# Patient Record
Sex: Male | Born: 1945 | Race: Black or African American | Hispanic: No | Smoking: Never smoker
Health system: Southern US, Community
[De-identification: ages and names within clinical notes are randomized; demographics above are authoritative.]

## PROBLEM LIST (undated history)

## (undated) DIAGNOSIS — I420 Dilated cardiomyopathy: Secondary | ICD-10-CM

## (undated) DIAGNOSIS — I471 Supraventricular tachycardia, unspecified: Secondary | ICD-10-CM

## (undated) DIAGNOSIS — I493 Ventricular premature depolarization: Secondary | ICD-10-CM

## (undated) DIAGNOSIS — I4891 Unspecified atrial fibrillation: Secondary | ICD-10-CM

## (undated) DIAGNOSIS — I739 Peripheral vascular disease, unspecified: Secondary | ICD-10-CM

## (undated) DIAGNOSIS — E119 Type 2 diabetes mellitus without complications: Secondary | ICD-10-CM

## (undated) DIAGNOSIS — Z95810 Presence of automatic (implantable) cardiac defibrillator: Secondary | ICD-10-CM

## (undated) DIAGNOSIS — M1990 Unspecified osteoarthritis, unspecified site: Secondary | ICD-10-CM

## (undated) DIAGNOSIS — I38 Endocarditis, valve unspecified: Secondary | ICD-10-CM

## (undated) DIAGNOSIS — I509 Heart failure, unspecified: Secondary | ICD-10-CM

## (undated) DIAGNOSIS — I10 Essential (primary) hypertension: Secondary | ICD-10-CM

## (undated) HISTORY — DX: Unspecified atrial fibrillation: I48.91

## (undated) HISTORY — DX: Type 2 diabetes mellitus without complications: E11.9

## (undated) HISTORY — DX: Peripheral vascular disease, unspecified: I73.9

---

## 2006-05-26 ENCOUNTER — Encounter: Admission: RE | Admit: 2006-05-26 | Discharge: 2006-05-26 | Payer: Self-pay | Admitting: Unknown Physician Specialty

## 2006-06-15 ENCOUNTER — Encounter: Admission: RE | Admit: 2006-06-15 | Discharge: 2006-06-15 | Payer: Self-pay | Admitting: Unknown Physician Specialty

## 2012-09-12 ENCOUNTER — Emergency Department (HOSPITAL_COMMUNITY): Payer: Medicare PPO

## 2012-09-12 ENCOUNTER — Other Ambulatory Visit: Payer: Self-pay

## 2012-09-12 ENCOUNTER — Encounter (HOSPITAL_COMMUNITY): Payer: Self-pay | Admitting: Family Medicine

## 2012-09-12 ENCOUNTER — Inpatient Hospital Stay (HOSPITAL_COMMUNITY)
Admission: EM | Admit: 2012-09-12 | Discharge: 2012-10-06 | DRG: 853 | Disposition: A | Discharge status: Skilled Nursing Facility | Payer: Medicare PPO | Attending: Family Medicine | Admitting: Family Medicine

## 2012-09-12 DIAGNOSIS — I5022 Chronic systolic (congestive) heart failure: Secondary | ICD-10-CM

## 2012-09-12 DIAGNOSIS — M7989 Other specified soft tissue disorders: Secondary | ICD-10-CM

## 2012-09-12 DIAGNOSIS — N390 Urinary tract infection, site not specified: Secondary | ICD-10-CM | POA: Diagnosis present

## 2012-09-12 DIAGNOSIS — A419 Sepsis, unspecified organism: Secondary | ICD-10-CM | POA: Diagnosis present

## 2012-09-12 DIAGNOSIS — I319 Disease of pericardium, unspecified: Secondary | ICD-10-CM | POA: Diagnosis present

## 2012-09-12 DIAGNOSIS — I824Z9 Acute embolism and thrombosis of unspecified deep veins of unspecified distal lower extremity: Secondary | ICD-10-CM | POA: Diagnosis present

## 2012-09-12 DIAGNOSIS — I309 Acute pericarditis, unspecified: Secondary | ICD-10-CM | POA: Clinically undetermined

## 2012-09-12 DIAGNOSIS — L03119 Cellulitis of unspecified part of limb: Secondary | ICD-10-CM

## 2012-09-12 DIAGNOSIS — Z79899 Other long term (current) drug therapy: Secondary | ICD-10-CM

## 2012-09-12 DIAGNOSIS — I480 Paroxysmal atrial fibrillation: Secondary | ICD-10-CM

## 2012-09-12 DIAGNOSIS — M5090 Cervical disc disorder, unspecified, unspecified cervical region: Secondary | ICD-10-CM | POA: Diagnosis present

## 2012-09-12 DIAGNOSIS — I739 Peripheral vascular disease, unspecified: Secondary | ICD-10-CM

## 2012-09-12 DIAGNOSIS — R509 Fever, unspecified: Secondary | ICD-10-CM

## 2012-09-12 DIAGNOSIS — D62 Acute posthemorrhagic anemia: Secondary | ICD-10-CM | POA: Diagnosis not present

## 2012-09-12 DIAGNOSIS — I38 Endocarditis, valve unspecified: Secondary | ICD-10-CM

## 2012-09-12 DIAGNOSIS — E876 Hypokalemia: Secondary | ICD-10-CM | POA: Diagnosis not present

## 2012-09-12 DIAGNOSIS — I10 Essential (primary) hypertension: Secondary | ICD-10-CM | POA: Diagnosis present

## 2012-09-12 DIAGNOSIS — A498 Other bacterial infections of unspecified site: Secondary | ICD-10-CM | POA: Diagnosis present

## 2012-09-12 DIAGNOSIS — Z953 Presence of xenogenic heart valve: Secondary | ICD-10-CM | POA: Diagnosis not present

## 2012-09-12 DIAGNOSIS — I340 Nonrheumatic mitral (valve) insufficiency: Secondary | ICD-10-CM

## 2012-09-12 DIAGNOSIS — R7881 Bacteremia: Secondary | ICD-10-CM

## 2012-09-12 DIAGNOSIS — E119 Type 2 diabetes mellitus without complications: Secondary | ICD-10-CM

## 2012-09-12 DIAGNOSIS — I330 Acute and subacute infective endocarditis: Secondary | ICD-10-CM | POA: Diagnosis present

## 2012-09-12 DIAGNOSIS — I5041 Acute combined systolic (congestive) and diastolic (congestive) heart failure: Secondary | ICD-10-CM

## 2012-09-12 DIAGNOSIS — M79609 Pain in unspecified limb: Secondary | ICD-10-CM

## 2012-09-12 DIAGNOSIS — M009 Pyogenic arthritis, unspecified: Secondary | ICD-10-CM | POA: Diagnosis present

## 2012-09-12 DIAGNOSIS — I5043 Acute on chronic combined systolic (congestive) and diastolic (congestive) heart failure: Secondary | ICD-10-CM | POA: Diagnosis present

## 2012-09-12 DIAGNOSIS — I6340 Cerebral infarction due to embolism of unspecified cerebral artery: Secondary | ICD-10-CM | POA: Diagnosis not present

## 2012-09-12 DIAGNOSIS — I639 Cerebral infarction, unspecified: Secondary | ICD-10-CM | POA: Diagnosis not present

## 2012-09-12 DIAGNOSIS — B999 Unspecified infectious disease: Secondary | ICD-10-CM

## 2012-09-12 DIAGNOSIS — G9341 Metabolic encephalopathy: Secondary | ICD-10-CM | POA: Diagnosis present

## 2012-09-12 DIAGNOSIS — I509 Heart failure, unspecified: Secondary | ICD-10-CM | POA: Diagnosis present

## 2012-09-12 DIAGNOSIS — I5031 Acute diastolic (congestive) heart failure: Secondary | ICD-10-CM

## 2012-09-12 DIAGNOSIS — B958 Unspecified staphylococcus as the cause of diseases classified elsewhere: Secondary | ICD-10-CM

## 2012-09-12 DIAGNOSIS — R6520 Severe sepsis without septic shock: Secondary | ICD-10-CM

## 2012-09-12 DIAGNOSIS — Z952 Presence of prosthetic heart valve: Secondary | ICD-10-CM

## 2012-09-12 DIAGNOSIS — N179 Acute kidney failure, unspecified: Secondary | ICD-10-CM | POA: Diagnosis not present

## 2012-09-12 DIAGNOSIS — A4101 Sepsis due to Methicillin susceptible Staphylococcus aureus: Principal | ICD-10-CM | POA: Diagnosis present

## 2012-09-12 DIAGNOSIS — I4891 Unspecified atrial fibrillation: Secondary | ICD-10-CM | POA: Diagnosis present

## 2012-09-12 HISTORY — DX: Essential (primary) hypertension: I10

## 2012-09-12 LAB — URINALYSIS, ROUTINE W REFLEX MICROSCOPIC
Glucose, UA: NEGATIVE mg/dL
Ketones, ur: 15 mg/dL — AB
Nitrite: POSITIVE — AB
Protein, ur: 100 mg/dL — AB
Specific Gravity, Urine: 1.031 — ABNORMAL HIGH (ref 1.005–1.030)
Urobilinogen, UA: 1 mg/dL (ref 0.0–1.0)
pH: 5 (ref 5.0–8.0)

## 2012-09-12 LAB — GRAM STAIN: Special Requests: NORMAL

## 2012-09-12 LAB — URINE MICROSCOPIC-ADD ON

## 2012-09-12 MED ORDER — VANCOMYCIN HCL IN DEXTROSE 1-5 GM/200ML-% IV SOLN
1000.0000 mg | Freq: Once | INTRAVENOUS | Status: AC
  Administered 2012-09-13: 1000 mg via INTRAVENOUS
  Filled 2012-09-12: qty 200

## 2012-09-12 MED ORDER — MORPHINE SULFATE 4 MG/ML IJ SOLN
4.0000 mg | Freq: Once | INTRAMUSCULAR | Status: AC
  Administered 2012-09-12: 4 mg via INTRAVENOUS
  Filled 2012-09-12: qty 1

## 2012-09-12 MED ORDER — PIPERACILLIN-TAZOBACTAM 3.375 G IVPB
3.3750 g | Freq: Once | INTRAVENOUS | Status: AC
  Administered 2012-09-12: 3.375 g via INTRAVENOUS
  Filled 2012-09-12: qty 50

## 2012-09-12 NOTE — Progress Notes (Signed)
VASCULAR LAB PRELIMINARY  ARTERIAL  ABI completed:     RIGHT    LEFT    PRESSURE WAVEFORM  PRESSURE WAVEFORM  BRACHIAL 143 Triphasic  BRACHIAL 133 Triphasic   DP 69 Monophasic ` DP 138 Monophasic   AT   AT    PT 66 Monophasic PT 113 Monophasic   PER   PER    GREAT TOE  NA GREAT TOE  NA    RIGHT LEFT  ABI 0.48 0.97   Duplex of the right lower extremity:  Diffuse mild calcific plaque throughout the femoral and popliteal artery.  Waveforms triphasic.  Significant stenosis at the trifurcation.  Buckley Bradly, RVT 09/12/2012, 6:11 PM

## 2012-09-12 NOTE — ED Notes (Signed)
Return from vascular

## 2012-09-12 NOTE — Consult Note (Signed)
Reason for Consult:right wrist swelling Referring Physician: delowe  Ryan [name] is an 66 y.o. male.  HPI: transferred from outside hospital with right wrist swelling of unknown duration or etiology  Past Medical History  Diagnosis Date  . Hypertension     History reviewed. No pertinent past surgical history.  History reviewed. No pertinent family history.  Social History:  reports that he has never smoked. He does not have any smokeless tobacco history on file. He reports that he does not drink alcohol. His drug history not on file.  Allergies: No Known Allergies  Medications: I have reviewed the patient's current medications. Scheduled:    Results for orders placed during the hospital encounter of 09/12/12 (from the past 48 hour(s))  URINALYSIS, ROUTINE W REFLEX MICROSCOPIC     Status: Abnormal   Collection Time   09/12/12  8:55 PM      Component Value Range Comment   Color, Urine ORANGE (*) YELLOW BIOCHEMICALS MAY BE AFFECTED BY COLOR   APPearance CLOUDY (*) CLEAR    Specific Gravity, Urine 1.031 (*) 1.005 - 1.030    pH 5.0  5.0 - 8.0    Glucose, UA NEGATIVE  NEGATIVE mg/dL    Hgb urine dipstick LARGE (*) NEGATIVE    Bilirubin Urine SMALL (*) NEGATIVE    Ketones, ur 15 (*) NEGATIVE mg/dL    Protein, ur 100 (*) NEGATIVE mg/dL    Urobilinogen, UA 1.0  0.0 - 1.0 mg/dL    Nitrite POSITIVE (*) NEGATIVE    Leukocytes, UA MODERATE (*) NEGATIVE   URINE MICROSCOPIC-ADD ON     Status: Abnormal   Collection Time   09/12/12  8:55 PM      Component Value Range Comment   Squamous Epithelial / LPF RARE  RARE    WBC, UA 11-20  <3 WBC/hpf    RBC / HPF 7-10  <3 RBC/hpf    Bacteria, UA MANY (*) RARE    Casts HYALINE CASTS (*) NEGATIVE GRANULAR CAST    Dg Chest 2 View  09/12/2012  *RADIOLOGY REPORT*  Clinical Data: Difficulty moving right arm and right leg for 4 days.  Circulatory problem.  CHEST - 2 VIEW  Comparison: 09/25/2008.  Findings: Cardiomegaly.  Low volume chest.   Basilar atelectasis. Gaseous distention of the colon is incidentally noted. There is no airspace consolidation.  IMPRESSION: Low volume chest with basilar atelectasis.  Cardiomegaly.   Original Report Authenticated By: Dereck Ligas, M.D.     Review of Systems  All other systems reviewed and are negative.   Blood pressure 136/71, pulse 124, temperature 100.3 F (37.9 C), temperature source Oral, resp. rate 22, SpO2 97.00%. Physical Exam  Constitutional: He is oriented to person, place, and time. He appears well-developed and well-nourished.  HENT:  Head: Normocephalic and atraumatic.  Cardiovascular: Normal rate.   Respiratory: Effort normal.  Musculoskeletal:       Right wrist: He exhibits tenderness and swelling.  Neurological: He is alert and oriented to person, place, and time.  Skin: Skin is warm.  Psychiatric: He has a normal mood and affect. His behavior is normal. Judgment and thought content normal.    Assessment/Plan: As above  Bedside aspiration of right wrist performed  Blood only grossly  Sent for culture and gram stain   Would admit to medicine for probable cellulitis and start IV ABX  Will recheck in am 12/25  Please repeat WBC on 12/25  Would make NPO after midnight in case I and D necessary  tomorrow  Will follow with medicine service call [phone] for questions  Charlotte Crumb A 09/12/2012, 9:39 PM

## 2012-09-12 NOTE — ED Notes (Signed)
Pt transferred here from [facility] for further evaluation of ischemia in right foot and pain in right leg and foot. Heparin bolus given in route and pt on Heparin drip.

## 2012-09-12 NOTE — ED Notes (Signed)
Dr. Delo at bedside. 

## 2012-09-12 NOTE — ED Provider Notes (Signed)
History     CSN: [id number]  Arrival date & time 09/12/12  1605   First MD Initiated Contact with Patient 09/12/12 1607      No chief complaint on file.   (Consider location/radiation/quality/duration/timing/severity/associated sxs/prior treatment) HPI Comments: Patient was sent from [facility] for concerns of an ischemic leg.  He presented there with a two day history of pain.  Was seen at [facility] and they were unable to doppler pulses.  He denies injury or trauma.  No fevers or chills.  He also reports pain and swelling in the right wrist.  Worse with movement, palpation.  The history is provided by the patient.    No past medical history on file.  No past surgical history on file.  No family history on file.  History  Substance Use Topics  . Smoking status: Not on file  . Smokeless tobacco: Not on file  . Alcohol Use: Not on file      Review of Systems  All other systems reviewed and are negative.    Allergies  Review of patient's allergies indicates not on file.  Home Medications  No current outpatient prescriptions on file.  BP 135/69  Temp 101 F (38.3 C) (Oral)  Resp 20  SpO2 96%  Physical Exam  Nursing note and vitals reviewed. Constitutional: He is oriented to person, place, and time. He appears well-developed and well-nourished.  HENT:  Head: Normocephalic and atraumatic.  Mouth/Throat: Oropharynx is clear and moist.  Neck: Normal range of motion. Neck supple.  Cardiovascular: Normal rate and regular rhythm.   No murmur heard. Pulmonary/Chest: Effort normal and breath sounds normal. No respiratory distress. He has no wheezes.  Abdominal: Soft.  Musculoskeletal: Normal range of motion.       The right leg is cool to the touch from the mid-calf down.  I am unable to palpate a DP or PT pulse, but there is a faint DP pulse.  Neurological: He is alert and oriented to person, place, and time.  Skin: Skin is warm and dry.    ED Course  Procedures  (including critical care time)  Labs Reviewed - No data to display No results found.   No diagnosis found.   Date: 09/12/2012  Rate: 126  Rhythm: sinus tachycardia  QRS Axis: normal  Intervals: normal  ST/T Wave abnormalities: normal  Conduction Disutrbances:none  Narrative Interpretation:   Old EKG Reviewed: none available    MDM  The patient was transferred here for concerns of an ischemic right leg.  He was sent from [place] where the stated they were unable to doppler a pulse in the leg.  When he arrived here, he was seen by vascular surgery and arrangements were made for a vascular study.  This revealed stenosis in the artery but no acute thrombus or evidence of a threatened limb.    He was also found to have a red, swollen wrist and fever of 101.  I spoke with orthopedics about this.  Dr. Burney Gauze was called and came to evaluate the patient.  He performed an arthrocentesis of the wrist.  Gram stain revealed pmn's and few gram positives that Dr. Burney Gauze did not feel was representative of a septic joint.    The urine then returned contaminated and diagnostic of a uti.  At this point, I have consulted medicine for admission and treatment of wrist cellulitis and uti.  He was given vanc and zosyn here.        Veryl Speak, MD 09/12/12  2359 

## 2012-09-12 NOTE — ED Notes (Signed)
Pt transported to vascular.  °

## 2012-09-12 NOTE — ED Notes (Signed)
Ryan [name]  From laboratory called to notify of abnormal lab result: WBCs pre pmns.Marland Kitchen.few gram positive cocci in pairs and clusters. Will report to Dr. Stark Jock

## 2012-09-12 NOTE — ED Notes (Signed)
Dr. Trula Slade at pt bedside consulting pt.

## 2012-09-12 NOTE — ED Notes (Signed)
Heparin IV transfusing at 1000 units/hr into 20 Gauge IV site left anticubital

## 2012-09-12 NOTE — Consult Note (Signed)
[facility]      Consult Note  Patient name: Ryan [name] MRN: [id number] DOB: 03-Jul-1946 Sex: male  Consulting Physician:  ER  Reason for Consult:  Chief Complaint  Patient presents with  . Circulatory Problem    HISTORY OF PRESENT ILLNESS: This is a very pleasant 66 yo gentleman who was transferred from [facility] ER for evaluation of a possible ischemic right foot.  The referring institution stated that the patient had been having pain in his right foot since this morning, and that he had sensation deficits in the foot.  A pulse or doppler signal could not be obtained.  There was also mention that the patient had had some form of vascular surgery in [place] 10 years ago.  I was presently immediately upon the patients arrival.  Upon my questioning, the patient stated that the right foot pain had been going on for several days.  He states that he walks with a walker and has done so for a while.  He reports some form of orthopedic surgery many years ago, but never any vascular surgery.  He does not smoke.  He is also complaining of right wrist pain  Past Medical History  Diagnosis Date  . Hypertension     History reviewed. No pertinent past surgical history.  History   Social History  . Marital Status: Married    Spouse Name: N/A    Number of Children: N/A  . Years of Education: N/A   Occupational History  . Not on file.   Social History Main Topics  . Smoking status: Never Smoker   . Smokeless tobacco: Not on file  . Alcohol Use: No  . Drug Use:   . Sexually Active:    Other Topics Concern  . Not on file   Social History Narrative  . No narrative on file    History reviewed. No pertinent family history.  Allergies as of 09/12/2012  . (No Known Allergies)    No current facility-administered medications on file prior to encounter.   No current outpatient prescriptions on file prior to encounter.     REVIEW OF  SYSTEMS: Cardiovascular: No chest pain, chest pressure, palpitations, orthopnea, or dyspnea on exertion.  No history of DVT or phlebitis. Pulmonary: No productive cough, asthma or wheezing. Neurologic: slight numbness to right foot. No dizziness. Hematologic: No bleeding problems or clotting disorders. Musculoskeletal: right wrist pain and swelling. Gastrointestinal: No blood in stool or hematemesis Genitourinary: No dysuria or hematuria. Psychiatric:: No history of major depression. Integumentary: No rashes or ulcers. Constitutional: No fever or chills.  PHYSICAL EXAMINATION: General: The patient appears their stated age.  Vital signs are BP 136/71  Pulse 124  Temp 100.3 F (37.9 C) (Oral)  Resp 22  SpO2 97% Pulmonary: Respirations are non-labored HEENT:  No gross abnormalities Abdomen: Soft and non-tender  Musculoskeletal: right wrist is edematous and erythematous   Neurologic: slight decrease in motor and sensory function of right foot, but he can move his right toes and he does have sensation, although it is slightly less than the left foot.  Both feet are equal in temperature Skin: There are no ulcer or rashes noted. Psychiatric: The patient has normal affect. Cardiovascular: There is a regular rate and rhythm without significant murmur appreciated.  I can doppler a right PT and DP.  He has palpable bilateral femoral and popliteal pulses and left DP  Diagnostic Studies:  RIGHT    LEFT  PRESSURE  WAVEFORM   PRESSURE  WAVEFORM   BRACHIAL  143  Triphasic  BRACHIAL  133  Triphasic   DP  69  Monophasic `  DP  138  Monophasic   AT    AT     PT  66  Monophasic  PT  113  Monophasic   PER    PER     GREAT TOE   NA  GREAT TOE   NA     RIGHT  LEFT   ABI  0.48  0.97   Duplex of the right lower extremity: Diffuse mild calcific plaque throughout the femoral and popliteal artery. Waveforms triphasic. Significant stenosis at the trifurcation.       Assessment:  Right foot  pain Plan: By exam this is not an ischemic foot.  He has never had vascular surgery before.  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  He would benefit from further evaluation of this with a lower extremity angiogram, which I will schedule for Thursday.  He does need further evaluation of his right wrist which looks like gout.  I would recommend admission for further treatment of his right wrist.  I will follow along and plan for angiography on Thursday.  The heparin can be continued, but I do not think it is mandantory.     Eldridge Abrahams, M.D. [facility] [place] Office: [phone] Pager:  [phone]

## 2012-09-13 ENCOUNTER — Inpatient Hospital Stay (HOSPITAL_COMMUNITY): Payer: Medicare PPO

## 2012-09-13 DIAGNOSIS — A4101 Sepsis due to Methicillin susceptible Staphylococcus aureus: Secondary | ICD-10-CM

## 2012-09-13 DIAGNOSIS — R509 Fever, unspecified: Secondary | ICD-10-CM | POA: Diagnosis present

## 2012-09-13 DIAGNOSIS — I739 Peripheral vascular disease, unspecified: Secondary | ICD-10-CM

## 2012-09-13 DIAGNOSIS — M8610 Other acute osteomyelitis, unspecified site: Secondary | ICD-10-CM

## 2012-09-13 DIAGNOSIS — L98499 Non-pressure chronic ulcer of skin of other sites with unspecified severity: Secondary | ICD-10-CM

## 2012-09-13 LAB — CBC
HCT: 28.5 % — ABNORMAL LOW (ref 39.0–52.0)
Hemoglobin: 9.7 g/dL — ABNORMAL LOW (ref 13.0–17.0)
MCH: 22.7 pg — ABNORMAL LOW (ref 26.0–34.0)
MCHC: 34 g/dL (ref 30.0–36.0)
MCV: 66.6 fL — ABNORMAL LOW (ref 78.0–100.0)
Platelets: 158 10*3/uL (ref 150–400)
RBC: 4.28 MIL/uL (ref 4.22–5.81)
RDW: 14.9 % (ref 11.5–15.5)
WBC: 12 10*3/uL — ABNORMAL HIGH (ref 4.0–10.5)

## 2012-09-13 LAB — GLUCOSE, CAPILLARY
Glucose-Capillary: 91 mg/dL (ref 70–99)
Glucose-Capillary: 112 mg/dL — ABNORMAL HIGH (ref 70–99)
Glucose-Capillary: 144 mg/dL — ABNORMAL HIGH (ref 70–99)

## 2012-09-13 LAB — BLOOD GAS, ARTERIAL
Acid-base deficit: 1.1 mmol/L (ref 0.0–2.0)
Bicarbonate: 22.6 mEq/L (ref 20.0–24.0)
Drawn by: 36527
FIO2: 0.21 %
O2 Saturation: 93.5 %
Patient temperature: 101.5
TCO2: 23.6 mmol/L (ref 0–100)
pCO2 arterial: 36.9 mmHg (ref 35.0–45.0)
pH, Arterial: 7.412 (ref 7.350–7.450)
pO2, Arterial: 74 mmHg — ABNORMAL LOW (ref 80.0–100.0)

## 2012-09-13 LAB — COMPREHENSIVE METABOLIC PANEL
ALT: 57 U/L — ABNORMAL HIGH (ref 0–53)
AST: 85 U/L — ABNORMAL HIGH (ref 0–37)
Albumin: 1.9 g/dL — ABNORMAL LOW (ref 3.5–5.2)
Alkaline Phosphatase: 110 U/L (ref 39–117)
BUN: 31 mg/dL — ABNORMAL HIGH (ref 6–23)
CO2: 21 mEq/L (ref 19–32)
Calcium: 8.5 mg/dL (ref 8.4–10.5)
Chloride: 106 mEq/L (ref 96–112)
Creatinine, Ser: 1.17 mg/dL (ref 0.50–1.35)
GFR calc Af Amer: 72 mL/min — ABNORMAL LOW (ref >90)
GFR calc non Af Amer: 62 mL/min — ABNORMAL LOW (ref >90)
Glucose, Bld: 122 mg/dL — ABNORMAL HIGH (ref 70–99)
Potassium: 4.4 mEq/L (ref 3.5–5.1)
Sodium: 138 mEq/L (ref 135–145)
Total Bilirubin: 1.3 mg/dL — ABNORMAL HIGH (ref 0.3–1.2)
Total Protein: 6.3 g/dL (ref 6.0–8.3)

## 2012-09-13 LAB — SALICYLATE LEVEL: Salicylate Lvl: <2 mg/dL — ABNORMAL LOW (ref 2.8–20.0)

## 2012-09-13 LAB — CREATININE, SERUM
Creatinine, Ser: 1.18 mg/dL (ref 0.50–1.35)
GFR calc Af Amer: 71 mL/min — ABNORMAL LOW (ref >90)
GFR calc non Af Amer: 62 mL/min — ABNORMAL LOW (ref >90)

## 2012-09-13 LAB — PROTIME-INR
INR: 1.25 (ref 0.00–1.49)
Prothrombin Time: 15.5 seconds — ABNORMAL HIGH (ref 11.6–15.2)

## 2012-09-13 LAB — ETHANOL: Alcohol, Ethyl (B): <11 mg/dL (ref 0–11)

## 2012-09-13 LAB — RAPID URINE DRUG SCREEN, HOSP PERFORMED
Amphetamines: NOT DETECTED
Barbiturates: NOT DETECTED
Benzodiazepines: NOT DETECTED
Cocaine: NOT DETECTED
Opiates: POSITIVE — AB
Tetrahydrocannabinol: NOT DETECTED

## 2012-09-13 LAB — LACTIC ACID, PLASMA: Lactic Acid, Venous: 1 mmol/L (ref 0.5–2.2)

## 2012-09-13 LAB — ACETAMINOPHEN LEVEL: Acetaminophen (Tylenol), Serum: <15 ug/mL (ref 10–30)

## 2012-09-13 LAB — HEPARIN LEVEL (UNFRACTIONATED)
Heparin Unfractionated: <0.1 IU/mL — ABNORMAL LOW (ref 0.30–0.70)
Heparin Unfractionated: 0.11 IU/mL — ABNORMAL LOW (ref 0.30–0.70)

## 2012-09-13 LAB — AMMONIA: Ammonia: 15 umol/L (ref 11–60)

## 2012-09-13 LAB — CK: Total CK: 2336 U/L — ABNORMAL HIGH (ref 7–232)

## 2012-09-13 LAB — URIC ACID: Uric Acid, Serum: 6.7 mg/dL (ref 4.0–7.8)

## 2012-09-13 LAB — MRSA PCR SCREENING: MRSA by PCR: NEGATIVE

## 2012-09-13 LAB — APTT: aPTT: 40 seconds — ABNORMAL HIGH (ref 24–37)

## 2012-09-13 LAB — SEDIMENTATION RATE: Sed Rate: 77 mm/hr — ABNORMAL HIGH (ref 0–16)

## 2012-09-13 MED ORDER — POLYETHYLENE GLYCOL 3350 17 G PO PACK
17.0000 g | PACK | Freq: Every day | ORAL | Status: DC | PRN
  Administered 2012-09-14: 17 g via ORAL
  Filled 2012-09-13 (×2): qty 1

## 2012-09-13 MED ORDER — HEPARIN (PORCINE) IN NACL 100-0.45 UNIT/ML-% IJ SOLN
1950.0000 [IU]/h | INTRAMUSCULAR | Status: DC
  Administered 2012-09-13: 1700 [IU]/h via INTRAVENOUS
  Administered 2012-09-13: 1400 [IU]/h via INTRAVENOUS
  Administered 2012-09-13: 1200 [IU]/h via INTRAVENOUS
  Administered 2012-09-14: 1950 [IU]/h via INTRAVENOUS
  Administered 2012-09-14: 1800 [IU]/h via INTRAVENOUS
  Filled 2012-09-13 (×6): qty 250

## 2012-09-13 MED ORDER — LORAZEPAM 2 MG/ML IJ SOLN: 1.0000 mg | Freq: Four times a day (QID) | INTRAMUSCULAR | Status: AC | PRN

## 2012-09-13 MED ORDER — DEXTROSE-NACL 5-0.45 % IV SOLN
INTRAVENOUS | Status: DC
  Administered 2012-09-13 – 2012-09-14 (×3): via INTRAVENOUS

## 2012-09-13 MED ORDER — SODIUM CHLORIDE 0.9 % IV SOLN: INTRAVENOUS | Status: DC

## 2012-09-13 MED ORDER — LORAZEPAM 1 MG PO TABS: 1.0000 mg | ORAL_TABLET | Freq: Four times a day (QID) | ORAL | Status: AC | PRN

## 2012-09-13 MED ORDER — PIPERACILLIN-TAZOBACTAM 3.375 G IVPB
3.3750 g | Freq: Three times a day (TID) | INTRAVENOUS | Status: DC
  Administered 2012-09-13 – 2012-09-15 (×6): 3.375 g via INTRAVENOUS
  Filled 2012-09-13 (×10): qty 50

## 2012-09-13 MED ORDER — VANCOMYCIN HCL 1000 MG IV SOLR
750.0000 mg | Freq: Two times a day (BID) | INTRAVENOUS | Status: DC
  Administered 2012-09-13 – 2012-09-14 (×4): 750 mg via INTRAVENOUS
  Filled 2012-09-13 (×5): qty 750

## 2012-09-13 MED ORDER — HEPARIN SODIUM (PORCINE) 5000 UNIT/ML IJ SOLN
5000.0000 [IU] | Freq: Three times a day (TID) | INTRAMUSCULAR | Status: DC
  Filled 2012-09-13 (×2): qty 1

## 2012-09-13 MED ORDER — ACETAMINOPHEN 325 MG PO TABS
650.0000 mg | ORAL_TABLET | Freq: Four times a day (QID) | ORAL | Status: DC | PRN
  Administered 2012-09-13: 650 mg via ORAL
  Filled 2012-09-13: qty 2

## 2012-09-13 MED ORDER — ACETAMINOPHEN 650 MG RE SUPP
650.0000 mg | Freq: Four times a day (QID) | RECTAL | Status: DC | PRN
  Administered 2012-09-13: 650 mg via RECTAL
  Filled 2012-09-13: qty 1

## 2012-09-13 MED ORDER — SODIUM CHLORIDE 0.9 % IV SOLN
INTRAVENOUS | Status: DC
  Administered 2012-09-13: 07:00:00 via INTRAVENOUS

## 2012-09-13 MED ORDER — ATORVASTATIN CALCIUM 40 MG PO TABS
40.0000 mg | ORAL_TABLET | Freq: Every day | ORAL | Status: DC
  Administered 2012-09-13 – 2012-10-06 (×23): 40 mg via ORAL
  Filled 2012-09-13 (×27): qty 1

## 2012-09-13 MED ORDER — ONDANSETRON HCL 4 MG/2ML IJ SOLN: 4.0000 mg | Freq: Four times a day (QID) | INTRAMUSCULAR | Status: DC | PRN

## 2012-09-13 MED ORDER — ONDANSETRON HCL 4 MG PO TABS: 4.0000 mg | ORAL_TABLET | Freq: Four times a day (QID) | ORAL | Status: DC | PRN

## 2012-09-13 MED ORDER — SODIUM CHLORIDE 0.9 % IJ SOLN
3.0000 mL | Freq: Two times a day (BID) | INTRAMUSCULAR | Status: DC
  Administered 2012-09-13 – 2012-09-17 (×7): 3 mL via INTRAVENOUS

## 2012-09-13 MED ORDER — VITAMIN B-1 100 MG PO TABS
100.0000 mg | ORAL_TABLET | Freq: Every day | ORAL | Status: DC
  Administered 2012-09-13 – 2012-09-28 (×11): 100 mg via ORAL
  Filled 2012-09-13 (×18): qty 1

## 2012-09-13 MED ORDER — THIAMINE HCL 100 MG/ML IJ SOLN
100.0000 mg | Freq: Every day | INTRAMUSCULAR | Status: DC
  Filled 2012-09-13 (×2): qty 1

## 2012-09-13 MED ORDER — METOPROLOL TARTRATE 1 MG/ML IV SOLN
5.0000 mg | INTRAVENOUS | Status: DC | PRN
  Administered 2012-09-16: 5 mg via INTRAVENOUS

## 2012-09-13 MED ORDER — SODIUM CHLORIDE 0.9 % IV BOLUS (SEPSIS)
1000.0000 mL | Freq: Once | INTRAVENOUS | Status: AC
  Administered 2012-09-13: 1000 mL via INTRAVENOUS

## 2012-09-13 MED ORDER — SODIUM CHLORIDE 0.9 % IV BOLUS (SEPSIS)
1000.0000 mL | Freq: Once | INTRAVENOUS | Status: AC | PRN
  Administered 2012-09-13: 1000 mL via INTRAVENOUS

## 2012-09-13 MED ORDER — HEPARIN BOLUS VIA INFUSION
2000.0000 [IU] | Freq: Once | INTRAVENOUS | Status: AC
  Administered 2012-09-13: 2000 [IU] via INTRAVENOUS
  Filled 2012-09-13: qty 2000

## 2012-09-13 MED ORDER — ADULT MULTIVITAMIN W/MINERALS CH
1.0000 | ORAL_TABLET | Freq: Every day | ORAL | Status: DC
  Administered 2012-09-13 – 2012-09-25 (×10): 1 via ORAL
  Filled 2012-09-13 (×14): qty 1

## 2012-09-13 MED ORDER — HYDROCODONE-ACETAMINOPHEN 5-325 MG PO TABS
1.0000 | ORAL_TABLET | ORAL | Status: DC | PRN
Start: 2012-09-13 — End: 2012-09-26
  Administered 2012-09-14: 1 via ORAL
  Administered 2012-09-14 – 2012-09-22 (×20): 2 via ORAL
  Administered 2012-09-23: 1 via ORAL
  Administered 2012-09-23 – 2012-09-25 (×8): 2 via ORAL
  Filled 2012-09-13 (×15): qty 2
  Filled 2012-09-13: qty 1
  Filled 2012-09-13 (×15): qty 2

## 2012-09-13 MED ORDER — FOLIC ACID 1 MG PO TABS
1.0000 mg | ORAL_TABLET | Freq: Every day | ORAL | Status: DC
  Administered 2012-09-13 – 2012-09-28 (×12): 1 mg via ORAL
  Filled 2012-09-13 (×18): qty 1

## 2012-09-13 NOTE — Progress Notes (Signed)
ANTICOAGULATION CONSULT NOTE - Follow Up Consult  Pharmacy Consult for heparin Indication: PAD/?VTE   Labs:  Basename 09/13/12 2242 09/13/12 1234 09/13/12 0856 09/13/12 0847  HGB -- -- -- 9.7*  HCT -- -- -- 28.5*  PLT -- -- -- 158  APTT -- -- -- 40*  LABPROT -- -- -- 15.5*  INR -- -- -- 1.25  HEPARINUNFRC 0.11* <0.10* -- --  CREATININE -- -- 1.17 1.18  CKTOTAL -- 2336* -- --  CKMB -- -- -- --  TROPONINI -- -- -- --    Assessment: 66yo male remains subtherapeutic on heparin after rate increase.  Goal of Therapy:  Heparin level 0.3-0.7 units/ml   Plan:  Will bolus will 2000 units x1 and increase gtt by 4 units/kg/hr to 1700 units/hr and check level in 6hr.  Rogue Bussing PharmD BCPS 09/13/2012,11:33 PM

## 2012-09-13 NOTE — Progress Notes (Signed)
[place] for Heparin Indication: PAD/?VTE and pyelo/?urosepsis  No Known Allergies  Medical History: Past Medical History  Diagnosis Date  . Hypertension    Assessment: 66yo male sent to ED from OSH for concerns for ischemic leg w/ 2d h/o pain, vascular exam shows chronic vascular dz, now awaiting angiography, to continue heparin for now; also noted to have AMS, to begin IV ABX for pyelonephritis with possible urosepsis.  Per notes SCr 1.4 at OSH, baseline unclear.  Heparin level < 0.10  Goal of Therapy:  Heparin level = 0.3-0.7 Monitor platelets by anticoagulation protocol: Yes   Plan:  1) Increase heparin to 1400 units / hr 2) Follow up 8 hour heparin level  Thank you. Anette Guarneri, PharmD [phone]  09/13/2012,2:46 PM

## 2012-09-13 NOTE — Consult Note (Signed)
Reason for Consult:right foot wound Referring Physician: hospitalists  Ryan [name] is an 66 y.o. male.  HPI: a 66 year old male with a history of having had surgery on the right foot.  He has had a small wound over this area for greater than 5 years.  This wound scabs over it does not drain or cause trouble but is been there for a long time.  He denies numbness tingling or radiating pain.  He is currently been evaluated by the Vascular surgery service and her some concern about the blood supply to the right foot.we are consulted for management of right foot problems.  Past Medical History  Diagnosis Date  . Hypertension     History reviewed. No pertinent past surgical history.  History reviewed. No pertinent family history.  Social History:  reports that he has never smoked. He does not have any smokeless tobacco history on file. He reports that he does not drink alcohol. His drug history not on file.  Allergies: No Known Allergies  Medications: I have reviewed the patient's current medications.  Results for orders placed during the hospital encounter of 09/12/12 (from the past 48 hour(s))  URINALYSIS, ROUTINE W REFLEX MICROSCOPIC     Status: Abnormal   Collection Time   09/12/12  8:55 PM      Component Value Range Comment   Color, Urine ORANGE (*) YELLOW BIOCHEMICALS MAY BE AFFECTED BY COLOR   APPearance CLOUDY (*) CLEAR    Specific Gravity, Urine 1.031 (*) 1.005 - 1.030    pH 5.0  5.0 - 8.0    Glucose, UA NEGATIVE  NEGATIVE mg/dL    Hgb urine dipstick LARGE (*) NEGATIVE    Bilirubin Urine SMALL (*) NEGATIVE    Ketones, ur 15 (*) NEGATIVE mg/dL    Protein, ur 100 (*) NEGATIVE mg/dL    Urobilinogen, UA 1.0  0.0 - 1.0 mg/dL    Nitrite POSITIVE (*) NEGATIVE    Leukocytes, UA MODERATE (*) NEGATIVE   URINE MICROSCOPIC-ADD ON     Status: Abnormal   Collection Time   09/12/12  8:55 PM      Component Value Range Comment   Squamous Epithelial / LPF RARE  RARE    WBC, UA 11-20   <3 WBC/hpf    RBC / HPF 7-10  <3 RBC/hpf    Bacteria, UA MANY (*) RARE    Casts HYALINE CASTS (*) NEGATIVE GRANULAR CAST  GRAM STAIN     Status: Normal   Collection Time   09/12/12  9:36 PM      Component Value Range Comment   Specimen Description SYNOVIAL FLUID RIGHT WRIST      Special Requests Normal      Gram Stain        Value: ABUNDANT WBC PRESENT, PREDOMINANTLY PMN     FEW GRAM POSITIVE COCCI IN PAIRS IN CLUSTERS     Gram Stain Report Called to,Read Back By and Verified With: RN T. MITCHELL 09/12/12 [place] M.   Report Status 09/12/2012 FINAL     BODY FLUID CULTURE     Status: Normal (Preliminary result)   Collection Time   09/12/12  9:36 PM      Component Value Range Comment   Specimen Description SYNOVIAL FLUID RIGHT WRIST      Special Requests Normal      Gram Stain        Value: ABUNDANT WBC PRESENT, PREDOMINANTLY PMN     FEW GRAM POSITIVE COCCI IN PAIRS  IN CLUSTERS Performed at [facility] Gram Stain Report Called to,Read Back By and Verified With: Gram Stain Report Called to,Read Back By and Verified With: RN T.MITCHELL 09/12/12 2[facility] M   Culture NO GROWTH      Report Status PENDING     CULTURE, BLOOD (ROUTINE X 2)     Status: Normal (Preliminary result)   Collection Time   09/12/12 10:45 PM      Component Value Range Comment   Specimen Description BLOOD ARM LEFT      Special Requests BOTTLES DRAWN AEROBIC AND ANAEROBIC 10CC      Culture  Setup Time 09/13/2012 04:15      Culture        Value: GRAM POSITIVE COCCI IN PAIRS     Note: Gram Stain Report Called to,Read Back By and Verified With: WHITNEY DAVIS@1518  ON [id number] BY [facility]   Report Status PENDING     CULTURE, BLOOD (ROUTINE X 2)     Status: Normal (Preliminary result)   Collection Time   09/12/12 10:45 PM      Component Value Range Comment   Specimen Description BLOOD ARM RIGHT      Special Requests BOTTLES DRAWN AEROBIC ONLY 10CC      Culture  Setup Time 09/13/2012 04:15      Culture         Value: GRAM POSITIVE COCCI IN PAIRS     Note: Gram Stain Report Called to,Read Back By and Verified With: WHITNEY DAVIS@1518  ON [id number] BY [place]   Report Status PENDING     MRSA PCR SCREENING     Status: Normal   Collection Time   09/13/12  3:44 AM      Component Value Range Comment   MRSA by PCR NEGATIVE  NEGATIVE   BLOOD GAS, ARTERIAL     Status: Abnormal   Collection Time   09/13/12  4:11 AM      Component Value Range Comment   FIO2 0.21      Delivery systems ROOM AIR      pH, Arterial 7.412  7.350 - 7.450    pCO2 arterial 36.9  35.0 - 45.0 mmHg    pO2, Arterial 74.0 (*) 80.0 - 100.0 mmHg    Bicarbonate 22.6  20.0 - 24.0 mEq/L    TCO2 23.6  0 - 100 mmol/L    Acid-base deficit 1.1  0.0 - 2.0 mmol/L    O2 Saturation 93.5      Patient temperature 101.5      Collection site LEFT RADIAL      Drawn by [phone]      Sample type ARTERIAL DRAW      Allens test (pass/fail) PASS  PASS   URINE RAPID DRUG SCREEN (HOSP PERFORMED)     Status: Abnormal   Collection Time   09/13/12  5:24 AM      Component Value Range Comment   Opiates POSITIVE (*) NONE DETECTED    Cocaine NONE DETECTED  NONE DETECTED    Benzodiazepines NONE DETECTED  NONE DETECTED    Amphetamines NONE DETECTED  NONE DETECTED    Tetrahydrocannabinol NONE DETECTED  NONE DETECTED    Barbiturates NONE DETECTED  NONE DETECTED   GLUCOSE, CAPILLARY     Status: Normal   Collection Time   09/13/12  8:17 AM      Component Value Range Comment   Glucose-Capillary 91  70 - 99 mg/dL    Comment 1 Notify RN  CBC     Status: Abnormal   Collection Time   09/13/12  8:47 AM      Component Value Range Comment   WBC 12.0 (*) 4.0 - 10.5 K/uL    RBC 4.28  4.22 - 5.81 MIL/uL    Hemoglobin 9.7 (*) 13.0 - 17.0 g/dL    HCT 28.5 (*) 39.0 - 52.0 %    MCV 66.6 (*) 78.0 - 100.0 fL    MCH 22.7 (*) 26.0 - 34.0 pg    MCHC 34.0  30.0 - 36.0 g/dL    RDW 14.9  11.5 - 15.5 %    Platelets 158  150 - 400 K/uL   CREATININE, SERUM     Status:  Abnormal   Collection Time   09/13/12  8:47 AM      Component Value Range Comment   Creatinine, Ser 1.18  0.50 - 1.35 mg/dL    GFR calc non Af Amer 62 (*) >90 mL/min    GFR calc Af Amer 71 (*) >90 mL/min   APTT     Status: Abnormal   Collection Time   09/13/12  8:47 AM      Component Value Range Comment   aPTT 40 (*) 24 - 37 seconds   PROTIME-INR     Status: Abnormal   Collection Time   09/13/12  8:47 AM      Component Value Range Comment   Prothrombin Time 15.5 (*) 11.6 - 15.2 seconds    INR 1.25  0.00 - 1.49   ACETAMINOPHEN LEVEL     Status: Normal   Collection Time   09/13/12  8:47 AM      Component Value Range Comment   Acetaminophen (Tylenol), Serum <15.0  10 - 30 ug/mL   SALICYLATE LEVEL     Status: Abnormal   Collection Time   09/13/12  8:47 AM      Component Value Range Comment   Salicylate Lvl 123456 (*) 2.8 - 20.0 mg/dL   ETHANOL     Status: Normal   Collection Time   09/13/12  8:47 AM      Component Value Range Comment   Alcohol, Ethyl (B) <11  0 - 11 mg/dL   AMMONIA     Status: Normal   Collection Time   09/13/12  8:48 AM      Component Value Range Comment   Ammonia 15  11 - 60 umol/L   COMPREHENSIVE METABOLIC PANEL     Status: Abnormal   Collection Time   09/13/12  8:56 AM      Component Value Range Comment   Sodium 138  135 - 145 mEq/L    Potassium 4.4  3.5 - 5.1 mEq/L    Chloride 106  96 - 112 mEq/L    CO2 21  19 - 32 mEq/L    Glucose, Bld 122 (*) 70 - 99 mg/dL    BUN 31 (*) 6 - 23 mg/dL    Creatinine, Ser 1.17  0.50 - 1.35 mg/dL    Calcium 8.5  8.4 - 10.5 mg/dL    Total Protein 6.3  6.0 - 8.3 g/dL    Albumin 1.9 (*) 3.5 - 5.2 g/dL    AST 85 (*) 0 - 37 U/L    ALT 57 (*) 0 - 53 U/L    Alkaline Phosphatase 110  39 - 117 U/L    Total Bilirubin 1.3 (*) 0.3 - 1.2 mg/dL    GFR calc non Af Amer 62 (*) >  90 mL/min    GFR calc Af Amer 72 (*) >90 mL/min   LACTIC ACID, PLASMA     Status: Normal   Collection Time   09/13/12  8:56 AM      Component Value  Range Comment   Lactic Acid, Venous 1.0  0.5 - 2.2 mmol/L   GLUCOSE, CAPILLARY     Status: Abnormal   Collection Time   09/13/12 11:46 AM      Component Value Range Comment   Glucose-Capillary 112 (*) 70 - 99 mg/dL    Comment 1 Notify RN     HEPARIN LEVEL (UNFRACTIONATED)     Status: Abnormal   Collection Time   09/13/12 12:34 PM      Component Value Range Comment   Heparin Unfractionated <0.10 (*) 0.30 - 0.70 IU/mL   SEDIMENTATION RATE     Status: Abnormal   Collection Time   09/13/12 12:34 PM      Component Value Range Comment   Sed Rate 77 (*) 0 - 16 mm/hr   URIC ACID     Status: Normal   Collection Time   09/13/12 12:34 PM      Component Value Range Comment   Uric Acid, Serum 6.7  4.0 - 7.8 mg/dL   CK     Status: Abnormal   Collection Time   09/13/12 12:34 PM      Component Value Range Comment   Total CK 2336 (*) 7 - 232 U/L     Dg Chest 2 View  09/12/2012  *RADIOLOGY REPORT*  Clinical Data: Difficulty moving right arm and right leg for 4 days.  Circulatory problem.  CHEST - 2 VIEW  Comparison: 09/25/2008.  Findings: Cardiomegaly.  Low volume chest.  Basilar atelectasis. Gaseous distention of the colon is incidentally noted. There is no airspace consolidation.  IMPRESSION: Low volume chest with basilar atelectasis.  Cardiomegaly.   Original Report Authenticated By: Dereck Ligas, M.D.    Dg Wrist Complete Right  09/13/2012  *RADIOLOGY REPORT*  Clinical Data: Wrist pain and swelling, altered mental status  RIGHT WRIST - COMPLETE 3+ VIEW  Comparison: Right wrist films of 09/12/2012  Findings: No acute fracture is seen.  The radiocarpal joint space is minimally narrowed.  The carpal bones are in normal position. The lucency involving the tip of the ulnar styloid is again noted and is of questionable significance.  Some deformity of the distal right fifth metacarpal neck probably is due to prior fracture.  IMPRESSION: No acute fracture.   Original Report Authenticated By: Ivar Drape, M.D.    Dg Foot Complete Right  09/13/2012  *RADIOLOGY REPORT*  Clinical Data: Ulcer  RIGHT FOOT COMPLETE - 3+ VIEW  Comparison: None.  Findings: Three views of the right foot submitted.  Study is limited by diffuse osteopenia.  No acute fracture or subluxation. Postsurgical metallic fixation nails are noted distal aspect first metatarsal.  No periosteal reaction or bony erosion.  Small plantar spur of the calcaneus.  IMPRESSION: No acute fracture or subluxation.  No periosteal reaction or bony erosion.  Diffuse osteopenia.  Postsurgical changes distal aspect first metatarsal.   Original Report Authenticated By: Lahoma Crocker, M.D.     ROS: I have reviewed the patient's review of systems thoroughly and there are no positive responses as relates to the HPI. EXAM: Blood pressure 127/59, pulse 120, temperature 101.3 F (38.5 C), temperature source Oral, resp. rate 23, height 6' (1.829 m), weight 84.4 kg (186 lb 1.1 oz), SpO2 94.00%.  Well-developed well-nourished patient in no acute distress. Alert and oriented x3 HEENT:within normal limits Cardiac: Regular rate and rhythm Pulmonary: Lungs clear to auscultation Abdomen: Soft and nontender.  Normal active bowel sounds  Musculoskeletal: (0.5x1 cm wound over the medial aspect of the right great toe.  There is no erythema.  There is no drainage.  There is mild pain with range of motion of the MTP joint.  There is stiffness of the MtP joint)  Assessment/Plan: Small benign-appearing wound over the medial aspect of the right great toe.  I do not think that this is in anyway related to his septic appearance.  I have some concern that the wound could be over prominent hardware.  I will get an image with a radiographic marker over the wound to assess its relationship to the hardware.  If the patient is going to the operating room for any reason during this hospitalization I would consider hardware removal if the wound is over the hardware.  If the patient  does not go to the operating room I'll see him in the office in 2-3 weeks and we will discuss any treatment options at that point.  I will follow the patient but again I do not think that his septic appearance is in anyway related to this wound on the right great toe.I discussed all this information with the patient and his son.  Sundy Houchins L 09/13/2012, 5:48 PM

## 2012-09-13 NOTE — Progress Notes (Signed)
Patient ID: Ryan [name], male   DOB: May 18, 1946, 66 y.o.   MRN: [id number] Right wrist much less swollen and tender this am at bedside  WBC 12K decreased from yesterday  Doubt right wrist is source of fever and elevated WBC  Patient afebrile this am and WBC is slowly coming down  Would continue IV ABX and will follow with you  Dr. Leanora Cover covering for me from 12/26-12/30

## 2012-09-13 NOTE — H&P (Signed)
[facility] Admission History and Physical  Patient name: Ryan [name] Medical record number: [id number] Date of birth: 02/05/1946 Age: 66 y.o. Gender: male  Primary Care Provider: Default, Provider, MD  Chief Complaint: leg pain and dysuria, R wrist pain   Assessment and Plan: Ryan [name] is a 66 y.o. year old male presenting with likely AMS secondary to Pyelo and possible urosepsis, and possible septic joint vs cellulitis  1. ID: Likely pyelo w/ possible urosepsis, and R wrist infection vs cellulitis. Febrile and tachycardic w/ UTI w/ evidence of infection w/ Gram stain showing G+ cocci in clusters. Ortho tapped joint but do not feel that joint is septic despit finding PMN on tap. Still following - NS bolus 1L  - IVF of NS 150cc/hr - Vanc Zosyn - BCX x2 and UCX - Admit to step down - Appreciate Ortho recs  2. AMS:  Likely secondary to infection. Medication or drug use a possibility though no med rec for pt and pt denies any Etoh or drug use. Family members unsure of any baseline dementia. CN intact and moves extremities symmetrically so stroke unlikely. No h/o seizure though a possibility - ID workup as above - Ammonia level, ABG, Lactic acid - ETOH, UDS, Tylenol, ASA levels - 02 to keep sats >90%  3. PAD/Ischemic limb: concern on admission that LE wre ischemic. Workup performed by vascular in ED which was positive for PAD on ABI testing. No reported claudication by pt. Pain may be referred due to AMS and UTI.  - continue Heparin, will allow vascular to decide when to DC - Will try to obtain home records to assertain previous workups and home meds  4. Renal: AKI. Dehydration vs infection induced vs CKD. CR 1.4 on admission. Unsure of baseline. Producing urine.  - BMET in am  5. MSK: R Wrist/hand pain and swelling. Ortho consulted adn feels that pt does not have a septic joint. Concern for fracture vs clot vs septic joint - ortho following as  above - Plain film of R wrist - +/- dopplers in am if not improving and when able to obtain better history   6. CV: Tachycardic and normotensive. Likely volume depleted and septic. EKG showing sinus tach. - No antihypertensive medications at this time - Will start on Statin   7. FEN/GI: PO status unknown. - NPO after midnight per vascular - swallow study - IVF as above - Strict I/O  8. Prophylaxis:  - Hep drip for possible ischemia from PVD vs clot  8. Disposition: pending further workup    History of Present Illness: HISTORY SOMEWHAT UNRELIABLE DUE TO PTS WAXING AND WANING MENTAL STATUS  Ryan [name] is a 66 y.o. year old male presenting with AMS, LE pain, R wrist pain and swelling, dysuria. Pt presented initially to [facility] where he was worked up for possible LE limb ischemia and was transferred to [facility]. Pt reports LE pain for past 2 wks. States that it is worse when lying down and better w/ elevation. Denies claudication. Dysuria for past 2-4 days. R wrist swelling for past 2 days and denies trauma. Denies CP, SOB, Syncope, lightheadedness, Abd pain, n/v/d/c, HA. Pt w/ h/o neck pain and possible herniated disc so pt wearing soft cervical color at time of admission.   Review Of Systems: Per HPI with all other systems negative  There is no problem list on file for this patient.  Past Medical History: Past Medical History  Diagnosis Date  . Hypertension  Past Surgical History: History reviewed. No pertinent past surgical history.  Social History: History   Social History  . Marital Status: Married    Spouse Name: N/A    Number of Children: N/A  . Years of Education: N/A   Social History Main Topics  . Smoking status: Never Smoker   . Smokeless tobacco: None  . Alcohol Use: No  . Drug Use:   . Sexually Active:    Other Topics Concern  . None   Social History Narrative  . None    Family History: History reviewed. No pertinent family  history.  Allergies: No Known Allergies  Current Facility-Administered Medications  Medication Dose Route Frequency Provider Last Rate Last Dose  . 0.9 %  sodium chloride infusion   Intravenous Continuous Waldemar Dickens, MD      . acetaminophen (TYLENOL) tablet 650 mg  650 mg Oral Q6H PRN Waldemar Dickens, MD       Or  . acetaminophen (TYLENOL) suppository 650 mg  650 mg Rectal Q6H PRN Waldemar Dickens, MD      . heparin injection 5,000 Units  5,000 Units Subcutaneous Q8H Waldemar Dickens, MD      . HYDROcodone-acetaminophen (NORCO/VICODIN) 5-325 MG per tablet 1-2 tablet  1-2 tablet Oral Q4H PRN Waldemar Dickens, MD      . ondansetron [facility]) tablet 4 mg  4 mg Oral Q6H PRN Waldemar Dickens, MD       Or  . ondansetron [facility]) injection 4 mg  4 mg Intravenous Q6H PRN Waldemar Dickens, MD      . polyethylene glycol (MIRALAX / GLYCOLAX) packet 17 g  17 g Oral Daily PRN Waldemar Dickens, MD      . sodium chloride 0.9 % bolus 1,000 mL  1,000 mL Intravenous Once Waldemar Dickens, MD      . sodium chloride 0.9 % injection 3 mL  3 mL Intravenous Q12H Waldemar Dickens, MD         Physical Exam: Filed Vitals:   09/13/12 0130  BP: 142/74  Pulse: 120  Temp: 99.3 F (37.4 C)  Resp: 22   General: moderate distress, AAO to person only  HEENT: dry mucus membranes Heart: Tachycardic, RRR, no murmur Lungs: slightly diminished breath sounds in bases, no wheezes or ronchi. On [place] 2L Abdomen: abdomen is soft without significant tenderness, masses, organomegaly or guarding Extremities: 1+ pulses in LE, 2+ in UE, trace edema in LE Musculoskeletal: R wrist and hand swollen w/ erythema but no induration.  Skin: no rashes, erythema as noted above Neurology: CN intact, Pt will follow simple commands, moves limbs symmetrically  Labs and Imaging: Results for orders placed during the hospital encounter of 09/12/12 (from the past 24 hour(s))  URINALYSIS, ROUTINE W REFLEX MICROSCOPIC     Status: Abnormal    Collection Time   09/12/12  8:55 PM      Component Value Range   Color, Urine ORANGE (*) YELLOW   APPearance CLOUDY (*) CLEAR   Specific Gravity, Urine 1.031 (*) 1.005 - 1.030   pH 5.0  5.0 - 8.0   Glucose, UA NEGATIVE  NEGATIVE mg/dL   Hgb urine dipstick LARGE (*) NEGATIVE   Bilirubin Urine SMALL (*) NEGATIVE   Ketones, ur 15 (*) NEGATIVE mg/dL   Protein, ur 100 (*) NEGATIVE mg/dL   Urobilinogen, UA 1.0  0.0 - 1.0 mg/dL   Nitrite POSITIVE (*) NEGATIVE   Leukocytes, UA MODERATE (*) NEGATIVE  URINE MICROSCOPIC-ADD ON     Status: Abnormal   Collection Time   09/12/12  8:55 PM      Component Value Range   Squamous Epithelial / LPF RARE  RARE   WBC, UA 11-20  <3 WBC/hpf   RBC / HPF 7-10  <3 RBC/hpf   Bacteria, UA MANY (*) RARE   Casts HYALINE CASTS (*) NEGATIVE  GRAM STAIN     Status: Normal   Collection Time   09/12/12  9:36 PM      Component Value Range   Specimen Description SYNOVIAL FLUID RIGHT WRIST     Special Requests Normal     Gram Stain       Value: ABUNDANT WBC PRESENT, PREDOMINANTLY PMN     FEW GRAM POSITIVE COCCI IN PAIRS IN CLUSTERS     Gram Stain Report Called to,Read Back By and Verified With: RN T. MITCHELL 09/12/12 [place]   Report Status 09/12/2012 FINAL    BODY FLUID CULTURE     Status: Normal (Preliminary result)   Collection Time   09/12/12  9:36 PM      Component Value Range   Specimen Description SYNOVIAL FLUID RIGHT WRIST     Special Requests Normal     Gram Stain PENDING     Culture PENDING     Report Status PENDING     LABS FROM OUTSIDE HOSPITAL  WBC: 13.1 Hgb: 11.7 HCT: 36 MCV: 70 Neut: 90%  Gluc: 140 BUN: 36 Cr: 1.4 AST: 73 ALT: 65      Signed: MERRELL, DAVID, M.D. Family Medicine Resident PGY-2 [phone] 09/13/2012 1:44 AM

## 2012-09-13 NOTE — H&P (Signed)
I have seen and examined this patient. I have discussed with Dr Marily Memos.  I agree with their findings and plans as documented in their admission note.  Acute Issues  1. Febrile illness Possible sources of infection -  Open wound right foot dorsum over distal 1st ray with palpable bone.  Reduced ABI of right foot.  No osteomyelitic changes on foot Xray.  - Right wrist inflammatory changes with PMN on arthrocentesis by ortho service.  No CPPD findings on Xray of wrist.  - Pyuria / (+) Nitrite  On Urinalysis C/W Urinary Tract Infection with GPC on gram stain.  Concern for S. Aureus.  - Recommendations:  -Follow up Vascular Surgery consult to see if MRI of right foot to look for evidence of osteomyelitis would be helpful in management decisions around right limb PAD.  - Agree with Vanc/Zosyn BS antibiotics  - Follow up urine Culture and wrist synovial fluid cultures

## 2012-09-13 NOTE — ED Notes (Signed)
Report called/transporting to 2000

## 2012-09-13 NOTE — Progress Notes (Signed)
PGY-1 Daily Progress Note Family [facility] Teaching Service [place] R. Trea Carnegie, DO Service Pager: [phone]   Subjective: Pt feeling pretty well this morning, denies SOB.  Is having right wrist pain.   Objective:  VITALS Temp:  [98.5 F (36.9 C)-101.5 F (38.6 C)] 98.5 F (36.9 C) (12/25 0719) Pulse Rate:  [117-137] 117  (12/25 0719) Resp:  [17-33] 22  (12/25 0719) BP: (113-154)/(63-83) 144/83 mmHg (12/25 0719) SpO2:  [95 %-100 %] 98 % (12/25 0719) Weight:  [186 lb 1.1 oz (84.4 kg)-186 lb 8 oz (84.596 kg)] 186 lb 1.1 oz (84.4 kg) (12/25 0338)  In/Out  Intake/Output Summary (Last 24 hours) at 09/13/12 0942 Last data filed at 09/13/12 0900  Gross per 24 hour  Intake 2365.6 ml  Output    525 ml  Net 1840.6 ml   UOP : 0.1-0.3 mg/kg/hr   Physical Exam: General: NAD, pleasant HEENT:MMM, EOMI B/L, [place]/AT Heart: Tachycardic, RRR, no murmur  Lungs: CTA B/L  Abdomen: abdomen is soft without significant tenderness, masses, organomegaly or guarding  Extremities: 1+ pulses in LE, 2+ in UE, trace edema in LE, + ulcerated pus draining wound R 1st MTP, probable bone  Musculoskeletal: R wrist and hand swollen w/ erythema but no induration, pain with movement or palpation Skin: no rashes, erythema as noted above  Neurology: No focal neurologic deficits    MEDS Scheduled Meds:    . atorvastatin  40 mg Oral q1800  . piperacillin-tazobactam (ZOSYN)  IV  3.375 g Intravenous Q8H  . sodium chloride  3 mL Intravenous Q12H  . vancomycin  750 mg Intravenous Q12H   Continuous Infusions:    . dextrose 5 % and 0.45% NaCl    . heparin 1,200 Units/hr (09/13/12 0900)   PRN Meds:.acetaminophen, acetaminophen, HYDROcodone-acetaminophen, metoprolol, ondansetron (ZOFRAN) IV, ondansetron, polyethylene glycol  Labs and imaging:   CBC  Lab 09/13/12 0847  WBC 12.0*  HGB 9.7*  HCT 28.5*  PLT 158   BMET/CMET No results found for this basename:  NA:3,K:3,CL:3,CO2:3,BUN:3,CREATININE:3,CALCIUM:3,LABALBU:3,PROT:3,BILITOT:3,ALKPHOS:3,ALT:3,AST:3,GLUCOSE:3 in the last 168 hours Results for orders placed during the hospital encounter of 09/12/12 (from the past 24 hour(s))  URINALYSIS, ROUTINE W REFLEX MICROSCOPIC     Status: Abnormal   Collection Time   09/12/12  8:55 PM      Component Value Range   Color, Urine ORANGE (*) YELLOW   APPearance CLOUDY (*) CLEAR   Specific Gravity, Urine 1.031 (*) 1.005 - 1.030   pH 5.0  5.0 - 8.0   Glucose, UA NEGATIVE  NEGATIVE mg/dL   Hgb urine dipstick LARGE (*) NEGATIVE   Bilirubin Urine SMALL (*) NEGATIVE   Ketones, ur 15 (*) NEGATIVE mg/dL   Protein, ur 100 (*) NEGATIVE mg/dL   Urobilinogen, UA 1.0  0.0 - 1.0 mg/dL   Nitrite POSITIVE (*) NEGATIVE   Leukocytes, UA MODERATE (*) NEGATIVE  URINE MICROSCOPIC-ADD ON     Status: Abnormal   Collection Time   09/12/12  8:55 PM      Component Value Range   Squamous Epithelial / LPF RARE  RARE   WBC, UA 11-20  <3 WBC/hpf   RBC / HPF 7-10  <3 RBC/hpf   Bacteria, UA MANY (*) RARE   Casts HYALINE CASTS (*) NEGATIVE  GRAM STAIN     Status: Normal   Collection Time   09/12/12  9:36 PM      Component Value Range   Specimen Description SYNOVIAL FLUID RIGHT WRIST     Special Requests Normal  Gram Stain       Value: ABUNDANT WBC PRESENT, PREDOMINANTLY PMN     FEW GRAM POSITIVE COCCI IN PAIRS IN CLUSTERS     Gram Stain Report Called to,Read Back By and Verified With: RN T. MITCHELL 09/12/12 [place]   Report Status 09/12/2012 FINAL    BODY FLUID CULTURE     Status: Normal (Preliminary result)   Collection Time   09/12/12  9:36 PM      Component Value Range   Specimen Description SYNOVIAL FLUID RIGHT WRIST     Special Requests Normal     Gram Stain PENDING     Culture NO GROWTH     Report Status PENDING    MRSA PCR SCREENING     Status: Normal   Collection Time   09/13/12  3:44 AM      Component Value Range   MRSA by PCR NEGATIVE   NEGATIVE  BLOOD GAS, ARTERIAL     Status: Abnormal   Collection Time   09/13/12  4:11 AM      Component Value Range   FIO2 0.21     Delivery systems ROOM AIR     pH, Arterial 7.412  7.350 - 7.450   pCO2 arterial 36.9  35.0 - 45.0 mmHg   pO2, Arterial 74.0 (*) 80.0 - 100.0 mmHg   Bicarbonate 22.6  20.0 - 24.0 mEq/L   TCO2 23.6  0 - 100 mmol/L   Acid-base deficit 1.1  0.0 - 2.0 mmol/L   O2 Saturation 93.5     Patient temperature 101.5     Collection site LEFT RADIAL     Drawn by [phone]     Sample type ARTERIAL DRAW     Allens test (pass/fail) PASS  PASS  URINE RAPID DRUG SCREEN (HOSP PERFORMED)     Status: Abnormal   Collection Time   09/13/12  5:24 AM      Component Value Range   Opiates POSITIVE (*) NONE DETECTED   Cocaine NONE DETECTED  NONE DETECTED   Benzodiazepines NONE DETECTED  NONE DETECTED   Amphetamines NONE DETECTED  NONE DETECTED   Tetrahydrocannabinol NONE DETECTED  NONE DETECTED   Barbiturates NONE DETECTED  NONE DETECTED  GLUCOSE, CAPILLARY     Status: Normal   Collection Time   09/13/12  8:17 AM      Component Value Range   Glucose-Capillary 91  70 - 99 mg/dL   Comment 1 Notify RN    CBC     Status: Abnormal   Collection Time   09/13/12  8:47 AM      Component Value Range   WBC 12.0 (*) 4.0 - 10.5 K/uL   RBC 4.28  4.22 - 5.81 MIL/uL   Hemoglobin 9.7 (*) 13.0 - 17.0 g/dL   HCT 28.5 (*) 39.0 - 52.0 %   MCV 66.6 (*) 78.0 - 100.0 fL   MCH 22.7 (*) 26.0 - 34.0 pg   MCHC 34.0  30.0 - 36.0 g/dL   RDW 14.9  11.5 - 15.5 %   Platelets 158  150 - 400 K/uL  APTT     Status: Abnormal   Collection Time   09/13/12  8:47 AM      Component Value Range   aPTT 40 (*) 24 - 37 seconds  PROTIME-INR     Status: Abnormal   Collection Time   09/13/12  8:47 AM      Component Value Range   Prothrombin Time 15.5 (*)  11.6 - 15.2 seconds   INR 1.25  0.00 - 1.49   Dg Chest 2 View  09/12/2012  *RADIOLOGY REPORT*  Clinical Data: Difficulty moving right arm and right leg for  4 days.  Circulatory problem.  CHEST - 2 VIEW  Comparison: 09/25/2008.  Findings: Cardiomegaly.  Low volume chest.  Basilar atelectasis. Gaseous distention of the colon is incidentally noted. There is no airspace consolidation.  IMPRESSION: Low volume chest with basilar atelectasis.  Cardiomegaly.   Original Report Authenticated By: Dereck Ligas, M.D.    Dg Wrist Complete Right  09/13/2012  *RADIOLOGY REPORT*  Clinical Data: Wrist pain and swelling, altered mental status  RIGHT WRIST - COMPLETE 3+ VIEW  Comparison: Right wrist films of 09/12/2012  Findings: No acute fracture is seen.  The radiocarpal joint space is minimally narrowed.  The carpal bones are in normal position. The lucency involving the tip of the ulnar styloid is again noted and is of questionable significance.  Some deformity of the distal right fifth metacarpal neck probably is due to prior fracture.  IMPRESSION: No acute fracture.   Original Report Authenticated By: Ivar Drape, M.D.         Assessment/Plan  Ryan [name] is a 66 y.o. year old male presenting with likely AMS secondary to Pyelo and possible urosepsis, and possible septic joint vs cellulitis   1. Gram + Cocci UTI, Osteomyelitis, Sepsis - Pt found to be tachycardic, tachypnic, UA for many baceteria and gram stain + for gram + cocci in clusters   1) BCx, UCx pending.  Right wrist film does not show acute osseous changes.  Will get Uric Acid for possible Gout.  Right wrist aspirate negative for bacteria or crystals   2) Pt has open ulcer R 1st MTP could be seeding his UTI.  Will get R foot film and talk to ortho again to evaluate for possible debridement vs OR management.  Wound Cx pending.  Appreciate Ortho Recs, will continue to follow  3) Continue Vanc Zosyn combination  4) Pt continues to be tachycardic but retaining fluid.  Will switch to D5 1/2 NS as pt is NPO.  Monitor fluid status closely.   5) Continue to monitor CBC daily, will get ESR daily as well to  trend possible response to ABx.   2. Acute Delirium: Likely secondary to infection. Has resolved now.  AAO x 4  1) UDS + for Opiates.  Did receive in ED.  ABG normal.    2) Ammonia pending lactic acid pending acetaminophen pending, ASA pending, EtOH pending   3) O2 to keep sats >90%   3. PAD/Ischemic limb: concern on admission that LE wre ischemic. Workup performed by vascular in ED which was positive for PAD on ABI testing. No reported claudication by pt. Pain may be referred due to AMS and UTI.   1)continue Heparin, will allow vascular to decide when to DC   2) Will be going for arteriogram tomorrow   4.  Acute Kidney Injury. Dehydration vs infection induced vs CKD. CR 1.4 on admission. Unsure of baseline. Producing urine, continue to monitor   1) BMET pending this AM   5. MSK: R Wrist/hand pain and swelling. Ortho consulted adn feels that pt does not have a septic joint.   1) ortho following as above   2) Plain film of R wrist negative for fx  3) +/- dopplers in am if not improving  6. Elevated Transaminases - Pt AST:ALT 2:1 consistent with alcoholic picture. EtOH  on admission < 11  1) Will place on CIWA   7. FEN/GI: PO status unknown.   1) NPO after midnight per vascular   2) swallow study after   3) D5 1/2 NS @ 125 cc/hr   4) Strict I/O   8. Prophylaxis:   1) Hep drip for possible ischemia from PVD vs clot   9. Disposition: pending further workup  Ryan [name] R. Awanda Mink, DO of [facility] 09/13/2012, 9:56 AM

## 2012-09-13 NOTE — Progress Notes (Signed)
I have seen and examined this patient with Dr Awanda Mink.  I agree with their findings and plans as documented in their progress note.

## 2012-09-13 NOTE — Progress Notes (Signed)
ANTICOAGULATION CONSULT NOTE - Initial Consult  Pharmacy Consult for heparin and Vancocin/Zosyn Indication: PAD/?VTE and pyelo/?urosepsis  No Known Allergies  Patient Measurements: Height: 6' (182.9 cm) Weight: 186 lb 8 oz (84.596 kg) IBW/kg (Calculated) : 77.6   Vital Signs: Temp: 99.3 F (37.4 C) (12/25 0130) Temp src: Oral (12/25 0130) BP: 142/74 mmHg (12/25 0130) Pulse Rate: 120  (12/25 0130)   Medical History: Past Medical History  Diagnosis Date  . Hypertension     Medications:  Prescriptions prior to admission  Medication Sig Dispense Refill  . PRESCRIPTION MEDICATION Take 1 tablet by mouth daily. Blood pressure medication       Scheduled:    . atorvastatin  40 mg Oral q1800  . [COMPLETED]  morphine injection  4 mg Intravenous Once  . [COMPLETED]  morphine injection  4 mg Intravenous Once  . [COMPLETED] piperacillin-tazobactam (ZOSYN)  IV  3.375 g Intravenous Once  . sodium chloride  1,000 mL Intravenous Once  . sodium chloride  3 mL Intravenous Q12H  . [COMPLETED] vancomycin  1,000 mg Intravenous Once  . [DISCONTINUED] sodium chloride   Intravenous STAT  . [DISCONTINUED] heparin  5,000 Units Subcutaneous Q8H   Infusions:    . sodium chloride      Assessment: 66yo male sent to ED from OSH for concerns for ischemic leg w/ 2d h/o pain, vascular exam shows chronic vascular dz, now awaiting angiography, to continue heparin for now; also noted to have AMS, to begin IV ABX for pyelonephritis with possible urosepsis.  Per notes SCr 1.4 at OSH, baseline unclear.  Goal of Therapy:  Heparin level 0.3-0.7 units/ml Vancomycin trough 15-20 Monitor platelets by anticoagulation protocol: Yes   Plan:  Pt begun on heparin at OSH at 1000 units/hr but stopped when transferred; will resume gtt at 1200 units/hr and monitor heparin levels and CBC.  Rec'd vanc 1g and Zosyn 3.375g in ED; will continue with vancomycin 750mg  IV Q12H and Zosyn 3.375g IV Q8H and monitor CBC, Cx,  levels prn.  Rogue Bussing  PharmD BCPS 09/13/2012,2:28 AM

## 2012-09-13 NOTE — Progress Notes (Signed)
I performed a bedside swallow eval on the patient.  He tolerated ginger ale and applesauce with no cough, choking, or change in lung sounds.  He is okay for a regular diet.  BOOTH, Boden Stucky 09/13/2012, 4:04 PM

## 2012-09-13 NOTE — Progress Notes (Signed)
[facility] [place]  Subjective  - HD #1  The patient is more somnolent this morning. He is resting, but arousable.   Physical Exam:  The right hand and wrist are significantly improved. Much less erythema. The callus on the right great toe metatarsal head now has an open area. Both feet appear warm. Again he has Doppler signals on the right foot.       Assessment/Plan:    I spent approximately 30 minutes discussing the vascular issues of this patient's care with the wife and son. Given his infectious issues, both from his UTI and right wrist, I would delay any vascular intervention at this time.   since he now has a open wound on his right foot, vascular reconstruction will need to be reconsidered to make sure that he can heal this wound. 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 This will need to be further evaluated with angiography. He will potentially be a endovascular candidate, but that will not be able to be determined until his angiogram. I was originally planning to do his arteriogram tomorrow, however with his infectious issues this has been canceled. I will continue to follow him and monitor his wound  If he is able to heal the wound, obviously vascular reconstruction would not be required. Omarii Scalzo IV, V. WELLS 09/13/2012 2:44 PM --  Filed Vitals:   09/13/12 1147  BP:   Pulse:   Temp: 100.5 F (38.1 C)  Resp:     Intake/Output Summary (Last 24 hours) at 09/13/12 1444 Last data filed at 09/13/12 1148  Gross per 24 hour  Intake 2365.6 ml  Output   1150 ml  Net 1215.6 ml     Laboratory CBC    Component Value Date/Time   WBC 12.0* 09/13/2012 0847   HGB 9.7* 09/13/2012 0847   HCT 28.5* 09/13/2012 0847   PLT 158 09/13/2012 0847    BMET    Component Value Date/Time   NA 138 09/13/2012 0856   K 4.4 09/13/2012 0856   CL 106 09/13/2012 0856   CO2 21 09/13/2012 0856   GLUCOSE 122* 09/13/2012 0856   BUN 31* 09/13/2012 0856   CREATININE 1.17 09/13/2012 0856   CALCIUM 8.5 09/13/2012 0856   GFRNONAA 62* 09/13/2012 0856   GFRAA 72* 09/13/2012 0856    COAG Lab Results  Component Value Date   INR 1.25 09/13/2012   No results found for this basename: PTT    Antibiotics Anti-infectives     Start     Dose/Rate Route Frequency Ordered Stop   09/13/12 1200   vancomycin (VANCOCIN) 750 mg in sodium chloride 0.9 % 150 mL IVPB        750 mg 150 mL/hr over 60 Minutes Intravenous Every 12 hours 09/13/12 0409     09/13/12 0800  piperacillin-tazobactam (ZOSYN) IVPB 3.375 g       3.375 g 12.5 mL/hr over 240 Minutes Intravenous Every 8 hours 09/13/12 0409     09/12/12 2300   vancomycin (VANCOCIN) IVPB 1000 mg/200 mL premix        1,000 mg 200 mL/hr over 60 Minutes Intravenous  Once 09/12/12 2253 09/13/12 0128   09/12/12 2300  piperacillin-tazobactam (ZOSYN) IVPB 3.375 g       3.375 g 12.5 mL/hr over 240 Minutes Intravenous  Once 09/12/12 2253 09/13/12 0023           V. Leia Alf, M.D. [facility] [place] Office: [phone] Pager:  [phone]

## 2012-09-14 ENCOUNTER — Encounter (HOSPITAL_COMMUNITY)
Admission: EM | Disposition: A | Discharge status: Skilled Nursing Facility | Payer: Self-pay | Source: Home / Self Care | Attending: Family Medicine

## 2012-09-14 ENCOUNTER — Inpatient Hospital Stay (HOSPITAL_COMMUNITY): Payer: Medicare PPO | Admitting: Anesthesiology

## 2012-09-14 ENCOUNTER — Encounter (HOSPITAL_COMMUNITY): Payer: Self-pay | Admitting: Anesthesiology

## 2012-09-14 ENCOUNTER — Inpatient Hospital Stay (HOSPITAL_COMMUNITY): Payer: Medicare PPO

## 2012-09-14 DIAGNOSIS — R509 Fever, unspecified: Secondary | ICD-10-CM

## 2012-09-14 HISTORY — PX: I & D EXTREMITY: SHX5045

## 2012-09-14 HISTORY — PX: EXTREMITY WIRE/PIN REMOVAL: SHX5051

## 2012-09-14 LAB — URINE CULTURE: Colony Count: >=100000

## 2012-09-14 LAB — CBC
HCT: 27.2 % — ABNORMAL LOW (ref 39.0–52.0)
Hemoglobin: 9.4 g/dL — ABNORMAL LOW (ref 13.0–17.0)
MCH: 22.3 pg — ABNORMAL LOW (ref 26.0–34.0)
MCHC: 34.6 g/dL (ref 30.0–36.0)
MCV: 64.6 fL — ABNORMAL LOW (ref 78.0–100.0)
Platelets: 184 10*3/uL (ref 150–400)
RBC: 4.21 MIL/uL — ABNORMAL LOW (ref 4.22–5.81)
RDW: 14.8 % (ref 11.5–15.5)
WBC: 12.8 10*3/uL — ABNORMAL HIGH (ref 4.0–10.5)

## 2012-09-14 LAB — BASIC METABOLIC PANEL
BUN: 20 mg/dL (ref 6–23)
CO2: 20 mEq/L (ref 19–32)
Calcium: 8.3 mg/dL — ABNORMAL LOW (ref 8.4–10.5)
Chloride: 102 mEq/L (ref 96–112)
Creatinine, Ser: 1.04 mg/dL (ref 0.50–1.35)
GFR calc Af Amer: 83 mL/min — ABNORMAL LOW (ref >90)
GFR calc non Af Amer: 72 mL/min — ABNORMAL LOW (ref >90)
Glucose, Bld: 96 mg/dL (ref 70–99)
Potassium: 4.4 mEq/L (ref 3.5–5.1)
Sodium: 134 mEq/L — ABNORMAL LOW (ref 135–145)

## 2012-09-14 LAB — HIV ANTIBODY (ROUTINE TESTING W REFLEX): HIV: NONREACTIVE

## 2012-09-14 LAB — SEDIMENTATION RATE: Sed Rate: 81 mm/hr — ABNORMAL HIGH (ref 0–16)

## 2012-09-14 LAB — GLUCOSE, CAPILLARY
Glucose-Capillary: 102 mg/dL — ABNORMAL HIGH (ref 70–99)
Glucose-Capillary: 135 mg/dL — ABNORMAL HIGH (ref 70–99)

## 2012-09-14 LAB — HEPARIN LEVEL (UNFRACTIONATED)
Heparin Unfractionated: 0.15 IU/mL — ABNORMAL LOW (ref 0.30–0.70)
Heparin Unfractionated: 0.27 IU/mL — ABNORMAL LOW (ref 0.30–0.70)

## 2012-09-14 LAB — HEMOGLOBIN A1C
Hgb A1c MFr Bld: 6.5 % — ABNORMAL HIGH (ref <5.7)
Mean Plasma Glucose: 140 mg/dL — ABNORMAL HIGH (ref <117)

## 2012-09-14 SURGERY — IRRIGATION AND DEBRIDEMENT EXTREMITY
Anesthesia: General | Site: Wrist | Laterality: Right | Wound class: Clean

## 2012-09-14 MED ORDER — 0.9 % SODIUM CHLORIDE (POUR BTL) OPTIME
TOPICAL | Status: DC | PRN
  Administered 2012-09-14: 1000 mL

## 2012-09-14 MED ORDER — FENTANYL CITRATE 0.05 MG/ML IJ SOLN
INTRAMUSCULAR | Status: DC | PRN
  Administered 2012-09-14 (×5): 50 ug via INTRAVENOUS

## 2012-09-14 MED ORDER — HEPARIN BOLUS VIA INFUSION
2000.0000 [IU] | Freq: Once | INTRAVENOUS | Status: AC
  Administered 2012-09-14: 2000 [IU] via INTRAVENOUS
  Filled 2012-09-14: qty 2000

## 2012-09-14 MED ORDER — CHLORHEXIDINE GLUCONATE 4 % EX LIQD
60.0000 mL | Freq: Once | CUTANEOUS | Status: AC
  Administered 2012-09-14: 4 via TOPICAL
  Filled 2012-09-14: qty 60

## 2012-09-14 MED ORDER — SODIUM CHLORIDE 0.45 % IV SOLN: INTRAVENOUS | Status: DC | PRN

## 2012-09-14 MED ORDER — BUPIVACAINE HCL (PF) 0.25 % IJ SOLN
INTRAMUSCULAR | Status: DC | PRN
  Administered 2012-09-14: 10 mL

## 2012-09-14 MED ORDER — HYDROMORPHONE HCL PF 1 MG/ML IJ SOLN
INTRAMUSCULAR | Status: AC
  Administered 2012-09-14: 0.25 mg via INTRAVENOUS
  Filled 2012-09-14: qty 1

## 2012-09-14 MED ORDER — PROPRANOLOL HCL 1 MG/ML IV SOLN
INTRAVENOUS | Status: DC | PRN
  Administered 2012-09-14: 1 mg via INTRAVENOUS

## 2012-09-14 MED ORDER — ONDANSETRON HCL 4 MG/2ML IJ SOLN: 4.0000 mg | Freq: Once | INTRAMUSCULAR | Status: DC | PRN

## 2012-09-14 MED ORDER — LIDOCAINE HCL (CARDIAC) 10 MG/ML IV SOLN
INTRAVENOUS | Status: DC | PRN
  Administered 2012-09-14: 100 mg via INTRAVENOUS

## 2012-09-14 MED ORDER — SODIUM CHLORIDE 0.45 % IV SOLN
INTRAVENOUS | Status: DC
  Administered 2012-09-14: 125 mL/h via INTRAVENOUS
  Administered 2012-09-15 – 2012-09-16 (×3): via INTRAVENOUS
  Administered 2012-09-16 – 2012-09-17 (×2): 125 mL/h via INTRAVENOUS
  Administered 2012-09-17: 125 mL via INTRAVENOUS
  Administered 2012-09-18: 02:00:00 via INTRAVENOUS

## 2012-09-14 MED ORDER — BUPIVACAINE HCL (PF) 0.25 % IJ SOLN
INTRAMUSCULAR | Status: AC
  Filled 2012-09-14: qty 30

## 2012-09-14 MED ORDER — PROPOFOL 10 MG/ML IV BOLUS
INTRAVENOUS | Status: DC | PRN
  Administered 2012-09-14: 130 mg via INTRAVENOUS

## 2012-09-14 MED ORDER — HYDROMORPHONE HCL PF 1 MG/ML IJ SOLN
0.2500 mg | INTRAMUSCULAR | Status: DC | PRN
  Administered 2012-09-14: 0.25 mg via INTRAVENOUS

## 2012-09-14 MED ORDER — MORPHINE SULFATE 2 MG/ML IJ SOLN
1.0000 mg | INTRAMUSCULAR | Status: DC | PRN
  Administered 2012-09-14 – 2012-09-25 (×17): 1 mg via INTRAVENOUS
  Filled 2012-09-14 (×18): qty 1

## 2012-09-14 MED ORDER — SODIUM CHLORIDE 0.45 % IV SOLN
INTRAVENOUS | Status: DC | PRN
  Administered 2012-09-14: 300 mL via INTRAVENOUS

## 2012-09-14 SURGICAL SUPPLY — 72 items
BANDAGE COBAN STERILE 2 (GAUZE/BANDAGES/DRESSINGS) IMPLANT
BANDAGE CONFORM 2  STR LF (GAUZE/BANDAGES/DRESSINGS) ×1 IMPLANT
BANDAGE ELASTIC 3 VELCRO ST LF (GAUZE/BANDAGES/DRESSINGS) ×2 IMPLANT
BANDAGE ELASTIC 4 VELCRO ST LF (GAUZE/BANDAGES/DRESSINGS) ×2 IMPLANT
BANDAGE GAUZE ELAST BULKY 4 IN (GAUZE/BANDAGES/DRESSINGS) ×2 IMPLANT
BNDG CMPR 9X4 STRL LF SNTH (GAUZE/BANDAGES/DRESSINGS) ×2
BNDG CMPR MD 5X2 ELC HKLP STRL (GAUZE/BANDAGES/DRESSINGS) ×2
BNDG COHESIVE 1X5 TAN STRL LF (GAUZE/BANDAGES/DRESSINGS) IMPLANT
BNDG ELASTIC 2 VLCR STRL LF (GAUZE/BANDAGES/DRESSINGS) ×1 IMPLANT
BNDG ESMARK 4X9 LF (GAUZE/BANDAGES/DRESSINGS) ×1 IMPLANT
CANNULA VESSEL W/WING WO/VALVE (CANNULA) ×1 IMPLANT
CLOTH BEACON ORANGE TIMEOUT ST (SAFETY) ×3 IMPLANT
CORDS BIPOLAR (ELECTRODE) ×3 IMPLANT
COVER SURGICAL LIGHT HANDLE (MISCELLANEOUS) ×3 IMPLANT
CUFF TOURNIQUET SINGLE 18IN (TOURNIQUET CUFF) ×1 IMPLANT
CUFF TOURNIQUET SINGLE 24IN (TOURNIQUET CUFF) ×1 IMPLANT
DECANTER SPIKE VIAL GLASS SM (MISCELLANEOUS) ×3 IMPLANT
DRAIN PENROSE 1/4X12 LTX STRL (WOUND CARE) IMPLANT
DRAPE OEC MINIVIEW 54X84 (DRAPES) ×1 IMPLANT
DRAPE SURG 17X23 STRL (DRAPES) ×1 IMPLANT
DRSG ADAPTIC 3X8 NADH LF (GAUZE/BANDAGES/DRESSINGS) ×1 IMPLANT
DRSG EMULSION OIL 3X3 NADH (GAUZE/BANDAGES/DRESSINGS) ×2 IMPLANT
DRSG PAD ABDOMINAL 8X10 ST (GAUZE/BANDAGES/DRESSINGS) ×5 IMPLANT
GAUZE PACKING IODOFORM 1/4X5 (PACKING) ×1 IMPLANT
GAUZE SPONGE 2X2 8PLY STRL LF (GAUZE/BANDAGES/DRESSINGS) IMPLANT
GAUZE XEROFORM 1X8 LF (GAUZE/BANDAGES/DRESSINGS) ×2 IMPLANT
GAUZE XEROFORM 5X9 LF (GAUZE/BANDAGES/DRESSINGS) ×1 IMPLANT
GLOVE BIO SURGEON STRL SZ7 (GLOVE) ×1 IMPLANT
GLOVE BIO SURGEON STRL SZ7.5 (GLOVE) ×3 IMPLANT
GLOVE BIOGEL PI IND STRL 7.5 (GLOVE) IMPLANT
GLOVE BIOGEL PI IND STRL 8 (GLOVE) ×2 IMPLANT
GLOVE BIOGEL PI INDICATOR 7.5 (GLOVE) ×2
GLOVE BIOGEL PI INDICATOR 8 (GLOVE) ×2
GLOVE ECLIPSE 7.5 STRL STRAW (GLOVE) ×1 IMPLANT
GOWN STRL NON-REIN LRG LVL3 (GOWN DISPOSABLE) ×1 IMPLANT
GOWN STRL REIN XL XLG (GOWN DISPOSABLE) ×4 IMPLANT
HANDPIECE INTERPULSE COAX TIP (DISPOSABLE)
KIT BASIN OR (CUSTOM PROCEDURE TRAY) ×3 IMPLANT
KIT ROOM TURNOVER OR (KITS) ×3 IMPLANT
LOOP VESSEL MAXI BLUE (MISCELLANEOUS) IMPLANT
LOOP VESSEL MINI RED (MISCELLANEOUS) IMPLANT
MANIFOLD NEPTUNE II (INSTRUMENTS) ×2 IMPLANT
NDL HYPO 25X1 1.5 SAFETY (NEEDLE) IMPLANT
NEEDLE HYPO 25X1 1.5 SAFETY (NEEDLE) ×3 IMPLANT
NS IRRIG 1000ML POUR BTL (IV SOLUTION) ×3 IMPLANT
PACK ORTHO EXTREMITY (CUSTOM PROCEDURE TRAY) ×3 IMPLANT
PAD ARMBOARD 7.5X6 YLW CONV (MISCELLANEOUS) ×6 IMPLANT
PADDING CAST ABS 3INX4YD NS (CAST SUPPLIES) ×1
PADDING CAST ABS COTTON 3X4 (CAST SUPPLIES) IMPLANT
SCRUB BETADINE 4OZ XXX (MISCELLANEOUS) ×2 IMPLANT
SET HNDPC FAN SPRY TIP SCT (DISPOSABLE) IMPLANT
SOLUTION BETADINE 4OZ (MISCELLANEOUS) ×2 IMPLANT
SPLINT PLASTER EXTRA FAST 3X15 (CAST SUPPLIES) ×1
SPLINT PLASTER GYPS XFAST 3X15 (CAST SUPPLIES) IMPLANT
SPONGE GAUZE 2X2 STER 10/PKG (GAUZE/BANDAGES/DRESSINGS) ×1
SPONGE GAUZE 4X4 12PLY (GAUZE/BANDAGES/DRESSINGS) ×3 IMPLANT
SPONGE LAP 18X18 X RAY DECT (DISPOSABLE) ×2 IMPLANT
SPONGE LAP 4X18 X RAY DECT (DISPOSABLE) ×4 IMPLANT
SUCTION FRAZIER TIP 10 FR DISP (SUCTIONS) ×3 IMPLANT
SUT ETHILON 3 0 FSL (SUTURE) ×1 IMPLANT
SUT ETHILON 4 0 PS 2 18 (SUTURE) ×3 IMPLANT
SUT MON AB 5-0 P3 18 (SUTURE) IMPLANT
SWAB COLLECTION DEVICE MRSA (MISCELLANEOUS) ×1 IMPLANT
SYR CONTROL 10ML LL (SYRINGE) IMPLANT
TOWEL OR 17X24 6PK STRL BLUE (TOWEL DISPOSABLE) ×3 IMPLANT
TOWEL OR 17X26 10 PK STRL BLUE (TOWEL DISPOSABLE) ×3 IMPLANT
TUBE ANAEROBIC SPECIMEN COL (MISCELLANEOUS) ×1 IMPLANT
TUBE CONNECTING 12X1/4 (SUCTIONS) ×3 IMPLANT
TUBE FEEDING 5FR 15 INCH (TUBING) IMPLANT
UNDERPAD 30X30 INCONTINENT (UNDERPADS AND DIAPERS) ×3 IMPLANT
WATER STERILE IRR 1000ML POUR (IV SOLUTION) ×3 IMPLANT
YANKAUER SUCT BULB TIP NO VENT (SUCTIONS) ×3 IMPLANT

## 2012-09-14 NOTE — Anesthesia Preprocedure Evaluation (Addendum)
Anesthesia Evaluation  Patient identified by MRN, date of birth, ID band Patient awake    Reviewed: Allergy & Precautions, H&P , NPO status , Patient's Chart, lab work & pertinent test results  Airway Mallampati: I TM Distance: >3 FB Neck ROM: full    Dental   Pulmonary          Cardiovascular hypertension, Rhythm:regular Rate:Normal     Neuro/Psych    GI/Hepatic   Endo/Other    Renal/GU      Musculoskeletal   Abdominal   Peds  Hematology   Anesthesia Other Findings   Reproductive/Obstetrics                           Anesthesia Physical Anesthesia Plan  ASA: II  Anesthesia Plan: General   Post-op Pain Management:    Induction: Intravenous  Airway Management Planned: LMA  Additional Equipment:   Intra-op Plan:   Post-operative Plan: Extubation in OR  Informed Consent: I have reviewed the patients History and Physical, chart, labs and discussed the procedure including the risks, benefits and alternatives for the proposed anesthesia with the patient or authorized representative who has indicated his/her understanding and acceptance.     Plan Discussed with: CRNA, Anesthesiologist and Surgeon  Anesthesia Plan Comments:         Anesthesia Quick Evaluation

## 2012-09-14 NOTE — Progress Notes (Signed)
ANTICOAGULATION CONSULT NOTE - Follow Up Consult  Pharmacy Consult for heparin Indication: PAD/?VTE   Labs:  Basename 09/14/12 0435 09/13/12 2242 09/13/12 1234 09/13/12 0856 09/13/12 0847  HGB 9.4* -- -- -- 9.7*  HCT 27.2* -- -- -- 28.5*  PLT 184 -- -- -- 158  APTT -- -- -- -- 40*  LABPROT -- -- -- -- 15.5*  INR -- -- -- -- 1.25  HEPARINUNFRC 0.15* 0.11* <0.10* -- --  CREATININE -- -- -- 1.17 1.18  CKTOTAL -- -- 2336* -- --  CKMB -- -- -- -- --  TROPONINI -- -- -- -- --    Assessment: 67yo male remains subtherapeutic on heparin after bolus rate increase, very little change in level despite lab being drawn early.  Goal of Therapy:  Heparin level 0.3-0.7 units/ml   Plan:  Will bolus will 2000 units x1 and increase gtt by ~2 units/kg/hr to 1800 units/hr and check level in 6hr.  Rogue Bussing PharmD BCPS 09/14/2012,5:08 AM

## 2012-09-14 NOTE — Consult Note (Signed)
INFECTIOUS DISEASE CONSULT NOTE  Date of Admission:  09/12/2012  Date of Consult:  09/14/2012  Reason for Consult: Staph aureus bacteremia Referring Physician: McDiarmid/Hess  Impression/Recommendation Septic arthritis Staph aureus bacteremia E coli UTI  Would- check MRI or CT foot Check MRI of neck Check TEE Recheck BCx MRI head to f/u his "spots" on his vision  Comment- difficult to know where his infection started but he has the findings consistent with metastatic infection. A work up for endocarditis would be worthwhile, TEE is significantly more sensitive then TTE. Check repeat BCx for clearance.   Thank you so much for this interesting consult,   Bobby Rumpf [id number]  Ryan [name] is an 66 y.o. male.  HPI: 66 yo M with hx HTN and of 2 weeks of leg pain. He then developed dysuria 48h pta and then R wrist swelling. His R wrist was tapped showing GPC, his UCx grew E coli and 2/2 BCx grew S. Aureus (sensi pending). He also has a R foot wound (present for many years after pins placed in 2008) that has been Cx and is growing GPC. His foot has had a callus over it for years and this has recently fallen off.  Also, had steroid injection into his neck 2 weeks ago for neck pain.   ROS- no fever, had chills at home. Has been constipated. No dysuria. Denies trauma to his foot. No headaches, has had blurry vision/spots recently.   Past Medical History  Diagnosis Date  . Hypertension     History reviewed. No pertinent past surgical history.   No Known Allergies  Medications:  Scheduled:   . atorvastatin  40 mg Oral q1800  . folic acid  1 mg Oral Daily  . multivitamin with minerals  1 tablet Oral Daily  . piperacillin-tazobactam (ZOSYN)  IV  3.375 g Intravenous Q8H  . sodium chloride  3 mL Intravenous Q12H  . thiamine  100 mg Oral Daily  . vancomycin  750 mg Intravenous Q12H    Total days of antibiotics 2 (vanco and zosyn)          Social History:  reports that  he has never smoked. He does not have any smokeless tobacco history on file. He reports that he does not drink alcohol. His drug history not on file.  History reviewed. No pertinent family history.  General ROS: see HPI  Blood pressure 125/63, pulse 104, temperature 99.6 F (37.6 C), temperature source Oral, resp. rate 25, height 6' (1.829 m), weight 84.4 kg (186 lb 1.1 oz), SpO2 98.00%. General appearance: alert, cooperative and no distress Eyes: negative findings: pupils equal, round, reactive to light and accomodation Throat: normal findings: oropharynx pink & moist without lesions or evidence of thrush Neck: no adenopathy and supple, symmetrical, trachea midline Lungs: clear to auscultation bilaterally Heart: tachycardia, no murmur heard Abdomen: normal findings: bowel sounds normal and soft, non-tender Extremities: edema none. unable to palpate his pedal pulses. Skin: ~ 1 cm wound over R 1st MT head. increased heat and erythema locally. his R wrist is tender, swollen, erythematous.    Results for orders placed during the hospital encounter of 09/12/12 (from the past 48 hour(s))  URINALYSIS, ROUTINE W REFLEX MICROSCOPIC     Status: Abnormal   Collection Time   09/12/12  8:55 PM      Component Value Range Comment   Color, Urine ORANGE (*) YELLOW BIOCHEMICALS MAY BE AFFECTED BY COLOR   APPearance CLOUDY (*) CLEAR    Specific Gravity,  Urine 1.031 (*) 1.005 - 1.030    pH 5.0  5.0 - 8.0    Glucose, UA NEGATIVE  NEGATIVE mg/dL    Hgb urine dipstick LARGE (*) NEGATIVE    Bilirubin Urine SMALL (*) NEGATIVE    Ketones, ur 15 (*) NEGATIVE mg/dL    Protein, ur 100 (*) NEGATIVE mg/dL    Urobilinogen, UA 1.0  0.0 - 1.0 mg/dL    Nitrite POSITIVE (*) NEGATIVE    Leukocytes, UA MODERATE (*) NEGATIVE   URINE MICROSCOPIC-ADD ON     Status: Abnormal   Collection Time   09/12/12  8:55 PM      Component Value Range Comment   Squamous Epithelial / LPF RARE  RARE    WBC, UA 11-20  <3 WBC/hpf      RBC / HPF 7-10  <3 RBC/hpf    Bacteria, UA MANY (*) RARE    Casts HYALINE CASTS (*) NEGATIVE GRANULAR CAST  URINE CULTURE     Status: Normal (Preliminary result)   Collection Time   09/12/12  8:55 PM      Component Value Range Comment   Specimen Description URINE, CATHETERIZED      Special Requests CX ADDED AT 2127 ON [id number]      Culture  Setup Time 09/12/2012 22:38      Colony Count >=100,000 COLONIES/ML      Culture ESCHERICHIA COLI      Report Status PENDING     GRAM STAIN     Status: Normal   Collection Time   09/12/12  9:36 PM      Component Value Range Comment   Specimen Description SYNOVIAL FLUID RIGHT WRIST      Special Requests Normal      Gram Stain        Value: ABUNDANT WBC PRESENT, PREDOMINANTLY PMN     FEW GRAM POSITIVE COCCI IN PAIRS IN CLUSTERS     Gram Stain Report Called to,Read Back By and Verified With: RN T. MITCHELL 09/12/12 [place]   Report Status 09/12/2012 FINAL     BODY FLUID CULTURE     Status: Normal (Preliminary result)   Collection Time   09/12/12  9:36 PM      Component Value Range Comment   Specimen Description SYNOVIAL FLUID RIGHT WRIST      Special Requests Normal      Gram Stain        Value: ABUNDANT WBC PRESENT, PREDOMINANTLY PMN     FEW GRAM POSITIVE COCCI IN PAIRS     IN CLUSTERS Performed at [facility] Gram Stain Report Called to,Read Back By and Verified With: Gram Stain Report Called to,Read Back By and Verified With: RN T.MITCHELL 09/12/12 2229 [facility] M   Culture STAPHYLOCOCCUS AUREUS      Report Status PENDING     CULTURE, BLOOD (ROUTINE X 2)     Status: Normal (Preliminary result)   Collection Time   09/12/12 10:45 PM      Component Value Range Comment   Specimen Description BLOOD ARM LEFT      Special Requests BOTTLES DRAWN AEROBIC AND ANAEROBIC 10CC      Culture  Setup Time 09/13/2012 04:15      Culture        Value: STAPHYLOCOCCUS AUREUS     Note: RIFAMPIN AND GENTAMICIN SHOULD NOT BE USED AS SINGLE DRUGS  FOR TREATMENT OF STAPH INFECTIONS.     Note: Gram Stain Report Called to,Read Back By  and Verified With: WHITNEY DAVIS@1518  ON [id number] BY [facility]   Report Status PENDING     CULTURE, BLOOD (ROUTINE X 2)     Status: Normal (Preliminary result)   Collection Time   09/12/12 10:45 PM      Component Value Range Comment   Specimen Description BLOOD ARM RIGHT      Special Requests BOTTLES DRAWN AEROBIC ONLY 10CC      Culture  Setup Time 09/13/2012 04:15      Culture        Value: STAPHYLOCOCCUS AUREUS     Note: Gram Stain Report Called to,Read Back By and Verified With: WHITNEY DAVIS@1518  ON [id number] BY [facility]   Report Status PENDING     MRSA PCR SCREENING     Status: Normal   Collection Time   09/13/12  3:44 AM      Component Value Range Comment   MRSA by PCR NEGATIVE  NEGATIVE   BLOOD GAS, ARTERIAL     Status: Abnormal   Collection Time   09/13/12  4:11 AM      Component Value Range Comment   FIO2 0.21      Delivery systems ROOM AIR      pH, Arterial 7.412  7.350 - 7.450    pCO2 arterial 36.9  35.0 - 45.0 mmHg    pO2, Arterial 74.0 (*) 80.0 - 100.0 mmHg    Bicarbonate 22.6  20.0 - 24.0 mEq/L    TCO2 23.6  0 - 100 mmol/L    Acid-base deficit 1.1  0.0 - 2.0 mmol/L    O2 Saturation 93.5      Patient temperature 101.5      Collection site LEFT RADIAL      Drawn by [phone]      Sample type ARTERIAL DRAW      Allens test (pass/fail) PASS  PASS   URINE RAPID DRUG SCREEN (HOSP PERFORMED)     Status: Abnormal   Collection Time   09/13/12  5:24 AM      Component Value Range Comment   Opiates POSITIVE (*) NONE DETECTED    Cocaine NONE DETECTED  NONE DETECTED    Benzodiazepines NONE DETECTED  NONE DETECTED    Amphetamines NONE DETECTED  NONE DETECTED    Tetrahydrocannabinol NONE DETECTED  NONE DETECTED    Barbiturates NONE DETECTED  NONE DETECTED   GLUCOSE, CAPILLARY     Status: Normal   Collection Time   09/13/12  8:17 AM      Component Value Range Comment   Glucose-Capillary 91  70 - 99  mg/dL    Comment 1 Notify RN     CBC     Status: Abnormal   Collection Time   09/13/12  8:47 AM      Component Value Range Comment   WBC 12.0 (*) 4.0 - 10.5 K/uL    RBC 4.28  4.22 - 5.81 MIL/uL    Hemoglobin 9.7 (*) 13.0 - 17.0 g/dL    HCT 28.5 (*) 39.0 - 52.0 %    MCV 66.6 (*) 78.0 - 100.0 fL    MCH 22.7 (*) 26.0 - 34.0 pg    MCHC 34.0  30.0 - 36.0 g/dL    RDW 14.9  11.5 - 15.5 %    Platelets 158  150 - 400 K/uL   CREATININE, SERUM     Status: Abnormal   Collection Time   09/13/12  8:47 AM      Component Value Range Comment  Creatinine, Ser 1.18  0.50 - 1.35 mg/dL    GFR calc non Af Amer 62 (*) >90 mL/min    GFR calc Af Amer 71 (*) >90 mL/min   APTT     Status: Abnormal   Collection Time   09/13/12  8:47 AM      Component Value Range Comment   aPTT 40 (*) 24 - 37 seconds   PROTIME-INR     Status: Abnormal   Collection Time   09/13/12  8:47 AM      Component Value Range Comment   Prothrombin Time 15.5 (*) 11.6 - 15.2 seconds    INR 1.25  0.00 - 1.49   ACETAMINOPHEN LEVEL     Status: Normal   Collection Time   09/13/12  8:47 AM      Component Value Range Comment   Acetaminophen (Tylenol), Serum <15.0  10 - 30 ug/mL   SALICYLATE LEVEL     Status: Abnormal   Collection Time   09/13/12  8:47 AM      Component Value Range Comment   Salicylate Lvl 123456 (*) 2.8 - 20.0 mg/dL   ETHANOL     Status: Normal   Collection Time   09/13/12  8:47 AM      Component Value Range Comment   Alcohol, Ethyl (B) <11  0 - 11 mg/dL   AMMONIA     Status: Normal   Collection Time   09/13/12  8:48 AM      Component Value Range Comment   Ammonia 15  11 - 60 umol/L   COMPREHENSIVE METABOLIC PANEL     Status: Abnormal   Collection Time   09/13/12  8:56 AM      Component Value Range Comment   Sodium 138  135 - 145 mEq/L    Potassium 4.4  3.5 - 5.1 mEq/L    Chloride 106  96 - 112 mEq/L    CO2 21  19 - 32 mEq/L    Glucose, Bld 122 (*) 70 - 99 mg/dL    BUN 31 (*) 6 - 23 mg/dL     Creatinine, Ser 1.17  0.50 - 1.35 mg/dL    Calcium 8.5  8.4 - 10.5 mg/dL    Total Protein 6.3  6.0 - 8.3 g/dL    Albumin 1.9 (*) 3.5 - 5.2 g/dL    AST 85 (*) 0 - 37 U/L    ALT 57 (*) 0 - 53 U/L    Alkaline Phosphatase 110  39 - 117 U/L    Total Bilirubin 1.3 (*) 0.3 - 1.2 mg/dL    GFR calc non Af Amer 62 (*) >90 mL/min    GFR calc Af Amer 72 (*) >90 mL/min   LACTIC ACID, PLASMA     Status: Normal   Collection Time   09/13/12  8:56 AM      Component Value Range Comment   Lactic Acid, Venous 1.0  0.5 - 2.2 mmol/L   WOUND CULTURE     Status: Normal (Preliminary result)   Collection Time   09/13/12  9:12 AM      Component Value Range Comment   Specimen Description WOUND RIGHT FOOT      Special Requests Normal      Gram Stain        Value: NO WBC SEEN     FEW SQUAMOUS EPITHELIAL CELLS PRESENT     RARE GRAM POSITIVE COCCI IN PAIRS   Culture Culture reincubated for better growth  Report Status PENDING     GLUCOSE, CAPILLARY     Status: Abnormal   Collection Time   09/13/12 11:46 AM      Component Value Range Comment   Glucose-Capillary 112 (*) 70 - 99 mg/dL    Comment 1 Notify RN     HEPARIN LEVEL (UNFRACTIONATED)     Status: Abnormal   Collection Time   09/13/12 12:34 PM      Component Value Range Comment   Heparin Unfractionated <0.10 (*) 0.30 - 0.70 IU/mL   SEDIMENTATION RATE     Status: Abnormal   Collection Time   09/13/12 12:34 PM      Component Value Range Comment   Sed Rate 77 (*) 0 - 16 mm/hr   URIC ACID     Status: Normal   Collection Time   09/13/12 12:34 PM      Component Value Range Comment   Uric Acid, Serum 6.7  4.0 - 7.8 mg/dL   CK     Status: Abnormal   Collection Time   09/13/12 12:34 PM      Component Value Range Comment   Total CK 2336 (*) 7 - 232 U/L   GLUCOSE, CAPILLARY     Status: Abnormal   Collection Time   09/13/12 10:03 PM      Component Value Range Comment   Glucose-Capillary 144 (*) 70 - 99 mg/dL    Comment 1 Notify RN      Comment  2 Documented in Chart     HEPARIN LEVEL (UNFRACTIONATED)     Status: Abnormal   Collection Time   09/13/12 10:42 PM      Component Value Range Comment   Heparin Unfractionated 0.11 (*) 0.30 - 0.70 IU/mL   SEDIMENTATION RATE     Status: Abnormal   Collection Time   09/14/12  4:25 AM      Component Value Range Comment   Sed Rate 81 (*) 0 - 16 mm/hr   GLUCOSE, CAPILLARY     Status: Abnormal   Collection Time   09/14/12  4:27 AM      Component Value Range Comment   Glucose-Capillary 102 (*) 70 - 99 mg/dL    Comment 1 Notify RN      Comment 2 Documented in Chart     HEPARIN LEVEL (UNFRACTIONATED)     Status: Abnormal   Collection Time   09/14/12  4:35 AM      Component Value Range Comment   Heparin Unfractionated 0.15 (*) 0.30 - 0.70 IU/mL   CBC     Status: Abnormal   Collection Time   09/14/12  4:35 AM      Component Value Range Comment   WBC 12.8 (*) 4.0 - 10.5 K/uL    RBC 4.21 (*) 4.22 - 5.81 MIL/uL    Hemoglobin 9.4 (*) 13.0 - 17.0 g/dL    HCT 27.2 (*) 39.0 - 52.0 %    MCV 64.6 (*) 78.0 - 100.0 fL    MCH 22.3 (*) 26.0 - 34.0 pg    MCHC 34.6  30.0 - 36.0 g/dL    RDW 14.8  11.5 - 15.5 %    Platelets 184  150 - 400 K/uL   GLUCOSE, CAPILLARY     Status: Abnormal   Collection Time   09/14/12  7:48 AM      Component Value Range Comment   Glucose-Capillary 135 (*) 70 - 99 mg/dL    Comment 1 Documented in Chart  Comment 2 Notify RN         Component Value Date/Time   SDES WOUND RIGHT FOOT 09/13/2012 0912   SPECREQUEST Normal 09/13/2012 0912   CULT Culture reincubated for better growth 09/13/2012 0912   REPTSTATUS PENDING 09/13/2012 0912   Dg Chest 2 View  09/12/2012  *RADIOLOGY REPORT*  Clinical Data: Difficulty moving right arm and right leg for 4 days.  Circulatory problem.  CHEST - 2 VIEW  Comparison: 09/25/2008.  Findings: Cardiomegaly.  Low volume chest.  Basilar atelectasis. Gaseous distention of the colon is incidentally noted. There is no airspace  consolidation.  IMPRESSION: Low volume chest with basilar atelectasis.  Cardiomegaly.   Original Report Authenticated By: Dereck Ligas, M.D.    Dg Wrist Complete Right  09/13/2012  *RADIOLOGY REPORT*  Clinical Data: Wrist pain and swelling, altered mental status  RIGHT WRIST - COMPLETE 3+ VIEW  Comparison: Right wrist films of 09/12/2012  Findings: No acute fracture is seen.  The radiocarpal joint space is minimally narrowed.  The carpal bones are in normal position. The lucency involving the tip of the ulnar styloid is again noted and is of questionable significance.  Some deformity of the distal right fifth metacarpal neck probably is due to prior fracture.  IMPRESSION: No acute fracture.   Original Report Authenticated By: Ivar Drape, M.D.    Dg Foot Complete Right  09/14/2012  *RADIOLOGY REPORT*  Clinical Data: Pain  RIGHT FOOT COMPLETE - 3+ VIEW  Comparison: Yesterday  Findings: A BB has been utilized to mark the scan location of pain. There is corresponds to the area of skin just dorsal to the the wire is transfixed in the distal first metatarsal.  Stable hardware compared with yesterday.  No new fracture.  Degenerative changes at the first metatarsophalangeal joint.  Central erosions involving the first metatarsal head are stable.  No aggressive periosteal reaction.  IMPRESSION: A skin BB marks the location of pain overlying the hardware in the distal first metatarsal.  Stable degenerative and postoperative changes.   Original Report Authenticated By: Marybelle Killings, M.D.    Dg Foot Complete Right  09/13/2012  *RADIOLOGY REPORT*  Clinical Data: Ulcer  RIGHT FOOT COMPLETE - 3+ VIEW  Comparison: None.  Findings: Three views of the right foot submitted.  Study is limited by diffuse osteopenia.  No acute fracture or subluxation. Postsurgical metallic fixation nails are noted distal aspect first metatarsal.  No periosteal reaction or bony erosion.  Small plantar spur of the calcaneus.  IMPRESSION: No  acute fracture or subluxation.  No periosteal reaction or bony erosion.  Diffuse osteopenia.  Postsurgical changes distal aspect first metatarsal.   Original Report Authenticated By: Lahoma Crocker, M.D.    Recent Results (from the past 240 hour(s))  URINE CULTURE     Status: Normal (Preliminary result)   Collection Time   09/12/12  8:55 PM      Component Value Range Status Comment   Specimen Description URINE, CATHETERIZED   Final    Special Requests CX ADDED AT 2127 ON [id number]   Final    Culture  Setup Time 09/12/2012 22:38   Final    Colony Count >=100,000 COLONIES/ML   Final    Culture ESCHERICHIA COLI   Final    Report Status PENDING   Incomplete   GRAM STAIN     Status: Normal   Collection Time   09/12/12  9:36 PM      Component Value Range Status Comment   Specimen Description  SYNOVIAL FLUID RIGHT WRIST   Final    Special Requests Normal   Final    Gram Stain     Final    Value: ABUNDANT WBC PRESENT, PREDOMINANTLY PMN     FEW GRAM POSITIVE COCCI IN PAIRS IN CLUSTERS     Gram Stain Report Called to,Read Back By and Verified With: RN T. MITCHELL 09/12/12 [place]   Report Status 09/12/2012 FINAL   Final   BODY FLUID CULTURE     Status: Normal (Preliminary result)   Collection Time   09/12/12  9:36 PM      Component Value Range Status Comment   Specimen Description SYNOVIAL FLUID RIGHT WRIST   Final    Special Requests Normal   Final    Gram Stain     Final    Value: ABUNDANT WBC PRESENT, PREDOMINANTLY PMN     FEW GRAM POSITIVE COCCI IN PAIRS     IN CLUSTERS Performed at [facility] Gram Stain Report Called to,Read Back By and Verified With: Gram Stain Report Called to,Read Back By and Verified With: RN T.MITCHELL 09/12/12 2229 Jonita Albee M   Culture STAPHYLOCOCCUS AUREUS   Final    Report Status PENDING   Incomplete   CULTURE, BLOOD (ROUTINE X 2)     Status: Normal (Preliminary result)   Collection Time   09/12/12 10:45 PM      Component Value Range Status Comment    Specimen Description BLOOD ARM LEFT   Final    Special Requests BOTTLES DRAWN AEROBIC AND ANAEROBIC 10CC   Final    Culture  Setup Time 09/13/2012 04:15   Final    Culture     Final    Value: STAPHYLOCOCCUS AUREUS     Note: RIFAMPIN AND GENTAMICIN SHOULD NOT BE USED AS SINGLE DRUGS FOR TREATMENT OF STAPH INFECTIONS.     Note: Gram Stain Report Called to,Read Back By and Verified With: WHITNEY DAVIS@1518  ON [id number] BY [facility]   Report Status PENDING   Incomplete   CULTURE, BLOOD (ROUTINE X 2)     Status: Normal (Preliminary result)   Collection Time   09/12/12 10:45 PM      Component Value Range Status Comment   Specimen Description BLOOD ARM RIGHT   Final    Special Requests BOTTLES DRAWN AEROBIC ONLY 10CC   Final    Culture  Setup Time 09/13/2012 04:15   Final    Culture     Final    Value: STAPHYLOCOCCUS AUREUS     Note: Gram Stain Report Called to,Read Back By and Verified With: WHITNEY DAVIS@1518  ON [id number] BY [facility]   Report Status PENDING   Incomplete   MRSA PCR SCREENING     Status: Normal   Collection Time   09/13/12  3:44 AM      Component Value Range Status Comment   MRSA by PCR NEGATIVE  NEGATIVE Final   WOUND CULTURE     Status: Normal (Preliminary result)   Collection Time   09/13/12  9:12 AM      Component Value Range Status Comment   Specimen Description WOUND RIGHT FOOT   Final    Special Requests Normal   Final    Gram Stain     Final    Value: NO WBC SEEN     FEW SQUAMOUS EPITHELIAL CELLS PRESENT     RARE GRAM POSITIVE COCCI IN PAIRS   Culture Culture reincubated for better growth   Final  Report Status PENDING   Incomplete       09/14/2012, 11:56 AM     LOS: 2 days

## 2012-09-14 NOTE — Progress Notes (Signed)
Subjective: Painful wrist and persistent non-healing wound r. Great [place]enon   Objective: Vital signs in last 24 hours: Temp:  [98.8 F (37.1 C)-101.3 F (38.5 C)] 99.6 F (37.6 C) (12/26 1149) Pulse Rate:  [95-120] 107  (12/26 1147) Resp:  [16-26] 26  (12/26 1147) BP: (104-127)/(56-64) 116/56 mmHg (12/26 1147) SpO2:  [91 %-98 %] 91 % (12/26 1147)  Intake/Output from previous day: 12/25 0701 - 12/26 0700 In: 1982 [P.O.:480; I.V.:1302; IV Piggyback:200] Out: 2150 [Urine:2150] Intake/Output this shift: Total I/O In: -  Out: 500 [Urine:500]   Basename 09/14/12 0435 09/13/12 0847  HGB 9.4* 9.7*    Basename 09/14/12 0435 09/13/12 0847  WBC 12.8* 12.0*  RBC 4.21* 4.28  HCT 27.2* 28.5*  PLT 184 158    Basename 09/13/12 0856 09/13/12 0847  NA 138 --  K 4.4 --  CL 106 --  CO2 21 --  BUN 31* --  CREATININE 1.17 1.18  GLUCOSE 122* --  CALCIUM 8.5 --    Basename 09/13/12 0847  LABPT --  INR 1.25    Neurologically intact ABD soft Neurovascular intact Sensation intact distally Intact pulses distally No cellulitis present small open wound over great toe.    X-RAY: shows prominent hardware right below small non-healing wound  Assessment/Plan: Small non-healing open wound r great toe just below prominent hardware// I had a long discussion with patient,wife and Son about trying to remove hardware and allow wound to heal.  They all understand there is risk that the larger wound might not heal and he could need a huge vascular procedure to get it to heal.  He could end up with amputation and they understand this completely.  We will proceeed with hardware removal.   Javien Tesch L 09/14/2012, 1:22 PM

## 2012-09-14 NOTE — Brief Op Note (Signed)
09/12/2012 - 09/14/2012  6:39 PM  PATIENT:  Ryan [name]  66 y.o. male  PRE-OPERATIVE DIAGNOSIS:  infected right wrist  POST-OPERATIVE DIAGNOSIS:  infected right wrist  PROCEDURE:  Procedure(s) (LRB) with comments: IRRIGATION AND DEBRIDEMENT EXTREMITY (Right) REMOVAL K-WIRE/PIN EXTREMITY (Right) - Right Foot  SURGEON:  Surgeon(s) and Role: Panel 1:    * Tennis Must, MD - Primary  Panel 2:    * [name], MD - Primary  PHYSICIAN ASSISTANT:   ASSISTANTS: none   ANESTHESIA:   general  EBL:  Total I/O In: 158 [I.V.:108; IV Piggyback:50] Out: 530 [Urine:500; Blood:30]  BLOOD ADMINISTERED:none  DRAINS: none   LOCAL MEDICATIONS USED:  NONE  SPECIMEN:  No Specimen  DISPOSITION OF SPECIMEN:  N/A  COUNTS:  YES  TOURNIQUET:   Total Tourniquet Time Documented: Calf (Right) - 11 minutes Upper Arm (Right) - 56 minutes  DICTATION: .Other Dictation: Dictation Number [id number]  PLAN OF CARE: Admit to inpatient   PATIENT DISPOSITION:  PACU - hemodynamically stable.   Delay start of Pharmacological VTE agent (>24hrs) due to surgical blood loss or risk of bleeding: no

## 2012-09-14 NOTE — Progress Notes (Signed)
Back from surgery.  No complaints.  RT arm and lt leg with ace wraps, no drainage.

## 2012-09-14 NOTE — Progress Notes (Signed)
Utilization Review Completed.   Vianney Kopecky, RN, BSN Nurse Case Manager  [phone]  

## 2012-09-14 NOTE — Progress Notes (Signed)
I have seen and examined this patient. I have discussed with Dr Awanda Mink.  I agree with their findings and plans as documented in their progress note.  Acute Issues 1. Staph aureus Bacteremia  - 2/2 bottles 2. Right wrist septic joint or soft tissue infection - Synovial fluid culture: S. Aureus. 3. Right foot dorsum wound - Palpable bone  3. E. Coli UTI  Plan: Day #2 Vanc/Zosyn- Continue Follow up Culture sensitivities urine, synovial fluid, blood cxs. MRI of right foot to look for evidence of bony and deep tissue infection.  MRI of neck given recent "neck injection" MRI of Brain given 'spots in vision" report.  Dr Fredna Dow taking patient to OR for I&D right wrist. Dr Berenice Primas taking patient to OR for right first metatarsal hardware removal

## 2012-09-14 NOTE — Progress Notes (Signed)
Subjective: Overall feels relatively well.  Right wrist sore and throbbing.  More pain today than yesterday.   Objective: Vital signs in last 24 hours: Temp:  [98.8 F (37.1 C)-101.3 F (38.5 C)] 99.7 F (37.6 C) (12/26 0738) Pulse Rate:  [95-120] 100  (12/26 0400) Resp:  [16-24] 16  (12/26 0400) BP: (104-127)/(59-64) 120/64 mmHg (12/26 0400) SpO2:  [91 %-98 %] 98 % (12/26 0400)  Intake/Output from previous day: 12/25 0701 - 12/26 0700 In: 1982 [P.O.:480; I.V.:1302; IV Piggyback:200] Out: 2150 [Urine:2150] Intake/Output this shift: Total I/O In: -  Out: 200 [Urine:200]   Basename 09/14/12 0435 09/13/12 0847  HGB 9.4* 9.7*    Basename 09/14/12 0435 09/13/12 0847  WBC 12.8* 12.0*  RBC 4.21* 4.28  HCT 27.2* 28.5*  PLT 184 158    Basename 09/13/12 0856 09/13/12 0847  NA 138 --  K 4.4 --  CL 106 --  CO2 21 --  BUN 31* --  CREATININE 1.17 1.18  GLUCOSE 122* --  CALCIUM 8.5 --    Basename 09/13/12 0847  LABPT --  INR 1.25    RUE: light touch sensation and capillary refill intact all digits.  +epl/fpl/io.  ttp dorsum of wrist.  small amount of range of motion but painful.  pain at dorsum of wrist with motion of digits.  some dorsal swelling and mild erythema.  no volar pain.  no proximal streaks or erythema.  compartments soft.  no pain in hand or fingers.  Assessment/Plan: Right wrist/extensor compartment infection.  Recommend OR for I&D of right wrist.  Risks, benefits, and alternatives of surgery were discussed with patient and sister and they agree with the plan of care.    Jacai Kipp R 09/14/2012, 10:18 AM

## 2012-09-14 NOTE — Op Note (Signed)
Dictation [phone]

## 2012-09-14 NOTE — Preoperative (Signed)
Beta Blockers   Reason not to administer Beta Blockers:Not Applicable 

## 2012-09-14 NOTE — Evaluation (Signed)
Speech Language Pathology Patient Details Name: Ryan [name] MRN: [id number] DOB: Apr 17, 1946 Today's Date: 09/14/2012 Time:  -    Received orders from Dr. Loraine Maple to cancel order for swallow assessment.  Orbie Pyo [place].[employer] [phone]  09/14/2012

## 2012-09-14 NOTE — Transfer of Care (Signed)
Immediate Anesthesia Transfer of Care Note  Patient: Ryan [name]  Procedure(s) Performed: Procedure(s) (LRB) with comments: IRRIGATION AND DEBRIDEMENT EXTREMITY (Right) REMOVAL K-WIRE/PIN EXTREMITY (Right) - Right Foot  Patient Location: PACU  Anesthesia Type:General  Level of Consciousness: sedated  Airway & Oxygen Therapy: Patient Spontanous Breathing and Patient connected to face mask oxygen  Post-op Assessment: Report given to PACU RN, Post -op Vital signs reviewed and stable and Patient moving all extremities  Post vital signs: Reviewed and stable  Complications: No apparent anesthesia complications

## 2012-09-14 NOTE — Progress Notes (Signed)
ANTICOAGULATION CONSULT NOTE - Follow Up Consult  Pharmacy Consult for heparin Indication: PAD/?VTE   Labs:  Basename 09/14/12 1827 09/14/12 0435 09/13/12 2242 09/13/12 1234 09/13/12 0856 09/13/12 0847  HGB -- 9.4* -- -- -- 9.7*  HCT -- 27.2* -- -- -- 28.5*  PLT -- 184 -- -- -- 158  APTT -- -- -- -- -- 40*  LABPROT -- -- -- -- -- 15.5*  INR -- -- -- -- -- 1.25  HEPARINUNFRC 0.27* 0.15* 0.11* -- -- --  CREATININE 1.04 -- -- -- 1.17 1.18  CKTOTAL -- -- -- 2336* -- --  CKMB -- -- -- -- -- --  TROPONINI -- -- -- -- -- --    Assessment: Chronic vascular dz on RLE. 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 Taken to OR for I&D of infected R wrist today and R foot hardware removal. Heparin continued throughout procedure as reported by RN. Heparin level 0.27 almost to goal.  Goal of Therapy:  Heparin level 0.3-0.7 units/ml   Plan:  Increase IV heparin slightly to 1950 units/hr. Check next heparin level in am.  Wayland Salinas PharmD BCPS 09/14/2012,7:21 PM

## 2012-09-14 NOTE — Progress Notes (Signed)
PGY-1 Daily Progress Note Family Medicine Teaching Service [place] R. Mollyann Halbert, DO Service Pager: [phone]   Subjective: Pt is having some SOB.  Otherwise no CP, but is having some pain in the right wrist.    Objective:  VITALS Temp:  [98.8 F (37.1 C)-101.3 F (38.5 C)] 99.7 F (37.6 C) (12/26 0738) Pulse Rate:  [95-120] 100  (12/26 0400) Resp:  [16-24] 16  (12/26 0400) BP: (104-127)/(59-64) 120/64 mmHg (12/26 0400) SpO2:  [91 %-98 %] 98 % (12/26 0400)  In/Out  Intake/Output Summary (Last 24 hours) at 09/14/12 0926 Last data filed at 09/14/12 [id number]  Gross per 24 hour  Intake   1808 ml  Output   2125 ml  Net   -317 ml   UOP : 1.1 mg/kg/hr   Physical Exam: General: NAD, pleasant HEENT:MMM, EOMI B/L, [place]/AT Heart: Tachycardic, RRR, no murmur  Lungs: CTA B/L  Abdomen: Soft NT/ND Extremities: 1+ pulses in LE, 2+ in UE, trace edema in LE, + ulcerated pus draining wound R 1st MTP, probable bone  Musculoskeletal: R wrist and hand swollen w/ erythema but no induration, pain with movement or palpation Skin: no rashes, erythema as noted above  Neurology: No focal neurologic deficits    MEDS Scheduled Meds:    . atorvastatin  40 mg Oral q1800  . folic acid  1 mg Oral Daily  . multivitamin with minerals  1 tablet Oral Daily  . piperacillin-tazobactam (ZOSYN)  IV  3.375 g Intravenous Q8H  . sodium chloride  3 mL Intravenous Q12H  . thiamine  100 mg Oral Daily   Or  . thiamine  100 mg Intravenous Daily  . vancomycin  750 mg Intravenous Q12H   Continuous Infusions:    . dextrose 5 % and 0.45% NaCl 125 mL/hr at 09/14/12 0700  . heparin 1,800 Units/hr (09/14/12 0700)   PRN Meds:.acetaminophen, acetaminophen, HYDROcodone-acetaminophen, LORazepam, LORazepam, metoprolol, ondansetron (ZOFRAN) IV, ondansetron, polyethylene glycol  Labs and imaging:   CBC  Lab 09/14/12 0435 09/13/12 0847  WBC 12.8* 12.0*  HGB 9.4* 9.7*  HCT 27.2* 28.5*  PLT 184 158   BMET/CMET  Lab  09/13/12 0856 09/13/12 0847  NA 138 --  K 4.4 --  CL 106 --  CO2 21 --  BUN 31* --  CREATININE 1.17 1.18  CALCIUM 8.5 --  PROT 6.3 --  BILITOT 1.3* --  ALKPHOS 110 --  ALT 57* --  AST 85* --  GLUCOSE 122* --   Results for orders placed during the hospital encounter of 09/12/12 (from the past 24 hour(s))  COMPREHENSIVE METABOLIC PANEL     Status: Abnormal   Collection Time   09/13/12  8:56 AM      Component Value Range   Sodium 138  135 - 145 mEq/L   Potassium 4.4  3.5 - 5.1 mEq/L   Chloride 106  96 - 112 mEq/L   CO2 21  19 - 32 mEq/L   Glucose, Bld 122 (*) 70 - 99 mg/dL   BUN 31 (*) 6 - 23 mg/dL   Creatinine, Ser 1.17  0.50 - 1.35 mg/dL   Calcium 8.5  8.4 - 10.5 mg/dL   Total Protein 6.3  6.0 - 8.3 g/dL   Albumin 1.9 (*) 3.5 - 5.2 g/dL   AST 85 (*) 0 - 37 U/L   ALT 57 (*) 0 - 53 U/L   Alkaline Phosphatase 110  39 - 117 U/L   Total Bilirubin 1.3 (*) 0.3 - 1.2 mg/dL  GFR calc non Af Amer 62 (*) >90 mL/min   GFR calc Af Amer 72 (*) >90 mL/min  LACTIC ACID, PLASMA     Status: Normal   Collection Time   09/13/12  8:56 AM      Component Value Range   Lactic Acid, Venous 1.0  0.5 - 2.2 mmol/L  WOUND CULTURE     Status: Normal (Preliminary result)   Collection Time   09/13/12  9:12 AM      Component Value Range   Specimen Description WOUND RIGHT FOOT     Special Requests Normal     Gram Stain       Value: NO WBC SEEN     FEW SQUAMOUS EPITHELIAL CELLS PRESENT     RARE GRAM POSITIVE COCCI IN PAIRS   Culture Culture reincubated for better growth     Report Status PENDING    GLUCOSE, CAPILLARY     Status: Abnormal   Collection Time   09/13/12 11:46 AM      Component Value Range   Glucose-Capillary 112 (*) 70 - 99 mg/dL   Comment 1 Notify RN    HEPARIN LEVEL (UNFRACTIONATED)     Status: Abnormal   Collection Time   09/13/12 12:34 PM      Component Value Range   Heparin Unfractionated <0.10 (*) 0.30 - 0.70 IU/mL  SEDIMENTATION RATE     Status: Abnormal   Collection  Time   09/13/12 12:34 PM      Component Value Range   Sed Rate 77 (*) 0 - 16 mm/hr  URIC ACID     Status: Normal   Collection Time   09/13/12 12:34 PM      Component Value Range   Uric Acid, Serum 6.7  4.0 - 7.8 mg/dL  CK     Status: Abnormal   Collection Time   09/13/12 12:34 PM      Component Value Range   Total CK 2336 (*) 7 - 232 U/L  GLUCOSE, CAPILLARY     Status: Abnormal   Collection Time   09/13/12 10:03 PM      Component Value Range   Glucose-Capillary 144 (*) 70 - 99 mg/dL   Comment 1 Notify RN     Comment 2 Documented in Chart    HEPARIN LEVEL (UNFRACTIONATED)     Status: Abnormal   Collection Time   09/13/12 10:42 PM      Component Value Range   Heparin Unfractionated 0.11 (*) 0.30 - 0.70 IU/mL  SEDIMENTATION RATE     Status: Abnormal   Collection Time   09/14/12  4:25 AM      Component Value Range   Sed Rate 81 (*) 0 - 16 mm/hr  GLUCOSE, CAPILLARY     Status: Abnormal   Collection Time   09/14/12  4:27 AM      Component Value Range   Glucose-Capillary 102 (*) 70 - 99 mg/dL   Comment 1 Notify RN     Comment 2 Documented in Chart    HEPARIN LEVEL (UNFRACTIONATED)     Status: Abnormal   Collection Time   09/14/12  4:35 AM      Component Value Range   Heparin Unfractionated 0.15 (*) 0.30 - 0.70 IU/mL  CBC     Status: Abnormal   Collection Time   09/14/12  4:35 AM      Component Value Range   WBC 12.8 (*) 4.0 - 10.5 K/uL   RBC 4.21 (*) 4.22 -  5.81 MIL/uL   Hemoglobin 9.4 (*) 13.0 - 17.0 g/dL   HCT 27.2 (*) 39.0 - 52.0 %   MCV 64.6 (*) 78.0 - 100.0 fL   MCH 22.3 (*) 26.0 - 34.0 pg   MCHC 34.6  30.0 - 36.0 g/dL   RDW 14.8  11.5 - 15.5 %   Platelets 184  150 - 400 K/uL  GLUCOSE, CAPILLARY     Status: Abnormal   Collection Time   09/14/12  7:48 AM      Component Value Range   Glucose-Capillary 135 (*) 70 - 99 mg/dL   Comment 1 Documented in Chart     Comment 2 Notify RN     Dg Chest 2 View  09/12/2012  *RADIOLOGY REPORT*  Clinical Data: Difficulty  moving right arm and right leg for 4 days.  Circulatory problem.  CHEST - 2 VIEW  Comparison: 09/25/2008.  Findings: Cardiomegaly.  Low volume chest.  Basilar atelectasis. Gaseous distention of the colon is incidentally noted. There is no airspace consolidation.  IMPRESSION: Low volume chest with basilar atelectasis.  Cardiomegaly.   Original Report Authenticated By: Dereck Ligas, M.D.    Dg Wrist Complete Right  09/13/2012  *RADIOLOGY REPORT*  Clinical Data: Wrist pain and swelling, altered mental status  RIGHT WRIST - COMPLETE 3+ VIEW  Comparison: Right wrist films of 09/12/2012  Findings: No acute fracture is seen.  The radiocarpal joint space is minimally narrowed.  The carpal bones are in normal position. The lucency involving the tip of the ulnar styloid is again noted and is of questionable significance.  Some deformity of the distal right fifth metacarpal neck probably is due to prior fracture.  IMPRESSION: No acute fracture.   Original Report Authenticated By: Ivar Drape, M.D.    Dg Foot Complete Right  09/14/2012  *RADIOLOGY REPORT*  Clinical Data: Pain  RIGHT FOOT COMPLETE - 3+ VIEW  Comparison: Yesterday  Findings: A BB has been utilized to mark the scan location of pain. There is corresponds to the area of skin just dorsal to the the wire is transfixed in the distal first metatarsal.  Stable hardware compared with yesterday.  No new fracture.  Degenerative changes at the first metatarsophalangeal joint.  Central erosions involving the first metatarsal head are stable.  No aggressive periosteal reaction.  IMPRESSION: A skin BB marks the location of pain overlying the hardware in the distal first metatarsal.  Stable degenerative and postoperative changes.   Original Report Authenticated By: Marybelle Killings, M.D.    Dg Foot Complete Right  09/13/2012  *RADIOLOGY REPORT*  Clinical Data: Ulcer  RIGHT FOOT COMPLETE - 3+ VIEW  Comparison: None.  Findings: Three views of the right foot submitted.   Study is limited by diffuse osteopenia.  No acute fracture or subluxation. Postsurgical metallic fixation nails are noted distal aspect first metatarsal.  No periosteal reaction or bony erosion.  Small plantar spur of the calcaneus.  IMPRESSION: No acute fracture or subluxation.  No periosteal reaction or bony erosion.  Diffuse osteopenia.  Postsurgical changes distal aspect first metatarsal.   Original Report Authenticated By: Lahoma Crocker, M.D.         Assessment/Plan  Ryan [name] is a 66 y.o. year old male presenting with likely AMS secondary to Pyelo and possible urosepsis, and possible septic joint vs cellulitis   1. Gram + Cocci UTI,  Sepsis - Pt found to be tachycardic, tachypnic, and febrile last PM along with UA for many baceteria and gram stain +  for gram + cocci in clusters   1) BCx Gram + Cocci in Pairs,  UCx pending.  Right wrist film does not show acute osseous changes.  Uric Acid 6.7.  Right wrist aspirate negative for bacteria or crystals   2) Pt has open ulcer R 1st MTP could be seeding his UTI.  R foot film does not show acute osseous changes.   Ortho evaluated pt and saw pt and believe this is not the cause of his Gram + bacteremia.  However, if it is over the hardware, they may take pt to the OR. Wound Cx pending.  Appreciate Ortho Recs, will continue to follow  3) Continue Vanc Zosyn combination  4) Continue to monitor CBC daily and ESR.  CBC showing WBC steady around 12.8 and Hgb around 9.4.  ESR elevated to 77 on 12/25 and 81 on 12/26.   2. Acute Delirium: Likely secondary to infection. Has resolved now.  AAO x 4  1) UDS + for Opiates.  Did receive in ED.  ABG normal.    2) Ammonia 15 lactic acid normal acetaminophen negative, ASA negative, EtOH negative   3) O2 to keep sats >90%   3. PAD/Ischemic limb: concern on admission that LE were ischemic. Workup performed by vascular in ED which was positive for PAD on ABI testing. No reported claudication by pt.   1)continue  Heparin, will allow vascular to decide when to DC   2) Will be going for arteriogram when infection clears or can be done in outpatient.  Appreciate Vascular recommendations   4.  Acute Kidney Injury. Dehydration vs infection induced vs CKD. CR 1.4 on admission. Unsure of baseline. Producing urine, continue to monitor   1) BMET pending this AM   5. MSK: R Wrist/hand pain and swelling. Ortho consulted adn feels that pt does not have a septic joint.   1) ortho following as above   2) Plain film of R wrist negative for fx  3) CK elevated to 2336.  Will need further evaluation   6. Elevated Transaminases - Pt AST:ALT 2:1 consistent with alcoholic picture. EtOH on admission < 11  1) on CIWA  7. FEN/GI: PO status unknown.   1) Passed swallow study at bedside.  Carb modified regular  2) 1/2 NS @ 125 cc/hr.  Will d/c today if tolerating oral well   3) Strict I/O   8. Prophylaxis:   1) Hep drip for possible ischemia from PVD vs clot   9. Disposition: pending further workup   Linnaea Ahn R. Awanda Mink, DO of [facility] 09/14/2012, 8:50 AM

## 2012-09-14 NOTE — Progress Notes (Addendum)
  VASCULAR AND VEIN SURGERY PROGRESS NOTE  Progress note   HPI: Ryan [name] is a 66 y.o. male with right foot pain, right great toe wound and right wrist pain and swelling.  He does have chronic vascular disease on the right LE.  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     Significant Diagnostic Studies: CBC    Component Value Date/Time   WBC 12.8* 09/14/2012 0435   RBC 4.21* 09/14/2012 0435   HGB 9.4* 09/14/2012 0435   HCT 27.2* 09/14/2012 0435   PLT 184 09/14/2012 0435   MCV 64.6* 09/14/2012 0435   MCH 22.3* 09/14/2012 0435   MCHC 34.6 09/14/2012 0435   RDW 14.8 09/14/2012 0435    BMET    Component Value Date/Time   NA 138 09/13/2012 0856   K 4.4 09/13/2012 0856   CL 106 09/13/2012 0856   CO2 21 09/13/2012 0856   GLUCOSE 122* 09/13/2012 0856   BUN 31* 09/13/2012 0856   CREATININE 1.17 09/13/2012 0856   CALCIUM 8.5 09/13/2012 0856   GFRNONAA 62* 09/13/2012 0856   GFRAA 72* 09/13/2012 0856    COAG Lab Results  Component Value Date   INR 1.25 09/13/2012   No results found for this basename: PTT     I/O last 3 completed shifts: In: 4173.6 [P.O.:480; I.V.:1493.6; IV Piggyback:2200] Out: 2450 [Urine:2450]  Physical Examination General A and O x 3 Pulmonary: Respirations are non-labored Doppler right DP/PT, Left doppler DP Skin of both feet warm to touch Right great toe wound dressing clean and dry   Assessment/Plan Ryan [name] is a 66 y.o. year old male that will need to be further evaluated with angiography of the right LE,  however with his infectious issues this has been canceled. We will continue to follow him and monitor his wound  If he is able to heal the wound, obviously vascular reconstruction would not be required.   Theda Sers, EMMA MAUREEN PA-C     Laurence Slate [facility] 09/14/2012 7:47 AM  Will delay angiogram until infectious issues resolved.  This could be arranged as an outpatient

## 2012-09-14 NOTE — OR Nursing (Signed)
Procedure 1 Dr. Fredna Dow: Right Wrist I&D Start Time: [id number] End Time: [id number]  Procedure 2 Dr. Berenice Primas: Right Foot Hardware Removal Start Time: [id number] End Time: 1402

## 2012-09-14 NOTE — Progress Notes (Signed)
Orthopedic Tech Progress Note Patient Details:  Ryan [name] 10/26/1945 [id number]  Ortho Devices Type of Ortho Device: Other (comment) (kuzma sling) Ortho Device/Splint Location: (R) UE Ortho Device/Splint Interventions: Application   Braulio Bosch 09/14/2012, 5:22 PM

## 2012-09-15 ENCOUNTER — Inpatient Hospital Stay (HOSPITAL_COMMUNITY): Payer: Medicare PPO

## 2012-09-15 DIAGNOSIS — G9341 Metabolic encephalopathy: Secondary | ICD-10-CM

## 2012-09-15 DIAGNOSIS — I739 Peripheral vascular disease, unspecified: Secondary | ICD-10-CM | POA: Diagnosis present

## 2012-09-15 DIAGNOSIS — I639 Cerebral infarction, unspecified: Secondary | ICD-10-CM | POA: Diagnosis not present

## 2012-09-15 DIAGNOSIS — R6520 Severe sepsis without septic shock: Secondary | ICD-10-CM | POA: Diagnosis present

## 2012-09-15 DIAGNOSIS — I319 Disease of pericardium, unspecified: Secondary | ICD-10-CM

## 2012-09-15 DIAGNOSIS — N390 Urinary tract infection, site not specified: Secondary | ICD-10-CM | POA: Diagnosis present

## 2012-09-15 DIAGNOSIS — A419 Sepsis, unspecified organism: Secondary | ICD-10-CM | POA: Diagnosis present

## 2012-09-15 DIAGNOSIS — I6350 Cerebral infarction due to unspecified occlusion or stenosis of unspecified cerebral artery: Secondary | ICD-10-CM

## 2012-09-15 DIAGNOSIS — M009 Pyogenic arthritis, unspecified: Secondary | ICD-10-CM | POA: Diagnosis present

## 2012-09-15 LAB — CBC
HCT: 27.3 % — ABNORMAL LOW (ref 39.0–52.0)
Hemoglobin: 9.5 g/dL — ABNORMAL LOW (ref 13.0–17.0)
MCH: 22.4 pg — ABNORMAL LOW (ref 26.0–34.0)
MCHC: 34.8 g/dL (ref 30.0–36.0)
MCV: 64.4 fL — ABNORMAL LOW (ref 78.0–100.0)
Platelets: 202 10*3/uL (ref 150–400)
RBC: 4.24 MIL/uL (ref 4.22–5.81)
RDW: 14.7 % (ref 11.5–15.5)
WBC: 14.4 10*3/uL — ABNORMAL HIGH (ref 4.0–10.5)

## 2012-09-15 LAB — BASIC METABOLIC PANEL
BUN: 19 mg/dL (ref 6–23)
CO2: 18 mEq/L — ABNORMAL LOW (ref 19–32)
Calcium: 8.1 mg/dL — ABNORMAL LOW (ref 8.4–10.5)
Chloride: 100 mEq/L (ref 96–112)
Creatinine, Ser: 0.98 mg/dL (ref 0.50–1.35)
GFR calc Af Amer: >90 mL/min (ref >90)
GFR calc non Af Amer: 83 mL/min — ABNORMAL LOW (ref >90)
Glucose, Bld: 116 mg/dL — ABNORMAL HIGH (ref 70–99)
Potassium: 3.7 mEq/L (ref 3.5–5.1)
Sodium: 132 mEq/L — ABNORMAL LOW (ref 135–145)

## 2012-09-15 LAB — CULTURE, BLOOD (ROUTINE X 2)

## 2012-09-15 LAB — HEPARIN LEVEL (UNFRACTIONATED): Heparin Unfractionated: 0.27 IU/mL — ABNORMAL LOW (ref 0.30–0.70)

## 2012-09-15 LAB — SEDIMENTATION RATE: Sed Rate: 75 mm/hr — ABNORMAL HIGH (ref 0–16)

## 2012-09-15 MED ORDER — ASPIRIN 81 MG PO CHEW
81.0000 mg | CHEWABLE_TABLET | Freq: Every day | ORAL | Status: DC
  Administered 2012-09-15 – 2012-09-25 (×9): 81 mg via ORAL
  Filled 2012-09-15: qty 4
  Filled 2012-09-15 (×8): qty 1

## 2012-09-15 MED ORDER — CEFAZOLIN SODIUM-DEXTROSE 2-3 GM-% IV SOLR
2.0000 g | Freq: Three times a day (TID) | INTRAVENOUS | Status: DC
  Administered 2012-09-15 – 2012-10-06 (×60): 2 g via INTRAVENOUS
  Filled 2012-09-15 (×71): qty 50

## 2012-09-15 MED ORDER — WHITE PETROLATUM GEL
Status: AC
  Administered 2012-09-15: 12:00:00
  Filled 2012-09-15: qty 5

## 2012-09-15 MED ORDER — MORPHINE SULFATE 4 MG/ML IJ SOLN
INTRAMUSCULAR | Status: AC
  Administered 2012-09-15: 2 mg via INTRAVENOUS
  Filled 2012-09-15: qty 1

## 2012-09-15 MED ORDER — HEPARIN SODIUM (PORCINE) 5000 UNIT/ML IJ SOLN
5000.0000 [IU] | Freq: Three times a day (TID) | INTRAMUSCULAR | Status: DC
  Filled 2012-09-15 (×3): qty 1

## 2012-09-15 MED FILL — Heparin Sodium (Porcine) 100 Unt/ML in Sodium Chloride 0.45%: INTRAMUSCULAR | Qty: 250 | Status: AC

## 2012-09-15 NOTE — Progress Notes (Signed)
Family Medicine Teaching Service Attending Note  I interviewed and examined patient Signs and reviewed their tests and x-rays.  I discussed with Dr. Awanda Mink and reviewed their note for today.  I agree with their assessment and plan.     Additionally  Alert oriented Still feels feverish Appreciate ID and Ortho recommendations Consult neurosurgery for MRI findings

## 2012-09-15 NOTE — Progress Notes (Signed)
VASCULAR LAB PRELIMINARY  PRELIMINARY  PRELIMINARY  PRELIMINARY  Carotid duplex  completed.    Preliminary report:  Bilateral:  No evidence of hemodynamically significant internal carotid artery stenosis.   Vertebral artery flow is antegrade.      Bernese Doffing, RVT 09/15/2012, 12:41 PM

## 2012-09-15 NOTE — Progress Notes (Signed)
[facility]  Subjective  - POD #1   Orthopedic Surgery  1. Irrigation debridement of right radiocarpal and midcarpal  joints as well as extensor compartments  Removal of hardware, right great toe.  2. Excision of chronic nonhealing wound  Objective 105/57 105 100.1 F (37.8 C) (Oral) 24 95%  Intake/Output Summary (Last 24 hours) at 09/15/12 0801 Last data filed at 09/15/12 0400  Gross per 24 hour  Intake    340 ml  Output    930 ml  Net   -590 ml    Physical Examination  General A and O x 3  Pulmonary: Respirations are non-labored  Skin of both feet warm to touch  Right great toe wound dressing clean and dry Left arm elevated  With dressing C/D/I   Assessment/Planning: POD #1 Ortho surgery Chronic vascular disease on the right LE. 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 Will delay angiogram until infectious issues resolved. This could be arranged as an outpatient    Laurence Slate [facility] 09/15/2012 8:01 AM   Laboratory Lab Results:  Basename 09/14/12 0435 09/13/12 0847  WBC 12.8* 12.0*  HGB 9.4* 9.7*  HCT 27.2* 28.5*  PLT 184 158   BMET  Basename 09/14/12 1827 09/13/12 0856  NA 134* 138  K 4.4 4.4  CL 102 106  CO2 20 21  GLUCOSE 96 122*  BUN 20 31*  CREATININE 1.04 1.17  CALCIUM 8.3* 8.5    COAG Lab Results  Component Value Date   INR 1.25 09/13/2012   No results found for this basename: PTT    Antibiotics Anti-infectives     Start     Dose/Rate Route Frequency Ordered Stop   09/13/12 1200   vancomycin (VANCOCIN) 750 mg in sodium chloride 0.9 % 150 mL IVPB        750 mg 150 mL/hr over 60 Minutes Intravenous Every 12 hours 09/13/12 0409     09/13/12 0800  piperacillin-tazobactam (ZOSYN) IVPB 3.375 g       3.375 g 12.5 mL/hr over 240 Minutes Intravenous Every 8 hours 09/13/12 0409     09/12/12 2300   vancomycin (VANCOCIN) IVPB 1000 mg/200 mL premix        1,000 mg 200 mL/hr over 60 Minutes  Intravenous  Once 09/12/12 2253 09/13/12 0128   09/12/12 2300  piperacillin-tazobactam (ZOSYN) IVPB 3.375 g       3.375 g 12.5 mL/hr over 240 Minutes Intravenous  Once 09/12/12 2253 09/13/12 0023

## 2012-09-15 NOTE — Op Note (Signed)
NAME:  Ryan [name], Ryan [name] NO.:  [id number]  MEDICAL RECORD NO.:  [id number]  LOCATION:  [facility]                         FACILITY:  [facility]  PHYSICIAN:  Leanora Cover, MD        DATE OF BIRTH:  1946-01-21  DATE OF PROCEDURE:  09/14/2012 DATE OF DISCHARGE:                              OPERATIVE REPORT   PREOPERATIVE DIAGNOSIS:  Right septic wrist.  POSTOPERATIVE DIAGNOSIS:  Right septic wrist.  PROCEDURE:  Irrigation debridement of right radiocarpal and midcarpal joints as well as extensor compartments.  SURGEON:  Leanora Cover, MD.  ASSISTANT:  None.  ANESTHESIA:  General.  IV FLUIDS:  Per anesthesia flow sheet.  ESTIMATED BLOOD LOSS:  Minimal.  COMPLICATIONS:  None.  SPECIMENS:  Cultures from wrist to micro.  TOURNIQUET TIME:  56 minutes.  DISPOSITION:  Stable to PACU.  INDICATIONS:  This patient is a 66 year old male who was been having right wrist pain for the past several days.  He was transferred here from outside hospital.  He has had a white count slightly elevated at 12 to 12.8.  He was afebrile this morning, but febrile yesterday.  He has had continued right wrist pain.  An aspiration of the wrist was performed by Dr. Burney Gauze revealing some bacteria on Gram stain.  On examination this morning, he had an erythematous painful on warm wrist. I discussed with this patient, his sister and wife, the nature of the condition.  Recommended irrigation and debridement of the right wrist in the operating room.  Risks, benefits, alternatives of the surgery were discussed including risk of blood loss, infection, damage to nerves, vessels, tendons, ligaments, bone; failure of surgery; need for additional surgery, complications with wound healing, continued pain, continued infection, need for repeat irrigation and debridement.  They voiced understanding and elected to proceed.  OPERATIVE COURSE:  After being identified preoperatively by myself, the patient and I  agreed upon procedure and site procedure.  Surgical site was marked.  The risks, benefits, and alternatives of surgery were reviewed and he wished to proceed.  Surgical consent had been signed. The extremity was marked.  He was transferred to the operating room and placed on the operating table in supine position with the right upper extremity on an armboard.  General anesthesia was induced by anesthesiologist.  The right upper extremity was prepped and draped in normal sterile orthopedic fashion.  A surgical pause was performed between surgeons, anesthesia, operating staff, and all were in agreement as to the patient, procedure, and site of procedure.  Tourniquet at the proximal aspect of the extremity was inflated after exsanguination the forearm with an Esmarch bandage and gravity exsanguination of the hand and wrist.  Incision was made centralized over the wrist in a longitudinal fashion.  Skin and subcutaneous tissues by spreading technique.  The space between the third and fourth dorsal compartments was entered.  The retinaculum was split of the fourth dorsal compartment slightly distally.  The midcarpal joint was entered first.  However, there was some turbid brownish colored fluid, but no gross purulence. Cultures were taken.  It was sent to micro for aerobes and anaerobes. The second, third, fourth, fifth, and sixth extensor  compartments were entered from the distal aspect.  There was no gross purulence within any of the tendon compartments, so there was some fluid within them.  The second compartment had some synovitis and loose soft tissue which was debrided.  The radiocarpal joint was entered.  Again, there was no significant purulence.  The scapholunate ligament was then identified and was incompetent.  The articular cartilage looked without damage. The wounds were copiously irrigated with sterile saline.  The radiocarpal and midcarpal joints were irrigated with a vessel  cannula and cysto tubing.  A 3000-mL of sterile saline was irrigated through the wound.  A quarter-inch iodoform packing was placed as a wick in both the radiocarpal and midcarpal joints and the wound was packed open.  Two simple 4-0 nylon sutures were placed to prevent skin edge for traction while not approximating the wounds.  A single 5-0 Monocryl suture was placed in the dorsal intercarpal ligament to reapproximated.  The wound was injected with 10 mL of 0.25% plain Marcaine to aid in postoperative analgesia it was then dressed with sterile Xeroform, 4x4s, ABD, and wrapped with Kerlix.  A volar splint was placed and wrapped with an Ace bandage.  Tourniquet was deflated at 56 minutes.  The fingertips were pink with brisk capillary refill after deflation of tourniquet. Operative drapes were broken down, the patient was awakened from anesthesia safely.  He was taken to PACU in stable condition.  He will be maintained on the medicine service.  He will continue on IV antibiotics pending culture results.  We will start hydrotherapy in 2-3 days.     Leanora Cover, MD     KK/MEDQ  D:  09/14/2012  T:  09/15/2012  Job:  [id number]

## 2012-09-15 NOTE — Evaluation (Signed)
Occupational Therapy Evaluation Patient Details Name: Ryan [name] MRN: [id number] DOB: [birth year] Today's Date: 09/15/2012 Time: [id number] OT Time Calculation (min): 45 min  OT Assessment / Plan / Recommendation Clinical Impression  Pt is a 66 yr old male admitted with multiple new CVAs, cervical stenosis,  infection of right wrist and right foot.  Underwent debridement of the right wrist and removal of hardware in the right foot.  Currently presents with significant defictis in the areas of selfcare, mobility, and LUE hand function.  Will benefit from acute OT services to help address these deficits in order to increase independence.  Feel pt will need extensive rehab at CIR level and then initial  24 hour assistance upon discharge.    OT Assessment  Patient needs continued OT Services    Follow Up Recommendations  CIR    Barriers to Discharge Decreased caregiver support Wife works during the day.  Pt reports that his brother might be able to come and stay with him.  Equipment Recommendations  3 in 1 bedside comode;Tub/shower bench    Recommendations for Other Services Rehab consult  Frequency  Min 2X/week    Precautions / Restrictions Precautions Precautions: Fall Restrictions Weight Bearing Restrictions: Yes RUE Weight Bearing: Non weight bearing RLE Weight Bearing: Weight bearing as tolerated   Pertinent Vitals/Pain HR increased to 130 BPM with transfer to bedside chair, O2 sats 92-96 % on room air, HR decreased to 115 in sitting at end of session    ADL  Eating/Feeding: Simulated;Supervision/safety Where Assessed - Eating/Feeding: Chair Grooming: Simulated;Minimal assistance Where Assessed - Grooming: Supported sitting Upper Body Bathing: Simulated;Moderate assistance Where Assessed - Upper Body Bathing: Supported sitting Lower Body Bathing: Simulated;+2 Total assistance Lower Body Bathing: Patient Percentage: 30% Where Assessed - Lower Body Bathing: Supported sit  to stand Upper Body Dressing: Simulated;Moderate assistance Where Assessed - Upper Body Dressing: Unsupported sitting Lower Body Dressing: Simulated;+2 Total assistance Lower Body Dressing: Patient Percentage: 30% Where Assessed - Lower Body Dressing: Supported sit to stand Toilet Transfer: Simulated;+2 Total assistance Toilet Transfer: Patient Percentage: 30% Armed forces technical officer Method: Arts development officer: Other (comment) (to bedside chair) Toileting - Clothing Manipulation and Hygiene: Performed;+2 Total assistance Toileting - Clothing Manipulation and Hygiene: Patient Percentage: 30% Where Assessed - Toileting Clothing Manipulation and Hygiene: Sit to stand from 3-in-1 or toilet Transfers/Ambulation Related to ADLs: Pt required total assist +2 (pt 30%) for sit to stand and stand pivot to bedside chair. ADL Comments: Pt required total assist for supine to sit EOB.  Once sitting pt was able to maintain with close supervision.  Demonstrates decreased forward trunk flexion for sit to stand or LB selfcare.  Required total +2 assist for sit to stand. He needs max assist to advance the LLE during the functional transfer as well.      OT Diagnosis: Generalized weakness;Cognitive deficits;Acute pain  OT Problem List: Decreased strength;Decreased coordination;Decreased range of motion;Decreased safety awareness;Decreased activity tolerance;Impaired balance (sitting and/or standing);Decreased knowledge of use of DME or AE;Pain;Impaired UE functional use;Impaired sensation OT Treatment Interventions: Self-care/ADL training;Therapeutic activities;Therapeutic exercise;Neuromuscular education;DME and/or AE instruction;Balance training;Patient/family education   OT Goals Acute Rehab OT Goals OT Goal Formulation: With patient Time For Goal Achievement: 09/29/12 Potential to Achieve Goals: Good ADL Goals Pt Will Perform Upper Body Bathing: with min assist;Unsupported;Sitting, edge of  bed ADL Goal: Upper Body Bathing - Progress: Goal set today Pt Will Perform Lower Body Bathing: with mod assist;Sit to stand from bed;with adaptive equipment ADL Goal: Lower Body Bathing -  Progress: Goal set today Pt Will Perform Upper Body Dressing: with supervision;Unsupported;Sitting, bed ADL Goal: Upper Body Dressing - Progress: Goal set today Pt Will Perform Lower Body Dressing: with mod assist;Unsupported;Sit to stand from bed ADL Goal: Lower Body Dressing - Progress: Goal set today Pt Will Transfer to Toilet: with mod assist;Stand pivot transfer;3-in-1;with DME ADL Goal: Toilet Transfer - Progress: Goal set today Pt Will Perform Toileting - Clothing Manipulation: with mod assist;Sitting on 3-in-1 or toilet;Standing ADL Goal: Toileting - Clothing Manipulation - Progress: Goal set today Pt Will Perform Toileting - Hygiene: with mod assist;Sit to stand from 3-in-1/toilet ADL Goal: Toileting - Hygiene - Progress: Goal set today  Visit Information  Last OT Received On: 09/15/12 Assistance Needed: +1    Subjective Data  Subjective: I have my brother who might be able to come over and help. Patient Stated Goal: Did not state but agreeable to participate in OT.   Prior Functioning     Home Living Lives With: Spouse Available Help at Discharge: Available PRN/intermittently;Other (Comment) (wife works during the day) Type of Home: House Home Access: Stairs to enter Technical brewer of Steps: 2 Entrance Stairs-Rails: None Home Layout: One level Bathroom Shower/Tub: Multimedia programmer: Standard Bathroom Accessibility: Yes Home Adaptive Equipment: Environmental consultant - standard Additional Comments: walker was his mother-in-law Prior Function Level of Independence: Independent Able to Take Stairs?: Yes Driving: Yes Vocation: Part time employment Comments: worked part-time as a Surveyor, 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) Communication Communication: No  difficulties Dominant Hand: Right         Vision/Perception Vision - Risk analyst: Within Advertising copywriter Vision Assessment: Vision tested Ocular Range of Motion: Within Functional Limits Tracking/Visual Pursuits: Able to track stimulus in all quads without difficulty Perception Perception: Within Functional Limits Praxis Praxis: Intact   Cognition  Overall Cognitive Status: Impaired Area of Impairment: Memory;Awareness of deficits Arousal/Alertness: Awake/alert Behavior During Session: [facility] for tasks performed Awareness of Deficits: Pt needed max questioning cues in order to recall reasons for hospitalization and surgeries    Extremity/Trunk Assessment Right Upper Extremity Assessment RUE ROM/Strength/Tone: Deficits RUE ROM/Strength/Tone Deficits: Pt with AROM elbow flexion and extension approximately 80 % of normal.  Shoulder AROM 0-130 degrees.  Hand and wrist wrapped secondary to recent I&D.  Only minimal digit flexion noted.  Did not attempt PROM at this time. RUE Sensation: Deficits RUE Sensation Deficits: reports slight numbness in digits compared to the right RUE Coordination: Deficits Left Upper Extremity Assessment LUE ROM/Strength/Tone: Within functional levels LUE Sensation: WFL - Light Touch LUE Coordination: WFL - gross/fine motor Trunk Assessment Trunk Assessment: Normal     Mobility Bed Mobility Bed Mobility: Supine to Sit Supine to Sit: 1: +1 Total assist;HOB flat Details for Bed Mobility Assistance: Pt needed total assist for all aspects of bed mobility. Transfers Transfers: Sit to Stand Sit to Stand: 1: +2 Total assist;Without upper extremity assist;Other (comment) (LUE assist only) Sit to Stand: Patient Percentage: 30%           Balance Balance Balance Assessed: Yes Static Standing Balance Static Standing - Balance Support: Left upper extremity supported Static Standing - Level of Assistance: 3: Mod assist   End of Session OT -  End of Session Activity Tolerance: Patient limited by fatigue Patient left: in chair;with call bell/phone within reach;with nursing in room Nurse Communication: Mobility status     [place] OTR/L Pager number [phone] 09/15/2012, 4:52 PM

## 2012-09-15 NOTE — Discharge Summary (Signed)
Physician Discharge Summary  Patient ID: Ryan [name] MRN: [id number] DOB: 07/05/1946 Age: 66 y.o.  Admit date: 09/12/2012 Discharge date: 10/06/2012 Admitting Physician: Zigmund Gottron, MD  PCP: Default, Provider, MD  Consultants:Orthopaedics, Orthopaedic Hand specialist, Infectious Disease, Vascular Surgery, Neurosurgery, Cardiology, Cardiothoracic Surgery    Discharge Diagnosis:  Principal Problem:  *Bacteremia due to Staphylococcus Active Problems:  Fever  Peripheral vascular disease  UTI (urinary tract infection)  Sepsis with metabolic encephalopathy  Septic arthritis of wrist, right  Acute ischemic stroke  Acute pericarditis, unspecified  Atrial fibrillation  Acute diastolic heart failure  Acute combined systolic and diastolic heart failure  Bacterial endocarditis  Mitral valve regurgitation due to infection  Hypokalemia  Diabetes mellitus  Essential hypertension  Encounter for long-term (current) use of other medications    Hospital Course Ryan [name] is a 66 y.o. year old male with acute encephalopathy (now resolved) secondary to sepsis from multiple sources, s/p I&D of R wrist and R MTP lesion (with HW removal), Multiple small R hemisphere infarcts that are likely septic emboli, C6-T1 Diskitis, pericarditis, Endocarditis (s/p MVR replacement), and E. Coli UTI (s/p treatment).   The pt was transferred from an OSH for concerns of ischemic limb and acute encephalopathy. When he arrived here vascular surgery and ortho evaluated him for his ischemic R foot and inflamed R wrist. He was satrted on IV vanc and zosyn. He was found to have a septic R wrist and foot infection with MSSA. His wrist and foot wound were I&D'd and hardware from a previous surgery was removed from his foot wound. Vascular surgery performed ABI on the patient and noted that he had a decreased ABI on the R down to 0.49 and recommended starting him on a heparin drip for possible PAD. Infectious  disease was consulted who suggested MRI head and neck and TTE to look for seeding, and blood cultures, which later yielded MSSA also. The antibiotics were de-escalated to Ancef Q8 hours to cover MSSA. The MRI yielded results that were consistent with diskitis of C5-6 and T1 and an epidural abscess and multiple new small infarcts. Neurosurgery was consulted and decided that surgical intervention was unnecessary, and so he was continued on IV antibiotics. The TTE initially showed no vegetations. He had ST elevations on an EKG and three troponins were obtained that were elevated. Cardiology was consulted who felt that the ST elevation most likely represented pericarditis rather than a STEMI. A TTE was repeated that showed a vegetation and a TEE was later performed showing a 2.5 cm vegetation on the mitral valve. The vegetation caused regurgitant flow and caused some new onset heart failure necessitating  A valve replacement. He was taken to the OR and his MV replaced with a bovine tissue valve. He spent less than 36 hours on pressors after leaving the OR and was extubated by the next day. He was then found to have BL lower extremity DVT and was subsequently started on a heparin drip. Vascular surgery returned after the infection was treated with all but a long coarse of antibiotics and felt that LE angiogram and appropriate stenting would be necessary to treat the ischemia in his R leg. HE was taken for LE angio and found to have a completely occluded popliteal artery with intervention too complicated to currently intervene endovascularly currently. All these findings are detailed below:  PAD/Ischemic limb:  - s/p LE angio on 10/03/2012  - R popliteal artery occlusion with single runoff would be very difficult endovascualr recanalization, will  re-evaluate in 2-3 weeks and intervene with endovascular vs bypass at that time if wound is still not healing.  - concern on admission that LE was ischemic.  - R ABI 0.48 on  1/2/214, L ABI WNL   Post-op Anemia  - Hgb dropped to 7.1 on 1/11, now stable at 8.8 post transfusion 2 units PRBC on 1/11  - fecal occult blood negative   DVT  - BL DVT likely from bedbound state since admission  - Heparin drip per Dr. Cyndia Bent, transitioning to PO warfarin and lovenox bridge today, postponed previously for vasular intervention.   CV - s/p Mitral valve replacement on 09/26/2012- bovine valve - Heart cath on 09/21/2012, Findings:   - Widely patent coronary arteries, pulmonary HTN with PCWP of 32mmHg  - Post-op pain controlled well with  PRN oxycodone and tramadol  Afib with RVR due to Pericarditis   - rhythm controlled on Amiodarone 200 mg daily  - Pericarditis found after ST elevations and elevated troponins Acute Diastolic Heart failure   - imprpved s/p MVR (done 1/7)  - secondary to mitral regurgitation caused by large vegetation now replaced   - Lasix used intermittently at the end of his saty, not necessary before discharge  Pulmonary - stable on RA, intubated for surgery, extubated same day - no complaints of dyspnea currently  - CXR with Small L pleural effusion post op, chest tube placed by CTS 1/8 and removed 1/9   ID  - Continue Ancef per ID until 11/07/2012- 6 weeks post MVR  - Blood culture from 12/30 during fever with no growth  - MV culture with no growth at 3 days- final  Endocarditis  - 2.5 cm MV vegetation, now removed s/p MVR  - abx, as above  MSSA Bacteremia   - Blood Cx 12/23 shows pan sensitive MSSA, Repeat from 12/26 no growth, Repeated on 09/18/2012- no growth   - R wrist    - OR by Hand surgery on 12/26 for debridement- Cx with mod MSSA   - R MTP-    - Debridment and hardware removal on 12/26    - Q 3 days R foot dressings Ortho believe it will likely not heal unless circulation addressed.    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  - Resolved during admission  Elevated Transaminases  - Pt AST:ALT 2:1 consistent with alcoholic picture. EtOH on admission < 11  - consider recheck LFT's   Newly Dx DM II - A1C 6.5 on check  - Insulin drip 1/8 perioperatively, transitioned to subq insulin post op, now monitoring CBGs  - CBGs very well controlled without intervention.  - Consider sensitive SSI if starts to develop hyperglycemia     Procedures/Imaging:  Dg Chest 2 View  09/12/2012  IMPRESSION: Low volume chest with basilar atelectasis.  Cardiomegaly.   Original Report Authenticated By: Dereck Ligas, M.D.    Dg Wrist Complete Right  09/13/2012   IMPRESSION: No acute fracture.   Original Report Authenticated By: Ivar Drape, M.D.    Mr [facility] Wo Contrast  09/15/2012  IMPRESSION:  Several small areas of acute infarction involving the right hemisphere including portions of the; right occipital lobe, right temporal lobe, right opercular region, posterior right frontal lobe and right parietal lobe.  Tiny small acute infarcts involving the posterior left temporal lobe and left parietal - occipital junction.  Tiny acute infarcts left cerebellum and right cerebellar tonsil.   MRA HEAD  Findings: Anterior circulation without medium or large size vessel significant stenosis or occlusion.  Mild narrowing involving portions of the A1 segment of the anterior cerebral artery bilaterally and the M1 segment of the middle cerebral artery bilaterally.  Mild branch vessel irregularity middle cerebral artery and anterior cerebral artery distribution.  Codominant vertebral arteries.  No high-grade stenosis of the distal vertebral arteries.  Very mild narrowing and irregularity mid aspect of the basilar artery.  Poor delineation AICAs.  Mild irregularity and narrowing involving portions of the superior cerebellar arteries.  No aneurysmal or  vascular malformation noted.    MRI CERVICAL SPINE WITHOUT CONTRAST    IMPRESSION: C6-7 diskitis with spread of infection to the adjacent facet joints, spinous process and interspinous ligaments/posterior paraspinal musculature as discussed above.  Epidural abscess suspected although evaluation limited without contrast.  This contributes to spinal stenosis most prominent C6 level.  Cervical spondylotic changes most notable C3-4 level.  Please see above further detail.  Critical Value/emergent results were called by telephone at the time of interpretation on 09/15/2012 at 11:09 am to Dr. McDiarmid, who verbally acknowledged these results.   Original Report Authenticated By: Genia Del, M.D.    2 D Echo 09/15/12 Study Conclusions - Left ventricle: Pt. tachycardic at time of study. The cavity size was mildly dilated. Wall thickness was normal. The estimated ejection fraction was 65%. Wall motion was normal; there were no regional wall motion abnormalities. - Left atrium: The atrium was mildly dilated. - Right ventricle: The cavity size was mildly dilated. Systolic function was normal. - Pericardium, extracardiac: There is a small/moderate circumferential pericardial effusion. No evidence of tamponade. - Impressions: No definite vegetations are seen.   TTE 09/17/2012 Study Conclusions - Mitral valve: There was a large, cystic (hypoechoic interior) mass on the lateral aspect of the annulus; the abnormality is new since the previous study; the appearance is consistent with vegetation and/or abscess. Moderate to severe regurgitation, with multiple jets directed eccentrically. - Pericardium, extracardiac: A small pericardial effusion was identified posterior to the heart. The fluid had no internal echoes.There was no evidence of hemodynamic compromise.  TEE 09/18/2012  - The estimated ejection fraction was in the range of 60% to 65%. - Aortic valve: No evidence of vegetation. - Mitral  valve: Large 2.5cm diameter vegetation/  - Left atrium: No evidence of thrombus in the atrial cavity or appendage. - Right atrium: No evidence of thrombus in the atrial cavity or appendage. - Tricuspid valve: No evidence of vegetation. - Pulmonic valve: No evidence of vegetation. - Pericardium, extracardiac: A small, free-flowing pericardial effusion was identified along the right atrial free wall.   CXR 09/26/2012  IMPRESSION:  Postoperative changes with support apparatus as described.  Left pleural effusion and bibasilar atelectasis - no evidence of  pneumothorax.   CXR 09/27/2012  IMPRESSION:  1. Somewhat low lung volumes with bibasilar air space disease,  left greater than right, and probable small left pleural effusion.  2. No definite pneumothorax.  Venous duplex 09/28/2012  Summary: Findings consistent with deep vein thrombosis involving the right mid femoral vein (behind a valve leaflet), the right gastrocnemius vein and the left gastrocnemius vein.  LE angio 10/03/2012 #1 right popliteal artery occlusion with reconstitution of the peroneal artery, 12 cm beyond the occlusion. The peroneal artery is the single vessel  runoff. This would be a very complicated endovascular recanalization, particularly given the fact that he only has one vessel runoff. I made the decision to not proceed with intervention, but rather to evaluate the patient's foot wound in 2-3 weeks. At that time, if he is still dealing with non-healing issues, consideration would be made for recanalization of his occluded popliteal artery versus above-knee popliteal artery to peroneal artery bypass graft.  #2 single vessel runoff on the left via the peroneal artery   Labs  CBC  Lab 10/06/12 1000 10/06/12 0521 10/05/12 1145 10/04/12 0525  WBC -- 7.2 8.9 8.9  HGB 9.6* 8.5* 9.7* --  HCT 30.1* 26.1* 29.7* --  PLT -- 323 300 313   BMET  Lab 10/06/12 0521 10/05/12 1755 10/05/12 0516  NA 138 136 139  K 3.3* 3.0* 2.8*   CL 103 100 102  CO2 26 26 27   BUN 8 9 8   CREATININE 0.66 0.67 0.77  CALCIUM 8.2* 8.2* 8.1*  PROT -- -- --  BILITOT -- -- --  ALKPHOS -- -- --  ALT -- -- --  AST -- -- --  GLUCOSE 98 125* 97    Results for orders placed during the hospital encounter of 09/12/12  URINE CULTURE     Status: Normal   Collection Time   09/12/12  8:55 PM      Component Value Range Status Comment   Specimen Description URINE, CATHETERIZED   Final    Special Requests CX ADDED AT 2127 ON [id number]   Final    Culture  Setup Time 09/12/2012 22:38   Final    Colony Count >=100,000 COLONIES/ML   Final    Culture ESCHERICHIA COLI   Final    Report Status 09/14/2012 FINAL   Final    Organism ID, Bacteria ESCHERICHIA COLI   Final   GRAM STAIN     Status: Normal   Collection Time   09/12/12  9:36 PM      Component Value Range Status Comment   Specimen Description SYNOVIAL FLUID RIGHT WRIST   Final    Special Requests Normal   Final    Gram Stain     Final    Value: ABUNDANT WBC PRESENT, PREDOMINANTLY PMN     FEW GRAM POSITIVE COCCI IN PAIRS IN CLUSTERS     Gram Stain Report Called to,Read Back By and Verified With: RN T. MITCHELL 09/12/12 [place]   Report Status 09/12/2012 FINAL   Final   BODY FLUID CULTURE     Status: Normal   Collection Time   09/12/12  9:36 PM      Component Value Range Status Comment   Specimen Description SYNOVIAL FLUID RIGHT WRIST   Final    Special Requests Normal   Final    Gram Stain     Final    Value: ABUNDANT WBC PRESENT, PREDOMINANTLY PMN     FEW GRAM POSITIVE COCCI IN PAIRS     IN CLUSTERS Performed at [facility] Gram Stain Report Called to,Read Back By and Verified With: Gram Stain Report Called to,Read Back By and Verified With: RN T.MITCHELL 09/12/12 2[address] [facility] M   Culture     Final    Value: STAPHYLOCOCCUS AUREUS     Note: RIFAMPIN AND GENTAMICIN SHOULD NOT BE USED AS SINGLE DRUGS FOR TREATMENT OF STAPH INFECTIONS.   Report Status 09/16/2012 FINAL    Final    Organism ID, Bacteria STAPHYLOCOCCUS AUREUS   Final  CULTURE, BLOOD (ROUTINE X 2)     Status: Normal   Collection Time   09/12/12 10:45 PM      Component Value Range Status Comment   Specimen Description BLOOD ARM LEFT   Final    Special Requests BOTTLES DRAWN AEROBIC AND ANAEROBIC 10CC   Final    Culture  Setup Time 09/13/2012 04:15   Final    Culture     Final    Value: STAPHYLOCOCCUS AUREUS     Note: RIFAMPIN AND GENTAMICIN SHOULD NOT BE USED AS SINGLE DRUGS FOR TREATMENT OF STAPH INFECTIONS.     Note: Gram Stain Report Called to,Read Back By and Verified With: WHITNEY DAVIS@1518  ON [id number] BY [facility]   Report Status 09/15/2012 FINAL   Final    Organism ID, Bacteria STAPHYLOCOCCUS AUREUS   Final   CULTURE, BLOOD (ROUTINE X 2)     Status: Normal   Collection Time   09/12/12 10:45 PM      Component Value Range Status Comment   Specimen Description BLOOD ARM RIGHT   Final    Special Requests BOTTLES DRAWN AEROBIC ONLY 10CC   Final    Culture  Setup Time 09/13/2012 04:15   Final    Culture     Final    Value: STAPHYLOCOCCUS AUREUS     Note: SUSCEPTIBILITIES PERFORMED ON PREVIOUS CULTURE WITHIN THE LAST 5 DAYS.     Note: Gram Stain Report Called to,Read Back By and Verified With: WHITNEY DAVIS@1518  ON [id number] BY [facility]   Report Status 09/15/2012 FINAL   Final   MRSA PCR SCREENING     Status: Normal   Collection Time   09/13/12  3:44 AM      Component Value Range Status Comment   MRSA by PCR NEGATIVE  NEGATIVE Final   WOUND CULTURE     Status: Normal   Collection Time   09/13/12  9:12 AM      Component Value Range Status Comment   Specimen Description WOUND RIGHT FOOT   Final    Special Requests Normal   Final    Gram Stain     Final    Value: NO WBC SEEN     FEW SQUAMOUS EPITHELIAL CELLS PRESENT     RARE GRAM POSITIVE COCCI IN PAIRS   Culture     Final    Value: MODERATE STAPHYLOCOCCUS AUREUS     Note: RIFAMPIN AND GENTAMICIN SHOULD NOT BE USED AS SINGLE DRUGS FOR  TREATMENT OF STAPH INFECTIONS.   Report Status 09/16/2012 FINAL   Final    Organism ID, Bacteria STAPHYLOCOCCUS AUREUS   Final   WOUND CULTURE     Status: Normal   Collection Time   09/14/12  2:04 PM      Component Value Range Status Comment   Specimen Description WOUND WRIST RIGHT   Final    Special Requests PATIENT ON FOLLOWING ZOSYN,VANCOMYCIN   Final    Gram Stain     Final    Value: FEW WBC PRESENT, PREDOMINANTLY PMN     NO SQUAMOUS EPITHELIAL CELLS SEEN     NO ORGANISMS SEEN   Culture     Final    Value: MODERATE STAPHYLOCOCCUS AUREUS     Note: RIFAMPIN AND GENTAMICIN SHOULD NOT BE USED AS SINGLE DRUGS FOR TREATMENT OF STAPH INFECTIONS.   Report Status 09/17/2012 FINAL   Final    Organism ID, Bacteria STAPHYLOCOCCUS AUREUS   Final   ANAEROBIC CULTURE  Status: Normal   Collection Time   09/14/12  2:04 PM      Component Value Range Status Comment   Specimen Description WOUND WRIST RIGHT   Final    Special Requests PATIENT ON FOLLOWING ZOSYN,VANCOMYCIN   Final    Gram Stain     Final    Value: FEW WBC PRESENT, PREDOMINANTLY PMN     NO SQUAMOUS EPITHELIAL CELLS SEEN     NO ORGANISMS SEEN   Culture NO ANAEROBES ISOLATED   Final    Report Status 09/19/2012 FINAL   Final   CULTURE, BLOOD (ROUTINE X 2)     Status: Normal   Collection Time   09/14/12  6:30 PM      Component Value Range Status Comment   Specimen Description BLOOD LEFT FOOT   Final    Special Requests BOTTLES DRAWN AEROBIC ONLY 3CC   Final    Culture  Setup Time 09/14/2012 23:48   Final    Culture NO GROWTH 5 DAYS   Final    Report Status 09/20/2012 FINAL   Final   CULTURE, BLOOD (ROUTINE X 2)     Status: Normal   Collection Time   09/18/12  8:49 PM      Component Value Range Status Comment   Specimen Description BLOOD LEFT FOOT   Final    Special Requests BOTTLES DRAWN AEROBIC ONLY 1CC   Final    Culture  Setup Time 09/19/2012 05:09   Final    Culture NO GROWTH 5 DAYS   Final    Report Status 09/25/2012  FINAL   Final   CULTURE, BLOOD (ROUTINE X 2)     Status: Normal   Collection Time   09/18/12  9:15 PM      Component Value Range Status Comment   Specimen Description BLOOD LEFT FOOT   Final    Special Requests BOTTLES DRAWN AEROBIC ONLY [facility]   Final    Culture  Setup Time 09/19/2012 05:09   Final    Culture NO GROWTH 5 DAYS   Final    Report Status 09/25/2012 FINAL   Final   MRSA PCR SCREENING     Status: Normal   Collection Time   09/21/12  2:45 PM      Component Value Range Status Comment   MRSA by PCR NEGATIVE  NEGATIVE Final   CLOSTRIDIUM DIFFICILE BY PCR     Status: Normal   Collection Time   09/23/12  7:34 PM      Component Value Range Status Comment   C difficile by pcr NEGATIVE  NEGATIVE Final   TISSUE CULTURE     Status: Normal   Collection Time   09/26/12 10:22 AM      Component Value Range Status Comment   Specimen Description TISSUE   Final    Special Requests     Final    Value: PORTION OF MITRAL VALVE LEAFLETS PT ON ZINACEF ANCEG AND VANCO   Gram Stain     Final    Value: RARE WBC PRESENT, PREDOMINANTLY PMN     NO ORGANISMS SEEN   Culture NO GROWTH 3 DAYS   Final    Report Status 09/29/2012 FINAL   Final   CLOSTRIDIUM DIFFICILE BY PCR     Status: Normal   Collection Time   09/30/12  6:17 PM      Component Value Range Status Comment   C difficile by pcr NEGATIVE  NEGATIVE Final  Patient condition at time of discharge/disposition: stable  Disposition- SNF   Follow up issues: - 6 Wks of ABx with close f/u - Follow INR and adjust warfarin accordingly, for treating DVT - F/u Vascular Surgery, orthopedic surgery- hand and foot.  - New Onset DM - Consider starting Metformin - Transaminitis, consider discussing alcohol use with patient   Discharge follow up:   Discharge Orders    Future Appointments: Provider: Department: Dept Phone: Center:   10/16/2012 1:30 PM [name], MD [facility] [phone] [facility]   11/07/2012 1:30 PM  [name], MD [facility] [phone] [facility]     Future Orders Please Complete By Expires   Ambulatory referral to Nutrition and Diabetic Education      Comments:   New diagnosis of DM this admission.  A1c 6.5% (09/14/12).  Pt POD#2 MVR.  Still in ICU today (09/28/12).  May want to wait until 09/29/12 to contact patient about appointment.  Patient: Please call the [facility] after discharge to schedule an appointment for diabetes education if you do not hear from the center before discharge  [phone]   Amb Referral to Cardiac Rehabilitation      Comments:   Pt agrees to [place]. CRP in [place], will send referral.   Diet - low sodium heart healthy      Increase activity slowly       . Follow-up Information    Follow up with Eldridge Abrahams, MD. On 10/16/2012. (at 130)    Contact information:   [address] [place] [address] [phone]       Follow up with [name], MD. On 10/10/2012. (1115, arrive at 1045)    Contact information:   [place] [address] [phone]       Follow up with Tennis Must, MD. On 10/11/2012. [phone])    Contact information:   [address] [place] [address] [phone]       Follow up with [name], MD. On 11/07/2012. (Have a chest x-ray at 12:30 at [place] (1st floor), then see MD at 1:30)    Contact information:   [place] [address] [phone]         Discharge Instructions: Please refer to Patient Instructions section of EMR for full details.  Patient was counseled important signs and symptoms that should prompt return to medical care, changes in medications, dietary instructions, activity restrictions, and follow up appointments.  Significant instructions noted below:   Discharge Medications   Medication List     As of 10/06/2012  2:08 PM    START taking these  medications         amiodarone 200 MG tablet   Commonly known as: PACERONE   Take 1 tablet (200 mg total) by mouth daily.      amLODipine 2.5 MG tablet   Commonly known as: NORVASC   Take 1 tablet (2.5 mg total) by mouth daily.      atorvastatin 40 MG tablet   Commonly known as: LIPITOR   Take 1 tablet (40 mg total) by mouth daily at 6 PM.      bisacodyl 5 MG EC tablet   Commonly known as: DULCOLAX   Take 2 tablets (10 mg total) by mouth daily as needed (Give daily if no BM).      ceFAZolin 2-3 GM-% Solr   Commonly known  as: ANCEF   Inject 50 mLs (2 g total) into the vein every 8 (eight) hours. Through Nov 07, 2012      colchicine 0.6 MG tablet   Take 1 tablet (0.6 mg total) by mouth 2 (two) times daily.      DSS 100 MG Caps   Take 200 mg by mouth daily.      feeding supplement Liqd   Take 30 mLs by mouth 2 (two) times daily.      feeding supplement Liqd   Take 237 mLs by mouth 2 (two) times daily between meals.      iron polysaccharides 150 MG capsule   Commonly known as: NIFEREX   Take 1 capsule (150 mg total) by mouth daily.      metoprolol tartrate 12.5 mg Tabs   Commonly known as: LOPRESSOR   Take 0.5 tablets (12.5 mg total) by mouth 2 (two) times daily.      oxyCODONE 5 MG immediate release tablet   Commonly known as: Oxy IR/ROXICODONE   Take 1-2 tablets (5-10 mg total) by mouth every 3 (three) hours as needed.      pantoprazole 40 MG tablet   Commonly known as: PROTONIX   Take 1 tablet (40 mg total) by mouth daily before breakfast.      potassium chloride SA 20 MEQ tablet   Commonly known as: K-DUR,KLOR-CON   Take 2 tablets (40 mEq total) by mouth daily.      traMADol 50 MG tablet   Commonly known as: ULTRAM   Take 1 tablet (50 mg total) by mouth every 4 (four) hours as needed.      warfarin 2.5 MG tablet   Commonly known as: COUMADIN   Take 1 tablet (2.5 mg total) by mouth one time only at 6 PM.      STOP taking these medications          valsartan 80 MG tablet   Commonly known as: DIOVAN          Where to get your medications    These are the prescriptions that you need to pick up. We sent them to a specific pharmacy, so you will need to go there to get them.   [id number] [address], [place] [address]    [place] [address]    Phone: [phone]        amiodarone 200 MG tablet   amLODipine 2.5 MG tablet   atorvastatin 40 MG tablet   bisacodyl 5 MG EC tablet   ceFAZolin 2-3 GM-% Solr   colchicine 0.6 MG tablet   DSS 100 MG Caps   feeding supplement Liqd   iron polysaccharides 150 MG capsule   metoprolol tartrate 12.5 mg Tabs   pantoprazole 40 MG tablet   potassium chloride SA 20 MEQ tablet   traMADol 50 MG tablet   warfarin 2.5 MG tablet         You may get these medications from any pharmacy.         oxyCODONE 5 MG immediate release tablet            Kenn File, MD of [facility] 10/06/2012 2:08 PM

## 2012-09-15 NOTE — Op Note (Signed)
NAME:  Ryan [name], Ryan [name] NO.:  [id number]  MEDICAL RECORD NO.:  [id number]  LOCATION:  [id number]                         FACILITY:  [facility]  PHYSICIAN:  [name], M.D.   DATE OF BIRTH:  Apr 23, 1946  DATE OF PROCEDURE:  09/14/2012 DATE OF DISCHARGE:                              OPERATIVE REPORT   PREOPERATIVE DIAGNOSIS:  Retained hardware, right great toe, status post previous surgical procedure.  POSTOPERATIVE DIAGNOSIS:  Retained hardware, right great toe, status post previous surgical procedure.  PRINCIPAL PROCEDURE: 1. Removal of hardware, right great toe. 2. Excision of chronic nonhealing wound.  SURGEON:  [name], M.D.  ASSISTANT:  None.  ANESTHESIA:  General.  BRIEF HISTORY:  Mr. Ryan [name] is a 66 year old male with a history of having been admitted with sepsis.  We were consulted because of the small nonhealing wound over the right great toe.  Evaluation at that time felt that there was really a nonhealing wound, but it certainly did not appear to be angry or in any way related to infection or infectious etiology.  We had a long talk with him about the wound and felt that it could be addressed as an outpatient.  I took another series of x-rays which just basically showed that he had a hardware, which was prominent right under this wound and I felt that, that was probably the reason it was not healing.  There were some discussion about vascular issues and wanting to do big vascular surgery to get this wound to heal, and I felt like once we found out there was prominent hardware, that would be reasonable to remove this hardware to see if we can get the wound to heal.  We talked about doing that if he was taken to the operating room for any other reason.  Unfortunately, they decided to take him to the operating room for his wrist, which was considered to be infected and we were consulted to discuss the situation with him.  At that point, I had a long  discussion with he and his family about treatment options.  I felt that removal of this hardware was reasonable, but they certainly understood that there was a possibility that he could break the wound down that he could have a much worse wound and ultimately could go on to need significant vascular intervention or even amputation of the toe. 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  So, they understood that as well.  After long discussion with he, his wife and his son, they ultimately elected to undergo this procedure and he was brought to the operating room for this procedure.  PROCEDURE:  The patient was brought to the operating room and after adequate level of anesthesia was obtained with general anesthetic, the patient was placed supine on the operating table.  The right leg was then prepped and draped in usual sterile fashion.  Following this, the leg was exsanguinated and blood pressure tourniquet was inflated to 250 mmHg.  Following this, a curved incision was made down as old incision and extending across the nonhealing ulcerated area.  We actually ellipsed that entire  area.  Once this was done, the skin was undermined, the prominent hardware was basically sitting right dead center in the ulcerated area.  This came out quite easily.  Once this was completed, attention was turned towards the more proximal hardware and this was easily identifiable and this was removed.  The wound was copiously and thoroughly irrigated at this point and then closed with interrupted nylon suture.  Sterile compressive dressing was applied, and the patient was ultimately taken to the recovery room, and he was noted to be in satisfactory condition after Dr. Fredna Dow finished his wrist surgery.  The patient was then taken to the operating room, was noted to be in stable condition.     [name], M.D.     Corliss Skains  D:  09/14/2012  T:   09/15/2012  Job:  [id number]

## 2012-09-15 NOTE — Progress Notes (Signed)
[place] [facility] Team ([facility]) note:  Staphylococcus aureus bacteremia (SAB) is associated with a high rate of complications and mortality.  Specific aspects of clinical management are critical to optimizing the outcome of patients with SAB.  Therefore, the [facility]imicrobial Management Team ([facility]) have initiated an intervention aimed at improving the management of SAB at [facility].  To do so, Infectious Diseases Consultants are providing evidence-based recommendations for the management of all patients with SAB.  The specific recommendations for this patient are marked "X" in this document.  Recommended [x]   Completed 09/14/2012 Perform follow-up blood cultures (even if the patient is afebrile) to ensure clearance of bacteremia.  Recommended [  ]  Completed [date]   Remove vascular catheter, and obtain follow-up cultures after removal of catheter  Recommended [ X ]  Completed [date]   Perform echocardiography to evaluate for endocarditis (transthoracic ECHO is 40-50% sensitive, TEE is > 90% sensitive).*  Please keep in mind, that neither test can definitively EXCLUDE endocarditis, and that should clinical suspicion remain high for endocarditis the patient should then still be treated with an "endocarditis" duration of therapy = 6 weeks  Recommended [  ]  Completed [date]   Consult electrophysiologist to evaluate implanted cardiac device (pacemaker, ICD)  Recommended Valu.Nieves ]  Completed 09/14/2012    Ensure source control.  Right wrist debrided by Dr. Fredna Dow.  Right great toe hardware removed.  Have all abscesses been drained effectively? Have deep seeded infections (septic joints or osteomyelitis) had appropriate surgical debridement?  Right wrist debrided by Dr. Fredna Dow.   Right great toe hardware removed.   Recommended [ X ]  Completed [date]   Investigate for "metastatic" sites of infection.   Does the patient have ANY symptom or physical exam finding that would  suggest a deeper infection (back or neck pain that may be suggestive of vertebral osteomyelitis or epidural abscess, muscle pain that could be a symptom of pyomyositis)?  MRI of head and neck pending  Keep in mind that for deep seeded infections MRI imaging with contrast is preferred rather than other often insensitive tests such as plain x-rays, especially early in a patient's presentation.   Recommended Valu.Nieves ]  Completed 09/15/2012  Change antibiotic regimen to Cefazolin 2g IV q8h.   Valu.Nieves ]  Estimated duration of IV  Antibiotic therapy:  6 weeks (dependent on diagnostic test results).    [X]   Consult case management for probable prolonged outpatient intravenous antibiotic therapy.  Heide Guile, PharmD, BCPS Clinical Pharmacist Pager [phone]

## 2012-09-15 NOTE — Consult Note (Signed)
Got the message about arranging TEE. This will be scheduled for early next week. Hopefully Monday some time.

## 2012-09-15 NOTE — Progress Notes (Signed)
  Echocardiogram 2D Echocardiogram has been performed.  Ryan [name] 09/15/2012, 11:53 AM

## 2012-09-15 NOTE — Evaluation (Signed)
Speech Language Pathology Evaluation Patient Details Name: Ryan [name] MRN: [id number] DOB: September 02, 1946 Today's Date: 09/15/2012 Time: [id number] SLP Time Calculation (min): 30 min  Problem List:  Patient Active Problem List  Diagnosis  . Fever  . Peripheral vascular disease  . UTI (urinary tract infection)  . Sepsis with metabolic encephalopathy  . Septic arthritis of wrist, right  . Acute ischemic stroke   Past Medical History:  Past Medical History  Diagnosis Date  . Hypertension    Past Surgical History: History reviewed. No pertinent past surgical history. HPI:  66 y.o. year old male with PMHx of known HTN who presented with AMS, and found to have a right wrist septic joint and MSSA bacteremia. MD stated that likely cause of AMS was infection, but could not r/o possibilty of: baseline dementia, ETOH or drug abuse (pt. denied), or seizure disorder. SLE ordered to determine patient's current cognitive-linguistic functioning.    Assessment / Plan / Recommendation Clinical Impression  Patient appears to be functioning at or very near-baseline cognitive function as per this assessment. He did not exhibit any deficits of safety awareness, basic problem solving/reasoning, he was oriented x4 and independently recalled his RN's name as well as specific names of procedures/tests that had been completed during this hospital stay. He did exhibit a mild delay in verbal processing and thought organization at complex levels, however this is likely attributed to patient's fatigue and this SLP expects patient to continue to improve without direct intervention of speech-language pathologist.     SLP Assessment  Patient does not need any further Speech Lanaguage Pathology Services    Follow Up Recommendations  None;Other (comment) (OP SLP if pt. exhibits cognitive deficits post-hospital d/c)    Frequency and Duration   N/A     Pertinent Vitals/Pain No changes in vitals, no pt. 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 fatigued) Executive Function: Initiating Initiating: Impaired Initiating Impairment: Verbal complex (required extra processing time) Safety/Judgment: Appears intact Comments: Patient's spouse stated that "Today's the best day he's had; before that he's been sleeping and on pain medication"    Comprehension  Auditory Comprehension Overall Auditory Comprehension: Appears within functional limits for tasks assessed Yes/No Questions: Within Functional Limits Commands: Within Functional Limits Conversation: Complex Interfering Components: Processing speed EffectiveTechniques: Extra processing time Reading Comprehension Reading Status: Impaired Word level: Within functional limits Sentence Level: Impaired Paragraph Level: Not tested Functional Environmental (signs, name badge): Within functional limits Interfering Components: Eye glasses not available Effective Techniques: Large print    Expression Expression Primary Mode of Expression: Verbal Verbal Expression Overall Verbal Expression: Appears within functional  limits for tasks assessed Written Expression Dominant Hand: Right   Oral / Motor Oral Motor/Sensory Function Overall Oral Motor/Sensory Function: Appears within functional limits for tasks assessed   GO     Ryan [name] 09/15/2012, 5:54 PM  Ryan Baller, MA, [place] Speech-Language Pathologist

## 2012-09-15 NOTE — Progress Notes (Signed)
Subjective: 1 Day Post-Op Procedure(s) (LRB): IRRIGATION AND DEBRIDEMENT EXTREMITY (Right) REMOVAL K-WIRE/PIN EXTREMITY (Right) Patient reports pain as 1 on 0-10 scale.   No significant pain in right foot. Objective: Vital signs in last 24 hours: Temp:  [99.5 F (37.5 C)-102 F (38.9 C)] 101 F (38.3 C) (12/27 1204) Pulse Rate:  [105-117] 108  (12/27 0716) Resp:  [18-29] 29  (12/27 0716) BP: (105-180)/(57-88) 106/59 mmHg (12/27 0716) SpO2:  [93 %-99 %] 95 % (12/27 0716)  Intake/Output from previous day: 12/26 0701 - 12/27 0700 In: 377.5 [I.V.:127.5; IV Piggyback:250] Out: [id number] [Urine:1325; Stool:1; Blood:30] Intake/Output this shift: Total I/O In: 25 [IV Piggyback:25] Out: 50 [Urine:50]   Basename 09/15/12 1150 09/14/12 0435 09/13/12 0847  HGB 9.5* 9.4* 9.7*    Basename 09/15/12 1150 09/14/12 0435  WBC 14.4* 12.8*  RBC 4.24 4.21*  HCT 27.3* 27.2*  PLT 202 184    Basename 09/15/12 1150 09/14/12 1827  NA 132* 134*  K 3.7 4.4  CL 100 102  CO2 18* 20  BUN 19 20  CREATININE 0.98 1.04  GLUCOSE 116* 96  CALCIUM 8.1* 8.3*    Basename 09/13/12 0847  LABPT --  INR 1.25  Right foot exam: Dressing changed.  Right foot wound is benign.  No sign of infection.  Minimal swelling.  No redness.   Assessment/Plan: 1 Day Post-Op Procedure(s) (LRB): IRRIGATION AND DEBRIDEMENT EXTREMITY (Right) REMOVAL K-WIRE/PIN EXTREMITY (Right) Foot. Plan: Right foot dressing changed. He may weight-bear as tolerated on his right foot once he becomes mobile. Ryan Holm, PA-C/Dr. Lynann Bologna are covering over the weekend If needed.  Ryan [name] 09/15/2012, 1:39 PM

## 2012-09-15 NOTE — Anesthesia Postprocedure Evaluation (Signed)
Anesthesia Post Note  Patient: Ryan [name]  Procedure(s) Performed: Procedure(s) (LRB): IRRIGATION AND DEBRIDEMENT EXTREMITY (Right) REMOVAL K-WIRE/PIN EXTREMITY (Right)  Anesthesia type: general  Patient location: PACU  Post pain: Pain level controlled  Post assessment: Patient's Cardiovascular Status Stable  Last Vitals:  Filed Vitals:   09/15/12 0700  BP:   Pulse:   Temp: 37.6 C  Resp:     Post vital signs: Reviewed and stable  Level of consciousness: sedated  Complications: No apparent anesthesia complications

## 2012-09-15 NOTE — Consult Note (Signed)
Reason for Consult:cervical discitis Referring Physician: DR Ryan [name] is an 66 y.o. male.  HPI: patient transferred from [facility] [name] because question of rle ischemia. Patient was admitted to the hospital and he had been seen by vascular surgery, orthopedics, hand surgery , infectious disease and now by neurosurgery because of findings in the mri of the cervical spine. Patient is alert ,oriented, denies any weakness or sensory changes althouht he complains of pain in right arm and r leg. Minimal neck pain and no lumbar pain   Past Medical History  Diagnosis Date  . Hypertension     History reviewed. No pertinent past surgical history.  History reviewed. No pertinent family history.  Social History:  reports that he has never smoked. He does not have any smokeless tobacco history on file. He reports that he does not drink alcohol. His drug history not on file.  Allergies: No Known Allergies  Medications: see chart  Results for orders placed during the hospital encounter of 09/12/12 (from the past 48 hour(s))  HEPARIN LEVEL (UNFRACTIONATED)     Status: Abnormal   Collection Time   09/13/12 12:34 PM      Component Value Range Comment   Heparin Unfractionated <0.10 (*) 0.30 - 0.70 IU/mL   SEDIMENTATION RATE     Status: Abnormal   Collection Time   09/13/12 12:34 PM      Component Value Range Comment   Sed Rate 77 (*) 0 - 16 mm/hr   URIC ACID     Status: Normal   Collection Time   09/13/12 12:34 PM      Component Value Range Comment   Uric Acid, Serum 6.7  4.0 - 7.8 mg/dL   CK     Status: Abnormal   Collection Time   09/13/12 12:34 PM      Component Value Range Comment   Total CK 2336 (*) 7 - 232 U/L   GLUCOSE, CAPILLARY     Status: Abnormal   Collection Time   09/13/12 10:03 PM      Component Value Range Comment   Glucose-Capillary 144 (*) 70 - 99 mg/dL    Comment 1 Notify RN      Comment 2 Documented in Chart     HEPARIN LEVEL (UNFRACTIONATED)     Status:  Abnormal   Collection Time   09/13/12 10:42 PM      Component Value Range Comment   Heparin Unfractionated 0.11 (*) 0.30 - 0.70 IU/mL   SEDIMENTATION RATE     Status: Abnormal   Collection Time   09/14/12  4:25 AM      Component Value Range Comment   Sed Rate 81 (*) 0 - 16 mm/hr   HEMOGLOBIN A1C     Status: Abnormal   Collection Time   09/14/12  4:25 AM      Component Value Range Comment   Hemoglobin A1C 6.5 (*) <5.7 %    Mean Plasma Glucose 140 (*) <117 mg/dL   GLUCOSE, CAPILLARY     Status: Abnormal   Collection Time   09/14/12  4:27 AM      Component Value Range Comment   Glucose-Capillary 102 (*) 70 - 99 mg/dL    Comment 1 Notify RN      Comment 2 Documented in Chart     HEPARIN LEVEL (UNFRACTIONATED)     Status: Abnormal   Collection Time   09/14/12  4:35 AM      Component Value Range Comment  Heparin Unfractionated 0.15 (*) 0.30 - 0.70 IU/mL   CBC     Status: Abnormal   Collection Time   09/14/12  4:35 AM      Component Value Range Comment   WBC 12.8 (*) 4.0 - 10.5 K/uL    RBC 4.21 (*) 4.22 - 5.81 MIL/uL    Hemoglobin 9.4 (*) 13.0 - 17.0 g/dL    HCT 27.2 (*) 39.0 - 52.0 %    MCV 64.6 (*) 78.0 - 100.0 fL    MCH 22.3 (*) 26.0 - 34.0 pg    MCHC 34.6  30.0 - 36.0 g/dL    RDW 14.8  11.5 - 15.5 %    Platelets 184  150 - 400 K/uL   GLUCOSE, CAPILLARY     Status: Abnormal   Collection Time   09/14/12  7:48 AM      Component Value Range Comment   Glucose-Capillary 135 (*) 70 - 99 mg/dL    Comment 1 Documented in Chart      Comment 2 Notify RN     WOUND CULTURE     Status: Normal (Preliminary result)   Collection Time   09/14/12  2:04 PM      Component Value Range Comment   Specimen Description WOUND WRIST RIGHT      Special Requests PATIENT ON FOLLOWING ZOSYN,VANCOMYCIN      Gram Stain        Value: FEW WBC PRESENT, PREDOMINANTLY PMN     NO SQUAMOUS EPITHELIAL CELLS SEEN     NO ORGANISMS SEEN   Culture Culture reincubated for better growth      Report Status  PENDING     ANAEROBIC CULTURE     Status: Normal (Preliminary result)   Collection Time   09/14/12  2:04 PM      Component Value Range Comment   Specimen Description WOUND WRIST RIGHT      Special Requests PATIENT ON FOLLOWING ZOSYN,VANCOMYCIN      Gram Stain PENDING      Culture        Value: NO ANAEROBES ISOLATED; CULTURE IN PROGRESS FOR 5 DAYS   Report Status PENDING     HEPARIN LEVEL (UNFRACTIONATED)     Status: Abnormal   Collection Time   09/14/12  6:27 PM      Component Value Range Comment   Heparin Unfractionated 0.27 (*) 0.30 - 0.70 IU/mL   BASIC METABOLIC PANEL     Status: Abnormal   Collection Time   09/14/12  6:27 PM      Component Value Range Comment   Sodium 134 (*) 135 - 145 mEq/L    Potassium 4.4  3.5 - 5.1 mEq/L    Chloride 102  96 - 112 mEq/L    CO2 20  19 - 32 mEq/L    Glucose, Bld 96  70 - 99 mg/dL    BUN 20  6 - 23 mg/dL DELTA CHECK NOTED   Creatinine, Ser 1.04  0.50 - 1.35 mg/dL    Calcium 8.3 (*) 8.4 - 10.5 mg/dL    GFR calc non Af Amer 72 (*) >90 mL/min    GFR calc Af Amer 83 (*) >90 mL/min   HIV ANTIBODY (ROUTINE TESTING)     Status: Normal   Collection Time   09/14/12  6:28 PM      Component Value Range Comment   HIV NON REACTIVE  NON REACTIVE   CULTURE, BLOOD (ROUTINE X 2)     Status:  Normal (Preliminary result)   Collection Time   09/14/12  6:30 PM      Component Value Range Comment   Specimen Description BLOOD LEFT FOOT      Special Requests BOTTLES DRAWN AEROBIC ONLY 3CC      Culture  Setup Time 09/14/2012 23:48      Culture        Value:        BLOOD CULTURE RECEIVED NO GROWTH TO DATE CULTURE WILL BE HELD FOR 5 DAYS BEFORE ISSUING A FINAL NEGATIVE REPORT   Report Status PENDING       Ryan [name]  09/15/2012  *RADIOLOGY REPORT*  Clinical Data:  Altered mental status.  Pyelonephritis possibly with urosepsis.  MRI [name] WITHOUT [name] MRA HEAD WITHOUT [name]  Technique: Multiplanar, multiecho pulse sequences of the [name] and  surrounding structures were obtained according to standard protocol without intravenous [name].  Angiographic images of the head were obtained using MRA technique without [name].  Comparison: 09/25/2008 head CT.  No comparison Ryan.  MRI HEAD  Findings:  Motion degraded exam.  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  Tiny small acute infarcts involving the posterior left temporal lobe and left parietal - occipital junction.  Tiny acute infarcts left cerebellum and right cerebellar tonsil.  This distribution of infarcts raises possibility of embolic disease.  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  No intracranial hemorrhage.  Mild small vessel disease type changes.  No intracranial mass lesion detected on this unenhanced exam.  No hydrocephalus.  Opacification in the right mastoid air cells and right middle ear. No obstructing lesion is noted in the region of the posterior- superior nasopharynx causing eustachian tube dysfunction.  No findings of thrombosis of the adjacent dural sinuses.  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  Tiny small acute infarcts involving the posterior left temporal lobe and left parietal - occipital junction.  Tiny acute infarcts left cerebellum and right cerebellar tonsil.  This distribution of infarcts raises possibility of embolic disease.  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  Opacification right mastoid air cells and right middle ear as noted above.  MRA HEAD   Findings: Anterior circulation without medium or large size vessel significant stenosis or occlusion.  Mild narrowing involving portions of the A1 segment of the anterior cerebral artery bilaterally and the M1 segment of the middle cerebral artery bilaterally.  Mild branch vessel irregularity middle cerebral artery and anterior cerebral artery distribution.  Codominant vertebral arteries.  No high-grade stenosis of the distal vertebral arteries.  Very mild narrowing and irregularity mid aspect of the basilar artery.  Poor delineation AICAs.  Mild irregularity and narrowing involving portions of the superior cerebellar arteries.  No aneurysmal or vascular malformation noted.  IMPRESSION: Branch vessel atherosclerotic type changes as noted above.  MRI CERVICAL SPINE WITHOUT [name]  Technique:  Multiplanar and multiecho pulse sequences of the cervical spine, to include the craniocervical junction and cervicothoracic junction, were obtained without intravenous [name].  Comparison:   None  Findings:  Motion degraded exam.  Abnormal appearance of the C6-7 vertebra and disc space highly suspicious for a diskitis/osteomyelitis with abnormal appearance of the C6-7 and C7-T1 facet joints extending to involve the left T1 pedicle suspicious for involvement by infection.  Epidural abscess suspected although evaluation limited by motion and the fact that no [name] was administered.  This contributes to spinal stenosis and mild cord flattening (most notable C6 level).  Extension of infectious process into the neural foramen on the right at the C5-6 and C6 level and on the left at the C5-6 through C7-T1 level.  Abnormal signal interspinous region most notable C5-6 level with extending from the C2 level to the C7 level.  Findings suspicious for spread of infection.  There is also edema within paraspinal musculature throughout the posterior neck consistent with cellulitis.  Slight increased signal within the C6 and C7 spinous  process suspicious for involvement by by infection.  No definitive cervical cord signal abnormality noted on this motion degraded exam.  Both vertebral arteries and carotid arteries are patent.  C2-3:  Bulge.  Facet joint degenerative changes.  Minimal uncinate hypertrophy.  C3-4:  Broad-based disc osteophyte complex.  Moderate spinal stenosis.  Mild cord flattening.  Moderate bilateral foraminal narrowing.  C4-5:  Bulge with tiny central protrusion.  Mild spinal stenosis. Minimal bilateral foraminal narrowing.  C5-6:  Bulge/broad-based protrusion.  Mild spinal stenosis.  Mild cord flattening.  Mild bilateral foraminal narrowing.  C6-7:  Diskitis as noted above.  C7-T1:  Facet joint degenerative changes.  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  Epidural abscess suspected although evaluation limited without [name].  This contributes to spinal stenosis most prominent C6 level.  Cervical spondylotic changes most notable C3-4 level.  Please see above further detail.  Critical Value/emergent results were called by telephone at the time of interpretation on 09/15/2012 at 11:09 am to Dr. McDiarmid, who verbally acknowledged these results.   Original Report Authenticated By: Genia Del, M.D.    Ryan [name] Wo [name]  09/15/2012  *RADIOLOGY REPORT*  Clinical Data:  Altered mental status.  Pyelonephritis possibly with urosepsis.  MRI [name] WITHOUT [name] MRA HEAD WITHOUT [name]  Technique: Multiplanar, multiecho pulse sequences of the [name] and surrounding structures were obtained according to standard protocol without intravenous [name].  Angiographic images of the head were obtained using MRA technique without [name].  Comparison: 09/25/2008 head CT.  No comparison Ryan.  MRI HEAD  Findings:  Motion degraded exam.  Several small areas of acute infarction involving the right  hemisphere including portions of the; right occipital lobe, right temporal lobe, right opercular region, posterior right frontal lobe and right parietal lobe.  Tiny small acute infarcts involving the posterior left temporal lobe and left parietal - occipital junction.  Tiny acute infarcts left cerebellum and right cerebellar tonsil.  This distribution of infarcts raises possibility of embolic disease.  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  No intracranial hemorrhage.  Mild small vessel disease type changes.  No intracranial mass lesion detected on this unenhanced exam.  No hydrocephalus.  Opacification in the right mastoid air cells and right middle ear. No obstructing lesion is noted in the region of the posterior- superior nasopharynx causing eustachian tube dysfunction.  No findings of thrombosis of the adjacent dural sinuses.  IMPRESSION:  Several small areas of acute infarction involving the right hemisphere including  portions of the; right occipital lobe, right temporal lobe, right opercular region, posterior right frontal lobe and right parietal lobe.  Tiny small acute infarcts involving the posterior left temporal lobe and left parietal - occipital junction.  Tiny acute infarcts left cerebellum and right cerebellar tonsil.  This distribution of infarcts raises possibility of embolic disease.  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  Opacification right mastoid air cells and right middle ear as noted above.  MRA HEAD  Findings: Anterior circulation without medium or large size vessel significant stenosis or occlusion.  Mild narrowing involving portions of the A1 segment of the anterior cerebral artery bilaterally and the M1 segment of the middle cerebral artery bilaterally.  Mild branch vessel irregularity middle cerebral artery and anterior cerebral  artery distribution.  Codominant vertebral arteries.  No high-grade stenosis of the distal vertebral arteries.  Very mild narrowing and irregularity mid aspect of the basilar artery.  Poor delineation AICAs.  Mild irregularity and narrowing involving portions of the superior cerebellar arteries.  No aneurysmal or vascular malformation noted.  IMPRESSION: Branch vessel atherosclerotic type changes as noted above.  MRI CERVICAL SPINE WITHOUT [name]  Technique:  Multiplanar and multiecho pulse sequences of the cervical spine, to include the craniocervical junction and cervicothoracic junction, were obtained without intravenous [name].  Comparison:   None  Findings:  Motion degraded exam.  Abnormal appearance of the C6-7 vertebra and disc space highly suspicious for a diskitis/osteomyelitis with abnormal appearance of the C6-7 and C7-T1 facet joints extending to involve the left T1 pedicle suspicious for involvement by infection.  Epidural abscess suspected although evaluation limited by motion and the fact that no [name] was administered.  This contributes to spinal stenosis and mild cord flattening (most notable C6 level).  Extension of infectious process into the neural foramen on the right at the C5-6 and C6 level and on the left at the C5-6 through C7-T1 level.  Abnormal signal interspinous region most notable C5-6 level with extending from the C2 level to the C7 level.  Findings suspicious for spread of infection.  There is also edema within paraspinal musculature throughout the posterior neck consistent with cellulitis.  Slight increased signal within the C6 and C7 spinous process suspicious for involvement by by infection.  No definitive cervical cord signal abnormality noted on this motion degraded exam.  Both vertebral arteries and carotid arteries are patent.  C2-3:  Bulge.  Facet joint degenerative changes.  Minimal uncinate hypertrophy.  C3-4:  Broad-based disc osteophyte complex.  Moderate spinal  stenosis.  Mild cord flattening.  Moderate bilateral foraminal narrowing.  C4-5:  Bulge with tiny central protrusion.  Mild spinal stenosis. Minimal bilateral foraminal narrowing.  C5-6:  Bulge/broad-based protrusion.  Mild spinal stenosis.  Mild cord flattening.  Mild bilateral foraminal narrowing.  C6-7:  Diskitis as noted above.  C7-T1:  Facet joint degenerative changes.  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  Epidural abscess suspected although evaluation limited without [name].  This contributes to spinal stenosis most prominent C6 level.  Cervical spondylotic changes most notable C3-4 level.  Please see above further detail.  Critical Value/emergent results were called by telephone at the time of interpretation on 09/15/2012 at 11:09 am to Dr. McDiarmid, who verbally acknowledged these results.   Original Report Authenticated By: Genia Del, M.D.    Ryan [name]  09/15/2012  *RADIOLOGY REPORT*  Clinical Data:  Altered mental status.  Pyelonephritis possibly with urosepsis.  MRI [name] WITHOUT [name] MRA HEAD WITHOUT [name]  Technique: Multiplanar, multiecho pulse sequences of the [name] and surrounding structures were obtained according to standard protocol without intravenous [name].  Angiographic images of the head were obtained using MRA technique without [name].  Comparison: 09/25/2008 head CT.  No comparison Ryan.  MRI HEAD  Findings:  Motion degraded exam.  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  Tiny small acute infarcts involving the posterior left temporal lobe and left parietal - occipital junction.  Tiny acute infarcts left cerebellum and right cerebellar  tonsil.  This distribution of infarcts raises possibility of embolic disease.  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  No intracranial hemorrhage.  Mild small vessel disease type changes.  No intracranial mass lesion detected on this unenhanced exam.  No hydrocephalus.  Opacification in the right mastoid air cells and right middle ear. No obstructing lesion is noted in the region of the posterior- superior nasopharynx causing eustachian tube dysfunction.  No findings of thrombosis of the adjacent dural sinuses.  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  Tiny small acute infarcts involving the posterior left temporal lobe and left parietal - occipital junction.  Tiny acute infarcts left cerebellum and right cerebellar tonsil.  This distribution of infarcts raises possibility of embolic disease.  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  Opacification right mastoid air cells and right middle ear as noted above.  MRA HEAD  Findings: Anterior circulation without medium or large size vessel significant stenosis or occlusion.  Mild narrowing involving portions of the A1 segment of the anterior cerebral artery bilaterally and the M1 segment of the middle cerebral artery bilaterally.  Mild branch vessel irregularity middle cerebral artery and anterior cerebral artery distribution.  Codominant vertebral arteries.  No high-grade stenosis of the distal vertebral arteries.  Very mild narrowing and irregularity mid aspect of the basilar artery.  Poor delineation AICAs.  Mild irregularity and narrowing involving portions of the superior cerebellar arteries.  No aneurysmal or vascular  malformation noted.  IMPRESSION: Branch vessel atherosclerotic type changes as noted above.  MRI CERVICAL SPINE WITHOUT [name]  Technique:  Multiplanar and multiecho pulse sequences of the cervical spine, to include the craniocervical junction and cervicothoracic junction, were obtained without intravenous [name].  Comparison:   None  Findings:  Motion degraded exam.  Abnormal appearance of the C6-7 vertebra and disc space highly suspicious for a diskitis/osteomyelitis with abnormal appearance of the C6-7 and C7-T1 facet joints extending to involve the left T1 pedicle suspicious for involvement by infection.  Epidural abscess suspected although evaluation limited by motion and the fact that no [name] was administered.  This contributes to spinal stenosis and mild cord flattening (most notable C6 level).  Extension of infectious process into the neural foramen on the right at the C5-6 and C6 level and on the left at the C5-6 through C7-T1 level.  Abnormal signal interspinous region most notable C5-6 level with  extending from the C2 level to the C7 level.  Findings suspicious for spread of infection.  There is also edema within paraspinal musculature throughout the posterior neck consistent with cellulitis.  Slight increased signal within the C6 and C7 spinous process suspicious for involvement by by infection.  No definitive cervical cord signal abnormality noted on this motion degraded exam.  Both vertebral arteries and carotid arteries are patent.  C2-3:  Bulge.  Facet joint degenerative changes.  Minimal uncinate hypertrophy.  C3-4:  Broad-based disc osteophyte complex.  Moderate spinal stenosis.  Mild cord flattening.  Moderate bilateral foraminal narrowing.  C4-5:  Bulge with tiny central protrusion.  Mild spinal stenosis. Minimal bilateral foraminal narrowing.  C5-6:  Bulge/broad-based protrusion.  Mild spinal stenosis.  Mild cord flattening.  Mild bilateral foraminal narrowing.  C6-7:  Diskitis as noted  above.  C7-T1:  Facet joint degenerative changes.  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  Epidural abscess suspected although evaluation limited without [name].  This contributes to spinal stenosis most prominent C6 level.  Cervical spondylotic changes most notable C3-4 level.  Please see above further detail.  Critical Value/emergent results were called by telephone at the time of interpretation on 09/15/2012 at 11:09 am to Dr. McDiarmid, who verbally acknowledged these results.   Original Report Authenticated By: Genia Del, M.D.    Dg Foot Complete Right  09/14/2012  *RADIOLOGY REPORT*  Clinical Data: Pain  RIGHT FOOT COMPLETE - 3+ VIEW  Comparison: Yesterday  Findings: A BB has been utilized to mark the scan location of pain. There is corresponds to the area of skin just dorsal to the the wire is transfixed in the distal first metatarsal.  Stable hardware compared with yesterday.  No new fracture.  Degenerative changes at the first metatarsophalangeal joint.  Central erosions involving the first metatarsal head are stable.  No aggressive periosteal reaction.  IMPRESSION: A skin BB marks the location of pain overlying the hardware in the distal first metatarsal.  Stable degenerative and postoperative changes.   Original Report Authenticated By: Marybelle Killings, M.D.     Review of Systems  Constitutional: Positive for fever and chills.  HENT: Negative.   Eyes: Negative.   Respiratory: Negative.   Cardiovascular: Negative.   Gastrointestinal: Negative.   Genitourinary: Positive for dysuria.  Musculoskeletal: Positive for joint pain.  Skin: Negative.   Neurological: Negative.   Psychiatric/Behavioral: Negative.    Blood pressure 105/57, pulse 105, temperature 101 F (38.3 C), temperature source Oral, resp. rate 24, height 6' (1.829 m), weight 84.4 kg (186 lb  1.1 oz), SpO2 95.00%. Physical ExamNEURO ORIENTED X3. SENSORY NORMAL. STRENGTH  Right arm on a slig post op. Right foot covered with bandage. Able to move all 4 extremities with some restrictions in the right arm and leg. Mri brai showed multiple level of acute infarct most likely secondary to [place]. Mri cervical spine showed discitis at cervical 67 with question of epidural abscess (poor quality view)  Assessment/Plan: Observation. No need for surgical intervention in the cervical area at present. Seen by ID. STROKE TEAM INPUT FOR ACUTE CVA?  WILL F/U  Aashritha Miedema M 09/15/2012, 12:19 PM

## 2012-09-15 NOTE — Progress Notes (Signed)
Subjective: 1 Day Post-Op Procedure(s) (LRB): IRRIGATION AND DEBRIDEMENT EXTREMITY (Right) REMOVAL K-WIRE/PIN EXTREMITY (Right) Some pain in right wrist.  Right arm elevated in mission sling.    Objective: Vital signs in last 24 hours: Temp:  [99.5 F (37.5 C)-102 F (38.9 C)] 99.6 F (37.6 C) (12/27 0700) Pulse Rate:  [105-117] 105  (12/27 0423) Resp:  [18-25] 24  (12/27 0423) BP: (105-180)/(57-88) 105/57 mmHg (12/27 0423) SpO2:  [93 %-99 %] 95 % (12/27 0423)  Intake/Output from previous day: 12/26 0701 - 12/27 0700 In: 358 [I.V.:108; IV Piggyback:250] Out: 1356 [Urine:1325; Stool:1; Blood:30] Intake/Output this shift:     Basename 09/14/12 0435 09/13/12 0847  HGB 9.4* 9.7*    Basename 09/14/12 0435 09/13/12 0847  WBC 12.8* 12.0*  RBC 4.21* 4.28  HCT 27.2* 28.5*  PLT 184 158    Basename 09/14/12 1827 09/13/12 0856  NA 134* 138  K 4.4 4.4  CL 102 106  CO2 20 21  BUN 20 31*  CREATININE 1.04 1.17  GLUCOSE 96 122*  CALCIUM 8.3* 8.5    Basename 09/13/12 0847  LABPT --  INR 1.25    intact sensation and capillary refill all digits.  +epl/fpl/io.  dressing clean, dry, intact.  no proximal streaking.  Assessment/Plan: 1 Day Post-Op Procedure(s) (LRB): IRRIGATION AND DEBRIDEMENT EXTREMITY (Right) REMOVAL K-WIRE/PIN EXTREMITY (Right) Plan to start hydrotherapy with packing changes tomorrow.  ABX per ID.  Maintain elevation right arm.  Cultures from wrist negative so far.  WBC from today pending.  Najiyah Paris R 09/15/2012, 11:51 AM

## 2012-09-15 NOTE — Progress Notes (Signed)
INFECTIOUS DISEASE PROGRESS NOTE  ID: Jashaun Dunster is a 66 y.o. male with  Principal Problem:  *Fever  Subjective: Some pain in his hand  Abtx:  Anti-infectives     Start     Dose/Rate Route Frequency Ordered Stop   09/13/12 1200   vancomycin (VANCOCIN) 750 mg in sodium chloride 0.9 % 150 mL IVPB        750 mg 150 mL/hr over 60 Minutes Intravenous Every 12 hours 09/13/12 0409     09/13/12 0800  piperacillin-tazobactam (ZOSYN) IVPB 3.375 g       3.375 g 12.5 mL/hr over 240 Minutes Intravenous Every 8 hours 09/13/12 0409     09/12/12 2300   vancomycin (VANCOCIN) IVPB 1000 mg/200 mL premix        1,000 mg 200 mL/hr over 60 Minutes Intravenous  Once 09/12/12 2253 09/13/12 0128   09/12/12 2300  piperacillin-tazobactam (ZOSYN) IVPB 3.375 g       3.375 g 12.5 mL/hr over 240 Minutes Intravenous  Once 09/12/12 2253 09/13/12 0023          Medications:  Scheduled:   . atorvastatin  40 mg Oral q1800  . folic acid  1 mg Oral Daily  . multivitamin with minerals  1 tablet Oral Daily  . piperacillin-tazobactam (ZOSYN)  IV  3.375 g Intravenous Q8H  . sodium chloride  3 mL Intravenous Q12H  . thiamine  100 mg Oral Daily  . vancomycin  750 mg Intravenous Q12H    Objective: Vital signs in last 24 hours: Temp:  [99.5 F (37.5 C)-102 F (38.9 C)] 99.6 F (37.6 C) (12/27 0700) Pulse Rate:  [105-117] 105  (12/27 0423) Resp:  [18-26] 24  (12/27 0423) BP: (105-180)/(56-88) 105/57 mmHg (12/27 0423) SpO2:  [91 %-99 %] 95 % (12/27 0423)   General appearance: alert, cooperative and no distress Resp: clear to auscultation bilaterally Cardio: irregularly irregular rhythm GI: normal findings: bowel sounds normal and soft, non-tender Extremities: R hand wrapped, R foot wrapped.   Lab Results  Basename 09/14/12 1827 09/14/12 0435 09/13/12 0856 09/13/12 0847  WBC -- 12.8* -- 12.0*  HGB -- 9.4* -- 9.7*  HCT -- 27.2* -- 28.5*  NA 134* -- 138 --  K 4.4 -- 4.4 --  CL 102 -- 106  --  CO2 20 -- 21 --  BUN 20 -- 31* --  CREATININE 1.04 -- 1.17 --  GLU -- -- -- --   Liver Panel  Basename 09/13/12 0856  PROT 6.3  ALBUMIN 1.9*  AST 85*  ALT 57*  ALKPHOS 110  BILITOT 1.3*  BILIDIR --  IBILI --   Sedimentation Rate  Basename 09/14/12 0425  ESRSEDRATE 81*   C-Reactive Protein No results found for this basename: CRP:2 in the last 72 hours  Microbiology: Recent Results (from the past 240 hour(s))  URINE CULTURE     Status: Normal   Collection Time   09/12/12  8:55 PM      Component Value Range Status Comment   Specimen Description URINE, CATHETERIZED   Final    Special Requests CX ADDED AT 2127 ON [id number]   Final    Culture  Setup Time 09/12/2012 22:38   Final    Colony Count >=100,000 COLONIES/ML   Final    Culture ESCHERICHIA COLI   Final    Report Status 09/14/2012 FINAL   Final    Organism ID, Bacteria ESCHERICHIA COLI   Final   GRAM STAIN  Status: Normal   Collection Time   09/12/12  9:36 PM      Component Value Range Status Comment   Specimen Description SYNOVIAL FLUID RIGHT WRIST   Final    Special Requests Normal   Final    Gram Stain     Final    Value: ABUNDANT WBC PRESENT, PREDOMINANTLY PMN     FEW GRAM POSITIVE COCCI IN PAIRS IN CLUSTERS     Gram Stain Report Called to,Read Back By and Verified With: RN T. MITCHELL 09/12/12 [place]   Report Status 09/12/2012 FINAL   Final   BODY FLUID CULTURE     Status: Normal (Preliminary result)   Collection Time   09/12/12  9:36 PM      Component Value Range Status Comment   Specimen Description SYNOVIAL FLUID RIGHT WRIST   Final    Special Requests Normal   Final    Gram Stain     Final    Value: ABUNDANT WBC PRESENT, PREDOMINANTLY PMN     FEW GRAM POSITIVE COCCI IN PAIRS     IN CLUSTERS Performed at [facility] Gram Stain Report Called to,Read Back By and Verified With: Gram Stain Report Called to,Read Back By and Verified With: RN T.MITCHELL 09/12/12 2229 Jonita Albee M   Culture  STAPHYLOCOCCUS AUREUS   Final    Report Status PENDING   Incomplete   CULTURE, BLOOD (ROUTINE X 2)     Status: Normal   Collection Time   09/12/12 10:45 PM      Component Value Range Status Comment   Specimen Description BLOOD ARM LEFT   Final    Special Requests BOTTLES DRAWN AEROBIC AND ANAEROBIC 10CC   Final    Culture  Setup Time 09/13/2012 04:15   Final    Culture     Final    Value: STAPHYLOCOCCUS AUREUS     Note: RIFAMPIN AND GENTAMICIN SHOULD NOT BE USED AS SINGLE DRUGS FOR TREATMENT OF STAPH INFECTIONS.     Note: Gram Stain Report Called to,Read Back By and Verified With: WHITNEY DAVIS@1518  ON [id number] BY [facility]   Report Status 09/15/2012 FINAL   Final    Organism ID, Bacteria STAPHYLOCOCCUS AUREUS   Final   CULTURE, BLOOD (ROUTINE X 2)     Status: Normal   Collection Time   09/12/12 10:45 PM      Component Value Range Status Comment   Specimen Description BLOOD ARM RIGHT   Final    Special Requests BOTTLES DRAWN AEROBIC ONLY 10CC   Final    Culture  Setup Time 09/13/2012 04:15   Final    Culture     Final    Value: STAPHYLOCOCCUS AUREUS     Note: SUSCEPTIBILITIES PERFORMED ON PREVIOUS CULTURE WITHIN THE LAST 5 DAYS.     Note: Gram Stain Report Called to,Read Back By and Verified With: WHITNEY DAVIS@1518  ON 122513 BY [facility]   Report Status 09/15/2012 FINAL   Final   MRSA PCR SCREENING     Status: Normal   Collection Time   09/13/12  3:44 AM      Component Value Range Status Comment   MRSA by PCR NEGATIVE  NEGATIVE Final   WOUND CULTURE     Status: Normal (Preliminary result)   Collection Time   09/13/12  9:12 AM      Component Value Range Status Comment   Specimen Description WOUND RIGHT FOOT   Final    Special Requests Normal  Final    Gram Stain     Final    Value: NO WBC SEEN     FEW SQUAMOUS EPITHELIAL CELLS PRESENT     RARE GRAM POSITIVE COCCI IN PAIRS   Culture     Final    Value: MODERATE STAPHYLOCOCCUS AUREUS     Note: RIFAMPIN AND GENTAMICIN SHOULD NOT BE  USED AS SINGLE DRUGS FOR TREATMENT OF STAPH INFECTIONS.   Report Status PENDING   Incomplete   WOUND CULTURE     Status: Normal (Preliminary result)   Collection Time   09/14/12  2:04 PM      Component Value Range Status Comment   Specimen Description WOUND WRIST RIGHT   Final    Special Requests PATIENT ON FOLLOWING ZOSYN,VANCOMYCIN   Final    Gram Stain     Final    Value: FEW WBC PRESENT, PREDOMINANTLY PMN     NO SQUAMOUS EPITHELIAL CELLS SEEN     NO ORGANISMS SEEN   Culture Culture reincubated for better growth   Final    Report Status PENDING   Incomplete   ANAEROBIC CULTURE     Status: Normal (Preliminary result)   Collection Time   09/14/12  2:04 PM      Component Value Range Status Comment   Specimen Description WOUND WRIST RIGHT   Final    Special Requests PATIENT ON FOLLOWING ZOSYN,VANCOMYCIN   Final    Gram Stain PENDING   Incomplete    Culture     Final    Value: NO ANAEROBES ISOLATED; CULTURE IN PROGRESS FOR 5 DAYS   Report Status PENDING   Incomplete   CULTURE, BLOOD (ROUTINE X 2)     Status: Normal (Preliminary result)   Collection Time   09/14/12  6:30 PM      Component Value Range Status Comment   Specimen Description BLOOD LEFT FOOT   Final    Special Requests BOTTLES DRAWN AEROBIC ONLY 3CC   Final    Culture  Setup Time 09/14/2012 23:48   Final    Culture     Final    Value:        BLOOD CULTURE RECEIVED NO GROWTH TO DATE CULTURE WILL BE HELD FOR 5 DAYS BEFORE ISSUING A FINAL NEGATIVE REPORT   Report Status PENDING   Incomplete     Studies/Results: Dg Foot Complete Right  09/14/2012  *RADIOLOGY REPORT*  Clinical Data: Pain  RIGHT FOOT COMPLETE - 3+ VIEW  Comparison: Yesterday  Findings: A BB has been utilized to mark the scan location of pain. There is corresponds to the area of skin just dorsal to the the wire is transfixed in the distal first metatarsal.  Stable hardware compared with yesterday.  No new fracture.  Degenerative changes at the first  metatarsophalangeal joint.  Central erosions involving the first metatarsal head are stable.  No aggressive periosteal reaction.  IMPRESSION: A skin BB marks the location of pain overlying the hardware in the distal first metatarsal.  Stable degenerative and postoperative changes.   Original Report Authenticated By: Marybelle Killings, M.D.      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

## 2012-09-15 NOTE — Progress Notes (Signed)
PT HYDROTHERAPY   New orders received for start date of 12/28 with treatment every other day.  Will evaluate and begin therapy tomorrow 12/28 as indicated.  Alben Deeds, [place] DPT  [phone]

## 2012-09-15 NOTE — Progress Notes (Signed)
PGY-1 Daily Progress Note Family Medicine Teaching Service [place] R. Pau Banh, DO Service Pager: [phone]   Subjective: Pt c/o some pain in his R hand this AM after going for surgical debridement of the wrist yesterday.   Objective:  VITALS Temp:  [99.5 F (37.5 C)-102 F (38.9 C)] 99.6 F (37.6 C) (12/27 0700) Pulse Rate:  [105-117] 105  (12/27 0423) Resp:  [18-25] 24  (12/27 0423) BP: (105-180)/(57-88) 105/57 mmHg (12/27 0423) SpO2:  [93 %-99 %] 95 % (12/27 0423)  In/Out  Intake/Output Summary (Last 24 hours) at 09/15/12 1158 Last data filed at 09/15/12 0700  Gross per 24 hour  Intake    261 ml  Output    856 ml  Net   -595 ml   UOP : 1.1 mg/kg/hr   Physical Exam: General: NAD, pleasant HEENT:MMM, EOMI B/L, [place]/AT Heart: Tachycardic, RRR, no murmur  Lungs: CTA B/L  Abdomen: Soft NT/ND Extremities: R hand and foot wrapped  Skin: no rashes, erythema as noted above  Neurology: No focal neurologic deficits    MEDS Scheduled Meds:    . atorvastatin  40 mg Oral q1800  .  ceFAZolin (ANCEF) IV  2 g Intravenous [id number]  . folic acid  1 mg Oral Daily  . multivitamin with minerals  1 tablet Oral Daily  . sodium chloride  3 mL Intravenous Q12H  . thiamine  100 mg Oral Daily   Continuous Infusions:    . sodium chloride 125 mL/hr at 09/15/12 1100   PRN Meds:.acetaminophen, acetaminophen, HYDROcodone-acetaminophen, LORazepam, LORazepam, metoprolol, morphine injection, ondansetron (ZOFRAN) IV, ondansetron, polyethylene glycol  Labs and imaging:   CBC  Lab 09/14/12 0435 09/13/12 0847  WBC 12.8* 12.0*  HGB 9.4* 9.7*  HCT 27.2* 28.5*  PLT 184 158   BMET/CMET  Lab 09/14/12 1827 09/13/12 0856 09/13/12 0847  NA 134* 138 --  K 4.4 4.4 --  CL 102 106 --  CO2 20 21 --  BUN 20 31* --  CREATININE 1.04 1.17 1.18  CALCIUM 8.3* 8.5 --  PROT -- 6.3 --  BILITOT -- 1.3* --  ALKPHOS -- 110 --  ALT -- 57* --  AST -- 85* --  GLUCOSE 96 122* --   Results for orders placed  during the hospital encounter of 09/12/12 (from the past 24 hour(s))  WOUND CULTURE     Status: Normal (Preliminary result)   Collection Time   09/14/12  2:04 PM      Component Value Range   Specimen Description WOUND WRIST RIGHT     Special Requests PATIENT ON FOLLOWING ZOSYN,VANCOMYCIN     Gram Stain       Value: FEW WBC PRESENT, PREDOMINANTLY PMN     NO SQUAMOUS EPITHELIAL CELLS SEEN     NO ORGANISMS SEEN   Culture Culture reincubated for better growth     Report Status PENDING    ANAEROBIC CULTURE     Status: Normal (Preliminary result)   Collection Time   09/14/12  2:04 PM      Component Value Range   Specimen Description WOUND WRIST RIGHT     Special Requests PATIENT ON FOLLOWING ZOSYN,VANCOMYCIN     Gram Stain PENDING     Culture       Value: NO ANAEROBES ISOLATED; CULTURE IN PROGRESS FOR 5 DAYS   Report Status PENDING    HEPARIN LEVEL (UNFRACTIONATED)     Status: Abnormal   Collection Time   09/14/12  6:27 PM  Component Value Range   Heparin Unfractionated 0.27 (*) 0.30 - 0.70 IU/mL  BASIC METABOLIC PANEL     Status: Abnormal   Collection Time   09/14/12  6:27 PM      Component Value Range   Sodium 134 (*) 135 - 145 mEq/L   Potassium 4.4  3.5 - 5.1 mEq/L   Chloride 102  96 - 112 mEq/L   CO2 20  19 - 32 mEq/L   Glucose, Bld 96  70 - 99 mg/dL   BUN 20  6 - 23 mg/dL   Creatinine, Ser 1.04  0.50 - 1.35 mg/dL   Calcium 8.3 (*) 8.4 - 10.5 mg/dL   GFR calc non Af Amer 72 (*) >90 mL/min   GFR calc Af Amer 83 (*) >90 mL/min  HIV ANTIBODY (ROUTINE TESTING)     Status: Normal   Collection Time   09/14/12  6:28 PM      Component Value Range   HIV NON REACTIVE  NON REACTIVE  CULTURE, BLOOD (ROUTINE X 2)     Status: Normal (Preliminary result)   Collection Time   09/14/12  6:30 PM      Component Value Range   Specimen Description BLOOD LEFT FOOT     Special Requests BOTTLES DRAWN AEROBIC ONLY 3CC     Culture  Setup Time 09/14/2012 23:48     Culture       Value:         BLOOD CULTURE RECEIVED NO GROWTH TO DATE CULTURE WILL BE HELD FOR 5 DAYS BEFORE ISSUING A FINAL NEGATIVE REPORT   Report Status PENDING     Mr Ryan [name] Wo Contrast  09/15/2012  *RADIOLOGY REPORT*  Clinical Data:  Altered mental status.  Pyelonephritis possibly with urosepsis.  MRI BRAIN WITHOUT CONTRAST MRA HEAD WITHOUT CONTRAST  Technique: Multiplanar, multiecho pulse sequences of the brain and surrounding structures were obtained according to standard protocol without intravenous contrast.  Angiographic images of the head were obtained using MRA technique without contrast.  Comparison: 09/25/2008 head CT.  No comparison MR.  MRI HEAD  Findings:  Motion degraded exam.  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  Tiny small acute infarcts involving the posterior left temporal lobe and left parietal - occipital junction.  Tiny acute infarcts left cerebellum and right cerebellar tonsil.  This distribution of infarcts raises possibility of embolic disease.  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  No intracranial hemorrhage.  Mild small vessel disease type changes.  No intracranial mass lesion detected on this unenhanced exam.  No hydrocephalus.  Opacification in the right mastoid air cells and right middle ear. No obstructing lesion is noted in the region of the posterior- superior nasopharynx causing eustachian tube dysfunction.  No findings of thrombosis of the adjacent dural sinuses.  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  Tiny small acute infarcts involving the  posterior left temporal lobe and left parietal - occipital junction.  Tiny acute infarcts left cerebellum and right cerebellar tonsil.  This distribution of infarcts raises possibility of embolic disease.  As the patient has findings of diskitis of the cervical  spine as discussed below, septic emboli not entirely excluded.  Opacification right mastoid air cells and right middle ear as noted above.  MRA HEAD  Findings: Anterior circulation without medium or large size vessel significant stenosis or occlusion.  Mild narrowing involving portions of the A1 segment of the anterior cerebral artery bilaterally and the M1 segment of the middle cerebral artery bilaterally.  Mild branch vessel irregularity middle cerebral artery and anterior cerebral artery distribution.  Codominant vertebral arteries.  No high-grade stenosis of the distal vertebral arteries.  Very mild narrowing and irregularity mid aspect of the basilar artery.  Poor delineation AICAs.  Mild irregularity and narrowing involving portions of the superior cerebellar arteries.  No aneurysmal or vascular malformation noted.  IMPRESSION: Branch vessel atherosclerotic type changes as noted above.  MRI CERVICAL SPINE WITHOUT CONTRAST  Technique:  Multiplanar and multiecho pulse sequences of the cervical spine, to include the craniocervical junction and cervicothoracic junction, were obtained without intravenous contrast.  Comparison:   None  Findings:  Motion degraded exam.  Abnormal appearance of the C6-7 vertebra and disc space highly suspicious for a diskitis/osteomyelitis with abnormal appearance of the C6-7 and C7-T1 facet joints extending to involve the left T1 pedicle suspicious for involvement by infection.  Epidural abscess suspected although evaluation limited by motion and the fact that no contrast was administered.  This contributes to spinal stenosis and mild cord flattening (most notable C6 level).  Extension of infectious process into the neural  foramen on the right at the C5-6 and C6 level and on the left at the C5-6 through C7-T1 level.  Abnormal signal interspinous region most notable C5-6 level with extending from the C2 level to the C7 level.  Findings suspicious for spread of infection.  There is also edema within paraspinal musculature throughout the posterior neck consistent with cellulitis.  Slight increased signal within the C6 and C7 spinous process suspicious for involvement by by infection.  No definitive cervical cord signal abnormality noted on this motion degraded exam.  Both vertebral arteries and carotid arteries are patent.  C2-3:  Bulge.  Facet joint degenerative changes.  Minimal uncinate hypertrophy.  C3-4:  Broad-based disc osteophyte complex.  Moderate spinal stenosis.  Mild cord flattening.  Moderate bilateral foraminal narrowing.  C4-5:  Bulge with tiny central protrusion.  Mild spinal stenosis. Minimal bilateral foraminal narrowing.  C5-6:  Bulge/broad-based protrusion.  Mild spinal stenosis.  Mild cord flattening.  Mild bilateral foraminal narrowing.  C6-7:  Diskitis as noted above.  C7-T1:  Facet joint degenerative changes.  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  Epidural abscess suspected although evaluation limited without contrast.  This contributes to spinal stenosis most prominent C6 level.  Cervical spondylotic changes most notable C3-4 level.  Please see above further detail.  Critical Value/emergent results were called by telephone at the time of interpretation on 09/15/2012 at 11:09 am to Dr. McDiarmid, who verbally acknowledged these results.   Original Report Authenticated By: Genia Del, M.D.    Mr Brain Wo Contrast  09/15/2012  *RADIOLOGY REPORT*  Clinical Data:  Altered mental status.  Pyelonephritis possibly with urosepsis.  MRI BRAIN WITHOUT CONTRAST MRA HEAD WITHOUT  CONTRAST  Technique: Multiplanar, multiecho pulse sequences of the brain and surrounding structures were obtained according to standard protocol without intravenous contrast.  Angiographic images of the head were obtained using MRA technique without contrast.  Comparison: 09/25/2008 head CT.  No comparison MR.  MRI HEAD  Findings:  Motion degraded exam.  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  Tiny small acute infarcts involving the posterior left temporal lobe and left parietal - occipital junction.  Tiny acute infarcts left cerebellum and right cerebellar tonsil.  This distribution of infarcts raises possibility of embolic disease.  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  No intracranial hemorrhage.  Mild small vessel disease type changes.  No intracranial mass lesion detected on this unenhanced exam.  No hydrocephalus.  Opacification in the right mastoid air cells and right middle ear. No obstructing lesion is noted in the region of the posterior- superior nasopharynx causing eustachian tube dysfunction.  No findings of thrombosis of the adjacent dural sinuses.  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  Tiny small acute infarcts involving the posterior left temporal lobe and left parietal - occipital junction.  Tiny acute infarcts left cerebellum and right cerebellar tonsil.  This distribution of infarcts raises possibility of embolic disease.  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   Opacification right mastoid air cells and right middle ear as noted above.  MRA HEAD  Findings: Anterior circulation without medium or large size vessel significant stenosis or occlusion.  Mild narrowing involving portions of the A1 segment of the anterior cerebral artery bilaterally and the M1 segment of the middle cerebral artery bilaterally.  Mild branch vessel irregularity middle cerebral artery and anterior cerebral artery distribution.  Codominant vertebral arteries.  No high-grade stenosis of the distal vertebral arteries.  Very mild narrowing and irregularity mid aspect of the basilar artery.  Poor delineation AICAs.  Mild irregularity and narrowing involving portions of the superior cerebellar arteries.  No aneurysmal or vascular malformation noted.  IMPRESSION: Branch vessel atherosclerotic type changes as noted above.  MRI CERVICAL SPINE WITHOUT CONTRAST  Technique:  Multiplanar and multiecho pulse sequences of the cervical spine, to include the craniocervical junction and cervicothoracic junction, were obtained without intravenous contrast.  Comparison:   None  Findings:  Motion degraded exam.  Abnormal appearance of the C6-7 vertebra and disc space highly suspicious for a diskitis/osteomyelitis with abnormal appearance of the C6-7 and C7-T1 facet joints extending to involve the left T1 pedicle suspicious for involvement by infection.  Epidural abscess suspected although evaluation limited by motion and the fact that no contrast was administered.  This contributes to spinal stenosis and mild cord flattening (most notable C6 level).  Extension of infectious process into the neural foramen on the right at the C5-6 and C6 level and on the left at the C5-6 through C7-T1 level.  Abnormal signal interspinous region most notable C5-6 level with extending from the C2 level to the C7 level.  Findings suspicious for spread of infection.  There is also edema within paraspinal musculature throughout the posterior  neck consistent with cellulitis.  Slight increased signal within the C6 and C7 spinous process suspicious for involvement by by infection.  No definitive cervical cord signal abnormality noted on this motion degraded exam.  Both vertebral arteries and carotid arteries are  patent.  C2-3:  Bulge.  Facet joint degenerative changes.  Minimal uncinate hypertrophy.  C3-4:  Broad-based disc osteophyte complex.  Moderate spinal stenosis.  Mild cord flattening.  Moderate bilateral foraminal narrowing.  C4-5:  Bulge with tiny central protrusion.  Mild spinal stenosis. Minimal bilateral foraminal narrowing.  C5-6:  Bulge/broad-based protrusion.  Mild spinal stenosis.  Mild cord flattening.  Mild bilateral foraminal narrowing.  C6-7:  Diskitis as noted above.  C7-T1:  Facet joint degenerative changes.  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  Epidural abscess suspected although evaluation limited without contrast.  This contributes to spinal stenosis most prominent C6 level.  Cervical spondylotic changes most notable C3-4 level.  Please see above further detail.  Critical Value/emergent results were called by telephone at the time of interpretation on 09/15/2012 at 11:09 am to Dr. McDiarmid, who verbally acknowledged these results.   Original Report Authenticated By: Genia Del, M.D.    Mr Cervical Spine Wo Contrast  09/15/2012  *RADIOLOGY REPORT*  Clinical Data:  Altered mental status.  Pyelonephritis possibly with urosepsis.  MRI BRAIN WITHOUT CONTRAST MRA HEAD WITHOUT CONTRAST  Technique: Multiplanar, multiecho pulse sequences of the brain and surrounding structures were obtained according to standard protocol without intravenous contrast.  Angiographic images of the head were obtained using MRA technique without contrast.  Comparison: 09/25/2008 head CT.  No comparison MR.  MRI HEAD   Findings:  Motion degraded exam.  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  Tiny small acute infarcts involving the posterior left temporal lobe and left parietal - occipital junction.  Tiny acute infarcts left cerebellum and right cerebellar tonsil.  This distribution of infarcts raises possibility of embolic disease.  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  No intracranial hemorrhage.  Mild small vessel disease type changes.  No intracranial mass lesion detected on this unenhanced exam.  No hydrocephalus.  Opacification in the right mastoid air cells and right middle ear. No obstructing lesion is noted in the region of the posterior- superior nasopharynx causing eustachian tube dysfunction.  No findings of thrombosis of the adjacent dural sinuses.  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  Tiny small acute infarcts involving the posterior left temporal lobe and left parietal - occipital junction.  Tiny acute infarcts left cerebellum and right cerebellar tonsil.  This distribution of infarcts raises possibility of embolic disease.  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  Opacification right mastoid air cells and right middle ear as noted above.  MRA HEAD  Findings: Anterior circulation without medium or large size vessel significant stenosis or occlusion.  Mild narrowing involving portions of the A1 segment of the anterior cerebral artery bilaterally and the M1 segment of the middle cerebral  artery bilaterally.  Mild branch vessel irregularity middle cerebral artery and anterior cerebral artery distribution.  Codominant vertebral arteries.  No high-grade stenosis of the distal vertebral arteries.  Very mild narrowing and irregularity mid aspect of the basilar artery.  Poor delineation  AICAs.  Mild irregularity and narrowing involving portions of the superior cerebellar arteries.  No aneurysmal or vascular malformation noted.  IMPRESSION: Branch vessel atherosclerotic type changes as noted above.  MRI CERVICAL SPINE WITHOUT CONTRAST  Technique:  Multiplanar and multiecho pulse sequences of the cervical spine, to include the craniocervical junction and cervicothoracic junction, were obtained without intravenous contrast.  Comparison:   None  Findings:  Motion degraded exam.  Abnormal appearance of the C6-7 vertebra and disc space highly suspicious for a diskitis/osteomyelitis with abnormal appearance of the C6-7 and C7-T1 facet joints extending to involve the left T1 pedicle suspicious for involvement by infection.  Epidural abscess suspected although evaluation limited by motion and the fact that no contrast was administered.  This contributes to spinal stenosis and mild cord flattening (most notable C6 level).  Extension of infectious process into the neural foramen on the right at the C5-6 and C6 level and on the left at the C5-6 through C7-T1 level.  Abnormal signal interspinous region most notable C5-6 level with extending from the C2 level to the C7 level.  Findings suspicious for spread of infection.  There is also edema within paraspinal musculature throughout the posterior neck consistent with cellulitis.  Slight increased signal within the C6 and C7 spinous process suspicious for involvement by by infection.  No definitive cervical cord signal abnormality noted on this motion degraded exam.  Both vertebral arteries and carotid arteries are patent.  C2-3:  Bulge.  Facet joint degenerative  changes.  Minimal uncinate hypertrophy.  C3-4:  Broad-based disc osteophyte complex.  Moderate spinal stenosis.  Mild cord flattening.  Moderate bilateral foraminal narrowing.  C4-5:  Bulge with tiny central protrusion.  Mild spinal stenosis. Minimal bilateral foraminal narrowing.  C5-6:  Bulge/broad-based protrusion.  Mild spinal stenosis.  Mild cord flattening.  Mild bilateral foraminal narrowing.  C6-7:  Diskitis as noted above.  C7-T1:  Facet joint degenerative changes.  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  Epidural abscess suspected although evaluation limited without contrast.  This contributes to spinal stenosis most prominent C6 level.  Cervical spondylotic changes most notable C3-4 level.  Please see above further detail.  Critical Value/emergent results were called by telephone at the time of interpretation on 09/15/2012 at 11:09 am to Dr. McDiarmid, who verbally acknowledged these results.   Original Report Authenticated By: Genia Del, M.D.    Dg Foot Complete Right  09/14/2012  *RADIOLOGY REPORT*  Clinical Data: Pain  RIGHT FOOT COMPLETE - 3+ VIEW  Comparison: Yesterday  Findings: A BB has been utilized to mark the scan location of pain. There is corresponds to the area of skin just dorsal to the the wire is transfixed in the distal first metatarsal.  Stable hardware compared with yesterday.  No new fracture.  Degenerative changes at the first metatarsophalangeal joint.  Central erosions involving the first metatarsal head are stable.  No aggressive periosteal reaction.  IMPRESSION: A skin BB marks the location of pain overlying the hardware in the distal first metatarsal.  Stable degenerative and postoperative changes.   Original Report Authenticated By: Marybelle Killings, M.D.         Assessment/Plan  Ryan [name] is a 66 y.o. year old male presenting  with likely AMS secondary to Pyelo and possible urosepsis, and possible septic joint vs cellulitis   1. Gram + Cocci UTI,  MSSA Bacteremia- Pt continues to be tachycardic, tachypnic,  and febrile  1) 2/2 BCx from 12/23 show MSSA pan sensitive, UCx > 100,000 EColi pansensitive.  Repeat BCx from 12/26 pending.  Consult to ID for Staph bacteremia and greatly appreciate input.  Recommend switching from Vanc/Zosyn to Ancef and will need 6 weeks of this.  Also further w/u including TTE and TEE for possible vegetative source along with MRI Head/Neck. See below  2) Pt also presented with R wrist inflammation and erythema.  In ED, aspirate by Ortho of right wrist aspirate showing gram Positive Cocci in pairs.  Film of wrist did not show any acute osseous changes.  Consult to Hand Surgeon placed and greatly appreciate input.  Taken to the OR yesterday for debridement of the are and is currently NGTD.  Continues to follow  3) Also, pt has open ulcer R 1st MTP that could have been seeding his bacteremia.  R foot film does not show acute osseous changes.   Ortho re-evaluated and since pt had previous hardware placed in toe, was taken back to OR on 12/26 to remove hardware and debride the area.  Appreciate consult and management     4) Continue to monitor CBC daily and ESR.  CBC showing WBC steady around 12.8 and Hgb around 9.4.  ESR elevated to 77 on 12/25 and 81 on 12/26.   2. 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    3) MRI/MRA Brain along with Neck showed several small areas of acute infarction involving the right hemisphere multiple areas with branch vessel atherosclerosis,  C6-7 diskitis with spread of infection to the adjacent facet joints, spinous process and interspinous ligaments/posterior paraspinal musculature as discussed above. Epidural abscess suspected although evaluation limited without contrast.   4) Spoke to Neurosurgery for further management.  Greatly appreciate input.   3. PAD/Ischemic limb: concern on admission that LE were ischemic. Workup performed by vascular in ED which was positive for PAD on ABI testing. No reported claudication by pt.   1)Will D/C Heparin due to possible septic embolus causing infarcts in his brain and do not want to cause hemorrhage.   2) Will be going for arteriogram when infection clears or can be done in outpatient.  Appreciate Vascular recommendations   4.  Acute Kidney Injury. Dehydration vs infection induced vs CKD. CR 1.4 on admission. Unsure of baseline. Producing urine, continue to monitor   1) BMET daily, continue to follow   5. Elevated Transaminases - Pt AST:ALT 2:1 consistent with alcoholic picture. EtOH on admission < 11  1) on CIWA  7. Newly Dx DM II - A1C 6.5 on check  1)Consider sensitive SSI if starts to develop hyperglycemia   7. FEN/GI:   1) Passed swallow study at bedside.  Carb modified diet when coming back from OR.   2) 1/2 NS @ 125 cc/hr.    3) Strict I/O   8. Prophylaxis:   1) SCD until neurosurgery evaluation   9. Disposition: pending further workup   Ryan [name] R. Awanda Mink, DO of [facility] 09/15/2012, 11:58 AM

## 2012-09-16 ENCOUNTER — Inpatient Hospital Stay (HOSPITAL_COMMUNITY): Payer: Medicare PPO

## 2012-09-16 DIAGNOSIS — A419 Sepsis, unspecified organism: Secondary | ICD-10-CM

## 2012-09-16 DIAGNOSIS — B958 Unspecified staphylococcus as the cause of diseases classified elsewhere: Secondary | ICD-10-CM | POA: Diagnosis present

## 2012-09-16 DIAGNOSIS — R7881 Bacteremia: Secondary | ICD-10-CM | POA: Diagnosis present

## 2012-09-16 DIAGNOSIS — I480 Paroxysmal atrial fibrillation: Secondary | ICD-10-CM

## 2012-09-16 DIAGNOSIS — I5022 Chronic systolic (congestive) heart failure: Secondary | ICD-10-CM

## 2012-09-16 DIAGNOSIS — I309 Acute pericarditis, unspecified: Secondary | ICD-10-CM

## 2012-09-16 DIAGNOSIS — I4891 Unspecified atrial fibrillation: Secondary | ICD-10-CM

## 2012-09-16 DIAGNOSIS — I319 Disease of pericardium, unspecified: Secondary | ICD-10-CM

## 2012-09-16 DIAGNOSIS — G062 Extradural and subdural abscess, unspecified: Secondary | ICD-10-CM

## 2012-09-16 DIAGNOSIS — R079 Chest pain, unspecified: Secondary | ICD-10-CM

## 2012-09-16 DIAGNOSIS — A4101 Sepsis due to Methicillin susceptible Staphylococcus aureus: Principal | ICD-10-CM

## 2012-09-16 HISTORY — DX: Acute pericarditis, unspecified: I30.9

## 2012-09-16 LAB — BASIC METABOLIC PANEL
BUN: 20 mg/dL (ref 6–23)
CO2: 20 mEq/L (ref 19–32)
Calcium: 8.5 mg/dL (ref 8.4–10.5)
Chloride: 99 mEq/L (ref 96–112)
Creatinine, Ser: 1.1 mg/dL (ref 0.50–1.35)
GFR calc Af Amer: 78 mL/min — ABNORMAL LOW (ref >90)
GFR calc non Af Amer: 67 mL/min — ABNORMAL LOW (ref >90)
Glucose, Bld: 100 mg/dL — ABNORMAL HIGH (ref 70–99)
Potassium: 4 mEq/L (ref 3.5–5.1)
Sodium: 131 mEq/L — ABNORMAL LOW (ref 135–145)

## 2012-09-16 LAB — BODY FLUID CULTURE: Special Requests: NORMAL

## 2012-09-16 LAB — LIPID PANEL
Cholesterol: 121 mg/dL (ref 0–200)
HDL: 6 mg/dL — ABNORMAL LOW (ref >39)
LDL Cholesterol: 80 mg/dL (ref 0–99)
Total CHOL/HDL Ratio: 20.2 RATIO
Triglycerides: 174 mg/dL — ABNORMAL HIGH (ref <150)
VLDL: 35 mg/dL (ref 0–40)

## 2012-09-16 LAB — CBC
HCT: 29.5 % — ABNORMAL LOW (ref 39.0–52.0)
Hemoglobin: 10.1 g/dL — ABNORMAL LOW (ref 13.0–17.0)
MCH: 22.3 pg — ABNORMAL LOW (ref 26.0–34.0)
MCHC: 34.2 g/dL (ref 30.0–36.0)
MCV: 65.3 fL — ABNORMAL LOW (ref 78.0–100.0)
Platelets: 210 10*3/uL (ref 150–400)
RBC: 4.52 MIL/uL (ref 4.22–5.81)
RDW: 14.9 % (ref 11.5–15.5)
WBC: 13.3 10*3/uL — ABNORMAL HIGH (ref 4.0–10.5)

## 2012-09-16 LAB — HEPARIN LEVEL (UNFRACTIONATED): Heparin Unfractionated: <0.1 IU/mL — ABNORMAL LOW (ref 0.30–0.70)

## 2012-09-16 LAB — PRO B NATRIURETIC PEPTIDE: Pro B Natriuretic peptide (BNP): 3458 pg/mL — ABNORMAL HIGH (ref 0–125)

## 2012-09-16 LAB — WOUND CULTURE
Gram Stain: NONE SEEN
Special Requests: NORMAL

## 2012-09-16 LAB — TROPONIN I
Troponin I: 2.04 ng/mL (ref <0.30)
Troponin I: 0.98 ng/mL (ref <0.30)
Troponin I: 0.86 ng/mL (ref <0.30)
Troponin I: 0.83 ng/mL (ref <0.30)

## 2012-09-16 LAB — SEDIMENTATION RATE: Sed Rate: 67 mm/hr — ABNORMAL HIGH (ref 0–16)

## 2012-09-16 MED ORDER — MORPHINE SULFATE 2 MG/ML IJ SOLN
2.0000 mg | Freq: Once | INTRAMUSCULAR | Status: AC
  Administered 2012-09-16: 2 mg via INTRAVENOUS

## 2012-09-16 MED ORDER — TECHNETIUM TO 99M ALBUMIN AGGREGATED
6.0000 | Freq: Once | INTRAVENOUS | Status: AC | PRN
  Administered 2012-09-16: 6 via INTRAVENOUS

## 2012-09-16 MED ORDER — IBUPROFEN 800 MG PO TABS
800.0000 mg | ORAL_TABLET | Freq: Three times a day (TID) | ORAL | Status: AC
  Administered 2012-09-16 – 2012-09-17 (×6): 800 mg via ORAL
  Filled 2012-09-16 (×6): qty 1

## 2012-09-16 MED ORDER — FLUCONAZOLE 150 MG PO TABS
150.0000 mg | ORAL_TABLET | Freq: Once | ORAL | Status: AC
  Administered 2012-09-16: 150 mg via ORAL
  Filled 2012-09-16: qty 1

## 2012-09-16 MED ORDER — AMIODARONE HCL 200 MG PO TABS
200.0000 mg | ORAL_TABLET | Freq: Two times a day (BID) | ORAL | Status: DC
  Administered 2012-09-16 – 2012-10-02 (×31): 200 mg via ORAL
  Filled 2012-09-16 (×37): qty 1

## 2012-09-16 MED ORDER — METOPROLOL TARTRATE 25 MG PO TABS
25.0000 mg | ORAL_TABLET | Freq: Four times a day (QID) | ORAL | Status: DC
  Administered 2012-09-16 – 2012-09-26 (×39): 25 mg via ORAL
  Filled 2012-09-16 (×49): qty 1

## 2012-09-16 MED ORDER — TECHNETIUM TC 99M DIETHYLENETRIAME-PENTAACETIC ACID
41.2000 | Freq: Once | INTRAVENOUS | Status: AC | PRN
  Administered 2012-09-16: 41.2 via INTRAVENOUS

## 2012-09-16 MED ORDER — ASPIRIN 325 MG PO TABS
325.0000 mg | ORAL_TABLET | ORAL | Status: AC
  Administered 2012-09-16: 325 mg via ORAL
  Filled 2012-09-16: qty 1

## 2012-09-16 MED ORDER — NITROGLYCERIN 0.4 MG SL SUBL
SUBLINGUAL_TABLET | SUBLINGUAL | Status: AC
  Administered 2012-09-16: 0.4 mg
  Filled 2012-09-16: qty 25

## 2012-09-16 MED ORDER — METOPROLOL TARTRATE 1 MG/ML IV SOLN
INTRAVENOUS | Status: AC
  Filled 2012-09-16: qty 5

## 2012-09-16 MED ORDER — COLCHICINE 0.6 MG PO TABS
0.6000 mg | ORAL_TABLET | Freq: Two times a day (BID) | ORAL | Status: DC
  Administered 2012-09-16 – 2012-10-06 (×39): 0.6 mg via ORAL
  Filled 2012-09-16 (×44): qty 1

## 2012-09-16 NOTE — Progress Notes (Signed)
Brief Progress Note: FPTS Patient with elevated troponin and ST elevation on EKG likely from pericarditis. Cardiologist, Dr. Raelyn Number was made aware by RN Royden Purl of elevated troponin. Plan to repeat troponin and hold on anticoagulation given risk of bleeding with pericarditis. 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 (Stroke article: http://stroke.ahajournals.org/content/42/02/1798?related-urls=yes&legid=strokeaha;42/02/[email or web address]).  PE as cause of his chest pain, oxygen requirement and tachycardia is still very much part of the differential as well. Planned on obtaining CTA but unable to obtain IV access with 20 gauge, necessary for CTA. Would like VQ scan, but this will not be obtained until 7am. Will hold on anticoagulation for now, as it is not sure that patient has a PE at this time. Will continue to monitor patient very closely.    Liam Graham, PGY-2 [facility] Resident [phone]

## 2012-09-16 NOTE — Progress Notes (Addendum)
CRITICAL VALUE ALERT  Critical value received:  Troponin 2.04  Date of notification:  09/16/2012  Time of notification:  0328  Critical value read back:yes  Nurse who received alert:  K. Royden Purl  MD notified (1st page):  Dr. Raoul Pitch  Time of first page:  0330  MD notified (2nd page): Dr. Raelyn Number   Time of second page: 0400  Responding MD:  Dr. Raoul Pitch and Dr. Raelyn Number  Time MD responded:  [phone]

## 2012-09-16 NOTE — Progress Notes (Signed)
Hydrotherapy Evaluation   Start time: [id number] End time: [id number]  09/16/12 1216  Subjective Assessment  Subjective "Is it looking OK?"  Patient and Family Stated Goals heal wound  Date of Onset 09/15/12  Prior Treatments I&D  Evaluation and Treatment  Evaluation and Treatment Procedures Explained to Patient/Family Yes  Evaluation and Treatment Procedures agreed to  Wound 09/16/12 Other (Comment) Wrist Right;Other (Comment) Right wrist  Date First Assessed/Time First Assessed: 09/16/12 1005   Wound Type: Other (Comment)  Location: Wrist  Location Orientation: Right;Other (Comment)  Wound Description (Comments): Right wrist  Present on Admission: No  Site / Wound Assessment Red;Clean;Other (Comment) (exposed tendons, fascia)  % Wound base Red or Granulating 40%  % Wound base Other (Comment) 60% (clean, white tendons/fascia)  Peri-wound Assessment Intact  Wound Length (cm) 6 cm  Wound Width (cm) 1.4 cm  Wound Depth (cm) 0.4 cm  Undermining (cm) @1 :00=2.6cm; @3 :00=1.0; @9 :00=1.1  Margins Unattacted edges (unapproximated)  Closure Sutures (2 black sutures (one proximal end of wound, one at distal))  Drainage Amount Minimal  Drainage Description Serosanguineous;No odor  Non-staged Wound Description Full thickness  Treatment Hydrotherapy (Pulse lavage);Packing (Plain strip)  Dressing Type Gauze (Comment);Compression wrap;Moist to moist;Other (Comment);Impregnated gauze (bismuth) (1/4" plain packing (2 pieces, total of 10 in); xeroform )  Dressing Changed Changed  Dressing Status Clean;Dry;Intact;Other (Comment) (includes splint, kerlix, ace wrap, mission sling)  Hydrotherapy  Pulsed Lavage with Suction (psi) 4 psi  Pulsed Lavage with Suction - Normal Saline Used 1000 mL  Pulsed Lavage Tip Tip with splash shield  Pulsed lavage therapy - wound location Rt dorsal wrist  Wound Therapy - Assess/Plan/Recommendations  Wound Therapy - Clinical Statement 66 yo adm with Rt wrist infection and  underwent I&D 12/27. Wound bed is free of necrotic tissue and drainage was NOT purulent. Hydrotherapy ordered for every other day, and if on next visit wound continues to be free of necrotic tissue, recommend d/c hydrotherapy.  Wound Therapy - Functional Problem List Unable to use Rt hand due to splint, exposed extensor tendons (await clarification from MD)  Factors Delaying/Impairing Wound Healing Infection - systemic/local;Immobility  Hydrotherapy Plan Debridement;Dressing change;Patient/family education;Pulsatile lavage with suction  Wound Therapy - Frequency 3X / week  Wound Therapy - Follow Up Recommendations Home health RN;Other (comment) (vs at MD office)    Pre-medicated for pain (po and IV meds): pain 10/10 with packing removal, RN notified   09/16/2012 Barry Brunner, PT Pager: [phone]

## 2012-09-16 NOTE — Progress Notes (Signed)
INFECTIOUS DISEASE PROGRESS NOTE  ID: Ryan [name] is a 66 y.o. male with  Principal Problem:  *Sepsis with metabolic encephalopathy Active Problems:  Fever  Peripheral vascular disease  UTI (urinary tract infection)  Septic arthritis of wrist, right  Acute ischemic stroke  Subjective: C/o not feeling well, SOB overnight.  Has had eventful last 24h- new afib, dx with pericarditis, cervical infection   Abtx:  Anti-infectives     Start     Dose/Rate Route Frequency Ordered Stop   09/16/12 0800   fluconazole (DIFLUCAN) tablet 150 mg        150 mg Oral  Once 09/16/12 0619     09/15/12 1400   ceFAZolin (ANCEF) IVPB 2 g/50 mL premix        2 g 100 mL/hr over 30 Minutes Intravenous 3 times per day 09/15/12 1110     09/13/12 1200   vancomycin (VANCOCIN) 750 mg in sodium chloride 0.9 % 150 mL IVPB  Status:  Discontinued        750 mg 150 mL/hr over 60 Minutes Intravenous Every 12 hours 09/13/12 0409 09/15/12 1110   09/13/12 0800   piperacillin-tazobactam (ZOSYN) IVPB 3.375 g  Status:  Discontinued        3.375 g 12.5 mL/hr over 240 Minutes Intravenous Every 8 hours 09/13/12 0409 09/15/12 1110   09/12/12 2300   vancomycin (VANCOCIN) IVPB 1000 mg/200 mL premix        1,000 mg 200 mL/hr over 60 Minutes Intravenous  Once 09/12/12 2253 09/13/12 0128   09/12/12 2300  piperacillin-tazobactam (ZOSYN) IVPB 3.375 g       3.375 g 12.5 mL/hr over 240 Minutes Intravenous  Once 09/12/12 2253 09/13/12 0023          Medications:  Scheduled:   . aspirin  81 mg Oral Daily  . atorvastatin  40 mg Oral q1800  .  ceFAZolin (ANCEF) IV  2 g Intravenous Q8H  . colchicine  0.6 mg Oral BID  . fluconazole  150 mg Oral Once  . folic acid  1 mg Oral Daily  . ibuprofen  800 mg Oral TID WC  . metoprolol tartrate  25 mg Oral Q6H  . multivitamin with minerals  1 tablet Oral Daily  . sodium chloride  3 mL Intravenous Q12H  . thiamine  100 mg Oral Daily    Objective: Vital signs in last 24  hours: Temp:  [97.9 F (36.6 C)-101 F (38.3 C)] 98.2 F (36.8 C) (12/28 0900) Pulse Rate:  [102-131] 102  (12/28 0404) Resp:  [16-23] 16  (12/28 0300) BP: (81-117)/(51-72) 116/67 mmHg (12/28 0404) SpO2:  [89 %-96 %] 95 % (12/28 0300)   General appearance: alert, cooperative and mild distress Resp: clear to auscultation bilaterally Cardio: regular rate and rhythm GI: normal findings: bowel sounds normal and soft, non-tender Extremities: r wrist and ankel wrappped  Lab Results  Basename 09/16/12 0520 09/15/12 1150  WBC 13.3* 14.4*  HGB 10.1* 9.5*  HCT 29.5* 27.3*  NA 131* 132*  K 4.0 3.7  CL 99 100  CO2 20 18*  BUN 20 19  CREATININE 1.10 0.98  GLU -- --   Liver Panel No results found for this basename: PROT:2,ALBUMIN:2,AST:2,ALT:2,ALKPHOS:2,BILITOT:2,BILIDIR:2,IBILI:2 in the last 72 hours Sedimentation Rate  Basename 09/16/12 0520  ESRSEDRATE 67*   C-Reactive Protein No results found for this basename: CRP:2 in the last 72 hours  Microbiology: Recent Results (from the past 240 hour(s))  URINE CULTURE  Status: Normal   Collection Time   09/12/12  8:55 PM      Component Value Range Status Comment   Specimen Description URINE, CATHETERIZED   Final    Special Requests CX ADDED AT 2127 ON [id number]   Final    Culture  Setup Time 09/12/2012 22:38   Final    Colony Count >=100,000 COLONIES/ML   Final    Culture ESCHERICHIA COLI   Final    Report Status 09/14/2012 FINAL   Final    Organism ID, Bacteria ESCHERICHIA COLI   Final   GRAM STAIN     Status: Normal   Collection Time   09/12/12  9:36 PM      Component Value Range Status Comment   Specimen Description SYNOVIAL FLUID RIGHT WRIST   Final    Special Requests Normal   Final    Gram Stain     Final    Value: ABUNDANT WBC PRESENT, PREDOMINANTLY PMN     FEW GRAM POSITIVE COCCI IN PAIRS IN CLUSTERS     Gram Stain Report Called to,Read Back By and Verified With: RN T. MITCHELL 09/12/12 [place]   Report  Status 09/12/2012 FINAL   Final   BODY FLUID CULTURE     Status: Normal   Collection Time   09/12/12  9:36 PM      Component Value Range Status Comment   Specimen Description SYNOVIAL FLUID RIGHT WRIST   Final    Special Requests Normal   Final    Gram Stain     Final    Value: ABUNDANT WBC PRESENT, PREDOMINANTLY PMN     FEW GRAM POSITIVE COCCI IN PAIRS     IN CLUSTERS Performed at [facility] Gram Stain Report Called to,Read Back By and Verified With: Gram Stain Report Called to,Read Back By and Verified With: RN T.MITCHELL 09/12/12 [address] [facility]   Culture     Final    Value: STAPHYLOCOCCUS AUREUS     Note: RIFAMPIN AND GENTAMICIN SHOULD NOT BE USED AS SINGLE DRUGS FOR TREATMENT OF STAPH INFECTIONS.   Report Status 09/16/2012 FINAL   Final    Organism ID, Bacteria STAPHYLOCOCCUS AUREUS   Final   CULTURE, BLOOD (ROUTINE X 2)     Status: Normal   Collection Time   09/12/12 10:45 PM      Component Value Range Status Comment   Specimen Description BLOOD ARM LEFT   Final    Special Requests BOTTLES DRAWN AEROBIC AND ANAEROBIC 10CC   Final    Culture  Setup Time 09/13/2012 04:15   Final    Culture     Final    Value: STAPHYLOCOCCUS AUREUS     Note: RIFAMPIN AND GENTAMICIN SHOULD NOT BE USED AS SINGLE DRUGS FOR TREATMENT OF STAPH INFECTIONS.     Note: Gram Stain Report Called to,Read Back By and Verified With: WHITNEY DAVIS@1518  ON [id number] BY [facility]   Report Status 09/15/2012 FINAL   Final    Organism ID, Bacteria STAPHYLOCOCCUS AUREUS   Final   CULTURE, BLOOD (ROUTINE X 2)     Status: Normal   Collection Time   09/12/12 10:45 PM      Component Value Range Status Comment   Specimen Description BLOOD ARM RIGHT   Final    Special Requests BOTTLES DRAWN AEROBIC ONLY 10CC   Final    Culture  Setup Time 09/13/2012 04:15   Final    Culture  Final    Value: STAPHYLOCOCCUS AUREUS     Note: SUSCEPTIBILITIES PERFORMED ON PREVIOUS CULTURE WITHIN THE LAST 5 DAYS.     Note: Gram Stain  Report Called to,Read Back By and Verified With: WHITNEY DAVIS@1518  ON [id number] BY [facility]   Report Status 09/15/2012 FINAL   Final   MRSA PCR SCREENING     Status: Normal   Collection Time   09/13/12  3:44 AM      Component Value Range Status Comment   MRSA by PCR NEGATIVE  NEGATIVE Final   WOUND CULTURE     Status: Normal   Collection Time   09/13/12  9:12 AM      Component Value Range Status Comment   Specimen Description WOUND RIGHT FOOT   Final    Special Requests Normal   Final    Gram Stain     Final    Value: NO WBC SEEN     FEW SQUAMOUS EPITHELIAL CELLS PRESENT     RARE GRAM POSITIVE COCCI IN PAIRS   Culture     Final    Value: MODERATE STAPHYLOCOCCUS AUREUS     Note: RIFAMPIN AND GENTAMICIN SHOULD NOT BE USED AS SINGLE DRUGS FOR TREATMENT OF STAPH INFECTIONS.   Report Status 09/16/2012 FINAL   Final    Organism ID, Bacteria STAPHYLOCOCCUS AUREUS   Final   WOUND CULTURE     Status: Normal (Preliminary result)   Collection Time   09/14/12  2:04 PM      Component Value Range Status Comment   Specimen Description WOUND WRIST RIGHT   Final    Special Requests PATIENT ON FOLLOWING ZOSYN,VANCOMYCIN   Final    Gram Stain     Final    Value: FEW WBC PRESENT, PREDOMINANTLY PMN     NO SQUAMOUS EPITHELIAL CELLS SEEN     NO ORGANISMS SEEN   Culture     Final    Value: MODERATE STAPHYLOCOCCUS AUREUS     Note: RIFAMPIN AND GENTAMICIN SHOULD NOT BE USED AS SINGLE DRUGS FOR TREATMENT OF STAPH INFECTIONS.   Report Status PENDING   Incomplete   ANAEROBIC CULTURE     Status: Normal (Preliminary result)   Collection Time   09/14/12  2:04 PM      Component Value Range Status Comment   Specimen Description WOUND WRIST RIGHT   Final    Special Requests PATIENT ON FOLLOWING ZOSYN,VANCOMYCIN   Final    Gram Stain PENDING   Incomplete    Culture     Final    Value: NO ANAEROBES ISOLATED; CULTURE IN PROGRESS FOR 5 DAYS   Report Status PENDING   Incomplete   CULTURE, BLOOD (ROUTINE X 2)      Status: Normal (Preliminary result)   Collection Time   09/14/12  6:30 PM      Component Value Range Status Comment   Specimen Description BLOOD LEFT FOOT   Final    Special Requests BOTTLES DRAWN AEROBIC ONLY 3CC   Final    Culture  Setup Time 09/14/2012 23:48   Final    Culture     Final    Value:        BLOOD CULTURE RECEIVED NO GROWTH TO DATE CULTURE WILL BE HELD FOR 5 DAYS BEFORE ISSUING A FINAL NEGATIVE REPORT   Report Status PENDING   Incomplete     Studies/Results: Dg Chest 1 View  09/16/2012  *RADIOLOGY REPORT*  Clinical Data: Cough, chest pain.  CHEST -  1 VIEW  Comparison: 09/12/2012  Findings: There is cardiomegaly with vascular congestion.  Right base predominately linear opacities, likely atelectasis.  This is increased since prior study.  Increasing left base opacity which could represent atelectasis or infiltrate.  No definite effusion. No acute bony abnormality.  IMPRESSION: Increasing bibasilar opacities bilaterally.  Cardiomegaly, vascular congestion.   Original Report Authenticated By: Rolm Baptise, M.D.    Mr [facility] Wo Contrast  09/15/2012  *RADIOLOGY REPORT*  Clinical Data:  Altered mental status.  Pyelonephritis possibly with urosepsis.  MRI BRAIN WITHOUT CONTRAST MRA HEAD WITHOUT CONTRAST  Technique: Multiplanar, multiecho pulse sequences of the brain and surrounding structures were obtained according to standard protocol without intravenous contrast.  Angiographic images of the head were obtained using MRA technique without contrast.  Comparison: 09/25/2008 head CT.  No comparison MR.  MRI HEAD  Findings:  Motion degraded exam.  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  Tiny small acute infarcts involving the posterior left temporal lobe and left parietal - occipital junction.  Tiny acute infarcts  left cerebellum and right cerebellar tonsil.  This distribution of infarcts raises possibility of embolic disease.  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  No intracranial hemorrhage.  Mild small vessel disease type changes.  No intracranial mass lesion detected on this unenhanced exam.  No hydrocephalus.  Opacification in the right mastoid air cells and right middle ear. No obstructing lesion is noted in the region of the posterior- superior nasopharynx causing eustachian tube dysfunction.  No findings of thrombosis of the adjacent dural sinuses.  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  Tiny small acute infarcts involving the posterior left temporal lobe and left parietal - occipital junction.  Tiny acute infarcts left cerebellum and right cerebellar tonsil.  This distribution of infarcts raises possibility of embolic disease.  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  Opacification right mastoid air cells and right middle ear as noted above.  MRA HEAD  Findings: Anterior circulation without medium or large size vessel significant stenosis or occlusion.  Mild narrowing involving portions of the A1 segment of the anterior cerebral artery bilaterally and the M1 segment of the middle cerebral artery bilaterally.  Mild branch vessel irregularity middle cerebral artery and anterior cerebral artery distribution.  Codominant vertebral arteries.  No high-grade stenosis of the distal vertebral arteries.  Very mild narrowing and irregularity mid aspect of the basilar artery.  Poor delineation AICAs.  Mild irregularity and narrowing involving portions of the superior cerebellar  arteries.  No aneurysmal or vascular malformation noted.  IMPRESSION: Branch vessel atherosclerotic type changes as noted above.  MRI CERVICAL SPINE WITHOUT CONTRAST  Technique:  Multiplanar and multiecho pulse sequences of the cervical spine, to include the craniocervical junction and cervicothoracic junction, were obtained without intravenous contrast.  Comparison:   None  Findings:  Motion degraded exam.  Abnormal appearance of the C6-7 vertebra and disc space highly suspicious for a diskitis/osteomyelitis with abnormal appearance of the C6-7 and C7-T1 facet joints extending to involve the left T1 pedicle suspicious for involvement  by infection.  Epidural abscess suspected although evaluation limited by motion and the fact that no contrast was administered.  This contributes to spinal stenosis and mild cord flattening (most notable C6 level).  Extension of infectious process into the neural foramen on the right at the C5-6 and C6 level and on the left at the C5-6 through C7-T1 level.  Abnormal signal interspinous region most notable C5-6 level with extending from the C2 level to the C7 level.  Findings suspicious for spread of infection.  There is also edema within paraspinal musculature throughout the posterior neck consistent with cellulitis.  Slight increased signal within the C6 and C7 spinous process suspicious for involvement by by infection.  No definitive cervical cord signal abnormality noted on this motion degraded exam.  Both vertebral arteries and carotid arteries are patent.  C2-3:  Bulge.  Facet joint degenerative changes.  Minimal uncinate hypertrophy.  C3-4:  Broad-based disc osteophyte complex.  Moderate spinal stenosis.  Mild cord flattening.  Moderate bilateral foraminal narrowing.  C4-5:  Bulge with tiny central protrusion.  Mild spinal stenosis. Minimal bilateral foraminal narrowing.  C5-6:  Bulge/broad-based protrusion.  Mild spinal stenosis.  Mild cord flattening.  Mild bilateral foraminal  narrowing.  C6-7:  Diskitis as noted above.  C7-T1:  Facet joint degenerative changes.  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  Epidural abscess suspected although evaluation limited without contrast.  This contributes to spinal stenosis most prominent C6 level.  Cervical spondylotic changes most notable C3-4 level.  Please see above further detail.  Critical Value/emergent results were called by telephone at the time of interpretation on 09/15/2012 at 11:09 am to Dr. McDiarmid, who verbally acknowledged these results.   Original Report Authenticated By: Genia Del, M.D.    Mr Brain Wo Contrast  09/15/2012  *RADIOLOGY REPORT*  Clinical Data:  Altered mental status.  Pyelonephritis possibly with urosepsis.  MRI BRAIN WITHOUT CONTRAST MRA HEAD WITHOUT CONTRAST  Technique: Multiplanar, multiecho pulse sequences of the brain and surrounding structures were obtained according to standard protocol without intravenous contrast.  Angiographic images of the head were obtained using MRA technique without contrast.  Comparison: 09/25/2008 head CT.  No comparison MR.  MRI HEAD  Findings:  Motion degraded exam.  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  Tiny small acute infarcts involving the posterior left temporal lobe and left parietal - occipital junction.  Tiny acute infarcts left cerebellum and right cerebellar tonsil.  This distribution of infarcts raises possibility of embolic disease.  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  No intracranial hemorrhage.  Mild small vessel disease type changes.  No intracranial mass lesion  detected on this unenhanced exam.  No hydrocephalus.  Opacification in the right mastoid air cells and right middle ear. No obstructing lesion is noted in the region of the posterior- superior nasopharynx causing eustachian tube dysfunction.  No findings of thrombosis of the adjacent dural sinuses.  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  Tiny small acute infarcts involving the posterior left temporal lobe and left parietal -  occipital junction.  Tiny acute infarcts left cerebellum and right cerebellar tonsil.  This distribution of infarcts raises possibility of embolic disease.  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  Opacification right mastoid air cells and right middle ear as noted above.  MRA HEAD  Findings: Anterior circulation without medium or large size vessel significant stenosis or occlusion.  Mild narrowing involving portions of the A1 segment of the anterior cerebral artery bilaterally and the M1 segment of the middle cerebral artery bilaterally.  Mild branch vessel irregularity middle cerebral artery and anterior cerebral artery distribution.  Codominant vertebral arteries.  No high-grade stenosis of the distal vertebral arteries.  Very mild narrowing and irregularity mid aspect of the basilar artery.  Poor delineation AICAs.  Mild irregularity and narrowing involving portions of the superior cerebellar arteries.  No aneurysmal or vascular malformation noted.  IMPRESSION: Branch vessel atherosclerotic type changes as noted above.  MRI CERVICAL SPINE WITHOUT CONTRAST  Technique:  Multiplanar and multiecho pulse sequences of the cervical spine, to include the craniocervical junction and cervicothoracic junction, were obtained without  intravenous contrast.  Comparison:   None  Findings:  Motion degraded exam.  Abnormal appearance of the C6-7 vertebra and disc space highly suspicious for a diskitis/osteomyelitis with abnormal appearance of the C6-7 and C7-T1 facet joints extending to involve the left T1 pedicle suspicious for involvement by infection.  Epidural abscess suspected although evaluation limited by motion and the fact that no contrast was administered.  This contributes to spinal stenosis and mild cord flattening (most notable C6 level).  Extension of infectious process into the neural foramen on the right at the C5-6 and C6 level and on the left at the C5-6 through C7-T1 level.  Abnormal signal interspinous region most notable C5-6 level with extending from the C2 level to the C7 level.  Findings suspicious for spread of infection.  There is also edema within paraspinal musculature throughout the posterior neck consistent with cellulitis.  Slight increased signal within the C6 and C7 spinous process suspicious for involvement by by infection.  No definitive cervical cord signal abnormality noted on this motion degraded exam.  Both vertebral arteries and carotid arteries are patent.  C2-3:  Bulge.  Facet joint degenerative changes.  Minimal uncinate hypertrophy.  C3-4:  Broad-based disc osteophyte complex.  Moderate spinal stenosis.  Mild cord flattening.  Moderate bilateral foraminal narrowing.  C4-5:  Bulge with tiny central protrusion.  Mild spinal stenosis. Minimal bilateral foraminal narrowing.  C5-6:  Bulge/broad-based protrusion.  Mild spinal stenosis.  Mild cord flattening.  Mild bilateral foraminal narrowing.  C6-7:  Diskitis as noted above.  C7-T1:  Facet joint degenerative changes.  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  Epidural abscess suspected although evaluation limited without  contrast.  This contributes to spinal stenosis most prominent C6 level.  Cervical spondylotic changes most notable C3-4 level.  Please see above further detail.  Critical Value/emergent results were called by telephone at the time of interpretation on 09/15/2012 at 11:09 am to Dr. McDiarmid, who verbally acknowledged these results.   Original Report Authenticated By: Genia Del, M.D.    Mr Cervical Spine Wo Contrast  09/15/2012  *RADIOLOGY REPORT*  Clinical Data:  Altered mental status.  Pyelonephritis possibly with urosepsis.  MRI BRAIN WITHOUT CONTRAST MRA HEAD WITHOUT CONTRAST  Technique: Multiplanar, multiecho pulse sequences  of the brain and surrounding structures were obtained according to standard protocol without intravenous contrast.  Angiographic images of the head were obtained using MRA technique without contrast.  Comparison: 09/25/2008 head CT.  No comparison MR.  MRI HEAD  Findings:  Motion degraded exam.  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  Tiny small acute infarcts involving the posterior left temporal lobe and left parietal - occipital junction.  Tiny acute infarcts left cerebellum and right cerebellar tonsil.  This distribution of infarcts raises possibility of embolic disease.  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  No intracranial hemorrhage.  Mild small vessel disease type changes.  No intracranial mass lesion detected on this unenhanced exam.  No hydrocephalus.  Opacification in the right mastoid air cells and right middle ear. No obstructing lesion is noted in the region of the posterior- superior nasopharynx causing eustachian tube dysfunction.  No findings of thrombosis of the adjacent dural sinuses.  IMPRESSION:  Several  small areas of acute infarction involving the right hemisphere including portions of the; right occipital lobe, right temporal lobe, right opercular region, posterior right frontal lobe and right parietal lobe.  Tiny small acute infarcts involving the posterior left temporal lobe and left parietal - occipital junction.  Tiny acute infarcts left cerebellum and right cerebellar tonsil.  This distribution of infarcts raises possibility of embolic disease.  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  Opacification right mastoid air cells and right middle ear as noted above.  MRA HEAD  Findings: Anterior circulation without medium or large size vessel significant stenosis or occlusion.  Mild narrowing involving portions of the A1 segment of the anterior cerebral artery bilaterally and the M1 segment of the middle cerebral artery bilaterally.  Mild branch vessel irregularity middle cerebral artery and anterior cerebral artery distribution.  Codominant vertebral arteries.  No high-grade stenosis of the distal vertebral arteries.  Very mild narrowing and irregularity mid aspect of the basilar artery.  Poor delineation AICAs.  Mild irregularity and narrowing involving portions of the superior cerebellar arteries.  No aneurysmal or vascular malformation noted.  IMPRESSION: Branch vessel atherosclerotic type changes as noted above.  MRI CERVICAL SPINE WITHOUT CONTRAST  Technique:  Multiplanar and multiecho pulse sequences of the cervical spine, to include the craniocervical junction and cervicothoracic junction, were obtained without intravenous contrast.  Comparison:   None  Findings:  Motion degraded exam.  Abnormal appearance of the C6-7 vertebra and disc space highly suspicious for a diskitis/osteomyelitis with abnormal appearance of the C6-7 and C7-T1 facet joints extending to involve the left T1 pedicle suspicious for involvement by infection.   Epidural abscess suspected although evaluation limited by motion and the fact that no contrast was administered.  This contributes to spinal stenosis and mild cord flattening (most notable C6 level).  Extension of infectious process into the neural foramen on the right at the C5-6 and C6 level and on the left at the C5-6 through C7-T1 level.  Abnormal signal interspinous region most notable C5-6 level with extending from the C2 level to the C7 level.  Findings suspicious for spread of infection.  There is also edema within paraspinal musculature throughout the posterior neck consistent with cellulitis.  Slight increased signal  within the C6 and C7 spinous process suspicious for involvement by by infection.  No definitive cervical cord signal abnormality noted on this motion degraded exam.  Both vertebral arteries and carotid arteries are patent.  C2-3:  Bulge.  Facet joint degenerative changes.  Minimal uncinate hypertrophy.  C3-4:  Broad-based disc osteophyte complex.  Moderate spinal stenosis.  Mild cord flattening.  Moderate bilateral foraminal narrowing.  C4-5:  Bulge with tiny central protrusion.  Mild spinal stenosis. Minimal bilateral foraminal narrowing.  C5-6:  Bulge/broad-based protrusion.  Mild spinal stenosis.  Mild cord flattening.  Mild bilateral foraminal narrowing.  C6-7:  Diskitis as noted above.  C7-T1:  Facet joint degenerative changes.  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  Epidural abscess suspected although evaluation limited without contrast.  This contributes to spinal stenosis most prominent C6 level.  Cervical spondylotic changes most notable C3-4 level.  Please see above further detail.  Critical Value/emergent results were called by telephone at the time of interpretation on 09/15/2012 at 11:09 am to Dr. McDiarmid, who verbally acknowledged these  results.   Original Report Authenticated By: Genia Del, M.D.    [facility]  09/16/2012  *RADIOLOGY REPORT*  Clinical Data: Chest pain, cough.  NM PULMONARY VENTILATION AND PERFUSION SCAN  Radiopharmaceutical: 6MILLI CURIE MAA TECHNETIUM TO 80M ALBUMIN AGGREGATED , 41 MCI TECHNETIUM 80M DTPA.  Comparison: Chest x-ray performed today.  Findings: Minimal patchy nonsegmental perfusion defects noted.  No significant VQ mismatch to suggest pulmonary embolus.  IMPRESSION: Low probability study for pulmonary embolus.   Original Report Authenticated By: Rolm Baptise, M.D.      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  Total days of antibiotics 4 (ancef)          Bobby Rumpf Infectious Diseases [id number] 09/16/2012, 10:36 AM   LOS: 4 days

## 2012-09-16 NOTE — Progress Notes (Signed)
Saw Ryan [name] 66 y.o. [id number]  S: Ryan [name] is s/p orthopedic surgery for hardware removal today. This evening he experienced some right calf pain above surgical area. He then began to feel diaphoretic, became tachycardic and experienced dyspnea. STAT EKG resulted in Sinus tach with ST elevation in lead II and V5.   O:  BP 81/51  Pulse 107  Temp 100.8 F (38.2 C) (Oral)  Resp 21  Ht 6' (1.829 m)  Wt 186 lb 1.1 oz (84.4 kg)  BMI 25.24 kg/m2  SpO2 89% Gen: Diaphoretic. Distressed. Increased O2 demand from 2L to 3.5 Ryan [name] CV: Tachycardic. SR. No murmur, rubs or gallops appreciated Lungs: CTAB. No wheezing or crackles. EXT: Right calf tenderness posterior. No erythema or edema.    A/P: Pt is a 66 yo man with HTN who presented with bacteremia from septic joint s/p removal of hardware. 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 Tonight pt had right calf pain followed by CP on coughing, EKG was obtained showing tachycardia, ST elevations and "acute MI" on read. Differential diagnosis include acute coronary syndrome vs. Pericarditis vs PE - ASA, O2, Morphine 2 mg and 0.4 nitro administered.  - Echo yesterday normal EF with moderate pericardial effusion. Repeat EKG: Afib with RVR - Consulted cardiology  for further management, we thank them for their recommendations and assistance.      - Per cards: EKGs, sx, pericardial effusion are all consistent with pericarditis. Consider Emboli etiology.     - treat with NSAIDs and colchicine      - cycle cardiac enzymes, check serial EKGs      - BB for rate control of AF - he received 5 mg IV metop, start 25 mg metop PO q6 hours if BPs can tolerate,       otherwise, can consider dig loading      - would need to know if ok to start heparin from standpoint of multiple embolic events to brain. Would also be concerned about giving anticoagulation during acute pericarditis with pericardial effusion this can result in hemorrhagic transformation of the effusion.  Will continue to monitor  -ST changes improved somewhat on serial EKGs and no reciprocal changes, arguing against true STEMI from plaque rupture  - CHADS vasc score is 2, so he will need anticoagulation if he stays in AF for more that 24-48 hours  - TEE is planned to look for vegetations in the near future  - could also consider DCCV if pt's HR are difficult to control  - Given recent orthopedic surgery, tachycardia, increase O2 requirement, dyspnea with calf pain PE was a concern> CTA pending.

## 2012-09-16 NOTE — Progress Notes (Signed)
Subjective:  2 Day Post-Op Procedure(s) (LRB):  IRRIGATION AND DEBRIDEMENT EXTREMITY (Right)  REMOVAL K-WIRE/PIN EXTREMITY (Right)  Patient reports pain as 1 on 0-10 scale.  No significant pain in right foot.   Objective:  Vital signs BP 116/67  Pulse 102  Temp 97.9 F (36.6 C) (Oral)  Resp 16  Ht 6' (1.829 m)  Wt 84.4 kg (186 lb 1.1 oz)  BMI 25.24 kg/m2  SpO2 95%   Right foot exam:  Wound covered in ace wrap, gauze and telfa pad, right foot wound is benign. No sign of infection. Minimal swelling. No redness, stitches in place. Bandage replaced. 2+ DPP, cap refill <2sec  Assessment/Plan:  2 Day Post-Op  IRRIGATION AND DEBRIDEMENT EXTREMITY (Right)  REMOVAL K-WIRE/PIN EXTREMITY (Right) Foot.   Plan:  Right foot dressing changed.  He may weight-bear as tolerated on his right foot once he becomes mobile.  Pt to F/U with Dr Berenice Primas 2 weeks PO in office for stitch removal ([place].) Discharge from ortho care today, f/u 2 weeks   Pricilla Holm, PA-C

## 2012-09-16 NOTE — Progress Notes (Signed)
Pt HR 130s-170s. He complains of R calf pain and he is diaphoretic. EKG completed and Dr. Raoul Pitch notified. Dr. Raoul Pitch and Dr. Raelyn Number are at bedside. New orders received to repeat EKG, administer 325 mg aspirin, 2 mg morphine IV, 0.4 mg SL nitroglycerin (1 tablet), and 5mg  metoprolol IV. Oxygen turned up to 3L.  Pt placed in sitting position and  states his pain is now a 5/10. Will continue to monitor.

## 2012-09-16 NOTE — Progress Notes (Signed)
Unable to get 20G PIV for STAT chest CT. IV team notified and they are unable to get 20 G PIV access also. Dr. Raoul Pitch notified. Orders received to postpone chest CT until later. Will continue to monitor.

## 2012-09-16 NOTE — Progress Notes (Signed)
[facility] Progress Note  09/16/2012 7:12 AM HD 4  Pt with CP and EKG with ST elevation over night, and elevated troponins.  Suspect pericarditis  Tm 101 now afebrile Filed Vitals:   09/16/12 0404  BP: 116/67  Pulse: 102  Temp:   Resp:     Physical Exam:  Lungs:  Non labored Extremities:  Right foot cooler than left foot; right foot with dressing in place and is clean and in tact.  CBC    Component Value Date/Time   WBC 13.3* 09/16/2012 0520   RBC 4.52 09/16/2012 0520   HGB 10.1* 09/16/2012 0520   HCT 29.5* 09/16/2012 0520   PLT 210 09/16/2012 0520   MCV 65.3* 09/16/2012 0520   MCH 22.3* 09/16/2012 0520   MCHC 34.2 09/16/2012 0520   RDW 14.9 09/16/2012 0520    BMET    Component Value Date/Time   NA 131* 09/16/2012 0520   K 4.0 09/16/2012 0520   CL 99 09/16/2012 0520   CO2 20 09/16/2012 0520   GLUCOSE 100* 09/16/2012 0520   BUN 20 09/16/2012 0520   CREATININE 1.10 09/16/2012 0520   CALCIUM 8.5 09/16/2012 0520   GFRNONAA 67* 09/16/2012 0520   GFRAA 78* 09/16/2012 0520    INR    Component Value Date/Time   INR 1.25 09/13/2012 0847     Intake/Output Summary (Last 24 hours) at 09/16/12 [id number] Last data filed at 09/16/12 0600  Gross per 24 hour  Intake 2235.5 ml  Output    800 ml  Net 1435.5 ml     Assessment/Plan:  66 y.o. male  With chronic vascular disease and is s/p hardware removal right foot and I&D of right wrist HD 4  -pt is POD 2 from ortho surgery -pt right foot is cooler than left-will d/w with Dr. Trula Slade if this is significantly different as this is my first time seeing the pt. pt with multiple issues at this time and therefore will delay angiogram to evaluate RLE  -pt will need angiogram once current issues are resolved.  Leontine Locket, PA-C [facility] [phone] 09/16/2012 7:12 AM

## 2012-09-16 NOTE — Progress Notes (Signed)
Family Medicine Teaching Service Attending Note  I interviewed and examined patient Ryan [name] and reviewed their tests and x-rays.  I discussed with Dr. Raoul Pitch and reviewed their note for today.  I agree with their assessment and plan.     Additionally  Feeling ok VQ - low probab Appreciate cardiology assistance Continue IV antibiotics - will need long term iv access Watch closely for cardiac, neurologic complications of staph sepsis

## 2012-09-16 NOTE — Progress Notes (Signed)
Patient ID: Ryan [name], male   DOB: Oct 13, 1945, 66 y.o.   MRN: [id number] [facility] Medicine Teaching Service PGY-1 Progress Note   Overnight Events: Patient is resting comfortable sitting up in bed with right arm and right foot elevated. He is to have a VQ scan this morning for possible PE, he was unable to get a CTA d/t lack of IV access with a 20 g needle.  Objective: Temp:  [97.9 F (36.6 C)-101 F (38.3 C)] 98.2 F (36.8 C) (12/28 0300) Pulse Rate:  [102-131] 102  (12/28 0404) Cardiac Rhythm:  [-] Sinus tachycardia (12/27 2000) Resp:  [16-29] 16  (12/28 0300) BP: (81-117)/(51-72) 116/67 mmHg (12/28 0404) SpO2:  [89 %-96 %] 95 % (12/28 0300) Weight change:   Physical Exam: Gen: Patient is resting comfortably.  CV: NSR. 3/6 SM appreciated. Lungs: CTAB. No wheezing or crackles.  EXT: Right calf tenderness posterior. No erythema or edema.  Neuro: Sleeping. Has been AOx3   CBC    Component Value Date/Time   WBC 13.3* 09/16/2012 0520   RBC 4.52 09/16/2012 0520   HGB 10.1* 09/16/2012 0520   HCT 29.5* 09/16/2012 0520   PLT 210 09/16/2012 0520   MCV 65.3* 09/16/2012 0520   MCH 22.3* 09/16/2012 0520   MCHC 34.2 09/16/2012 0520   RDW 14.9 09/16/2012 0520    Assessment and Plan: Ryan [name] is a 66 y.o. year old male presenting with likely AMS secondary to Pyelo and possible urosepsis, and possible septic joint vs cellulitis  Abnormal EKG 12/27-28: - Stemi vs pericarditis vs PE - Patient is receiving VQ scan, since CTA was not able to be obtained d/t inefficient IV access with a 20g  - Will need to reconsider anticoagulation, due to new onset afib with RVR through the evening and possible PE  Gram + Cocci UTI, MSSA Bacteremia- Pt continues to be tachycardic, tachypnic, and febrile  1) 2/2 BCx from 12/23 show MSSA pan sensitive, UCx > 100,000 EColi pansensitive. Repeat BCx from 12/26 pending. Consult to ID for Staph bacteremia and greatly appreciate input. Recommend  switching from Vanc/Zosyn to Ancef and will need 6 weeks of this. Also further w/u including TTE and TEE for possible vegetative source along with MRI Head/Neck. IV access will be an issue for 6 weeks, will need PICC line placement. 2) Pt also presented with R wrist inflammation and erythema. In ED, aspirate by Ortho of right wrist aspirate showing gram Positive Cocci in pairs. Film of wrist did not show any acute osseous changes. Consult to Hand Surgeon placed and greatly appreciate input. Taken to the OR yesterday for debridement of the are and is currently NGTD. Continues to follow  3) Also, pt has open ulcer R 1st MTP that could have been seeding his bacteremia. R foot film does not show acute osseous changes. Ortho re-evaluated and since pt had previous hardware placed in toe, was taken back to OR on 12/26 to remove hardware and debride the area. Appreciate consult and management  4) Continue to monitor CBC daily and ESR. CBC showing WBC steady around 13 and Hgb improved from  9.4 to 10.1. ESR elevated to 77 on 12/25 and 81 on 12/26.   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   MRI/MRA Brain along with Neck showed several small areas of acute infarction involving the right hemisphere multiple areas with branch vessel atherosclerosis, C6-7 diskitis with spread of infection to the adjacent facet joints, spinous process and interspinous ligaments/posterior paraspinal musculature as discussed above. Epidural abscess suspected although evaluation limited without contrast.  Spoke to Neurosurgery for further management. Greatly appreciate input.   PAD/Ischemic limb:  concern on admission that LE were ischemic. Workup performed by vascular in ED which was positive for PAD on ABI testing. No reported claudication by pt.  1)D/Cd Heparin due to possible septic embolus causing infarcts in his brain and do not want to cause hemorrhage. However, this may need to be reconsidered if positive for PE and considerin ghis new onset AFIB with RVR. 2) Will be going for arteriogram when infection clears or can be done in outpatient. Appreciate Vascular recommendations  Acute Kidney Injury. Dehydration vs infection induced vs CKD. CR 1.4 on admission. Unsure of baseline. Producing urine, continue to monitor  1) BMET daily, continue to follow  Elevated Transaminases - Pt AST:ALT 2:1 consistent with alcoholic picture. EtOH on admission < 11  1) on CIWA  Newly Dx DM II - A1C 6.5 on check  1)Consider sensitive SSI if starts to develop hyperglycemia   FEN/GI:  1) Passed swallow study at bedside. Carb modified diet when coming back from OR.  2) 1/2 NS @ 125 cc/hr.  3) Strict I/O   Prophylaxis:  1) SCD until neurosurgery evaluation   Disposition: pending further workup

## 2012-09-16 NOTE — Consult Note (Signed)
CARDIOLOGY CONSULT NOTE   Ryan [name] MRN: [id number] DOB/AGE: Oct 23, 1945 66 y.o. Admit date: 09/12/2012  Referring Physician:  Family Medicine - Dr. Raoul Pitch  Reason for consultation:  Chest pain, EKG with ST elevations  HPI:  Pt is a 66 yo man with HTN who presented with bacteremia from septic joint s/p removal of hardware.  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  Tonight pt had CP on coughing, EKG was obtained showing ST elevations and "acute MI" on read, so cardiology was consulted for further management.  On arrival to the pt's room, he is observed to be tachycardic and in new AF.  Pt reports that he still has a little bit of pain, though he was given morphine just prior to my arrival.  He reports that the pain is worse with deep breathing.  The whole episode actually started with his calf throbbing and then the chest pain started.  He denies any SOB.  When the staff helps him sit up, the pain is improved.  He has no other acute complaints.    Review of systems: A review of 10 organ systems was done and is negative except as stated above in HPI  Past Medical History  Diagnosis Date  . Hypertension    History reviewed. No pertinent past surgical history. History   Social History  . Marital Status: Married    Spouse Name: N/A    Number of Children: N/A  . Years of Education: N/A   Occupational History  . Not on file.   Social History Main Topics  . Smoking status: Never Smoker   . Smokeless tobacco: Not on file  . Alcohol Use: No  . Drug Use:   . Sexually Active:    Other Topics Concern  . Not on file   Social History Narrative  . No narrative on file    History reviewed. No pertinent family history.   No Known Allergies  Prescriptions prior to admission  Medication Sig Dispense Refill  . valsartan (DIOVAN) 80 MG tablet Take 80 mg by mouth daily.        Current facility-administered medications:0.45 % sodium chloride infusion, , Intravenous, Continuous,  Bryan R Hess, DO, Last Rate: 125 mL/hr at 09/16/12 0031;  acetaminophen (TYLENOL) suppository 650 mg, 650 mg, Rectal, Q6H PRN, Waldemar Dickens, MD, 650 mg at 09/13/12 0414;  acetaminophen (TYLENOL) tablet 650 mg, 650 mg, Oral, Q6H PRN, Waldemar Dickens, MD, 650 mg at 09/13/12 1827 aspirin chewable tablet 81 mg, 81 mg, Oral, Daily, Bryan R Hess, DO, 81 mg at 09/15/12 1446;  atorvastatin (LIPITOR) tablet 40 mg, 40 mg, Oral, q1800, Waldemar Dickens, MD, 40 mg at 09/15/12 1808;  ceFAZolin (ANCEF) IVPB 2 g/50 mL premix, 2 g, Intravenous, Q8H, Campbell Riches, MD, 2 g at [id number];  folic acid (FOLVITE) tablet 1 mg, 1 mg, Oral, Daily, Sibyl Parr, MD, 1 mg at 09/15/12 1059 HYDROcodone-acetaminophen (NORCO/VICODIN) 5-325 MG per tablet 1-2 tablet, 1-2 tablet, Oral, Q4H PRN, Waldemar Dickens, MD, 2 tablet at 09/16/12 0021;  LORazepam (ATIVAN) injection 1 mg, 1 mg, Intravenous, Q6H PRN, Sibyl Parr, MD;  LORazepam (ATIVAN) tablet 1 mg, 1 mg, Oral, Q6H PRN, Sibyl Parr, MD;  metoprolol (LOPRESSOR) 1 MG/ML injection, , , ,  metoprolol (LOPRESSOR) injection 5 mg, 5 mg, Intravenous, Q4H PRN, Waldemar Dickens, MD, 5 mg at 09/16/12 0125;  morphine 2 MG/ML injection 1 mg, 1 mg, Intravenous, Q2H PRN, Jackolyn Confer  Fredna Dow, MD, 1 mg at 09/15/12 1816;  multivitamin with minerals tablet 1 tablet, 1 tablet, Oral, Daily, Sibyl Parr, MD, 1 tablet at 09/15/12 1059;  ondansetron (ZOFRAN) injection 4 mg, 4 mg, Intravenous, Q6H PRN, Waldemar Dickens, MD ondansetron [facility]) tablet 4 mg, 4 mg, Oral, Q6H PRN, Waldemar Dickens, MD;  polyethylene glycol (MIRALAX / GLYCOLAX) packet 17 g, 17 g, Oral, Daily PRN, Waldemar Dickens, MD, 17 g at 09/14/12 0208;  sodium chloride 0.9 % injection 3 mL, 3 mL, Intravenous, Q12H, Waldemar Dickens, MD, 3 mL at 09/15/12 2128;  thiamine (VITAMIN B-1) tablet 100 mg, 100 mg, Oral, Daily, Sibyl Parr, MD, 100 mg at 09/15/12 1059  Physical Exam: Blood pressure 81/51, pulse 107, temperature 100.8 F (38.2 C),  temperature source Oral, resp. rate 21, height 6' (1.829 m), weight 84.4 kg (186 lb 1.1 oz), SpO2 89.00%.; Body mass index is 25.24 kg/(m^2). Temp:  [97.9 F (36.6 C)-101 F (38.3 C)] 100.8 F (38.2 C) (12/27 2300) Pulse Rate:  [105-131] 107  (12/28 0132) Resp:  [18-29] 21  (12/28 0132) BP: (81-117)/(51-72) 81/51 mmHg (12/28 0132) SpO2:  [89 %-96 %] 89 % (12/28 0132)   Intake/Output Summary (Last 24 hours) at 09/16/12 0142 Last data filed at 09/15/12 2300  Gross per 24 hour  Intake   97.5 ml  Output   1326 ml  Net -1228.5 ml   General: NAD Heent: MMM Neck: No JVD  CV: irreg irreg, tachy, 1/6 SEM, no pericardial rub heard  Lungs: Clear to auscultation bilaterally anteriorly with normal respiratory effort Abdomen: Soft, nontender, nondistended Extremities:  No pedal edema Skin: no rash Neurologic: grossly nonfocal  Psych: Normal mood and affect    Labs:  Basename 09/13/12 1234  CKTOTAL 2336*  CKMB --  TROPONINI --   Lab Results  Component Value Date   WBC 14.4* 09/15/2012   HGB 9.5* 09/15/2012   HCT 27.3* 09/15/2012   MCV 64.4* 09/15/2012   PLT 202 09/15/2012    Lab 09/15/12 1150 09/13/12 0856  NA 132* --  K 3.7 --  CL 100 --  CO2 18* --  BUN 19 --  CREATININE 0.98 --  CALCIUM 8.1* --  PROT -- 6.3  BILITOT -- 1.3*  ALKPHOS -- 110  ALT -- 57*  AST -- 85*  GLUCOSE 116* --   No results found for this basename: CHOL, HDL, LDLCALC, TRIG       EKG:   0042 sinus tach - STE 1 mm II, aVF and 1.5-2 mm V5-6 0044 sinus tach - similar to previous 0120 AFL - minimal STE II, aVF, V5-6 0121 AF - STE 1 mm II, aVF   Echo:  09/15/12 - Left ventricle: Pt. tachycardic at time of study. The cavity size was mildly dilated. Wall thickness was normal. The estimated ejection fraction was 65%. Wall motion was normal; there were no regional wall motion abnormalities. - Left atrium: The atrium was mildly dilated. - Right ventricle: The cavity size was mildly  dilated. Systolic function was normal. - Pericardium, extracardiac: There is a small/moderate circumferential pericardial effusion. No evidence of tamponade. - Impressions: No definite vegetations are seen. Impressions:     Radiology:  Mr [name] [id number] Contrast  09/15/2012  *RADIOLOGY REPORT*  Clinical Data:  Altered mental status.  Pyelonephritis possibly with urosepsis.  MRI BRAIN WITHOUT CONTRAST MRA HEAD WITHOUT CONTRAST  Technique: Multiplanar, multiecho pulse sequences of the brain and surrounding structures were obtained according to standard protocol  without intravenous contrast.  Angiographic images of the head were obtained using MRA technique without contrast.  Comparison: 09/25/2008 head CT.  No comparison MR.  MRI HEAD  Findings:  Motion degraded exam.  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  Tiny small acute infarcts involving the posterior left temporal lobe and left parietal - occipital junction.  Tiny acute infarcts left cerebellum and right cerebellar tonsil.  This distribution of infarcts raises possibility of embolic disease.  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  No intracranial hemorrhage.  Mild small vessel disease type changes.  No intracranial mass lesion detected on this unenhanced exam.  No hydrocephalus.  Opacification in the right mastoid air cells and right middle ear. No obstructing lesion is noted in the region of the posterior- superior nasopharynx causing eustachian tube dysfunction.  No findings of thrombosis of the adjacent dural sinuses.  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  Tiny small acute infarcts involving the posterior left temporal lobe and left parietal - occipital junction.  Tiny acute infarcts left cerebellum and right cerebellar tonsil.  This distribution of infarcts raises possibility of embolic disease.  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  Opacification right mastoid air cells and right middle ear as noted above.  MRA HEAD  Findings: Anterior circulation without medium or large size vessel significant stenosis or occlusion.  Mild narrowing involving portions of the A1 segment of the anterior cerebral artery bilaterally and the M1 segment of the middle cerebral artery bilaterally.  Mild branch vessel irregularity middle cerebral artery and anterior cerebral artery distribution.  Codominant vertebral arteries.  No high-grade stenosis of the distal vertebral arteries.  Very mild narrowing and irregularity mid aspect of the basilar artery.  Poor delineation AICAs.  Mild irregularity and narrowing involving portions of the superior cerebellar arteries.  No aneurysmal or vascular malformation noted.  IMPRESSION: Branch vessel atherosclerotic type changes as noted above.  MRI CERVICAL SPINE WITHOUT CONTRAST  Technique:  Multiplanar and multiecho pulse sequences of the cervical spine, to include the craniocervical junction and cervicothoracic junction, were obtained without intravenous contrast.  Comparison:   None  Findings:  Motion degraded exam.  Abnormal appearance of the C6-7 vertebra and disc space highly suspicious for a diskitis/osteomyelitis with abnormal appearance of the C6-7 and C7-T1 facet joints extending to involve the left T1 pedicle suspicious for involvement by infection.  Epidural abscess suspected although evaluation limited by motion and the fact that no contrast was administered.  This contributes to spinal stenosis  and mild cord flattening (most notable C6 level).  Extension of infectious process into the neural foramen on the right at the C5-6 and C6 level and on the left at the C5-6 through C7-T1 level.  Abnormal signal interspinous region most notable C5-6 level with extending from the C2 level to the C7 level.  Findings suspicious for spread of infection.  There is also edema within paraspinal musculature throughout the posterior neck consistent with cellulitis.  Slight increased signal within the C6 and C7 spinous process suspicious for involvement by by  infection.  No definitive cervical cord signal abnormality noted on this motion degraded exam.  Both vertebral arteries and carotid arteries are patent.  C2-3:  Bulge.  Facet joint degenerative changes.  Minimal uncinate hypertrophy.  C3-4:  Broad-based disc osteophyte complex.  Moderate spinal stenosis.  Mild cord flattening.  Moderate bilateral foraminal narrowing.  C4-5:  Bulge with tiny central protrusion.  Mild spinal stenosis. Minimal bilateral foraminal narrowing.  C5-6:  Bulge/broad-based protrusion.  Mild spinal stenosis.  Mild cord flattening.  Mild bilateral foraminal narrowing.  C6-7:  Diskitis as noted above.  C7-T1:  Facet joint degenerative changes.  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  Epidural abscess suspected although evaluation limited without contrast.  This contributes to spinal stenosis most prominent C6 level.  Cervical spondylotic changes most notable C3-4 level.  Please see above further detail.  Critical Value/emergent results were called by telephone at the time of interpretation on 09/15/2012 at 11:09 am to Dr. McDiarmid, who verbally acknowledged these results.   Original Report Authenticated By: Genia Del, M.D.    Mr Brain Wo Contrast  09/15/2012  *RADIOLOGY REPORT*  Clinical Data:  Altered  mental status.  Pyelonephritis possibly with urosepsis.  MRI BRAIN WITHOUT CONTRAST MRA HEAD WITHOUT CONTRAST  Technique: Multiplanar, multiecho pulse sequences of the brain and surrounding structures were obtained according to standard protocol without intravenous contrast.  Angiographic images of the head were obtained using MRA technique without contrast.  Comparison: 09/25/2008 head CT.  No comparison MR.  MRI HEAD  Findings:  Motion degraded exam.  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  Tiny small acute infarcts involving the posterior left temporal lobe and left parietal - occipital junction.  Tiny acute infarcts left cerebellum and right cerebellar tonsil.  This distribution of infarcts raises possibility of embolic disease.  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  No intracranial hemorrhage.  Mild small vessel disease type changes.  No intracranial mass lesion detected on this unenhanced exam.  No hydrocephalus.  Opacification in the right mastoid air cells and right middle ear. No obstructing lesion is noted in the region of the posterior- superior nasopharynx causing eustachian tube dysfunction.  No findings of thrombosis of the adjacent dural sinuses.  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  Tiny small acute infarcts involving the posterior left temporal lobe and left parietal - occipital junction.  Tiny acute infarcts left cerebellum and right cerebellar tonsil.  This distribution of infarcts raises possibility of embolic disease.  As the patient has findings  of diskitis of the cervical spine as discussed below, septic emboli not entirely excluded.  Opacification right mastoid air cells and right middle ear as noted above.  MRA HEAD  Findings: Anterior circulation without medium or large size vessel significant stenosis or occlusion.  Mild narrowing involving portions of the A1 segment of the anterior cerebral artery bilaterally and the M1 segment of the middle cerebral artery bilaterally.  Mild branch vessel irregularity middle cerebral artery and anterior cerebral artery distribution.  Codominant vertebral arteries.  No high-grade  stenosis of the distal vertebral arteries.  Very mild narrowing and irregularity mid aspect of the basilar artery.  Poor delineation AICAs.  Mild irregularity and narrowing involving portions of the superior cerebellar arteries.  No aneurysmal or vascular malformation noted.  IMPRESSION: Branch vessel atherosclerotic type changes as noted above.  MRI CERVICAL SPINE WITHOUT CONTRAST  Technique:  Multiplanar and multiecho pulse sequences of the cervical spine, to include the craniocervical junction and cervicothoracic junction, were obtained without intravenous contrast.  Comparison:   None  Findings:  Motion degraded exam.  Abnormal appearance of the C6-7 vertebra and disc space highly suspicious for a diskitis/osteomyelitis with abnormal appearance of the C6-7 and C7-T1 facet joints extending to involve the left T1 pedicle suspicious for involvement by infection.  Epidural abscess suspected although evaluation limited by motion and the fact that no contrast was administered.  This contributes to spinal stenosis and mild cord flattening (most notable C6 level).  Extension of infectious process into the neural foramen on the right at the C5-6 and C6 level and on the left at the C5-6 through C7-T1 level.  Abnormal signal interspinous region most notable C5-6 level with extending from the C2 level to the C7 level.  Findings suspicious for spread  of infection.  There is also edema within paraspinal musculature throughout the posterior neck consistent with cellulitis.  Slight increased signal within the C6 and C7 spinous process suspicious for involvement by by infection.  No definitive cervical cord signal abnormality noted on this motion degraded exam.  Both vertebral arteries and carotid arteries are patent.  C2-3:  Bulge.  Facet joint degenerative changes.  Minimal uncinate hypertrophy.  C3-4:  Broad-based disc osteophyte complex.  Moderate spinal stenosis.  Mild cord flattening.  Moderate bilateral foraminal narrowing.  C4-5:  Bulge with tiny central protrusion.  Mild spinal stenosis. Minimal bilateral foraminal narrowing.  C5-6:  Bulge/broad-based protrusion.  Mild spinal stenosis.  Mild cord flattening.  Mild bilateral foraminal narrowing.  C6-7:  Diskitis as noted above.  C7-T1:  Facet joint degenerative changes.  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  Epidural abscess suspected although evaluation limited without contrast.  This contributes to spinal stenosis most prominent C6 level.  Cervical spondylotic changes most notable C3-4 level.  Please see above further detail.  Critical Value/emergent results were called by telephone at the time of interpretation on 09/15/2012 at 11:09 am to Dr. McDiarmid, who verbally acknowledged these results.   Original Report Authenticated By: Genia Del, M.D.    Mr Cervical Spine Wo Contrast  09/15/2012  *RADIOLOGY REPORT*  Clinical Data:  Altered mental status.  Pyelonephritis possibly with urosepsis.  MRI BRAIN WITHOUT CONTRAST MRA HEAD WITHOUT CONTRAST  Technique: Multiplanar, multiecho pulse sequences of the brain and surrounding structures were obtained according to standard protocol without intravenous contrast.  Angiographic images of the head were obtained using MRA  technique without contrast.  Comparison: 09/25/2008 head CT.  No comparison MR.  MRI HEAD  Findings:  Motion degraded exam.  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  Tiny small acute infarcts involving the posterior left temporal lobe and left parietal - occipital junction.  Tiny acute infarcts left cerebellum and right cerebellar tonsil.  This distribution of infarcts raises possibility of embolic disease.  As  the patient has findings of diskitis of the cervical spine as discussed below, septic emboli not entirely excluded.  No intracranial hemorrhage.  Mild small vessel disease type changes.  No intracranial mass lesion detected on this unenhanced exam.  No hydrocephalus.  Opacification in the right mastoid air cells and right middle ear. No obstructing lesion is noted in the region of the posterior- superior nasopharynx causing eustachian tube dysfunction.  No findings of thrombosis of the adjacent dural sinuses.  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  Tiny small acute infarcts involving the posterior left temporal lobe and left parietal - occipital junction.  Tiny acute infarcts left cerebellum and right cerebellar tonsil.  This distribution of infarcts raises possibility of embolic disease.  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  Opacification right mastoid air cells and right middle ear as noted above.  MRA HEAD  Findings: Anterior circulation without medium or large size vessel significant stenosis or occlusion.  Mild narrowing involving portions of the A1 segment  of the anterior cerebral artery bilaterally and the M1 segment of the middle cerebral artery bilaterally.  Mild branch vessel irregularity middle cerebral artery and anterior cerebral artery distribution.  Codominant vertebral arteries.  No high-grade stenosis of the distal vertebral arteries.  Very mild narrowing and irregularity mid aspect of the basilar artery.  Poor delineation AICAs.  Mild irregularity and narrowing involving portions of the superior cerebellar arteries.  No aneurysmal or vascular malformation noted.  IMPRESSION: Branch vessel atherosclerotic type changes as noted above.  MRI CERVICAL SPINE WITHOUT CONTRAST  Technique:  Multiplanar and multiecho pulse sequences of the cervical spine, to include the craniocervical junction and cervicothoracic junction, were obtained without intravenous contrast.  Comparison:   None  Findings:  Motion degraded exam.  Abnormal appearance of the C6-7 vertebra and disc space highly suspicious for a diskitis/osteomyelitis with abnormal appearance of the C6-7 and C7-T1 facet joints extending to involve the left T1 pedicle suspicious for involvement by infection.  Epidural abscess suspected although evaluation limited by motion and the fact that no contrast was administered.  This contributes to spinal stenosis and mild cord flattening (most notable C6 level).  Extension of infectious process into the neural foramen on the right at the C5-6 and C6 level and on the left at the C5-6 through C7-T1 level.  Abnormal signal interspinous region most notable C5-6 level with extending from the C2 level to the C7 level.  Findings suspicious for spread of infection.  There is also edema within paraspinal musculature throughout the posterior neck consistent with cellulitis.  Slight increased signal within the C6 and C7 spinous process suspicious for involvement by by infection.  No definitive cervical cord signal abnormality noted on this motion degraded exam.  Both vertebral  arteries and carotid arteries are patent.  C2-3:  Bulge.  Facet joint degenerative changes.  Minimal uncinate hypertrophy.  C3-4:  Broad-based disc osteophyte complex.  Moderate spinal stenosis.  Mild cord flattening.  Moderate bilateral foraminal narrowing.  C4-5:  Bulge with tiny central protrusion.  Mild spinal stenosis. Minimal bilateral foraminal narrowing.  C5-6:  Bulge/broad-based protrusion.  Mild spinal stenosis.  Mild cord flattening.  Mild bilateral foraminal narrowing.  C6-7:  Diskitis as noted above.  C7-T1:  Facet joint degenerative changes.  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  Epidural abscess suspected although evaluation limited without contrast.  This contributes to spinal stenosis most prominent C6 level.  Cervical spondylotic changes most notable C3-4 level.  Please see above further detail.  Critical Value/emergent results were called by telephone at the time of interpretation on 09/15/2012 at 11:09 am to Dr. McDiarmid, who verbally acknowledged these results.   Original Report Authenticated By: Genia Del, M.D.    Dg Foot Complete Right  09/14/2012  *RADIOLOGY REPORT*  Clinical Data: Pain  RIGHT FOOT COMPLETE - 3+ VIEW  Comparison: Yesterday  Findings: A BB has been utilized to mark the scan location of pain. There is corresponds to the area of skin just dorsal to the the wire is transfixed in the distal first metatarsal.  Stable hardware compared with yesterday.  No new fracture.  Degenerative changes at the first metatarsophalangeal joint.  Central erosions involving the first metatarsal head are stable.  No aggressive periosteal reaction.  IMPRESSION: A skin BB marks the location of pain overlying the hardware in the distal first metatarsal.  Stable degenerative and postoperative changes.   Original Report Authenticated By: Marybelle Killings, M.D.      ASSESSMENT: Pt is a 66 yo man with HTN who presented with bacteremia from septic joint s/p removal of hardware.  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  Tonight pt had CP on coughing, EKG was obtained showing ST elevations and "acute MI" on read, so cardiology was consulted for further management.  Echo yesterday showed normal EF and small to moderate pericardial effusion.  RECOMMENDATIONS: - EKGs, sx, pericardial effusion are all consistent with pericarditis.  Other potential etiologies for his pain and EKG changes include ?emobli down a coronary if pt does have endocarditis vs PE given pt's recent ortho surgery, calf pain, new onset AF.  ST changes improved somewhat on serial EKGs and no reciprocal changes, arguing against true STEMI from plaque rupture - treat with NSAIDs and colchicine  - cycle cardiac enzymes, check serial EKGs - BB for rate control of AF - he received 5 mg IV metop, start 25 mg metop PO q6 hours if BPs can tolerate, otherwise, can consider dig loading - CHADS vasc score is 2, so he will need anticoagulation if he stays in AF for more that 24-48 hours - would need to know if ok to start heparin from standpoint of multiple embolic events to brain.  Would also be concerned about giving anticoagulation during acute pericarditis with pericardial effusion - this can result in hemorrhagic transformation of the effusion.  Will continue to monitor - TEE is planned to look for vegetations in the near future - could also consider DCCV if pt's HR are difficult to control    Thank you for this consultation.  Will continue to follow.  Signed: Lyn Records, MD Cardiology Fellow 09/16/2012, 1:42 AM

## 2012-09-16 NOTE — Progress Notes (Addendum)
Patient Name: Ryan [name] Date of Encounter: 09/16/2012    SUBJECTIVE: The chart has been reviewed. He has  chest pain currently and mild dyspnea. Chest pain only with deep breathing.  TELEMETRY:  Now in ST.: Filed Vitals:   09/16/12 0210 09/16/12 0300 09/16/12 0404 09/16/12 0900  BP: 97/62 104/60 116/67   Pulse: 104 106 102   Temp:  98.2 F (36.8 C)  98.2 F (36.8 C)  TempSrc:  Oral  Oral  Resp: 19 16    Height:      Weight:      SpO2: 93% 95%      Intake/Output Summary (Last 24 hours) at 09/16/12 1115 Last data filed at 09/16/12 0900  Gross per 24 hour  Intake   2223 ml  Output   1300 ml  Net    923 ml    LABS: Basic Metabolic Panel:  Basename 09/16/12 0520 09/15/12 1150  NA 131* 132*  K 4.0 3.7  CL 99 100  CO2 20 18*  GLUCOSE 100* 116*  BUN 20 19  CREATININE 1.10 0.98  CALCIUM 8.5 8.1*  MG -- --  PHOS -- --   CBC:  Basename 09/16/12 0520 09/15/12 1150  WBC 13.3* 14.4*  NEUTROABS -- --  HGB 10.1* 9.5*  HCT 29.5* 27.3*  MCV 65.3* 64.4*  PLT 210 202   Cardiac Enzymes:  Basename 09/16/12 0220 09/13/12 1234  CKTOTAL -- 2336*  CKMB -- --  CKMBINDEX -- --  TROPONINI 2.04* --    Basename 09/14/12 0425  HGBA1C 6.5*   Fasting Lipid Panel:  Basename 09/16/12 0520  CHOL 121  HDL 6*  LDLCALC 80  TRIG 174*  CHOLHDL 20.2  LDLDIRECT --    Radiology/Studies:  *RADIOLOGY REPORT*  Clinical Data: Cough, chest pain.  CHEST - 1 VIEW  Comparison: 09/12/2012  Findings: There is cardiomegaly with vascular congestion. Right  base predominately linear opacities, likely atelectasis. This is  increased since prior study. Increasing left base opacity which  could represent atelectasis or infiltrate. No definite effusion.  No acute bony abnormality.  IMPRESSION:  Increasing bibasilar opacities bilaterally.  Cardiomegaly, vascular congestion.  Original Report Authenticated By: Rolm Baptise, M.D.   Physical Exam: Blood pressure 116/67, pulse 102,  temperature 98.2 F (36.8 C), temperature source Oral, resp. rate 16, height 6' (1.829 m), weight 84.4 kg (186 lb 1.1 oz), SpO2 95.00%. Weight change:    Loud 3 component pericardial friction rub!  No obvious JVD  Lungs clear anteriorly  ASSESSMENT:  1. Acute pericarditis, in this setting could represent bacterial infection, and could potentially require surgical drainage. No evidence tamponade.  2. Atrial fibrillation due to pericarditis, now resolved.  3. Cerebral emboli  4. Septic right hand joint  5. ? Acute diastolic heart failure  Plan:  1. Serial ECG's to r/o PR prolongation which could represent perivalvular abscess 2. Watch for evidence of tamponade 3. TEE later this week, but clearly should be treated as endocarditis. TEE will  help define if there is/extent of cardiac involvement. 4. No IV heparin. 5. Start oral amiodarone to prevent recurrent AF 6. BNP Signed, [name] 09/16/2012, 11:15 AM

## 2012-09-17 DIAGNOSIS — R7881 Bacteremia: Secondary | ICD-10-CM

## 2012-09-17 DIAGNOSIS — B958 Unspecified staphylococcus as the cause of diseases classified elsewhere: Secondary | ICD-10-CM

## 2012-09-17 LAB — BASIC METABOLIC PANEL
BUN: 21 mg/dL (ref 6–23)
CO2: 19 mEq/L (ref 19–32)
Calcium: 8.2 mg/dL — ABNORMAL LOW (ref 8.4–10.5)
Chloride: 104 mEq/L (ref 96–112)
Creatinine, Ser: 0.9 mg/dL (ref 0.50–1.35)
GFR calc Af Amer: >90 mL/min (ref >90)
GFR calc non Af Amer: 85 mL/min — ABNORMAL LOW (ref >90)
Glucose, Bld: 105 mg/dL — ABNORMAL HIGH (ref 70–99)
Potassium: 3.9 mEq/L (ref 3.5–5.1)
Sodium: 135 mEq/L (ref 135–145)

## 2012-09-17 LAB — CBC
HCT: 27.3 % — ABNORMAL LOW (ref 39.0–52.0)
Hemoglobin: 8.9 g/dL — ABNORMAL LOW (ref 13.0–17.0)
MCH: 21 pg — ABNORMAL LOW (ref 26.0–34.0)
MCHC: 32.6 g/dL (ref 30.0–36.0)
MCV: 64.5 fL — ABNORMAL LOW (ref 78.0–100.0)
Platelets: 219 10*3/uL (ref 150–400)
RBC: 4.23 MIL/uL (ref 4.22–5.81)
RDW: 15.2 % (ref 11.5–15.5)
WBC: 10.6 10*3/uL — ABNORMAL HIGH (ref 4.0–10.5)

## 2012-09-17 LAB — WOUND CULTURE

## 2012-09-17 MED ORDER — SODIUM CHLORIDE 0.9 % IV SOLN: INTRAVENOUS | Status: DC

## 2012-09-17 MED ORDER — INFLUENZA VIRUS VACC SPLIT PF IM SUSP
0.5000 mL | INTRAMUSCULAR | Status: AC
  Filled 2012-09-17: qty 0.5

## 2012-09-17 MED ORDER — FUROSEMIDE 10 MG/ML IJ SOLN
40.0000 mg | Freq: Two times a day (BID) | INTRAMUSCULAR | Status: DC
  Administered 2012-09-17 – 2012-09-20 (×6): 40 mg via INTRAVENOUS
  Filled 2012-09-17 (×8): qty 4

## 2012-09-17 MED ORDER — FUROSEMIDE 40 MG PO TABS
40.0000 mg | ORAL_TABLET | Freq: Every day | ORAL | Status: DC
  Administered 2012-09-17: 40 mg via ORAL
  Filled 2012-09-17: qty 1

## 2012-09-17 MED ORDER — PNEUMOCOCCAL VAC POLYVALENT 25 MCG/0.5ML IJ INJ
0.5000 mL | INJECTION | INTRAMUSCULAR | Status: AC
  Filled 2012-09-17: qty 0.5

## 2012-09-17 NOTE — Progress Notes (Signed)
Family Medicine Teaching Service Daily Progress Note Service Page: [phone]  Patient Assessment: 66 y.o. year old male with acute encephalopathy (now resolved) secondary to sepsis from multiple sources, s/p I&D of R wrist and R MTP lesion (with HW removal), Multiple small R hemisphere infarcts conserning for embolic vs. Septic emboli, C6-T1 Diskitis, and E. Coli UTI.  .  Subjective:  Not feeling well today> States that Wrist is a contsant throbbing pain and that his Low back and buttock is now hurting which he feels is positional. Having some chest pain, especially with coughing, which is unchanged from yesterday.   Objective: Temp:  [97.5 F (36.4 C)-98.2 F (36.8 C)] 97.6 F (36.4 C) (12/29 0356) Pulse Rate:  [73-91] 78  (12/29 0356) Resp:  [11-23] 21  (12/29 0356) BP: (94-107)/(56-66) 107/56 mmHg (12/29 0356) SpO2:  [92 %-96 %] 96 % (12/29 0356)  Exam: Gen: NAD, alert, cooperative with exam, lyingg in bed with R hand suspended.  HEENT: NCAT, EOMI, MMM CV: RRR, no murmur appreciated Resp: CTABL, no wheezes, non-labored Abd: Tense, tender to palpation throughout, BS hyperactive Ext: No edema, warm, R foot wrapped in clean ACE bandage, SCDs in place Neuro: Alert and oriented times 3, No gross deficits   I have reviewed the patient's medications, labs, imaging, and diagnostic testing.  Notable results are summarized below.  CBC BMET   Lab 09/16/12 0520 09/15/12 1150 09/14/12 0435  WBC 13.3* 14.4* 12.8*  HGB 10.1* 9.5* 9.4*  HCT 29.5* 27.3* 27.2*  PLT 210 202 184    Lab 09/16/12 0520 09/15/12 1150 09/14/12 1827  NA 131* 132* 134*  K 4.0 3.7 4.4  CL 99 100 102  CO2 20 18* 20  BUN 20 19 20   CREATININE 1.10 0.98 1.04  GLUCOSE 100* 116* 96  CALCIUM 8.5 8.1* 8.3*     Imaging/Diagnostic Tests: V/Q scan 12/28 IMPRESSION:  Low probability study for pulmonary embolus.  CXR 12/28 IMPRESSION:  Increasing bibasilar opacities bilaterally.  MRI Head 12/27 Several small  areas of acute infarction involving the right  hemisphere including portions of the; right occipital lobe, right  temporal lobe, right opercular region, posterior right frontal lobe  and right parietal lobe. Tiny small acute infarcts involving the  posterior left temporal lobe and left parietal - occipital  junction. Tiny acute infarcts left cerebellum and right cerebellar  tonsil.  This distribution of infarcts raises possibility of embolic  disease. As the patient has findings of diskitis of the cervical  spine as discussed below, septic emboli not entirely excluded.  Opacification right mastoid air cells and right middle ear as noted  Above.  MRA head 12/27 Branch vessel atherosclerotic type changes as noted above.  MRI Cervical Spine 12/27 C6-7 diskitis with spread of infection to the adjacent facet  joints, spinous process and interspinous ligaments/posterior  paraspinal musculature as discussed above. Epidural abscess  suspected although evaluation limited without contrast. This  contributes to spinal stenosis most prominent C6 level.  Cervical spondylotic changes most notable C3-4 level.   Plan: Ryman Stfleur is a 66 y.o. year old male with acute encephalopathy secondary to sepsis from multiple sources.    Pericarditis - Cardiology on board, greatly appreciate reccomendations - PE less likely with VQ scan - Likely Pericarditis causing Afib, concern for purulence - Serial ECGs, monitor for tamponade, TEE later in the week, no IV heparin, Oral Amiodarone - BNP 3458  MSSA Bacteremia-  - Afebrile, Tachycardia and tachypnea improving - Blood Cx 12/23 shows pan sensitive  MSSA, Repeat from 12/26 NGTD - ID on Board-   - Continue Ancef, likely will need 6-8 weeks, needs TEE  - Picc Line  - Reccomendations greatly appreciated - R wrist   - OR by Hand surgery on 12/26 for debridement- Cx with mod MSSA  - Hand surgery on board- greatly appreciate reccomendations - R MTP-   -  No osseus changes on XR, Open ulcer  - Debridment and hardware removal on 12/26   - Ortho on board - greatly appreciate reccomendations - Continue to trend CBC, ESR- draw later today  - WBC stable around 13  - ESR trending down 81 -> 75 -> 67  - Hgb stable around 10.1  Ischemic CVA secondary to most likely septic emboli along with 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  - MRI shows several small areas of acute infarction involving the right hemisphere, multiple areas with branch vessel atherosclerosis, C6-7 diskitis with spread of infection to the adjacent facet joints, spinous process and interspinous ligaments/posterior paraspinal musculature as discussed above. Epidural abscess suspected although evaluation limited without contrast.  - Neurosurg consult and wants to treat without surgical intervention. - Greatly appreciate input.   PAD/Ischemic limb:  - Vascular on board- appreciate reccomendations - concern on admission that LE were ischemic. Workup performed by vascular in ED which was positive for PAD on ABI testing. No reported claudication by pt.  - No heparin currently due to cerebral emboli - Doppler flow PT and peroneal leg, Vascular plans to defer angio if wound begins to heal. OP possibly if it is necessary  Acute Kidney Injury.  - Dehydration vs infection induced vs CKD.  - CR 1.4 on admission 1.1 yesterday, pending today. Unsure of baseline.  - Producing urine - BMP daily, continue to follow   Elevated Transaminases -  - Pt AST:ALT 2:1 consistent with alcoholic picture. EtOH on admission < 11  - on CIWA   Newly Dx DM II - A1C 6.5 on check  - Consider sensitive SSI if starts to develop hyperglycemia, Blood glucose fine for now.   FEN/GI:  - Passed swallow study at bedside. Carb modified diet  - 1/2 NS @ 125  cc/hr.  - Strict I/O  Prophylaxis: SCD Disposition: Step down for now, Home with home health pending further workup and improvement  Kenn File, MD 09/17/2012, 8:11 AM

## 2012-09-17 NOTE — Progress Notes (Signed)
[facility] [place]  Progress Note Bypass Surgery  History of Present Illness  Ryan [name] is a 66 y.o. male who has hx of right LE claudication and PAD. Pt states for last month he has been unable to ambulate more than a block. He denies pain, numbness in right foot. He is S/P removal hardware right great toe.  VASC. LAB Studies:        ABI: Right 0.48;  Left 0.97; Duplex of the right lower extremity: Diffuse mild calcific plaque throughout the femoral and popliteal artery. Waveforms triphasic. Significant stenosis at the trifurcation   Significant Diagnostic Studies: CBC Lab Results  Component Value Date   WBC 13.3* 09/16/2012   HGB 10.1* 09/16/2012   HCT 29.5* 09/16/2012   MCV 65.3* 09/16/2012   PLT 210 09/16/2012    BMET     Component Value Date/Time   NA 131* 09/16/2012 0520   K 4.0 09/16/2012 0520   CL 99 09/16/2012 0520   CO2 20 09/16/2012 0520   GLUCOSE 100* 09/16/2012 0520   BUN 20 09/16/2012 0520   CREATININE 1.10 09/16/2012 0520   CALCIUM 8.5 09/16/2012 0520   GFRNONAA 67* 09/16/2012 0520   GFRAA 78* 09/16/2012 0520    COAG Lab Results  Component Value Date   INR 1.25 09/13/2012   No results found for this basename: PTT    Physical Examination  BP Readings from Last 3 Encounters:  09/17/12 107/56  09/17/12 107/56   Temp Readings from Last 3 Encounters:  09/17/12 97.6 F (36.4 C) Oral  09/17/12 97.6 F (36.4 C) Oral   SpO2 Readings from Last 3 Encounters:  09/17/12 96%  09/17/12 96%   Pulse Readings from Last 3 Encounters:  09/17/12 78  09/17/12 78    Pt is A&O x 3 right lower extremity: dressing is/are clean,dry.intact, and   Limb is warm; with good color, sensation and motion  Right peroneal Pedis pulse is monophasic by Doppler Right Posterior tibial pulse is  monophasic by Doppler   Assessment/Plan: Pt. Stable this am Continue wound care per ortho Doppler flow PT and peroneal right leg If wound on  right foot is healing Will defer angio  [place] J [phone] 09/17/2012 7:59 AM

## 2012-09-17 NOTE — Progress Notes (Signed)
  Echocardiogram 2D Echocardiogram limited has been performed.  Zayden Hahne FRANCES 09/17/2012, 2:46 PM

## 2012-09-17 NOTE — Progress Notes (Signed)
Family Medicine Teaching Service Attending Note  I interviewed and examined patient Ryan [name] and reviewed their tests and x-rays.  I discussed with Dr. Wendi Snipes and reviewed their note for today.  I agree with their assessment and plan.     Additionally  No chest pain or shortness of breath  No acute distress  Continue to monitor for complications of disseminated infection primarily cardiac tamponade or recurrent septic embolic CVA.  Cardiology to follow up echo

## 2012-09-17 NOTE — Progress Notes (Addendum)
Please see the updated echocardiogram report. The pericardial effusion is the same or smaller. The mitral valve now clearly demonstrates evidence of a lateral annular mass representing vegetation and possibly myocardial abscess. He also now has significant mitral regurgitation that is at least moderate and has multiple jets.  The patient's dyspnea, the elevated BNP, the mitral regurgitation, make the diagnosis of acute diastolic heart failure very clear. I gave Lasix earlier this morning and the patient is still currently having intermittent dyspnea.  I have contacted [facility] to follow along with [place]. I suspect he will need surgical management of his mitral valve as the regurgitation appears to be worsening despite appropriate antibiotic therapy.  I discussed the new findings with the patient, his brother, and sister-in-law. The wife and son were not present in the room. I am not sure we should perform a transesophageal echo tomorrow although I will see what the consensus of infectious disease and cardiac surgery is given the new data presented this evening .

## 2012-09-17 NOTE — Progress Notes (Addendum)
INFECTIOUS DISEASE PROGRESS NOTE  ID: Ryan [name] is a 66 y.o. male with afib, PVD, chronic right foot wound with HW, presented with sepsis found to have MSSA bacteremia c/b MSSA right wrist septic arthritis s/p I x D, MSSA right foot wound s/p I x D & HW removal, C6-C7-T1 diskitis/osteo, and several small infarcts to right hemisphere concerning for embolic vs. 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 Also found to have ecoli uti.  Principal Problem:  *Bacteremia due to Staphylococcus Active Problems:  Fever  Peripheral vascular disease  UTI (urinary tract infection)  Sepsis with metabolic encephalopathy  Septic arthritis of wrist, right  Acute ischemic stroke  Acute pericarditis, unspecified  Atrial fibrillation  Acute diastolic heart failure  Acute combined systolic and diastolic heart failure  Subjective: Feeling slightly better, has low back pain from laying in back. Still has episodic chills; interestingly, he reports having steroid injection for pinched nerve of neck roughly 2-3 wks ago.   Abtx:  Day #6 Cefazolin Day #3 (12/24-27 piptazo)  Medications:  Scheduled:    . amiodarone  200 mg Oral BID  . aspirin  81 mg Oral Daily  . atorvastatin  40 mg Oral q1800  .  ceFAZolin (ANCEF) IV  2 g Intravenous Q8H  . colchicine  0.6 mg Oral BID  . folic acid  1 mg Oral Daily  . furosemide  40 mg Oral Daily  . ibuprofen  800 mg Oral TID WC  . metoprolol tartrate  25 mg Oral Q6H  . multivitamin with minerals  1 tablet Oral Daily  . sodium chloride  3 mL Intravenous Q12H  . thiamine  100 mg Oral Daily    Objective: Vital signs in last 24 hours: Temp:  [97.5 F (36.4 C)-98.1 F (36.7 C)] 98.1 F (36.7 C) (12/29 0700) Pulse Rate:  [73-91] 84  (12/29 0748) Resp:  [11-23] 21  (12/29 0748) BP: (94-107)/(56-66) 107/64 mmHg (12/29 0748) SpO2:  [92 %-96 %] 96 % (12/29 0748)   General appearance:  alert, cooperative and mild distress Resp: clear to auscultation bilaterally Cardio: regular rate and rhythm GI: normal findings: bowel sounds normal and soft, non-tender Extremities: r wrist and ankel wrappped  Lab Results  Basename 09/16/12 0520 09/15/12 1150  WBC 13.3* 14.4*  HGB 10.1* 9.5*  HCT 29.5* 27.3*  NA 131* 132*  K 4.0 3.7  CL 99 100  CO2 20 18*  BUN 20 19  CREATININE 1.10 0.98  GLU -- --   Sedimentation Rate  Basename 09/16/12 0520  ESRSEDRATE 24*    Microbiology: 12/24 urine cx Ecoli (pan sensitive) 12/24 blood cx x 2 MSSA ( oxa S, pcn S) 12/24 right wrist arthrocentesis cx MSSA( oxa S, pcn R) 12/25 right foot cx MSSA (oxa s, pcn S) 12/25 right wrist cx MSSa ( oxa S, pcn S) 12/26 blood cx x 1 NGTD  Studies/Results: Mri 12/27 brain:Several small areas of acute infarction involving the right  hemisphere including portions of the; right occipital lobe, right  temporal lobe, right opercular region, posterior right frontal lobe  and right parietal lobe. Tiny small acute infarcts involving the  posterior left temporal lobe and left parietal - occipital  junction. Tiny acute infarcts left cerebellum and right cerebellar  tonsil.  This distribution of infarcts raises possibility of embolic  disease. As the patient has findings of diskitis of the cervical  spine as discussed below, septic emboli not entirely excluded.  Opacification  right mastoid air cells and right middle ear as noted above.  Mri 12/27 c spine: Abnormal appearance of the C6-7 vertebra and disc space highly  suspicious for a diskitis/osteomyelitis with abnormal appearance of  the C6-7 and C7-T1 facet joints extending to involve the left T1  pedicle suspicious for involvement by infection. Epidural abscess  suspected although evaluation limited by motion and the fact that  no contrast was administered. This contributes to spinal stenosis  and mild cord flattening (most notable C6 level).  Extension of  infectious process into the neural foramen on the right at the C5-6  and C6 level and on the left at the C5-6 through C7-T1 level.  Abnormal signal interspinous region most notable C5-6 level with  extending from the C2 level to the C7 level. Findings suspicious  for spread of infection. There is also edema within paraspinal  musculature throughout the posterior neck consistent with  cellulitis.  Slight increased signal within the C6 and C7 spinous process  suspicious for involvement by by infection.  No definitive cervical cord signal abnormality noted on this motion  degraded exam.   Assessment/Plan: Ryan [name] is a 66 y.o. male with afib, PVD, chronic right foot wound with HW, presented with sepsis found to have MSSA bacteremia c/b MSSA right wrist septic arthritis s/p I x D, MSSA right foot wound s/p I x D & HW removal, C6-C7-T1 diskitis/osteo, and several small infarcts to right hemisphere concerning for embolic vs. 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 Also found to have ecoli uti.  1) MSSA bacteremia with metastatic disease (cervical diskitis and right wrist septic arthritis and right foot wound) = continue with cefazolin 2gm IV q8hr for a minimum of 6-8 wks. He has cleared his bacteremia 12/26 blood cx. Recommend neurosurgery consultation to see if imaging and clinical picture would require surgical debridement  2) embolic infarct concerning for septic emboli/endocarditis = recommend to get TEE to evaluate for endocarditis and any valvular insufficiency  3) pericardial effusion = similar to cardiology concerns, given the patient's MSSA infection, there is a concern for purulent pericarditis. Defer to cardiology for need for drainaged based upon repeat evaluation by echo  4) ecoli uti = resolved, treated  5) if he has fever = recommend to repeat blood cultures         Kaysie Michelini,  [facility] [id number] 09/17/2012, 11:06 AM   LOS: 5 days

## 2012-09-17 NOTE — Progress Notes (Signed)
Patient Name: Ryan [name] Date of Encounter: 09/17/2012    SUBJECTIVE: The patient doesn't feel that well this morning. It hurts to take deep breath. He feels mildly short of breath. His back is hurting. He doesn't have much appetite. He did get some sleep.  TELEMETRY:  No recurrence of atrial fibrillation. Monitor reveals sinus rhythm at 88 beats per minute Filed Vitals:   09/17/12 0200 09/17/12 0356 09/17/12 0700 09/17/12 0748  BP: 102/58 107/56  107/64  Pulse:  78  84  Temp:  97.6 F (36.4 C) 98.1 F (36.7 C)   TempSrc:  Oral Oral   Resp:  21  21  Height:      Weight:      SpO2:  96%  96%    Intake/Output Summary (Last 24 hours) at 09/17/12 1004 Last data filed at 09/17/12 0700  Gross per 24 hour  Intake   3230 ml  Output   3600 ml  Net   -370 ml    LABS: Basic Metabolic Panel:  Basename 09/16/12 0520 09/15/12 1150  NA 131* 132*  K 4.0 3.7  CL 99 100  CO2 20 18*  GLUCOSE 100* 116*  BUN 20 19  CREATININE 1.10 0.98  CALCIUM 8.5 8.1*  MG -- --  PHOS -- --   CBC:  Basename 09/16/12 0520 09/15/12 1150  WBC 13.3* 14.4*  NEUTROABS -- --  HGB 10.1* 9.5*  HCT 29.5* 27.3*  MCV 65.3* 64.4*  PLT 210 202   Cardiac Enzymes:  Basename 09/16/12 2117 09/16/12 1411 09/16/12 1150  CKTOTAL -- -- --  CKMB -- -- --  CKMBINDEX -- -- --  TROPONINI 0.83* 0.86* 0.98*   BNP    Component Value Date/Time   PROBNP 3458.0* 09/16/2012 2117    Radiology/Studies:  *RADIOLOGY REPORT*  Clinical Data: Cough, chest pain.  CHEST - 1 VIEW  Comparison: 09/12/2012  Findings: There is cardiomegaly with vascular congestion. Right  base predominately linear opacities, likely atelectasis. This is  increased since prior study. Increasing left base opacity which  could represent atelectasis or infiltrate. No definite effusion.  No acute bony abnormality.  IMPRESSION:  Increasing bibasilar opacities bilaterally.  Cardiomegaly, vascular congestion.  Original Report Authenticated  By: Rolm Baptise, M.D.    Physical Exam: Blood pressure 107/64, pulse 84, temperature 98.1 F (36.7 C), temperature source Oral, resp. rate 21, height 6' (1.829 m), weight 84.4 kg (186 lb 1.1 oz), SpO2 96.00%. Weight change:    Moderate jugular vein distention with prominent V-wave  Decreased breath sounds at both bases with faint mid lung rales.  3 component pericardial friction rub and possibly also a systolic murmur at the apex.  No edema.  ASSESSMENT:  1. Acute pericarditis of uncertain cause, I am concerned about the possibility of purulent pericarditis in setting of bacteremia.  2. Acute diastolic heart failure  3. Staphylococcal aureus bacteremia with evidence of multifocal embolic/hematogenous foci of infection  4. 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 No recurrence of atrial fibrillation. Amiodarone started to suppress recurrent episodes in setting of pericarditis.  Plan:  1. Repeat limited echocardiogram today to reassess pericardial fluid  2. Continue amiodarone to suppress recurrent atrial fibrillation  3. Diuresis to treat acute diastolic heart failure  4. A pericardial effusion is increasing may need to have pericardial drainage  Signed, [name] 09/17/2012, 10:04 AM

## 2012-09-17 NOTE — Progress Notes (Signed)
Subjective: 3 Days Post-Op Procedure(s) (LRB): IRRIGATION AND DEBRIDEMENT EXTREMITY (Right) REMOVAL K-WIRE/PIN EXTREMITY (Right) Wrist sore but improved.  States packing was changed in wound..    Objective: Vital signs in last 24 hours: Temp:  [97.5 F (36.4 C)-98.5 F (36.9 C)] 98.2 F (36.8 C) (12/29 1521) Pulse Rate:  [73-91] 81  (12/29 1218) Resp:  [11-23] 21  (12/29 0748) BP: (94-107)/(56-66) 103/56 mmHg (12/29 1218) SpO2:  [92 %-96 %] 96 % (12/29 0748)  Intake/Output from previous day: 12/28 0701 - 12/29 0700 In: 3355 [P.O.:180; I.V.:3125; IV Piggyback:50] Out: 4100 [Urine:4100] Intake/Output this shift: Total I/O In: 1250 [I.V.:1200; IV Piggyback:50] Out: 400 [Urine:400]   Basename 09/17/12 1120 09/16/12 0520 09/15/12 1150  HGB 8.9* 10.1* 9.5*    Basename 09/17/12 1120 09/16/12 0520  WBC 10.6* 13.3*  RBC 4.23 4.52  HCT 27.3* 29.5*  PLT 219 210    Basename 09/17/12 1120 09/16/12 0520  NA 135 131*  K 3.9 4.0  CL 104 99  CO2 19 20  BUN 21 20  CREATININE 0.90 1.10  GLUCOSE 105* 100*  CALCIUM 8.2* 8.5   No results found for this basename: LABPT:2,INR:2 in the last 72 hours  intact capillary refill all digits.  Some pins and needles sensation in digits.  wiggling all digits.  wound without erythema or streaking.  swelling decreased.  Assessment/Plan: 3 Days Post-Op Procedure(s) (LRB): IRRIGATION AND DEBRIDEMENT EXTREMITY (Right) REMOVAL K-WIRE/PIN EXTREMITY (Right) Continue with hydrotherapy.  Elevation as needed.  Active motion of digits.  Multiple other medical issues being addressed.  Will follow.  Judy Goodenow R 09/17/2012, 5:48 PM

## 2012-09-17 NOTE — Progress Notes (Signed)
Agree with above.  Would delay angio until acute issues resolved, and would only recommend if patient is not able to heal wound on his foot.  Annamarie Major

## 2012-09-18 ENCOUNTER — Encounter (HOSPITAL_COMMUNITY): Payer: Self-pay | Admitting: Orthopedic Surgery

## 2012-09-18 ENCOUNTER — Inpatient Hospital Stay (HOSPITAL_COMMUNITY): Payer: Medicare PPO

## 2012-09-18 ENCOUNTER — Encounter (HOSPITAL_COMMUNITY)
Admission: EM | Disposition: A | Discharge status: Skilled Nursing Facility | Payer: Self-pay | Source: Home / Self Care | Attending: Family Medicine

## 2012-09-18 DIAGNOSIS — I330 Acute and subacute infective endocarditis: Secondary | ICD-10-CM | POA: Clinically undetermined

## 2012-09-18 HISTORY — PX: TEE WITHOUT CARDIOVERSION: SHX5443

## 2012-09-18 LAB — CBC
HCT: 29 % — ABNORMAL LOW (ref 39.0–52.0)
Hemoglobin: 9.8 g/dL — ABNORMAL LOW (ref 13.0–17.0)
MCH: 21.6 pg — ABNORMAL LOW (ref 26.0–34.0)
MCHC: 33.8 g/dL (ref 30.0–36.0)
MCV: 63.9 fL — ABNORMAL LOW (ref 78.0–100.0)
Platelets: ADEQUATE 10*3/uL (ref 150–400)
RBC: 4.54 MIL/uL (ref 4.22–5.81)
RDW: 15.1 % (ref 11.5–15.5)
WBC: 11 10*3/uL — ABNORMAL HIGH (ref 4.0–10.5)

## 2012-09-18 LAB — LACTIC ACID, PLASMA: Lactic Acid, Venous: 0.8 mmol/L (ref 0.5–2.2)

## 2012-09-18 LAB — SEDIMENTATION RATE: Sed Rate: 38 mm/hr — ABNORMAL HIGH (ref 0–16)

## 2012-09-18 SURGERY — ECHOCARDIOGRAM, TRANSESOPHAGEAL
Anesthesia: Moderate Sedation

## 2012-09-18 MED ORDER — DIPHENHYDRAMINE HCL 50 MG/ML IJ SOLN
INTRAMUSCULAR | Status: AC
  Filled 2012-09-18: qty 1

## 2012-09-18 MED ORDER — METOPROLOL TARTRATE 50 MG PO TABS
50.0000 mg | ORAL_TABLET | Freq: Once | ORAL | Status: AC
  Administered 2012-09-18: 50 mg via ORAL
  Filled 2012-09-18: qty 1

## 2012-09-18 MED ORDER — FENTANYL CITRATE 0.05 MG/ML IJ SOLN
INTRAMUSCULAR | Status: AC
  Filled 2012-09-18: qty 2

## 2012-09-18 MED ORDER — SODIUM CHLORIDE 0.9 % IJ SOLN
10.0000 mL | Freq: Two times a day (BID) | INTRAMUSCULAR | Status: DC
  Administered 2012-09-18 – 2012-09-25 (×8): 10 mL
  Administered 2012-09-25: 20 mL

## 2012-09-18 MED ORDER — MIDAZOLAM HCL 5 MG/ML IJ SOLN
INTRAMUSCULAR | Status: AC
  Filled 2012-09-18: qty 3

## 2012-09-18 MED ORDER — SODIUM CHLORIDE 0.9 % IJ SOLN
10.0000 mL | INTRAMUSCULAR | Status: DC | PRN
  Filled 2012-09-18: qty 20

## 2012-09-18 MED ORDER — MIDAZOLAM HCL 10 MG/2ML IJ SOLN
INTRAMUSCULAR | Status: DC | PRN
  Administered 2012-09-18 (×3): 1 mg via INTRAVENOUS

## 2012-09-18 MED ORDER — FENTANYL CITRATE 0.05 MG/ML IJ SOLN
INTRAMUSCULAR | Status: DC | PRN
  Administered 2012-09-18 (×2): 25 ug via INTRAVENOUS

## 2012-09-18 MED ORDER — BUTAMBEN-TETRACAINE-BENZOCAINE 2-2-14 % EX AERO
INHALATION_SPRAY | CUTANEOUS | Status: DC | PRN
  Administered 2012-09-18: 2 via TOPICAL

## 2012-09-18 NOTE — CV Procedure (Addendum)
Large vegetation with central cavitation mostly along anterior leaflet of mitral valve with flow traversing through lesion and exiting through 2 separate exit ports. 2.5cm in diameter. Moderate mitral regurgitation associated with this lesion.   Normal EF

## 2012-09-18 NOTE — Interval H&P Note (Signed)
History and Physical Interval Note:  09/18/2012 1:05 PM  Ryan [name]  has presented today for surgery, with the diagnosis of Endocarditis of the mitral valve  The various methods of treatment have been discussed with the patient and family. After consideration of risks, benefits and other options for treatment, the patient has consented to  Procedure(s) (LRB) with comments: TRANSESOPHAGEAL ECHOCARDIOGRAM (TEE) (N/A) as a surgical intervention .  The patient's history has been reviewed, patient examined, no change in status, stable for surgery.  I have reviewed the patient's chart and labs.  Questions were answered to the patient's satisfaction.     Ryan [name]  Discussed risks of espaphageal damage, bleeding, discussed. He is willing to proceed.

## 2012-09-18 NOTE — Progress Notes (Addendum)
Charted in error.

## 2012-09-18 NOTE — Progress Notes (Signed)
Patient ID: Ryan [name], male   DOB: 06-08-1946, 66 y.o.   MRN: [id number] Stable, neuro unchanged. Getting a PICC LINE.  No neck pain

## 2012-09-18 NOTE — Progress Notes (Addendum)
Patient Name: Ryan [name] Date of Encounter: 09/18/2012    SUBJECTIVE: Just getting back in bed after is sitting some during the night. Very mild pleuritic chest discomfort with deep breathing. Breathing has improved some. He is still having waves of dyspnea. No neurological complaints.  TELEMETRY:  Normal sinus rhythm.: Filed Vitals:   09/17/12 1758 09/17/12 2059 09/18/12 0031 09/18/12 0340  BP: 116/62 108/55 105/56 105/57  Pulse: 84 85 73 77  Temp:  97.4 F (36.3 C) 97.6 F (36.4 C) 97.9 F (36.6 C)  TempSrc:  Oral Oral Oral  Resp:  19 20 21   Height:      Weight:      SpO2:  96% 99% 99%    Intake/Output Summary (Last 24 hours) at 09/18/12 0812 Last data filed at 09/18/12 0400  Gross per 24 hour  Intake   3280 ml  Output   3176 ml  Net    104 ml    LABS: Basic Metabolic Panel:  Basename 09/17/12 1120 09/16/12 0520  NA 135 131*  K 3.9 4.0  CL 104 99  CO2 19 20  GLUCOSE 105* 100*  BUN 21 20  CREATININE 0.90 1.10  CALCIUM 8.2* 8.5  MG -- --  PHOS -- --   CBC:  Basename 09/17/12 1120 09/16/12 0520  WBC 10.6* 13.3*  NEUTROABS -- --  HGB 8.9* 10.1*  HCT 27.3* 29.5*  MCV 64.5* 65.3*  PLT 219 210   Cardiac Enzymes:  Basename 09/16/12 2117 09/16/12 1411 09/16/12 1150  CKTOTAL -- -- --  CKMB -- -- --  CKMBINDEX -- -- --  TROPONINI 0.83* 0.86* 0.98*   Fasting Lipid Panel:  Basename 09/16/12 0520  CHOL 121  HDL 6*  LDLCALC 80  TRIG 174*  CHOLHDL 20.2  LDLDIRECT --    Radiology/Studies:  No new  Physical Exam: Blood pressure 105/57, pulse 77, temperature 97.9 F (36.6 C), temperature source Oral, resp. rate 21, height 6' (1.829 m), weight 84.4 kg (186 lb 1.1 oz), SpO2 99.00%. Weight change:    Decreased breath sounds at both bases. Faint mid lung rales are heard.  Cardiac exam reveals a faint pericardial friction rub and a 2-3 of 6 holosystolic MR murmur left lower sternal border and at the apex.  There is no peripheral  edema.  ASSESSMENT:  1. 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 Acute diastolic heart failure secondary to mitral regurgitation  3. Atrial fibrillation has not recurred.  4. Pericarditis is improving and is likely related to contiguous inflammation related to endocarditis, rather than a prolonged infection. Pericardial effusion size has diminished based upon yesterday's echo.  Plan:  1. Transesophageal echo today per Dr. Marlou Porch  2. Minimize IV fluid administration  3. Diuresis  4. N.p.o.  5. TCTS to follow with [place], Dr. Roxan Hockey  Prolonged services required to arrange w/u and management  Signed, [name] 09/18/2012, 8:12 AM

## 2012-09-18 NOTE — Progress Notes (Signed)
Family Medicine Teaching Service Daily Progress Note Service Page: [phone]  Patient Assessment: 66 y.o. year old male with acute encephalopathy (now resolved) secondary to sepsis from multiple sources, s/p I&D of R wrist and R MTP lesion (with HW removal), Multiple small R hemisphere infarcts conserning for embolic vs. Septic emboli, C6-T1 Diskitis, and E. Coli UTI.  .  Subjective:  Feeling ok today considering his current medical issues. Still feeling Short of breath off and on. Still having Throbbing constant pain in his R hand and some pain in his R foot. Some pain on his R lateral thigh described as burning pain.   Objective: Temp:  [97.4 F (36.3 C)-98.5 F (36.9 C)] 97.9 F (36.6 C) (12/30 0340) Pulse Rate:  [73-85] 77  (12/30 0340) Resp:  [19-21] 21  (12/30 0340) BP: (103-116)/(55-62) 105/57 mmHg (12/30 0340) SpO2:  [96 %-99 %] 99 % (12/30 0340)  Exam: Gen: NAD, alert, cooperative with exam, lyingg in bed with R hand suspended.  HEENT: NCAT, EOMI, MMM CV: RRR, no murmur appreciated Resp: CTABL, no wheezes, non-labored Abd: Tense, tender to palpation throughout, BS normoactive Ext: No edema, warm, R foot wrapped in clean ACE bandage, SCDs in place Neuro: Alert and oriented times 3, No gross deficits  I have reviewed the patient's medications, labs, imaging, and diagnostic testing.  Notable results are summarized below.  CBC BMET   Lab 09/17/12 1120 09/16/12 0520 09/15/12 1150  WBC 10.6* 13.3* 14.4*  HGB 8.9* 10.1* 9.5*  HCT 27.3* 29.5* 27.3*  PLT 219 210 202    Lab 09/17/12 1120 09/16/12 0520 09/15/12 1150  NA 135 131* 132*  K 3.9 4.0 3.7  CL 104 99 100  CO2 19 20 18*  BUN 21 20 19   CREATININE 0.90 1.10 0.98  GLUCOSE 105* 100* 116*  CALCIUM 8.2* 8.5 8.1*     Imaging/Diagnostic Tests: V/Q scan 12/28 IMPRESSION:  Low probability study for pulmonary embolus.  CXR 12/28 IMPRESSION:  Increasing bibasilar opacities bilaterally.  MRI Head 12/27 Several small  areas of acute infarction involving the right  hemisphere including portions of the; right occipital lobe, right  temporal lobe, right opercular region, posterior right frontal lobe  and right parietal lobe. Tiny small acute infarcts involving the  posterior left temporal lobe and left parietal - occipital  junction. Tiny acute infarcts left cerebellum and right cerebellar  tonsil.  This distribution of infarcts raises possibility of embolic  disease. As the patient has findings of diskitis of the cervical  spine as discussed below, septic emboli not entirely excluded.  Opacification right mastoid air cells and right middle ear as noted  Above.  MRA head 12/27 Branch vessel atherosclerotic type changes as noted above.  MRI Cervical Spine 12/27 C6-7 diskitis with spread of infection to the adjacent facet  joints, spinous process and interspinous ligaments/posterior  paraspinal musculature as discussed above. Epidural abscess  suspected although evaluation limited without contrast. This  contributes to spinal stenosis most prominent C6 level.  Cervical spondylotic changes most notable C3-4 level.  2D TTE 09/17/2012 - Mitral valve: There was a large, cystic (hypoechoic interior) mass on the lateral aspect of the annulus; the abnormality is new since the previous study; the appearance is consistent with vegetation and/or abscess. Moderate to severe regurgitation, with multiple jets directed eccentrically. - Pericardium, extracardiac: A small pericardial effusion was identified posterior to the heart. The fluid had no internal echoes.There was no evidence of hemodynamic compromise.   Plan: Truitt Cifarelli is a 66  y.o. year old male with acute encephalopathy secondary to sepsis from multiple sources.    Pericarditis - Cardiology on board, greatly appreciate recommendations - Improving, Likely due to endocarditis rather than prolonged infection - Likely Pericarditis causing Afib,  concern for purulence, afib currently rhythm controlled - TEE this morning  Acute Diastolic Heart failure - secondary to mitral regurgitation caused by large vegetation - Will likely need surgical intervention for MR, CVTS following along - appreciate recommendations.  - BNP 3458 - Lasix 40 mg IV BID - Strict I/O, daily weights - Positive 1.7 L today, KVO fluids  MSSA Bacteremia-  - Afebrile, Tachycardia and tachypnea improving, Plan to get new Blood cultures if he becomes febrile again.  - Blood Cx 12/23 shows pan sensitive MSSA, Repeat from 12/26 NGTD - ID on Board-   - Continue Ancef, likely will need 6-8 weeks, needs TEE  - Picc Line  - Reccomendations greatly appreciated - R wrist   - OR by Hand surgery on 12/26 for debridement- Cx with mod MSSA  - Hand surgery signed off- R MTP-   - No osseus changes on XR, Open ulcer  - Debridment and hardware removal on 12/26   - Ortho signed off - Continue to trend CBC, ESR- draw later today  - WBC down to 10.6  - ESR trending down 81 -> 75 -> 67  - Hgb stable around 10.1  Ischemic CVA most likely secondary to septic emboli along with discitis/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 - Greatly appreciate input.   PAD/Ischemic limb:  - Vascular on board- appreciate reccomendations - concern on admission that LE were ischemic. Workup performed by vascular in ED which was positive for PAD on ABI testing. No reported claudication by pt.  - No heparin currently due to cerebral emboli - Vascular plans to defer angio until acute issues resolved  Acute Kidney Injury.  - Dehydration vs infection induced vs CKD.  - CR 1.4 on admission 1.1 yesterday, pending today. Unsure of baseline.  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 EtOH on admission < 11  - on CIWA   Newly Dx DM II - A1C 6.5 on check  - Consider sensitive SSI if starts to develop hyperglycemia, Blood glucose fine for now.   FEN/GI:  - Carb modified diet  - KVO NS - Strict I/O  Prophylaxis: SCD Disposition: Step down for now, Home with home health pending further workup and improvement  Kenn File, MD 09/18/2012, 8:27 AM

## 2012-09-18 NOTE — Progress Notes (Signed)
FMTS Attending Note Patient seen and examined by me, discussed with resident team and I agree with intern progress note for today.  Additional workup for newly identified abdominal tenderness with lactate and abd films to start. Serial exams.  Dalbert Mayotte, MD

## 2012-09-18 NOTE — Progress Notes (Signed)
[facility] Progress Note  09/18/2012 7:41 AM HD 6  Subjective:  Just back in bed  Afebrile x 24 hrs   Filed Vitals:   09/18/12 0340  BP: 105/57  Pulse: 77  Temp: 97.9 F (36.6 C)  Resp: 21    Physical Exam: Extremities:  Right foot continues to be cool to touch, but more equal today.  CBC    Component Value Date/Time   WBC 10.6* 09/17/2012 1120   RBC 4.23 09/17/2012 1120   HGB 8.9* 09/17/2012 1120   HCT 27.3* 09/17/2012 1120   PLT 219 09/17/2012 1120   MCV 64.5* 09/17/2012 1120   MCH 21.0* 09/17/2012 1120   MCHC 32.6 09/17/2012 1120   RDW 15.2 09/17/2012 1120    BMET    Component Value Date/Time   NA 135 09/17/2012 1120   K 3.9 09/17/2012 1120   CL 104 09/17/2012 1120   CO2 19 09/17/2012 1120   GLUCOSE 105* 09/17/2012 1120   BUN 21 09/17/2012 1120   CREATININE 0.90 09/17/2012 1120   CALCIUM 8.2* 09/17/2012 1120   GFRNONAA 85* 09/17/2012 1120   GFRAA >90 09/17/2012 1120    INR    Component Value Date/Time   INR 1.25 09/13/2012 0847     Intake/Output Summary (Last 24 hours) at 09/18/12 0741 Last data filed at 09/18/12 0400  Gross per 24 hour  Intake   3405 ml  Output   3176 ml  Net    229 ml     Assessment/Plan:  66 y.o. male is s/p ortho surgery HD 6  -pt now in acute diastolic heart failure with increasing MR and possible vegetation/myocardial abcsess. -delay angio until acute issues are resolved.  Only recommend angio if he is not able to heal his wound on his foot.   Leontine Locket, PA-C [facility] [phone] 09/18/2012 7:41 AM

## 2012-09-18 NOTE — Progress Notes (Addendum)
INFECTIOUS DISEASE PROGRESS NOTE  ID: Ryan [name] is a 66 y.o. male with afib, PVD, chronic right foot wound with HW, presented with sepsis found to have MSSA bacteremia c/b MSSA right wrist septic arthritis s/p I x D, MSSA right foot wound s/p I x D & HW removal, C6-C7-T1 diskitis/osteo, and several small infarcts to right hemisphere concerning for embolic vs. 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 Also found to have ecoli uti.  Principal Problem:  *Bacteremia due to Staphylococcus Active Problems:  Fever  Peripheral vascular disease  UTI (urinary tract infection)  Sepsis with metabolic encephalopathy  Septic arthritis of wrist, right  Acute ischemic stroke  Acute pericarditis, unspecified  Atrial fibrillation  Acute diastolic heart failure  Acute combined systolic and diastolic heart failure  Bacterial endocarditis  Subjective:  Having occassional shortness of breath; feeling warm this evening and his heart beating fast  24hr event: underwent repeat TTE found MV veg, possible annular abscess, pericardial effusion either same or smaller, having episode of acute diastolic HF.   Abtx:  Day #7 Cefazolin Day #4 (12/24-27 piptazo)  Medications:  Scheduled:    . [facility] HOLD] amiodarone  200 mg Oral BID  . [facility] HOLD] aspirin  81 mg Oral Daily  . [facility] HOLD] atorvastatin  40 mg Oral q1800  . [MAR HOLD]  ceFAZolin (ANCEF) IV  2 g Intravenous Q8H  . [facility] HOLD] colchicine  0.6 mg Oral BID  . [facility] HOLD] folic acid  1 mg Oral Daily  . [facility] HOLD] furosemide  40 mg Intravenous BID  . [MAR HOLD] influenza  inactive virus vaccine  0.5 mL Intramuscular Tomorrow-1000  . [facility] HOLD] metoprolol tartrate  25 mg Oral Q6H  . [MAR HOLD] multivitamin with minerals  1 tablet Oral Daily  . [facility] HOLD] pneumococcal 23 valent vaccine  0.5 mL Intramuscular Tomorrow-1000  . [MAR HOLD] sodium chloride  3 mL Intravenous Q12H  .  [facility] HOLD] thiamine  100 mg Oral Daily    Objective: Vital signs in last 24 hours: Temp:  [97.4 F (36.3 C)-99.5 F (37.5 C)] 99.5 F (37.5 C) (12/30 1204) Pulse Rate:  [73-106] 98  (12/30 1336) Resp:  [15-26] 21  (12/30 1336) BP: (105-134)/(55-93) 121/66 mmHg (12/30 1336) SpO2:  [90 %-99 %] 98 % (12/30 1336)   Physical Exam  Constitutional: He is oriented to person, place, and time. He appears well-developed and well-nourished. No distress.  HENT:  Mouth/Throat: Oropharynx is clear and moist. No oropharyngeal exudate.  Cardiovascular :tachy, regular rhythm and normal heart sounds. Exam reveals no gallop and no friction rub.  No murmur heard.  Pulmonary/Chest: Effort normal and breath sounds normal. No respiratory distress. He has no wheezes.  Abdominal: Soft. Bowel sounds are normal. He exhibits no distension. There is no tenderness.  Lymphadenopathy:  no cervical adenopathy.  Ext: right arm wrapped and elevated; right foot wrapped Skin: Skin is warm and dry. No rash noted. No erythema.     Lab Results  [facility] 09/17/12 1120 09/16/12 0520  WBC 10.6* 13.3*  HGB 8.9* 10.1*  HCT 27.3* 29.5*  NA 135 131*  K 3.9 4.0  CL 104 99  CO2 19 20  BUN 21 20  CREATININE 0.90 1.10  GLU -- --   Sedimentation Rate  Basename 09/16/12 0520  ESRSEDRATE 43*    Microbiology: 12/24 urine cx Ecoli (pan sensitive) 12/24 blood cx x 2 MSSA ( oxa S, pcn S) 12/24 right  wrist arthrocentesis cx MSSA( oxa S, pcn R) 12/25 right foot cx MSSA (oxa s, pcn S) 12/25 right wrist cx MSSa ( oxa S, pcn S) 12/26 blood cx x 1 NGTD  Studies/Results: Mri 12/27 brain:Several small areas of acute infarction involving the right  hemisphere including portions of the; right occipital lobe, right  temporal lobe, right opercular region, posterior right frontal lobe  and right parietal lobe. Tiny small acute infarcts involving the  posterior left temporal lobe and left parietal - occipital  junction. Tiny  acute infarcts left cerebellum and right cerebellar  tonsil.  This distribution of infarcts raises possibility of embolic  disease. As the patient has findings of diskitis of the cervical  spine as discussed below, septic emboli not entirely excluded.  Opacification right mastoid air cells and right middle ear as noted above.  Mri 12/27 c spine: Abnormal appearance of the C6-7 vertebra and disc space highly  suspicious for a diskitis/osteomyelitis with abnormal appearance of  the C6-7 and C7-T1 facet joints extending to involve the left T1  pedicle suspicious for involvement by infection. Epidural abscess  suspected although evaluation limited by motion and the fact that  no contrast was administered. This contributes to spinal stenosis  and mild cord flattening (most notable C6 level). Extension of  infectious process into the neural foramen on the right at the C5-6  and C6 level and on the left at the C5-6 through C7-T1 level.  Abnormal signal interspinous region most notable C5-6 level with  extending from the C2 level to the C7 level. Findings suspicious  for spread of infection. There is also edema within paraspinal  musculature throughout the posterior neck consistent with  cellulitis.  Slight increased signal within the C6 and C7 spinous process  suspicious for involvement by by infection.  No definitive cervical cord signal abnormality noted on this motion  degraded exam.   Assessment/Plan: Ryan [name] is a 66 y.o. male with afib, PVD, chronic right foot wound with HW, presented with sepsis found to have MSSA bacteremia c/b MSSA right wrist septic arthritis s/p I x D, MSSA right foot wound s/p I x D & HW removal, C6-C7-T1 diskitis/osteo, and several small infarcts to right hemisphere concerning for embolic vs. Septic emboli phenomenon.on TTE found to have small/mod pericardial effusion and MV vegetation with possible abscess:  1) MSSA  MV native valve endocarditis: continue  with cefazolin 2gm IV Q 8hr, agree with Dr. Tamala Julian, patient would benefit from CT surgery consultation for valve surgery since he has valvular abscess, diastolic heart failure despite treatment. Awaiting TEE results.  Spoke with Dr. Cyndia Bent this evening who will review images and clinical picture to discuss surgery options  2) MSSA bacteremia with metastatic disease (cervical diskitis and right wrist septic arthritis and right foot wound) = continue with cefazolin 2gm IV q8hr for a minimum of 6-8 wks. He has cleared his bacteremia 12/26 blood cx. Appreciate neurosurgery input.  3) embolic infarct concerning for septic emboli/endocarditis = now thought to be due to endocarditis from MV veg.  4) pericardial effusion = appears stable or slightly smaller on repeat TTE. cardiology is guiding further need for drainage  5)  fever =repeat blood cultures  6) tachycardia= in the 120s over the last hour, could be due to fever, recommend PRN Iv metop if needed per primary team.  5) ecoli uti = resolved, treated         [place], [place] [phone] pager [phone] cell 09/18/2012, 1:43 PM  LOS: 6 days

## 2012-09-18 NOTE — Progress Notes (Signed)
  Echocardiogram Echocardiogram Transesophageal has been performed.  Ryan [name] 09/18/2012, 3:06 PM

## 2012-09-18 NOTE — Progress Notes (Signed)
Peripherally Inserted Central Catheter/Midline Placement  The IV Nurse has discussed with the patient and/or persons authorized to consent for the patient, the purpose of this procedure and the potential benefits and risks involved with this procedure.  The benefits include less needle sticks, lab draws from the catheter and patient may be discharged home with the catheter.  Risks include, but not limited to, infection, bleeding, blood clot (thrombus formation), and puncture of an artery; nerve damage and irregular heat beat.  Alternatives to this procedure were also discussed.  PICC/Midline Placement Documentation        Ryan [name] 09/18/2012, 4:19 PM

## 2012-09-18 NOTE — Progress Notes (Signed)
09/18/12 1200  Subjective Assessment  Subjective "Is it looking OK?"  Patient and Family Stated Goals heal wound  Date of Onset 09/15/12  Prior Treatments I&D  Evaluation and Treatment  Evaluation and Treatment Procedures Explained to Patient/Family Yes  Evaluation and Treatment Procedures agreed to  Wound 09/16/12 Other (Comment) Wrist Right;Other (Comment) Right wrist  Date First Assessed/Time First Assessed: 09/16/12 1005   Wound Type: Other (Comment)  Location: Wrist  Location Orientation: Right;Other (Comment)  Wound Description (Comments): Right wrist  Present on Admission: No  Site / Wound Assessment Red;Clean;Other (Comment) (exposed tendons, fascia)  % Wound base Red or Granulating 40%  % Wound base Other (Comment) 60% (clean, white tendons/fascia)  Peri-wound Assessment Intact  Margins Unattacted edges (unapproximated)  Closure Sutures (2 black sutures (one proximal end of wound, one at distal))  Drainage Amount Minimal  Drainage Description Serosanguineous;No odor  Non-staged Wound Description Full thickness  Treatment Hydrotherapy (Pulse lavage);Packing (Plain strip)  Dressing Type Gauze (Comment);Compression wrap;Moist to moist;Other (Comment);Impregnated gauze (bismuth) (1/4" plain packing (2 pieces, total of 10 in); xeroform )  Dressing Changed Changed  Dressing Status Clean;Dry;Intact;Other (Comment) (includes splint, kerlix, ace wrap, mission sling)  Hydrotherapy  Pulsed Lavage with Suction (psi) 4 psi  Pulsed Lavage with Suction - Normal Saline Used 1000 mL  Pulsed Lavage Tip Tip with splash shield  Pulsed lavage therapy - wound location Rt dorsal wrist  Wound Therapy - Assess/Plan/Recommendations  Wound Therapy - Clinical Statement Hydrotherapy performed as ordered for every other day, and if on  wound continues to be free of necrotic tissue, recommend d/c hydrotherapy.  Wound Therapy - Functional Problem List Unable to use Rt hand due to splint, exposed  extensor tendons (await clarification from MD)  Factors Delaying/Impairing Wound Healing Infection - systemic/local;Immobility  Hydrotherapy Plan Debridement;Dressing change;Patient/family education;Pulsatile lavage with suction  Wound Therapy - Frequency 3X / week  Wound Therapy - Follow Up Recommendations Home health RN;Other (comment) (vs at MD office)  Wound Plan rec d/c hydrotherapy     Alben Deeds, PT DPT  [phone]

## 2012-09-18 NOTE — H&P (View-Only) (Signed)
Patient Name: Ryan [name] Date of Encounter: 09/18/2012    SUBJECTIVE: Just getting back in bed after is sitting some during the night. Very mild pleuritic chest discomfort with deep breathing. Breathing has improved some. He is still having waves of dyspnea. No neurological complaints.  TELEMETRY:  Normal sinus rhythm.: Filed Vitals:   09/17/12 1758 09/17/12 2059 09/18/12 0031 09/18/12 0340  BP: 116/62 108/55 105/56 105/57  Pulse: 84 85 73 77  Temp:  97.4 F (36.3 C) 97.6 F (36.4 C) 97.9 F (36.6 C)  TempSrc:  Oral Oral Oral  Resp:  19 20 21   Height:      Weight:      SpO2:  96% 99% 99%    Intake/Output Summary (Last 24 hours) at 09/18/12 0812 Last data filed at 09/18/12 0400  Gross per 24 hour  Intake   3280 ml  Output   3176 ml  Net    104 ml    LABS: Basic Metabolic Panel:  Basename 09/17/12 1120 09/16/12 0520  NA 135 131*  K 3.9 4.0  CL 104 99  CO2 19 20  GLUCOSE 105* 100*  BUN 21 20  CREATININE 0.90 1.10  CALCIUM 8.2* 8.5  MG -- --  PHOS -- --   CBC:  Basename 09/17/12 1120 09/16/12 0520  WBC 10.6* 13.3*  NEUTROABS -- --  HGB 8.9* 10.1*  HCT 27.3* 29.5*  MCV 64.5* 65.3*  PLT 219 210   Cardiac Enzymes:  Basename 09/16/12 2117 09/16/12 1411 09/16/12 1150  CKTOTAL -- -- --  CKMB -- -- --  CKMBINDEX -- -- --  TROPONINI 0.83* 0.86* 0.98*   Fasting Lipid Panel:  Basename 09/16/12 0520  CHOL 121  HDL 6*  LDLCALC 80  TRIG 174*  CHOLHDL 20.2  LDLDIRECT --    Radiology/Studies:  No new  Physical Exam: Blood pressure 105/57, pulse 77, temperature 97.9 F (36.6 C), temperature source Oral, resp. rate 21, height 6' (1.829 m), weight 84.4 kg (186 lb 1.1 oz), SpO2 99.00%. Weight change:    Decreased breath sounds at both bases. Faint mid lung rales are heard.  Cardiac exam reveals a faint pericardial friction rub and a 2-3 of 6 holosystolic MR murmur left lower sternal border and at the apex.  There is no peripheral  edema.  ASSESSMENT:  1. 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 Acute diastolic heart failure secondary to mitral regurgitation  3. Atrial fibrillation has not recurred.  4. Pericarditis is improving and is likely related to contiguous inflammation related to endocarditis, rather than a prolonged infection. Pericardial effusion size has diminished based upon yesterday's echo.  Plan:  1. Transesophageal echo today per Dr. Marlou Porch  2. Minimize IV fluid administration  3. Diuresis  4. N.p.o.  5. TCTS to follow with [place], Dr. Roxan Hockey  Prolonged services required to arrange w/u and management  Signed, [name] 09/18/2012, 8:12 AM

## 2012-09-19 ENCOUNTER — Encounter (HOSPITAL_COMMUNITY): Payer: Self-pay | Admitting: Cardiology

## 2012-09-19 DIAGNOSIS — I330 Acute and subacute infective endocarditis: Secondary | ICD-10-CM

## 2012-09-19 DIAGNOSIS — Z953 Presence of xenogenic heart valve: Secondary | ICD-10-CM | POA: Diagnosis not present

## 2012-09-19 DIAGNOSIS — I059 Rheumatic mitral valve disease, unspecified: Secondary | ICD-10-CM

## 2012-09-19 LAB — SEDIMENTATION RATE: Sed Rate: 66 mm/hr — ABNORMAL HIGH (ref 0–16)

## 2012-09-19 LAB — BASIC METABOLIC PANEL
BUN: 22 mg/dL (ref 6–23)
CO2: 25 mEq/L (ref 19–32)
Calcium: 8.4 mg/dL (ref 8.4–10.5)
Chloride: 104 mEq/L (ref 96–112)
Creatinine, Ser: 1.05 mg/dL (ref 0.50–1.35)
GFR calc Af Amer: 82 mL/min — ABNORMAL LOW (ref >90)
GFR calc non Af Amer: 71 mL/min — ABNORMAL LOW (ref >90)
Glucose, Bld: 106 mg/dL — ABNORMAL HIGH (ref 70–99)
Potassium: 3.7 mEq/L (ref 3.5–5.1)
Sodium: 137 mEq/L (ref 135–145)

## 2012-09-19 LAB — CBC
HCT: 28.3 % — ABNORMAL LOW (ref 39.0–52.0)
Hemoglobin: 9.8 g/dL — ABNORMAL LOW (ref 13.0–17.0)
MCH: 22.1 pg — ABNORMAL LOW (ref 26.0–34.0)
MCHC: 34.6 g/dL (ref 30.0–36.0)
MCV: 63.9 fL — ABNORMAL LOW (ref 78.0–100.0)
Platelets: 441 10*3/uL — ABNORMAL HIGH (ref 150–400)
RBC: 4.43 MIL/uL (ref 4.22–5.81)
RDW: 15 % (ref 11.5–15.5)
WBC: 13.7 10*3/uL — ABNORMAL HIGH (ref 4.0–10.5)

## 2012-09-19 LAB — ANAEROBIC CULTURE

## 2012-09-19 NOTE — Progress Notes (Signed)
I was called urgently to the patient's bedside because of "acute MI" reading on the EKG. There is ST elevation there is a reflection of ongoing pericardial inflammation.  I had a long discussion with the patient and multiple family members including the wife. He has a huge vegetation/possible abscess on the mitral valve near the annulus. He has several jets of regurgitation. CHF symptoms have improved with IV therapy. He spiked a temperature to 102 last night despite one week of IV antibiotic therapy.  The patient will need to have mitral valve surgery for vegetation removal and repair of regurgitation or replacement of the valve. I have again contacted TCTS opinion and followup. I will continue to followup with the patient and team.

## 2012-09-19 NOTE — Progress Notes (Signed)
@   1900 hrs pt HR 130's sustained base line was 80;s-90's over the weekend., temp initially was 100.5  gave 650 tylenol Rechecked  Temp @ 2010 was 100.0 but HR was still sustained  130's. Spoke with Dr Wendi Snipes who orderd additional dose of lopressor will continue to monitor

## 2012-09-19 NOTE — Progress Notes (Signed)
INFECTIOUS DISEASE PROGRESS NOTE  ID: Ryan [name] is a 66 y.o. male with afib, PVD, chronic right foot wound with HW, presented with sepsis found to have MSSA bacteremia c/b MSSA right wrist septic arthritis s/p I x D, MSSA right foot wound s/p I x D & HW removal, C6-C7-T1 diskitis/osteo, and several small infarcts to right hemisphere concerning for embolic vs. 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 Also found to have ecoli uti.  Principal Problem:  *Bacteremia due to Staphylococcus Active Problems:  Fever  Peripheral vascular disease  UTI (urinary tract infection)  Sepsis with metabolic encephalopathy  Septic arthritis of wrist, right  Acute ischemic stroke  Acute pericarditis, unspecified  Atrial fibrillation  Acute diastolic heart failure  Acute combined systolic and diastolic heart failure  Bacterial endocarditis  Subjective:  Febrile and tachycardic last night, underwent repeat cultures. He also was seen by Dr. Cyndia Bent for eval for MVreplacement   Abtx:  Day #8 Cefazolin Day #5 (12/24-27 piptazo)  Medications:  Scheduled:    . amiodarone  200 mg Oral BID  . aspirin  81 mg Oral Daily  . atorvastatin  40 mg Oral q1800  .  ceFAZolin (ANCEF) IV  2 g Intravenous Q8H  . colchicine  0.6 mg Oral BID  . folic acid  1 mg Oral Daily  . furosemide  40 mg Intravenous BID  . metoprolol tartrate  25 mg Oral Q6H  . multivitamin with minerals  1 tablet Oral Daily  . sodium chloride  10-40 mL Intracatheter Q12H  . sodium chloride  3 mL Intravenous Q12H  . thiamine  100 mg Oral Daily    Objective: Vital signs in last 24 hours: Temp:  [97.9 F (36.6 C)-100.5 F (38.1 C)] 99.6 F (37.6 C) (12/31 1200) Pulse Rate:  [68-133] 86  (12/31 1200) Resp:  [15-26] 18  (12/31 1200) BP: (103-148)/(51-69) 128/66 mmHg (12/31 1200) SpO2:  [93 %-98 %] 93 % (12/31 1200)   Physical Exam  Constitutional: He is  oriented to person, place, and time. He appears well-developed and well-nourished. No distress.  HENT:  Mouth/Throat: Oropharynx is clear and moist. No oropharyngeal exudate.  Cardiovascular :tachy, regular rhythm and normal heart sounds. Exam reveals no gallop and no friction rub.  No murmur heard.  Pulmonary/Chest: Effort normal and breath sounds normal. No respiratory distress. He has no wheezes.  Abdominal: Soft. Bowel sounds are normal. He exhibits no distension. There is no tenderness.  Lymphadenopathy:  no cervical adenopathy.  Ext: right arm wrapped and elevated; right foot wrapped Skin: Skin is warm and dry. No rash noted. No erythema.     Lab Results  Basename 09/19/12 0500 09/18/12 1500 09/17/12 1120  WBC 13.7* 11.0* --  HGB 9.8* 9.8* --  HCT 28.3* 29.0* --  NA 137 -- 135  K 3.7 -- 3.9  CL 104 -- 104  CO2 25 -- 19  BUN 22 -- 21  CREATININE 1.05 -- 0.90  GLU -- -- --   Sedimentation Rate  Basename 09/19/12 0500  ESRSEDRATE 43*    Microbiology: 12/24 urine cx Ecoli (pan sensitive) 12/24 blood cx x 2 MSSA ( oxa S, pcn S) 12/24 right wrist arthrocentesis cx MSSA( oxa S, pcn R) 12/25 right foot cx MSSA (oxa s, pcn S) 12/25 right wrist cx MSSa ( oxa S, pcn S) 12/26 blood cx x 1 NGTD 12/30 blood cx x 2 PENDING  Studies/Results: Mri 12/27 brain:Several small areas of  acute infarction involving the right  hemisphere including portions of the; right occipital lobe, right  temporal lobe, right opercular region, posterior right frontal lobe  and right parietal lobe. Tiny small acute infarcts involving the  posterior left temporal lobe and left parietal - occipital  junction. Tiny acute infarcts left cerebellum and right cerebellar  tonsil.  This distribution of infarcts raises possibility of embolic  disease. As the patient has findings of diskitis of the cervical  spine as discussed below, septic emboli not entirely excluded.  Opacification right mastoid air cells  and right middle ear as noted above.  Mri 12/27 c spine: Abnormal appearance of the C6-7 vertebra and disc space highly  suspicious for a diskitis/osteomyelitis with abnormal appearance of  the C6-7 and C7-T1 facet joints extending to involve the left T1  pedicle suspicious for involvement by infection. Epidural abscess  suspected although evaluation limited by motion and the fact that  no contrast was administered. This contributes to spinal stenosis  and mild cord flattening (most notable C6 level). Extension of  infectious process into the neural foramen on the right at the C5-6  and C6 level and on the left at the C5-6 through C7-T1 level.  Abnormal signal interspinous region most notable C5-6 level with  extending from the C2 level to the C7 level. Findings suspicious  for spread of infection. There is also edema within paraspinal  musculature throughout the posterior neck consistent with  cellulitis.  Slight increased signal within the C6 and C7 spinous process  suspicious for involvement by by infection.  No definitive cervical cord signal abnormality noted on this motion  degraded exam.   Assessment/Plan: Ryan [name] is a 66 y.o. male with afib, PVD, chronic right foot wound with HW, presented with sepsis found to have MSSA bacteremia c/b MSSA right wrist septic arthritis s/p I x D, MSSA right foot wound s/p I x D & HW removal, C6-C7-T1 diskitis/osteo, and several small infarcts to right hemisphere concerning for embolic vs. Septic emboli phenomenon.on TTE found to have small/mod pericardial effusion and MV vegetation with possible abscess:  1) MSSA  MV native valve endocarditis: continue with cefazolin 2gm IV Q 8hr, agree with Dr. Tamala Julian and Cyndia Bent, patient would benefit from mitral valve surgery since he has valvular abscess.  2) MSSA bacteremia with metastatic disease (cervical diskitis and right wrist septic arthritis and right foot wound) = continue with cefazolin 2gm IV q8hr  for a minimum of 6-8 wks. He has cleared his bacteremia 12/26 blood cx. Appreciate neurosurgery input.  3) embolic infarct concerning for septic emboli/endocarditis = now thought to be due to endocarditis from MV veg.  4) pericardial effusion = appears stable or slightly smaller on repeat TTE. cardiology is guiding further need for drainage  5)  fever =will follow up on blood cultures, continue to repeat blood cultures if temp > 100.3  6)ecoli uti = resolved, treated         [place], [place] [phone] pager [phone] cell 09/19/2012, 3:08 PM   LOS: 7 days

## 2012-09-19 NOTE — Progress Notes (Signed)
Rehab Admissions Coordinator Note:  Patient was screened by Retta Diones for appropriateness for an Inpatient Acute Rehab Consult.  At this time, we are recommending [facility].  Noted PT recommending CIR.  Patient has [employer] and they are very unlikely to approve an acute inpatient rehab admission.  Call me for questions.  Retta Diones 09/19/2012, 3:46 PM  I can be reached at [phone].

## 2012-09-19 NOTE — Progress Notes (Signed)
Occupational Therapy Treatment Patient Details Name: Ryan [name] MRN: [id number] DOB: [birth year] Today's Date: 09/19/2012 Time: [id number] OT Time Calculation (min): 23 min  OT Assessment / Plan / Recommendation Comments on Treatment Session Pt progressing with therapy. Will continue to follow acutely and recommend CIR after potential surgery next week.     Follow Up Recommendations  CIR    Barriers to Discharge       Equipment Recommendations  3 in 1 bedside comode;Tub/shower bench    Recommendations for Other Services Rehab consult  Frequency     Plan Discharge plan remains appropriate    Precautions / Restrictions Precautions Precautions: Fall (Simultaneous filing. User may not have seen previous data.) Restrictions Weight Bearing Restrictions: Yes RUE Weight Bearing: Non weight bearing (Simultaneous filing. User may not have seen previous data.) RLE Weight Bearing: Weight bearing as tolerated (Simultaneous filing. User may not have seen previous data.)   Pertinent Vitals/Pain Pt with no c/o pain during session    ADL  Grooming: Wash/dry face;Set up Where Assessed - Grooming: Unsupported sitting Lower Body Dressing: Simulated;+2 Total assistance Lower Body Dressing: Patient Percentage: 30% Toilet Transfer: +2 Total assistance Toilet Transfer: Patient Percentage: 50% Toilet Transfer Method: Stand pivot Toilet Transfer Equipment:  (bed to chair) Toileting - Clothing Manipulation and Hygiene: +2 Total assistance Toileting - Clothing Manipulation and Hygiene: Patient Percentage: 30% Where Assessed - Toileting Clothing Manipulation and Hygiene: Sit to stand from 3-in-1 or toilet Equipment Used: Gait belt Transfers/Ambulation Related to ADLs: Pt required total assist +2 (pt 50%) for sit to stand and stand pivot to bedside chair. ADL Comments: RUE in mission sling. Unable to place pole beside chair comfortably, therefore, placed arm in sling with arms propped up in pillows  at a 80-90 degress angle. Sling then secured to bed next to pt. Pt reports being comfortable and knows to call RN if assist is needed in changing position     OT Diagnosis:    OT Problem List:   OT Treatment Interventions:     OT Goals ADL Goals Pt Will Perform Upper Body Bathing: with min assist;Unsupported;Sitting, edge of bed Pt Will Perform Lower Body Bathing: with mod assist;Sit to stand from bed;with adaptive equipment ADL Goal: Lower Body Bathing - Progress: Progressing toward goals Pt Will Perform Upper Body Dressing: with supervision;Unsupported;Sitting, bed Pt Will Perform Lower Body Dressing: with mod assist;Unsupported;Sit to stand from bed ADL Goal: Lower Body Dressing - Progress: Progressing toward goals Pt Will Transfer to Toilet: with mod assist;Stand pivot transfer;3-in-1;with DME ADL Goal: Toilet Transfer - Progress: Progressing toward goals Pt Will Perform Toileting - Clothing Manipulation: with mod assist;Sitting on 3-in-1 or toilet;Standing ADL Goal: Toileting - Clothing Manipulation - Progress: Progressing toward goals Pt Will Perform Toileting - Hygiene: with mod assist;Sit to stand from 3-in-1/toilet ADL Goal: Toileting - Hygiene - Progress: Progressing toward goals  Visit Information  Last OT Received On: 09/19/12 Assistance Needed: +2    Subjective Data      Prior [place] Lives With: Spouse Available Help at Discharge: Available PRN/intermittently;Other (Comment) Type of Home: House Home Access: Stairs to enter [employer] of Steps: 2 Entrance Stairs-Rails: None Home Layout: One level Bathroom Shower/Tub: Multimedia programmer: Standard Bathroom Accessibility: Yes Home Adaptive Equipment: Environmental consultant - standard Additional Comments: walker was his mother-in-law Prior Function Level of Independence: Independent Able to Take Stairs?: Yes Driving: Yes Vocation: Part time employment Comments: worked part-time as a  Surveyor, 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  Max cues for proper technique Transfers Sit to Stand: 1: +2 Total assist;Without upper extremity assist;Other (comment) Sit to Stand: Patient Percentage: 40% Stand to Sit: 1: +2 Total assist;To chair/3-in-1 Stand to Sit: Patient Percentage: 40% Details for Transfer Assistance: +2 (A) to initiate transfer and to slowly descend with max cues for proper technique.  Pt needed manual (A) to advance hips and LE to recliner during stand pivot       Exercises  Other Exercises Other Exercises: encouarged wiggling of RUE digits as well as gentle passive ROM by wife.    Balance Balance Balance Assessed: Yes Static Sitting Balance Static Sitting - Balance Support: Feet supported Static Sitting - Level of Assistance: 4: Min assist;5: Stand by assistance Static Sitting - Comment/# of Minutes: Initial min (A) to maintain balance with cues for upright posture.   End of Session OT - End of Session Equipment Utilized During Treatment: Gait belt Activity Tolerance: Patient tolerated treatment well Patient left: in chair;with call bell/phone within reach;with family/visitor present Nurse Communication: Mobility status  GO     [name] 09/19/2012, 3:49 PM

## 2012-09-19 NOTE — Progress Notes (Signed)
Patient Name: Ryan [name] Date of Encounter: 09/19/2012    SUBJECTIVE: Had long discussion this AM with the patient, wife and multiple other family members. He is less short of breath. He felt terrible last night when he spiked a fever. He denies chest pain. On my earlier note today there was concern about the ECG report of acute MI. This is related to ST elevation from persistent pericardial inflammation due to myocardial abscess.  TELEMETRY:  NSR: Filed Vitals:   09/19/12 0330 09/19/12 0800 09/19/12 1200 09/19/12 1609  BP:  148/60 128/66   Pulse: 69 74 86   Temp:  97.9 F (36.6 C) 99.6 F (37.6 C) 97.9 F (36.6 C)  TempSrc:  Oral Oral Oral  Resp:  21 18   Height:      Weight:      SpO2:  96% 93%     Intake/Output Summary (Last 24 hours) at 09/19/12 1629 Last data filed at 09/19/12 1300  Gross per 24 hour  Intake    544 ml  Output   1400 ml  Net   -856 ml    LABS: Basic Metabolic Panel:  Basename 09/19/12 0500 09/17/12 1120  NA 137 135  K 3.7 3.9  CL 104 104  CO2 25 19  GLUCOSE 106* 105*  BUN 22 21  CREATININE 1.05 0.90  CALCIUM 8.4 8.2*  MG -- --  PHOS -- --   CBC:  Basename 09/19/12 0500 09/18/12 1500  WBC 13.7* 11.0*  NEUTROABS -- --  HGB 9.8* 9.8*  HCT 28.3* 29.0*  MCV 63.9* 63.9*  PLT 441* PLATELET CLUMPS NOTED ON SMEAR, COUNT APPEARS ADEQUATE   Cardiac Enzymes:  Basename 09/16/12 2117  CKTOTAL --  CKMB --  CKMBINDEX --  TROPONINI 0.83*   Radiology/Studies:  No new. ECG: ST elevation is diffuse but less severe than several days ago.  Physical Exam: Blood pressure 128/66, pulse 86, temperature 97.9 F (36.6 C), temperature source Oral, resp. rate 18, height 6' (1.829 m), weight 84.4 kg (186 lb 1.1 oz), SpO2 93.00%. Weight change:    Soft pericardial rub. 3/6 holosystolic murmur of MR. No gallop.  Decreased breath sounds at the bases.   Neurological status is stable.  ASSESSMENT:  1. Mitral valve endocarditis with large {2.5 x 2.5  cm) vegetation and probable myocardial abscess and associated pericarditis.  2. Acute on chronic DHF, better after diuresis  3. Multifocal emboli including GI, brain, spine, ? Other.  4. Staph aureus bacteremia with recurrent fever last PM  Plan:  1. Will plan Left and Right heart cath cor angio for thursday AM. No v-gram due to vegetation. 2. Watch for evidence of further embolization.  Demetrios Isaacs 09/19/2012, 4:29 PM

## 2012-09-19 NOTE — Evaluation (Signed)
Physical Therapy Evaluation Patient Details Name: Ryan [name] MRN: [id number] DOB: 15-Oct-1945 Today's Date: 09/19/2012 Time: [id number] PT Time Calculation (min): 24 min  PT Assessment / Plan / Recommendation Clinical Impression  Pt is 66 y.o. male with afib, PVD, chronic right foot wound with HW, presented with sepsis found to have MSSA bacteremia c/b MSSA right wrist septic arthritis s/p I x D, MSSA right foot wound s/p I x D & HW removal, C6-C7-T1 diskitis/osteo, and several small infarcts to right hemisphere concerning for embolic vs. 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 Also found to have ecoli uti.  Pt may need Mitral Valve replacement due to infection.  Pt will benefit from acute PT services due to impairments listed below.  Highly recommend inpatient rehab to improve overall mobility and strength.    PT Assessment  Patient needs continued PT services    Follow Up Recommendations  CIR    Barriers to Discharge None      Equipment Recommendations  Rolling walker with 5" wheels    Recommendations for Other Services Rehab consult   Frequency Min 3X/week    Precautions / Restrictions Precautions Precautions: Fall (Simultaneous filing. User may not have seen previous data.) Restrictions Weight Bearing Restrictions: Yes RUE Weight Bearing: Non weight bearing (Simultaneous filing. User may not have seen previous data.) RLE Weight Bearing: Weight bearing as tolerated (Simultaneous filing. User may not have seen previous data.)   Pertinent Vitals/Pain No c/o pain      Mobility  Bed Mobility Bed Mobility: Supine to Sit Supine to Sit: 1: +2 Total assist Supine to Sit: Patient Percentage: 30% Details for Bed Mobility Assistance: +2 (A) to elevate trunk OOB and LE OOB.  Max cues for proper technique Transfers Transfers: Sit to Stand;Stand to Sit;Stand Pivot Transfers Sit to Stand: 1: +2 Total  assist;Without upper extremity assist;Other (comment) Sit to Stand: Patient Percentage: 40% Stand to Sit: 1: +2 Total assist;To chair/3-in-1 Stand to Sit: Patient Percentage: 40% Stand Pivot Transfers: 1: +2 Total assist Stand Pivot Transfers: Patient Percentage: 50% Details for Transfer Assistance: +2 (A) to initiate transfer and to slowly descend with max cues for proper technique.  Pt needed manual (A) to advance hips and LE to recliner during stand pivot Ambulation/Gait Ambulation/Gait Assistance: Not tested (comment) Wheelchair Mobility Wheelchair Mobility: No    Shoulder Instructions     Exercises     PT Diagnosis: Difficulty walking;Generalized weakness  PT Problem List: Decreased strength;Decreased activity tolerance;Decreased balance;Decreased mobility;Decreased knowledge of use of DME PT Treatment Interventions: DME instruction;Gait training;Functional mobility training;Therapeutic activities;Therapeutic exercise;Balance training;Patient/family education   PT Goals Acute Rehab PT Goals PT Goal Formulation: With patient/family Time For Goal Achievement: 10/03/12 Potential to Achieve Goals: Good Pt will go Supine/Side to Sit: with min assist PT Goal: Supine/Side to Sit - Progress: Goal set today Pt will go Sit to Supine/Side: with min assist PT Goal: Sit to Supine/Side - Progress: Goal set today Pt will go Sit to Stand: with min assist PT Goal: Sit to Stand - Progress: Goal set today Pt will go Stand to Sit: with min assist PT Goal: Stand to Sit - Progress: Goal set today Pt will Transfer Bed to Chair/Chair to Bed: with min assist PT Transfer Goal: Bed to Chair/Chair to Bed - Progress: Goal set today Pt will Stand: with min assist;1 - 2 min PT Goal: Stand - Progress: Goal set today Pt will Ambulate: 1 - 15 feet;with +2 total assist;with rolling  walker PT Goal: Ambulate - Progress: Goal set today  Visit Information  Last PT Received On: 09/19/12 Assistance Needed:  +2 PT/OT Co-Evaluation/Treatment: Yes    Subjective Data  Subjective: "I would like to get up." Patient Stated Goal: To eventually go home   Prior Tice Lives With: Spouse Available Help at Discharge: Available PRN/intermittently;Other (Comment) Type of Home: House Home Access: Stairs to enter [employer] of Steps: 2 Entrance Stairs-Rails: None Home Layout: One level Bathroom Shower/Tub: Multimedia programmer: Standard Bathroom Accessibility: Yes Home Adaptive Equipment: Environmental consultant - standard Additional Comments: walker was his mother-in-law Prior Function Level of Independence: Independent Able to Take Stairs?: Yes Driving: Yes Vocation: Part time employment Comments: worked part-time as a Surveyor, 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) Communication Communication: No difficulties Dominant Hand: Right    Cognition  Overall Cognitive Status: Appears within functional limits for tasks assessed/performed Orientation Level: Appears intact for tasks assessed Behavior During Session: [facility] for tasks performed    Extremity/Trunk Assessment Right Lower Extremity Assessment RLE ROM/Strength/Tone: Deficits RLE ROM/Strength/Tone Deficits: 3/5 quad, 3/5 hamstring Left Lower Extremity Assessment LLE ROM/Strength/Tone: Deficits LLE ROM/Strength/Tone Deficits: 4+/5 quad, 4/5 hamstring   Balance Balance Balance Assessed: Yes Static Sitting Balance Static Sitting - Balance Support: Feet supported Static Sitting - Level of Assistance: 4: Min assist;5: Stand by assistance Static Sitting - Comment/# of Minutes: Initial min (A) to maintain balance with cues for upright posture.  End of Session PT - End of Session Equipment Utilized During Treatment: Gait belt;Oxygen (2L) Activity Tolerance: Patient limited by fatigue Patient left: in chair;with call bell/phone within reach;with family/visitor present Nurse Communication: Mobility status  GP      Castulo Scarpelli 09/19/2012, 3:40 PM Antoine Poche, [place] DPT [phone]

## 2012-09-19 NOTE — Progress Notes (Signed)
FMTS Attending Note Patient seen and examined by me, discussed with resident team and I agree with their assess/plan.  Patient with 2.5cm mitral valvular vegetation; for CTS assessment. Dalbert Mayotte, MD

## 2012-09-19 NOTE — Progress Notes (Signed)
Morning 12-lead EKG done and shows an acute STEMI. Primary MD notified and stated MD would come see patient. Cardiology (Dr. Tamala Julian) notified and MD states that he is on his way right now.  Patient showing no signs of distress and VVS.  Will continue to monitor.

## 2012-09-19 NOTE — Progress Notes (Signed)
Subjective: 1 Day Post-Op Procedure(s) (LRB): TRANSESOPHAGEAL ECHOCARDIOGRAM (TEE) (N/A) Patient reports pain as mild in wrist.  Dressing rewrapped last night because it got tight.    Objective: Vital signs in last 24 hours: Temp:  [97.9 F (36.6 C)-100.5 F (38.1 C)] 97.9 F (36.6 C) (12/31 1609) Pulse Rate:  [68-133] 86  (12/31 1200) Resp:  [15-26] 18  (12/31 1200) BP: (103-148)/(51-69) 128/66 mmHg (12/31 1200) SpO2:  [93 %-98 %] 93 % (12/31 1200)  Intake/Output from previous day: 12/30 0701 - 12/31 0700 In: 190 [P.O.:180; I.V.:10] Out: 700 [Urine:700] Intake/Output this shift: Total I/O In: 354 [P.O.:350; IV Piggyback:4] Out: 700 [Urine:700]   Basename 09/19/12 0500 09/18/12 1500 09/17/12 1120  HGB 9.8* 9.8* 8.9*    Basename 09/19/12 0500 09/18/12 1500  WBC 13.7* 11.0*  RBC 4.43 4.54  HCT 28.3* 29.0*  PLT 441* PLATELET CLUMPS NOTED ON SMEAR, COUNT APPEARS ADEQUATE    Basename 09/19/12 0500 09/17/12 1120  NA 137 135  K 3.7 3.9  CL 104 104  CO2 25 19  BUN 22 21  CREATININE 1.05 0.90  GLUCOSE 106* 105*  CALCIUM 8.4 8.2*   No results found for this basename: LABPT:2,INR:2 in the last 72 hours  intact sensation and capillary refill all digits.  wiggles fingers.  dressing c/d/i.  Assessment/Plan: 1 Day Post-Op Procedure(s) (LRB): TRANSESOPHAGEAL ECHOCARDIOGRAM (TEE) (N/A) Continue hydrotherapy.  ABx per ID.  Ryan [name] R 09/19/2012, 4:37 PM

## 2012-09-19 NOTE — Consult Note (Signed)
[address]            [place] [address]          [phone]      Reason for Consult: MSSA mitral valve endocarditis Referring Physician:  Dr. Daneen Schick  Ryan [name] is an 66 y.o. male.  HPI:   The patient is a 66 year old gentleman with a history of atrial fibrillation, peripheral  vascular disease, and a chronic right foot wound with hardware in place who presented with mental status changes as well as pain in his neck and right wrist and difficulty using his right leg. He was initially diagnosed with pyelonephritis with possible urosepsis. He has subsequently been diagnosed with MSSA bacteremia with right wrist septic arthritis and underwent incision and drainage of the right wrist. He was also diagnosed with MSSA in his right foot wound and underwent incision and debridement with hardware removal. He has been found to have cervical discitis or osteomyelitis at C6-C7-T1. MRI of the brain showed several small acute infarcts in the right hemisphere concerning for embolic strokes. A transthoracic echocardiogram showed a small to moderate-sized pericardial effusion and possible mitral valve endocarditis. He underwent a TEE yesterday which showed a 2.5 cm vegetation on the anterior leaflet of the mitral valve near the annulus. There was a lucent area within the vegetation suggesting a possible abscess and there was moderate mitral regurgitation with 2 jets through the area of vegetation suggesting perforation of the anterior leaflet. There is no evidence of vegetation or involvement of the aortic valve.   Past Medical History  Diagnosis Date  . Hypertension     Past Surgical History  Procedure Date  . I&d extremity 09/14/2012    Procedure: IRRIGATION AND DEBRIDEMENT EXTREMITY;  Surgeon: Tennis Must, MD;  Location: [facility];  Service: Orthopedics;  Laterality: Right;  . Extremity wire/pin removal 09/14/2012    Procedure: REMOVAL K-WIRE/PIN EXTREMITY;   Surgeon: [name], MD;  Location: [facility];  Service: Orthopedics;  Laterality: Right;  Right Foot  . Tee without cardioversion 09/18/2012    Procedure: TRANSESOPHAGEAL ECHOCARDIOGRAM (TEE);  Surgeon: [name], MD;  Location[facility] ENDOSCOPY;  Service: Cardiovascular;  Laterality: N/A;    History reviewed. No pertinent family history.  Social History:  reports that he has never smoked. He does not have any smokeless tobacco history on file. He reports that he does not drink alcohol. His drug history not on file.  Allergies: No Known Allergies  Medications:  I have reviewed the patient's current medications. Prior to Admission:  Prescriptions prior to admission  Medication Sig Dispense Refill  . valsartan (DIOVAN) 80 MG tablet Take 80 mg by mouth daily.       Scheduled:   . amiodarone  200 mg Oral BID  . aspirin  81 mg Oral Daily  . atorvastatin  40 mg Oral q1800  .  ceFAZolin (ANCEF) IV  2 g Intravenous Q8H  . colchicine  0.6 mg Oral BID  . folic acid  1 mg Oral Daily  . furosemide  40 mg Intravenous BID  . metoprolol tartrate  25 mg Oral Q6H  . multivitamin with minerals  1 tablet Oral Daily  . sodium chloride  10-40 mL Intracatheter Q12H  . sodium chloride  3 mL Intravenous Q12H  . thiamine  100 mg Oral Daily   Continuous:  [id number], acetaminophen, HYDROcodone-acetaminophen, morphine injection,  ondansetron (ZOFRAN) IV, ondansetron, polyethylene glycol, sodium chloride Anti-infectives     Start     Dose/Rate Route Frequency Ordered Stop   09/16/12 0800   fluconazole (DIFLUCAN) tablet 150 mg        150 mg Oral  Once 09/16/12 0619 09/16/12 1204   09/15/12 1400   ceFAZolin (ANCEF) IVPB 2 g/50 mL premix        2 g 100 mL/hr over 30 Minutes Intravenous 3 times per day 09/15/12 1110     09/13/12 1200   vancomycin (VANCOCIN) 750 mg in sodium chloride 0.9 % 150 mL IVPB  Status:  Discontinued        750 mg 150 mL/hr over 60 Minutes Intravenous Every 12 hours 09/13/12  0409 09/15/12 1110   09/13/12 0800   piperacillin-tazobactam (ZOSYN) IVPB 3.375 g  Status:  Discontinued        3.375 g 12.5 mL/hr over 240 Minutes Intravenous Every 8 hours 09/13/12 0409 09/15/12 1110   09/12/12 2300   vancomycin (VANCOCIN) IVPB 1000 mg/200 mL premix        1,000 mg 200 mL/hr over 60 Minutes Intravenous  Once 09/12/12 2253 09/13/12 0128   09/12/12 2300  piperacillin-tazobactam (ZOSYN) IVPB 3.375 g       3.375 g 12.5 mL/hr over 240 Minutes Intravenous  Once 09/12/12 2253 09/13/12 0023          Results for orders placed during the hospital encounter of 09/12/12 (from the past 48 hour(s))  LACTIC ACID, PLASMA     Status: Normal   Collection Time   09/18/12  2:30 PM      Component Value Range Comment   Lactic Acid, Venous 0.8  0.5 - 2.2 mmol/L   SEDIMENTATION RATE     Status: Abnormal   Collection Time   09/18/12  3:00 PM      Component Value Range Comment   Sed Rate 38 (*) 0 - 16 mm/hr   CBC     Status: Abnormal   Collection Time   09/18/12  3:00 PM      Component Value Range Comment   WBC 11.0 (*) 4.0 - 10.5 K/uL    RBC 4.54  4.22 - 5.81 MIL/uL    Hemoglobin 9.8 (*) 13.0 - 17.0 g/dL    HCT 29.0 (*) 39.0 - 52.0 %    MCV 63.9 (*) 78.0 - 100.0 fL    MCH 21.6 (*) 26.0 - 34.0 pg    MCHC 33.8  30.0 - 36.0 g/dL    RDW 15.1  11.5 - 15.5 %    Platelets    150 - 400 K/uL    Value: PLATELET CLUMPS NOTED ON SMEAR, COUNT APPEARS ADEQUATE  SEDIMENTATION RATE     Status: Abnormal   Collection Time   09/19/12  5:00 AM      Component Value Range Comment   Sed Rate 66 (*) 0 - 16 mm/hr   CBC     Status: Abnormal   Collection Time   09/19/12  5:00 AM      Component Value Range Comment   WBC 13.7 (*) 4.0 - 10.5 K/uL    RBC 4.43  4.22 - 5.81 MIL/uL    Hemoglobin 9.8 (*) 13.0 - 17.0 g/dL    HCT 28.3 (*) 39.0 - 52.0 %    MCV 63.9 (*) 78.0 - 100.0 fL    MCH 22.1 (*) 26.0 - 34.0 pg    MCHC 34.6  30.0 - 36.0 g/dL  RDW 15.0  11.5 - 15.5 %    Platelets 441 (*) 150 -  400 K/uL   BASIC METABOLIC PANEL     Status: Abnormal   Collection Time   09/19/12  5:00 AM      Component Value Range Comment   Sodium 137  135 - 145 mEq/L    Potassium 3.7  3.5 - 5.1 mEq/L    Chloride 104  96 - 112 mEq/L    CO2 25  19 - 32 mEq/L    Glucose, Bld 106 (*) 70 - 99 mg/dL    BUN 22  6 - 23 mg/dL    Creatinine, Ser 1.05  0.50 - 1.35 mg/dL    Calcium 8.4  8.4 - 10.5 mg/dL    GFR calc non Af Amer 71 (*) >90 mL/min    GFR calc Af Amer 82 (*) >90 mL/min     Dg Abd Portable 1v  09/18/2012  *RADIOLOGY REPORT*  Clinical Data: Intermittent abdominal pain.  PORTABLE ABDOMEN - 1 VIEW  Comparison: None.  Findings: Nonspecific bowel gas pattern with gas filled bowel predominately colon measuring up to 8 cm (distal transverse colon) with small bowel measuring up to 3 cm.  This may indicate an ileus. Result of underlying inflammatory process not entirely excluded.  The possibility of free intraperitoneal air cannot be addressed on a supine view.  Right hip joint degenerative changes.  Degenerative changes lumbar spine.  Cardiomegaly.  IMPRESSION: Nonspecific gas pattern as discussed above.   Original Report Authenticated By: Genia Del, M.D.     Review of Systems  Constitutional: Positive for fever, chills, weight loss, malaise/fatigue and diaphoresis.  HENT: Positive for neck pain.   Eyes: Negative.   Respiratory: Positive for shortness of breath. Negative for cough, hemoptysis and sputum production.   Cardiovascular: Positive for palpitations and orthopnea. Negative for chest pain and leg swelling.  Gastrointestinal: Negative.   Genitourinary: Positive for dysuria.  Musculoskeletal: Positive for myalgias and joint pain.  Skin: Negative.   Neurological: Positive for focal weakness and weakness.       Right leg   Endo/Heme/Allergies: Negative.   Psychiatric/Behavioral: Negative.    Blood pressure 148/60, pulse 69, temperature 99.6 F (37.6 C), temperature source Oral, resp.  rate 20, height 6' (1.829 m), weight 84.4 kg (186 lb 1.1 oz), SpO2 98.00%. Physical Exam  Constitutional: He is oriented to person, place, and time. He appears well-developed and well-nourished. No distress.  HENT:  Head: Normocephalic and atraumatic.  Mouth/Throat: Oropharynx is clear and moist.  Eyes: Conjunctivae normal and EOM are normal. Pupils are equal, round, and reactive to light.  Neck: No JVD present. No thyromegaly present.  Cardiovascular: Normal rate and regular rhythm.  Exam reveals no friction rub.   Murmur heard.      3/6 MR murmur  Respiratory: Effort normal and breath sounds normal. No respiratory distress. He has no wheezes. He has no rales.  GI: Soft. Bowel sounds are normal. He exhibits no distension and no mass. There is no tenderness.  Musculoskeletal: He exhibits no edema.       Right arm in sling.  Right foot bandaged.  Lymphadenopathy:    He has no cervical adenopathy.  Neurological: He is alert and oriented to person, place, and time. No cranial nerve deficit or sensory deficit.       Motor strength 1/6 in left lower extremity  Skin: Skin is warm and dry.  Psychiatric: He has a normal mood and affect.   *  RADIOLOGY REPORT*   Clinical Data:  Altered mental status.  Pyelonephritis possibly with urosepsis.   MRI BRAIN WITHOUT CONTRAST MRA HEAD WITHOUT CONTRAST   Technique: Multiplanar, multiecho pulse sequences of the brain and surrounding structures were obtained according to standard protocol without intravenous contrast.  Angiographic images of the head were obtained using MRA technique without contrast.   Comparison: 09/25/2008 head CT.  No comparison MR.   MRI HEAD   Findings:  Motion degraded exam.   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  Tiny small acute infarcts  involving the posterior left temporal lobe and left parietal - occipital junction.  Tiny acute infarcts left cerebellum and right cerebellar tonsil.   This distribution of infarcts raises possibility of embolic disease.  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   No intracranial hemorrhage.   Mild small vessel disease type changes.   No intracranial mass lesion detected on this unenhanced exam.   No hydrocephalus.   Opacification in the right mastoid air cells and right middle ear. No obstructing lesion is noted in the region of the posterior- superior nasopharynx causing eustachian tube dysfunction.  No findings of thrombosis of the adjacent dural sinuses.   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  Tiny small acute infarcts involving the posterior left temporal lobe and left parietal - occipital junction.  Tiny acute infarcts left cerebellum and right cerebellar tonsil.   This distribution of infarcts raises possibility of embolic disease.  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   Opacification right mastoid air cells and right middle ear as noted above.   MRA HEAD   Findings: Anterior circulation without medium or large size vessel significant stenosis or occlusion.   Mild narrowing involving portions of the A1 segment of the anterior cerebral artery bilaterally and the M1 segment of the middle cerebral artery bilaterally.   Mild branch vessel irregularity middle cerebral artery and anterior cerebral artery distribution.   Codominant vertebral arteries.  No high-grade stenosis of the distal vertebral arteries.   Very  mild narrowing and irregularity mid aspect of the basilar artery.  Poor delineation AICAs.   Mild irregularity and narrowing involving portions of the superior cerebellar arteries.   No aneurysmal or vascular malformation noted.   IMPRESSION: Branch vessel atherosclerotic type changes as noted above.   MRI CERVICAL SPINE WITHOUT CONTRAST   Technique:  Multiplanar and multiecho pulse sequences of the cervical spine, to include the craniocervical junction and cervicothoracic junction, were obtained without intravenous contrast.   Comparison:   None   Findings:  Motion degraded exam.   Abnormal appearance of the C6-7 vertebra and disc space highly suspicious for a diskitis/osteomyelitis with abnormal appearance of the C6-7 and C7-T1 facet joints extending to involve the left T1 pedicle suspicious for involvement by infection.  Epidural abscess suspected although evaluation limited by motion and the fact that no contrast was administered.  This contributes to spinal stenosis and mild cord flattening (most notable C6 level).  Extension of infectious process into the neural foramen on the right at the  C5-6 and C6 level and on the left at the C5-6 through C7-T1 level.   Abnormal signal interspinous region most notable C5-6 level with extending from the C2 level to the C7 level.  Findings suspicious for spread of infection.  There is also edema within paraspinal musculature throughout the posterior neck consistent with cellulitis.   Slight increased signal within the C6 and C7 spinous process suspicious for involvement by by infection.   No definitive cervical cord signal abnormality noted on this motion degraded exam.   Both vertebral arteries and carotid arteries are patent.   C2-3:  Bulge.  Facet joint degenerative changes.  Minimal uncinate hypertrophy.   C3-4:  Broad-based disc osteophyte complex.  Moderate spinal stenosis.  Mild cord flattening.  Moderate bilateral  foraminal narrowing.   C4-5:  Bulge with tiny central protrusion.  Mild spinal stenosis. Minimal bilateral foraminal narrowing.   C5-6:  Bulge/broad-based protrusion.  Mild spinal stenosis.  Mild cord flattening.  Mild bilateral foraminal narrowing.   C6-7:  Diskitis as noted above.   C7-T1:  Facet joint degenerative changes.   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  Epidural abscess suspected although evaluation limited without contrast.  This contributes to spinal stenosis most prominent C6 level.   Cervical spondylotic changes most notable C3-4 level.   Please see above further detail.   Critical Value/emergent results were called by telephone at the time of interpretation on 09/15/2012 at 11:09 am to Dr. McDiarmid, who verbally acknowledged these results.     Original Report Authenticated By: Genia Del, M.D.         Assessment/Plan:  The patient has a large vegetation on the anterior leaflet of themitral valve with central lucency and mitral regurgitation through the area of the vegetation suggesting abscess formation and leaflet perforation. He has had multiple right hemispheric embolic strokes as well as either septic emboli or involvement of the right wrist and foot and cervical spine from bacteremia. I agree it is probably best to proceed with mitral valve replacement sooner rather than later. Followup blood cultures have been negative. He is at increased risk for prosthetic valve endocarditis given multiple areas of involvement. He will require cardiac catheterization prior to surgery and hopefully this can be done later this week so that we can proceed with surgery early next week. I discussed this with the patient and his family and they're in agreement.  BARTLE,BRYAN K 09/19/2012, 1:08 PM

## 2012-09-19 NOTE — Progress Notes (Signed)
Family Medicine Teaching Service Daily Progress Note Service Page: [phone]  Patient Assessment: 66 y.o. year old male with acute encephalopathy (now resolved) secondary to sepsis from multiple sources, s/p I&D of R wrist and R MTP lesion (with HW removal), Multiple small R hemisphere infarcts conserning for embolic vs. Septic emboli, C6-T1 Diskitis, and E. Coli UTI.  .  Subjective:  Feeling ok today, chest pain improved, abdominal pain improved, R hand and R foot pain continued. Dyspnea still present off and on but currently improved. Understands possibility of heart surgery.   Objective: Temp:  [97.9 F (36.6 C)-100.5 F (38.1 C)] 98.1 F (36.7 C) (12/31 0300) Pulse Rate:  [68-133] 69  (12/31 0330) Resp:  [15-26] 20  (12/31 0300) BP: (103-137)/(51-93) 110/51 mmHg (12/31 0300) SpO2:  [90 %-98 %] 98 % (12/31 0300)  Exam: Gen: NAD, alert, cooperative with exam, lyingg in bed with R hand suspended.  HEENT: NCAT, EOMI, MMM CV: RRR, no murmur appreciated today Resp: CTABL, no wheezes, non-labored Abd: soft, mild RUQ tenderness to palpation, BS+ Ext: No edema, warm, R foot wrapped in clean ACE bandage, SCDs in place Neuro: Alert and oriented times 3, No gross deficits  I have reviewed the patient's medications, labs, imaging, and diagnostic testing.  Notable results are summarized below.  CBC BMET   Lab 09/19/12 0500 09/18/12 1500 09/17/12 1120  WBC 13.7* 11.0* 10.6*  HGB 9.8* 9.8* 8.9*  HCT 28.3* 29.0* 27.3*  PLT 441* PLATELET CLUMPS NOTED ON SMEAR, COUNT APPEARS ADEQUATE 219    Lab 09/19/12 0500 09/17/12 1120 09/16/12 0520  NA 137 135 131*  K 3.7 3.9 4.0  CL 104 104 99  CO2 25 19 20   BUN 22 21 20   CREATININE 1.05 0.90 1.10  GLUCOSE 106* 105* 100*  CALCIUM 8.4 8.2* 8.5     Imaging/Diagnostic Tests: V/Q scan 12/28 IMPRESSION:  Low probability study for pulmonary embolus.  CXR 12/28 IMPRESSION:  Increasing bibasilar opacities bilaterally.  MRI Head 12/27 Several  small areas of acute infarction involving the right  hemisphere including portions of the; right occipital lobe, right  temporal lobe, right opercular region, posterior right frontal lobe  and right parietal lobe. Tiny small acute infarcts involving the  posterior left temporal lobe and left parietal - occipital  junction. Tiny acute infarcts left cerebellum and right cerebellar  tonsil.  This distribution of infarcts raises possibility of embolic  disease. As the patient has findings of diskitis of the cervical  spine as discussed below, septic emboli not entirely excluded.  Opacification right mastoid air cells and right middle ear as noted  Above.  MRA head 12/27 Branch vessel atherosclerotic type changes as noted above.  MRI Cervical Spine 12/27 C6-7 diskitis with spread of infection to the adjacent facet  joints, spinous process and interspinous ligaments/posterior  paraspinal musculature as discussed above. Epidural abscess  suspected although evaluation limited without contrast. This  contributes to spinal stenosis most prominent C6 level.  Cervical spondylotic changes most notable C3-4 level.  2D TTE 09/17/2012 - Mitral valve: There was a large, cystic (hypoechoic interior) mass on the lateral aspect of the annulus; the abnormality is new since the previous study; the appearance is consistent with vegetation and/or abscess. Moderate to severe regurgitation, with multiple jets directed eccentrically. - Pericardium, extracardiac: A small pericardial effusion was identified posterior to the heart. The fluid had no internal echoes.There was no evidence of hemodynamic compromise.   Plan: Ryan [name] is a 66 y.o. year old male with  acute encephalopathy secondary to sepsis from multiple sources.    Pericarditis, Afib with RVR - Improving, Likely due to endocarditis rather than prolonged infection - Cardiology on board, greatly appreciate recommendations - ST elevation  this Am is a reflection of ongoing inflammation - Likely Pericarditis causing Afib, concern for purulence, afib currently rhythm controlled - TEE on 12/30 confirmed MV vegetation listed below - HR increased to 120's and 130s late afternoon on 12/30 while febrile, one extra dose of metoprolol given at that time, possibly a reflection of fever, HR returned to 60s-70s this am.   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  - Blood Cx 12/23 shows pan sensitive MSSA, Repeat from 12/26 NGTD - ID on Board-   - Continue Ancef, likely will need 6-8 weeks, needs TEE  - Picc Line in place now  - Reccomendations greatly appreciated - R wrist   - OR by Hand surgery on 12/26 for debridement- Cx with mod MSSA  - Hand surgery signed off - R MTP-   - Debridment and hardware removal on 12/26   - Ortho signed off - Continue to trend CBC and ESR  - WBC up to 13.7 from 11.0 1 day ago  - ESR increased to 66 from 38 1 day ago- possibly due to PICC placement  - Hgb stable around 10 - Will consult wound care for dressing changes since ortho signed off.  - Picc line placed on 12/30, if blood cultures grow anything will consider replacing as it will ikely be seeded.   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 - Greatly appreciate input.   PAD/Ischemic limb:  - Vascular on board- appreciate reccomendations - concern on admission that LE were ischemic. Workup performed by vascular in ED which was positive for PAD on ABI testing. No reported claudication by pt.  - No heparin currently due to cerebral emboli - Vascular plans to defer angio until acute issues resolved  Abdominal Pain - improved today after bowel movement - DG abdomen with no significant findings - Lactic acid WNL - Continue to follow, no other interventions at this time  Acute Kidney Injury.  - Dehydration vs infection induced vs CKD.  - CR 1.4 on admission 1.05 today  - Decreased UOP on 12/30- will monitor and hydrate carefully with CHF and AKI - BMP daily, continue to follow   Elevated Transaminases -  - Pt AST:ALT 2:1 consistent with alcoholic picture. EtOH on admission < 11  - on CIWA   Newly Dx DM II - A1C 6.5 on check  - Consider sensitive SSI if starts to develop hyperglycemia, Blood glucose fine for now.   FEN/GI:  - Carb modified diet  - KVO NS - Strict I/O  Prophylaxis: SCD Disposition: Step down for now, Home with home  health pending further workup and improvement  Kenn File, MD 09/19/2012, 7:15 AM

## 2012-09-20 LAB — CBC
HCT: 28.1 % — ABNORMAL LOW (ref 39.0–52.0)
Hemoglobin: 9.7 g/dL — ABNORMAL LOW (ref 13.0–17.0)
MCH: 22.2 pg — ABNORMAL LOW (ref 26.0–34.0)
MCHC: 34.5 g/dL (ref 30.0–36.0)
MCV: 64.3 fL — ABNORMAL LOW (ref 78.0–100.0)
Platelets: 393 10*3/uL (ref 150–400)
RBC: 4.37 MIL/uL (ref 4.22–5.81)
RDW: 15.3 % (ref 11.5–15.5)
WBC: 14.8 10*3/uL — ABNORMAL HIGH (ref 4.0–10.5)

## 2012-09-20 LAB — BASIC METABOLIC PANEL
BUN: 22 mg/dL (ref 6–23)
CO2: 27 mEq/L (ref 19–32)
Calcium: 8.5 mg/dL (ref 8.4–10.5)
Chloride: 101 mEq/L (ref 96–112)
Creatinine, Ser: 0.99 mg/dL (ref 0.50–1.35)
GFR calc Af Amer: >90 mL/min (ref >90)
GFR calc non Af Amer: 82 mL/min — ABNORMAL LOW (ref >90)
Glucose, Bld: 102 mg/dL — ABNORMAL HIGH (ref 70–99)
Potassium: 3.6 mEq/L (ref 3.5–5.1)
Sodium: 138 mEq/L (ref 135–145)

## 2012-09-20 LAB — CULTURE, BLOOD (ROUTINE X 2): Culture: NO GROWTH

## 2012-09-20 LAB — SEDIMENTATION RATE: Sed Rate: 60 mm/hr — ABNORMAL HIGH (ref 0–16)

## 2012-09-20 MED ORDER — FUROSEMIDE 10 MG/ML IJ SOLN
40.0000 mg | Freq: Two times a day (BID) | INTRAMUSCULAR | Status: DC
  Filled 2012-09-20 (×2): qty 4

## 2012-09-20 MED ORDER — ASPIRIN 81 MG PO CHEW
324.0000 mg | CHEWABLE_TABLET | ORAL | Status: AC
  Administered 2012-09-21: 324 mg via ORAL
  Filled 2012-09-20: qty 1

## 2012-09-20 MED ORDER — SODIUM CHLORIDE 0.9 % IV SOLN: 250.0000 mL | INTRAVENOUS | Status: DC | PRN

## 2012-09-20 MED ORDER — DIAZEPAM 5 MG PO TABS: 5.0000 mg | ORAL_TABLET | ORAL | Status: DC

## 2012-09-20 MED ORDER — SODIUM CHLORIDE 0.9 % IV SOLN
1.0000 mL/kg/h | INTRAVENOUS | Status: DC
  Administered 2012-09-20 – 2012-09-21 (×2): 1 mL/kg/h via INTRAVENOUS

## 2012-09-20 MED ORDER — DIAZEPAM 5 MG PO TABS
5.0000 mg | ORAL_TABLET | ORAL | Status: AC
Start: 2012-09-21 — End: 2012-09-21
  Administered 2012-09-21: 5 mg via ORAL
  Filled 2012-09-20: qty 1

## 2012-09-20 MED ORDER — SODIUM CHLORIDE 0.9 % IJ SOLN: 3.0000 mL | INTRAMUSCULAR | Status: DC | PRN

## 2012-09-20 MED ORDER — SODIUM CHLORIDE 0.9 % IJ SOLN: 3.0000 mL | Freq: Two times a day (BID) | INTRAMUSCULAR | Status: DC

## 2012-09-20 NOTE — Progress Notes (Signed)
INFECTIOUS DISEASE PROGRESS NOTE  ID: Ryan [name] is a 67 y.o. male with afib, PVD, chronic right foot wound with HW, presented with sepsis found to have MSSA bacteremia c/b MSSA right wrist septic arthritis s/p I x D, MSSA right foot wound s/p I x D & HW removal, C6-C7-T1 diskitis/osteo, and several small infarcts to right hemisphere concerning for embolic vs. Septic emboli phenomenon.on TTE found to have small/mod pericardial effusion and TEE found to have large 2.5 x 2.5 cm  MV vegetation with myocardial/valvular abscess  Principal Problem:  *Bacteremia due to Staphylococcus Active Problems:  Fever  Peripheral vascular disease  UTI (urinary tract infection)  Sepsis with metabolic encephalopathy  Septic arthritis of wrist, right  Acute ischemic stroke  Acute pericarditis, unspecified  Atrial fibrillation  Acute diastolic heart failure  Acute combined systolic and diastolic heart failure  Bacterial endocarditis  Mitral valve regurgitation due to infection  Subjective:  afebrile in the last 24hrs. Slept somewhat better.  24hr : discusions with Dr. Tamala Julian and [facility] for upcoming MV replacement. Right and left heart cath on 09/21/12   Abtx:  Day #8 Cefazolin Day #5 (12/24-27 piptazo)  Medications:  Scheduled:    . amiodarone  200 mg Oral BID  . aspirin  81 mg Oral Daily  . atorvastatin  40 mg Oral q1800  .  ceFAZolin (ANCEF) IV  2 g Intravenous Q8H  . colchicine  0.6 mg Oral BID  . folic acid  1 mg Oral Daily  . furosemide  40 mg Intravenous BID  . metoprolol tartrate  25 mg Oral Q6H  . multivitamin with minerals  1 tablet Oral Daily  . sodium chloride  10-40 mL Intracatheter Q12H  . thiamine  100 mg Oral Daily    Objective: Vital signs in last 24 hours: Temp:  [97.6 F (36.4 C)-98.3 F (36.8 C)] 98 F (36.7 C) (01/01 1200) Pulse Rate:  [69-77] 71  (01/01 0800) Resp:  [11-22] 15  (01/01 0800) BP: (122-167)/(61-70) 147/63 mmHg (01/01 0800) SpO2:  [93 %-97 %]  94 % (01/01 0800)   Physical Exam  Constitutional: He is oriented to person, place, and time. He appears well-developed and well-nourished. No distress.  HENT:  Mouth/Throat: Oropharynx is clear and moist. No oropharyngeal exudate.  Cardiovascular :tachy, regular rhythm and normal heart sounds. Exam reveals no gallop and no friction rub.  2/6 SEM at the apex ,radiating to axillary. Pulmonary/Chest: Effort normal and breath sounds normal. No respiratory distress. He has no wheezes.  Abdominal: Soft. Bowel sounds are normal. He exhibits no distension. There is no tenderness.  Lymphadenopathy:  no cervical adenopathy.  Ext: right arm wrapped and elevated; right foot wrapped Skin: Skin is warm and dry. No rash noted. No erythema.     Lab Results  Basename 09/20/12 0406 09/19/12 0500  WBC 14.8* 13.7*  HGB 9.7* 9.8*  HCT 28.1* 28.3*  NA 138 137  K 3.6 3.7  CL 101 104  CO2 27 25  BUN 22 22  CREATININE 0.99 1.05  GLU -- --   Sedimentation Rate  Basename 09/20/12 0406  ESRSEDRATE 5*    Microbiology: 12/24 urine cx Ecoli (pan sensitive) 12/24 blood cx x 2 MSSA ( oxa S, pcn S) 12/24 right wrist arthrocentesis cx MSSA( oxa S, pcn R) 12/25 right foot cx MSSA (oxa s, pcn S) 12/25 right wrist cx MSSa ( oxa S, pcn S) 12/26 blood cx x 1 NGTD 12/30 blood cx x 2 PENDING  Studies/Results: Mri  12/27 brain:Several small areas of acute infarction involving the right  hemisphere including portions of the; right occipital lobe, right  temporal lobe, right opercular region, posterior right frontal lobe  and right parietal lobe. Tiny small acute infarcts involving the  posterior left temporal lobe and left parietal - occipital  junction. Tiny acute infarcts left cerebellum and right cerebellar  tonsil.  This distribution of infarcts raises possibility of embolic  disease. As the patient has findings of diskitis of the cervical  spine as discussed below, septic emboli not entirely  excluded.  Opacification right mastoid air cells and right middle ear as noted above.  Mri 12/27 c spine: Abnormal appearance of the C6-7 vertebra and disc space highly  suspicious for a diskitis/osteomyelitis with abnormal appearance of  the C6-7 and C7-T1 facet joints extending to involve the left T1  pedicle suspicious for involvement by infection. Epidural abscess  suspected although evaluation limited by motion and the fact that  no contrast was administered. This contributes to spinal stenosis  and mild cord flattening (most notable C6 level). Extension of  infectious process into the neural foramen on the right at the C5-6  and C6 level and on the left at the C5-6 through C7-T1 level.  Abnormal signal interspinous region most notable C5-6 level with  extending from the C2 level to the C7 level. Findings suspicious  for spread of infection. There is also edema within paraspinal  musculature throughout the posterior neck consistent with  cellulitis.  Slight increased signal within the C6 and C7 spinous process  suspicious for involvement by by infection.  No definitive cervical cord signal abnormality noted on this motion  degraded exam.   Assessment/Plan: Ryan [name] is a 67 y.o. male with afib, PVD, chronic right foot wound with HW, presented with sepsis found to have MSSA bacteremia c/b MSSA right wrist septic arthritis s/p I x D, MSSA right foot wound s/p I x D & HW removal, C6-C7-T1 diskitis/osteo, and several small infarcts to right hemisphere concerning for embolic vs. Septic emboli phenomenon. And large MV vegetation with myocardial/valvularabscess:  1) MSSA  MV native valve endocarditis: continue with cefazolin 2gm IV Q 8hr; will follow surveillance blood culture. Patient on the schedule for heart cath on 1/2 then cardiac surgery on tuesday  2) MSSA bacteremia with metastatic disease (cervical diskitis and right wrist septic arthritis and right foot wound) = continue with  cefazolin 2gm IV q8hr for a minimum of 6-8 wks. He has cleared his bacteremia 12/26 blood cx.  3) embolic infarct concerning for septic emboli/endocarditis = now thought to be due to endocarditis from MV veg.  4) pericardial effusion = appears stable or slightly smaller on repeat TTE. cardiology is guiding further need for drainage  5)  fever =will follow up on blood cultures from 12/30, continue to repeat blood cultures if temp > 100.3  6)ecoli uti = resolved, treated         [place], [place] [phone] pager [phone] cell 09/20/2012, 2:18 PM   LOS: 8 days

## 2012-09-20 NOTE — Progress Notes (Signed)
Patient Name: Ryan [name] Date of Encounter: 09/20/2012    SUBJECTIVE: The patient had a quiet and relatively restful night. There is no significant change in breathing. He is able to lay flat relatively comfortably. No new neurological complaints or abdominal complaints.  TELEMETRY:  Normal sinus rhythm: Filed Vitals:   09/19/12 1900 09/19/12 2300 09/20/12 0300 09/20/12 0800  BP: 167/61 150/65 150/70 147/63  Pulse: 77 69 71 71  Temp: 98.3 F (36.8 C) 97.6 F (36.4 C) 98.1 F (36.7 C) 98.2 F (36.8 C)  TempSrc: Oral Oral Oral Oral  Resp: 22 11 14 15   Height:      Weight:      SpO2: 97% 94% 95% 94%    Intake/Output Summary (Last 24 hours) at 09/20/12 1120 Last data filed at 09/20/12 0750  Gross per 24 hour  Intake    704 ml  Output   1850 ml  Net  -1146 ml    LABS: Basic Metabolic Panel:  Basename 09/20/12 0406 09/19/12 0500  NA 138 137  K 3.6 3.7  CL 101 104  CO2 27 25  GLUCOSE 102* 106*  BUN 22 22  CREATININE 0.99 1.05  CALCIUM 8.5 8.4  MG -- --  PHOS -- --   CBC:  Basename 09/20/12 0406 09/19/12 0500  WBC 14.8* 13.7*  NEUTROABS -- --  HGB 9.7* 9.8*  HCT 28.1* 28.3*  MCV 64.3* 63.9*  PLT 393 441*   Radiology/Studies:  No new data  Physical Exam: Blood pressure 147/63, pulse 71, temperature 98.2 F (36.8 C), temperature source Oral, resp. rate 15, height 6' (1.829 m), weight 84.4 kg (186 lb 1.1 oz), SpO2 94.00%. Weight change:    Low pitched 2-3 of 6 systolic murmur at the apex with radiation into the anterior axillary line. A very faint pericardial rub is heard.  No significant JVD.  ASSESSMENT:  1. 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 Mitral regurgitation producing acute diastolic heart failure, relatively stable on current level of diuresis.  3. No recurrence of atrial fibrillation  4. Status post cerebral emboli  Plan:  1. N.p.o. after midnight for left and right heart catheterization  in a.m. The procedure and risks including cerebral infarction, death, myocardial infarction, allergy, bleeding, death, kidney impairment, among others were discussed in detail with the patient and wife. Patient accepts risks and is willing to proceed.  2. Will hold Lasix until post cath to prevent renal injury  Signed, [name] 09/20/2012, 11:20 AM

## 2012-09-20 NOTE — Progress Notes (Signed)
[facility] Attending Daily Note: Dorcas Mcmurray MD [phone] pager office [phone] I  have seen and examined this patient, reviewed their chart. I have discussed this patient with the resident. I agree with the resident's findings, assessment and care plan. Seems stable and relatively comfortable. I discussed with him and his wife who is at bedside.

## 2012-09-20 NOTE — Progress Notes (Signed)
Family Medicine Teaching Service Daily Progress Note Service Page: [phone]  Patient Assessment: 67 y.o. year old male with acute encephalopathy (now resolved) secondary to sepsis from multiple sources, s/p I&D of R wrist and R MTP lesion (with HW removal), Multiple small R hemisphere infarcts that are likely septic emboli, C6-T1 Diskitis, pericarditis,  Endocarditis, and E. Coli UTI.  .  Subjective:  Feeling ok today, chest pain improved, abdominal pain relieved after bowel movement this am, R hand and R foot pain continued but not as severe as before. Dyspnea still present off and on but currently improved. Understands heart cath  tomorrow and probable surgery early next week.   Objective: Temp:  [97.6 F (36.4 C)-99.6 F (37.6 C)] 98.1 F (36.7 C) (01/01 0300) Pulse Rate:  [69-86] 71  (01/01 0300) Resp:  [11-22] 14  (01/01 0300) BP: (122-167)/(60-70) 150/70 mmHg (01/01 0300) SpO2:  [93 %-97 %] 95 % (01/01 0300)  Exam: Gen: NAD, alert, cooperative with exam, sitting in bed with R hand elevated eating breakfast.  HEENT: NCAT, EOMI, MMM CV: RRR, no murmur appreciated today Resp: CTABL, no wheezes, non-labored Abd: soft, mild RUQ tenderness to palpation, BS+ Ext: No edema, warm, R foot wrapped in clean ACE bandage, SCDs in place Neuro: Alert and oriented times 3, face symmetric, speaks easily, No gross deficits  I have reviewed the patient's medications, labs, imaging, and diagnostic testing.  Notable results are summarized below.  CBC BMET   Lab 09/20/12 0406 09/19/12 0500 09/18/12 1500  WBC 14.8* 13.7* 11.0*  HGB 9.7* 9.8* 9.8*  HCT 28.1* 28.3* 29.0*  PLT 393 441* PLATELET CLUMPS NOTED ON SMEAR, COUNT APPEARS ADEQUATE    Lab 09/20/12 0406 09/19/12 0500 09/17/12 1120  NA 138 137 135  K 3.6 3.7 3.9  CL 101 104 104  CO2 27 25 19   BUN 22 22 21   CREATININE 0.99 1.05 0.90  GLUCOSE 102* 106* 105*  CALCIUM 8.5 8.4 8.2*       09/15/2012 11:50 09/16/2012 05:20 09/18/2012  15:00 09/19/2012 05:00 09/20/2012 04:06  Sed Rate 75 (H) 67 (H) 38 (H) 66 (H) 60 (H)    Imaging/Diagnostic Tests: TEE 09/18/2012 - Left ventricle: The cavity size was normal. Wall thickness was normal. Systolic function was normal. The estimated ejection fraction was in the range of 60% to 65%. - Aortic valve: No evidence of vegetation. - Mitral valve: Large 2.5cm diameter vegetation/ mass on the atrial surface of the anterior mitral leaflet with central cavity (Doppler flow within) leading to two separate mitral regurgitant jets (moderate grade) exiting from the mass itself. - Left atrium: No evidence of thrombus in the atrial cavity or appendage. - Right atrium: No evidence of thrombus in the atrial cavity or appendage. - Atrial septum: No defect or patent foramen ovale was identified. - Tricuspid valve: No evidence of vegetation. - Pulmonic valve: No evidence of vegetation. - Superior vena cava: The study excluded a thrombus. - Pericardium, extracardiac: A small, free-flowing pericardial effusion was identified along the right atrial free wall.  2D TTE 09/17/2012 - Mitral valve: There was a large, cystic (hypoechoic interior) mass on the lateral aspect of the annulus; the abnormality is new since the previous study; the appearance is consistent with vegetation and/or abscess. Moderate to severe regurgitation, with multiple jets directed eccentrically. - Pericardium, extracardiac: A small pericardial effusion was identified posterior to the heart. The fluid had no internal echoes.There was no evidence of hemodynamic compromise.  V/Q scan 12/28 IMPRESSION:  Low probability  study for pulmonary embolus.  CXR 12/28 IMPRESSION:  Increasing bibasilar opacities bilaterally.  MRI Head 12/27 Several small areas of acute infarction involving the right  hemisphere including portions of the; right occipital lobe, right  temporal lobe, right opercular region, posterior right  frontal lobe  and right parietal lobe. Tiny small acute infarcts involving the  posterior left temporal lobe and left parietal - occipital  junction. Tiny acute infarcts left cerebellum and right cerebellar  tonsil.  This distribution of infarcts raises possibility of embolic  disease. As the patient has findings of diskitis of the cervical  spine as discussed below, septic emboli not entirely excluded.  Opacification right mastoid air cells and right middle ear as noted  Above.  MRA head 12/27 Branch vessel atherosclerotic type changes as noted above.  MRI Cervical Spine 12/27 C6-7 diskitis with spread of infection to the adjacent facet  joints, spinous process and interspinous ligaments/posterior  paraspinal musculature as discussed above. Epidural abscess  suspected although evaluation limited without contrast. This  contributes to spinal stenosis most prominent C6 level.  Cervical spondylotic changes most notable C3-4 level.   Plan: Ryan [name] is a 67 y.o. year old male with acute encephalopathy secondary to sepsis from multiple sources.    CV- Stable - Planned Heart cath tomorrow (09/21/12) - Probable mitral valve replacement early next week  Pericarditis, Afib with RVR - Improving, Likely due to endocarditis rather than prolonged infection - Cardiology on board, greatly appreciate recommendations - Likely Pericarditis causing Afib, concern for purulence, afib currently rhythm controlled with amiodarone - TEE on 12/30 confirmed MV vegetation listed below and slight improvement of pericarditis  Acute Diastolic Heart failure - secondary to mitral regurgitation caused by large vegetation - Lasix 40 mg IV BID - Strict I/O, daily weights - Positive 230 mL today, KVO fluids  ID - metastatic infection - ID on Board-   - Continue Ancef, likely will need 6-8 weeks, needs TEE  - Reccomendations greatly appreciated - WBC trending up, 11.0 on 12/30, 13.7 12/31, 14.8 today -  Blood culture from 12/30 during fever pending, No growth on previous from 12/26  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, likely source of emboli seen on MRI previously - ID, cardiology, and CT surgery following and treating- appreciate management and reccs - Will follow along for likely need for surgical intervention  MSSA Bacteremia - Blood Cx 12/23 shows pan sensitive MSSA, Repeat from 12/26 NGTD, Repeated on 09/18/2012- pending - R wrist   - OR by Hand surgery on 12/26 for debridement- Cx with mod MSSA  - Hand surgery signed off - R MTP-   - Debridment and hardware removal on 12/26   - Ortho signed off - Continue to trend CBC and ESR  - WBC up to 13.7 from 11.0 1 day ago  - ESR increased 60 from 66 1day ago  - Hgb stable around 10 - Will consult wound care for dressing changes since ortho signed off.  - Picc line placed on 12/30, if blood cultures grow anything will consider replacing as it will ikely be seeded.   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 - Greatly appreciate input.   PAD/Ischemic limb:  - Vascular on board- appreciate reccomendations - concern on admission that LE were ischemic. Workup performed by vascular in ED  which was positive for PAD on ABI testing. No reported claudication by pt.  - No heparin currently due to cerebral emboli - Vascular plans to defer angio until acute issues resolved  Abdominal Pain - improved today after bowel movement - DG abdomen with no significant findings - Lactic acid WNL - Continue to follow, no other interventions at this time  Acute Kidney Injury.  - Dehydration vs infection induced vs CKD.  - CR 1.4 on admission 0.99 today  - good UOP - 1.8L on 12/31 - BMP daily, continue to follow   Elevated Transaminases -    - Pt AST:ALT 2:1 consistent with alcoholic picture. EtOH on admission < 11  - on CIWA   Newly Dx DM II - A1C 6.5 on check  - Consider sensitive SSI if starts to develop hyperglycemia, Blood glucose fine for now.   FEN/GI:  - Carb modified diet  - KVO NS - Strict I/O  Prophylaxis: SCD Disposition: Step down for now, Home with home health pending further workup and improvement  Kenn File, MD 09/20/2012, 7:24 AM

## 2012-09-20 NOTE — Progress Notes (Signed)
PT Hydrotherapy Note:   09/20/12 1248  Subjective Assessment  Subjective "Let's try to do it."  Patient and Family Stated Goals heal wound  Date of Onset 09/15/12  Prior Treatments I&D  Evaluation and Treatment  Evaluation and Treatment Procedures Explained to Patient/Family Yes  Evaluation and Treatment Procedures agreed to  Wound 09/16/12 Other (Comment) Wrist Right;Other (Comment) Right wrist  Date First Assessed/Time First Assessed: 09/16/12 1005   Wound Type: Other (Comment)  Location: Wrist  Location Orientation: Right;Other (Comment)  Wound Description (Comments): Right wrist  Present on Admission: No  Site / Wound Assessment Red;Clean;Other (Comment) (Tendon and exposed fascia)  % Wound base Red or Granulating 40%  % Wound base Other (Comment) 60% (Fascia and tendons.)  Peri-wound Assessment Intact  Margins Unattacted edges (unapproximated)  Closure Sutures ((2 black sutures (one proximal end of wound, one at distal)))  Drainage Amount Minimal  Drainage Description Serosanguineous;No odor  Non-staged Wound Description Full thickness  Treatment Hydrotherapy (Pulse lavage);Packing (Saline gauze)  Dressing Type Gauze (Comment);Compression wrap;Moist to moist;Other (Comment);Impregnated gauze (bismuth) (1/4" plain packing strip NS Moist to Moist, xeroform)  Dressing Changed Changed  Dressing Status Clean;Dry;Intact (includes splint, kerlix, ace wrap, mission sling)  Hydrotherapy  Pulsed Lavage with Suction (psi) 4 psi  Pulsed Lavage with Suction - Normal Saline Used 1000 mL  Pulsed Lavage Tip Tip with splash shield  Pulsed lavage therapy - wound location Rt dorsal wrist  Wound Therapy - Assess/Plan/Recommendations  Wound Therapy - Clinical Statement Pt admitted with right wrist infection undergoing I & D 09/15/12. Wound bed is free of necrotic tissue and drainage was NOT purulent. Hydrotherapy ordered for every other day. Wound continues to be free of necrotic tissue, and  recommend d/c hydrotherapy. Will follow-up on 09/22/12.  Wound Therapy - Functional Problem List Increased pain causing decreased use of right UE.  Factors Delaying/Impairing Wound Healing Infection - systemic/local;Immobility  Hydrotherapy Plan Debridement;Dressing change;Patient/family education;Pulsatile lavage with suction  Wound Therapy - Frequency 3X / week  Wound Therapy - Follow Up Recommendations Home health RN  Wound Plan rec d/c hydrotherapy    09/20/2012 Cyndia Bent, PT, DPT [phone]

## 2012-09-21 ENCOUNTER — Other Ambulatory Visit: Payer: Self-pay | Admitting: *Deleted

## 2012-09-21 ENCOUNTER — Encounter (HOSPITAL_COMMUNITY)
Admission: EM | Disposition: A | Discharge status: Skilled Nursing Facility | Payer: Self-pay | Source: Home / Self Care | Attending: Family Medicine

## 2012-09-21 ENCOUNTER — Encounter (HOSPITAL_COMMUNITY): Payer: Medicare PPO

## 2012-09-21 DIAGNOSIS — G061 Intraspinal abscess and granuloma: Secondary | ICD-10-CM

## 2012-09-21 DIAGNOSIS — I38 Endocarditis, valve unspecified: Secondary | ICD-10-CM

## 2012-09-21 DIAGNOSIS — Z01810 Encounter for preprocedural cardiovascular examination: Secondary | ICD-10-CM

## 2012-09-21 HISTORY — PX: RIGHT HEART CATHETERIZATION: SHX5447

## 2012-09-21 HISTORY — PX: CORONARY ANGIOGRAM: SHX5466

## 2012-09-21 LAB — CBC
HCT: 28 % — ABNORMAL LOW (ref 39.0–52.0)
Hemoglobin: 9.6 g/dL — ABNORMAL LOW (ref 13.0–17.0)
MCH: 22.2 pg — ABNORMAL LOW (ref 26.0–34.0)
MCHC: 34.3 g/dL (ref 30.0–36.0)
MCV: 64.8 fL — ABNORMAL LOW (ref 78.0–100.0)
Platelets: 398 10*3/uL (ref 150–400)
RBC: 4.32 MIL/uL (ref 4.22–5.81)
RDW: 15.3 % (ref 11.5–15.5)
WBC: 14.6 10*3/uL — ABNORMAL HIGH (ref 4.0–10.5)

## 2012-09-21 LAB — POCT I-STAT 3, ART BLOOD GAS (G3+)
Acid-Base Excess: 1 mmol/L (ref 0.0–2.0)
Bicarbonate: 24.4 mEq/L — ABNORMAL HIGH (ref 20.0–24.0)
O2 Saturation: 96 %
TCO2: 25 mmol/L (ref 0–100)
pCO2 arterial: 32.7 mmHg — ABNORMAL LOW (ref 35.0–45.0)
pH, Arterial: 7.481 — ABNORMAL HIGH (ref 7.350–7.450)
pO2, Arterial: 76 mmHg — ABNORMAL LOW (ref 80.0–100.0)

## 2012-09-21 LAB — BASIC METABOLIC PANEL
BUN: 21 mg/dL (ref 6–23)
CO2: 26 mEq/L (ref 19–32)
Calcium: 8.4 mg/dL (ref 8.4–10.5)
Chloride: 100 mEq/L (ref 96–112)
Creatinine, Ser: 0.85 mg/dL (ref 0.50–1.35)
GFR calc Af Amer: >90 mL/min (ref >90)
GFR calc non Af Amer: 88 mL/min — ABNORMAL LOW (ref >90)
Glucose, Bld: 92 mg/dL (ref 70–99)
Potassium: 3.5 mEq/L (ref 3.5–5.1)
Sodium: 136 mEq/L (ref 135–145)

## 2012-09-21 LAB — POCT I-STAT 3, VENOUS BLOOD GAS (G3P V)
Bicarbonate: 24.3 mEq/L — ABNORMAL HIGH (ref 20.0–24.0)
O2 Saturation: 68 %
TCO2: 25 mmol/L (ref 0–100)
pCO2, Ven: 36.7 mmHg — ABNORMAL LOW (ref 45.0–50.0)
pH, Ven: 7.43 — ABNORMAL HIGH (ref 7.250–7.300)
pO2, Ven: 34 mmHg (ref 30.0–45.0)

## 2012-09-21 LAB — MRSA PCR SCREENING: MRSA by PCR: NEGATIVE

## 2012-09-21 LAB — PROTIME-INR
INR: 1.21 (ref 0.00–1.49)
Prothrombin Time: 15.1 seconds (ref 11.6–15.2)

## 2012-09-21 LAB — SEDIMENTATION RATE: Sed Rate: 57 mm/hr — ABNORMAL HIGH (ref 0–16)

## 2012-09-21 SURGERY — CORONARY ANGIOGRAM

## 2012-09-21 MED ORDER — MIDAZOLAM HCL 2 MG/2ML IJ SOLN
INTRAMUSCULAR | Status: AC
  Filled 2012-09-21: qty 2

## 2012-09-21 MED ORDER — NITROGLYCERIN 0.2 MG/ML ON CALL CATH LAB
INTRAVENOUS | Status: AC
  Filled 2012-09-21: qty 1

## 2012-09-21 MED ORDER — HEPARIN (PORCINE) IN NACL 2-0.9 UNIT/ML-% IJ SOLN
INTRAMUSCULAR | Status: AC
  Filled 2012-09-21: qty 1500

## 2012-09-21 MED ORDER — FENTANYL CITRATE 0.05 MG/ML IJ SOLN
INTRAMUSCULAR | Status: AC
  Filled 2012-09-21: qty 2

## 2012-09-21 MED ORDER — HYDRALAZINE HCL 20 MG/ML IJ SOLN
10.0000 mg | Freq: Once | INTRAMUSCULAR | Status: AC
  Administered 2012-09-21: 10 mg via INTRAVENOUS

## 2012-09-21 MED ORDER — ONDANSETRON HCL 4 MG/2ML IJ SOLN: 4.0000 mg | Freq: Four times a day (QID) | INTRAMUSCULAR | Status: DC | PRN

## 2012-09-21 MED ORDER — LIDOCAINE HCL (PF) 1 % IJ SOLN
INTRAMUSCULAR | Status: AC
  Filled 2012-09-21: qty 30

## 2012-09-21 MED ORDER — SODIUM CHLORIDE 0.9 % IV SOLN
INTRAVENOUS | Status: AC
  Administered 2012-09-21: 16:00:00 via INTRAVENOUS

## 2012-09-21 MED ORDER — ACETAMINOPHEN 325 MG PO TABS: 650.0000 mg | ORAL_TABLET | ORAL | Status: DC | PRN

## 2012-09-21 MED ORDER — SODIUM CHLORIDE 0.9 % IJ SOLN
INTRAMUSCULAR | Status: AC
  Filled 2012-09-21: qty 20

## 2012-09-21 MED ORDER — HYDRALAZINE HCL 20 MG/ML IJ SOLN
INTRAMUSCULAR | Status: AC
  Filled 2012-09-21: qty 1

## 2012-09-21 NOTE — Progress Notes (Signed)
Pt from cath lab alert and oriented, site rt groin  dsg CDI family at bedside

## 2012-09-21 NOTE — CV Procedure (Signed)
     Diagnostic Left and Right Heart Catheterization with Coronary Angiography Report  Ryan [name]  67 y.o.  male [birth year]  Procedure Date:09/21/2012 Referring Physician: Family Practice Service  Primary Cardiologist:: Helyn Numbers, M.D.   PROCEDURE:  Left heart catheterization with selective coronary angiography, left ventriculogram.  INDICATIONS:  Mitral valve endocarditis with mitral regurgitation and acute diastolic heart failure  The risks, benefits, and details of the procedure were explained to the patient.  The patient verbalized understanding and wanted to proceed.  Informed written consent was obtained.  PROCEDURE TECHNIQUE:  After Xylocaine anesthesia a 7 [place] and a 5 [place] sheath was placed in the right vein and femoral artery respectively with a single anterior needle wall stick.   Coronary angiography was done using a 5 French 4 cm left and right Judkins catheters.  Left ventriculography was done using a 4 cm right Judkins catheter.    CONTRAST:  Total of 55 cc.  COMPLICATIONS:  None.    HEMODYNAMICS:  Aortic pressure was 128/72 mmHg; LV pressure was not performed; LVEDP not obtained; right atrial mean 10 mmHg; right ventricular 50/10 mmHg; pulmonary artery 50/23 mmHg; pulmonary capillary wedge mean 14 mm mercury without significant V wave.    ANGIOGRAPHIC DATA:   The left main coronary artery is normal.  The left anterior descending artery is dominant and widely patent.  The left circumflex artery is widely patent and gives origin to 3 obtuse marginal branches..  The right coronary artery is normal.  LEFT VENTRICULOGRAM:  Not performed  IMPRESSIONS:  1. Widely patent coronary arteries  2. Moderate to severe pulmonary hypertension with mean pulmonary capillary wedge pressure of 14 mmHg no significant V wave.   RECOMMENDATION:  1. Recumbency 4 hours  2. Resume Lasix but will decrease to 40 mg daily.

## 2012-09-21 NOTE — Clinical Social Work Placement (Addendum)
    Clinical Social Work Department CLINICAL SOCIAL WORK PLACEMENT NOTE 09/21/2012  Patient:  Ryan [name], Ryan [name]  Account Number:  [id number] Admit date:  09/12/2012  Clinical Social Worker:  Hunt Oris, Latanya Presser  Date/time:  09/21/2012 11:45 AM  Clinical Social Work is seeking post-discharge placement for this patient at the following level of care:   [place]   (*CSW will update this form in Epic as items are completed)   09/21/2012  Patient/family provided with [place] Department of Clinical Social Work's list of facilities offering this level of care within the geographic area requested by the patient (or if unable, by the patient's family).  09/21/2012  Patient/family informed of their freedom to choose among providers that offer the needed level of care, that participate in Medicare, Medicaid or managed care program needed by the patient, have an available bed and are willing to accept the patient.  09/21/2012  Patient/family informed of [facility]' ownership interest in [facility], as well as of the fact that they are under no obligation to receive care at this facility.  PASARR submitted to EDS on 09/21/2012 PASARR number received from [employer] on 09/21/2012  FL2 transmitted to all facilities in geographic area requested by pt/family on  09/21/2012 FL2 transmitted to all facilities within larger geographic area on   Patient informed that his/her managed care company has contracts with or will negotiate with  certain facilities, including the following:     Patient/family informed of bed offers received:   Patient chooses bed at  Physician recommends and patient chooses bed at    Patient to be transferred to  on   Patient to be transferred to facility by   The following physician request were entered in Epic:   Additional Comments:

## 2012-09-21 NOTE — Progress Notes (Signed)
Physical Therapy Treatment Patient Details Name: Ryan [name] MRN: [id number] DOB: 06/18/1946 Today's Date: 09/21/2012 Time: [id number] PT Time Calculation (min): 26 min  PT Assessment / Plan / Recommendation Comments on Treatment Session  Pt showing progress this session with less assistance needed with all mobility and transfers.  Pt continues to have difficulty advancing LE against gravity.  Continue to recommend inpatient rehab at this time.  Pt scheduled for cardiac cath later today.    Follow Up Recommendations  CIR     Equipment Recommendations  Rolling walker with 5" wheels    Recommendations for Other Services Rehab consult  Frequency Min 3X/week   Plan Discharge plan remains appropriate;Frequency remains appropriate    Precautions / Restrictions Precautions Precautions: Fall Restrictions Weight Bearing Restrictions: Yes RUE Weight Bearing: Non weight bearing RLE Weight Bearing: Weight bearing as tolerated   Pertinent Vitals/Pain No c/o pain when asked    Mobility  Bed Mobility Bed Mobility: Supine to Sit Supine to Sit: 1: +2 Total assist Supine to Sit: Patient Percentage: 50% Sit to Supine: 3: Mod assist Details for Bed Mobility Assistance: +2 (A) to elevate trunk OOB and LE OOB.  Max cues for proper technique Transfers Transfers: Sit to Stand;Stand to Sit Sit to Stand: 1: +2 Total assist;Without upper extremity assist;From bed Sit to Stand: Patient Percentage: 70% Stand to Sit: 1: +2 Total assist;To bed Stand to Sit: Patient Percentage: 70% Details for Transfer Assistance: +2 (A) to to initiate and complete transfer with max cues for hand placement and maintain NWB on RUE.   Ambulation/Gait Ambulation/Gait Assistance: Not tested (comment) Wheelchair Mobility Wheelchair Mobility: No    Exercises General Exercises - Lower Extremity Ankle Circles/Pumps: AROM;Both;10 reps Long Arc Quad: Strengthening;Both;10 reps Hip Flexion/Marching: Strengthening;Both;10  reps;Seated;Standing   PT Diagnosis:    PT Problem List:   PT Treatment Interventions:     PT Goals Acute Rehab PT Goals PT Goal Formulation: With patient/family Time For Goal Achievement: 10/03/12 Potential to Achieve Goals: Good Pt will go Supine/Side to Sit: with min assist PT Goal: Supine/Side to Sit - Progress: Progressing toward goal Pt will go Sit to Supine/Side: with min assist PT Goal: Sit to Supine/Side - Progress: Progressing toward goal Pt will go Sit to Stand: with min assist PT Goal: Sit to Stand - Progress: Progressing toward goal Pt will go Stand to Sit: with min assist PT Goal: Stand to Sit - Progress: Progressing toward goal  Visit Information  Last PT Received On: 09/21/12 Assistance Needed: +2    Subjective Data  Subjective: "I'm doing ok today." Patient Stated Goal: To get stronger and go home   Cognition  Overall Cognitive Status: Appears within functional limits for tasks assessed/performed Orientation Level: Appears intact for tasks assessed Behavior During Session: [facility] for tasks performed    Balance  Balance Balance Assessed: Yes Static Sitting Balance Static Sitting - Balance Support: Feet supported Static Sitting - Level of Assistance: 5: Stand by assistance Static Standing Balance Static Standing - Balance Support: Left upper extremity supported Static Standing - Level of Assistance: 4: Min assist Static Standing - Comment/# of Minutes: (A) to maintain balance and prevent posterior lean.  Pt tends to keep LE against bed to maintain balance. Dynamic Standing Balance Dynamic Standing - Balance Support: Left upper extremity supported Dynamic Standing - Level of Assistance: 1: +2 Total assist;Patient percentage (comment) (pt 70%) Dynamic Standing - Balance Activities: Lateral lean/weight shifting;Forward lean/weight shifting (Marching in place) Dynamic Standing - Comments: Pt having difficulty with  marching in place and elevating LE off floor.    End of Session PT - End of Session Equipment Utilized During Treatment: Gait belt;Oxygen (2L) Activity Tolerance: Patient limited by fatigue Patient left: in bed;with call bell/phone within reach (Pt return to bed due to going down for procedure) Nurse Communication: Mobility status   GP     Jamair Cato 09/21/2012, 10:53 AM Antoine Poche, PT DPT [phone]

## 2012-09-21 NOTE — Progress Notes (Signed)
VASCULAR LAB PRELIMINARY  PRELIMINARY  PRELIMINARY  PRELIMINARY  Pre-op Cardiac Surgery  Carotid Findings:  09/15/2012 Ralene Cork, RVT Bilateral:  No evidence of hemodynamically significant internal carotid artery stenosis.   Vertebral artery flow is antegrade.     Upper Extremity Right Left  Brachial Pressures Unable to evaluate right arm due to surgical wrappings from an I and D of the right wrist 09/15/2011 for infection Unable to obtain secondary to Restricted limb due to PICC line in the upper arm  Radial Waveforms  Triphasic  Ulnar Waveforms  Triphasic  Palmar Arch (Allen's Test)  Normal   Findings:  Unable to evaluate  The right arm due to note mention above. Doppler waveforms of the left arm remained normal with both radial and ulnar compressions.   Lower extremity exam completed 09/12/2012 Ralene Cork, RVT Lower  Extremity Right Left  Brachial pressures 143 133  Anterior Tibial 69 Monophasic 138 Biphasic  Posterior Tibial 66 Monophasic 113 Monophasic  Ankle/Brachial Indices 0.48 0.97    Findings:  Right ABI  Is in the severe loss of flow category. Left ABI appears within normal limits but may be falsely elevated. Duplex scan of the right lower extremity demonstrates mild diffuse smooth calcific plaque throughout with a significant stenosis at the trifurcation. Patient was seen by Dr Trula Slade MD. Patient to undergo lower extremity angiogram after clearing of infection.   Lennie Vasco, RVS 09/21/2012, 4:57 PM

## 2012-09-21 NOTE — Consult Note (Signed)
Wound consult requested for right wrist and right foot.  Pt had I&D to both areas on 12/26.  Dr Fredna Dow of hand surgery is following for assessment and plan of care to right wrist during the post-op period.  Called his office to discuss plan of care.  Pt has been receiving hydrotherapy from physical therapy every other day and office states Dr Fredna Dow is still following pt for assessment and plan of care.  Please consult hand surgery team for further questions regarding plan of care.  Dr Berenice Primas performed I&D of right foot on 12/26.  Their team performed follow-up assessment on 12/28.  Foot was wrapped in gauze and they indicated they would follow-up in the office after discharge.  If there are further questions regarding plan of care, please consult ortho service.    Will not plan to follow further unless re-consulted.  [address], [place], MSN, [place]

## 2012-09-21 NOTE — Progress Notes (Signed)
Family Medicine Teaching Service Daily Progress Note Service Page: [phone]  Patient Assessment: 67 y.o. year old male with acute encephalopathy (now resolved) secondary to sepsis from multiple sources, s/p I&D of R wrist and R MTP lesion (with HW removal), Multiple small R hemisphere infarcts that are likely septic emboli, C6-T1 Diskitis, pericarditis,  Endocarditis, and E. Coli UTI.  .  Subjective:  Feeling ok today, chest pain resolved and rested well. Dyspnea still present off and on but less frequent. Denies new weakness.   Objective: Temp:  [97.9 F (36.6 C)-98.3 F (36.8 C)] 98.3 F (36.8 C) (01/02 0300) Pulse Rate:  [69-78] 76  (01/02 0438) Resp:  [11-23] 14  (01/02 0438) BP: (133-175)/(62-76) 154/64 mmHg (01/02 0438) SpO2:  [93 %-99 %] 93 % (01/02 0438)  Exam: Gen: NAD, alert, cooperative with exam, sitting in bed with R hand elevated eating breakfast.  HEENT: NCAT, EOMI, MMM CV: RRR, no murmur appreciated today Resp: CTABL, no wheezes, non-labored Abd: soft, mild RUQ tenderness to palpation, BS+ Ext: No edema, warm, R foot wrapped in clean ACE bandage, SCDs in place Neuro: Alert and oriented times 3, face symmetric, speaks easily, No gross deficits  I have reviewed the patient's medications, labs, imaging, and diagnostic testing.  Notable results are summarized below.  CBC BMET   Lab 09/21/12 0410 09/20/12 0406 09/19/12 0500  WBC 14.6* 14.8* 13.7*  HGB 9.6* 9.7* 9.8*  HCT 28.0* 28.1* 28.3*  PLT 398 393 441*    Lab 09/21/12 0410 09/20/12 0406 09/19/12 0500  NA 136 138 137  K 3.5 3.6 3.7  CL 100 101 104  CO2 26 27 25   BUN 21 22 22   CREATININE 0.85 0.99 1.05  GLUCOSE 92 102* 106*  CALCIUM 8.4 8.5 8.4       09/15/2012 11:50 09/16/2012 05:20 09/18/2012 15:00 09/19/2012 05:00 09/20/2012 04:06  Sed Rate 75 (H) 67 (H) 38 (H) 66 (H) 60 (H)    Imaging/Diagnostic Tests: TEE 09/18/2012 - Left ventricle: The cavity size was normal. Wall thickness was normal.  Systolic function was normal. The estimated ejection fraction was in the range of 60% to 65%. - Aortic valve: No evidence of vegetation. - Mitral valve: Large 2.5cm diameter vegetation/ mass on the atrial surface of the anterior mitral leaflet with central cavity (Doppler flow within) leading to two separate mitral regurgitant jets (moderate grade) exiting from the mass itself. - Left atrium: No evidence of thrombus in the atrial cavity or appendage. - Right atrium: No evidence of thrombus in the atrial cavity or appendage. - Atrial septum: No defect or patent foramen ovale was identified. - Tricuspid valve: No evidence of vegetation. - Pulmonic valve: No evidence of vegetation. - Superior vena cava: The study excluded a thrombus. - Pericardium, extracardiac: A small, free-flowing pericardial effusion was identified along the right atrial free wall.  2D TTE 09/17/2012 - Mitral valve: There was a large, cystic (hypoechoic interior) mass on the lateral aspect of the annulus; the abnormality is new since the previous study; the appearance is consistent with vegetation and/or abscess. Moderate to severe regurgitation, with multiple jets directed eccentrically. - Pericardium, extracardiac: A small pericardial effusion was identified posterior to the heart. The fluid had no internal echoes.There was no evidence of hemodynamic compromise.  V/Q scan 12/28 IMPRESSION:  Low probability study for pulmonary embolus.  CXR 12/28 IMPRESSION:  Increasing bibasilar opacities bilaterally.  MRI Head 12/27 Several small areas of acute infarction involving the right  hemisphere including portions of the; right  occipital lobe, right  temporal lobe, right opercular region, posterior right frontal lobe  and right parietal lobe. Tiny small acute infarcts involving the  posterior left temporal lobe and left parietal - occipital  junction. Tiny acute infarcts left cerebellum and right cerebellar    tonsil.  This distribution of infarcts raises possibility of embolic  disease. As the patient has findings of diskitis of the cervical  spine as discussed below, septic emboli not entirely excluded.  Opacification right mastoid air cells and right middle ear as noted  Above.  MRA head 12/27 Branch vessel atherosclerotic type changes as noted above.  MRI Cervical Spine 12/27 C6-7 diskitis with spread of infection to the adjacent facet  joints, spinous process and interspinous ligaments/posterior  paraspinal musculature as discussed above. Epidural abscess  suspected although evaluation limited without contrast. This  contributes to spinal stenosis most prominent C6 level.  Cervical spondylotic changes most notable C3-4 level.   Plan: Ryan [name] is a 67 y.o. year old male with acute encephalopathy secondary to sepsis from multiple sources.    CV- Stable - Planned Heart cath today - Probable mitral valve replacement early next week  Pericarditis, Afib with RVR - Improving, Likely due to endocarditis rather than prolonged infection - Cardiology on board, greatly appreciate recommendations - Likely Pericarditis causing Afib, concern for purulence, afib currently rhythm controlled with amiodarone - TEE on 12/30 confirmed MV vegetation listed below and slight improvement of pericarditis  Acute Diastolic Heart failure - secondary to mitral regurgitation caused by large vegetation - Lasix 40 mg IV BID - Strict I/O, daily weights - negative 713mL, KVO fluids  ID - metastatic infection - ID on Board-   - Continue Ancef, likely will need 6-8 weeks, needs TEE  - Reccomendations greatly appreciated - WBC leveling, 11.0 on 12/30, 13.7 12/31, 14.8 on 11/2, 14.6 today - Blood culture from 12/30 during fever pending, No growth on previous from 12/26  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, likely source of emboli seen on MRI previously - ID, cardiology, and CT surgery following and  treating- appreciate management and reccs - Will follow along for likely need for surgical intervention  MSSA Bacteremia - Blood Cx 12/23 shows pan sensitive MSSA, Repeat from 12/26 NGTD, Repeated on 09/18/2012- pending - R wrist   - OR by Hand surgery on 12/26 for debridement- Cx with mod MSSA  - Hand surgery signed off - R MTP-   - Debridment and hardware removal on 12/26   - Ortho signed off - Continue to trend CBC and ESR  - WBC up to 13.7 from 11.0 1 day ago  - ESR increased 60 from 66 1day ago  - Hgb stable around 10 - Will consult wound care for dressing changes since ortho signed off.  - Picc line placed on 12/30, if blood cultures grow anything will consider replacing as it will ikely be seeded.   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 - Greatly appreciate input.   PAD/Ischemic limb:  - Vascular on board- appreciate reccomendations - concern on admission that LE were ischemic. Workup performed by vascular in ED which was positive for PAD on ABI testing. No reported claudication by pt.  - No heparin currently due to cerebral emboli - Vascular  plans to defer angio until acute issues resolved  Abdominal Pain - resolved today - DG abdomen with no significant findings - Lactic acid WNL - Continue to follow, no other interventions at this time  Acute Kidney Injury.  - Dehydration vs infection induced vs CKD.  - CR 1.4 on admission 0.85 today  - good UOP 1.6 L on 1/1 - BMP daily, continue to follow   Elevated Transaminases -  - Pt AST:ALT 2:1 consistent with alcoholic picture. EtOH on admission < 11  - on CIWA   Newly Dx DM II - A1C 6.5 on check  - Consider sensitive SSI if starts to develop hyperglycemia, Blood  glucose fine for now.   FEN/GI:  - Carb modified diet  - KVO NS - Strict I/O  Prophylaxis: SCD Disposition: Step down for now, Home with home health pending further workup and improvement  Kenn File, MD 09/21/2012, 7:19 AM

## 2012-09-21 NOTE — Progress Notes (Signed)
INFECTIOUS DISEASE PROGRESS NOTE  ID: Ryan [name] is a 67 y.o. 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  Groggy.   Abtx:  Anti-infectives     Start     Dose/Rate Route Frequency Ordered Stop   09/16/12 0800   fluconazole (DIFLUCAN) tablet 150 mg        150 mg Oral  Once 09/16/12 0619 09/16/12 1204   09/15/12 1400   ceFAZolin (ANCEF) IVPB 2 g/50 mL premix        2 g 100 mL/hr over 30 Minutes Intravenous 3 times per day 09/15/12 1110     09/13/12 1200   vancomycin (VANCOCIN) 750 mg in sodium chloride 0.9 % 150 mL IVPB  Status:  Discontinued        750 mg 150 mL/hr over 60 Minutes Intravenous Every 12 hours 09/13/12 0409 09/15/12 1110   09/13/12 0800   piperacillin-tazobactam (ZOSYN) IVPB 3.375 g  Status:  Discontinued        3.375 g 12.5 mL/hr over 240 Minutes Intravenous Every 8 hours 09/13/12 0409 09/15/12 1110   09/12/12 2300   vancomycin (VANCOCIN) IVPB 1000 mg/200 mL premix        1,000 mg 200 mL/hr over 60 Minutes Intravenous  Once 09/12/12 2253 09/13/12 0128   09/12/12 2300  piperacillin-tazobactam (ZOSYN) IVPB 3.375 g       3.375 g 12.5 mL/hr over 240 Minutes Intravenous  Once 09/12/12 2253 09/13/12 0023          Medications:  Scheduled:   . amiodarone  200 mg Oral BID  . aspirin  81 mg Oral Daily  . atorvastatin  40 mg Oral q1800  .  ceFAZolin (ANCEF) IV  2 g Intravenous Q8H  . colchicine  0.6 mg Oral BID  . folic acid  1 mg Oral Daily  . metoprolol tartrate  25 mg Oral Q6H  . multivitamin with minerals  1 tablet  Oral Daily  . sodium chloride  10-40 mL Intracatheter Q12H  . sodium chloride      . thiamine  100 mg Oral Daily    Objective: Vital signs in last 24 hours: Temp:  [97.9 F (36.6 C)-98.3 F (36.8 C)] 98.1 F (36.7 C) (01/02 1130) Pulse Rate:  [69-96] 96  (01/02 1158) Resp:  [11-23] 21  (01/02 1140) BP: (147-175)/(55-78) 168/78 mmHg (01/02 1130) SpO2:  [93 %-99 %] 98 % (01/02 1140) Weight:  [91.8 kg (202 lb 6.1 oz)] 91.8 kg (202 lb 6.1 oz) (01/02 1448)   General appearance: alert, cooperative and no distress Resp: clear to auscultation bilaterally Cardio: regular rate and rhythm GI: normal findings: bowel sounds normal and soft, non-tender Extremities: R wrist dressed, R foot dressed.   Lab Results  Basename 09/21/12 0410 09/20/12 0406  WBC 14.6* 14.8*  HGB 9.6* 9.7*  HCT 28.0* 28.1*  NA 136 138  K 3.5 3.6  CL 100 101  CO2 26 27  BUN 21 22  CREATININE 0.85 0.99  GLU -- --   Liver Panel No results found for this basename: PROT:2,ALBUMIN:2,AST:2,ALT:2,ALKPHOS:2,BILITOT:2,BILIDIR:2,IBILI:2 in the last 72 hours Sedimentation Rate  Basename 09/21/12 0410  ESRSEDRATE 57*   C-Reactive Protein No results found for this basename: CRP:2 in the last 72 hours  Microbiology: Recent Results (from the past 240 hour(s))  URINE CULTURE     Status: Normal   Collection Time   09/12/12  8:55 PM      Component Value Range Status Comment   Specimen Description URINE, CATHETERIZED   Final    Special Requests CX ADDED AT 2127 ON [id number]   Final    Culture  Setup Time 09/12/2012 22:38   Final    Colony Count >=100,000 COLONIES/ML   Final    Culture ESCHERICHIA COLI   Final    Report Status 09/14/2012 FINAL   Final    Organism ID, Bacteria ESCHERICHIA COLI   Final   GRAM STAIN     Status: Normal   Collection Time   09/12/12  9:36 PM      Component Value Range Status Comment   Specimen Description SYNOVIAL FLUID RIGHT WRIST   Final    Special Requests Normal   Final    Gram  Stain     Final    Value: ABUNDANT WBC PRESENT, PREDOMINANTLY PMN     FEW GRAM POSITIVE COCCI IN PAIRS IN CLUSTERS     Gram Stain Report Called to,Read Back By and Verified With: RN T. MITCHELL 09/12/12 [place] M.   Report Status 09/12/2012 FINAL   Final   BODY FLUID CULTURE     Status: Normal   Collection Time   09/12/12  9:36 PM      Component Value Range Status Comment   Specimen Description SYNOVIAL FLUID RIGHT WRIST   Final    Special Requests Normal   Final    Gram Stain     Final    Value: ABUNDANT WBC PRESENT, PREDOMINANTLY PMN     FEW GRAM POSITIVE COCCI IN PAIRS     IN CLUSTERS Performed at [facility] Gram Stain Report Called to,Read Back By and Verified With: Gram Stain Report Called to,Read Back By and Verified With: RN T.MITCHELL 09/12/12 [address] [facility] M   Culture     Final    Value: STAPHYLOCOCCUS AUREUS     Note: RIFAMPIN AND GENTAMICIN SHOULD NOT BE USED AS SINGLE DRUGS FOR TREATMENT OF STAPH INFECTIONS.   Report Status 09/16/2012 FINAL   Final    Organism ID, Bacteria STAPHYLOCOCCUS AUREUS   Final   CULTURE, BLOOD (ROUTINE X 2)     Status: Normal   Collection Time   09/12/12 10:45 PM      Component Value Range Status Comment   Specimen Description BLOOD ARM LEFT   Final    Special Requests BOTTLES DRAWN AEROBIC AND ANAEROBIC 10CC   Final    Culture  Setup Time 09/13/2012 04:15   Final    Culture     Final    Value: STAPHYLOCOCCUS AUREUS     Note: RIFAMPIN AND GENTAMICIN SHOULD NOT BE USED AS SINGLE DRUGS FOR TREATMENT OF STAPH INFECTIONS.     Note: Gram Stain Report Called to,Read Back By and Verified With: WHITNEY DAVIS@1518  ON [id number] BY [facility]   Report Status 09/15/2012 FINAL   Final    Organism ID, Bacteria STAPHYLOCOCCUS AUREUS   Final   CULTURE, BLOOD (ROUTINE X 2)     Status: Normal   Collection Time   09/12/12 10:45 PM      Component Value Range Status Comment   Specimen Description BLOOD  ARM RIGHT   Final    Special Requests BOTTLES DRAWN  AEROBIC ONLY 10CC   Final    Culture  Setup Time 09/13/2012 04:15   Final    Culture     Final    Value: STAPHYLOCOCCUS AUREUS     Note: SUSCEPTIBILITIES PERFORMED ON PREVIOUS CULTURE WITHIN THE LAST 5 DAYS.     Note: Gram Stain Report Called to,Read Back By and Verified With: WHITNEY DAVIS@1518  ON [id number] BY [facility]   Report Status 09/15/2012 FINAL   Final   MRSA PCR SCREENING     Status: Normal   Collection Time   09/13/12  3:44 AM      Component Value Range Status Comment   MRSA by PCR NEGATIVE  NEGATIVE Final   WOUND CULTURE     Status: Normal   Collection Time   09/13/12  9:12 AM      Component Value Range Status Comment   Specimen Description WOUND RIGHT FOOT   Final    Special Requests Normal   Final    Gram Stain     Final    Value: NO WBC SEEN     FEW SQUAMOUS EPITHELIAL CELLS PRESENT     RARE GRAM POSITIVE COCCI IN PAIRS   Culture     Final    Value: MODERATE STAPHYLOCOCCUS AUREUS     Note: RIFAMPIN AND GENTAMICIN SHOULD NOT BE USED AS SINGLE DRUGS FOR TREATMENT OF STAPH INFECTIONS.   Report Status 09/16/2012 FINAL   Final    Organism ID, Bacteria STAPHYLOCOCCUS AUREUS   Final   WOUND CULTURE     Status: Normal   Collection Time   09/14/12  2:04 PM      Component Value Range Status Comment   Specimen Description WOUND WRIST RIGHT   Final    Special Requests PATIENT ON FOLLOWING ZOSYN,VANCOMYCIN   Final    Gram Stain     Final    Value: FEW WBC PRESENT, PREDOMINANTLY PMN     NO SQUAMOUS EPITHELIAL CELLS SEEN     NO ORGANISMS SEEN   Culture     Final    Value: MODERATE STAPHYLOCOCCUS AUREUS     Note: RIFAMPIN AND GENTAMICIN SHOULD NOT BE USED AS SINGLE DRUGS FOR TREATMENT OF STAPH INFECTIONS.   Report Status 09/17/2012 FINAL   Final    Organism ID, Bacteria STAPHYLOCOCCUS AUREUS   Final   ANAEROBIC CULTURE     Status: Normal   Collection Time   09/14/12  2:04 PM      Component Value Range Status Comment   Specimen Description WOUND WRIST RIGHT   Final    Special  Requests PATIENT ON FOLLOWING ZOSYN,VANCOMYCIN   Final    Gram Stain     Final    Value: FEW WBC PRESENT, PREDOMINANTLY PMN     NO SQUAMOUS EPITHELIAL CELLS SEEN     NO ORGANISMS SEEN   Culture NO ANAEROBES ISOLATED   Final    Report Status 09/19/2012 FINAL   Final   CULTURE, BLOOD (ROUTINE X 2)     Status: Normal   Collection Time   09/14/12  6:30 PM      Component Value Range Status Comment   Specimen Description BLOOD LEFT FOOT   Final    Special Requests BOTTLES DRAWN AEROBIC ONLY 3CC   Final    Culture  Setup Time 09/14/2012 23:48   Final    Culture NO GROWTH 5 DAYS   Final  Report Status 09/20/2012 FINAL   Final   CULTURE, BLOOD (ROUTINE X 2)     Status: Normal (Preliminary result)   Collection Time   09/18/12  8:49 PM      Component Value Range Status Comment   Specimen Description BLOOD LEFT FOOT   Final    Special Requests BOTTLES DRAWN AEROBIC ONLY 1CC   Final    Culture  Setup Time 09/19/2012 05:09   Final    Culture     Final    Value:        BLOOD CULTURE RECEIVED NO GROWTH TO DATE CULTURE WILL BE HELD FOR 5 DAYS BEFORE ISSUING A FINAL NEGATIVE REPORT   Report Status PENDING   Incomplete   CULTURE, BLOOD (ROUTINE X 2)     Status: Normal (Preliminary result)   Collection Time   09/18/12  9:15 PM      Component Value Range Status Comment   Specimen Description BLOOD LEFT FOOT   Final    Special Requests BOTTLES DRAWN AEROBIC ONLY 6CC   Final    Culture  Setup Time 09/19/2012 05:09   Final    Culture     Final    Value:        BLOOD CULTURE RECEIVED NO GROWTH TO DATE CULTURE WILL BE HELD FOR 5 DAYS BEFORE ISSUING A FINAL NEGATIVE REPORT   Report Status PENDING   Incomplete     Studies/Results: No results found.   Assessment/Plan: MSSA sepsis  MV Endocarditis/Endocardial Abscess Epidural abscess/discitis [id number]  Embolic disease on MRI head  Pericarditis (cath today - for obstructive lesions) Septic Arthritis R wrist, s/p I & D 12-27 Septic R foot, s/p  hardware removal  E coli UTI (treated)  No change to ancef, continue high dose  Possible MVR 1-7 Total days of antibiotics 10 (ancef) Appears to be doing ok, will continue his ancef.  His repeat BCx 12-30 are NGTD.    Bobby Rumpf Infectious Diseases [id number] 09/21/2012, 2:55 PM   LOS: 9 days

## 2012-09-21 NOTE — Progress Notes (Signed)
Clinical Education officer, museum (CSW) observed that PT recommending CIR however pt insurance is unlikely to approve CIR. CSW visited pt room however pt and family member requested that [place] return as pt is watching a healthcare video. CSW to return later.  Hunt Oris, MSW, [place]

## 2012-09-21 NOTE — H&P (Signed)
Chart reviewed. The patient has been stable overnight. We are planning left and right heart catheterization later today to define coronary anatomy which will help guide the surgical procedure that the patient will require. The procedure and its risks were rediscussed with the patient and family. Risks include stroke, death, myocardial infarction, bleeding, allergy, kidney injury, among others. These risks are accepted by the patient who is willing to proceed.

## 2012-09-21 NOTE — Clinical Social Work Psychosocial (Signed)
     Clinical Social Work Department BRIEF PSYCHOSOCIAL ASSESSMENT 09/21/2012  Patient:  Ryan [name],Ryan [name]     Account Number:  [id number]     Admit date:  09/12/2012  Clinical Social Worker:  Valda Lamb  Date/Time:  09/21/2012 11:37 AM  Referred by:  CSW  Date Referred:  09/21/2012 Referred for  SNF Placement   Other Referral:   Interview type:  Patient Other interview type:    PSYCHOSOCIAL DATA Living Status:  WIFE Admitted from facility:   Level of care:   Primary support name:  Ryan [name] [phone] (c) [phone] Primary support relationship to patient:  SPOUSE Degree of support available:   Pt spouse is present in the room and is actively involved in pt care.    CURRENT CONCERNS Current Concerns  Post-Acute Placement   Other Concerns:    SOCIAL WORK ASSESSMENT / PLAN CSW observed that PT recommended CIR for pt.    CSW reviewed CIR notes and observed that pt insurance is unlikely to approve CIR placement. CSW visited pt room and intriduced herself and role. CSW informed pt of PT recommendations for rehab at discharged and explore pt thoughts regarding rehab. Pt and wife stated they are agreeable to rehab in [facility] [place]. Pt has given consent to do a SNF search for [facility], where he lives with his wife.    CSW to do a SNF search.   Assessment/plan status:  Referral to [employer] Other assessment/ plan:   Information/referral to community resources:   CSW provided pt wife with a SNF list for [facility]. CSW also provided pt spouse with CSW contact information.    PATIENTS/FAMILYS RESPONSE TO PLAN OF CARE: Pt laying in bed alert and oriented. Pt spouse and 3 sisters also present in pt roon. CSW given consent to speak to discuss dc planning while visitors presents. Pt wife appears actively involved and facilitates with dc planning.

## 2012-09-21 NOTE — Progress Notes (Signed)
CSW observed that pt transferred to 2900. CSW notified unit CSW who will facilitate with dc plans. This CSW signing off.  Hunt Oris, MSW, [place]

## 2012-09-21 NOTE — Progress Notes (Signed)
FMTS Attending Daily Note: Helaina Stefano MD [phone] pager office [phone] I  have seen and examined this patient, reviewed their chart. I have discussed this patient with the resident. I agree with the resident's findings, assessment and care plan. 

## 2012-09-22 ENCOUNTER — Inpatient Hospital Stay (HOSPITAL_COMMUNITY): Payer: Medicare PPO

## 2012-09-22 DIAGNOSIS — I330 Acute and subacute infective endocarditis: Secondary | ICD-10-CM

## 2012-09-22 DIAGNOSIS — I059 Rheumatic mitral valve disease, unspecified: Secondary | ICD-10-CM

## 2012-09-22 LAB — CBC
HCT: 27.3 % — ABNORMAL LOW (ref 39.0–52.0)
Hemoglobin: 9.4 g/dL — ABNORMAL LOW (ref 13.0–17.0)
MCH: 22.4 pg — ABNORMAL LOW (ref 26.0–34.0)
MCHC: 34.4 g/dL (ref 30.0–36.0)
MCV: 65 fL — ABNORMAL LOW (ref 78.0–100.0)
Platelets: 427 10*3/uL — ABNORMAL HIGH (ref 150–400)
RBC: 4.2 MIL/uL — ABNORMAL LOW (ref 4.22–5.81)
RDW: 15.5 % (ref 11.5–15.5)
WBC: 14.6 10*3/uL — ABNORMAL HIGH (ref 4.0–10.5)

## 2012-09-22 LAB — BASIC METABOLIC PANEL
BUN: 18 mg/dL (ref 6–23)
CO2: 25 mEq/L (ref 19–32)
Calcium: 8.6 mg/dL (ref 8.4–10.5)
Chloride: 104 mEq/L (ref 96–112)
Creatinine, Ser: 0.85 mg/dL (ref 0.50–1.35)
GFR calc Af Amer: >90 mL/min (ref >90)
GFR calc non Af Amer: 88 mL/min — ABNORMAL LOW (ref >90)
Glucose, Bld: 87 mg/dL (ref 70–99)
Potassium: 4.2 mEq/L (ref 3.5–5.1)
Sodium: 140 mEq/L (ref 135–145)

## 2012-09-22 LAB — SEDIMENTATION RATE: Sed Rate: 57 mm/hr — ABNORMAL HIGH (ref 0–16)

## 2012-09-22 MED ORDER — FUROSEMIDE 40 MG PO TABS
40.0000 mg | ORAL_TABLET | Freq: Every day | ORAL | Status: DC
  Administered 2012-09-22 – 2012-09-25 (×4): 40 mg via ORAL
  Filled 2012-09-22 (×5): qty 1

## 2012-09-22 MED ORDER — ALBUTEROL SULFATE (5 MG/ML) 0.5% IN NEBU
2.5000 mg | INHALATION_SOLUTION | Freq: Once | RESPIRATORY_TRACT | Status: AC
  Administered 2012-09-22: 2.5 mg via RESPIRATORY_TRACT

## 2012-09-22 MED ORDER — SODIUM CHLORIDE 0.9 % IV SOLN
INTRAVENOUS | Status: DC
Start: 2012-09-23 — End: 2012-09-26
  Administered 2012-09-23: 20 mL via INTRAVENOUS

## 2012-09-22 NOTE — Progress Notes (Signed)
Physical Therapy Treatment Patient Details Name: Ryan [name] MRN: [id number] DOB: 02-27-1946 Today's Date: 09/22/2012 Time: [id number] PT Time Calculation (min): 46 min  PT Assessment / Plan / Recommendation Comments on Treatment Session  67 y.o. year old male with acute encephalopathy (now resolved) secondary to sepsis from multiple sources, s/p I&D of R wrist and R MTP lesion (with HW removal), Multiple small R hemisphere infarcts that are likely septic emboli, C6-T1 Diskitis, pericarditis,  Endocarditis, and E. Coli UTI.  Continues to make great progress with therapies.     Follow Up Recommendations  CIR     Does the patient have the potential to tolerate intense rehabilitation     Barriers to Discharge        Equipment Recommendations   (tbd at next facility)    Recommendations for Other Services    Frequency Min 3X/week   Plan Discharge plan remains appropriate;Frequency remains appropriate    Precautions / Restrictions Precautions Precautions: Fall Restrictions Weight Bearing Restrictions: Yes RUE Weight Bearing: Non weight bearing RLE Weight Bearing: Weight bearing as tolerated       Mobility  Bed Mobility Bed Mobility: Supine to Sit Supine to Sit: 1: +2 Total assist Supine to Sit: Patient Percentage: 60% Details for Bed Mobility Assistance: assist for lower extremities to get off bed slowly as well as trunk support posteriorly to rise from bed surface and scoot hips EOB; pt needing moderate sequencing cues for body sequencing Transfers Sit to Stand: 1: +2 Total assist;With upper extremity assist (Left UE assist) Sit to Stand: Patient Percentage: 70% Stand to Sit: 1: +2 Total assist;To bed;To chair/3-in-1 Stand to Sit: Patient Percentage: 70% Details for Transfer Assistance: sequencing and faciliatory cues for tall posture and engagement of tunk for anterior translation of trunk over BOS as well as hip/trunk and knee extension for tall  posture Ambulation/Gait Ambulation/Gait Assistance: 1: +2 Total assist Ambulation/Gait: Patient Percentage: 60% Assistive device: 1 person hand held assist Ambulation/Gait Assistance Details: Attempted forward ambulation but pt having too much difficulty weight shifting onto the right lower extremity so performed SPT (at least 10 smal pivotal side and posterior steps) that improved with practice; pt pushing down heavily into his LUE for stability with HHA from therapist and facilitation at hips for stability as well as verbal sequencing cues for feet    Exercises General Exercises - Lower Extremity Ankle Circles/Pumps: AROM;Both;10 reps;Supine Quad Sets: AROM;Both;10 reps;Supine Shoulder Exercises Digit Composite Flexion: AAROM;AROM;10 reps;Seated (~40%`) Composite Extension: AROM;AAROM;Sidelying;10 reps (~60%) Other Exercises Other Exercises: Pt. instructed to perform active and PROM digit flexio and extension 10 reps 4-6 x/day.  He verbalized understanding     PT Goals Acute Rehab PT Goals PT Goal: Supine/Side to Sit - Progress: Progressing toward goal PT Goal: Sit to Stand - Progress: Progressing toward goal PT Goal: Stand to Sit - Progress: Progressing toward goal PT Transfer Goal: Bed to Chair/Chair to Bed - Progress: Progressing toward goal PT Goal: Stand - Progress: Progressing toward goal PT Goal: Ambulate - Progress: Progressing toward goal  Visit Information  Last PT Received On: 09/22/12 Assistance Needed: +2    Subjective Data  Subjective: I feel like Im going to fall (pt standing up).    Cognition  Overall Cognitive Status: Impaired Area of Impairment: Memory;Awareness of deficits Arousal/Alertness: Awake/alert Orientation Level: Oriented X4 / Intact Behavior During Session: [facility] for tasks performed Memory Deficits: Min cues to recall events of morning    Balance  Balance Balance Assessed: Yes Static Standing Balance Static  Standing - Level of Assistance: 4:  Min assist Static Standing - Comment/# of Minutes: LUE supported by therapist with mod-min faciliatory cues for tall posture with hip/trunk extension; pt stood at least 5 minutes initially needing more support for anterior translation of weight for more midline posture; does stand with a bend right knee and has difficulty extending due to pain and arthritis  End of Session PT - End of Session Equipment Utilized During Treatment: Gait belt Activity Tolerance: Patient tolerated treatment well;Patient limited by fatigue Patient left: in chair;with call bell/phone within reach Nurse Communication: Mobility status   GP     [place] 09/22/2012, 3:10 PM

## 2012-09-22 NOTE — Progress Notes (Signed)
INFECTIOUS DISEASE PROGRESS NOTE  ID: Ryan [name] is a 67 y.o. 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  Subjective: Up walking with PT  Abtx:  Anti-infectives     Start     Dose/Rate Route Frequency Ordered Stop   09/16/12 0800   fluconazole (DIFLUCAN) tablet 150 mg        150 mg Oral  Once 09/16/12 0619 09/16/12 1204   09/15/12 1400   ceFAZolin (ANCEF) IVPB 2 g/50 mL premix        2 g 100 mL/hr over 30 Minutes Intravenous 3 times per day 09/15/12 1110     09/13/12 1200   vancomycin (VANCOCIN) 750 mg in sodium chloride 0.9 % 150 mL IVPB  Status:  Discontinued        750 mg 150 mL/hr over 60 Minutes Intravenous Every 12 hours 09/13/12 0409 09/15/12 1110   09/13/12 0800   piperacillin-tazobactam (ZOSYN) IVPB 3.375 g  Status:  Discontinued        3.375 g 12.5 mL/hr over 240 Minutes Intravenous Every 8 hours 09/13/12 0409 09/15/12 1110   09/12/12 2300   vancomycin (VANCOCIN) IVPB 1000 mg/200 mL premix        1,000 mg 200 mL/hr over 60 Minutes Intravenous  Once 09/12/12 2253 09/13/12 0128   09/12/12 2300  piperacillin-tazobactam (ZOSYN) IVPB 3.375 g       3.375 g 12.5 mL/hr over 240 Minutes Intravenous  Once 09/12/12 2253 09/13/12 0023          Medications:  Scheduled:   . amiodarone  200 mg Oral BID  . aspirin  81 mg Oral Daily  . atorvastatin  40 mg Oral q1800  .  ceFAZolin (ANCEF) IV  2 g Intravenous Q8H  . colchicine  0.6 mg Oral BID  . folic acid  1 mg Oral Daily  . furosemide  40 mg Oral Daily  . metoprolol tartrate  25 mg Oral Q6H  .  multivitamin with minerals  1 tablet Oral Daily  . sodium chloride  10-40 mL Intracatheter Q12H  . thiamine  100 mg Oral Daily    Objective: Vital signs in last 24 hours: Temp:  [98.1 F (36.7 C)-99.3 F (37.4 C)] 98.7 F (37.1 C) (01/03 0741) Pulse Rate:  [69-96] 73  (01/03 0741) Resp:  [1-21] 15  (01/03 0741) BP: (149-178)/(50-78) 167/73 mmHg (01/03 0741) SpO2:  [92 %-99 %] 96 % (01/03 0741) Weight:  [202 lb 6.1 oz (91.8 kg)] 202 lb 6.1 oz (91.8 kg) (01/02 1448)   General appearance: alert, cooperative and no distress Resp: clear to auscultation bilaterally Cardio: tachycardic GI: normal findings: bowel sounds normal and soft, non-tender  Lab Results  Basename 09/22/12 0500 09/21/12 0410  WBC 14.6* 14.6*  HGB 9.4* 9.6*  HCT 27.3* 28.0*  NA 140 136  K 4.2 3.5  CL 104 100  CO2 25 26  BUN 18 21  CREATININE 0.85 0.85  GLU -- --   Liver Panel No results found for this basename: PROT:2,ALBUMIN:2,AST:2,ALT:2,ALKPHOS:2,BILITOT:2,BILIDIR:2,IBILI:2 in the last 72 hours Sedimentation Rate  Basename 09/22/12 0500  ESRSEDRATE 57*   C-Reactive Protein No results  found for this basename: CRP:2 in the last 72 hours  Microbiology: Recent Results (from the past 240 hour(s))  URINE CULTURE     Status: Normal   Collection Time   09/12/12  8:55 PM      Component Value Range Status Comment   Specimen Description URINE, CATHETERIZED   Final    Special Requests CX ADDED AT 2127 ON [id number]   Final    Culture  Setup Time 09/12/2012 22:38   Final    Colony Count >=100,000 COLONIES/ML   Final    Culture ESCHERICHIA COLI   Final    Report Status 09/14/2012 FINAL   Final    Organism ID, Bacteria ESCHERICHIA COLI   Final   GRAM STAIN     Status: Normal   Collection Time   09/12/12  9:36 PM      Component Value Range Status Comment   Specimen Description SYNOVIAL FLUID RIGHT WRIST   Final    Special Requests Normal   Final    Gram Stain     Final    Value: ABUNDANT WBC PRESENT,  PREDOMINANTLY PMN     FEW GRAM POSITIVE COCCI IN PAIRS IN CLUSTERS     Gram Stain Report Called to,Read Back By and Verified With: RN T. MITCHELL 09/12/12 [place]   Report Status 09/12/2012 FINAL   Final   BODY FLUID CULTURE     Status: Normal   Collection Time   09/12/12  9:36 PM      Component Value Range Status Comment   Specimen Description SYNOVIAL FLUID RIGHT WRIST   Final    Special Requests Normal   Final    Gram Stain     Final    Value: ABUNDANT WBC PRESENT, PREDOMINANTLY PMN     FEW GRAM POSITIVE COCCI IN PAIRS     IN CLUSTERS Performed at [facility] Gram Stain Report Called to,Read Back By and Verified With: Gram Stain Report Called to,Read Back By and Verified With: RN T.MITCHELL 09/12/12 [address] [facility]   Culture     Final    Value: STAPHYLOCOCCUS AUREUS     Note: RIFAMPIN AND GENTAMICIN SHOULD NOT BE USED AS SINGLE DRUGS FOR TREATMENT OF STAPH INFECTIONS.   Report Status 09/16/2012 FINAL   Final    Organism ID, Bacteria STAPHYLOCOCCUS AUREUS   Final   CULTURE, BLOOD (ROUTINE X 2)     Status: Normal   Collection Time   09/12/12 10:45 PM      Component Value Range Status Comment   Specimen Description BLOOD ARM LEFT   Final    Special Requests BOTTLES DRAWN AEROBIC AND ANAEROBIC 10CC   Final    Culture  Setup Time 09/13/2012 04:15   Final    Culture     Final    Value: STAPHYLOCOCCUS AUREUS     Note: RIFAMPIN AND GENTAMICIN SHOULD NOT BE USED AS SINGLE DRUGS FOR TREATMENT OF STAPH INFECTIONS.     Note: Gram Stain Report Called to,Read Back By and Verified With: WHITNEY DAVIS@1518  ON [id number] BY [facility] [place]   Report Status 09/15/2012 FINAL   Final    Organism ID, Bacteria STAPHYLOCOCCUS AUREUS   Final   CULTURE, BLOOD (ROUTINE X 2)     Status: Normal   Collection Time   09/12/12 10:45 PM      Component Value Range Status Comment   Specimen Description BLOOD ARM RIGHT   Final    Special Requests BOTTLES DRAWN  AEROBIC ONLY 10CC   Final    Culture  Setup Time  09/13/2012 04:15   Final    Culture     Final    Value: STAPHYLOCOCCUS AUREUS     Note: SUSCEPTIBILITIES PERFORMED ON PREVIOUS CULTURE WITHIN THE LAST 5 DAYS.     Note: Gram Stain Report Called to,Read Back By and Verified With: WHITNEY DAVIS@1518  ON 122513 BY [facility]   Report Status 09/15/2012 FINAL   Final   MRSA PCR SCREENING     Status: Normal   Collection Time   09/13/12  3:44 AM      Component Value Range Status Comment   MRSA by PCR NEGATIVE  NEGATIVE Final   WOUND CULTURE     Status: Normal   Collection Time   09/13/12  9:12 AM      Component Value Range Status Comment   Specimen Description WOUND RIGHT FOOT   Final    Special Requests Normal   Final    Gram Stain     Final    Value: NO WBC SEEN     FEW SQUAMOUS EPITHELIAL CELLS PRESENT     RARE GRAM POSITIVE COCCI IN PAIRS   Culture     Final    Value: MODERATE STAPHYLOCOCCUS AUREUS     Note: RIFAMPIN AND GENTAMICIN SHOULD NOT BE USED AS SINGLE DRUGS FOR TREATMENT OF STAPH INFECTIONS.   Report Status 09/16/2012 FINAL   Final    Organism ID, Bacteria STAPHYLOCOCCUS AUREUS   Final   WOUND CULTURE     Status: Normal   Collection Time   09/14/12  2:04 PM      Component Value Range Status Comment   Specimen Description WOUND WRIST RIGHT   Final    Special Requests PATIENT ON FOLLOWING ZOSYN,VANCOMYCIN   Final    Gram Stain     Final    Value: FEW WBC PRESENT, PREDOMINANTLY PMN     NO SQUAMOUS EPITHELIAL CELLS SEEN     NO ORGANISMS SEEN   Culture     Final    Value: MODERATE STAPHYLOCOCCUS AUREUS     Note: RIFAMPIN AND GENTAMICIN SHOULD NOT BE USED AS SINGLE DRUGS FOR TREATMENT OF STAPH INFECTIONS.   Report Status 09/17/2012 FINAL   Final    Organism ID, Bacteria STAPHYLOCOCCUS AUREUS   Final   ANAEROBIC CULTURE     Status: Normal   Collection Time   09/14/12  2:04 PM      Component Value Range Status Comment   Specimen Description WOUND WRIST RIGHT   Final    Special Requests PATIENT ON FOLLOWING ZOSYN,VANCOMYCIN    Final    Gram Stain     Final    Value: FEW WBC PRESENT, PREDOMINANTLY PMN     NO SQUAMOUS EPITHELIAL CELLS SEEN     NO ORGANISMS SEEN   Culture NO ANAEROBES ISOLATED   Final    Report Status 09/19/2012 FINAL   Final   CULTURE, BLOOD (ROUTINE X 2)     Status: Normal   Collection Time   09/14/12  6:30 PM      Component Value Range Status Comment   Specimen Description BLOOD LEFT FOOT   Final    Special Requests BOTTLES DRAWN AEROBIC ONLY 3CC   Final    Culture  Setup Time 09/14/2012 23:48   Final    Culture NO GROWTH 5 DAYS   Final    Report Status 09/20/2012 FINAL   Final   CULTURE,  BLOOD (ROUTINE X 2)     Status: Normal (Preliminary result)   Collection Time   09/18/12  8:49 PM      Component Value Range Status Comment   Specimen Description BLOOD LEFT FOOT   Final    Special Requests BOTTLES DRAWN AEROBIC ONLY 1CC   Final    Culture  Setup Time 09/19/2012 05:09   Final    Culture     Final    Value:        BLOOD CULTURE RECEIVED NO GROWTH TO DATE CULTURE WILL BE HELD FOR 5 DAYS BEFORE ISSUING A FINAL NEGATIVE REPORT   Report Status PENDING   Incomplete   CULTURE, BLOOD (ROUTINE X 2)     Status: Normal (Preliminary result)   Collection Time   09/18/12  9:15 PM      Component Value Range Status Comment   Specimen Description BLOOD LEFT FOOT   Final    Special Requests BOTTLES DRAWN AEROBIC ONLY 6CC   Final    Culture  Setup Time 09/19/2012 05:09   Final    Culture     Final    Value:        BLOOD CULTURE RECEIVED NO GROWTH TO DATE CULTURE WILL BE HELD FOR 5 DAYS BEFORE ISSUING A FINAL NEGATIVE REPORT   Report Status PENDING   Incomplete   MRSA PCR SCREENING     Status: Normal   Collection Time   09/21/12  2:45 PM      Component Value Range Status Comment   MRSA by PCR NEGATIVE  NEGATIVE Final     Studies/Results: No results found.   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  His repeat BCx 12-30 are NGTD.  Available if questions over w/e   Bobby Rumpf Infectious Diseases [id number] 09/22/2012, 11:03 AM   LOS: 10 days

## 2012-09-22 NOTE — Progress Notes (Signed)
FMTS Attending Daily Note: Hassell Patras MD [phone] pager office [phone] I  have seen and examined this patient, reviewed their chart. I have discussed this patient with the resident. I agree with the resident's findings, assessment and care plan. 

## 2012-09-22 NOTE — Progress Notes (Signed)
Neuro stable,will f/u at request

## 2012-09-22 NOTE — Progress Notes (Signed)
Patient Name: Ryan [name] Date of Encounter: 09/22/2012    SUBJECTIVE: The patient had a reasonable night. He had mild dyspnea. I spoke with the resident assuming care for the patient. We discussed placing him on 40 mg of Lasix IV per day. He denies chest pain. He has not had chills or fever. TELEMETRY:  Normal sinus rhythm.: Filed Vitals:   09/22/12 0741 09/22/12 1000 09/22/12 1156 09/22/12 1200  BP: 167/73 174/70 156/80 156/84  Pulse: 73 72 83 86  Temp: 98.7 F (37.1 C)  97.8 F (36.6 C)   TempSrc: Oral  Oral   Resp: 15 20 18 6   Height:      Weight:      SpO2: 96% 93% 96% 97%    Intake/Output Summary (Last 24 hours) at 09/22/12 1555 Last data filed at 09/22/12 1405  Gross per 24 hour  Intake    450 ml  Output    801 ml  Net   -351 ml    LABS: Basic Metabolic Panel:  Basename 09/22/12 0500 09/21/12 0410  NA 140 136  K 4.2 3.5  CL 104 100  CO2 25 26  GLUCOSE 87 92  BUN 18 21  CREATININE 0.85 0.85  CALCIUM 8.6 8.4  MG -- --  PHOS -- --   CBC:  Basename 09/22/12 0500 09/21/12 0410  WBC 14.6* 14.6*  NEUTROABS -- --  HGB 9.4* 9.6*  HCT 27.3* 28.0*  MCV 65.0* 64.8*  PLT 427* 398   Radiology/Studies:  No new data.  Physical Exam: Blood pressure 156/84, pulse 86, temperature 97.8 F (36.6 C), temperature source Oral, resp. rate 6, height 6' (1.829 m), weight 91.8 kg (202 lb 6.1 oz), SpO2 97.00%. Weight change:    2 of 6 holosystolic murmur at the apex and into the left axilla. S4 gallop is audible. 2 component rub is heard.  Lungs are clear the basis. Extremities reveal no edema   ASSESSMENT:  1. 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 Significant mitral regurgitation with diastolic heart failure, stable  3. Acute diastolic heart failure improved with diuresis  4. Pericarditis due to problem #1  5. Multiple CNS emboli  Plan:  1. Await planned surgery per Dr. Cyndia Bent, TCT  S.  Signed, [name] 09/22/2012, 3:55 PM

## 2012-09-22 NOTE — Progress Notes (Signed)
Physical Therapy Wound Treatment Patient Details  Name: Ryan [name] MRN: [id number] Date of Birth: [birth year]  Today's Date: 09/22/2012 Time: [id number] Time Calculation (min): 42 min  Subjective  Subjective: Pt agreeable to hydrotherapy today Patient and Family Stated Goals: heal wound Date of Onset: 09/15/12 Prior Treatments: I&D  Pain Score: Pain Score:   4    Wound Therapy - Assess/Plan/Recommendations Wound Therapy - Clinical Statement: Pt s/p right wrist I & D 09/15/12. Upon reassessment, wound bed is free of necrotic tissue and drainage was NOT purulent. Hydrotherapy ordered for every other day.   Wound Assessment  Wound 09/13/12 Foot Right non healing  wound (Active)  Site / Wound Assessment Pink;Yellow 09/14/2012  8:00 AM  % Wound base Red or Granulating 90% 09/14/2012  8:00 AM  % Wound base Yellow 100% 09/14/2012  8:00 AM  Peri-wound Assessment Intact 09/14/2012  8:00 AM  Margins Unattacted edges (unapproximated) 09/14/2012  8:00 AM  Closure None 09/14/2012  8:00 AM  Drainage Amount None 09/14/2012  8:00 AM  Non-staged Wound Description Partial thickness 09/14/2012  8:00 AM  Dressing Type Gauze (Comment) 09/14/2012  8:00 AM  Dressing Status Clean;Dry;Intact 09/14/2012  8:00 AM     Wound 09/16/12 Other (Comment) Wrist Right;Other (Comment) Right wrist (Active)  Site / Wound Assessment Red;Clean;Other (Comment) 09/22/2012  8:00 AM  % Wound base Red or Granulating 50% 09/22/2012  8:00 AM  % Wound base Other (Comment) 50% 09/22/2012  8:00 AM  Peri-wound Assessment Intact 09/22/2012  8:00 AM  Wound Length (cm) 5 cm 09/22/2012  8:00 AM  Wound Width (cm) 1.2 cm 09/22/2012  8:00 AM  Wound Depth (cm) 0.2 cm 09/22/2012  8:00 AM  Undermining (cm) @1 :00=2.6cm; @3 :00=1.0; @9 :00=1.1 09/16/2012 12:16 PM  Margins Unattacted edges (unapproximated) 09/22/2012  8:00 AM  Closure Sutures 09/22/2012  8:00 AM  Drainage Amount Minimal 09/22/2012  8:00 AM  Drainage Description Serosanguineous;No odor  09/22/2012  8:00 AM  Non-staged Wound Description Full thickness 09/22/2012  8:00 AM  Treatment Hydrotherapy (Pulse lavage) 09/22/2012  8:00 AM  Dressing Type Gauze (Comment);Compression wrap;Moist to moist;Other (Comment);Impregnated gauze (bismuth) 09/22/2012  8:00 AM  Dressing Changed Changed 09/22/2012  8:00 AM  Dressing Status Clean;Dry;Intact 09/22/2012  8:00 AM     Incision 09/14/12 Arm Right (Active)  Drainage Amount None 09/21/2012  8:00 AM  Dressing Type Compression wrap;Gauze (Comment) 09/21/2012  8:00 AM  Dressing Clean;Dry;Intact;Other (Comment) 09/21/2012  7:28 PM     Incision 09/14/12 Foot Right (Active)  Site / Wound Assessment Clean;Dry 09/21/2012  7:28 PM  Margins Attached edges (approximated) 09/17/2012  8:00 PM  Closure Sutures 09/16/2012  8:00 AM  Drainage Amount None 09/16/2012  8:00 AM  Treatment Cleansed 09/15/2012 11:00 AM  Dressing Type Gauze (Comment) 09/21/2012  7:28 PM  Dressing Clean;Dry;Intact 09/21/2012  7:28 PM   Hydrotherapy Pulsed lavage therapy - wound location: Rt dorsal wrist Pulsed Lavage with Suction (psi): 4 psi Pulsed Lavage with Suction - Normal Saline Used: 1000 mL Pulsed Lavage Tip: Tip with splash shield   Wound Assessment and Plan  Wound Therapy - Assess/Plan/Recommendations Wound Therapy - Clinical Statement: Pt s/p right wrist I & D 09/15/12. Upon reassessment, wound bed is free of necrotic tissue and drainage was NOT purulent. Hydrotherapy ordered for every other day. At this time recommend d/c hydrotherapy and continue with dressing changes through nsg. Will follow-up on 09/25/12 if indicated. Wound Therapy - Functional Problem List: Increased pain causing decreased use of right UE. Factors Delaying/Impairing Wound Healing: Infection -  systemic/local;Immobility Hydrotherapy Plan: Debridement;Dressing change;Patient/family education;Pulsatile lavage with suction Wound Therapy - Frequency: 3X / week Wound Therapy - Follow Up Recommendations: Home health  RN Wound Plan: rec d/c hydrotherapy  Wound Therapy Goals- Improve the function of patient's integumentary system by progressing the wound(s) through the phases of wound healing (inflammation - proliferation - remodeling) by:    Goals will be updated until maximal potential achieved or discharge criteria met.  Discharge criteria: when goals achieved, discharge from hospital, MD decision/surgical intervention, no progress towards goals, refusal/missing three consecutive treatments without notification or medical reason.  GP      Alben Deeds, PT DPT  [phone]  Duncan Dull 09/22/2012, 9:30 AM

## 2012-09-22 NOTE — Progress Notes (Signed)
Family Medicine Teaching Service Daily Progress Note Service Page: [phone]  Patient Assessment: 67 y.o. year old male with acute encephalopathy (now resolved) secondary to sepsis from multiple sources, s/p I&D of R wrist and R MTP lesion (with HW removal), Multiple small R hemisphere infarcts that are likely septic emboli, C6-T1 Diskitis, pericarditis,  Endocarditis, and E. Coli UTI.  .  Subjective:  Feeling ok today,  Only complaining of some mild pain where the catheter was inserted. Chest pain, abdominal pain resolved currently. No new wekness. Occasional SOB.   Objective: Temp:  [98.1 F (36.7 C)-99.3 F (37.4 C)] 98.6 F (37 C) (01/03 0359) Pulse Rate:  [71-96] 96  (01/02 1158) Resp:  [18-21] 21  (01/02 1140) BP: (160-168)/(55-78) 168/78 mmHg (01/02 1130) SpO2:  [97 %-98 %] 98 % (01/02 1140) Weight:  [202 lb 6.1 oz (91.8 kg)] 202 lb 6.1 oz (91.8 kg) (01/02 1448)  Exam: Gen: NAD, alert, cooperative with exam, lying in bed HEENT: NCAT, EOMI, MMM, lips dry and cracked.  CV: RRR, no murmur appreciated today Resp: CTABL, no wheezes, non-labored Abd: soft, mild RUQ tenderness to palpation, BS+ Ext: No edema, warm, R foot wrapped in clean ACE bandage, SCDs in place Neuro: Alert and oriented times 3, face symmetric, speaks easily, No gross deficits  I have reviewed the patient's medications, labs, imaging, and diagnostic testing.  Notable results are summarized below.  CBC BMET   Lab 09/22/12 0500 09/21/12 0410 09/20/12 0406  WBC 14.6* 14.6* 14.8*  HGB 9.4* 9.6* 9.7*  HCT 27.3* 28.0* 28.1*  PLT 427* 398 393    Lab 09/22/12 0500 09/21/12 0410 09/20/12 0406  NA 140 136 138  K 4.2 3.5 3.6  CL 104 100 101  CO2 25 26 27   BUN 18 21 22   CREATININE 0.85 0.85 0.99  GLUCOSE 87 92 102*  CALCIUM 8.6 8.4 8.5       09/15/2012 11:50 09/16/2012 05:20 09/18/2012 15:00 09/19/2012 05:00 09/20/2012 04:06  Sed Rate 75 (H) 67 (H) 38 (H) 66 (H) 60 (H)    Imaging/Diagnostic Tests: TEE  09/18/2012 - Left ventricle: The cavity size was normal. Wall thickness was normal. Systolic function was normal. The estimated ejection fraction was in the range of 60% to 65%. - Aortic valve: No evidence of vegetation. - Mitral valve: Large 2.5cm diameter vegetation/ mass on the atrial surface of the anterior mitral leaflet with central cavity (Doppler flow within) leading to two separate mitral regurgitant jets (moderate grade) exiting from the mass itself. - Left atrium: No evidence of thrombus in the atrial cavity or appendage. - Right atrium: No evidence of thrombus in the atrial cavity or appendage. - Atrial septum: No defect or patent foramen ovale was identified. - Tricuspid valve: No evidence of vegetation. - Pulmonic valve: No evidence of vegetation. - Superior vena cava: The study excluded a thrombus. - Pericardium, extracardiac: A small, free-flowing pericardial effusion was identified along the right atrial free wall.  2D TTE 09/17/2012 - Mitral valve: There was a large, cystic (hypoechoic interior) mass on the lateral aspect of the annulus; the abnormality is new since the previous study; the appearance is consistent with vegetation and/or abscess. Moderate to severe regurgitation, with multiple jets directed eccentrically. - Pericardium, extracardiac: A small pericardial effusion was identified posterior to the heart. The fluid had no internal echoes.There was no evidence of hemodynamic compromise.  V/Q scan 12/28 IMPRESSION:  Low probability study for pulmonary embolus.  CXR 12/28 IMPRESSION:  Increasing bibasilar opacities bilaterally.  MRI Head 12/27 Several small areas of acute infarction involving the right  hemisphere including portions of the; right occipital lobe, right  temporal lobe, right opercular region, posterior right frontal lobe  and right parietal lobe. Tiny small acute infarcts involving the  posterior left temporal lobe and left  parietal - occipital  junction. Tiny acute infarcts left cerebellum and right cerebellar  tonsil.  This distribution of infarcts raises possibility of embolic  disease. As the patient has findings of diskitis of the cervical  spine as discussed below, septic emboli not entirely excluded.  Opacification right mastoid air cells and right middle ear as noted  Above.  MRA head 12/27 Branch vessel atherosclerotic type changes as noted above.  MRI Cervical Spine 12/27 C6-7 diskitis with spread of infection to the adjacent facet  joints, spinous process and interspinous ligaments/posterior  paraspinal musculature as discussed above. Epidural abscess  suspected although evaluation limited without contrast. This  contributes to spinal stenosis most prominent C6 level.  Cervical spondylotic changes most notable C3-4 level.   Plan: Whitaker Iser is a 67 y.o. year old male with acute encephalopathy secondary to sepsis from multiple sources.    CV- Stable - Cardiology on board, greatly appreciate recommendations - Heart cath on 09/21/2012, Findings:  - Widely patent coronary arteries   - Moderate to severe pulmonary hypertension with mean pulmonary capillary wedge pressure of 14 mmHg no significant V wave. - Probable mitral valve replacement early next week  Pericarditis, Afib with RVR - Improving, Likely due to endocarditis rather than prolonged infection - Likely Pericarditis causing Afib, concern for purulence, afib currently rhythm controlled with amiodarone - TEE on 12/30 confirmed MV vegetation listed below and slight improvement of pericarditis  Acute Diastolic Heart failure - secondary to mitral regurgitation caused by large vegetation - Lasix 40 mg IV BID - Strict I/O, daily weights - negative 34mL, KVO fluids  ID - metastatic infection - ID on Board-   - Continue Ancef, likely will need 6-8 weeks, needs TEE  - Reccomendations greatly appreciated - WBC leveling, 11.0 on  12/30, 13.7 12/31, 14.8 on 11/2, 14.6 on 09/21/12, 14.6 today - Blood culture from 12/30 during fever pending, No growth on previous from 12/26  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, likely source of emboli seen on MRI previously - ID, cardiology, and CT surgery following and treating- appreciate management and reccs - Will follow along for likely need for surgical intervention  MSSA Bacteremia - Blood Cx 12/23 shows pan sensitive MSSA, Repeat from 12/26 NGTD, Repeated on 09/18/2012- NGTD - R wrist   - OR by Hand surgery on 12/26 for debridement- Cx with mod MSSA  - Hand surgery signed off - R MTP-   - Debridment and hardware removal on 12/26   - Ortho signed off - Continue to trend CBC and ESR  - WBC up to 13.7 from 11.0 1 day ago  - ESR increased 60 from 66 1day ago  - Hgb stable around 10 - Will consult wound care for dressing changes since ortho signed off.  - Picc line placed on 12/30, if blood cultures grow anything will consider replacing as it will ikely be seeded.   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 - Greatly appreciate input.  PAD/Ischemic limb:  - Vascular on board- appreciate reccomendations - concern on admission that LE were ischemic. Workup performed by vascular in ED which was positive for PAD on ABI testing. No reported claudication by pt.  - No heparin currently due to cerebral emboli - Vascular plans to perform LE angio after clearing infection  Abdominal Pain - resolved today - DG abdomen with no significant findings - Lactic acid WNL - Continue to follow, no other interventions at this time  Acute Kidney Injury.  - Dehydration vs infection induced vs CKD.  - CR 1.4 on admission 0.85 today  -  UOP 601 mL - BMP daily, continue to follow   Elevated Transaminases -  - Pt AST:ALT 2:1 consistent with alcoholic picture. EtOH on admission < 11  - on CIWA   Newly Dx DM II - A1C 6.5 on check  - Consider sensitive SSI if starts to develop hyperglycemia, Blood glucose fine for now.   FEN/GI:  - Carb modified diet  - KVO NS - Strict I/O  Prophylaxis: SCD Disposition: Step down for now, Home with home health pending further workup and improvement  Kenn File, MD 09/22/2012, 7:20 AM

## 2012-09-22 NOTE — Progress Notes (Signed)
Occupational Therapy Treatment Patient Details Name: Ryan [name] MRN: [id number] DOB: 07/18/1946 Today's Date: 09/22/2012 Time: [id number] OT Time Calculation (min): 45 min  OT Assessment / Plan / Recommendation Comments on Treatment Session Pt demonstrates improving functional mobility and increased ability to assist with BADLs.    Follow Up Recommendations  CIR    Barriers to Discharge       Equipment Recommendations  3 in 1 bedside comode;Tub/shower bench    Recommendations for Other Services Rehab consult  Frequency Min 2X/week   Plan Discharge plan remains appropriate    Precautions / Restrictions Precautions Precautions: Fall Restrictions Weight Bearing Restrictions: Yes RUE Weight Bearing: Non weight bearing RLE Weight Bearing: Weight bearing as tolerated   Pertinent Vitals/Pain     ADL  Lower Body Bathing: +1 Total assistance Lower Body Bathing: Patient Percentage: 30% Where Assessed - Lower Body Bathing: Supported sit to stand Toilet Transfer: +2 Total assistance Toilet Transfer: Patient Percentage: 60% Toilet Transfer Method: Stand pivot Science writer: Other (comment) Building services engineer) Toileting - Clothing Manipulation and Hygiene: +1 Total assistance Toileting - Clothing Manipulation and Hygiene: Patient Percentage: 30% Transfers/Ambulation Related to ADLs: Pt transfers to recliner with Total A +2 (pt ~60%) ADL Comments: Pt's condom catheter came off during transition of supine to sit.  Pt. assisted with clean up in standing position    OT Diagnosis:    OT Problem List:   OT Treatment Interventions:     OT Goals ADL Goals ADL Goal: Toilet Transfer - Progress: Progressing toward goals ADL Goal: Toileting - Clothing Manipulation - Progress: Progressing toward goals ADL Goal: Toileting - Hygiene - Progress: Progressing toward goals  Visit Information  Last OT Received On: 09/22/12 Assistance Needed: +2    Subjective Data      Prior  Functioning       Cognition  Overall Cognitive Status: Impaired Area of Impairment: Memory;Awareness of deficits Arousal/Alertness: Awake/alert Orientation Level: Oriented X4 / Intact Behavior During Session: WFL for tasks performed Memory Deficits: Min cues to recall events of morning    Mobility  Shoulder Instructions Bed Mobility Bed Mobility: Supine to Sit Supine to Sit: 1: +2 Total assist Supine to Sit: Patient Percentage: 60% Details for Bed Mobility Assistance: assist for lower extremities to get off bed slowly as well as trunk support posteriorly to rise from bed surface and scoot hips EOB; pt needing moderate sequencing cues for body sequencing Transfers Sit to Stand: 1: +2 Total assist;With upper extremity assist (Left UE assist) Sit to Stand: Patient Percentage: 70% Stand to Sit: 1: +2 Total assist;To bed;To chair/3-in-1 Stand to Sit: Patient Percentage: 70% Details for Transfer Assistance: sequencing and faciliatory cues for tall posture and engagement of tunk for anterior translation of trunk over BOS as well as hip/trunk and knee extension for tall posture       Exercises  General Exercises - Lower Extremity Ankle Circles/Pumps: AROM;Both;10 reps;Supine Quad Sets: AROM;Both;10 reps;Supine Shoulder Exercises Digit Composite Flexion: AAROM;AROM;10 reps;Seated (~40%`) Composite Extension: AROM;AAROM;Sidelying;10 reps (~60%) Other Exercises Other Exercises: Pt. instructed to perform active and PROM digit flexio and extension 10 reps 4-6 x/day.  He verbalized understanding   Balance Balance Balance Assessed: Yes Static Sitting Balance Static Sitting - Balance Support: Feet supported Static Sitting - Level of Assistance: 5: Stand by assistance Static Standing Balance Static Standing - Balance Support: Left upper extremity supported Static Standing - Level of Assistance: 4: Min assist Static Standing - Comment/# of Minutes: LUE supported by therapist with mod-min  faciliatory  cues for tall posture with hip/trunk extension; pt stood at least 5 minutes initially needing more support for anterior translation of weight for more midline posture; does stand with a bend right knee and has difficulty extending due to pain and arthritis   End of Session    GO     Grant Henkes M 09/22/2012, 3:21 PM

## 2012-09-22 NOTE — Progress Notes (Signed)
1 Day Post-Op Procedure(s) (LRB): CORONARY ANGIOGRAM () RIGHT HEART CATH () Subjective: No new complaints  Objective: Vital signs in last 24 hours: Temp:  [97.8 F (36.6 C)-99.3 F (37.4 C)] 97.8 F (36.6 C) (01/03 1156) Pulse Rate:  [69-86] 86  (01/03 1200) Cardiac Rhythm:  [-] Normal sinus rhythm (01/03 1200) Resp:  [1-20] 6  (01/03 1200) BP: (149-178)/(50-84) 156/84 mmHg (01/03 1200) SpO2:  [92 %-99 %] 97 % (01/03 1200)  Hemodynamic parameters for last 24 hours:    Intake/Output from previous day: 01/02 0701 - 01/03 0700 In: 437.6 [I.V.:337.6; IV Piggyback:100] Out: 601 [Urine:600; Stool:1] Intake/Output this shift: Total I/O In: 370 [P.O.:300; I.V.:20; IV Piggyback:50] Out: 400 [Urine:400]  General appearance: alert and cooperative Heart: regular rate and rhythm, systolic murmur unchanged Lungs: clear to auscultation bilaterally Extremities: no edema  Lab Results:  Basename 09/22/12 0500 09/21/12 0410  WBC 14.6* 14.6*  HGB 9.4* 9.6*  HCT 27.3* 28.0*  PLT 427* 398   BMET:  Basename 09/22/12 0500 09/21/12 0410  NA 140 136  K 4.2 3.5  CL 104 100  CO2 25 26  GLUCOSE 87 92  BUN 18 21  CREATININE 0.85 0.85  CALCIUM 8.6 8.4    PT/INR:  Basename 09/21/12 0410  LABPROT 15.1  INR 1.21   ABG    Component Value Date/Time   PHART 7.481* 09/21/2012 1246   HCO3 24.4* 09/21/2012 1246   TCO2 25 09/21/2012 1246   ACIDBASEDEF 1.1 09/13/2012 0411   O2SAT 96.0 09/21/2012 1246   CBG (last 3)  No results found for this basename: GLUCAP:3 in the last 72 hours  Assessment/Plan: S/P Procedure(s) (LRB): CORONARY ANGIOGRAM () RIGHT HEART CATH () Cath reviewed and there is no significant coronary disease, moderate pulmonary hypertension. He remains afebrile. Last BC from 12/30 negative. Plan MVR on Tuesday.   LOS: 10 days    BARTLE,BRYAN K 09/22/2012

## 2012-09-23 LAB — BASIC METABOLIC PANEL
BUN: 16 mg/dL (ref 6–23)
CO2: 26 mEq/L (ref 19–32)
Calcium: 8.7 mg/dL (ref 8.4–10.5)
Chloride: 102 mEq/L (ref 96–112)
Creatinine, Ser: 0.84 mg/dL (ref 0.50–1.35)
GFR calc Af Amer: >90 mL/min (ref >90)
GFR calc non Af Amer: 88 mL/min — ABNORMAL LOW (ref >90)
Glucose, Bld: 94 mg/dL (ref 70–99)
Potassium: 3.6 mEq/L (ref 3.5–5.1)
Sodium: 135 mEq/L (ref 135–145)

## 2012-09-23 LAB — CBC
HCT: 27.1 % — ABNORMAL LOW (ref 39.0–52.0)
Hemoglobin: 9.1 g/dL — ABNORMAL LOW (ref 13.0–17.0)
MCH: 21.9 pg — ABNORMAL LOW (ref 26.0–34.0)
MCHC: 33.6 g/dL (ref 30.0–36.0)
MCV: 65.3 fL — ABNORMAL LOW (ref 78.0–100.0)
Platelets: 467 10*3/uL — ABNORMAL HIGH (ref 150–400)
RBC: 4.15 MIL/uL — ABNORMAL LOW (ref 4.22–5.81)
RDW: 15.5 % (ref 11.5–15.5)
WBC: 14.1 10*3/uL — ABNORMAL HIGH (ref 4.0–10.5)

## 2012-09-23 LAB — SEDIMENTATION RATE: Sed Rate: 72 mm/hr — ABNORMAL HIGH (ref 0–16)

## 2012-09-23 MED ORDER — WHITE PETROLATUM GEL
Status: AC
  Administered 2012-09-23: 18:00:00
  Filled 2012-09-23: qty 5

## 2012-09-23 NOTE — Progress Notes (Signed)
Subjective:  No c/o SOB or chest pain  Objective:  Vital Signs in the last 24 hours: BP 159/71  Pulse 76  Temp 98.9 F (37.2 C) (Oral)  Resp 5  Ht 6' (1.829 m)  Wt 91.8 kg (202 lb 6.1 oz)  BMI 27.45 kg/m2  SpO2 97%  Physical Exam: Pleasant AA WM in NAD Lungs:  Clear Cardiac:  Regular rhythm, normal S1 and S2, no S3 Abdomen:  Soft, nontender, no masses Extremities:  No edema present  Intake/Output from previous day: 01/03 0701 - 01/04 0700 In: 880 [P.O.:520; I.V.:260; IV Piggyback:100] Out: 1175 [Urine:1175] Weight Filed Weights   09/13/12 0213 09/13/12 0338 09/21/12 1448  Weight: 84.596 kg (186 lb 8 oz) 84.4 kg (186 lb 1.1 oz) 91.8 kg (202 lb 6.1 oz)   Lab Results: Basic Metabolic Panel:  Basename 09/23/12 0500 09/22/12 0500  NA 135 140  K 3.6 4.2  CL 102 104  CO2 26 25  GLUCOSE 94 87  BUN 16 18  CREATININE 0.84 0.85   CBC:  Basename 09/23/12 0500 09/22/12 0500  WBC 14.1* 14.6*  NEUTROABS -- --  HGB 9.1* 9.4*  HCT 27.1* 27.3*  MCV 65.3* 65.0*  PLT 467* 427*   BNP    Component Value Date/Time   PROBNP 3458.0* 09/16/2012 2117   Telemetry: Sinus rhythm  Assessment/Plan:  1. 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 Significant mitral regurgitation with diastolic heart failure, stable  3. Acute diastolic heart failure improved with diuresis  4. Pericarditis due to problem #1  5. Multiple CNS emboli 6. Anemia  Rec:  On IV lasix.  Appears stable at present.  Surgery is planned for Tuesday.  Kerry Hough  MD [name]  09/23/2012, 9:10 AM

## 2012-09-23 NOTE — Progress Notes (Signed)
I examined this patient and discussed the care plan with Dr Wendi Snipes and the [facility] team and agree with assessment and plan as documented in the progress note above. His strength and sensation are normal in his left hand.

## 2012-09-23 NOTE — Progress Notes (Signed)
Family Medicine Teaching Service Daily Progress Note Service Page: [phone]  Patient Assessment: 67 y.o. year old male with acute encephalopathy (now resolved) secondary to sepsis from multiple sources, s/p I&D of R wrist and R MTP lesion (with HW removal), Multiple small R hemisphere infarcts that are likely septic emboli, C6-T1 Diskitis, pericarditis,  Endocarditis, and E. Coli UTI.  .  Subjective:  Feeling ok today, slept well, a little uncomfortable in bed. Denies Chest pain this morning and states that his hand and foot aren't bothering him much. No abdominal pain, no new weakness.   Objective: Temp:  [97.8 F (36.6 C)-98.9 F (37.2 C)] 98.9 F (37.2 C) (01/04 0816) Pulse Rate:  [66-90] 76  (01/04 0910) Resp:  [5-21] 5  (01/03 2200) BP: (133-161)/(48-84) 153/71 mmHg (01/04 0910) SpO2:  [93 %-99 %] 96 % (01/04 0910)  Exam: Gen: NAD, alert, cooperative with exam, lying in bed HEENT: NCAT, EOMI, MMM CV: RRR, no murmur appreciated today Resp: CTABL, no wheezes, non-labored Abd: SNTND, , BS+ Ext: No edema, warm, R foot wrapped in clean ACE bandage, SCDs in place Neuro: Alert and oriented times 3, face symmetric, speaks easily, No gross deficits  I have reviewed the patient's medications, labs, imaging, and diagnostic testing.  Notable results are summarized below.  CBC BMET   Lab 09/23/12 0500 09/22/12 0500 09/21/12 0410  WBC 14.1* 14.6* 14.6*  HGB 9.1* 9.4* 9.6*  HCT 27.1* 27.3* 28.0*  PLT 467* 427* 398    Lab 09/23/12 0500 09/22/12 0500 09/21/12 0410  NA 135 140 136  K 3.6 4.2 3.5  CL 102 104 100  CO2 26 25 26   BUN 16 18 21   CREATININE 0.84 0.85 0.85  GLUCOSE 94 87 92  CALCIUM 8.7 8.6 8.4    Blood Culture 12/26- No growth- final Blood culture 12/30- NGTD   09/19/2012 05:00 09/20/2012 04:06 09/21/2012 04:10 09/22/2012 05:00 09/23/2012 05:00  Sed Rate 66 (H) 60 (H) 57 (H) 57 (H) 72 (H)    Imaging/Diagnostic Tests: TEE 09/18/2012 - Left ventricle: The cavity size was  normal. Wall thickness was normal. Systolic function was normal. The estimated ejection fraction was in the range of 60% to 65%. - Aortic valve: No evidence of vegetation. - Mitral valve: Large 2.5cm diameter vegetation/ mass on the atrial surface of the anterior mitral leaflet with central cavity (Doppler flow within) leading to two separate mitral regurgitant jets (moderate grade) exiting from the mass itself. - Left atrium: No evidence of thrombus in the atrial cavity or appendage. - Right atrium: No evidence of thrombus in the atrial cavity or appendage. - Atrial septum: No defect or patent foramen ovale was identified. - Tricuspid valve: No evidence of vegetation. - Pulmonic valve: No evidence of vegetation. - Superior vena cava: The study excluded a thrombus. - Pericardium, extracardiac: A small, free-flowing pericardial effusion was identified along the right atrial free wall.  2D TTE 09/17/2012 - Mitral valve: There was a large, cystic (hypoechoic interior) mass on the lateral aspect of the annulus; the abnormality is new since the previous study; the appearance is consistent with vegetation and/or abscess. Moderate to severe regurgitation, with multiple jets directed eccentrically. - Pericardium, extracardiac: A small pericardial effusion was identified posterior to the heart. The fluid had no internal echoes.There was no evidence of hemodynamic compromise.  V/Q scan 12/28 IMPRESSION:  Low probability study for pulmonary embolus.  CXR 12/28 IMPRESSION:  Increasing bibasilar opacities bilaterally.  MRI Head 12/27 Several small areas of acute infarction involving  the right  hemisphere including portions of the; right occipital lobe, right  temporal lobe, right opercular region, posterior right frontal lobe  and right parietal lobe. Tiny small acute infarcts involving the  posterior left temporal lobe and left parietal - occipital  junction. Tiny acute infarcts  left cerebellum and right cerebellar  tonsil.  This distribution of infarcts raises possibility of embolic  disease. As the patient has findings of diskitis of the cervical  spine as discussed below, septic emboli not entirely excluded.  Opacification right mastoid air cells and right middle ear as noted  Above.  MRA head 12/27 Branch vessel atherosclerotic type changes as noted above.  MRI Cervical Spine 12/27 C6-7 diskitis with spread of infection to the adjacent facet  joints, spinous process and interspinous ligaments/posterior  paraspinal musculature as discussed above. Epidural abscess  suspected although evaluation limited without contrast. This  contributes to spinal stenosis most prominent C6 level.  Cervical spondylotic changes most notable C3-4 level.   Plan: Ryan [name] is a 67 y.o. year old male with acute encephalopathy secondary to sepsis from multiple sources.    CV- Stable - Cardiology on board, greatly appreciate recommendations - Heart cath on 09/21/2012, Findings:  - Widely patent coronary arteries   - Moderate to severe pulmonary hypertension with mean pulmonary capillary wedge pressure of 14 mmHg no significant V wave. - CTS planning mitral valve replacement on Tuesday  - PFTs performed 1/3 per Dr, Cyndia Bent- pending results  Pericarditis, Afib with RVR - Improving, Likely due to endocarditis - causing Afib, currently rhythm controlled with amiodarone - TEE on 12/30 confirmed MV vegetation listed below and slight improvement of pericarditis  Acute Diastolic Heart failure - secondary to mitral regurgitation caused by large vegetation - Lasix 40 mg IV BID - Strict I/O, daily weights - negative 76mL, KVO fluids  ID - metastatic infection - ID on Board-   - Continue Ancef, likely will need 6-8 weeks, needs TEE  - Reccomendations greatly appreciated - WBC 14.1- stable from previous - Blood culture from 12/30 during fever with NGTD, No growth on previous  from 12/26  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  - ID, cardiology, and CT surgery following and treating- appreciate management and reccs - MVR on tuesday  MSSA Bacteremia - Blood Cx 12/23 shows pan sensitive MSSA, Repeat from 12/26 NGTD, Repeated on 09/18/2012- NGTD - R wrist   - OR by Hand surgery on 12/26 for debridement- Cx with mod MSSA  - Hand surgery signed off - R MTP-   - Debridment and hardware removal on 12/26   - Ortho signed off - Continue to trend CBC and ESR  - WBC up to 13.7 from 11.0 1 day ago  - ESR increased 60 from 66 1day ago  - Hgb stable around 10 - Will consult wound care for dressing changes since ortho signed off.  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 - Greatly appreciate input.   PAD/Ischemic limb:  - Vascular on board- appreciate reccomendations - concern on admission that LE were ischemic.  - R ABI 0.48 on 1/2/214, L ABI WNL -  No heparin currently due to cerebral emboli - Vascular plans to perform LE angio after clearing infection with Brabham MD  Acute Kidney Injury.  - Dehydration vs infection induced vs CKD.  - CR 1.4 on admission 0.84 today  - UOP 113mL - BMP daily, continue to follow   Elevated Transaminases -  - Pt AST:ALT 2:1 consistent with alcoholic picture. EtOH on admission < 11  - on CIWA   Newly Dx DM II - A1C 6.5 on check  - Consider sensitive SSI if starts to develop hyperglycemia, Blood glucose fine for now.   FEN/GI:  - Carb modified diet  - KVO NS - Strict I/O  Prophylaxis: SCD Disposition: Step down for now,  CIR vs SNF with PT after workup.  Kenn File, MD 09/23/2012, 10:19 AM

## 2012-09-23 NOTE — Progress Notes (Signed)
Po intake is poor.  Pt. Encouraged to take ensure shakes as supplements, but requires much encouragement to do so.  Having loose stools as well.  States "I just don't have any appetite".

## 2012-09-24 LAB — CBC
HCT: 26.5 % — ABNORMAL LOW (ref 39.0–52.0)
Hemoglobin: 8.8 g/dL — ABNORMAL LOW (ref 13.0–17.0)
MCH: 21.7 pg — ABNORMAL LOW (ref 26.0–34.0)
MCHC: 33.2 g/dL (ref 30.0–36.0)
MCV: 65.4 fL — ABNORMAL LOW (ref 78.0–100.0)
Platelets: 439 10*3/uL — ABNORMAL HIGH (ref 150–400)
RBC: 4.05 MIL/uL — ABNORMAL LOW (ref 4.22–5.81)
RDW: 15.5 % (ref 11.5–15.5)
WBC: 13.2 10*3/uL — ABNORMAL HIGH (ref 4.0–10.5)

## 2012-09-24 LAB — BASIC METABOLIC PANEL
BUN: 13 mg/dL (ref 6–23)
CO2: 23 mEq/L (ref 19–32)
Calcium: 8.2 mg/dL — ABNORMAL LOW (ref 8.4–10.5)
Chloride: 102 mEq/L (ref 96–112)
Creatinine, Ser: 0.77 mg/dL (ref 0.50–1.35)
GFR calc Af Amer: >90 mL/min (ref >90)
GFR calc non Af Amer: >90 mL/min (ref >90)
Glucose, Bld: 108 mg/dL — ABNORMAL HIGH (ref 70–99)
Potassium: 3.4 mEq/L — ABNORMAL LOW (ref 3.5–5.1)
Sodium: 136 mEq/L (ref 135–145)

## 2012-09-24 LAB — SEDIMENTATION RATE: Sed Rate: 56 mm/hr — ABNORMAL HIGH (ref 0–16)

## 2012-09-24 LAB — CLOSTRIDIUM DIFFICILE BY PCR: Toxigenic C. Difficile by PCR: NEGATIVE

## 2012-09-24 MED ORDER — PANTOPRAZOLE SODIUM 40 MG PO TBEC: 40.0000 mg | DELAYED_RELEASE_TABLET | Freq: Every day | ORAL | Status: DC

## 2012-09-24 MED ORDER — PANTOPRAZOLE SODIUM 40 MG PO TBEC
40.0000 mg | DELAYED_RELEASE_TABLET | Freq: Every day | ORAL | Status: DC
  Administered 2012-09-24 – 2012-09-25 (×2): 40 mg via ORAL
  Filled 2012-09-24 (×2): qty 1

## 2012-09-24 MED ORDER — ALUM & MAG HYDROXIDE-SIMETH 200-200-20 MG/5ML PO SUSP
15.0000 mL | Freq: Four times a day (QID) | ORAL | Status: DC | PRN
  Administered 2012-09-24: 15 mL via ORAL
  Filled 2012-09-24: qty 30

## 2012-09-24 NOTE — Progress Notes (Signed)
Family Medicine Teaching Service Daily Progress Note Service Page: [phone]  Patient Assessment: 67 y.o. year old male with acute encephalopathy (now resolved) secondary to sepsis from multiple sources, s/p I&D of R wrist and R MTP lesion (with HW removal), Multiple small R hemisphere infarcts that are likely septic emboli, C6-T1 Diskitis, pericarditis,  Endocarditis, and E. Coli UTI (s/p treatment).   Subjective:  Reports some indigestion and mild dyspnea.  Pain controlled  Objective: Temp:  [97.8 F (36.6 C)-99.3 F (37.4 C)] 98.8 F (37.1 C) (01/05 0745) Pulse Rate:  [67-109] 109  (01/05 0800) Resp:  [15-16] 15  (01/05 0745) BP: (133-158)/(39-76) 133/59 mmHg (01/05 0745) SpO2:  [92 %-97 %] 92 % (01/05 0800)  Exam: Gen: NAD, alert, cooperative with exam, lying in bed HEENT: NCAT, EOMI, MMM CV: Tachy but regular, I do not appreciate a murmur Resp: CTABL, no wheezes, non-labored Abd: SNTND, BS+ Ext: No edema, warm, R foot wrapped in clean ACE bandage, SCDs in place, <2 sec cap refill Neuro: Alert and oriented times 3, face symmetric, speaks easily, No gross deficits  I have reviewed the patient's medications, labs, imaging, and diagnostic testing.  Notable results are summarized below.  CBC BMET   Lab 09/24/12 0610 09/23/12 0500 09/22/12 0500  WBC 13.2* 14.1* 14.6*  HGB 8.8* 9.1* 9.4*  HCT 26.5* 27.1* 27.3*  PLT 439* 467* 427*    Lab 09/24/12 0610 09/23/12 0500 09/22/12 0500  NA 136 135 140  K 3.4* 3.6 4.2  CL 102 102 104  CO2 23 26 25   BUN 13 16 18   CREATININE 0.77 0.84 0.85  GLUCOSE 108* 94 87  CALCIUM 8.2* 8.7 8.6    Blood Culture 12/26- No growth- final Blood culture 12/30- NGTD C. Diff 1/4 - Pending  Imaging/Diagnostic Tests: TEE 09/18/2012 - The estimated ejection fraction was in the range of 60% to 65%. - Aortic valve: No evidence of vegetation. - Mitral valve: Large 2.5cm diameter vegetation/ - Left atrium: No evidence of thrombus in the atrial cavity  or appendage. - Right atrium: No evidence of thrombus in the atrial cavity or appendage. - Tricuspid valve: No evidence of vegetation. - Pulmonic valve: No evidence of vegetation. - Pericardium, extracardiac: A small, free-flowing pericardial effusion was identified along the right atrial free wall.  MRI Head 12/27 Several small areas of acute infarction involving the right  hemisphere including portions of the; right occipital lobe, right  temporal lobe, right opercular region, posterior right frontal lobe  and right parietal lobe.  MRI Cervical Spine 12/27 C6-7 diskitis with spread of infection to the adjacent facet  joints, spinous process and interspinous ligaments/posterior  paraspinal musculature as discussed above. Epidural abscess  suspected although evaluation limited without contrast.  Plan: Ryan [name] is a 67 y.o. year old male with acute encephalopathy secondary to sepsis from multiple sources.    CV- Stable - Cardiology on board, greatly appreciate recommendations - Continue Lasix 40mg  PO for now, if SOB increases may increase diuresis.  Currently appears euvolemic - Heart cath on 09/21/2012, Findings:  - Widely patent coronary arteries, pulmonary HTN with PCWP of 27mmHg  - CTS planning mitral valve replacement on Tuesday  - PFTs performed 1/3 per Dr, Cyndia Bent- pending results  Pericarditis, Afib with RVR - Improving, Likely due to endocarditis - causing Afib, currently rhythm controlled with amiodarone - TEE on 12/30 confirmed MV vegetation listed below and slight improvement of pericarditis  Acute Diastolic Heart failure - secondary to mitral regurgitation caused by large  vegetation - Lasix 40 mg PO Daily (IV stopped 1/3) - Strict I/O, daily weights - Net positive yesterday, if SOB worsens, will increase to 40 BID.  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  - ID, cardiology, and CT surgery following and treating- appreciate management and reccs - MVR on tuesday  MSSA Bacteremia - Blood Cx 12/23 shows pan sensitive MSSA, Repeat from 12/26 NGTD, Repeated on 09/18/2012- NGTD - R wrist   - OR by Hand surgery on 12/26 for debridement- Cx with mod MSSA  - Hand surgery signed off - R MTP-   - Debridment and hardware removal on 12/26   - Ortho signed off - Continue to trend CBC and ESR  - WBC up to 13.7 from 11.0 1 day ago  - ESR increased 60 from 66 1day ago  - Hgb stable around 10 - Wound care consulted for dressing changes since ortho signed off.  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  - R ABI 0.48 on 1/2/214, L ABI WNL - No heparin currently due to cerebral emboli - Vascular plans to perform LE angio after clearing infection with Br[place]ham MD  Acute Kidney Injury.  - Resolved, follow daily BMET  Elevated Transaminases -  - Pt AST:ALT 2:1 consistent with alcoholic picture. EtOH on admission < 11  - Recheck LFTs tomorrow   Newly Dx DM II - A1C 6.5 on check  - Consider sensitive SSI if starts to develop hyperglycemia, Blood glucose fine for now.   FEN/GI:  - Carb modified diet  - KVO NS - Strict I/O  Prophylaxis: SCD Disposition: Step down for now, CIR vs SNF with PT after workup.  [place], MD 09/24/2012, 9:32 AM

## 2012-09-24 NOTE — Progress Notes (Signed)
I examined this patient and discussed the care plan with Dr Jess Barters and the [facility] team and agree with assessment and plan as documented in the progress note above. He has some neck discomfort after having sat up without neck support. I doubled a pillow behind his neck and recommended a neck support pillow for home use. He was using a soft collar at home. No increase in arm symptoms.

## 2012-09-24 NOTE — Progress Notes (Signed)
Subjective:  C/o mild indigestion today. MIld dyspneic today.    Objective:  Vital Signs in the last 24 hours: BP 133/59  Pulse 107  Temp 98.8 F (37.1 C) (Oral)  Resp 15  Ht 6' (1.829 m)  Wt 91.8 kg (202 lb 6.1 oz)  BMI 27.45 kg/m2  SpO2 92%  Physical Exam: Pleasant AA WM in NAD Lungs:  Clear Cardiac:  Regular rhythm, normal S1 and S2,? S4 1-2/6 murmur Abdomen:  Soft, nontender, no masses Extremities:  No edema present  Intake/Output from previous day: 01/04 0701 - 01/05 0700 In: 1073.2 [P.O.:720; I.V.:203.2; IV Piggyback:150] Out: 600 [Urine:600] Weight Filed Weights   09/13/12 0213 09/13/12 0338 09/21/12 1448  Weight: 84.596 kg (186 lb 8 oz) 84.4 kg (186 lb 1.1 oz) 91.8 kg (202 lb 6.1 oz)   Lab Results: Basic Metabolic Panel:  Basename 09/24/12 0610 09/23/12 0500  NA 136 135  K 3.4* 3.6  CL 102 102  CO2 23 26  GLUCOSE 108* 94  BUN 13 16  CREATININE 0.77 0.84   CBC:  Basename 09/24/12 0610 09/23/12 0500  WBC 13.2* 14.1*  NEUTROABS -- --  HGB 8.8* 9.1*  HCT 26.5* 27.1*  MCV 65.4* 65.3*  PLT 439* 467*   BNP    Component Value Date/Time   PROBNP 3458.0* 09/16/2012 2117   Telemetry: Sinus rhythm  Assessment/Plan:  1. 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 Significant mitral regurgitation with diastolic heart failure, stable  3. Acute diastolic heart failure improved with diuresis  4. Pericarditis due to problem #1  5. Multiple CNS emboli 6. Anemia 7. Indigestion?cause 8. ? C. diff  Rec:  On IV lasix. Having indigestion today. Some diarrhea. C diff treatment per primary team. Surgery is planned for Tuesday.   Kerry Hough  MD [facility]  09/24/2012, 9:24 AM

## 2012-09-24 NOTE — Progress Notes (Signed)
Notified MD. Pt c/o heart burn.  New orders received.  Will continue to monitor. Ryan [name]

## 2012-09-25 DIAGNOSIS — E876 Hypokalemia: Secondary | ICD-10-CM

## 2012-09-25 LAB — HEPATIC FUNCTION PANEL
ALT: 18 U/L (ref 0–53)
AST: 49 U/L — ABNORMAL HIGH (ref 0–37)
Albumin: 1.6 g/dL — ABNORMAL LOW (ref 3.5–5.2)
Alkaline Phosphatase: 161 U/L — ABNORMAL HIGH (ref 39–117)
Bilirubin, Direct: 0.3 mg/dL (ref 0.0–0.3)
Indirect Bilirubin: 0.4 mg/dL (ref 0.3–0.9)
Total Bilirubin: 0.7 mg/dL (ref 0.3–1.2)
Total Protein: 6.1 g/dL (ref 6.0–8.3)

## 2012-09-25 LAB — CULTURE, BLOOD (ROUTINE X 2)
Culture: NO GROWTH
Culture: NO GROWTH

## 2012-09-25 LAB — BASIC METABOLIC PANEL
BUN: 11 mg/dL (ref 6–23)
CO2: 27 mEq/L (ref 19–32)
Calcium: 8.3 mg/dL — ABNORMAL LOW (ref 8.4–10.5)
Chloride: 101 mEq/L (ref 96–112)
Creatinine, Ser: 0.78 mg/dL (ref 0.50–1.35)
GFR calc Af Amer: >90 mL/min (ref >90)
GFR calc non Af Amer: >90 mL/min (ref >90)
Glucose, Bld: 103 mg/dL — ABNORMAL HIGH (ref 70–99)
Potassium: 3.4 mEq/L — ABNORMAL LOW (ref 3.5–5.1)
Sodium: 137 mEq/L (ref 135–145)

## 2012-09-25 LAB — CBC
HCT: 25.6 % — ABNORMAL LOW (ref 39.0–52.0)
Hemoglobin: 8.5 g/dL — ABNORMAL LOW (ref 13.0–17.0)
MCH: 21.8 pg — ABNORMAL LOW (ref 26.0–34.0)
MCHC: 33.2 g/dL (ref 30.0–36.0)
MCV: 65.6 fL — ABNORMAL LOW (ref 78.0–100.0)
Platelets: 371 10*3/uL (ref 150–400)
RBC: 3.9 MIL/uL — ABNORMAL LOW (ref 4.22–5.81)
RDW: 15.7 % — ABNORMAL HIGH (ref 11.5–15.5)
WBC: 14.3 10*3/uL — ABNORMAL HIGH (ref 4.0–10.5)

## 2012-09-25 LAB — ABO/RH: ABO/RH(D): A POS

## 2012-09-25 LAB — PREPARE RBC (CROSSMATCH)

## 2012-09-25 LAB — SEDIMENTATION RATE: Sed Rate: 69 mm/hr — ABNORMAL HIGH (ref 0–16)

## 2012-09-25 MED ORDER — CHLORHEXIDINE GLUCONATE 4 % EX LIQD
60.0000 mL | Freq: Once | CUTANEOUS | Status: AC
  Administered 2012-09-26: 4 via TOPICAL
  Filled 2012-09-25: qty 15

## 2012-09-25 MED ORDER — DOPAMINE-DEXTROSE 3.2-5 MG/ML-% IV SOLN
2.0000 ug/kg/min | INTRAVENOUS | Status: AC
Start: 2012-09-26 — End: 2012-09-26
  Administered 2012-09-26: 3 ug/kg/min via INTRAVENOUS
  Filled 2012-09-25: qty 250

## 2012-09-25 MED ORDER — PHENYLEPHRINE HCL 10 MG/ML IJ SOLN
30.0000 ug/min | INTRAVENOUS | Status: AC
  Administered 2012-09-26: 5 ug/min via INTRAVENOUS
  Filled 2012-09-25: qty 2

## 2012-09-25 MED ORDER — POTASSIUM CHLORIDE 2 MEQ/ML IV SOLN
80.0000 meq | INTRAVENOUS | Status: DC
  Filled 2012-09-25: qty 40

## 2012-09-25 MED ORDER — POTASSIUM CHLORIDE CRYS ER 20 MEQ PO TBCR
40.0000 meq | EXTENDED_RELEASE_TABLET | Freq: Once | ORAL | Status: AC
  Administered 2012-09-25: 40 meq via ORAL
  Filled 2012-09-25: qty 2

## 2012-09-25 MED ORDER — SODIUM CHLORIDE 0.9 % IV SOLN
INTRAVENOUS | Status: AC
  Administered 2012-09-26: 1 [IU]/h via INTRAVENOUS
  Filled 2012-09-25: qty 1

## 2012-09-25 MED ORDER — DEXTROSE 5 % IV SOLN
1.5000 g | INTRAVENOUS | Status: AC
  Administered 2012-09-26: 1.5 g via INTRAVENOUS
  Administered 2012-09-26: .75 g via INTRAVENOUS
  Filled 2012-09-25: qty 1.5

## 2012-09-25 MED ORDER — SODIUM CHLORIDE 0.9 % IV SOLN
INTRAVENOUS | Status: AC
  Administered 2012-09-26: 70 mL/h via INTRAVENOUS
  Filled 2012-09-25: qty 40

## 2012-09-25 MED ORDER — BISACODYL 5 MG PO TBEC
5.0000 mg | DELAYED_RELEASE_TABLET | Freq: Once | ORAL | Status: AC
  Administered 2012-09-25: 5 mg via ORAL
  Filled 2012-09-25: qty 1

## 2012-09-25 MED ORDER — METOPROLOL TARTRATE 12.5 MG HALF TABLET
12.5000 mg | ORAL_TABLET | Freq: Once | ORAL | Status: DC
  Filled 2012-09-25: qty 1

## 2012-09-25 MED ORDER — DEXTROSE 5 % IV SOLN
750.0000 mg | Freq: Once | INTRAVENOUS | Status: DC
  Filled 2012-09-25: qty 750

## 2012-09-25 MED ORDER — DIAZEPAM 5 MG PO TABS
5.0000 mg | ORAL_TABLET | Freq: Once | ORAL | Status: AC
  Administered 2012-09-26: 5 mg via ORAL
  Filled 2012-09-25: qty 1

## 2012-09-25 MED ORDER — VANCOMYCIN HCL 10 G IV SOLR
1500.0000 mg | INTRAVENOUS | Status: AC
  Administered 2012-09-26: 1500 mg via INTRAVENOUS
  Filled 2012-09-25: qty 1500

## 2012-09-25 MED ORDER — EPINEPHRINE HCL 1 MG/ML IJ SOLN
0.5000 ug/min | INTRAVENOUS | Status: DC
  Filled 2012-09-25: qty 4

## 2012-09-25 MED ORDER — NITROGLYCERIN IN D5W 200-5 MCG/ML-% IV SOLN
2.0000 ug/min | INTRAVENOUS | Status: AC
  Administered 2012-09-26: 5 ug/kg/min via INTRAVENOUS
  Filled 2012-09-25: qty 250

## 2012-09-25 MED ORDER — DEXTROSE 5 % IV SOLN
750.0000 mg | INTRAVENOUS | Status: DC
  Filled 2012-09-25: qty 750

## 2012-09-25 MED ORDER — DEXMEDETOMIDINE HCL IN NACL 400 MCG/100ML IV SOLN
0.1000 ug/kg/h | INTRAVENOUS | Status: AC
  Administered 2012-09-26: 0.2 ug/kg/h via INTRAVENOUS
  Filled 2012-09-25: qty 100

## 2012-09-25 MED ORDER — PLASMA-LYTE 148 IV SOLN
INTRAVENOUS | Status: DC
  Filled 2012-09-25: qty 2.5

## 2012-09-25 MED ORDER — MAGNESIUM SULFATE 50 % IJ SOLN
40.0000 meq | INTRAMUSCULAR | Status: DC
  Filled 2012-09-25: qty 10

## 2012-09-25 MED ORDER — TEMAZEPAM 15 MG PO CAPS: 15.0000 mg | ORAL_CAPSULE | Freq: Once | ORAL | Status: AC | PRN

## 2012-09-25 NOTE — Progress Notes (Signed)
4 Days Post-Op Procedure(s) (LRB): CORONARY ANGIOGRAM () RIGHT HEART CATH () Subjective: No complaints  Objective: Vital signs in last 24 hours: Temp:  [97.9 F (36.6 C)-100 F (37.8 C)] 97.9 F (36.6 C) (01/06 1205) Pulse Rate:  [75-111] 80  (01/05 2348) Cardiac Rhythm:  [-] Normal sinus rhythm (01/06 0800) Resp:  [15-20] 18  (01/06 1205) BP: (128-150)/(42-66) 150/52 mmHg (01/06 0803) SpO2:  [91 %-100 %] 94 % (01/06 1205)  Hemodynamic parameters for last 24 hours:    Intake/Output from previous day: 01/05 0701 - 01/06 0700 In: [id number] [P.O.:1300; I.V.:20; IV Piggyback:100] Out: 850 [Urine:850] Intake/Output this shift:    General appearance: alert and cooperative Neurologic: right leg weakness unchanged Heart: regular rate and rhythm, 3/6 systolic MR murmur, rub present Lungs: clear to auscultation bilaterally Extremities: no edema, right wrist and foot bandaged.  Lab Results:  Basename 09/25/12 0340 09/24/12 0610  WBC 14.3* 13.2*  HGB 8.5* 8.8*  HCT 25.6* 26.5*  PLT 371 439*   BMET:  Basename 09/25/12 0340 09/24/12 0610  NA 137 136  K 3.4* 3.4*  CL 101 102  CO2 27 23  GLUCOSE 103* 108*  BUN 11 13  CREATININE 0.78 0.77  CALCIUM 8.3* 8.2*    PT/INR: No results found for this basename: LABPROT,INR in the last 72 hours ABG    Component Value Date/Time   PHART 7.481* 09/21/2012 1246   HCO3 24.4* 09/21/2012 1246   TCO2 25 09/21/2012 1246   ACIDBASEDEF 1.1 09/13/2012 0411   O2SAT 96.0 09/21/2012 1246   CBG (last 3)  No results found for this basename: GLUCAP:3 in the last 72 hours  Assessment/Plan: S/P Procedure(s) (LRB): CORONARY ANGIOGRAM () RIGHT HEART CATH () Plan Mitral valve replacement with tissue valve in the am. I discussed the operative procedure with the patient including alternatives, benefits and risks; including but not limited to bleeding, blood transfusion, infection, stroke, myocardial infarction, graft failure, heart block requiring a permanent  pacemaker, organ dysfunction, and death.  Ryan [name] understands and agrees to proceed.   LOS: 13 days    BARTLE,BRYAN K 09/25/2012

## 2012-09-25 NOTE — Progress Notes (Signed)
INFECTIOUS DISEASE PROGRESS NOTE  ID: Ryan [name] is a 67 y.o. 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  Subjective: Without complaints  Abtx:  Anti-infectives     Start     Dose/Rate Route Frequency Ordered Stop   09/16/12 0800   fluconazole (DIFLUCAN) tablet 150 mg        150 mg Oral  Once 09/16/12 0619 09/16/12 1204   09/15/12 1400   ceFAZolin (ANCEF) IVPB 2 g/50 mL premix        2 g 100 mL/hr over 30 Minutes Intravenous 3 times per day 09/15/12 1110     09/13/12 1200   vancomycin (VANCOCIN) 750 mg in sodium chloride 0.9 % 150 mL IVPB  Status:  Discontinued        750 mg 150 mL/hr over 60 Minutes Intravenous Every 12 hours 09/13/12 0409 09/15/12 1110   09/13/12 0800   piperacillin-tazobactam (ZOSYN) IVPB 3.375 g  Status:  Discontinued        3.375 g 12.5 mL/hr over 240 Minutes Intravenous Every 8 hours 09/13/12 0409 09/15/12 1110   09/12/12 2300   vancomycin (VANCOCIN) IVPB 1000 mg/200 mL premix        1,000 mg 200 mL/hr over 60 Minutes Intravenous  Once 09/12/12 2253 09/13/12 0128   09/12/12 2300  piperacillin-tazobactam (ZOSYN) IVPB 3.375 g       3.375 g 12.5 mL/hr over 240 Minutes Intravenous  Once 09/12/12 2253 09/13/12 0023          Medications:  Scheduled:   . amiodarone  200 mg Oral BID  . aspirin  81 mg Oral Daily  . atorvastatin  40 mg Oral q1800  .  ceFAZolin (ANCEF) IV  2 g Intravenous Q8H  . colchicine  0.6 mg Oral BID  . folic acid  1 mg Oral Daily  . furosemide  40 mg Oral Daily  . metoprolol tartrate  25 mg Oral Q6H  .  multivitamin with minerals  1 tablet Oral Daily  . pantoprazole  40 mg Oral Daily  . sodium chloride  10-40 mL Intracatheter Q12H  . thiamine  100 mg Oral Daily    Objective: Vital signs in last 24 hours: Temp:  [97.8 F (36.6 C)-100 F (37.8 C)] 98 F (36.7 C) (01/06 0802) Pulse Rate:  [75-111] 80  (01/05 2348) Resp:  [15-20] 18  (01/06 0802) BP: (128-149)/(42-66) 137/42 mmHg (01/06 0606) SpO2:  [91 %-100 %] 91 % (01/06 0802)   General appearance: alert, cooperative and no distress Resp: clear to auscultation bilaterally Cardio: regular rate and rhythm and S3 present GI: normal findings: bowel sounds normal and soft, non-tender Extremities: R foot wound has dark halo around would which is closed.   Lab Results  [facility] 09/25/12 0340 09/24/12 0610  WBC 14.3* 13.2*  HGB 8.5* 8.8*  HCT 25.6* 26.5*  NA 137 136  K 3.4* 3.4*  CL 101 102  CO2 27 23  BUN 11 13  CREATININE 0.78 0.77  GLU -- --   Liver Panel  Basename 09/25/12 0340  PROT 6.1  ALBUMIN 1.6*  AST 49*  ALT 18  ALKPHOS  161*  BILITOT 0.7  BILIDIR 0.3  IBILI 0.4   Sedimentation Rate  Basename 09/25/12 0340  ESRSEDRATE 69*   C-Reactive Protein No results found for this basename: CRP:2 in the last 72 hours  Microbiology: Recent Results (from the past 240 hour(s))  CULTURE, BLOOD (ROUTINE X 2)     Status: Normal   Collection Time   09/18/12  8:49 PM      Component Value Range Status Comment   Specimen Description BLOOD LEFT FOOT   Final    Special Requests BOTTLES DRAWN AEROBIC ONLY 1CC   Final    Culture  Setup Time 09/19/2012 05:09   Final    Culture NO GROWTH 5 DAYS   Final    Report Status 09/25/2012 FINAL   Final   CULTURE, BLOOD (ROUTINE X 2)     Status: Normal   Collection Time   09/18/12  9:15 PM      Component Value Range Status Comment   Specimen Description BLOOD LEFT FOOT   Final    Special Requests BOTTLES DRAWN AEROBIC ONLY [facility]   Final    Culture  Setup Time 09/19/2012 05:09    Final    Culture NO GROWTH 5 DAYS   Final    Report Status 09/25/2012 FINAL   Final   MRSA PCR SCREENING     Status: Normal   Collection Time   09/21/12  2:45 PM      Component Value Range Status Comment   MRSA by PCR NEGATIVE  NEGATIVE Final   CLOSTRIDIUM DIFFICILE BY PCR     Status: Normal   Collection Time   09/23/12  7:34 PM      Component Value Range Status Comment   C difficile by pcr NEGATIVE  NEGATIVE Final     Studies/Results: No results found.   Assessment/Plan: MSSA sepsis  MV Endocarditis/Endocardial Abscess  Epidural abscess/discitis [id number]  Embolic disease on MRI head  Pericarditis (cath - for obstructive lesions)  Septic Arthritis R wrist, s/p I & D 12-27  Septic R foot, s/p hardware removal  PVD   No change to ancef, continue high dose  Possible MVR 1-7    Repeat BCx (-) S3 is remarkable, due to pericarditis/effusion? His R foot is probably a function of his poor circulation.   Total days of antibiotics 14 (ancef)  Bobby Rumpf Infectious Diseases [id number] 09/25/2012, 10:18 AM   LOS: 13 days

## 2012-09-25 NOTE — Progress Notes (Signed)
PT HYDROTHERAPY PROGRESS NOTE    09/25/12 0800  Subjective Assessment  Subjective Pt agreeable to hydrotherapy today  Patient and Family Stated Goals heal wound  Date of Onset 09/15/12  Prior Treatments I&D  Evaluation and Treatment  Evaluation and Treatment Procedures Explained to Patient/Family Yes  Evaluation and Treatment Procedures agreed to  Wound 09/16/12 Other (Comment) Wrist Right;Other (Comment) Right wrist  Date First Assessed/Time First Assessed: 09/16/12 1005   Wound Type: Other (Comment)  Location: Wrist  Location Orientation: Right;Other (Comment)  Wound Description (Comments): Right wrist  Present on Admission: No  Site / Wound Assessment Red;Clean;Other (Comment) (Tendon and exposed fascia)  % Wound base Red or Granulating 50%  % Wound base Other (Comment) 50% (Fascia and tendons.)  Peri-wound Assessment Intact  Margins Unattacted edges (unapproximated)  Closure Sutures ((2 black sutures (one proximal end of wound, one at distal)))  Drainage Amount Minimal  Drainage Description Serosanguineous;No odor  Non-staged Wound Description Full thickness  Treatment Hydrotherapy (Pulse lavage)  Dressing Type Gauze (Comment);Compression wrap;Moist to moist;Other (Comment);Impregnated gauze (bismuth) (1/4" plain packing strip NS Moist to Moist, xeroform)  Dressing Changed Changed  Dressing Status Clean;Dry;Intact (includes splint, kerlix, ace wrap, mission sling)  Hydrotherapy  Pulsed Lavage with Suction (psi) 4 psi  Pulsed Lavage with Suction - Normal Saline Used 1000 mL  Pulsed Lavage Tip Tip with splash shield  Pulsed lavage therapy - wound location Rt dorsal wrist  Wound Therapy - Assess/Plan/Recommendations  Wound Therapy - Clinical Statement Wound healing well; Recommend D/C of hydrotherapy at this time. Pt s/p right wrist I & D 09/15/12. Wound bed continues to be free of necrotic tissue and drainage was NOT purulent.   Wound Therapy - Functional Problem List  Increased pain causing decreased use of right UE.  Factors Delaying/Impairing Wound Healing Infection - systemic/local;Immobility  Hydrotherapy Plan Debridement;Dressing change;Patient/family education;Pulsatile lavage with suction  Wound Therapy - Frequency 3X / week  Wound Therapy - Follow Up Recommendations Home health RN  Wound Plan rec d/c hydrotherapy     Ryan [name], [place] DPT  [phone]

## 2012-09-25 NOTE — Progress Notes (Signed)
INITIAL NUTRITION ASSESSMENT  DOCUMENTATION CODES Per approved criteria  -Not Applicable   INTERVENTION: 1. High protein snack once daily. 2. RD will continue to follow    NUTRITION DIAGNOSIS: Increased nutrient needs related to wound healing as evidenced by estimated nutrition needs.   Goal: PO intake to meet >/=90% estimated nutrition needs to support wound healing  Monitor:  PO intake, weight trends, I/O's  Reason for Assessment: Consult  67 y.o. male  Admitting Dx: Bacteremia due to Staphylococcus  ASSESSMENT: Pt s/p I%D of wrist and removal of hardware from toe. Pt also found to have vegetation on MV on TEE.  This admission po intake has been poor. Agreeable to a snack once daily for increased protein to assist in wound healing.  States po intake was normal PTA.   Height: Ht Readings from Last 1 Encounters:  09/21/12 6' (1.829 m)    Weight: Wt Readings from Last 1 Encounters:  09/21/12 202 lb 6.1 oz (91.8 kg)  Admission weight: 186 lbs   Ideal Body Weight: 178 lbs   % Ideal Body Weight: 113%  Wt Readings from Last 10 Encounters:  09/21/12 202 lb 6.1 oz (91.8 kg)  09/21/12 202 lb 6.1 oz (91.8 kg)  09/21/12 202 lb 6.1 oz (91.8 kg)  09/21/12 202 lb 6.1 oz (91.8 kg)    Usual Body Weight: 186 lbs   % Usual Body Weight: 109%  BMI:  Body mass index is 27.45 kg/(m^2). Overweight   Estimated Nutritional Needs: Kcal: [id number] Protein: 130-140 gm  Fluid: 2.4-2.6 L/day  Skin: non-healing wound to R foot on admission, now with incisions to R arm and R foot.   Diet Order: Cardiac PO intake: 5-50% when documented   EDUCATION NEEDS: -No education needs identified at this time   Intake/Output Summary (Last 24 hours) at 09/25/12 1222 Last data filed at 09/25/12 0602  Gross per 24 hour  Intake    920 ml  Output    850 ml  Net     70 ml    Last BM: 1/6   Labs:   Lab 09/25/12 0340 09/24/12 0610 09/23/12 0500  NA 137 136 135  K 3.4* 3.4* 3.6  CL  101 102 102  CO2 27 23 26   BUN 11 13 16   CREATININE 0.78 0.77 0.84  CALCIUM 8.3* 8.2* 8.7  MG -- -- --  PHOS -- -- --  GLUCOSE 103* 108* 94    CBG (last 3)  No results found for this basename: GLUCAP:3 in the last 72 hours  Scheduled Meds:   . amiodarone  200 mg Oral BID  . aspirin  81 mg Oral Daily  . atorvastatin  40 mg Oral q1800  .  ceFAZolin (ANCEF) IV  2 g Intravenous Q8H  . colchicine  0.6 mg Oral BID  . folic acid  1 mg Oral Daily  . furosemide  40 mg Oral Daily  . metoprolol tartrate  25 mg Oral Q6H  . multivitamin with minerals  1 tablet Oral Daily  . pantoprazole  40 mg Oral Daily  . potassium chloride  40 mEq Oral Once  . sodium chloride  10-40 mL Intracatheter Q12H  . thiamine  100 mg Oral Daily    Continuous Infusions:   . sodium chloride 20 mL/hr at 09/23/12 2200    Past Medical History  Diagnosis Date  . Hypertension     Past Surgical History  Procedure Date  . I&d extremity 09/14/2012    Procedure: IRRIGATION AND  DEBRIDEMENT EXTREMITY;  Surgeon: Tennis Must, MD;  Location: [place];  Service: Orthopedics;  Laterality: Right;  . Extremity wire/pin removal 09/14/2012    Procedure: REMOVAL K-WIRE/PIN EXTREMITY;  Surgeon: [name], MD;  Location: [place];  Service: Orthopedics;  Laterality: Right;  Right Foot  . Tee without cardioversion 09/18/2012    Procedure: TRANSESOPHAGEAL ECHOCARDIOGRAM (TEE);  Surgeon: [name], MD;  Location: [facility] ENDOSCOPY;  Service: Cardiovascular;  Laterality: N/A;    Orson Slick RD, LDN Pager [phone] After Hours pager [phone]

## 2012-09-25 NOTE — Progress Notes (Signed)
Subjective: S/p hardware removal now 10 days ago. Pt w/o complaints   Objective: Vital signs in last 24 hours: Temp:  [97.9 F (36.6 C)-100 F (37.8 C)] 99.1 F (37.3 C) (01/06 1555) Pulse Rate:  [75-104] 104  (01/06 1517) Resp:  [15-20] 18  (01/06 1555) BP: (133-150)/(42-58) 150/52 mmHg (01/06 0803) SpO2:  [91 %-100 %] 93 % (01/06 1555)  Intake/Output from previous day: 01/05 0701 - 01/06 0700 In: [id number] [P.O.:1300; I.V.:20; IV Piggyback:100] Out: 850 [Urine:850] Intake/Output this shift:     Basename 09/25/12 0340 09/24/12 0610 09/23/12 0500  HGB 8.5* 8.8* 9.1*    Basename 09/25/12 0340 09/24/12 0610  WBC 14.3* 13.2*  RBC 3.90* 4.05*  HCT 25.6* 26.5*  PLT 371 439*    Basename 09/25/12 0340 09/24/12 0610  NA 137 136  K 3.4* 3.4*  CL 101 102  CO2 27 23  BUN 11 13  CREATININE 0.78 0.77  GLUCOSE 103* 108*  CALCIUM 8.3* 8.2*   No results found for this basename: LABPT:2,INR:2 in the last 72 hours  incision: dusky and eccymotic around edges  Assessment/Plan: 11 days s/p hardware removal from foot. Pt with dusky wound.  Will follow but i am concerned that wound will break down.  Pt is undergoing vascular evaluation and it appears he may need improved blood flow if wound does not heal.  Will remove stitches in 1 week   Orris Perin L 09/25/2012, 4:27 PM

## 2012-09-25 NOTE — Progress Notes (Signed)
FMTS Attending Note Patient seen and examined by me today, discussed with resident team and I agree with resident note for today. Dalbert Mayotte, MD

## 2012-09-25 NOTE — Progress Notes (Signed)
Physical Therapy Treatment Patient Details Name: Ryan [name] MRN: [id number] DOB: [birth year] Today's Date: 09/25/2012 Time: [id number] PT Time Calculation (min): 25 min  PT Assessment / Plan / Recommendation Comments on Treatment Session  Pt admitted with sepsis from multiple sources, R wrist I&D, C6-T1diskitis, pericarditis, UTI. Pt progressing well and able to ambulate today and if ok with MD will attempt platform RW next session. Pt encouraged to continue HEP throughout day to maximize strength and function. Pt also stating that he has been having weakness in RLE off and on for quite some time.                              Recommendations for Other Services    Frequency     Plan Discharge plan remains appropriate;Frequency remains appropriate    Precautions / Restrictions Precautions Precautions: Fall Restrictions RUE Weight Bearing: Non weight bearing   Pertinent Vitals/Pain No pain HR 98-104 with activity    Mobility  Bed Mobility Bed Mobility: Not assessed Transfers Transfers: Sit to Stand;Stand to Sit Sit to Stand: From chair/3-in-1;1: +2 Total assist;With armrests Sit to Stand: Patient Percentage: 70% Stand to Sit: 3: Mod assist;To chair/3-in-1;With armrests Details for Transfer Assistance: Cueing for use of LUE to push through armrest and to reach for armrest with sitting. Cueing to scoot forward and for anterior weight shift Ambulation/Gait Ambulation/Gait Assistance: 1: +2 Total assist Ambulation/Gait: Patient Percentage: 60% Ambulation Distance (Feet): 30 Feet Assistive device: 2 person hand held assist (LUE through hand pressure, RUE with propping on elbow on PT ) Ambulation/Gait Assistance Details: cueing throughout to extend trunk, look up and to widen BOS as pt with narrow BOS on initial standing with tendency for flexion and posterior lean Gait Pattern: Decreased stride length;Step-through pattern;Trunk flexed Gait velocity: decreased Stairs: No      Exercises General Exercises - Lower Extremity Short Arc Quad: AROM;Right;15 reps;Seated Long Arc Quad: AROM;15 reps;Left;Seated Hip Flexion/Marching: AROM;10 reps;20 reps;Right;Left;Seated (x 10 on RLE, x 20 on LLE)   PT Diagnosis:    PT Problem List:   PT Treatment Interventions:     PT Goals Acute Rehab PT Goals PT Goal: Sit to Stand - Progress: Progressing toward goal PT Goal: Stand to Sit - Progress: Progressing toward goal Pt will Stand: with supervision;3 - 5 min;with unilateral upper extremity support PT Goal: Stand - Progress: Updated due to goal met Pt will Ambulate: 16 - 50 feet;with least restrictive assistive device;with mod assist PT Goal: Ambulate - Progress: Updated due to goal met  Visit Information  Last PT Received On: 09/25/12 Assistance Needed: +2    Subjective Data  Subjective: "I've got a lot of work to do"   Cognition  Area of Impairment: Safety/judgement Arousal/Alertness: Awake/alert Orientation Level: Appears intact for tasks assessed Behavior During Session: [facility] for tasks performed Safety/Judgement: Decreased safety judgement for tasks assessed Safety/Judgement - Other Comments: pt with continued cueing for balance and posture    Balance  Static Standing Balance Static Standing - Balance Support: Left upper extremity supported Static Standing - Level of Assistance: 4: Min assist Static Standing - Comment/# of Minutes: 3 with cueing for posture, positioning and BOS   End of Session PT - End of Session Equipment Utilized During Treatment: Gait belt Activity Tolerance: Patient tolerated treatment well Patient left: in chair;with call bell/phone within reach   GP     Melford Aase 09/25/2012, 3:27 PM Elwyn Reach, [place]

## 2012-09-25 NOTE — Clinical Social Work Note (Signed)
Clinical Social Worker followed up with patient on bed offers received. So far, patient only has one bed offer, [place]. CSW provided patient with SNF packet and the responses received so far from the referrals that were faxed. CSW will continue to follow.   Leandro Reasoner MSW, [place]

## 2012-09-25 NOTE — Progress Notes (Signed)
Patient Name: Ryan [name] Date of Encounter: 09/25/2012    SUBJECTIVE: He says he tasted food better this AM. He ate a little more. He denies dyspnea. Temp up to 100F this AM. Diarrhea is better.  TELEMETRY:   NSR: Filed Vitals:   09/25/12 0307 09/25/12 0606 09/25/12 0802 09/25/12 0803  BP: 149/52 137/42  150/52  Pulse:      Temp: 100 F (37.8 C)  98 F (36.7 C)   TempSrc: Oral  Oral   Resp: 18  18   Height:      Weight:      SpO2: 96%  91%     Intake/Output Summary (Last 24 hours) at 09/25/12 1141 Last data filed at 09/25/12 0602  Gross per 24 hour  Intake    920 ml  Output    850 ml  Net     70 ml    LABS: Basic Metabolic Panel:  Basename 09/25/12 0340 09/24/12 0610  NA 137 136  K 3.4* 3.4*  CL 101 102  CO2 27 23  GLUCOSE 103* 108*  BUN 11 13  CREATININE 0.78 0.77  CALCIUM 8.3* 8.2*  MG -- --  PHOS -- --   CBC:  Basename 09/25/12 0340 09/24/12 0610  WBC 14.3* 13.2*  NEUTROABS -- --  HGB 8.5* 8.8*  HCT 25.6* 26.5*  MCV 65.6* 65.4*  PLT 371 439*   Radiology/Studies:  No new data  Physical Exam: Blood pressure 150/52, pulse 80, temperature 98 F (36.7 C), temperature source Oral, resp. rate 18, height 6' (1.829 m), weight 91.8 kg (202 lb 6.1 oz), SpO2 91.00%. Weight change:    2 component rub. 3/6 holosystolic murmur MR radiating to the left and right base.  Lungs clear to auscultation and percussion.  Neuro intact.  ASSESSMENT:  1. Hypokalemia 2. Significant MR 3. Persistent pericarditis 4. Acute diastolic HF, stable. 5. Diarrhea better.  Plan:  1. Replete potassium 2. MVR tomorrow.  Demetrios Isaacs 09/25/2012, 11:41 AM

## 2012-09-25 NOTE — Progress Notes (Signed)
Family Medicine Teaching Service Daily Progress Note Service Page: [phone]  Patient Assessment: 67 y.o. year old male with acute encephalopathy (now resolved) secondary to sepsis from multiple sources, s/p I&D of R wrist and R MTP lesion (with HW removal), Multiple small R hemisphere infarcts that are likely septic emboli, C6-T1 Diskitis, pericarditis,  Endocarditis, and E. Coli UTI (s/p treatment).   Subjective:  Indigestion resolved today, no current pain. His off and on dyspnea is not bothering him right now.   Objective: Temp:  [97.8 F (36.6 C)-100 F (37.8 C)] 98 F (36.7 C) (01/06 0802) Pulse Rate:  [75-111] 80  (01/05 2348) Resp:  [15-20] 18  (01/06 0802) BP: (128-149)/(42-66) 137/42 mmHg (01/06 0606) SpO2:  [91 %-100 %] 91 % (01/06 0802)  Exam: Gen: NAD, alert, cooperative with exam, lying in bed HEENT: NCAT, EOMI, MMM CV: Tachy but regular, I do not appreciate a murmur. S3 present Resp: CTABL, no wheezes, non-labored Abd: SNTND, BS+ Ext: No edema, warm,   - R foot with approcx 2cm circular dry black/necrotic appearing lesion with sutures in place  - R wrist approx 3-4 cm long by 4-5 mm wide post-surgical wound without erythema or drainage Neuro: Alert and oriented times 3, face symmetric, speaks easily, No gross deficits  I have reviewed the patient's medications, labs, imaging, and diagnostic testing.  Notable results are summarized below.  CBC BMET   Lab 09/25/12 0340 09/24/12 0610 09/23/12 0500  WBC 14.3* 13.2* 14.1*  HGB 8.5* 8.8* 9.1*  HCT 25.6* 26.5* 27.1*  PLT 371 439* 467*    Lab 09/25/12 0340 09/24/12 0610 09/23/12 0500  NA 137 136 135  K 3.4* 3.4* 3.6  CL 101 102 102  CO2 27 23 26   BUN 11 13 16   CREATININE 0.78 0.77 0.84  GLUCOSE 103* 108* 94  CALCIUM 8.3* 8.2* 8.7    Blood Culture 12/26- No growth- final Blood culture 12/30- No growth- final C. Diff 1/4 - Pending  Imaging/Diagnostic Tests: TEE 09/18/2012 - The estimated ejection fraction  was in the range of 60% to 65%. - Aortic valve: No evidence of vegetation. - Mitral valve: Large 2.5cm diameter vegetation/ - Left atrium: No evidence of thrombus in the atrial cavity or appendage. - Right atrium: No evidence of thrombus in the atrial cavity or appendage. - Tricuspid valve: No evidence of vegetation. - Pulmonic valve: No evidence of vegetation. - Pericardium, extracardiac: A small, free-flowing pericardial effusion was identified along the right atrial free wall.  MRI Head 12/27 Several small areas of acute infarction involving the right  hemisphere including portions of the; right occipital lobe, right  temporal lobe, right opercular region, posterior right frontal lobe  and right parietal lobe.  MRI Cervical Spine 12/27 C6-7 diskitis with spread of infection to the adjacent facet  joints, spinous process and interspinous ligaments/posterior  paraspinal musculature as discussed above. Epidural abscess  suspected although evaluation limited without contrast.  Plan: Ryan [name] is a 67 y.o. year old male with acute encephalopathy secondary to sepsis from multiple sources.    CV- Stable - Cardiology on board, greatly appreciate recommendations - Continue Lasix 40mg  PO for now, if SOB increases may increase diuresis.  Currently appears euvolemic - Heart cath on 09/21/2012, Findings:  - Widely patent coronary arteries, pulmonary HTN with PCWP of 94mmHg  - CTS planning mitral valve replacement on Tuesday  - PFTs performed 1/3 per Dr, Cyndia Bent- pending results  Pericarditis, Afib with RVR - Improving, Likely due to endocarditis -  causing Afib, currently rhythm controlled with amiodarone - TEE on 12/30 confirmed MV vegetation listed below and slight improvement of pericarditis  Acute Diastolic Heart failure - secondary to mitral regurgitation caused by large vegetation - Lasix 40 mg PO Daily (IV stopped 1/3) - Strict I/O, daily weights - Net positive yesterday, if  SOB worsens, will increase to 40 BID.  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  - ID, cardiology, and CT surgery following and treating- appreciate management and reccs - MVR tomorrow  MSSA Bacteremia - Blood Cx 12/23 shows pan sensitive MSSA, Repeat from 12/26 NGTD, Repeated on 09/18/2012- NGTD - R wrist   - OR by Hand surgery on 12/26 for debridement- Cx with mod MSSA  - Hand surgery signed off - R MTP-   - Debridment and hardware removal on 12/26   - Ortho signed off - Continue to trend CBC and ESR  - WBC up to 13.7 from 11.0 1 day ago  - ESR increased 60 from 66 1day ago  - Hgb stable around 10 - Wound care consulted for dressing changes since ortho signed off.  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  - R ABI 0.48 on 1/2/214, L ABI WNL - No heparin currently due to cerebral emboli - Vascular plans to perform LE angio after clearing infection with Brabham MD  Acute Kidney Injury.  - Resolved, follow daily BMET  Elevated Transaminases -  - Pt AST:ALT 2:1 consistent with alcoholic picture. EtOH on admission < 11  - Recheck LFTs tomorrow   Newly Dx DM II - A1C 6.5 on check  - Consider sensitive SSI if starts to develop hyperglycemia, Blood glucose fine for now.   FEN/GI:  - Carb modified diet  - KVO NS - Strict I/O  Prophylaxis: SCD Disposition: Step down for now, CIR vs SNF with PT after workup.   Kenn File,  MD 09/25/2012, 9:42 AM

## 2012-09-26 ENCOUNTER — Encounter (HOSPITAL_COMMUNITY): Payer: Self-pay | Admitting: *Deleted

## 2012-09-26 ENCOUNTER — Inpatient Hospital Stay (HOSPITAL_COMMUNITY): Payer: Medicare PPO

## 2012-09-26 ENCOUNTER — Inpatient Hospital Stay (HOSPITAL_COMMUNITY): Payer: Medicare PPO | Admitting: *Deleted

## 2012-09-26 ENCOUNTER — Encounter (HOSPITAL_COMMUNITY)
Admission: EM | Disposition: A | Discharge status: Skilled Nursing Facility | Payer: Self-pay | Source: Home / Self Care | Attending: Family Medicine

## 2012-09-26 DIAGNOSIS — I059 Rheumatic mitral valve disease, unspecified: Secondary | ICD-10-CM

## 2012-09-26 HISTORY — PX: MITRAL VALVE REPLACEMENT: SHX147

## 2012-09-26 HISTORY — PX: INTRAOPERATIVE TRANSESOPHAGEAL ECHOCARDIOGRAM: SHX5062

## 2012-09-26 LAB — POCT I-STAT 3, ART BLOOD GAS (G3+)
Acid-Base Excess: 1 mmol/L (ref 0.0–2.0)
Acid-Base Excess: 2 mmol/L (ref 0.0–2.0)
Acid-Base Excess: 1 mmol/L (ref 0.0–2.0)
Bicarbonate: 25.9 mEq/L — ABNORMAL HIGH (ref 20.0–24.0)
Bicarbonate: 26.7 mEq/L — ABNORMAL HIGH (ref 20.0–24.0)
Bicarbonate: 25.8 mEq/L — ABNORMAL HIGH (ref 20.0–24.0)
Bicarbonate: 25.7 mEq/L — ABNORMAL HIGH (ref 20.0–24.0)
Bicarbonate: 24.5 mEq/L — ABNORMAL HIGH (ref 20.0–24.0)
O2 Saturation: 98 %
O2 Saturation: 100 %
O2 Saturation: 100 %
O2 Saturation: 95 %
O2 Saturation: 99 %
Patient temperature: 35.4
Patient temperature: 37
TCO2: 27 mmol/L (ref 0–100)
TCO2: 28 mmol/L (ref 0–100)
TCO2: 27 mmol/L (ref 0–100)
TCO2: 27 mmol/L (ref 0–100)
TCO2: 26 mmol/L (ref 0–100)
pCO2 arterial: 41.7 mmHg (ref 35.0–45.0)
pCO2 arterial: 43.4 mmHg (ref 35.0–45.0)
pCO2 arterial: 38.8 mmHg (ref 35.0–45.0)
pCO2 arterial: 42.1 mmHg (ref 35.0–45.0)
pCO2 arterial: 37.3 mmHg (ref 35.0–45.0)
pH, Arterial: 7.401 (ref 7.350–7.450)
pH, Arterial: 7.398 (ref 7.350–7.450)
pH, Arterial: 7.431 (ref 7.350–7.450)
pH, Arterial: 7.386 (ref 7.350–7.450)
pH, Arterial: 7.425 (ref 7.350–7.450)
pO2, Arterial: 108 mmHg — ABNORMAL HIGH (ref 80.0–100.0)
pO2, Arterial: 406 mmHg — ABNORMAL HIGH (ref 80.0–100.0)
pO2, Arterial: 177 mmHg — ABNORMAL HIGH (ref 80.0–100.0)
pO2, Arterial: 69 mmHg — ABNORMAL LOW (ref 80.0–100.0)
pO2, Arterial: 138 mmHg — ABNORMAL HIGH (ref 80.0–100.0)

## 2012-09-26 LAB — GLUCOSE, CAPILLARY
Glucose-Capillary: 114 mg/dL — ABNORMAL HIGH (ref 70–99)
Glucose-Capillary: 114 mg/dL — ABNORMAL HIGH (ref 70–99)
Glucose-Capillary: 109 mg/dL — ABNORMAL HIGH (ref 70–99)
Glucose-Capillary: 100 mg/dL — ABNORMAL HIGH (ref 70–99)
Glucose-Capillary: 107 mg/dL — ABNORMAL HIGH (ref 70–99)
Glucose-Capillary: 115 mg/dL — ABNORMAL HIGH (ref 70–99)

## 2012-09-26 LAB — BASIC METABOLIC PANEL
BUN: 11 mg/dL (ref 6–23)
CO2: 26 mEq/L (ref 19–32)
Calcium: 8.5 mg/dL (ref 8.4–10.5)
Chloride: 102 mEq/L (ref 96–112)
Creatinine, Ser: 0.8 mg/dL (ref 0.50–1.35)
GFR calc Af Amer: >90 mL/min (ref >90)
GFR calc non Af Amer: 90 mL/min — ABNORMAL LOW (ref >90)
Glucose, Bld: 99 mg/dL (ref 70–99)
Potassium: 3.6 mEq/L (ref 3.5–5.1)
Sodium: 137 mEq/L (ref 135–145)

## 2012-09-26 LAB — POCT I-STAT 4, (NA,K, GLUC, HGB,HCT)
Glucose, Bld: 104 mg/dL — ABNORMAL HIGH (ref 70–99)
Glucose, Bld: 102 mg/dL — ABNORMAL HIGH (ref 70–99)
Glucose, Bld: 98 mg/dL (ref 70–99)
Glucose, Bld: 104 mg/dL — ABNORMAL HIGH (ref 70–99)
Glucose, Bld: 122 mg/dL — ABNORMAL HIGH (ref 70–99)
Glucose, Bld: 148 mg/dL — ABNORMAL HIGH (ref 70–99)
Glucose, Bld: 131 mg/dL — ABNORMAL HIGH (ref 70–99)
HCT: 27 % — ABNORMAL LOW (ref 39.0–52.0)
HCT: 23 % — ABNORMAL LOW (ref 39.0–52.0)
HCT: 22 % — ABNORMAL LOW (ref 39.0–52.0)
HCT: 23 % — ABNORMAL LOW (ref 39.0–52.0)
HCT: 22 % — ABNORMAL LOW (ref 39.0–52.0)
HCT: 22 % — ABNORMAL LOW (ref 39.0–52.0)
HCT: 31 % — ABNORMAL LOW (ref 39.0–52.0)
Hemoglobin: 9.2 g/dL — ABNORMAL LOW (ref 13.0–17.0)
Hemoglobin: 7.8 g/dL — ABNORMAL LOW (ref 13.0–17.0)
Hemoglobin: 7.5 g/dL — ABNORMAL LOW (ref 13.0–17.0)
Hemoglobin: 7.8 g/dL — ABNORMAL LOW (ref 13.0–17.0)
Hemoglobin: 7.5 g/dL — ABNORMAL LOW (ref 13.0–17.0)
Hemoglobin: 7.5 g/dL — ABNORMAL LOW (ref 13.0–17.0)
Hemoglobin: 10.5 g/dL — ABNORMAL LOW (ref 13.0–17.0)
Potassium: 3.6 mEq/L (ref 3.5–5.1)
Potassium: 3.7 mEq/L (ref 3.5–5.1)
Potassium: 4 mEq/L (ref 3.5–5.1)
Potassium: 4.1 mEq/L (ref 3.5–5.1)
Potassium: 6.2 mEq/L — ABNORMAL HIGH (ref 3.5–5.1)
Potassium: 4.3 mEq/L (ref 3.5–5.1)
Potassium: 4.1 mEq/L (ref 3.5–5.1)
Sodium: 136 mEq/L (ref 135–145)
Sodium: 136 mEq/L (ref 135–145)
Sodium: 137 mEq/L (ref 135–145)
Sodium: 134 mEq/L — ABNORMAL LOW (ref 135–145)
Sodium: 131 mEq/L — ABNORMAL LOW (ref 135–145)
Sodium: 136 mEq/L (ref 135–145)
Sodium: 136 mEq/L (ref 135–145)

## 2012-09-26 LAB — CBC
HCT: 25.6 % — ABNORMAL LOW (ref 39.0–52.0)
HCT: 28.2 % — ABNORMAL LOW (ref 39.0–52.0)
HCT: 27.2 % — ABNORMAL LOW (ref 39.0–52.0)
Hemoglobin: 8.5 g/dL — ABNORMAL LOW (ref 13.0–17.0)
Hemoglobin: 9.9 g/dL — ABNORMAL LOW (ref 13.0–17.0)
Hemoglobin: 9.5 g/dL — ABNORMAL LOW (ref 13.0–17.0)
MCH: 21.9 pg — ABNORMAL LOW (ref 26.0–34.0)
MCH: 24.8 pg — ABNORMAL LOW (ref 26.0–34.0)
MCH: 24.4 pg — ABNORMAL LOW (ref 26.0–34.0)
MCHC: 33.2 g/dL (ref 30.0–36.0)
MCHC: 35.1 g/dL (ref 30.0–36.0)
MCHC: 34.9 g/dL (ref 30.0–36.0)
MCV: 65.8 fL — ABNORMAL LOW (ref 78.0–100.0)
MCV: 70.7 fL — ABNORMAL LOW (ref 78.0–100.0)
MCV: 69.9 fL — ABNORMAL LOW (ref 78.0–100.0)
Platelets: 364 10*3/uL (ref 150–400)
Platelets: 273 10*3/uL (ref 150–400)
Platelets: 284 10*3/uL (ref 150–400)
RBC: 3.89 MIL/uL — ABNORMAL LOW (ref 4.22–5.81)
RBC: 3.99 MIL/uL — ABNORMAL LOW (ref 4.22–5.81)
RBC: 3.89 MIL/uL — ABNORMAL LOW (ref 4.22–5.81)
RDW: 15.8 % — ABNORMAL HIGH (ref 11.5–15.5)
RDW: 18.5 % — ABNORMAL HIGH (ref 11.5–15.5)
RDW: 18.4 % — ABNORMAL HIGH (ref 11.5–15.5)
WBC: 15.5 10*3/uL — ABNORMAL HIGH (ref 4.0–10.5)
WBC: 33.6 10*3/uL — ABNORMAL HIGH (ref 4.0–10.5)
WBC: 24.7 10*3/uL — ABNORMAL HIGH (ref 4.0–10.5)

## 2012-09-26 LAB — POCT I-STAT, CHEM 8
BUN: 15 mg/dL (ref 6–23)
Calcium, Ion: 1.22 mmol/L (ref 1.13–1.30)
Chloride: 104 mEq/L (ref 96–112)
Creatinine, Ser: 1 mg/dL (ref 0.50–1.35)
Glucose, Bld: 119 mg/dL — ABNORMAL HIGH (ref 70–99)
HCT: 27 % — ABNORMAL LOW (ref 39.0–52.0)
Hemoglobin: 9.2 g/dL — ABNORMAL LOW (ref 13.0–17.0)
Potassium: 4.5 mEq/L (ref 3.5–5.1)
Sodium: 137 mEq/L (ref 135–145)
TCO2: 25 mmol/L (ref 0–100)

## 2012-09-26 LAB — SEDIMENTATION RATE: Sed Rate: 61 mm/hr — ABNORMAL HIGH (ref 0–16)

## 2012-09-26 LAB — MAGNESIUM: Magnesium: 3 mg/dL — ABNORMAL HIGH (ref 1.5–2.5)

## 2012-09-26 LAB — PROTIME-INR
INR: 1.77 — ABNORMAL HIGH (ref 0.00–1.49)
Prothrombin Time: 20 seconds — ABNORMAL HIGH (ref 11.6–15.2)

## 2012-09-26 LAB — HEMOGLOBIN AND HEMATOCRIT, BLOOD
HCT: 21.5 % — ABNORMAL LOW (ref 39.0–52.0)
Hemoglobin: 7.3 g/dL — ABNORMAL LOW (ref 13.0–17.0)

## 2012-09-26 LAB — PLATELET COUNT: Platelets: 139 10*3/uL — ABNORMAL LOW (ref 150–400)

## 2012-09-26 LAB — CREATININE, SERUM
Creatinine, Ser: 0.94 mg/dL (ref 0.50–1.35)
GFR calc Af Amer: >90 mL/min (ref >90)
GFR calc non Af Amer: 84 mL/min — ABNORMAL LOW (ref >90)

## 2012-09-26 LAB — APTT: aPTT: 40 seconds — ABNORMAL HIGH (ref 24–37)

## 2012-09-26 SURGERY — REPLACEMENT, MITRAL VALVE
Anesthesia: General | Site: Chest | Wound class: Clean

## 2012-09-26 MED ORDER — OXYCODONE HCL 5 MG PO TABS
5.0000 mg | ORAL_TABLET | ORAL | Status: DC | PRN
  Administered 2012-09-27: 5 mg via ORAL
  Administered 2012-09-27: 10 mg via ORAL
  Administered 2012-09-27: 5 mg via ORAL
  Administered 2012-09-28 – 2012-09-29 (×7): 10 mg via ORAL
  Filled 2012-09-26 (×5): qty 2
  Filled 2012-09-26: qty 1
  Filled 2012-09-26 (×2): qty 2
  Filled 2012-09-26: qty 1
  Filled 2012-09-26: qty 2

## 2012-09-26 MED ORDER — LIDOCAINE HCL (CARDIAC) 20 MG/ML IV SOLN
INTRAVENOUS | Status: DC | PRN
  Administered 2012-09-26: 40 mg via INTRAVENOUS

## 2012-09-26 MED ORDER — ROCURONIUM BROMIDE 100 MG/10ML IV SOLN
INTRAVENOUS | Status: DC | PRN
  Administered 2012-09-26 (×2): 20 mg via INTRAVENOUS
  Administered 2012-09-26: 30 mg via INTRAVENOUS

## 2012-09-26 MED ORDER — MAGNESIUM SULFATE 40 MG/ML IJ SOLN
4.0000 g | Freq: Once | INTRAMUSCULAR | Status: AC
  Administered 2012-09-26: 4 g via INTRAVENOUS
  Filled 2012-09-26: qty 100

## 2012-09-26 MED ORDER — SODIUM CHLORIDE 0.9 % IJ SOLN: 3.0000 mL | Freq: Two times a day (BID) | INTRAMUSCULAR | Status: DC

## 2012-09-26 MED ORDER — METOPROLOL TARTRATE 25 MG/10 ML ORAL SUSPENSION
12.5000 mg | Freq: Two times a day (BID) | ORAL | Status: DC
  Filled 2012-09-26 (×3): qty 5

## 2012-09-26 MED ORDER — ACETAMINOPHEN 500 MG PO TABS
1000.0000 mg | ORAL_TABLET | Freq: Four times a day (QID) | ORAL | Status: DC
  Administered 2012-09-28 – 2012-09-29 (×6): 1000 mg via ORAL
  Filled 2012-09-26 (×12): qty 2

## 2012-09-26 MED ORDER — HEPARIN SODIUM (PORCINE) 1000 UNIT/ML IJ SOLN
INTRAMUSCULAR | Status: DC | PRN
  Administered 2012-09-26: 28000 [IU] via INTRAVENOUS

## 2012-09-26 MED ORDER — BISACODYL 10 MG RE SUPP: 10.0000 mg | Freq: Every day | RECTAL | Status: DC

## 2012-09-26 MED ORDER — PROTAMINE SULFATE 10 MG/ML IV SOLN
INTRAVENOUS | Status: DC | PRN
  Administered 2012-09-26: 200 mg via INTRAVENOUS

## 2012-09-26 MED ORDER — DEXTROSE 5 % IV SOLN
0.0000 ug/min | INTRAVENOUS | Status: DC
  Administered 2012-09-26: 35 ug/min via INTRAVENOUS
  Administered 2012-09-26: 50 ug/min via INTRAVENOUS
  Administered 2012-09-27: 10 ug/min via INTRAVENOUS
  Administered 2012-09-27: 45 ug/min via INTRAVENOUS
  Filled 2012-09-26 (×3): qty 2

## 2012-09-26 MED ORDER — LACTATED RINGERS IV SOLN
INTRAVENOUS | Status: DC
  Administered 2012-09-26: 15:00:00 via INTRAVENOUS

## 2012-09-26 MED ORDER — PANTOPRAZOLE SODIUM 40 MG PO TBEC
40.0000 mg | DELAYED_RELEASE_TABLET | Freq: Every day | ORAL | Status: DC
  Administered 2012-09-28 – 2012-09-29 (×2): 40 mg via ORAL
  Filled 2012-09-26 (×2): qty 1

## 2012-09-26 MED ORDER — PROPOFOL 10 MG/ML IV BOLUS
INTRAVENOUS | Status: DC | PRN
  Administered 2012-09-26: 100 mg via INTRAVENOUS

## 2012-09-26 MED ORDER — SODIUM CHLORIDE 0.9 % IV SOLN: 250.0000 mL | INTRAVENOUS | Status: DC

## 2012-09-26 MED ORDER — ASPIRIN 81 MG PO CHEW: 324.0000 mg | CHEWABLE_TABLET | Freq: Every day | ORAL | Status: DC

## 2012-09-26 MED ORDER — INSULIN REGULAR BOLUS VIA INFUSION
0.0000 [IU] | Freq: Three times a day (TID) | INTRAVENOUS | Status: DC
  Filled 2012-09-26: qty 10

## 2012-09-26 MED ORDER — POTASSIUM CHLORIDE 10 MEQ/50ML IV SOLN: 10.0000 meq | INTRAVENOUS | Status: AC

## 2012-09-26 MED ORDER — SODIUM CHLORIDE 0.9 % IJ SOLN: 3.0000 mL | INTRAMUSCULAR | Status: DC | PRN

## 2012-09-26 MED ORDER — NITROGLYCERIN IN D5W 200-5 MCG/ML-% IV SOLN: 0.0000 ug/min | INTRAVENOUS | Status: DC

## 2012-09-26 MED ORDER — ASPIRIN EC 325 MG PO TBEC
325.0000 mg | DELAYED_RELEASE_TABLET | Freq: Every day | ORAL | Status: DC
  Administered 2012-09-27 – 2012-09-28 (×2): 325 mg via ORAL
  Filled 2012-09-26 (×3): qty 1

## 2012-09-26 MED ORDER — SODIUM CHLORIDE 0.9 % IV SOLN: INTRAVENOUS | Status: DC

## 2012-09-26 MED ORDER — DEXMEDETOMIDINE HCL IN NACL 200 MCG/50ML IV SOLN
0.1000 ug/kg/h | INTRAVENOUS | Status: DC
  Administered 2012-09-26: 0.7 ug/kg/h via INTRAVENOUS

## 2012-09-26 MED ORDER — FENTANYL CITRATE 0.05 MG/ML IJ SOLN
INTRAMUSCULAR | Status: DC | PRN
  Administered 2012-09-26: 1000 ug via INTRAVENOUS
  Administered 2012-09-26 (×2): 250 ug via INTRAVENOUS

## 2012-09-26 MED ORDER — THROMBIN 20000 UNITS EX SOLR
OROMUCOSAL | Status: DC | PRN
  Administered 2012-09-26 (×3): via TOPICAL

## 2012-09-26 MED ORDER — SODIUM CHLORIDE 0.9 % IV SOLN
INTRAVENOUS | Status: DC
  Filled 2012-09-26 (×2): qty 1

## 2012-09-26 MED ORDER — LACTATED RINGERS IV SOLN: 500.0000 mL | Freq: Once | INTRAVENOUS | Status: AC | PRN

## 2012-09-26 MED ORDER — VECURONIUM BROMIDE 10 MG IV SOLR
INTRAVENOUS | Status: DC | PRN
  Administered 2012-09-26 (×2): 10 mg via INTRAVENOUS

## 2012-09-26 MED ORDER — FAMOTIDINE IN NACL 20-0.9 MG/50ML-% IV SOLN
20.0000 mg | Freq: Two times a day (BID) | INTRAVENOUS | Status: DC
  Administered 2012-09-26: 20 mg via INTRAVENOUS

## 2012-09-26 MED ORDER — ACETAMINOPHEN 160 MG/5ML PO SOLN
975.0000 mg | Freq: Four times a day (QID) | ORAL | Status: DC
  Administered 2012-09-27: 975 mg
  Filled 2012-09-26: qty 40.6

## 2012-09-26 MED ORDER — MORPHINE SULFATE 2 MG/ML IJ SOLN
1.0000 mg | INTRAMUSCULAR | Status: AC | PRN
  Administered 2012-09-26 (×2): 1 mg via INTRAVENOUS
  Filled 2012-09-26: qty 1

## 2012-09-26 MED ORDER — ALBUMIN HUMAN 5 % IV SOLN
250.0000 mL | INTRAVENOUS | Status: AC | PRN
  Administered 2012-09-26 (×3): 250 mL via INTRAVENOUS
  Filled 2012-09-26: qty 250

## 2012-09-26 MED ORDER — ONDANSETRON HCL 4 MG/2ML IJ SOLN: 4.0000 mg | Freq: Four times a day (QID) | INTRAMUSCULAR | Status: DC | PRN

## 2012-09-26 MED ORDER — THROMBIN 20000 UNITS EX SOLR
CUTANEOUS | Status: DC | PRN
  Administered 2012-09-26: 20000 [IU] via TOPICAL

## 2012-09-26 MED ORDER — VANCOMYCIN HCL IN DEXTROSE 1-5 GM/200ML-% IV SOLN
1000.0000 mg | Freq: Once | INTRAVENOUS | Status: AC
  Administered 2012-09-26: 1000 mg via INTRAVENOUS
  Filled 2012-09-26: qty 200

## 2012-09-26 MED ORDER — PANTOPRAZOLE SODIUM 40 MG PO TBEC: 40.0000 mg | DELAYED_RELEASE_TABLET | Freq: Every day | ORAL | Status: DC

## 2012-09-26 MED ORDER — BISACODYL 5 MG PO TBEC
10.0000 mg | DELAYED_RELEASE_TABLET | Freq: Every day | ORAL | Status: DC
  Administered 2012-09-28 – 2012-09-29 (×2): 10 mg via ORAL
  Filled 2012-09-26 (×2): qty 2

## 2012-09-26 MED ORDER — LACTATED RINGERS IV SOLN
INTRAVENOUS | Status: DC | PRN
  Administered 2012-09-26: 07:00:00 via INTRAVENOUS

## 2012-09-26 MED ORDER — MIDAZOLAM HCL 5 MG/5ML IJ SOLN
INTRAMUSCULAR | Status: DC | PRN
  Administered 2012-09-26: 3 mg via INTRAVENOUS
  Administered 2012-09-26 (×2): 2 mg via INTRAVENOUS

## 2012-09-26 MED ORDER — DOCUSATE SODIUM 100 MG PO CAPS
200.0000 mg | ORAL_CAPSULE | Freq: Every day | ORAL | Status: DC
  Administered 2012-09-28 – 2012-09-29 (×2): 200 mg via ORAL
  Filled 2012-09-26 (×2): qty 2

## 2012-09-26 MED ORDER — METOPROLOL TARTRATE 1 MG/ML IV SOLN: 2.5000 mg | INTRAVENOUS | Status: DC | PRN

## 2012-09-26 MED ORDER — MIDAZOLAM HCL 2 MG/2ML IJ SOLN: 2.0000 mg | INTRAMUSCULAR | Status: DC | PRN

## 2012-09-26 MED ORDER — SODIUM CHLORIDE 0.9 % IV SOLN
0.5000 g/h | Freq: Once | INTRAVENOUS | Status: DC
  Filled 2012-09-26: qty 20

## 2012-09-26 MED ORDER — DEXTROSE 5 % IV SOLN
1.5000 g | Freq: Two times a day (BID) | INTRAVENOUS | Status: DC
  Administered 2012-09-26: 1.5 g via INTRAVENOUS
  Filled 2012-09-26 (×3): qty 1.5

## 2012-09-26 MED ORDER — DOPAMINE-DEXTROSE 3.2-5 MG/ML-% IV SOLN
2.0000 ug/kg/min | INTRAVENOUS | Status: DC
  Administered 2012-09-26: 6 ug/kg/min via INTRAVENOUS
  Filled 2012-09-26: qty 250

## 2012-09-26 MED ORDER — 0.9 % SODIUM CHLORIDE (POUR BTL) OPTIME
TOPICAL | Status: DC | PRN
  Administered 2012-09-26: 6000 mL

## 2012-09-26 MED ORDER — MORPHINE SULFATE 2 MG/ML IJ SOLN
2.0000 mg | INTRAMUSCULAR | Status: DC | PRN
  Administered 2012-09-26: 2 mg via INTRAVENOUS
  Administered 2012-09-27 (×3): 4 mg via INTRAVENOUS
  Administered 2012-09-27: 2 mg via INTRAVENOUS
  Administered 2012-09-27 – 2012-09-28 (×5): 4 mg via INTRAVENOUS
  Filled 2012-09-26 (×6): qty 2
  Filled 2012-09-26 (×2): qty 1
  Filled 2012-09-26 (×2): qty 2

## 2012-09-26 MED ORDER — SODIUM CHLORIDE 0.45 % IV SOLN
INTRAVENOUS | Status: DC
  Administered 2012-09-26: 14:00:00 via INTRAVENOUS

## 2012-09-26 MED ORDER — HEMOSTATIC AGENTS (NO CHARGE) OPTIME
TOPICAL | Status: DC | PRN
  Administered 2012-09-26: 1 via TOPICAL

## 2012-09-26 MED ORDER — DEXMEDETOMIDINE HCL IN NACL 200 MCG/50ML IV SOLN
0.4000 ug/kg/h | INTRAVENOUS | Status: DC
  Filled 2012-09-26: qty 50

## 2012-09-26 MED ORDER — ACETAMINOPHEN 10 MG/ML IV SOLN
1000.0000 mg | Freq: Once | INTRAVENOUS | Status: AC
  Administered 2012-09-26: 1000 mg via INTRAVENOUS
  Filled 2012-09-26: qty 100

## 2012-09-26 MED ORDER — CALCIUM GLUCONATE 10 % IV SOLN
INTRAVENOUS | Status: DC | PRN
  Administered 2012-09-26 (×2): 200 mg via INTRAVENOUS
  Administered 2012-09-26: 100 mg via INTRAVENOUS

## 2012-09-26 MED ORDER — THROMBIN 20000 UNITS EX SOLR
CUTANEOUS | Status: AC
  Filled 2012-09-26: qty 20000

## 2012-09-26 MED ORDER — METOPROLOL TARTRATE 12.5 MG HALF TABLET
12.5000 mg | ORAL_TABLET | Freq: Two times a day (BID) | ORAL | Status: DC
  Filled 2012-09-26 (×3): qty 1

## 2012-09-26 SURGICAL SUPPLY — 69 items
ADAPTER CARDIO PERF ANTE/RETRO (ADAPTER) ×3 IMPLANT
ADPR PRFSN 84XANTGRD RTRGD (ADAPTER) ×2
BAG DECANTER FOR FLEXI CONT (MISCELLANEOUS) ×3 IMPLANT
BLADE STERNUM SYSTEM 6 (BLADE) ×3 IMPLANT
BLADE SURG 15 STRL LF DISP TIS (BLADE) ×2 IMPLANT
BLADE SURG 15 STRL SS (BLADE) ×3
CANISTER SUCTION 2500CC (MISCELLANEOUS) ×3 IMPLANT
CANN PRFSN 3/8XCNCT ST RT ANG (MISCELLANEOUS) ×2
CANNULA ARTERIAL NVNT 3/8 20FR (MISCELLANEOUS) ×1 IMPLANT
CANNULA GUNDRY RCSP 15FR (MISCELLANEOUS) ×3 IMPLANT
CANNULA PRFSN 3/8XCNCT RT ANG (MISCELLANEOUS) IMPLANT
CANNULA VEN MTL TIP RT (MISCELLANEOUS) ×3
CANNULA VRC MALB SNGL STG 36FR (MISCELLANEOUS) IMPLANT
CATH ROBINSON RED A/P 18FR (CATHETERS) ×9 IMPLANT
CATH THORACIC 36FR (CATHETERS) ×3 IMPLANT
CATH THORACIC 36FR RT ANG (CATHETERS) ×3 IMPLANT
CLOTH BEACON ORANGE TIMEOUT ST (SAFETY) ×3 IMPLANT
CONN 1/2X1/2X1/2  BEN (MISCELLANEOUS) ×1
CONN 1/2X1/2X1/2 BEN (MISCELLANEOUS) ×2 IMPLANT
CONN 3/8X1/2 ST GISH (MISCELLANEOUS) ×6 IMPLANT
CONT SPEC 4OZ CLIKSEAL STRL BL (MISCELLANEOUS) ×2 IMPLANT
COVER SURGICAL LIGHT HANDLE (MISCELLANEOUS) ×6 IMPLANT
CRADLE DONUT ADULT HEAD (MISCELLANEOUS) ×3 IMPLANT
DRAPE CARDIOVASCULAR INCISE (DRAPES) ×3
DRAPE SLUSH MACHINE 52X66 (DRAPES) IMPLANT
DRAPE SLUSH/WARMER DISC (DRAPES) IMPLANT
DRAPE SRG 135X102X78XABS (DRAPES) ×2 IMPLANT
DRSG COVADERM 4X14 (GAUZE/BANDAGES/DRESSINGS) ×3 IMPLANT
ELECT CAUTERY BLADE 6.4 (BLADE) ×3 IMPLANT
ELECT REM PT RETURN 9FT ADLT (ELECTROSURGICAL) ×6
ELECTRODE REM PT RTRN 9FT ADLT (ELECTROSURGICAL) ×4 IMPLANT
GLOVE EUDERMIC 7 POWDERFREE (GLOVE) ×6 IMPLANT
GOWN PREVENTION PLUS XLARGE (GOWN DISPOSABLE) ×3 IMPLANT
GOWN STRL NON-REIN LRG LVL3 (GOWN DISPOSABLE) ×12 IMPLANT
HEMOSTAT POWDER SURGIFOAM 1G (HEMOSTASIS) ×9 IMPLANT
HEMOSTAT SURGICEL 2X14 (HEMOSTASIS) ×4 IMPLANT
KIT BASIN OR (CUSTOM PROCEDURE TRAY) ×3 IMPLANT
KIT CATH CPB BARTLE (MISCELLANEOUS) ×3 IMPLANT
KIT ROOM TURNOVER OR (KITS) ×3 IMPLANT
KIT SUCTION CATH 14FR (SUCTIONS) ×3 IMPLANT
LINE VENT (MISCELLANEOUS) ×1 IMPLANT
LOOP VESSEL SUPERMAXI WHITE (MISCELLANEOUS) ×5 IMPLANT
NS IRRIG 1000ML POUR BTL (IV SOLUTION) ×15 IMPLANT
PACK OPEN HEART (CUSTOM PROCEDURE TRAY) ×3 IMPLANT
PAD ARMBOARD 7.5X6 YLW CONV (MISCELLANEOUS) ×6 IMPLANT
SET CARDIOPLEGIA MPS 5001102 (MISCELLANEOUS) ×1 IMPLANT
SPONGE GAUZE 4X4 12PLY (GAUZE/BANDAGES/DRESSINGS) ×6 IMPLANT
SUCKER INTRACARDIAC WEIGHTED (SUCKER) ×1 IMPLANT
SUCKER WEIGHTED FLEX (MISCELLANEOUS) ×3 IMPLANT
SUT BONE WAX W31G (SUTURE) ×3 IMPLANT
SUT ETHIBOND 2 0 SH (SUTURE) ×5 IMPLANT
SUT ETHIBOND 2 0 SH 36X2 (SUTURE) ×2 IMPLANT
SUT ETHIBOND 2 0 V4 (SUTURE) IMPLANT
SUT ETHIBOND 2 0V4 GREEN (SUTURE) IMPLANT
SUT PROLENE 3 0 SH1 36 (SUTURE) ×3 IMPLANT
SUT PROLENE 4 0 RB 1 (SUTURE) ×6
SUT PROLENE 4-0 RB1 .5 CRCL 36 (SUTURE) ×4 IMPLANT
SUT SILK 2 0 SH CR/8 (SUTURE) ×1 IMPLANT
SUT VIC AB 1 CTX 36 (SUTURE) ×6
SUT VIC AB 1 CTX36XBRD ANBCTR (SUTURE) ×4 IMPLANT
SYSTEM SAHARA CHEST DRAIN ATS (WOUND CARE) ×3 IMPLANT
TOWEL OR 17X24 6PK STRL BLUE (TOWEL DISPOSABLE) ×6 IMPLANT
TOWEL OR 17X26 10 PK STRL BLUE (TOWEL DISPOSABLE) ×6 IMPLANT
TRAY FOLEY IC TEMP SENS 14FR (CATHETERS) ×3 IMPLANT
TUBE SUCT INTRACARD DLP 20F (MISCELLANEOUS) ×3 IMPLANT
UNDERPAD 30X30 INCONTINENT (UNDERPADS AND DIAPERS) ×3 IMPLANT
VALVE MAGNA MITRAL 27MM (Prosthesis & Implant Heart) ×1 IMPLANT
VRC MALLEABLE SINGLE STG 36FR (MISCELLANEOUS) ×3
WATER STERILE IRR 1000ML POUR (IV SOLUTION) ×6 IMPLANT

## 2012-09-26 NOTE — Anesthesia Postprocedure Evaluation (Signed)
  Anesthesia Post-op Note  Patient: Ryan [name]  Procedure(s) Performed: Procedure(s) (LRB) with comments: MITRAL VALVE (MV) REPLACEMENT (N/A) INTRAOPERATIVE TRANSESOPHAGEAL ECHOCARDIOGRAM (N/A)  Patient Location: SICU  Anesthesia Type:General  Level of Consciousness: sedated and Patient remains intubated per anesthesia plan  Airway and Oxygen Therapy: Patient remains intubated per anesthesia plan and Patient placed on Ventilator (see vital sign flow sheet for setting)  Post-op Pain: none  Post-op Assessment: Post-op Vital signs reviewed, Patient's Cardiovascular Status Stable, Respiratory Function Stable, Patent Airway, No signs of Nausea or vomiting and Pain level controlled  Post-op Vital Signs: stable  Complications: No apparent anesthesia complications

## 2012-09-26 NOTE — Progress Notes (Signed)
Family Medicine Teaching Service Daily Progress Note Service Page: [phone]  Patient Assessment: 67 y.o. year old male with acute encephalopathy (now resolved) secondary to sepsis from multiple sources, s/p I&D of R wrist and R MTP lesion (with HW removal), Multiple small R hemisphere infarcts that are likely septic emboli, C6-T1 Diskitis, pericarditis,  Endocarditis, and E. Coli UTI (s/p treatment).   Subjective:  Patient in pre-op awaiting surgery this am. Mild nosebleed overnight, no chest pain or dyspnea, no abdominal pain.   Objective: Temp:  [97.9 F (36.6 C)-99.3 F (37.4 C)] 98.1 F (36.7 C) (01/07 0419) Pulse Rate:  [86-104] 86  (01/07 0419) Resp:  [18] 18  (01/07 0419) BP: (138-150)/(51-64) 148/63 mmHg (01/07 0419) SpO2:  [89 %-94 %] 94 % (01/07 0425)  Exam: Gen: NAD, alert, cooperative with exam, lying in bed, R IJ swan catheter in place HEENT: NCAT, EOMI, MMM CV: RRR, 3/6 systolic and diastolic murmur Resp: CTABL, no wheezes, non-labored Abd: SNTND, BS+ Ext: No edema, warm, Bandage on R wrist and R foot clean dry and intact Neuro: Alert and oriented times 3, face symmetric, speaks easily, No gross deficits  I have reviewed the patient's medications, labs, imaging, and diagnostic testing.  Notable results are summarized below.  CBC BMET   Lab 09/26/12 0443 09/25/12 0340 09/24/12 0610  WBC 15.5* 14.3* 13.2*  HGB 8.5* 8.5* 8.8*  HCT 25.6* 25.6* 26.5*  PLT 364 371 439*    Lab 09/26/12 0443 09/25/12 0340 09/24/12 0610  NA 137 137 136  K 3.6 3.4* 3.4*  CL 102 101 102  CO2 26 27 23   BUN 11 11 13   CREATININE 0.80 0.78 0.77  GLUCOSE 99 103* 108*  CALCIUM 8.5 8.3* 8.2*    Blood Culture 12/26- No growth- final Blood culture 12/30- No growth- final C. Diff 1/4 - negative  Imaging/Diagnostic Tests: TEE 09/18/2012 - The estimated ejection fraction was in the range of 60% to 65%. - Aortic valve: No evidence of vegetation. - Mitral valve: Large 2.5cm diameter  vegetation/ - Left atrium: No evidence of thrombus in the atrial cavity or appendage. - Right atrium: No evidence of thrombus in the atrial cavity or appendage. - Tricuspid valve: No evidence of vegetation. - Pulmonic valve: No evidence of vegetation. - Pericardium, extracardiac: A small, free-flowing pericardial effusion was identified along the right atrial free wall.  MRI Head 12/27 Several small areas of acute infarction involving the right  hemisphere including portions of the; right occipital lobe, right  temporal lobe, right opercular region, posterior right frontal lobe  and right parietal lobe.  MRI Cervical Spine 12/27 C6-7 diskitis with spread of infection to the adjacent facet  joints, spinous process and interspinous ligaments/posterior  paraspinal musculature as discussed above. Epidural abscess  suspected although evaluation limited without contrast.  Plan: Nicklus Dockendorf is a 67 y.o. year old male with acute encephalopathy secondary to sepsis from multiple sources.    CV- Stable - Cardiology and CTS on board, greatly appreciate recommendations - Continue Lasix 40mg  PO for now, if SOB increases may increase diuresis.  Currently appears euvolemic - Heart cath on 09/21/2012, Findings:  - Widely patent coronary arteries, pulmonary HTN with PCWP of 79mmHg  - CTS planning mitral valve replacement today   Pericarditis, Afib with RVR - Improving, Likely due to endocarditis - causing Afib, currently rhythm controlled with amiodarone - TEE on 12/30 confirmed MV vegetation listed below and slight improvement of pericarditis  Acute Diastolic Heart failure - secondary to mitral  regurgitation caused by large vegetation - Lasix 40 mg PO Daily (IV stopped 1/3) - Strict I/O, daily weights - Net positive yesterday, if SOB worsens, will increase to 40 BID.  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  - ID, cardiology, and CT surgery following and treating- appreciate management and reccs - MVR today  MSSA Bacteremia - Blood Cx 12/23 shows pan sensitive MSSA, Repeat from 12/26 NGTD, Repeated on 09/18/2012- NGTD - R wrist   - OR by Hand surgery on 12/26 for debridement- Cx with mod MSSA  - Hand surgery signed off - R MTP-   - Debridment and hardware removal on 12/26   - Ortho following - appreciate reccs   - Q 3 days R foot dressings- ortheo to remove sutures in areound 1/13, believe it will likely not heal unless circulation addressed.  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  - R ABI 0.48 on 1/2/214, L ABI WNL - No heparin currently due to cerebral emboli - Vascular plans to perform LE angio after clearing infection with Brabham MD  Acute Kidney Injury.  - Resolved, follow daily BMET  Elevated Transaminases -  - Pt AST:ALT 2:1 consistent with alcoholic picture. EtOH on admission < 11  - Recheck LFTs tomorrow   Newly Dx DM II - A1C 6.5 on check  - Consider sensitive SSI if starts to develop hyperglycemia, Blood glucose fine for now.   FEN/GI:  - Carb modified diet  - KVO NS - Strict I/O  Prophylaxis: SCD Disposition: Step down for now, CIR vs SNF with PT after workup.   Kenn File, MD 09/26/2012, 6:44 AM

## 2012-09-26 NOTE — Progress Notes (Signed)
Pt complained of left nostril dryness and congestion, a small amount of blood noted on tissues. Pt denies picking at nose. Pt states "when my nose get dry and congested it can bleed some." Will continue to monitor.

## 2012-09-26 NOTE — Progress Notes (Signed)
Placed back to full support, unable to lift head and too sleepy.

## 2012-09-26 NOTE — Brief Op Note (Addendum)
09/12/2012 - 09/26/2012  11:50 AM  PATIENT:  Ryan [name]  67 y.o. male  PRE-OPERATIVE DIAGNOSIS:  Mitral valve endocarditis, moderate MR  POST-OPERATIVE DIAGNOSIS:  Mitral valve endocarditis, moderate MR  PROCEDURE:   MITRAL VALVE REPLACEMENT (27 mm pericardial tissue valve)  SURGEON:  Surgeon(s): [name], MD  ASSISTANT: Suzzanne Cloud, PA-C  ANESTHESIA:   general  PATIENT CONDITION:  ICU - intubated and hemodynamically stable.  PRE-OPERATIVE WEIGHT: 92 kg   Findings:  Severe, subacute pericarditis with thick fibrinous layer over visceral and parietal pericardium.  Large vegetation involving anterior and posterior leaflet anterolaterally with large perforation of anterior leaflet. Vegetations on chordae. Annulus not involved.

## 2012-09-26 NOTE — Progress Notes (Signed)
  Echocardiogram Echocardiogram Transesophageal has been performed.  Jaymie Misch 09/26/2012, 9:24 AM

## 2012-09-26 NOTE — Procedures (Signed)
Extubation Procedure Note  Patient Details:   Name: Ojani Farnam DOB: [birth year] MRN: [id number]   Airway Documentation:   Patient extubated to 4 lpm nasal cannula.  VC 900 ml, NIF -25, patient able to lift head off bed 5 seconds.  Patient able to breathe around deflated cuff and vocalize post procedure.  Tolerated well, no complications.   Evaluation  O2 sats: stable throughout Complications: No apparent complications Patient did tolerate procedure well. Bilateral Breath Sounds: Clear;Diminished   Yes  Elisse Pennick, Elwyn Lade 09/26/2012, 10:19 PM

## 2012-09-26 NOTE — Anesthesia Preprocedure Evaluation (Addendum)
Anesthesia Evaluation    Reviewed: Allergy & Precautions, H&P , NPO status , Patient's Chart, lab work & pertinent test results  Airway Mallampati: II TM Distance: >3 FB Neck ROM: Limited    Dental  (+) Edentulous Upper, Poor Dentition and Dental Advisory Given   Pulmonary          Cardiovascular hypertension, + Peripheral Vascular Disease + dysrhythmias Atrial Fibrillation     Neuro/Psych CVA    GI/Hepatic   Endo/Other    Renal/GU      Musculoskeletal   Abdominal   Peds  Hematology   Anesthesia Other Findings Bacterial endocarditis  Reproductive/Obstetrics                          Anesthesia Physical Anesthesia Plan  ASA: III  Anesthesia Plan: General   Post-op Pain Management:    Induction: Intravenous  Airway Management Planned: Oral ETT  Additional Equipment: PA Cath, CVP, Arterial line and TEE  Intra-op Plan:   Post-operative Plan: Post-operative intubation/ventilation  Informed Consent:   Plan Discussed with: CRNA, Anesthesiologist and Surgeon  Anesthesia Plan Comments:         Anesthesia Quick Evaluation

## 2012-09-26 NOTE — Progress Notes (Signed)
Md notified of CI of 1.33, no orders given

## 2012-09-26 NOTE — Transfer of Care (Signed)
Immediate Anesthesia Transfer of Care Note  Patient: Ryan [name]  Procedure(s) Performed: Procedure(s) (LRB) with comments: MITRAL VALVE (MV) REPLACEMENT (N/A) INTRAOPERATIVE TRANSESOPHAGEAL ECHOCARDIOGRAM (N/A)  Patient Location: PACU and ICU  Anesthesia Type:General  Level of Consciousness: unresponsive  Airway & Oxygen Therapy: Patient placed on Ventilator (see vital sign flow sheet for setting)  Post-op Assessment: Report given to PACU RN and Post -op Vital signs reviewed and stable  Post vital signs: Reviewed and stable  Complications: No apparent anesthesia complications

## 2012-09-26 NOTE — Progress Notes (Signed)
Patient's case reviewed and discussed with resident team.  Dalbert Mayotte, MD

## 2012-09-26 NOTE — Preoperative (Signed)
Beta Blockers   Reason not to administer Beta Blockers:Not Applicable 

## 2012-09-26 NOTE — Progress Notes (Signed)
CXR postop showed a left pleural effusion. Therefore a 16F left chest tube was inserted using sterile prep and drape, 1% lidocaine local. 600 cc of serosanguinous effusion drained. CT to 20 cm suction. No complications.

## 2012-09-27 ENCOUNTER — Inpatient Hospital Stay (HOSPITAL_COMMUNITY): Payer: Medicare PPO

## 2012-09-27 ENCOUNTER — Encounter (HOSPITAL_COMMUNITY): Payer: Self-pay | Admitting: Surgery

## 2012-09-27 LAB — CBC
HCT: 25.9 % — ABNORMAL LOW (ref 39.0–52.0)
HCT: 24.5 % — ABNORMAL LOW (ref 39.0–52.0)
Hemoglobin: 8.9 g/dL — ABNORMAL LOW (ref 13.0–17.0)
Hemoglobin: 8.5 g/dL — ABNORMAL LOW (ref 13.0–17.0)
MCH: 23.7 pg — ABNORMAL LOW (ref 26.0–34.0)
MCH: 24.1 pg — ABNORMAL LOW (ref 26.0–34.0)
MCHC: 34.4 g/dL (ref 30.0–36.0)
MCHC: 34.7 g/dL (ref 30.0–36.0)
MCV: 68.9 fL — ABNORMAL LOW (ref 78.0–100.0)
MCV: 69.6 fL — ABNORMAL LOW (ref 78.0–100.0)
Platelets: 257 10*3/uL (ref 150–400)
Platelets: 259 10*3/uL (ref 150–400)
RBC: 3.76 MIL/uL — ABNORMAL LOW (ref 4.22–5.81)
RBC: 3.52 MIL/uL — ABNORMAL LOW (ref 4.22–5.81)
RDW: 18.8 % — ABNORMAL HIGH (ref 11.5–15.5)
RDW: 18.9 % — ABNORMAL HIGH (ref 11.5–15.5)
WBC: 22.6 10*3/uL — ABNORMAL HIGH (ref 4.0–10.5)
WBC: 21.1 10*3/uL — ABNORMAL HIGH (ref 4.0–10.5)

## 2012-09-27 LAB — GLUCOSE, CAPILLARY
Glucose-Capillary: 106 mg/dL — ABNORMAL HIGH (ref 70–99)
Glucose-Capillary: 106 mg/dL — ABNORMAL HIGH (ref 70–99)
Glucose-Capillary: 109 mg/dL — ABNORMAL HIGH (ref 70–99)
Glucose-Capillary: 111 mg/dL — ABNORMAL HIGH (ref 70–99)
Glucose-Capillary: 111 mg/dL — ABNORMAL HIGH (ref 70–99)
Glucose-Capillary: 116 mg/dL — ABNORMAL HIGH (ref 70–99)
Glucose-Capillary: 118 mg/dL — ABNORMAL HIGH (ref 70–99)
Glucose-Capillary: 118 mg/dL — ABNORMAL HIGH (ref 70–99)
Glucose-Capillary: 119 mg/dL — ABNORMAL HIGH (ref 70–99)
Glucose-Capillary: 121 mg/dL — ABNORMAL HIGH (ref 70–99)
Glucose-Capillary: 137 mg/dL — ABNORMAL HIGH (ref 70–99)
Glucose-Capillary: 125 mg/dL — ABNORMAL HIGH (ref 70–99)
Glucose-Capillary: 107 mg/dL — ABNORMAL HIGH (ref 70–99)
Glucose-Capillary: 106 mg/dL — ABNORMAL HIGH (ref 70–99)

## 2012-09-27 LAB — POCT I-STAT, CHEM 8
BUN: 16 mg/dL (ref 6–23)
Calcium, Ion: 1.17 mmol/L (ref 1.13–1.30)
Chloride: 104 mEq/L (ref 96–112)
Creatinine, Ser: 0.9 mg/dL (ref 0.50–1.35)
Glucose, Bld: 107 mg/dL — ABNORMAL HIGH (ref 70–99)
HCT: 25 % — ABNORMAL LOW (ref 39.0–52.0)
Hemoglobin: 8.5 g/dL — ABNORMAL LOW (ref 13.0–17.0)
Potassium: 4.6 mEq/L (ref 3.5–5.1)
Sodium: 136 mEq/L (ref 135–145)
TCO2: 23 mmol/L (ref 0–100)

## 2012-09-27 LAB — POCT I-STAT 3, ART BLOOD GAS (G3+)
Bicarbonate: 24.7 mEq/L — ABNORMAL HIGH (ref 20.0–24.0)
O2 Saturation: 97 %
Patient temperature: 37.1
TCO2: 26 mmol/L (ref 0–100)
pCO2 arterial: 38.9 mmHg (ref 35.0–45.0)
pH, Arterial: 7.411 (ref 7.350–7.450)
pO2, Arterial: 92 mmHg (ref 80.0–100.0)

## 2012-09-27 LAB — PREPARE PLATELET PHERESIS: Unit division: 0

## 2012-09-27 LAB — BASIC METABOLIC PANEL
BUN: 15 mg/dL (ref 6–23)
CO2: 24 mEq/L (ref 19–32)
Calcium: 8.4 mg/dL (ref 8.4–10.5)
Chloride: 101 mEq/L (ref 96–112)
Creatinine, Ser: 0.9 mg/dL (ref 0.50–1.35)
GFR calc Af Amer: >90 mL/min (ref >90)
GFR calc non Af Amer: 85 mL/min — ABNORMAL LOW (ref >90)
Glucose, Bld: 110 mg/dL — ABNORMAL HIGH (ref 70–99)
Potassium: 4.4 mEq/L (ref 3.5–5.1)
Sodium: 134 mEq/L — ABNORMAL LOW (ref 135–145)

## 2012-09-27 LAB — MAGNESIUM
Magnesium: 2.5 mg/dL (ref 1.5–2.5)
Magnesium: 2.4 mg/dL (ref 1.5–2.5)

## 2012-09-27 LAB — CREATININE, SERUM
Creatinine, Ser: 0.95 mg/dL (ref 0.50–1.35)
GFR calc Af Amer: >90 mL/min (ref >90)
GFR calc non Af Amer: 84 mL/min — ABNORMAL LOW (ref >90)

## 2012-09-27 MED ORDER — SODIUM CHLORIDE 0.9 % IJ SOLN
10.0000 mL | INTRAMUSCULAR | Status: DC | PRN
  Administered 2012-09-30 – 2012-10-06 (×2): 10 mL

## 2012-09-27 MED ORDER — INSULIN DETEMIR 100 UNIT/ML ~~LOC~~ SOLN
20.0000 [IU] | Freq: Every day | SUBCUTANEOUS | Status: DC
  Filled 2012-09-27: qty 10

## 2012-09-27 MED ORDER — METOPROLOL TARTRATE 12.5 MG HALF TABLET
12.5000 mg | ORAL_TABLET | Freq: Two times a day (BID) | ORAL | Status: DC
  Administered 2012-09-27: 12.5 mg via ORAL
  Filled 2012-09-27 (×3): qty 1

## 2012-09-27 MED ORDER — TRAMADOL HCL 50 MG PO TABS
50.0000 mg | ORAL_TABLET | ORAL | Status: DC | PRN
  Administered 2012-09-29 – 2012-10-01 (×2): 50 mg via ORAL
  Filled 2012-09-27 (×2): qty 1

## 2012-09-27 MED ORDER — INSULIN ASPART 100 UNIT/ML ~~LOC~~ SOLN
0.0000 [IU] | SUBCUTANEOUS | Status: DC
  Administered 2012-09-27: 2 [IU] via SUBCUTANEOUS

## 2012-09-27 MED ORDER — ENOXAPARIN SODIUM 40 MG/0.4ML ~~LOC~~ SOLN
40.0000 mg | SUBCUTANEOUS | Status: DC
  Administered 2012-09-27 – 2012-09-28 (×2): 40 mg via SUBCUTANEOUS
  Filled 2012-09-27 (×3): qty 0.4

## 2012-09-27 MED FILL — Magnesium Sulfate Inj 50%: INTRAMUSCULAR | Qty: 10 | Status: AC

## 2012-09-27 MED FILL — Potassium Chloride Inj 2 mEq/ML: INTRAVENOUS | Qty: 40 | Status: AC

## 2012-09-27 NOTE — Progress Notes (Signed)
Subjective: 1 Day Post-Op Procedure(s) (LRB): MITRAL VALVE (MV) REPLACEMENT (N/A) INTRAOPERATIVE TRANSESOPHAGEAL ECHOCARDIOGRAM (N/A) Patient reports pain as mild.    Objective: Vital signs in last 24 hours: Temp:  [97.3 F (36.3 C)-99 F (37.2 C)] 97.7 F (36.5 C) (01/08 2103) Pulse Rate:  [52-115] 95  (01/08 2100) Resp:  [16-38] 31  (01/08 2100) BP: (103-179)/(43-78) 175/66 mmHg (01/08 2100) SpO2:  [71 %-100 %] 100 % (01/08 2100) FiO2 (%):  [40 %] 40 % (01/07 2145) Weight:  [91.672 kg (202 lb 1.6 oz)] 91.672 kg (202 lb 1.6 oz) (01/08 0500)  Intake/Output from previous day: 01/07 0701 - 01/08 0700 In: [id number] [I.V.:4700.2; Blood:1533; IV Piggyback:1400] Out: [id number] [Urine:2725; Blood:1800; Chest Tube:1285] Intake/Output this shift: Total I/O In: 80 [I.V.:80] Out: 170 [Urine:70; Chest Tube:100]   Basename 09/27/12 1753 09/27/12 1725 09/27/12 0415 09/26/12 1948 09/26/12 1945  HGB 8.5* 8.5* 8.9* 9.2* 9.5*    Basename 09/27/12 1753 09/27/12 1725 09/27/12 0415  WBC -- 21.1* 22.6*  RBC -- 3.52* 3.76*  HCT 25.0* 24.5* --  PLT -- 259 257    Basename 09/27/12 1753 09/27/12 1725 09/27/12 0415 09/26/12 0443  NA 136 -- 134* --  K 4.6 -- 4.4 --  CL 104 -- 101 --  CO2 -- -- 24 26  BUN 16 -- 15 --  CREATININE 0.90 0.95 -- --  GLUCOSE 107* -- 110* --  CALCIUM -- -- 8.4 8.5    Basename 09/26/12 1345  LABPT --  INR 1.77*    intact sensation and capillary refill all digits.  wiggles fingers, but stiff.  wound looks good with no erythema or drainage.  non tender at wrist.  no streaks.  swelling resolved.  Assessment/Plan: 1 Day Post-Op Procedure(s) (LRB): MITRAL VALVE (MV) REPLACEMENT (N/A) INTRAOPERATIVE TRANSESOPHAGEAL ECHOCARDIOGRAM (N/A) Okay to switch to daily wet to dry dressings of wound.  Splint for comfort.  ROM digits and wrist.  Fany Cavanaugh R 09/27/2012, 9:44 PM

## 2012-09-27 NOTE — Progress Notes (Signed)
POD # 1 MVR for endocarditis  Up in chair  BP 177/71  Pulse 102  Temp 98 F (36.7 C) (Oral)  Resp 38  Ht 6' (1.829 m)  Wt 202 lb 1.6 oz (91.672 kg)  BMI 27.41 kg/m2  SpO2 99%   Intake/Output Summary (Last 24 hours) at 09/27/12 1836 Last data filed at 09/27/12 1800  Gross per 24 hour  Intake 3420.68 ml  Output   2430 ml  Net 990.68 ml    Stable day

## 2012-09-27 NOTE — Progress Notes (Signed)
FMTS Attending Note Patient case reviewed and discussed with resident team, and I agree with Dr Alen Bleacher assessment and plan.  Dalbert Mayotte, MD

## 2012-09-27 NOTE — Progress Notes (Signed)
Peripherally Inserted Central Catheter/Midline Placement  The IV Nurse has discussed with the patient and/or persons authorized to consent for the patient, the purpose of this procedure and the potential benefits and risks involved with this procedure.  The benefits include less needle sticks, lab draws from the catheter and patient may be discharged home with the catheter.  Risks include, but not limited to, infection, bleeding, blood clot (thrombus formation), and puncture of an artery; nerve damage and irregular heat beat.  Alternatives to this procedure were also discussed.  PICC/Midline Placement Documentation  PICC / Midline Single Lumen 09/27/12 PICC Left Brachial (Active)       Hyman, Barreno 09/27/2012, 8:56 PM

## 2012-09-27 NOTE — Progress Notes (Addendum)
1 Day Post-Op Procedure(s) (LRB): MITRAL VALVE (MV) REPLACEMENT (N/A) INTRAOPERATIVE TRANSESOPHAGEAL ECHOCARDIOGRAM (N/A) Subjective:  Complains of pain  Objective: Vital signs in last 24 hours: Temp:  [95.5 F (35.3 C)-99 F (37.2 C)] 98.4 F (36.9 C) (01/08 0700) Pulse Rate:  [90-101] 99  (01/08 0700) Cardiac Rhythm:  [-] Normal sinus rhythm (01/08 0400) Resp:  [0-34] 20  (01/08 0700) BP: (108-130)/(16-67) 114/67 mmHg (01/08 0700) SpO2:  [97 %-100 %] 100 % (01/08 0700) FiO2 (%):  [40 %-50 %] 40 % (01/07 2145) Weight:  [91.672 kg (202 lb 1.6 oz)] 91.672 kg (202 lb 1.6 oz) (01/08 0500)  Hemodynamic parameters for last 24 hours: PAP: (33-41)/(18-24) 34/21 mmHg CO:  [2.5 L/min-4.6 L/min] 4.4 L/min CI:  [1.2 L/min/m2-2.1 L/min/m2] 2.1 L/min/m2  Intake/Output from previous day: 01/07 0701 - 01/08 0700 In: [id number] [I.V.:4700.2; Blood:1533; IV Piggyback:1400] Out: [id number] [Urine:2725; Blood:1800; Chest Tube:1285] Intake/Output this shift:    General appearance: slowed mentation Neurologic: intact Heart: regular rate and rhythm, S1, S2 normal, no murmur, click, rub or gallop  Lungs: clear to auscultation bilaterally Abdomen: soft, non-tender; bowel sounds normal; no masses,  no organomegaly Extremities: extremities normal, atraumatic, no cyanosis or edema  Wound: dressings dry  Lab Results:  Basename 09/27/12 0415 09/26/12 1948 09/26/12 1945  WBC 22.6* -- 24.7*  HGB 8.9* 9.2* --  HCT 25.9* 27.0* --  PLT 257 -- 284   BMET:  Basename 09/27/12 0415 09/26/12 1948 09/26/12 0443  NA 134* 137 --  K 4.4 4.5 --  CL 101 104 --  CO2 24 -- 26  GLUCOSE 110* 119* --  BUN 15 15 --  CREATININE 0.90 1.00 --  CALCIUM 8.4 -- 8.5    PT/INR:  Basename 09/26/12 1345  LABPROT 20.0*  INR 1.77*   ABG    Component Value Date/Time   PHART 7.411 09/27/2012 0256   HCO3 24.7* 09/27/2012 0256   TCO2 26 09/27/2012 0256   ACIDBASEDEF 1.1 09/13/2012 0411   O2SAT 97.0 09/27/2012 0256   CBG (last  3)   Basename 09/27/12 0744 09/27/12 0644 09/27/12 0547  GLUCAP 125* 118* 106*   CXR: mild left base atelectasis.   GS of valve vegetation showed few wbc but no organisms.  Assessment/Plan: S/P Procedure(s) (LRB): MITRAL VALVE (MV) REPLACEMENT (N/A) INTRAOPERATIVE TRANSESOPHAGEAL ECHOCARDIOGRAM (N/A) Hemodynamically sable:  Wean dopamine and neo Hold B-Blocker until of vasopressors Continue amiodarone that he was on for A-fib preop. High risk for postop A-fib. Mobilize Diabetes control See progression orders Will keep chest tubes in today since he had severe pericarditis. Continue Ancef for MSSA endocarditis.    LOS: 15 days    Ryan [name] K 09/27/2012

## 2012-09-27 NOTE — Progress Notes (Signed)
INFECTIOUS DISEASE PROGRESS NOTE  ID: Ryan [name] is a 67 y.o. 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  Hypokalemia  Subjective: Awake, does not interact  Abtx:  Anti-infectives     Start     Dose/Rate Route Frequency Ordered Stop   09/26/12 2200   cefUROXime (ZINACEF) 1.5 g in dextrose 5 % 50 mL IVPB  Status:  Discontinued        1.5 g 100 mL/hr over 30 Minutes Intravenous Every 12 hours 09/26/12 1331 09/27/12 0830   09/26/12 2000   vancomycin (VANCOCIN) IVPB 1000 mg/200 mL premix        1,000 mg 200 mL/hr over 60 Minutes Intravenous  Once 09/26/12 1331 09/26/12 2053   09/26/12 0600   cefUROXime (ZINACEF) 750 mg in dextrose 5 % 50 mL IVPB  Status:  Discontinued        750 mg 100 mL/hr over 30 Minutes Intravenous  Once 09/25/12 1649 09/26/12 0517   09/26/12 0400   vancomycin (VANCOCIN) 1,500 mg in sodium chloride 0.9 % 250 mL IVPB        1,500 mg 125 mL/hr over 120 Minutes Intravenous To Surgery 09/25/12 1512 09/26/12 0800   09/26/12 0400   cefUROXime (ZINACEF) 1.5 g in dextrose 5 % 50 mL IVPB        1.5 g 100 mL/hr over 30 Minutes Intravenous To Surgery 09/25/12 1512 09/26/12 1211   09/26/12 0400   cefUROXime (ZINACEF) 750 mg in dextrose 5 % 50 mL IVPB  Status:  Discontinued        750 mg 100 mL/hr over 30 Minutes Intravenous To Surgery 09/25/12 1727 09/26/12 1222   09/16/12 0800   fluconazole (DIFLUCAN) tablet 150 mg        150 mg Oral  Once 09/16/12 0619 09/16/12 1204   09/15/12 1400   ceFAZolin (ANCEF) IVPB 2 g/50 mL premix        2 g 100 mL/hr  over 30 Minutes Intravenous 3 times per day 09/15/12 1110     09/13/12 1200   vancomycin (VANCOCIN) 750 mg in sodium chloride 0.9 % 150 mL IVPB  Status:  Discontinued        750 mg 150 mL/hr over 60 Minutes Intravenous Every 12 hours 09/13/12 0409 09/15/12 1110   09/13/12 0800   piperacillin-tazobactam (ZOSYN) IVPB 3.375 g  Status:  Discontinued        3.375 g 12.5 mL/hr over 240 Minutes Intravenous Every 8 hours 09/13/12 0409 09/15/12 1110   09/12/12 2300   vancomycin (VANCOCIN) IVPB 1000 mg/200 mL premix        1,000 mg 200 mL/hr over 60 Minutes Intravenous  Once 09/12/12 2253 09/13/12 0128   09/12/12 2300  piperacillin-tazobactam (ZOSYN) IVPB 3.375 g       3.375 g 12.5 mL/hr over 240 Minutes Intravenous  Once 09/12/12 2253 09/13/12 0023          Medications:  Scheduled:   . acetaminophen  1,000 mg Oral Q6H   Or  . acetaminophen (TYLENOL) oral liquid 160 mg/5 mL  975 mg Per Tube Q6H  . amiodarone  200 mg Oral  BID  . aspirin EC  325 mg Oral Daily   Or  . aspirin  324 mg Per Tube Daily  . atorvastatin  40 mg Oral q1800  . bisacodyl  10 mg Oral Daily   Or  . bisacodyl  10 mg Rectal Daily  .  ceFAZolin (ANCEF) IV  2 g Intravenous Q8H  . colchicine  0.6 mg Oral BID  . docusate sodium  200 mg Oral Daily  . enoxaparin (LOVENOX) injection  40 mg Subcutaneous Q24H  . folic acid  1 mg Oral Daily  . insulin aspart  0-24 Units Subcutaneous Q4H  . insulin regular  0-10 Units Intravenous TID WC  . pantoprazole  40 mg Oral Daily  . sodium chloride  3 mL Intravenous Q12H  . thiamine  100 mg Oral Daily    Objective: Vital signs in last 24 hours: Temp:  [96.1 F (35.6 C)-99 F (37.2 C)] 97.3 F (36.3 C) (01/08 1143) Pulse Rate:  [91-115] 102  (01/08 1330) Resp:  [0-34] 33  (01/08 1330) BP: (103-154)/(16-73) 121/62 mmHg (01/08 1300) SpO2:  [97 %-100 %] 98 % (01/08 1330) FiO2 (%):  [40 %-50 %] 40 % (01/07 2145) Weight:  [91.672 kg (202 lb 1.6 oz)] 91.672 kg (202 lb 1.6 oz)  (01/08 0500)   General appearance: alert and mild distress Resp: clear to auscultation bilaterally Cardio: tachycardia GI: normal findings: bowel sounds normal Extremities: L foot wound is dark, no d/c noted.  Lab Results  Basename 09/27/12 0415 09/26/12 1948 09/26/12 1945 09/26/12 0443  WBC 22.6* -- 24.7* --  HGB 8.9* 9.2* -- --  HCT 25.9* 27.0* -- --  NA 134* 137 -- --  K 4.4 4.5 -- --  CL 101 104 -- --  CO2 24 -- -- 26  BUN 15 15 -- --  CREATININE 0.90 1.00 -- --  GLU -- -- -- --   Liver Panel  Basename 09/25/12 0340  PROT 6.1  ALBUMIN 1.6*  AST 49*  ALT 18  ALKPHOS 161*  BILITOT 0.7  BILIDIR 0.3  IBILI 0.4   Sedimentation Rate  Basename 09/26/12 0443  ESRSEDRATE 61*   C-Reactive Protein No results found for this basename: CRP:2 in the last 72 hours  Microbiology: Recent Results (from the past 240 hour(s))  CULTURE, BLOOD (ROUTINE X 2)     Status: Normal   Collection Time   09/18/12  8:49 PM      Component Value Range Status Comment   Specimen Description BLOOD LEFT FOOT   Final    Special Requests BOTTLES DRAWN AEROBIC ONLY [place]   Final    Culture  Setup Time 09/19/2012 05:09   Final    Culture NO GROWTH 5 DAYS   Final    Report Status 09/25/2012 FINAL   Final   CULTURE, BLOOD (ROUTINE X 2)     Status: Normal   Collection Time   09/18/12  9:15 PM      Component Value Range Status Comment   Specimen Description BLOOD LEFT FOOT   Final    Special Requests BOTTLES DRAWN AEROBIC ONLY [facility]   Final    Culture  Setup Time 09/19/2012 05:09   Final    Culture NO GROWTH 5 DAYS   Final    Report Status 09/25/2012 FINAL   Final   MRSA PCR SCREENING     Status: Normal   Collection Time   09/21/12  2:45 PM      Component Value Range Status  Comment   MRSA by PCR NEGATIVE  NEGATIVE Final   CLOSTRIDIUM DIFFICILE BY PCR     Status: Normal   Collection Time   09/23/12  7:34 PM      Component Value Range Status Comment   C difficile by pcr NEGATIVE  NEGATIVE Final     TISSUE CULTURE     Status: Normal (Preliminary result)   Collection Time   09/26/12 10:22 AM      Component Value Range Status Comment   Specimen Description TISSUE   Final    Special Requests     Final    Value: PORTION OF MITRAL VALVE LEAFLETS PT ON ZINACEF ANCEG AND VANCO   Gram Stain     Final    Value: RARE WBC PRESENT, PREDOMINANTLY PMN     NO ORGANISMS SEEN   Culture NO GROWTH 1 DAY   Final    Report Status PENDING   Incomplete     Studies/Results: Dg Chest Portable 1 View In Am  09/27/2012  *RADIOLOGY REPORT*  Clinical Data: Postop mitral valve replacement.  PORTABLE CHEST - 1 VIEW  Comparison: 09/26/2012.  Findings: Interval extubation.  Nasogastric tube has been removed. Right IJ central line tip projects over the SVC.  Right IJ Swan- Ganz catheter tip projects over the right coronary artery. Bilateral chest tubes, mediastinal drains epicardial pacer wires remain in place. Left PICC tip projects over the SVC.  Sternotomy wires are unchanged in position.  Heart is enlarged, stable.  Lungs are somewhat low in volume with mild bibasilar air space disease, left greater than right. Probable small left pleural effusion.  No definite pneumothorax.  IMPRESSION:  1.  Somewhat low lung volumes with bibasilar air space disease, left greater than right, and probable small left pleural effusion. 2.  No definite pneumothorax.   Original Report Authenticated By: Lorin Picket, M.D.    Dg Chest Portable 1 View  09/26/2012  *RADIOLOGY REPORT*  Clinical Data: 67 year old male - status post cardiac valve replacement.  PORTABLE CHEST - 1 VIEW  Comparison: 09/16/2012 and prior chest radiographs  Findings: Cardiomegaly and evidence of cardiac valve replacement noted. An endotracheal tube with tip 3.5 cm above the carina, a left PICC line with tip overlying the mid SVC, an NG tube entering the stomach, a right IJ Swan-Ganz catheter with tip overlying the right main pulmonary artery, a right IJ central venous  cath with tip overlying the lower SVC, and mediastinal/right thoracostomy tubes. A left pleural effusion is noted as well as bibasilar atelectasis. There is no evidence of pneumothorax.  IMPRESSION: Postoperative changes with support apparatus as described.  Left pleural effusion and bibasilar atelectasis - no evidence of pneumothorax.   Original Report Authenticated By: Margarette Canada, M.D.      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  Watch his mental status, known embolic disease   Bobby Rumpf Infectious Diseases [id number] 09/27/2012, 2:20 PM   LOS: 15 days

## 2012-09-27 NOTE — Progress Notes (Signed)
Left arm PICC line discontinued as ordered. Line intact at 48cm. Manual pressure held and no active bleeding noted. Vaseline gauze pressure dressing applied and secured. RN present and aware.

## 2012-09-27 NOTE — Progress Notes (Signed)
Patient Name: Ryan [name] Date of Encounter: 09/27/2012    SUBJECTIVE: The patient is awake and in pain. He is not short of breath.   TELEMETRY:  Atrial fibrillation with poor rate response: Filed Vitals:   09/27/12 0400 09/27/12 0500 09/27/12 0600 09/27/12 0700  BP: 108/43   114/67  Pulse: 99 99 101 99  Temp: 98.8 F (37.1 C) 98.8 F (37.1 C) 98.6 F (37 C) 98.4 F (36.9 C)  TempSrc:      Resp: 26 34 25 20  Height:      Weight:  91.672 kg (202 lb 1.6 oz)    SpO2: 99% 99% 98% 100%    Intake/Output Summary (Last 24 hours) at 09/27/12 0847 Last data filed at 09/27/12 0700  Gross per 24 hour  Intake 7633.23 ml  Output   5810 ml  Net 1823.23 ml    LABS: Basic Metabolic Panel:  Basename 09/27/12 0415 09/26/12 1948 09/26/12 1945 09/26/12 0443  NA 134* 137 -- --  K 4.4 4.5 -- --  CL 101 104 -- --  CO2 24 -- -- 26  GLUCOSE 110* 119* -- --  BUN 15 15 -- --  CREATININE 0.90 1.00 -- --  CALCIUM 8.4 -- -- 8.5  MG 2.5 -- 3.0* --  PHOS -- -- -- --   CBC:  Basename 09/27/12 0415 09/26/12 1948 09/26/12 1945  WBC 22.6* -- 24.7*  NEUTROABS -- -- --  HGB 8.9* 9.2* --  HCT 25.9* 27.0* --  MCV 68.9* -- 69.9*  PLT 257 -- 284    Radiology/Studies:  Chest x-ray with postop changes  Physical Exam: Blood pressure 114/67, pulse 99, temperature 98.4 F (36.9 C), temperature source Axillary, resp. rate 20, height 6' (1.829 m), weight 91.672 kg (202 lb 1.6 oz), SpO2 100.00%. Weight change:    No significant rub is heard on auscultation. No murmurs heard.  ASSESSMENT:  1. 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 Atrial fibrillation with poor rate control.  3. Diastolic heart failure,moderate   Plan:  1. Continue amiodarone to eventually convert and suppress atrial fibrillation  2. Diuresis  Signed, [name] 09/27/2012, 8:47 AM

## 2012-09-27 NOTE — Op Note (Signed)
NAMEMarland Kitchen  ERRETT, GOLDSCHMIDT NO.:  [id number]  MEDICAL RECORD NO.:  [id number]  LOCATION:  2302                         FACILITY:  [facility]  PHYSICIAN:  Gilford Raid, M.D.     DATE OF BIRTH:  [birth year]  DATE OF PROCEDURE:  09/26/2012 DATE OF DISCHARGE:                              OPERATIVE REPORT   PREOPERATIVE DIAGNOSIS:  Mitral valve endocarditis with moderate mitral regurgitation.  POSTOPERATIVE DIAGNOSIS:  Mitral valve endocarditis with moderate mitral regurgitation.  OPERATIVE PROCEDURES:  Median sternotomy, extracorporeal circulation, mitral valve replacement using a 27-mm Edwards pericardial Magna-Ease valve.  ATTENDING SURGEON:  Gilford Raid, MD  ASSISTANT:  Suzzanne Cloud, PA  ANESTHESIA:  General endotracheal.  CLINICAL HISTORY:  This patient is a 67 year old gentleman with history of atrial fibrillation, peripheral vascular disease, and chronic right foot wound with hardware in place, who presented with mental status changes as well as pain in his neck and right wrist, and difficulty using his right leg.  He was initially diagnosed with pyelonephritis with possible urosepsis.  He subsequently was diagnosed with MSSA bacteremia with right wrist septic arthritis and underwent incision and drainage of the right wrist.  He also was diagnosed with MSSA in his right foot wound and underwent incision and debridement with hardware removal.  He was found to have cervical diskitis or osteomyelitis.  MRI of the brain showed several small acute infarcts in the right hemisphere, concerning for embolic strokes.  A transthoracic echocardiogram showed a small moderate-sized pericardial effusion and possible mitral valve endocarditis.  He underwent a TEE which showed 2.5- cm vegetation on the anterior leaflet mitral valve near the annulus. There was a lucent area within the vegetation, suggesting a possible abscess and there was moderate mitral regurgitation with 2  jets through the area of vegetation, suggesting perforation in the anterior leaflet. There was no evidence of vegetation or involvement of the aortic valve. He was continued on intravenous antibiotics and had followup blood cultures which were negative.  It was felt that mitral valve replacement was needed due to the large size of vegetation which had perforated anterior leaflet.  He continued to have some low-grade fevers.  I felt that a tissue valve will be best for this patient given his age and his underlying medical problems.  I discussed the operative procedure with the patient and his family including alternatives, benefits, and risks including, but not limited to bleeding, blood transfusion, infection, stroke, myocardial infarction, heart block, requiring permanent pacemaker, organ dysfunction, prosthetic valve endocarditis, and death. He understood all this and agreed to proceed.  OPERATIVE PROCEDURE:  The patient was taken to the operating room and placed on the table in supine position.  After induction of general endotracheal anesthesia, a Foley catheter was placed in bladder using sterile technique.  Then, the chest, abdomen, and both lower extremities were prepped and draped in usual sterile manner.  A TEE was performed by Dr. Kate Sable of Anesthesia.  This was reviewed by me.  It was essentially unchanged from his preoperative echocardiogram, showing this large vegetation located anterolaterally on the anterior leaflet mitral valve near the annulus.  There was moderate mitral regurgitation through the area of vegetation,  suggesting leaflet perforation.  The aortic valve had no regurgitation and no sign of vegetation.  Right and left heart function looked good.  There was no tricuspid vegetation.  There was pericardial effusion present.  Then, the chest was opened through a median sternotomy incision and the pericardium opened in the midline.  The patient had severe  subacute pericarditis with a thick fibrinous layer over the visceral and parietal pericardium.  There was some bloody fluid present within the pericardium.  The fibrinous exudate over the heart could be peeled off in layers.  The ascending aorta was of normal size and had no palpable plaques in it.  The patient was heparinized and when an adequate ACT was obtained, the distal ascending aorta was cannulated using a 20-French aortic cannula for arterial inflow.  Venous outflow was achieved using bicaval cannulation with a 28-French metal-tipped right angle cannula, placed through a pursestring suture in the superior vena cava and a 36-French plastic malleable cannula placed through a pursestring suture in the low right atrium.  An antegrade cardioplegia and vent cannula was inserted in aortic root.  I attempted to insert a retrograde cardioplegic cannula, but could not get to engage the ostium of the coronary sinus. After multiple attempts, I could not get the catheter to enter the coronary sinus and therefore decided to use only antegrade cardioplegia.  The patient was placed on cardiopulmonary bypass.  The superior vena cava was mobilized.  The interatrial groove was dissected.  Then, the aorta was crossclamped and 1000 mL of cold blood antegrade cardioplegia was administered in the aortic root with quick arrest of the heart. Systemic hypothermia to 20 degrees centigrade and topical hypothermia with iced saline was used.  A temperature probe was placed in septum, insulating the pad and the pericardium.  We gave an additional doses of cold blood antegrade cardioplegia in about 20 and 30 minutes intervals throughout the case.  Then, the left atrium was opened through a vertical incision in an interatrial groove.  The mitral retractor was placed.  There was good visualization of mitral valve.  This showed that there was a large vegetation on the anterior leaflet mitral valve that  extended onto the posterior mitral valve leaflet near the anterolateral commissure.  The vegetation was limited exclusively to the anterolateral portion of the valve.  It went up to the annulus, but did not involve the anulus. There was some small vegetations on the chordae to both the anterior and posterior leaflets anterolaterally.  Then, the anterior and posterior leaflets were excised along with the vegetation.  I did send a section for culture and Gram-stain.  There did not appear to be any abscess present.  The annulus was intact.  Then, the left atrium was irrigated with iced saline solution.  Care was taken to remove all particulate debris.  Then, a series of pledgeted 2-0 Ethibond horizontal mattress sutures were placed around the mitral annulus with the pledgets in the subannular position.  The annulus was sized and a 27-mm Edwards pericardial tissue valve was chosen.  This was model #[id number], had serial [id number].  Then, the sutures were placed through the sewing ring and the valve lowered in place.  Sutures were tied sequentially.  The valve seated nicely and the leaflets were moving normally.  The patient was then rewarmed to 37 degrees centigrade.  The left atriotomy was closed in 2 layers using continuous 3-0 Prolene suture.  We did use CO2 insufflation into the pericardial cavity throughout the case  to minimize intracardiac air.  Then, the left side of the heart was de-aired.  The head was placed in Trendelenburg position and the crossclamp removed with a time of 118 minutes.  There was spontaneous return of sinus rhythm.  Two temporary right ventricular and right atrial pacing wires were placed and brought through the skin.  While we were finishing rewarming, I peeled as much of the fibrinous exudate off the heart and parietal pericardium as I could to try to prevent future constrictive pericarditis from developing.  After completing this, the patient rewarmed, and weaned  from cardiopulmonary bypass, on dopamine at 3 mcg/kg/minute.  Total bypass time was 159 minutes.  TEE showed normal functioning mitral valve prosthesis with no evidence of perivalvular leak or regurgitation through the valve.  The aortic valve was functioning normally without regurgitation.  Left ventricular function appeared unchanged.  Protamine was then given and the venous and aortic cannulas were removed without difficulty.  The patient was given 1 unit of platelets due to coagulopathy, this resulted in adequate hemostasis.  The visceral and parietal pericardium were diffusely inflamed and oozing blood, but this all stopped after platelets were given.  Then, 3 chest tubes were placed with 2 in the posterior pericardium at the level of the right pleural space and one in the anterior mediastinum.  The sternum was closed with double #6 stainless steel wires.  The fascia was closed with continuous #1 Vicryl suture.  Subcutaneous tissue was closed with continuous 2-0 Vicryl. Skin was closed with 3-0 Vicryl subcuticular skin closure.  The sponge, needle, and instrument counts were correct according to the scrub nurse. Dry sterile dressing was applied over the incision and around the chest tubes which were hooked to Pleur-Evac suction.  The patient remained hemodynamically stable and transported to the SICU in guarded, but stable condition.     Gilford Raid, M.D.     BB/MEDQ  D:  09/26/2012  T:  09/27/2012  Job:  [id number]  cc:   [name], M.D.

## 2012-09-27 NOTE — Progress Notes (Signed)
Notified MD of cbgs low 90s and upper 80s with insulin drip at 92ml/hr per glucostablizier. 20 units of levimere ordered, call MD to make aware of cbgs without insulin and pts decreased po intake. MD DC order and will cont checking cbg q1 hr until 1400 and then will resume q4 sliding scale insulin at 1600

## 2012-09-27 NOTE — Progress Notes (Signed)
PT HYDROTHERAPY PROGRESS NOTE    09/27/12 1000  Subjective Assessment  Subjective Pt seen s/p open heart procedure; pt agreeable to hydrotherapy today  Patient and Family Stated Goals heal wound  Date of Onset 09/15/12  Prior Treatments I&D  Evaluation and Treatment  Evaluation and Treatment Procedures Explained to Patient/Family Yes  Evaluation and Treatment Procedures agreed to  Wound 09/16/12 Other (Comment) Wrist Right;Other (Comment) Right wrist  Date First Assessed/Time First Assessed: 09/16/12 1005   Wound Type: Other (Comment)  Location: Wrist  Location Orientation: Right;Other (Comment)  Wound Description (Comments): Right wrist  Present on Admission: No  Site / Wound Assessment Red;Clean;Other (Comment) (Tendon and exposed fascia)  % Wound base Red or Granulating 50%  % Wound base Other (Comment) 50% (Fascia and tendons.)  Peri-wound Assessment Intact  Margins Unattacted edges (unapproximated)  Closure Sutures ((2 black sutures (one proximal end of wound, one at distal)))  Drainage Amount Scant  Drainage Description Serosanguineous;No odor  Non-staged Wound Description Full thickness  Treatment Hydrotherapy (Pulse lavage)  Dressing Type Gauze (Comment);Compression wrap;Moist to moist;Other (Comment);Impregnated gauze (bismuth) (1/4" plain packing strip NS Moist to Moist, xeroform)  Dressing Changed Changed  Dressing Status Clean;Dry;Intact (includes splint, kerlix, ace wrap, mission sling)  Hydrotherapy  Pulsed Lavage with Suction (psi) 4 psi  Pulsed Lavage with Suction - Normal Saline Used 1000 mL  Pulsed Lavage Tip Tip with splash shield  Pulsed lavage therapy - wound location Rt dorsal wrist  Wound Therapy - Assess/Plan/Recommendations  Wound Therapy - Clinical Statement Wound continues to heal well; margins appear to be closing in width and length.  No necrotic tissue evident.  Will continue hydrotherapy as indicated with recommendation for reassessment and  possible d/c of hydrotherapy.  Wound Therapy - Functional Problem List Increased pain causing decreased use of right UE.  Factors Delaying/Impairing Wound Healing Infection - systemic/local;Immobility  Hydrotherapy Plan Debridement;Dressing change;Patient/family education;Pulsatile lavage with suction  Wound Therapy - Frequency 3X / week  Wound Therapy - Follow Up Recommendations Home health RN  Wound Plan rec d/c hydrotherapy    Alben Deeds, [place] DPT  [phone]

## 2012-09-27 NOTE — Progress Notes (Signed)
Family Medicine Teaching Service Daily Progress Note Service Page: [phone]  Patient Assessment: 67 y.o. year old male with acute encephalopathy (now resolved) secondary to sepsis from multiple sources, s/p I&D of R wrist and R MTP lesion (with HW removal), Multiple small R hemisphere infarcts that are likely septic emboli, C6-T1 Diskitis, pericarditis,  Endocarditis, and E. Coli UTI (s/p treatment).   Subjective:  Some soreness but managed well with pain meds, no dyspnea, some mild abdominal pain.   Objective: Temp:  [95.5 F (35.3 C)-99 F (37.2 C)] 98.4 F (36.9 C) (01/08 0700) Pulse Rate:  [90-101] 99  (01/08 0700) Resp:  [0-34] 20  (01/08 0700) BP: (108-130)/(16-67) 114/67 mmHg (01/08 0700) SpO2:  [97 %-100 %] 100 % (01/08 0700) FiO2 (%):  [40 %-50 %] 40 % (01/07 2145) Weight:  [202 lb 1.6 oz (91.672 kg)] 202 lb 1.6 oz (91.672 kg) (01/08 0500)  Exam: Gen: NAD, alert, cooperative with exam, lying in bed, tired appearing, R IJ swan catheter, midsternal clean dry intact bandage, an dL chest tube in place.  HEENT: NCAT, EOMI, MMM CV: RRR, no murmur Resp: CTABL, no wheezes, non-labored Abd: Soft, mild tendernes sto palp throughout, BS+ Ext: No edema, warm, Bandage on R wrist and R foot clean dry and intact Neuro: Alert, face symmetric, speaks quietly   I have reviewed the patient's medications, labs, imaging, and diagnostic testing.  Notable results are summarized below.  CBC BMET   Lab 09/27/12 0415 09/26/12 1948 09/26/12 1945 09/26/12 1345  WBC 22.6* -- 24.7* 33.6*  HGB 8.9* 9.2* 9.5* --  HCT 25.9* 27.0* 27.2* --  PLT 257 -- 284 273    Lab 09/27/12 0415 09/26/12 1948 09/26/12 1945 09/26/12 1345 09/26/12 0443 09/25/12 0340  NA 134* 137 -- 136 -- --  K 4.4 4.5 -- 4.1 -- --  CL 101 104 -- -- 102 --  CO2 24 -- -- -- 26 27  BUN 15 15 -- -- 11 --  CREATININE 0.90 1.00 0.94 -- -- --  GLUCOSE 110* 119* -- 131* -- --  CALCIUM 8.4 -- -- -- 8.5 8.3*    Blood Culture 12/26- No  growth- final Blood culture 12/30- No growth- final C. Diff 1/4 - negative  Imaging/Diagnostic Tests: TEE 09/18/2012 - The estimated ejection fraction was in the range of 60% to 65%. - Aortic valve: No evidence of vegetation. - Mitral valve: Large 2.5cm diameter vegetation/ - Left atrium: No evidence of thrombus in the atrial cavity or appendage. - Right atrium: No evidence of thrombus in the atrial cavity or appendage. - Tricuspid valve: No evidence of vegetation. - Pulmonic valve: No evidence of vegetation. - Pericardium, extracardiac: A small, free-flowing pericardial effusion was identified along the right atrial free wall.  MRI Head 12/27 Several small areas of acute infarction involving the right  hemisphere including portions of the; right occipital lobe, right  temporal lobe, right opercular region, posterior right frontal lobe  and right parietal lobe.  MRI Cervical Spine 12/27 C6-7 diskitis with spread of infection to the adjacent facet  joints, spinous process and interspinous ligaments/posterior  paraspinal musculature as discussed above. Epidural abscess  suspected although evaluation limited without contrast.  CXR 09/26/2012 IMPRESSION:  Postoperative changes with support apparatus as described.  Left pleural effusion and bibasilar atelectasis - no evidence of  pneumothorax.  CXR 09/27/2012 IMPRESSION:  1. Somewhat low lung volumes with bibasilar air space disease,  left greater than right, and probable small left pleural effusion.  2. No  definite pneumothorax.  Plan: Ryan [name] is a 67 y.o. year old male with acute encephalopathy secondary to sepsis from multiple sources.  Now post-op from mitral valve replacement on 09/26/2012.   CV- Stable on dopamine and phenylephrine drip this am, weaning per CTS - s/p Mitral valve replacement day 1 - Cardiology and CTS on board, greatly appreciate recommendations - Lasix held currently, will restart per cards and CTS -  Heart cath on 09/21/2012, Findings:  - Widely patent coronary arteries, pulmonary HTN with PCWP of 30mmHg  - Post-op pain- PRN morphine an doxycodone   Pericarditis, Afib with RVR - stable, Likely due to endocarditis - causing Afib, currently poorly rate controlled with amiodarone - continue per cardiology - TEE on 12/30 confirmed MV vegetation listed below and slight improvement of pericarditis  Acute Diastolic Heart failure - will likely improve s/p MVR - secondary to mitral regurgitation caused by large vegetation, - Lasix held- restart per cards/CTS - Strict I/O, daily weights - POsitive 2200 mL post-op  Resp-  - Extubated, stable on 2L via [place] - no complaints of dyspnea currently,  - CXR with Small L pleural effusion, chest tube placed by CTS this am   ID - metastatic infection - ID on Board-   - Continue Ancef, likely will need 6-8 weeks  - Also currently on schedule cefuroxime - WBC 22.6, afebrile - Blood culture from 12/30 during fever with no growth  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  - now removed s/p MVR  MSSA Bacteremia - Blood Cx 12/23 shows pan sensitive MSSA, Repeat from 12/26 NGTD, Repeated on 09/18/2012- NGTD - R wrist   - OR by Hand surgery on 12/26 for debridement- Cx with mod MSSA  - Hand surgery signed off - R MTP-   - Debridment and hardware removal on 12/26   - Ortho following - appreciate reccs   - Q 3 days R foot dressings- ortho to remove sutures in areound 1/13, believe it will likely not heal unless circulation addressed.  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  - R ABI 0.48 on 1/2/214, L ABI WNL - No heparin currently due to post op and cerebral emboli - Vascular plans to perform LE angio after clearing infection with [place] MD  Acute Kidney Injury.  - Resolved, follow daily BMET  Elevated Transaminases -  - Pt AST:ALT 2:1 consistent with alcoholic picture. EtOH on admission < 11  - Recheck LFTs tomorrow   Newly Dx DM II - A1C 6.5 on check  - Insulin drip currently, wean per CTS - CBG 106-125 - Consider sensitive SSI if starts to develop hyperglycemia, Blood glucose fine for now.   FEN/GI:  - Carb modified diet  - KVO NS - Strict I/O  Prophylaxis: SCD Disposition: Step down for now, CIR vs SNF with PT after workup.   Kenn File, MD 09/27/2012, 7:11 AM

## 2012-09-27 NOTE — Clinical Social Work Note (Signed)
This CSW will update 2300 unit CSW, who will continue to follow for discharge planning needs.   Leandro Reasoner MSW, [place]

## 2012-09-27 NOTE — Evaluation (Addendum)
Physical Therapy Evaluation Patient Details Name: Ryan [name] MRN: [id number] DOB: January 05, 1946 Today's Date: 09/27/2012 Time: [id number] PT Time Calculation (min): 26 min  PT Assessment / Plan / Recommendation Clinical Impression   Pt admitted on 12/24 with multiple new CVAs, cervical stenosis, infection of right wrist and right foot. 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 Pt will benefit from acute PT services to increase independence and safety with  functional mobility. Recommending CIR to further progress rehab before pt returns home.      PT Assessment       Follow Up Recommendations  CIR    Does the patient have the potential to tolerate intense rehabilitation      Barriers to Discharge        Equipment Recommendations  Rolling walker with 5" wheels    Recommendations for Other Services Rehab consult   Frequency Min 3X/week    Precautions / Restrictions Precautions Precautions: Fall;Sternal Restrictions Weight Bearing Restrictions: Yes RUE Weight Bearing: Non weight bearing RLE Weight Bearing: Weight bearing as tolerated   Pertinent Vitals/Pain BP 150's/70's, RR up to 30's, HR  100 to 110       Mobility  Bed Mobility Bed Mobility: Supine to Sit;Sitting - Scoot to Edge of Bed Supine to Sit: 1: +2 Total assist Supine to Sit: Patient Percentage: 40% Sitting - Scoot to Edge of Bed: 1: +1 Total assist Details for Bed Mobility Assistance: assisted LE's to EOB, pt did not assist with UE's ; truncal assist to EOB Transfers Transfers: Sit to Stand;Stand to Sit;Stand Pivot Transfers Sit to Stand: 3: Mod assist;2: Max assist;Without upper extremity assist;From bed Sit to Stand: Patient Percentage: 50% Stand to Sit: 3: Mod assist;Without upper extremity assist;To chair/3-in-1 Stand to Sit: Patient Percentage: 50% Stand Pivot Transfers: 3: Mod assist Stand Pivot Transfers: Patient  Percentage: 60% Details for Transfer Assistance: Cueing for use of LUE to push through armrest and to reach for armrest with sitting. Cueing to scoot forward and for anterior weight shift, but pt too anxious to try Ambulation/Gait Ambulation/Gait Assistance: Not tested (comment) Stairs: No Wheelchair Mobility Wheelchair Mobility: No    Shoulder Instructions     Exercises     PT Diagnosis:    PT Problem List:   PT Treatment Interventions:     PT Goals Acute Rehab PT Goals PT Goal Formulation: With patient/family Time For Goal Achievement: 10/03/12 Potential to Achieve Goals: Good Pt will go Supine/Side to Sit: with min assist PT Goal: Supine/Side to Sit - Progress: Goal set today Pt will go Sit to Supine/Side: with min assist PT Goal: Sit to Supine/Side - Progress: Goal set today Pt will go Sit to Stand: with min assist PT Goal: Sit to Stand - Progress: Goal set today Pt will go Stand to Sit: with min assist PT Goal: Stand to Sit - Progress: Goal set today Pt will Transfer Bed to Chair/Chair to Bed: with min assist PT Transfer Goal: Bed to Chair/Chair to Bed - Progress: Goal set today Pt will Ambulate: 16 - 50 feet;with least restrictive assistive device;with mod assist PT Goal: Ambulate - Progress: Goal set today  Visit Information  Last PT Received On: 09/27/12 Assistance Needed: +2    Subjective Data  Subjective: I can't get up...that hurts my chest.   Prior Functioning       Cognition  Overall Cognitive Status: Impaired Area of Impairment: Safety/judgement Arousal/Alertness: Awake/alert Orientation Level: Appears  intact for tasks assessed Behavior During Session: Anxious    Extremity/Trunk Assessment     Balance Balance Balance Assessed: Yes Static Sitting Balance Static Sitting - Balance Support: Feet supported Static Sitting - Level of Assistance: 5: Stand by assistance Static Sitting - Comment/# of Minutes: 4 min while prepping for transfer  End of  Session PT - End of Session Activity Tolerance: Other (comment);Patient tolerated treatment well (very anxious) Patient left: in chair;with call bell/phone within reach Nurse Communication: Mobility status  GP     Stran Raper, Tessie Fass 09/27/2012, 3:57 PM  09/27/2012  Donnella Sham, PT [phone] (pager)

## 2012-09-28 ENCOUNTER — Inpatient Hospital Stay (HOSPITAL_COMMUNITY): Payer: Medicare PPO

## 2012-09-28 DIAGNOSIS — E119 Type 2 diabetes mellitus without complications: Secondary | ICD-10-CM

## 2012-09-28 DIAGNOSIS — R609 Edema, unspecified: Secondary | ICD-10-CM

## 2012-09-28 LAB — BASIC METABOLIC PANEL
BUN: 22 mg/dL (ref 6–23)
CO2: 24 mEq/L (ref 19–32)
Calcium: 8.2 mg/dL — ABNORMAL LOW (ref 8.4–10.5)
Chloride: 101 mEq/L (ref 96–112)
Creatinine, Ser: 1.11 mg/dL (ref 0.50–1.35)
GFR calc Af Amer: 77 mL/min — ABNORMAL LOW (ref >90)
GFR calc non Af Amer: 66 mL/min — ABNORMAL LOW (ref >90)
Glucose, Bld: 135 mg/dL — ABNORMAL HIGH (ref 70–99)
Potassium: 4.9 mEq/L (ref 3.5–5.1)
Sodium: 133 mEq/L — ABNORMAL LOW (ref 135–145)

## 2012-09-28 LAB — CBC
HCT: 23.1 % — ABNORMAL LOW (ref 39.0–52.0)
Hemoglobin: 7.9 g/dL — ABNORMAL LOW (ref 13.0–17.0)
MCH: 23.9 pg — ABNORMAL LOW (ref 26.0–34.0)
MCHC: 34.2 g/dL (ref 30.0–36.0)
MCV: 69.8 fL — ABNORMAL LOW (ref 78.0–100.0)
Platelets: 288 10*3/uL (ref 150–400)
RBC: 3.31 MIL/uL — ABNORMAL LOW (ref 4.22–5.81)
RDW: 19.6 % — ABNORMAL HIGH (ref 11.5–15.5)
WBC: 19.8 10*3/uL — ABNORMAL HIGH (ref 4.0–10.5)

## 2012-09-28 LAB — HEPARIN LEVEL (UNFRACTIONATED): Heparin Unfractionated: 0.89 IU/mL — ABNORMAL HIGH (ref 0.30–0.70)

## 2012-09-28 LAB — GLUCOSE, CAPILLARY
Glucose-Capillary: 93 mg/dL (ref 70–99)
Glucose-Capillary: 94 mg/dL (ref 70–99)
Glucose-Capillary: 93 mg/dL (ref 70–99)
Glucose-Capillary: 89 mg/dL (ref 70–99)
Glucose-Capillary: 90 mg/dL (ref 70–99)
Glucose-Capillary: 117 mg/dL — ABNORMAL HIGH (ref 70–99)
Glucose-Capillary: 105 mg/dL — ABNORMAL HIGH (ref 70–99)
Glucose-Capillary: 113 mg/dL — ABNORMAL HIGH (ref 70–99)
Glucose-Capillary: 119 mg/dL — ABNORMAL HIGH (ref 70–99)
Glucose-Capillary: 88 mg/dL (ref 70–99)
Glucose-Capillary: 118 mg/dL — ABNORMAL HIGH (ref 70–99)

## 2012-09-28 MED ORDER — BOOST / RESOURCE BREEZE PO LIQD
1.0000 | Freq: Three times a day (TID) | ORAL | Status: DC
  Administered 2012-09-28 – 2012-10-02 (×12): 1 via ORAL

## 2012-09-28 MED ORDER — LIVING WELL WITH DIABETES BOOK
Freq: Once | Status: AC
  Administered 2012-09-28: 10:00:00
  Filled 2012-09-28: qty 1

## 2012-09-28 MED ORDER — METOPROLOL TARTRATE 25 MG PO TABS
25.0000 mg | ORAL_TABLET | Freq: Two times a day (BID) | ORAL | Status: DC
  Administered 2012-09-28: 25 mg via ORAL
  Filled 2012-09-28 (×2): qty 1

## 2012-09-28 MED ORDER — FUROSEMIDE 10 MG/ML IJ SOLN
40.0000 mg | Freq: Two times a day (BID) | INTRAMUSCULAR | Status: DC
  Administered 2012-09-28 – 2012-09-29 (×3): 40 mg via INTRAVENOUS
  Filled 2012-09-28 (×5): qty 4

## 2012-09-28 MED ORDER — AMLODIPINE BESYLATE 2.5 MG PO TABS
2.5000 mg | ORAL_TABLET | Freq: Every day | ORAL | Status: DC
  Administered 2012-09-28 – 2012-10-06 (×9): 2.5 mg via ORAL
  Filled 2012-09-28 (×9): qty 1

## 2012-09-28 MED ORDER — HEPARIN (PORCINE) IN NACL 100-0.45 UNIT/ML-% IJ SOLN
2000.0000 [IU]/h | INTRAMUSCULAR | Status: DC
  Administered 2012-09-28: 2000 [IU]/h via INTRAVENOUS
  Filled 2012-09-28 (×2): qty 250

## 2012-09-28 MED ORDER — HEPARIN (PORCINE) IN NACL 100-0.45 UNIT/ML-% IJ SOLN
1800.0000 [IU]/h | INTRAMUSCULAR | Status: DC
  Administered 2012-09-29: 1800 [IU]/h via INTRAVENOUS
  Filled 2012-09-28 (×2): qty 250

## 2012-09-28 MED FILL — Sodium Bicarbonate IV Soln 8.4%: INTRAVENOUS | Qty: 50 | Status: AC

## 2012-09-28 MED FILL — Heparin Sodium (Porcine) Inj 1000 Unit/ML: INTRAMUSCULAR | Qty: 20 | Status: AC

## 2012-09-28 MED FILL — Lidocaine HCl IV Inj 20 MG/ML: INTRAVENOUS | Qty: 5 | Status: AC

## 2012-09-28 MED FILL — Sodium Chloride IV Soln 0.9%: INTRAVENOUS | Qty: 1000 | Status: AC

## 2012-09-28 MED FILL — Sodium Chloride Irrigation Soln 0.9%: Qty: 3000 | Status: AC

## 2012-09-28 MED FILL — Mannitol IV Soln 20%: INTRAVENOUS | Qty: 500 | Status: AC

## 2012-09-28 MED FILL — Heparin Sodium (Porcine) Inj 1000 Unit/ML: INTRAMUSCULAR | Qty: 30 | Status: AC

## 2012-09-28 MED FILL — Electrolyte-R (PH 7.4) Solution: INTRAVENOUS | Qty: 4000 | Status: AC

## 2012-09-28 NOTE — Progress Notes (Addendum)
Having his chest /mediastinal tube removed. Denies dyspnea.  VS: abnormal with elevated BP. Afebrile. Appears to be in a junctional rhythm currently Pericardial rub still present.  Impression: 1. 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 Atrial fibrillation, on amiodarone 4. Hypertension  Plan:  1. Continue Amio 2. Add low dose amlodipine for BP control.

## 2012-09-28 NOTE — Progress Notes (Signed)
ANTICOAGULATION CONSULT NOTE - Initial Consult  Pharmacy Consult for UFH Indication: b/l DVT  No Known Allergies  Patient Measurements: Height: 6' (182.9 cm) Weight: 202 lb 9.6 oz (91.9 kg) IBW/kg (Calculated) : 77.6  Heparin Dosing Weight: 92kg  Vital Signs: Temp: 97.6 F (36.4 C) (01/09 1129) Temp src: Oral (01/09 1129) BP: 172/60 mmHg (01/09 0900) Pulse Rate: 95  (01/09 0900)  Labs:  Basename 09/28/12 0356 09/27/12 1753 09/27/12 1725 09/27/12 0415 09/26/12 1345  HGB 7.9* 8.5* -- -- --  HCT 23.1* 25.0* 24.5* -- --  PLT 288 -- 259 257 --  APTT -- -- -- -- 40*  LABPROT -- -- -- -- 20.0*  INR -- -- -- -- 1.77*  HEPARINUNFRC -- -- -- -- --  CREATININE 1.11 0.90 0.95 -- --  CKTOTAL -- -- -- -- --  CKMB -- -- -- -- --  TROPONINI -- -- -- -- --    Estimated Creatinine Clearance: 69.9 ml/min (by C-G formula based on Cr of 1.11).   Medical History: Past Medical History  Diagnosis Date  . Hypertension     Medications:  Scheduled:    . acetaminophen  1,000 mg Oral Q6H   Or  . acetaminophen (TYLENOL) oral liquid 160 mg/5 mL  975 mg Per Tube Q6H  . amiodarone  200 mg Oral BID  . amLODipine  2.5 mg Oral Daily  . atorvastatin  40 mg Oral q1800  . bisacodyl  10 mg Oral Daily   Or  . bisacodyl  10 mg Rectal Daily  .  ceFAZolin (ANCEF) IV  2 g Intravenous Q8H  . colchicine  0.6 mg Oral BID  . docusate sodium  200 mg Oral Daily  . feeding supplement  1 Container Oral TID WC  . folic acid  1 mg Oral Daily  . furosemide  40 mg Intravenous BID  . insulin aspart  0-24 Units Subcutaneous Q4H  . insulin regular  0-10 Units Intravenous TID WC  . living well with diabetes book   Does not apply Once  . metoprolol tartrate  25 mg Oral BID  . pantoprazole  40 mg Oral Daily  . sodium chloride  3 mL Intravenous Q12H  . thiamine  100 mg Oral Daily  . [DISCONTINUED] aspirin  324 mg Per Tube Daily  . [DISCONTINUED] aspirin EC  325 mg Oral Daily  . [DISCONTINUED] enoxaparin  (LOVENOX) injection  40 mg Subcutaneous Q24H  . [DISCONTINUED] metoprolol tartrate  12.5 mg Oral BID   Infusions:    . sodium chloride Stopped (09/26/12 1345)  . [DISCONTINUED] sodium chloride Stopped (09/27/12 1000)  . [DISCONTINUED] sodium chloride    . [DISCONTINUED] DOPamine Stopped (09/27/12 1500)  . [DISCONTINUED] insulin (NOVOLIN-R) infusion Stopped (09/27/12 1100)  . [DISCONTINUED] lactated ringers 40 mL/hr at 09/28/12 0400  . [DISCONTINUED] nitroGLYCERIN Stopped (09/26/12 1345)  . [DISCONTINUED] phenylephrine (NEO-SYNEPHRINE) Adult infusion Stopped (09/27/12 1500)    Assessment: 67 y/o male patient s/p MVR, now with b/l DVT requiring full dose anticoagulation. MD request no bolus, patient previously received heparin at time of admission and known to pharmacy.  Goal of Therapy:  Heparin level 0.3-0.7 units/ml Monitor platelets by anticoagulation protocol: Yes   Plan:  Begin heparin gtt at 2000 units/hr, check 6 hour heparin level with daily cbc and heparin level.  Davonna Belling, PharmD, BCPS Pager [phone] 09/28/2012,2:24 PM

## 2012-09-28 NOTE — Progress Notes (Addendum)
2 Days Post-Op Procedure(s) (LRB): MITRAL VALVE (MV) REPLACEMENT (N/A) INTRAOPERATIVE TRANSESOPHAGEAL ECHOCARDIOGRAM (N/A) Subjective:  Patient states he has been better.  He complains of some incisional soreness.  He is alert and oriented, but continues to have slow mentation  Objective: Vital signs in last 24 hours: Temp:  [97.2 F (36.2 C)-98.6 F (37 C)] 97.2 F (36.2 C) (01/09 0424) Pulse Rate:  [52-115] 109  (01/09 0700) Cardiac Rhythm:  [-] Normal sinus rhythm (01/09 0600) Resp:  [17-38] 19  (01/09 0700) BP: (103-179)/(48-78) 179/64 mmHg (01/09 0700) SpO2:  [71 %-100 %] 99 % (01/09 0700) Weight:  [202 lb 9.6 oz (91.9 kg)] 202 lb 9.6 oz (91.9 kg) (01/09 0500)  Hemodynamic parameters for last 24 hours: PAP: (34-39)/(20-25) 34/23 mmHg  Intake/Output from previous day: 01/08 0701 - 01/09 0700 In: 2391.8 [P.O.:1080; I.V.:1161.8; IV Piggyback:150] Out: 1340 [Urine:790; Chest Tube:550] Intake/Output this shift:    General appearance: alert, cooperative and no distress Neurologic: intact and slow mentation Heart: regular rate and rhythm Lungs: clear to auscultation bilaterally Abdomen: soft, non-tender; bowel sounds normal; no masses,  no organomegaly Extremities: edema 1+ Wound: clean and dry  Lab Results:  Basename 09/28/12 0356 09/27/12 1753 09/27/12 1725  WBC 19.8* -- 21.1*  HGB 7.9* 8.5* --  HCT 23.1* 25.0* --  PLT 288 -- 259   BMET:  Basename 09/28/12 0356 09/27/12 1753 09/27/12 0415  NA 133* 136 --  K 4.9 4.6 --  CL 101 104 --  CO2 24 -- 24  GLUCOSE 135* 107* --  BUN 22 16 --  CREATININE 1.11 0.90 --  CALCIUM 8.2* -- 8.4    PT/INR:  Basename 09/26/12 1345  LABPROT 20.0*  INR 1.77*   ABG    Component Value Date/Time   PHART 7.411 09/27/2012 0256   HCO3 24.7* 09/27/2012 0256   TCO2 23 09/27/2012 1753   ACIDBASEDEF 1.1 09/13/2012 0411   O2SAT 97.0 09/27/2012 0256   CBG (last 3)   Basename 09/28/12 0419 09/27/12 2318 09/27/12 2100  GLUCAP 105* 117*  106*    Assessment/Plan: S/P Procedure(s) (LRB): MITRAL VALVE (MV) REPLACEMENT (N/A) INTRAOPERATIVE TRANSESOPHAGEAL ECHOCARDIOGRAM (N/A)  1. CV- NSR, H/O Atrial Fibrillation as outpatient- will continue Amiodarone, patient hypertensive, tachy this morning, will increase Lopressor to 25mg  BID, may need to restart home Diovan if no improvement in pressure 2. Pulm- no acute issues, off oxygen, chest tubes with 570cc output can likely remove today 3. Endocarditis- ID following, continue Ancef 4. Renal- creatinine mildly elevated this morning, good U/O will monitor 5. Volume Overload- patients weight is up, will continue diuresis 6. DM- CBGs controlled 7. Dispo- patient with continued slow mentation, increase Beta Blocker to help with hypertension/tachycardia, ? D/C chest tubes   LOS: 16 days    [name], Ryan [name] 09/28/2012   He is doing well overall. DC all CT's , a-line, neck lines. OOB, PT. Contine high dose Ancef per ID rec. Diurese

## 2012-09-28 NOTE — Progress Notes (Signed)
FMTS Attending Note Patient seen and examined by me, discussed with Dr Venetia Maxon and rest of FMTS team, and I agree with Dr Ebony Hail assessment and plan for today. New finding of bilateral LE DVT noted. Appreciate Dr. Vivi Martens approach to VTE treatment/MVR prophylaxis with heparin and then coumadin.  Six-weeks of Ancef (starting from date of MVR). Dalbert Mayotte, MD

## 2012-09-28 NOTE — Progress Notes (Signed)
OT Cancellation Note  Patient Details Name: Ryan [name] MRN: [id number] DOB: June 01, 1946   Cancelled Treatment:    Reason Eval/Treat Not Completed: Patient at procedure or test/ unavailable (receiving ultrasound).  Will re-attempt later today as time allows.  09/28/2012 Darrol Jump OTR/L Pager [phone] Office [phone]

## 2012-09-28 NOTE — Progress Notes (Signed)
Family Medicine Teaching Service Daily Progress Note Service Page: [phone]  Patient Assessment: 67 y.o. year old male with acute encephalopathy (now resolved) secondary to sepsis from multiple sources, s/p I&D of R wrist and R MTP lesion (with HW removal), Multiple small R hemisphere infarcts that are likely septic emboli, C6-T1 Diskitis, pericarditis,  Endocarditis, and E. Coli UTI (s/p treatment). S/p mitral valve replacement on 1/7.  Subjective:  Pt states he feels okay and is in good spirits; he was anxious/nervous about the surgery but glad "when he woke up on this side." Some incisional soreness but breathing okay. Mild abdominal pain. Curious to know where he will be going after discharge, and when that will be.  Objective: Temp:  [97.2 F (36.2 C)-98.6 F (37 C)] 98.3 F (36.8 C) (01/09 0743) Pulse Rate:  [52-115] 109  (01/09 0700) Resp:  [17-38] 19  (01/09 0700) BP: (103-179)/(48-78) 179/64 mmHg (01/09 0700) SpO2:  [71 %-100 %] 99 % (01/09 0700) Weight:  [202 lb 9.6 oz (91.9 kg)] 202 lb 9.6 oz (91.9 kg) (01/09 0500)  Exam: Gen: NAD, alert, cooperative with exam, lying in bed, tired appearing; R IJ swan catheter and L chest tube in place  HEENT: NCAT, EOMI, MMM CV: RRR, no murmur appreciated Resp: CTABL, no wheezes, non-labored Abd: Soft, diffuse mild tenderness, BS+ Ext: No edema, warm; dressing in place to right wrist/hand, left wrist, and right food, clean/dry/intact Neuro: Alert, oriented/appropriate in conversation, face symmetric;  speaks quietly and some slowed mentation  I have reviewed the patient's medications, labs, imaging, and diagnostic testing.  Notable results are summarized below.  CBC BMET   Lab 09/28/12 0356 09/27/12 1753 09/27/12 1725 09/27/12 0415  WBC 19.8* -- 21.1* 22.6*  HGB 7.9* 8.5* 8.5* --  HCT 23.1* 25.0* 24.5* --  PLT 288 -- 259 257    Lab 09/28/12 0356 09/27/12 1753 09/27/12 1725 09/27/12 0415 09/26/12 0443  NA 133* 136 -- 134* --  K 4.9  4.6 -- 4.4 --  CL 101 104 -- 101 --  CO2 24 -- -- 24 26  BUN 22 16 -- 15 --  CREATININE 1.11 0.90 0.95 -- --  GLUCOSE 135* 107* -- 110* --  CALCIUM 8.2* -- -- 8.4 8.5    Blood Culture 12/26- No growth- final Blood culture 12/30- No growth- final C. Diff 1/4 - negative  Imaging/Diagnostic Tests: TEE 09/18/2012 - The estimated ejection fraction was in the range of 60% to 65%. - Aortic valve: No evidence of vegetation. - Mitral valve: Large 2.5cm diameter vegetation/ - Left atrium: No evidence of thrombus in the atrial cavity or appendage. - Right atrium: No evidence of thrombus in the atrial cavity or appendage. - Tricuspid valve: No evidence of vegetation. - Pulmonic valve: No evidence of vegetation. - Pericardium, extracardiac: A small, free-flowing pericardial effusion was identified along the right atrial free wall.  MRI Head 12/27 Several small areas of acute infarction involving the right  hemisphere including portions of the; right occipital lobe, right  temporal lobe, right opercular region, posterior right frontal lobe  and right parietal lobe.  MRI Cervical Spine 12/27 C6-7 diskitis with spread of infection to the adjacent facet  joints, spinous process and interspinous ligaments/posterior  paraspinal musculature as discussed above. Epidural abscess  suspected although evaluation limited without contrast.  CXR 09/26/2012 IMPRESSION:  Postoperative changes with support apparatus as described.  Left pleural effusion and bibasilar atelectasis - no evidence of  pneumothorax.  CXR 09/27/2012 IMPRESSION:  1. Somewhat low  lung volumes with bibasilar air space disease,  left greater than right, and probable small left pleural effusion.  2. No definite pneumothorax.  Plan: Ryan [name] is a 67 y.o. year old male with acute encephalopathy secondary to sepsis from multiple sources.  Now post-op from mitral valve replacement on 09/26/2012.   CV- Stable off pressors - s/p  Mitral valve replacement day 2 - Cardiology and CTS on board, greatly appreciate recommendations - some tachycardia/hypertension 1/9, continuing amiodarone and increased Lopressor; considering Diovan - Lasix restart per cards/CTS, 40 IV BID; mild increase of Cr 1/8 -> 1/9 (0.9 -> 1.1) - Heart cath on 09/21/2012, Findings:  - Widely patent coronary arteries, pulmonary HTN with PCWP of 77mmHg  - Post-op pain- PRN morphine an doxycodone   Pericarditis, Afib with RVR - stable, Likely due to endocarditis - causing Afib, currently poorly rate controlled with amiodarone - continue per cardiology - TEE on 12/30 confirmed MV vegetation listed below and slight improvement of pericarditis  Acute Diastolic Heart failure - will likely improve s/p MVR (done 1/7) - secondary to mitral regurgitation caused by large vegetation, - Lasix restarted per cards/CTS, as above - Strict I/O, daily weights - POsitive 2200 mL post-op  Resp-  - Extubated, stable on 2L via [place] - no complaints of dyspnea currently  - CXR with Small L pleural effusion, chest tube placed by CTS 1/8; likely out today  ID - metastatic infection - ID on Board-   - Continue Ancef, likely will need 6-8 weeks  - per Dr. 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  - now removed s/p MVR - abx, as above  MSSA Bacteremia - Blood Cx 12/23 shows pan sensitive MSSA, Repeat from 12/26 NGTD, Repeated on 09/18/2012- NGTD - R wrist   - OR by Hand surgery on 12/26 for debridement- Cx with mod MSSA  - Hand surgery signed off - R MTP-   - Debridment and hardware removal on 12/26   - Ortho following - appreciate reccs   - Q 3 days R foot dressings- ortho to remove sutures in  areound 1/13, believe it will likely not heal unless circulation addressed.  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  - R ABI 0.48 on 1/2/214, L ABI WNL - No heparin currently due to post op and cerebral emboli - Vascular plans to perform LE angio after clearing infection with [place] MD  Acute Kidney Injury.  - Resolved, follow daily BMET - mild increase of Cr 1/8 - 1/9, will monitor daily  Elevated Transaminases -  - Pt AST:ALT 2:1 consistent with alcoholic picture. EtOH on admission < 11  - consider recheck LFT's  Newly Dx DM II - A1C 6.5 on check  - Insulin drip 1/8, transitioned to subq insulin - CBG's in the  low 100's - Consider sensitive SSI if starts to develop hyperglycemia, Blood glucose fine for now.   FEN/GI:  - Carb modified diet  - KVO NS - Strict I/O  Prophylaxis: SCD Disposition: Step down for now, CIR vs SNF with PT after workup.   Edison Nicholson, Harrell Gave, MD 09/28/2012, 8:16 AM

## 2012-09-28 NOTE — Progress Notes (Signed)
ANTICOAGULATION CONSULT NOTE - Follow Up Consult  Pharmacy Consult for Heparin Indication: b/l DVT  No Known Allergies  Patient Measurements: Height: 6' (182.9 cm) Weight: 202 lb 9.6 oz (91.9 kg) IBW/kg (Calculated) : 77.6  Heparin Dosing Weight: 92kg  Vital Signs: Temp: 97.6 F (36.4 C) (01/09 1934) Temp src: Oral (01/09 1934) BP: 109/47 mmHg (01/09 2200) Pulse Rate: 75  (01/09 2200)  Labs:  Basename 09/28/12 2142 09/28/12 0356 09/27/12 1753 09/27/12 1725 09/27/12 0415 09/26/12 1345  HGB -- 7.9* 8.5* -- -- --  HCT -- 23.1* 25.0* 24.5* -- --  PLT -- 288 -- 259 257 --  APTT -- -- -- -- -- 40*  LABPROT -- -- -- -- -- 20.0*  INR -- -- -- -- -- 1.77*  HEPARINUNFRC 0.89* -- -- -- -- --  CREATININE -- 1.11 0.90 0.95 -- --  CKTOTAL -- -- -- -- -- --  CKMB -- -- -- -- -- --  TROPONINI -- -- -- -- -- --    Estimated Creatinine Clearance: 69.9 ml/min (by C-G formula based on Cr of 1.11).   Medications:  Heparin @ 2000 units/hr  Assessment: 66yom started on heparin earlier today for bilateral DVTs. Initial heparin level is supratherapeutic. Drawn correctly. No bleeding reported.  Goal of Therapy:  Heparin level 0.3-0.7 units/ml Monitor platelets by anticoagulation protocol: Yes   Plan:  1) Decrease heparin to 1800 units/hr 2) Check 6 hour heparin level  Deboraha Sprang 09/28/2012,10:41 PM

## 2012-09-28 NOTE — Progress Notes (Signed)
NUTRITION CONSULT/FOLLOW UP  Intervention:    Resource Breeze supplement 3 times daily with meals (250 kcals, 9 gm protein per 8 fl oz carton) RD to follow for nutrition care plan  Nutrition Dx:   Increased nutrient needs related to wound healing as evidenced by estimated nutrition needs, ongoing  Goal:   Oral intake with meals & supplements to meet >/= 90% of estimated nutrition needs, unmet  Monitor:   PO & supplemental intake, education appropriateness, weight, labs, I/O's  Assessment:   RD consulted for DM diet education ---> patient not appropriate at this time; in a lot of pain.  Patient s/p procedure 1/7: MITRAL VALVE REPLACEMENT   Reports a poor appetite; mild abdominal pain.  No% meal intake records available ---> RD to order supplements.  Height: Ht Readings from Last 1 Encounters:  09/21/12 6' (1.829 m)    Weight Status:   Wt Readings from Last 1 Encounters:  09/28/12 202 lb 9.6 oz (91.9 kg)    Re-estimated needs:  Kcal: 2300-2500 Protein: 130-140 gm Fluid: 2.3-2.5 L  Skin: non-healing wound to R foot, incisions to R arm, R foot & chest  Diet Order: Clear Liquid   Intake/Output Summary (Last 24 hours) at 09/28/12 0900 Last data filed at 09/28/12 0800  Gross per 24 hour  Intake 2194.29 ml  Output   1300 ml  Net 894.29 ml    Labs:   Lab 09/28/12 0356 09/27/12 1753 09/27/12 1725 09/27/12 0415 09/26/12 1945 09/26/12 0443  NA 133* 136 -- 134* -- --  K 4.9 4.6 -- 4.4 -- --  CL 101 104 -- 101 -- --  CO2 24 -- -- 24 -- 26  BUN 22 16 -- 15 -- --  CREATININE 1.11 0.90 0.95 -- -- --  CALCIUM 8.2* -- -- 8.4 -- 8.5  MG -- -- 2.4 2.5 3.0* --  PHOS -- -- -- -- -- --  GLUCOSE 135* 107* -- 110* -- --    CBG (last 3)   Basename 09/28/12 0745 09/28/12 0419 09/27/12 2318  GLUCAP 113* 105* 117*    Scheduled Meds:   . acetaminophen  1,000 mg Oral Q6H   Or  . acetaminophen (TYLENOL) oral liquid 160 mg/5 mL  975 mg Per Tube Q6H  . amiodarone  200 mg  Oral BID  . amLODipine  2.5 mg Oral Daily  . aspirin EC  325 mg Oral Daily   Or  . aspirin  324 mg Per Tube Daily  . atorvastatin  40 mg Oral q1800  . bisacodyl  10 mg Oral Daily   Or  . bisacodyl  10 mg Rectal Daily  .  ceFAZolin (ANCEF) IV  2 g Intravenous Q8H  . colchicine  0.6 mg Oral BID  . docusate sodium  200 mg Oral Daily  . enoxaparin (LOVENOX) injection  40 mg Subcutaneous Q24H  . folic acid  1 mg Oral Daily  . furosemide  40 mg Intravenous BID  . insulin aspart  0-24 Units Subcutaneous Q4H  . insulin regular  0-10 Units Intravenous TID WC  . metoprolol tartrate  25 mg Oral BID  . pantoprazole  40 mg Oral Daily  . sodium chloride  3 mL Intravenous Q12H  . thiamine  100 mg Oral Daily    Continuous Infusions:   . sodium chloride Stopped (09/26/12 1345)    Arthur Holms, RD, LDN Pager #: [phone] After-Hours Pager #: [phone]

## 2012-09-28 NOTE — Progress Notes (Signed)
Patient ID: Ryan [name], male   DOB: [birth year], 67 y.o.   MRN: [id number]  Patient had lower extremity dopplers ordered by Dr. Johnnye Sima because of lower extremity edema which everyone has right after heart surgery. This does show bilateral lower extremity DVT involving right mid-femoral vein and bilat gastroc veins. He has been at bedrest essentially for a few weeks preop. I think it would be best to treat this with heparin drip, no bolus, and then start coumadin slowly. I was not planning on using coumadin for his tissue mitral valve but with DVT I think it is probably best, although at increased risk in this patient.

## 2012-09-28 NOTE — Progress Notes (Signed)
Patient ID: Ryan [name], male   DOB: 08/28/1946, 67 y.o.   MRN: [id number]  SICU Evening rounds:  Hemodynamically stable  Not much diuresis with lasix 40 mg today.  Started on heparin due to bilat DVT today.

## 2012-09-28 NOTE — Progress Notes (Signed)
INFECTIOUS DISEASE PROGRESS NOTE  ID: Ryan [name] is a 67 y.o. 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 Had 1 loose BM yesterday. Wife concerned about his LE edema.   Abtx:  Anti-infectives     Start     Dose/Rate Route Frequency Ordered Stop   09/26/12 2200   cefUROXime (ZINACEF) 1.5 g in dextrose 5 % 50 mL IVPB  Status:  Discontinued        1.5 g 100 mL/hr over 30 Minutes Intravenous Every 12 hours 09/26/12 1331 09/27/12 0830   09/26/12 2000   vancomycin (VANCOCIN) IVPB 1000 mg/200 mL premix        1,000 mg 200 mL/hr over 60 Minutes Intravenous  Once 09/26/12 1331 09/26/12 2053   09/26/12 0600   cefUROXime (ZINACEF) 750 mg in dextrose 5 % 50 mL IVPB  Status:  Discontinued        750 mg 100 mL/hr over 30 Minutes Intravenous  Once 09/25/12 1649 09/26/12 0517   09/26/12 0400   vancomycin (VANCOCIN) 1,500 mg in sodium chloride 0.9 % 250 mL IVPB        1,500 mg 125 mL/hr over 120 Minutes Intravenous To Surgery 09/25/12 1512 09/26/12 0800   09/26/12 0400   cefUROXime (ZINACEF) 1.5 g in dextrose 5 % 50 mL IVPB        1.5 g 100 mL/hr over 30 Minutes Intravenous To Surgery 09/25/12 1512 09/26/12 1211   09/26/12 0400   cefUROXime (ZINACEF) 750 mg in dextrose 5 % 50 mL IVPB  Status:  Discontinued        750 mg 100 mL/hr over 30 Minutes Intravenous To Surgery 09/25/12 1727 09/26/12 1222   09/16/12 0800   fluconazole (DIFLUCAN) tablet 150 mg        150 mg Oral  Once 09/16/12 0619 09/16/12  1204   09/15/12 1400   ceFAZolin (ANCEF) IVPB 2 g/50 mL premix        2 g 100 mL/hr over 30 Minutes Intravenous 3 times per day 09/15/12 1110     09/13/12 1200   vancomycin (VANCOCIN) 750 mg in sodium chloride 0.9 % 150 mL IVPB  Status:  Discontinued        750 mg 150 mL/hr over 60 Minutes Intravenous Every 12 hours 09/13/12 0409 09/15/12 1110   09/13/12 0800   piperacillin-tazobactam (ZOSYN) IVPB 3.375 g  Status:  Discontinued        3.375 g 12.5 mL/hr over 240 Minutes Intravenous Every 8 hours 09/13/12 0409 09/15/12 1110   09/12/12 2300   vancomycin (VANCOCIN) IVPB 1000 mg/200 mL premix        1,000 mg 200 mL/hr over 60 Minutes Intravenous  Once 09/12/12 2253 09/13/12 0128   09/12/12 2300  piperacillin-tazobactam (ZOSYN) IVPB 3.375 g       3.375 g 12.5 mL/hr over 240 Minutes Intravenous  Once 09/12/12 2253 09/13/12 0023          Medications:  Scheduled:   . acetaminophen  1,000 mg Oral Q6H   Or  . acetaminophen (TYLENOL) oral liquid 160 mg/5  mL  975 mg Per Tube Q6H  . amiodarone  200 mg Oral BID  . amLODipine  2.5 mg Oral Daily  . aspirin EC  325 mg Oral Daily   Or  . aspirin  324 mg Per Tube Daily  . atorvastatin  40 mg Oral q1800  . bisacodyl  10 mg Oral Daily   Or  . bisacodyl  10 mg Rectal Daily  .  ceFAZolin (ANCEF) IV  2 g Intravenous Q8H  . colchicine  0.6 mg Oral BID  . docusate sodium  200 mg Oral Daily  . enoxaparin (LOVENOX) injection  40 mg Subcutaneous Q24H  . folic acid  1 mg Oral Daily  . furosemide  40 mg Intravenous BID  . insulin aspart  0-24 Units Subcutaneous Q4H  . insulin regular  0-10 Units Intravenous TID WC  . living well with diabetes book   Does not apply Once  . metoprolol tartrate  25 mg Oral BID  . pantoprazole  40 mg Oral Daily  . sodium chloride  3 mL Intravenous Q12H  . thiamine  100 mg Oral Daily    Objective: Vital signs in last 24 hours: Temp:  [97.2 F (36.2 C)-98.3 F (36.8 C)] 98.3 F (36.8 C) (01/09 0743) Pulse  Rate:  [52-109] 95  (01/09 0900) Resp:  [17-38] 29  (01/09 0900) BP: (103-179)/(48-78) 172/60 mmHg (01/09 0900) SpO2:  [71 %-100 %] 100 % (01/09 0900) Weight:  [91.9 kg (202 lb 9.6 oz)] 91.9 kg (202 lb 9.6 oz) (01/09 0500)   General appearance: alert, cooperative, fatigued and no distress Resp: clear to auscultation bilaterally Chest wall: midline wound clean Cardio: regular rate and rhythm GI: normal findings: bowel sounds normal and soft, non-tender and abnormal findings:  distended Extremities: edema 2+  Lab Results  Basename 09/28/12 0356 09/27/12 1753 09/27/12 1725 09/27/12 0415  WBC 19.8* -- 21.1* --  HGB 7.9* 8.5* -- --  HCT 23.1* 25.0* -- --  NA 133* 136 -- --  K 4.9 4.6 -- --  CL 101 104 -- --  CO2 24 -- -- 24  BUN 22 16 -- --  CREATININE 1.11 0.90 -- --  GLU -- -- -- --   Liver Panel No results found for this basename: PROT:2,ALBUMIN:2,AST:2,ALT:2,ALKPHOS:2,BILITOT:2,BILIDIR:2,IBILI:2 in the last 72 hours Sedimentation Rate  Basename 09/26/12 0443  ESRSEDRATE 61*   C-Reactive Protein No results found for this basename: CRP:2 in the last 72 hours  Microbiology: Recent Results (from the past 240 hour(s))  CULTURE, BLOOD (ROUTINE X 2)     Status: Normal   Collection Time   09/18/12  8:49 PM      Component Value Range Status Comment   Specimen Description BLOOD LEFT FOOT   Final    Special Requests BOTTLES DRAWN AEROBIC ONLY 1CC   Final    Culture  Setup Time 09/19/2012 05:09   Final    Culture NO GROWTH 5 DAYS   Final    Report Status 09/25/2012 FINAL   Final   CULTURE, BLOOD (ROUTINE X 2)     Status: Normal   Collection Time   09/18/12  9:15 PM      Component Value Range Status Comment   Specimen Description BLOOD LEFT FOOT   Final    Special Requests BOTTLES DRAWN AEROBIC ONLY [facility]   Final    Culture  Setup Time 09/19/2012 05:09   Final    Culture NO GROWTH 5 DAYS   Final    Report  Status 09/25/2012 FINAL   Final   MRSA PCR SCREENING     Status:  Normal   Collection Time   09/21/12  2:45 PM      Component Value Range Status Comment   MRSA by PCR NEGATIVE  NEGATIVE Final   CLOSTRIDIUM DIFFICILE BY PCR     Status: Normal   Collection Time   09/23/12  7:34 PM      Component Value Range Status Comment   C difficile by pcr NEGATIVE  NEGATIVE Final   TISSUE CULTURE     Status: Normal (Preliminary result)   Collection Time   09/26/12 10:22 AM      Component Value Range Status Comment   Specimen Description TISSUE   Final    Special Requests     Final    Value: PORTION OF MITRAL VALVE LEAFLETS PT ON ZINACEF ANCEG AND VANCO   Gram Stain     Final    Value: RARE WBC PRESENT, PREDOMINANTLY PMN     NO ORGANISMS SEEN   Culture NO GROWTH 2 DAYS   Final    Report Status PENDING   Incomplete     Studies/Results: Dg Chest Portable 1 View In Am  09/28/2012  *RADIOLOGY REPORT*  Clinical Data: Postop cardiac surgery, follow-up  PORTABLE CHEST - 1 VIEW  Comparison: Portable chest x-ray of 09/27/2012  Findings: There is little change in aeration with mild basilar volume loss present.  Cardiomegaly is stable.  A left chest tube remains and no definite left pneumothorax is seen.  Left PICC line and two right central venous lines are unchanged in position.  A right chest tube also is present with no pneumothorax.  IMPRESSION:  1.  No change in aeration with mild basilar atelectasis. 2.  Bilateral chest tubes remain with no pneumothorax. 3.  No change in position of central venous lines.   Original Report Authenticated By: Ivar Drape, M.D.    Dg Chest Port 1 View  09/27/2012  *RADIOLOGY REPORT*  Clinical Data: PICC placement.  PORTABLE CHEST - 1 VIEW  Comparison: Portable chest 09/27/2012 at 6:01 a.m.  Findings: The patient has a new left PICC with the tip projecting over the right atrium.  The catheter could be withdrawn 2.5-3 cm for better positioning.  Support tubes lines are otherwise unchanged.  Left basilar atelectasis and small effusion appear improved.   There is cardiomegaly.  No pneumothorax identified.  IMPRESSION:  1.  Tip of left PICC projects in the right atrium.  The catheter could be withdrawn 2.5- 3 cm. 2.  Improved left basilar atelectasis and effusion.   Original Report Authenticated By: Orlean Patten, M.D.    Dg Chest Portable 1 View In Am  09/27/2012  *RADIOLOGY REPORT*  Clinical Data: Postop mitral valve replacement.  PORTABLE CHEST - 1 VIEW  Comparison: 09/26/2012.  Findings: Interval extubation.  Nasogastric tube has been removed. Right IJ central line tip projects over the SVC.  Right IJ Swan- Ganz catheter tip projects over the right coronary artery. Bilateral chest tubes, mediastinal drains epicardial pacer wires remain in place. Left PICC tip projects over the SVC.  Sternotomy wires are unchanged in position.  Heart is enlarged, stable.  Lungs are somewhat low in volume with mild bibasilar air space disease, left greater than right. Probable small left pleural effusion.  No definite pneumothorax.  IMPRESSION:  1.  Somewhat low lung volumes with bibasilar air space disease, left greater than right, and probable small left pleural effusion. 2.  No definite pneumothorax.   Original Report Authenticated By: Lorin Picket, M.D.    Dg Chest Portable 1 View  09/26/2012  *RADIOLOGY REPORT*  Clinical Data: 67 year old male - status post cardiac valve replacement.  PORTABLE CHEST - 1 VIEW  Comparison: 09/16/2012 and prior chest radiographs  Findings: Cardiomegaly and evidence of cardiac valve replacement noted. An endotracheal tube with tip 3.5 cm above the carina, a left PICC line with tip overlying the mid SVC, an NG tube entering the stomach, a right IJ Swan-Ganz catheter with tip overlying the right main pulmonary artery, a right IJ central venous cath with tip overlying the lower SVC, and mediastinal/right thoracostomy tubes. A left pleural effusion is noted as well as bibasilar atelectasis. There is no evidence of pneumothorax.  IMPRESSION:  Postoperative changes with support apparatus as described.  Left pleural effusion and bibasilar atelectasis - no evidence of pneumothorax.   Original Report Authenticated By: Margarette Canada, M.D.      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  Watch his mental status improved today Suggest LE dopplers for his LE edema (suspect that this is related to his fluid balance net 3.3 L +) If he has further loose BM, would suggest C diff.    Bobby Rumpf Infectious Diseases [id number] 09/28/2012, 10:41 AM   LOS: 16 days

## 2012-09-28 NOTE — Progress Notes (Signed)
Inpatient Diabetes Program Recommendations  AACE/ADA: New Consensus Statement on Inpatient Glycemic Control (2013)  Target Ranges:  Prepandial:   less than 140 mg/dL      Peak postprandial:   less than 180 mg/dL (1-2 hours)      Critically ill patients:  140 - 180 mg/dL    Called by RN caring for patient yesterday about this patient.  RN relayed to me that patient was newly diagnosed with diabetes this admission.  A1c 6.5% (09/14/13).  S/P MVR surgery POD#2.  Still having pain at incision site.  Spoke to patient and his wife briefly about his new diagnosis (this diagnosis was made by Resident MD).  Discussed A1C results with him and explained what an A1C is, basic pathophysiology of DM Type 2, basic home care.  RNs to provide ongoing basic DM education at bedside with this patient.  Have ordered educational booklet, RD consult for DM diet education, and DM videos.  Will place OP DM education order per protocol.  Patient will need follow-up with PCP after d/c for further diabetes management.  MD- Please make sure patient gets a Rx for CBG meter once ready for d/c home.   Note: Will follow. Wyn Quaker RN, MSN, CDE Diabetes Coordinator Inpatient Diabetes Program [phone]

## 2012-09-28 NOTE — Progress Notes (Signed)
*  PRELIMINARY RESULTS* Vascular Ultrasound Lower extremity venous duplex has been completed.  Preliminary findings: Right= DVT involving the mid femoral vein, behind a valve leaflet. DVT also in the gastroc vein. Left = DVT involving the gastroc vein.  Landry Mellow, RDMS, RVT 09/28/2012, 1:31 PM

## 2012-09-29 ENCOUNTER — Inpatient Hospital Stay (HOSPITAL_COMMUNITY): Payer: Medicare PPO

## 2012-09-29 DIAGNOSIS — I10 Essential (primary) hypertension: Secondary | ICD-10-CM | POA: Diagnosis present

## 2012-09-29 DIAGNOSIS — I70269 Atherosclerosis of native arteries of extremities with gangrene, unspecified extremity: Secondary | ICD-10-CM

## 2012-09-29 LAB — TYPE AND SCREEN
ABO/RH(D): A POS
Antibody Screen: NEGATIVE
Unit division: 0
Unit division: 0
Unit division: 0
Unit division: 0
Unit division: 0
Unit division: 0

## 2012-09-29 LAB — BASIC METABOLIC PANEL
BUN: 27 mg/dL — ABNORMAL HIGH (ref 6–23)
CO2: 25 mEq/L (ref 19–32)
Calcium: 8.1 mg/dL — ABNORMAL LOW (ref 8.4–10.5)
Chloride: 101 mEq/L (ref 96–112)
Creatinine, Ser: 1.02 mg/dL (ref 0.50–1.35)
GFR calc Af Amer: 85 mL/min — ABNORMAL LOW (ref >90)
GFR calc non Af Amer: 73 mL/min — ABNORMAL LOW (ref >90)
Glucose, Bld: 101 mg/dL — ABNORMAL HIGH (ref 70–99)
Potassium: 4 mEq/L (ref 3.5–5.1)
Sodium: 135 mEq/L (ref 135–145)

## 2012-09-29 LAB — GLUCOSE, CAPILLARY
Glucose-Capillary: 104 mg/dL — ABNORMAL HIGH (ref 70–99)
Glucose-Capillary: 95 mg/dL (ref 70–99)
Glucose-Capillary: 101 mg/dL — ABNORMAL HIGH (ref 70–99)
Glucose-Capillary: 99 mg/dL (ref 70–99)
Glucose-Capillary: 100 mg/dL — ABNORMAL HIGH (ref 70–99)
Glucose-Capillary: 95 mg/dL (ref 70–99)

## 2012-09-29 LAB — TISSUE CULTURE: Culture: NO GROWTH

## 2012-09-29 LAB — CBC
HCT: 23.9 % — ABNORMAL LOW (ref 39.0–52.0)
Hemoglobin: 8.1 g/dL — ABNORMAL LOW (ref 13.0–17.0)
MCH: 23.8 pg — ABNORMAL LOW (ref 26.0–34.0)
MCHC: 33.9 g/dL (ref 30.0–36.0)
MCV: 70.1 fL — ABNORMAL LOW (ref 78.0–100.0)
Platelets: 283 10*3/uL (ref 150–400)
RBC: 3.41 MIL/uL — ABNORMAL LOW (ref 4.22–5.81)
RDW: 21 % — ABNORMAL HIGH (ref 11.5–15.5)
WBC: 14 10*3/uL — ABNORMAL HIGH (ref 4.0–10.5)

## 2012-09-29 LAB — HEPARIN LEVEL (UNFRACTIONATED)
Heparin Unfractionated: 0.9 IU/mL — ABNORMAL HIGH (ref 0.30–0.70)
Heparin Unfractionated: 0.68 IU/mL (ref 0.30–0.70)
Heparin Unfractionated: 0.51 IU/mL (ref 0.30–0.70)

## 2012-09-29 MED ORDER — DOCUSATE SODIUM 100 MG PO CAPS
200.0000 mg | ORAL_CAPSULE | Freq: Every day | ORAL | Status: DC
  Administered 2012-09-29 – 2012-09-30 (×2): 200 mg via ORAL
  Filled 2012-09-29 (×6): qty 2
  Filled 2012-09-29: qty 1
  Filled 2012-09-29: qty 2

## 2012-09-29 MED ORDER — METOPROLOL TARTRATE 12.5 MG HALF TABLET
12.5000 mg | ORAL_TABLET | Freq: Two times a day (BID) | ORAL | Status: DC
  Administered 2012-09-29 – 2012-10-06 (×14): 12.5 mg via ORAL
  Filled 2012-09-29 (×15): qty 1

## 2012-09-29 MED ORDER — BISACODYL 5 MG PO TBEC: 10.0000 mg | DELAYED_RELEASE_TABLET | Freq: Every day | ORAL | Status: DC | PRN

## 2012-09-29 MED ORDER — ONDANSETRON HCL 4 MG/2ML IJ SOLN: 4.0000 mg | Freq: Four times a day (QID) | INTRAMUSCULAR | Status: DC | PRN

## 2012-09-29 MED ORDER — SODIUM CHLORIDE 0.9 % IJ SOLN
3.0000 mL | Freq: Two times a day (BID) | INTRAMUSCULAR | Status: DC
  Administered 2012-10-01 – 2012-10-04 (×2): 3 mL via INTRAVENOUS

## 2012-09-29 MED ORDER — MOVING RIGHT ALONG BOOK
Freq: Once | Status: AC
  Administered 2012-09-30: 10:00:00
  Filled 2012-09-29: qty 1

## 2012-09-29 MED ORDER — BISACODYL 10 MG RE SUPP: 10.0000 mg | Freq: Every day | RECTAL | Status: DC | PRN

## 2012-09-29 MED ORDER — OXYCODONE HCL 5 MG PO TABS
5.0000 mg | ORAL_TABLET | ORAL | Status: DC | PRN
  Administered 2012-09-29 – 2012-10-02 (×10): 10 mg via ORAL
  Administered 2012-10-03: 5 mg via ORAL
  Administered 2012-10-03 – 2012-10-05 (×4): 10 mg via ORAL
  Filled 2012-09-29 (×7): qty 2
  Filled 2012-09-29: qty 1
  Filled 2012-09-29 (×8): qty 2

## 2012-09-29 MED ORDER — FUROSEMIDE 40 MG PO TABS
40.0000 mg | ORAL_TABLET | Freq: Every day | ORAL | Status: AC
  Administered 2012-09-29 – 2012-10-01 (×3): 40 mg via ORAL
  Filled 2012-09-29 (×3): qty 1

## 2012-09-29 MED ORDER — HEPARIN (PORCINE) IN NACL 100-0.45 UNIT/ML-% IJ SOLN
1600.0000 [IU]/h | INTRAMUSCULAR | Status: DC
  Administered 2012-09-29 – 2012-09-30 (×2): 1450 [IU]/h via INTRAVENOUS
  Administered 2012-10-01 – 2012-10-02 (×4): 1600 [IU]/h via INTRAVENOUS
  Filled 2012-09-29 (×10): qty 250

## 2012-09-29 MED ORDER — SODIUM CHLORIDE 0.9 % IV SOLN: 250.0000 mL | INTRAVENOUS | Status: DC | PRN

## 2012-09-29 MED ORDER — ONDANSETRON HCL 4 MG PO TABS: 4.0000 mg | ORAL_TABLET | Freq: Four times a day (QID) | ORAL | Status: DC | PRN

## 2012-09-29 MED ORDER — WARFARIN SODIUM 2.5 MG PO TABS: 2.5000 mg | ORAL_TABLET | Freq: Every day | ORAL | Status: DC

## 2012-09-29 MED ORDER — SODIUM CHLORIDE 0.9 % IJ SOLN: 3.0000 mL | INTRAMUSCULAR | Status: DC | PRN

## 2012-09-29 MED ORDER — POLYSACCHARIDE IRON COMPLEX 150 MG PO CAPS
150.0000 mg | ORAL_CAPSULE | Freq: Every day | ORAL | Status: DC
  Administered 2012-09-29 – 2012-10-06 (×8): 150 mg via ORAL
  Filled 2012-09-29 (×8): qty 1

## 2012-09-29 MED ORDER — PANTOPRAZOLE SODIUM 40 MG PO TBEC
40.0000 mg | DELAYED_RELEASE_TABLET | Freq: Every day | ORAL | Status: DC
  Administered 2012-09-30 – 2012-10-06 (×7): 40 mg via ORAL
  Filled 2012-09-29 (×7): qty 1

## 2012-09-29 MED ORDER — POTASSIUM CHLORIDE CRYS ER 20 MEQ PO TBCR
20.0000 meq | EXTENDED_RELEASE_TABLET | Freq: Two times a day (BID) | ORAL | Status: AC
  Administered 2012-09-29 – 2012-09-30 (×4): 20 meq via ORAL
  Filled 2012-09-29 (×4): qty 1

## 2012-09-29 NOTE — Progress Notes (Addendum)
[facility]  Subjective  -   No complaints today Family at bedside Resting comfortably   Physical Exam:  CV:  RRR Pulm:  CTAB Ext:  Right foot incision with black eschar without evidence of infection       Assessment/Plan:  Events of this hospitalization noted Discussed with the family that he may have difficulty healing his right leg incision without revascularization.  I will plan on proceeding with angiography this coming Tuesday.  At that time, if he has options for percutaneoous revascularization, I will proceed at that time.  If he needs surgical revascularization, if at all possible, I would like to give him some time to recover from his cardiac surgery.  The timing of bypass will need to be dica=tated by the appearance of his right foot.  I will see him again on Monday  Would hold off on oral anticoagulation until after his angiogram   Ryan [name] IV, V. WELLS 09/29/2012 5:09 PM --  Filed Vitals:   09/29/12 1421  BP: 131/59  Pulse: 96  Temp: 97.1 F (36.2 C)  Resp: 18    Intake/Output Summary (Last 24 hours) at 09/29/12 1709 Last data filed at 09/29/12 1305  Gross per 24 hour  Intake   1029 ml  Output   1500 ml  Net   -471 ml     Laboratory CBC    Component Value Date/Time   WBC 14.0* 09/29/2012 0510   HGB 8.1* 09/29/2012 0510   HCT 23.9* 09/29/2012 0510   PLT 283 09/29/2012 0510    BMET    Component Value Date/Time   NA 135 09/29/2012 0510   K 4.0 09/29/2012 0510   CL 101 09/29/2012 0510   CO2 25 09/29/2012 0510   GLUCOSE 101* 09/29/2012 0510   BUN 27* 09/29/2012 0510   CREATININE 1.02 09/29/2012 0510   CALCIUM 8.1* 09/29/2012 0510   GFRNONAA 73* 09/29/2012 0510   GFRAA 85* 09/29/2012 0510    COAG Lab Results  Component Value Date   INR 1.77* 09/26/2012   INR 1.21 09/21/2012   INR 1.25 09/13/2012   No results found for this basename: PTT    Antibiotics Anti-infectives     Start     Dose/Rate Route Frequency  Ordered Stop   09/26/12 2200   cefUROXime (ZINACEF) 1.5 g in dextrose 5 % 50 mL IVPB  Status:  Discontinued        1.5 g 100 mL/hr over 30 Minutes Intravenous Every 12 hours 09/26/12 1331 09/27/12 0830   09/26/12 2000   vancomycin (VANCOCIN) IVPB 1000 mg/200 mL premix        1,000 mg 200 mL/hr over 60 Minutes Intravenous  Once 09/26/12 1331 09/26/12 2053   09/26/12 0600   cefUROXime (ZINACEF) 750 mg in dextrose 5 % 50 mL IVPB  Status:  Discontinued        750 mg 100 mL/hr over 30 Minutes Intravenous  Once 09/25/12 1649 09/26/12 0517   09/26/12 0400   vancomycin (VANCOCIN) 1,500 mg in sodium chloride 0.9 % 250 mL IVPB        1,500 mg 125 mL/hr over 120 Minutes Intravenous To Surgery 09/25/12 1512 09/26/12 0800   09/26/12 0400   cefUROXime (ZINACEF) 1.5 g in dextrose 5 % 50 mL IVPB        1.5 g 100 mL/hr over 30 Minutes Intravenous To Surgery 09/25/12 1512 09/26/12 1211   09/26/12 0400   cefUROXime (ZINACEF) 750 mg in dextrose  5 % 50 mL IVPB  Status:  Discontinued        750 mg 100 mL/hr over 30 Minutes Intravenous To Surgery 09/25/12 1727 09/26/12 1222   09/16/12 0800   fluconazole (DIFLUCAN) tablet 150 mg        150 mg Oral  Once 09/16/12 0619 09/16/12 1204   09/15/12 1400   ceFAZolin (ANCEF) IVPB 2 g/50 mL premix        2 g 100 mL/hr over 30 Minutes Intravenous 3 times per day 09/15/12 1110     09/13/12 1200   vancomycin (VANCOCIN) 750 mg in sodium chloride 0.9 % 150 mL IVPB  Status:  Discontinued        750 mg 150 mL/hr over 60 Minutes Intravenous Every 12 hours 09/13/12 0409 09/15/12 1110   09/13/12 0800   piperacillin-tazobactam (ZOSYN) IVPB 3.375 g  Status:  Discontinued        3.375 g 12.5 mL/hr over 240 Minutes Intravenous Every 8 hours 09/13/12 0409 09/15/12 1110   09/12/12 2300   vancomycin (VANCOCIN) IVPB 1000 mg/200 mL premix        1,000 mg 200 mL/hr over 60 Minutes Intravenous  Once 09/12/12 2253 09/13/12 0128   09/12/12 2300  piperacillin-tazobactam (ZOSYN)  IVPB 3.375 g       3.375 g 12.5 mL/hr over 240 Minutes Intravenous  Once 09/12/12 2253 09/13/12 0023           V. Leia Alf, M.D. [facility] Office: [phone] Pager:  [phone]

## 2012-09-29 NOTE — Progress Notes (Signed)
ANTICOAGULATION CONSULT NOTE - Follow Up Consult  Pharmacy Consult for Heparin Indication: DVT  No Known Allergies  Patient Measurements: Height: 6' (182.9 cm) Weight: 196 lb 3.4 oz (89 kg) IBW/kg (Calculated) : 77.6  Heparin Dosing Weight: 92kg  Vital Signs: Temp: 98 F (36.7 C) (01/10 2028) Temp src: Oral (01/10 2028) BP: 131/59 mmHg (01/10 1421) Pulse Rate: 87  (01/10 2028)  Labs:  Basename 09/29/12 2133 09/29/12 1454 09/29/12 0510 09/28/12 0356 09/27/12 1753 09/27/12 1725  HGB -- -- 8.1* 7.9* -- --  HCT -- -- 23.9* 23.1* 25.0* --  PLT -- -- 283 288 -- 259  APTT -- -- -- -- -- --  LABPROT -- -- -- -- -- --  INR -- -- -- -- -- --  HEPARINUNFRC 0.51 0.68 0.90* -- -- --  CREATININE -- -- 1.02 1.11 0.90 --  CKTOTAL -- -- -- -- -- --  CKMB -- -- -- -- -- --  TROPONINI -- -- -- -- -- --    Estimated Creatinine Clearance: 76.1 ml/min (by C-G formula based on Cr of 1.02).  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 Heparin level (0.51) is at-goal on 1450 units/hr.   Goal of Therapy:  Heparin level 0.3-0.7 units/ml Monitor platelets by anticoagulation protocol: Yes   Plan:  1. Continue IV heparin at 1450 units/hr 2. Daily CBC, heparin level  Raye Sorrow, PharmD 09/29/2012 10:50 PM

## 2012-09-29 NOTE — Evaluation (Signed)
Occupational Therapy Evaluation Patient Details Name: Ryan [name] MRN: [id number] DOB: 01/18/1946 Today's Date: 09/29/2012 Time: [id number] OT Time Calculation (min): 39 min  OT Assessment / Plan / Recommendation Clinical Impression  Pt admitted on 12/24 with multiple new CVAs, cervical stenosis, infection of right wrist and right foot. 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  Pt will benefit from acute OT services to increase independence and safety with ADLs and functional mobility. Recommending CIR to further progress rehab before pt returns home    OT Assessment  Patient needs continued OT Services    Follow Up Recommendations  CIR    Barriers to Discharge      Equipment Recommendations  3 in 1 bedside comode;Tub/shower bench    Recommendations for Other Services Rehab consult  Frequency  Min 2X/week    Precautions / Restrictions Precautions Precautions: Fall;Sternal Restrictions Weight Bearing Restrictions: Yes RUE Weight Bearing: Non weight bearing RLE Weight Bearing: Weight bearing as tolerated   Pertinent Vitals/Pain See vitals    ADL  Grooming: Performed;Wash/dry face;Set up Where Assessed - Grooming: Supported sitting Upper Body Bathing: Simulated;Minimal assistance Where Assessed - Upper Body Bathing: Supported sitting Lower Body Bathing: Simulated;+1 Total assistance Where Assessed - Lower Body Bathing: Supported sit to stand Upper Body Dressing: Simulated;Moderate assistance Where Assessed - Upper Body Dressing: Supported sitting Lower Body Dressing: Simulated;+1 Total assistance Where Assessed - Lower Body Dressing: Supported sit to Lobbyist: Haematologist: Patient Percentage: 50% Armed forces technical officer Method: Sit to Loss adjuster, chartered:  (chair) Toileting - Clothing Manipulation and Hygiene: Simulated;+1 Total  assistance Where Assessed - Toileting Clothing Manipulation and Hygiene: Standing Transfers/Ambulation Related to ADLs: Pt ambulated with +2 total assist (pt=50%) with bil UE assist from PT/OT.  Narrow base of support with ambulation.   ADL Comments: Pt sitting in chair upon arrival.  Pt with great difficulty performing Right digit AROM (reports he has not been moving right fingers).  Performed AAROM to right digits and educated pt on technique as well as performing AAROM at least 2-3 x daily.  Pt also demonstrated decreased neck AROM/PROM.  Educated pt and wife to perfrom neck ROM exercises throughout day (pt with chronic tendency to keep neck flexed but wife and pt report it has gotten much worse in past week).  Also discussed with nursing to avoid keeping pillows behind pt's head that position pt in flexed neck position.    OT Diagnosis: Generalized weakness;Acute pain;Cognitive deficits  OT Problem List: Decreased strength;Decreased coordination;Decreased range of motion;Decreased safety awareness;Decreased activity tolerance;Impaired balance (sitting and/or standing);Decreased knowledge of use of DME or AE;Pain;Impaired UE functional use;Impaired sensation;Decreased knowledge of precautions OT Treatment Interventions: Self-care/ADL training;Therapeutic activities;Therapeutic exercise;Neuromuscular education;DME and/or AE instruction;Balance training;Patient/family education   OT Goals Acute Rehab OT Goals OT Goal Formulation: With patient/family Time For Goal Achievement: 10/13/12 Potential to Achieve Goals: Good ADL Goals Pt Will Perform Grooming: with supervision;Standing at sink ADL Goal: Grooming - Progress: Goal set today Pt Will Perform Upper Body Bathing: with supervision;Sitting, chair;Sitting, edge of bed ADL Goal: Upper Body Bathing - Progress: Goal set today Pt Will Perform Lower Body Bathing: with mod assist;Sit to stand from bed;with adaptive equipment ADL Goal: Lower Body  Bathing - Progress: Goal set today Pt Will Perform Upper Body Dressing: with supervision;Unsupported;Sitting, bed ADL Goal: Upper Body Dressing - Progress: Goal set today Pt Will Perform Lower Body Dressing: with mod assist;Unsupported;Sit to  stand from bed ADL Goal: Lower Body Dressing - Progress: Goal set today Pt Will Transfer to Toilet: with min assist;Ambulation;with DME;Comfort height toilet ADL Goal: Toilet Transfer - Progress: Goal set today Pt Will Perform Toileting - Clothing Manipulation: with mod assist;Sitting on 3-in-1 or toilet;Standing ADL Goal: Toileting - Clothing Manipulation - Progress: Goal set today Pt Will Perform Toileting - Hygiene: with mod assist;Sit to stand from 3-in-1/toilet ADL Goal: Toileting - Hygiene - Progress: Goal set today Miscellaneous OT Goals Miscellaneous OT Goal #1: Pt will independently perform right hand AROM/AAROM 2-3 x daily. OT Goal: Miscellaneous Goal #1 - Progress: Goal set today Miscellaneous OT Goal #2: Pt will independently perform neck AROM 2-3 x daily. OT Goal: Miscellaneous Goal #2 - Progress: Goal set today Miscellaneous OT Goal #3: Pt will independently maintain sternal precautions during functional mobility. OT Goal: Miscellaneous Goal #3 - Progress: Goal set today  Visit Information  Last OT Received On: 09/29/12 Assistance Needed: +2 PT/OT Co-Evaluation/Treatment: Yes    Subjective Data      Prior Functioning     Prior Function Level of Independence: Independent         Vision/Perception     Cognition  Overall Cognitive Status: Impaired Area of Impairment: Safety/judgement Arousal/Alertness: Awake/alert Orientation Level: Appears intact for tasks assessed Behavior During Session: Anxious    Extremity/Trunk Assessment Right Upper Extremity Assessment RUE ROM/Strength/Tone: Deficits RUE ROM/Strength/Tone Deficits: Pt limited to <1/2 AAROM in right digits.  Elbow and shoulder AROM WFL for tasks assessed during  session but still only ~80% of normal.  Did not assess wrist ROM due to dressing.  RUE Sensation: Deficits RUE Sensation Deficits: reports slight numbness in digits compared to the right RUE Coordination: Deficits Left Upper Extremity Assessment LUE ROM/Strength/Tone: [facility] for tasks assessed;Unable to fully assess;Due to precautions LUE Sensation: WFL - Light Touch LUE Coordination: WFL - gross/fine motor     Mobility Bed Mobility Bed Mobility: Not assessed Transfers Transfers: Sit to Stand;Stand to Sit Sit to Stand: 1: +2 Total assist;Without upper extremity assist;From chair/3-in-1;Other (comment) (times 3) Sit to Stand: Patient Percentage: 60% Stand to Sit: 1: +2 Total assist;Without upper extremity assist;To chair/3-in-1 Stand to Sit: Patient Percentage: 60% Details for Transfer Assistance: cues for sternal precautions,technique for coming forward over his knees, best way to scoot forward to [place]; truncal/lifting assist to help patient come forward.     Shoulder Instructions     Exercise Other Exercises Other Exercises: AAROM/PROM of individual Right hand digits. Other Exercises: Pt performed PROM/AAROM neck extension and lateral flexion to L/R sides with massaging posteriorly. Pt very tight posterior and bil sides of neck (sternocleidomastoids and trapezius).   Balance Balance Balance Assessed: Yes Static Standing Balance Static Standing - Balance Support: Bilateral upper extremity supported Static Standing - Level of Assistance: 4: Min assist Static Standing - Comment/# of Minutes: Assist to maintain balance and decrease posterior lean.  Single Leg Stance - Right Leg: 2    End of Session OT - End of Session Equipment Utilized During Treatment: Gait belt Activity Tolerance: Patient tolerated treatment well Patient left: in chair;with call bell/phone within reach;with family/visitor present Nurse Communication: Mobility status  GO   09/29/2012 Darrol Jump  OTR/L Pager [phone] Office [phone]  09/29/2012 Darrol Jump OTR/L Pager [phone] Office [phone]  Darrol Jump 09/29/2012, 1:18 PM

## 2012-09-29 NOTE — Progress Notes (Signed)
ANTICOAGULATION CONSULT NOTE - Follow Up Consult  Pharmacy Consult for Heparin Indication: DVT  No Known Allergies  Patient Measurements: Height: 6' (182.9 cm) Weight: 202 lb 9.6 oz (91.9 kg) IBW/kg (Calculated) : 77.6  Heparin Dosing Weight: 92kg  Vital Signs: Temp: 98.4 F (36.9 C) (01/10 0405) Temp src: Oral (01/10 0405) BP: 114/56 mmHg (01/10 0500) Pulse Rate: 81  (01/10 0500)  Labs:  Basename 09/29/12 0510 09/28/12 2142 09/28/12 0356 09/27/12 1753 09/27/12 1725 09/26/12 1345  HGB 8.1* -- 7.9* -- -- --  HCT 23.9* -- 23.1* 25.0* -- --  PLT 283 -- 288 -- 259 --  APTT -- -- -- -- -- 40*  LABPROT -- -- -- -- -- 20.0*  INR -- -- -- -- -- 1.77*  HEPARINUNFRC 0.90* 0.89* -- -- -- --  CREATININE 1.02 -- 1.11 0.90 -- --  CKTOTAL -- -- -- -- -- --  CKMB -- -- -- -- -- --  TROPONINI -- -- -- -- -- --    Estimated Creatinine Clearance: 76.1 ml/min (by C-G formula based on Cr of 1.02).  Assessment: 67 yo male with DVT s/p OHS 1/7, for Heparin   Goal of Therapy:  Heparin level 0.3-0.7 units/ml Monitor platelets by anticoagulation protocol: Yes   Plan:  Decrease heparin to 1500 units/hr Check heparin level in 8 hours.   Idan Prime, Bronson Curb 09/29/2012,6:26 AM

## 2012-09-29 NOTE — Progress Notes (Addendum)
3 Days Post-Op Procedure(s) (LRB): MITRAL VALVE (MV) REPLACEMENT (N/A) INTRAOPERATIVE TRANSESOPHAGEAL ECHOCARDIOGRAM (N/A) Subjective:  Ryan [name] complains of occasional chest discomfort. Mentation seems improved this morning.  He has not ambulated much.  +BM  Objective: Vital signs in last 24 hours: Temp:  [97.6 F (36.4 C)-98.4 F (36.9 C)] 98.4 F (36.9 C) (01/10 0405) Pulse Rate:  [73-95] 91  (01/10 0700) Cardiac Rhythm:  [-] Normal sinus rhythm (01/10 0000) Resp:  [11-34] 23  (01/10 0700) BP: (102-172)/(47-68) 124/57 mmHg (01/10 0700) SpO2:  [91 %-100 %] 97 % (01/10 0700) Weight:  [196 lb 3.4 oz (89 kg)] 196 lb 3.4 oz (89 kg) (01/10 0700)  Intake/Output from previous day: 01/09 0701 - 01/10 0700 In: 1059 [P.O.:605; I.V.:296; IV Piggyback:158] Out: [id number] [Urine:1355; Chest Tube:60]  General appearance: alert, cooperative and no distress Neurologic: intact Heart: regular rate and rhythm Lungs: clear to auscultation bilaterally Abdomen: soft, non-tender; bowel sounds normal; no masses,  no organomegaly Extremities: edema 1+ Wound: clean and dry  Lab Results:  Basename 09/29/12 0510 09/28/12 0356  WBC 14.0* 19.8*  HGB 8.1* 7.9*  HCT 23.9* 23.1*  PLT 283 288   BMET:  Basename 09/29/12 0510 09/28/12 0356  NA 135 133*  K 4.0 4.9  CL 101 101  CO2 25 24  GLUCOSE 101* 135*  BUN 27* 22  CREATININE 1.02 1.11  CALCIUM 8.1* 8.2*    PT/INR:  Basename 09/26/12 1345  LABPROT 20.0*  INR 1.77*   ABG    Component Value Date/Time   PHART 7.411 09/27/2012 0256   HCO3 24.7* 09/27/2012 0256   TCO2 23 09/27/2012 1753   ACIDBASEDEF 1.1 09/13/2012 0411   O2SAT 97.0 09/27/2012 0256   CBG (last 3)   Basename 09/29/12 0402 09/28/12 2303 09/28/12 1931  GLUCAP 95 104* 118*    Assessment/Plan: S/P Procedure(s) (LRB): MITRAL VALVE (MV) REPLACEMENT (N/A) INTRAOPERATIVE TRANSESOPHAGEAL ECHOCARDIOGRAM (N/A)  1. CV- Previous Atrial Fibrillation, NSR currently, Hypertension  greatly improved this morning- continue Amiodarone, Norvasc 2. Pulm- off oxygen, atelectasis bilaterally, no pneumothorax, continue IS 3. Expected Post Operative Blood Loss Anemia- will start iron 4. Renal- creatinine improved today, remains volume overloaded continue Diuresis 5. 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 DM- CBGs well controlled 7. Dispo- patient doing well, will need Coumadin for DVT, ? Transfer to step down   LOS: 17 days    Ryan [name] 09/29/2012    Chart reviewed, patient examined, agree with above. He is doing well overall. I think he can go to Ryan [name] on 2000 today.  Continue heparin for bilateral DVT and start coumadin slowly with goal INR 2.0.

## 2012-09-29 NOTE — Progress Notes (Addendum)
Patient Name: Ryan [name] Date of Encounter: 09/29/2012    SUBJECTIVE: He feels a little better today. Sitting on BSC.  TELEMETRY:  NSR: Filed Vitals:   09/29/12 0800 09/29/12 0804 09/29/12 0900 09/29/12 1000  BP: 112/56  110/62 124/70  Pulse: 83  87 89  Temp:  97.9 F (36.6 C)    TempSrc:  Oral    Resp: 12  16 28   Height:      Weight:      SpO2: 100%  99% 97%    Intake/Output Summary (Last 24 hours) at 09/29/12 1127 Last data filed at 09/29/12 1030  Gross per 24 hour  Intake   1234 ml  Output   1750 ml  Net   -516 ml    LABS: Basic Metabolic Panel:  Basename 09/29/12 0510 09/28/12 0356 09/27/12 1725 09/27/12 0415  NA 135 133* -- --  K 4.0 4.9 -- --  CL 101 101 -- --  CO2 25 24 -- --  GLUCOSE 101* 135* -- --  BUN 27* 22 -- --  CREATININE 1.02 1.11 -- --  CALCIUM 8.1* 8.2* -- --  MG -- -- 2.4 2.5  PHOS -- -- -- --   CBC:  Basename 09/29/12 0510 09/28/12 0356  WBC 14.0* 19.8*  NEUTROABS -- --  HGB 8.1* 7.9*  HCT 23.9* 23.1*  MCV 70.1* 69.8*  PLT 283 288   Radiology/Studies:  No new data  Physical Exam: Blood pressure 124/70, pulse 89, temperature 97.9 F (36.6 C), temperature source Oral, resp. rate 28, height 6' (1.829 m), weight 89 kg (196 lb 3.4 oz), SpO2 97.00%. Weight change: -2.9 kg (-6 lb 6.3 oz)   Still has a faint pericardial rub. No MR  Lower extremity edema.  ASSESSMENT:  1. Back in NSR from atrial fibrillation 2. BP better. 3. Pericardial inflammation, improved.  Plan:  1. Same therapy with Amio, amlodipine, and anticoagulation  Signed, [name] 09/29/2012, 11:27 AM

## 2012-09-29 NOTE — Progress Notes (Signed)
Family Medicine Teaching Service Daily Progress Note Service Page: [phone]  Patient Assessment: 67 y.o. year old male with acute encephalopathy (now resolved) secondary to sepsis from multiple sources, s/p I&D of R wrist and R MTP lesion (with HW removal), Multiple small R hemisphere infarcts that are likely septic emboli, C6-T1 Diskitis, pericarditis,  Endocarditis, and E. Coli UTI (s/p treatment). S/p mitral valve replacement on 1/7.  Subjective:  Pt states he feels okay this morning. No new complaints. Had some chest pain with moving to the chair this am but his pain is otherwise controlled. Some dyspnea with moving also, feels fine if he is still. Some neck pain that is about like it has been for several days.   Objective: Temp:  [97.6 F (36.4 C)-98.4 F (36.9 C)] 98.4 F (36.9 C) (01/10 0405) Pulse Rate:  [73-109] 81  (01/10 0500) Resp:  [11-34] 24  (01/10 0500) BP: (102-179)/(47-68) 114/56 mmHg (01/10 0500) SpO2:  [91 %-100 %] 100 % (01/10 0500)  Exam: Gen: NAD, alert, cooperative with exam, sitting in chair, tired appearing; clean dry intact (CDI) bandage on R  Neck where swan was removed, CDI banadge in place on L lower chest where chest tube was removed., CDI bandage on abdomen, Sternal incision clean dry and intact HEENT: NCAT, EOMI, MMM CV: RRR, good S1/S2 with additional S3 Resp: CTABL, no wheezes, non-labored Abd: Soft, diffuse mild tenderness, BS+ Ext: 1+ pitting edema BL LE, warm; dressing in place to right wrist/hand, left wrist, and right food, clean/dry/intact Neuro: Alert, oriented/appropriate in conversation, face symmetric;  speaks quietly and some slowed mentation  I have reviewed the patient's medications, labs, imaging, and diagnostic testing.  Notable results are summarized below.  CBC BMET   Lab 09/29/12 0510 09/28/12 0356 09/27/12 1753 09/27/12 1725  WBC 14.0* 19.8* -- 21.1*  HGB 8.1* 7.9* 8.5* --  HCT 23.9* 23.1* 25.0* --  PLT 283 288 -- 259    Lab  09/29/12 0510 09/28/12 0356 09/27/12 1753 09/27/12 0415  NA 135 133* 136 --  K 4.0 4.9 4.6 --  CL 101 101 104 --  CO2 25 24 -- 24  BUN 27* 22 16 --  CREATININE 1.02 1.11 0.90 --  GLUCOSE 101* 135* 107* --  CALCIUM 8.1* 8.2* -- 8.4    Blood Culture 12/26- No growth- final Blood culture 12/30- No growth- final C. Diff 1/4 - negative  Imaging/Diagnostic Tests: TEE 09/18/2012 - The estimated ejection fraction was in the range of 60% to 65%. - Aortic valve: No evidence of vegetation. - Mitral valve: Large 2.5cm diameter vegetation/ - Left atrium: No evidence of thrombus in the atrial cavity or appendage. - Right atrium: No evidence of thrombus in the atrial cavity or appendage. - Tricuspid valve: No evidence of vegetation. - Pulmonic valve: No evidence of vegetation. - Pericardium, extracardiac: A small, free-flowing pericardial effusion was identified along the right atrial free wall.  MRI Head 12/27 Several small areas of acute infarction involving the right  hemisphere including portions of the; right occipital lobe, right  temporal lobe, right opercular region, posterior right frontal lobe  and right parietal lobe.  MRI Cervical Spine 12/27 C6-7 diskitis with spread of infection to the adjacent facet  joints, spinous process and interspinous ligaments/posterior  paraspinal musculature as discussed above. Epidural abscess  suspected although evaluation limited without contrast.  CXR 09/26/2012 IMPRESSION:  Postoperative changes with support apparatus as described.  Left pleural effusion and bibasilar atelectasis - no evidence of  pneumothorax.  CXR 09/27/2012 IMPRESSION:  1. Somewhat low lung volumes with bibasilar air space disease,  left greater than right, and probable small left pleural effusion.  2. No definite pneumothorax.  Venous duplex 09/28/2012 Summary: Findings consistent with deep vein thrombosis involving the right mid femoral vein (behind a valve leaflet),  the right gastrocnemius vein and the left gastrocnemius vein.   Plan: Ryan [name] is a 67 y.o. year old male with acute encephalopathy secondary to sepsis from multiple sources.  Now post-op from mitral valve replacement on 09/26/2012.   CV- Stable off pressors - s/p Mitral valve replacement day 2 - Cardiology and CTS on board, greatly appreciate recommendations - Lasix restart per cards/CTS, 40 IV BID; Cre trend last three days: 0.9 to 1.11 to 1.02 - Heart cath on 09/21/2012, Findings:  - Widely patent coronary arteries, pulmonary HTN with PCWP of 24mmHg  - Post-op pain- PRN morphine an doxycodone   Afib with RVR- Pericarditis,  - Afib stable, Likely due to endocarditis - causing Afib, currently poorly rate controlled with amiodarone - continue per cardiology - TEE on 12/30 confirmed MV vegetation listed below and slight improvement of pericarditis  Acute Diastolic Heart failure - will likely improve s/p MVR (done 1/7) - secondary to mitral regurgitation caused by large vegetation now replaced - Lasix restarted per cards/CTS, as above - Strict I/O, daily weights - negative 469mL on 1/9  DVT - BL DVT likely from bedbound state since admission - Heparin drip per Dr. Cyndia Bent who plans to transition to warfarin - monitor  Resp-  - stable on RA - no complaints of dyspnea currently  - CXR with Small L pleural effusion, chest tube placed by CTS 1/8 and removed 1/9  ID - metastatic infection - ID on Board-   - Continue Ancef, likely will need 6-8 weeks  - per Dr. 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  - now removed s/p MVR - abx, as above  MSSA Bacteremia - Blood Cx 12/23 shows pan sensitive MSSA, Repeat  from 12/26 NGTD, Repeated on 09/18/2012- NGTD - R wrist   - OR by Hand surgery on 12/26 for debridement- Cx with mod MSSA  - Hand surgery signed off - R MTP-   - Debridment and hardware removal on 12/26   - Ortho following - appreciate reccs   - Q 3 days R foot dressings- ortho to remove sutures in areound 1/13, believe it will likely not heal unless circulation addressed.  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  - R ABI 0.48 on 1/2/214, L ABI WNL - No heparin currently due to post op and cerebral emboli - Vascular plans to perform LE angio after clearing infection with Brabham MD  Acute Kidney Injury.  - Resolved, follow daily BMET - mild increase of Cr 1/8 - 1/9, will monitor daily  Elevated Transaminases -  - Pt AST:ALT 2:1 consistent with alcoholic picture. EtOH on admission < 11  - consider recheck LFT's  Newly Dx DM II - A1C 6.5 on check  - Insulin drip 1/8, transitioned to subq insulin - CBG's in the low 100's - Consider sensitive SSI if starts to develop hyperglycemia, Blood glucose fine for now.   FEN/GI:  - Carb modified diet  - KVO NS - Strict I/O  Prophylaxis: SCD Disposition: Step down for now, CIR vs SNF with PT after workup.   Kenn File, MD 09/29/2012, 6:29 AM

## 2012-09-29 NOTE — Progress Notes (Signed)
Physical Therapy Treatment Patient Details Name: Ryan [name] MRN: [id number] DOB: February 20, 1946 Today's Date: 09/29/2012 Time: [id number] PT Time Calculation (min): 39 min  PT Assessment / Plan / Recommendation Comments on Treatment Session  Emphasis placed on getting pt to move, giving him "permission" to move, working on transfer technique and gait.    Follow Up Recommendations  CIR     Does the patient have the potential to tolerate intense rehabilitation     Barriers to Discharge        Equipment Recommendations  Rolling walker with 5" wheels    Recommendations for Other Services Rehab consult  Frequency Min 3X/week   Plan Discharge plan remains appropriate;Frequency remains appropriate    Precautions / Restrictions Precautions Precautions: Fall;Sternal Restrictions Weight Bearing Restrictions: Yes RUE Weight Bearing: Non weight bearing RLE Weight Bearing: Weight bearing as tolerated   Pertinent Vitals/Pain     Mobility  Bed Mobility Bed Mobility: Not assessed Transfers Transfers: Sit to Stand;Stand to Sit Sit to Stand: 1: +2 Total assist;Without upper extremity assist;From chair/3-in-1;Other (comment) (times 3) Sit to Stand: Patient Percentage: 60% Stand to Sit: 1: +2 Total assist;Without upper extremity assist;To chair/3-in-1 Stand to Sit: Patient Percentage: 60% Details for Transfer Assistance: cues for sternal precautions,technique for coming forward over his knees, best way to scoot forward to [place]; truncal/lifting assist to help patient come forward. Ambulation/Gait Ambulation/Gait Assistance: 1: +2 Total assist Ambulation/Gait: Patient Percentage: 50% Ambulation Distance (Feet): 40 Feet (times 2 with rest between) Assistive device: 2 person hand held assist Ambulation/Gait Assistance Details: stiff choppy gait with flexed posture and heavy use of UE's Gait Pattern: Step-through pattern;Decreased step length - right;Decreased step length - left;Decreased  stride length;Trunk flexed;Narrow base of support Gait velocity: decreased Stairs: No Wheelchair Mobility Wheelchair Mobility: No    Exercises Other Exercises Other Exercises: cervical P/AAROM with stretches and massage   PT Diagnosis:    PT Problem List:   PT Treatment Interventions:     PT Goals Acute Rehab PT Goals PT Goal Formulation: With patient/family Time For Goal Achievement: 10/03/12 Potential to Achieve Goals: Good PT Goal: Sit to Stand - Progress: Progressing toward goal PT Goal: Stand to Sit - Progress: Progressing toward goal PT Transfer Goal: Bed to Chair/Chair to Bed - Progress: Progressing toward goal PT Goal: Stand - Progress: Progressing toward goal PT Goal: Ambulate - Progress: Progressing toward goal  Visit Information  Last PT Received On: 09/29/12 Assistance Needed: +2 PT/OT Co-Evaluation/Treatment: Yes    Subjective Data  Subjective: so ... I can move anyway I want to?   Cognition  Overall Cognitive Status: Impaired Area of Impairment: Safety/judgement Arousal/Alertness: Awake/alert Orientation Level: Appears intact for tasks assessed Behavior During Session: Anxious    Balance     End of Session PT - End of Session Equipment Utilized During Treatment: Gait belt Activity Tolerance: Patient tolerated treatment well Patient left: in chair;with call bell/phone within reach;with family/visitor present Nurse Communication: Mobility status   GP     Ryan [name], Tessie Fass 09/29/2012, 11:51 AM  09/29/2012  Donnella Sham, PT [phone] (pager)

## 2012-09-29 NOTE — Clinical Social Work Note (Signed)
Clinical Social Work  CSW continuing to follow discharge needs. Currently, PT is recommending CIR at discharge. CSW is following for possible SNF placement, if insurance denies CIR. Pt transferred to unit and will hand off unit CSW. CSW will continue to follow.   Ryan [name], MSW, [place]

## 2012-09-29 NOTE — Progress Notes (Signed)
INFECTIOUS DISEASE PROGRESS NOTE  ID: Ryan [name] is a 67 y.o. 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  Hypokalemia  Diabetes mellitus  Subjective: No further loose BM.  Is up ambulating with PT  Abtx:  Anti-infectives     Start     Dose/Rate Route Frequency Ordered Stop   09/26/12 2200   cefUROXime (ZINACEF) 1.5 g in dextrose 5 % 50 mL IVPB  Status:  Discontinued        1.5 g 100 mL/hr over 30 Minutes Intravenous Every 12 hours 09/26/12 1331 09/27/12 0830   09/26/12 2000   vancomycin (VANCOCIN) IVPB 1000 mg/200 mL premix        1,000 mg 200 mL/hr over 60 Minutes Intravenous  Once 09/26/12 1331 09/26/12 2053   09/26/12 0600   cefUROXime (ZINACEF) 750 mg in dextrose 5 % 50 mL IVPB  Status:  Discontinued        750 mg 100 mL/hr over 30 Minutes Intravenous  Once 09/25/12 1649 09/26/12 0517   09/26/12 0400   vancomycin (VANCOCIN) 1,500 mg in sodium chloride 0.9 % 250 mL IVPB        1,500 mg 125 mL/hr over 120 Minutes Intravenous To Surgery 09/25/12 1512 09/26/12 0800   09/26/12 0400   cefUROXime (ZINACEF) 1.5 g in dextrose 5 % 50 mL IVPB        1.5 g 100 mL/hr over 30 Minutes Intravenous To Surgery 09/25/12 1512 09/26/12 1211   09/26/12 0400   cefUROXime (ZINACEF) 750 mg in dextrose 5 % 50 mL IVPB  Status:  Discontinued        750 mg 100 mL/hr over 30 Minutes Intravenous To Surgery 09/25/12 1727 09/26/12 1222   09/16/12 0800   fluconazole (DIFLUCAN) tablet 150 mg        150 mg Oral  Once 09/16/12 0619 09/16/12 1204   09/15/12 1400   ceFAZolin (ANCEF) IVPB  2 g/50 mL premix        2 g 100 mL/hr over 30 Minutes Intravenous 3 times per day 09/15/12 1110     09/13/12 1200   vancomycin (VANCOCIN) 750 mg in sodium chloride 0.9 % 150 mL IVPB  Status:  Discontinued        750 mg 150 mL/hr over 60 Minutes Intravenous Every 12 hours 09/13/12 0409 09/15/12 1110   09/13/12 0800   piperacillin-tazobactam (ZOSYN) IVPB 3.375 g  Status:  Discontinued        3.375 g 12.5 mL/hr over 240 Minutes Intravenous Every 8 hours 09/13/12 0409 09/15/12 1110   09/12/12 2300   vancomycin (VANCOCIN) IVPB 1000 mg/200 mL premix        1,000 mg 200 mL/hr over 60 Minutes Intravenous  Once 09/12/12 2253 09/13/12 0128   09/12/12 2300  piperacillin-tazobactam (ZOSYN) IVPB 3.375 g       3.375 g 12.5 mL/hr over 240 Minutes Intravenous  Once 09/12/12 2253 09/13/12 0023          Medications:  Scheduled:   . acetaminophen  1,000 mg Oral Q6H   Or  . acetaminophen (TYLENOL) oral liquid 160 mg/5 mL  975 mg Per  Tube Q6H  . amiodarone  200 mg Oral BID  . amLODipine  2.5 mg Oral Daily  . atorvastatin  40 mg Oral q1800  . bisacodyl  10 mg Oral Daily   Or  . bisacodyl  10 mg Rectal Daily  .  ceFAZolin (ANCEF) IV  2 g Intravenous Q8H  . colchicine  0.6 mg Oral BID  . docusate sodium  200 mg Oral Daily  . feeding supplement  1 Container Oral TID WC  . furosemide  40 mg Oral Daily  . insulin aspart  0-24 Units Subcutaneous Q4H  . insulin regular  0-10 Units Intravenous TID WC  . iron polysaccharides  150 mg Oral Daily  . pantoprazole  40 mg Oral Daily  . potassium chloride  20 mEq Oral BID  . sodium chloride  3 mL Intravenous Q12H    Objective: Vital signs in last 24 hours: Temp:  [97.6 F (36.4 C)-98.4 F (36.9 C)] 97.9 F (36.6 C) (01/10 0804) Pulse Rate:  [73-91] 89  (01/10 1000) Resp:  [11-28] 28  (01/10 1000) BP: (102-167)/(47-70) 124/70 mmHg (01/10 1000) SpO2:  [91 %-100 %] 97 % (01/10 1000) Weight:  [89 kg (196 lb 3.4 oz)] 89 kg (196 lb 3.4 oz) (01/10  0700)   General appearance: alert, cooperative, mild distress and stiffness while trying to walk. midline chest wound is clean.   Lab Results  Basename 09/29/12 0510 09/28/12 0356  WBC 14.0* 19.8*  HGB 8.1* 7.9*  HCT 23.9* 23.1*  NA 135 133*  K 4.0 4.9  CL 101 101  CO2 25 24  BUN 27* 22  CREATININE 1.02 1.11  GLU -- --   Liver Panel No results found for this basename: PROT:2,ALBUMIN:2,AST:2,ALT:2,ALKPHOS:2,BILITOT:2,BILIDIR:2,IBILI:2 in the last 72 hours Sedimentation Rate No results found for this basename: ESRSEDRATE in the last 72 hours C-Reactive Protein No results found for this basename: CRP:2 in the last 72 hours  Microbiology: Recent Results (from the past 240 hour(s))  MRSA PCR SCREENING     Status: Normal   Collection Time   09/21/12  2:45 PM      Component Value Range Status Comment   MRSA by PCR NEGATIVE  NEGATIVE Final   CLOSTRIDIUM DIFFICILE BY PCR     Status: Normal   Collection Time   09/23/12  7:34 PM      Component Value Range Status Comment   C difficile by pcr NEGATIVE  NEGATIVE Final   TISSUE CULTURE     Status: Normal   Collection Time   09/26/12 10:22 AM      Component Value Range Status Comment   Specimen Description TISSUE   Final    Special Requests     Final    Value: PORTION OF MITRAL VALVE LEAFLETS PT ON ZINACEF ANCEG AND VANCO   Gram Stain     Final    Value: RARE WBC PRESENT, PREDOMINANTLY PMN     NO ORGANISMS SEEN   Culture NO GROWTH 3 DAYS   Final    Report Status 09/29/2012 FINAL   Final     Studies/Results: Dg Chest Port 1 View  09/29/2012  *RADIOLOGY REPORT*  Clinical Data: Postoperative for mitral valve replacement.  PORTABLE CHEST - 1 VIEW  Comparison: 09/28/2012  Findings: Prosthetic valve projects centrally over the cardiac shadow.  Left PICC line tip projects over the SVC.  The right IJ line, bilateral chest tubes, and mediastinal drain have been removed.  No pneumothorax identified.  Mild to moderate  cardiomegaly noted.  Mild  airspace opacity is noted in the left lower lobe.  There is linear subsegmental atelectasis at the right lung base.  IMPRESSION:  1.  Chest tubes and right IJ line have been removed.  No pneumothorax. 2.  Mild airspace opacity at the left lung base, probably atelectasis.  Subsegmental atelectasis at the right lung base. 3.  Cardiomegaly   Original Report Authenticated By: Van Clines, M.D.    Dg Chest Portable 1 View In Am  09/28/2012  *RADIOLOGY REPORT*  Clinical Data: Postop cardiac surgery, follow-up  PORTABLE CHEST - 1 VIEW  Comparison: Portable chest x-ray of 09/27/2012  Findings: There is little change in aeration with mild basilar volume loss present.  Cardiomegaly is stable.  A left chest tube remains and no definite left pneumothorax is seen.  Left PICC line and two right central venous lines are unchanged in position.  A right chest tube also is present with no pneumothorax.  IMPRESSION:  1.  No change in aeration with mild basilar atelectasis. 2.  Bilateral chest tubes remain with no pneumothorax. 3.  No change in position of central venous lines.   Original Report Authenticated By: Ivar Drape, M.D.    Dg Chest Port 1 View  09/27/2012  *RADIOLOGY REPORT*  Clinical Data: PICC placement.  PORTABLE CHEST - 1 VIEW  Comparison: Portable chest 09/27/2012 at 6:01 a.m.  Findings: The patient has a new left PICC with the tip projecting over the right atrium.  The catheter could be withdrawn 2.5-3 cm for better positioning.  Support tubes lines are otherwise unchanged.  Left basilar atelectasis and small effusion appear improved.  There is cardiomegaly.  No pneumothorax identified.  IMPRESSION:  1.  Tip of left PICC projects in the right atrium.  The catheter could be withdrawn 2.5- 3 cm. 2.  Improved left basilar atelectasis and effusion.   Original Report Authenticated By: Orlean Patten, M.D.      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  Consider C diff testing if further diarrhea, for now resolved.   Available if questions  Bobby Rumpf Infectious Diseases [id number] 09/29/2012, 10:52 AM   LOS: 17 days

## 2012-09-29 NOTE — Progress Notes (Signed)
ANTICOAGULATION CONSULT NOTE - Follow Up Consult  Pharmacy Consult for Heparin Indication: DVT  No Known Allergies  Patient Measurements: Height: 6' (182.9 cm) Weight: 196 lb 3.4 oz (89 kg) IBW/kg (Calculated) : 77.6  Heparin Dosing Weight: 92kg  Vital Signs: Temp: 97.1 F (36.2 C) (01/10 1421) Temp src: Oral (01/10 1421) BP: 131/59 mmHg (01/10 1421) Pulse Rate: 96  (01/10 1421)  Labs:  Basename 09/29/12 1454 09/29/12 0510 09/28/12 2142 09/28/12 0356 09/27/12 1753 09/27/12 1725  HGB -- 8.1* -- 7.9* -- --  HCT -- 23.9* -- 23.1* 25.0* --  PLT -- 283 -- 288 -- 259  APTT -- -- -- -- -- --  LABPROT -- -- -- -- -- --  INR -- -- -- -- -- --  HEPARINUNFRC 0.68 0.90* 0.89* -- -- --  CREATININE -- 1.02 -- 1.11 0.90 --  CKTOTAL -- -- -- -- -- --  CKMB -- -- -- -- -- --  TROPONINI -- -- -- -- -- --    Estimated Creatinine Clearance: 76.1 ml/min (by C-G formula based on Cr of 1.02).  Assessment: 67 yo male with DVT s/p OHS 1/7 on Heparin. Level now therapeutic, but on higher end of goal. No bleeding noted and CBC is low-stable. Plts wnl.    Goal of Therapy:  Heparin level 0.3-0.7 units/ml Monitor platelets by anticoagulation protocol: Yes   Plan:  Decrease heparin to 1450 units/hr Check heparin level in 6 hours, timed collect  Thank you,  Francesca Jewett, PharmD, BCPS 09/29/2012 3:32 PM

## 2012-09-29 NOTE — Progress Notes (Signed)
FMTS Attending Note Patient seen and examined by me today, discussed with resident team and I agree with Dr Alen Bleacher note for today.  Dalbert Mayotte, MD

## 2012-09-30 LAB — GLUCOSE, CAPILLARY
Glucose-Capillary: 121 mg/dL — ABNORMAL HIGH (ref 70–99)
Glucose-Capillary: 102 mg/dL — ABNORMAL HIGH (ref 70–99)
Glucose-Capillary: 93 mg/dL (ref 70–99)

## 2012-09-30 LAB — PREPARE RBC (CROSSMATCH)

## 2012-09-30 LAB — BASIC METABOLIC PANEL
BUN: 20 mg/dL (ref 6–23)
CO2: 26 mEq/L (ref 19–32)
Calcium: 7.9 mg/dL — ABNORMAL LOW (ref 8.4–10.5)
Chloride: 102 mEq/L (ref 96–112)
Creatinine, Ser: 0.84 mg/dL (ref 0.50–1.35)
GFR calc Af Amer: >90 mL/min (ref >90)
GFR calc non Af Amer: 88 mL/min — ABNORMAL LOW (ref >90)
Glucose, Bld: 81 mg/dL (ref 70–99)
Potassium: 4 mEq/L (ref 3.5–5.1)
Sodium: 137 mEq/L (ref 135–145)

## 2012-09-30 LAB — CBC
HCT: 21.3 % — ABNORMAL LOW (ref 39.0–52.0)
Hemoglobin: 7.1 g/dL — ABNORMAL LOW (ref 13.0–17.0)
MCH: 23.7 pg — ABNORMAL LOW (ref 26.0–34.0)
MCHC: 33.3 g/dL (ref 30.0–36.0)
MCV: 71 fL — ABNORMAL LOW (ref 78.0–100.0)
Platelets: 311 10*3/uL (ref 150–400)
RBC: 3 MIL/uL — ABNORMAL LOW (ref 4.22–5.81)
RDW: 21.3 % — ABNORMAL HIGH (ref 11.5–15.5)
WBC: 13.9 10*3/uL — ABNORMAL HIGH (ref 4.0–10.5)

## 2012-09-30 LAB — CLOSTRIDIUM DIFFICILE BY PCR: Toxigenic C. Difficile by PCR: NEGATIVE

## 2012-09-30 LAB — OCCULT BLOOD X 1 CARD TO LAB, STOOL: Fecal Occult Bld: NEGATIVE

## 2012-09-30 LAB — HEPARIN LEVEL (UNFRACTIONATED): Heparin Unfractionated: 0.6 IU/mL (ref 0.30–0.70)

## 2012-09-30 MED ORDER — FUROSEMIDE 10 MG/ML IJ SOLN
INTRAMUSCULAR | Status: AC
  Administered 2012-09-30: 20 mg via INTRAVENOUS
  Filled 2012-09-30: qty 4

## 2012-09-30 MED ORDER — FUROSEMIDE 10 MG/ML IJ SOLN
20.0000 mg | Freq: Once | INTRAMUSCULAR | Status: AC
  Administered 2012-09-30: 20 mg via INTRAVENOUS

## 2012-09-30 MED ORDER — METRONIDAZOLE 500 MG PO TABS
500.0000 mg | ORAL_TABLET | Freq: Three times a day (TID) | ORAL | Status: DC
  Administered 2012-09-30 – 2012-10-01 (×2): 500 mg via ORAL
  Filled 2012-09-30 (×5): qty 1

## 2012-09-30 MED ORDER — FUROSEMIDE 10 MG/ML IJ SOLN
20.0000 mg | Freq: Once | INTRAMUSCULAR | Status: AC
  Administered 2012-10-01: 20 mg via INTRAVENOUS

## 2012-09-30 NOTE — Progress Notes (Signed)
ANTICOAGULATION CONSULT NOTE - Follow Up Consult  Pharmacy Consult for heparin Indication: DVT  No Known Allergies  Patient Measurements: Height: 6' (182.9 cm) Weight: 193 lb 3.2 oz (87.635 kg) IBW/kg (Calculated) : 77.6  Heparin Dosing Weight: 87kg  Vital Signs: Temp: 97.9 F (36.6 C) (01/11 0518) Temp src: Oral (01/11 0518) BP: 114/59 mmHg (01/11 0518) Pulse Rate: 88  (01/11 0518)  Labs:  Basename 09/30/12 0516 09/30/12 0511 09/29/12 2133 09/29/12 1454 09/29/12 0510 09/28/12 0356  HGB -- 7.1* -- -- 8.1* --  HCT -- 21.3* -- -- 23.9* 23.1*  PLT -- 311 -- -- 283 288  APTT -- -- -- -- -- --  LABPROT -- -- -- -- -- --  INR -- -- -- -- -- --  HEPARINUNFRC 0.60 -- 0.51 0.68 -- --  CREATININE -- 0.84 -- -- 1.02 1.11  CKTOTAL -- -- -- -- -- --  CKMB -- -- -- -- -- --  TROPONINI -- -- -- -- -- --    Estimated Creatinine Clearance: 92.4 ml/min (by C-G formula based on Cr of 0.84).   Assessment: Patient is a 67 y.o M on heparin for DVT with plan for angiogram next week.  Holding off on starting coumadin for now until after procedure.  Hgb decreased slightly to 7.1 today.  No bleeding noted.  Heparin level is at goal.  Goal of Therapy:  Heparin level 0.3-0.7 units/ml Monitor platelets by anticoagulation protocol: Yes   Plan:  1) continue current heparin regimen  Quantez Schnyder P 09/30/2012,11:12 AM

## 2012-09-30 NOTE — Progress Notes (Signed)
FMTS Attending Note Patient seen and examined by me, discussed with resident team and I agree with Dr Alen Bleacher assessment/plan for today.  Of note, Hgb has dropped to 7.1 this morning.  Would consider transfusion.  Holding all oral anticoagulation for vascular intervention early next week.  Dalbert Mayotte, MD

## 2012-09-30 NOTE — Progress Notes (Addendum)
4 Days Post-Op Procedure(s) (LRB): MITRAL VALVE (MV) REPLACEMENT (N/A) INTRAOPERATIVE TRANSESOPHAGEAL ECHOCARDIOGRAM (N/A) Subjective:  Ryan [name] complains of chest discomfort this morning.  States he thinks he did not take pain medication overnight and pain has gotten away from him.   Objective: Vital signs in last 24 hours: Temp:  [97.1 F (36.2 C)-98 F (36.7 C)] 97.9 F (36.6 C) (01/11 0518) Pulse Rate:  [87-97] 88  (01/11 0518) Cardiac Rhythm:  [-] Normal sinus rhythm (01/10 1917) Resp:  [16-28] 19  (01/11 0518) BP: (110-131)/(55-70) 114/59 mmHg (01/11 0518) SpO2:  [93 %-100 %] 96 % (01/11 0518) Weight:  [193 lb 3.2 oz (87.635 kg)] 193 lb 3.2 oz (87.635 kg) (01/11 0106)    Intake/Output from previous day: 01/10 0701 - 01/11 0700 In: 439 [P.O.:250; I.V.:135; IV Piggyback:54] Out: 620 [Urine:620]  General appearance: alert, cooperative and no distress Heart: regular rate and rhythm Lungs: clear to auscultation bilaterally Abdomen: soft, non-tender; bowel sounds normal; no masses,  no organomegaly Extremities: edema trace Wound: clean and dry  Lab Results:  [facility] 09/30/12 0511 09/29/12 0510  WBC 13.9* 14.0*  HGB 7.1* 8.1*  HCT 21.3* 23.9*  PLT 311 283   BMET:  Basename 09/30/12 0511 09/29/12 0510  NA 137 135  K 4.0 4.0  CL 102 101  CO2 26 25  GLUCOSE 81 101*  BUN 20 27*  CREATININE 0.84 1.02  CALCIUM 7.9* 8.1*    PT/INR: No results found for this basename: LABPROT,INR in the last 72 hours ABG    Component Value Date/Time   PHART 7.411 09/27/2012 0256   HCO3 24.7* 09/27/2012 0256   TCO2 23 09/27/2012 1753   ACIDBASEDEF 1.1 09/13/2012 0411   O2SAT 97.0 09/27/2012 0256   CBG (last 3)   Basename 09/29/12 2032 09/29/12 1741 09/29/12 1135  GLUCAP 95 100* 99    Assessment/Plan: S/P Procedure(s) (LRB): MITRAL VALVE (MV) REPLACEMENT (N/A) INTRAOPERATIVE TRANSESOPHAGEAL ECHOCARDIOGRAM (N/A)  1. CV- previous A. Fib, remains in NSR continue Amiodarone,  Norvasc 2. Pulm- no acute issues, off oxygen continue IS 3. DVT- bilaterally, continue Heparin, will hold Coumadin per Vascular request plan for procedure on 1/14 4. Expected post operative anemia- worse, Hgb 7.1 this morning, will likely need transfusion, will check Hemacult 5. Endocarditis- ID following, continue ABX 6. Dispo- patient continues to make progress, will check hemocult, recheck [name]/[name] in AM, possible transfusion today   LOS: 18 days    Ryan [name], Ryan [name] 09/30/2012   I have seen and examined the patient and agree with the assessment and plan as outlined.  Will transfuse for worsened anemia.  Ryan [name],Ryan [name] 09/30/2012 1:13 PM

## 2012-09-30 NOTE — Progress Notes (Signed)
SUBJECTIVE:  No significant complaints.  He has some mild SOB  OBJECTIVE:   Vitals:   Filed Vitals:   09/29/12 1421 09/29/12 2028 09/30/12 0106 09/30/12 0518  BP: 131/59   114/59  Pulse: 96 87  88  Temp: 97.1 F (36.2 C) 98 F (36.7 C)  97.9 F (36.6 C)  TempSrc: Oral Oral  Oral  Resp: 18 19  19   Height:      Weight:   87.635 kg (193 lb 3.2 oz)   SpO2: 100% 97%  96%   I&O's:   Intake/Output Summary (Last 24 hours) at 09/30/12 0933 Last data filed at 09/29/12 1305  Gross per 24 hour  Intake    230 ml  Output    300 ml  Net    -70 ml   TELEMETRY: Reviewed telemetry pt in NSR with first degree AV block     PHYSICAL EXAM General: Well developed, well nourished, in no acute distress Head: Eyes PERRLA, No xanthomas.   Normal cephalic and atramatic  Lungs:   Clear bilaterally to auscultation anteriorly Heart:   HRRR S1 S2 Pulses are 2+ & equal. Abdomen: Bowel sounds are positive, abdomen soft and non-tender without masses  Extremities:   1+ edema bilaterally Neuro: Alert and oriented X 3. Psych:  Good affect, responds appropriately   LABS: Basic Metabolic Panel:  Basename 09/30/12 0511 09/29/12 0510 09/27/12 1725  NA 137 135 --  K 4.0 4.0 --  CL 102 101 --  CO2 26 25 --  GLUCOSE 81 101* --  BUN 20 27* --  CREATININE 0.84 1.02 --  CALCIUM 7.9* 8.1* --  MG -- -- 2.4  PHOS -- -- --   Liver Function Tests: No results found for this basename: AST:2,ALT:2,ALKPHOS:2,BILITOT:2,PROT:2,ALBUMIN:2 in the last 72 hours No results found for this basename: LIPASE:2,AMYLASE:2 in the last 72 hours CBC:  Basename 09/30/12 0511 09/29/12 0510  WBC 13.9* 14.0*  NEUTROABS -- --  HGB 7.1* 8.1*  HCT 21.3* 23.9*  MCV 71.0* 70.1*  PLT 311 283   Coag Panel:   Lab Results  Component Value Date   INR 1.77* 09/26/2012   INR 1.21 09/21/2012   INR 1.25 09/13/2012    RADIOLOGY: Dg Chest 1 View  09/16/2012  *RADIOLOGY REPORT*  Clinical Data: Cough, chest pain.  CHEST - 1 VIEW   Comparison: 09/12/2012  Findings: There is cardiomegaly with vascular congestion.  Right base predominately linear opacities, likely atelectasis.  This is increased since prior study.  Increasing left base opacity which could represent atelectasis or infiltrate.  No definite effusion. No acute bony abnormality.  IMPRESSION: Increasing bibasilar opacities bilaterally.  Cardiomegaly, vascular congestion.   Original Report Authenticated By: Rolm Baptise, M.D.    Dg Chest 2 View  09/12/2012  *RADIOLOGY REPORT*  Clinical Data: Difficulty moving right arm and right leg for 4 days.  Circulatory problem.  CHEST - 2 VIEW  Comparison: 09/25/2008.  Findings: Cardiomegaly.  Low volume chest.  Basilar atelectasis. Gaseous distention of the colon is incidentally noted. There is no airspace consolidation.  IMPRESSION: Low volume chest with basilar atelectasis.  Cardiomegaly.   Original Report Authenticated By: Dereck Ligas, M.D.    Dg Wrist Complete Right  09/13/2012  *RADIOLOGY REPORT*  Clinical Data: Wrist pain and swelling, altered mental status  RIGHT WRIST - COMPLETE 3+ VIEW  Comparison: Right wrist films of 09/12/2012  Findings: No acute fracture is seen.  The radiocarpal joint space is minimally narrowed.  The carpal bones are  in normal position. The lucency involving the tip of the ulnar styloid is again noted and is of questionable significance.  Some deformity of the distal right fifth metacarpal neck probably is due to prior fracture.  IMPRESSION: No acute fracture.   Original Report Authenticated By: Ivar Drape, M.D.    Mr [facility] Wo Contrast  09/15/2012  *RADIOLOGY REPORT*  Clinical Data:  Altered mental status.  Pyelonephritis possibly with urosepsis.  MRI BRAIN WITHOUT CONTRAST MRA HEAD WITHOUT CONTRAST  Technique: Multiplanar, multiecho pulse sequences of the brain and surrounding structures were obtained according to standard protocol without intravenous contrast.  Angiographic images of the head  were obtained using MRA technique without contrast.  Comparison: 09/25/2008 head CT.  No comparison MR.  MRI HEAD  Findings:  Motion degraded exam.  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  Tiny small acute infarcts involving the posterior left temporal lobe and left parietal - occipital junction.  Tiny acute infarcts left cerebellum and right cerebellar tonsil.  This distribution of infarcts raises possibility of embolic disease.  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  No intracranial hemorrhage.  Mild small vessel disease type changes.  No intracranial mass lesion detected on this unenhanced exam.  No hydrocephalus.  Opacification in the right mastoid air cells and right middle ear. No obstructing lesion is noted in the region of the posterior- superior nasopharynx causing eustachian tube dysfunction.  No findings of thrombosis of the adjacent dural sinuses.  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  Tiny small acute infarcts involving the posterior left temporal lobe and left parietal - occipital junction.  Tiny acute infarcts left cerebellum and right cerebellar tonsil.  This distribution of infarcts raises possibility of embolic disease.  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  Opacification right mastoid air cells and right middle ear as noted above.  MRA HEAD  Findings: Anterior circulation without medium or large size vessel significant stenosis or occlusion.  Mild narrowing involving  portions of the A1 segment of the anterior cerebral artery bilaterally and the M1 segment of the middle cerebral artery bilaterally.  Mild branch vessel irregularity middle cerebral artery and anterior cerebral artery distribution.  Codominant vertebral arteries.  No high-grade stenosis of the distal vertebral arteries.  Very mild narrowing and irregularity mid aspect of the basilar artery.  Poor delineation AICAs.  Mild irregularity and narrowing involving portions of the superior cerebellar arteries.  No aneurysmal or vascular malformation noted.  IMPRESSION: Branch vessel atherosclerotic type changes as noted above.  MRI CERVICAL SPINE WITHOUT CONTRAST  Technique:  Multiplanar and multiecho pulse sequences of the cervical spine, to include the craniocervical junction and cervicothoracic junction, were obtained without intravenous contrast.  Comparison:   None  Findings:  Motion degraded exam.  Abnormal appearance of the C6-7 vertebra and disc space highly suspicious for a diskitis/osteomyelitis with abnormal appearance of the C6-7 and C7-T1 facet joints extending to involve the left T1 pedicle suspicious for involvement by infection.  Epidural abscess suspected although evaluation limited by motion and the fact that no  contrast was administered.  This contributes to spinal stenosis and mild cord flattening (most notable C6 level).  Extension of infectious process into the neural foramen on the right at the C5-6 and C6 level and on the left at the C5-6 through C7-T1 level.  Abnormal signal interspinous region most notable C5-6 level with extending from the C2 level to the C7 level.  Findings suspicious for spread of infection.  There is also edema within paraspinal musculature throughout the posterior neck consistent with cellulitis.  Slight increased signal within the C6 and C7 spinous process suspicious for involvement by by infection.  No definitive cervical cord signal abnormality noted on this motion degraded  exam.  Both vertebral arteries and carotid arteries are patent.  C2-3:  Bulge.  Facet joint degenerative changes.  Minimal uncinate hypertrophy.  C3-4:  Broad-based disc osteophyte complex.  Moderate spinal stenosis.  Mild cord flattening.  Moderate bilateral foraminal narrowing.  C4-5:  Bulge with tiny central protrusion.  Mild spinal stenosis. Minimal bilateral foraminal narrowing.  C5-6:  Bulge/broad-based protrusion.  Mild spinal stenosis.  Mild cord flattening.  Mild bilateral foraminal narrowing.  C6-7:  Diskitis as noted above.  C7-T1:  Facet joint degenerative changes.  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  Epidural abscess suspected although evaluation limited without contrast.  This contributes to spinal stenosis most prominent C6 level.  Cervical spondylotic changes most notable C3-4 level.  Please see above further detail.  Critical Value/emergent results were called by telephone at the time of interpretation on 09/15/2012 at 11:09 am to Dr. McDiarmid, who verbally acknowledged these results.   Original Report Authenticated By: Genia Del, M.D.    Mr Brain Wo Contrast  09/15/2012  *RADIOLOGY REPORT*  Clinical Data:  Altered mental status.  Pyelonephritis possibly with urosepsis.  MRI BRAIN WITHOUT CONTRAST MRA HEAD WITHOUT CONTRAST  Technique: Multiplanar, multiecho pulse sequences of the brain and surrounding structures were obtained according to standard protocol without intravenous contrast.  Angiographic images of the head were obtained using MRA technique without contrast.  Comparison: 09/25/2008 head CT.  No comparison MR.  MRI HEAD  Findings:  Motion degraded exam.  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  Tiny small acute infarcts involving the posterior left temporal lobe and left parietal - occipital junction.  Tiny acute infarcts left cerebellum and right cerebellar tonsil.  This distribution of infarcts raises possibility of embolic disease.  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  No intracranial hemorrhage.  Mild small vessel disease type changes.  No intracranial mass lesion detected on this unenhanced exam.  No hydrocephalus.  Opacification in the right mastoid air cells and right middle ear. No obstructing lesion is noted in the region of the posterior- superior nasopharynx causing eustachian tube dysfunction.  No findings of thrombosis of the adjacent dural sinuses.  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  Tiny small acute infarcts involving the posterior left temporal lobe and left parietal - occipital junction.  Tiny acute infarcts left cerebellum and right cerebellar tonsil.  This distribution  of infarcts raises possibility of embolic disease.  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  Opacification right mastoid air cells and right middle ear as noted above.  MRA HEAD  Findings: Anterior circulation without medium or large size vessel significant stenosis or occlusion.  Mild narrowing involving portions of the A1 segment of the anterior cerebral artery bilaterally and the M1 segment of the middle cerebral artery bilaterally.  Mild branch vessel irregularity middle cerebral artery and anterior cerebral artery distribution.  Codominant vertebral arteries.  No high-grade stenosis of the distal vertebral arteries.  Very mild  narrowing and irregularity mid aspect of the basilar artery.  Poor delineation AICAs.  Mild irregularity and narrowing involving portions of the superior cerebellar arteries.  No aneurysmal or vascular malformation noted.  IMPRESSION: Branch vessel atherosclerotic type changes as noted above.  MRI CERVICAL SPINE WITHOUT CONTRAST  Technique:  Multiplanar and multiecho pulse sequences of the cervical spine, to include the craniocervical junction and cervicothoracic junction, were obtained without intravenous contrast.  Comparison:   None  Findings:  Motion degraded exam.  Abnormal appearance of the C6-7 vertebra and disc space highly suspicious for a diskitis/osteomyelitis with abnormal appearance of the C6-7 and C7-T1 facet joints extending to involve the left T1 pedicle suspicious for involvement by infection.  Epidural abscess suspected although evaluation limited by motion and the fact that no contrast was administered.  This contributes to spinal stenosis and mild cord flattening (most notable C6 level).  Extension of infectious process into the neural foramen on the right at the C5-6 and C6 level and on the left at the C5-6 through C7-T1 level.  Abnormal signal interspinous region most notable C5-6 level with extending from the C2 level to the C7 level.  Findings suspicious for spread of infection.  There is also edema within paraspinal musculature throughout the posterior neck consistent with cellulitis.  Slight increased signal within the C6 and C7 spinous process suspicious for involvement by by infection.  No definitive cervical cord signal abnormality noted on this motion degraded exam.  Both vertebral arteries and carotid arteries are patent.  C2-3:  Bulge.  Facet joint degenerative changes.  Minimal uncinate hypertrophy.  C3-4:  Broad-based disc osteophyte complex.  Moderate spinal stenosis.  Mild cord flattening.  Moderate bilateral foraminal narrowing.  C4-5:  Bulge with tiny central protrusion.  Mild  spinal stenosis. Minimal bilateral foraminal narrowing.  C5-6:  Bulge/broad-based protrusion.  Mild spinal stenosis.  Mild cord flattening.  Mild bilateral foraminal narrowing.  C6-7:  Diskitis as noted above.  C7-T1:  Facet joint degenerative changes.  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  Epidural abscess suspected although evaluation limited without contrast.  This contributes to spinal stenosis most prominent C6 level.  Cervical spondylotic changes most notable C3-4 level.  Please see above further detail.  Critical Value/emergent results were called by telephone at the time of interpretation on 09/15/2012 at 11:09 am to Dr. McDiarmid, who verbally acknowledged these results.   Original Report Authenticated By: Genia Del, M.D.    Mr Cervical Spine Wo Contrast  09/15/2012  *RADIOLOGY REPORT*  Clinical Data:  Altered mental status.  Pyelonephritis possibly with urosepsis.  MRI BRAIN WITHOUT CONTRAST MRA HEAD WITHOUT CONTRAST  Technique: Multiplanar, multiecho pulse sequences of the brain and surrounding structures were obtained according to standard protocol without intravenous contrast.  Angiographic images of the head were obtained using MRA technique without contrast.  Comparison: 09/25/2008 head CT.  No comparison MR.  MRI HEAD  Findings:  Motion degraded exam.  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  Tiny small acute infarcts involving the posterior left temporal lobe and left parietal - occipital junction.  Tiny acute infarcts left cerebellum and right cerebellar tonsil.  This distribution of infarcts raises possibility of embolic disease.  As the patient has findings of diskitis of the  cervical spine as discussed below, septic emboli not entirely excluded.  No intracranial hemorrhage.  Mild small vessel disease type changes.  No intracranial mass lesion detected on this unenhanced exam.  No hydrocephalus.  Opacification in the right mastoid air cells and right middle ear. No obstructing lesion is noted in the region of the posterior- superior nasopharynx causing eustachian tube dysfunction.  No findings of thrombosis of the adjacent dural sinuses.  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  Tiny small acute infarcts involving the posterior left temporal lobe and left parietal - occipital junction.  Tiny acute infarcts left cerebellum and right cerebellar tonsil.  This distribution of infarcts raises possibility of embolic disease.  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  Opacification right mastoid air cells and right middle ear as noted above.  MRA HEAD  Findings: Anterior circulation without medium or large size vessel significant stenosis or occlusion.  Mild narrowing involving portions of the A1 segment of the anterior cerebral artery bilaterally and the M1 segment of the middle cerebral artery bilaterally.  Mild branch vessel irregularity middle cerebral artery and anterior cerebral artery distribution.  Codominant vertebral arteries.  No high-grade stenosis of the distal vertebral arteries.  Very mild narrowing and irregularity mid aspect of the basilar artery.  Poor delineation AICAs.  Mild irregularity and narrowing involving portions of the superior cerebellar arteries.  No aneurysmal or vascular malformation noted.  IMPRESSION: Branch vessel atherosclerotic type changes as noted above.  MRI CERVICAL SPINE WITHOUT CONTRAST   Technique:  Multiplanar and multiecho pulse sequences of the cervical spine, to include the craniocervical junction and cervicothoracic junction, were obtained without intravenous contrast.  Comparison:   None  Findings:  Motion degraded exam.  Abnormal appearance of the C6-7 vertebra and disc space highly suspicious for a diskitis/osteomyelitis with abnormal appearance of the C6-7 and C7-T1 facet joints extending to involve the left T1 pedicle suspicious for involvement by infection.  Epidural abscess suspected although evaluation limited by motion and the fact that no contrast was administered.  This contributes to spinal stenosis and mild cord flattening (most notable C6 level).  Extension of infectious process into the neural foramen on the right at the C5-6 and C6 level and on the left at the C5-6 through C7-T1 level.  Abnormal signal interspinous region most notable C5-6 level with extending from the C2 level to the C7 level.  Findings suspicious for spread of infection.  There is also edema within paraspinal musculature throughout the posterior neck consistent with cellulitis.  Slight increased signal within the C6 and C7 spinous process suspicious for involvement by by infection.  No definitive  cervical cord signal abnormality noted on this motion degraded exam.  Both vertebral arteries and carotid arteries are patent.  C2-3:  Bulge.  Facet joint degenerative changes.  Minimal uncinate hypertrophy.  C3-4:  Broad-based disc osteophyte complex.  Moderate spinal stenosis.  Mild cord flattening.  Moderate bilateral foraminal narrowing.  C4-5:  Bulge with tiny central protrusion.  Mild spinal stenosis. Minimal bilateral foraminal narrowing.  C5-6:  Bulge/broad-based protrusion.  Mild spinal stenosis.  Mild cord flattening.  Mild bilateral foraminal narrowing.  C6-7:  Diskitis as noted above.  C7-T1:  Facet joint degenerative changes.  IMPRESSION: C6-7 diskitis with spread of infection to the adjacent facet  joints, spinous process and interspinous ligaments/posterior paraspinal musculature as discussed above.  Epidural abscess suspected although evaluation limited without contrast.  This contributes to spinal stenosis most prominent C6 level.  Cervical spondylotic changes most notable C3-4 level.  Please see above further detail.  Critical Value/emergent results were called by telephone at the time of interpretation on 09/15/2012 at 11:09 am to Dr. McDiarmid, who verbally acknowledged these results.   Original Report Authenticated By: Genia Del, M.D.    Nm Pulmonary Perf And Vent  09/16/2012  *RADIOLOGY REPORT*  Clinical Data: Chest pain, cough.  NM PULMONARY VENTILATION AND PERFUSION SCAN  Radiopharmaceutical: 6MILLI CURIE MAA TECHNETIUM TO 6M ALBUMIN AGGREGATED , 41 MCI TECHNETIUM 6M DTPA.  Comparison: Chest x-ray performed today.  Findings: Minimal patchy nonsegmental perfusion defects noted.  No significant VQ mismatch to suggest pulmonary embolus.  IMPRESSION: Low probability study for pulmonary embolus.   Original Report Authenticated By: Rolm Baptise, M.D.    Dg Chest Port 1 View  09/29/2012  *RADIOLOGY REPORT*  Clinical Data: Postoperative for mitral valve replacement.  PORTABLE CHEST - 1 VIEW  Comparison: 09/28/2012  Findings: Prosthetic valve projects centrally over the cardiac shadow.  Left PICC line tip projects over the SVC.  The right IJ line, bilateral chest tubes, and mediastinal drain have been removed.  No pneumothorax identified.  Mild to moderate cardiomegaly noted.  Mild airspace opacity is noted in the left lower lobe.  There is linear subsegmental atelectasis at the right lung base.  IMPRESSION:  1.  Chest tubes and right IJ line have been removed.  No pneumothorax. 2.  Mild airspace opacity at the left lung base, probably atelectasis.  Subsegmental atelectasis at the right lung base. 3.  Cardiomegaly   Original Report Authenticated By: Van Clines, M.D.    Dg Chest Portable 1  View In Am  09/28/2012  *RADIOLOGY REPORT*  Clinical Data: Postop cardiac surgery, follow-up  PORTABLE CHEST - 1 VIEW  Comparison: Portable chest x-ray of 09/27/2012  Findings: There is little change in aeration with mild basilar volume loss present.  Cardiomegaly is stable.  A left chest tube remains and no definite left pneumothorax is seen.  Left PICC line and two right central venous lines are unchanged in position.  A right chest tube also is present with no pneumothorax.  IMPRESSION:  1.  No change in aeration with mild basilar atelectasis. 2.  Bilateral chest tubes remain with no pneumothorax. 3.  No change in position of central venous lines.   Original Report Authenticated By: Ivar Drape, M.D.    Dg Chest Port 1 View  09/27/2012  *RADIOLOGY REPORT*  Clinical Data: PICC placement.  PORTABLE CHEST - 1 VIEW  Comparison: Portable chest 09/27/2012 at 6:01 a.m.  Findings: The patient has a new left PICC with the tip projecting over the right atrium.  The catheter could be withdrawn 2.5-3 cm for better positioning.  Support tubes lines are otherwise unchanged.  Left basilar atelectasis and small effusion appear improved.  There is cardiomegaly.  No pneumothorax identified.  IMPRESSION:  1.  Tip of left PICC projects in the right atrium.  The catheter could be withdrawn 2.5- 3 cm. 2.  Improved left basilar atelectasis and effusion.   Original Report Authenticated By: Orlean Patten, M.D.    Dg Chest Portable 1 View In Am  09/27/2012  *RADIOLOGY REPORT*  Clinical Data: Postop mitral valve replacement.  PORTABLE CHEST - 1 VIEW  Comparison: 09/26/2012.  Findings: Interval extubation.  Nasogastric tube has been removed. Right IJ central line tip projects over the SVC.  Right IJ Swan- Ganz catheter tip projects over the right coronary artery. Bilateral chest tubes, mediastinal drains epicardial pacer wires remain in place. Left PICC tip projects over the SVC.  Sternotomy wires are unchanged in position.  Heart is  enlarged, stable.  Lungs are somewhat low in volume with mild bibasilar air space disease, left greater than right. Probable small left pleural effusion.  No definite pneumothorax.  IMPRESSION:  1.  Somewhat low lung volumes with bibasilar air space disease, left greater than right, and probable small left pleural effusion. 2.  No definite pneumothorax.   Original Report Authenticated By: Lorin Picket, M.D.    Dg Chest Portable 1 View  09/26/2012  *RADIOLOGY REPORT*  Clinical Data: 68 year old male - status post cardiac valve replacement.  PORTABLE CHEST - 1 VIEW  Comparison: 09/16/2012 and prior chest radiographs  Findings: Cardiomegaly and evidence of cardiac valve replacement noted. An endotracheal tube with tip 3.5 cm above the carina, a left PICC line with tip overlying the mid SVC, an NG tube entering the stomach, a right IJ Swan-Ganz catheter with tip overlying the right main pulmonary artery, a right IJ central venous cath with tip overlying the lower SVC, and mediastinal/right thoracostomy tubes. A left pleural effusion is noted as well as bibasilar atelectasis. There is no evidence of pneumothorax.  IMPRESSION: Postoperative changes with support apparatus as described.  Left pleural effusion and bibasilar atelectasis - no evidence of pneumothorax.   Original Report Authenticated By: Margarette Canada, M.D.    Dg Abd Portable 1v  09/18/2012  *RADIOLOGY REPORT*  Clinical Data: Intermittent abdominal pain.  PORTABLE ABDOMEN - 1 VIEW  Comparison: None.  Findings: Nonspecific bowel gas pattern with gas filled bowel predominately colon measuring up to 8 cm (distal transverse colon) with small bowel measuring up to 3 cm.  This may indicate an ileus. Result of underlying inflammatory process not entirely excluded.  The possibility of free intraperitoneal air cannot be addressed on a supine view.  Right hip joint degenerative changes.  Degenerative changes lumbar spine.  Cardiomegaly.  IMPRESSION: Nonspecific gas  pattern as discussed above.   Original Report Authenticated By: Genia Del, M.D.    Dg Foot Complete Right  09/14/2012  *RADIOLOGY REPORT*  Clinical Data: Pain  RIGHT FOOT COMPLETE - 3+ VIEW  Comparison: Yesterday  Findings: A BB has been utilized to mark the scan location of pain. There is corresponds to the area of skin just dorsal to the the wire is transfixed in the distal first metatarsal.  Stable hardware compared with yesterday.  No new fracture.  Degenerative changes at the first metatarsophalangeal joint.  Central erosions involving the first metatarsal head are stable.  No aggressive periosteal reaction.  IMPRESSION: A skin BB marks the location of pain overlying  the hardware in the distal first metatarsal.  Stable degenerative and postoperative changes.   Original Report Authenticated By: Marybelle Killings, M.D.    Dg Foot Complete Right  09/13/2012  *RADIOLOGY REPORT*  Clinical Data: Ulcer  RIGHT FOOT COMPLETE - 3+ VIEW  Comparison: None.  Findings: Three views of the right foot submitted.  Study is limited by diffuse osteopenia.  No acute fracture or subluxation. Postsurgical metallic fixation nails are noted distal aspect first metatarsal.  No periosteal reaction or bony erosion.  Small plantar spur of the calcaneus.  IMPRESSION: No acute fracture or subluxation.  No periosteal reaction or bony erosion.  Diffuse osteopenia.  Postsurgical changes distal aspect first metatarsal.   Original Report Authenticated By: Lahoma Crocker, M.D.    ASSESSMENT/PLAN: 1.  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  Fibrinous pericarditis 3.  Atrial fibrillation now in NSR with first degree AV block on Amiodarone/Lopressor/IV Heparin 4.  HTN controlled on amlodipine/Lopressor 5.  Post op anemia - may need transfusion 6.  Bilateral DVT on IV Heparin    Sueanne Margarita, MD  09/30/2012  9:33 AM

## 2012-09-30 NOTE — Progress Notes (Signed)
CARDIAC REHAB PHASE I   PRE:  Rate/Rhythm: 92 SR  BP:  Supine: 131/69 Sitting:   Standing:    SaO2: 98% RA  MODE:  Ambulation: 80 ft   POST:  Rate/Rhythm: 116 ST  BP:  Supine: 128/56 Sitting:   Standing:    SaO2: 95% RA  1021-1111 Pt tolerated ambulation fair with assist x 2, pushing rolling walker with towel under right forearm for support, and NT pushing chair behind. Multiple seated rest breaks taken, pt c/o fatigue, SOB, leg weakness. Pt increased distance. To bed after walk. Pt very appreciative of staff assistance with walk. Ryan [name]

## 2012-09-30 NOTE — Progress Notes (Signed)
Family Medicine Teaching Service Daily Progress Note Service Page: [phone]  Patient Assessment: 67 y.o. year old male with acute encephalopathy (now resolved) secondary to sepsis from multiple sources, s/p I&D of R wrist and R MTP lesion (with HW removal), Multiple small R hemisphere infarcts that are likely septic emboli, C6-T1 Diskitis, pericarditis,  Endocarditis (s/p MVR), and E. Coli UTI (s/p treatment). Now s/p mitral valve replacement on 1/7.  Subjective:  Pt states that he feels ok this morning. Having some chest pain with deep breath. Also having neck and R hand pain that's worse than previous. No dyspnea currently, but having some intermittently.   Objective: Temp:  [97.1 F (36.2 C)-98 F (36.7 C)] 97.9 F (36.6 C) (01/11 0518) Pulse Rate:  [87-97] 88  (01/11 0518) Resp:  [16-28] 19  (01/11 0518) BP: (110-131)/(55-70) 114/59 mmHg (01/11 0518) SpO2:  [93 %-100 %] 96 % (01/11 0518) Weight:  [193 lb 3.2 oz (87.635 kg)] 193 lb 3.2 oz (87.635 kg) (01/11 0106)  Exam: Gen: NAD, alert, cooperative with exam, sitting in chair, tired appearing; clean dry intact (CDI) bandage on R  Neck where swan was removed, CDI banadge in place on L lower chest where chest tube was removed., CDI bandage on abdomen, Sternal incision clean dry and intact HEENT: NCAT, EOMI, MMM Neck: no tenderness to pal[pation of  CV: RRR, good S1/S2 with additional S3 Resp: CTABL, no wheezes, non-labored Abd: Soft, diffuse mild tenderness, BS+ Ext: 1+ pitting edema BL LE, warm; dressing in place to right wrist/hand, left wrist, and right food, clean/dry/intact Neuro: Alert, oriented/appropriate in conversation, face symmetric;  speaks quietly and some slowed mentation  I have reviewed the patient's medications, labs, imaging, and diagnostic testing.  Notable results are summarized below.  CBC BMET   Lab 09/30/12 0511 09/29/12 0510 09/28/12 0356  WBC 13.9* 14.0* 19.8*  HGB 7.1* 8.1* 7.9*  HCT 21.3* 23.9* 23.1*    PLT 311 283 288    Lab 09/30/12 0511 09/29/12 0510 09/28/12 0356  NA 137 135 133*  K 4.0 4.0 4.9  CL 102 101 101  CO2 26 25 24   BUN 20 27* 22  CREATININE 0.84 1.02 1.11  GLUCOSE 81 101* 135*  CALCIUM 7.9* 8.1* 8.2*    Blood Culture 12/26- No growth- final Blood culture 12/30- No growth- final C. Diff 1/4 - negative  Imaging/Diagnostic Tests: TEE 09/18/2012 - The estimated ejection fraction was in the range of 60% to 65%. - Aortic valve: No evidence of vegetation. - Mitral valve: Large 2.5cm diameter vegetation/ - Left atrium: No evidence of thrombus in the atrial cavity or appendage. - Right atrium: No evidence of thrombus in the atrial cavity or appendage. - Tricuspid valve: No evidence of vegetation. - Pulmonic valve: No evidence of vegetation. - Pericardium, extracardiac: A small, free-flowing pericardial effusion was identified along the right atrial free wall.  MRI Head 12/27 Several small areas of acute infarction involving the right  hemisphere including portions of the; right occipital lobe, right  temporal lobe, right opercular region, posterior right frontal lobe  and right parietal lobe.  MRI Cervical Spine 12/27 C6-7 diskitis with spread of infection to the adjacent facet  joints, spinous process and interspinous ligaments/posterior  paraspinal musculature as discussed above. Epidural abscess  suspected although evaluation limited without contrast.  CXR 09/26/2012 IMPRESSION:  Postoperative changes with support apparatus as described.  Left pleural effusion and bibasilar atelectasis - no evidence of  pneumothorax.  CXR 09/27/2012 IMPRESSION:  1. Somewhat low  lung volumes with bibasilar air space disease,  left greater than right, and probable small left pleural effusion.  2. No definite pneumothorax.  Venous duplex 09/28/2012 Summary: Findings consistent with deep vein thrombosis involving the right mid femoral vein (behind a valve leaflet), the  right gastrocnemius vein and the left gastrocnemius vein.   Plan: Ryan [name] is a 67 y.o. year old male with acute encephalopathy secondary to sepsis from multiple sources.  Now post-op from mitral valve replacement on 09/26/2012.   CV- Stable off pressors - s/p Mitral valve replacement day 4 - Cardiology and CTS on board, greatly appreciate recommendations - Lasix restart per cards/CTS, 40 IV BID; Cre trend last three days: 0.9 to 1.11 to 1.02 to 0.84 today.  - Heart cath on 09/21/2012, Findings:  - Widely patent coronary arteries, pulmonary HTN with PCWP of 67mmHg  - Post-op pain- PRN morphine and oxycodone   Afib with RVR- Pericarditis,  - Afib likely due to pericarditis, pericarditis improving - causing Afib, currently rate controlled with amiodarone - continue per cardiology - TEE on 12/30 confirmed MV vegetation listed below and slight improvement of pericarditis  Acute Diastolic Heart failure - will likely improve s/p MVR (done 1/7) - secondary to mitral regurgitation caused by large vegetation now replaced - Lasix restarted per cards/CTS, as above - Strict I/O, daily weights - UOP 620 mL, Positive 2.8 L for the admission.   DVT - BL DVT likely from bedbound state since admission - Heparin drip per Dr. Cyndia Bent  - Hold on transitioning to PO anticoagulation due to probable need for vascular surgery next Tuesday (1/14) per Dr. Stephens Shire request  - monitor  Resp-  - stable on RA, some tachypnea during the day yesterday, resolved now - no complaints of dyspnea currently  - CXR with Small L pleural effusion, chest tube placed by CTS 1/8 and removed 1/9  ID - metastatic infection - ID on Board-   - Continue Ancef, likely will need 6-8 weeks  - per Dr. 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  - R ABI 0.48 on 1/2/214, L ABI WNL - Vascular plans to perform LE angio on 1/14 and intervene as necessary  Acute Kidney Injury.  - Resolved, follow daily BMET - mild  increase of Cr 1/8 - 1/9, will monitor daily  Elevated Transaminases -  - Pt AST:ALT 2:1 consistent with alcoholic picture. EtOH on admission < 11  - consider recheck LFT's  Newly Dx DM II - A1C 6.5 on check  - Insulin drip 1/8, transitioned to subq insulin - CBG's in the low 100's - Consider sensitive SSI if starts to develop hyperglycemia, Blood glucose fine for now.   FEN/GI:  - Carb modified diet  - KVO NS - Strict I/O  Prophylaxis: SCD Disposition: Step down for now, CIR vs SNF with PT after workup.   Ryan File, MD 09/30/2012, 8:20 AM

## 2012-10-01 LAB — TYPE AND SCREEN
ABO/RH(D): A POS
Antibody Screen: NEGATIVE
Unit division: 0
Unit division: 0

## 2012-10-01 LAB — BASIC METABOLIC PANEL
BUN: 14 mg/dL (ref 6–23)
CO2: 24 mEq/L (ref 19–32)
Calcium: 8.1 mg/dL — ABNORMAL LOW (ref 8.4–10.5)
Chloride: 100 mEq/L (ref 96–112)
Creatinine, Ser: 0.79 mg/dL (ref 0.50–1.35)
GFR calc Af Amer: >90 mL/min (ref >90)
GFR calc non Af Amer: >90 mL/min (ref >90)
Glucose, Bld: 95 mg/dL (ref 70–99)
Potassium: 3.7 mEq/L (ref 3.5–5.1)
Sodium: 136 mEq/L (ref 135–145)

## 2012-10-01 LAB — OCCULT BLOOD X 1 CARD TO LAB, STOOL
Fecal Occult Bld: NEGATIVE
Fecal Occult Bld: NEGATIVE

## 2012-10-01 LAB — CBC
HCT: 25.7 % — ABNORMAL LOW (ref 39.0–52.0)
Hemoglobin: 8.5 g/dL — ABNORMAL LOW (ref 13.0–17.0)
MCH: 24.1 pg — ABNORMAL LOW (ref 26.0–34.0)
MCHC: 33.1 g/dL (ref 30.0–36.0)
MCV: 73 fL — ABNORMAL LOW (ref 78.0–100.0)
Platelets: 274 10*3/uL (ref 150–400)
RBC: 3.52 MIL/uL — ABNORMAL LOW (ref 4.22–5.81)
RDW: 21.1 % — ABNORMAL HIGH (ref 11.5–15.5)
WBC: 11.7 10*3/uL — ABNORMAL HIGH (ref 4.0–10.5)

## 2012-10-01 LAB — GLUCOSE, CAPILLARY
Glucose-Capillary: 135 mg/dL — ABNORMAL HIGH (ref 70–99)
Glucose-Capillary: 113 mg/dL — ABNORMAL HIGH (ref 70–99)
Glucose-Capillary: 119 mg/dL — ABNORMAL HIGH (ref 70–99)
Glucose-Capillary: 90 mg/dL (ref 70–99)

## 2012-10-01 LAB — HEPARIN LEVEL (UNFRACTIONATED)
Heparin Unfractionated: 0.21 IU/mL — ABNORMAL LOW (ref 0.30–0.70)
Heparin Unfractionated: 0.22 IU/mL — ABNORMAL LOW (ref 0.30–0.70)
Heparin Unfractionated: 0.47 IU/mL (ref 0.30–0.70)

## 2012-10-01 MED ORDER — FUROSEMIDE 10 MG/ML IJ SOLN
INTRAMUSCULAR | Status: AC
  Administered 2012-10-01: 20 mg via INTRAVENOUS
  Filled 2012-10-01: qty 4

## 2012-10-01 NOTE — Progress Notes (Signed)
ANTICOAGULATION CONSULT NOTE - Follow Up Consult  Pharmacy Consult for heparin Indication: DVT  No Known Allergies  Patient Measurements: Height: 6' (182.9 cm) Weight: 198 lb 8 oz (90.039 kg) IBW/kg (Calculated) : 77.6  Heparin Dosing Weight: 87kg  Vital Signs: Temp: 98.9 F (37.2 C) (01/12 0434) Temp src: Oral (01/12 0434) BP: 129/68 mmHg (01/12 0434) Pulse Rate: 86  (01/12 0434)  Labs:  Basename 10/01/12 0655 09/30/12 0516 09/30/12 0511 09/29/12 2133 09/29/12 0510  HGB 8.5* -- 7.1* -- --  HCT 25.7* -- 21.3* -- 23.9*  PLT 274 -- 311 -- 283  APTT -- -- -- -- --  LABPROT -- -- -- -- --  INR -- -- -- -- --  HEPARINUNFRC 0.21* 0.60 -- 0.51 --  CREATININE 0.79 -- 0.84 -- 1.02  CKTOTAL -- -- -- -- --  CKMB -- -- -- -- --  TROPONINI -- -- -- -- --    Estimated Creatinine Clearance: 97 ml/min (by C-G formula based on Cr of 0.79).   Assessment: Patient is a 67 y.o M on heparin for DVT with plan for angiogram next week.  Holding off on starting coumadin for now until after procedure.  His heparin was off from 1900 1/11 until 0330 1/12 for blood transfusion.  The heparin level drawn at 0655 is not at steady state.  Goal of Therapy:  Heparin level 0.3-0.7 units/ml Monitor platelets by anticoagulation protocol: Yes   Plan:  1) continue current heparin regimen 2) check Heparin level at 12noon today  Legrand Como, Pharm.D., BCPS Clinical Pharmacist  Phone [phone] Pager [phone] 10/01/2012, 8:50 AM

## 2012-10-01 NOTE — Progress Notes (Signed)
Family Medicine Teaching Service Daily Progress Note Service Page: [phone]  Patient Assessment: 67 y.o. year old male with acute encephalopathy (now resolved) secondary to sepsis from multiple sources, s/p I&D of R wrist and R MTP lesion (with HW removal), Multiple small R hemisphere infarcts that are likely septic emboli, C6-T1 Diskitis, pericarditis,  Endocarditis (s/p MVR), and E. Coli UTI (s/p treatment). Now s/p mitral valve replacement on 1/7.  Subjective:  Feeling Ok this morning, not complaining of any neck pain today. Pain in general improved today over yesterday. Some chest pain from incision site, no complaints of dyspnea. Tolerating food well.  Objective: Temp:  [97.7 F (36.5 C)-99.1 F (37.3 C)] 98.9 F (37.2 C) (01/12 0434) Pulse Rate:  [73-94] 86  (01/12 0434) Resp:  [16-20] 20  (01/12 0434) BP: (111-132)/(55-71) 129/68 mmHg (01/12 0434) SpO2:  [95 %-98 %] 97 % (01/12 0434) Weight:  [198 lb 8 oz (90.039 kg)] 198 lb 8 oz (90.039 kg) (01/12 0434)  Exam: Gen: NAD, alert, cooperative with exam, sleeping comfortably in bed HEENT: NCAT, EOMI, MMM Neck: no tenderness to palpation of Cervical spine, improved ROM compared to 1/11  CV: RRR, good S1/S2 with additional S3 Resp: CTABL, no wheezes, non-labored Abd: Soft, NTND, + BS Ext: 1+ pitting edema BL LE, warm; dressing in place to right wrist/hand and right food-clean/dry/intact Neuro: Alert, oriented/appropriate in conversation, face symmetric;  Speaks easily  I have reviewed the patient's medications, labs, imaging, and diagnostic testing.  Notable results are summarized below.  CBC BMET   Lab 10/01/12 0655 09/30/12 0511 09/29/12 0510  WBC 11.7* 13.9* 14.0*  HGB 8.5* 7.1* 8.1*  HCT 25.7* 21.3* 23.9*  PLT 274 311 283    Lab 09/30/12 0511 09/29/12 0510 09/28/12 0356  NA 137 135 133*  K 4.0 4.0 4.9  CL 102 101 101  CO2 26 25 24   BUN 20 27* 22  CREATININE 0.84 1.02 1.11  GLUCOSE 81 101* 135*  CALCIUM 7.9* 8.1*  8.2*    Blood Culture 12/26- No growth- final Blood culture 12/30- No growth- final C. Diff 1/4 - negative  Imaging/Diagnostic Tests: TEE 09/18/2012 - The estimated ejection fraction was in the range of 60% to 65%. - Aortic valve: No evidence of vegetation. - Mitral valve: Large 2.5cm diameter vegetation - Left atrium: No evidence of thrombus in the atrial cavity or appendage. - Right atrium: No evidence of thrombus in the atrial cavity or appendage. - Tricuspid valve: No evidence of vegetation. - Pulmonic valve: No evidence of vegetation. - Pericardium, extracardiac: A small, free-flowing pericardial effusion was identified along the right atrial free wall.  MRI Head 12/27 Several small areas of acute infarction involving the right  hemisphere including portions of the; right occipital lobe, right  temporal lobe, right opercular region, posterior right frontal lobe  and right parietal lobe.  MRI Cervical Spine 12/27 C6-7 diskitis with spread of infection to the adjacent facet  joints, spinous process and interspinous ligaments/posterior  paraspinal musculature as discussed above. Epidural abscess  suspected although evaluation limited without contrast.  CXR 09/26/2012 IMPRESSION:  Postoperative changes with support apparatus as described.  Left pleural effusion and bibasilar atelectasis - no evidence of  pneumothorax.  CXR 09/27/2012 IMPRESSION:  1. Somewhat low lung volumes with bibasilar air space disease,  left greater than right, and probable small left pleural effusion.  2. No definite pneumothorax.  Venous duplex 09/28/2012 Summary: Findings consistent with deep vein thrombosis involving the right mid femoral vein (behind a  valve leaflet), the right gastrocnemius vein and the left gastrocnemius vein.   Plan: Ryan [name] is a 67 y.o. year old male with acute encephalopathy secondary to sepsis from multiple sources.  Now post-op from mitral valve replacement on  09/26/2012.   CV- Stable off pressors - s/p Mitral valve replacement day 5 - Cardiology and CTS on board, greatly appreciate recommendations - Lasix per cards/CTS, 20 IV this am - Heart cath on 09/21/2012, Findings:  - Widely patent coronary arteries, pulmonary HTN with PCWP of 53mmHg  - Post-op pain- PRN oxycodone and tramadol   Afib with RVR- Pericarditis - Afib likely due to pericarditis, pericarditis improving - causing Afib, currently in NSR  with amiodarone - continue per cardiology - TEE on 12/30 confirmed MV vegetation listed below and slight improvement of pericarditis  Acute Diastolic Heart failure - will likely improve s/p MVR (done 1/7) - secondary to mitral regurgitation caused by large vegetation now replaced - Lasix restarted per cards/CTS, as above, 20 mg once today - Strict I/O, daily weights - UOP 1800 mL, Positive 2.6 L for the admission.   Post-op Anemia - Hgb dropped to 7.1 on 1/11 -Transfused two units, Hgb 8.5 this am - fecal occult blood negative  DVT - BL DVT likely from bedbound state since admission - Heparin drip per Dr. Cyndia Bent  - Hold on transitioning to PO anticoagulation due to probable need for vascular surgery next Tuesday (1/14) per Dr. Stephens Shire request  - monitor  Resp-  - stable on RA, some tachypnea during the day yesterday, resolved now - no complaints of dyspnea currently  - CXR with Small L pleural effusion, chest tube placed by CTS 1/8 and removed 1/9  ID - metastatic infection - ID on Board-   - Continue Ancef, likely will need 6-8 weeks  - per Dr. 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  - R ABI 0.48 on 1/2/214, L ABI WNL - Vascular plans to perform LE angio on 1/14 and intervene as necessary  Acute Kidney Injury.  - Resolved, follow daily BMET - mild increase of Cr 1/8 - 1/9, will monitor daily  Elevated Transaminases -  - Pt AST:ALT 2:1 consistent with alcoholic picture. EtOH on admission < 11  - consider  recheck LFT's  Newly Dx DM II - A1C 6.5 on check  - Insulin drip 1/8, transitioned to subq insulin - CBG's 93-135 - Consider sensitive SSI if starts to develop hyperglycemia, Blood glucose fine for now.   FEN/GI:  - Carb modified diet  - KVO NS - Strict I/O  Prophylaxis: SCD Disposition: Step down for now, CIR vs SNF with PT after workup.   Kenn File, MD 10/01/2012, 7:31 AM

## 2012-10-01 NOTE — Progress Notes (Signed)
Pt refused to get up to chair at this time. Will try again later. Will continue to monitor.

## 2012-10-01 NOTE — Progress Notes (Signed)
SUBJECTIVE:  Doing well , no significant complaints  OBJECTIVE:   Vitals:   Filed Vitals:   10/01/12 0031 10/01/12 0131 10/01/12 0215 10/01/12 0434  BP: 119/64 124/63 130/71 129/68  Pulse: 82 83 83 86  Temp: 99 F (37.2 C) 98.7 F (37.1 C) 98.6 F (37 C) 98.9 F (37.2 C)  TempSrc: Oral Oral Oral Oral  Resp: 18 16 18 20   Height:      Weight:    90.039 kg (198 lb 8 oz)  SpO2: 95%   97%   I&O's:   Intake/Output Summary (Last 24 hours) at 10/01/12 1003 Last data filed at 10/01/12 0745  Gross per 24 hour  Intake 1513.34 ml  Output   1803 ml  Net -289.66 ml   TELEMETRY: Reviewed telemetry pt in NSR:     PHYSICAL EXAM General: Well developed, well nourished, in no acute distress Head: Eyes PERRLA, No xanthomas.   Normal cephalic and atramatic  Lungs:   Clear bilaterally to auscultation and percussion. Heart:   HRRR S1 S2 Pulses are 2+ & equal. Abdomen: Bowel sounds are positive, abdomen soft and non-tender without masses  Extremities:   Right arm swollen Neuro: Alert and oriented X 3. Psych:  Good affect, responds appropriately   LABS: Basic Metabolic Panel:  Basename 10/01/12 0655 09/30/12 0511  NA 136 137  K 3.7 4.0  CL 100 102  CO2 24 26  GLUCOSE 95 81  BUN 14 20  CREATININE 0.79 0.84  CALCIUM 8.1* 7.9*  MG -- --  PHOS -- --   Liver Function Tests: No results found for this basename: AST:2,ALT:2,ALKPHOS:2,BILITOT:2,PROT:2,ALBUMIN:2 in the last 72 hours No results found for this basename: LIPASE:2,AMYLASE:2 in the last 72 hours CBC:  Basename 10/01/12 0655 09/30/12 0511  WBC 11.7* 13.9*  NEUTROABS -- --  HGB 8.5* 7.1*  HCT 25.7* 21.3*  MCV 73.0* 71.0*  PLT 274 311   Cardiac Enzymes: No results found for this basename: CKTOTAL:3,CKMB:3,CKMBINDEX:3,TROPONINI:3 in the last 72 hours BNP: No components found with this basename: POCBNP:3 D-Dimer: No results found for this basename: DDIMER:2 in the last 72 hours Hemoglobin A1C: No results found for  this basename: HGBA1C in the last 72 hours Fasting Lipid Panel: No results found for this basename: CHOL,HDL,LDLCALC,TRIG,CHOLHDL,LDLDIRECT in the last 72 hours Thyroid Function Tests: No results found for this basename: TSH,T4TOTAL,FREET3,T3FREE,THYROIDAB in the last 72 hours Anemia Panel: No results found for this basename: VITAMINB12,FOLATE,FERRITIN,TIBC,IRON,RETICCTPCT in the last 72 hours Coag Panel:   Lab Results  Component Value Date   INR 1.77* 09/26/2012   INR 1.21 09/21/2012   INR 1.25 09/13/2012    RADIOLOGY: Dg Chest 1 View  09/16/2012  *RADIOLOGY REPORT*  Clinical Data: Cough, chest pain.  CHEST - 1 VIEW  Comparison: 09/12/2012  Findings: There is cardiomegaly with vascular congestion.  Right base predominately linear opacities, likely atelectasis.  This is increased since prior study.  Increasing left base opacity which could represent atelectasis or infiltrate.  No definite effusion. No acute bony abnormality.  IMPRESSION: Increasing bibasilar opacities bilaterally.  Cardiomegaly, vascular congestion.   Original Report Authenticated By: Rolm Baptise, M.D.    Dg Chest 2 View  09/12/2012  *RADIOLOGY REPORT*  Clinical Data: Difficulty moving right arm and right leg for 4 days.  Circulatory problem.  CHEST - 2 VIEW  Comparison: 09/25/2008.  Findings: Cardiomegaly.  Low volume chest.  Basilar atelectasis. Gaseous distention of the colon is incidentally noted. There is no airspace consolidation.  IMPRESSION: Low volume chest  with basilar atelectasis.  Cardiomegaly.   Original Report Authenticated By: Dereck Ligas, M.D.    Dg Wrist Complete Right  09/13/2012  *RADIOLOGY REPORT*  Clinical Data: Wrist pain and swelling, altered mental status  RIGHT WRIST - COMPLETE 3+ VIEW  Comparison: Right wrist films of 09/12/2012  Findings: No acute fracture is seen.  The radiocarpal joint space is minimally narrowed.  The carpal bones are in normal position. The lucency involving the tip of the ulnar  styloid is again noted and is of questionable significance.  Some deformity of the distal right fifth metacarpal neck probably is due to prior fracture.  IMPRESSION: No acute fracture.   Original Report Authenticated By: Ivar Drape, M.D.    Mr [facility] Wo Contrast  09/15/2012  *RADIOLOGY REPORT*  Clinical Data:  Altered mental status.  Pyelonephritis possibly with urosepsis.  MRI BRAIN WITHOUT CONTRAST MRA HEAD WITHOUT CONTRAST  Technique: Multiplanar, multiecho pulse sequences of the brain and surrounding structures were obtained according to standard protocol without intravenous contrast.  Angiographic images of the head were obtained using MRA technique without contrast.  Comparison: 09/25/2008 head CT.  No comparison MR.  MRI HEAD  Findings:  Motion degraded exam.  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  Tiny small acute infarcts involving the posterior left temporal lobe and left parietal - occipital junction.  Tiny acute infarcts left cerebellum and right cerebellar tonsil.  This distribution of infarcts raises possibility of embolic disease.  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  No intracranial hemorrhage.  Mild small vessel disease type changes.  No intracranial mass lesion detected on this unenhanced exam.  No hydrocephalus.  Opacification in the right mastoid air cells and right middle ear. No obstructing lesion is noted in the region of the posterior- superior nasopharynx causing eustachian tube dysfunction.  No findings of thrombosis of the adjacent dural sinuses.  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  Tiny small acute infarcts involving the posterior left temporal lobe and left parietal - occipital junction.  Tiny acute infarcts left cerebellum and right cerebellar tonsil.  This distribution of infarcts raises possibility of embolic disease.  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  Opacification right mastoid air cells and right middle ear as noted above.  MRA HEAD  Findings: Anterior circulation without medium or large size vessel significant stenosis or occlusion.  Mild narrowing involving portions of the A1 segment of the anterior cerebral artery bilaterally and the M1 segment of the middle cerebral artery bilaterally.  Mild branch vessel irregularity middle cerebral artery and anterior cerebral artery distribution.  Codominant vertebral arteries.  No high-grade stenosis of the distal vertebral arteries.  Very mild narrowing and irregularity mid aspect of the basilar artery.  Poor delineation AICAs.  Mild irregularity and narrowing involving portions of the superior cerebellar arteries.  No aneurysmal or vascular malformation noted.  IMPRESSION: Branch vessel atherosclerotic type changes as noted above.  MRI CERVICAL SPINE WITHOUT CONTRAST  Technique:  Multiplanar and multiecho pulse sequences of the cervical spine, to include the craniocervical junction  and cervicothoracic junction, were obtained without intravenous contrast.  Comparison:   None  Findings:  Motion degraded exam.  Abnormal appearance of the C6-7 vertebra and disc space highly suspicious for a diskitis/osteomyelitis with abnormal appearance of the C6-7 and C7-T1 facet joints extending to involve the left T1 pedicle suspicious for involvement by infection.  Epidural abscess suspected although evaluation limited by motion and the fact that no contrast was administered.  This contributes to spinal stenosis and mild  cord flattening (most notable C6 level).  Extension of infectious process into the neural foramen on the right at the C5-6 and C6 level and on the left at the C5-6 through C7-T1 level.  Abnormal signal interspinous region most notable C5-6 level with extending from the C2 level to the C7 level.  Findings suspicious for spread of infection.  There is also edema within paraspinal musculature throughout the posterior neck consistent with cellulitis.  Slight increased signal within the C6 and C7 spinous process suspicious for involvement by by infection.  No definitive cervical cord signal abnormality noted on this motion degraded exam.  Both vertebral arteries and carotid arteries are patent.  C2-3:  Bulge.  Facet joint degenerative changes.  Minimal uncinate hypertrophy.  C3-4:  Broad-based disc osteophyte complex.  Moderate spinal stenosis.  Mild cord flattening.  Moderate bilateral foraminal narrowing.  C4-5:  Bulge with tiny central protrusion.  Mild spinal stenosis. Minimal bilateral foraminal narrowing.  C5-6:  Bulge/broad-based protrusion.  Mild spinal stenosis.  Mild cord flattening.  Mild bilateral foraminal narrowing.  C6-7:  Diskitis as noted above.  C7-T1:  Facet joint degenerative changes.  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  Epidural abscess suspected although evaluation limited without contrast.  This contributes to spinal stenosis most prominent C6 level.  Cervical spondylotic changes most notable C3-4 level.  Please see above further detail.  Critical Value/emergent results were called by telephone at the time of interpretation on 09/15/2012 at 11:09 am to Dr. McDiarmid, who verbally acknowledged these results.   Original Report Authenticated By: Genia Del, M.D.    Mr Brain Wo Contrast  09/15/2012  *RADIOLOGY REPORT*  Clinical Data:  Altered mental status.   Pyelonephritis possibly with urosepsis.  MRI BRAIN WITHOUT CONTRAST MRA HEAD WITHOUT CONTRAST  Technique: Multiplanar, multiecho pulse sequences of the brain and surrounding structures were obtained according to standard protocol without intravenous contrast.  Angiographic images of the head were obtained using MRA technique without contrast.  Comparison: 09/25/2008 head CT.  No comparison MR.  MRI HEAD  Findings:  Motion degraded exam.  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  Tiny small acute infarcts involving the posterior left temporal lobe and left parietal - occipital junction.  Tiny acute infarcts left cerebellum and right cerebellar tonsil.  This distribution of infarcts raises possibility of embolic disease.  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  No intracranial hemorrhage.  Mild small vessel disease type changes.  No intracranial mass lesion detected on this unenhanced exam.  No hydrocephalus.  Opacification in the right mastoid air cells and right middle ear. No obstructing lesion is noted in the region of the posterior- superior nasopharynx causing eustachian tube dysfunction.  No findings of  thrombosis of the adjacent dural sinuses.  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  Tiny small acute infarcts involving the posterior left temporal lobe and left parietal - occipital junction.  Tiny acute infarcts left cerebellum and right cerebellar tonsil.  This distribution of infarcts raises possibility of embolic disease.  As the patient has findings of diskitis of  the cervical spine as discussed below, septic emboli not entirely excluded.  Opacification right mastoid air cells and right middle ear as noted above.  MRA HEAD  Findings: Anterior circulation without medium or large size vessel significant stenosis or occlusion.  Mild narrowing involving portions of the A1 segment of the anterior cerebral artery bilaterally and the M1 segment of the middle cerebral artery bilaterally.  Mild branch vessel irregularity middle cerebral artery and anterior cerebral artery distribution.  Codominant vertebral arteries.  No high-grade stenosis of the distal vertebral arteries.  Very mild narrowing and irregularity mid aspect of the basilar artery.  Poor delineation AICAs.  Mild irregularity and narrowing involving portions of the superior cerebellar arteries.  No aneurysmal or vascular malformation noted.  IMPRESSION: Branch vessel atherosclerotic type changes as noted above.  MRI CERVICAL SPINE WITHOUT CONTRAST  Technique:  Multiplanar and multiecho pulse sequences of the cervical spine, to include the craniocervical junction and cervicothoracic junction, were obtained without intravenous contrast.  Comparison:   None  Findings:  Motion degraded exam.  Abnormal appearance of the C6-7 vertebra and disc space highly suspicious for a diskitis/osteomyelitis with abnormal appearance of the C6-7 and C7-T1 facet joints extending to involve the left T1 pedicle suspicious for involvement by infection.  Epidural abscess suspected although evaluation limited by motion and the fact that no contrast was administered.  This contributes to spinal stenosis and mild cord flattening (most notable C6 level).  Extension of infectious process into the neural foramen on the right at the C5-6 and C6 level and on the left at the C5-6 through C7-T1 level.  Abnormal signal interspinous region most notable C5-6 level with extending from the C2 level to the C7 level.  Findings suspicious for spread of infection.   There is also edema within paraspinal musculature throughout the posterior neck consistent with cellulitis.  Slight increased signal within the C6 and C7 spinous process suspicious for involvement by by infection.  No definitive cervical cord signal abnormality noted on this motion degraded exam.  Both vertebral arteries and carotid arteries are patent.  C2-3:  Bulge.  Facet joint degenerative changes.  Minimal uncinate hypertrophy.  C3-4:  Broad-based disc osteophyte complex.  Moderate spinal stenosis.  Mild cord flattening.  Moderate bilateral foraminal narrowing.  C4-5:  Bulge with tiny central protrusion.  Mild spinal stenosis. Minimal bilateral foraminal narrowing.  C5-6:  Bulge/broad-based protrusion.  Mild spinal stenosis.  Mild cord flattening.  Mild bilateral foraminal narrowing.  C6-7:  Diskitis as noted above.  C7-T1:  Facet joint degenerative changes.  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  Epidural abscess suspected although evaluation limited without contrast.  This contributes to spinal stenosis most prominent C6 level.  Cervical spondylotic changes most notable C3-4 level.  Please see above further detail.  Critical Value/emergent results were called by telephone at the time of interpretation on 09/15/2012 at 11:09 am to Dr.  McDiarmid, who verbally acknowledged these results.   Original Report Authenticated By: Genia Del, M.D.    Mr Cervical Spine Wo Contrast  09/15/2012  *RADIOLOGY REPORT*  Clinical Data:  Altered mental status.  Pyelonephritis possibly with urosepsis.  MRI BRAIN WITHOUT CONTRAST MRA HEAD WITHOUT CONTRAST  Technique: Multiplanar, multiecho pulse sequences of the brain and surrounding structures were obtained according to standard protocol without intravenous contrast.  Angiographic images of the head were obtained using MRA technique  without contrast.  Comparison: 09/25/2008 head CT.  No comparison MR.  MRI HEAD  Findings:  Motion degraded exam.  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  Tiny small acute infarcts involving the posterior left temporal lobe and left parietal - occipital junction.  Tiny acute infarcts left cerebellum and right cerebellar tonsil.  This distribution of infarcts raises possibility of embolic disease.  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  No intracranial hemorrhage.  Mild small vessel disease type changes.  No intracranial mass lesion detected on this unenhanced exam.  No hydrocephalus.  Opacification in the right mastoid air cells and right middle ear. No obstructing lesion is noted in the region of the posterior- superior nasopharynx causing eustachian tube dysfunction.  No findings of thrombosis of the adjacent dural sinuses.  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  Tiny small acute infarcts involving the posterior left temporal lobe and left parietal - occipital junction.  Tiny acute infarcts left cerebellum and right cerebellar tonsil.  This distribution of infarcts raises possibility of embolic disease.  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  Opacification right mastoid air cells and right middle ear as noted above.  MRA HEAD  Findings: Anterior circulation without medium or large size vessel significant stenosis or occlusion.  Mild narrowing involving portions of the A1 segment of the  anterior cerebral artery bilaterally and the M1 segment of the middle cerebral artery bilaterally.  Mild branch vessel irregularity middle cerebral artery and anterior cerebral artery distribution.  Codominant vertebral arteries.  No high-grade stenosis of the distal vertebral arteries.  Very mild narrowing and irregularity mid aspect of the basilar artery.  Poor delineation AICAs.  Mild irregularity and narrowing involving portions of the superior cerebellar arteries.  No aneurysmal or vascular malformation noted.  IMPRESSION: Branch vessel atherosclerotic type changes as noted above.  MRI CERVICAL SPINE WITHOUT CONTRAST  Technique:  Multiplanar and multiecho pulse sequences of the cervical spine, to include the craniocervical junction and cervicothoracic junction, were obtained without intravenous contrast.  Comparison:   None  Findings:  Motion degraded exam.  Abnormal appearance of the C6-7 vertebra and disc space highly suspicious for a diskitis/osteomyelitis with abnormal appearance of the C6-7 and C7-T1 facet joints extending to involve the left T1 pedicle suspicious for involvement by infection.  Epidural abscess suspected although evaluation limited by motion and the fact that no contrast was administered.  This contributes to spinal stenosis and mild cord flattening (most notable C6 level).  Extension of infectious process into the neural foramen on the right at the C5-6 and C6 level  and on the left at the C5-6 through C7-T1 level.  Abnormal signal interspinous region most notable C5-6 level with extending from the C2 level to the C7 level.  Findings suspicious for spread of infection.  There is also edema within paraspinal musculature throughout the posterior neck consistent with cellulitis.  Slight increased signal within the C6 and C7 spinous process suspicious for involvement by by infection.  No definitive cervical cord signal abnormality noted on this motion degraded exam.  Both vertebral arteries  and carotid arteries are patent.  C2-3:  Bulge.  Facet joint degenerative changes.  Minimal uncinate hypertrophy.  C3-4:  Broad-based disc osteophyte complex.  Moderate spinal stenosis.  Mild cord flattening.  Moderate bilateral foraminal narrowing.  C4-5:  Bulge with tiny central protrusion.  Mild spinal stenosis. Minimal bilateral foraminal narrowing.  C5-6:  Bulge/broad-based protrusion.  Mild spinal stenosis.  Mild cord flattening.  Mild bilateral foraminal narrowing.  C6-7:  Diskitis as noted above.  C7-T1:  Facet joint degenerative changes.  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  Epidural abscess suspected although evaluation limited without contrast.  This contributes to spinal stenosis most prominent C6 level.  Cervical spondylotic changes most notable C3-4 level.  Please see above further detail.  Critical Value/emergent results were called by telephone at the time of interpretation on 09/15/2012 at 11:09 am to Dr. McDiarmid, who verbally acknowledged these results.   Original Report Authenticated By: Genia Del, M.D.    Nm Pulmonary Perf And Vent  09/16/2012  *RADIOLOGY REPORT*  Clinical Data: Chest pain, cough.  NM PULMONARY VENTILATION AND PERFUSION SCAN  Radiopharmaceutical: 6MILLI CURIE MAA TECHNETIUM TO 39M ALBUMIN AGGREGATED , 41 MCI TECHNETIUM 39M DTPA.  Comparison: Chest x-ray performed today.  Findings: Minimal patchy nonsegmental perfusion defects noted.  No significant VQ mismatch to suggest pulmonary embolus.  IMPRESSION: Low probability study for pulmonary embolus.   Original Report Authenticated By: Rolm Baptise, M.D.    Dg Chest Port 1 View  09/29/2012  *RADIOLOGY REPORT*  Clinical Data: Postoperative for mitral valve replacement.  PORTABLE CHEST - 1 VIEW  Comparison: 09/28/2012  Findings: Prosthetic valve projects centrally over the cardiac  shadow.  Left PICC line tip projects over the SVC.  The right IJ line, bilateral chest tubes, and mediastinal drain have been removed.  No pneumothorax identified.  Mild to moderate cardiomegaly noted.  Mild airspace opacity is noted in the left lower lobe.  There is linear subsegmental atelectasis at the right lung base.  IMPRESSION:  1.  Chest tubes and right IJ line have been removed.  No pneumothorax. 2.  Mild airspace opacity at the left lung base, probably atelectasis.  Subsegmental atelectasis at the right lung base. 3.  Cardiomegaly   Original Report Authenticated By: Van Clines, M.D.    Dg Chest Portable 1 View In Am  09/28/2012  *RADIOLOGY REPORT*  Clinical Data: Postop cardiac surgery, follow-up  PORTABLE CHEST - 1 VIEW  Comparison: Portable chest x-ray of 09/27/2012  Findings: There is little change in aeration with mild basilar volume loss present.  Cardiomegaly is stable.  A left chest tube remains and no definite left pneumothorax is seen.  Left PICC line and two right central venous lines are unchanged in position.  A right chest tube also is present with no pneumothorax.  IMPRESSION:  1.  No change in aeration with mild basilar atelectasis. 2.  Bilateral chest tubes remain  with no pneumothorax. 3.  No change in position of central venous lines.   Original Report Authenticated By: Ivar Drape, M.D.    Dg Chest Port 1 View  09/27/2012  *RADIOLOGY REPORT*  Clinical Data: PICC placement.  PORTABLE CHEST - 1 VIEW  Comparison: Portable chest 09/27/2012 at 6:01 a.m.  Findings: The patient has a new left PICC with the tip projecting over the right atrium.  The catheter could be withdrawn 2.5-3 cm for better positioning.  Support tubes lines are otherwise unchanged.  Left basilar atelectasis and small effusion appear improved.  There is cardiomegaly.  No pneumothorax identified.  IMPRESSION:  1.  Tip of left PICC projects in the right atrium.  The catheter could be withdrawn 2.5- 3 cm. 2.  Improved  left basilar atelectasis and effusion.   Original Report Authenticated By: Orlean Patten, M.D.    Dg Chest Portable 1 View In Am  09/27/2012  *RADIOLOGY REPORT*  Clinical Data: Postop mitral valve replacement.  PORTABLE CHEST - 1 VIEW  Comparison: 09/26/2012.  Findings: Interval extubation.  Nasogastric tube has been removed. Right IJ central line tip projects over the SVC.  Right IJ Swan- Ganz catheter tip projects over the right coronary artery. Bilateral chest tubes, mediastinal drains epicardial pacer wires remain in place. Left PICC tip projects over the SVC.  Sternotomy wires are unchanged in position.  Heart is enlarged, stable.  Lungs are somewhat low in volume with mild bibasilar air space disease, left greater than right. Probable small left pleural effusion.  No definite pneumothorax.  IMPRESSION:  1.  Somewhat low lung volumes with bibasilar air space disease, left greater than right, and probable small left pleural effusion. 2.  No definite pneumothorax.   Original Report Authenticated By: Lorin Picket, M.D.    Dg Chest Portable 1 View  09/26/2012  *RADIOLOGY REPORT*  Clinical Data: 67 year old male - status post cardiac valve replacement.  PORTABLE CHEST - 1 VIEW  Comparison: 09/16/2012 and prior chest radiographs  Findings: Cardiomegaly and evidence of cardiac valve replacement noted. An endotracheal tube with tip 3.5 cm above the carina, a left PICC line with tip overlying the mid SVC, an NG tube entering the stomach, a right IJ Swan-Ganz catheter with tip overlying the right main pulmonary artery, a right IJ central venous cath with tip overlying the lower SVC, and mediastinal/right thoracostomy tubes. A left pleural effusion is noted as well as bibasilar atelectasis. There is no evidence of pneumothorax.  IMPRESSION: Postoperative changes with support apparatus as described.  Left pleural effusion and bibasilar atelectasis - no evidence of pneumothorax.   Original Report Authenticated By:  Margarette Canada, M.D.    Dg Abd Portable 1v  09/18/2012  *RADIOLOGY REPORT*  Clinical Data: Intermittent abdominal pain.  PORTABLE ABDOMEN - 1 VIEW  Comparison: None.  Findings: Nonspecific bowel gas pattern with gas filled bowel predominately colon measuring up to 8 cm (distal transverse colon) with small bowel measuring up to 3 cm.  This may indicate an ileus. Result of underlying inflammatory process not entirely excluded.  The possibility of free intraperitoneal air cannot be addressed on a supine view.  Right hip joint degenerative changes.  Degenerative changes lumbar spine.  Cardiomegaly.  IMPRESSION: Nonspecific gas pattern as discussed above.   Original Report Authenticated By: Genia Del, M.D.    Dg Foot Complete Right  09/14/2012  *RADIOLOGY REPORT*  Clinical Data: Pain  RIGHT FOOT COMPLETE - 3+ VIEW  Comparison: Yesterday  Findings: A BB has been utilized  to mark the scan location of pain. There is corresponds to the area of skin just dorsal to the the wire is transfixed in the distal first metatarsal.  Stable hardware compared with yesterday.  No new fracture.  Degenerative changes at the first metatarsophalangeal joint.  Central erosions involving the first metatarsal head are stable.  No aggressive periosteal reaction.  IMPRESSION: A skin BB marks the location of pain overlying the hardware in the distal first metatarsal.  Stable degenerative and postoperative changes.   Original Report Authenticated By: Marybelle Killings, M.D.    Dg Foot Complete Right  09/13/2012  *RADIOLOGY REPORT*  Clinical Data: Ulcer  RIGHT FOOT COMPLETE - 3+ VIEW  Comparison: None.  Findings: Three views of the right foot submitted.  Study is limited by diffuse osteopenia.  No acute fracture or subluxation. Postsurgical metallic fixation nails are noted distal aspect first metatarsal.  No periosteal reaction or bony erosion.  Small plantar spur of the calcaneus.  IMPRESSION: No acute fracture or subluxation.  No periosteal  reaction or bony erosion.  Diffuse osteopenia.  Postsurgical changes distal aspect first metatarsal.   Original Report Authenticated By: Lahoma Crocker, M.D.     ASSESSMENT/PLAN:  1. 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 Fibrinous pericarditis  3. Atrial fibrillation now in NSR with first degree AV block on Amiodarone/Lopressor/IV Heparin  4. HTN controlled on amlodipine/Lopressor  5. Post op anemia  6. Bilateral DVT on IV Heparin - coumadin on hold for vascular procedure Tuesday     Sueanne Margarita, MD  10/01/2012  10:03 AM

## 2012-10-01 NOTE — Progress Notes (Signed)
ANTICOAGULATION CONSULT NOTE - Follow Up Consult  Pharmacy Consult for heparin Indication: DVT  No Known Allergies  Patient Measurements: Height: 6' (182.9 cm) Weight: 198 lb 8 oz (90.039 kg) IBW/kg (Calculated) : 77.6  Heparin Dosing Weight: 87 kg  Vital Signs: Temp: 98.9 F (37.2 C) (01/12 0434) Temp src: Oral (01/12 0434) BP: 129/68 mmHg (01/12 0434) Pulse Rate: 86  (01/12 0434)  Labs:  Basename 10/01/12 1245 10/01/12 0655 09/30/12 0516 09/30/12 0511 09/29/12 0510  HGB -- 8.5* -- 7.1* --  HCT -- 25.7* -- 21.3* 23.9*  PLT -- 274 -- 311 283  APTT -- -- -- -- --  LABPROT -- -- -- -- --  INR -- -- -- -- --  HEPARINUNFRC 0.22* 0.21* 0.60 -- --  CREATININE -- 0.79 -- 0.84 1.02  CKTOTAL -- -- -- -- --  CKMB -- -- -- -- --  TROPONINI -- -- -- -- --    Estimated Creatinine Clearance: 97 ml/min (by C-G formula based on Cr of 0.79).  Assessment: Patient is a 67 y.o M on heparin for DVT.  Heparin was held for some time yesterday for blood transfusion and was not resumed until around ~3:30 this morning.  Heparin level is subtherapeutic 0.22 .  No issues with IV line per RN.  Hgb increased to 8.5 after transfusion.  No bleeding noted.  Holding off on starting coumadin for now until after angiogram procedure on Tuesday.  Goal of Therapy:  Heparin level 0.3-0.7 units/ml Monitor platelets by anticoagulation protocol: Yes   Plan:  1) increase heparin drip to 1600 units/hr  2) check 6 hour heparin level    Zlatan Hornback P 10/01/2012,1:45 PM

## 2012-10-01 NOTE — Progress Notes (Addendum)
5 Days Post-Op Procedure(s) (LRB): MITRAL VALVE (MV) REPLACEMENT (N/A) INTRAOPERATIVE TRANSESOPHAGEAL ECHOCARDIOGRAM (N/A) Subjective:  Mr. Koob has no complaints this morning.  He states he has not had any further diarrhea after yesterday's episode.  He also states he thinks the blood helped with his energy level.  Objective: Vital signs in last 24 hours: Temp:  [97.7 F (36.5 C)-99.1 F (37.3 C)] 98.9 F (37.2 C) (01/12 0434) Pulse Rate:  [73-94] 86  (01/12 0434) Cardiac Rhythm:  [-] Heart block (01/12 0745) Resp:  [16-20] 20  (01/12 0434) BP: (111-132)/(55-71) 129/68 mmHg (01/12 0434) SpO2:  [95 %-98 %] 97 % (01/12 0434) Weight:  [198 lb 8 oz (90.039 kg)] 198 lb 8 oz (90.039 kg) (01/12 0434)  Intake/Output from previous day: 01/11 0701 - 01/12 0700 In: 1393.3 [P.O.:120; I.V.:500; Blood:773.3] Out: 1803 [Urine:1800; Stool:3]  General appearance: alert, cooperative and no distress Heart: regular rate and rhythm Lungs: clear to auscultation bilaterally Abdomen: soft, non-tender; bowel sounds normal; no masses,  no organomegaly Extremities: edema trace Wound: clean and dry  Lab Results:  [facility] 10/01/12 0655 09/30/12 0511  WBC 11.7* 13.9*  HGB 8.5* 7.1*  HCT 25.7* 21.3*  PLT 274 311   BMET:  Basename 10/01/12 0655 09/30/12 0511  NA 136 137  K 3.7 4.0  CL 100 102  CO2 24 26  GLUCOSE 95 81  BUN 14 20  CREATININE 0.79 0.84  CALCIUM 8.1* 7.9*    PT/INR: No results found for this basename: LABPROT,INR in the last 72 hours ABG    Component Value Date/Time   PHART 7.411 09/27/2012 0256   HCO3 24.7* 09/27/2012 0256   TCO2 23 09/27/2012 1753   ACIDBASEDEF 1.1 09/13/2012 0411   O2SAT 97.0 09/27/2012 0256   CBG (last 3)   Basename 10/01/12 0606 09/30/12 2058 09/30/12 1624  GLUCAP 135* 93 102*    Assessment/Plan: S/P Procedure(s) (LRB): MITRAL VALVE (MV) REPLACEMENT (N/A) INTRAOPERATIVE TRANSESOPHAGEAL ECHOCARDIOGRAM (N/A)  1. CV- Previous A. Fib, NSR  currently- on Amiodarone, Norvasc 2. Pulm- no acute issues, continue IS 3. DVT- bilaterally, on Heparin, will continue Hold Coumadin 4. Expected post operative anemia- improved after transfusion, up to 8.5, will continue to monitor, occult blood negative 5. Diarrhea- improved, C. Diff negative, will d/c Flagyl 6. Endocarditis- ID following, Cont ABX 7. DIspo- patient doing better today, no further diarrhea, will monitor H/H, plan for Vascular intervention on Tuesday with Dr. Trula Slade   LOS: 19 days    Ellwood Handler 10/01/2012   I have seen and examined the patient and agree with the assessment and plan as outlined.  Jakaya Jacobowitz H 10/01/2012 1:23 PM

## 2012-10-01 NOTE — Progress Notes (Signed)
ANTICOAGULATION CONSULT NOTE - Follow Up Consult  Pharmacy Consult for heparin Indication: DVT  No Known Allergies  Patient Measurements: Height: 6' (182.9 cm) Weight: 198 lb 8 oz (90.039 kg) IBW/kg (Calculated) : 77.6  Heparin Dosing Weight: 87 kg  Vital Signs: Temp: 98.7 F (37.1 C) (01/12 1806) Temp src: Oral (01/12 1806) BP: 123/68 mmHg (01/12 1806) Pulse Rate: 84  (01/12 1806)  Labs:  Basename 10/01/12 1931 10/01/12 1245 10/01/12 0655 09/30/12 0511 09/29/12 0510  HGB -- -- 8.5* 7.1* --  HCT -- -- 25.7* 21.3* 23.9*  PLT -- -- 274 311 283  APTT -- -- -- -- --  LABPROT -- -- -- -- --  INR -- -- -- -- --  HEPARINUNFRC 0.47 0.22* 0.21* -- --  CREATININE -- -- 0.79 0.84 1.02  CKTOTAL -- -- -- -- --  CKMB -- -- -- -- --  TROPONINI -- -- -- -- --    Estimated Creatinine Clearance: 97 ml/min (by C-G formula based on Cr of 0.79).   Medications:  Scheduled:    . amiodarone  200 mg Oral BID  . amLODipine  2.5 mg Oral Daily  . atorvastatin  40 mg Oral q1800  .  ceFAZolin (ANCEF) IV  2 g Intravenous Q8H  . colchicine  0.6 mg Oral BID  . docusate sodium  200 mg Oral Daily  . feeding supplement  1 Container Oral TID WC  . [COMPLETED] furosemide  20 mg Intravenous Once  . [COMPLETED] furosemide  20 mg Intravenous Once  . [COMPLETED] furosemide  40 mg Oral Daily  . iron polysaccharides  150 mg Oral Daily  . metoprolol tartrate  12.5 mg Oral BID  . pantoprazole  40 mg Oral QAC breakfast  . [COMPLETED] potassium chloride  20 mEq Oral BID  . sodium chloride  3 mL Intravenous Q12H  . [DISCONTINUED] metroNIDAZOLE  500 mg Oral Q8H   Infusions:    . heparin 1,600 Units/hr (10/01/12 1602)    Assessment: 67 yo male with DVT is currently on therapeutic heparin.  Heparin level was 0.47 Goal of Therapy:  Heparin level 0.3-0.7 units/ml Monitor platelets by anticoagulation protocol: Yes   Plan:  1) Continue heparin at 1600 units/hr 2) Heparin level and CBC in am.  Rhanda Lemire,  Tsz-Yin 10/01/2012,8:15 PM

## 2012-10-01 NOTE — Progress Notes (Signed)
FMTS Attending NOte Patient seen and examined by me, discussed with Dr Wendi Snipes and I agree with his note for today.  Mr Ryan [name] offers no complaints; pain adequately controlled today.  To begin considering dispo options for either CIR versus SNF with rehab/PT once vascular issues addressed. Dalbert Mayotte, M.D.

## 2012-10-02 LAB — CBC
HCT: 26.6 % — ABNORMAL LOW (ref 39.0–52.0)
Hemoglobin: 8.9 g/dL — ABNORMAL LOW (ref 13.0–17.0)
MCH: 24.6 pg — ABNORMAL LOW (ref 26.0–34.0)
MCHC: 33.5 g/dL (ref 30.0–36.0)
MCV: 73.5 fL — ABNORMAL LOW (ref 78.0–100.0)
Platelets: 275 10*3/uL (ref 150–400)
RBC: 3.62 MIL/uL — ABNORMAL LOW (ref 4.22–5.81)
RDW: 21.8 % — ABNORMAL HIGH (ref 11.5–15.5)
WBC: 12.1 10*3/uL — ABNORMAL HIGH (ref 4.0–10.5)

## 2012-10-02 LAB — HEPARIN LEVEL (UNFRACTIONATED): Heparin Unfractionated: 0.41 IU/mL (ref 0.30–0.70)

## 2012-10-02 LAB — GLUCOSE, CAPILLARY
Glucose-Capillary: 96 mg/dL (ref 70–99)
Glucose-Capillary: 90 mg/dL (ref 70–99)

## 2012-10-02 MED ORDER — PRO-STAT SUGAR FREE PO LIQD
30.0000 mL | Freq: Two times a day (BID) | ORAL | Status: DC
  Administered 2012-10-02 – 2012-10-06 (×8): 30 mL via ORAL
  Filled 2012-10-02 (×10): qty 30

## 2012-10-02 MED ORDER — ENSURE COMPLETE PO LIQD
237.0000 mL | Freq: Two times a day (BID) | ORAL | Status: DC
  Administered 2012-10-02 – 2012-10-06 (×6): 237 mL via ORAL

## 2012-10-02 NOTE — Progress Notes (Signed)
Patient attempted to ambulate in the hall, but only got just outside of his room when we ended the walk due to weakness and fatigue. The patient ambulated approximately 50 ft, without oxygen and with a nurse and his IV pole for support. After getting the patient to bed, he requested pain medication. Patient was medicated and left with call bell in reach.

## 2012-10-02 NOTE — Plan of Care (Signed)
Problem: Food- and Nutrition-Related Knowledge Deficit (NB-1.1) Goal: Nutrition education Formal process to instruct or train a patient/client in a skill or to impart knowledge to help patients/clients voluntarily manage or modify food choices and eating behavior to maintain or improve health.  Outcome: Completed/Met Date Met:  10/02/12  RD consulted 1/9 for nutrition education regarding diabetes ---> patient was not appropriate at that time, on SICU s/p mitral valve replacement.    Lab Results  Component Value Date    HGBA1C 6.5* 09/14/2012    RD provided "Carbohydrate Counting for People with Diabetes" handout from the Academy of [employer]. Discussed different food groups and their effects on blood sugar, emphasizing carbohydrate-containing foods. Provided list of carbohydrates and recommended serving sizes of common foods.  Recommended diet/sugar-free beverage choices ---> examples provided.  Expect good compliance.  RD available for follow-up should patient should have further questions.  Arthur Holms, RD, LDN Pager #: [phone] After-Hours Pager #: [phone]

## 2012-10-02 NOTE — Progress Notes (Signed)
CARDIAC REHAB PHASE I   PRE:  Rate/Rhythm: 79SR  BP:  Supine: 130/70  Sitting:   Standing:    SaO2: 97%RA  MODE:  Ambulation: 150 ft   POST:  Rate/Rhythem: 115ST  BP:  Supine:   Sitting: 133/62  Standing:    SaO2: 96%RA 0800-0850 Pt receptive to walking. Walked 150 ft on RA with gait belt and rolling walker and asst x 2. Followed with wheelchair. Pt likes folded towel on right side of walker. Pt c/o right leg weakness which limits how far he can go without sitting. Pt sat 4 times to walk 150 ft. Encouragement given and praise as this is the farthest pt has walked. To recliner after walk. Set up breakfast. Pt encouraged to let walker loose when sitting because he has tendency to pull it on him when he sits.  Jeani Sow

## 2012-10-02 NOTE — Progress Notes (Signed)
NUTRITION FOLLOW UP  Intervention:    Ensure Complete twice daily between meals (350 kcals, 13 gm protein per 8 fl oz carton)  Prostat liquid protein twice daily with meals (100 kcals, 15 gm protein per dose) RD to follow for nutrition care plan  Nutrition Dx:   Increased nutrient needs related to wound healing as evidenced by estimated nutrition needs, ongoing  Goal:   Oral intake with meals & supplements to meet >/= 90% of estimated nutrition needs, unmet  Monitor:   PO & supplemental intake, weight, labs, I/O's  Assessment:   Patient transferred out of SICU.  Reports his appetite is poor; says he was having some diarrhea.   PO intake at 5% per flowsheet records.  Resource Gwyneth Revels is too sweet for him ---> will order Ensure and additional protein supplement.  Height: Ht Readings from Last 1 Encounters:  09/21/12 6' (1.829 m)    Weight Status:   Wt Readings from Last 1 Encounters:  10/01/12 198 lb 8 oz (90.039 kg)    Re-estimated needs:  Kcal: 2300-2500 Protein: 130-140 gm Fluid: 2.3-2.5 L  Skin: non-healing wound to R foot, incisions to R arm, R foot & chest  Diet Order: Cardiac   Intake/Output Summary (Last 24 hours) at 10/02/12 1405 Last data filed at 10/01/12 2253  Gross per 24 hour  Intake      0 ml  Output   1601 ml  Net  -1601 ml    Last BM: 1/12  Labs:   Lab 10/01/12 0655 09/30/12 0511 09/29/12 0510 09/27/12 1725 09/27/12 0415 09/26/12 1945  NA 136 137 135 -- -- --  K 3.7 4.0 4.0 -- -- --  CL 100 102 101 -- -- --  CO2 24 26 25  -- -- --  BUN 14 20 27* -- -- --  CREATININE 0.79 0.84 1.02 -- -- --  CALCIUM 8.1* 7.9* 8.1* -- -- --  MG -- -- -- 2.4 2.5 3.0*  PHOS -- -- -- -- -- --  GLUCOSE 95 81 101* -- -- --    CBG (last 3)   Basename 10/02/12 0603 10/01/12 2129 10/01/12 1634  GLUCAP 96 90 119*    Scheduled Meds:   . amiodarone  200 mg Oral BID  . amLODipine  2.5 mg Oral Daily  . atorvastatin  40 mg Oral q1800  .  ceFAZolin (ANCEF) IV   2 g Intravenous Q8H  . colchicine  0.6 mg Oral BID  . docusate sodium  200 mg Oral Daily  . feeding supplement  1 Container Oral TID WC  . iron polysaccharides  150 mg Oral Daily  . metoprolol tartrate  12.5 mg Oral BID  . pantoprazole  40 mg Oral QAC breakfast  . sodium chloride  3 mL Intravenous Q12H    Continuous Infusions:   . heparin 1,600 Units/hr (10/02/12 0847)    Arthur Holms, RD, LDN Pager #: [phone] After-Hours Pager #: [phone]

## 2012-10-02 NOTE — Progress Notes (Signed)
ANTICOAGULATION CONSULT NOTE - Follow Up Consult  Pharmacy Consult for heparin Indication: DVT  No Known Allergies  Patient Measurements: Height: 6' (182.9 cm) Weight: 198 lb 8 oz (90.039 kg) IBW/kg (Calculated) : 77.6  Heparin Dosing Weight: 87 kg  Vital Signs:    Labs:  Basename 10/02/12 0630 10/01/12 1931 10/01/12 1245 10/01/12 0655 09/30/12 0511  HGB 8.9* -- -- 8.5* --  HCT 26.6* -- -- 25.7* 21.3*  PLT 275 -- -- 274 311  APTT -- -- -- -- --  LABPROT -- -- -- -- --  INR -- -- -- -- --  HEPARINUNFRC 0.41 0.47 0.22* -- --  CREATININE -- -- -- 0.79 0.84  CKTOTAL -- -- -- -- --  CKMB -- -- -- -- --  TROPONINI -- -- -- -- --    Estimated Creatinine Clearance: 97 ml/min (by C-G formula based on Cr of 0.79).   Medications:  Scheduled:     . amiodarone  200 mg Oral BID  . amLODipine  2.5 mg Oral Daily  . atorvastatin  40 mg Oral q1800  .  ceFAZolin (ANCEF) IV  2 g Intravenous Q8H  . colchicine  0.6 mg Oral BID  . docusate sodium  200 mg Oral Daily  . feeding supplement  1 Container Oral TID WC  . [COMPLETED] furosemide  40 mg Oral Daily  . iron polysaccharides  150 mg Oral Daily  . metoprolol tartrate  12.5 mg Oral BID  . pantoprazole  40 mg Oral QAC breakfast  . sodium chloride  3 mL Intravenous Q12H  . [DISCONTINUED] metroNIDAZOLE  500 mg Oral Q8H   Infusions:     . heparin 1,600 Units/hr (10/01/12 1602)    Assessment: 67yo male sent to ED from OSH for concerns for ischemic leg w/ 2d h/o pain, vascular exam shows chronic vascular dz. Noted to have AMS, to begin IV ABX for pyelonephritis with possible urosepsis.   AC: Heparin for ischemic leg d/c'd 12/27 2/2 suspected pericarditis. Now with B DVT requiring heparin, MD wants no bolus given recent surgery; coumadin on hold for angiogram on 1/14. Heparin level 0.41. Hgb stable.  Goal of Therapy:  Heparin level 0.3-0.7 units/ml Monitor platelets by anticoagulation protocol: Yes   Plan:  1) Continue heparin  at 1600 units/hr 2) Angiogram today for PVD  Eilene Ghazi Stillinger 10/02/2012,8:45 AM

## 2012-10-02 NOTE — Progress Notes (Signed)
Occupational Therapy Treatment Patient Details Name: Ryan [name] MRN: [id number] DOB: 01-01-1946 Today's Date: 10/02/2012 Time: [id number] OT Time Calculation (min): 37 min  OT Assessment / Plan / Recommendation Comments on Treatment Session Focus on R hand retrograde massage, edema control, gentle grade 1 Mp, IP joint mobs, gemtle passive stretching and A/AAROM. Pt limited in all ROM. Educated pt on imiportance of positionng and increasing ROM with R hand    Follow Up Recommendations  CIR    Barriers to Discharge       Equipment Recommendations  3 in 1 bedside comode;Tub/shower bench    Recommendations for Other Services Rehab consult  Frequency Min 4X/week (HAND)   Plan Discharge plan needs to be updated    Precautions / Restrictions Precautions Precautions: Fall;Sternal Restrictions RLE Weight Bearing: Weight bearing as tolerated   Pertinent Vitals/Pain C/o pain with movement.    ADL  ADL Comments: FOCUS OF SESSION ON r HAND/WRIST REHAB USING GENTLE PASSIVE STRETCHING, RETROGRADE MASSAGE, GENTLE mp JOINT MOB AND EDEMA CONTROL AND P/AA/AOM.    OT Diagnosis:    OT Problem List:   OT Treatment Interventions:     OT Goals Acute Rehab OT Goals OT Goal Formulation: With patient/family Time For Goal Achievement: 10/13/12 Potential to Achieve Goals: Good ADL Goals Pt Will Perform Grooming: with supervision;Standing at sink Pt Will Perform Upper Body Bathing: with supervision;Sitting, chair;Sitting, edge of bed Pt Will Perform Lower Body Bathing: with mod assist;Sit to stand from bed;with adaptive equipment Pt Will Perform Upper Body Dressing: with supervision;Unsupported;Sitting, bed Pt Will Perform Lower Body Dressing: with mod assist;Unsupported;Sit to stand from bed Pt Will Transfer to Toilet: with min assist;Ambulation;with DME;Comfort height toilet Pt Will Perform Toileting - Clothing Manipulation: with mod assist;Sitting on 3-in-1 or toilet;Standing Pt Will Perform  Toileting - Hygiene: with mod assist;Sit to stand from 3-in-1/toilet Miscellaneous OT Goals Miscellaneous OT Goal #1: Pt will independently perform right hand AROM/AAROM 2-3 x daily. OT Goal: Miscellaneous Goal #1 - Progress: Progressing toward goals Miscellaneous OT Goal #2: Pt will independently perform neck AROM 2-3 x daily. Miscellaneous OT Goal #3: Pt will independently maintain sternal precautions during functional mobility. OT Goal: Miscellaneous Goal #3 - Progress: Progressing toward goals  Visit Information  Last OT Received On: 10/02/12    Subjective Data      Prior Functioning       Cognition       Mobility  Shoulder Instructions         Exercises  Shoulder Exercises Digit Composite Flexion: AROM;AAROM;10 reps;Squeeze ball Composite Extension: AAROM;AROM;10 reps;Right;Supine Hand Exercises Forearm Supination: AROM;AAROM;10 reps;Seated Forearm Pronation: AROM;AAROM;10 reps;Supine Wrist Flexion: AROM;AAROM;PROM;10 reps;Right Wrist Extension: PROM;AAROM;AROM;Right;10 reps Wrist Ulnar Deviation: AROM;AAROM;Right;10 reps Wrist Radial Deviation: PROM;AROM;AAROM;Right;10 reps Digit Composite Flexion: PROM;AROM;AAROM;Right;10 reps;Other (comment) (gentle passive stretch) Composite Extension: 10 reps;PROM;AROM;AAROM Digit Composite Abduction: AROM;AAROM;PROM;10 reps;Supine Digit Composite Adduction: PROM;AROM;AAROM;10 reps;Seated Opposition: AROM;AAROM;PROM;10 reps Other Exercises Other Exercises: retrograde massage Other Exercises: M/IP joint mob   Balance     End of Session OT - End of Session Activity Tolerance: Patient tolerated treatment well Patient left: in bed;with call bell/phone within reach Nurse Communication: Mobility status  GO     Tawfiq Favila,HILLARY 10/02/2012, 7:16 PM [facility], OTR/L  [phone] 10/02/2012

## 2012-10-02 NOTE — Progress Notes (Addendum)
                    [address]            [place] [address]          [phone]     6 Days Post-Op Procedure(s) (LRB): MITRAL VALVE (MV) REPLACEMENT (N/A) INTRAOPERATIVE TRANSESOPHAGEAL ECHOCARDIOGRAM (N/A)  Subjective: Still weak with mobility.  Appetite poor.  No further diarrhea.    Objective: Vital signs in last 24 hours: Patient Vitals for the past 24 hrs:  BP Temp Temp src Pulse Resp SpO2  10/01/12 2025 127/63 mmHg 98.3 F (36.8 C) Oral 86  20  98 %  10/01/12 1806 123/68 mmHg 98.7 F (37.1 C) Oral 84  - 96 %   Current Weight  10/01/12 198 lb 8 oz (90.039 kg)   PRE-OPERATIVE WEIGHT: 92 kg   Intake/Output from previous day: 01/12 0701 - 01/13 0700 In: 240 [P.O.:240] Out: 1601 [Urine:1600; Stool:1]    PHYSICAL EXAM:  Heart: RRR Lungs: Clear Wound: Clean and dry Extremities: +RLE edema    Lab Results: CBC: Basename 10/02/12 0630 10/01/12 0655  WBC 12.1* 11.7*  HGB 8.9* 8.5*  HCT 26.6* 25.7*  PLT 275 274   BMET:  Basename 10/01/12 0655 09/30/12 0511  NA 136 137  K 3.7 4.0  CL 100 102  CO2 24 26  GLUCOSE 95 81  BUN 14 20  CREATININE 0.79 0.84  CALCIUM 8.1* 7.9*    PT/INR: No results found for this basename: LABPROT,INR in the last 72 hours    Assessment/Plan: S/P Procedure(s) (LRB): MITRAL VALVE (MV) REPLACEMENT (N/A) INTRAOPERATIVE TRANSESOPHAGEAL ECHOCARDIOGRAM (N/A)  CV- AF, now maintaining SR. BPs stable.  Continue Amio, Lopressor, Norvasc.  Bilat LE DVT- Continue Heparin.  Coumadin held for angiogram in am.  Endocarditis/MSSA bacteremia- continue Ancef x 6 weeks total postop.  ID following.  PVD- For Angio in am by Dr. Trula Slade.  Vol overload- diurese.  Mobilize as tolerated, continue pulm toilet.   LOS: 20 days    COLLINS,GINA H 10/02/2012  Chart reviewed, patient examined, agree with above.   He walked well today. He is to have arteriogram tomorrow and then will start coumadin if no further  intervention needed.

## 2012-10-02 NOTE — Progress Notes (Signed)
Family Medicine Teaching Service Daily Progress Note Service Page: [phone]  Patient Assessment: 67 y.o. year old male with acute encephalopathy (now resolved) secondary to sepsis from multiple sources, s/p I&D of R wrist and R MTP lesion (with HW removal), Multiple small R hemisphere infarcts that are likely septic emboli, C6-T1 Diskitis, pericarditis,  Endocarditis (s/p MVR), and E. Coli UTI (s/p treatment). Now s/p mitral valve replacement on 1/7.  Subjective:  Feeling better this am, up walking with nursing. Pain controlled on current meds. No dyspnea.   Objective: Temp:  [98.3 F (36.8 C)-98.7 F (37.1 C)] 98.3 F (36.8 C) (01/12 2025) Pulse Rate:  [84-86] 86  (01/12 2025) Resp:  [20] 20  (01/12 2025) BP: (123-127)/(63-68) 127/63 mmHg (01/12 2025) SpO2:  [96 %-98 %] 98 % (01/12 2025)  Exam: Gen: NAD, alert, cooperative with exam, sleeping comfortably in bed HEENT: NCAT, EOMI, MMM Neck: no tenderness to palpation of Cervical spine, improved ROM compared to 1/11  CV: RRR, good S1/S2 with additional S3 Resp: CTABL, no wheezes, non-labored Abd: Soft, NTND, + BS Ext: 1+ pitting edema on RLE, warm; dressing in place to right wrist/hand and right food-clean/dry/intact Neuro: Alert, oriented/appropriate in conversation, face symmetric;  Speaks easily  I have reviewed the patient's medications, labs, imaging, and diagnostic testing.  Notable results are summarized below.  CBC BMET   Lab 10/02/12 0630 10/01/12 0655 09/30/12 0511  WBC 12.1* 11.7* 13.9*  HGB 8.9* 8.5* 7.1*  HCT 26.6* 25.7* 21.3*  PLT 275 274 311    Lab 10/01/12 0655 09/30/12 0511 09/29/12 0510  NA 136 137 135  K 3.7 4.0 4.0  CL 100 102 101  CO2 24 26 25   BUN 14 20 27*  CREATININE 0.79 0.84 1.02  GLUCOSE 95 81 101*  CALCIUM 8.1* 7.9* 8.1*    Blood Culture 12/26- No growth- final Blood culture 12/30- No growth- final C. Diff 1/4 - negative  Imaging/Diagnostic Tests: TEE 09/18/2012 - The estimated  ejection fraction was in the range of 60% to 65%. - Aortic valve: No evidence of vegetation. - Mitral valve: Large 2.5cm diameter vegetation - Left atrium: No evidence of thrombus in the atrial cavity or appendage. - Right atrium: No evidence of thrombus in the atrial cavity or appendage. - Tricuspid valve: No evidence of vegetation. - Pulmonic valve: No evidence of vegetation. - Pericardium, extracardiac: A small, free-flowing pericardial effusion was identified along the right atrial free wall.  MRI Head 12/27 Several small areas of acute infarction involving the right  hemisphere including portions of the; right occipital lobe, right  temporal lobe, right opercular region, posterior right frontal lobe  and right parietal lobe.  MRI Cervical Spine 12/27 C6-7 diskitis with spread of infection to the adjacent facet  joints, spinous process and interspinous ligaments/posterior  paraspinal musculature as discussed above. Epidural abscess  suspected although evaluation limited without contrast.  CXR 09/26/2012 IMPRESSION:  Postoperative changes with support apparatus as described.  Left pleural effusion and bibasilar atelectasis - no evidence of  pneumothorax.  CXR 09/27/2012 IMPRESSION:  1. Somewhat low lung volumes with bibasilar air space disease,  left greater than right, and probable small left pleural effusion.  2. No definite pneumothorax.  Venous duplex 09/28/2012 Summary: Findings consistent with deep vein thrombosis involving the right mid femoral vein (behind a valve leaflet), the right gastrocnemius vein and the left gastrocnemius vein.   Plan: Esaias Kromer is a 67 y.o. year old male with acute encephalopathy secondary to sepsis from multiple  sources.  Now post-op from mitral valve replacement on 09/26/2012. VVS planning andio and possible intervention tomorrow.   PAD/Ischemic limb:  - Limb warm - Vascular on board- appreciate reccomendations - concern on admission  that LE was ischemic.  - R ABI 0.48 on 1/2/214, L ABI WNL - Vascular plans to perform LE angio on 1/14 and intervene as necessary  Post-op Anemia - Hgb dropped to 7.1 on 1/11, now stable at 8.9 post transfusion 2 units PRBC on 1/11 - fecal occult blood negative  DVT - BL DVT likely from bedbound state since admission - Heparin drip per Dr. Cyndia Bent  - Hold on transitioning to PO anticoagulation due to probable need for vascular surgery next Tuesday (1/14) per Dr. Stephens Shire request  - monitor  CV- Stable off pressors - s/p Mitral valve replacement day 6 - Cardiology and CTS on board, greatly appreciate recommendations - Lasix per cards/CTS, 40mg  PO for 3 days- dyspnea and fluid status improved, + 1.1L for admission - Heart cath on 09/21/2012, Findings:  - Widely patent coronary arteries, pulmonary HTN with PCWP of 45mmHg  - Post-op pain- PRN oxycodone and tramadol   Afib with RVR- Pericarditis - Afib likely due to pericarditis, pericarditis improving - causing Afib, currently in NSR  with amiodarone - continue per cardiology - TEE on 12/30 confirmed MV vegetation listed below and slight improvement of pericarditis  Acute Diastolic Heart failure - will likely improve s/p MVR (done 1/7), improving evidenced by dyspnea and fluid status - secondary to mitral regurgitation caused by large vegetation now replaced - Lasix per cards/CTS- 40mg  PO for 3 days - Strict I/O, daily weights - UOP 1600 mL, Positive 1.1 for admission today, was 2.6 L on 1/12  Resp-  - stable on RA - no complaints of dyspnea currently  - CXR with Small L pleural effusion, chest tube placed by CTS 1/8 and removed 1/9  ID - metastatic infection - ID on Board-   - Continue Ancef, likely will need 6-8 weeks  - per Dr. 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  - Resolved, follow daily BMET  Elevated Transaminases  - Pt AST:ALT 2:1 consistent with alcoholic picture. EtOH on admission < 11  - consider recheck LFT's  Newly Dx DM II - A1C 6.5 on check  - Insulin drip 1/8, transitioned to  subq insulin post op, now monitoring CBGs and SSI if needed - CBG's 90-119 - Consider sensitive SSI if starts to develop hyperglycemia, Blood glucose fine for now.   FEN/GI:  - Carb modified diet  - KVO NS - Strict I/O  Prophylaxis: SCD Disposition: Step down for now, CIR vs SNF with PT after workup.   Kenn File, MD 10/02/2012, 9:16 AM

## 2012-10-02 NOTE — Progress Notes (Signed)
Physical Therapy Treatment Patient Details Name: Ryan [name] MRN: [id number] DOB: 24-Mar-1946 Today's Date: 10/02/2012 Time: [id number] PT Time Calculation (min): 49 min  PT Assessment / Plan / Recommendation Comments on Treatment Session  Pt now s/p MVR (in addition to sepsis, I&D Rt wrist, Rt foot ulcer, C6-T1 discitis, Rt CVAs, metabolic encephalopathy, and bil LE DVTs). Pt continues to make progress despite multiple medical complications. Pt is motivated and cognition is improving. Agree with recommendation for Rehab Consult.    Follow Up Recommendations  CIR                 Equipment Recommendations  Other (comment) (Rt platform RW with 5" wheels)    Recommendations for Other Services Rehab consult  Frequency Min 3X/week   Plan Discharge plan remains appropriate;Frequency remains appropriate    Precautions / Restrictions Precautions Precautions: Fall;Sternal Restrictions RUE Weight Bearing: Weight bear through elbow only RLE Weight Bearing: Weight bearing as tolerated   Pertinent Vitals/Pain Reported pain in abd with bending forward for sit to stand    Mobility  Bed Mobility Bed Mobility: Sit to Sidelying Right;Rolling Left;Scooting to HOB Rolling Left: 4: Min guard Sit to Sidelying Right: 4: Min assist;HOB elevated (HOB 10) Scooting to HOB: 1: +2 Total assist Scooting to [facility]: Patient Percentage: 30% Details for Bed Mobility Assistance: instructional cues to protect sternum Transfers Transfers: Sit to Stand;Stand to Sit Sit to Stand: 1: +2 Total assist;Without upper extremity assist;From chair/3-in-1 Sit to Stand: Patient Percentage: 60% Stand to Sit: 4: Min assist;Without upper extremity assist;To bed Details for Transfer Assistance: attempted stand from chair with +1 assist and pt fearful with lack of weight shift forward over his feet and unable; with +2 assist, pt less fearful and did a much better job coming forward (assist to complete forward and leg extension  to upright) Ambulation/Gait Ambulation/Gait Assistance: 4: Min assist Ambulation Distance (Feet): 80 Feet Assistive device: Right platform walker Ambulation/Gait Assistance Details: vc for less pressure through UEs as walking with RW; assist for upright posture; assist with turning RW; vc to widen stance to incr balance (RLE tends to adduct) Gait Pattern: Step-through pattern;Decreased step length - right;Decreased stance time - right;Trunk flexed Gait velocity: decreased    Exercises General Exercises - Lower Extremity Ankle Circles/Pumps: AROM;Both;10 reps;Seated Long Arc Quad: AROM;Both;5 reps;Seated Heel Slides: AROM;Both;5 reps;Supine Toe Raises: AROM;Both;5 reps;Seated Other Exercises Other Exercises: bridging AROM (with socks) x 10; vc's for breathing        PT Goals Acute Rehab PT Goals Pt will go Sit to Supine/Side: with min assist PT Goal: Sit to Supine/Side - Progress: Partly met Pt will go Sit to Stand: with min assist PT Goal: Sit to Stand - Progress: Progressing toward goal Pt will go Stand to Sit: with min assist PT Goal: Stand to Sit - Progress: Partly met Pt will Ambulate: 16 - 50 feet;with least restrictive assistive device;with mod assist PT Goal: Ambulate - Progress: Met  Visit Information  Last PT Received On: 10/02/12 Assistance Needed: +2    Subjective Data  Subjective: "I'm a little afraid since there's just one of you to help me" Pt unable to stand with +1 assist; RN in to help Patient Stated Goal: To get stronger and go home   Cognition  Overall Cognitive Status: Appears within functional limits for tasks assessed/performed Arousal/Alertness: Awake/alert Behavior During Session: Anxious Awareness of Deficits: Pt recalled all restrictions (no pushing thru bil UEs--although required cues to maintain)    Balance  Static Sitting  Balance Static Sitting - Balance Support: Feet supported Static Sitting - Level of Assistance: 5: Stand by  assistance Static Standing Balance Static Standing - Balance Support: Bilateral upper extremity supported Static Standing - Level of Assistance: 4: Min assist  End of Session PT - End of Session Equipment Utilized During Treatment: Gait belt Activity Tolerance: Patient tolerated treatment well Patient left: in bed;with call bell/phone within reach Nurse Communication: Mobility status   GP     Ryan [name] 10/02/2012, 2:37 PM .Pager [phone]

## 2012-10-02 NOTE — Progress Notes (Signed)
I discussed with  Dr Bradshaw.  I agree with their plans documented in their progress note for today.  

## 2012-10-02 NOTE — Progress Notes (Signed)
The patient is maintaining normal sinus rhythm.  He is ambulating in the hall with assistance.  The monitor reveals no evidence of atrial fibrillation now for greater than 24 hours.  The cardiac exam reveals no rub or murmur.

## 2012-10-03 ENCOUNTER — Telehealth: Payer: Self-pay | Admitting: Surgery

## 2012-10-03 ENCOUNTER — Encounter (HOSPITAL_COMMUNITY)
Admission: EM | Disposition: A | Discharge status: Skilled Nursing Facility | Payer: Self-pay | Source: Home / Self Care | Attending: Family Medicine

## 2012-10-03 ENCOUNTER — Ambulatory Visit (HOSPITAL_COMMUNITY): Admit: 2012-10-03 | Payer: Self-pay | Admitting: Surgery

## 2012-10-03 DIAGNOSIS — Z79899 Other long term (current) drug therapy: Secondary | ICD-10-CM

## 2012-10-03 HISTORY — PX: ABDOMINAL AORTAGRAM: SHX5454

## 2012-10-03 LAB — CBC
HCT: 27.7 % — ABNORMAL LOW (ref 39.0–52.0)
Hemoglobin: 9.2 g/dL — ABNORMAL LOW (ref 13.0–17.0)
MCH: 24.6 pg — ABNORMAL LOW (ref 26.0–34.0)
MCHC: 33.2 g/dL (ref 30.0–36.0)
MCV: 74.1 fL — ABNORMAL LOW (ref 78.0–100.0)
Platelets: 320 10*3/uL (ref 150–400)
RBC: 3.74 MIL/uL — ABNORMAL LOW (ref 4.22–5.81)
RDW: 22.1 % — ABNORMAL HIGH (ref 11.5–15.5)
WBC: 9.8 10*3/uL (ref 4.0–10.5)

## 2012-10-03 LAB — GLUCOSE, CAPILLARY
Glucose-Capillary: 95 mg/dL (ref 70–99)
Glucose-Capillary: 102 mg/dL — ABNORMAL HIGH (ref 70–99)

## 2012-10-03 LAB — PROTIME-INR
INR: 1.46 (ref 0.00–1.49)
Prothrombin Time: 17.3 seconds — ABNORMAL HIGH (ref 11.6–15.2)

## 2012-10-03 LAB — POCT ACTIVATED CLOTTING TIME: Activated Clotting Time: 165 seconds

## 2012-10-03 LAB — HEPARIN LEVEL (UNFRACTIONATED): Heparin Unfractionated: 0.32 IU/mL (ref 0.30–0.70)

## 2012-10-03 SURGERY — ABDOMINAL AORTAGRAM
Anesthesia: LOCAL

## 2012-10-03 MED ORDER — OXYCODONE HCL 5 MG PO TABS
ORAL_TABLET | ORAL | Status: AC
  Filled 2012-10-03: qty 1

## 2012-10-03 MED ORDER — WARFARIN SODIUM 5 MG PO TABS
5.0000 mg | ORAL_TABLET | Freq: Once | ORAL | Status: DC
  Filled 2012-10-03: qty 1

## 2012-10-03 MED ORDER — FENTANYL CITRATE 0.05 MG/ML IJ SOLN
INTRAMUSCULAR | Status: AC
  Filled 2012-10-03: qty 2

## 2012-10-03 MED ORDER — ONDANSETRON HCL 4 MG/2ML IJ SOLN: 4.0000 mg | Freq: Four times a day (QID) | INTRAMUSCULAR | Status: DC | PRN

## 2012-10-03 MED ORDER — WARFARIN - PHARMACIST DOSING INPATIENT: Freq: Every day | Status: DC

## 2012-10-03 MED ORDER — WARFARIN - PHYSICIAN DOSING INPATIENT: Freq: Every day | Status: DC

## 2012-10-03 MED ORDER — HEPARIN (PORCINE) IN NACL 100-0.45 UNIT/ML-% IJ SOLN
1600.0000 [IU]/h | INTRAMUSCULAR | Status: DC
  Administered 2012-10-03 (×2): 1600 [IU]/h via INTRAVENOUS
  Filled 2012-10-03 (×3): qty 250

## 2012-10-03 MED ORDER — WARFARIN VIDEO
Freq: Once | Status: AC
  Administered 2012-10-03: 16:00:00

## 2012-10-03 MED ORDER — SODIUM CHLORIDE 0.9 % IV SOLN
INTRAVENOUS | Status: DC
  Administered 2012-10-03: 12:00:00 via INTRAVENOUS

## 2012-10-03 MED ORDER — MIDAZOLAM HCL 2 MG/2ML IJ SOLN
INTRAMUSCULAR | Status: AC
  Filled 2012-10-03: qty 2

## 2012-10-03 MED ORDER — LIDOCAINE HCL (PF) 1 % IJ SOLN
INTRAMUSCULAR | Status: AC
Start: 2012-10-03 — End: 2012-10-03
  Filled 2012-10-03: qty 30

## 2012-10-03 MED ORDER — ACETAMINOPHEN 650 MG RE SUPP: 325.0000 mg | RECTAL | Status: DC | PRN

## 2012-10-03 MED ORDER — COUMADIN BOOK
Freq: Once | Status: AC
  Administered 2012-10-03: 16:00:00
  Filled 2012-10-03: qty 1

## 2012-10-03 MED ORDER — WARFARIN SODIUM 5 MG PO TABS
5.0000 mg | ORAL_TABLET | ORAL | Status: AC
  Administered 2012-10-03: 5 mg via ORAL
  Filled 2012-10-03: qty 1

## 2012-10-03 MED ORDER — ACETAMINOPHEN 325 MG PO TABS: 325.0000 mg | ORAL_TABLET | ORAL | Status: DC | PRN

## 2012-10-03 MED ORDER — AMIODARONE HCL 200 MG PO TABS
200.0000 mg | ORAL_TABLET | Freq: Every day | ORAL | Status: DC
  Administered 2012-10-03 – 2012-10-06 (×4): 200 mg via ORAL
  Filled 2012-10-03 (×4): qty 1

## 2012-10-03 MED ORDER — HEPARIN (PORCINE) IN NACL 2-0.9 UNIT/ML-% IJ SOLN
INTRAMUSCULAR | Status: AC
  Filled 2012-10-03: qty 1000

## 2012-10-03 NOTE — Progress Notes (Signed)
Patient Name: Ryan [name] Date of Encounter: 10/03/2012    SUBJECTIVE: The patient feels better. He denies dyspnea.  TELEMETRY:  Normal sinus rhythm with first-degree AV block: Filed Vitals:   10/02/12 1449 10/02/12 2044 10/02/12 2210 10/03/12 0429  BP: 111/60 119/64 125/67 119/65  Pulse: 82 89 85 86  Temp: 98.5 F (36.9 C) 98.2 F (36.8 C)  97.6 F (36.4 C)  TempSrc: Oral Oral  Oral  Resp: 16 18  18   Height:      Weight:    84.8 kg (186 lb 15.2 oz)  SpO2: 98% 94%  93%    Intake/Output Summary (Last 24 hours) at 10/03/12 0825 Last data filed at 10/03/12 0317  Gross per 24 hour  Intake 1382.68 ml  Output    625 ml  Net 757.68 ml    LABS: Basic Metabolic Panel:  Basename 10/01/12 0655  NA 136  K 3.7  CL 100  CO2 24  GLUCOSE 95  BUN 14  CREATININE 0.79  CALCIUM 8.1*  MG --  PHOS --   CBC:  Basename 10/03/12 0500 10/02/12 0630  WBC 9.8 12.1*  NEUTROABS -- --  HGB 9.2* 8.9*  HCT 27.7* 26.6*  MCV 74.1* 73.5*  PLT 320 275  Radiology/Studies:  No new data  Physical Exam: Blood pressure 119/65, pulse 86, temperature 97.6 F (36.4 C), temperature source Oral, resp. rate 18, height 6' (1.829 m), weight 84.8 kg (186 lb 15.2 oz), SpO2 93.00%. Weight change:    No murmur is heard, no rub is heard.  ASSESSMENT:  1. Atrial fibrillation has not recurred in the last 72 hours  2. Amiodarone loading now for greater than 3 weeks  3. 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 Decrease amiodarone to 200 mg daily.  Demetrios Isaacs 10/03/2012, 8:25 AM

## 2012-10-03 NOTE — Interval H&P Note (Signed)
History and Physical Interval Note:  10/03/2012 8:30 AM  Ryan [name]  has presented today for surgery, with the diagnosis of pvd  The various methods of treatment have been discussed with the patient and family. After consideration of risks, benefits and other options for treatment, the patient has consented to  Procedure(s) (LRB) with comments: ABDOMINAL AORTAGRAM (N/A) as a surgical intervention .  The patient's history has been reviewed, patient examined, no change in status, stable for surgery.  I have reviewed the patient's chart and labs.  Questions were answered to the patient's satisfaction.     BRABHAM IV, V. WELLS

## 2012-10-03 NOTE — Progress Notes (Signed)
I discussed with  Dr Bradshaw.  I agree with their plans documented in their progress note for today.  

## 2012-10-03 NOTE — Clinical Social Work Note (Signed)
CSW referred to Pt to assist with dc planning, likely SNF d/t to Pt's needs and insurance.  CSW discussed briefly with Pt while he was resting. Pt gave CSW permission to discuss with wife and begin search for availability. CSW will f/u with Pt's wife to complete psychosocial assessment and begin SNF placement process. Pt's insurance requires preauthorization for SNF.   Chrys Racer, [place]

## 2012-10-03 NOTE — Op Note (Signed)
[facility]  Patient name: Ryan [name] MRN: [id number] DOB: 1946/01/24 Sex: male  09/12/2012 - 10/03/2012 Pre-operative Diagnosis: Right foot wound Post-operative diagnosis:  Same Surgeon:  Eldridge Abrahams Procedure Performed:  1.  ultrasound access left common femoral artery  2.  abdominal aortogram  3.  bilateral lower extremity runoff  4.  second order catheterization    Indications:  The patient initially presented with concerns for an ischemic right foot. He went on to develop infectious issues not related to circulation of his right foot. This required removal of hardware from his right foot and ultimately valve replacement do to endocarditis. The patient has developed a dark eschar over his incision in his foot which was used to remove his hardware. He has known vascular insufficiency. He comes in today for further evaluation and possible treatment.  Procedure:  The patient was identified in the holding area and taken to room 8.  The patient was then placed supine on the table and prepped and draped in the usual sterile fashion.  A time out was called.  Ultrasound was used to evaluate the left common femoral artery.  It was patent .  A digital ultrasound image was acquired.  A micropuncture needle was used to access the left common femoral artery under ultrasound guidance.  An 018 wire was advanced without resistance and a micropuncture sheath was placed.  The 018 wire was removed and a benson wire was placed.  The micropuncture sheath was exchanged for a 5 french sheath.  An omniflush catheter was advanced over the wire to the level of L-1.  An abdominal angiogram was obtained.  Next, using the omniflush catheter and a benson wire, the aortic bifurcation was crossed and the catheter was placed into theright external iliac artery and right runoff was obtained.  left runoff was performed via retrograde sheath injections.  Findings:   Aortogram:  The  visualized portions of the suprarenal abdominal aorta showed no significant stenosis. There is no evidence of renal artery stenosis. The infrarenal abdominal aorta is widely patent. Bilateral common, external, and internal iliac arteries are widely patent.  Right Lower Extremity:  The right common femoral artery is widely patent. The right profunda femoral artery is widely patent the right superficial femoral artery is widely patent. There are narrowing in the proximal popliteal artery that did not appear to be significant. The popliteal artery occludes just as it crosses the knee. There is reconstitution of the anterior tibial artery which then proceeds to occlude in the distal leg. The peroneal artery reconstitutes 12 cm distal to the initial occlusion. This is the dominant vessel across the ankle via collaterals.  Left Lower Extremity:  The common femoral artery is widely patent. The profunda femoral artery is widely patent. The superficial femoral artery is patent throughout it's course. At the adductor canal there is slight narrowing of approximately 30-40% for a short distance. The popliteal artery is patent throughout it's course. The posterior tibial and anterior tibial arteries are occluded. The dominant runoff is the peroneal artery. There is limited visualization of the peroneal collaterals on the foot.  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 The peroneal artery is the single vessel runoff. This would be a very complicated endovascular recanalization, particularly given the fact that he only has one vessel runoff. I made the decision to not proceed with intervention, but rather to  evaluate the patient's foot wound in 2-3 weeks. At that time, if he is still dealing with non-healing issues, consideration would be made for recanalization of his occluded popliteal artery versus above-knee popliteal  artery to peroneal artery bypass graft.  #2  single vessel runoff on the left via the peroneal artery   V. Annamarie Major, M.D. [facility] Office: [phone] Pager:  [phone]

## 2012-10-03 NOTE — Progress Notes (Signed)
ANTICOAGULATION CONSULT NOTE - Initial Consult  Pharmacy Consult for coumadin (Pharmacy also dosing Heparin) Indication: DVT  No Known Allergies  Patient Measurements: Height: 6' (182.9 cm) Weight: 186 lb 15.2 oz (84.8 kg) IBW/kg (Calculated) : 77.6  Heparin Dosing Weight: 87 kg  Vital Signs: BP: 125/65 mmHg (01/14 1444) Pulse Rate: 86  (01/14 1444)  Labs:  Basename 10/03/12 0500 10/02/12 0630 10/01/12 1931 10/01/12 0655  HGB 9.2* 8.9* -- --  HCT 27.7* 26.6* -- 25.7*  PLT 320 275 -- 274  APTT -- -- -- --  LABPROT -- -- -- --  INR -- -- -- --  HEPARINUNFRC 0.32 0.41 0.47 --  CREATININE -- -- -- 0.79  CKTOTAL -- -- -- --  CKMB -- -- -- --  TROPONINI -- -- -- --    Estimated Creatinine Clearance: 97 ml/min (by C-G formula based on Cr of 0.79).   Assessment: Pt well known to pharmacy from Heparin dosing for B/L DVT. MD wants no bolus given recent surgery; coumadin (per pharmacy) now to be resumed s/p angiogram on 1/14. Hgb stable. INR today 1.46. Pt also on amiodarone which can potentiate the effects of warfarin.  Goal of Therapy:  INR 2-3 Heparin level 0.3-0.7 units/ml Monitor platelets by anticoagulation protocol: Yes   Plan:  1) Continue heparin at 1600 units/hr 2) Daily INR, heparin level, CBC 3) Coumadin 5 mg po tonight  Sherlon Handing, PharmD, BCPS Clinical pharmacist, pager [phone] 10/03/2012,4:33 PM

## 2012-10-03 NOTE — Telephone Encounter (Addendum)
Message copied by Doristine Section on Tue Oct 03, 2012 11:47 AM ------      Message from: Alfonso Patten      Created: Tue Oct 03, 2012 10:42 AM                   ----- Message -----         From: [name], MD         Sent: 10/03/2012   9:59 AM           To: Patrici Ranks, Alfonso Patten, RN            10/03/2012:              1.  ultrasound access left common femoral artery       2.  abdominal aortogram       3.  bilateral lower extremity runoff       4.  second order catheterization            I need the patient to see me in the office on Monday, January 27  notified patient of fu appt. with vwb on 10-16-12 1:30 unable to reach patient by phone mailed appt. letter 10-03-12

## 2012-10-03 NOTE — Progress Notes (Signed)
ANTICOAGULATION CONSULT NOTE - Follow Up Consult  Pharmacy Consult for heparin Indication: DVT  No Known Allergies  Patient Measurements: Height: 6' (182.9 cm) Weight: 186 lb 15.2 oz (84.8 kg) IBW/kg (Calculated) : 77.6  Heparin Dosing Weight: 87 kg  Vital Signs: Temp: 97.6 F (36.4 C) (01/14 0429) Temp src: Oral (01/14 0429) BP: 119/65 mmHg (01/14 0429) Pulse Rate: 86  (01/14 0429)  Labs:  Basename 10/03/12 0500 10/02/12 0630 10/01/12 1931 10/01/12 0655  HGB 9.2* 8.9* -- --  HCT 27.7* 26.6* -- 25.7*  PLT 320 275 -- 274  APTT -- -- -- --  LABPROT -- -- -- --  INR -- -- -- --  HEPARINUNFRC 0.32 0.41 0.47 --  CREATININE -- -- -- 0.79  CKTOTAL -- -- -- --  CKMB -- -- -- --  TROPONINI -- -- -- --    Estimated Creatinine Clearance: 97 ml/min (by C-G formula based on Cr of 0.79).   Medications:  Scheduled:     . amiodarone  200 mg Oral Daily  . amLODipine  2.5 mg Oral Daily  . atorvastatin  40 mg Oral q1800  .  ceFAZolin (ANCEF) IV  2 g Intravenous Q8H  . colchicine  0.6 mg Oral BID  . docusate sodium  200 mg Oral Daily  . feeding supplement  237 mL Oral BID BM  . feeding supplement  30 mL Oral BID  . iron polysaccharides  150 mg Oral Daily  . metoprolol tartrate  12.5 mg Oral BID  . pantoprazole  40 mg Oral QAC breakfast  . sodium chloride  3 mL Intravenous Q12H  . [DISCONTINUED] amiodarone  200 mg Oral BID  . [DISCONTINUED] feeding supplement  1 Container Oral TID WC   Infusions:     . heparin 1,600 Units/hr (10/02/12 2306)    Assessment: 67yo male sent to ED from OSH for concerns for ischemic leg w/ 2d h/o pain, vascular exam shows chronic vascular dz. Noted to have AMS, to begin IV ABX for pyelonephritis/ urosepsis on admit.   AC: Heparin for ischemic leg d/c'd 12/27 2/2 suspected pericarditis. Now with B DVT requiring heparin, MD wants no bolus given recent surgery; coumadin on hold for angiogram on 1/14. Heparin level 0.32. Hgb stable. Afib now  NSR   Goal of Therapy:  Heparin level 0.3-0.7 units/ml Monitor platelets by anticoagulation protocol: Yes   Plan:  1) Continue heparin at 1600 units/hr 2) Angiogram for PVD   Ryan [name], PharmD, BCPS Clinical Staff Pharmacist Pager [phone]  Ryan [name] 10/03/2012,8:38 AM

## 2012-10-03 NOTE — Progress Notes (Signed)
Occupational Therapy Treatment Patient Details Name: Ryan [name] MRN: [id number] DOB: 1945/10/14 Today's Date: 10/03/2012 Time: [id number] OT Time Calculation (min): 21 min  OT Assessment / Plan / Recommendation Comments on Treatment Session Improved ROM from last night. Discussed importance of diligently performting ROM exercises to regain funcitonal use of hand. Sister in law present. Will give pt written exercise program. Will most likely need to splint wrist into extension. Will discuss with MD.    Follow Up Recommendations  SNF would benefit from CIR   Barriers to Discharge   none    Equipment Recommendations  3 in 1 bedside comode;Tub/shower bench    Recommendations for Other Services  none  Frequency Min 4X/week   Plan Discharge plan remains appropriate    Precautions / Restrictions Precautions Precautions: Fall;Sternal Restrictions RLE Weight Bearing: Weight bearing as tolerated   Pertinent Vitals/Pain premedicated    ADL  ADL Comments: Focus of session on R hand/wrist rehab. heat to R digits followed by gentle passive streth into composite flexion. Ace wrapped into compoiste flexion. Will continue in 2 nd session.; Sister in las present for session. Stressed importance of pt deligently completing exercises with R hand to regain functional use of hand.     OT Diagnosis:    OT Problem List:   OT Treatment Interventions:     OT Goals Acute Rehab OT Goals OT Goal Formulation: With patient/family ADL Goals Pt Will Perform Upper Body Bathing: with supervision;Sitting, chair;Sitting, edge of bed Pt Will Perform Lower Body Bathing: with mod assist;Sit to stand from bed;with adaptive equipment Pt Will Perform Upper Body Dressing: with supervision;Unsupported;Sitting, bed Pt Will Perform Lower Body Dressing: with mod assist;Unsupported;Sit to stand from bed Pt Will Transfer to Toilet: with min assist;Ambulation;with DME;Comfort height toilet Pt Will Perform Toileting -  Clothing Manipulation: with mod assist;Sitting on 3-in-1 or toilet;Standing Pt Will Perform Toileting - Hygiene: with mod assist;Sit to stand from 3-in-1/toilet Miscellaneous OT Goals Miscellaneous OT Goal #1: Pt will independently perform right hand AROM/AAROM 2-3 x daily. OT Goal: Miscellaneous Goal #1 - Progress: Progressing toward goals Miscellaneous OT Goal #2: Pt will independently perform neck AROM 2-3 x daily. OT Goal: Miscellaneous Goal #2 - Progress: Progressing toward goals Miscellaneous OT Goal #3: Pt will independently maintain sternal precautions during functional mobility. OT Goal: Miscellaneous Goal #3 - Progress: Progressing toward goals  Visit Information  Last OT Received On: 10/03/12    Subjective Data      Prior Functioning       Cognition       Mobility  Shoulder Instructions         Exercises  Shoulder Exercises Digit Composite Flexion: PROM;AAROM;Right;10 reps;Seated Composite Extension: PROM;AAROM;Right;10 reps Hand Exercises Wrist Flexion: PROM;AAROM;Right;10 reps Wrist Extension: PROM;AAROM;Right;10 reps Wrist Ulnar Deviation: PROM;AAROM;Right;5 reps Wrist Radial Deviation: PROM;AAROM;Right;5 reps   Balance     End of Session OT - End of Session Activity Tolerance: Patient tolerated treatment well Patient left: in chair;with call bell/phone within reach Nurse Communication: Mobility status  GO     Dayanna Pryce,HILLARY 10/03/2012, 5:14 PM [facility], OTR/L  [phone] 10/03/2012

## 2012-10-03 NOTE — Progress Notes (Signed)
                    [address]            [place] [address]          [phone]     7 Days Post-Op Procedure(s) (LRB): MITRAL VALVE (MV) REPLACEMENT (N/A) INTRAOPERATIVE TRANSESOPHAGEAL ECHOCARDIOGRAM (N/A)  Subjective: Ate better last night but rested poorly.  Feels ok in general.   Objective: Vital signs in last 24 hours: Patient Vitals for the past 24 hrs:  BP Temp Temp src Pulse Resp SpO2 Weight  10/03/12 0429 119/65 mmHg 97.6 F (36.4 C) Oral 86  18  93 % 186 lb 15.2 oz (84.8 kg)  10/02/12 2210 125/67 mmHg - - 85  - - -  10/02/12 2044 119/64 mmHg 98.2 F (36.8 C) Oral 89  18  94 % -  10/02/12 1449 111/60 mmHg 98.5 F (36.9 C) Oral 82  16  98 % -  10/02/12 1101 131/66 mmHg - - 92  - - -    Current Weight  10/03/12 186 lb 15.2 oz (84.8 kg)  PRE-OPERATIVE WEIGHT: 92 kg   Intake/Output from previous day: 01/13 0701 - 01/14 0700 In: 1382.7 [P.O.:240; I.V.:1142.7] Out: 625 [Urine:625]    PHYSICAL EXAM:  Heart: RRR Lungs: Clear Wound: Clean and dry Extremities: Mild bilateral LE edema     Lab Results: CBC: Basename 10/03/12 0500 10/02/12 0630  WBC 9.8 12.1*  HGB 9.2* 8.9*  HCT 27.7* 26.6*  PLT 320 275   BMET:  Basename 10/01/12 0655  NA 136  K 3.7  CL 100  CO2 24  GLUCOSE 95  BUN 14  CREATININE 0.79  CALCIUM 8.1*    PT/INR: No results found for this basename: LABPROT,INR in the last 72 hours    Assessment/Plan: S/P Procedure(s) (LRB): MITRAL VALVE (MV) REPLACEMENT (N/A) INTRAOPERATIVE TRANSESOPHAGEAL ECHOCARDIOGRAM (N/A) CV- previous AF, now maintaining SR. BPs stable. Continue Amio, Lopressor, Norvasc.  Bilat LE DVT- Continue Heparin. Coumadin on hold for angiogram. Endocarditis/MSSA bacteremia- continue Ancef x 6 weeks total postop. ID following.  PVD- Angio today by Dr. Trula Slade.  Vol overload- continue diuresis.  CRPI, continue pulm toilet.   LOS: 21 days    COLLINS,GINA H 10/03/2012

## 2012-10-03 NOTE — Progress Notes (Signed)
Occupational Therapy Treatment Patient Details Name: Ryan [name] MRN: [id number] DOB: [birth year] Today's Date: 10/03/2012 Time: [id number] OT Time Calculation (min): 25 min  OT Assessment / Plan / Recommendation Comments on Treatment Session Excellent session. Increased ROM after use of heat and sustained passive stretch R digits. Pt @ 1 cm from touching palm with all digits. Pt fed self with R hand using built up grip for first time in a month. Discussed importance of encouraging use of R hand with staff. Will follow tomorrow and discuss fabrication of wrist splint with MD.     Follow Up Recommendations  SNF    Barriers to Discharge   insurance    Equipment Recommendations  3 in 1 bedside comode;Tub/shower bench    Recommendations for Other Services  none  Frequency Min 4X/week   Plan Discharge plan remains appropriate    Precautions / Restrictions Precautions Precautions: Fall;Sternal Restrictions RLE Weight Bearing: Weight bearing as tolerated   Pertinent Vitals/Pain States pain is tolerable during ROM    ADL  Eating/Feeding: Minimal assistance;Other (comment) (with R hand) Where Assessed - Eating/Feeding: Chair ADL Comments: gentle passive flexion into compsite flexion/extension followed by single digit ROM. Gentle ant/post jt mobs  IP and MP joints. Marland Kitchen Ab/adduction of digits. finger lifts. squeeze ball. Adapted utensil with built up grip. angled fork due to lack of wrist ROM. Pt able to self feed with built up utensil with min set up. Excellent progress wi9th R hand.    OT Diagnosis:    OT Problem List:   OT Treatment Interventions:     OT Goals Acute Rehab OT Goals OT Goal Formulation: With patient/family Time For Goal Achievement: 10/13/12 Potential to Achieve Goals: Good ADL Goals Pt Will Perform Eating: with set-up;Sitting, chair;with adaptive utensils;Unsupported;Other (comment) (using R  dominant hand for 90% of meal) ADL Goal: Eating - Progress: Goal set  today Pt Will Perform Upper Body Bathing: with supervision;Sitting, chair;Sitting, edge of bed Pt Will Perform Lower Body Bathing: with mod assist;Sit to stand from bed;with adaptive equipment Pt Will Perform Upper Body Dressing: with supervision;Unsupported;Sitting, bed Pt Will Perform Lower Body Dressing: with mod assist;Unsupported;Sit to stand from bed Pt Will Transfer to Toilet: with min assist;Ambulation;with DME;Comfort height toilet Pt Will Perform Toileting - Clothing Manipulation: with mod assist;Sitting on 3-in-1 or toilet;Standing Pt Will Perform Toileting - Hygiene: with mod assist;Sit to stand from 3-in-1/toilet Arm Goals Pt Will Complete Theraputty Exer: with supervision, verbal cues required/provided;to increase strength;Right upper extremity;Min resistance putty;Other (comment) (R hand) Arm Goal: Theraputty Exercises - Progress: Goal set today Additional Arm Goal #1: Pt will demonstrate full composite flexion R hand AAROM to increase independence with ADL Arm Goal: Additional Goal #1 - Progress: Goal set today Additional Arm Goal #2: Pt will demonstrate opposition to 4th digit AROM to increase independence with ADL. Arm Goal: Additional Goal #2 - Progress: Goal set today Miscellaneous OT Goals Miscellaneous OT Goal #1: Pt will independently perform right hand AROM/AAROM 2-3 x daily. OT Goal: Miscellaneous Goal #1 - Progress: Progressing toward goals Miscellaneous OT Goal #2: Pt will independently perform neck AROM 2-3 x daily. OT Goal: Miscellaneous Goal #2 - Progress: Progressing toward goals Miscellaneous OT Goal #3: Pt will independently maintain sternal precautions during functional mobility. OT Goal: Miscellaneous Goal #3 - Progress: Progressing toward goals  Visit Information  Last OT Received On: 10/03/12    Subjective Data   This is the first time I've fed myself with this hand in a month   Prior  Functioning    indepenent   Cognition        Mobility  Shoulder Instructions         Exercises  Shoulder Exercises Digit Composite Flexion: AROM;AAROM;Self ROM;Right;10 reps Composite Extension: AROM;PROM;AAROM;Self ROM;Right;5 reps Hand Exercises Forearm Supination: PROM;AAROM;Right;10 reps Forearm Pronation: PROM;AAROM;Right;10 reps Wrist Flexion: PROM;AAROM;Right;10 reps Wrist Extension: PROM;AAROM;Right;10 reps Wrist Ulnar Deviation: AAROM;Right;5 reps Wrist Radial Deviation: AAROM;Right;5 reps Digit Lifts: Self ROM;Right;5 reps Thumb Abduction: AROM;Right;10 reps Thumb Adduction: AROM;Right;10 reps Opposition: AROM;Right;5 reps;Other (comment) (able to oppose thumb to index and middle after session)   Balance     End of Session OT - End of Session Activity Tolerance: Patient tolerated treatment well Patient left: in chair;with call bell/phone within reach Nurse Communication: Other (comment) (need to encourage use R hand)  GO     Blondie Riggsbee,HILLARY 10/03/2012, 5:53 PM [facility], OTR/L  [phone] 10/03/2012

## 2012-10-03 NOTE — Progress Notes (Signed)
Family Medicine Teaching Service Daily Progress Note Service Page: [phone]  Patient Assessment: 67 y.o. year old male with acute encephalopathy (now resolved) secondary to sepsis from multiple sources, s/p I&D of R wrist and R MTP lesion (with HW removal), Multiple small R hemisphere infarcts that are likely septic emboli, C6-T1 Diskitis, pericarditis,  Endocarditis (s/p MVR), and E. Coli UTI (s/p treatment). Now s/p mitral valve replacement on 1/7.  Subjective:  Feeling well this am, sleepy. Pain controlled well. No dyspnea, some chest pain consistent with heartburn pain in the past last night now resolved.   Objective: Temp:  [97.6 F (36.4 C)-98.5 F (36.9 C)] 97.6 F (36.4 C) (01/14 0429) Pulse Rate:  [82-92] 90  (01/14 0850) Resp:  [16-18] 18  (01/14 0429) BP: (111-131)/(60-67) 126/61 mmHg (01/14 0850) SpO2:  [93 %-98 %] 94 % (01/14 0850) Weight:  [186 lb 15.2 oz (84.8 kg)] 186 lb 15.2 oz (84.8 kg) (01/14 0429)  Exam: Gen: NAD, alert, cooperative with exam, sleeping comfortably in bed HEENT: NCAT, EOMI, MMM Neck: no tenderness to palpation of Cervical spine, improved ROM compared to 1/11  CV: RRR, good S1/S2 with additional S3 Resp: CTABL, no wheezes, non-labored Abd: Soft, NTND, + BS Ext: 1+ pitting edema on RLE, warm; dressing in place to right wrist/hand and right food-clean/dry/intact Neuro: Alert, oriented/appropriate in conversation, face symmetric;  Speaks easily  I have reviewed the patient's medications, labs, imaging, and diagnostic testing.  Notable results are summarized below.  CBC BMET   Lab 10/03/12 0500 10/02/12 0630 10/01/12 0655  WBC 9.8 12.1* 11.7*  HGB 9.2* 8.9* 8.5*  HCT 27.7* 26.6* 25.7*  PLT 320 275 274    Lab 10/01/12 0655 09/30/12 0511 09/29/12 0510  NA 136 137 135  K 3.7 4.0 4.0  CL 100 102 101  CO2 24 26 25   BUN 14 20 27*  CREATININE 0.79 0.84 1.02  GLUCOSE 95 81 101*  CALCIUM 8.1* 7.9* 8.1*    Blood Culture 12/26- No growth-  final Blood culture 12/30- No growth- final C. Diff 1/4 - negative  Imaging/Diagnostic Tests: TEE 09/18/2012 - The estimated ejection fraction was in the range of 60% to 65%. - Aortic valve: No evidence of vegetation. - Mitral valve: Large 2.5cm diameter vegetation - Left atrium: No evidence of thrombus in the atrial cavity or appendage. - Right atrium: No evidence of thrombus in the atrial cavity or appendage. - Tricuspid valve: No evidence of vegetation. - Pulmonic valve: No evidence of vegetation. - Pericardium, extracardiac: A small, free-flowing pericardial effusion was identified along the right atrial free wall.  MRI Head 12/27 Several small areas of acute infarction involving the right  hemisphere including portions of the; right occipital lobe, right  temporal lobe, right opercular region, posterior right frontal lobe  and right parietal lobe.  MRI Cervical Spine 12/27 C6-7 diskitis with spread of infection to the adjacent facet  joints, spinous process and interspinous ligaments/posterior  paraspinal musculature as discussed above. Epidural abscess  suspected although evaluation limited without contrast.  CXR 09/26/2012 IMPRESSION:  Postoperative changes with support apparatus as described.  Left pleural effusion and bibasilar atelectasis - no evidence of  pneumothorax.  CXR 09/27/2012 IMPRESSION:  1. Somewhat low lung volumes with bibasilar air space disease,  left greater than right, and probable small left pleural effusion.  2. No definite pneumothorax.  Venous duplex 09/28/2012 Summary: Findings consistent with deep vein thrombosis involving the right mid femoral vein (behind a valve leaflet), the right gastrocnemius vein  and the left gastrocnemius vein.   Plan: Ryan [name] is a 67 y.o. year old male with acute encephalopathy secondary to sepsis from multiple sources.  Now post-op from mitral valve replacement on 09/26/2012. VVS planning andio and possible  intervention today.   PAD/Ischemic limb:  - Limb warm - Vascular on board- appreciate reccomendations - concern on admission that LE was ischemic.  - R ABI 0.48 on 1/2/214, L ABI WNL - Vascular plans to perform LE angio today and intervene as necessary  Post-op Anemia - Hgb dropped to 7.1 on 1/11, now stable at 9.2 post transfusion 2 units PRBC on 1/11 - fecal occult blood negative  DVT - BL DVT likely from bedbound state since admission - Heparin drip per Dr. Cyndia Bent  - Hold on transitioning to PO anticoagulation due to probable need for vascular surgery next Tuesday (1/14) per Dr. Stephens Shire request  - monitor  CV- Stable off pressors - s/p Mitral valve replacement day 7 - Cardiology and CTS on board, greatly appreciate recommendations - Lasix per cards/CTS, 40mg  PO for 3 days- dyspnea and fluid status improved, + 1.9L for admission - Heart cath on 09/21/2012, Findings:  - Widely patent coronary arteries, pulmonary HTN with PCWP of 11mmHg  - Post-op pain- PRN oxycodone and tramadol   Afib with RVR- Pericarditis - Afib likely due to pericarditis, pericarditis improving - causing Afib, currently in NSR  with amiodarone - continue per cardiology, Amiodarone 200  Mg daily - TEE on 12/30 confirmed MV vegetation listed below and slight improvement of pericarditis  Acute Diastolic Heart failure - will likely improve s/p MVR (done 1/7), improving evidenced by dyspnea  - secondary to mitral regurgitation caused by large vegetation now replaced - Lasix per cards/CTS- 40mg  PO for 3 days - Strict I/O, daily weights - UOP 1600 mL, Positive 1.1 for admission today, was 2.6 L on 1/12  Resp-  - stable on RA - no complaints of dyspnea currently  - CXR with Small L pleural effusion, chest tube placed by CTS 1/8 and removed 1/9  ID - metastatic infection - ID on Board-   - Continue Ancef, likely will need 6-8 weeks  - per Dr. 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  - Resolved, follow daily BMET  Elevated Transaminases  - Pt AST:ALT 2:1 consistent with alcoholic picture.  EtOH on admission < 11  - consider recheck LFT's  Newly Dx DM II - A1C 6.5 on check  - Insulin drip 1/8, transitioned to subq insulin post op, now monitoring CBGs and SSI if needed - CBG's 90-119 - Consider sensitive SSI if starts to develop hyperglycemia, Blood glucose fine for now.   FEN/GI:  - Carb modified diet  - KVO NS - Strict I/O  Prophylaxis: SCD Disposition: Step down for now, CIR vs SNF with PT after workup.   Kenn File, MD 10/03/2012, 9:10 AM

## 2012-10-03 NOTE — H&P (View-Only) (Signed)
[facility]  Subjective  -   No complaints today Family at bedside Resting comfortably   Physical Exam:  CV:  RRR Pulm:  CTAB Ext:  Right foot incision with black eschar without evidence of infection       Assessment/Plan:  Events of this hospitalization noted Discussed with the family that he may have difficulty healing his right leg incision without revascularization.  I will plan on proceeding with angiography this coming Tuesday.  At that time, if he has options for percutaneoous revascularization, I will proceed at that time.  If he needs surgical revascularization, if at all possible, I would like to give him some time to recover from his cardiac surgery.  The timing of bypass will need to be dica=tated by the appearance of his right foot.  I will see him again on Monday  Would hold off on oral anticoagulation until after his angiogram   BRABHAM IV, V. WELLS 09/29/2012 5:09 PM --  Filed Vitals:   09/29/12 1421  BP: 131/59  Pulse: 96  Temp: 97.1 F (36.2 C)  Resp: 18    Intake/Output Summary (Last 24 hours) at 09/29/12 1709 Last data filed at 09/29/12 1305  Gross per 24 hour  Intake   1029 ml  Output   1500 ml  Net   -471 ml     Laboratory CBC    Component Value Date/Time   WBC 14.0* 09/29/2012 0510   HGB 8.1* 09/29/2012 0510   HCT 23.9* 09/29/2012 0510   PLT 283 09/29/2012 0510    BMET    Component Value Date/Time   NA 135 09/29/2012 0510   K 4.0 09/29/2012 0510   CL 101 09/29/2012 0510   CO2 25 09/29/2012 0510   GLUCOSE 101* 09/29/2012 0510   BUN 27* 09/29/2012 0510   CREATININE 1.02 09/29/2012 0510   CALCIUM 8.1* 09/29/2012 0510   GFRNONAA 73* 09/29/2012 0510   GFRAA 85* 09/29/2012 0510    COAG Lab Results  Component Value Date   INR 1.77* 09/26/2012   INR 1.21 09/21/2012   INR 1.25 09/13/2012   No results found for this basename: PTT    Antibiotics Anti-infectives     Start     Dose/Rate Route Frequency  Ordered Stop   09/26/12 2200   cefUROXime (ZINACEF) 1.5 g in dextrose 5 % 50 mL IVPB  Status:  Discontinued        1.5 g 100 mL/hr over 30 Minutes Intravenous Every 12 hours 09/26/12 1331 09/27/12 0830   09/26/12 2000   vancomycin (VANCOCIN) IVPB 1000 mg/200 mL premix        1,000 mg 200 mL/hr over 60 Minutes Intravenous  Once 09/26/12 1331 09/26/12 2053   09/26/12 0600   cefUROXime (ZINACEF) 750 mg in dextrose 5 % 50 mL IVPB  Status:  Discontinued        750 mg 100 mL/hr over 30 Minutes Intravenous  Once 09/25/12 1649 09/26/12 0517   09/26/12 0400   vancomycin (VANCOCIN) 1,500 mg in sodium chloride 0.9 % 250 mL IVPB        1,500 mg 125 mL/hr over 120 Minutes Intravenous To Surgery 09/25/12 1512 09/26/12 0800   09/26/12 0400   cefUROXime (ZINACEF) 1.5 g in dextrose 5 % 50 mL IVPB        1.5 g 100 mL/hr over 30 Minutes Intravenous To Surgery 09/25/12 1512 09/26/12 1211   09/26/12 0400   cefUROXime (ZINACEF) 750 mg in dextrose  5 % 50 mL IVPB  Status:  Discontinued        750 mg 100 mL/hr over 30 Minutes Intravenous To Surgery 09/25/12 1727 09/26/12 1222   09/16/12 0800   fluconazole (DIFLUCAN) tablet 150 mg        150 mg Oral  Once 09/16/12 0619 09/16/12 1204   09/15/12 1400   ceFAZolin (ANCEF) IVPB 2 g/50 mL premix        2 g 100 mL/hr over 30 Minutes Intravenous 3 times per day 09/15/12 1110     09/13/12 1200   vancomycin (VANCOCIN) 750 mg in sodium chloride 0.9 % 150 mL IVPB  Status:  Discontinued        750 mg 150 mL/hr over 60 Minutes Intravenous Every 12 hours 09/13/12 0409 09/15/12 1110   09/13/12 0800   piperacillin-tazobactam (ZOSYN) IVPB 3.375 g  Status:  Discontinued        3.375 g 12.5 mL/hr over 240 Minutes Intravenous Every 8 hours 09/13/12 0409 09/15/12 1110   09/12/12 2300   vancomycin (VANCOCIN) IVPB 1000 mg/200 mL premix        1,000 mg 200 mL/hr over 60 Minutes Intravenous  Once 09/12/12 2253 09/13/12 0128   09/12/12 2300  piperacillin-tazobactam (ZOSYN)  IVPB 3.375 g       3.375 g 12.5 mL/hr over 240 Minutes Intravenous  Once 09/12/12 2253 09/13/12 0023           V. Leia Alf, M.D. [facility] Office: [phone] Pager:  [phone]

## 2012-10-03 NOTE — Progress Notes (Addendum)
CARDIAC REHAB PHASE I   PRE:  Rate/Rhythm: 86 SR    BP: sitting 126/65    SaO2: 90-91 RA  MODE:  Ambulation: 150 ft   POST:  Rate/Rhythm: 109 ST    BP: sitting 120/63     SaO2: 85 RA, up to 91 RA with rest  Pt stronger today. Wanted to sit and rest but able to be encouraged to keep walking. Able to ambulate with platform walker, assist x2, gait belt. On return to room pt dyspneic. SaO2 85 Ra. This is new, as he normally is in high 90s. SaO2 up to 91 with rest and deep breathing. Had large liquid BM before walk. Will f/u. [id number]    Darrick Meigs CES, ACSM

## 2012-10-04 LAB — GLUCOSE, CAPILLARY
Glucose-Capillary: 100 mg/dL — ABNORMAL HIGH (ref 70–99)
Glucose-Capillary: 103 mg/dL — ABNORMAL HIGH (ref 70–99)
Glucose-Capillary: 103 mg/dL — ABNORMAL HIGH (ref 70–99)
Glucose-Capillary: 110 mg/dL — ABNORMAL HIGH (ref 70–99)

## 2012-10-04 LAB — CBC
HCT: 26.8 % — ABNORMAL LOW (ref 39.0–52.0)
Hemoglobin: 8.8 g/dL — ABNORMAL LOW (ref 13.0–17.0)
MCH: 24.5 pg — ABNORMAL LOW (ref 26.0–34.0)
MCHC: 32.8 g/dL (ref 30.0–36.0)
MCV: 74.7 fL — ABNORMAL LOW (ref 78.0–100.0)
Platelets: 313 10*3/uL (ref 150–400)
RBC: 3.59 MIL/uL — ABNORMAL LOW (ref 4.22–5.81)
RDW: 22.7 % — ABNORMAL HIGH (ref 11.5–15.5)
WBC: 8.9 10*3/uL (ref 4.0–10.5)

## 2012-10-04 LAB — HEPARIN LEVEL (UNFRACTIONATED): Heparin Unfractionated: 0.29 IU/mL — ABNORMAL LOW (ref 0.30–0.70)

## 2012-10-04 LAB — PROTIME-INR
INR: 1.54 — ABNORMAL HIGH (ref 0.00–1.49)
Prothrombin Time: 18 seconds — ABNORMAL HIGH (ref 11.6–15.2)

## 2012-10-04 MED ORDER — WARFARIN SODIUM 5 MG PO TABS
5.0000 mg | ORAL_TABLET | ORAL | Status: AC
  Administered 2012-10-04: 5 mg via ORAL
  Filled 2012-10-04: qty 1

## 2012-10-04 MED ORDER — ENOXAPARIN SODIUM 150 MG/ML ~~LOC~~ SOLN
1.5000 mg/kg | SUBCUTANEOUS | Status: DC
  Administered 2012-10-04: 130 mg via SUBCUTANEOUS
  Filled 2012-10-04 (×2): qty 1

## 2012-10-04 NOTE — Progress Notes (Addendum)
                    [address]            [place] [address]          [phone]     1 Day Post-Op Procedure(s) (LRB): ABDOMINAL AORTAGRAM (N/A)  Subjective: Feels well, no complaints this am.  Objective: Vital signs in last 24 hours: Patient Vitals for the past 24 hrs:  BP Temp Temp src Pulse Resp SpO2 Weight  10/04/12 0517 127/63 mmHg 98.3 F (36.8 C) Oral 83  18  98 % 190 lb 4.8 oz (86.32 kg)  10/03/12 2013 117/61 mmHg 98.5 F (36.9 C) Oral 87  20  98 % -  10/03/12 1444 125/65 mmHg - - 86  - - -  10/03/12 1303 121/63 mmHg - - 79  - - -  10/03/12 1218 137/63 mmHg - - 85  - - -  10/03/12 1137 120/70 mmHg - - 89  - - -  10/03/12 1127 127/65 mmHg - - 89  - - -  10/03/12 1100 123/65 mmHg - - 88  - - -  10/03/12 0931 - - - 97  - - -  10/03/12 0850 126/61 mmHg - - 90  - 94 % -   Current Weight  10/04/12 190 lb 4.8 oz (86.32 kg)  PRE-OPERATIVE WEIGHT: 92 kg    Intake/Output from previous day: 01/14 0701 - 01/15 0700 In: 1061.7 [P.O.:480; I.V.:531.7; IV Piggyback:50] Out: 550 [Urine:550]    PHYSICAL EXAM:  Heart: RRR Lungs: Clear Wound: Clean and dry Extremities: Mild LE edema    Lab Results: CBC: Basename 10/04/12 0525 10/03/12 0500  WBC 8.9 9.8  HGB 8.8* 9.2*  HCT 26.8* 27.7*  PLT 313 320   BMET: No results found for this basename: NA:2,K:2,CL:2,CO2:2,GLUCOSE:2,BUN:2,CREATININE:2,CALCIUM:2 in the last 72 hours    PT/INR:  Basename 10/04/12 0525  LABPROT 18.0*  INR 1.54*      Assessment/Plan: S/P Procedure(s) (LRB): ABDOMINAL AORTAGRAM (N/A)  CV- Maintaining SR. Amio decreased.  Continue Lopressor, Norvasc.   Bilat LE DVT- Continue Heparin, Coumadin restarted yesterday.  Endocarditis/MSSA bacteremia- continue Ancef x 6 weeks total postop.  PVD- Vascular to see as OP for followup.  Vol overload- continue diuresis.   CRPI/PT, continue pulm toilet  Disp- to SNF when medically stable.   LOS: 22 days    COLLINS,GINA  H 10/04/2012    Chart reviewed, patient examined, agree with above. Continue coumadin. Keep heparin going until INR is 2.0

## 2012-10-04 NOTE — Progress Notes (Signed)
Physical Therapy Treatment Patient Details Name: Orin Kiehl MRN: [id number] DOB: 19-Apr-1946 Today's Date: 10/04/2012 Time: [id number] PT Time Calculation (min): 52 min  PT Assessment / Plan / Recommendation Comments on Treatment Session  Pt progressing with mobility but limited by fatigue and slow recovery. Pt ambulated 150 with decreasing speed after first 110'.  Pt would be great CIR candidate but insurance not compatible, therefore, agree with SNF for rehab.  PT will continue to follow.    Follow Up Recommendations  SNF;Supervision/Assistance - 24 hour     Does the patient have the potential to tolerate intense rehabilitation     Barriers to Discharge        Equipment Recommendations  Other (comment) (Right PFRW)    Recommendations for Other Services    Frequency Min 3X/week   Plan Frequency remains appropriate;Discharge plan needs to be updated    Precautions / Restrictions Precautions Precautions: Fall;Sternal Restrictions RUE Weight Bearing: Weight bear through elbow only RLE Weight Bearing: Weight bearing as tolerated   Pertinent Vitals/Pain No c/o pain     Mobility  Bed Mobility Bed Mobility: Not assessed (pt up in chair) Transfers Transfers: Sit to Stand;Stand to Sit Sit to Stand: 3: Mod assist;From chair/3-in-1;With upper extremity assist Stand to Sit: 4: Min assist;With upper extremity assist;To chair/3-in-1 Details for Transfer Assistance: performed 2x, once from recliner and once from straight back chair. Mod A given at right side and pt puched on arm rest with LUE. Pt improving with fwd translation of wt with sit to stand. Needing assist with RUE to get off of [place] to sit.  Ambulation/Gait Ambulation/Gait Assistance: 4: Min assist Ambulation Distance (Feet): 150 Feet Assistive device: Right platform walker Ambulation/Gait Assistance Details: pt experiencing tension in the right trap, RW lowered one notch and platform raised accordingly which helped a  bit. vc's to attend to hsoulder posture also helped. Pt with good pace for 110' and then pt began to slow down and experience fatigue as well as slight dizziness. Pt helped to chair just inside his room for rest. HR 114 after ambulation, down to 95 with rest. O2 sats 92% after ambulation, 95% with rest. Pt was slow to recover to ambulate back to recliner, took about 5 mins and pt given peanut butter crackers and water. Per pt reports, this helped "weak" feeling. Pt reports that he has not had an appetite and has not been eating much.  Gait Pattern: Step-through pattern;Decreased step length - right;Decreased stance time - right;Trunk flexed Gait velocity: decreased General Gait Details: vc's for upright trunk, better swing-through with improved posture Stairs: No Wheelchair Mobility Wheelchair Mobility: No    Exercises General Exercises - Lower Extremity Long Arc Quad: AROM;Both;5 reps;Seated   PT Diagnosis:    PT Problem List:   PT Treatment Interventions:     PT Goals Acute Rehab PT Goals PT Goal Formulation: With patient/family Time For Goal Achievement: 10/18/12 Potential to Achieve Goals: Good Pt will go Supine/Side to Sit: with supervision PT Goal: Supine/Side to Sit - Progress: Goal set today Pt will go Sit to Supine/Side: with supervision PT Goal: Sit to Supine/Side - Progress: Goal set today Pt will go Sit to Stand: with min assist PT Goal: Sit to Stand - Progress: Goal set today Pt will go Stand to Sit: with supervision PT Goal: Stand to Sit - Progress: Goal set today Pt will Transfer Bed to Chair/Chair to Bed: with min assist PT Transfer Goal: Bed to Chair/Chair to Bed - Progress: Goal set  today Pt will Ambulate: >150 feet;with supervision;with rolling walker PT Goal: Ambulate - Progress: Goal set today  Visit Information  Last PT Received On: 10/04/12 Assistance Needed: +1    Subjective Data  Subjective: pt hesitant to try to stand with only 1 person assist but was  able to do so with encouragement Patient Stated Goal: To get stronger and go home   Cognition  Overall Cognitive Status: Appears within functional limits for tasks assessed/performed Arousal/Alertness: Awake/alert Orientation Level: Appears intact for tasks assessed Behavior During Session: Anxious Cognition - Other Comments: pt improving in ability to state precautions and was able to verbalize events of the morning and yesterday    Balance  Balance Balance Assessed: Yes Dynamic Standing Balance Dynamic Standing - Balance Support: Left upper extremity supported Dynamic Standing - Level of Assistance: 4: Min assist  End of Session PT - End of Session Equipment Utilized During Treatment: Gait belt Activity Tolerance: Patient tolerated treatment well Patient left: in chair;with call bell/phone within reach Nurse Communication: Mobility status   GP   Leighton Roach, [place]  [place], [place] 10/04/2012, 12:15 PM

## 2012-10-04 NOTE — Progress Notes (Signed)
I discussed with  Dr Bradshaw.  I agree with their plans documented in their progress note for today.  

## 2012-10-04 NOTE — Progress Notes (Signed)
Occupational Therapy Treatment Patient Details Name: Ryan [name] MRN: [id number] DOB: 1946/05/17 Today's Date: 10/04/2012 Time: [id number] OT Time Calculation (min): 30 min  OT Assessment / Plan / Recommendation Comments on Treatment Session Increased Right Digit PROM. Able to touch palm with fingertips at end of session. Pt given written handouts to help cue pt. Discussed with nsg -  need for pt to use R hand as much as possible. Using adapted spoon to help feed self with R hand.    Follow Up Recommendations  SNF    Barriers to Discharge       Equipment Recommendations  3 in 1 bedside comode;Tub/shower bench    Recommendations for Other Services    Frequency Min 4X/week   Plan Discharge plan remains appropriate    Precautions / Restrictions Precautions Precautions: Fall;Sternal Precaution Comments: stiff R hand Restrictions RLE Weight Bearing: Weight bearing as tolerated   Pertinent Vitals/Pain C.o pain R hand with ROMonly    ADL  Eating/Feeding: Minimal assistance;Other (comment) Where Assessed - Eating/Feeding: Chair ADL Comments: given red tubing . Able to self feed with R hand with adapted sponn and built up handle. focus of session on R hand ROM. gentle sustained passive stretch in composite flexion. Also worked on Geophysicist/field seismologist ROm. taught pt tendon gliding ex with handout. Also given theraputty handout. Began contract/relax exercises to increase ROM and strength.    OT Diagnosis:    OT Problem List:   OT Treatment Interventions:     OT Goals Acute Rehab OT Goals OT Goal Formulation: With patient/family Time For Goal Achievement: 10/13/12 Potential to Achieve Goals: Good ADL Goals Pt Will Perform Eating: with set-up;Sitting, chair;with adaptive utensils;Unsupported;Other (comment) ADL Goal: Eating - Progress: Progressing toward goals Pt Will Perform Grooming: with supervision;Standing at sink Pt Will Perform Upper Body Bathing: with supervision;Sitting,  chair;Sitting, edge of bed Pt Will Perform Lower Body Bathing: with mod assist;Sit to stand from bed;with adaptive equipment Pt Will Perform Upper Body Dressing: with supervision;Unsupported;Sitting, bed Pt Will Perform Lower Body Dressing: with mod assist;Unsupported;Sit to stand from bed Pt Will Transfer to Toilet: with min assist;Ambulation;with DME;Comfort height toilet Pt Will Perform Toileting - Clothing Manipulation: with mod assist;Sitting on 3-in-1 or toilet;Standing Pt Will Perform Toileting - Hygiene: with mod assist;Sit to stand from 3-in-1/toilet Arm Goals Pt Will Complete Theraputty Exer: with supervision, verbal cues required/provided;to increase strength;Right upper extremity;Min resistance putty;Other (comment) Arm Goal: Theraputty Exercises - Progress: Progressing toward goal Additional Arm Goal #1: Pt will demonstrate full composite flexion R hand AAROM to increase independence with ADL Arm Goal: Additional Goal #1 - Progress: Progressing toward goals Additional Arm Goal #2: Pt will demonstrate opposition to 4th digit AROM to increase independence with ADL. Arm Goal: Additional Goal #2 - Progress: Progressing toward goals Miscellaneous OT Goals Miscellaneous OT Goal #1: Pt will independently perform right hand AROM/AAROM 2-3 x daily. OT Goal: Miscellaneous Goal #1 - Progress: Progressing toward goals Miscellaneous OT Goal #2: Pt will independently perform neck AROM 2-3 x daily. OT Goal: Miscellaneous Goal #2 - Progress: Progressing toward goals Miscellaneous OT Goal #3: Pt will independently maintain sternal precautions during functional mobility.  Visit Information  Last OT Received On: 10/04/12 Assistance Needed: +1    Subjective Data      Prior Functioning       Cognition  Overall Cognitive Status: Impaired Area of Impairment: Memory;Problem solving Arousal/Alertness: Awake/alert Orientation Level: Appears intact for tasks assessed Behavior During Session:  [facility] for tasks performed Memory:  (decrease  recall of exercises) Memory Deficits: apparent memory deficit Problem Solving: A for problem solving regarding exercise    Mobility  Shoulder Instructions         Exercises  Shoulder Exercises Digit Composite Flexion: AAROM;PROM;Right;Other (comment) (Able to touch digits to crease after sesion) Composite Extension: PROM;AAROM;Right Hand Exercises Forearm Supination: AROM;AAROM;Right;Other (comment) (limited supination) Forearm Pronation: AAROM;AROM;Right;5 reps Wrist Flexion: AROM;Right Wrist Extension: AAROM;PROM;Right;5 reps;Other (comment) (gentle passive stretch) Wrist Ulnar Deviation: AAROM;AROM;Right;5 reps Wrist Radial Deviation: AROM;AAROM;Right;5 reps Other Exercises Other Exercises: retrograde massage   Balance     End of Session OT - End of Session Activity Tolerance: Patient tolerated treatment well Patient left: in chair;with call bell/phone within reach Nurse Communication: Other (comment)  GO     Aryelle Figg,[place] 10/04/2012, 6:58 PM [facility], OTR/L  [phone] 10/04/2012

## 2012-10-04 NOTE — Progress Notes (Signed)
Wet to dry dressing changed to right wrist. "Quarter-sized"  wound noted 1/2 in depth. Wound bed pink with no signs/symptoms of infection. Site cleansed with saline and wet to dry dressing applied. "Quarter-sized black eschar area noted above right great toe; dry dressing changed. Patient tolerated well. Ryan [name]

## 2012-10-04 NOTE — Clinical Social Work Note (Signed)
CSW confirmed SNF bed at [facility] per Pt's request. Insurance preauth started.  Chrys Racer, [place]

## 2012-10-04 NOTE — Progress Notes (Signed)
ANTICOAGULATION CONSULT NOTE - Follow Up Consult  Pharmacy Consult for heparin Indication: DVT  No Known Allergies  Patient Measurements: Height: 6' (182.9 cm) Weight: 190 lb 4.8 oz (86.32 kg) IBW/kg (Calculated) : 77.6  Heparin Dosing Weight: 87 kg  Vital Signs: Temp: 98.3 F (36.8 C) (01/15 0517) Temp src: Oral (01/15 0517) BP: 127/63 mmHg (01/15 0517) Pulse Rate: 83  (01/15 0517)  Labs:  Basename 10/04/12 0525 10/03/12 1802 10/03/12 0500 10/02/12 0630  HGB 8.8* -- 9.2* --  HCT 26.8* -- 27.7* 26.6*  PLT 313 -- 320 275  APTT -- -- -- --  LABPROT 18.0* 17.3* -- --  INR 1.54* 1.46 -- --  HEPARINUNFRC 0.29* -- 0.32 0.41  CREATININE -- -- -- --  CKTOTAL -- -- -- --  CKMB -- -- -- --  TROPONINI -- -- -- --    Estimated Creatinine Clearance: 97 ml/min (by C-G formula based on Cr of 0.79).   Medications:  Scheduled:     . amiodarone  200 mg Oral Daily  . amLODipine  2.5 mg Oral Daily  . atorvastatin  40 mg Oral q1800  .  ceFAZolin (ANCEF) IV  2 g Intravenous Q8H  . colchicine  0.6 mg Oral BID  . [COMPLETED] coumadin book   Does not apply Once  . docusate sodium  200 mg Oral Daily  . enoxaparin (LOVENOX) injection  1.5 mg/kg Subcutaneous Q24H  . feeding supplement  237 mL Oral BID BM  . feeding supplement  30 mL Oral BID  . iron polysaccharides  150 mg Oral Daily  . metoprolol tartrate  12.5 mg Oral BID  . pantoprazole  40 mg Oral QAC breakfast  . sodium chloride  3 mL Intravenous Q12H  . [COMPLETED] warfarin  5 mg Oral NOW  . warfarin  5 mg Oral NOW  . [COMPLETED] warfarin   Does not apply Once  . Warfarin - Pharmacist Dosing Inpatient   Does not apply q1800  . [DISCONTINUED] warfarin  5 mg Oral ONCE-1800  . [DISCONTINUED] Warfarin - Physician Dosing Inpatient   Does not apply q1800   Infusions:     . sodium chloride 50 mL/hr at 10/03/12 1159  . [DISCONTINUED] heparin 1,600 Units/hr (10/02/12 2306)  . [DISCONTINUED] heparin 1,600 Units/hr (10/03/12 2123)     Assessment: 67yo male sent to ED from OSH for concerns for ischemic leg w/ 2d h/o pain, vascular exam shows chronic vascular dz. Noted to have AMS, to begin IV ABX for pyelonephritis/ urosepsis on admit.   AC: Heparin for ischemic leg d/c'd 12/27 2/2 suspected pericarditis. Now with B DVT, Afib, s/p MVR. MD changed to Lovenox 130mg /24h. INR 1.54. Hgb slightly lower 8.8.  Goal of Therapy:  Full dose Lovenox Monitor platelets by anticoagulation protocol: Yes   Plan:  1) MD ordered changed to Lovenox 130mg /24h 2) MD ordered Coumadin 5mg  po x 1 now.   Dahiana Kulak S. Alford Highland, PharmD, BCPS Clinical Staff Pharmacist Pager [phone]  Eilene Ghazi Stillinger 10/04/2012,9:57 AM

## 2012-10-04 NOTE — Progress Notes (Signed)
Family Medicine Teaching Service Daily Progress Note Service Page: [phone]  Patient Assessment: 67 y.o. year old male with acute encephalopathy (now resolved) secondary to sepsis from multiple sources, s/p I&D of R wrist and R MTP lesion (with HW removal), Multiple small R hemisphere infarcts that are likely septic emboli, C6-T1 Diskitis, pericarditis,  Endocarditis (s/p MVR), and E. Coli UTI (s/p treatment). Now s/p mitral valve replacement on 1/7.  Subjective:  Feeling well this am, a little bothered by SCD. Pain well controlled. Occasional dyspnea. Some chest soreness.    Objective: Temp:  [98.3 F (36.8 C)-98.5 F (36.9 C)] 98.3 F (36.8 C) (01/15 0517) Pulse Rate:  [79-97] 83  (01/15 0517) Resp:  [18-20] 18  (01/15 0517) BP: (117-137)/(61-70) 127/63 mmHg (01/15 0517) SpO2:  [94 %-98 %] 98 % (01/15 0517) Weight:  [190 lb 4.8 oz (86.32 kg)] 190 lb 4.8 oz (86.32 kg) (01/15 0517)  Exam: Gen: NAD, alert, cooperative with exam, sleeping comfortably in bed HEENT: NCAT, EOMI, MMM Neck:  Mild tenderness to palpation of Cervical spine, improved ROM compared to 1/11  CV: RRR, good S1/S2 with additional S3 Resp: CTABL, no wheezes, non-labored Abd: Soft, NTND, + BS Ext: 1+ pitting edema on RLE, warm; dressing in place to right wrist/hand and right food-clean/dry/intact Neuro: Alert, oriented/appropriate in conversation, face symmetric;  Speaks easily  I have reviewed the patient's medications, labs, imaging, and diagnostic testing.  Notable results are summarized below.  CBC BMET   Lab 10/04/12 0525 10/03/12 0500 10/02/12 0630  WBC 8.9 9.8 12.1*  HGB 8.8* 9.2* 8.9*  HCT 26.8* 27.7* 26.6*  PLT 313 320 275    Lab 10/01/12 0655 09/30/12 0511 09/29/12 0510  NA 136 137 135  K 3.7 4.0 4.0  CL 100 102 101  CO2 24 26 25   BUN 14 20 27*  CREATININE 0.79 0.84 1.02  GLUCOSE 95 81 101*  CALCIUM 8.1* 7.9* 8.1*    Blood Culture 12/26- No growth- final Blood culture 12/30- No growth-  final C. Diff 1/4 - negative  Imaging/Diagnostic Tests: TEE 09/18/2012 - The estimated ejection fraction was in the range of 60% to 65%. - Aortic valve: No evidence of vegetation. - Mitral valve: Large 2.5cm diameter vegetation - Left atrium: No evidence of thrombus in the atrial cavity or appendage. - Right atrium: No evidence of thrombus in the atrial cavity or appendage. - Tricuspid valve: No evidence of vegetation. - Pulmonic valve: No evidence of vegetation. - Pericardium, extracardiac: A small, free-flowing pericardial effusion was identified along the right atrial free wall.  MRI Head 12/27 Several small areas of acute infarction involving the right  hemisphere including portions of the; right occipital lobe, right  temporal lobe, right opercular region, posterior right frontal lobe  and right parietal lobe.  MRI Cervical Spine 12/27 C6-7 diskitis with spread of infection to the adjacent facet  joints, spinous process and interspinous ligaments/posterior  paraspinal musculature as discussed above. Epidural abscess  suspected although evaluation limited without contrast.  CXR 09/26/2012 IMPRESSION:  Postoperative changes with support apparatus as described.  Left pleural effusion and bibasilar atelectasis - no evidence of  pneumothorax.  CXR 09/27/2012 IMPRESSION:  1. Somewhat low lung volumes with bibasilar air space disease,  left greater than right, and probable small left pleural effusion.  2. No definite pneumothorax.  Venous duplex 09/28/2012 Summary: Findings consistent with deep vein thrombosis involving the right mid femoral vein (behind a valve leaflet), the right gastrocnemius vein and the left gastrocnemius vein.  Plan: Lorena Zins is a 67 y.o. year old male with acute encephalopathy secondary to sepsis from multiple sources.  Now post-op from mitral valve replacement on 09/26/2012. Now also post angio of his LE. Post op pain well controlled on oral meds  and PT going well to this point. Will begin looking for SNF placement and arranging f/u appts with specialists.   PAD/Ischemic limb: s/p LE angio on 10/03/2012 - Limb warm - Vascular on board- appreciate recommendations  - R popliteal artery occlusion with single runoff would be very difficult endovascualr recanalization, will re-evaluate in 2-3 weeks and intervene with endovascular vs bypass at that time if wound is still not healing.  - concern on admission that LE was ischemic.  - R ABI 0.48 on 1/2/214, L ABI WNL  Post-op Anemia - Hgb dropped to 7.1 on 1/11, now stable at 8.8 post transfusion 2 units PRBC on 1/11 - fecal occult blood negative  DVT - BL DVT likely from bedbound state since admission - Heparin drip per Dr. Cyndia Bent, transitioning to PO warfarin and lovenox bridge today, postponed previously for vasular intervention.   CV- Stable off pressors - s/p Mitral valve replacement day 8 - Cardiology and CTS on board, greatly appreciate recommendations - + 2.4L for admission - Heart cath on 09/21/2012, Findings:  - Widely patent coronary arteries, pulmonary HTN with PCWP of 41mmHg  - Post-op pain- PRN oxycodone and tramadol   Afib with RVR- Pericarditis - Afib likely due to pericarditis, pericarditis improving - causing Afib, currently in NSR  with amiodarone - continue per cardiology, Amiodarone 200  Mg daily - TEE on 12/30 confirmed MV vegetation listed below and slight improvement of pericarditis  Acute Diastolic Heart failure - will likely improve s/p MVR (done 1/7), improving evidenced by improvement in dyspnea  - secondary to mitral regurgitation 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  - Strict I/O, daily weights  Resp-  - stable on RA - no complaints of dyspnea currently  - CXR with Small L pleural effusion post op, chest tube placed by CTS 1/8 and removed 1/9  ID - metastatic infection - ID on Board-   - Continue Ancef,  likely will need 6-8 weeks  - per Dr. 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  - Resolved, follow daily BMET  Elevated Transaminases  - Pt AST:ALT 2:1 consistent with alcoholic picture. EtOH on admission < 11  - consider recheck LFT's  Newly Dx DM II - A1C 6.5 on check  - Insulin drip 1/8, transitioned to subq insulin post op, now monitoring CBGs and SSI if needed - CBG's 100-102 - Consider sensitive SSI if starts to develop hyperglycemia, Blood glucose fine for now.   FEN/GI:  - Carb modified diet  - KVO NS - Strict I/O  Prophylaxis: dc SCD, anticoagulated Disposition: tele,  CIR vs SNF with PT after workup.   Kenn File, MD 10/04/2012, 8:13 AM

## 2012-10-04 NOTE — Progress Notes (Addendum)
CARDIAC REHAB PHASE I   PRE:  Rate/Rhythm: 84 SR    BP: sitting 126/67    SaO2: 94 RA  MODE:  Ambulation: 150 ft   POST:  Rate/Rhythm: 122 ST    BP: sitting 138/64     SaO2: 90 RA  Used platform walker, gait belt and assist x2. No sitting rests. Fatigued this pm due to days activities. Exhausted after walk. HR slightly higher than normal while walking. SaO2 slow to register then 90 RA. Return to recliner.  [place], [place], ACSM

## 2012-10-04 NOTE — Progress Notes (Signed)
Rehab Admissions Coordinator Note:  Patient was screened by Cleatrice Burke for appropriateness for an Inpatient Acute Rehab Consult. [name] will not approve inpt rehab admission for this diagnosis. I was contacted by MD and therapy. As we stated last week, insurance is barrier to inpt rehab admission. Please call for any questions. [phone]  Cleatrice Burke 10/04/2012, 11:08 AM  I can be reached at [phone].

## 2012-10-04 NOTE — Progress Notes (Signed)
[facility] Progress Note  10/04/2012 8:51 AM POD 1  Subjective:  Pt on BSC; no complaints about foot.  Afebrile  Filed Vitals:   10/04/12 0517  BP: 127/63  Pulse: 83  Temp: 98.3 F (36.8 C)  Resp: 18      CBC    Component Value Date/Time   WBC 8.9 10/04/2012 0525   RBC 3.59* 10/04/2012 0525   HGB 8.8* 10/04/2012 0525   HCT 26.8* 10/04/2012 0525   PLT 313 10/04/2012 0525   MCV 74.7* 10/04/2012 0525   MCH 24.5* 10/04/2012 0525   MCHC 32.8 10/04/2012 0525   RDW 22.7* 10/04/2012 0525    BMET    Component Value Date/Time   NA 136 10/01/2012 0655   K 3.7 10/01/2012 0655   CL 100 10/01/2012 0655   CO2 24 10/01/2012 0655   GLUCOSE 95 10/01/2012 0655   BUN 14 10/01/2012 0655   CREATININE 0.79 10/01/2012 0655   CALCIUM 8.1* 10/01/2012 0655   GFRNONAA >90 10/01/2012 0655   GFRAA >90 10/01/2012 0655    INR    Component Value Date/Time   INR 1.54* 10/04/2012 0525     Intake/Output Summary (Last 24 hours) at 10/04/12 0851 Last data filed at 10/04/12 0518  Gross per 24 hour  Intake 1061.67 ml  Output    550 ml  Net 511.67 ml     Assessment/Plan:  67 y.o. male is s/p  1. ultrasound access left common femoral artery  2. abdominal aortogram  3. bilateral lower extremity runoff  4. 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 The peroneal artery is the single vessel runoff. This would be a very complicated endovascular recanalization, particularly given the fact that he only has one vessel runoff. I made the decision to not proceed with intervention, but rather to evaluate the patient's foot wound in 2-3 weeks. At that time, if he is still dealing with non-healing issues, consideration would be made for recanalization of his occluded popliteal artery versus above-knee popliteal artery to peroneal artery bypass  graft.  #2 single vessel runoff on the left via the peroneal artery  Pt will f/u with Dr. Trula Slade in a couple of weeks to re-evaluate his foot wounds and decide course of treatment at that time. -disposition per primary team   Leontine Locket, PA-C [facility] [phone] 10/04/2012 8:51 AM

## 2012-10-04 NOTE — Progress Notes (Signed)
Occupational Therapy Treatment Patient Details Name: Ryan [name] MRN: [id number] DOB: [birth year] Today's Date: 10/04/2012 Time: [id number] OT Time Calculation (min): 17 min  OT Assessment / Plan / Recommendation Comments on Treatment Session Making progress. Pt requires cues to use R  hand. Will return this pm for further ROM after pasive stretch    Follow Up Recommendations  SNF    Barriers to Discharge   none    Equipment Recommendations    none   Recommendations for Other Services  none  Frequency Min 4X/week   Plan Discharge plan remains appropriate    Precautions / Restrictions Precautions Precautions: Fall;Sternal Restrictions RLE Weight Bearing: Weight bearing as tolerated   Pertinent Vitals/Pain 3. R hand with movement    ADL  ADL Comments: heat x 20 min R digits followed by gentle ROM. Wrapped into composite flexion after ROM.    OT Diagnosis:    OT Problem List:   OT Treatment Interventions:     OT Goals Acute Rehab OT Goals OT Goal Formulation: With patient/family Time For Goal Achievement: 10/13/12 Potential to Achieve Goals: Good ADL Goals Pt Will Perform Eating: with set-up;Sitting, chair;with adaptive utensils;Unsupported;Other (comment) Pt Will Perform Grooming: with supervision;Standing at sink Pt Will Perform Upper Body Bathing: with supervision;Sitting, chair;Sitting, edge of bed Pt Will Perform Lower Body Bathing: with mod assist;Sit to stand from bed;with adaptive equipment Pt Will Perform Upper Body Dressing: with supervision;Unsupported;Sitting, bed Pt Will Perform Lower Body Dressing: with mod assist;Unsupported;Sit to stand from bed Pt Will Transfer to Toilet: with min assist;Ambulation;with DME;Comfort height toilet Pt Will Perform Toileting - Clothing Manipulation: with mod assist;Sitting on 3-in-1 or toilet;Standing Pt Will Perform Toileting - Hygiene: with mod assist;Sit to stand from 3-in-1/toilet Arm Goals Pt Will Complete  Theraputty Exer: with supervision, verbal cues required/provided;to increase strength;Right upper extremity;Min resistance putty;Other (comment) Additional Arm Goal #1: Pt will demonstrate full composite flexion R hand AAROM to increase independence with ADL Arm Goal: Additional Goal #1 - Progress: Progressing toward goals Additional Arm Goal #2: Pt will demonstrate opposition to 4th digit AROM to increase independence with ADL. Arm Goal: Additional Goal #2 - Progress: Progressing toward goals Miscellaneous OT Goals Miscellaneous OT Goal #1: Pt will independently perform right hand AROM/AAROM 2-3 x daily. OT Goal: Miscellaneous Goal #1 - Progress: Progressing toward goals Miscellaneous OT Goal #2: Pt will independently perform neck AROM 2-3 x daily. OT Goal: Miscellaneous Goal #2 - Progress: Progressing toward goals Miscellaneous OT Goal #3: Pt will independently maintain sternal precautions during functional mobility.  Visit Information  Last OT Received On: 10/04/12 Assistance Needed: +1    Subjective Data      Prior Functioning       Cognition       Mobility  Shoulder Instructions         Exercises  Shoulder Exercises Digit Composite Flexion: AAROM;PROM;Right Composite Extension: AAROM;PROM;Strengthening;5 reps Hand Exercises Wrist Extension: PROM;AAROM;Right;Other (comment) (continued passive stretch) Other Exercises Other Exercises: retrograde massage   Balance     End of Session OT - End of Session Activity Tolerance: Patient tolerated treatment well Patient left: in chair;with call bell/phone within reach Nurse Communication: Other (comment) (page OT if pt becomes uncomfortable with ace wrap)  GO     Sion Reinders,HILLARY 10/04/2012, 6:44 PM [facility], OTR/L  [phone] 10/04/2012

## 2012-10-05 LAB — CBC
HCT: 29.7 % — ABNORMAL LOW (ref 39.0–52.0)
Hemoglobin: 9.7 g/dL — ABNORMAL LOW (ref 13.0–17.0)
MCH: 24.3 pg — ABNORMAL LOW (ref 26.0–34.0)
MCHC: 32.7 g/dL (ref 30.0–36.0)
MCV: 74.4 fL — ABNORMAL LOW (ref 78.0–100.0)
Platelets: 300 10*3/uL (ref 150–400)
RBC: 3.99 MIL/uL — ABNORMAL LOW (ref 4.22–5.81)
RDW: 22.6 % — ABNORMAL HIGH (ref 11.5–15.5)
WBC: 8.9 10*3/uL (ref 4.0–10.5)

## 2012-10-05 LAB — BASIC METABOLIC PANEL
BUN: 8 mg/dL (ref 6–23)
BUN: 9 mg/dL (ref 6–23)
CO2: 27 mEq/L (ref 19–32)
CO2: 26 mEq/L (ref 19–32)
Calcium: 8.1 mg/dL — ABNORMAL LOW (ref 8.4–10.5)
Calcium: 8.2 mg/dL — ABNORMAL LOW (ref 8.4–10.5)
Chloride: 102 mEq/L (ref 96–112)
Chloride: 100 mEq/L (ref 96–112)
Creatinine, Ser: 0.77 mg/dL (ref 0.50–1.35)
Creatinine, Ser: 0.67 mg/dL (ref 0.50–1.35)
GFR calc Af Amer: >90 mL/min (ref >90)
GFR calc Af Amer: >90 mL/min (ref >90)
GFR calc non Af Amer: >90 mL/min (ref >90)
GFR calc non Af Amer: >90 mL/min (ref >90)
Glucose, Bld: 97 mg/dL (ref 70–99)
Glucose, Bld: 125 mg/dL — ABNORMAL HIGH (ref 70–99)
Potassium: 2.8 mEq/L — ABNORMAL LOW (ref 3.5–5.1)
Potassium: 3 mEq/L — ABNORMAL LOW (ref 3.5–5.1)
Sodium: 139 mEq/L (ref 135–145)
Sodium: 136 mEq/L (ref 135–145)

## 2012-10-05 LAB — GLUCOSE, CAPILLARY
Glucose-Capillary: 95 mg/dL (ref 70–99)
Glucose-Capillary: 100 mg/dL — ABNORMAL HIGH (ref 70–99)
Glucose-Capillary: 92 mg/dL (ref 70–99)
Glucose-Capillary: 107 mg/dL — ABNORMAL HIGH (ref 70–99)

## 2012-10-05 LAB — PROTIME-INR
INR: 2.65 — ABNORMAL HIGH (ref 0.00–1.49)
Prothrombin Time: 27 seconds — ABNORMAL HIGH (ref 11.6–15.2)

## 2012-10-05 MED ORDER — WARFARIN SODIUM 2.5 MG PO TABS
2.5000 mg | ORAL_TABLET | Freq: Once | ORAL | Status: AC
  Administered 2012-10-05: 2.5 mg via ORAL
  Filled 2012-10-05: qty 1

## 2012-10-05 MED ORDER — POTASSIUM CHLORIDE CRYS ER 20 MEQ PO TBCR
40.0000 meq | EXTENDED_RELEASE_TABLET | Freq: Once | ORAL | Status: AC
  Administered 2012-10-05: 40 meq via ORAL
  Filled 2012-10-05 (×2): qty 2

## 2012-10-05 MED ORDER — ENOXAPARIN SODIUM 150 MG/ML ~~LOC~~ SOLN: 1.5000 mg/kg | SUBCUTANEOUS | Status: DC

## 2012-10-05 MED ORDER — WARFARIN SODIUM 2.5 MG PO TABS
2.5000 mg | ORAL_TABLET | Freq: Once | ORAL | Status: DC
  Filled 2012-10-05: qty 1

## 2012-10-05 MED ORDER — POTASSIUM CHLORIDE CRYS ER 20 MEQ PO TBCR
40.0000 meq | EXTENDED_RELEASE_TABLET | Freq: Two times a day (BID) | ORAL | Status: DC
  Administered 2012-10-05 – 2012-10-06 (×3): 40 meq via ORAL
  Filled 2012-10-05 (×4): qty 2

## 2012-10-05 NOTE — Progress Notes (Signed)
RACARDIAC REHAB PHASE I   PRE:  Rate/Rhythm: 85 SR  BP:  Supine:   Sitting: 128/64  Standing:    SaO2: 98 RA  MODE:  Ambulation: 250 ft   POST:  Rate/Rhythem: 110  BP:  Supine:   Sitting: 142/62  Standing:    SaO2: 97 RA 1108-1146   Pt has improved ambulation by 100 ft today. Assisted x2, with gait belt and hemi walker. Pt is gradually improving, but still fatigues easily. Follow up for education before d/c.   Ryan [name]

## 2012-10-05 NOTE — Progress Notes (Signed)
Occupational Therapy Treatment Patient Details Name: Ryan [name] MRN: [id number] DOB: [birth year] Today's Date: 10/05/2012 Time: [id number] OT Time Calculation (min): 23 min  OT Assessment / Plan / Recommendation Comments on Treatment Session Pt progressing with independence in performing R UE HEP but continues to require cueing to recall steps and technique of HEP      Follow Up Recommendations  SNF    Barriers to Discharge       Equipment Recommendations  3 in 1 bedside comode;Tub/shower bench    Recommendations for Other Services    Frequency Min 4X/week   Plan Discharge plan remains appropriate    Precautions / Restrictions Precautions Precautions: Fall;Sternal Precaution Comments: stiff R hand Restrictions Weight Bearing Restrictions: No RUE Weight Bearing: Weight bear through elbow only RLE Weight Bearing: Weight bearing as tolerated   Pertinent Vitals/Pain See vitals    ADL  Eating/Feeding: Set up (built up foam handle on spoon) Where Assessed - Eating/Feeding: Chair ADL Comments: Pt able to use R hand to bring adapted spoon with red tubing to mouth x5.  Pt reported having some difficulty with self feeding at lunch. When asked pt to demonstrate holding spoon in right hand, pt attempting to use pincher grip between index finger and thumb to hold spoon.  Positioned spoon in more of a palmar grasp to secure spoon.  Focus of session on R hand ROM.  Provided isolated digit PROM/AAROM and then comoposite flexion ROM.  Pt able to demonstrate tendon gliding ex taught in last session but required min assist for technique.      OT Diagnosis:    OT Problem List:   OT Treatment Interventions:     OT Goals ADL Goals Pt Will Perform Eating: with set-up;Sitting, chair;with adaptive utensils;Unsupported;Other (comment) ADL Goal: Eating - Progress: Progressing toward goals Arm Goals Additional Arm Goal #1: Pt will demonstrate full composite flexion R hand AAROM to increase  independence with ADL Arm Goal: Additional Goal #1 - Progress: Progressing toward goals Additional Arm Goal #2: Pt will demonstrate opposition to 4th digit AROM to increase independence with ADL. Arm Goal: Additional Goal #2 - Progress: Progressing toward goals Miscellaneous OT Goals Miscellaneous OT Goal #1: Pt will independently perform right hand AROM/AAROM 2-3 x daily. OT Goal: Miscellaneous Goal #1 - Progress: Progressing toward goals  Visit Information  Last OT Received On: 10/05/12 Assistance Needed: +1    Subjective Data      Prior Functioning       Cognition  Overall Cognitive Status: Impaired Area of Impairment: Memory;Problem solving Arousal/Alertness: Awake/alert Orientation Level: Appears intact for tasks assessed Behavior During Session: [facility] for tasks performed Memory:  (decrease recall of exercises) Memory Deficits: apparent memory deficit Problem Solving: A for problem solving regarding exercise    Mobility  Shoulder Instructions         Exercises  Shoulder Exercises Digit Composite Flexion: AAROM;PROM;Right;Other (comment);10 reps Composite Extension: PROM;AAROM;Right;10 reps Hand Exercises Digit Lifts: Self ROM;Right;5 reps Opposition: AROM;AAROM;Right;5 reps (thumb to index and middle fingers)   Balance     End of Session OT - End of Session Activity Tolerance: Patient tolerated treatment well Patient left: in chair;with call bell/phone within reach  GO    10/05/2012 Darrol Jump OTR/L Pager [phone] Office [phone]  Darrol Jump 10/05/2012, 3:43 PM

## 2012-10-05 NOTE — Progress Notes (Addendum)
ANTICOAGULATION CONSULT NOTE - Follow Up Consult  Pharmacy Consult for Coumadin Indication: DVT  No Known Allergies  Patient Measurements: Height: 6' (182.9 cm) Weight: 187 lb 9.8 oz (85.1 kg) IBW/kg (Calculated) : 77.6  Heparin Dosing Weight: 87 kg  Vital Signs: Temp: 98.8 F (37.1 C) (01/16 0436) Temp src: Oral (01/16 0436) BP: 125/67 mmHg (01/16 0436) Pulse Rate: 84  (01/16 0436)  Labs:  Basename 10/05/12 0516 10/04/12 0525 10/03/12 1802 10/03/12 0500  HGB -- 8.8* -- 9.2*  HCT -- 26.8* -- 27.7*  PLT -- 313 -- 320  APTT -- -- -- --  LABPROT 27.0* 18.0* 17.3* --  INR 2.65* 1.54* 1.46 --  HEPARINUNFRC -- 0.29* -- 0.32  CREATININE 0.77 -- -- --  CKTOTAL -- -- -- --  CKMB -- -- -- --  TROPONINI -- -- -- --    Estimated Creatinine Clearance: 97 ml/min (by C-G formula based on Cr of 0.77).   Medications:  Scheduled:     . amiodarone  200 mg Oral Daily  . amLODipine  2.5 mg Oral Daily  . atorvastatin  40 mg Oral q1800  .  ceFAZolin (ANCEF) IV  2 g Intravenous Q8H  . colchicine  0.6 mg Oral BID  . docusate sodium  200 mg Oral Daily  . feeding supplement  237 mL Oral BID BM  . feeding supplement  30 mL Oral BID  . iron polysaccharides  150 mg Oral Daily  . metoprolol tartrate  12.5 mg Oral BID  . pantoprazole  40 mg Oral QAC breakfast  . potassium chloride  40 mEq Oral BID  . sodium chloride  3 mL Intravenous Q12H  . warfarin  2.5 mg Oral ONCE-1800  . [COMPLETED] warfarin  5 mg Oral NOW  . Warfarin - Pharmacist Dosing Inpatient   Does not apply q1800  . [DISCONTINUED] enoxaparin (LOVENOX) injection  1.5 mg/kg Subcutaneous Q24H   Infusions:     . [DISCONTINUED] sodium chloride 50 mL/hr at 10/03/12 1159    Assessment:: 67yo male sent to ED from OSH for concerns for ischemic leg w/ 2d h/o pain, vascular exam shows chronic vascular dz. Noted to have AMS, to begin IV ABX for pyelonephritis/ urosepsis on admit.   AC: Heparin for ischemic leg d/c'd 12/27 2/2  suspected pericarditis. Now with B DVT, Afib, s/p MVR. Hgb slightly lower 8.8.  INR 2.65 but patient has only had 2 doses of Coumadin. Needs 5 days overlap regardless of INR to adequately deplete Vitamin K dependent clotting factors with B DVT. Called PA and Dr. Cyndia Bent in [place] instructed me not to resume Lovenox.  Goal of Therapy:  INR 2-3 Monitor platelets by anticoagulation protocol: Yes   Plan:  1) PA d/c'd Lovenox 2) PA ordered Coumadin 2.5mg  po x 1 today (and the dose yesterday.) 3) Will change to COUMADIN dosing per MD 4) Do not resume Lovenox per MD verbal order.   Alonza Knisley S. Alford Highland, PharmD, BCPS Clinical Staff Pharmacist Pager [phone]  Eilene Ghazi Stillinger 10/05/2012,9:41 AM

## 2012-10-05 NOTE — Progress Notes (Addendum)
                    [address]            [place], [address]          [phone]     2 Days Post-Op Procedure(s) (LRB): ABDOMINAL AORTAGRAM (N/A)  Subjective: No new complaints.   Objective: Vital signs in last 24 hours: Patient Vitals for the past 24 hrs:  BP Temp Temp src Pulse Resp SpO2 Weight  10/05/12 0436 125/67 mmHg 98.8 F (37.1 C) Oral 84  18  93 % 187 lb 9.8 oz (85.1 kg)  10/04/12 1930 137/74 mmHg 99 F (37.2 C) Oral 89  18  96 % -  10/04/12 1304 126/67 mmHg 98.2 F (36.8 C) Oral 82  18  94 % -  10/04/12 1100 126/61 mmHg - - 91  - - -  10/04/12 1015 - - - 114  - 94 % -   Current Weight  10/05/12 187 lb 9.8 oz (85.1 kg)   PRE-OPERATIVE WEIGHT: 92 kg   Intake/Output from previous day: 01/15 0701 - 01/16 0700 In: 243 [P.O.:240; I.V.:3] Out: 276 [Urine:275; Stool:1]    PHYSICAL EXAM:  Heart: RRR Lungs: Clear Wound: Clean and dry Extremities: 1+ LE edema    Lab Results: CBC: Basename 10/04/12 0525 10/03/12 0500  WBC 8.9 9.8  HGB 8.8* 9.2*  HCT 26.8* 27.7*  PLT 313 320   BMET:  Basename 10/05/12 0516  NA 139  K 2.8*  CL 102  CO2 27  GLUCOSE 97  BUN 8  CREATININE 0.77  CALCIUM 8.1*    PT/INR:  Basename 10/05/12 0516  LABPROT 27.0*  INR 2.65*      Assessment/Plan: S/P Procedure(s) (LRB): ABDOMINAL AORTAGRAM (N/A)  CV- stable, SR.  Continue current meds.  INR therapeutic.  Will d/c Heparin, Lovenox, continue Coumadin.  Will d/c EPWs today- aware that INR is elevated.    PVD- OP f/u with [facility].  Endocarditis/MSSA bacteremia- continue IV Ancef.  Disp- SNF at d/c.   LOS: 23 days    COLLINS,GINA H 10/05/2012    Chart reviewed, patient examined, agree with above. He is therapeutic on coumadin for DVT. Heparin stopped. He is to have pacing wires removed today and can go to SNF tomorrow.

## 2012-10-05 NOTE — Progress Notes (Signed)
The patient was up ambulating in the hall.  No atrial fibrillation on telemetry. Amiodarone therapy was decreased to 200 mg daily 2 days ago.  Slowly improving.

## 2012-10-05 NOTE — Progress Notes (Signed)
Family Medicine Teaching Service Daily Progress Note Service Page: [phone]  Patient Assessment: 67 y.o. year old male with acute encephalopathy (now resolved) secondary to sepsis from multiple sources, s/p I&D of R wrist and R MTP lesion (with HW removal), Multiple small R hemisphere infarcts that are likely septic emboli, C6-T1 Diskitis, pericarditis,  Endocarditis (s/p MVR), and E. Coli UTI (s/p treatment). Now s/p mitral valve replacement on 1/7.  Subjective:  Feeling well today, pain controlled well. No chest pain or dyspnea. Eating well.   Objective: Temp:  [98.2 F (36.8 C)-99 F (37.2 C)] 98.8 F (37.1 C) (01/16 0436) Pulse Rate:  [82-114] 84  (01/16 0436) Resp:  [18] 18  (01/16 0436) BP: (125-137)/(61-74) 125/67 mmHg (01/16 0436) SpO2:  [93 %-96 %] 93 % (01/16 0436) Weight:  [187 lb 9.8 oz (85.1 kg)] 187 lb 9.8 oz (85.1 kg) (01/16 0436)  Exam: Gen: NAD, alert, cooperative with exam, sleeping comfortably in bed HEENT: NCAT, EOMI, MMM Neck:  no tenderness to palpation of Cervical spine, full ROM CV: RRR, good S1/S2 with additional S3 Resp: CTABL, no wheezes, non-labored Abd: Soft, NTND, + BS Ext: 1+ pitting edema on RLE, warm; dressing in place to right wrist/hand and right food-clean/dry/intact Neuro: Alert, oriented/appropriate in conversation, face symmetric;  Speaks easily  I have reviewed the patient's medications, labs, imaging, and diagnostic testing.  Notable results are summarized below.  CBC BMET   Lab 10/04/12 0525 10/03/12 0500 10/02/12 0630  WBC 8.9 9.8 12.1*  HGB 8.8* 9.2* 8.9*  HCT 26.8* 27.7* 26.6*  PLT 313 320 275    Lab 10/05/12 0516 10/01/12 0655 09/30/12 0511  NA 139 136 137  K 2.8* 3.7 4.0  CL 102 100 102  CO2 27 24 26   BUN 8 14 20   CREATININE 0.77 0.79 0.84  GLUCOSE 97 95 81  CALCIUM 8.1* 8.1* 7.9*    Blood Culture 12/26- No growth- final Blood culture 12/30- No growth- final C. Diff 1/4 - negative  Imaging/Diagnostic Tests: TEE  09/18/2012 - The estimated ejection fraction was in the range of 60% to 65%. - Aortic valve: No evidence of vegetation. - Mitral valve: Large 2.5cm diameter vegetation - Left atrium: No evidence of thrombus in the atrial cavity or appendage. - Right atrium: No evidence of thrombus in the atrial cavity or appendage. - Tricuspid valve: No evidence of vegetation. - Pulmonic valve: No evidence of vegetation. - Pericardium, extracardiac: A small, free-flowing pericardial effusion was identified along the right atrial free wall.  MRI Head 12/27 Several small areas of acute infarction involving the right  hemisphere including portions of the; right occipital lobe, right  temporal lobe, right opercular region, posterior right frontal lobe  and right parietal lobe.  MRI Cervical Spine 12/27 C6-7 diskitis with spread of infection to the adjacent facet  joints, spinous process and interspinous ligaments/posterior  paraspinal musculature as discussed above. Epidural abscess  suspected although evaluation limited without contrast.  CXR 09/26/2012 IMPRESSION:  Postoperative changes with support apparatus as described.  Left pleural effusion and bibasilar atelectasis - no evidence of  pneumothorax.  CXR 09/27/2012 IMPRESSION:  1. Somewhat low lung volumes with bibasilar air space disease,  left greater than right, and probable small left pleural effusion.  2. No definite pneumothorax.  Venous duplex 09/28/2012 Summary: Findings consistent with deep vein thrombosis involving the right mid femoral vein (behind a valve leaflet), the right gastrocnemius vein and the left gastrocnemius vein.   Plan: Ryan [name] is a 67 y.o.  year old male with acute encephalopathy secondary to sepsis from multiple sources.  Now post-op from mitral valve replacement on 09/26/2012. Now also post angio of his LE. Post op pain well controlled on oral meds and PT going well to this point. Will begin looking for SNF  placement and arranging f/u appts with specialists.   PAD/Ischemic limb: s/p LE angio on 10/03/2012 - Limb warm - Vascular on board- appreciate recommendations  - R popliteal artery occlusion with single runoff would be very difficult endovascualr recanalization, will re-evaluate in 2-3 weeks and intervene with endovascular vs bypass at that time if wound is still not healing.  - concern on admission that LE was ischemic.  - R ABI 0.48 on 1/2/214, L ABI WNL - f/u appt made  Post-op Anemia  - Hgb dropped to 7.1 on 1/11, now stable at 8.8 post transfusion 2 units PRBC on 1/11, Hgb pending today - fecal occult blood negative  DVT - BL DVT likely from bedbound state since admission - Heparin drip per Dr. Cyndia Bent, transitioning to PO warfarin and lovenox bridge today, postponed previously for vasular intervention.  - INR theraputic  CV- Stable off pressors - s/p Mitral valve replacement day 8 - Cardiology and CTS on board, greatly appreciate recommendations - + 2.4L for admission, for the last 3 days - Heart cath on 09/21/2012, Findings:  - Widely patent coronary arteries, pulmonary HTN with PCWP of 47mmHg  - Post-op pain- PRN oxycodone and tramadol   Afib with RVR- Pericarditis - Afib likely due to pericarditis, pericarditis improving - causing Afib, currently in NSR  with amiodarone - continue per cardiology, Amiodarone 200  Mg daily - TEE on 12/30 confirmed MV vegetation listed below and slight improvement of pericarditis  Acute Diastolic Heart failure - will likely improve s/p MVR (done 1/7), improving evidenced by improvement in dyspnea  - secondary to mitral regurgitation 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  - Strict I/O, daily weights  Resp-  - stable on RA - no complaints of dyspnea currently  - CXR with Small L pleural effusion post op, chest tube placed by CTS 1/8 and removed 1/9  ID - metastatic infection - ID on Board-   -  Continue Ancef, likely will need 6-8 weeks  - per Dr. 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  - Resolved, follow  daily BMET  Elevated Transaminases  - Pt AST:ALT 2:1 consistent with alcoholic picture. EtOH on admission < 11  - consider recheck LFT's  Newly Dx DM II - A1C 6.5 on check  - Insulin drip 1/8, transitioned to subq insulin post op, now monitoring CBGs and SSI if needed - CBG's 95-110 - Consider sensitive SSI if starts to develop hyperglycemia, Blood glucose fine for now.   FEN/GI:  - Carb modified diet  - KVO NS - Strict I/O  Prophylaxis: dc SCD, anticoagulated Disposition: tele,  CIR vs SNF with PT after workup.   Kenn File, MD 10/05/2012, 8:02 AM

## 2012-10-05 NOTE — Progress Notes (Signed)
EPW pulled at 1605 per MD order. All wires intact. No arrhythmias noted. Vital signs stable. BP 115/67. HR 88 Pt tolerated well. Chest tube sutures d/c per MD order. Steri strips applied. Sites WNL. Bed rest until 1705. Call bell in reach. Ryan [name]

## 2012-10-05 NOTE — Progress Notes (Signed)
I discussed with  Dr Bradshaw.  I agree with their plans documented in their progress note for today.  

## 2012-10-06 LAB — PROTIME-INR
INR: 2.51 — ABNORMAL HIGH (ref 0.00–1.49)
Prothrombin Time: 25.9 seconds — ABNORMAL HIGH (ref 11.6–15.2)

## 2012-10-06 LAB — CBC
HCT: 26.1 % — ABNORMAL LOW (ref 39.0–52.0)
Hemoglobin: 8.5 g/dL — ABNORMAL LOW (ref 13.0–17.0)
MCH: 24.4 pg — ABNORMAL LOW (ref 26.0–34.0)
MCHC: 32.6 g/dL (ref 30.0–36.0)
MCV: 74.8 fL — ABNORMAL LOW (ref 78.0–100.0)
Platelets: 323 10*3/uL (ref 150–400)
RBC: 3.49 MIL/uL — ABNORMAL LOW (ref 4.22–5.81)
RDW: 22.7 % — ABNORMAL HIGH (ref 11.5–15.5)
WBC: 7.2 10*3/uL (ref 4.0–10.5)

## 2012-10-06 LAB — BASIC METABOLIC PANEL
BUN: 8 mg/dL (ref 6–23)
CO2: 26 mEq/L (ref 19–32)
Calcium: 8.2 mg/dL — ABNORMAL LOW (ref 8.4–10.5)
Chloride: 103 mEq/L (ref 96–112)
Creatinine, Ser: 0.66 mg/dL (ref 0.50–1.35)
GFR calc Af Amer: >90 mL/min (ref >90)
GFR calc non Af Amer: >90 mL/min (ref >90)
Glucose, Bld: 98 mg/dL (ref 70–99)
Potassium: 3.3 mEq/L — ABNORMAL LOW (ref 3.5–5.1)
Sodium: 138 mEq/L (ref 135–145)

## 2012-10-06 LAB — HEMOGLOBIN AND HEMATOCRIT, BLOOD
HCT: 30.1 % — ABNORMAL LOW (ref 39.0–52.0)
Hemoglobin: 9.6 g/dL — ABNORMAL LOW (ref 13.0–17.0)

## 2012-10-06 LAB — GLUCOSE, CAPILLARY
Glucose-Capillary: 87 mg/dL (ref 70–99)
Glucose-Capillary: 98 mg/dL (ref 70–99)

## 2012-10-06 MED ORDER — POLYSACCHARIDE IRON COMPLEX 150 MG PO CAPS: 150.0000 mg | ORAL_CAPSULE | Freq: Every day | ORAL | Status: DC

## 2012-10-06 MED ORDER — POTASSIUM CHLORIDE CRYS ER 20 MEQ PO TBCR: 40.0000 meq | EXTENDED_RELEASE_TABLET | Freq: Every day | ORAL | Status: DC

## 2012-10-06 MED ORDER — AMLODIPINE BESYLATE 2.5 MG PO TABS: 2.5000 mg | ORAL_TABLET | Freq: Every day | ORAL | Status: DC

## 2012-10-06 MED ORDER — ENSURE COMPLETE PO LIQD: 237.0000 mL | Freq: Two times a day (BID) | ORAL | Status: DC

## 2012-10-06 MED ORDER — METOPROLOL TARTRATE 12.5 MG HALF TABLET: 12.5000 mg | ORAL_TABLET | Freq: Two times a day (BID) | ORAL | Status: DC

## 2012-10-06 MED ORDER — ATORVASTATIN CALCIUM 40 MG PO TABS: 40.0000 mg | ORAL_TABLET | Freq: Every day | ORAL | Status: AC

## 2012-10-06 MED ORDER — PRO-STAT SUGAR FREE PO LIQD: 30.0000 mL | Freq: Two times a day (BID) | ORAL | Status: DC

## 2012-10-06 MED ORDER — WARFARIN SODIUM 2.5 MG PO TABS
2.5000 mg | ORAL_TABLET | Freq: Once | ORAL | Status: AC
  Administered 2012-10-06: 2.5 mg via ORAL
  Filled 2012-10-06: qty 1

## 2012-10-06 MED ORDER — OXYCODONE HCL 5 MG PO TABS: 5.0000 mg | ORAL_TABLET | ORAL | Status: DC | PRN

## 2012-10-06 MED ORDER — WARFARIN SODIUM 2.5 MG PO TABS: 2.5000 mg | ORAL_TABLET | Freq: Once | ORAL | Status: DC

## 2012-10-06 MED ORDER — AMIODARONE HCL 200 MG PO TABS: 200.0000 mg | ORAL_TABLET | Freq: Every day | ORAL | Status: DC

## 2012-10-06 MED ORDER — BISACODYL 5 MG PO TBEC: 10.0000 mg | DELAYED_RELEASE_TABLET | Freq: Every day | ORAL | Status: DC | PRN

## 2012-10-06 MED ORDER — TRAMADOL HCL 50 MG PO TABS: 50.0000 mg | ORAL_TABLET | ORAL | Status: DC | PRN

## 2012-10-06 MED ORDER — HEPARIN SOD (PORK) LOCK FLUSH 100 UNIT/ML IV SOLN
250.0000 [IU] | INTRAVENOUS | Status: AC | PRN
  Administered 2012-10-06: 250 [IU]

## 2012-10-06 MED ORDER — CEFAZOLIN SODIUM-DEXTROSE 2-3 GM-% IV SOLR: 2.0000 g | Freq: Three times a day (TID) | INTRAVENOUS | Status: DC

## 2012-10-06 MED ORDER — PANTOPRAZOLE SODIUM 40 MG PO TBEC: 40.0000 mg | DELAYED_RELEASE_TABLET | Freq: Every day | ORAL | Status: DC

## 2012-10-06 MED ORDER — COLCHICINE 0.6 MG PO TABS: 0.6000 mg | ORAL_TABLET | Freq: Two times a day (BID) | ORAL | Status: DC

## 2012-10-06 MED ORDER — DSS 100 MG PO CAPS: 200.0000 mg | ORAL_CAPSULE | Freq: Every day | ORAL | Status: DC

## 2012-10-06 NOTE — Progress Notes (Addendum)
                    [address]            [place] [address]          [phone]     3 Days Post-Op Procedure(s) (LRB): ABDOMINAL AORTAGRAM (N/A)  Subjective: No complaints.   Objective: Vital signs in last 24 hours: Patient Vitals for the past 24 hrs:  BP Temp Temp src Pulse Resp SpO2  10/06/12 0358 130/63 mmHg 97.7 F (36.5 C) Oral 85  18  100 %  10/05/12 2045 124/61 mmHg 98.6 F (37 C) Oral 95  18  99 %  10/05/12 1632 128/69 mmHg - - 89  - -  10/05/12 1612 127/65 mmHg - - 88  - -  10/05/12 1605 115/67 mmHg - - 88  - -  10/05/12 1350 125/68 mmHg 98 F (36.7 C) Oral 81  18  99 %   Current Weight  10/05/12 187 lb 9.8 oz (85.1 kg)  PRE-OPERATIVE WEIGHT: 92 kg    Intake/Output from previous day: 01/16 0701 - 01/17 0700 In: 603 [P.O.:600; I.V.:3] Out: 551 [Urine:550; Stool:1]    PHYSICAL EXAM:  Heart: RRR Lungs: Clear Wound: Clean and dry Extremities: Trace LE edema   Lab Results: CBC: Basename 10/06/12 0521 10/05/12 1145  WBC 7.2 8.9  HGB 8.5* 9.7*  HCT 26.1* 29.7*  PLT 323 300   BMET:  Basename 10/06/12 0521 10/05/12 1755  NA 138 136  K 3.3* 3.0*  CL 103 100  CO2 26 26  GLUCOSE 98 125*  BUN 8 9  CREATININE 0.66 0.67  CALCIUM 8.2* 8.2*    PT/INR:  Basename 10/06/12 0521  LABPROT 25.9*  INR 2.51*      Assessment/Plan: S/P Procedure(s) (LRB): ABDOMINAL AORTAGRAM (N/A) CV- SR on Amio, Lopressor. Hypokalemia- replace K+. DVT- INR therapeutic.  Continue Coumadin. Endocarditis/MSSA bacteremia- Continue Ancef. Disp- ok for d/c to SNF from our standpoint at any time.  Will arrange OP f/u.   LOS: 24 days    COLLINS,GINA H 10/06/2012    Chart reviewed, patient examined, agree with above. Goal INR is 2-2.5

## 2012-10-06 NOTE — Discharge Summary (Signed)
I have seen and examined this patient. I have discussed with Dr Wendi Snipes.  I agree with their findings and plans as documented in their discharge note.

## 2012-10-06 NOTE — Progress Notes (Signed)
[facility]AR here to transport patient to [facility].

## 2012-10-06 NOTE — Progress Notes (Signed)
Family Medicine Teaching Service Daily Progress Note Service Page: [phone]  Patient Assessment: 67 y.o. year old male with acute encephalopathy (now resolved) secondary to sepsis from multiple sources, s/p I&D of R wrist and R MTP lesion (with HW removal), Multiple small R hemisphere infarcts that are likely septic emboli, C6-T1 Diskitis, pericarditis,  Endocarditis (s/p MVR), and E. Coli UTI (s/p treatment). Now s/p mitral valve replacement on 1/7.  Subjective:  Feeling pretty well today, some neck soreness, no obvious sources of bleeding. No nosebleed or hematochezia.   Objective: Temp:  [97.7 F (36.5 C)-98.6 F (37 C)] 97.7 F (36.5 C) (01/17 0358) Pulse Rate:  [81-95] 85  (01/17 0358) Resp:  [18] 18  (01/17 0358) BP: (115-130)/(61-69) 130/63 mmHg (01/17 0358) SpO2:  [99 %-100 %] 100 % (01/17 0358)  Exam: Gen: NAD, alert, cooperative with exam, sleeping comfortably in bed HEENT: NCAT, EOMI, MMM Neck:  no tenderness to palpation of Cervical spine, full ROM CV: RRR, good S1/S2 with additional S3 Resp: CTABL, no wheezes, non-labored Abd: Soft, NTND, + BS Ext: 1+ pitting edema on RLE, warm; dressing in place to right wrist/hand and right food-clean/dry/intact Neuro: Alert, oriented/appropriate in conversation, face symmetric;  Speaks easily  I have reviewed the patient's medications, labs, imaging, and diagnostic testing.  Notable results are summarized below.  CBC BMET   Lab 10/06/12 0521 10/05/12 1145 10/04/12 0525  WBC 7.2 8.9 8.9  HGB 8.5* 9.7* 8.8*  HCT 26.1* 29.7* 26.8*  PLT 323 300 313    Lab 10/06/12 0521 10/05/12 1755 10/05/12 0516  NA 138 136 139  K 3.3* 3.0* 2.8*  CL 103 100 102  CO2 26 26 27   BUN 8 9 8   CREATININE 0.66 0.67 0.77  GLUCOSE 98 125* 97  CALCIUM 8.2* 8.2* 8.1*    Blood Culture 12/26- No growth- final Blood culture 12/30- No growth- final C. Diff 1/4 - negative  Imaging/Diagnostic Tests: TEE 09/18/2012 - The estimated ejection fraction  was in the range of 60% to 65%. - Aortic valve: No evidence of vegetation. - Mitral valve: Large 2.5cm diameter vegetation - Left atrium: No evidence of thrombus in the atrial cavity or appendage. - Right atrium: No evidence of thrombus in the atrial cavity or appendage. - Tricuspid valve: No evidence of vegetation. - Pulmonic valve: No evidence of vegetation. - Pericardium, extracardiac: A small, free-flowing pericardial effusion was identified along the right atrial free wall.  MRI Head 12/27 Several small areas of acute infarction involving the right  hemisphere including portions of the; right occipital lobe, right  temporal lobe, right opercular region, posterior right frontal lobe  and right parietal lobe.  MRI Cervical Spine 12/27 C6-7 diskitis with spread of infection to the adjacent facet  joints, spinous process and interspinous ligaments/posterior  paraspinal musculature as discussed above. Epidural abscess  suspected although evaluation limited without contrast.  CXR 09/26/2012 IMPRESSION:  Postoperative changes with support apparatus as described.  Left pleural effusion and bibasilar atelectasis - no evidence of  pneumothorax.  CXR 09/27/2012 IMPRESSION:  1. Somewhat low lung volumes with bibasilar air space disease,  left greater than right, and probable small left pleural effusion.  2. No definite pneumothorax.  Venous duplex 09/28/2012 Summary: Findings consistent with deep vein thrombosis involving the right mid femoral vein (behind a valve leaflet), the right gastrocnemius vein and the left gastrocnemius vein.   Plan: Ryan [name] is a 67 y.o. year old male with acute encephalopathy secondary to sepsis from multiple sources.  Now post-op from mitral valve replacement on 09/26/2012. Now also post angio of his LE. Post op pain well controlled on oral meds and PT going well to this point. Will begin looking for SNF placement and arranging f/u appts with specialists.    PAD/Ischemic limb: s/p LE angio on 10/03/2012 - Limb warm - Vascular on board- appreciate recommendations  - R popliteal artery occlusion with single runoff would be very difficult endovascualr recanalization, will re-evaluate in 2-3 weeks and intervene with endovascular vs bypass at that time if wound is still not healing.  - concern on admission that LE was ischemic.  - R ABI 0.48 on 1/2/214, L ABI WNL - f/u appt made  Post-op Anemia  - Hgb dropped to 7.1 on 1/11, decreased  From 9.7 to 8.5 - recheck H/H at 10am - post transfusion 2 units PRBC on 1/11, Hgb pending today - fecal occult blood negative - No hematochezia, nose bleed or other obvious source of bleed  DVT - BL DVT likely from bedbound state since admission - Heparin drip per Dr. Cyndia Bent, transitioning to PO warfarin and lovenox bridge today, postponed previously for vasular intervention.  - INR theraputic  CV- Stable off pressors - s/p Mitral valve replacement day 9 - Cardiology and CTS on board, greatly appreciate recommendations - + 2.4L for admission, for the last 3 days - Heart cath on 09/21/2012, Findings:  - Widely patent coronary arteries, pulmonary HTN with PCWP of 64mmHg  - Post-op pain- PRN oxycodone and tramadol   Afib with RVR- Pericarditis - Afib likely due to pericarditis, pericarditis improving - causing Afib, currently in NSR  with amiodarone - continue per cardiology, Amiodarone 200  Mg daily - TEE on 12/30 confirmed MV vegetation listed below and slight improvement of pericarditis  Acute Diastolic Heart failure - will likely improve s/p MVR (done 1/7), improving evidenced by improvement in dyspnea  - secondary to mitral regurgitation 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  - Strict I/O, daily weights  Resp-  - stable on RA - no complaints of dyspnea currently  - CXR with Small L pleural effusion post op, chest tube placed by CTS 1/8 and removed 1/9  ID -  metastatic infection - ID on Board-   - Continue Ancef, likely will need 6-8 weeks  - per Dr. 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  -  Resolved, follow daily BMET  Elevated Transaminases  - Pt AST:ALT 2:1 consistent with alcoholic picture. EtOH on admission < 11  - consider recheck LFT's  Newly Dx DM II - A1C 6.5 on check  - Insulin drip 1/8, transitioned to subq insulin post op, now monitoring CBGs and SSI if needed - CBG's 87-107 - Consider sensitive SSI if starts to develop hyperglycemia, Blood glucose fine for now.   FEN/GI:  - Carb modified diet  - KVO NS - Strict I/O  Prophylaxis: dc SCD, anticoagulated Disposition: tele,  CIR vs SNF with PT after workup.   Kenn File, MD 10/06/2012, 8:33 AM

## 2012-10-06 NOTE — Progress Notes (Signed)
Physical Therapy Treatment Patient Details Name: Ryan [name] MRN: [id number] DOB: [birth year] Today's Date: 10/06/2012 Time: [id number] PT Time Calculation (min): 24 min  PT Assessment / Plan / Recommendation Comments on Treatment Session  Pt with acute encephalopathy with decr mobility secondary to decr postural stability, decr balance and decr endurance.  Will benefit from PT to address endurance and balance issues.      Follow Up Recommendations  SNF;Supervision/Assistance - 24 hour                 Equipment Recommendations   (right PFRW)        Frequency Min 3X/week   Plan Discharge plan remains appropriate;Frequency remains appropriate    Precautions / Restrictions Precautions Precautions: Fall;Sternal Precaution Comments: stiff R hand Restrictions Weight Bearing Restrictions: Yes RUE Weight Bearing: Weight bear through elbow only RLE Weight Bearing: Weight bearing as tolerated   Pertinent Vitals/Pain VSS, No pain    Mobility  Bed Mobility Bed Mobility: Not assessed (pt up in chair) Transfers Transfers: Sit to Stand;Stand to Sit Sit to Stand: 3: Mod assist;With upper extremity assist;From chair/3-in-1 Stand to Sit: 4: Min assist;With upper extremity assist;To chair/3-in-1 Stand Pivot Transfers: Not tested (comment) Details for Transfer Assistance: Difficult for pt to get up from recliner.  Mod A given on right side and pt pushed on arm rest with LUE.  Needs cues for forward translation of weight for sit to stand.  Needed assist with RUE to get off of PFRW to sit with uncontrolled descent into chair.   Ambulation/Gait Ambulation/Gait Assistance: 4: Min guard Ambulation Distance (Feet): 250 Feet Assistive device: Right platform walker Ambulation/Gait Assistance Details: Constant verbal cues for posture given as pt tends to lean onto platform.  Pt with good speed throughout ambulation today.  HR 121.  No problems with endurance today.   Gait Pattern: Step-through  pattern;Decreased step length - right;Decreased stance time - right;Trunk flexed Gait velocity: decreased Stairs: No Wheelchair Mobility Wheelchair Mobility: No    Exercises General Exercises - Lower Extremity Ankle Circles/Pumps: AROM;Both;10 reps;Seated Long Arc Quad: AROM;Both;10 reps;Seated Hip Flexion/Marching: AROM;Both;10 reps;Seated    PT Goals Acute Rehab PT Goals PT Goal: Sit to Stand - Progress: Progressing toward goal PT Goal: Stand to Sit - Progress: Progressing toward goal PT Transfer Goal: Bed to Chair/Chair to Bed - Progress: Progressing toward goal PT Goal: Stand - Progress: Progressing toward goal PT Goal: Ambulate - Progress: Progressing toward goal  Visit Information  Last PT Received On: 10/06/12 Assistance Needed: +1    Subjective Data  Subjective: "I will try to walk."   Cognition  Overall Cognitive Status: Impaired Area of Impairment: Memory;Problem solving Arousal/Alertness: Awake/alert Orientation Level: Appears intact for tasks assessed Behavior During Session: [facility] for tasks performed Safety/Judgement: Decreased safety judgement for tasks assessed Safety/Judgement - Other Comments: Needs constant cuing for balance and posture Awareness of Deficits: Pt remembered to not use right UE with sit to stand to sit    Balance  Static Sitting Balance Static Sitting - Level of Assistance: Not tested (comment) Static Standing Balance Static Standing - Balance Support: Bilateral upper extremity supported;During functional activity Static Standing - Level of Assistance: 5: Stand by assistance Static Standing - Comment/# of Minutes: Can stand with right platform RW with stand by assist statically without LOB Dynamic Standing Balance Dynamic Standing - Balance Support: Bilateral upper extremity supported;During functional activity Dynamic Standing - Level of Assistance: 4: Min assist Dynamic Standing - Balance Activities: Lateral lean/weight shifting;Forward  lean/weight shifting  Dynamic Standing - Comments: Needs steadying assist  End of Session PT - End of Session Equipment Utilized During Treatment: Gait belt Activity Tolerance: Patient tolerated treatment well Patient left: in chair;with call bell/phone within reach Nurse Communication: Mobility status        INGOLD,Tamlyn Sides 10/06/2012, 11:35 AM  [facility] [phone] (pager)

## 2012-10-06 NOTE — Clinical Social Work Note (Signed)
CSW following to assist with dc to SNF. Pt has confirmed SNF bed at [facility] Pt's preauthorization for SNF is still pending. CSW will f/u with insurance liaison to request priority review.    Ryan [name], [place]

## 2012-10-06 NOTE — Progress Notes (Signed)
Pt for discharge to [facility] for rehab. PICC line in place for continued antibiotics.

## 2012-10-06 NOTE — Progress Notes (Signed)
I have seen and examined this patient. I have discussed with Dr Ma Rings.  I agree with their findings and plans as documented in their progress note.

## 2012-10-06 NOTE — Progress Notes (Signed)
Ryan [name] is lying in bed. He is just had a bowel movement and could not get a nurse to assist them fast enough. He is embarrassed.  He denies cardiovascular symptoms. He has not had palpitations, chest pain, and exertional dyspnea is improving.  The cardiac exam reveals no rub or murmur.  Monitor reveals no arrhythmia.  Continue current cardiac therapy. I will decrease the amiodarone dose to 200 mg per day and we will continue this medication for approximately 4-6 weeks allowing time to completely heal inflammation that was the likely source of the atrial arrhythmia.

## 2012-10-06 NOTE — Progress Notes (Signed)
[id number] Cardiac Rehab Completed discharge education with pt. He voices understanding. Pt agrees to [facility]. CRP in [place], will send referral.

## 2012-10-06 NOTE — Clinical Social Work Note (Signed)
SNF received authorization from insurance for Pt. Everything is ready for Pt to go to SNF today if medically cleared.   Chrys Racer, [place]

## 2012-10-06 NOTE — Care Management Note (Signed)
    Page 1 of 2   10/06/2012     2:37:32 PM   CARE MANAGEMENT NOTE 10/06/2012  Patient:  Ryan [name], Ryan [name]   Account Number:  [id number]  Date Initiated:  09/19/2012  Documentation initiated by:  Marvetta Gibbons  Subjective/Objective Assessment:   Pt admitted with sepsis, s/p I/D fo wrist and foot, endocarditis     Action/Plan:   PTA pt lived at home- will need PT/OT evals when medically stable   Anticipated DC Date:  10/06/2012   Anticipated DC Plan:  SKILLED NURSING FACILITY  In-house referral  Clinical Social Worker      DC Planning Services  CM consult      Choice offered to / List presented to:             Status of service:  Completed, signed off Medicare Important Message given?   (If response is "NO", the following Medicare IM given date fields will be blank) Date Medicare IM given:   Date Additional Medicare IM given:    Discharge Disposition:  [place]  Per UR Regulation:  Reviewed for med. necessity/level of care/duration of stay  If discussed at [place] of Stay Meetings, dates discussed:   09/19/2012  10/03/2012  10/05/2012    Comments:  10/06/12 Baelynn Schmuhl,RN,BSN [id number] [place], PER CSW ARRANGEMENTS, PENDING INSURANCE AUTHORIZATION.  1/2 debbie dowell rn,bsn phy ther rec cir, cir rec snf, sw assisting w snf bed search.  09/19/12- 1100- Marvetta Gibbons RN BSN [phone] Pt with EKG this am showing acute MI- may need  mitral valve surgery for vegetation removal and repair of regurgitation or replacement of the valve- TCTS consulted - continues on IV abx,

## 2012-10-09 NOTE — Clinical Social Work Placement (Signed)
Clinical Social Work Department CLINICAL SOCIAL WORK PLACEMENT NOTE 10/09/2012  Patient:  Ryan [name], Ryan [name]  Account Number:  [id number] Admit date:  09/12/2012  Clinical Social Worker:  Hunt Oris, Latanya Presser  Date/time:  09/21/2012 11:45 AM  Clinical Social Work is seeking post-discharge placement for this patient at the following level of care:   SKILLED NURSING   (*CSW will update this form in Epic as items are completed)   09/21/2012  Patient/family provided with [place] Department of Clinical Social Work's list of facilities offering this level of care within the geographic area requested by the patient (or if unable, by the patient's family).  09/21/2012  Patient/family informed of their freedom to choose among providers that offer the needed level of care, that participate in Medicare, Medicaid or managed care program needed by the patient, have an available bed and are willing to accept the patient.  09/21/2012  Patient/family informed of [facility]' ownership interest in [facility], as well as of the fact that they are under no obligation to receive care at this facility.  PASARR submitted to EDS on 09/21/2012 PASARR number received from EDS on 09/21/2012  FL2 transmitted to all facilities in geographic area requested by pt/family on  09/21/2012 FL2 transmitted to all facilities within larger geographic area on   Patient informed that his/her managed care company has contracts with or will negotiate with  certain facilities, including the following:     Patient/family informed of bed offers received:  10/03/2012 Patient chooses bed at [facility] Physician recommends and patient chooses bed at    Patient to be transferred to [place] on  10/06/2012 Patient to be transferred to facility by [facility]  The following physician request were entered in Epic:   Additional Comments:

## 2012-10-12 NOTE — Progress Notes (Signed)
I agree with the above  Ryan [name] 

## 2012-10-16 ENCOUNTER — Ambulatory Visit: Payer: Medicare PPO | Admitting: Surgery

## 2012-10-20 ENCOUNTER — Encounter: Payer: Self-pay | Admitting: Surgery

## 2012-10-23 ENCOUNTER — Encounter: Payer: Self-pay | Admitting: Surgery

## 2012-10-23 ENCOUNTER — Ambulatory Visit (INDEPENDENT_AMBULATORY_CARE_PROVIDER_SITE_OTHER): Payer: Medicare PPO | Admitting: Surgery

## 2012-10-23 VITALS — BP 113/69 | HR 88 | Ht 72.0 in | Wt 188.0 lb

## 2012-10-23 DIAGNOSIS — L98499 Non-pressure chronic ulcer of skin of other sites with unspecified severity: Secondary | ICD-10-CM

## 2012-10-23 DIAGNOSIS — I7025 Atherosclerosis of native arteries of other extremities with ulceration: Secondary | ICD-10-CM | POA: Insufficient documentation

## 2012-10-23 DIAGNOSIS — Z01810 Encounter for preprocedural cardiovascular examination: Secondary | ICD-10-CM

## 2012-10-23 DIAGNOSIS — I739 Peripheral vascular disease, unspecified: Secondary | ICD-10-CM

## 2012-10-23 NOTE — Progress Notes (Signed)
[facility]   Patient name: Ryan [name] MRN: [id number] DOB: 1946/03/04 Sex: male     Chief Complaint  Patient presents with  . Re-evaluation    s/p abdominal aortogram 10/03/2012 - R foot wound    HISTORY OF PRESENT ILLNESS: The patient is back today for followup. He was recently discharged from the hospital. He had a very complicated course. He was originally transferred for possible right leg ischemia. 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 He also had infected hardware removed from his right foot. He has developed a wound on his right foot. He underwent angiography which revealed an occluded right popliteal artery. Because of what he had been through I elected not to put into a bypass at that time but rather to follow his wound to see if it had adequate circulation to heal. He is on Coumadin and antibiotics   Past Medical History  Diagnosis Date  . Hypertension     Past Surgical History  Procedure Date  . I&d extremity 09/14/2012    Procedure: IRRIGATION AND DEBRIDEMENT EXTREMITY;  Surgeon: Tennis Must, MD;  Location: [place];  Service: Orthopedics;  Laterality: Right;  . Extremity wire/pin removal 09/14/2012    Procedure: REMOVAL K-WIRE/PIN EXTREMITY;  Surgeon: [name], MD;  Location: [facility];  Service: Orthopedics;  Laterality: Right;  Right Foot  . Tee without cardioversion 09/18/2012    Procedure: TRANSESOPHAGEAL ECHOCARDIOGRAM (TEE);  Surgeon: [name], MD;  Location: [facility] ENDOSCOPY;  Service: Cardiovascular;  Laterality: N/A;  . Mitral valve replacement 09/26/2012    Procedure: MITRAL VALVE (MV) REPLACEMENT;  Surgeon: [name], MD;  Location: [place];  Service: Open Heart Surgery;  Laterality: N/A;  . Intraoperative transesophageal echocardiogram 09/26/2012    Procedure: INTRAOPERATIVE TRANSESOPHAGEAL ECHOCARDIOGRAM;  Surgeon: [name], MD;  Location: [facility] OR;  Service: Open Heart Surgery;   Laterality: N/A;    History   Social History  . Marital Status: Married    Spouse Name: N/A    Number of Children: N/A  . Years of Education: N/A   Occupational History  . Not on file.   Social History Main Topics  . Smoking status: Never Smoker   . Smokeless tobacco: Never Used  . Alcohol Use: No  . Drug Use: No  . Sexually Active: Not on file   Other Topics Concern  . Not on file   Social History Narrative  . No narrative on file    History reviewed. No pertinent family history.  Allergies as of 10/23/2012  . (No Known Allergies)    Current Outpatient Prescriptions on File Prior to Visit  Medication Sig Dispense Refill  . amiodarone (PACERONE) 200 MG tablet Take 1 tablet (200 mg total) by mouth daily.  30 tablet  4  . amLODipine (NORVASC) 2.5 MG tablet Take 1 tablet (2.5 mg total) by mouth daily.  30 tablet  5  . atorvastatin (LIPITOR) 40 MG tablet Take 1 tablet (40 mg total) by mouth daily at 6 PM.  30 tablet  5  . bisacodyl (DULCOLAX) 5 MG EC tablet Take 2 tablets (10 mg total) by mouth daily as needed (Give daily if no BM).  60 tablet  5  . ceFAZolin (ANCEF) 2-3 GM-% SOLR Inject 50 mLs (2 g total) into the vein every 8 (eight) hours. Through Nov 07, 2012  100 each  0  . colchicine 0.6 MG tablet Take 1 tablet (0.6 mg  total) by mouth 2 (two) times daily.  60 tablet  5  . docusate sodium 100 MG CAPS Take 200 mg by mouth daily.  10 capsule  5  . iron polysaccharides (NIFEREX) 150 MG capsule Take 1 capsule (150 mg total) by mouth daily.  30 capsule  0  . metoprolol tartrate (LOPRESSOR) 12.5 mg TABS Take 0.5 tablets (12.5 mg total) by mouth 2 (two) times daily.  30 tablet  0  . oxyCODONE (OXY IR/ROXICODONE) 5 MG immediate release tablet Take 1-2 tablets (5-10 mg total) by mouth every 3 (three) hours as needed.  50 tablet  0  . pantoprazole (PROTONIX) 40 MG tablet Take 1 tablet (40 mg total) by mouth daily before breakfast.  30 tablet  0  . potassium chloride SA  (K-DUR,KLOR-CON) 20 MEQ tablet Take 2 tablets (40 mEq total) by mouth daily.  30 tablet  0  . traMADol (ULTRAM) 50 MG tablet Take 1 tablet (50 mg total) by mouth every 4 (four) hours as needed.  30 tablet  0  . warfarin (COUMADIN) 2.5 MG tablet Take 1 tablet (2.5 mg total) by mouth one time only at 6 PM.  30 tablet  0  . feeding supplement (ENSURE COMPLETE) LIQD Take 237 mLs by mouth 2 (two) times daily between meals.  60 Bottle  0  . feeding supplement (PRO-STAT SUGAR FREE 64) LIQD Take 30 mLs by mouth 2 (two) times daily.  900 mL  0     REVIEW OF SYSTEMS: Please see history of present illness. Other systems are negative  PHYSICAL EXAMINATION:   Vital signs are BP 113/69  Pulse 88  Ht 6' (1.829 m)  Wt 188 lb (85.276 kg)  BMI 25.50 kg/m2  SpO2 99% General: The patient appears their stated age. HEENT:  No gross abnormalities Pulmonary:  Non labored breathing Musculoskeletal: There are no major deformities. Neurologic: No focal weakness or paresthesias are detected, Skin: A 2.8 x 3.0 ulcer is seen on the right forefoot. There is pink granulation tissue at the periphery however there is 8 eschar within the middle. Psychiatric: The patient has normal affect. Cardiovascular: Pedal pulse are not palpable   Diagnostic Studies None today  Assessment: Right lower extremity ulcer on the forefoot, and the setting of peripheral vascular disease Plan: I discussed that more than likely he is going to require revascularization to get his wound to heal. His family is not here today to discuss this further. I told him that this is a limb threatening situation. We have decided to observe the wound for another 2 weeks, and then have him return for a repeat evaluation. He has tentatively been placed on the operative schedule for February 21. He will need vein mapping, which will be obtained when he comes back in 2 weeks. He will also have to be off of his Coumadin prior to his operation. If in 2 weeks  the wound is unchanged or looks worse, we will proceed with revascularization which will be an above-knee popliteal to peroneal artery bypass graft.  Eldridge Abrahams, M.D. [facility] [place] Office: [phone] Pager:  [phone]

## 2012-10-23 NOTE — Addendum Note (Signed)
Addended by: Mena Goes on: 10/23/2012 11:20 AM   Modules accepted: Orders

## 2012-11-03 ENCOUNTER — Other Ambulatory Visit: Payer: Self-pay | Admitting: *Deleted

## 2012-11-03 ENCOUNTER — Encounter: Payer: Self-pay | Admitting: Surgery

## 2012-11-03 DIAGNOSIS — I38 Endocarditis, valve unspecified: Secondary | ICD-10-CM

## 2012-11-06 ENCOUNTER — Encounter (INDEPENDENT_AMBULATORY_CARE_PROVIDER_SITE_OTHER): Payer: Medicare PPO | Admitting: *Deleted

## 2012-11-06 ENCOUNTER — Encounter: Payer: Self-pay | Admitting: Surgery

## 2012-11-06 ENCOUNTER — Ambulatory Visit (INDEPENDENT_AMBULATORY_CARE_PROVIDER_SITE_OTHER): Payer: Medicare PPO | Admitting: Surgery

## 2012-11-06 VITALS — BP 134/77 | HR 84 | Ht 72.0 in | Wt 188.0 lb

## 2012-11-06 DIAGNOSIS — I70209 Unspecified atherosclerosis of native arteries of extremities, unspecified extremity: Secondary | ICD-10-CM

## 2012-11-06 DIAGNOSIS — Z01810 Encounter for preprocedural cardiovascular examination: Secondary | ICD-10-CM

## 2012-11-06 DIAGNOSIS — I739 Peripheral vascular disease, unspecified: Secondary | ICD-10-CM

## 2012-11-06 DIAGNOSIS — L98499 Non-pressure chronic ulcer of skin of other sites with unspecified severity: Secondary | ICD-10-CM

## 2012-11-06 NOTE — Progress Notes (Signed)
Vascular and Vein Specialist of [facility]   Patient name: Ryan [name] MRN: [id number] DOB: 1945-12-08 Sex: male     Chief Complaint  Patient presents with  . Re-evaluation    2 wk f/u to discuss surgical options    HISTORY OF PRESENT ILLNESS: The patient is back today for followup. He was recently discharged from the hospital. He had a very complicated course. He was originally transferred for possible right leg ischemia. 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 He also had infected hardware removed from his right foot. He has developed a wound on his right foot. He underwent angiography which revealed an occluded right popliteal artery. Because of what he had been through I elected not to put into a bypass at that time but rather to follow his wound to see if it had adequate circulation to heal. He is on Coumadin and antibiotics. He is to get a vein mapping prior to seeing me. He remains in a nursing home. He has no complaints today. He has not had any fevers.   Past Medical History  Diagnosis Date  . Hypertension     Past Surgical History  Procedure Laterality Date  . I&d extremity  09/14/2012    Procedure: IRRIGATION AND DEBRIDEMENT EXTREMITY;  Surgeon: Tennis Must, MD;  Location: [place];  Service: Orthopedics;  Laterality: Right;  . Extremity wire/pin removal  09/14/2012    Procedure: REMOVAL K-WIRE/PIN EXTREMITY;  Surgeon: [name], MD;  Location: [place];  Service: Orthopedics;  Laterality: Right;  Right Foot  . Tee without cardioversion  09/18/2012    Procedure: TRANSESOPHAGEAL ECHOCARDIOGRAM (TEE);  Surgeon: [name], MD;  Location: [facility] ENDOSCOPY;  Service: Cardiovascular;  Laterality: N/A;  . Mitral valve replacement  09/26/2012    Procedure: MITRAL VALVE (MV) REPLACEMENT;  Surgeon: [name], MD;  Location: [place];  Service: Open Heart Surgery;  Laterality: N/A;  . Intraoperative transesophageal echocardiogram  09/26/2012     Procedure: INTRAOPERATIVE TRANSESOPHAGEAL ECHOCARDIOGRAM;  Surgeon: [name], MD;  Location: [facility] OR;  Service: Open Heart Surgery;  Laterality: N/A;    History   Social History  . Marital Status: Married    Spouse Name: N/A    Number of Children: N/A  . Years of Education: N/A   Occupational History  . Not on file.   Social History Main Topics  . Smoking status: Never Smoker   . Smokeless tobacco: Never Used  . Alcohol Use: No  . Drug Use: No  . Sexually Active: Not on file   Other Topics Concern  . Not on file   Social History Narrative  . No narrative on file    History reviewed. No pertinent family history.  Allergies as of 11/06/2012  . (No Known Allergies)    Current Outpatient Prescriptions on File Prior to Visit  Medication Sig Dispense Refill  . amiodarone (PACERONE) 200 MG tablet Take 1 tablet (200 mg total) by mouth daily.  30 tablet  4  . amLODipine (NORVASC) 2.5 MG tablet Take 1 tablet (2.5 mg total) by mouth daily.  30 tablet  5  . atorvastatin (LIPITOR) 40 MG tablet Take 1 tablet (40 mg total) by mouth daily at 6 PM.  30 tablet  5  . bisacodyl (DULCOLAX) 5 MG EC tablet Take 2 tablets (10 mg total) by mouth daily as needed (Give daily if no BM).  60 tablet  5  . ceFAZolin (ANCEF) 2-3 GM-% SOLR  Inject 50 mLs (2 g total) into the vein every 8 (eight) hours. Through Nov 07, 2012  100 each  0  . colchicine 0.6 MG tablet Take 1 tablet (0.6 mg total) by mouth 2 (two) times daily.  60 tablet  5  . docusate sodium 100 MG CAPS Take 200 mg by mouth daily.  10 capsule  5  . feeding supplement (ENSURE COMPLETE) LIQD Take 237 mLs by mouth 2 (two) times daily between meals.  60 Bottle  0  . feeding supplement (PRO-STAT SUGAR FREE 64) LIQD Take 30 mLs by mouth 2 (two) times daily.  900 mL  0  . iron polysaccharides (NIFEREX) 150 MG capsule Take 1 capsule (150 mg total) by mouth daily.  30 capsule  0  . metoprolol tartrate (LOPRESSOR) 12.5 mg TABS Take 0.5 tablets  (12.5 mg total) by mouth 2 (two) times daily.  30 tablet  0  . oxyCODONE (OXY IR/ROXICODONE) 5 MG immediate release tablet Take 1-2 tablets (5-10 mg total) by mouth every 3 (three) hours as needed.  50 tablet  0  . pantoprazole (PROTONIX) 40 MG tablet Take 1 tablet (40 mg total) by mouth daily before breakfast.  30 tablet  0  . potassium chloride SA (K-DUR,KLOR-CON) 20 MEQ tablet Take 2 tablets (40 mEq total) by mouth daily.  30 tablet  0  . traMADol (ULTRAM) 50 MG tablet Take 1 tablet (50 mg total) by mouth every 4 (four) hours as needed.  30 tablet  0  . warfarin (COUMADIN) 2.5 MG tablet Take 1 tablet (2.5 mg total) by mouth one time only at 6 PM.  30 tablet  0   No current facility-administered medications on file prior to visit.     REVIEW OF SYSTEMS: C no changes from prior visit  PHYSICAL EXAMINATION:   Vital signs are BP 134/77  Pulse 84  Ht 6' (1.829 m)  Wt 188 lb (85.276 kg)  BMI 25.49 kg/m2  SpO2 100% General: The patient appears their stated age. HEENT:  No gross abnormalities Pulmonary:  Non labored breathing Musculoskeletal: There are no major deformities. Neurologic: No focal weakness or paresthesias are detected, Skin: There is pink granulation tissue at the base of his wound. I debrided the skin and soft tissue today down to where I felt that all the dry eschar was removed. Psychiatric: The patient has normal affect. Cardiovascular: There is a regular rate and rhythm without significant murmur appreciated.   Diagnostic Studies Vein mapping was performed today. He has a very marginal saphenous vein on both legs.  Assessment: Vascular disease with ulceration, right forefoot Plan: I was very pleasantly surprised with the appearance of the wound today. I do feel like it looks better than it did 2 weeks ago. I discussed this with the family. In this setting, with his multiple medical problems, having just recovered from cardiac surgery, and being on Coumadin for a  valve replacement, I would like to do everything possible to avoid putting him through a lower extremity bypass. Since the wound continues to look better I think we can continue with wound care. At making him an appointment at the wound care center for further followup and evaluation. He is going to come back to see me in 3 weeks. The family was counseled about what to watch for guarding a deterioration in the wound. They will contact me if this occurs. Otherwise, I will see him in 3 weeks.  Eldridge Abrahams, M.D. Vascular and Vein Specialists  of [place] Office: [phone] Pager:  [phone]

## 2012-11-07 ENCOUNTER — Ambulatory Visit (INDEPENDENT_AMBULATORY_CARE_PROVIDER_SITE_OTHER): Payer: Medicare HMO | Admitting: Surgical

## 2012-11-07 ENCOUNTER — Ambulatory Visit
Admission: RE | Admit: 2012-11-07 | Discharge: 2012-11-07 | Disposition: A | Discharge status: Home / Self Care | Payer: Medicare PPO | Source: Ambulatory Visit | Attending: Surgery | Admitting: Surgery

## 2012-11-07 ENCOUNTER — Encounter: Payer: Self-pay | Admitting: Surgery

## 2012-11-07 ENCOUNTER — Ambulatory Visit: Payer: Medicare PPO | Admitting: Surgery

## 2012-11-07 VITALS — BP 106/60 | HR 72 | Temp 97.0°F | Resp 16 | Ht 72.0 in | Wt 165.0 lb

## 2012-11-07 DIAGNOSIS — I38 Endocarditis, valve unspecified: Secondary | ICD-10-CM

## 2012-11-07 DIAGNOSIS — I059 Rheumatic mitral valve disease, unspecified: Secondary | ICD-10-CM

## 2012-11-07 DIAGNOSIS — Z952 Presence of prosthetic heart valve: Secondary | ICD-10-CM

## 2012-11-07 DIAGNOSIS — Z954 Presence of other heart-valve replacement: Secondary | ICD-10-CM

## 2012-11-07 DIAGNOSIS — I340 Nonrheumatic mitral (valve) insufficiency: Secondary | ICD-10-CM

## 2012-11-07 NOTE — Patient Instructions (Signed)
Instructed to continue increasing ambulation as tolerated.

## 2012-11-07 NOTE — Progress Notes (Signed)
[address]            [place] [address]          [phone]       Ryan [name] Ryan [name] [id number] Date of Birth: 10-26-1945  Ryan [name], Ryan [name] Ryan [name], Ryan [name]  Chief Complaint:   PostOp Follow Up Visit   History of Present Illness:    Patient seen in routine office visit followup after mitral valve replacement with 27 mm Edwards pericardial magna ease valve. 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 The patient had a somewhat prolonged postoperative course but was discharged to a skilled nursing facility in stable condition. Currently he is doing quite well. He is not having a difficulty with shortness of breath or congestive failure symptoms. He is scheduled to stop antibiotics on tomorrow's date. He has had no recent fevers. He has no other constitutional symptoms. He takes minimal pain medication, only occasionally at night for sleep assistance. He is ambulating and this is improving over time.           History  Smoking status  . Never Smoker   Smokeless tobacco  . Never Used       No Known Allergies  Current Outpatient Prescriptions  Medication Sig Dispense Refill  . amiodarone (PACERONE) 200 MG tablet Take 1 tablet (200 mg total) by mouth daily.  30 tablet  4  . amLODipine (NORVASC) 2.5 MG tablet Take 1 tablet (2.5 mg total) by mouth daily.  30 tablet  5  . atorvastatin (LIPITOR) 40 MG tablet Take 1 tablet (40 mg total) by mouth daily at 6 PM.  30 tablet  5  . bisacodyl (DULCOLAX) 5 MG EC tablet Take 2 tablets (10 mg total) by mouth daily as needed (Give daily if no BM).  60 tablet  5  . ceFAZolin (ANCEF) 2-3 GM-% SOLR Inject 50 mLs (2 g total) into the vein every 8 (eight) hours. Through Nov 07, 2012  100 each  0  . colchicine 0.6 MG tablet Take 1 tablet (0.6 mg total) by mouth 2 (two) times daily.  60 tablet  5  . docusate sodium 100 MG  CAPS Take 200 mg by mouth daily.  10 capsule  5  . iron polysaccharides (NIFEREX) 150 MG capsule Take 1 capsule (150 mg total) by mouth daily.  30 capsule  0  . metoprolol tartrate (LOPRESSOR) 12.5 mg TABS Take 0.5 tablets (12.5 mg total) by mouth 2 (two) times daily.  30 tablet  0  . oxyCODONE (OXY IR/ROXICODONE) 5 MG immediate release tablet Take 1-2 tablets (5-10 mg total) by mouth every 3 (three) hours as needed.  50 tablet  0  . pantoprazole (PROTONIX) 40 MG tablet Take 1 tablet (40 mg total) by mouth daily before breakfast.  30 tablet  0  . potassium chloride SA (K-DUR,KLOR-CON) 20 MEQ tablet Take 2 tablets (40 mEq total) by mouth daily.  30 tablet  0  . traMADol (ULTRAM) 50 MG tablet Take 1 tablet (50 mg total) by mouth every 4 (four) hours as needed.  30 tablet  0  . warfarin (COUMADIN) 2.5 MG tablet Take 1 tablet (2.5 mg total) by mouth one time only at 6 PM.  30 tablet  0  . feeding supplement (ENSURE COMPLETE) LIQD Take 237 mLs by mouth  2 (two) times daily between meals.  60 Bottle  0  . feeding supplement (PRO-STAT SUGAR FREE 64) LIQD Take 30 mLs by mouth 2 (two) times daily.  900 mL  0   No current facility-administered medications for this visit.       Physical Exam: BP 106/60  Pulse 72  Temp(Src) 97 F (36.1 C) (Oral)  Resp 16  Ht 6' (1.829 m)  Wt 165 lb (74.844 kg)  BMI 22.37 kg/m2  SpO2 98%  General appearance: alert, cooperative and no distress Heart: regular rate and rhythm and No murmur or rub Lungs: clear to auscultation bilaterally Extremities: No peripheral edema Wound: Incisions healed well without evidence of infection. 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 Trula Slade and reported to be significantly improved.  Diagnostic Studies & Laboratory data:         Recent Radiology Findings: Dg Chest 2 View  11/07/2012  *RADIOLOGY REPORT*  Clinical Data: Status post  mitral valve surgery.  CHEST - 2 VIEW  Comparison: 09/29/2012.  Findings: The heart is enlarged but stable.  Stable surgical changes from mitral valve replacement surgery.  Left PICC line has pulled back slightly and the tip is in the proximal SVC.  No infiltrates, edema or effusions.  The bony thorax is intact.  IMPRESSION: Stable cardiac enlargement. No acute cardiopulmonary findings.   Original Report Authenticated By: Marijo Sanes, M.D.       Recent Labs: Lab Results  Component Value Date   WBC 7.2 10/06/2012   HGB 9.6* 10/06/2012   HCT 30.1* 10/06/2012   PLT 323 10/06/2012   GLUCOSE 98 10/06/2012   CHOL 121 09/16/2012   TRIG 174* 09/16/2012   HDL 6* 09/16/2012   LDLCALC 80 09/16/2012   ALT 18 09/25/2012   AST 49* 09/25/2012   NA 138 10/06/2012   K 3.3* 10/06/2012   CL 103 10/06/2012   CREATININE 0.66 10/06/2012   BUN 8 10/06/2012   CO2 26 10/06/2012   INR 2.51* 10/06/2012   HGBA1C 6.5* 09/14/2012      Assessment / Plan:  Patient is doing quite well from a surgical viewpoint. I instructed him that he should followup with Dr. Tamala Julian with cardiology and gave him contact information. We will see again on a when necessary basis for any surgically related issues or at request.          Ryan [name] E 11/07/2012 3:49 PM

## 2012-11-21 ENCOUNTER — Encounter (HOSPITAL_BASED_OUTPATIENT_CLINIC_OR_DEPARTMENT_OTHER): Payer: Medicare PPO | Attending: General Surgery

## 2012-11-21 DIAGNOSIS — I499 Cardiac arrhythmia, unspecified: Secondary | ICD-10-CM | POA: Insufficient documentation

## 2012-11-21 DIAGNOSIS — Z79899 Other long term (current) drug therapy: Secondary | ICD-10-CM | POA: Insufficient documentation

## 2012-11-21 DIAGNOSIS — I679 Cerebrovascular disease, unspecified: Secondary | ICD-10-CM | POA: Insufficient documentation

## 2012-11-21 DIAGNOSIS — Z86718 Personal history of other venous thrombosis and embolism: Secondary | ICD-10-CM | POA: Insufficient documentation

## 2012-11-21 DIAGNOSIS — Z7901 Long term (current) use of anticoagulants: Secondary | ICD-10-CM | POA: Insufficient documentation

## 2012-11-21 DIAGNOSIS — E1169 Type 2 diabetes mellitus with other specified complication: Secondary | ICD-10-CM | POA: Insufficient documentation

## 2012-11-21 DIAGNOSIS — I10 Essential (primary) hypertension: Secondary | ICD-10-CM | POA: Insufficient documentation

## 2012-11-21 DIAGNOSIS — R4189 Other symptoms and signs involving cognitive functions and awareness: Secondary | ICD-10-CM | POA: Insufficient documentation

## 2012-11-21 DIAGNOSIS — L97509 Non-pressure chronic ulcer of other part of unspecified foot with unspecified severity: Secondary | ICD-10-CM | POA: Insufficient documentation

## 2012-11-21 LAB — GLUCOSE, CAPILLARY: Glucose-Capillary: 113 mg/dL — ABNORMAL HIGH (ref 70–99)

## 2012-11-22 NOTE — H&P (Signed)
NAME:  Ryan [name], Ryan [name] NO.:  [id number]  MEDICAL RECORD NO.:  [id number]  LOCATION:  FOOT                         FACILITY:  [facility]  PHYSICIAN:  Elesa Hacker, M.D.        DATE OF BIRTH:  1946-07-15  DATE OF ADMISSION:  11/21/2012 DATE OF DISCHARGE:                             HISTORY & PHYSICAL   CHIEF COMPLAINT:  Wounds of right foot.  HISTORY OF PRESENT ILLNESS:  This is a 67 year old diabetic, a very poor informant, who evidently had surgery on his great toe in December of 2013.  He developed a wound infection and evidently DVT and has been removed from the toe, but there was a sore at the surgical site and also on the other side of the foot.  He does not know how this has been treated.  PAST MEDICAL HISTORY:  Significant for: 1. Diabetes mellitus. 2. Cerebrovascular disease. 3. Heart failure. 4. Endocarditis. 5. Arrhythmias. 6. Hypertension. 7. Above-mentioned venous thrombosis.  PAST SURGICAL HISTORY:  He had toe straightening.  SOCIAL HISTORY:  Cigarettes, he denies.  Alcohol, none for quite a while.  ALLERGIES:  None.  MEDICATIONS:  Voltaren, Coumadin, metoprolol, colchicine, Lipitor, Norvasc, docusate, amiodarone, tramadol, and oxycodone.  REVIEW OF SYSTEMS:  Difficult to obtain, but definite essentially as above.  PHYSICAL EXAMINATION:  VITAL SIGNS:  Temperature 97.7, pulse 82 and regular, respirations 16, blood pressure 112/74.  Glucose is 113. HEAD, EYES, EARS, NOSE, THROAT:  Essentially normal. CHEST:  Clear. HEART:  Regular rhythm. ABDOMEN:  Not examined. EXTREMITIES:  Examination of the right foot reveals no pulses.  The foot is hairless and somewhat cool.  There is sensory loss distally.  He has complete movement.  On the medial aspect of the foot, there is a 3.0 x 2.0 x 0.2 wound, which is full-thickness eschar, and after debridement includes some necrotic tendon.  On lateral foot, there is a much smaller 0.6 x 1 wound, which is not  tender.  IMPRESSION:  Diabetic foot ulcer and the patient is status post surgical wound infection.  He also appears to have some cognitive disability.  PLAN OF TREATMENT:  Start with Santyl and Hydrogel.  We will get vascular studies and x-rays of the foot.  I will see him in 7 days.     Elesa Hacker, M.D.     RA/MEDQ  D:  11/21/2012  T:  11/22/2012  Job:  [id number]

## 2012-11-27 ENCOUNTER — Encounter: Payer: Self-pay | Admitting: Surgery

## 2012-11-27 ENCOUNTER — Ambulatory Visit (INDEPENDENT_AMBULATORY_CARE_PROVIDER_SITE_OTHER): Payer: Medicare PPO | Admitting: Surgery

## 2012-11-27 VITALS — BP 120/76 | HR 76 | Resp 16 | Ht 72.0 in | Wt 167.0 lb

## 2012-11-27 DIAGNOSIS — I739 Peripheral vascular disease, unspecified: Secondary | ICD-10-CM

## 2012-11-27 DIAGNOSIS — L98499 Non-pressure chronic ulcer of skin of other sites with unspecified severity: Secondary | ICD-10-CM

## 2012-11-27 NOTE — Progress Notes (Signed)
Vascular and Vein Specialist of [facility]   Patient name: Ryan [name] MRN: [id number] DOB: [birth year] Sex: male     Chief Complaint  Patient presents with  . PVD    Right great toe 3 wk f/up.    HISTORY OF PRESENT ILLNESS: The patient is back today for followup. He has a wound on his right foot where orthopedic hardware was removed. He has known severe peripheral vascular disease. I have been trying to manage his wound conservatively because he has recently undergone valve replacement for endocarditis. Last time I saw him I referred him to the wound center. Today is his last day at a nursing facility before he goes home. He reports no pain in the foot. He has no fevers or chills. He has been performing dressing changes to her the wound center recommendations  Past Medical History  Diagnosis Date  . Hypertension     Past Surgical History  Procedure Laterality Date  . I&d extremity  09/14/2012    Procedure: IRRIGATION AND DEBRIDEMENT EXTREMITY;  Surgeon: Tennis Must, MD;  Location: [place];  Service: Orthopedics;  Laterality: Right;  . Extremity wire/pin removal  09/14/2012    Procedure: REMOVAL K-WIRE/PIN EXTREMITY;  Surgeon: [name], MD;  Location: [place];  Service: Orthopedics;  Laterality: Right;  Right Foot  . Tee without cardioversion  09/18/2012    Procedure: TRANSESOPHAGEAL ECHOCARDIOGRAM (TEE);  Surgeon: [name], MD;  Location[facility] ENDOSCOPY;  Service: Cardiovascular;  Laterality: N/A;  . Mitral valve replacement  09/26/2012    Procedure: MITRAL VALVE (MV) REPLACEMENT;  Surgeon: [name], MD;  Location: [place];  Service: Open Heart Surgery;  Laterality: N/A;  . Intraoperative transesophageal echocardiogram  09/26/2012    Procedure: INTRAOPERATIVE TRANSESOPHAGEAL ECHOCARDIOGRAM;  Surgeon: [name], MD;  Location: [facility] OR;  Service: Open Heart Surgery;  Laterality: N/A;    History   Social History  . Marital Status: Married    Spouse Name: N/A    Number of  Children: N/A  . Years of Education: N/A   Occupational History  . Not on file.   Social History Main Topics  . Smoking status: Never Smoker   . Smokeless tobacco: Never Used  . Alcohol Use: No  . Drug Use: No  . Sexually Active: Not on file   Other Topics Concern  . Not on file   Social History Narrative  . No narrative on file    Family History  Problem Relation Age of Onset  . Hypertension Mother   . Hypertension Father     Allergies as of 11/27/2012  . (No Known Allergies)    Current Outpatient Prescriptions on File Prior to Visit  Medication Sig Dispense Refill  . amiodarone (PACERONE) 200 MG tablet Take 1 tablet (200 mg total) by mouth daily.  30 tablet  4  . amLODipine (NORVASC) 2.5 MG tablet Take 1 tablet (2.5 mg total) by mouth daily.  30 tablet  5  . atorvastatin (LIPITOR) 40 MG tablet Take 1 tablet (40 mg total) by mouth daily at 6 PM.  30 tablet  5  . bisacodyl (DULCOLAX) 5 MG EC tablet Take 2 tablets (10 mg total) by mouth daily as needed (Give daily if no BM).  60 tablet  5  . ceFAZolin (ANCEF) 2-3 GM-% SOLR Inject 50 mLs (2 g total) into the vein every 8 (eight) hours. Through Nov 07, 2012  100 each  0  . colchicine 0.6 MG tablet Take 1  tablet (0.6 mg total) by mouth 2 (two) times daily.  60 tablet  5  . docusate sodium 100 MG CAPS Take 200 mg by mouth daily.  10 capsule  5  . feeding supplement (ENSURE COMPLETE) LIQD Take 237 mLs by mouth 2 (two) times daily between meals.  60 Bottle  0  . feeding supplement (PRO-STAT SUGAR FREE 64) LIQD Take 30 mLs by mouth 2 (two) times daily.  900 mL  0  . iron polysaccharides (NIFEREX) 150 MG capsule Take 1 capsule (150 mg total) by mouth daily.  30 capsule  0  . metoprolol tartrate (LOPRESSOR) 12.5 mg TABS Take 0.5 tablets (12.5 mg total) by mouth 2 (two) times daily.  30 tablet  0  . oxyCODONE (OXY IR/ROXICODONE) 5 MG immediate release tablet Take 1-2 tablets (5-10 mg total) by mouth every 3 (three) hours as needed.   50 tablet  0  . pantoprazole (PROTONIX) 40 MG tablet Take 1 tablet (40 mg total) by mouth daily before breakfast.  30 tablet  0  . potassium chloride SA (K-DUR,KLOR-CON) 20 MEQ tablet Take 2 tablets (40 mEq total) by mouth daily.  30 tablet  0  . traMADol (ULTRAM) 50 MG tablet Take 1 tablet (50 mg total) by mouth every 4 (four) hours as needed.  30 tablet  0  . warfarin (COUMADIN) 2.5 MG tablet Take 1 tablet (2.5 mg total) by mouth one time only at 6 PM.  30 tablet  0   No current facility-administered medications on file prior to visit.     REVIEW OF SYSTEMS: No changes from prior visit PHYSICAL EXAMINATION:   Vital signs are BP 120/76  Pulse 76  Resp 16  Ht 6' (1.829 m)  Wt 167 lb (75.751 kg)  BMI 22.64 kg/m2  SpO2 99% General: The patient appears their stated age. HEENT:  No gross abnormalities Pulmonary:  Non labored breathing Musculoskeletal: There are no major deformities. Neurologic: No focal weakness or paresthesias are detected, Skin: 3 x 2 cm open ulcer on the dorsum of the right foot. A few patchy areas of red granulation tissue is present, however the majority of the wound has a yellowish fibrinous material. There is no surrounding erythema or signs of infection Psychiatric: The patient has normal affect. Cardiovascular: Pedal pulse on the right is not palpable   Diagnostic Studies None   Assessment: Right foot ulcer Plan: I do not feel that there has been significant change within the patient's wound. I have been delaying any form of revascularization because of the patient's recent hospital course and generalized deconditioning. Mother likely, he will require improvement in his blood flow to heal this wound, however I am still somewhat hopeful that he can heal this without revascularization. I have scheduled him to come back in 3 weeks for repeat evaluation. There has not been any signs of improvement I will consider revascularization. I had to re-reviewed his  angiogram. I would consider an attempt at percutaneous intervention, however I will need to further discuss this with the patient given the fact that there is a good chance I could not cross his occlusion.  Eldridge Abrahams, M.D. [facility] Vein Specialists of [place] Office: [phone] Pager:  [phone]

## 2012-12-15 ENCOUNTER — Encounter: Payer: Self-pay | Admitting: Surgery

## 2012-12-18 ENCOUNTER — Encounter: Payer: Self-pay | Admitting: Surgery

## 2012-12-18 ENCOUNTER — Ambulatory Visit (INDEPENDENT_AMBULATORY_CARE_PROVIDER_SITE_OTHER): Payer: Medicare PPO | Admitting: Surgery

## 2012-12-18 VITALS — Ht 72.0 in | Wt 175.9 lb

## 2012-12-18 DIAGNOSIS — L98499 Non-pressure chronic ulcer of skin of other sites with unspecified severity: Secondary | ICD-10-CM

## 2012-12-18 DIAGNOSIS — I739 Peripheral vascular disease, unspecified: Secondary | ICD-10-CM

## 2012-12-18 NOTE — Progress Notes (Signed)
Vascular and Vein Specialist of [facility]   Patient name: Ryan [name] MRN: [id number] DOB: 1945/10/20 Sex: male     Chief Complaint  Patient presents with  . Re-evaluation    3 wk f/u wound check    HISTORY OF PRESENT ILLNESS: The patient is back today for followup. He has a wound on his right foot where orthopedic hardware was removed. He has known severe peripheral vascular disease. I have been trying to manage his wound conservatively because he has recently undergone valve replacement for endocarditis.Marland Kitchen He is been treated at the wound center. He reports no pain in the foot. He has no fevers or chills. He has been performing dressing changes based on the wound center recommendations.   Past Medical History  Diagnosis Date  . Hypertension     Past Surgical History  Procedure Laterality Date  . I&d extremity  09/14/2012    Procedure: IRRIGATION AND DEBRIDEMENT EXTREMITY;  Surgeon: Tennis Must, MD;  Location: [place];  Service: Orthopedics;  Laterality: Right;  . Extremity wire/pin removal  09/14/2012    Procedure: REMOVAL K-WIRE/PIN EXTREMITY;  Surgeon: [name], MD;  Location: [place];  Service: Orthopedics;  Laterality: Right;  Right Foot  . Tee without cardioversion  09/18/2012    Procedure: TRANSESOPHAGEAL ECHOCARDIOGRAM (TEE);  Surgeon: [name], MD;  Location: [facility] ENDOSCOPY;  Service: Cardiovascular;  Laterality: N/A;  . Mitral valve replacement  09/26/2012    Procedure: MITRAL VALVE (MV) REPLACEMENT;  Surgeon: [name], MD;  Location: [place];  Service: Open Heart Surgery;  Laterality: N/A;  . Intraoperative transesophageal echocardiogram  09/26/2012    Procedure: INTRAOPERATIVE TRANSESOPHAGEAL ECHOCARDIOGRAM;  Surgeon: [name], MD;  Location: [facility] OR;  Service: Open Heart Surgery;  Laterality: N/A;    History   Social History  . Marital Status: Married    Spouse Name: N/A    Number of Children: N/A  . Years of Education: N/A   Occupational History  .  Not on file.   Social History Main Topics  . Smoking status: Never Smoker   . Smokeless tobacco: Never Used  . Alcohol Use: No  . Drug Use: No  . Sexually Active: Not on file   Other Topics Concern  . Not on file   Social History Narrative  . No narrative on file    Family History  Problem Relation Age of Onset  . Hypertension Mother   . Hypertension Father     Allergies as of 12/18/2012  . (No Known Allergies)    Current Outpatient Prescriptions on File Prior to Visit  Medication Sig Dispense Refill  . amLODipine (NORVASC) 2.5 MG tablet Take 1 tablet (2.5 mg total) by mouth daily.  30 tablet  5  . atorvastatin (LIPITOR) 40 MG tablet Take 1 tablet (40 mg total) by mouth daily at 6 PM.  30 tablet  5  . bisacodyl (DULCOLAX) 5 MG EC tablet Take 2 tablets (10 mg total) by mouth daily as needed (Give daily if no BM).  60 tablet  5  . ceFAZolin (ANCEF) 2-3 GM-% SOLR Inject 50 mLs (2 g total) into the vein every 8 (eight) hours. Through Nov 07, 2012  100 each  0  . colchicine 0.6 MG tablet Take 1 tablet (0.6 mg total) by mouth 2 (two) times daily.  60 tablet  5  . DIOVAN 80 MG tablet       . docusate sodium 100 MG CAPS Take 200 mg by mouth  daily.  10 capsule  5  . feeding supplement (ENSURE COMPLETE) LIQD Take 237 mLs by mouth 2 (two) times daily between meals.  60 Bottle  0  . feeding supplement (PRO-STAT SUGAR FREE 64) LIQD Take 30 mLs by mouth 2 (two) times daily.  900 mL  0  . iron polysaccharides (NIFEREX) 150 MG capsule Take 1 capsule (150 mg total) by mouth daily.  30 capsule  0  . metoprolol tartrate (LOPRESSOR) 12.5 mg TABS Take 0.5 tablets (12.5 mg total) by mouth 2 (two) times daily.  30 tablet  0  . nabumetone (RELAFEN) 750 MG tablet       . oxyCODONE (OXY IR/ROXICODONE) 5 MG immediate release tablet Take 1-2 tablets (5-10 mg total) by mouth every 3 (three) hours as needed.  50 tablet  0  . oxyCODONE-acetaminophen (PERCOCET/ROXICET) 5-325 MG per tablet       .  pantoprazole (PROTONIX) 40 MG tablet Take 1 tablet (40 mg total) by mouth daily before breakfast.  30 tablet  0  . potassium chloride SA (K-DUR,KLOR-CON) 20 MEQ tablet Take 2 tablets (40 mEq total) by mouth daily.  30 tablet  0  . traMADol (ULTRAM) 50 MG tablet Take 1 tablet (50 mg total) by mouth every 4 (four) hours as needed.  30 tablet  0  . warfarin (COUMADIN) 2.5 MG tablet Take 1 tablet (2.5 mg total) by mouth one time only at 6 PM.  30 tablet  0  . amiodarone (PACERONE) 200 MG tablet Take 1 tablet (200 mg total) by mouth daily.  30 tablet  4   No current facility-administered medications on file prior to visit.     REVIEW OF SYSTEMS: No changes from prior was  PHYSICAL EXAMINATION:   Vital signs are BP   Ht 6' (1.829 m)  Wt 175 lb 14.4 oz (79.788 kg)  BMI 23.85 kg/m2  SpO2 100% General: The patient appears their stated age. HEENT:  No gross abnormalities Pulmonary:  Non labored breathing Musculoskeletal: There are no major deformities. Neurologic: No focal weakness or paresthesias are detected, Skin: Decrease in ulcer size from 3 x 2 to 2.5 x 1.75 Psychiatric: The patient has normal affect. Cardiovascular: Pedal pulses are nonpalpable   Diagnostic Studies None  Assessment: Right foot ulcer Plan: Over the course of the past 3 weeks, the patient has had a decrease in the size of his ulcer. The wound also looks more superficial to me with healthy granulation tissue at its base. I do not think that he needs revascularization at this time. We'll continue to treat him with wound care in hopes of having his ulcer completely healed. I will see him back in one month for wound check.  Eldridge Abrahams, M.D. Vascular and Vein Specialists of [place] Office: [phone] Pager:  [phone]

## 2012-12-19 ENCOUNTER — Encounter (HOSPITAL_BASED_OUTPATIENT_CLINIC_OR_DEPARTMENT_OTHER): Payer: Medicare PPO | Attending: General Surgery

## 2012-12-19 DIAGNOSIS — I798 Other disorders of arteries, arterioles and capillaries in diseases classified elsewhere: Secondary | ICD-10-CM | POA: Insufficient documentation

## 2012-12-19 DIAGNOSIS — L97509 Non-pressure chronic ulcer of other part of unspecified foot with unspecified severity: Secondary | ICD-10-CM | POA: Insufficient documentation

## 2012-12-19 DIAGNOSIS — E1169 Type 2 diabetes mellitus with other specified complication: Secondary | ICD-10-CM | POA: Insufficient documentation

## 2012-12-19 DIAGNOSIS — E1159 Type 2 diabetes mellitus with other circulatory complications: Secondary | ICD-10-CM | POA: Insufficient documentation

## 2012-12-19 LAB — GLUCOSE, CAPILLARY: Glucose-Capillary: 74 mg/dL (ref 70–99)

## 2013-01-23 ENCOUNTER — Encounter (HOSPITAL_BASED_OUTPATIENT_CLINIC_OR_DEPARTMENT_OTHER): Payer: Medicare PPO | Attending: General Surgery

## 2013-01-23 DIAGNOSIS — E1169 Type 2 diabetes mellitus with other specified complication: Secondary | ICD-10-CM | POA: Insufficient documentation

## 2013-01-23 DIAGNOSIS — L97509 Non-pressure chronic ulcer of other part of unspecified foot with unspecified severity: Secondary | ICD-10-CM | POA: Insufficient documentation

## 2013-01-23 LAB — GLUCOSE, CAPILLARY: Glucose-Capillary: 73 mg/dL (ref 70–99)

## 2013-01-26 ENCOUNTER — Encounter: Payer: Self-pay | Admitting: Surgery

## 2013-01-29 ENCOUNTER — Ambulatory Visit (INDEPENDENT_AMBULATORY_CARE_PROVIDER_SITE_OTHER): Payer: Medicare PPO | Admitting: Surgery

## 2013-01-29 ENCOUNTER — Encounter: Payer: Self-pay | Admitting: Surgery

## 2013-01-29 VITALS — BP 121/72 | HR 91 | Resp 16 | Ht 74.0 in | Wt 188.0 lb

## 2013-01-29 DIAGNOSIS — M79609 Pain in unspecified limb: Secondary | ICD-10-CM | POA: Insufficient documentation

## 2013-01-29 DIAGNOSIS — I739 Peripheral vascular disease, unspecified: Secondary | ICD-10-CM

## 2013-01-29 NOTE — Addendum Note (Signed)
Addended by: Mena Goes on: 01/29/2013 03:02 PM   Modules accepted: Orders

## 2013-01-29 NOTE — Progress Notes (Signed)
Vascular and Vein Specialist of [facility]   Patient name: Ryan [name] MRN: [id number] DOB: 1946-01-02 Sex: male     Chief Complaint  Patient presents with  . PVD    one month f/up right  Great toe healing slow.  C/O Right thight and knee painful off/on 4 month.    HISTORY OF PRESENT ILLNESS: The patient is back today for followup. I met the patient in the hospital. He was originally transferred for possible right leg ischemia. 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 He also had infected hardware removed from his right foot. He has developed a wound on his right foot. He underwent angiography which revealed an occluded right popliteal artery. We have been managing his wound nonoperatively. He is visiting the wound center frequently. I have noticed a significant improvement in the wound. He denies fevers or chills. He does complain of some pain in his right thigh. He is able to ambulate without difficulty although he does use a cane around the house occasionally.  Past Medical History  Diagnosis Date  . Hypertension     Past Surgical History  Procedure Laterality Date  . I&d extremity  09/14/2012    Procedure: IRRIGATION AND DEBRIDEMENT EXTREMITY;  Surgeon: Tennis Must, MD;  Location: [place];  Service: Orthopedics;  Laterality: Right;  . Extremity wire/pin removal  09/14/2012    Procedure: REMOVAL K-WIRE/PIN EXTREMITY;  Surgeon: [name], MD;  Location: [place];  Service: Orthopedics;  Laterality: Right;  Right Foot  . Tee without cardioversion  09/18/2012    Procedure: TRANSESOPHAGEAL ECHOCARDIOGRAM (TEE);  Surgeon: [name], MD;  Location: [facility] ENDOSCOPY;  Service: Cardiovascular;  Laterality: N/A;  . Mitral valve replacement  09/26/2012    Procedure: MITRAL VALVE (MV) REPLACEMENT;  Surgeon: [name], MD;  Location: [place];  Service: Open Heart Surgery;  Laterality: N/A;  . Intraoperative transesophageal echocardiogram  09/26/2012    Procedure: INTRAOPERATIVE TRANSESOPHAGEAL ECHOCARDIOGRAM;  Surgeon: [name], MD;  Location: [facility] OR;  Service: Open Heart Surgery;  Laterality: N/A;    History   Social History  . Marital Status: Married    Spouse Name: N/A    Number of Children: N/A  . Years of Education: N/A   Occupational History  . Not on file.   Social History Main Topics  . Smoking status: Never Smoker   . Smokeless tobacco: Never Used  . Alcohol Use: No  . Drug Use: No  . Sexually Active: Not on file   Other Topics Concern  . Not on file   Social History Narrative  . No narrative on file    Family History  Problem Relation Age of Onset  . Hypertension Mother   . Hypertension Father     Allergies as of 01/29/2013  . (No Known Allergies)    Current Outpatient Prescriptions on File Prior to Visit  Medication Sig Dispense Refill  . amLODipine (NORVASC) 2.5 MG tablet Take 1 tablet (2.5 mg total) by mouth daily.  30 tablet  5  . atorvastatin (LIPITOR) 40 MG tablet Take 1 tablet (40 mg total) by mouth daily at 6 PM.  30 tablet  5  . bisacodyl (DULCOLAX) 5 MG EC tablet Take 2 tablets (10 mg total) by mouth daily as needed (Give daily if no BM).  60 tablet  5  . ceFAZolin (ANCEF) 2-3 GM-% SOLR Inject 50 mLs (2 g total) into the vein every 8 (eight) hours. Through Nov 07, 2012  100 each  0  . colchicine 0.6 MG tablet Take 1 tablet (0.6 mg total) by mouth 2 (two) times daily.  60 tablet  5  . DIOVAN 80 MG tablet       . docusate sodium 100 MG CAPS Take 200 mg by mouth daily.  10 capsule  5  . feeding supplement (ENSURE COMPLETE) LIQD Take 237 mLs by mouth 2 (two) times daily between meals.  60 Bottle  0  . feeding supplement (PRO-STAT SUGAR FREE 64) LIQD Take 30 mLs by mouth 2 (two) times daily.  900 mL  0  . iron polysaccharides (NIFEREX) 150 MG capsule Take 1 capsule (150 mg total) by mouth daily.  30 capsule  0  . metoprolol tartrate (LOPRESSOR) 12.5 mg TABS Take 0.5 tablets (12.5 mg total) by  mouth 2 (two) times daily.  30 tablet  0  . nabumetone (RELAFEN) 750 MG tablet       . oxyCODONE-acetaminophen (PERCOCET/ROXICET) 5-325 MG per tablet       . pantoprazole (PROTONIX) 40 MG tablet Take 1 tablet (40 mg total) by mouth daily before breakfast.  30 tablet  0  . potassium chloride SA (K-DUR,KLOR-CON) 20 MEQ tablet Take 2 tablets (40 mEq total) by mouth daily.  30 tablet  0  . SANTYL ointment Apply 1 application topically daily.      . traMADol (ULTRAM) 50 MG tablet Take 1 tablet (50 mg total) by mouth every 4 (four) hours as needed.  30 tablet  0  . warfarin (COUMADIN) 2.5 MG tablet Take 1 tablet (2.5 mg total) by mouth one time only at 6 PM.  30 tablet  0  . amiodarone (PACERONE) 200 MG tablet Take 1 tablet (200 mg total) by mouth daily.  30 tablet  4  . oxyCODONE (OXY IR/ROXICODONE) 5 MG immediate release tablet Take 1-2 tablets (5-10 mg total) by mouth every 3 (three) hours as needed.  50 tablet  0   No current facility-administered medications on file prior to visit.     REVIEW OF SYSTEMS: No changes since prior visit  PHYSICAL EXAMINATION:   Vital signs are BP 121/72  Pulse 91  Resp 16  Ht 6\' 2"  (1.88 m)  Wt 188 lb (85.276 kg)  BMI 24.13 kg/m2  SpO2 99% General: The patient appears their stated age. HEENT:  No gross abnormalities Pulmonary:  Non labored breathing Musculoskeletal: There are no major deformities. Neurologic: No focal weakness or paresthesias are detected, Skin: The wound on the right foot has nearly completely healed. There is residual subcentimeter opening that is very superficial without evidence of infection Psychiatric: The patient has normal affect. Cardiovascular: Nonpalpable pedal pulses bilaterally   Diagnostic Studies None  Assessment: Right lower extremity wound Plan: The patient has made dramatic progress and Silastic arm. The wound is nearly completely healed. Today, we discussed the signs and symptoms of claudication. I've asked  that he pay attention to what limits his walking ability. I scheduled him to come back in 3 months. At that time I will repeat his ABIs and duplex of the right leg  V. Leia Alf, M.D. Vascular and Vein Specialists of [place] Office: [phone] Pager:  [phone]

## 2013-01-30 LAB — GLUCOSE, CAPILLARY: Glucose-Capillary: 86 mg/dL (ref 70–99)

## 2013-03-30 ENCOUNTER — Encounter: Payer: Self-pay | Admitting: Surgery

## 2013-04-02 ENCOUNTER — Encounter (INDEPENDENT_AMBULATORY_CARE_PROVIDER_SITE_OTHER): Payer: Medicare PPO | Admitting: *Deleted

## 2013-04-02 ENCOUNTER — Ambulatory Visit (INDEPENDENT_AMBULATORY_CARE_PROVIDER_SITE_OTHER): Payer: Medicare PPO | Admitting: Surgery

## 2013-04-02 ENCOUNTER — Encounter: Payer: Self-pay | Admitting: Surgery

## 2013-04-02 VITALS — BP 132/74 | HR 73 | Ht 74.0 in | Wt 185.2 lb

## 2013-04-02 DIAGNOSIS — M79609 Pain in unspecified limb: Secondary | ICD-10-CM

## 2013-04-02 DIAGNOSIS — I739 Peripheral vascular disease, unspecified: Secondary | ICD-10-CM

## 2013-04-02 NOTE — Progress Notes (Signed)
Vascular and Vein Specialist of [facility]   Patient name: Ryan [name] MRN: [id number] DOB: [birth year] Sex: male     Chief Complaint  Patient presents with  . Re-evaluation    2 month f/u  pt c/o rt leg/knee pain    HISTORY OF PRESENT ILLNESS: The patient is back today for followup. I met the patient in the hospital. He was originally transferred for possible right leg ischemia. 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 He also had infected hardware removed from his right foot. He has developed a wound on his right foot. He underwent angiography which revealed an occluded right popliteal artery. We have been managing his wound nonoperatively. He is visiting the wound center frequently. He is able to ambulate without difficulty although he does use a cane around the house occasionally. He has been discharged from the wound center.   Past Medical History  Diagnosis Date  . Hypertension     Past Surgical History  Procedure Laterality Date  . I&d extremity  09/14/2012    Procedure: IRRIGATION AND DEBRIDEMENT EXTREMITY;  Surgeon: Tennis Must, MD;  Location: [place];  Service: Orthopedics;  Laterality: Right;  . Extremity wire/pin removal  09/14/2012    Procedure: REMOVAL K-WIRE/PIN EXTREMITY;  Surgeon: [name], MD;  Location: [place];  Service: Orthopedics;  Laterality: Right;  Right Foot  . Tee without cardioversion  09/18/2012    Procedure: TRANSESOPHAGEAL ECHOCARDIOGRAM (TEE);  Surgeon: [name], MD;  Location: [facility] ENDOSCOPY;  Service: Cardiovascular;  Laterality: N/A;  . Mitral valve replacement  09/26/2012    Procedure: MITRAL VALVE (MV) REPLACEMENT;  Surgeon: [name], MD;  Location: [place];  Service: Open Heart Surgery;  Laterality: N/A;  . Intraoperative transesophageal echocardiogram  09/26/2012    Procedure: INTRAOPERATIVE TRANSESOPHAGEAL ECHOCARDIOGRAM;  Surgeon: [name], MD;  Location: [facility] OR;  Service: Open Heart  Surgery;  Laterality: N/A;    History   Social History  . Marital Status: Married    Spouse Name: N/A    Number of Children: N/A  . Years of Education: N/A   Occupational History  . Not on file.   Social History Main Topics  . Smoking status: Never Smoker   . Smokeless tobacco: Never Used  . Alcohol Use: No  . Drug Use: No  . Sexually Active: Not on file   Other Topics Concern  . Not on file   Social History Narrative  . No narrative on file    Family History  Problem Relation Age of Onset  . Hypertension Mother   . Hypertension Father     Allergies as of 04/02/2013  . (No Known Allergies)    Current Outpatient Prescriptions on File Prior to Visit  Medication Sig Dispense Refill  . amiodarone (PACERONE) 200 MG tablet Take 1 tablet (200 mg total) by mouth daily.  30 tablet  4  . amLODipine (NORVASC) 2.5 MG tablet Take 1 tablet (2.5 mg total) by mouth daily.  30 tablet  5  . atorvastatin (LIPITOR) 40 MG tablet Take 1 tablet (40 mg total) by mouth daily at 6 PM.  30 tablet  5  . bisacodyl (DULCOLAX) 5 MG EC tablet Take 2 tablets (10 mg total) by mouth daily as needed (Give daily if no BM).  60 tablet  5  . ceFAZolin (ANCEF) 2-3 GM-% SOLR Inject 50 mLs (2 g total) into the vein every 8 (eight) hours. Through Nov 07, 2012  100 each  0  . colchicine 0.6 MG tablet Take 1 tablet (0.6 mg total) by mouth 2 (two) times daily.  60 tablet  5  . DIOVAN 80 MG tablet       . docusate sodium 100 MG CAPS Take 200 mg by mouth daily.  10 capsule  5  . feeding supplement (ENSURE COMPLETE) LIQD Take 237 mLs by mouth 2 (two) times daily between meals.  60 Bottle  0  . feeding supplement (PRO-STAT SUGAR FREE 64) LIQD Take 30 mLs by mouth 2 (two) times daily.  900 mL  0  . iron polysaccharides (NIFEREX) 150 MG capsule Take 1 capsule (150 mg total) by mouth daily.  30 capsule  0  . metoprolol tartrate (LOPRESSOR) 12.5 mg TABS Take 0.5 tablets (12.5 mg total) by mouth 2 (two) times daily.  30  tablet  0  . nabumetone (RELAFEN) 750 MG tablet       . oxyCODONE (OXY IR/ROXICODONE) 5 MG immediate release tablet Take 1-2 tablets (5-10 mg total) by mouth every 3 (three) hours as needed.  50 tablet  0  . oxyCODONE-acetaminophen (PERCOCET/ROXICET) 5-325 MG per tablet       . pantoprazole (PROTONIX) 40 MG tablet Take 1 tablet (40 mg total) by mouth daily before breakfast.  30 tablet  0  . potassium chloride SA (K-DUR,KLOR-CON) 20 MEQ tablet Take 2 tablets (40 mEq total) by mouth daily.  30 tablet  0  . SANTYL ointment Apply 1 application topically daily.      . traMADol (ULTRAM) 50 MG tablet Take 1 tablet (50 mg total) by mouth every 4 (four) hours as needed.  30 tablet  0  . warfarin (COUMADIN) 2.5 MG tablet Take 1 tablet (2.5 mg total) by mouth one time only at 6 PM.  30 tablet  0   No current facility-administered medications on file prior to visit.     REVIEW OF SYSTEMS: No changes from prior visit  PHYSICAL EXAMINATION:   Vital signs are BP 132/74  Pulse 73  Ht 6\' 2"  (1.88 m)  Wt 185 lb 3.2 oz (84.006 kg)  BMI 23.77 kg/m2  SpO2 100% General: The patient appears their stated age. HEENT:  No gross abnormalities Pulmonary:  Non labored breathing Musculoskeletal: There are no major deformities. Neurologic: No focal weakness or paresthesias are detected, Skin: The wound on the left foot is completely healed Psychiatric: The patient has normal affect. Cardiovascular: Pedal pulses are not palpable on the right.   Diagnostic Studies Duplex ultrasound was ordered and reviewed. He is occlusion of the popliteal artery on the right. ABI 0.69. On the left he has triphasic pedal wave forms and an ABI of 1.0.  Assessment: Peripheral vascular disease with ulceration, right leg Plan:  The patient's right foot wound has completely healed. He does not have symptoms of claudication. I have educated him on what to watch out for regarding symptoms. If he develops a new wound for an opening  of the previous wound he will contact me, otherwise I will see him back in the office in one year with repeat ABIs. He did have a carotid duplex prior to his open-heart surgery. He has no significant extracranial carotid disease.  Eldridge Abrahams, M.D. [facility] Vein Specialists of [place] Office: [phone] Pager:  [phone]

## 2013-04-03 ENCOUNTER — Other Ambulatory Visit: Payer: Self-pay | Admitting: *Deleted

## 2013-04-03 DIAGNOSIS — I739 Peripheral vascular disease, unspecified: Secondary | ICD-10-CM

## 2013-08-02 ENCOUNTER — Encounter: Payer: Self-pay | Admitting: Interventional Cardiology

## 2013-11-09 ENCOUNTER — Ambulatory Visit: Payer: Medicare PPO | Admitting: Interventional Cardiology

## 2013-11-21 ENCOUNTER — Encounter: Payer: Self-pay | Admitting: Interventional Cardiology

## 2013-11-21 ENCOUNTER — Ambulatory Visit (INDEPENDENT_AMBULATORY_CARE_PROVIDER_SITE_OTHER): Payer: Commercial Managed Care - HMO | Admitting: Interventional Cardiology

## 2013-11-21 VITALS — BP 125/69 | HR 77 | Ht 72.0 in | Wt 197.0 lb

## 2013-11-21 DIAGNOSIS — I10 Essential (primary) hypertension: Secondary | ICD-10-CM

## 2013-11-21 DIAGNOSIS — B999 Unspecified infectious disease: Secondary | ICD-10-CM

## 2013-11-21 DIAGNOSIS — I340 Nonrheumatic mitral (valve) insufficiency: Secondary | ICD-10-CM

## 2013-11-21 DIAGNOSIS — I059 Rheumatic mitral valve disease, unspecified: Secondary | ICD-10-CM

## 2013-11-21 DIAGNOSIS — I4891 Unspecified atrial fibrillation: Secondary | ICD-10-CM

## 2013-11-21 DIAGNOSIS — Z7901 Long term (current) use of anticoagulants: Secondary | ICD-10-CM

## 2013-11-21 DIAGNOSIS — I5032 Chronic diastolic (congestive) heart failure: Secondary | ICD-10-CM

## 2013-11-21 NOTE — Progress Notes (Signed)
Patient ID: Ryan [name], male   DOB: 27-Jul-1946, 68 y.o.   MRN: [id number]    [address], [address] [place], [place]  [address] Phone: [phone] Fax:  [phone]  Date:  11/21/2013   ID:  Ryan [name], DOB [birth year], MRN [id number]  PCP:  Ryan Nakai, MD   ASSESSMENT:  1. History of paroxysmal atrial flutter during pericarditis/endocarditis. He has no recent history of palpitations. 2. 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 Chronic diastolic heart failure, resolved symptoms 3. Chronic Coumadin therapy  PLAN:  1. 48 hour Holter monitor. If no evidence of atrial fib flutter we will contemplate discontinuing anticoagulation therapy. 2. A Holter will be used as a basis to consider discontinuation of anticoagulation if no atrial fib flutter is identified 3. Clinical followup in 6 month with an EKG   SUBJECTIVE: Ryan [name] is a 68 y.o. male who is doing well. 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 He a large mitral valve vegetation. He never required to mitral valve surgery. He denies dyspnea exertional fatigue. He has not had palpitations. He has been off amiodarone now for greater than 6 months. He denies bleeding on Coumadin. His INR is followed by his primary physician Dr. Truman [name].   Wt Readings from Last 3 Encounters:  11/21/13 197 lb (89.359 kg)  04/02/13 185 lb 3.2 oz (84.006 kg)  01/29/13 188 lb (85.276 kg)     Past Medical History  Diagnosis Date  . Hypertension     Current Outpatient Prescriptions  Medication Sig Dispense Refill  . amLODipine (NORVASC) 2.5 MG tablet Take 1 tablet (2.5 mg total) by mouth daily.  30 tablet  5  . atorvastatin (LIPITOR) 40 MG tablet Take 1 tablet (40 mg total) by mouth daily at 6 PM.  30 tablet  5  . cyclobenzaprine (FLEXERIL) 10 MG tablet Take 10 mg by  mouth 3 (three) times daily as needed for muscle spasms.      Marland Kitchen DIOVAN 80 MG tablet       . HYDROcodone-acetaminophen (NORCO/VICODIN) 5-325 MG per tablet Take 1 tablet by mouth every 6 (six) hours as needed for moderate pain.      . metoprolol tartrate (LOPRESSOR) 25 MG tablet Take 1/2 tab twice a day      . potassium chloride SA (K-DUR,KLOR-CON) 20 MEQ tablet Take 2 tablets (40 mEq total) by mouth daily.  30 tablet  0  . warfarin (COUMADIN) 2.5 MG tablet Take 1 tablet (2.5 mg total) by mouth one time only at 6 PM.  30 tablet  0  . bisacodyl (DULCOLAX) 5 MG EC tablet Take 2 tablets (10 mg total) by mouth daily as needed (Give daily if no BM).  60 tablet  5  . docusate sodium 100 MG CAPS Take 200 mg by mouth daily.  10 capsule  5   No current facility-administered medications for this visit.    Allergies:   No Known Allergies  Social History:  The patient  reports that he has never smoked. He has never used smokeless tobacco. He reports that he does not drink alcohol or use illicit drugs.   ROS:  Please see the history of present illness.   Appetite is good. Denies chills and fever.   All other systems reviewed and negative.   OBJECTIVE: VS:  BP 125/69  Pulse 77  Ht 6' (1.829 m)  [facility]  197 lb (89.359 kg)  BMI 26.71 kg/m2 Well nourished, well developed, in no acute distress, appears than his stated age  44: normal Neck: JVD flat. Carotid bruit no bruits  Cardiac:  normal S1, S2; RRR; no murmur Lungs:  clear to auscultation bilaterally, no wheezing, rhonchi or rales Abd: soft, nontender, no hepatomegaly Ext: Edema absent edema. Pulses 2+ and symmetric Skin: warm and dry Neuro:  CNs 2-12 intact, no focal abnormalities noted  EKG:  Normal sinus rhythm biatrial abnormality otherwise normal EKG       Signed, Illene Labrador III, MD 11/21/2013 1:46 PM

## 2013-11-21 NOTE — Patient Instructions (Signed)
Your physician recommends that you continue on your current medications as directed. Please refer to the Current Medication list given to you today.  Your physician has recommended that you wear a holter monitor. Holter monitors are medical devices that record the heart's electrical activity. Doctors most often use these monitors to diagnose arrhythmias. Arrhythmias are problems with the speed or rhythm of the heartbeat. The monitor is a small, portable device. You can wear one while you do your normal daily activities. This is usually used to diagnose what is causing palpitations/syncope (passing out).  Your physician wants you to follow-up in: 6 months You will receive a reminder letter in the mail two months in advance. If you don't receive a letter, please call our office to schedule the follow-up appointment.

## 2013-11-26 ENCOUNTER — Encounter: Payer: Self-pay | Admitting: *Deleted

## 2013-11-26 ENCOUNTER — Encounter (INDEPENDENT_AMBULATORY_CARE_PROVIDER_SITE_OTHER): Payer: Medicare HMO

## 2013-11-26 DIAGNOSIS — I4891 Unspecified atrial fibrillation: Secondary | ICD-10-CM

## 2013-11-26 NOTE — Progress Notes (Signed)
Patient ID: Ryan [name], male   DOB: 02-19-1946, 69 y.o.   MRN: [id number] E-Cardio 48 hour holter monitor applied to patient.

## 2013-12-14 ENCOUNTER — Telehealth: Payer: Self-pay

## 2013-12-14 NOTE — Telephone Encounter (Signed)
called Ryan [name] given holter monitor results. No afib/aflutter ok to stop coumadin per Dr.Smith.pt verbalized understanding.pt rqst a cpoy of monitor be fwd to his pcp Dr.Lee

## 2014-04-05 ENCOUNTER — Encounter: Payer: Self-pay | Admitting: Family

## 2014-04-08 ENCOUNTER — Encounter: Payer: Self-pay | Admitting: Family

## 2014-04-08 ENCOUNTER — Ambulatory Visit (HOSPITAL_COMMUNITY)
Admission: RE | Admit: 2014-04-08 | Discharge: 2014-04-08 | Disposition: A | Discharge status: Home / Self Care | Payer: Medicare HMO | Source: Ambulatory Visit | Attending: Family | Admitting: Family

## 2014-04-08 ENCOUNTER — Ambulatory Visit (INDEPENDENT_AMBULATORY_CARE_PROVIDER_SITE_OTHER): Payer: Medicare HMO | Admitting: Family

## 2014-04-08 VITALS — BP 130/70 | HR 30 | Resp 12 | Ht 72.0 in | Wt 192.0 lb

## 2014-04-08 DIAGNOSIS — I739 Peripheral vascular disease, unspecified: Secondary | ICD-10-CM

## 2014-04-08 NOTE — Patient Instructions (Signed)

## 2014-04-08 NOTE — Progress Notes (Signed)
[facility] [place] HISTORY AND PHYSICAL -PAD  History of Present Illness Ryan [name] is a 68 y.o. male patient of Dr. Trula Slade who is back today for followup. Dr. Trula Slade met the patient in the hospital. He was originally referred for possible right leg ischemia. 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 He also had infected hardware removed from his right foot. He developed a wound on his right foot. He underwent angiography which revealed an occluded right popliteal artery. We had been managing his wound nonoperatively. He was visiting the wound center frequently and has since been discharged from their care. He is able to ambulate without difficulty although he does use a cane around the house occasionally.  He complains of pain in right groin and thigh when sitting for a long time. He exercises at a gym twice/week, golfs once/month. He denies non healing wounds in feet or legs, states the right toe ulcer has healed. Pt denies any history of stroke or TIA.  He takes coumadin s/p mitral valve replacement.  The patient denies New Medical or Surgical History.  Pt Diabetic: No Pt smoker: non-smoker  Pt meds include: Statin :Yes Betablocker: Yes ASA: No Other anticoagulants/antiplatelets: coumadin  Past Medical History  Diagnosis Date  . Hypertension     Social History History  Substance Use Topics  . Smoking status: Never Smoker   . Smokeless tobacco: Never Used  . Alcohol Use: No    Family History Family History  Problem Relation Age of Onset  . Hypertension Mother   . Hypertension Father     Past Surgical History  Procedure Laterality Date  . I&d extremity  09/14/2012    Procedure: IRRIGATION AND DEBRIDEMENT EXTREMITY;  Surgeon: Tennis Must, MD;  Location: [place];  Service: Orthopedics;  Laterality: Right;  . Extremity wire/pin removal  09/14/2012    Procedure: REMOVAL K-WIRE/PIN EXTREMITY;  Surgeon:  [name], MD;  Location: [place];  Service: Orthopedics;  Laterality: Right;  Right Foot  . Tee without cardioversion  09/18/2012    Procedure: TRANSESOPHAGEAL ECHOCARDIOGRAM (TEE);  Surgeon: [name], MD;  Location: [facility] ENDOSCOPY;  Service: Cardiovascular;  Laterality: N/A;  . Mitral valve replacement  09/26/2012    Procedure: MITRAL VALVE (MV) REPLACEMENT;  Surgeon: [name], MD;  Location: [place];  Service: Open Heart Surgery;  Laterality: N/A;  . Intraoperative transesophageal echocardiogram  09/26/2012    Procedure: INTRAOPERATIVE TRANSESOPHAGEAL ECHOCARDIOGRAM;  Surgeon: [name], MD;  Location: [facility] OR;  Service: Open Heart Surgery;  Laterality: N/A;    No Known Allergies  Current Outpatient Prescriptions  Medication Sig Dispense Refill  . amLODipine (NORVASC) 2.5 MG tablet Take 1 tablet (2.5 mg total) by mouth daily.  30 tablet  5  . atorvastatin (LIPITOR) 40 MG tablet Take 1 tablet (40 mg total) by mouth daily at 6 PM.  30 tablet  5  . bisacodyl (DULCOLAX) 5 MG EC tablet Take 2 tablets (10 mg total) by mouth daily as needed (Give daily if no BM).  60 tablet  5  . cyclobenzaprine (FLEXERIL) 10 MG tablet Take 10 mg by mouth 3 (three) times daily as needed for muscle spasms.      Marland Kitchen DIOVAN 80 MG tablet       . docusate sodium 100 MG CAPS Take 200 mg by mouth daily.  10 capsule  5  . HYDROcodone-acetaminophen (NORCO/VICODIN) 5-325 MG per tablet Take 1 tablet by mouth every  6 (six) hours as needed for moderate pain.      . metoprolol tartrate (LOPRESSOR) 25 MG tablet Take 1/2 tab twice a day      . potassium chloride SA (K-DUR,KLOR-CON) 20 MEQ tablet Take 2 tablets (40 mEq total) by mouth daily.  30 tablet  0  . warfarin (COUMADIN) 2.5 MG tablet Take 1 tablet (2.5 mg total) by mouth one time only at 6 PM.  30 tablet  0   No current facility-administered medications for this visit.    ROS: See HPI for pertinent positives and negatives.   Physical Examination  Filed Vitals:    04/08/14 1439  BP: 130/70  Pulse: 30  Resp: 12  Height: 6' (1.829 m)  Weight: 192 lb (87.091 kg)  SpO2: 98%   Body mass index is 26.03 kg/(m^2).  General: A&O x 3, WDWN. Gait: normal Eyes: Pupils equal Pulmonary: CTAB, without wheezes , rales or rhonchi. Cardiac: regular Rhythm with trigeminal pattern of premature beats.         Carotid Bruits Left Right   Negative Negative  Aorta is not palpable. Radial pulses: are 2+ palpable and =                           VASCULAR EXAM: Extremities without ischemic changes  without Gangrene; without open wounds.                                                                                                           LE Pulses LEFT RIGHT       FEMORAL  2+ palpable  2+ palpable        POPLITEAL  not palpable   not palpable       POSTERIOR TIBIAL  not palpable   not palpable        DORSALIS PEDIS      ANTERIOR TIBIAL not palpable  not palpable        PERONEAL not Palpable   not Palpable    Abdomen: soft, NT, no masses. Skin: no rashes, no ulcers noted. Musculoskeletal: no muscle wasting or atrophy.  Neurologic: A&O X 3; Appropriate Affect ; SENSATION: normal; MOTOR FUNCTION:  moving all extremities equally, motor strength 5/5 throughout. Speech is fluent/normal. CN 2-12 intact.    Non-Invasive Vascular Imaging: DATE: 04/08/2014 ABI: RIGHT 0.85, Waveforms: monophasic;  LEFT 1.14, Waveforms: biphasic Previous (04/02/13) ABI's: Right: 0.69, Left: 1.15   ASSESSMENT: Ryan [name] is a 68 y.o. 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 The symptoms he has in his right groin and inner thigh are not consistent with claudication symptoms, the right foot ulcer where infected hardware was removed has healed.     PLAN:  Graduated walking program as discussed. I discussed in depth with the patient the nature of atherosclerosis, and emphasized the importance of maximal medical management including  strict control of blood pressure, blood glucose, and lipid levels, obtaining regular exercise, and continued cessation of smoking.  The patient is aware that without maximal medical management the underlying atherosclerotic disease process will progress, limiting the benefit of any interventions.  Based on the patient's vascular studies and examination, pt will return to clinic in 1 year for ABI's. He knows to returns sooner if he develops non healing wounds or has worsening claudication symptoms.   The patient was given information about PAD including signs, symptoms, treatment, what symptoms should prompt the patient to seek immediate medical care, and risk reduction measures to take.  Clemon Chambers, RN, MSN, FNP-C [facility] [employer] Phone: [phone]  Clinic MD: Bridgett Larsson  04/08/2014 2:32 PM

## 2014-06-03 ENCOUNTER — Ambulatory Visit (INDEPENDENT_AMBULATORY_CARE_PROVIDER_SITE_OTHER): Payer: Commercial Managed Care - HMO | Admitting: Interventional Cardiology

## 2014-06-03 ENCOUNTER — Encounter: Payer: Self-pay | Admitting: Interventional Cardiology

## 2014-06-03 VITALS — BP 122/68 | HR 35 | Ht 72.0 in | Wt 189.0 lb

## 2014-06-03 DIAGNOSIS — I4891 Unspecified atrial fibrillation: Secondary | ICD-10-CM

## 2014-06-03 DIAGNOSIS — Z79899 Other long term (current) drug therapy: Secondary | ICD-10-CM

## 2014-06-03 DIAGNOSIS — I480 Paroxysmal atrial fibrillation: Secondary | ICD-10-CM

## 2014-06-03 DIAGNOSIS — I5032 Chronic diastolic (congestive) heart failure: Secondary | ICD-10-CM

## 2014-06-03 DIAGNOSIS — Z952 Presence of prosthetic heart valve: Secondary | ICD-10-CM

## 2014-06-03 DIAGNOSIS — Z953 Presence of xenogenic heart valve: Secondary | ICD-10-CM

## 2014-06-03 NOTE — Patient Instructions (Signed)
Your physician recommends that you continue on your current medications as directed. Please refer to the Current Medication list given to you today.  Your physician wants you to follow-up in: 1 year. You will receive a reminder letter in the mail two months in advance. If you don't receive a letter, please call our office to schedule the follow-up appointment.  

## 2014-06-03 NOTE — Progress Notes (Signed)
Patient ID: Ryan [name], male   DOB: 01-Nov-1945, 68 y.o.   MRN: [id number]    [address] [place], [place]  [address] Phone: [phone] Fax:  [phone]  Date:  06/03/2014   ID:  Ryan [name], DOB 06-Apr-1946, MRN [id number]  PCP:  [name], MD   ASSESSMENT:  1. Mitral valve replacement status post endocarditis, for staphylococcal septicemia and endocarditis, 2011, stable 2. Paroxysmal atrial fibrillation 3. Chronic Coumadin anticoagulation therapy 3. Chronic diastolic heart failure, stable  PLAN:  1. Will continue anticoagulation long-term as the patient still has symptomatic palpitations that are likely representative of atrial fibrillation 2. Clinical followup in 12 months 3. Maintain an active lifestyle 4. He should call if persistent palpitations, lightheadedness, syncope, dyspnea. He should also call her blood in his urine or stool.   SUBJECTIVE: Ryan [name] is a 68 y.o. 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 Was diagnosed with embolic phenomena from the valve. Also was found to have paroxysmal atrial fibrillation. He was on amiodarone for arrhythmia suppression but eventually this medication was discontinued. He subsequently underwent a 30 day continuous ambulatory monitor and there was no evidence of recurrent A. fib. Despite this we'll continue anticoagulation therapy. He tells me now that he has anywhere from 30-60 minutes of rapid heart beating once or twice per month. It causes him to feel weak. Is not incapacitating. Is not predictable. No chills, fever, weight loss, or other complaints.   Wt Readings from Last 3 Encounters:  06/03/14 189 lb (85.73 kg)  04/08/14 192 lb (87.091 kg)  11/21/13 197 lb (89.359 kg)     Past Medical History  Diagnosis Date  . Hypertension   . Atrial fibrillation   .  Diabetes mellitus without complication     Current Outpatient Prescriptions  Medication Sig Dispense Refill  . amLODipine (NORVASC) 2.5 MG tablet Take 1 tablet (2.5 mg total) by mouth daily.  30 tablet  5  . atorvastatin (LIPITOR) 40 MG tablet Take 1 tablet (40 mg total) by mouth daily at 6 PM.  30 tablet  5  . bisacodyl (DULCOLAX) 5 MG EC tablet Take 2 tablets (10 mg total) by mouth daily as needed (Give daily if no BM).  60 tablet  5  . cyclobenzaprine (FLEXERIL) 10 MG tablet Take 10 mg by mouth 3 (three) times daily as needed for muscle spasms.      Marland Kitchen DIOVAN 80 MG tablet daily      . docusate sodium 100 MG CAPS Take 200 mg by mouth daily.  10 capsule  5  . HYDROcodone-acetaminophen (NORCO/VICODIN) 5-325 MG per tablet Take 1 tablet by mouth every 6 (six) hours as needed for moderate pain.      . metoprolol tartrate (LOPRESSOR) 25 MG tablet Take 1/2 tab twice a day      . potassium chloride SA (K-DUR,KLOR-CON) 20 MEQ tablet Take 2 tablets (40 mEq total) by mouth daily.  30 tablet  0  . traMADol (ULTRAM) 50 MG tablet Take 50 mg by mouth as needed. Take 1 tab as needed for pain as directed      . triamcinolone cream (KENALOG) 0.5 % Apply 1 application topically as needed.       . warfarin (COUMADIN) 2.5 MG tablet Take 1 tablet (2.5 mg total) by mouth one time only at 6 PM.  30 tablet  0  No current facility-administered medications for this visit.    Allergies:   No Known Allergies  Social History:  The patient  reports that he has never smoked. He has never used smokeless tobacco. He reports that he does not drink alcohol or use illicit drugs.   ROS:  Please see the history of present illness.   Appetite is stable. He has not had syncope. Is no peripheral edema.   All other systems reviewed and negative.   OBJECTIVE: VS:  BP 122/68  Pulse 35  Ht 6' (1.829 m)  Wt 189 lb (85.73 kg)  BMI 25.63 kg/m2 Well nourished, well developed, in no acute distress, slender, appearing younger than  stated age. HEENT: normal Neck: JVD flat. Carotid bruit absent  Cardiac:  normal S1, S2; RRR; no murmur Lungs:  clear to auscultation bilaterally, no wheezing, rhonchi or rales Abd: soft, nontender, no hepatomegaly Ext: Edema absent. Pulses 2+ and symmetric Skin: warm and dry Neuro:  CNs 2-12 intact, no focal abnormalities noted  EKG:  Not       Signed, Illene Labrador III, MD 06/03/2014 12:31 PM

## 2014-08-29 ENCOUNTER — Encounter (HOSPITAL_COMMUNITY): Payer: Self-pay | Admitting: Interventional Cardiology

## 2014-09-25 DIAGNOSIS — M159 Polyosteoarthritis, unspecified: Secondary | ICD-10-CM | POA: Diagnosis not present

## 2014-09-25 DIAGNOSIS — E780 Pure hypercholesterolemia: Secondary | ICD-10-CM | POA: Diagnosis not present

## 2014-09-25 DIAGNOSIS — Z7901 Long term (current) use of anticoagulants: Secondary | ICD-10-CM | POA: Diagnosis not present

## 2014-09-25 DIAGNOSIS — K219 Gastro-esophageal reflux disease without esophagitis: Secondary | ICD-10-CM | POA: Diagnosis not present

## 2014-09-25 DIAGNOSIS — E876 Hypokalemia: Secondary | ICD-10-CM | POA: Diagnosis not present

## 2014-09-25 DIAGNOSIS — I10 Essential (primary) hypertension: Secondary | ICD-10-CM | POA: Diagnosis not present

## 2014-09-25 DIAGNOSIS — N529 Male erectile dysfunction, unspecified: Secondary | ICD-10-CM | POA: Diagnosis not present

## 2014-09-25 DIAGNOSIS — J309 Allergic rhinitis, unspecified: Secondary | ICD-10-CM | POA: Diagnosis not present

## 2014-10-28 DIAGNOSIS — M159 Polyosteoarthritis, unspecified: Secondary | ICD-10-CM | POA: Diagnosis not present

## 2014-10-28 DIAGNOSIS — N529 Male erectile dysfunction, unspecified: Secondary | ICD-10-CM | POA: Diagnosis not present

## 2014-10-28 DIAGNOSIS — Z7901 Long term (current) use of anticoagulants: Secondary | ICD-10-CM | POA: Diagnosis not present

## 2014-10-28 DIAGNOSIS — I10 Essential (primary) hypertension: Secondary | ICD-10-CM | POA: Diagnosis not present

## 2014-10-28 DIAGNOSIS — J309 Allergic rhinitis, unspecified: Secondary | ICD-10-CM | POA: Diagnosis not present

## 2014-10-28 DIAGNOSIS — K219 Gastro-esophageal reflux disease without esophagitis: Secondary | ICD-10-CM | POA: Diagnosis not present

## 2014-10-28 DIAGNOSIS — E780 Pure hypercholesterolemia: Secondary | ICD-10-CM | POA: Diagnosis not present

## 2014-10-28 DIAGNOSIS — Z9181 History of falling: Secondary | ICD-10-CM | POA: Diagnosis not present

## 2014-11-22 ENCOUNTER — Encounter: Payer: Self-pay | Admitting: Interventional Cardiology

## 2014-11-22 ENCOUNTER — Ambulatory Visit (INDEPENDENT_AMBULATORY_CARE_PROVIDER_SITE_OTHER): Payer: Commercial Managed Care - HMO | Admitting: Interventional Cardiology

## 2014-11-22 VITALS — BP 130/69 | HR 75 | Ht 72.0 in | Wt 184.1 lb

## 2014-11-22 DIAGNOSIS — I480 Paroxysmal atrial fibrillation: Secondary | ICD-10-CM

## 2014-11-22 DIAGNOSIS — Z953 Presence of xenogenic heart valve: Secondary | ICD-10-CM | POA: Diagnosis not present

## 2014-11-22 DIAGNOSIS — I5032 Chronic diastolic (congestive) heart failure: Secondary | ICD-10-CM

## 2014-11-22 NOTE — Patient Instructions (Signed)
Your physician recommends that you continue on your current medications as directed. Please refer to the Current Medication list given to you today.  Your physician has recommended that you wear an event monitor. Event monitors are medical devices that record the heart's electrical activity. Doctors most often [place] these monitors to diagnose arrhythmias. Arrhythmias are problems with the speed or rhythm of the heartbeat. The monitor is a small, portable device. You can wear one while you do your normal daily activities. This is usually used to diagnose what is causing palpitations/syncope (passing out).  Your physician wants you to follow-up in: 6 months with Dr.Smith You will receive a reminder letter in the mail two months in advance. If you don't receive a letter, please call our office to schedule the follow-up appointment.

## 2014-11-22 NOTE — Progress Notes (Signed)
Cardiology Office Note   Date:  11/22/2014   ID:  Ryan [name], DOB 03/11/1946, MRN [id number]  PCP:  Ryan Nakai, MD  Cardiologist:   Ryan Grooms, MD   No chief complaint on file.     History of Present Illness: Ryan [name] is a 69 y.o. male who presents for palpitations. He states that intermittently since December his heart will start pound. It causes dyspnea and fatigue. The longest episode is been 24 hours. Other episodes last up to an hour. He can go a week or 2 and not be trouble. He does not recall ever feeling this way before. He has a prior history of atrial flutter/fibrillation during in the carditis and around the time that he had mitral valve replacement.    Past Medical History  Diagnosis Date  . Hypertension   . Atrial fibrillation   . Diabetes mellitus without complication     Past Surgical History  Procedure Laterality Date  . I&d extremity  09/14/2012    Procedure: IRRIGATION AND DEBRIDEMENT EXTREMITY;  Surgeon: Ryan Must, MD;  Location: [place];  Service: Orthopedics;  Laterality: Right;  . Extremity wire/pin removal  09/14/2012    Procedure: REMOVAL K-WIRE/PIN EXTREMITY;  Surgeon: Ryan Corning, MD;  Location: [place];  Service: Orthopedics;  Laterality: Right;  Right Foot  . Tee without cardioversion  09/18/2012    Procedure: TRANSESOPHAGEAL ECHOCARDIOGRAM (TEE);  Surgeon: Ryan Furbish, MD;  Location: [name] ENDOSCOPY;  Service: Cardiovascular;  Laterality: N/A;  . Mitral valve replacement  09/26/2012    Procedure: MITRAL VALVE (MV) REPLACEMENT;  Surgeon: Ryan Pollack, MD;  Location: [place];  Service: Open Heart Surgery;  Laterality: N/A;  . Intraoperative transesophageal echocardiogram  09/26/2012    Procedure: INTRAOPERATIVE TRANSESOPHAGEAL ECHOCARDIOGRAM;  Surgeon: Ryan Pollack, MD;  Location: [name] OR;  Service: Open Heart Surgery;  Laterality: N/A;  . Coronary angiogram  09/21/2012    Procedure: CORONARY ANGIOGRAM;  Surgeon: Ryan Grooms, MD;   Location: [name] CATH LAB;  Service: Cardiovascular;;  . Right heart catheterization  09/21/2012    Procedure: RIGHT HEART CATH;  Surgeon: Ryan Grooms, MD;  Location: [facility] CATH LAB;  Service: Cardiovascular;;  . Abdominal aortagram N/A 10/03/2012    Procedure: ABDOMINAL Maxcine Ham;  Surgeon: Ryan Mitchell, MD;  Location: [facility] CATH LAB;  Service: Cardiovascular;  Laterality: N/A;     Current Outpatient Prescriptions  Medication Sig Dispense Refill  . amLODipine (NORVASC) 2.5 MG tablet Take 1 tablet (2.5 mg total) by mouth daily. 30 tablet 5  . atorvastatin (LIPITOR) 40 MG tablet Take 1 tablet (40 mg total) by mouth daily at 6 PM. 30 tablet 5  . DIOVAN 80 MG tablet daily    . docusate sodium 100 MG CAPS Take 200 mg by mouth daily. 10 capsule 5  . HYDROcodone-acetaminophen (NORCO/VICODIN) 5-325 MG per tablet Take 1 tablet by mouth every 6 (six) hours as needed for moderate pain.    . metoprolol tartrate (LOPRESSOR) 25 MG tablet Take 1/2 tab twice a day    . pantoprazole (PROTONIX) 40 MG tablet Take 1 tablet by mouth daily.    . potassium chloride SA (K-DUR,KLOR-CON) 20 MEQ tablet Take 2 tablets (40 mEq total) by mouth daily. 30 tablet 0  . traMADol (ULTRAM) 50 MG tablet Take 50 mg by mouth as needed. Take 1 tab as needed for pain as directed    . triamcinolone cream (KENALOG) 0.5 % Apply 1 application topically as  needed.     . warfarin (COUMADIN) 6 MG tablet Take 6 mg by mouth every other day , alternating with 9 mg by mouth on opposite days.  1   No current facility-administered medications for this visit.    Allergies:   Review of patient's allergies indicates no known allergies.    Social History:  The patient  reports that he has never smoked. He has never used smokeless tobacco. He reports that he does not drink alcohol or use illicit drugs.   Family History:  The patient's family history includes Hypertension in his father and mother.    ROS:  Please see the history of present  illness.   Otherwise, review of systems are positive for intermittent fatigue. No episodes of syncope..   All other systems are reviewed and negative.    PHYSICAL EXAM: VS:  BP 130/69 mmHg  Pulse 75  Ht 6' (1.829 m)  Wt 184 lb 1.9 oz (83.516 kg)  BMI 24.97 kg/m2 , BMI Body mass index is 24.97 kg/(m^2). GEN: Well nourished, well developed, in no acute distress HEENT: normal Neck: no JVD, carotid bruits, or masses Cardiac: RRR; no murmurs, rubs, or gallops,no edema  Respiratory:  clear to auscultation bilaterally, normal work of breathing GI: soft, nontender, nondistended, + BS MS: no deformity or atrophy Skin: warm and dry, no rash Neuro:  Strength and sensation are intact Psych: euthymic mood, full affect   EKG:  EKG is ordered today. The ekg ordered today demonstrates normal sinus rhythm with ventricular bigeminy   Recent Labs: No results found for requested labs within last 365 days.    Lipid Panel    Component Value Date/Time   CHOL 121 09/16/2012 0520   TRIG 174* 09/16/2012 0520   HDL 6* 09/16/2012 0520   CHOLHDL 20.2 09/16/2012 0520   VLDL 35 09/16/2012 0520   LDLCALC 80 09/16/2012 0520      Wt Readings from Last 3 Encounters:  11/22/14 184 lb 1.9 oz (83.516 kg)  06/03/14 189 lb (85.73 kg)  04/08/14 192 lb (87.091 kg)      Other studies Reviewed: Additional studies/ records that were reviewed today include: .   ASSESSMENT AND PLAN:  Status post mitral valve replacement with bioprosthetic valve - clinically without evidence of dysfunction.  Chronic diastolic heart failure - no evidence of volume overload.  Paroxysmal atrial fibrillation - suspect recurrent atrial fibrillation or flutter given his current symptoms.    Current medicines are reviewed at length with the patient today.  The patient does not have concerns regarding medicines.  The following changes have been made: Continuous ambulatory monitor to exclude malignant arrhythmia and diagnosis  suspected atrial fib flutter.   Labs/ tests ordered today include:   Orders Placed This Encounter  Procedures  . EKG 12-Lead  . Cardiac event monitor     Disposition:   FU with Linard Millers in 6 months   Signed, Ryan Grooms, MD  11/22/2014 3:52 PM    [facility] [address], [place], [place]  [address] Phone: [phone]; Fax: [phone]

## 2014-11-26 ENCOUNTER — Encounter: Payer: Self-pay | Admitting: *Deleted

## 2014-11-26 ENCOUNTER — Encounter (INDEPENDENT_AMBULATORY_CARE_PROVIDER_SITE_OTHER): Payer: Commercial Managed Care - HMO

## 2014-11-26 DIAGNOSIS — I480 Paroxysmal atrial fibrillation: Secondary | ICD-10-CM

## 2014-11-26 NOTE — Progress Notes (Signed)
Patient ID: Ryan [name], male   DOB: 01-Apr-1946, 69 y.o.   MRN: [id number] [employer] 30 day cardiac event monitor to be applied to patient.

## 2014-11-27 DIAGNOSIS — E780 Pure hypercholesterolemia: Secondary | ICD-10-CM | POA: Diagnosis not present

## 2014-11-27 DIAGNOSIS — K219 Gastro-esophageal reflux disease without esophagitis: Secondary | ICD-10-CM | POA: Diagnosis not present

## 2014-11-27 DIAGNOSIS — N529 Male erectile dysfunction, unspecified: Secondary | ICD-10-CM | POA: Diagnosis not present

## 2014-11-27 DIAGNOSIS — M159 Polyosteoarthritis, unspecified: Secondary | ICD-10-CM | POA: Diagnosis not present

## 2014-11-27 DIAGNOSIS — J309 Allergic rhinitis, unspecified: Secondary | ICD-10-CM | POA: Diagnosis not present

## 2014-11-27 DIAGNOSIS — I10 Essential (primary) hypertension: Secondary | ICD-10-CM | POA: Diagnosis not present

## 2014-11-27 DIAGNOSIS — N3281 Overactive bladder: Secondary | ICD-10-CM | POA: Diagnosis not present

## 2014-11-27 DIAGNOSIS — R791 Abnormal coagulation profile: Secondary | ICD-10-CM | POA: Diagnosis not present

## 2014-12-04 DIAGNOSIS — R791 Abnormal coagulation profile: Secondary | ICD-10-CM | POA: Diagnosis not present

## 2014-12-04 DIAGNOSIS — J309 Allergic rhinitis, unspecified: Secondary | ICD-10-CM | POA: Diagnosis not present

## 2014-12-04 DIAGNOSIS — N529 Male erectile dysfunction, unspecified: Secondary | ICD-10-CM | POA: Diagnosis not present

## 2014-12-04 DIAGNOSIS — N3281 Overactive bladder: Secondary | ICD-10-CM | POA: Diagnosis not present

## 2014-12-04 DIAGNOSIS — M159 Polyosteoarthritis, unspecified: Secondary | ICD-10-CM | POA: Diagnosis not present

## 2014-12-04 DIAGNOSIS — I10 Essential (primary) hypertension: Secondary | ICD-10-CM | POA: Diagnosis not present

## 2014-12-04 DIAGNOSIS — K219 Gastro-esophageal reflux disease without esophagitis: Secondary | ICD-10-CM | POA: Diagnosis not present

## 2014-12-04 DIAGNOSIS — E780 Pure hypercholesterolemia: Secondary | ICD-10-CM | POA: Diagnosis not present

## 2015-01-03 DIAGNOSIS — I10 Essential (primary) hypertension: Secondary | ICD-10-CM | POA: Diagnosis not present

## 2015-01-03 DIAGNOSIS — E780 Pure hypercholesterolemia: Secondary | ICD-10-CM | POA: Diagnosis not present

## 2015-01-03 DIAGNOSIS — M159 Polyosteoarthritis, unspecified: Secondary | ICD-10-CM | POA: Diagnosis not present

## 2015-01-03 DIAGNOSIS — N529 Male erectile dysfunction, unspecified: Secondary | ICD-10-CM | POA: Diagnosis not present

## 2015-01-03 DIAGNOSIS — R791 Abnormal coagulation profile: Secondary | ICD-10-CM | POA: Diagnosis not present

## 2015-01-03 DIAGNOSIS — J309 Allergic rhinitis, unspecified: Secondary | ICD-10-CM | POA: Diagnosis not present

## 2015-01-03 DIAGNOSIS — K219 Gastro-esophageal reflux disease without esophagitis: Secondary | ICD-10-CM | POA: Diagnosis not present

## 2015-01-03 DIAGNOSIS — N3281 Overactive bladder: Secondary | ICD-10-CM | POA: Diagnosis not present

## 2015-01-07 ENCOUNTER — Telehealth: Payer: Self-pay

## 2015-01-07 NOTE — Telephone Encounter (Signed)
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

## 2015-01-14 ENCOUNTER — Telehealth: Payer: Self-pay | Admitting: *Deleted

## 2015-01-14 DIAGNOSIS — K219 Gastro-esophageal reflux disease without esophagitis: Secondary | ICD-10-CM | POA: Diagnosis not present

## 2015-01-14 DIAGNOSIS — M159 Polyosteoarthritis, unspecified: Secondary | ICD-10-CM | POA: Diagnosis not present

## 2015-01-14 DIAGNOSIS — E780 Pure hypercholesterolemia: Secondary | ICD-10-CM | POA: Diagnosis not present

## 2015-01-14 DIAGNOSIS — S8011XA Contusion of right lower leg, initial encounter: Secondary | ICD-10-CM | POA: Diagnosis not present

## 2015-01-14 DIAGNOSIS — N3281 Overactive bladder: Secondary | ICD-10-CM | POA: Diagnosis not present

## 2015-01-14 DIAGNOSIS — J309 Allergic rhinitis, unspecified: Secondary | ICD-10-CM | POA: Diagnosis not present

## 2015-01-14 DIAGNOSIS — N529 Male erectile dysfunction, unspecified: Secondary | ICD-10-CM | POA: Diagnosis not present

## 2015-01-14 DIAGNOSIS — I10 Essential (primary) hypertension: Secondary | ICD-10-CM | POA: Diagnosis not present

## 2015-01-14 NOTE — Telephone Encounter (Signed)
Ryan [name] called to report that approximately 2 weeks ago he had a board fall on his RLE Ryan [name] area) which caused swelling and pain. He saw his PCP, Ryan [name], who rx pain meds and told him to watch it for signs of infection. Patient is on Coumadin which is monitored by Ryan [name]. Patient called in to our triage today stating that he was still having swelling on his shin and it was inflamed at times. He has a history of staph infection per patient. I recommended that he contact Ryan [name] office today and notify them that he may have possible infection vs. DVT. I called Ryan [name] office and spoke to a [facility], she said that when patient calls them, she will see that he is evaluated today. They will call us if a formal duplex is needed to rule out a DVT in patient's RLE. We will go ahead with moving up his year appt that is presently scheduled on 04-07-15 with our NP; pt will also have ABIs with that visit. Ryan [name] voiced understanding and agreement of this plan. Our office staff, Ryan [name], will call him to move up his appt with our NP.

## 2015-01-20 DIAGNOSIS — H25813 Combined forms of age-related cataract, bilateral: Secondary | ICD-10-CM | POA: Diagnosis not present

## 2015-02-03 ENCOUNTER — Encounter: Payer: Self-pay | Admitting: Family

## 2015-02-05 ENCOUNTER — Ambulatory Visit (INDEPENDENT_AMBULATORY_CARE_PROVIDER_SITE_OTHER): Payer: Commercial Managed Care - HMO | Admitting: Family

## 2015-02-05 ENCOUNTER — Ambulatory Visit (HOSPITAL_COMMUNITY)
Admission: RE | Admit: 2015-02-05 | Discharge: 2015-02-05 | Disposition: A | Discharge status: Home / Self Care | Payer: Commercial Managed Care - HMO | Source: Ambulatory Visit | Attending: Family | Admitting: Family

## 2015-02-05 ENCOUNTER — Encounter: Payer: Self-pay | Admitting: Family

## 2015-02-05 VITALS — BP 127/69 | HR 69 | Resp 16 | Ht 72.0 in | Wt 183.0 lb

## 2015-02-05 DIAGNOSIS — I743 Embolism and thrombosis of arteries of the lower extremities: Secondary | ICD-10-CM

## 2015-02-05 DIAGNOSIS — S80921A Unspecified superficial injury of right lower leg, initial encounter: Secondary | ICD-10-CM | POA: Diagnosis not present

## 2015-02-05 DIAGNOSIS — I739 Peripheral vascular disease, unspecified: Secondary | ICD-10-CM | POA: Diagnosis not present

## 2015-02-05 DIAGNOSIS — I10 Essential (primary) hypertension: Secondary | ICD-10-CM | POA: Insufficient documentation

## 2015-02-05 DIAGNOSIS — E785 Hyperlipidemia, unspecified: Secondary | ICD-10-CM | POA: Insufficient documentation

## 2015-02-05 DIAGNOSIS — I70201 Unspecified atherosclerosis of native arteries of extremities, right leg: Secondary | ICD-10-CM

## 2015-02-05 DIAGNOSIS — M7989 Other specified soft tissue disorders: Secondary | ICD-10-CM | POA: Insufficient documentation

## 2015-02-05 NOTE — Patient Instructions (Signed)

## 2015-02-05 NOTE — Progress Notes (Signed)
[facility] OF [place] HISTORY AND PHYSICAL -PAD  History of Present Illness Ryan [name] is a 69 y.o. male patient of Dr. Trula Slade who is back today for followup. Dr. Trula Slade met the patient in the hospital. He was originally referred for possible right leg ischemia. 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 He also had infected hardware removed from his right foot. He developed a wound on his right foot. He underwent angiography which revealed an occluded right popliteal artery. We had been managing his wound nonoperatively. He was visiting the wound center frequently and has since been discharged from their care. He is able to ambulate without difficulty although he does use a cane around the house occasionally.   He exercises at a gym 1-2x/week, golfs once/month. He denies non healing wounds in feet or legs, states the right toe ulcer has healed. Pt denies any history of stroke or TIA.  He takes coumadin s/p mitral valve replacement.  The patient reports New Medical or Surgical History: a board fell on pt's right lower leg in April 2016, was not medically evaluated for this at the time of injury, but has since seen his PCP, he elevated his right leg and placed an ice pack on it. Swelling and soreness have decreased, no drainage.   Pt Diabetic: No Pt smoker: non-smoker  Pt meds include: Statin :Yes Betablocker: Yes ASA: No Other anticoagulants/antiplatelets: coumadin     Past Medical History  Diagnosis Date  . Hypertension   . Atrial fibrillation   . Diabetes mellitus without complication   . Peripheral vascular disease     Social History History  Substance Use Topics  . Smoking status: Never Smoker   . Smokeless tobacco: Never Used  . Alcohol Use: No    Family History Family History  Problem Relation Age of Onset  . Hypertension Mother   . Hypertension Father     Past Surgical History  Procedure  Laterality Date  . I&d extremity  09/14/2012    Procedure: IRRIGATION AND DEBRIDEMENT EXTREMITY;  Surgeon: Tennis Must, MD;  Location: [place];  Service: Orthopedics;  Laterality: Right;  . Extremity wire/pin removal  09/14/2012    Procedure: REMOVAL K-WIRE/PIN EXTREMITY;  Surgeon: [name], MD;  Location: [place];  Service: Orthopedics;  Laterality: Right;  Right Foot  . Tee without cardioversion  09/18/2012    Procedure: TRANSESOPHAGEAL ECHOCARDIOGRAM (TEE);  Surgeon: [name], MD;  Location: [facility] ENDOSCOPY;  Service: Cardiovascular;  Laterality: N/A;  . Mitral valve replacement  09/26/2012    Procedure: MITRAL VALVE (MV) REPLACEMENT;  Surgeon: [name], MD;  Location: [place];  Service: Open Heart Surgery;  Laterality: N/A;  . Intraoperative transesophageal echocardiogram  09/26/2012    Procedure: INTRAOPERATIVE TRANSESOPHAGEAL ECHOCARDIOGRAM;  Surgeon: [name], MD;  Location: [facility] OR;  Service: Open Heart Surgery;  Laterality: N/A;  . Coronary angiogram  09/21/2012    Procedure: CORONARY ANGIOGRAM;  Surgeon: [name], MD;  Location: [facility] CATH LAB;  Service: Cardiovascular;;  . Right heart catheterization  09/21/2012    Procedure: RIGHT HEART CATH;  Surgeon: [name], MD;  Location: [facility] CATH LAB;  Service: Cardiovascular;;  . Abdominal aortagram N/A 10/03/2012    Procedure: ABDOMINAL Maxcine Ham;  Surgeon: [name], MD;  Location: [facility] CATH LAB;  Service: Cardiovascular;  Laterality: N/A;    No Known Allergies  Current Outpatient Prescriptions  Medication Sig Dispense Refill  .  amLODipine (NORVASC) 2.5 MG tablet Take 1 tablet (2.5 mg total) by mouth daily. 30 tablet 5  . atorvastatin (LIPITOR) 40 MG tablet Take 1 tablet (40 mg total) by mouth daily at 6 PM. 30 tablet 5  . DIOVAN 80 MG tablet daily    . docusate sodium 100 MG CAPS Take 200 mg by mouth daily. 10 capsule 5  . HYDROcodone-acetaminophen (NORCO/VICODIN) 5-325 MG per tablet Take 1 tablet by mouth every 6  (six) hours as needed for moderate pain.    . metoprolol tartrate (LOPRESSOR) 25 MG tablet Take 1/2 tab twice a day    . pantoprazole (PROTONIX) 40 MG tablet Take 1 tablet by mouth daily.    . potassium chloride SA (K-DUR,KLOR-CON) 20 MEQ tablet Take 2 tablets (40 mEq total) by mouth daily. 30 tablet 0  . traMADol (ULTRAM) 50 MG tablet Take 50 mg by mouth as needed. Take 1 tab as needed for pain as directed    . triamcinolone cream (KENALOG) 0.5 % Apply 1 application topically as needed.     . warfarin (COUMADIN) 6 MG tablet Take 6 mg by mouth every other day , alternating with 9 mg by mouth on opposite days.  1   No current facility-administered medications for this visit.    ROS: See HPI for pertinent positives and negatives.   Physical Examination  Filed Vitals:   02/05/15 1355  BP: 127/69  Pulse: 69  Resp: 16  Height: 6' (1.829 m)  Weight: 183 lb (83.008 kg)  SpO2: 100%   Body mass index is 24.81 kg/(m^2).  General: A&O x 3, WDWN. Gait: normal Eyes: Pupils equal Pulmonary: CTAB, without wheezes , rales or rhonchi. Cardiac: regular Rhythm, no detected murmur     Carotid Bruits Left Right   Negative Negative  Aorta is not palpable. Radial pulses: are 2+ palpable and =   VASCULAR EXAM: Extremities without ischemic changes  without Gangrene; without open wounds. Medial aspect and proximal to right ankle has some mildly tender to touch swelling, minimal erythema, no open wound.      LE Pulses LEFT RIGHT   FEMORAL 2+ palpable 2+ palpable    POPLITEAL not palpable  not palpable   POSTERIOR TIBIAL not palpable  not palpable    DORSALIS PEDIS  ANTERIOR TIBIAL not palpable  not palpable    PERONEAL not Palpable   not Palpable    Abdomen:  soft, NT, no palpable masses. Skin: no rashes, no ulcers. Musculoskeletal: no muscle wasting or atrophy. Neurologic: A&O X 3; Appropriate Affect ; SENSATION: normal; MOTOR FUNCTION: moving all extremities equally, motor strength 5/5 throughout. Speech is fluent/normal. CN 2-12 intact.           Non-Invasive Vascular Imaging: DATE: 02/05/2015 ABI: RIGHT 0.77 (04/08/14, 0.85), Waveforms: monophasic, TBI: 0.65;  LEFT 1.19 (1.14), Waveforms: tri and biphasic; TBI: 0.63   ASSESSMENT: Ryan [name] is a 69 y.o. 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 Bilateral TBI's have improved in a year, left ABI remains normal, right ABI decreased to 0.77 from 0.85 in a year, possibly due to swelling from the injury to right lower leg, and possible decrease in activity due to leg injury.  PLAN:  Seated leg exercises daily as discussed and demonstrated.  Continue exercise regimen. I discussed in depth  with the patient the nature of atherosclerosis, and emphasized the importance of maximal medical management including strict control of blood pressure, blood glucose, and lipid levels, obtaining regular exercise, and continued cessation of smoking.  The patient is aware that without maximal medical management the underlying atherosclerotic disease process will progress, limiting the benefit of any interventions.  Based on the patient's vascular studies and examination, pt will return to clinic in 1 year for ABI's, he knows to return sooner if needed.   The patient was given information about PAD including signs, symptoms, treatment, what symptoms should prompt the patient to seek immediate medical care, and risk reduction measures to take.  Clemon Chambers, RN, MSN, FNP-C [facility] of  [employer] Phone: [phone]  Clinic MD: Scot Dock  02/05/2015 2:10 PM

## 2015-02-10 DIAGNOSIS — I10 Essential (primary) hypertension: Secondary | ICD-10-CM | POA: Diagnosis not present

## 2015-02-10 DIAGNOSIS — J309 Allergic rhinitis, unspecified: Secondary | ICD-10-CM | POA: Diagnosis not present

## 2015-02-10 DIAGNOSIS — Z9181 History of falling: Secondary | ICD-10-CM | POA: Diagnosis not present

## 2015-02-10 DIAGNOSIS — Z7901 Long term (current) use of anticoagulants: Secondary | ICD-10-CM | POA: Diagnosis not present

## 2015-02-10 DIAGNOSIS — E785 Hyperlipidemia, unspecified: Secondary | ICD-10-CM | POA: Diagnosis not present

## 2015-02-10 DIAGNOSIS — K219 Gastro-esophageal reflux disease without esophagitis: Secondary | ICD-10-CM | POA: Diagnosis not present

## 2015-02-10 DIAGNOSIS — Z1389 Encounter for screening for other disorder: Secondary | ICD-10-CM | POA: Diagnosis not present

## 2015-02-10 DIAGNOSIS — M159 Polyosteoarthritis, unspecified: Secondary | ICD-10-CM | POA: Diagnosis not present

## 2015-02-18 DIAGNOSIS — N529 Male erectile dysfunction, unspecified: Secondary | ICD-10-CM | POA: Diagnosis not present

## 2015-02-18 DIAGNOSIS — E780 Pure hypercholesterolemia: Secondary | ICD-10-CM | POA: Diagnosis not present

## 2015-02-18 DIAGNOSIS — I10 Essential (primary) hypertension: Secondary | ICD-10-CM | POA: Diagnosis not present

## 2015-02-18 DIAGNOSIS — Z7901 Long term (current) use of anticoagulants: Secondary | ICD-10-CM | POA: Diagnosis not present

## 2015-02-18 DIAGNOSIS — M159 Polyosteoarthritis, unspecified: Secondary | ICD-10-CM | POA: Diagnosis not present

## 2015-02-18 DIAGNOSIS — J309 Allergic rhinitis, unspecified: Secondary | ICD-10-CM | POA: Diagnosis not present

## 2015-02-18 DIAGNOSIS — N3281 Overactive bladder: Secondary | ICD-10-CM | POA: Diagnosis not present

## 2015-02-18 DIAGNOSIS — K219 Gastro-esophageal reflux disease without esophagitis: Secondary | ICD-10-CM | POA: Diagnosis not present

## 2015-03-20 DIAGNOSIS — Z7901 Long term (current) use of anticoagulants: Secondary | ICD-10-CM | POA: Diagnosis not present

## 2015-04-07 ENCOUNTER — Ambulatory Visit: Payer: Medicare HMO | Admitting: Family

## 2015-04-07 ENCOUNTER — Encounter (HOSPITAL_COMMUNITY): Payer: Medicare HMO

## 2015-04-18 DIAGNOSIS — Z7901 Long term (current) use of anticoagulants: Secondary | ICD-10-CM | POA: Diagnosis not present

## 2015-04-22 DIAGNOSIS — E780 Pure hypercholesterolemia: Secondary | ICD-10-CM | POA: Diagnosis not present

## 2015-04-22 DIAGNOSIS — N3281 Overactive bladder: Secondary | ICD-10-CM | POA: Diagnosis not present

## 2015-04-22 DIAGNOSIS — J309 Allergic rhinitis, unspecified: Secondary | ICD-10-CM | POA: Diagnosis not present

## 2015-04-22 DIAGNOSIS — M159 Polyosteoarthritis, unspecified: Secondary | ICD-10-CM | POA: Diagnosis not present

## 2015-04-22 DIAGNOSIS — N529 Male erectile dysfunction, unspecified: Secondary | ICD-10-CM | POA: Diagnosis not present

## 2015-04-22 DIAGNOSIS — K219 Gastro-esophageal reflux disease without esophagitis: Secondary | ICD-10-CM | POA: Diagnosis not present

## 2015-04-22 DIAGNOSIS — M659 Synovitis and tenosynovitis, unspecified: Secondary | ICD-10-CM | POA: Diagnosis not present

## 2015-04-22 DIAGNOSIS — I10 Essential (primary) hypertension: Secondary | ICD-10-CM | POA: Diagnosis not present

## 2015-04-28 DIAGNOSIS — E876 Hypokalemia: Secondary | ICD-10-CM | POA: Diagnosis not present

## 2015-04-28 DIAGNOSIS — E780 Pure hypercholesterolemia: Secondary | ICD-10-CM | POA: Diagnosis not present

## 2015-04-28 DIAGNOSIS — K219 Gastro-esophageal reflux disease without esophagitis: Secondary | ICD-10-CM | POA: Diagnosis not present

## 2015-04-28 DIAGNOSIS — J309 Allergic rhinitis, unspecified: Secondary | ICD-10-CM | POA: Diagnosis not present

## 2015-04-28 DIAGNOSIS — N3281 Overactive bladder: Secondary | ICD-10-CM | POA: Diagnosis not present

## 2015-04-28 DIAGNOSIS — M159 Polyosteoarthritis, unspecified: Secondary | ICD-10-CM | POA: Diagnosis not present

## 2015-04-28 DIAGNOSIS — I10 Essential (primary) hypertension: Secondary | ICD-10-CM | POA: Diagnosis not present

## 2015-04-28 DIAGNOSIS — N529 Male erectile dysfunction, unspecified: Secondary | ICD-10-CM | POA: Diagnosis not present

## 2015-05-27 DIAGNOSIS — M159 Polyosteoarthritis, unspecified: Secondary | ICD-10-CM | POA: Diagnosis not present

## 2015-05-27 DIAGNOSIS — N529 Male erectile dysfunction, unspecified: Secondary | ICD-10-CM | POA: Diagnosis not present

## 2015-05-27 DIAGNOSIS — J309 Allergic rhinitis, unspecified: Secondary | ICD-10-CM | POA: Diagnosis not present

## 2015-05-27 DIAGNOSIS — N183 Chronic kidney disease, stage 3 (moderate): Secondary | ICD-10-CM | POA: Diagnosis not present

## 2015-05-27 DIAGNOSIS — K219 Gastro-esophageal reflux disease without esophagitis: Secondary | ICD-10-CM | POA: Diagnosis not present

## 2015-05-27 DIAGNOSIS — I10 Essential (primary) hypertension: Secondary | ICD-10-CM | POA: Diagnosis not present

## 2015-05-27 DIAGNOSIS — E780 Pure hypercholesterolemia: Secondary | ICD-10-CM | POA: Diagnosis not present

## 2015-05-27 DIAGNOSIS — N3281 Overactive bladder: Secondary | ICD-10-CM | POA: Diagnosis not present

## 2015-06-09 NOTE — Progress Notes (Signed)
Cardiology Office Note   Date:  06/10/2015   ID:  Ryan [name], DOB 1946/04/19, MRN [id number]  PCP:  [name], MD  Cardiologist:  [name], MD   Chief Complaint  Patient presents with  . Hypertension  . Atrial Fibrillation      History of Present Illness: Ryan [name] is a 69 y.o. male who presents for atrial fibrillation/flutter, hypertension, mitral valve replacement (bioprosthesis), and chronic anticoagulation therapy.  Over the past 12 months the patient has had 2 episodes of tachycardia. One lasting nearly 24 hours. He did not seek attention. He feels weak and tired. The most recent episode occurred one month ago and lasted less than an hour. We have done a 30 day monitor and 48 hour Holter monitor and have not identified any episodes of atrial fibrillation or flutter. He denies orthopnea, PND, chest pain, and syncope. No chills or fever.  Past Medical History  Diagnosis Date  . Hypertension   . Atrial fibrillation   . Diabetes mellitus without complication   . Peripheral vascular disease     Past Surgical History  Procedure Laterality Date  . I&d extremity  09/14/2012    Procedure: IRRIGATION AND DEBRIDEMENT EXTREMITY;  Surgeon: Tennis Must, MD;  Location: [place];  Service: Orthopedics;  Laterality: Right;  . Extremity wire/pin removal  09/14/2012    Procedure: REMOVAL K-WIRE/PIN EXTREMITY;  Surgeon: [name], MD;  Location: [place];  Service: Orthopedics;  Laterality: Right;  Right Foot  . Tee without cardioversion  09/18/2012    Procedure: TRANSESOPHAGEAL ECHOCARDIOGRAM (TEE);  Surgeon: [name], MD;  Location: [facility] ENDOSCOPY;  Service: Cardiovascular;  Laterality: N/A;  . Mitral valve replacement  09/26/2012    Procedure: MITRAL VALVE (MV) REPLACEMENT;  Surgeon: [name], MD;  Location: [place];  Service: Open Heart Surgery;  Laterality: N/A;  . Intraoperative transesophageal echocardiogram  09/26/2012    Procedure: INTRAOPERATIVE  TRANSESOPHAGEAL ECHOCARDIOGRAM;  Surgeon: [name], MD;  Location: [facility] OR;  Service: Open Heart Surgery;  Laterality: N/A;  . Coronary angiogram  09/21/2012    Procedure: CORONARY ANGIOGRAM;  Surgeon: [name], MD;  Location: [facility] CATH LAB;  Service: Cardiovascular;;  . Right heart catheterization  09/21/2012    Procedure: RIGHT HEART CATH;  Surgeon: [name], MD;  Location: [facility] CATH LAB;  Service: Cardiovascular;;  . Abdominal aortagram N/A 10/03/2012    Procedure: ABDOMINAL Maxcine Ham;  Surgeon: [name], MD;  Location: [facility] CATH LAB;  Service: Cardiovascular;  Laterality: N/A;     Current Outpatient Prescriptions  Medication Sig Dispense Refill  . amLODipine (NORVASC) 2.5 MG tablet Take 1 tablet (2.5 mg total) by mouth daily. 30 tablet 5  . atorvastatin (LIPITOR) 40 MG tablet Take 1 tablet (40 mg total) by mouth daily at 6 PM. 30 tablet 5  . DIOVAN 80 MG tablet Take 80 mg by mouth daily.     Marland Kitchen docusate sodium 100 MG CAPS Take 200 mg by mouth daily. 10 capsule 5  . HYDROcodone-acetaminophen (NORCO/VICODIN) 5-325 MG per tablet Take 1 tablet by mouth every 6 (six) hours as needed for moderate pain.    . metoprolol tartrate (LOPRESSOR) 25 MG tablet Take 12.5 mg by mouth daily.    . pantoprazole (PROTONIX) 40 MG tablet Take 1 tablet by mouth daily.    . potassium chloride SA (K-DUR,KLOR-CON) 20 MEQ tablet Take 2 tablets (40 mEq total) by mouth daily. 30 tablet 0  .  traMADol (ULTRAM) 50 MG tablet Take 50 mg by mouth as needed. Take 1 tab as needed for pain as directed    . triamcinolone cream (KENALOG) 0.5 % Apply 1 application topically as needed (SKIN).     Marland Kitchen warfarin (COUMADIN) 6 MG tablet Take 6 mg by mouth every other day , alternating with 9 mg by mouth on opposite days.  1   No current facility-administered medications for this visit.    Allergies:   Review of patient's allergies indicates no known allergies.    Social History:  The patient  reports that he has  never smoked. He has never used smokeless tobacco. He reports that he does not drink alcohol or use illicit drugs.   Family History:  The patient's family history includes Hypertension in his father and mother.    ROS:  Please see the history of present illness.   Otherwise, review of systems are positive for anxiety, back discomfort, otherwise no complaints..   All other systems are reviewed and negative.    PHYSICAL EXAM: VS:  BP 128/70 mmHg  Pulse 36  Ht 6' (1.829 m)  Wt 81.557 kg (179 lb 12.8 oz)  BMI 24.38 kg/m2  SpO2 97% , BMI Body mass index is 24.38 kg/(m^2). GEN: Well nourished, well developed, in no acute distress HEENT: normal Neck: no JVD, carotid bruits, or masses Cardiac: IRR in a bigeminal pattern.  There is no murmur, rub, or gallop. There is no edema. Respiratory:  clear to auscultation bilaterally, normal work of breathing. GI: soft, nontender, nondistended, + BS MS: no deformity or atrophy Skin: warm and dry, no rash Neuro:  Strength and sensation are intact Psych: euthymic mood, full affect   EKG:  EKG is not ordered today. The ekg reveals sinus rhythm with ventricular bigeminy. This EKG is from March 2016.   Recent Labs: No results found for requested labs within last 365 days.    Lipid Panel    Component Value Date/Time   CHOL 121 09/16/2012 0520   TRIG 174* 09/16/2012 0520   HDL 6* 09/16/2012 0520   CHOLHDL 20.2 09/16/2012 0520   VLDL 35 09/16/2012 0520   LDLCALC 80 09/16/2012 0520      Wt Readings from Last 3 Encounters:  06/10/15 81.557 kg (179 lb 12.8 oz)  02/05/15 83.008 kg (183 lb)  11/22/14 83.516 kg (184 lb 1.9 oz)      Other studies Reviewed: Additional studies/ records that were reviewed today include: Reviewed monitors.. The findings include no documented A. fib or a flutter.    ASSESSMENT AND PLAN:  1. Essential hypertension Excellent control  2. Chronic diastolic heart failure No volume overload  3. Status post  mitral valve replacement with bioprosthetic valve No auscultatory evidence of valve dysfunction  4. Paroxysmal atrial fibrillation History suggests to paroxysmal of atrial fibrillation lasting greater than 30 minutes over the past 12 months.  5. Peripheral vascular disease No complaints.  6. On amiodarone therapy Medication his been discontinued.    Current medicines are reviewed at length with the patient today.  The patient has the following concerns regarding medicines: We contemplated whether anticoagulation should be discontinued but decided against this based upon patient's history..  The following changes/actions have been instituted:    Continue Coumadin therapy in definitely.  Labs/ tests ordered today include:  No orders of the defined types were placed in this encounter.     Disposition:   FU with HS in 1 year  Signed, Olen Pel  Viona Gilmore, MD  06/10/2015 8:56 AM    [facility] [place], [place], [place]  [address] Phone: [phone]; Fax: [phone]

## 2015-06-10 ENCOUNTER — Ambulatory Visit (INDEPENDENT_AMBULATORY_CARE_PROVIDER_SITE_OTHER): Payer: Commercial Managed Care - HMO | Admitting: Interventional Cardiology

## 2015-06-10 ENCOUNTER — Encounter: Payer: Self-pay | Admitting: Interventional Cardiology

## 2015-06-10 VITALS — BP 128/70 | HR 36 | Ht 72.0 in | Wt 179.8 lb

## 2015-06-10 DIAGNOSIS — I5032 Chronic diastolic (congestive) heart failure: Secondary | ICD-10-CM

## 2015-06-10 DIAGNOSIS — I10 Essential (primary) hypertension: Secondary | ICD-10-CM

## 2015-06-10 DIAGNOSIS — Z953 Presence of xenogenic heart valve: Secondary | ICD-10-CM

## 2015-06-10 DIAGNOSIS — Z79899 Other long term (current) drug therapy: Secondary | ICD-10-CM

## 2015-06-10 DIAGNOSIS — I480 Paroxysmal atrial fibrillation: Secondary | ICD-10-CM

## 2015-06-10 DIAGNOSIS — I739 Peripheral vascular disease, unspecified: Secondary | ICD-10-CM

## 2015-06-10 NOTE — Patient Instructions (Signed)
Medication Instructions:  Your physician recommends that you continue on your current medications as directed. Please refer to the Current Medication list given to you today.   Labwork: None ordered  Testing/Procedures: None ordered  Follow-Up: Your physician wants you to follow-up in: 1 year with Dr.Smith receive a reminder letter in the mail two months in advance. If you don't receive a letter, please call our office to schedule the follow-up appointment.   Any Other Special Instructions Will Be Listed Below (If Applicable). Call the office if your are having prolonged Atrial Fibrillation >2-3 hours

## 2015-06-26 DIAGNOSIS — Z23 Encounter for immunization: Secondary | ICD-10-CM | POA: Diagnosis not present

## 2015-06-26 DIAGNOSIS — J309 Allergic rhinitis, unspecified: Secondary | ICD-10-CM | POA: Diagnosis not present

## 2015-06-26 DIAGNOSIS — I10 Essential (primary) hypertension: Secondary | ICD-10-CM | POA: Diagnosis not present

## 2015-06-26 DIAGNOSIS — N529 Male erectile dysfunction, unspecified: Secondary | ICD-10-CM | POA: Diagnosis not present

## 2015-06-26 DIAGNOSIS — K219 Gastro-esophageal reflux disease without esophagitis: Secondary | ICD-10-CM | POA: Diagnosis not present

## 2015-06-26 DIAGNOSIS — M159 Polyosteoarthritis, unspecified: Secondary | ICD-10-CM | POA: Diagnosis not present

## 2015-06-26 DIAGNOSIS — Z7901 Long term (current) use of anticoagulants: Secondary | ICD-10-CM | POA: Diagnosis not present

## 2015-06-26 DIAGNOSIS — N3281 Overactive bladder: Secondary | ICD-10-CM | POA: Diagnosis not present

## 2015-07-24 DIAGNOSIS — I10 Essential (primary) hypertension: Secondary | ICD-10-CM | POA: Diagnosis not present

## 2015-07-24 DIAGNOSIS — Z7901 Long term (current) use of anticoagulants: Secondary | ICD-10-CM | POA: Diagnosis not present

## 2015-07-24 DIAGNOSIS — N529 Male erectile dysfunction, unspecified: Secondary | ICD-10-CM | POA: Diagnosis not present

## 2015-07-24 DIAGNOSIS — M159 Polyosteoarthritis, unspecified: Secondary | ICD-10-CM | POA: Diagnosis not present

## 2015-07-24 DIAGNOSIS — N3281 Overactive bladder: Secondary | ICD-10-CM | POA: Diagnosis not present

## 2015-07-24 DIAGNOSIS — K219 Gastro-esophageal reflux disease without esophagitis: Secondary | ICD-10-CM | POA: Diagnosis not present

## 2015-07-24 DIAGNOSIS — J309 Allergic rhinitis, unspecified: Secondary | ICD-10-CM | POA: Diagnosis not present

## 2015-07-24 DIAGNOSIS — E7800 Pure hypercholesterolemia, unspecified: Secondary | ICD-10-CM | POA: Diagnosis not present

## 2015-08-07 DIAGNOSIS — Z7901 Long term (current) use of anticoagulants: Secondary | ICD-10-CM | POA: Diagnosis not present

## 2015-08-29 ENCOUNTER — Encounter (HOSPITAL_COMMUNITY): Payer: Self-pay | Admitting: Emergency Medicine

## 2015-08-29 ENCOUNTER — Inpatient Hospital Stay (HOSPITAL_COMMUNITY)
Admission: EM | Admit: 2015-08-29 | Discharge: 2015-08-30 | DRG: 683 | Disposition: A | Discharge status: Home / Self Care | Payer: Commercial Managed Care - HMO | Attending: Internal Medicine | Admitting: Internal Medicine

## 2015-08-29 ENCOUNTER — Emergency Department (HOSPITAL_COMMUNITY): Payer: Commercial Managed Care - HMO

## 2015-08-29 DIAGNOSIS — I110 Hypertensive heart disease with heart failure: Secondary | ICD-10-CM | POA: Diagnosis present

## 2015-08-29 DIAGNOSIS — R000 Tachycardia, unspecified: Secondary | ICD-10-CM

## 2015-08-29 DIAGNOSIS — I480 Paroxysmal atrial fibrillation: Secondary | ICD-10-CM | POA: Diagnosis not present

## 2015-08-29 DIAGNOSIS — E119 Type 2 diabetes mellitus without complications: Secondary | ICD-10-CM | POA: Diagnosis present

## 2015-08-29 DIAGNOSIS — I4892 Unspecified atrial flutter: Secondary | ICD-10-CM | POA: Diagnosis not present

## 2015-08-29 DIAGNOSIS — Z953 Presence of xenogenic heart valve: Secondary | ICD-10-CM

## 2015-08-29 DIAGNOSIS — I4891 Unspecified atrial fibrillation: Secondary | ICD-10-CM | POA: Diagnosis not present

## 2015-08-29 DIAGNOSIS — I471 Supraventricular tachycardia, unspecified: Secondary | ICD-10-CM | POA: Insufficient documentation

## 2015-08-29 DIAGNOSIS — I10 Essential (primary) hypertension: Secondary | ICD-10-CM | POA: Diagnosis present

## 2015-08-29 DIAGNOSIS — Z8249 Family history of ischemic heart disease and other diseases of the circulatory system: Secondary | ICD-10-CM | POA: Diagnosis not present

## 2015-08-29 DIAGNOSIS — I739 Peripheral vascular disease, unspecified: Secondary | ICD-10-CM | POA: Diagnosis not present

## 2015-08-29 DIAGNOSIS — Z7901 Long term (current) use of anticoagulants: Secondary | ICD-10-CM | POA: Diagnosis not present

## 2015-08-29 DIAGNOSIS — R0602 Shortness of breath: Secondary | ICD-10-CM | POA: Diagnosis not present

## 2015-08-29 DIAGNOSIS — N179 Acute kidney failure, unspecified: Secondary | ICD-10-CM | POA: Diagnosis present

## 2015-08-29 DIAGNOSIS — I5032 Chronic diastolic (congestive) heart failure: Secondary | ICD-10-CM | POA: Diagnosis not present

## 2015-08-29 HISTORY — DX: Endocarditis, valve unspecified: I38

## 2015-08-29 LAB — CBC WITH DIFFERENTIAL/PLATELET
Basophils Absolute: 0 10*3/uL (ref 0.0–0.1)
Basophils Relative: 0 %
Eosinophils Absolute: 0 10*3/uL (ref 0.0–0.7)
Eosinophils Relative: 0 %
HCT: 40.8 % (ref 39.0–52.0)
Hemoglobin: 13.2 g/dL (ref 13.0–17.0)
Lymphocytes Relative: 15 %
Lymphs Abs: 0.9 10*3/uL (ref 0.7–4.0)
MCH: 23.4 pg — ABNORMAL LOW (ref 26.0–34.0)
MCHC: 32.4 g/dL (ref 30.0–36.0)
MCV: 72.3 fL — ABNORMAL LOW (ref 78.0–100.0)
Monocytes Absolute: 0.7 10*3/uL (ref 0.1–1.0)
Monocytes Relative: 11 %
Neutro Abs: 4.7 10*3/uL (ref 1.7–7.7)
Neutrophils Relative %: 74 %
Platelets: 153 10*3/uL (ref 150–400)
RBC: 5.64 MIL/uL (ref 4.22–5.81)
RDW: 15.9 % — ABNORMAL HIGH (ref 11.5–15.5)
WBC: 6.3 10*3/uL (ref 4.0–10.5)

## 2015-08-29 LAB — BASIC METABOLIC PANEL
Anion gap: 8 (ref 5–15)
BUN: 15 mg/dL (ref 6–20)
CO2: 25 mmol/L (ref 22–32)
Calcium: 9.5 mg/dL (ref 8.9–10.3)
Chloride: 109 mmol/L (ref 101–111)
Creatinine, Ser: 1.59 mg/dL — ABNORMAL HIGH (ref 0.61–1.24)
GFR calc Af Amer: 49 mL/min — ABNORMAL LOW (ref >60)
GFR calc non Af Amer: 42 mL/min — ABNORMAL LOW (ref >60)
Glucose, Bld: 118 mg/dL — ABNORMAL HIGH (ref 65–99)
Potassium: 4.4 mmol/L (ref 3.5–5.1)
Sodium: 142 mmol/L (ref 135–145)

## 2015-08-29 LAB — BRAIN NATRIURETIC PEPTIDE: B Natriuretic Peptide: 333.2 pg/mL — ABNORMAL HIGH (ref 0.0–100.0)

## 2015-08-29 LAB — I-STAT TROPONIN, ED: Troponin i, poc: 0.01 ng/mL (ref 0.00–0.08)

## 2015-08-29 MED ORDER — SODIUM CHLORIDE 0.9 % IV BOLUS (SEPSIS)
1000.0000 mL | Freq: Once | INTRAVENOUS | Status: AC
  Administered 2015-08-29: 1000 mL via INTRAVENOUS

## 2015-08-29 MED ORDER — METOPROLOL TARTRATE 25 MG PO TABS
12.5000 mg | ORAL_TABLET | Freq: Once | ORAL | Status: AC
  Administered 2015-08-30: 12.5 mg via ORAL
  Filled 2015-08-29: qty 1

## 2015-08-29 NOTE — Consult Note (Signed)
CARDIOLOGY CONSULT NOTE  Patient ID: Ryan [name] MRN: [id number] DOB/AGE: 71-12-1945 71 y.o.  Admit date: 08/29/2015 Primary Physician [name], MD Primary Cardiologist Dr. Tamala Julian Chief Complaint  Tachycardia  HPI:  The patient has a history of atrial fib and MVR for endocarditis.  He was at a party and had to walk up 3 flights of stairs.  He developed increased heart rate similar to previous episodes.  He felt slightly weak and dizzy but did not have presyncope.  He denis SOB, or chest pain.  He reports that his initial heart rate when EMS arrived was 150.  However, it started to slow down in the ED.  He was intermittently in NSR during this visit.  He otherwise feels well.  Past Medical History  Diagnosis Date  . Hypertension   . Atrial fibrillation ([place])   . Diabetes mellitus without complication ([place])   . Peripheral vascular disease [facility])     Past Surgical History  Procedure Laterality Date  . I&d extremity  09/14/2012    Procedure: IRRIGATION AND DEBRIDEMENT EXTREMITY;  Surgeon: Tennis Must, MD;  Location: [place];  Service: Orthopedics;  Laterality: Right;  . Extremity wire/pin removal  09/14/2012    Procedure: REMOVAL K-WIRE/PIN EXTREMITY;  Surgeon: [name], MD;  Location: [place];  Service: Orthopedics;  Laterality: Right;  Right Foot  . Tee without cardioversion  09/18/2012    Procedure: TRANSESOPHAGEAL ECHOCARDIOGRAM (TEE);  Surgeon: [name], MD;  Location: [facility] ENDOSCOPY;  Service: Cardiovascular;  Laterality: N/A;  . Mitral valve replacement  09/26/2012    Procedure: MITRAL VALVE (MV) REPLACEMENT;  Surgeon: [name], MD;  Location: [place];  Service: Open Heart Surgery;  Laterality: N/A;  . Intraoperative transesophageal echocardiogram  09/26/2012    Procedure: INTRAOPERATIVE TRANSESOPHAGEAL ECHOCARDIOGRAM;  Surgeon: [name], MD;  Location: [facility] OR;  Service: Open Heart Surgery;  Laterality: N/A;  . Coronary angiogram  09/21/2012    Procedure: CORONARY  ANGIOGRAM;  Surgeon: [name], MD;  Location: [facility] CATH LAB;  Service: Cardiovascular;;  . Right heart catheterization  09/21/2012    Procedure: RIGHT HEART CATH;  Surgeon: [name], MD;  Location: [facility] CATH LAB;  Service: Cardiovascular;;  . Abdominal aortagram N/A 10/03/2012    Procedure: ABDOMINAL Maxcine Ham;  Surgeon: [name], MD;  Location: [facility] CATH LAB;  Service: Cardiovascular;  Laterality: N/A;    Allergies  Allergen Reactions  . Geralyn Flash [Fish Allergy] Nausea And Vomiting   Prior to Admission medications   Medication Sig Start Date End Date Taking? Authorizing Provider  atorvastatin (LIPITOR) 40 MG tablet Take 1 tablet (40 mg total) by mouth daily at 6 PM. 10/06/12  Yes Timmothy Euler, MD  DIOVAN 80 MG tablet Take 80 mg by mouth daily.  10/31/12  Yes Historical Provider, MD  HYDROcodone-acetaminophen (NORCO/VICODIN) 5-325 MG per tablet Take 1 tablet by mouth every 6 (six) hours as needed for moderate pain.   Yes Historical Provider, MD  metoprolol tartrate (LOPRESSOR) 25 MG tablet Take 12.5 mg by mouth at bedtime.    Yes Historical Provider, MD  pantoprazole (PROTONIX) 40 MG tablet Take 1 tablet by mouth daily. 11/12/14  Yes Historical Provider, MD  potassium chloride SA (K-DUR,KLOR-CON) 20 MEQ tablet Take 2 tablets (40 mEq total) by mouth daily. Patient taking differently: Take 20 mEq by mouth daily.  10/06/12  Yes Timmothy Euler, MD  traMADol (ULTRAM) 50 MG tablet Take 50 mg by mouth as  needed. Take 1 tab as needed for pain as directed 03/12/14  Yes Historical Provider, MD  triamcinolone cream (KENALOG) 0.5 % Apply 1 application topically as needed (SKIN).  02/15/14  Yes Historical Provider, MD  warfarin (COUMADIN) 6 MG tablet Take 6 mg by mouth every other day , alternating with 9 mg by mouth on opposite days. 11/10/14  Yes Historical Provider, MD     (Not in a hospital admission) Family History  Problem Relation Age of Onset  . Hypertension Mother   .  Hypertension Father     Social History   Social History  . Marital Status: Legally Separated    Spouse Name: N/A  . Number of Children: N/A  . Years of Education: N/A   Occupational History  . Not on file.   Social History Main Topics  . Smoking status: Never Smoker   . Smokeless tobacco: Never Used  . Alcohol Use: No  . Drug Use: No  . Sexual Activity: Not on file   Other Topics Concern  . Not on file   Social History Narrative     ROS:  Constipation  As stated in the HPI and negative for all other systems.  Physical Exam: Blood pressure 137/84, pulse 76, temperature 98.2 F (36.8 C), temperature source Oral, resp. rate 20, SpO2 100 %.  GENERAL:  Well appearing and pleasant HEENT:  Pupils equal round and reactive, fundi not visualized, oral mucosa unremarkable NECK:  No jugular venous distention, waveform within normal limits, carotid upstroke brisk and symmetric, no bruits, no thyromegaly LYMPHATICS:  No cervical, inguinal adenopathy LUNGS:  Clear to auscultation bilaterally BACK:  No CVA tenderness CHEST:  Unremarkable HEART:  PMI not displaced or sustained,S1 and S2 within normal limits, no S3, no S4, no clicks, no rubs,  no murmurs ABD:  Flat, positive bowel sounds normal in frequency in pitch, no bruits, no rebound, no guarding, no midline pulsatile mass, no hepatomegaly, no splenomegaly EXT:  2 plus pulses throughout, no edema, no cyanosis no clubbing SKIN:  No rashes no nodules NEURO:  Cranial nerves II through XII grossly intact, motor grossly intact throughout PSYCH:  Cognitively intact, oriented to person place and time   Labs: Lab Results  Component Value Date   BUN 15 08/29/2015   Lab Results  Component Value Date   CREATININE 1.59* 08/29/2015   Lab Results  Component Value Date   NA 142 08/29/2015   K 4.4 08/29/2015   CL 109 08/29/2015   CO2 25 08/29/2015   Lab Results  Component Value Date   TROPONINI 0.83* 09/16/2012   Lab Results    Component Value Date   WBC 6.3 08/29/2015   HGB 13.2 08/29/2015   HCT 40.8 08/29/2015   MCV 72.3* 08/29/2015   PLT 153 08/29/2015    Radiology:   CXR: Patient is post median sternotomy with prosthetic mitral valve. Cardiomediastinal contours are unchanged, mild cardiomegaly. Mild hyperinflation and bronchitic change. No consolidation, pulmonary edema, pleural effusion or pneumothorax.  EKG:  SVT rate 124.  No acute ST T wave changes.  08/29/2015  ASSESSMENT AND PLAN:   ATRIAL FIB:  The patient continues to have paroxysms of atrial fib.  In the ED rate was up to 120 intermittently.  He is not feeling this.  At this point I think that he can increase his metoprolol to 25 mg bid.  With a previous history of low BP I will also decrease his Diovan to 40 mg daily  HTN:  As above.  MVR:  No suggestion of valve abnormality on exam.  Can follow as an outpatient.     SignedMinus Breeding 08/29/2015, 10:55 PM

## 2015-08-29 NOTE — ED Notes (Signed)
MD at bedside. 

## 2015-08-29 NOTE — ED Provider Notes (Signed)
CSN: [id number]     Arrival date & time 08/29/15  2035 History   First MD Initiated Contact with Patient 08/29/15 2038     Chief Complaint  Patient presents with  . Shortness of Breath  . Tachycardia     (Consider location/radiation/quality/duration/timing/severity/associated sxs/prior Treatment) HPI  Patient is a 69 year old male with past medical history significant for hypertension, diabetes, A fib/flutter (coumadin), MV replacement for endocarditis, who presents to the emergency department with shortness of breath. Reports he was at a party earlier tonight. He had to walk up 3 flights of stairs, after sitting down at his table he had shortness of breath, chest pain, nausea, diaphoresis, lightheadedness, generalized weakness. Reports left-sided chest pain, dull, nonradiating, constant. Worsened with exertion, improved at rest. Has not taken anything for the pain. Rates 2/10.  Shortness of breath has also improved with rest.  Patient reports a couple month history of progressively worsening shortness of breath with exertion. Denies increase in the amount of pillows he is using at night. No recent fever, chills, cough, congestion. No h/o DVT/PE. No recent surgeries or prolonged immobilization. No recent stress testing or cardiac catheterization.    Past Medical History  Diagnosis Date  . Hypertension   . Atrial fibrillation ([place])   . Diabetes mellitus without complication ([place])   . Peripheral vascular disease ([place])   . Endocarditis    Past Surgical History  Procedure Laterality Date  . I&d extremity  09/14/2012    Procedure: IRRIGATION AND DEBRIDEMENT EXTREMITY;  Surgeon: Tennis Must, MD;  Location: [facility];  Service: Orthopedics;  Laterality: Right;  . Extremity wire/pin removal  09/14/2012    Procedure: REMOVAL K-WIRE/PIN EXTREMITY;  Surgeon: [name], MD;  Location: [place];  Service: Orthopedics;  Laterality: Right;  Right Foot  . Tee without cardioversion  09/18/2012   Procedure: TRANSESOPHAGEAL ECHOCARDIOGRAM (TEE);  Surgeon: [name], MD;  Location: [facility] ENDOSCOPY;  Service: Cardiovascular;  Laterality: N/A;  . Mitral valve replacement  09/26/2012    Procedure: MITRAL VALVE (MV) REPLACEMENT;  Surgeon: [name], MD;  Location: [place];  Service: Open Heart Surgery;  Laterality: N/A;  . Intraoperative transesophageal echocardiogram  09/26/2012    Procedure: INTRAOPERATIVE TRANSESOPHAGEAL ECHOCARDIOGRAM;  Surgeon: [name], MD;  Location: [facility] OR;  Service: Open Heart Surgery;  Laterality: N/A;  . Coronary angiogram  09/21/2012    Procedure: CORONARY ANGIOGRAM;  Surgeon: [name], MD;  Location: [facility] CATH LAB;  Service: Cardiovascular;;  . Right heart catheterization  09/21/2012    Procedure: RIGHT HEART CATH;  Surgeon: [name], MD;  Location: [facility] CATH LAB;  Service: Cardiovascular;;  . Abdominal aortagram N/A 10/03/2012    Procedure: ABDOMINAL Maxcine Ham;  Surgeon: [name], MD;  Location: [facility] CATH LAB;  Service: Cardiovascular;  Laterality: N/A;   Family History  Problem Relation Age of Onset  . Hypertension Mother   . Hypertension Father    Social History  Substance Use Topics  . Smoking status: Never Smoker   . Smokeless tobacco: Never Used  . Alcohol Use: No    Review of Systems  Constitutional: Positive for diaphoresis. Negative for fever and appetite change.  HENT: Negative for congestion.   Eyes: Negative for visual disturbance.  Respiratory: Positive for chest tightness and shortness of breath. Negative for wheezing.   Cardiovascular: Positive for chest pain and palpitations.  Gastrointestinal: Positive for nausea. Negative for vomiting, abdominal pain and blood in stool.  Genitourinary: Negative for  dysuria, frequency and decreased urine volume.  Musculoskeletal: Negative for back pain.  Skin: Negative for rash.  Neurological: Positive for light-headedness. Negative for dizziness, seizures, syncope, weakness and  headaches.  Psychiatric/Behavioral: Negative for behavioral problems.  All other systems reviewed and are negative.     Allergies  Tuna  Home Medications   Prior to Admission medications   Medication Sig Start Date End Date Taking? Authorizing Provider  atorvastatin (LIPITOR) 40 MG tablet Take 1 tablet (40 mg total) by mouth daily at 6 PM. 10/06/12  Yes Timmothy Euler, MD  DIOVAN 80 MG tablet Take 80 mg by mouth daily.  10/31/12  Yes Historical Provider, MD  HYDROcodone-acetaminophen (NORCO/VICODIN) 5-325 MG per tablet Take 1 tablet by mouth every 6 (six) hours as needed for moderate pain.   Yes Historical Provider, MD  metoprolol tartrate (LOPRESSOR) 25 MG tablet Take 12.5 mg by mouth at bedtime.    Yes Historical Provider, MD  pantoprazole (PROTONIX) 40 MG tablet Take 1 tablet by mouth daily. 11/12/14  Yes Historical Provider, MD  potassium chloride SA (K-DUR,KLOR-CON) 20 MEQ tablet Take 2 tablets (40 mEq total) by mouth daily. Patient taking differently: Take 20 mEq by mouth daily.  10/06/12  Yes Timmothy Euler, MD  traMADol (ULTRAM) 50 MG tablet Take 50 mg by mouth as needed. Take 1 tab as needed for pain as directed 03/12/14  Yes Historical Provider, MD  triamcinolone cream (KENALOG) 0.5 % Apply 1 application topically as needed (SKIN).  02/15/14  Yes Historical Provider, MD  warfarin (COUMADIN) 6 MG tablet Take 6 mg by mouth every other day , alternating with 9 mg by mouth on opposite days. 11/10/14  Yes Historical Provider, MD   BP 148/80 mmHg  Pulse 78  Temp(Src) 97.8 F (36.6 C) (Oral)  Resp 18  Ht 6' (1.829 m)  Wt 78.155 kg  BMI 23.36 kg/m2  SpO2 98% Physical Exam  Constitutional: He is oriented to person, place, and time. He appears well-developed and well-nourished. No distress.  HENT:  Head: Atraumatic.  Mouth/Throat: Oropharynx is clear and moist.  Eyes: Conjunctivae and EOM are normal. Pupils are equal, round, and reactive to light.  Neck: Normal range of motion.  Neck supple. No JVD present. No tracheal deviation present.  Cardiovascular: Normal rate, regular rhythm and intact distal pulses.   Murmur heard. Pulmonary/Chest: Effort normal and breath sounds normal. No respiratory distress. He has no wheezes. He exhibits no tenderness.  Abdominal: Soft. Bowel sounds are normal. He exhibits no distension. There is no tenderness.  Musculoskeletal: Normal range of motion. He exhibits no edema.  Neurological: He is alert and oriented to person, place, and time.  Skin: Skin is warm.  Psychiatric: He has a normal mood and affect.  Nursing note and vitals reviewed.   ED Course  Procedures (including critical care time) Labs Review Labs Reviewed  BASIC METABOLIC PANEL - Abnormal; Notable for the following:    Glucose, Bld 118 (*)    Creatinine, Ser 1.59 (*)    GFR calc non Af Amer 42 (*)    GFR calc Af Amer 49 (*)    All other components within normal limits  CBC WITH DIFFERENTIAL/PLATELET - Abnormal; Notable for the following:    MCV 72.3 (*)    MCH 23.4 (*)    RDW 15.9 (*)    All other components within normal limits  BRAIN NATRIURETIC PEPTIDE - Abnormal; Notable for the following:    B Natriuretic Peptide 333.2 (*)  All other components within normal limits  CREATININE, URINE, RANDOM  SODIUM, URINE, RANDOM  BASIC METABOLIC PANEL  MAGNESIUM  TSH  URINALYSIS, ROUTINE W REFLEX MICROSCOPIC (NOT AT [facility])  PROTIME-INR  I-STAT TROPOININ, ED  Randolm Idol, ED    Imaging Review Dg Chest 2 View  08/29/2015  CLINICAL DATA:  Shortness of breath.  Tachycardia. EXAM: CHEST  2 VIEW COMPARISON:  11/07/2012 FINDINGS: Patient is post median sternotomy with prosthetic mitral valve. Cardiomediastinal contours are unchanged, mild cardiomegaly. Mild hyperinflation and bronchitic change. No consolidation, pulmonary edema, pleural effusion or pneumothorax. IMPRESSION: Stable mild cardiomegaly.  No acute pulmonary process. Electronically Signed   By: Jeb Levering M.D.   On: 08/29/2015 21:27   I have personally reviewed and evaluated these images and lab results as part of my medical decision-making.   EKG Interpretation   Date/Time:  Friday August 29 2015 20:40:05 EST Ventricular Rate:  83 PR Interval:  173 QRS Duration: 98 QT Interval:  396 QTC Calculation: 465 R Axis:   81 Text Interpretation:  Sinus rhythm Ventricular premature complex  Borderline right axis deviation Borderline repolarization abnormality  borderline ST depression V5-V6 Confirmed by LITTLE MD, [name] [phone]) on  08/29/2015 10:29:52 PM      MDM   Final diagnoses:  None   Patient is a 69 year old male with past medical history significant for hypertension, diabetes, A fib/flutter (coumadin), MV replacement for endocarditis, who presents to the emergency department with shortness of breath, chest pain, palpitations. When EMS arrived, patient with tachycardia, rate 150s, hypotension BP 90/40s. Patient was given 250 mL normal saline prior to arrival. On arrival no acute distress, not ill appearing. Afebrile, HR 80s, BP 140/80s, SpO2 100% on RA.   Differential diagnosis includes A fib/flutter, ACS, CHF exacerbation, pneumonia, dissection, pericarditis/myocaritis.  Given aspirin 325 mg.  EKG showed normal sinus rhythm, rate 83, normal intervals, no chamber enlargement, borderline ST depression in V5-V6, no change from prior. No leukocytosis. Hemoglobin 13.2. Found to have acute kidney injury, Cr 1.59 (baseline 0.6), no significant electrolyte abnormalities. Chest x-ray showed no acute findings. BNP 300s. Initial troponin 0.01. Given IV fluid bolus given his acute kidney injury. Doubt dissection, no sharp sudden onset chest pain, intact distal pulses, no mediastinal widening on chest x-ray. No signs of pericarditis/myocarditis on EKG, does not fit clinical picture.  Patient had intermittent A. fib/flutter in the emergency department. Repeat EKG showed A. fib, rate 124, no  signs of ischemia. Cardiology consulted, Dr. Percival Spanish, evaluated the patient in the emergency department and recommended increasing his metoprolol to 25 mg daily from 12.5. Patient will be admitted to internal medicine for AKI and serial troponin. Patient stable for the floor. Will admit to telemetry floor bed.      Nathaniel Man, MD 08/30/15 [id number]  Sharlett Iles, MD 09/03/15 [phone]

## 2015-08-29 NOTE — ED Notes (Signed)
Per EMS, got SOB and tachy while at a party. EMS noted HR in 140s and positive orthostatics on scene. Pt converted to HR 88 enroute. Hx HTN, heart valve replacement 2013

## 2015-08-30 ENCOUNTER — Encounter (HOSPITAL_COMMUNITY): Payer: Self-pay | Admitting: Family Medicine

## 2015-08-30 DIAGNOSIS — I480 Paroxysmal atrial fibrillation: Secondary | ICD-10-CM

## 2015-08-30 DIAGNOSIS — I110 Hypertensive heart disease with heart failure: Secondary | ICD-10-CM | POA: Diagnosis present

## 2015-08-30 DIAGNOSIS — I739 Peripheral vascular disease, unspecified: Secondary | ICD-10-CM | POA: Diagnosis present

## 2015-08-30 DIAGNOSIS — I4891 Unspecified atrial fibrillation: Secondary | ICD-10-CM | POA: Diagnosis not present

## 2015-08-30 DIAGNOSIS — N179 Acute kidney failure, unspecified: Secondary | ICD-10-CM | POA: Diagnosis present

## 2015-08-30 DIAGNOSIS — Z953 Presence of xenogenic heart valve: Secondary | ICD-10-CM | POA: Diagnosis not present

## 2015-08-30 DIAGNOSIS — E119 Type 2 diabetes mellitus without complications: Secondary | ICD-10-CM | POA: Diagnosis present

## 2015-08-30 DIAGNOSIS — Z7901 Long term (current) use of anticoagulants: Secondary | ICD-10-CM | POA: Diagnosis not present

## 2015-08-30 DIAGNOSIS — Z8249 Family history of ischemic heart disease and other diseases of the circulatory system: Secondary | ICD-10-CM | POA: Diagnosis not present

## 2015-08-30 DIAGNOSIS — I4892 Unspecified atrial flutter: Secondary | ICD-10-CM | POA: Diagnosis present

## 2015-08-30 DIAGNOSIS — I5032 Chronic diastolic (congestive) heart failure: Secondary | ICD-10-CM | POA: Diagnosis present

## 2015-08-30 LAB — BASIC METABOLIC PANEL
Anion gap: 5 (ref 5–15)
BUN: 15 mg/dL (ref 6–20)
CO2: 27 mmol/L (ref 22–32)
Calcium: 8.8 mg/dL — ABNORMAL LOW (ref 8.9–10.3)
Chloride: 112 mmol/L — ABNORMAL HIGH (ref 101–111)
Creatinine, Ser: 1.35 mg/dL — ABNORMAL HIGH (ref 0.61–1.24)
GFR calc Af Amer: 59 mL/min — ABNORMAL LOW (ref >60)
GFR calc non Af Amer: 51 mL/min — ABNORMAL LOW (ref >60)
Glucose, Bld: 122 mg/dL — ABNORMAL HIGH (ref 65–99)
Potassium: 4.1 mmol/L (ref 3.5–5.1)
Sodium: 144 mmol/L (ref 135–145)

## 2015-08-30 LAB — MAGNESIUM: Magnesium: 2 mg/dL (ref 1.7–2.4)

## 2015-08-30 LAB — URINALYSIS, ROUTINE W REFLEX MICROSCOPIC
Bilirubin Urine: NEGATIVE
Glucose, UA: NEGATIVE mg/dL
Hgb urine dipstick: NEGATIVE
Ketones, ur: 15 mg/dL — AB
Nitrite: NEGATIVE
Protein, ur: 30 mg/dL — AB
Specific Gravity, Urine: 1.022 (ref 1.005–1.030)
pH: 5.5 (ref 5.0–8.0)

## 2015-08-30 LAB — PROTIME-INR
INR: 2.4 — ABNORMAL HIGH (ref 0.00–1.49)
Prothrombin Time: 25.8 seconds — ABNORMAL HIGH (ref 11.6–15.2)

## 2015-08-30 LAB — URINE MICROSCOPIC-ADD ON: RBC / HPF: NONE SEEN RBC/hpf (ref 0–5)

## 2015-08-30 LAB — TSH: TSH: 1.779 u[IU]/mL (ref 0.350–4.500)

## 2015-08-30 LAB — SODIUM, URINE, RANDOM: Sodium, Ur: 100 mmol/L

## 2015-08-30 LAB — CREATININE, URINE, RANDOM: Creatinine, Urine: 234.48 mg/dL

## 2015-08-30 LAB — I-STAT TROPONIN, ED: Troponin i, poc: 0.08 ng/mL (ref 0.00–0.08)

## 2015-08-30 MED ORDER — METOPROLOL TARTRATE 25 MG PO TABS: 25.0000 mg | ORAL_TABLET | Freq: Two times a day (BID) | ORAL | Status: DC

## 2015-08-30 MED ORDER — METOPROLOL TARTRATE 12.5 MG HALF TABLET: 12.5000 mg | ORAL_TABLET | Freq: Two times a day (BID) | ORAL | Status: DC

## 2015-08-30 MED ORDER — PANTOPRAZOLE SODIUM 40 MG PO TBEC
40.0000 mg | DELAYED_RELEASE_TABLET | Freq: Every day | ORAL | Status: DC
  Administered 2015-08-30: 40 mg via ORAL
  Filled 2015-08-30: qty 1

## 2015-08-30 MED ORDER — METOPROLOL TARTRATE 25 MG PO TABS
25.0000 mg | ORAL_TABLET | Freq: Two times a day (BID) | ORAL | Status: DC
  Filled 2015-08-30: qty 1

## 2015-08-30 MED ORDER — VALSARTAN 40 MG PO TABS: 40.0000 mg | ORAL_TABLET | Freq: Every day | ORAL | Status: DC

## 2015-08-30 MED ORDER — ATORVASTATIN CALCIUM 40 MG PO TABS: 40.0000 mg | ORAL_TABLET | Freq: Every day | ORAL | Status: DC

## 2015-08-30 MED ORDER — SODIUM CHLORIDE 0.9 % IV BOLUS (SEPSIS)
500.0000 mL | Freq: Once | INTRAVENOUS | Status: AC
  Administered 2015-08-30: 500 mL via INTRAVENOUS

## 2015-08-30 MED ORDER — WARFARIN - PHARMACIST DOSING INPATIENT
Freq: Every day | Status: DC
Start: 2015-08-30 — End: 2015-08-30

## 2015-08-30 MED ORDER — METOPROLOL TARTRATE 1 MG/ML IV SOLN: 5.0000 mg | INTRAVENOUS | Status: DC | PRN

## 2015-08-30 NOTE — Progress Notes (Signed)
Pt. Discharged home, IV and tele removed. Discharge instructions given questions answered.

## 2015-08-30 NOTE — Progress Notes (Signed)
   08/30/15 0116  Vitals  Temp 97.8 F (36.6 C)  Temp Source Oral  BP (!) 148/80 mmHg  BP Location Right Arm  Pulse Rate 78  Pulse Rate Source Dinamap  Resp 18  Oxygen Therapy  SpO2 98 %  O2 Device Room Air  Height and Weight  Height 6' (1.829 m)  Weight 78.155 kg (172 lb 4.8 oz) (scale a)  Type of Scale Used Standing  BSA (Calculated - sq m) 1.99 sq meters  BMI (Calculated) 23.4  Weight in (lb) to have BMI = 25 183.9  Admitted pt to rm 3E04 from ED,pt alert and oriented, denied pain at this time, oriented to room, call bell placed within reach, admission assessment done, orders carried out. Will continue to monitor.

## 2015-08-30 NOTE — Progress Notes (Signed)
    Subjective:    No complaints  Objective:   Temp:  [97.8 F (36.6 C)-98.2 F (36.8 C)] 97.8 F (36.6 C) (12/10 0437) Pulse Rate:  [62-124] 66 (12/10 0437) Resp:  [14-20] 18 (12/10 0437) BP: (120-148)/(66-85) 139/66 mmHg (12/10 0437) SpO2:  [98 %-100 %] 98 % (12/10 0437) Weight:  [172 lb 4.8 oz (78.155 kg)] 172 lb 4.8 oz (78.155 kg) (12/10 0116) Last BM Date: 08/29/15  Filed Weights   08/30/15 0116  Weight: 172 lb 4.8 oz (78.155 kg)    Intake/Output Summary (Last 24 hours) at 08/30/15 1005 Last data filed at 08/30/15 0647  Gross per 24 hour  Intake   2040 ml  Output    100 ml  Net   1940 ml    Telemetry: NSR  Exam:  General: NAD  HEENT: sclera clear  Resp: CTAB  Cardiac: RRR, no m/r/g, no jvd  GI: abdomen soft, NT, ND  MSK: no LE edema  Neuro: no focal deficits  Psych: appropriate affect  Lab Results:  Basic Metabolic Panel:  Recent Labs Lab 08/29/15 2107 08/30/15 0234  NA 142 144  K 4.4 4.1  CL 109 112*  CO2 25 27  GLUCOSE 118* 122*  BUN 15 15  CREATININE 1.59* 1.35*  CALCIUM 9.5 8.8*  MG  --  2.0    Liver Function Tests: No results for input(s): AST, ALT, ALKPHOS, BILITOT, PROT, ALBUMIN in the last 168 hours.  CBC:  Recent Labs Lab 08/29/15 2107  WBC 6.3  HGB 13.2  HCT 40.8  MCV 72.3*  PLT 153    Cardiac Enzymes: No results for input(s): CKTOTAL, CKMB, CKMBINDEX, TROPONINI in the last 168 hours.  BNP: No results for input(s): PROBNP in the last 8760 hours.  Coagulation:  Recent Labs Lab 08/30/15 0234  INR 2.40*    ECG:   Medications:   Scheduled Medications: . atorvastatin  40 mg Oral q1800  . [START ON 08/31/2015] metoprolol tartrate  12.5 mg Oral BID  . pantoprazole  40 mg Oral Daily  . Warfarin - Pharmacist Dosing Inpatient   Does not apply q1800     Infusions:     PRN Medications:  metoprolol     Assessment/Plan    1. PAF - reported history of PAF - EKG 08/29/15 shows narrow complex  regular tachycardia, look to be p-waves with long PR interval. There are no tele strips from ER available.  - started on lopressor 12.5mg  bid, first dose this morning. From home meds appears he had previously just been taking at night time.  - he is on coumadin with therapeutic INR - normal K, Mg, TSH   2. History of MVR bioprothetic  3. AKI  - per primary team    Will sign off from cardiac stanpdpoint. Rates controlled with lopressor.     Carlyle Dolly, M.D

## 2015-08-30 NOTE — Progress Notes (Signed)
ANTICOAGULATION CONSULT NOTE - Initial Consult  Pharmacy Consult for Coumadin Indication: atrial fibrillation  Allergies  Allergen Reactions  . Tuna [Fish Allergy] Nausea And Vomiting    Patient Measurements: Height: 6' (182.9 cm) Weight: 172 lb 4.8 oz (78.155 kg) (scale a) IBW/kg (Calculated) : 77.6  Vital Signs: Temp: 97.8 F (36.6 C) (12/10 0116) Temp Source: Oral (12/10 0116) BP: 148/80 mmHg (12/10 0116) Pulse Rate: 78 (12/10 0116)  Labs:  Recent Labs  08/29/15 2107  HGB 13.2  HCT 40.8  PLT 153  CREATININE 1.59*    Estimated Creatinine Clearance: 46.8 mL/min (by C-G formula based on Cr of 1.59).   Medical History: Past Medical History  Diagnosis Date  . Hypertension   . Atrial fibrillation ([place])   . Diabetes mellitus without complication ([place])   . Peripheral vascular disease ([place])   . Endocarditis     Medications:  Prescriptions prior to admission  Medication Sig Dispense Refill Last Dose  . atorvastatin (LIPITOR) 40 MG tablet Take 1 tablet (40 mg total) by mouth daily at 6 PM. 30 tablet 5 08/28/2015 at Unknown time  . DIOVAN 80 MG tablet Take 80 mg by mouth daily.    08/29/2015 at Unknown time  . HYDROcodone-acetaminophen (NORCO/VICODIN) 5-325 MG per tablet Take 1 tablet by mouth every 6 (six) hours as needed for moderate pain.   1 week  . metoprolol tartrate (LOPRESSOR) 25 MG tablet Take 12.5 mg by mouth at bedtime.    08/28/2015 at 900 pm  . pantoprazole (PROTONIX) 40 MG tablet Take 1 tablet by mouth daily.   08/29/2015 at Unknown time  . potassium chloride SA (K-DUR,KLOR-CON) 20 MEQ tablet Take 2 tablets (40 mEq total) by mouth daily. (Patient taking differently: Take 20 mEq by mouth daily. ) 30 tablet 0 08/29/2015 at Unknown time  . traMADol (ULTRAM) 50 MG tablet Take 50 mg by mouth as needed. Take 1 tab as needed for pain as directed   08/25/2015  . triamcinolone cream (KENALOG) 0.5 % Apply 1 application topically as needed (SKIN).    08/29/2015 at Unknown  time  . warfarin (COUMADIN) 6 MG tablet Take 6 mg by mouth every other day , alternating with 9 mg by mouth on opposite days.  1 08/29/2015 at 500 pm    Assessment: 69 y.o. male admitted with tachycardia/Afib to continue Coumadin Goal of Therapy:  INR 2-3 Monitor platelets by anticoagulation protocol: Yes   Plan:  F/U daily PT/INR  Callista Hoh, Bronson Curb 08/30/2015,1:31 AM

## 2015-08-30 NOTE — ED Notes (Signed)
MD at bedside. 

## 2015-08-30 NOTE — Discharge Summary (Signed)
Physician Discharge Summary  ANFRENEE MCCLARIN [id number] DOB: [birth year] DOA: 08/29/2015  PCP: [name], MD  Admit date: 08/29/2015 Discharge date: 08/30/2015   Recommendations for Outpatient Follow-up:  1. Monitor BMET   Discharge Diagnoses:  Principal Problem:   AKI (acute kidney injury) ([place]) Active Problems:   Atrial fibrillation with RVR   Status post mitral valve replacement with bioprosthetic valve   Essential hypertension   Discharge Condition: stable  Diet recommendation: heart healthy  Filed Weights   08/30/15 0116  Weight: 78.155 kg (172 lb 4.8 oz)    History of present illness:  69 y.o. male with a past medical history significant for A. 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  The patient was going to a party at his pastor's house tonight when he climbed 2 flights of stairs. After this exertion he developed palpitations, left-sided chest discomfort, diaphoresis, lightheadedness, and generalized weakness. EMS arrived and found the patient with blood pressure 90/40 administer normal saline bolus which brought his blood pressure 140/80 and resolved his tachycardia.  In the ED, the patient had newly elevated serum creatinine and intermittent atrial tachycardia. Radiology were consulted who recommended titration of metoprolol. TRH was asked to admit for AKI and A. fib with RVR.  Hospital Course:  Cardiology consulted, recommend changing to metoprolol to 25 mg bid (had been on 12.5 mg daily), and decrease valsartan to 40 mg to avoid hypotension.  Hydrated overnight, and creatinine decreasing. Heart rate has been normal. Feels well and is stable for discharge  Procedures:  none  Consultations:  cardiology  Discharge Exam: Filed Vitals:   08/30/15 0116 08/30/15 0437  BP: 148/80 139/66  Pulse: 78 66  Temp: 97.8 F (36.6 C) 97.8 F  (36.6 C)  Resp: 18 18    General: a and o. comfortable Cardiovascular: Irreg irreg Respiratory: CTA  Discharge Instructions   Discharge Instructions    Diet - low sodium heart healthy    Complete by:  As directed      Discharge instructions    Complete by:  As directed   Change metoprolol to 25 mg twice daily (whole tablet). Change valsartan to 40 mg daily     Increase activity slowly    Complete by:  As directed           Current Discharge Medication List    CONTINUE these medications which have CHANGED   Details  metoprolol tartrate (LOPRESSOR) 25 MG tablet Take 1 tablet (25 mg total) by mouth 2 (two) times daily. Qty: 60 tablet, Refills: 0    valsartan (DIOVAN) 40 MG tablet Take 1 tablet (40 mg total) by mouth daily. Qty: 30 tablet, Refills: 0      CONTINUE these medications which have NOT CHANGED   Details  atorvastatin (LIPITOR) 40 MG tablet Take 1 tablet (40 mg total) by mouth daily at 6 PM. Qty: 30 tablet, Refills: 5    HYDROcodone-acetaminophen (NORCO/VICODIN) 5-325 MG per tablet Take 1 tablet by mouth every 6 (six) hours as needed for moderate pain.    pantoprazole (PROTONIX) 40 MG tablet Take 1 tablet by mouth daily.    potassium chloride SA (K-DUR,KLOR-CON) 20 MEQ tablet Take 2 tablets (40 mEq total) by mouth daily. Qty: 30 tablet, Refills: 0    traMADol (ULTRAM) 50 MG tablet Take 50 mg by mouth as needed. Take 1 tab as needed for pain as directed  triamcinolone cream (KENALOG) 0.5 % Apply 1 application topically as needed (SKIN).     warfarin (COUMADIN) 6 MG tablet Take 6 mg by mouth every other day , alternating with 9 mg by mouth on opposite days. Refills: 1       Allergies  Allergen Reactions  . Geralyn Flash [Fish Allergy] Nausea And Vomiting   Follow-up Information    Follow up with [facility], [place].   Specialty:  Internal Medicine   Why:  as previously scheduled, to check blood pressure, heart rate and kidney function   Contact information:    [address], [place] [address] [phone]        The results of significant diagnostics from this hospitalization (including imaging, microbiology, ancillary and laboratory) are listed below for reference.    Significant Diagnostic Studies: Dg Chest 2 View  08/29/2015  CLINICAL DATA:  Shortness of breath.  Tachycardia. EXAM: CHEST  2 VIEW COMPARISON:  11/07/2012 FINDINGS: Patient is post median sternotomy with prosthetic mitral valve. Cardiomediastinal contours are unchanged, mild cardiomegaly. Mild hyperinflation and bronchitic change. No consolidation, pulmonary edema, pleural effusion or pneumothorax. IMPRESSION: Stable mild cardiomegaly.  No acute pulmonary process. Electronically Signed   By: Jeb Levering M.D.   On: 08/29/2015 21:27    Microbiology: No results found for this or any previous visit (from the past 240 hour(s)).   Labs: Basic Metabolic Panel:  Recent Labs Lab 08/29/15 2107 08/30/15 0234  NA 142 144  K 4.4 4.1  CL 109 112*  CO2 25 27  GLUCOSE 118* 122*  BUN 15 15  CREATININE 1.59* 1.35*  CALCIUM 9.5 8.8*  MG  --  2.0   Liver Function Tests: No results for input(s): AST, ALT, ALKPHOS, BILITOT, PROT, ALBUMIN in the last 168 hours. No results for input(s): LIPASE, AMYLASE in the last 168 hours. No results for input(s): AMMONIA in the last 168 hours. CBC:  Recent Labs Lab 08/29/15 2107  WBC 6.3  NEUTROABS 4.7  HGB 13.2  HCT 40.8  MCV 72.3*  PLT 153   Cardiac Enzymes: No results for input(s): CKTOTAL, CKMB, CKMBINDEX, TROPONINI in the last 168 hours. BNP: BNP (last 3 results)  Recent Labs  08/29/15 2107  BNP 333.2*    ProBNP (last 3 results) No results for input(s): PROBNP in the last 8760 hours.  CBG: No results for input(s): GLUCAP in the last 168 hours.     SignedDelfina Redwood  [facility] 08/30/2015, 10:34 AM

## 2015-08-30 NOTE — H&P (Signed)
History and Physical  Patient Name: Ryan [name]     [id number]    DOB: 01-18-1946    DOA: 08/29/2015 Referring physician: Nathaniel Man, MD PCP: [name], MD      Chief Complaint: Palpitations  HPI: Ryan [name] is a 69 y.o. male with a past medical history significant for A. 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  The patient was going to a party at his pastor's house tonight when he climbed 2 flights of stairs. After this exertion he developed palpitations, left-sided chest discomfort, diaphoresis, lightheadedness, and generalized weakness.  EMS arrived and found the patient with blood pressure 90/40 administer normal saline bolus which brought his blood pressure 140/80 and resolved his tachycardia.  In the ED, the patient had newly elevated serum creatinine and intermittent atrial tachycardia. Radiology were consulted who recommended titration of metoprolol. TRH was asked to admit for AKI and A. fib with RVR.     Review of Systems:  Pt complains of intermittent palpitations and tachycardia, left-sided chest discomfort now resolved, diaphoresis, lightheadedness, generalized weakness is not resolved, dark urine. All other systems negative except as just noted or noted in the history of present illness.  Allergies  Allergen Reactions  . Geralyn Flash [Fish Allergy] Nausea And Vomiting    Prior to Admission medications   Medication Sig Start Date End Date Taking? Authorizing Provider  atorvastatin (LIPITOR) 40 MG tablet Take 1 tablet (40 mg total) by mouth daily at 6 PM. 10/06/12  Yes Timmothy Euler, MD  DIOVAN 80 MG tablet Take 80 mg by mouth daily.  10/31/12  Yes Historical Provider, MD  HYDROcodone-acetaminophen (NORCO/VICODIN) 5-325 MG per tablet Take 1 tablet by mouth every 6 (six) hours as needed for moderate pain.   Yes Historical Provider, MD    metoprolol tartrate (LOPRESSOR) 25 MG tablet Take 12.5 mg by mouth at bedtime.    Yes Historical Provider, MD  pantoprazole (PROTONIX) 40 MG tablet Take 1 tablet by mouth daily. 11/12/14  Yes Historical Provider, MD  potassium chloride SA (K-DUR,KLOR-CON) 20 MEQ tablet Take 2 tablets (40 mEq total) by mouth daily. Patient taking differently: Take 20 mEq by mouth daily.  10/06/12  Yes Timmothy Euler, MD  traMADol (ULTRAM) 50 MG tablet Take 50 mg by mouth as needed. Take 1 tab as needed for pain as directed 03/12/14  Yes Historical Provider, MD  triamcinolone cream (KENALOG) 0.5 % Apply 1 application topically as needed (SKIN).  02/15/14  Yes Historical Provider, MD  warfarin (COUMADIN) 6 MG tablet Take 6 mg by mouth every other day , alternating with 9 mg by mouth on opposite days. 11/10/14  Yes Historical Provider, MD    Past Medical History  Diagnosis Date  . Hypertension   . Atrial fibrillation ([place])   . Diabetes mellitus without complication ([place])   . Peripheral vascular disease ([place])   . Endocarditis     Past Surgical History  Procedure Laterality Date  . I&d extremity  09/14/2012    Procedure: IRRIGATION AND DEBRIDEMENT EXTREMITY;  Surgeon: Tennis Must, MD;  Location: [place];  Service: Orthopedics;  Laterality: Right;  . Extremity wire/pin removal  09/14/2012    Procedure: REMOVAL K-WIRE/PIN EXTREMITY;  Surgeon: [name], MD;  Location: [place];  Service: Orthopedics;  Laterality: Right;  Right Foot  . Tee without cardioversion  09/18/2012    Procedure: TRANSESOPHAGEAL ECHOCARDIOGRAM (  TEE);  Surgeon: [name], MD;  Location: [facility] ENDOSCOPY;  Service: Cardiovascular;  Laterality: N/A;  . Mitral valve replacement  09/26/2012    Procedure: MITRAL VALVE (MV) REPLACEMENT;  Surgeon: [name], MD;  Location: [place];  Service: Open Heart Surgery;  Laterality: N/A;  . Intraoperative transesophageal echocardiogram  09/26/2012    Procedure: INTRAOPERATIVE TRANSESOPHAGEAL ECHOCARDIOGRAM;   Surgeon: [name], MD;  Location: [facility] OR;  Service: Open Heart Surgery;  Laterality: N/A;  . Coronary angiogram  09/21/2012    Procedure: CORONARY ANGIOGRAM;  Surgeon: [name], MD;  Location: [facility] CATH LAB;  Service: Cardiovascular;;  . Right heart catheterization  09/21/2012    Procedure: RIGHT HEART CATH;  Surgeon: [name], MD;  Location: [facility] CATH LAB;  Service: Cardiovascular;;  . Abdominal aortagram N/A 10/03/2012    Procedure: ABDOMINAL Maxcine Ham;  Surgeon: [name], MD;  Location: [facility] CATH LAB;  Service: Cardiovascular;  Laterality: N/A;    Family history: family history includes Hypertension in his father and mother.  Family history of arrhythmia.  Social History: Patient lives with his daughter and granddaughter. He is able to relate without assistive devices and still drives. He is a Air cabin crew. He lives in [place]. He worked for many years for [employer] He  reports that he has never smoked. He has never used smokeless tobacco. He reports that he does not drink alcohol or use illicit drugs.     Physical Exam: BP 123/81 mmHg  Pulse 123  Temp(Src) 98.2 F (36.8 C) (Oral)  Resp 19  SpO2 100% General appearance: Well-developed, adult male, alert and in no acute distress.   Eyes: Anicteric, lids and lashes normal.     ENT: No nasal deformity, discharge, or epistaxis.  OP moist without lesions.  Dentures. Lymph: No cervical lymphadenopathy. Skin: Warm and dry.   Cardiac: Tachycardic, regular, nl S1-S2, there is a systolic murmur in the axilla.  Capillary refill is brisk.  JVP normal.  No LE edema.  Radial pulses 2+ and symmetric. Respiratory: Normal respiratory rate and rhythm.  CTAB without rales or wheezes. Abdomen: Abdomen soft without rigidity.  No TTP. No ascites, distension.   MSK: No deformities or effusions. Neuro: Sensorium intact and responding to questions, attention normal.  Speech is fluent.  Moves all extremities equally and with  normal coordination.    Psych: Behavior appropriate.  Affect normal.  No evidence of aural or visual hallucinations or delusions.       Labs on Admission:  The metabolic panel shows serum creatinine 1.6 mg/dL from a previous of 0.66 mg/dL 2 years ago. The BNP is 333 pg per mL. The troponin is negative. The complete blood count shows microcytosis without anemia, no leukocytosis or thrombocytopenia.   Radiological Exams on Admission: Personally reviewed: Dg Chest 2 View  08/29/2015  CLINICAL DATA:  Shortness of breath.  Tachycardia. EXAM: CHEST  2 VIEW COMPARISON:  11/07/2012 FINDINGS: Patient is post median sternotomy with prosthetic mitral valve. Cardiomediastinal contours are unchanged, mild cardiomegaly. Mild hyperinflation and bronchitic change. No consolidation, pulmonary edema, pleural effusion or pneumothorax. IMPRESSION: Stable mild cardiomegaly.  No acute pulmonary process. Electronically Signed   By: Jeb Levering M.D.   On: 08/29/2015 21:27    EKG: Independently reviewed. Sinus with a rate of 83, and no ST changes. Later repeat ECG shows narrow complex tachycardia, rate 124, ST depressions in inferior and lateral leads.    Assessment/Plan 1. AKI:  This  is new.  The patient's last documented SCr is 2 years ago at 0.66 mg/dL.  He reports this fall that his PCP notified him of worsening renal function, asked him to stop all NSAIDs or OTC analgesics, and rechecked his renal function and patient thought it was normal.  These records are not available.  I presume this is an acute renal injury.  -Urine electrolytes -Additional fluid bolus now -Repeat BMP   2. Afib with RVR:  This is new.  CHADS-2Vasc 5.   -Continue warfarin -Increase metoprolol tartrate to 12.5 mg bid -Additional metoprolol IV overnight PRN for HR > 130 bpm  3. HTN:  Normotensive at ER arrival. -Hold valsartan given AKI -Restart at 40 mg daily -Metoprolol as above  4. NIDDM:  Diet-controlled.   Euglycemic at admission. -Institute sliding scale if hyperglycemic on BMP tomorrow AM      DVT PPx: Warfarin Diet: Cardiac Consultants: Cardiology Code Status: Full Family Communication: Daughter, present at bedside  Medical decision making: What exists of the patient's previous chart was reviewed in depth and the case was discussed with Dr. Jori Moll. Patient seen 12:51 AM on 08/30/2015.  Disposition Plan:  Admit for AKI. Studies, fluids and trend BMP.  Also rate control of Afib.  Expect 2-3 days admission.      Edwin Dada [facility] Pager [phone]

## 2015-09-03 ENCOUNTER — Telehealth: Payer: Self-pay | Admitting: Interventional Cardiology

## 2015-09-03 DIAGNOSIS — I4891 Unspecified atrial fibrillation: Secondary | ICD-10-CM

## 2015-09-03 NOTE — Telephone Encounter (Signed)
Pt sts that he is doing well and has not had any reoccurrence of Afib since being d/c from [facility]. Pt sts that he was instructed to f/u with his pcp and he has an appt with him on Fri 12/16. Pt sts that he did want to have Dr.Smith updated on the changes that were made. Adv pt I will fwd Dr.Smith a message to review pt recent hospital stay and changes, I will call him back with Dr.Smith's response. Pt agreeable with plan and verbalized understanding.

## 2015-09-03 NOTE — Telephone Encounter (Signed)
New Message  Pt calling to speak w/ RN concerning his recent hospital stay- no cardiology f/u per AVS- but pt wanted to speak w/ Rn about some changes the hosp made. Please call back and discuss.

## 2015-09-04 NOTE — Telephone Encounter (Signed)
Pt aware of Dr.Smith's response and recommendation. Pt has a post hospital f/u with Margaret Pyle on 12/27. He would like to have his monitor appt the same day. Adv pt I will f/u a message to pcc to schedule

## 2015-09-04 NOTE — Telephone Encounter (Signed)
The changes made during the hospital stay are noted. He needs to contact me if there is persistent increase in heart rate. He needs to wear a 48-hour Holter.

## 2015-09-05 DIAGNOSIS — E7800 Pure hypercholesterolemia, unspecified: Secondary | ICD-10-CM | POA: Diagnosis not present

## 2015-09-05 DIAGNOSIS — K219 Gastro-esophageal reflux disease without esophagitis: Secondary | ICD-10-CM | POA: Diagnosis not present

## 2015-09-05 DIAGNOSIS — N529 Male erectile dysfunction, unspecified: Secondary | ICD-10-CM | POA: Diagnosis not present

## 2015-09-05 DIAGNOSIS — N3281 Overactive bladder: Secondary | ICD-10-CM | POA: Diagnosis not present

## 2015-09-05 DIAGNOSIS — I10 Essential (primary) hypertension: Secondary | ICD-10-CM | POA: Diagnosis not present

## 2015-09-05 DIAGNOSIS — J309 Allergic rhinitis, unspecified: Secondary | ICD-10-CM | POA: Diagnosis not present

## 2015-09-05 DIAGNOSIS — Z7901 Long term (current) use of anticoagulants: Secondary | ICD-10-CM | POA: Diagnosis not present

## 2015-09-05 DIAGNOSIS — Z1389 Encounter for screening for other disorder: Secondary | ICD-10-CM | POA: Diagnosis not present

## 2015-09-11 DIAGNOSIS — R791 Abnormal coagulation profile: Secondary | ICD-10-CM | POA: Diagnosis not present

## 2015-09-15 NOTE — Progress Notes (Signed)
Cardiology Office Note    Date:  09/16/2015   ID:  PACER WEHR, DOB 10/29/1945, MRN [id number]  PCP:  [name], MD  Cardiologist:  Dr. Daneen Schick   Electrophysiologist:  n/a  Chief Complaint  Patient presents with  . Hospitalization Follow-up    pt has no complaints  . Atrial Fibrillation    History of Present Illness:  Ryan [name] is a 69 y.o. 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 Last seen by Dr. Tamala Julian 9/16.  Recently admitted 12/9-12/10 with AKI in the setting of paroxysmal AF with RVR. Beta blocker dose was adjusted. He was hydrated with improved creatinine prior to discharge.  48 hour Holter monitor has been suggested by Dr. Tamala Julian. This is currently pending.  Since DC, the patient has been doing well. Denies chest pain, significant dyspnea, orthopnea, PND or edema. He denies significant fatigue, significant dizziness, syncope or near syncope.   Past Medical History  Diagnosis Date  . Hypertension   . Atrial fibrillation ([place])   . Diabetes mellitus without complication ([place])   . Peripheral vascular disease ([place])   . Endocarditis     Past Surgical History  Procedure Laterality Date  . I&d extremity  09/14/2012    Procedure: IRRIGATION AND DEBRIDEMENT EXTREMITY;  Surgeon: Tennis Must, MD;  Location: [place];  Service: Orthopedics;  Laterality: Right;  . Extremity wire/pin removal  09/14/2012    Procedure: REMOVAL K-WIRE/PIN EXTREMITY;  Surgeon: [name], MD;  Location: [place];  Service: Orthopedics;  Laterality: Right;  Right Foot  . Tee without cardioversion  09/18/2012    Procedure: TRANSESOPHAGEAL ECHOCARDIOGRAM (TEE);  Surgeon: [name], MD;  Location: [facility] ENDOSCOPY;  Service: Cardiovascular;  Laterality: N/A;  . Mitral valve replacement  09/26/2012    Procedure: MITRAL VALVE (MV) REPLACEMENT;  Surgeon: [name], MD;  Location: [place];   Service: Open Heart Surgery;  Laterality: N/A;  . Intraoperative transesophageal echocardiogram  09/26/2012    Procedure: INTRAOPERATIVE TRANSESOPHAGEAL ECHOCARDIOGRAM;  Surgeon: [name], MD;  Location: [facility] OR;  Service: Open Heart Surgery;  Laterality: N/A;  . Coronary angiogram  09/21/2012    Procedure: CORONARY ANGIOGRAM;  Surgeon: [name], MD;  Location: [facility] CATH LAB;  Service: Cardiovascular;;  . Right heart catheterization  09/21/2012    Procedure: RIGHT HEART CATH;  Surgeon: [name], MD;  Location: [facility] CATH LAB;  Service: Cardiovascular;;  . Abdominal aortagram N/A 10/03/2012    Procedure: ABDOMINAL Maxcine Ham;  Surgeon: [name], MD;  Location: [facility] CATH LAB;  Service: Cardiovascular;  Laterality: N/A;    Current Outpatient Prescriptions  Medication Sig Dispense Refill  . amLODipine (NORVASC) 2.5 MG tablet Take 2.5 mg by mouth 2 (two) times daily.    Marland Kitchen atorvastatin (LIPITOR) 40 MG tablet Take 1 tablet (40 mg total) by mouth daily at 6 PM. 30 tablet 5  . HYDROcodone-acetaminophen (NORCO/VICODIN) 5-325 MG per tablet Take 1 tablet by mouth every 6 (six) hours as needed for moderate pain.    . pantoprazole (PROTONIX) 40 MG tablet Take 1 tablet by mouth daily.    . potassium chloride SA (K-DUR,KLOR-CON) 20 MEQ tablet Take 2 tablets (40 mEq total) by mouth daily. (Patient taking differently: Take 20 mEq by mouth daily. ) 30 tablet 0  . traMADol (ULTRAM) 50 MG tablet Take 50 mg by mouth as needed. Take 1 tab as  needed for pain as directed    . triamcinolone cream (KENALOG) 0.5 % Apply 1 application topically as needed (SKIN).     . valsartan (DIOVAN) 40 MG tablet Take 1 tablet (40 mg total) by mouth daily. 30 tablet 0  . warfarin (COUMADIN) 6 MG tablet Take 6 mg by mouth every other day , alternating with 9 mg by mouth on opposite days.  1   No current facility-administered medications for this visit.    Allergies:   [name]   Social History   Social History  .  Marital Status: Legally Separated    Spouse Name: N/A  . Number of Children: 3  . Years of Education: N/A   Social History Main Topics  . Smoking status: Never Smoker   . Smokeless tobacco: Never Used  . Alcohol Use: No  . Drug Use: No  . Sexual Activity: Not Asked   Other Topics Concern  . None   Social History Narrative   Daughter lives with him.  Retired Advertising account planner     Family History:  The patient's family history includes Hypertension in his father and mother.   ROS:   Please see the history of present illness.    Review of Systems  Respiratory: Positive for shortness of breath.   All other systems reviewed and are negative.   PHYSICAL EXAM:   VS:  BP 140/60 mmHg  Pulse 64  Ht 6' (1.829 m)  Wt 181 lb 3.2 oz (82.192 kg)  BMI 24.57 kg/m2   GEN: Well nourished, well developed, in no acute distress HEENT: normal Neck: no JVD, no masses Cardiac: Normal S1/S2, irregular rhythm; no murmurs, rubs, or gallops, no edema;     Respiratory:  clear to auscultation bilaterally; no wheezing, rhonchi or rales GI: soft, nontender, nondistended, + BS MS: no deformity or atrophy Skin: warm and dry, no rash Neuro:  Bilateral strength equal, no focal deficits  Psych: Alert and oriented x 3, normal affect   Wt Readings from Last 3 Encounters:  09/16/15 181 lb 3.2 oz (82.192 kg)  08/30/15 172 lb 4.8 oz (78.155 kg)  06/10/15 179 lb 12.8 oz (81.557 kg)      Studies/Labs Reviewed:   EKG:  EKG is  ordered today.  The ekg ordered today demonstrates junctional rhythm with ventricular escape, effective heart rate is approximately 32 bpm  Recent Labs: 08/29/2015: B Natriuretic Peptide 333.2*; Hemoglobin 13.2; Platelets 153 08/30/2015: BUN 15; Creatinine, Ser 1.35*; Magnesium 2.0; Potassium 4.1; Sodium 144; TSH 1.779   Recent Lipid Panel    Component Value Date/Time   CHOL 121 09/16/2012 0520   TRIG 174* 09/16/2012 0520   HDL 6* 09/16/2012 0520   CHOLHDL 20.2 09/16/2012 0520    VLDL 35 09/16/2012 0520   LDLCALC 80 09/16/2012 0520    Additional studies/ records that were reviewed today include:   ABI 02/05/15 R 0.77; L 1.19  Event Monitor 3/16 NSR  Carotid US 1/14 No evidence of significant ICA stenosis bilaterally  LHC 1/14 Widely patent coronary arteries Moderate to severe pulmonary hypertension with mean pulmonary capillary wedge pressure of 14 mmHg no significant V wave.  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 Junctional rhythm   2. Paroxysmal atrial fibrillation (HCC)   3. Chronic diastolic heart failure ([place])   4. Essential hypertension   5.  Status post mitral valve replacement with bioprosthetic valve     PLAN:  In order of problems listed above:  1. Junctional Rhythm - As noted, his ECG demonstrates junctional rhythm with ventricular escape with HR ~ 32.  I reviewed this with Dr. Allegra Lai today.  We will stop his Metoprolol. He may indeed need a pacemaker in the future. I reviewed this with him today.  Return later this week for a repeat ECG with the RN to make sure his rhythm improves.  We discussed the importance of going to the ED if he has sudden weakness, near syncope or syncope.  I will have him FU with EP in the next 2-3 weeks.   2. PAF -  CHADS2-VASc=3.  Continue Coumadin.  3. Diastolic HF - Volume stable.  4. HTN - BP borderline controlled.  His angiotensin receptor blocker was lowered in the hospital.  We are [place] his beta-blocker as noted. If BP runs higher, will need to consider increasing his Diovan back to 80 mg QD.   5. S/p MVR - Bioprosthetic.  Continue SBE prophylaxis.       Medication Adjustments/Labs and Tests Ordered: Current medicines are reviewed at length with the patient today.  Concerns regarding medicines are outlined above.  Medication  changes, Labs and Tests ordered today are listed below. Patient Instructions  Medication Instructions:  Your physician has recommended you make the following change in your medication:  1. Discontinue metoprolol  Labwork: -None  Testing/Procedures: -None  Follow-Up: Your physician recommends that you keep your scheduled  follow-up appointment with Dr. Baird Kay  Appointment was made for you on Friday, December 30 @ 11:00 am for a EKG  Any Other Special Instructions Will Be Listed Below (If Applicable). Go to emergency room if you feel weak, dizzy, near syncope, or syncope  If you need a refill on your cardiac medications before your next appointment, please call your pharmacy.     Signed, Richardson Dopp, PA-C  09/16/2015 3:37 PM    [facility] [address], [place], [place]  [address] Phone: [phone]; Fax: [phone]

## 2015-09-16 ENCOUNTER — Ambulatory Visit (INDEPENDENT_AMBULATORY_CARE_PROVIDER_SITE_OTHER): Payer: Commercial Managed Care - HMO | Admitting: Physician Assistant

## 2015-09-16 ENCOUNTER — Ambulatory Visit: Payer: Commercial Managed Care - HMO

## 2015-09-16 ENCOUNTER — Encounter: Payer: Self-pay | Admitting: Physician Assistant

## 2015-09-16 VITALS — BP 140/60 | HR 64 | Ht 72.0 in | Wt 181.2 lb

## 2015-09-16 DIAGNOSIS — I480 Paroxysmal atrial fibrillation: Secondary | ICD-10-CM | POA: Diagnosis not present

## 2015-09-16 DIAGNOSIS — Z953 Presence of xenogenic heart valve: Secondary | ICD-10-CM

## 2015-09-16 DIAGNOSIS — I471 Supraventricular tachycardia: Secondary | ICD-10-CM | POA: Insufficient documentation

## 2015-09-16 DIAGNOSIS — I5032 Chronic diastolic (congestive) heart failure: Secondary | ICD-10-CM

## 2015-09-16 DIAGNOSIS — I10 Essential (primary) hypertension: Secondary | ICD-10-CM | POA: Diagnosis not present

## 2015-09-16 DIAGNOSIS — I498 Other specified cardiac arrhythmias: Secondary | ICD-10-CM | POA: Diagnosis not present

## 2015-09-16 NOTE — Patient Instructions (Addendum)
Medication Instructions:  Your physician has recommended you make the following change in your medication:  1. Discontinue metoprolol  Labwork: -None  Testing/Procedures: -None  Follow-Up: Your physician recommends that you keep your scheduled  follow-up appointment with Dr. Baird Kay  Appointment was made for you on Friday, December 30 @ 11:00 am for a EKG  Any Other Special Instructions Will Be Listed Below (If Applicable). Go to emergency room if you feel weak, dizzy, near syncope, or syncope  If you need a refill on your cardiac medications before your next appointment, please call your pharmacy.

## 2015-09-19 ENCOUNTER — Ambulatory Visit (INDEPENDENT_AMBULATORY_CARE_PROVIDER_SITE_OTHER): Payer: Commercial Managed Care - HMO | Admitting: *Deleted

## 2015-09-19 ENCOUNTER — Encounter: Payer: Self-pay | Admitting: *Deleted

## 2015-09-19 VITALS — BP 140/66 | HR 48 | Temp 97.6°F | Ht 72.0 in | Wt 181.0 lb

## 2015-09-19 DIAGNOSIS — R9431 Abnormal electrocardiogram [ECG] [EKG]: Secondary | ICD-10-CM

## 2015-09-19 NOTE — Progress Notes (Signed)
Patient in for ECG recheck after d/c of his metoprolol.  No c/o cp,sob,nausea, dizziness.  Ran ECG, reviewed with the DOD=  Accelerated junctional rhythm , not much change from prior ECG 3 days ago.  Recommended keeping meds the same and coming to EP appt on 10-01-15 with Dr [name].  Relayed to the patient.  Patient expressed understanding.

## 2015-09-29 DIAGNOSIS — I498 Other specified cardiac arrhythmias: Secondary | ICD-10-CM | POA: Diagnosis not present

## 2015-09-29 DIAGNOSIS — E876 Hypokalemia: Secondary | ICD-10-CM | POA: Diagnosis not present

## 2015-09-29 DIAGNOSIS — J309 Allergic rhinitis, unspecified: Secondary | ICD-10-CM | POA: Diagnosis not present

## 2015-09-29 DIAGNOSIS — E7800 Pure hypercholesterolemia, unspecified: Secondary | ICD-10-CM | POA: Diagnosis not present

## 2015-09-29 DIAGNOSIS — N183 Chronic kidney disease, stage 3 (moderate): Secondary | ICD-10-CM | POA: Diagnosis not present

## 2015-09-29 DIAGNOSIS — I10 Essential (primary) hypertension: Secondary | ICD-10-CM | POA: Diagnosis not present

## 2015-09-29 DIAGNOSIS — M109 Gout, unspecified: Secondary | ICD-10-CM | POA: Diagnosis not present

## 2015-09-29 DIAGNOSIS — N529 Male erectile dysfunction, unspecified: Secondary | ICD-10-CM | POA: Diagnosis not present

## 2015-09-29 DIAGNOSIS — K219 Gastro-esophageal reflux disease without esophagitis: Secondary | ICD-10-CM | POA: Diagnosis not present

## 2015-10-01 ENCOUNTER — Ambulatory Visit (INDEPENDENT_AMBULATORY_CARE_PROVIDER_SITE_OTHER): Payer: Commercial Managed Care - HMO | Admitting: Cardiology

## 2015-10-01 ENCOUNTER — Encounter: Payer: Self-pay | Admitting: Cardiology

## 2015-10-01 VITALS — BP 135/72 | HR 74 | Ht 72.0 in | Wt 181.0 lb

## 2015-10-01 DIAGNOSIS — I498 Other specified cardiac arrhythmias: Secondary | ICD-10-CM | POA: Diagnosis not present

## 2015-10-01 NOTE — Progress Notes (Signed)
Electrophysiology Office Note   Date:  10/01/2015   ID:  Ryan [name], DOB May 18, 1946, MRN [id number]  PCP:  Ryan Nakai, MD  Cardiologist:  Ryan [name] Primary Electrophysiologist:  Ryan Meredith Leeds, MD    Chief Complaint  Patient presents with  . Bradycardia     History of Present Illness: Ryan [name] is a 70 y.o. male who presents today for electrophysiology evaluation.   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   He was seen in clinic by the nurse practitioners who found that his heart rate was very slow. He was in junctional rhythm at the time. He was feeling well at the time he did not have any complaints. His metoprolol was stopped and a repeat EKG was done which also showed bradycardia and junctional rhythm. He comes into clinic today for evaluation of his junctional rhythm. Today he is in sinus rhythm without any complaints. He does say that last weekend he had an episode of weakness and fatigue that he attributes to changes in his diet.  Today, he denies symptoms of palpitations, chest pain, shortness of breath, orthopnea, PND, lower extremity edema, claudication, dizziness, presyncope, syncope, bleeding, or neurologic sequela. The patient is tolerating medications without difficulties and is otherwise without complaint today.    Past Medical History  Diagnosis Date  . Hypertension   . Atrial fibrillation ([place])   . Diabetes mellitus without complication (Ryan [name])   . Peripheral vascular disease (Ryan [name])   . Endocarditis    Past Surgical History  Procedure Laterality Date  . I&d extremity  09/14/2012    Procedure: IRRIGATION AND DEBRIDEMENT EXTREMITY;  Surgeon: Ryan Must, MD;  Location: [place];  Service: Orthopedics;  Laterality: Right;  . Extremity wire/pin removal  09/14/2012    Procedure: REMOVAL K-WIRE/PIN EXTREMITY;  Surgeon: Ryan Corning, MD;  Location: [place];  Service:  Orthopedics;  Laterality: Right;  Right Foot  . Tee without cardioversion  09/18/2012    Procedure: TRANSESOPHAGEAL ECHOCARDIOGRAM (TEE);  Surgeon: Ryan Furbish, MD;  Location[facility] ENDOSCOPY;  Service: Cardiovascular;  Laterality: N/A;  . Mitral valve replacement  09/26/2012    Procedure: MITRAL VALVE (MV) REPLACEMENT;  Surgeon: Ryan Pollack, MD;  Location: [place];  Service: Open Heart Surgery;  Laterality: N/A;  . Intraoperative transesophageal echocardiogram  09/26/2012    Procedure: INTRAOPERATIVE TRANSESOPHAGEAL ECHOCARDIOGRAM;  Surgeon: Ryan Pollack, MD;  Location: [facility] OR;  Service: Open Heart Surgery;  Laterality: N/A;  . Coronary angiogram  09/21/2012    Procedure: CORONARY ANGIOGRAM;  Surgeon: Ryan Grooms, MD;  Location: [facility] CATH LAB;  Service: Cardiovascular;;  . Right heart catheterization  09/21/2012    Procedure: RIGHT HEART CATH;  Surgeon: Ryan Grooms, MD;  Location: [facility] CATH LAB;  Service: Cardiovascular;;  . Abdominal aortagram N/A 10/03/2012    Procedure: ABDOMINAL Maxcine Ham;  Surgeon: Ryan Mitchell, MD;  Location: [facility] CATH LAB;  Service: Cardiovascular;  Laterality: N/A;     Current Outpatient Prescriptions  Medication Sig Dispense Refill  . atorvastatin (LIPITOR) 40 MG tablet Take 1 tablet (40 mg total) by mouth daily at 6 PM. 30 tablet 5  . HYDROcodone-acetaminophen (NORCO/VICODIN) 5-325 MG per tablet Take 1 tablet by mouth every 6 (six) hours as needed for moderate pain.    . pantoprazole (PROTONIX) 40 MG tablet Take 1 tablet by mouth daily.    . potassium chloride SA (K-DUR,KLOR-CON) 20 MEQ tablet Take  20 mEq by mouth once.    . traMADol (ULTRAM) 50 MG tablet Take 50 mg by mouth as needed. Take 1 tab as needed for pain as directed    . triamcinolone cream (KENALOG) 0.5 % Apply 1 application topically as needed (SKIN).     . valsartan (DIOVAN) 40 MG tablet Take 1 tablet (40 mg total) by mouth daily. 30 tablet 0  . warfarin (COUMADIN) 6 MG tablet Take 6 mg by mouth every  other day , alternating with 9 mg by mouth on opposite days.  1   No current facility-administered medications for this visit.    Allergies:   [name]   Social History:  The patient  reports that he has never smoked. He has never used smokeless tobacco. He reports that he does not drink alcohol or use illicit drugs.   Family History:  The patient's family history includes Hypertension in his father and mother.    ROS:  Please see the history of present illness.   All other systems are reviewed and negative.    PHYSICAL EXAM: VS:  BP 135/72 mmHg  Pulse 74  Ht 6' (1.829 m)  Wt 181 lb (82.101 kg)  BMI 24.54 kg/m2 , BMI Body mass index is 24.54 kg/(m^2). GEN: Well nourished, well developed, in no acute distress HEENT: normal Neck: no JVD, carotid bruits, or masses Cardiac: RRR; no murmurs, rubs, or gallops,no edema  Respiratory:  clear to auscultation bilaterally, normal work of breathing GI: soft, nontender, nondistended, + BS MS: no deformity or atrophy Skin: warm and dry Neuro:  Strength and sensation are intact Psych: euthymic mood, full affect  EKG:  EKG is ordered today. The ekg ordered today shows sinus rhythm, incomplete RBBB, RAD  Recent Labs: 08/29/2015: B Natriuretic Peptide 333.2*; Hemoglobin 13.2; Platelets 153 08/30/2015: BUN 15; Creatinine, Ser 1.35*; Magnesium 2.0; Potassium 4.1; Sodium 144; TSH 1.779    Lipid Panel     Component Value Date/Time   CHOL 121 09/16/2012 0520   TRIG 174* 09/16/2012 0520   HDL 6* 09/16/2012 0520   CHOLHDL 20.2 09/16/2012 0520   VLDL 35 09/16/2012 0520   LDLCALC 80 09/16/2012 0520     Wt Readings from Last 3 Encounters:  10/01/15 181 lb (82.101 kg)  09/19/15 181 lb (82.101 kg)  09/16/15 181 lb 3.2 oz (82.192 kg)      Other studies Reviewed: Additional studies/ records that were reviewed today include: 2013 TEE Review of the above records today demonstrates:  - Left ventricle: The cavity size was normal. Wall  thickness was normal. Systolic function was normal. The estimated ejection fraction was in the range of 60% to 65%. - Aortic valve: No evidence of vegetation. - Mitral valve: Large 2.5cm diameter vegetation/ mass on the atrial surface of the anterior mitral leaflet with central cavity (Doppler flow within) leading to two separate mitral regurgitant jets (moderate grade) exiting from the mass itself. - Left atrium: No evidence of thrombus in the atrial cavity or appendage. - Right atrium: No evidence of thrombus in the atrial cavity or appendage. - Atrial septum: No defect or patent foramen ovale was identified. - Tricuspid valve: No evidence of vegetation. - Pulmonic valve: No evidence of vegetation. - Superior vena cava: The study excluded a thrombus. - Pericardium, extracardiac: A small, free-flowing pericardial effusion was identified along the right atrial free wall.  ASSESSMENT AND PLAN:  1.  1. Junctional Rhythm - As noted, his ECG demonstrates junctional rhythm with ventricular escape with HR ~  34. He has not had symptoms and is feeling well currently. He says that he has stopped his metoprolol and has not been taking it. His EKG today shows sinus rhythm with a rate of 74. Due to that we Ryan continue to monitor him for further signs and symptoms of bradycardia. I'll put a 48-hour monitor on him to see what his average heart rate is. I have told him that if he has any further dyspnea weakness or shortness of breath to call the clinic.  2. PAF - CHADS2-VASc=3. Continue Coumadin.  3. Diastolic HF - Volume stable.  4. HTN - BP borderline controlled.   5. S/p MVR - Bioprosthetic. Continue SBE prophylaxis.     Current medicines are reviewed at length with the patient today.   The patient does not have concerns regarding his medicines.  The following changes were made today:  none  Labs/ tests ordered today include:  Orders Placed This Encounter   Procedures  . EKG 12-Lead     Disposition:   FU with Ryan Camnitz prn  Signed, Ryan Meredith Leeds, MD  10/01/2015 11:54 AM     [facility] [address] [place] [address] [phone] (office) [phone] (fax)

## 2015-10-01 NOTE — Patient Instructions (Signed)
Medication Instructions:  Your physician recommends that you continue on your current medications as directed. Please refer to the Current Medication list given to you today.  Labwork: None ordered  Testing/Procedures: Your physician has recommended that you wear a 48 hour holter monitor. Holter monitors are medical devices that record the heart's electrical activity. Doctors most often use these monitors to diagnose arrhythmias. Arrhythmias are problems with the speed or rhythm of the heartbeat. The monitor is a small, portable device. You can wear one while you do your normal daily activities. This is usually used to diagnose what is causing palpitations/syncope (passing out).  Follow-Up: To be determined once monitor results are reviewed.  If you need a refill on your cardiac medications before your next appointment, please call your pharmacy.  Thank you for choosing [facility]!!   Trinidad Curet, RN [phone]

## 2015-10-02 ENCOUNTER — Ambulatory Visit (INDEPENDENT_AMBULATORY_CARE_PROVIDER_SITE_OTHER): Payer: Commercial Managed Care - HMO

## 2015-10-02 DIAGNOSIS — I498 Other specified cardiac arrhythmias: Secondary | ICD-10-CM

## 2015-10-13 DIAGNOSIS — E7800 Pure hypercholesterolemia, unspecified: Secondary | ICD-10-CM | POA: Diagnosis not present

## 2015-10-13 DIAGNOSIS — K219 Gastro-esophageal reflux disease without esophagitis: Secondary | ICD-10-CM | POA: Diagnosis not present

## 2015-10-13 DIAGNOSIS — I10 Essential (primary) hypertension: Secondary | ICD-10-CM | POA: Diagnosis not present

## 2015-10-13 DIAGNOSIS — E876 Hypokalemia: Secondary | ICD-10-CM | POA: Diagnosis not present

## 2015-10-13 DIAGNOSIS — N183 Chronic kidney disease, stage 3 (moderate): Secondary | ICD-10-CM | POA: Diagnosis not present

## 2015-10-13 DIAGNOSIS — M159 Polyosteoarthritis, unspecified: Secondary | ICD-10-CM | POA: Diagnosis not present

## 2015-10-13 DIAGNOSIS — J309 Allergic rhinitis, unspecified: Secondary | ICD-10-CM | POA: Diagnosis not present

## 2015-10-13 DIAGNOSIS — M109 Gout, unspecified: Secondary | ICD-10-CM | POA: Diagnosis not present

## 2015-10-13 DIAGNOSIS — N529 Male erectile dysfunction, unspecified: Secondary | ICD-10-CM | POA: Diagnosis not present

## 2015-10-27 DIAGNOSIS — E7800 Pure hypercholesterolemia, unspecified: Secondary | ICD-10-CM | POA: Diagnosis not present

## 2015-10-27 DIAGNOSIS — J309 Allergic rhinitis, unspecified: Secondary | ICD-10-CM | POA: Diagnosis not present

## 2015-10-27 DIAGNOSIS — N183 Chronic kidney disease, stage 3 (moderate): Secondary | ICD-10-CM | POA: Diagnosis not present

## 2015-10-27 DIAGNOSIS — E876 Hypokalemia: Secondary | ICD-10-CM | POA: Diagnosis not present

## 2015-10-27 DIAGNOSIS — I10 Essential (primary) hypertension: Secondary | ICD-10-CM | POA: Diagnosis not present

## 2015-10-27 DIAGNOSIS — Z7901 Long term (current) use of anticoagulants: Secondary | ICD-10-CM | POA: Diagnosis not present

## 2015-10-27 DIAGNOSIS — N529 Male erectile dysfunction, unspecified: Secondary | ICD-10-CM | POA: Diagnosis not present

## 2015-10-27 DIAGNOSIS — K219 Gastro-esophageal reflux disease without esophagitis: Secondary | ICD-10-CM | POA: Diagnosis not present

## 2015-10-27 DIAGNOSIS — Z1389 Encounter for screening for other disorder: Secondary | ICD-10-CM | POA: Diagnosis not present

## 2015-11-14 ENCOUNTER — Encounter: Payer: Self-pay | Admitting: Cardiology

## 2015-11-14 ENCOUNTER — Ambulatory Visit (INDEPENDENT_AMBULATORY_CARE_PROVIDER_SITE_OTHER): Payer: Commercial Managed Care - HMO | Admitting: Cardiology

## 2015-11-14 VITALS — BP 160/80 | HR 69 | Ht 72.0 in | Wt 183.0 lb

## 2015-11-14 DIAGNOSIS — I493 Ventricular premature depolarization: Secondary | ICD-10-CM

## 2015-11-14 MED ORDER — CARVEDILOL 12.5 MG PO TABS: 12.5000 mg | ORAL_TABLET | Freq: Two times a day (BID) | ORAL | Status: DC

## 2015-11-14 NOTE — Patient Instructions (Addendum)
Medication Instructions:  Your physician recommends that you continue on your current medications as directed. Please refer to the Current Medication list given to you today.  Dr. [name] will contact you about plan of care and medication.  Labwork: None ordered  Testing/Procedures: None ordered  Follow-Up: Your physician recommends that you schedule a follow-up appointment in: 3 months with Dr. [name].  If you need a refill on your cardiac medications before your next appointment, please call your pharmacy.  Thank you for choosing [facility]!!   Trinidad Curet, RN [phone]

## 2015-11-14 NOTE — Progress Notes (Signed)
Electrophysiology Office Note   Date:  11/17/2015   ID:  Ryan [name], Ryan [name] [birth year], MRN [id number]  PCP:  [name], MD  Cardiologist:  Pernell Dupre Primary Electrophysiologist:  Constance Haw, MD    Chief Complaint  Patient presents with  . Follow-up     History of Present Illness: Ryan [name] is a 70 y.o. male who presents today for electrophysiology evaluation.   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 Last seen by Dr. Tamala Julian 9/16.  Recently admitted 12/9-12/10 with AKI in the setting of paroxysmal AF with RVR. Beta blocker dose was adjusted. He was hydrated with improved creatinine prior to discharge.   He has been feeling well since last being seen. He has no symptoms of chest pain or palpitations. I told him that he does have 20% PVCs he is unaware.  Today, he denies symptoms of palpitations, chest pain, shortness of breath, orthopnea, PND, lower extremity edema, claudication, dizziness, presyncope, syncope, bleeding, or neurologic sequela. The patient is tolerating medications without difficulties and is otherwise without complaint today.    Past Medical History  Diagnosis Date  . Hypertension   . Atrial fibrillation ([place])   . Diabetes mellitus without complication ([place])   . Peripheral vascular disease ([place])   . Endocarditis    Past Surgical History  Procedure Laterality Date  . I&d extremity  09/14/2012    Procedure: IRRIGATION AND DEBRIDEMENT EXTREMITY;  Surgeon: Tennis Must, MD;  Location: [place];  Service: Orthopedics;  Laterality: Right;  . Extremity wire/pin removal  09/14/2012    Procedure: REMOVAL K-WIRE/PIN EXTREMITY;  Surgeon: [name], MD;  Location: [place];  Service: Orthopedics;  Laterality: Right;  Right Foot  . Tee without cardioversion  09/18/2012    Procedure: TRANSESOPHAGEAL ECHOCARDIOGRAM (TEE);  Surgeon: [name],  MD;  Location: [facility] ENDOSCOPY;  Service: Cardiovascular;  Laterality: N/A;  . Mitral valve replacement  09/26/2012    Procedure: MITRAL VALVE (MV) REPLACEMENT;  Surgeon: [name], MD;  Location: [place];  Service: Open Heart Surgery;  Laterality: N/A;  . Intraoperative transesophageal echocardiogram  09/26/2012    Procedure: INTRAOPERATIVE TRANSESOPHAGEAL ECHOCARDIOGRAM;  Surgeon: [name], MD;  Location: [facility];  Service: Open Heart Surgery;  Laterality: N/A;  . Coronary angiogram  09/21/2012    Procedure: CORONARY ANGIOGRAM;  Surgeon: [name], MD;  Location: [facility] CATH LAB;  Service: Cardiovascular;;  . Right heart catheterization  09/21/2012    Procedure: RIGHT HEART CATH;  Surgeon: [name], MD;  Location: [facility] CATH LAB;  Service: Cardiovascular;;  . Abdominal aortagram N/A 10/03/2012    Procedure: ABDOMINAL Maxcine Ham;  Surgeon: [name], MD;  Location: [facility] CATH LAB;  Service: Cardiovascular;  Laterality: N/A;     Current Outpatient Prescriptions  Medication Sig Dispense Refill  . atorvastatin (LIPITOR) 40 MG tablet Take 1 tablet (40 mg total) by mouth daily at 6 PM. 30 tablet 5  . HYDROcodone-acetaminophen (NORCO/VICODIN) 5-325 MG per tablet Take 1 tablet by mouth every 6 (six) hours as needed for moderate pain.    . pantoprazole (PROTONIX) 40 MG tablet Take 1 tablet by mouth daily.    . potassium chloride SA (K-DUR,KLOR-CON) 20 MEQ tablet Take 20 mEq by mouth once.    . traMADol (ULTRAM) 50 MG tablet Take 50 mg by mouth as needed. Take 1 tab as needed for pain  as directed    . triamcinolone cream (KENALOG) 0.5 % Apply 1 application topically as needed (SKIN).     . valsartan (DIOVAN) 40 MG tablet Take 1 tablet (40 mg total) by mouth daily. 30 tablet 0  . warfarin (COUMADIN) 6 MG tablet Take 6 mg by mouth every other day , alternating with 9 mg by mouth on opposite days.  1   No current facility-administered medications for this visit.    Allergies:   [name]    Social History:  The patient  reports that he has never smoked. He has never used smokeless tobacco. He reports that he does not drink alcohol or use illicit drugs.   Family History:  The patient's family history includes Hypertension in his father and mother.    ROS:  Please see the history of present illness.   All other systems are reviewed and negative.    PHYSICAL EXAM: VS:  BP 160/80 mmHg  Pulse 69  Ht 6' (1.829 m)  Wt 183 lb (83.008 kg)  BMI 24.81 kg/m2 , BMI Body mass index is 24.81 kg/(m^2). GEN: Well nourished, well developed, in no acute distress HEENT: normal Neck: no JVD, carotid bruits, or masses Cardiac: irregular; no murmurs, rubs, or gallops,no edema  Respiratory:  clear to auscultation bilaterally, normal work of breathing GI: soft, nontender, nondistended, + BS MS: no deformity or atrophy Skin: warm and dry Neuro:  Strength and sensation are intact Psych: euthymic mood, full affect  EKG:  EKG is ordered today. The ekg ordered today shows junctional rhythm,  Ventricular bigeminy  Recent Labs: 08/29/2015: B Natriuretic Peptide 333.2*; Hemoglobin 13.2; Platelets 153 08/30/2015: BUN 15; Creatinine, Ser 1.35*; Magnesium 2.0; Potassium 4.1; Sodium 144; TSH 1.779    Lipid Panel     Component Value Date/Time   CHOL 121 09/16/2012 0520   TRIG 174* 09/16/2012 0520   HDL 6* 09/16/2012 0520   CHOLHDL 20.2 09/16/2012 0520   VLDL 35 09/16/2012 0520   LDLCALC 80 09/16/2012 0520     Wt Readings from Last 3 Encounters:  11/14/15 183 lb (83.008 kg)  10/01/15 181 lb (82.101 kg)  09/19/15 181 lb (82.101 kg)      Other studies Reviewed: Additional studies/ records that were reviewed today include: 48 hour monitor  Review of the above records today demonstrates:  Minimum: 59 bpm at 9:31 PM Mean: 88 bpm mV Maximum: 134 bpm at 9:44 AM Ventricular ectopy 22.08% of beats Supraventricular events 0.92% of beats Short runs of SVT   ASSESSMENT AND PLAN:  1.  1. Junctional Rhythm - No junctional rhythm noted on his heart monitor. We'll continue to follow.  2. PAF - CHADS2-VASc=3. Continue Coumadin.  3. 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 I discussed this with him and told him of the risk of having heart failure due to an increase in his PVCs.  Unfortunately he does have junctional rhythm, which limits our therapy for his PVCs. We will continue to monitor his PVCs , and get a repeat echo in 1 year to determine if he is had any decrease in his ejection fraction. At this time will not move forward with treatment. If he does require therapy for his PVCs, he would likely need a pacemaker as well as drug therapy. Will get an echo today to determine a baseline ejection fraction.  4. HTN - BP borderline controlled.   5. S/p MVR - Bioprosthetic. Continue SBE prophylaxis.    Current medicines are reviewed at length  with the patient today.   The patient does not have concerns regarding his medicines.  The following changes were made today:  Coreg 12.5 mg  Labs/ tests ordered today include:  Orders Placed This Encounter  Procedures  . EKG 12-Lead  . EKG 12-Lead     Disposition:   FU with Will Camnitz 6 months  Signed, Will Meredith Leeds, MD  11/17/2015 9:41 AM     [facility] [address] [place] [address] [phone] (office) [phone] (fax)

## 2015-11-19 ENCOUNTER — Telehealth: Payer: Self-pay | Admitting: *Deleted

## 2015-11-19 DIAGNOSIS — I493 Ventricular premature depolarization: Secondary | ICD-10-CM

## 2015-11-19 DIAGNOSIS — I480 Paroxysmal atrial fibrillation: Secondary | ICD-10-CM

## 2015-11-19 NOTE — Telephone Encounter (Signed)
Called patient informing him that Dr. [name] would like to schedule an echo to assess EF. Also explained that we will change follow up to 6 months. Patient verbalized understanding and agreeable to plan.  He understands office will call to arrange echo testing.

## 2015-11-24 DIAGNOSIS — M109 Gout, unspecified: Secondary | ICD-10-CM | POA: Diagnosis not present

## 2015-11-24 DIAGNOSIS — K219 Gastro-esophageal reflux disease without esophagitis: Secondary | ICD-10-CM | POA: Diagnosis not present

## 2015-11-24 DIAGNOSIS — J309 Allergic rhinitis, unspecified: Secondary | ICD-10-CM | POA: Diagnosis not present

## 2015-11-24 DIAGNOSIS — N529 Male erectile dysfunction, unspecified: Secondary | ICD-10-CM | POA: Diagnosis not present

## 2015-11-24 DIAGNOSIS — E876 Hypokalemia: Secondary | ICD-10-CM | POA: Diagnosis not present

## 2015-11-24 DIAGNOSIS — I10 Essential (primary) hypertension: Secondary | ICD-10-CM | POA: Diagnosis not present

## 2015-11-24 DIAGNOSIS — Z7901 Long term (current) use of anticoagulants: Secondary | ICD-10-CM | POA: Diagnosis not present

## 2015-11-24 DIAGNOSIS — E7800 Pure hypercholesterolemia, unspecified: Secondary | ICD-10-CM | POA: Diagnosis not present

## 2015-11-24 DIAGNOSIS — R001 Bradycardia, unspecified: Secondary | ICD-10-CM | POA: Diagnosis not present

## 2015-12-01 DIAGNOSIS — J309 Allergic rhinitis, unspecified: Secondary | ICD-10-CM | POA: Diagnosis not present

## 2015-12-01 DIAGNOSIS — M159 Polyosteoarthritis, unspecified: Secondary | ICD-10-CM | POA: Diagnosis not present

## 2015-12-01 DIAGNOSIS — N3281 Overactive bladder: Secondary | ICD-10-CM | POA: Diagnosis not present

## 2015-12-01 DIAGNOSIS — M109 Gout, unspecified: Secondary | ICD-10-CM | POA: Diagnosis not present

## 2015-12-01 DIAGNOSIS — N529 Male erectile dysfunction, unspecified: Secondary | ICD-10-CM | POA: Diagnosis not present

## 2015-12-01 DIAGNOSIS — K219 Gastro-esophageal reflux disease without esophagitis: Secondary | ICD-10-CM | POA: Diagnosis not present

## 2015-12-01 DIAGNOSIS — E876 Hypokalemia: Secondary | ICD-10-CM | POA: Diagnosis not present

## 2015-12-01 DIAGNOSIS — I498 Other specified cardiac arrhythmias: Secondary | ICD-10-CM | POA: Diagnosis not present

## 2015-12-01 DIAGNOSIS — E7800 Pure hypercholesterolemia, unspecified: Secondary | ICD-10-CM | POA: Diagnosis not present

## 2015-12-03 ENCOUNTER — Other Ambulatory Visit: Payer: Self-pay

## 2015-12-03 ENCOUNTER — Ambulatory Visit (HOSPITAL_COMMUNITY): Payer: Commercial Managed Care - HMO | Attending: Cardiology

## 2015-12-03 DIAGNOSIS — Z952 Presence of prosthetic heart valve: Secondary | ICD-10-CM | POA: Diagnosis not present

## 2015-12-03 DIAGNOSIS — I119 Hypertensive heart disease without heart failure: Secondary | ICD-10-CM | POA: Insufficient documentation

## 2015-12-03 DIAGNOSIS — I493 Ventricular premature depolarization: Secondary | ICD-10-CM

## 2015-12-03 DIAGNOSIS — I77810 Thoracic aortic ectasia: Secondary | ICD-10-CM | POA: Diagnosis not present

## 2015-12-03 DIAGNOSIS — E119 Type 2 diabetes mellitus without complications: Secondary | ICD-10-CM | POA: Insufficient documentation

## 2015-12-03 DIAGNOSIS — I480 Paroxysmal atrial fibrillation: Secondary | ICD-10-CM | POA: Diagnosis not present

## 2015-12-15 NOTE — Progress Notes (Signed)
Electrophysiology Office Note   Date:  12/16/2015   ID:  Ryan, [name] November 22, 1945, MRN [id number]  PCP:  Ryan Nakai, MD  Cardiologist:  Pernell Dupre Primary Electrophysiologist:  Constance Haw, MD    Chief Complaint  Patient presents with  . PVC  . Follow-up     History of Present Illness: Ryan [name] is a 70 y.o. male who presents today for electrophysiology evaluation.   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 Last seen by Dr. Tamala Julian 9/16.  Recently admitted 12/9-12/10 with AKI in the setting of paroxysmal AF with RVR. Beta blocker dose was adjusted. He was hydrated with improved creatinine prior to discharge.    he has been having a high volume of PVCs. His most recent monitor showed that his PVC burden was up to 20%. He had an echocardiogram showed an EF of 30-35%, which could be coming from his PVCs. He does, today, endorse shortness of breath and fatigue. He says that he feels better today than he did last few days.  Today, he denies symptoms of palpitations, chest pain, orthopnea, PND, lower extremity edema, claudication, dizziness, presyncope, syncope, bleeding, or neurologic sequela. The patient is tolerating medications without difficulties and is otherwise without complaint today.    Past Medical History  Diagnosis Date  . Hypertension   . Atrial fibrillation ([place])   . Diabetes mellitus without complication ([place])   . Peripheral vascular disease ([place])   . Endocarditis    Past Surgical History  Procedure Laterality Date  . I&d extremity  09/14/2012    Procedure: IRRIGATION AND DEBRIDEMENT EXTREMITY;  Surgeon: Tennis Must, MD;  Location: [place];  Service: Orthopedics;  Laterality: Right;  . Extremity wire/pin removal  09/14/2012    Procedure: REMOVAL K-WIRE/PIN EXTREMITY;  Surgeon: Ryan Corning, MD;  Location: [place];  Service: Orthopedics;   Laterality: Right;  Right Foot  . Tee without cardioversion  09/18/2012    Procedure: TRANSESOPHAGEAL ECHOCARDIOGRAM (TEE);  Surgeon: Ryan Furbish, MD;  Location: [facility] ENDOSCOPY;  Service: Cardiovascular;  Laterality: N/A;  . Mitral valve replacement  09/26/2012    Procedure: MITRAL VALVE (MV) REPLACEMENT;  Surgeon: Ryan Pollack, MD;  Location: [place];  Service: Open Heart Surgery;  Laterality: N/A;  . Intraoperative transesophageal echocardiogram  09/26/2012    Procedure: INTRAOPERATIVE TRANSESOPHAGEAL ECHOCARDIOGRAM;  Surgeon: Ryan Pollack, MD;  Location: [facility] OR;  Service: Open Heart Surgery;  Laterality: N/A;  . Coronary angiogram  09/21/2012    Procedure: CORONARY ANGIOGRAM;  Surgeon: Ryan Grooms, MD;  Location: [facility] CATH LAB;  Service: Cardiovascular;;  . Right heart catheterization  09/21/2012    Procedure: RIGHT HEART CATH;  Surgeon: Ryan Grooms, MD;  Location: [facility] CATH LAB;  Service: Cardiovascular;;  . Abdominal aortagram N/A 10/03/2012    Procedure: ABDOMINAL Maxcine Ham;  Surgeon: Ryan Mitchell, MD;  Location: [facility] CATH LAB;  Service: Cardiovascular;  Laterality: N/A;     Current Outpatient Prescriptions  Medication Sig Dispense Refill  . atorvastatin (LIPITOR) 40 MG tablet Take 1 tablet (40 mg total) by mouth daily at 6 PM. 30 tablet 5  . HYDROcodone-acetaminophen (NORCO/VICODIN) 5-325 MG per tablet Take 1 tablet by mouth every 6 (six) hours as needed for moderate pain.    . pantoprazole (PROTONIX) 40 MG tablet Take 1 tablet by mouth daily.    . potassium chloride SA (K-DUR,KLOR-CON) 20  MEQ tablet Take 20 mEq by mouth once.    . traMADol (ULTRAM) 50 MG tablet Take 50 mg by mouth as needed. Take 1 tab as needed for pain as directed    . triamcinolone cream (KENALOG) 0.5 % Apply 1 application topically as needed (SKIN).     . valsartan (DIOVAN) 40 MG tablet Take 1 tablet (40 mg total) by mouth daily. 30 tablet 0  . warfarin (COUMADIN) 6 MG tablet Take 6 mg by mouth every other day ,  alternating with 9 mg by mouth on opposite days.  1   No current facility-administered medications for this visit.    Allergies:   [name]   Social History:  The patient  reports that he has never smoked. He has never used smokeless tobacco. He reports that he does not drink alcohol or use illicit drugs.   Family History:  The patient's family history includes Hypertension in his father and mother.    ROS:  Please see the history of present illness.   All other systems are reviewed and negative.    PHYSICAL EXAM: VS:  BP 132/60 mmHg  Pulse 75  Ht 6' (1.829 m)  Wt 180 lb 9.6 oz (81.92 kg)  BMI 24.49 kg/m2 , BMI Body mass index is 24.49 kg/(m^2). GEN: Well nourished, well developed, in no acute distress HEENT: normal Neck: no JVD, carotid bruits, or masses Cardiac: irregular; no murmurs, rubs, or gallops,no edema  Respiratory:  clear to auscultation bilaterally, normal work of breathing GI: soft, nontender, nondistended, + BS MS: no deformity or atrophy Skin: warm and dry Neuro:  Strength and sensation are intact Psych: euthymic mood, full affect  EKG:  EKG is ordered today. The ekg ordered today shows sinus rhythm, ventricular bigeminy, rate 75 Recent Labs: 08/29/2015: B Natriuretic Peptide 333.2*; Hemoglobin 13.2; Platelets 153 08/30/2015: BUN 15; Creatinine, Ser 1.35*; Magnesium 2.0; Potassium 4.1; Sodium 144; TSH 1.779    Lipid Panel     Component Value Date/Time   CHOL 121 09/16/2012 0520   TRIG 174* 09/16/2012 0520   HDL 6* 09/16/2012 0520   CHOLHDL 20.2 09/16/2012 0520   VLDL 35 09/16/2012 0520   LDLCALC 80 09/16/2012 0520     Wt Readings from Last 3 Encounters:  12/16/15 180 lb 9.6 oz (81.92 kg)  11/14/15 183 lb (83.008 kg)  10/01/15 181 lb (82.101 kg)      Other studies Reviewed: Additional studies/ records that were reviewed today include: 48 hour monitor  Review of the above records today demonstrates:  Minimum: 59 bpm at 9:31 PM Mean: 88 bpm  mV Maximum: 134 bpm at 9:44 AM Ventricular ectopy 22.08% of beats Supraventricular events 0.92% of beats Short runs of SVT   ASSESSMENT AND PLAN:  1. 1. Junctional Rhythm - No junctional rhythm noted on his heart monitor. We'll continue to follow. Starting amiodarone today for his PVCs, we'll continue to monitor for further signs of a junctional rhythm.  2. PAF - CHADS2-VASc=3. Continue Coumadin.  3. 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  EF is currently decreased to 30-35% which could be due to PVC burden.  Leonore Frankson plan to start  Amiodarone today with a follow-up echo in 6 months , as well as hepatic panel and a TSH at that time. I did discuss ablation with him for treatment of his PVCs, but he feels that medications, at this time, would be the most appropriate. If he continues to have a high burden of PVCs when I  see him back in 6 months, he may benefit from ablation at that time.  4. NYHA class I heart failure: possibly due to PVCs.  On ARB currently.   He has unfortunately had junctional rhythm with metoprolol in the past, therefore we Mavis Fichera be unable to start it today. Starting amiodarone.  5. S/p MVR - Bioprosthetic. Continue SBE prophylaxis.   6. HTN: starting metoprolol along with current valsartan  Current medicines are reviewed at length with the patient today.   The patient does not have concerns regarding his medicines.  The following changes were made today:  Amiodarone  Labs/ tests ordered today include:  No orders of the defined types were placed in this encounter.     Disposition:   FU with Lanyiah Brix 6 months  Signed, Vashaun Osmon Meredith Leeds, MD  12/16/2015 11:13 AM     [facility] [address] [place] [address] [phone] (office) [phone] (fax)

## 2015-12-16 ENCOUNTER — Telehealth: Payer: Self-pay | Admitting: *Deleted

## 2015-12-16 ENCOUNTER — Ambulatory Visit (INDEPENDENT_AMBULATORY_CARE_PROVIDER_SITE_OTHER): Payer: Commercial Managed Care - HMO | Admitting: Cardiology

## 2015-12-16 ENCOUNTER — Encounter: Payer: Self-pay | Admitting: Cardiology

## 2015-12-16 VITALS — BP 132/60 | HR 75 | Ht 72.0 in | Wt 180.6 lb

## 2015-12-16 DIAGNOSIS — I493 Ventricular premature depolarization: Secondary | ICD-10-CM

## 2015-12-16 MED ORDER — AMIODARONE HCL 200 MG PO TABS: ORAL_TABLET | ORAL | Status: DC

## 2015-12-16 MED ORDER — AMIODARONE HCL 200 MG PO TABS: 200.0000 mg | ORAL_TABLET | Freq: Every day | ORAL | Status: DC

## 2015-12-16 MED ORDER — RANOLAZINE ER 500 MG PO TB12: 500.0000 mg | ORAL_TABLET | Freq: Two times a day (BID) | ORAL | Status: DC

## 2015-12-16 NOTE — Addendum Note (Signed)
Addended by: Stanton Kidney on: 12/16/2015 03:26 PM   Modules accepted: Orders, Medications

## 2015-12-16 NOTE — Telephone Encounter (Addendum)
Called patient and informed him of Dr. Macky Lower new order: Do not start Amiodarone Instead start [name] 500 mg BID Called and notified pharmacy and Dr. Marguerita Beards  (PCP office who also follows his Coumadin) office of medication change. Pt verbalized understanding of new medication instructions.

## 2015-12-16 NOTE — Telephone Encounter (Signed)
Informed pharmacy ok to fill. Call Dr Truman Hayward, PCP, and informed them of Amiodarone start because they are monitoring his coumadin. They will arrange to follow him closer w/ start of this medication.

## 2015-12-16 NOTE — Patient Instructions (Addendum)
Medication Instructions:  Your physician has recommended you make the following change in your medication:  1) START Amiodarone 200 mg  - take 200 mg twice a day for 7 days, then reduce and take 200 mg daily  Labwork: None ordered  Testing/Procedures: Your physician has requested that you have an echocardiogram in 6 months, before you come in to see physician. Echocardiography is a painless test that uses sound waves to create images of your heart. It provides your doctor with information about the size and shape of your heart and how well your heart's chambers and valves are working. This procedure takes approximately one hour. There are no restrictions for this procedure.  Follow-Up: Your physician wants you to follow-up in: 6 months with Dr. [name]. You will receive a reminder letter in the mail two months in advance. If you don't receive a letter, please call our office to schedule the follow-up appointment.  If you need a refill on your cardiac medications before your next appointment, please call your pharmacy.  Thank you for choosing [facility]!!   Trinidad Curet, RN [phone]   Amiodarone tablets What is this medicine? AMIODARONE (a MEE oh da rone) is an antiarrhythmic drug. It helps make your heart beat regularly. Because of the side effects caused by this medicine, it is only used when other medicines have not worked. It is usually used for heartbeat problems that may be life threatening. This medicine may be used for other purposes; ask your health care provider or pharmacist if you have questions. What should I tell my health care provider before I take this medicine? They need to know if you have any of these conditions: -liver disease -lung disease -other heart problems -thyroid disease -an unusual or allergic reaction to amiodarone, iodine, other medicines, foods, dyes, or preservatives -pregnant or trying to get pregnant -breast-feeding How should I use  this medicine? Take this medicine by mouth with a glass of water. Follow the directions on the prescription label. You can take this medicine with or without food. However, you should always take it the same way each time. Take your doses at regular intervals. Do not take your medicine more often than directed. Do not stop taking except on the advice of your doctor or health care professional. A special MedGuide will be given to you by the pharmacist with each prescription and refill. Be sure to read this information carefully each time. Talk to your pediatrician regarding the use of this medicine in children. Special care may be needed. Overdosage: If you think you have taken too much of this medicine contact a poison control center or emergency room at once. NOTE: This medicine is only for you. Do not share this medicine with others. What if I miss a dose? If you miss a dose, take it as soon as you can. If it is almost time for your next dose, take only that dose. Do not take double or extra doses. What may interact with this medicine? Do not take this medicine with any of the following medications: -abarelix -apomorphine -arsenic trioxide -certain antibiotics like erythromycin, gemifloxacin, levofloxacin, pentamidine -certain medicines for depression like amoxapine, tricyclic antidepressants -certain medicines for fungal infections like fluconazole, itraconazole, ketoconazole, posaconazole, voriconazole -certain medicines for irregular heart beat like disopyramide, dofetilide, dronedarone, ibutilide, propafenone, sotalol -certain medicines for malaria like chloroquine, halofantrine -cisapride -droperidol -haloperidol -hawthorn -maprotiline -methadone -phenothiazines like chlorpromazine, mesoridazine, thioridazine -pimozide -ranolazine -red yeast rice -vardenafil -ziprasidone This medicine may also interact with  the following medications: -antiviral medicines for HIV or  AIDS -certain medicines for blood pressure, heart disease, irregular heart beat -certain medicines for cholesterol like atorvastatin, cerivastatin, lovastatin, simvastatin -certain medicines for hepatitis C like sofosbuvir and ledipasvir; sofosbuvir -certain medicines for seizures like phenytoin -certain medicines for thyroid problems -certain medicines that treat or prevent blood clots like warfarin -cholestyramine -cimetidine -clopidogrel -cyclosporine -dextromethorphan -diuretics -fentanyl -general anesthetics -grapefruit juice -lidocaine -loratadine -methotrexate -other medicines that prolong the QT interval (cause an abnormal heart rhythm) -procainamide -quinidine -rifabutin, rifampin, or rifapentine -St. John's Wort -trazodone This list may not describe all possible interactions. Give your health care provider a list of all the medicines, herbs, non-prescription drugs, or dietary supplements you use. Also tell them if you smoke, drink alcohol, or use illegal drugs. Some items may interact with your medicine. What should I watch for while using this medicine? Your condition will be monitored closely when you first begin therapy. Often, this drug is first started in a hospital or other monitored health care setting. Once you are on maintenance therapy, visit your doctor or health care professional for regular checks on your progress. Because your condition and use of this medicine carry some risk, it is a good idea to carry an identification card, necklace or bracelet with details of your condition, medications, and doctor or health care professional. Dennis Bast may get drowsy or dizzy. Do not drive, use machinery, or do anything that needs mental alertness until you know how this medicine affects you. Do not stand or sit up quickly, especially if you are an older patient. This reduces the risk of dizzy or fainting spells. This medicine can make you more sensitive to the sun. Keep out of  the sun. If you cannot avoid being in the sun, wear protective clothing and use sunscreen. Do not use sun lamps or tanning beds/booths. You should have regular eye exams before and during treatment. Call your doctor if you have blurred vision, see halos, or your eyes become sensitive to light. Your eyes may get dry. It may be helpful to use a lubricating eye solution or artificial tears solution. If you are going to have surgery or a procedure that requires contrast dyes, tell your doctor or health care professional that you are taking this medicine. What side effects may I notice from receiving this medicine? Side effects that you should report to your doctor or health care professional as soon as possible: -allergic reactions like skin rash, itching or hives, swelling of the face, lips, or tongue -blue-gray coloring of the skin -blurred vision, seeing blue green halos, increased sensitivity of the eyes to light -breathing problems -chest pain -dark urine -fast, irregular heartbeat -feeling faint or light-headed -intolerance to heat or cold -nausea or vomiting -pain and swelling of the scrotum -pain, tingling, numbness in feet, hands -redness, blistering, peeling or loosening of the skin, including inside the mouth -spitting up blood -stomach pain -sweating -unusual or uncontrolled movements of body -unusually weak or tired -weight gain or loss -yellowing of the eyes or skin Side effects that usually do not require medical attention (report to your doctor or health care professional if they continue or are bothersome): -change in sex drive or performance -constipation -dizziness -headache -loss of appetite -trouble sleeping This list may not describe all possible side effects. Call your doctor for medical advice about side effects. You may report side effects to FDA at 1-800-FDA-1088. Where should I keep my medicine? Keep out of the reach of children.  Store at room temperature  between 20 and 25 degrees C (68 and 77 degrees F). Protect from light. Keep container tightly closed. Throw away any unused medicine after the expiration date. NOTE: This sheet is a summary. It may not cover all possible information. If you have questions about this medicine, talk to your doctor, pharmacist, or health care provider.    2016, Elsevier/[employer] Standard. (2013-12-10 19:48:11)

## 2015-12-16 NOTE — Telephone Encounter (Signed)
[facility] called and stated that there is a potential drug interaction between the amiodarone and warfarin. They will need a call back at [phone] before they will refill the amiodarone. Please advise. Thanks, MI

## 2015-12-17 DIAGNOSIS — R001 Bradycardia, unspecified: Secondary | ICD-10-CM | POA: Diagnosis not present

## 2015-12-17 DIAGNOSIS — J309 Allergic rhinitis, unspecified: Secondary | ICD-10-CM | POA: Diagnosis not present

## 2015-12-17 DIAGNOSIS — E7800 Pure hypercholesterolemia, unspecified: Secondary | ICD-10-CM | POA: Diagnosis not present

## 2015-12-17 DIAGNOSIS — L732 Hidradenitis suppurativa: Secondary | ICD-10-CM | POA: Diagnosis not present

## 2015-12-17 DIAGNOSIS — E876 Hypokalemia: Secondary | ICD-10-CM | POA: Diagnosis not present

## 2015-12-17 DIAGNOSIS — R791 Abnormal coagulation profile: Secondary | ICD-10-CM | POA: Diagnosis not present

## 2015-12-17 DIAGNOSIS — K219 Gastro-esophageal reflux disease without esophagitis: Secondary | ICD-10-CM | POA: Diagnosis not present

## 2015-12-17 DIAGNOSIS — N529 Male erectile dysfunction, unspecified: Secondary | ICD-10-CM | POA: Diagnosis not present

## 2015-12-17 DIAGNOSIS — M109 Gout, unspecified: Secondary | ICD-10-CM | POA: Diagnosis not present

## 2015-12-23 ENCOUNTER — Telehealth: Payer: Self-pay | Admitting: Cardiology

## 2015-12-23 DIAGNOSIS — N529 Male erectile dysfunction, unspecified: Secondary | ICD-10-CM | POA: Diagnosis not present

## 2015-12-23 DIAGNOSIS — J309 Allergic rhinitis, unspecified: Secondary | ICD-10-CM | POA: Diagnosis not present

## 2015-12-23 DIAGNOSIS — M109 Gout, unspecified: Secondary | ICD-10-CM | POA: Diagnosis not present

## 2015-12-23 DIAGNOSIS — E7800 Pure hypercholesterolemia, unspecified: Secondary | ICD-10-CM | POA: Diagnosis not present

## 2015-12-23 DIAGNOSIS — N3281 Overactive bladder: Secondary | ICD-10-CM | POA: Diagnosis not present

## 2015-12-23 DIAGNOSIS — K219 Gastro-esophageal reflux disease without esophagitis: Secondary | ICD-10-CM | POA: Diagnosis not present

## 2015-12-23 DIAGNOSIS — I498 Other specified cardiac arrhythmias: Secondary | ICD-10-CM | POA: Diagnosis not present

## 2015-12-23 DIAGNOSIS — M159 Polyosteoarthritis, unspecified: Secondary | ICD-10-CM | POA: Diagnosis not present

## 2015-12-23 DIAGNOSIS — E876 Hypokalemia: Secondary | ICD-10-CM | POA: Diagnosis not present

## 2015-12-23 NOTE — Telephone Encounter (Signed)
Pt agreeable to come by the office Friday for EKG.

## 2015-12-23 NOTE — Telephone Encounter (Signed)
Can get an ECG to see what his HR is and if he is continuing to have PVCs.  Allegra Lai, MD

## 2015-12-23 NOTE — Telephone Encounter (Signed)
lmtcb

## 2015-12-23 NOTE — Telephone Encounter (Signed)
Patient tells me that his PCP is concerned of low HR - pt says he thinks they said HR in the 40s. He states EKG was not performed but nurse felt radial pulse for this reported number. Pt says his PCP is concerned about this, which has in turn caused anxiety in patient. He denies any symptoms/complaints. He reports doing better since starting [name].  States that he feels them maybe once a week now. Tried to alleviate pt concerns by explaining that low, palpated, pulse is possibly secondary to PVCs and that EKG is warranted for true HR when having PVCs. Explained that we may need to perform EKG  but will discuss with physician.  Informed that I will send this to Dr. [name] for his review/advisement. Patient understands I will call him back.

## 2015-12-23 NOTE — Telephone Encounter (Signed)
Pt saw pcp and was told his heart rate was low, he wants to see Camnitz, I offered to make appt but he wants to talk to nurse, when ready can send him back to me to schedule

## 2015-12-23 NOTE — Telephone Encounter (Signed)
Pt returned call

## 2015-12-26 ENCOUNTER — Encounter: Payer: Self-pay | Admitting: *Deleted

## 2015-12-26 ENCOUNTER — Ambulatory Visit (INDEPENDENT_AMBULATORY_CARE_PROVIDER_SITE_OTHER): Payer: Commercial Managed Care - HMO | Admitting: *Deleted

## 2015-12-26 ENCOUNTER — Encounter: Payer: Self-pay | Admitting: Cardiovascular Disease

## 2015-12-26 VITALS — BP 141/64 | HR 78 | Ht 72.0 in | Wt 180.0 lb

## 2015-12-26 DIAGNOSIS — I493 Ventricular premature depolarization: Secondary | ICD-10-CM

## 2015-12-26 NOTE — Progress Notes (Signed)
In for EKG after PCP OV and concern for low HR Will forward to Dr [name]

## 2016-01-01 DIAGNOSIS — E876 Hypokalemia: Secondary | ICD-10-CM | POA: Diagnosis not present

## 2016-01-01 DIAGNOSIS — E7800 Pure hypercholesterolemia, unspecified: Secondary | ICD-10-CM | POA: Diagnosis not present

## 2016-01-01 DIAGNOSIS — K219 Gastro-esophageal reflux disease without esophagitis: Secondary | ICD-10-CM | POA: Diagnosis not present

## 2016-01-01 DIAGNOSIS — J309 Allergic rhinitis, unspecified: Secondary | ICD-10-CM | POA: Diagnosis not present

## 2016-01-01 DIAGNOSIS — M159 Polyosteoarthritis, unspecified: Secondary | ICD-10-CM | POA: Diagnosis not present

## 2016-01-01 DIAGNOSIS — N529 Male erectile dysfunction, unspecified: Secondary | ICD-10-CM | POA: Diagnosis not present

## 2016-01-01 DIAGNOSIS — I10 Essential (primary) hypertension: Secondary | ICD-10-CM | POA: Diagnosis not present

## 2016-01-01 DIAGNOSIS — M109 Gout, unspecified: Secondary | ICD-10-CM | POA: Diagnosis not present

## 2016-01-01 DIAGNOSIS — N183 Chronic kidney disease, stage 3 (moderate): Secondary | ICD-10-CM | POA: Diagnosis not present

## 2016-01-01 NOTE — Addendum Note (Signed)
Addended by: Freada Bergeron on: 01/01/2016 01:22 PM   Modules accepted: Orders

## 2016-01-29 DIAGNOSIS — E876 Hypokalemia: Secondary | ICD-10-CM | POA: Diagnosis not present

## 2016-01-29 DIAGNOSIS — E7800 Pure hypercholesterolemia, unspecified: Secondary | ICD-10-CM | POA: Diagnosis not present

## 2016-01-29 DIAGNOSIS — K219 Gastro-esophageal reflux disease without esophagitis: Secondary | ICD-10-CM | POA: Diagnosis not present

## 2016-01-29 DIAGNOSIS — J309 Allergic rhinitis, unspecified: Secondary | ICD-10-CM | POA: Diagnosis not present

## 2016-01-29 DIAGNOSIS — M159 Polyosteoarthritis, unspecified: Secondary | ICD-10-CM | POA: Diagnosis not present

## 2016-01-29 DIAGNOSIS — M109 Gout, unspecified: Secondary | ICD-10-CM | POA: Diagnosis not present

## 2016-01-29 DIAGNOSIS — I10 Essential (primary) hypertension: Secondary | ICD-10-CM | POA: Diagnosis not present

## 2016-01-29 DIAGNOSIS — N183 Chronic kidney disease, stage 3 (moderate): Secondary | ICD-10-CM | POA: Diagnosis not present

## 2016-01-29 DIAGNOSIS — N529 Male erectile dysfunction, unspecified: Secondary | ICD-10-CM | POA: Diagnosis not present

## 2016-02-03 DIAGNOSIS — R791 Abnormal coagulation profile: Secondary | ICD-10-CM | POA: Diagnosis not present

## 2016-02-09 ENCOUNTER — Encounter (HOSPITAL_COMMUNITY): Payer: Commercial Managed Care - HMO

## 2016-02-09 ENCOUNTER — Ambulatory Visit: Payer: Commercial Managed Care - HMO | Admitting: Family

## 2016-02-17 ENCOUNTER — Ambulatory Visit: Payer: Commercial Managed Care - HMO | Admitting: Cardiology

## 2016-03-04 DIAGNOSIS — E876 Hypokalemia: Secondary | ICD-10-CM | POA: Diagnosis not present

## 2016-03-04 DIAGNOSIS — N529 Male erectile dysfunction, unspecified: Secondary | ICD-10-CM | POA: Diagnosis not present

## 2016-03-04 DIAGNOSIS — E7800 Pure hypercholesterolemia, unspecified: Secondary | ICD-10-CM | POA: Diagnosis not present

## 2016-03-04 DIAGNOSIS — Z7901 Long term (current) use of anticoagulants: Secondary | ICD-10-CM | POA: Diagnosis not present

## 2016-03-04 DIAGNOSIS — J309 Allergic rhinitis, unspecified: Secondary | ICD-10-CM | POA: Diagnosis not present

## 2016-03-04 DIAGNOSIS — M109 Gout, unspecified: Secondary | ICD-10-CM | POA: Diagnosis not present

## 2016-03-04 DIAGNOSIS — I739 Peripheral vascular disease, unspecified: Secondary | ICD-10-CM | POA: Diagnosis not present

## 2016-03-04 DIAGNOSIS — R739 Hyperglycemia, unspecified: Secondary | ICD-10-CM | POA: Diagnosis not present

## 2016-03-04 DIAGNOSIS — K219 Gastro-esophageal reflux disease without esophagitis: Secondary | ICD-10-CM | POA: Diagnosis not present

## 2016-03-11 ENCOUNTER — Encounter: Payer: Self-pay | Admitting: Family

## 2016-03-18 ENCOUNTER — Telehealth: Payer: Self-pay | Admitting: *Deleted

## 2016-03-18 ENCOUNTER — Encounter: Payer: Self-pay | Admitting: Family

## 2016-03-18 ENCOUNTER — Ambulatory Visit (HOSPITAL_COMMUNITY)
Admission: RE | Admit: 2016-03-18 | Discharge: 2016-03-18 | Disposition: A | Discharge status: Home / Self Care | Payer: Commercial Managed Care - HMO | Source: Ambulatory Visit | Attending: Family | Admitting: Family

## 2016-03-18 ENCOUNTER — Other Ambulatory Visit: Payer: Self-pay | Admitting: Family

## 2016-03-18 ENCOUNTER — Ambulatory Visit (INDEPENDENT_AMBULATORY_CARE_PROVIDER_SITE_OTHER): Payer: Commercial Managed Care - HMO | Admitting: Family

## 2016-03-18 VITALS — BP 115/78 | HR 112 | Temp 97.4°F | Resp 16 | Ht 72.0 in | Wt 175.0 lb

## 2016-03-18 DIAGNOSIS — I10 Essential (primary) hypertension: Secondary | ICD-10-CM | POA: Diagnosis not present

## 2016-03-18 DIAGNOSIS — I739 Peripheral vascular disease, unspecified: Secondary | ICD-10-CM

## 2016-03-18 DIAGNOSIS — E119 Type 2 diabetes mellitus without complications: Secondary | ICD-10-CM | POA: Insufficient documentation

## 2016-03-18 DIAGNOSIS — I70201 Unspecified atherosclerosis of native arteries of extremities, right leg: Secondary | ICD-10-CM

## 2016-03-18 DIAGNOSIS — I743 Embolism and thrombosis of arteries of the lower extremities: Secondary | ICD-10-CM | POA: Diagnosis not present

## 2016-03-18 DIAGNOSIS — I779 Disorder of arteries and arterioles, unspecified: Secondary | ICD-10-CM | POA: Diagnosis not present

## 2016-03-18 DIAGNOSIS — R0989 Other specified symptoms and signs involving the circulatory and respiratory systems: Secondary | ICD-10-CM | POA: Diagnosis present

## 2016-03-18 NOTE — Telephone Encounter (Signed)
Advised patient to palpate pulses throughout the day and report pulses in 110s and above. Patient verbalized understanding and agreeable to plan.  He understands I will call him next week once Camnitz has reviewed and sent recommendation.   Informed patient that we may just have him monitor pulses for now and he is agreeable to that.

## 2016-03-18 NOTE — Patient Instructions (Signed)
Peripheral Vascular Disease Peripheral vascular disease (PVD) is a disease of the blood vessels that are not part of your heart and brain. A simple term for PVD is poor circulation. In most cases, PVD narrows the blood vessels that carry blood from your heart to the rest of your body. This can result in a decreased supply of blood to your arms, legs, and internal organs, like your stomach or kidneys. However, it most often affects a person's lower legs and feet. There are two types of PVD.  Organic PVD. This is the more common type. It is caused by damage to the structure of blood vessels.  Functional PVD. This is caused by conditions that make blood vessels contract and tighten (spasm). Without treatment, PVD tends to get worse over time. PVD can also lead to acute ischemic limb. This is when an arm or limb suddenly has trouble getting enough blood. This is a medical emergency. CAUSES Each type of PVD has many different causes. The most common cause of PVD is buildup of a fatty material (plaque) inside of your arteries (atherosclerosis). Small amounts of plaque can break off from the walls of the blood vessels and become lodged in a smaller artery. This blocks blood flow and can cause acute ischemic limb. Other common causes of PVD include:  Blood clots that form inside of blood vessels.  Injuries to blood vessels.  Diseases that cause inflammation of blood vessels or cause blood vessel spasms.  Health behaviors and health history that increase your risk of developing PVD. RISK FACTORS  You may have a greater risk of PVD if you:  Have a family history of PVD.  Have certain medical conditions, including:  High cholesterol.  Diabetes.  High blood pressure (hypertension).  Coronary heart disease.  Past problems with blood clots.  Past injury, such as burns or a broken bone. These may have damaged blood vessels in your limbs.  Buerger disease. This is caused by inflamed blood  vessels in your hands and feet.  Some forms of arthritis.  Rare birth defects that affect the arteries in your legs.  Use tobacco.  Do not get enough exercise.  Are obese.  Are age 50 or older. SIGNS AND SYMPTOMS  PVD may cause many different symptoms. Your symptoms depend on what part of your body is not getting enough blood. Some common signs and symptoms include:  Cramps in your lower legs. This may be a symptom of poor leg circulation (claudication).  Pain and weakness in your legs while you are physically active that goes away when you rest (intermittent claudication).  Leg pain when at rest.  Leg numbness, tingling, or weakness.  Coldness in a leg or foot, especially when compared with the other leg.  Skin or hair changes. These can include:  Hair loss.  Shiny skin.  Pale or bluish skin.  Thick toenails.  Inability to get or maintain an erection (erectile dysfunction). People with PVD are more prone to developing ulcers and sores on their toes, feet, or legs. These may take longer than normal to heal. DIAGNOSIS Your health care provider may diagnose PVD from your signs and symptoms. The health care provider will also do a physical exam. You may have tests to find out what is causing your PVD and determine its severity. Tests may include:  Blood pressure recordings from your arms and legs and measurements of the strength of your pulses (pulse volume recordings).  Imaging studies using sound waves to take pictures of   the blood flow through your blood vessels (Doppler ultrasound).  Injecting a dye into your blood vessels before having imaging studies using:  X-rays (angiogram or arteriogram).  Computer-generated X-rays (CT angiogram).  A powerful electromagnetic field and a computer (magnetic resonance angiogram or MRA). TREATMENT Treatment for PVD depends on the cause of your condition and the severity of your symptoms. It also depends on your age. Underlying  causes need to be treated and controlled. These include long-lasting (chronic) conditions, such as diabetes, high cholesterol, and high blood pressure. You may need to first try making lifestyle changes and taking medicines. Surgery may be needed if these do not work. Lifestyle changes may include:  Quitting smoking.  Exercising regularly.  Following a low-fat, low-cholesterol diet. Medicines may include:  Blood thinners to prevent blood clots.  Medicines to improve blood flow.  Medicines to improve your blood cholesterol levels. Surgical procedures may include:  A procedure that uses an inflated balloon to open a blocked artery and improve blood flow (angioplasty).  A procedure to put in a tube (stent) to keep a blocked artery open (stent implant).  Surgery to reroute blood flow around a blocked artery (peripheral bypass surgery).  Surgery to remove dead tissue from an infected wound on the affected limb.  Amputation. This is surgical removal of the affected limb. This may be necessary in cases of acute ischemic limb that are not improved through medical or surgical treatments. HOME CARE INSTRUCTIONS  Take medicines only as directed by your health care provider.  Do not use any tobacco products, including cigarettes, chewing tobacco, or electronic cigarettes. If you need help quitting, ask your health care provider.  Lose weight if you are overweight, and maintain a healthy weight as directed by your health care provider.  Eat a diet that is low in fat and cholesterol. If you need help, ask your health care provider.  Exercise regularly. Ask your health care provider to suggest some good activities for you.  Use compression stockings or other mechanical devices as directed by your health care provider.  Take good care of your feet.  Wear comfortable shoes that fit well.  Check your feet often for any cuts or sores. SEEK MEDICAL CARE IF:  You have cramps in your legs  while walking.  You have leg pain when you are at rest.  You have coldness in a leg or foot.  Your skin changes.  You have erectile dysfunction.  You have cuts or sores on your feet that are not healing. SEEK IMMEDIATE MEDICAL CARE IF:  Your arm or leg turns cold and blue.  Your arms or legs become red, warm, swollen, painful, or numb.  You have chest pain or trouble breathing.  You suddenly have weakness in your face, arm, or leg.  You become very confused or lose the ability to speak.  You suddenly have a very bad headache or lose your vision.   This information is not intended to replace advice given to you by your health care provider. Make sure you discuss any questions you have with your health care provider.   Document Released: 10/14/2004 Document Revised: 09/27/2014 Document Reviewed: 02/14/2014 Elsevier Interactive Patient Education 2016 [employer]  

## 2016-03-18 NOTE — Telephone Encounter (Signed)
Received call from Clemon Chambers, NP w [facility]. Notes this patient of Dr. [name] seen today in their office.  Pt having HR of 116-120, regular rhythm - not noted to be in A Fib.  No lightheadedness, dizziness, SOB chest pain.  Vinnie Level suggested to reach out to patient directly, to see if we can schedule to see provider.  Requested we call patient at [phone] [facility] or [place])  Informed her I would investigate appt availability and route to [facility] office for assistance. Since pt asymptomatic, will want recommendation as far as need for f/u. I spoke w Thomes Dinning who recommended to route to [facility] for EP provider.

## 2016-03-18 NOTE — Progress Notes (Signed)
[facility]   CC: Follow up peripheral artery occlusive disease  History of Present Illness Ryan [name] is a 70 y.o. male patient of Dr. Trula Slade who is back today for followup. Dr. Trula Slade met the patient in the hospital. He was originally referred for possible right leg ischemia. 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 He also had infected hardware removed from his right foot in December 2013. He developed a wound on his right foot. He underwent angiography which revealed an occluded right popliteal artery. We had been managing his wound nonoperatively. He was visiting the wound center frequently and has since been discharged from their care. He is able to ambulate without difficulty although he does use a cane around the house occasionally.   He exercises at a gym 1-2x/week, golfs once/month. He denies non healing wounds in feet or legs, states the right toe ulcer has healed. He stubbed his right second toe about a month ago, ecchymosis is resolving, no swelling, but he does have a slow to heal 3 mm diameter shallow ulcer that is being irritated by a small bony prominence at his great toe. Pt denies any history of stroke or TIA.  He takes coumadin s/p mitral valve replacement.  Pt Diabetic: 6.0 A1C on 03/04/16, in 7 pages of documentation of pt's visit with his PCP, Dr. Truman Hayward Pt smoker: non-smoker  Pt meds include: Statin :Yes Betablocker: Yes ASA: No Other anticoagulants/antiplatelets: coumadin    Past Medical History  Diagnosis Date  . Hypertension   . Atrial fibrillation ([place])   . Diabetes mellitus without complication ([place])   . Peripheral vascular disease ([place])   . Endocarditis     Social History Social History  Substance Use Topics  . Smoking status: Never Smoker   . Smokeless tobacco: Never Used  . Alcohol Use: No    Family History Family History  Problem Relation Age of  Onset  . Hypertension Mother   . Hypertension Father     Past Surgical History  Procedure Laterality Date  . I&d extremity  09/14/2012    Procedure: IRRIGATION AND DEBRIDEMENT EXTREMITY;  Surgeon: Tennis Must, MD;  Location: [place];  Service: Orthopedics;  Laterality: Right;  . Extremity wire/pin removal  09/14/2012    Procedure: REMOVAL K-WIRE/PIN EXTREMITY;  Surgeon: [name], MD;  Location: [place];  Service: Orthopedics;  Laterality: Right;  Right Foot  . Tee without cardioversion  09/18/2012    Procedure: TRANSESOPHAGEAL ECHOCARDIOGRAM (TEE);  Surgeon: [name], MD;  Location: [facility] ENDOSCOPY;  Service: Cardiovascular;  Laterality: N/A;  . Mitral valve replacement  09/26/2012    Procedure: MITRAL VALVE (MV) REPLACEMENT;  Surgeon: [name], MD;  Location: [place];  Service: Open Heart Surgery;  Laterality: N/A;  . Intraoperative transesophageal echocardiogram  09/26/2012    Procedure: INTRAOPERATIVE TRANSESOPHAGEAL ECHOCARDIOGRAM;  Surgeon: [name], MD;  Location: [facility] OR;  Service: Open Heart Surgery;  Laterality: N/A;  . Coronary angiogram  09/21/2012    Procedure: CORONARY ANGIOGRAM;  Surgeon: [name], MD;  Location: [facility] CATH LAB;  Service: Cardiovascular;;  . Right heart catheterization  09/21/2012    Procedure: RIGHT HEART CATH;  Surgeon: [name], MD;  Location: [facility] CATH LAB;  Service: Cardiovascular;;  . Abdominal aortagram N/A 10/03/2012    Procedure: ABDOMINAL Maxcine Ham;  Surgeon: [name], MD;  Location: [facility] CATH LAB;  Service: Cardiovascular;  Laterality: N/A;    Allergies  Allergen Reactions  . Geralyn Flash [Fish Allergy] Nausea And Vomiting    Current Outpatient Prescriptions  Medication Sig Dispense Refill  . atorvastatin (LIPITOR) 40 MG tablet Take 1 tablet (40 mg total) by mouth daily at 6 PM. 30 tablet 5  . HYDROcodone-acetaminophen (NORCO/VICODIN) 5-325 MG per tablet Take 1 tablet by mouth every 6 (six) hours as needed for moderate pain.    .  pantoprazole (PROTONIX) 40 MG tablet Take 1 tablet by mouth daily.    . potassium chloride SA (K-DUR,KLOR-CON) 20 MEQ tablet Take 20 mEq by mouth once.    . ranolazine ([name]) 500 MG 12 hr tablet Take 1 tablet (500 mg total) by mouth 2 (two) times daily. 60 tablet 3  . triamcinolone cream (KENALOG) 0.5 % Apply 1 application topically as needed (SKIN).     . valsartan (DIOVAN) 40 MG tablet Take 1 tablet (40 mg total) by mouth daily. 30 tablet 0  . warfarin (COUMADIN) 6 MG tablet Take 6 mg by mouth every other day , alternating with 9 mg by mouth on opposite days.  1  . traMADol (ULTRAM) 50 MG tablet Take 50 mg by mouth as needed. Reported on 03/18/2016     No current facility-administered medications for this visit.    ROS: See HPI for pertinent positives and negatives.   Physical Examination  Filed Vitals:   03/18/16 1538  BP: 115/78  Pulse: 112  Temp: 97.4 F (36.3 C)  Resp: 16  Height: 6' (1.829 m)  Weight: 175 lb (79.379 kg)  SpO2: 98%   Body mass index is 23.73 kg/(m^2).  General: A&O x 3, WDWN. Gait: normal Eyes: Pupils equal Pulmonary: CTAB, without wheezes , rales or rhonchi. Cardiac: regular tachycardic rhythm, 116-120, no detected murmur     Carotid Bruits Left Right   Negative Negative  Aorta is not palpable. Radial pulses: are 2+ palpable and =   VASCULAR EXAM: Extremities without ischemic changes  without Gangrene. Right second toe ecchymosis is resolving, no swelling, but he does have a slow to heal 3 mm diameter shallow ulcer that is being irritated by a small bony prominence at his great toe.      LE Pulses LEFT RIGHT   FEMORAL 2+ palpable 2+ palpable    POPLITEAL not palpable  not palpable   POSTERIOR TIBIAL not palpable  not  palpable    DORSALIS PEDIS  ANTERIOR TIBIAL not palpable  not palpable    PERONEAL not Palpable   not Palpable    Abdomen: soft, NT, no palpable masses. Skin: no rashes. Musculoskeletal: no muscle wasting or atrophy. Neurologic: A&O X 3; Appropriate Affect ; SENSATION: normal; MOTOR FUNCTION: moving all extremities equally, motor strength 5/5 throughout. Speech is fluent/normal. CN 2-12 intact.                 ASSESSMENT: Ryan [name] is a 70 y.o. 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 ABI: moderate arterial occlusive disease in the right LE with monophasic waveforms, normal in the left LE with biphasic waveforms. He stubbed his right 2nd toe about a month ago. Right second toe ecchymosis is resolving, no swelling, but he does have a slow to heal 3 mm diameter shallow ulcer that is being irritated by a small bony prominence at his great toe. This areas was dressed, mainly for protection from the small bony prominence of the adjacent great toe. Pt instructed to keep  a protective dressing on this to facilitate healing.  He takes a statin and coumadin, Fortunately he does not have DM and has never uses tobacco.  I spoke with Ovid Curd, a nurse in Dr. Macky Lower office, re pt's asymptomatic regular tachycardia rate of 116-120. He stated that he will consult one the cardiologist and advise the pt.   PLAN:  Based on the patient's vascular studies and examination, pt will return to clinic in 1 year with ABI's.  Continue graduated walking program.  I discussed in depth with the patient the nature of atherosclerosis, and emphasized the importance of maximal medical management including strict control of blood pressure, blood glucose, and lipid levels, obtaining regular exercise, and continued cessation of smoking.  The patient is aware that without maximal medical management the underlying  atherosclerotic disease process will progress, limiting the benefit of any interventions.  The patient was given information about PAD including signs, symptoms, treatment, what symptoms should prompt the patient to seek immediate medical care, and risk reduction measures to take.  Clemon Chambers, RN, MSN, FNP-C [facility] Phone: [phone]  Clinic MD: Oneida Alar  03/18/2016 4:11 PM

## 2016-03-18 NOTE — Telephone Encounter (Signed)
Spoke with Clemon Chambers, NP who saw patient today at [facility].  This was palpated HR, their office does not have EKG capabilities so no EKG to review. Attempted to reach patient and lmtcb

## 2016-04-06 DIAGNOSIS — E7800 Pure hypercholesterolemia, unspecified: Secondary | ICD-10-CM | POA: Diagnosis not present

## 2016-04-06 DIAGNOSIS — J309 Allergic rhinitis, unspecified: Secondary | ICD-10-CM | POA: Diagnosis not present

## 2016-04-06 DIAGNOSIS — I498 Other specified cardiac arrhythmias: Secondary | ICD-10-CM | POA: Diagnosis not present

## 2016-04-06 DIAGNOSIS — N529 Male erectile dysfunction, unspecified: Secondary | ICD-10-CM | POA: Diagnosis not present

## 2016-04-06 DIAGNOSIS — Z7901 Long term (current) use of anticoagulants: Secondary | ICD-10-CM | POA: Diagnosis not present

## 2016-04-06 DIAGNOSIS — Z954 Presence of other heart-valve replacement: Secondary | ICD-10-CM | POA: Diagnosis not present

## 2016-04-06 DIAGNOSIS — E876 Hypokalemia: Secondary | ICD-10-CM | POA: Diagnosis not present

## 2016-04-06 DIAGNOSIS — K219 Gastro-esophageal reflux disease without esophagitis: Secondary | ICD-10-CM | POA: Diagnosis not present

## 2016-04-06 DIAGNOSIS — M109 Gout, unspecified: Secondary | ICD-10-CM | POA: Diagnosis not present

## 2016-04-14 ENCOUNTER — Ambulatory Visit (INDEPENDENT_AMBULATORY_CARE_PROVIDER_SITE_OTHER): Payer: Commercial Managed Care - HMO | Admitting: Cardiology

## 2016-04-14 ENCOUNTER — Encounter (INDEPENDENT_AMBULATORY_CARE_PROVIDER_SITE_OTHER): Payer: Self-pay

## 2016-04-14 ENCOUNTER — Encounter: Payer: Self-pay | Admitting: Cardiology

## 2016-04-14 VITALS — BP 140/84 | HR 78 | Ht 72.0 in | Wt 180.0 lb

## 2016-04-14 DIAGNOSIS — I493 Ventricular premature depolarization: Secondary | ICD-10-CM | POA: Diagnosis not present

## 2016-04-14 NOTE — Addendum Note (Signed)
Addended by: Stanton Kidney on: 04/14/2016 04:26 PM   Modules accepted: Orders

## 2016-04-14 NOTE — Patient Instructions (Signed)
Medication Instructions:    Your physician recommends that you continue on your current medications as directed. Please refer to the Current Medication list given to you today.  --- If you need a refill on your cardiac medications before your next appointment, please call your pharmacy. ---  Labwork:  None ordered  Testing/Procedures: Your physician has requested that you have an echocardiogram. Echocardiography is a painless test that uses sound waves to create images of your heart. It provides your doctor with information about the size and shape of your heart and how well your heart's chambers and valves are working. This procedure takes approximately one hour. There are no restrictions for this procedure.  Follow-Up:  Your physician recommends that you schedule a follow-up appointment in: 3 months with Dr. [name].  Thank you for choosing [facility]!!   Trinidad Curet, RN [phone]

## 2016-04-14 NOTE — Progress Notes (Signed)
Electrophysiology Office Note   Date:  04/14/2016   ID:  Ryan [name], Ryan [name] Sep 07, 1946, MRN [id number]  PCP:  [name], MD  Cardiologist:  Pernell Dupre Primary Electrophysiologist:  Constance Haw, MD    Chief Complaint  Patient presents with  . Follow-up    3 months/PVC's     History of Present Illness: Ryan [name] is a 70 y.o. male who presents today for electrophysiology evaluation.   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 Last seen by Dr. Tamala Julian 9/16.  Recently admitted 12/9-12/10 with AKI in the setting of paroxysmal AF with RVR. Beta blocker dose was adjusted. He was hydrated with improved creatinine prior to discharge.    he has been having a high volume of PVCs. His most recent monitor showed that his PVC burden was up to 20%. He had an echocardiogram showed an EF of 30-35%, which could be coming from his PVCs. He was put on [name] see if we can decrease his PVCs which may help his low EF. Since then, he has done well without any major complaints. He has had one or 2 episodes of dizziness, mainly when he has been walking in the heat.  Today, he denies symptoms of palpitations, chest pain, orthopnea, PND, lower extremity edema, claudication, dizziness, presyncope, syncope, bleeding, or neurologic sequela. The patient is tolerating medications without difficulties and is otherwise without complaint today.    Past Medical History:  Diagnosis Date  . Atrial fibrillation ([place])   . Diabetes mellitus without complication ([place])   . Endocarditis   . Hypertension   . Peripheral vascular disease [facility])    Past Surgical History:  Procedure Laterality Date  . ABDOMINAL AORTAGRAM N/A 10/03/2012   Procedure: ABDOMINAL Maxcine Ham;  Surgeon: [name], MD;  Location: [facility] CATH LAB;  Service: Cardiovascular;  Laterality: N/A;  . CORONARY ANGIOGRAM  09/21/2012   Procedure: CORONARY ANGIOGRAM;  Surgeon: [name], MD;  Location: [facility] CATH LAB;  Service: Cardiovascular;;  . EXTREMITY WIRE/PIN REMOVAL  09/14/2012   Procedure: REMOVAL K-WIRE/PIN EXTREMITY;  Surgeon: [name], MD;  Location: [place];  Service: Orthopedics;  Laterality: Right;  Right Foot  . I&D EXTREMITY  09/14/2012   Procedure: IRRIGATION AND DEBRIDEMENT EXTREMITY;  Surgeon: Tennis Must, MD;  Location: [place];  Service: Orthopedics;  Laterality: Right;  . INTRAOPERATIVE TRANSESOPHAGEAL ECHOCARDIOGRAM  09/26/2012   Procedure: INTRAOPERATIVE TRANSESOPHAGEAL ECHOCARDIOGRAM;  Surgeon: [name], MD;  Location: [facility] OR;  Service: Open Heart Surgery;  Laterality: N/A;  . MITRAL VALVE REPLACEMENT  09/26/2012   Procedure: MITRAL VALVE (MV) REPLACEMENT;  Surgeon: [name], MD;  Location: [place] OR;  Service: Open Heart Surgery;  Laterality: N/A;  . RIGHT HEART CATHETERIZATION  09/21/2012   Procedure: RIGHT HEART CATH;  Surgeon: [name], MD;  Location: [facility] CATH LAB;  Service: Cardiovascular;;  . TEE WITHOUT CARDIOVERSION  09/18/2012   Procedure: TRANSESOPHAGEAL ECHOCARDIOGRAM (TEE);  Surgeon: [name], MD;  Location: [facility] ENDOSCOPY;  Service: Cardiovascular;  Laterality: N/A;     Current Outpatient Prescriptions  Medication Sig Dispense Refill  . atorvastatin (LIPITOR) 40 MG tablet Take 1 tablet (40 mg total) by mouth daily at 6 PM. 30 tablet 5  . HYDROcodone-acetaminophen (NORCO/VICODIN) 5-325 MG per tablet Take 1 tablet by mouth every 6 (six) hours as needed for moderate pain.    . pantoprazole (PROTONIX) 40  MG tablet Take 1 tablet by mouth daily.    . potassium chloride SA (K-DUR,KLOR-CON) 20 MEQ tablet Take 20 mEq by mouth once.    . ranolazine ([name]) 500 MG 12 hr tablet Take 1 tablet (500 mg total) by mouth 2 (two) times daily. 60 tablet 3  . traMADol (ULTRAM) 50 MG tablet Take 50 mg by mouth as needed. Reported on 03/18/2016    . triamcinolone cream (KENALOG) 0.5 % Apply  1 application topically as needed (SKIN).     . valsartan (DIOVAN) 40 MG tablet Take 1 tablet (40 mg total) by mouth daily. 30 tablet 0  . warfarin (COUMADIN) 6 MG tablet Take 6 mg by mouth every other day , alternating with 9 mg by mouth on opposite days.  1   No current facility-administered medications for this visit.     Allergies:   [name] allergy]   Social History:  The patient  reports that he has never smoked. He has never used smokeless tobacco. He reports that he does not drink alcohol or use drugs.   Family History:  The patient's family history includes Hypertension in his father and mother.    ROS:  Please see the history of present illness.   All other systems are reviewed and negative.    PHYSICAL EXAM: VS:  BP 140/84   Pulse 78   Ht 6' (1.829 m)   Wt 180 lb (81.6 kg)   BMI 24.41 kg/m  , BMI Body mass index is 24.41 kg/m. GEN: Well nourished, well developed, in no acute distress  HEENT: normal  Neck: no JVD, carotid bruits, or masses Cardiac: RRR; no murmurs, rubs, or gallops,no edema  Respiratory:  clear to auscultation bilaterally, normal work of breathing GI: soft, nontender, nondistended, + BS MS: no deformity or atrophy  Skin: warm and dry Neuro:  Strength and sensation are intact Psych: euthymic mood, full affect  EKG:  EKG is not ordered today.   Recent Labs: 08/29/2015: B Natriuretic Peptide 333.2; Hemoglobin 13.2; Platelets 153 08/30/2015: BUN 15; Creatinine, Ser 1.35; Magnesium 2.0; Potassium 4.1; Sodium 144; TSH 1.779    Lipid Panel     Component Value Date/Time   CHOL 121 09/16/2012 0520   TRIG 174 (H) 09/16/2012 0520   HDL 6 (L) 09/16/2012 0520   CHOLHDL 20.2 09/16/2012 0520   VLDL 35 09/16/2012 0520   LDLCALC 80 09/16/2012 0520     Wt Readings from Last 3 Encounters:  04/14/16 180 lb (81.6 kg)  03/18/16 175 lb (79.4 kg)  12/26/15 180 lb (81.6 kg)      Other studies Reviewed: Additional studies/ records that were reviewed  today include: 48 hour monitor  Review of the above records today demonstrates:  Minimum: 59 bpm at 9:31 PM Mean: 88 bpm mV Maximum: 134 bpm at 9:44 AM Ventricular ectopy 22.08% of beats Supraventricular events 0.92% of beats Short runs of SVT  TTE 12/03/15 - Normal LV size with EF 30-35%, diffuse hypokinesis. Normal RV   size and systolic function. Bioprosthetic mitral valve appeared   to function normally. Mildly dilated aortic root. Biatrial   enlargement.  ASSESSMENT AND PLAN:  1. Junctional Rhythm - No junctional rhythm noted on his heart monitor. We'll continue to follow. Starting amiodarone today for his PVCs, we'll continue to monitor for further signs of a junctional rhythm.  2. PAF - CHADS2-VASc=3. Continue Coumadin.  3. 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  EF is currently decreased to 30-35% which  could be due to PVC burden.  He was started on [name] at his previous visit. On exam today, he does not appear to have any PVCs. We will get an echocardiogram in the middle of September and follow him up for potential further management with ablation.  4. S/p MVR - Bioprosthetic. Continue SBE prophylaxis.    Current medicines are reviewed at length with the patient today.   The patient does not have concerns regarding his medicines.  The following changes were made today:  Amiodarone  Labs/ tests ordered today include:  No orders of the defined types were placed in this encounter.    Disposition:   FU with Will Camnitz 6 months  Signed, Will Meredith Leeds, MD  04/14/2016 4:05 PM     [address] [place] [id number] [phone] (office) [phone] (fax)

## 2016-04-15 ENCOUNTER — Other Ambulatory Visit: Payer: Self-pay | Admitting: Cardiology

## 2016-04-26 ENCOUNTER — Encounter (HOSPITAL_COMMUNITY): Payer: Self-pay

## 2016-04-26 ENCOUNTER — Ambulatory Visit (HOSPITAL_COMMUNITY): Payer: Commercial Managed Care - HMO | Attending: Cardiovascular Disease

## 2016-04-26 ENCOUNTER — Other Ambulatory Visit (INDEPENDENT_AMBULATORY_CARE_PROVIDER_SITE_OTHER): Payer: Commercial Managed Care - HMO

## 2016-04-26 ENCOUNTER — Other Ambulatory Visit: Payer: Self-pay

## 2016-04-26 DIAGNOSIS — E119 Type 2 diabetes mellitus without complications: Secondary | ICD-10-CM | POA: Insufficient documentation

## 2016-04-26 DIAGNOSIS — I493 Ventricular premature depolarization: Secondary | ICD-10-CM | POA: Insufficient documentation

## 2016-04-26 DIAGNOSIS — I471 Supraventricular tachycardia: Secondary | ICD-10-CM | POA: Diagnosis not present

## 2016-04-26 DIAGNOSIS — I38 Endocarditis, valve unspecified: Secondary | ICD-10-CM | POA: Diagnosis not present

## 2016-04-26 DIAGNOSIS — I480 Paroxysmal atrial fibrillation: Secondary | ICD-10-CM | POA: Diagnosis not present

## 2016-04-26 DIAGNOSIS — I119 Hypertensive heart disease without heart failure: Secondary | ICD-10-CM | POA: Diagnosis not present

## 2016-04-26 NOTE — Progress Notes (Signed)
Patient ID: Ryan [name], male   DOB: [birth year], 70 y.o.   MRN: [id number]  Ryan [name] presented for echocardiogram this afternoon. His heart rate was ~120bpm. Dr. [name] was notified and he ordered an EKG and had him scheduled for a visit 05/12/16 at 9:15am.

## 2016-05-06 ENCOUNTER — Other Ambulatory Visit: Payer: Self-pay | Admitting: *Deleted

## 2016-05-06 MED ORDER — METOPROLOL SUCCINATE ER 25 MG PO TB24: ORAL_TABLET | ORAL | 11 refills | Status: DC

## 2016-05-07 DIAGNOSIS — I498 Other specified cardiac arrhythmias: Secondary | ICD-10-CM | POA: Diagnosis not present

## 2016-05-07 DIAGNOSIS — I10 Essential (primary) hypertension: Secondary | ICD-10-CM | POA: Diagnosis not present

## 2016-05-07 DIAGNOSIS — K219 Gastro-esophageal reflux disease without esophagitis: Secondary | ICD-10-CM | POA: Diagnosis not present

## 2016-05-07 DIAGNOSIS — E7800 Pure hypercholesterolemia, unspecified: Secondary | ICD-10-CM | POA: Diagnosis not present

## 2016-05-07 DIAGNOSIS — E876 Hypokalemia: Secondary | ICD-10-CM | POA: Diagnosis not present

## 2016-05-07 DIAGNOSIS — M109 Gout, unspecified: Secondary | ICD-10-CM | POA: Diagnosis not present

## 2016-05-07 DIAGNOSIS — Z7901 Long term (current) use of anticoagulants: Secondary | ICD-10-CM | POA: Diagnosis not present

## 2016-05-07 DIAGNOSIS — J309 Allergic rhinitis, unspecified: Secondary | ICD-10-CM | POA: Diagnosis not present

## 2016-05-07 DIAGNOSIS — E119 Type 2 diabetes mellitus without complications: Secondary | ICD-10-CM | POA: Diagnosis not present

## 2016-05-07 DIAGNOSIS — N529 Male erectile dysfunction, unspecified: Secondary | ICD-10-CM | POA: Diagnosis not present

## 2016-05-11 NOTE — Progress Notes (Signed)
Electrophysiology Office Note   Date:  05/12/2016   ID:  Shadrach, Ghafoor [birth year], MRN [id number]  PCP:  [name], MD  Cardiologist:  Pernell Dupre Primary Electrophysiologist:  Constance Haw, MD    Chief Complaint  Patient presents with  . Follow-up    post echo     History of Present Illness: KARDIER FRINK is a 70 y.o. male who presents today for electrophysiology evaluation.   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   He has been having a high volume of PVCs. His most recent monitor showed that his PVC burden was up to 20%. He had an echocardiogram showed an EF of 30-35%, which could be coming from his PVCs. He was put on [name] see if we can decrease his PVCs which may help his low EF. He presents today complaining of significant weakness and some shortness of breath. He says that since his metoprolol was started, he has had difficulties getting around. He says that his heart rate, at home, has been very low. When he came in for his most recent echo, his heart rate was in the 110s which appeared to be due to a junctional rhythm. 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  Today, he denies symptoms of palpitations, chest pain, orthopnea, PND, lower extremity edema, claudication, dizziness, presyncope, syncope, bleeding, or neurologic sequela. The patient is tolerating medications without difficulties and is otherwise without complaint today.    Past Medical History:  Diagnosis Date  . Atrial fibrillation ([place])   . Diabetes mellitus without complication ([place])   . Endocarditis   . Hypertension   . Peripheral vascular disease [facility])    Past Surgical History:  Procedure Laterality Date  . ABDOMINAL AORTAGRAM N/A 10/03/2012   Procedure: ABDOMINAL Maxcine Ham;  Surgeon: [name], MD;   Location: [facility] CATH LAB;  Service: Cardiovascular;  Laterality: N/A;  . CORONARY ANGIOGRAM  09/21/2012   Procedure: CORONARY ANGIOGRAM;  Surgeon: [name], MD;  Location: [facility] CATH LAB;  Service: Cardiovascular;;  . EXTREMITY WIRE/PIN REMOVAL  09/14/2012   Procedure: REMOVAL K-WIRE/PIN EXTREMITY;  Surgeon: [name], MD;  Location: [place];  Service: Orthopedics;  Laterality: Right;  Right Foot  . I&D EXTREMITY  09/14/2012   Procedure: IRRIGATION AND DEBRIDEMENT EXTREMITY;  Surgeon: Tennis Must, MD;  Location: [place];  Service: Orthopedics;  Laterality: Right;  . INTRAOPERATIVE TRANSESOPHAGEAL ECHOCARDIOGRAM  09/26/2012   Procedure: INTRAOPERATIVE TRANSESOPHAGEAL ECHOCARDIOGRAM;  Surgeon: [name], MD;  Location: [facility] OR;  Service: Open Heart Surgery;  Laterality: N/A;  . MITRAL VALVE REPLACEMENT  09/26/2012   Procedure: MITRAL VALVE (MV) REPLACEMENT;  Surgeon: [name], MD;  Location: [place] OR;  Service: Open Heart Surgery;  Laterality: N/A;  . RIGHT HEART CATHETERIZATION  09/21/2012   Procedure: RIGHT HEART CATH;  Surgeon: [name], MD;  Location: [facility] CATH LAB;  Service: Cardiovascular;;  . TEE WITHOUT CARDIOVERSION  09/18/2012   Procedure: TRANSESOPHAGEAL ECHOCARDIOGRAM (TEE);  Surgeon: [name], MD;  Location: [facility] ENDOSCOPY;  Service: Cardiovascular;  Laterality: N/A;     Current Outpatient Prescriptions  Medication Sig Dispense Refill  . atorvastatin (LIPITOR) 40 MG tablet Take 1 tablet (40 mg total) by mouth daily at 6 PM. 30 tablet 5  . HYDROcodone-acetaminophen (NORCO/VICODIN) 5-325 MG per tablet Take 1  tablet by mouth every 6 (six) hours as needed for moderate pain.    . metoprolol succinate (TOPROL XL) 25 MG 24 hr tablet Take 2 tablets ( total of 50 mg) by mouth once daily 60 tablet 11  . pantoprazole (PROTONIX) 40 MG tablet Take 1 tablet by mouth daily.    . potassium chloride SA (K-DUR,KLOR-CON) 20 MEQ tablet Take 20 mEq by mouth once.    Marland Kitchen [name] 500 MG 12 hr  tablet TAKE 1 TABLET (500 MG TOTAL) BY MOUTH 2 (TWO) TIMES DAILY. 60 tablet 3  . traMADol (ULTRAM) 50 MG tablet Take 50 mg by mouth as needed. Reported on 03/18/2016    . triamcinolone cream (KENALOG) 0.5 % Apply 1 application topically as needed (SKIN).     . valsartan (DIOVAN) 40 MG tablet Take 1 tablet (40 mg total) by mouth daily. 30 tablet 0  . warfarin (COUMADIN) 6 MG tablet Take 6 mg by mouth every other day , alternating with 9 mg by mouth on opposite days.  1   No current facility-administered medications for this visit.     Allergies:   [name] allergy]   Social History:  The patient  reports that he has never smoked. He has never used smokeless tobacco. He reports that he does not drink alcohol or use drugs.   Family History:  The patient's family history includes Hypertension in his father and mother.    ROS:  Please see the history of present illness.   All other systems are reviewed and positive for SOB, fatigue.    PHYSICAL EXAM: VS:  BP 140/62   Pulse (!) 40   Ht 6' (1.829 m)   Wt 176 lb 9.6 oz (80.1 kg)   BMI 23.95 kg/m  , BMI Body mass index is 23.95 kg/m. GEN: Well nourished, well developed, in no acute distress  HEENT: normal  Neck: no JVD, carotid bruits, or masses Cardiac: bradycardic, regular; no murmurs, rubs, or gallops,no edema  Respiratory:  clear to auscultation bilaterally, normal work of breathing GI: soft, nontender, nondistended, + BS MS: no deformity or atrophy  Skin: warm and dry Neuro:  Strength and sensation are intact Psych: euthymic mood, full affect  EKG:  EKG is not ordered today.   Recent Labs: 08/29/2015: B Natriuretic Peptide 333.2; Hemoglobin 13.2; Platelets 153 08/30/2015: BUN 15; Creatinine, Ser 1.35; Magnesium 2.0; Potassium 4.1; Sodium 144; TSH 1.779    Lipid Panel     Component Value Date/Time   CHOL 121 09/16/2012 0520   TRIG 174 (H) 09/16/2012 0520   HDL 6 (L) 09/16/2012 0520   CHOLHDL 20.2 09/16/2012 0520   VLDL  35 09/16/2012 0520   LDLCALC 80 09/16/2012 0520     Wt Readings from Last 3 Encounters:  05/12/16 176 lb 9.6 oz (80.1 kg)  04/14/16 180 lb (81.6 kg)  03/18/16 175 lb (79.4 kg)      Other studies Reviewed: Additional studies/ records that were reviewed today include: 48 hour monitor  Review of the above records today demonstrates:  Minimum: 59 bpm at 9:31 PM Mean: 88 bpm mV Maximum: 134 bpm at 9:44 AM Ventricular ectopy 22.08% of beats Supraventricular events 0.92% of beats Short runs of SVT  TTE 04/26/16 - Left ventricle: The cavity size was normal. Systolic function was   moderately to severely reduced. The estimated ejection fraction   was in the range of 30% to 35%. Diffuse hypokinesis, worse in the   septum. - Aortic valve: Transvalvular velocity  was within the normal range.   There was no stenosis. There was no regurgitation. - Mitral valve: A bioprosthesis was present. Pressure half-time: 63   ms. Mean gradient (D): 12 mm Hg. - Left atrium: The atrium was moderately dilated. - Right ventricle: The cavity size was normal. Wall thickness was   normal. Systolic function was moderately reduced. - Tricuspid valve: There was no regurgitation. - Inferior vena cava: The vessel was normal in size. The   respirophasic diameter changes were in the normal range (>= 50%),   consistent with normal central venous pressure.  Impressions:  - Mean gradient across the mitral valve is significantly increased   compared with 12/03/15. 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 Also, the TVI   ratio is 3.4, consistent with pathologic obstruction. 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   ASSESSMENT AND PLAN:  1. Junctional Rhythm - presented for his echo for tachycardia. Was started on Toprol-XL 25 mg twice a day. He is  unfortunately not tolerating this was significant bradycardia. Rollan Roger decrease his Toprol-XL to daily.  2. PAF - CHADS2-VASc=3. Continue Coumadin.  3. PVC: Had 20% PVCs on his heart monitor.  EF is currently decreased to 30-35% which could be due to PVC burden.  He was started on [name] at his previous visit. On exam today, he does not appear to have any PVCs. We Graycen Sadlon get an echocardiogram in the middle of September and follow him up for potential further management with ablation.  4. S/p MVR - Bioprosthetic. Continue SBE prophylaxis.Had an increased pressure half-time across the prosthetic valve, but it was thought that this was due to functional stenosis due to his tachycardia. We'll repeat echocardiogram in the future to see if this improves with an improved heart rate.   5.  congestive heart failure: Possibly due to PVCs, but he does have significant bradycardia which is making it difficult to treat his arrhythmia. Due to his bradycardia, plan on implantation of an ICD. This [place] allow [place] to put him on better medications for his PVCs and heart failure. Risks and benefits of the procedure were discussed. Risks include bleeding, tamponade, infection, and pneumothorax. The patient understands these risks and has agreed to the procedure. He has not been on OMT but with needing a pacemaker, Reighan Hipolito implant an ICD as well as his EF is 30-35%.  Current medicines are reviewed at length with the patient today.   The patient does not have concerns regarding his medicines.  The following changes were made today:  Decrease Toprol XL  Labs/ tests ordered today include:  No orders of the defined types were placed in this encounter.    Disposition:   FU with Sallyann Kinnaird 3 months  Signed, Jazier Mcglamery Meredith Leeds, MD  05/12/2016 9:30 AM     [facility] [address] [place] [address] [phone] (office) [phone] (fax)

## 2016-05-12 ENCOUNTER — Other Ambulatory Visit: Payer: Self-pay

## 2016-05-12 ENCOUNTER — Encounter: Payer: Self-pay | Admitting: Cardiology

## 2016-05-12 ENCOUNTER — Encounter (INDEPENDENT_AMBULATORY_CARE_PROVIDER_SITE_OTHER): Payer: Self-pay

## 2016-05-12 ENCOUNTER — Ambulatory Visit (INDEPENDENT_AMBULATORY_CARE_PROVIDER_SITE_OTHER): Payer: Commercial Managed Care - HMO | Admitting: Cardiology

## 2016-05-12 VITALS — BP 140/62 | HR 40 | Ht 72.0 in | Wt 176.6 lb

## 2016-05-12 DIAGNOSIS — I5022 Chronic systolic (congestive) heart failure: Secondary | ICD-10-CM | POA: Diagnosis not present

## 2016-05-12 NOTE — Telephone Encounter (Signed)
Error wrong chart

## 2016-05-12 NOTE — Patient Instructions (Signed)
Medication Instructions:    Your physician recommends that you continue on your current medications as directed. Please refer to the Current Medication list given to you today.  --- If you need a refill on your cardiac medications before your next appointment, please call your pharmacy. ---  Labwork:  None ordered  Testing/Procedures: Your physician has recommended that you have a defibrillator inserted. An implantable cardioverter defibrillator (ICD) is a small device that is placed in your chest or, in rare cases, your abdomen. This device uses electrical pulses or shocks to help control life-threatening, irregular heartbeats that could lead the heart to suddenly stop beating (sudden cardiac arrest). Leads are attached to the ICD that goes into your heart. This is done in the hospital and usually requires an overnight stay.   Please call Trinidad Curet, RN when you are ready to schedule this procedure.  Available dates (these are subject to change): 9/7, 9/11, 9/22, 9/28  Follow-Up:  To be determined once procedure is scheduled.  Thank you for choosing [facility]!!   Trinidad Curet, RN [phone]  Any Other Special Instructions Will Be Listed Below (If Applicable).   Cardioverter Defibrillator Implantation An implantable cardioverter defibrillator (ICD) is a small, lightweight, battery-powered device that is placed (implanted) under the skin in the chest or abdomen. Your caregiver may prescribe an ICD if:  You have had an irregular heart rhythm (arrhythmia) that originated in the lower chambers of the heart (ventricles).  Your heart has been damaged by a disease (such as coronary artery disease) or heart condition (such as a heart attack). An ICD consists of a battery that lasts several years, a small computer called a pulse generator, and wires called leads that go into the heart. It is used to detect and correct two dangerous arrhythmias: a rapid heart rhythm (tachycardia)  and an arrhythmia in which the ventricles contract in an uncoordinated way (fibrillation). When an ICD detects tachycardia, it sends an electrical signal to the heart that restores the heartbeat to normal (cardioversion). This signal is usually painless. If cardioversion does not work or if the ICD detects fibrillation, it delivers a small electrical shock to the heart (defibrillation) to restart the heart. The shock may feel like a strong jolt in the chest.ICDs may be programmed to correct other problems. Sometimes, ICDs are programmed to act as another type of implantable device called a pacemaker. Pacemakers are used to treat a slow heartbeat (bradycardia). LET YOUR CAREGIVER KNOW ABOUT:  Any allergies you have.  All medicines you are taking, including vitamins, herbs, eyedrops, and over-the-counter medicines and creams.  Previous problems you or members of your family have had with the use of anesthetics.  Any blood disorders you have had.  Other health problems you have. RISKS AND COMPLICATIONS Generally, the procedure to implant an ICD is safe. However, as with any surgical procedure, complications can occur. Possible complications associated with implanting an ICD include:  Swelling, bleeding, or bruising at the site where the ICD was implanted.  Infection at the site where the ICD was implanted.  A reaction to medicine used during the procedure.  Nerve, heart, or blood vessel damage.  Blood clots. BEFORE THE PROCEDURE  You may need to have blood tests, heart tests, or a chest X-ray done before the day of the procedure.  Ask your caregiver about changing or stopping your regular medicines.  Make plans to have someone drive you home. You may need to stay in the hospital overnight after the procedure.  Stop smoking at least 24 hours before the procedure.  Take a bath or shower the night before the procedure. You may need to scrub your chest or abdomen with a special type of  soap.  Do not eat or drink before your procedure for as long as directed by your caregiver. Ask if it is okay to take any needed medicine with a small sip of water. PROCEDURE  The procedure to implant an ICD in your chest or abdomen is usually done at a hospital in a room that has a large X-ray machine called a fluoroscope. The machine will be above you during the procedure. It will help your caregiver see your heart during the procedure. Implanting an ICD usually takes 1-3 hours. Before the procedure:   Small monitors will be put on your body. They will be used to check your heart, blood pressure, and oxygen level.  A needle will be put into a vein in your hand or arm. This is called an intravenous (IV) access tube. Fluids and medicine will flow directly into your body through the IV tube.  Your chest or abdomen will be cleaned with a germ-killing (antiseptic) solution. The area may be shaved.  You may be given medicine to help you relax (sedative).  You will be given a medicine called a local anesthetic. This medicine will make the surgical site numb while the ICD is implanted. You will be sleepy but awake during the procedure. After you are numb the procedure will begin. The caregiver will:  Make a small cut (incision). This will make a pocket deep under your skin that will hold the pulse generator.  Guide the leads through a large blood vessel into your heart and attach them to the heart muscles. Depending on the ICD, the leads may go into one ventricle or they may go to both ventricles and into an upper chamber of the heart (atrium).  Test the ICD.  Close the incision with stitches, glue, or staples. AFTER THE PROCEDURE  You may feel pain. Some pain is normal. It may last a few days.  You may stay in a recovery area until the local anesthetic has worn off. Your blood pressure and pulse will be checked often. You will be taken to a room where your heart will be monitored.  A chest  X-ray will be taken. This is done to check that the cardioverter defibrillator is in the right place.  You may stay in the hospital overnight.  A slight bump may be seen over the skin where the ICD was placed. Sometimes, it is possible to feel the ICD under the skin. This is normal.  In the months and years afterward, your caregiver will check the device, the leads, and the battery every few months. Eventually, when the battery is low, the ICD will be replaced.   This information is not intended to replace advice given to you by your health care provider. Make sure you discuss any questions you have with your health care provider.   Document Released: 05/29/2002 Document Revised: 06/27/2013 Document Reviewed: 09/25/2012 Elsevier Interactive Patient Education [employer].

## 2016-05-26 ENCOUNTER — Telehealth: Payer: Self-pay | Admitting: Cardiology

## 2016-05-26 ENCOUNTER — Encounter: Payer: Self-pay | Admitting: Cardiology

## 2016-05-26 DIAGNOSIS — Z01812 Encounter for preprocedural laboratory examination: Secondary | ICD-10-CM

## 2016-05-26 DIAGNOSIS — I5022 Chronic systolic (congestive) heart failure: Secondary | ICD-10-CM

## 2016-05-26 NOTE — Telephone Encounter (Signed)
Pt calling with more more questions re defib implant, also wants to know if the 22nd of October still available-pls call

## 2016-05-27 NOTE — Telephone Encounter (Signed)
Follow Up: ° ° ° °Returning your call from yesterday. °

## 2016-05-28 NOTE — Telephone Encounter (Signed)
F/u Message ° °Pt returning RN call. Please call back to discuss  °

## 2016-05-28 NOTE — Telephone Encounter (Signed)
lmtcb to review instructions for ICD implant scheduled for 9/22

## 2016-05-31 ENCOUNTER — Telehealth: Payer: Self-pay | Admitting: Cardiology

## 2016-05-31 ENCOUNTER — Encounter: Payer: Self-pay | Admitting: *Deleted

## 2016-05-31 NOTE — Telephone Encounter (Signed)
Advised patient ok to come in for labs tomorrow.  Informed him I would arrange for him to come by office tomorrow for pre procedure labs.  Patient thanks me for helping.

## 2016-05-31 NOTE — Telephone Encounter (Signed)
F/u Message  Pt call requesting to speak with RN. Pt states he a was to have lab work completed today, but will not be able to make it. Pt wants to know if he would be able to come for labs in the morning. I did not see an appt for labs. Please call back to discuss

## 2016-05-31 NOTE — Telephone Encounter (Signed)
ICD implant scheduled for 9/22. Pre procedure labs today. Letter of instructions reviewed with patient and left at front desk for him to pick up.  Surgical scrub instructions also reviewed and left at front desk. Post implant f/u appts scheduled. Patient verbalized understanding and agreeable to plan.

## 2016-06-01 ENCOUNTER — Other Ambulatory Visit (INDEPENDENT_AMBULATORY_CARE_PROVIDER_SITE_OTHER): Payer: Commercial Managed Care - HMO

## 2016-06-01 DIAGNOSIS — I5022 Chronic systolic (congestive) heart failure: Secondary | ICD-10-CM | POA: Diagnosis not present

## 2016-06-01 DIAGNOSIS — Z01812 Encounter for preprocedural laboratory examination: Secondary | ICD-10-CM

## 2016-06-02 LAB — CBC WITH DIFFERENTIAL/PLATELET
Basophils Absolute: 47 cells/uL (ref 0–200)
Basophils Relative: 1 %
Eosinophils Absolute: 141 cells/uL (ref 15–500)
Eosinophils Relative: 3 %
HCT: 41.2 % (ref 38.5–50.0)
Hemoglobin: 13.1 g/dL — ABNORMAL LOW (ref 13.2–17.1)
Lymphocytes Relative: 29 %
Lymphs Abs: 1363 cells/uL (ref 850–3900)
MCH: 23.1 pg — ABNORMAL LOW (ref 27.0–33.0)
MCHC: 31.8 g/dL — ABNORMAL LOW (ref 32.0–36.0)
MCV: 72.7 fL — ABNORMAL LOW (ref 80.0–100.0)
Monocytes Absolute: 517 cells/uL (ref 200–950)
Monocytes Relative: 11 %
Neutro Abs: 2632 cells/uL (ref 1500–7800)
Neutrophils Relative %: 56 %
Platelets: 218 10*3/uL (ref 140–400)
RBC: 5.67 MIL/uL (ref 4.20–5.80)
RDW: 17.6 % — ABNORMAL HIGH (ref 11.0–15.0)
WBC: 4.7 10*3/uL (ref 3.8–10.8)

## 2016-06-02 LAB — BASIC METABOLIC PANEL
BUN: 17 mg/dL (ref 7–25)
CO2: 24 mmol/L (ref 20–31)
Calcium: 9.5 mg/dL (ref 8.6–10.3)
Chloride: 108 mmol/L (ref 98–110)
Creat: 1.17 mg/dL (ref 0.70–1.18)
Glucose, Bld: 85 mg/dL (ref 65–99)
Potassium: 4.5 mmol/L (ref 3.5–5.3)
Sodium: 142 mmol/L (ref 135–146)

## 2016-06-07 DIAGNOSIS — K219 Gastro-esophageal reflux disease without esophagitis: Secondary | ICD-10-CM | POA: Diagnosis not present

## 2016-06-07 DIAGNOSIS — R791 Abnormal coagulation profile: Secondary | ICD-10-CM | POA: Diagnosis not present

## 2016-06-07 DIAGNOSIS — E876 Hypokalemia: Secondary | ICD-10-CM | POA: Diagnosis not present

## 2016-06-07 DIAGNOSIS — M159 Polyosteoarthritis, unspecified: Secondary | ICD-10-CM | POA: Diagnosis not present

## 2016-06-07 DIAGNOSIS — E7800 Pure hypercholesterolemia, unspecified: Secondary | ICD-10-CM | POA: Diagnosis not present

## 2016-06-07 DIAGNOSIS — N529 Male erectile dysfunction, unspecified: Secondary | ICD-10-CM | POA: Diagnosis not present

## 2016-06-07 DIAGNOSIS — I498 Other specified cardiac arrhythmias: Secondary | ICD-10-CM | POA: Diagnosis not present

## 2016-06-07 DIAGNOSIS — J309 Allergic rhinitis, unspecified: Secondary | ICD-10-CM | POA: Diagnosis not present

## 2016-06-07 DIAGNOSIS — M109 Gout, unspecified: Secondary | ICD-10-CM | POA: Diagnosis not present

## 2016-06-11 ENCOUNTER — Ambulatory Visit (HOSPITAL_COMMUNITY)
Admission: RE | Admit: 2016-06-11 | Discharge: 2016-06-12 | Disposition: A | Discharge status: Home / Self Care | Payer: Commercial Managed Care - HMO | Source: Ambulatory Visit | Attending: Cardiology | Admitting: Cardiology

## 2016-06-11 ENCOUNTER — Encounter (HOSPITAL_COMMUNITY)
Admission: RE | Disposition: A | Discharge status: Home / Self Care | Payer: Self-pay | Source: Ambulatory Visit | Attending: Cardiology

## 2016-06-11 DIAGNOSIS — I471 Supraventricular tachycardia: Secondary | ICD-10-CM | POA: Diagnosis present

## 2016-06-11 DIAGNOSIS — I5032 Chronic diastolic (congestive) heart failure: Secondary | ICD-10-CM | POA: Insufficient documentation

## 2016-06-11 DIAGNOSIS — I110 Hypertensive heart disease with heart failure: Secondary | ICD-10-CM | POA: Diagnosis not present

## 2016-06-11 DIAGNOSIS — E1151 Type 2 diabetes mellitus with diabetic peripheral angiopathy without gangrene: Secondary | ICD-10-CM | POA: Diagnosis not present

## 2016-06-11 DIAGNOSIS — I493 Ventricular premature depolarization: Secondary | ICD-10-CM | POA: Insufficient documentation

## 2016-06-11 DIAGNOSIS — I2510 Atherosclerotic heart disease of native coronary artery without angina pectoris: Secondary | ICD-10-CM | POA: Diagnosis not present

## 2016-06-11 DIAGNOSIS — Z006 Encounter for examination for normal comparison and control in clinical research program: Secondary | ICD-10-CM | POA: Insufficient documentation

## 2016-06-11 DIAGNOSIS — I5022 Chronic systolic (congestive) heart failure: Secondary | ICD-10-CM

## 2016-06-11 DIAGNOSIS — Z953 Presence of xenogenic heart valve: Secondary | ICD-10-CM | POA: Diagnosis not present

## 2016-06-11 DIAGNOSIS — I429 Cardiomyopathy, unspecified: Secondary | ICD-10-CM

## 2016-06-11 DIAGNOSIS — I255 Ischemic cardiomyopathy: Secondary | ICD-10-CM | POA: Diagnosis present

## 2016-06-11 DIAGNOSIS — I509 Heart failure, unspecified: Secondary | ICD-10-CM

## 2016-06-11 DIAGNOSIS — Z95818 Presence of other cardiac implants and grafts: Secondary | ICD-10-CM

## 2016-06-11 DIAGNOSIS — I420 Dilated cardiomyopathy: Secondary | ICD-10-CM | POA: Insufficient documentation

## 2016-06-11 HISTORY — DX: Supraventricular tachycardia, unspecified: I47.10

## 2016-06-11 HISTORY — DX: Dilated cardiomyopathy: I42.0

## 2016-06-11 HISTORY — DX: Supraventricular tachycardia: I47.1

## 2016-06-11 HISTORY — DX: Ventricular premature depolarization: I49.3

## 2016-06-11 HISTORY — PX: EP IMPLANTABLE DEVICE: SHX172B

## 2016-06-11 LAB — GLUCOSE, CAPILLARY
Glucose-Capillary: 100 mg/dL — ABNORMAL HIGH (ref 65–99)
Glucose-Capillary: 87 mg/dL (ref 65–99)
Glucose-Capillary: 146 mg/dL — ABNORMAL HIGH (ref 65–99)

## 2016-06-11 LAB — PROTIME-INR
INR: 1.84
Prothrombin Time: 21.5 seconds — ABNORMAL HIGH (ref 11.4–15.2)

## 2016-06-11 LAB — MRSA PCR SCREENING: MRSA by PCR: NEGATIVE

## 2016-06-11 SURGERY — ICD IMPLANT
Anesthesia: LOCAL

## 2016-06-11 MED ORDER — AMLODIPINE BESYLATE 5 MG PO TABS
2.5000 mg | ORAL_TABLET | Freq: Every day | ORAL | Status: DC
  Administered 2016-06-12: 2.5 mg via ORAL
  Filled 2016-06-11: qty 1

## 2016-06-11 MED ORDER — CEFAZOLIN IN D5W 1 GM/50ML IV SOLN
1.0000 g | Freq: Four times a day (QID) | INTRAVENOUS | Status: AC
  Administered 2016-06-11 – 2016-06-12 (×3): 1 g via INTRAVENOUS
  Filled 2016-06-11 (×3): qty 50

## 2016-06-11 MED ORDER — MIDAZOLAM HCL 5 MG/5ML IJ SOLN
INTRAMUSCULAR | Status: DC | PRN
  Administered 2016-06-11 (×5): 1 mg via INTRAVENOUS

## 2016-06-11 MED ORDER — ATORVASTATIN CALCIUM 40 MG PO TABS
40.0000 mg | ORAL_TABLET | Freq: Every day | ORAL | Status: DC
  Administered 2016-06-11: 40 mg via ORAL
  Filled 2016-06-11: qty 1

## 2016-06-11 MED ORDER — LIDOCAINE HCL (PF) 1 % IJ SOLN
INTRAMUSCULAR | Status: AC
  Filled 2016-06-11: qty 60

## 2016-06-11 MED ORDER — FENTANYL CITRATE (PF) 100 MCG/2ML IJ SOLN
INTRAMUSCULAR | Status: DC | PRN
  Administered 2016-06-11 (×4): 25 ug via INTRAVENOUS

## 2016-06-11 MED ORDER — ONDANSETRON HCL 4 MG/2ML IJ SOLN: 4.0000 mg | Freq: Four times a day (QID) | INTRAMUSCULAR | Status: DC | PRN

## 2016-06-11 MED ORDER — CEFAZOLIN SODIUM-DEXTROSE 2-4 GM/100ML-% IV SOLN
INTRAVENOUS | Status: AC
  Filled 2016-06-11: qty 100

## 2016-06-11 MED ORDER — IRBESARTAN 150 MG PO TABS
75.0000 mg | ORAL_TABLET | Freq: Every day | ORAL | Status: DC
  Administered 2016-06-12: 75 mg via ORAL
  Filled 2016-06-11: qty 1

## 2016-06-11 MED ORDER — WARFARIN SODIUM 3 MG PO TABS
6.0000 mg | ORAL_TABLET | Freq: Every day | ORAL | Status: DC
  Administered 2016-06-11: 6 mg via ORAL
  Filled 2016-06-11: qty 2

## 2016-06-11 MED ORDER — FENTANYL CITRATE (PF) 100 MCG/2ML IJ SOLN
INTRAMUSCULAR | Status: AC
  Filled 2016-06-11: qty 2

## 2016-06-11 MED ORDER — HYDROCODONE-ACETAMINOPHEN 5-325 MG PO TABS
1.0000 | ORAL_TABLET | Freq: Two times a day (BID) | ORAL | Status: DC | PRN
  Administered 2016-06-11 – 2016-06-12 (×2): 1 via ORAL
  Filled 2016-06-11 (×2): qty 1

## 2016-06-11 MED ORDER — RANOLAZINE ER 500 MG PO TB12
500.0000 mg | ORAL_TABLET | Freq: Two times a day (BID) | ORAL | Status: DC
  Administered 2016-06-11 – 2016-06-12 (×2): 500 mg via ORAL
  Filled 2016-06-11 (×2): qty 1

## 2016-06-11 MED ORDER — SODIUM CHLORIDE 0.9 % IV SOLN
INTRAVENOUS | Status: DC
  Administered 2016-06-11: 10:00:00 via INTRAVENOUS

## 2016-06-11 MED ORDER — HEPARIN (PORCINE) IN NACL 2-0.9 UNIT/ML-% IJ SOLN
INTRAMUSCULAR | Status: DC | PRN
  Administered 2016-06-11: 13:00:00

## 2016-06-11 MED ORDER — CEFAZOLIN SODIUM-DEXTROSE 2-4 GM/100ML-% IV SOLN
2.0000 g | INTRAVENOUS | Status: AC
  Administered 2016-06-11: 2 g via INTRAVENOUS
  Filled 2016-06-11: qty 100

## 2016-06-11 MED ORDER — HEPARIN (PORCINE) IN NACL 2-0.9 UNIT/ML-% IJ SOLN
INTRAMUSCULAR | Status: AC
  Filled 2016-06-11: qty 500

## 2016-06-11 MED ORDER — WARFARIN - PHYSICIAN DOSING INPATIENT: Freq: Every day | Status: DC

## 2016-06-11 MED ORDER — LIDOCAINE HCL (PF) 1 % IJ SOLN
INTRAMUSCULAR | Status: DC | PRN
  Administered 2016-06-11: 48 mL via INTRADERMAL

## 2016-06-11 MED ORDER — SODIUM CHLORIDE 0.9 % IR SOLN
Status: AC
  Filled 2016-06-11: qty 2

## 2016-06-11 MED ORDER — PANTOPRAZOLE SODIUM 40 MG PO TBEC
40.0000 mg | DELAYED_RELEASE_TABLET | Freq: Every day | ORAL | Status: DC
  Administered 2016-06-12: 40 mg via ORAL
  Filled 2016-06-11: qty 1

## 2016-06-11 MED ORDER — SODIUM CHLORIDE 0.9 % IR SOLN
80.0000 mg | Status: AC
  Administered 2016-06-11: 80 mg

## 2016-06-11 MED ORDER — MIDAZOLAM HCL 5 MG/5ML IJ SOLN
INTRAMUSCULAR | Status: AC
  Filled 2016-06-11: qty 5

## 2016-06-11 MED ORDER — POTASSIUM CHLORIDE CRYS ER 20 MEQ PO TBCR
20.0000 meq | EXTENDED_RELEASE_TABLET | Freq: Every day | ORAL | Status: DC
  Administered 2016-06-11 – 2016-06-12 (×2): 20 meq via ORAL
  Filled 2016-06-11 (×2): qty 1

## 2016-06-11 MED ORDER — MUPIROCIN 2 % EX OINT: 1.0000 "application " | TOPICAL_OINTMENT | Freq: Once | CUTANEOUS | Status: DC

## 2016-06-11 MED ORDER — METOPROLOL SUCCINATE ER 50 MG PO TB24
50.0000 mg | ORAL_TABLET | Freq: Every day | ORAL | Status: DC
  Administered 2016-06-12: 50 mg via ORAL
  Filled 2016-06-11: qty 1

## 2016-06-11 MED ORDER — ACETAMINOPHEN 325 MG PO TABS
325.0000 mg | ORAL_TABLET | ORAL | Status: DC | PRN
  Administered 2016-06-11: 650 mg via ORAL
  Filled 2016-06-11: qty 2

## 2016-06-11 MED ORDER — MUPIROCIN 2 % EX OINT
TOPICAL_OINTMENT | CUTANEOUS | Status: AC
  Administered 2016-06-11: 10:00:00
  Filled 2016-06-11: qty 22

## 2016-06-11 SURGICAL SUPPLY — 8 items
CABLE SURGICAL S-101-97-12 (CABLE) ×1 IMPLANT
ICD EVERA DR XT MRI DDMB1D4 (ICD Generator) ×1 IMPLANT
LEAD CAPSURE NOVUS 5076-52CM (Lead) ×1 IMPLANT
LEAD SPRINT QUAT SEC 6935M-62 (Lead) ×1 IMPLANT
PAD DEFIB LIFELINK (PAD) ×1 IMPLANT
SHEATH CLASSIC 7F (SHEATH) ×1 IMPLANT
SHEATH CLASSIC 9F (SHEATH) ×1 IMPLANT
TRAY PACEMAKER INSERTION (PACKS) ×1 IMPLANT

## 2016-06-11 NOTE — H&P (Signed)
Ryan [name] is a 70 y.o. male with a history of nonischemic cardiomyopathy who presents for ICD placement.  He has been on OMT for >3 months and has a persistently decreased LVEF.  Plan for ICD placement today.  Risks and benefits discussed.  Risks include but are not limited to bleeding, infection, tamponade, and pneumothorax. The patient understands these risks and has agreed to the procedure.    [name], MD 06/11/2016 11:50 AM  ICD Criteria  Current LVEF:35-35%. Within 12 months prior to implant: Yes   Heart failure history: Yes, Class II  Cardiomyopathy history: Yes, Non-Ischemic Cardiomyopathy.  Atrial Fibrillation/Atrial Flutter: No.  Ventricular tachycardia history: No.  Cardiac arrest history: No.  History of syndromes with risk of sudden death: No.  Previous ICD: No.  Current ICD indication: Primary  PPM indication: No.   Class I or II Bradycardia indication present: Yes  Beta Blocker therapy for 3 or more months: Yes, prescribed.   Ace Inhibitor/ARB therapy for 3 or more months: Yes, prescribed.

## 2016-06-11 NOTE — Progress Notes (Signed)
Pt arrived from EP lab awake and oriented X 3.  Resting comfortably and waiting for a bed assignment.   DSG lt anterior upper chest D&I.

## 2016-06-11 NOTE — Discharge Instructions (Signed)
° ° °  Supplemental Discharge Instructions for  Pacemaker/Defibrillator Patients  Activity No heavy lifting or vigorous activity with your left/right arm for 6 to 8 weeks.  Do not raise your left/right arm above your head for one week.  Gradually raise your affected arm as drawn below.           __          06/15/16                06/16/16                       06/17/16                  06/18/16  NO DRIVING for  1 week   ; you may begin driving on  8/78/67   .  WOUND CARE - Keep the wound area clean and dry.  Do not get this area wet for one week. No showers for one week; you may shower on 06/18/16    . - The tape/steri-strips on your wound will fall off; do not pull them off.  No bandage is needed on the site.  DO  NOT apply any creams, oils, or ointments to the wound area. - If you notice any drainage or discharge from the wound, any swelling or bruising at the site, or you develop a fever > 101? F after you are discharged home, call the office at once.  Special Instructions - You are still able to use cellular telephones; use the ear opposite the side where you have your pacemaker/defibrillator.  Avoid carrying your cellular phone near your device. - When traveling through airports, show security personnel your identification card to avoid being screened in the metal detectors.  Ask the security personnel to use the hand wand. - Avoid arc welding equipment, MRI testing (magnetic resonance imaging), TENS units (transcutaneous nerve stimulators).  Call the office for questions about other devices. - Avoid electrical appliances that are in poor condition or are not properly grounded. - Microwave ovens are safe to be near or to operate.  Additional information for defibrillator patients should your device go off: - If your device goes off ONCE and you feel fine afterward, notify the device clinic nurses. - If your device goes off ONCE and you do not feel well afterward, call 911. - If your device  goes off TWICE, call 911. - If your device goes off THREE times in one day, call 911.  DO NOT DRIVE YOURSELF OR A FAMILY MEMBER WITH A DEFIBRILLATOR TO THE HOSPITAL--CALL 911.

## 2016-06-12 ENCOUNTER — Encounter (HOSPITAL_COMMUNITY): Payer: Self-pay | Admitting: Radiology

## 2016-06-12 ENCOUNTER — Ambulatory Visit (HOSPITAL_COMMUNITY): Payer: Commercial Managed Care - HMO

## 2016-06-12 DIAGNOSIS — I110 Hypertensive heart disease with heart failure: Secondary | ICD-10-CM | POA: Diagnosis not present

## 2016-06-12 DIAGNOSIS — I493 Ventricular premature depolarization: Secondary | ICD-10-CM | POA: Diagnosis not present

## 2016-06-12 DIAGNOSIS — I5032 Chronic diastolic (congestive) heart failure: Secondary | ICD-10-CM | POA: Diagnosis not present

## 2016-06-12 DIAGNOSIS — I2510 Atherosclerotic heart disease of native coronary artery without angina pectoris: Secondary | ICD-10-CM | POA: Diagnosis not present

## 2016-06-12 DIAGNOSIS — Z953 Presence of xenogenic heart valve: Secondary | ICD-10-CM | POA: Diagnosis not present

## 2016-06-12 DIAGNOSIS — I471 Supraventricular tachycardia: Secondary | ICD-10-CM | POA: Diagnosis not present

## 2016-06-12 DIAGNOSIS — I420 Dilated cardiomyopathy: Secondary | ICD-10-CM

## 2016-06-12 DIAGNOSIS — Z950 Presence of cardiac pacemaker: Secondary | ICD-10-CM | POA: Diagnosis not present

## 2016-06-12 DIAGNOSIS — E1151 Type 2 diabetes mellitus with diabetic peripheral angiopathy without gangrene: Secondary | ICD-10-CM | POA: Diagnosis not present

## 2016-06-12 DIAGNOSIS — Z006 Encounter for examination for normal comparison and control in clinical research program: Secondary | ICD-10-CM | POA: Diagnosis not present

## 2016-06-12 LAB — GLUCOSE, CAPILLARY
Glucose-Capillary: 106 mg/dL — ABNORMAL HIGH (ref 65–99)
Glucose-Capillary: 135 mg/dL — ABNORMAL HIGH (ref 65–99)

## 2016-06-12 MED ORDER — AMIODARONE HCL 200 MG PO TABS
400.0000 mg | ORAL_TABLET | Freq: Two times a day (BID) | ORAL | Status: DC
  Administered 2016-06-12: 400 mg via ORAL
  Filled 2016-06-12: qty 2

## 2016-06-12 MED ORDER — AMIODARONE HCL 400 MG PO TABS: 400.0000 mg | ORAL_TABLET | Freq: Every day | ORAL | 12 refills | Status: DC

## 2016-06-12 MED ORDER — AMIODARONE HCL 400 MG PO TABS: 400.0000 mg | ORAL_TABLET | Freq: Two times a day (BID) | ORAL | 0 refills | Status: DC

## 2016-06-12 MED ORDER — WARFARIN SODIUM 4 MG PO TABS: 4.0000 mg | ORAL_TABLET | Freq: Every day | ORAL | 0 refills | Status: DC

## 2016-06-12 NOTE — Progress Notes (Addendum)
Patient Name: Ryan [name]      SUBJECTIVE:  Admitted overnight following implantation of defibrillator.  He has a history of PVCs (20%) for which he was treated with ranolazine. He also has a history of underlying bradycardia challenging medical therapy.  He has been having episodes of weakness overr the last year which have been assoc with tachypalps  Noted this am to be in SVT x 12 hrs +   He has a complex cardiac history including endocarditis requiring mitral valve replacement.  Past Medical History:  Diagnosis Date  . Atrial fibrillation ([place])   . 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 Diabetes mellitus without complication ([place])   . Endocarditis   . Hypertension   . Peripheral vascular disease ([place])   . PVC's (premature ventricular contractions)     Scheduled Meds:  Scheduled Meds: . amLODipine  2.5 mg Oral Daily  . atorvastatin  40 mg Oral q1800  . irbesartan  75 mg Oral Daily  . metoprolol succinate  50 mg Oral Daily  . pantoprazole  40 mg Oral Daily  . potassium chloride SA  20 mEq Oral Daily  . ranolazine  500 mg Oral BID  . warfarin  6 mg Oral q1800  . Warfarin - Physician Dosing Inpatient   Does not apply q1800   Continuous Infusions:  acetaminophen, HYDROcodone-acetaminophen, ondansetron (ZOFRAN) IV    PHYSICAL EXAM Vitals:   06/11/16 1600 06/11/16 1629 06/11/16 2036 06/12/16 0506  BP: (!) 149/85 (!) 167/78 130/85 137/88  Pulse: 63  (!) 116 (!) 112  Resp: 17  18 18   Temp:  97.9 F (36.6 C) 97.7 F (36.5 C) 98.1 F (36.7 C)  TempSrc:  Oral Oral Oral  SpO2: 99% 99% 99% 99%  Weight:      Height:        Well developed and nourished in no acute distress HENT normal Neck supple with JVP-flat Clear Cartids without bruits  Regular rate and rhythm, no murmurs or gallops Abd-soft with active BS No Clubbing cyanosis edema Skin-warm and dry A & Oriented  Grossly normal sensory and motor function    TELEMETRY: Reviewed telemetry  pt in  Sinus with PVC SVT: long RP     Intake/Output Summary (Last 24 hours) at 06/12/16 0753 Last data filed at 06/12/16 0108  Gross per 24 hour  Intake              100 ml  Output                0 ml  Net              100 ml    LABS: Basic Metabolic Panel: No results for input(s): NA, K, CL, CO2, GLUCOSE, BUN, CREATININE, CALCIUM, MG, PHOS in the last 168 hours. Cardiac Enzymes: No results for input(s): CKTOTAL, CKMB, CKMBINDEX, TROPONINI in the last 72 hours. CBC: No results for input(s): WBC, NEUTROABS, HGB, HCT, MCV, PLT in the last 168 hours. PROTIME:  Recent Labs  06/11/16 0928  LABPROT 21.5*  INR 1.84   Liver Function Tests: No results for input(s): AST, ALT, ALKPHOS, BILITOT, PROT, ALBUMIN in the last 72 hours. No results for input(s): LIPASE, AMYLASE in the last 72 hours. BNP: BNP (last 3 results)  Recent Labs  08/29/15 2107  BNP 333.2*    ProBNP (last 3 results) No results for input(s): PROBNP in the last 8760 hours.  D-Dimer: No results for input(s): DDIMER in  the last 72 hours. Hemoglobin A1C: No results for input(s): HGBA1C in the last 72 hours. Fasting Lipid Panel: No results for input(s): CHOL, HDL, LDLCALC, TRIG, CHOLHDL, LDLDIRECT in the last 72 hours. Thyroid Function Tests: No results for input(s): TSH, T4TOTAL, T3FREE, THYROIDAB in the last 72 hours.  Invalid input(s): FREET3 Anemia Panel: No results for input(s): VITAMINB12, FOLATE, FERRITIN, TIBC, IRON, RETICCTPCT in the last 72 hours.   Device Interrogation: normal device function   Tachycardia terminated with CSM    ASSESSMENT AND PLAN:  Active Problems:   SVT (supraventricular tachycardia)  long RP   CHF (congestive heart failure) (HCC)   Congestive dilated cardiomyopathy ([place])  The pt was in sustained long RP SVT this am that terminated with CSM.  This presents still another mechanism potentially contributing to his cardiomyopathy  Will begin amio, while we see no brady  today, he is also atrial paced which gives [place] leeway--will have to follow SVT burden and PVC burden trhough histograms in his device  Device instructions given   With amio willneed to adjust warfarin down by about 30%  6 mg>>4  With early followup   Signed, Virl Axe MD  06/12/2016

## 2016-06-12 NOTE — Progress Notes (Signed)
Patient to D/C home with daughter. Education done including increase in activity with left arm. Patient to teach back. Amiodarone medication education done. IV removed. Tele monitor removed. CCMD notified. Personal belongings given to patient.   Domingo Dimes RN

## 2016-06-12 NOTE — Discharge Summary (Signed)
Discharge Summary    Patient ID: Ryan [name],  MRN: [id number], DOB/AGE: 1946-02-09 70 y.o.  Admit date: 06/11/2016 Discharge date: 06/12/2016  Primary Care Provider: [facility] Primary Cardiologist: Dr. [name]  Discharge Diagnoses    Active Problems:   SVT (supraventricular tachycardia)  long RP   CHF (congestive heart failure) ([place])   Congestive dilated cardiomyopathy (HCC)   PVC's (premature ventricular contractions)   Allergies Allergies  Allergen Reactions  . Geralyn Flash [Fish Allergy] Nausea And Vomiting    Diagnostic Studies/Procedures  ICD Implant SURGEON:  Will [name], MD      PREPROCEDURE DIAGNOSES:   1. Nonischemic cardiomyopathy.   2. New York Heart Association class II, heart failure chronically.      POSTPROCEDURE DIAGNOSES:   1. Nonischemic cardiomyopathy.   2. New York Heart Association class II heart failure chronically.      PROCEDURES:    1. ICD implantation.  2. Upper extremity venogram    INTRODUCTION:  Ryan [name] is a 70 y.o. male with an ischemic CM (EF 30-35%), NYHA Class II CHF, and CAD. At this time, he meets MADIT II/ SCD-HeFT criteria for ICD implantation for primary prevention of sudden death.  The patient has a narrow QRS and does not meet criteria for revascularization.  The patient has been treated with an optimal medical regimen but continues to have a depressed ejection fraction and NYHA Class II CHF symptoms.  The patient therefore  presents today for ICD implantation.      DESCRIPTION OF PROCEDURE:  Informed written consent was obtained and the patient was brought to the electrophysiology lab in the fasting state. The patient was adequately sedated with intravenous Versed, and fentanyl as outlined in the nursing report.  The patient's left chest was prepped and draped in the usual sterile fashion by the EP lab staff.  The skin overlying the left deltopectoral region was infiltrated with lidocaine for local analgesia.  A 5-cm  incision was made over the left deltopectoral region.  A left subcutaneous defibrillator pocket was fashioned using a combination of sharp and blunt dissection.  Electrocautery was used to assure hemostasis.   Left Upper extremity Venography:  A venogram of the left upper extremity was performed which revealed a moderate sized left axillary vein which emptied into a moderate sized left subclavian vein.    RA/RV Lead Placement: The left axillary vein was cannulated with fluoroscopic visualization.  Through the left axillary vein, a Medtronic model [id number]  (serial # [id number]  ) right atrial lead and a Medtronic, model [id number] (serial number [id number]) right ventricular defibrillator lead were advanced with fluoroscopic visualization into the right atrial appendage and right ventricular apex positions respectively.  Initial atrial lead P-waves measured 3.1 mV with an impedance of 725 ohms and a threshold of 1.4 volts at 0.5 milliseconds.  The right ventricular lead R-wave measured 13.4 mV with impedance of 548 ohms and a threshold of 0.6 volts at 0.5 milliseconds.   The leads were secured to the pectoralis  fascia using #2 silk suture over the suture sleeves.  The pocket then  irrigated with copious gentamicin solution.  The leads were then  connected to a Medtronic Evera MRI XT Dr Rolan Bucco (serial  Number [id number]) ICD.  The defibrillator was placed into the  pocket.  The pocket was then closed in 3 layers with 2.0 Vicryl suture  for the subcutaneous and 2.0 Vicryl suture subcuticular layers. EBL<56m.  Steri-Strips and a  sterile dressing were then  applied.    CONCLUSIONS:   1. Ischemic cardiomyopathy with chronic New York Heart Association class II heart failure.   2. Successful ICD implantation.   3. No early apparent complications.   _____________   History of Present Illness   Ryan [name] is a 70 year old male with ischemic cardiomyopathy (EF 30-35%), NYHA Class II CHF, and CAD. 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 He met MADIT II/ SCD-HeFT criteria for ICD implantation for primary prevention of sudden death. The patient has a narrow QRS and does not meet criteria for revascularization.  The patient has been treated with an optimal medical regimen but continues to have a depressed ejection fraction and NYHA Class II CHF symptoms.  The patient therefore  presented on 06/11/16 for ICD implantation.   Hospital Course  He tolerated ICD implantation well and the procedure was completed without complication.   The morning of 06/12/16 he was noted to be in sustained long RP SVT for about 12 hours overnight. Dr. Caryl Comes saw him and that was terminated with [facility]. He was started on po Amiodarone as this presented another mechanism that could be contributing to his cardiomyopathy.   Amiodarone will be started at '400mg'$  BID for 7 days then '400mg'$  daily. His device can be interrogated to evaluate for SVT burden and PVC's going forward.   He is also on warfarin and his dosing will have to be adjusted now that he is on Amiodarone. Will arrange early follow up. He will go home on '4mg'$  Warfarin and have his INR checked on Tuesday.   He was seen today by Dr. Caryl Comes and deemed suitable for discharge.  _____________  Discharge Vitals Blood pressure (!) 144/76, pulse 79, temperature 98.1 F (36.7 C), temperature source Oral, resp. rate 18, height 6' (1.829 m), weight 179 lb (81.2 kg), SpO2 99 %.  Filed Weights   06/11/16 0923  Weight: 179 lb (81.2 kg)    Labs & Radiologic Studies    Dg Chest 2 View  Result Date: 06/12/2016 CLINICAL DATA:  Pacemaker placement yesterday. Hypertension and diabetes. EXAM: CHEST  2 VIEW COMPARISON:  08/29/2015 FINDINGS: Lateral view degraded by patient arm position. Prior median sternotomy. Pacer/AICD device with leads at right atrium and right ventricle.  Mitral valve repair. Midline trachea. Mild cardiomegaly. Atherosclerosis in the transverse aorta. No pleural effusion or pneumothorax. No congestive failure. Clear lungs. IMPRESSION: Appropriate position of pacer/AICD device, without pneumothorax. Cardiomegaly without congestive failure. Aortic atherosclerosis. Electronically Signed   By: Abigail Miyamoto M.D.   On: 06/12/2016 09:33    Disposition   Pt is being discharged home today in good condition.  Follow-up Plans & Appointments    Follow-up Information    [facility] Follow up on 06/23/2016.   Specialty:  Cardiology Why:  at [facility] for wound check  Contact information: [address] [place] [phone]       Will Meredith Leeds, MD Follow up on 09/17/2016.   Specialty:  Cardiology Why:  at 10:30AM Contact information: [address] [place] [address] [phone]          Discharge Instructions    Call MD for:  redness, tenderness, or signs of infection (pain, swelling, redness, odor or green/yellow discharge around incision site)    Complete by:  As directed    Call MD for:  temperature >100.4    Complete by:  As directed    Diet - low sodium heart healthy    Complete by:  As directed       Discharge Medications   Current Discharge Medication List    START taking these medications   Details  !! amiodarone (PACERONE) 400 MG tablet Take 1 tablet (400 mg total) by mouth 2 (two) times daily. '400mg'$  by mouth two times daily for 7 days, then '400mg'$  daily. Qty: 14 tablet, Refills: 0    !! amiodarone (PACERONE) 400 MG tablet Take 1 tablet (400 mg total) by mouth daily. Qty: 30 tablet, Refills: 12     !! - Potential duplicate medications found. Please discuss with provider.    CONTINUE these medications which have CHANGED   Details  warfarin (COUMADIN) 4 MG tablet Take 1 tablet (4 mg total) by mouth daily. Qty: 30 tablet, Refills: 0        CONTINUE these medications which have NOT CHANGED   Details  amLODipine (NORVASC) 2.5 MG tablet Take 2.5 mg by mouth daily.    atorvastatin (LIPITOR) 40 MG tablet Take 1 tablet (40 mg total) by mouth daily at 6 PM. Qty: 30 tablet, Refills: 5    HYDROcodone-acetaminophen (NORCO/VICODIN) 5-325 MG per tablet Take 1 tablet by mouth 2 (two) times daily as needed for moderate pain.     metoprolol succinate (TOPROL XL) 25 MG 24 hr tablet Take 2 tablets ( total of 50 mg) by mouth once daily Qty: 60 tablet, Refills: 11    pantoprazole (PROTONIX) 40 MG tablet Take 40 mg by mouth daily.     potassium chloride SA (K-DUR,KLOR-CON) 20 MEQ tablet Take 20 mEq by mouth daily.    Associated Diagnoses: Junctional rhythm    [name] 500 MG 12 hr tablet TAKE 1 TABLET (500 MG TOTAL) BY MOUTH 2 (TWO) TIMES DAILY. Qty: 60 tablet, Refills: 3    triamcinolone cream (KENALOG) 0.5 % Apply 1 application topically as needed (SKIN).     valsartan (DIOVAN) 40 MG tablet Take 1 tablet (40 mg total) by mouth daily. Qty: 30 tablet, Refills: 0           Outstanding Labs/Studies   INR in 3 days. TSH, LFT in 6 weeks.   Duration of Discharge Encounter   Greater than 30 minutes including physician time.  Signed, Arbutus Leas NP 06/12/2016, 11:35 AM

## 2016-06-14 ENCOUNTER — Encounter (HOSPITAL_COMMUNITY): Payer: Self-pay | Admitting: Cardiology

## 2016-06-14 DIAGNOSIS — M109 Gout, unspecified: Secondary | ICD-10-CM | POA: Diagnosis not present

## 2016-06-14 DIAGNOSIS — J309 Allergic rhinitis, unspecified: Secondary | ICD-10-CM | POA: Diagnosis not present

## 2016-06-14 DIAGNOSIS — K219 Gastro-esophageal reflux disease without esophagitis: Secondary | ICD-10-CM | POA: Diagnosis not present

## 2016-06-14 DIAGNOSIS — E876 Hypokalemia: Secondary | ICD-10-CM | POA: Diagnosis not present

## 2016-06-14 DIAGNOSIS — I10 Essential (primary) hypertension: Secondary | ICD-10-CM | POA: Diagnosis not present

## 2016-06-14 DIAGNOSIS — E7800 Pure hypercholesterolemia, unspecified: Secondary | ICD-10-CM | POA: Diagnosis not present

## 2016-06-14 DIAGNOSIS — R791 Abnormal coagulation profile: Secondary | ICD-10-CM | POA: Diagnosis not present

## 2016-06-14 DIAGNOSIS — N529 Male erectile dysfunction, unspecified: Secondary | ICD-10-CM | POA: Diagnosis not present

## 2016-06-14 DIAGNOSIS — I498 Other specified cardiac arrhythmias: Secondary | ICD-10-CM | POA: Diagnosis not present

## 2016-06-17 ENCOUNTER — Telehealth: Payer: Self-pay | Admitting: Cardiology

## 2016-06-17 NOTE — Telephone Encounter (Signed)
Order Providers   Prescribing Provider Encounter Provider  Arbutus Leas, NP None  Medication Detail    Disp Refills Start End   amiodarone (PACERONE) 400 MG tablet 30 tablet 12 06/12/2016    Sig - Route: Take 1 tablet (400 mg total) by mouth daily. - Oral   E-Prescribing Status: Receipt confirmed by pharmacy (06/12/2016 11:35 AM EDT)   Pharmacy   [id number] [place], [place] with patient and let him know that he already has refills at the pharmacy. [facility] and call us back if there are any issues or concerns.

## 2016-06-17 NOTE — Telephone Encounter (Signed)
New message       *STAT* If patient is at the pharmacy, call can be transferred to refill team.   1. Which medications need to be refilled? (please list name of each medication and dose if known) amiodarone 400mg   2. Which pharmacy/location (including street and city if local pharmacy) is [facility] sent to[facility] [place] [facility]. Do they need a 30 day or 90 day supply? 90 day if ins will pay

## 2016-06-18 ENCOUNTER — Encounter (HOSPITAL_COMMUNITY): Payer: Self-pay | Admitting: Cardiology

## 2016-06-21 DIAGNOSIS — J309 Allergic rhinitis, unspecified: Secondary | ICD-10-CM | POA: Diagnosis not present

## 2016-06-21 DIAGNOSIS — K219 Gastro-esophageal reflux disease without esophagitis: Secondary | ICD-10-CM | POA: Diagnosis not present

## 2016-06-21 DIAGNOSIS — E7800 Pure hypercholesterolemia, unspecified: Secondary | ICD-10-CM | POA: Diagnosis not present

## 2016-06-21 DIAGNOSIS — Z7901 Long term (current) use of anticoagulants: Secondary | ICD-10-CM | POA: Diagnosis not present

## 2016-06-21 DIAGNOSIS — E876 Hypokalemia: Secondary | ICD-10-CM | POA: Diagnosis not present

## 2016-06-21 DIAGNOSIS — N529 Male erectile dysfunction, unspecified: Secondary | ICD-10-CM | POA: Diagnosis not present

## 2016-06-21 DIAGNOSIS — I10 Essential (primary) hypertension: Secondary | ICD-10-CM | POA: Diagnosis not present

## 2016-06-21 DIAGNOSIS — M109 Gout, unspecified: Secondary | ICD-10-CM | POA: Diagnosis not present

## 2016-06-21 DIAGNOSIS — I498 Other specified cardiac arrhythmias: Secondary | ICD-10-CM | POA: Diagnosis not present

## 2016-06-22 ENCOUNTER — Ambulatory Visit (INDEPENDENT_AMBULATORY_CARE_PROVIDER_SITE_OTHER): Payer: Commercial Managed Care - HMO | Admitting: *Deleted

## 2016-06-22 ENCOUNTER — Encounter: Payer: Self-pay | Admitting: Interventional Cardiology

## 2016-06-22 ENCOUNTER — Ambulatory Visit (INDEPENDENT_AMBULATORY_CARE_PROVIDER_SITE_OTHER): Payer: Commercial Managed Care - HMO | Admitting: Interventional Cardiology

## 2016-06-22 VITALS — BP 132/60 | HR 60 | Ht 72.0 in | Wt 175.8 lb

## 2016-06-22 DIAGNOSIS — I471 Supraventricular tachycardia: Secondary | ICD-10-CM | POA: Diagnosis not present

## 2016-06-22 DIAGNOSIS — I493 Ventricular premature depolarization: Secondary | ICD-10-CM

## 2016-06-22 DIAGNOSIS — Z79899 Other long term (current) drug therapy: Secondary | ICD-10-CM

## 2016-06-22 DIAGNOSIS — I5022 Chronic systolic (congestive) heart failure: Secondary | ICD-10-CM | POA: Diagnosis not present

## 2016-06-22 DIAGNOSIS — Z953 Presence of xenogenic heart valve: Secondary | ICD-10-CM | POA: Diagnosis not present

## 2016-06-22 DIAGNOSIS — I10 Essential (primary) hypertension: Secondary | ICD-10-CM | POA: Diagnosis not present

## 2016-06-22 DIAGNOSIS — I480 Paroxysmal atrial fibrillation: Secondary | ICD-10-CM

## 2016-06-22 LAB — CUP PACEART INCLINIC DEVICE CHECK
Battery Remaining Longevity: 120 mo
Battery Voltage: 3.14 V
Brady Statistic AP VP Percent: 0.16 %
Brady Statistic AP VS Percent: 80.13 %
Brady Statistic AS VP Percent: 0.02 %
Brady Statistic AS VS Percent: 19.69 %
Brady Statistic RA Percent Paced: 80.29 %
Brady Statistic RV Percent Paced: 0.18 %
Date Time Interrogation Session: 20171003153535
HighPow Impedance: 61 Ohm
Implantable Lead Implant Date: 20170922
Implantable Lead Implant Date: 20170922
Implantable Lead Location: 753859
Implantable Lead Location: 753860
Implantable Lead Model: 5076
Lead Channel Impedance Value: 456 Ohm
Lead Channel Impedance Value: 475 Ohm
Lead Channel Impedance Value: 532 Ohm
Lead Channel Pacing Threshold Amplitude: 0.5 V
Lead Channel Pacing Threshold Amplitude: 0.75 V
Lead Channel Pacing Threshold Pulse Width: 0.4 ms
Lead Channel Pacing Threshold Pulse Width: 0.4 ms
Lead Channel Sensing Intrinsic Amplitude: 13.75 mV
Lead Channel Sensing Intrinsic Amplitude: 4.375 mV
Lead Channel Setting Pacing Amplitude: 3.5 V
Lead Channel Setting Pacing Amplitude: 3.5 V
Lead Channel Setting Pacing Pulse Width: 0.4 ms
Lead Channel Setting Sensing Sensitivity: 0.3 mV

## 2016-06-22 MED ORDER — AMIODARONE HCL 200 MG PO TABS: 200.0000 mg | ORAL_TABLET | Freq: Every day | ORAL | 3 refills | Status: DC

## 2016-06-22 NOTE — Progress Notes (Signed)
Wound check appointment. Steri-strips removed. Wound without redness or edema. Incision edges approximated, wound well healed. Normal device function. Thresholds, sensing, and impedances consistent with implant measurements. Device programmed at 3.5V for extra safety margin until 3 month visit. Histogram distribution appropriate for patient and level of activity. No mode switches or ventricular arrhythmias noted. Patient educated about wound care, arm mobility, lifting restrictions, shock plan. ROV with WC 12/29

## 2016-06-22 NOTE — Patient Instructions (Signed)
Medication Instructions:  Your physician has recommended you make the following change in your medication:   After you finish current supply of amiodarone 400 mg daily decrease to amiodarone 200 mg by mouth daily.    Labwork: none  Testing/Procedures: none  Follow-Up: Your physician wants you to follow-up in: 6 months.  You will receive a reminder letter in the mail two months in advance. If you don't receive a letter, please call our office to schedule the follow-up appointment.   Any Other Special Instructions Will Be Listed Below (If Applicable).     If you need a refill on your cardiac medications before your next appointment, please call your pharmacy.

## 2016-06-22 NOTE — Progress Notes (Signed)
Cardiology Office Note    Date:  06/22/2016   ID:  HAPPY KY, DOB 01-19-1946, MRN [id number]  PCP:  [name], MD  Cardiologist: [name], MD   Chief Complaint  Patient presents with  . Atrial Fibrillation    History of Present Illness:  Ryan [name] is a 70 y.o. 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 Recent difficulty with SVT with long RP. Ischemic cardiomyopathy with low EF requiring ICD. Etiology of cardiomyopathy is nonischemic.  PVC-induced nonischemic cardiomyopathy. AICD placed. This was done without the cabinets recently. He is doing well. He still has the bandages on. He is on amiodarone 400 mg daily. He took 400 mg twice a day for the first week. He is complaining of some dizziness. He feels it may be a side effect of the medication. In reviewing the discharge information and appears in been having difficulty with SVT as well.   Past Medical History:  Diagnosis Date  . Atrial fibrillation ([place])   . Cardiomyopathy, dilated ([place])   . Diabetes mellitus without complication ([place])   . Endocarditis   . Hypertension   . Peripheral vascular disease ([place])   . PVC's (premature ventricular contractions)   . SVT (supraventricular tachycardia)  long RP     Past Surgical History:  Procedure Laterality Date  . ABDOMINAL AORTAGRAM N/A 10/03/2012   Procedure: ABDOMINAL Maxcine Ham;  Surgeon: [name], MD;  Location[facility] CATH LAB;  Service: Cardiovascular;  Laterality: N/A;  . CORONARY ANGIOGRAM  09/21/2012   Procedure: CORONARY ANGIOGRAM;  Surgeon: [name], MD;  Location: [facility] CATH LAB;  Service: Cardiovascular;;  . EP IMPLANTABLE DEVICE N/A 06/11/2016   Procedure: ICD Implant;  Surgeon: Will Meredith Leeds, MD;  Location: [facility] CV LAB;  Service: Cardiovascular;   Laterality: N/A;  . EXTREMITY WIRE/PIN REMOVAL  09/14/2012   Procedure: REMOVAL K-WIRE/PIN EXTREMITY;  Surgeon: [name], MD;  Location: [facility];  Service: Orthopedics;  Laterality: Right;  Right Foot  . I&D EXTREMITY  09/14/2012   Procedure: IRRIGATION AND DEBRIDEMENT EXTREMITY;  Surgeon: Tennis Must, MD;  Location: [facility];  Service: Orthopedics;  Laterality: Right;  . INTRAOPERATIVE TRANSESOPHAGEAL ECHOCARDIOGRAM  09/26/2012   Procedure: INTRAOPERATIVE TRANSESOPHAGEAL ECHOCARDIOGRAM;  Surgeon: [name], MD;  Location: [facility] OR;  Service: Open Heart Surgery;  Laterality: N/A;  . MITRAL VALVE REPLACEMENT  09/26/2012   Procedure: MITRAL VALVE (MV) REPLACEMENT;  Surgeon: [name], MD;  Location: [facility] OR;  Service: Open Heart Surgery;  Laterality: N/A;  . RIGHT HEART CATHETERIZATION  09/21/2012   Procedure: RIGHT HEART CATH;  Surgeon: [name], MD;  Location: [facility] CATH LAB;  Service: Cardiovascular;;  . TEE WITHOUT CARDIOVERSION  09/18/2012   Procedure: TRANSESOPHAGEAL ECHOCARDIOGRAM (TEE);  Surgeon: [name], MD;  Location: [facility] ENDOSCOPY;  Service: Cardiovascular;  Laterality: N/A;    Current Medications: Outpatient Medications Prior to Visit  Medication Sig Dispense Refill  . amiodarone (PACERONE) 400 MG tablet Take 1 tablet (400 mg total) by mouth daily. 30 tablet 12  . amLODipine (NORVASC) 2.5 MG tablet Take 2.5 mg by mouth daily.    Marland Kitchen atorvastatin (LIPITOR) 40 MG tablet Take 1 tablet (40 mg total) by mouth daily at 6 PM. 30 tablet 5  . HYDROcodone-acetaminophen (NORCO/VICODIN) 5-325 MG per tablet Take 1 tablet by mouth 2 (two)  times daily as needed for moderate pain.     . pantoprazole (PROTONIX) 40 MG tablet Take 40 mg by mouth daily.     . potassium chloride SA (K-DUR,KLOR-CON) 20 MEQ tablet Take 20 mEq by mouth daily.     Marland Kitchen [name] 500 MG 12 hr tablet TAKE 1 TABLET (500 MG TOTAL) BY MOUTH 2 (TWO) TIMES DAILY. 60 tablet 3  . triamcinolone cream (KENALOG) 0.5 % Apply 1  application topically as needed (SKIN).     . valsartan (DIOVAN) 40 MG tablet Take 1 tablet (40 mg total) by mouth daily. 30 tablet 0  . amiodarone (PACERONE) 400 MG tablet Take 1 tablet (400 mg total) by mouth 2 (two) times daily. 400mg  by mouth two times daily for 7 days, then 400mg  daily. 14 tablet 0  . metoprolol succinate (TOPROL XL) 25 MG 24 hr tablet Take 2 tablets ( total of 50 mg) by mouth once daily (Patient taking differently: Take 25 mg by mouth daily. ) 60 tablet 11  . warfarin (COUMADIN) 4 MG tablet Take 1 tablet (4 mg total) by mouth daily. 30 tablet 0   No facility-administered medications prior to visit.      Allergies:   [name] allergy]   Social History   Social History  . Marital status: Legally Separated    Spouse name: N/A  . Number of children: 3  . Years of education: N/A   Social History Main Topics  . Smoking status: Never Smoker  . Smokeless tobacco: Never Used  . Alcohol use No  . Drug use: No  . Sexual activity: Not Asked   Other Topics Concern  . None   Social History Narrative   Daughter lives with him.  Retired Advertising account planner     Family History:  The patient's family history includes Hypertension in his father and mother.   ROS:   Please see the history of present illness.    Feels relatively well otherwise. Soreness in the left shoulder. Difficulty with dizziness.  All other systems reviewed and are negative.   PHYSICAL EXAM:   VS:  BP 132/60   Pulse 60   Ht 6' (1.829 m)   Wt 175 lb 12.8 oz (79.7 kg)   BMI 23.84 kg/m    GEN: Well nourished, well developed, in no acute distress  HEENT: normal  Neck: no JVD, carotid bruits, or masses Cardiac: RRR; no murmurs, rubs, or gallops,no edema  Respiratory:  clear to auscultation bilaterally, normal work of breathing GI: soft, nontender, nondistended, + BS MS: no deformity or atrophy  Skin: warm and dry, no rash Neuro:  Alert and Oriented x 3, Strength and sensation are intact Psych:  euthymic mood, full affect  Wt Readings from Last 3 Encounters:  06/22/16 175 lb 12.8 oz (79.7 kg)  06/11/16 179 lb (81.2 kg)  05/12/16 176 lb 9.6 oz (80.1 kg)      Studies/Labs Reviewed:   EKG:  EKG  Not done.  Recent Labs: 08/29/2015: B Natriuretic Peptide 333.2 08/30/2015: Magnesium 2.0; TSH 1.779 06/01/2016: BUN 17; Creat 1.17; Hemoglobin 13.1; Platelets 218; Potassium 4.5; Sodium 142   Lipid Panel    Component Value Date/Time   CHOL 121 09/16/2012 0520   TRIG 174 (H) 09/16/2012 0520   HDL 6 (L) 09/16/2012 0520   CHOLHDL 20.2 09/16/2012 0520   VLDL 35 09/16/2012 0520   LDLCALC 80 09/16/2012 0520    Additional studies/ records that were reviewed today include:  Echocardiogram 04/26/16: ------------------------------------------------------------------- LV  EF: 30% -   35%  ------------------------------------------------------------------- Indications:      PVC (I49.3).  ------------------------------------------------------------------- History:   PMH:   Atrial fibrillation.  Stroke.  Risk factors: Pericarditis. Endocarditis. SVT. Hypertension. Diabetes mellitus.   ------------------------------------------------------------------- Study Conclusions  - Left ventricle: The cavity size was normal. Systolic function was   moderately to severely reduced. The estimated ejection fraction   was in the range of 30% to 35%. Diffuse hypokinesis, worse in the   septum. - Aortic valve: Transvalvular velocity was within the normal range.   There was no stenosis. There was no regurgitation. - Mitral valve: A bioprosthesis was present. Pressure half-time: 63   ms. Mean gradient (D): 12 mm Hg. - Left atrium: The atrium was moderately dilated. - Right ventricle: The cavity size was normal. Wall thickness was   normal. Systolic function was moderately reduced. - Tricuspid valve: There was no regurgitation. - Inferior vena cava: The vessel was normal in size. The    respirophasic diameter changes were in the normal range (>= 50%),   consistent with normal central venous pressure.  Impressions:  - Mean gradient across the mitral valve is significantly increased   compared with 12/03/15. 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 Also, the TVI   ratio is 3.4, consistent with pathologic obstruction. 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 Consider repeating echo once tachycardia has   resolved.     ASSESSMENT:    1. Status post mitral valve replacement with bioprosthetic valve   2. Essential hypertension   3. On amiodarone therapy   4. SVT (supraventricular tachycardia)  long RP   5. Paroxysmal atrial fibrillation (HCC)   6. PVC's (premature ventricular contractions)      PLAN:  In order of problems listed above:  1. No evidence of significant valve dysfunction today. 2. Blood pressure is well controlled today. 3. He has completed 100 mg twice a day for one week and is now on 400 mg per day without any instruction to further decrease the dose. We will complete the two-week supply of 400 mg tablets and then decrease amiodarone to 200 mg daily. I assume this is being used to suppress PVCs, atrial fibrillation, and SVT in the setting of nonischemic cardiomyopathy. 4. He will be seen in device clinic today to check his recently placed AICD.     Medication Adjustments/Labs and Tests Ordered: Current medicines are reviewed at length with the patient today.  Concerns regarding medicines are outlined above.  Medication changes, Labs and Tests ordered today are listed in the Patient Instructions below. There are no Patient Instructions on file for this visit.   Signed, [name], MD  06/22/2016 2:19 PM    [facility] [place], [place],    [address] Phone: [phone]; Fax: [phone]

## 2016-06-23 ENCOUNTER — Ambulatory Visit: Payer: Commercial Managed Care - HMO

## 2016-06-24 ENCOUNTER — Telehealth: Payer: Self-pay | Admitting: Interventional Cardiology

## 2016-06-24 NOTE — Telephone Encounter (Signed)
Amiodarone started by Dr. [name].  Will forward to triage and Dr. Macky Lower nurse.

## 2016-06-24 NOTE — Telephone Encounter (Signed)
New Message  Pt c/o medication issue:  1. Name of Medication: amiodarone  2. How are you currently taking this medication (dosage and times per day)? 200 mg tablet once daily  3. Are you having a reaction (difficulty breathing--STAT)? No  4. What is your medication issue? Pt voiced this medication of the 400 mg causes pt have severe bleeding and would like to speak with nurse.

## 2016-06-24 NOTE — Telephone Encounter (Signed)
Per Dr. Thompson Caul rx note, pt to take 400mg  dose QD for 2 more weeks (until 07/06/16), then start 200mg  daily (on 07/07/16).  I advised pt of the instructions and he voiced understanding and agreed with the plan.

## 2016-07-09 ENCOUNTER — Other Ambulatory Visit: Payer: Self-pay | Admitting: *Deleted

## 2016-07-09 NOTE — Telephone Encounter (Signed)
Pharmacy requesting a refill on warfarin 4 mg.

## 2016-07-13 ENCOUNTER — Telehealth: Payer: Self-pay | Admitting: Cardiology

## 2016-07-13 ENCOUNTER — Encounter: Payer: Self-pay | Admitting: *Deleted

## 2016-07-13 NOTE — Telephone Encounter (Signed)
Advised pharmacy that we are aware pt is taking both medications. We will monitor pt while on both medications.  Will review w/ Dr. [name] to see if pt needs follow up EKG to monitor QTC while starting Amiodarone therapy.  (Last EKG on 9/22 at hospital d/c)(Amiodarone started then) EKG not done at [facility] w/ Tamala Julian on 10/3.

## 2016-07-13 NOTE — Telephone Encounter (Signed)
[place] calling with a drug interaction    Pt c/o medication issue:  1. Name of Medication: [name] 500 MG 12 hr tablet& amiodarone (PACERONE) 200 MG tablet  2. How are you currently taking this medication (dosage and times per day)? Amiodarone- daily & ranexa two time daily  3. Are you having a reaction (difficulty breathing--STAT)? Pharmacy calling    4. What is your medication issue? QTC prolong

## 2016-07-14 DIAGNOSIS — E119 Type 2 diabetes mellitus without complications: Secondary | ICD-10-CM | POA: Diagnosis not present

## 2016-07-15 ENCOUNTER — Ambulatory Visit: Payer: Commercial Managed Care - HMO | Admitting: Cardiology

## 2016-07-15 NOTE — Telephone Encounter (Signed)
Arranged nurse visit for EKG on 10/31. Patient verbalized understanding and agreeable to plan.    (need to assess QTC since taking Amiodarone and [name])

## 2016-07-15 NOTE — Telephone Encounter (Signed)
lmtcb to schedule EKG nurse visit

## 2016-07-20 ENCOUNTER — Ambulatory Visit (INDEPENDENT_AMBULATORY_CARE_PROVIDER_SITE_OTHER): Payer: Commercial Managed Care - HMO

## 2016-07-20 VITALS — Ht 73.0 in | Wt 174.0 lb

## 2016-07-20 DIAGNOSIS — I10 Essential (primary) hypertension: Secondary | ICD-10-CM | POA: Diagnosis not present

## 2016-07-20 DIAGNOSIS — I480 Paroxysmal atrial fibrillation: Secondary | ICD-10-CM

## 2016-07-20 NOTE — Progress Notes (Signed)
**Note De-Identified  Obfuscation** The pt arrives in the office for an EKG to monitor QTC due to starting Amiodarone therapy (He is also taking [name]).   The pt has no complaints at this time.  EKG obtained and given to Dr [name], DOD, for his review.  The pt is advised, per Dr [name], that his EKG is ok and for him to continue his current medical therapy. The pt verbalized understanding.

## 2016-07-22 DIAGNOSIS — K219 Gastro-esophageal reflux disease without esophagitis: Secondary | ICD-10-CM | POA: Diagnosis not present

## 2016-07-22 DIAGNOSIS — E7800 Pure hypercholesterolemia, unspecified: Secondary | ICD-10-CM | POA: Diagnosis not present

## 2016-07-22 DIAGNOSIS — Z7901 Long term (current) use of anticoagulants: Secondary | ICD-10-CM | POA: Diagnosis not present

## 2016-07-22 DIAGNOSIS — E876 Hypokalemia: Secondary | ICD-10-CM | POA: Diagnosis not present

## 2016-07-22 DIAGNOSIS — J309 Allergic rhinitis, unspecified: Secondary | ICD-10-CM | POA: Diagnosis not present

## 2016-07-22 DIAGNOSIS — M109 Gout, unspecified: Secondary | ICD-10-CM | POA: Diagnosis not present

## 2016-07-22 DIAGNOSIS — I498 Other specified cardiac arrhythmias: Secondary | ICD-10-CM | POA: Diagnosis not present

## 2016-07-22 DIAGNOSIS — N529 Male erectile dysfunction, unspecified: Secondary | ICD-10-CM | POA: Diagnosis not present

## 2016-07-22 DIAGNOSIS — Z23 Encounter for immunization: Secondary | ICD-10-CM | POA: Diagnosis not present

## 2016-07-29 DIAGNOSIS — Z7901 Long term (current) use of anticoagulants: Secondary | ICD-10-CM | POA: Diagnosis not present

## 2016-08-17 ENCOUNTER — Other Ambulatory Visit: Payer: Self-pay | Admitting: Cardiology

## 2016-08-18 NOTE — Telephone Encounter (Signed)
Follow up   *STAT* If patient is at the pharmacy, call can be transferred to refill team.   1. Which medications need to be refilled? (please list name of each medication and dose if known)  Amiodarone (Pacerone) 200 MG tablet my mouth total daily  2. Which pharmacy/location (including street and city if local pharmacy) is medication to be sent to? [facility] [address], [place]  3. Do they need a 30 day or 90 day supply?  30 day supply  Pt voiced he went to drug store last week and it was still displaying 400 mg and pt needs 200 mg instead.  Please resubmit

## 2016-08-19 ENCOUNTER — Telehealth: Payer: Self-pay | Admitting: Cardiology

## 2016-08-19 NOTE — Telephone Encounter (Signed)
New Message:   Pt says he need to talk to you about his [name].

## 2016-08-19 NOTE — Telephone Encounter (Signed)
Calling to give pt [name] pt assistance # to see if they can offer any help on cost of medication.  Pt is currently outside doing some work at [employer] and cannot write anything down. I will call him in the morning to give him the information.  Also still looking for samples. Pt is agreeable and thanks me for trying to help.

## 2016-08-19 NOTE — Telephone Encounter (Signed)
Pt explains that he is in the doughnut hole and inquiring if he can reduce [name] to once daily.  Advised pt that he cannot reduce medication for effectiveness. Informed that I will see if I can find some samples and get information on medication assistance. He understands I will call him back.

## 2016-08-20 NOTE — Telephone Encounter (Signed)
Left detailed message on cell phone with [name] pt assist # to call and check on assistance.  Informed him I was still working on finding samples while we work on this situation. Explained I would follow up on Monday as our office is closed for the rest of the day.

## 2016-08-26 NOTE — Telephone Encounter (Signed)
Informed pt I was still working on trying to find samples.  He confirms that he did get last rx filled and is currently taking the medication. Considering discussing other medication option with Dr. [name] next week, when he returns to the office. Pt thanks me for helping and agreeable to plan.

## 2016-08-30 DIAGNOSIS — Z7901 Long term (current) use of anticoagulants: Secondary | ICD-10-CM | POA: Diagnosis not present

## 2016-08-30 DIAGNOSIS — N529 Male erectile dysfunction, unspecified: Secondary | ICD-10-CM | POA: Diagnosis not present

## 2016-08-30 DIAGNOSIS — E876 Hypokalemia: Secondary | ICD-10-CM | POA: Diagnosis not present

## 2016-08-30 DIAGNOSIS — M109 Gout, unspecified: Secondary | ICD-10-CM | POA: Diagnosis not present

## 2016-08-30 DIAGNOSIS — J309 Allergic rhinitis, unspecified: Secondary | ICD-10-CM | POA: Diagnosis not present

## 2016-08-30 DIAGNOSIS — I10 Essential (primary) hypertension: Secondary | ICD-10-CM | POA: Diagnosis not present

## 2016-08-30 DIAGNOSIS — K219 Gastro-esophageal reflux disease without esophagitis: Secondary | ICD-10-CM | POA: Diagnosis not present

## 2016-08-30 DIAGNOSIS — I498 Other specified cardiac arrhythmias: Secondary | ICD-10-CM | POA: Diagnosis not present

## 2016-08-30 DIAGNOSIS — E7800 Pure hypercholesterolemia, unspecified: Secondary | ICD-10-CM | POA: Diagnosis not present

## 2016-09-02 ENCOUNTER — Encounter: Payer: Self-pay | Admitting: Cardiology

## 2016-09-02 NOTE — Telephone Encounter (Addendum)
Informed pt I have been unable to locate samples. Discussed [name] pt assistance.  Filled out forms and will have Dr. [name] sign tomorrow. Pt understands I will mail physician completed assistance paperwork to his home address for him to complete, his part, and sign.  He understands to mail completed forms to address sent w/ paperwork. Pt appreciates the help with this. I will follow up with the pt in a month or so if I have not heard back from him.

## 2016-09-06 DIAGNOSIS — Z7901 Long term (current) use of anticoagulants: Secondary | ICD-10-CM | POA: Diagnosis not present

## 2016-09-09 NOTE — Telephone Encounter (Signed)
Informed pt I am mailing [name] pt assist paperwork to his home address.  Explained that we have completed and signed our part of the paperwork, and highlighted the areas that he needs to complete.  He will complete and send other required documents and mail to company when completed.

## 2016-09-17 ENCOUNTER — Encounter: Payer: Self-pay | Admitting: Cardiology

## 2016-09-17 ENCOUNTER — Ambulatory Visit (INDEPENDENT_AMBULATORY_CARE_PROVIDER_SITE_OTHER): Payer: Medicare HMO | Admitting: Cardiology

## 2016-09-17 VITALS — BP 144/70 | HR 60 | Ht 72.0 in | Wt 184.6 lb

## 2016-09-17 DIAGNOSIS — Z95810 Presence of automatic (implantable) cardiac defibrillator: Secondary | ICD-10-CM

## 2016-09-17 DIAGNOSIS — I5022 Chronic systolic (congestive) heart failure: Secondary | ICD-10-CM | POA: Diagnosis not present

## 2016-09-17 LAB — CUP PACEART INCLINIC DEVICE CHECK
Battery Remaining Longevity: 119 mo
Battery Voltage: 3.09 V
Brady Statistic AP VP Percent: 0.09 %
Brady Statistic AP VS Percent: 76.94 %
Brady Statistic AS VP Percent: 0.03 %
Brady Statistic AS VS Percent: 22.94 %
Brady Statistic RA Percent Paced: 76.4 %
Brady Statistic RV Percent Paced: 0.12 %
Date Time Interrogation Session: 20171229131515
HighPow Impedance: 58 Ohm
Implantable Lead Implant Date: 20170922
Implantable Lead Implant Date: 20170922
Implantable Lead Location: 753859
Implantable Lead Location: 753860
Implantable Lead Model: 5076
Implantable Pulse Generator Implant Date: 20170922
Lead Channel Impedance Value: 399 Ohm
Lead Channel Impedance Value: 418 Ohm
Lead Channel Impedance Value: 456 Ohm
Lead Channel Pacing Threshold Amplitude: 0.75 V
Lead Channel Pacing Threshold Amplitude: 1 V
Lead Channel Pacing Threshold Pulse Width: 0.4 ms
Lead Channel Pacing Threshold Pulse Width: 0.4 ms
Lead Channel Sensing Intrinsic Amplitude: 11.4 mV
Lead Channel Sensing Intrinsic Amplitude: 3.9 mV
Lead Channel Setting Pacing Amplitude: 2 V
Lead Channel Setting Pacing Amplitude: 2.5 V
Lead Channel Setting Pacing Pulse Width: 0.4 ms
Lead Channel Setting Sensing Sensitivity: 0.3 mV

## 2016-09-17 NOTE — Progress Notes (Signed)
Electrophysiology Office Note   Date:  09/17/2016   ID:  Ryan, [name] 03-19-1946, MRN [id number]  PCP:  [name], MD  Cardiologist:  Pernell Dupre Primary Electrophysiologist:  Constance Haw, MD    Chief Complaint  Patient presents with  . DEFIB CHECK    3 months post implant     History of Present Illness: Ryan [name] is a 70 y.o. male who presents today for electrophysiology evaluation.   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   He has been having a high volume of PVCs. His most recent monitor showed that his PVC burden was up to 20%. He had an echocardiogram showed an EF of 30-35%, which could be coming from his PVCs. He was put on [name] see if we can decrease his PVCs which may help his low EF. He was in accelerated junctional rhythm when getting his most recent TTE.  Today, he denies symptoms of palpitations, chest pain, orthopnea, PND, lower extremity edema, claudication, dizziness, presyncope, syncope, bleeding, or neurologic sequela. The patient is tolerating medications without difficulties and is otherwise without complaint today.  He is wondering today about sexual activity. He says that he has been feeling well and not having any issues. He has no chest pain or shortness of breath.   Past Medical History:  Diagnosis Date  . Atrial fibrillation ([place])   . 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 Diabetes mellitus without complication ([place])   . Endocarditis   . Hypertension   . Peripheral vascular disease ([place])   . PVC's (premature ventricular contractions)   . SVT (supraventricular tachycardia)  long RP    Past Surgical History:  Procedure Laterality Date  . ABDOMINAL AORTAGRAM N/A 10/03/2012   Procedure: ABDOMINAL Maxcine Ham;  Surgeon: [name], MD;  Location: [facility] CATH LAB;  Service: Cardiovascular;  Laterality: N/A;  . CORONARY  ANGIOGRAM  09/21/2012   Procedure: CORONARY ANGIOGRAM;  Surgeon: [name], MD;  Location: [facility] CATH LAB;  Service: Cardiovascular;;  . EP IMPLANTABLE DEVICE N/A 06/11/2016   Procedure: ICD Implant;  Surgeon: Rosealie Reach Meredith Leeds, MD;  Location: [place] CV LAB;  Service: Cardiovascular;  Laterality: N/A;  . EXTREMITY WIRE/PIN REMOVAL  09/14/2012   Procedure: REMOVAL K-WIRE/PIN EXTREMITY;  Surgeon: [name], MD;  Location: [place];  Service: Orthopedics;  Laterality: Right;  Right Foot  . I&D EXTREMITY  09/14/2012   Procedure: IRRIGATION AND DEBRIDEMENT EXTREMITY;  Surgeon: Tennis Must, MD;  Location: [place];  Service: Orthopedics;  Laterality: Right;  . INTRAOPERATIVE TRANSESOPHAGEAL ECHOCARDIOGRAM  09/26/2012   Procedure: INTRAOPERATIVE TRANSESOPHAGEAL ECHOCARDIOGRAM;  Surgeon: [name], MD;  Location: [facility] OR;  Service: Open Heart Surgery;  Laterality: N/A;  . MITRAL VALVE REPLACEMENT  09/26/2012   Procedure: MITRAL VALVE (MV) REPLACEMENT;  Surgeon: [name], MD;  Location: [place] OR;  Service: Open Heart Surgery;  Laterality: N/A;  . RIGHT HEART CATHETERIZATION  09/21/2012   Procedure: RIGHT HEART CATH;  Surgeon: [name], MD;  Location: [facility] CATH LAB;  Service: Cardiovascular;;  . TEE WITHOUT CARDIOVERSION  09/18/2012   Procedure: TRANSESOPHAGEAL ECHOCARDIOGRAM (TEE);  Surgeon: [name], MD;  Location: [name] ENDOSCOPY;  Service: Cardiovascular;  Laterality: N/A;     Current Outpatient Prescriptions  Medication Sig Dispense Refill  . amiodarone (PACERONE) 200 MG tablet Take 1 tablet (200 mg total) by mouth daily. [place]  tablet 3  . amLODipine (NORVASC) 2.5 MG tablet Take 2.5 mg by mouth daily.    Marland Kitchen atorvastatin (LIPITOR) 40 MG tablet Take 1 tablet (40 mg total) by mouth daily at 6 PM. 30 tablet 5  . HYDROcodone-acetaminophen (NORCO/VICODIN) 5-325 MG per tablet Take 1 tablet by mouth 2 (two) times daily as needed for moderate pain.     . metoprolol succinate (TOPROL-XL) 25 MG  24 hr tablet Take 25 mg by mouth daily.    . pantoprazole (PROTONIX) 40 MG tablet Take 40 mg by mouth daily.     . potassium chloride SA (K-DUR,KLOR-CON) 20 MEQ tablet Take 20 mEq by mouth daily.     Marland Kitchen [name] 500 MG 12 hr tablet TAKE 1 TABLET (500 MG TOTAL) BY MOUTH 2 (TWO) TIMES DAILY. 60 tablet 10  . triamcinolone cream (KENALOG) 0.5 % Apply 1 application topically as needed (SKIN).     . valsartan (DIOVAN) 40 MG tablet Take 1 tablet (40 mg total) by mouth daily. 30 tablet 0  . warfarin (COUMADIN) 6 MG tablet Take 6 mg by mouth daily.  1   No current facility-administered medications for this visit.     Allergies:   [name] allergy]   Social History:  The patient  reports that he has never smoked. He has never used smokeless tobacco. He reports that he does not drink alcohol or use drugs.   Family History:  The patient's family history includes Hypertension in his father and mother.    ROS:  Please see the history of present illness.   All other systems are reviewed and positive for none.    PHYSICAL EXAM: VS:  BP (!) 144/70   Pulse 60   Ht 6' (1.829 m)   Wt 184 lb 9.6 oz (83.7 kg)   BMI 25.04 kg/m  , BMI Body mass index is 25.04 kg/m. GEN: Well nourished, well developed, in no acute distress  HEENT: normal  Neck: no JVD, carotid bruits, or masses Cardiac: RRR; no murmurs, rubs, or gallops,no edema  Respiratory:  clear to auscultation bilaterally, normal work of breathing GI: soft, nontender, nondistended, + BS MS: no deformity or atrophy  Skin: warm and dry Neuro:  Strength and sensation are intact Psych: euthymic mood, full affect  EKG:  EKG is ordered today. Personal review of the ECG ordered shows A paced, rate 60, inferior T wave abnormality, prolonged QT 474  Recent Labs: 06/01/2016: BUN 17; Creat 1.17; Hemoglobin 13.1; Platelets 218; Potassium 4.5; Sodium 142    Lipid Panel     Component Value Date/Time   CHOL 121 09/16/2012 0520   TRIG 174 (H) 09/16/2012  0520   HDL 6 (L) 09/16/2012 0520   CHOLHDL 20.2 09/16/2012 0520   VLDL 35 09/16/2012 0520   LDLCALC 80 09/16/2012 0520     Wt Readings from Last 3 Encounters:  09/17/16 184 lb 9.6 oz (83.7 kg)  07/20/16 174 lb (78.9 kg)  06/22/16 175 lb 12.8 oz (79.7 kg)      Other studies Reviewed: Additional studies/ records that were reviewed today include: 48 hour monitor  Review of the above records today demonstrates:  Minimum: 59 bpm at 9:31 PM Mean: 88 bpm mV Maximum: 134 bpm at 9:44 AM Ventricular ectopy 22.08% of beats Supraventricular events 0.92% of beats Short runs of SVT  TTE 04/26/16 - Left ventricle: The cavity size was normal. Systolic function was   moderately to severely reduced. The estimated ejection fraction   was in  the range of 30% to 35%. Diffuse hypokinesis, worse in the   septum. - Aortic valve: Transvalvular velocity was within the normal range.   There was no stenosis. There was no regurgitation. - Mitral valve: A bioprosthesis was present. Pressure half-time: 63   ms. Mean gradient (D): 12 mm Hg. - Left atrium: The atrium was moderately dilated. - Right ventricle: The cavity size was normal. Wall thickness was   normal. Systolic function was moderately reduced. - Tricuspid valve: There was no regurgitation. - Inferior vena cava: The vessel was normal in size. The   respirophasic diameter changes were in the normal range (>= 50%),   consistent with normal central venous pressure.  Impressions:  - Mean gradient across the mitral valve is significantly increased   compared with 12/03/15. 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 Also, the TVI   ratio is 3.4, consistent with pathologic obstruction. 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   ASSESSMENT AND PLAN:  1. Junctional Rhythm - ICD  implanted 06/11/16. Currently a paced today. Continue current management.  2. PAF - CHADS2-VASc=3. Continue Coumadin.  3. PVC: Having a proximally 90 PVCs per hour. We'll continue current management.  4. S/p MVR - Bioprosthetic. Continue SBE prophylaxis.Had an increased pressure half-time across the prosthetic valve, but it was thought that this was due to functional stenosis due to his tachycardia. We'll repeat echocardiogram in the future to see if this improves with an improved heart rate.   5.  congestive heart failure: ICD implanted 06/11/16. Continue current therapy at this time. He did ask about having sex with his new girlfriend. I told him that this is okay to do. I have told him to discuss medical management with his primary physician  Current asked .medicines are reviewed at length with the patient today.   The patient does not have concerns regarding his medicines.  The following changes were made today:  Decrease Toprol XL  Labs/ tests ordered today include:  No orders of the defined types were placed in this encounter.    Disposition:   FU with Ryan [name] 3 months  Signed, Chauntae Hults Meredith Leeds, MD  09/17/2016 10:42 AM     [facility] [address] [place] [address] [phone] (office) [phone] (fax)

## 2016-09-17 NOTE — Patient Instructions (Addendum)
Medication Instructions:    Your physician recommends that you continue on your current medications as directed. Please refer to the Current Medication list given to you today.  --- If you need a refill on your cardiac medications before your next appointment, please call your pharmacy. ---  Labwork:  None ordered  Testing/Procedures:  None ordered  Follow-Up: Remote monitoring is used to monitor your Pacemaker of ICD from home. This monitoring reduces the number of office visits required to check your device to one time per year. It allows [place] to keep an eye on the functioning of your device to ensure it is working properly. You are scheduled for a device check from home on 12/20/2016. You may send your transmission at any time that day. If you have a wireless device, the transmission will be sent automatically. After your physician reviews your transmission, you will receive a postcard with your next transmission date.   Your physician wants you to follow-up in: 9 monhts with Dr. [name].  You will receive a reminder letter in the mail two months in advance. If you don't receive a letter, please call our office to schedule the follow-up appointment.  Thank you for choosing [facility]!!   Trinidad Curet, RN [phone]

## 2016-09-30 DIAGNOSIS — J309 Allergic rhinitis, unspecified: Secondary | ICD-10-CM | POA: Diagnosis not present

## 2016-09-30 DIAGNOSIS — M109 Gout, unspecified: Secondary | ICD-10-CM | POA: Diagnosis not present

## 2016-09-30 DIAGNOSIS — K219 Gastro-esophageal reflux disease without esophagitis: Secondary | ICD-10-CM | POA: Diagnosis not present

## 2016-09-30 DIAGNOSIS — N183 Chronic kidney disease, stage 3 (moderate): Secondary | ICD-10-CM | POA: Diagnosis not present

## 2016-09-30 DIAGNOSIS — E876 Hypokalemia: Secondary | ICD-10-CM | POA: Diagnosis not present

## 2016-09-30 DIAGNOSIS — I10 Essential (primary) hypertension: Secondary | ICD-10-CM | POA: Diagnosis not present

## 2016-09-30 DIAGNOSIS — I498 Other specified cardiac arrhythmias: Secondary | ICD-10-CM | POA: Diagnosis not present

## 2016-09-30 DIAGNOSIS — E119 Type 2 diabetes mellitus without complications: Secondary | ICD-10-CM | POA: Diagnosis not present

## 2016-09-30 DIAGNOSIS — N529 Male erectile dysfunction, unspecified: Secondary | ICD-10-CM | POA: Diagnosis not present

## 2016-09-30 DIAGNOSIS — E7800 Pure hypercholesterolemia, unspecified: Secondary | ICD-10-CM | POA: Diagnosis not present

## 2016-11-01 DIAGNOSIS — M109 Gout, unspecified: Secondary | ICD-10-CM | POA: Diagnosis not present

## 2016-11-01 DIAGNOSIS — J309 Allergic rhinitis, unspecified: Secondary | ICD-10-CM | POA: Diagnosis not present

## 2016-11-01 DIAGNOSIS — K219 Gastro-esophageal reflux disease without esophagitis: Secondary | ICD-10-CM | POA: Diagnosis not present

## 2016-11-01 DIAGNOSIS — E876 Hypokalemia: Secondary | ICD-10-CM | POA: Diagnosis not present

## 2016-11-01 DIAGNOSIS — E7800 Pure hypercholesterolemia, unspecified: Secondary | ICD-10-CM | POA: Diagnosis not present

## 2016-11-01 DIAGNOSIS — I498 Other specified cardiac arrhythmias: Secondary | ICD-10-CM | POA: Diagnosis not present

## 2016-11-01 DIAGNOSIS — Z7901 Long term (current) use of anticoagulants: Secondary | ICD-10-CM | POA: Diagnosis not present

## 2016-11-01 DIAGNOSIS — Z1389 Encounter for screening for other disorder: Secondary | ICD-10-CM | POA: Diagnosis not present

## 2016-11-01 DIAGNOSIS — H6123 Impacted cerumen, bilateral: Secondary | ICD-10-CM | POA: Diagnosis not present

## 2016-12-01 DIAGNOSIS — E876 Hypokalemia: Secondary | ICD-10-CM | POA: Diagnosis not present

## 2016-12-01 DIAGNOSIS — N183 Chronic kidney disease, stage 3 (moderate): Secondary | ICD-10-CM | POA: Diagnosis not present

## 2016-12-01 DIAGNOSIS — M109 Gout, unspecified: Secondary | ICD-10-CM | POA: Diagnosis not present

## 2016-12-01 DIAGNOSIS — I10 Essential (primary) hypertension: Secondary | ICD-10-CM | POA: Diagnosis not present

## 2016-12-01 DIAGNOSIS — N529 Male erectile dysfunction, unspecified: Secondary | ICD-10-CM | POA: Diagnosis not present

## 2016-12-01 DIAGNOSIS — J309 Allergic rhinitis, unspecified: Secondary | ICD-10-CM | POA: Diagnosis not present

## 2016-12-01 DIAGNOSIS — K219 Gastro-esophageal reflux disease without esophagitis: Secondary | ICD-10-CM | POA: Diagnosis not present

## 2016-12-01 DIAGNOSIS — I498 Other specified cardiac arrhythmias: Secondary | ICD-10-CM | POA: Diagnosis not present

## 2016-12-01 DIAGNOSIS — E7800 Pure hypercholesterolemia, unspecified: Secondary | ICD-10-CM | POA: Diagnosis not present

## 2016-12-20 ENCOUNTER — Ambulatory Visit (INDEPENDENT_AMBULATORY_CARE_PROVIDER_SITE_OTHER): Payer: Medicare HMO | Admitting: *Deleted

## 2016-12-20 ENCOUNTER — Telehealth: Payer: Self-pay | Admitting: Cardiology

## 2016-12-20 DIAGNOSIS — I471 Supraventricular tachycardia: Secondary | ICD-10-CM

## 2016-12-20 DIAGNOSIS — I5022 Chronic systolic (congestive) heart failure: Secondary | ICD-10-CM

## 2016-12-20 NOTE — Telephone Encounter (Addendum)
Spoke with pt and reminded pt of remote transmission that is due today. Pt verbalized understanding.   

## 2016-12-20 NOTE — Progress Notes (Signed)
Remote ICD transmission.   

## 2016-12-21 ENCOUNTER — Encounter: Payer: Self-pay | Admitting: Cardiology

## 2016-12-22 LAB — CUP PACEART REMOTE DEVICE CHECK
Battery Remaining Longevity: 118 mo
Battery Voltage: 3.05 V
Brady Statistic AP VP Percent: 0.05 %
Brady Statistic AP VS Percent: 73.25 %
Brady Statistic AS VP Percent: 0.03 %
Brady Statistic AS VS Percent: 26.67 %
Brady Statistic RA Percent Paced: 73.23 %
Brady Statistic RV Percent Paced: 0.08 %
Date Time Interrogation Session: 20180402181233
HighPow Impedance: 63 Ohm
Implantable Lead Implant Date: 20170922
Implantable Lead Implant Date: 20170922
Implantable Lead Location: 753859
Implantable Lead Location: 753860
Implantable Lead Model: 5076
Implantable Pulse Generator Implant Date: 20170922
Lead Channel Impedance Value: 399 Ohm
Lead Channel Impedance Value: 456 Ohm
Lead Channel Impedance Value: 475 Ohm
Lead Channel Pacing Threshold Amplitude: 0.625 V
Lead Channel Pacing Threshold Amplitude: 1 V
Lead Channel Pacing Threshold Pulse Width: 0.4 ms
Lead Channel Pacing Threshold Pulse Width: 0.4 ms
Lead Channel Sensing Intrinsic Amplitude: 0.625 mV
Lead Channel Sensing Intrinsic Amplitude: 0.625 mV
Lead Channel Sensing Intrinsic Amplitude: 12.5 mV
Lead Channel Sensing Intrinsic Amplitude: 12.5 mV
Lead Channel Setting Pacing Amplitude: 2 V
Lead Channel Setting Pacing Amplitude: 2.5 V
Lead Channel Setting Pacing Pulse Width: 0.4 ms
Lead Channel Setting Sensing Sensitivity: 0.3 mV

## 2017-01-05 DIAGNOSIS — Z7901 Long term (current) use of anticoagulants: Secondary | ICD-10-CM | POA: Diagnosis not present

## 2017-01-05 DIAGNOSIS — E119 Type 2 diabetes mellitus without complications: Secondary | ICD-10-CM | POA: Diagnosis not present

## 2017-01-05 DIAGNOSIS — I10 Essential (primary) hypertension: Secondary | ICD-10-CM | POA: Diagnosis not present

## 2017-01-05 DIAGNOSIS — I498 Other specified cardiac arrhythmias: Secondary | ICD-10-CM | POA: Diagnosis not present

## 2017-01-05 DIAGNOSIS — N529 Male erectile dysfunction, unspecified: Secondary | ICD-10-CM | POA: Diagnosis not present

## 2017-01-05 DIAGNOSIS — M109 Gout, unspecified: Secondary | ICD-10-CM | POA: Diagnosis not present

## 2017-01-05 DIAGNOSIS — E876 Hypokalemia: Secondary | ICD-10-CM | POA: Diagnosis not present

## 2017-01-05 DIAGNOSIS — E7800 Pure hypercholesterolemia, unspecified: Secondary | ICD-10-CM | POA: Diagnosis not present

## 2017-01-05 DIAGNOSIS — J309 Allergic rhinitis, unspecified: Secondary | ICD-10-CM | POA: Diagnosis not present

## 2017-01-05 DIAGNOSIS — K219 Gastro-esophageal reflux disease without esophagitis: Secondary | ICD-10-CM | POA: Diagnosis not present

## 2017-01-12 DIAGNOSIS — Z95810 Presence of automatic (implantable) cardiac defibrillator: Secondary | ICD-10-CM | POA: Insufficient documentation

## 2017-01-12 NOTE — Progress Notes (Signed)
Cardiology Office Note    Date:  01/13/2017   ID:  Ryan [name], Ryan [name] May 24, 1946, MRN [id number]  PCP:  [name], MD  Cardiologist: [name], MD   Chief Complaint  Patient presents with  . Cardiac Valve Problem    History of Present Illness:  Ryan [name] is a 71 y.o. male with endocarditis, subsequent bioprosthetic AoV,PAF/SVT, PVC's at high burden, systolic heart failure secondary to dilated cardiomyopathy, hypertension,  embolic CVA, chronic anticoagulationand PVD.  He is doing well. He has a new girlfriend. He is concerned about sexual performance. He wonders if he can use a PDE 5 inhibitor.  He has no cardiac complaints or side effects. He is on multiple medications. He has had some difficulty with medication expense.  He denies orthopnea, PND, edema, palpitations, and syncope. He does not have coronary disease and is not on long-acting nitrates.  Past Medical History:  Diagnosis Date  . Atrial fibrillation ([place])   . 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 Diabetes mellitus without complication ([place])   . Endocarditis   . Hypertension   . Peripheral vascular disease ([place])   . PVC's (premature ventricular contractions)   . SVT (supraventricular tachycardia)  long RP     Past Surgical History:  Procedure Laterality Date  . ABDOMINAL AORTAGRAM N/A 10/03/2012   Procedure: ABDOMINAL Maxcine Ham;  Surgeon: [name], MD;  Location: [facility] CATH LAB;  Service: Cardiovascular;  Laterality: N/A;  . CORONARY ANGIOGRAM  09/21/2012   Procedure: CORONARY ANGIOGRAM;  Surgeon: [name], MD;  Location: [facility] CATH LAB;  Service: Cardiovascular;;  . EP IMPLANTABLE DEVICE N/A 06/11/2016   Procedure: ICD Implant;  Surgeon: Will Meredith Leeds, MD;  Location: [place] CV LAB;  Service: Cardiovascular;  Laterality: N/A;  . EXTREMITY WIRE/PIN REMOVAL  09/14/2012   Procedure: REMOVAL K-WIRE/PIN EXTREMITY;  Surgeon: [name], MD;  Location: [place];  Service: Orthopedics;   Laterality: Right;  Right Foot  . I&D EXTREMITY  09/14/2012   Procedure: IRRIGATION AND DEBRIDEMENT EXTREMITY;  Surgeon: Tennis Must, MD;  Location: [place];  Service: Orthopedics;  Laterality: Right;  . INTRAOPERATIVE TRANSESOPHAGEAL ECHOCARDIOGRAM  09/26/2012   Procedure: INTRAOPERATIVE TRANSESOPHAGEAL ECHOCARDIOGRAM;  Surgeon: [name], MD;  Location[facility] OR;  Service: Open Heart Surgery;  Laterality: N/A;  . MITRAL VALVE REPLACEMENT  09/26/2012   Procedure: MITRAL VALVE (MV) REPLACEMENT;  Surgeon: [name], MD;  Location: [place] OR;  Service: Open Heart Surgery;  Laterality: N/A;  . RIGHT HEART CATHETERIZATION  09/21/2012   Procedure: RIGHT HEART CATH;  Surgeon: [name], MD;  Location: [facility] CATH LAB;  Service: Cardiovascular;;  . TEE WITHOUT CARDIOVERSION  09/18/2012   Procedure: TRANSESOPHAGEAL ECHOCARDIOGRAM (TEE);  Surgeon: [name], MD;  Location: [facility] ENDOSCOPY;  Service: Cardiovascular;  Laterality: N/A;    Current Medications: Outpatient Medications Prior to Visit  Medication Sig Dispense Refill  . amiodarone (PACERONE) 200 MG tablet Take 1 tablet (200 mg total) by mouth daily. 30 tablet 3  . atorvastatin (LIPITOR) 40 MG tablet Take 1 tablet (40 mg total) by mouth daily at 6 PM. 30 tablet 5  . HYDROcodone-acetaminophen (NORCO/VICODIN) 5-325 MG per tablet Take 1 tablet by mouth 2 (two) times daily as needed for moderate pain.     . metoprolol succinate (TOPROL-XL) 25 MG 24 hr tablet Take 25 mg by mouth daily.    . pantoprazole (PROTONIX) 40 MG tablet Take 40 mg by mouth daily.     Marland Kitchen  potassium chloride SA (K-DUR,KLOR-CON) 20 MEQ tablet Take 20 mEq by mouth daily.     Marland Kitchen [name] 500 MG 12 hr tablet TAKE 1 TABLET (500 MG TOTAL) BY MOUTH 2 (TWO) TIMES DAILY. 60 tablet 10  . triamcinolone cream (KENALOG) 0.5 % Apply 1 application topically as needed (SKIN).     Marland Kitchen amLODipine (NORVASC) 2.5 MG tablet Take 2.5 mg by mouth daily.    . valsartan (DIOVAN) 40 MG tablet Take 1 tablet (40  mg total) by mouth daily. 30 tablet 0  . warfarin (COUMADIN) 6 MG tablet Take 6 mg by mouth daily.  1   No facility-administered medications prior to visit.      Allergies:   [name] allergy]   Social History   Social History  . Marital status: Legally Separated    Spouse name: N/A  . Number of children: 3  . Years of education: N/A   Social History Main Topics  . Smoking status: Never Smoker  . Smokeless tobacco: Never Used  . Alcohol use No  . Drug use: No  . Sexual activity: Not Asked   Other Topics Concern  . None   Social History Narrative   Daughter lives with him.  Retired Advertising account planner     Family History:  The patient's family history includes Hypertension in his father and mother.   ROS:   Please see the history of present illness.    Erectile dysfunction.  All other systems reviewed and are negative.   PHYSICAL EXAM:   VS:  BP (!) 150/74 (BP Location: Left Arm)   Pulse 61   Ht 6' (1.829 m)   Wt 185 lb 12.8 oz (84.3 kg)   BMI 25.20 kg/m    GEN: Well nourished, well developed, in no acute distress  HEENT: normal  Neck: no JVD, carotid bruits, or masses Cardiac: RRR; no murmurs, rubs, or gallops,no edema  Respiratory:  clear to auscultation bilaterally, normal work of breathing GI: soft, nontender, nondistended, + BS MS: no deformity or atrophy  Skin: warm and dry, no rash Neuro:  Alert and Oriented x 3, Strength and sensation are intact Psych: euthymic mood, full affect  Wt Readings from Last 3 Encounters:  01/13/17 185 lb 12.8 oz (84.3 kg)  09/17/16 184 lb 9.6 oz (83.7 kg)  07/20/16 174 lb (78.9 kg)      Studies/Labs Reviewed:   EKG:  EKG  No new data  Recent Labs: 06/01/2016: BUN 17; Creat 1.17; Hemoglobin 13.1; Platelets 218; Potassium 4.5; Sodium 142   Lipid Panel    Component Value Date/Time   CHOL 121 09/16/2012 0520   TRIG 174 (H) 09/16/2012 0520   HDL 6 (L) 09/16/2012 0520   CHOLHDL 20.2 09/16/2012 0520   VLDL 35  09/16/2012 0520   LDLCALC 80 09/16/2012 0520    Additional studies/ records that were reviewed today include:  No new data    ASSESSMENT:    1. Paroxysmal atrial fibrillation (HCC)   2. Essential hypertension   3. Chronic systolic heart failure (HCC)   4. Status post mitral valve replacement with bioprosthetic valve   5. ICD (implantable cardioverter-defibrillator) in place   6. PVC's (premature ventricular contractions)      PLAN:  In order of problems listed above:  1. No recurrences. He is on amiodarone for PVC suppression and it also probably helps with A. fib control. 2. Blood pressure is too high. We will make adjustments and include discontinuation of amlodipine, increase Diovan to  80 mg per day and add 12.5 mg of hydrochlorothiazide. 3. Increase Diovan to further improve LV systolic function. 80 mg has been prescribed as noted above. 4. No clinical evidence of bioprosthetic valve dysfunction. 5. ICD is been implanted because of systolic dysfunction. Possible relationship to high-frequency PVC volume. 6. [name] and amiodarone of being used for PVC suppression. 7. Erectile dysfunction discussed with the patient. I do believe it will be safe for him to use Viagra. We discussed that she use. We will give him 100 mg tablets to be taken either partially or in whole at least one hour prior to attempted intercourse.  Overall doing well. Erectile dysfunction. Blood pressure not well controlled. Medication adjustments made. We will increase ARB intensity to help LV function and blood pressure control, add low-dose diuretic and discontinue amlodipine. Follow-up in blood pressure clinic 10 days from now with basic metabolic panel. Adding APD 5 inhibitor to see if erectile dysfunction will improve.   Medication Adjustments/Labs and Tests Ordered: Current medicines are reviewed at length with the patient today.  Concerns regarding medicines are outlined above.  Medication changes, Labs  and Tests ordered today are listed in the Patient Instructions below. Patient Instructions  Medication Instructions:  1) DISCONTINUE Diovan 2) DISCONTINUE Amlodipine 3) START Diovan/HCT 80/12.5mg  once daily 4) You may start taking Viagra- use as directed by Dr. Phillips Hay: Your physician recommends that you return for lab work at the same time as Hypertension Clinic appointment. (BMET)   Testing/Procedures: None  Follow-Up: Your physician recommends that you schedule a follow-up appointment in: 7-10 days with our Hypertension Clinic.  Your physician wants you to follow-up in: 1 year with Dr .Tamala Julian. You will receive a reminder letter in the mail two months in advance. If you don't receive a letter, please call our office to schedule the follow-up appointment.    Any Other Special Instructions Will Be Listed Below (If Applicable).     If you need a refill on your cardiac medications before your next appointment, please call your pharmacy.      Signed, [name], MD  01/13/2017 10:07 AM    [address], [place], [place]  [address] Phone: [phone]; Fax: [phone]

## 2017-01-13 ENCOUNTER — Encounter: Payer: Self-pay | Admitting: Interventional Cardiology

## 2017-01-13 ENCOUNTER — Ambulatory Visit (INDEPENDENT_AMBULATORY_CARE_PROVIDER_SITE_OTHER): Payer: Medicare HMO | Admitting: Interventional Cardiology

## 2017-01-13 VITALS — BP 150/74 | HR 61 | Ht 72.0 in | Wt 185.8 lb

## 2017-01-13 DIAGNOSIS — I10 Essential (primary) hypertension: Secondary | ICD-10-CM | POA: Diagnosis not present

## 2017-01-13 DIAGNOSIS — I5022 Chronic systolic (congestive) heart failure: Secondary | ICD-10-CM

## 2017-01-13 DIAGNOSIS — I480 Paroxysmal atrial fibrillation: Secondary | ICD-10-CM | POA: Diagnosis not present

## 2017-01-13 DIAGNOSIS — Z95810 Presence of automatic (implantable) cardiac defibrillator: Secondary | ICD-10-CM

## 2017-01-13 DIAGNOSIS — Z953 Presence of xenogenic heart valve: Secondary | ICD-10-CM

## 2017-01-13 DIAGNOSIS — I493 Ventricular premature depolarization: Secondary | ICD-10-CM | POA: Diagnosis not present

## 2017-01-13 MED ORDER — VALSARTAN-HYDROCHLOROTHIAZIDE 80-12.5 MG PO TABS: 1.0000 | ORAL_TABLET | Freq: Every day | ORAL | 3 refills | Status: DC

## 2017-01-13 MED ORDER — SILDENAFIL CITRATE 100 MG PO TABS: 100.0000 mg | ORAL_TABLET | ORAL | 0 refills | Status: DC | PRN

## 2017-01-13 NOTE — Patient Instructions (Signed)
Medication Instructions:  1) DISCONTINUE Diovan 2) DISCONTINUE Amlodipine 3) START Diovan/HCT 80/12.5mg  once daily 4) You may start taking Viagra- use as directed by Dr. Phillips Hay: Your physician recommends that you return for lab work at the same time as Hypertension Clinic appointment. (BMET)   Testing/Procedures: None  Follow-Up: Your physician recommends that you schedule a follow-up appointment in: 7-10 days with our Hypertension Clinic.  Your physician wants you to follow-up in: 1 year with Dr .Tamala Julian. You will receive a reminder letter in the mail two months in advance. If you don't receive a letter, please call our office to schedule the follow-up appointment.    Any Other Special Instructions Will Be Listed Below (If Applicable).     If you need a refill on your cardiac medications before your next appointment, please call your pharmacy.

## 2017-01-20 ENCOUNTER — Ambulatory Visit (INDEPENDENT_AMBULATORY_CARE_PROVIDER_SITE_OTHER): Payer: Medicare HMO | Admitting: Pharmacist

## 2017-01-20 VITALS — BP 152/78 | HR 60

## 2017-01-20 DIAGNOSIS — I10 Essential (primary) hypertension: Secondary | ICD-10-CM | POA: Diagnosis not present

## 2017-01-20 LAB — BASIC METABOLIC PANEL
BUN/Creatinine Ratio: 16 (ref 10–24)
BUN: 22 mg/dL (ref 8–27)
CO2: 23 mmol/L (ref 18–29)
Calcium: 9.4 mg/dL (ref 8.6–10.2)
Chloride: 103 mmol/L (ref 96–106)
Creatinine, Ser: 1.35 mg/dL — ABNORMAL HIGH (ref 0.76–1.27)
GFR calc Af Amer: 60 mL/min/{1.73_m2} (ref >59)
GFR calc non Af Amer: 52 mL/min/{1.73_m2} — ABNORMAL LOW (ref >59)
Glucose: 95 mg/dL (ref 65–99)
Potassium: 4.6 mmol/L (ref 3.5–5.2)
Sodium: 141 mmol/L (ref 134–144)

## 2017-01-20 MED ORDER — VALSARTAN-HYDROCHLOROTHIAZIDE 160-25 MG PO TABS: 1.0000 | ORAL_TABLET | Freq: Every day | ORAL | 11 refills | Status: DC

## 2017-01-20 NOTE — Progress Notes (Signed)
Patient ID: Ryan [name]                 DOB: 08/01/1946                      MRN: [id number]     HPI: Ryan [name] is a 71 y.o. male referred by Dr. Tamala Julian to HTN clinic. 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 Of note, he has a new girlfriend and recently started using PDE 5 inhibitors - not currently on long-acting nitrate.  At last visit with Dr. Tamala Julian on 01/13/2017, valsartan was increased to 80mg  a day to further improve systolic function and 12.5mg  of HCTZ was added, pt was asked to discontinue amlodipine. He presents today for follow up BMET and BP.  Pt reported that he did pick up new prescription for valsartan/HCTZ 80/12.5 but that he thinks he is still taking his amlodipine. In addition, he stated that his bottle of metoprolol still says to take it twice daily, even though a new prescription (and what is current on his medication profile), says Toprol XL 25mg  once daily. He states that even though his prescription bottle says to take twice daily, that he is still taking it once daily. Discussed with the pharmacy, and they did receive two prescriptions both for Toprol XL 25mg  but one was to take 2 tablets and one was to take 1 tablet. Patient is taking 1 tablet once daily at this time. Pharmacy also said they have not filled amlodipine in the last 5 months.  Pt was confused regarding medications and what he is currently taking. Overall, no complaints and has not tried his new sildenafil yet.  Current HTN meds: valsartan/HCTZ 80/12.5, metoprolol succinate 25mg  daily Previously tried: amlodipine 10mg , irbesartan 75mg  daily BP goal: <130/80 mmHg  Family History: Father and mother had HTN  Social History: Previously separated with 3 children, but has new girlfriend. He is a retired [id number]$ and his daughter lives with him. Never smoked or used alcohol.  Home BP readings: Checking at [facility] with their cuff: SBPs 150, then the other two were 128-138/70s  Wt Readings from Last 3 Encounters:  01/13/17 185 lb 12.8 oz (84.3 kg)  09/17/16 184 lb 9.6 oz (83.7 kg)  07/20/16 174 lb (78.9 kg)   BP Readings from Last 3 Encounters:  01/13/17 (!) 150/74  09/17/16 (!) 144/70  06/22/16 132/60   Pulse Readings from Last 3 Encounters:  01/13/17 61  09/17/16 60  06/22/16 60    Renal function: CrCl cannot be calculated (Patient's most recent lab result is older than the maximum 21 days allowed.).  Past Medical History:  Diagnosis Date  . Atrial fibrillation ([place])   . Cardiomyopathy, dilated ([place])   . Diabetes mellitus without complication ([place])   . Endocarditis   . Hypertension   . Peripheral vascular disease ([place])   . PVC's (premature ventricular contractions)   . SVT (supraventricular tachycardia)  long RP     Current Outpatient Prescriptions on File Prior to Visit  Medication Sig Dispense Refill  . amiodarone (PACERONE) 200 MG tablet Take 1 tablet (200 mg total) by mouth daily. 30 tablet 3  . atorvastatin (LIPITOR) 40 MG tablet Take 1 tablet (40 mg total) by mouth daily at 6 PM. 30 tablet 5  . HYDROcodone-acetaminophen (NORCO/VICODIN) 5-325 MG  per tablet Take 1 tablet by mouth 2 (two) times daily as needed for moderate pain.     . metoprolol succinate (TOPROL-XL) 25 MG 24 hr tablet Take 25 mg by mouth daily.    . pantoprazole (PROTONIX) 40 MG tablet Take 40 mg by mouth daily.     . potassium chloride SA (K-DUR,KLOR-CON) 20 MEQ tablet Take 20 mEq by mouth daily.     Marland Kitchen [name] 500 MG 12 hr tablet TAKE 1 TABLET (500 MG TOTAL) BY MOUTH 2 (TWO) TIMES DAILY. 60 tablet 10  . sildenafil (VIAGRA) 100 MG tablet Take 1 tablet (100 mg total) by mouth as needed for erectile dysfunction. 10 tablet 0  . triamcinolone cream (KENALOG) 0.5 % Apply 1 application topically as needed (SKIN).      . valsartan-hydrochlorothiazide (DIOVAN-HCT) 80-12.5 MG tablet Take 1 tablet by mouth daily. 90 tablet 3  . warfarin (COUMADIN) 4 MG tablet Take 4 mg by mouth daily.     No current facility-administered medications on file prior to visit.     Allergies  Allergen Reactions  . Tuna [Fish Allergy] Nausea And Vomiting     Assessment/Plan:  1. Hypertension - Patient currently not at goal BP of < 130/62mmHg. After extensive discussion, patient is not sure what medications he is taking. He seems relatively certain that he is taking his new Diovan-HCTZ 80/12.5mg  and Toprol XL 25mg  once daily. Checking BMET today to ensure stable on new prescription for HCTZ. Pending labs, if patient is stable, will increase to Diovan-HCTZ 160/25mg . He was repeatedly instructed to bring all medications to next visit and counseled to continue current medications other than increase in Diovan-HCTZ dose. Follow up in 2 weeks in HTN clinic.   Melburn Popper, PharmD PGY2 Pharmacy Resident 01/20/17 8:33 AM   Megan E. Supple, PharmD, CPP, [place] [address], [place], [place] [address] Phone: [phone]; Fax: [phone]

## 2017-01-20 NOTE — Patient Instructions (Addendum)
It was great to see you today!  Please start the higher dose of Diovan-HCTZ (valsartan/hydrochlorothiazide) 160mg /25mg  daily. If you still have any of the lower dose of Diovan-HCTZ (valsartan/hydrochlorothiazide) 80mg /12.5mg  then you can take two of these tablets each day until you run out.  Continue all other medications  Follow up in 2 weeks and bring in all of your pills to the next visit.

## 2017-01-27 ENCOUNTER — Telehealth: Payer: Self-pay | Admitting: Interventional Cardiology

## 2017-01-27 NOTE — Telephone Encounter (Signed)
Only dose change at last visit 1 week ago was increasing valsartan-HCTZ dose. BP had been running 150s/70s, unlikely that dose increase would drop systolic BP 00-16 points. Pt had been very confused about medications that he was taking at last visit. Would advise pt to skip his dose of valsartan-HCTZ today and see if he is available for a BP follow up tomorrow, 5/11. He should bring in all of his medications and his home BP cuff.

## 2017-01-27 NOTE — Telephone Encounter (Signed)
Attempted to call pt, left message to call back.  Will also route to [facility] to make them aware of pt's BP since starting new dose of Valsartan/HCTZ.  Pt last seen in HTN clinic on 01/20/17.

## 2017-01-27 NOTE — Telephone Encounter (Signed)
New message      Pt c/o BP issue: STAT if pt c/o blurred vision, one-sided weakness or slurred speech  1. What are your last 5 BP readings? 89/58/ HR 61, 109/63 Hr 60, 102/57 HR 62 2. Are you having any other symptoms (ex. Dizziness, headache, blurred vision, passed out)?  No energy 3. What is your BP issue?  Calling to give bp readings since valsartan-HCTZ dosage changes

## 2017-01-28 DIAGNOSIS — E7800 Pure hypercholesterolemia, unspecified: Secondary | ICD-10-CM | POA: Diagnosis not present

## 2017-01-28 DIAGNOSIS — N529 Male erectile dysfunction, unspecified: Secondary | ICD-10-CM | POA: Diagnosis not present

## 2017-01-28 DIAGNOSIS — M159 Polyosteoarthritis, unspecified: Secondary | ICD-10-CM | POA: Diagnosis not present

## 2017-01-28 DIAGNOSIS — K219 Gastro-esophageal reflux disease without esophagitis: Secondary | ICD-10-CM | POA: Diagnosis not present

## 2017-01-28 DIAGNOSIS — M109 Gout, unspecified: Secondary | ICD-10-CM | POA: Diagnosis not present

## 2017-01-28 DIAGNOSIS — I10 Essential (primary) hypertension: Secondary | ICD-10-CM | POA: Diagnosis not present

## 2017-01-28 DIAGNOSIS — I498 Other specified cardiac arrhythmias: Secondary | ICD-10-CM | POA: Diagnosis not present

## 2017-01-28 DIAGNOSIS — E876 Hypokalemia: Secondary | ICD-10-CM | POA: Diagnosis not present

## 2017-01-28 DIAGNOSIS — J309 Allergic rhinitis, unspecified: Secondary | ICD-10-CM | POA: Diagnosis not present

## 2017-01-29 ENCOUNTER — Other Ambulatory Visit: Payer: Self-pay | Admitting: Interventional Cardiology

## 2017-02-01 NOTE — Telephone Encounter (Signed)
F/u Message ° °Pt returning RN call. Please call back to discuss  °

## 2017-02-01 NOTE — Telephone Encounter (Signed)
LMOM for pt.  

## 2017-02-03 ENCOUNTER — Ambulatory Visit (INDEPENDENT_AMBULATORY_CARE_PROVIDER_SITE_OTHER): Payer: Medicare HMO | Admitting: Pharmacist

## 2017-02-03 VITALS — BP 118/76 | HR 60

## 2017-02-03 DIAGNOSIS — I10 Essential (primary) hypertension: Secondary | ICD-10-CM

## 2017-02-03 MED ORDER — VALSARTAN-HYDROCHLOROTHIAZIDE 80-12.5 MG PO TABS: 1.0000 | ORAL_TABLET | Freq: Every day | ORAL | 11 refills | Status: DC

## 2017-02-03 NOTE — Patient Instructions (Addendum)
It was great to see you today.  Please STOP taking your amlodipine 2.5mg  daily AND valsartan 40mg  daily - I will call your mail order and tell them to stop sending them to you  Continue to take Diovan-HCTZ 80-12.5 mg - 1 tablet once daily  Continue to take Metoprolol 25mg  1 tablet once daily  Call us if you have any questions, [phone]

## 2017-02-03 NOTE — Progress Notes (Signed)
Patient ID: Ryan [name]                 DOB: 06-24-1946                      MRN: [id number]     HPI: Ryan [name] is a 70 y.o. male referred by Dr. Tamala Julian to HTN clinic who presents today for follow up. 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 Of note, he has a new girlfriend and recently started using PDE 5 inhibitors - not currently on long-acting nitrate.  At last visit with Dr. Tamala Julian on 01/13/2017, valsartan was increased to 80mg  a day to further improve systolic function and 12.5mg  of HCTZ was added, pt was asked to discontinue amlodipine. He then followed up in HTN clinic 01/20/2017 with BP still significantly elevated. At that time, pt stated that he did not think he ever discontinued his amlodipine and that he did pick up new prescription for valsartan/HCTZ 80/12.5 from Dr. 18/11/2016.  In addition, he stated that his bottle of metoprolol said to take it twice daily, even though a new prescription (and what was current on his medication profile), said Toprol XL 25mg  once daily. He stated that even though his prescription bottle said to take twice daily, that he was taking it once daily. Discussed with the pharmacy, and they did receive two prescriptions both for Toprol XL 25mg  but one was to take 2 tablets and one was to take 1 tablet. Patient stated he was taking 1 tablet once daily at that time. Pharmacy also said they have not filled amlodipine in the last 5 months.  Pt was confused regarding medications and what he was currently taking. There was extensive conversation and counseling and with BP still elevated, Diovan-HCTZ dose was increased and patient was instructed to bring all medications to next visit.  Since that time, patient called into clinic 01/27/2017 to report low BP readings  - 89/58 HR 61, 109/63 HR 60, 102/57 HR 62  Very concerned that patient is taking medications different from how they are prescribed. BP had been running 150s/70s at clinic appointments, and it is very unlikely that one dose increase would drop systolic BP 18/06/2017 points. Instructed patient to hold Diovan-HCTZ dose and come into clinic with all medications, however he did not return phone call. Pt presents today for BP follow up.  Thankfully patient presented with all medications - of which he carried in a gun case. His blood pressure medicines consisted of amlodipine 2.5mg  daily, metoprolol succinate 25mg  daily, and valsartan/HCTZ 80/12.5. After event mentioned above on 01/27/2017 - patient returned to taking the lower dose of valsartan/HCTZ (80/12.5) instead of 160/25. There was a lengthy discussion of medications where the patient voiced uncertainty about how he takes a lot of his medications, including a valsartan 40mg  dose and additional amlodipine prescription he has in his cabinet. He stated that sometimes he get his medications mixed up, and that sometimes even if they are discontinued that he forgets and can sometimes still take them. Otherwise, in the last week patient has felt great. Denied any lightheadedness, dizziness, fatigue, or near syncope. Recently with new girlfriend, his diet and activity on the golf course and in the house has increased dramatically.  Current HTN meds: valsartan/HCTZ 80/12.5, metoprolol succinate 25mg  daily, amlodipine 2.5mg  daily Previously  tried: amlodipine 10mg  - edema, irbesartan 75mg  daily BP goal: <130/80 mmHg  Family History: Father and mother had HTN  Social History: Previously separated with 3 children, but has new girlfriend. 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 Never smoked or used alcohol.  Home BP readings: [facility] cuff: SBPs 150, then the other two were 128-138/70s  Wt Readings from Last 3 Encounters:   01/13/17 185 lb 12.8 oz (84.3 kg)  09/17/16 184 lb 9.6 oz (83.7 kg)  07/20/16 174 lb (78.9 kg)   BP Readings from Last 3 Encounters:  01/20/17 (!) 152/78  01/13/17 (!) 150/74  09/17/16 (!) 144/70   Pulse Readings from Last 3 Encounters:  01/20/17 60  01/13/17 61  09/17/16 60    Renal function: CrCl cannot be calculated (Unknown ideal weight.).  Past Medical History:  Diagnosis Date  . Atrial fibrillation ([place])   . Cardiomyopathy, dilated ([place])   . Diabetes mellitus without complication ([place])   . Endocarditis   . Hypertension   . Peripheral vascular disease ([place])   . PVC's (premature ventricular contractions)   . SVT (supraventricular tachycardia)  long RP     Current Outpatient Prescriptions on File Prior to Visit  Medication Sig Dispense Refill  . amiodarone (PACERONE) 200 MG tablet TAKE 1 TABLET BY MOUTH EVERY DAY (START IN ABOUT 2 WEEKS AFTER FINISHING 400MG ) 30 tablet 10  . atorvastatin (LIPITOR) 40 MG tablet Take 1 tablet (40 mg total) by mouth daily at 6 PM. 30 tablet 5  . HYDROcodone-acetaminophen (NORCO/VICODIN) 5-325 MG per tablet Take 1 tablet by mouth 2 (two) times daily as needed for moderate pain.     . metoprolol succinate (TOPROL-XL) 25 MG 24 hr tablet Take 25 mg by mouth daily.    . pantoprazole (PROTONIX) 40 MG tablet Take 40 mg by mouth daily.     . potassium chloride SA (K-DUR,KLOR-CON) 20 MEQ tablet Take 20 mEq by mouth daily.     Marland Kitchen [name] 500 MG 12 hr tablet TAKE 1 TABLET (500 MG TOTAL) BY MOUTH 2 (TWO) TIMES DAILY. 60 tablet 10  . sildenafil (VIAGRA) 100 MG tablet Take 1 tablet (100 mg total) by mouth as needed for erectile dysfunction. 10 tablet 0  . triamcinolone cream (KENALOG) 0.5 % Apply 1 application topically as needed (SKIN).     . valsartan-hydrochlorothiazide (DIOVAN HCT) 160-25 MG tablet Take 1 tablet by mouth daily. 30 tablet 11  . warfarin (COUMADIN) 4 MG tablet Take 4 mg by mouth daily.     No current facility-administered medications  on file prior to visit.     Allergies  Allergen Reactions  . Tuna [Fish Allergy] Nausea And Vomiting     Assessment/Plan:  1. Hypertension - At clinic appointment today, BP at goal of < 130/80 mmHg. Pt consistently unsure and confused about medications. He filled a prescription of metoprolol for 2 tablets daily and states that he takes it only once daily because that is what Dr. Tamala Julian told him. However, when I asked him how he took it later in the visit he acted confused reading the pill bottle. In addition, he brought up having a pill bottle of valsartan 40mg  that confuses him when he sees it. He also continues on amlodipine that he is "usually taking". Given BP was unusually low last week, I believe that it is possible that he took some old medications or medications differently than prescribed. Thorough discussion about a pill box  was provided. Called all pharmacies and discontinued any old prescriptions just to make sure that all medication profiles are in line.  Provided extensive counseling to patient to discontinue amlodipine and to not take valsartan. Pt instructed to continue Toprol XL 1 tablet once daily and valsartan/HCTZ 80/12.5 once daily. Provided written instruction, bolded, highlighted and increased font size. By the end of the visit he was able to repeat back entire plan moving forward. Instructed pt to call clinic for any SBP readings <110. Follow up as needed.   Melburn Popper, PharmD, [place] PGY2 Pharmacy Resident  [place]vo Supple, PharmD, CPP, [place] [address], [place], [place] [address] Phone: [phone]; Fax: [phone]

## 2017-02-04 DIAGNOSIS — M109 Gout, unspecified: Secondary | ICD-10-CM | POA: Diagnosis not present

## 2017-02-04 DIAGNOSIS — Z7901 Long term (current) use of anticoagulants: Secondary | ICD-10-CM | POA: Diagnosis not present

## 2017-02-04 DIAGNOSIS — I10 Essential (primary) hypertension: Secondary | ICD-10-CM | POA: Diagnosis not present

## 2017-02-04 DIAGNOSIS — I498 Other specified cardiac arrhythmias: Secondary | ICD-10-CM | POA: Diagnosis not present

## 2017-02-04 DIAGNOSIS — J309 Allergic rhinitis, unspecified: Secondary | ICD-10-CM | POA: Diagnosis not present

## 2017-02-04 DIAGNOSIS — K219 Gastro-esophageal reflux disease without esophagitis: Secondary | ICD-10-CM | POA: Diagnosis not present

## 2017-02-04 DIAGNOSIS — E876 Hypokalemia: Secondary | ICD-10-CM | POA: Diagnosis not present

## 2017-02-04 DIAGNOSIS — N529 Male erectile dysfunction, unspecified: Secondary | ICD-10-CM | POA: Diagnosis not present

## 2017-02-04 DIAGNOSIS — E7800 Pure hypercholesterolemia, unspecified: Secondary | ICD-10-CM | POA: Diagnosis not present

## 2017-03-08 DIAGNOSIS — J309 Allergic rhinitis, unspecified: Secondary | ICD-10-CM | POA: Diagnosis not present

## 2017-03-08 DIAGNOSIS — M109 Gout, unspecified: Secondary | ICD-10-CM | POA: Diagnosis not present

## 2017-03-08 DIAGNOSIS — K219 Gastro-esophageal reflux disease without esophagitis: Secondary | ICD-10-CM | POA: Diagnosis not present

## 2017-03-08 DIAGNOSIS — N529 Male erectile dysfunction, unspecified: Secondary | ICD-10-CM | POA: Diagnosis not present

## 2017-03-08 DIAGNOSIS — E876 Hypokalemia: Secondary | ICD-10-CM | POA: Diagnosis not present

## 2017-03-08 DIAGNOSIS — Z7901 Long term (current) use of anticoagulants: Secondary | ICD-10-CM | POA: Diagnosis not present

## 2017-03-08 DIAGNOSIS — E7800 Pure hypercholesterolemia, unspecified: Secondary | ICD-10-CM | POA: Diagnosis not present

## 2017-03-08 DIAGNOSIS — M25562 Pain in left knee: Secondary | ICD-10-CM | POA: Diagnosis not present

## 2017-03-08 DIAGNOSIS — I498 Other specified cardiac arrhythmias: Secondary | ICD-10-CM | POA: Diagnosis not present

## 2017-03-10 ENCOUNTER — Encounter: Payer: Self-pay | Admitting: Family

## 2017-03-15 DIAGNOSIS — M109 Gout, unspecified: Secondary | ICD-10-CM | POA: Diagnosis not present

## 2017-03-15 DIAGNOSIS — K219 Gastro-esophageal reflux disease without esophagitis: Secondary | ICD-10-CM | POA: Diagnosis not present

## 2017-03-15 DIAGNOSIS — I10 Essential (primary) hypertension: Secondary | ICD-10-CM | POA: Diagnosis not present

## 2017-03-15 DIAGNOSIS — E7800 Pure hypercholesterolemia, unspecified: Secondary | ICD-10-CM | POA: Diagnosis not present

## 2017-03-15 DIAGNOSIS — J309 Allergic rhinitis, unspecified: Secondary | ICD-10-CM | POA: Diagnosis not present

## 2017-03-15 DIAGNOSIS — N529 Male erectile dysfunction, unspecified: Secondary | ICD-10-CM | POA: Diagnosis not present

## 2017-03-15 DIAGNOSIS — I498 Other specified cardiac arrhythmias: Secondary | ICD-10-CM | POA: Diagnosis not present

## 2017-03-15 DIAGNOSIS — Z7901 Long term (current) use of anticoagulants: Secondary | ICD-10-CM | POA: Diagnosis not present

## 2017-03-15 DIAGNOSIS — E876 Hypokalemia: Secondary | ICD-10-CM | POA: Diagnosis not present

## 2017-03-21 ENCOUNTER — Ambulatory Visit (INDEPENDENT_AMBULATORY_CARE_PROVIDER_SITE_OTHER): Payer: Medicare HMO | Admitting: *Deleted

## 2017-03-21 DIAGNOSIS — I5022 Chronic systolic (congestive) heart failure: Secondary | ICD-10-CM | POA: Diagnosis not present

## 2017-03-21 DIAGNOSIS — Z95810 Presence of automatic (implantable) cardiac defibrillator: Secondary | ICD-10-CM

## 2017-03-21 NOTE — Progress Notes (Signed)
Remote ICD transmission.   

## 2017-03-22 ENCOUNTER — Encounter: Payer: Self-pay | Admitting: Cardiology

## 2017-03-22 ENCOUNTER — Other Ambulatory Visit: Payer: Self-pay | Admitting: *Deleted

## 2017-03-22 DIAGNOSIS — N186 End stage renal disease: Secondary | ICD-10-CM

## 2017-03-22 LAB — CUP PACEART REMOTE DEVICE CHECK
Battery Remaining Longevity: 116 mo
Battery Voltage: 3.03 V
Brady Statistic AP VP Percent: 0.05 %
Brady Statistic AP VS Percent: 74.14 %
Brady Statistic AS VP Percent: 0.02 %
Brady Statistic AS VS Percent: 25.79 %
Brady Statistic RA Percent Paced: 74.16 %
Brady Statistic RV Percent Paced: 0.07 %
Date Time Interrogation Session: 20180702041602
HighPow Impedance: 72 Ohm
Implantable Lead Implant Date: 20170922
Implantable Lead Implant Date: 20170922
Implantable Lead Location: 753859
Implantable Lead Location: 753860
Implantable Lead Model: 5076
Implantable Pulse Generator Implant Date: 20170922
Lead Channel Impedance Value: 399 Ohm
Lead Channel Impedance Value: 456 Ohm
Lead Channel Impedance Value: 456 Ohm
Lead Channel Pacing Threshold Amplitude: 0.5 V
Lead Channel Pacing Threshold Amplitude: 0.875 V
Lead Channel Pacing Threshold Pulse Width: 0.4 ms
Lead Channel Pacing Threshold Pulse Width: 0.4 ms
Lead Channel Sensing Intrinsic Amplitude: 12.25 mV
Lead Channel Sensing Intrinsic Amplitude: 12.25 mV
Lead Channel Sensing Intrinsic Amplitude: 3.125 mV
Lead Channel Sensing Intrinsic Amplitude: 3.125 mV
Lead Channel Setting Pacing Amplitude: 2 V
Lead Channel Setting Pacing Amplitude: 2.5 V
Lead Channel Setting Pacing Pulse Width: 0.4 ms
Lead Channel Setting Sensing Sensitivity: 0.3 mV

## 2017-03-24 ENCOUNTER — Ambulatory Visit (INDEPENDENT_AMBULATORY_CARE_PROVIDER_SITE_OTHER): Payer: Medicare HMO | Admitting: Family

## 2017-03-24 ENCOUNTER — Other Ambulatory Visit: Payer: Self-pay

## 2017-03-24 ENCOUNTER — Ambulatory Visit (HOSPITAL_COMMUNITY)
Admission: RE | Admit: 2017-03-24 | Discharge: 2017-03-24 | Disposition: A | Discharge status: Home / Self Care | Payer: Medicare HMO | Source: Ambulatory Visit | Attending: Family | Admitting: Family

## 2017-03-24 ENCOUNTER — Encounter: Payer: Self-pay | Admitting: Family

## 2017-03-24 VITALS — BP 134/76 | HR 60 | Temp 98.3°F | Resp 20 | Ht 72.0 in | Wt 185.2 lb

## 2017-03-24 DIAGNOSIS — I779 Disorder of arteries and arterioles, unspecified: Secondary | ICD-10-CM | POA: Diagnosis not present

## 2017-03-24 DIAGNOSIS — I70201 Unspecified atherosclerosis of native arteries of extremities, right leg: Secondary | ICD-10-CM | POA: Diagnosis not present

## 2017-03-24 DIAGNOSIS — I739 Peripheral vascular disease, unspecified: Secondary | ICD-10-CM

## 2017-03-24 NOTE — Patient Instructions (Signed)

## 2017-03-24 NOTE — Progress Notes (Signed)
[facility] [place]   CC: Follow up peripheral artery occlusive disease  History of Present Illness Ryan [name] is a 71 y.o. male patient of Dr. Trula Slade who is back today for followup. Dr. Trula Slade met the patient in the hospital. He was originally referred for possible right leg ischemia. 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 He also had infected hardware removed from his right foot in December 2013. He developed a wound on his right foot. He underwent angiography which revealed an occluded right popliteal artery. We had been managing his wound nonoperatively. He was visiting the wound center frequently and has since been discharged from their care. He is able to ambulate without difficulty although he does use a cane around the house occasionally.   He exercises at a gym 1-2x/week, golfs once/month.  After about 10 minutes of walking his right lower leg feels weak, relieved by rest.   He denies non healing wounds in feet or legs, the right toe ulcer has healed.  Pt denies any history of stroke or TIA.  He takes coumadin s/p mitral valve replacement.  Pt Diabetic: 6.0 A1C on 03/04/16, in 7 pages of documentation on pt 03-18-16 visit with me, of pt's visit with his PCP, Dr. Truman Hayward, pt does not reca what most recent A1C was.  Pt smoker: non-smoker  Pt meds include: Statin :Yes Betablocker: Yes ASA: No Other anticoagulants/antiplatelets: coumadin, history of atrial fib, s/p mitral valve replacement    Past Medical History:  Diagnosis Date  . Atrial fibrillation ([place])   . Cardiomyopathy, dilated ([place])   . Diabetes mellitus without complication ([place])   . Endocarditis   . Hypertension   . Peripheral vascular disease ([place])   . PVC's (premature ventricular contractions)   . SVT (supraventricular tachycardia)  long RP     Social History Social History  Substance Use Topics  . Smoking status:  Never Smoker  . Smokeless tobacco: Never Used  . Alcohol use No    Family History Family History  Problem Relation Age of Onset  . Hypertension Mother   . Hypertension Father     Past Surgical History:  Procedure Laterality Date  . ABDOMINAL AORTAGRAM N/A 10/03/2012   Procedure: ABDOMINAL Maxcine Ham;  Surgeon: [name], MD;  Location: [facility] CATH LAB;  Service: Cardiovascular;  Laterality: N/A;  . CORONARY ANGIOGRAM  09/21/2012   Procedure: CORONARY ANGIOGRAM;  Surgeon: [name], MD;  Location: [facility] CATH LAB;  Service: Cardiovascular;;  . EP IMPLANTABLE DEVICE N/A 06/11/2016   Procedure: ICD Implant;  Surgeon: Will Meredith Leeds, MD;  Location: [facility] CV LAB;  Service: Cardiovascular;  Laterality: N/A;  . EXTREMITY WIRE/PIN REMOVAL  09/14/2012   Procedure: REMOVAL K-WIRE/PIN EXTREMITY;  Surgeon: [name], MD;  Location: [place];  Service: Orthopedics;  Laterality: Right;  Right Foot  . I&D EXTREMITY  09/14/2012   Procedure: IRRIGATION AND DEBRIDEMENT EXTREMITY;  Surgeon: Tennis Must, MD;  Location: [place];  Service: Orthopedics;  Laterality: Right;  . INTRAOPERATIVE TRANSESOPHAGEAL ECHOCARDIOGRAM  09/26/2012   Procedure: INTRAOPERATIVE TRANSESOPHAGEAL ECHOCARDIOGRAM;  Surgeon: [name], MD;  Location[facility] OR;  Service: Open Heart Surgery;  Laterality: N/A;  . MITRAL VALVE REPLACEMENT  09/26/2012   Procedure: MITRAL VALVE (MV) REPLACEMENT;  Surgeon: [name], MD;  Location: [place] OR;  Service: Open Heart Surgery;  Laterality: N/A;  . RIGHT HEART CATHETERIZATION  09/21/2012   Procedure:  RIGHT HEART CATH;  Surgeon: [name], MD;  Location: [facility] CATH LAB;  Service: Cardiovascular;;  . TEE WITHOUT CARDIOVERSION  09/18/2012   Procedure: TRANSESOPHAGEAL ECHOCARDIOGRAM (TEE);  Surgeon: [name], MD;  Location: [facility] ENDOSCOPY;  Service: Cardiovascular;  Laterality: N/A;    Allergies  Allergen Reactions  . Geralyn Flash [Fish Allergy] Nausea And Vomiting    Current  Outpatient Prescriptions  Medication Sig Dispense Refill  . amiodarone (PACERONE) 200 MG tablet TAKE 1 TABLET BY MOUTH EVERY DAY (START IN ABOUT 2 WEEKS AFTER FINISHING '400MG'$ ) 30 tablet 10  . atorvastatin (LIPITOR) 40 MG tablet Take 1 tablet (40 mg total) by mouth daily at 6 PM. 30 tablet 5  . HYDROcodone-acetaminophen (NORCO/VICODIN) 5-325 MG per tablet Take 1 tablet by mouth 2 (two) times daily as needed for moderate pain.     . metoprolol succinate (TOPROL-XL) 25 MG 24 hr tablet Take 25 mg by mouth daily.    . pantoprazole (PROTONIX) 40 MG tablet Take 40 mg by mouth daily.     . potassium chloride SA (K-DUR,KLOR-CON) 20 MEQ tablet Take 20 mEq by mouth daily.     Marland Kitchen [name] 500 MG 12 hr tablet TAKE 1 TABLET (500 MG TOTAL) BY MOUTH 2 (TWO) TIMES DAILY. 60 tablet 10  . sildenafil (VIAGRA) 100 MG tablet Take 1 tablet (100 mg total) by mouth as needed for erectile dysfunction. 10 tablet 0  . triamcinolone cream (KENALOG) 0.5 % Apply 1 application topically as needed (SKIN).     . valsartan-hydrochlorothiazide (DIOVAN HCT) 80-12.5 MG tablet Take 1 tablet by mouth daily. 30 tablet 11  . warfarin (COUMADIN) 4 MG tablet Take 4 mg by mouth daily.     No current facility-administered medications for this visit.     ROS: See HPI for pertinent positives and negatives.   Physical Examination  Vitals:   03/24/17 1156  BP: 134/76  Pulse: 60  Resp: 20  Temp: 98.3 F (36.8 C)  TempSrc: Oral  SpO2: 99%  Weight: 185 lb 3.2 oz (84 kg)  Height: 6' (1.829 m)   Body mass index is 25.12 kg/m.  General: A&O x 3, WDWN. Gait: normal Eyes: Pupils equal Pulmonary: CTAB, without wheezes , rales or rhonchi. Cardiac: regular rhythm and rate, no detected murmur, AICD palpated left upper chest.     Carotid Bruits Left Right   Negative Negative  Aorta is not palpable. Radial pulses: are 2+ palpable and =   VASCULAR EXAM: Extremities without ischemic changes   without Gangrene. Right second toe ecchymosis is resolving, no swelling, but he does have a slow to heal 3 mm diameter shallow ulcer that is being irritated by a small bony prominence at his great toe.      LE Pulses LEFT RIGHT   FEMORAL 2+ palpable 2+ palpable    POPLITEAL not palpable  not palpable   POSTERIOR TIBIAL not palpable  not palpable    DORSALIS PEDIS  ANTERIOR TIBIAL not palpable  not palpable    Abdomen: soft, NT, no palpable masses. Skin: no rashes. Musculoskeletal: no muscle wasting or atrophy. Neurologic: A&O X 3; Appropriate Affect ; SENSATION: normal; MOTOR FUNCTION: moving all extremities equally, motor strength 5/5 throughout. Speech is fluent/normal. CN 2-12 intact.     ASSESSMENT: BELAL SCALLON is a 71 y.o. 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  Right toe ulcer has completely healed, no signs of ischemia in his feet or legs.  He  takes a statin and coumadin. Fortunately he does not have DM and has never uses tobacco.   DATA  ABI (Date: 03/24/2017)  R:   ABI: 0.72 (0.78 on 03-18-16),   PT: mono  DP: mono  TBI:  0.53, toe pressure: 85  L:   ABI: [place] (was 1.29 with biphasic waveforms),   PT: tri  DP: mono  TBI: 0.60, toe pressure: 96 ABI: moderate arterial occlusive disease in the right LE with monophasic waveforms, non compressible in the left with triphasic PT and monophasic DP waveforms.  Decline in bilateral ABI and/or waveforms.   PLAN:  Graduated walking program discussed and how to achieve.  Based on the patient's vascular studies and examination, pt will return to clinic in 6 months with ABI's. I advised him to notify us if he develops concerns re the circulation  in his feet or legs.   Continue graduated walking program.  I discussed in depth with the patient the nature of atherosclerosis, and emphasized the importance of maximal medical management including strict control of blood pressure, blood glucose, and lipid levels, obtaining regular exercise, and continued cessation of smoking.  The patient is aware that without maximal medical management the underlying atherosclerotic disease process will progress, limiting the benefit of any interventions.  The patient was given information about PAD including signs, symptoms, treatment, what symptoms should prompt the patient to seek immediate medical care, and risk reduction measures to take.  Ryan Chambers, RN, MSN, FNP-C [facility] Phone: [phone]  Clinic MD: [facility]  03/24/17 12:17 PM

## 2017-03-25 DIAGNOSIS — I498 Other specified cardiac arrhythmias: Secondary | ICD-10-CM | POA: Diagnosis not present

## 2017-03-25 DIAGNOSIS — N183 Chronic kidney disease, stage 3 (moderate): Secondary | ICD-10-CM | POA: Diagnosis not present

## 2017-03-25 DIAGNOSIS — J309 Allergic rhinitis, unspecified: Secondary | ICD-10-CM | POA: Diagnosis not present

## 2017-03-25 DIAGNOSIS — I739 Peripheral vascular disease, unspecified: Secondary | ICD-10-CM | POA: Diagnosis not present

## 2017-03-25 DIAGNOSIS — I10 Essential (primary) hypertension: Secondary | ICD-10-CM | POA: Diagnosis not present

## 2017-03-25 DIAGNOSIS — E7800 Pure hypercholesterolemia, unspecified: Secondary | ICD-10-CM | POA: Diagnosis not present

## 2017-03-25 DIAGNOSIS — E876 Hypokalemia: Secondary | ICD-10-CM | POA: Diagnosis not present

## 2017-03-25 DIAGNOSIS — E119 Type 2 diabetes mellitus without complications: Secondary | ICD-10-CM | POA: Diagnosis not present

## 2017-03-25 DIAGNOSIS — K219 Gastro-esophageal reflux disease without esophagitis: Secondary | ICD-10-CM | POA: Diagnosis not present

## 2017-04-04 NOTE — Addendum Note (Signed)
Addended by: Lianne Cure A on: 04/04/2017 04:44 PM   Modules accepted: Orders

## 2017-04-20 DIAGNOSIS — I10 Essential (primary) hypertension: Secondary | ICD-10-CM | POA: Diagnosis not present

## 2017-04-20 DIAGNOSIS — E7800 Pure hypercholesterolemia, unspecified: Secondary | ICD-10-CM | POA: Diagnosis not present

## 2017-04-20 DIAGNOSIS — N183 Chronic kidney disease, stage 3 (moderate): Secondary | ICD-10-CM | POA: Diagnosis not present

## 2017-04-20 DIAGNOSIS — K219 Gastro-esophageal reflux disease without esophagitis: Secondary | ICD-10-CM | POA: Diagnosis not present

## 2017-04-20 DIAGNOSIS — R791 Abnormal coagulation profile: Secondary | ICD-10-CM | POA: Diagnosis not present

## 2017-04-20 DIAGNOSIS — E876 Hypokalemia: Secondary | ICD-10-CM | POA: Diagnosis not present

## 2017-04-20 DIAGNOSIS — I739 Peripheral vascular disease, unspecified: Secondary | ICD-10-CM | POA: Diagnosis not present

## 2017-04-20 DIAGNOSIS — J309 Allergic rhinitis, unspecified: Secondary | ICD-10-CM | POA: Diagnosis not present

## 2017-04-20 DIAGNOSIS — I498 Other specified cardiac arrhythmias: Secondary | ICD-10-CM | POA: Diagnosis not present

## 2017-04-27 DIAGNOSIS — K219 Gastro-esophageal reflux disease without esophagitis: Secondary | ICD-10-CM | POA: Diagnosis not present

## 2017-04-27 DIAGNOSIS — R791 Abnormal coagulation profile: Secondary | ICD-10-CM | POA: Diagnosis not present

## 2017-04-27 DIAGNOSIS — R42 Dizziness and giddiness: Secondary | ICD-10-CM | POA: Diagnosis not present

## 2017-04-27 DIAGNOSIS — J309 Allergic rhinitis, unspecified: Secondary | ICD-10-CM | POA: Diagnosis not present

## 2017-04-27 DIAGNOSIS — I498 Other specified cardiac arrhythmias: Secondary | ICD-10-CM | POA: Diagnosis not present

## 2017-04-27 DIAGNOSIS — I10 Essential (primary) hypertension: Secondary | ICD-10-CM | POA: Diagnosis not present

## 2017-04-27 DIAGNOSIS — N183 Chronic kidney disease, stage 3 (moderate): Secondary | ICD-10-CM | POA: Diagnosis not present

## 2017-04-27 DIAGNOSIS — E1142 Type 2 diabetes mellitus with diabetic polyneuropathy: Secondary | ICD-10-CM | POA: Diagnosis not present

## 2017-04-27 DIAGNOSIS — E7800 Pure hypercholesterolemia, unspecified: Secondary | ICD-10-CM | POA: Diagnosis not present

## 2017-05-05 DIAGNOSIS — K219 Gastro-esophageal reflux disease without esophagitis: Secondary | ICD-10-CM | POA: Diagnosis not present

## 2017-05-05 DIAGNOSIS — E876 Hypokalemia: Secondary | ICD-10-CM | POA: Diagnosis not present

## 2017-05-05 DIAGNOSIS — E7800 Pure hypercholesterolemia, unspecified: Secondary | ICD-10-CM | POA: Diagnosis not present

## 2017-05-05 DIAGNOSIS — J309 Allergic rhinitis, unspecified: Secondary | ICD-10-CM | POA: Diagnosis not present

## 2017-05-05 DIAGNOSIS — I498 Other specified cardiac arrhythmias: Secondary | ICD-10-CM | POA: Diagnosis not present

## 2017-05-05 DIAGNOSIS — R791 Abnormal coagulation profile: Secondary | ICD-10-CM | POA: Diagnosis not present

## 2017-05-05 DIAGNOSIS — I739 Peripheral vascular disease, unspecified: Secondary | ICD-10-CM | POA: Diagnosis not present

## 2017-05-05 DIAGNOSIS — E119 Type 2 diabetes mellitus without complications: Secondary | ICD-10-CM | POA: Diagnosis not present

## 2017-05-05 DIAGNOSIS — N183 Chronic kidney disease, stage 3 (moderate): Secondary | ICD-10-CM | POA: Diagnosis not present

## 2017-05-12 DIAGNOSIS — K219 Gastro-esophageal reflux disease without esophagitis: Secondary | ICD-10-CM | POA: Diagnosis not present

## 2017-05-12 DIAGNOSIS — E119 Type 2 diabetes mellitus without complications: Secondary | ICD-10-CM | POA: Diagnosis not present

## 2017-05-12 DIAGNOSIS — I498 Other specified cardiac arrhythmias: Secondary | ICD-10-CM | POA: Diagnosis not present

## 2017-05-12 DIAGNOSIS — E876 Hypokalemia: Secondary | ICD-10-CM | POA: Diagnosis not present

## 2017-05-12 DIAGNOSIS — E7800 Pure hypercholesterolemia, unspecified: Secondary | ICD-10-CM | POA: Diagnosis not present

## 2017-05-12 DIAGNOSIS — N183 Chronic kidney disease, stage 3 (moderate): Secondary | ICD-10-CM | POA: Diagnosis not present

## 2017-05-12 DIAGNOSIS — J309 Allergic rhinitis, unspecified: Secondary | ICD-10-CM | POA: Diagnosis not present

## 2017-05-12 DIAGNOSIS — I739 Peripheral vascular disease, unspecified: Secondary | ICD-10-CM | POA: Diagnosis not present

## 2017-05-12 DIAGNOSIS — R791 Abnormal coagulation profile: Secondary | ICD-10-CM | POA: Diagnosis not present

## 2017-05-19 DIAGNOSIS — Z7901 Long term (current) use of anticoagulants: Secondary | ICD-10-CM | POA: Diagnosis not present

## 2017-06-13 DIAGNOSIS — E7800 Pure hypercholesterolemia, unspecified: Secondary | ICD-10-CM | POA: Diagnosis not present

## 2017-06-13 DIAGNOSIS — J309 Allergic rhinitis, unspecified: Secondary | ICD-10-CM | POA: Diagnosis not present

## 2017-06-13 DIAGNOSIS — N183 Chronic kidney disease, stage 3 (moderate): Secondary | ICD-10-CM | POA: Diagnosis not present

## 2017-06-13 DIAGNOSIS — K219 Gastro-esophageal reflux disease without esophagitis: Secondary | ICD-10-CM | POA: Diagnosis not present

## 2017-06-13 DIAGNOSIS — I498 Other specified cardiac arrhythmias: Secondary | ICD-10-CM | POA: Diagnosis not present

## 2017-06-13 DIAGNOSIS — Z23 Encounter for immunization: Secondary | ICD-10-CM | POA: Diagnosis not present

## 2017-06-13 DIAGNOSIS — I10 Essential (primary) hypertension: Secondary | ICD-10-CM | POA: Diagnosis not present

## 2017-06-13 DIAGNOSIS — Z1389 Encounter for screening for other disorder: Secondary | ICD-10-CM | POA: Diagnosis not present

## 2017-06-13 DIAGNOSIS — Z7901 Long term (current) use of anticoagulants: Secondary | ICD-10-CM | POA: Diagnosis not present

## 2017-06-20 ENCOUNTER — Ambulatory Visit (INDEPENDENT_AMBULATORY_CARE_PROVIDER_SITE_OTHER): Payer: Medicare HMO | Admitting: *Deleted

## 2017-06-20 DIAGNOSIS — I5022 Chronic systolic (congestive) heart failure: Secondary | ICD-10-CM

## 2017-06-20 DIAGNOSIS — I480 Paroxysmal atrial fibrillation: Secondary | ICD-10-CM

## 2017-06-21 LAB — CUP PACEART REMOTE DEVICE CHECK
Battery Remaining Longevity: 113 mo
Battery Voltage: 3.02 V
Brady Statistic AP VP Percent: 0.05 %
Brady Statistic AP VS Percent: 62.38 %
Brady Statistic AS VP Percent: 0.03 %
Brady Statistic AS VS Percent: 37.53 %
Brady Statistic RA Percent Paced: 62.4 %
Brady Statistic RV Percent Paced: 0.08 %
Date Time Interrogation Session: 20181001073423
HighPow Impedance: 62 Ohm
Implantable Lead Implant Date: 20170922
Implantable Lead Implant Date: 20170922
Implantable Lead Location: 753859
Implantable Lead Location: 753860
Implantable Lead Model: 5076
Implantable Pulse Generator Implant Date: 20170922
Lead Channel Impedance Value: 399 Ohm
Lead Channel Impedance Value: 418 Ohm
Lead Channel Impedance Value: 456 Ohm
Lead Channel Pacing Threshold Amplitude: 0.625 V
Lead Channel Pacing Threshold Amplitude: 0.75 V
Lead Channel Pacing Threshold Pulse Width: 0.4 ms
Lead Channel Pacing Threshold Pulse Width: 0.4 ms
Lead Channel Sensing Intrinsic Amplitude: 11.875 mV
Lead Channel Sensing Intrinsic Amplitude: 11.875 mV
Lead Channel Sensing Intrinsic Amplitude: 2.5 mV
Lead Channel Sensing Intrinsic Amplitude: 2.5 mV
Lead Channel Setting Pacing Amplitude: 2 V
Lead Channel Setting Pacing Amplitude: 2.5 V
Lead Channel Setting Pacing Pulse Width: 0.4 ms
Lead Channel Setting Sensing Sensitivity: 0.3 mV

## 2017-06-21 NOTE — Progress Notes (Signed)
Remote ICD transmission.   

## 2017-06-22 ENCOUNTER — Encounter: Payer: Self-pay | Admitting: Cardiology

## 2017-06-22 ENCOUNTER — Telehealth: Payer: Self-pay | Admitting: *Deleted

## 2017-06-22 NOTE — Telephone Encounter (Signed)
No ring. LMOM to call back to [facility] to schedule appt with Dr. [name].  Needs device reprogramming at this appointment.

## 2017-06-29 ENCOUNTER — Other Ambulatory Visit: Payer: Self-pay | Admitting: Cardiology

## 2017-07-13 DIAGNOSIS — E119 Type 2 diabetes mellitus without complications: Secondary | ICD-10-CM | POA: Diagnosis not present

## 2017-07-27 ENCOUNTER — Ambulatory Visit: Payer: Medicare HMO | Admitting: Cardiology

## 2017-07-27 ENCOUNTER — Encounter: Payer: Self-pay | Admitting: Cardiology

## 2017-07-27 VITALS — BP 146/76 | HR 60 | Ht 72.0 in | Wt 189.6 lb

## 2017-07-27 DIAGNOSIS — I428 Other cardiomyopathies: Secondary | ICD-10-CM | POA: Diagnosis not present

## 2017-07-27 DIAGNOSIS — I480 Paroxysmal atrial fibrillation: Secondary | ICD-10-CM

## 2017-07-27 DIAGNOSIS — I493 Ventricular premature depolarization: Secondary | ICD-10-CM | POA: Diagnosis not present

## 2017-07-27 LAB — HEPATIC FUNCTION PANEL
ALT: 35 IU/L (ref 0–44)
AST: 36 IU/L (ref 0–40)
Albumin: 4 g/dL (ref 3.5–4.8)
Alkaline Phosphatase: 63 IU/L (ref 39–117)
Bilirubin Total: 0.6 mg/dL (ref 0.0–1.2)
Bilirubin, Direct: 0.15 mg/dL (ref 0.00–0.40)
Total Protein: 6.6 g/dL (ref 6.0–8.5)

## 2017-07-27 LAB — TSH: TSH: 2.74 u[IU]/mL (ref 0.450–4.500)

## 2017-07-27 NOTE — Patient Instructions (Addendum)
Medication Instructions:   Your physician has recommended you make the following change in your medication:  1.)  Stop Renexa  -- If you need a refill on your cardiac medications before your next appointment, please call your pharmacy. --  Labwork: Your physician recommends that you return for lab work today:  TSH/LFT  Testing/Procedures: None ordered  Follow-Up: Your physician wants you to follow-up in: 6 months with Dr. [name].     Remote monitoring is used to monitor your ICD from home. This monitoring reduces the number of office visits required to check your device to one time per year. It allows [place] to keep an eye on the functioning of your device to ensure it is working properly. You are scheduled for a device check from home on 09/19/2017. You may send your transmission at any time that day. If you have a wireless device, the transmission will be sent automatically. After your physician reviews your transmission, you will receive a postcard with your next transmission date.    Thank you for choosing [facility]!!   [phone]  Any Other Special Instructions Will Be Listed Below (If Applicable).

## 2017-07-27 NOTE — Progress Notes (Signed)
Electrophysiology Office Note   Date:  07/27/2017   ID:  Jahlil, Ziller [birth year], MRN [id number]  PCP:  [name], MD  Cardiologist:  Pernell Dupre Primary Electrophysiologist:  Mahnoor Mathisen Meredith Leeds, MD    Chief Complaint  Patient presents with  . Defib Check    Ischemic cardiomyopathy/Chronic systolic CHF/PAF     History of Present Illness: Ryan [name] is a 71 y.o. male who presents today for electrophysiology evaluation.   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   He has been having a high volume of PVCs. His most recent monitor showed that his PVC burden was up to 20%. He had an echocardiogram showed an EF of 30-35%, which could be coming from his PVCs. He was put on [name] see if we can decrease his PVCs which may help his low EF. He was in accelerated junctional rhythm when getting his most recent TTE.  Today, denies symptoms of palpitations, chest pain, orthopnea, PND, lower extremity edema, claudication, dizziness, presyncope, syncope, bleeding, or neurologic sequela. The patient is tolerating medications without difficulties.  He is overall felt well since last being seen.  He has minimal amounts of shortness of breath mainly with exertion.  He does not have any chest pain.  He has some decreased exertional stamina.  Of note he is pacing in the atrium up to 75% of the time with flat histograms.   Past Medical History:  Diagnosis Date  . Atrial fibrillation ([place])   . 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 Diabetes mellitus without complication ([place])   . Endocarditis   . Hypertension   . Peripheral vascular disease ([place])   . PVC's (premature ventricular contractions)   . SVT (supraventricular tachycardia)  long RP    No past surgical history on file.   Current Outpatient Medications  Medication Sig Dispense Refill  . amiodarone (PACERONE) 200 MG  tablet TAKE 1 TABLET BY MOUTH EVERY DAY (START IN ABOUT 2 WEEKS AFTER FINISHING 400MG ) 30 tablet 10  . atorvastatin (LIPITOR) 40 MG tablet Take 1 tablet (40 mg total) by mouth daily at 6 PM. 30 tablet 5  . HYDROcodone-acetaminophen (NORCO/VICODIN) 5-325 MG per tablet Take 1 tablet by mouth 2 (two) times daily as needed for moderate pain.     . metoprolol succinate (TOPROL-XL) 25 MG 24 hr tablet Take 25 mg by mouth daily.    . pantoprazole (PROTONIX) 40 MG tablet Take 40 mg by mouth daily.     . potassium chloride SA (K-DUR,KLOR-CON) 20 MEQ tablet Take 20 mEq by mouth daily.     . ranolazine ([name]) 500 MG 12 hr tablet Take 1 tablet (500 mg total) by mouth 2 (two) times daily. Please call and schedule an appointment with Dr [name] 1st attempt 60 tablet 0  . sildenafil (VIAGRA) 100 MG tablet Take 1 tablet (100 mg total) by mouth as needed for erectile dysfunction. 10 tablet 0  . triamcinolone cream (KENALOG) 0.5 % Apply 1 application topically as needed (SKIN).     . valsartan-hydrochlorothiazide (DIOVAN HCT) 80-12.5 MG tablet Take 1 tablet by mouth daily. 30 tablet 11  . warfarin (COUMADIN) 4 MG tablet Take 4 mg by mouth daily.     No current facility-administered medications for this visit.     Allergies:   [name] allergy]   Social History:  The patient  reports that  has never smoked. he  has never used smokeless tobacco. He reports that he does not drink alcohol or use drugs.   Family History:  The patient's family history includes Hypertension in his father and mother.    ROS:  Please see the history of present illness.   Otherwise, review of systems is positive for SOB, back pain, muscle pain.   All other systems are reviewed and negative.   PHYSICAL EXAM: VS:  BP (!) 146/76   Pulse 60   Ht 6' (1.829 m)   Wt 189 lb 9.6 oz (86 kg)   SpO2 98%   BMI 25.71 kg/m  , BMI Body mass index is 25.71 kg/m. GEN: Well nourished, well developed, in no acute distress  HEENT: normal  Neck:  no JVD, carotid bruits, or masses Cardiac: RRR; no murmurs, rubs, or gallops,no edema  Respiratory:  clear to auscultation bilaterally, normal work of breathing GI: soft, nontender, nondistended, + BS MS: no deformity or atrophy  Skin: warm and dry, device site well healed Neuro:  Strength and sensation are intact Psych: euthymic mood, full affect  EKG:  EKG is ordered today. Personal review of the ekg ordered shows A paced, rate 60, 1 degree AV block  Personal review of the device interrogation today. Results in [place]: 01/20/2017: BUN 22; Creatinine, Ser 1.35; Potassium 4.6; Sodium 141    Lipid Panel     Component Value Date/Time   CHOL 121 09/16/2012 0520   TRIG 174 (H) 09/16/2012 0520   HDL 6 (L) 09/16/2012 0520   CHOLHDL 20.2 09/16/2012 0520   VLDL 35 09/16/2012 0520   LDLCALC 80 09/16/2012 0520     Wt Readings from Last 3 Encounters:  07/27/17 189 lb 9.6 oz (86 kg)  03/24/17 185 lb 3.2 oz (84 kg)  01/13/17 185 lb 12.8 oz (84.3 kg)      Other studies Reviewed: Additional studies/ records that were reviewed today include: 48 hour monitor  Review of the above records today demonstrates:  Minimum: 59 bpm at 9:31 PM Mean: 88 bpm mV Maximum: 134 bpm at 9:44 AM Ventricular ectopy 22.08% of beats Supraventricular events 0.92% of beats Short runs of SVT  TTE 04/26/16 - Left ventricle: The cavity size was normal. Systolic function was   moderately to severely reduced. The estimated ejection fraction   was in the range of 30% to 35%. Diffuse hypokinesis, worse in the   septum. - Aortic valve: Transvalvular velocity was within the normal range.   There was no stenosis. There was no regurgitation. - Mitral valve: A bioprosthesis was present. Pressure half-time: 63   ms. Mean gradient (D): 12 mm Hg. - Left atrium: The atrium was moderately dilated. - Right ventricle: The cavity size was normal. Wall thickness was   normal. Systolic function was moderately  reduced. - Tricuspid valve: There was no regurgitation. - Inferior vena cava: The vessel was normal in size. The   respirophasic diameter changes were in the normal range (>= 50%),   consistent with normal central venous pressure.  Impressions:  - Mean gradient across the mitral valve is significantly increased   compared with 12/03/15. 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 Also, the TVI   ratio is 3.4, consistent with pathologic obstruction. 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   ASSESSMENT AND  PLAN:  1. Junctional Rhythm -dual-chamber ICD implanted 06/11/16.  Currently atrial paced today.  No changes.    2. PAF -continue Coumadin  This patients CHA2DS2-VASc Score and unadjusted Ischemic Stroke Rate (% per year) is equal to 3.2 % stroke rate/year from a score of 3  Above score calculated as 1 point each if present [CHF, HTN, DM, Vascular=MI/PAD/Aortic Plaque, Age if 65-74, or Male] Above score calculated as 2 points each if present [Age > 75, or Stroke/TIA/TE]  3. PVC: PVC burden is greatly reduced.  He is having trouble affording his [name].  We Devi Hopman plan to stop today and continue amiodarone.  We Gibbs Naugle get amiodarone surveillance labs today.  4. S/p MVR -status post bioprosthetic mitral valve replacement.  No changes.  5.  congestive heart failure: Chronic ICD implanted 06/11/16.  Is not having signs or symptoms of volume overload.  Continue with current management.    Current asked .medicines are reviewed at length with the patient today.   The patient does not have concerns regarding his medicines.  The following changes were made today:  none  Labs/ tests ordered today include:  Orders Placed This Encounter  Procedures  . EKG 12-Lead     Disposition:   FU with Naziah Weckerly 6 months  Signed, Caelie [place]  Meredith Leeds, MD  07/27/2017 11:04 AM     [facility] [address] [place] [address] [phone] (office) [phone] (fax)

## 2017-07-28 DIAGNOSIS — N183 Chronic kidney disease, stage 3 (moderate): Secondary | ICD-10-CM | POA: Diagnosis not present

## 2017-07-28 DIAGNOSIS — I10 Essential (primary) hypertension: Secondary | ICD-10-CM | POA: Diagnosis not present

## 2017-07-28 DIAGNOSIS — E119 Type 2 diabetes mellitus without complications: Secondary | ICD-10-CM | POA: Diagnosis not present

## 2017-07-28 DIAGNOSIS — I498 Other specified cardiac arrhythmias: Secondary | ICD-10-CM | POA: Diagnosis not present

## 2017-07-28 DIAGNOSIS — Z7901 Long term (current) use of anticoagulants: Secondary | ICD-10-CM | POA: Diagnosis not present

## 2017-07-28 DIAGNOSIS — E876 Hypokalemia: Secondary | ICD-10-CM | POA: Diagnosis not present

## 2017-07-28 DIAGNOSIS — I739 Peripheral vascular disease, unspecified: Secondary | ICD-10-CM | POA: Diagnosis not present

## 2017-07-28 DIAGNOSIS — K219 Gastro-esophageal reflux disease without esophagitis: Secondary | ICD-10-CM | POA: Diagnosis not present

## 2017-07-28 DIAGNOSIS — E7800 Pure hypercholesterolemia, unspecified: Secondary | ICD-10-CM | POA: Diagnosis not present

## 2017-07-28 LAB — CUP PACEART INCLINIC DEVICE CHECK
Date Time Interrogation Session: 20181108134040
Implantable Lead Implant Date: 20170922
Implantable Lead Implant Date: 20170922
Implantable Lead Location: 753859
Implantable Lead Location: 753860
Implantable Lead Model: 5076
Implantable Pulse Generator Implant Date: 20170922

## 2017-08-30 DIAGNOSIS — I498 Other specified cardiac arrhythmias: Secondary | ICD-10-CM | POA: Diagnosis not present

## 2017-08-30 DIAGNOSIS — J309 Allergic rhinitis, unspecified: Secondary | ICD-10-CM | POA: Diagnosis not present

## 2017-08-30 DIAGNOSIS — I10 Essential (primary) hypertension: Secondary | ICD-10-CM | POA: Diagnosis not present

## 2017-08-30 DIAGNOSIS — E876 Hypokalemia: Secondary | ICD-10-CM | POA: Diagnosis not present

## 2017-08-30 DIAGNOSIS — K219 Gastro-esophageal reflux disease without esophagitis: Secondary | ICD-10-CM | POA: Diagnosis not present

## 2017-08-30 DIAGNOSIS — I739 Peripheral vascular disease, unspecified: Secondary | ICD-10-CM | POA: Diagnosis not present

## 2017-08-30 DIAGNOSIS — N183 Chronic kidney disease, stage 3 (moderate): Secondary | ICD-10-CM | POA: Diagnosis not present

## 2017-08-30 DIAGNOSIS — Z7901 Long term (current) use of anticoagulants: Secondary | ICD-10-CM | POA: Diagnosis not present

## 2017-08-30 DIAGNOSIS — E7800 Pure hypercholesterolemia, unspecified: Secondary | ICD-10-CM | POA: Diagnosis not present

## 2017-09-19 ENCOUNTER — Ambulatory Visit (INDEPENDENT_AMBULATORY_CARE_PROVIDER_SITE_OTHER): Payer: Medicare HMO | Admitting: *Deleted

## 2017-09-19 DIAGNOSIS — I428 Other cardiomyopathies: Secondary | ICD-10-CM | POA: Diagnosis not present

## 2017-09-19 DIAGNOSIS — I5022 Chronic systolic (congestive) heart failure: Secondary | ICD-10-CM

## 2017-09-21 NOTE — Progress Notes (Signed)
Remote ICD transmission.   

## 2017-09-22 LAB — CUP PACEART REMOTE DEVICE CHECK
Battery Remaining Longevity: 111 mo
Battery Voltage: 3.01 V
Brady Statistic AP VP Percent: 0.41 %
Brady Statistic AP VS Percent: 88.07 %
Brady Statistic AS VP Percent: 0.01 %
Brady Statistic AS VS Percent: 11.51 %
Brady Statistic RA Percent Paced: 88.43 %
Brady Statistic RV Percent Paced: 0.49 %
Date Time Interrogation Session: 20181231051804
HighPow Impedance: 64 Ohm
Implantable Lead Implant Date: 20170922
Implantable Lead Implant Date: 20170922
Implantable Lead Location: 753859
Implantable Lead Location: 753860
Implantable Lead Model: 5076
Implantable Pulse Generator Implant Date: 20170922
Lead Channel Impedance Value: 361 Ohm
Lead Channel Impedance Value: 456 Ohm
Lead Channel Impedance Value: 475 Ohm
Lead Channel Pacing Threshold Amplitude: 0.5 V
Lead Channel Pacing Threshold Amplitude: 0.625 V
Lead Channel Pacing Threshold Pulse Width: 0.4 ms
Lead Channel Pacing Threshold Pulse Width: 0.4 ms
Lead Channel Sensing Intrinsic Amplitude: 0.875 mV
Lead Channel Sensing Intrinsic Amplitude: 0.875 mV
Lead Channel Sensing Intrinsic Amplitude: 11 mV
Lead Channel Sensing Intrinsic Amplitude: 11 mV
Lead Channel Setting Pacing Amplitude: 2 V
Lead Channel Setting Pacing Amplitude: 2.5 V
Lead Channel Setting Pacing Pulse Width: 0.4 ms
Lead Channel Setting Sensing Sensitivity: 0.3 mV

## 2017-09-23 ENCOUNTER — Encounter: Payer: Self-pay | Admitting: Cardiology

## 2017-09-29 DIAGNOSIS — M25562 Pain in left knee: Secondary | ICD-10-CM | POA: Diagnosis not present

## 2017-09-29 DIAGNOSIS — E119 Type 2 diabetes mellitus without complications: Secondary | ICD-10-CM | POA: Diagnosis not present

## 2017-09-29 DIAGNOSIS — E7800 Pure hypercholesterolemia, unspecified: Secondary | ICD-10-CM | POA: Diagnosis not present

## 2017-09-29 DIAGNOSIS — Z9181 History of falling: Secondary | ICD-10-CM | POA: Diagnosis not present

## 2017-09-29 DIAGNOSIS — I10 Essential (primary) hypertension: Secondary | ICD-10-CM | POA: Diagnosis not present

## 2017-09-29 DIAGNOSIS — M25462 Effusion, left knee: Secondary | ICD-10-CM | POA: Diagnosis not present

## 2017-09-29 DIAGNOSIS — D5740 Sickle-cell thalassemia without crisis: Secondary | ICD-10-CM | POA: Diagnosis not present

## 2017-09-29 DIAGNOSIS — Z7901 Long term (current) use of anticoagulants: Secondary | ICD-10-CM | POA: Diagnosis not present

## 2017-09-29 DIAGNOSIS — N183 Chronic kidney disease, stage 3 (moderate): Secondary | ICD-10-CM | POA: Diagnosis not present

## 2017-09-29 DIAGNOSIS — M179 Osteoarthritis of knee, unspecified: Secondary | ICD-10-CM | POA: Diagnosis not present

## 2017-09-29 DIAGNOSIS — I739 Peripheral vascular disease, unspecified: Secondary | ICD-10-CM | POA: Diagnosis not present

## 2017-09-30 ENCOUNTER — Ambulatory Visit: Payer: Medicare HMO | Admitting: Family

## 2017-09-30 ENCOUNTER — Encounter: Payer: Self-pay | Admitting: Family

## 2017-09-30 ENCOUNTER — Ambulatory Visit (HOSPITAL_COMMUNITY)
Admission: RE | Admit: 2017-09-30 | Discharge: 2017-09-30 | Disposition: A | Discharge status: Home / Self Care | Payer: Medicare HMO | Source: Ambulatory Visit | Attending: Family | Admitting: Family

## 2017-09-30 VITALS — BP 135/80 | HR 85 | Resp 20 | Ht 72.0 in | Wt 196.0 lb

## 2017-09-30 DIAGNOSIS — I70201 Unspecified atherosclerosis of native arteries of extremities, right leg: Secondary | ICD-10-CM

## 2017-09-30 DIAGNOSIS — I779 Disorder of arteries and arterioles, unspecified: Secondary | ICD-10-CM

## 2017-09-30 NOTE — Progress Notes (Signed)
[facility] [place]   CC: Follow up peripheral artery occlusive disease  History of Present Illness Ryan [name] is a 72 y.o. male patient of Dr. Trula Slade who is back today for followup. Dr. Trula Slade met the patient in the hospital. He was originally referred for possible right leg ischemia. 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 He also had infected hardware removed from his right foot in December 2013. He developed a wound on his right foot. He underwent angiography which revealed an occluded right popliteal artery. We had been managing his wound nonoperatively. He was visiting the wound center frequently and has since been discharged from their care. He is able to ambulate without difficulty although he does use a cane around the house occasionally.   He exercises at a gym 2-3x/week, golfs once/month.  After about 10 minutes of walking his right lower leg feels weak, relieved by rest.   He denies non healing wounds in feet or legs, the right toe ulcer has healed.  Pt denies any history of stroke or TIA.  He takes coumadin s/p mitral valve replacement. His PCP is Dr. Truman Hayward.   Pt Diabetic: 6.0 A1C on 03/04/16, pt does not recall what most recent A1C was.  Pt smoker: non-smoker  Pt meds include: Statin :Yes Betablocker: Yes ASA: No Other anticoagulants/antiplatelets: coumadin, history of atrial fib, s/p mitral valve replacement      Past Medical History:  Diagnosis Date  . Atrial fibrillation ([place])   . 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 Diabetes mellitus without complication ([place])   . Endocarditis   . Hypertension   . Peripheral vascular disease ([place])   . PVC's (premature ventricular contractions)   . SVT (supraventricular tachycardia)  long RP     Social History Social History   Tobacco Use  . Smoking status: Never Smoker  . Smokeless tobacco: Never Used  Substance Use Topics  .  Alcohol use: No    Alcohol/week: 0.0 oz  . Drug use: No    Family History Family History  Problem Relation Age of Onset  . Hypertension Mother   . Hypertension Father     Past Surgical History:  Procedure Laterality Date  . ABDOMINAL AORTAGRAM N/A 10/03/2012   Procedure: ABDOMINAL Maxcine Ham;  Surgeon: [name], MD;  [facility] C[facility] LAB;  Service: Cardiovascular;  Laterality: N/A;  . CORONARY ANGIOGRAM  09/21/2012   Procedure: CORONARY ANGIOGRAM;  Surgeon: [name], MD;  Location: [facility] CATH LAB;  Service: Cardiovascular;;  . EP IMPLANTABLE DEVICE N/A 06/11/2016   Procedure: ICD Implant;  Surgeon: Will Meredith Leeds, MD;  Location: [place] CV LAB;  Service: Cardiovascular;  Laterality: N/A;  . EXTREMITY WIRE/PIN REMOVAL  09/14/2012   Procedure: REMOVAL K-WIRE/PIN EXTREMITY;  Surgeon: [name], MD;  Location: [place];  Service: Orthopedics;  Laterality: Right;  Right Foot  . I&D EXTREMITY  09/14/2012   Procedure: IRRIGATION AND DEBRIDEMENT EXTREMITY;  Surgeon: Tennis Must, MD;  Location: [place];  Service: Orthopedics;  Laterality: Right;  . INTRAOPERATIVE TRANSESOPHAGEAL ECHOCARDIOGRAM  09/26/2012   Procedure: INTRAOPERATIVE TRANSESOPHAGEAL ECHOCARDIOGRAM;  Surgeon: [name], MD;  Location[facility];  Service: Open Heart Surgery;  Laterality: N/A;  . MITRAL VALVE REPLACEMENT  09/26/2012   Procedure: MITRAL VALVE (MV) REPLACEMENT;  Surgeon: [name], MD;  Location: [place] OR;  Service: Open Heart Surgery;  Laterality: N/A;  . RIGHT HEART CATHETERIZATION  09/21/2012   Procedure: RIGHT HEART CATH;  Surgeon: [name], MD;  Location: [facility] CATH LAB;  Service: Cardiovascular;;  . TEE WITHOUT CARDIOVERSION  09/18/2012   Procedure: TRANSESOPHAGEAL ECHOCARDIOGRAM (TEE);  Surgeon: [name], MD;  Location[facility] ENDOSCOPY;  Service: Cardiovascular;  Laterality: N/A;    Allergies  Allergen Reactions  . Geralyn Flash [Fish Allergy] Nausea And Vomiting    Current Outpatient  Medications  Medication Sig Dispense Refill  . amiodarone (PACERONE) 200 MG tablet TAKE 1 TABLET BY MOUTH EVERY DAY (START IN ABOUT 2 WEEKS AFTER FINISHING 400MG) 30 tablet 10  . atorvastatin (LIPITOR) 40 MG tablet Take 1 tablet (40 mg total) by mouth daily at 6 PM. 30 tablet 5  . HYDROcodone-acetaminophen (NORCO/VICODIN) 5-325 MG per tablet Take 1 tablet by mouth 2 (two) times daily as needed for moderate pain.     . metoprolol succinate (TOPROL-XL) 25 MG 24 hr tablet Take 25 mg by mouth daily.    . pantoprazole (PROTONIX) 40 MG tablet Take 40 mg by mouth daily.     . potassium chloride SA (K-DUR,KLOR-CON) 20 MEQ tablet Take 20 mEq by mouth daily.     . sildenafil (VIAGRA) 100 MG tablet Take 1 tablet (100 mg total) by mouth as needed for erectile dysfunction. 10 tablet 0  . triamcinolone cream (KENALOG) 0.5 % Apply 1 application topically as needed (SKIN).     . valsartan-hydrochlorothiazide (DIOVAN HCT) 80-12.5 MG tablet Take 1 tablet by mouth daily. 30 tablet 11  . warfarin (COUMADIN) 4 MG tablet Take 4 mg by mouth daily.     No current facility-administered medications for this visit.     ROS: See HPI for pertinent positives and negatives.   Physical Examination  Vitals:   09/30/17 1136  BP: 135/80  Pulse: 85  Resp: 20  SpO2: 99%  Weight: 196 lb (88.9 kg)  Height: 6' (1.829 m)   Body mass index is 26.58 kg/m.  General: A&O x 3, WDWN. Gait: normal Eyes: Pupils equal Pulmonary: CTAB, without wheezes , rales or rhonchi. Cardiac: regular rhythm and rate, no detected murmur, AICD palpated left upper chest.     Carotid Bruits Left Right   Negative Negative  Aorta is not palpable. Radial pulses: are 2+ palpable and =   VASCULAR EXAM: Extremitieswithout ischemic changes  without Gangrene. Right second toe ecchymosis is resolving, no swelling, but he does have a slow to heal 3 mm diameter shallow ulcer that is being irritated by a  small bony prominence at his great toe.      LE Pulses LEFT RIGHT   FEMORAL 2+ palpable 2+ palpable    POPLITEAL not palpable  not palpable   POSTERIOR TIBIAL not palpable  not palpable    DORSALIS PEDIS  ANTERIOR TIBIAL not palpable  not palpable    Abdomen: soft, NT, no palpable masses. Skin: no rashes. Musculoskeletal: no muscle wasting or atrophy. Neurologic: A&O X 3; Appropriate Affect ; SENSATION: normal; MOTOR FUNCTION: moving all extremities equally, motor strength 5/5 throughout. Speech is fluent/normal. CN 2-12 intact    ASSESSMENT: Ryan [name] is a 72 y.o. 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 He remains active.   Right toe ulcer has completely healed, no signs of ischemia in his feet or legs.  He takes a statin and coumadin. Fortunately he does not have DM and has never used tobacco.   DATA  ABI (Date: 09/30/2017):  R:   ABI:  1.63 (was 0.72 on 03-24-17),   PT: mono  DP: mono  TBI:  0.48 (was 0.53)  L:   ABI: [place] (was [place]),   PT: tri  DP: mono  TBI: 0.58 (was 0.60) Unable to obtain reliable ABI due to lack of correlation between ankle pressures and waveforms, which is most likely a result of medial calcification.   PLAN:  Graduated walking program discussed and how to achieve.  Based on the patient's vascular studies and examination, pt will return to clinic in 6 months with ABI's. I advised him to notify us if he develops concerns re the circulation in his feet or legs.   I discussed in depth with the patient the nature of atherosclerosis, and emphasized the importance of maximal medical management including strict control of blood pressure, blood glucose, and lipid levels,  obtaining regular exercise, and continued cessation of smoking.  The patient is aware that without maximal medical management the underlying atherosclerotic disease process will progress, limiting the benefit of any interventions.  The patient was given information about PAD including signs, symptoms, treatment, what symptoms should prompt the patient to seek immediate medical care, and risk reduction measures to take.  Clemon Chambers, RN, MSN, FNP-C [facility] Phone: [phone]  Clinic MD: Chen/Cain  09/30/17 12:17 PM

## 2017-09-30 NOTE — Patient Instructions (Signed)

## 2017-10-03 NOTE — Addendum Note (Signed)
Addended by: Lianne Cure A on: 10/03/2017 02:09 PM   Modules accepted: Orders

## 2017-10-06 DIAGNOSIS — I739 Peripheral vascular disease, unspecified: Secondary | ICD-10-CM | POA: Diagnosis not present

## 2017-10-06 DIAGNOSIS — I498 Other specified cardiac arrhythmias: Secondary | ICD-10-CM | POA: Diagnosis not present

## 2017-10-06 DIAGNOSIS — M25562 Pain in left knee: Secondary | ICD-10-CM | POA: Diagnosis not present

## 2017-10-06 DIAGNOSIS — E7800 Pure hypercholesterolemia, unspecified: Secondary | ICD-10-CM | POA: Diagnosis not present

## 2017-10-06 DIAGNOSIS — I10 Essential (primary) hypertension: Secondary | ICD-10-CM | POA: Diagnosis not present

## 2017-10-06 DIAGNOSIS — E119 Type 2 diabetes mellitus without complications: Secondary | ICD-10-CM | POA: Diagnosis not present

## 2017-10-06 DIAGNOSIS — N183 Chronic kidney disease, stage 3 (moderate): Secondary | ICD-10-CM | POA: Diagnosis not present

## 2017-10-06 DIAGNOSIS — K219 Gastro-esophageal reflux disease without esophagitis: Secondary | ICD-10-CM | POA: Diagnosis not present

## 2017-10-06 DIAGNOSIS — J309 Allergic rhinitis, unspecified: Secondary | ICD-10-CM | POA: Diagnosis not present

## 2017-10-23 DIAGNOSIS — H6120 Impacted cerumen, unspecified ear: Secondary | ICD-10-CM | POA: Diagnosis not present

## 2017-10-23 DIAGNOSIS — J0100 Acute maxillary sinusitis, unspecified: Secondary | ICD-10-CM | POA: Diagnosis not present

## 2017-11-03 DIAGNOSIS — J208 Acute bronchitis due to other specified organisms: Secondary | ICD-10-CM | POA: Diagnosis not present

## 2017-11-03 DIAGNOSIS — N529 Male erectile dysfunction, unspecified: Secondary | ICD-10-CM | POA: Diagnosis not present

## 2017-11-03 DIAGNOSIS — I739 Peripheral vascular disease, unspecified: Secondary | ICD-10-CM | POA: Diagnosis not present

## 2017-11-03 DIAGNOSIS — J309 Allergic rhinitis, unspecified: Secondary | ICD-10-CM | POA: Diagnosis not present

## 2017-11-03 DIAGNOSIS — N183 Chronic kidney disease, stage 3 (moderate): Secondary | ICD-10-CM | POA: Diagnosis not present

## 2017-11-03 DIAGNOSIS — Z7901 Long term (current) use of anticoagulants: Secondary | ICD-10-CM | POA: Diagnosis not present

## 2017-11-03 DIAGNOSIS — E119 Type 2 diabetes mellitus without complications: Secondary | ICD-10-CM | POA: Diagnosis not present

## 2017-11-03 DIAGNOSIS — E7800 Pure hypercholesterolemia, unspecified: Secondary | ICD-10-CM | POA: Diagnosis not present

## 2017-11-03 DIAGNOSIS — K219 Gastro-esophageal reflux disease without esophagitis: Secondary | ICD-10-CM | POA: Diagnosis not present

## 2017-11-03 DIAGNOSIS — I10 Essential (primary) hypertension: Secondary | ICD-10-CM | POA: Diagnosis not present

## 2017-11-18 DIAGNOSIS — I498 Other specified cardiac arrhythmias: Secondary | ICD-10-CM | POA: Diagnosis not present

## 2017-11-18 DIAGNOSIS — E119 Type 2 diabetes mellitus without complications: Secondary | ICD-10-CM | POA: Diagnosis not present

## 2017-11-18 DIAGNOSIS — I10 Essential (primary) hypertension: Secondary | ICD-10-CM | POA: Diagnosis not present

## 2017-11-18 DIAGNOSIS — I739 Peripheral vascular disease, unspecified: Secondary | ICD-10-CM | POA: Diagnosis not present

## 2017-11-18 DIAGNOSIS — N529 Male erectile dysfunction, unspecified: Secondary | ICD-10-CM | POA: Diagnosis not present

## 2017-11-18 DIAGNOSIS — E7800 Pure hypercholesterolemia, unspecified: Secondary | ICD-10-CM | POA: Diagnosis not present

## 2017-11-18 DIAGNOSIS — E876 Hypokalemia: Secondary | ICD-10-CM | POA: Diagnosis not present

## 2017-11-18 DIAGNOSIS — N183 Chronic kidney disease, stage 3 (moderate): Secondary | ICD-10-CM | POA: Diagnosis not present

## 2017-11-18 DIAGNOSIS — K219 Gastro-esophageal reflux disease without esophagitis: Secondary | ICD-10-CM | POA: Diagnosis not present

## 2017-12-14 DIAGNOSIS — H524 Presbyopia: Secondary | ICD-10-CM | POA: Diagnosis not present

## 2017-12-19 ENCOUNTER — Ambulatory Visit (INDEPENDENT_AMBULATORY_CARE_PROVIDER_SITE_OTHER): Payer: Medicare HMO | Admitting: *Deleted

## 2017-12-19 ENCOUNTER — Telehealth: Payer: Self-pay | Admitting: Cardiology

## 2017-12-19 DIAGNOSIS — I5022 Chronic systolic (congestive) heart failure: Secondary | ICD-10-CM

## 2017-12-19 DIAGNOSIS — I428 Other cardiomyopathies: Secondary | ICD-10-CM

## 2017-12-19 NOTE — Telephone Encounter (Signed)
Spoke with pt and reminded pt of remote transmission that is due today. Pt verbalized understanding.   

## 2017-12-21 DIAGNOSIS — E876 Hypokalemia: Secondary | ICD-10-CM | POA: Diagnosis not present

## 2017-12-21 DIAGNOSIS — I10 Essential (primary) hypertension: Secondary | ICD-10-CM | POA: Diagnosis not present

## 2017-12-21 DIAGNOSIS — N183 Chronic kidney disease, stage 3 (moderate): Secondary | ICD-10-CM | POA: Diagnosis not present

## 2017-12-21 DIAGNOSIS — N529 Male erectile dysfunction, unspecified: Secondary | ICD-10-CM | POA: Diagnosis not present

## 2017-12-21 DIAGNOSIS — K219 Gastro-esophageal reflux disease without esophagitis: Secondary | ICD-10-CM | POA: Diagnosis not present

## 2017-12-21 DIAGNOSIS — I498 Other specified cardiac arrhythmias: Secondary | ICD-10-CM | POA: Diagnosis not present

## 2017-12-21 DIAGNOSIS — I739 Peripheral vascular disease, unspecified: Secondary | ICD-10-CM | POA: Diagnosis not present

## 2017-12-21 DIAGNOSIS — E119 Type 2 diabetes mellitus without complications: Secondary | ICD-10-CM | POA: Diagnosis not present

## 2017-12-21 DIAGNOSIS — M25562 Pain in left knee: Secondary | ICD-10-CM | POA: Diagnosis not present

## 2017-12-22 ENCOUNTER — Encounter: Payer: Self-pay | Admitting: Cardiology

## 2017-12-22 ENCOUNTER — Telehealth: Payer: Self-pay | Admitting: *Deleted

## 2017-12-22 NOTE — Telephone Encounter (Signed)
Informed pt that transmission was received.  

## 2017-12-22 NOTE — Telephone Encounter (Signed)
°  1. Has your device fired? no ° °2. Is you device beeping? no ° °3. Are you experiencing draining or swelling at device site?no ° °4. Are you calling to see if we received your device transmission?yes ° °5. Have you passed out? no ° ° ° °Please route to Device Clinic Pool °

## 2017-12-22 NOTE — Progress Notes (Signed)
Remote ICD transmission.   

## 2017-12-23 LAB — CUP PACEART REMOTE DEVICE CHECK
Battery Remaining Longevity: 107 mo
Battery Voltage: 2.99 V
Brady Statistic AP VP Percent: 0.45 %
Brady Statistic AP VS Percent: 87.64 %
Brady Statistic AS VP Percent: 0.01 %
Brady Statistic AS VS Percent: 11.9 %
Brady Statistic RA Percent Paced: 88.03 %
Brady Statistic RV Percent Paced: 0.51 %
Date Time Interrogation Session: 20190404175218
HighPow Impedance: 68 Ohm
Implantable Lead Implant Date: 20170922
Implantable Lead Implant Date: 20170922
Implantable Lead Location: 753859
Implantable Lead Location: 753860
Implantable Lead Model: 5076
Implantable Pulse Generator Implant Date: 20170922
Lead Channel Impedance Value: 418 Ohm
Lead Channel Impedance Value: 456 Ohm
Lead Channel Impedance Value: 475 Ohm
Lead Channel Pacing Threshold Amplitude: 0.625 V
Lead Channel Pacing Threshold Amplitude: 0.75 V
Lead Channel Pacing Threshold Pulse Width: 0.4 ms
Lead Channel Pacing Threshold Pulse Width: 0.4 ms
Lead Channel Sensing Intrinsic Amplitude: 11.5 mV
Lead Channel Sensing Intrinsic Amplitude: 11.5 mV
Lead Channel Sensing Intrinsic Amplitude: 4.625 mV
Lead Channel Sensing Intrinsic Amplitude: 4.625 mV
Lead Channel Setting Pacing Amplitude: 2 V
Lead Channel Setting Pacing Amplitude: 2.5 V
Lead Channel Setting Pacing Pulse Width: 0.4 ms
Lead Channel Setting Sensing Sensitivity: 0.3 mV

## 2017-12-31 ENCOUNTER — Other Ambulatory Visit: Payer: Self-pay | Admitting: Interventional Cardiology

## 2018-01-04 DIAGNOSIS — I739 Peripheral vascular disease, unspecified: Secondary | ICD-10-CM | POA: Diagnosis not present

## 2018-01-04 DIAGNOSIS — Z7901 Long term (current) use of anticoagulants: Secondary | ICD-10-CM | POA: Diagnosis not present

## 2018-01-04 DIAGNOSIS — I498 Other specified cardiac arrhythmias: Secondary | ICD-10-CM | POA: Diagnosis not present

## 2018-01-04 DIAGNOSIS — E876 Hypokalemia: Secondary | ICD-10-CM | POA: Diagnosis not present

## 2018-01-04 DIAGNOSIS — I10 Essential (primary) hypertension: Secondary | ICD-10-CM | POA: Diagnosis not present

## 2018-01-04 DIAGNOSIS — N529 Male erectile dysfunction, unspecified: Secondary | ICD-10-CM | POA: Diagnosis not present

## 2018-01-04 DIAGNOSIS — E119 Type 2 diabetes mellitus without complications: Secondary | ICD-10-CM | POA: Diagnosis not present

## 2018-01-04 DIAGNOSIS — N183 Chronic kidney disease, stage 3 (moderate): Secondary | ICD-10-CM | POA: Diagnosis not present

## 2018-01-04 DIAGNOSIS — K219 Gastro-esophageal reflux disease without esophagitis: Secondary | ICD-10-CM | POA: Diagnosis not present

## 2018-01-18 ENCOUNTER — Encounter: Payer: Self-pay | Admitting: Cardiology

## 2018-01-18 ENCOUNTER — Ambulatory Visit (INDEPENDENT_AMBULATORY_CARE_PROVIDER_SITE_OTHER): Payer: Medicare HMO | Admitting: Cardiology

## 2018-01-18 VITALS — BP 138/80 | HR 83 | Ht 72.0 in | Wt 197.2 lb

## 2018-01-18 DIAGNOSIS — I480 Paroxysmal atrial fibrillation: Secondary | ICD-10-CM

## 2018-01-18 DIAGNOSIS — I428 Other cardiomyopathies: Secondary | ICD-10-CM | POA: Diagnosis not present

## 2018-01-18 DIAGNOSIS — Z79899 Other long term (current) drug therapy: Secondary | ICD-10-CM | POA: Diagnosis not present

## 2018-01-18 DIAGNOSIS — I493 Ventricular premature depolarization: Secondary | ICD-10-CM | POA: Diagnosis not present

## 2018-01-18 LAB — CUP PACEART INCLINIC DEVICE CHECK
Battery Remaining Longevity: 105 mo
Battery Voltage: 3.01 V
Brady Statistic AP VP Percent: 0.43 %
Brady Statistic AP VS Percent: 87.96 %
Brady Statistic AS VP Percent: 0.01 %
Brady Statistic AS VS Percent: 11.6 %
Brady Statistic RA Percent Paced: 88.32 %
Brady Statistic RV Percent Paced: 0.49 %
Date Time Interrogation Session: 20190501121920
HighPow Impedance: 63 Ohm
Implantable Lead Implant Date: 20170922
Implantable Lead Implant Date: 20170922
Implantable Lead Location: 753859
Implantable Lead Location: 753860
Implantable Lead Model: 5076
Implantable Pulse Generator Implant Date: 20170922
Lead Channel Impedance Value: 399 Ohm
Lead Channel Impedance Value: 418 Ohm
Lead Channel Impedance Value: 475 Ohm
Lead Channel Pacing Threshold Amplitude: 0.625 V
Lead Channel Pacing Threshold Amplitude: 0.75 V
Lead Channel Pacing Threshold Pulse Width: 0.4 ms
Lead Channel Pacing Threshold Pulse Width: 0.4 ms
Lead Channel Sensing Intrinsic Amplitude: 0.875 mV
Lead Channel Sensing Intrinsic Amplitude: 10 mV
Lead Channel Sensing Intrinsic Amplitude: 11.125 mV
Lead Channel Sensing Intrinsic Amplitude: 3.5 mV
Lead Channel Setting Pacing Amplitude: 2 V
Lead Channel Setting Pacing Amplitude: 2.5 V
Lead Channel Setting Pacing Pulse Width: 0.4 ms
Lead Channel Setting Sensing Sensitivity: 0.3 mV

## 2018-01-18 NOTE — Progress Notes (Signed)
Electrophysiology Office Note   Date:  01/18/2018   ID:  [name], Ryan 06/23/1946, MRN [id number]  PCP:  [name], MD  Cardiologist:  Pernell Dupre Primary Electrophysiologist:  Zayli Villafuerte Meredith Leeds, MD    Chief Complaint  Patient presents with  . Defib Check    PAF/PVC's/Ischemic cardipmyopathy/Chronic systolic HF     History of Present Illness: Ryan [name] is a 72 y.o. male who presents today for electrophysiology evaluation.   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   He has been having a high volume of PVCs. His most recent monitor showed that his PVC burden was up to 20%. He had an echocardiogram showed an EF of 30-35%, which could be coming from his PVCs. He was put on [name] see if we can decrease his PVCs which may help his low EF. He was in accelerated junctional rhythm when getting his most recent TTE.  Today, denies symptoms of palpitations, chest pain, shortness of breath, orthopnea, PND, lower extremity edema, claudication, dizziness, presyncope, syncope, bleeding, or neurologic sequela. The patient is tolerating medications without difficulties.  He is feeling well.  He does not have any major complaints today.   Past Medical History:  Diagnosis Date  . Atrial fibrillation ([place])   . 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 Diabetes mellitus without complication ([place])   . Endocarditis   . Hypertension   . Peripheral vascular disease ([place])   . PVC's (premature ventricular contractions)   . SVT (supraventricular tachycardia)  long RP    Past Surgical History:  Procedure Laterality Date  . ABDOMINAL AORTAGRAM N/A 10/03/2012   Procedure: ABDOMINAL Maxcine Ham;  Surgeon: [name], MD;  Location: [facility] C[facility] LAB;  Service: Cardiovascular;  Laterality: N/A;  . CORONARY ANGIOGRAM  09/21/2012   Procedure: CORONARY ANGIOGRAM;  Surgeon: [name],  MD;  Location: [facility] CATH LAB;  Service: Cardiovascular;;  . EP IMPLANTABLE DEVICE N/A 06/11/2016   Procedure: ICD Implant;  Surgeon: Ricky Doan Meredith Leeds, MD;  Location: [place] CV LAB;  Service: Cardiovascular;  Laterality: N/A;  . EXTREMITY WIRE/PIN REMOVAL  09/14/2012   Procedure: REMOVAL K-WIRE/PIN EXTREMITY;  Surgeon: [name], MD;  Location: [place];  Service: Orthopedics;  Laterality: Right;  Right Foot  . I&D EXTREMITY  09/14/2012   Procedure: IRRIGATION AND DEBRIDEMENT EXTREMITY;  Surgeon: Tennis Must, MD;  Location: [place];  Service: Orthopedics;  Laterality: Right;  . INTRAOPERATIVE TRANSESOPHAGEAL ECHOCARDIOGRAM  09/26/2012   Procedure: INTRAOPERATIVE TRANSESOPHAGEAL ECHOCARDIOGRAM;  Surgeon: [name], MD;  Location: [facility] OR;  Service: Open Heart Surgery;  Laterality: N/A;  . MITRAL VALVE REPLACEMENT  09/26/2012   Procedure: MITRAL VALVE (MV) REPLACEMENT;  Surgeon: [name], MD;  Location: [place] OR;  Service: Open Heart Surgery;  Laterality: N/A;  . RIGHT HEART CATHETERIZATION  09/21/2012   Procedure: RIGHT HEART CATH;  Surgeon: [name], MD;  Location: [facility] CATH LAB;  Service: Cardiovascular;;  . TEE WITHOUT CARDIOVERSION  09/18/2012   Procedure: TRANSESOPHAGEAL ECHOCARDIOGRAM (TEE);  Surgeon: [name], MD;  Location: [facility] ENDOSCOPY;  Service: Cardiovascular;  Laterality: N/A;     Current Outpatient Medications  Medication Sig Dispense Refill  . amiodarone (PACERONE) 200 MG tablet Take 1 tablet (200 mg total) by mouth daily. Please keep upcoming appt for future refills. Thank you 30 tablet 5  . atorvastatin (LIPITOR) 40 MG tablet Take  1 tablet (40 mg total) by mouth daily at 6 PM. 30 tablet 5  . HYDROcodone-acetaminophen (NORCO/VICODIN) 5-325 MG per tablet Take 1 tablet by mouth 2 (two) times daily as needed for moderate pain.     . metoprolol succinate (TOPROL-XL) 25 MG 24 hr tablet Take 25 mg by mouth daily.    . pantoprazole (PROTONIX) 40 MG tablet Take 40 mg by  mouth daily.     . potassium chloride SA (K-DUR,KLOR-CON) 20 MEQ tablet Take 20 mEq by mouth daily.     . sildenafil (VIAGRA) 100 MG tablet Take 1 tablet (100 mg total) by mouth as needed for erectile dysfunction. 10 tablet 0  . triamcinolone cream (KENALOG) 0.5 % Apply 1 application topically as needed (SKIN).     . valsartan-hydrochlorothiazide (DIOVAN HCT) 80-12.5 MG tablet Take 1 tablet by mouth daily. 30 tablet 11  . warfarin (COUMADIN) 4 MG tablet Take 4 mg by mouth daily.     No current facility-administered medications for this visit.     Allergies:   [name] allergy]   Social History:  The patient  reports that he has never smoked. He has never used smokeless tobacco. He reports that he does not drink alcohol or use drugs.   Family History:  The patient's family history includes Hypertension in his father and mother.    ROS:  Please see the history of present illness.   Otherwise, review of systems is positive for none.   All other systems are reviewed and negative.   PHYSICAL EXAM: VS:  BP 138/80   Pulse 83   Ht 6' (1.829 m)   Wt 197 lb 3.2 oz (89.4 kg)   BMI 26.75 kg/m  , BMI Body mass index is 26.75 kg/m. GEN: Well nourished, well developed, in no acute distress  HEENT: normal  Neck: no JVD, carotid bruits, or masses Cardiac: RRR; no murmurs, rubs, or gallops,no edema  Respiratory:  clear to auscultation bilaterally, normal work of breathing GI: soft, nontender, nondistended, + BS MS: no deformity or atrophy  Skin: warm and dry, device site well healed Neuro:  Strength and sensation are intact Psych: euthymic mood, full affect  EKG:  EKG is ordered today. Personal review of the ekg ordered shows A paced, 1dAVB, nonspecific T wave changes  Personal review of the device interrogation today. Results in [place]: 01/20/2017: BUN 22; Creatinine, Ser 1.35; Potassium 4.6; Sodium 141 07/27/2017: ALT 35; TSH 2.740    Lipid Panel     Component Value  Date/Time   CHOL 121 09/16/2012 0520   TRIG 174 (H) 09/16/2012 0520   HDL 6 (L) 09/16/2012 0520   CHOLHDL 20.2 09/16/2012 0520   VLDL 35 09/16/2012 0520   LDLCALC 80 09/16/2012 0520     Wt Readings from Last 3 Encounters:  01/18/18 197 lb 3.2 oz (89.4 kg)  09/30/17 196 lb (88.9 kg)  07/27/17 189 lb 9.6 oz (86 kg)      Other studies Reviewed: Additional studies/ records that were reviewed today include: 48 hour monitor  Review of the above records today demonstrates:  Minimum: 59 bpm at 9:31 PM Mean: 88 bpm mV Maximum: 134 bpm at 9:44 AM Ventricular ectopy 22.08% of beats Supraventricular events 0.92% of beats Short runs of SVT  TTE 04/26/16 - Left ventricle: The cavity size was normal. Systolic function was   moderately to severely reduced. The estimated ejection fraction   was in the range of 30% to 35%. Diffuse  hypokinesis, worse in the   septum. - Aortic valve: Transvalvular velocity was within the normal range.   There was no stenosis. There was no regurgitation. - Mitral valve: A bioprosthesis was present. Pressure half-time: 63   ms. Mean gradient (D): 12 mm Hg. - Left atrium: The atrium was moderately dilated. - Right ventricle: The cavity size was normal. Wall thickness was   normal. Systolic function was moderately reduced. - Tricuspid valve: There was no regurgitation. - Inferior vena cava: The vessel was normal in size. The   respirophasic diameter changes were in the normal range (>= 50%),   consistent with normal central venous pressure.  Impressions:  - Mean gradient across the mitral valve is significantly increased   compared with 12/03/15. 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 Also, the TVI   ratio is 3.4, consistent with pathologic obstruction. 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     ASSESSMENT AND PLAN:  1. Junctional Rhythm: Status post Medtronic dual-chamber ICD implanted 06/11/2016.  Functioning appropriately.  No changes.    2. PAF -on device interrogation.  Continue Coumadin  This patients CHA2DS2-VASc Score and unadjusted Ischemic Stroke Rate (% per year) is equal to 3.2 % stroke rate/year from a score of 3  Above score calculated as 1 point each if present [CHF, HTN, DM, Vascular=MI/PAD/Aortic Plaque, Age if 65-74, or Male] Above score calculated as 2 points each if present [Age > 75, or Stroke/TIA/TE]  3. PVC: Burden greatly reduced.  Continue amiodarone.  4. S/p MVR -feeling well without issue.  No changes.  5.  congestive heart failure: Status post Medtronic ICD.  Currently on optimal medical therapy.  No changes.  Current asked .medicines are reviewed at length with the patient today.   The patient does not have concerns regarding his medicines.  The following changes were made today:  none  Labs/ tests ordered today include:  Orders Placed This Encounter  Procedures  . EKG 12-Lead     Disposition:   FU with Orestes Geiman 6 months  Signed, Perlie Stene Meredith Leeds, MD  01/18/2018 11:10 AM     [facility] [address] [place] [address] [phone] (office) [phone] (fax)

## 2018-01-18 NOTE — Addendum Note (Signed)
Addended by: Stanton Kidney on: 01/18/2018 11:51 AM   Modules accepted: Orders

## 2018-01-18 NOTE — Patient Instructions (Addendum)
Medication Instructions:  Your physician recommends that you continue on your current medications as directed. Please refer to the Current Medication list given to you today.  *If you need a refill on your cardiac medications before your next appointment, please call your pharmacy*  Labwork: Amiodarone surveillance lab work today: TSH & LFTs  Testing/Procedures: None ordered  Follow-Up: Remote monitoring is used to monitor your Pacemaker or ICD from home. This monitoring reduces the number of office visits required to check your device to one time per year. It allows [place] to keep an eye on the functioning of your device to ensure it is working properly. You are scheduled for a device check from home on 03/20/2018. You may send your transmission at any time that day. If you have a wireless device, the transmission will be sent automatically. After your physician reviews your transmission, you will receive a postcard with your next transmission date.  Your physician wants you to follow-up in: 6 months with Dr. [name].  You will receive a reminder letter in the mail two months in advance. If you don't receive a letter, please call our office to schedule the follow-up appointment.  Thank you for choosing [facility]!!   Trinidad Curet, RN [phone]  Any Other Special Instructions Will Be Listed Below (If Applicable).

## 2018-01-19 LAB — HEPATIC FUNCTION PANEL
ALT: 33 IU/L (ref 0–44)
AST: 37 IU/L (ref 0–40)
Albumin: 4 g/dL (ref 3.5–4.8)
Alkaline Phosphatase: 68 IU/L (ref 39–117)
Bilirubin Total: 0.7 mg/dL (ref 0.0–1.2)
Bilirubin, Direct: 0.19 mg/dL (ref 0.00–0.40)
Total Protein: 6.6 g/dL (ref 6.0–8.5)

## 2018-01-19 LAB — TSH: TSH: 2.61 u[IU]/mL (ref 0.450–4.500)

## 2018-02-07 DIAGNOSIS — M159 Polyosteoarthritis, unspecified: Secondary | ICD-10-CM | POA: Diagnosis not present

## 2018-02-07 DIAGNOSIS — Z7901 Long term (current) use of anticoagulants: Secondary | ICD-10-CM | POA: Diagnosis not present

## 2018-02-07 DIAGNOSIS — E785 Hyperlipidemia, unspecified: Secondary | ICD-10-CM | POA: Diagnosis not present

## 2018-02-07 DIAGNOSIS — K219 Gastro-esophageal reflux disease without esophagitis: Secondary | ICD-10-CM | POA: Diagnosis not present

## 2018-02-07 DIAGNOSIS — I10 Essential (primary) hypertension: Secondary | ICD-10-CM | POA: Diagnosis not present

## 2018-02-07 DIAGNOSIS — E119 Type 2 diabetes mellitus without complications: Secondary | ICD-10-CM | POA: Diagnosis not present

## 2018-02-07 DIAGNOSIS — Z0000 Encounter for general adult medical examination without abnormal findings: Secondary | ICD-10-CM | POA: Diagnosis not present

## 2018-02-07 DIAGNOSIS — Z9181 History of falling: Secondary | ICD-10-CM | POA: Diagnosis not present

## 2018-02-07 DIAGNOSIS — N183 Chronic kidney disease, stage 3 (moderate): Secondary | ICD-10-CM | POA: Diagnosis not present

## 2018-02-17 DIAGNOSIS — I739 Peripheral vascular disease, unspecified: Secondary | ICD-10-CM | POA: Diagnosis not present

## 2018-02-17 DIAGNOSIS — N529 Male erectile dysfunction, unspecified: Secondary | ICD-10-CM | POA: Diagnosis not present

## 2018-02-17 DIAGNOSIS — E876 Hypokalemia: Secondary | ICD-10-CM | POA: Diagnosis not present

## 2018-02-17 DIAGNOSIS — M25562 Pain in left knee: Secondary | ICD-10-CM | POA: Diagnosis not present

## 2018-02-17 DIAGNOSIS — I498 Other specified cardiac arrhythmias: Secondary | ICD-10-CM | POA: Diagnosis not present

## 2018-02-17 DIAGNOSIS — N183 Chronic kidney disease, stage 3 (moderate): Secondary | ICD-10-CM | POA: Diagnosis not present

## 2018-02-17 DIAGNOSIS — K219 Gastro-esophageal reflux disease without esophagitis: Secondary | ICD-10-CM | POA: Diagnosis not present

## 2018-02-17 DIAGNOSIS — J309 Allergic rhinitis, unspecified: Secondary | ICD-10-CM | POA: Diagnosis not present

## 2018-02-17 DIAGNOSIS — M109 Gout, unspecified: Secondary | ICD-10-CM | POA: Diagnosis not present

## 2018-02-20 DIAGNOSIS — K219 Gastro-esophageal reflux disease without esophagitis: Secondary | ICD-10-CM | POA: Diagnosis not present

## 2018-02-20 DIAGNOSIS — I739 Peripheral vascular disease, unspecified: Secondary | ICD-10-CM | POA: Diagnosis not present

## 2018-02-20 DIAGNOSIS — I498 Other specified cardiac arrhythmias: Secondary | ICD-10-CM | POA: Diagnosis not present

## 2018-02-20 DIAGNOSIS — M25562 Pain in left knee: Secondary | ICD-10-CM | POA: Diagnosis not present

## 2018-02-20 DIAGNOSIS — N183 Chronic kidney disease, stage 3 (moderate): Secondary | ICD-10-CM | POA: Diagnosis not present

## 2018-02-20 DIAGNOSIS — N529 Male erectile dysfunction, unspecified: Secondary | ICD-10-CM | POA: Diagnosis not present

## 2018-02-20 DIAGNOSIS — M109 Gout, unspecified: Secondary | ICD-10-CM | POA: Diagnosis not present

## 2018-02-20 DIAGNOSIS — E876 Hypokalemia: Secondary | ICD-10-CM | POA: Diagnosis not present

## 2018-02-20 DIAGNOSIS — J309 Allergic rhinitis, unspecified: Secondary | ICD-10-CM | POA: Diagnosis not present

## 2018-02-23 DIAGNOSIS — E119 Type 2 diabetes mellitus without complications: Secondary | ICD-10-CM | POA: Diagnosis not present

## 2018-02-26 ENCOUNTER — Other Ambulatory Visit: Payer: Self-pay | Admitting: Interventional Cardiology

## 2018-02-27 DIAGNOSIS — M25561 Pain in right knee: Secondary | ICD-10-CM | POA: Diagnosis not present

## 2018-02-27 DIAGNOSIS — N183 Chronic kidney disease, stage 3 (moderate): Secondary | ICD-10-CM | POA: Diagnosis not present

## 2018-02-27 DIAGNOSIS — I10 Essential (primary) hypertension: Secondary | ICD-10-CM | POA: Diagnosis not present

## 2018-02-27 DIAGNOSIS — M159 Polyosteoarthritis, unspecified: Secondary | ICD-10-CM | POA: Diagnosis not present

## 2018-02-27 DIAGNOSIS — E876 Hypokalemia: Secondary | ICD-10-CM | POA: Diagnosis not present

## 2018-02-27 DIAGNOSIS — I739 Peripheral vascular disease, unspecified: Secondary | ICD-10-CM | POA: Diagnosis not present

## 2018-02-27 DIAGNOSIS — N529 Male erectile dysfunction, unspecified: Secondary | ICD-10-CM | POA: Diagnosis not present

## 2018-02-27 DIAGNOSIS — K219 Gastro-esophageal reflux disease without esophagitis: Secondary | ICD-10-CM | POA: Diagnosis not present

## 2018-02-27 DIAGNOSIS — I498 Other specified cardiac arrhythmias: Secondary | ICD-10-CM | POA: Diagnosis not present

## 2018-03-02 DIAGNOSIS — E119 Type 2 diabetes mellitus without complications: Secondary | ICD-10-CM | POA: Diagnosis not present

## 2018-03-09 DIAGNOSIS — E119 Type 2 diabetes mellitus without complications: Secondary | ICD-10-CM | POA: Diagnosis not present

## 2018-03-10 DIAGNOSIS — I498 Other specified cardiac arrhythmias: Secondary | ICD-10-CM | POA: Diagnosis not present

## 2018-03-10 DIAGNOSIS — I739 Peripheral vascular disease, unspecified: Secondary | ICD-10-CM | POA: Diagnosis not present

## 2018-03-10 DIAGNOSIS — I10 Essential (primary) hypertension: Secondary | ICD-10-CM | POA: Diagnosis not present

## 2018-03-10 DIAGNOSIS — M25562 Pain in left knee: Secondary | ICD-10-CM | POA: Diagnosis not present

## 2018-03-10 DIAGNOSIS — Z7901 Long term (current) use of anticoagulants: Secondary | ICD-10-CM | POA: Diagnosis not present

## 2018-03-10 DIAGNOSIS — K219 Gastro-esophageal reflux disease without esophagitis: Secondary | ICD-10-CM | POA: Diagnosis not present

## 2018-03-10 DIAGNOSIS — E119 Type 2 diabetes mellitus without complications: Secondary | ICD-10-CM | POA: Diagnosis not present

## 2018-03-10 DIAGNOSIS — Z1339 Encounter for screening examination for other mental health and behavioral disorders: Secondary | ICD-10-CM | POA: Diagnosis not present

## 2018-03-10 DIAGNOSIS — M159 Polyosteoarthritis, unspecified: Secondary | ICD-10-CM | POA: Diagnosis not present

## 2018-03-10 DIAGNOSIS — N183 Chronic kidney disease, stage 3 (moderate): Secondary | ICD-10-CM | POA: Diagnosis not present

## 2018-03-20 ENCOUNTER — Telehealth: Payer: Self-pay | Admitting: Cardiology

## 2018-03-20 ENCOUNTER — Ambulatory Visit (INDEPENDENT_AMBULATORY_CARE_PROVIDER_SITE_OTHER): Payer: Medicare HMO | Admitting: *Deleted

## 2018-03-20 DIAGNOSIS — I5022 Chronic systolic (congestive) heart failure: Secondary | ICD-10-CM

## 2018-03-20 DIAGNOSIS — I428 Other cardiomyopathies: Secondary | ICD-10-CM | POA: Diagnosis not present

## 2018-03-20 NOTE — Telephone Encounter (Signed)
Spoke with pt and reminded pt of remote transmission that is due today. Pt verbalized understanding.   

## 2018-03-21 NOTE — Progress Notes (Signed)
Remote ICD transmission.   

## 2018-03-22 ENCOUNTER — Encounter: Payer: Self-pay | Admitting: Cardiology

## 2018-03-30 LAB — CUP PACEART REMOTE DEVICE CHECK
Battery Remaining Longevity: 103 mo
Battery Voltage: 3 V
Brady Statistic AP VP Percent: 0.09 %
Brady Statistic AP VS Percent: 88.66 %
Brady Statistic AS VP Percent: 0.01 %
Brady Statistic AS VS Percent: 11.23 %
Brady Statistic RA Percent Paced: 87.24 %
Brady Statistic RV Percent Paced: 0.11 %
Date Time Interrogation Session: 20190701182839
HighPow Impedance: 69 Ohm
Implantable Lead Implant Date: 20170922
Implantable Lead Implant Date: 20170922
Implantable Lead Location: 753859
Implantable Lead Location: 753860
Implantable Lead Model: 5076
Implantable Pulse Generator Implant Date: 20170922
Lead Channel Impedance Value: 361 Ohm
Lead Channel Impedance Value: 456 Ohm
Lead Channel Impedance Value: 475 Ohm
Lead Channel Pacing Threshold Amplitude: 0.625 V
Lead Channel Pacing Threshold Amplitude: 0.625 V
Lead Channel Pacing Threshold Pulse Width: 0.4 ms
Lead Channel Pacing Threshold Pulse Width: 0.4 ms
Lead Channel Sensing Intrinsic Amplitude: 2 mV
Lead Channel Sensing Intrinsic Amplitude: 2 mV
Lead Channel Sensing Intrinsic Amplitude: 9.375 mV
Lead Channel Sensing Intrinsic Amplitude: 9.375 mV
Lead Channel Setting Pacing Amplitude: 2 V
Lead Channel Setting Pacing Amplitude: 2.5 V
Lead Channel Setting Pacing Pulse Width: 0.4 ms
Lead Channel Setting Sensing Sensitivity: 0.3 mV

## 2018-04-03 ENCOUNTER — Ambulatory Visit (HOSPITAL_COMMUNITY)
Admission: RE | Admit: 2018-04-03 | Discharge: 2018-04-03 | Disposition: A | Discharge status: Home / Self Care | Payer: Medicare HMO | Source: Ambulatory Visit | Attending: Family | Admitting: Family

## 2018-04-03 ENCOUNTER — Encounter: Payer: Self-pay | Admitting: Family

## 2018-04-03 ENCOUNTER — Ambulatory Visit: Payer: Medicare HMO | Admitting: Family

## 2018-04-03 VITALS — BP 132/73 | HR 73 | Temp 97.3°F | Resp 18 | Ht 72.0 in | Wt 193.0 lb

## 2018-04-03 DIAGNOSIS — I779 Disorder of arteries and arterioles, unspecified: Secondary | ICD-10-CM

## 2018-04-03 DIAGNOSIS — I70201 Unspecified atherosclerosis of native arteries of extremities, right leg: Secondary | ICD-10-CM | POA: Insufficient documentation

## 2018-04-03 NOTE — Patient Instructions (Signed)

## 2018-04-03 NOTE — Progress Notes (Signed)
[facility] [place]   CC: Follow up peripheral artery occlusive disease  History of Present Illness Ryan [name] is a 72 y.o. male whom Dr. Trula Slade met in the hospital.  He was originally referred for possible right leg ischemia. 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 He also had infected hardware removed from his right foot in December 2013. He developed a wound on his right foot. He underwent angiography which revealed an occluded right popliteal artery. We had been managing his wound nonoperatively. He was visiting the wound center frequently and has since been discharged from their care. He is able to ambulate without difficulty although he does use a cane around the house occasionally.   He was exercising at a gym 2x/week, golfs once/month, he admits not walking as much lately.   After about 10 minutes of walking his right lower leg feels weak, relieved by rest. His right lower leg stays cooler than the left  He denies non healing wounds in feet or legs, the right toe ulcer has healed.  Pt denies any history of stroke or TIA.  He takes coumadin s/p mitral valve replacement. His PCP is Dr. Truman Hayward.   Serum creatinine was 1.35 on 01-20-17, 52 GFR.   Pt Diabetic: 6.0 A1C on 03/04/16, pt does not recall what most recent A1C was. Pt smoker: non-smoker  Pt meds include: Statin :Yes Betablocker: Yes ASA: No Other anticoagulants/antiplatelets: coumadin, history of atrial fib,s/p mitral valve replacement    Past Medical History:  Diagnosis Date  . Atrial fibrillation ([place])   . 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 Diabetes mellitus without complication ([place])   . Endocarditis   . Hypertension   . Peripheral vascular disease ([place])   . PVC's (premature ventricular contractions)   . SVT (supraventricular tachycardia)  long RP     Social History Social History   Tobacco Use  . Smoking  status: Never Smoker  . Smokeless tobacco: Never Used  Substance Use Topics  . Alcohol use: No    Alcohol/week: 0.0 oz  . Drug use: No    Family History Family History  Problem Relation Age of Onset  . Hypertension Mother   . Hypertension Father     Past Surgical History:  Procedure Laterality Date  . ABDOMINAL AORTAGRAM N/A 10/03/2012   Procedure: ABDOMINAL Maxcine Ham;  Surgeon: [name], MD;  Location: [facility] CATH LAB;  Service: Cardiovascular;  Laterality: N/A;  . CORONARY ANGIOGRAM  09/21/2012   Procedure: CORONARY ANGIOGRAM;  Surgeon: [name], MD;  Location: [facility] CATH LAB;  Service: Cardiovascular;;  . EP IMPLANTABLE DEVICE N/A 06/11/2016   Procedure: ICD Implant;  Surgeon: Will Meredith Leeds, MD;  Location: [place] CV LAB;  Service: Cardiovascular;  Laterality: N/A;  . EXTREMITY WIRE/PIN REMOVAL  09/14/2012   Procedure: REMOVAL K-WIRE/PIN EXTREMITY;  Surgeon: [name], MD;  Location: [place];  Service: Orthopedics;  Laterality: Right;  Right Foot  . I&D EXTREMITY  09/14/2012   Procedure: IRRIGATION AND DEBRIDEMENT EXTREMITY;  Surgeon: Tennis Must, MD;  Location: [place];  Service: Orthopedics;  Laterality: Right;  . INTRAOPERATIVE TRANSESOPHAGEAL ECHOCARDIOGRAM  09/26/2012   Procedure: INTRAOPERATIVE TRANSESOPHAGEAL ECHOCARDIOGRAM;  Surgeon: [name], MD;  Location: [facility] OR;  Service: Open Heart Surgery;  Laterality: N/A;  . MITRAL VALVE REPLACEMENT  09/26/2012   Procedure: MITRAL VALVE (MV) REPLACEMENT;  Surgeon: [name], MD;  Location: [place];  Service: Open Heart Surgery;  Laterality: N/A;  . RIGHT HEART CATHETERIZATION  09/21/2012   Procedure: RIGHT HEART CATH;  Surgeon: [name], MD;  Location[facility] CATH LAB;  Service: Cardiovascular;;  . TEE WITHOUT CARDIOVERSION  09/18/2012   Procedure: TRANSESOPHAGEAL ECHOCARDIOGRAM (TEE);  Surgeon: [name], MD;  Location: [facility] ENDOSCOPY;  Service: Cardiovascular;  Laterality: N/A;    Allergies  Allergen  Reactions  . Geralyn Flash [Fish Allergy] Nausea And Vomiting    Current Outpatient Medications  Medication Sig Dispense Refill  . amiodarone (PACERONE) 200 MG tablet Take 1 tablet (200 mg total) by mouth daily. Please keep upcoming appt for future refills. Thank you 30 tablet 5  . atorvastatin (LIPITOR) 40 MG tablet Take 1 tablet (40 mg total) by mouth daily at 6 PM. 30 tablet 5  . HYDROcodone-acetaminophen (NORCO/VICODIN) 5-325 MG per tablet Take 1 tablet by mouth 2 (two) times daily as needed for moderate pain.     . metoprolol succinate (TOPROL-XL) 25 MG 24 hr tablet Take 1 tablet (25 mg total) by mouth daily. Please keep upcoming appt with Dr. Tamala Julian in July for future refills. Thank you 30 tablet 1  . pantoprazole (PROTONIX) 40 MG tablet Take 40 mg by mouth daily.     . potassium chloride SA (K-DUR,KLOR-CON) 20 MEQ tablet Take 20 mEq by mouth daily.     . sildenafil (VIAGRA) 100 MG tablet Take 1 tablet (100 mg total) by mouth as needed for erectile dysfunction. 10 tablet 0  . triamcinolone cream (KENALOG) 0.5 % Apply 1 application topically as needed (SKIN).     . valsartan-hydrochlorothiazide (DIOVAN HCT) 80-12.5 MG tablet Take 1 tablet by mouth daily. 30 tablet 11  . warfarin (COUMADIN) 4 MG tablet Take 4 mg by mouth daily.     No current facility-administered medications for this visit.     ROS: See HPI for pertinent positives and negatives.   Physical Examination  Vitals:   04/03/18 1547  BP: 132/73  Pulse: 73  Resp: 18  Temp: (!) 97.3 F (36.3 C)  TempSrc: Oral  SpO2: 100%  Weight: 193 lb (87.5 kg)  Height: 6' (1.829 m)   Body mass index is 26.18 kg/m.  General: A&O x 3, WDWN, male. Gait: normal HENT: No gross abnormalities.  Eyes: PERRLA. Pulmonary: Respirations are non labored, CTAB, good air movement Cardiac: regular rhythm, no detected murmur. AICD palpated left upper chest.      Carotid Bruits Right Left   Negative Negative   Radial pulses are 2+ palpable  bilaterally   Adominal aortic pulse is not palpable                         VASCULAR EXAM: Extremities without ischemic changes, without Gangrene; without open wounds.                                                                                                          LE Pulses Right Left       FEMORAL  2+ palpable  2+ palpable        POPLITEAL  2+ palpable   1+ palpable       POSTERIOR TIBIAL  not palpable   not palpable        DORSALIS PEDIS      ANTERIOR TIBIAL not palpable  not palpable    Abdomen: soft, NT, no palpable masses. Skin: no rashes, no cellulitis, no ulcers noted. Musculoskeletal: no muscle wasting or atrophy.  Neurologic: A&O X 3; appropriate affect, Sensation is normal; MOTOR FUNCTION:  moving all extremities equally, motor strength 5/5 throughout. Speech is fluent/normal. CN 2-12 intact. Psychiatric: Thought content is normal, mood appropriate for clinical situation.     ASSESSMENT: Ryan [name] is a 72 y.o. 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 He remains active, but admits to not walking as much as he was.   Right toe ulcer has completely healed, no signs of ischemia in his feet or legs.  He takes a statin and coumadin. He has a hx of history of atrial fib ands/p mitral valve replacement  Fortunately he does not have DM and has never used tobacco.   DATA  ABI (Date: 04/03/2018):  R:   ABI: 0.72 (was [place] on 09-30-17),   PT: mono  DP: mono  TBI:  0.35, toe pressure 45 (was 0.48)  L:   ABI: [place] (was [place]),   PT: mono  DP: mono  TBI: 0.47, toe pressure 60 (was 0.58) Right ABI with moderate disease, left is unreliable due to non compressible vessels (medial calcification). All waveforms remain monophasic. Decline in bilateral TBI.     PLAN:  Graduated walking program discussed and how to  achieve. Based on the patient's vascular studies and examination, pt will return to clinic in6 monthswith ABI's. I advised him to notify us if he develops concerns re the circulation in his feet or legs.   I discussed in depth with the patient the nature of atherosclerosis, and emphasized the importance of maximal medical management including strict control of blood pressure, blood glucose, and lipid levels, obtaining regular exercise, and continued cessation of smoking.  The patient is aware that without maximal medical management the underlying atherosclerotic disease process will progress, limiting the benefit of any interventions.  The patient was given information about PAD including signs, symptoms, treatment, what symptoms should prompt the patient to seek immediate medical care, and risk reduction measures to take.  Clemon Chambers, RN, MSN, FNP-C [facility] [employer] Phone: [phone]  Clinic MD: Trula Slade  04/03/18 4:12 PM

## 2018-04-04 ENCOUNTER — Other Ambulatory Visit: Payer: Self-pay

## 2018-04-04 DIAGNOSIS — I779 Disorder of arteries and arterioles, unspecified: Secondary | ICD-10-CM

## 2018-04-04 DIAGNOSIS — I70201 Unspecified atherosclerosis of native arteries of extremities, right leg: Secondary | ICD-10-CM

## 2018-04-04 DIAGNOSIS — I739 Peripheral vascular disease, unspecified: Secondary | ICD-10-CM

## 2018-04-05 NOTE — Progress Notes (Signed)
Cardiology Office Note    Date:  04/06/2018   ID:  Ryan [name], DOB Jan 25, 1946, MRN [id number]  PCP:  Ryan Nakai, MD  Cardiologist: Ryan Grooms, MD   Chief Complaint  Patient presents with  . Atrial Fibrillation  . Irregular Heart Beat    PVCs    History of Present Illness:  Ryan [name] is a 72 y.o. male  with endocarditis, subsequent bioprosthetic AoV,PAF/SVT, PVC's at high burden currently being suppressed with amiodarone (Dr. 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  He denies syncope, dyspnea on exertion, chest pain, and peripheral edema.  He is as active as he can be with too bad knees.  The left knee is pretty severe and prevents significant physical activity.   Past Medical History:  Diagnosis Date  . Atrial fibrillation ([place])   . Cardiomyopathy, dilated ([place])   . Diabetes mellitus without complication ([place])   . Endocarditis   . Hypertension   . Peripheral vascular disease ([place])   . PVC's (premature ventricular contractions)   . SVT (supraventricular tachycardia)  long RP     Past Surgical History:  Procedure Laterality Date  . ABDOMINAL AORTAGRAM N/A 10/03/2012   Procedure: ABDOMINAL Maxcine Ham;  Surgeon: Ryan Mitchell, MD;  Location: [facility] CATH LAB;  Service: Cardiovascular;  Laterality: N/A;  . CORONARY ANGIOGRAM  09/21/2012   Procedure: CORONARY ANGIOGRAM;  Surgeon: Ryan Grooms, MD;  Location: [facility] CATH LAB;  Service: Cardiovascular;;  . EP IMPLANTABLE DEVICE N/A 06/11/2016   Procedure: ICD Implant;  Surgeon: Ryan Meredith Leeds, MD;  Location: [place] CV LAB;  Service: Cardiovascular;  Laterality: N/A;  . EXTREMITY WIRE/PIN REMOVAL  09/14/2012   Procedure: REMOVAL K-WIRE/PIN EXTREMITY;  Surgeon: Ryan Corning, MD;  Location: [place];  Service: Orthopedics;  Laterality: Right;  Right Foot  . I&D EXTREMITY  09/14/2012   Procedure: IRRIGATION AND  DEBRIDEMENT EXTREMITY;  Surgeon: Ryan Must, MD;  Location: [place];  Service: Orthopedics;  Laterality: Right;  . INTRAOPERATIVE TRANSESOPHAGEAL ECHOCARDIOGRAM  09/26/2012   Procedure: INTRAOPERATIVE TRANSESOPHAGEAL ECHOCARDIOGRAM;  Surgeon: Ryan Pollack, MD;  Location: [facility] OR;  Service: Open Heart Surgery;  Laterality: N/A;  . MITRAL VALVE REPLACEMENT  09/26/2012   Procedure: MITRAL VALVE (MV) REPLACEMENT;  Surgeon: Ryan Pollack, MD;  Location: [place] OR;  Service: Open Heart Surgery;  Laterality: N/A;  . RIGHT HEART CATHETERIZATION  09/21/2012   Procedure: RIGHT HEART CATH;  Surgeon: Ryan Grooms, MD;  Location: [facility] CATH LAB;  Service: Cardiovascular;;  . TEE WITHOUT CARDIOVERSION  09/18/2012   Procedure: TRANSESOPHAGEAL ECHOCARDIOGRAM (TEE);  Surgeon: Ryan Furbish, MD;  Location: [facility] ENDOSCOPY;  Service: Cardiovascular;  Laterality: N/A;    Current Medications: Outpatient Medications Prior to Visit  Medication Sig Dispense Refill  . amiodarone (PACERONE) 200 MG tablet Take 1 tablet (200 mg total) by mouth daily. Please keep upcoming appt for future refills. Thank you 30 tablet 5  . atorvastatin (LIPITOR) 40 MG tablet Take 1 tablet (40 mg total) by mouth daily at 6 PM. 30 tablet 5  . HYDROcodone-acetaminophen (NORCO/VICODIN) 5-325 MG per tablet Take 1 tablet by mouth 2 (two) times daily as needed for moderate pain.     . metoprolol succinate (TOPROL-XL) 25 MG 24 hr tablet Take 1 tablet (25 mg total) by mouth daily. Please keep upcoming appt with Dr. Tamala [name] in July for future refills. Thank you 30 tablet 1  .  pantoprazole (PROTONIX) 40 MG tablet Take 40 mg by mouth daily.     . potassium chloride SA (K-DUR,KLOR-CON) 20 MEQ tablet Take 20 mEq by mouth daily.     . sildenafil (VIAGRA) 100 MG tablet Take 1 tablet (100 mg total) by mouth as needed for erectile dysfunction. 10 tablet 0  . triamcinolone cream (KENALOG) 0.5 % Apply 1 application topically as needed (SKIN).     .  valsartan-hydrochlorothiazide (DIOVAN HCT) 80-12.5 MG tablet Take 1 tablet by mouth daily. 30 tablet 11  . warfarin (COUMADIN) 4 MG tablet Take 4 mg by mouth daily.     No facility-administered medications prior to visit.      Allergies:   Ryan [name] allergy]   Social History   Socioeconomic History  . Marital status: Legally Separated    Spouse name: Not on file  . Number of children: 3  . Years of education: Not on file  . Highest education level: Not on file  Occupational History  . Not on file  Social Needs  . Financial resource strain: Not on file  . Food insecurity:    Worry: Not on file    Inability: Not on file  . Transportation needs:    Medical: Not on file    Non-medical: Not on file  Tobacco Use  . Smoking status: Never Smoker  . Smokeless tobacco: Never Used  Substance and Sexual Activity  . Alcohol use: No    Alcohol/week: 0.0 oz  . Drug use: No  . Sexual activity: Not on file  Lifestyle  . Physical activity:    Days per week: Not on file    Minutes per session: Not on file  . Stress: Not on file  Relationships  . Social connections:    Talks on phone: Not on file    Gets together: Not on file    Attends religious service: Not on file    Active member of club or organization: Not on file    Attends meetings of clubs or organizations: Not on file    Relationship status: Not on file  Other Topics Concern  . Not on file  Social History Narrative   Daughter lives with him.  Retired Advertising account planner     Family History:  The patient's family history includes Hypertension in his father and mother.   ROS:   Please see the history of present illness.    Significant left knee discomfort the limits an active lifestyle. All other systems reviewed and are negative.   PHYSICAL EXAM:   VS:  BP 116/68   Pulse 88   Ht 6' (1.829 m)   Wt 194 lb 3.2 oz (88.1 kg)   BMI 26.34 kg/m    GEN: Well nourished, well developed, in no acute distress  HEENT: normal    Neck: no JVD, carotid bruits, or masses Cardiac: RRR; no murmurs, rubs, or gallops,no edema  Respiratory:  clear to auscultation bilaterally, normal work of breathing GI: soft, nontender, nondistended, + BS MS: no deformity or atrophy  Skin: warm and dry, no rash Neuro:  Alert and Oriented x 3, Strength and sensation are intact Psych: euthymic mood, full affect  Wt Readings from Last 3 Encounters:  04/06/18 194 lb 3.2 oz (88.1 kg)  04/03/18 193 lb (87.5 kg)  01/18/18 197 lb 3.2 oz (89.4 kg)      Studies/Labs Reviewed:   EKG:  EKG was not performed on today's visit.  Recent Labs: 01/18/2018: ALT 33; TSH 2.610  Lipid Panel    Component Value Date/Time   CHOL 121 09/16/2012 0520   TRIG 174 (H) 09/16/2012 0520   HDL 6 (L) 09/16/2012 0520   CHOLHDL 20.2 09/16/2012 0520   VLDL 35 09/16/2012 0520   LDLCALC 80 09/16/2012 0520    Additional studies/ records that were reviewed today include:  No new data.  Recent liver and TSH from May were reviewed.  2D Doppler echocardiogram performed August 2017: ------------------------------------------------------------------- Study Conclusions  - Left ventricle: The cavity size was normal. Systolic function was   moderately to severely reduced. The estimated ejection fraction   was in the range of 30% to 35%. Diffuse hypokinesis, worse in the   septum. - Aortic valve: Transvalvular velocity was within the normal range.   There was no stenosis. There was no regurgitation. - Mitral valve: A bioprosthesis was present. Pressure half-time: 63   ms. Mean gradient (D): 12 mm Hg. - Left atrium: The atrium was moderately dilated. - Right ventricle: The cavity size was normal. Wall thickness was   normal. Systolic function was moderately reduced. - Tricuspid valve: There was no regurgitation. - Inferior vena cava: The vessel was normal in size. The   respirophasic diameter changes were in the normal range (>= 50%),   consistent with normal  central venous pressure.  Impressions:  - Mean gradient across the mitral valve is significantly increased   compared with 12/03/15. 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 Also, the TVI   ratio is 3.4, consistent with pathologic obstruction. 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 Consider repeating echo once tachycardia has   resolved.    ASSESSMENT:    1. Paroxysmal atrial fibrillation (HCC)   2. Chronic systolic heart failure ([place])   3. On amiodarone therapy   4. PVC's (premature ventricular contractions)   5. Status post mitral valve replacement with bioprosthetic valve   6. ICD (implantable cardioverter-defibrillator) in place   7. Essential hypertension      PLAN:  In order of problems listed above:  1. No recurrence of atrial fibrillation.  This was an issue after mitral valve repair/replacement.  Currently on amiodarone for PVC suppression. 2. No evidence of volume overload. 3. Amiodarone 200 mg daily per Dr. [name].  TSH and hepatic panel being followed by EP. 4. Apparent nice suppression with amiodarone therapy. 5. Valve auscultation does not reveal any evidence of this function or regurgitation.   Clinical follow-up in 1 year.  No change in current medical regimen.  Call if shortness of breath, swelling, chest pain.  Medication Adjustments/Labs and Tests Ordered: Current medicines are reviewed at length with the patient today.  Concerns regarding medicines are outlined above.  Medication changes, Labs and Tests ordered today are listed in the Patient Instructions below. Patient Instructions  Medication Instructions:  Your physician recommends that you continue on your current medications as directed. Please refer to the Current Medication list given to you  today.  Labwork: None  Testing/Procedures: None  Follow-Up: Your physician wants you to follow-up in: 1 year with Dr. Tamala [name].  You Ryan receive a reminder letter in the mail two months in advance. If you don't receive a letter, please call our office to schedule the follow-up appointment.   Any Other Special Instructions Ryan Be Listed Below (If Applicable).  If you need a refill on your cardiac medications before your next appointment, please call your pharmacy.      Signed, Ryan Grooms, MD  04/06/2018 12:24 PM    [facility] [place], [place], [place]  [address] Phone: [phone]; Fax: [phone]

## 2018-04-06 ENCOUNTER — Encounter: Payer: Self-pay | Admitting: Interventional Cardiology

## 2018-04-06 ENCOUNTER — Ambulatory Visit: Payer: Medicare HMO | Admitting: Interventional Cardiology

## 2018-04-06 VITALS — BP 116/68 | HR 88 | Ht 72.0 in | Wt 194.2 lb

## 2018-04-06 DIAGNOSIS — Z79899 Other long term (current) drug therapy: Secondary | ICD-10-CM | POA: Diagnosis not present

## 2018-04-06 DIAGNOSIS — I5022 Chronic systolic (congestive) heart failure: Secondary | ICD-10-CM

## 2018-04-06 DIAGNOSIS — I10 Essential (primary) hypertension: Secondary | ICD-10-CM | POA: Diagnosis not present

## 2018-04-06 DIAGNOSIS — Z95810 Presence of automatic (implantable) cardiac defibrillator: Secondary | ICD-10-CM | POA: Diagnosis not present

## 2018-04-06 DIAGNOSIS — I493 Ventricular premature depolarization: Secondary | ICD-10-CM | POA: Diagnosis not present

## 2018-04-06 DIAGNOSIS — I480 Paroxysmal atrial fibrillation: Secondary | ICD-10-CM

## 2018-04-06 DIAGNOSIS — Z953 Presence of xenogenic heart valve: Secondary | ICD-10-CM | POA: Diagnosis not present

## 2018-04-06 NOTE — Patient Instructions (Signed)
Medication Instructions:  Your physician recommends that you continue on your current medications as directed. Please refer to the Current Medication list given to you today.   Labwork: None   Testing/Procedures: None   Follow-Up: Your physician wants you to follow-up in 1 year with Dr. Smith. You will receive a reminder letter in the mail two months in advance. If you don't receive a letter, please call our office to schedule the follow-up appointment.   Any Other Special Instructions Will Be Listed Below (If Applicable).     If you need a refill on your cardiac medications before your next appointment, please call your pharmacy.   

## 2018-04-10 DIAGNOSIS — M159 Polyosteoarthritis, unspecified: Secondary | ICD-10-CM | POA: Diagnosis not present

## 2018-04-10 DIAGNOSIS — N529 Male erectile dysfunction, unspecified: Secondary | ICD-10-CM | POA: Diagnosis not present

## 2018-04-10 DIAGNOSIS — I739 Peripheral vascular disease, unspecified: Secondary | ICD-10-CM | POA: Diagnosis not present

## 2018-04-10 DIAGNOSIS — J309 Allergic rhinitis, unspecified: Secondary | ICD-10-CM | POA: Diagnosis not present

## 2018-04-10 DIAGNOSIS — N183 Chronic kidney disease, stage 3 (moderate): Secondary | ICD-10-CM | POA: Diagnosis not present

## 2018-04-10 DIAGNOSIS — E876 Hypokalemia: Secondary | ICD-10-CM | POA: Diagnosis not present

## 2018-04-10 DIAGNOSIS — K219 Gastro-esophageal reflux disease without esophagitis: Secondary | ICD-10-CM | POA: Diagnosis not present

## 2018-04-10 DIAGNOSIS — I498 Other specified cardiac arrhythmias: Secondary | ICD-10-CM | POA: Diagnosis not present

## 2018-04-10 DIAGNOSIS — Z7901 Long term (current) use of anticoagulants: Secondary | ICD-10-CM | POA: Diagnosis not present

## 2018-05-11 DIAGNOSIS — M159 Polyosteoarthritis, unspecified: Secondary | ICD-10-CM | POA: Diagnosis not present

## 2018-05-11 DIAGNOSIS — J309 Allergic rhinitis, unspecified: Secondary | ICD-10-CM | POA: Diagnosis not present

## 2018-05-11 DIAGNOSIS — R791 Abnormal coagulation profile: Secondary | ICD-10-CM | POA: Diagnosis not present

## 2018-05-11 DIAGNOSIS — K219 Gastro-esophageal reflux disease without esophagitis: Secondary | ICD-10-CM | POA: Diagnosis not present

## 2018-05-11 DIAGNOSIS — N183 Chronic kidney disease, stage 3 (moderate): Secondary | ICD-10-CM | POA: Diagnosis not present

## 2018-05-11 DIAGNOSIS — N529 Male erectile dysfunction, unspecified: Secondary | ICD-10-CM | POA: Diagnosis not present

## 2018-05-11 DIAGNOSIS — I498 Other specified cardiac arrhythmias: Secondary | ICD-10-CM | POA: Diagnosis not present

## 2018-05-11 DIAGNOSIS — I739 Peripheral vascular disease, unspecified: Secondary | ICD-10-CM | POA: Diagnosis not present

## 2018-05-11 DIAGNOSIS — E876 Hypokalemia: Secondary | ICD-10-CM | POA: Diagnosis not present

## 2018-05-17 DIAGNOSIS — J309 Allergic rhinitis, unspecified: Secondary | ICD-10-CM | POA: Diagnosis not present

## 2018-05-17 DIAGNOSIS — N183 Chronic kidney disease, stage 3 (moderate): Secondary | ICD-10-CM | POA: Diagnosis not present

## 2018-05-17 DIAGNOSIS — R791 Abnormal coagulation profile: Secondary | ICD-10-CM | POA: Diagnosis not present

## 2018-05-17 DIAGNOSIS — E876 Hypokalemia: Secondary | ICD-10-CM | POA: Diagnosis not present

## 2018-05-17 DIAGNOSIS — I498 Other specified cardiac arrhythmias: Secondary | ICD-10-CM | POA: Diagnosis not present

## 2018-05-17 DIAGNOSIS — K219 Gastro-esophageal reflux disease without esophagitis: Secondary | ICD-10-CM | POA: Diagnosis not present

## 2018-05-17 DIAGNOSIS — I739 Peripheral vascular disease, unspecified: Secondary | ICD-10-CM | POA: Diagnosis not present

## 2018-05-17 DIAGNOSIS — N529 Male erectile dysfunction, unspecified: Secondary | ICD-10-CM | POA: Diagnosis not present

## 2018-05-17 DIAGNOSIS — M159 Polyosteoarthritis, unspecified: Secondary | ICD-10-CM | POA: Diagnosis not present

## 2018-06-01 DIAGNOSIS — K219 Gastro-esophageal reflux disease without esophagitis: Secondary | ICD-10-CM | POA: Diagnosis not present

## 2018-06-01 DIAGNOSIS — R791 Abnormal coagulation profile: Secondary | ICD-10-CM | POA: Diagnosis not present

## 2018-06-01 DIAGNOSIS — M159 Polyosteoarthritis, unspecified: Secondary | ICD-10-CM | POA: Diagnosis not present

## 2018-06-01 DIAGNOSIS — N183 Chronic kidney disease, stage 3 (moderate): Secondary | ICD-10-CM | POA: Diagnosis not present

## 2018-06-01 DIAGNOSIS — E876 Hypokalemia: Secondary | ICD-10-CM | POA: Diagnosis not present

## 2018-06-01 DIAGNOSIS — I498 Other specified cardiac arrhythmias: Secondary | ICD-10-CM | POA: Diagnosis not present

## 2018-06-01 DIAGNOSIS — I739 Peripheral vascular disease, unspecified: Secondary | ICD-10-CM | POA: Diagnosis not present

## 2018-06-01 DIAGNOSIS — M25562 Pain in left knee: Secondary | ICD-10-CM | POA: Diagnosis not present

## 2018-06-01 DIAGNOSIS — N529 Male erectile dysfunction, unspecified: Secondary | ICD-10-CM | POA: Diagnosis not present

## 2018-06-08 DIAGNOSIS — N183 Chronic kidney disease, stage 3 (moderate): Secondary | ICD-10-CM | POA: Diagnosis not present

## 2018-06-08 DIAGNOSIS — E876 Hypokalemia: Secondary | ICD-10-CM | POA: Diagnosis not present

## 2018-06-08 DIAGNOSIS — M159 Polyosteoarthritis, unspecified: Secondary | ICD-10-CM | POA: Diagnosis not present

## 2018-06-08 DIAGNOSIS — E7800 Pure hypercholesterolemia, unspecified: Secondary | ICD-10-CM | POA: Diagnosis not present

## 2018-06-08 DIAGNOSIS — N529 Male erectile dysfunction, unspecified: Secondary | ICD-10-CM | POA: Diagnosis not present

## 2018-06-08 DIAGNOSIS — I10 Essential (primary) hypertension: Secondary | ICD-10-CM | POA: Diagnosis not present

## 2018-06-08 DIAGNOSIS — I739 Peripheral vascular disease, unspecified: Secondary | ICD-10-CM | POA: Diagnosis not present

## 2018-06-08 DIAGNOSIS — I498 Other specified cardiac arrhythmias: Secondary | ICD-10-CM | POA: Diagnosis not present

## 2018-06-08 DIAGNOSIS — M25562 Pain in left knee: Secondary | ICD-10-CM | POA: Diagnosis not present

## 2018-06-08 DIAGNOSIS — R791 Abnormal coagulation profile: Secondary | ICD-10-CM | POA: Diagnosis not present

## 2018-06-08 DIAGNOSIS — K219 Gastro-esophageal reflux disease without esophagitis: Secondary | ICD-10-CM | POA: Diagnosis not present

## 2018-06-08 DIAGNOSIS — E119 Type 2 diabetes mellitus without complications: Secondary | ICD-10-CM | POA: Diagnosis not present

## 2018-06-14 DIAGNOSIS — M159 Polyosteoarthritis, unspecified: Secondary | ICD-10-CM | POA: Diagnosis not present

## 2018-06-14 DIAGNOSIS — N183 Chronic kidney disease, stage 3 (moderate): Secondary | ICD-10-CM | POA: Diagnosis not present

## 2018-06-14 DIAGNOSIS — I498 Other specified cardiac arrhythmias: Secondary | ICD-10-CM | POA: Diagnosis not present

## 2018-06-14 DIAGNOSIS — N529 Male erectile dysfunction, unspecified: Secondary | ICD-10-CM | POA: Diagnosis not present

## 2018-06-14 DIAGNOSIS — Z23 Encounter for immunization: Secondary | ICD-10-CM | POA: Diagnosis not present

## 2018-06-14 DIAGNOSIS — K219 Gastro-esophageal reflux disease without esophagitis: Secondary | ICD-10-CM | POA: Diagnosis not present

## 2018-06-14 DIAGNOSIS — R791 Abnormal coagulation profile: Secondary | ICD-10-CM | POA: Diagnosis not present

## 2018-06-14 DIAGNOSIS — E876 Hypokalemia: Secondary | ICD-10-CM | POA: Diagnosis not present

## 2018-06-14 DIAGNOSIS — I739 Peripheral vascular disease, unspecified: Secondary | ICD-10-CM | POA: Diagnosis not present

## 2018-06-19 ENCOUNTER — Ambulatory Visit (INDEPENDENT_AMBULATORY_CARE_PROVIDER_SITE_OTHER): Payer: Medicare HMO | Admitting: *Deleted

## 2018-06-19 DIAGNOSIS — I428 Other cardiomyopathies: Secondary | ICD-10-CM

## 2018-06-19 DIAGNOSIS — I5022 Chronic systolic (congestive) heart failure: Secondary | ICD-10-CM | POA: Diagnosis not present

## 2018-06-20 NOTE — Progress Notes (Signed)
Remote ICD transmission.   

## 2018-06-23 LAB — CUP PACEART REMOTE DEVICE CHECK
Battery Remaining Longevity: 99 mo
Battery Voltage: 3.01 V
Brady Statistic AP VP Percent: 0.08 %
Brady Statistic AP VS Percent: 82.83 %
Brady Statistic AS VP Percent: 0.02 %
Brady Statistic AS VS Percent: 17.07 %
Brady Statistic RA Percent Paced: 82.55 %
Brady Statistic RV Percent Paced: 0.1 %
Date Time Interrogation Session: 20190930063323
HighPow Impedance: 68 Ohm
Implantable Lead Implant Date: 20170922
Implantable Lead Implant Date: 20170922
Implantable Lead Location: 753859
Implantable Lead Location: 753860
Implantable Lead Model: 5076
Implantable Pulse Generator Implant Date: 20170922
Lead Channel Impedance Value: 361 Ohm
Lead Channel Impedance Value: 399 Ohm
Lead Channel Impedance Value: 418 Ohm
Lead Channel Pacing Threshold Amplitude: 0.625 V
Lead Channel Pacing Threshold Amplitude: 0.625 V
Lead Channel Pacing Threshold Pulse Width: 0.4 ms
Lead Channel Pacing Threshold Pulse Width: 0.4 ms
Lead Channel Sensing Intrinsic Amplitude: 3 mV
Lead Channel Sensing Intrinsic Amplitude: 3 mV
Lead Channel Sensing Intrinsic Amplitude: 9.875 mV
Lead Channel Sensing Intrinsic Amplitude: 9.875 mV
Lead Channel Setting Pacing Amplitude: 2 V
Lead Channel Setting Pacing Amplitude: 2.5 V
Lead Channel Setting Pacing Pulse Width: 0.4 ms
Lead Channel Setting Sensing Sensitivity: 0.3 mV

## 2018-06-28 ENCOUNTER — Other Ambulatory Visit: Payer: Self-pay | Admitting: Interventional Cardiology

## 2018-06-28 MED ORDER — AMIODARONE HCL 200 MG PO TABS: 200.0000 mg | ORAL_TABLET | Freq: Every day | ORAL | 9 refills | Status: DC

## 2018-06-28 NOTE — Telephone Encounter (Signed)
Pt's medication was sent to pt's pharmacy as requested. Confirmation received.  °

## 2018-06-28 NOTE — Telephone Encounter (Signed)
 *  STAT* If patient is at the pharmacy, call can be transferred to refill team.   1. Which medications need to be refilled? (please list name of each medication and dose if known) amiodarone (PACERONE) 200 MG tablet  2. Which pharmacy/location (including street and city if local pharmacy) is medication to be sent to? [facility]  3. Do they need a 30 day or 90 day supply? [place]

## 2018-07-10 ENCOUNTER — Other Ambulatory Visit: Payer: Self-pay | Admitting: Interventional Cardiology

## 2018-07-17 DIAGNOSIS — I493 Ventricular premature depolarization: Secondary | ICD-10-CM | POA: Diagnosis not present

## 2018-07-17 DIAGNOSIS — N183 Chronic kidney disease, stage 3 (moderate): Secondary | ICD-10-CM | POA: Diagnosis not present

## 2018-07-17 DIAGNOSIS — N401 Enlarged prostate with lower urinary tract symptoms: Secondary | ICD-10-CM | POA: Diagnosis not present

## 2018-07-17 DIAGNOSIS — I739 Peripheral vascular disease, unspecified: Secondary | ICD-10-CM | POA: Diagnosis not present

## 2018-07-17 DIAGNOSIS — M159 Polyosteoarthritis, unspecified: Secondary | ICD-10-CM | POA: Diagnosis not present

## 2018-07-17 DIAGNOSIS — N529 Male erectile dysfunction, unspecified: Secondary | ICD-10-CM | POA: Diagnosis not present

## 2018-07-17 DIAGNOSIS — K219 Gastro-esophageal reflux disease without esophagitis: Secondary | ICD-10-CM | POA: Diagnosis not present

## 2018-07-17 DIAGNOSIS — E876 Hypokalemia: Secondary | ICD-10-CM | POA: Diagnosis not present

## 2018-07-17 DIAGNOSIS — Z7901 Long term (current) use of anticoagulants: Secondary | ICD-10-CM | POA: Diagnosis not present

## 2018-07-18 ENCOUNTER — Ambulatory Visit: Payer: Medicare HMO | Admitting: Cardiology

## 2018-07-18 ENCOUNTER — Encounter: Payer: Self-pay | Admitting: Cardiology

## 2018-07-18 VITALS — BP 102/54 | HR 77 | Ht 72.0 in | Wt 190.6 lb

## 2018-07-18 DIAGNOSIS — I493 Ventricular premature depolarization: Secondary | ICD-10-CM

## 2018-07-18 DIAGNOSIS — I480 Paroxysmal atrial fibrillation: Secondary | ICD-10-CM | POA: Diagnosis not present

## 2018-07-18 DIAGNOSIS — I428 Other cardiomyopathies: Secondary | ICD-10-CM

## 2018-07-18 DIAGNOSIS — R001 Bradycardia, unspecified: Secondary | ICD-10-CM | POA: Diagnosis not present

## 2018-07-18 DIAGNOSIS — I5022 Chronic systolic (congestive) heart failure: Secondary | ICD-10-CM

## 2018-07-18 MED ORDER — MEXILETINE HCL 250 MG PO CAPS: 250.0000 mg | ORAL_CAPSULE | Freq: Two times a day (BID) | ORAL | 6 refills | Status: DC

## 2018-07-18 MED ORDER — VALSARTAN 80 MG PO TABS: 80.0000 mg | ORAL_TABLET | Freq: Every day | ORAL | 6 refills | Status: DC

## 2018-07-18 NOTE — Progress Notes (Signed)
Electrophysiology Office Note   Date:  07/18/2018   ID:  Ryan [name], Ryan [name] June 18, 1946, MRN [id number]  PCP:  [name], MD  Cardiologist:  Pernell Dupre Primary Electrophysiologist:  Will Meredith Leeds, MD    No chief complaint on file.    History of Present Illness: Ryan [name] is a 72 y.o. male who presents today for electrophysiology evaluation.   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   He has been having a high volume of PVCs. His most recent monitor showed that his PVC burden was up to 20%. He had an echocardiogram showed an EF of 30-35%, which could be coming from his PVCs. He was put on [name] see if we can decrease his PVCs which may help his low EF. He was in accelerated junctional rhythm when getting his most recent TTE.  Today, denies symptoms of palpitations, chest pain, shortness of breath, orthopnea, PND, lower extremity edema, claudication, dizziness, presyncope, syncope, bleeding, or neurologic sequela. The patient is tolerating medications without difficulties.  Overall he is feeling well.  He has no chest pain or shortness of breath.  He is able to do most of his daily activities.  There are times that he feels weak and fatigued.  He attributes this to having low blood pressures.  He thinks that it could be related to his medications.   Past Medical History:  Diagnosis Date  . Atrial fibrillation ([place])   . Cardiomyopathy, dilated ([place])   . Diabetes mellitus without complication ([place])   . Endocarditis   . Hypertension   . Peripheral vascular disease ([place])   . PVC's (premature ventricular contractions)   . SVT (supraventricular tachycardia)  long RP    Past Surgical History:  Procedure Laterality Date  . ABDOMINAL AORTAGRAM N/A 10/03/2012   Procedure: ABDOMINAL Maxcine Ham;  Surgeon: [name], MD;  Location: [facility] CATH LAB;  Service:  Cardiovascular;  Laterality: N/A;  . CORONARY ANGIOGRAM  09/21/2012   Procedure: CORONARY ANGIOGRAM;  Surgeon: [name], MD;  Location: [facility] CATH LAB;  Service: Cardiovascular;;  . EP IMPLANTABLE DEVICE N/A 06/11/2016   Procedure: ICD Implant;  Surgeon: Will Meredith Leeds, MD;  Location: [place] CV LAB;  Service: Cardiovascular;  Laterality: N/A;  . EXTREMITY WIRE/PIN REMOVAL  09/14/2012   Procedure: REMOVAL K-WIRE/PIN EXTREMITY;  Surgeon: [name], MD;  Location: [place];  Service: Orthopedics;  Laterality: Right;  Right Foot  . I&D EXTREMITY  09/14/2012   Procedure: IRRIGATION AND DEBRIDEMENT EXTREMITY;  Surgeon: Tennis Must, MD;  Location: [place];  Service: Orthopedics;  Laterality: Right;  . INTRAOPERATIVE TRANSESOPHAGEAL ECHOCARDIOGRAM  09/26/2012   Procedure: INTRAOPERATIVE TRANSESOPHAGEAL ECHOCARDIOGRAM;  Surgeon: [name], MD;  Location: [facility] OR;  Service: Open Heart Surgery;  Laterality: N/A;  . MITRAL VALVE REPLACEMENT  09/26/2012   Procedure: MITRAL VALVE (MV) REPLACEMENT;  Surgeon: [name], MD;  Location: [place] OR;  Service: Open Heart Surgery;  Laterality: N/A;  . RIGHT HEART CATHETERIZATION  09/21/2012   Procedure: RIGHT HEART CATH;  Surgeon: [name], MD;  Location: [facility] CATH LAB;  Service: Cardiovascular;;  . TEE WITHOUT CARDIOVERSION  09/18/2012   Procedure: TRANSESOPHAGEAL ECHOCARDIOGRAM (TEE);  Surgeon: [name], MD;  Location: [facility] ENDOSCOPY;  Service: Cardiovascular;  Laterality: N/A;     Current Outpatient Medications  Medication Sig Dispense Refill  . atorvastatin (LIPITOR) 40 MG  tablet Take 1 tablet (40 mg total) by mouth daily at 6 PM. 30 tablet 5  . HYDROcodone-acetaminophen (NORCO/VICODIN) 5-325 MG per tablet Take 1 tablet by mouth 2 (two) times daily as needed for moderate pain.     Marland Kitchen losartan-hydrochlorothiazide (HYZAAR) 100-25 MG tablet Take 1 tablet by mouth daily.  12  . metoprolol succinate (TOPROL-XL) 25 MG 24 hr tablet TAKE 1 TABLET  DAILY 30 tablet 9  . pantoprazole (PROTONIX) 40 MG tablet Take 40 mg by mouth daily.     . potassium chloride SA (K-DUR,KLOR-CON) 20 MEQ tablet Take 20 mEq by mouth daily.     . sildenafil (VIAGRA) 100 MG tablet Take 1 tablet (100 mg total) by mouth as needed for erectile dysfunction. 10 tablet 0  . tamsulosin (FLOMAX) 0.4 MG CAPS capsule Take 1 capsule by mouth daily.    Marland Kitchen triamcinolone cream (KENALOG) 0.5 % Apply 1 application topically as needed (SKIN).     Marland Kitchen warfarin (COUMADIN) 4 MG tablet Take 4 mg by mouth daily.    Marland Kitchen mexiletine (MEXITIL) 250 MG capsule Take 1 capsule (250 mg total) by mouth 2 (two) times daily. 60 capsule 6  . valsartan (DIOVAN) 80 MG tablet Take 1 tablet (80 mg total) by mouth daily. 30 tablet 6   No current facility-administered medications for this visit.     Allergies:   [name] allergy]   Social History:  The patient  reports that he has never smoked. He has never used smokeless tobacco. He reports that he does not drink alcohol or use drugs.   Family History:  The patient's family history includes Hypertension in his father and mother.    ROS:  Please see the history of present illness.   Otherwise, review of systems is positive for none.   All other systems are reviewed and negative.   PHYSICAL EXAM: VS:  BP (!) 102/54   Pulse 77   Ht 6' (1.829 m)   Wt 190 lb 9.6 oz (86.5 kg)   SpO2 98%   BMI 25.85 kg/m  , BMI Body mass index is 25.85 kg/m. GEN: Well nourished, well developed, in no acute distress  HEENT: normal  Neck: no JVD, carotid bruits, or masses Cardiac: RRR; no murmurs, rubs, or gallops,no edema  Respiratory:  clear to auscultation bilaterally, normal work of breathing GI: soft, nontender, nondistended, + BS MS: no deformity or atrophy  Skin: warm and dry, device site well healed Neuro:  Strength and sensation are intact Psych: euthymic mood, full affect  EKG:  EKG is ordered today. Personal review of the ekg ordered shows atrial  paced, first-degree AV block  Personal review of the device interrogation today. Results in [place]: 01/18/2018: ALT 33; TSH 2.610    Lipid Panel     Component Value Date/Time   CHOL 121 09/16/2012 0520   TRIG 174 (H) 09/16/2012 0520   HDL 6 (L) 09/16/2012 0520   CHOLHDL 20.2 09/16/2012 0520   VLDL 35 09/16/2012 0520   LDLCALC 80 09/16/2012 0520     Wt Readings from Last 3 Encounters:  07/18/18 190 lb 9.6 oz (86.5 kg)  04/06/18 194 lb 3.2 oz (88.1 kg)  04/03/18 193 lb (87.5 kg)      Other studies Reviewed: Additional studies/ records that were reviewed today include: 48 hour monitor  Review of the above records today demonstrates:  Minimum: 59 bpm at 9:31 PM Mean: 88 bpm mV Maximum: 134 bpm at 9:44 AM  Ventricular ectopy 22.08% of beats Supraventricular events 0.92% of beats Short runs of SVT  TTE 04/26/16 - Left ventricle: The cavity size was normal. Systolic function was   moderately to severely reduced. The estimated ejection fraction   was in the range of 30% to 35%. Diffuse hypokinesis, worse in the   septum. - Aortic valve: Transvalvular velocity was within the normal range.   There was no stenosis. There was no regurgitation. - Mitral valve: A bioprosthesis was present. Pressure half-time: 63   ms. Mean gradient (D): 12 mm Hg. - Left atrium: The atrium was moderately dilated. - Right ventricle: The cavity size was normal. Wall thickness was   normal. Systolic function was moderately reduced. - Tricuspid valve: There was no regurgitation. - Inferior vena cava: The vessel was normal in size. The   respirophasic diameter changes were in the normal range (>= 50%),   consistent with normal central venous pressure.  Impressions:  - Mean gradient across the mitral valve is significantly increased   compared with 12/03/15. 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 Also, the TVI   ratio is 3.4,  consistent with pathologic obstruction. 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   ASSESSMENT AND PLAN:  1. Junctional Rhythm: Medtronic dual-chamber ICD implanted 06/11/2016.  Device functioning appropriately.  No changes.  2. PAF -not on device interrogation.  Currently on Coumadin.  This patients CHA2DS2-VASc Score and unadjusted Ischemic Stroke Rate (% per year) is equal to 3.2 % stroke rate/year from a score of 3  Above score calculated as 1 point each if present [CHF, HTN, DM, Vascular=MI/PAD/Aortic Plaque, Age if 65-74, or Male] Above score calculated as 2 points each if present [Age > 75, or Stroke/TIA/TE]  3. PVC: Currently on amiodarone.  Burden greatly reduced.  Due to being on amiodarone and long-term side effects and his young age, would prefer to have him off this medication.  We will stop amiodarone and start mexiletine.  4. S/p MVR -stable on most recent echo  5.  Chronic systolic heart failure due to nonischemic cardiomyopathy: Status post Medtronic ICD.  On optimal medical therapy.  He is having some weakness and fatigue today and his blood pressure is low.  We will stop his hydrochlorothiazide and I have told him to take his Toprol-XL closer to bedtime which may help with his symptoms.  Current asked .medicines are reviewed at length with the patient today.   The patient does not have concerns regarding his medicines.  The following changes were made today: Stop HCTZ, stop amiodarone, start mexiletine  Labs/ tests ordered today include:  Orders Placed This Encounter  Procedures  . EKG 12-Lead     Disposition:   FU with Will Camnitz 6 months  Signed, Will Meredith Leeds, MD  07/18/2018 2:46 PM     [address] [place] [address] [phone] (office) [phone] (fax)

## 2018-07-18 NOTE — Patient Instructions (Addendum)
Medication Instructions:  Your physician has recommended you make the following change in your medication:  1. STOP Amiodarone 2. START Mexiletine 250 mg twice daily 3. Take your Toprol closer to or at bedtime 4. STOP Valsartan-Hydrochlorothiazide 5. START Valsartan 80 mg once daily  If you need a refill on your cardiac medications before your next appointment, please call your pharmacy.   Lab work: None ordered  Testing/Procedures: None ordered  Follow-Up: Remote monitoring is used to monitor your ICD from home. This monitoring reduces the number of office visits required to check your device to one time per year. It allows [place] to keep an eye on the functioning of your device to ensure it is working properly. You are scheduled for a device check from home on 09/18/2018. You may send your transmission at any time that day. If you have a wireless device, the transmission will be sent automatically. After your physician reviews your transmission, you will receive a postcard with your next transmission date.  At [facility], you and your health needs are our priority.  As part of our continuing mission to provide you with exceptional heart care, we have created designated Provider Care Teams.  These Care Teams include your primary Cardiologist (physician) and Advanced Practice Providers (APPs -  Physician Assistants and Nurse Practitioners) who all work together to provide you with the care you need, when you need it. You will need a follow up appointment in 6 months in [place].  Please call our office 2 months in advance to schedule this appointment.  You may see Will Meredith Leeds, MD or one of the following Advanced Practice Providers on your designated Care Team:   Chanetta Marshall, NP . Tommye Standard, PA-C  Thank you for choosing [facility]!!   Trinidad Curet, RN [phone]

## 2018-07-19 ENCOUNTER — Telehealth: Payer: Self-pay | Admitting: Cardiology

## 2018-07-19 NOTE — Telephone Encounter (Signed)
  Pt c/o medication issue:  1. Name of Medication: mexiletine (MEXITIL) 250 MG capsule  2. How are you currently taking this medication (dosage and times per day)? Hasn't started yet  3. Are you having a reaction (difficulty breathing--STAT)?  No  4. What is your medication issue? Patient went to pick up script but the medicine was too expensive and he cannot afford it. Pt wants to know if there is something else that he can take.

## 2018-07-19 NOTE — Telephone Encounter (Signed)
Will forward to our PA dept to see if there are options for patient.

## 2018-07-20 NOTE — Telephone Encounter (Signed)
Called [employer] at [phone] regarding coverage for MEXITIL, they do state it is not covered until less expensive alternatives are tried. I did answer for her all clinical questions including patient had been on amiodarone previously. They state, [employer], will make a decision and notify us in a few days.  I did mail coupons to patient.  [employer] ref #[id number]

## 2018-07-20 NOTE — Telephone Encounter (Signed)
I called patient on phone and discussed medication assistance with MEXITIL. He said his insurance doesn't cover medication, I will work on [place]. I told him I printed him several coupons to assist with price and will mail those to him. Lowest price per month from [employer] around $50.00.

## 2018-07-24 NOTE — Telephone Encounter (Signed)
Letter received via fax from [facility] stating that they have denied the pts Mexiletine PA. Reason: Mexiletine is non formulary and is not on [employer]'s list of preferred drugs.  I have done an urgent appeal on this decision.

## 2018-07-25 NOTE — Telephone Encounter (Signed)
Letter received via fax from [facility] stating that they have approved the pts Mexiletine PA. Approval good from 07/25/2018 until 09/20/2019.  I have notified the pts pharmacy.

## 2018-07-26 DIAGNOSIS — I739 Peripheral vascular disease, unspecified: Secondary | ICD-10-CM | POA: Diagnosis not present

## 2018-07-26 DIAGNOSIS — N183 Chronic kidney disease, stage 3 (moderate): Secondary | ICD-10-CM | POA: Diagnosis not present

## 2018-07-26 DIAGNOSIS — M25562 Pain in left knee: Secondary | ICD-10-CM | POA: Diagnosis not present

## 2018-07-26 DIAGNOSIS — E876 Hypokalemia: Secondary | ICD-10-CM | POA: Diagnosis not present

## 2018-07-26 DIAGNOSIS — I498 Other specified cardiac arrhythmias: Secondary | ICD-10-CM | POA: Diagnosis not present

## 2018-07-26 DIAGNOSIS — N401 Enlarged prostate with lower urinary tract symptoms: Secondary | ICD-10-CM | POA: Diagnosis not present

## 2018-07-26 DIAGNOSIS — M159 Polyosteoarthritis, unspecified: Secondary | ICD-10-CM | POA: Diagnosis not present

## 2018-07-26 DIAGNOSIS — K219 Gastro-esophageal reflux disease without esophagitis: Secondary | ICD-10-CM | POA: Diagnosis not present

## 2018-07-26 DIAGNOSIS — N529 Male erectile dysfunction, unspecified: Secondary | ICD-10-CM | POA: Diagnosis not present

## 2018-08-01 DIAGNOSIS — M159 Polyosteoarthritis, unspecified: Secondary | ICD-10-CM | POA: Diagnosis not present

## 2018-08-01 DIAGNOSIS — E876 Hypokalemia: Secondary | ICD-10-CM | POA: Diagnosis not present

## 2018-08-01 DIAGNOSIS — N529 Male erectile dysfunction, unspecified: Secondary | ICD-10-CM | POA: Diagnosis not present

## 2018-08-01 DIAGNOSIS — I739 Peripheral vascular disease, unspecified: Secondary | ICD-10-CM | POA: Diagnosis not present

## 2018-08-01 DIAGNOSIS — N183 Chronic kidney disease, stage 3 (moderate): Secondary | ICD-10-CM | POA: Diagnosis not present

## 2018-08-01 DIAGNOSIS — M25562 Pain in left knee: Secondary | ICD-10-CM | POA: Diagnosis not present

## 2018-08-01 DIAGNOSIS — K219 Gastro-esophageal reflux disease without esophagitis: Secondary | ICD-10-CM | POA: Diagnosis not present

## 2018-08-01 DIAGNOSIS — N401 Enlarged prostate with lower urinary tract symptoms: Secondary | ICD-10-CM | POA: Diagnosis not present

## 2018-08-01 DIAGNOSIS — I498 Other specified cardiac arrhythmias: Secondary | ICD-10-CM | POA: Diagnosis not present

## 2018-08-31 DIAGNOSIS — K219 Gastro-esophageal reflux disease without esophagitis: Secondary | ICD-10-CM | POA: Diagnosis not present

## 2018-08-31 DIAGNOSIS — E876 Hypokalemia: Secondary | ICD-10-CM | POA: Diagnosis not present

## 2018-08-31 DIAGNOSIS — M25512 Pain in left shoulder: Secondary | ICD-10-CM | POA: Diagnosis not present

## 2018-08-31 DIAGNOSIS — N183 Chronic kidney disease, stage 3 (moderate): Secondary | ICD-10-CM | POA: Diagnosis not present

## 2018-08-31 DIAGNOSIS — I739 Peripheral vascular disease, unspecified: Secondary | ICD-10-CM | POA: Diagnosis not present

## 2018-08-31 DIAGNOSIS — M159 Polyosteoarthritis, unspecified: Secondary | ICD-10-CM | POA: Diagnosis not present

## 2018-08-31 DIAGNOSIS — J309 Allergic rhinitis, unspecified: Secondary | ICD-10-CM | POA: Diagnosis not present

## 2018-08-31 DIAGNOSIS — N529 Male erectile dysfunction, unspecified: Secondary | ICD-10-CM | POA: Diagnosis not present

## 2018-08-31 DIAGNOSIS — I498 Other specified cardiac arrhythmias: Secondary | ICD-10-CM | POA: Diagnosis not present

## 2018-09-09 LAB — CUP PACEART INCLINIC DEVICE CHECK
Date Time Interrogation Session: 20191221185602
Implantable Lead Implant Date: 20170922
Implantable Lead Implant Date: 20170922
Implantable Lead Location: 753859
Implantable Lead Location: 753860
Implantable Lead Model: 5076
Implantable Pulse Generator Implant Date: 20170922
Lead Channel Setting Pacing Amplitude: 2 V
Lead Channel Setting Pacing Amplitude: 2.5 V
Lead Channel Setting Pacing Pulse Width: 0.4 ms
Lead Channel Setting Sensing Sensitivity: 0.3 mV

## 2018-09-18 ENCOUNTER — Ambulatory Visit (INDEPENDENT_AMBULATORY_CARE_PROVIDER_SITE_OTHER): Payer: Medicare HMO

## 2018-09-18 DIAGNOSIS — I428 Other cardiomyopathies: Secondary | ICD-10-CM

## 2018-09-18 DIAGNOSIS — I5022 Chronic systolic (congestive) heart failure: Secondary | ICD-10-CM

## 2018-09-18 NOTE — Progress Notes (Signed)
Remote ICD transmission.   

## 2018-09-19 LAB — CUP PACEART REMOTE DEVICE CHECK
Battery Remaining Longevity: 96 mo
Battery Voltage: 3.01 V
Brady Statistic AP VP Percent: 0.04 %
Brady Statistic AP VS Percent: 44.01 %
Brady Statistic AS VP Percent: 0.05 %
Brady Statistic AS VS Percent: 55.9 %
Brady Statistic RA Percent Paced: 43.88 %
Brady Statistic RV Percent Paced: 0.09 %
Date Time Interrogation Session: 20191230083425
HighPow Impedance: 56 Ohm
Implantable Lead Implant Date: 20170922
Implantable Lead Implant Date: 20170922
Implantable Lead Location: 753859
Implantable Lead Location: 753860
Implantable Lead Model: 5076
Implantable Pulse Generator Implant Date: 20170922
Lead Channel Impedance Value: 304 Ohm
Lead Channel Impedance Value: 399 Ohm
Lead Channel Impedance Value: 399 Ohm
Lead Channel Pacing Threshold Amplitude: 0.5 V
Lead Channel Pacing Threshold Amplitude: 0.625 V
Lead Channel Pacing Threshold Pulse Width: 0.4 ms
Lead Channel Pacing Threshold Pulse Width: 0.4 ms
Lead Channel Sensing Intrinsic Amplitude: 2.375 mV
Lead Channel Sensing Intrinsic Amplitude: 2.375 mV
Lead Channel Sensing Intrinsic Amplitude: 8.375 mV
Lead Channel Sensing Intrinsic Amplitude: 8.375 mV
Lead Channel Setting Pacing Amplitude: 2 V
Lead Channel Setting Pacing Amplitude: 2.5 V
Lead Channel Setting Pacing Pulse Width: 0.4 ms
Lead Channel Setting Sensing Sensitivity: 0.3 mV

## 2018-09-22 DIAGNOSIS — J309 Allergic rhinitis, unspecified: Secondary | ICD-10-CM | POA: Diagnosis not present

## 2018-09-22 DIAGNOSIS — I498 Other specified cardiac arrhythmias: Secondary | ICD-10-CM | POA: Diagnosis not present

## 2018-09-22 DIAGNOSIS — K219 Gastro-esophageal reflux disease without esophagitis: Secondary | ICD-10-CM | POA: Diagnosis not present

## 2018-09-22 DIAGNOSIS — I739 Peripheral vascular disease, unspecified: Secondary | ICD-10-CM | POA: Diagnosis not present

## 2018-09-22 DIAGNOSIS — E876 Hypokalemia: Secondary | ICD-10-CM | POA: Diagnosis not present

## 2018-09-22 DIAGNOSIS — N183 Chronic kidney disease, stage 3 (moderate): Secondary | ICD-10-CM | POA: Diagnosis not present

## 2018-09-22 DIAGNOSIS — J069 Acute upper respiratory infection, unspecified: Secondary | ICD-10-CM | POA: Diagnosis not present

## 2018-09-22 DIAGNOSIS — M159 Polyosteoarthritis, unspecified: Secondary | ICD-10-CM | POA: Diagnosis not present

## 2018-09-22 DIAGNOSIS — N529 Male erectile dysfunction, unspecified: Secondary | ICD-10-CM | POA: Diagnosis not present

## 2018-09-28 DIAGNOSIS — E7800 Pure hypercholesterolemia, unspecified: Secondary | ICD-10-CM | POA: Diagnosis not present

## 2018-09-28 DIAGNOSIS — E876 Hypokalemia: Secondary | ICD-10-CM | POA: Diagnosis not present

## 2018-09-28 DIAGNOSIS — I10 Essential (primary) hypertension: Secondary | ICD-10-CM | POA: Diagnosis not present

## 2018-09-28 DIAGNOSIS — N529 Male erectile dysfunction, unspecified: Secondary | ICD-10-CM | POA: Diagnosis not present

## 2018-09-28 DIAGNOSIS — M159 Polyosteoarthritis, unspecified: Secondary | ICD-10-CM | POA: Diagnosis not present

## 2018-09-28 DIAGNOSIS — N183 Chronic kidney disease, stage 3 (moderate): Secondary | ICD-10-CM | POA: Diagnosis not present

## 2018-09-28 DIAGNOSIS — K219 Gastro-esophageal reflux disease without esophagitis: Secondary | ICD-10-CM | POA: Diagnosis not present

## 2018-09-28 DIAGNOSIS — I498 Other specified cardiac arrhythmias: Secondary | ICD-10-CM | POA: Diagnosis not present

## 2018-09-28 DIAGNOSIS — I739 Peripheral vascular disease, unspecified: Secondary | ICD-10-CM | POA: Diagnosis not present

## 2018-09-28 DIAGNOSIS — E118 Type 2 diabetes mellitus with unspecified complications: Secondary | ICD-10-CM | POA: Diagnosis not present

## 2018-10-05 DIAGNOSIS — M25562 Pain in left knee: Secondary | ICD-10-CM | POA: Diagnosis not present

## 2018-10-05 DIAGNOSIS — Z7901 Long term (current) use of anticoagulants: Secondary | ICD-10-CM | POA: Diagnosis not present

## 2018-10-05 DIAGNOSIS — I498 Other specified cardiac arrhythmias: Secondary | ICD-10-CM | POA: Diagnosis not present

## 2018-10-05 DIAGNOSIS — I739 Peripheral vascular disease, unspecified: Secondary | ICD-10-CM | POA: Diagnosis not present

## 2018-10-05 DIAGNOSIS — N529 Male erectile dysfunction, unspecified: Secondary | ICD-10-CM | POA: Diagnosis not present

## 2018-10-05 DIAGNOSIS — K219 Gastro-esophageal reflux disease without esophagitis: Secondary | ICD-10-CM | POA: Diagnosis not present

## 2018-10-05 DIAGNOSIS — M159 Polyosteoarthritis, unspecified: Secondary | ICD-10-CM | POA: Diagnosis not present

## 2018-10-05 DIAGNOSIS — N183 Chronic kidney disease, stage 3 (moderate): Secondary | ICD-10-CM | POA: Diagnosis not present

## 2018-10-05 DIAGNOSIS — E876 Hypokalemia: Secondary | ICD-10-CM | POA: Diagnosis not present

## 2018-10-10 NOTE — Progress Notes (Deleted)
History of Present Illness:  Patient is a 73 y.o. year old male who presents for evaluation of claudication. He also had infected hardware removed from his right foot. He has developed a wound on his right foot. 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   We have been managing his wound nonoperatively.   The right foot wound has completely healed at this time.     He presents for repeat ABI's.  The patient currently describes a cramping sensation in the {Desc; right/left/both:390000001} lower extremity. There {Is/is not:9024} rest pain.  There is no history of ulcerations on the feet.  Atherosclerotic risk factors and other medical problems include ***  Past Medical History:  Diagnosis Date  . Atrial fibrillation ([place])   . 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 Diabetes mellitus without complication ([place])   . Endocarditis   . Hypertension   . Peripheral vascular disease ([place])   . PVC's (premature ventricular contractions)   . SVT (supraventricular tachycardia)  long RP     Past Surgical History:  Procedure Laterality Date  . ABDOMINAL AORTAGRAM N/A 10/03/2012   Procedure: ABDOMINAL Maxcine Ham;  Surgeon: [name], MD;  Location: [facility] CATH LAB;  Service: Cardiovascular;  Laterality: N/A;  . CORONARY ANGIOGRAM  09/21/2012   Procedure: CORONARY ANGIOGRAM;  Surgeon: [name], MD;  Location: [facility] CATH LAB;  Service: Cardiovascular;;  . EP IMPLANTABLE DEVICE N/A 06/11/2016   Procedure: ICD Implant;  Surgeon: Will Meredith Leeds, MD;  Location: [place] CV LAB;  Service: Cardiovascular;  Laterality: N/A;  . EXTREMITY WIRE/PIN REMOVAL  09/14/2012   Procedure: REMOVAL K-WIRE/PIN EXTREMITY;  Surgeon: [name], MD;  Location: [place];  Service: Orthopedics;  Laterality: Right;  Right Foot  . I&D EXTREMITY  09/14/2012   Procedure: IRRIGATION AND DEBRIDEMENT EXTREMITY;  Surgeon: Tennis Must, MD;  Location: [place];  Service: Orthopedics;  Laterality: Right;  . INTRAOPERATIVE TRANSESOPHAGEAL ECHOCARDIOGRAM  09/26/2012   Procedure: INTRAOPERATIVE TRANSESOPHAGEAL ECHOCARDIOGRAM;  Surgeon: [name], MD;  Location: [facility] OR;  Service: Open Heart Surgery;  Laterality: N/A;  . MITRAL VALVE REPLACEMENT  09/26/2012   Procedure: MITRAL VALVE (MV) REPLACEMENT;  Surgeon: [name], MD;  Location: [place] OR;  Service: Open Heart Surgery;  Laterality: N/A;  . RIGHT HEART CATHETERIZATION  09/21/2012   Procedure: RIGHT HEART CATH;  Surgeon: [name], MD;  Location: [facility] CATH LAB;  Service: Cardiovascular;;  . TEE WITHOUT CARDIOVERSION  09/18/2012   Procedure: TRANSESOPHAGEAL ECHOCARDIOGRAM (TEE);  Surgeon: [name], MD;  Location[facility] ENDOSCOPY;  Service: Cardiovascular;  Laterality: N/A;    ROS:   General:  No weight loss, Fever, chills  HEENT: No recent headaches, no nasal bleeding, no visual changes, no sore throat  Neurologic: No dizziness, blackouts, seizures. No recent symptoms of stroke or mini- stroke. No recent episodes of slurred speech, or temporary blindness.  Cardiac: No recent episodes of chest pain/pressure, no shortness of breath at rest.  No shortness of breath with exertion.  Denies history of atrial fibrillation or irregular heartbeat  Vascular: No history of rest pain in feet.  No history of claudication.  No history of non-healing ulcer, No history of DVT   Pulmonary: No home oxygen, no productive cough, no hemoptysis,  No asthma or wheezing  Musculoskeletal:  [ ]  Arthritis, [ ]  Low back pain,  [ ]  Joint  pain  Hematologic:No history of hypercoagulable state.  No history of easy bleeding.  No history of anemia  Gastrointestinal: No hematochezia or melena,  No gastroesophageal reflux, no trouble swallowing  Urinary: [ ]  chronic Kidney disease, [ ]  on HD - [ ]  MWF or [ ]  TTHS, [ ]  Burning with urination, [ ]  Frequent urination, [ ]  Difficulty urinating;    Skin: No rashes  Psychological: No history of anxiety,  No history of depression  Social History Social History   Tobacco Use  . Smoking status: Never Smoker  . Smokeless tobacco: Never Used  Substance Use Topics  . Alcohol use: No    Alcohol/week: 0.0 standard drinks  . Drug use: No    Family History Family History  Problem Relation Age of Onset  . Hypertension Mother   . Hypertension Father     Allergies  Allergies  Allergen Reactions  . Geralyn Flash [Fish Allergy] Nausea And Vomiting     Current Outpatient Medications  Medication Sig Dispense Refill  . atorvastatin (LIPITOR) 40 MG tablet Take 1 tablet (40 mg total) by mouth daily at 6 PM. 30 tablet 5  . HYDROcodone-acetaminophen (NORCO/VICODIN) 5-325 MG per tablet Take 1 tablet by mouth 2 (two) times daily as needed for moderate pain.     Marland Kitchen losartan-hydrochlorothiazide (HYZAAR) 100-25 MG tablet Take 1 tablet by mouth daily.  12  . metoprolol succinate (TOPROL-XL) 25 MG 24 hr tablet TAKE 1 TABLET DAILY 30 tablet 9  . mexiletine (MEXITIL) 250 MG capsule Take 1 capsule (250 mg total) by mouth 2 (two) times daily. 60 capsule 6  . pantoprazole (PROTONIX) 40 MG tablet Take 40 mg by mouth daily.     . potassium chloride SA (K-DUR,KLOR-CON) 20 MEQ tablet Take 20 mEq by mouth daily.     . sildenafil (VIAGRA) 100 MG tablet Take 1 tablet (100 mg total) by mouth as needed for erectile dysfunction. 10 tablet 0  . tamsulosin (FLOMAX) 0.4 MG CAPS capsule Take 1 capsule by mouth daily.    Marland Kitchen triamcinolone cream (KENALOG) 0.5 % Apply 1 application topically as needed (SKIN).     . valsartan (DIOVAN) 80 MG tablet Take 1 tablet (80 mg total) by mouth daily. 30 tablet 6  . warfarin (COUMADIN) 4 MG tablet Take 4 mg by mouth daily.     No current facility-administered medications for this visit.     Physical Examination  There were no vitals filed for this visit.  There is no height or weight on file to calculate BMI.  General:  Alert  and oriented, no acute distress HEENT: Normal Neck: No bruit or JVD Pulmonary: Clear to auscultation bilaterally Cardiac: Regular Rate and Rhythm without murmur Abdomen: Soft, non-tender, non-distended, no mass, no scars Skin: No rash Extremity Pulses:  2+ radial, brachial, femoral, dorsalis pedis, posterior tibial pulses bilaterally Musculoskeletal: No deformity or edema  Neurologic: Upper and lower extremity motor 5/5 and symmetric  DATA: ***   ASSESSMENT: ***   PLAN: ***   Roxy Horseman PA-C [facility] [place] Office: [phone]  MD in clinic:***

## 2018-10-12 ENCOUNTER — Other Ambulatory Visit: Payer: Self-pay

## 2018-10-12 ENCOUNTER — Ambulatory Visit (HOSPITAL_COMMUNITY)
Admission: RE | Admit: 2018-10-12 | Discharge: 2018-10-12 | Disposition: A | Discharge status: Home / Self Care | Payer: Medicare HMO | Source: Ambulatory Visit | Attending: Family | Admitting: Family

## 2018-10-12 ENCOUNTER — Ambulatory Visit: Payer: Medicare HMO | Admitting: Family

## 2018-10-12 ENCOUNTER — Ambulatory Visit: Payer: Medicare HMO | Admitting: Physician Assistant

## 2018-10-12 ENCOUNTER — Encounter (HOSPITAL_COMMUNITY): Payer: Medicare HMO

## 2018-10-12 VITALS — BP 143/83 | HR 89 | Temp 97.2°F | Resp 20 | Ht 72.0 in | Wt 203.0 lb

## 2018-10-12 DIAGNOSIS — I70201 Unspecified atherosclerosis of native arteries of extremities, right leg: Secondary | ICD-10-CM | POA: Diagnosis not present

## 2018-10-12 DIAGNOSIS — I779 Disorder of arteries and arterioles, unspecified: Secondary | ICD-10-CM

## 2018-10-12 DIAGNOSIS — I739 Peripheral vascular disease, unspecified: Secondary | ICD-10-CM | POA: Diagnosis not present

## 2018-10-12 NOTE — Progress Notes (Signed)
History of Present Illness:  Ryan [name] is a 73 y.o. male whom Dr. Trula Slade met in the hospital.  He was originally referred for possible right leg ischemia.  He also had infected hardware removed from his right foot in December 2013. He developed a wound on his right foot. He underwent angiography which revealed an occluded right popliteal artery. We had been managing his wound nonoperatively. He was visiting the wound center frequently and has since been discharged from their care. He denise symptoms of claudication and rest pain.   He is able to ambulate without difficulty although he does use a cane around the house occasionally.     His previous ABI's showed monophasic flow with right TBI of 35% and left TBI of 47%.  He has single vessel runoff of the Peroneal artery bilaterally.    Past medical history: HTN, PAD, A fib(manged with Coumadin), ICD right chest wall.       Past Medical History:  Diagnosis Date  . Atrial fibrillation ([place])   . 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 Diabetes mellitus without complication ([place])   . Endocarditis   . Hypertension   . Peripheral vascular disease ([place])   . PVC's (premature ventricular contractions)   . SVT (supraventricular tachycardia)  long RP     Past Surgical History:  Procedure Laterality Date  . ABDOMINAL AORTAGRAM N/A 10/03/2012   Procedure: ABDOMINAL Maxcine Ham;  Surgeon: [name], MD;  Location: [facility] CATH LAB;  Service: Cardiovascular;  Laterality: N/A;  . CORONARY ANGIOGRAM  09/21/2012   Procedure: CORONARY ANGIOGRAM;  Surgeon: [name], MD;  Location: [facility] CATH LAB;  Service: Cardiovascular;;  . EP IMPLANTABLE DEVICE N/A 06/11/2016   Procedure: ICD Implant;  Surgeon: Will Meredith Leeds, MD;  Location: [place] CV LAB;  Service: Cardiovascular;  Laterality: N/A;  . EXTREMITY WIRE/PIN REMOVAL  09/14/2012   Procedure: REMOVAL K-WIRE/PIN EXTREMITY;  Surgeon: [name], MD;  Location: [place];  Service: Orthopedics;   Laterality: Right;  Right Foot  . I&D EXTREMITY  09/14/2012   Procedure: IRRIGATION AND DEBRIDEMENT EXTREMITY;  Surgeon: Tennis Must, MD;  Location: [place];  Service: Orthopedics;  Laterality: Right;  . INTRAOPERATIVE TRANSESOPHAGEAL ECHOCARDIOGRAM  09/26/2012   Procedure: INTRAOPERATIVE TRANSESOPHAGEAL ECHOCARDIOGRAM;  Surgeon: [name], MD;  Location: [facility] OR;  Service: Open Heart Surgery;  Laterality: N/A;  . MITRAL VALVE REPLACEMENT  09/26/2012   Procedure: MITRAL VALVE (MV) REPLACEMENT;  Surgeon: [name], MD;  Location: [place] OR;  Service: Open Heart Surgery;  Laterality: N/A;  . RIGHT HEART CATHETERIZATION  09/21/2012   Procedure: RIGHT HEART CATH;  Surgeon: [name], MD;  Location: [facility] CATH LAB;  Service: Cardiovascular;;  . TEE WITHOUT CARDIOVERSION  09/18/2012   Procedure: TRANSESOPHAGEAL ECHOCARDIOGRAM (TEE);  Surgeon: [name], MD;  Location: [facility] ENDOSCOPY;  Service: Cardiovascular;  Laterality: N/A;    ROS:   General:  No weight loss, Fever, chills  HEENT: No recent headaches, no nasal bleeding, no visual changes, no sore throat  Neurologic: No dizziness, blackouts, seizures. No recent symptoms of stroke or mini- stroke. No recent episodes of slurred speech, or temporary blindness.  Cardiac: No recent episodes of chest pain/pressure, no shortness of breath at rest.  No shortness of breath with exertion.  Denies history of atrial fibrillation or irregular heartbeat  Vascular: No history of rest pain in feet.  No history of claudication.  No history of non-healing ulcer, No  history of DVT   Pulmonary: No home oxygen, no productive cough, no hemoptysis,  No asthma or wheezing  Musculoskeletal:  '[ ]'$  Arthritis, '[ ]'$  Low back pain,  '[ ]'$  Joint pain  Hematologic:No history of hypercoagulable state.  No history of easy bleeding.  No history of anemia  Gastrointestinal: No hematochezia or melena,  No gastroesophageal reflux, no trouble swallowing  Urinary: '[ ]'$  chronic  Kidney disease, '[ ]'$  on HD - '[ ]'$  MWF or '[ ]'$  TTHS, '[ ]'$  Burning with urination, '[ ]'$  Frequent urination, '[ ]'$  Difficulty urinating;   Skin: No rashes  Psychological: No history of anxiety,  No history of depression  Social History Social History   Tobacco Use  . Smoking status: Never Smoker  . Smokeless tobacco: Never Used  Substance Use Topics  . Alcohol use: No    Alcohol/week: 0.0 standard drinks  . Drug use: No    Family History Family History  Problem Relation Age of Onset  . Hypertension Mother   . Hypertension Father     Allergies  Allergies  Allergen Reactions  . Geralyn Flash [Fish Allergy] Nausea And Vomiting     Current Outpatient Medications  Medication Sig Dispense Refill  . atorvastatin (LIPITOR) 40 MG tablet Take 1 tablet (40 mg total) by mouth daily at 6 PM. 30 tablet 5  . HYDROcodone-acetaminophen (NORCO/VICODIN) 5-325 MG per tablet Take 1 tablet by mouth 2 (two) times daily as needed for moderate pain.     Marland Kitchen losartan-hydrochlorothiazide (HYZAAR) 100-25 MG tablet Take 1 tablet by mouth daily.  12  . metoprolol succinate (TOPROL-XL) 25 MG 24 hr tablet TAKE 1 TABLET DAILY 30 tablet 9  . mexiletine (MEXITIL) 250 MG capsule Take 1 capsule (250 mg total) by mouth 2 (two) times daily. 60 capsule 6  . pantoprazole (PROTONIX) 40 MG tablet Take 40 mg by mouth daily.     . potassium chloride SA (K-DUR,KLOR-CON) 20 MEQ tablet Take 20 mEq by mouth daily.     . tamsulosin (FLOMAX) 0.4 MG CAPS capsule Take 1 capsule by mouth daily.    Marland Kitchen triamcinolone cream (KENALOG) 0.5 % Apply 1 application topically as needed (SKIN).     . valsartan (DIOVAN) 80 MG tablet Take 1 tablet (80 mg total) by mouth daily. 30 tablet 6  . warfarin (COUMADIN) 4 MG tablet Take 4 mg by mouth daily.    . sildenafil (VIAGRA) 100 MG tablet Take 1 tablet (100 mg total) by mouth as needed for erectile dysfunction. (Patient not taking: Reported on 10/12/2018) 10 tablet 0   No current facility-administered  medications for this visit.     Physical Examination  Vitals:   10/12/18 1145  BP: (!) 143/83  Pulse: 89  Resp: 20  Temp: (!) 97.2 F (36.2 C)  SpO2: 99%  Weight: 203 lb (92.1 kg)  Height: 6' (1.829 m)    Body mass index is 27.53 kg/m.  General:  Alert and oriented, no acute distress HEENT: Normal, normocephalic Neck: No bruit or JVD Pulmonary: Clear to auscultation bilaterally Cardiac: Regular Rate and Rhythm without murmur Abdomen: Soft, non-tender, non-distended, no mass, no scars Skin: No rash Extremity Pulses:  2+ radial, brachial, femoral, no dorsalis pedis, posterior tibial pulses bilaterally.  No open wounds on B feet.  Skin is warm and dry.   Musculoskeletal: No deformity or edema  Neurologic: Upper and lower extremity motor 5/5 and symmetric  DATA:  ABI's 10/12/2018 Right monophasic 77% TBI 44% Left 86% and TBI 64%  ASSESSMENT:  PAD with history of slow healy right foot wound s/p infected hardware removal s/p bunion surgery.    PLAN:  His ABI are essentially unchanged and he has no symptoms of claudication or rest pain.  According to the angiogram he has B single vessel run off of the peroneal artery and with right popliteal artery occlusion which the peroneal artery reconstitutes 12 cm distal to the initial occlusion. This is the dominant vessel across the ankle via collaterals.  The left posterior tibial and anterior tibial arteries are occluded. The dominant runoff is the peroneal artery. There is limited visualization of the peroneal collaterals on the foot.    I recommend he walk as much as possible and always check his feet for non healing wounds.  He will f/u in 6 months for repeat ABI's.  He will call sooner if he problems or concerns.     Ruta Hinds, MD [facility] [place] Office: [phone] Pager: [phone]

## 2018-10-19 DIAGNOSIS — M109 Gout, unspecified: Secondary | ICD-10-CM | POA: Diagnosis not present

## 2018-10-19 DIAGNOSIS — M159 Polyosteoarthritis, unspecified: Secondary | ICD-10-CM | POA: Diagnosis not present

## 2018-10-19 DIAGNOSIS — N183 Chronic kidney disease, stage 3 (moderate): Secondary | ICD-10-CM | POA: Diagnosis not present

## 2018-10-19 DIAGNOSIS — E876 Hypokalemia: Secondary | ICD-10-CM | POA: Diagnosis not present

## 2018-10-19 DIAGNOSIS — N529 Male erectile dysfunction, unspecified: Secondary | ICD-10-CM | POA: Diagnosis not present

## 2018-10-19 DIAGNOSIS — J309 Allergic rhinitis, unspecified: Secondary | ICD-10-CM | POA: Diagnosis not present

## 2018-10-19 DIAGNOSIS — K219 Gastro-esophageal reflux disease without esophagitis: Secondary | ICD-10-CM | POA: Diagnosis not present

## 2018-10-19 DIAGNOSIS — I498 Other specified cardiac arrhythmias: Secondary | ICD-10-CM | POA: Diagnosis not present

## 2018-10-19 DIAGNOSIS — I739 Peripheral vascular disease, unspecified: Secondary | ICD-10-CM | POA: Diagnosis not present

## 2018-11-03 DIAGNOSIS — N529 Male erectile dysfunction, unspecified: Secondary | ICD-10-CM | POA: Diagnosis not present

## 2018-11-03 DIAGNOSIS — N183 Chronic kidney disease, stage 3 (moderate): Secondary | ICD-10-CM | POA: Diagnosis not present

## 2018-11-03 DIAGNOSIS — E876 Hypokalemia: Secondary | ICD-10-CM | POA: Diagnosis not present

## 2018-11-03 DIAGNOSIS — K219 Gastro-esophageal reflux disease without esophagitis: Secondary | ICD-10-CM | POA: Diagnosis not present

## 2018-11-03 DIAGNOSIS — I498 Other specified cardiac arrhythmias: Secondary | ICD-10-CM | POA: Diagnosis not present

## 2018-11-03 DIAGNOSIS — M159 Polyosteoarthritis, unspecified: Secondary | ICD-10-CM | POA: Diagnosis not present

## 2018-11-03 DIAGNOSIS — M25561 Pain in right knee: Secondary | ICD-10-CM | POA: Diagnosis not present

## 2018-11-03 DIAGNOSIS — I739 Peripheral vascular disease, unspecified: Secondary | ICD-10-CM | POA: Diagnosis not present

## 2018-11-03 DIAGNOSIS — Z7901 Long term (current) use of anticoagulants: Secondary | ICD-10-CM | POA: Diagnosis not present

## 2018-11-15 DIAGNOSIS — I739 Peripheral vascular disease, unspecified: Secondary | ICD-10-CM | POA: Diagnosis not present

## 2018-11-15 DIAGNOSIS — N529 Male erectile dysfunction, unspecified: Secondary | ICD-10-CM | POA: Diagnosis not present

## 2018-11-15 DIAGNOSIS — E876 Hypokalemia: Secondary | ICD-10-CM | POA: Diagnosis not present

## 2018-11-15 DIAGNOSIS — I498 Other specified cardiac arrhythmias: Secondary | ICD-10-CM | POA: Diagnosis not present

## 2018-11-15 DIAGNOSIS — N183 Chronic kidney disease, stage 3 (moderate): Secondary | ICD-10-CM | POA: Diagnosis not present

## 2018-11-15 DIAGNOSIS — M159 Polyosteoarthritis, unspecified: Secondary | ICD-10-CM | POA: Diagnosis not present

## 2018-11-15 DIAGNOSIS — M109 Gout, unspecified: Secondary | ICD-10-CM | POA: Diagnosis not present

## 2018-11-15 DIAGNOSIS — K219 Gastro-esophageal reflux disease without esophagitis: Secondary | ICD-10-CM | POA: Diagnosis not present

## 2018-11-15 DIAGNOSIS — J309 Allergic rhinitis, unspecified: Secondary | ICD-10-CM | POA: Diagnosis not present

## 2018-12-13 DIAGNOSIS — M159 Polyosteoarthritis, unspecified: Secondary | ICD-10-CM | POA: Diagnosis not present

## 2018-12-13 DIAGNOSIS — I498 Other specified cardiac arrhythmias: Secondary | ICD-10-CM | POA: Diagnosis not present

## 2018-12-13 DIAGNOSIS — J309 Allergic rhinitis, unspecified: Secondary | ICD-10-CM | POA: Diagnosis not present

## 2018-12-13 DIAGNOSIS — K219 Gastro-esophageal reflux disease without esophagitis: Secondary | ICD-10-CM | POA: Diagnosis not present

## 2018-12-13 DIAGNOSIS — N329 Bladder disorder, unspecified: Secondary | ICD-10-CM | POA: Diagnosis not present

## 2018-12-13 DIAGNOSIS — N183 Chronic kidney disease, stage 3 (moderate): Secondary | ICD-10-CM | POA: Diagnosis not present

## 2018-12-13 DIAGNOSIS — Z7901 Long term (current) use of anticoagulants: Secondary | ICD-10-CM | POA: Diagnosis not present

## 2018-12-13 DIAGNOSIS — E876 Hypokalemia: Secondary | ICD-10-CM | POA: Diagnosis not present

## 2018-12-13 DIAGNOSIS — I739 Peripheral vascular disease, unspecified: Secondary | ICD-10-CM | POA: Diagnosis not present

## 2018-12-18 ENCOUNTER — Other Ambulatory Visit: Payer: Self-pay

## 2018-12-18 ENCOUNTER — Ambulatory Visit (INDEPENDENT_AMBULATORY_CARE_PROVIDER_SITE_OTHER): Payer: Medicare HMO | Admitting: *Deleted

## 2018-12-18 DIAGNOSIS — I428 Other cardiomyopathies: Secondary | ICD-10-CM

## 2018-12-18 DIAGNOSIS — I5022 Chronic systolic (congestive) heart failure: Secondary | ICD-10-CM

## 2018-12-18 LAB — CUP PACEART REMOTE DEVICE CHECK
Battery Remaining Longevity: 93 mo
Battery Voltage: 3 V
Brady Statistic AP VP Percent: 0.03 %
Brady Statistic AP VS Percent: 45.42 %
Brady Statistic AS VP Percent: 0.05 %
Brady Statistic AS VS Percent: 54.5 %
Brady Statistic RA Percent Paced: 45.36 %
Brady Statistic RV Percent Paced: 0.08 %
Date Time Interrogation Session: 20200330073625
HighPow Impedance: 54 Ohm
Implantable Lead Implant Date: 20170922
Implantable Lead Implant Date: 20170922
Implantable Lead Location: 753859
Implantable Lead Location: 753860
Implantable Lead Model: 5076
Implantable Pulse Generator Implant Date: 20170922
Lead Channel Impedance Value: 342 Ohm
Lead Channel Impedance Value: 418 Ohm
Lead Channel Impedance Value: 418 Ohm
Lead Channel Pacing Threshold Amplitude: 0.5 V
Lead Channel Pacing Threshold Amplitude: 0.625 V
Lead Channel Pacing Threshold Pulse Width: 0.4 ms
Lead Channel Pacing Threshold Pulse Width: 0.4 ms
Lead Channel Sensing Intrinsic Amplitude: 1.875 mV
Lead Channel Sensing Intrinsic Amplitude: 1.875 mV
Lead Channel Sensing Intrinsic Amplitude: 10.25 mV
Lead Channel Sensing Intrinsic Amplitude: 10.25 mV
Lead Channel Setting Pacing Amplitude: 2 V
Lead Channel Setting Pacing Amplitude: 2.5 V
Lead Channel Setting Pacing Pulse Width: 0.4 ms
Lead Channel Setting Sensing Sensitivity: 0.3 mV

## 2018-12-19 ENCOUNTER — Telehealth: Payer: Self-pay | Admitting: *Deleted

## 2018-12-19 NOTE — Telephone Encounter (Signed)
Called patient to let them know due to recent [place] and Health Department Protocols, we are not seeing patients in the office. We are instead seeing if they would like to schedule this appointment as a Research scientist (medical) or Laptop. Patient is aware if they decide to reschedule this appointment, they may not be seen or scheduled for the next 4-6 months. Patient at this time declines WebEx. Patient does not have access to a computer, internet, or smart phone. Patient will see if granddaughter is able to pull it up on her phone or computer. Patient would like a cal back early next week.  Message sent nurse.

## 2018-12-19 NOTE — Telephone Encounter (Signed)
Pt denies any issues since last being seen. Pt understands office will call him to re-schedule when current pandemic has passed. Advised to call the office if he has any issues with his heart.  Patient verbalized understanding and agreeable to plan.

## 2018-12-27 NOTE — Telephone Encounter (Signed)
Pt agreeable to telephone visit with Dr. [name] tomorrow. Pt understands [place] will call him around 11:15am.

## 2018-12-27 NOTE — Progress Notes (Signed)
Remote ICD transmission.   

## 2018-12-28 ENCOUNTER — Telehealth (INDEPENDENT_AMBULATORY_CARE_PROVIDER_SITE_OTHER): Payer: Medicare HMO | Admitting: Cardiology

## 2018-12-28 ENCOUNTER — Ambulatory Visit: Payer: Medicare HMO | Admitting: Cardiology

## 2018-12-28 ENCOUNTER — Other Ambulatory Visit: Payer: Self-pay

## 2018-12-28 DIAGNOSIS — I493 Ventricular premature depolarization: Secondary | ICD-10-CM

## 2018-12-28 DIAGNOSIS — I480 Paroxysmal atrial fibrillation: Secondary | ICD-10-CM | POA: Diagnosis not present

## 2018-12-28 NOTE — Progress Notes (Signed)
**Note Ryan-Identified via Obfuscation** Electrophysiology TeleHealth Note   Due to national recommendations of social distancing due to COVID 19, an audio/video telehealth visit is felt to be most appropriate for this patient at this time.  See MyChart message from today for the patient's consent to telehealth for [facility].   Date:  12/28/2018   ID:  DELEON [name], DOB 1946-01-23, MRN [id number]  Location: patient's home  Provider location: [address], [place]  Evaluation Performed: Follow-up visit  PCP:  [name], MD  Cardiologist:  No primary care provider on file.  Electrophysiologist:  Dr [name]  Chief Complaint:  CHF  History of Present Illness:    Ryan [name] is a 73 y.o. male who presents via audio/video conferencing for a telehealth visit today.  Since last being seen in our clinic, the patient reports doing very well.  Today, he denies symptoms of palpitations, chest pain, shortness of breath,  lower extremity edema, dizziness, presyncope, or syncope.  The patient is otherwise without complaint today.  The patient denies symptoms of fevers, chills, cough, or new SOB worrisome for COVID 19.  He has a history of a bioprosthetic mitral valve replacement in 2014, atrial fibrillation/flutter, diastolic heart failure, hypertension, type 2 diabetes.  He wore a cardiac monitor that showed a high volume of PVCs up to 20%.  Echocardiogram showed an ejection fraction of 30 to 35%.  He was put on [name] but his ejection fraction remains low.  He thus had an ICD implanted.  Today, denies symptoms of palpitations, chest pain, shortness of breath, orthopnea, PND, lower extremity edema, claudication, dizziness, presyncope, syncope, bleeding, or neurologic sequela. The patient is tolerating medications without difficulties.  He is overall doing well.  His main complaint today is heartburn.  He says that he has heartburn at night after he takes his mexiletine.  He takes it right before going to bed and  then immediately lays down.  Otherwise he has no chest pain.  His shortness of breath has remained stable and really only occurs when he is exerting himself.  Past Medical History:  Diagnosis Date  . Atrial fibrillation ([place])   . 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 Diabetes mellitus without complication ([place])   . Endocarditis   . Hypertension   . Peripheral vascular disease ([place])   . PVC's (premature ventricular contractions)   . SVT (supraventricular tachycardia)  long RP     Past Surgical History:  Procedure Laterality Date  . ABDOMINAL AORTAGRAM N/A 10/03/2012   Procedure: ABDOMINAL Maxcine Ham;  Surgeon: [name], MD;  Location: [facility] CATH LAB;  Service: Cardiovascular;  Laterality: N/A;  . CORONARY ANGIOGRAM  09/21/2012   Procedure: CORONARY ANGIOGRAM;  Surgeon: [name], MD;  Location: [facility] CATH LAB;  Service: Cardiovascular;;  . EP IMPLANTABLE DEVICE N/A 06/11/2016   Procedure: ICD Implant;  Surgeon: Elius Etheredge Meredith Leeds, MD;  Location: [place] CV LAB;  Service: Cardiovascular;  Laterality: N/A;  . EXTREMITY WIRE/PIN REMOVAL  09/14/2012   Procedure: REMOVAL K-WIRE/PIN EXTREMITY;  Surgeon: [name], MD;  Location: [place];  Service: Orthopedics;  Laterality: Right;  Right Foot  . I&D EXTREMITY  09/14/2012   Procedure: IRRIGATION AND DEBRIDEMENT EXTREMITY;  Surgeon: Tennis Must, MD;  Location: [place];  Service: Orthopedics;  Laterality: Right;  . INTRAOPERATIVE TRANSESOPHAGEAL ECHOCARDIOGRAM  09/26/2012   Procedure: INTRAOPERATIVE TRANSESOPHAGEAL ECHOCARDIOGRAM;  Surgeon: [name], MD;  Location: [facility] OR;  Service: Open Heart Surgery;  Laterality:  N/A;  . MITRAL VALVE REPLACEMENT  09/26/2012   Procedure: MITRAL VALVE (MV) REPLACEMENT;  Surgeon: [name], MD;  Location: [place] OR;  Service: Open Heart Surgery;  Laterality: N/A;  . RIGHT HEART CATHETERIZATION  09/21/2012   Procedure: RIGHT HEART CATH;  Surgeon: [name], MD;  Location: [facility] CATH LAB;  Service:  Cardiovascular;;  . TEE WITHOUT CARDIOVERSION  09/18/2012   Procedure: TRANSESOPHAGEAL ECHOCARDIOGRAM (TEE);  Surgeon: [name], MD;  Location: [facility] ENDOSCOPY;  Service: Cardiovascular;  Laterality: N/A;    Current Outpatient Medications  Medication Sig Dispense Refill  . atorvastatin (LIPITOR) 40 MG tablet Take 1 tablet (40 mg total) by mouth daily at 6 PM. 30 tablet 5  . HYDROcodone-acetaminophen (NORCO/VICODIN) 5-325 MG per tablet Take 1 tablet by mouth 2 (two) times daily as needed for moderate pain.     Marland Kitchen losartan-hydrochlorothiazide (HYZAAR) 100-25 MG tablet Take 1 tablet by mouth daily.  12  . metoprolol succinate (TOPROL-XL) 25 MG 24 hr tablet TAKE 1 TABLET DAILY 30 tablet 9  . mexiletine (MEXITIL) 250 MG capsule Take 1 capsule (250 mg total) by mouth 2 (two) times daily. 60 capsule 6  . pantoprazole (PROTONIX) 40 MG tablet Take 40 mg by mouth daily.     . potassium chloride SA (K-DUR,KLOR-CON) 20 MEQ tablet Take 20 mEq by mouth daily.     . sildenafil (VIAGRA) 100 MG tablet Take 1 tablet (100 mg total) by mouth as needed for erectile dysfunction. 10 tablet 0  . tamsulosin (FLOMAX) 0.4 MG CAPS capsule Take 1 capsule by mouth daily.    Marland Kitchen triamcinolone cream (KENALOG) 0.5 % Apply 1 application topically as needed (SKIN).     . valsartan (DIOVAN) 80 MG tablet Take 1 tablet (80 mg total) by mouth daily. 30 tablet 6  . warfarin (COUMADIN) 4 MG tablet Take 4 mg by mouth daily.     No current facility-administered medications for this visit.     Allergies:   [name] allergy]   Social History:  The patient  reports that he has never smoked. He has never used smokeless tobacco. He reports that he does not drink alcohol or use drugs.   Family History:  The patient's  family history includes Hypertension in his father and mother.   ROS:  Please see the history of present illness.   All other systems are personally reviewed and negative.    Exam:    Vital Signs:  There were no  vitals taken for this visit.  Patient had not taken his vitals.  Over the phone, no acute distress.  No obvious shortness of breath.   Labs/Other Tests and Data Reviewed:    Recent Labs: 01/18/2018: ALT 33; TSH 2.610   Wt Readings from Last 3 Encounters:  10/12/18 203 lb (92.1 kg)  07/18/18 190 lb 9.6 oz (86.5 kg)  04/06/18 194 lb 3.2 oz (88.1 kg)     Other studies personally reviewed: Additional studies/ records that were reviewed today include: ECG 10/*29/19 personally reviewed  Review of the above records today demonstrates:  Atrial paced, first-degree AV block, rate 77  Last device remote is reviewed from [place] PDF dated 12/18/18 which reveals normal device function, no arrhythmias    ASSESSMENT & PLAN:    1.  Junctional rhythm: Status post Medtronic dual-chamber ICD implanted 06/11/2016.  Device functioning appropriately on most recent device check.  No changes.  2.  Paroxysmal atrial fibrillation: None on most recent device interrogation.  Continue  Coumadin.  [facility] vascular 3.  3.  PVCs: Currently on mexiletine.  Burden greatly reduced.  He is having some heartburn when he takes the mexiletine which occurs mainly in the evenings when he is laying down.  I have asked him to take his mexiletine approximately an hour prior to going to bed.  He Wyoma Genson call us back if this does not change his symptoms.  4.  Status post mitral valve replacement: Stable on most recent echo.  5.  Nonischemic cardiomyopathy: Status post Medtronic ICD on optimal medical therapy.  No obvious volume overload.   COVID 19 screen The patient denies symptoms of COVID 19 at this time.  The importance of social distancing was discussed today.  Follow-up: 6 months shoot Next remote: 03/19/19  Current medicines are reviewed at length with the patient today.   The patient does not have concerns regarding his medicines.  The following changes were made today:  none  Labs/ tests ordered today include:  No  orders of the defined types were placed in this encounter.  An attempt was made for a video visit. The patient was not able to connect and thus the visit was done with a telephone visit.  Patient Risk:  after full review of this patients clinical status, I feel that they are at moderate risk at this time.  Today, I have spent 13 minutes with the patient with telehealth technology discussing CHF, PVCs, heart burn.    Signed, Cynda Soule Meredith Leeds, MD  12/28/2018 11:40 AM     [facility] [address] [place] [address] [phone] (office) [phone] (fax)

## 2019-01-12 DIAGNOSIS — E876 Hypokalemia: Secondary | ICD-10-CM | POA: Diagnosis not present

## 2019-01-12 DIAGNOSIS — K219 Gastro-esophageal reflux disease without esophagitis: Secondary | ICD-10-CM | POA: Diagnosis not present

## 2019-01-12 DIAGNOSIS — I498 Other specified cardiac arrhythmias: Secondary | ICD-10-CM | POA: Diagnosis not present

## 2019-01-12 DIAGNOSIS — M109 Gout, unspecified: Secondary | ICD-10-CM | POA: Diagnosis not present

## 2019-01-12 DIAGNOSIS — E118 Type 2 diabetes mellitus with unspecified complications: Secondary | ICD-10-CM | POA: Diagnosis not present

## 2019-01-12 DIAGNOSIS — J309 Allergic rhinitis, unspecified: Secondary | ICD-10-CM | POA: Diagnosis not present

## 2019-01-12 DIAGNOSIS — I739 Peripheral vascular disease, unspecified: Secondary | ICD-10-CM | POA: Diagnosis not present

## 2019-01-12 DIAGNOSIS — E7800 Pure hypercholesterolemia, unspecified: Secondary | ICD-10-CM | POA: Diagnosis not present

## 2019-01-12 DIAGNOSIS — N183 Chronic kidney disease, stage 3 (moderate): Secondary | ICD-10-CM | POA: Diagnosis not present

## 2019-01-12 DIAGNOSIS — N529 Male erectile dysfunction, unspecified: Secondary | ICD-10-CM | POA: Diagnosis not present

## 2019-01-12 DIAGNOSIS — I10 Essential (primary) hypertension: Secondary | ICD-10-CM | POA: Diagnosis not present

## 2019-01-12 DIAGNOSIS — M159 Polyosteoarthritis, unspecified: Secondary | ICD-10-CM | POA: Diagnosis not present

## 2019-01-19 DIAGNOSIS — M25562 Pain in left knee: Secondary | ICD-10-CM | POA: Diagnosis not present

## 2019-01-19 DIAGNOSIS — I739 Peripheral vascular disease, unspecified: Secondary | ICD-10-CM | POA: Diagnosis not present

## 2019-01-19 DIAGNOSIS — I498 Other specified cardiac arrhythmias: Secondary | ICD-10-CM | POA: Diagnosis not present

## 2019-01-19 DIAGNOSIS — N183 Chronic kidney disease, stage 3 (moderate): Secondary | ICD-10-CM | POA: Diagnosis not present

## 2019-01-19 DIAGNOSIS — K219 Gastro-esophageal reflux disease without esophagitis: Secondary | ICD-10-CM | POA: Diagnosis not present

## 2019-01-19 DIAGNOSIS — Z1331 Encounter for screening for depression: Secondary | ICD-10-CM | POA: Diagnosis not present

## 2019-01-19 DIAGNOSIS — N529 Male erectile dysfunction, unspecified: Secondary | ICD-10-CM | POA: Diagnosis not present

## 2019-01-19 DIAGNOSIS — J309 Allergic rhinitis, unspecified: Secondary | ICD-10-CM | POA: Diagnosis not present

## 2019-01-19 DIAGNOSIS — M159 Polyosteoarthritis, unspecified: Secondary | ICD-10-CM | POA: Diagnosis not present

## 2019-01-22 ENCOUNTER — Other Ambulatory Visit: Payer: Self-pay | Admitting: Cardiology

## 2019-01-22 MED ORDER — MEXILETINE HCL 250 MG PO CAPS: 250.0000 mg | ORAL_CAPSULE | Freq: Two times a day (BID) | ORAL | 11 refills | Status: DC

## 2019-02-02 DIAGNOSIS — J309 Allergic rhinitis, unspecified: Secondary | ICD-10-CM | POA: Diagnosis not present

## 2019-02-02 DIAGNOSIS — M25561 Pain in right knee: Secondary | ICD-10-CM | POA: Diagnosis not present

## 2019-02-02 DIAGNOSIS — N529 Male erectile dysfunction, unspecified: Secondary | ICD-10-CM | POA: Diagnosis not present

## 2019-02-02 DIAGNOSIS — Z1331 Encounter for screening for depression: Secondary | ICD-10-CM | POA: Diagnosis not present

## 2019-02-02 DIAGNOSIS — M159 Polyosteoarthritis, unspecified: Secondary | ICD-10-CM | POA: Diagnosis not present

## 2019-02-02 DIAGNOSIS — K219 Gastro-esophageal reflux disease without esophagitis: Secondary | ICD-10-CM | POA: Diagnosis not present

## 2019-02-02 DIAGNOSIS — I798 Other disorders of arteries, arterioles and capillaries in diseases classified elsewhere: Secondary | ICD-10-CM | POA: Diagnosis not present

## 2019-02-02 DIAGNOSIS — N183 Chronic kidney disease, stage 3 (moderate): Secondary | ICD-10-CM | POA: Diagnosis not present

## 2019-02-02 DIAGNOSIS — Z7901 Long term (current) use of anticoagulants: Secondary | ICD-10-CM | POA: Diagnosis not present

## 2019-02-07 ENCOUNTER — Other Ambulatory Visit: Payer: Self-pay | Admitting: Cardiology

## 2019-02-07 MED ORDER — VALSARTAN 80 MG PO TABS: 80.0000 mg | ORAL_TABLET | Freq: Every day | ORAL | 3 refills | Status: DC

## 2019-02-13 DIAGNOSIS — E785 Hyperlipidemia, unspecified: Secondary | ICD-10-CM | POA: Diagnosis not present

## 2019-02-13 DIAGNOSIS — Z1331 Encounter for screening for depression: Secondary | ICD-10-CM | POA: Diagnosis not present

## 2019-02-13 DIAGNOSIS — Z9181 History of falling: Secondary | ICD-10-CM | POA: Diagnosis not present

## 2019-02-13 DIAGNOSIS — Z0000 Encounter for general adult medical examination without abnormal findings: Secondary | ICD-10-CM | POA: Diagnosis not present

## 2019-02-13 DIAGNOSIS — Z125 Encounter for screening for malignant neoplasm of prostate: Secondary | ICD-10-CM | POA: Diagnosis not present

## 2019-02-23 DIAGNOSIS — N183 Chronic kidney disease, stage 3 (moderate): Secondary | ICD-10-CM | POA: Diagnosis not present

## 2019-02-23 DIAGNOSIS — M25562 Pain in left knee: Secondary | ICD-10-CM | POA: Diagnosis not present

## 2019-02-23 DIAGNOSIS — I498 Other specified cardiac arrhythmias: Secondary | ICD-10-CM | POA: Diagnosis not present

## 2019-02-23 DIAGNOSIS — N529 Male erectile dysfunction, unspecified: Secondary | ICD-10-CM | POA: Diagnosis not present

## 2019-02-23 DIAGNOSIS — J309 Allergic rhinitis, unspecified: Secondary | ICD-10-CM | POA: Diagnosis not present

## 2019-02-23 DIAGNOSIS — K219 Gastro-esophageal reflux disease without esophagitis: Secondary | ICD-10-CM | POA: Diagnosis not present

## 2019-02-23 DIAGNOSIS — I739 Peripheral vascular disease, unspecified: Secondary | ICD-10-CM | POA: Diagnosis not present

## 2019-02-23 DIAGNOSIS — Z7901 Long term (current) use of anticoagulants: Secondary | ICD-10-CM | POA: Diagnosis not present

## 2019-02-23 DIAGNOSIS — M159 Polyosteoarthritis, unspecified: Secondary | ICD-10-CM | POA: Diagnosis not present

## 2019-03-01 DIAGNOSIS — I739 Peripheral vascular disease, unspecified: Secondary | ICD-10-CM | POA: Diagnosis not present

## 2019-03-01 DIAGNOSIS — M25562 Pain in left knee: Secondary | ICD-10-CM | POA: Diagnosis not present

## 2019-03-01 DIAGNOSIS — N183 Chronic kidney disease, stage 3 (moderate): Secondary | ICD-10-CM | POA: Diagnosis not present

## 2019-03-01 DIAGNOSIS — M159 Polyosteoarthritis, unspecified: Secondary | ICD-10-CM | POA: Diagnosis not present

## 2019-03-01 DIAGNOSIS — I498 Other specified cardiac arrhythmias: Secondary | ICD-10-CM | POA: Diagnosis not present

## 2019-03-01 DIAGNOSIS — N529 Male erectile dysfunction, unspecified: Secondary | ICD-10-CM | POA: Diagnosis not present

## 2019-03-01 DIAGNOSIS — J309 Allergic rhinitis, unspecified: Secondary | ICD-10-CM | POA: Diagnosis not present

## 2019-03-01 DIAGNOSIS — E876 Hypokalemia: Secondary | ICD-10-CM | POA: Diagnosis not present

## 2019-03-01 DIAGNOSIS — M109 Gout, unspecified: Secondary | ICD-10-CM | POA: Diagnosis not present

## 2019-03-15 DIAGNOSIS — K219 Gastro-esophageal reflux disease without esophagitis: Secondary | ICD-10-CM | POA: Diagnosis not present

## 2019-03-15 DIAGNOSIS — N183 Chronic kidney disease, stage 3 (moderate): Secondary | ICD-10-CM | POA: Diagnosis not present

## 2019-03-15 DIAGNOSIS — J309 Allergic rhinitis, unspecified: Secondary | ICD-10-CM | POA: Diagnosis not present

## 2019-03-15 DIAGNOSIS — I498 Other specified cardiac arrhythmias: Secondary | ICD-10-CM | POA: Diagnosis not present

## 2019-03-15 DIAGNOSIS — E876 Hypokalemia: Secondary | ICD-10-CM | POA: Diagnosis not present

## 2019-03-15 DIAGNOSIS — I739 Peripheral vascular disease, unspecified: Secondary | ICD-10-CM | POA: Diagnosis not present

## 2019-03-15 DIAGNOSIS — M159 Polyosteoarthritis, unspecified: Secondary | ICD-10-CM | POA: Diagnosis not present

## 2019-03-15 DIAGNOSIS — N529 Male erectile dysfunction, unspecified: Secondary | ICD-10-CM | POA: Diagnosis not present

## 2019-03-15 DIAGNOSIS — R791 Abnormal coagulation profile: Secondary | ICD-10-CM | POA: Diagnosis not present

## 2019-03-19 ENCOUNTER — Ambulatory Visit (INDEPENDENT_AMBULATORY_CARE_PROVIDER_SITE_OTHER): Payer: Medicare HMO | Admitting: *Deleted

## 2019-03-19 DIAGNOSIS — I428 Other cardiomyopathies: Secondary | ICD-10-CM

## 2019-03-19 DIAGNOSIS — I5022 Chronic systolic (congestive) heart failure: Secondary | ICD-10-CM

## 2019-03-20 LAB — CUP PACEART REMOTE DEVICE CHECK
Battery Remaining Longevity: 88 mo
Battery Voltage: 3 V
Brady Statistic AP VP Percent: 0.03 %
Brady Statistic AP VS Percent: 32.78 %
Brady Statistic AS VP Percent: 0.05 %
Brady Statistic AS VS Percent: 67.14 %
Brady Statistic RA Percent Paced: 32.76 %
Brady Statistic RV Percent Paced: 0.08 %
Date Time Interrogation Session: 20200629073623
HighPow Impedance: 56 Ohm
Implantable Lead Implant Date: 20170922
Implantable Lead Implant Date: 20170922
Implantable Lead Location: 753859
Implantable Lead Location: 753860
Implantable Lead Model: 5076
Implantable Pulse Generator Implant Date: 20170922
Lead Channel Impedance Value: 361 Ohm
Lead Channel Impedance Value: 399 Ohm
Lead Channel Impedance Value: 418 Ohm
Lead Channel Pacing Threshold Amplitude: 0.5 V
Lead Channel Pacing Threshold Amplitude: 0.75 V
Lead Channel Pacing Threshold Pulse Width: 0.4 ms
Lead Channel Pacing Threshold Pulse Width: 0.4 ms
Lead Channel Sensing Intrinsic Amplitude: 1 mV
Lead Channel Sensing Intrinsic Amplitude: 1 mV
Lead Channel Sensing Intrinsic Amplitude: 11.5 mV
Lead Channel Sensing Intrinsic Amplitude: 11.5 mV
Lead Channel Setting Pacing Amplitude: 2 V
Lead Channel Setting Pacing Amplitude: 2.5 V
Lead Channel Setting Pacing Pulse Width: 0.4 ms
Lead Channel Setting Sensing Sensitivity: 0.3 mV

## 2019-03-21 DIAGNOSIS — Z7901 Long term (current) use of anticoagulants: Secondary | ICD-10-CM | POA: Diagnosis not present

## 2019-03-21 DIAGNOSIS — K219 Gastro-esophageal reflux disease without esophagitis: Secondary | ICD-10-CM | POA: Diagnosis not present

## 2019-03-21 DIAGNOSIS — N183 Chronic kidney disease, stage 3 (moderate): Secondary | ICD-10-CM | POA: Diagnosis not present

## 2019-03-21 DIAGNOSIS — N529 Male erectile dysfunction, unspecified: Secondary | ICD-10-CM | POA: Diagnosis not present

## 2019-03-21 DIAGNOSIS — I498 Other specified cardiac arrhythmias: Secondary | ICD-10-CM | POA: Diagnosis not present

## 2019-03-21 DIAGNOSIS — E876 Hypokalemia: Secondary | ICD-10-CM | POA: Diagnosis not present

## 2019-03-21 DIAGNOSIS — I739 Peripheral vascular disease, unspecified: Secondary | ICD-10-CM | POA: Diagnosis not present

## 2019-03-21 DIAGNOSIS — M159 Polyosteoarthritis, unspecified: Secondary | ICD-10-CM | POA: Diagnosis not present

## 2019-03-21 DIAGNOSIS — J309 Allergic rhinitis, unspecified: Secondary | ICD-10-CM | POA: Diagnosis not present

## 2019-03-31 ENCOUNTER — Encounter: Payer: Self-pay | Admitting: Cardiology

## 2019-03-31 NOTE — Progress Notes (Signed)
Remote ICD transmission.   

## 2019-04-03 ENCOUNTER — Other Ambulatory Visit: Payer: Self-pay

## 2019-04-03 DIAGNOSIS — I779 Disorder of arteries and arterioles, unspecified: Secondary | ICD-10-CM

## 2019-04-12 ENCOUNTER — Ambulatory Visit (HOSPITAL_COMMUNITY)
Admission: RE | Admit: 2019-04-12 | Discharge: 2019-04-12 | Disposition: A | Discharge status: Home / Self Care | Payer: Medicare HMO | Source: Ambulatory Visit | Attending: Family | Admitting: Family

## 2019-04-12 ENCOUNTER — Ambulatory Visit (INDEPENDENT_AMBULATORY_CARE_PROVIDER_SITE_OTHER): Payer: Medicare HMO | Admitting: Family

## 2019-04-12 ENCOUNTER — Other Ambulatory Visit: Payer: Self-pay

## 2019-04-12 ENCOUNTER — Encounter: Payer: Self-pay | Admitting: Family

## 2019-04-12 VITALS — BP 131/77 | HR 73 | Temp 97.8°F | Resp 20 | Ht 72.0 in | Wt 190.0 lb

## 2019-04-12 DIAGNOSIS — I779 Disorder of arteries and arterioles, unspecified: Secondary | ICD-10-CM

## 2019-04-12 DIAGNOSIS — I70201 Unspecified atherosclerosis of native arteries of extremities, right leg: Secondary | ICD-10-CM | POA: Diagnosis not present

## 2019-04-12 NOTE — Progress Notes (Signed)
[facility] [place]   CC: Follow up peripheral artery occlusive disease  History of Present Illness Ryan [name] is a 73 y.o. male whom Dr. Trula Slade met in the hospital.  He was originally referred for possible right leg ischemia. 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 He also had infected hardware removed from his right foot in December 2013. He developed a wound on his right foot. He underwent angiography which revealed an occluded right popliteal artery. We had been managing his wound nonoperatively.   He was visiting the wound center frequently and has since been discharged from their care.  He denies non healing wounds in feet or legs, the right toe ulcer has healed.  He is able to ambulate without difficulty although he does use a cane around the house occasionally.   He was exercising at a gym2x/week, golfs once/month, he admits not walking as much lately.   His walking is limited by bilateral knee pain more so than claudication, states he gets shots in his knees by his PCP to help this arthritis pain.   Pt denies any history of stroke or TIA.   His PCPisDr. Truman Hayward.  Serum creatinine was 1.35 on 01-20-17, 52 GFR.   Diabetic: 6.0 A1C on 03/04/16, pt does not recallwhat most recent A1C was. Tobacco use: non-smoker  Pt meds include: Statin :Yes Betablocker: Yes ASA: No Other anticoagulants/antiplatelets: coumadin, history of atrial fib,s/p mitral valve replacement   Past Medical History:  Diagnosis Date  . Atrial fibrillation ([place])   . 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 Diabetes mellitus without complication ([place])   . Endocarditis   . Hypertension   . Peripheral vascular disease ([place])   . PVC's (premature ventricular contractions)   . SVT (supraventricular tachycardia)  long RP     Social History Social History   Tobacco Use  . Smoking status: Never Smoker  .  Smokeless tobacco: Never Used  Substance Use Topics  . Alcohol use: No    Alcohol/week: 0.0 standard drinks  . Drug use: No    Family History Family History  Problem Relation Age of Onset  . Hypertension Mother   . Hypertension Father     Past Surgical History:  Procedure Laterality Date  . ABDOMINAL AORTAGRAM N/A 10/03/2012   Procedure: ABDOMINAL Maxcine Ham;  Surgeon: [name], MD;  Location: [facility] CATH LAB;  Service: Cardiovascular;  Laterality: N/A;  . CORONARY ANGIOGRAM  09/21/2012   Procedure: CORONARY ANGIOGRAM;  Surgeon: [name], MD;  Location: [facility] CATH LAB;  Service: Cardiovascular;;  . EP IMPLANTABLE DEVICE N/A 06/11/2016   Procedure: ICD Implant;  Surgeon: Will Meredith Leeds, MD;  Location: [place] CV LAB;  Service: Cardiovascular;  Laterality: N/A;  . EXTREMITY WIRE/PIN REMOVAL  09/14/2012   Procedure: REMOVAL K-WIRE/PIN EXTREMITY;  Surgeon: [name], MD;  Location: [place];  Service: Orthopedics;  Laterality: Right;  Right Foot  . I&D EXTREMITY  09/14/2012   Procedure: IRRIGATION AND DEBRIDEMENT EXTREMITY;  Surgeon: Tennis Must, MD;  Location: [place];  Service: Orthopedics;  Laterality: Right;  . INTRAOPERATIVE TRANSESOPHAGEAL ECHOCARDIOGRAM  09/26/2012   Procedure: INTRAOPERATIVE TRANSESOPHAGEAL ECHOCARDIOGRAM;  Surgeon: [name], MD;  Location: [facility] OR;  Service: Open Heart Surgery;  Laterality: N/A;  . MITRAL VALVE REPLACEMENT  09/26/2012   Procedure: MITRAL VALVE (MV) REPLACEMENT;  Surgeon: [name], MD;  Location: [place] OR;  Service: Open Heart Surgery;  Laterality: N/A;  . RIGHT HEART CATHETERIZATION  09/21/2012   Procedure: RIGHT HEART CATH;  Surgeon: [name], MD;  Location: [facility] CATH LAB;  Service: Cardiovascular;;  . TEE WITHOUT CARDIOVERSION  09/18/2012   Procedure: TRANSESOPHAGEAL ECHOCARDIOGRAM (TEE);  Surgeon: [name], MD;  Location: [facility] ENDOSCOPY;  Service: Cardiovascular;  Laterality: N/A;    Allergies  Allergen Reactions   . Geralyn Flash [Fish Allergy] Nausea And Vomiting    Current Outpatient Medications  Medication Sig Dispense Refill  . atorvastatin (LIPITOR) 40 MG tablet Take 1 tablet (40 mg total) by mouth daily at 6 PM. 30 tablet 5  . HYDROcodone-acetaminophen (NORCO/VICODIN) 5-325 MG per tablet Take 1 tablet by mouth 2 (two) times daily as needed for moderate pain.     Marland Kitchen losartan-hydrochlorothiazide (HYZAAR) 100-25 MG tablet Take 1 tablet by mouth daily.  12  . metoprolol succinate (TOPROL-XL) 25 MG 24 hr tablet TAKE 1 TABLET DAILY 30 tablet 9  . mexiletine (MEXITIL) 250 MG capsule Take 1 capsule (250 mg total) by mouth 2 (two) times daily. 60 capsule 11  . pantoprazole (PROTONIX) 40 MG tablet Take 40 mg by mouth daily.     . potassium chloride SA (K-DUR,KLOR-CON) 20 MEQ tablet Take 20 mEq by mouth daily.     . sildenafil (VIAGRA) 100 MG tablet Take 1 tablet (100 mg total) by mouth as needed for erectile dysfunction. 10 tablet 0  . tamsulosin (FLOMAX) 0.4 MG CAPS capsule Take 1 capsule by mouth daily.    Marland Kitchen triamcinolone cream (KENALOG) 0.5 % Apply 1 application topically as needed (SKIN).     . valsartan (DIOVAN) 80 MG tablet Take 1 tablet (80 mg total) by mouth daily. 90 tablet 3  . warfarin (COUMADIN) 4 MG tablet Take 4 mg by mouth daily.     No current facility-administered medications for this visit.     ROS: See HPI for pertinent positives and negatives.   Physical Examination  Vitals:   04/12/19 1429  BP: 131/77  Pulse: 73  Resp: 20  Temp: 97.8 F (36.6 C)  SpO2: 97%  Weight: 190 lb (86.2 kg)  Height: 6' (1.829 m)   Body mass index is 25.77 kg/m.   General: A&O x 3, WDWN, male. Gait: steady, using cane HEENT: No gross abnormalities.  Pulmonary: Respirations are non labored, CTAB, good air movement in all fields Cardiac: regular rhythm, no detected murmur. Defibrillator palpated left upper chest.      Carotid Bruits Right Left   Negative Negative   Radial pulses are palpable  bilaterally   Adominal aortic pulse is not palpable                         VASCULAR EXAM: Extremities without ischemic changes, without Gangrene; without open wounds. Thick toenails, trimmed by nail salon                                                                                                           LE Pulses Right Left  FEMORAL  3+ palpable  1+ palpable        POPLITEAL  2+ palpable   not palpable       POSTERIOR TIBIAL  not palpable   not palpable        DORSALIS PEDIS      ANTERIOR TIBIAL not palpable  not palpable    Abdomen: soft, NT, no palpable masses. Skin: no rashes, no cellulitis, no ulcers noted. Musculoskeletal: no muscle wasting or atrophy.  Neurologic: A&O X 3; appropriate affect, Sensation is normal; MOTOR FUNCTION:  moving all extremities equally, motor strength 5/5 throughout. Speech is fluent/normal. CN 2-12 intact. Psychiatric: Thought content is normal, mood appropriate for clinical situation.   DATA  ABI (Date: 04/12/2019): ABI Findings: +---------+------------------+-----+----------+--------+ Right    Rt Pressure (mmHg)IndexWaveform  Comment  +---------+------------------+-----+----------+--------+ Brachial 156                                       +---------+------------------+-----+----------+--------+ PTA      79                0.49 biphasic (improved from mono) +---------+------------------+-----+----------+--------+ DP       157               0.98 monophasic         +---------+------------------+-----+----------+--------+ Great Toe77                0.48                    +---------+------------------+-----+----------+--------+  +--------+------------------+-----+----------+-------+ Left    Lt Pressure (mmHg)IndexWaveform  Comment +--------+------------------+-----+----------+-------+ Brachial160                                       +--------+------------------+-----+----------+-------+ PTA                            monophasic        +--------+------------------+-----+----------+-------+ DP                             biphasic(improved from mono) +--------+------------------+-----+----------+-------+  +-------+-----------+-----------+------------+------------+ ABI/TBIToday's ABIToday's TBIPrevious ABIPrevious TBI +-------+-----------+-----------+------------+------------+ Right  0.98       0.48       0.77        0.44         +-------+-----------+-----------+------------+------------+ Left   non-com    0.61       0.86        0.64         +-------+-----------+-----------+------------+------------+  Arterial wall calcification precludes accurate ankle pressures and ABIs.   Summary: Right: Resting right ankle-brachial index indicates noncompressible right lower extremity arteries.The right toe-brachial index is abnormal. Left: Resting left ankle-brachial index indicates noncompressible left lower extremity arteries.The left toe-brachial index is abnormal.    ASSESSMENT: Ryan [name] is a 73 y.o. male whose walking is now more limited by bilateral OA knee pain than claudication.  Angiography in 2013 revealed an occluded right popliteal artery. There are no signs of ischemia in his feet or legs.  He remains active.  ABI's indicate non compressible vessels bilaterally,  But the quality of the waveforms have improved.   Right toe ulcer has completely healed, no signs of ischemia in his  feet or legs.  He takes a statin and coumadin. He has a hx of history of atrial fib and iss/p mitral valve replacement. Fortunately he has never usedtobacco.   PLAN:  Based on the patient's vascular studies and examination, pt will return to clinic in 6 months with ABI's.  I advised him to notify us if he develops concerns re the circulation in his feet or legs.  Twice daily seated leg  exercises dicussed and demonstrated   Referral to podiatrist in [place] for ongoing evaluation of feet in pt with PAD and DM, thick toenails.   I discussed in depth with the patient the nature of atherosclerosis, and emphasized the importance of maximal medical management including strict control of blood pressure, blood glucose, and lipid levels, obtaining regular exercise, and continued cessation of smoking.  The patient is aware that without maximal medical management the underlying atherosclerotic disease process will progress, limiting the benefit of any interventions.  The patient was given information about PAD including signs, symptoms, treatment, what symptoms should prompt the patient to seek immediate medical care, and risk reduction measures to take.  Clemon Chambers, RN, MSN, FNP-C [facility] Phone: [phone]  Clinic MD: Scot Dock  04/12/19 3:07 PM

## 2019-04-12 NOTE — Patient Instructions (Signed)
Peripheral Vascular Disease  Peripheral vascular disease (PVD) is a disease of the blood vessels that are not part of your heart and brain. A simple term for PVD is poor circulation. In most cases, PVD narrows the blood vessels that carry blood from your heart to the rest of your body. This can reduce the supply of blood to your arms, legs, and internal organs, like your stomach or kidneys. However, PVD most often affects a person's lower legs and feet. Without treatment, PVD tends to get worse. PVD can also lead to acute ischemic limb. This is when an arm or leg suddenly cannot get enough blood. This is a medical emergency. Follow these instructions at home: Lifestyle  Do not use any products that contain nicotine or tobacco, such as cigarettes and e-cigarettes. If you need help quitting, ask your doctor.  Lose weight if you are overweight. Or, stay at a healthy weight as told by your doctor.  Eat a diet that is low in fat and cholesterol. If you need help, ask your doctor.  Exercise regularly. Ask your doctor for activities that are right for you. General instructions  Take over-the-counter and prescription medicines only as told by your doctor.  Take good care of your feet: ? Wear comfortable shoes that fit well. ? Check your feet often for any cuts or sores.  Keep all follow-up visits as told by your doctor This is important. Contact a doctor if:  You have cramps in your legs when you walk.  You have leg pain when you are at rest.  You have coldness in a leg or foot.  Your skin changes.  You are unable to get or have an erection (erectile dysfunction).  You have cuts or sores on your feet that do not heal. Get help right away if:  Your arm or leg turns cold, numb, and blue.  Your arms or legs become red, warm, swollen, painful, or numb.  You have chest pain.  You have trouble breathing.  You suddenly have weakness in your face, arm, or leg.  You become very  confused or you cannot speak.  You suddenly have a very bad headache.  You suddenly cannot see. Summary  Peripheral vascular disease (PVD) is a disease of the blood vessels.  A simple term for PVD is poor circulation. Without treatment, PVD tends to get worse.  Treatment may include exercise, low fat and low cholesterol diet, and quitting smoking. This information is not intended to replace advice given to you by your health care provider. Make sure you discuss any questions you have with your health care provider. Document Released: 12/01/2009 Document Revised: 08/19/2017 Document Reviewed: 10/14/2016 Elsevier Patient Education  2020 [employer]  

## 2019-04-23 DIAGNOSIS — M159 Polyosteoarthritis, unspecified: Secondary | ICD-10-CM | POA: Diagnosis not present

## 2019-04-23 DIAGNOSIS — I498 Other specified cardiac arrhythmias: Secondary | ICD-10-CM | POA: Diagnosis not present

## 2019-04-23 DIAGNOSIS — I10 Essential (primary) hypertension: Secondary | ICD-10-CM | POA: Diagnosis not present

## 2019-04-23 DIAGNOSIS — E118 Type 2 diabetes mellitus with unspecified complications: Secondary | ICD-10-CM | POA: Diagnosis not present

## 2019-04-23 DIAGNOSIS — E876 Hypokalemia: Secondary | ICD-10-CM | POA: Diagnosis not present

## 2019-04-23 DIAGNOSIS — N183 Chronic kidney disease, stage 3 (moderate): Secondary | ICD-10-CM | POA: Diagnosis not present

## 2019-04-23 DIAGNOSIS — K219 Gastro-esophageal reflux disease without esophagitis: Secondary | ICD-10-CM | POA: Diagnosis not present

## 2019-04-23 DIAGNOSIS — R791 Abnormal coagulation profile: Secondary | ICD-10-CM | POA: Diagnosis not present

## 2019-04-23 DIAGNOSIS — E7800 Pure hypercholesterolemia, unspecified: Secondary | ICD-10-CM | POA: Diagnosis not present

## 2019-04-23 DIAGNOSIS — N529 Male erectile dysfunction, unspecified: Secondary | ICD-10-CM | POA: Diagnosis not present

## 2019-04-23 DIAGNOSIS — J309 Allergic rhinitis, unspecified: Secondary | ICD-10-CM | POA: Diagnosis not present

## 2019-04-23 DIAGNOSIS — I739 Peripheral vascular disease, unspecified: Secondary | ICD-10-CM | POA: Diagnosis not present

## 2019-04-30 DIAGNOSIS — J309 Allergic rhinitis, unspecified: Secondary | ICD-10-CM | POA: Diagnosis not present

## 2019-04-30 DIAGNOSIS — K219 Gastro-esophageal reflux disease without esophagitis: Secondary | ICD-10-CM | POA: Diagnosis not present

## 2019-04-30 DIAGNOSIS — N183 Chronic kidney disease, stage 3 (moderate): Secondary | ICD-10-CM | POA: Diagnosis not present

## 2019-04-30 DIAGNOSIS — I498 Other specified cardiac arrhythmias: Secondary | ICD-10-CM | POA: Diagnosis not present

## 2019-04-30 DIAGNOSIS — E876 Hypokalemia: Secondary | ICD-10-CM | POA: Diagnosis not present

## 2019-04-30 DIAGNOSIS — N529 Male erectile dysfunction, unspecified: Secondary | ICD-10-CM | POA: Diagnosis not present

## 2019-04-30 DIAGNOSIS — Z7901 Long term (current) use of anticoagulants: Secondary | ICD-10-CM | POA: Diagnosis not present

## 2019-04-30 DIAGNOSIS — I739 Peripheral vascular disease, unspecified: Secondary | ICD-10-CM | POA: Diagnosis not present

## 2019-04-30 DIAGNOSIS — M159 Polyosteoarthritis, unspecified: Secondary | ICD-10-CM | POA: Diagnosis not present

## 2019-05-21 ENCOUNTER — Other Ambulatory Visit: Payer: Self-pay | Admitting: Interventional Cardiology

## 2019-05-24 DIAGNOSIS — R1011 Right upper quadrant pain: Secondary | ICD-10-CM | POA: Diagnosis not present

## 2019-05-24 DIAGNOSIS — M159 Polyosteoarthritis, unspecified: Secondary | ICD-10-CM | POA: Diagnosis not present

## 2019-05-24 DIAGNOSIS — N529 Male erectile dysfunction, unspecified: Secondary | ICD-10-CM | POA: Diagnosis not present

## 2019-05-24 DIAGNOSIS — I739 Peripheral vascular disease, unspecified: Secondary | ICD-10-CM | POA: Diagnosis not present

## 2019-05-24 DIAGNOSIS — Z7901 Long term (current) use of anticoagulants: Secondary | ICD-10-CM | POA: Diagnosis not present

## 2019-05-24 DIAGNOSIS — J309 Allergic rhinitis, unspecified: Secondary | ICD-10-CM | POA: Diagnosis not present

## 2019-05-24 DIAGNOSIS — I498 Other specified cardiac arrhythmias: Secondary | ICD-10-CM | POA: Diagnosis not present

## 2019-05-24 DIAGNOSIS — N183 Chronic kidney disease, stage 3 (moderate): Secondary | ICD-10-CM | POA: Diagnosis not present

## 2019-05-24 DIAGNOSIS — K219 Gastro-esophageal reflux disease without esophagitis: Secondary | ICD-10-CM | POA: Diagnosis not present

## 2019-05-31 DIAGNOSIS — I739 Peripheral vascular disease, unspecified: Secondary | ICD-10-CM | POA: Diagnosis not present

## 2019-05-31 DIAGNOSIS — I498 Other specified cardiac arrhythmias: Secondary | ICD-10-CM | POA: Diagnosis not present

## 2019-05-31 DIAGNOSIS — N529 Male erectile dysfunction, unspecified: Secondary | ICD-10-CM | POA: Diagnosis not present

## 2019-05-31 DIAGNOSIS — N183 Chronic kidney disease, stage 3 (moderate): Secondary | ICD-10-CM | POA: Diagnosis not present

## 2019-05-31 DIAGNOSIS — M159 Polyosteoarthritis, unspecified: Secondary | ICD-10-CM | POA: Diagnosis not present

## 2019-05-31 DIAGNOSIS — J309 Allergic rhinitis, unspecified: Secondary | ICD-10-CM | POA: Diagnosis not present

## 2019-05-31 DIAGNOSIS — K8020 Calculus of gallbladder without cholecystitis without obstruction: Secondary | ICD-10-CM | POA: Diagnosis not present

## 2019-05-31 DIAGNOSIS — K219 Gastro-esophageal reflux disease without esophagitis: Secondary | ICD-10-CM | POA: Diagnosis not present

## 2019-05-31 DIAGNOSIS — M25561 Pain in right knee: Secondary | ICD-10-CM | POA: Diagnosis not present

## 2019-06-14 DIAGNOSIS — K219 Gastro-esophageal reflux disease without esophagitis: Secondary | ICD-10-CM | POA: Diagnosis not present

## 2019-06-14 DIAGNOSIS — N529 Male erectile dysfunction, unspecified: Secondary | ICD-10-CM | POA: Diagnosis not present

## 2019-06-14 DIAGNOSIS — K8020 Calculus of gallbladder without cholecystitis without obstruction: Secondary | ICD-10-CM | POA: Diagnosis not present

## 2019-06-14 DIAGNOSIS — I498 Other specified cardiac arrhythmias: Secondary | ICD-10-CM | POA: Diagnosis not present

## 2019-06-14 DIAGNOSIS — M159 Polyosteoarthritis, unspecified: Secondary | ICD-10-CM | POA: Diagnosis not present

## 2019-06-14 DIAGNOSIS — N183 Chronic kidney disease, stage 3 (moderate): Secondary | ICD-10-CM | POA: Diagnosis not present

## 2019-06-14 DIAGNOSIS — M25561 Pain in right knee: Secondary | ICD-10-CM | POA: Diagnosis not present

## 2019-06-14 DIAGNOSIS — J309 Allergic rhinitis, unspecified: Secondary | ICD-10-CM | POA: Diagnosis not present

## 2019-06-14 DIAGNOSIS — Z23 Encounter for immunization: Secondary | ICD-10-CM | POA: Diagnosis not present

## 2019-06-17 NOTE — Progress Notes (Signed)
Cardiology Office Note:    Date:  06/18/2019   ID:  Ryan [name], DOB 1946-01-19, MRN [id number]  PCP:  [name], MD  Cardiologist:  No primary care provider on file.   Referring MD: [name], MD   Chief Complaint  Patient presents with   Congestive Heart Failure   Atrial Fibrillation    History of Present Illness:    Ryan [name] is a 73 y.o. 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  He has no complaints today.  We discussed 21st Century management of systolic heart failure.  We discussed duplicative ARB therapy (losartan and valsartan).  We discussed switching to [facility] but he has great reluctance because of commercials he has heard which suggest a large side effect profile.  Orthopedic issues are preventing physical activity as vigorous as prior.  He denies orthopnea.  He does have occasional palpitations.  He has not had chest pain, ankle edema, or orthopnea.  Past Medical History:  Diagnosis Date   Atrial fibrillation (HCC)    Cardiomyopathy, dilated ([place])    Diabetes mellitus without complication ([place])    Endocarditis    Hypertension    Peripheral vascular disease (HCC)    PVC's (premature ventricular contractions)    SVT (supraventricular tachycardia)  long RP     Past Surgical History:  Procedure Laterality Date   ABDOMINAL AORTAGRAM N/A 10/03/2012   Procedure: ABDOMINAL Maxcine Ham;  Surgeon: [name], MD;  Location: [facility] CATH LAB;  Service: Cardiovascular;  Laterality: N/A;   CORONARY ANGIOGRAM  09/21/2012   Procedure: CORONARY ANGIOGRAM;  Surgeon: [name], MD;  Location: [facility] CATH LAB;  Service: Cardiovascular;;   EP IMPLANTABLE DEVICE N/A 06/11/2016   Procedure: ICD Implant;  Surgeon: Will Meredith Leeds, MD;  Location: [place] CV LAB;  Service: Cardiovascular;  Laterality: N/A;   EXTREMITY WIRE/PIN REMOVAL  09/14/2012   Procedure: REMOVAL K-WIRE/PIN EXTREMITY;  Surgeon: [name], MD;  Location: [place];  Service: Orthopedics;  Laterality: Right;  Right Foot   I&D EXTREMITY  09/14/2012   Procedure: IRRIGATION AND DEBRIDEMENT EXTREMITY;  Surgeon: Tennis Must, MD;  Location: [place];  Service: Orthopedics;  Laterality: Right;   INTRAOPERATIVE TRANSESOPHAGEAL ECHOCARDIOGRAM  09/26/2012   Procedure: INTRAOPERATIVE TRANSESOPHAGEAL ECHOCARDIOGRAM;  Surgeon: [name], MD;  Location: [facility] OR;  Service: Open Heart Surgery;  Laterality: N/A;   MITRAL VALVE REPLACEMENT  09/26/2012   Procedure: MITRAL VALVE (MV) REPLACEMENT;  Surgeon: [name], MD;  Location: [place] OR;  Service: Open Heart Surgery;  Laterality: N/A;   RIGHT HEART CATHETERIZATION  09/21/2012   Procedure: RIGHT HEART CATH;  Surgeon: [name], MD;  Location: [facility] CATH LAB;  Service: Cardiovascular;;   TEE WITHOUT CARDIOVERSION  09/18/2012   Procedure: TRANSESOPHAGEAL ECHOCARDIOGRAM (TEE);  Surgeon: [name], MD;  Location: [facility] ENDOSCOPY;  Service: Cardiovascular;  Laterality: N/A;    Current Medications: Current Meds  Medication Sig   atorvastatin (LIPITOR) 40 MG tablet Take 1 tablet (40 mg total) by mouth daily at 6 PM.   HYDROcodone-acetaminophen (NORCO/VICODIN) 5-325 MG per tablet Take 1 tablet by mouth 2 (two) times daily as needed for moderate pain.    losartan-hydrochlorothiazide (HYZAAR) 100-25 MG tablet Take 1 tablet by mouth daily.   metoprolol succinate (TOPROL-XL) 25 MG 24 hr tablet TAKE 1  TABLET DAILY   mexiletine (MEXITIL) 250 MG capsule Take 1 capsule (250 mg total) by mouth 2 (two) times daily.   Multiple Vitamins-Minerals (CENTRUM SILVER PO) Take 1 tablet by mouth 3 (three) times a week.   pantoprazole (PROTONIX) 40 MG tablet Take 40 mg by mouth daily.    potassium chloride SA  (K-DUR,KLOR-CON) 20 MEQ tablet Take 20 mEq by mouth daily.    tamsulosin (FLOMAX) 0.4 MG CAPS capsule Take 1 capsule by mouth daily.   triamcinolone cream (KENALOG) 0.5 % Apply 1 application topically as needed (SKIN).    valsartan (DIOVAN) 80 MG tablet Take 1 tablet (80 mg total) by mouth daily.   warfarin (COUMADIN) 4 MG tablet Take 4 mg by mouth daily.     Allergies:   [name] allergy]   Social History   Socioeconomic History   Marital status: Legally Separated    Spouse name: Not on file   Number of children: 3   Years of education: Not on file   Highest education level: Not on file  Occupational History   Not on file  Social Needs   Financial resource strain: Not on file   Food insecurity    Worry: Not on file    Inability: Not on file   Transportation needs    Medical: Not on file    Non-medical: Not on file  Tobacco Use   Smoking status: Never Smoker   Smokeless tobacco: Never Used  Substance and Sexual Activity   Alcohol use: No    Alcohol/week: 0.0 standard drinks   Drug use: No   Sexual activity: Not on file  Lifestyle   Physical activity    Days per week: Not on file    Minutes per session: Not on file   Stress: Not on file  Relationships   Social connections    Talks on phone: Not on file    Gets together: Not on file    Attends religious service: Not on file    Active member of club or organization: Not on file    Attends meetings of clubs or organizations: Not on file    Relationship status: Not on file  Other Topics Concern   Not on file  Social History Narrative   Daughter lives with him.  Retired Advertising account planner     Family History: The patient's family history includes Hypertension in his father and mother.  ROS:   Please see the history of present illness.    Recent right upper quadrant discomfort.  Etiology of complaint was not identified.  All other systems reviewed and are negative.  EKGs/Labs/Other Studies  Reviewed:    The following studies were reviewed today: The most recent 2D Doppler echocardiogram performed 04/2016: Study Conclusions  - Left ventricle: The cavity size was normal. Systolic function was   moderately to severely reduced. The estimated ejection fraction   was in the range of 30% to 35%. Diffuse hypokinesis, worse in the   septum. - Aortic valve: Transvalvular velocity was within the normal range.   There was no stenosis. There was no regurgitation. - Mitral valve: A bioprosthesis was present. Pressure half-time: 63   ms. Mean gradient (D): 12 mm Hg. - Left atrium: The atrium was moderately dilated. - Right ventricle: The cavity size was normal. Wall thickness was   normal. Systolic function was moderately reduced. - Tricuspid valve: There was no regurgitation. - Inferior vena cava: The vessel was normal in size. The   respirophasic diameter  changes were in the normal range (>= 50%),   consistent with normal central venous pressure.  Impressions:  - Mean gradient across the mitral valve is significantly increased   compared with 12/03/15. 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 Also, the TVI   ratio is 3.4, consistent with pathologic obstruction. 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 Consider repeating echo once tachycardia has   resolved.  EKG:  EKG not repeated  Recent Labs: No results found for requested labs within last 8760 hours.  Recent Lipid Panel    Component Value Date/Time   CHOL 121 09/16/2012 0520   TRIG 174 (H) 09/16/2012 0520   HDL 6 (L) 09/16/2012 0520   CHOLHDL 20.2 09/16/2012 0520   VLDL 35 09/16/2012 0520   LDLCALC 80 09/16/2012 0520    Physical Exam:    VS:  BP (!) 144/76    Pulse 88    Ht 6' (1.829 m)    Wt 185 lb 6.4 oz (84.1 kg)    SpO2 98%    BMI 25.14 kg/m      Wt Readings from Last 3 Encounters:  06/18/19 185 lb 6.4 oz (84.1 kg)  04/12/19 190 lb (86.2 kg)  10/12/18 203 lb (92.1 kg)     GEN: Slender and frail.. No acute distress HEENT: Normal NECK: No JVD. LYMPHATICS: No lymphadenopathy CARDIAC:  RRR without murmur, gallop, or edema. VASCULAR:  Normal Pulses. No bruits. RESPIRATORY:  Clear to auscultation without rales, wheezing or rhonchi  ABDOMEN: Soft, non-tender, non-distended, No pulsatile mass, MUSCULOSKELETAL: No deformity  SKIN: Warm and dry NEUROLOGIC:  Alert and oriented x 3 PSYCHIATRIC:  Normal affect   ASSESSMENT:    1. Status post mitral valve replacement with bioprosthetic valve   2. Chronic systolic heart failure ([place])   3. PAOD (peripheral arterial occlusive disease) (HCC)   4. Paroxysmal atrial fibrillation ([place])   5. Essential hypertension   6. On amiodarone therapy   7. ICD (implantable cardioverter-defibrillator) in place   8. Educated About Covid-19 Virus Infection    PLAN:    In order of problems listed above:  1. No mitral regurgitation murmur is heard. 2. No evidence of volume overload is noted.  In reviewing heart failure therapy, duplication of ARB therapy is noted with the patient taking both losartan and valsartan.  Losartan HCT is discontinued and valsartan HCT 160 mg / 25 mg is prescribed.  He should return in 1 week for be met and reassessment of blood pressure.  Further up titration of valsartan HCT may be necessary.  If blood pressure still elevated on next office visit, consider adding low-dose Aldactone and possibly discontinuing potassium supplementation.  We should repeat a wall motion study to get an up-to-date EF.  Last evaluation 2017.  If EF is still less than 35%, we should consider transitioning to [facility] and discontinuing valsartan. 3. Not addressed 4. Occasional palpitations may represent PAF 5. Blood pressure is elevated.  Target should be 130/80 mmHg.  Low-salt diet is  urged. 6. Amiodarone has been discontinued. 7. ICD is followed in device clinic. 8. Social distancing, handwashing, and mask wearing is emphasized.  He has been educated concerning updated heart failure therapy.  He understands advancements can be made but is still reluctant to choose Entresto, both economic and side effect/fear is contributing.  He will be followed up in 7 to 14 days with basic metabolic panel for the purpose of reassessment of blood pressure after the medication switch.  May need to consider discontinuation of potassium supplement depending upon labs.  May need to consider addition of spironolactone 12.5 mg/day depending upon the laboratory data.  This follow-up will be with me or a team member.   Medication Adjustments/Labs and Tests Ordered: Current medicines are reviewed at length with the patient today.  Concerns regarding medicines are outlined above.  No orders of the defined types were placed in this encounter.  No orders of the defined types were placed in this encounter.   There are no Patient Instructions on file for this visit.   Signed, [name], MD  06/18/2019 12:07 PM    [place]

## 2019-06-18 ENCOUNTER — Ambulatory Visit: Payer: Medicare HMO | Admitting: Interventional Cardiology

## 2019-06-18 ENCOUNTER — Ambulatory Visit (INDEPENDENT_AMBULATORY_CARE_PROVIDER_SITE_OTHER): Payer: Medicare HMO | Admitting: *Deleted

## 2019-06-18 ENCOUNTER — Encounter: Payer: Self-pay | Admitting: Interventional Cardiology

## 2019-06-18 ENCOUNTER — Other Ambulatory Visit: Payer: Self-pay

## 2019-06-18 VITALS — BP 144/76 | HR 88 | Ht 72.0 in | Wt 185.4 lb

## 2019-06-18 DIAGNOSIS — I480 Paroxysmal atrial fibrillation: Secondary | ICD-10-CM

## 2019-06-18 DIAGNOSIS — Z95810 Presence of automatic (implantable) cardiac defibrillator: Secondary | ICD-10-CM

## 2019-06-18 DIAGNOSIS — Z79899 Other long term (current) drug therapy: Secondary | ICD-10-CM | POA: Diagnosis not present

## 2019-06-18 DIAGNOSIS — Z7189 Other specified counseling: Secondary | ICD-10-CM | POA: Diagnosis not present

## 2019-06-18 DIAGNOSIS — I779 Disorder of arteries and arterioles, unspecified: Secondary | ICD-10-CM | POA: Diagnosis not present

## 2019-06-18 DIAGNOSIS — I5022 Chronic systolic (congestive) heart failure: Secondary | ICD-10-CM

## 2019-06-18 DIAGNOSIS — I10 Essential (primary) hypertension: Secondary | ICD-10-CM | POA: Diagnosis not present

## 2019-06-18 DIAGNOSIS — Z953 Presence of xenogenic heart valve: Secondary | ICD-10-CM

## 2019-06-18 DIAGNOSIS — I639 Cerebral infarction, unspecified: Secondary | ICD-10-CM

## 2019-06-18 LAB — CUP PACEART REMOTE DEVICE CHECK
Battery Remaining Longevity: 84 mo
Battery Voltage: 2.99 V
Brady Statistic AP VP Percent: 0.04 %
Brady Statistic AP VS Percent: 42.13 %
Brady Statistic AS VP Percent: 0.04 %
Brady Statistic AS VS Percent: 57.79 %
Brady Statistic RA Percent Paced: 41.93 %
Brady Statistic RV Percent Paced: 0.08 %
Date Time Interrogation Session: 20200928062605
HighPow Impedance: 68 Ohm
Implantable Lead Implant Date: 20170922
Implantable Lead Implant Date: 20170922
Implantable Lead Location: 753859
Implantable Lead Location: 753860
Implantable Lead Model: 5076
Implantable Pulse Generator Implant Date: 20170922
Lead Channel Impedance Value: 399 Ohm
Lead Channel Impedance Value: 418 Ohm
Lead Channel Impedance Value: 513 Ohm
Lead Channel Pacing Threshold Amplitude: 0.5 V
Lead Channel Pacing Threshold Amplitude: 0.75 V
Lead Channel Pacing Threshold Pulse Width: 0.4 ms
Lead Channel Pacing Threshold Pulse Width: 0.4 ms
Lead Channel Sensing Intrinsic Amplitude: 1 mV
Lead Channel Sensing Intrinsic Amplitude: 1 mV
Lead Channel Sensing Intrinsic Amplitude: 14.25 mV
Lead Channel Sensing Intrinsic Amplitude: 14.25 mV
Lead Channel Setting Pacing Amplitude: 2 V
Lead Channel Setting Pacing Amplitude: 2.5 V
Lead Channel Setting Pacing Pulse Width: 0.4 ms
Lead Channel Setting Sensing Sensitivity: 0.3 mV

## 2019-06-18 MED ORDER — VALSARTAN-HYDROCHLOROTHIAZIDE 160-25 MG PO TABS: 1.0000 | ORAL_TABLET | Freq: Every day | ORAL | 3 refills | Status: DC

## 2019-06-18 NOTE — Patient Instructions (Signed)
Medication Instructions:  1) DISCONTINUE Valsartan 80mg  2) DISCONTINUE Losartan/HCT 3) START Valsartan/HCTZ 160/25mg  once daily  If you need a refill on your cardiac medications before your next appointment, please call your pharmacy.   Lab work: BMET when you see PA or NP  If you have labs (blood work) drawn today and your tests are completely normal, you will receive your results only by: Marland Kitchen MyChart Message (if you have MyChart) OR . A paper copy in the mail If you have any lab test that is abnormal or we need to change your treatment, we will call you to review the results.  Testing/Procedures: None  Follow-Up: At [facility], you and your health needs are our priority.  As part of our continuing mission to provide you with exceptional heart care, we have created designated Provider Care Teams.  These Care Teams include your primary Cardiologist (physician) and Advanced Practice Providers (APPs -  Physician Assistants and Nurse Practitioners) who all work together to provide you with the care you need, when you need it. You will need a follow up appointment in 1-2 weeks.  Please call our office 2 months in advance to schedule this appointment.  You may see Dr. Daneen Schick or one of the following Advanced Practice Providers on your designated Care Team:   Truitt Merle, NP Cecilie Kicks, NP . Kathyrn Drown, NP  Any Other Special Instructions Will Be Listed Below (If Applicable).

## 2019-06-22 NOTE — Progress Notes (Signed)
CARDIOLOGY OFFICE NOTE  Date:  06/26/2019    Ryan [name] Date of Birth: 1946/08/19 Medical Record [id number]  PCP:  [name], MD  Cardiologist:  Starlyn Skeans   Chief Complaint  Patient presents with   Follow-up    History of Present Illness: Ryan [name] is a 73 y.o. male who presents today for a follow up visit. Seen for Dr. Tamala Julian. Sees Dr. [name] for EP.   He has a history of endocarditis, subsequent bioprosthetic MVR - Dr. Cyndia Bent, PAF/SVT, PVC's at high burden (20%)amiodarone--> [name] --> Mexilitine (Dr. 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  Seen last month by Dr. Tamala Julian - discussed guideline therapy for his heart failure - he was reluctant to make changes but was noted to be on 2 ARB's - this was adjusted. His orthopedic issues hinder his ability to be active. He was to have an echo - not completed yet.   The patient does not have symptoms concerning for COVID-19 infection (fever, chills, cough, or new shortness of breath).   Comes in today. Here alone. He seems to be doing ok. He has some shortness of breath. Does not sound like it is getting worse. His weight is going down. No chest pain. He is very unclear about his medicines - he keeps telling me that he stopped the Toprol and sounds like he is still on both the Losartan HCT and the Valsartan HCT (Valsartan HCT started last week). He is a little upset about having to keep coming back to the office and the copay involved. He does not check his BP at home and does not have scales - does try to stay away from salt.    Past Medical History:  Diagnosis Date   Atrial fibrillation (HCC)    Cardiomyopathy, dilated ([place])    Diabetes mellitus without complication ([place])    Endocarditis    Hypertension    Peripheral vascular disease (HCC)    PVC's (premature ventricular contractions)    SVT (supraventricular  tachycardia)  long RP     Past Surgical History:  Procedure Laterality Date   ABDOMINAL AORTAGRAM N/A 10/03/2012   Procedure: ABDOMINAL Maxcine Ham;  Surgeon: [name], MD;  Location: [facility] CATH LAB;  Service: Cardiovascular;  Laterality: N/A;   CORONARY ANGIOGRAM  09/21/2012   Procedure: CORONARY ANGIOGRAM;  Surgeon: [name], MD;  Location: [facility] CATH LAB;  Service: Cardiovascular;;   EP IMPLANTABLE DEVICE N/A 06/11/2016   Procedure: ICD Implant;  Surgeon: Will Meredith Leeds, MD;  Location: [place] CV LAB;  Service: Cardiovascular;  Laterality: N/A;   EXTREMITY WIRE/PIN REMOVAL  09/14/2012   Procedure: REMOVAL K-WIRE/PIN EXTREMITY;  Surgeon: [name], MD;  Location: [place];  Service: Orthopedics;  Laterality: Right;  Right Foot   I&D EXTREMITY  09/14/2012   Procedure: IRRIGATION AND DEBRIDEMENT EXTREMITY;  Surgeon: Tennis Must, MD;  Location: [place];  Service: Orthopedics;  Laterality: Right;   INTRAOPERATIVE TRANSESOPHAGEAL ECHOCARDIOGRAM  09/26/2012   Procedure: INTRAOPERATIVE TRANSESOPHAGEAL ECHOCARDIOGRAM;  Surgeon: [name], MD;  Location: [facility] OR;  Service: Open Heart Surgery;  Laterality: N/A;   MITRAL VALVE REPLACEMENT  09/26/2012   Procedure: MITRAL VALVE (MV) REPLACEMENT;  Surgeon: [name], MD;  Location: [place] OR;  Service: Open Heart Surgery;  Laterality: N/A;   RIGHT HEART CATHETERIZATION  09/21/2012   Procedure: RIGHT HEART CATH;  Surgeon: [name], MD;  Location: [place] CATH LAB;  Service: Cardiovascular;;   TEE WITHOUT CARDIOVERSION  09/18/2012   Procedure: TRANSESOPHAGEAL ECHOCARDIOGRAM (TEE);  Surgeon: [name], MD;  Location: [facility] ENDOSCOPY;  Service: Cardiovascular;  Laterality: N/A;     Medications: Current Meds  Medication Sig   atorvastatin (LIPITOR) 40 MG tablet Take 1 tablet (40 mg total) by mouth daily at 6 PM.   HYDROcodone-acetaminophen (NORCO/VICODIN) 5-325 MG per tablet Take 1 tablet by mouth 2 (two) times daily as needed  for moderate pain.    metoprolol succinate (TOPROL-XL) 25 MG 24 hr tablet TAKE 1 TABLET DAILY   mexiletine (MEXITIL) 250 MG capsule Take 1 capsule (250 mg total) by mouth 2 (two) times daily.   Multiple Vitamins-Minerals (CENTRUM SILVER PO) Take 1 tablet by mouth 3 (three) times a week.   pantoprazole (PROTONIX) 40 MG tablet Take 40 mg by mouth daily.    potassium chloride SA (K-DUR,KLOR-CON) 20 MEQ tablet Take 20 mEq by mouth daily.    tamsulosin (FLOMAX) 0.4 MG CAPS capsule Take 1 capsule by mouth daily.   triamcinolone cream (KENALOG) 0.5 % Apply 1 application topically as needed (SKIN).    valsartan-hydrochlorothiazide (DIOVAN HCT) 160-25 MG tablet Take 1 tablet by mouth daily.   warfarin (COUMADIN) 4 MG tablet Take 4 mg by mouth daily.     Allergies: Allergies  Allergen Reactions   Geralyn Flash [Fish Allergy] Nausea And Vomiting    Social History: The patient  reports that he has never smoked. He has never used smokeless tobacco. He reports that he does not drink alcohol or use drugs.   Family History: The patient's family history includes Hypertension in his father and mother.   Review of Systems: Please see the history of present illness.   All other systems are reviewed and negative.   Physical Exam: VS:  BP 126/76    Pulse 93    Ht 6' (1.829 m)    Wt 182 lb (82.6 kg)    SpO2 98%    BMI 24.68 kg/m  .  BMI Body mass index is 24.68 kg/m.  Wt Readings from Last 3 Encounters:  06/26/19 182 lb (82.6 kg)  06/18/19 185 lb 6.4 oz (84.1 kg)  04/12/19 190 lb (86.2 kg)    General: Pleasant. Well developed, well nourished and in no acute distress. His weight is down from 194 from July of 2019  HEENT: Normal.  Neck: Supple, no JVD, carotid bruits, or masses noted.  Cardiac: Regular rate and rhythm. No murmurs, rubs, or gallops. No edema.  Respiratory:  Lungs are clear to auscultation bilaterally with normal work of breathing.  GI: Soft and nontender.  MS: No deformity or  atrophy. Gait and ROM intact.  Skin: Warm and dry. Color is normal.  Neuro:  Strength and sensation are intact and no gross focal deficits noted.  Psych: Alert, appropriate and with normal affect.   LABORATORY DATA:  EKG:  EKG is not ordered today.   Lab Results  Component Value Date   WBC 4.7 06/01/2016   HGB 13.1 (L) 06/01/2016   HCT 41.2 06/01/2016   PLT 218 06/01/2016   GLUCOSE 95 01/20/2017   CHOL 121 09/16/2012   TRIG 174 (H) 09/16/2012   HDL 6 (L) 09/16/2012   LDLCALC 80 09/16/2012   ALT 33 01/18/2018   AST 37 01/18/2018   NA 141 01/20/2017   K 4.6 01/20/2017   CL 103 01/20/2017   CREATININE 1.35 (H) 01/20/2017   BUN 22 01/20/2017  CO2 23 01/20/2017   TSH 2.610 01/18/2018   INR 1.84 06/11/2016   HGBA1C 6.5 (H) 09/14/2012       BNP (last 3 results) No results for input(s): BNP in the last 8760 hours.  ProBNP (last 3 results) No results for input(s): PROBNP in the last 8760 hours.   Other Studies Reviewed Today:  Echo Study Conclusions 2017  - Left ventricle: The cavity size was normal. Systolic function was   moderately to severely reduced. The estimated ejection fraction   was in the range of 30% to 35%. Diffuse hypokinesis, worse in the   septum. - Aortic valve: Transvalvular velocity was within the normal range.   There was no stenosis. There was no regurgitation. - Mitral valve: A bioprosthesis was present. Pressure half-time: 63   ms. Mean gradient (D): 12 mm Hg. - Left atrium: The atrium was moderately dilated. - Right ventricle: The cavity size was normal. Wall thickness was   normal. Systolic function was moderately reduced. - Tricuspid valve: There was no regurgitation. - Inferior vena cava: The vessel was normal in size. The   respirophasic diameter changes were in the normal range (>= 50%),   consistent with normal central venous pressure.  Assessment/Plan:  1. Prior MVR - on coumadin therapy. Will get his echo updated.   2.  Chronic systolic HF/NICM - his medicines are very unclear. We are adding back the Toprol - he is to stop the Losartan HCT as instructed last week. Rechecking lab today. Update his echo and will then decide about trying to augment his medicines further (add aldactone ?try Entresto). He is to bring all of his medicine bottles going forward. Probably need to elicit help from his family. His medicine compliance is going to be an issue in my opinion going forward.   3. PAD - not discussed.   4. HTN - his BP is fine today. He is not on the correct medicine - he has agreed to let us speak to his daughter and help clarify.   5. Multiple arrhythmias - has ICD in place - no longer on amiodarone - followed by EP - sees them next week. Will get the echo at that visit.   6. Weight loss - concerning. [place] lab today.    7. COVID-19 Education: The signs and symptoms of COVID-19 were discussed with the patient and how to seek care for testing (follow up with PCP or arrange E-visit).  The importance of social distancing, staying at home, hand hygiene and wearing a mask when out in public were discussed today.  Current medicines are reviewed with the patient today.  The patient does not have concerns regarding medicines other than what has been noted above.  The following changes have been made:  See above.  Labs/ tests ordered today include:    Orders Placed This Encounter  Procedures   Basic metabolic panel   CBC   Hepatic function panel   TSH     Disposition:   FU with [place] a month after his visit next week. Lab today. Adding echo for next week. Further disposition to follow.   Patient is agreeable to this plan and will call if any problems develop in the interim.   SignedTruitt Merle, NP  06/26/2019 11:44 AM  [place] [address] [place], [place]  [address] Phone: [phone] Fax: [phone]

## 2019-06-26 ENCOUNTER — Other Ambulatory Visit: Payer: Self-pay

## 2019-06-26 ENCOUNTER — Ambulatory Visit: Payer: Medicare HMO | Admitting: Nurse Practitioner

## 2019-06-26 ENCOUNTER — Other Ambulatory Visit: Payer: Medicare HMO

## 2019-06-26 ENCOUNTER — Encounter: Payer: Self-pay | Admitting: Nurse Practitioner

## 2019-06-26 VITALS — BP 126/76 | HR 93 | Ht 72.0 in | Wt 182.0 lb

## 2019-06-26 DIAGNOSIS — I10 Essential (primary) hypertension: Secondary | ICD-10-CM | POA: Diagnosis not present

## 2019-06-26 DIAGNOSIS — Z95810 Presence of automatic (implantable) cardiac defibrillator: Secondary | ICD-10-CM

## 2019-06-26 DIAGNOSIS — I480 Paroxysmal atrial fibrillation: Secondary | ICD-10-CM | POA: Diagnosis not present

## 2019-06-26 DIAGNOSIS — R634 Abnormal weight loss: Secondary | ICD-10-CM

## 2019-06-26 DIAGNOSIS — I5022 Chronic systolic (congestive) heart failure: Secondary | ICD-10-CM | POA: Diagnosis not present

## 2019-06-26 DIAGNOSIS — Z953 Presence of xenogenic heart valve: Secondary | ICD-10-CM

## 2019-06-26 DIAGNOSIS — I428 Other cardiomyopathies: Secondary | ICD-10-CM | POA: Diagnosis not present

## 2019-06-26 DIAGNOSIS — Z7189 Other specified counseling: Secondary | ICD-10-CM | POA: Diagnosis not present

## 2019-06-26 NOTE — Patient Instructions (Addendum)
After Visit Summary:  We will be checking the following labs today - BMET, CBC, HPF, and TSH   Medication Instructions:    Continue with your current medicines.   We do not want you to take the Losartan HCTZ - STOP this medicine  GO BACK ON THE TOPROL.    If you need a refill on your cardiac medications before your next appointment, please call your pharmacy.     Testing/Procedures To Be Arranged:  Echocardiogram  Follow-Up:   See Dr. [name] as planned next week - we will get the echocardiogram prior to that.   See me or Dr. Tamala Julian one month later. Bring all your medicine bottles with you.     At [facility], you and your health needs are our priority.  As part of our continuing mission to provide you with exceptional heart care, we have created designated Provider Care Teams.  These Care Teams include your primary Cardiologist (physician) and Advanced Practice Providers (APPs -  Physician Assistants and Nurse Practitioners) who all work together to provide you with the care you need, when you need it.  Special Instructions:  . Stay safe, stay home, wash your hands for at least 20 seconds and wear a mask when out in public.  . It was good to talk with you today.  . Bring all your medicine bottles with you next week.    Call the [place] office at [phone] if you have any questions, problems or concerns.

## 2019-06-27 LAB — BASIC METABOLIC PANEL
BUN/Creatinine Ratio: 17 (ref 10–24)
BUN: 30 mg/dL — ABNORMAL HIGH (ref 8–27)
CO2: 23 mmol/L (ref 20–29)
Calcium: 9.6 mg/dL (ref 8.6–10.2)
Chloride: 104 mmol/L (ref 96–106)
Creatinine, Ser: 1.75 mg/dL — ABNORMAL HIGH (ref 0.76–1.27)
GFR calc Af Amer: 43 mL/min/{1.73_m2} — ABNORMAL LOW (ref >59)
GFR calc non Af Amer: 37 mL/min/{1.73_m2} — ABNORMAL LOW (ref >59)
Glucose: 97 mg/dL (ref 65–99)
Potassium: 4.7 mmol/L (ref 3.5–5.2)
Sodium: 140 mmol/L (ref 134–144)

## 2019-06-27 LAB — CBC
Hematocrit: 45.5 % (ref 37.5–51.0)
Hemoglobin: 14.3 g/dL (ref 13.0–17.7)
MCH: 23.4 pg — ABNORMAL LOW (ref 26.6–33.0)
MCHC: 31.4 g/dL — ABNORMAL LOW (ref 31.5–35.7)
MCV: 75 fL — ABNORMAL LOW (ref 79–97)
Platelets: 341 10*3/uL (ref 150–450)
RBC: 6.1 x10E6/uL — ABNORMAL HIGH (ref 4.14–5.80)
RDW: 18.5 % — ABNORMAL HIGH (ref 11.6–15.4)
WBC: 4.9 10*3/uL (ref 3.4–10.8)

## 2019-06-27 LAB — HEPATIC FUNCTION PANEL
ALT: 30 IU/L (ref 0–44)
AST: 33 IU/L (ref 0–40)
Albumin: 4.3 g/dL (ref 3.7–4.7)
Alkaline Phosphatase: 75 IU/L (ref 39–117)
Bilirubin Total: 0.8 mg/dL (ref 0.0–1.2)
Bilirubin, Direct: 0.24 mg/dL (ref 0.00–0.40)
Total Protein: 6.9 g/dL (ref 6.0–8.5)

## 2019-06-27 LAB — TSH: TSH: 1.58 u[IU]/mL (ref 0.450–4.500)

## 2019-06-27 NOTE — Progress Notes (Signed)
Remote ICD transmission.   

## 2019-06-28 DIAGNOSIS — N529 Male erectile dysfunction, unspecified: Secondary | ICD-10-CM | POA: Diagnosis not present

## 2019-06-28 DIAGNOSIS — K8020 Calculus of gallbladder without cholecystitis without obstruction: Secondary | ICD-10-CM | POA: Diagnosis not present

## 2019-06-28 DIAGNOSIS — K219 Gastro-esophageal reflux disease without esophagitis: Secondary | ICD-10-CM | POA: Diagnosis not present

## 2019-06-28 DIAGNOSIS — M159 Polyosteoarthritis, unspecified: Secondary | ICD-10-CM | POA: Diagnosis not present

## 2019-06-28 DIAGNOSIS — J309 Allergic rhinitis, unspecified: Secondary | ICD-10-CM | POA: Diagnosis not present

## 2019-06-28 DIAGNOSIS — E7800 Pure hypercholesterolemia, unspecified: Secondary | ICD-10-CM | POA: Diagnosis not present

## 2019-06-28 DIAGNOSIS — I498 Other specified cardiac arrhythmias: Secondary | ICD-10-CM | POA: Diagnosis not present

## 2019-06-28 DIAGNOSIS — N183 Chronic kidney disease, stage 3 unspecified: Secondary | ICD-10-CM | POA: Diagnosis not present

## 2019-06-28 DIAGNOSIS — R791 Abnormal coagulation profile: Secondary | ICD-10-CM | POA: Diagnosis not present

## 2019-07-02 ENCOUNTER — Other Ambulatory Visit (HOSPITAL_COMMUNITY): Payer: Self-pay | Admitting: Nurse Practitioner

## 2019-07-02 DIAGNOSIS — Z9889 Other specified postprocedural states: Secondary | ICD-10-CM

## 2019-07-03 ENCOUNTER — Other Ambulatory Visit: Payer: Self-pay

## 2019-07-03 ENCOUNTER — Ambulatory Visit (INDEPENDENT_AMBULATORY_CARE_PROVIDER_SITE_OTHER): Payer: Medicare HMO | Admitting: Cardiology

## 2019-07-03 ENCOUNTER — Ambulatory Visit (HOSPITAL_COMMUNITY): Payer: Medicare HMO | Attending: Cardiology

## 2019-07-03 ENCOUNTER — Encounter: Payer: Self-pay | Admitting: Cardiology

## 2019-07-03 VITALS — BP 136/72 | HR 83 | Ht 72.0 in | Wt 181.0 lb

## 2019-07-03 DIAGNOSIS — I071 Rheumatic tricuspid insufficiency: Secondary | ICD-10-CM | POA: Insufficient documentation

## 2019-07-03 DIAGNOSIS — Z953 Presence of xenogenic heart valve: Secondary | ICD-10-CM | POA: Diagnosis not present

## 2019-07-03 DIAGNOSIS — E1151 Type 2 diabetes mellitus with diabetic peripheral angiopathy without gangrene: Secondary | ICD-10-CM | POA: Diagnosis not present

## 2019-07-03 DIAGNOSIS — Z79899 Other long term (current) drug therapy: Secondary | ICD-10-CM | POA: Diagnosis not present

## 2019-07-03 DIAGNOSIS — I2510 Atherosclerotic heart disease of native coronary artery without angina pectoris: Secondary | ICD-10-CM | POA: Diagnosis not present

## 2019-07-03 DIAGNOSIS — Z95810 Presence of automatic (implantable) cardiac defibrillator: Secondary | ICD-10-CM | POA: Insufficient documentation

## 2019-07-03 DIAGNOSIS — Z9889 Other specified postprocedural states: Secondary | ICD-10-CM

## 2019-07-03 DIAGNOSIS — I428 Other cardiomyopathies: Secondary | ICD-10-CM

## 2019-07-03 DIAGNOSIS — I509 Heart failure, unspecified: Secondary | ICD-10-CM | POA: Insufficient documentation

## 2019-07-03 DIAGNOSIS — R799 Abnormal finding of blood chemistry, unspecified: Secondary | ICD-10-CM

## 2019-07-03 DIAGNOSIS — I110 Hypertensive heart disease with heart failure: Secondary | ICD-10-CM | POA: Diagnosis not present

## 2019-07-03 DIAGNOSIS — R7989 Other specified abnormal findings of blood chemistry: Secondary | ICD-10-CM

## 2019-07-03 NOTE — Addendum Note (Signed)
Addended by: Stanton Kidney on: 07/03/2019 03:08 PM   Modules accepted: Orders

## 2019-07-03 NOTE — Patient Instructions (Signed)
Medication Instructions:  Your physician recommends that you continue on your current medications as directed. Please refer to the Current Medication list given to you today.  *If you need a refill on your cardiac medications before your next appointment, please call your pharmacy*  Labwork: Today:  BMET If you have labs (blood work) drawn today and your tests are completely normal, you will receive your results only by:  [place] (if you have MyChart) OR  A paper copy in the mail If you have any lab test that is abnormal or we need to change your treatment, we will call you to review the results.  Testing/Procedures: None ordered  Follow-Up: Remote monitoring is used to monitor your Pacemaker or ICD from home. This monitoring reduces the number of office visits required to check your device to one time per year. It allows [place] to keep an eye on the functioning of your device to ensure it is working properly. You are scheduled for a device check from home on 09/17/19. You may send your transmission at any time that day. If you have a wireless device, the transmission will be sent automatically. After your physician reviews your transmission, you will receive a postcard with your next transmission date.  At [facility], you and your health needs are our priority.  As part of our continuing mission to provide you with exceptional heart care, we have created designated Provider Care Teams.  These Care Teams include your primary Cardiologist (physician) and Advanced Practice Providers (APPs -  Physician Assistants and Nurse Practitioners) who all work together to provide you with the care you need, when you need it.  You will need a follow up appointment in 1 year.  Please call our office 2 months in advance to schedule this appointment.  You may see Dr [name] or one of the following Advanced Practice Providers on your designated Care Team:    Chanetta Marshall, NP  Tommye Standard, PA-C   [name], [place]  Thank you for choosing [facility]!!   Trinidad Curet, RN [phone]

## 2019-07-03 NOTE — Progress Notes (Signed)
Electrophysiology Office Note   Date:  07/03/2019   ID:  Ryan [name] Mar 28, 1946, MRN [id number]  PCP:  [name], MD  Cardiologist:  Pernell Dupre Primary Electrophysiologist:   Meredith Leeds, MD    No chief complaint on file.    History of Present Illness: Ryan [name] is a 73 y.o. male who presents today for electrophysiology evaluation.   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   He has been having a high volume of PVCs. His most recent monitor showed that his PVC burden was up to 20%. He had an echocardiogram showed an EF of 30-35%, which could be coming from his PVCs. He was put on [name] see if we can decrease his PVCs which may help his low EF. He was in accelerated junctional rhythm when getting his most recent TTE.  Today, denies symptoms of palpitations, chest pain, shortness of breath, orthopnea, PND, lower extremity edema, claudication, dizziness, presyncope, syncope, bleeding, or neurologic sequela. The patient is tolerating medications without difficulties.  His main symptoms are of weakness and fatigue.  He says that sometimes he has to walk slower to get to where he is going.  He has an echocardiogram that is pending.   Past Medical History:  Diagnosis Date  . Atrial fibrillation ([place])   . 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 Diabetes mellitus without complication ([place])   . Endocarditis   . Hypertension   . Peripheral vascular disease ([place])   . PVC's (premature ventricular contractions)   . SVT (supraventricular tachycardia)  long RP    Past Surgical History:  Procedure Laterality Date  . ABDOMINAL AORTAGRAM N/A 10/03/2012   Procedure: ABDOMINAL Maxcine Ham;  Surgeon: [name], MD;  Location: [facility] CATH LAB;  Service: Cardiovascular;  Laterality: N/A;  . CORONARY ANGIOGRAM  09/21/2012   Procedure: CORONARY ANGIOGRAM;  Surgeon: [name], MD;  Location: [facility] CATH LAB;  Service: Cardiovascular;;  . EP IMPLANTABLE DEVICE N/A 06/11/2016   Procedure: ICD Implant;  Surgeon:  Meredith Leeds, MD;  Location: [place] CV LAB;  Service: Cardiovascular;  Laterality: N/A;  . EXTREMITY WIRE/PIN REMOVAL  09/14/2012   Procedure: REMOVAL K-WIRE/PIN EXTREMITY;  Surgeon: [name], MD;  Location: [place];  Service: Orthopedics;  Laterality: Right;  Right Foot  . I&D EXTREMITY  09/14/2012   Procedure: IRRIGATION AND DEBRIDEMENT EXTREMITY;  Surgeon: Tennis Must, MD;  Location: [place];  Service: Orthopedics;  Laterality: Right;  . INTRAOPERATIVE TRANSESOPHAGEAL ECHOCARDIOGRAM  09/26/2012   Procedure: INTRAOPERATIVE TRANSESOPHAGEAL ECHOCARDIOGRAM;  Surgeon: [name], MD;  Location: [facility] OR;  Service: Open Heart Surgery;  Laterality: N/A;  . MITRAL VALVE REPLACEMENT  09/26/2012   Procedure: MITRAL VALVE (MV) REPLACEMENT;  Surgeon: [name], MD;  Location: [place] OR;  Service: Open Heart Surgery;  Laterality: N/A;  . RIGHT HEART CATHETERIZATION  09/21/2012   Procedure: RIGHT HEART CATH;  Surgeon: [name], MD;  Location: [facility] CATH LAB;  Service: Cardiovascular;;  . TEE WITHOUT CARDIOVERSION  09/18/2012   Procedure: TRANSESOPHAGEAL ECHOCARDIOGRAM (TEE);  Surgeon: [name], MD;  Location: [facility] ENDOSCOPY;  Service: Cardiovascular;  Laterality: N/A;     Current Outpatient Medications  Medication Sig Dispense Refill  . atorvastatin (LIPITOR) 40 MG tablet Take 1 tablet (40 mg total) by mouth daily at 6 PM. 30 tablet 5  . HYDROcodone-acetaminophen (NORCO/VICODIN) 5-325 MG per  tablet Take 1 tablet by mouth 2 (two) times daily as needed for moderate pain.     . metoprolol succinate (TOPROL-XL) 25 MG 24 hr tablet TAKE 1 TABLET DAILY 90 tablet 3  . mexiletine (MEXITIL) 250 MG capsule Take 1 capsule (250 mg total) by mouth 2 (two) times daily. 60 capsule 11  . Multiple Vitamins-Minerals (CENTRUM SILVER PO) Take 1 tablet by mouth 3  (three) times a week.    . pantoprazole (PROTONIX) 40 MG tablet Take 40 mg by mouth daily.     . potassium chloride SA (K-DUR,KLOR-CON) 20 MEQ tablet Take 20 mEq by mouth daily.     . tamsulosin (FLOMAX) 0.4 MG CAPS capsule Take 1 capsule by mouth daily.    Marland Kitchen triamcinolone cream (KENALOG) 0.5 % Apply 1 application topically as needed (SKIN).     . valsartan-hydrochlorothiazide (DIOVAN HCT) 160-25 MG tablet Take 1 tablet by mouth daily. 90 tablet 3  . warfarin (COUMADIN) 4 MG tablet Take 4 mg by mouth daily.     No current facility-administered medications for this visit.     Allergies:   [name] allergy]   Social History:  The patient  reports that he has never smoked. He has never used smokeless tobacco. He reports that he does not drink alcohol or use drugs.   Family History:  The patient's family history includes Hypertension in his father and mother.    ROS:  Please see the history of present illness.   Otherwise, review of systems is positive for none.   All other systems are reviewed and negative.   PHYSICAL EXAM: VS:  BP 136/72   Pulse 83   Ht 6' (1.829 m)   Wt 181 lb (82.1 kg)   SpO2 99%   BMI 24.55 kg/m  , BMI Body mass index is 24.55 kg/m. GEN: Well nourished, well developed, in no acute distress  HEENT: normal  Neck: no JVD, carotid bruits, or masses Cardiac: RRR; no murmurs, rubs, or gallops,no edema  Respiratory:  clear to auscultation bilaterally, normal work of breathing GI: soft, nontender, nondistended, + BS MS: no deformity or atrophy  Skin: warm and dry, device site well healed Neuro:  Strength and sensation are intact Psych: euthymic mood, full affect  EKG:  EKG is ordered today. Personal review of the ekg ordered shows atrial paced, rate 83  Personal review of the device interrogation today. Results in [place]: 06/26/2019: ALT 30; BUN 30; Creatinine, Ser 1.75; Hemoglobin 14.3; Platelets 341; Potassium 4.7; Sodium 140; TSH 1.580     Lipid Panel     Component Value Date/Time   CHOL 121 09/16/2012 0520   TRIG 174 (H) 09/16/2012 0520   HDL 6 (L) 09/16/2012 0520   CHOLHDL 20.2 09/16/2012 0520   VLDL 35 09/16/2012 0520   LDLCALC 80 09/16/2012 0520     Wt Readings from Last 3 Encounters:  07/03/19 181 lb (82.1 kg)  06/26/19 182 lb (82.6 kg)  06/18/19 185 lb 6.4 oz (84.1 kg)      Other studies Reviewed: Additional studies/ records that were reviewed today include: 48 hour monitor  Review of the above records today demonstrates:  Minimum: 59 bpm at 9:31 PM Mean: 88 bpm mV Maximum: 134 bpm at 9:44 AM Ventricular ectopy 22.08% of beats Supraventricular events 0.92% of beats Short runs of SVT  TTE 04/26/16 - Left ventricle: The cavity size was normal. Systolic function was   moderately to severely reduced. The estimated ejection  fraction   was in the range of 30% to 35%. Diffuse hypokinesis, worse in the   septum. - Aortic valve: Transvalvular velocity was within the normal range.   There was no stenosis. There was no regurgitation. - Mitral valve: A bioprosthesis was present. Pressure half-time: 63   ms. Mean gradient (D): 12 mm Hg. - Left atrium: The atrium was moderately dilated. - Right ventricle: The cavity size was normal. Wall thickness was   normal. Systolic function was moderately reduced. - Tricuspid valve: There was no regurgitation. - Inferior vena cava: The vessel was normal in size. The   respirophasic diameter changes were in the normal range (>= 50%),   consistent with normal central venous pressure.  Impressions:  - Mean gradient across the mitral valve is significantly increased   compared with 12/03/15. 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 Also, the TVI   ratio is 3.4, consistent with pathologic obstruction. 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   ASSESSMENT AND PLAN:  1. Junctional Rhythm: Status post Medtronic dual-chamber ICD implanted 06/11/2016.  Device functioning appropriately.  No changes.    2. PAF -found on device interrogation.  Currently on Coumadin.  This patients CHA2DS2-VASc Score and unadjusted Ischemic Stroke Rate (% per year) is equal to 3.2 % stroke rate/year from a score of 3  Above score calculated as 1 point each if present [CHF, HTN, DM, Vascular=MI/PAD/Aortic Plaque, Age if 65-74, or Male] Above score calculated as 2 points each if present [Age > 75, or Stroke/TIA/TE]  3. PVC: Currently on mexiletine.  4. S/p MVR -stable on most recent echo  5.  Chronic systolic heart failure due to nonischemic cardiomyopathy: Status post Medtronic ICD on optimal medical therapy.  Device functioning appropriately.  Having some weakness and fatigue.  At this point is unclear to me as the cause.  He has an echocardiogram currently pending.  Current asked .medicines are reviewed at length with the patient today.   The patient does not have concerns regarding his medicines.  The following changes were made today: none  Labs/ tests ordered today include:  Orders Placed This Encounter  Procedures  . EKG 12-Lead   Case discussed with primary cardiology  Disposition:   FU with   12 months  Signed,  Meredith Leeds, MD  07/03/2019 2:55 PM     [place] [address] [place] [address] [phone] (office) [phone] (fax)

## 2019-07-04 LAB — BASIC METABOLIC PANEL
BUN/Creatinine Ratio: 23 (ref 10–24)
BUN: 35 mg/dL — ABNORMAL HIGH (ref 8–27)
CO2: 22 mmol/L (ref 20–29)
Calcium: 9.2 mg/dL (ref 8.6–10.2)
Chloride: 106 mmol/L (ref 96–106)
Creatinine, Ser: 1.52 mg/dL — ABNORMAL HIGH (ref 0.76–1.27)
GFR calc Af Amer: 51 mL/min/{1.73_m2} — ABNORMAL LOW (ref >59)
GFR calc non Af Amer: 44 mL/min/{1.73_m2} — ABNORMAL LOW (ref >59)
Glucose: 95 mg/dL (ref 65–99)
Potassium: 4.3 mmol/L (ref 3.5–5.2)
Sodium: 141 mmol/L (ref 134–144)

## 2019-07-05 DIAGNOSIS — M159 Polyosteoarthritis, unspecified: Secondary | ICD-10-CM | POA: Diagnosis not present

## 2019-07-05 DIAGNOSIS — E7800 Pure hypercholesterolemia, unspecified: Secondary | ICD-10-CM | POA: Diagnosis not present

## 2019-07-05 DIAGNOSIS — I498 Other specified cardiac arrhythmias: Secondary | ICD-10-CM | POA: Diagnosis not present

## 2019-07-05 DIAGNOSIS — N529 Male erectile dysfunction, unspecified: Secondary | ICD-10-CM | POA: Diagnosis not present

## 2019-07-05 DIAGNOSIS — K8020 Calculus of gallbladder without cholecystitis without obstruction: Secondary | ICD-10-CM | POA: Diagnosis not present

## 2019-07-05 DIAGNOSIS — R791 Abnormal coagulation profile: Secondary | ICD-10-CM | POA: Diagnosis not present

## 2019-07-05 DIAGNOSIS — K219 Gastro-esophageal reflux disease without esophagitis: Secondary | ICD-10-CM | POA: Diagnosis not present

## 2019-07-05 DIAGNOSIS — N183 Chronic kidney disease, stage 3 unspecified: Secondary | ICD-10-CM | POA: Diagnosis not present

## 2019-07-05 DIAGNOSIS — J309 Allergic rhinitis, unspecified: Secondary | ICD-10-CM | POA: Diagnosis not present

## 2019-07-19 DIAGNOSIS — N183 Chronic kidney disease, stage 3 unspecified: Secondary | ICD-10-CM | POA: Diagnosis not present

## 2019-07-19 DIAGNOSIS — E118 Type 2 diabetes mellitus with unspecified complications: Secondary | ICD-10-CM | POA: Diagnosis not present

## 2019-07-19 DIAGNOSIS — N529 Male erectile dysfunction, unspecified: Secondary | ICD-10-CM | POA: Diagnosis not present

## 2019-07-19 DIAGNOSIS — M159 Polyosteoarthritis, unspecified: Secondary | ICD-10-CM | POA: Diagnosis not present

## 2019-07-19 DIAGNOSIS — I10 Essential (primary) hypertension: Secondary | ICD-10-CM | POA: Diagnosis not present

## 2019-07-19 DIAGNOSIS — E7800 Pure hypercholesterolemia, unspecified: Secondary | ICD-10-CM | POA: Diagnosis not present

## 2019-07-19 DIAGNOSIS — J309 Allergic rhinitis, unspecified: Secondary | ICD-10-CM | POA: Diagnosis not present

## 2019-07-19 DIAGNOSIS — I498 Other specified cardiac arrhythmias: Secondary | ICD-10-CM | POA: Diagnosis not present

## 2019-07-19 DIAGNOSIS — K219 Gastro-esophageal reflux disease without esophagitis: Secondary | ICD-10-CM | POA: Diagnosis not present

## 2019-07-19 DIAGNOSIS — R791 Abnormal coagulation profile: Secondary | ICD-10-CM | POA: Diagnosis not present

## 2019-07-19 DIAGNOSIS — K8020 Calculus of gallbladder without cholecystitis without obstruction: Secondary | ICD-10-CM | POA: Diagnosis not present

## 2019-08-02 DIAGNOSIS — K8020 Calculus of gallbladder without cholecystitis without obstruction: Secondary | ICD-10-CM | POA: Diagnosis not present

## 2019-08-02 DIAGNOSIS — I498 Other specified cardiac arrhythmias: Secondary | ICD-10-CM | POA: Diagnosis not present

## 2019-08-02 DIAGNOSIS — M159 Polyosteoarthritis, unspecified: Secondary | ICD-10-CM | POA: Diagnosis not present

## 2019-08-02 DIAGNOSIS — H353131 Nonexudative age-related macular degeneration, bilateral, early dry stage: Secondary | ICD-10-CM | POA: Diagnosis not present

## 2019-08-02 DIAGNOSIS — I739 Peripheral vascular disease, unspecified: Secondary | ICD-10-CM | POA: Diagnosis not present

## 2019-08-02 DIAGNOSIS — N529 Male erectile dysfunction, unspecified: Secondary | ICD-10-CM | POA: Diagnosis not present

## 2019-08-02 DIAGNOSIS — K219 Gastro-esophageal reflux disease without esophagitis: Secondary | ICD-10-CM | POA: Diagnosis not present

## 2019-08-02 DIAGNOSIS — Z7901 Long term (current) use of anticoagulants: Secondary | ICD-10-CM | POA: Diagnosis not present

## 2019-08-02 DIAGNOSIS — E119 Type 2 diabetes mellitus without complications: Secondary | ICD-10-CM | POA: Diagnosis not present

## 2019-08-02 DIAGNOSIS — N183 Chronic kidney disease, stage 3 unspecified: Secondary | ICD-10-CM | POA: Diagnosis not present

## 2019-08-02 DIAGNOSIS — J309 Allergic rhinitis, unspecified: Secondary | ICD-10-CM | POA: Diagnosis not present

## 2019-08-02 DIAGNOSIS — H25813 Combined forms of age-related cataract, bilateral: Secondary | ICD-10-CM | POA: Diagnosis not present

## 2019-08-08 NOTE — Progress Notes (Signed)
Cardiology Office Note:    Date:  08/09/2019   ID:  Ryan [name], DOB 1945-12-04, MRN [id number]  PCP:  [name], MD  Cardiologist:  [name], MD   Referring MD: [name], MD   Chief Complaint  Patient presents with  . Congestive Heart Failure  . Cardiac Valve Problem    History of Present Illness:    Ryan [name] is a 73 y.o. male with a hx of endocarditis, subsequent bioprosthetic AoV,PAF/SVT, PVC's at high burden(20%)amiodarone--> [name] --> Mexilitine (Dr. 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  He is doing well.  He denies orthopnea, PND, and peripheral edema.  No chest pain.  No medication side effects.  He was seen by Dr. [name] earlier this year.  He is to be seen again in 1 year.  He is tolerating Mexitil without difficulty.  Past Medical History:  Diagnosis Date  . Atrial fibrillation ([place])   . 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 Diabetes mellitus without complication ([place])   . Endocarditis   . Hypertension   . Peripheral vascular disease ([place])   . PVC's (premature ventricular contractions)   . SVT (supraventricular tachycardia)  long RP     Past Surgical History:  Procedure Laterality Date  . ABDOMINAL AORTAGRAM N/A 10/03/2012   Procedure: ABDOMINAL Maxcine Ham;  Surgeon: [name], MD;  Location: [facility] CATH LAB;  Service: Cardiovascular;  Laterality: N/A;  . CORONARY ANGIOGRAM  09/21/2012   Procedure: CORONARY ANGIOGRAM;  Surgeon: [name], MD;  [facility] CATH LAB;  Service: Cardiovascular;;  . EP IMPLANTABLE DEVICE N/A 06/11/2016   Procedure: ICD Implant;  Surgeon: Will Meredith Leeds, MD;  Location: [place] CV LAB;  Service: Cardiovascular;  Laterality: N/A;  . EXTREMITY WIRE/PIN REMOVAL  09/14/2012   Procedure: REMOVAL K-WIRE/PIN EXTREMITY;  Surgeon: [name], MD;  Location: [place];  Service: Orthopedics;  Laterality: Right;   Right Foot  . I&D EXTREMITY  09/14/2012   Procedure: IRRIGATION AND DEBRIDEMENT EXTREMITY;  Surgeon: Tennis Must, MD;  Location: [place];  Service: Orthopedics;  Laterality: Right;  . INTRAOPERATIVE TRANSESOPHAGEAL ECHOCARDIOGRAM  09/26/2012   Procedure: INTRAOPERATIVE TRANSESOPHAGEAL ECHOCARDIOGRAM;  Surgeon: [name], MD;  Location: [facility] OR;  Service: Open Heart Surgery;  Laterality: N/A;  . MITRAL VALVE REPLACEMENT  09/26/2012   Procedure: MITRAL VALVE (MV) REPLACEMENT;  Surgeon: [name], MD;  Location: [place] OR;  Service: Open Heart Surgery;  Laterality: N/A;  . RIGHT HEART CATHETERIZATION  09/21/2012   Procedure: RIGHT HEART CATH;  Surgeon: [name], MD;  Location: [facility] CATH LAB;  Service: Cardiovascular;;  . TEE WITHOUT CARDIOVERSION  09/18/2012   Procedure: TRANSESOPHAGEAL ECHOCARDIOGRAM (TEE);  Surgeon: [name], MD;  Location: [facility] ENDOSCOPY;  Service: Cardiovascular;  Laterality: N/A;    Current Medications: Current Meds  Medication Sig  . atorvastatin (LIPITOR) 40 MG tablet Take 1 tablet (40 mg total) by mouth daily at 6 PM.  . HYDROcodone-acetaminophen (NORCO/VICODIN) 5-325 MG per tablet Take 1 tablet by mouth 2 (two) times daily as needed for moderate pain.   . metoprolol succinate (TOPROL-XL) 25 MG 24 hr tablet TAKE 1 TABLET DAILY  . mexiletine (MEXITIL) 250 MG capsule Take 1 capsule (250 mg total) by mouth 2 (two) times daily.  . Multiple Vitamins-Minerals (CENTRUM SILVER PO) Take 1 tablet by mouth 3 (three) times a week.  . pantoprazole (PROTONIX) 40 MG tablet Take 40 mg  by mouth daily.   . potassium chloride SA (K-DUR,KLOR-CON) 20 MEQ tablet Take 20 mEq by mouth daily.   . tamsulosin (FLOMAX) 0.4 MG CAPS capsule Take 1 capsule by mouth daily.  Marland Kitchen triamcinolone cream (KENALOG) 0.5 % Apply 1 application topically as needed (SKIN).   . valsartan-hydrochlorothiazide (DIOVAN HCT) 160-25 MG tablet Take 1 tablet by mouth daily.  . Warfarin Sodium (COUMADIN PO) Take by  mouth. Take as directed     Allergies:   [name] allergy]   Social History   Socioeconomic History  . Marital status: Legally Separated    Spouse name: Not on file  . Number of children: 3  . Years of education: Not on file  . Highest education level: Not on file  Occupational History  . Not on file  Social Needs  . Financial resource strain: Not on file  . Food insecurity    Worry: Not on file    Inability: Not on file  . Transportation needs    Medical: Not on file    Non-medical: Not on file  Tobacco Use  . Smoking status: Never Smoker  . Smokeless tobacco: Never Used  Substance and Sexual Activity  . Alcohol use: No    Alcohol/week: 0.0 standard drinks  . Drug use: No  . Sexual activity: Not on file  Lifestyle  . Physical activity    Days per week: Not on file    Minutes per session: Not on file  . Stress: Not on file  Relationships  . Social Herbalist on phone: Not on file    Gets together: Not on file    Attends religious service: Not on file    Active member of club or organization: Not on file    Attends meetings of clubs or organizations: Not on file    Relationship status: Not on file  Other Topics Concern  . Not on file  Social History Narrative   Daughter lives with him.  Retired Advertising account planner     Family History: The patient's family history includes Hypertension in his father and mother.  ROS:   Please see the history of present illness.    Some fatigue.  He understands that the echocardiogram despite adjustments in medical therapy have remained about the same.  Ventricular ectopy seems to be significantly reduced and he has no palpitations.  All other systems reviewed and are negative.  EKGs/Labs/Other Studies Reviewed:    The following studies were reviewed today:  ECHOCARDIOGRAM 2020: IMPRESSIONS    1. Left ventricular ejection fraction, by visual estimation, is 30 to 35%. The left ventricle has normal function. Normal  left ventricular size. There is no left ventricular hypertrophy.  2. Global right ventricle has normal systolic function.The right ventricular size is moderately enlarged. No increase in right ventricular wall thickness.  3. Left atrial size was normal.  4. Right atrial size was normal.  5. The mitral valve has been repaired/replaced. No evidence of mitral valve regurgitation. No evidence of mitral stenosis.  6. Bioprosthetic mitral valve.     Mean gradient 30mmHg.  7. The tricuspid valve is normal in structure. Tricuspid valve regurgitation is mild.  8. The aortic valve is normal in structure. Aortic valve regurgitation was not visualized by color flow Doppler. Structurally normal aortic valve, with no evidence of sclerosis or stenosis.  9. The pulmonic valve was normal in structure. Pulmonic valve regurgitation is not visualized by color flow Doppler. 10. Moderately elevated pulmonary artery  systolic pressure. 11. A pacer wire is visualized in the RA and RV. 12. The inferior vena cava is normal in size with greater than 50% respiratory variability, suggesting right atrial pressure of 3 mmHg.  In comparison to the previous echocardiogram(s): 04/26/16 EF 30-35%. MV 33mmHg mean.  EKG:  EKG not repeated  Recent Labs: 06/26/2019: ALT 30; Hemoglobin 14.3; Platelets 341; TSH 1.580 07/03/2019: BUN 35; Creatinine, Ser 1.52; Potassium 4.3; Sodium 141  Recent Lipid Panel    Component Value Date/Time   CHOL 121 09/16/2012 0520   TRIG 174 (H) 09/16/2012 0520   HDL 6 (L) 09/16/2012 0520   CHOLHDL 20.2 09/16/2012 0520   VLDL 35 09/16/2012 0520   LDLCALC 80 09/16/2012 0520    Physical Exam:    VS:  BP 136/74   Pulse 88   Ht 6' (1.829 m)   Wt 183 lb (83 kg)   SpO2 100%   BMI 24.82 kg/m     Wt Readings from Last 3 Encounters:  08/09/19 183 lb (83 kg)  07/03/19 181 lb (82.1 kg)  06/26/19 182 lb (82.6 kg)     GEN: Slender and younger than stated age in appearance. No acute distress HEENT:  Normal NECK: No JVD. LYMPHATICS: No lymphadenopathy CARDIAC: Artificial valve closure sounds are heard.  RRR without murmur, gallop, or edema. VASCULAR:  Normal Pulses. No bruits. RESPIRATORY:  Clear to auscultation without rales, wheezing or rhonchi  ABDOMEN: Soft, non-tender, non-distended, No pulsatile mass, MUSCULOSKELETAL: No deformity  SKIN: Warm and dry NEUROLOGIC:  Alert and oriented x 3 PSYCHIATRIC:  Normal affect   ASSESSMENT:    1. Status post mitral valve replacement with bioprosthetic valve   2. Chronic systolic heart failure (HCC)   3. Paroxysmal atrial fibrillation ([place])   4. Essential hypertension   5. ICD (implantable cardioverter-defibrillator) in place   6. Long term current use of antiarrhythmic drug   7. Educated about COVID-19 virus infection    PLAN:    In order of problems listed above:  1. Bioprosthetic mitral valve with clinically normal function. 2. No evidence of volume overload. 3. No complaints of palpitations. 4. Blood pressure control is excellent. 5. Followed in device clinic with normal function 6. No longer on amiodarone but rather Mexitil. 7. The 3W's are being practiced to avoid Covid infection.   Medication Adjustments/Labs and Tests Ordered: Current medicines are reviewed at length with the patient today.  Concerns regarding medicines are outlined above.  No orders of the defined types were placed in this encounter.  No orders of the defined types were placed in this encounter.   Patient Instructions  Medication Instructions:  Your physician recommends that you continue on your current medications as directed. Please refer to the Current Medication list given to you today.  *If you need a refill on your cardiac medications before your next appointment, please call your pharmacy*  Lab Work: None If you have labs (blood work) drawn today and your tests are completely normal, you will receive your results only by: Mar[place] Kitchen MyChart  Message (if you have MyChart) OR . A paper copy in the mail If you have any lab test that is abnormal or we need to change your treatment, we will call you to review the results.  Testing/Procedures: None  Follow-Up: At [facility], you and your health needs are our priority.  As part of our continuing mission to provide you with exceptional heart care, we have created designated Provider Care Teams.  These Care  Teams include your primary Cardiologist (physician) and Advanced Practice Providers (APPs -  Physician Assistants and Nurse Practitioners) who all work together to provide you with the care you need, when you need it.  Your next appointment:   6 month(s)  The format for your next appointment:   In Person  Provider:   You may see [name], MD or one of the following Advanced Practice Providers on your designated Care Team:    Truitt Merle, NP  Cecilie Kicks, NP  Kathyrn Drown, NP   Other Instructions      Signed, [name], MD  08/09/2019 3:58 PM    [place]

## 2019-08-09 ENCOUNTER — Ambulatory Visit: Payer: Medicare HMO | Admitting: Interventional Cardiology

## 2019-08-09 ENCOUNTER — Other Ambulatory Visit: Payer: Self-pay

## 2019-08-09 ENCOUNTER — Encounter: Payer: Self-pay | Admitting: Interventional Cardiology

## 2019-08-09 VITALS — BP 136/74 | HR 88 | Ht 72.0 in | Wt 183.0 lb

## 2019-08-09 DIAGNOSIS — Z953 Presence of xenogenic heart valve: Secondary | ICD-10-CM

## 2019-08-09 DIAGNOSIS — I480 Paroxysmal atrial fibrillation: Secondary | ICD-10-CM | POA: Diagnosis not present

## 2019-08-09 DIAGNOSIS — I5022 Chronic systolic (congestive) heart failure: Secondary | ICD-10-CM

## 2019-08-09 DIAGNOSIS — Z95810 Presence of automatic (implantable) cardiac defibrillator: Secondary | ICD-10-CM

## 2019-08-09 DIAGNOSIS — Z79899 Other long term (current) drug therapy: Secondary | ICD-10-CM

## 2019-08-09 DIAGNOSIS — Z7189 Other specified counseling: Secondary | ICD-10-CM | POA: Diagnosis not present

## 2019-08-09 DIAGNOSIS — I10 Essential (primary) hypertension: Secondary | ICD-10-CM | POA: Diagnosis not present

## 2019-08-09 NOTE — Patient Instructions (Signed)
Medication Instructions:  Your physician recommends that you continue on your current medications as directed. Please refer to the Current Medication list given to you today.  *If you need a refill on your cardiac medications before your next appointment, please call your pharmacy*  Lab Work: None If you have labs (blood work) drawn today and your tests are completely normal, you will receive your results only by: . MyChart Message (if you have MyChart) OR . A paper copy in the mail If you have any lab test that is abnormal or we need to change your treatment, we will call you to review the results.  Testing/Procedures: None  Follow-Up: At [facility], you and your health needs are our priority.  As part of our continuing mission to provide you with exceptional heart care, we have created designated Provider Care Teams.  These Care Teams include your primary Cardiologist (physician) and Advanced Practice Providers (APPs -  Physician Assistants and Nurse Practitioners) who all work together to provide you with the care you need, when you need it.  Your next appointment:   6 month(s)  The format for your next appointment:   In Person  Provider:   You may see Henry W Smith III, MD or one of the following Advanced Practice Providers on your designated Care Team:    Lori Gerhardt, NP  Laura Ingold, NP  Jill McDaniel, NP   Other Instructions   

## 2019-08-23 DIAGNOSIS — I498 Other specified cardiac arrhythmias: Secondary | ICD-10-CM | POA: Diagnosis not present

## 2019-08-23 DIAGNOSIS — K8020 Calculus of gallbladder without cholecystitis without obstruction: Secondary | ICD-10-CM | POA: Diagnosis not present

## 2019-08-23 DIAGNOSIS — K219 Gastro-esophageal reflux disease without esophagitis: Secondary | ICD-10-CM | POA: Diagnosis not present

## 2019-08-23 DIAGNOSIS — Z7901 Long term (current) use of anticoagulants: Secondary | ICD-10-CM | POA: Diagnosis not present

## 2019-08-23 DIAGNOSIS — N529 Male erectile dysfunction, unspecified: Secondary | ICD-10-CM | POA: Diagnosis not present

## 2019-08-23 DIAGNOSIS — J309 Allergic rhinitis, unspecified: Secondary | ICD-10-CM | POA: Diagnosis not present

## 2019-08-23 DIAGNOSIS — N1831 Chronic kidney disease, stage 3a: Secondary | ICD-10-CM | POA: Diagnosis not present

## 2019-08-23 DIAGNOSIS — N183 Chronic kidney disease, stage 3 unspecified: Secondary | ICD-10-CM | POA: Diagnosis not present

## 2019-08-23 DIAGNOSIS — M159 Polyosteoarthritis, unspecified: Secondary | ICD-10-CM | POA: Diagnosis not present

## 2019-09-17 ENCOUNTER — Ambulatory Visit (INDEPENDENT_AMBULATORY_CARE_PROVIDER_SITE_OTHER): Payer: Medicare HMO | Admitting: *Deleted

## 2019-09-17 DIAGNOSIS — I5022 Chronic systolic (congestive) heart failure: Secondary | ICD-10-CM | POA: Diagnosis not present

## 2019-09-17 LAB — CUP PACEART REMOTE DEVICE CHECK
Battery Remaining Longevity: 82 mo
Battery Voltage: 2.99 V
Brady Statistic AP VP Percent: 0.03 %
Brady Statistic AP VS Percent: 48.97 %
Brady Statistic AS VP Percent: 0.03 %
Brady Statistic AS VS Percent: 50.97 %
Brady Statistic RA Percent Paced: 48.85 %
Brady Statistic RV Percent Paced: 0.06 %
Date Time Interrogation Session: 20201228001604
HighPow Impedance: 65 Ohm
Implantable Lead Implant Date: 20170922
Implantable Lead Implant Date: 20170922
Implantable Lead Location: 753859
Implantable Lead Location: 753860
Implantable Lead Model: 5076
Implantable Pulse Generator Implant Date: 20170922
Lead Channel Impedance Value: 399 Ohm
Lead Channel Impedance Value: 456 Ohm
Lead Channel Impedance Value: 532 Ohm
Lead Channel Pacing Threshold Amplitude: 0.5 V
Lead Channel Pacing Threshold Amplitude: 0.75 V
Lead Channel Pacing Threshold Pulse Width: 0.4 ms
Lead Channel Pacing Threshold Pulse Width: 0.4 ms
Lead Channel Sensing Intrinsic Amplitude: 10.75 mV
Lead Channel Sensing Intrinsic Amplitude: 10.75 mV
Lead Channel Sensing Intrinsic Amplitude: 2.125 mV
Lead Channel Sensing Intrinsic Amplitude: 2.125 mV
Lead Channel Setting Pacing Amplitude: 2 V
Lead Channel Setting Pacing Amplitude: 2.5 V
Lead Channel Setting Pacing Pulse Width: 0.4 ms
Lead Channel Setting Sensing Sensitivity: 0.3 mV

## 2019-09-18 NOTE — Progress Notes (Signed)
ICD remote 

## 2019-09-20 DIAGNOSIS — K219 Gastro-esophageal reflux disease without esophagitis: Secondary | ICD-10-CM | POA: Diagnosis not present

## 2019-09-20 DIAGNOSIS — N529 Male erectile dysfunction, unspecified: Secondary | ICD-10-CM | POA: Diagnosis not present

## 2019-09-20 DIAGNOSIS — J309 Allergic rhinitis, unspecified: Secondary | ICD-10-CM | POA: Diagnosis not present

## 2019-09-20 DIAGNOSIS — N1831 Chronic kidney disease, stage 3a: Secondary | ICD-10-CM | POA: Diagnosis not present

## 2019-09-20 DIAGNOSIS — M159 Polyosteoarthritis, unspecified: Secondary | ICD-10-CM | POA: Diagnosis not present

## 2019-09-20 DIAGNOSIS — I498 Other specified cardiac arrhythmias: Secondary | ICD-10-CM | POA: Diagnosis not present

## 2019-09-20 DIAGNOSIS — M25561 Pain in right knee: Secondary | ICD-10-CM | POA: Diagnosis not present

## 2019-09-20 DIAGNOSIS — K8020 Calculus of gallbladder without cholecystitis without obstruction: Secondary | ICD-10-CM | POA: Diagnosis not present

## 2019-09-20 DIAGNOSIS — N183 Chronic kidney disease, stage 3 unspecified: Secondary | ICD-10-CM | POA: Diagnosis not present

## 2019-09-27 DIAGNOSIS — K8020 Calculus of gallbladder without cholecystitis without obstruction: Secondary | ICD-10-CM | POA: Diagnosis not present

## 2019-09-27 DIAGNOSIS — N1831 Chronic kidney disease, stage 3a: Secondary | ICD-10-CM | POA: Diagnosis not present

## 2019-09-27 DIAGNOSIS — Z791 Long term (current) use of non-steroidal anti-inflammatories (NSAID): Secondary | ICD-10-CM | POA: Diagnosis not present

## 2019-09-27 DIAGNOSIS — D5740 Sickle-cell thalassemia without crisis: Secondary | ICD-10-CM | POA: Diagnosis not present

## 2019-09-27 DIAGNOSIS — N183 Chronic kidney disease, stage 3 unspecified: Secondary | ICD-10-CM | POA: Diagnosis not present

## 2019-09-27 DIAGNOSIS — M159 Polyosteoarthritis, unspecified: Secondary | ICD-10-CM | POA: Diagnosis not present

## 2019-09-27 DIAGNOSIS — M25562 Pain in left knee: Secondary | ICD-10-CM | POA: Diagnosis not present

## 2019-09-27 DIAGNOSIS — J309 Allergic rhinitis, unspecified: Secondary | ICD-10-CM | POA: Diagnosis not present

## 2019-09-27 DIAGNOSIS — N529 Male erectile dysfunction, unspecified: Secondary | ICD-10-CM | POA: Diagnosis not present

## 2019-09-27 DIAGNOSIS — K219 Gastro-esophageal reflux disease without esophagitis: Secondary | ICD-10-CM | POA: Diagnosis not present

## 2019-09-28 ENCOUNTER — Telehealth: Payer: Self-pay

## 2019-09-28 NOTE — Telephone Encounter (Signed)
The pt states he sent a transmission on 09-26-2019 because he felt some irregular heart beats. I told him per the nurse note the transmission did not send any new alerts. The pt thanked me for the call.

## 2019-10-04 DIAGNOSIS — I739 Peripheral vascular disease, unspecified: Secondary | ICD-10-CM | POA: Diagnosis not present

## 2019-10-04 DIAGNOSIS — N529 Male erectile dysfunction, unspecified: Secondary | ICD-10-CM | POA: Diagnosis not present

## 2019-10-04 DIAGNOSIS — I498 Other specified cardiac arrhythmias: Secondary | ICD-10-CM | POA: Diagnosis not present

## 2019-10-04 DIAGNOSIS — K219 Gastro-esophageal reflux disease without esophagitis: Secondary | ICD-10-CM | POA: Diagnosis not present

## 2019-10-04 DIAGNOSIS — R791 Abnormal coagulation profile: Secondary | ICD-10-CM | POA: Diagnosis not present

## 2019-10-04 DIAGNOSIS — N1831 Chronic kidney disease, stage 3a: Secondary | ICD-10-CM | POA: Diagnosis not present

## 2019-10-04 DIAGNOSIS — M159 Polyosteoarthritis, unspecified: Secondary | ICD-10-CM | POA: Diagnosis not present

## 2019-10-04 DIAGNOSIS — J309 Allergic rhinitis, unspecified: Secondary | ICD-10-CM | POA: Diagnosis not present

## 2019-10-04 DIAGNOSIS — K8020 Calculus of gallbladder without cholecystitis without obstruction: Secondary | ICD-10-CM | POA: Diagnosis not present

## 2019-10-23 DIAGNOSIS — Z01818 Encounter for other preprocedural examination: Secondary | ICD-10-CM | POA: Diagnosis not present

## 2019-10-23 DIAGNOSIS — H40033 Anatomical narrow angle, bilateral: Secondary | ICD-10-CM | POA: Diagnosis not present

## 2019-10-23 DIAGNOSIS — H25812 Combined forms of age-related cataract, left eye: Secondary | ICD-10-CM | POA: Diagnosis not present

## 2019-10-25 DIAGNOSIS — M25561 Pain in right knee: Secondary | ICD-10-CM | POA: Diagnosis not present

## 2019-10-25 DIAGNOSIS — M159 Polyosteoarthritis, unspecified: Secondary | ICD-10-CM | POA: Diagnosis not present

## 2019-10-25 DIAGNOSIS — Z7901 Long term (current) use of anticoagulants: Secondary | ICD-10-CM | POA: Diagnosis not present

## 2019-10-25 DIAGNOSIS — K219 Gastro-esophageal reflux disease without esophagitis: Secondary | ICD-10-CM | POA: Diagnosis not present

## 2019-10-25 DIAGNOSIS — N529 Male erectile dysfunction, unspecified: Secondary | ICD-10-CM | POA: Diagnosis not present

## 2019-10-25 DIAGNOSIS — I498 Other specified cardiac arrhythmias: Secondary | ICD-10-CM | POA: Diagnosis not present

## 2019-10-25 DIAGNOSIS — N1831 Chronic kidney disease, stage 3a: Secondary | ICD-10-CM | POA: Diagnosis not present

## 2019-10-25 DIAGNOSIS — K8020 Calculus of gallbladder without cholecystitis without obstruction: Secondary | ICD-10-CM | POA: Diagnosis not present

## 2019-10-25 DIAGNOSIS — J309 Allergic rhinitis, unspecified: Secondary | ICD-10-CM | POA: Diagnosis not present

## 2019-10-30 DIAGNOSIS — I10 Essential (primary) hypertension: Secondary | ICD-10-CM | POA: Diagnosis not present

## 2019-10-30 DIAGNOSIS — Z79891 Long term (current) use of opiate analgesic: Secondary | ICD-10-CM | POA: Diagnosis not present

## 2019-10-30 DIAGNOSIS — H259 Unspecified age-related cataract: Secondary | ICD-10-CM | POA: Diagnosis not present

## 2019-10-30 DIAGNOSIS — Z7901 Long term (current) use of anticoagulants: Secondary | ICD-10-CM | POA: Diagnosis not present

## 2019-10-30 DIAGNOSIS — M1990 Unspecified osteoarthritis, unspecified site: Secondary | ICD-10-CM | POA: Diagnosis not present

## 2019-10-30 DIAGNOSIS — Z79899 Other long term (current) drug therapy: Secondary | ICD-10-CM | POA: Diagnosis not present

## 2019-10-30 DIAGNOSIS — H353131 Nonexudative age-related macular degeneration, bilateral, early dry stage: Secondary | ICD-10-CM | POA: Diagnosis not present

## 2019-10-30 DIAGNOSIS — H25812 Combined forms of age-related cataract, left eye: Secondary | ICD-10-CM | POA: Diagnosis not present

## 2019-10-30 DIAGNOSIS — E785 Hyperlipidemia, unspecified: Secondary | ICD-10-CM | POA: Diagnosis not present

## 2019-10-30 DIAGNOSIS — H52223 Regular astigmatism, bilateral: Secondary | ICD-10-CM | POA: Diagnosis not present

## 2019-11-09 DIAGNOSIS — M25562 Pain in left knee: Secondary | ICD-10-CM | POA: Diagnosis not present

## 2019-11-09 DIAGNOSIS — N183 Chronic kidney disease, stage 3 unspecified: Secondary | ICD-10-CM | POA: Diagnosis not present

## 2019-11-09 DIAGNOSIS — I498 Other specified cardiac arrhythmias: Secondary | ICD-10-CM | POA: Diagnosis not present

## 2019-11-09 DIAGNOSIS — J309 Allergic rhinitis, unspecified: Secondary | ICD-10-CM | POA: Diagnosis not present

## 2019-11-09 DIAGNOSIS — K8020 Calculus of gallbladder without cholecystitis without obstruction: Secondary | ICD-10-CM | POA: Diagnosis not present

## 2019-11-09 DIAGNOSIS — K219 Gastro-esophageal reflux disease without esophagitis: Secondary | ICD-10-CM | POA: Diagnosis not present

## 2019-11-09 DIAGNOSIS — N529 Male erectile dysfunction, unspecified: Secondary | ICD-10-CM | POA: Diagnosis not present

## 2019-11-09 DIAGNOSIS — M159 Polyosteoarthritis, unspecified: Secondary | ICD-10-CM | POA: Diagnosis not present

## 2019-11-09 DIAGNOSIS — N1831 Chronic kidney disease, stage 3a: Secondary | ICD-10-CM | POA: Diagnosis not present

## 2019-11-23 DIAGNOSIS — Z7901 Long term (current) use of anticoagulants: Secondary | ICD-10-CM | POA: Diagnosis not present

## 2019-11-23 DIAGNOSIS — N183 Chronic kidney disease, stage 3 unspecified: Secondary | ICD-10-CM | POA: Diagnosis not present

## 2019-11-23 DIAGNOSIS — K219 Gastro-esophageal reflux disease without esophagitis: Secondary | ICD-10-CM | POA: Diagnosis not present

## 2019-11-23 DIAGNOSIS — I498 Other specified cardiac arrhythmias: Secondary | ICD-10-CM | POA: Diagnosis not present

## 2019-11-23 DIAGNOSIS — I10 Essential (primary) hypertension: Secondary | ICD-10-CM | POA: Diagnosis not present

## 2019-11-23 DIAGNOSIS — N1831 Chronic kidney disease, stage 3a: Secondary | ICD-10-CM | POA: Diagnosis not present

## 2019-11-23 DIAGNOSIS — M159 Polyosteoarthritis, unspecified: Secondary | ICD-10-CM | POA: Diagnosis not present

## 2019-11-23 DIAGNOSIS — E118 Type 2 diabetes mellitus with unspecified complications: Secondary | ICD-10-CM | POA: Diagnosis not present

## 2019-11-23 DIAGNOSIS — K8020 Calculus of gallbladder without cholecystitis without obstruction: Secondary | ICD-10-CM | POA: Diagnosis not present

## 2019-11-23 DIAGNOSIS — N529 Male erectile dysfunction, unspecified: Secondary | ICD-10-CM | POA: Diagnosis not present

## 2019-11-23 DIAGNOSIS — E7800 Pure hypercholesterolemia, unspecified: Secondary | ICD-10-CM | POA: Diagnosis not present

## 2019-11-23 DIAGNOSIS — J309 Allergic rhinitis, unspecified: Secondary | ICD-10-CM | POA: Diagnosis not present

## 2019-12-07 DIAGNOSIS — N529 Male erectile dysfunction, unspecified: Secondary | ICD-10-CM | POA: Diagnosis not present

## 2019-12-07 DIAGNOSIS — J309 Allergic rhinitis, unspecified: Secondary | ICD-10-CM | POA: Diagnosis not present

## 2019-12-07 DIAGNOSIS — M159 Polyosteoarthritis, unspecified: Secondary | ICD-10-CM | POA: Diagnosis not present

## 2019-12-07 DIAGNOSIS — K8020 Calculus of gallbladder without cholecystitis without obstruction: Secondary | ICD-10-CM | POA: Diagnosis not present

## 2019-12-07 DIAGNOSIS — N183 Chronic kidney disease, stage 3 unspecified: Secondary | ICD-10-CM | POA: Diagnosis not present

## 2019-12-07 DIAGNOSIS — M25562 Pain in left knee: Secondary | ICD-10-CM | POA: Diagnosis not present

## 2019-12-07 DIAGNOSIS — K219 Gastro-esophageal reflux disease without esophagitis: Secondary | ICD-10-CM | POA: Diagnosis not present

## 2019-12-07 DIAGNOSIS — I10 Essential (primary) hypertension: Secondary | ICD-10-CM | POA: Diagnosis not present

## 2019-12-07 DIAGNOSIS — N1831 Chronic kidney disease, stage 3a: Secondary | ICD-10-CM | POA: Diagnosis not present

## 2019-12-12 ENCOUNTER — Other Ambulatory Visit: Payer: Self-pay | Admitting: Cardiology

## 2019-12-14 DIAGNOSIS — M25562 Pain in left knee: Secondary | ICD-10-CM | POA: Diagnosis not present

## 2019-12-14 DIAGNOSIS — I10 Essential (primary) hypertension: Secondary | ICD-10-CM | POA: Diagnosis not present

## 2019-12-14 DIAGNOSIS — J309 Allergic rhinitis, unspecified: Secondary | ICD-10-CM | POA: Diagnosis not present

## 2019-12-14 DIAGNOSIS — K219 Gastro-esophageal reflux disease without esophagitis: Secondary | ICD-10-CM | POA: Diagnosis not present

## 2019-12-14 DIAGNOSIS — K8020 Calculus of gallbladder without cholecystitis without obstruction: Secondary | ICD-10-CM | POA: Diagnosis not present

## 2019-12-14 DIAGNOSIS — N529 Male erectile dysfunction, unspecified: Secondary | ICD-10-CM | POA: Diagnosis not present

## 2019-12-14 DIAGNOSIS — N183 Chronic kidney disease, stage 3 unspecified: Secondary | ICD-10-CM | POA: Diagnosis not present

## 2019-12-14 DIAGNOSIS — N1831 Chronic kidney disease, stage 3a: Secondary | ICD-10-CM | POA: Diagnosis not present

## 2019-12-14 DIAGNOSIS — M159 Polyosteoarthritis, unspecified: Secondary | ICD-10-CM | POA: Diagnosis not present

## 2019-12-17 ENCOUNTER — Ambulatory Visit (INDEPENDENT_AMBULATORY_CARE_PROVIDER_SITE_OTHER): Payer: Medicare HMO | Admitting: *Deleted

## 2019-12-17 DIAGNOSIS — I5022 Chronic systolic (congestive) heart failure: Secondary | ICD-10-CM

## 2019-12-17 LAB — CUP PACEART REMOTE DEVICE CHECK
Battery Remaining Longevity: 79 mo
Battery Voltage: 2.99 V
Brady Statistic AP VP Percent: 0.03 %
Brady Statistic AP VS Percent: 39.96 %
Brady Statistic AS VP Percent: 0.03 %
Brady Statistic AS VS Percent: 59.98 %
Brady Statistic RA Percent Paced: 39.84 %
Brady Statistic RV Percent Paced: 0.06 %
Date Time Interrogation Session: 20210329033322
HighPow Impedance: 62 Ohm
Implantable Lead Implant Date: 20170922
Implantable Lead Implant Date: 20170922
Implantable Lead Location: 753859
Implantable Lead Location: 753860
Implantable Lead Model: 5076
Implantable Pulse Generator Implant Date: 20170922
Lead Channel Impedance Value: 399 Ohm
Lead Channel Impedance Value: 418 Ohm
Lead Channel Impedance Value: 475 Ohm
Lead Channel Pacing Threshold Amplitude: 0.625 V
Lead Channel Pacing Threshold Amplitude: 0.75 V
Lead Channel Pacing Threshold Pulse Width: 0.4 ms
Lead Channel Pacing Threshold Pulse Width: 0.4 ms
Lead Channel Sensing Intrinsic Amplitude: 11.875 mV
Lead Channel Sensing Intrinsic Amplitude: 11.875 mV
Lead Channel Sensing Intrinsic Amplitude: 2.75 mV
Lead Channel Sensing Intrinsic Amplitude: 2.75 mV
Lead Channel Setting Pacing Amplitude: 2 V
Lead Channel Setting Pacing Amplitude: 2.5 V
Lead Channel Setting Pacing Pulse Width: 0.4 ms
Lead Channel Setting Sensing Sensitivity: 0.3 mV

## 2019-12-17 NOTE — Progress Notes (Signed)
ICD Remote  

## 2019-12-28 DIAGNOSIS — N1831 Chronic kidney disease, stage 3a: Secondary | ICD-10-CM | POA: Diagnosis not present

## 2019-12-28 DIAGNOSIS — Z7901 Long term (current) use of anticoagulants: Secondary | ICD-10-CM | POA: Diagnosis not present

## 2019-12-28 DIAGNOSIS — N183 Chronic kidney disease, stage 3 unspecified: Secondary | ICD-10-CM | POA: Diagnosis not present

## 2019-12-28 DIAGNOSIS — K8020 Calculus of gallbladder without cholecystitis without obstruction: Secondary | ICD-10-CM | POA: Diagnosis not present

## 2019-12-28 DIAGNOSIS — I10 Essential (primary) hypertension: Secondary | ICD-10-CM | POA: Diagnosis not present

## 2019-12-28 DIAGNOSIS — N529 Male erectile dysfunction, unspecified: Secondary | ICD-10-CM | POA: Diagnosis not present

## 2019-12-28 DIAGNOSIS — J309 Allergic rhinitis, unspecified: Secondary | ICD-10-CM | POA: Diagnosis not present

## 2019-12-28 DIAGNOSIS — K219 Gastro-esophageal reflux disease without esophagitis: Secondary | ICD-10-CM | POA: Diagnosis not present

## 2019-12-28 DIAGNOSIS — M159 Polyosteoarthritis, unspecified: Secondary | ICD-10-CM | POA: Diagnosis not present

## 2020-01-01 DIAGNOSIS — Z7901 Long term (current) use of anticoagulants: Secondary | ICD-10-CM | POA: Diagnosis not present

## 2020-01-01 DIAGNOSIS — H52223 Regular astigmatism, bilateral: Secondary | ICD-10-CM | POA: Diagnosis not present

## 2020-01-01 DIAGNOSIS — Z952 Presence of prosthetic heart valve: Secondary | ICD-10-CM | POA: Diagnosis not present

## 2020-01-01 DIAGNOSIS — Z79899 Other long term (current) drug therapy: Secondary | ICD-10-CM | POA: Diagnosis not present

## 2020-01-01 DIAGNOSIS — M1990 Unspecified osteoarthritis, unspecified site: Secondary | ICD-10-CM | POA: Diagnosis not present

## 2020-01-01 DIAGNOSIS — H259 Unspecified age-related cataract: Secondary | ICD-10-CM | POA: Diagnosis not present

## 2020-01-01 DIAGNOSIS — E7800 Pure hypercholesterolemia, unspecified: Secondary | ICD-10-CM | POA: Diagnosis not present

## 2020-01-01 DIAGNOSIS — E1136 Type 2 diabetes mellitus with diabetic cataract: Secondary | ICD-10-CM | POA: Diagnosis not present

## 2020-01-01 DIAGNOSIS — H353131 Nonexudative age-related macular degeneration, bilateral, early dry stage: Secondary | ICD-10-CM | POA: Diagnosis not present

## 2020-01-01 DIAGNOSIS — H25811 Combined forms of age-related cataract, right eye: Secondary | ICD-10-CM | POA: Diagnosis not present

## 2020-01-11 DIAGNOSIS — J309 Allergic rhinitis, unspecified: Secondary | ICD-10-CM | POA: Diagnosis not present

## 2020-01-11 DIAGNOSIS — I10 Essential (primary) hypertension: Secondary | ICD-10-CM | POA: Diagnosis not present

## 2020-01-11 DIAGNOSIS — N1831 Chronic kidney disease, stage 3a: Secondary | ICD-10-CM | POA: Diagnosis not present

## 2020-01-11 DIAGNOSIS — K8020 Calculus of gallbladder without cholecystitis without obstruction: Secondary | ICD-10-CM | POA: Diagnosis not present

## 2020-01-11 DIAGNOSIS — M25561 Pain in right knee: Secondary | ICD-10-CM | POA: Diagnosis not present

## 2020-01-11 DIAGNOSIS — I498 Other specified cardiac arrhythmias: Secondary | ICD-10-CM | POA: Diagnosis not present

## 2020-01-11 DIAGNOSIS — N183 Chronic kidney disease, stage 3 unspecified: Secondary | ICD-10-CM | POA: Diagnosis not present

## 2020-01-11 DIAGNOSIS — K219 Gastro-esophageal reflux disease without esophagitis: Secondary | ICD-10-CM | POA: Diagnosis not present

## 2020-01-11 DIAGNOSIS — N529 Male erectile dysfunction, unspecified: Secondary | ICD-10-CM | POA: Diagnosis not present

## 2020-01-25 DIAGNOSIS — I10 Essential (primary) hypertension: Secondary | ICD-10-CM | POA: Diagnosis not present

## 2020-01-25 DIAGNOSIS — Z7901 Long term (current) use of anticoagulants: Secondary | ICD-10-CM | POA: Diagnosis not present

## 2020-01-25 DIAGNOSIS — N1831 Chronic kidney disease, stage 3a: Secondary | ICD-10-CM | POA: Diagnosis not present

## 2020-01-25 DIAGNOSIS — E663 Overweight: Secondary | ICD-10-CM | POA: Diagnosis not present

## 2020-01-25 DIAGNOSIS — Z6825 Body mass index (BMI) 25.0-25.9, adult: Secondary | ICD-10-CM | POA: Diagnosis not present

## 2020-01-25 DIAGNOSIS — N529 Male erectile dysfunction, unspecified: Secondary | ICD-10-CM | POA: Diagnosis not present

## 2020-02-01 DIAGNOSIS — K219 Gastro-esophageal reflux disease without esophagitis: Secondary | ICD-10-CM | POA: Diagnosis not present

## 2020-02-01 DIAGNOSIS — R791 Abnormal coagulation profile: Secondary | ICD-10-CM | POA: Diagnosis not present

## 2020-02-01 DIAGNOSIS — J309 Allergic rhinitis, unspecified: Secondary | ICD-10-CM | POA: Diagnosis not present

## 2020-02-01 DIAGNOSIS — I498 Other specified cardiac arrhythmias: Secondary | ICD-10-CM | POA: Diagnosis not present

## 2020-02-01 DIAGNOSIS — N529 Male erectile dysfunction, unspecified: Secondary | ICD-10-CM | POA: Diagnosis not present

## 2020-02-01 DIAGNOSIS — N1831 Chronic kidney disease, stage 3a: Secondary | ICD-10-CM | POA: Diagnosis not present

## 2020-02-01 DIAGNOSIS — I10 Essential (primary) hypertension: Secondary | ICD-10-CM | POA: Diagnosis not present

## 2020-02-01 DIAGNOSIS — K8020 Calculus of gallbladder without cholecystitis without obstruction: Secondary | ICD-10-CM | POA: Diagnosis not present

## 2020-02-01 DIAGNOSIS — N183 Chronic kidney disease, stage 3 unspecified: Secondary | ICD-10-CM | POA: Diagnosis not present

## 2020-02-08 DIAGNOSIS — K8020 Calculus of gallbladder without cholecystitis without obstruction: Secondary | ICD-10-CM | POA: Diagnosis not present

## 2020-02-08 DIAGNOSIS — I10 Essential (primary) hypertension: Secondary | ICD-10-CM | POA: Diagnosis not present

## 2020-02-08 DIAGNOSIS — M25561 Pain in right knee: Secondary | ICD-10-CM | POA: Diagnosis not present

## 2020-02-08 DIAGNOSIS — N183 Chronic kidney disease, stage 3 unspecified: Secondary | ICD-10-CM | POA: Diagnosis not present

## 2020-02-08 DIAGNOSIS — J309 Allergic rhinitis, unspecified: Secondary | ICD-10-CM | POA: Diagnosis not present

## 2020-02-08 DIAGNOSIS — N1831 Chronic kidney disease, stage 3a: Secondary | ICD-10-CM | POA: Diagnosis not present

## 2020-02-08 DIAGNOSIS — I498 Other specified cardiac arrhythmias: Secondary | ICD-10-CM | POA: Diagnosis not present

## 2020-02-08 DIAGNOSIS — N529 Male erectile dysfunction, unspecified: Secondary | ICD-10-CM | POA: Diagnosis not present

## 2020-02-08 DIAGNOSIS — K219 Gastro-esophageal reflux disease without esophagitis: Secondary | ICD-10-CM | POA: Diagnosis not present

## 2020-02-11 ENCOUNTER — Ambulatory Visit: Payer: Medicare HMO | Admitting: Interventional Cardiology

## 2020-02-11 NOTE — Progress Notes (Deleted)
Cardiology Office Note:    Date:  02/11/2020   ID:  [name], DOB 1945/12/06, MRN [id number]  PCP:  Ryan Nakai, MD  Cardiologist:  [name], MD   Referring MD: Ryan Nakai, MD   No chief complaint on file.   History of Present Illness:    Ryan [name] is a 74 y.o. 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  ***  Past Medical History:  Diagnosis Date  . Atrial fibrillation (Ryan [name])   . Cardiomyopathy, dilated (Ryan [name])   . Diabetes mellitus without complication (Ryan [name])   . Endocarditis   . Hypertension   . Peripheral vascular disease (Ryan [name])   . PVC's (premature ventricular contractions)   . SVT (supraventricular tachycardia)  long RP     Past Surgical History:  Procedure Laterality Date  . ABDOMINAL AORTAGRAM N/A 10/03/2012   Procedure: ABDOMINAL Maxcine Ham;  Surgeon: [name], MD;  Location: [facility] CATH LAB;  Service: Cardiovascular;  Laterality: N/A;  . CORONARY ANGIOGRAM  09/21/2012   Procedure: CORONARY ANGIOGRAM;  Surgeon: [name], MD;  Location: [facility] CATH LAB;  Service: Cardiovascular;;  . EP IMPLANTABLE DEVICE N/A 06/11/2016   Procedure: ICD Implant;  Surgeon: Will Meredith Leeds, MD;  Location: [place] CV LAB;  Service: Cardiovascular;  Laterality: N/A;  . EXTREMITY WIRE/PIN REMOVAL  09/14/2012   Procedure: REMOVAL K-WIRE/PIN EXTREMITY;  Surgeon: [name], MD;  Location: [place];  Service: Orthopedics;  Laterality: Right;  Right Foot  . I & D EXTREMITY  09/14/2012   Procedure: IRRIGATION AND DEBRIDEMENT EXTREMITY;  Surgeon: Tennis Must, MD;  Location: [place];  Service: Orthopedics;  Laterality: Right;  . INTRAOPERATIVE TRANSESOPHAGEAL ECHOCARDIOGRAM  09/26/2012   Procedure: INTRAOPERATIVE TRANSESOPHAGEAL  ECHOCARDIOGRAM;  Surgeon: [name], MD;  Location: [facility] OR;  Service: Open Heart Surgery;  Laterality: N/A;  . MITRAL VALVE REPLACEMENT  09/26/2012   Procedure: MITRAL VALVE (MV) REPLACEMENT;  Surgeon: [name], MD;  Location: [place] OR;  Service: Open Heart Surgery;  Laterality: N/A;  . RIGHT HEART CATHETERIZATION  09/21/2012   Procedure: RIGHT HEART CATH;  Surgeon: [name], MD;  Location: [facility] CATH LAB;  Service: Cardiovascular;;  . TEE WITHOUT CARDIOVERSION  09/18/2012   Procedure: TRANSESOPHAGEAL ECHOCARDIOGRAM (TEE);  Surgeon: [name], MD;  Location: [facility] ENDOSCOPY;  Service: Cardiovascular;  Laterality: N/A;    Current Medications: No outpatient medications have been marked as taking for the 02/11/20 encounter (Appointment) with Ryan Crome, MD.     Allergies:   Ryan [name] allergy]   Social History   Socioeconomic History  . Marital status: Legally Separated    Spouse name: Not on file  . Number of children: 3  . Years of education: Not on file  . Highest education level: Not on file  Occupational History  . Not on file  Tobacco Use  . Smoking status: Never Smoker  . Smokeless tobacco: Never Used  Substance and Sexual Activity  . Alcohol use: No    Alcohol/week: 0.0 standard drinks  . Drug use: No  . Sexual activity: Not on file  Other Topics Concern  . Not on file  Social History Narrative   Daughter lives with him.  Retired Advertising account planner   Social Determinants of Radio broadcast assistant Strain:   .  Difficulty of Paying Living Expenses:   Food Insecurity:   . Worried About Charity fundraiser in the Last Year:   . Arboriculturist in the Last Year:   Transportation Needs:   . Film/video editor (Medical):   Marland Kitchen Lack of Transportation (Non-Medical):   Physical Activity:   . Days of Exercise per Week:   . Minutes of Exercise per Session:   Stress:   . Feeling of Stress :   Social Connections:   . Frequency of Communication with Friends  and Family:   . Frequency of Social Gatherings with Friends and Family:   . Attends Religious Services:   . Active Member of Clubs or Organizations:   . Attends Archivist Meetings:   Marland Kitchen Marital Status:      Family History: The patient's family history includes Hypertension in his father and mother.  ROS:   Please see the history of present illness.    *** All other systems reviewed and are negative.  EKGs/Labs/Other Studies Reviewed:    The following studies were reviewed today: ***  EKG:  EKG ***  Recent Labs: 06/26/2019: ALT 30; Hemoglobin 14.3; Platelets 341; TSH 1.580 07/03/2019: BUN 35; Creatinine, Ser 1.52; Potassium 4.3; Sodium 141  Recent Lipid Panel    Component Value Date/Time   CHOL 121 09/16/2012 0520   TRIG 174 (H) 09/16/2012 0520   HDL 6 (L) 09/16/2012 0520   CHOLHDL 20.2 09/16/2012 0520   VLDL 35 09/16/2012 0520   LDLCALC 80 09/16/2012 0520    Physical Exam:    VS:  There were no vitals taken for this visit.    Wt Readings from Last 3 Encounters:  08/09/19 183 lb (83 kg)  07/03/19 181 lb (82.1 kg)  06/26/19 182 lb (82.6 kg)     GEN: ***. No acute distress HEENT: Normal NECK: No JVD. LYMPHATICS: No lymphadenopathy CARDIAC: *** RRR without murmur, gallop, or edema. VASCULAR: *** Normal Pulses. No bruits. RESPIRATORY:  Clear to auscultation without rales, wheezing or rhonchi  ABDOMEN: Soft, non-tender, non-distended, No pulsatile mass, MUSCULOSKELETAL: No deformity  SKIN: Warm and dry NEUROLOGIC:  Alert and oriented x 3 PSYCHIATRIC:  Normal affect   ASSESSMENT:    1. Status post mitral valve replacement with bioprosthetic valve   2. Paroxysmal atrial fibrillation (HCC)   3. Chronic systolic heart failure ([place])   4. Essential hypertension   5. ICD (implantable cardioverter-defibrillator) in place   6. Long term current use of antiarrhythmic drug   7. Educated about COVID-19 virus infection    PLAN:    In order of problems  listed above:  1. ***   Medication Adjustments/Labs and Tests Ordered: Current medicines are reviewed at length with the patient today.  Concerns regarding medicines are outlined above.  No orders of the defined types were placed in this encounter.  No orders of the defined types were placed in this encounter.   There are no Patient Instructions on file for this visit.   Signed, [name], MD  02/11/2020 8:46 AM    [place]

## 2020-02-15 DIAGNOSIS — I10 Essential (primary) hypertension: Secondary | ICD-10-CM | POA: Diagnosis not present

## 2020-02-15 DIAGNOSIS — N529 Male erectile dysfunction, unspecified: Secondary | ICD-10-CM | POA: Diagnosis not present

## 2020-02-15 DIAGNOSIS — Z0000 Encounter for general adult medical examination without abnormal findings: Secondary | ICD-10-CM | POA: Diagnosis not present

## 2020-02-15 DIAGNOSIS — Z1331 Encounter for screening for depression: Secondary | ICD-10-CM | POA: Diagnosis not present

## 2020-02-15 DIAGNOSIS — Z125 Encounter for screening for malignant neoplasm of prostate: Secondary | ICD-10-CM | POA: Diagnosis not present

## 2020-02-15 DIAGNOSIS — Z9181 History of falling: Secondary | ICD-10-CM | POA: Diagnosis not present

## 2020-02-15 DIAGNOSIS — J309 Allergic rhinitis, unspecified: Secondary | ICD-10-CM | POA: Diagnosis not present

## 2020-02-15 DIAGNOSIS — M25561 Pain in right knee: Secondary | ICD-10-CM | POA: Diagnosis not present

## 2020-02-15 DIAGNOSIS — E785 Hyperlipidemia, unspecified: Secondary | ICD-10-CM | POA: Diagnosis not present

## 2020-02-29 DIAGNOSIS — L309 Dermatitis, unspecified: Secondary | ICD-10-CM | POA: Diagnosis not present

## 2020-02-29 DIAGNOSIS — K219 Gastro-esophageal reflux disease without esophagitis: Secondary | ICD-10-CM | POA: Diagnosis not present

## 2020-02-29 DIAGNOSIS — N1831 Chronic kidney disease, stage 3a: Secondary | ICD-10-CM | POA: Diagnosis not present

## 2020-02-29 DIAGNOSIS — J309 Allergic rhinitis, unspecified: Secondary | ICD-10-CM | POA: Diagnosis not present

## 2020-02-29 DIAGNOSIS — I10 Essential (primary) hypertension: Secondary | ICD-10-CM | POA: Diagnosis not present

## 2020-02-29 DIAGNOSIS — K8020 Calculus of gallbladder without cholecystitis without obstruction: Secondary | ICD-10-CM | POA: Diagnosis not present

## 2020-02-29 DIAGNOSIS — N183 Chronic kidney disease, stage 3 unspecified: Secondary | ICD-10-CM | POA: Diagnosis not present

## 2020-02-29 DIAGNOSIS — I498 Other specified cardiac arrhythmias: Secondary | ICD-10-CM | POA: Diagnosis not present

## 2020-02-29 DIAGNOSIS — N529 Male erectile dysfunction, unspecified: Secondary | ICD-10-CM | POA: Diagnosis not present

## 2020-03-04 DIAGNOSIS — K219 Gastro-esophageal reflux disease without esophagitis: Secondary | ICD-10-CM | POA: Diagnosis not present

## 2020-03-04 DIAGNOSIS — R791 Abnormal coagulation profile: Secondary | ICD-10-CM | POA: Diagnosis not present

## 2020-03-04 DIAGNOSIS — I498 Other specified cardiac arrhythmias: Secondary | ICD-10-CM | POA: Diagnosis not present

## 2020-03-04 DIAGNOSIS — I10 Essential (primary) hypertension: Secondary | ICD-10-CM | POA: Diagnosis not present

## 2020-03-04 DIAGNOSIS — K8020 Calculus of gallbladder without cholecystitis without obstruction: Secondary | ICD-10-CM | POA: Diagnosis not present

## 2020-03-04 DIAGNOSIS — J309 Allergic rhinitis, unspecified: Secondary | ICD-10-CM | POA: Diagnosis not present

## 2020-03-04 DIAGNOSIS — N1831 Chronic kidney disease, stage 3a: Secondary | ICD-10-CM | POA: Diagnosis not present

## 2020-03-04 DIAGNOSIS — N529 Male erectile dysfunction, unspecified: Secondary | ICD-10-CM | POA: Diagnosis not present

## 2020-03-04 DIAGNOSIS — N183 Chronic kidney disease, stage 3 unspecified: Secondary | ICD-10-CM | POA: Diagnosis not present

## 2020-03-17 ENCOUNTER — Ambulatory Visit (INDEPENDENT_AMBULATORY_CARE_PROVIDER_SITE_OTHER): Payer: Medicare HMO | Admitting: *Deleted

## 2020-03-17 DIAGNOSIS — I5022 Chronic systolic (congestive) heart failure: Secondary | ICD-10-CM | POA: Diagnosis not present

## 2020-03-17 DIAGNOSIS — I480 Paroxysmal atrial fibrillation: Secondary | ICD-10-CM

## 2020-03-18 LAB — CUP PACEART REMOTE DEVICE CHECK
Battery Remaining Longevity: 74 mo
Battery Voltage: 2.99 V
Brady Statistic AP VP Percent: 0.03 %
Brady Statistic AP VS Percent: 25.71 %
Brady Statistic AS VP Percent: 0.04 %
Brady Statistic AS VS Percent: 74.22 %
Brady Statistic RA Percent Paced: 25.41 %
Brady Statistic RV Percent Paced: 0.07 %
Date Time Interrogation Session: 20210628012204
HighPow Impedance: 71 Ohm
Implantable Lead Implant Date: 20170922
Implantable Lead Implant Date: 20170922
Implantable Lead Location: 753859
Implantable Lead Location: 753860
Implantable Lead Model: 5076
Implantable Pulse Generator Implant Date: 20170922
Lead Channel Impedance Value: 361 Ohm
Lead Channel Impedance Value: 399 Ohm
Lead Channel Impedance Value: 475 Ohm
Lead Channel Pacing Threshold Amplitude: 0.5 V
Lead Channel Pacing Threshold Amplitude: 0.75 V
Lead Channel Pacing Threshold Pulse Width: 0.4 ms
Lead Channel Pacing Threshold Pulse Width: 0.4 ms
Lead Channel Sensing Intrinsic Amplitude: 0.75 mV
Lead Channel Sensing Intrinsic Amplitude: 0.75 mV
Lead Channel Sensing Intrinsic Amplitude: 9.625 mV
Lead Channel Sensing Intrinsic Amplitude: 9.625 mV
Lead Channel Setting Pacing Amplitude: 2 V
Lead Channel Setting Pacing Amplitude: 2.5 V
Lead Channel Setting Pacing Pulse Width: 0.4 ms
Lead Channel Setting Sensing Sensitivity: 0.3 mV

## 2020-03-19 NOTE — Progress Notes (Signed)
Remote ICD transmission.   

## 2020-03-27 DIAGNOSIS — M659 Synovitis and tenosynovitis, unspecified: Secondary | ICD-10-CM | POA: Diagnosis not present

## 2020-03-27 DIAGNOSIS — I498 Other specified cardiac arrhythmias: Secondary | ICD-10-CM | POA: Diagnosis not present

## 2020-03-27 DIAGNOSIS — N183 Chronic kidney disease, stage 3 unspecified: Secondary | ICD-10-CM | POA: Diagnosis not present

## 2020-03-27 DIAGNOSIS — I10 Essential (primary) hypertension: Secondary | ICD-10-CM | POA: Diagnosis not present

## 2020-03-27 DIAGNOSIS — K8020 Calculus of gallbladder without cholecystitis without obstruction: Secondary | ICD-10-CM | POA: Diagnosis not present

## 2020-03-27 DIAGNOSIS — N1831 Chronic kidney disease, stage 3a: Secondary | ICD-10-CM | POA: Diagnosis not present

## 2020-03-27 DIAGNOSIS — N529 Male erectile dysfunction, unspecified: Secondary | ICD-10-CM | POA: Diagnosis not present

## 2020-03-27 DIAGNOSIS — J309 Allergic rhinitis, unspecified: Secondary | ICD-10-CM | POA: Diagnosis not present

## 2020-03-27 DIAGNOSIS — K219 Gastro-esophageal reflux disease without esophagitis: Secondary | ICD-10-CM | POA: Diagnosis not present

## 2020-03-31 NOTE — Progress Notes (Signed)
Cardiology Office Note:    Date:  04/02/2020   ID:  KALEO CONDREY, DOB [birth year], MRN [id number]  PCP:  [name], MD  Cardiologist:  [name], MD   Referring MD: [name], MD   Chief Complaint  Patient presents with  . Atrial Fibrillation  . Cardiac Valve Problem    Mitral valve disease    History of Present Illness:    Ryan [name] is a 74 y.o. 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  Occasional left subcostal palpitation/flutter.  He feels those episodes of heart being out of rhythm.  It raises a question of whether he could be having diaphragm pacing.  He has not had neurological complaints.  He is trying to remain active.  [facility] has been somewhat spotty lately.  There is some dyspnea on exertion but it does not prevent taking care of his typical chores at home.  He occasionally feels rapid heart beating that can last seconds up to an hour.  He usually goes away quickly.  Past Medical History:  Diagnosis Date  . Atrial fibrillation ([place])   . Cardiomyopathy, dilated ([place])   . Diabetes mellitus without complication ([place])   . Endocarditis   . Hypertension   . Peripheral vascular disease ([place])   . PVC's (premature ventricular contractions)   . SVT (supraventricular tachycardia)  long RP     Past Surgical History:  Procedure Laterality Date  . ABDOMINAL AORTAGRAM N/A 10/03/2012   Procedure: ABDOMINAL Maxcine Ham;  Surgeon: [name], MD;  Location: [facility] CATH LAB;  Service: Cardiovascular;  Laterality: N/A;  . CORONARY ANGIOGRAM  09/21/2012   Procedure: CORONARY ANGIOGRAM;  Surgeon: [name], MD;  Location: [facility] CATH LAB;  Service: Cardiovascular;;  . EP IMPLANTABLE DEVICE N/A 06/11/2016   Procedure:  ICD Implant;  Surgeon: Will Meredith Leeds, MD;  Location: [place] CV LAB;  Service: Cardiovascular;  Laterality: N/A;  . EXTREMITY WIRE/PIN REMOVAL  09/14/2012   Procedure: REMOVAL K-WIRE/PIN EXTREMITY;  Surgeon: [name], MD;  Location: [place];  Service: Orthopedics;  Laterality: Right;  Right Foot  . I & D EXTREMITY  09/14/2012   Procedure: IRRIGATION AND DEBRIDEMENT EXTREMITY;  Surgeon: Tennis Must, MD;  Location: [place];  Service: Orthopedics;  Laterality: Right;  . INTRAOPERATIVE TRANSESOPHAGEAL ECHOCARDIOGRAM  09/26/2012   Procedure: INTRAOPERATIVE TRANSESOPHAGEAL ECHOCARDIOGRAM;  Surgeon: [name], MD;  Location: [facility] OR;  Service: Open Heart Surgery;  Laterality: N/A;  . MITRAL VALVE REPLACEMENT  09/26/2012   Procedure: MITRAL VALVE (MV) REPLACEMENT;  Surgeon: [name], MD;  Location: [place] OR;  Service: Open Heart Surgery;  Laterality: N/A;  . RIGHT HEART CATHETERIZATION  09/21/2012   Procedure: RIGHT HEART CATH;  Surgeon: [name], MD;  Location: [facility] CATH LAB;  Service: Cardiovascular;;  . TEE WITHOUT CARDIOVERSION  09/18/2012   Procedure: TRANSESOPHAGEAL ECHOCARDIOGRAM (TEE);  Surgeon: [name], MD;  Location: [facility] ENDOSCOPY;  Service: Cardiovascular;  Laterality: N/A;    Current Medications: Current Meds  Medication Sig  . atorvastatin (LIPITOR) 40 MG tablet Take 1 tablet (40 mg total) by mouth daily at 6 PM.  . HYDROcodone-acetaminophen (NORCO/VICODIN) 5-325 MG per tablet Take 1 tablet by mouth 2 (two) times daily as needed for moderate pain.   . metoprolol succinate (TOPROL-XL)  25 MG 24 hr tablet TAKE 1 TABLET DAILY  . mexiletine (MEXITIL) 250 MG capsule TAKE 1 CAPSULE (250 MG TOTAL) BY MOUTH 2 (TWO) TIMES DAILY.  . Multiple Vitamins-Minerals (CENTRUM SILVER PO) Take 1 tablet by mouth 3 (three) times a week.  . pantoprazole (PROTONIX) 40 MG tablet Take 40 mg by mouth daily.   . potassium chloride SA (K-DUR,KLOR-CON) 20 MEQ tablet Take 20 mEq by mouth daily.    . tamsulosin (FLOMAX) 0.4 MG CAPS capsule Take 1 capsule by mouth daily.  Marland Kitchen triamcinolone cream (KENALOG) 0.5 % Apply 1 application topically as needed (SKIN).   . valsartan-hydrochlorothiazide (DIOVAN HCT) 160-25 MG tablet Take 1 tablet by mouth daily.     Allergies:   [name] allergy]   Social History   Socioeconomic History  . Marital status: Legally Separated    Spouse name: Not on file  . Number of children: 3  . Years of education: Not on file  . Highest education level: Not on file  Occupational History  . Not on file  Tobacco Use  . Smoking status: Never Smoker  . Smokeless tobacco: Never Used  Vaping Use  . Vaping Use: Never used  Substance and Sexual Activity  . Alcohol use: No    Alcohol/week: 0.0 standard drinks  . Drug use: No  . Sexual activity: Not on file  Other Topics Concern  . Not on file  Social History Narrative   Daughter lives with him.  Retired Advertising account planner   Social Determinants of Radio broadcast assistant Strain:   . Difficulty of Paying Living Expenses:   Food Insecurity:   . Worried About Charity fundraiser in the Last Year:   . Arboriculturist in the Last Year:   Transportation Needs:   . Film/video editor (Medical):   Marland Kitchen Lack of Transportation (Non-Medical):   Physical Activity:   . Days of Exercise per Week:   . Minutes of Exercise per Session:   Stress:   . Feeling of Stress :   Social Connections:   . Frequency of Communication with Friends and Family:   . Frequency of Social Gatherings with Friends and Family:   . Attends Religious Services:   . Active Member of Clubs or Organizations:   . Attends Archivist Meetings:   Marland Kitchen Marital Status:      Family History: The patient's family history includes Hypertension in his father and mother.  ROS:   Please see the history of present illness.    No new symptoms.  No syncope.  No blood in the urine or stool.  All other systems reviewed and are  negative.  EKGs/Labs/Other Studies Reviewed:    The following studies were reviewed today:  Echocardiogram March 2020 IMPRESSIONS    1. Left ventricular ejection fraction, by visual estimation, is 30 to  35%. The left ventricle has normal function. Normal left ventricular size.  There is no left ventricular hypertrophy.  2. Global right ventricle has normal systolic function.The right  ventricular size is moderately enlarged. No increase in right ventricular  wall thickness.  3. Left atrial size was normal.  4. Right atrial size was normal.  5. The mitral valve has been repaired/replaced. No evidence of mitral  valve regurgitation. No evidence of mitral stenosis.  6. Bioprosthetic mitral valve.   Mean gradient 39mmHg.  7. The tricuspid valve is normal in structure. Tricuspid valve  regurgitation is mild.  8. The aortic valve is  normal in structure. Aortic valve regurgitation  was not visualized by color flow Doppler. Structurally normal aortic  valve, with no evidence of sclerosis or stenosis.  9. The pulmonic valve was normal in structure. Pulmonic valve  regurgitation is not visualized by color flow Doppler.  10. Moderately elevated pulmonary artery systolic pressure.  11. A pacer wire is visualized in the RA and RV.  12. The inferior vena cava is normal in size with greater than 50%  respiratory variability, suggesting right atrial pressure of 3 mmHg.  EKG:  EKG no new data  Recent Labs: 06/26/2019: ALT 30; Hemoglobin 14.3; Platelets 341; TSH 1.580 07/03/2019: BUN 35; Creatinine, Ser 1.52; Potassium 4.3; Sodium 141  Recent Lipid Panel    Component Value Date/Time   CHOL 121 09/16/2012 0520   TRIG 174 (H) 09/16/2012 0520   HDL 6 (L) 09/16/2012 0520   CHOLHDL 20.2 09/16/2012 0520   VLDL 35 09/16/2012 0520   LDLCALC 80 09/16/2012 0520    Physical Exam:    VS:  BP 128/80   Pulse 91   Ht 6' (1.829 m)   Wt 182 lb 9.6 oz (82.8 kg)   SpO2 96%   BMI 24.77  kg/m     Wt Readings from Last 3 Encounters:  04/02/20 182 lb 9.6 oz (82.8 kg)  08/09/19 183 lb (83 kg)  07/03/19 181 lb (82.1 kg)     GEN: Slender and compatible with age. No acute distress HEENT: Normal NECK: No JVD. LYMPHATICS: No lymphadenopathy CARDIAC: Irregular but with majority RRR without murmur, gallop, or edema. VASCULAR:  Normal Pulses. No bruits. RESPIRATORY:  Clear to auscultation without rales, wheezing or rhonchi  ABDOMEN: Soft, non-tender, non-distended, No pulsatile mass, MUSCULOSKELETAL: No deformity  SKIN: Warm and dry NEUROLOGIC:  Alert and oriented x 3 PSYCHIATRIC:  Normal affect   ASSESSMENT:    1. Chronic systolic heart failure (HCC)   2. Paroxysmal atrial fibrillation (HCC)   3. Status post mitral valve replacement with bioprosthetic valve   4. Essential hypertension   5. ICD (implantable cardioverter-defibrillator) in place   6. Long term current use of antiarrhythmic drug   7. Educated about COVID-19 virus infection    PLAN:    In order of problems listed above:  1. Systolic heart failure regimen should be continued including valsartan HCTZ, Toprol-XL, and perhaps consider mineralocorticoid receptor antagonist and dapagliflozin at some point in the future. 2. Continue anticoagulation therapy. 3. Valve function appeared reasonable on recent echo less than a year ago. 4. Blood pressure is adequately controlled as listed.  Continue Toprol and Diovan HCT. 5. Followed in the device clinic ([place]) 6. Is on Mexitil for premature ventricular contractions. 7. Received a Covid vaccine.  Did not develop Covid during the pandemic.  We need to repeat his echocardiogram between now and follow-up in 6 months.  Depending upon results, consider dapagliflozin/switching to [facility] from [place] potentially adding MRA.   Medication Adjustments/Labs and Tests Ordered: Current medicines are reviewed at length with the patient today.  Concerns regarding medicines  are outlined above.  No orders of the defined types were placed in this encounter.  No orders of the defined types were placed in this encounter.   Patient Instructions  Medication Instructions:  Your physician recommends that you continue on your current medications as directed. Please refer to the Current Medication list given to you today.  *If you need a refill on your cardiac medications before your next appointment, please call your pharmacy*  Lab Work: None If you have labs (blood work) drawn today and your tests are completely normal, you will receive your results only by: Marland Kitchen MyChart Message (if you have MyChart) OR . A paper copy in the mail If you have any lab test that is abnormal or we need to change your treatment, we will call you to review the results.   Testing/Procedures: None   Follow-Up: At [facility], you and your health needs are our priority.  As part of our continuing mission to provide you with exceptional heart care, we have created designated Provider Care Teams.  These Care Teams include your primary Cardiologist (physician) and Advanced Practice Providers (APPs -  Physician Assistants and Nurse Practitioners) who all work together to provide you with the care you need, when you need it.  We recommend signing up for the patient portal called "MyChart".  Sign up information is provided on this After Visit Summary.  MyChart is used to connect with patients for Virtual Visits (Telemedicine).  Patients are able to view lab/test results, encounter notes, upcoming appointments, etc.  Non-urgent messages can be sent to your provider as well.   To learn more about what you can do with MyChart, go to [email or web address].    Your next appointment:   6 month(s)  The format for your next appointment:   In Person  Provider:   You may see [name], MD or one of the following Advanced Practice Providers on your designated Care Team:    Truitt Merle, NP  Cecilie Kicks, NP  Kathyrn Drown, NP    Other Instructions      Signed, [name], MD  04/02/2020 3:36 PM    [place]

## 2020-04-01 DIAGNOSIS — K219 Gastro-esophageal reflux disease without esophagitis: Secondary | ICD-10-CM | POA: Diagnosis not present

## 2020-04-01 DIAGNOSIS — I10 Essential (primary) hypertension: Secondary | ICD-10-CM | POA: Diagnosis not present

## 2020-04-01 DIAGNOSIS — N183 Chronic kidney disease, stage 3 unspecified: Secondary | ICD-10-CM | POA: Diagnosis not present

## 2020-04-01 DIAGNOSIS — K8020 Calculus of gallbladder without cholecystitis without obstruction: Secondary | ICD-10-CM | POA: Diagnosis not present

## 2020-04-01 DIAGNOSIS — E7800 Pure hypercholesterolemia, unspecified: Secondary | ICD-10-CM | POA: Diagnosis not present

## 2020-04-01 DIAGNOSIS — N1831 Chronic kidney disease, stage 3a: Secondary | ICD-10-CM | POA: Diagnosis not present

## 2020-04-01 DIAGNOSIS — E118 Type 2 diabetes mellitus with unspecified complications: Secondary | ICD-10-CM | POA: Diagnosis not present

## 2020-04-01 DIAGNOSIS — J309 Allergic rhinitis, unspecified: Secondary | ICD-10-CM | POA: Diagnosis not present

## 2020-04-01 DIAGNOSIS — Z7901 Long term (current) use of anticoagulants: Secondary | ICD-10-CM | POA: Diagnosis not present

## 2020-04-01 DIAGNOSIS — N529 Male erectile dysfunction, unspecified: Secondary | ICD-10-CM | POA: Diagnosis not present

## 2020-04-01 DIAGNOSIS — B351 Tinea unguium: Secondary | ICD-10-CM | POA: Diagnosis not present

## 2020-04-02 ENCOUNTER — Other Ambulatory Visit: Payer: Self-pay

## 2020-04-02 ENCOUNTER — Ambulatory Visit: Payer: Medicare HMO | Admitting: Interventional Cardiology

## 2020-04-02 ENCOUNTER — Encounter: Payer: Self-pay | Admitting: Interventional Cardiology

## 2020-04-02 VITALS — BP 128/80 | HR 91 | Ht 72.0 in | Wt 182.6 lb

## 2020-04-02 DIAGNOSIS — Z95810 Presence of automatic (implantable) cardiac defibrillator: Secondary | ICD-10-CM

## 2020-04-02 DIAGNOSIS — Z7189 Other specified counseling: Secondary | ICD-10-CM

## 2020-04-02 DIAGNOSIS — I779 Disorder of arteries and arterioles, unspecified: Secondary | ICD-10-CM

## 2020-04-02 DIAGNOSIS — Z953 Presence of xenogenic heart valve: Secondary | ICD-10-CM | POA: Diagnosis not present

## 2020-04-02 DIAGNOSIS — I5022 Chronic systolic (congestive) heart failure: Secondary | ICD-10-CM

## 2020-04-02 DIAGNOSIS — Z79899 Other long term (current) drug therapy: Secondary | ICD-10-CM | POA: Diagnosis not present

## 2020-04-02 DIAGNOSIS — I10 Essential (primary) hypertension: Secondary | ICD-10-CM

## 2020-04-02 DIAGNOSIS — I480 Paroxysmal atrial fibrillation: Secondary | ICD-10-CM | POA: Diagnosis not present

## 2020-04-02 NOTE — Patient Instructions (Signed)

## 2020-04-07 DIAGNOSIS — E118 Type 2 diabetes mellitus with unspecified complications: Secondary | ICD-10-CM | POA: Diagnosis not present

## 2020-04-07 DIAGNOSIS — N529 Male erectile dysfunction, unspecified: Secondary | ICD-10-CM | POA: Diagnosis not present

## 2020-04-07 DIAGNOSIS — R791 Abnormal coagulation profile: Secondary | ICD-10-CM | POA: Diagnosis not present

## 2020-04-07 DIAGNOSIS — N1831 Chronic kidney disease, stage 3a: Secondary | ICD-10-CM | POA: Diagnosis not present

## 2020-04-07 DIAGNOSIS — I10 Essential (primary) hypertension: Secondary | ICD-10-CM | POA: Diagnosis not present

## 2020-04-07 DIAGNOSIS — E7800 Pure hypercholesterolemia, unspecified: Secondary | ICD-10-CM | POA: Diagnosis not present

## 2020-04-07 DIAGNOSIS — K8020 Calculus of gallbladder without cholecystitis without obstruction: Secondary | ICD-10-CM | POA: Diagnosis not present

## 2020-04-07 DIAGNOSIS — K219 Gastro-esophageal reflux disease without esophagitis: Secondary | ICD-10-CM | POA: Diagnosis not present

## 2020-04-07 DIAGNOSIS — J309 Allergic rhinitis, unspecified: Secondary | ICD-10-CM | POA: Diagnosis not present

## 2020-04-09 ENCOUNTER — Other Ambulatory Visit: Payer: Self-pay

## 2020-04-09 ENCOUNTER — Ambulatory Visit (HOSPITAL_COMMUNITY)
Admission: RE | Admit: 2020-04-09 | Discharge: 2020-04-09 | Disposition: A | Discharge status: Home / Self Care | Payer: Medicare HMO | Source: Ambulatory Visit | Attending: Vascular Surgery | Admitting: Vascular Surgery

## 2020-04-09 ENCOUNTER — Ambulatory Visit (INDEPENDENT_AMBULATORY_CARE_PROVIDER_SITE_OTHER): Payer: Medicare HMO | Admitting: Physician Assistant

## 2020-04-09 VITALS — BP 125/74 | HR 63 | Temp 97.7°F | Resp 20 | Ht 72.0 in | Wt 182.3 lb

## 2020-04-09 DIAGNOSIS — I779 Disorder of arteries and arterioles, unspecified: Secondary | ICD-10-CM | POA: Diagnosis not present

## 2020-04-09 DIAGNOSIS — I739 Peripheral vascular disease, unspecified: Secondary | ICD-10-CM

## 2020-04-09 NOTE — Progress Notes (Signed)
Established Patient   History of Present Illness   Ryan [name] is a 75 y.o. ([birth year]) male who presents for evaluation of PAD.  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  He also had a right great toe ulceration that he was able to heal with wound care around that time.  He returns for surveillance today.  He denies any nonhealing wounds, rest pain, or claudication.  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  Patient states he has an appointment for a podiatrist for care of his toenails.  He is on a statin daily.  He is also taking Coumadin daily for atrial fibrillation.  The patient's PMH, PSH, SH, and FamHx were reviewed and are unchanged from prior visits.  Current Outpatient Medications  Medication Sig Dispense Refill  . atorvastatin (LIPITOR) 40 MG tablet Take 1 tablet (40 mg total) by mouth daily at 6 PM. 30 tablet 5  . HYDROcodone-acetaminophen (NORCO/VICODIN) 5-325 MG per tablet Take 1 tablet by mouth 2 (two) times daily as needed for moderate pain.     . metoprolol succinate (TOPROL-XL) 25 MG 24 hr tablet TAKE 1 TABLET DAILY 90 tablet 3  . mexiletine (MEXITIL) 250 MG capsule TAKE 1 CAPSULE (250 MG TOTAL) BY MOUTH 2 (TWO) TIMES DAILY. 180 capsule 1  . Multiple Vitamins-Minerals (CENTRUM SILVER PO) Take 1 tablet by mouth 3 (three) times a week.    . pantoprazole (PROTONIX) 40 MG tablet Take 40 mg by mouth daily.     . potassium chloride SA (K-DUR,KLOR-CON) 20 MEQ tablet Take 20 mEq by mouth daily.     . tamsulosin (FLOMAX) 0.4 MG CAPS capsule Take 1 capsule by mouth daily.    Marland Kitchen triamcinolone cream (KENALOG) 0.5 % Apply 1 application topically as needed (SKIN).     . valsartan-hydrochlorothiazide (DIOVAN HCT) 160-25 MG tablet Take 1 tablet by mouth daily. 90 tablet 3  . Warfarin Sodium (COUMADIN PO) Take by mouth. Take as directed     No current facility-administered  medications for this visit.    REVIEW OF SYSTEMS (negative unless checked):   Cardiac:  []  Chest pain or chest pressure? []  Shortness of breath upon activity? []  Shortness of breath when lying flat? []  Irregular heart rhythm?  Vascular:  []  Pain in calf, thigh, or hip brought on by walking? []  Pain in feet at night that wakes you up from your sleep? []  Blood clot in your veins? []  Leg swelling?  Pulmonary:  []  Oxygen at home? []  Productive cough? []  Wheezing?  Neurologic:  []  Sudden weakness in arms or legs? []  Sudden numbness in arms or legs? []  Sudden onset of difficult speaking or slurred speech? []  Temporary loss of vision in one eye? []  Problems with dizziness?  Gastrointestinal:  []  Blood in stool? []  Vomited blood?  Genitourinary:  []  Burning when urinating? []  Blood in urine?  Psychiatric:  []  Major depression  Hematologic:  []  Bleeding problems? []  Problems with blood clotting?  Dermatologic:  []  Rashes or ulcers?  Constitutional:  []  Fever or chills?  Ear/Nose/Throat:  []  Change in hearing? []  Nose bleeds? []  Sore throat?  Musculoskeletal:  [x]  Back pain? [x]  Joint pain? []  Muscle pain?   Physical Examination   Vitals:   04/09/20 1334  BP: 125/74  Pulse: 63  Resp: 20  Temp: 97.7 F (36.5 C)  TempSrc: Temporal  SpO2: 100%  Weight: 182 lb 4.8 oz (82.7 kg)  Height: 6' (1.829 m)   Body mass index is 24.72 kg/m.  General:  WDWN in NAD; vital signs documented above Gait: Not observed HENT: WNL, normocephalic Pulmonary: normal non-labored breathing , without Rales, rhonchi,  wheezing Cardiac: regular HR Abdomen: soft, NT, no masses Skin: without rashes Extremities: without ischemic changes, without Gangrene , without cellulitis; without open wounds; feet symmetrically warm to touch Musculoskeletal: no muscle wasting or atrophy  Neurologic: A&O X 3;  No focal weakness or paresthesias are detected Psychiatric:  The pt has Normal  affect.  Non-Invasive Vascular Imaging    ABI  ABI/TBIToday's ABIToday's TBIPrevious ABIPrevious TBI  +-------+-----------+-----------+------------+------------+  Right 0.86    0     0.98    0.48      +-------+-----------+-----------+------------+------------+  Left  [place]     0.44    [place]     0.61       Medical Decision Making    Ryan [name] is a 74 y.o. male who presents to go over vascular studies related to PAD  Patient with known right popliteal occlusion however no rest pain or nonhealing wounds No indication for revascularization of right popliteal artery at this time Continue statin daily Encouraged patient to remain active and walk as much as possible Recheck ABIs in 1 year Call/return to office sooner if rest pain or nonhealing wounds develop   Ryan Ligas PA-C [facility] Office: [facility] MD: Scot Dock

## 2020-04-14 DIAGNOSIS — K219 Gastro-esophageal reflux disease without esophagitis: Secondary | ICD-10-CM | POA: Diagnosis not present

## 2020-04-14 DIAGNOSIS — I10 Essential (primary) hypertension: Secondary | ICD-10-CM | POA: Diagnosis not present

## 2020-04-14 DIAGNOSIS — E118 Type 2 diabetes mellitus with unspecified complications: Secondary | ICD-10-CM | POA: Diagnosis not present

## 2020-04-14 DIAGNOSIS — Z7901 Long term (current) use of anticoagulants: Secondary | ICD-10-CM | POA: Diagnosis not present

## 2020-04-14 DIAGNOSIS — J309 Allergic rhinitis, unspecified: Secondary | ICD-10-CM | POA: Diagnosis not present

## 2020-04-14 DIAGNOSIS — M159 Polyosteoarthritis, unspecified: Secondary | ICD-10-CM | POA: Diagnosis not present

## 2020-04-14 DIAGNOSIS — E7800 Pure hypercholesterolemia, unspecified: Secondary | ICD-10-CM | POA: Diagnosis not present

## 2020-04-14 DIAGNOSIS — N529 Male erectile dysfunction, unspecified: Secondary | ICD-10-CM | POA: Diagnosis not present

## 2020-04-14 DIAGNOSIS — N1831 Chronic kidney disease, stage 3a: Secondary | ICD-10-CM | POA: Diagnosis not present

## 2020-04-23 DIAGNOSIS — B351 Tinea unguium: Secondary | ICD-10-CM | POA: Diagnosis not present

## 2020-04-23 DIAGNOSIS — I739 Peripheral vascular disease, unspecified: Secondary | ICD-10-CM | POA: Diagnosis not present

## 2020-04-23 DIAGNOSIS — J449 Chronic obstructive pulmonary disease, unspecified: Secondary | ICD-10-CM | POA: Diagnosis not present

## 2020-05-09 ENCOUNTER — Other Ambulatory Visit: Payer: Self-pay | Admitting: Cardiology

## 2020-05-13 DIAGNOSIS — N1831 Chronic kidney disease, stage 3a: Secondary | ICD-10-CM | POA: Diagnosis not present

## 2020-05-13 DIAGNOSIS — I10 Essential (primary) hypertension: Secondary | ICD-10-CM | POA: Diagnosis not present

## 2020-05-13 DIAGNOSIS — M25552 Pain in left hip: Secondary | ICD-10-CM | POA: Diagnosis not present

## 2020-05-13 DIAGNOSIS — Z7901 Long term (current) use of anticoagulants: Secondary | ICD-10-CM | POA: Diagnosis not present

## 2020-05-13 DIAGNOSIS — M159 Polyosteoarthritis, unspecified: Secondary | ICD-10-CM | POA: Diagnosis not present

## 2020-05-13 DIAGNOSIS — N529 Male erectile dysfunction, unspecified: Secondary | ICD-10-CM | POA: Diagnosis not present

## 2020-05-13 DIAGNOSIS — E118 Type 2 diabetes mellitus with unspecified complications: Secondary | ICD-10-CM | POA: Diagnosis not present

## 2020-05-13 DIAGNOSIS — E7800 Pure hypercholesterolemia, unspecified: Secondary | ICD-10-CM | POA: Diagnosis not present

## 2020-05-13 DIAGNOSIS — J309 Allergic rhinitis, unspecified: Secondary | ICD-10-CM | POA: Diagnosis not present

## 2020-05-19 DIAGNOSIS — N529 Male erectile dysfunction, unspecified: Secondary | ICD-10-CM | POA: Diagnosis not present

## 2020-05-19 DIAGNOSIS — E118 Type 2 diabetes mellitus with unspecified complications: Secondary | ICD-10-CM | POA: Diagnosis not present

## 2020-05-19 DIAGNOSIS — N1831 Chronic kidney disease, stage 3a: Secondary | ICD-10-CM | POA: Diagnosis not present

## 2020-05-19 DIAGNOSIS — J309 Allergic rhinitis, unspecified: Secondary | ICD-10-CM | POA: Diagnosis not present

## 2020-05-19 DIAGNOSIS — I498 Other specified cardiac arrhythmias: Secondary | ICD-10-CM | POA: Diagnosis not present

## 2020-05-19 DIAGNOSIS — K219 Gastro-esophageal reflux disease without esophagitis: Secondary | ICD-10-CM | POA: Diagnosis not present

## 2020-05-19 DIAGNOSIS — N183 Chronic kidney disease, stage 3 unspecified: Secondary | ICD-10-CM | POA: Diagnosis not present

## 2020-05-19 DIAGNOSIS — K8020 Calculus of gallbladder without cholecystitis without obstruction: Secondary | ICD-10-CM | POA: Diagnosis not present

## 2020-05-19 DIAGNOSIS — M25552 Pain in left hip: Secondary | ICD-10-CM | POA: Diagnosis not present

## 2020-06-03 DIAGNOSIS — R5382 Chronic fatigue, unspecified: Secondary | ICD-10-CM | POA: Diagnosis not present

## 2020-06-03 DIAGNOSIS — E118 Type 2 diabetes mellitus with unspecified complications: Secondary | ICD-10-CM | POA: Diagnosis not present

## 2020-06-03 DIAGNOSIS — N529 Male erectile dysfunction, unspecified: Secondary | ICD-10-CM | POA: Diagnosis not present

## 2020-06-03 DIAGNOSIS — I739 Peripheral vascular disease, unspecified: Secondary | ICD-10-CM | POA: Diagnosis not present

## 2020-06-03 DIAGNOSIS — K219 Gastro-esophageal reflux disease without esophagitis: Secondary | ICD-10-CM | POA: Diagnosis not present

## 2020-06-03 DIAGNOSIS — I498 Other specified cardiac arrhythmias: Secondary | ICD-10-CM | POA: Diagnosis not present

## 2020-06-03 DIAGNOSIS — N1831 Chronic kidney disease, stage 3a: Secondary | ICD-10-CM | POA: Diagnosis not present

## 2020-06-03 DIAGNOSIS — J309 Allergic rhinitis, unspecified: Secondary | ICD-10-CM | POA: Diagnosis not present

## 2020-06-03 DIAGNOSIS — K8020 Calculus of gallbladder without cholecystitis without obstruction: Secondary | ICD-10-CM | POA: Diagnosis not present

## 2020-06-04 ENCOUNTER — Other Ambulatory Visit: Payer: Self-pay | Admitting: Interventional Cardiology

## 2020-06-09 ENCOUNTER — Encounter: Payer: Self-pay | Admitting: *Deleted

## 2020-06-11 DIAGNOSIS — E118 Type 2 diabetes mellitus with unspecified complications: Secondary | ICD-10-CM | POA: Diagnosis not present

## 2020-06-11 DIAGNOSIS — Z7901 Long term (current) use of anticoagulants: Secondary | ICD-10-CM | POA: Diagnosis not present

## 2020-06-11 DIAGNOSIS — K219 Gastro-esophageal reflux disease without esophagitis: Secondary | ICD-10-CM | POA: Diagnosis not present

## 2020-06-11 DIAGNOSIS — D5740 Sickle-cell thalassemia without crisis: Secondary | ICD-10-CM | POA: Diagnosis not present

## 2020-06-11 DIAGNOSIS — I498 Other specified cardiac arrhythmias: Secondary | ICD-10-CM | POA: Diagnosis not present

## 2020-06-11 DIAGNOSIS — N1831 Chronic kidney disease, stage 3a: Secondary | ICD-10-CM | POA: Diagnosis not present

## 2020-06-11 DIAGNOSIS — J309 Allergic rhinitis, unspecified: Secondary | ICD-10-CM | POA: Diagnosis not present

## 2020-06-11 DIAGNOSIS — I739 Peripheral vascular disease, unspecified: Secondary | ICD-10-CM | POA: Diagnosis not present

## 2020-06-11 DIAGNOSIS — N529 Male erectile dysfunction, unspecified: Secondary | ICD-10-CM | POA: Diagnosis not present

## 2020-06-11 DIAGNOSIS — K8020 Calculus of gallbladder without cholecystitis without obstruction: Secondary | ICD-10-CM | POA: Diagnosis not present

## 2020-06-16 ENCOUNTER — Ambulatory Visit (INDEPENDENT_AMBULATORY_CARE_PROVIDER_SITE_OTHER): Payer: Medicare HMO | Admitting: Emergency Medicine

## 2020-06-16 DIAGNOSIS — I428 Other cardiomyopathies: Secondary | ICD-10-CM | POA: Diagnosis not present

## 2020-06-17 LAB — CUP PACEART REMOTE DEVICE CHECK
Battery Remaining Longevity: 69 mo
Battery Voltage: 2.99 V
Brady Statistic AP VP Percent: 0.04 %
Brady Statistic AP VS Percent: 22.39 %
Brady Statistic AS VP Percent: 0.09 %
Brady Statistic AS VS Percent: 77.47 %
Brady Statistic RA Percent Paced: 21.91 %
Brady Statistic RV Percent Paced: 0.13 %
Date Time Interrogation Session: 20210927001604
HighPow Impedance: 58 Ohm
Implantable Lead Implant Date: 20170922
Implantable Lead Implant Date: 20170922
Implantable Lead Location: 753859
Implantable Lead Location: 753860
Implantable Lead Model: 5076
Implantable Pulse Generator Implant Date: 20170922
Lead Channel Impedance Value: 342 Ohm
Lead Channel Impedance Value: 399 Ohm
Lead Channel Impedance Value: 418 Ohm
Lead Channel Pacing Threshold Amplitude: 0.5 V
Lead Channel Pacing Threshold Amplitude: 0.625 V
Lead Channel Pacing Threshold Pulse Width: 0.4 ms
Lead Channel Pacing Threshold Pulse Width: 0.4 ms
Lead Channel Sensing Intrinsic Amplitude: 0.875 mV
Lead Channel Sensing Intrinsic Amplitude: 0.875 mV
Lead Channel Sensing Intrinsic Amplitude: 8.875 mV
Lead Channel Sensing Intrinsic Amplitude: 8.875 mV
Lead Channel Setting Pacing Amplitude: 2 V
Lead Channel Setting Pacing Amplitude: 2.5 V
Lead Channel Setting Pacing Pulse Width: 0.4 ms
Lead Channel Setting Sensing Sensitivity: 0.3 mV

## 2020-06-18 DIAGNOSIS — K8020 Calculus of gallbladder without cholecystitis without obstruction: Secondary | ICD-10-CM | POA: Diagnosis not present

## 2020-06-18 DIAGNOSIS — Z7901 Long term (current) use of anticoagulants: Secondary | ICD-10-CM | POA: Diagnosis not present

## 2020-06-18 DIAGNOSIS — L0291 Cutaneous abscess, unspecified: Secondary | ICD-10-CM | POA: Diagnosis not present

## 2020-06-18 DIAGNOSIS — E118 Type 2 diabetes mellitus with unspecified complications: Secondary | ICD-10-CM | POA: Diagnosis not present

## 2020-06-18 DIAGNOSIS — K219 Gastro-esophageal reflux disease without esophagitis: Secondary | ICD-10-CM | POA: Diagnosis not present

## 2020-06-18 DIAGNOSIS — N529 Male erectile dysfunction, unspecified: Secondary | ICD-10-CM | POA: Diagnosis not present

## 2020-06-18 DIAGNOSIS — J309 Allergic rhinitis, unspecified: Secondary | ICD-10-CM | POA: Diagnosis not present

## 2020-06-18 DIAGNOSIS — N1831 Chronic kidney disease, stage 3a: Secondary | ICD-10-CM | POA: Diagnosis not present

## 2020-06-18 DIAGNOSIS — I498 Other specified cardiac arrhythmias: Secondary | ICD-10-CM | POA: Diagnosis not present

## 2020-06-19 NOTE — Progress Notes (Signed)
Remote ICD transmission.   

## 2020-07-07 DIAGNOSIS — N1831 Chronic kidney disease, stage 3a: Secondary | ICD-10-CM | POA: Diagnosis not present

## 2020-07-07 DIAGNOSIS — K219 Gastro-esophageal reflux disease without esophagitis: Secondary | ICD-10-CM | POA: Diagnosis not present

## 2020-07-07 DIAGNOSIS — K8020 Calculus of gallbladder without cholecystitis without obstruction: Secondary | ICD-10-CM | POA: Diagnosis not present

## 2020-07-07 DIAGNOSIS — E118 Type 2 diabetes mellitus with unspecified complications: Secondary | ICD-10-CM | POA: Diagnosis not present

## 2020-07-07 DIAGNOSIS — I739 Peripheral vascular disease, unspecified: Secondary | ICD-10-CM | POA: Diagnosis not present

## 2020-07-07 DIAGNOSIS — L0291 Cutaneous abscess, unspecified: Secondary | ICD-10-CM | POA: Diagnosis not present

## 2020-07-07 DIAGNOSIS — I498 Other specified cardiac arrhythmias: Secondary | ICD-10-CM | POA: Diagnosis not present

## 2020-07-07 DIAGNOSIS — J309 Allergic rhinitis, unspecified: Secondary | ICD-10-CM | POA: Diagnosis not present

## 2020-07-07 DIAGNOSIS — N529 Male erectile dysfunction, unspecified: Secondary | ICD-10-CM | POA: Diagnosis not present

## 2020-07-22 ENCOUNTER — Other Ambulatory Visit: Payer: Self-pay

## 2020-07-22 ENCOUNTER — Ambulatory Visit (INDEPENDENT_AMBULATORY_CARE_PROVIDER_SITE_OTHER): Payer: Medicare HMO | Admitting: Cardiology

## 2020-07-22 ENCOUNTER — Encounter: Payer: Self-pay | Admitting: Cardiology

## 2020-07-22 VITALS — BP 136/80 | HR 99 | Ht 72.0 in | Wt 180.6 lb

## 2020-07-22 DIAGNOSIS — K219 Gastro-esophageal reflux disease without esophagitis: Secondary | ICD-10-CM | POA: Diagnosis not present

## 2020-07-22 DIAGNOSIS — J309 Allergic rhinitis, unspecified: Secondary | ICD-10-CM | POA: Diagnosis not present

## 2020-07-22 DIAGNOSIS — I428 Other cardiomyopathies: Secondary | ICD-10-CM

## 2020-07-22 DIAGNOSIS — Z23 Encounter for immunization: Secondary | ICD-10-CM | POA: Diagnosis not present

## 2020-07-22 DIAGNOSIS — N1831 Chronic kidney disease, stage 3a: Secondary | ICD-10-CM | POA: Diagnosis not present

## 2020-07-22 DIAGNOSIS — E118 Type 2 diabetes mellitus with unspecified complications: Secondary | ICD-10-CM | POA: Diagnosis not present

## 2020-07-22 DIAGNOSIS — L0291 Cutaneous abscess, unspecified: Secondary | ICD-10-CM | POA: Diagnosis not present

## 2020-07-22 DIAGNOSIS — Z7901 Long term (current) use of anticoagulants: Secondary | ICD-10-CM | POA: Diagnosis not present

## 2020-07-22 DIAGNOSIS — E7800 Pure hypercholesterolemia, unspecified: Secondary | ICD-10-CM | POA: Diagnosis not present

## 2020-07-22 DIAGNOSIS — N529 Male erectile dysfunction, unspecified: Secondary | ICD-10-CM | POA: Diagnosis not present

## 2020-07-22 DIAGNOSIS — K8020 Calculus of gallbladder without cholecystitis without obstruction: Secondary | ICD-10-CM | POA: Diagnosis not present

## 2020-07-22 MED ORDER — ENTRESTO 24-26 MG PO TABS: 1.0000 | ORAL_TABLET | Freq: Two times a day (BID) | ORAL | 3 refills | Status: DC

## 2020-07-22 NOTE — Progress Notes (Signed)
Electrophysiology Office Note   Date:  07/22/2020   ID:  Ryan [name], Ryan [name] 04-30-1946, MRN [id number]  PCP:  Ryan Nakai, MD  Cardiologist:  Ryan [name] Primary Electrophysiologist:  Ryan Haw, MD    No chief complaint on file.    History of Present Illness: Ryan [name] is a 74 y.o. male who presents today for electrophysiology evaluation.     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  He was having a high volume of PVCs.  His burden was up to 20%.  An echo showed an ejection fraction of 30 to 35%.  He was also found to be in an accelerated junctional rhythm and is now status post Medtronic ICD.  Today, denies symptoms of palpitations, chest pain, shortness of breath, orthopnea, PND, lower extremity edema, claudication, dizziness, presyncope, syncope, bleeding, or neurologic sequela. The patient is tolerating medications without difficulties.  Since last being seen he has overall done well.  His main complaint is hip pain and some shortness of breath.  He says that his blood pressures were low and his primary physician is taking him off of his valsartan HCTZ and Toprol-XL.  Since then, his OptiVol was significantly elevated.   Past Medical History:  Diagnosis Date  . Atrial fibrillation ([place])   . 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 Diabetes mellitus without complication ([place])   . Endocarditis   . Hypertension   . Peripheral vascular disease ([place])   . PVC's (premature ventricular contractions)   . SVT (supraventricular tachycardia)  long RP    Past Surgical History:  Procedure Laterality Date  . ABDOMINAL AORTAGRAM N/A 10/03/2012   Procedure: ABDOMINAL Ryan [name];  Surgeon: Ryan Mitchell, MD;  Location: [facility] CATH LAB;  Service: Cardiovascular;  Laterality: N/A;  . CORONARY ANGIOGRAM  09/21/2012   Procedure: CORONARY ANGIOGRAM;   Surgeon: Ryan Grooms, MD;  Location: [facility] CATH LAB;  Service: Cardiovascular;;  . EP IMPLANTABLE DEVICE N/A 06/11/2016   Procedure: ICD Implant;  Surgeon: Ryan Gallentine Meredith Leeds, MD;  Location: [facility] CV LAB;  Service: Cardiovascular;  Laterality: N/A;  . EXTREMITY WIRE/PIN REMOVAL  09/14/2012   Procedure: REMOVAL K-WIRE/PIN EXTREMITY;  Surgeon: Ryan Corning, MD;  Location: [facility];  Service: Orthopedics;  Laterality: Right;  Right Foot  . I & D EXTREMITY  09/14/2012   Procedure: IRRIGATION AND DEBRIDEMENT EXTREMITY;  Surgeon: Ryan Must, MD;  Location: [facility];  Service: Orthopedics;  Laterality: Right;  . INTRAOPERATIVE TRANSESOPHAGEAL ECHOCARDIOGRAM  09/26/2012   Procedure: INTRAOPERATIVE TRANSESOPHAGEAL ECHOCARDIOGRAM;  Surgeon: Ryan Pollack, MD;  Location: [facility] OR;  Service: Open Heart Surgery;  Laterality: N/A;  . MITRAL VALVE REPLACEMENT  09/26/2012   Procedure: MITRAL VALVE (MV) REPLACEMENT;  Surgeon: Ryan Pollack, MD;  Location: [facility] OR;  Service: Open Heart Surgery;  Laterality: N/A;  . RIGHT HEART CATHETERIZATION  09/21/2012   Procedure: RIGHT HEART CATH;  Surgeon: Ryan Grooms, MD;  Location: [facility] CATH LAB;  Service: Cardiovascular;;  . TEE WITHOUT CARDIOVERSION  09/18/2012   Procedure: TRANSESOPHAGEAL ECHOCARDIOGRAM (TEE);  Surgeon: Ryan Furbish, MD;  Location: [facility] ENDOSCOPY;  Service: Cardiovascular;  Laterality: N/A;     Current Outpatient Medications  Medication Sig Dispense Refill  . atorvastatin (LIPITOR) 40 MG tablet Take 1 tablet (40 mg total) by mouth daily at 6 PM. 30 tablet 5  . HYDROcodone-acetaminophen (NORCO/VICODIN) 5-325 MG  per tablet Take 1 tablet by mouth 2 (two) times daily as needed for moderate pain.     Marland Kitchen mexiletine (MEXITIL) 250 MG capsule TAKE 1 CAPSULE BY MOUTH 2 TIMES DAILY. 180 capsule 3  . Multiple Vitamins-Minerals (CENTRUM SILVER PO) Take 1 tablet by mouth 3 (three) times a week.    . pantoprazole (PROTONIX) 40 MG tablet Take 40 mg by mouth daily.      . potassium chloride SA (K-DUR,KLOR-CON) 20 MEQ tablet Take 20 mEq by mouth daily.     . tamsulosin (FLOMAX) 0.4 MG CAPS capsule Take 1 capsule by mouth daily.    Marland Kitchen triamcinolone cream (KENALOG) 0.5 % Apply 1 application topically as needed (SKIN).     . Warfarin Sodium (COUMADIN PO) Take by mouth. Take as directed    . sacubitril-valsartan (ENTRESTO) 24-26 MG Take 1 tablet by mouth 2 (two) times daily. 60 tablet 3   No current facility-administered medications for this visit.    Allergies:   Ryan Pais allergy]   Social History:  The patient  reports that he has never smoked. He has never used smokeless tobacco. He reports that he does not drink alcohol and does not use drugs.   Family History:  The patient's family history includes Hypertension in his father and mother.   ROS:  Please see the history of present illness.   Otherwise, review of systems is positive for none.   All other systems are reviewed and negative.   PHYSICAL EXAM: VS:  BP 136/80   Pulse 99   Ht 6' (1.829 m)   Wt 180 lb 9.6 oz (81.9 kg)   SpO2 98%   BMI 24.49 kg/m  , BMI Body mass index is 24.49 kg/m. GEN: Well nourished, well developed, in no acute distress  HEENT: normal  Neck: no JVD, carotid bruits, or masses Cardiac: RRR; no murmurs, rubs, or gallops,no edema  Respiratory:  clear to auscultation bilaterally, normal work of breathing GI: soft, nontender, nondistended, + BS MS: no deformity or atrophy  Skin: warm and dry, device site well healed Neuro:  Strength and sensation are intact Psych: euthymic mood, full affect  EKG:  EKG is ordered today. Personal review of the ekg ordered shows sinus rhythm, rate 90, PVCs  Personal review of the device interrogation today. Results in [place]: No results found for requested labs within last 8760 hours.    Lipid Panel     Component Value Date/Time   CHOL 121 09/16/2012 0520   TRIG 174 (H) 09/16/2012 0520   HDL 6 (L) 09/16/2012 0520    CHOLHDL 20.2 09/16/2012 0520   VLDL 35 09/16/2012 0520   LDLCALC 80 09/16/2012 0520     Wt Readings from Last 3 Encounters:  07/22/20 180 lb 9.6 oz (81.9 kg)  04/09/20 182 lb 4.8 oz (82.7 kg)  04/02/20 182 lb 9.6 oz (82.8 kg)      Other studies Reviewed: Additional studies/ records that were reviewed today include: 48 hour monitor  Review of the above records today demonstrates:  Minimum: 59 bpm at 9:31 PM Mean: 88 bpm mV Maximum: 134 bpm at 9:44 AM Ventricular ectopy 22.08% of beats Supraventricular events 0.92% of beats Short runs of SVT  TTE 04/26/16 - Left ventricle: The cavity size was normal. Systolic function was   moderately to severely reduced. The estimated ejection fraction   was in the range of 30% to 35%. Diffuse hypokinesis, worse in the   septum. - Aortic  valve: Transvalvular velocity was within the normal range.   There was no stenosis. There was no regurgitation. - Mitral valve: A bioprosthesis was present. Pressure half-time: 63   ms. Mean gradient (D): 12 mm Hg. - Left atrium: The atrium was moderately dilated. - Right ventricle: The cavity size was normal. Wall thickness was   normal. Systolic function was moderately reduced. - Tricuspid valve: There was no regurgitation. - Inferior vena cava: The vessel was normal in size. The   respirophasic diameter changes were in the normal range (>= 50%),   consistent with normal central venous pressure.  Impressions:  - Mean gradient across the mitral valve is significantly increased   compared with 12/03/15. 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 Also, the TVI   ratio is 3.4, consistent with pathologic obstruction. 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   ASSESSMENT AND PLAN:  1.  Junctional rhythm: Status post Medtronic dual-chamber ICD  implanted 06/11/2016.  Device functioning appropriately.  No changes.    2.  Paroxysmal atrial fibrillation: Found on interrogation.  Currently on Coumadin.  CHA2DS2-VASc of 3.    3.  PVCs: Currently on mexiletine (monitoring for high risk medication).  His PVCs are elevated over the last few months.  This correlates with his elevated OptiVol.  We Corrinna Karapetyan work on diuresis to see if we can improve his overall symptomatology.  This may also help his PVC burden.  4.  Status post MVR: Stable on most recent echo.  5.  Chronic systolic heart failure due to nonischemic cardiomyopathy: Status post Medtronic ICD.  Device functioning appropriately.  No changes at this time.  He has been taken off of his heart failure medications due to lower blood pressures.  He is having some shortness of breath, and his Optivol all is elevated.  We Loise Esguerra start him on low-dose Entresto to see if this Stephenson Cichy improve his overall volume status and help with his heart failure.  This may also help reduce his PVC burden.  Current asked .medicines are reviewed at length with the patient today.   The patient does not have concerns regarding his medicines.  The following changes were made today: Start Entresto  Labs/ tests ordered today include:  Orders Placed This Encounter  Procedures  . EKG 12-Lead   Disposition:   FU with Dorathy Stallone 1.5 months  Signed, Tinia Oravec Meredith Leeds, MD  07/22/2020 4:38 PM     [address] [place] [address] [phone] (office) [phone] (fax)

## 2020-07-22 NOTE — Patient Instructions (Addendum)
Medication Instructions:  Your physician has recommended you make the following change in your medication:  1. START Entresto 24/26 mg twice daily  *If you need a refill on your cardiac medications before your next appointment, please call your pharmacy*   Lab Work: None ordered   Testing/Procedures: None ordered   Follow-Up: At [facility], you and your health needs are our priority.  As part of our continuing mission to provide you with exceptional heart care, we have created designated Provider Care Teams.  These Care Teams include your primary Cardiologist (physician) and Advanced Practice Providers (APPs -  Physician Assistants and Nurse Practitioners) who all work together to provide you with the care you need, when you need it.  We recommend signing up for the patient portal called "MyChart".  Sign up information is provided on this After Visit Summary.  MyChart is used to connect with patients for Virtual Visits (Telemedicine).  Patients are able to view lab/test results, encounter notes, upcoming appointments, etc.  Non-urgent messages can be sent to your provider as well.   To learn more about what you can do with MyChart, go to [email or web address].    Remote monitoring is used to monitor your Pacemaker or ICD from home. This monitoring reduces the number of office visits required to check your device to one time per year. It allows [place] to keep an eye on the functioning of your device to ensure it is working properly. You are scheduled for a device check from home on 09/15/20. You may send your transmission at any time that day. If you have a wireless device, the transmission will be sent automatically. After your physician reviews your transmission, you will receive a postcard with your next transmission date.  Your next appointment:   6 week(s) in   The format for your next appointment:   In Person  Provider:   Allegra Lai, MD   Thank you for choosing [facility]!!   Trinidad Curet, RN [phone]    Other Instructions

## 2020-08-05 DIAGNOSIS — I739 Peripheral vascular disease, unspecified: Secondary | ICD-10-CM | POA: Diagnosis not present

## 2020-08-05 DIAGNOSIS — N1831 Chronic kidney disease, stage 3a: Secondary | ICD-10-CM | POA: Diagnosis not present

## 2020-08-05 DIAGNOSIS — K8020 Calculus of gallbladder without cholecystitis without obstruction: Secondary | ICD-10-CM | POA: Diagnosis not present

## 2020-08-05 DIAGNOSIS — K219 Gastro-esophageal reflux disease without esophagitis: Secondary | ICD-10-CM | POA: Diagnosis not present

## 2020-08-05 DIAGNOSIS — J309 Allergic rhinitis, unspecified: Secondary | ICD-10-CM | POA: Diagnosis not present

## 2020-08-05 DIAGNOSIS — E118 Type 2 diabetes mellitus with unspecified complications: Secondary | ICD-10-CM | POA: Diagnosis not present

## 2020-08-05 DIAGNOSIS — M25552 Pain in left hip: Secondary | ICD-10-CM | POA: Diagnosis not present

## 2020-08-05 DIAGNOSIS — N529 Male erectile dysfunction, unspecified: Secondary | ICD-10-CM | POA: Diagnosis not present

## 2020-08-05 DIAGNOSIS — I498 Other specified cardiac arrhythmias: Secondary | ICD-10-CM | POA: Diagnosis not present

## 2020-08-06 ENCOUNTER — Other Ambulatory Visit: Payer: Self-pay | Admitting: Interventional Cardiology

## 2020-08-19 DIAGNOSIS — M25552 Pain in left hip: Secondary | ICD-10-CM | POA: Diagnosis not present

## 2020-08-19 DIAGNOSIS — K219 Gastro-esophageal reflux disease without esophagitis: Secondary | ICD-10-CM | POA: Diagnosis not present

## 2020-08-19 DIAGNOSIS — K8020 Calculus of gallbladder without cholecystitis without obstruction: Secondary | ICD-10-CM | POA: Diagnosis not present

## 2020-08-19 DIAGNOSIS — E118 Type 2 diabetes mellitus with unspecified complications: Secondary | ICD-10-CM | POA: Diagnosis not present

## 2020-08-19 DIAGNOSIS — J309 Allergic rhinitis, unspecified: Secondary | ICD-10-CM | POA: Diagnosis not present

## 2020-08-19 DIAGNOSIS — I498 Other specified cardiac arrhythmias: Secondary | ICD-10-CM | POA: Diagnosis not present

## 2020-08-19 DIAGNOSIS — N529 Male erectile dysfunction, unspecified: Secondary | ICD-10-CM | POA: Diagnosis not present

## 2020-08-19 DIAGNOSIS — Z7901 Long term (current) use of anticoagulants: Secondary | ICD-10-CM | POA: Diagnosis not present

## 2020-08-19 DIAGNOSIS — N1831 Chronic kidney disease, stage 3a: Secondary | ICD-10-CM | POA: Diagnosis not present

## 2020-08-25 ENCOUNTER — Encounter: Payer: Self-pay | Admitting: Cardiology

## 2020-08-25 ENCOUNTER — Ambulatory Visit (INDEPENDENT_AMBULATORY_CARE_PROVIDER_SITE_OTHER): Payer: Medicare HMO | Admitting: Cardiology

## 2020-08-25 ENCOUNTER — Observation Stay: Admission: AD | Admit: 2020-08-25 | Payer: Medicare HMO | Source: Ambulatory Visit | Admitting: Cardiology

## 2020-08-25 ENCOUNTER — Telehealth: Payer: Self-pay | Admitting: Cardiology

## 2020-08-25 ENCOUNTER — Encounter (HOSPITAL_COMMUNITY): Payer: Self-pay | Admitting: Emergency Medicine

## 2020-08-25 ENCOUNTER — Emergency Department (HOSPITAL_COMMUNITY): Payer: Medicare HMO

## 2020-08-25 ENCOUNTER — Other Ambulatory Visit: Payer: Self-pay

## 2020-08-25 ENCOUNTER — Inpatient Hospital Stay (HOSPITAL_COMMUNITY)
Admission: EM | Admit: 2020-08-25 | Discharge: 2020-09-06 | DRG: 273 | Disposition: A | Discharge status: Home Health | Payer: Medicare HMO | Attending: Internal Medicine | Admitting: Internal Medicine

## 2020-08-25 VITALS — BP 100/68 | HR 136 | Ht 72.0 in | Wt 191.0 lb

## 2020-08-25 DIAGNOSIS — N179 Acute kidney failure, unspecified: Secondary | ICD-10-CM | POA: Diagnosis not present

## 2020-08-25 DIAGNOSIS — Y92239 Unspecified place in hospital as the place of occurrence of the external cause: Secondary | ICD-10-CM | POA: Diagnosis not present

## 2020-08-25 DIAGNOSIS — I440 Atrioventricular block, first degree: Secondary | ICD-10-CM | POA: Diagnosis present

## 2020-08-25 DIAGNOSIS — I5043 Acute on chronic combined systolic (congestive) and diastolic (congestive) heart failure: Secondary | ICD-10-CM | POA: Diagnosis not present

## 2020-08-25 DIAGNOSIS — I952 Hypotension due to drugs: Secondary | ICD-10-CM | POA: Diagnosis not present

## 2020-08-25 DIAGNOSIS — T502X5A Adverse effect of carbonic-anhydrase inhibitors, benzothiadiazides and other diuretics, initial encounter: Secondary | ICD-10-CM | POA: Diagnosis not present

## 2020-08-25 DIAGNOSIS — Z8673 Personal history of transient ischemic attack (TIA), and cerebral infarction without residual deficits: Secondary | ICD-10-CM

## 2020-08-25 DIAGNOSIS — Z953 Presence of xenogenic heart valve: Secondary | ICD-10-CM | POA: Diagnosis not present

## 2020-08-25 DIAGNOSIS — Z95810 Presence of automatic (implantable) cardiac defibrillator: Secondary | ICD-10-CM | POA: Diagnosis present

## 2020-08-25 DIAGNOSIS — E785 Hyperlipidemia, unspecified: Secondary | ICD-10-CM | POA: Diagnosis present

## 2020-08-25 DIAGNOSIS — I130 Hypertensive heart and chronic kidney disease with heart failure and stage 1 through stage 4 chronic kidney disease, or unspecified chronic kidney disease: Secondary | ICD-10-CM | POA: Diagnosis not present

## 2020-08-25 DIAGNOSIS — I110 Hypertensive heart disease with heart failure: Secondary | ICD-10-CM | POA: Diagnosis not present

## 2020-08-25 DIAGNOSIS — I509 Heart failure, unspecified: Secondary | ICD-10-CM

## 2020-08-25 DIAGNOSIS — I471 Supraventricular tachycardia: Secondary | ICD-10-CM | POA: Diagnosis present

## 2020-08-25 DIAGNOSIS — I5022 Chronic systolic (congestive) heart failure: Secondary | ICD-10-CM | POA: Diagnosis not present

## 2020-08-25 DIAGNOSIS — Z91013 Allergy to seafood: Secondary | ICD-10-CM | POA: Diagnosis not present

## 2020-08-25 DIAGNOSIS — I4510 Unspecified right bundle-branch block: Secondary | ICD-10-CM | POA: Diagnosis present

## 2020-08-25 DIAGNOSIS — L03113 Cellulitis of right upper limb: Secondary | ICD-10-CM | POA: Diagnosis not present

## 2020-08-25 DIAGNOSIS — I5023 Acute on chronic systolic (congestive) heart failure: Secondary | ICD-10-CM | POA: Diagnosis not present

## 2020-08-25 DIAGNOSIS — I5021 Acute systolic (congestive) heart failure: Secondary | ICD-10-CM | POA: Diagnosis not present

## 2020-08-25 DIAGNOSIS — Z20822 Contact with and (suspected) exposure to covid-19: Secondary | ICD-10-CM | POA: Diagnosis not present

## 2020-08-25 DIAGNOSIS — Z79899 Other long term (current) drug therapy: Secondary | ICD-10-CM

## 2020-08-25 DIAGNOSIS — J9811 Atelectasis: Secondary | ICD-10-CM | POA: Diagnosis not present

## 2020-08-25 DIAGNOSIS — K219 Gastro-esophageal reflux disease without esophagitis: Secondary | ICD-10-CM | POA: Diagnosis present

## 2020-08-25 DIAGNOSIS — I071 Rheumatic tricuspid insufficiency: Secondary | ICD-10-CM | POA: Diagnosis present

## 2020-08-25 DIAGNOSIS — R110 Nausea: Secondary | ICD-10-CM | POA: Diagnosis not present

## 2020-08-25 DIAGNOSIS — Z95828 Presence of other vascular implants and grafts: Secondary | ICD-10-CM

## 2020-08-25 DIAGNOSIS — Z8249 Family history of ischemic heart disease and other diseases of the circulatory system: Secondary | ICD-10-CM

## 2020-08-25 DIAGNOSIS — E1122 Type 2 diabetes mellitus with diabetic chronic kidney disease: Secondary | ICD-10-CM | POA: Diagnosis present

## 2020-08-25 DIAGNOSIS — I517 Cardiomegaly: Secondary | ICD-10-CM | POA: Diagnosis not present

## 2020-08-25 DIAGNOSIS — R079 Chest pain, unspecified: Secondary | ICD-10-CM

## 2020-08-25 DIAGNOSIS — I5082 Biventricular heart failure: Secondary | ICD-10-CM | POA: Diagnosis present

## 2020-08-25 DIAGNOSIS — I420 Dilated cardiomyopathy: Secondary | ICD-10-CM | POA: Diagnosis present

## 2020-08-25 DIAGNOSIS — E876 Hypokalemia: Secondary | ICD-10-CM | POA: Diagnosis not present

## 2020-08-25 DIAGNOSIS — I4819 Other persistent atrial fibrillation: Secondary | ICD-10-CM | POA: Diagnosis not present

## 2020-08-25 DIAGNOSIS — Z7901 Long term (current) use of anticoagulants: Secondary | ICD-10-CM | POA: Diagnosis not present

## 2020-08-25 DIAGNOSIS — I4719 Other supraventricular tachycardia: Secondary | ICD-10-CM

## 2020-08-25 DIAGNOSIS — I4892 Unspecified atrial flutter: Secondary | ICD-10-CM | POA: Diagnosis present

## 2020-08-25 DIAGNOSIS — Z8679 Personal history of other diseases of the circulatory system: Secondary | ICD-10-CM | POA: Diagnosis not present

## 2020-08-25 DIAGNOSIS — N1832 Chronic kidney disease, stage 3b: Secondary | ICD-10-CM | POA: Diagnosis present

## 2020-08-25 DIAGNOSIS — Z9889 Other specified postprocedural states: Secondary | ICD-10-CM | POA: Diagnosis not present

## 2020-08-25 DIAGNOSIS — I498 Other specified cardiac arrhythmias: Secondary | ICD-10-CM

## 2020-08-25 DIAGNOSIS — I272 Pulmonary hypertension, unspecified: Secondary | ICD-10-CM | POA: Diagnosis present

## 2020-08-25 DIAGNOSIS — I493 Ventricular premature depolarization: Secondary | ICD-10-CM | POA: Diagnosis present

## 2020-08-25 DIAGNOSIS — N183 Chronic kidney disease, stage 3 unspecified: Secondary | ICD-10-CM | POA: Diagnosis not present

## 2020-08-25 DIAGNOSIS — R609 Edema, unspecified: Secondary | ICD-10-CM

## 2020-08-25 DIAGNOSIS — E1151 Type 2 diabetes mellitus with diabetic peripheral angiopathy without gangrene: Secondary | ICD-10-CM | POA: Diagnosis present

## 2020-08-25 DIAGNOSIS — I5041 Acute combined systolic (congestive) and diastolic (congestive) heart failure: Secondary | ICD-10-CM | POA: Diagnosis not present

## 2020-08-25 HISTORY — DX: Presence of automatic (implantable) cardiac defibrillator: Z95.810

## 2020-08-25 HISTORY — DX: Heart failure, unspecified: I50.9

## 2020-08-25 LAB — BASIC METABOLIC PANEL
Anion gap: 9 (ref 5–15)
BUN/Creatinine Ratio: 17 (ref 10–24)
BUN: 30 mg/dL — ABNORMAL HIGH (ref 8–27)
BUN: 30 mg/dL — ABNORMAL HIGH (ref 8–23)
CO2: 18 mmol/L — ABNORMAL LOW (ref 20–29)
CO2: 18 mmol/L — ABNORMAL LOW (ref 22–32)
Calcium: 8.8 mg/dL (ref 8.6–10.2)
Calcium: 9.2 mg/dL (ref 8.9–10.3)
Chloride: 113 mmol/L — ABNORMAL HIGH (ref 96–106)
Chloride: 116 mmol/L — ABNORMAL HIGH (ref 98–111)
Creatinine, Ser: 1.76 mg/dL — ABNORMAL HIGH (ref 0.76–1.27)
Creatinine, Ser: 1.78 mg/dL — ABNORMAL HIGH (ref 0.61–1.24)
GFR calc Af Amer: 42 mL/min/{1.73_m2} — ABNORMAL LOW (ref >59)
GFR calc non Af Amer: 37 mL/min/{1.73_m2} — ABNORMAL LOW (ref >59)
GFR, Estimated: 39 mL/min — ABNORMAL LOW (ref >60)
Glucose, Bld: 110 mg/dL — ABNORMAL HIGH (ref 70–99)
Glucose: 132 mg/dL — ABNORMAL HIGH (ref 65–99)
Potassium: 4.8 mmol/L (ref 3.5–5.2)
Potassium: 4.5 mmol/L (ref 3.5–5.1)
Sodium: 144 mmol/L (ref 134–144)
Sodium: 143 mmol/L (ref 135–145)

## 2020-08-25 LAB — CUP PACEART INCLINIC DEVICE CHECK
Battery Remaining Longevity: 63 mo
Battery Voltage: 2.97 V
Brady Statistic AP VP Percent: 0.02 %
Brady Statistic AP VS Percent: 1.1 %
Brady Statistic AS VP Percent: 0.05 %
Brady Statistic AS VS Percent: 98.82 %
Brady Statistic RA Percent Paced: 1.09 %
Brady Statistic RV Percent Paced: 0.07 %
Date Time Interrogation Session: 20211206103300
HighPow Impedance: 40 Ohm
Implantable Lead Implant Date: 20170922
Implantable Lead Implant Date: 20170922
Implantable Lead Location: 753859
Implantable Lead Location: 753860
Implantable Lead Model: 5076
Implantable Pulse Generator Implant Date: 20170922
Lead Channel Impedance Value: 285 Ohm
Lead Channel Impedance Value: 342 Ohm
Lead Channel Impedance Value: 361 Ohm
Lead Channel Pacing Threshold Amplitude: 0.625 V
Lead Channel Pacing Threshold Amplitude: 0.75 V
Lead Channel Pacing Threshold Pulse Width: 0.4 ms
Lead Channel Pacing Threshold Pulse Width: 0.4 ms
Lead Channel Sensing Intrinsic Amplitude: 1 mV
Lead Channel Sensing Intrinsic Amplitude: 6.375 mV
Lead Channel Sensing Intrinsic Amplitude: 8.125 mV
Lead Channel Setting Pacing Amplitude: 2 V
Lead Channel Setting Pacing Amplitude: 2.5 V
Lead Channel Setting Pacing Pulse Width: 0.4 ms
Lead Channel Setting Sensing Sensitivity: 0.3 mV

## 2020-08-25 LAB — TROPONIN I (HIGH SENSITIVITY)
Troponin I (High Sensitivity): 25 ng/L — ABNORMAL HIGH (ref <18)
Troponin I (High Sensitivity): 30 ng/L — ABNORMAL HIGH (ref <18)

## 2020-08-25 LAB — CBC
HCT: 38 % — ABNORMAL LOW (ref 39.0–52.0)
Hematocrit: 37.9 % (ref 37.5–51.0)
Hemoglobin: 11.9 g/dL — ABNORMAL LOW (ref 13.0–17.7)
Hemoglobin: 11.5 g/dL — ABNORMAL LOW (ref 13.0–17.0)
MCH: 23.7 pg — ABNORMAL LOW (ref 26.6–33.0)
MCH: 22.8 pg — ABNORMAL LOW (ref 26.0–34.0)
MCHC: 31.4 g/dL — ABNORMAL LOW (ref 31.5–35.7)
MCHC: 30.3 g/dL (ref 30.0–36.0)
MCV: 76 fL — ABNORMAL LOW (ref 79–97)
MCV: 75.4 fL — ABNORMAL LOW (ref 80.0–100.0)
Platelets: 245 10*3/uL (ref 150–450)
Platelets: 328 10*3/uL (ref 150–400)
RBC: 5.02 x10E6/uL (ref 4.14–5.80)
RBC: 5.04 MIL/uL (ref 4.22–5.81)
RDW: 20 % — ABNORMAL HIGH (ref 11.6–15.4)
RDW: 19.7 % — ABNORMAL HIGH (ref 11.5–15.5)
WBC: 5.1 10*3/uL (ref 3.4–10.8)
WBC: 5.2 10*3/uL (ref 4.0–10.5)
nRBC: 0 % (ref 0.0–0.2)

## 2020-08-25 LAB — PRO B NATRIURETIC PEPTIDE: NT-Pro BNP: 21395 pg/mL — ABNORMAL HIGH (ref 0–486)

## 2020-08-25 LAB — MAGNESIUM: Magnesium: 2.4 mg/dL — ABNORMAL HIGH (ref 1.6–2.3)

## 2020-08-25 NOTE — Telephone Encounter (Signed)
Spoke to dtr who reports pt worsened, especially his SOB so they took him to ED. Bed placement made aware to cancel direct admit as pt is already there now. Thanked dtr for letting me know

## 2020-08-25 NOTE — Telephone Encounter (Signed)
Returned call. Informed that we cannot replace order for direct admission being that pt is currently in the emergency room. She appreciates my return call

## 2020-08-25 NOTE — Progress Notes (Signed)
Electrophysiology Office Note   Date:  08/25/2020   ID:  Yaqub, Arney [birth year], MRN [id number]  PCP:  [name], MD  Cardiologist:  Pernell Dupre Primary Electrophysiologist:  Constance Haw, MD    No chief complaint on file.    History of Present Illness: Ryan [name] is a 74 y.o. male who presents today for electrophysiology evaluation.      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  He was having a PVC burden of 20% with an echo showing an ejection fraction of 30 to 35%.  He was found to be in an accelerated junctional rhythm and is now status post Medtronic ICD.  Today, denies symptoms of palpitations, chest pain, lower claudication, dizziness, presyncope, syncope, bleeding, or neurologic sequela. The patient is tolerating medications without difficulties.  He presents to clinic today feeling quite short of breath.  He is short of breath with walking just about any distance.  He also has symptoms of PND and orthopnea.  He does state that at times he has been sleeping in a chair.  He has significant lower extremity edema that has worsened over the past few weeks.  He does not have much chest pain, but does have abdominal bloating and abdominal pain.  His appetite has worsened over the past few weeks.   Past Medical History:  Diagnosis Date  . Atrial fibrillation ([place])   . 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 Diabetes mellitus without complication ([place])   . Endocarditis   . Hypertension   . Peripheral vascular disease ([place])   . PVC's (premature ventricular contractions)   . SVT (supraventricular tachycardia)  long RP    Past Surgical History:  Procedure Laterality Date  . ABDOMINAL AORTAGRAM N/A 10/03/2012   Procedure: ABDOMINAL Maxcine Ham;  Surgeon: [name], MD;  Location: [facility] CATH LAB;  Service: Cardiovascular;  Laterality: N/A;  .  CORONARY ANGIOGRAM  09/21/2012   Procedure: CORONARY ANGIOGRAM;  Surgeon: [name], MD;  Location: [facility] CATH LAB;  Service: Cardiovascular;;  . EP IMPLANTABLE DEVICE N/A 06/11/2016   Procedure: ICD Implant;  Surgeon: Kemaya Dorner Meredith Leeds, MD;  Location: [facility] CV LAB;  Service: Cardiovascular;  Laterality: N/A;  . EXTREMITY WIRE/PIN REMOVAL  09/14/2012   Procedure: REMOVAL K-WIRE/PIN EXTREMITY;  Surgeon: [name], MD;  Location: [facility];  Service: Orthopedics;  Laterality: Right;  Right Foot  . I & D EXTREMITY  09/14/2012   Procedure: IRRIGATION AND DEBRIDEMENT EXTREMITY;  Surgeon: Tennis Must, MD;  Location: [facility];  Service: Orthopedics;  Laterality: Right;  . INTRAOPERATIVE TRANSESOPHAGEAL ECHOCARDIOGRAM  09/26/2012   Procedure: INTRAOPERATIVE TRANSESOPHAGEAL ECHOCARDIOGRAM;  Surgeon: [name], MD;  Location: [facility] OR;  Service: Open Heart Surgery;  Laterality: N/A;  . MITRAL VALVE REPLACEMENT  09/26/2012   Procedure: MITRAL VALVE (MV) REPLACEMENT;  Surgeon: [name], MD;  Location: [facility] OR;  Service: Open Heart Surgery;  Laterality: N/A;  . RIGHT HEART CATHETERIZATION  09/21/2012   Procedure: RIGHT HEART CATH;  Surgeon: [name], MD;  Location: [facility] CATH LAB;  Service: Cardiovascular;;  . TEE WITHOUT CARDIOVERSION  09/18/2012   Procedure: TRANSESOPHAGEAL ECHOCARDIOGRAM (TEE);  Surgeon: [name], MD;  Location: [facility] ENDOSCOPY;  Service: Cardiovascular;  Laterality: N/A;     Current Outpatient Medications  Medication Sig Dispense Refill  . atorvastatin (LIPITOR) 40 MG tablet Take 1 tablet (40  mg total) by mouth daily at 6 PM. 30 tablet 5  . HYDROcodone-acetaminophen (NORCO/VICODIN) 5-325 MG per tablet Take 1 tablet by mouth 2 (two) times daily as needed for moderate pain.     Marland Kitchen mexiletine (MEXITIL) 250 MG capsule TAKE 1 CAPSULE BY MOUTH 2 TIMES DAILY. 180 capsule 3  . Multiple Vitamins-Minerals (CENTRUM SILVER PO) Take 1 tablet by mouth 3 (three) times a week.    .  pantoprazole (PROTONIX) 40 MG tablet Take 40 mg by mouth daily.     . potassium chloride SA (K-DUR,KLOR-CON) 20 MEQ tablet Take 20 mEq by mouth daily.     . sacubitril-valsartan (ENTRESTO) 24-26 MG Take 1 tablet by mouth 2 (two) times daily. 60 tablet 3  . tamsulosin (FLOMAX) 0.4 MG CAPS capsule Take 1 capsule by mouth daily.    Marland Kitchen triamcinolone cream (KENALOG) 0.5 % Apply 1 application topically as needed (SKIN).     . Warfarin Sodium (COUMADIN PO) Take by mouth. Take as directed     No current facility-administered medications for this visit.    Allergies:   [name] allergy]   Social History:  The patient  reports that he has never smoked. He has never used smokeless tobacco. He reports that he does not drink alcohol and does not use drugs.   Family History:  The patient's family history includes Hypertension in his father and mother.   ROS:  Please see the history of present illness.   Otherwise, review of systems is positive for none.   All other systems are reviewed and negative.   PHYSICAL EXAM: VS:  BP 100/68   Pulse (!) 136   Ht 6' (1.829 m)   Wt 191 lb (86.6 kg)   SpO2 98%   BMI 25.90 kg/m  , BMI Body mass index is 25.9 kg/m. GEN: Well nourished, well developed, in no acute distress  HEENT: normal  Neck: JVD to the angle of the jaw sitting up, carotid bruits, or masses Cardiac: Tachycardic, regular; no murmurs, rubs, or gallops, 2+ edema Respiratory:  clear to auscultation bilaterally, normal work of breathing GI: soft, nontender, nondistended, + BS MS: no deformity or atrophy  Skin: warm and dry, device site well healed Neuro:  Strength and sensation are intact Psych: euthymic mood, full affect  EKG:  EKG is ordered today. Personal review of the ekg ordered shows junctional tachycardia versus SVT  Personal review of the device interrogation today. Results in [place]: No results found for requested labs within last 8760 hours.    Lipid Panel      Component Value Date/Time   CHOL 121 09/16/2012 0520   TRIG 174 (H) 09/16/2012 0520   HDL 6 (L) 09/16/2012 0520   CHOLHDL 20.2 09/16/2012 0520   VLDL 35 09/16/2012 0520   LDLCALC 80 09/16/2012 0520     Wt Readings from Last 3 Encounters:  08/25/20 191 lb (86.6 kg)  07/22/20 180 lb 9.6 oz (81.9 kg)  04/09/20 182 lb 4.8 oz (82.7 kg)      Other studies Reviewed: Additional studies/ records that were reviewed today include: 48 hour monitor  Review of the above records today demonstrates:  Minimum: 59 bpm at 9:31 PM Mean: 88 bpm mV Maximum: 134 bpm at 9:44 AM Ventricular ectopy 22.08% of beats Supraventricular events 0.92% of beats Short runs of SVT  TTE 04/26/16 - Left ventricle: The cavity size was normal. Systolic function was   moderately to severely reduced. The estimated ejection  fraction   was in the range of 30% to 35%. Diffuse hypokinesis, worse in the   septum. - Aortic valve: Transvalvular velocity was within the normal range.   There was no stenosis. There was no regurgitation. - Mitral valve: A bioprosthesis was present. Pressure half-time: 63   ms. Mean gradient (D): 12 mm Hg. - Left atrium: The atrium was moderately dilated. - Right ventricle: The cavity size was normal. Wall thickness was   normal. Systolic function was moderately reduced. - Tricuspid valve: There was no regurgitation. - Inferior vena cava: The vessel was normal in size. The   respirophasic diameter changes were in the normal range (>= 50%),   consistent with normal central venous pressure.  Impressions:  - Mean gradient across the mitral valve is significantly increased   compared with 12/03/15. 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 Also, the TVI   ratio is 3.4, consistent with pathologic obstruction. 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   ASSESSMENT AND PLAN:  1.  Junctional rhythm: Status post Medtronic ICD implanted 06/11/2016.  Device functioning appropriately.  No changes.    2.  Paroxysmal atrial fibrillation: Currently on Coumadin.  CHA2DS2-VASc of 3.  Found on device interrogation.  3.  PVCs: Currently on mexiletine, monitoring for high risk medication.  Unable to tell his overall burden at this time due to tachycardia.  4.  Mitral valve repair: Stable on most recent echo.  5.  Chronic systolic heart failure due to nonischemic cardiomyopathy: Status post Medtronic ICD.  Device functioning appropriately.  He is currently on Entresto.  He is significantly volume overloaded with JVD, PND, orthopnea, and dyspnea on exertion.  Due to his constellation of symptoms, abdominal bloating and decreased appetite, I do feel that this would best be managed in the hospital.  We Jaiyla Granados plan for a direct admission.  We Azaliah Carrero get a basic metabolic, CBC, BNP, and magnesium today and prep for his admission.  He Arwilda Georgia likely need IV diuresis.  He Yomayra Tate likely need to hold his Entresto and start rate controlling medications.  He Lopaka Karge also likely need an updated transthoracic echo.  Current asked .medicines are reviewed at length with the patient today.   The patient does not have concerns regarding his medicines.  The following changes were made today: none  Labs/ tests ordered today include:  Orders Placed This Encounter  Procedures  . Basic metabolic panel  . CBC  . Magnesium  . Pro b natriuretic peptide (BNP)  . EKG 12-Lead   Disposition:   FU with Tyse Auriemma 3 months  Signed, Maymuna Detzel Meredith Leeds, MD  08/25/2020 11:30 AM     [facility] [address] [place] [address] [phone] (office) [phone] (fax)

## 2020-08-25 NOTE — H&P (Signed)
Expand All Collapse All      Electrophysiology Office Note   Date:  08/25/2020   ID:  Ryan [name], DOB [birth year], MRN [id number]  PCP:  Ryan Nakai, MD            Cardiologist:  Ryan [name] Primary Electrophysiologist:  Ryan Haddaway Meredith Leeds, MD       No chief complaint on file.    History of Present Illness: Ryan [name] is a 74 y.o. male who presents today for electrophysiology evaluation.      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  He was having a PVC burden of 20% with an echo showing an ejection fraction of 30 to 35%.  He was found to be in an accelerated junctional rhythm and is now status post Medtronic ICD.  Today, denies symptoms of palpitations, chest pain, lower claudication, dizziness, presyncope, syncope, bleeding, or neurologic sequela. The patient is tolerating medications without difficulties.  He presents to clinic today feeling quite short of breath.  He is short of breath with walking just about any distance.  He also has symptoms of PND and orthopnea.  He does state that at times he has been sleeping in a chair.  He has significant lower extremity edema that has worsened over the past few weeks.  He does not have much chest pain, but does have abdominal bloating and abdominal pain.  His appetite has worsened over the past few weeks.       Past Medical History:  Diagnosis Date  . Atrial fibrillation ([place])   . 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 Diabetes mellitus without complication ([place])   . Endocarditis   . Hypertension   . Peripheral vascular disease ([place])   . PVC's (premature ventricular contractions)   . SVT (supraventricular tachycardia)  long RP    Past Surgical History:  Procedure Laterality Date  . ABDOMINAL AORTAGRAM N/A 10/03/2012   Procedure: ABDOMINAL Maxcine Ham;  Surgeon: [name], MD;  Location:  [facility] CATH LAB;  Service: Cardiovascular;  Laterality: N/A;  . CORONARY ANGIOGRAM  09/21/2012   Procedure: CORONARY ANGIOGRAM;  Surgeon: [name], MD;  Location: [facility] CATH LAB;  Service: Cardiovascular;;  . EP IMPLANTABLE DEVICE N/A 06/11/2016   Procedure: ICD Implant;  Surgeon: Katelyn Broadnax Meredith Leeds, MD;  Location: [place] CV LAB;  Service: Cardiovascular;  Laterality: N/A;  . EXTREMITY WIRE/PIN REMOVAL  09/14/2012   Procedure: REMOVAL K-WIRE/PIN EXTREMITY;  Surgeon: [name], MD;  Location: [place];  Service: Orthopedics;  Laterality: Right;  Right Foot  . I & D EXTREMITY  09/14/2012   Procedure: IRRIGATION AND DEBRIDEMENT EXTREMITY;  Surgeon: Tennis Must, MD;  Location: [place];  Service: Orthopedics;  Laterality: Right;  . INTRAOPERATIVE TRANSESOPHAGEAL ECHOCARDIOGRAM  09/26/2012   Procedure: INTRAOPERATIVE TRANSESOPHAGEAL ECHOCARDIOGRAM;  Surgeon: [name], MD;  Location: [facility] OR;  Service: Open Heart Surgery;  Laterality: N/A;  . MITRAL VALVE REPLACEMENT  09/26/2012   Procedure: MITRAL VALVE (MV) REPLACEMENT;  Surgeon: [name], MD;  Location: [place] OR;  Service: Open Heart Surgery;  Laterality: N/A;  . RIGHT HEART CATHETERIZATION  09/21/2012   Procedure: RIGHT HEART CATH;  Surgeon: [name], MD;  Location[facility] CATH LAB;  Service: Cardiovascular;;  . TEE WITHOUT CARDIOVERSION  09/18/2012   Procedure: TRANSESOPHAGEAL ECHOCARDIOGRAM (TEE);  Surgeon: [name], MD;  Location: [place];  Service: Cardiovascular;  Laterality: N/A;           Current Outpatient Medications  Medication Sig Dispense Refill  . atorvastatin (LIPITOR) 40 MG tablet Take 1 tablet (40 mg total) by mouth daily at 6 PM. 30 tablet 5  . HYDROcodone-acetaminophen (NORCO/VICODIN) 5-325 MG per tablet Take 1 tablet by mouth 2 (two) times daily as needed for moderate pain.     Marland Kitchen mexiletine (MEXITIL) 250 MG capsule TAKE 1 CAPSULE BY MOUTH 2 TIMES DAILY. 180 capsule 3  . Multiple  Vitamins-Minerals (CENTRUM SILVER PO) Take 1 tablet by mouth 3 (three) times a week.    . pantoprazole (PROTONIX) 40 MG tablet Take 40 mg by mouth daily.     . potassium chloride SA (K-DUR,KLOR-CON) 20 MEQ tablet Take 20 mEq by mouth daily.     . sacubitril-valsartan (ENTRESTO) 24-26 MG Take 1 tablet by mouth 2 (two) times daily. 60 tablet 3  . tamsulosin (FLOMAX) 0.4 MG CAPS capsule Take 1 capsule by mouth daily.    Marland Kitchen triamcinolone cream (KENALOG) 0.5 % Apply 1 application topically as needed (SKIN).     . Warfarin Sodium (COUMADIN PO) Take by mouth. Take as directed     No current facility-administered medications for this visit.    Allergies:   [name] allergy]   Social History:  The patient  reports that he has never smoked. He has never used smokeless tobacco. He reports that he does not drink alcohol and does not use drugs.   Family History:  The patient's family history includes Hypertension in his father and mother.   ROS:  Please see the history of present illness.   Otherwise, review of systems is positive for none.   All other systems are reviewed and negative.   PHYSICAL EXAM: VS:  BP 100/68   Pulse (!) 136   Ht 6' (1.829 m)   Wt 191 lb (86.6 kg)   SpO2 98%   BMI 25.90 kg/m  , BMI Body mass index is 25.9 kg/m. GEN: Well nourished, well developed, in no acute distress  HEENT: normal  Neck: JVD to the angle of the jaw sitting up, carotid bruits, or masses Cardiac: Tachycardic, regular; no murmurs, rubs, or gallops, 2+ edema Respiratory:  clear to auscultation bilaterally, normal work of breathing GI: soft, nontender, nondistended, + BS MS: no deformity or atrophy  Skin: warm and dry, device site well healed Neuro:  Strength and sensation are intact Psych: euthymic mood, full affect  EKG:  EKG is ordered today. Personal review of the ekg ordered shows junctional tachycardia versus SVT  Personal review of the device interrogation today.  Results in [place]: No results found for requested labs within last 8760 hours.    Lipid Panel  Labs (Brief)          Component Value Date/Time   CHOL 121 09/16/2012 0520   TRIG 174 (H) 09/16/2012 0520   HDL 6 (L) 09/16/2012 0520   CHOLHDL 20.2 09/16/2012 0520   VLDL 35 09/16/2012 0520   LDLCALC 80 09/16/2012 0520          Wt Readings from Last 3 Encounters:  08/25/20 191 lb (86.6 kg)  07/22/20 180 lb 9.6 oz (81.9 kg)  04/09/20 182 lb 4.8 oz (82.7 kg)      Other studies Reviewed: Additional studies/ records that were reviewed today include: 48 hour monitor  Review of the above records today demonstrates:  Minimum: 59 bpm at 9:31 PM Mean: 88 bpm  mV Maximum: 134 bpm at 9:44 AM Ventricular ectopy 22.08% of beats Supraventricular events 0.92% of beats Short runs of SVT  TTE 04/26/16 - Left ventricle: The cavity size was normal. Systolic function was moderately to severely reduced. The estimated ejection fraction was in the range of 30% to 35%. Diffuse hypokinesis, worse in the septum. - Aortic valve: Transvalvular velocity was within the normal range. There was no stenosis. There was no regurgitation. - Mitral valve: A bioprosthesis was present. Pressure half-time: 63 ms. Mean gradient (D): 12 mm Hg. - Left atrium: The atrium was moderately dilated. - Right ventricle: The cavity size was normal. Wall thickness was normal. Systolic function was moderately reduced. - Tricuspid valve: There was no regurgitation. - Inferior vena cava: The vessel was normal in size. The respirophasic diameter changes were in the normal range (>= 50%), consistent with normal central venous pressure.  Impressions:  - Mean gradient across the mitral valve is significantly increased compared with 12/03/15. 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 Also, the TVI ratio is 3.4, consistent with  pathologic obstruction. 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   ASSESSMENT AND PLAN:  1.  Junctional rhythm: Status post Medtronic ICD implanted 06/11/2016.  Device functioning appropriately.  No changes.    2.  Paroxysmal atrial fibrillation: Currently on Coumadin.  CHA2DS2-VASc of 3.  Found on device interrogation.  3.  PVCs: Currently on mexiletine, monitoring for high risk medication.  Unable to tell his overall burden at this time due to tachycardia.  4.  Mitral valve repair: Stable on most recent echo.  5.  Chronic systolic heart failure due to nonischemic cardiomyopathy: Status post Medtronic ICD.  Device functioning appropriately.  He is currently on Entresto.  He is significantly volume overloaded with JVD, PND, orthopnea, and dyspnea on exertion.  Due to his constellation of symptoms, abdominal bloating and decreased appetite, I do feel that this would best be managed in the hospital.  We Batoul Limes plan for a direct admission.  We Trayvond Viets get a basic metabolic, CBC, BNP, and magnesium today and prep for his admission.  He Kirsty Monjaraz likely need IV diuresis.  He Neyla Gauntt likely need to hold his Entresto and start rate controlling medications.  He Kasim Mccorkle also likely need an updated transthoracic echo.

## 2020-08-25 NOTE — ED Triage Notes (Signed)
Pt here , was a direct admit for [facility] but was cancelled due to having some chest pain so he came to the ED

## 2020-08-25 NOTE — Telephone Encounter (Signed)
    Ryan [name] would like for Dr. [name] know they are on there way to bring pt to [facility]. She said if there's any questions and recommendations too call her back

## 2020-08-25 NOTE — Telephone Encounter (Signed)
Ryan [name] is calling back requesting to speak with [name] in regards to their previous conversation. She states the ED provider she spoke with advised her to callback and request the bed be rebooked if possible. Please advise.

## 2020-08-25 NOTE — Patient Instructions (Addendum)
Medication Instructions:  Your physician has recommended you make the following change in your medication:  1. HOLD Entresto  *If you need a refill on your cardiac medications before your next appointment, please call your pharmacy*   Lab Work: Your physician recommends that you return for lab work today:  BMET, CBC, Magnesium & BNP   Testing/Procedures: None ordered   Follow-Up: At [facility], you and your health needs are our priority.  As part of our continuing mission to provide you with exceptional heart care, we have created designated Provider Care Teams.  These Care Teams include your primary Cardiologist (physician) and Advanced Practice Providers (APPs -  Physician Assistants and Nurse Practitioners) who all work together to provide you with the care you need, when you need it.  We recommend signing up for the patient portal called "MyChart".  Sign up information is provided on this After Visit Summary.  MyChart is used to connect with patients for Virtual Visits (Telemedicine).  Patients are able to view lab/test results, encounter notes, upcoming appointments, etc.  Non-urgent messages can be sent to your provider as well.   To learn more about what you can do with MyChart, go to [email or web address].    Remote monitoring is used to monitor your Pacemaker or ICD from home. This monitoring reduces the number of office visits required to check your device to one time per year. It allows [place] to keep an eye on the functioning of your device to ensure it is working properly. You are scheduled for a device check from home on 09/15/2020. You may send your transmission at any time that day. If you have a wireless device, the transmission will be sent automatically. After your physician reviews your transmission, you will receive a postcard with your next transmission date.  Your next appointment:    to be determined after your hospitalization  The format for your next appointment:    In Person  Provider:   Allegra Lai, MD   Thank you for choosing [facility]!!   Trinidad Curet, RN [phone]    Other Instructions [facility] will call you when a bed is available

## 2020-08-26 ENCOUNTER — Inpatient Hospital Stay (HOSPITAL_COMMUNITY): Payer: Medicare HMO

## 2020-08-26 DIAGNOSIS — I4510 Unspecified right bundle-branch block: Secondary | ICD-10-CM | POA: Diagnosis present

## 2020-08-26 DIAGNOSIS — Z7901 Long term (current) use of anticoagulants: Secondary | ICD-10-CM | POA: Diagnosis not present

## 2020-08-26 DIAGNOSIS — I5021 Acute systolic (congestive) heart failure: Secondary | ICD-10-CM | POA: Diagnosis not present

## 2020-08-26 DIAGNOSIS — Z8249 Family history of ischemic heart disease and other diseases of the circulatory system: Secondary | ICD-10-CM | POA: Diagnosis not present

## 2020-08-26 DIAGNOSIS — Z95810 Presence of automatic (implantable) cardiac defibrillator: Secondary | ICD-10-CM | POA: Diagnosis not present

## 2020-08-26 DIAGNOSIS — N183 Chronic kidney disease, stage 3 unspecified: Secondary | ICD-10-CM | POA: Insufficient documentation

## 2020-08-26 DIAGNOSIS — I130 Hypertensive heart and chronic kidney disease with heart failure and stage 1 through stage 4 chronic kidney disease, or unspecified chronic kidney disease: Secondary | ICD-10-CM | POA: Diagnosis present

## 2020-08-26 DIAGNOSIS — I272 Pulmonary hypertension, unspecified: Secondary | ICD-10-CM | POA: Diagnosis present

## 2020-08-26 DIAGNOSIS — E1122 Type 2 diabetes mellitus with diabetic chronic kidney disease: Secondary | ICD-10-CM | POA: Diagnosis present

## 2020-08-26 DIAGNOSIS — Z953 Presence of xenogenic heart valve: Secondary | ICD-10-CM | POA: Diagnosis not present

## 2020-08-26 DIAGNOSIS — I5041 Acute combined systolic (congestive) and diastolic (congestive) heart failure: Secondary | ICD-10-CM | POA: Diagnosis not present

## 2020-08-26 DIAGNOSIS — Z20822 Contact with and (suspected) exposure to covid-19: Secondary | ICD-10-CM | POA: Diagnosis present

## 2020-08-26 DIAGNOSIS — E876 Hypokalemia: Secondary | ICD-10-CM | POA: Diagnosis not present

## 2020-08-26 DIAGNOSIS — I471 Supraventricular tachycardia: Secondary | ICD-10-CM

## 2020-08-26 DIAGNOSIS — Z8679 Personal history of other diseases of the circulatory system: Secondary | ICD-10-CM | POA: Diagnosis not present

## 2020-08-26 DIAGNOSIS — I4892 Unspecified atrial flutter: Secondary | ICD-10-CM | POA: Diagnosis present

## 2020-08-26 DIAGNOSIS — I5082 Biventricular heart failure: Secondary | ICD-10-CM | POA: Diagnosis not present

## 2020-08-26 DIAGNOSIS — R079 Chest pain, unspecified: Secondary | ICD-10-CM | POA: Diagnosis not present

## 2020-08-26 DIAGNOSIS — Z79899 Other long term (current) drug therapy: Secondary | ICD-10-CM | POA: Diagnosis not present

## 2020-08-26 DIAGNOSIS — Z9889 Other specified postprocedural states: Secondary | ICD-10-CM

## 2020-08-26 DIAGNOSIS — I5023 Acute on chronic systolic (congestive) heart failure: Secondary | ICD-10-CM | POA: Diagnosis not present

## 2020-08-26 DIAGNOSIS — I071 Rheumatic tricuspid insufficiency: Secondary | ICD-10-CM | POA: Diagnosis present

## 2020-08-26 DIAGNOSIS — L03113 Cellulitis of right upper limb: Secondary | ICD-10-CM | POA: Diagnosis not present

## 2020-08-26 DIAGNOSIS — I509 Heart failure, unspecified: Secondary | ICD-10-CM

## 2020-08-26 DIAGNOSIS — N179 Acute kidney failure, unspecified: Secondary | ICD-10-CM | POA: Diagnosis not present

## 2020-08-26 DIAGNOSIS — Z8673 Personal history of transient ischemic attack (TIA), and cerebral infarction without residual deficits: Secondary | ICD-10-CM | POA: Diagnosis not present

## 2020-08-26 DIAGNOSIS — Z91013 Allergy to seafood: Secondary | ICD-10-CM | POA: Diagnosis not present

## 2020-08-26 DIAGNOSIS — E785 Hyperlipidemia, unspecified: Secondary | ICD-10-CM | POA: Diagnosis present

## 2020-08-26 DIAGNOSIS — Y92239 Unspecified place in hospital as the place of occurrence of the external cause: Secondary | ICD-10-CM | POA: Diagnosis not present

## 2020-08-26 DIAGNOSIS — I5043 Acute on chronic combined systolic (congestive) and diastolic (congestive) heart failure: Secondary | ICD-10-CM | POA: Diagnosis not present

## 2020-08-26 DIAGNOSIS — I420 Dilated cardiomyopathy: Secondary | ICD-10-CM | POA: Diagnosis present

## 2020-08-26 DIAGNOSIS — I4819 Other persistent atrial fibrillation: Secondary | ICD-10-CM | POA: Diagnosis present

## 2020-08-26 LAB — GLUCOSE, CAPILLARY: Glucose-Capillary: 112 mg/dL — ABNORMAL HIGH (ref 70–99)

## 2020-08-26 LAB — ECHOCARDIOGRAM COMPLETE
AR max vel: 2.03 cm2
AV Area VTI: 1.95 cm2
AV Area mean vel: 1.86 cm2
AV Mean grad: 1.7 mmHg
AV Peak grad: 2.7 mmHg
Ao pk vel: 0.82 m/s
Area-P 1/2: 3.73 cm2
Height: 72 in
S' Lateral: 4.1 cm
Weight: 3040 oz

## 2020-08-26 LAB — MAGNESIUM: Magnesium: 2.4 mg/dL (ref 1.7–2.4)

## 2020-08-26 LAB — BRAIN NATRIURETIC PEPTIDE: B Natriuretic Peptide: 2235.1 pg/mL — ABNORMAL HIGH (ref 0.0–100.0)

## 2020-08-26 LAB — PROTIME-INR
INR: 1.9 — ABNORMAL HIGH (ref 0.8–1.2)
Prothrombin Time: 21 seconds — ABNORMAL HIGH (ref 11.4–15.2)

## 2020-08-26 LAB — RESP PANEL BY RT-PCR (FLU A&B, COVID) ARPGX2
Influenza A by PCR: NEGATIVE
Influenza B by PCR: NEGATIVE
SARS Coronavirus 2 by RT PCR: NEGATIVE

## 2020-08-26 MED ORDER — TAMSULOSIN HCL 0.4 MG PO CAPS
0.4000 mg | ORAL_CAPSULE | Freq: Every day | ORAL | Status: DC
  Administered 2020-08-26 – 2020-09-06 (×12): 0.4 mg via ORAL
  Filled 2020-08-26 (×12): qty 1

## 2020-08-26 MED ORDER — PANTOPRAZOLE SODIUM 40 MG PO TBEC
40.0000 mg | DELAYED_RELEASE_TABLET | Freq: Every day | ORAL | Status: DC
  Administered 2020-08-26 – 2020-09-06 (×12): 40 mg via ORAL
  Filled 2020-08-26 (×12): qty 1

## 2020-08-26 MED ORDER — MEXILETINE HCL 250 MG PO CAPS
250.0000 mg | ORAL_CAPSULE | Freq: Two times a day (BID) | ORAL | Status: DC
  Administered 2020-08-26 – 2020-08-28 (×4): 250 mg via ORAL
  Filled 2020-08-26 (×4): qty 1

## 2020-08-26 MED ORDER — ACETAMINOPHEN 325 MG PO TABS
650.0000 mg | ORAL_TABLET | ORAL | Status: DC | PRN
  Administered 2020-08-26 – 2020-09-04 (×6): 650 mg via ORAL
  Filled 2020-08-26 (×6): qty 2

## 2020-08-26 MED ORDER — FUROSEMIDE 10 MG/ML IJ SOLN
80.0000 mg | Freq: Once | INTRAMUSCULAR | Status: AC
  Administered 2020-08-26: 80 mg via INTRAVENOUS
  Filled 2020-08-26: qty 8

## 2020-08-26 MED ORDER — ATORVASTATIN CALCIUM 40 MG PO TABS
40.0000 mg | ORAL_TABLET | Freq: Every day | ORAL | Status: DC
  Administered 2020-08-27 – 2020-09-05 (×10): 40 mg via ORAL
  Filled 2020-08-26 (×11): qty 1

## 2020-08-26 MED ORDER — FUROSEMIDE 10 MG/ML IJ SOLN
80.0000 mg | Freq: Two times a day (BID) | INTRAMUSCULAR | Status: DC
  Administered 2020-08-26 – 2020-08-28 (×4): 80 mg via INTRAVENOUS
  Filled 2020-08-26 (×4): qty 8

## 2020-08-26 MED ORDER — METOPROLOL TARTRATE 12.5 MG HALF TABLET
12.5000 mg | ORAL_TABLET | Freq: Two times a day (BID) | ORAL | Status: DC
  Administered 2020-08-26 – 2020-08-28 (×5): 12.5 mg via ORAL
  Filled 2020-08-26 (×5): qty 1

## 2020-08-26 MED ORDER — ADULT MULTIVITAMIN W/MINERALS CH
ORAL_TABLET | ORAL | Status: DC
  Administered 2020-08-27 – 2020-09-05 (×5): 1 via ORAL
  Filled 2020-08-26 (×5): qty 1

## 2020-08-26 MED ORDER — ONDANSETRON HCL 4 MG/2ML IJ SOLN
4.0000 mg | Freq: Four times a day (QID) | INTRAMUSCULAR | Status: DC | PRN
  Administered 2020-08-28: 4 mg via INTRAVENOUS
  Filled 2020-08-26: qty 2

## 2020-08-26 NOTE — ED Provider Notes (Signed)
[place] EMERGENCY DEPARTMENT Provider Note   CSN: [id number] Arrival date & time: 08/25/20  1338     History No chief complaint on file.   Ryan [name] is a 74 y.o. male.  Patient with history of dilated, nonischemic cardiomyopathy, atrial fibrillation, CHF with ejection fraction of 30 to 35% and bioprosthetic mitral valve replacement on Coumadin therapy presents to the emergency department from cardiology clinic.  Patient presented today with severe dyspnea on exertion, paroxysmal nocturnal dyspnea, orthopnea with significant swelling of his lower extremities.  Direct admission was initiated but patient developed chest pain and therefore could not go to the floor, has been in the waiting room for 16 hours.  At time of arrival to ER exam room, patient is not experiencing chest pain but is still short of breath.        Past Medical History:  Diagnosis Date  . Atrial fibrillation ([place])   . 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 Diabetes mellitus without complication ([place])   . Endocarditis   . Hypertension   . Peripheral vascular disease ([place])   . PVC's (premature ventricular contractions)   . SVT (supraventricular tachycardia)  long RP     Patient Active Problem List   Diagnosis Date Noted  . 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  . PVC's (premature ventricular contractions) 06/12/2016  . Junctional rhythm 09/16/2015  . SVT (supraventricular tachycardia)  long RP   . Atherosclerosis of native arteries of the extremities with ulceration ([place]) 10/23/2012  . Essential hypertension 09/29/2012    Class: Chronic  . Diabetes mellitus ([place]) 09/28/2012  . Status post mitral valve replacement with bioprosthetic valve 09/19/2012    Class: Acute  . Acute pericarditis 09/16/2012    Class: Acute  . Atrial fibrillation ([place]) 09/16/2012    Class: Acute  . Chronic systolic heart failure ([place]) 09/16/2012    Class: Acute  . Peripheral  vascular disease ([place]) 09/15/2012  . Acute ischemic stroke ([place]) 09/15/2012    Past Surgical History:  Procedure Laterality Date  . ABDOMINAL AORTAGRAM N/A 10/03/2012   Procedure: ABDOMINAL Maxcine Ham;  Surgeon: [name], MD;  Location: [facility] CATH LAB;  Service: Cardiovascular;  Laterality: N/A;  . CORONARY ANGIOGRAM  09/21/2012   Procedure: CORONARY ANGIOGRAM;  Surgeon: [name], MD;  Location: [facility] CATH LAB;  Service: Cardiovascular;;  . EP IMPLANTABLE DEVICE N/A 06/11/2016   Procedure: ICD Implant;  Surgeon: Will Meredith Leeds, MD;  Location: [place] CV LAB;  Service: Cardiovascular;  Laterality: N/A;  . EXTREMITY WIRE/PIN REMOVAL  09/14/2012   Procedure: REMOVAL K-WIRE/PIN EXTREMITY;  Surgeon: [name], MD;  Location: [place];  Service: Orthopedics;  Laterality: Right;  Right Foot  . I & D EXTREMITY  09/14/2012   Procedure: IRRIGATION AND DEBRIDEMENT EXTREMITY;  Surgeon: Tennis Must, MD;  Location: [place];  Service: Orthopedics;  Laterality: Right;  . INTRAOPERATIVE TRANSESOPHAGEAL ECHOCARDIOGRAM  09/26/2012   Procedure: INTRAOPERATIVE TRANSESOPHAGEAL ECHOCARDIOGRAM;  Surgeon: [name], MD;  Location: [facility] OR;  Service: Open Heart Surgery;  Laterality: N/A;  . MITRAL VALVE REPLACEMENT  09/26/2012   Procedure: MITRAL VALVE (MV) REPLACEMENT;  Surgeon: [name], MD;  Location: [place] OR;  Service: Open Heart Surgery;  Laterality: N/A;  . RIGHT HEART CATHETERIZATION  09/21/2012   Procedure: RIGHT HEART CATH;  Surgeon: [name], MD;  Location: [facility] CATH LAB;  Service: Cardiovascular;;  . TEE WITHOUT CARDIOVERSION  09/18/2012   Procedure: TRANSESOPHAGEAL ECHOCARDIOGRAM (  TEE);  Surgeon: [name], MD;  Location: [facility] ENDOSCOPY;  Service: Cardiovascular;  Laterality: N/A;       Family History  Problem Relation Age of Onset  . Hypertension Mother   . Hypertension Father     Social History   Tobacco Use  . Smoking status: Never Smoker  . Smokeless tobacco: Never  Used  Vaping Use  . Vaping Use: Never used  Substance Use Topics  . Alcohol use: No    Alcohol/week: 0.0 standard drinks  . Drug use: No    Home Medications Prior to Admission medications   Medication Sig Start Date End Date Taking? Authorizing Provider  atorvastatin (LIPITOR) 40 MG tablet Take 1 tablet (40 mg total) by mouth daily at 6 PM. 10/06/12   Timmothy Euler, MD  HYDROcodone-acetaminophen (NORCO/VICODIN) 5-325 MG per tablet Take 1 tablet by mouth 2 (two) times daily as needed for moderate pain.     [provider]  mexiletine (MEXITIL) 250 MG capsule TAKE 1 CAPSULE BY MOUTH 2 TIMES DAILY. 05/09/20   Camnitz, Ocie Doyne, MD  Multiple Vitamins-Minerals (CENTRUM SILVER PO) Take 1 tablet by mouth 3 (three) times a week.    [provider]  pantoprazole (PROTONIX) 40 MG tablet Take 40 mg by mouth daily.  11/12/14   [provider]  potassium chloride SA (K-DUR,KLOR-CON) 20 MEQ tablet Take 20 mEq by mouth daily.     [provider]  sacubitril-valsartan (ENTRESTO) 24-26 MG Take 1 tablet by mouth 2 (two) times daily. 07/22/20   Camnitz, Ocie Doyne, MD  tamsulosin (FLOMAX) 0.4 MG CAPS capsule Take 1 capsule by mouth daily. 07/17/18   [provider]  triamcinolone cream (KENALOG) 0.5 % Apply 1 application topically as needed (SKIN).  02/15/14   [provider]  Warfarin Sodium (COUMADIN PO) Take by mouth. Take as directed    [provider]    [place] [fish allergy]  Review of Systems   Review of Systems  Respiratory: Positive for shortness of breath.   Cardiovascular: Positive for chest pain and leg swelling.  All other systems reviewed and are negative.   Physical Exam Updated Vital Signs BP 112/85 (BP Location: Left Arm)   Pulse (!) 131   Temp 98.2 F (36.8 C)   Resp 17   SpO2 100%   Physical Exam Vitals and nursing note reviewed.  Constitutional:      General: He is not in acute distress.     Appearance: Normal appearance. He is well-developed.  HENT:     Head: Normocephalic and atraumatic.     Right Ear: Hearing normal.     Left Ear: Hearing normal.     Nose: Nose normal.  Eyes:     Conjunctiva/sclera: Conjunctivae normal.     Pupils: Pupils are equal, round, and reactive to light.  Neck:     Vascular: JVD present.  Cardiovascular:     Rate and Rhythm: Regular rhythm. Tachycardia present.     Heart sounds: S1 normal and S2 normal. No murmur heard.  No friction rub. No gallop.   Pulmonary:     Effort: Pulmonary effort is normal. Tachypnea present. No respiratory distress.     Breath sounds: Rales present.  Chest:     Chest wall: No tenderness.  Abdominal:     General: Bowel sounds are normal.     Palpations: Abdomen is soft.     Tenderness: There is no abdominal tenderness. There is no guarding  or rebound. Negative signs include Murphy's sign and McBurney's sign.     Hernia: No hernia is present.  Musculoskeletal:        General: Normal range of motion.     Cervical back: Normal range of motion and neck supple.     Right lower leg: 3+ Edema present.     Left lower leg: 3+ Edema present.  Skin:    General: Skin is warm and dry.     Findings: No rash.  Neurological:     Mental Status: He is alert and oriented to person, place, and time.     GCS: GCS eye subscore is 4. GCS verbal subscore is 5. GCS motor subscore is 6.     Cranial Nerves: No cranial nerve deficit.     Sensory: No sensory deficit.     Coordination: Coordination normal.  Psychiatric:        Speech: Speech normal.        Behavior: Behavior normal.        Thought Content: Thought content normal.     ED Results / Procedures / Treatments   Labs (all labs ordered are listed, but only abnormal results are displayed) Labs Reviewed  BASIC METABOLIC PANEL - Abnormal; Notable for the following components:      Result Value   Chloride 116 (*)    CO2 18 (*)    Glucose, Bld 110 (*)    BUN 30 (*)     Creatinine, Ser 1.78 (*)    GFR, Estimated 39 (*)    All other components within normal limits  CBC - Abnormal; Notable for the following components:   Hemoglobin 11.5 (*)    HCT 38.0 (*)    MCV 75.4 (*)    MCH 22.8 (*)    RDW 19.7 (*)    All other components within normal limits  TROPONIN I (HIGH SENSITIVITY) - Abnormal; Notable for the following components:   Troponin I (High Sensitivity) 25 (*)    All other components within normal limits  TROPONIN I (HIGH SENSITIVITY) - Abnormal; Notable for the following components:   Troponin I (High Sensitivity) 30 (*)    All other components within normal limits  RESP PANEL BY RT-PCR (FLU A&B, COVID) ARPGX2  PROTIME-INR  BRAIN NATRIURETIC PEPTIDE  MAGNESIUM    EKG EKG Interpretation  Date/Time:  Monday August 25 2020 13:45:51 EST Ventricular Rate:  131 PR Interval:    QRS Duration: 92 QT Interval:  326 QTC Calculation: 481 R Axis:   94 Text Interpretation: Supraventricular tachycardia with frequent Premature ventricular complexes Rightward axis Incomplete right bundle branch block Nonspecific ST and T wave abnormality Abnormal ECG No significant change since last tracing Confirmed by Orpah Greek [phone]) on 08/26/2020 5:12:01 AM   Radiology DG Chest 2 View  Result Date: 08/25/2020 CLINICAL DATA:  Chest pain. EXAM: CHEST - 2 VIEW COMPARISON:  June 12, 2016. FINDINGS: Stable cardiomegaly. Status post cardiac valve repair. Left-sided pacemaker is unchanged in position. No pneumothorax is noted. Minimal bibasilar subsegmental atelectasis is noted with small pleural effusions. Bony thorax is unremarkable. IMPRESSION: Minimal bibasilar subsegmental atelectasis with small pleural effusions. Electronically Signed   By: Marijo Conception M.D.   On: 08/25/2020 14:26   CUP PACEART INCLINIC DEVICE CHECK  Result Date: 08/25/2020 ICD check in clinic. Normal device function. Unable to check threshold testing due to AS?VS rate of 133 .  Impedance trends stable over time. AT/AF burden 0.3%, 7 AMS events with EGMs  that show AT, longest 43 minutes. No ventricular arrhythmias. Histogram  distribution appropriate for patient and level of activity. No changes made this session. Device programmed at appropriate safety margins. Optivol elevated and patient symptomatic WC aware of findings.Device programmed to optimize intrinsic conduction. Estimated longevity 5 years, 3 months. Pt enrolled in remote follow-up and next remote 09/15/20. Patient education completed including shock plan. Auditory/vibratory alert demonstrated.   Procedures Procedures (including critical care time)  Medications Ordered in ED Medications  furosemide (LASIX) injection 80 mg (has no administration in time range)    ED Course  I have reviewed the triage vital signs and the nursing notes.  Pertinent labs & imaging results that were available during my care of the patient were reviewed by me and considered in my medical decision making (see chart for details).    MDM Rules/Calculators/A&P                          Patient presents to the emergency department for admission.  Patient evaluated in cardiology clinic today and found to be severely volume overloaded.  He could not be direct admitted to the floor because he had chest pain at time of arrival to the hospital.  He has sat in the waiting room for 16 hours waiting for evaluation due to prolonged wait times.  At time of my evaluation, patient clearly is volume overloaded but he is not experiencing chest pain.  EKG with accelerated rhythm but no obvious ischemia.  Troponin is slightly elevated consistent with troponin leak.  Initiate diuresis, admit.  Final Clinical Impression(s) / ED Diagnoses Final diagnoses:  Acute on chronic congestive heart failure, unspecified heart failure type [facility])    Rx / DC Orders ED Discharge Orders    None       Selvin Yun, Gwenyth Allegra, MD 08/26/20 [phone]

## 2020-08-26 NOTE — ED Notes (Signed)
RN notified about vitals 

## 2020-08-26 NOTE — Progress Notes (Signed)
ReDS Clip Diuretic Study Pt study # [id number]  Your patient is in the Blinded arm of the ReDS Clip Diuretic study.  Your patient has had a ReDS reading and the reading has been transmitted to the cloud.   Thank You   The research team   Kerby Nora, PharmD, BCPS Heart Failure Stewardship Pharmacist Phone [phone]  Please check [email or web address] for unit-specific pharmacist phone numbers

## 2020-08-26 NOTE — ED Notes (Signed)
Lunch Tray Ordered @ 1036. 

## 2020-08-26 NOTE — H&P (Signed)
Cardiology Admission History and Physical:   Patient ID: Ryan [name] MRN: [id number]; DOB: 07-31-1946   Admission date: 08/25/2020  Primary Care Provider: [name], MD [facility] Cardiologist: [name], MD  [facility] Electrophysiologist:  Will Meredith Leeds, MD   Chief Complaint:  Shortness of breath, chest pain  Patient Profile:   Ryan [name] is a 74 y.o. male with a history of endocarditis s/p mitral valve replacement in 2014, atrial fibrillation/flutter, HTN, DM2, and chronic systolic heart failure. He followed with EP for significant PVC burden (20%) with an EF of 30-35% and accelerated junctional rhythm - status post medtronic ICD.   History of Present Illness:   Mr. Ryan [name] has a history of PAD with known right popliteal artery occlusion found on angiography 2013. He is followed by [facility] and was last seen 04/09/20 and was doing well at that time. He has a history of endocarditis that resulted in mitral valve replacement in 2014. Heart cath prior to that surgery showed widely patent coronaries and moderate to severe pulmonary hypertension. Diuretic was resumed. He also has a noted history of PAF (found on device interrogation) and PVCs (20% burden) now on mexilitine and followed by EP - medtronic ICD in place. He has chronic systolic heart failure with EF of 30-35% by echo 07/03/19. He also has a history of embolic CVA and is chronically anticoagulated with  Coumadin. He has some baseline chronic DOE.   He was seen in EP clinic 07/22/20 and noted to be volume up with elevated optivol reading. He has been taken off of his ARB-HCTZ and toprol by PCP due to marginal pressure. Given his SOB, Dr. [name] started low dose entresto in a attempt to improve his CHF exacerbation in the outpatient setting.  He was seen in EP clinic yesterday 08/25/20 by Dr. [name] for follow up. He reported significant shortness of breath with minimal exertion, PND, and orthopnea. He has been  sleeping upright in a chair. He also reported lower extremity edema and abdominal bloating. Dr. [name] recommended some basic lab work and direct admission to [facility].   Workup prior to coming to the ER: Pro-BNP > 21000 Mg 2.4 Hb 11.9 sCr 1.76 with K 4.8  Mr. Roam presented for direct admission, but reported new onset chest pain and was therefore directed to [facility]. EDP noted he has been in the waiting room for 16 hrs as of 0530 today. During my interview, he is unsure when his shortness of breath started, but likely at least a month per EP notes. He does report chest pain in the center of his chest yesterday while resting, is unsure about timing. CP lasted about 15 minutes before improving, but continued to wax and wane yesterday. He does report having this chest pain before, but denies CP during exertion. He thinks the chest pain might be related to his fluid status. Exertion has only causes shortness of breath. CP radiates to his back at times. He has not had recurrence of chest pain since being roomed in the ER. He denies awareness of tachycardic rate in the 130s, but occasionally feels palpitations. He last took cardiac medications yesterday morning.     Past Medical History:  Diagnosis Date  . Atrial fibrillation ([place])   . 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 Diabetes mellitus without complication ([place])   . Endocarditis   . Hypertension   . Peripheral vascular disease ([place])   . PVC's (premature ventricular contractions)   . SVT (supraventricular tachycardia)  long RP     Past Surgical History:  Procedure Laterality Date  . ABDOMINAL AORTAGRAM N/A 10/03/2012   Procedure: ABDOMINAL Maxcine Ham;  Surgeon: [name], MD;  Location: [facility] CATH LAB;  Service: Cardiovascular;  Laterality: N/A;  . CORONARY ANGIOGRAM  09/21/2012   Procedure: CORONARY ANGIOGRAM;  Surgeon: [name], MD;  Location: [facility] CATH LAB;  Service: Cardiovascular;;  . EP IMPLANTABLE DEVICE N/A 06/11/2016   Procedure:  ICD Implant;  Surgeon: Will Meredith Leeds, MD;  Location: [place] CV LAB;  Service: Cardiovascular;  Laterality: N/A;  . EXTREMITY WIRE/PIN REMOVAL  09/14/2012   Procedure: REMOVAL K-WIRE/PIN EXTREMITY;  Surgeon: [name], MD;  Location: [place];  Service: Orthopedics;  Laterality: Right;  Right Foot  . I & D EXTREMITY  09/14/2012   Procedure: IRRIGATION AND DEBRIDEMENT EXTREMITY;  Surgeon: Tennis Must, MD;  Location: [place];  Service: Orthopedics;  Laterality: Right;  . INTRAOPERATIVE TRANSESOPHAGEAL ECHOCARDIOGRAM  09/26/2012   Procedure: INTRAOPERATIVE TRANSESOPHAGEAL ECHOCARDIOGRAM;  Surgeon: [name], MD;  Location[facility] OR;  Service: Open Heart Surgery;  Laterality: N/A;  . MITRAL VALVE REPLACEMENT  09/26/2012   Procedure: MITRAL VALVE (MV) REPLACEMENT;  Surgeon: [name], MD;  Location: [place] OR;  Service: Open Heart Surgery;  Laterality: N/A;  . RIGHT HEART CATHETERIZATION  09/21/2012   Procedure: RIGHT HEART CATH;  Surgeon: [name], MD;  Location: [facility] CATH LAB;  Service: Cardiovascular;;  . TEE WITHOUT CARDIOVERSION  09/18/2012   Procedure: TRANSESOPHAGEAL ECHOCARDIOGRAM (TEE);  Surgeon: [name], MD;  Location: [facility] ENDOSCOPY;  Service: Cardiovascular;  Laterality: N/A;     Medications Prior to Admission: Prior to Admission medications   Medication Sig Start Date End Date Taking? Authorizing Provider  atorvastatin (LIPITOR) 40 MG tablet Take 1 tablet (40 mg total) by mouth daily at 6 PM. 10/06/12   Timmothy Euler, MD  HYDROcodone-acetaminophen (NORCO/VICODIN) 5-325 MG per tablet Take 1 tablet by mouth 2 (two) times daily as needed for moderate pain.     [provider]  mexiletine (MEXITIL) 250 MG capsule TAKE 1 CAPSULE BY MOUTH 2 TIMES DAILY. 05/09/20   Camnitz, Ocie Doyne, MD  Multiple Vitamins-Minerals (CENTRUM SILVER PO) Take 1 tablet by mouth 3 (three) times a week.    [provider]  pantoprazole (PROTONIX) 40 MG tablet Take 40 mg by  mouth daily.  11/12/14   [provider]  potassium chloride SA (K-DUR,KLOR-CON) 20 MEQ tablet Take 20 mEq by mouth daily.     [provider]  sacubitril-valsartan (ENTRESTO) 24-26 MG Take 1 tablet by mouth 2 (two) times daily. 07/22/20   Camnitz, Ocie Doyne, MD  tamsulosin (FLOMAX) 0.4 MG CAPS capsule Take 1 capsule by mouth daily. 07/17/18   [provider]  triamcinolone cream (KENALOG) 0.5 % Apply 1 application topically as needed (SKIN).  02/15/14   [provider]  Warfarin Sodium (COUMADIN PO) Take by mouth. Take as directed    [provider]     Allergies:    Allergies  Allergen Reactions  . Geralyn Flash [Fish Allergy] Nausea And Vomiting    Social History:   Social History   Socioeconomic History  . Marital status: Legally Separated    Spouse name: Not on file  . Number of children: 3  . Years of education: Not on file  . Highest education level: Not on file  Occupational History  . Not on file  Tobacco Use  . Smoking  status: Never Smoker  . Smokeless tobacco: Never Used  Vaping Use  . Vaping Use: Never used  Substance and Sexual Activity  . Alcohol use: No    Alcohol/week: 0.0 standard drinks  . Drug use: No  . Sexual activity: Not on file  Other Topics Concern  . Not on file  Social History Narrative   Daughter lives with him.  Retired Advertising account planner   Social Determinants of Radio broadcast assistant Strain:   . Difficulty of Paying Living Expenses: Not on file  Food Insecurity:   . Worried About Charity fundraiser in the Last Year: Not on file  . Ran Out of Food in the Last Year: Not on file  Transportation Needs:   . Lack of Transportation (Medical): Not on file  . Lack of Transportation (Non-Medical): Not on file  Physical Activity:   . Days of Exercise per Week: Not on file  . Minutes of Exercise per Session: Not on file  Stress:   . Feeling of Stress : Not on file  Social Connections:   . Frequency of  Communication with Friends and Family: Not on file  . Frequency of Social Gatherings with Friends and Family: Not on file  . Attends Religious Services: Not on file  . Active Member of Clubs or Organizations: Not on file  . Attends Archivist Meetings: Not on file  . Marital Status: Not on file  Intimate Partner Violence:   . Fear of Current or Ex-Partner: Not on file  . Emotionally Abused: Not on file  . Physically Abused: Not on file  . Sexually Abused: Not on file    Family History:   The patient's family history includes Hypertension in his father and mother.    ROS:  Please see the history of present illness.  All other ROS reviewed and negative.     Physical Exam/Data:   Vitals:   08/26/20 0456 08/26/20 0530 08/26/20 0545 08/26/20 0645  BP: 112/85 117/84 112/85 116/87  Pulse: (!) 131 (!) 134 (!) 134 (!) 131  Resp: 17 (!) 22 (!) 26 (!) 25  Temp: 98.2 F (36.8 C)     TempSrc:      SpO2: 100% 99% 100% 98%    Intake/Output Summary (Last 24 hours) at 08/26/2020 0829 Last data filed at 08/26/2020 0757 Gross per 24 hour  Intake --  Output 700 ml  Net -700 ml   Last 3 Weights 08/25/2020 07/22/2020 04/09/2020  Weight (lbs) 191 lb 180 lb 9.6 oz 182 lb 4.8 oz  Weight (kg) 86.637 kg 81.92 kg 82.691 kg     There is no height or weight on file to calculate BMI.  General:  Well nourished, well developed, in no acute distress HEENT: normal Neck: + JVD Vascular: No carotid bruits Cardiac:  Irregular rhythm, tachycardic rate Lungs:  clear to auscultation bilaterally, no wheezing, rhonchi or rales  Abd: soft, nontender, no hepatomegaly  Ext: 1-2+ B LE edema Musculoskeletal:  No deformities, BUE and BLE strength normal and equal Skin: warm and dry  Neuro:  CNs 2-12 intact, no focal abnormalities noted Psych:  Normal affect    EKG:  The ECG that was done was personally reviewed and demonstrates narrow complex tachycardia with ventricular rate 131 SVT vs  fib/flutter  Relevant CV Studies:  ICD interrogation 08/25/20: ICD check in clinic. Normal device function. Unable to check threshold testing due to AS?VS rate of 133 . Impedance trends stable over time. AT/AF  burden 0.3%, 7 AMS events with EGMs that show AT, longest 43 minutes. No ventricular arrhythmias. Histogram  distribution appropriate for patient and level of activity. No changes made this session. Device programmed at appropriate safety margins. Optivol elevated and patient symptomatic WC aware of findings.Device programmed to optimize intrinsic conduction.  Estimated longevity 5 years, 3 months.   Echo 07/03/19: 1. Left ventricular ejection fraction, by visual estimation, is 30 to  35%. The left ventricle has normal function. Normal left ventricular size.  There is no left ventricular hypertrophy.  2. Global right ventricle has normal systolic function.The right  ventricular size is moderately enlarged. No increase in right ventricular  wall thickness.  3. Left atrial size was normal.  4. Right atrial size was normal.  5. The mitral valve has been repaired/replaced. No evidence of mitral  valve regurgitation. No evidence of mitral stenosis.  6. Bioprosthetic mitral valve.   Mean gradient 107mmHg.  7. The tricuspid valve is normal in structure. Tricuspid valve  regurgitation is mild.  8. The aortic valve is normal in structure. Aortic valve regurgitation  was not visualized by color flow Doppler. Structurally normal aortic  valve, with no evidence of sclerosis or stenosis.  9. The pulmonic valve was normal in structure. Pulmonic valve  regurgitation is not visualized by color flow Doppler.  10. Moderately elevated pulmonary artery systolic pressure.  11. A pacer wire is visualized in the RA and RV.  12. The inferior vena cava is normal in size with greater than 50%  respiratory variability, suggesting right atrial pressure of 3 mmHg.    Laboratory Data:  High  Sensitivity Troponin:   Recent Labs  Lab 08/25/20 1419 08/25/20 2112  TROPONINIHS 25* 30*      Chemistry Recent Labs  Lab 08/25/20 1121 08/25/20 1419  NA 144 143  K 4.8 4.5  CL 113* 116*  CO2 18* 18*  GLUCOSE 132* 110*  BUN 30* 30*  CREATININE 1.76* 1.78*  CALCIUM 8.8 9.2  GFRNONAA 37* 39*  GFRAA 42*  --   ANIONGAP  --  9    No results for input(s): PROT, ALBUMIN, AST, ALT, ALKPHOS, BILITOT in the last 168 hours. Hematology Recent Labs  Lab 08/25/20 1121 08/25/20 1419  WBC 5.1 5.2  RBC 5.02 5.04  HGB 11.9* 11.5*  HCT 37.9 38.0*  MCV 76* 75.4*  MCH 23.7* 22.8*  MCHC 31.4* 30.3  RDW 20.0* 19.7*  PLT 245 328   BNP Recent Labs  Lab 08/25/20 1121 08/26/20 0622  BNP  --  2,235.1*  PROBNP 21,395*  --     DDimer No results for input(s): DDIMER in the last 168 hours.   Radiology/Studies:  DG Chest 2 View  Result Date: 08/25/2020 CLINICAL DATA:  Chest pain. EXAM: CHEST - 2 VIEW COMPARISON:  June 12, 2016. FINDINGS: Stable cardiomegaly. Status post cardiac valve repair. Left-sided pacemaker is unchanged in position. No pneumothorax is noted. Minimal bibasilar subsegmental atelectasis is noted with small pleural effusions. Bony thorax is unremarkable. IMPRESSION: Minimal bibasilar subsegmental atelectasis with small pleural effusions. Electronically Signed   By: Marijo Conception M.D.   On: 08/25/2020 14:26   CUP PACEART INCLINIC DEVICE CHECK  Result Date: 08/25/2020 ICD check in clinic. Normal device function. Unable to check threshold testing due to AS?VS rate of 133 . Impedance trends stable over time. AT/AF burden 0.3%, 7 AMS events with EGMs that show AT, longest 43 minutes. No ventricular arrhythmias. Histogram  distribution appropriate for patient and level  of activity. No changes made this session. Device programmed at appropriate safety margins. Optivol elevated and patient symptomatic WC aware of findings.Device programmed to optimize intrinsic conduction.  Estimated longevity 5 years, 3 months. Pt enrolled in remote follow-up and next remote 09/15/20. Patient education completed including shock plan. Auditory/vibratory alert demonstrated. ICD check in clinic. Normal device function. Unable to check threshold testing due to AS?VS rate of 133 . Impedance trends stable over time. AT/AF burden 0.3%, 7 AMS events with EGMs that show AT, longest 43 minutes. No ventricular arrhythmias. Histogram  distribution appropriate for patient and level of activity. No changes made this session. Device programmed at appropriate safety margins. Optivol elevated and patient symptomatic WC aware of findings.Device programmed to optimize intrinsic conduction. Estimated longevity 5 years, 3 months. Pt enrolled in remote follow-up and next remote 09/15/20. Patient education completed including shock plan. Auditory/vibratory alert demonstrated.    Assessment and Plan:   Acute on chronic systolic heart failure - EF 30-35% on 06/2019 - optivol was elevated  - he has been maintained on 24-26 mg BID entresto - he has received 80 mg IV lasix x 1 with 700 cc output recorded  - on exam, he appears volume overloaded - sCr 1.78 - will admit with additional 40 mg IV lasix BID - repeat an echocardiogram - will discuss with attending continuation of entresto - sBP 110s - PCP D/C'ed ARB-HCTZ and BB --> Dr. [name] restarted low dose entresto - consider restarting low dose toprol   Chest pain - widely patent coronaries 2014 - hs troponin 25 --> 30 - EKG appears to be a narrow complex tachycardia SVT vs fib/flutter HR in the 130s - pt states he has had chest pain before, denies exertional chest pain, although details are somewhat unclear - echo as above   ICD in place PVCs Persistent atrial fibrillation RVR - device interrogation yesterday with Afib burden 0.3% - telemetry today with what appears to be atrial flutter RVR - maintained on 250 mg BID mexiletine - will continue  this - as above, consider restarting low dose BB   Chronic anticoagulation - has been on coumadin - INR 1.9 - will start heparin gtt once INR < 1.5   Hx of endocarditis s/p mitral valve replacement (2014) - repeat echo as above    Hyperlipidemia - continue 40 mg lipitor   Acute on chronic kidney disease stage III - sCr 1.78 (1.76) - baseline appears to be 1.5-1.7           New York Heart Association (NYHA) Functional Class NYHA Class III   CHA2DS2-VASc Score = 6  This indicates a 9.7% annual risk of stroke. The patient's score is based upon: CHF History: 1 HTN History: 1 Diabetes History: 0 Stroke History: 2 Vascular Disease History: 0 Age Score: 2 Gender Score: 0    Severity of Illness: The appropriate patient status for this patient is INPATIENT. Inpatient status is judged to be reasonable and necessary in order to provide the required intensity of service to ensure the patient's safety. The patient's presenting symptoms, physical exam findings, and initial radiographic and laboratory data in the context of their chronic comorbidities is felt to place them at high risk for further clinical deterioration. Furthermore, it is not anticipated that the patient will be medically stable for discharge from the hospital within 2 midnights of admission. The following factors support the patient status of inpatient.   " The patient's presenting symptoms include dyspnea. " The worrisome physical exam  findings include Afib. " The initial radiographic and laboratory data are worrisome because of CHF. " The chronic co-morbidities include CHF.   * I certify that at the point of admission it is my clinical judgment that the patient will require inpatient hospital care spanning beyond 2 midnights from the point of admission due to high intensity of service, high risk for further deterioration and high frequency of surveillance required.*    For questions or updates, please  contact [facility] Please consult [email or web address] for contact info under     Signed, Ledora Bottcher, PA  08/26/2020 8:29 AM

## 2020-08-26 NOTE — Progress Notes (Signed)
  Echocardiogram 2D Echocardiogram has been performed.  Ryan [name] 08/26/2020, 4:42 PM

## 2020-08-27 DIAGNOSIS — I5023 Acute on chronic systolic (congestive) heart failure: Secondary | ICD-10-CM

## 2020-08-27 DIAGNOSIS — I5043 Acute on chronic combined systolic (congestive) and diastolic (congestive) heart failure: Secondary | ICD-10-CM

## 2020-08-27 LAB — LIPID PANEL
Cholesterol: 131 mg/dL (ref 0–200)
HDL: 40 mg/dL — ABNORMAL LOW (ref >40)
LDL Cholesterol: 83 mg/dL (ref 0–99)
Total CHOL/HDL Ratio: 3.3 RATIO
Triglycerides: 42 mg/dL (ref <150)
VLDL: 8 mg/dL (ref 0–40)

## 2020-08-27 LAB — CBC
HCT: 33.8 % — ABNORMAL LOW (ref 39.0–52.0)
Hemoglobin: 11 g/dL — ABNORMAL LOW (ref 13.0–17.0)
MCH: 23.7 pg — ABNORMAL LOW (ref 26.0–34.0)
MCHC: 32.5 g/dL (ref 30.0–36.0)
MCV: 72.8 fL — ABNORMAL LOW (ref 80.0–100.0)
Platelets: 119 10*3/uL — ABNORMAL LOW (ref 150–400)
RBC: 4.64 MIL/uL (ref 4.22–5.81)
RDW: 18.5 % — ABNORMAL HIGH (ref 11.5–15.5)
WBC: 5 10*3/uL (ref 4.0–10.5)
nRBC: 0 % (ref 0.0–0.2)

## 2020-08-27 LAB — BASIC METABOLIC PANEL
Anion gap: 10 (ref 5–15)
BUN: 38 mg/dL — ABNORMAL HIGH (ref 8–23)
CO2: 20 mmol/L — ABNORMAL LOW (ref 22–32)
Calcium: 8.9 mg/dL (ref 8.9–10.3)
Chloride: 110 mmol/L (ref 98–111)
Creatinine, Ser: 1.91 mg/dL — ABNORMAL HIGH (ref 0.61–1.24)
GFR, Estimated: 36 mL/min — ABNORMAL LOW (ref >60)
Glucose, Bld: 97 mg/dL (ref 70–99)
Potassium: 4.2 mmol/L (ref 3.5–5.1)
Sodium: 140 mmol/L (ref 135–145)

## 2020-08-27 LAB — GLUCOSE, CAPILLARY
Glucose-Capillary: 124 mg/dL — ABNORMAL HIGH (ref 70–99)
Glucose-Capillary: 133 mg/dL — ABNORMAL HIGH (ref 70–99)
Glucose-Capillary: 131 mg/dL — ABNORMAL HIGH (ref 70–99)

## 2020-08-27 LAB — PROTIME-INR
INR: 1.9 — ABNORMAL HIGH (ref 0.8–1.2)
Prothrombin Time: 21.4 seconds — ABNORMAL HIGH (ref 11.4–15.2)

## 2020-08-27 MED ORDER — WARFARIN - PHARMACIST DOSING INPATIENT: Freq: Every day | Status: DC

## 2020-08-27 MED ORDER — AMIODARONE HCL IN DEXTROSE 360-4.14 MG/200ML-% IV SOLN
60.0000 mg/h | INTRAVENOUS | Status: DC
  Administered 2020-08-27: 60 mg/h via INTRAVENOUS
  Filled 2020-08-27: qty 200

## 2020-08-27 MED ORDER — WARFARIN SODIUM 7.5 MG PO TABS
7.5000 mg | ORAL_TABLET | Freq: Once | ORAL | Status: AC
  Administered 2020-08-27: 7.5 mg via ORAL
  Filled 2020-08-27: qty 1

## 2020-08-27 MED ORDER — AMIODARONE HCL IN DEXTROSE 360-4.14 MG/200ML-% IV SOLN
30.0000 mg/h | INTRAVENOUS | Status: DC
  Administered 2020-08-27 – 2020-08-28 (×3): 30 mg/h via INTRAVENOUS
  Filled 2020-08-27 (×2): qty 200

## 2020-08-27 NOTE — Progress Notes (Signed)
Progress Note  Patient Name: Ryan [name] Date of Encounter: 08/27/2020  [facility] Cardiologist: [name], MD   Subjective   Feeling better.  Breathing is improving.  Denies any palpitations, lightheadedness, or dizziness.  Inpatient Medications    Scheduled Meds: . atorvastatin  40 mg Oral q1800  . furosemide  80 mg Intravenous BID  . metoprolol tartrate  12.5 mg Oral BID  . mexiletine  250 mg Oral Q12H  . multivitamin with minerals   Oral Once per day on Mon Wed Fri  . pantoprazole  40 mg Oral Daily  . tamsulosin  0.4 mg Oral Daily   Continuous Infusions:  PRN Meds: acetaminophen, ondansetron (ZOFRAN) IV   Vital Signs    Vitals:   08/26/20 1720 08/26/20 2117 08/27/20 0600 08/27/20 0748  BP: 105/76 101/73  103/70  Pulse: 77 60  74  Resp: 17   20  Temp: 97.6 F (36.4 C) 97.6 F (36.4 C)  98.4 F (36.9 C)  TempSrc: Oral Oral  Oral  SpO2: 100% 100%  98%  Weight: 80.9 kg  81.2 kg   Height: 6' (1.829 m)       Intake/Output Summary (Last 24 hours) at 08/27/2020 1028 Last data filed at 08/27/2020 6222 Gross per 24 hour  Intake 600 ml  Output 1560 ml  Net -960 ml   Last 3 Weights 08/27/2020 08/26/2020 08/26/2020  Weight (lbs) 179 lb 0.2 oz 178 lb 5.6 oz 190 lb  Weight (kg) 81.2 kg 80.9 kg 86.183 kg      Telemetry    Junctional tachycardia with rates in the 100-120s he was in sinus rhythm for several hours this morning but is back in junctional tachycardia with intermittent paced beats this morning- Personally Reviewed  ECG    No new tracings - Personally Reviewed  Physical Exam   VS:  BP 103/70 (BP Location: Left Arm)   Pulse 74   Temp 98.4 F (36.9 C) (Oral)   Resp 20   Ht 6' (1.829 m)   Wt 81.2 kg   SpO2 98%   BMI 24.28 kg/m  , BMI Body mass index is 24.28 kg/m. GENERAL:  Well appearing HEENT: Pupils equal round and reactive, fundi not visualized, oral mucosa unremarkable NECK:  + jugular venous distention with HJR, waveform  within normal limits, carotid upstroke brisk and symmetric, no bruit LUNGS: Bibasilar crackles HEART: Tachycardic.  Irregularly irregular.  PMI not displaced or sustained,S1 and S2 within normal limits, no S3, no S4, no clicks, no rubs, II/VI systolic murmur at the left lower sternal border ABD:  Flat, positive bowel sounds normal in frequency in pitch, no bruits, no rebound, no guarding, no midline pulsatile mass, no hepatomegaly, no splenomegaly EXT:  2 plus pulses throughout, 1+ edema, no cyanosis no clubbing SKIN:  No rashes no nodules NEURO:  Cranial nerves II through XII grossly intact, motor grossly intact throughout PSYCH:  Cognitively intact, oriented to person place and time   Labs    High Sensitivity Troponin:   Recent Labs  Lab 08/25/20 1419 08/25/20 2112  TROPONINIHS 25* 30*      Chemistry Recent Labs  Lab 08/25/20 1121 08/25/20 1419 08/27/20 0108  NA 144 143 140  K 4.8 4.5 4.2  CL 113* 116* 110  CO2 18* 18* 20*  GLUCOSE 132* 110* 97  BUN 30* 30* 38*  CREATININE 1.76* 1.78* 1.91*  CALCIUM 8.8 9.2 8.9  GFRNONAA 37* 39* 36*  GFRAA 42*  --   --  ANIONGAP  --  9 10     Hematology Recent Labs  Lab 08/25/20 1121 08/25/20 1419 08/27/20 0108  WBC 5.1 5.2 5.0  RBC 5.02 5.04 4.64  HGB 11.9* 11.5* 11.0*  HCT 37.9 38.0* 33.8*  MCV 76* 75.4* 72.8*  MCH 23.7* 22.8* 23.7*  MCHC 31.4* 30.3 32.5  RDW 20.0* 19.7* 18.5*  PLT 245 328 119*    BNP Recent Labs  Lab 08/25/20 1121 08/26/20 0622  BNP  --  2,235.1*  PROBNP 21,395*  --      DDimer No results for input(s): DDIMER in the last 168 hours.   Radiology    DG Chest 2 View  Result Date: 08/25/2020 CLINICAL DATA:  Chest pain. EXAM: CHEST - 2 VIEW COMPARISON:  June 12, 2016. FINDINGS: Stable cardiomegaly. Status post cardiac valve repair. Left-sided pacemaker is unchanged in position. No pneumothorax is noted. Minimal bibasilar subsegmental atelectasis is noted with small pleural effusions. Bony  thorax is unremarkable. IMPRESSION: Minimal bibasilar subsegmental atelectasis with small pleural effusions. Electronically Signed   By: Marijo Conception M.D.   On: 08/25/2020 14:26   ECHOCARDIOGRAM COMPLETE  Result Date: 08/26/2020    ECHOCARDIOGRAM REPORT   Patient Name:   Ryan [name] Date of Exam: 08/26/2020 Medical Rec #:  [id number]        Height:       72.0 in Accession #:    [id number]       Weight:       190.0 lb Date of Birth:  [birth year]        BSA:          2.085 m Patient Age:    74 years         BP:           109/79 mmHg Patient Gender: M                HR:           118 bpm. Exam Location:  Inpatient Procedure: 2D Echo, Cardiac Doppler and Color Doppler Indications:    Chest pain                 CHF-Acute systolic  History:        Patient has prior history of Echocardiogram examinations, most                 recent 07/20/2019. CHF, CAD, Mitral Valve Disease,                 Arrythmias:Atrial Fibrillation and PVC; Risk                 Factors:Hypertension and Diabetes. S/p mitral valve replacement.                 PVD.                  Mitral Valve: bioprosthetic valve valve is present in the mitral                 position.  Sonographer:    Clayton Lefort RDCS (AE) Referring Phys: [id number] [place]  1. There is substantial LV setal-lateral dyssynchrony. Left ventricular ejection fraction, by estimation, is 25 to 30%. The left ventricle has severely decreased function. The left ventricle demonstrates global hypokinesis. Left ventricular diastolic function could not be evaluated.  2. Right ventricular systolic function is severely reduced. The right ventricular size is severely enlarged. There is moderately elevated pulmonary artery  systolic pressure.  3. Left atrial size was moderately dilated.  4. Right atrial size was severely dilated.  5. The mitral valve has been repaired/replaced. No evidence of mitral valve regurgitation. No evidence of mitral stenosis. The mean mitral valve  gradient is 6.1 mmHg with average heart rate of 121 bpm. There is a bioprosthetic valve present in the mitral position.  6. Tricuspid valve regurgitation is moderate.  7. The aortic valve is tricuspid. Aortic valve regurgitation is not visualized. No aortic stenosis is present.  8. There is borderline dilatation of the ascending aorta, measuring 37 mm.  9. The inferior vena cava is dilated in size with <50% respiratory variability, suggesting right atrial pressure of 15 mmHg. Comparison(s): A prior study was performed on 07/03/2019. Prior images reviewed side by side. The left ventricular function is worsened. There is substantial worsening of right ventricular systolic function. Mitral valve gradients are lower, probably due  to lower cardiac output. There are alternating QRS complex morphologies (V sensed/V paced versus ventricular bigeminy?) throughout the study. Severe LV dyssynchrony is present on both types of beats. FINDINGS  Left Ventricle: There is substantial LV setal-lateral dyssynchrony. Left ventricular ejection fraction, by estimation, is 25 to 30%. The left ventricle has severely decreased function. The left ventricle demonstrates global hypokinesis. The left ventricular internal cavity size was normal in size. There is borderline concentric left ventricular hypertrophy. Abnormal (paradoxical) septal motion consistent with post-operative status. Left ventricular diastolic function could not be evaluated due to mitral valve replacement. Left ventricular diastolic function could not be evaluated. Right Ventricle: The right ventricular size is severely enlarged. No increase in right ventricular wall thickness. Right ventricular systolic function is severely reduced. There is moderately elevated pulmonary artery systolic pressure. The tricuspid regurgitant velocity is 2.92 m/s, and with an assumed right atrial pressure of 15 mmHg, the estimated right ventricular systolic pressure is 08.6 mmHg. Left  Atrium: Left atrial size was moderately dilated. Right Atrium: Right atrial size was severely dilated. Pericardium: There is no evidence of pericardial effusion. Mitral Valve: The mitral valve has been repaired/replaced. No evidence of mitral valve regurgitation. There is a bioprosthetic valve present in the mitral position. No evidence of mitral valve stenosis. The mean mitral valve gradient is 6.1 mmHg with average heart rate of 121 bpm. Tricuspid Valve: The tricuspid valve is normal in structure. Tricuspid valve regurgitation is moderate. Aortic Valve: The aortic valve is tricuspid. Aortic valve regurgitation is not visualized. No aortic stenosis is present. Aortic valve mean gradient measures 1.7 mmHg. Aortic valve peak gradient measures 2.7 mmHg. Aortic valve area, by VTI measures 1.95 cm. Pulmonic Valve: The pulmonic valve was normal in structure. Pulmonic valve regurgitation is mild. Aorta: The aortic root is normal in size and structure. There is borderline dilatation of the ascending aorta, measuring 37 mm. Venous: The inferior vena cava is dilated in size with less than 50% respiratory variability, suggesting right atrial pressure of 15 mmHg. IAS/Shunts: No atrial level shunt detected by color flow Doppler. Additional Comments: A pacer wire is visualized.  LEFT VENTRICLE PLAX 2D LVIDd:         4.70 cm LVIDs:         4.10 cm LV PW:         1.10 cm LV IVS:        1.18 cm LVOT diam:     2.00 cm LV SV:         25 LV SV Index:   12 LVOT Area:  3.14 cm  RIGHT VENTRICLE            IVC RV Basal diam:  5.20 cm    IVC diam: 3.20 cm RV Mid diam:    4.30 cm RV S prime:     5.63 cm/s TAPSE (M-mode): 1.0 cm LEFT ATRIUM             Index       RIGHT ATRIUM           Index LA diam:        4.30 cm 2.06 cm/m  RA Area:     30.50 cm LA Vol (A2C):   96.7 ml 46.39 ml/m RA Volume:   110.00 ml 52.77 ml/m LA Vol (A4C):   62.5 ml 29.98 ml/m LA Biplane Vol: 78.2 ml 37.51 ml/m  AORTIC VALVE AV Area (Vmax):    2.03 cm AV  Area (Vmean):   1.86 cm AV Area (VTI):     1.95 cm AV Vmax:           81.70 cm/s AV Vmean:          59.067 cm/s AV VTI:            0.128 m AV Peak Grad:      2.7 mmHg AV Mean Grad:      1.7 mmHg LVOT Vmax:         52.70 cm/s LVOT Vmean:        35.000 cm/s LVOT VTI:          0.079 m LVOT/AV VTI ratio: 0.62  AORTA Ao Root diam: 3.56 cm Ao Asc diam:  3.70 cm MITRAL VALVE            TRICUSPID VALVE MV Area (PHT): 3.73 cm TR Peak grad:   34.1 mmHg MV Mean grad:  6.1 mmHg TR Vmax:        292.00 cm/s MV Decel Time: 203 msec                         SHUNTS                         Systemic VTI:  0.08 m                         Systemic Diam: 2.00 cm Dani Gobble Croitoru MD Electronically signed by Sanda Klein MD Signature Date/Time: 08/26/2020/4:59:49 PM    Final    CUP PACEART INCLINIC DEVICE CHECK  Result Date: 08/25/2020 ICD check in clinic. Normal device function. Unable to check threshold testing due to AS?VS rate of 133 . Impedance trends stable over time. AT/AF burden 0.3%, 7 AMS events with EGMs that show AT, longest 43 minutes. No ventricular arrhythmias. Histogram  distribution appropriate for patient and level of activity. No changes made this session. Device programmed at appropriate safety margins. Optivol elevated and patient symptomatic WC aware of findings.Device programmed to optimize intrinsic conduction. Estimated longevity 5 years, 3 months. Pt enrolled in remote follow-up and next remote 09/15/20. Patient education completed including shock plan. Auditory/vibratory alert demonstrated. ICD check in clinic. Normal device function. Unable to check threshold testing due to AS?VS rate of 133 . Impedance trends stable over time. AT/AF burden 0.3%, 7 AMS events with EGMs that show AT, longest 43 minutes. No ventricular arrhythmias. Histogram  distribution appropriate for patient and level of activity. No changes made this session. Device  programmed at appropriate safety margins. Optivol elevated and patient  symptomatic WC aware of findings.Device programmed to optimize intrinsic conduction. Estimated longevity 5 years, 3 months. Pt enrolled in remote follow-up and next remote 09/15/20. Patient education completed including shock plan. Auditory/vibratory alert demonstrated.   Cardiac Studies   ICD interrogation 08/25/20: ICD check in clinic. Normal device function. Unable to check threshold testing due to AS?VS rate of 133 . Impedance trends stable over time. AT/AF burden 0.3%, 7 AMS events with EGMs that show AT, longest 43 minutes. No ventricular arrhythmias. Histogram  distribution appropriate for patient and level of activity. No changes made this session. Device programmed at appropriate safety margins. Optivol elevated and patient symptomatic WC aware of findings.Device programmed to optimize intrinsic conduction.  Estimated longevity 5 years, 3 months.   Echo 07/03/19: 1. Left ventricular ejection fraction, by visual estimation, is 30 to  35%. The left ventricle has normal function. Normal left ventricular size.  There is no left ventricular hypertrophy.  2. Global right ventricle has normal systolic function.The right  ventricular size is moderately enlarged. No increase in right ventricular  wall thickness.  3. Left atrial size was normal.  4. Right atrial size was normal.  5. The mitral valve has been repaired/replaced. No evidence of mitral  valve regurgitation. No evidence of mitral stenosis.  6. Bioprosthetic mitral valve.   Mean gradient 30mmHg.  7. The tricuspid valve is normal in structure. Tricuspid valve  regurgitation is mild.  8. The aortic valve is normal in structure. Aortic valve regurgitation  was not visualized by color flow Doppler. Structurally normal aortic  valve, with no evidence of sclerosis or stenosis.  9. The pulmonic valve was normal in structure. Pulmonic valve  regurgitation is not visualized by color flow Doppler.  10. Moderately elevated  pulmonary artery systolic pressure.  11. A pacer wire is visualized in the RA and RV.  12. The inferior vena cava is normal in size with greater than 50%  respiratory variability, suggesting right atrial pressure of 3 mmHg.   Patient Profile     74 y.o. male with a history of endocarditis s/p mitral valve replacement in 2014, atrial fibrillation/flutter, HTN, DM2, and chronic systolic heart failure. He followed with EP for significant PVC burden (20%) with an EF of 30-35% and accelerated junctional rhythm - status post medtronic ICD.   Assessment & Plan    Acute on chronic systolic heart failure - EF 30-35% --> now 25-30% - optivol was elevated on last interrogation - BNP 2200 - diuresing on 80 mg IV lasix BID - he is overall net negative 1.6 L with 1.8 cc urine output yesterday - weight is 179 lbs from 190 lbs on admission (? Weight) - have restarted 12.5 mg toprol - unclear if rhythm is contributory  - continue diuresis today  Accelerated junctional rhythm Atrial fibrillation/flutter ICD in place - on mexiletine - appreciate input from EP --> will start amiodarone  Chronic anticoagulation - INR 1.9 - do not anticipate procedures - will restart coumadin - appreciate pharmacy help with this   Chest pain - widely patent coronaries in 2014 - hs troponin 25 --> 30 - will continue to follow symptoms, no chest pain now   Hx of endocarditis s/p mitral valve replacement (2014) - no MR on echo yesterday   AoCKD stage III - sCr 1.97 - daily BMP    For questions or updates, please contact [place] Please consult [email or web address] for contact info under  Signed, Pamela Maddy C. Oval Linsey, MD, [facility]  08/27/2020, 10:28 AM

## 2020-08-27 NOTE — Progress Notes (Signed)
   08/27/20 1333  Assess: MEWS Score  Temp 97.6 F (36.4 C)  BP (!) 97/52  Pulse Rate (!) 112  ECG Heart Rate (!) 108  Resp 13  Level of Consciousness Alert  SpO2 100 %  Assess: MEWS Score  MEWS Temp 0  MEWS Systolic 1  MEWS Pulse 1  MEWS RR 1  MEWS LOC 0  MEWS Score 3  MEWS Score Color Yellow  Treat  Pain Scale 0-10  Pain Score 0  Notify: Charge Nurse/RN  Name of Charge Nurse/RN Notified Londra RN  Date Charge Nurse/RN Notified 08/27/20  Time Charge Nurse/RN Notified 61  Notify: Provider  Provider Name/Title [facility]  Date Provider Notified 08/27/20  Time Provider Notified 1346  Notification Type Page  Notification Reason Other (Comment) (requested parameters for drip)  Response See new orders  Date of Provider Response 08/27/20  Time of Provider Response [id number]  Document  Patient Outcome Other (Comment) (monitoring)  Progress note created (see row info) Yes

## 2020-08-27 NOTE — Progress Notes (Signed)
  Pt back into tachycardia after ~15 minutes.   BP upper 80s-90s.   Continue IV amiodarone at 30 mg/hr. Only hold for systolic BP <61.   We will attempt pace termination again tomorrow am s/p amiodarone loading.   All above discussed with Dr. Georgiana Shore "[address] [place], [place]  08/27/2020 4:32 PM

## 2020-08-27 NOTE — Progress Notes (Signed)
ANTICOAGULATION CONSULT NOTE - Initial Consult  Pharmacy Consult for Coumadin Indication: atrial fibrillation  Allergies  Allergen Reactions  . Tuna [Fish Allergy] Nausea And Vomiting    Patient Measurements: Height: 6' (182.9 cm) Weight: 81.2 kg (179 lb 0.2 oz) IBW/kg (Calculated) : 77.6  Vital Signs: Temp: 97.6 F (36.4 C) (12/08 1137) Temp Source: Oral (12/08 1137) BP: 108/68 (12/08 1137) Pulse Rate: 57 (12/08 1137)  Labs: Recent Labs    08/25/20 1121 08/25/20 1419 08/25/20 2112 08/26/20 0621 08/27/20 0108  HGB 11.9* 11.5*  --   --  11.0*  HCT 37.9 38.0*  --   --  33.8*  PLT 245 328  --   --  119*  LABPROT  --   --   --  21.0* 21.4*  INR  --   --   --  1.9* 1.9*  CREATININE 1.76* 1.78*  --   --  1.91*  TROPONINIHS  --  25* 30*  --   --     Estimated Creatinine Clearance: 36.1 mL/min (A) (by C-G formula based on SCr of 1.91 mg/dL (H)).   Medical History: Past Medical History:  Diagnosis Date  . Atrial fibrillation ([place])   . 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 Diabetes mellitus without complication ([place])   . Endocarditis   . Hypertension   . Peripheral vascular disease ([place])   . PVC's (premature ventricular contractions)   . SVT (supraventricular tachycardia)  long RP     Assessment: Anticoag: Afib/flutter, MVR 2014. CHADS2VASC 6. INR 1.9. Noted Plts 328>119 today?? Resume Coumadin 12/8. Monitor for drug interactions with amiodarone - Coumadin PTA 5mg  daily. Admit INR 1.9.  Goal of Therapy:  INR 2-3 Monitor platelets by anticoagulation protocol: Yes   Plan:  Coumadin 7.5mg  po x 1 tonight. Daily INR   Navia Lindahl S. Alford Highland, PharmD, BCPS Clinical Staff Pharmacist [email or web address] Alford Highland, [employer] 08/27/2020,12:30 PM

## 2020-08-27 NOTE — Discharge Instructions (Addendum)
Information on my medicine - Coumadin   (Warfarin)  Why was Coumadin prescribed for you? Coumadin was prescribed for you because you have a blood clot or a medical condition that can cause an increased risk of forming blood clots. Blood clots can cause serious health problems by blocking the flow of blood to the heart, lung, or brain. Coumadin can prevent harmful blood clots from forming. As a reminder your indication for Coumadin is:   Stroke Prevention Because Of Atrial Fibrillation  What test will check on my response to Coumadin? While on Coumadin (warfarin) you will need to have an INR test regularly to ensure that your dose is keeping you in the desired range. The INR (international normalized ratio) number is calculated from the result of the laboratory test called prothrombin time (PT).  If an INR APPOINTMENT HAS NOT ALREADY BEEN MADE FOR YOU please schedule an appointment to have this lab work done by your health care provider within 7 days. Your INR goal is usually a number between:  2 to 3 or your provider may give you a more narrow range like 2-2.5.  Ask your health care provider during an office visit what your goal INR is.  What  do you need to  know  About  COUMADIN? Take Coumadin (warfarin) exactly as prescribed by your healthcare provider about the same time each day.  DO NOT stop taking without talking to the doctor who prescribed the medication.  Stopping without other blood clot prevention medication to take the place of Coumadin may increase your risk of developing a new clot or stroke.  Get refills before you run out.  What do you do if you miss a dose? If you miss a dose, take it as soon as you remember on the same day then continue your regularly scheduled regimen the next day.  Do not take two doses of Coumadin at the same time.  Important Safety Information A possible side effect of Coumadin (Warfarin) is an increased risk of bleeding. You should call your healthcare  provider right away if you experience any of the following: ? Bleeding from an injury or your nose that does not stop. ? Unusual colored urine (red or dark brown) or unusual colored stools (red or black). ? Unusual bruising for unknown reasons. ? A serious fall or if you hit your head (even if there is no bleeding).  Some foods or medicines interact with Coumadin (warfarin) and might alter your response to warfarin. To help avoid this: ? Eat a balanced diet, maintaining a consistent amount of Vitamin K. ? Notify your provider about major diet changes you plan to make. ? Avoid alcohol or limit your intake to 1 drink for women and 2 drinks for men per day. (1 drink is 5 oz. wine, 12 oz. beer, or 1.5 oz. liquor.)  Make sure that ANY health care provider who prescribes medication for you knows that you are taking Coumadin (warfarin).  Also make sure the healthcare provider who is monitoring your Coumadin knows when you have started a new medication including herbals and non-prescription products.  Coumadin (Warfarin)  Major Drug Interactions  Increased Warfarin Effect Decreased Warfarin Effect  Alcohol (large quantities) Antibiotics (esp. Septra/Bactrim, Flagyl, Cipro) Amiodarone (Cordarone) Aspirin (ASA) Cimetidine (Tagamet) Megestrol (Megace) NSAIDs (ibuprofen, naproxen, etc.) Piroxicam (Feldene) Propafenone (Rythmol SR) Propranolol (Inderal) Isoniazid (INH) Posaconazole (Noxafil) Barbiturates (Phenobarbital) Carbamazepine (Tegretol) Chlordiazepoxide (Librium) Cholestyramine (Questran) Griseofulvin Oral Contraceptives Rifampin Sucralfate (Carafate) Vitamin K   Coumadin (Warfarin) Major Herbal  Interactions  Increased Warfarin Effect Decreased Warfarin Effect  Garlic Ginseng Ginkgo biloba Coenzyme Q10 Green tea St. John's wort    Coumadin (Warfarin) FOOD Interactions  Eat a consistent number of servings per week of foods HIGH in Vitamin K (1 serving =  cup)  Collards  (cooked, or boiled & drained) Kale (cooked, or boiled & drained) Mustard greens (cooked, or boiled & drained) Parsley *serving size only =  cup Spinach (cooked, or boiled & drained) Swiss chard (cooked, or boiled & drained) Turnip greens (cooked, or boiled & drained)  Eat a consistent number of servings per week of foods MEDIUM-HIGH in Vitamin K (1 serving = 1 cup)  Asparagus (cooked, or boiled & drained) Broccoli (cooked, boiled & drained, or raw & chopped) Brussel sprouts (cooked, or boiled & drained) *serving size only =  cup Lettuce, raw (green leaf, endive, romaine) Spinach, raw Turnip greens, raw & chopped   These websites have more information on Coumadin (warfarin):  FailFactory.se; VeganReport.com.au;   Cardiac Ablation, Care After  This sheet gives you information about how to care for yourself after your procedure. Your health care provider may also give you more specific instructions. If you have problems or questions, contact your health care provider. What can I expect after the procedure? After the procedure, it is common to have:  Bruising around your puncture site.  Tenderness around your puncture site.  Skipped heartbeats.  Tiredness (fatigue).  Follow these instructions at home: Puncture site care   Follow instructions from your health care provider about how to take care of your puncture site. Make sure you: ? If present, leave stitches (sutures), skin glue, or adhesive strips in place. These skin closures may need to stay in place for up to 2 weeks. If adhesive strip edges start to loosen and curl up, you may trim the loose edges. Do not remove adhesive strips completely unless your health care provider tells you to do that. ? If a large square bandage is present, this may be removed 24 hours after surgery.   Check your puncture site every day for signs of infection. Check for: ? Redness, swelling, or pain. ? Fluid or blood. If your  puncture site starts to bleed, lie down on your back, apply firm pressure to the area, and contact your health care provider. ? Warmth. ? Pus or a bad smell. Driving  Do not drive for at least 4 days after your procedure or however long your health care provider recommends. (Do not resume driving if you have previously been instructed not to drive for other health reasons.)  Do not drive or use heavy machinery while taking prescription pain medicine. Activity  Avoid activities that take a lot of effort for at least 7 days after your procedure.  Do not lift anything that is heavier than 5 lb (4.5 kg) for one week.   No sexual activity for 1 week.   Return to your normal activities as told by your health care provider. Ask your health care provider what activities are safe for you. General instructions  Take over-the-counter and prescription medicines only as told by your health care provider.  Do not use any products that contain nicotine or tobacco, such as cigarettes and e-cigarettes. If you need help quitting, ask your health care provider.  You may shower after 24 hours, but Do not take baths, swim, or use a hot tub for 1 week.   Do not drink alcohol for 24 hours after your procedure.  Keep all follow-up visits as told by your health care provider. This is important. Contact a health care provider if:  You have redness, mild swelling, or pain around your puncture site.  You have fluid or blood coming from your puncture site that stops after applying firm pressure to the area.  Your puncture site feels warm to the touch.  You have pus or a bad smell coming from your puncture site.  You have a fever.  You have chest pain or discomfort that spreads to your neck, jaw, or arm.  You are sweating a lot.  You feel nauseous.  You have a fast or irregular heartbeat.  You have shortness of breath.  You are dizzy or light-headed and feel the need to lie down.  You have pain  or numbness in the arm or leg closest to your puncture site. Get help right away if:  Your puncture site suddenly swells.  Your puncture site is bleeding and the bleeding does not stop after applying firm pressure to the area. These symptoms may represent a serious problem that is an emergency. Do not wait to see if the symptoms will go away. Get medical help right away. Call your local emergency services (911 in the [place] Do not drive yourself to the hospital. Summary  After the procedure, it is normal to have bruising and tenderness at the puncture site in your groin, neck, or forearm.  Check your puncture site every day for signs of infection.  Get help right away if your puncture site is bleeding and the bleeding does not stop after applying firm pressure to the area. This is a medical emergency. This information is not intended to replace advice given to you by your health care provider. Make sure you discuss any questions you have with your health care provider.

## 2020-08-27 NOTE — Progress Notes (Signed)
   08/27/20 1953  Assess: MEWS Score  Temp (!) 97.5 F (36.4 C)  BP (!) 92/59  Pulse Rate (!) 111  Resp 18  SpO2 100 %  O2 Device Room Air  Assess: MEWS Score  MEWS Temp 0  MEWS Systolic 1  MEWS Pulse 2  MEWS RR 0  MEWS LOC 0  MEWS Score 3  MEWS Score Color Yellow  Assess: if the MEWS score is Yellow or Red  Were vital signs taken at a resting state? Yes  Focused Assessment No change from prior assessment  Early Detection of Sepsis Score *See Row Information* Low  MEWS guidelines implemented *See Row Information* No, previously yellow, continue vital signs every 4 hours  Treat  Pain Score 0  Notify: Charge Nurse/RN  Name of Charge Nurse/RN Notified Jequetta CN  Date Charge Nurse/RN Notified 08/27/20  Time Charge Nurse/RN Notified 2016  Document  Patient Outcome Other (Comment) (on amiodarone Drip. HR at 110s)  Progress note created (see row info) Yes

## 2020-08-27 NOTE — Progress Notes (Signed)
MDs came to bedside and paced patients heart.     08/27/20 1535  Assess: MEWS Score  BP (!) 80/61  ECG Heart Rate (!) 109  Resp 20  Level of Consciousness Alert  Assess: MEWS Score  MEWS Temp 0  MEWS Systolic 2  MEWS Pulse 1  MEWS RR 0  MEWS LOC 0  MEWS Score 3  MEWS Score Color Yellow  Treat  Pain Scale 0-10  Pain Score 0  Notify: Charge Nurse/RN  Name of Charge Nurse/RN Notified Londra  Date Charge Nurse/RN Notified 08/27/20  Time Charge Nurse/RN Notified [id number]  Notify: Provider  Provider Name/Title [facility]  Date Provider Notified 08/27/20  Time Provider Notified 1537  Notification Type Page  Date of Provider Response 08/27/20  Time of Provider Response 1538  Document  Patient Outcome Other (Comment) (continuing to monitor)  Progress note created (see row info) Yes

## 2020-08-27 NOTE — Consult Note (Addendum)
ELECTROPHYSIOLOGY CONSULT NOTE    Patient ID: Ryan [name] MRN: [id number], DOB/AGE: 11/19/1945 74 y.o.  Admit date: 08/25/2020 Date of Consult: 08/27/2020  Primary Physician: [name], MD Primary Cardiologist: [name], MD  Electrophysiologist: Dr. [name]  Referring Provider: Dr. Oval Linsey  Patient Profile: Ryan [name] is a 74 y.o. male with a history of endocarditis s/p mitral valve replacement in 2014, atrial fibrillation/flutter, HTN, DM2, and chronic systolic heart failure. He followed with EP for significant PVC burden (20%) with an EF of 30-35% and accelerated junctional rhythm - status post medtronic ICD.   HPI:  Ryan [name] is a 74 y.o. male with medical history as above.   Seen in EP clinic 08/25/2020. He had significant DOE with minimal exertion, PND, and orthopnea. Noted to be in SVT vs junctional tachycardia in 130s. Sent to Ed.   Pt responded well to IV diuresis, and started on metoprolol. He was feeling better and converted to NSR early this am, but after a few hours he went back into his SVT. Amiodarone started.   He had soft pressures on this and EP asked to see.   MDT programmer taken to room and pt paced out of SVT with PES and cycle length of 450 ms (tachycardia cycle length 540 ms).  Currently he is feeling well at rest.    Past Medical History:  Diagnosis Date  . Atrial fibrillation ([place])   . 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 Diabetes mellitus without complication ([place])   . Endocarditis   . Hypertension   . Peripheral vascular disease ([place])   . PVC's (premature ventricular contractions)   . SVT (supraventricular tachycardia)  long RP      Surgical History:  Past Surgical History:  Procedure Laterality Date  . ABDOMINAL AORTAGRAM N/A 10/03/2012   Procedure: ABDOMINAL Maxcine Ham;  Surgeon: [name], MD;  Location: [facility] CATH LAB;  Service: Cardiovascular;  Laterality: N/A;  . CORONARY ANGIOGRAM  09/21/2012   Procedure: CORONARY  ANGIOGRAM;  Surgeon: [name], MD;  Location: [facility] CATH LAB;  Service: Cardiovascular;;  . EP IMPLANTABLE DEVICE N/A 06/11/2016   Procedure: ICD Implant;  Surgeon: Yotam Rhine Meredith Leeds, MD;  Location: [facility] CV LAB;  Service: Cardiovascular;  Laterality: N/A;  . EXTREMITY WIRE/PIN REMOVAL  09/14/2012   Procedure: REMOVAL K-WIRE/PIN EXTREMITY;  Surgeon: [name], MD;  Location: [facility];  Service: Orthopedics;  Laterality: Right;  Right Foot  . I & D EXTREMITY  09/14/2012   Procedure: IRRIGATION AND DEBRIDEMENT EXTREMITY;  Surgeon: Tennis Must, MD;  Location: [facility];  Service: Orthopedics;  Laterality: Right;  . INTRAOPERATIVE TRANSESOPHAGEAL ECHOCARDIOGRAM  09/26/2012   Procedure: INTRAOPERATIVE TRANSESOPHAGEAL ECHOCARDIOGRAM;  Surgeon: [name], MD;  Location: [facility] OR;  Service: Open Heart Surgery;  Laterality: N/A;  . MITRAL VALVE REPLACEMENT  09/26/2012   Procedure: MITRAL VALVE (MV) REPLACEMENT;  Surgeon: [name], MD;  Location: [place] OR;  Service: Open Heart Surgery;  Laterality: N/A;  . RIGHT HEART CATHETERIZATION  09/21/2012   Procedure: RIGHT HEART CATH;  Surgeon: [name], MD;  Location: [facility] CATH LAB;  Service: Cardiovascular;;  . TEE WITHOUT CARDIOVERSION  09/18/2012   Procedure: TRANSESOPHAGEAL ECHOCARDIOGRAM (TEE);  Surgeon: [name], MD;  [facility];  Service: Cardiovascular;  Laterality: N/A;     Medications Prior to Admission  Medication Sig Dispense Refill Last Dose  . acetaminophen (TYLENOL) 325 MG tablet Take 650 mg by mouth  every 6 (six) hours as needed for mild pain or headache.   Past Week at Unknown time  . atorvastatin (LIPITOR) 40 MG tablet Take 1 tablet (40 mg total) by mouth daily at 6 PM. 30 tablet 5 08/25/2020 at Unknown time  . HYDROcodone-acetaminophen (NORCO/VICODIN) 5-325 MG per tablet Take 1 tablet by mouth 2 (two) times daily as needed for moderate pain.    08/25/2020 at Unknown time  . mexiletine (MEXITIL) 250 MG capsule  TAKE 1 CAPSULE BY MOUTH 2 TIMES DAILY. (Patient taking differently: Take 250 mg by mouth 2 (two) times daily. ) 180 capsule 3 08/25/2020 at Unknown time  . Multiple Vitamins-Minerals (CENTRUM SILVER PO) Take 1 tablet by mouth 3 (three) times a week.   08/25/2020 at Unknown time  . pantoprazole (PROTONIX) 40 MG tablet Take 40 mg by mouth daily.    08/25/2020 at Unknown time  . potassium chloride SA (K-DUR,KLOR-CON) 20 MEQ tablet Take 20 mEq by mouth daily.    08/25/2020 at Unknown time  . sacubitril-valsartan (ENTRESTO) 24-26 MG Take 1 tablet by mouth 2 (two) times daily. 60 tablet 3 08/24/2020 at Unknown time  . tamsulosin (FLOMAX) 0.4 MG CAPS capsule Take 0.4 mg by mouth daily.    08/25/2020 at Unknown time  . triamcinolone cream (KENALOG) 0.5 % Apply 1 application topically daily as needed (skin rash).    Past Week at Unknown time  . warfarin (COUMADIN) 5 MG tablet Take 5 mg by mouth daily.    08/24/2020    Inpatient Medications:  . atorvastatin  40 mg Oral q1800  . furosemide  80 mg Intravenous BID  . metoprolol tartrate  12.5 mg Oral BID  . mexiletine  250 mg Oral Q12H  . multivitamin with minerals   Oral Once per day on Mon Wed Fri  . pantoprazole  40 mg Oral Daily  . tamsulosin  0.4 mg Oral Daily  . warfarin  7.5 mg Oral ONCE-1600  . Warfarin - Pharmacist Dosing Inpatient   Does not apply q1600    Allergies:  Allergies  Allergen Reactions  . Geralyn Flash [Fish Allergy] Nausea And Vomiting    Social History   Socioeconomic History  . Marital status: Legally Separated    Spouse name: Not on file  . Number of children: 3  . Years of education: Not on file  . Highest education level: Not on file  Occupational History  . Not on file  Tobacco Use  . Smoking status: Never Smoker  . Smokeless tobacco: Never Used  Vaping Use  . Vaping Use: Never used  Substance and Sexual Activity  . Alcohol use: No    Alcohol/week: 0.0 standard drinks  . Drug use: No  . Sexual activity: Not on file   Other Topics Concern  . Not on file  Social History Narrative   Daughter lives with him.  Retired Advertising account planner   Social Determinants of Radio broadcast assistant Strain:   . Difficulty of Paying Living Expenses: Not on file  Food Insecurity:   . Worried About Charity fundraiser in the Last Year: Not on file  . Ran Out of Food in the Last Year: Not on file  Transportation Needs:   . Lack of Transportation (Medical): Not on file  . Lack of Transportation (Non-Medical): Not on file  Physical Activity:   . Days of Exercise per Week: Not on file  . Minutes of Exercise per Session: Not on file  Stress:   .  Feeling of Stress : Not on file  Social Connections:   . Frequency of Communication with Friends and Family: Not on file  . Frequency of Social Gatherings with Friends and Family: Not on file  . Attends Religious Services: Not on file  . Active Member of Clubs or Organizations: Not on file  . Attends Archivist Meetings: Not on file  . Marital Status: Not on file  Intimate Partner Violence:   . Fear of Current or Ex-Partner: Not on file  . Emotionally Abused: Not on file  . Physically Abused: Not on file  . Sexually Abused: Not on file     Family History  Problem Relation Age of Onset  . Hypertension Mother   . Hypertension Father      Review of Systems: All other systems reviewed and are otherwise negative except as noted above.  Physical Exam: Vitals:   08/27/20 1400 08/27/20 1500 08/27/20 1535 08/27/20 1546  BP: (!) 87/67 (!) 88/61 (!) 80/61 92/63  Pulse: 95   74  Resp: 18  20 20   Temp:    97.6 F (36.4 C)  TempSrc:      SpO2: 100%   100%  Weight:      Height:        GEN- The patient is well appearing, alert and oriented x 3 today.   HEENT: normocephalic, atraumatic; sclera clear, conjunctiva pink; hearing intact; oropharynx clear; neck supple Lungs- Clear to ausculation bilaterally, normal work of breathing.  No wheezes, rales,  rhonchi Heart- Regular rate and rhythm, no murmurs, rubs or gallops GI- soft, non-tender, non-distended, bowel sounds present Extremities- no clubbing or cyanosis. 1+ ankle edema; DP/PT/radial pulses 2+ bilaterally MS- no significant deformity or atrophy Skin- warm and dry, no rash or lesion Psych- euthymic mood, full affect Neuro- strength and sensation are intact  Labs:   Lab Results  Component Value Date   WBC 5.0 08/27/2020   HGB 11.0 (L) 08/27/2020   HCT 33.8 (L) 08/27/2020   MCV 72.8 (L) 08/27/2020   PLT 119 (L) 08/27/2020    Recent Labs  Lab 08/27/20 0108  NA 140  K 4.2  CL 110  CO2 20*  BUN 38*  CREATININE 1.91*  CALCIUM 8.9  GLUCOSE 97      Radiology/Studies: DG Chest 2 View  Result Date: 08/25/2020 CLINICAL DATA:  Chest pain. EXAM: CHEST - 2 VIEW COMPARISON:  June 12, 2016. FINDINGS: Stable cardiomegaly. Status post cardiac valve repair. Left-sided pacemaker is unchanged in position. No pneumothorax is noted. Minimal bibasilar subsegmental atelectasis is noted with small pleural effusions. Bony thorax is unremarkable. IMPRESSION: Minimal bibasilar subsegmental atelectasis with small pleural effusions. Electronically Signed   By: Marijo Conception M.D.   On: 08/25/2020 14:26   ECHOCARDIOGRAM COMPLETE  Result Date: 08/26/2020    ECHOCARDIOGRAM REPORT   Patient Name:   Ryan [name] Date of Exam: 08/26/2020 Medical Rec #:  [id number]        Height:       72.0 in Accession #:    [id number]       Weight:       190.0 lb Date of Birth:  [birth year]        BSA:          2.085 m Patient Age:    57 years         BP:           109/79 mmHg Patient Gender: M  HR:           118 bpm. Exam Location:  Inpatient Procedure: 2D Echo, Cardiac Doppler and Color Doppler Indications:    Chest pain                 CHF-Acute systolic  History:        Patient has prior history of Echocardiogram examinations, most                 recent 07/20/2019. CHF, CAD, Mitral Valve  Disease,                 Arrythmias:Atrial Fibrillation and PVC; Risk                 Factors:Hypertension and Diabetes. S/p mitral valve replacement.                 PVD.                  Mitral Valve: bioprosthetic valve valve is present in the mitral                 position.  Sonographer:    Clayton Lefort RDCS (AE) Referring Phys: [id number] [place]  1. There is substantial LV setal-lateral dyssynchrony. Left ventricular ejection fraction, by estimation, is 25 to 30%. The left ventricle has severely decreased function. The left ventricle demonstrates global hypokinesis. Left ventricular diastolic function could not be evaluated.  2. Right ventricular systolic function is severely reduced. The right ventricular size is severely enlarged. There is moderately elevated pulmonary artery systolic pressure.  3. Left atrial size was moderately dilated.  4. Right atrial size was severely dilated.  5. The mitral valve has been repaired/replaced. No evidence of mitral valve regurgitation. No evidence of mitral stenosis. The mean mitral valve gradient is 6.1 mmHg with average heart rate of 121 bpm. There is a bioprosthetic valve present in the mitral position.  6. Tricuspid valve regurgitation is moderate.  7. The aortic valve is tricuspid. Aortic valve regurgitation is not visualized. No aortic stenosis is present.  8. There is borderline dilatation of the ascending aorta, measuring 37 mm.  9. The inferior vena cava is dilated in size with <50% respiratory variability, suggesting right atrial pressure of 15 mmHg. Comparison(s): A prior study was performed on 07/03/2019. Prior images reviewed side by side. The left ventricular function is worsened. There is substantial worsening of right ventricular systolic function. Mitral valve gradients are lower, probably due  to lower cardiac output. There are alternating QRS complex morphologies (V sensed/V paced versus ventricular bigeminy?) throughout the study.  Severe LV dyssynchrony is present on both types of beats. FINDINGS  Left Ventricle: There is substantial LV setal-lateral dyssynchrony. Left ventricular ejection fraction, by estimation, is 25 to 30%. The left ventricle has severely decreased function. The left ventricle demonstrates global hypokinesis. The left ventricular internal cavity size was normal in size. There is borderline concentric left ventricular hypertrophy. Abnormal (paradoxical) septal motion consistent with post-operative status. Left ventricular diastolic function could not be evaluated due to mitral valve replacement. Left ventricular diastolic function could not be evaluated. Right Ventricle: The right ventricular size is severely enlarged. No increase in right ventricular wall thickness. Right ventricular systolic function is severely reduced. There is moderately elevated pulmonary artery systolic pressure. The tricuspid regurgitant velocity is 2.92 m/s, and with an assumed right atrial pressure of 15 mmHg, the estimated right ventricular systolic pressure is 94.1 mmHg. Left Atrium: Left atrial  size was moderately dilated. Right Atrium: Right atrial size was severely dilated. Pericardium: There is no evidence of pericardial effusion. Mitral Valve: The mitral valve has been repaired/replaced. No evidence of mitral valve regurgitation. There is a bioprosthetic valve present in the mitral position. No evidence of mitral valve stenosis. The mean mitral valve gradient is 6.1 mmHg with average heart rate of 121 bpm. Tricuspid Valve: The tricuspid valve is normal in structure. Tricuspid valve regurgitation is moderate. Aortic Valve: The aortic valve is tricuspid. Aortic valve regurgitation is not visualized. No aortic stenosis is present. Aortic valve mean gradient measures 1.7 mmHg. Aortic valve peak gradient measures 2.7 mmHg. Aortic valve area, by VTI measures 1.95 cm. Pulmonic Valve: The pulmonic valve was normal in structure. Pulmonic valve  regurgitation is mild. Aorta: The aortic root is normal in size and structure. There is borderline dilatation of the ascending aorta, measuring 37 mm. Venous: The inferior vena cava is dilated in size with less than 50% respiratory variability, suggesting right atrial pressure of 15 mmHg. IAS/Shunts: No atrial level shunt detected by color flow Doppler. Additional Comments: A pacer wire is visualized.  LEFT VENTRICLE PLAX 2D LVIDd:         4.70 cm LVIDs:         4.10 cm LV PW:         1.10 cm LV IVS:        1.18 cm LVOT diam:     2.00 cm LV SV:         25 LV SV Index:   12 LVOT Area:     3.14 cm  RIGHT VENTRICLE            IVC RV Basal diam:  5.20 cm    IVC diam: 3.20 cm RV Mid diam:    4.30 cm RV S prime:     5.63 cm/s TAPSE (M-mode): 1.0 cm LEFT ATRIUM             Index       RIGHT ATRIUM           Index LA diam:        4.30 cm 2.06 cm/m  RA Area:     30.50 cm LA Vol (A2C):   96.7 ml 46.39 ml/m RA Volume:   110.00 ml 52.77 ml/m LA Vol (A4C):   62.5 ml 29.98 ml/m LA Biplane Vol: 78.2 ml 37.51 ml/m  AORTIC VALVE AV Area (Vmax):    2.03 cm AV Area (Vmean):   1.86 cm AV Area (VTI):     1.95 cm AV Vmax:           81.70 cm/s AV Vmean:          59.067 cm/s AV VTI:            0.128 m AV Peak Grad:      2.7 mmHg AV Mean Grad:      1.7 mmHg LVOT Vmax:         52.70 cm/s LVOT Vmean:        35.000 cm/s LVOT VTI:          0.079 m LVOT/AV VTI ratio: 0.62  AORTA Ao Root diam: 3.56 cm Ao Asc diam:  3.70 cm MITRAL VALVE            TRICUSPID VALVE MV Area (PHT): 3.73 cm TR Peak grad:   34.1 mmHg MV Mean grad:  6.1 mmHg TR Vmax:        292.00  cm/s MV Decel Time: 203 msec                         SHUNTS                         Systemic VTI:  0.08 m                         Systemic Diam: 2.00 cm Dani Gobble Croitoru MD Electronically signed by Sanda Klein MD Signature Date/Time: 08/26/2020/4:59:49 PM    Final    CUP PACEART INCLINIC DEVICE CHECK  Result Date: 08/25/2020 ICD check in clinic. Normal device function. Unable to  check threshold testing due to AS?VS rate of 133 . Impedance trends stable over time. AT/AF burden 0.3%, 7 AMS events with EGMs that show AT, longest 43 minutes. No ventricular arrhythmias. Histogram  distribution appropriate for patient and level of activity. No changes made this session. Device programmed at appropriate safety margins. Optivol elevated and patient symptomatic WC aware of findings.Device programmed to optimize intrinsic conduction. Estimated longevity 5 years, 3 months. Pt enrolled in remote follow-up and next remote 09/15/20. Patient education completed including shock plan. Auditory/vibratory alert demonstrated. ICD check in clinic. Normal device function. Unable to check threshold testing due to AS?VS rate of 133 . Impedance trends stable over time. AT/AF burden 0.3%, 7 AMS events with EGMs that show AT, longest 43 minutes. No ventricular arrhythmias. Histogram  distribution appropriate for patient and level of activity. No changes made this session. Device programmed at appropriate safety margins. Optivol elevated and patient symptomatic WC aware of findings.Device programmed to optimize intrinsic conduction. Estimated longevity 5 years, 3 months. Pt enrolled in remote follow-up and next remote 09/15/20. Patient education completed including shock plan. Auditory/vibratory alert demonstrated.    EKG: shows junctional tachycardia versus SVT in 130s  (personally reviewed)  TELEMETRY: NSR -> SVT in 110-120s. He initially converted to NSR ~ 0715 this am, but has since gone back out of rhythm (personally reviewed)  DEVICE HISTORY: MDT Dual chamber PPM 2017  Assessment/Plan: 1.  SVT vs rapid junctional rhythm Able to be paced out today at bedside with Dr. [name] Resume amiodarone gtt at 30mg /hr.  Continue BB.   2. Acute on chronic systolic CHF Appreciate gen cards management.  Responding well to diuresis thus far.   3. Paroxysmal atrial fibrillation On coumadin for CHA2DS2VASC of  at least 3.    EP Michele Kerlin follow along with you  For questions or updates, please contact [place] Please consult [email or web address] for contact info under Cardiology/STEMI.  Signed, Shirley Friar, PA-C  08/27/2020 3:52 PM   I have seen and examined this patient with [name].  Agree with above, note added to reflect my findings.  On exam, RRR, no murmurs.  Patient into the hospital with acute on chronic systolic heart failure.  He has been diuresing well.  When he was admitted to the hospital, he was noted to have a rapid heart rate at approximately 130 beats a minute.  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  Attempts were made to pace him out of his SVT which did return him to sinus rhythm, though he did go back into SVT.  He is currently on amiodarone which we Daylyn Azbill continue throughout the night tonight.  It is certainly possible that his amiodarone Liese Dizdarevic convert him back to normal rhythm.  He  also has a history of PVCs.  Amiodarone should be sufficient for both of these arrhythmias.  Meril Dray M. Buck Mcaffee MD 08/27/2020 4:33 PM

## 2020-08-28 ENCOUNTER — Encounter (HOSPITAL_COMMUNITY): Payer: Self-pay | Admitting: Cardiovascular Disease

## 2020-08-28 DIAGNOSIS — I509 Heart failure, unspecified: Secondary | ICD-10-CM

## 2020-08-28 LAB — BASIC METABOLIC PANEL
Anion gap: 10 (ref 5–15)
BUN: 42 mg/dL — ABNORMAL HIGH (ref 8–23)
CO2: 22 mmol/L (ref 22–32)
Calcium: 8.6 mg/dL — ABNORMAL LOW (ref 8.9–10.3)
Chloride: 108 mmol/L (ref 98–111)
Creatinine, Ser: 2.11 mg/dL — ABNORMAL HIGH (ref 0.61–1.24)
GFR, Estimated: 32 mL/min — ABNORMAL LOW (ref >60)
Glucose, Bld: 106 mg/dL — ABNORMAL HIGH (ref 70–99)
Potassium: 3.8 mmol/L (ref 3.5–5.1)
Sodium: 140 mmol/L (ref 135–145)

## 2020-08-28 LAB — PROTIME-INR
INR: 1.7 — ABNORMAL HIGH (ref 0.8–1.2)
Prothrombin Time: 19.2 seconds — ABNORMAL HIGH (ref 11.4–15.2)

## 2020-08-28 LAB — MAGNESIUM: Magnesium: 2.1 mg/dL (ref 1.7–2.4)

## 2020-08-28 LAB — GLUCOSE, CAPILLARY: Glucose-Capillary: 117 mg/dL — ABNORMAL HIGH (ref 70–99)

## 2020-08-28 MED ORDER — POLYETHYLENE GLYCOL 3350 17 G PO PACK
17.0000 g | PACK | Freq: Every day | ORAL | Status: DC
  Administered 2020-08-28 – 2020-09-06 (×8): 17 g via ORAL
  Filled 2020-08-28 (×8): qty 1

## 2020-08-28 MED ORDER — WARFARIN SODIUM 7.5 MG PO TABS
7.5000 mg | ORAL_TABLET | Freq: Once | ORAL | Status: AC
  Administered 2020-08-28: 7.5 mg via ORAL
  Filled 2020-08-28: qty 1

## 2020-08-28 MED ORDER — AMIODARONE HCL 200 MG PO TABS
400.0000 mg | ORAL_TABLET | Freq: Two times a day (BID) | ORAL | Status: DC
  Administered 2020-08-28: 400 mg via ORAL
  Filled 2020-08-28: qty 2

## 2020-08-28 MED ORDER — SENNOSIDES-DOCUSATE SODIUM 8.6-50 MG PO TABS
1.0000 | ORAL_TABLET | Freq: Two times a day (BID) | ORAL | Status: DC | PRN
  Administered 2020-08-28 – 2020-09-04 (×2): 1 via ORAL
  Filled 2020-08-28 (×2): qty 1

## 2020-08-28 MED ORDER — AMIODARONE HCL IN DEXTROSE 360-4.14 MG/200ML-% IV SOLN
30.0000 mg/h | INTRAVENOUS | Status: DC
  Administered 2020-08-28: 30 mg/h via INTRAVENOUS

## 2020-08-28 MED ORDER — SODIUM CHLORIDE 0.9 % IV SOLN
500.0000 mL | Freq: Once | INTRAVENOUS | Status: AC
  Administered 2020-08-28: 500 mL via INTRAVENOUS

## 2020-08-28 NOTE — Progress Notes (Signed)
Progress Note  Patient Name: Ryan [name] Date of Encounter: 08/28/2020  [facility] Cardiologist: [name], MD   Subjective   Nausea and abdominal discomfort.  He hasn't had a bowel movement since admission.   Inpatient Medications    Scheduled Meds: . atorvastatin  40 mg Oral q1800  . furosemide  80 mg Intravenous BID  . metoprolol tartrate  12.5 mg Oral BID  . mexiletine  250 mg Oral Q12H  . multivitamin with minerals   Oral Once per day on Mon Wed Fri  . pantoprazole  40 mg Oral Daily  . tamsulosin  0.4 mg Oral Daily  . warfarin  7.5 mg Oral ONCE-1600  . Warfarin - Pharmacist Dosing Inpatient   Does not apply q1600   Continuous Infusions: . amiodarone 30 mg/hr (08/28/20 0806)   PRN Meds: acetaminophen, ondansetron (ZOFRAN) IV   Vital Signs    Vitals:   08/27/20 2350 08/28/20 0400 08/28/20 0748 08/28/20 0800  BP: 93/63 100/75  112/71  Pulse:      Resp: 16 19  (!) 22  Temp: 98.7 F (37.1 C) 98.4 F (36.9 C) 98.3 F (36.8 C)   TempSrc: Oral Oral Oral   SpO2: 100%     Weight:  83 kg    Height:        Intake/Output Summary (Last 24 hours) at 08/28/2020 6237 Last data filed at 08/28/2020 0820 Gross per 24 hour  Intake 630 ml  Output 175 ml  Net 455 ml   Last 3 Weights 08/28/2020 08/27/2020 08/26/2020  Weight (lbs) 182 lb 15.7 oz 179 lb 0.2 oz 178 lb 5.6 oz  Weight (kg) 83 kg 81.2 kg 80.9 kg      Telemetry    Junctional tachycardia paced into sinus rhythm.  - Personally Reviewed  ECG    No new tracings - Personally Reviewed  Physical Exam   VS:  BP 112/71   Pulse (!) 111   Temp 98.3 F (36.8 C) (Oral)   Resp (!) 22   Ht 6' (1.829 m)   Wt 83 kg   SpO2 100%   BMI 24.82 kg/m  , BMI Body mass index is 24.82 kg/m. GENERAL:  Ill-appearing HEENT: Pupils equal round and reactive, fundi not visualized, oral mucosa unremarkable NECK:  No jugular venous distention, waveform within normal limits, carotid upstroke brisk and symmetric, no  bruit LUNGS: Clear to ausculation bilaterally. HEART: Tachycardic.  Irregularly irregular.  PMI not displaced or sustained,S1 and S2 within normal limits, no S3, no S4, no clicks, no rubs, II/VI systolic murmur at the left lower sternal border ABD:  Flat, positive bowel sounds normal in frequency in pitch, no bruits, no rebound, no guarding, no midline pulsatile mass, no hepatomegaly, no splenomegaly.  Mild diffuse TTP EXT:  2 plus pulses throughout, no edema, no cyanosis no clubbing SKIN:  No rashes no nodules NEURO:  Cranial nerves II through XII grossly intact, motor grossly intact throughout PSYCH:  Cognitively intact, oriented to person place and time   Labs    High Sensitivity Troponin:   Recent Labs  Lab 08/25/20 1419 08/25/20 2112  TROPONINIHS 25* 30*      Chemistry Recent Labs  Lab 08/25/20 1121 08/25/20 1419 08/27/20 0108  NA 144 143 140  K 4.8 4.5 4.2  CL 113* 116* 110  CO2 18* 18* 20*  GLUCOSE 132* 110* 97  BUN 30* 30* 38*  CREATININE 1.76* 1.78* 1.91*  CALCIUM 8.8 9.2 8.9  GFRNONAA 37* 39* 36*  GFRAA 42*  --   --   ANIONGAP  --  9 10     Hematology Recent Labs  Lab 08/25/20 1121 08/25/20 1419 08/27/20 0108  WBC 5.1 5.2 5.0  RBC 5.02 5.04 4.64  HGB 11.9* 11.5* 11.0*  HCT 37.9 38.0* 33.8*  MCV 76* 75.4* 72.8*  MCH 23.7* 22.8* 23.7*  MCHC 31.4* 30.3 32.5  RDW 20.0* 19.7* 18.5*  PLT 245 328 119*    BNP Recent Labs  Lab 08/25/20 1121 08/26/20 0622  BNP  --  2,235.1*  PROBNP 21,395*  --      DDimer No results for input(s): DDIMER in the last 168 hours.   Radiology    ECHOCARDIOGRAM COMPLETE  Result Date: 08/26/2020    ECHOCARDIOGRAM REPORT   Patient Name:   Ryan [name] Date of Exam: 08/26/2020 Medical Rec #:  [id number]        Height:       72.0 in Accession #:    [id number]       Weight:       190.0 lb Date of Birth:  [birth year]        BSA:          2.085 m Patient Age:    74 years         BP:           109/79 mmHg Patient Gender: M                 HR:           118 bpm. Exam Location:  Inpatient Procedure: 2D Echo, Cardiac Doppler and Color Doppler Indications:    Chest pain                 CHF-Acute systolic  History:        Patient has prior history of Echocardiogram examinations, most                 recent 07/20/2019. CHF, CAD, Mitral Valve Disease,                 Arrythmias:Atrial Fibrillation and PVC; Risk                 Factors:Hypertension and Diabetes. S/p mitral valve replacement.                 PVD.                  Mitral Valve: bioprosthetic valve valve is present in the mitral                 position.  Sonographer:    Clayton Lefort RDCS (AE) Referring Phys: [id number] [place]  1. There is substantial LV setal-lateral dyssynchrony. Left ventricular ejection fraction, by estimation, is 25 to 30%. The left ventricle has severely decreased function. The left ventricle demonstrates global hypokinesis. Left ventricular diastolic function could not be evaluated.  2. Right ventricular systolic function is severely reduced. The right ventricular size is severely enlarged. There is moderately elevated pulmonary artery systolic pressure.  3. Left atrial size was moderately dilated.  4. Right atrial size was severely dilated.  5. The mitral valve has been repaired/replaced. No evidence of mitral valve regurgitation. No evidence of mitral stenosis. The mean mitral valve gradient is 6.1 mmHg with average heart rate of 121 bpm. There is a bioprosthetic valve present in the mitral position.  6. Tricuspid valve regurgitation  is moderate.  7. The aortic valve is tricuspid. Aortic valve regurgitation is not visualized. No aortic stenosis is present.  8. There is borderline dilatation of the ascending aorta, measuring 37 mm.  9. The inferior vena cava is dilated in size with <50% respiratory variability, suggesting right atrial pressure of 15 mmHg. Comparison(s): A prior study was performed on 07/03/2019. Prior images reviewed side  by side. The left ventricular function is worsened. There is substantial worsening of right ventricular systolic function. Mitral valve gradients are lower, probably due  to lower cardiac output. There are alternating QRS complex morphologies (V sensed/V paced versus ventricular bigeminy?) throughout the study. Severe LV dyssynchrony is present on both types of beats. FINDINGS  Left Ventricle: There is substantial LV setal-lateral dyssynchrony. Left ventricular ejection fraction, by estimation, is 25 to 30%. The left ventricle has severely decreased function. The left ventricle demonstrates global hypokinesis. The left ventricular internal cavity size was normal in size. There is borderline concentric left ventricular hypertrophy. Abnormal (paradoxical) septal motion consistent with post-operative status. Left ventricular diastolic function could not be evaluated due to mitral valve replacement. Left ventricular diastolic function could not be evaluated. Right Ventricle: The right ventricular size is severely enlarged. No increase in right ventricular wall thickness. Right ventricular systolic function is severely reduced. There is moderately elevated pulmonary artery systolic pressure. The tricuspid regurgitant velocity is 2.92 m/s, and with an assumed right atrial pressure of 15 mmHg, the estimated right ventricular systolic pressure is 24.2 mmHg. Left Atrium: Left atrial size was moderately dilated. Right Atrium: Right atrial size was severely dilated. Pericardium: There is no evidence of pericardial effusion. Mitral Valve: The mitral valve has been repaired/replaced. No evidence of mitral valve regurgitation. There is a bioprosthetic valve present in the mitral position. No evidence of mitral valve stenosis. The mean mitral valve gradient is 6.1 mmHg with average heart rate of 121 bpm. Tricuspid Valve: The tricuspid valve is normal in structure. Tricuspid valve regurgitation is moderate. Aortic Valve: The aortic  valve is tricuspid. Aortic valve regurgitation is not visualized. No aortic stenosis is present. Aortic valve mean gradient measures 1.7 mmHg. Aortic valve peak gradient measures 2.7 mmHg. Aortic valve area, by VTI measures 1.95 cm. Pulmonic Valve: The pulmonic valve was normal in structure. Pulmonic valve regurgitation is mild. Aorta: The aortic root is normal in size and structure. There is borderline dilatation of the ascending aorta, measuring 37 mm. Venous: The inferior vena cava is dilated in size with less than 50% respiratory variability, suggesting right atrial pressure of 15 mmHg. IAS/Shunts: No atrial level shunt detected by color flow Doppler. Additional Comments: A pacer wire is visualized.  LEFT VENTRICLE PLAX 2D LVIDd:         4.70 cm LVIDs:         4.10 cm LV PW:         1.10 cm LV IVS:        1.18 cm LVOT diam:     2.00 cm LV SV:         25 LV SV Index:   12 LVOT Area:     3.14 cm  RIGHT VENTRICLE            IVC RV Basal diam:  5.20 cm    IVC diam: 3.20 cm RV Mid diam:    4.30 cm RV S prime:     5.63 cm/s TAPSE (M-mode): 1.0 cm LEFT ATRIUM  Index       RIGHT ATRIUM           Index LA diam:        4.30 cm 2.06 cm/m  RA Area:     30.50 cm LA Vol (A2C):   96.7 ml 46.39 ml/m RA Volume:   110.00 ml 52.77 ml/m LA Vol (A4C):   62.5 ml 29.98 ml/m LA Biplane Vol: 78.2 ml 37.51 ml/m  AORTIC VALVE AV Area (Vmax):    2.03 cm AV Area (Vmean):   1.86 cm AV Area (VTI):     1.95 cm AV Vmax:           81.70 cm/s AV Vmean:          59.067 cm/s AV VTI:            0.128 m AV Peak Grad:      2.7 mmHg AV Mean Grad:      1.7 mmHg LVOT Vmax:         52.70 cm/s LVOT Vmean:        35.000 cm/s LVOT VTI:          0.079 m LVOT/AV VTI ratio: 0.62  AORTA Ao Root diam: 3.56 cm Ao Asc diam:  3.70 cm MITRAL VALVE            TRICUSPID VALVE MV Area (PHT): 3.73 cm TR Peak grad:   34.1 mmHg MV Mean grad:  6.1 mmHg TR Vmax:        292.00 cm/s MV Decel Time: 203 msec                         SHUNTS                          Systemic VTI:  0.08 m                         Systemic Diam: 2.00 cm Sanda Klein MD Electronically signed by Sanda Klein MD Signature Date/Time: 08/26/2020/4:59:49 PM    Final     Cardiac Studies   ICD interrogation 08/25/20: ICD check in clinic. Normal device function. Unable to check threshold testing due to AS?VS rate of 133 . Impedance trends stable over time. AT/AF burden 0.3%, 7 AMS events with EGMs that show AT, longest 43 minutes. No ventricular arrhythmias. Histogram  distribution appropriate for patient and level of activity. No changes made this session. Device programmed at appropriate safety margins. Optivol elevated and patient symptomatic WC aware of findings.Device programmed to optimize intrinsic conduction.  Estimated longevity 5 years, 3 months.   Echo 07/03/19: 1. Left ventricular ejection fraction, by visual estimation, is 30 to  35%. The left ventricle has normal function. Normal left ventricular size.  There is no left ventricular hypertrophy.  2. Global right ventricle has normal systolic function.The right  ventricular size is moderately enlarged. No increase in right ventricular  wall thickness.  3. Left atrial size was normal.  4. Right atrial size was normal.  5. The mitral valve has been repaired/replaced. No evidence of mitral  valve regurgitation. No evidence of mitral stenosis.  6. Bioprosthetic mitral valve.   Mean gradient 69mmHg.  7. The tricuspid valve is normal in structure. Tricuspid valve  regurgitation is mild.  8. The aortic valve is normal in structure. Aortic valve regurgitation  was not visualized by color flow Doppler. Structurally normal aortic  valve, with no evidence of sclerosis or stenosis.  9. The pulmonic valve was normal in structure. Pulmonic valve  regurgitation is not visualized by color flow Doppler.  10. Moderately elevated pulmonary artery systolic pressure.  11. A pacer wire is visualized in the RA and  RV.  12. The inferior vena cava is normal in size with greater than 50%  respiratory variability, suggesting right atrial pressure of 3 mmHg.   Patient Profile     74 y.o. male with a history of endocarditis s/p mitral valve replacement in 2014, atrial fibrillation/flutter, HTN, DM2, and chronic systolic heart failure. He followed with EP for significant PVC burden (20%) with an EF of 30-35% and accelerated junctional rhythm - status post medtronic ICD.   Assessment & Plan    # Acute on chronic systolic heart failure EF 30-35% --> now 25-30%.  Optivol was elevated on last interrogation and BNP 2200.  He is now overdiuresed, hypotensive and has AKI.  Hold lasix and given 500 mL NS bolus.  Continue metoprolol as BP permits.  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  This tachyarrhythmia may have contributed to his worsened systolic function.  He was started on amiodarone to help maintain sinus rhythm.  BP too low for ARB/Entresto.   # Accelerated junctional rhythm # Atrial fibrillation/flutter # ICD in place On mexiletine at admission.  Will stop now that he is on amiodarone.  He was paced out of SVT and now in sinus rhythm.  Continue metoprolol.   # AoCKD:  Due to overdiuresis.  Holding lasix and giving IVF as above.   # Chronic anticoagulation - INR 1.7.  Continue warfarin.   # Chest pain - widely patent coronaries in 2014 - hs troponin 25 --> 30 - no plan for ischemia evaluation  # Hx of endocarditis s/p mitral valve replacement (2014) - no MR on echo yesterday   For questions or updates, please contact [place] Please consult [email or web address] for contact info under        Signed, Ausencio Vaden C. Oval Linsey, MD, [facility]  08/28/2020, 9:22 AM

## 2020-08-28 NOTE — Progress Notes (Addendum)
Electrophysiology Rounding Note  Patient Name: SLADE PIERPOINT Date of Encounter: 08/28/2020  Primary Cardiologist: [name], MD Electrophysiologist: Constance Haw, MD   Subjective   The patient is overall feeling better today. At this time, the patient denies chest pain or any new concerns.  Inpatient Medications    Scheduled Meds: . atorvastatin  40 mg Oral q1800  . furosemide  80 mg Intravenous BID  . metoprolol tartrate  12.5 mg Oral BID  . mexiletine  250 mg Oral Q12H  . multivitamin with minerals   Oral Once per day on Mon Wed Fri  . pantoprazole  40 mg Oral Daily  . tamsulosin  0.4 mg Oral Daily  . warfarin  7.5 mg Oral ONCE-1600  . Warfarin - Pharmacist Dosing Inpatient   Does not apply q1600   Continuous Infusions: . amiodarone 30 mg/hr (08/28/20 0806)   PRN Meds: acetaminophen, ondansetron (ZOFRAN) IV   Vital Signs    Vitals:   08/27/20 2350 08/28/20 0400 08/28/20 0748 08/28/20 0800  BP: 93/63 100/75  112/71  Pulse:      Resp: 16 19  (!) 22  Temp: 98.7 F (37.1 C) 98.4 F (36.9 C) 98.3 F (36.8 C)   TempSrc: Oral Oral Oral   SpO2: 100%     Weight:  83 kg    Height:        Intake/Output Summary (Last 24 hours) at 08/28/2020 0832 Last data filed at 08/28/2020 0455 Gross per 24 hour  Intake 840 ml  Output 375 ml  Net 465 ml   Filed Weights   08/26/20 1720 08/27/20 0600 08/28/20 0400  Weight: 80.9 kg 81.2 kg 83 kg    Physical Exam    GEN- The patient is well appearing, alert and oriented x 3 today.   Head- normocephalic, atraumatic Eyes-  Sclera clear, conjunctiva pink Ears- hearing intact Oropharynx- clear Neck- supple, JVP appears elevated to at least 9-10 Lungs- Clear to ausculation bilaterally, normal work of breathing Heart- Tachy, but regular rate and rhythm, no murmurs, rubs or gallops GI- soft, NT, ND, + BS Extremities- no clubbing or cyanosis.  Skin- no rash or lesion Psych- euthymic mood, full affect Neuro-  strength and sensation are intact  Labs    CBC Recent Labs    08/25/20 1419 08/27/20 0108  WBC 5.2 5.0  HGB 11.5* 11.0*  HCT 38.0* 33.8*  MCV 75.4* 72.8*  PLT 328 664*   Basic Metabolic Panel Recent Labs    08/25/20 1121 08/25/20 1419 08/26/20 0622 08/27/20 0108  NA 144 143  --  140  K 4.8 4.5  --  4.2  CL 113* 116*  --  110  CO2 18* 18*  --  20*  GLUCOSE 132* 110*  --  97  BUN 30* 30*  --  38*  CREATININE 1.76* 1.78*  --  1.91*  CALCIUM 8.8 9.2  --  8.9  MG 2.4*  --  2.4  --    Liver Function Tests No results for input(s): AST, ALT, ALKPHOS, BILITOT, PROT, ALBUMIN in the last 72 hours. No results for input(s): LIPASE, AMYLASE in the last 72 hours. Cardiac Enzymes No results for input(s): CKTOTAL, CKMB, CKMBINDEX, TROPONINI in the last 72 hours.   Telemetry    SVT in 110s (personally reviewed)  Radiology    ECHOCARDIOGRAM COMPLETE  Result Date: 08/26/2020    ECHOCARDIOGRAM REPORT   Patient Name:   CHUKWUEMEKA ARTOLA Date of Exam: 08/26/2020 Medical  Rec #:  [id number]        Height:       72.0 in Accession #:    [id number]       Weight:       190.0 lb Date of Birth:  07-Jan-1946        BSA:          2.085 m Patient Age:    74 years         BP:           109/79 mmHg Patient Gender: M                HR:           118 bpm. Exam Location:  Inpatient Procedure: 2D Echo, Cardiac Doppler and Color Doppler Indications:    Chest pain                 CHF-Acute systolic  History:        Patient has prior history of Echocardiogram examinations, most                 recent 07/20/2019. CHF, CAD, Mitral Valve Disease,                 Arrythmias:Atrial Fibrillation and PVC; Risk                 Factors:Hypertension and Diabetes. S/p mitral valve replacement.                 PVD.                  Mitral Valve: bioprosthetic valve valve is present in the mitral                 position.  Sonographer:    Clayton Lefort RDCS (AE) Referring Phys: [id number] [place]  1. There is  substantial LV setal-lateral dyssynchrony. Left ventricular ejection fraction, by estimation, is 25 to 30%. The left ventricle has severely decreased function. The left ventricle demonstrates global hypokinesis. Left ventricular diastolic function could not be evaluated.  2. Right ventricular systolic function is severely reduced. The right ventricular size is severely enlarged. There is moderately elevated pulmonary artery systolic pressure.  3. Left atrial size was moderately dilated.  4. Right atrial size was severely dilated.  5. The mitral valve has been repaired/replaced. No evidence of mitral valve regurgitation. No evidence of mitral stenosis. The mean mitral valve gradient is 6.1 mmHg with average heart rate of 121 bpm. There is a bioprosthetic valve present in the mitral position.  6. Tricuspid valve regurgitation is moderate.  7. The aortic valve is tricuspid. Aortic valve regurgitation is not visualized. No aortic stenosis is present.  8. There is borderline dilatation of the ascending aorta, measuring 37 mm.  9. The inferior vena cava is dilated in size with <50% respiratory variability, suggesting right atrial pressure of 15 mmHg. Comparison(s): A prior study was performed on 07/03/2019. Prior images reviewed side by side. The left ventricular function is worsened. There is substantial worsening of right ventricular systolic function. Mitral valve gradients are lower, probably due  to lower cardiac output. There are alternating QRS complex morphologies (V sensed/V paced versus ventricular bigeminy?) throughout the study. Severe LV dyssynchrony is present on both types of beats. FINDINGS  Left Ventricle: There is substantial LV setal-lateral dyssynchrony. Left ventricular ejection fraction, by estimation, is 25 to 30%. The left ventricle has severely decreased function. The left ventricle  demonstrates global hypokinesis. The left ventricular internal cavity size was normal in size. There is borderline  concentric left ventricular hypertrophy. Abnormal (paradoxical) septal motion consistent with post-operative status. Left ventricular diastolic function could not be evaluated due to mitral valve replacement. Left ventricular diastolic function could not be evaluated. Right Ventricle: The right ventricular size is severely enlarged. No increase in right ventricular wall thickness. Right ventricular systolic function is severely reduced. There is moderately elevated pulmonary artery systolic pressure. The tricuspid regurgitant velocity is 2.92 m/s, and with an assumed right atrial pressure of 15 mmHg, the estimated right ventricular systolic pressure is 09.3 mmHg. Left Atrium: Left atrial size was moderately dilated. Right Atrium: Right atrial size was severely dilated. Pericardium: There is no evidence of pericardial effusion. Mitral Valve: The mitral valve has been repaired/replaced. No evidence of mitral valve regurgitation. There is a bioprosthetic valve present in the mitral position. No evidence of mitral valve stenosis. The mean mitral valve gradient is 6.1 mmHg with average heart rate of 121 bpm. Tricuspid Valve: The tricuspid valve is normal in structure. Tricuspid valve regurgitation is moderate. Aortic Valve: The aortic valve is tricuspid. Aortic valve regurgitation is not visualized. No aortic stenosis is present. Aortic valve mean gradient measures 1.7 mmHg. Aortic valve peak gradient measures 2.7 mmHg. Aortic valve area, by VTI measures 1.95 cm. Pulmonic Valve: The pulmonic valve was normal in structure. Pulmonic valve regurgitation is mild. Aorta: The aortic root is normal in size and structure. There is borderline dilatation of the ascending aorta, measuring 37 mm. Venous: The inferior vena cava is dilated in size with less than 50% respiratory variability, suggesting right atrial pressure of 15 mmHg. IAS/Shunts: No atrial level shunt detected by color flow Doppler. Additional Comments: A pacer wire  is visualized.  LEFT VENTRICLE PLAX 2D LVIDd:         4.70 cm LVIDs:         4.10 cm LV PW:         1.10 cm LV IVS:        1.18 cm LVOT diam:     2.00 cm LV SV:         25 LV SV Index:   12 LVOT Area:     3.14 cm  RIGHT VENTRICLE            IVC RV Basal diam:  5.20 cm    IVC diam: 3.20 cm RV Mid diam:    4.30 cm RV S prime:     5.63 cm/s TAPSE (M-mode): 1.0 cm LEFT ATRIUM             Index       RIGHT ATRIUM           Index LA diam:        4.30 cm 2.06 cm/m  RA Area:     30.50 cm LA Vol (A2C):   96.7 ml 46.39 ml/m RA Volume:   110.00 ml 52.77 ml/m LA Vol (A4C):   62.5 ml 29.98 ml/m LA Biplane Vol: 78.2 ml 37.51 ml/m  AORTIC VALVE AV Area (Vmax):    2.03 cm AV Area (Vmean):   1.86 cm AV Area (VTI):     1.95 cm AV Vmax:           81.70 cm/s AV Vmean:          59.067 cm/s AV VTI:            0.128 m AV Peak Grad:  2.7 mmHg AV Mean Grad:      1.7 mmHg LVOT Vmax:         52.70 cm/s LVOT Vmean:        35.000 cm/s LVOT VTI:          0.079 m LVOT/AV VTI ratio: 0.62  AORTA Ao Root diam: 3.56 cm Ao Asc diam:  3.70 cm MITRAL VALVE            TRICUSPID VALVE MV Area (PHT): 3.73 cm TR Peak grad:   34.1 mmHg MV Mean grad:  6.1 mmHg TR Vmax:        292.00 cm/s MV Decel Time: 203 msec                         SHUNTS                         Systemic VTI:  0.08 m                         Systemic Diam: 2.00 cm Dani Gobble Croitoru MD Electronically signed by Sanda Klein MD Signature Date/Time: 08/26/2020/4:59:49 PM    Final     Patient Profile     DORIEN MAYOTTE is a 74 y.o. male with a history of endocarditis s/p mitral valve replacement in 2014, atrial fibrillation/flutter, HTN, DM2, and chronic systolic heart failure. He followed with EP for significant PVC burden (20%) with an EF of 30-35% and accelerated junctional rhythm - status post medtronic ICD.   Assessment & Plan    1. SVT Continue amiodarione Patient paced out again this am using his device. Successful at [facility] at 12 burst at 440 ms (Tachycardia CL was  520 ms) Continue BB  2. Acute on chronic systolic CHF Remains at least moderately volume overloaded. Continue IV lasix at least this am BMET pending.   3. PAF On coumadin for CHA2DS2VASC of at least 7 (h/o CVA per chart)  Continue IV amiodarone. No s/p load hopefully Liset Mcmonigle hold NSR and continue to diurese well. Consider transition to po tomorrow if remains stable.      For questions or updates, please contact [place] Please consult [email or web address] for contact info under Cardiology/STEMI.  Signed, Shirley Friar, PA-C  08/28/2020, 8:32 AM   I have seen and examined this patient with [name].  Agree with above, note added to reflect my findings.  On exam, RRR, no murmurs, lungs clear, 1-2+ lower extremity edema, JVD to the mid neck.  He remains volume overloaded and does need further Lasix.  He has done well on his amiodarone.  It did slow down his SVT.  We were able to pace him out of it again this morning.  We Garald Rhew continue IV amiodarone through the day today.  We Elta Angell switch him to 400 mg twice daily tomorrow for 2 weeks followed by 200 mg a day.  If he does go back into SVT, feel free to call us and we Makayla Lanter make further adjustments to his medications.  Stopping mexiletine is also reasonable.  Loreena Valeri M. Kayle Correa MD 08/28/2020 9:35 AM

## 2020-08-28 NOTE — Progress Notes (Addendum)
Paged for hypotension with systolic of 78 s/p medication administration this am.   Received IV lasix, lopressor and remains on IV amiodarone.   Amiodarone paused.  He has maintained NSR since being paced out this am.   Pt with mild nausea but otherwise asymptomatic.   Agree with pausing amiodarone. BMET pending. If Cr trending up will stop IV lasix.  Will re-assess BP for IV amiodarone resumption in 45-60 minutes  Beryle Beams" [place], [place]  08/28/2020 9:52 AM   ADDENDUM: Reviewed with Dr. [name]  Stop IV amiodarone with recurrent hypotension. Transition to po at 400 mg BID.   Legrand Como [address]" [place], PA-C  08/28/2020 10:48 AM

## 2020-08-28 NOTE — Progress Notes (Signed)
ReDS Clip Diuretic Study Pt study # [id number]  Your patient is in the Blinded arm of the ReDS Clip Diuretic study.  Your patient has had a ReDS reading and the reading has been transmitted to the cloud.   Thank You   The research team   Antonietta Jewel, PharmD, [place] Clinical Pharmacist  Phone: [phone] 08/28/2020 2:18 PM  Please check AMION for all [place] phone numbers After 10:00 PM, call [place] [phone]

## 2020-08-28 NOTE — Progress Notes (Addendum)
  Pt continues to go in and out of SVT. Rates currently in 100s. Pt denies symptoms at rest.   AKI noted and given IVF 500 bolus.   Will attempt to resume amiodarione gtt at 30 mg/hr as over-diuresis may have been contributing to hypotension.   Discussed above with Dr. Georgiana Shore "Jonni Sanger" [place], [place]  08/28/2020 2:00 PM

## 2020-08-28 NOTE — H&P (View-Only) (Signed)
  Pt paced back into NSR using MDT programmer.  Now in NSR in 80s with stable BP. Hopefully normal rhythm will promote better blood pressure, which will allow him to maintain on IV amiodarone.   Legrand Como [address]" [place], PA-C  08/28/2020 4:01 PM

## 2020-08-28 NOTE — Progress Notes (Signed)
ANTICOAGULATION CONSULT NOTE - Initial Consult  Pharmacy Consult for Coumadin Indication: atrial fibrillation  Allergies  Allergen Reactions  . Tuna [Fish Allergy] Nausea And Vomiting    Patient Measurements: Height: 6' (182.9 cm) Weight: 83 kg (182 lb 15.7 oz) IBW/kg (Calculated) : 77.6  Vital Signs: Temp: 98.4 F (36.9 C) (12/09 0400) Temp Source: Oral (12/09 0400) BP: 100/75 (12/09 0400) Pulse Rate: 111 (12/08 1953)  Labs: Recent Labs    08/25/20 1121 08/25/20 1419 08/25/20 2112 08/26/20 0621 08/27/20 0108 08/28/20 0402  HGB 11.9* 11.5*  --   --  11.0*  --   HCT 37.9 38.0*  --   --  33.8*  --   PLT 245 328  --   --  119*  --   LABPROT  --   --   --  21.0* 21.4* 19.2*  INR  --   --   --  1.9* 1.9* 1.7*  CREATININE 1.76* 1.78*  --   --  1.91*  --   TROPONINIHS  --  25* 30*  --   --   --     Estimated Creatinine Clearance: 36.1 mL/min (A) (by C-G formula based on SCr of 1.91 mg/dL (H)).   Medical History: Past Medical History:  Diagnosis Date  . Atrial fibrillation ([place])   . 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 Diabetes mellitus without complication ([place])   . Endocarditis   . Hypertension   . Peripheral vascular disease ([place])   . PVC's (premature ventricular contractions)   . SVT (supraventricular tachycardia)  long RP     Assessment: Anticoag: Afib/flutter, MVR 2014. CHADS2VASC 6. INR 1.9. Noted Plts 328>119  on 12/8? F/u recheck. Resumed Coumadin 12/8. Monitor for drug interactions with amiodarone infusion. INR 1.7 (no coum 12/6 or 12/7) - Coumadin PTA 5mg  daily. Admit INR 1.9.  Goal of Therapy:  INR 2-3 Monitor platelets by anticoagulation protocol: Yes   Plan:  Coumadin 7.5mg  po x 1 again tonight. Daily INR   Angele Wiemann S. Alford Highland, PharmD, BCPS Clinical Staff Pharmacist [email or web address] Alford Highland, [employer] 08/28/2020,7:35 AM

## 2020-08-28 NOTE — Progress Notes (Signed)
  Pt paced back into NSR using MDT programmer.  Now in NSR in 80s with stable BP. Hopefully normal rhythm will promote better blood pressure, which will allow him to maintain on IV amiodarone.   Legrand Como [address]" [place], PA-C  08/28/2020 4:01 PM

## 2020-08-28 NOTE — Progress Notes (Signed)
[name], PA made aware of B/P 78/54. Amiodarone has been stopped and I will continue to monitor B/P to hopefully be able to turn the Amio back on if systolic blood pressure is greater than 85.

## 2020-08-29 ENCOUNTER — Encounter (HOSPITAL_COMMUNITY)
Admission: EM | Disposition: A | Discharge status: Home Health | Payer: Self-pay | Source: Home / Self Care | Attending: Cardiovascular Disease

## 2020-08-29 ENCOUNTER — Encounter (HOSPITAL_COMMUNITY): Payer: Self-pay | Admitting: Cardiology

## 2020-08-29 DIAGNOSIS — I5082 Biventricular heart failure: Secondary | ICD-10-CM

## 2020-08-29 DIAGNOSIS — N179 Acute kidney failure, unspecified: Secondary | ICD-10-CM

## 2020-08-29 HISTORY — PX: RIGHT HEART CATH: CATH118263

## 2020-08-29 LAB — POCT I-STAT EG7
Acid-Base Excess: 0 mmol/L (ref 0.0–2.0)
Acid-Base Excess: 1 mmol/L (ref 0.0–2.0)
Bicarbonate: 24.3 mmol/L (ref 20.0–28.0)
Bicarbonate: 25.2 mmol/L (ref 20.0–28.0)
Calcium, Ion: 1.14 mmol/L — ABNORMAL LOW (ref 1.15–1.40)
Calcium, Ion: 1.21 mmol/L (ref 1.15–1.40)
HCT: 34 % — ABNORMAL LOW (ref 39.0–52.0)
HCT: 35 % — ABNORMAL LOW (ref 39.0–52.0)
Hemoglobin: 11.6 g/dL — ABNORMAL LOW (ref 13.0–17.0)
Hemoglobin: 11.9 g/dL — ABNORMAL LOW (ref 13.0–17.0)
O2 Saturation: 58 %
O2 Saturation: 59 %
Potassium: 3.6 mmol/L (ref 3.5–5.1)
Potassium: 3.6 mmol/L (ref 3.5–5.1)
Sodium: 141 mmol/L (ref 135–145)
Sodium: 141 mmol/L (ref 135–145)
TCO2: 25 mmol/L (ref 22–32)
TCO2: 26 mmol/L (ref 22–32)
pCO2, Ven: 38.1 mmHg — ABNORMAL LOW (ref 44.0–60.0)
pCO2, Ven: 38.6 mmHg — ABNORMAL LOW (ref 44.0–60.0)
pH, Ven: 7.413 (ref 7.250–7.430)
pH, Ven: 7.422 (ref 7.250–7.430)
pO2, Ven: 29 mmHg — CL (ref 32.0–45.0)
pO2, Ven: 30 mmHg — CL (ref 32.0–45.0)

## 2020-08-29 LAB — BASIC METABOLIC PANEL
Anion gap: 12 (ref 5–15)
BUN: 43 mg/dL — ABNORMAL HIGH (ref 8–23)
CO2: 20 mmol/L — ABNORMAL LOW (ref 22–32)
Calcium: 8.7 mg/dL — ABNORMAL LOW (ref 8.9–10.3)
Chloride: 107 mmol/L (ref 98–111)
Creatinine, Ser: 2.36 mg/dL — ABNORMAL HIGH (ref 0.61–1.24)
GFR, Estimated: 28 mL/min — ABNORMAL LOW (ref >60)
Glucose, Bld: 138 mg/dL — ABNORMAL HIGH (ref 70–99)
Potassium: 3.9 mmol/L (ref 3.5–5.1)
Sodium: 139 mmol/L (ref 135–145)

## 2020-08-29 LAB — GLUCOSE, CAPILLARY
Glucose-Capillary: 96 mg/dL (ref 70–99)
Glucose-Capillary: 117 mg/dL — ABNORMAL HIGH (ref 70–99)

## 2020-08-29 LAB — PROTIME-INR
INR: 1.5 — ABNORMAL HIGH (ref 0.8–1.2)
Prothrombin Time: 17.6 seconds — ABNORMAL HIGH (ref 11.4–15.2)

## 2020-08-29 SURGERY — RIGHT HEART CATH
Anesthesia: LOCAL

## 2020-08-29 MED ORDER — AMIODARONE HCL 200 MG PO TABS
400.0000 mg | ORAL_TABLET | Freq: Two times a day (BID) | ORAL | Status: DC
  Administered 2020-08-29: 400 mg via ORAL
  Filled 2020-08-29: qty 2

## 2020-08-29 MED ORDER — AMIODARONE HCL IN DEXTROSE 360-4.14 MG/200ML-% IV SOLN
30.0000 mg/h | INTRAVENOUS | Status: DC
  Administered 2020-08-29 – 2020-09-02 (×10): 30 mg/h via INTRAVENOUS
  Filled 2020-08-29 (×11): qty 200

## 2020-08-29 MED ORDER — METOPROLOL TARTRATE 25 MG PO TABS
25.0000 mg | ORAL_TABLET | Freq: Two times a day (BID) | ORAL | Status: DC
  Administered 2020-08-29: 25 mg via ORAL
  Filled 2020-08-29 (×2): qty 1

## 2020-08-29 MED ORDER — SODIUM CHLORIDE 0.9% FLUSH: 3.0000 mL | INTRAVENOUS | Status: DC | PRN

## 2020-08-29 MED ORDER — WARFARIN SODIUM 7.5 MG PO TABS
7.5000 mg | ORAL_TABLET | Freq: Once | ORAL | Status: AC
  Administered 2020-08-29: 7.5 mg via ORAL
  Filled 2020-08-29: qty 1

## 2020-08-29 MED ORDER — LIDOCAINE HCL (PF) 1 % IJ SOLN
INTRAMUSCULAR | Status: DC | PRN
  Administered 2020-08-29: 2 mL

## 2020-08-29 MED ORDER — SODIUM CHLORIDE 0.9% FLUSH
3.0000 mL | Freq: Two times a day (BID) | INTRAVENOUS | Status: DC
  Administered 2020-08-30 – 2020-09-03 (×7): 3 mL via INTRAVENOUS

## 2020-08-29 MED ORDER — SODIUM CHLORIDE 0.9 % IV SOLN: 250.0000 mL | INTRAVENOUS | Status: DC | PRN

## 2020-08-29 MED ORDER — SODIUM CHLORIDE 0.9 % IV SOLN: INTRAVENOUS | Status: DC

## 2020-08-29 MED ORDER — FUROSEMIDE 10 MG/ML IJ SOLN
INTRAMUSCULAR | Status: AC
  Filled 2020-08-29: qty 4

## 2020-08-29 MED ORDER — FUROSEMIDE 10 MG/ML IJ SOLN
12.0000 mg/h | INTRAVENOUS | Status: DC
  Administered 2020-08-29 – 2020-09-01 (×5): 12 mg/h via INTRAVENOUS
  Filled 2020-08-29 (×6): qty 20

## 2020-08-29 MED ORDER — MIDODRINE HCL 5 MG PO TABS
2.5000 mg | ORAL_TABLET | Freq: Two times a day (BID) | ORAL | Status: DC
  Administered 2020-08-29 – 2020-09-03 (×11): 2.5 mg via ORAL
  Filled 2020-08-29 (×11): qty 1

## 2020-08-29 MED ORDER — CHLORHEXIDINE GLUCONATE CLOTH 2 % EX PADS
6.0000 | MEDICATED_PAD | Freq: Every day | CUTANEOUS | Status: DC
  Administered 2020-08-29 – 2020-09-05 (×7): 6 via TOPICAL

## 2020-08-29 MED ORDER — FUROSEMIDE 10 MG/ML IJ SOLN
80.0000 mg | Freq: Once | INTRAMUSCULAR | Status: AC
  Administered 2020-08-29: 80 mg via INTRAVENOUS

## 2020-08-29 MED ORDER — FUROSEMIDE 10 MG/ML IJ SOLN
INTRAMUSCULAR | Status: DC | PRN
  Administered 2020-08-29: 80 mg via INTRAVENOUS

## 2020-08-29 MED ORDER — SODIUM CHLORIDE 0.9 % IV SOLN
INTRAVENOUS | Status: AC | PRN
  Administered 2020-08-29: 10 mL/h via INTRAVENOUS

## 2020-08-29 MED ORDER — HEPARIN (PORCINE) IN NACL 1000-0.9 UT/500ML-% IV SOLN
INTRAVENOUS | Status: AC
  Filled 2020-08-29: qty 500

## 2020-08-29 MED ORDER — LIDOCAINE HCL (PF) 1 % IJ SOLN
INTRAMUSCULAR | Status: AC
  Filled 2020-08-29: qty 30

## 2020-08-29 MED ORDER — HEPARIN (PORCINE) IN NACL 1000-0.9 UT/500ML-% IV SOLN
INTRAVENOUS | Status: DC | PRN
  Administered 2020-08-29: 500 mL

## 2020-08-29 MED ORDER — POTASSIUM CHLORIDE CRYS ER 20 MEQ PO TBCR
40.0000 meq | EXTENDED_RELEASE_TABLET | Freq: Once | ORAL | Status: AC
  Administered 2020-08-29: 40 meq via ORAL
  Filled 2020-08-29: qty 2

## 2020-08-29 MED ORDER — ASPIRIN 81 MG PO CHEW: 81.0000 mg | CHEWABLE_TABLET | ORAL | Status: DC

## 2020-08-29 SURGICAL SUPPLY — 9 items
CATH SWAN GANZ 7F STRAIGHT (CATHETERS) ×1 IMPLANT
GLIDESHEATH SLENDER 7FR .021G (SHEATH) ×1 IMPLANT
KIT HEART LEFT (KITS) ×2 IMPLANT
PACK CARDIAC CATHETERIZATION (CUSTOM PROCEDURE TRAY) ×2 IMPLANT
SHEATH GLIDE SLENDER 4/5FR (SHEATH) ×1 IMPLANT
SLEEVE REPOSITIONING LENGTH 30 (MISCELLANEOUS) ×1 IMPLANT
TRANSDUCER W/STOPCOCK (MISCELLANEOUS) ×2 IMPLANT
TUBING ART PRESS 72  MALE/FEM (TUBING) ×2
TUBING ART PRESS 72 MALE/FEM (TUBING) IMPLANT

## 2020-08-29 NOTE — Progress Notes (Signed)
Progress Note  Patient Name: Ryan [name] Date of Encounter: 08/29/2020  [facility] Cardiologist: [name] III, MD   Subjective   Pt describes orthopnea last evening and required O2. He work up feeling short of breath. No crackles in bases or significant LE edema this morning.   Inpatient Medications    Scheduled Meds: . amiodarone  400 mg Oral BID  . atorvastatin  40 mg Oral q1800  . metoprolol tartrate  25 mg Oral BID  . multivitamin with minerals   Oral Once per day on Mon Wed Fri  . pantoprazole  40 mg Oral Daily  . polyethylene glycol  17 g Oral Daily  . tamsulosin  0.4 mg Oral Daily  . warfarin  7.5 mg Oral ONCE-1600  . Warfarin - Pharmacist Dosing Inpatient   Does not apply q1600   Continuous Infusions: . amiodarone Stopped (08/29/20 0301)   PRN Meds: acetaminophen, ondansetron (ZOFRAN) IV, senna-docusate   Vital Signs    Vitals:   08/29/20 0019 08/29/20 0131 08/29/20 0136 08/29/20 0357  BP: 91/61 (!) 86/56 94/65 110/82  Pulse: 84 69 69 (!) 103  Resp: 13 (!) 21 (!) 21 20  Temp: 98.1 F (36.7 C)   98.1 F (36.7 C)  TempSrc: Oral   Oral  SpO2: 98% 100% 97% 100%  Weight: 81.2 kg     Height:        Intake/Output Summary (Last 24 hours) at 08/29/2020 0948 Last data filed at 08/29/2020 0900 Gross per 24 hour  Intake 1463.07 ml  Output 1300 ml  Net 163.07 ml   Last 3 Weights 08/29/2020 08/28/2020 08/27/2020  Weight (lbs) 179 lb 182 lb 15.7 oz 179 lb 0.2 oz  Weight (kg) 81.194 kg 83 kg 81.2 kg      Telemetry    Pt was in sinus rhythm 0045-0345, now HR back in the 100s - Personally Reviewed  ECG    No new tracings - Personally Reviewed  Physical Exam   GEN: No acute distress.   Neck: No JVD Cardiac: regular rhythm, tachycardic rate Respiratory: Clear to auscultation bilaterally. GI: Soft, nontender, non-distended  MS: No edema; No deformity. Neuro:  Nonfocal  Psych: Normal affect   Labs    High Sensitivity Troponin:   Recent  Labs  Lab 08/25/20 1419 08/25/20 2112  TROPONINIHS 25* 30*      Chemistry Recent Labs  Lab 08/25/20 1121 08/25/20 1419 08/27/20 0108 08/28/20 0402 08/29/20 0250  NA 144   < > 140 140 139  K 4.8   < > 4.2 3.8 3.9  CL 113*   < > 110 108 107  CO2 18*   < > 20* 22 20*  GLUCOSE 132*   < > 97 106* 138*  BUN 30*   < > 38* 42* 43*  CREATININE 1.76*   < > 1.91* 2.11* 2.36*  CALCIUM 8.8   < > 8.9 8.6* 8.7*  GFRNONAA 37*   < > 36* 32* 28*  GFRAA 42*  --   --   --   --   ANIONGAP  --    < > 10 10 12    < > = values in this interval not displayed.     Hematology Recent Labs  Lab 08/25/20 1121 08/25/20 1419 08/27/20 0108  WBC 5.1 5.2 5.0  RBC 5.02 5.04 4.64  HGB 11.9* 11.5* 11.0*  HCT 37.9 38.0* 33.8*  MCV 76* 75.4* 72.8*  MCH 23.7* 22.8* 23.7*  MCHC 31.4*  30.3 32.5  RDW 20.0* 19.7* 18.5*  PLT 245 328 119*    BNP Recent Labs  Lab 08/25/20 1121 08/26/20 0622  BNP  --  2,235.1*  PROBNP 21,395*  --      DDimer No results for input(s): DDIMER in the last 168 hours.   Radiology    No results found.  Cardiac Studies   ICD interrogation 08/25/20: ICD check in clinic. Normal device function. Unable to check threshold testing due to AS?VS rate of 133 . Impedance trends stable over time. AT/AF burden 0.3%, 7 AMS events with EGMs that show AT, longest 43 minutes. No ventricular arrhythmias. Histogram  distribution appropriate for patient and level of activity. No changes made this session. Device programmed at appropriate safety margins. Optivol elevated and patient symptomatic WC aware of findings.Device programmed to optimize intrinsic conduction.  Estimated longevity 5 years, 3 months.   Echo 07/03/19: 1. Left ventricular ejection fraction, by visual estimation, is 30 to  35%. The left ventricle has normal function. Normal left ventricular size.  There is no left ventricular hypertrophy.  2. Global right ventricle has normal systolic function.The right   ventricular size is moderately enlarged. No increase in right ventricular  wall thickness.  3. Left atrial size was normal.  4. Right atrial size was normal.  5. The mitral valve has been repaired/replaced. No evidence of mitral  valve regurgitation. No evidence of mitral stenosis.  6. Bioprosthetic mitral valve.   Mean gradient 57mmHg.  7. The tricuspid valve is normal in structure. Tricuspid valve  regurgitation is mild.  8. The aortic valve is normal in structure. Aortic valve regurgitation  was not visualized by color flow Doppler. Structurally normal aortic  valve, with no evidence of sclerosis or stenosis.  9. The pulmonic valve was normal in structure. Pulmonic valve  regurgitation is not visualized by color flow Doppler.  10. Moderately elevated pulmonary artery systolic pressure.  11. A pacer wire is visualized in the RA and RV.  12. The inferior vena cava is normal in size with greater than 50%  respiratory variability, suggesting right atrial pressure of 3 mmHg.   Patient Profile     74 y.o. male with a history of endocarditis s/p mitral valve replacement in 2014, atrial fibrillation/flutter, HTN, DM2, and chronic systolic heart failure. He followed with EP for significant PVC burden (20%) with an EF of 30-35% and accelerated junctional rhythm - status post medtronic ICD.  Assessment & Plan    SVT Accelerated junctional rhythm PVCs AVNRT per EP - on mexiletine at admission - now D/C'ed in the setting of amiodarone - continue amiodarone --> now on PO due to marginal BP on IV - EP paced out of SVT, unfortunately, appears back in accelerated junctional rhythm - lopressor increased to 25 mg BID - appreciate EP recs   PAF Chronic anticoagulation - pt remains on coumadin with a This patients CHA2DS2-VASc Score and unadjusted Ischemic Stroke Rate (% per year) is equal to 11.2 % stroke rate/year from a score of 7 (2age, 2CVA, DM, HTN, PVD) - INR 1.5 today - will  defer need for lovenox bridge to EP   Acute on chronic systolic heart failure - EF 30-35% --> now 25-30% - optivol was elevated on last interrogation and BNP 2200 - diuresis stopped yesterday due to overdiuresis, hypotension, and AKI - lasix on hold and was given 500 cc fluid bolus - continue BB - Dr. [name] has requested consult to AHF service - appreciate their input -  had sudden SOB last night while sleeping, required O2 overnight   Acute on chronic renal insufficiency - secondary to overdiuresis - sCr 2.36 (2.11) - baseline appears to be 1.7   Chest pain - widely patent coronaries in 2014 - hs troponin 25 --> 30 - no plan for ischemic evaluation    Hx of endocarditis s/p mitral valve replacement 2014 - no MR on echo    For questions or updates, please contact [facility] Please consult [email or web address] for contact info under        Signed, Ledora Bottcher, PA  08/29/2020, 9:48 AM

## 2020-08-29 NOTE — Progress Notes (Signed)
ANTICOAGULATION CONSULT NOTE - Follow Up Consult  Pharmacy Consult for Coumadin Indication: atrial fibrillation  Allergies  Allergen Reactions  . Tuna [Fish Allergy] Nausea And Vomiting    Patient Measurements: Height: 6' (182.9 cm) Weight: 81.2 kg (179 lb) IBW/kg (Calculated) : 77.6  Vital Signs: Temp: 98.1 F (36.7 C) (12/10 0357) Temp Source: Oral (12/10 0357) BP: 110/82 (12/10 0357) Pulse Rate: 103 (12/10 0357)  Labs: Recent Labs    08/27/20 0108 08/28/20 0402 08/29/20 0250  HGB 11.0*  --   --   HCT 33.8*  --   --   PLT 119*  --   --   LABPROT 21.4* 19.2* 17.6*  INR 1.9* 1.7* 1.5*  CREATININE 1.91* 2.11* 2.36*    Estimated Creatinine Clearance: 29.2 mL/min (A) (by C-G formula based on SCr of 2.36 mg/dL (H)).   Medical History: Past Medical History:  Diagnosis Date  . AICD (automatic cardioverter/defibrillator) present   . Atrial fibrillation ([place])   . 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 CHF (congestive heart failure) ([place])   . Diabetes mellitus without complication ([place])   . Endocarditis   . Hypertension   . Peripheral vascular disease ([place])   . PVC's (premature ventricular contractions)   . SVT (supraventricular tachycardia)  long RP     Assessment: 74 yo male presented on 08/25/2020 as a direct admission from EP clinic for significant SOB with minimal exertion, PND, and orthopnea. Patient is s/p bioprosthetic mitral valve repair in 2014. Patient is on warfarin prior to admission for Afib. INR 1.9 on admission. Today, INR 1.5 is subtherapeutic. Patient is on amiodarone infusion which is currently paused for hypotension.   Prior to admission warfarin regimen: warfarin 5mg  daily   Goal of Therapy:  INR 2-3 Monitor platelets by anticoagulation protocol: Yes   Plan:  Warfarin 7.5mg  x1 tonight  Monitor INR, CBC and S/S of bleeding daily   Cristela Felt, PharmD Clinical Pharmacist   08/29/2020,8:29 AM

## 2020-08-29 NOTE — Progress Notes (Addendum)
Electrophysiology Rounding Note  Patient Name: Ryan [name] Date of Encounter: 08/29/2020  Primary Cardiologist: [name], MD Electrophysiologist: Constance Haw, MD   Subjective   Pt feeling OK this am. No new complaints. Continue to go in and out of rhythm.   Inpatient Medications    Scheduled Meds: . atorvastatin  40 mg Oral q1800  . metoprolol tartrate  12.5 mg Oral BID  . multivitamin with minerals   Oral Once per day on Mon Wed Fri  . pantoprazole  40 mg Oral Daily  . polyethylene glycol  17 g Oral Daily  . tamsulosin  0.4 mg Oral Daily  . warfarin  7.5 mg Oral ONCE-1600  . Warfarin - Pharmacist Dosing Inpatient   Does not apply q1600   Continuous Infusions: . amiodarone Stopped (08/29/20 0301)   PRN Meds: acetaminophen, ondansetron (ZOFRAN) IV, senna-docusate   Vital Signs    Vitals:   08/29/20 0019 08/29/20 0131 08/29/20 0136 08/29/20 0357  BP: 91/61 (!) 86/56 94/65 110/82  Pulse: 84 69 69 (!) 103  Resp: 13 (!) 21 (!) 21 20  Temp: 98.1 F (36.7 C)   98.1 F (36.7 C)  TempSrc: Oral   Oral  SpO2: 98% 100% 97% 100%  Weight: 81.2 kg     Height:        Intake/Output Summary (Last 24 hours) at 08/29/2020 0853 Last data filed at 08/29/2020 0000 Gross per 24 hour  Intake 1243.07 ml  Output 1300 ml  Net -56.93 ml   Filed Weights   08/27/20 0600 08/28/20 0400 08/29/20 0019  Weight: 81.2 kg 83 kg 81.2 kg    Physical Exam    GEN- The patient is well appearing, alert and oriented x 3 today.   Head- normocephalic, atraumatic Eyes-  Sclera clear, conjunctiva pink Ears- hearing intact Oropharynx- clear Neck- supple Lungs- Clear to ausculation bilaterally, normal work of breathing Heart- slightly tachy but regular rate and rhythm, no murmurs, rubs or gallops GI- soft, NT, ND, + BS Extremities- no clubbing or cyanosis. No edema Skin- no rash or lesion Psych- euthymic mood, full affect Neuro- strength and sensation are intact  Labs     CBC Recent Labs    08/27/20 0108  WBC 5.0  HGB 11.0*  HCT 33.8*  MCV 72.8*  PLT 494*   Basic Metabolic Panel Recent Labs    08/28/20 0402 08/29/20 0250  NA 140 139  K 3.8 3.9  CL 108 107  CO2 22 20*  GLUCOSE 106* 138*  BUN 42* 43*  CREATININE 2.11* 2.36*  CALCIUM 8.6* 8.7*  MG 2.1  --    Liver Function Tests No results for input(s): AST, ALT, ALKPHOS, BILITOT, PROT, ALBUMIN in the last 72 hours. No results for input(s): LIPASE, AMYLASE in the last 72 hours. Cardiac Enzymes No results for input(s): CKTOTAL, CKMB, CKMBINDEX, TROPONINI in the last 72 hours.   Telemetry    Pt went back into SVT after being paced out yesterday after (personally reviewed)  Radiology    No results found.  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 He followed with EP for significant PVC burden (20%) with an EF of 30-35% and accelerated junctional rhythm - status post medtronic ICD.  Assessment & Plan    1. SVT Continue amiodarione. BP has not tolerated IV after multiple attempts to restart. Lucilla Petrenko transition  to po.  Increase Lopressor to 25 mg BID.   2. Acute on chronic systolic CHF Remains at least moderately volume overloaded. Continue IV lasix at least this am BMET pending.  Consolidate Lopressor to Toprol at discharge.   3. PAF On coumadin for CHA2DS2VASC of at least 7 (h/o CVA per chart)  4. AKI Cr 1.7 -> 1.9 -> 2.1 (Lasix stopped, given 500 mL back) -> 2.3.  He still appears volume overloaded on exam. ? If he would benefit from [employer] vs PICC line for CVP/Coox monitoring. Otniel Hoe discuss with Dr. Oval Linsey   For questions or updates, please contact [facility] Please consult [email or web address] for contact info under Cardiology/STEMI.  Signed, Shirley Friar, PA-C  08/29/2020, 8:53 AM   I have seen and  examined this patient with [name].  Agree with above, note added to reflect my findings.  On exam, tachycardic, regular, lungs clear, JVD. 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 No other arrhythmias noted. Would continue amiodarone. Had RHC which showed significant volume overload and constrictive physiology. Moved to ICU for further diuresis.    Araly Kaas M. Quamir Willemsen MD 08/30/2020 7:43 AM

## 2020-08-29 NOTE — Plan of Care (Signed)
  Problem: Education: Goal: Knowledge of General Education information will improve Description: Including pain rating scale, medication(s)/side effects and non-pharmacologic comfort measures Outcome: Progressing   Problem: Clinical Measurements: Goal: Ability to maintain clinical measurements within normal limits will improve Outcome: Progressing   Problem: Nutrition: Goal: Adequate nutrition will be maintained Outcome: Progressing   Problem: Skin Integrity: Goal: Risk for impaired skin integrity will decrease Outcome: Progressing   Problem: Cardiac: Goal: Ability to achieve and maintain adequate cardiopulmonary perfusion will improve Outcome: Progressing

## 2020-08-29 NOTE — Progress Notes (Signed)
2h08 called for report; RN unable to received report at this time. Will call back in 5 minutes.

## 2020-08-29 NOTE — Progress Notes (Signed)
0119: Paged on call MD for trending down bps before starting new bag of amio drip, latest was 91/61.   0136: Pt call out for difficulty breathing. On assessment pt is not tachypneic and o2 at room air is 100% but is still saying its hard to breathe. Recheck bp systolic 44/17 MAP 66. Placed pt on 1L of o2 for comfort and raised HOB to 45. Pt states some relief. MD paged. BP recheck after interventions 94/65 MAP 74. Amio drip paused

## 2020-08-29 NOTE — Interval H&P Note (Signed)
History and Physical Interval Note:  08/29/2020 11:27 AM  Ryan [name]  has presented today for surgery, with the diagnosis of heart failure.  The various methods of treatment have been discussed with the patient and family. After consideration of risks, benefits and other options for treatment, the patient has consented to  Procedure(s): RIGHT HEART CATH (N/A) as a surgical intervention.  The patient's history has been reviewed, patient examined, no change in status, stable for surgery.  I have reviewed the patient's chart and labs.  Questions were answered to the patient's satisfaction.     Bennetta Rudden [employer]

## 2020-08-29 NOTE — Consult Note (Addendum)
Advanced Heart Failure Team Consult Note   Primary Physician: [name], MD PCP-Cardiologist:  [name], MD  Reason for Consultation: Biventricular Heart Failure   HPI:    Ryan [name] is seen today for evaluation of acute biventricular heart failure at the request of Dr. [name], Electrophysiology.   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   Notes indicate that he has recently struggled to tolerate GDMT due to low BP.   He was admitted 08/25/20 from cardiology clinic for a/c systolic HF w/ marked fluid overload and NYHA Class III symptoms. BNP 2,235. CXR showed small bilateral pleural effusions. He has developed worsening renal function w/ attempts at diuresis. SCr 1.76 on admit w/ progressive rise, now at 2.36 today.   Echo shows severe biventricular dysfunction. LVEF 25-30% w/ global HK (previously 30-35%). RV severely enlarged w/ severe systolic dysfunction. PA pressure moderately elevated 49 mmHg. MV prosthesis well seated. No significant MR/MS. Severe LV dyssynchrony is present.   Hospitalization also notable for AVNRT. EP following for possible ablation.   He is feeling slightly better today but looks fatigued. No resting dyspnea.    Echo 08/26/20 1. There is substantial LV setal-lateral dyssynchrony. Left ventricular ejection fraction, by estimation, is 25 to 30%. The left ventricle has severely decreased function. The left ventricle demonstrates global hypokinesis. Left ventricular diastolic function could not be evaluated. 2. Right ventricular systolic function is severely reduced. The right ventricular size is severely enlarged. There is moderately elevated pulmonary artery systolic  pressure. 3. Left atrial size was moderately dilated. 4. Right atrial size was severely dilated. 5. The mitral valve has been repaired/replaced. No evidence of mitral valve regurgitation. No evidence of mitral stenosis. The mean mitral valve gradient is 6.1 mmHg with average heart rate of 121 bpm. There is a bioprosthetic valve present in the mitral position. 6. Tricuspid valve regurgitation is moderate. 7. The aortic valve is tricuspid. Aortic valve regurgitation is not visualized. No aortic stenosis is present. 8. There is borderline dilatation of the ascending aorta, measuring 37 mm. 9. The inferior vena cava is dilated in size with <50% respiratory variability, suggesting right atrial pressure of 15 mmHg.  Review of Systems: [y] = yes, [ ]  = no   . General: Weight gain [ ] ; Weight loss [ ] ; Anorexia [ ] ; Fatigue [ Y]; Fever [ ] ; Chills [ ] ; Weakness [ ]   . Cardiac: Chest pain/pressure [ ] ; Resting SOB [ ] ; Exertional SOB [Y ]; Orthopnea [ ] ; Pedal Edema [Y ]; Palpitations [ Y]; Syncope [ ] ; Presyncope [ ] ; Paroxysmal nocturnal dyspnea[ ]   . Pulmonary: Cough [ ] ; Wheezing[ ] ; Hemoptysis[ ] ; Sputum [ ] ; Snoring [ ]   . GI: Vomiting[ ] ; Dysphagia[ ] ; Melena[ ] ; Hematochezia [ ] ; Heartburn[ ] ; Abdominal pain [ ] ; Constipation [ ] ; Diarrhea [ ] ; BRBPR [ ]   . GU: Hematuria[ ] ; Dysuria [ ] ; Nocturia[ ]   . Vascular: Pain in legs with walking [ ] ; Pain in feet with lying flat [ ] ; Non-healing sores [ ] ; Stroke [ ] ; TIA [ ] ; Slurred speech [ ] ;  . Neuro: Headaches[ ] ; Vertigo[ ] ; Seizures[ ] ; Paresthesias[ ] ;Blurred vision [ ] ; Diplopia [ ] ; Vision changes [ ]   . Ortho/Skin:  Arthritis [ ] ; Joint pain [ ] ; Muscle pain [ ] ; Joint swelling [ ] ; Back Pain [ ] ; Rash [ ]   . Psych: Depression[ ] ; Anxiety[ ]   . Heme: Bleeding problems [ ] ; Clotting disorders [ ] ; Anemia [ ]   . Endocrine: Diabetes [ ] ; Thyroid dysfunction[ ]   Home Medications Prior to Admission medications   Medication Sig Start Date  End Date Taking? Authorizing Provider  acetaminophen (TYLENOL) 325 MG tablet Take 650 mg by mouth every 6 (six) hours as needed for mild pain or headache.   Yes [provider]  atorvastatin (LIPITOR) 40 MG tablet Take 1 tablet (40 mg total) by mouth daily at 6 PM. 10/06/12  Yes Timmothy Euler, MD  HYDROcodone-acetaminophen (NORCO/VICODIN) 5-325 MG per tablet Take 1 tablet by mouth 2 (two) times daily as needed for moderate pain.    Yes [provider]  mexiletine (MEXITIL) 250 MG capsule TAKE 1 CAPSULE BY MOUTH 2 TIMES DAILY. Patient taking differently: Take 250 mg by mouth 2 (two) times daily.  05/09/20  Yes Camnitz, Will Hassell Done, MD  Multiple Vitamins-Minerals (CENTRUM SILVER PO) Take 1 tablet by mouth 3 (three) times a week.   Yes [provider]  pantoprazole (PROTONIX) 40 MG tablet Take 40 mg by mouth daily.  11/12/14  Yes [provider]  potassium chloride SA (K-DUR,KLOR-CON) 20 MEQ tablet Take 20 mEq by mouth daily.    Yes [provider]  sacubitril-valsartan (ENTRESTO) 24-26 MG Take 1 tablet by mouth 2 (two) times daily. 07/22/20  Yes Camnitz, Will Hassell Done, MD  tamsulosin (FLOMAX) 0.4 MG CAPS capsule Take 0.4 mg by mouth daily.  07/17/18  Yes [provider]  triamcinolone cream (KENALOG) 0.5 % Apply 1 application topically daily as needed (skin rash).  02/15/14  Yes [provider]  warfarin (COUMADIN) 5 MG tablet Take 5 mg by mouth daily.    Yes [provider]    Past Medical History: Past Medical History:  Diagnosis Date  . AICD (automatic cardioverter/defibrillator) present   . Atrial fibrillation ([place])   . Cardiomyopathy, dilated ([place])   . CHF (congestive heart failure) ([place])   . Diabetes mellitus without complication ([place])   . Endocarditis   . Hypertension   . Peripheral vascular disease ([place])   . PVC's (premature ventricular contractions)   . SVT (supraventricular tachycardia)  long RP     Past  Surgical History: Past Surgical History:  Procedure Laterality Date  . ABDOMINAL AORTAGRAM N/A 10/03/2012   Procedure: ABDOMINAL Maxcine Ham;  Surgeon: [name], MD;  Location: [facility] CATH LAB;  Service: Cardiovascular;  Laterality: N/A;  . CORONARY ANGIOGRAM  09/21/2012   Procedure: CORONARY ANGIOGRAM;  Surgeon: [name], MD;  Location: [facility] CATH LAB;  Service: Cardiovascular;;  . EP IMPLANTABLE DEVICE N/A 06/11/2016   Procedure: ICD Implant;  Surgeon: Will Meredith Leeds, MD;  Location: [place] CV LAB;  Service: Cardiovascular;  Laterality: N/A;  . EXTREMITY WIRE/PIN REMOVAL  09/14/2012   Procedure: REMOVAL K-WIRE/PIN EXTREMITY;  Surgeon: [name], MD;  Location: [place];  Service: Orthopedics;  Laterality: Right;  Right Foot  . I & D EXTREMITY  09/14/2012   Procedure: IRRIGATION AND DEBRIDEMENT EXTREMITY;  Surgeon: Tennis Must, MD;  Location: [place];  Service: Orthopedics;  Laterality: Right;  . INTRAOPERATIVE TRANSESOPHAGEAL ECHOCARDIOGRAM  09/26/2012   Procedure: INTRAOPERATIVE TRANSESOPHAGEAL ECHOCARDIOGRAM;  Surgeon: [name], MD;  Location: [facility] OR;  Service: Open Heart Surgery;  Laterality: N/A;  .  MITRAL VALVE REPLACEMENT  09/26/2012   Procedure: MITRAL VALVE (MV) REPLACEMENT;  Surgeon: [name], MD;  Location: [place] OR;  Service: Open Heart Surgery;  Laterality: N/A;  . RIGHT HEART CATHETERIZATION  09/21/2012   Procedure: RIGHT HEART CATH;  Surgeon: [name], MD;  Location: [facility] CATH LAB;  Service: Cardiovascular;;  . TEE WITHOUT CARDIOVERSION  09/18/2012   Procedure: TRANSESOPHAGEAL ECHOCARDIOGRAM (TEE);  Surgeon: [name], MD;  Location: [facility] ENDOSCOPY;  Service: Cardiovascular;  Laterality: N/A;    Family History: Family History  Problem Relation Age of Onset  . Hypertension Mother   . Hypertension Father     Social History: Social History   Socioeconomic History  . Marital status: Legally Separated    Spouse name: Not on file  . Number of  children: 3  . Years of education: Not on file  . Highest education level: Not on file  Occupational History  . Not on file  Tobacco Use  . Smoking status: Never Smoker  . Smokeless tobacco: Never Used  Vaping Use  . Vaping Use: Never used  Substance and Sexual Activity  . Alcohol use: No    Alcohol/week: 0.0 standard drinks  . Drug use: No  . Sexual activity: Not on file  Other Topics Concern  . Not on file  Social History Narrative   Daughter lives with him.  Retired Advertising account planner   Social Determinants of Radio broadcast assistant Strain: Not on [employer] Insecurity: Not on file  Transportation Needs: Not on file  Physical Activity: Not on file  Stress: Not on file  Social Connections: Not on file    Allergies:  Allergies  Allergen Reactions  . Tuna [Fish Allergy] Nausea And Vomiting    Objective:    Vital Signs:   Temp:  [97.9 F (36.6 C)-98.2 F (36.8 C)] 98.2 F (36.8 C) (12/10 0900) Pulse Rate:  [69-117] 105 (12/10 0900) Resp:  [0-27] 18 (12/10 0900) BP: (83-126)/(56-82) 126/74 (12/10 0900) SpO2:  [94 %-100 %] 100 % (12/10 0357) Weight:  [81.2 kg] 81.2 kg (12/10 0019) Last BM Date: 08/25/20  Weight change: Filed Weights   08/27/20 0600 08/28/20 0400 08/29/20 0019  Weight: 81.2 kg 83 kg 81.2 kg    Intake/Output:   Intake/Output Summary (Last 24 hours) at 08/29/2020 1007 Last data filed at 08/29/2020 0900 Gross per 24 hour  Intake 1463.07 ml  Output 1300 ml  Net 163.07 ml      Physical Exam    General:  Fatigue appearing elderly male. No resp difficulty HEENT: normal Neck: supple. JVP elevated to ear. Carotids 2+ bilat; no bruits. No lymphadenopathy or thyromegaly appreciated. Cor: PMI nondisplaced. Regular rhythm, tachy rate. No rubs, gallops or murmurs. Lungs: decreased BS at the bases Abdomen: soft, nontender, nondistended. No hepatosplenomegaly. No bruits or masses. Good bowel sounds. Extremities: no cyanosis, clubbing, rash,  trace bilateral ankle edema Neuro: alert & orientedx3, cranial nerves grossly intact. moves all 4 extremities w/o difficulty. Affect pleasant   Telemetry   Sinus tach low 100s   EKG    EKG 12/7 Afib w/ RVR 130s and PVCs. No new EKG to review today   Labs   Basic Metabolic Panel: Recent Labs  Lab 08/25/20 1121 08/25/20 1419 08/26/20 0622 08/27/20 0108 08/28/20 0402 08/29/20 0250  NA 144 143  --  140 140 139  K 4.8 4.5  --  4.2 3.8 3.9  CL 113* 116*  --  110 108 107  CO2 18* 18*  --  20* 22 20*  GLUCOSE 132* 110*  --  97 106* 138*  BUN 30* 30*  --  38* 42* 43*  CREATININE 1.76* 1.78*  --  1.91* 2.11* 2.36*  CALCIUM 8.8 9.2  --  8.9 8.6* 8.7*  MG 2.4*  --  2.4  --  2.1  --     Liver Function Tests: No results for input(s): AST, ALT, ALKPHOS, BILITOT, PROT, ALBUMIN in the last 168 hours. No results for input(s): LIPASE, AMYLASE in the last 168 hours. No results for input(s): AMMONIA in the last 168 hours.  CBC: Recent Labs  Lab 08/25/20 1121 08/25/20 1419 08/27/20 0108  WBC 5.1 5.2 5.0  HGB 11.9* 11.5* 11.0*  HCT 37.9 38.0* 33.8*  MCV 76* 75.4* 72.8*  PLT 245 328 119*    Cardiac Enzymes: No results for input(s): CKTOTAL, CKMB, CKMBINDEX, TROPONINI in the last 168 hours.  BNP: BNP (last 3 results) Recent Labs    08/26/20 0622  BNP 2,235.1*    ProBNP (last 3 results) Recent Labs    08/25/20 1121  PROBNP 21,395*     CBG: Recent Labs  Lab 08/26/20 1724 08/27/20 1134 08/27/20 1626 08/27/20 2110 08/28/20 1109  GLUCAP 112* 124* 133* 131* 117*    Coagulation Studies: Recent Labs    08/27/20 0108 08/28/20 0402 08/29/20 0250  LABPROT 21.4* 19.2* 17.6*  INR 1.9* 1.7* 1.5*     Imaging    No results found.   Medications:     Current Medications: . amiodarone  400 mg Oral BID  . atorvastatin  40 mg Oral q1800  . metoprolol tartrate  25 mg Oral BID  . multivitamin with minerals   Oral Once per day on Mon Wed Fri  . pantoprazole   40 mg Oral Daily  . polyethylene glycol  17 g Oral Daily  . tamsulosin  0.4 mg Oral Daily  . warfarin  7.5 mg Oral ONCE-1600  . Warfarin - Pharmacist Dosing Inpatient   Does not apply q1600     Infusions: . amiodarone Stopped (08/29/20 0301)      Assessment/Plan   1. Acute on Chronic Biventricular Dysfunction - NICM, LHC in 2014 showed normal cors - Echo this admit w/ LVEF 25-30% (previously 30-35%), diffuse HK and severe RV failure - NYHA Class III symptoms + rising SCr w/ attempt at diuresis, concerning for low output. SCr up from 1.7>>2.3 today, likely cardiorenal  - suspect likely tachymediated from rapid AFib, PSVT and high burden PVCs  - ? Cardiac amyloid.  - Plan RHC today to assess hemodynamics and volume status - suspect he will need additional IV Lasix. Appears fluid overloaded on exam  - may need inotropic support to aid in diuresis, although may be challenging given tachyrhythmias  - consider cMRI vs PYP scan to check for amyloid. Will d/w with EP to see if ICD is MRI compatible  - GDMT currently limited given AKI and low BP  - unfortunately, would not be a candidate for LVAD given severe RV failure - Has ICD. Echo shows severe dyssynchrony, however QRS <150 ms, thus not candidate for CRT-D upgrade  2. AKI on Stage III CKD - SCr 1.7 on admit - progressive rise w/ attempts at diuresis - 1.76>>1.91>>2.11>>2.36 - suspect cardiorenal/ low output - plan RHC today to assess hemodynamics - many need inotropic support  3. 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 H/o MV Replacement  - s/p tissue MVR for endocarditis in 2014 - stable on echo. No  MR/MS  Length of Stay: [address], PA-C  08/29/2020, 10:07 AM  Advanced Heart Failure Team Pager [phone] (M-F; 7a - 4p)  Please contact [facility] for night-coverage after hours (4p -7a ) and weekends on [email or web address]  Patient seen with PA, agree with the above note.   RHC done today:  RHC Procedural Findings: Hemodynamics (mmHg) RA mean 31 RV 53/21, mean 30 PA 57/33, mean 45 PCWP mean 36 Oxygen saturations: PA 59% AO 98% Cardiac Output (Fick) 4.63  Cardiac Index (Fick) 2.28 Cardiac Output (Thermo) 4.72  Cardiac Index (Thermo) 2.33 PVR 1.9 WU PAPi 1.03  Patient reports dyspnea with any exertion though he is able to lie flat on the cath lab table.  He was in relatively slow SVT during most of cath (100s) but briefly in NSR in 60s.   General: NAD Neck: JVP 16+ cm, no thyromegaly or thyroid nodule.  Lungs: Decreased at bases.  CV: Nondisplaced PMI.  Heart regular S1/S2, +S3, 2/6 HSM LLSB.  1+ ankle edema.    Abdomen: Soft, nontender, no hepatosplenomegaly, no distention.  Skin: Intact without lesions or rashes.  Neurologic: Alert and oriented x 3.  Psych: Normal affect. Extremities: No clubbing or cyanosis.  HEENT: Normal.   Assessment/Plan: 1. 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  Echo from this admission was reviewed, by my interpretation EF 20-25%, moderate RV enlargement with severely decreased RV systolic function, normally functioning bioprosthetic mitral valve with no significant MR and mean gradient 6, moderate TR, dilated IVC.  Patient had normal EF in 12/13 pre-mitral valve replacement.  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  His last cath was in 1/14 pre-surgery, no significant CAD. On RHC today, evidence for severe biventricular failure with PCWP and RA pressure > 30 and PAPi 0.8.  Equalization of diastolic pressures concerning for  restrictive physiology.  Cardiac output preserved (CI 2.28 Fick, 2.33 thermo).   It is possible that cardiomyopathy is at least in part tachycardia-mediated given frequent runs of SVT (though rate now in the 100s generally).  Also has history of frequent PVCs.  Cannot rule out coronary disease though no coronary angiography yet with elevated creatinine.  - Leave Swan in place for now, monitor on 2H.  - Lasix 80 mg IV x 1 then 12 mg/hr.  Will try aggressive diuresis to see if lower renal venous pressure can improve creatinine.  - No inotrope (milrinone) yet, but would consider if Lasix infusion is not successful or if cardiac index drops. This would risk worsening arrhythmias.  - Eventual coronary angiography to assess for CAD as cause of worsened EF if creatinine stabilizes (no ACS so not urgent).  - No ARNI/ARB/spironolactone/digoxin with elevated creatinine.  - No Bidil for now with soft BP, consider if BP stabilizes.  - Consolidate metoprolol to Toprol XL 25 mg daily.  - I am concerned that his prognosis is not going to be good given quite significant RV dysfunction.  2. 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  Patient in  this slow SVT currently, rate in 100s.  Briefly in NSR in 60s in cath lab.  Suspect he will need effective diuresis to be more likely to hold NSR (marked volume overload). ?Component of tachy-mediated cardiomyopathy.  HR not markedly high but he seems to be predominantly in SVT.  - Noted plan from EP for possible ablation in the future. We need to keep in NSR if at all possible given concern for tachy-mediated CMP.  - Amiodarone gtt at 30 mg/hr for now.  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  4. PAD: h/o right popliteal occlusion. Continue atorvastatin.  5. Atrial fibrillation: Paroxysmal.  Have not seen atrial fibrillation this admission. Has history of CVA.  - Continue warfarin.  6. 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   7. AKI on CKD stage 3: Suspect cardiorenal syndrome in setting of RV failure/markedly elevated CVP.  Hopefully will improve with effective diuresis.   Loralie Champagne 08/29/2020 1:31 PM

## 2020-08-30 LAB — MRSA PCR SCREENING: MRSA by PCR: NEGATIVE

## 2020-08-30 LAB — BASIC METABOLIC PANEL
Anion gap: 10 (ref 5–15)
BUN: 36 mg/dL — ABNORMAL HIGH (ref 8–23)
CO2: 25 mmol/L (ref 22–32)
Calcium: 8.6 mg/dL — ABNORMAL LOW (ref 8.9–10.3)
Chloride: 105 mmol/L (ref 98–111)
Creatinine, Ser: 2.09 mg/dL — ABNORMAL HIGH (ref 0.61–1.24)
GFR, Estimated: 32 mL/min — ABNORMAL LOW (ref >60)
Glucose, Bld: 107 mg/dL — ABNORMAL HIGH (ref 70–99)
Potassium: 3.7 mmol/L (ref 3.5–5.1)
Sodium: 140 mmol/L (ref 135–145)

## 2020-08-30 LAB — CBC
HCT: 32.5 % — ABNORMAL LOW (ref 39.0–52.0)
Hemoglobin: 10.6 g/dL — ABNORMAL LOW (ref 13.0–17.0)
MCH: 23.8 pg — ABNORMAL LOW (ref 26.0–34.0)
MCHC: 32.6 g/dL (ref 30.0–36.0)
MCV: 72.9 fL — ABNORMAL LOW (ref 80.0–100.0)
Platelets: 106 10*3/uL — ABNORMAL LOW (ref 150–400)
RBC: 4.46 MIL/uL (ref 4.22–5.81)
RDW: 19 % — ABNORMAL HIGH (ref 11.5–15.5)
WBC: 3.9 10*3/uL — ABNORMAL LOW (ref 4.0–10.5)
nRBC: 0 % (ref 0.0–0.2)

## 2020-08-30 LAB — GLUCOSE, CAPILLARY
Glucose-Capillary: 93 mg/dL (ref 70–99)
Glucose-Capillary: 134 mg/dL — ABNORMAL HIGH (ref 70–99)
Glucose-Capillary: 102 mg/dL — ABNORMAL HIGH (ref 70–99)

## 2020-08-30 LAB — COOXEMETRY PANEL
Carboxyhemoglobin: 1 % (ref 0.5–1.5)
Methemoglobin: 1 % (ref 0.0–1.5)
O2 Saturation: 58 %
Total hemoglobin: 10.2 g/dL — ABNORMAL LOW (ref 12.0–16.0)

## 2020-08-30 LAB — PROTIME-INR
INR: 2 — ABNORMAL HIGH (ref 0.8–1.2)
Prothrombin Time: 21.6 seconds — ABNORMAL HIGH (ref 11.4–15.2)

## 2020-08-30 MED ORDER — POTASSIUM CHLORIDE CRYS ER 20 MEQ PO TBCR
40.0000 meq | EXTENDED_RELEASE_TABLET | Freq: Once | ORAL | Status: AC
  Administered 2020-08-30: 40 meq via ORAL
  Filled 2020-08-30: qty 2

## 2020-08-30 MED ORDER — METOPROLOL SUCCINATE ER 25 MG PO TB24
25.0000 mg | ORAL_TABLET | Freq: Every day | ORAL | Status: DC
  Administered 2020-08-30 – 2020-09-02 (×4): 25 mg via ORAL
  Filled 2020-08-30 (×4): qty 1

## 2020-08-30 MED ORDER — WARFARIN SODIUM 2 MG PO TABS
4.0000 mg | ORAL_TABLET | Freq: Once | ORAL | Status: AC
  Administered 2020-08-30: 4 mg via ORAL
  Filled 2020-08-30: qty 2

## 2020-08-30 MED ORDER — METOLAZONE 2.5 MG PO TABS
2.5000 mg | ORAL_TABLET | Freq: Once | ORAL | Status: AC
  Administered 2020-08-30: 2.5 mg via ORAL
  Filled 2020-08-30: qty 1

## 2020-08-30 NOTE — Progress Notes (Signed)
Patient ID: Ryan [name], male   DOB: 12-Apr-1946, 74 y.o.   MRN: [id number]     Advanced Heart Failure Rounding Note  PCP-Cardiologist: [name] III, MD   Subjective:    Good diuresis overnight, weight down. Creatinine lower 2.36 => 2.09.  He remains on Lasix gtt 12 mg/hr.    He is in NSR with PVCs currently, on amiodarone gtt.   No complaints.   Swan numbers CVP 16 PA 33/24 CI 2.38 Co-ox 58%  RHC Procedural Findings: Hemodynamics (mmHg) RA mean 31 RV 53/21, mean 30 PA 57/33, mean 45 PCWP mean 36 Oxygen saturations: PA 59% AO 98% Cardiac Output (Fick) 4.63  Cardiac Index (Fick) 2.28 Cardiac Output (Thermo) 4.72  Cardiac Index (Thermo) 2.33 PVR 1.9 WU PAPi 0.8  Objective:   Weight Range: 78 kg Body mass index is 23.32 kg/m.   Vital Signs:   Temp:  [98.1 F (36.7 C)-99.14 F (37.3 C)] 98.1 F (36.7 C) (12/11 0746) Pulse Rate:  [0-112] 101 (12/11 0600) Resp:  [0-27] 9 (12/11 0600) BP: (86-219)/(60-109) 98/74 (12/11 0600) SpO2:  [0 %-100 %] 100 % (12/11 0600) Weight:  [78 kg] 78 kg (12/11 0500) Last BM Date: 08/29/20  Weight change: Filed Weights   08/28/20 0400 08/29/20 0019 08/30/20 0500  Weight: 83 kg 81.2 kg 78 kg    Intake/Output:   Intake/Output Summary (Last 24 hours) at 08/30/2020 0835 Last data filed at 08/30/2020 0600 Gross per 24 hour  Intake 585.12 ml  Output 2815 ml  Net -2229.88 ml      Physical Exam    General:  Well appearing. No resp difficulty HEENT: Normal Neck: Supple. JVP 14-16 cm. Carotids 2+ bilat; no bruits. No lymphadenopathy or thyromegaly appreciated. Cor: PMI nondisplaced. Regular rate & rhythm. No rubs, gallops or murmurs. Lungs: Clear Abdomen: Soft, nontender, nondistended. No hepatosplenomegaly. No bruits or masses. Good bowel sounds. Extremities: No cyanosis, clubbing, rash, edema Neuro: Alert & orientedx3, cranial nerves grossly intact. moves all 4 extremities w/o difficulty. Affect  pleasant   Telemetry   NSR with PVCs, had runs of slow SVT overnight (personally reviewed).    Labs    CBC Recent Labs    08/29/20 1156 08/30/20 0432  WBC  --  3.9*  HGB 11.9* 10.6*  HCT 35.0* 32.5*  MCV  --  72.9*  PLT  --  629*   Basic Metabolic Panel Recent Labs    08/28/20 0402 08/29/20 0250 08/29/20 1155 08/29/20 1156 08/30/20 0432  NA 140 139   < > 141 140  K 3.8 3.9   < > 3.6 3.7  CL 108 107  --   --  105  CO2 22 20*  --   --  25  GLUCOSE 106* 138*  --   --  107*  BUN 42* 43*  --   --  36*  CREATININE 2.11* 2.36*  --   --  2.09*  CALCIUM 8.6* 8.7*  --   --  8.6*  MG 2.1  --   --   --   --    < > = values in this interval not displayed.   Liver Function Tests No results for input(s): AST, ALT, ALKPHOS, BILITOT, PROT, ALBUMIN in the last 72 hours. No results for input(s): LIPASE, AMYLASE in the last 72 hours. Cardiac Enzymes No results for input(s): CKTOTAL, CKMB, CKMBINDEX, TROPONINI in the last 72 hours.  BNP: BNP (last 3 results) Recent Labs    08/26/20 0622  BNP 2,235.1*    ProBNP (last 3 results) Recent Labs    08/25/20 1121  PROBNP 21,395*     D-Dimer No results for input(s): DDIMER in the last 72 hours. Hemoglobin A1C No results for input(s): HGBA1C in the last 72 hours. Fasting Lipid Panel No results for input(s): CHOL, HDL, LDLCALC, TRIG, CHOLHDL, LDLDIRECT in the last 72 hours. Thyroid Function Tests No results for input(s): TSH, T4TOTAL, T3FREE, THYROIDAB in the last 72 hours.  Invalid input(s): FREET3  Other results:   Imaging    CARDIAC CATHETERIZATION  Result Date: 08/29/2020 1. Severely elevated right and left heart filling pressures. 2. Near equalization of diastolic pressures, restrictive physiology. 3. Cardiac output relatively preserved. This is suggestive of decompensated biventricular failure.  Will give IV Lasix and start gtt.  With arrhythmias, would avoid milrinone for now with relatively preserved cardiac  output.  Hopefully renal function will improve with some diuresis and decrease in renal venous pressure. Will leave Swan in place, if diureses poorly or cardiac output is registered as lower, would use milrinone.      Medications:     Scheduled Medications: . atorvastatin  40 mg Oral q1800  . Chlorhexidine Gluconate Cloth  6 each Topical Daily  . metolazone  2.5 mg Oral Once  . metoprolol tartrate  25 mg Oral BID  . midodrine  2.5 mg Oral BID WC  . multivitamin with minerals   Oral Once per day on Mon Wed Fri  . pantoprazole  40 mg Oral Daily  . polyethylene glycol  17 g Oral Daily  . potassium chloride  40 mEq Oral Once  . sodium chloride flush  3 mL Intravenous Q12H  . tamsulosin  0.4 mg Oral Daily  . Warfarin - Pharmacist Dosing Inpatient   Does not apply q1600     Infusions: . amiodarone 30 mg/hr (08/30/20 0600)  . furosemide (LASIX) 200 mg in dextrose 5% 100 mL (2mg /mL) infusion 12 mg/hr (08/30/20 0730)     PRN Medications:  acetaminophen, ondansetron (ZOFRAN) IV, senna-docusate   Assessment/Plan   1. 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  Echo from this admission was reviewed, by my interpretation EF 20-25%, moderate RV enlargement with severely decreased RV systolic function, normally functioning bioprosthetic mitral valve with no significant MR and mean gradient 6, moderate TR, dilated IVC.  Patient had normal EF in 12/13 pre-mitral valve replacement.  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  His last cath was in 1/14 pre-surgery, no significant CAD. On [place] 12/10, evidence for severe biventricular failure with PCWP and RA pressure > 30 and PAPi 0.8.  Equalization of diastolic pressures concerning for restrictive physiology.  Cardiac output preserved (CI 2.28 Fick, 2.33 thermo).   It is possible that cardiomyopathy is at least in part tachycardia-mediated  given frequent runs of SVT (though rate is relatively slow, 110s for the most part).  Also has history of frequent PVCs.  Cannot rule out coronary disease though no coronary angiography yet with elevated creatinine.  He was started on Lasix gtt, diuresed well overnight with weight down.  CVP remains 15-16 with CI 2.38 and co-ox 58% this morning.  - Continue Lasix 12 mg/hr and will give metolazone 2.5 x 1.  Will try aggressive diuresis to see if lower renal venous pressure can improve creatinine.  - No inotrope (milrinone) yet, but would consider if Lasix  infusion is not successful or if cardiac index drops. This would risk worsening arrhythmias.  - Eventual coronary angiography to assess for CAD as cause of worsened EF if creatinine stabilizes (no ACS so not urgent).  - No ARNI/ARB/spironolactone/digoxin with elevated creatinine.  - No Bidil for now with soft BP, consider if BP stabilizes.  - Consolidate metoprolol to Toprol XL 25 mg daily.  - I am concerned that his prognosis is not going to be good given quite significant RV dysfunction.  2. 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  Currently back in NSR with PVCs on amiodarone gtt.  Suspect he will need effective diuresis to hold NSR (marked volume overload). ?Component of tachy-mediated cardiomyopathy.  HR not markedly high when in SVT but he seems to have been predominantly in SVT.  - Hopefully ablation by EP next week when diuresed. We need to keep in NSR if at all possible given concern for tachy-mediated CMP.  - Amiodarone gtt at 30 mg/hr for now.  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 This may play a role in biventricular failure.  4. PAD: h/o right popliteal occlusion. Continue atorvastatin.  5. Atrial fibrillation: Paroxysmal.  Have not seen atrial fibrillation this admission. Has history of CVA.  - Continue warfarin.  6. 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  7. AKI on CKD stage 3:  Suspect cardiorenal syndrome in setting of RV failure/markedly elevated CVP.  Hopefully will improve with effective diuresis => creatinine lower today.   CRITICAL CARE Performed by: Loralie Champagne  Total critical care time: 35 minutes  Critical care time was exclusive of separately billable procedures and treating other patients.  Critical care was necessary to treat or prevent imminent or life-threatening deterioration.  Critical care was time spent personally by me on the following activities: development of treatment plan with patient and/or surrogate as well as nursing, discussions with consultants, evaluation of patient's response to treatment, examination of patient, obtaining history from patient or surrogate, ordering and performing treatments and interventions, ordering and review of laboratory studies, ordering and review of radiographic studies, pulse oximetry and re-evaluation of patient's condition. .   Length of Stay: [place], MD  08/30/2020, 8:35 AM  Advanced Heart Failure Team Pager [phone] (M-F; 7a - 4p)  Please contact [facility] for night-coverage after hours (4p -7a ) and weekends on [email or web address]

## 2020-08-30 NOTE — Plan of Care (Signed)

## 2020-08-30 NOTE — Progress Notes (Signed)
ANTICOAGULATION CONSULT NOTE - Follow Up Consult  Pharmacy Consult for Coumadin Indication: atrial fibrillation  Allergies  Allergen Reactions  . Tuna [Fish Allergy] Nausea And Vomiting    Patient Measurements: Height: 6' (182.9 cm) Weight: 78 kg (171 lb 15.3 oz) IBW/kg (Calculated) : 77.6  Vital Signs: Temp: 98 F (36.7 C) (12/11 1100) Temp Source: Core (Comment) (12/11 0800) BP: 104/63 (12/11 1053) Pulse Rate: 105 (12/11 1053)  Labs: Recent Labs    08/28/20 0402 08/29/20 0250 08/29/20 1155 08/29/20 1155 08/29/20 1156 08/30/20 0432  HGB  --   --  11.6*   < > 11.9* 10.6*  HCT  --   --  34.0*  --  35.0* 32.5*  PLT  --   --   --   --   --  106*  LABPROT 19.2* 17.6*  --   --   --  21.6*  INR 1.7* 1.5*  --   --   --  2.0*  CREATININE 2.11* 2.36*  --   --   --  2.09*   < > = values in this interval not displayed.    Estimated Creatinine Clearance: 33 mL/min (A) (by C-G formula based on SCr of 2.09 mg/dL (H)).   Medical History: Past Medical History:  Diagnosis Date  . AICD (automatic cardioverter/defibrillator) present   . Atrial fibrillation ([place])   . 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 CHF (congestive heart failure) ([place])   . Diabetes mellitus without complication ([place])   . Endocarditis   . Hypertension   . Peripheral vascular disease ([place])   . PVC's (premature ventricular contractions)   . SVT (supraventricular tachycardia)  long RP     Assessment: 74 yo male presented on 08/25/2020 as a direct admission from EP clinic for significant SOB with minimal exertion, PND, and orthopnea. Patient is s/p bioprosthetic mitral valve repair in 2014. Patient is on warfarin prior to admission for Afib. INR 1.9 on admission.  Today, INR up to 2.0 therapeutic. Patient continues on amiodarone infusion which can potentiate warfarin effects.   Prior to admission warfarin regimen: warfarin 5mg  daily   Goal of Therapy:  INR 2-3 Monitor platelets by anticoagulation protocol:  Yes   Plan:  Warfarin 4mg  x1 tonight  Monitor INR, CBC and S/S of bleeding daily   Erin Hearing PharmD., BCPS Clinical Pharmacist 08/30/2020 1:58 PM

## 2020-08-31 LAB — GLUCOSE, CAPILLARY
Glucose-Capillary: 164 mg/dL — ABNORMAL HIGH (ref 70–99)
Glucose-Capillary: 125 mg/dL — ABNORMAL HIGH (ref 70–99)
Glucose-Capillary: 105 mg/dL — ABNORMAL HIGH (ref 70–99)
Glucose-Capillary: 119 mg/dL — ABNORMAL HIGH (ref 70–99)
Glucose-Capillary: 116 mg/dL — ABNORMAL HIGH (ref 70–99)

## 2020-08-31 LAB — COOXEMETRY PANEL
Carboxyhemoglobin: 1.2 % (ref 0.5–1.5)
Methemoglobin: 1.1 % (ref 0.0–1.5)
O2 Saturation: 70.4 %
Total hemoglobin: 11.3 g/dL — ABNORMAL LOW (ref 12.0–16.0)

## 2020-08-31 LAB — BASIC METABOLIC PANEL
Anion gap: 10 (ref 5–15)
BUN: 36 mg/dL — ABNORMAL HIGH (ref 8–23)
CO2: 28 mmol/L (ref 22–32)
Calcium: 9.1 mg/dL (ref 8.9–10.3)
Chloride: 101 mmol/L (ref 98–111)
Creatinine, Ser: 2.24 mg/dL — ABNORMAL HIGH (ref 0.61–1.24)
GFR, Estimated: 30 mL/min — ABNORMAL LOW (ref >60)
Glucose, Bld: 121 mg/dL — ABNORMAL HIGH (ref 70–99)
Potassium: 3.6 mmol/L (ref 3.5–5.1)
Sodium: 139 mmol/L (ref 135–145)

## 2020-08-31 LAB — CBC
HCT: 34.3 % — ABNORMAL LOW (ref 39.0–52.0)
Hemoglobin: 11.3 g/dL — ABNORMAL LOW (ref 13.0–17.0)
MCH: 23.7 pg — ABNORMAL LOW (ref 26.0–34.0)
MCHC: 32.9 g/dL (ref 30.0–36.0)
MCV: 72.1 fL — ABNORMAL LOW (ref 80.0–100.0)
Platelets: 103 10*3/uL — ABNORMAL LOW (ref 150–400)
RBC: 4.76 MIL/uL (ref 4.22–5.81)
RDW: 18.6 % — ABNORMAL HIGH (ref 11.5–15.5)
WBC: 6.3 10*3/uL (ref 4.0–10.5)
nRBC: 0 % (ref 0.0–0.2)

## 2020-08-31 LAB — MAGNESIUM: Magnesium: 1.9 mg/dL (ref 1.7–2.4)

## 2020-08-31 LAB — PROTIME-INR
INR: 2.2 — ABNORMAL HIGH (ref 0.8–1.2)
Prothrombin Time: 23.4 seconds — ABNORMAL HIGH (ref 11.4–15.2)

## 2020-08-31 MED ORDER — SODIUM CHLORIDE 0.9% FLUSH
10.0000 mL | Freq: Two times a day (BID) | INTRAVENOUS | Status: DC
  Administered 2020-09-01 – 2020-09-04 (×3): 10 mL

## 2020-08-31 MED ORDER — WARFARIN SODIUM 5 MG PO TABS
5.0000 mg | ORAL_TABLET | Freq: Once | ORAL | Status: AC
  Administered 2020-08-31: 5 mg via ORAL
  Filled 2020-08-31: qty 1

## 2020-08-31 MED ORDER — MILRINONE LACTATE IN DEXTROSE 20-5 MG/100ML-% IV SOLN
0.1250 ug/kg/min | INTRAVENOUS | Status: DC
  Administered 2020-08-31: 0.125 ug/kg/min via INTRAVENOUS
  Administered 2020-09-01 (×3): 0.25 ug/kg/min via INTRAVENOUS
  Administered 2020-09-03 – 2020-09-04 (×2): 0.125 ug/kg/min via INTRAVENOUS
  Filled 2020-08-31 (×5): qty 100

## 2020-08-31 MED ORDER — POTASSIUM CHLORIDE CRYS ER 20 MEQ PO TBCR
40.0000 meq | EXTENDED_RELEASE_TABLET | Freq: Once | ORAL | Status: AC
  Administered 2020-08-31: 40 meq via ORAL
  Filled 2020-08-31: qty 2

## 2020-08-31 MED ORDER — SODIUM CHLORIDE 0.9% FLUSH: 10.0000 mL | INTRAVENOUS | Status: DC | PRN

## 2020-08-31 NOTE — Plan of Care (Signed)

## 2020-08-31 NOTE — Progress Notes (Signed)
Patient ID: Ryan [name], male   DOB: April 08, 1946, 74 y.o.   MRN: [id number]     Advanced Heart Failure Rounding Note  PCP-Cardiologist: [name] III, MD   Subjective:    Good diuresis overnight, weight down. Creatinine lower 2.36 => 2.09.  He remains on Lasix gtt 12 mg/hr.    He is in SVT in the 100s, on amiodarone gtt.   No complaints.   Swan numbers CVP 9-10 PA 49/31 CI 1.75 Co-ox 70%  RHC Procedural Findings: Hemodynamics (mmHg) RA mean 31 RV 53/21, mean 30 PA 57/33, mean 45 PCWP mean 36 Oxygen saturations: PA 59% AO 98% Cardiac Output (Fick) 4.63  Cardiac Index (Fick) 2.28 Cardiac Output (Thermo) 4.72  Cardiac Index (Thermo) 2.33 PVR 1.9 WU PAPi 0.8  Objective:   Weight Range: 76 kg Body mass index is 22.72 kg/m.   Vital Signs:   Temp:  [98 F (36.7 C)-99.9 F (37.7 C)] 98.96 F (37.2 C) (12/12 0800) Pulse Rate:  [37-111] 66 (12/12 0800) Resp:  [8-36] 16 (12/12 0800) BP: (78-115)/(58-84) 94/69 (12/12 0800) SpO2:  [86 %-100 %] 98 % (12/12 0800) Weight:  [76 kg] 76 kg (12/12 0500) Last BM Date: 08/29/20  Weight change: Filed Weights   08/29/20 0019 08/30/20 0500 08/31/20 0500  Weight: 81.2 kg 78 kg 76 kg    Intake/Output:   Intake/Output Summary (Last 24 hours) at 08/31/2020 0813 Last data filed at 08/31/2020 0630 Gross per 24 hour  Intake 1040.9 ml  Output 4490 ml  Net -3449.1 ml      Physical Exam    General: NAD Neck: JVP 10-12 cm, no thyromegaly or thyroid nodule.  Lungs: Clear to auscultation bilaterally with normal respiratory effort. CV: Nondisplaced PMI.  Heart mildly tachy, regular S1/S2, no S3/S4, no murmur.  No peripheral edema.   Abdomen: Soft, nontender, no hepatosplenomegaly, no distention.  Skin: Intact without lesions or rashes.  Neurologic: Alert and oriented x 3.  Psych: Normal affect. Extremities: No clubbing or cyanosis.  HEENT: Normal.     Telemetry   Relatively slow SVT 100s (personally reviewed).     Labs    CBC Recent Labs    08/30/20 0432 08/31/20 0329  WBC 3.9* 6.3  HGB 10.6* 11.3*  HCT 32.5* 34.3*  MCV 72.9* 72.1*  PLT 106* 947*   Basic Metabolic Panel Recent Labs    08/30/20 0432 08/31/20 0329  NA 140 139  K 3.7 3.6  CL 105 101  CO2 25 28  GLUCOSE 107* 121*  BUN 36* 36*  CREATININE 2.09* 2.24*  CALCIUM 8.6* 9.1  MG  --  1.9   Liver Function Tests No results for input(s): AST, ALT, ALKPHOS, BILITOT, PROT, ALBUMIN in the last 72 hours. No results for input(s): LIPASE, AMYLASE in the last 72 hours. Cardiac Enzymes No results for input(s): CKTOTAL, CKMB, CKMBINDEX, TROPONINI in the last 72 hours.  BNP: BNP (last 3 results) Recent Labs    08/26/20 0622  BNP 2,235.1*    ProBNP (last 3 results) Recent Labs    08/25/20 1121  PROBNP 21,395*     D-Dimer No results for input(s): DDIMER in the last 72 hours. Hemoglobin A1C No results for input(s): HGBA1C in the last 72 hours. Fasting Lipid Panel No results for input(s): CHOL, HDL, LDLCALC, TRIG, CHOLHDL, LDLDIRECT in the last 72 hours. Thyroid Function Tests No results for input(s): TSH, T4TOTAL, T3FREE, THYROIDAB in the last 72 hours.  Invalid input(s): FREET3  Other results:  Imaging    No results found.   Medications:     Scheduled Medications: . atorvastatin  40 mg Oral q1800  . Chlorhexidine Gluconate Cloth  6 each Topical Daily  . metoprolol succinate  25 mg Oral Daily  . midodrine  2.5 mg Oral BID WC  . multivitamin with minerals   Oral Once per day on Mon Wed Fri  . pantoprazole  40 mg Oral Daily  . polyethylene glycol  17 g Oral Daily  . potassium chloride  40 mEq Oral Once  . sodium chloride flush  3 mL Intravenous Q12H  . tamsulosin  0.4 mg Oral Daily  . Warfarin - Pharmacist Dosing Inpatient   Does not apply q1600    Infusions: . amiodarone 30 mg/hr (08/31/20 0600)  . furosemide (LASIX) 200 mg in dextrose 5% 100 mL (2mg /mL) infusion 12 mg/hr (08/31/20 0600)  .  milrinone      PRN Medications: acetaminophen, ondansetron (ZOFRAN) IV, senna-docusate   Assessment/Plan   1. 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  Echo from this admission was reviewed, by my interpretation EF 20-25%, moderate RV enlargement with severely decreased RV systolic function, normally functioning bioprosthetic mitral valve with no significant MR and mean gradient 6, moderate TR, dilated IVC.  Patient had normal EF in 12/13 pre-mitral valve replacement.  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  His last cath was in 1/14 pre-surgery, no significant CAD. On [place] 12/10, evidence for severe biventricular failure with PCWP and RA pressure > 30 and PAPi 0.8.  Equalization of diastolic pressures concerning for restrictive physiology.  Cardiac output preserved (CI 2.28 Fick, 2.33 thermo).   It is possible that cardiomyopathy is at least in part tachycardia-mediated given frequent runs of SVT (though rate is relatively slow, 110s for the most part).  Also has history of frequent PVCs.  Cannot rule out coronary disease though no coronary angiography yet with elevated creatinine.  He was started on Lasix gtt and got metolazone yesterday, diuresed well overnight with weight down.  CVP 9-10 today with PADP still high at 31.  Creatinine mildly higher at 2.24 with CI 1.75 (though co-ox good at 70%).  Would like at least 1 more day of aggressive diuresis with elevate PADP.  - Continue Lasix 12 mg/hr and will give metolazone 2.5 x 1 again.   - SVT has been stable in 100s, with low CI and higher creatinine this morning will start low dose milrinone 0.125 and follow HR closely.  - Eventual coronary angiography to assess for CAD as cause of worsened EF if creatinine stabilizes (no ACS so not urgent).  - No ARNI/ARB/spironolactone/digoxin with elevated creatinine.  - No Bidil for now with soft  BP, consider if BP stabilizes. He has been on midodrine 2.5 bid, SBP in 90s-100s.  - Continue Toprol XL 25 mg daily.  - I am concerned that his prognosis is not going to be good given quite significant RV dysfunction.  2. 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  Currently back in NSR with PVCs on amiodarone gtt.  Suspect he will need effective diuresis to hold NSR (marked volume overload). ?Component of tachy-mediated cardiomyopathy.  HR not markedly high when in SVT but he seems to have been predominantly in SVT.  - Hopefully ablation by EP next week when diuresed. We need to keep in NSR if at all possible given concern for  tachy-mediated CMP.  - Amiodarone gtt at 30 mg/hr for now.  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 This may play a role in biventricular failure.  - Watch carefully with milrinone.  4. PAD: h/o right popliteal occlusion. Continue atorvastatin.  5. Atrial fibrillation: Paroxysmal.  Have not seen atrial fibrillation this admission. Has history of CVA.  - Continue warfarin.  6. 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  7. AKI on CKD stage 3: Suspect cardiorenal syndrome in setting of RV failure/markedly elevated CVP.  Hopefully will improve with effective diuresis, will also add low dose milrinone today with marginal cardiac output.   CRITICAL CARE Performed by: Loralie Champagne  Total critical care time: 35 minutes  Critical care time was exclusive of separately billable procedures and treating other patients.  Critical care was necessary to treat or prevent imminent or life-threatening deterioration.  Critical care was time spent personally by me on the following activities: development of treatment plan with patient and/or surrogate as well as nursing, discussions with consultants, evaluation of patient's response to treatment, examination of patient, obtaining history from patient or surrogate, ordering and performing  treatments and interventions, ordering and review of laboratory studies, ordering and review of radiographic studies, pulse oximetry and re-evaluation of patient's condition. .   Length of Stay: [place], MD  08/31/2020, 8:13 AM  Advanced Heart Failure Team Pager [phone] (M-F; 7a - 4p)  Please contact [facility] for night-coverage after hours (4p -7a ) and weekends on [email or web address]

## 2020-08-31 NOTE — Progress Notes (Signed)
ANTICOAGULATION CONSULT NOTE - Follow Up Consult  Pharmacy Consult for Coumadin Indication: atrial fibrillation  Allergies  Allergen Reactions  . Tuna [Fish Allergy] Nausea And Vomiting    Patient Measurements: Height: 6' (182.9 cm) Weight: 76 kg (167 lb 8.8 oz) IBW/kg (Calculated) : 77.6  Vital Signs: Temp: 98.6 F (37 C) (12/12 1300) Temp Source: Oral (12/12 0800) BP: 91/60 (12/12 1300) Pulse Rate: 110 (12/12 1300)  Labs: Recent Labs    08/29/20 0250 08/29/20 1155 08/29/20 1156 08/30/20 0432 08/31/20 0329  HGB  --    < > 11.9* 10.6* 11.3*  HCT  --    < > 35.0* 32.5* 34.3*  PLT  --   --   --  106* 103*  LABPROT 17.6*  --   --  21.6* 23.4*  INR 1.5*  --   --  2.0* 2.2*  CREATININE 2.36*  --   --  2.09* 2.24*   < > = values in this interval not displayed.    Estimated Creatinine Clearance: 30.2 mL/min (A) (by C-G formula based on SCr of 2.24 mg/dL (H)).   Medical History: Past Medical History:  Diagnosis Date  . AICD (automatic cardioverter/defibrillator) present   . Atrial fibrillation ([place])   . 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 CHF (congestive heart failure) ([place])   . Diabetes mellitus without complication ([place])   . Endocarditis   . Hypertension   . Peripheral vascular disease ([place])   . PVC's (premature ventricular contractions)   . SVT (supraventricular tachycardia)  long RP     Assessment: 74 yo male presented on 08/25/2020 as a direct admission from EP clinic for significant SOB with minimal exertion, PND, and orthopnea. Patient is s/p bioprosthetic mitral valve repair in 2014. Patient is on warfarin prior to admission for Afib. INR 1.9 on admission.  INR up to 2.2. Patient continues on amiodarone infusion which can potentiate warfarin effects. CBC stable, no bleeding noted.   Prior to admission warfarin regimen: warfarin 5mg  daily   Goal of Therapy:  INR 2-3 Monitor platelets by anticoagulation protocol: Yes   Plan:  Warfarin 5mg  x1 tonight   Monitor INR, CBC and S/S of bleeding daily   Erin Hearing PharmD., BCPS Clinical Pharmacist 08/31/2020 2:35 PM

## 2020-09-01 ENCOUNTER — Encounter (HOSPITAL_COMMUNITY): Payer: Self-pay | Admitting: Anesthesiology

## 2020-09-01 ENCOUNTER — Inpatient Hospital Stay (HOSPITAL_COMMUNITY): Payer: Medicare HMO

## 2020-09-01 ENCOUNTER — Inpatient Hospital Stay: Payer: Self-pay

## 2020-09-01 LAB — BASIC METABOLIC PANEL
Anion gap: 11 (ref 5–15)
BUN: 34 mg/dL — ABNORMAL HIGH (ref 8–23)
CO2: 31 mmol/L (ref 22–32)
Calcium: 8.8 mg/dL — ABNORMAL LOW (ref 8.9–10.3)
Chloride: 93 mmol/L — ABNORMAL LOW (ref 98–111)
Creatinine, Ser: 2.12 mg/dL — ABNORMAL HIGH (ref 0.61–1.24)
GFR, Estimated: 32 mL/min — ABNORMAL LOW (ref >60)
Glucose, Bld: 133 mg/dL — ABNORMAL HIGH (ref 70–99)
Potassium: 3.2 mmol/L — ABNORMAL LOW (ref 3.5–5.1)
Sodium: 135 mmol/L (ref 135–145)

## 2020-09-01 LAB — CBC
HCT: 35.4 % — ABNORMAL LOW (ref 39.0–52.0)
Hemoglobin: 11.7 g/dL — ABNORMAL LOW (ref 13.0–17.0)
MCH: 23.5 pg — ABNORMAL LOW (ref 26.0–34.0)
MCHC: 33.1 g/dL (ref 30.0–36.0)
MCV: 71.1 fL — ABNORMAL LOW (ref 80.0–100.0)
Platelets: 106 10*3/uL — ABNORMAL LOW (ref 150–400)
RBC: 4.98 MIL/uL (ref 4.22–5.81)
RDW: 18.5 % — ABNORMAL HIGH (ref 11.5–15.5)
WBC: 6.1 10*3/uL (ref 4.0–10.5)
nRBC: 0 % (ref 0.0–0.2)

## 2020-09-01 LAB — COOXEMETRY PANEL
Carboxyhemoglobin: 1.6 % — ABNORMAL HIGH (ref 0.5–1.5)
Methemoglobin: 1 % (ref 0.0–1.5)
O2 Saturation: 74 %
Total hemoglobin: 11.2 g/dL — ABNORMAL LOW (ref 12.0–16.0)

## 2020-09-01 LAB — PROTIME-INR
INR: 2.1 — ABNORMAL HIGH (ref 0.8–1.2)
Prothrombin Time: 23.1 seconds — ABNORMAL HIGH (ref 11.4–15.2)

## 2020-09-01 LAB — GLUCOSE, CAPILLARY
Glucose-Capillary: 118 mg/dL — ABNORMAL HIGH (ref 70–99)
Glucose-Capillary: 134 mg/dL — ABNORMAL HIGH (ref 70–99)
Glucose-Capillary: 114 mg/dL — ABNORMAL HIGH (ref 70–99)

## 2020-09-01 MED ORDER — WARFARIN SODIUM 5 MG PO TABS
5.0000 mg | ORAL_TABLET | Freq: Every day | ORAL | Status: DC
  Administered 2020-09-01: 5 mg via ORAL
  Filled 2020-09-01: qty 1

## 2020-09-01 MED ORDER — CEFAZOLIN SODIUM-DEXTROSE 2-4 GM/100ML-% IV SOLN
INTRAVENOUS | Status: AC
  Filled 2020-09-01: qty 100

## 2020-09-01 MED ORDER — SODIUM CHLORIDE 0.9% FLUSH
10.0000 mL | Freq: Two times a day (BID) | INTRAVENOUS | Status: DC
  Administered 2020-09-01 – 2020-09-05 (×5): 10 mL

## 2020-09-01 MED ORDER — SODIUM CHLORIDE 0.9% FLUSH
10.0000 mL | INTRAVENOUS | Status: DC | PRN
Start: 2020-09-01 — End: 2020-09-06

## 2020-09-01 MED ORDER — POTASSIUM CHLORIDE CRYS ER 20 MEQ PO TBCR
40.0000 meq | EXTENDED_RELEASE_TABLET | Freq: Once | ORAL | Status: AC
  Administered 2020-09-01: 40 meq via ORAL
  Filled 2020-09-01: qty 2

## 2020-09-01 MED ORDER — HEPARIN (PORCINE) IN NACL 1000-0.9 UT/500ML-% IV SOLN
INTRAVENOUS | Status: AC
  Filled 2020-09-01: qty 500

## 2020-09-01 MED ORDER — BUPIVACAINE HCL (PF) 0.25 % IJ SOLN
INTRAMUSCULAR | Status: AC
  Filled 2020-09-01: qty 30

## 2020-09-01 NOTE — Progress Notes (Signed)
ANTICOAGULATION CONSULT NOTE - Follow Up Consult  Pharmacy Consult for Coumadin Indication: atrial fibrillation  Allergies  Allergen Reactions  . Tuna [Fish Allergy] Nausea And Vomiting    Patient Measurements: Height: 6' (182.9 cm) Weight: 76 kg (167 lb 8.8 oz) IBW/kg (Calculated) : 77.6  Vital Signs: Temp: 99.32 F (37.4 C) (12/13 1230) Temp Source: Core (12/13 0800) BP: 99/67 (12/13 1230) Pulse Rate: 84 (12/13 1230)  Labs: Recent Labs    08/30/20 0432 08/31/20 0329 09/01/20 0229  HGB 10.6* 11.3* 11.7*  HCT 32.5* 34.3* 35.4*  PLT 106* 103* 106*  LABPROT 21.6* 23.4* 23.1*  INR 2.0* 2.2* 2.1*  CREATININE 2.09* 2.24* 2.12*    Estimated Creatinine Clearance: 31.9 mL/min (A) (by C-G formula based on SCr of 2.12 mg/dL (H)).   Medical History: Past Medical History:  Diagnosis Date  . AICD (automatic cardioverter/defibrillator) present   . Atrial fibrillation ([place])   . 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 CHF (congestive heart failure) ([place])   . Diabetes mellitus without complication ([place])   . Endocarditis   . Hypertension   . Peripheral vascular disease ([place])   . PVC's (premature ventricular contractions)   . SVT (supraventricular tachycardia)  long RP     Assessment: 74 yo male presented on 08/25/2020 as a direct admission from EP clinic for significant SOB with minimal exertion, PND, and orthopnea. Patient is s/p bioprosthetic mitral valve repair in 2014. Patient is on warfarin prior to admission for Afib. INR 1.9 on admission.  INR 2.. Patient continues on amiodarone infusion which can potentiate warfarin effects. CBC stable, no bleeding noted.   Prior to admission warfarin regimen: warfarin 5mg  daily   Goal of Therapy:  INR 2-3 Monitor platelets by anticoagulation protocol: Yes   Plan:  Warfarin 5mg  x1 tonight  Monitor INR, CBC and S/S of bleeding daily    [name] Pharm.D. CPP, BCPS Clinical Pharmacist [phone] 09/01/2020 2:41 PM

## 2020-09-01 NOTE — Progress Notes (Signed)
Patient ID: Ryan [name], male   DOB: 1946/01/16, 74 y.o.   MRN: [id number]     Advanced Heart Failure Rounding Note  PCP-Cardiologist: [name] III, MD   Subjective:    Milrinone 0.25 started yesterday with low output, co-ox 74% this morning.  I/Os negative.  He remains on Lasix gtt 12 mg/hr.    He was in NSR in 80s this morning then went back into SVT around 120 as soon as I walked in the room.   No complaints.   Swan numbers CVP 2-3 PA 34/17  CI 1.7 Co-ox 74%  RHC Procedural Findings: Hemodynamics (mmHg) RA mean 31 RV 53/21, mean 30 PA 57/33, mean 45 PCWP mean 36 Oxygen saturations: PA 59% AO 98% Cardiac Output (Fick) 4.63  Cardiac Index (Fick) 2.28 Cardiac Output (Thermo) 4.72  Cardiac Index (Thermo) 2.33 PVR 1.9 WU PAPi 0.8  Objective:   Weight Range: 76 kg Body mass index is 22.72 kg/m.   Vital Signs:   Temp:  [98.24 F (36.8 C)-99.5 F (37.5 C)] 98.96 F (37.2 C) (12/13 0800) Pulse Rate:  [37-114] 82 (12/13 0800) Resp:  [9-49] 15 (12/13 0800) BP: (79-131)/(48-90) 99/64 (12/13 0800) SpO2:  [87 %-100 %] 97 % (12/13 0800) Last BM Date: 08/31/20  Weight change: Filed Weights   08/29/20 0019 08/30/20 0500 08/31/20 0500  Weight: 81.2 kg 78 kg 76 kg    Intake/Output:   Intake/Output Summary (Last 24 hours) at 09/01/2020 0827 Last data filed at 09/01/2020 0730 Gross per 24 hour  Intake 840.62 ml  Output 2475 ml  Net -1634.38 ml      Physical Exam    General: NAD Neck: No JVD, no thyromegaly or thyroid nodule.  Lungs: Clear to auscultation bilaterally with normal respiratory effort. CV: Nondisplaced PMI.  Heart Tachy, regular S1/S2, no S3/S4, no murmur.  No peripheral edema.   Abdomen: Soft, nontender, no hepatosplenomegaly, no distention.  Skin: Intact without lesions or rashes.  Neurologic: Alert and oriented x 3.  Psych: Normal affect. Extremities: No clubbing or cyanosis.  HEENT: Normal.    Telemetry   NSR => SVT 120s  (personally reviewed).    Labs    CBC Recent Labs    08/31/20 0329 09/01/20 0229  WBC 6.3 6.1  HGB 11.3* 11.7*  HCT 34.3* 35.4*  MCV 72.1* 71.1*  PLT 103* 546*   Basic Metabolic Panel Recent Labs    08/31/20 0329 09/01/20 0229  NA 139 135  K 3.6 3.2*  CL 101 93*  CO2 28 31  GLUCOSE 121* 133*  BUN 36* 34*  CREATININE 2.24* 2.12*  CALCIUM 9.1 8.8*  MG 1.9  --    Liver Function Tests No results for input(s): AST, ALT, ALKPHOS, BILITOT, PROT, ALBUMIN in the last 72 hours. No results for input(s): LIPASE, AMYLASE in the last 72 hours. Cardiac Enzymes No results for input(s): CKTOTAL, CKMB, CKMBINDEX, TROPONINI in the last 72 hours.  BNP: BNP (last 3 results) Recent Labs    08/26/20 0622  BNP 2,235.1*    ProBNP (last 3 results) Recent Labs    08/25/20 1121  PROBNP 21,395*     D-Dimer No results for input(s): DDIMER in the last 72 hours. Hemoglobin A1C No results for input(s): HGBA1C in the last 72 hours. Fasting Lipid Panel No results for input(s): CHOL, HDL, LDLCALC, TRIG, CHOLHDL, LDLDIRECT in the last 72 hours. Thyroid Function Tests No results for input(s): TSH, T4TOTAL, T3FREE, THYROIDAB in the last 72 hours.  Invalid  input(s): FREET3  Other results:   Imaging    [place] EKG SITE RITE  Result Date: 09/01/2020 If Site Rite image not attached, placement could not be confirmed due to current cardiac rhythm.    Medications:     Scheduled Medications: . atorvastatin  40 mg Oral q1800  . Chlorhexidine Gluconate Cloth  6 each Topical Daily  . metoprolol succinate  25 mg Oral Daily  . midodrine  2.5 mg Oral BID WC  . multivitamin with minerals   Oral Once per day on Mon Wed Fri  . pantoprazole  40 mg Oral Daily  . polyethylene glycol  17 g Oral Daily  . potassium chloride  40 mEq Oral Once  . sodium chloride flush  10-40 mL Intracatheter Q12H  . sodium chloride flush  3 mL Intravenous Q12H  . tamsulosin  0.4 mg Oral Daily  . Warfarin -  Pharmacist Dosing Inpatient   Does not apply q1600    Infusions: . amiodarone 30 mg/hr (09/01/20 0700)  . milrinone 0.25 mcg/kg/min (09/01/20 0700)    PRN Medications: acetaminophen, ondansetron (ZOFRAN) IV, senna-docusate, sodium chloride flush   Assessment/Plan   1. 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  Echo from this admission was reviewed, by my interpretation EF 20-25%, moderate RV enlargement with severely decreased RV systolic function, normally functioning bioprosthetic mitral valve with no significant MR and mean gradient 6, moderate TR, dilated IVC.  Patient had normal EF in 12/13 pre-mitral valve replacement.  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  His last cath was in 1/14 pre-surgery, no significant CAD. On [place] 12/10, evidence for severe biventricular failure with PCWP and RA pressure > 30 and PAPi 0.8.  Equalization of diastolic pressures concerning for restrictive physiology.  Cardiac output preserved (CI 2.28 Fick, 2.33 thermo).   It is possible that cardiomyopathy is at least in part tachycardia-mediated given frequent runs of SVT (though rate is relatively slow, 110s for the most part).  Also has history of frequent PVCs.  Cannot rule out coronary disease though no coronary angiography yet with elevated creatinine.  He was started on Lasix gtt and got metolazone yesterday again, diuresed well overnight with weight down another 4 lbs.  CVP 2-3, PA pressures down as well.  Milrinone started with low output, creatinine lower at 2.12.  Co-ox excellent at 74% though CO by thermodilution still reading low.   - Stop Lasix today with low CVP.  - Continue milrinone 0.25 mcg/kg/min for now, can stop if needed around time of SVT ablation.   - Eventual coronary angiography to assess for CAD as cause of worsened EF if creatinine stabilizes (no ACS so not urgent).  - No  ARNI/ARB/spironolactone/digoxin with elevated creatinine.  - No Bidil for now with soft BP, consider if BP stabilizes. He has been on midodrine 2.5 bid, SBP in 100s.  - Continue Toprol XL 25 mg daily.  - Replace Swan with PICC and follow co-ox.  - I am concerned that his prognosis is not going to be good given quite significant RV dysfunction.  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  On amiodarone gtt, he has been in and out of NSR, currently back in SVT. ?Component of tachy-mediated cardiomyopathy.  HR not markedly high when in SVT but he seems to have been predominantly in SVT recently.  - Ablation by EP, ?today since volume status much  improved. We need to keep in NSR if at all possible given concern for tachy-mediated CMP.  - Amiodarone gtt at 30 mg/hr for now.  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 This may play a role in biventricular failure.  - Watch carefully with milrinone.  - Continue amiodarone.  4. PAD: h/o right popliteal occlusion. Continue atorvastatin.  5. Atrial fibrillation: Paroxysmal.  Have not seen atrial fibrillation this admission. Has history of CVA.  - Continue warfarin.  6. 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  7. AKI on CKD stage 3: Suspect cardiorenal syndrome in setting of RV failure/markedly elevated CVP. Creatinine lower after starting milrinone.   CRITICAL CARE Performed by: Loralie Champagne  Total critical care time: 35 minutes  Critical care time was exclusive of separately billable procedures and treating other patients.  Critical care was necessary to treat or prevent imminent or life-threatening deterioration.  Critical care was time spent personally by me on the following activities: development of treatment plan with patient and/or surrogate as well as nursing, discussions with consultants, evaluation of patient's response to treatment, examination of patient, obtaining history from  patient or surrogate, ordering and performing treatments and interventions, ordering and review of laboratory studies, ordering and review of radiographic studies, pulse oximetry and re-evaluation of patient's condition. .   Length of Stay: [place], MD  09/01/2020, 8:27 AM  Advanced Heart Failure Team Pager [phone] (M-F; 7a - 4p)  Please contact [facility] for night-coverage after hours (4p -7a ) and weekends on [email or web address]

## 2020-09-01 NOTE — Plan of Care (Signed)
  Problem: Education: Goal: Knowledge of General Education information will improve Description: Including pain rating scale, medication(s)/side effects and non-pharmacologic comfort measures Outcome: Progressing Note: Understands plan of care well. Working on improving knowledge of medications   Problem: Health Behavior/Discharge Planning: Goal: Ability to manage health-related needs will improve Outcome: Progressing   Problem: Clinical Measurements: Goal: Ability to maintain clinical measurements within normal limits will improve Outcome: Progressing Goal: Will remain free from infection Outcome: Progressing Goal: Diagnostic test results will improve Outcome: Progressing Note: Cardiac hemodynamics are improving and able to discontinue certain drips as directed by physicians. Working towards ablation therapy with electrophysiology care group.  Goal: Respiratory complications will improve Outcome: Progressing Goal: Cardiovascular complication will be avoided Outcome: Progressing   Problem: Activity: Goal: Risk for activity intolerance will decrease Outcome: Progressing Note: Still weak on feet by improving   Problem: Nutrition: Goal: Adequate nutrition will be maintained Outcome: Progressing   Problem: Coping: Goal: Level of anxiety will decrease Outcome: Progressing   Problem: Elimination: Goal: Will not experience complications related to bowel motility Outcome: Progressing Goal: Will not experience complications related to urinary retention Outcome: Progressing   Problem: Pain Managment: Goal: General experience of comfort will improve Outcome: Progressing   Problem: Safety: Goal: Ability to remain free from injury will improve Outcome: Progressing   Problem: Skin Integrity: Goal: Risk for impaired skin integrity will decrease Outcome: Progressing   Problem: Education: Goal: Ability to demonstrate management of disease process will improve Outcome:  Progressing Goal: Ability to verbalize understanding of medication therapies will improve Outcome: Progressing   Problem: Activity: Goal: Capacity to carry out activities will improve Outcome: Progressing Note: Working on mobility to go back home   Problem: Cardiac: Goal: Ability to achieve and maintain adequate cardiopulmonary perfusion will improve Outcome: Progressing

## 2020-09-01 NOTE — Progress Notes (Signed)
Peripherally Inserted Central Catheter Placement  The IV Nurse has discussed with the patient and/or persons authorized to consent for the patient, the purpose of this procedure and the potential benefits and risks involved with this procedure.  The benefits include less needle sticks, lab draws from the catheter, and the patient may be discharged home with the catheter. Risks include, but not limited to, infection, bleeding, blood clot (thrombus formation), and puncture of an artery; nerve damage and irregular heartbeat and possibility to perform a PICC exchange if needed/ordered by physician.  Alternatives to this procedure were also discussed.  Bard Power PICC patient education guide, fact sheet on infection prevention and patient information card has been provided to patient /or left at bedside.    PICC Placement Documentation  PICC Triple Lumen 09/01/20 PICC Right Brachial 37 cm 0 cm (Active)  Indication for Insertion or Continuance of Line Vasoactive infusions 09/01/20 1523  Exposed Catheter (cm) 0 cm 09/01/20 1523  Site Assessment Clean;Dry;Intact 09/01/20 1523  Lumen #1 Status Flushed;Saline locked;Blood return noted 09/01/20 1523  Lumen #2 Status Flushed;Saline locked;Blood return noted 09/01/20 1523  Lumen #3 Status Flushed;Saline locked;Blood return noted 09/01/20 1523  Dressing Type Transparent;Securing device 09/01/20 1523  Dressing Status Clean;Dry;Intact 09/01/20 1523  Antimicrobial disc in place? Yes 09/01/20 1523  Safety Lock Not Applicable 26/71/24 5809  Dressing Intervention New dressing 09/01/20 1523  Dressing Change Due 09/08/20 09/01/20 1523       Enos Fling 09/01/2020, 3:25 PM

## 2020-09-01 NOTE — Progress Notes (Addendum)
Electrophysiology Rounding Note  Patient Name: Ryan [name] Date of Encounter: 09/01/2020  Primary Cardiologist: [name], MD Electrophysiologist: Constance Haw, MD  Subjective   Feeling a little better. Not much appetite.   Inpatient Medications    Scheduled Meds: . atorvastatin  40 mg Oral q1800  . Chlorhexidine Gluconate Cloth  6 each Topical Daily  . metoprolol succinate  25 mg Oral Daily  . midodrine  2.5 mg Oral BID WC  . multivitamin with minerals   Oral Once per day on Mon Wed Fri  . pantoprazole  40 mg Oral Daily  . polyethylene glycol  17 g Oral Daily  . sodium chloride flush  10-40 mL Intracatheter Q12H  . sodium chloride flush  3 mL Intravenous Q12H  . tamsulosin  0.4 mg Oral Daily  . Warfarin - Pharmacist Dosing Inpatient   Does not apply q1600   Continuous Infusions: . amiodarone 30 mg/hr (09/01/20 0700)  . furosemide (LASIX) 200 mg in dextrose 5% 100 mL (2mg /mL) infusion 12 mg/hr (09/01/20 0700)  . milrinone 0.25 mcg/kg/min (09/01/20 0700)   PRN Meds: acetaminophen, ondansetron (ZOFRAN) IV, senna-docusate, sodium chloride flush   Vital Signs    Vitals:   09/01/20 0500 09/01/20 0600 09/01/20 0700 09/01/20 0730  BP: (!) 93/59 (!) 79/63 (!) 99/58   Pulse: (!) 52 60 80 80  Resp: (!) 21 16 13 20   Temp: 99.32 F (37.4 C) 99.32 F (37.4 C) 99.14 F (37.3 C) 98.96 F (37.2 C)  TempSrc:      SpO2: 93% 96% 96% 98%  Weight:      Height:        Intake/Output Summary (Last 24 hours) at 09/01/2020 0753 Last data filed at 09/01/2020 0730 Gross per 24 hour  Intake 1080.62 ml  Output 2775 ml  Net -1694.38 ml   Filed Weights   08/29/20 0019 08/30/20 0500 08/31/20 0500  Weight: 81.2 kg 78 kg 76 kg    Physical Exam    GEN- The patient is fatigued appearing, alert and oriented x 3 today.   Head- normocephalic, atraumatic Eyes-  Sclera clear, conjunctiva pink Ears- hearing intact Oropharynx- clear Neck- supple Lungs- Clear to  ausculation bilaterally, normal work of breathing Heart- Regular rate and rhythm, no murmurs, rubs or gallops GI- soft, NT, ND, + BS Extremities- no clubbing or cyanosis. No edema Skin- no rash or lesion Psych- euthymic mood, full affect Neuro- strength and sensation are intact  Labs    CBC Recent Labs    08/31/20 0329 09/01/20 0229  WBC 6.3 6.1  HGB 11.3* 11.7*  HCT 34.3* 35.4*  MCV 72.1* 71.1*  PLT 103* 242*   Basic Metabolic Panel Recent Labs    08/31/20 0329 09/01/20 0229  NA 139 135  K 3.6 3.2*  CL 101 93*  CO2 28 31  GLUCOSE 121* 133*  BUN 36* 34*  CREATININE 2.24* 2.12*  CALCIUM 9.1 8.8*  MG 1.9  --    Liver Function Tests No results for input(s): AST, ALT, ALKPHOS, BILITOT, PROT, ALBUMIN in the last 72 hours. No results for input(s): LIPASE, AMYLASE in the last 72 hours. Cardiac Enzymes No results for input(s): CKTOTAL, CKMB, CKMBINDEX, TROPONINI in the last 72 hours.  RHC 08/29/2020  Hemodynamics (mmHg) RA mean 31 RV 53/21, mean 30 PA 57/33, mean 45 PCWP mean 36 Oxygen saturations: PA 59% AO 98% Cardiac Output (Fick) 4.63  Cardiac Index (Fick) 2.28 Cardiac Output (Thermo) 4.72  Cardiac Index (Thermo)  2.33 PVR 1.9 WU PAPi 0.8  Telemetry    NSR as of this am since ~0630, prior to that, was in his SVT ~120 bpm (personally reviewed)  Radiology    No results found.  Patient Profile      Ryan [name] is a 74 y.o. male with a history of endocarditis s/p mitral valve replacement in 2014, atrial fibrillation/flutter, HTN, DM2, and chronic systolic heart failure. He followed with EP for significant PVC burden (20%) with an EF of 30-35% and accelerated junctional rhythm - status post medtronic ICD.   Assessment & Plan    1. 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  Dr. [name] to see to discuss timing pending optimization of volume status.  Continue BB as  tolerated    2. Acute on chronic systolic CHF Appreciate HF team management.  [place] 08/29/2020 with significantly elevated filling pressures and restrictive physiology.  Diuresing on lasix gtt with swan in place.  Negative 1.4 L yesterday. Diuresis per CHF team.  Coox 74% this am. CVP down to 3-4 this am. Cr 2.12. K 3.2 this am.    3. PAF On coumadin for CHA2DS2VASC of at least 7 (h/o CVA per chart) INR 2.1 this am.   4. Hypokalemia K 3.2. Supp with goal > 4.0 Mg 1.9.   Seger Jani discuss timing of ablation with MD, pending volume status optimization  For questions or updates, please contact [place] Please consult [email or web address] for contact info under Cardiology/STEMI.  Signed, Shirley Friar, PA-C  09/01/2020, 7:53 AM   I have seen and examined this patient with [name].  Agree with above, note added to reflect my findings.  On exam, tachycardic, regular. Patient went back into SVT. Have discussed ablation with the patient and family. Risks and benefits discussed and include bleeding, tamponade, heart block, stroke. If the procedure can be done today, Mariell Nester plan for ablation. Keyonta Barradas need PA catheter removed and off of milrinone.  Harrietta Incorvaia M. Leatta Alewine MD 09/01/2020 10:54 AM

## 2020-09-02 LAB — BASIC METABOLIC PANEL
Anion gap: 13 (ref 5–15)
BUN: 36 mg/dL — ABNORMAL HIGH (ref 8–23)
CO2: 29 mmol/L (ref 22–32)
Calcium: 8.7 mg/dL — ABNORMAL LOW (ref 8.9–10.3)
Chloride: 92 mmol/L — ABNORMAL LOW (ref 98–111)
Creatinine, Ser: 2.29 mg/dL — ABNORMAL HIGH (ref 0.61–1.24)
GFR, Estimated: 29 mL/min — ABNORMAL LOW (ref >60)
Glucose, Bld: 187 mg/dL — ABNORMAL HIGH (ref 70–99)
Potassium: 3.7 mmol/L (ref 3.5–5.1)
Sodium: 134 mmol/L — ABNORMAL LOW (ref 135–145)

## 2020-09-02 LAB — COOXEMETRY PANEL
Carboxyhemoglobin: 1.6 % — ABNORMAL HIGH (ref 0.5–1.5)
Methemoglobin: 0.9 % (ref 0.0–1.5)
O2 Saturation: 68.2 %
Total hemoglobin: 12 g/dL (ref 12.0–16.0)

## 2020-09-02 LAB — PROTIME-INR
INR: 2.5 — ABNORMAL HIGH (ref 0.8–1.2)
Prothrombin Time: 26.2 seconds — ABNORMAL HIGH (ref 11.4–15.2)

## 2020-09-02 LAB — CBC
HCT: 36.7 % — ABNORMAL LOW (ref 39.0–52.0)
Hemoglobin: 11.6 g/dL — ABNORMAL LOW (ref 13.0–17.0)
MCH: 22.6 pg — ABNORMAL LOW (ref 26.0–34.0)
MCHC: 31.6 g/dL (ref 30.0–36.0)
MCV: 71.5 fL — ABNORMAL LOW (ref 80.0–100.0)
Platelets: 103 K/uL — ABNORMAL LOW (ref 150–400)
RBC: 5.13 MIL/uL (ref 4.22–5.81)
RDW: 18.7 % — ABNORMAL HIGH (ref 11.5–15.5)
WBC: 8.6 K/uL (ref 4.0–10.5)
nRBC: 0 % (ref 0.0–0.2)

## 2020-09-02 MED ORDER — POTASSIUM CHLORIDE CRYS ER 20 MEQ PO TBCR
40.0000 meq | EXTENDED_RELEASE_TABLET | Freq: Once | ORAL | Status: AC
  Administered 2020-09-02: 40 meq via ORAL
  Filled 2020-09-02: qty 2

## 2020-09-02 MED ORDER — WARFARIN SODIUM 2.5 MG PO TABS
2.5000 mg | ORAL_TABLET | Freq: Once | ORAL | Status: AC
  Administered 2020-09-02: 2.5 mg via ORAL
  Filled 2020-09-02: qty 1

## 2020-09-02 NOTE — Progress Notes (Addendum)
Electrophysiology Rounding Note  Patient Name: Ryan [name] Date of Encounter: 09/02/2020  Primary Cardiologist: Ryan Grooms, MD Electrophysiologist: Ryan Haw, MD   Subjective   Patient feeling OK this am. Remains fatigued. No new complaints  Ablation deferred yesterday as pt had several bites of "breakfast".  Inpatient Medications    Scheduled Meds:  atorvastatin  40 mg Oral q1800   Chlorhexidine Gluconate Cloth  6 each Topical Daily   metoprolol succinate  25 mg Oral Daily   midodrine  2.5 mg Oral BID WC   multivitamin with minerals   Oral Once per day on Mon Wed Fri   pantoprazole  40 mg Oral Daily   polyethylene glycol  17 g Oral Daily   sodium chloride flush  10-40 mL Intracatheter Q12H   sodium chloride flush  10-40 mL Intracatheter Q12H   sodium chloride flush  3 mL Intravenous Q12H   tamsulosin  0.4 mg Oral Daily   warfarin  5 mg Oral q1600   Warfarin - Pharmacist Dosing Inpatient   Does not apply q1600   Continuous Infusions:  amiodarone 30 mg/hr (09/02/20 0700)   milrinone 0.25 mcg/kg/min (09/02/20 0700)   PRN Meds: acetaminophen, ondansetron (ZOFRAN) IV, senna-docusate, sodium chloride flush, sodium chloride flush   Vital Signs    Vitals:   09/02/20 0400 09/02/20 0500 09/02/20 0600 09/02/20 0700  BP: (!) 91/56 111/63 (!) 88/67 100/73  Pulse: (!) 58 (!) 58 (!) 58 (!) 117  Resp: (!) 22 (!) 21 20 17   Temp:      TempSrc:      SpO2: 95% 95% 95% 94%  Weight:  76.1 kg    Height:        Intake/Output Summary (Last 24 hours) at 09/02/2020 0714 Last data filed at 09/02/2020 0700 Gross per 24 hour  Intake 728.47 ml  Output 950 ml  Net -221.53 ml   Filed Weights   08/30/20 0500 08/31/20 0500 09/02/20 0500  Weight: 78 kg 76 kg 76.1 kg    Physical Exam    GEN- The patient is fatigued and chronically ill appearing, alert and oriented x 3 today.   Head- normocephalic, atraumatic Eyes-  Sclera clear, conjunctiva pink Ears-  hearing intact Oropharynx- clear Neck- supple Lungs- Clear to ausculation bilaterally, normal work of breathing Heart- Slightly tachy but regular rate and rhythm, no murmurs, rubs or gallops GI- soft, NT, ND, + BS Extremities- no clubbing or cyanosis. No edema Skin- no rash or lesion Psych- euthymic mood, full affect Neuro- strength and sensation are intact  Labs    CBC Recent Labs    09/01/20 0229 09/02/20 0218  WBC 6.1 8.6  HGB 11.7* 11.6*  HCT 35.4* 36.7*  MCV 71.1* 71.5*  PLT 106* 601*   Basic Metabolic Panel Recent Labs    08/31/20 0329 09/01/20 0229 09/02/20 0218  NA 139 135 134*  K 3.6 3.2* 3.7  CL 101 93* 92*  CO2 28 31 29   GLUCOSE 121* 133* 187*  BUN 36* 34* 36*  CREATININE 2.24* 2.12* 2.29*  CALCIUM 9.1 8.8* 8.7*  MG 1.9  --   --    Liver Function Tests No results for input(s): AST, ALT, ALKPHOS, BILITOT, PROT, ALBUMIN in the last 72 hours. No results for input(s): LIPASE, AMYLASE in the last 72 hours. Cardiac Enzymes No results for input(s): CKTOTAL, CKMB, CKMBINDEX, TROPONINI in the last 72 hours.   Telemetry    Pt has been in SVT since around 2  pm yesterday, Occasional PVCs up to periods of bigeminy noted.  (personally reviewed)  Radiology    DG CHEST PORT 1 VIEW  Result Date: 09/01/2020 CLINICAL DATA:  74 year old male status post PICC line placement. EXAM: PORTABLE CHEST 1 VIEW COMPARISON:  08/25/2020 chest radiographs and earlier. FINDINGS: Portable AP semi upright view at 1542 hours. New right upper extremity approach PICC line in place. Tip is at the lower SVC level about 1 vertebral body below the carina. Stable superimposed left chest AICD. Stable cardiac size and mediastinal contours. Prosthetic cardiac valve. Prior sternotomy. Allowing for portable technique the lungs are clear. Visualized tracheal air column is within normal limits. Negative visible bowel gas pattern. IMPRESSION: 1. Right upper extremity approach PICC line placed with tip  at the lower SVC level. 2. Cardiomegaly.  No acute cardiopulmonary abnormality. Electronically Signed   By: Ryan [name] M.D.   On: 09/01/2020 15:57   [place] EKG SITE RITE  Result Date: 09/01/2020 If Site Rite image not attached, placement could not be confirmed due to current cardiac rhythm.   Patient Profile     Ryan [name] is a 74 y.o. male with a history of endocarditis s/p mitral valve replacement in 2014, atrial fibrillation/flutter, HTN, DM2, and chronic systolic heart failure. He followed with EP for significant PVC burden (20%) with an EF of 30-35% and accelerated junctional rhythm - status post medtronic ICD.   Assessment & Plan    1. 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  Plan for ablation tomorrow, 12/15. Deferred yesterday due to breakfast. No room on schedule today.  Continue BB as tolerated    2. Acute on chronic systolic CHF Appreciate HF team management.  [name] 08/29/2020 with significantly elevated filling pressures and restrictive physiology.  Lasix gtt held yesterday with CVP ~3-4.  Cr 2.1 -> 2.29 Coox 68.2% this am. K 3.7.    3. PAF On coumadin for CHA2DS2VASC of at least 7 (h/o CVA per chart) INR 2.5 this am.   4. Hypokalemia K 3.7 this am. Supp with goal > 4.0 Mg 1.9.    For questions or updates, please contact [place] Please consult [email or web address] for contact info under Cardiology/STEMI.  Signed, Shirley Friar, PA-C  09/02/2020, 7:14 AM   I have seen and examined this patient with [name].  Agree with above, note added to reflect my findings.  On exam, tachycardic, regular.  Patient remains in SVT.  He [name] need ablation.  [name] plan for tomorrow.  Risks and benefits were described yesterday.  [name] MD 09/02/2020 8:08 AM

## 2020-09-02 NOTE — Progress Notes (Addendum)
Patient ID: Ryan [name], male   DOB: [birth year], 74 y.o.   MRN: [id number]     Advanced Heart Failure Rounding Note  PCP-Cardiologist: [name], MD   Subjective:    On Milrinone 0.25. Co-ox 68%.   Diuretics held yesterday for low CVP. Wt stable/ unchanged at 167 lb.   CVP 4 today  SCr up slightly, 2.12>>2.29. K 3.7  Tachy 110s-120s. SBP low 100s.   Some fatigue but no other complaints today.     RHC Procedural Findings: Hemodynamics (mmHg) RA mean 31 RV 53/21, mean 30 PA 57/33, mean 45 PCWP mean 36 Oxygen saturations: PA 59% AO 98% Cardiac Output (Fick) 4.63  Cardiac Index (Fick) 2.28 Cardiac Output (Thermo) 4.72  Cardiac Index (Thermo) 2.33 PVR 1.9 WU PAPi 0.8  Objective:   Weight Range: 76.1 kg Body mass index is 22.75 kg/m.   Vital Signs:   Temp:  [98.5 F (36.9 C)-99.32 F (37.4 C)] 98.5 F (36.9 C) (12/13 1933) Pulse Rate:  [56-120] 117 (12/14 0700) Resp:  [13-42] 17 (12/14 0700) BP: (88-121)/(51-87) 100/73 (12/14 0700) SpO2:  [92 %-99 %] 94 % (12/14 0700) Weight:  [76.1 kg] 76.1 kg (12/14 0500) Last BM Date: 08/31/20  Weight change: Filed Weights   08/30/20 0500 08/31/20 0500 09/02/20 0500  Weight: 78 kg 76 kg 76.1 kg    Intake/Output:   Intake/Output Summary (Last 24 hours) at 09/02/2020 0714 Last data filed at 09/02/2020 0700 Gross per 24 hour  Intake 728.47 ml  Output 950 ml  Net -221.53 ml      Physical Exam     General:  Fatigue appearing. Sitting up in bed No respiratory difficulty HEENT: normal Neck: supple. JVD 6 cm. Carotids 2+ bilat; no bruits. No lymphadenopathy or thyromegaly appreciated. Cor: PMI nondisplaced. Regular rhythm, tachy. No rubs, gallops or murmurs. Lungs: clear Abdomen: soft, nontender, nondistended. No hepatosplenomegaly. No bruits or masses. Good bowel sounds. Extremities: no cyanosis, clubbing, rash, edema Neuro: alert & oriented x 3, cranial nerves grossly intact. moves all 4 extremities  w/o difficulty. Affect pleasant.    Telemetry   Tachy 100s-120s (personally reviewed).    Labs    CBC Recent Labs    09/01/20 0229 09/02/20 0218  WBC 6.1 8.6  HGB 11.7* 11.6*  HCT 35.4* 36.7*  MCV 71.1* 71.5*  PLT 106* 371*   Basic Metabolic Panel Recent Labs    08/31/20 0329 09/01/20 0229 09/02/20 0218  NA 139 135 134*  K 3.6 3.2* 3.7  CL 101 93* 92*  CO2 28 31 29   GLUCOSE 121* 133* 187*  BUN 36* 34* 36*  CREATININE 2.24* 2.12* 2.29*  CALCIUM 9.1 8.8* 8.7*  MG 1.9  --   --    Liver Function Tests No results for input(s): AST, ALT, ALKPHOS, BILITOT, PROT, ALBUMIN in the last 72 hours. No results for input(s): LIPASE, AMYLASE in the last 72 hours. Cardiac Enzymes No results for input(s): CKTOTAL, CKMB, CKMBINDEX, TROPONINI in the last 72 hours.  BNP: BNP (last 3 results) Recent Labs    08/26/20 0622  BNP 2,235.1*    ProBNP (last 3 results) Recent Labs    08/25/20 1121  PROBNP 21,395*     D-Dimer No results for input(s): DDIMER in the last 72 hours. Hemoglobin A1C No results for input(s): HGBA1C in the last 72 hours. Fasting Lipid Panel No results for input(s): CHOL, HDL, LDLCALC, TRIG, CHOLHDL, LDLDIRECT in the last 72 hours. Thyroid Function Tests No results for input(s):  TSH, T4TOTAL, T3FREE, THYROIDAB in the last 72 hours.  Invalid input(s): FREET3  Other results:   Imaging    DG CHEST PORT 1 VIEW  Result Date: 09/01/2020 CLINICAL DATA:  74 year old male status post PICC line placement. EXAM: PORTABLE CHEST 1 VIEW COMPARISON:  08/25/2020 chest radiographs and earlier. FINDINGS: Portable AP semi upright view at 1542 hours. New right upper extremity approach PICC line in place. Tip is at the lower SVC level about 1 vertebral body below the carina. Stable superimposed left chest AICD. Stable cardiac size and mediastinal contours. Prosthetic cardiac valve. Prior sternotomy. Allowing for portable technique the lungs are clear. Visualized  tracheal air column is within normal limits. Negative visible bowel gas pattern. IMPRESSION: 1. Right upper extremity approach PICC line placed with tip at the lower SVC level. 2. Cardiomegaly.  No acute cardiopulmonary abnormality. Electronically Signed   By: Genevie Ann M.D.   On: 09/01/2020 15:57   [place] EKG SITE RITE  Result Date: 09/01/2020 If Site Rite image not attached, placement could not be confirmed due to current cardiac rhythm.    Medications:     Scheduled Medications: . atorvastatin  40 mg Oral q1800  . Chlorhexidine Gluconate Cloth  6 each Topical Daily  . metoprolol succinate  25 mg Oral Daily  . midodrine  2.5 mg Oral BID WC  . multivitamin with minerals   Oral Once per day on Mon Wed Fri  . pantoprazole  40 mg Oral Daily  . polyethylene glycol  17 g Oral Daily  . sodium chloride flush  10-40 mL Intracatheter Q12H  . sodium chloride flush  10-40 mL Intracatheter Q12H  . sodium chloride flush  3 mL Intravenous Q12H  . tamsulosin  0.4 mg Oral Daily  . warfarin  5 mg Oral q1600  . Warfarin - Pharmacist Dosing Inpatient   Does not apply q1600    Infusions: . amiodarone 30 mg/hr (09/02/20 0700)  . milrinone 0.25 mcg/kg/min (09/02/20 0700)    PRN Medications: acetaminophen, ondansetron (ZOFRAN) IV, senna-docusate, sodium chloride flush, sodium chloride flush   Assessment/Plan   1. 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  Echo from this admission was reviewed, by my interpretation EF 20-25%, moderate RV enlargement with severely decreased RV systolic function, normally functioning bioprosthetic mitral valve with no significant MR and mean gradient 6, moderate TR, dilated IVC.  Patient had normal EF in 12/13 pre-mitral valve replacement.  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  His last cath was in 1/14 pre-surgery, no significant CAD. On [place] 12/10, evidence  for severe biventricular failure with PCWP and RA pressure > 30 and PAPi 0.8.  Equalization of diastolic pressures concerning for restrictive physiology.  Cardiac output preserved (CI 2.28 Fick, 2.33 thermo).   It is possible that cardiomyopathy is at least in part tachycardia-mediated given frequent runs of SVT (though rate is relatively slow, 110s for the most part).  Also has history of frequent PVCs.  Cannot rule out coronary disease though no coronary angiography yet with elevated creatinine. Milrinone 0.25 started with low output, creatinine relatively stable at 2.29 today.  Co-ox 68%. CVP remains low at 4 - Continue to hold diuretics today.  - Continue milrinone 0.25 mcg/kg/min for now, can stop if needed around time of SVT ablation.   - Eventual coronary angiography to assess for CAD as cause of worsened EF if  creatinine stabilizes (no ACS so not urgent).  - No ARNI/ARB/spironolactone/digoxin with elevated creatinine.  - No Bidil for now with soft BP, consider if BP stabilizes. He has been on midodrine 2.5 bid, SBP in 100s.  - Continue Toprol XL 25 mg daily.  - Continue to follow co-ox and CVPs through PICC  - I am concerned that his prognosis is not going to be good given quite significant RV dysfunction.  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  On amiodarone gtt, he has been in and out of NSR, currently back in SVT. ?Component of tachy-mediated cardiomyopathy.  HR not markedly high when in SVT but he seems to have been predominantly in SVT recently.  - Plan Ablation by EP, scheduled 12/15. We need to keep in NSR if at all possible given concern for tachy-mediated CMP.  - Amiodarone gtt at 30 mg/hr for now.  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 This may play a role in biventricular failure.  - Watch carefully with milrinone.  - Continue amiodarone.  4. PAD: h/o right popliteal occlusion. Continue atorvastatin.  5. Atrial fibrillation: Paroxysmal.  Have  not seen atrial fibrillation this admission. Has history of CVA.  - Continue warfarin.  6. 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  7. AKI on CKD stage 3: Suspect cardiorenal syndrome in setting of RV failure/markedly elevated CVP. Creatinine remains elevated but relatively stable.   . Length of Stay: [address], PA-C  09/02/2020, 7:14 AM  Advanced Heart Failure Team Pager [phone] (M-F; [place])  Please contact [facility] for night-coverage after hours (4p -7a ) and weekends on [email or web address]  Patient seen with PA, agree with the above note.   Creatinine mildly higher at 2.29.  Good co-ox 68%, CVP 4.  SBP 90s-100s, remains on midodrine.  He is in SVT rate around 120.   General: NAD Neck: No JVD, no thyromegaly or thyroid nodule.  Lungs: Clear to auscultation bilaterally with normal respiratory effort. CV: Nondisplaced PMI.  Heart tachy regular S1/S2, no S3/S4, no murmur.  No peripheral edema.   Abdomen: Soft, nontender, no hepatosplenomegaly, no distention.  Skin: Intact without lesions or rashes.  Neurologic: Alert and oriented x 3.  Psych: Normal affect. Extremities: No clubbing or cyanosis.  HEENT: Normal.   Plan now for ablation of incessant SVT on Wednesday.  Continue amiodarone gtt.   CVP 4 with creatinine 2.29, keep off Lasix today.   Co-ox 68%, decrease milrinone to 0.125 to decrease drive for SVT.    MAP stable with low dose of midodrine.    Loralie Champagne 09/02/2020 7:58 AM

## 2020-09-03 ENCOUNTER — Inpatient Hospital Stay (HOSPITAL_COMMUNITY): Payer: Medicare HMO | Admitting: Certified Registered Nurse Anesthetist

## 2020-09-03 ENCOUNTER — Encounter (HOSPITAL_COMMUNITY)
Admission: EM | Disposition: A | Discharge status: Home Health | Payer: Self-pay | Source: Home / Self Care | Attending: Cardiovascular Disease

## 2020-09-03 ENCOUNTER — Encounter (HOSPITAL_COMMUNITY): Payer: Self-pay | Admitting: Cardiovascular Disease

## 2020-09-03 ENCOUNTER — Inpatient Hospital Stay (HOSPITAL_COMMUNITY)
Admission: EM | Disposition: A | Discharge status: Home Health | Payer: Self-pay | Source: Home / Self Care | Attending: Cardiovascular Disease

## 2020-09-03 HISTORY — PX: SVT ABLATION: EP1225

## 2020-09-03 LAB — CBC
HCT: 35.9 % — ABNORMAL LOW (ref 39.0–52.0)
HCT: 36.9 % — ABNORMAL LOW (ref 39.0–52.0)
Hemoglobin: 11.5 g/dL — ABNORMAL LOW (ref 13.0–17.0)
Hemoglobin: 12.1 g/dL — ABNORMAL LOW (ref 13.0–17.0)
MCH: 22.7 pg — ABNORMAL LOW (ref 26.0–34.0)
MCH: 23 pg — ABNORMAL LOW (ref 26.0–34.0)
MCHC: 32 g/dL (ref 30.0–36.0)
MCHC: 32.8 g/dL (ref 30.0–36.0)
MCV: 70.9 fL — ABNORMAL LOW (ref 80.0–100.0)
MCV: 70 fL — ABNORMAL LOW (ref 80.0–100.0)
Platelets: 106 10*3/uL — ABNORMAL LOW (ref 150–400)
Platelets: 109 10*3/uL — ABNORMAL LOW (ref 150–400)
RBC: 5.06 MIL/uL (ref 4.22–5.81)
RBC: 5.27 MIL/uL (ref 4.22–5.81)
RDW: 17.7 % — ABNORMAL HIGH (ref 11.5–15.5)
RDW: 17.5 % — ABNORMAL HIGH (ref 11.5–15.5)
WBC: 8.4 10*3/uL (ref 4.0–10.5)
WBC: 8.5 10*3/uL (ref 4.0–10.5)
nRBC: 0 % (ref 0.0–0.2)
nRBC: 0 % (ref 0.0–0.2)

## 2020-09-03 LAB — BASIC METABOLIC PANEL
Anion gap: 12 (ref 5–15)
BUN: 42 mg/dL — ABNORMAL HIGH (ref 8–23)
CO2: 29 mmol/L (ref 22–32)
Calcium: 8.4 mg/dL — ABNORMAL LOW (ref 8.9–10.3)
Chloride: 93 mmol/L — ABNORMAL LOW (ref 98–111)
Creatinine, Ser: 2.17 mg/dL — ABNORMAL HIGH (ref 0.61–1.24)
GFR, Estimated: 31 mL/min — ABNORMAL LOW (ref >60)
Glucose, Bld: 110 mg/dL — ABNORMAL HIGH (ref 70–99)
Potassium: 3.8 mmol/L (ref 3.5–5.1)
Sodium: 134 mmol/L — ABNORMAL LOW (ref 135–145)

## 2020-09-03 LAB — COOXEMETRY PANEL
Carboxyhemoglobin: 1.5 % (ref 0.5–1.5)
Methemoglobin: 0.9 % (ref 0.0–1.5)
O2 Saturation: 68.9 %
Total hemoglobin: 11.8 g/dL — ABNORMAL LOW (ref 12.0–16.0)

## 2020-09-03 LAB — PROTIME-INR
INR: 3.1 — ABNORMAL HIGH (ref 0.8–1.2)
Prothrombin Time: 30.7 seconds — ABNORMAL HIGH (ref 11.4–15.2)

## 2020-09-03 LAB — GLUCOSE, CAPILLARY: Glucose-Capillary: 101 mg/dL — ABNORMAL HIGH (ref 70–99)

## 2020-09-03 SURGERY — SVT ABLATION
Anesthesia: Monitor Anesthesia Care

## 2020-09-03 SURGERY — SVT ABLATION
Anesthesia: General

## 2020-09-03 MED ORDER — DOBUTAMINE IN D5W 4-5 MG/ML-% IV SOLN
INTRAVENOUS | Status: AC
  Filled 2020-09-03: qty 250

## 2020-09-03 MED ORDER — SODIUM CHLORIDE 0.9 % IV SOLN: 250.0000 mL | INTRAVENOUS | Status: DC | PRN

## 2020-09-03 MED ORDER — BUPIVACAINE HCL (PF) 0.25 % IJ SOLN
INTRAMUSCULAR | Status: AC
  Filled 2020-09-03: qty 60

## 2020-09-03 MED ORDER — HEPARIN (PORCINE) IN NACL 1000-0.9 UT/500ML-% IV SOLN
INTRAVENOUS | Status: DC | PRN
  Administered 2020-09-03 (×2): 500 mL

## 2020-09-03 MED ORDER — CEFAZOLIN SODIUM-DEXTROSE 2-3 GM-%(50ML) IV SOLR
INTRAVENOUS | Status: DC | PRN
  Administered 2020-09-03: 2 g via INTRAVENOUS

## 2020-09-03 MED ORDER — WARFARIN - PHARMACIST DOSING INPATIENT: Freq: Every day | Status: DC

## 2020-09-03 MED ORDER — SODIUM CHLORIDE 0.9% FLUSH
3.0000 mL | Freq: Two times a day (BID) | INTRAVENOUS | Status: DC
  Administered 2020-09-04 – 2020-09-05 (×3): 3 mL via INTRAVENOUS

## 2020-09-03 MED ORDER — DOXYCYCLINE HYCLATE 100 MG PO TABS
100.0000 mg | ORAL_TABLET | Freq: Two times a day (BID) | ORAL | Status: DC
  Administered 2020-09-03 – 2020-09-06 (×6): 100 mg via ORAL
  Filled 2020-09-03 (×6): qty 1

## 2020-09-03 MED ORDER — PROPOFOL 500 MG/50ML IV EMUL
INTRAVENOUS | Status: DC | PRN
  Administered 2020-09-03: 100 ug/kg/min via INTRAVENOUS

## 2020-09-03 MED ORDER — LACTATED RINGERS IV SOLN: INTRAVENOUS | Status: DC | PRN

## 2020-09-03 MED ORDER — BUPIVACAINE HCL (PF) 0.25 % IJ SOLN
INTRAMUSCULAR | Status: AC
  Filled 2020-09-03: qty 30

## 2020-09-03 MED ORDER — BUPIVACAINE HCL (PF) 0.25 % IJ SOLN
INTRAMUSCULAR | Status: DC | PRN
  Administered 2020-09-03: 60 mL

## 2020-09-03 MED ORDER — SODIUM CHLORIDE 0.9% FLUSH: 3.0000 mL | INTRAVENOUS | Status: DC | PRN

## 2020-09-03 MED ORDER — PROPOFOL 10 MG/ML IV BOLUS
INTRAVENOUS | Status: DC | PRN
  Administered 2020-09-03 (×2): 10 mg via INTRAVENOUS

## 2020-09-03 MED ORDER — METOPROLOL SUCCINATE ER 25 MG PO TB24
25.0000 mg | ORAL_TABLET | Freq: Every day | ORAL | Status: DC
  Administered 2020-09-04 – 2020-09-06 (×3): 25 mg via ORAL
  Filled 2020-09-03 (×4): qty 1

## 2020-09-03 MED ORDER — SODIUM CHLORIDE 0.9 % IV SOLN: INTRAVENOUS | Status: DC

## 2020-09-03 MED ORDER — ACETAMINOPHEN 325 MG PO TABS
ORAL_TABLET | ORAL | Status: AC
  Filled 2020-09-03: qty 2

## 2020-09-03 MED ORDER — PHENYLEPHRINE HCL-NACL 10-0.9 MG/250ML-% IV SOLN
INTRAVENOUS | Status: DC | PRN
  Administered 2020-09-03: 10 ug/min via INTRAVENOUS

## 2020-09-03 MED ORDER — OXYCODONE HCL 5 MG PO TABS
5.0000 mg | ORAL_TABLET | Freq: Once | ORAL | Status: AC | PRN
  Administered 2020-09-03: 5 mg via ORAL
  Filled 2020-09-03: qty 1

## 2020-09-03 MED ORDER — CEFAZOLIN SODIUM-DEXTROSE 2-4 GM/100ML-% IV SOLN
INTRAVENOUS | Status: AC
  Filled 2020-09-03: qty 100

## 2020-09-03 SURGICAL SUPPLY — 14 items
BAG SNAP BAND KOVER 36X36 (MISCELLANEOUS) ×1 IMPLANT
BLANKET WARM UNDERBOD FULL ACC (MISCELLANEOUS) ×1 IMPLANT
CATH EZ STEER NAV 4MM D-F CUR (ABLATOR) ×1 IMPLANT
CATH JOSEPH QUAD ALLRED 6F REP (CATHETERS) ×2 IMPLANT
CATH WEBSTER BI DIR CS D-F CRV (CATHETERS) ×1 IMPLANT
CLOSURE PERCLOSE PROSTYLE (VASCULAR PRODUCTS) ×4 IMPLANT
PACK EP LATEX FREE (CUSTOM PROCEDURE TRAY) ×2
PACK EP LF (CUSTOM PROCEDURE TRAY) ×1 IMPLANT
PAD PRO RADIOLUCENT 2001M-C (PAD) ×2 IMPLANT
PATCH CARTO3 (PAD) ×1 IMPLANT
SHEATH PINNACLE 6F 10CM (SHEATH) ×2 IMPLANT
SHEATH PINNACLE 7F 10CM (SHEATH) ×1 IMPLANT
SHEATH PINNACLE 8F 10CM (SHEATH) ×1 IMPLANT
SHEATH PROBE COVER 6X72 (BAG) ×1 IMPLANT

## 2020-09-03 NOTE — Progress Notes (Signed)
Patient ID: Ryan [name], male   DOB: 1946-08-22, 73 y.o.   MRN: [id number]     Advanced Heart Failure Rounding Note  PCP-Cardiologist: [name], MD   Subjective:    On Milrinone 0.125. Co-ox 69%. CVP 4.   Creatinine 2.17, mildly lower.   Tachy 110s-120s with SVT. SBP low 100s.   Some fatigue but no other complaints today.     RHC Procedural Findings: Hemodynamics (mmHg) RA mean 31 RV 53/21, mean 30 PA 57/33, mean 45 PCWP mean 36 Oxygen saturations: PA 59% AO 98% Cardiac Output (Fick) 4.63  Cardiac Index (Fick) 2.28 Cardiac Output (Thermo) 4.72  Cardiac Index (Thermo) 2.33 PVR 1.9 WU PAPi 0.8  Objective:   Weight Range: 76.1 kg Body mass index is 22.75 kg/m.   Vital Signs:   Temp:  [97.6 F (36.4 C)-99.3 F (37.4 C)] 97.6 F (36.4 C) (12/15 0747) Pulse Rate:  [56-118] 69 (12/15 0747) Resp:  [13-22] 15 (12/15 0747) BP: (68-113)/(45-90) 113/60 (12/15 0747) SpO2:  [89 %-98 %] 95 % (12/15 0747) Last BM Date: 09/01/20  Weight change: Filed Weights   08/30/20 0500 08/31/20 0500 09/02/20 0500  Weight: 78 kg 76 kg 76.1 kg    Intake/Output:   Intake/Output Summary (Last 24 hours) at 09/03/2020 1013 Last data filed at 09/03/2020 0728 Gross per 24 hour  Intake -  Output 625 ml  Net -625 ml      Physical Exam    General: NAD Neck: No JVD, no thyromegaly or thyroid nodule.  Lungs: Clear to auscultation bilaterally with normal respiratory effort. CV: Nondisplaced PMI.  Heart irregular S1/S2, no S3/S4, no murmur.  No peripheral edema.   Abdomen: Soft, nontender, no hepatosplenomegaly, no distention.  Skin: Intact without lesions or rashes.  Neurologic: Alert and oriented x 3.  Psych: Normal affect. Extremities: No clubbing or cyanosis.  HEENT: Normal.    Telemetry   Tachy 100s-120s, SVT (personally reviewed).    Labs    CBC Recent Labs    09/02/20 0218 09/03/20 0500  WBC 8.6 8.4  HGB 11.6* 11.5*  HCT 36.7* 35.9*  MCV 71.5*  70.9*  PLT 103* 226*   Basic Metabolic Panel Recent Labs    09/02/20 0218 09/03/20 0651  NA 134* 134*  K 3.7 3.8  CL 92* 93*  CO2 29 29  GLUCOSE 187* 110*  BUN 36* 42*  CREATININE 2.29* 2.17*  CALCIUM 8.7* 8.4*   Liver Function Tests No results for input(s): AST, ALT, ALKPHOS, BILITOT, PROT, ALBUMIN in the last 72 hours. No results for input(s): LIPASE, AMYLASE in the last 72 hours. Cardiac Enzymes No results for input(s): CKTOTAL, CKMB, CKMBINDEX, TROPONINI in the last 72 hours.  BNP: BNP (last 3 results) Recent Labs    08/26/20 0622  BNP 2,235.1*    ProBNP (last 3 results) Recent Labs    08/25/20 1121  PROBNP 21,395*     D-Dimer No results for input(s): DDIMER in the last 72 hours. Hemoglobin A1C No results for input(s): HGBA1C in the last 72 hours. Fasting Lipid Panel No results for input(s): CHOL, HDL, LDLCALC, TRIG, CHOLHDL, LDLDIRECT in the last 72 hours. Thyroid Function Tests No results for input(s): TSH, T4TOTAL, T3FREE, THYROIDAB in the last 72 hours.  Invalid input(s): FREET3  Other results:   Imaging    No results found.   Medications:     Scheduled Medications: . atorvastatin  40 mg Oral q1800  . Chlorhexidine Gluconate Cloth  6 each Topical Daily  . [  START ON 09/04/2020] metoprolol succinate  25 mg Oral Daily  . midodrine  2.5 mg Oral BID WC  . multivitamin with minerals   Oral Once per day on Mon Wed Fri  . pantoprazole  40 mg Oral Daily  . polyethylene glycol  17 g Oral Daily  . sodium chloride flush  10-40 mL Intracatheter Q12H  . sodium chloride flush  10-40 mL Intracatheter Q12H  . sodium chloride flush  3 mL Intravenous Q12H  . tamsulosin  0.4 mg Oral Daily  . Warfarin - Pharmacist Dosing Inpatient   Does not apply q1600    Infusions: . sodium chloride 20 mL/hr at 09/03/20 0803  . milrinone 0.125 mcg/kg/min (09/03/20 0104)    PRN Medications: acetaminophen, ondansetron (ZOFRAN) IV, senna-docusate, sodium chloride  flush, sodium chloride flush   Assessment/Plan   1. 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  Echo from this admission was reviewed, by my interpretation EF 20-25%, moderate RV enlargement with severely decreased RV systolic function, normally functioning bioprosthetic mitral valve with no significant MR and mean gradient 6, moderate TR, dilated IVC.  Patient had normal EF in 12/13 pre-mitral valve replacement.  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  His last cath was in 1/14 pre-surgery, no significant CAD. On [place] 12/10, evidence for severe biventricular failure with PCWP and RA pressure > 30 and PAPi 0.8.  Equalization of diastolic pressures concerning for restrictive physiology.  Cardiac output preserved (CI 2.28 Fick, 2.33 thermo).   It is possible that cardiomyopathy is at least in part tachycardia-mediated given frequent runs of SVT (though rate is relatively slow, 110s for the most part).  Also has history of frequent PVCs.  Cannot rule out coronary disease though no coronary angiography yet with elevated creatinine. He remains on milrinone 0.125 currently with good co-ox 69%.  CVP 4, creatinine mildly lower at 2.17.  - Continue to hold diuretics today.  - Can hold milrinone for SVT ablation later today.  - Eventual coronary angiography to assess for CAD as cause of worsened EF if creatinine stabilizes (no ACS so not urgent).  - No ARNI/ARB/spironolactone/digoxin with elevated creatinine.  - No Bidil for now with soft BP, consider if BP stabilizes. He has been on midodrine 2.5 bid, SBP in 100s.  - Continue Toprol XL 25 mg daily.  - Continue to follow co-ox and CVPs through PICC  - I am concerned that his prognosis is not going to be good given quite significant RV dysfunction.  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  On amiodarone gtt, he has been in and out of NSR, currently back  in SVT. ?Component of tachy-mediated cardiomyopathy.  HR not markedly high when in SVT but he seems to have been predominantly in SVT recently.  - Plan ablation by EP this afternoon. We need to keep in NSR if at all possible given concern for tachy-mediated CMP.  - Amiodarone held for mapping and ablation, can restart afterwards.  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 This may play a role in biventricular failure.  - Watch carefully with milrinone.  - Restart amiodarone after ablation.   4. PAD: h/o right popliteal occlusion. Continue atorvastatin.  5. Atrial fibrillation: Paroxysmal.  Have not seen atrial fibrillation this admission. Has history of CVA.  - On warfarin, INR high so will need to hold tonight.  6. 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  7. AKI on CKD stage 3: Suspect cardiorenal syndrome in setting of RV failure/markedly elevated CVP. Creatinine remains elevated but relatively stable.   . Length of Stay: [place], MD  09/03/2020, 10:13 AM  Advanced Heart Failure Team Pager [phone] (M-F; 7a - 4p)  Please contact [facility] for night-coverage after hours (4p -7a ) and weekends on [email or web address]

## 2020-09-03 NOTE — Progress Notes (Signed)
ANTICOAGULATION CONSULT NOTE - Follow Up Consult  Pharmacy Consult for Coumadin Indication: atrial fibrillation  Allergies  Allergen Reactions  . Tuna [Fish Allergy] Nausea And Vomiting    Patient Measurements: Height: 6' (182.9 cm) Weight: 76.1 kg (167 lb 12.3 oz) IBW/kg (Calculated) : 77.6  Vital Signs: Temp: 97.8 F (36.6 C) (12/15 1504) Temp Source: Tympanic (12/15 1236) BP: 113/69 (12/15 1515) Pulse Rate: 80 (12/15 1504)  Labs: Recent Labs    09/01/20 0229 09/02/20 0218 09/03/20 0500 09/03/20 0651  HGB 11.7* 11.6* 11.5*  --   HCT 35.4* 36.7* 35.9*  --   PLT 106* 103* 106*  --   LABPROT 23.1* 26.2* 30.7*  --   INR 2.1* 2.5* 3.1*  --   CREATININE 2.12* 2.29*  --  2.17*    Estimated Creatinine Clearance: 31.2 mL/min (A) (by C-G formula based on SCr of 2.17 mg/dL (H)).   Medical History: Past Medical History:  Diagnosis Date  . AICD (automatic cardioverter/defibrillator) present   . Atrial fibrillation ([place])   . 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 CHF (congestive heart failure) ([place])   . Diabetes mellitus without complication ([place])   . Endocarditis   . Hypertension   . Peripheral vascular disease ([place])   . PVC's (premature ventricular contractions)   . SVT (supraventricular tachycardia)  long RP     Assessment: 74 yo male presented on 08/25/2020 as a direct admission from EP clinic for significant SOB with minimal exertion, PND, and orthopnea. Patient is s/p bioprosthetic mitral valve repair in 2014. Patient is on warfarin prior to admission for Afib. INR 1.9 on admission.  INR 3 at top of goal.  S/p SVT ablation 12/15 will hold warfarin tonight aim for INR closer to 2.5 to avoid bleeding complications.  Patient was  on amiodarone infusion prior to abalation - now stopped which can potentiate warfarin effects. CBC stable, no bleeding noted.   Prior to admission warfarin regimen: warfarin 5mg  daily   Goal of Therapy:  INR 2-3 Monitor platelets by  anticoagulation protocol: Yes   Plan:  Warfarin hold tonight Monitor INR, CBC and S/S of bleeding daily    [name] Pharm.D. CPP, BCPS Clinical Pharmacist [phone] 09/03/2020 4:33 PM

## 2020-09-03 NOTE — Progress Notes (Addendum)
Electrophysiology Rounding Note  Patient Name: Ryan [name] Date of Encounter: 09/03/2020  Primary Cardiologist: Ryan Grooms, MD Electrophysiologist: Ryan Haw, MD   Subjective   The patient is doing OK this am. At this time, the patient denies chest pain, shortness of breath, or any new concerns.  Inpatient Medications    Scheduled Meds:  atorvastatin  40 mg Oral q1800   Chlorhexidine Gluconate Cloth  6 each Topical Daily   metoprolol succinate  25 mg Oral Daily   midodrine  2.5 mg Oral BID WC   multivitamin with minerals   Oral Once per day on Mon Wed Fri   pantoprazole  40 mg Oral Daily   polyethylene glycol  17 g Oral Daily   sodium chloride flush  10-40 mL Intracatheter Q12H   sodium chloride flush  10-40 mL Intracatheter Q12H   sodium chloride flush  3 mL Intravenous Q12H   tamsulosin  0.4 mg Oral Daily   Warfarin - Pharmacist Dosing Inpatient   Does not apply q1600   Continuous Infusions:  sodium chloride 50 mL/hr at 09/03/20 0701   milrinone 0.125 mcg/kg/min (09/03/20 0104)   PRN Meds: acetaminophen, ondansetron (ZOFRAN) IV, senna-docusate, sodium chloride flush, sodium chloride flush   Vital Signs    Vitals:   09/02/20 2120 09/03/20 0019 09/03/20 0217 09/03/20 0422  BP: 98/67 97/63 96/68  (!) 113/57  Pulse: (!) 116 (!) 115 (!) 57 (!) 57  Resp:  20 16 16   Temp:  99.2 F (37.3 C) 99.3 F (37.4 C) 99 F (37.2 C)  TempSrc:  Oral Oral Oral  SpO2: (!) 89% 94% 93% 95%  Weight:      Height:        Intake/Output Summary (Last 24 hours) at 09/03/2020 0749 Last data filed at 09/03/2020 0728 Gross per 24 hour  Intake 21.91 ml  Output 625 ml  Net -603.09 ml   Filed Weights   08/30/20 0500 08/31/20 0500 09/02/20 0500  Weight: 78 kg 76 kg 76.1 kg    Physical Exam    GEN- The patient is well appearing, alert and oriented x 3 today.   Head- normocephalic, atraumatic Eyes-  Sclera clear, conjunctiva pink Ears- hearing  intact Oropharynx- clear Neck- supple Lungs- Clear to ausculation bilaterally, normal work of breathing Heart- Tachy but regular rate and rhythm, no murmurs, rubs or gallops GI- soft, NT, ND, + BS Extremities- no clubbing or cyanosis. No edema Skin- no rash or lesion Psych- euthymic mood, full affect Neuro- strength and sensation are intact  Labs    CBC Recent Labs    09/02/20 0218 09/03/20 0500  WBC 8.6 8.4  HGB 11.6* 11.5*  HCT 36.7* 35.9*  MCV 71.5* 70.9*  PLT 103* 644*   Basic Metabolic Panel Recent Labs    09/01/20 0229 09/02/20 0218  NA 135 134*  K 3.2* 3.7  CL 93* 92*  CO2 31 29  GLUCOSE 133* 187*  BUN 34* 36*  CREATININE 2.12* 2.29*  CALCIUM 8.8* 8.7*   Liver Function Tests No results for input(s): AST, ALT, ALKPHOS, BILITOT, PROT, ALBUMIN in the last 72 hours. No results for input(s): LIPASE, AMYLASE in the last 72 hours. Cardiac Enzymes No results for input(s): CKTOTAL, CKMB, CKMBINDEX, TROPONINI in the last 72 hours.   Telemetry    SVT 110s. Semi-frequent PVCs including bigeminy at times (personally reviewed)  Radiology    DG CHEST PORT 1 VIEW  Result Date: 09/01/2020 CLINICAL DATA:  74 year old male status  post PICC line placement. EXAM: PORTABLE CHEST 1 VIEW COMPARISON:  08/25/2020 chest radiographs and earlier. FINDINGS: Portable AP semi upright view at 1542 hours. New right upper extremity approach PICC line in place. Tip is at the lower SVC level about 1 vertebral body below the carina. Stable superimposed left chest AICD. Stable cardiac size and mediastinal contours. Prosthetic cardiac valve. Prior sternotomy. Allowing for portable technique the lungs are clear. Visualized tracheal air column is within normal limits. Negative visible bowel gas pattern. IMPRESSION: 1. Right upper extremity approach PICC line placed with tip at the lower SVC level. 2. Cardiomegaly.  No acute cardiopulmonary abnormality. Electronically Signed   By: Ryan [name] M.D.    On: 09/01/2020 15:57   [place] EKG SITE RITE  Result Date: 09/01/2020 If Site Rite image not attached, placement could not be confirmed due to current cardiac rhythm.   Patient Profile      Ryan [name] is a 74 y.o. male with a history of endocarditis s/p mitral valve replacement in 2014, atrial fibrillation/flutter, HTN, DM2, and chronic systolic heart failure. He followed with EP for significant PVC burden (20%) with an EF of 30-35% and accelerated junctional rhythm - status post medtronic ICD.   Assessment & Plan    1. SVT -> ?AVNRT Ryan [name] stop IV amiodarone. Plan for ablation this afternoon.  Stop milrinone on call to EP lab. (BMET pending. Coox stable) Continue BB as tolerated    2. Acute on chronic systolic CHF Appreciate HF team management.  Ryan [name] 08/29/2020 with significantly elevated filling pressures and restrictive physiology.  Cr 2.1 -> 2.29 -> pending Coox 68.9% this am. K pending.    3. PAF On coumadin for CHA2DS2VASC of at least 7 (h/o CVA per chart) INR 3.1 this am. Ryan [name] still plan to proceed with ablation. Hold dose tonight.    4. Hypokalemia BMET pending.    For questions or updates, please contact Ryan [name] Please consult [email or web address] for contact info under Cardiology/STEMI.  Signed, Ryan Friar, PA-C  09/03/2020, 7:49 AM   I have seen and examined this patient with Ryan [name].  Agree with above, note added to reflect my findings.  On exam, tachycardic, regular, no murmurs. Patient with continued SVT. Plan for ablation today. Risks and benefits discussed. Risks include but not limited to bleeding, tamponade, heart block, stroke. The patient understands the risks and has agreed to the procedure.    Traver [name] M. Ryan Gaffin MD 09/03/2020 8:26 AM

## 2020-09-03 NOTE — H&P (View-Only) (Signed)
Electrophysiology Rounding Note  Patient Name: Ryan [name] Date of Encounter: 09/03/2020  Primary Cardiologist: [name], MD Electrophysiologist: Constance Haw, MD   Subjective   The patient is doing OK this am. At this time, the patient denies chest pain, shortness of breath, or any new concerns.  Inpatient Medications    Scheduled Meds:  atorvastatin  40 mg Oral q1800   Chlorhexidine Gluconate Cloth  6 each Topical Daily   metoprolol succinate  25 mg Oral Daily   midodrine  2.5 mg Oral BID WC   multivitamin with minerals   Oral Once per day on Mon Wed Fri   pantoprazole  40 mg Oral Daily   polyethylene glycol  17 g Oral Daily   sodium chloride flush  10-40 mL Intracatheter Q12H   sodium chloride flush  10-40 mL Intracatheter Q12H   sodium chloride flush  3 mL Intravenous Q12H   tamsulosin  0.4 mg Oral Daily   Warfarin - Pharmacist Dosing Inpatient   Does not apply q1600   Continuous Infusions:  sodium chloride 50 mL/hr at 09/03/20 0701   milrinone 0.125 mcg/kg/min (09/03/20 0104)   PRN Meds: acetaminophen, ondansetron (ZOFRAN) IV, senna-docusate, sodium chloride flush, sodium chloride flush   Vital Signs    Vitals:   09/02/20 2120 09/03/20 0019 09/03/20 0217 09/03/20 0422  BP: 98/67 97/63 96/68  (!) 113/57  Pulse: (!) 116 (!) 115 (!) 57 (!) 57  Resp:  20 16 16   Temp:  99.2 F (37.3 C) 99.3 F (37.4 C) 99 F (37.2 C)  TempSrc:  Oral Oral Oral  SpO2: (!) 89% 94% 93% 95%  Weight:      Height:        Intake/Output Summary (Last 24 hours) at 09/03/2020 0749 Last data filed at 09/03/2020 0728 Gross per 24 hour  Intake 21.91 ml  Output 625 ml  Net -603.09 ml   Filed Weights   08/30/20 0500 08/31/20 0500 09/02/20 0500  Weight: 78 kg 76 kg 76.1 kg    Physical Exam    GEN- The patient is well appearing, alert and oriented x 3 today.   Head- normocephalic, atraumatic Eyes-  Sclera clear, conjunctiva pink Ears- hearing  intact Oropharynx- clear Neck- supple Lungs- Clear to ausculation bilaterally, normal work of breathing Heart- Tachy but regular rate and rhythm, no murmurs, rubs or gallops GI- soft, NT, ND, + BS Extremities- no clubbing or cyanosis. No edema Skin- no rash or lesion Psych- euthymic mood, full affect Neuro- strength and sensation are intact  Labs    CBC Recent Labs    09/02/20 0218 09/03/20 0500  WBC 8.6 8.4  HGB 11.6* 11.5*  HCT 36.7* 35.9*  MCV 71.5* 70.9*  PLT 103* 099*   Basic Metabolic Panel Recent Labs    09/01/20 0229 09/02/20 0218  NA 135 134*  K 3.2* 3.7  CL 93* 92*  CO2 31 29  GLUCOSE 133* 187*  BUN 34* 36*  CREATININE 2.12* 2.29*  CALCIUM 8.8* 8.7*   Liver Function Tests No results for input(s): AST, ALT, ALKPHOS, BILITOT, PROT, ALBUMIN in the last 72 hours. No results for input(s): LIPASE, AMYLASE in the last 72 hours. Cardiac Enzymes No results for input(s): CKTOTAL, CKMB, CKMBINDEX, TROPONINI in the last 72 hours.   Telemetry    SVT 110s. Semi-frequent PVCs including bigeminy at times (personally reviewed)  Radiology    DG CHEST PORT 1 VIEW  Result Date: 09/01/2020 CLINICAL DATA:  74 year old male status  post PICC line placement. EXAM: PORTABLE CHEST 1 VIEW COMPARISON:  08/25/2020 chest radiographs and earlier. FINDINGS: Portable AP semi upright view at 1542 hours. New right upper extremity approach PICC line in place. Tip is at the lower SVC level about 1 vertebral body below the carina. Stable superimposed left chest AICD. Stable cardiac size and mediastinal contours. Prosthetic cardiac valve. Prior sternotomy. Allowing for portable technique the lungs are clear. Visualized tracheal air column is within normal limits. Negative visible bowel gas pattern. IMPRESSION: 1. Right upper extremity approach PICC line placed with tip at the lower SVC level. 2. Cardiomegaly.  No acute cardiopulmonary abnormality. Electronically Signed   By: Genevie Ann M.D.    On: 09/01/2020 15:57   [place] EKG SITE RITE  Result Date: 09/01/2020 If Site Rite image not attached, placement could not be confirmed due to current cardiac rhythm.   Patient Profile      Ryan [name] is a 74 y.o. male with a history of endocarditis s/p mitral valve replacement in 2014, atrial fibrillation/flutter, HTN, DM2, and chronic systolic heart failure. He followed with EP for significant PVC burden (20%) with an EF of 30-35% and accelerated junctional rhythm - status post medtronic ICD.   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 Plan for ablation this afternoon.  Stop milrinone on call to EP lab. (BMET pending. Coox stable) Continue BB as tolerated    2. Acute on chronic systolic CHF Appreciate HF team management.  [place] 08/29/2020 with significantly elevated filling pressures and restrictive physiology.  Cr 2.1 -> 2.29 -> pending Coox 68.9% this am. K pending.    3. PAF On coumadin for CHA2DS2VASC of at least 7 (h/o CVA per chart) INR 3.1 this am. Jeliyah Middlebrooks still plan to proceed with ablation. Hold dose tonight.    4. Hypokalemia BMET pending.    For questions or updates, please contact [address] Please consult [email or web address] for contact info under Cardiology/STEMI.  Signed, Shirley Friar, PA-C  09/03/2020, 7:49 AM   I have seen and examined this patient with [name].  Agree with above, note added to reflect my findings.  On exam, tachycardic, regular, no murmurs. Patient with continued SVT. Plan for ablation today. Risks and benefits discussed. Risks include but not limited to bleeding, tamponade, heart block, stroke. The patient understands the risks and has agreed to the procedure.    Kimoni Pagliarulo M. Barnell Shieh MD 09/03/2020 8:26 AM

## 2020-09-03 NOTE — Transfer of Care (Signed)
Immediate Anesthesia Transfer of Care Note  Patient: Ryan [name]  Procedure(s) Performed: SVT ABLATION (N/A )  Patient Location: PACU  Anesthesia Type:MAC  Level of Consciousness: awake  Airway & Oxygen Therapy: Patient Spontanous Breathing  Post-op Assessment: Report given to RN and Post -op Vital signs reviewed and stable  Post vital signs: Reviewed and stable  Last Vitals:  Vitals Value Taken Time  BP    Temp    Pulse    Resp    SpO2      Last Pain:  Vitals:   09/03/20 1236  TempSrc: Tympanic  PainSc:       Patients Stated Pain Goal: 0 (74/14/23 9532)  Complications: No complications documented.

## 2020-09-03 NOTE — Progress Notes (Signed)
Called about right arm swelling. Went to examine patient. Swelling is located on right forearm where IV previously was. Site is 1-2 inches, raised red and firm. Dr. Audie Box also examined patient. Possible ?cellulitis. Will start doxy 100mg  BID for 5 days and check CBC.   Cynia Abruzzo Kathlen Mody, PA-C

## 2020-09-03 NOTE — Anesthesia Postprocedure Evaluation (Signed)
Anesthesia Post Note  Patient: Ryan [name]  Procedure(s) Performed: SVT ABLATION (N/A )     Patient location during evaluation: PACU Anesthesia Type: MAC Level of consciousness: awake and alert Pain management: pain level controlled Vital Signs Assessment: post-procedure vital signs reviewed and stable Respiratory status: spontaneous breathing, nonlabored ventilation, respiratory function stable and patient connected to nasal cannula oxygen Cardiovascular status: stable and blood pressure returned to baseline Postop Assessment: no apparent nausea or vomiting Anesthetic complications: no   No complications documented.  Last Vitals:  Vitals:   09/03/20 1504 09/03/20 1515  BP: 117/72 113/69  Pulse: 80   Resp: (!) 25   Temp: 36.6 C   SpO2: 98%     Last Pain:  Vitals:   09/03/20 1504  TempSrc:   PainSc: [place]

## 2020-09-03 NOTE — Progress Notes (Signed)
Patient complaining of forearm pain and swelling.  Red, raised area noted.  Arm elevated on pillow.  Cadence Kathlen Mody, [place] notified and aware.  States will place new orders and come assess site.

## 2020-09-03 NOTE — Plan of Care (Signed)
  Problem: Education: Goal: Knowledge of General Education information will improve Description: Including pain rating scale, medication(s)/side effects and non-pharmacologic comfort measures Outcome: Progressing   Problem: Clinical Measurements: Goal: Ability to maintain clinical measurements within normal limits will improve Outcome: Progressing Goal: Diagnostic test results will improve Outcome: Progressing   

## 2020-09-03 NOTE — Anesthesia Preprocedure Evaluation (Addendum)
Anesthesia Evaluation  Patient identified by MRN, date of birth, ID band Patient awake    Reviewed: Allergy & Precautions, NPO status , Patient's Chart, lab work & pertinent test results  Airway Mallampati: I  TM Distance: >3 FB Neck ROM: Full    Dental  (+) Upper Dentures, Partial Lower, Dental Advisory Given   Pulmonary neg pulmonary ROS,    breath sounds clear to auscultation       Cardiovascular hypertension, Pt. on medications + Peripheral Vascular Disease and +CHF  + dysrhythmias Atrial Fibrillation and Supra Ventricular Tachycardia + Cardiac Defibrillator  Rhythm:Regular Rate:Normal     Neuro/Psych CVA negative psych ROS   GI/Hepatic Neg liver ROS, GERD  Medicated,  Endo/Other  diabetes  Renal/GU      Musculoskeletal negative musculoskeletal ROS (+)   Abdominal Normal abdominal exam  (+)   Peds  Hematology negative hematology ROS (+)   Anesthesia Other Findings   Reproductive/Obstetrics                             Anesthesia Physical Anesthesia Plan  ASA: IV  Anesthesia Plan: MAC   Post-op Pain Management:    Induction: Intravenous  PONV Risk Score and Plan: 2 and Ondansetron and Treatment may vary due to age or medical condition  Airway Management Planned: Simple Face Mask and Natural Airway  Additional Equipment: None  Intra-op Plan:   Post-operative Plan:   Informed Consent: I have reviewed the patients History and Physical, chart, labs and discussed the procedure including the risks, benefits and alternatives for the proposed anesthesia with the patient or authorized representative who has indicated his/her understanding and acceptance.     Dental advisory given  Plan Discussed with: CRNA  Anesthesia Plan Comments: (Echo:  1. There is substantial LV setal-lateral dyssynchrony. Left ventricular  ejection fraction, by estimation, is 25 to 30%. The left  ventricle has  severely decreased function. The left ventricle demonstrates global  hypokinesis. Left ventricular diastolic  function could not be evaluated.  2. Right ventricular systolic function is severely reduced. The right  ventricular size is severely enlarged. There is moderately elevated  pulmonary artery systolic pressure.  3. Left atrial size was moderately dilated.  4. Right atrial size was severely dilated.  5. The mitral valve has been repaired/replaced. No evidence of mitral  valve regurgitation. No evidence of mitral stenosis. The mean mitral valve  gradient is 6.1 mmHg with average heart rate of 121 bpm. There is a  bioprosthetic valve present in the  mitral position.  6. Tricuspid valve regurgitation is moderate.  7. The aortic valve is tricuspid. Aortic valve regurgitation is not  visualized. No aortic stenosis is present.  8. There is borderline dilatation of the ascending aorta, measuring 37  mm.  9. The inferior vena cava is dilated in size with <50% respiratory  variability, suggesting right atrial pressure of 15 mmHg. )      Anesthesia Quick Evaluation

## 2020-09-03 NOTE — Progress Notes (Signed)
RN notified now that patient converted to SR at 12pm.

## 2020-09-03 NOTE — Interval H&P Note (Signed)
History and Physical Interval Note:  09/03/2020 8:28 AM  Linna Hoff  has presented today for surgery, with the diagnosis of svt.  The various methods of treatment have been discussed with the patient and family. After consideration of risks, benefits and other options for treatment, the patient has consented to  Procedure(s): SVT ABLATION (N/A) as a surgical intervention.  The patient's history has been reviewed, patient examined, no change in status, stable for surgery.  I have reviewed the patient's chart and labs.  Questions were answered to the patient's satisfaction.     Chrystie Hagwood [employer]

## 2020-09-04 ENCOUNTER — Encounter (HOSPITAL_COMMUNITY): Payer: Self-pay | Admitting: Cardiology

## 2020-09-04 LAB — COOXEMETRY PANEL
Carboxyhemoglobin: 1.7 % — ABNORMAL HIGH (ref 0.5–1.5)
Methemoglobin: 0.9 % (ref 0.0–1.5)
O2 Saturation: 79.6 %
Total hemoglobin: 11.2 g/dL — ABNORMAL LOW (ref 12.0–16.0)

## 2020-09-04 LAB — BASIC METABOLIC PANEL
Anion gap: 10 (ref 5–15)
BUN: 40 mg/dL — ABNORMAL HIGH (ref 8–23)
CO2: 29 mmol/L (ref 22–32)
Calcium: 8.4 mg/dL — ABNORMAL LOW (ref 8.9–10.3)
Chloride: 96 mmol/L — ABNORMAL LOW (ref 98–111)
Creatinine, Ser: 1.92 mg/dL — ABNORMAL HIGH (ref 0.61–1.24)
GFR, Estimated: 36 mL/min — ABNORMAL LOW (ref >60)
Glucose, Bld: 109 mg/dL — ABNORMAL HIGH (ref 70–99)
Potassium: 3.6 mmol/L (ref 3.5–5.1)
Sodium: 135 mmol/L (ref 135–145)

## 2020-09-04 LAB — CBC
HCT: 32.3 % — ABNORMAL LOW (ref 39.0–52.0)
Hemoglobin: 10.8 g/dL — ABNORMAL LOW (ref 13.0–17.0)
MCH: 23.4 pg — ABNORMAL LOW (ref 26.0–34.0)
MCHC: 33.4 g/dL (ref 30.0–36.0)
MCV: 70.1 fL — ABNORMAL LOW (ref 80.0–100.0)
Platelets: 113 10*3/uL — ABNORMAL LOW (ref 150–400)
RBC: 4.61 MIL/uL (ref 4.22–5.81)
RDW: 17.7 % — ABNORMAL HIGH (ref 11.5–15.5)
WBC: 7.9 10*3/uL (ref 4.0–10.5)
nRBC: 0 % (ref 0.0–0.2)

## 2020-09-04 LAB — PROTIME-INR
INR: 3.1 — ABNORMAL HIGH (ref 0.8–1.2)
Prothrombin Time: 30.7 seconds — ABNORMAL HIGH (ref 11.4–15.2)

## 2020-09-04 MED ORDER — AMIODARONE HCL 200 MG PO TABS
200.0000 mg | ORAL_TABLET | Freq: Every day | ORAL | Status: DC
  Administered 2020-09-04 – 2020-09-06 (×3): 200 mg via ORAL
  Filled 2020-09-04 (×3): qty 1

## 2020-09-04 MED ORDER — WARFARIN SODIUM 2.5 MG PO TABS
2.5000 mg | ORAL_TABLET | Freq: Once | ORAL | Status: AC
  Administered 2020-09-04: 2.5 mg via ORAL
  Filled 2020-09-04: qty 1

## 2020-09-04 MED ORDER — DAPAGLIFLOZIN PROPANEDIOL 10 MG PO TABS
10.0000 mg | ORAL_TABLET | Freq: Every day | ORAL | Status: DC
  Administered 2020-09-04: 10 mg via ORAL
  Filled 2020-09-04 (×2): qty 1

## 2020-09-04 MED FILL — Cefazolin Sodium-Dextrose IV Solution 2 GM/100ML-4%: INTRAVENOUS | Qty: 100 | Status: AC

## 2020-09-04 NOTE — NC FL2 (Signed)
[place] LEVEL OF CARE SCREENING TOOL     IDENTIFICATION  Patient Name: Ryan [name] Birthdate: 03/03/1946 Sex: male Admission Date (Current Location): 08/25/2020  [facility] and [place] Number:  Herbalist and Address:  [facility] [place] [address] [place], [place] [address]      Provider Number: [id number]  Attending Physician Name and Address:  Skeet Latch, MD  Relative Name and Phone Number:  [name] [phone]    Current Level of Care: Hospital Recommended Level of Care: [place] Prior Approval Number:    Date Approved/Denied:   PASRR Number: [id number]  Discharge Plan: SNF    Current Diagnoses: Patient Active Problem List   Diagnosis Date Noted  . CHF (congestive heart failure) ([place]) 08/26/2020  . S/P MVR (mitral valve repair)   . Acute combined systolic and diastolic CHF, NYHA class 3 ([place])   . Stage 3 chronic kidney disease ([place])   . 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  . PVC's (premature ventricular contractions) 06/12/2016  . 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 SVT (supraventricular tachycardia)  long RP   . Atherosclerosis of native arteries of the extremities with ulceration ([place]) 10/23/2012  . Essential hypertension 09/29/2012  . Diabetes mellitus ([place]) 09/28/2012  . Status post mitral valve replacement with bioprosthetic valve 09/19/2012  . Acute pericarditis 09/16/2012  . Atrial fibrillation ([place]) 09/16/2012  . Chronic systolic heart failure ([place]) 09/16/2012  . Peripheral vascular disease ([place]) 09/15/2012  . Acute ischemic stroke ([place]) 09/15/2012    Orientation RESPIRATION BLADDER Height & Weight     Self,Time,Situation,Place  Normal Continent Weight: 167 lb 8.8 oz (76 kg) Height:  6' (182.9 cm)  BEHAVIORAL SYMPTOMS/MOOD NEUROLOGICAL BOWEL NUTRITION STATUS      Continent Diet (See Discharge Summary)  AMBULATORY STATUS COMMUNICATION OF  NEEDS Skin   Limited Assist Verbally Other (Comment) (Ecchymosis arm;leg;right)                       Personal Care Assistance Level of Assistance  Bathing,Feeding,Dressing Bathing Assistance: Limited assistance Feeding assistance: Independent (able to feed self;Cardiac;Carb modified) Dressing Assistance: Limited assistance     Functional Limitations Info  Sight,Hearing,Speech Sight Info: Adequate Hearing Info: Adequate Speech Info: Adequate    SPECIAL CARE FACTORS FREQUENCY  PT (By licensed PT),OT (By licensed OT)     PT Frequency: 5x min weekly OT Frequency: 5x min weekly            Contractures Contractures Info: Not present    Additional Factors Info  Code Status,Allergies Code Status Info: FULL Allergies Info: Tuna (fish Allergy)           Current Medications (09/04/2020):  This is the current hospital active medication list Current Facility-Administered Medications  Medication Dose Route Frequency Provider Last Rate Last Admin  . 0.9 %  sodium chloride infusion   Intravenous Continuous Shirley Friar, PA-C 20 mL/hr at 09/03/20 0160 Infusion Verify at 09/03/20 0803  . 0.9 %  sodium chloride infusion  250 mL Intravenous PRN Camnitz, Will Hassell Done, MD      . acetaminophen (TYLENOL) tablet 650 mg  650 mg Oral Q4H PRN Shirley Friar, PA-C   650 mg at 09/04/20 1313  . amiodarone (PACERONE) tablet 200 mg  200 mg Oral Daily Shirley Friar, PA-C   200 mg at 09/04/20 0854  . atorvastatin (LIPITOR) tablet 40 mg  40 mg Oral q1800 Shirley Friar, PA-C  40 mg at 09/03/20 1718  . Chlorhexidine Gluconate Cloth 2 % PADS 6 each  6 each Topical Daily Shirley Friar, PA-C   6 each at 09/03/20 1702  . dapagliflozin propanediol (FARXIGA) tablet 10 mg  10 mg Oral Daily Larey Dresser, MD      . doxycycline (VIBRA-TABS) tablet 100 mg  100 mg Oral Q12H Furth, Cadence H, PA-C   100 mg at 09/03/20 2142  . metoprolol succinate (TOPROL-XL)  24 hr tablet 25 mg  25 mg Oral Daily Shirley Friar, PA-C   25 mg at 09/04/20 0853  . multivitamin with minerals tablet   Oral Once per day on Mon Wed Fri Shirley Friar, PA-C   1 tablet at 09/03/20 1034  . ondansetron (ZOFRAN) injection 4 mg  4 mg Intravenous Q6H PRN Shirley Friar, PA-C   4 mg at 08/28/20 0946  . pantoprazole (PROTONIX) EC tablet 40 mg  40 mg Oral Daily Shirley Friar, PA-C   40 mg at 09/04/20 0853  . polyethylene glycol (MIRALAX / GLYCOLAX) packet 17 g  17 g Oral Daily Shirley Friar, PA-C   17 g at 09/04/20 0854  . senna-docusate (Senokot-S) tablet 1 tablet  1 tablet Oral BID PRN Shirley Friar, PA-C   1 tablet at 08/28/20 1138  . sodium chloride flush (NS) 0.9 % injection 10-40 mL  10-40 mL Intracatheter Q12H Shirley Friar, PA-C   10 mL at 09/04/20 0445  . sodium chloride flush (NS) 0.9 % injection 10-40 mL  10-40 mL Intracatheter PRN Shirley Friar, PA-C      . sodium chloride flush (NS) 0.9 % injection 10-40 mL  10-40 mL Intracatheter Q12H Shirley Friar, PA-C   10 mL at 09/01/20 2127  . sodium chloride flush (NS) 0.9 % injection 10-40 mL  10-40 mL Intracatheter PRN Shirley Friar, PA-C      . sodium chloride flush (NS) 0.9 % injection 3 mL  3 mL Intravenous Q12H Shirley Friar, PA-C   3 mL at 09/03/20 1038  . sodium chloride flush (NS) 0.9 % injection 3 mL  3 mL Intravenous Q12H Camnitz, Will Hassell Done, MD      . sodium chloride flush (NS) 0.9 % injection 3 mL  3 mL Intravenous PRN Camnitz, Will Hassell Done, MD      . tamsulosin (FLOMAX) capsule 0.4 mg  0.4 mg Oral Daily Shirley Friar, PA-C   0.4 mg at 09/04/20 0853  . warfarin (COUMADIN) tablet 2.5 mg  2.5 mg Oral ONCE-1600 Skeet Latch, MD      . Warfarin - Pharmacist Dosing Inpatient   Does not apply [id number] Shirley Friar, PA-C         Discharge Medications: Please see discharge summary for a list of  discharge medications.  Relevant Imaging Results:  Relevant Lab Results:   Additional Information [phone]  Trula Ore, LCSWA

## 2020-09-04 NOTE — TOC Initial Note (Signed)
Transition of Care [facility]) - Initial/Assessment Note    Patient Details  Name: Ryan [name] MRN: [id number] Date of Birth: 1946/04/28  [facility] CM/SW Contact:    Trula Ore, [place] Phone Number: 09/04/2020, 4:07 PM  Clinical Narrative:                  CSW received consult for possible SNF placement at time of discharge. CSW spoke with patient at bedside regarding PT recommendation of SNF placement at time of discharge. Patient comes from home alone.Patient expressed understanding of PT recommendation and is agreeable to SNF placement at time of discharge. Patient gave CSW permission to fax out initial referral near [place] area.Patient has received the COVID vaccines, and would like to receive the booster vaccine. CSW informed MD. Patient gave CSW permission to discuss his care with his son Ryan [name]. No further questions reported at this time. CSW to continue to follow and assist with discharge planning needs.   Expected Discharge Plan: Skilled Nursing Facility Barriers to Discharge: Continued Medical Work up   Patient Goals and CMS Choice Patient states their goals for this hospitalization and ongoing recovery are:: SNF CMS Medicare.gov Compare Post Acute Care list provided to:: Patient Choice offered to / list presented to : Patient  Expected Discharge Plan and Services Expected Discharge Plan: [place]       Living arrangements for the past 2 months: Single Family Home                                      Prior Living Arrangements/Services Living arrangements for the past 2 months: Single Family Home Lives with:: Self Patient language and need for interpreter reviewed:: Yes Do you feel safe going back to the place where you live?: No   SNF  Need for Family Participation in Patient Care: Yes (Comment) Care giver support system in place?: Yes (comment)   Criminal Activity/Legal Involvement Pertinent to Current  Situation/Hospitalization: No - Comment as needed  Activities of Daily Living Home Assistive Devices/Equipment: Cane (specify quad or straight),Walker (specify type) ADL Screening (condition at time of admission) Patient's cognitive ability adequate to safely complete daily activities?: Yes Is the patient deaf or have difficulty hearing?: No Does the patient have difficulty seeing, even when wearing glasses/contacts?: No Does the patient have difficulty concentrating, remembering, or making decisions?: No Patient able to express need for assistance with ADLs?: Yes Does the patient have difficulty dressing or bathing?: No Independently performs ADLs?: Yes (appropriate for developmental age) Does the patient have difficulty walking or climbing stairs?: Yes Weakness of Legs: Both Weakness of Arms/Hands: None  Permission Sought/Granted Permission sought to share information with : Case Manager,Family Chief Financial Officer Permission granted to share information with : Yes, Verbal Permission Granted  Share Information with NAME: Ryan [name]  Permission granted to share info w AGENCY: SNF  Permission granted to share info w Relationship: son  Permission granted to share info w Contact Information: Ryan [name] [phone]  Emotional Assessment Appearance:: Appears stated age Attitude/Demeanor/Rapport: Gracious Affect (typically observed): Calm Orientation: : Oriented to Self,Oriented to Place,Oriented to  Time,Oriented to Situation Alcohol / Substance Use: Not Applicable Psych Involvement: No (comment)  Admission diagnosis:  CHF (congestive heart failure) ([place]) [I50.9] Persistent atrial fibrillation ([place]) [I48.19] Acute on chronic congestive heart failure, unspecified heart failure type [facility]) [I50.9] Patient Active Problem List   Diagnosis Date Noted  .  CHF (congestive heart failure) ([place]) 08/26/2020  . S/P MVR (mitral valve repair)   . Acute combined systolic and  diastolic CHF, NYHA class 3 ([place])   . Stage 3 chronic kidney disease ([place])   . 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  . PVC's (premature ventricular contractions) 06/12/2016  . 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 SVT (supraventricular tachycardia)  long RP   . Atherosclerosis of native arteries of the extremities with ulceration ([place]) 10/23/2012  . Essential hypertension 09/29/2012    Class: Chronic  . Diabetes mellitus ([place]) 09/28/2012  . Status post mitral valve replacement with bioprosthetic valve 09/19/2012    Class: Acute  . Acute pericarditis 09/16/2012    Class: Acute  . Atrial fibrillation ([place]) 09/16/2012    Class: Acute  . Chronic systolic heart failure ([place]) 09/16/2012    Class: Acute  . Peripheral vascular disease ([place]) 09/15/2012  . Acute ischemic stroke ([place]) 09/15/2012   PCP:  [name], MD Pharmacy:   [id number] - [place], [place] [address] Phone: [phone] Fax: [phone]     Social Determinants of Health (SDOH) Interventions    Readmission Risk Interventions No flowsheet data found.

## 2020-09-04 NOTE — Progress Notes (Addendum)
Electrophysiology Rounding Note  Patient Name: [name] Date of Encounter: 09/04/2020  Primary Cardiologist: [name], MD Electrophysiologist: Constance Haw, MD   Subjective   The patient is doing well today.  At this time, the patient denies chest pain, shortness of breath, or any new concerns.  Remains in NSR 70-80s s/p SVT ablation 09/03/2020.  Inpatient Medications    Scheduled Meds:  amiodarone  200 mg Oral Daily   atorvastatin  40 mg Oral q1800   Chlorhexidine Gluconate Cloth  6 each Topical Daily   doxycycline  100 mg Oral Q12H   metoprolol succinate  25 mg Oral Daily   midodrine  2.5 mg Oral BID WC   multivitamin with minerals   Oral Once per day on Mon Wed Fri   pantoprazole  40 mg Oral Daily   polyethylene glycol  17 g Oral Daily   sodium chloride flush  10-40 mL Intracatheter Q12H   sodium chloride flush  10-40 mL Intracatheter Q12H   sodium chloride flush  3 mL Intravenous Q12H   sodium chloride flush  3 mL Intravenous Q12H   tamsulosin  0.4 mg Oral Daily   Warfarin - Pharmacist Dosing Inpatient   Does not apply q1600   Continuous Infusions:  sodium chloride 20 mL/hr at 09/03/20 0803   sodium chloride     milrinone 0.125 mcg/kg/min (09/04/20 0727)   PRN Meds: sodium chloride, acetaminophen, ondansetron (ZOFRAN) IV, senna-docusate, sodium chloride flush, sodium chloride flush, sodium chloride flush   Vital Signs    Vitals:   09/03/20 1755 09/03/20 1825 09/03/20 2136 09/04/20 0525  BP: 106/76 (!) 123/51 113/79 112/68  Pulse: 86 85 (!) 30 83  Resp:   18 18  Temp:   99 F (37.2 C) 98.3 F (36.8 C)  TempSrc:   Oral Oral  SpO2: 100% 98% 100% 94%  Weight:    76 kg  Height:        Intake/Output Summary (Last 24 hours) at 09/04/2020 0809 Last data filed at 09/04/2020 0726 Gross per 24 hour  Intake 855.25 ml  Output 400 ml  Net 455.25 ml   Filed Weights   08/31/20 0500 09/02/20 0500 09/04/20 0525  Weight: 76 kg 76.1 kg 76  kg    Physical Exam    GEN- The patient is well appearing, alert and oriented x 3 today.   Head- normocephalic, atraumatic Eyes-  Sclera clear, conjunctiva pink Ears- hearing intact Oropharynx- clear Neck- supple Lungs- Clear to ausculation bilaterally, normal work of breathing Heart- Regular rate and rhythm, no murmurs, rubs or gallops GI- soft, NT, ND, + BS Extremities- no clubbing or cyanosis. No edema Skin- no rash or lesion Psych- euthymic mood, full affect Neuro- strength and sensation are intact  Labs    CBC Recent Labs    09/03/20 1939 09/04/20 0453  WBC 8.5 7.9  HGB 12.1* 10.8*  HCT 36.9* 32.3*  MCV 70.0* 70.1*  PLT 109* 259*   Basic Metabolic Panel Recent Labs    09/03/20 0651 09/04/20 0453  NA 134* 135  K 3.8 3.6  CL 93* 96*  CO2 29 29  GLUCOSE 110* 109*  BUN 42* 40*  CREATININE 2.17* 1.92*  CALCIUM 8.4* 8.4*   Liver Function Tests No results for input(s): AST, ALT, ALKPHOS, BILITOT, PROT, ALBUMIN in the last 72 hours. No results for input(s): LIPASE, AMYLASE in the last 72 hours. Cardiac Enzymes No results for input(s): CKTOTAL, CKMB, CKMBINDEX, TROPONINI in the last  72 hours.   Telemetry    NSR 70-80s (personally reviewed)  Radiology    EP STUDY  Result Date: 09/03/2020 SURGEON: Allegra Lai, MD PREPROCEDURE DIAGNOSIS: SVT POSTPROCEDURE DIAGNOSIS: Classic AV nodal reentrant tachycardia PROCEDURES: 1. Comprehensive EP study. 2. Coronary sinus pacing and recording. 3.  Three-dimensional mapping of supraventricular tachycardia. 4. Radiofrequency ablation of supraventricular tachycardia. 5. Arrhythmia induction with isuprel infused INTRODUCTION: Ryan [name] is a 74 y.o. male with a history of symptomatic recurrent short RP SVT who presents today for EP study and radiofrequency ablation. The patient has had recurrent symptomatic SVT. She has failed medical therapy with verapamil. She now presents for EP study and radiofrequency ablation of  SVT. DESCRIPTION OF PROCEDURE: Informed written consent was obtained and the patient was brought to the Electrophysiology Lab in the fasting state. The patient was adequately sedated with intravenous medication as outlined in the anesthesia report. The patient's right neck and groin was prepped and draped in the usual sterile fashion by the EP Lab staff. Using a percutaneous Seldinger technique, one 8 [place] and one 6 [place] hemostasis sheath was placed in the right femoral vein, one 7 [place] and one 6 [place] hemostasis sheath was placed in the left femoral vein.  Two 6-French quadripolar Josephson catheters were introduced through the right and left common femoral vein and advanced into the His bundle and right ventricular apex positions for recording and pacing.  A Biosense Webster auto ID CS catheter was placed in the coronary sinus through the left femoral vein.  Direct ultrasound guidance is used for right and left femoral veins with normal vessel patency. Ultrasound images are captured and stored in the patient's chart. Using ultrasound guidance, the Brockenbrough needle and wire were visualized entering the vessel. Presenting Measurements: The patient presented to the Electrophysiology Lab in sinus rhythm. The PR interval was 257 msec with a QRS of 103 msec and a Qt of 481 msec. The average RR interval was 808 msec. The AH interval was 129 msec and the HV interval was 51 milliseconds. EP study: Ventricular pacing was performed which reveals midline concentric decremental VA conduction with a single retrograde jump but no echo beats of tachycardias. VA pacing showed conduction at 600 msec. The patient went into SVT spontaneously.  The tachycardia cycle length was 540 msec. The surface EKG was consistent with the patient's clinical tachycardia. VA time during tachycardia measured 30 msec with earliest retrograde atrial activation recorded from the His electrogram. The tachycardia terminated spontaneously with  an atrial activation. This was a reproducible event. V pacing was performed during tachycardia which revealed a VAV response.  Atrial pacing was performed with AVA response. The patient was therefore felt to have classic AV nodal reentrant tachycardia. I therefore elected to perform slow pathway modification. Isuprel was discontinued and allowed to washout.   A Ablation: A 8F Biosense Webster DF 7mm ablation catheter was therefore advanced through the right femoral vein and advanced into the right atrium.  Three-dimensional mapping of Koch's triangle was performed which revealed a standard sized triangle. A single lesion was delivered at site 9 in Koch's triangle with a target temperature of 60 degrees of 50 watts for 60 seconds. Good accelerated junctional rhythm was observed with intact VA conduction. A second RF lesion was applied at site 8 in Koch's triangle. Accelerated junctional rhythm was again observed however RF was discontinued after 24 seconds due to a single dropped retrograde beat. Measurements following ablation: Following ablation, Rapid atrial pacing was again performed  with PR<RR and an AV WCL of 490 msec. AEST was performed which revealed no AH jumps, echo beats, or tachycardias induced. No arrhythmias were induced. Following ablation the AH interval was 215 msec with an HV interval of 46 msec. The procedure was therefore considered completed. All catheters were removed and the sheaths were aspirated and flushed. The sheaths were removed and hemostasis was assured. EBL<61ml. There were no early apparent complications. CONCLUSIONS: 1. Sinus rhythm upon presentation. 2. The patient had dual AV nodal physiology with easily inducible classic AV nodal reentrant tachycardia, there were no other accessory pathways or arrhythmias induced 3. Successful radiofrequency modification of the slow AV nodal pathway 4. No inducible arrhythmias following ablation. 5. No early apparent complications.    Patient  Profile     TREJUAN MATHERNE is a 74 y.o. male with a history of endocarditis s/p mitral valve replacement in 2014, atrial fibrillation/flutter, HTN, DM2, and chronic systolic heart failure. He followed with EP for significant PVC burden (20%) with an EF of 30-35% and accelerated junctional rhythm - status post medtronic ICD.   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 [name] 09/03/2020 Continue BB as tolerated    2. Acute on chronic systolic CHF Appreciate HF team management.  [place] 08/29/2020 with significantly elevated filling pressures and restrictive physiology.  Cr 2.1 -> 2.29 -> 2.17 -> 1.92 Coox 79.6% this am ? Accuracy of if normal rhythm greatly helped output. K 3.6.  Stopping milrinone per CHF team.    3. PAF On coumadin for CHA2DS2VASC of at least 7 (h/o CVA per chart) INR 3.1 this am. Asley [place] still plan to proceed with ablation. Hold dose tonight.    4. Hypokalemia K 3.6 this am. Goal >4.0  5. 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  Lavra Imler resume amiodarone at 200 mg daily for now.   We Darnesha Diloreto schedule outpatient EP follow up and place in AVS  For questions or updates, please contact [place] Please consult [email or web address] for contact info under Cardiology/STEMI.  Signed, Shirley Friar, PA-C  09/04/2020, 8:09 AM   I have seen and examined this patient with [name].  Agree with above, note added to reflect my findings.  On exam, RRR, no murmurs.  Patient is status post ablation yesterday for AVNRT.  He has had no further arrhythmias overnight.  We Gabrial Domine arrange for follow-up in clinic.  Discussed with him restrictions post ablation.  EP to sign off.  Please let us know if there are any further issues.  Doroteo Nickolson M. Tareva Leske MD 09/04/2020 8:54 AM

## 2020-09-04 NOTE — Care Management Important Message (Signed)
Important Message  Patient Details  Name: Ryan [name] MRN: [id number] Date of Birth: 11/27/1945   Medicare Important Message Given:  Yes     Shelda Altes 09/04/2020, 12:05 PM

## 2020-09-04 NOTE — Progress Notes (Addendum)
Patient ID: Ryan [name], male   DOB: 08-17-1946, 74 y.o.   MRN: [id number]     Advanced Heart Failure Rounding Note  PCP-Cardiologist: [name], MD   Subjective:   S/P SVT ablation. In SR in 70s   Remains on milrinone 0.125 mcg. CO-OX 80%. CVP 2-3    Creatinine 2.17>1.9     Denies SOB. Denies orthopnea.     Objective:   Weight Range: 76 kg Body mass index is 22.72 kg/m.   Vital Signs:   Temp:  [97.6 F (36.4 C)-99 F (37.2 C)] 98.3 F (36.8 C) (12/16 0525) Pulse Rate:  [30-86] 83 (12/16 0525) Resp:  [15-25] 18 (12/16 0525) BP: (106-123)/(51-79) 112/68 (12/16 0525) SpO2:  [94 %-100 %] 94 % (12/16 0525) Weight:  [76 kg] 76 kg (12/16 0525) Last BM Date: 09/01/20  Weight change: Filed Weights   08/31/20 0500 09/02/20 0500 09/04/20 0525  Weight: 76 kg 76.1 kg 76 kg    Intake/Output:   Intake/Output Summary (Last 24 hours) at 09/04/2020 0729 Last data filed at 09/04/2020 0726 Gross per 24 hour  Intake 855.25 ml  Output 400 ml  Net 455.25 ml      Physical Exam   CVP 2-3  General:  Well appearing. No resp difficulty HEENT: normal Neck: supple. no JVD. Carotids 2+ bilat; no bruits. No lymphadenopathy or thryomegaly appreciated. Cor: PMI nondisplaced. Regular rate & rhythm. No rubs, gallops or murmurs. Lungs: clear Abdomen: soft, nontender, nondistended. No hepatosplenomegaly. No bruits or masses. Good bowel sounds. Extremities: no cyanosis, clubbing, rash, edema. RUE indurated on forearm. RUE PICC Neuro: alert & orientedx3, cranial nerves grossly intact. moves all 4 extremities w/o difficulty. Affect pleasant   Telemetry   SR 70s with occasional PVCs.    Labs    CBC Recent Labs    09/03/20 1939 09/04/20 0453  WBC 8.5 7.9  HGB 12.1* 10.8*  HCT 36.9* 32.3*  MCV 70.0* 70.1*  PLT 109* 160*   Basic Metabolic Panel Recent Labs    09/03/20 0651 09/04/20 0453  NA 134* 135  K 3.8 3.6  CL 93* 96*  CO2 29 29  GLUCOSE 110* 109*  BUN  42* 40*  CREATININE 2.17* 1.92*  CALCIUM 8.4* 8.4*   Liver Function Tests No results for input(s): AST, ALT, ALKPHOS, BILITOT, PROT, ALBUMIN in the last 72 hours. No results for input(s): LIPASE, AMYLASE in the last 72 hours. Cardiac Enzymes No results for input(s): CKTOTAL, CKMB, CKMBINDEX, TROPONINI in the last 72 hours.  BNP: BNP (last 3 results) Recent Labs    08/26/20 0622  BNP 2,235.1*    ProBNP (last 3 results) Recent Labs    08/25/20 1121  PROBNP 21,395*     D-Dimer No results for input(s): DDIMER in the last 72 hours. Hemoglobin A1C No results for input(s): HGBA1C in the last 72 hours. Fasting Lipid Panel No results for input(s): CHOL, HDL, LDLCALC, TRIG, CHOLHDL, LDLDIRECT in the last 72 hours. Thyroid Function Tests No results for input(s): TSH, T4TOTAL, T3FREE, THYROIDAB in the last 72 hours.  Invalid input(s): FREET3  Other results:   Imaging    EP STUDY  Result Date: 09/03/2020 SURGEON: Allegra Lai, MD PREPROCEDURE DIAGNOSIS: SVT POSTPROCEDURE DIAGNOSIS: Classic AV nodal reentrant tachycardia PROCEDURES: 1. Comprehensive EP study. 2. Coronary sinus pacing and recording. 3.  Three-dimensional mapping of supraventricular tachycardia. 4. Radiofrequency ablation of supraventricular tachycardia. 5. Arrhythmia induction with isuprel infused INTRODUCTION: [name] is a 74 y.o. male with a  history of symptomatic recurrent short RP SVT who presents today for EP study and radiofrequency ablation. The patient has had recurrent symptomatic SVT. She has failed medical therapy with verapamil. She now presents for EP study and radiofrequency ablation of SVT. DESCRIPTION OF PROCEDURE: Informed written consent was obtained and the patient was brought to the Electrophysiology Lab in the fasting state. The patient was adequately sedated with intravenous medication as outlined in the anesthesia report. The patient's right neck and groin was prepped and draped in the  usual sterile fashion by the EP Lab staff. Using a percutaneous Seldinger technique, one 8 [place] and one 6 [place] hemostasis sheath was placed in the right femoral vein, one 7 [place] and one 6 [place] hemostasis sheath was placed in the left femoral vein.  Two 6-French quadripolar Josephson catheters were introduced through the right and left common femoral vein and advanced into the His bundle and right ventricular apex positions for recording and pacing.  A Biosense Webster auto ID CS catheter was placed in the coronary sinus through the left femoral vein.  Direct ultrasound guidance is used for right and left femoral veins with normal vessel patency. Ultrasound images are captured and stored in the patient's chart. Using ultrasound guidance, the Brockenbrough needle and wire were visualized entering the vessel. Presenting Measurements: The patient presented to the Electrophysiology Lab in sinus rhythm. The PR interval was 257 msec with a QRS of 103 msec and a Qt of 481 msec. The average RR interval was 808 msec. The AH interval was 129 msec and the HV interval was 51 milliseconds. EP study: Ventricular pacing was performed which reveals midline concentric decremental VA conduction with a single retrograde jump but no echo beats of tachycardias. VA pacing showed conduction at 600 msec. The patient went into SVT spontaneously.  The tachycardia cycle length was 540 msec. The surface EKG was consistent with the patient's clinical tachycardia. VA time during tachycardia measured 30 msec with earliest retrograde atrial activation recorded from the His electrogram. The tachycardia terminated spontaneously with an atrial activation. This was a reproducible event. V pacing was performed during tachycardia which revealed a VAV response.  Atrial pacing was performed with AVA response. The patient was therefore felt to have classic AV nodal reentrant tachycardia. I therefore elected to perform slow pathway modification.  Isuprel was discontinued and allowed to washout.   A Ablation: A 41F Biosense Webster DF 31mm ablation catheter was therefore advanced through the right femoral vein and advanced into the right atrium.  Three-dimensional mapping of Koch's triangle was performed which revealed a standard sized triangle. A single lesion was delivered at site 9 in Koch's triangle with a target temperature of 60 degrees of 50 watts for 60 seconds. Good accelerated junctional rhythm was observed with intact VA conduction. A second RF lesion was applied at site 8 in Koch's triangle. Accelerated junctional rhythm was again observed however RF was discontinued after 24 seconds due to a single dropped retrograde beat. Measurements following ablation: Following ablation, Rapid atrial pacing was again performed with PR<RR and an AV WCL of 490 msec. AEST was performed which revealed no AH jumps, echo beats, or tachycardias induced. No arrhythmias were induced. Following ablation the AH interval was 215 msec with an HV interval of 46 msec. The procedure was therefore considered completed. All catheters were removed and the sheaths were aspirated and flushed. The sheaths were removed and hemostasis was assured. EBL<37ml. There were no early apparent complications. CONCLUSIONS: 1. Sinus rhythm upon  presentation. 2. The patient had dual AV nodal physiology with easily inducible classic AV nodal reentrant tachycardia, there were no other accessory pathways or arrhythmias induced 3. Successful radiofrequency modification of the slow AV nodal pathway 4. No inducible arrhythmias following ablation. 5. No early apparent complications.     Medications:     Scheduled Medications: . amiodarone  200 mg Oral Daily  . atorvastatin  40 mg Oral q1800  . Chlorhexidine Gluconate Cloth  6 each Topical Daily  . doxycycline  100 mg Oral Q12H  . metoprolol succinate  25 mg Oral Daily  . midodrine  2.5 mg Oral BID WC  . multivitamin with minerals   Oral  Once per day on Mon Wed Fri  . pantoprazole  40 mg Oral Daily  . polyethylene glycol  17 g Oral Daily  . sodium chloride flush  10-40 mL Intracatheter Q12H  . sodium chloride flush  10-40 mL Intracatheter Q12H  . sodium chloride flush  3 mL Intravenous Q12H  . sodium chloride flush  3 mL Intravenous Q12H  . tamsulosin  0.4 mg Oral Daily  . Warfarin - Pharmacist Dosing Inpatient   Does not apply q1600    Infusions: . sodium chloride 20 mL/hr at 09/03/20 0803  . sodium chloride    . milrinone 0.125 mcg/kg/min (09/04/20 0727)    PRN Medications: sodium chloride, acetaminophen, ondansetron (ZOFRAN) IV, senna-docusate, sodium chloride flush, sodium chloride flush, sodium chloride flush   Assessment/Plan   1. 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  Echo from this admission was reviewed, by my interpretation EF 20-25%, moderate RV enlargement with severely decreased RV systolic function, normally functioning bioprosthetic mitral valve with no significant MR and mean gradient 6, moderate TR, dilated IVC.  Patient had normal EF in 12/13 pre-mitral valve replacement.  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  His last cath was in 1/14 pre-surgery, no significant CAD. On [place] 12/10, evidence for severe biventricular failure with PCWP and RA pressure > 30 and PAPi 0.8.  Equalization of diastolic pressures concerning for restrictive physiology.  Cardiac output preserved (CI 2.28 Fick, 2.33 thermo).   It is possible that cardiomyopathy is at least in part tachycardia-mediated given frequent runs of SVT (though rate is relatively slow, 110s for the most part).  Also has history of frequent PVCs.  Cannot rule out coronary disease though no coronary angiography yet with elevated creatinine. He remains on milrinone 0.125 currently with good co-ox 80%. CVP 2-3.  - Hold diuretics.  Stop milrinone.   - Eventual coronary angiography to assess for CAD as cause of worsened EF if creatinine stabilizes (no ACS so not urgent).  - No ARNI/ARB/spironolactone/digoxin with elevated creatinine.  - No Bidil for now with soft BP, consider if BP stabilizes.  - Stop midodrine.   - Continue Toprol XL 25 mg daily.  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  On amiodarone gtt, he has been in and out of NSR, currently back in SVT. ?Component of tachy-mediated cardiomyopathy.  HR not markedly high when in SVT but he seems to have been predominantly in SVT recently.  - S/P ablation 09/03/20.  -  We need to keep in NSR if at all possible given concern for tachy-mediated CMP.  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 This may play a role in biventricular failure.  -  Watch carefully with milrinone.  - Now on amio 200 mg daily.    4. PAD: h/o right popliteal occlusion. Continue atorvastatin.  5. Atrial fibrillation: Paroxysmal.  Have not seen atrial fibrillation this admission. Has history of CVA.  - On warfarin, INR 3.1 today. Discussed with pharmacy. .  6. 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  7. AKI on CKD stage 3: Suspect cardiorenal syndrome in setting of RV failure/markedly elevated CVP. Creatinine  Trending down.   CO-OX stable on milrinone. Stop milrione and midodrine. Will discuss with Dr Aundra Dubin.    Consult cardiac rehab and PT. May need HH.   Length of Stay: [place], NP  09/04/2020, 7:29 AM  Advanced Heart Failure Team Pager [phone] (M-F; 7a - 4p)  Please contact [facility] for night-coverage after hours (4p -7a ) and weekends on [email or web address]  Patient seen with NP, agree with the above note .  He had AVNRT ablation yesterday, in NSR with 1st degree AVB.  Co-ox 80% with CVP 2-3.  No complaints.  General: NAD Neck: No JVD, no thyromegaly or thyroid nodule.  Lungs: Clear to auscultation bilaterally with normal respiratory  effort. CV: Nondisplaced PMI.  Heart regular S1/S2, no S3/S4, no murmur.  No peripheral edema.  Abdomen: Soft, nontender, no hepatosplenomegaly, no distention.  Skin: Intact without lesions or rashes.  Neurologic: Alert and oriented x 3.  Psych: Normal affect. Extremities: No clubbing or cyanosis.  HEENT: Normal.   Stop milrinone and midodrine today.  Hold off on diuretic with low CVP.  Creatinine lower at 1.92.  Will start Farxiga 10 mg daily today.   Now in NSR, continue amiodarone, warfarin.   Ideally would have coronary angiography but will let creatinine stabilize, can do at some point in future.   Mobilize.   Loralie Champagne 09/04/2020 11:58 AM

## 2020-09-04 NOTE — Progress Notes (Addendum)
CARDIAC REHAB PHASE I   PRE:  Rate/Rhythm: 30 JR with PVCs    BP: sitting 90/63    SaO2: 99 RA  MODE:  Ambulation: to door then recliner   POST:  Rate/Rhythm: 88 JR with PVCs    BP: sitting 86/69     SaO2: 98 RA  Pt weak, hasn't been OOB several days. Increased ectopy moving, short run NSVT. Mod assist to get to EOB and stand with gait belt, assist x2. Used rollator as he uses at home. C/o dizziness and weakness walking. To door then to recliner on other side of bed. Still c/o dizziness, BP slightly lower after sitting. Felt better after sitting. Gave HF materials and began discussing. Encouraged pt to read, got his glasses. Will benefit from PT (order in) as he lives alone and is weak.  [id number]  Darrick Meigs CES, ACSM 09/04/2020 10:59 AM

## 2020-09-04 NOTE — Progress Notes (Signed)
ANTICOAGULATION CONSULT NOTE - Follow Up Consult  Pharmacy Consult for Coumadin Indication: atrial fibrillation  Allergies  Allergen Reactions  . Tuna [Fish Allergy] Nausea And Vomiting    Patient Measurements: Height: 6' (182.9 cm) Weight: 76 kg (167 lb 8.8 oz) IBW/kg (Calculated) : 77.6  Vital Signs: Temp: 97.9 F (36.6 C) (12/16 1050) Temp Source: Oral (12/16 1050) BP: 103/68 (12/16 1050) Pulse Rate: 83 (12/16 1050)  Labs: Recent Labs    09/02/20 0218 09/03/20 0500 09/03/20 0651 09/03/20 1939 09/04/20 0453  HGB 11.6* 11.5*  --  12.1* 10.8*  HCT 36.7* 35.9*  --  36.9* 32.3*  PLT 103* 106*  --  109* 113*  LABPROT 26.2* 30.7*  --   --  30.7*  INR 2.5* 3.1*  --   --  3.1*  CREATININE 2.29*  --  2.17*  --  1.92*    Estimated Creatinine Clearance: 35.2 mL/min (A) (by C-G formula based on SCr of 1.92 mg/dL (H)).   Medical History: Past Medical History:  Diagnosis Date  . AICD (automatic cardioverter/defibrillator) present   . Atrial fibrillation ([place])   . 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 CHF (congestive heart failure) ([place])   . Diabetes mellitus without complication ([place])   . Endocarditis   . Hypertension   . Peripheral vascular disease ([place])   . PVC's (premature ventricular contractions)   . SVT (supraventricular tachycardia)  long RP     Assessment: 74 yo male presented on 08/25/2020 as a direct admission from EP clinic for significant SOB with minimal exertion, PND, and orthopnea. Patient is s/p bioprosthetic mitral valve repair in 2014. Patient is on warfarin prior to admission for Afib. INR 1.9 on admission.  INR 3 at top of goal.  S/p SVT ablation 12/15  Warfarin dose held last pm. Will aim for INR closer to 2.5 to avoid bleeding complications.  Patient was  on amiodarone infusion prior to abalation > po  which can potentiate warfarin effects. CBC stable, no bleeding noted.   Prior to admission warfarin regimen: warfarin 5mg  daily   Goal of Therapy:   INR 2-3 Monitor platelets by anticoagulation protocol: Yes   Plan:  Warfarin 2.5mg  x1 tonight Will likely need lower than PTA dose at DC with new amiodarone Monitor INR, CBC and S/S of bleeding daily    [name] Pharm.D. CPP, BCPS Clinical Pharmacist [phone] 09/04/2020 1:22 PM

## 2020-09-04 NOTE — Evaluation (Signed)
Physical Therapy Evaluation Patient Details Name: Ryan [name] MRN: [id number] DOB: 03-31-1946 Today's Date: 09/04/2020   History of Present Illness  Pt is a 74 y/o male  admitted secondary to SVT and is s/p ablation. PMH includes CHF, a fib, s/p ICD, HTN, and PVD.  Clinical Impression  Pt admitted secondary to problem above with deficits below. Pt requiring mod A to stand and min A to ambulate short distance within the room with RW. Pt reports he lives alone, but his son is here visiting. Feel he would benefit from SNF level therapies at this time, however, may progress well with mobility progression. Discussed that if pt does go home, will need 24/7 support. Will continue to follow acutely to maximize functional mobility independence and safety.     Follow Up Recommendations SNF;Home health PT;Supervision/Assistance - 24 hour    Equipment Recommendations  Rolling walker with 5" wheels;3in1 (PT)    Recommendations for Other Services       Precautions / Restrictions Precautions Precautions: Fall Restrictions Weight Bearing Restrictions: No      Mobility  Bed Mobility               General bed mobility comments: In chair upon entry    Transfers Overall transfer level: Needs assistance Equipment used: Rolling walker (2 wheeled) Transfers: Sit to/from Stand Sit to Stand: Mod assist         General transfer comment: Mod A for lift assist and steadying to stand from lower chair height. Cues for hand placement.  Ambulation/Gait Ambulation/Gait assistance: Min assist Gait Distance (Feet): 20 Feet Assistive device: Rolling walker (2 wheeled) Gait Pattern/deviations: Step-through pattern;Decreased stride length;Narrow base of support Gait velocity: Decreased   General Gait Details: Very narrow BOS. Shuffle type steps. Slow gait speed and mild unsteadiness noted requiring min A for steadying.  Stairs            Wheelchair Mobility    Modified Rankin  (Stroke Patients Only)       Balance Overall balance assessment: Needs assistance Sitting-balance support: No upper extremity supported;Feet supported Sitting balance-Leahy Scale: Fair     Standing balance support: Bilateral upper extremity supported;During functional activity Standing balance-Leahy Scale: Poor Standing balance comment: Reliant on BUE support                             Pertinent Vitals/Pain Pain Assessment: No/denies pain    Home Living Family/patient expects to be discharged to:: Private residence Living Arrangements: Alone Available Help at Discharge: Family;Available PRN/intermittently Type of Home: House Home Access: Stairs to enter Entrance Stairs-Rails: None Entrance Stairs-Number of Steps: 2 Home Layout: One level Home Equipment: Environmental consultant - 4 wheels Additional Comments: Reports son is in from [place], but will likely only be here throughout the weekend    Prior Function Level of Independence: Independent with assistive device(s)         Comments: Uses rollator for ambulation     Hand Dominance        Extremity/Trunk Assessment   Upper Extremity Assessment Upper Extremity Assessment: Defer to OT evaluation    Lower Extremity Assessment Lower Extremity Assessment: Generalized weakness    Cervical / Trunk Assessment Cervical / Trunk Assessment: Kyphotic  Communication   Communication: No difficulties  Cognition Arousal/Alertness: Awake/alert Behavior During Therapy: WFL for tasks assessed/performed Overall Cognitive Status: No family/caregiver present to determine baseline cognitive functioning  General Comments General comments (skin integrity, edema, etc.): Educated about need for assist at home. Pt reporting he would discuss with his family.    Exercises     Assessment/Plan    PT Assessment Patient needs continued PT services  PT Problem List Decreased  strength;Decreased balance;Decreased mobility;Decreased knowledge of use of DME       PT Treatment Interventions DME instruction;Stair training;Gait training;Therapeutic activities;Functional mobility training;Balance training;Therapeutic exercise;Patient/family education    PT Goals (Current goals can be found in the Care Plan section)  Acute Rehab PT Goals Patient Stated Goal: to get stronger PT Goal Formulation: With patient Time For Goal Achievement: 09/18/20 Potential to Achieve Goals: Good    Frequency Min 3X/week   Barriers to discharge Decreased caregiver support      Co-evaluation               AM-PAC PT "6 Clicks" Mobility  Outcome Measure Help needed turning from your back to your side while in a flat bed without using bedrails?: A Little Help needed moving from lying on your back to sitting on the side of a flat bed without using bedrails?: A Little Help needed moving to and from a bed to a chair (including a wheelchair)?: A Little Help needed standing up from a chair using your arms (e.g., wheelchair or bedside chair)?: A Lot Help needed to walk in hospital room?: A Little Help needed climbing 3-5 steps with a railing? : A Lot 6 Click Score: 16    End of Session Equipment Utilized During Treatment: Gait belt Activity Tolerance: Patient tolerated treatment well Patient left: in chair;with call bell/phone within reach;with chair alarm set Nurse Communication: Mobility status PT Visit Diagnosis: Unsteadiness on feet (R26.81);Muscle weakness (generalized) (M62.81)    Time: 6468-0321 PT Time Calculation (min) (ACUTE ONLY): 19 min   Charges:   PT Evaluation $PT Eval Moderate Complexity: 1 Mod          Reuel Derby, PT, DPT  Acute Rehabilitation Services  Pager: [phone] Office: [phone]   Rudean Hitt 09/04/2020, 3:20 PM

## 2020-09-05 LAB — CBC
HCT: 33.3 % — ABNORMAL LOW (ref 39.0–52.0)
Hemoglobin: 10.6 g/dL — ABNORMAL LOW (ref 13.0–17.0)
MCH: 22.6 pg — ABNORMAL LOW (ref 26.0–34.0)
MCHC: 31.8 g/dL (ref 30.0–36.0)
MCV: 70.9 fL — ABNORMAL LOW (ref 80.0–100.0)
Platelets: 126 10*3/uL — ABNORMAL LOW (ref 150–400)
RBC: 4.7 MIL/uL (ref 4.22–5.81)
RDW: 17.4 % — ABNORMAL HIGH (ref 11.5–15.5)
WBC: 7 10*3/uL (ref 4.0–10.5)
nRBC: 0 % (ref 0.0–0.2)

## 2020-09-05 LAB — PROTIME-INR
INR: 2.6 — ABNORMAL HIGH (ref 0.8–1.2)
Prothrombin Time: 27.2 seconds — ABNORMAL HIGH (ref 11.4–15.2)

## 2020-09-05 LAB — COOXEMETRY PANEL
Carboxyhemoglobin: 1.5 % (ref 0.5–1.5)
Methemoglobin: 0.9 % (ref 0.0–1.5)
O2 Saturation: 63.6 %
Total hemoglobin: 11 g/dL — ABNORMAL LOW (ref 12.0–16.0)

## 2020-09-05 LAB — BASIC METABOLIC PANEL
Anion gap: 11 (ref 5–15)
BUN: 39 mg/dL — ABNORMAL HIGH (ref 8–23)
CO2: 29 mmol/L (ref 22–32)
Calcium: 8.4 mg/dL — ABNORMAL LOW (ref 8.9–10.3)
Chloride: 97 mmol/L — ABNORMAL LOW (ref 98–111)
Creatinine, Ser: 1.81 mg/dL — ABNORMAL HIGH (ref 0.61–1.24)
GFR, Estimated: 38 mL/min — ABNORMAL LOW (ref >60)
Glucose, Bld: 104 mg/dL — ABNORMAL HIGH (ref 70–99)
Potassium: 3.7 mmol/L (ref 3.5–5.1)
Sodium: 137 mmol/L (ref 135–145)

## 2020-09-05 LAB — GLUCOSE, CAPILLARY: Glucose-Capillary: 121 mg/dL — ABNORMAL HIGH (ref 70–99)

## 2020-09-05 MED ORDER — EMPAGLIFLOZIN 10 MG PO TABS
10.0000 mg | ORAL_TABLET | Freq: Every day | ORAL | Status: DC
  Administered 2020-09-05 – 2020-09-06 (×2): 10 mg via ORAL
  Filled 2020-09-05 (×3): qty 1

## 2020-09-05 MED ORDER — WARFARIN SODIUM 5 MG PO TABS
5.0000 mg | ORAL_TABLET | Freq: Once | ORAL | Status: AC
  Administered 2020-09-05: 5 mg via ORAL
  Filled 2020-09-05: qty 1

## 2020-09-05 MED ORDER — POTASSIUM CHLORIDE CRYS ER 20 MEQ PO TBCR
20.0000 meq | EXTENDED_RELEASE_TABLET | Freq: Once | ORAL | Status: AC
  Administered 2020-09-05: 20 meq via ORAL
  Filled 2020-09-05: qty 1

## 2020-09-05 MED ORDER — SPIRONOLACTONE 12.5 MG HALF TABLET
12.5000 mg | ORAL_TABLET | Freq: Every day | ORAL | Status: DC
  Administered 2020-09-05 – 2020-09-06 (×2): 12.5 mg via ORAL
  Filled 2020-09-05 (×2): qty 1

## 2020-09-05 NOTE — Progress Notes (Signed)
  Mobility Specialist Criteria Algorithm Info.  Mobility Team: [facility] elevated:Self regulated Activity: Ambulated in hall; Transferred:  Chair to bed (LE exercises; in chair before ambulation, back to bed after) Range of motion: Active; All extremities Level of assistance: Minimal assist, patient does 75% or more (Min A sit/stand) Assistive device: Front wheel walker Minutes sitting in chair:  Minutes stood: 5 minutes Minutes ambulated: 5 minutes Distance ambulated (ft): 100 ft Mobility response: Tolerated well Bed Position: Semi-fowlers  Pt agreed to participate in mobility stating that he'll take any therapy he can get so he can go home. Stood with minimal HHA then ambulated 80 feet in hallway min guard with RW. Also completed self guided LE exercises.Tolerated ambulation well without incident or complaint and is now lying in bed with all needs met.   09/05/2020 2:19 PM

## 2020-09-05 NOTE — Progress Notes (Signed)
ANTICOAGULATION CONSULT NOTE - Follow Up Consult  Pharmacy Consult for Coumadin Indication: atrial fibrillation  Allergies  Allergen Reactions  . Tuna [Fish Allergy] Nausea And Vomiting    Patient Measurements: Height: 6' (182.9 cm) Weight: 72.9 kg (160 lb 12.8 oz) IBW/kg (Calculated) : 77.6  Vital Signs: Temp: 98.2 F (36.8 C) (12/17 0450) Temp Source: Oral (12/17 0450) BP: 94/60 (12/17 0450) Pulse Rate: 73 (12/17 0450)  Labs: Recent Labs    09/03/20 0500 09/03/20 0651 09/03/20 1939 09/04/20 0453 09/05/20 0450  HGB 11.5*  --  12.1* 10.8* 10.6*  HCT 35.9*  --  36.9* 32.3* 33.3*  PLT 106*  --  109* 113* 126*  LABPROT 30.7*  --   --  30.7* 27.2*  INR 3.1*  --   --  3.1* 2.6*  CREATININE  --  2.17*  --  1.92* 1.81*    Estimated Creatinine Clearance: 35.8 mL/min (A) (by C-G formula based on SCr of 1.81 mg/dL (H)).   Medical History: Past Medical History:  Diagnosis Date  . AICD (automatic cardioverter/defibrillator) present   . Atrial fibrillation ([place])   . Cardiomyopathy, dilated ([place])   . CHF (congestive heart failure) ([place])   . Diabetes mellitus without complication ([place])   . Endocarditis   . Hypertension   . Peripheral vascular disease ([place])   . PVC's (premature ventricular contractions)   . SVT (supraventricular tachycardia)  long RP     Assessment: 74 yo male presented on 08/25/2020 as a direct admission from EP clinic for significant SOB with minimal exertion, PND, and orthopnea. Patient is s/p bioprosthetic mitral valve repair in 2014 and has AFib. Pt on warfarin PTA, INR 1.9 on admit.  INR this morning therapeutic at 2.6, CBC stable. New amiodarone now converted from IV to PO. Pt will likely need reduced discharge regimen (ex. 2.5mg  MWF, 5mg  all other days).  Prior to admission warfarin regimen: warfarin 5mg  daily   Goal of Therapy:  INR 2-3 Monitor platelets by anticoagulation protocol: Yes   Plan:  Warfarin 5mg  x1 tonight Daily INR  Arrie Senate, PharmD, BCPS, [facility] Clinical Pharmacist [phone] Please check AMION for all [place] numbers 09/05/2020

## 2020-09-05 NOTE — Progress Notes (Signed)
Paged provider Rosita Fire regarding pt's soft bp running in the 90s/60s. Per provider, it's ok to give pt metoprolol as long as pt's above 90 systolic. Will continue to monitor pt.

## 2020-09-05 NOTE — Progress Notes (Signed)
ReDS Clip Diuretic Study Pt study # [id number]  Your patient is in the Blinded arm of the ReDS Clip Diuretic study.  Your patient has had a ReDS reading and the reading has been transmitted to the cloud.   Thank You   The research team  Kerby Nora, PharmD, BCPS Heart Failure Stewardship Pharmacist Phone [phone]  Please check [email or web address] for unit-specific pharmacist phone numbers

## 2020-09-05 NOTE — Plan of Care (Signed)
  Problem: Education: Goal: Knowledge of General Education information will improve Description: Including pain rating scale, medication(s)/side effects and non-pharmacologic comfort measures Outcome: Progressing   Problem: Clinical Measurements: Goal: Will remain free from infection Outcome: Progressing   

## 2020-09-05 NOTE — Progress Notes (Signed)
Physical Therapy Treatment Patient Details Name: Ryan [name] MRN: [id number] DOB: [birth year] Today's Date: 09/05/2020    History of Present Illness Pt is a 74 y/o male  admitted secondary to SVT and is s/p ablation. PMH includes CHF, a fib, s/p ICD, HTN, and PVD.    PT Comments    Pt making good progress. Still needs some assistance for mobility and lives alone so will keep recommendation for SNF. Will continue to follow and work toward more independence and ability to return home.    Follow Up Recommendations  SNF (If progresses quickly may be able to return home)     Equipment Recommendations  None recommended by PT    Recommendations for Other Services       Precautions / Restrictions Precautions Precautions: Fall Restrictions Weight Bearing Restrictions: No    Mobility  Bed Mobility Overal bed mobility: Needs Assistance Bed Mobility: Supine to Sit;Sit to Supine     Supine to sit: Supervision;HOB elevated Sit to supine: Supervision;HOB elevated   General bed mobility comments: Incr time and effort but no physical assist  Transfers Overall transfer level: Needs assistance Equipment used: Rolling walker (2 wheeled) Transfers: Sit to/from Stand Sit to Stand: Min assist;From elevated surface         General transfer comment: Assist to bring hips up.  Ambulation/Gait Ambulation/Gait assistance: Min guard Gait Distance (Feet): 160 Feet Assistive device: Rolling walker (2 wheeled) Gait Pattern/deviations: Step-through pattern;Decreased stride length;Narrow base of support;Trunk flexed Gait velocity: Decreased Gait velocity interpretation: <1.8 ft/sec, indicate of risk for recurrent falls General Gait Details: Assist for safety and lines   Stairs             Wheelchair Mobility    Modified Rankin (Stroke Patients Only)       Balance Overall balance assessment: Needs assistance Sitting-balance support: No upper extremity supported;Feet  supported Sitting balance-Leahy Scale: Fair     Standing balance support: Bilateral upper extremity supported;During functional activity Standing balance-Leahy Scale: Poor Standing balance comment: UE support and supervision for static standing                            Cognition Arousal/Alertness: Awake/alert Behavior During Therapy: WFL for tasks assessed/performed Overall Cognitive Status: No family/caregiver present to determine baseline cognitive functioning                                        Exercises      General Comments        Pertinent Vitals/Pain Pain Assessment: No/denies pain    Home Living Family/patient expects to be discharged to:: Private residence Living Arrangements: Alone Available Help at Discharge: Family;Available PRN/intermittently Type of Home: House Home Access: Stairs to enter Entrance Stairs-Rails: None Home Layout: One level Home Equipment: Environmental consultant - 4 wheels      Prior Function Level of Independence: Independent with assistive device(s)    ADL's / Homemaking Assistance Needed: pt reports that he was Ind with ADLs, cooking, home mgt Comments: Uses rollator for ambulation   PT Goals (current goals can now be found in the care plan section) Acute Rehab PT Goals Patient Stated Goal: to get stronger Progress towards PT goals: Progressing toward goals    Frequency    Min 3X/week      PT Plan Current plan remains appropriate    Co-evaluation  AM-PAC PT "6 Clicks" Mobility   Outcome Measure  Help needed turning from your back to your side while in a flat bed without using bedrails?: A Little Help needed moving from lying on your back to sitting on the side of a flat bed without using bedrails?: A Little Help needed moving to and from a bed to a chair (including a wheelchair)?: A Little Help needed standing up from a chair using your arms (e.g., wheelchair or bedside chair)?: A  Little Help needed to walk in hospital room?: A Little Help needed climbing 3-5 steps with a railing? : A Lot 6 Click Score: 17    End of Session Equipment Utilized During Treatment: Gait belt Activity Tolerance: Patient tolerated treatment well Patient left: with call bell/phone within reach;in bed;with bed alarm set   PT Visit Diagnosis: Unsteadiness on feet (R26.81);Muscle weakness (generalized) (M62.81)     Time: 9093-1121 PT Time Calculation (min) (ACUTE ONLY): 16 min  Charges:  $Gait Training: 8-22 mins                     [place] Pager [phone] Office [place] 09/05/2020, 5:10 PM

## 2020-09-05 NOTE — Evaluation (Signed)
Occupational Therapy Evaluation Patient Details Name: Ryan [name] MRN: [id number] DOB: 12/25/1945 Today's Date: 09/05/2020    History of Present Illness Pt is a 74 y/o male  admitted secondary to SVT and is s/p ablation. PMH includes CHF, a fib, s/p ICD, HTN, and PVD.   Clinical Impression   Pt presents with decline in function and safety with ADLs and ADL mobility with impaired strength, balance and endurance. Pt with functional deficits listed below and would benefit from acute OT services to maximize level of function and safety    Follow Up Recommendations  SNF;Supervision/Assistance - 24 hour (SNF vs HH depending on progress)    Equipment Recommendations   (TBD at next venue of care)    Recommendations for Other Services       Precautions / Restrictions Precautions Precautions: Fall Restrictions Weight Bearing Restrictions: No      Mobility Bed Mobility               General bed mobility comments: In chair upon upon arrival    Transfers Overall transfer level: Needs assistance Equipment used: Rolling walker (2 wheeled) Transfers: Sit to/from Stand Sit to Stand: Min assist         General transfer comment: Min A for lift assist and steadying to stand from lower chair height. Cues for hand placement.    Balance Overall balance assessment: Needs assistance Sitting-balance support: No upper extremity supported;Feet supported Sitting balance-Leahy Scale: Fair     Standing balance support: Bilateral upper extremity supported;During functional activity Standing balance-Leahy Scale: Poor                             ADL either performed or assessed with clinical judgement   ADL Overall ADL's : Needs assistance/impaired Eating/Feeding: Independent   Grooming: Wash/dry hands;Wash/dry face;Min guard;Standing   Upper Body Bathing: Supervision/ safety;Set up;Sitting   Lower Body Bathing: Moderate assistance   Upper Body Dressing : Set  up;Supervision/safety;Sitting   Lower Body Dressing: Moderate assistance   Toilet Transfer: RW;Stand-pivot;BSC;Cueing for safety;Cueing for sequencing;Minimal assistance   Toileting- Clothing Manipulation and Hygiene: Moderate assistance;Sit to/from stand       Functional mobility during ADLs: Cueing for sequencing;Cueing for safety;Rolling walker;Minimal assistance       Vision Patient Visual Report: No change from baseline       Perception     Praxis      Pertinent Vitals/Pain Pain Assessment: No/denies pain     Hand Dominance Right   Extremity/Trunk Assessment Upper Extremity Assessment Upper Extremity Assessment: Generalized weakness   Lower Extremity Assessment Lower Extremity Assessment: Defer to PT evaluation   Cervical / Trunk Assessment Cervical / Trunk Assessment: Kyphotic   Communication Communication Communication: No difficulties   Cognition Arousal/Alertness: Awake/alert Behavior During Therapy: WFL for tasks assessed/performed Overall Cognitive Status: No family/caregiver present to determine baseline cognitive functioning                                     General Comments       Exercises     Shoulder Instructions      Home Living Family/patient expects to be discharged to:: Private residence Living Arrangements: Alone Available Help at Discharge: Family;Available PRN/intermittently Type of Home: House Home Access: Stairs to enter [employer] of Steps: 2 Entrance Stairs-Rails: None Home Layout: One level     Bathroom Shower/Tub:  Tub/shower unit   Bathroom Toilet: Standard     Home Equipment: Environmental consultant - 4 wheels          Prior Functioning/Environment Level of Independence: Independent with assistive device(s)    ADL's / Homemaking Assistance Needed: pt reports that he was Ind with ADLs, cooking, home mgt   Comments: Uses rollator for ambulation        OT Problem List: Decreased  strength;Impaired balance (sitting and/or standing);Decreased activity tolerance;Decreased knowledge of use of DME or AE      OT Treatment/Interventions: Self-care/ADL training;DME and/or AE instruction;Therapeutic exercise;Patient/family education;Therapeutic activities;Balance training    OT Goals(Current goals can be found in the care plan section) Acute Rehab OT Goals Patient Stated Goal: to get stronger OT Goal Formulation: With patient Time For Goal Achievement: 09/19/20 Potential to Achieve Goals: Good ADL Goals Pt Will Perform Grooming: with set-up;with supervision;standing Pt Will Perform Upper Body Bathing: with set-up;sitting Pt Will Perform Lower Body Bathing: with min assist;sitting/lateral leans;sit to/from stand Pt Will Perform Upper Body Dressing: with set-up;sitting Pt Will Perform Lower Body Dressing: with min assist;sitting/lateral leans;sit to/from stand Pt Will Transfer to Toilet: with min guard assist;with supervision;ambulating Pt Will Perform Toileting - Clothing Manipulation and hygiene: with min assist;sit to/from stand  OT Frequency: Min 2X/week   Barriers to D/C: Decreased caregiver support          Co-evaluation              AM-PAC OT "6 Clicks" Daily Activity     Outcome Measure Help from another person eating meals?: None Help from another person taking care of personal grooming?: A Little Help from another person toileting, which includes using toliet, bedpan, or urinal?: A Lot Help from another person bathing (including washing, rinsing, drying)?: A Lot Help from another person to put on and taking off regular upper body clothing?: A Little Help from another person to put on and taking off regular lower body clothing?: A Lot 6 Click Score: 16   End of Session Equipment Utilized During Treatment: Gait belt;Rolling walker;Other (comment) (BSC)  Activity Tolerance: Patient tolerated treatment well Patient left: in bed;with call bell/phone  within reach;with chair alarm set  OT Visit Diagnosis: Unsteadiness on feet (R26.81);Other abnormalities of gait and mobility (R26.89);Muscle weakness (generalized) (M62.81)                Time: 8675-4492 OT Time Calculation (min): 24 min Charges:  OT General Charges $OT Visit: 1 Visit OT Evaluation $OT Eval Moderate Complexity: 1 Mod OT Treatments $Self Care/Home Management : 8-22 mins    Emmit Alexanders [facility] 09/05/2020, 3:08 PM

## 2020-09-05 NOTE — Progress Notes (Addendum)
Patient ID: Ryan [name], male   DOB: Jul 03, 1946, 74 y.o.   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 Maintaining NSR.   Milrinone discontinued yesterday. Co-ox ok at 64%.   Scr improved w/ diuretic hold, down from 2.17>>1.92>>1.81.   CVP 4-5.   SBPs mid 90s-110s off midodrine.   OOB sitting up in chair eating breakfast. Felt mildly SOB ambulating this am but no resting dyspnea.     Objective:   Weight Range: 72.9 kg Body mass index is 21.81 kg/m.   Vital Signs:   Temp:  [97.9 F (36.6 C)-98.8 F (37.1 C)] 98.2 F (36.8 C) (12/17 0450) Pulse Rate:  [73-84] 73 (12/17 0450) Resp:  [14-18] 16 (12/17 0450) BP: (87-110)/(57-76) 94/60 (12/17 0450) SpO2:  [95 %-96 %] 96 % (12/17 0450) Weight:  [72.9 kg] 72.9 kg (12/17 0620) Last BM Date: 09/01/20  Weight change: Filed Weights   09/02/20 0500 09/04/20 0525 09/05/20 0620  Weight: 76.1 kg 76 kg 72.9 kg    Intake/Output:   Intake/Output Summary (Last 24 hours) at 09/05/2020 1607 Last data filed at 09/05/2020 0746 Gross per 24 hour  Intake 641.16 ml  Output 570 ml  Net 71.16 ml      Physical Exam   CVP 4-5  General:  Well appearing elderly male sitting up in chair. No respiratory difficulty HEENT: normal Neck: supple. no JVD. Carotids 2+ bilat; no bruits. No lymphadenopathy or thyromegaly appreciated. Cor: PMI nondisplaced. Regular rate & rhythm. No rubs, gallops or murmurs. Lungs: clear Abdomen: soft, nontender, nondistended. No hepatosplenomegaly. No bruits or masses. Good bowel sounds. Extremities: no cyanosis, clubbing, rash, edema Neuro: alert & oriented x 3, cranial nerves grossly intact. moves all 4 extremities w/o difficulty. Affect pleasant.   Telemetry   NSR 70s    Labs    CBC Recent Labs    09/04/20 0453 09/05/20 0450  WBC 7.9 7.0  HGB 10.8* 10.6*  HCT 32.3* 33.3*  MCV 70.1* 70.9*   PLT 113* 371*   Basic Metabolic Panel Recent Labs    09/04/20 0453 09/05/20 0450  NA 135 137  K 3.6 3.7  CL 96* 97*  CO2 29 29  GLUCOSE 109* 104*  BUN 40* 39*  CREATININE 1.92* 1.81*  CALCIUM 8.4* 8.4*   Liver Function Tests No results for input(s): AST, ALT, ALKPHOS, BILITOT, PROT, ALBUMIN in the last 72 hours. No results for input(s): LIPASE, AMYLASE in the last 72 hours. Cardiac Enzymes No results for input(s): CKTOTAL, CKMB, CKMBINDEX, TROPONINI in the last 72 hours.  BNP: BNP (last 3 results) Recent Labs    08/26/20 0622  BNP 2,235.1*    ProBNP (last 3 results) Recent Labs    08/25/20 1121  PROBNP 21,395*     D-Dimer No results for input(s): DDIMER in the last 72 hours. Hemoglobin A1C No results for input(s): HGBA1C in the last 72 hours. Fasting Lipid Panel No results for input(s): CHOL, HDL, LDLCALC, TRIG, CHOLHDL, LDLDIRECT in the last 72 hours. Thyroid Function Tests No results for input(s): TSH, T4TOTAL, T3FREE, THYROIDAB in the last 72 hours.  Invalid input(s): FREET3  Other results:   Imaging    No results found.   Medications:     Scheduled Medications: . amiodarone  200 mg Oral Daily  . atorvastatin  40 mg Oral q1800  . Chlorhexidine Gluconate Cloth  6 each Topical  Daily  . dapagliflozin propanediol  10 mg Oral Daily  . doxycycline  100 mg Oral Q12H  . metoprolol succinate  25 mg Oral Daily  . multivitamin with minerals   Oral Once per day on Mon Wed Fri  . pantoprazole  40 mg Oral Daily  . polyethylene glycol  17 g Oral Daily  . sodium chloride flush  10-40 mL Intracatheter Q12H  . sodium chloride flush  3 mL Intravenous Q12H  . tamsulosin  0.4 mg Oral Daily  . Warfarin - Pharmacist Dosing Inpatient   Does not apply q1600    Infusions: . sodium chloride 20 mL/hr at 09/03/20 0803  . sodium chloride      PRN Medications: sodium chloride, acetaminophen, ondansetron (ZOFRAN) IV, senna-docusate, sodium chloride flush,  sodium chloride flush   Assessment/Plan   1. 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  Echo from this admission was reviewed, by my interpretation EF 20-25%, moderate RV enlargement with severely decreased RV systolic function, normally functioning bioprosthetic mitral valve with no significant MR and mean gradient 6, moderate TR, dilated IVC.  Patient had normal EF in 12/13 pre-mitral valve replacement.  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  His last cath was in 1/14 pre-surgery, no significant CAD. On [place] 12/10, evidence for severe biventricular failure with PCWP and RA pressure > 30 and PAPi 0.8.  Equalization of diastolic pressures concerning for restrictive physiology.  Cardiac output preserved (CI 2.28 Fick, 2.33 thermo).   It is possible that cardiomyopathy is at least in part tachycardia-mediated given frequent runs of SVT (though rate is relatively slow, 110s for the most part).  Also has history of frequent PVCs.  Cannot rule out coronary disease though no coronary angiography yet with elevated creatinine. Required milrinone to help w/ diuresis. Now off milrinone (discontinued 12/16). Co-ox stable 64%. Off loop diuretics w/ low CVP (4-5). SCr improving 2.17>>1.92>>1.81 today. - Continue to hold diuretics - No ARNI/ARB/spironolactone/digoxin with elevated creatinine.  - No Bidil for now with soft BP, consider if BP stabilizes.  - Continue Toprol XL 25 mg daily.  - Change from [place] to Jardiance 10 mg due to cost  - Eventual coronary angiography to assess for CAD as cause of worsened EF if creatinine stabilizes (no ACS so not urgent).  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  On amiodarone gtt, he has been in and out of NSR, currently back in SVT. ?Component of tachy-mediated cardiomyopathy.  HR not markedly high when in SVT but he seems to have been predominantly in SVT  recently.  - S/P ablation 09/03/20. Maintaining NSR -  We need to keep in NSR if at all possible given concern for tachy-mediated CMP.  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 This may play a role in biventricular failure.  - Continue PO amio 200 mg daily.    4. PAD: h/o right popliteal occlusion. Continue atorvastatin.  5. Atrial fibrillation: Paroxysmal.  Have not seen atrial fibrillation this admission. Has history of CVA.  - On warfarin, INR 2.6 today. Discussed dosing with pharmacy.  6. 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  7. AKI on CKD stage 3: Suspect cardiorenal syndrome in setting of RV failure/markedly elevated CVP. Improved w/ milrinone and diuresis  - SCr trending down,  2.17>>1.92>>1.81.  8. Deconditioning - Continue to mobilize w/  PT. May need SNF. PT to visit again today. If progressing well, may be able to go home w/ home health PT  - SW following  Length of Stay: [address] [place]is Gene  09/05/2020, 8:12 AM  Advanced Heart Failure Team Pager [phone] (M-F; 7a - 4p)  Please contact [facility] for night-coverage after hours (4p -7a ) and weekends on [email or web address]  Patient seen with PA, agree with the above note.   Stable today, remains in NSR with 1st degree AVB. Low CVP, stable co-ox off milrinone.   He is not volume overloaded on exam.   Continue warfarin and amiodarone.    Ready for home probably tomorrow.  Will need Lasix at discharge, likely 40 mg daily.   Eventually would like to define his coronaries, but can set this up in the future after we see where his renal function settles.   Loralie Champagne 09/05/2020

## 2020-09-05 NOTE — TOC Progression Note (Signed)
Transition of Care [facility]) - Progression Note    Patient Details  Name: Ryan [name] MRN: [id number] Date of Birth: April 17, 1946  Transition of Care [facility]) CM/SW [place], [place] Phone Number: 09/05/2020, 12:31 PM  Clinical Narrative:    Pt had no bed offers. CSW made some phone calls, and are waiting for any new acceptances. SW will continue to follow for DC planning.   Expected Discharge Plan: [place] Barriers to Discharge: Continued Medical Work up  Expected Discharge Plan and Services Expected Discharge Plan: [place] arrangements for the past 2 months: Single Family Home                                       Social Determinants of Health (SDOH) Interventions    Readmission Risk Interventions No flowsheet data found.

## 2020-09-06 DIAGNOSIS — I5043 Acute on chronic combined systolic (congestive) and diastolic (congestive) heart failure: Secondary | ICD-10-CM

## 2020-09-06 DIAGNOSIS — I5021 Acute systolic (congestive) heart failure: Secondary | ICD-10-CM

## 2020-09-06 LAB — CBC
HCT: 31.4 % — ABNORMAL LOW (ref 39.0–52.0)
Hemoglobin: 10.5 g/dL — ABNORMAL LOW (ref 13.0–17.0)
MCH: 23.6 pg — ABNORMAL LOW (ref 26.0–34.0)
MCHC: 33.4 g/dL (ref 30.0–36.0)
MCV: 70.7 fL — ABNORMAL LOW (ref 80.0–100.0)
Platelets: 135 10*3/uL — ABNORMAL LOW (ref 150–400)
RBC: 4.44 MIL/uL (ref 4.22–5.81)
RDW: 17.6 % — ABNORMAL HIGH (ref 11.5–15.5)
WBC: 6 10*3/uL (ref 4.0–10.5)
nRBC: 0 % (ref 0.0–0.2)

## 2020-09-06 LAB — COOXEMETRY PANEL
Carboxyhemoglobin: 1.4 % (ref 0.5–1.5)
Methemoglobin: 0.9 % (ref 0.0–1.5)
O2 Saturation: 67.3 %
Total hemoglobin: 10.8 g/dL — ABNORMAL LOW (ref 12.0–16.0)

## 2020-09-06 LAB — BASIC METABOLIC PANEL
Anion gap: 9 (ref 5–15)
BUN: 38 mg/dL — ABNORMAL HIGH (ref 8–23)
CO2: 28 mmol/L (ref 22–32)
Calcium: 8.4 mg/dL — ABNORMAL LOW (ref 8.9–10.3)
Chloride: 100 mmol/L (ref 98–111)
Creatinine, Ser: 1.74 mg/dL — ABNORMAL HIGH (ref 0.61–1.24)
GFR, Estimated: 40 mL/min — ABNORMAL LOW (ref >60)
Glucose, Bld: 109 mg/dL — ABNORMAL HIGH (ref 70–99)
Potassium: 3.9 mmol/L (ref 3.5–5.1)
Sodium: 137 mmol/L (ref 135–145)

## 2020-09-06 LAB — PROTIME-INR
INR: 2.6 — ABNORMAL HIGH (ref 0.8–1.2)
Prothrombin Time: 26.8 seconds — ABNORMAL HIGH (ref 11.4–15.2)

## 2020-09-06 MED ORDER — SPIRONOLACTONE 25 MG PO TABS: 12.5000 mg | ORAL_TABLET | Freq: Every day | ORAL | 6 refills | Status: DC

## 2020-09-06 MED ORDER — CHLORHEXIDINE GLUCONATE CLOTH 2 % EX PADS
6.0000 | MEDICATED_PAD | Freq: Every day | CUTANEOUS | Status: DC
  Administered 2020-09-06: 6 via TOPICAL

## 2020-09-06 MED ORDER — WARFARIN SODIUM 2.5 MG PO TABS: ORAL_TABLET | ORAL | 1 refills | Status: DC

## 2020-09-06 MED ORDER — FUROSEMIDE 40 MG PO TABS
40.0000 mg | ORAL_TABLET | Freq: Every day | ORAL | Status: DC
  Filled 2020-09-06: qty 1

## 2020-09-06 MED ORDER — EMPAGLIFLOZIN 10 MG PO TABS: 10.0000 mg | ORAL_TABLET | Freq: Every day | ORAL | 6 refills | Status: DC

## 2020-09-06 MED ORDER — WARFARIN SODIUM 5 MG PO TABS: 5.0000 mg | ORAL_TABLET | Freq: Once | ORAL | Status: DC

## 2020-09-06 MED ORDER — FUROSEMIDE 40 MG PO TABS: 40.0000 mg | ORAL_TABLET | Freq: Every day | ORAL | 6 refills | Status: DC

## 2020-09-06 MED ORDER — AMIODARONE HCL 200 MG PO TABS: 200.0000 mg | ORAL_TABLET | Freq: Every day | ORAL | 6 refills | Status: DC

## 2020-09-06 MED ORDER — DOXYCYCLINE HYCLATE 100 MG PO TABS: 100.0000 mg | ORAL_TABLET | Freq: Two times a day (BID) | ORAL | 0 refills | Status: DC

## 2020-09-06 MED ORDER — METOPROLOL SUCCINATE ER 25 MG PO TB24: 25.0000 mg | ORAL_TABLET | Freq: Every day | ORAL | 6 refills | Status: DC

## 2020-09-06 MED ORDER — POTASSIUM CHLORIDE ER 10 MEQ PO TBCR
20.0000 meq | EXTENDED_RELEASE_TABLET | Freq: Every day | ORAL | Status: DC
  Filled 2020-09-06: qty 2

## 2020-09-06 MED ORDER — POTASSIUM CHLORIDE CRYS ER 20 MEQ PO TBCR
20.0000 meq | EXTENDED_RELEASE_TABLET | Freq: Every day | ORAL | 3 refills | Status: DC
Start: 2020-09-06 — End: 2020-09-23

## 2020-09-06 NOTE — Progress Notes (Addendum)
Patient ID: Ryan [name], male   DOB: [birth year], 74 y.o.   MRN: [id number]     Advanced Heart Failure Rounding Note  PCP-Cardiologist: [name], MD   Subjective:    S/p AVNRT ablation  12/15. Maintaining NSR.   Milrinone discontinued 12/16. Co-ox ok at 67%.   Scr improved w/ diuretic hold, down from 2.17>>1.92>>1.81 > 1.7.   SBPs mid 90s-110s off midodrine.   Feels good. Wants to go home. No CP, SOB, orthopnea or PND.     Objective:   Weight Range: 74.1 kg Body mass index is 22.16 kg/m.   Vital Signs:   Temp:  [97.9 F (36.6 C)-98.7 F (37.1 C)] 98.4 F (36.9 C) (12/18 0754) Pulse Rate:  [68-89] 68 (12/18 0754) Resp:  [15-18] 18 (12/18 0754) BP: (92-103)/(56-72) 103/65 (12/18 0754) SpO2:  [95 %-99 %] 97 % (12/18 0754) Weight:  [74.1 kg] 74.1 kg (12/18 0540) Last BM Date: 09/03/20  Weight change: Filed Weights   09/04/20 0525 09/05/20 0620 09/06/20 0540  Weight: 76 kg 72.9 kg 74.1 kg    Intake/Output:   Intake/Output Summary (Last 24 hours) at 09/06/2020 0924 Last data filed at 09/06/2020 0520 Gross per 24 hour  Intake 237 ml  Output 550 ml  Net -313 ml      Physical Exam   General:  Well appearing. No resp difficulty HEENT: normal Neck: supple. no JVD. Carotids 2+ bilat; no bruits. No lymphadenopathy or thryomegaly appreciated. Cor: PMI nondisplaced. Regular rate & rhythm. No rubs, gallops or murmurs. Lungs: clear Abdomen: soft, nontender, nondistended. No hepatosplenomegaly. No bruits or masses. Good bowel sounds. Extremities: no cyanosis, clubbing, rash, edema Neuro: alert & orientedx3, cranial nerves grossly intact. moves all 4 extremities w/o difficulty. Affect pleasant  Telemetry   Wandering atrial pacemaker 60-70s Personally reviewed    Labs    CBC Recent Labs    09/05/20 0450 09/06/20 0521  WBC 7.0 6.0  HGB 10.6* 10.5*  HCT 33.3* 31.4*  MCV 70.9* 70.7*  PLT 126* 767*   Basic Metabolic Panel Recent Labs     09/05/20 0450 09/06/20 0521  NA 137 137  K 3.7 3.9  CL 97* 100  CO2 29 28  GLUCOSE 104* 109*  BUN 39* 38*  CREATININE 1.81* 1.74*  CALCIUM 8.4* 8.4*   Liver Function Tests No results for input(s): AST, ALT, ALKPHOS, BILITOT, PROT, ALBUMIN in the last 72 hours. No results for input(s): LIPASE, AMYLASE in the last 72 hours. Cardiac Enzymes No results for input(s): CKTOTAL, CKMB, CKMBINDEX, TROPONINI in the last 72 hours.  BNP: BNP (last 3 results) Recent Labs    08/26/20 0622  BNP 2,235.1*    ProBNP (last 3 results) Recent Labs    08/25/20 1121  PROBNP 21,395*     D-Dimer No results for input(s): DDIMER in the last 72 hours. Hemoglobin A1C No results for input(s): HGBA1C in the last 72 hours. Fasting Lipid Panel No results for input(s): CHOL, HDL, LDLCALC, TRIG, CHOLHDL, LDLDIRECT in the last 72 hours. Thyroid Function Tests No results for input(s): TSH, T4TOTAL, T3FREE, THYROIDAB in the last 72 hours.  Invalid input(s): FREET3  Other results:   Imaging    No results found.   Medications:     Scheduled Medications: . amiodarone  200 mg Oral Daily  . atorvastatin  40 mg Oral q1800  . Chlorhexidine Gluconate Cloth  6 each Topical Daily  . doxycycline  100 mg Oral Q12H  . empagliflozin  10 mg  Oral Daily  . metoprolol succinate  25 mg Oral Daily  . multivitamin with minerals   Oral Once per day on Mon Wed Fri  . pantoprazole  40 mg Oral Daily  . polyethylene glycol  17 g Oral Daily  . sodium chloride flush  10-40 mL Intracatheter Q12H  . spironolactone  12.5 mg Oral Daily  . tamsulosin  0.4 mg Oral Daily  . warfarin  5 mg Oral ONCE-1600  . Warfarin - Pharmacist Dosing Inpatient   Does not apply q1600    Infusions: . sodium chloride 20 mL/hr at 09/03/20 0803    PRN Medications: acetaminophen, ondansetron (ZOFRAN) IV, senna-docusate, sodium chloride flush   Assessment/Plan   1. 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 Has MDT  ICD.  Echo from this admission was reviewed, by my interpretation EF 20-25%, moderate RV enlargement with severely decreased RV systolic function, normally functioning bioprosthetic mitral valve with no significant MR and mean gradient 6, moderate TR, dilated IVC.  Patient had normal EF in 12/13 pre-mitral valve replacement.  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  His last cath was in 1/14 pre-surgery, no significant CAD. On [place] 12/10, evidence for severe biventricular failure with PCWP and RA pressure > 30 and PAPi 0.8.  Equalization of diastolic pressures concerning for restrictive physiology.  Cardiac output preserved (CI 2.28 Fick, 2.33 thermo).   It is possible that cardiomyopathy is at least in part tachycardia-mediated given frequent runs of SVT (though rate is relatively slow, 110s for the most part).  Also has history of frequent PVCs.  Cannot rule out coronary disease though no coronary angiography yet with elevated creatinine. Required milrinone to help w/ diuresis. Now off milrinone (discontinued 12/16). Co-ox stable 67%. Off loop diuretics w/ low CVP (4-5). SCr improving 2.17>>1.92>>1.81 > 1.74 today. - Off diuretics for now. Will need lasix 40 at d/c.  - No ARNI/ARB/spironolactone/digoxin with elevated creatinine.  - No Bidil for now with soft BP, consider if BP stabilizes.  - Continue Toprol XL 25 mg daily.  - Changed from [place] to Jardiance 10 mg due to cost  - Eventual coronary angiography to assess for CAD as cause of worsened EF if creatinine stabilizes (no ACS so not urgent).  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  On amiodarone gtt, he has been in and out of NSR, currently back in SVT. ?Component of tachy-mediated cardiomyopathy.  HR not markedly high when in SVT but he seems to have been predominantly in SVT recently.  - S/P ablation 09/03/20. Maintaining NSR -  We need to keep in NSR if at  all possible given concern for tachy-mediated CMP.  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 This may play a role in biventricular failure.  - Continue PO amio 200 mg daily.    - No change 4. PAD: h/o right popliteal occlusion. Continue atorvastatin.  5. Atrial fibrillation: Paroxysmal.  Have not seen atrial fibrillation this admission. Has history of CVA.  - On warfarin, INR 2.6 today. 6. 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  7. AKI on CKD stage 3: Suspect cardiorenal syndrome in setting of RV failure/markedly elevated CVP. Improved w/ milrinone and diuresis  - SCr trending down,  2.17>>1.92>>1.81 > 1.74  8. Deconditioning - Continue to mobilize w/ PT. PT has recommended SNF with caveat that If progressing well, may  be able to go home w/ home health PT  - SW following  I spoke with him and his daughter this am. PT recommending SNF but he is refusing. Both he and his daughter feel he will be ok at home and that they have enough support for him. Will arrange HHPT/RN.   Can go today and current meds. Will f/u in HF Clinic.   Length of Stay: [place], [place]  09/06/2020, 9:24 AM  Advanced Heart Failure Team Pager [phone] (M-F; 7a - 4p)  Please contact [facility] for night-coverage after hours (4p -7a ) and weekends on [email or web address]

## 2020-09-06 NOTE — Progress Notes (Signed)
Discharge instructions given to patient and all questions answered.  Patient discharged via wheelchair with all belongings.PICC line removed per order and line is intact. Vaseline gauze and gauze dressing applied and pressure held. Site clean, dry, and intact. Patient and RN aware that patient is on bedrest for 30 minutes. Patient aware to leave dressing on and dry for 24 hours.

## 2020-09-06 NOTE — Discharge Summary (Addendum)
Discharge Summary    Patient ID: Ryan [name] MRN: [id number]; DOB: 1946-08-26  Admit date: 08/25/2020 Discharge date: 09/06/2020  Primary Care Provider: [name], MD  Primary Cardiologist: [name], MD  Primary Electrophysiologist:  Constance Haw, MD   Discharge Diagnoses    Principal Problem:   Acute on chronic combined systolic and diastolic CHF (congestive heart failure) ([place]) Active Problems:   Status post mitral valve replacement with bioprosthetic valve   Junctional tachycardia (HCC)   PVC's (premature ventricular contractions)   ICD (implantable cardioverter-defibrillator) in place   CHF (congestive heart failure) ([place])   SVT   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 VA pacing showed conduction at 600 msec.  The patient went into SVT spontaneously.   The tachycardia cycle length was 540 msec. The surface EKG was consistent with the patient's clinical tachycardia. VA time during tachycardia measured 30 msec with earliest retrograde atrial activation recorded from the His electrogram. The tachycardia terminated spontaneously with an atrial activation.  This was a reproducible event. V pacing was performed during tachycardia which revealed a VAV response.  Atrial pacing was performed with AVA response. The patient was therefore felt to have classic AV nodal reentrant tachycardia. I therefore elected to perform slow pathway modification.  Isuprel was discontinued and allowed to washout.    A Ablation:  A 50F Biosense Webster DF 78mm ablation catheter was therefore advanced through the right femoral vein and advanced into the right atrium.  Three-dimensional mapping of  Koch's triangle was performed which revealed a standard sized triangle. A single lesion was delivered at site 9 in Koch's triangle with a target temperature of 60 degrees of 50 watts for 60 seconds. Good accelerated junctional rhythm was observed with intact VA conduction. A second RF lesion was applied at site 8 in Koch's triangle. Accelerated junctional rhythm was again observed however RF was discontinued after 24 seconds due to a single dropped retrograde beat.    Measurements following ablation:  Following ablation, Rapid atrial pacing was again performed with PR<RR and an AV WCL of 490 msec.  AEST was performed which revealed no AH jumps, echo beats, or tachycardias induced.  No arrhythmias were induced. Following ablation the AH interval was 215 msec with an HV interval of 46 msec. The procedure was therefore considered completed. All catheters were removed and the sheaths were aspirated and flushed. The sheaths were removed and hemostasis was assured. EBL<74ml. There were no early apparent complications.    CONCLUSIONS:  1. Sinus rhythm upon presentation.  2. 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 Successful radiofrequency modification of the slow AV nodal pathway  4. No inducible arrhythmias following ablation.  5. No early apparent complications.    RIGHT HEART CATH  08/29/2020   Conclusion  1. Severely elevated right and left heart filling pressures.  2. Near equalization of diastolic pressures, restrictive physiology.  3. Cardiac output relatively preserved.    This is suggestive of decompensated biventricular failure.  Will give IV Lasix and start gtt.  With arrhythmias, would avoid milrinone for now with relatively preserved cardiac  output.  Hopefully renal function will improve with some diuresis and decrease in renal venous pressure. Will leave Swan  in place, if diureses poorly or cardiac output is registered as lower, would use milrinone.  Echo 08/26/2020  1. There is substantial LV setal-lateral dyssynchrony. Left ventricular  ejection fraction, by estimation, is 25 to 30%. The left ventricle has  severely decreased function. The left ventricle demonstrates global  hypokinesis. Left ventricular diastolic  function could not be evaluated.   2. Right ventricular systolic function is severely reduced. The right  ventricular size is severely enlarged. There is moderately elevated  pulmonary artery systolic pressure.   3. Left atrial size was moderately dilated.   4. Right atrial size was severely dilated.   5. The mitral valve has been repaired/replaced. No evidence of mitral  valve regurgitation. No evidence of mitral stenosis. The mean mitral valve  gradient is 6.1 mmHg with average heart rate of 121 bpm. There is a  bioprosthetic valve present in the  mitral position.   6. Tricuspid valve regurgitation is moderate.   7. The aortic valve is tricuspid. Aortic valve regurgitation is not  visualized. No aortic stenosis is present.   8. There is borderline dilatation of the ascending aorta, measuring 37  mm.   9. The inferior vena cava is dilated in size with <50% respiratory  variability, suggesting right atrial pressure of 15 mmHg.   History of Present Illness     Ryan [name] is a 75 y.o. 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   He has a history of endocarditis that resulted in mitral valve replacement in 2014. Heart cath prior to that surgery showed widely patent coronaries and moderate to severe pulmonary  hypertension.  Recently dealing with volume overload. Failed outpatient tx. Seen by Dr. [name] in clinic 08/25/2020 and recommended directed admission via ER given bed issue &  BNP of > 76160. Also had intermittent chest pain. EKG reveals junctional tachycardia in the 120s.  High-sensitivity troponin is minimally elevated at 25--> 30. CXR showed small bilateral pleural effusions.  Hospital Course     Consultants: Advance heart failure and EP  1. 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 Echo with LVEF of 25-30% (previously 30-35%), diffuse HK and severe RV failure. The patient was seen by advanced heart failure for likely low output and worsen renal function due to cardiorenal syndrome. [place] on  12/10 with evidence of severe biventricular failure with PCWP and RA pressure > 30 and PAPi 0.8.  Equalization of diastolic pressures concerning for restrictive physiology.  Cardiac output preserved (CI 2.28 Fick, 2.33 thermo). Required milrinone and then discontinued with stable Co-ox. Scr improved.  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 Plan to coronary angiography as outpatient to assess CAD.  - No ARNI/ARB//digoxin with elevated creatinine.  - No Bidil for now with soft BP, plan to consider if BP stabilizes.  - Continue Toprol XL 25 mg daily.  - Changed from [place] to Jardiance 10 mg due to cost  - Continue spironolactone 12.5mg  daily   2.  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 In and out of NSR. HR not markedly high when in SVT but he seems to have been predominantly in SVT recently.  - S/p Ablation by Dr. [name] on 09/03/2020 - Tolerating BB  3. PVCs - Hx of frequent PVCs - Continue amiodarone 200mg  daily   4. PAF - No afib noted this admission - Anticoagulated with warfarin  - INR 2.6 at discharge  - Warfarin 2.5mg  MWF, 5mg  all other  days  5. 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 Improved w/ milrinone and diuresis  - SCr trending down,  2.17>>1.92>>1.81 > 1.74 at discharge   6. Bioprosthetic mitral valve - Normal functioning valve by echo   7. Deconditioning - PT recommended SNF but he refused. Plan to set up HHPT/RN.    8. ? Right arm cellulitis - Noted swelling on right forearm where IV previously was.  Possible ?cellulitis. Treated with doxy 100mg  BID for 5 days (will complete course as outpatient).   Did the patient have an acute coronary syndrome (MI, NSTEMI, STEMI, etc) this admission?:  No                               Did the patient have a percutaneous coronary intervention (stent / angioplasty)?:  No.     Discharge Vitals Blood pressure 95/82, pulse 68, temperature 98.4 F (36.9 C), temperature source Oral, resp. rate 18, height 6' (1.829 m), weight 74.1 kg, SpO2 97 %.  Filed Weights   09/04/20 0525 09/05/20 0620 09/06/20 0540  Weight: 76 kg 72.9 kg 74.1 kg    Labs & Radiologic Studies    CBC Recent Labs    09/05/20 0450 09/06/20 0521  WBC 7.0 6.0  HGB 10.6* 10.5*  HCT 33.3* 31.4*  MCV 70.9* 70.7*  PLT 126* 335*   Basic Metabolic Panel Recent Labs    09/05/20 0450 09/06/20 0521  NA 137 137  K 3.7 3.9  CL 97* 100  CO2 29 28  GLUCOSE 104* 109*  BUN 39* 38*  CREATININE 1.81* 1.74*  CALCIUM 8.4* 8.4*   High Sensitivity Troponin:   Recent Labs  Lab 08/25/20 1419 08/25/20 2112  TROPONINIHS 25* 30*   _____________  DG Chest 2 View  Result Date: 08/25/2020 CLINICAL DATA:  Chest pain. EXAM: CHEST - 2 VIEW COMPARISON:  June 12, 2016. FINDINGS: Stable cardiomegaly. Status post cardiac valve repair. Left-sided pacemaker is unchanged in position. No pneumothorax is noted. Minimal bibasilar subsegmental atelectasis is noted with small pleural effusions. Bony thorax is unremarkable. IMPRESSION: Minimal bibasilar  subsegmental atelectasis with small pleural effusions. Electronically Signed   By: Marijo Conception M.D.   On: 08/25/2020 14:26   CARDIAC CATHETERIZATION  Result Date: 08/29/2020 1. Severely elevated right and left heart filling pressures. 2. Near equalization of diastolic pressures, restrictive physiology. 3. Cardiac output relatively preserved. This is suggestive of decompensated biventricular failure.  Will give IV Lasix and start gtt.  With arrhythmias, would avoid milrinone for now with relatively preserved cardiac output.  Hopefully renal function will improve with some diuresis and decrease in renal venous pressure. Will leave Swan in place, if diureses poorly or cardiac output is registered as lower, would use milrinone.   EP STUDY  Result Date: 09/03/2020 SURGEON: Allegra Lai, MD PREPROCEDURE DIAGNOSIS: SVT POSTPROCEDURE DIAGNOSIS: Classic AV nodal reentrant tachycardia PROCEDURES: 1. Comprehensive EP study. 2. Coronary  sinus pacing and recording. 3.  Three-dimensional mapping of supraventricular tachycardia. 4. Radiofrequency ablation of supraventricular tachycardia. 5. Arrhythmia induction with isuprel infused INTRODUCTION: TALBERT TREMBATH is a 74 y.o. male with a history of symptomatic recurrent short RP SVT who presents today for EP study and radiofrequency ablation. The patient has had recurrent symptomatic SVT. She has failed medical therapy with verapamil. She now presents for EP study and radiofrequency ablation of SVT. DESCRIPTION OF PROCEDURE: Informed written consent was obtained and the patient was brought to the Electrophysiology Lab in the fasting state. The patient was adequately sedated with intravenous medication as outlined in the anesthesia report. The patient's right neck and groin was prepped and draped in the usual sterile fashion by the EP Lab staff. Using a percutaneous Seldinger technique, one 8 [place] and one 6 [place] hemostasis sheath was placed in the right femoral  vein, one 7 [place] and one 6 [place] hemostasis sheath was placed in the left femoral vein.  Two 6-French quadripolar Josephson catheters were introduced through the right and left common femoral vein and advanced into the His bundle and right ventricular apex positions for recording and pacing.  A Biosense Webster auto ID CS catheter was placed in the coronary sinus through the left femoral vein.  Direct ultrasound guidance is used for right and left femoral veins with normal vessel patency. Ultrasound images are captured and stored in the patient's chart. Using ultrasound guidance, the Brockenbrough needle and wire were visualized entering the vessel. Presenting Measurements: The patient presented to the Electrophysiology Lab in sinus rhythm. The PR interval was 257 msec with a QRS of 103 msec and a Qt of 481 msec. The average RR interval was 808 msec. The AH interval was 129 msec and the HV interval was 51 milliseconds. EP study: Ventricular pacing was performed which reveals midline concentric decremental VA conduction with a single retrograde jump but no echo beats of tachycardias. VA pacing showed conduction at 600 msec. The patient went into SVT spontaneously.  The tachycardia cycle length was 540 msec. The surface EKG was consistent with the patient's clinical tachycardia. VA time during tachycardia measured 30 msec with earliest retrograde atrial activation recorded from the His electrogram. The tachycardia terminated spontaneously with an atrial activation. This was a reproducible event. V pacing was performed during tachycardia which revealed a VAV response.  Atrial pacing was performed with AVA response. The patient was therefore felt to have classic AV nodal reentrant tachycardia. I therefore elected to perform slow pathway modification. Isuprel was discontinued and allowed to washout.   A Ablation: A 35F Biosense Webster DF 37mm ablation catheter was therefore advanced through the right femoral vein and  advanced into the right atrium.  Three-dimensional mapping of Koch's triangle was performed which revealed a standard sized triangle. A single lesion was delivered at site 9 in Koch's triangle with a target temperature of 60 degrees of 50 watts for 60 seconds. Good accelerated junctional rhythm was observed with intact VA conduction. A second RF lesion was applied at site 8 in Koch's triangle. Accelerated junctional rhythm was again observed however RF was discontinued after 24 seconds due to a single dropped retrograde beat. Measurements following ablation: Following ablation, Rapid atrial pacing was again performed with PR<RR and an AV WCL of 490 msec. AEST was performed which revealed no AH jumps, echo beats, or tachycardias induced. No arrhythmias were induced. Following ablation the AH interval was 215 msec with an HV interval of 46 msec. The procedure was  therefore considered completed. All catheters were removed and the sheaths were aspirated and flushed. The sheaths were removed and hemostasis was assured. EBL<62ml. There were no early apparent complications. CONCLUSIONS: 1. Sinus rhythm upon presentation. 2. The patient had dual AV nodal physiology with easily inducible classic AV nodal reentrant tachycardia, there were no other accessory pathways or arrhythmias induced 3. Successful radiofrequency modification of the slow AV nodal pathway 4. No inducible arrhythmias following ablation. 5. No early apparent complications.   DG CHEST PORT 1 VIEW  Result Date: 09/01/2020 CLINICAL DATA:  74 year old male status post PICC line placement. EXAM: PORTABLE CHEST 1 VIEW COMPARISON:  08/25/2020 chest radiographs and earlier. FINDINGS: Portable AP semi upright view at 1542 hours. New right upper extremity approach PICC line in place. Tip is at the lower SVC level about 1 vertebral body below the carina. Stable superimposed left chest AICD. Stable cardiac size and mediastinal contours. Prosthetic cardiac valve.  Prior sternotomy. Allowing for portable technique the lungs are clear. Visualized tracheal air column is within normal limits. Negative visible bowel gas pattern. IMPRESSION: 1. Right upper extremity approach PICC line placed with tip at the lower SVC level. 2. Cardiomegaly.  No acute cardiopulmonary abnormality. Electronically Signed   By: Genevie Ann M.D.   On: 09/01/2020 15:57   ECHOCARDIOGRAM COMPLETE  Result Date: 08/26/2020    ECHOCARDIOGRAM REPORT   Patient Name:   Ryan [name] Date of Exam: 08/26/2020 Medical Rec #:  [id number]        Height:       72.0 in Accession #:    [id number]       Weight:       190.0 lb Date of Birth:  1945-12-27        BSA:          2.085 m Patient Age:    27 years         BP:           109/79 mmHg Patient Gender: M                HR:           118 bpm. Exam Location:  Inpatient Procedure: 2D Echo, Cardiac Doppler and Color Doppler Indications:    Chest pain                 CHF-Acute systolic  History:        Patient has prior history of Echocardiogram examinations, most                 recent 07/20/2019. CHF, CAD, Mitral Valve Disease,                 Arrythmias:Atrial Fibrillation and PVC; Risk                 Factors:Hypertension and Diabetes. S/p mitral valve replacement.                 PVD.                  Mitral Valve: bioprosthetic valve valve is present in the mitral                 position.  Sonographer:    Clayton Lefort RDCS (AE) Referring Phys: [id number] [place]  1. There is substantial LV setal-lateral dyssynchrony. Left ventricular ejection fraction, by estimation, is 25 to 30%. The left ventricle has severely decreased function. The left ventricle demonstrates global hypokinesis.  Left ventricular diastolic function could not be evaluated.  2. Right ventricular systolic function is severely reduced. The right ventricular size is severely enlarged. There is moderately elevated pulmonary artery systolic pressure.  3. Left atrial size was moderately  dilated.  4. Right atrial size was severely dilated.  5. The mitral valve has been repaired/replaced. No evidence of mitral valve regurgitation. No evidence of mitral stenosis. The mean mitral valve gradient is 6.1 mmHg with average heart rate of 121 bpm. There is a bioprosthetic valve present in the mitral position.  6. Tricuspid valve regurgitation is moderate.  7. The aortic valve is tricuspid. Aortic valve regurgitation is not visualized. No aortic stenosis is present.  8. There is borderline dilatation of the ascending aorta, measuring 37 mm.  9. The inferior vena cava is dilated in size with <50% respiratory variability, suggesting right atrial pressure of 15 mmHg. Comparison(s): A prior study was performed on 07/03/2019. Prior images reviewed side by side. The left ventricular function is worsened. There is substantial worsening of right ventricular systolic function. Mitral valve gradients are lower, probably due  to lower cardiac output. There are alternating QRS complex morphologies (V sensed/V paced versus ventricular bigeminy?) throughout the study. Severe LV dyssynchrony is present on both types of beats. FINDINGS  Left Ventricle: There is substantial LV setal-lateral dyssynchrony. Left ventricular ejection fraction, by estimation, is 25 to 30%. The left ventricle has severely decreased function. The left ventricle demonstrates global hypokinesis. The left ventricular internal cavity size was normal in size. There is borderline concentric left ventricular hypertrophy. Abnormal (paradoxical) septal motion consistent with post-operative status. Left ventricular diastolic function could not be evaluated due to mitral valve replacement. Left ventricular diastolic function could not be evaluated. Right Ventricle: The right ventricular size is severely enlarged. No increase in right ventricular wall thickness. Right ventricular systolic function is severely reduced. There is moderately elevated pulmonary  artery systolic pressure. The tricuspid regurgitant velocity is 2.92 m/s, and with an assumed right atrial pressure of 15 mmHg, the estimated right ventricular systolic pressure is 94.1 mmHg. Left Atrium: Left atrial size was moderately dilated. Right Atrium: Right atrial size was severely dilated. Pericardium: There is no evidence of pericardial effusion. Mitral Valve: The mitral valve has been repaired/replaced. No evidence of mitral valve regurgitation. There is a bioprosthetic valve present in the mitral position. No evidence of mitral valve stenosis. The mean mitral valve gradient is 6.1 mmHg with average heart rate of 121 bpm. Tricuspid Valve: The tricuspid valve is normal in structure. Tricuspid valve regurgitation is moderate. Aortic Valve: The aortic valve is tricuspid. Aortic valve regurgitation is not visualized. No aortic stenosis is present. Aortic valve mean gradient measures 1.7 mmHg. Aortic valve peak gradient measures 2.7 mmHg. Aortic valve area, by VTI measures 1.95 cm. Pulmonic Valve: The pulmonic valve was normal in structure. Pulmonic valve regurgitation is mild. Aorta: The aortic root is normal in size and structure. There is borderline dilatation of the ascending aorta, measuring 37 mm. Venous: The inferior vena cava is dilated in size with less than 50% respiratory variability, suggesting right atrial pressure of 15 mmHg. IAS/Shunts: No atrial level shunt detected by color flow Doppler. Additional Comments: A pacer wire is visualized.  LEFT VENTRICLE PLAX 2D LVIDd:         4.70 cm LVIDs:         4.10 cm LV PW:         1.10 cm LV IVS:  1.18 cm LVOT diam:     2.00 cm LV SV:         25 LV SV Index:   12 LVOT Area:     3.14 cm  RIGHT VENTRICLE            IVC RV Basal diam:  5.20 cm    IVC diam: 3.20 cm RV Mid diam:    4.30 cm RV S prime:     5.63 cm/s TAPSE (M-mode): 1.0 cm LEFT ATRIUM             Index       RIGHT ATRIUM           Index LA diam:        4.30 cm 2.06 cm/m  RA Area:      30.50 cm LA Vol (A2C):   96.7 ml 46.39 ml/m RA Volume:   110.00 ml 52.77 ml/m LA Vol (A4C):   62.5 ml 29.98 ml/m LA Biplane Vol: 78.2 ml 37.51 ml/m  AORTIC VALVE AV Area (Vmax):    2.03 cm AV Area (Vmean):   1.86 cm AV Area (VTI):     1.95 cm AV Vmax:           81.70 cm/s AV Vmean:          59.067 cm/s AV VTI:            0.128 m AV Peak Grad:      2.7 mmHg AV Mean Grad:      1.7 mmHg LVOT Vmax:         52.70 cm/s LVOT Vmean:        35.000 cm/s LVOT VTI:          0.079 m LVOT/AV VTI ratio: 0.62  AORTA Ao Root diam: 3.56 cm Ao Asc diam:  3.70 cm MITRAL VALVE            TRICUSPID VALVE MV Area (PHT): 3.73 cm TR Peak grad:   34.1 mmHg MV Mean grad:  6.1 mmHg TR Vmax:        292.00 cm/s MV Decel Time: 203 msec                         SHUNTS                         Systemic VTI:  0.08 m                         Systemic Diam: 2.00 cm Dani Gobble Croitoru MD Electronically signed by Sanda Klein MD Signature Date/Time: 08/26/2020/4:59:49 PM    Final    CUP PACEART INCLINIC DEVICE CHECK  Result Date: 08/25/2020 ICD check in clinic. Normal device function. Unable to check threshold testing due to AS?VS rate of 133 . Impedance trends stable over time. AT/AF burden 0.3%, 7 AMS events with EGMs that show AT, longest 43 minutes. No ventricular arrhythmias. Histogram  distribution appropriate for patient and level of activity. No changes made this session. Device programmed at appropriate safety margins. Optivol elevated and patient symptomatic WC aware of findings.Device programmed to optimize intrinsic conduction. Estimated longevity 5 years, 3 months. Pt enrolled in remote follow-up and next remote 09/15/20. Patient education completed including shock plan. Auditory/vibratory alert demonstrated. ICD check in clinic. Normal device function. Unable to check threshold testing due to AS?VS rate of 133 . Impedance trends stable over time.  AT/AF burden 0.3%, 7 AMS events with EGMs that show AT, longest 43 minutes. No  ventricular arrhythmias. Histogram  distribution appropriate for patient and level of activity. No changes made this session. Device programmed at appropriate safety margins. Optivol elevated and patient symptomatic WC aware of findings.Device programmed to optimize intrinsic conduction. Estimated longevity 5 years, 3 months. Pt enrolled in remote follow-up and next remote 09/15/20. Patient education completed including shock plan. Auditory/vibratory alert demonstrated.  [place] EKG SITE RITE  Result Date: 09/01/2020 If Site Rite image not attached, placement could not be confirmed due to current cardiac rhythm.  Disposition   Pt is being discharged home today in good condition.  Follow-up Plans & Appointments     Follow-up Information     Constance Haw, MD Follow up on 10/23/2020.   Specialty: Cardiology Why: at 330 pm for post ablation follow up Contact information: [address] [place] [address] [phone]         [place] Follow up on 09/23/2020.   Specialty: Cardiology Why: 3:30 PM  Advanced Heart Failure Clinic at [facility], [place] Code 3007 Contact information: [address] [id number] [place]        [name], MD Follow up.   Specialty: Internal Medicine Why: make appointment with PCP on either 12/21 or 12/22 for INR check and hospital follow up  Contact information: [address] [place] [address] [phone]                Discharge Instructions     Diet - low sodium heart healthy   Complete by: As directed    Increase activity slowly   Complete by: As directed        Discharge Medications   Allergies as of 09/06/2020       Reactions   Geralyn Flash [fish Allergy] Nausea And Vomiting        Medication List     STOP taking these medications    Entresto 24-26 MG Generic drug:  sacubitril-valsartan   mexiletine 250 MG capsule Commonly known as: MEXITIL       TAKE these medications    acetaminophen 325 MG tablet Commonly known as: TYLENOL Take 650 mg by mouth every 6 (six) hours as needed for mild pain or headache.   amiodarone 200 MG tablet Commonly known as: PACERONE Take 1 tablet (200 mg total) by mouth daily. Start taking on: September 07, 2020   atorvastatin 40 MG tablet Commonly known as: LIPITOR Take 1 tablet (40 mg total) by mouth daily at 6 PM.   CENTRUM SILVER PO Take 1 tablet by mouth 3 (three) times a week.   doxycycline 100 MG tablet Commonly known as: VIBRA-TABS Take 1 tablet (100 mg total) by mouth 2 (two) times daily.   empagliflozin 10 MG Tabs tablet Commonly known as: JARDIANCE Take 1 tablet (10 mg total) by mouth daily. Start taking on: September 07, 2020   furosemide 40 MG tablet Commonly known as: LASIX Take 1 tablet (40 mg total) by mouth daily. Start taking on: September 07, 2020   HYDROcodone-acetaminophen 5-325 MG tablet Commonly known as: NORCO/VICODIN Take 1 tablet by mouth 2 (two) times daily as needed for moderate pain.   metoprolol succinate 25 MG 24 hr tablet Commonly known as: TOPROL-XL Take 1 tablet (25 mg total) by mouth daily. Start taking on: September 07, 2020  pantoprazole 40 MG tablet Commonly known as: PROTONIX Take 40 mg by mouth daily.   potassium chloride SA 20 MEQ tablet Commonly known as: KLOR-CON Take 1 tablet (20 mEq total) by mouth daily.   spironolactone 25 MG tablet Commonly known as: ALDACTONE Take 0.5 tablets (12.5 mg total) by mouth daily. Start taking on: September 07, 2020   tamsulosin 0.4 MG Caps capsule Commonly known as: FLOMAX Take 0.4 mg by mouth daily.   triamcinolone cream 0.5 % Commonly known as: KENALOG Apply 1 application topically daily as needed (skin rash).   warfarin 2.5 MG tablet Commonly known as: Coumadin Take 2.5mg  ( 1 tablet ) Monday, wednesday and  Friday  Other days take 5mg  (2 tablets) What changed:   medication strength  how much to take  how to take this  when to take this  additional instructions           Outstanding Labs/Studies   BMET at follow up INR at PCP office on either 12/21 or 12/22  Duration of Discharge Encounter   Greater than 30 minutes including physician time.  SignedCrista Luria [place], PA 09/06/2020, 1:13 PM  Agree with above. Stable for d/c.   Glori Bickers, MD  7:30 PM

## 2020-09-06 NOTE — Progress Notes (Deleted)
Patient complaining of bladder fullness. Bladder scan showed 731 mL. In and out cath performed per order; 950 mL of clear yellow urine obtained.

## 2020-09-06 NOTE — TOC Transition Note (Signed)
Transition of Care [facility]) - CM/SW Discharge Note   Patient Details  Name: Ryan [name] MRN: [id number] Date of Birth: 22-Jun-1946  Transition of Care [facility]) CM/SW Contact:  Zenon Mayo, RN Phone Number: 09/06/2020, 1:28 PM   Clinical Narrative:    Patient is for dc today, NCM offered choice, he states [facility] will be ok . NCM made referral to [facility] with [facility] for [facility], HHPT,  He is able to take referral. Soc will begin 24 to 48 hrs post dc.  He states he has transportation at [employer]. He states he has no issues getting his medications.    Final next level of care: [place] Barriers to Discharge: No Barriers Identified   Patient Goals and CMS Choice Patient states their goals for this hospitalization and ongoing recovery are:: get better CMS Medicare.gov Compare Post Acute Care list provided to:: Patient Choice offered to / list presented to : Patient  Discharge Placement                       Discharge Plan and Services                  DME Agency: NA       HH Arranged: PT,Disease Management,RN HH Agency: [place] Date [facility] Agency Contacted: 09/06/20 Time [place]: [id number] Representative spoke with at [place]: cory  Social Determinants of Health (SDOH) Interventions Food Insecurity Interventions: Intervention Not Indicated Transportation Interventions: Intervention Not Indicated   Readmission Risk Interventions No flowsheet data found.

## 2020-09-06 NOTE — TOC Progression Note (Addendum)
Transition of Care [facility]) - Progression Note    Patient Details  Name: NORAH FICK MRN: [id number] Date of Birth: May 26, 1946  Transition of Care [facility]) CM/SW [place], [place] Phone Number: [phone] 09/06/2020, 12:01 PM  Clinical Narrative:     CSW met with patient to provide bed offers. Patient explained at this time he would like to return home with [facility]. Patient has changed his mind about going to a SNF. Patient states that his son can assist him at home.   CSW alerted MD and RN case Freight forwarder.  TOC team will continue to assist with discharge planning needs.  Expected Discharge Plan: [place] Barriers to Discharge: Continued Medical Work up  Expected Discharge Plan and Services Expected Discharge Plan: [place] arrangements for the past 2 months: Single Family Home                                       Social Determinants of Health (SDOH) Interventions    Readmission Risk Interventions No flowsheet data found.

## 2020-09-06 NOTE — Progress Notes (Signed)
ANTICOAGULATION CONSULT NOTE - Follow Up Consult  Pharmacy Consult for Coumadin Indication: atrial fibrillation  Allergies  Allergen Reactions  . Tuna [Fish Allergy] Nausea And Vomiting    Patient Measurements: Height: 6' (182.9 cm) Weight: 74.1 kg (163 lb 5.8 oz) IBW/kg (Calculated) : 77.6  Vital Signs: Temp: 98.4 F (36.9 C) (12/18 0754) Temp Source: Oral (12/18 0754) BP: 103/65 (12/18 0754) Pulse Rate: 68 (12/18 0754)  Labs: Recent Labs    09/04/20 0453 09/05/20 0450 09/06/20 0521  HGB 10.8* 10.6* 10.5*  HCT 32.3* 33.3* 31.4*  PLT 113* 126* 135*  LABPROT 30.7* 27.2* 26.8*  INR 3.1* 2.6* 2.6*  CREATININE 1.92* 1.81* 1.74*    Estimated Creatinine Clearance: 37.9 mL/min (A) (by C-G formula based on SCr of 1.74 mg/dL (H)).   Medical History: Past Medical History:  Diagnosis Date  . AICD (automatic cardioverter/defibrillator) present   . Atrial fibrillation ([place])   . 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 CHF (congestive heart failure) ([place])   . Diabetes mellitus without complication ([place])   . Endocarditis   . Hypertension   . Peripheral vascular disease ([place])   . PVC's (premature ventricular contractions)   . SVT (supraventricular tachycardia)  long RP     Assessment: 74 yo male presented on 08/25/2020 as a direct admission from EP clinic for significant SOB with minimal exertion, PND, and orthopnea. Patient is s/p bioprosthetic mitral valve repair in 2014 and has AFib. Pt on warfarin PTA, INR 1.9 on admit.  INR this morning therapeutic at 2.6, CBC stable. New amiodarone converted from IV to PO. Pt will likely need reduced discharge regimen (ex. 2.5mg  MWF, 5mg  all other days).  Prior to admission warfarin regimen: warfarin 5mg  daily   Goal of Therapy:  INR 2-3 Monitor platelets by anticoagulation protocol: Yes   Plan:  Warfarin 5mg  x1 tonight Daily INR  Fara Olden, PharmD PGY-1 Pharmacy Resident 09/06/2020 8:32 AM Please see AMION for all pharmacy  numbers

## 2020-09-08 NOTE — Progress Notes (Signed)
CARDIOLOGY OFFICE NOTE  Date:  09/22/2020    Ryan [name] Date of Birth: February 22, 1946 Medical Record #[id number]  PCP:  [name], MD  Cardiologist:  [place]    Chief Complaint  Patient presents with  . Follow-up    Seen for Dr. Smith/Camnitz/Bensimhon    History of Present Illness: Ryan [name] is a 74 y.o. male who presents today for a follow up/post hospital/post ablation visit. Seen for Dr. Tamala Julian. Sees Dr. [name] for EP. Also now to follow in the CHF clinic.   He has a history of SBE - with prior AVR in 2014, PAF/flutter, SVT, PVCs with high burden - on amiodarone then [name] and then Mexiletine, chronic systolic HF, HTN, PVD, embolic CVA and chronic anticoagulation. He has a ICD/PPM in place. Has had high PVC burden with prior EF of 30 to 35%.   Last seen in July by Dr. Tamala Julian. Saw Dr. [name] a month ago - got admitted for volume overload - had failed on outpatient treatment. BNP over 21000 - HR in the 120's - junctional tachycardia. Has undergone SVT ablation a few weeks ago. EF down further - has had right heart cath. For possible left heart cath at some point in the near future.    Comes in today. Here with his daughter. He developed a UTI since his recent hospital discharge. Just started Levaquin this past Friday - apparently could not get Cipro - could not have Bactrim due to his Coumadin. His appetite has been off. He says he is doing ok. He notes some upper abdominal discomfort - says this is chest pain - comes and goes - that just started this morning. Laid down earlier this morning. She notes he is sleeping more. His weight is down almost 40 pounds over the past month. She is worried he is "too dried out". Seeing AHF tomorrow. No palpitations noted. He is not really dizzy.   Past Medical History:  Diagnosis Date  . AICD (automatic cardioverter/defibrillator) present   . Atrial fibrillation ([place])   . 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 CHF (congestive  heart failure) ([place])   . Diabetes mellitus without complication ([place])   . Endocarditis   . Hypertension   . Peripheral vascular disease ([place])   . PVC's (premature ventricular contractions)   . SVT (supraventricular tachycardia)  long RP     Past Surgical History:  Procedure Laterality Date  . ABDOMINAL AORTAGRAM N/A 10/03/2012   Procedure: ABDOMINAL Maxcine Ham;  Surgeon: [name], MD;  Location: [facility] CATH LAB;  Service: Cardiovascular;  Laterality: N/A;  . CORONARY ANGIOGRAM  09/21/2012   Procedure: CORONARY ANGIOGRAM;  Surgeon: [name], MD;  Location: [facility] CATH LAB;  Service: Cardiovascular;;  . EP IMPLANTABLE DEVICE N/A 06/11/2016   Procedure: ICD Implant;  Surgeon: Will Meredith Leeds, MD;  Location: [place] CV LAB;  Service: Cardiovascular;  Laterality: N/A;  . EXTREMITY WIRE/PIN REMOVAL  09/14/2012   Procedure: REMOVAL K-WIRE/PIN EXTREMITY;  Surgeon: [name], MD;  Location: [place];  Service: Orthopedics;  Laterality: Right;  Right Foot  . I & D EXTREMITY  09/14/2012   Procedure: IRRIGATION AND DEBRIDEMENT EXTREMITY;  Surgeon: Tennis Must, MD;  Location: [place];  Service: Orthopedics;  Laterality: Right;  . INTRAOPERATIVE TRANSESOPHAGEAL ECHOCARDIOGRAM  09/26/2012   Procedure: INTRAOPERATIVE TRANSESOPHAGEAL ECHOCARDIOGRAM;  Surgeon: [name], MD;  Location: [facility] OR;  Service: Open Heart Surgery;  Laterality: N/A;  . MITRAL VALVE  REPLACEMENT  09/26/2012   Procedure: MITRAL VALVE (MV) REPLACEMENT;  Surgeon: [name], MD;  Location: [place] OR;  Service: Open Heart Surgery;  Laterality: N/A;  . RIGHT HEART CATH N/A 08/29/2020   Procedure: RIGHT HEART CATH;  Surgeon: Larey Dresser, MD;  Location: [place] CV LAB;  Service: Cardiovascular;  Laterality: N/A;  . RIGHT HEART CATHETERIZATION  09/21/2012   Procedure: RIGHT HEART CATH;  Surgeon: [name], MD;  Location: [facility] CATH LAB;  Service: Cardiovascular;;  . SVT ABLATION N/A 09/03/2020   Procedure: SVT  ABLATION;  Surgeon: Constance Haw, MD;  Location: [place] CV LAB;  Service: Cardiovascular;  Laterality: N/A;  . TEE WITHOUT CARDIOVERSION  09/18/2012   Procedure: TRANSESOPHAGEAL ECHOCARDIOGRAM (TEE);  Surgeon: [name], MD;  Location: [facility] ENDOSCOPY;  Service: Cardiovascular;  Laterality: N/A;     Medications: Current Meds  Medication Sig  . acetaminophen (TYLENOL) 325 MG tablet Take 650 mg by mouth every 6 (six) hours as needed for mild pain or headache.  Marland Kitchen amiodarone (PACERONE) 200 MG tablet Take 1 tablet (200 mg total) by mouth daily.  Marland Kitchen atorvastatin (LIPITOR) 40 MG tablet Take 1 tablet (40 mg total) by mouth daily at 6 PM.  . empagliflozin (JARDIANCE) 10 MG TABS tablet Take 1 tablet (10 mg total) by mouth daily.  . furosemide (LASIX) 40 MG tablet Take 1 tablet (40 mg total) by mouth daily.  Marland Kitchen HYDROcodone-acetaminophen (NORCO/VICODIN) 5-325 MG per tablet Take 1 tablet by mouth 2 (two) times daily as needed for moderate pain.   . metoprolol succinate (TOPROL-XL) 25 MG 24 hr tablet Take 1 tablet (25 mg total) by mouth daily.  . Multiple Vitamins-Minerals (CENTRUM SILVER PO) Take 1 tablet by mouth 3 (three) times a week.  . pantoprazole (PROTONIX) 40 MG tablet Take 40 mg by mouth daily.   . potassium chloride SA (KLOR-CON) 20 MEQ tablet Take 1 tablet (20 mEq total) by mouth daily.  Marland Kitchen spironolactone (ALDACTONE) 25 MG tablet Take 0.5 tablets (12.5 mg total) by mouth daily.  Marland Kitchen sulfamethoxazole-trimethoprim (BACTRIM DS) 800-160 MG tablet Take 1 tablet by mouth 2 (two) times daily.  . tamsulosin (FLOMAX) 0.4 MG CAPS capsule Take 0.4 mg by mouth daily.   Marland Kitchen triamcinolone cream (KENALOG) 0.5 % Apply 1 application topically daily as needed (skin rash).   . warfarin (COUMADIN) 2.5 MG tablet Take 2.5mg  ( 1 tablet ) Monday, wednesday and Friday  Other days take 5mg  (2 tablets)  . [DISCONTINUED] doxycycline (VIBRA-TABS) 100 MG tablet Take 1 tablet (100 mg total) by mouth 2 (two) times  daily.  . [DISCONTINUED] levofloxacin (LEVAQUIN) 500 MG tablet Take 500 mg by mouth daily.     Allergies: Allergies  Allergen Reactions  . Geralyn Flash [Fish Allergy] Nausea And Vomiting    Social History: The patient  reports that he has never smoked. He has never used smokeless tobacco. He reports that he does not drink alcohol and does not use drugs.   Family History: The patient's family history includes Hypertension in his father and mother.   Review of Systems: Please see the history of present illness.   All other systems are reviewed and negative.   Physical Exam: VS:  BP 120/84   Pulse 69   Ht 6' (1.829 m)   Wt 154 lb 6.4 oz (70 kg)   SpO2 96%   BMI 20.94 kg/m  .  BMI Body mass index is 20.94 kg/m.  Wt Readings from Last 3 Encounters:  09/22/20 154 lb 6.4 oz (70 kg)  09/06/20 163 lb 5.8 oz (74.1 kg)  08/25/20 191 lb (86.6 kg)    General:  He looks chronically ill but alert and in no acute distress. Marked weight loss is noted.   His oral mucosa is dry.  Cardiac: Regular rate and rhythm. No murmurs, rubs, or gallops. He has some palpable upper epigastric tenderness noted. No edema.  Respiratory:  Lungs are clear to auscultation bilaterally with normal work of breathing.  GI: Soft and nontender.  MS: No deformity or atrophy. Gait and ROM intact. Using a walker.  Skin: Warm and dry. Color is normal.  Neuro:  Strength and sensation are intact and no gross focal deficits noted.  Psych: Alert, appropriate and with normal affect.   LABORATORY DATA:  EKG:  EKG is ordered today.  Personally reviewed by me. This demonstrates probable accelerated junctional rhythm - versus sinus with U wave and very long 1st degree AB block - his heart rate is down - HR is 69 today. QT is 438 ms.   Lab Results  Component Value Date   WBC 6.0 09/06/2020   HGB 10.5 (L) 09/06/2020   HCT 31.4 (L) 09/06/2020   PLT 135 (L) 09/06/2020   GLUCOSE 109 (H) 09/06/2020   CHOL 131 08/27/2020   TRIG  42 08/27/2020   HDL 40 (L) 08/27/2020   LDLCALC 83 08/27/2020   ALT 30 06/26/2019   AST 33 06/26/2019   NA 137 09/06/2020   K 3.9 09/06/2020   CL 100 09/06/2020   CREATININE 1.74 (H) 09/06/2020   BUN 38 (H) 09/06/2020   CO2 28 09/06/2020   TSH 1.580 06/26/2019   INR 2.6 (H) 09/06/2020   HGBA1C 6.5 (H) 09/14/2012   Lab Results  Component Value Date   INR 2.6 (H) 09/06/2020   INR 2.6 (H) 09/05/2020   INR 3.1 (H) 09/04/2020     BNP (last 3 results) Recent Labs    08/26/20 0622  BNP 2,235.1*    ProBNP (last 3 results) Recent Labs    08/25/20 1121  PROBNP 21,395*     Other Studies Reviewed Today:   SVT ABLATION CONCLUSIONS 08/2020:  1. Sinus rhythm upon presentation.  2. 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 Successful radiofrequency modification of the slow AV nodal pathway  4. No inducible arrhythmias following ablation.  5. No early apparent complications.    RIGHT HEART CATH  08/29/2020   Conclusion  1. Severely elevated right and left heart filling pressures.  2. Near equalization of diastolic pressures, restrictive physiology.  3. Cardiac output relatively preserved.   This is suggestive of decompensated biventricular failure. Will give IV Lasix and start gtt. With arrhythmias, would avoid milrinone for now with relatively preserved cardiac output. Hopefully renal function will improve with some diuresis and decrease in renal venous pressure. Will leave Swan in place, if diureses poorly or cardiac output is registered as lower, would use milrinone.  Echo 08/26/2020 1. There is substantial LV setal-lateral dyssynchrony. Left ventricular  ejection fraction, by estimation, is 25 to 30%. The left ventricle has  severely decreased function. The left ventricle demonstrates global  hypokinesis. Left ventricular diastolic   function could not be evaluated.  2. Right ventricular systolic function is severely reduced. The right  ventricular size is severely enlarged. There is moderately elevated  pulmonary artery systolic pressure.  3. Left atrial size was moderately dilated.  4. Right atrial size was severely dilated.  5. The mitral valve has been repaired/replaced. No evidence of mitral  valve regurgitation. No evidence of mitral stenosis. The mean mitral valve  gradient is 6.1 mmHg with average heart rate of 121 bpm. There is a  bioprosthetic valve present in the  mitral position.  6. Tricuspid valve regurgitation is moderate.  7. The aortic valve is tricuspid. Aortic valve regurgitation is not  visualized. No aortic stenosis is present.  8. There is borderline dilatation of the ascending aorta, measuring 37  mm.  9. The inferior vena cava is dilated in size with <50% respiratory  variability, suggesting right atrial pressure of 15 mmHg.     ASSESSMENT AND PLAN:   1. 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 Massive weight loss over the past month. Will hold Lasix and potassium for next 3 days.   2. CKD - not on ARNI/ARB  3. SVT - s/p recent ablation - his HR has improved - he remains on amiodarone and Toprol.   4. UTI - given Levaquin - worry about prolonged QT - would favor trying Septra and monitoring his INR - daughter will check with PCP - going back there on Friday and can have INR checked.   5. History of PVCs - on amiodarone  6. PAF - not noted.   7. Chronic anticoagulation with Coumadin - monitored by PCP  8. Deconditioning - still seems to be an issue in my opinion. May be worsened by UTI.   9. Palpable upper epigastric pain - would follow.   Current medicines are reviewed with the  patient today.  The patient does not have concerns regarding medicines other than what has been noted above.  The following changes have been made:  See above.  Labs/ tests ordered today include:    Orders Placed This Encounter  Procedures  . Basic metabolic panel  . CBC  . EKG 12-Lead     Disposition:   FU with the AHF team tomorrow and EP as planned. Overall prognosis looks tenuous to me. Labs today.    Patient is agreeable to this plan and will call if any problems develop in the interim.   SignedTruitt Merle, NP  09/22/2020 2:30 PM  Ryan [name], Ryan  [name] Phone: [phone] Fax: [phone]

## 2020-09-09 ENCOUNTER — Telehealth: Payer: Self-pay | Admitting: Cardiology

## 2020-09-09 ENCOUNTER — Other Ambulatory Visit: Payer: Self-pay

## 2020-09-09 NOTE — Telephone Encounter (Signed)
Follow Up:     Pt was supposed to have been getting a referral for [place] with [facility], still have not received it. This referral needs to go to the [place] office please.

## 2020-09-09 NOTE — Telephone Encounter (Signed)
I will forward to primary card nurse, as I am not seeing a referral hor [place].

## 2020-09-09 NOTE — Telephone Encounter (Signed)
Spoke with pt and obtained permission to speak with daughter, [name].  Spoke with daughter and made her aware that per case management note in the computer, Tommi Rumps, with [facility] was notified of the need for [facility].  Advised can take a few business days to hear from them to get everything set up.  Reminded daughter of appt scheduled 1/3.  Daughter appreciative for call.

## 2020-09-09 NOTE — Patient Outreach (Signed)
[name]  09/09/2020  Ryan [name] [birth year] [id number]   EMMI- General Discharge RED ON EMMI ALERT Day # 1 Date: 09/09/20 Red Alert Reason:  Read discharge papers? No  Unfilled prescriptions? Yes    Outreach attempt: Telephone call to patient. He reports he is doing better.  Discussed red alerts with patient patient. He states that he has not reviewed his discharge but that is daughter-in-law in handling that.  Patient states he has all his medications now.  Discussed scheduled follow up with cardiology. Patient is aware of appointment and has no problems with transportation.  Discussed [facility] services and support.  Patient declined services at this time.     Plan: RN CM will close case.    Ryan Baseman, RN, MSN [facility] Care Management Care Management Coordinator Direct Line [phone] Toll Free: [phone]  Fax: [phone]

## 2020-09-11 DIAGNOSIS — N1831 Chronic kidney disease, stage 3a: Secondary | ICD-10-CM | POA: Diagnosis not present

## 2020-09-11 DIAGNOSIS — Z7901 Long term (current) use of anticoagulants: Secondary | ICD-10-CM | POA: Diagnosis not present

## 2020-09-11 DIAGNOSIS — I10 Essential (primary) hypertension: Secondary | ICD-10-CM | POA: Diagnosis not present

## 2020-09-11 DIAGNOSIS — I482 Chronic atrial fibrillation, unspecified: Secondary | ICD-10-CM | POA: Diagnosis not present

## 2020-09-11 DIAGNOSIS — E118 Type 2 diabetes mellitus with unspecified complications: Secondary | ICD-10-CM | POA: Diagnosis not present

## 2020-09-11 DIAGNOSIS — I429 Cardiomyopathy, unspecified: Secondary | ICD-10-CM | POA: Diagnosis not present

## 2020-09-11 DIAGNOSIS — N529 Male erectile dysfunction, unspecified: Secondary | ICD-10-CM | POA: Diagnosis not present

## 2020-09-11 DIAGNOSIS — I509 Heart failure, unspecified: Secondary | ICD-10-CM | POA: Diagnosis not present

## 2020-09-11 DIAGNOSIS — J309 Allergic rhinitis, unspecified: Secondary | ICD-10-CM | POA: Diagnosis not present

## 2020-09-11 DIAGNOSIS — N1832 Chronic kidney disease, stage 3b: Secondary | ICD-10-CM | POA: Diagnosis not present

## 2020-09-15 ENCOUNTER — Other Ambulatory Visit: Payer: Self-pay

## 2020-09-15 ENCOUNTER — Ambulatory Visit (INDEPENDENT_AMBULATORY_CARE_PROVIDER_SITE_OTHER): Payer: Medicare HMO

## 2020-09-15 DIAGNOSIS — I5022 Chronic systolic (congestive) heart failure: Secondary | ICD-10-CM

## 2020-09-15 DIAGNOSIS — I428 Other cardiomyopathies: Secondary | ICD-10-CM

## 2020-09-15 NOTE — Patient Outreach (Signed)
[facility]) Care Management  09/15/2020  ELIM ECONOMOU 10-13-1945 [id number]   EMMI- General Discharge RED ON EMMI ALERT Day # 4 Date: 09/11/20 Red Alert Reason: Other questions/problems? Yes   Outreach attempt: spoke with patient.  He states he is doing good.  Addressed red alert.  Patient reports he is not having any problems.  Advised that the system may have recorded incorrectly. He verbalized understanding and declined any further follow up at this time.     Plan: RN CM will close case.    Jone Baseman, RN, MSN [facility] Care Management Care Management Coordinator Direct Line [phone] Toll Free: [phone]  Fax: [phone]

## 2020-09-16 LAB — CUP PACEART REMOTE DEVICE CHECK
Battery Remaining Longevity: 57 mo
Battery Voltage: 2.98 V
Brady Statistic AP VP Percent: 0.56 %
Brady Statistic AP VS Percent: 29.65 %
Brady Statistic AS VP Percent: 0.09 %
Brady Statistic AS VS Percent: 69.69 %
Brady Statistic RA Percent Paced: 28.28 %
Brady Statistic RV Percent Paced: 0.6 %
Date Time Interrogation Session: 20211227001807
HighPow Impedance: 65 Ohm
Implantable Lead Implant Date: 20170922
Implantable Lead Implant Date: 20170922
Implantable Lead Location: 753859
Implantable Lead Location: 753860
Implantable Lead Model: 5076
Implantable Pulse Generator Implant Date: 20170922
Lead Channel Impedance Value: 285 Ohm
Lead Channel Impedance Value: 361 Ohm
Lead Channel Impedance Value: 399 Ohm
Lead Channel Pacing Threshold Amplitude: 0.625 V
Lead Channel Pacing Threshold Amplitude: 0.625 V
Lead Channel Pacing Threshold Pulse Width: 0.4 ms
Lead Channel Pacing Threshold Pulse Width: 0.4 ms
Lead Channel Sensing Intrinsic Amplitude: 1.625 mV
Lead Channel Sensing Intrinsic Amplitude: 1.625 mV
Lead Channel Sensing Intrinsic Amplitude: 9.625 mV
Lead Channel Sensing Intrinsic Amplitude: 9.625 mV
Lead Channel Setting Pacing Amplitude: 2 V
Lead Channel Setting Pacing Amplitude: 2.5 V
Lead Channel Setting Pacing Pulse Width: 0.4 ms
Lead Channel Setting Sensing Sensitivity: 0.3 mV

## 2020-09-18 DIAGNOSIS — I429 Cardiomyopathy, unspecified: Secondary | ICD-10-CM | POA: Diagnosis not present

## 2020-09-18 DIAGNOSIS — I509 Heart failure, unspecified: Secondary | ICD-10-CM | POA: Diagnosis not present

## 2020-09-18 DIAGNOSIS — N390 Urinary tract infection, site not specified: Secondary | ICD-10-CM | POA: Diagnosis not present

## 2020-09-18 DIAGNOSIS — M25552 Pain in left hip: Secondary | ICD-10-CM | POA: Diagnosis not present

## 2020-09-18 DIAGNOSIS — E118 Type 2 diabetes mellitus with unspecified complications: Secondary | ICD-10-CM | POA: Diagnosis not present

## 2020-09-18 DIAGNOSIS — J309 Allergic rhinitis, unspecified: Secondary | ICD-10-CM | POA: Diagnosis not present

## 2020-09-18 DIAGNOSIS — N529 Male erectile dysfunction, unspecified: Secondary | ICD-10-CM | POA: Diagnosis not present

## 2020-09-18 DIAGNOSIS — N1832 Chronic kidney disease, stage 3b: Secondary | ICD-10-CM | POA: Diagnosis not present

## 2020-09-18 DIAGNOSIS — I482 Chronic atrial fibrillation, unspecified: Secondary | ICD-10-CM | POA: Diagnosis not present

## 2020-09-22 ENCOUNTER — Encounter: Payer: Self-pay | Admitting: Nurse Practitioner

## 2020-09-22 ENCOUNTER — Ambulatory Visit (INDEPENDENT_AMBULATORY_CARE_PROVIDER_SITE_OTHER): Payer: Medicare HMO | Admitting: Nurse Practitioner

## 2020-09-22 ENCOUNTER — Other Ambulatory Visit: Payer: Self-pay

## 2020-09-22 VITALS — BP 120/84 | HR 69 | Ht 72.0 in | Wt 154.4 lb

## 2020-09-22 DIAGNOSIS — I428 Other cardiomyopathies: Secondary | ICD-10-CM

## 2020-09-22 DIAGNOSIS — Z79899 Other long term (current) drug therapy: Secondary | ICD-10-CM | POA: Diagnosis not present

## 2020-09-22 DIAGNOSIS — I10 Essential (primary) hypertension: Secondary | ICD-10-CM | POA: Diagnosis not present

## 2020-09-22 DIAGNOSIS — I480 Paroxysmal atrial fibrillation: Secondary | ICD-10-CM

## 2020-09-22 DIAGNOSIS — I5022 Chronic systolic (congestive) heart failure: Secondary | ICD-10-CM | POA: Diagnosis not present

## 2020-09-22 MED ORDER — SULFAMETHOXAZOLE-TRIMETHOPRIM 800-160 MG PO TABS: 1.0000 | ORAL_TABLET | Freq: Two times a day (BID) | ORAL | 0 refills | Status: DC

## 2020-09-22 NOTE — Patient Instructions (Addendum)
After Visit Summary:  We will be checking the following labs today - BMET & CBC   Medication Instructions:    Continue with your current medicines. BUT  STOP the Levaquin  Let's try Septra DS for your UTI - talk with Dr. Truman Hayward about this. Get your protime checked later this week.   Hold the Lasix and potassium for the next 3 days.    If you need a refill on your cardiac medications before your next appointment, please call your pharmacy.     Testing/Procedures To Be Arranged:  N/A  Follow-Up:   See the providers in heart failure tomorrow.     At [facility], you and your health needs are our priority.  As part of our continuing mission to provide you with exceptional heart care, we have created designated Provider Care Teams.  These Care Teams include your primary Cardiologist (physician) and Advanced Practice Providers (APPs -  Physician Assistants and Nurse Practitioners) who all work together to provide you with the care you need, when you need it.  Special Instructions:  . Stay safe, wash your hands for at least 20 seconds and wear a mask when needed.  . It was good to talk with you today.    Call the [place] office at [phone] if you have any questions, problems or concerns.

## 2020-09-23 ENCOUNTER — Ambulatory Visit (HOSPITAL_COMMUNITY)
Admit: 2020-09-23 | Discharge: 2020-09-23 | Disposition: A | Discharge status: Home / Self Care | Payer: Medicare HMO | Attending: Cardiology | Admitting: Cardiology

## 2020-09-23 ENCOUNTER — Telehealth (HOSPITAL_COMMUNITY): Payer: Self-pay | Admitting: Cardiology

## 2020-09-23 ENCOUNTER — Encounter (HOSPITAL_COMMUNITY): Payer: Self-pay

## 2020-09-23 VITALS — BP 106/80 | HR 71 | Wt 157.8 lb

## 2020-09-23 DIAGNOSIS — I272 Pulmonary hypertension, unspecified: Secondary | ICD-10-CM | POA: Insufficient documentation

## 2020-09-23 DIAGNOSIS — Z953 Presence of xenogenic heart valve: Secondary | ICD-10-CM | POA: Diagnosis not present

## 2020-09-23 DIAGNOSIS — Z95810 Presence of automatic (implantable) cardiac defibrillator: Secondary | ICD-10-CM | POA: Diagnosis not present

## 2020-09-23 DIAGNOSIS — I5082 Biventricular heart failure: Secondary | ICD-10-CM | POA: Diagnosis not present

## 2020-09-23 DIAGNOSIS — I5022 Chronic systolic (congestive) heart failure: Secondary | ICD-10-CM | POA: Diagnosis not present

## 2020-09-23 DIAGNOSIS — I480 Paroxysmal atrial fibrillation: Secondary | ICD-10-CM | POA: Insufficient documentation

## 2020-09-23 DIAGNOSIS — Z8673 Personal history of transient ischemic attack (TIA), and cerebral infarction without residual deficits: Secondary | ICD-10-CM | POA: Insufficient documentation

## 2020-09-23 DIAGNOSIS — I5023 Acute on chronic systolic (congestive) heart failure: Secondary | ICD-10-CM | POA: Diagnosis not present

## 2020-09-23 DIAGNOSIS — Z8249 Family history of ischemic heart disease and other diseases of the circulatory system: Secondary | ICD-10-CM | POA: Diagnosis not present

## 2020-09-23 DIAGNOSIS — N183 Chronic kidney disease, stage 3 unspecified: Secondary | ICD-10-CM | POA: Diagnosis not present

## 2020-09-23 DIAGNOSIS — I130 Hypertensive heart and chronic kidney disease with heart failure and stage 1 through stage 4 chronic kidney disease, or unspecified chronic kidney disease: Secondary | ICD-10-CM | POA: Diagnosis not present

## 2020-09-23 DIAGNOSIS — E1122 Type 2 diabetes mellitus with diabetic chronic kidney disease: Secondary | ICD-10-CM | POA: Insufficient documentation

## 2020-09-23 DIAGNOSIS — E1151 Type 2 diabetes mellitus with diabetic peripheral angiopathy without gangrene: Secondary | ICD-10-CM | POA: Insufficient documentation

## 2020-09-23 DIAGNOSIS — N179 Acute kidney failure, unspecified: Secondary | ICD-10-CM | POA: Diagnosis not present

## 2020-09-23 DIAGNOSIS — I4510 Unspecified right bundle-branch block: Secondary | ICD-10-CM | POA: Diagnosis not present

## 2020-09-23 DIAGNOSIS — Z91013 Allergy to seafood: Secondary | ICD-10-CM | POA: Insufficient documentation

## 2020-09-23 DIAGNOSIS — I471 Supraventricular tachycardia: Secondary | ICD-10-CM | POA: Insufficient documentation

## 2020-09-23 DIAGNOSIS — Z7901 Long term (current) use of anticoagulants: Secondary | ICD-10-CM | POA: Diagnosis not present

## 2020-09-23 DIAGNOSIS — J90 Pleural effusion, not elsewhere classified: Secondary | ICD-10-CM | POA: Insufficient documentation

## 2020-09-23 DIAGNOSIS — Z79899 Other long term (current) drug therapy: Secondary | ICD-10-CM | POA: Insufficient documentation

## 2020-09-23 DIAGNOSIS — I420 Dilated cardiomyopathy: Secondary | ICD-10-CM | POA: Diagnosis not present

## 2020-09-23 LAB — BASIC METABOLIC PANEL
BUN/Creatinine Ratio: 19 (ref 10–24)
BUN: 42 mg/dL — ABNORMAL HIGH (ref 8–27)
CO2: 18 mmol/L — ABNORMAL LOW (ref 20–29)
Calcium: 9.6 mg/dL (ref 8.6–10.2)
Chloride: 103 mmol/L (ref 96–106)
Creatinine, Ser: 2.2 mg/dL — ABNORMAL HIGH (ref 0.76–1.27)
GFR calc Af Amer: 32 mL/min/{1.73_m2} — ABNORMAL LOW (ref >59)
GFR calc non Af Amer: 28 mL/min/{1.73_m2} — ABNORMAL LOW (ref >59)
Glucose: 106 mg/dL — ABNORMAL HIGH (ref 65–99)
Potassium: 5.1 mmol/L (ref 3.5–5.2)
Sodium: 138 mmol/L (ref 134–144)

## 2020-09-23 LAB — CBC
Hematocrit: 40.5 % (ref 37.5–51.0)
Hemoglobin: 13.4 g/dL (ref 13.0–17.7)
MCH: 22.9 pg — ABNORMAL LOW (ref 26.6–33.0)
MCHC: 33.1 g/dL (ref 31.5–35.7)
MCV: 69 fL — ABNORMAL LOW (ref 79–97)
Platelets: 372 10*3/uL (ref 150–450)
RBC: 5.86 x10E6/uL — ABNORMAL HIGH (ref 4.14–5.80)
RDW: 19.7 % — ABNORMAL HIGH (ref 11.6–15.4)
WBC: 6.8 10*3/uL (ref 3.4–10.8)

## 2020-09-23 NOTE — Progress Notes (Signed)
Advanced Heart Failure Clinic Note   Referring Physician: PCP: [name], MD PCP-Cardiologist: [name], MD  EP: Dr. [name] [facility]: Dr. Aundra Dubin  Reason for Visit: [facility] F/u for Systolic Heart   HPI:  Ryan [name] is a 75 y.o. 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   Hehas a history of endocarditis that resulted in mitral valve replacement in 2014. Heart cath prior to that surgery showed widely patent coronaries and moderate to severe pulmonary hypertension.  Recently dealing with volume overload. Failed outpatient tx. Seen by Dr. [name] in clinic 08/25/2020 and recommended directed admission via ER given bed issue &  BNP of > 06237. Also had intermittent chest pain. EKG revealed junctional tachycardia in the 120s. High-sensitivity troponin is minimally elevated at 25-->30.CXR showed small bilateral pleural effusions.   He initially had poor response to IV diuretics w/ rising SCr. AHF consultation was obtained. Had RHC which showed evidence for severe biventricular failure with PCWP and RA pressure >30 and PAPi 0.8. Equalization of diastolic pressures concerning for restrictive physiology. Cardiac output preserved (CI 2.28 Fick, 2.33 thermo). Was transferred to CCU for lasix gtt and later required addition of milrinone to aid in diuresis. Volume status improved. Also underwent SVT ablation ( felt slow AVNRT). Also had frequent PVCs treated w/ amiodarone. Of note LHC was not perused given abnormal renal function.   He was diuresed and transitioned to PO diuretics. SCr down to 1.74 day of d/c (down from peak of 2.4). He tolerated addition of  spiro and Jardiance ok. PT had recommended SNF but pt declined, opting for home health PT instead.   He presents to clinic today for f/u. Here w/ his daughter. Since discharge, he was diagnosed w/ UTI and PCP started abx. He also saw general cardiology yesterday and felt to be volume depleted. Wt down 6 lb from d/c wt and he reports poor PO intake. BP soft 106/80. Feels tired and weak, in wheel chair. Able to do basic ADLs (bathing and dressing) w/o exertional dyspnea. Family has been helping w/ cooking and house hold chores. In hindsight, he recalls having exertional CP this past summer w/ yard work but denies any recent CP.  At cardiology visit yesterday, he was advised to hold lasix and KCl. BMP showed elevated SCr at 2.24, BUN 42. K also elevated at 5.6. Optivol fluid analysis is also consistent w/ volume depletion. Index down, impedence up above reference curve.     Review of systems complete and found to be negative unless listed in HPI.      Past Medical History:  Diagnosis Date  . AICD (automatic cardioverter/defibrillator) present   . Atrial fibrillation ([place])   . 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 CHF (congestive heart failure) ([place])   . Diabetes mellitus without complication ([place])   . Endocarditis   . Hypertension   . Peripheral vascular disease ([place])   . PVC's (premature ventricular contractions)   . SVT (supraventricular tachycardia)  long RP     Current Outpatient Medications  Medication Sig Dispense Refill  . acetaminophen (TYLENOL) 325 MG tablet Take 650 mg by mouth every 6 (six) hours as needed for mild pain or headache.    Marland Kitchen amiodarone (  PACERONE) 200 MG tablet Take 1 tablet (200 mg total) by mouth daily. 30 tablet 6  . atorvastatin (LIPITOR) 40 MG tablet Take 1 tablet (40 mg total) by mouth daily at 6 PM. 30 tablet 5  . empagliflozin (JARDIANCE) 10 MG TABS tablet Take 1 tablet (10 mg total) by mouth daily. 30 tablet 6  . HYDROcodone-acetaminophen (NORCO/VICODIN) 5-325  MG per tablet Take 1 tablet by mouth 2 (two) times daily as needed for moderate pain.     . metoprolol succinate (TOPROL-XL) 25 MG 24 hr tablet Take 1 tablet (25 mg total) by mouth daily. 30 tablet 6  . Multiple Vitamins-Minerals (CENTRUM SILVER PO) Take 1 tablet by mouth 3 (three) times a week.    . pantoprazole (PROTONIX) 40 MG tablet Take 40 mg by mouth daily.     Marland Kitchen spironolactone (ALDACTONE) 25 MG tablet Take 0.5 tablets (12.5 mg total) by mouth daily. 30 tablet 6  . sulfamethoxazole-trimethoprim (BACTRIM DS) 800-160 MG tablet Take 1 tablet by mouth 2 (two) times daily. 10 tablet 0  . tamsulosin (FLOMAX) 0.4 MG CAPS capsule Take 0.4 mg by mouth daily.     Marland Kitchen triamcinolone cream (KENALOG) 0.5 % Apply 1 application topically daily as needed (skin rash).     . warfarin (COUMADIN) 2.5 MG tablet Take 2.5mg  ( 1 tablet ) Monday, wednesday and Friday  Other days take 5mg  (2 tablets) 44 tablet 1   No current facility-administered medications for this encounter.    Allergies  Allergen Reactions  . Geralyn Flash [Fish Allergy] Nausea And Vomiting      Social History   Socioeconomic History  . Marital status: Legally Separated    Spouse name: Not on file  . Number of children: 3  . Years of education: Not on file  . Highest education level: Not on file  Occupational History  . Not on file  Tobacco Use  . Smoking status: Never Smoker  . Smokeless tobacco: Never Used  Vaping Use  . Vaping Use: Never used  Substance and Sexual Activity  . Alcohol use: No    Alcohol/week: 0.0 standard drinks  . Drug use: No  . Sexual activity: Not on file  Other Topics Concern  . Not on file  Social History Narrative   Daughter lives with him.  Retired Advertising account planner   Social Determinants of Radio broadcast assistant Strain: Not on [employer] Insecurity: No [employer]  . Worried About Charity fundraiser in the Last Year: Never true  . Ran Out of Food in the Last Year: Never true  Transportation  Needs: No Transportation Needs  . Lack of Transportation (Medical): No  . Lack of Transportation (Non-Medical): No  Physical Activity: Not on file  Stress: Not on file  Social Connections: Not on file  Intimate Partner Violence: Not on file      Family History  Problem Relation Age of Onset  . Hypertension Mother   . Hypertension Father     Vitals:   09/23/20 1550  BP: 106/80  Pulse: 71  SpO2: 100%  Weight: 71.6 kg (157 lb 12.8 oz)     PHYSICAL EXAM: General: thin/ frail appearing AAM in wheel chair. No respiratory difficulty HEENT: normal Neck: supple. no JVD. Carotids 2+ bilat; no bruits. No lymphadenopathy or thyromegaly appreciated. Cor: PMI nondisplaced. Regular rate & rhythm. No rubs, gallops or murmurs. Lungs: clear Abdomen: soft, nontender, nondistended. No hepatosplenomegaly. No bruits or masses. Good bowel sounds. Extremities: no cyanosis,  clubbing, rash, edema Neuro: alert & oriented x 3, cranial nerves grossly intact. moves all 4 extremities w/o difficulty. Affect pleasant.  ECG: Sinus arrhthymias, 70 bpm, marked 1st degree AVB   ASSESSMENT & PLAN:  1. Chronic Systolic CHF: Biventricular failure. Has MDT ICD. Echo 12/20: EF 20-25%, moderate RV enlargement with severely decreased RV systolic function, normally functioning bioprosthetic mitral valve with no significant MR and mean gradient 6, moderate TR, dilated IVC. Patient had normal EF in 12/13 pre-mitral valve replacement. 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 His last cath was in 1/14 pre-surgery, no significant CAD. On [place] 12/10, evidence for severe biventricular failure with PCWP and RA pressure >30 and PAPi 0.8. Equalization of diastolic pressures concerning for restrictive physiology. Cardiac output preserved (CI 2.28 Fick, 2.33 thermo). It is possible that cardiomyopathy is at least in part  tachycardia-mediated given frequent runs of SVT (though rate is relatively slow, 110s for the most part). Also has history of frequent PVCs. Cannot rule out coronary disease though no coronary angiography yet with elevated creatinine.  - He is dry on exam. Optivol also c/w volume depletion.  - Agree w/ general cardiology. Stop Lasix and KCl. BMP yesterday suggestive of AKI/dehydration - will continue Jardiance for now - Hold spiro w/ hyperkalemia  - No ARNi/ARB w/ low BP and AKI - No Bidil for now with soft BP, consider in future if BP stabilizes.  - Continue Toprol XL 25 mg daily.  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  - S/P ablation 09/03/20.  - Maintaining NSR on EKG today  -  We need to keep in NSR if at all possible given concern for tachy-mediated CMP.  3. PVCs: History of very frequent PVCs  This may play a role in biventricular failure.  - Continue PO amio 200 mg daily.    - Will need TFTs and HFTs in 6 weeks  4. PAD: h/o right popliteal occlusion. Continue atorvastatin.  5. Atrial fibrillation: Paroxysmal.  Has history of CVA.  - NSR on EKG today  - On warfarin. INRs followed in coumadin clinic. 6. 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  7. AKI on CKD stage 3:  - SCr up on labs yesterday from 1.7 day of d/c to 2.2, suspect from over-diuresis/ volume depletion  - stop lasix and spiro as outlined above - f/u BMP and remote device interrogation in 7 days  - if renal function dose not improve w/ diuretic hold will need to reassess for home inotrope needs  8. Deconditioning - hospital PT initially recommended SNF for rehab but pt refused, opting for home health PT instead.  This has not yet been arranged. - will place referral for HHPT  F/u in 2 weeks    Lyda Jester, PA-C 09/23/20

## 2020-09-23 NOTE — Telephone Encounter (Signed)
[phone] (M)  LMOM-advised to stop spiro and return call for details and labs results     Abnormal labs  Per Lyda Jester PA K 5.6 Renal function worse Stop spiro as well

## 2020-09-23 NOTE — Patient Instructions (Signed)
STOP Lasix STOP Potassium   Labs needed in one week  You have been referred to [facility] CHF management and in home physical therapy  Your physician recommends that you schedule a follow-up appointment in: 2-3 weeks  If you have any questions or concerns before your next appointment please send [place] a message through [place] or call our office at [phone].    TO LEAVE A MESSAGE FOR THE NURSE SELECT OPTION 2, PLEASE LEAVE A MESSAGE INCLUDING: . YOUR NAME . DATE OF BIRTH . CALL BACK NUMBER . REASON FOR CALL**this is important as we prioritize the call backs  YOU WILL RECEIVE A CALL BACK THE SAME DAY AS LONG AS YOU CALL BEFORE 4:00 PM   Do the following things EVERYDAY: 1) Weigh yourself in the morning before breakfast. Write it down and keep it in a log. 2) Take your medicines as prescribed 3) Eat low salt foods-Limit salt (sodium) to 2000 mg per day.  4) Stay as active as you can everyday 5) Limit all fluids for the day to less than 2 liters

## 2020-09-24 NOTE — Telephone Encounter (Signed)
Pt aware.

## 2020-09-26 DIAGNOSIS — N1832 Chronic kidney disease, stage 3b: Secondary | ICD-10-CM | POA: Diagnosis not present

## 2020-09-26 DIAGNOSIS — I509 Heart failure, unspecified: Secondary | ICD-10-CM | POA: Diagnosis not present

## 2020-09-26 DIAGNOSIS — I429 Cardiomyopathy, unspecified: Secondary | ICD-10-CM | POA: Diagnosis not present

## 2020-09-26 DIAGNOSIS — N1831 Chronic kidney disease, stage 3a: Secondary | ICD-10-CM | POA: Diagnosis not present

## 2020-09-26 DIAGNOSIS — N529 Male erectile dysfunction, unspecified: Secondary | ICD-10-CM | POA: Diagnosis not present

## 2020-09-26 DIAGNOSIS — I482 Chronic atrial fibrillation, unspecified: Secondary | ICD-10-CM | POA: Diagnosis not present

## 2020-09-26 DIAGNOSIS — Z7901 Long term (current) use of anticoagulants: Secondary | ICD-10-CM | POA: Diagnosis not present

## 2020-09-26 DIAGNOSIS — J309 Allergic rhinitis, unspecified: Secondary | ICD-10-CM | POA: Diagnosis not present

## 2020-09-26 DIAGNOSIS — E118 Type 2 diabetes mellitus with unspecified complications: Secondary | ICD-10-CM | POA: Diagnosis not present

## 2020-09-26 NOTE — Progress Notes (Signed)
Remote ICD transmission.   

## 2020-09-30 ENCOUNTER — Telehealth: Payer: Self-pay | Admitting: Cardiology

## 2020-09-30 ENCOUNTER — Telehealth: Payer: Self-pay

## 2020-09-30 DIAGNOSIS — I5023 Acute on chronic systolic (congestive) heart failure: Secondary | ICD-10-CM | POA: Diagnosis not present

## 2020-09-30 DIAGNOSIS — I130 Hypertensive heart and chronic kidney disease with heart failure and stage 1 through stage 4 chronic kidney disease, or unspecified chronic kidney disease: Secondary | ICD-10-CM | POA: Diagnosis not present

## 2020-09-30 DIAGNOSIS — I272 Pulmonary hypertension, unspecified: Secondary | ICD-10-CM | POA: Diagnosis not present

## 2020-09-30 DIAGNOSIS — I471 Supraventricular tachycardia: Secondary | ICD-10-CM | POA: Diagnosis not present

## 2020-09-30 DIAGNOSIS — E1122 Type 2 diabetes mellitus with diabetic chronic kidney disease: Secondary | ICD-10-CM | POA: Diagnosis not present

## 2020-09-30 DIAGNOSIS — E1151 Type 2 diabetes mellitus with diabetic peripheral angiopathy without gangrene: Secondary | ICD-10-CM | POA: Diagnosis not present

## 2020-09-30 DIAGNOSIS — I420 Dilated cardiomyopathy: Secondary | ICD-10-CM | POA: Diagnosis not present

## 2020-09-30 DIAGNOSIS — N183 Chronic kidney disease, stage 3 unspecified: Secondary | ICD-10-CM | POA: Diagnosis not present

## 2020-09-30 DIAGNOSIS — I4891 Unspecified atrial fibrillation: Secondary | ICD-10-CM | POA: Diagnosis not present

## 2020-09-30 NOTE — Telephone Encounter (Signed)
Referred to ICM clinic by Ellen Henri, PA in HF clinic. Spoke with patient and explained reason for call.  Provided ICM intro and agreeable to monthly follow up.  Advised [place] asked to have remote transmission recheck 7 days after the [place] since Lasix has been stopped.  Assisted in sending remote transmission.    He is feeling much better since OV and reports weight is stable at 154.6 lbs.  Reviewed fluid accumulation symptoms and advised if he experiences any symptoms to call the office.  He denies any dizziness or lightheadedness at this time.  Provided ICM direct number. He is scheduled for labs on 10/01/20.    Will recheck fluid levels 10/07/2020.  Copy sent to Ellen Henri, [place] for review and will call patient back if there are any recommendations.     09/30/2020 Optivol suggesting dryness since 09/15/2020 but starting to trend toward baseline.

## 2020-09-30 NOTE — Telephone Encounter (Signed)
Ryan [name] from [place] at [facility] called. She needs approval of frequency for a patient that was just referred to their service. Ryan [name] did the initial evaluation. She is requesting 2x weekly PT for 6 weeks then 1x weekly for 3 weeks.  Please call back to confirm  request

## 2020-09-30 NOTE — Telephone Encounter (Signed)
Left VM to give verbal order to [facility].

## 2020-09-30 NOTE — Telephone Encounter (Signed)
Will send to Heart and Vascular Team. Chase Picket, PA ordered Home Health.

## 2020-10-01 ENCOUNTER — Other Ambulatory Visit: Payer: Self-pay | Admitting: Internal Medicine

## 2020-10-01 DIAGNOSIS — I272 Pulmonary hypertension, unspecified: Secondary | ICD-10-CM | POA: Diagnosis not present

## 2020-10-01 DIAGNOSIS — I420 Dilated cardiomyopathy: Secondary | ICD-10-CM | POA: Diagnosis not present

## 2020-10-01 DIAGNOSIS — N183 Chronic kidney disease, stage 3 unspecified: Secondary | ICD-10-CM | POA: Diagnosis not present

## 2020-10-01 DIAGNOSIS — E1122 Type 2 diabetes mellitus with diabetic chronic kidney disease: Secondary | ICD-10-CM | POA: Diagnosis not present

## 2020-10-01 DIAGNOSIS — I509 Heart failure, unspecified: Secondary | ICD-10-CM | POA: Diagnosis not present

## 2020-10-01 DIAGNOSIS — I4891 Unspecified atrial fibrillation: Secondary | ICD-10-CM | POA: Diagnosis not present

## 2020-10-01 DIAGNOSIS — I130 Hypertensive heart and chronic kidney disease with heart failure and stage 1 through stage 4 chronic kidney disease, or unspecified chronic kidney disease: Secondary | ICD-10-CM | POA: Diagnosis not present

## 2020-10-01 DIAGNOSIS — I5023 Acute on chronic systolic (congestive) heart failure: Secondary | ICD-10-CM | POA: Diagnosis not present

## 2020-10-01 DIAGNOSIS — I471 Supraventricular tachycardia: Secondary | ICD-10-CM | POA: Diagnosis not present

## 2020-10-01 DIAGNOSIS — E1151 Type 2 diabetes mellitus with diabetic peripheral angiopathy without gangrene: Secondary | ICD-10-CM | POA: Diagnosis not present

## 2020-10-02 LAB — BASIC METABOLIC PANEL
BUN/Creatinine Ratio: 20 (ref 10–24)
BUN: 37 mg/dL — ABNORMAL HIGH (ref 8–27)
CO2: 22 mmol/L (ref 20–29)
Calcium: 9.3 mg/dL (ref 8.6–10.2)
Chloride: 106 mmol/L (ref 96–106)
Creatinine, Ser: 1.88 mg/dL — ABNORMAL HIGH (ref 0.76–1.27)
GFR calc Af Amer: 39 mL/min/{1.73_m2} — ABNORMAL LOW (ref >59)
GFR calc non Af Amer: 34 mL/min/{1.73_m2} — ABNORMAL LOW (ref >59)
Glucose: 98 mg/dL (ref 65–99)
Potassium: 4.6 mmol/L (ref 3.5–5.2)
Sodium: 140 mmol/L (ref 134–144)

## 2020-10-06 ENCOUNTER — Ambulatory Visit: Payer: Medicare HMO | Admitting: Nurse Practitioner

## 2020-10-07 ENCOUNTER — Ambulatory Visit (INDEPENDENT_AMBULATORY_CARE_PROVIDER_SITE_OTHER): Payer: Medicare HMO

## 2020-10-07 DIAGNOSIS — Z95810 Presence of automatic (implantable) cardiac defibrillator: Secondary | ICD-10-CM | POA: Diagnosis not present

## 2020-10-07 DIAGNOSIS — I5022 Chronic systolic (congestive) heart failure: Secondary | ICD-10-CM | POA: Diagnosis not present

## 2020-10-07 NOTE — Progress Notes (Signed)
EPIC Encounter for ICM Monitoring  Patient Name: Ryan [name] is a 75 y.o. male Date: 10/07/2020 Primary Care Physican: [name], MD Primary Cardiologist: [place] Electrophysiologist: [name] 10/07/2020 Weight: 154.6 lbs        1st ICM remote transmission. Heart Failure questions reviewed.  Pt asymptomatic for dryness or fluid symptoms.  Lasix and KCL were stopped at hospital discharge in December 2021 due to he was dehydrated.   Optivol thoracic impedance suggesting fluid levels have returned to normal after showing dryness after hospitalization.   Prescribed: Not taking diuretic since December 2021  Recommendations:   Encouraged to call if experiencing change in condition.  Follow-up plan: ICM clinic phone appointment on 11/10/2020.   91 day device clinic remote transmission 12/15/2020.    EP/Cardiology Office Visits: 10/14/2020 with HF clinic PA/NP.  10/23/2020 with Dr [name]    Copy of ICM check sent to Dr. [name] and Ellen Henri PA at HF clinic.   3 month ICM trend: 10/07/2020.    1 Year ICM trend:       Rosalene Billings, RN 10/07/2020 12:37 PM

## 2020-10-09 ENCOUNTER — Other Ambulatory Visit (HOSPITAL_COMMUNITY): Payer: Self-pay | Admitting: Internal Medicine

## 2020-10-11 DIAGNOSIS — I130 Hypertensive heart and chronic kidney disease with heart failure and stage 1 through stage 4 chronic kidney disease, or unspecified chronic kidney disease: Secondary | ICD-10-CM | POA: Diagnosis not present

## 2020-10-11 DIAGNOSIS — I471 Supraventricular tachycardia: Secondary | ICD-10-CM | POA: Diagnosis not present

## 2020-10-11 DIAGNOSIS — I5023 Acute on chronic systolic (congestive) heart failure: Secondary | ICD-10-CM | POA: Diagnosis not present

## 2020-10-11 DIAGNOSIS — E1122 Type 2 diabetes mellitus with diabetic chronic kidney disease: Secondary | ICD-10-CM | POA: Diagnosis not present

## 2020-10-11 DIAGNOSIS — I4891 Unspecified atrial fibrillation: Secondary | ICD-10-CM | POA: Diagnosis not present

## 2020-10-11 DIAGNOSIS — E1151 Type 2 diabetes mellitus with diabetic peripheral angiopathy without gangrene: Secondary | ICD-10-CM | POA: Diagnosis not present

## 2020-10-11 DIAGNOSIS — I272 Pulmonary hypertension, unspecified: Secondary | ICD-10-CM | POA: Diagnosis not present

## 2020-10-11 DIAGNOSIS — N183 Chronic kidney disease, stage 3 unspecified: Secondary | ICD-10-CM | POA: Diagnosis not present

## 2020-10-11 DIAGNOSIS — I420 Dilated cardiomyopathy: Secondary | ICD-10-CM | POA: Diagnosis not present

## 2020-10-13 DIAGNOSIS — I4891 Unspecified atrial fibrillation: Secondary | ICD-10-CM | POA: Diagnosis not present

## 2020-10-13 DIAGNOSIS — I471 Supraventricular tachycardia: Secondary | ICD-10-CM | POA: Diagnosis not present

## 2020-10-13 DIAGNOSIS — E1122 Type 2 diabetes mellitus with diabetic chronic kidney disease: Secondary | ICD-10-CM | POA: Diagnosis not present

## 2020-10-13 DIAGNOSIS — I5023 Acute on chronic systolic (congestive) heart failure: Secondary | ICD-10-CM | POA: Diagnosis not present

## 2020-10-13 DIAGNOSIS — E1151 Type 2 diabetes mellitus with diabetic peripheral angiopathy without gangrene: Secondary | ICD-10-CM | POA: Diagnosis not present

## 2020-10-13 DIAGNOSIS — I130 Hypertensive heart and chronic kidney disease with heart failure and stage 1 through stage 4 chronic kidney disease, or unspecified chronic kidney disease: Secondary | ICD-10-CM | POA: Diagnosis not present

## 2020-10-13 DIAGNOSIS — I272 Pulmonary hypertension, unspecified: Secondary | ICD-10-CM | POA: Diagnosis not present

## 2020-10-13 DIAGNOSIS — I420 Dilated cardiomyopathy: Secondary | ICD-10-CM | POA: Diagnosis not present

## 2020-10-13 DIAGNOSIS — N183 Chronic kidney disease, stage 3 unspecified: Secondary | ICD-10-CM | POA: Diagnosis not present

## 2020-10-13 NOTE — Progress Notes (Addendum)
Advanced Heart Failure Clinic Note   Referring Physician: PCP: [name], MD PCP-Cardiologist: [name], MD  EP: Dr. [name] HF Cardiologist: Dr. Aundra Dubin  Reason for Visit: F/u for Systolic Heart Failure  HPI: Ryan [name] is a 75 y.o. 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   Hehas a history of endocarditis that resulted in mitral valve replacement in 2014. Heart cath prior to that surgery showed widely patent coronaries and moderate to severe pulmonary hypertension.  Recently dealing with volume overload. Failed outpatient tx. Seen by Dr. [name] in clinic 08/25/2020 and recommended directed admission via ER given bed issue &  BNP of > 57846. Also had intermittent chest pain. EKG revealed junctional tachycardia in the 120s. High-sensitivity troponin is minimally elevated at 25-->30.CXR showed small bilateral pleural effusions.   He initially had poor response to IV diuretics w/ rising SCr. AHF consultation was obtained. Had RHC which showed evidence for severe biventricular failure with PCWP and RA pressure >30 and PAPi 0.8. Equalization of diastolic pressures concerning for restrictive physiology. Cardiac output preserved (CI 2.28 Fick, 2.33 thermo). Was transferred to CCU for lasix gtt and later required addition of milrinone to aid in diuresis. Volume status improved. Also underwent SVT ablation ( felt slow AVNRT). Also had frequent PVCs treated w/ amiodarone. Of note LHC was not perused given abnormal renal function.   He was diuresed and transitioned to PO diuretics. SCr down to 1.74 day of d/c (down from peak of 2.4). He tolerated addition of  spiro and Jardiance ok. PT had recommended SNF but pt declined, opting for home health PT instead.   He presented 1/22 to clinic for f/u with his daughter. He also saw general cardiology yesterday and felt to be volume depleted. Wt down 6 lb from d/c wt and he reports poor PO intake. BP soft 106/80. Feels tired and weak, in wheel chair. Able to do basic ADLs (bathing and dressing) w/o exertional dyspnea. Family has been helping w/ cooking and house hold chores. In hindsight, he recalls having exertional CP this past summer w/ yard work but denies any recent CP.  At cardiology visit yesterday, he was advised to hold lasix and KCl. BMP showed elevated SCr at 2.24, BUN 42. K also elevated at 5.6. Optivol fluid analysis is also consistent w/ volume depletion. Index down, impedence up above reference curve. Lasix stopped and spiro held this visit.  Today he returns for HF follow up. Overall feeling fine. Able to get around the house and do ADLs. Participating in [facility]. Denies increasing SOB, CP, palpitations. dizziness, edema, or PND/Orthopnea. Appetite ok. No fever or chills. Weight at home ~153 pounds. Taking all medications.    Review of systems complete and found to be negative unless listed in HPI.     Past Medical History:  Diagnosis Date  . AICD (automatic cardioverter/defibrillator) present   . Atrial fibrillation ([place])   . 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 CHF (congestive heart failure) ([place])   . Diabetes mellitus without complication ([place])   . Endocarditis   . Hypertension   . Peripheral vascular disease ([place])   . PVC's (premature ventricular contractions)   . SVT (supraventricular tachycardia)  long RP  Current Outpatient Medications  Medication Sig Dispense Refill  . acetaminophen (TYLENOL) 325 MG tablet Take 650 mg by mouth every 6 (six) hours as needed for mild pain or headache.    Marland Kitchen amiodarone (PACERONE) 200 MG tablet Take 1 tablet (200 mg total) by mouth daily. 30 tablet 6   . atorvastatin (LIPITOR) 40 MG tablet Take 1 tablet (40 mg total) by mouth daily at 6 PM. 30 tablet 5  . empagliflozin (JARDIANCE) 10 MG TABS tablet Take 1 tablet (10 mg total) by mouth daily. 30 tablet 6  . HYDROcodone-acetaminophen (NORCO/VICODIN) 5-325 MG per tablet Take 1 tablet by mouth 2 (two) times daily as needed for moderate pain.     . metoprolol succinate (TOPROL-XL) 25 MG 24 hr tablet Take 1 tablet (25 mg total) by mouth daily. 30 tablet 6  . pantoprazole (PROTONIX) 40 MG tablet Take 40 mg by mouth daily.     Marland Kitchen sulfamethoxazole-trimethoprim (BACTRIM DS) 800-160 MG tablet Take 1 tablet by mouth 2 (two) times daily. 10 tablet 0  . tamsulosin (FLOMAX) 0.4 MG CAPS capsule Take 0.4 mg by mouth daily.     Marland Kitchen triamcinolone cream (KENALOG) 0.5 % Apply 1 application topically daily as needed (skin rash).     . warfarin (COUMADIN) 2.5 MG tablet Take 2.'5mg'$  ( 1 tablet ) Monday, wednesday and Friday  Other days take '5mg'$  (2 tablets) 44 tablet 1  . Multiple Vitamins-Minerals (CENTRUM SILVER PO) Take 1 tablet by mouth 3 (three) times a week. (Patient not taking: Reported on 10/14/2020)     No current facility-administered medications for this encounter.    Allergies  Allergen Reactions  . Geralyn Flash [Fish Allergy] Nausea And Vomiting      Social History   Socioeconomic History  . Marital status: Legally Separated    Spouse name: Not on file  . Number of children: 3  . Years of education: Not on file  . Highest education level: Not on file  Occupational History  . Not on file  Tobacco Use  . Smoking status: Never Smoker  . Smokeless tobacco: Never Used  Vaping Use  . Vaping Use: Never used  Substance and Sexual Activity  . Alcohol use: No    Alcohol/week: 0.0 standard drinks  . Drug use: No  . Sexual activity: Not on file  Other Topics Concern  . Not on file  Social History Narrative   Daughter lives with him.  Retired Advertising account planner   Social Determinants of Systems developer Strain: Not on [employer] Insecurity: No [employer]  . Worried About Charity fundraiser in the Last Year: Never true  . Ran Out of Food in the Last Year: Never true  Transportation Needs: No Transportation Needs  . Lack of Transportation (Medical): No  . Lack of Transportation (Non-Medical): No  Physical Activity: Not on file  Stress: Not on file  Social Connections: Not on file  Intimate Partner Violence: Not on file     Family History  Problem Relation Age of Onset  . Hypertension Mother   . Hypertension Father     Vitals:   10/14/20 1334  BP: 121/79  Pulse: 81  SpO2: 97%  Weight: 72.7 kg (160 lb 3.2 oz)   Wt Readings from Last 3 Encounters:  10/14/20 72.7 kg (160 lb 3.2 oz)  09/23/20 71.6 kg (157 lb 12.8 oz)  09/22/20 70 kg (154 lb 6.4 oz)     PHYSICAL EXAM: General:  Thin/frail,  using rolling walker. NAD. No resp difficulty HEENT: Normal Neck: Supple. No JVD. Carotids 2+ bilat; no bruits. No lymphadenopathy or thryomegaly appreciated. Cor: PMI nondisplaced. Regular rate & rhythm. No rubs, gallops or murmurs. Lungs: Clear Abdomen: Soft, nontender, nondistended. No hepatosplenomegaly. No bruits or masses. Good bowel sounds. Extremities: No cyanosis, clubbing, rash, edema Neuro: alert & oriented x 3, cranial nerves grossly intact. Moves all 4 extremities w/o difficulty. Affect pleasant.  Optivol: Fluid trending back up towards baseline, thoracic impedence just below baseline, ~0.5 hrs activity/day, no VT/VF (personally reviewed).  ASSESSMENT & PLAN: 1. Chronic Systolic CHF: Biventricular failure. Has MDT ICD. Echo 12/20: EF 20-25%, moderate RV enlargement with severely decreased RV systolic function, normally functioning bioprosthetic mitral valve with no significant MR and mean gradient 6, moderate TR, dilated IVC. Patient had normal EF in 12/13 pre-mitral valve replacement. ICD was placed and EF noted to be down to 30-35% in 2017, cardiomyopathy  thought to be due to frequent PVCs (20% by monitoring). His last cath was in 1/14 pre-surgery, no significant CAD. On [place] 12/10, evidence for severe biventricular failure with PCWP and RA pressure >30 and PAPi 0.8. Equalization of diastolic pressures concerning for restrictive physiology. Cardiac output preserved (CI 2.28 Fick, 2.33 thermo). It is possible that cardiomyopathy is at least in part tachycardia-mediated given frequent runs of SVT (though rate is relatively slow, 110s for the most part). Also has history of frequent PVCs. Cannot rule out coronary disease though no coronary angiography yet with elevated creatinine.  - NYHA III, functional status compounded by physical deconditioning. Volume better, still on dry side per OptiVol. - He does not need scheduled diuretics, lasix prn for weight of 158 lbs on home scale. - QRS too narrow to reprogram CRT-D. - Continue to hold spiro w/ elevated SCr.  - Continue Jardiance 10 mg daily. - Consider starting low-dose losartan q hs pending BMET today. - No Bidil for now, consider in future if BP stabilizes.  - Continue Toprol XL 25 mg daily. - He is not an Anthem candidate due to his current need for walker. - BMET today. Schedule CPX to better quantify functional status from HF perspective. 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  - S/P ablation 09/03/20.  - Maintaining NSR, regular on exam today. - We need to keep in NSR if at all possible given concern for tachy-mediated CMP.   3. PVCs: History of very frequent PVCs. This may play a role in biventricular failure.  - Continue PO amio 200 mg daily.    - Will need TFTs and HFTs in 6 weeks (mid 2/22)  4. PAD: h/o right popliteal occlusion. Continue atorvastatin.   5. Atrial fibrillation: Paroxysmal.  Has history of CVA.  - Regular on exam today. - On warfarin. INRs followed  by PCP in [place].  6. 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   7. CKD stage 3:  - Most recent SCr1.88, down from 2.2 - Baseline creatinine ~1.7-1.8. - Continue to hold spiro as outlined above.  - If renal function dose not improve w/ diuretic hold, will need to reassess for home inotrope needs   8. [facility] PT initially recommended SNF for rehab but pt refused, opting for home health PT instead.  - Continue w/ HHPT  F/u in 3 weeks in APP clinic, schedule CPX, and follow back in Dr.  McLean in 2 months.   [place], FNP 10/14/20

## 2020-10-14 ENCOUNTER — Ambulatory Visit (HOSPITAL_COMMUNITY)
Admission: RE | Admit: 2020-10-14 | Discharge: 2020-10-14 | Disposition: A | Discharge status: Home / Self Care | Payer: Medicare HMO | Source: Ambulatory Visit | Attending: Family Medicine | Admitting: Family Medicine

## 2020-10-14 ENCOUNTER — Other Ambulatory Visit: Payer: Self-pay

## 2020-10-14 ENCOUNTER — Encounter (HOSPITAL_COMMUNITY): Payer: Self-pay

## 2020-10-14 VITALS — BP 121/79 | HR 81 | Wt 160.2 lb

## 2020-10-14 DIAGNOSIS — I420 Dilated cardiomyopathy: Secondary | ICD-10-CM | POA: Diagnosis not present

## 2020-10-14 DIAGNOSIS — I480 Paroxysmal atrial fibrillation: Secondary | ICD-10-CM

## 2020-10-14 DIAGNOSIS — I779 Disorder of arteries and arterioles, unspecified: Secondary | ICD-10-CM

## 2020-10-14 DIAGNOSIS — R5381 Other malaise: Secondary | ICD-10-CM

## 2020-10-14 DIAGNOSIS — Z7901 Long term (current) use of anticoagulants: Secondary | ICD-10-CM | POA: Diagnosis not present

## 2020-10-14 DIAGNOSIS — N1832 Chronic kidney disease, stage 3b: Secondary | ICD-10-CM | POA: Diagnosis not present

## 2020-10-14 DIAGNOSIS — Z79899 Other long term (current) drug therapy: Secondary | ICD-10-CM | POA: Insufficient documentation

## 2020-10-14 DIAGNOSIS — Z95810 Presence of automatic (implantable) cardiac defibrillator: Secondary | ICD-10-CM | POA: Insufficient documentation

## 2020-10-14 DIAGNOSIS — I471 Supraventricular tachycardia, unspecified: Secondary | ICD-10-CM

## 2020-10-14 DIAGNOSIS — E1122 Type 2 diabetes mellitus with diabetic chronic kidney disease: Secondary | ICD-10-CM | POA: Diagnosis not present

## 2020-10-14 DIAGNOSIS — Z8673 Personal history of transient ischemic attack (TIA), and cerebral infarction without residual deficits: Secondary | ICD-10-CM | POA: Diagnosis not present

## 2020-10-14 DIAGNOSIS — I5082 Biventricular heart failure: Secondary | ICD-10-CM | POA: Insufficient documentation

## 2020-10-14 DIAGNOSIS — I5022 Chronic systolic (congestive) heart failure: Secondary | ICD-10-CM

## 2020-10-14 DIAGNOSIS — I493 Ventricular premature depolarization: Secondary | ICD-10-CM | POA: Diagnosis not present

## 2020-10-14 DIAGNOSIS — N183 Chronic kidney disease, stage 3 unspecified: Secondary | ICD-10-CM | POA: Insufficient documentation

## 2020-10-14 DIAGNOSIS — E1151 Type 2 diabetes mellitus with diabetic peripheral angiopathy without gangrene: Secondary | ICD-10-CM | POA: Insufficient documentation

## 2020-10-14 DIAGNOSIS — Z7984 Long term (current) use of oral hypoglycemic drugs: Secondary | ICD-10-CM | POA: Diagnosis not present

## 2020-10-14 DIAGNOSIS — I5023 Acute on chronic systolic (congestive) heart failure: Secondary | ICD-10-CM

## 2020-10-14 DIAGNOSIS — I130 Hypertensive heart and chronic kidney disease with heart failure and stage 1 through stage 4 chronic kidney disease, or unspecified chronic kidney disease: Secondary | ICD-10-CM | POA: Diagnosis not present

## 2020-10-14 DIAGNOSIS — Z952 Presence of prosthetic heart valve: Secondary | ICD-10-CM | POA: Insufficient documentation

## 2020-10-14 DIAGNOSIS — Z953 Presence of xenogenic heart valve: Secondary | ICD-10-CM | POA: Diagnosis not present

## 2020-10-14 DIAGNOSIS — Z8249 Family history of ischemic heart disease and other diseases of the circulatory system: Secondary | ICD-10-CM | POA: Diagnosis not present

## 2020-10-14 LAB — BASIC METABOLIC PANEL
Anion gap: 7 (ref 5–15)
BUN: 22 mg/dL (ref 8–23)
CO2: 23 mmol/L (ref 22–32)
Calcium: 8.9 mg/dL (ref 8.9–10.3)
Chloride: 109 mmol/L (ref 98–111)
Creatinine, Ser: 1.5 mg/dL — ABNORMAL HIGH (ref 0.61–1.24)
GFR, Estimated: 48 mL/min — ABNORMAL LOW (ref >60)
Glucose, Bld: 103 mg/dL — ABNORMAL HIGH (ref 70–99)
Potassium: 4 mmol/L (ref 3.5–5.1)
Sodium: 139 mmol/L (ref 135–145)

## 2020-10-14 NOTE — Addendum Note (Signed)
Encounter addended by: Rafael Bihari, FNP on: 10/14/2020 3:28 PM  Actions taken: Clinical Note Signed

## 2020-10-14 NOTE — Patient Instructions (Signed)
It was great to see you today! No medication changes are needed at this time.  Labs today We will only contact you if something comes back abnormal or we need to make some changes. Otherwise no news is good news!  Your physician recommends that you schedule a follow-up appointment in: 3 weeks  in the Advanced Practitioners (PA/NP) Clinic    If you have any questions or concerns before your next appointment please send [place] a message through [place] or call our office at [phone].    TO LEAVE A MESSAGE FOR THE NURSE SELECT OPTION 2, PLEASE LEAVE A MESSAGE INCLUDING: . YOUR NAME . DATE OF BIRTH . CALL BACK NUMBER . REASON FOR CALL**this is important as we prioritize the call backs  YOU WILL RECEIVE A CALL BACK THE SAME DAY AS LONG AS YOU CALL BEFORE 4:00 PM  Do the following things EVERYDAY: 1) Weigh yourself in the morning before breakfast. Write it down and keep it in a log. 2) Take your medicines as prescribed 3) Eat low salt foods-Limit salt (sodium) to 2000 mg per day.  4) Stay as active as you can everyday 5) Limit all fluids for the day to less than 2 liters  At the [facility], you and your health needs are our priority. As part of our continuing mission to provide you with exceptional heart care, we have created designated Provider Care Teams. These Care Teams include your primary Cardiologist (physician) and Advanced Practice Providers (APPs- Physician Assistants and Nurse Practitioners) who all work together to provide you with the care you need, when you need it.   You may see any of the following providers on your designated Care Team at your next follow up: Marland Kitchen Dr Glori Bickers . Dr Loralie Champagne . Darrick Grinder, NP . Lyda Jester, [place] . Audry Riles, PharmD   Please be sure to bring in all your medications bottles to every appointment.

## 2020-10-15 DIAGNOSIS — I272 Pulmonary hypertension, unspecified: Secondary | ICD-10-CM | POA: Diagnosis not present

## 2020-10-15 DIAGNOSIS — E1140 Type 2 diabetes mellitus with diabetic neuropathy, unspecified: Secondary | ICD-10-CM | POA: Diagnosis not present

## 2020-10-15 DIAGNOSIS — E1159 Type 2 diabetes mellitus with other circulatory complications: Secondary | ICD-10-CM | POA: Diagnosis not present

## 2020-10-15 DIAGNOSIS — N529 Male erectile dysfunction, unspecified: Secondary | ICD-10-CM | POA: Diagnosis not present

## 2020-10-15 DIAGNOSIS — I482 Chronic atrial fibrillation, unspecified: Secondary | ICD-10-CM | POA: Diagnosis not present

## 2020-10-15 DIAGNOSIS — Z7901 Long term (current) use of anticoagulants: Secondary | ICD-10-CM | POA: Diagnosis not present

## 2020-10-15 DIAGNOSIS — E1122 Type 2 diabetes mellitus with diabetic chronic kidney disease: Secondary | ICD-10-CM | POA: Diagnosis not present

## 2020-10-15 DIAGNOSIS — I130 Hypertensive heart and chronic kidney disease with heart failure and stage 1 through stage 4 chronic kidney disease, or unspecified chronic kidney disease: Secondary | ICD-10-CM | POA: Diagnosis not present

## 2020-10-15 DIAGNOSIS — N1832 Chronic kidney disease, stage 3b: Secondary | ICD-10-CM | POA: Diagnosis not present

## 2020-10-15 DIAGNOSIS — I429 Cardiomyopathy, unspecified: Secondary | ICD-10-CM | POA: Diagnosis not present

## 2020-10-15 DIAGNOSIS — N183 Chronic kidney disease, stage 3 unspecified: Secondary | ICD-10-CM | POA: Diagnosis not present

## 2020-10-15 DIAGNOSIS — I4891 Unspecified atrial fibrillation: Secondary | ICD-10-CM | POA: Diagnosis not present

## 2020-10-15 DIAGNOSIS — I5023 Acute on chronic systolic (congestive) heart failure: Secondary | ICD-10-CM | POA: Diagnosis not present

## 2020-10-15 DIAGNOSIS — J309 Allergic rhinitis, unspecified: Secondary | ICD-10-CM | POA: Diagnosis not present

## 2020-10-15 DIAGNOSIS — E1151 Type 2 diabetes mellitus with diabetic peripheral angiopathy without gangrene: Secondary | ICD-10-CM | POA: Diagnosis not present

## 2020-10-15 DIAGNOSIS — I420 Dilated cardiomyopathy: Secondary | ICD-10-CM | POA: Diagnosis not present

## 2020-10-15 DIAGNOSIS — I509 Heart failure, unspecified: Secondary | ICD-10-CM | POA: Diagnosis not present

## 2020-10-15 DIAGNOSIS — I471 Supraventricular tachycardia: Secondary | ICD-10-CM | POA: Diagnosis not present

## 2020-10-16 ENCOUNTER — Encounter (HOSPITAL_COMMUNITY): Payer: Medicare HMO

## 2020-10-20 DIAGNOSIS — I5023 Acute on chronic systolic (congestive) heart failure: Secondary | ICD-10-CM | POA: Diagnosis not present

## 2020-10-20 DIAGNOSIS — I420 Dilated cardiomyopathy: Secondary | ICD-10-CM | POA: Diagnosis not present

## 2020-10-20 DIAGNOSIS — N183 Chronic kidney disease, stage 3 unspecified: Secondary | ICD-10-CM | POA: Diagnosis not present

## 2020-10-20 DIAGNOSIS — E1122 Type 2 diabetes mellitus with diabetic chronic kidney disease: Secondary | ICD-10-CM | POA: Diagnosis not present

## 2020-10-20 DIAGNOSIS — I272 Pulmonary hypertension, unspecified: Secondary | ICD-10-CM | POA: Diagnosis not present

## 2020-10-20 DIAGNOSIS — I4891 Unspecified atrial fibrillation: Secondary | ICD-10-CM | POA: Diagnosis not present

## 2020-10-20 DIAGNOSIS — E1151 Type 2 diabetes mellitus with diabetic peripheral angiopathy without gangrene: Secondary | ICD-10-CM | POA: Diagnosis not present

## 2020-10-20 DIAGNOSIS — I471 Supraventricular tachycardia: Secondary | ICD-10-CM | POA: Diagnosis not present

## 2020-10-20 DIAGNOSIS — I130 Hypertensive heart and chronic kidney disease with heart failure and stage 1 through stage 4 chronic kidney disease, or unspecified chronic kidney disease: Secondary | ICD-10-CM | POA: Diagnosis not present

## 2020-10-21 DIAGNOSIS — E1122 Type 2 diabetes mellitus with diabetic chronic kidney disease: Secondary | ICD-10-CM | POA: Diagnosis not present

## 2020-10-21 DIAGNOSIS — I420 Dilated cardiomyopathy: Secondary | ICD-10-CM | POA: Diagnosis not present

## 2020-10-21 DIAGNOSIS — N183 Chronic kidney disease, stage 3 unspecified: Secondary | ICD-10-CM | POA: Diagnosis not present

## 2020-10-21 DIAGNOSIS — I130 Hypertensive heart and chronic kidney disease with heart failure and stage 1 through stage 4 chronic kidney disease, or unspecified chronic kidney disease: Secondary | ICD-10-CM | POA: Diagnosis not present

## 2020-10-21 DIAGNOSIS — E1151 Type 2 diabetes mellitus with diabetic peripheral angiopathy without gangrene: Secondary | ICD-10-CM | POA: Diagnosis not present

## 2020-10-21 DIAGNOSIS — I5023 Acute on chronic systolic (congestive) heart failure: Secondary | ICD-10-CM | POA: Diagnosis not present

## 2020-10-21 DIAGNOSIS — I4891 Unspecified atrial fibrillation: Secondary | ICD-10-CM | POA: Diagnosis not present

## 2020-10-21 DIAGNOSIS — I471 Supraventricular tachycardia: Secondary | ICD-10-CM | POA: Diagnosis not present

## 2020-10-21 DIAGNOSIS — I272 Pulmonary hypertension, unspecified: Secondary | ICD-10-CM | POA: Diagnosis not present

## 2020-10-22 DIAGNOSIS — I5023 Acute on chronic systolic (congestive) heart failure: Secondary | ICD-10-CM | POA: Diagnosis not present

## 2020-10-22 DIAGNOSIS — E1122 Type 2 diabetes mellitus with diabetic chronic kidney disease: Secondary | ICD-10-CM | POA: Diagnosis not present

## 2020-10-22 DIAGNOSIS — I471 Supraventricular tachycardia: Secondary | ICD-10-CM | POA: Diagnosis not present

## 2020-10-22 DIAGNOSIS — I4891 Unspecified atrial fibrillation: Secondary | ICD-10-CM | POA: Diagnosis not present

## 2020-10-22 DIAGNOSIS — I272 Pulmonary hypertension, unspecified: Secondary | ICD-10-CM | POA: Diagnosis not present

## 2020-10-22 DIAGNOSIS — I130 Hypertensive heart and chronic kidney disease with heart failure and stage 1 through stage 4 chronic kidney disease, or unspecified chronic kidney disease: Secondary | ICD-10-CM | POA: Diagnosis not present

## 2020-10-22 DIAGNOSIS — I420 Dilated cardiomyopathy: Secondary | ICD-10-CM | POA: Diagnosis not present

## 2020-10-22 DIAGNOSIS — N183 Chronic kidney disease, stage 3 unspecified: Secondary | ICD-10-CM | POA: Diagnosis not present

## 2020-10-22 DIAGNOSIS — E1151 Type 2 diabetes mellitus with diabetic peripheral angiopathy without gangrene: Secondary | ICD-10-CM | POA: Diagnosis not present

## 2020-10-23 ENCOUNTER — Other Ambulatory Visit: Payer: Self-pay

## 2020-10-23 ENCOUNTER — Ambulatory Visit: Payer: Medicare HMO | Admitting: Cardiology

## 2020-10-23 ENCOUNTER — Encounter: Payer: Self-pay | Admitting: Cardiology

## 2020-10-23 VITALS — BP 122/70 | HR 81 | Ht 72.0 in | Wt 161.8 lb

## 2020-10-23 DIAGNOSIS — I428 Other cardiomyopathies: Secondary | ICD-10-CM | POA: Diagnosis not present

## 2020-10-23 NOTE — Progress Notes (Signed)
Electrophysiology Office Note   Date:  10/23/2020   ID:  Tavonte, Crudele [birth year], MRN [id number]  PCP:  [name], MD  Cardiologist:  Pernell Dupre Primary Electrophysiologist:  Jawaan Adachi Meredith Leeds, MD    No chief complaint on file.    History of Present Illness: Ryan [name] is a 75 y.o. male who presents today for electrophysiology evaluation.      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  He is having PVCs with an elevated burden of 20% and was found to have an EF of 30 to 35%.  He is now status post Medtronic ICD.  He was admitted to the hospital December 2018 with a heart failure exacerbation.  Heart failure team was consulted.  He required Lasix drip and milrinone.  He presented to cardiology clinic in the ED hydrated.  His Lasix was held.  He was also put on amiodarone from mexiletine for his PVCs.  Today, denies symptoms of palpitations, chest pain, shortness of breath, orthopnea, PND, lower extremity edema, claudication, dizziness, presyncope, syncope, bleeding, or neurologic sequela. The patient is tolerating medications without difficulties. Overall he is feeling well. He has no chest pain or shortness of breath. He is able to do all of his daily activities. He feels much better since he was hospitalized. He was also found to be significantly dehydrated. Now that he is well-hydrated, he is comfortable with his overall health.   Past Medical History:  Diagnosis Date  . AICD (automatic cardioverter/defibrillator) present   . Atrial fibrillation ([place])   . 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 CHF (congestive heart failure) ([place])   . Diabetes mellitus without complication ([place])   . Endocarditis   . Hypertension   . Peripheral vascular disease ([place])   . PVC's (premature ventricular contractions)   . SVT (supraventricular tachycardia)  long RP     Past Surgical History:  Procedure Laterality Date  . ABDOMINAL AORTAGRAM N/A 10/03/2012   Procedure: ABDOMINAL Maxcine Ham;  Surgeon: [name], MD;  Location[facility] CATH LAB;  Service: Cardiovascular;  Laterality: N/A;  . CORONARY ANGIOGRAM  09/21/2012   Procedure: CORONARY ANGIOGRAM;  Surgeon: [name], MD;  Location: [facility] CATH LAB;  Service: Cardiovascular;;  . EP IMPLANTABLE DEVICE N/A 06/11/2016   Procedure: ICD Implant;  Surgeon: Jannessa Ogden Meredith Leeds, MD;  Location: [place] CV LAB;  Service: Cardiovascular;  Laterality: N/A;  . EXTREMITY WIRE/PIN REMOVAL  09/14/2012   Procedure: REMOVAL K-WIRE/PIN EXTREMITY;  Surgeon: [name], MD;  Location: [place];  Service: Orthopedics;  Laterality: Right;  Right Foot  . I & D EXTREMITY  09/14/2012   Procedure: IRRIGATION AND DEBRIDEMENT EXTREMITY;  Surgeon: Tennis Must, MD;  Location: [place];  Service: Orthopedics;  Laterality: Right;  . INTRAOPERATIVE TRANSESOPHAGEAL ECHOCARDIOGRAM  09/26/2012   Procedure: INTRAOPERATIVE TRANSESOPHAGEAL ECHOCARDIOGRAM;  Surgeon: [name], MD;  Location[facility] OR;  Service: Open Heart Surgery;  Laterality: N/A;  . MITRAL VALVE REPLACEMENT  09/26/2012   Procedure: MITRAL VALVE (MV) REPLACEMENT;  Surgeon: [name], MD;  Location: [place] OR;  Service: Open Heart Surgery;  Laterality: N/A;  . RIGHT HEART CATH N/A 08/29/2020   Procedure: RIGHT HEART CATH;  Surgeon: Larey Dresser, MD;  Location: [place] CV LAB;  Service: Cardiovascular;  Laterality: N/A;  . RIGHT HEART CATHETERIZATION  09/21/2012   Procedure: RIGHT HEART CATH;  Surgeon: Mallie Mussel  Carlye Grippe, MD;  Location: [facility] CATH LAB;  Service: Cardiovascular;;  . SVT ABLATION N/A 09/03/2020   Procedure: SVT ABLATION;  Surgeon: Constance Haw, MD;  Location: [place] CV LAB;  Service: Cardiovascular;  Laterality: N/A;  . TEE WITHOUT CARDIOVERSION  09/18/2012   Procedure: TRANSESOPHAGEAL ECHOCARDIOGRAM (TEE);  Surgeon: [name], MD;   Location[facility] ENDOSCOPY;  Service: Cardiovascular;  Laterality: N/A;     Current Outpatient Medications  Medication Sig Dispense Refill  . acetaminophen (TYLENOL) 325 MG tablet Take 650 mg by mouth every 6 (six) hours as needed for mild pain or headache.    Marland Kitchen amiodarone (PACERONE) 200 MG tablet Take 1 tablet (200 mg total) by mouth daily. 30 tablet 6  . atorvastatin (LIPITOR) 40 MG tablet Take 1 tablet (40 mg total) by mouth daily at 6 PM. 30 tablet 5  . empagliflozin (JARDIANCE) 10 MG TABS tablet Take 1 tablet (10 mg total) by mouth daily. 30 tablet 6  . HYDROcodone-acetaminophen (NORCO/VICODIN) 5-325 MG per tablet Take 1 tablet by mouth 2 (two) times daily as needed for moderate pain.     . metoprolol succinate (TOPROL-XL) 25 MG 24 hr tablet Take 1 tablet (25 mg total) by mouth daily. 30 tablet 6  . Multiple Vitamins-Minerals (CENTRUM SILVER PO) Take 1 tablet by mouth 3 (three) times a week.    . pantoprazole (PROTONIX) 40 MG tablet Take 40 mg by mouth daily.     . tamsulosin (FLOMAX) 0.4 MG CAPS capsule Take 0.4 mg by mouth daily.     Marland Kitchen triamcinolone cream (KENALOG) 0.5 % Apply 1 application topically daily as needed (skin rash).     . warfarin (COUMADIN) 2.5 MG tablet Take 2.'5mg'$  ( 1 tablet ) Monday, wednesday and Friday  Other days take '5mg'$  (2 tablets) 44 tablet 1   No current facility-administered medications for this visit.    Allergies:   [name] allergy]   Social History:  The patient  reports that he has never smoked. He has never used smokeless tobacco. He reports that he does not drink alcohol and does not use drugs.   Family History:  The patient's family history includes Hypertension in his father and mother.   ROS:  Please see the history of present illness.   Otherwise, review of systems is positive for none.   All other systems are reviewed and negative.   PHYSICAL EXAM: VS:  BP 122/70   Pulse 81   Ht 6' (1.829 m)   Wt 161 lb 12.8 oz (73.4 kg)   SpO2 98%   BMI  21.94 kg/m  , BMI Body mass index is 21.94 kg/m. GEN: Well nourished, well developed, in no acute distress  HEENT: normal  Neck: no JVD, carotid bruits, or masses Cardiac: RRR; no murmurs, rubs, or gallops,no edema  Respiratory:  clear to auscultation bilaterally, normal work of breathing GI: soft, nontender, nondistended, + BS MS: no deformity or atrophy  Skin: warm and dry, device site well healed Neuro:  Strength and sensation are intact Psych: euthymic mood, full affect  EKG:  EKG is ordered today. Personal review of the ekg ordered shows atrial paced, first-degree AV block, rate 81  Personal review of the device interrogation today. Results in [place]: 08/25/2020: NT-Pro BNP 21,395 08/26/2020: B Natriuretic Peptide 2,235.1 08/31/2020: Magnesium 1.9 09/22/2020: Hemoglobin 13.4; Platelets 372 10/14/2020: BUN 22; Creatinine, Ser 1.50; Potassium 4.0; Sodium 139    Lipid Panel     Component  Value Date/Time   CHOL 131 08/27/2020 0108   TRIG 42 08/27/2020 0108   HDL 40 (L) 08/27/2020 0108   CHOLHDL 3.3 08/27/2020 0108   VLDL 8 08/27/2020 0108   LDLCALC 83 08/27/2020 0108     Wt Readings from Last 3 Encounters:  10/23/20 161 lb 12.8 oz (73.4 kg)  10/14/20 160 lb 3.2 oz (72.7 kg)  09/23/20 157 lb 12.8 oz (71.6 kg)      Other studies Reviewed: Additional studies/ records that were reviewed today include: 48 hour monitor  Review of the above records today demonstrates:  Minimum: 59 bpm at 9:31 PM Mean: 88 bpm mV Maximum: 134 bpm at 9:44 AM Ventricular ectopy 22.08% of beats Supraventricular events 0.92% of beats Short runs of SVT  TTE 04/26/16 - Left ventricle: The cavity size was normal. Systolic function was   moderately to severely reduced. The estimated ejection fraction   was in the range of 30% to 35%. Diffuse hypokinesis, worse in the   septum. - Aortic valve: Transvalvular velocity was within the normal range.   There was no stenosis. There was  no regurgitation. - Mitral valve: A bioprosthesis was present. Pressure half-time: 63   ms. Mean gradient (D): 12 mm Hg. - Left atrium: The atrium was moderately dilated. - Right ventricle: The cavity size was normal. Wall thickness was   normal. Systolic function was moderately reduced. - Tricuspid valve: There was no regurgitation. - Inferior vena cava: The vessel was normal in size. The   respirophasic diameter changes were in the normal range (>= 50%),   consistent with normal central venous pressure.  Impressions:  - Mean gradient across the mitral valve is significantly increased   compared with 12/03/15. 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 Also, the TVI   ratio is 3.4, consistent with pathologic obstruction. 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   ASSESSMENT AND PLAN:  1.  Junctional rhythm: Status post Medtronic ICD implanted 06/11/2016.  Device functioning appropriately.  No changes.    2.  Paroxysmal atrial fibrillation: Currently on Coumadin.  CHA2DS2-VASc of 3.  Found on device interrogation.  3.  PVCs: Currently on amiodarone.  High risk medication monitoring. Minimal noted. No changes.  4.  Mitral valve repair: Stable on most recent echo  5.  Chronic systolic heart failure due to nonischemic cardiomyopathy: Status post Medtronic ICD. No evidence of volume overload. He does have a significantly overall prolonged PR interval, but he is a pacing, ventricular sensing. No changes.  Current asked .medicines are reviewed at length with the patient today.   The patient does not have concerns regarding his medicines.  The following changes were made today: None  Labs/ tests ordered today include:  Orders Placed This Encounter  Procedures  . EKG 12-Lead   Disposition:   FU with [facility] [place] 6  months  Signed, Nagee Goates Meredith Leeds, MD  10/23/2020 3:53 PM     [place] [address] [place] [address] [phone] (office) [phone] (fax)

## 2020-10-27 DIAGNOSIS — E1122 Type 2 diabetes mellitus with diabetic chronic kidney disease: Secondary | ICD-10-CM | POA: Diagnosis not present

## 2020-10-27 DIAGNOSIS — I471 Supraventricular tachycardia: Secondary | ICD-10-CM | POA: Diagnosis not present

## 2020-10-27 DIAGNOSIS — I420 Dilated cardiomyopathy: Secondary | ICD-10-CM | POA: Diagnosis not present

## 2020-10-27 DIAGNOSIS — I272 Pulmonary hypertension, unspecified: Secondary | ICD-10-CM | POA: Diagnosis not present

## 2020-10-27 DIAGNOSIS — I5023 Acute on chronic systolic (congestive) heart failure: Secondary | ICD-10-CM | POA: Diagnosis not present

## 2020-10-27 DIAGNOSIS — E1151 Type 2 diabetes mellitus with diabetic peripheral angiopathy without gangrene: Secondary | ICD-10-CM | POA: Diagnosis not present

## 2020-10-27 DIAGNOSIS — I130 Hypertensive heart and chronic kidney disease with heart failure and stage 1 through stage 4 chronic kidney disease, or unspecified chronic kidney disease: Secondary | ICD-10-CM | POA: Diagnosis not present

## 2020-10-27 DIAGNOSIS — I4891 Unspecified atrial fibrillation: Secondary | ICD-10-CM | POA: Diagnosis not present

## 2020-10-27 DIAGNOSIS — N183 Chronic kidney disease, stage 3 unspecified: Secondary | ICD-10-CM | POA: Diagnosis not present

## 2020-10-29 DIAGNOSIS — N1832 Chronic kidney disease, stage 3b: Secondary | ICD-10-CM | POA: Diagnosis not present

## 2020-10-29 DIAGNOSIS — I429 Cardiomyopathy, unspecified: Secondary | ICD-10-CM | POA: Diagnosis not present

## 2020-10-29 DIAGNOSIS — J309 Allergic rhinitis, unspecified: Secondary | ICD-10-CM | POA: Diagnosis not present

## 2020-10-29 DIAGNOSIS — N529 Male erectile dysfunction, unspecified: Secondary | ICD-10-CM | POA: Diagnosis not present

## 2020-10-29 DIAGNOSIS — E1140 Type 2 diabetes mellitus with diabetic neuropathy, unspecified: Secondary | ICD-10-CM | POA: Diagnosis not present

## 2020-10-29 DIAGNOSIS — Z7901 Long term (current) use of anticoagulants: Secondary | ICD-10-CM | POA: Diagnosis not present

## 2020-10-29 DIAGNOSIS — I482 Chronic atrial fibrillation, unspecified: Secondary | ICD-10-CM | POA: Diagnosis not present

## 2020-10-29 DIAGNOSIS — I509 Heart failure, unspecified: Secondary | ICD-10-CM | POA: Diagnosis not present

## 2020-10-29 DIAGNOSIS — M659 Synovitis and tenosynovitis, unspecified: Secondary | ICD-10-CM | POA: Diagnosis not present

## 2020-10-30 DIAGNOSIS — I272 Pulmonary hypertension, unspecified: Secondary | ICD-10-CM | POA: Diagnosis not present

## 2020-10-30 DIAGNOSIS — I130 Hypertensive heart and chronic kidney disease with heart failure and stage 1 through stage 4 chronic kidney disease, or unspecified chronic kidney disease: Secondary | ICD-10-CM | POA: Diagnosis not present

## 2020-10-30 DIAGNOSIS — I4891 Unspecified atrial fibrillation: Secondary | ICD-10-CM | POA: Diagnosis not present

## 2020-10-30 DIAGNOSIS — I471 Supraventricular tachycardia: Secondary | ICD-10-CM | POA: Diagnosis not present

## 2020-10-30 DIAGNOSIS — N183 Chronic kidney disease, stage 3 unspecified: Secondary | ICD-10-CM | POA: Diagnosis not present

## 2020-10-30 DIAGNOSIS — E1151 Type 2 diabetes mellitus with diabetic peripheral angiopathy without gangrene: Secondary | ICD-10-CM | POA: Diagnosis not present

## 2020-10-30 DIAGNOSIS — I5023 Acute on chronic systolic (congestive) heart failure: Secondary | ICD-10-CM | POA: Diagnosis not present

## 2020-10-30 DIAGNOSIS — I420 Dilated cardiomyopathy: Secondary | ICD-10-CM | POA: Diagnosis not present

## 2020-10-30 DIAGNOSIS — E1122 Type 2 diabetes mellitus with diabetic chronic kidney disease: Secondary | ICD-10-CM | POA: Diagnosis not present

## 2020-11-03 DIAGNOSIS — E1122 Type 2 diabetes mellitus with diabetic chronic kidney disease: Secondary | ICD-10-CM | POA: Diagnosis not present

## 2020-11-03 DIAGNOSIS — I4891 Unspecified atrial fibrillation: Secondary | ICD-10-CM | POA: Diagnosis not present

## 2020-11-03 DIAGNOSIS — I5023 Acute on chronic systolic (congestive) heart failure: Secondary | ICD-10-CM | POA: Diagnosis not present

## 2020-11-03 DIAGNOSIS — I471 Supraventricular tachycardia: Secondary | ICD-10-CM | POA: Diagnosis not present

## 2020-11-03 DIAGNOSIS — I272 Pulmonary hypertension, unspecified: Secondary | ICD-10-CM | POA: Diagnosis not present

## 2020-11-03 DIAGNOSIS — E1151 Type 2 diabetes mellitus with diabetic peripheral angiopathy without gangrene: Secondary | ICD-10-CM | POA: Diagnosis not present

## 2020-11-03 DIAGNOSIS — I130 Hypertensive heart and chronic kidney disease with heart failure and stage 1 through stage 4 chronic kidney disease, or unspecified chronic kidney disease: Secondary | ICD-10-CM | POA: Diagnosis not present

## 2020-11-03 DIAGNOSIS — N183 Chronic kidney disease, stage 3 unspecified: Secondary | ICD-10-CM | POA: Diagnosis not present

## 2020-11-03 DIAGNOSIS — I420 Dilated cardiomyopathy: Secondary | ICD-10-CM | POA: Diagnosis not present

## 2020-11-05 DIAGNOSIS — N1832 Chronic kidney disease, stage 3b: Secondary | ICD-10-CM | POA: Diagnosis not present

## 2020-11-05 DIAGNOSIS — E1122 Type 2 diabetes mellitus with diabetic chronic kidney disease: Secondary | ICD-10-CM | POA: Diagnosis not present

## 2020-11-05 DIAGNOSIS — I471 Supraventricular tachycardia: Secondary | ICD-10-CM | POA: Diagnosis not present

## 2020-11-05 DIAGNOSIS — I420 Dilated cardiomyopathy: Secondary | ICD-10-CM | POA: Diagnosis not present

## 2020-11-05 DIAGNOSIS — E1159 Type 2 diabetes mellitus with other circulatory complications: Secondary | ICD-10-CM | POA: Diagnosis not present

## 2020-11-05 DIAGNOSIS — R791 Abnormal coagulation profile: Secondary | ICD-10-CM | POA: Diagnosis not present

## 2020-11-05 DIAGNOSIS — I272 Pulmonary hypertension, unspecified: Secondary | ICD-10-CM | POA: Diagnosis not present

## 2020-11-05 DIAGNOSIS — I5023 Acute on chronic systolic (congestive) heart failure: Secondary | ICD-10-CM | POA: Diagnosis not present

## 2020-11-05 DIAGNOSIS — I482 Chronic atrial fibrillation, unspecified: Secondary | ICD-10-CM | POA: Diagnosis not present

## 2020-11-05 DIAGNOSIS — J309 Allergic rhinitis, unspecified: Secondary | ICD-10-CM | POA: Diagnosis not present

## 2020-11-05 DIAGNOSIS — I429 Cardiomyopathy, unspecified: Secondary | ICD-10-CM | POA: Diagnosis not present

## 2020-11-05 DIAGNOSIS — I4891 Unspecified atrial fibrillation: Secondary | ICD-10-CM | POA: Diagnosis not present

## 2020-11-05 DIAGNOSIS — N529 Male erectile dysfunction, unspecified: Secondary | ICD-10-CM | POA: Diagnosis not present

## 2020-11-05 DIAGNOSIS — M25552 Pain in left hip: Secondary | ICD-10-CM | POA: Diagnosis not present

## 2020-11-05 DIAGNOSIS — N183 Chronic kidney disease, stage 3 unspecified: Secondary | ICD-10-CM | POA: Diagnosis not present

## 2020-11-05 DIAGNOSIS — E1151 Type 2 diabetes mellitus with diabetic peripheral angiopathy without gangrene: Secondary | ICD-10-CM | POA: Diagnosis not present

## 2020-11-05 DIAGNOSIS — I509 Heart failure, unspecified: Secondary | ICD-10-CM | POA: Diagnosis not present

## 2020-11-05 DIAGNOSIS — I130 Hypertensive heart and chronic kidney disease with heart failure and stage 1 through stage 4 chronic kidney disease, or unspecified chronic kidney disease: Secondary | ICD-10-CM | POA: Diagnosis not present

## 2020-11-06 NOTE — Progress Notes (Signed)
Advanced Heart Failure Clinic Note   PCP: [name], MD PCP-Cardiologist: [name], MD  EP: Dr. [name] HF Cardiologist: Dr. Aundra Dubin  Reason for Visit: F/u for Systolic Heart Failure  HPI: Ryan [name] is a 75 y.o. male with a history of endocarditis s/p mitral valve replacement in 2014, atrial fibrillation/flutter, HTN, DM2, PAD (followed by vascular) chronic systolic heart failure, embolic CVA, anticoagulated with coumadin, significant PVC burden (20%) with an EF of 30-35% and accelerated junctional rhythm - status post medtronic ICD.  Hehas a history of endocarditis that resulted in mitral valve replacement in 2014. Heart cath prior to that surgery showed widely patent coronaries and moderate to severe pulmonary hypertension.  Recently dealing with volume overload. Failed outpatient tx. Seen by Dr. [name] in clinic 08/25/2020 and recommended directed admission via ER given bed issue &  BNP of > 60454. Also had intermittent chest pain. EKG revealed junctional tachycardia in the 120s. High-sensitivity troponin is minimally elevated at 25-->30.CXR showed small bilateral pleural effusions.   He initially had poor response to IV diuretics w/ rising SCr. RHC showed evidence for severe biventricular failure with PCWP and RA pressure >30 and PAPi 0.8. Equalization of diastolic pressures concerning for restrictive physiology. Cardiac output preserved (CI 2.28 Fick, 2.33 thermo). Was transferred to CCU for lasix gtt and later required addition of milrinone to aid in diuresis. Also underwent SVT ablation ( felt slow AVNRT). Also had frequent PVCs treated w/ amiodarone. Of note LHC was not pursued given abnormal renal function.   He was diuresed and transitioned to PO diuretics. SCr down to 1.74 day of d/c (down from peak of 2.4). He tolerated addition of spiro and Jardiance ok. PT had recommended SNF but pt declined, opting for home health PT instead.   He presented 1/22 to  clinic for f/u with his daughter. He also saw general cardiology and was felt to be volume depleted. Wt down 6 lb from d/c wt and he reports poor PO intake. BP soft 106/80. BMP showed elevated SCr at 2.24, BUN 42. K also elevated at 5.6. Optivol fluid analysis is also consistent w/ volume depletion. Index down, impedence up above reference curve. Lasix stopped and spiro held this visit.  Today he returns for HF follow up with his daughter. Overall feeling fine, still weak and using walker and cane at home. Wants to start back at the [facility] to increase his physical activity. Denies increasing SOB, CP, dizziness, edema, or PND/Orthopnea. Appetite ok. No fever or chills. Weight at home ~158 pounds. Taking all medications. Has not needed to take lasix.   Review of systems complete and found to be negative unless listed in HPI.    Past Medical History:  Diagnosis Date   AICD (automatic cardioverter/defibrillator) present    Atrial fibrillation (HCC)    Cardiomyopathy, dilated (HCC)    CHF (congestive heart failure) (HCC)    Diabetes mellitus without complication ([place])    Endocarditis    Hypertension    Peripheral vascular disease (HCC)    PVC's (premature ventricular contractions)    SVT (supraventricular tachycardia)  long RP     Current Outpatient Medications  Medication Sig Dispense Refill   acetaminophen (TYLENOL) 325 MG tablet Take 650 mg by mouth every 6 (six) hours as needed for mild pain or headache.     amiodarone (PACERONE) 200 MG tablet Take 1 tablet (200 mg total) by mouth daily. 30 tablet 6   atorvastatin (LIPITOR) 40 MG tablet Take 1  tablet (40 mg total) by mouth daily at 6 PM. 30 tablet 5   HYDROcodone-acetaminophen (NORCO/VICODIN) 5-325 MG per tablet Take 1 tablet by mouth 2 (two) times daily as needed for moderate pain.      losartan (COZAAR) 25 MG tablet Take 0.5 tablets (12.5 mg total) by mouth daily. 45 tablet 3   metoprolol succinate (TOPROL-XL) 25 MG 24 hr  tablet Take 1 tablet (25 mg total) by mouth daily. 30 tablet 6   Multiple Vitamins-Minerals (CENTRUM SILVER PO) Take 1 tablet by mouth 3 (three) times a week.     pantoprazole (PROTONIX) 40 MG tablet Take 40 mg by mouth daily.      tamsulosin (FLOMAX) 0.4 MG CAPS capsule Take 0.4 mg by mouth daily.      triamcinolone cream (KENALOG) 0.5 % Apply 1 application topically daily as needed (skin rash).      warfarin (COUMADIN) 2.5 MG tablet Take 2.'5mg'$  ( 1 tablet ) Monday, wednesday and Friday  Other days take '5mg'$  (2 tablets) 44 tablet 1   empagliflozin (JARDIANCE) 10 MG TABS tablet Take 1 tablet (10 mg total) by mouth daily. 90 tablet 3   No current facility-administered medications for this encounter.    Allergies  Allergen Reactions   Geralyn Flash [Fish Allergy] Nausea And Vomiting      Social History   Socioeconomic History   Marital status: Legally Separated    Spouse name: Not on file   Number of children: 3   Years of education: Not on file   Highest education level: Not on file  Occupational History   Not on file  Tobacco Use   Smoking status: Never Smoker   Smokeless tobacco: Never Used  Vaping Use   Vaping Use: Never used  Substance and Sexual Activity   Alcohol use: No    Alcohol/week: 0.0 standard drinks   Drug use: No   Sexual activity: Not on file  Other Topics Concern   Not on file  Social History Narrative   Daughter lives with him.  Retired Advertising account planner   Social Determinants of Radio broadcast assistant Strain: Not on [employer] Insecurity: No Food Insecurity   Worried About Charity fundraiser in the Last Year: Never true   Arboriculturist in the Last Year: Never true  Transportation Needs: No Data processing manager (Medical): No   Lack of Transportation (Non-Medical): No  Physical Activity: Not on file  Stress: Not on file  Social Connections: Not on file  Intimate Partner Violence: Not on file     Family  History  Problem Relation Age of Onset   Hypertension Mother    Hypertension Father     Vitals:   11/07/20 1139  BP: (!) 156/82  Pulse: 64  SpO2: 100%  Weight: 75.7 kg (166 lb 12.8 oz)   Wt Readings from Last 3 Encounters:  11/07/20 75.7 kg (166 lb 12.8 oz)  10/23/20 73.4 kg (161 lb 12.8 oz)  10/14/20 72.7 kg (160 lb 3.2 oz)   PHYSICAL EXAM: General:  Thin/frail, using rolling walker. NAD. No resp difficulty HEENT: Normal Neck: Supple. No JVD. Carotids 2+ bilat; no bruits. No lymphadenopathy or thryomegaly appreciated. Cor: PMI nondisplaced. Regular rate & rhythm. No rubs, gallops or murmurs. Lungs: Clear Abdomen: Soft, nontender, nondistended. No hepatosplenomegaly. No bruits or masses. Good bowel sounds. Extremities: No cyanosis, clubbing, rash, edema Neuro: alert & oriented x 3, cranial nerves grossly intact. Moves all 4  extremities w/o difficulty. Affect pleasant.  Optivol: Fluid trending back up towards baseline, thoracic impedence just below baseline, ~0.5 hrs activity/day, no VT/VF (personally reviewed).  ASSESSMENT & PLAN: 1. Chronic Systolic CHF: Biventricular failure. Has MDT ICD. Echo 12/20: EF 20-25%, moderate RV enlargement with severely decreased RV systolic function, normally functioning bioprosthetic mitral valve with no significant MR and mean gradient 6, moderate TR, dilated IVC. Patient had normal EF in 12/13 pre-mitral valve replacement. 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 His last cath was in 1/14 pre-surgery, no significant CAD. On [place] 12/10, evidence for severe biventricular failure with PCWP and RA pressure >30 and PAPi 0.8. Equalization of diastolic pressures concerning for restrictive physiology. Cardiac output preserved (CI 2.28 Fick, 2.33 thermo). It is possible that cardiomyopathy is at least in part tachycardia-mediated given frequent runs of SVT (though  rate is relatively slow, 110s for the most part). Also has history of frequent PVCs. Cannot rule out coronary disease, though no coronary angiography yet with elevated creatinine.  - NYHA III, functional status complicated by general physical deconditioning. Volume good today - He does not need scheduled diuretics, lasix prn for weight of 159 lbs on home scale. - QRS too narrow to reprogram CRT-D. - Start losartan 12.5 mg qhs. BMET today & repeat 7-10 days. - Continue Toprol XL 25 mg daily. - Continue Jardiance 10 mg daily. - Hold spiro w/ elevated SCr. May be able to add back at next visit. - No Bidil for now, consider in future if BP stabilizes.  - He is not an Anthem candidate due to his current need for walker. - Schedule CPX to better quantify functional status from HF perspective. - Eventual coronary angiography in the future to assess for CAD as cause of worsened EF, if creatinine stabilizes.  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  - S/P ablation 09/03/20.  - Maintaining NSR, regular on exam today. - We need to keep in NSR if at all possible given concern for tachy-mediated CMP.  3. PVCs: History of very frequent PVCs. This may play a role in biventricular failure.  - Continue PO amio 200 mg daily.    - Will need TFTs and HFTs next visit. 4. PAD: h/o right popliteal occlusion. Continue atorvastatin.  5. Atrial fibrillation: Paroxysmal.  Has history of CVA.  - Regular on exam today. - On warfarin. INRs followed by PCP in [place]. 6. 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  7. CKD stage 3:  - Baseline creatinine ~1.7-1.8. - Continue to hold spiro as outlined above.  - If renal function dose not improve w/ diuretic hold, will need to reassess for home inotrope needs  8. [facility] PT initially recommended SNF for rehab but pt refused, opting for home health PT instead.  - Continue w/ HHPT.  F/u in 3 weeks in APP clinic, schedule CPX,  and follow back in Dr. Aundra Dubin in 2 months.  [place], FNP-BC 11/07/20

## 2020-11-07 ENCOUNTER — Encounter: Payer: Self-pay | Admitting: Interventional Cardiology

## 2020-11-07 ENCOUNTER — Encounter (HOSPITAL_COMMUNITY): Payer: Self-pay

## 2020-11-07 ENCOUNTER — Other Ambulatory Visit: Payer: Self-pay

## 2020-11-07 ENCOUNTER — Ambulatory Visit (HOSPITAL_COMMUNITY)
Admission: RE | Admit: 2020-11-07 | Discharge: 2020-11-07 | Disposition: A | Discharge status: Home / Self Care | Payer: Medicare HMO | Source: Ambulatory Visit | Attending: Family Medicine | Admitting: Family Medicine

## 2020-11-07 VITALS — BP 156/82 | HR 64 | Wt 166.8 lb

## 2020-11-07 DIAGNOSIS — Z7901 Long term (current) use of anticoagulants: Secondary | ICD-10-CM | POA: Insufficient documentation

## 2020-11-07 DIAGNOSIS — Z8249 Family history of ischemic heart disease and other diseases of the circulatory system: Secondary | ICD-10-CM | POA: Diagnosis not present

## 2020-11-07 DIAGNOSIS — R5381 Other malaise: Secondary | ICD-10-CM

## 2020-11-07 DIAGNOSIS — Z79899 Other long term (current) drug therapy: Secondary | ICD-10-CM | POA: Diagnosis not present

## 2020-11-07 DIAGNOSIS — Z8673 Personal history of transient ischemic attack (TIA), and cerebral infarction without residual deficits: Secondary | ICD-10-CM | POA: Diagnosis not present

## 2020-11-07 DIAGNOSIS — I130 Hypertensive heart and chronic kidney disease with heart failure and stage 1 through stage 4 chronic kidney disease, or unspecified chronic kidney disease: Secondary | ICD-10-CM | POA: Insufficient documentation

## 2020-11-07 DIAGNOSIS — N1832 Chronic kidney disease, stage 3b: Secondary | ICD-10-CM | POA: Diagnosis not present

## 2020-11-07 DIAGNOSIS — I5022 Chronic systolic (congestive) heart failure: Secondary | ICD-10-CM

## 2020-11-07 DIAGNOSIS — Z953 Presence of xenogenic heart valve: Secondary | ICD-10-CM | POA: Diagnosis not present

## 2020-11-07 DIAGNOSIS — I480 Paroxysmal atrial fibrillation: Secondary | ICD-10-CM | POA: Diagnosis not present

## 2020-11-07 DIAGNOSIS — I5082 Biventricular heart failure: Secondary | ICD-10-CM | POA: Diagnosis not present

## 2020-11-07 DIAGNOSIS — I779 Disorder of arteries and arterioles, unspecified: Secondary | ICD-10-CM

## 2020-11-07 DIAGNOSIS — I420 Dilated cardiomyopathy: Secondary | ICD-10-CM | POA: Insufficient documentation

## 2020-11-07 DIAGNOSIS — R531 Weakness: Secondary | ICD-10-CM | POA: Diagnosis not present

## 2020-11-07 DIAGNOSIS — I471 Supraventricular tachycardia, unspecified: Secondary | ICD-10-CM

## 2020-11-07 DIAGNOSIS — N183 Chronic kidney disease, stage 3 unspecified: Secondary | ICD-10-CM | POA: Diagnosis not present

## 2020-11-07 DIAGNOSIS — I493 Ventricular premature depolarization: Secondary | ICD-10-CM

## 2020-11-07 DIAGNOSIS — I272 Pulmonary hypertension, unspecified: Secondary | ICD-10-CM | POA: Insufficient documentation

## 2020-11-07 DIAGNOSIS — E1151 Type 2 diabetes mellitus with diabetic peripheral angiopathy without gangrene: Secondary | ICD-10-CM | POA: Diagnosis not present

## 2020-11-07 DIAGNOSIS — J90 Pleural effusion, not elsewhere classified: Secondary | ICD-10-CM | POA: Insufficient documentation

## 2020-11-07 DIAGNOSIS — E1122 Type 2 diabetes mellitus with diabetic chronic kidney disease: Secondary | ICD-10-CM | POA: Diagnosis not present

## 2020-11-07 DIAGNOSIS — Z7984 Long term (current) use of oral hypoglycemic drugs: Secondary | ICD-10-CM | POA: Insufficient documentation

## 2020-11-07 LAB — BASIC METABOLIC PANEL
Anion gap: 8 (ref 5–15)
BUN: 23 mg/dL (ref 8–23)
CO2: 23 mmol/L (ref 22–32)
Calcium: 9.4 mg/dL (ref 8.9–10.3)
Chloride: 108 mmol/L (ref 98–111)
Creatinine, Ser: 1.51 mg/dL — ABNORMAL HIGH (ref 0.61–1.24)
GFR, Estimated: 48 mL/min — ABNORMAL LOW (ref >60)
Glucose, Bld: 91 mg/dL (ref 70–99)
Potassium: 4.4 mmol/L (ref 3.5–5.1)
Sodium: 139 mmol/L (ref 135–145)

## 2020-11-07 MED ORDER — LOSARTAN POTASSIUM 25 MG PO TABS: 12.5000 mg | ORAL_TABLET | Freq: Every day | ORAL | 3 refills | Status: DC

## 2020-11-07 MED ORDER — EMPAGLIFLOZIN 10 MG PO TABS: 10.0000 mg | ORAL_TABLET | Freq: Every day | ORAL | 3 refills | Status: DC

## 2020-11-07 NOTE — Telephone Encounter (Signed)
Error

## 2020-11-07 NOTE — Patient Instructions (Addendum)
START Losartan 12.'5mg'$  (1/2 tablet) daily  Labs done today, your results will be available in MyChart, we will contact you for abnormal readings.  Your physician recommends that you schedule repeat labs in 7-10 days  Your physician recommends that you schedule a follow-up appointment in: 3 weeks with pharmacy  If you have any questions or concerns before your next appointment please send [place] a message through [place] or call our office at [phone].    TO LEAVE A MESSAGE FOR THE NURSE SELECT OPTION 2, PLEASE LEAVE A MESSAGE INCLUDING: . YOUR NAME . DATE OF BIRTH . CALL BACK NUMBER . REASON FOR CALL**this is important as we prioritize the call backs  YOU WILL RECEIVE A CALL BACK THE SAME DAY AS LONG AS YOU CALL BEFORE 4:00 PM

## 2020-11-10 ENCOUNTER — Ambulatory Visit (INDEPENDENT_AMBULATORY_CARE_PROVIDER_SITE_OTHER): Payer: Medicare HMO

## 2020-11-10 DIAGNOSIS — Z95810 Presence of automatic (implantable) cardiac defibrillator: Secondary | ICD-10-CM

## 2020-11-10 DIAGNOSIS — I471 Supraventricular tachycardia: Secondary | ICD-10-CM | POA: Diagnosis not present

## 2020-11-10 DIAGNOSIS — I5022 Chronic systolic (congestive) heart failure: Secondary | ICD-10-CM | POA: Diagnosis not present

## 2020-11-10 DIAGNOSIS — I420 Dilated cardiomyopathy: Secondary | ICD-10-CM | POA: Diagnosis not present

## 2020-11-10 DIAGNOSIS — I130 Hypertensive heart and chronic kidney disease with heart failure and stage 1 through stage 4 chronic kidney disease, or unspecified chronic kidney disease: Secondary | ICD-10-CM | POA: Diagnosis not present

## 2020-11-10 DIAGNOSIS — N183 Chronic kidney disease, stage 3 unspecified: Secondary | ICD-10-CM | POA: Diagnosis not present

## 2020-11-10 DIAGNOSIS — E1151 Type 2 diabetes mellitus with diabetic peripheral angiopathy without gangrene: Secondary | ICD-10-CM | POA: Diagnosis not present

## 2020-11-10 DIAGNOSIS — I272 Pulmonary hypertension, unspecified: Secondary | ICD-10-CM | POA: Diagnosis not present

## 2020-11-10 DIAGNOSIS — E1122 Type 2 diabetes mellitus with diabetic chronic kidney disease: Secondary | ICD-10-CM | POA: Diagnosis not present

## 2020-11-10 DIAGNOSIS — I4891 Unspecified atrial fibrillation: Secondary | ICD-10-CM | POA: Diagnosis not present

## 2020-11-10 DIAGNOSIS — I5023 Acute on chronic systolic (congestive) heart failure: Secondary | ICD-10-CM | POA: Diagnosis not present

## 2020-11-11 ENCOUNTER — Other Ambulatory Visit: Payer: Self-pay

## 2020-11-11 MED ORDER — EMPAGLIFLOZIN 10 MG PO TABS: 10.0000 mg | ORAL_TABLET | Freq: Every day | ORAL | 3 refills | Status: DC

## 2020-11-11 NOTE — Telephone Encounter (Signed)
Medication needed to be sent to [facility] order pharmacy. This is a CHF pt. Please address

## 2020-11-12 ENCOUNTER — Telehealth: Payer: Self-pay | Admitting: Interventional Cardiology

## 2020-11-12 DIAGNOSIS — I482 Chronic atrial fibrillation, unspecified: Secondary | ICD-10-CM | POA: Diagnosis not present

## 2020-11-12 DIAGNOSIS — E7800 Pure hypercholesterolemia, unspecified: Secondary | ICD-10-CM | POA: Diagnosis not present

## 2020-11-12 DIAGNOSIS — I509 Heart failure, unspecified: Secondary | ICD-10-CM | POA: Diagnosis not present

## 2020-11-12 DIAGNOSIS — Z7901 Long term (current) use of anticoagulants: Secondary | ICD-10-CM | POA: Diagnosis not present

## 2020-11-12 DIAGNOSIS — L0390 Cellulitis, unspecified: Secondary | ICD-10-CM | POA: Diagnosis not present

## 2020-11-12 DIAGNOSIS — I429 Cardiomyopathy, unspecified: Secondary | ICD-10-CM | POA: Diagnosis not present

## 2020-11-12 DIAGNOSIS — E1159 Type 2 diabetes mellitus with other circulatory complications: Secondary | ICD-10-CM | POA: Diagnosis not present

## 2020-11-12 DIAGNOSIS — J309 Allergic rhinitis, unspecified: Secondary | ICD-10-CM | POA: Diagnosis not present

## 2020-11-12 DIAGNOSIS — N1832 Chronic kidney disease, stage 3b: Secondary | ICD-10-CM | POA: Diagnosis not present

## 2020-11-12 DIAGNOSIS — N529 Male erectile dysfunction, unspecified: Secondary | ICD-10-CM | POA: Diagnosis not present

## 2020-11-12 NOTE — Telephone Encounter (Signed)
I will send this to HF Clinic as Ellen Henri, [facility] has placed referral to [facility].

## 2020-11-12 NOTE — Telephone Encounter (Signed)
Referral placed by CHF team.  Will route to their pool.

## 2020-11-12 NOTE — Telephone Encounter (Signed)
Will send to primary card nurse °

## 2020-11-12 NOTE — Telephone Encounter (Signed)
Colletta Maryland with [facility] is following up regarding a plan of care order, faxed to the office on 10/06/20, 11/06/20, and again this morning. Please return Stephanie's call to discuss at [phone].

## 2020-11-13 NOTE — Telephone Encounter (Signed)
When hospitalized in Dec, inpatient PT recommended SNF for continued rehab but he declined and requested home health instead. Just needs home health PT. Does not need RN nor aide.

## 2020-11-14 NOTE — Progress Notes (Addendum)
PCP: [name], MD PCP-Cardiologist: [name], MD  EP: Dr. [name] HF Cardiologist: Dr. 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  Hehas a history of endocarditis that resulted in mitral valve replacement in 2014. Heart cath prior to that surgery showed widely patent coronaries and moderate to severe pulmonary hypertension.  Recently dealing with volume overload. Failed outpatient tx. Seen by Dr. [name] in clinic 08/25/2020 and recommended directed admission via ER given bed issue &BNP of >21000. Also had intermittent chest pain. EKG revealed junctional tachycardia in the 120s. High-sensitivity troponin is minimally elevated at 25-->30.CXR showed small bilateral pleural effusions.   He initially had poor response to IV diuretics w/ rising SCr. RHC showed evidence for severe biventricular failure with PCWP and RA pressure >30 and PAPi 0.8. Equalization of diastolic pressures concerning for restrictive physiology. Cardiac output preserved (CI 2.28 Fick, 2.33 thermo). Was transferred to CCU for furosemide gtt and later required addition of milrinone to aid in diuresis. Also underwent SVT ablation (felt slow AVNRT). Also had frequent PVCs treated w/ amiodarone. Of note LHC was not pursued given abnormal renal function.   He was diuresed and transitioned to PO diuretics. SCr down to 1.74 day of d/c (down from peak of 2.4). He tolerated addition of spironolactone and Jardiance ok. PT had recommended SNF but pt declined, opting for home health PT instead.   He presented 10/14/20 to clinic for f/u with his daughter. He also saw general cardiology and was felt to  be volume depleted. Weight was down 6 lbs from d/c weight and he reports poor PO intake. BP soft 106/80. BMP showed elevated SCr at 2.24, BUN 42. K also elevated at 5.6. Optivol fluid analysis was also consistent w/ volume depletion. Index down, impedence up above reference curve. Furosemide stopped and spironolactone held that visit.  He recently returned for HF follow up with his daughter on 11/07/20. Overall was feeling fine, but still weak and using walker and cane at home. Wanted to start back at the [facility] to increase his physical activity. Denied increasing SOB, CP, dizziness, edema, or PND/Orthopnea. Appetite was ok. No fever or chills. Weight at home was ~158 pounds. Was taking all medications. Had not needed to take furosemide.  Today he returns to HF clinic for pharmacist medication titration. At last visit with APP, losartan 12.5 mg daily was initiated. Overall he is feeling well today. Notes occasional dizziness which occurs 1-2 times per month. This is not bothersome. His SOB/DOE has improved. He uses a walker. His weight has been stable at home, 155-158 lbs. He does not take any diuretic. No LEE, PND or orthopnea. BP in clinic was 120/64 but states his SBP normally runs in the low 100s at home. Taking all medications as prescribed and tolerating all medications.    HF Medications: Metoprolol succinate 25 mg daily Losartan 12.5 mg daily Jardiance 10 mg daily Furosemide PRN only  Has the patient been experiencing any side effects to the medications prescribed?  no  Does the patient have any problems obtaining medications due to transportation or finances?   Yes - Jardiance is $45/month. Will apply for patient assistance. Has [name].   Understanding of regimen: good Understanding of indications: good Potential of  compliance: good Patient understands to avoid NSAIDs. Patient understands to avoid decongestants.    Pertinent Lab Values: . 11/17/20: Serum creatinine 1.68, BUN  31, Potassium 4.5, Sodium 143  Vital Signs: . Weight: 161.4 lbs (last clinic weight: 161.8 lbs) . Blood pressure: 120/64  . Heart rate: 73   Assessment/Plan: 1. Chronic Systolic CHF: Biventricular failure. Has MDT ICD. Echo 12/20: EF 20-25%, moderate RV enlargement with severely decreased RV systolic function, normally functioning bioprosthetic mitral valve with no significant MR and mean gradient 6, moderate TR, dilated IVC. Patient had normal EF in 12/13 pre-mitral valve replacement. 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 His last cath was in 1/14 pre-surgery, no significant CAD. On [place] 08/29/20, evidence for severe biventricular failure with PCWP and RA pressure >30 and PAPi 0.8. Equalization of diastolic pressures concerning for restrictive physiology. Cardiac output preserved (CI 2.28 Fick, 2.33 thermo). It is possible that cardiomyopathy is at least in part tachycardia-mediated given frequent runs of SVT (though rate is relatively slow, 110s for the most part). Also has history of frequent PVCs. Cannot rule out coronary disease, though no coronary angiography yet with elevated creatinine.  - NYHA III, functional status complicated by general physical deconditioning. Volume status good today - He does not need scheduled diuretics, has furosemide PRN for weight of 159 lbs on home scale. - QRS too narrow to reprogram CRT-D. - Continue metoprolol succinate 25 mg daily. - Continue losartan 12.5 mg qhs. Will not increase today given BP is usually much lower on home BP cuff.  - Restart spironolactone 12.5 mg daily. Repeat BMET locally at [place] in 1 week. Monitor carefully given elevated K on 09/22/2020 labs. We discussed low potassium diet today.  - Continue Jardiance 10 mg daily. Will apply for patient assistance today.  - He is not an Anthem candidate due to his current need for walker. - CPX  scheduled for 12/15/20 to better quantify functional status from HF perspective. - Eventual coronary angiography in the future to assess for CAD as cause of worsened EF, if creatinine stabilizes.  - Return to HF Clinic in 2 months with Dr. 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  - S/P ablation 09/03/20.  - Maintaining NSR, regular on exam today. - We need to keep in NSR if at all possible given concern for tachy-mediated CMP.  3. PVCs: History of very frequent PVCs. This may play a role in biventricular failure.  - Continue PO amiodarone 200 mg daily. - Will need TFTs and HFTs next visit. 4. PAD: h/o right popliteal occlusion. Continue atorvastatin.  5. Atrial fibrillation: Paroxysmal.  Has history of CVA.  - Regular on exam today. - On warfarin. INRs followed by PCP in . 6. 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  7. CKD stage 3:  - Baseline creatinine ~1.7-1.8. 8. [facility] PT initially recommended SNF for rehab but pt refused, opting for home health PT instead.  - Continue w/ HHPT.   Audry Riles, PharmD, BCPS, BCCP, CPP Heart Failure Clinic Pharmacist [phone]

## 2020-11-14 NOTE — Progress Notes (Signed)
EPIC Encounter for ICM Monitoring  Patient Name: Ryan [name] is a 75 y.o. male Date: 11/14/2020 Primary Care Physican: [name], MD Primary Cardiologist: [place] Electrophysiologist: [name] 10/07/2020 Weight: 154.6 lbs                                                            Heart Failure questions reviewed.  Pt is feeling fine and denies any fluid symptoms.    He asked if he should be taking a Klor Con Prescription that was filled for him last week.  He states the prescribing physician listed is [employer] PA.  He thought he was not supposed to take any more Potassium.  Pt seen by Allena Katz, PA at HF clinic on 2/18 and previous notes say Lasix and Potassium were discontinued.  Potassium or Klor con are not currently listed in epic meds and unable to find any reference in office notes that it should be restarted.     Optivol thoracic impedance suggesting possible fluid accumulation on transmission date.   Prescribed: Not taking diuretic since December 2021 hospitalization  Labs: 11/07/2020 Creatinine 1.51, BUN 23, Potassium 4.4, Sodium 139 10/14/2020 Creatinine 1.50, BUN 22, Potassium 4.0, Sodium 139  10/01/2020 Creatinine 1.88, BUN 37, Potassium 4.6, Sodium 140, GFR 34-39 09/22/2020 Creatinine 2.20, BUN 42, Potassium 5.1, Sodium 138, GFR 28--32  A complete set of results can be found in Results Review.  Recommendations:   Advised will ask Allena Katz, [place] for HF clinic if he should be taking Klor Con since his last visit was with her on 11/07/2020.    Follow-up plan: ICM clinic phone appointment on 12/16/2020.   91 day device clinic remote transmission 12/15/2020.    EP/Cardiology Office Visits:  01/12/2021 with Dr Aundra Dubin.  Recall 04/21/2021 with Dr [name]    Copy of ICM check sent to Dr. [name].   3 month ICM trend: 11/10/2020.    1 Year ICM trend:       Rosalene Billings, RN 11/14/2020 4:05 PM

## 2020-11-17 ENCOUNTER — Other Ambulatory Visit: Payer: Self-pay

## 2020-11-17 ENCOUNTER — Ambulatory Visit (HOSPITAL_COMMUNITY)
Admission: RE | Admit: 2020-11-17 | Discharge: 2020-11-17 | Disposition: A | Discharge status: Home / Self Care | Payer: Medicare HMO | Source: Ambulatory Visit | Attending: Internal Medicine | Admitting: Internal Medicine

## 2020-11-17 DIAGNOSIS — I5022 Chronic systolic (congestive) heart failure: Secondary | ICD-10-CM | POA: Insufficient documentation

## 2020-11-17 LAB — BASIC METABOLIC PANEL
Anion gap: 8 (ref 5–15)
BUN: 31 mg/dL — ABNORMAL HIGH (ref 8–23)
CO2: 23 mmol/L (ref 22–32)
Calcium: 9.3 mg/dL (ref 8.9–10.3)
Chloride: 112 mmol/L — ABNORMAL HIGH (ref 98–111)
Creatinine, Ser: 1.68 mg/dL — ABNORMAL HIGH (ref 0.61–1.24)
GFR, Estimated: 42 mL/min — ABNORMAL LOW (ref >60)
Glucose, Bld: 82 mg/dL (ref 70–99)
Potassium: 4.5 mmol/L (ref 3.5–5.1)
Sodium: 143 mmol/L (ref 135–145)

## 2020-11-17 NOTE — Progress Notes (Signed)
Spoke with patient.  [place], NP advised he does not need to take any Klor Con/Potassium prescription at this time.  He verbalized understanding that Potassium will not be a medication he will take.

## 2020-11-17 NOTE — Progress Notes (Signed)
Received: Today [place], Maricela Bo, FNP  Short, Laurie Panda, RN [place]ita Grizzle,   No he can stop his KCl tablet for now.

## 2020-11-17 NOTE — Progress Notes (Signed)
Attempted call to patient and left message for return call.

## 2020-11-19 DIAGNOSIS — I509 Heart failure, unspecified: Secondary | ICD-10-CM | POA: Diagnosis not present

## 2020-11-19 DIAGNOSIS — N1832 Chronic kidney disease, stage 3b: Secondary | ICD-10-CM | POA: Diagnosis not present

## 2020-11-19 DIAGNOSIS — K219 Gastro-esophageal reflux disease without esophagitis: Secondary | ICD-10-CM | POA: Diagnosis not present

## 2020-11-19 DIAGNOSIS — I482 Chronic atrial fibrillation, unspecified: Secondary | ICD-10-CM | POA: Diagnosis not present

## 2020-11-19 DIAGNOSIS — I429 Cardiomyopathy, unspecified: Secondary | ICD-10-CM | POA: Diagnosis not present

## 2020-11-19 DIAGNOSIS — L0390 Cellulitis, unspecified: Secondary | ICD-10-CM | POA: Diagnosis not present

## 2020-11-19 DIAGNOSIS — J309 Allergic rhinitis, unspecified: Secondary | ICD-10-CM | POA: Diagnosis not present

## 2020-11-19 DIAGNOSIS — N1831 Chronic kidney disease, stage 3a: Secondary | ICD-10-CM | POA: Diagnosis not present

## 2020-11-19 DIAGNOSIS — N529 Male erectile dysfunction, unspecified: Secondary | ICD-10-CM | POA: Diagnosis not present

## 2020-11-21 DIAGNOSIS — E1151 Type 2 diabetes mellitus with diabetic peripheral angiopathy without gangrene: Secondary | ICD-10-CM | POA: Diagnosis not present

## 2020-11-21 DIAGNOSIS — I4891 Unspecified atrial fibrillation: Secondary | ICD-10-CM | POA: Diagnosis not present

## 2020-11-21 DIAGNOSIS — E1122 Type 2 diabetes mellitus with diabetic chronic kidney disease: Secondary | ICD-10-CM | POA: Diagnosis not present

## 2020-11-21 DIAGNOSIS — I130 Hypertensive heart and chronic kidney disease with heart failure and stage 1 through stage 4 chronic kidney disease, or unspecified chronic kidney disease: Secondary | ICD-10-CM | POA: Diagnosis not present

## 2020-11-21 DIAGNOSIS — I420 Dilated cardiomyopathy: Secondary | ICD-10-CM | POA: Diagnosis not present

## 2020-11-21 DIAGNOSIS — I471 Supraventricular tachycardia: Secondary | ICD-10-CM | POA: Diagnosis not present

## 2020-11-21 DIAGNOSIS — N183 Chronic kidney disease, stage 3 unspecified: Secondary | ICD-10-CM | POA: Diagnosis not present

## 2020-11-21 DIAGNOSIS — I272 Pulmonary hypertension, unspecified: Secondary | ICD-10-CM | POA: Diagnosis not present

## 2020-11-21 DIAGNOSIS — I5023 Acute on chronic systolic (congestive) heart failure: Secondary | ICD-10-CM | POA: Diagnosis not present

## 2020-11-24 DIAGNOSIS — I5023 Acute on chronic systolic (congestive) heart failure: Secondary | ICD-10-CM | POA: Diagnosis not present

## 2020-11-24 DIAGNOSIS — N1831 Chronic kidney disease, stage 3a: Secondary | ICD-10-CM | POA: Diagnosis not present

## 2020-11-24 DIAGNOSIS — I4891 Unspecified atrial fibrillation: Secondary | ICD-10-CM | POA: Diagnosis not present

## 2020-11-24 DIAGNOSIS — E1151 Type 2 diabetes mellitus with diabetic peripheral angiopathy without gangrene: Secondary | ICD-10-CM | POA: Diagnosis not present

## 2020-11-24 DIAGNOSIS — I429 Cardiomyopathy, unspecified: Secondary | ICD-10-CM | POA: Diagnosis not present

## 2020-11-24 DIAGNOSIS — N529 Male erectile dysfunction, unspecified: Secondary | ICD-10-CM | POA: Diagnosis not present

## 2020-11-24 DIAGNOSIS — I272 Pulmonary hypertension, unspecified: Secondary | ICD-10-CM | POA: Diagnosis not present

## 2020-11-24 DIAGNOSIS — E1122 Type 2 diabetes mellitus with diabetic chronic kidney disease: Secondary | ICD-10-CM | POA: Diagnosis not present

## 2020-11-24 DIAGNOSIS — I482 Chronic atrial fibrillation, unspecified: Secondary | ICD-10-CM | POA: Diagnosis not present

## 2020-11-24 DIAGNOSIS — J309 Allergic rhinitis, unspecified: Secondary | ICD-10-CM | POA: Diagnosis not present

## 2020-11-24 DIAGNOSIS — N183 Chronic kidney disease, stage 3 unspecified: Secondary | ICD-10-CM | POA: Diagnosis not present

## 2020-11-24 DIAGNOSIS — M25552 Pain in left hip: Secondary | ICD-10-CM | POA: Diagnosis not present

## 2020-11-24 DIAGNOSIS — N1832 Chronic kidney disease, stage 3b: Secondary | ICD-10-CM | POA: Diagnosis not present

## 2020-11-24 DIAGNOSIS — I420 Dilated cardiomyopathy: Secondary | ICD-10-CM | POA: Diagnosis not present

## 2020-11-24 DIAGNOSIS — I471 Supraventricular tachycardia: Secondary | ICD-10-CM | POA: Diagnosis not present

## 2020-11-24 DIAGNOSIS — I509 Heart failure, unspecified: Secondary | ICD-10-CM | POA: Diagnosis not present

## 2020-11-24 DIAGNOSIS — I130 Hypertensive heart and chronic kidney disease with heart failure and stage 1 through stage 4 chronic kidney disease, or unspecified chronic kidney disease: Secondary | ICD-10-CM | POA: Diagnosis not present

## 2020-11-24 DIAGNOSIS — K219 Gastro-esophageal reflux disease without esophagitis: Secondary | ICD-10-CM | POA: Diagnosis not present

## 2020-11-24 LAB — CUP PACEART INCLINIC DEVICE CHECK
Date Time Interrogation Session: 20220203153000
Implantable Lead Implant Date: 20170922
Implantable Lead Implant Date: 20170922
Implantable Lead Location: 753859
Implantable Lead Location: 753860
Implantable Lead Model: 5076
Implantable Pulse Generator Implant Date: 20170922

## 2020-11-25 ENCOUNTER — Ambulatory Visit (HOSPITAL_COMMUNITY)
Admission: RE | Admit: 2020-11-25 | Discharge: 2020-11-25 | Disposition: A | Discharge status: Home / Self Care | Payer: Medicare HMO | Source: Ambulatory Visit | Attending: Cardiology | Admitting: Cardiology

## 2020-11-25 ENCOUNTER — Other Ambulatory Visit: Payer: Self-pay

## 2020-11-25 VITALS — BP 120/64 | HR 73 | Wt 161.4 lb

## 2020-11-25 DIAGNOSIS — N183 Chronic kidney disease, stage 3 unspecified: Secondary | ICD-10-CM | POA: Diagnosis not present

## 2020-11-25 DIAGNOSIS — I739 Peripheral vascular disease, unspecified: Secondary | ICD-10-CM | POA: Diagnosis not present

## 2020-11-25 DIAGNOSIS — I493 Ventricular premature depolarization: Secondary | ICD-10-CM | POA: Diagnosis not present

## 2020-11-25 DIAGNOSIS — Z597 Insufficient social insurance and welfare support: Secondary | ICD-10-CM | POA: Insufficient documentation

## 2020-11-25 DIAGNOSIS — I5022 Chronic systolic (congestive) heart failure: Secondary | ICD-10-CM

## 2020-11-25 DIAGNOSIS — Z8679 Personal history of other diseases of the circulatory system: Secondary | ICD-10-CM | POA: Diagnosis not present

## 2020-11-25 DIAGNOSIS — Z95810 Presence of automatic (implantable) cardiac defibrillator: Secondary | ICD-10-CM | POA: Insufficient documentation

## 2020-11-25 DIAGNOSIS — I110 Hypertensive heart disease with heart failure: Secondary | ICD-10-CM | POA: Insufficient documentation

## 2020-11-25 DIAGNOSIS — Z79899 Other long term (current) drug therapy: Secondary | ICD-10-CM | POA: Insufficient documentation

## 2020-11-25 DIAGNOSIS — Z7984 Long term (current) use of oral hypoglycemic drugs: Secondary | ICD-10-CM | POA: Diagnosis not present

## 2020-11-25 DIAGNOSIS — Z7901 Long term (current) use of anticoagulants: Secondary | ICD-10-CM | POA: Insufficient documentation

## 2020-11-25 DIAGNOSIS — Z86718 Personal history of other venous thrombosis and embolism: Secondary | ICD-10-CM | POA: Insufficient documentation

## 2020-11-25 DIAGNOSIS — I480 Paroxysmal atrial fibrillation: Secondary | ICD-10-CM | POA: Insufficient documentation

## 2020-11-25 DIAGNOSIS — I5082 Biventricular heart failure: Secondary | ICD-10-CM | POA: Insufficient documentation

## 2020-11-25 DIAGNOSIS — Z8673 Personal history of transient ischemic attack (TIA), and cerebral infarction without residual deficits: Secondary | ICD-10-CM | POA: Insufficient documentation

## 2020-11-25 DIAGNOSIS — Z733 Stress, not elsewhere classified: Secondary | ICD-10-CM | POA: Diagnosis not present

## 2020-11-25 DIAGNOSIS — Z952 Presence of prosthetic heart valve: Secondary | ICD-10-CM | POA: Insufficient documentation

## 2020-11-25 MED ORDER — SPIRONOLACTONE 25 MG PO TABS: 12.5000 mg | ORAL_TABLET | Freq: Every day | ORAL | 5 refills | Status: DC

## 2020-11-25 MED ORDER — METOPROLOL SUCCINATE ER 25 MG PO TB24: 25.0000 mg | ORAL_TABLET | Freq: Every day | ORAL | 6 refills | Status: DC

## 2020-11-25 NOTE — Patient Instructions (Signed)
It was a pleasure seeing you today!  MEDICATIONS: -We are changing your medications today -Start spironolactone 12.5 mg (1/2 tablet) daily -Call if you have questions about your medications.  LABS: -Repeat labs in 1 week locally  NEXT APPOINTMENT: Return to clinic in 2 months with Dr. Aundra Dubin.  In general, to take care of your heart failure: -Limit your fluid intake to 2 Liters (half-gallon) per day.   -Limit your salt intake to ideally 2-3 grams (2000-3000 mg) per day. -Weigh yourself daily and record, and bring that "weight diary" to your next appointment.  (Weight gain of 2-3 pounds in 1 day typically means fluid weight.) -The medications for your heart are to help your heart and help you live longer.   -Please contact us before stopping any of your heart medications.  Call the clinic at [phone] with questions or to reschedule future appointments.

## 2020-11-26 ENCOUNTER — Telehealth (HOSPITAL_COMMUNITY): Payer: Self-pay | Admitting: Pharmacy Technician

## 2020-11-26 NOTE — Telephone Encounter (Signed)
Sent [employer] application in via fax.

## 2020-12-01 ENCOUNTER — Telehealth (HOSPITAL_COMMUNITY): Payer: Self-pay | Admitting: Licensed Clinical Social Worker

## 2020-12-01 ENCOUNTER — Encounter (HOSPITAL_COMMUNITY): Payer: Medicare HMO

## 2020-12-01 NOTE — Telephone Encounter (Signed)
Pharm Tech/Patient Advocated consulted CSW to assist with Extra Help application which [employer] is requiring before providing the pt with assistance.  CSW called pt and was able to complete application over the phone.  Pt should receive letter with determination within next 3-4 weeks- pt informed to call Patient Advocate once letter is received so we could move forward from there- pt expressed understanding  Will continue to follow and assist as needed  Jorge Ny, [place] Worker [facility] Desk#: [phone] Cell#: [phone]

## 2020-12-01 NOTE — Telephone Encounter (Signed)
Advanced Heart Failure Patient Advocate Encounter   [employer] sent documentation stating that they would like for the patient to apply for and be denied for LIS in order to qualify for assistance at this time.   Donzetta Starch, (CSW) a message requesting assistance with applying for LIS.

## 2020-12-02 ENCOUNTER — Other Ambulatory Visit: Payer: Self-pay | Admitting: Cardiology

## 2020-12-02 DIAGNOSIS — I5022 Chronic systolic (congestive) heart failure: Secondary | ICD-10-CM | POA: Diagnosis not present

## 2020-12-03 LAB — BASIC METABOLIC PANEL (7)
BUN/Creatinine Ratio: 18 (ref 10–24)
BUN: 30 mg/dL — ABNORMAL HIGH (ref 8–27)
CO2: 19 mmol/L — ABNORMAL LOW (ref 20–29)
Chloride: 105 mmol/L (ref 96–106)
Creatinine, Ser: 1.7 mg/dL — ABNORMAL HIGH (ref 0.76–1.27)
Glucose: 110 mg/dL — ABNORMAL HIGH (ref 65–99)
Potassium: 4.1 mmol/L (ref 3.5–5.2)
Sodium: 142 mmol/L (ref 134–144)
eGFR: 41 mL/min/{1.73_m2} — ABNORMAL LOW (ref >59)

## 2020-12-08 ENCOUNTER — Encounter (HOSPITAL_COMMUNITY): Payer: Medicare HMO

## 2020-12-15 ENCOUNTER — Ambulatory Visit (INDEPENDENT_AMBULATORY_CARE_PROVIDER_SITE_OTHER): Payer: Medicare HMO

## 2020-12-15 ENCOUNTER — Encounter (HOSPITAL_COMMUNITY): Payer: Medicare HMO

## 2020-12-15 DIAGNOSIS — I5022 Chronic systolic (congestive) heart failure: Secondary | ICD-10-CM | POA: Diagnosis not present

## 2020-12-15 LAB — CUP PACEART REMOTE DEVICE CHECK
Battery Remaining Longevity: 55 mo
Battery Voltage: 2.98 V
Brady Statistic AP VP Percent: 0.97 %
Brady Statistic AP VS Percent: 67.98 %
Brady Statistic AS VP Percent: 0.05 %
Brady Statistic AS VS Percent: 31.01 %
Brady Statistic RA Percent Paced: 68.93 %
Brady Statistic RV Percent Paced: 1.01 %
Date Time Interrogation Session: 20220328012404
HighPow Impedance: 71 Ohm
Implantable Lead Implant Date: 20170922
Implantable Lead Implant Date: 20170922
Implantable Lead Location: 753859
Implantable Lead Location: 753860
Implantable Lead Model: 5076
Implantable Pulse Generator Implant Date: 20170922
Lead Channel Impedance Value: 361 Ohm
Lead Channel Impedance Value: 418 Ohm
Lead Channel Impedance Value: 418 Ohm
Lead Channel Pacing Threshold Amplitude: 0.625 V
Lead Channel Pacing Threshold Amplitude: 0.75 V
Lead Channel Pacing Threshold Pulse Width: 0.4 ms
Lead Channel Pacing Threshold Pulse Width: 0.4 ms
Lead Channel Sensing Intrinsic Amplitude: 10.625 mV
Lead Channel Sensing Intrinsic Amplitude: 10.625 mV
Lead Channel Sensing Intrinsic Amplitude: 2.875 mV
Lead Channel Sensing Intrinsic Amplitude: 2.875 mV
Lead Channel Setting Pacing Amplitude: 2 V
Lead Channel Setting Pacing Amplitude: 2.5 V
Lead Channel Setting Pacing Pulse Width: 0.4 ms
Lead Channel Setting Sensing Sensitivity: 0.3 mV

## 2020-12-16 ENCOUNTER — Ambulatory Visit (INDEPENDENT_AMBULATORY_CARE_PROVIDER_SITE_OTHER): Payer: Medicare HMO

## 2020-12-16 DIAGNOSIS — N1831 Chronic kidney disease, stage 3a: Secondary | ICD-10-CM | POA: Diagnosis not present

## 2020-12-16 DIAGNOSIS — Z95810 Presence of automatic (implantable) cardiac defibrillator: Secondary | ICD-10-CM

## 2020-12-16 DIAGNOSIS — I509 Heart failure, unspecified: Secondary | ICD-10-CM | POA: Diagnosis not present

## 2020-12-16 DIAGNOSIS — M25552 Pain in left hip: Secondary | ICD-10-CM | POA: Diagnosis not present

## 2020-12-16 DIAGNOSIS — I5022 Chronic systolic (congestive) heart failure: Secondary | ICD-10-CM

## 2020-12-16 DIAGNOSIS — J309 Allergic rhinitis, unspecified: Secondary | ICD-10-CM | POA: Diagnosis not present

## 2020-12-16 DIAGNOSIS — I429 Cardiomyopathy, unspecified: Secondary | ICD-10-CM | POA: Diagnosis not present

## 2020-12-16 DIAGNOSIS — Z7901 Long term (current) use of anticoagulants: Secondary | ICD-10-CM | POA: Diagnosis not present

## 2020-12-16 DIAGNOSIS — I482 Chronic atrial fibrillation, unspecified: Secondary | ICD-10-CM | POA: Diagnosis not present

## 2020-12-16 DIAGNOSIS — N529 Male erectile dysfunction, unspecified: Secondary | ICD-10-CM | POA: Diagnosis not present

## 2020-12-16 DIAGNOSIS — N1832 Chronic kidney disease, stage 3b: Secondary | ICD-10-CM | POA: Diagnosis not present

## 2020-12-17 NOTE — Progress Notes (Signed)
EPIC Encounter for ICM Monitoring  Patient Name: Ryan [name] is a 75 y.o. male Date: 12/17/2020 Primary Care Physican: [name], MD Primary Cardiologist:Smith/McLean Electrophysiologist:Camnitz 12/16/2020 Weight:150.8lbs   VT-NS (>4 beats, >200 bpm) 6   Heart Failure questions reviewed. Pt is feeling fine and denies any fluid symptoms.          Optivol thoracic impedancesuggesting normal fluid levels.  Prescribed:Spironolactone 25 mg Take 0.5 tablets (12.5 mg total) by mouth daily.  Labs: 11/07/2020 Creatinine 1.51, BUN 23, Potassium 4.4, Sodium 139 10/14/2020 Creatinine 1.50, BUN 22, Potassium 4.0, Sodium 139  10/01/2020 Creatinine 1.88, BUN 37, Potassium 4.6, Sodium 140, GFR 34-39 09/22/2020 Creatinine 2.20, BUN 42, Potassium 5.1, Sodium 138, GFR 28--32  A complete set of results can be found in Results Review.  Recommendations:No changes and encouraged to call if experiencing any fluid symptoms.  Follow-up plan: ICM clinic phone appointment on5/10/2020. 91 day device clinic remote transmission 03/16/2021.   EP/Cardiology Office Visits: 01/22/2021 with Dr Aundra Dubin.Recall 04/21/2021 with Dr [name]  Copy of ICM check sent to [facility].   3 month ICM trend: 12/16/2020.    1 Year ICM trend:       Rosalene Billings, RN 12/17/2020 4:55 PM

## 2020-12-19 ENCOUNTER — Other Ambulatory Visit (HOSPITAL_COMMUNITY): Payer: Self-pay | Admitting: Cardiology

## 2020-12-22 DIAGNOSIS — Z7901 Long term (current) use of anticoagulants: Secondary | ICD-10-CM | POA: Diagnosis not present

## 2020-12-22 DIAGNOSIS — M25552 Pain in left hip: Secondary | ICD-10-CM | POA: Diagnosis not present

## 2020-12-22 DIAGNOSIS — I482 Chronic atrial fibrillation, unspecified: Secondary | ICD-10-CM | POA: Diagnosis not present

## 2020-12-22 DIAGNOSIS — B351 Tinea unguium: Secondary | ICD-10-CM | POA: Diagnosis not present

## 2020-12-22 DIAGNOSIS — I509 Heart failure, unspecified: Secondary | ICD-10-CM | POA: Diagnosis not present

## 2020-12-22 DIAGNOSIS — N529 Male erectile dysfunction, unspecified: Secondary | ICD-10-CM | POA: Diagnosis not present

## 2020-12-22 DIAGNOSIS — N1832 Chronic kidney disease, stage 3b: Secondary | ICD-10-CM | POA: Diagnosis not present

## 2020-12-22 DIAGNOSIS — J309 Allergic rhinitis, unspecified: Secondary | ICD-10-CM | POA: Diagnosis not present

## 2020-12-22 DIAGNOSIS — I429 Cardiomyopathy, unspecified: Secondary | ICD-10-CM | POA: Diagnosis not present

## 2020-12-29 NOTE — Progress Notes (Signed)
Remote ICD transmission.   

## 2021-01-01 ENCOUNTER — Telehealth (HOSPITAL_COMMUNITY): Payer: Self-pay | Admitting: Licensed Clinical Social Worker

## 2021-01-01 NOTE — Telephone Encounter (Signed)
CSW called pt to check in regarding Extra Help application status.  Pt reports he got a letter from [facility] stating that he was denied for the program.  CSW informed pt he needs to bring that denial to the clinic so we can submit to [facility] to get him assistance with his Jardiance through that program- pt expressed understanding and will plan to bring in to clinic.  No further needs at this time  Jorge Ny, [place] [facility] Desk#: [phone] Cell#: [phone]

## 2021-01-05 DIAGNOSIS — I482 Chronic atrial fibrillation, unspecified: Secondary | ICD-10-CM | POA: Diagnosis not present

## 2021-01-05 DIAGNOSIS — N1831 Chronic kidney disease, stage 3a: Secondary | ICD-10-CM | POA: Diagnosis not present

## 2021-01-05 DIAGNOSIS — Z7901 Long term (current) use of anticoagulants: Secondary | ICD-10-CM | POA: Diagnosis not present

## 2021-01-05 DIAGNOSIS — J309 Allergic rhinitis, unspecified: Secondary | ICD-10-CM | POA: Diagnosis not present

## 2021-01-05 DIAGNOSIS — I429 Cardiomyopathy, unspecified: Secondary | ICD-10-CM | POA: Diagnosis not present

## 2021-01-05 DIAGNOSIS — N1832 Chronic kidney disease, stage 3b: Secondary | ICD-10-CM | POA: Diagnosis not present

## 2021-01-05 DIAGNOSIS — I509 Heart failure, unspecified: Secondary | ICD-10-CM | POA: Diagnosis not present

## 2021-01-05 DIAGNOSIS — N529 Male erectile dysfunction, unspecified: Secondary | ICD-10-CM | POA: Diagnosis not present

## 2021-01-05 DIAGNOSIS — K8020 Calculus of gallbladder without cholecystitis without obstruction: Secondary | ICD-10-CM | POA: Diagnosis not present

## 2021-01-12 ENCOUNTER — Encounter (HOSPITAL_COMMUNITY): Payer: Medicare HMO | Admitting: Cardiology

## 2021-01-12 DIAGNOSIS — N529 Male erectile dysfunction, unspecified: Secondary | ICD-10-CM | POA: Diagnosis not present

## 2021-01-12 DIAGNOSIS — I509 Heart failure, unspecified: Secondary | ICD-10-CM | POA: Diagnosis not present

## 2021-01-12 DIAGNOSIS — B351 Tinea unguium: Secondary | ICD-10-CM | POA: Diagnosis not present

## 2021-01-12 DIAGNOSIS — I429 Cardiomyopathy, unspecified: Secondary | ICD-10-CM | POA: Diagnosis not present

## 2021-01-12 DIAGNOSIS — Z7901 Long term (current) use of anticoagulants: Secondary | ICD-10-CM | POA: Diagnosis not present

## 2021-01-12 DIAGNOSIS — I482 Chronic atrial fibrillation, unspecified: Secondary | ICD-10-CM | POA: Diagnosis not present

## 2021-01-12 DIAGNOSIS — J309 Allergic rhinitis, unspecified: Secondary | ICD-10-CM | POA: Diagnosis not present

## 2021-01-12 DIAGNOSIS — N1832 Chronic kidney disease, stage 3b: Secondary | ICD-10-CM | POA: Diagnosis not present

## 2021-01-12 DIAGNOSIS — E11621 Type 2 diabetes mellitus with foot ulcer: Secondary | ICD-10-CM | POA: Diagnosis not present

## 2021-01-12 DIAGNOSIS — L97509 Non-pressure chronic ulcer of other part of unspecified foot with unspecified severity: Secondary | ICD-10-CM | POA: Diagnosis not present

## 2021-01-12 DIAGNOSIS — I739 Peripheral vascular disease, unspecified: Secondary | ICD-10-CM | POA: Diagnosis not present

## 2021-01-12 DIAGNOSIS — L97514 Non-pressure chronic ulcer of other part of right foot with necrosis of bone: Secondary | ICD-10-CM | POA: Diagnosis not present

## 2021-01-13 ENCOUNTER — Telehealth (HOSPITAL_COMMUNITY): Payer: Self-pay | Admitting: Pharmacy Technician

## 2021-01-13 ENCOUNTER — Other Ambulatory Visit (HOSPITAL_COMMUNITY): Payer: Self-pay | Admitting: *Deleted

## 2021-01-13 ENCOUNTER — Other Ambulatory Visit (HOSPITAL_COMMUNITY): Payer: Self-pay | Admitting: Cardiology

## 2021-01-13 ENCOUNTER — Other Ambulatory Visit: Payer: Self-pay

## 2021-01-13 ENCOUNTER — Ambulatory Visit (HOSPITAL_COMMUNITY): Payer: Medicare HMO | Attending: Cardiology

## 2021-01-13 DIAGNOSIS — I5022 Chronic systolic (congestive) heart failure: Secondary | ICD-10-CM | POA: Diagnosis not present

## 2021-01-13 NOTE — Telephone Encounter (Signed)
Sent in LIS denial via fax to [facility].  Will follow up.

## 2021-01-13 NOTE — Progress Notes (Signed)
cpx order placed

## 2021-01-14 NOTE — Telephone Encounter (Signed)
Advanced Heart Failure Patient Advocate Encounter   Patient was approved to receive Jardiance from [facility]  Effective dates: 01/14/21 through 09/19/21  Called and spoke with the patient.   Charlann Boxer, CPhT

## 2021-01-19 ENCOUNTER — Ambulatory Visit (INDEPENDENT_AMBULATORY_CARE_PROVIDER_SITE_OTHER): Payer: Medicare HMO

## 2021-01-19 DIAGNOSIS — Z95810 Presence of automatic (implantable) cardiac defibrillator: Secondary | ICD-10-CM

## 2021-01-19 DIAGNOSIS — I5022 Chronic systolic (congestive) heart failure: Secondary | ICD-10-CM

## 2021-01-20 DIAGNOSIS — L97519 Non-pressure chronic ulcer of other part of right foot with unspecified severity: Secondary | ICD-10-CM | POA: Diagnosis not present

## 2021-01-20 DIAGNOSIS — J449 Chronic obstructive pulmonary disease, unspecified: Secondary | ICD-10-CM | POA: Diagnosis not present

## 2021-01-20 DIAGNOSIS — I739 Peripheral vascular disease, unspecified: Secondary | ICD-10-CM | POA: Diagnosis not present

## 2021-01-20 DIAGNOSIS — L97502 Non-pressure chronic ulcer of other part of unspecified foot with fat layer exposed: Secondary | ICD-10-CM | POA: Diagnosis not present

## 2021-01-20 DIAGNOSIS — E11621 Type 2 diabetes mellitus with foot ulcer: Secondary | ICD-10-CM | POA: Diagnosis not present

## 2021-01-20 DIAGNOSIS — L97514 Non-pressure chronic ulcer of other part of right foot with necrosis of bone: Secondary | ICD-10-CM | POA: Diagnosis not present

## 2021-01-20 DIAGNOSIS — L97512 Non-pressure chronic ulcer of other part of right foot with fat layer exposed: Secondary | ICD-10-CM | POA: Diagnosis not present

## 2021-01-21 NOTE — Progress Notes (Signed)
EPIC Encounter for ICM Monitoring  Patient Name: Ryan [name] is a 75 y.o. male Date: 01/21/2021 Primary Care Physican: [name], MD Primary Cardiologist:Smith/McLean Electrophysiologist:Camnitz 01/21/2021 Weight:153.8lbs   VT-NS (>4 beats, >200 bpm)    3   Spoke with patient and reports feeling well at this time.  Denies fluid symptoms.          Optivol thoracic impedancesuggestingnormal fluid levels.  Prescribed:Spironolactone 25 mg Take 0.5 tablets (12.5 mg total) by mouth daily.  Labs: 11/07/2020 Creatinine1.51Evlyn Kanner, Potassium4.4, [id number] 10/14/2020 Creatinine1.50, BUN22, Potassium4.0, [id number]  10/01/2020 Creatinine 1.88, BUN 37, Potassium 4.6, Sodium 140, GFR 34-39 09/22/2020 Creatinine2.20, BUN42, Potassium5.1, Sodium138, GFR28--32 A complete set of results can be found in Results Review.  Recommendations: No changes and encouraged to call if experiencing any fluid symptoms.  Follow-up plan: ICM clinic phone appointment on6/13/2022. 91 day device clinic remote transmission 03/16/2021.   EP/Cardiology Office Visits:01/22/2021 with Dr Aundra Dubin.Recall 04/21/2021 with Dr [name]  Copy of ICM check sent to [facility].   3 month ICM trend: 01/19/2021.    1 Year ICM trend:       Rosalene Billings, RN 01/21/2021 9:35 AM

## 2021-01-22 ENCOUNTER — Ambulatory Visit (HOSPITAL_COMMUNITY)
Admission: RE | Admit: 2021-01-22 | Discharge: 2021-01-22 | Disposition: A | Discharge status: Home / Self Care | Payer: Medicare HMO | Source: Ambulatory Visit | Attending: Cardiology | Admitting: Cardiology

## 2021-01-22 ENCOUNTER — Telehealth: Payer: Self-pay

## 2021-01-22 ENCOUNTER — Other Ambulatory Visit (HOSPITAL_COMMUNITY): Payer: Self-pay

## 2021-01-22 ENCOUNTER — Other Ambulatory Visit: Payer: Self-pay

## 2021-01-22 ENCOUNTER — Encounter (HOSPITAL_COMMUNITY): Payer: Self-pay | Admitting: Cardiology

## 2021-01-22 VITALS — BP 122/60 | HR 64 | Wt 161.6 lb

## 2021-01-22 DIAGNOSIS — I5082 Biventricular heart failure: Secondary | ICD-10-CM | POA: Diagnosis not present

## 2021-01-22 DIAGNOSIS — E11621 Type 2 diabetes mellitus with foot ulcer: Secondary | ICD-10-CM | POA: Diagnosis not present

## 2021-01-22 DIAGNOSIS — I130 Hypertensive heart and chronic kidney disease with heart failure and stage 1 through stage 4 chronic kidney disease, or unspecified chronic kidney disease: Secondary | ICD-10-CM | POA: Insufficient documentation

## 2021-01-22 DIAGNOSIS — Z7984 Long term (current) use of oral hypoglycemic drugs: Secondary | ICD-10-CM | POA: Insufficient documentation

## 2021-01-22 DIAGNOSIS — I480 Paroxysmal atrial fibrillation: Secondary | ICD-10-CM | POA: Diagnosis not present

## 2021-01-22 DIAGNOSIS — Z8673 Personal history of transient ischemic attack (TIA), and cerebral infarction without residual deficits: Secondary | ICD-10-CM | POA: Insufficient documentation

## 2021-01-22 DIAGNOSIS — E1122 Type 2 diabetes mellitus with diabetic chronic kidney disease: Secondary | ICD-10-CM | POA: Insufficient documentation

## 2021-01-22 DIAGNOSIS — I471 Supraventricular tachycardia: Secondary | ICD-10-CM

## 2021-01-22 DIAGNOSIS — L97519 Non-pressure chronic ulcer of other part of right foot with unspecified severity: Secondary | ICD-10-CM | POA: Diagnosis not present

## 2021-01-22 DIAGNOSIS — I493 Ventricular premature depolarization: Secondary | ICD-10-CM | POA: Diagnosis not present

## 2021-01-22 DIAGNOSIS — Z953 Presence of xenogenic heart valve: Secondary | ICD-10-CM | POA: Diagnosis not present

## 2021-01-22 DIAGNOSIS — Z79899 Other long term (current) drug therapy: Secondary | ICD-10-CM | POA: Insufficient documentation

## 2021-01-22 DIAGNOSIS — I5042 Chronic combined systolic (congestive) and diastolic (congestive) heart failure: Secondary | ICD-10-CM

## 2021-01-22 DIAGNOSIS — N183 Chronic kidney disease, stage 3 unspecified: Secondary | ICD-10-CM | POA: Insufficient documentation

## 2021-01-22 DIAGNOSIS — Z8249 Family history of ischemic heart disease and other diseases of the circulatory system: Secondary | ICD-10-CM | POA: Diagnosis not present

## 2021-01-22 DIAGNOSIS — I5022 Chronic systolic (congestive) heart failure: Secondary | ICD-10-CM | POA: Insufficient documentation

## 2021-01-22 DIAGNOSIS — Z7901 Long term (current) use of anticoagulants: Secondary | ICD-10-CM | POA: Diagnosis not present

## 2021-01-22 LAB — COMPREHENSIVE METABOLIC PANEL
ALT: 43 U/L (ref 0–44)
AST: 35 U/L (ref 15–41)
Albumin: 2.9 g/dL — ABNORMAL LOW (ref 3.5–5.0)
Alkaline Phosphatase: 65 U/L (ref 38–126)
Anion gap: 5 (ref 5–15)
BUN: 40 mg/dL — ABNORMAL HIGH (ref 8–23)
CO2: 23 mmol/L (ref 22–32)
Calcium: 8.7 mg/dL — ABNORMAL LOW (ref 8.9–10.3)
Chloride: 109 mmol/L (ref 98–111)
Creatinine, Ser: 1.59 mg/dL — ABNORMAL HIGH (ref 0.61–1.24)
GFR, Estimated: 44 mL/min — ABNORMAL LOW (ref >60)
Glucose, Bld: 91 mg/dL (ref 70–99)
Potassium: 4.8 mmol/L (ref 3.5–5.1)
Sodium: 137 mmol/L (ref 135–145)
Total Bilirubin: 0.5 mg/dL (ref 0.3–1.2)
Total Protein: 6.2 g/dL — ABNORMAL LOW (ref 6.5–8.1)

## 2021-01-22 LAB — CBC
HCT: 39.4 % (ref 39.0–52.0)
Hemoglobin: 12.1 g/dL — ABNORMAL LOW (ref 13.0–17.0)
MCH: 24 pg — ABNORMAL LOW (ref 26.0–34.0)
MCHC: 30.7 g/dL (ref 30.0–36.0)
MCV: 78.2 fL — ABNORMAL LOW (ref 80.0–100.0)
Platelets: 120 10*3/uL — ABNORMAL LOW (ref 150–400)
RBC: 5.04 MIL/uL (ref 4.22–5.81)
RDW: 18.7 % — ABNORMAL HIGH (ref 11.5–15.5)
WBC: 5.3 10*3/uL (ref 4.0–10.5)
nRBC: 0 % (ref 0.0–0.2)

## 2021-01-22 LAB — LIPID PANEL
Cholesterol: 177 mg/dL (ref 0–200)
HDL: 61 mg/dL (ref >40)
LDL Cholesterol: 107 mg/dL — ABNORMAL HIGH (ref 0–99)
Total CHOL/HDL Ratio: 2.9 RATIO
Triglycerides: 46 mg/dL (ref <150)
VLDL: 9 mg/dL (ref 0–40)

## 2021-01-22 LAB — TSH: TSH: 2.743 u[IU]/mL (ref 0.350–4.500)

## 2021-01-22 MED ORDER — ENTRESTO 24-26 MG PO TABS: 1.0000 | ORAL_TABLET | Freq: Two times a day (BID) | ORAL | 11 refills | Status: DC

## 2021-01-22 NOTE — H&P (View-Only) (Signed)
Advanced Heart Failure Clinic Note   PCP: [name], MD PCP-Cardiologist: [name], MD  EP: Dr. [name] HF Cardiologist: Dr. Aundra Dubin  Reason for Visit: F/u for Systolic Heart Failure  HPI: Ryan [name] is a 75 y.o. 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  Hehas a history of endocarditis that resulted in mitral valve replacement in 2014. Heart cath prior to that surgery showed widely patent coronaries and moderate to severe pulmonary hypertension.  He was admitted in 12/21 with incessant SVT and CHF.  Echo showed EF 25-30% with severe RV dysfunction.  He initially had poor response to IV diuretics w/ rising SCr. RHC showed evidence for severe biventricular failure with PCWP and RA pressure >30 and PAPi 0.8. Equalization of diastolic pressures concerning for restrictive physiology. Cardiac output preserved (CI 2.28 Fick, 2.33 thermo). Was transferred to CCU for lasix gtt and later required addition of milrinone to aid in diuresis. 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  Also had frequent PVCs treated w/ amiodarone. Of note, LHC was not pursued given abnormal renal function. He was diuresed and transitioned to po diuretics. SCr down to 1.74 day of d/c (down from peak of 2.4).   He returns for followup of CHF.  He has a right foot ulcer followed by podiatry.  He is walking with a cane though not very active due to foot ulcer.  He has occasional right calf claudication.  No chest pain.  No palpitations.  He gets short of breath with moderate exertion like making up his bed.  No dyspnea walking on flat ground.  No orthopnea/PND.  No lightheadedness.    Labs (3/22): K 4.1, creatinine 1.7  Medtronic device  interrogation: Stable thoracic impedance, no AF/VT.   ECG (personally reviewed): a-paced, prolonged AV interval, QRS narrow.   Review of systems complete and found to be negative unless listed in HPI.    PMH: 1. Type 2 diabetes. 2. HTN 3. PAD: H/o right popliteal occlusion.  4. SVT: Incessant, slow AVNRT.  Ablation in 12/21.  5. PVCs: Frequent, up to 20% of beats by prior monitoring.  6. Atrial fibrillation: Paroxysmal. Has h/o CVA.  7. CVA: Related to atrial fibrillation.   8. 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  9. CKD stage 3 10. H/o junctional rhythm 11. Chronic systolic CHF: Nonischemic cardiomyopathy.  - LHC (1/14): no significant CAD. - Echo (12/21): EF 25-30% with dyssynchrony, severely decreased RV systolic function, biatrial enlargement, s/p bioprosthetic mitral valve replacement with mean gradient 6 mmHg and no MR, moderate TR.  - RHC (12/21): mean RA 31, PA 57/33 mean 45, mean PCWP 36, PAPi 0.8, CI 2.28, PVR 1.9 WU.  - CPX (4/22): peak VO2 15.9, VE/VCO2 slope 59, RER 0.91 => submaximal, moderate HF limitation.    Current Outpatient Medications  Medication Sig Dispense Refill  . acetaminophen (TYLENOL) 325 MG tablet Take 650 mg by mouth every 6 (six) hours as needed for mild pain or headache.    Marland Kitchen amiodarone (PACERONE) 200 MG tablet Take 1 tablet (200 mg total) by mouth daily. 30 tablet 6  . atorvastatin (LIPITOR) 40 MG tablet Take 1 tablet (40 mg total) by mouth daily at 6 PM. 30 tablet 5  . empagliflozin (JARDIANCE)  10 MG TABS tablet Take 1 tablet (10 mg total) by mouth daily. 90 tablet 3  . gabapentin (NEURONTIN) 300 MG capsule Take 300 mg by mouth at bedtime.    Marland Kitchen HYDROcodone-acetaminophen (NORCO/VICODIN) 5-325 MG per tablet Take 1 tablet by mouth 2 (two) times daily as needed for moderate pain.     . metoprolol succinate (TOPROL-XL) 25 MG 24 hr tablet Take 1 tablet (25 mg total) by mouth daily. 30 tablet 6  . Multiple  Vitamins-Minerals (CENTRUM SILVER PO) Take 1 tablet by mouth 3 (three) times a week.    . pantoprazole (PROTONIX) 40 MG tablet Take 40 mg by mouth daily.     . sacubitril-valsartan (ENTRESTO) 24-26 MG Take 1 tablet by mouth 2 (two) times daily. 60 tablet 11  . spironolactone (ALDACTONE) 25 MG tablet Take 0.5 tablets (12.5 mg total) by mouth daily. 30 tablet 5  . tamsulosin (FLOMAX) 0.4 MG CAPS capsule Take 0.4 mg by mouth daily.     Marland Kitchen triamcinolone cream (KENALOG) 0.5 % Apply 1 application topically daily as needed (skin rash).     . warfarin (COUMADIN) 2.5 MG tablet Take 2.'5mg'$  ( 1 tablet ) Monday, wednesday and Friday  Other days take '5mg'$  (2 tablets) 44 tablet 1   No current facility-administered medications for this encounter.    Allergies  Allergen Reactions  . Geralyn Flash [Fish Allergy] Nausea And Vomiting      Social History   Socioeconomic History  . Marital status: Legally Separated    Spouse name: Not on file  . Number of children: 3  . Years of education: Not on file  . Highest education level: Not on file  Occupational History  . Not on file  Tobacco Use  . Smoking status: Never Smoker  . Smokeless tobacco: Never Used  Vaping Use  . Vaping Use: Never used  Substance and Sexual Activity  . Alcohol use: No    Alcohol/week: 0.0 standard drinks  . Drug use: No  . Sexual activity: Not on file  Other Topics Concern  . Not on file  Social History Narrative   Daughter lives with him.  Retired Advertising account planner   Social Determinants of Radio broadcast assistant Strain: Not on [employer] Insecurity: No [employer]  . Worried About Charity fundraiser in the Last Year: Never true  . Ran Out of Food in the Last Year: Never true  Transportation Needs: No Transportation Needs  . Lack of Transportation (Medical): No  . Lack of Transportation (Non-Medical): No  Physical Activity: Not on file  Stress: Not on file  Social Connections: Not on file  Intimate Partner  Violence: Not on file     Family History  Problem Relation Age of Onset  . Hypertension Mother   . Hypertension Father     Vitals:   01/22/21 1121  BP: 122/60  Pulse: 64  SpO2: 100%  Weight: 73.3 kg (161 lb 9.6 oz)   Wt Readings from Last 3 Encounters:  01/22/21 73.3 kg (161 lb 9.6 oz)  11/25/20 73.2 kg (161 lb 6.4 oz)  11/07/20 75.7 kg (166 lb 12.8 oz)   PHYSICAL EXAM: General: NAD Neck: No JVD, no thyromegaly or thyroid nodule.  Lungs: Clear to auscultation bilaterally with normal respiratory effort. CV: Nondisplaced PMI.  Heart regular S1/S2, no S3/S4, no murmur.  No peripheral edema.  No carotid bruit.  Unable to palpate pedal pulses.  Abdomen: Soft, nontender, no hepatosplenomegaly, no distention.  Skin: Intact  without lesions or rashes.  Neurologic: Alert and oriented x 3.  Psych: Normal affect. Extremities: No clubbing or cyanosis.  HEENT: Normal.   ASSESSMENT & PLAN: 1. Chronic Systolic CHF: Biventricular failure. Has MDT ICD. Echo 12/21 showed EF 20-25%, moderate RV enlargement with severely decreased RV systolic function, normally functioning bioprosthetic mitral valve with no significant MR and mean gradient 6, moderate TR, dilated IVC. Patient had normal EF in 12/13 pre-mitral valve replacement. 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 His last cath was in 1/14 pre-surgery, no significant CAD. On [place] in 12/21, evidence for severe biventricular failure with PCWP and RA pressure >30 and PAPi 0.8. Equalization of diastolic pressures concerning for restrictive physiology. Cardiac output preserved (CI 2.28 Fick, 2.33 thermo).  Moderate HF limitation on 4/22 CPX.It is possible that cardiomyopathy is at least in part tachycardia-mediated given near incessant slow AVNRT in 12/21. Also has history of frequent PVCs. Cannot rule out coronary disease, though no coronary angiography has  been done yet with elevated creatinine.  Symptoms are NYHA class II-III, he is not volume overloaded on exam.  - He does not need scheduled diuretics, lasix prn - Narrow QRS, not candidate for CRT.  - Stop losartan, start Entresto 24/26 bid. BMET today and again in 10 days.  - Continue Toprol XL 25 mg daily. - Continue Jardiance 10 mg daily. - Continue spironolactone 12.5 daily.  - With improvement in creatinine, I will arrange for LHC/RHC.  We need to see if CAD plays a role in his cardiomyopathy.  We discussed risks/benefits and he agrees to procedure.  With PAF and h/o CVA, he will need Lovenox bridge off  - I will arrange for repeat echo 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 S/P ablation 09/03/20. It is possible that incessant relatively slow AVNRT contributed to cardiomyopathy. He remains in NSR today.  - We need to keep in NSR if at all possible given concern for tachy-mediated CMP.  3. PVCs: History of very frequent PVCs. This may play a role in biventricular failure.  - Continue PO amio 200 mg daily, check LFTs and TSH today.  Will need regular eye exam.    4. PAD: h/o right popliteal occlusion.  - Continue atorvastatin.  -  Needs reassessment of LE circulation given right foot ulceration.  I will refer back to [facility],  5. Atrial fibrillation: Paroxysmal.  Has history of CVA.  NSR today.  - On warfarin. INRs followed by PCP in [place]. 6. Bioprosthetic mitral valve: Stable function on echo in 12/21.  7. CKD stage 3: BMET today.    Echo + Followup in 1 month.   Loralie Champagne, MD 01/22/21

## 2021-01-22 NOTE — Patient Instructions (Signed)
EKG done today.  Labs done today. We will contact you only if your labs are abnormal.  STOP taking Losartan  START Entresto 24-'26mg'$  (1 tablet) by mouth 2 times daily.  No other medication changes were made. Please continue all current medications as prescribed.  [facility] will be in contact with you all about an appointment. If you have not been contacted by them by Tuesday 5/10 please contact our office.   Your physician recommends that you schedule a follow-up appointment for a Lab appointment at [place] 01/26/21 and a 1 month follow up with Dr. Aundra Dubin with an echo prior to your exam.  Your physician has requested that you have an echocardiogram. Echocardiography is a painless test that uses sound waves to create images of your heart. It provides your doctor with information about the size and shape of your heart and how well your heart's chambers and valves are working. This procedure takes approximately one hour. There are no restrictions for this procedure.  If you have any questions or concerns before your next appointment please send [place] a message through [place] or call our office at [phone].    TO LEAVE A MESSAGE FOR THE NURSE SELECT OPTION 2, PLEASE LEAVE A MESSAGE INCLUDING: . YOUR NAME . DATE OF BIRTH . CALL BACK NUMBER . REASON FOR CALL**this is important as we prioritize the call backs  YOU WILL RECEIVE A CALL BACK THE SAME DAY AS LONG AS YOU CALL BEFORE 4:00 PM   Do the following things EVERYDAY: 1) Weigh yourself in the morning before breakfast. Write it down and keep it in a log. 2) Take your medicines as prescribed 3) Eat low salt foods--Limit salt (sodium) to 2000 mg per day.  4) Stay as active as you can everyday 5) Limit all fluids for the day to less than 2 liters   At the [facility], you and your health needs are our priority. As part of our continuing mission to provide you with exceptional heart care, we have created designated  Provider Care Teams. These Care Teams include your primary Cardiologist (physician) and Advanced Practice Providers (APPs- Physician Assistants and Nurse Practitioners) who all work together to provide you with the care you need, when you need it.   You may see any of the following providers on your designated Care Team at your next follow up: Marland Kitchen Dr Glori Bickers . Dr Loralie Champagne . Darrick Grinder, NP . Lyda Jester, PA . Audry Riles, PharmD   Please be sure to bring in all your medications bottles to every appointment.    You are scheduled for a Cardiac Catheterization on              May            with Dr. Loralie Champagne.  1. Please arrive at the [facility] (Main Entrance A) at [facility]: [address] [place], [place]  [address] at                   (This time is two hours before your procedure to ensure your preparation). Free valet parking service is available.   Special note: Every effort is made to have your procedure done on time. Please understand that emergencies sometimes delay scheduled procedures.  2. Diet: Do not eat solid foods after midnight.  The patient may have clear liquids until 5am upon the day of the procedure.  3. YOU MUST HAVE PRE PROCEDURE COVID TESTING  DONE AT OUR DRIVE-THRU TESTING SITE LOCATED AT [place], [place] [address] BETWEEN THE HOURS OF 8AM-2PM. YOU MUST SELF QUARANTINE UNTIL YOUR PROCEDURE.   4. Medication instructions in preparation for your procedure:  [facility] will be in contact with you regarding a Lovenox bridge.  -STOP TAKING YOUR WARFARIN 3 DAYS PRIOR TO YOUR PROCEDURE  -DO NOT TAKE YOUR SPIRONOLACTONE THE MORNING OF YOUR PROCEDURE.  On the morning of your procedure, take any morning medicines NOT listed above.  You may use sips of water.  5. Plan for one night stay--bring personal belongings. 6. Bring a current list of your medications and current insurance cards. 7. You MUST have a  responsible person to drive you home. 8. Someone MUST be with you the first 24 hours after you arrive home or your discharge will be delayed. 9. Please wear clothes that are easy to get on and off and wear slip-on shoes.  Thank you for allowing [place] to care for you!   -- [facility] [facility]

## 2021-01-22 NOTE — Progress Notes (Signed)
Advanced Heart Failure Clinic Note   PCP: [name], MD PCP-Cardiologist: [name], MD  EP: Dr. [name] HF Cardiologist: Dr. Aundra Dubin  Reason for Visit: F/u for Systolic Heart Failure  HPI: Ryan [name] is a 75 y.o. 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  Hehas a history of endocarditis that resulted in mitral valve replacement in 2014. Heart cath prior to that surgery showed widely patent coronaries and moderate to severe pulmonary hypertension.  He was admitted in 12/21 with incessant SVT and CHF.  Echo showed EF 25-30% with severe RV dysfunction.  He initially had poor response to IV diuretics w/ rising SCr. RHC showed evidence for severe biventricular failure with PCWP and RA pressure >30 and PAPi 0.8. Equalization of diastolic pressures concerning for restrictive physiology. Cardiac output preserved (CI 2.28 Fick, 2.33 thermo). Was transferred to CCU for lasix gtt and later required addition of milrinone to aid in diuresis. 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  Also had frequent PVCs treated w/ amiodarone. Of note, LHC was not pursued given abnormal renal function. He was diuresed and transitioned to po diuretics. SCr down to 1.74 day of d/c (down from peak of 2.4).   He returns for followup of CHF.  He has a right foot ulcer followed by podiatry.  He is walking with a cane though not very active due to foot ulcer.  He has occasional right calf claudication.  No chest pain.  No palpitations.  He gets short of breath with moderate exertion like making up his bed.  No dyspnea walking on flat ground.  No orthopnea/PND.  No lightheadedness.    Labs (3/22): K 4.1, creatinine 1.7  Medtronic device  interrogation: Stable thoracic impedance, no AF/VT.   ECG (personally reviewed): a-paced, prolonged AV interval, QRS narrow.   Review of systems complete and found to be negative unless listed in HPI.    PMH: 1. Type 2 diabetes. 2. HTN 3. PAD: H/o right popliteal occlusion.  4. SVT: Incessant, slow AVNRT.  Ablation in 12/21.  5. PVCs: Frequent, up to 20% of beats by prior monitoring.  6. Atrial fibrillation: Paroxysmal. Has h/o CVA.  7. CVA: Related to atrial fibrillation.   8. 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  9. CKD stage 3 10. H/o junctional rhythm 11. Chronic systolic CHF: Nonischemic cardiomyopathy.  - LHC (1/14): no significant CAD. - Echo (12/21): EF 25-30% with dyssynchrony, severely decreased RV systolic function, biatrial enlargement, s/p bioprosthetic mitral valve replacement with mean gradient 6 mmHg and no MR, moderate TR.  - RHC (12/21): mean RA 31, PA 57/33 mean 45, mean PCWP 36, PAPi 0.8, CI 2.28, PVR 1.9 WU.  - CPX (4/22): peak VO2 15.9, VE/VCO2 slope 59, RER 0.91 => submaximal, moderate HF limitation.    Current Outpatient Medications  Medication Sig Dispense Refill  . acetaminophen (TYLENOL) 325 MG tablet Take 650 mg by mouth every 6 (six) hours as needed for mild pain or headache.    Marland Kitchen amiodarone (PACERONE) 200 MG tablet Take 1 tablet (200 mg total) by mouth daily. 30 tablet 6  . atorvastatin (LIPITOR) 40 MG tablet Take 1 tablet (40 mg total) by mouth daily at 6 PM. 30 tablet 5  . empagliflozin (JARDIANCE)  10 MG TABS tablet Take 1 tablet (10 mg total) by mouth daily. 90 tablet 3  . gabapentin (NEURONTIN) 300 MG capsule Take 300 mg by mouth at bedtime.    Marland Kitchen HYDROcodone-acetaminophen (NORCO/VICODIN) 5-325 MG per tablet Take 1 tablet by mouth 2 (two) times daily as needed for moderate pain.     . metoprolol succinate (TOPROL-XL) 25 MG 24 hr tablet Take 1 tablet (25 mg total) by mouth daily. 30 tablet 6  . Multiple  Vitamins-Minerals (CENTRUM SILVER PO) Take 1 tablet by mouth 3 (three) times a week.    . pantoprazole (PROTONIX) 40 MG tablet Take 40 mg by mouth daily.     . sacubitril-valsartan (ENTRESTO) 24-26 MG Take 1 tablet by mouth 2 (two) times daily. 60 tablet 11  . spironolactone (ALDACTONE) 25 MG tablet Take 0.5 tablets (12.5 mg total) by mouth daily. 30 tablet 5  . tamsulosin (FLOMAX) 0.4 MG CAPS capsule Take 0.4 mg by mouth daily.     Marland Kitchen triamcinolone cream (KENALOG) 0.5 % Apply 1 application topically daily as needed (skin rash).     . warfarin (COUMADIN) 2.5 MG tablet Take 2.'5mg'$  ( 1 tablet ) Monday, wednesday and Friday  Other days take '5mg'$  (2 tablets) 44 tablet 1   No current facility-administered medications for this encounter.    Allergies  Allergen Reactions  . Geralyn Flash [Fish Allergy] Nausea And Vomiting      Social History   Socioeconomic History  . Marital status: Legally Separated    Spouse name: Not on file  . Number of children: 3  . Years of education: Not on file  . Highest education level: Not on file  Occupational History  . Not on file  Tobacco Use  . Smoking status: Never Smoker  . Smokeless tobacco: Never Used  Vaping Use  . Vaping Use: Never used  Substance and Sexual Activity  . Alcohol use: No    Alcohol/week: 0.0 standard drinks  . Drug use: No  . Sexual activity: Not on file  Other Topics Concern  . Not on file  Social History Narrative   Daughter lives with him.  Retired Advertising account planner   Social Determinants of Radio broadcast assistant Strain: Not on [employer] Insecurity: No [employer]  . Worried About Charity fundraiser in the Last Year: Never true  . Ran Out of Food in the Last Year: Never true  Transportation Needs: No Transportation Needs  . Lack of Transportation (Medical): No  . Lack of Transportation (Non-Medical): No  Physical Activity: Not on file  Stress: Not on file  Social Connections: Not on file  Intimate Partner  Violence: Not on file     Family History  Problem Relation Age of Onset  . Hypertension Mother   . Hypertension Father     Vitals:   01/22/21 1121  BP: 122/60  Pulse: 64  SpO2: 100%  Weight: 73.3 kg (161 lb 9.6 oz)   Wt Readings from Last 3 Encounters:  01/22/21 73.3 kg (161 lb 9.6 oz)  11/25/20 73.2 kg (161 lb 6.4 oz)  11/07/20 75.7 kg (166 lb 12.8 oz)   PHYSICAL EXAM: General: NAD Neck: No JVD, no thyromegaly or thyroid nodule.  Lungs: Clear to auscultation bilaterally with normal respiratory effort. CV: Nondisplaced PMI.  Heart regular S1/S2, no S3/S4, no murmur.  No peripheral edema.  No carotid bruit.  Unable to palpate pedal pulses.  Abdomen: Soft, nontender, no hepatosplenomegaly, no distention.  Skin: Intact  without lesions or rashes.  Neurologic: Alert and oriented x 3.  Psych: Normal affect. Extremities: No clubbing or cyanosis.  HEENT: Normal.   ASSESSMENT & PLAN: 1. Chronic Systolic CHF: Biventricular failure. Has MDT ICD. Echo 12/21 showed EF 20-25%, moderate RV enlargement with severely decreased RV systolic function, normally functioning bioprosthetic mitral valve with no significant MR and mean gradient 6, moderate TR, dilated IVC. Patient had normal EF in 12/13 pre-mitral valve replacement. 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 His last cath was in 1/14 pre-surgery, no significant CAD. On [place] in 12/21, evidence for severe biventricular failure with PCWP and RA pressure >30 and PAPi 0.8. Equalization of diastolic pressures concerning for restrictive physiology. Cardiac output preserved (CI 2.28 Fick, 2.33 thermo).  Moderate HF limitation on 4/22 CPX.It is possible that cardiomyopathy is at least in part tachycardia-mediated given near incessant slow AVNRT in 12/21. Also has history of frequent PVCs. Cannot rule out coronary disease, though no coronary angiography has  been done yet with elevated creatinine.  Symptoms are NYHA class II-III, he is not volume overloaded on exam.  - He does not need scheduled diuretics, lasix prn - Narrow QRS, not candidate for CRT.  - Stop losartan, start Entresto 24/26 bid. BMET today and again in 10 days.  - Continue Toprol XL 25 mg daily. - Continue Jardiance 10 mg daily. - Continue spironolactone 12.5 daily.  - With improvement in creatinine, I will arrange for LHC/RHC.  We need to see if CAD plays a role in his cardiomyopathy.  We discussed risks/benefits and he agrees to procedure.  With PAF and h/o CVA, he will need Lovenox bridge off  - I will arrange for repeat echo 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 S/P ablation 09/03/20. It is possible that incessant relatively slow AVNRT contributed to cardiomyopathy. He remains in NSR today.  - We need to keep in NSR if at all possible given concern for tachy-mediated CMP.  3. PVCs: History of very frequent PVCs. This may play a role in biventricular failure.  - Continue PO amio 200 mg daily, check LFTs and TSH today.  Will need regular eye exam.    4. PAD: h/o right popliteal occlusion.  - Continue atorvastatin.  -  Needs reassessment of LE circulation given right foot ulceration.  I will refer back to [facility],  5. Atrial fibrillation: Paroxysmal.  Has history of CVA.  NSR today.  - On warfarin. INRs followed by PCP in [place]. 6. Bioprosthetic mitral valve: Stable function on echo in 12/21.  7. CKD stage 3: BMET today.    Echo + Followup in 1 month.   Loralie Champagne, MD 01/22/21

## 2021-01-22 NOTE — Telephone Encounter (Signed)
Received call from [facility] that patient has a nonhealing right foot ulcer and needs to be seen urgently. Left a message with patient to call back - will schedule with PA/VWB and bil ABI

## 2021-01-23 ENCOUNTER — Ambulatory Visit (HOSPITAL_COMMUNITY)
Admission: RE | Admit: 2021-01-23 | Discharge: 2021-01-23 | Disposition: A | Discharge status: Home / Self Care | Payer: Medicare HMO | Source: Ambulatory Visit | Attending: Vascular Surgery | Admitting: Vascular Surgery

## 2021-01-23 ENCOUNTER — Other Ambulatory Visit (HOSPITAL_COMMUNITY): Payer: Self-pay | Admitting: Surgery

## 2021-01-23 ENCOUNTER — Ambulatory Visit: Payer: Medicare HMO | Admitting: Physician Assistant

## 2021-01-23 VITALS — BP 118/70 | HR 72 | Temp 98.4°F | Resp 20 | Ht 72.0 in | Wt 159.7 lb

## 2021-01-23 DIAGNOSIS — E1169 Type 2 diabetes mellitus with other specified complication: Secondary | ICD-10-CM | POA: Diagnosis not present

## 2021-01-23 DIAGNOSIS — I739 Peripheral vascular disease, unspecified: Secondary | ICD-10-CM

## 2021-01-23 DIAGNOSIS — I779 Disorder of arteries and arterioles, unspecified: Secondary | ICD-10-CM

## 2021-01-23 DIAGNOSIS — M869 Osteomyelitis, unspecified: Secondary | ICD-10-CM | POA: Diagnosis not present

## 2021-01-23 NOTE — Progress Notes (Signed)
History of Present Illness:  Patient is a 75 y.o. year old male who presents for evaluation of non healing wound.  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  He also had a right great toe ulceration that he was able to heal with wound care around that time.  He now has a GT tip ulcer and a second toe open wound.  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    He is followed by Cardiology with history of history of endocarditis s/p bioprosthetic mitral valve replacement in 2014, atrial fibrillation/flutter, HTN, DM2,PAD (followed by vascular)chronic systolic heart failure, embolic CVA, anticoagulated with coumadin.  He has CKD stage III  SCr down to 1.74 day of d/c (down from peak of 2.4).      Past Medical History:  Diagnosis Date  . AICD (automatic cardioverter/defibrillator) present   . Atrial fibrillation ([place])   . 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 CHF (congestive heart failure) ([place])   . Diabetes mellitus without complication ([place])   . Endocarditis   . Hypertension   . Peripheral vascular disease ([place])   . PVC's (premature ventricular contractions)   . SVT (supraventricular tachycardia)  long RP     Past Surgical History:  Procedure Laterality Date  . ABDOMINAL AORTAGRAM N/A 10/03/2012   Procedure: ABDOMINAL Maxcine Ham;  Surgeon: [name], MD;  Location: [facility] CATH LAB;  Service: Cardiovascular;  Laterality: N/A;  . CORONARY ANGIOGRAM  09/21/2012   Procedure: CORONARY ANGIOGRAM;  Surgeon: [name], MD;  Location: [facility] CATH LAB;  Service: Cardiovascular;;  . EP IMPLANTABLE DEVICE N/A 06/11/2016   Procedure: ICD Implant;  Surgeon: Will Meredith Leeds, MD;  Location: [place] CV LAB;  Service: Cardiovascular;  Laterality: N/A;  . EXTREMITY WIRE/PIN REMOVAL  09/14/2012   Procedure: REMOVAL K-WIRE/PIN EXTREMITY;  Surgeon: [name], MD;  Location: [place];  Service: Orthopedics;  Laterality: Right;   Right Foot  . I & D EXTREMITY  09/14/2012   Procedure: IRRIGATION AND DEBRIDEMENT EXTREMITY;  Surgeon: Tennis Must, MD;  Location: [place];  Service: Orthopedics;  Laterality: Right;  . INTRAOPERATIVE TRANSESOPHAGEAL ECHOCARDIOGRAM  09/26/2012   Procedure: INTRAOPERATIVE TRANSESOPHAGEAL ECHOCARDIOGRAM;  Surgeon: [name], MD;  Location: [facility] OR;  Service: Open Heart Surgery;  Laterality: N/A;  . MITRAL VALVE REPLACEMENT  09/26/2012   Procedure: MITRAL VALVE (MV) REPLACEMENT;  Surgeon: [name], MD;  Location: [place] OR;  Service: Open Heart Surgery;  Laterality: N/A;  . RIGHT HEART CATH N/A 08/29/2020   Procedure: RIGHT HEART CATH;  Surgeon: Larey Dresser, MD;  Location: [place] CV LAB;  Service: Cardiovascular;  Laterality: N/A;  . RIGHT HEART CATHETERIZATION  09/21/2012   Procedure: RIGHT HEART CATH;  Surgeon: [name], MD;  Location: [facility] CATH LAB;  Service: Cardiovascular;;  . SVT ABLATION N/A 09/03/2020   Procedure: SVT ABLATION;  Surgeon: Constance Haw, MD;  Location: [place] CV LAB;  Service: Cardiovascular;  Laterality: N/A;  . TEE WITHOUT CARDIOVERSION  09/18/2012   Procedure: TRANSESOPHAGEAL ECHOCARDIOGRAM (TEE);  Surgeon: [name], MD;  Location: [facility] ENDOSCOPY;  Service: Cardiovascular;  Laterality: N/A;    ROS:   General:  No weight loss, Fever, chills  HEENT: No recent headaches, no nasal bleeding, no visual changes, no sore throat  Neurologic: No dizziness, blackouts, seizures. No recent symptoms of stroke or mini- stroke. No  recent episodes of slurred speech, or temporary blindness.  Cardiac: No recent episodes of chest pain/pressure, no shortness of breath at rest.  No shortness of breath with exertion.  Denies history of atrial fibrillation or irregular heartbeat  Vascular: No history of rest pain in feet.  No history of claudication.  No history of non-healing ulcer, No history of DVT   Pulmonary: No home oxygen, no productive cough, no  hemoptysis,  No asthma or wheezing  Musculoskeletal:  '[ ]'$  Arthritis, '[ ]'$  Low back pain,  '[ ]'$  Joint pain  Hematologic:No history of hypercoagulable state.  No history of easy bleeding.  No history of anemia  Gastrointestinal: No hematochezia or melena,  No gastroesophageal reflux, no trouble swallowing  Urinary: '[ ]'$  chronic Kidney disease, '[ ]'$  on HD - '[ ]'$  MWF or '[ ]'$  TTHS, '[ ]'$  Burning with urination, '[ ]'$  Frequent urination, '[ ]'$  Difficulty urinating;   Skin: No rashes  Psychological: No history of anxiety,  No history of depression  Social History Social History   Tobacco Use  . Smoking status: Never Smoker  . Smokeless tobacco: Never Used  Vaping Use  . Vaping Use: Never used  Substance Use Topics  . Alcohol use: No    Alcohol/week: 0.0 standard drinks  . Drug use: No    Family History Family History  Problem Relation Age of Onset  . Hypertension Mother   . Hypertension Father     Allergies  Allergies  Allergen Reactions  . Geralyn Flash [Fish Allergy] Nausea And Vomiting     Current Outpatient Medications  Medication Sig Dispense Refill  . acetaminophen (TYLENOL) 325 MG tablet Take 650 mg by mouth every 6 (six) hours as needed for mild pain or headache.    Marland Kitchen amiodarone (PACERONE) 200 MG tablet Take 1 tablet (200 mg total) by mouth daily. 30 tablet 6  . atorvastatin (LIPITOR) 40 MG tablet Take 1 tablet (40 mg total) by mouth daily at 6 PM. 30 tablet 5  . empagliflozin (JARDIANCE) 10 MG TABS tablet Take 1 tablet (10 mg total) by mouth daily. 90 tablet 3  . gabapentin (NEURONTIN) 300 MG capsule Take 300 mg by mouth at bedtime.    Marland Kitchen HYDROcodone-acetaminophen (NORCO/VICODIN) 5-325 MG per tablet Take 1 tablet by mouth 2 (two) times daily as needed for moderate pain.     Marland Kitchen KLOR-CON M20 20 MEQ tablet     . metoprolol succinate (TOPROL-XL) 25 MG 24 hr tablet Take 1 tablet (25 mg total) by mouth daily. 30 tablet 6  . Multiple Vitamins-Minerals (CENTRUM SILVER PO) Take 1 tablet by  mouth 3 (three) times a week.    . pantoprazole (PROTONIX) 40 MG tablet Take 40 mg by mouth daily.     . sacubitril-valsartan (ENTRESTO) 24-26 MG Take 1 tablet by mouth 2 (two) times daily. 60 tablet 11  . spironolactone (ALDACTONE) 25 MG tablet Take 0.5 tablets (12.5 mg total) by mouth daily. 30 tablet 5  . tamsulosin (FLOMAX) 0.4 MG CAPS capsule Take 0.4 mg by mouth daily.     Marland Kitchen triamcinolone cream (KENALOG) 0.5 % Apply 1 application topically daily as needed (skin rash).     . warfarin (COUMADIN) 2.5 MG tablet Take 2.'5mg'$  ( 1 tablet ) Monday, wednesday and Friday  Other days take '5mg'$  (2 tablets) 44 tablet 1   No current facility-administered medications for this visit.    Physical Examination  Vitals:   01/23/21 1420  BP: 118/70  Pulse: 72  Resp: 20  Temp: 98.4 F (36.9 C)  TempSrc: Temporal  SpO2: 99%  Weight: 159 lb 11.2 oz (72.4 kg)  Height: 6' (1.829 m)    Body mass index is 21.66 kg/m.  General:  Alert and oriented, no acute distress HEENT: Normal Neck: No bruit or JVD Pulmonary: Clear to auscultation bilaterally Cardiac: Regular Rate and Rhythm  Abdomen: Soft, non-tender, non-distended, no mass, no scars Skin: No rash.  Right GT tip non healing ulcer and kissing ulcer on the right second toe between the GT and second toe.  Musculoskeletal: No deformity or edema  Neurologic: Upper and lower extremity motor 5/5 and symmetric  DATA:  ABI Findings:  +---------+------------------+-----+----------+----------------------------  ----+  Right  Rt Pressure (mmHg)IndexWaveform Comment               +---------+------------------+-----+----------+----------------------------  ----+  Brachial 132                                   +---------+------------------+-----+----------+----------------------------  ----+  PTA   >230          monophasic                    +---------+------------------+-----+----------+----------------------------  ----+  DP    43           monophasic                   +---------+------------------+-----+----------+----------------------------  ----+  Great Toe                 Unable to obtain  PPG/pressure                          due to wound and bandaging      +---------+------------------+-----+----------+----------------------------  ----+   +---------+------------------+-----+----------+-------+  Left   Lt Pressure (mmHg)IndexWaveform Comment  +---------+------------------+-----+----------+-------+  Brachial 128                      +---------+------------------+-----+----------+-------+  PTA   >230          monophasic      +---------+------------------+-----+----------+-------+  DP    >230          monophasic      +---------+------------------+-----+----------+-------+  Great Toe24        0.18            +---------+------------------+-----+----------+-------+   +-------+-----------+----------------+-----------------+------------+  ABI/TBIToday's ABIToday's TBI   Previous ABI   Previous TBI  +-------+-----------+----------------+-----------------+------------+  Right 0.33    Unable to obtain0.86 (unreliable)0        +-------+-----------+----------------+-----------------+------------+  Left  [place]     0.18              0.44      +-------+-----------+----------------+-----------------+------------+      Summary:  Right: Resting right ankle-brachial index indicates severe right lower  extremity arterial disease.   Left: Resting left ankle-brachial index indicates noncompressible left  lower extremity arteries. The left toe-brachial index is abnormal.     ASSESSMENT/Plan PAD with chronic right GT ulcer on/off ABI's monophasic flow with calcification.   History of angiography 2014  which revealed an occluded right popliteal artery. We had been managing his wound nonoperatively. Foot x ray taken at [facility] office reveals osteomyelitis right second toe. He denise symptoms of rest pain or claudication.  Pre angiogram planning: Coumadin transition with bioprosthetic mitral valve replacement in 2014 and  atrial  fibrillation/flutter. CKD stage III plan for Co2      Roxy Horseman PA-C [facility] Office: [phone]  MD on call Donzetta Matters

## 2021-01-26 ENCOUNTER — Other Ambulatory Visit (HOSPITAL_COMMUNITY): Payer: Self-pay

## 2021-01-26 DIAGNOSIS — I5042 Chronic combined systolic (congestive) and diastolic (congestive) heart failure: Secondary | ICD-10-CM

## 2021-01-27 ENCOUNTER — Telehealth: Payer: Self-pay | Admitting: Pharmacist

## 2021-01-27 ENCOUNTER — Other Ambulatory Visit: Payer: Self-pay

## 2021-01-27 ENCOUNTER — Ambulatory Visit: Payer: Medicare HMO

## 2021-01-27 DIAGNOSIS — Z953 Presence of xenogenic heart valve: Secondary | ICD-10-CM

## 2021-01-27 DIAGNOSIS — Z5181 Encounter for therapeutic drug level monitoring: Secondary | ICD-10-CM

## 2021-01-27 DIAGNOSIS — I480 Paroxysmal atrial fibrillation: Secondary | ICD-10-CM

## 2021-01-27 DIAGNOSIS — Z7901 Long term (current) use of anticoagulants: Secondary | ICD-10-CM | POA: Insufficient documentation

## 2021-01-27 DIAGNOSIS — L97509 Non-pressure chronic ulcer of other part of unspecified foot with unspecified severity: Secondary | ICD-10-CM | POA: Diagnosis not present

## 2021-01-27 DIAGNOSIS — N529 Male erectile dysfunction, unspecified: Secondary | ICD-10-CM | POA: Diagnosis not present

## 2021-01-27 DIAGNOSIS — I509 Heart failure, unspecified: Secondary | ICD-10-CM | POA: Diagnosis not present

## 2021-01-27 DIAGNOSIS — I10 Essential (primary) hypertension: Secondary | ICD-10-CM | POA: Diagnosis not present

## 2021-01-27 DIAGNOSIS — I429 Cardiomyopathy, unspecified: Secondary | ICD-10-CM | POA: Diagnosis not present

## 2021-01-27 DIAGNOSIS — N1832 Chronic kidney disease, stage 3b: Secondary | ICD-10-CM | POA: Diagnosis not present

## 2021-01-27 DIAGNOSIS — Z9889 Other specified postprocedural states: Secondary | ICD-10-CM

## 2021-01-27 DIAGNOSIS — J309 Allergic rhinitis, unspecified: Secondary | ICD-10-CM | POA: Diagnosis not present

## 2021-01-27 DIAGNOSIS — E11621 Type 2 diabetes mellitus with foot ulcer: Secondary | ICD-10-CM | POA: Diagnosis not present

## 2021-01-27 DIAGNOSIS — I482 Chronic atrial fibrillation, unspecified: Secondary | ICD-10-CM | POA: Diagnosis not present

## 2021-01-27 DIAGNOSIS — N1831 Chronic kidney disease, stage 3a: Secondary | ICD-10-CM | POA: Diagnosis not present

## 2021-01-27 LAB — BASIC METABOLIC PANEL
BUN/Creatinine Ratio: 22 (ref 10–24)
BUN: 37 mg/dL — ABNORMAL HIGH (ref 8–27)
CO2: 17 mmol/L — ABNORMAL LOW (ref 20–29)
Calcium: 8.7 mg/dL (ref 8.6–10.2)
Chloride: 108 mmol/L — ABNORMAL HIGH (ref 96–106)
Creatinine, Ser: 1.7 mg/dL — ABNORMAL HIGH (ref 0.76–1.27)
Glucose: 107 mg/dL — ABNORMAL HIGH (ref 65–99)
Potassium: 4.6 mmol/L (ref 3.5–5.2)
Sodium: 140 mmol/L (ref 134–144)
eGFR: 41 mL/min/{1.73_m2} — ABNORMAL LOW (ref >59)

## 2021-01-27 LAB — POCT INR: INR: 5.3 — AB (ref 2.0–3.0)

## 2021-01-27 MED ORDER — ENOXAPARIN SODIUM 80 MG/0.8ML IJ SOSY: 80.0000 mg | PREFILLED_SYRINGE | Freq: Two times a day (BID) | INTRAMUSCULAR | 1 refills | Status: DC

## 2021-01-27 NOTE — Patient Instructions (Signed)
Hold Warfarin next 4 days then continue 2 tablets Daily.  Heart Cath 5/13.  Repeat INR 5/17.  Follow Lovenox Bridging Instructions. [phone]   Patient scheduled for L and R heart cath on Friday. Per Dr. Aundra Dubin, pt is to hold warfarin 3 days prior and will need lovenox bridge due to hx of stroke. Patient's warfarin is managed by PCP. Patient has never done lovenox bridge before.  He will come to [facility] office today at 1:30 for bridging  Enoxaparin 80 BID  5/10: No warfarin or enoxaparin (Lovenox).  5/11: No warfarin or enoxaparin (Lovenox)  5/12: No warfarin or enoxaparin (Lovenox)  5/13: Procedure Day - No enoxaparin - Resume warfarin in the evening or as directed by doctor (take an extra half tablet with usual dose for 1 day then resume normal dose).  5/14: Resume enoxaparin inject in the fatty tissue every 12 hours and take warfarin  5/15: Inject enoxaparin in the fatty tissue every 12 hours and take warfarin  5/16: Inject enoxaparin in the fatty tissue every 12 hours and take warfarin  5/17: Check INR at [facility] office  Stop enoxaparin when INR is >2

## 2021-01-27 NOTE — Telephone Encounter (Signed)
Patient scheduled for L and R heart cath on Friday. Per Dr. Aundra Dubin, pt is to hold warfarin 3 days prior and will need lovenox bridge due to hx of stroke. Patient's warfarin is managed by PCP. Patient has never done lovenox bridge before.  He will come to [facility] office today at 1:30 for b  Enoxaparin 80 BID  5/10: No warfarin or enoxaparin (Lovenox).  5/11: Inject enoxaparin '80mg'$  in the fatty abdominal tissue at least 2 inches from the belly button twice a day about 12 hours apart, 10am and 10pm rotate sites. No warfarin.  5/12: Inject enoxaparin in the fatty tissue at 10am (NO PM dose) No warfarin.  5/13: Procedure Day - No enoxaparin - Resume warfarin in the evening or as directed by doctor (take an extra half tablet with usual dose for 1 day then resume normal dose).  5/14: Resume enoxaparin inject in the fatty tissue every 12 hours and take warfarin  5/15: Inject enoxaparin in the fatty tissue every 12 hours and take warfarin  5/16: Inject enoxaparin in the fatty tissue every 12 hours and take warfarin  5/17: Please have PCP check INR  Stop enoxaparin when INR is >2

## 2021-01-28 ENCOUNTER — Other Ambulatory Visit (HOSPITAL_COMMUNITY)
Admission: RE | Admit: 2021-01-28 | Discharge: 2021-01-28 | Disposition: A | Discharge status: Home / Self Care | Payer: Medicare HMO | Source: Ambulatory Visit | Attending: Cardiology | Admitting: Cardiology

## 2021-01-28 ENCOUNTER — Telehealth (HOSPITAL_COMMUNITY): Payer: Self-pay | Admitting: *Deleted

## 2021-01-28 DIAGNOSIS — Z01812 Encounter for preprocedural laboratory examination: Secondary | ICD-10-CM | POA: Insufficient documentation

## 2021-01-28 DIAGNOSIS — Z20822 Contact with and (suspected) exposure to covid-19: Secondary | ICD-10-CM | POA: Insufficient documentation

## 2021-01-28 LAB — SARS CORONAVIRUS 2 (TAT 6-24 HRS): SARS Coronavirus 2: NEGATIVE

## 2021-01-28 NOTE — Telephone Encounter (Signed)
-----   Message from Harvie Junior, [place] sent at 01/23/2021  4:43 PM EDT ----- Ina Homes w/[facility] left VM regarding visit today and would like to coordinate plan with Dr.McLean

## 2021-01-30 ENCOUNTER — Ambulatory Visit (HOSPITAL_COMMUNITY)
Admission: RE | Admit: 2021-01-30 | Discharge: 2021-01-30 | Disposition: A | Discharge status: Home / Self Care | Payer: Medicare HMO | Attending: Cardiology | Admitting: Cardiology

## 2021-01-30 ENCOUNTER — Other Ambulatory Visit: Payer: Self-pay

## 2021-01-30 ENCOUNTER — Ambulatory Visit (HOSPITAL_COMMUNITY)
Admission: RE | Disposition: A | Discharge status: Home / Self Care | Payer: Self-pay | Source: Home / Self Care | Attending: Cardiology

## 2021-01-30 DIAGNOSIS — I480 Paroxysmal atrial fibrillation: Secondary | ICD-10-CM | POA: Diagnosis not present

## 2021-01-30 DIAGNOSIS — I428 Other cardiomyopathies: Secondary | ICD-10-CM | POA: Insufficient documentation

## 2021-01-30 DIAGNOSIS — E1151 Type 2 diabetes mellitus with diabetic peripheral angiopathy without gangrene: Secondary | ICD-10-CM | POA: Insufficient documentation

## 2021-01-30 DIAGNOSIS — I130 Hypertensive heart and chronic kidney disease with heart failure and stage 1 through stage 4 chronic kidney disease, or unspecified chronic kidney disease: Secondary | ICD-10-CM | POA: Insufficient documentation

## 2021-01-30 DIAGNOSIS — I5022 Chronic systolic (congestive) heart failure: Secondary | ICD-10-CM | POA: Diagnosis not present

## 2021-01-30 DIAGNOSIS — I5082 Biventricular heart failure: Secondary | ICD-10-CM | POA: Insufficient documentation

## 2021-01-30 DIAGNOSIS — E1122 Type 2 diabetes mellitus with diabetic chronic kidney disease: Secondary | ICD-10-CM | POA: Insufficient documentation

## 2021-01-30 DIAGNOSIS — Z7984 Long term (current) use of oral hypoglycemic drugs: Secondary | ICD-10-CM | POA: Insufficient documentation

## 2021-01-30 DIAGNOSIS — I471 Supraventricular tachycardia: Secondary | ICD-10-CM | POA: Insufficient documentation

## 2021-01-30 DIAGNOSIS — Z79899 Other long term (current) drug therapy: Secondary | ICD-10-CM | POA: Diagnosis not present

## 2021-01-30 DIAGNOSIS — Z953 Presence of xenogenic heart valve: Secondary | ICD-10-CM | POA: Diagnosis not present

## 2021-01-30 DIAGNOSIS — N183 Chronic kidney disease, stage 3 unspecified: Secondary | ICD-10-CM | POA: Diagnosis not present

## 2021-01-30 DIAGNOSIS — I429 Cardiomyopathy, unspecified: Secondary | ICD-10-CM | POA: Diagnosis not present

## 2021-01-30 DIAGNOSIS — Z7901 Long term (current) use of anticoagulants: Secondary | ICD-10-CM | POA: Diagnosis not present

## 2021-01-30 HISTORY — PX: RIGHT/LEFT HEART CATH AND CORONARY ANGIOGRAPHY: CATH118266

## 2021-01-30 LAB — POCT I-STAT EG7
Acid-base deficit: 3 mmol/L — ABNORMAL HIGH (ref 0.0–2.0)
Acid-base deficit: 4 mmol/L — ABNORMAL HIGH (ref 0.0–2.0)
Bicarbonate: 22.6 mmol/L (ref 20.0–28.0)
Bicarbonate: 21.8 mmol/L (ref 20.0–28.0)
Calcium, Ion: 1.26 mmol/L (ref 1.15–1.40)
Calcium, Ion: 1.28 mmol/L (ref 1.15–1.40)
HCT: 34 % — ABNORMAL LOW (ref 39.0–52.0)
HCT: 35 % — ABNORMAL LOW (ref 39.0–52.0)
Hemoglobin: 11.6 g/dL — ABNORMAL LOW (ref 13.0–17.0)
Hemoglobin: 11.9 g/dL — ABNORMAL LOW (ref 13.0–17.0)
O2 Saturation: 69 %
O2 Saturation: 71 %
Potassium: 4.8 mmol/L (ref 3.5–5.1)
Potassium: 4.8 mmol/L (ref 3.5–5.1)
Sodium: 141 mmol/L (ref 135–145)
Sodium: 141 mmol/L (ref 135–145)
TCO2: 24 mmol/L (ref 22–32)
TCO2: 23 mmol/L (ref 22–32)
pCO2, Ven: 41.2 mmHg — ABNORMAL LOW (ref 44.0–60.0)
pCO2, Ven: 40.4 mmHg — ABNORMAL LOW (ref 44.0–60.0)
pH, Ven: 7.348 (ref 7.250–7.430)
pH, Ven: 7.34 (ref 7.250–7.430)
pO2, Ven: 38 mmHg (ref 32.0–45.0)
pO2, Ven: 40 mmHg (ref 32.0–45.0)

## 2021-01-30 LAB — GLUCOSE, CAPILLARY: Glucose-Capillary: 88 mg/dL (ref 70–99)

## 2021-01-30 LAB — PROTIME-INR
INR: 1.9 — ABNORMAL HIGH (ref 0.8–1.2)
Prothrombin Time: 21.5 seconds — ABNORMAL HIGH (ref 11.4–15.2)

## 2021-01-30 SURGERY — RIGHT/LEFT HEART CATH AND CORONARY ANGIOGRAPHY
Anesthesia: LOCAL

## 2021-01-30 MED ORDER — SODIUM CHLORIDE 0.9 % IV SOLN: 250.0000 mL | INTRAVENOUS | Status: DC | PRN

## 2021-01-30 MED ORDER — LIDOCAINE HCL (PF) 1 % IJ SOLN
INTRAMUSCULAR | Status: AC
  Filled 2021-01-30: qty 30

## 2021-01-30 MED ORDER — SODIUM CHLORIDE 0.9 % IV SOLN: INTRAVENOUS | Status: DC

## 2021-01-30 MED ORDER — SODIUM CHLORIDE 0.9% FLUSH: 3.0000 mL | Freq: Two times a day (BID) | INTRAVENOUS | Status: DC

## 2021-01-30 MED ORDER — LABETALOL HCL 5 MG/ML IV SOLN: 10.0000 mg | INTRAVENOUS | Status: DC | PRN

## 2021-01-30 MED ORDER — VERAPAMIL HCL 2.5 MG/ML IV SOLN
INTRAVENOUS | Status: AC
  Filled 2021-01-30: qty 2

## 2021-01-30 MED ORDER — SODIUM CHLORIDE 0.9 % WEIGHT BASED INFUSION: 1.0000 mL/kg/h | INTRAVENOUS | Status: DC

## 2021-01-30 MED ORDER — ASPIRIN 81 MG PO CHEW
81.0000 mg | CHEWABLE_TABLET | ORAL | Status: AC
  Administered 2021-01-30: 81 mg via ORAL
  Filled 2021-01-30: qty 1

## 2021-01-30 MED ORDER — IOHEXOL 350 MG/ML SOLN
INTRAVENOUS | Status: DC | PRN
  Administered 2021-01-30: 30 mL

## 2021-01-30 MED ORDER — LIDOCAINE HCL (PF) 1 % IJ SOLN
INTRAMUSCULAR | Status: DC | PRN
  Administered 2021-01-30: 4 mL
  Administered 2021-01-30: 15 mL

## 2021-01-30 MED ORDER — ONDANSETRON HCL 4 MG/2ML IJ SOLN: 4.0000 mg | Freq: Four times a day (QID) | INTRAMUSCULAR | Status: DC | PRN

## 2021-01-30 MED ORDER — MIDAZOLAM HCL 2 MG/2ML IJ SOLN
INTRAMUSCULAR | Status: AC
  Filled 2021-01-30: qty 2

## 2021-01-30 MED ORDER — HEPARIN (PORCINE) IN NACL 1000-0.9 UT/500ML-% IV SOLN
INTRAVENOUS | Status: AC
  Filled 2021-01-30: qty 1500

## 2021-01-30 MED ORDER — HEPARIN SODIUM (PORCINE) 1000 UNIT/ML IJ SOLN
INTRAMUSCULAR | Status: AC
  Filled 2021-01-30: qty 1

## 2021-01-30 MED ORDER — HEPARIN (PORCINE) IN NACL 1000-0.9 UT/500ML-% IV SOLN
INTRAVENOUS | Status: DC | PRN
  Administered 2021-01-30 (×2): 500 mL

## 2021-01-30 MED ORDER — SODIUM CHLORIDE 0.9% FLUSH: 3.0000 mL | INTRAVENOUS | Status: DC | PRN

## 2021-01-30 MED ORDER — FENTANYL CITRATE (PF) 100 MCG/2ML IJ SOLN
INTRAMUSCULAR | Status: AC
  Filled 2021-01-30: qty 2

## 2021-01-30 MED ORDER — FENTANYL CITRATE (PF) 100 MCG/2ML IJ SOLN
INTRAMUSCULAR | Status: DC | PRN
  Administered 2021-01-30 (×2): 25 ug via INTRAVENOUS

## 2021-01-30 MED ORDER — ACETAMINOPHEN 325 MG PO TABS: 650.0000 mg | ORAL_TABLET | ORAL | Status: DC | PRN

## 2021-01-30 MED ORDER — MIDAZOLAM HCL 2 MG/2ML IJ SOLN
INTRAMUSCULAR | Status: DC | PRN
  Administered 2021-01-30: 1 mg via INTRAVENOUS

## 2021-01-30 MED ORDER — HYDRALAZINE HCL 20 MG/ML IJ SOLN: 10.0000 mg | INTRAMUSCULAR | Status: DC | PRN

## 2021-01-30 SURGICAL SUPPLY — 14 items
CATH INFINITI 5FR MULTPACK ANG (CATHETERS) ×1 IMPLANT
CATH SWAN GANZ 7F STRAIGHT (CATHETERS) ×1 IMPLANT
CLOSURE MYNX CONTROL 5F (Vascular Products) ×1 IMPLANT
GLIDESHEATH SLEND SS 6F .021 (SHEATH) ×1 IMPLANT
GLIDESHEATH SLENDER 7FR .021G (SHEATH) ×2 IMPLANT
GUIDEWIRE .025 260CM (WIRE) ×1 IMPLANT
GUIDEWIRE INQWIRE 1.5J.035X260 (WIRE) IMPLANT
INQWIRE 1.5J .035X260CM (WIRE) ×2
KIT HEART LEFT (KITS) ×2 IMPLANT
PACK CARDIAC CATHETERIZATION (CUSTOM PROCEDURE TRAY) ×2 IMPLANT
SHEATH PINNACLE 5F 10CM (SHEATH) ×1 IMPLANT
TRANSDUCER W/STOPCOCK (MISCELLANEOUS) ×2 IMPLANT
WIRE EMERALD 3MM-J .035X150CM (WIRE) ×1 IMPLANT
WIRE HI TORQ VERSACORE-J 145CM (WIRE) ×1 IMPLANT

## 2021-01-30 NOTE — Progress Notes (Signed)
Enoxaparin ("Lovenox") Instructions    5/13: Procedure Day - No enoxaparin - Resume warfarin in the evening or as directed by doctor (take an extra half tablet with usual dose for1day then resume normal dose).  5/14: Resume enoxaparin inject in the fatty tissue every 12 hours and take warfarin  5/15: Inject enoxaparin in the fatty tissue every 12 hours and take warfarin  5/16: Inject enoxaparin in the fatty tissue every 12 hours and take warfarin  5/17: Check INR at [facility] office  (The [place] office will tell you when to stop your enoxaparin injections. The plan is to stop enoxaparin when INR is >2.)

## 2021-01-30 NOTE — Interval H&P Note (Signed)
History and Physical Interval Note:  01/30/2021 2:02 PM  Ryan [name]  has presented today for surgery, with the diagnosis of heart failure.  The various methods of treatment have been discussed with the patient and family. After consideration of risks, benefits and other options for treatment, the patient has consented to  Procedure(s): RIGHT/LEFT HEART CATH AND CORONARY ANGIOGRAPHY (N/A) as a surgical intervention.  The patient's history has been reviewed, patient examined, no change in status, stable for surgery.  I have reviewed the patient's chart and labs.  Questions were answered to the patient's satisfaction.     Lesette Frary [employer]

## 2021-01-30 NOTE — Progress Notes (Signed)
    Printed copy of these instructions given to pt in writing and to pt and his sister in law (caregiver ) verbally and in writing.         Signed           Enoxaparin ("Lovenox") Instructions    5/13: Procedure Day - No enoxaparin - Resume warfarin in the evening or as directed by doctor (take an extra half tablet with usual dose for1day then resume normal dose).  5/14: Resume enoxaparin inject in the fatty tissue every 12 hours and take warfarin  5/15: Inject enoxaparin in the fatty tissue every 12 hours and take warfarin  5/16: Inject enoxaparin in the fatty tissue every 12 hours and take warfarin  5/17:Check INR at [place] office  (The [place] office will tell you when to stop your enoxaparin injections. The plan is to stop enoxaparin when INR is >2.)

## 2021-01-30 NOTE — Progress Notes (Signed)
Up and walked and tolerated well; right groin stable, no bleeding or hematoma 

## 2021-01-30 NOTE — Discharge Instructions (Signed)

## 2021-01-30 NOTE — Progress Notes (Addendum)
Paged by nurse Lattie Haw to clarify plan for Coumadin after d/c. Chart reviewed - Lovenox/Coumadin bridge was outlined in Coumadin clinic note from 5/10. (There was no pre-cath bridge, but had post-cath bridge planned.) Patient was unsure if he had picked up Lovenox yet. Nurse spoke with family who confirmed they had not yet gotten it. I also had concomitantly called [facility] [place] and they confirmed they still have it filled ready for pickup and are open until 8. I relayed these instructions and information to [place]. Lattie Haw was told by family they are going to pick it up now. I copied the bridging instructions and posted it in a progress note that Lattie Haw will print out for the patient's daughter in law when she arrives to go over discharge instructions.  Nazaiah Navarrete PA-C

## 2021-02-02 ENCOUNTER — Encounter (HOSPITAL_COMMUNITY): Payer: Self-pay | Admitting: Cardiology

## 2021-02-02 MED FILL — Heparin Sod (Porcine)-NaCl IV Soln 1000 Unit/500ML-0.9%: INTRAVENOUS | Qty: 500 | Status: AC

## 2021-02-02 MED FILL — Verapamil HCl IV Soln 2.5 MG/ML: INTRAVENOUS | Qty: 2 | Status: AC

## 2021-02-03 ENCOUNTER — Ambulatory Visit (INDEPENDENT_AMBULATORY_CARE_PROVIDER_SITE_OTHER): Payer: Medicare HMO

## 2021-02-03 ENCOUNTER — Other Ambulatory Visit: Payer: Self-pay

## 2021-02-03 DIAGNOSIS — Z5181 Encounter for therapeutic drug level monitoring: Secondary | ICD-10-CM | POA: Diagnosis not present

## 2021-02-03 DIAGNOSIS — Z953 Presence of xenogenic heart valve: Secondary | ICD-10-CM

## 2021-02-03 DIAGNOSIS — Z9889 Other specified postprocedural states: Secondary | ICD-10-CM

## 2021-02-03 DIAGNOSIS — Z7901 Long term (current) use of anticoagulants: Secondary | ICD-10-CM

## 2021-02-03 DIAGNOSIS — I480 Paroxysmal atrial fibrillation: Secondary | ICD-10-CM

## 2021-02-03 LAB — POCT INR: INR: 2.7 (ref 2.0–3.0)

## 2021-02-03 NOTE — Patient Instructions (Signed)
continue 5 mg  Daily.  Heart Cath 5/13.  Repeat INR 2 weeks.

## 2021-02-04 ENCOUNTER — Other Ambulatory Visit: Payer: Self-pay | Admitting: Physician Assistant

## 2021-02-04 DIAGNOSIS — F5104 Psychophysiologic insomnia: Secondary | ICD-10-CM | POA: Diagnosis not present

## 2021-02-04 DIAGNOSIS — N529 Male erectile dysfunction, unspecified: Secondary | ICD-10-CM | POA: Diagnosis not present

## 2021-02-04 DIAGNOSIS — I509 Heart failure, unspecified: Secondary | ICD-10-CM | POA: Diagnosis not present

## 2021-02-04 DIAGNOSIS — Z7901 Long term (current) use of anticoagulants: Secondary | ICD-10-CM | POA: Diagnosis not present

## 2021-02-04 DIAGNOSIS — I482 Chronic atrial fibrillation, unspecified: Secondary | ICD-10-CM | POA: Diagnosis not present

## 2021-02-04 DIAGNOSIS — N1832 Chronic kidney disease, stage 3b: Secondary | ICD-10-CM | POA: Diagnosis not present

## 2021-02-04 DIAGNOSIS — I498 Other specified cardiac arrhythmias: Secondary | ICD-10-CM

## 2021-02-04 DIAGNOSIS — L97509 Non-pressure chronic ulcer of other part of unspecified foot with unspecified severity: Secondary | ICD-10-CM | POA: Diagnosis not present

## 2021-02-04 DIAGNOSIS — E11621 Type 2 diabetes mellitus with foot ulcer: Secondary | ICD-10-CM | POA: Diagnosis not present

## 2021-02-04 DIAGNOSIS — I429 Cardiomyopathy, unspecified: Secondary | ICD-10-CM | POA: Diagnosis not present

## 2021-02-06 DIAGNOSIS — E119 Type 2 diabetes mellitus without complications: Secondary | ICD-10-CM | POA: Diagnosis not present

## 2021-02-11 DIAGNOSIS — N1832 Chronic kidney disease, stage 3b: Secondary | ICD-10-CM | POA: Diagnosis not present

## 2021-02-11 DIAGNOSIS — I429 Cardiomyopathy, unspecified: Secondary | ICD-10-CM | POA: Diagnosis not present

## 2021-02-11 DIAGNOSIS — R600 Localized edema: Secondary | ICD-10-CM | POA: Diagnosis not present

## 2021-02-11 DIAGNOSIS — I482 Chronic atrial fibrillation, unspecified: Secondary | ICD-10-CM | POA: Diagnosis not present

## 2021-02-11 DIAGNOSIS — I509 Heart failure, unspecified: Secondary | ICD-10-CM | POA: Diagnosis not present

## 2021-02-11 DIAGNOSIS — L97509 Non-pressure chronic ulcer of other part of unspecified foot with unspecified severity: Secondary | ICD-10-CM | POA: Diagnosis not present

## 2021-02-11 DIAGNOSIS — E11621 Type 2 diabetes mellitus with foot ulcer: Secondary | ICD-10-CM | POA: Diagnosis not present

## 2021-02-11 DIAGNOSIS — J309 Allergic rhinitis, unspecified: Secondary | ICD-10-CM | POA: Diagnosis not present

## 2021-02-11 DIAGNOSIS — N529 Male erectile dysfunction, unspecified: Secondary | ICD-10-CM | POA: Diagnosis not present

## 2021-02-12 ENCOUNTER — Other Ambulatory Visit: Payer: Self-pay

## 2021-02-13 ENCOUNTER — Telehealth: Payer: Self-pay | Admitting: *Deleted

## 2021-02-13 DIAGNOSIS — I639 Cerebral infarction, unspecified: Secondary | ICD-10-CM

## 2021-02-13 NOTE — Telephone Encounter (Signed)
   [facility] Pre-operative Risk Assessment    Patient Name: ARTEM BUNTE  DOB: 1946-05-19  MRN: [id number]   HEARTCARE STAFF: - Please ensure there is not already an duplicate clearance open for this procedure. - Under Visit Info/Reason for Call, type in Other and utilize the format Clearance MM/DD/YY or Clearance TBD. Do not use dashes or single digits. - If request is for dental extraction, please clarify the # of teeth to be extracted.  Request for surgical clearance:  1. What type of surgery is being performed?  ABDOMINAL AORTOGRAM    2. When is this surgery scheduled?  03/03/21   3. What type of clearance is required (medical clearance vs. Pharmacy clearance to hold med vs. Both)?  BOTH  4. Are there any medications that need to be held prior to surgery and how long? COUMADIN X'S 3 DAYS   5. Practice name and name of physician performing surgery?   [facility] / DR. Trula Slade   6. What is the office phone number?  [phone]   7.   What is the office fax number?  [phone]  8.   Anesthesia type (None, local, MAC, general) ?     Jeanann Lewandowsky 02/13/2021, 4:15 PM  _________________________________________________________________   (provider comments below)

## 2021-02-17 NOTE — Telephone Encounter (Signed)
See message from Dr. Aundra Dubin - no need to complete echo prior to his procedure. Patient will need a call to ensure no new symptoms. Thank you!

## 2021-02-17 NOTE — Telephone Encounter (Signed)
   Name: Ryan [name]  DOB: 1946-02-19  MRN: [id number]   Primary Cardiologist: [name], MD  Chart reviewed as part of pre-operative protocol coverage.  Patient has a history of NICM with EF 25-30% on echo 08/2020. Recently underwent [facility] 01/30/21 which showed no evidence of CAD, low filling pressures, and preserved cardiac output. I is currently scheduled for a repeat echocardiogram 03/04/21.   Dr. Aundra Dubin - this patient is tentatively scheduled for an abdominal aortogram with LE to evaluate poorly healing foot ulcers on 03/03/21. Do you feel the echocardiogram needs to be completed prior to this procedure? If no, I will contact the patient and ensure no new symptoms since his last evaluation. If yes, I will try to move his echo appointment to an earlier slot, though there has been a significant delay recently. Please route your response back to [facility] PREOP. Thank you!   Abigail Butts, PA-C 02/17/2021, 4:02 PM

## 2021-02-17 NOTE — Telephone Encounter (Signed)
Patient with diagnosis of afib on warfarin for anticoagulation.    Procedure: ABDOMINAL AORTOGRAM   Date of procedure: 03/03/21  CHA2DS2-VASc Score = 6  This indicates a 9.7% annual risk of stroke. The patient's score is based upon: CHF History: Yes HTN History: Yes Diabetes History: No Stroke History: Yes Vascular Disease History: No Age Score: 2 Gender Score: 0      Per office protocol, patient can hold warfarin for 3 days prior to procedure.    Patient WILL need bridging with Lovenox (enoxaparin) around procedure.  Patients coumadin is managed by his PCP. Typically bridge would be managed by the managing provider. We have coordinated a last min bridge for patient in the past.

## 2021-02-18 ENCOUNTER — Other Ambulatory Visit: Payer: Self-pay

## 2021-02-18 ENCOUNTER — Telehealth: Payer: Self-pay

## 2021-02-18 DIAGNOSIS — N1832 Chronic kidney disease, stage 3b: Secondary | ICD-10-CM | POA: Diagnosis not present

## 2021-02-18 DIAGNOSIS — E11621 Type 2 diabetes mellitus with foot ulcer: Secondary | ICD-10-CM | POA: Diagnosis not present

## 2021-02-18 DIAGNOSIS — N529 Male erectile dysfunction, unspecified: Secondary | ICD-10-CM | POA: Diagnosis not present

## 2021-02-18 DIAGNOSIS — I509 Heart failure, unspecified: Secondary | ICD-10-CM | POA: Diagnosis not present

## 2021-02-18 DIAGNOSIS — J309 Allergic rhinitis, unspecified: Secondary | ICD-10-CM | POA: Diagnosis not present

## 2021-02-18 DIAGNOSIS — L97509 Non-pressure chronic ulcer of other part of unspecified foot with unspecified severity: Secondary | ICD-10-CM | POA: Diagnosis not present

## 2021-02-18 DIAGNOSIS — M25551 Pain in right hip: Secondary | ICD-10-CM | POA: Diagnosis not present

## 2021-02-18 DIAGNOSIS — L97514 Non-pressure chronic ulcer of other part of right foot with necrosis of bone: Secondary | ICD-10-CM | POA: Diagnosis not present

## 2021-02-18 DIAGNOSIS — I429 Cardiomyopathy, unspecified: Secondary | ICD-10-CM | POA: Diagnosis not present

## 2021-02-18 DIAGNOSIS — I739 Peripheral vascular disease, unspecified: Secondary | ICD-10-CM | POA: Diagnosis not present

## 2021-02-18 DIAGNOSIS — L97519 Non-pressure chronic ulcer of other part of right foot with unspecified severity: Secondary | ICD-10-CM | POA: Diagnosis not present

## 2021-02-18 DIAGNOSIS — I482 Chronic atrial fibrillation, unspecified: Secondary | ICD-10-CM | POA: Diagnosis not present

## 2021-02-18 MED ORDER — ENOXAPARIN SODIUM 80 MG/0.8ML IJ SOSY: 80.0000 mg | PREFILLED_SYRINGE | Freq: Two times a day (BID) | INTRAMUSCULAR | 0 refills | Status: DC

## 2021-02-18 NOTE — Telephone Encounter (Signed)
Telephone call received from pt/ daughter Ryan [name] requesting to move up the date of aortogram with Dr. Trula Slade currently scheduled on 03/03/21 per recommendations from office visit with podiatrist today. Both pt/daughter are aware that Dr. Trula Slade is not available and agree to r/s with Dr. [name] on 02/25/21.

## 2021-02-18 NOTE — Addendum Note (Signed)
Addended by: Nicholas Lose on: 02/18/2021 04:07 PM   Modules accepted: Miquel Dunn

## 2021-02-18 NOTE — Telephone Encounter (Signed)
   Name: Ryan [name]  DOB: 1946-01-25  MRN: [id number]   Primary Cardiologist: [name], MD  Chart reviewed as part of pre-operative protocol coverage. Patient was contacted 02/18/2021 in reference to pre-operative risk assessment for pending surgery as outlined below.Left Vm requesting call back.   Will forward to PCP so they are aware of patient's need for Lovenox bridge.   Loel Dubonnet, NP 02/18/2021, 10:21 AM

## 2021-02-19 NOTE — Telephone Encounter (Signed)
I spoke with patient. He said he was at his PCP yesterday and they did not check his INR. We only have a person in the coumadin clinic in [place] on tuesdays. His procedure is now 6/8. I have asked him to go to the [facility] office tomorrow AM for a lab draw. I will order a stat INR so it will come back quickly.  He is to hold warfarin starting Sunday.  He states he thinks his son is coming in Monday. His daughter was the one who gave him the injections last time.  Will give instructions once lab comes back.

## 2021-02-19 NOTE — Addendum Note (Signed)
Addended by: Marcelle Overlie D on: 02/19/2021 04:30 PM   Modules accepted: Orders

## 2021-02-19 NOTE — Addendum Note (Signed)
Addended by: Marcelle Overlie D on: 02/19/2021 04:24 PM   Modules accepted: Orders

## 2021-02-19 NOTE — Telephone Encounter (Signed)
   Name: Ryan [name]  DOB: 18-Mar-1946  MRN: [id number]   Primary Cardiologist: [name], MD  Chart reviewed as part of pre-operative protocol coverage. Patient was contacted 02/19/2021 in reference to pre-operative risk assessment for pending surgery as outlined below.  Ryan [name] was last seen on 01/30/21 by Dr. Aundra Dubin for cardiac catheterization. Cardiac cath with low filling pressures, preserved CO, no significant CAD. Since that day, Ryan [name] has done well. He reports no new anginal symptoms nor new shortness of breath.  Therefore, based on ACC/AHA guidelines, the patient would be at acceptable risk for the planned procedure without further cardiovascular testing.   He verbalizes understanding to hold Warfarin 3 days prior to planned procedure. Has not yet been contacted by his PCP to discuss Lovenox bridge. Will route to our pharmacy team to request to coordinate Lovenox bridge as I am not confident he will reach out to his PCP. He notes no trouble with previous Lovenox injections.   The patient was advised that if he develops new symptoms prior to surgery to contact our office to arrange for a follow-up visit, and he verbalized understanding.  I will route this recommendation to the requesting party via Epic fax function and remove from pre-op pool. Please call with questions.  Loel Dubonnet, NP 02/19/2021, 3:07 PM

## 2021-02-20 DIAGNOSIS — I639 Cerebral infarction, unspecified: Secondary | ICD-10-CM | POA: Diagnosis not present

## 2021-02-20 LAB — PROTIME-INR
INR: 4.5 — ABNORMAL HIGH (ref 0.9–1.2)
Prothrombin Time: 44.9 s — ABNORMAL HIGH (ref 9.1–12.0)

## 2021-02-20 MED ORDER — ENOXAPARIN SODIUM 100 MG/ML IJ SOSY: 100.0000 mg | PREFILLED_SYRINGE | INTRAMUSCULAR | 0 refills | Status: DC

## 2021-02-20 NOTE — Telephone Encounter (Addendum)
I will send in lovenox rx incase pharmacy has to order since it if Friday and order would not come in until Monday. Depending on his INR he would need to start holding warfarin the latest on Sunday. Last dose Saturday. He would need to give one dose of lovenox '100mg'$  on Monday night at 9PM. Nothing on Tuesday. Wed night he could resume warfarin if ok by MD. Thursday AM he would resume lovenox '100mg'$  daily  Tentitive bridge  6/4: Last dose of warfarin.  6/5: No warfarin or enoxaparin (Lovenox).  6/6: Inject enoxaparin '100mg'$  in the fatty abdominal tissue at least 2 inches from the belly at 9 PM. No warfarin.  6/7: No warfarin, No enoxaparain  6/8: Procedure Day - No enoxaparin - Resume warfarin in the evening or as directed by doctor   6/9: Resume enoxaparin inject in the fatty tissue every 24 hours at 8 AM and take warfarin  6/10: Inject enoxaparin in the fatty tissue every 24 hours and take warfarin  6/11: Inject enoxaparin in the fatty tissue every 24 hours and take warfarin  6/12: Inject enoxaparin in the fatty tissue every 24 hours and take warfarin  6/13: Inject enoxaparin in the fatty tissue every 24 hours and take warfarin  6/14: warfarin appt to check INR.

## 2021-02-20 NOTE — Addendum Note (Signed)
Addended by: Marcelle Overlie D on: 02/20/2021 04:38 PM   Modules accepted: Orders

## 2021-02-20 NOTE — Telephone Encounter (Addendum)
INR is 4.5. Therefore patient will begin holding tonight.  Patient made aware not to take any more warfarin until after his procedure. Will call his son on Monday with instructions. Will still give 1 dose of lovenox pre-procedure Monday at 9PM   6/3: No warfarin or enoxaparin (Lovenox).  6/4: No warfarin or enoxaparin (Lovenox).  6/5: No warfarin or enoxaparin (Lovenox).  6/6: Inject enoxaparin '100mg'$  in the fatty abdominal tissue at least 2 inches from the belly at 9 PM. No warfarin.  6/7: No warfarin, No enoxaparain  6/8: Procedure Day - No enoxaparin - Resume warfarin in the evening or as directed by doctor   6/9: Resume enoxaparin inject in the fatty tissue every 24 hours at 8 AM and take warfarin  6/10: Inject enoxaparin in the fatty tissue every 24 hours and take warfarin  6/11: Inject enoxaparin in the fatty tissue every 24 hours and take warfarin  6/12: Inject enoxaparin in the fatty tissue every 24 hours and take warfarin  6/13: Inject enoxaparin in the fatty tissue every 24 hours and take warfarin  6/14: warfarin appt to check INR.

## 2021-02-20 NOTE — Telephone Encounter (Signed)
No INR results yet. Called pt to confirm he went to lab. Left VM for him to call back

## 2021-02-20 NOTE — Telephone Encounter (Signed)
I spoke with patient. He still has some '80mg'$  lovenox syringes at home, but prefers to do injections once a day- so we will get the '100mg'$  syringes from the pharmacy. His son is coming on Monday and he said we can review lovenox instructions with him. Can send to mychart- his daughter in law has access to it. He did go to the lab this AM around 10:00. No results yet. Will need to follow up Monday. I reminded him that his last dose is tomorrow. No warfarin Monday-Tuesday  We will call pt Monday to finalize instructions- Son should be in town around [facility]

## 2021-02-23 NOTE — Addendum Note (Signed)
Addended by: Alwin Lanigan E on: 02/23/2021 01:49 PM   Modules accepted: Orders

## 2021-02-23 NOTE — Telephone Encounter (Addendum)
Called pt and his son. He states he gave an '80mg'$  Lovenox injection at 11:30am this AM. Per bridging instructions already sent to pt, he had a preference for once daily Lovenox dosing so a '100mg'$  rx was sent to his pharmacy.  He states he has a lot of the '80mg'$  syringes still left at home and prefers to use these. States he has at least 12 syringes. Will adjust Lovenox bridge as below:   6/6: already injected Lovenox '80mg'$  at 11:30am. Will give another injection at 10:30pm when he goes to bed. No warfarin.  6/7: Inject Lovenox '80mg'$  at 9:30am. No warfarin, no evening dose of Lovenox.  6/8: Procedure at 9:30am. Ask surgeon about resuming warfarin tonight.  6/9: Inject Lovenox '80mg'$  twice daily, 12 hours apart. Take normal warfarin dose.  6/10: Inject Lovenox '80mg'$  twice daily, 12 hours apart. Take normal warfarin dose.  6/11: Inject Lovenox '80mg'$  twice daily, 12 hours apart. Take normal warfarin dose.  6/12: Inject Lovenox '80mg'$  twice daily, 12 hours apart. Take normal warfarin dose.  6/13: Inject Lovenox '80mg'$  twice daily, 12 hours apart. Take normal warfarin dose.  6/14: Recheck INR in [place] office. Can stop Lovenox once INR is therapeutic. Then have PCP resume warfarin monitoring.  Will send updated instructions to pt via MyChart message along with direct # to Coumadin clinic so he can call in case of any issues.

## 2021-02-25 ENCOUNTER — Other Ambulatory Visit (HOSPITAL_COMMUNITY): Payer: Self-pay | Admitting: Vascular Surgery

## 2021-02-25 ENCOUNTER — Ambulatory Visit (HOSPITAL_BASED_OUTPATIENT_CLINIC_OR_DEPARTMENT_OTHER): Payer: Medicare HMO

## 2021-02-25 ENCOUNTER — Other Ambulatory Visit: Payer: Self-pay

## 2021-02-25 ENCOUNTER — Ambulatory Visit (HOSPITAL_BASED_OUTPATIENT_CLINIC_OR_DEPARTMENT_OTHER)
Admission: RE | Admit: 2021-02-25 | Discharge: 2021-02-25 | Disposition: A | Discharge status: Home / Self Care | Payer: Medicare HMO | Source: Ambulatory Visit | Attending: Vascular Surgery | Admitting: Vascular Surgery

## 2021-02-25 ENCOUNTER — Encounter (HOSPITAL_COMMUNITY)
Admission: RE | Disposition: A | Discharge status: Home / Self Care | Payer: Self-pay | Source: Home / Self Care | Attending: Vascular Surgery

## 2021-02-25 ENCOUNTER — Ambulatory Visit (HOSPITAL_COMMUNITY)
Admission: RE | Admit: 2021-02-25 | Discharge: 2021-02-25 | Disposition: A | Discharge status: Home / Self Care | Payer: Medicare HMO | Attending: Vascular Surgery | Admitting: Vascular Surgery

## 2021-02-25 DIAGNOSIS — E1151 Type 2 diabetes mellitus with diabetic peripheral angiopathy without gangrene: Secondary | ICD-10-CM | POA: Diagnosis not present

## 2021-02-25 DIAGNOSIS — I130 Hypertensive heart and chronic kidney disease with heart failure and stage 1 through stage 4 chronic kidney disease, or unspecified chronic kidney disease: Secondary | ICD-10-CM | POA: Diagnosis not present

## 2021-02-25 DIAGNOSIS — N183 Chronic kidney disease, stage 3 unspecified: Secondary | ICD-10-CM | POA: Diagnosis not present

## 2021-02-25 DIAGNOSIS — I739 Peripheral vascular disease, unspecified: Secondary | ICD-10-CM

## 2021-02-25 DIAGNOSIS — Z953 Presence of xenogenic heart valve: Secondary | ICD-10-CM | POA: Insufficient documentation

## 2021-02-25 DIAGNOSIS — I96 Gangrene, not elsewhere classified: Secondary | ICD-10-CM

## 2021-02-25 DIAGNOSIS — Z01810 Encounter for preprocedural cardiovascular examination: Secondary | ICD-10-CM

## 2021-02-25 DIAGNOSIS — I4892 Unspecified atrial flutter: Secondary | ICD-10-CM | POA: Diagnosis not present

## 2021-02-25 DIAGNOSIS — E11621 Type 2 diabetes mellitus with foot ulcer: Secondary | ICD-10-CM | POA: Diagnosis not present

## 2021-02-25 DIAGNOSIS — L97519 Non-pressure chronic ulcer of other part of right foot with unspecified severity: Secondary | ICD-10-CM | POA: Diagnosis not present

## 2021-02-25 DIAGNOSIS — I4891 Unspecified atrial fibrillation: Secondary | ICD-10-CM | POA: Diagnosis not present

## 2021-02-25 DIAGNOSIS — Z91013 Allergy to seafood: Secondary | ICD-10-CM | POA: Diagnosis not present

## 2021-02-25 DIAGNOSIS — M868X7 Other osteomyelitis, ankle and foot: Secondary | ICD-10-CM | POA: Insufficient documentation

## 2021-02-25 DIAGNOSIS — I5022 Chronic systolic (congestive) heart failure: Secondary | ICD-10-CM | POA: Insufficient documentation

## 2021-02-25 DIAGNOSIS — I70235 Atherosclerosis of native arteries of right leg with ulceration of other part of foot: Secondary | ICD-10-CM | POA: Diagnosis not present

## 2021-02-25 DIAGNOSIS — Z7901 Long term (current) use of anticoagulants: Secondary | ICD-10-CM | POA: Insufficient documentation

## 2021-02-25 HISTORY — PX: ABDOMINAL AORTOGRAM W/LOWER EXTREMITY: CATH118223

## 2021-02-25 LAB — POCT I-STAT, CHEM 8
BUN: 56 mg/dL — ABNORMAL HIGH (ref 8–23)
Calcium, Ion: 1.28 mmol/L (ref 1.15–1.40)
Chloride: 108 mmol/L (ref 98–111)
Creatinine, Ser: 1.8 mg/dL — ABNORMAL HIGH (ref 0.61–1.24)
Glucose, Bld: 100 mg/dL — ABNORMAL HIGH (ref 70–99)
HCT: 37 % — ABNORMAL LOW (ref 39.0–52.0)
Hemoglobin: 12.6 g/dL — ABNORMAL LOW (ref 13.0–17.0)
Potassium: 5.5 mmol/L — ABNORMAL HIGH (ref 3.5–5.1)
Sodium: 141 mmol/L (ref 135–145)
TCO2: 25 mmol/L (ref 22–32)

## 2021-02-25 LAB — PROTIME-INR
INR: 1.5 — ABNORMAL HIGH (ref 0.8–1.2)
Prothrombin Time: 17.8 seconds — ABNORMAL HIGH (ref 11.4–15.2)

## 2021-02-25 LAB — GLUCOSE, CAPILLARY: Glucose-Capillary: 85 mg/dL (ref 70–99)

## 2021-02-25 SURGERY — ABDOMINAL AORTOGRAM W/LOWER EXTREMITY
Anesthesia: LOCAL

## 2021-02-25 MED ORDER — MIDAZOLAM HCL 2 MG/2ML IJ SOLN
INTRAMUSCULAR | Status: AC
  Filled 2021-02-25: qty 2

## 2021-02-25 MED ORDER — ACETAMINOPHEN 325 MG PO TABS
650.0000 mg | ORAL_TABLET | ORAL | Status: DC | PRN
  Administered 2021-02-25: 650 mg via ORAL
  Filled 2021-02-25: qty 2

## 2021-02-25 MED ORDER — ASPIRIN EC 81 MG PO TBEC: 81.0000 mg | DELAYED_RELEASE_TABLET | Freq: Every day | ORAL | Status: DC

## 2021-02-25 MED ORDER — SODIUM CHLORIDE 0.9 % IV SOLN: 250.0000 mL | INTRAVENOUS | Status: DC | PRN

## 2021-02-25 MED ORDER — LABETALOL HCL 5 MG/ML IV SOLN: 10.0000 mg | INTRAVENOUS | Status: DC | PRN

## 2021-02-25 MED ORDER — FENTANYL CITRATE (PF) 100 MCG/2ML IJ SOLN
INTRAMUSCULAR | Status: DC | PRN
  Administered 2021-02-25: 25 ug via INTRAVENOUS

## 2021-02-25 MED ORDER — FENTANYL CITRATE (PF) 100 MCG/2ML IJ SOLN
INTRAMUSCULAR | Status: AC
  Filled 2021-02-25: qty 2

## 2021-02-25 MED ORDER — HEPARIN (PORCINE) IN NACL 1000-0.9 UT/500ML-% IV SOLN
INTRAVENOUS | Status: AC
  Filled 2021-02-25: qty 1000

## 2021-02-25 MED ORDER — IODIXANOL 320 MG/ML IV SOLN
INTRAVENOUS | Status: DC | PRN
  Administered 2021-02-25: 65 mL via INTRA_ARTERIAL

## 2021-02-25 MED ORDER — MIDAZOLAM HCL 2 MG/2ML IJ SOLN
INTRAMUSCULAR | Status: DC | PRN
  Administered 2021-02-25: 1 mg via INTRAVENOUS

## 2021-02-25 MED ORDER — LIDOCAINE HCL (PF) 1 % IJ SOLN
INTRAMUSCULAR | Status: AC
  Filled 2021-02-25: qty 30

## 2021-02-25 MED ORDER — ONDANSETRON HCL 4 MG/2ML IJ SOLN: 4.0000 mg | Freq: Four times a day (QID) | INTRAMUSCULAR | Status: DC | PRN

## 2021-02-25 MED ORDER — SODIUM CHLORIDE 0.9% FLUSH: 3.0000 mL | INTRAVENOUS | Status: DC | PRN

## 2021-02-25 MED ORDER — SODIUM CHLORIDE 0.9% FLUSH: 3.0000 mL | Freq: Two times a day (BID) | INTRAVENOUS | Status: DC

## 2021-02-25 MED ORDER — ASPIRIN 81 MG PO CHEW
CHEWABLE_TABLET | ORAL | Status: AC
  Administered 2021-02-25: 81 mg
  Filled 2021-02-25: qty 1

## 2021-02-25 MED ORDER — HEPARIN (PORCINE) IN NACL 1000-0.9 UT/500ML-% IV SOLN
INTRAVENOUS | Status: DC | PRN
  Administered 2021-02-25 (×2): 500 mL

## 2021-02-25 MED ORDER — SODIUM CHLORIDE 0.9 % IV SOLN: INTRAVENOUS | Status: DC

## 2021-02-25 MED ORDER — SODIUM CHLORIDE 0.9 % WEIGHT BASED INFUSION: 1.0000 mL/kg/h | INTRAVENOUS | Status: DC

## 2021-02-25 MED ORDER — LIDOCAINE HCL (PF) 1 % IJ SOLN
INTRAMUSCULAR | Status: DC | PRN
  Administered 2021-02-25: 20 mL

## 2021-02-25 MED ORDER — HYDRALAZINE HCL 20 MG/ML IJ SOLN: 5.0000 mg | INTRAMUSCULAR | Status: DC | PRN

## 2021-02-25 SURGICAL SUPPLY — 12 items
CATH OMNI FLUSH 5F 65CM (CATHETERS) ×1 IMPLANT
DEVICE CLOSURE MYNXGRIP 5F (Vascular Products) ×1 IMPLANT
FILTER CO2 0.2 MICRON (VASCULAR PRODUCTS) ×1 IMPLANT
KIT MICROPUNCTURE NIT STIFF (SHEATH) ×1 IMPLANT
KIT PV (KITS) ×2 IMPLANT
RESERVOIR CO2 (VASCULAR PRODUCTS) ×1 IMPLANT
SET FLUSH CO2 (MISCELLANEOUS) ×1 IMPLANT
SHEATH PINNACLE 5F 10CM (SHEATH) ×1 IMPLANT
SHEATH PROBE COVER 6X72 (BAG) ×1 IMPLANT
TRANSDUCER W/STOPCOCK (MISCELLANEOUS) ×2 IMPLANT
TRAY PV CATH (CUSTOM PROCEDURE TRAY) ×2 IMPLANT
WIRE BENTSON .035X145CM (WIRE) ×1 IMPLANT

## 2021-02-25 NOTE — Op Note (Signed)
DATE OF SERVICE: 02/25/2021  PATIENT:  Ryan [name]  75 y.o. male  PRE-OPERATIVE DIAGNOSIS:  Atherosclerosis of native arteries of bilateral lower extremities causing great toe ulceration  POST-OPERATIVE DIAGNOSIS:  Same  PROCEDURE:   1) US guided left common femoral artery access 2) Aortogram 3) right lower extermity angiogram with third order cannulation (11m total contrast) 4) left lower extremity angiogram 5) conscious sedation (37 minutes)  SURGEON:  Surgeon(s) and Role:    * HCherre Robins MD - Primary  ASSISTANT: none  ANESTHESIA:   local and IV sedation  EBL: min  BLOOD ADMINISTERED:none  DRAINS: none   LOCAL MEDICATIONS USED:  LIDOCAINE   SPECIMEN:  none  COUNTS: confirmed correct.  TOURNIQUET:  None  PATIENT DISPOSITION:  PACU - hemodynamically stable.   Delay start of Pharmacological VTE agent (>24hrs) due to surgical blood loss or risk of bleeding: no  INDICATION FOR PROCEDURE: Ryan TERRELis a 75y.o. male with bilateral toe tip gangrene with non-invasive evidence of peripheral arterial disease. After careful discussion of risks, benefits, and alternatives the patient was offered angiography. The patient understood and wished to proceed.  OPERATIVE FINDINGS:  Unremarkable aortogram Unremarkable iliac vessels  Right lower extremity CFA / PFA / SFA - no flow limiting stenosis Popliteal artery - occluded behind the knee Severe infrageniculate disease Reconstitution of mid peroneal artery coursing to the ankle Pedal vessels fill via collateralization from the peroneal  Right lower extremity CFA / PFA - no flow limiting stenosis SFA / Popliteal artery - diffuse distal SFA / prox pop disease greatest measuring >85%. Below knee popliteal artery appears patent and fills peroneal only runoff to the ankle Imaging below the knee limited by retrograde cannulation  DESCRIPTION OF PROCEDURE: After identification of the patient in the pre-operative  holding area, the patient was transferred to the operating room. The patient was positioned supine on the operating room table. Anesthesia was induced. The groins was prepped and draped in standard fashion. A surgical pause was performed confirming correct patient, procedure, and operative location.  The left groin was anesthetized with subcutaneous injection of 1% lidocaine. Using ultrasound guidance, the left common femoral artery was accessed with micropuncture technique. Fluoroscopy was used to confirm cannulation over the femoral head. Sheathogram was not performed. The 59F sheath was upsized to 42F.   An 035 glidewire advantage was advanced into the distal aorta. Over the wire an omni flush catheter was advanced to the level of L2. Aortogram was performed - see above for details.   The right common iliac artery was selected with the 035 glidewire advantage. The wire was advanced into the common femoral artery. Over the wire the omni flush catheter was advanced into the external iliac artery. Selective angiography was performed - see above for details.   A mynx device was used to close the arteriotomy. Hemostasis was excellent upon completion.  Conscious sedation was administered with the use of IV fentanyl and midazolam under continuous physician and nurse monitoring.  Heart rate, blood pressure, and oxygen saturation were continuously monitored.  Total sedation time was 37 minutes  Upon completion of the case instrument and sharps counts were confirmed correct. The patient was transferred to the PACU in good condition. I was present for all portions of the procedure.  PLAN: needs RSFA - peroneal bypass for limb salvage. Check vein mapping prior to discharge. Will arrange for early next week with myself or Dr. BTrula Slade Will need Coumadin bridge with Lovenox.  Ryan [name]  Cloria Spring, MD [facility] Phone Number: [phone] 02/25/2021 11:50 AM

## 2021-02-25 NOTE — Discharge Instructions (Signed)
Femoral Site Care  This sheet gives you information about how to care for yourself after your procedure. Your health care provider may also give you more specific instructions. If you have problems or questions, contact your health care provider. What can I expect after the procedure? After the procedure, it is common to have:  Bruising that usually fades within 1-2 weeks.  Tenderness at the site. Follow these instructions at home: Wound care  Follow instructions from your health care provider about how to take care of your insertion site. Make sure you: ? Wash your hands with soap and water before you change your bandage (dressing). If soap and water are not available, use hand sanitizer. ? Change your dressing as told by your health care provider. ? Leave stitches (sutures), skin glue, or adhesive strips in place. These skin closures may need to stay in place for 2 weeks or longer. If adhesive strip edges start to loosen and curl up, you may trim the loose edges. Do not remove adhesive strips completely unless your health care provider tells you to do that.  Do not take baths, swim, or use a hot tub until your health care provider approves.  You may shower 24-48 hours after the procedure or as told by your health care provider. ? Gently wash the site with plain soap and water. ? Pat the area dry with a clean towel. ? Do not rub the site. This may cause bleeding.  Do not apply powder or lotion to the site. Keep the site clean and dry.  Check your femoral site every day for signs of infection. Check for: ? Redness, swelling, or pain. ? Fluid or blood. ? Warmth. ? Pus or a bad smell. Activity  For the first 2-3 days after your procedure, or as long as directed: ? Avoid climbing stairs as much as possible. ? Do not squat.  Do not lift anything that is heavier than 10 lb (4.5 kg), or the limit that you are told, until your health care provider says that it is safe.  Rest as  directed. ? Avoid sitting for a long time without moving. Get up to take short walks every 1-2 hours.  Do not drive for 24 hours if you were given a medicine to help you relax (sedative). General instructions  Take over-the-counter and prescription medicines only as told by your health care provider.  Keep all follow-up visits as told by your health care provider. This is important. Contact a health care provider if you have:  A fever or chills.  You have redness, swelling, or pain around your insertion site. Get help right away if:  The catheter insertion area swells very fast.  You pass out.  You suddenly start to sweat or your skin gets clammy.  The catheter insertion area is bleeding, and the bleeding does not stop when you hold steady pressure on the area.  The area near or just beyond the catheter insertion site becomes pale, cool, tingly, or numb. These symptoms may represent a serious problem that is an emergency. Do not wait to see if the symptoms will go away. Get medical help right away. Call your local emergency services (911 in the [place] Do not drive yourself to the hospital. Summary  After the procedure, it is common to have bruising that usually fades within 1-2 weeks.  Check your femoral site every day for signs of infection.  Do not lift anything that is heavier than 10 lb (4.5 kg), or   the limit that you are told, until your health care provider says that it is safe. This information is not intended to replace advice given to you by your health care provider. Make sure you discuss any questions you have with your health care provider. Document Revised: 05/09/2020 Document Reviewed: 05/09/2020 Elsevier Patient Education  2021 [employer]  

## 2021-02-25 NOTE — H&P (Signed)
See below for H&P details. R great toe ulcer in setting of PAD Indicated for angiography with possible intervention Palpable femoral pulses CKD so will use CO2 predominately  Ryan Ryan [name]. [name], MD [facility] Phone Number: [phone] 02/25/2021 9:44 AM             History of Present Illness:  Patient is a 75 y.o. year old Ryan [name] who presents for evaluation of non healing wound.  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  He also had a right great toe ulceration that he was able to heal with wound care around that time.  He now has a GT tip ulcer and a second toe open wound.  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    He is followed by Cardiology with history of history of endocarditis s/p bioprosthetic mitral valve replacement in 2014, atrial fibrillation/flutter, HTN, DM2,PAD (followed by vascular)chronic systolic heart failure, embolic CVA, anticoagulated with coumadin.  He has CKD stage III  SCr down to 1.74 day of d/c (down from peak of 2.4).      Past Medical History:  Diagnosis Date  . AICD (automatic cardioverter/defibrillator) present   . Atrial fibrillation ([place])   . 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 CHF (congestive heart failure) ([place])   . Diabetes mellitus without complication ([place])   . Endocarditis   . Hypertension   . Peripheral vascular disease ([place])   . PVC's (premature ventricular contractions)   . SVT (supraventricular tachycardia)  long RP     Past Surgical History:  Procedure Laterality Date  . ABDOMINAL AORTAGRAM N/A 10/03/2012   Procedure: ABDOMINAL Maxcine Ham;  Surgeon: [name], MD;  Location: [facility] CATH LAB;  Service: Cardiovascular;  Laterality: N/A;  . CORONARY ANGIOGRAM  09/21/2012   Procedure: CORONARY ANGIOGRAM;  Surgeon: [name], MD;  Location: [facility] CATH LAB;  Service: Cardiovascular;;  . EP IMPLANTABLE DEVICE N/A  06/11/2016   Procedure: ICD Implant;  Surgeon: Will Meredith Leeds, MD;  Location: [place] CV LAB;  Service: Cardiovascular;  Laterality: N/A;  . EXTREMITY WIRE/PIN REMOVAL  09/14/2012   Procedure: REMOVAL K-WIRE/PIN EXTREMITY;  Surgeon: [name], MD;  Location: [place];  Service: Orthopedics;  Laterality: Right;  Right Foot  . I & D EXTREMITY  09/14/2012   Procedure: IRRIGATION AND DEBRIDEMENT EXTREMITY;  Surgeon: Tennis Must, MD;  Location: [place];  Service: Orthopedics;  Laterality: Right;  . INTRAOPERATIVE TRANSESOPHAGEAL ECHOCARDIOGRAM  09/26/2012   Procedure: INTRAOPERATIVE TRANSESOPHAGEAL ECHOCARDIOGRAM;  Surgeon: [name], MD;  Location: [facility] OR;  Service: Open Heart Surgery;  Laterality: N/A;  . MITRAL VALVE REPLACEMENT  09/26/2012   Procedure: MITRAL VALVE (MV) REPLACEMENT;  Surgeon: [name], MD;  Location: [place] OR;  Service: Open Heart Surgery;  Laterality: N/A;  . RIGHT HEART CATH N/A 08/29/2020   Procedure: RIGHT HEART CATH;  Surgeon: Larey Dresser, MD;  Location: [place] CV LAB;  Service: Cardiovascular;  Laterality: N/A;  . RIGHT HEART CATHETERIZATION  09/21/2012   Procedure: RIGHT HEART CATH;  Surgeon: [name], MD;  Location: [facility] CATH LAB;  Service: Cardiovascular;;  . RIGHT/LEFT HEART CATH AND CORONARY ANGIOGRAPHY N/A 01/30/2021   Procedure: RIGHT/LEFT HEART CATH AND CORONARY ANGIOGRAPHY;  Surgeon: Larey Dresser, MD;  Location: [place] CV LAB;  Service: Cardiovascular;  Laterality: N/A;  . SVT ABLATION N/A 09/03/2020  Procedure: SVT ABLATION;  Surgeon: Constance Haw, MD;  Location: [place] CV LAB;  Service: Cardiovascular;  Laterality: N/A;  . TEE WITHOUT CARDIOVERSION  09/18/2012   Procedure: TRANSESOPHAGEAL ECHOCARDIOGRAM (TEE);  Surgeon: [name], MD;  Location: [facility] ENDOSCOPY;  Service: Cardiovascular;  Laterality: N/A;    ROS:   General:  No weight loss, Fever, chills  HEENT: No recent headaches, no nasal bleeding, no visual  changes, no sore throat  Neurologic: No dizziness, blackouts, seizures. No recent symptoms of stroke or mini- stroke. No recent episodes of slurred speech, or temporary blindness.  Cardiac: No recent episodes of chest pain/pressure, no shortness of breath at rest.  No shortness of breath with exertion.  Denies history of atrial fibrillation or irregular heartbeat  Vascular: No history of rest pain in feet.  No history of claudication.  No history of non-healing ulcer, No history of DVT   Pulmonary: No home oxygen, no productive cough, no hemoptysis,  No asthma or wheezing  Musculoskeletal:  '[ ]'$  Arthritis, '[ ]'$  Low back pain,  '[ ]'$  Joint pain  Hematologic:No history of hypercoagulable state.  No history of easy bleeding.  No history of anemia  Gastrointestinal: No hematochezia or melena,  No gastroesophageal reflux, no trouble swallowing  Urinary: '[ ]'$  chronic Kidney disease, '[ ]'$  on HD - '[ ]'$  MWF or '[ ]'$  TTHS, '[ ]'$  Burning with urination, '[ ]'$  Frequent urination, '[ ]'$  Difficulty urinating;   Skin: No rashes  Psychological: No history of anxiety,  No history of depression  Social History Social History   Tobacco Use  . Smoking status: Never Smoker  . Smokeless tobacco: Never Used  Vaping Use  . Vaping Use: Never used  Substance Use Topics  . Alcohol use: No    Alcohol/week: 0.0 standard drinks  . Drug use: No    Family History Family History  Problem Relation Age of Onset  . Hypertension Mother   . Hypertension Father     Allergies  Allergies  Allergen Reactions  . Geralyn Flash [Fish Allergy] Nausea And Vomiting     Current Facility-Administered Medications  Medication Dose Route Frequency Provider Last Rate Last Admin  . 0.9 %  sodium chloride infusion   Intravenous Continuous [name], MD 100 mL/hr at 02/25/21 0841 New Bag at 02/25/21 0841    Physical Examination  Vitals:   02/25/21 0858 02/25/21 0900  BP:  129/74  Pulse:  70  Resp:  18  Temp:  98.5 F (36.9 C)   TempSrc:  Oral  SpO2:  100%  Weight: 70.2 kg   Height: 6' (1.829 m)     Body mass index is 20.99 kg/m.  General:  Alert and oriented, no acute distress HEENT: Normal Neck: No bruit or JVD Pulmonary: Clear to auscultation bilaterally Cardiac: Regular Rate and Rhythm  Abdomen: Soft, non-tender, non-distended, no mass, no scars Skin: No rash.  Right GT tip non healing ulcer and kissing ulcer on the right second toe between the GT and second toe.  Musculoskeletal: No deformity or edema  Neurologic: Upper and lower extremity motor 5/5 and symmetric  DATA:  ABI Findings:  +---------+------------------+-----+----------+----------------------------  ----+  Right  Rt Pressure (mmHg)IndexWaveform Comment               +---------+------------------+-----+----------+----------------------------  ----+  Brachial 132                                   +---------+------------------+-----+----------+----------------------------  ----+  PTA   >230          monophasic                   +---------+------------------+-----+----------+----------------------------  ----+  DP    43           monophasic                   +---------+------------------+-----+----------+----------------------------  ----+  Great Toe                 Unable to obtain  PPG/pressure                          due to wound and bandaging      +---------+------------------+-----+----------+----------------------------  ----+   +---------+------------------+-----+----------+-------+  Left   Lt Pressure (mmHg)IndexWaveform Comment  +---------+------------------+-----+----------+-------+  Brachial 128                      +---------+------------------+-----+----------+-------+  PTA   >230           monophasic      +---------+------------------+-----+----------+-------+  DP    >230          monophasic      +---------+------------------+-----+----------+-------+  Great Toe24        0.18            +---------+------------------+-----+----------+-------+   +-------+-----------+----------------+-----------------+------------+  ABI/TBIToday's ABIToday's TBI   Previous ABI   Previous TBI  +-------+-----------+----------------+-----------------+------------+  Right 0.33    Unable to obtain0.86 (unreliable)0        +-------+-----------+----------------+-----------------+------------+  Left  [place]     0.18      [place]        0.44      +-------+-----------+----------------+-----------------+------------+      Summary:  Right: Resting right ankle-brachial index indicates severe right lower  extremity arterial disease.   Left: Resting left ankle-brachial index indicates noncompressible left  lower extremity arteries. The left toe-brachial index is abnormal.    ASSESSMENT/Plan PAD with chronic right GT ulcer on/off ABI's monophasic flow with calcification.   History of angiography 2014  which revealed an occluded right popliteal artery. We had been managing his wound nonoperatively. Foot x ray taken at [facility] office reveals osteomyelitis right second toe. He denise symptoms of rest pain or claudication.  Pre angiogram planning: Coumadin transition with bioprosthetic mitral valve replacement in 2014 and  atrial fibrillation/flutter. CKD stage III plan for Co2      Cherre Robins PA-C [facility] [place] Office: [phone]  MD on call Donzetta Matters

## 2021-02-26 ENCOUNTER — Other Ambulatory Visit: Payer: Self-pay

## 2021-02-26 ENCOUNTER — Encounter (HOSPITAL_COMMUNITY): Payer: Self-pay | Admitting: Vascular Surgery

## 2021-02-26 MED ORDER — ENOXAPARIN SODIUM 80 MG/0.8ML IJ SOSY: 80.0000 mg | PREFILLED_SYRINGE | Freq: Two times a day (BID) | INTRAMUSCULAR | 0 refills | Status: DC

## 2021-03-02 ENCOUNTER — Ambulatory Visit (INDEPENDENT_AMBULATORY_CARE_PROVIDER_SITE_OTHER): Payer: Medicare HMO

## 2021-03-02 ENCOUNTER — Telehealth: Payer: Self-pay

## 2021-03-02 ENCOUNTER — Telehealth: Payer: Self-pay | Admitting: Pharmacist

## 2021-03-02 DIAGNOSIS — Z95810 Presence of automatic (implantable) cardiac defibrillator: Secondary | ICD-10-CM | POA: Diagnosis not present

## 2021-03-02 DIAGNOSIS — I5042 Chronic combined systolic (congestive) and diastolic (congestive) heart failure: Secondary | ICD-10-CM | POA: Diagnosis not present

## 2021-03-02 MED ORDER — ENOXAPARIN SODIUM 80 MG/0.8ML IJ SOSY
PREFILLED_SYRINGE | INTRAMUSCULAR | 0 refills | Status: DC
Start: 2021-03-02 — End: 2021-04-03

## 2021-03-02 NOTE — Progress Notes (Signed)
EPIC Encounter for ICM Monitoring  Patient Name: Ryan [name] is a 75 y.o. male Date: 03/02/2021 Primary Care Physican: [name], MD Primary Cardiologist: [place] Electrophysiologist: [name] 01/21/2021 Weight: 153.8 lbs  03/02/2021 Weight: 159 lbs   VT-NS (>4 beats, >200 bpm)    2 Time in AT/AF <0.1 hr/day (<0.1%) Longest AT/AF 5 minutes                                                            Spoke with patient and reports swelling in feet and weight gain of approximately 4 lbs in the last couple of weeks.  Pt scheduled for Femoral Artery Bypass on 03/06/2021.         Optivol thoracic impedance suggesting possible fluid accumulation since 01/26/2021.   Fluid index > normal threshold since 02/03/2021.   Prescribed:  Furosemide 40 mg take 1 tablet by mouth daily as needed for edema Spironolactone 25 mg Take 0.5 tablets (12.5 mg total) by mouth daily.   Labs: 02/25/2021 Creatinine 1.80, BUN 56, Potassium 5.5, Sodium 141 01/26/2021 Creatinine 1.70, BUN 37, Potassium 4.6, Sodium 140, GFR 41  01/22/2021 Creatinine 1.59, BUN 40, Potassium 4.8, Sodium 137  A complete set of results can be found in Results Review.   Recommendations:  Pt taking PRN Furosemide x 2 days.    Follow-up plan: ICM clinic phone appointment on 03/16/2021 to recheck fluid levels.   91 day device clinic remote transmission 03/16/2021.     EP/Cardiology Office Visits:  03/04/2021 with Dr Aundra Dubin. 04/22/2021 with Richardson Dopp, PA.  05/26/2021 with Dr [name]     Copy of ICM check sent to Dr. [name].    3 month ICM trend: 03/02/2021.    1 Year ICM trend:       Rosalene Billings, RN 03/02/2021 2:07 PM

## 2021-03-02 NOTE — Telephone Encounter (Signed)
Remote ICM transmission received.  Attempted call to patient regarding ICM remote transmission and no answer.  

## 2021-03-02 NOTE — Telephone Encounter (Signed)
Patient now to have another procedure on 6/17. He resumed his warfarin after procedure on 6/8. On lovenox. I have asked his to stop taking his warfarin. He will need to continue his lovenox injections. Last injection Thurs at 9AM. Resume lovenox injection Sat AM. He will most likely be in the hospital and this will be up to MD. He has 6 syringes left. Has not given yet today. I will call in another rx for lovenox '80mg'$  BID. I have rescheduled his coumadin clinic apt from tomorrow till 6/21.

## 2021-03-03 ENCOUNTER — Other Ambulatory Visit: Payer: Self-pay

## 2021-03-03 ENCOUNTER — Encounter (HOSPITAL_COMMUNITY): Payer: Self-pay

## 2021-03-03 ENCOUNTER — Encounter: Payer: Self-pay | Admitting: Cardiology

## 2021-03-03 ENCOUNTER — Encounter (HOSPITAL_COMMUNITY)
Admission: RE | Admit: 2021-03-03 | Discharge: 2021-03-03 | Disposition: A | Discharge status: Home / Self Care | Payer: Medicare HMO | Source: Ambulatory Visit | Attending: Vascular Surgery | Admitting: Vascular Surgery

## 2021-03-03 ENCOUNTER — Other Ambulatory Visit: Payer: Self-pay | Admitting: Physician Assistant

## 2021-03-03 DIAGNOSIS — I4892 Unspecified atrial flutter: Secondary | ICD-10-CM | POA: Diagnosis present

## 2021-03-03 DIAGNOSIS — I429 Cardiomyopathy, unspecified: Secondary | ICD-10-CM | POA: Diagnosis not present

## 2021-03-03 DIAGNOSIS — R531 Weakness: Secondary | ICD-10-CM | POA: Diagnosis not present

## 2021-03-03 DIAGNOSIS — E11621 Type 2 diabetes mellitus with foot ulcer: Secondary | ICD-10-CM | POA: Diagnosis present

## 2021-03-03 DIAGNOSIS — L97519 Non-pressure chronic ulcer of other part of right foot with unspecified severity: Secondary | ICD-10-CM | POA: Diagnosis present

## 2021-03-03 DIAGNOSIS — R34 Anuria and oliguria: Secondary | ICD-10-CM | POA: Diagnosis present

## 2021-03-03 DIAGNOSIS — I70201 Unspecified atherosclerosis of native arteries of extremities, right leg: Secondary | ICD-10-CM | POA: Diagnosis present

## 2021-03-03 DIAGNOSIS — R5381 Other malaise: Secondary | ICD-10-CM | POA: Diagnosis not present

## 2021-03-03 DIAGNOSIS — N179 Acute kidney failure, unspecified: Secondary | ICD-10-CM | POA: Diagnosis not present

## 2021-03-03 DIAGNOSIS — I70291 Other atherosclerosis of native arteries of extremities, right leg: Secondary | ICD-10-CM | POA: Diagnosis not present

## 2021-03-03 DIAGNOSIS — E1151 Type 2 diabetes mellitus with diabetic peripheral angiopathy without gangrene: Secondary | ICD-10-CM | POA: Diagnosis not present

## 2021-03-03 DIAGNOSIS — Z8249 Family history of ischemic heart disease and other diseases of the circulatory system: Secondary | ICD-10-CM | POA: Diagnosis not present

## 2021-03-03 DIAGNOSIS — Z20822 Contact with and (suspected) exposure to covid-19: Secondary | ICD-10-CM | POA: Diagnosis present

## 2021-03-03 DIAGNOSIS — M86171 Other acute osteomyelitis, right ankle and foot: Secondary | ICD-10-CM | POA: Diagnosis not present

## 2021-03-03 DIAGNOSIS — Z7901 Long term (current) use of anticoagulants: Secondary | ICD-10-CM | POA: Diagnosis not present

## 2021-03-03 DIAGNOSIS — I4891 Unspecified atrial fibrillation: Secondary | ICD-10-CM | POA: Diagnosis not present

## 2021-03-03 DIAGNOSIS — I493 Ventricular premature depolarization: Secondary | ICD-10-CM | POA: Diagnosis present

## 2021-03-03 DIAGNOSIS — I77810 Thoracic aortic ectasia: Secondary | ICD-10-CM | POA: Diagnosis present

## 2021-03-03 DIAGNOSIS — I471 Supraventricular tachycardia: Secondary | ICD-10-CM | POA: Diagnosis present

## 2021-03-03 DIAGNOSIS — Z95810 Presence of automatic (implantable) cardiac defibrillator: Secondary | ICD-10-CM | POA: Diagnosis not present

## 2021-03-03 DIAGNOSIS — I5022 Chronic systolic (congestive) heart failure: Secondary | ICD-10-CM | POA: Diagnosis present

## 2021-03-03 DIAGNOSIS — I9589 Other hypotension: Secondary | ICD-10-CM | POA: Diagnosis not present

## 2021-03-03 DIAGNOSIS — Z953 Presence of xenogenic heart valve: Secondary | ICD-10-CM | POA: Diagnosis not present

## 2021-03-03 DIAGNOSIS — I509 Heart failure, unspecified: Secondary | ICD-10-CM | POA: Diagnosis not present

## 2021-03-03 DIAGNOSIS — Z8679 Personal history of other diseases of the circulatory system: Secondary | ICD-10-CM | POA: Diagnosis not present

## 2021-03-03 DIAGNOSIS — Z7401 Bed confinement status: Secondary | ICD-10-CM | POA: Diagnosis not present

## 2021-03-03 DIAGNOSIS — I272 Pulmonary hypertension, unspecified: Secondary | ICD-10-CM | POA: Diagnosis present

## 2021-03-03 DIAGNOSIS — I739 Peripheral vascular disease, unspecified: Secondary | ICD-10-CM | POA: Diagnosis not present

## 2021-03-03 DIAGNOSIS — Z79899 Other long term (current) drug therapy: Secondary | ICD-10-CM | POA: Diagnosis not present

## 2021-03-03 DIAGNOSIS — I130 Hypertensive heart and chronic kidney disease with heart failure and stage 1 through stage 4 chronic kidney disease, or unspecified chronic kidney disease: Secondary | ICD-10-CM | POA: Diagnosis present

## 2021-03-03 DIAGNOSIS — I480 Paroxysmal atrial fibrillation: Secondary | ICD-10-CM | POA: Diagnosis present

## 2021-03-03 DIAGNOSIS — I420 Dilated cardiomyopathy: Secondary | ICD-10-CM | POA: Diagnosis present

## 2021-03-03 DIAGNOSIS — I5042 Chronic combined systolic (congestive) and diastolic (congestive) heart failure: Secondary | ICD-10-CM | POA: Diagnosis not present

## 2021-03-03 DIAGNOSIS — Z8673 Personal history of transient ischemic attack (TIA), and cerebral infarction without residual deficits: Secondary | ICD-10-CM | POA: Diagnosis not present

## 2021-03-03 DIAGNOSIS — M869 Osteomyelitis, unspecified: Secondary | ICD-10-CM | POA: Diagnosis present

## 2021-03-03 DIAGNOSIS — D62 Acute posthemorrhagic anemia: Secondary | ICD-10-CM | POA: Diagnosis not present

## 2021-03-03 DIAGNOSIS — Z01812 Encounter for preprocedural laboratory examination: Secondary | ICD-10-CM | POA: Insufficient documentation

## 2021-03-03 DIAGNOSIS — E119 Type 2 diabetes mellitus without complications: Secondary | ICD-10-CM | POA: Diagnosis not present

## 2021-03-03 DIAGNOSIS — N183 Chronic kidney disease, stage 3 unspecified: Secondary | ICD-10-CM | POA: Diagnosis present

## 2021-03-03 DIAGNOSIS — M1990 Unspecified osteoarthritis, unspecified site: Secondary | ICD-10-CM | POA: Diagnosis not present

## 2021-03-03 DIAGNOSIS — I5082 Biventricular heart failure: Secondary | ICD-10-CM | POA: Diagnosis present

## 2021-03-03 DIAGNOSIS — D649 Anemia, unspecified: Secondary | ICD-10-CM | POA: Diagnosis not present

## 2021-03-03 DIAGNOSIS — I7092 Chronic total occlusion of artery of the extremities: Secondary | ICD-10-CM | POA: Diagnosis not present

## 2021-03-03 DIAGNOSIS — I70235 Atherosclerosis of native arteries of right leg with ulceration of other part of foot: Secondary | ICD-10-CM | POA: Diagnosis not present

## 2021-03-03 DIAGNOSIS — Z9889 Other specified postprocedural states: Secondary | ICD-10-CM | POA: Diagnosis not present

## 2021-03-03 DIAGNOSIS — D638 Anemia in other chronic diseases classified elsewhere: Secondary | ICD-10-CM | POA: Diagnosis not present

## 2021-03-03 DIAGNOSIS — Z7984 Long term (current) use of oral hypoglycemic drugs: Secondary | ICD-10-CM | POA: Diagnosis not present

## 2021-03-03 DIAGNOSIS — I358 Other nonrheumatic aortic valve disorders: Secondary | ICD-10-CM | POA: Diagnosis present

## 2021-03-03 DIAGNOSIS — E1169 Type 2 diabetes mellitus with other specified complication: Secondary | ICD-10-CM | POA: Diagnosis present

## 2021-03-03 DIAGNOSIS — I70239 Atherosclerosis of native arteries of right leg with ulceration of unspecified site: Secondary | ICD-10-CM | POA: Diagnosis not present

## 2021-03-03 DIAGNOSIS — E1122 Type 2 diabetes mellitus with diabetic chronic kidney disease: Secondary | ICD-10-CM | POA: Diagnosis present

## 2021-03-03 HISTORY — DX: Unspecified osteoarthritis, unspecified site: M19.90

## 2021-03-03 LAB — CBC
HCT: 39.3 % (ref 39.0–52.0)
Hemoglobin: 11.8 g/dL — ABNORMAL LOW (ref 13.0–17.0)
MCH: 24.3 pg — ABNORMAL LOW (ref 26.0–34.0)
MCHC: 30 g/dL (ref 30.0–36.0)
MCV: 81 fL (ref 80.0–100.0)
Platelets: 116 10*3/uL — ABNORMAL LOW (ref 150–400)
RBC: 4.85 MIL/uL (ref 4.22–5.81)
RDW: 17.6 % — ABNORMAL HIGH (ref 11.5–15.5)
WBC: 6.6 10*3/uL (ref 4.0–10.5)
nRBC: 0 % (ref 0.0–0.2)

## 2021-03-03 LAB — COMPREHENSIVE METABOLIC PANEL
ALT: 46 U/L — ABNORMAL HIGH (ref 0–44)
AST: 55 U/L — ABNORMAL HIGH (ref 15–41)
Albumin: 3.4 g/dL — ABNORMAL LOW (ref 3.5–5.0)
Alkaline Phosphatase: 90 U/L (ref 38–126)
Anion gap: 10 (ref 5–15)
BUN: 37 mg/dL — ABNORMAL HIGH (ref 8–23)
CO2: 19 mmol/L — ABNORMAL LOW (ref 22–32)
Calcium: 9.1 mg/dL (ref 8.9–10.3)
Chloride: 110 mmol/L (ref 98–111)
Creatinine, Ser: 1.91 mg/dL — ABNORMAL HIGH (ref 0.61–1.24)
GFR, Estimated: 36 mL/min — ABNORMAL LOW (ref >60)
Glucose, Bld: 97 mg/dL (ref 70–99)
Potassium: 4.7 mmol/L (ref 3.5–5.1)
Sodium: 139 mmol/L (ref 135–145)
Total Bilirubin: 1 mg/dL (ref 0.3–1.2)
Total Protein: 6.3 g/dL — ABNORMAL LOW (ref 6.5–8.1)

## 2021-03-03 LAB — PROTIME-INR
INR: 1.5 — ABNORMAL HIGH (ref 0.8–1.2)
Prothrombin Time: 18 seconds — ABNORMAL HIGH (ref 11.4–15.2)

## 2021-03-03 LAB — URINALYSIS, ROUTINE W REFLEX MICROSCOPIC
Bacteria, UA: NONE SEEN
Bilirubin Urine: NEGATIVE
Glucose, UA: >=500 mg/dL — AB
Hgb urine dipstick: NEGATIVE
Ketones, ur: NEGATIVE mg/dL
Leukocytes,Ua: NEGATIVE
Nitrite: NEGATIVE
Protein, ur: NEGATIVE mg/dL
Specific Gravity, Urine: 1.014 (ref 1.005–1.030)
pH: 5 (ref 5.0–8.0)

## 2021-03-03 LAB — SURGICAL PCR SCREEN
MRSA, PCR: NEGATIVE
Staphylococcus aureus: NEGATIVE

## 2021-03-03 LAB — APTT: aPTT: 38 seconds — ABNORMAL HIGH (ref 24–36)

## 2021-03-03 LAB — GLUCOSE, CAPILLARY: Glucose-Capillary: 93 mg/dL (ref 70–99)

## 2021-03-03 LAB — SARS CORONAVIRUS 2 (TAT 6-24 HRS): SARS Coronavirus 2: NEGATIVE

## 2021-03-03 NOTE — Progress Notes (Signed)
Message sent to request device orders from clinic for surgery scheduled on 03/06/21.

## 2021-03-03 NOTE — Pre-Procedure Instructions (Signed)
Surgical Instructions    Your procedure is scheduled on Friday June 17th.  Report to [facility] Main Entrance "A" at 08:00 A.M., then check in with the Admitting office.  Call this number if you have problems the morning of surgery:  [phone]   If you have any questions prior to your surgery date call [phone]: Open Monday-Friday 8am-4pm    Remember:  Do not eat or drink after midnight the night before your surgery     Take these medicines the morning of surgery with A SIP OF WATER  acetaminophen (TYLENOL)- If needed  amiodarone (PACERONE)  HYDROcodone-acetaminophen (NORCO/VICODIN)- If needed  metoprolol succinate (TOPROL-XL)   pantoprazole (PROTONIX)   tamsulosin (FLOMAX)  As of today, STOP taking any Aspirin (unless otherwise instructed by your surgeon) Aleve, Naproxen, Ibuprofen, Motrin, Advil, Goody's, BC's, all herbal medications, fish oil, and all vitamins.  Follow your surgeons's instructions on when to stop taking Coumadin. If you have not received instructions please contact your surgeon. Per discussion at PAT appointment you stated your surgeon instructed you to stop taking Coumadin on 03/01/21.  Please follow your surgeons's instructions on when to stop Lovenox. Per discussion at PAT appointment you stated surgeon instructed you to continue enoxaparin (LOVENOX) through 03/05/21. Last dose 03/05/21 at 9 am.    WHAT DO I DO ABOUT MY DIABETES MEDICATION?   Do not take oral diabetes medicines (pills) the morning of surgery. Do not take empagliflozin (JARDIANCE) the day before surgery or the morning of surgery.  The day of surgery, do not take other diabetes injectables, including Byetta (exenatide), Bydureon (exenatide ER), Victoza (liraglutide), or Trulicity (dulaglutide).  HOW TO MANAGE YOUR DIABETES BEFORE AND AFTER SURGERY  Why is it important to control my blood sugar before and after surgery? Improving blood sugar levels before and after surgery helps  healing and can limit problems. A way of improving blood sugar control is eating a healthy diet by:  Eating less sugar and carbohydrates  Increasing activity/exercise  Talking with your doctor about reaching your blood sugar goals High blood sugars (greater than 180 mg/dL) can raise your risk of infections and slow your recovery, so you will need to focus on controlling your diabetes during the weeks before surgery. Make sure that the doctor who takes care of your diabetes knows about your planned surgery including the date and location.  How do I manage my blood sugar before surgery? Check your blood sugar at least 4 times a day, starting 2 days before surgery, to make sure that the level is not too high or low.  Check your blood sugar the morning of your surgery when you wake up and every 2 hours until you get to the Short Stay unit.  If your blood sugar is less than 70 mg/dL, you will need to treat for low blood sugar: Do not take insulin. Treat a low blood sugar (less than 70 mg/dL) with  cup of clear juice (cranberry or apple), 4 glucose tablets, OR glucose gel. Recheck blood sugar in 15 minutes after treatment (to make sure it is greater than 70 mg/dL). If your blood sugar is not greater than 70 mg/dL on recheck, call [phone] for further instructions. Report your blood sugar to the short stay nurse when you get to Short Stay.  If you are admitted to the hospital after surgery: Your blood sugar will be checked by the staff and you will probably be given insulin after surgery (instead of oral diabetes medicines) to make sure  you have good blood sugar levels. The goal for blood sugar control after surgery is 80-180 mg/dL.                     Do NOT Smoke (Tobacco/Vaping) or drink Alcohol 24 hours prior to your procedure.  If you use a CPAP at night, you may bring all equipment for your overnight stay.   Contacts, glasses, piercing's, hearing aid's, dentures or partials may not be  worn into surgery, please bring cases for these belongings.    For patients admitted to the hospital, discharge time will be determined by your treatment team.   Patients discharged the day of surgery will not be allowed to drive home, and someone needs to stay with them for 24 hours.    Special instructions:   [place]- Preparing For Surgery  Before surgery, you can play an important role. Because skin is not sterile, your skin needs to be as free of germs as possible. You can reduce the number of germs on your skin by washing with CHG (chlorahexidine gluconate) Soap before surgery.  CHG is an antiseptic cleaner which kills germs and bonds with the skin to continue killing germs even after washing.    Oral Hygiene is also important to reduce your risk of infection.  Remember - BRUSH YOUR TEETH THE MORNING OF SURGERY WITH YOUR REGULAR TOOTHPASTE  Please do not use if you have an allergy to CHG or antibacterial soaps. If your skin becomes reddened/irritated stop using the CHG.  Do not shave (including legs and underarms) for at least 48 hours prior to first CHG shower. It is OK to shave your face.  Please follow these instructions carefully.   Shower the NIGHT BEFORE SURGERY and the MORNING OF SURGERY  If you chose to wash your hair, wash your hair first as usual with your normal shampoo.  After you shampoo, rinse your hair and body thoroughly to remove the shampoo.  Use CHG Soap as you would any other liquid soap. You can apply CHG directly to the skin and wash gently with a scrungie or a clean washcloth.   Apply the CHG Soap to your body ONLY FROM THE NECK DOWN.  Do not use on open wounds or open sores. Avoid contact with your eyes, ears, mouth and genitals (private parts). Wash Face and genitals (private parts)  with your normal soap.   Wash thoroughly, paying special attention to the area where your surgery will be performed.  Thoroughly rinse your body with warm water from the  neck down.  DO NOT shower/wash with your normal soap after using and rinsing off the CHG Soap.  Pat yourself dry with a CLEAN TOWEL.  Wear CLEAN PAJAMAS to bed the night before surgery  Place CLEAN SHEETS on your bed the night before your surgery  DO NOT SLEEP WITH PETS.   Day of Surgery: Shower with CHG soap. Do not wear jewelry. Do not wear lotions, powders, colognes, or deodorant. Do not shave 48 hours prior to surgery.  Men may shave face and neck. Do not bring valuables to the hospital. [facility] is not responsible for any belongings or valuables. Wear Clean/Comfortable clothing the morning of surgery Remember to brush your teeth WITH YOUR REGULAR TOOTHPASTE.   Please read over the following fact sheets that you were given.

## 2021-03-03 NOTE — Progress Notes (Signed)
PERIOPERATIVE PRESCRIPTION FOR IMPLANTED CARDIAC DEVICE PROGRAMMING   Patient Information: Name:Ryan [name], Ryan [name]  DOB: 1945-10-12  MRN: [id number] Planned Procedure: Right Superficial Femoral Artery -Peroneal Bypass  Surgeon: Dr. Jamelle Haring  Date of Procedure:  Friday March 06, 2021  Cautery will be used. Yes  Position during surgery:  Supine   Please send documentation back to:  [facility] (Fax # [phone])   Karmen Bongo, RN  03/03/2021 11:25 AM        Device Information:   Clinic EP Physician:   Allegra Lai, MD Device Type:  Defibrillator Manufacturer and Phone #:  [employer]: [phone] Pacemaker Dependent?:  No Date of Last Device Check:  03/02/2021        Normal Device Function?:  Yes     Electrophysiologist's Recommendations:   Have magnet available. Provide continuous ECG monitoring when magnet is used or reprogramming is to be performed.  Procedure may interfere with device function.  Magnet should be placed over device during procedure.  Per Device Clinic Standing Orders, Drake Leach  03/03/2021 12:23 PM

## 2021-03-03 NOTE — Progress Notes (Signed)
Device orders received. Secure Message sent to Gae Dry with [employer] to make him aware of orders.

## 2021-03-03 NOTE — Progress Notes (Addendum)
PCP - Dr. [name] Cardiologist - Dr. Loralie Champagne  PPM/ICD - ICD Device Orders - Device Information:   Clinic EP Physician:   Allegra Lai, MD Device Type:  Defibrillator Manufacturer and Phone #:  [employer]: [phone] Pacemaker Dependent?:  No Date of Last Device Check:  03/02/2021        Normal Device Function?:  Yes     Electrophysiologist's Recommendations:   Have magnet available. Provide continuous ECG monitoring when magnet is used or reprogramming is to be performed.  Procedure may interfere with device function.  Magnet should be placed over device during procedure.   Per Device Clinic Standing Orders, Drake Leach  03/03/2021 12:23 PM  Rep Notified - Gae Dry with [employer]. Ronalee Belts responded to message stating "the best is use of magnet on this, their orders are too ambiguous."  Chest x-ray - 08/25/20 EKG - 01/22/21 Stress Test - 01/13/21 ECHO - 08/26/20 Cardiac Cath - 01/30/21  Sleep Study - denies CPAP - n/a  Fasting Blood Sugar - 93 Checks Blood Sugar: Patient states he does not check his blood sugar at home. States the only time he checks it is when he at a doctor's appointment.   Blood Thinner Instructions: Note in chart on 03/02/21 states patient is to continue Lovenox injections. Last dose Thursday 6/16 at 9 am. Patient confirms he is aware of this.  Patient states last dose of Coumadin was on Sunday 03/01/21.    COVID TEST- 03/03/21. Pending   Anesthesia review: Yes. Cardiac History. ICD  Patient denies shortness of breath, fever, cough and chest pain at PAT appointment   All instructions explained to the patient, with a verbal understanding of the material. Patient agrees to go over the instructions while at home for a better understanding. Patient also instructed to self quarantine after being tested for COVID-19. The opportunity to ask questions was provided.

## 2021-03-04 ENCOUNTER — Ambulatory Visit (HOSPITAL_COMMUNITY)
Admission: RE | Admit: 2021-03-04 | Discharge: 2021-03-04 | Disposition: A | Discharge status: Home / Self Care | Payer: Medicare HMO | Source: Ambulatory Visit | Attending: Vascular Surgery | Admitting: Vascular Surgery

## 2021-03-04 ENCOUNTER — Encounter (HOSPITAL_COMMUNITY): Payer: Self-pay | Admitting: Cardiology

## 2021-03-04 ENCOUNTER — Encounter (HOSPITAL_COMMUNITY): Payer: Self-pay

## 2021-03-04 ENCOUNTER — Other Ambulatory Visit (HOSPITAL_COMMUNITY): Payer: Medicare HMO

## 2021-03-04 ENCOUNTER — Ambulatory Visit (HOSPITAL_BASED_OUTPATIENT_CLINIC_OR_DEPARTMENT_OTHER)
Admission: RE | Admit: 2021-03-04 | Discharge: 2021-03-04 | Disposition: A | Discharge status: Home / Self Care | Payer: Medicare HMO | Source: Ambulatory Visit | Attending: Cardiology | Admitting: Cardiology

## 2021-03-04 VITALS — BP 110/60 | HR 63 | Wt 161.0 lb

## 2021-03-04 DIAGNOSIS — I130 Hypertensive heart and chronic kidney disease with heart failure and stage 1 through stage 4 chronic kidney disease, or unspecified chronic kidney disease: Secondary | ICD-10-CM | POA: Insufficient documentation

## 2021-03-04 DIAGNOSIS — Z8249 Family history of ischemic heart disease and other diseases of the circulatory system: Secondary | ICD-10-CM | POA: Insufficient documentation

## 2021-03-04 DIAGNOSIS — E11621 Type 2 diabetes mellitus with foot ulcer: Secondary | ICD-10-CM | POA: Insufficient documentation

## 2021-03-04 DIAGNOSIS — Z7901 Long term (current) use of anticoagulants: Secondary | ICD-10-CM | POA: Diagnosis not present

## 2021-03-04 DIAGNOSIS — I471 Supraventricular tachycardia: Secondary | ICD-10-CM | POA: Diagnosis not present

## 2021-03-04 DIAGNOSIS — N183 Chronic kidney disease, stage 3 unspecified: Secondary | ICD-10-CM | POA: Diagnosis not present

## 2021-03-04 DIAGNOSIS — Z8673 Personal history of transient ischemic attack (TIA), and cerebral infarction without residual deficits: Secondary | ICD-10-CM | POA: Insufficient documentation

## 2021-03-04 DIAGNOSIS — L97519 Non-pressure chronic ulcer of other part of right foot with unspecified severity: Secondary | ICD-10-CM | POA: Diagnosis not present

## 2021-03-04 DIAGNOSIS — E1122 Type 2 diabetes mellitus with diabetic chronic kidney disease: Secondary | ICD-10-CM | POA: Diagnosis not present

## 2021-03-04 DIAGNOSIS — Z79899 Other long term (current) drug therapy: Secondary | ICD-10-CM | POA: Insufficient documentation

## 2021-03-04 DIAGNOSIS — I480 Paroxysmal atrial fibrillation: Secondary | ICD-10-CM | POA: Diagnosis not present

## 2021-03-04 DIAGNOSIS — I272 Pulmonary hypertension, unspecified: Secondary | ICD-10-CM | POA: Insufficient documentation

## 2021-03-04 DIAGNOSIS — I5082 Biventricular heart failure: Secondary | ICD-10-CM | POA: Diagnosis not present

## 2021-03-04 DIAGNOSIS — I5042 Chronic combined systolic (congestive) and diastolic (congestive) heart failure: Secondary | ICD-10-CM | POA: Diagnosis not present

## 2021-03-04 DIAGNOSIS — Z9889 Other specified postprocedural states: Secondary | ICD-10-CM

## 2021-03-04 DIAGNOSIS — Z953 Presence of xenogenic heart valve: Secondary | ICD-10-CM | POA: Insufficient documentation

## 2021-03-04 DIAGNOSIS — Z95810 Presence of automatic (implantable) cardiac defibrillator: Secondary | ICD-10-CM | POA: Insufficient documentation

## 2021-03-04 DIAGNOSIS — E1151 Type 2 diabetes mellitus with diabetic peripheral angiopathy without gangrene: Secondary | ICD-10-CM | POA: Diagnosis not present

## 2021-03-04 DIAGNOSIS — Z7984 Long term (current) use of oral hypoglycemic drugs: Secondary | ICD-10-CM | POA: Diagnosis not present

## 2021-03-04 LAB — ECHOCARDIOGRAM COMPLETE
Area-P 1/2: 2.8 cm2
Calc EF: 41.1 %
MV VTI: 1.04 cm2
S' Lateral: 3.7 cm
Single Plane A2C EF: 43.4 %
Single Plane A4C EF: 39.6 %

## 2021-03-04 MED ORDER — METOPROLOL SUCCINATE ER 50 MG PO TB24: 50.0000 mg | ORAL_TABLET | Freq: Every day | ORAL | 3 refills | Status: DC

## 2021-03-04 MED ORDER — METOPROLOL SUCCINATE ER 25 MG PO TB24: 25.0000 mg | ORAL_TABLET | Freq: Every day | ORAL | 3 refills | Status: DC

## 2021-03-04 NOTE — Progress Notes (Signed)
ReDS Vest / Clip - 03/04/21 1000       ReDS Vest / Clip   Station Marker C    Ruler Value 26    ReDS Value Range Low volume    ReDS Actual Value 30

## 2021-03-04 NOTE — Patient Instructions (Addendum)
EKG done today.  No Labs done today.  No medication changes were made. Please continue all current medications as prescribed.  Your physician recommends that you schedule a follow-up appointment in: 6 weeks with APP Clinic   If you have any questions or concerns before your next appointment please send [place] a message through [place] or call our office at [phone].    TO LEAVE A MESSAGE FOR THE NURSE SELECT OPTION 2, PLEASE LEAVE A MESSAGE INCLUDING: YOUR NAME DATE OF BIRTH CALL BACK NUMBER REASON FOR CALL**this is important as we prioritize the call backs  YOU WILL RECEIVE A CALL BACK THE SAME DAY AS LONG AS YOU CALL BEFORE 4:00 PM   Do the following things EVERYDAY: Weigh yourself in the morning before breakfast. Write it down and keep it in a log. Take your medicines as prescribed Eat low salt foods--Limit salt (sodium) to 2000 mg per day.  Stay as active as you can everyday Limit all fluids for the day to less than 2 liters   At the [facility], you and your health needs are our priority. As part of our continuing mission to provide you with exceptional heart care, we have created designated Provider Care Teams. These Care Teams include your primary Cardiologist (physician) and Advanced Practice Providers (APPs- Physician Assistants and Nurse Practitioners) who all work together to provide you with the care you need, when you need it.   You may see any of the following providers on your designated Care Team at your next follow up: Dr Glori Bickers Dr Haynes Kerns, NP Lyda Jester, [place] Audry Riles, PharmD   Please be sure to bring in all your medications bottles to every appointment.

## 2021-03-04 NOTE — Progress Notes (Signed)
Echocardiogram 2D Echocardiogram has been performed.  Oneal Deputy Latarra Eagleton 03/04/2021, 9:10 AM

## 2021-03-04 NOTE — Anesthesia Preprocedure Evaluation (Addendum)
Anesthesia Evaluation  Patient identified by MRN, date of birth, ID band Patient awake    Reviewed: Allergy & Precautions, NPO status , Patient's Chart, lab work & pertinent test results  Airway Mallampati: II  TM Distance: >3 FB Neck ROM: Limited    Dental  (+) Edentulous Upper, Missing, Dental Advisory Given, Poor Dentition, Partial Lower   Pulmonary neg pulmonary ROS,    Pulmonary exam normal breath sounds clear to auscultation       Cardiovascular hypertension, Pt. on medications + Peripheral Vascular Disease and +CHF  Normal cardiovascular exam+ Cardiac Defibrillator  Rhythm:Regular Rate:Normal  Echo  1. Left ventricular ejection fraction, by estimation, is 40 to 45%. The left ventricle has mildly decreased function. The left ventricle demonstrates global hypokinesis. Left ventricular diastolic parameters are indeterminate.  2. Right ventricular systolic function is moderately reduced. The right ventricular size is mildly enlarged. There is normal pulmonary artery systolic pressure. The estimated right ventricular systolic pressure is 88.2 mmHg.  3. Left atrial size was severely dilated.  4. Right atrial size was mildly dilated.  5. Bioprosthetic mitral valve. Mean gradient 6 mmHg, no significant regurgitation noted.  6. The aortic valve is tricuspid. Aortic valve regurgitation is not visualized. Mild aortic valve sclerosis is present, with no evidence of aortic valve stenosis.  7. Aortic dilatation noted. There is mild dilatation of the aortic root, measuring 42 mm.  8. The inferior vena cava is normal in size with greater than 50% respiratory variability, suggesting right atrial pressure of 3 mmHg.    AICD interrogation 11/2020 DDDR Total VP 1.0% (MVP On) AS-VS 31.0% AS-VP < 0.1% AP-VS 68.0% AP-VP 1.0%   Neuro/Psych CVA    GI/Hepatic negative GI ROS, Neg liver ROS,   Endo/Other  diabetes  Renal/GU Renal  disease     Musculoskeletal  (+) Arthritis ,   Abdominal   Peds  Hematology negative hematology ROS (+)   Anesthesia Other Findings   Reproductive/Obstetrics                                                          Anesthesia Evaluation  Patient identified by MRN, date of birth, ID band Patient awake    Reviewed: Allergy & Precautions, NPO status , Patient's Chart, lab work & pertinent test results  Airway Mallampati: I  TM Distance: >3 FB Neck ROM: Full    Dental  (+) Upper Dentures, Partial Lower, Dental Advisory Given   Pulmonary neg pulmonary ROS,    breath sounds clear to auscultation       Cardiovascular hypertension, Pt. on medications + Peripheral Vascular Disease and +CHF  + dysrhythmias Atrial Fibrillation and Supra Ventricular Tachycardia + Cardiac Defibrillator  Rhythm:Regular Rate:Normal     Neuro/Psych CVA negative psych ROS   GI/Hepatic Neg liver ROS, GERD  Medicated,  Endo/Other  diabetes  Renal/GU      Musculoskeletal negative musculoskeletal ROS (+)   Abdominal Normal abdominal exam  (+)   Peds  Hematology negative hematology ROS (+)   Anesthesia Other Findings   Reproductive/Obstetrics                             Anesthesia Physical Anesthesia Plan  ASA: IV  Anesthesia Plan: MAC   Post-op Pain Management:  Induction: Intravenous  PONV Risk Score and Plan: 2 and Ondansetron and Treatment may vary due to age or medical condition  Airway Management Planned: Simple Face Mask and Natural Airway  Additional Equipment: None  Intra-op Plan:   Post-operative Plan:   Informed Consent: I have reviewed the patients History and Physical, chart, labs and discussed the procedure including the risks, benefits and alternatives for the proposed anesthesia with the patient or authorized representative who has indicated his/her understanding and acceptance.     Dental  advisory given  Plan Discussed with: CRNA  Anesthesia Plan Comments: (Echo:  1. There is substantial LV setal-lateral dyssynchrony. Left ventricular  ejection fraction, by estimation, is 25 to 30%. The left ventricle has  severely decreased function. The left ventricle demonstrates global  hypokinesis. Left ventricular diastolic  function could not be evaluated.  2. Right ventricular systolic function is severely reduced. The right  ventricular size is severely enlarged. There is moderately elevated  pulmonary artery systolic pressure.  3. Left atrial size was moderately dilated.  4. Right atrial size was severely dilated.  5. The mitral valve has been repaired/replaced. No evidence of mitral  valve regurgitation. No evidence of mitral stenosis. The mean mitral valve  gradient is 6.1 mmHg with average heart rate of 121 bpm. There is a  bioprosthetic valve present in the  mitral position.  6. Tricuspid valve regurgitation is moderate.  7. The aortic valve is tricuspid. Aortic valve regurgitation is not  visualized. No aortic stenosis is present.  8. There is borderline dilatation of the ascending aorta, measuring 37  mm.  9. The inferior vena cava is dilated in size with <50% respiratory  variability, suggesting right atrial pressure of 15 mmHg. )      Anesthesia Quick Evaluation  Anesthesia Physical Anesthesia Plan  ASA: 4  Anesthesia Plan: General   Post-op Pain Management:    Induction: Intravenous  PONV Risk Score and Plan: 3 and Ondansetron, Dexamethasone and Treatment may vary due to age or medical condition  Airway Management Planned: Oral ETT  Additional Equipment: Arterial line  Intra-op Plan:   Post-operative Plan: Extubation in OR  Informed Consent: I have reviewed the patients History and Physical, chart, labs and discussed the procedure including the risks, benefits and alternatives for the proposed anesthesia with the patient or authorized  representative who has indicated his/her understanding and acceptance.     Dental advisory given  Plan Discussed with: CRNA  Anesthesia Plan Comments: (2 x PIV  PAT note by Karoline Caldwell, PA-C: Pertinent hx includes history of endocarditis s/p mitral valve replacement in 2014, atrial fibrillation/flutter, HTN, DM2,CKD 3,PAD (followed by vascular)chronic systolic heart failure, embolic CVA, anticoagulated with coumadin,significant PVC burden (20%) with an EF of 40-45% andSVT- status post Medtronic ICD.  Cardiac clearance per telephone encounter 02/19/21, "Chart reviewed as part of pre-operative protocol coverage. Patient was contacted6/2/2022in reference to pre-operative risk assessment for pending surgery as outlined below. Jabriel Vanduyne Timmonswas last seen on 5/13/22by Dr. Aundra Dubin for cardiac catheterization.Cardiac cath with low filling pressures, preserved CO, no significant CAD.Since that day, BENTZION DAURIA done well. He reports no new anginal symptoms nor new shortness of breath. Therefore, based on ACC/AHA guidelines, the patient would be at acceptable risk for the planned procedure without further cardiovascular testing.He verbalizes understanding to hold Warfarin 3 days prior to planned procedure. Has not yet been contacted by his PCP to discuss Lovenox bridge. Will route to our pharmacy team to request to coordinate Lovenox bridge  as I am not confident he will reach out to his PCP. He notes no trouble with previous Lovenox injections."  LD coumadin 03/01/21, pt is on Lovenox bridge.  EKG 03/04/21: Atrial-paced rhythm with prolonged AV conduction. Rate 62. Incomplete right bundle branch block  Perioperative device orders per note 03/03/21: Device Information:  Clinic EP Physician:Will [name], MD Device Type:Defibrillator Manufacturer and Phone #:Medtronic: [id number] Pacemaker Dependent?:No Date of Last Device Check:6/13/2022Normal Device  Function?:Yes  Electrophysiologist's Recommendations:  . Have magnet available. . Provide continuous ECG monitoring when magnet is used or reprogramming is to be performed. . Procedure may interfere with device function. Magnet should be placed over device during procedure.  TTE 03/04/21: 1. Left ventricular ejection fraction, by estimation, is 40 to 45%. The  left ventricle has mildly decreased function. The left ventricle  demonstrates global hypokinesis. Left ventricular diastolic parameters are  indeterminate.  2. Right ventricular systolic function is moderately reduced. The right  ventricular size is mildly enlarged. There is normal pulmonary artery  systolic pressure. The estimated right ventricular systolic pressure is  98.3 mmHg.  3. Left atrial size was severely dilated.  4. Right atrial size was mildly dilated.  5. Bioprosthetic mitral valve. Mean gradient 6 mmHg, no significant  regurgitation noted.  6. The aortic valve is tricuspid. Aortic valve regurgitation is not  visualized. Mild aortic valve sclerosis is present, with no evidence of  aortic valve stenosis.  7. Aortic dilatation noted. There is mild dilatation of the aortic root,  measuring 42 mm.  8. The inferior vena cava is normal in size with greater than 50%  respiratory variability, suggesting right atrial pressure of 3 mmHg.   Cath 01/30/21: 1. Low filling pressures. 2. Preserved cardiac output. 3. No significant CAD.   Nonischemic cardiomyopathy (surprising given significant peripheral vascular disease).   Encourage increased po hydration at home (he is not on a loop diuretic).   Resume Lovenox/warfarin bridge per coumadin clinic. He has instructions for this.  )     Anesthesia Quick Evaluation

## 2021-03-04 NOTE — Progress Notes (Signed)
Anesthesia Chart Review:  Pertinent hx includes history of endocarditis s/p mitral valve replacement in 2014, atrial fibrillation/flutter, HTN, DM2, CKD 3,PAD (followed by vascular) chronic systolic heart failure, embolic CVA, anticoagulated with coumadin, significant PVC burden (20%) with an EF of 40-45% and SVT - status post Medtronic ICD.  Cardiac clearance per telephone encounter 02/19/21, "Chart reviewed as part of pre-operative protocol coverage. Patient was contacted 02/19/2021 in reference to pre-operative risk assessment for pending surgery as outlined below.  Ryan [name] was last seen on 01/30/21 by Dr. Aundra Dubin for cardiac catheterization. Cardiac cath with low filling pressures, preserved CO, no significant CAD. Since that day, Ryan [name] has done well. He reports no new anginal symptoms nor new shortness of breath. Therefore, based on ACC/AHA guidelines, the patient would be at acceptable risk for the planned procedure without further cardiovascular testing. He verbalizes understanding to hold Warfarin 3 days prior to planned procedure. Has not yet been contacted by his PCP to discuss Lovenox bridge. Will route to our pharmacy team to request to coordinate Lovenox bridge as I am not confident he will reach out to his PCP. He notes no trouble with previous Lovenox injections."  LD coumadin 03/01/21, pt is on Lovenox bridge.  EKG 03/04/21: Atrial-paced rhythm with prolonged AV conduction. Rate 62. Incomplete right bundle branch block  Perioperative device orders per note 03/03/21: Device Information:   Clinic EP Physician:   Allegra Lai, MD Device Type:  Defibrillator Manufacturer and Phone #:  Medtronic: [phone] Pacemaker Dependent?:  No Date of Last Device Check:  03/02/2021        Normal Device Function?:  Yes     Electrophysiologist's Recommendations:   Have magnet available. Provide continuous ECG monitoring when magnet is used or reprogramming is to be performed.   Procedure may interfere with device function.  Magnet should be placed over device during procedure.  TTE 03/04/21:  1. Left ventricular ejection fraction, by estimation, is 40 to 45%. The  left ventricle has mildly decreased function. The left ventricle  demonstrates global hypokinesis. Left ventricular diastolic parameters are  indeterminate.   2. Right ventricular systolic function is moderately reduced. The right  ventricular size is mildly enlarged. There is normal pulmonary artery  systolic pressure. The estimated right ventricular systolic pressure is  [id number] mmHg.   3. Left atrial size was severely dilated.   4. Right atrial size was mildly dilated.   5. Bioprosthetic mitral valve. Mean gradient 6 mmHg, no significant  regurgitation noted.   6. The aortic valve is tricuspid. Aortic valve regurgitation is not  visualized. Mild aortic valve sclerosis is present, with no evidence of  aortic valve stenosis.   7. Aortic dilatation noted. There is mild dilatation of the aortic root,  measuring 42 mm.   8. The inferior vena cava is normal in size with greater than 50%  respiratory variability, suggesting right atrial pressure of 3 mmHg.   Cath 01/30/21: 1. Low filling pressures. 2. Preserved cardiac output. 3. No significant CAD.   Nonischemic cardiomyopathy (surprising given significant peripheral vascular disease).     Encourage increased po hydration at home (he is not on a loop diuretic).     Resume Lovenox/warfarin bridge per coumadin clinic.  He has instructions for this.   Wynonia Musty [facility] Short Stay Center/Anesthesiology Phone [phone] 03/04/2021 1:57 PM

## 2021-03-04 NOTE — Progress Notes (Signed)
Advanced Heart Failure Clinic Note   PCP: [name], MD PCP-Cardiologist: [name], MD  EP: Dr. [name] HF Cardiologist: Dr. Aundra Dubin  Reason for Visit: F/u for Systolic Heart Failure  HPI: Ryan [name] is a 75 y.o. 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   He has a history of endocarditis that resulted in mitral valve replacement in 2014. Heart cath prior to that surgery showed widely patent coronaries and moderate to severe pulmonary hypertension.   He was admitted in 12/21 with incessant SVT and CHF.  Echo showed EF 25-30% with severe RV dysfunction.  He initially had poor response to IV diuretics w/ rising SCr. RHC showed evidence for severe biventricular failure with PCWP and RA pressure > 30 and PAPi 0.8. Equalization of diastolic pressures concerning for restrictive physiology.  Cardiac output preserved (CI 2.28 Fick, 2.33 thermo). Was transferred to CCU for lasix gtt and later required addition of milrinone to aid in diuresis. 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  Also had frequent PVCs treated w/ amiodarone. Of note, LHC was not pursued given abnormal renal function. He was diuresed and transitioned to po diuretics. SCr down to 1.74 day of d/c (down from peak of 2.4).   Patient eventually had RHC/LHC in 5/22.  This showed no significant CAD, normal filling pressures, and preserved cardiac output.  He had peripheral angiography and is planned for right SFA => peroneal bypass on 03/06/21 by VVS.   Echo was done today and reviewed, EF 40-45%, diffuse hypokinesis, moderately decreased RV systolic function, IVC normal, bioprosthetic mitral valve normal.   He returns for followup of CHF.  He has a right foot  ulcer followed by podiatry.  He is walking with a cane though not very active due to foot ulcer.  He has occasional right calf claudication.  No chest pain.  He uses Lasix rarely.  No dyspnea walking on flat ground at a slow/steady rate but gets tired/short of breath if he "rushes."    No lightheadedness.  No orthopnea/PND.   REDS clip 30%    Labs (3/22): K 4.1, creatinine 1.7 Labs (6/22): K 4.7, creatinine 1.91, AST 55, ALT 46, hgb 11.8  Medtronic device interrogation: Stable thoracic impedance, no AF/VT.   ECG (personally reviewed): a-paced, long PR interval   Review of systems complete and found to be negative unless listed in HPI.    PMH: 1. Type 2 diabetes. 2. HTN 3. PAD: H/o right popliteal occlusion.  4. SVT: Incessant, slow AVNRT.  Ablation in 12/21.  5. PVCs: Frequent, up to 20% of beats by prior monitoring.  6. Atrial fibrillation: Paroxysmal. Has h/o CVA.  7. CVA: Related to atrial fibrillation.   8. 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  9. CKD stage 3 10. H/o junctional rhythm 11. Chronic systolic CHF: Nonischemic cardiomyopathy.  - LHC (1/14): no significant CAD. - Echo (12/21): EF 25-30% with dyssynchrony, severely decreased RV systolic function, biatrial enlargement, s/p bioprosthetic mitral valve replacement with mean gradient 6 mmHg and no MR, moderate TR.  - RHC (12/21): mean RA 31, PA 57/33 mean 45, mean PCWP 36, PAPi 0.8, CI 2.28, PVR 1.9 WU.  - CPX (4/22): peak VO2 15.9, VE/VCO2 slope 59, RER 0.91 => submaximal,  moderate HF limitation.  - LHC/RHC (5/22): no significant CAD; mean RA 2, PA 27/7, mean PCWP 4, CI 2.6 Fick, CI 3.1 thermo - Echo (6/22): EF 40-45%, diffuse hypokinesis, moderately decreased RV systolic function, IVC normal, bioprosthetic mitral valve normal.    Current Outpatient Medications  Medication Sig Dispense Refill   acetaminophen (TYLENOL) 325 MG tablet Take 650 mg by mouth every 6 (six) hours as  needed for mild pain or headache.     amiodarone (PACERONE) 200 MG tablet TAKE 1 TABLET BY MOUTH EVERY DAY 90 tablet 3   atorvastatin (LIPITOR) 40 MG tablet Take 1 tablet (40 mg total) by mouth daily at 6 PM. 30 tablet 5   empagliflozin (JARDIANCE) 10 MG TABS tablet Take 1 tablet (10 mg total) by mouth daily. 90 tablet 3   enoxaparin (LOVENOX) 80 MG/0.8ML injection Inject '80mg'$  into the belly every 12 hours. Last dose prior to procedure is 6/16 @ 9AM. 4 mL 0   furosemide (LASIX) 40 MG tablet Take 40 mg by mouth daily as needed for edema.     gabapentin (NEURONTIN) 300 MG capsule Take 300 mg by mouth at bedtime.     HYDROcodone-acetaminophen (NORCO/VICODIN) 5-325 MG per tablet Take 1 tablet by mouth 2 (two) times daily as needed for moderate pain.      Multiple Vitamins-Minerals (CENTRUM SILVER PO) Take 1 tablet by mouth 3 (three) times a week.     mupirocin ointment (BACTROBAN) 2 % Apply 1 application topically 2 (two) times daily.     pantoprazole (PROTONIX) 40 MG tablet Take 40 mg by mouth daily.      sacubitril-valsartan (ENTRESTO) 24-26 MG Take 1 tablet by mouth 2 (two) times daily. 60 tablet 11   spironolactone (ALDACTONE) 25 MG tablet Take 0.5 tablets (12.5 mg total) by mouth daily. 30 tablet 5   tamsulosin (FLOMAX) 0.4 MG CAPS capsule Take 0.4 mg by mouth daily.      triamcinolone cream (KENALOG) 0.5 % Apply 1 application topically daily as needed (skin rash).      metoprolol succinate (TOPROL-XL) 25 MG 24 hr tablet Take 1 tablet (25 mg total) by mouth daily. 90 tablet 3   warfarin (COUMADIN) 2.5 MG tablet Take 2.'5mg'$  ( 1 tablet ) Monday, wednesday and Friday  Other days take '5mg'$  (2 tablets) (Patient not taking: No sig reported) 44 tablet 1   No current facility-administered medications for this encounter.    Allergies  Allergen Reactions   Geralyn Flash [Fish Allergy] Nausea And Vomiting      Social History   Socioeconomic History   Marital status: Divorced    Spouse name: Not on file    Number of children: 3   Years of education: Not on file   Highest education level: Not on file  Occupational History   Not on file  Tobacco Use   Smoking status: Never   Smokeless tobacco: Never  Vaping Use   Vaping Use: Never used  Substance and Sexual Activity   Alcohol use: No    Alcohol/week: 0.0 standard drinks   Drug use: No   Sexual activity: Not on file  Other Topics Concern   Not on file  Social History Narrative   Daughter lives with him.  Retired Advertising account planner   Social Determinants of Radio broadcast assistant Strain: Not on [employer] Insecurity: No Food Insecurity   Worried About Charity fundraiser in the Last Year: Never true   [employer] in the  Last Year: Never true  Transportation Needs: No Transportation Needs   Lack of Transportation (Medical): No   Lack of Transportation (Non-Medical): No  Physical Activity: Not on file  Stress: Not on file  Social Connections: Not on file  Intimate Partner Violence: Not on file     Family History  Problem Relation Age of Onset   Hypertension Mother    Hypertension Father     Vitals:   03/04/21 0941  BP: 110/60  Pulse: 63  SpO2: 100%  Weight: 73 kg (161 lb)   Wt Readings from Last 3 Encounters:  03/04/21 73 kg (161 lb)  03/03/21 73.4 kg (161 lb 12.8 oz)  02/25/21 70.2 kg (154 lb 12.8 oz)   PHYSICAL EXAM: General: NAD Neck: No JVD, no thyromegaly or thyroid nodule.  Lungs: Clear to auscultation bilaterally with normal respiratory effort. CV: Nondisplaced PMI.  Heart regular S1/S2, no S3/S4, no murmur.  No peripheral edema.  No carotid bruit.  Unable to palpate pedal pulses.  Abdomen: Soft, nontender, no hepatosplenomegaly, no distention.  Skin: Intact without lesions or rashes.  Neurologic: Alert and oriented x 3.  Psych: Normal affect. Extremities: No clubbing or cyanosis.  HEENT: Normal.   ASSESSMENT & PLAN: 1. Chronic Systolic CHF: Biventricular failure, nonischemic CMP. Has MDT ICD.   Echo 12/21 showed EF 20-25%, moderate RV enlargement with severely decreased RV systolic function, normally functioning bioprosthetic mitral valve with no significant MR and mean gradient 6, moderate TR, dilated IVC.  Patient had normal EF in 12/13 pre-mitral valve replacement.  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  His last cath was in 1/14 pre-surgery, no significant CAD. On [place] in 12/21, evidence for severe biventricular failure with PCWP and RA pressure > 30 and PAPi 0.8.  Equalization of diastolic pressures concerning for restrictive physiology.  Cardiac output preserved (CI 2.28 Fick, 2.33 thermo).  Moderate HF limitation on 4/22 CPX.  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  Also has history of frequent PVCs.  LHC/RHC in 5/22 showed no significant coronary disease, preserved cardiac output, and low filling pressure.  Echo ion 6/22 with EF 40-45%, moderate RV dysfunction.  Symptoms are NYHA class II-III, he is not volume overloaded on exam or by REDS clip.  Fluid index on Medtronic device check is > threshold, but think this is inaccurate.  - He does not need scheduled diuretics, lasix prn - Narrow QRS, not candidate for CRT.  - Continue Entresto 24/26 bid.  - Continue Toprol XL 25 mg daily, will not increase with long PR interval. - Continue Jardiance 10 mg daily. - Continue spironolactone 12.5 daily.  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  S/P ablation 09/03/20. It is possible that incessant relatively slow AVNRT contributed to cardiomyopathy. He remains in NSR today.  - We need to keep in NSR if at all possible given concern for tachy-mediated CMP.  3. PVCs: History of very frequent PVCs. This may play a role in biventricular failure.  - Continue amiodarone 200 mg daily.  LFTs recently elevated, will need to be rechecked in  future.  4. PAD: h/o right popliteal occlusion. Seen by VVS, plan for R SFA => peroneal bypass on 03/06/21.  - Continue atorvastatin.  5. Atrial fibrillation: Paroxysmal.  Has history of CVA.  NSR today.  - On warfarin. INRs followed by  PCP in [place]. 6. Bioprosthetic mitral valve: Stable function on echo in 6/22.  7. CKD stage 3: Recent BMET with stable creatinine 1.9.    Followup 6 wks with APP, repeat LFTs at that time.   Loralie Champagne, MD 03/04/21

## 2021-03-05 ENCOUNTER — Telehealth (HOSPITAL_COMMUNITY): Payer: Self-pay | Admitting: *Deleted

## 2021-03-05 NOTE — Telephone Encounter (Signed)
Patient called with questions about lovenox injections. I transferred pt to the coumadin clinic.

## 2021-03-06 ENCOUNTER — Inpatient Hospital Stay (HOSPITAL_COMMUNITY): Payer: Medicare HMO | Admitting: Anesthesiology

## 2021-03-06 ENCOUNTER — Inpatient Hospital Stay (HOSPITAL_COMMUNITY): Payer: Medicare HMO | Admitting: Physician Assistant

## 2021-03-06 ENCOUNTER — Encounter (HOSPITAL_COMMUNITY): Payer: Self-pay | Admitting: Vascular Surgery

## 2021-03-06 ENCOUNTER — Other Ambulatory Visit: Payer: Self-pay

## 2021-03-06 ENCOUNTER — Inpatient Hospital Stay (HOSPITAL_COMMUNITY): Payer: Medicare HMO

## 2021-03-06 ENCOUNTER — Encounter (HOSPITAL_COMMUNITY)
Admission: RE | Disposition: A | Discharge status: Skilled Nursing Facility | Payer: Self-pay | Source: Home / Self Care | Attending: Vascular Surgery

## 2021-03-06 ENCOUNTER — Inpatient Hospital Stay (HOSPITAL_COMMUNITY)
Admission: RE | Admit: 2021-03-06 | Discharge: 2021-03-12 | DRG: 253 | Disposition: A | Discharge status: Skilled Nursing Facility | Payer: Medicare HMO | Attending: Vascular Surgery | Admitting: Vascular Surgery

## 2021-03-06 DIAGNOSIS — Z7901 Long term (current) use of anticoagulants: Secondary | ICD-10-CM

## 2021-03-06 DIAGNOSIS — I493 Ventricular premature depolarization: Secondary | ICD-10-CM | POA: Diagnosis present

## 2021-03-06 DIAGNOSIS — E1122 Type 2 diabetes mellitus with diabetic chronic kidney disease: Secondary | ICD-10-CM | POA: Diagnosis present

## 2021-03-06 DIAGNOSIS — I9589 Other hypotension: Secondary | ICD-10-CM | POA: Diagnosis not present

## 2021-03-06 DIAGNOSIS — E1169 Type 2 diabetes mellitus with other specified complication: Secondary | ICD-10-CM | POA: Diagnosis present

## 2021-03-06 DIAGNOSIS — R34 Anuria and oliguria: Secondary | ICD-10-CM | POA: Diagnosis not present

## 2021-03-06 DIAGNOSIS — D62 Acute posthemorrhagic anemia: Secondary | ICD-10-CM | POA: Diagnosis not present

## 2021-03-06 DIAGNOSIS — M869 Osteomyelitis, unspecified: Secondary | ICD-10-CM | POA: Diagnosis present

## 2021-03-06 DIAGNOSIS — N400 Enlarged prostate without lower urinary tract symptoms: Secondary | ICD-10-CM | POA: Diagnosis present

## 2021-03-06 DIAGNOSIS — I272 Pulmonary hypertension, unspecified: Secondary | ICD-10-CM | POA: Diagnosis not present

## 2021-03-06 DIAGNOSIS — I5022 Chronic systolic (congestive) heart failure: Secondary | ICD-10-CM | POA: Diagnosis present

## 2021-03-06 DIAGNOSIS — I130 Hypertensive heart and chronic kidney disease with heart failure and stage 1 through stage 4 chronic kidney disease, or unspecified chronic kidney disease: Secondary | ICD-10-CM | POA: Diagnosis not present

## 2021-03-06 DIAGNOSIS — Z95810 Presence of automatic (implantable) cardiac defibrillator: Secondary | ICD-10-CM

## 2021-03-06 DIAGNOSIS — Z91013 Allergy to seafood: Secondary | ICD-10-CM

## 2021-03-06 DIAGNOSIS — Z953 Presence of xenogenic heart valve: Secondary | ICD-10-CM | POA: Diagnosis not present

## 2021-03-06 DIAGNOSIS — G8929 Other chronic pain: Secondary | ICD-10-CM | POA: Diagnosis present

## 2021-03-06 DIAGNOSIS — Z8673 Personal history of transient ischemic attack (TIA), and cerebral infarction without residual deficits: Secondary | ICD-10-CM

## 2021-03-06 DIAGNOSIS — E11621 Type 2 diabetes mellitus with foot ulcer: Secondary | ICD-10-CM | POA: Diagnosis not present

## 2021-03-06 DIAGNOSIS — I358 Other nonrheumatic aortic valve disorders: Secondary | ICD-10-CM | POA: Diagnosis present

## 2021-03-06 DIAGNOSIS — I471 Supraventricular tachycardia: Secondary | ICD-10-CM | POA: Diagnosis present

## 2021-03-06 DIAGNOSIS — L97519 Non-pressure chronic ulcer of other part of right foot with unspecified severity: Secondary | ICD-10-CM | POA: Diagnosis present

## 2021-03-06 DIAGNOSIS — Z20822 Contact with and (suspected) exposure to covid-19: Secondary | ICD-10-CM | POA: Diagnosis present

## 2021-03-06 DIAGNOSIS — N179 Acute kidney failure, unspecified: Secondary | ICD-10-CM | POA: Diagnosis not present

## 2021-03-06 DIAGNOSIS — I70201 Unspecified atherosclerosis of native arteries of extremities, right leg: Secondary | ICD-10-CM | POA: Diagnosis not present

## 2021-03-06 DIAGNOSIS — Z8679 Personal history of other diseases of the circulatory system: Secondary | ICD-10-CM

## 2021-03-06 DIAGNOSIS — I70235 Atherosclerosis of native arteries of right leg with ulceration of other part of foot: Secondary | ICD-10-CM | POA: Diagnosis not present

## 2021-03-06 DIAGNOSIS — I480 Paroxysmal atrial fibrillation: Secondary | ICD-10-CM | POA: Diagnosis present

## 2021-03-06 DIAGNOSIS — N183 Chronic kidney disease, stage 3 unspecified: Secondary | ICD-10-CM | POA: Diagnosis present

## 2021-03-06 DIAGNOSIS — Z79899 Other long term (current) drug therapy: Secondary | ICD-10-CM

## 2021-03-06 DIAGNOSIS — M545 Low back pain, unspecified: Secondary | ICD-10-CM | POA: Diagnosis present

## 2021-03-06 DIAGNOSIS — I5082 Biventricular heart failure: Secondary | ICD-10-CM | POA: Diagnosis present

## 2021-03-06 DIAGNOSIS — I4892 Unspecified atrial flutter: Secondary | ICD-10-CM | POA: Diagnosis present

## 2021-03-06 DIAGNOSIS — Z7984 Long term (current) use of oral hypoglycemic drugs: Secondary | ICD-10-CM

## 2021-03-06 DIAGNOSIS — Z419 Encounter for procedure for purposes other than remedying health state, unspecified: Secondary | ICD-10-CM

## 2021-03-06 DIAGNOSIS — I77810 Thoracic aortic ectasia: Secondary | ICD-10-CM | POA: Diagnosis present

## 2021-03-06 DIAGNOSIS — I420 Dilated cardiomyopathy: Secondary | ICD-10-CM | POA: Diagnosis present

## 2021-03-06 DIAGNOSIS — I739 Peripheral vascular disease, unspecified: Secondary | ICD-10-CM | POA: Diagnosis present

## 2021-03-06 DIAGNOSIS — Z8249 Family history of ischemic heart disease and other diseases of the circulatory system: Secondary | ICD-10-CM

## 2021-03-06 HISTORY — PX: BYPASS GRAFT FEMORAL-PERONEAL: SHX5762

## 2021-03-06 LAB — PROTIME-INR
INR: 1.2 (ref 0.8–1.2)
Prothrombin Time: 14.9 seconds (ref 11.4–15.2)

## 2021-03-06 LAB — CBC
HCT: 26.8 % — ABNORMAL LOW (ref 39.0–52.0)
Hemoglobin: 8.3 g/dL — ABNORMAL LOW (ref 13.0–17.0)
MCH: 24.6 pg — ABNORMAL LOW (ref 26.0–34.0)
MCHC: 31 g/dL (ref 30.0–36.0)
MCV: 79.5 fL — ABNORMAL LOW (ref 80.0–100.0)
Platelets: 105 10*3/uL — ABNORMAL LOW (ref 150–400)
RBC: 3.37 MIL/uL — ABNORMAL LOW (ref 4.22–5.81)
RDW: 17.4 % — ABNORMAL HIGH (ref 11.5–15.5)
WBC: 11.3 10*3/uL — ABNORMAL HIGH (ref 4.0–10.5)
nRBC: 0 % (ref 0.0–0.2)

## 2021-03-06 LAB — APTT: aPTT: 30 seconds (ref 24–36)

## 2021-03-06 LAB — GLUCOSE, CAPILLARY
Glucose-Capillary: 88 mg/dL (ref 70–99)
Glucose-Capillary: 111 mg/dL — ABNORMAL HIGH (ref 70–99)
Glucose-Capillary: 109 mg/dL — ABNORMAL HIGH (ref 70–99)

## 2021-03-06 LAB — PREPARE RBC (CROSSMATCH)

## 2021-03-06 SURGERY — CREATION, BYPASS, ARTERIAL, FEMORAL TO PERONEAL, USING GRAFT
Anesthesia: General | Site: Leg Upper | Laterality: Right

## 2021-03-06 MED ORDER — LIDOCAINE 2% (20 MG/ML) 5 ML SYRINGE
INTRAMUSCULAR | Status: DC | PRN
  Administered 2021-03-06: 60 mg via INTRAVENOUS

## 2021-03-06 MED ORDER — LACTATED RINGERS IV SOLN: INTRAVENOUS | Status: DC | PRN

## 2021-03-06 MED ORDER — PHENYLEPHRINE 40 MCG/ML (10ML) SYRINGE FOR IV PUSH (FOR BLOOD PRESSURE SUPPORT)
PREFILLED_SYRINGE | INTRAVENOUS | Status: AC
  Filled 2021-03-06: qty 10

## 2021-03-06 MED ORDER — SODIUM CHLORIDE 0.9 % IV SOLN
500.0000 mL | Freq: Once | INTRAVENOUS | Status: AC | PRN
  Administered 2021-03-06: 500 mL via INTRAVENOUS

## 2021-03-06 MED ORDER — DOCUSATE SODIUM 100 MG PO CAPS
100.0000 mg | ORAL_CAPSULE | Freq: Every day | ORAL | Status: DC
  Administered 2021-03-07 – 2021-03-12 (×5): 100 mg via ORAL
  Filled 2021-03-06 (×5): qty 1

## 2021-03-06 MED ORDER — CEFAZOLIN SODIUM-DEXTROSE 2-4 GM/100ML-% IV SOLN
INTRAVENOUS | Status: AC
  Filled 2021-03-06: qty 100

## 2021-03-06 MED ORDER — IODIXANOL 320 MG/ML IV SOLN
INTRAVENOUS | Status: DC | PRN
  Administered 2021-03-06: 40 mL

## 2021-03-06 MED ORDER — LIDOCAINE HCL (PF) 2 % IJ SOLN
INTRAMUSCULAR | Status: AC
  Filled 2021-03-06: qty 5

## 2021-03-06 MED ORDER — PROPOFOL 10 MG/ML IV BOLUS
INTRAVENOUS | Status: DC | PRN
  Administered 2021-03-06: 100 mg via INTRAVENOUS

## 2021-03-06 MED ORDER — OXYCODONE HCL 5 MG PO TABS: 5.0000 mg | ORAL_TABLET | Freq: Once | ORAL | Status: DC | PRN

## 2021-03-06 MED ORDER — LABETALOL HCL 5 MG/ML IV SOLN: 10.0000 mg | INTRAVENOUS | Status: DC | PRN

## 2021-03-06 MED ORDER — ONDANSETRON HCL 4 MG/2ML IJ SOLN
INTRAMUSCULAR | Status: AC
  Filled 2021-03-06: qty 2

## 2021-03-06 MED ORDER — HEPARIN SODIUM (PORCINE) 1000 UNIT/ML IJ SOLN
INTRAMUSCULAR | Status: AC
  Filled 2021-03-06: qty 1

## 2021-03-06 MED ORDER — CHLORHEXIDINE GLUCONATE CLOTH 2 % EX PADS: 6.0000 | MEDICATED_PAD | Freq: Once | CUTANEOUS | Status: DC

## 2021-03-06 MED ORDER — ORAL CARE MOUTH RINSE
15.0000 mL | Freq: Once | OROMUCOSAL | Status: AC
Start: 2021-03-06 — End: 2021-03-06

## 2021-03-06 MED ORDER — HYDROMORPHONE HCL 1 MG/ML IJ SOLN
INTRAMUSCULAR | Status: AC
  Filled 2021-03-06: qty 1

## 2021-03-06 MED ORDER — ROCURONIUM BROMIDE 10 MG/ML (PF) SYRINGE
PREFILLED_SYRINGE | INTRAVENOUS | Status: AC
  Filled 2021-03-06: qty 10

## 2021-03-06 MED ORDER — ACETAMINOPHEN 325 MG PO TABS
325.0000 mg | ORAL_TABLET | ORAL | Status: DC | PRN
  Administered 2021-03-12 (×2): 650 mg via ORAL
  Filled 2021-03-06 (×2): qty 2

## 2021-03-06 MED ORDER — ALBUMIN HUMAN 5 % IV SOLN: INTRAVENOUS | Status: DC | PRN

## 2021-03-06 MED ORDER — ALBUMIN HUMAN 5 % IV SOLN
12.5000 g | Freq: Once | INTRAVENOUS | Status: AC
  Administered 2021-03-06: 12.5 g via INTRAVENOUS

## 2021-03-06 MED ORDER — DEXAMETHASONE SODIUM PHOSPHATE 10 MG/ML IJ SOLN
INTRAMUSCULAR | Status: AC
  Filled 2021-03-06: qty 1

## 2021-03-06 MED ORDER — SUGAMMADEX SODIUM 200 MG/2ML IV SOLN
INTRAVENOUS | Status: DC | PRN
  Administered 2021-03-06: 200 mg via INTRAVENOUS

## 2021-03-06 MED ORDER — ARTIFICIAL TEARS OPHTHALMIC OINT
TOPICAL_OINTMENT | OPHTHALMIC | Status: AC
  Filled 2021-03-06: qty 3.5

## 2021-03-06 MED ORDER — CEFAZOLIN SODIUM-DEXTROSE 2-4 GM/100ML-% IV SOLN
2.0000 g | Freq: Three times a day (TID) | INTRAVENOUS | Status: AC
Start: 2021-03-06 — End: 2021-03-07
  Administered 2021-03-06 – 2021-03-07 (×2): 2 g via INTRAVENOUS
  Filled 2021-03-06 (×2): qty 100

## 2021-03-06 MED ORDER — MIDAZOLAM HCL 2 MG/2ML IJ SOLN
INTRAMUSCULAR | Status: AC
  Filled 2021-03-06: qty 2

## 2021-03-06 MED ORDER — SODIUM CHLORIDE 0.9 % IV SOLN
INTRAVENOUS | Status: AC
  Filled 2021-03-06: qty 1.2

## 2021-03-06 MED ORDER — HYDROMORPHONE HCL 1 MG/ML IJ SOLN
0.2500 mg | INTRAMUSCULAR | Status: DC | PRN
  Administered 2021-03-06: 0.25 mg via INTRAVENOUS

## 2021-03-06 MED ORDER — CEFAZOLIN SODIUM-DEXTROSE 2-4 GM/100ML-% IV SOLN
2.0000 g | INTRAVENOUS | Status: AC
  Administered 2021-03-06: 2 g via INTRAVENOUS

## 2021-03-06 MED ORDER — MIDAZOLAM HCL 5 MG/5ML IJ SOLN
INTRAMUSCULAR | Status: DC | PRN
  Administered 2021-03-06 (×2): 1 mg via INTRAVENOUS

## 2021-03-06 MED ORDER — EMPAGLIFLOZIN 10 MG PO TABS
10.0000 mg | ORAL_TABLET | Freq: Every day | ORAL | Status: DC
  Administered 2021-03-07 – 2021-03-08 (×2): 10 mg via ORAL
  Filled 2021-03-06 (×3): qty 1

## 2021-03-06 MED ORDER — HEPARIN (PORCINE) 25000 UT/250ML-% IV SOLN
1200.0000 [IU]/h | INTRAVENOUS | Status: DC
  Administered 2021-03-06: 500 [IU]/h via INTRAVENOUS
  Administered 2021-03-09: 1000 [IU]/h via INTRAVENOUS
  Administered 2021-03-11: 1200 [IU]/h via INTRAVENOUS
  Filled 2021-03-06 (×5): qty 250

## 2021-03-06 MED ORDER — TAMSULOSIN HCL 0.4 MG PO CAPS
0.4000 mg | ORAL_CAPSULE | Freq: Every day | ORAL | Status: DC
  Administered 2021-03-07 – 2021-03-12 (×6): 0.4 mg via ORAL
  Filled 2021-03-06 (×6): qty 1

## 2021-03-06 MED ORDER — MIDAZOLAM HCL 2 MG/2ML IJ SOLN: 0.5000 mg | Freq: Once | INTRAMUSCULAR | Status: DC | PRN

## 2021-03-06 MED ORDER — ONDANSETRON HCL 4 MG/2ML IJ SOLN
INTRAMUSCULAR | Status: DC | PRN
  Administered 2021-03-06: 4 mg via INTRAVENOUS

## 2021-03-06 MED ORDER — SODIUM CHLORIDE 0.9 % IV SOLN: INTRAVENOUS | Status: DC

## 2021-03-06 MED ORDER — OXYCODONE-ACETAMINOPHEN 5-325 MG PO TABS
1.0000 | ORAL_TABLET | ORAL | Status: DC | PRN
  Administered 2021-03-07: 2 via ORAL
  Administered 2021-03-07: 1 via ORAL
  Administered 2021-03-07: 2 via ORAL
  Administered 2021-03-07: 1 via ORAL
  Administered 2021-03-08 – 2021-03-11 (×8): 2 via ORAL
  Administered 2021-03-11: 1 via ORAL
  Filled 2021-03-06 (×12): qty 2
  Filled 2021-03-06: qty 1

## 2021-03-06 MED ORDER — HEMOSTATIC AGENTS (NO CHARGE) OPTIME
TOPICAL | Status: DC | PRN
  Administered 2021-03-06: 1 via TOPICAL

## 2021-03-06 MED ORDER — GUAIFENESIN-DM 100-10 MG/5ML PO SYRP: 15.0000 mL | ORAL_SOLUTION | ORAL | Status: DC | PRN

## 2021-03-06 MED ORDER — SACUBITRIL-VALSARTAN 24-26 MG PO TABS
1.0000 | ORAL_TABLET | Freq: Two times a day (BID) | ORAL | Status: DC
  Administered 2021-03-06 – 2021-03-07 (×2): 1 via ORAL
  Filled 2021-03-06 (×3): qty 1

## 2021-03-06 MED ORDER — HEPARIN SODIUM (PORCINE) 1000 UNIT/ML IJ SOLN
INTRAMUSCULAR | Status: DC | PRN
  Administered 2021-03-06 (×3): 3000 [IU] via INTRAVENOUS
  Administered 2021-03-06: 8000 [IU] via INTRAVENOUS

## 2021-03-06 MED ORDER — SODIUM CHLORIDE 0.9% IV SOLUTION: Freq: Once | INTRAVENOUS | Status: DC

## 2021-03-06 MED ORDER — CHLORHEXIDINE GLUCONATE 0.12 % MT SOLN
15.0000 mL | Freq: Once | OROMUCOSAL | Status: AC
  Administered 2021-03-06: 15 mL via OROMUCOSAL

## 2021-03-06 MED ORDER — METOPROLOL SUCCINATE ER 25 MG PO TB24
ORAL_TABLET | ORAL | Status: AC
  Filled 2021-03-06: qty 1

## 2021-03-06 MED ORDER — PHENYLEPHRINE HCL-NACL 10-0.9 MG/250ML-% IV SOLN
INTRAVENOUS | Status: DC | PRN
  Administered 2021-03-06: 20 ug/min via INTRAVENOUS

## 2021-03-06 MED ORDER — SODIUM CHLORIDE 0.9 % IV SOLN
INTRAVENOUS | Status: DC | PRN
  Administered 2021-03-06: 500 mL

## 2021-03-06 MED ORDER — FENTANYL CITRATE (PF) 250 MCG/5ML IJ SOLN
INTRAMUSCULAR | Status: DC | PRN
  Administered 2021-03-06: 100 ug via INTRAVENOUS
  Administered 2021-03-06 (×3): 50 ug via INTRAVENOUS

## 2021-03-06 MED ORDER — OXYCODONE HCL 5 MG/5ML PO SOLN
5.0000 mg | Freq: Once | ORAL | Status: DC | PRN
Start: 2021-03-06 — End: 2021-03-06

## 2021-03-06 MED ORDER — LACTATED RINGERS IV SOLN: INTRAVENOUS | Status: DC

## 2021-03-06 MED ORDER — BISACODYL 5 MG PO TBEC: 5.0000 mg | DELAYED_RELEASE_TABLET | Freq: Every day | ORAL | Status: DC | PRN

## 2021-03-06 MED ORDER — POLYETHYLENE GLYCOL 3350 17 G PO PACK: 17.0000 g | PACK | Freq: Every day | ORAL | Status: DC | PRN

## 2021-03-06 MED ORDER — METOPROLOL TARTRATE 5 MG/5ML IV SOLN: 2.0000 mg | INTRAVENOUS | Status: DC | PRN

## 2021-03-06 MED ORDER — SPIRONOLACTONE 12.5 MG HALF TABLET
12.5000 mg | ORAL_TABLET | Freq: Every day | ORAL | Status: DC
  Administered 2021-03-07: 12.5 mg via ORAL
  Filled 2021-03-06 (×3): qty 1

## 2021-03-06 MED ORDER — MAGNESIUM SULFATE 2 GM/50ML IV SOLN: 2.0000 g | Freq: Every day | INTRAVENOUS | Status: DC | PRN

## 2021-03-06 MED ORDER — PHENOL 1.4 % MT LIQD: 1.0000 | OROMUCOSAL | Status: DC | PRN

## 2021-03-06 MED ORDER — METOPROLOL SUCCINATE ER 25 MG PO TB24
25.0000 mg | ORAL_TABLET | Freq: Every day | ORAL | Status: DC
  Administered 2021-03-06 – 2021-03-07 (×2): 25 mg via ORAL
  Filled 2021-03-06 (×2): qty 1

## 2021-03-06 MED ORDER — FENTANYL CITRATE (PF) 250 MCG/5ML IJ SOLN
INTRAMUSCULAR | Status: AC
  Filled 2021-03-06: qty 5

## 2021-03-06 MED ORDER — ONDANSETRON HCL 4 MG/2ML IJ SOLN: 4.0000 mg | Freq: Four times a day (QID) | INTRAMUSCULAR | Status: DC | PRN

## 2021-03-06 MED ORDER — ALUM & MAG HYDROXIDE-SIMETH 200-200-20 MG/5ML PO SUSP: 15.0000 mL | ORAL | Status: DC | PRN

## 2021-03-06 MED ORDER — PANTOPRAZOLE SODIUM 40 MG PO TBEC
40.0000 mg | DELAYED_RELEASE_TABLET | Freq: Every day | ORAL | Status: DC
  Administered 2021-03-07 – 2021-03-12 (×6): 40 mg via ORAL
  Filled 2021-03-06 (×6): qty 1

## 2021-03-06 MED ORDER — CHLORHEXIDINE GLUCONATE 0.12 % MT SOLN
OROMUCOSAL | Status: AC
  Filled 2021-03-06: qty 15

## 2021-03-06 MED ORDER — ALBUMIN HUMAN 5 % IV SOLN
INTRAVENOUS | Status: AC
  Filled 2021-03-06: qty 250

## 2021-03-06 MED ORDER — GABAPENTIN 300 MG PO CAPS
300.0000 mg | ORAL_CAPSULE | Freq: Every day | ORAL | Status: DC
  Administered 2021-03-06 – 2021-03-12 (×7): 300 mg via ORAL
  Filled 2021-03-06 (×7): qty 1

## 2021-03-06 MED ORDER — AMIODARONE HCL 200 MG PO TABS
200.0000 mg | ORAL_TABLET | Freq: Every day | ORAL | Status: DC
  Administered 2021-03-07 – 2021-03-12 (×5): 200 mg via ORAL
  Filled 2021-03-06 (×5): qty 1

## 2021-03-06 MED ORDER — PROPOFOL 10 MG/ML IV BOLUS
INTRAVENOUS | Status: AC
  Filled 2021-03-06: qty 20

## 2021-03-06 MED ORDER — HYDRALAZINE HCL 20 MG/ML IJ SOLN: 5.0000 mg | INTRAMUSCULAR | Status: DC | PRN

## 2021-03-06 MED ORDER — PHENYLEPHRINE 40 MCG/ML (10ML) SYRINGE FOR IV PUSH (FOR BLOOD PRESSURE SUPPORT)
PREFILLED_SYRINGE | INTRAVENOUS | Status: DC | PRN
  Administered 2021-03-06: 80 ug via INTRAVENOUS
  Administered 2021-03-06: 40 ug via INTRAVENOUS

## 2021-03-06 MED ORDER — POTASSIUM CHLORIDE CRYS ER 20 MEQ PO TBCR: 20.0000 meq | EXTENDED_RELEASE_TABLET | Freq: Every day | ORAL | Status: DC | PRN

## 2021-03-06 MED ORDER — ROCURONIUM BROMIDE 10 MG/ML (PF) SYRINGE
PREFILLED_SYRINGE | INTRAVENOUS | Status: DC | PRN
  Administered 2021-03-06: 20 mg via INTRAVENOUS
  Administered 2021-03-06: 10 mg via INTRAVENOUS
  Administered 2021-03-06: 20 mg via INTRAVENOUS
  Administered 2021-03-06: 50 mg via INTRAVENOUS
  Administered 2021-03-06 (×2): 20 mg via INTRAVENOUS

## 2021-03-06 MED ORDER — ACETAMINOPHEN 650 MG RE SUPP: 325.0000 mg | RECTAL | Status: DC | PRN

## 2021-03-06 MED ORDER — PROMETHAZINE HCL 25 MG/ML IJ SOLN: 6.2500 mg | INTRAMUSCULAR | Status: DC | PRN

## 2021-03-06 MED ORDER — PROTAMINE SULFATE 10 MG/ML IV SOLN
INTRAVENOUS | Status: AC
  Filled 2021-03-06: qty 5

## 2021-03-06 MED ORDER — PROTAMINE SULFATE 10 MG/ML IV SOLN
INTRAVENOUS | Status: DC | PRN
  Administered 2021-03-06: 50 mg via INTRAVENOUS

## 2021-03-06 MED ORDER — 0.9 % SODIUM CHLORIDE (POUR BTL) OPTIME
TOPICAL | Status: DC | PRN
  Administered 2021-03-06: 2000 mL

## 2021-03-06 MED ORDER — ATORVASTATIN CALCIUM 40 MG PO TABS
40.0000 mg | ORAL_TABLET | Freq: Every day | ORAL | Status: DC
  Administered 2021-03-07 – 2021-03-12 (×6): 40 mg via ORAL
  Filled 2021-03-06 (×6): qty 1

## 2021-03-06 MED ORDER — METOPROLOL SUCCINATE ER 25 MG PO TB24: 25.0000 mg | ORAL_TABLET | Freq: Every day | ORAL | Status: DC

## 2021-03-06 MED ORDER — MORPHINE SULFATE (PF) 2 MG/ML IV SOLN: 2.0000 mg | INTRAVENOUS | Status: DC | PRN

## 2021-03-06 SURGICAL SUPPLY — 68 items
ADH SKN CLS APL DERMABOND .7 (GAUZE/BANDAGES/DRESSINGS) ×1
APL PRP STRL LF DISP 70% ISPRP (MISCELLANEOUS) ×2
APL SKNCLS STERI-STRIP NONHPOA (GAUZE/BANDAGES/DRESSINGS) ×3
BANDAGE ESMARK 6X9 LF (GAUZE/BANDAGES/DRESSINGS) IMPLANT
BENZOIN TINCTURE PRP APPL 2/3 (GAUZE/BANDAGES/DRESSINGS) ×6 IMPLANT
BNDG CMPR 9X6 STRL LF SNTH (GAUZE/BANDAGES/DRESSINGS) ×1
BNDG ESMARK 6X9 LF (GAUZE/BANDAGES/DRESSINGS) ×2
CANISTER SUCT 3000ML PPV (MISCELLANEOUS) ×2 IMPLANT
CANNULA VESSEL 3MM 2 BLNT TIP (CANNULA) IMPLANT
CATH EMB 4FR 80CM (CATHETERS) ×1 IMPLANT
CHLORAPREP W/TINT 26 (MISCELLANEOUS) ×4 IMPLANT
CLIP VESOCCLUDE MED 24/CT (CLIP) ×2 IMPLANT
CLIP VESOCCLUDE SM WIDE 24/CT (CLIP) ×2 IMPLANT
CUFF TOURN SGL QUICK 24 (TOURNIQUET CUFF) ×2
CUFF TOURN SGL QUICK 34 (TOURNIQUET CUFF)
CUFF TOURN SGL QUICK 42 (TOURNIQUET CUFF) IMPLANT
CUFF TRNQT CYL 24X4X16.5-23 (TOURNIQUET CUFF) IMPLANT
CUFF TRNQT CYL 34X4.125X (TOURNIQUET CUFF) IMPLANT
DERMABOND ADVANCED (GAUZE/BANDAGES/DRESSINGS) ×1
DERMABOND ADVANCED .7 DNX12 (GAUZE/BANDAGES/DRESSINGS) IMPLANT
DRAIN CHANNEL 15F RND FF W/TCR (WOUND CARE) IMPLANT
DRAPE C-ARM 42X72 X-RAY (DRAPES) ×1 IMPLANT
DRAPE HALF SHEET 40X57 (DRAPES) IMPLANT
DRAPE X-RAY CASS 24X20 (DRAPES) ×1 IMPLANT
ELECT REM PT RETURN 9FT ADLT (ELECTROSURGICAL) ×2
ELECTRODE REM PT RTRN 9FT ADLT (ELECTROSURGICAL) ×1 IMPLANT
EVACUATOR SILICONE 100CC (DRAIN) IMPLANT
GAUZE SPONGE 4X4 12PLY STRL (GAUZE/BANDAGES/DRESSINGS) ×2 IMPLANT
GAUZE SPONGE 4X4 16PLY XRAY LF (GAUZE/BANDAGES/DRESSINGS) ×1 IMPLANT
GLOVE BIO SURGEON STRL SZ7.5 (GLOVE) ×3 IMPLANT
GLOVE SURG SS PI 8.0 STRL IVOR (GLOVE) ×2 IMPLANT
GOWN STRL REUS W/ TWL LRG LVL3 (GOWN DISPOSABLE) ×2 IMPLANT
GOWN STRL REUS W/ TWL XL LVL3 (GOWN DISPOSABLE) ×1 IMPLANT
GOWN STRL REUS W/TWL LRG LVL3 (GOWN DISPOSABLE) ×6
GOWN STRL REUS W/TWL XL LVL3 (GOWN DISPOSABLE) ×4
HEMOSTAT SNOW SURGICEL 2X4 (HEMOSTASIS) ×1 IMPLANT
INSERT FOGARTY SM (MISCELLANEOUS) ×1 IMPLANT
KIT BASIN OR (CUSTOM PROCEDURE TRAY) ×2 IMPLANT
KIT MICROPUNCTURE NIT STIFF (SHEATH) ×1 IMPLANT
KIT TURNOVER KIT B (KITS) ×2 IMPLANT
MARKER GRAFT CORONARY BYPASS (MISCELLANEOUS) IMPLANT
NDL 25GX 5/8IN NON SAFETY (NEEDLE) IMPLANT
NEEDLE 25GX 5/8IN NON SAFETY (NEEDLE) ×2 IMPLANT
NS IRRIG 1000ML POUR BTL (IV SOLUTION) ×4 IMPLANT
PACK PERIPHERAL VASCULAR (CUSTOM PROCEDURE TRAY) ×2 IMPLANT
PAD ARMBOARD 7.5X6 YLW CONV (MISCELLANEOUS) ×4 IMPLANT
SET COLLECT BLD 21X3/4 12 (NEEDLE) ×1 IMPLANT
SPONGE LAP 18X36 RFD (DISPOSABLE) ×1 IMPLANT
STOPCOCK 4 WAY LG BORE MALE ST (IV SETS) ×2 IMPLANT
STRIP CLOSURE SKIN 1/2X4 (GAUZE/BANDAGES/DRESSINGS) ×6 IMPLANT
SUT ETHILON 3 0 PS 1 (SUTURE) IMPLANT
SUT MNCRL AB 4-0 PS2 18 (SUTURE) ×5 IMPLANT
SUT PROLENE 5 0 C 1 24 (SUTURE) ×2 IMPLANT
SUT PROLENE 6 0 BV (SUTURE) ×10 IMPLANT
SUT PROLENE 7 0 BV 1 (SUTURE) ×6 IMPLANT
SUT SILK 2 0 SH (SUTURE) ×2 IMPLANT
SUT SILK 3 0 (SUTURE) ×6
SUT SILK 3-0 18XBRD TIE 12 (SUTURE) IMPLANT
SUT VIC AB 2-0 CT1 27 (SUTURE) ×6
SUT VIC AB 2-0 CT1 TAPERPNT 27 (SUTURE) ×2 IMPLANT
SUT VIC AB 3-0 SH 27 (SUTURE) ×6
SUT VIC AB 3-0 SH 27X BRD (SUTURE) ×2 IMPLANT
TAPE UMBILICAL 1/8X18 (MISCELLANEOUS) ×1 IMPLANT
TOWEL GREEN STERILE (TOWEL DISPOSABLE) ×2 IMPLANT
TRAY FOLEY MTR SLVR 16FR STAT (SET/KITS/TRAYS/PACK) ×2 IMPLANT
TUBING EXTENTION W/L.L. (IV SETS) ×1 IMPLANT
UNDERPAD 30X36 HEAVY ABSORB (UNDERPADS AND DIAPERS) ×2 IMPLANT
WATER STERILE IRR 1000ML POUR (IV SOLUTION) ×2 IMPLANT

## 2021-03-06 NOTE — Anesthesia Procedure Notes (Signed)
Procedure Name: Intubation Date/Time: 03/06/2021 10:08 AM Performed by: Ignacia Bayley, CRNA Pre-anesthesia Checklist: Patient identified, Patient being monitored, Timeout performed, Emergency Drugs available and Suction available Patient Re-evaluated:Patient Re-evaluated prior to induction Oxygen Delivery Method: Circle System Utilized Preoxygenation: Pre-oxygenation with 100% oxygen Induction Type: IV induction Ventilation: Mask ventilation without difficulty Laryngoscope Size: Miller and 2 Grade View: Grade II Tube type: Oral Tube size: 7.5 mm Number of attempts: 2 Airway Equipment and Method: stylet and Stylet Placement Confirmation: ETT inserted through vocal cords under direct vision, positive ETCO2 and breath sounds checked- equal and bilateral Secured at: 22 cm Tube secured with: Tape Dental Injury: Teeth and Oropharynx as per pre-operative assessment

## 2021-03-06 NOTE — Discharge Instructions (Addendum)
[facility]  Discharge instructions  Lower Extremity Bypass Surgery  Please refer to the following instruction for your post-procedure care. Your surgeon or physician assistant will discuss any changes with you.  Activity  You are encouraged to walk as much as you can. You can slowly return to normal activities during the month after your surgery. Avoid strenuous activity and heavy lifting until your doctor tells you it's OK. Avoid activities such as vacuuming or swinging a golf club. Do not drive until your doctor give the OK and you are no longer taking prescription pain medications. It is also normal to have difficulty with sleep habits, eating and bowel movement after surgery. These will go away with time.  Bathing/Showering  You may shower after you go home. Do not soak in a bathtub, hot tub, or swim until the incision heals completely.  Incision Care  Clean your incision with mild soap and water. Shower every day. Pat the area dry with a clean towel. You do not need a bandage unless otherwise instructed. Do not apply any ointments or creams to your incision. If you have open wounds you will be instructed how to care for them or a visiting nurse may be arranged for you. If you have staples or sutures along your incision they will be removed at your post-op appointment. You may have skin glue on your incision. Do not peel it off. It will come off on its own in about one week. If you have a great deal of moisture in your groin, use a gauze help keep this area dry.  Diet  Resume your normal diet. There are no special food restrictions following this procedure. A low fat/ low cholesterol diet is recommended for all patients with vascular disease. In order to heal from your surgery, it is CRITICAL to get adequate nutrition. Your body requires vitamins, minerals, and protein. Vegetables are the best source of vitamins and minerals. Vegetables also provide the  perfect balance of protein. Processed food has little nutritional value, so try to avoid this.  Medications  Resume taking all your medications unless your doctor or nurse practitioner tells you not to. If your incision is causing pain, you may take over-the-counter pain relievers such as acetaminophen (Tylenol). If you were prescribed a stronger pain medication, please aware these medication can cause nausea and constipation. Prevent nausea by taking the medication with a snack or meal. Avoid constipation by drinking plenty of fluids and eating foods with high amount of fiber, such as fruits, vegetables, and grains. Take Colase 100 mg (an over-the-counter stool softener) twice a day as needed for constipation. Do not take Tylenol if you are taking prescription pain medications.  --Patient taking coumadin and is being bridged with Lovenox.   --PLEASE DISCONTINUE LOVENOX WHEN INR IS THERAPEUTIC BETWEEN 2-3.   --PATIENT DID RECEIVE DOSE OF COUMADIN ON 03/12/2021 SO PLEASE START COUMADIN ON 03/13/2021.  --PLEASE START LOVENOX ON 03/13/2021.  Follow Up  Our office will schedule a follow up appointment 2-3 weeks following discharge.  Please call us immediately for any of the following conditions  Severe or worsening pain in your legs or feet while at rest or while walking Increase pain, redness, warmth, or drainage (pus) from your incision site(s) Fever of 101 degree or higher The swelling in your leg with the bypass suddenly worsens and becomes more painful than when you were in the hospital If you have been instructed to feel your graft pulse then  you should do so every day. If you can no longer feel this pulse, call the office immediately. Not all patients are given this instruction.  Leg swelling is common after leg bypass surgery.  The swelling should improve over a few months following surgery. To improve the swelling, you may elevate your legs above the level of your heart while you are  sitting or resting. Your surgeon or physician assistant may ask you to apply an ACE wrap or wear compression (TED) stockings to help to reduce swelling.  Reduce your risk of vascular disease  Stop smoking. If you would like help call [facility] at 1-800-QUIT-NOW 815-510-9236) or [address] at [phone].  Manage your cholesterol Maintain a desired weight Control your diabetes weight Control your diabetes Keep your blood pressure down  If you have any questions, please call the office at [phone]

## 2021-03-06 NOTE — Op Note (Signed)
DATE OF SERVICE: 03/06/2021  PATIENT:  Ryan [name]  75 y.o. male  PRE-OPERATIVE DIAGNOSIS:  atherosclerosis of native arteries of right lower extremity causing ulceration  POST-OPERATIVE DIAGNOSIS:  Same  PROCEDURE:   1) right greater saphenous vein harvest 2) right popliteal to peroneal bypass with ipsilateral, translocated, subcutaneous, non-reversed greater saphenous vein  SURGEON:  Surgeon(s) and Role:    * Cherre Robins, MD - Primary    Marty Heck, MD - Assisting  ASSISTANT: Monica Martinez, MD  Risa Grill, PA-C  Paulo Fruit, PA-C  An assistant was required to facilitate exposure and expedite the case.  ANESTHESIA:   general  EBL: 58m  BLOOD ADMINISTERED:none  DRAINS: none  LOCAL MEDICATIONS USED:  NONE  SPECIMEN:  none  COUNTS: confirmed correct.  TOURNIQUET:    Total Tourniquet Time Documented: Thigh (Right) - 6 minutes Thigh (Right) - 46 minutes Total: Thigh (Right) - 52 minutes   PATIENT DISPOSITION:  PACU - hemodynamically stable.   Delay start of Pharmacological VTE agent (>24hrs) due to surgical blood loss or risk of bleeding: no  INDICATION FOR PROCEDURE: TCALVEN SANKERis a 75y.o. male with atherosclerosis of native arteries of right lower extremity causing ulceration. Angiography demonstrated right popliteal occlusion. Peroneal artery reconstituted mid calf. After careful discussion of risks, benefits, and alternatives the patient was offered popliteal - peroneal bypass. We discussed risk of limb loss and lack of better options. The patient understood and wished to proceed.  OPERATIVE FINDINGS:  Adequate greater saphenous vein for conduit. Circumferentially calcified popliteal artery.  Circumferentially calcified peroneal artery. Surgical control of arteries extremely difficult. Tourniquet not effective. Popliteal required double clamping. Peroneal required double clamping.  Completion angiogram shows no technical  problem at proximal or distal anastomosis.  Palpable pulse mid calf marked with an "X" Strongly biphasic doppler flow in peroneal  DESCRIPTION OF PROCEDURE: After identification of the patient in the pre-operative holding area, the patient was transferred to the operating room. The patient was positioned supine on the operating room table. Anesthesia was induced. The right leg was prepped and draped in standard fashion. A surgical pause was performed confirming correct patient, procedure, and operative location.  Using intraoperative ultrasound, the right greater saphenous vein was mapped and marked on the skin.  Skip incisions were made and the greater saphenous vein skeletonized from mid calf to proximal thigh.  All side branches were identified, ligated, and divided.  Once the vein was completely skeletonized from the subcutaneous tissue it was passed off the table and allowed to dwell in a heparinized saline solution.  The peroneal artery was exposed through an incision in the mid calf.  Incision was carried down through subtenons tissue until the fascia of the superficial posterior compartment was identified and divided.  The gastrocnemius was swept posteriorly.  Using intraoperative ultrasound the peroneal was identified, the fascia over the soleus muscle was incised and the peroneal artery skeletonized throughout the mid calf.  It was heavily calcified throughout its length.  No soft site was identified.  The popliteal artery was exposed through an excision in the distal medial thigh.  Incision was carried down through subcutaneous tissue until the sartorius was identified and reflected posteriorly.  The popliteal neurovascular bundle was identified denies.  The popliteal artery was heavily calcified.  We measured the length between the common femoral artery and the peroneal exposure, this was greater than the length of adequate saphenous vein available for conduit.  Elected to bring the  bypass  off the popliteal artery, even though it was circumferentially calcified.  The patient was systemically heparinized.  Anticoagulation was confirmed with activated clotting time measurements throughout the case.  A tourniquet was applied to the proximal thigh.  An Esmarch tourniquet was used to exsanguinate the leg.  The tourniquet was inflated.  A anterior medial arteriotomy was made in the popliteal artery.  Hemostasis was not adequate.  I doubly clamped the inflow to the popliteal artery and had marginal hemostasis.  The greater saphenous vein was brought to the arteriotomy in a nonreverse fashion.  The proximal anastomosis was then sewn end-to-side continuous running fashion with a 6-0 Prolene suture.  Clamps were released on the popliteal artery and the anastomosis was allowed to flush down the conduit.  A palpable pulse was noted in the proximal graft.  Hemostasis was then achieved in the anastomosis with 2 repair stitches.  The the right greater saphenous vein were then lysed with a Jerelene Redden valvulotome.  Pulsatile flow was noted at the distal conduit after valve lysis.  The conduit was then tunneled taking great care to avoid twisting or kinking the graft in a subcutaneous position using the existing saphenectomy incisions.  The conduit was brought to the peroneal artery.  The leg was extended.  The conduit was cut to allow a tension- free anastomosis that was not excessively redundant.  The distal saphenous vein was spatulated to allow end-to-side anastomosis with the peroneal artery.  The peroneal artery was incised.  This artery was heavily calcified.  Tourniquet control was not adequate.  The peroneal artery was doubly clamped.  This provided moderate hemostasis.  An end-to-side anastomosis was performed between the distal conduit and peroneal artery durotomy using a continuous running suture of 7-0 Prolene.  This was very technically challenging given the heavily calcified artery and limited  hemostasis.  Was completed and hemostasis achieved.  Immediately prior to completion the anastomosis was flushed and de-aired.  The clamp on the bypass graft was released and pulsatile flow was achieved into the peroneal artery.  The distal anastomosis was interrogated with Doppler machine.  Biphasic Doppler flow was heard in the distal peroneal artery and the exposure.  This severely blunted with occlusion of the graft.  Given the severely calcified nature of the arteries I elected to perform an angiogram to confirm that the anastomoses were both technically adequate.  I accessed the hood of the proximal anastomosis with micropuncture technique.  Angiogram was performed in 2 projections of the proximal anastomosis.  This showed no technical defect.  Angiogram was then performed of the distal anastomosis.  This showed no technical problem.  The peroneal artery filled both antegrade and retrograde.  Strongly biphasic Doppler signal was heard in the peroneal artery.  Heparin was reversed with protamine.  Hemostasis was achieved in the surgical beds.  The wounds were closed in layers using 2-0 Vicryl, 3-0 Vicryl, 4-0 Monocryl.  Dermabond was applied to the incisions.  Upon completion of the case instrument and sharps counts were confirmed correct. The patient was transferred to the PACU in good condition. I was present for all portions of the procedure.  [name]. [name], MD [facility] Phone Number: [phone] 03/06/2021 4:12 PM

## 2021-03-06 NOTE — H&P (Signed)
See below for H&P details which have not changed. [name] with right lower extremity critical limb ischemia with tissue loss For right above knee popliteal to peroneal bypass GSV may be adequate for bypass - will evaluate in OR Plan to expose peroneal first to determine adequacy for bypass No other options for limb salvage after this procedure. Reviewed the above with the patient.   Ryan [name]. Ryan Breed, MD [facility] Phone Number: [phone] 03/06/2021 9:28 AM          History of Present Illness:  Patient is a 75 y.o. year old male who presents for evaluation of non healing wound.  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  He also had a right great toe ulceration that he was able to heal with wound care around that time.             He now has a GT tip ulcer and a second toe open wound.  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               He is followed by Cardiology with history of history of endocarditis s/p bioprosthetic mitral valve replacement in 2014, atrial fibrillation/flutter, HTN, DM2, PAD (followed by vascular) chronic systolic heart failure, embolic CVA, anticoagulated with coumadin.  He has CKD stage III  SCr down to 1.74 day of d/c (down from peak of 2.4).             Past Medical History:  Diagnosis Date   AICD (automatic cardioverter/defibrillator) present     Arthritis     Atrial fibrillation (HCC)     Cardiomyopathy, dilated (HCC)     CHF (congestive heart failure) (HCC)     Diabetes mellitus without complication ([place])     Endocarditis     Hypertension     Peripheral vascular disease (HCC)     PVC's (premature ventricular contractions)     SVT (supraventricular tachycardia)  long RP             Past Surgical History:  Procedure Laterality Date   ABDOMINAL AORTAGRAM N/A 10/03/2012    Procedure: ABDOMINAL Maxcine Ham;  Surgeon: [name], MD;   Location: [facility] CATH LAB;  Service: Cardiovascular;  Laterality: N/A;   ABDOMINAL AORTOGRAM W/LOWER EXTREMITY N/A 02/25/2021    Procedure: ABDOMINAL AORTOGRAM W/LOWER EXTREMITY;  Surgeon: Cherre Robins, MD;  Location: [place] CV LAB;  Service: Cardiovascular;  Laterality: N/A;   CORONARY ANGIOGRAM   09/21/2012    Procedure: CORONARY ANGIOGRAM;  Surgeon: [name], MD;  Location: [facility] CATH LAB;  Service: Cardiovascular;;   EP IMPLANTABLE DEVICE N/A 06/11/2016    Procedure: ICD Implant;  Surgeon: Will Meredith Leeds, MD;  Location: [place] CV LAB;  Service: Cardiovascular;  Laterality: N/A;   EXTREMITY WIRE/PIN REMOVAL   09/14/2012    Procedure: REMOVAL K-WIRE/PIN EXTREMITY;  Surgeon: [name], MD;  Location: [place];  Service: Orthopedics;  Laterality: Right;  Right Foot   I & D EXTREMITY   09/14/2012    Procedure: IRRIGATION AND DEBRIDEMENT EXTREMITY;  Surgeon: Tennis Must, MD;  Location: [place];  Service: Orthopedics;  Laterality: Right;   INTRAOPERATIVE TRANSESOPHAGEAL ECHOCARDIOGRAM   09/26/2012    Procedure: INTRAOPERATIVE TRANSESOPHAGEAL ECHOCARDIOGRAM;  Surgeon: [name], MD;  Location: [facility] OR;  Service: Open Heart Surgery;  Laterality: N/A;   MITRAL VALVE  REPLACEMENT   09/26/2012    Procedure: MITRAL VALVE (MV) REPLACEMENT;  Surgeon: [name], MD;  Location: [place] OR;  Service: Open Heart Surgery;  Laterality: N/A;   RIGHT HEART CATH N/A 08/29/2020    Procedure: RIGHT HEART CATH;  Surgeon: Larey Dresser, MD;  Location: [place] CV LAB;  Service: Cardiovascular;  Laterality: N/A;   RIGHT HEART CATHETERIZATION   09/21/2012    Procedure: RIGHT HEART CATH;  Surgeon: [name], MD;  Location: [facility] CATH LAB;  Service: Cardiovascular;;   RIGHT/LEFT HEART CATH AND CORONARY ANGIOGRAPHY N/A 01/30/2021    Procedure: RIGHT/LEFT HEART CATH AND CORONARY ANGIOGRAPHY;  Surgeon: Larey Dresser, MD;  Location: [place] CV LAB;  Service: Cardiovascular;  Laterality: N/A;   SVT  ABLATION N/A 09/03/2020    Procedure: SVT ABLATION;  Surgeon: Constance Haw, MD;  Location: [place] CV LAB;  Service: Cardiovascular;  Laterality: N/A;   TEE WITHOUT CARDIOVERSION   09/18/2012    Procedure: TRANSESOPHAGEAL ECHOCARDIOGRAM (TEE);  Surgeon: [name], MD;  Location: [facility] ENDOSCOPY;  Service: Cardiovascular;  Laterality: N/A;      ROS:   General:  No weight loss, Fever, chills   HEENT: No recent headaches, no nasal bleeding, no visual changes, no sore throat   Neurologic: No dizziness, blackouts, seizures. No recent symptoms of stroke or mini- stroke. No recent episodes of slurred speech, or temporary blindness.   Cardiac: No recent episodes of chest pain/pressure, no shortness of breath at rest.  No shortness of breath with exertion.  Denies history of atrial fibrillation or irregular heartbeat   Vascular: No history of rest pain in feet.  No history of claudication.  No history of non-healing ulcer, No history of DVT   Pulmonary: No home oxygen, no productive cough, no hemoptysis,  No asthma or wheezing   Musculoskeletal:  '[ ]'$  Arthritis, '[ ]'$  Low back pain,  '[ ]'$  Joint pain   Hematologic:No history of hypercoagulable state.  No history of easy bleeding.  No history of anemia   Gastrointestinal: No hematochezia or melena,  No gastroesophageal reflux, no trouble swallowing   Urinary: '[ ]'$  chronic Kidney disease, '[ ]'$  on HD - '[ ]'$  MWF or '[ ]'$  TTHS, '[ ]'$  Burning with urination, '[ ]'$  Frequent urination, '[ ]'$  Difficulty urinating;   Skin: No rashes   Psychological: No history of anxiety,  No history of depression   Social History Social History         Tobacco Use   Smoking status: Never   Smokeless tobacco: Never  Vaping Use   Vaping Use: Never used  Substance Use Topics   Alcohol use: No      Alcohol/week: 0.0 standard drinks   Drug use: No      Family History      Family History  Problem Relation Age of Onset   Hypertension Mother     Hypertension  Father        Allergies       Allergies  Allergen Reactions   Geralyn Flash [Fish Allergy] Nausea And Vomiting                 Current Facility-Administered Medications  Medication Dose Route Frequency Provider Last Rate Last Admin   0.9 %  sodium chloride infusion   Intravenous Continuous Cherre Robins, MD       ceFAZolin (ANCEF) IVPB 2g/100 mL premix  2 g Intravenous 30 min Pre-Op Cherre Robins, MD  Chlorhexidine Gluconate Cloth 2 % PADS 6 each  6 each Topical Once Cherre Robins, MD        And   Chlorhexidine Gluconate Cloth 2 % PADS 6 each  6 each Topical Once Cherre Robins, MD       lactated ringers infusion   Intravenous Continuous Nolon Nations, MD 10 mL/hr at 03/06/21 0901 New Bag at 03/06/21 0901   metoprolol succinate (TOPROL-XL) 24 hr tablet 25 mg  25 mg Oral Daily Nolon Nations, MD   25 mg at 03/06/21 0920      Physical Examination      Vitals:    03/06/21 0821  BP: (!) 140/59  Pulse: 75  Resp: 18  Temp: 98.3 F (36.8 C)  TempSrc: Oral  SpO2: 100%  Weight: 70.2 kg  Height: 6' (1.829 m)      Body mass index is 20.98 kg/m.   General:  Alert and oriented, no acute distress HEENT: Normal Neck: No bruit or JVD Pulmonary: Clear to auscultation bilaterally Cardiac: Regular Rate and Rhythm Abdomen: Soft, non-tender, non-distended, no mass, no scars Skin: No rash.  Right GT tip non healing ulcer and kissing ulcer on the right second toe between the GT and second toe.   Musculoskeletal: No deformity or edema      Neurologic: Upper and lower extremity motor 5/5 and symmetric   DATA: ABI Findings:  +---------+------------------+-----+----------+----------------------------  ----+  Right    Rt Pressure (mmHg)IndexWaveform  Comment                            +---------+------------------+-----+----------+----------------------------  ----+  Brachial 132                                                                   +---------+------------------+-----+----------+----------------------------  ----+  PTA      >230                   monophasic                                    +---------+------------------+-----+----------+----------------------------  ----+  DP       43                     monophasic                                    +---------+------------------+-----+----------+----------------------------  ----+  Great Toe                                 Unable to obtain  PPG/pressure                                               due to wound and bandaging          +---------+------------------+-----+----------+----------------------------  ----+   +---------+------------------+-----+----------+-------+  Left  Lt Pressure (mmHg)IndexWaveform  Comment  +---------+------------------+-----+----------+-------+  Brachial 128                                       +---------+------------------+-----+----------+-------+  PTA      >230                   monophasic         +---------+------------------+-----+----------+-------+  DP       >230                   monophasic         +---------+------------------+-----+----------+-------+  Great Toe24                0.18                    +---------+------------------+-----+----------+-------+   +-------+-----------+----------------+-----------------+------------+  ABI/TBIToday's ABIToday's TBI     Previous ABI     Previous TBI  +-------+-----------+----------------+-----------------+------------+  Right  0.33       Unable to obtain0.86 (unreliable)0             +-------+-----------+----------------+-----------------+------------+  Left   [place]         0.18            [place]               0.44          +-------+-----------+----------------+-----------------+------------+        Summary:  Right: Resting right ankle-brachial index indicates severe right lower  extremity arterial  disease.   Left: Resting left ankle-brachial index indicates noncompressible left  lower extremity arteries. The left toe-brachial index is abnormal.     ASSESSMENT/Plan PAD with chronic right GT ulcer on/off ABI's monophasic flow with calcification.   History of angiography 2014  which revealed an occluded right popliteal artery. We had been managing his wound nonoperatively. Foot x ray taken at [facility] office reveals osteomyelitis right second toe. He denies symptoms of rest pain or claudication.   Pre angiogram planning: Coumadin transition with bioprosthetic mitral valve replacement in 2014 and  atrial fibrillation/flutter. CKD stage III plan for Co2     Ryan N. Ryan Breed, MD [facility] Phone Number: [phone] 03/06/2021 9:27 AM     MD on call Donzetta Matters

## 2021-03-06 NOTE — Transfer of Care (Signed)
Immediate Anesthesia Transfer of Care Note  Patient: Ryan [name]  Procedure(s) Performed: RIGHT ABOVE KNEE POPLITEAL ARTERY-PERONEAL BYPASS (Right: Leg Upper)  Patient Location: PACU  Anesthesia Type:General  Level of Consciousness: awake, alert  and oriented  Airway & Oxygen Therapy: Patient Spontanous Breathing and Patient connected to face mask oxygen  Post-op Assessment: Report given to RN and Post -op Vital signs reviewed and stable  Post vital signs: Reviewed and stable  Last Vitals:  Vitals Value Taken Time  BP 80/49 03/06/21 1623  Temp    Pulse 73 03/06/21 1630  Resp 12 03/06/21 1630  SpO2 100 % 03/06/21 1630  Vitals shown include unvalidated device data.  Last Pain:  Vitals:   03/06/21 0838  TempSrc:   PainSc: 6       Patients Stated Pain Goal: 1 ([id number])  Complications: No notable events documented.

## 2021-03-06 NOTE — Anesthesia Postprocedure Evaluation (Signed)
Anesthesia Post Note  Patient: SIMBA ARTINO  Procedure(s) Performed: RIGHT ABOVE KNEE POPLITEAL ARTERY-PERONEAL BYPASS (Right: Leg Upper)     Patient location during evaluation: PACU Anesthesia Type: General Level of consciousness: awake and alert, patient cooperative and oriented Pain control: pain improving. Vital Signs Assessment: post-procedure vital signs reviewed and stable Respiratory status: spontaneous breathing, nonlabored ventilation, respiratory function stable and patient connected to nasal cannula oxygen Cardiovascular status: blood pressure returned to baseline and stable Postop Assessment: no apparent nausea or vomiting Anesthetic complications: no   No notable events documented.  Last Vitals:  Vitals:   03/06/21 1651 03/06/21 1706  BP: (!) 72/47 (!) 78/47  Pulse: 72 71  Resp: 15 14  Temp:    SpO2: 100% 100%    Last Pain:  Vitals:   03/06/21 1651  TempSrc:   PainSc: 10-Worst pain ever                 Sally-Anne Wamble,E. Tyronza Happe

## 2021-03-06 NOTE — Progress Notes (Signed)
Dr. [name] made aware of PT/INR and APTT results from 03/03/21. Verbal order received to repeat labs today.

## 2021-03-06 NOTE — Anesthesia Procedure Notes (Signed)
Arterial Line Insertion Start/End6/17/2022 9:15 AM, 03/06/2021 9:25 AM Performed by: Myna Bright, CRNA, CRNA  Patient location: Pre-op. Preanesthetic checklist: patient identified, IV checked, site marked, risks and benefits discussed, surgical consent, monitors and equipment checked and pre-op evaluation Lidocaine 1% used for infiltration Left, radial was placed Catheter size: 20 G Hand hygiene performed  and maximum sterile barriers used  Allen's test indicative of satisfactory collateral circulation Attempts: 1 Procedure performed without using ultrasound guided technique. Following insertion, Biopatch and dressing applied. Post procedure assessment: normal  Patient tolerated the procedure well with no immediate complications.

## 2021-03-06 NOTE — Progress Notes (Signed)
Pt arrived to unit from  PACU, A/O x 4,  CCMD called ,CHG given, pt oriented to unit, sacral pad placed pt has little pinkness on coccyx. Pt states he lives alone. Right leg cold to touch put pulses  are heard by Doppler Will continue to monitor.   Albin Felling Sherill Mangen, RN    03/06/21 1831  Vitals  Temp 98.1 F (36.7 C)  Temp Source Oral  BP (!) 93/54  MAP (mmHg) 66  BP Location Right Arm  BP Method Automatic  Patient Position (if appropriate) Lying  Pulse Rate 76  Pulse Rate Source Monitor  ECG Heart Rate 76  Resp 16  Level of Consciousness  Level of Consciousness Alert  Oxygen Therapy  SpO2 100 %  O2 Device Room Air  Art Line  Arterial Line BP 121/36  Arterial Line MAP (mmHg) 53 mmHg  ECG Monitoring  Cardiac Rhythm NSR  Glasgow Coma Scale  Eye Opening 4  Best Verbal Response (NON-intubated) 5  Best Motor Response 6  Glasgow Coma Scale Score 15  MEWS Score  MEWS Temp 0  MEWS Systolic 1  MEWS Pulse 0  MEWS RR 0  MEWS LOC 0  MEWS Score 1  MEWS Score Color Green

## 2021-03-06 NOTE — Progress Notes (Signed)
See below for H&P details which have not changed. [name] with right lower extremity critical limb ischemia with tissue loss For right above knee popliteal to peroneal bypass GSV may be adequate for bypass - will evaluate in OR Plan to expose peroneal first to determine adequacy for bypass No other options for limb salvage after this procedure. Reviewed the above with the patient.  Ryan [name]. Ryan Breed, MD [facility] Phone Number: [phone] 03/06/2021 9:28 AM       History of Present Illness:  Patient is a 75 y.o. year old male who presents for evaluation of non healing wound.  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  He also had a right great toe ulceration that he was able to heal with wound care around that time.  He now has a GT tip ulcer and a second toe open wound.  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    He is followed by Cardiology with history of history of endocarditis s/p bioprosthetic mitral valve replacement in 2014, atrial fibrillation/flutter, HTN, DM2, PAD (followed by vascular) chronic systolic heart failure, embolic CVA, anticoagulated with coumadin.  He has CKD stage III  SCr down to 1.74 day of d/c (down from peak of 2.4).      Past Medical History:  Diagnosis Date   AICD (automatic cardioverter/defibrillator) present    Arthritis    Atrial fibrillation (HCC)    Cardiomyopathy, dilated (HCC)    CHF (congestive heart failure) (HCC)    Diabetes mellitus without complication ([place])    Endocarditis    Hypertension    Peripheral vascular disease (HCC)    PVC's (premature ventricular contractions)    SVT (supraventricular tachycardia)  long RP     Past Surgical History:  Procedure Laterality Date   ABDOMINAL AORTAGRAM N/A 10/03/2012   Procedure: ABDOMINAL Maxcine Ham;  Surgeon: [name], MD;  Location: [facility] CATH LAB;  Service: Cardiovascular;   Laterality: N/A;   ABDOMINAL AORTOGRAM W/LOWER EXTREMITY N/A 02/25/2021   Procedure: ABDOMINAL AORTOGRAM W/LOWER EXTREMITY;  Surgeon: Cherre Robins, MD;  Location: [place] CV LAB;  Service: Cardiovascular;  Laterality: N/A;   CORONARY ANGIOGRAM  09/21/2012   Procedure: CORONARY ANGIOGRAM;  Surgeon: [name], MD;  Location: [facility] CATH LAB;  Service: Cardiovascular;;   EP IMPLANTABLE DEVICE N/A 06/11/2016   Procedure: ICD Implant;  Surgeon: Will Meredith Leeds, MD;  Location: [place] CV LAB;  Service: Cardiovascular;  Laterality: N/A;   EXTREMITY WIRE/PIN REMOVAL  09/14/2012   Procedure: REMOVAL K-WIRE/PIN EXTREMITY;  Surgeon: [name], MD;  Location: [place];  Service: Orthopedics;  Laterality: Right;  Right Foot   I & D EXTREMITY  09/14/2012   Procedure: IRRIGATION AND DEBRIDEMENT EXTREMITY;  Surgeon: Tennis Must, MD;  Location: [place];  Service: Orthopedics;  Laterality: Right;   INTRAOPERATIVE TRANSESOPHAGEAL ECHOCARDIOGRAM  09/26/2012   Procedure: INTRAOPERATIVE TRANSESOPHAGEAL ECHOCARDIOGRAM;  Surgeon: [name], MD;  Location: [facility] OR;  Service: Open Heart Surgery;  Laterality: N/A;   MITRAL VALVE REPLACEMENT  09/26/2012   Procedure: MITRAL VALVE (MV) REPLACEMENT;  Surgeon: [name], MD;  Location: [place] OR;  Service: Open Heart Surgery;  Laterality: N/A;   RIGHT HEART CATH N/A 08/29/2020   Procedure: RIGHT HEART CATH;  Surgeon: Larey Dresser, MD;  Location: [place] CV LAB;  Service: Cardiovascular;  Laterality: N/A;   RIGHT  HEART CATHETERIZATION  09/21/2012   Procedure: RIGHT HEART CATH;  Surgeon: [name], MD;  Location: [facility] CATH LAB;  Service: Cardiovascular;;   RIGHT/LEFT HEART CATH AND CORONARY ANGIOGRAPHY N/A 01/30/2021   Procedure: RIGHT/LEFT HEART CATH AND CORONARY ANGIOGRAPHY;  Surgeon: Larey Dresser, MD;  Location: [place] CV LAB;  Service: Cardiovascular;  Laterality: N/A;   SVT ABLATION N/A 09/03/2020   Procedure: SVT ABLATION;  Surgeon:  Constance Haw, MD;  Location: [place] CV LAB;  Service: Cardiovascular;  Laterality: N/A;   TEE WITHOUT CARDIOVERSION  09/18/2012   Procedure: TRANSESOPHAGEAL ECHOCARDIOGRAM (TEE);  Surgeon: [name], MD;  Location: [facility] ENDOSCOPY;  Service: Cardiovascular;  Laterality: N/A;    ROS:   General:  No weight loss, Fever, chills  HEENT: No recent headaches, no nasal bleeding, no visual changes, no sore throat  Neurologic: No dizziness, blackouts, seizures. No recent symptoms of stroke or mini- stroke. No recent episodes of slurred speech, or temporary blindness.  Cardiac: No recent episodes of chest pain/pressure, no shortness of breath at rest.  No shortness of breath with exertion.  Denies history of atrial fibrillation or irregular heartbeat  Vascular: No history of rest pain in feet.  No history of claudication.  No history of non-healing ulcer, No history of DVT   Pulmonary: No home oxygen, no productive cough, no hemoptysis,  No asthma or wheezing  Musculoskeletal:  '[ ]'$  Arthritis, '[ ]'$  Low back pain,  '[ ]'$  Joint pain  Hematologic:No history of hypercoagulable state.  No history of easy bleeding.  No history of anemia  Gastrointestinal: No hematochezia or melena,  No gastroesophageal reflux, no trouble swallowing  Urinary: '[ ]'$  chronic Kidney disease, '[ ]'$  on HD - '[ ]'$  MWF or '[ ]'$  TTHS, '[ ]'$  Burning with urination, '[ ]'$  Frequent urination, '[ ]'$  Difficulty urinating;   Skin: No rashes  Psychological: No history of anxiety,  No history of depression  Social History Social History   Tobacco Use   Smoking status: Never   Smokeless tobacco: Never  Vaping Use   Vaping Use: Never used  Substance Use Topics   Alcohol use: No    Alcohol/week: 0.0 standard drinks   Drug use: No    Family History Family History  Problem Relation Age of Onset   Hypertension Mother    Hypertension Father     Allergies  Allergies  Allergen Reactions   Geralyn Flash [Fish Allergy] Nausea And  Vomiting     Current Facility-Administered Medications  Medication Dose Route Frequency Provider Last Rate Last Admin   0.9 %  sodium chloride infusion   Intravenous Continuous Cherre Robins, MD       ceFAZolin (ANCEF) IVPB 2g/100 mL premix  2 g Intravenous 30 min Pre-Op Cherre Robins, MD       Chlorhexidine Gluconate Cloth 2 % PADS 6 each  6 each Topical Once Cherre Robins, MD       And   Chlorhexidine Gluconate Cloth 2 % PADS 6 each  6 each Topical Once Cherre Robins, MD       lactated ringers infusion   Intravenous Continuous Nolon Nations, MD 10 mL/hr at 03/06/21 0901 New Bag at 03/06/21 0901   metoprolol succinate (TOPROL-XL) 24 hr tablet 25 mg  25 mg Oral Daily Nolon Nations, MD   25 mg at 03/06/21 0920    Physical Examination  Vitals:   03/06/21 0821  BP: (!) 140/59  Pulse: 75  Resp:  18  Temp: 98.3 F (36.8 C)  TempSrc: Oral  SpO2: 100%  Weight: 70.2 kg  Height: 6' (1.829 m)    Body mass index is 20.98 kg/m.  General:  Alert and oriented, no acute distress HEENT: Normal Neck: No bruit or JVD Pulmonary: Clear to auscultation bilaterally Cardiac: Regular Rate and Rhythm  Abdomen: Soft, non-tender, non-distended, no mass, no scars Skin: No rash.  Right GT tip non healing ulcer and kissing ulcer on the right second toe between the GT and second toe.  Musculoskeletal: No deformity or edema  Neurologic: Upper and lower extremity motor 5/5 and symmetric  DATA:  ABI Findings:  +---------+------------------+-----+----------+----------------------------  ----+  Right    Rt Pressure (mmHg)IndexWaveform  Comment                            +---------+------------------+-----+----------+----------------------------  ----+  Brachial 132                                                                  +---------+------------------+-----+----------+----------------------------  ----+  PTA      >230                   monophasic                                     +---------+------------------+-----+----------+----------------------------  ----+  DP       43                     monophasic                                    +---------+------------------+-----+----------+----------------------------  ----+  Great Toe                                 Unable to obtain  PPG/pressure                                               due to wound and bandaging          +---------+------------------+-----+----------+----------------------------  ----+   +---------+------------------+-----+----------+-------+  Left     Lt Pressure (mmHg)IndexWaveform  Comment  +---------+------------------+-----+----------+-------+  Brachial 128                                       +---------+------------------+-----+----------+-------+  PTA      >230                   monophasic         +---------+------------------+-----+----------+-------+  DP       >230                   monophasic         +---------+------------------+-----+----------+-------+  Toni Arthurs  0.18                    +---------+------------------+-----+----------+-------+   +-------+-----------+----------------+-----------------+------------+  ABI/TBIToday's ABIToday's TBI     Previous ABI     Previous TBI  +-------+-----------+----------------+-----------------+------------+  Right  0.33       Unable to obtain0.86 (unreliable)0             +-------+-----------+----------------+-----------------+------------+  Left   [place]         0.18            [place]               0.44          +-------+-----------+----------------+-----------------+------------+        Summary:  Right: Resting right ankle-brachial index indicates severe right lower  extremity arterial disease.   Left: Resting left ankle-brachial index indicates noncompressible left  lower extremity arteries. The left toe-brachial index is  abnormal.    ASSESSMENT/Plan PAD with chronic right GT ulcer on/off ABI's monophasic flow with calcification.   History of angiography 2014  which revealed an occluded right popliteal artery. We had been managing his wound nonoperatively. Foot x ray taken at [facility] office reveals osteomyelitis right second toe. He denies symptoms of rest pain or claudication.  Pre angiogram planning: Coumadin transition with bioprosthetic mitral valve replacement in 2014 and  atrial fibrillation/flutter. CKD stage III plan for Co2   Ryan N. Ryan Breed, MD [facility] Phone Number: [phone] 03/06/2021 9:27 AM   MD on call Donzetta Matters

## 2021-03-07 LAB — LIPID PANEL
Cholesterol: 100 mg/dL (ref 0–200)
HDL: 35 mg/dL — ABNORMAL LOW (ref >40)
LDL Cholesterol: 59 mg/dL (ref 0–99)
Total CHOL/HDL Ratio: 2.9 RATIO
Triglycerides: 31 mg/dL (ref <150)
VLDL: 6 mg/dL (ref 0–40)

## 2021-03-07 LAB — POCT ACTIVATED CLOTTING TIME
Activated Clotting Time: 213 seconds
Activated Clotting Time: 225 seconds
Activated Clotting Time: 231 seconds
Activated Clotting Time: 248 seconds
Activated Clotting Time: 237 seconds

## 2021-03-07 LAB — CBC
HCT: 22.2 % — ABNORMAL LOW (ref 39.0–52.0)
HCT: 29.3 % — ABNORMAL LOW (ref 39.0–52.0)
Hemoglobin: 6.8 g/dL — CL (ref 13.0–17.0)
Hemoglobin: 9.1 g/dL — ABNORMAL LOW (ref 13.0–17.0)
MCH: 24.3 pg — ABNORMAL LOW (ref 26.0–34.0)
MCH: 25.7 pg — ABNORMAL LOW (ref 26.0–34.0)
MCHC: 30.6 g/dL (ref 30.0–36.0)
MCHC: 31.1 g/dL (ref 30.0–36.0)
MCV: 79.3 fL — ABNORMAL LOW (ref 80.0–100.0)
MCV: 82.8 fL (ref 80.0–100.0)
Platelets: 102 10*3/uL — ABNORMAL LOW (ref 150–400)
Platelets: 91 10*3/uL — ABNORMAL LOW (ref 150–400)
RBC: 2.8 MIL/uL — ABNORMAL LOW (ref 4.22–5.81)
RBC: 3.54 MIL/uL — ABNORMAL LOW (ref 4.22–5.81)
RDW: 17.3 % — ABNORMAL HIGH (ref 11.5–15.5)
RDW: 17 % — ABNORMAL HIGH (ref 11.5–15.5)
WBC: 10 10*3/uL (ref 4.0–10.5)
WBC: 10.7 10*3/uL — ABNORMAL HIGH (ref 4.0–10.5)
nRBC: 0 % (ref 0.0–0.2)
nRBC: 0 % (ref 0.0–0.2)

## 2021-03-07 LAB — BASIC METABOLIC PANEL
Anion gap: 6 (ref 5–15)
BUN: 29 mg/dL — ABNORMAL HIGH (ref 8–23)
CO2: 21 mmol/L — ABNORMAL LOW (ref 22–32)
Calcium: 7.9 mg/dL — ABNORMAL LOW (ref 8.9–10.3)
Chloride: 113 mmol/L — ABNORMAL HIGH (ref 98–111)
Creatinine, Ser: 1.74 mg/dL — ABNORMAL HIGH (ref 0.61–1.24)
GFR, Estimated: 40 mL/min — ABNORMAL LOW (ref >60)
Glucose, Bld: 145 mg/dL — ABNORMAL HIGH (ref 70–99)
Potassium: 4.7 mmol/L (ref 3.5–5.1)
Sodium: 140 mmol/L (ref 135–145)

## 2021-03-07 LAB — GLUCOSE, CAPILLARY
Glucose-Capillary: 145 mg/dL — ABNORMAL HIGH (ref 70–99)
Glucose-Capillary: 145 mg/dL — ABNORMAL HIGH (ref 70–99)
Glucose-Capillary: 127 mg/dL — ABNORMAL HIGH (ref 70–99)

## 2021-03-07 LAB — APTT: aPTT: 59 seconds — ABNORMAL HIGH (ref 24–36)

## 2021-03-07 MED ORDER — SODIUM CHLORIDE 0.9% IV SOLUTION: Freq: Once | INTRAVENOUS | Status: DC

## 2021-03-07 NOTE — Progress Notes (Signed)
Critical HGb of 6.8 called from lab spoke to Dr. Trula Slade who ordered 2 units of PRBC

## 2021-03-07 NOTE — Progress Notes (Signed)
Pt s/p 2units of blood today.  Last unit completed around 4pm.  BP hypotensive but stable, been holding 80s/40-50s most of the day.  Follow up CBC to be drawn now.    Foley discontinued at 0610 this morning, no UOP today.  Bladder scan showing 106cc.    Dr. Trula Slade notified of pt status.  No new orders received, except to ensure a.m. CBC.  Will continue to monitor and likely to get cardiology involved tomorrow.

## 2021-03-07 NOTE — Progress Notes (Signed)
PHARMACIST LIPID MONITORING   Ryan [name] is a 75 y.o. male admitted on 03/06/2021 with PAD requiring bypass.  Pharmacy has been consulted to optimize lipid-lowering therapy with the indication of secondary prevention for clinical ASCVD.  Recent Labs:  Lipid Panel (last 6 months):   Lab Results  Component Value Date   CHOL 100 03/07/2021   TRIG 31 03/07/2021   HDL 35 (L) 03/07/2021   CHOLHDL 2.9 03/07/2021   VLDL 6 03/07/2021   LDLCALC 59 03/07/2021    Hepatic function panel (last 6 months):   Lab Results  Component Value Date   AST 55 (H) 03/03/2021   ALT 46 (H) 03/03/2021   ALKPHOS 90 03/03/2021   BILITOT 1.0 03/03/2021    SCr (since admission):   Serum creatinine: 1.74 mg/dL (H) 03/07/21 0000 Estimated creatinine clearance: 35.3 mL/min (A)  Current therapy and lipid therapy tolerance Current lipid-lowering therapy: Atorvastatin '40mg'$  daily Documented or reported allergies or intolerances to lipid-lowering therapies (if applicable): none  Assessment:   Patient with LDL at 59 (below goal of <70), LDL was 107 one month ago, prior to starting atorvastatin, so likely will have some additional lowering effects from current dose.  Plan:    1.Statin intensity (high intensity recommended for all patients regardless of the LDL):  No statin changes. The patient is already on a high intensity statin.  2.Add ezetimibe (if any one of the following):   Not indicated at this time.  3.Refer to lipid clinic:   No  4.Follow-up with:  Cardiology provider - [name], MD  5.Follow-up labs after discharge:  No changes in lipid therapy, repeat a lipid panel in one year.       Norina Buzzard, PharmD PGY1 Pharmacy Resident 03/07/2021 11:00 AM

## 2021-03-07 NOTE — Progress Notes (Signed)
Addendum: Patient to receive second unit of 2 units ordered now. I refected in my earlier note that he was already receiving second unit in error. Discussed hypotension with Dr. Trula Slade and attendant RN. Will increase monitoring BP every hour and hope to avoid IV fluid resuscitation currently given history of CHF.

## 2021-03-07 NOTE — Progress Notes (Signed)
Foley discontinued at 0610 this morning per report.  Pt hasn't yet voided, bladder scan showing 76cc.  Will continue to monitor.

## 2021-03-07 NOTE — Progress Notes (Addendum)
VASCULAR SURGERY ASSESSMENT & PLAN:   1 Day Post-Op PAD; atherosclerosis of native arteries of right lower extremity causing ulceration. S/p right pop-to peroneal bypass with vein. RLE well perfused. BP soft. Expect transfusion will improve this and need to continue to monitor. Will continue his home meds.  Acute BLA: Receiving 2nd of 2 units PRBCs.   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 On heparin infusion at 500 units/hour. Close monitoring of fluid balance.  Recent right and left heart cath (May 2022) without evidence of CAD.  History of CKD stage 3: Cr = 1.74 (baseline 1.9) UOP = 1025 cc  SUBJECTIVE:   Complaining of incisional pain. Denies CP, SOB.  PHYSICAL EXAM:   Vitals:   03/06/21 2200 03/06/21 2354 03/07/21 0340 03/07/21 0800  BP: (!) 94/52 (!) 95/46 (!) 94/46 (!) 86/48  Pulse: 69 70 68 65  Resp: '16 16 14 15  '$ Temp:  97.9 F (36.6 C) 99.4 F (37.4 C) 98.9 F (37.2 C)  TempSrc:  Oral Oral Oral  SpO2: 100% 100% 100%   Weight:      Height:       General appearance: Awake, alert in no apparent distress Cardiac: Heart rate and rhythm are regular Respirations: Nonlabored Incisions: Right thigh and lower leg incisions are all well approximated without bleeding or hematoma Extremities: Both feet are warm with intact sensation and motor function.  Ischemic changes noted and unchanged of right great toe. Pulse/Doppler exam: I am unable to appreciate a palpable pulse in the graft, however there is a strong biphasic Doppler signal in it and brisk right dorsalis pedis, posterior tibial and peroneal artery Doppler signals.  LABS:   Lab Results  Component Value Date   WBC 10.0 03/07/2021   HGB 6.8 (LL) 03/07/2021   HCT 22.2 (L) 03/07/2021   MCV 79.3 (L) 03/07/2021   PLT 102 (L) 03/07/2021   Lab Results  Component Value Date   CREATININE 1.74 (H) 03/07/2021   Lab Results  Component Value Date   INR 1.2  03/06/2021   CBG (last 3)  Recent Labs    03/06/21 1628 03/06/21 2105 03/07/21 0610  GLUCAP 111* 109* 145*    PROBLEM LIST:    Active Problems:   Popliteal artery occlusion, right (HCC)   PAD (peripheral artery disease) (HCC)   CURRENT MEDS:    sodium chloride   Intravenous Once   sodium chloride   Intravenous Once   amiodarone  200 mg Oral Daily   atorvastatin  40 mg Oral q1800   docusate sodium  100 mg Oral Daily   empagliflozin  10 mg Oral Daily   gabapentin  300 mg Oral QHS   metoprolol succinate  25 mg Oral Daily   pantoprazole  40 mg Oral Daily   sacubitril-valsartan  1 tablet Oral BID   spironolactone  12.5 mg Oral Daily   tamsulosin  0.4 mg Oral Daily    Ryan Banner, PA-C  Office: [phone] 03/07/2021   I agree with the above.  I have seen and evaluated the patient.  He is resting comfortably in bed.  He has a palpable bypass graft pulse in the mid calf with brisk peroneal Doppler signal  Acute blood loss anemia: Hemoglobin was 6.8 this morning.  He is receiving 2 units of blood.  He is currently getting heparin running at 500 an hour with plans to increase this to a therapeutic dose, however I will delay this  1 more day given his blood requirement.  I will increase to 1000 units/hour.  Creatinine remains stable at 7 4.  Continue statin therapy  Levels Chanti Golubski

## 2021-03-07 NOTE — Progress Notes (Addendum)
Consulted with vascular PA prior to administering scheduled metoprolol, aldactone, entresto due to pt hypotension. Due to pt CHF and pt receiving 2 units of PRBCs due to postop anemia, it was decided to administer all meds as scheduled and continue to closely monitor.

## 2021-03-07 NOTE — Progress Notes (Signed)
PT Cancellation Note  Patient Details Name: Ryan [name] MRN: [id number] DOB: 1945-10-27   Cancelled Treatment:    Reason Eval/Treat Not Completed: Medical issues which prohibited therapy. Pt hypotensive and anemic, receiving blood products. PT will attempt to follow up when the pt is more medically stable.   [name] 03/07/2021, 2:28 PM

## 2021-03-07 NOTE — Progress Notes (Signed)
OT Cancellation Note  Patient Details Name: Ryan [name] MRN: [id number] DOB: Jul 22, 1946   Cancelled Treatment:    Reason Eval/Treat Not Completed: Patient not medically ready.  RN advised patient receiving blood, and BP remains low along with HGB.  OT to continue efforts in the acute setting.    Tauno Falotico D Gentri Guardado 03/07/2021, 12:16 PM 03/07/2021  Rich, OTR/L  Acute Rehabilitation Services  Office:  [phone]

## 2021-03-08 LAB — CBC
HCT: 26.6 % — ABNORMAL LOW (ref 39.0–52.0)
Hemoglobin: 8.5 g/dL — ABNORMAL LOW (ref 13.0–17.0)
MCH: 25.8 pg — ABNORMAL LOW (ref 26.0–34.0)
MCHC: 32 g/dL (ref 30.0–36.0)
MCV: 80.6 fL (ref 80.0–100.0)
Platelets: 80 10*3/uL — ABNORMAL LOW (ref 150–400)
RBC: 3.3 MIL/uL — ABNORMAL LOW (ref 4.22–5.81)
RDW: 17.2 % — ABNORMAL HIGH (ref 11.5–15.5)
WBC: 10.8 10*3/uL — ABNORMAL HIGH (ref 4.0–10.5)
nRBC: 0 % (ref 0.0–0.2)

## 2021-03-08 LAB — BASIC METABOLIC PANEL
Anion gap: 8 (ref 5–15)
BUN: 36 mg/dL — ABNORMAL HIGH (ref 8–23)
CO2: 19 mmol/L — ABNORMAL LOW (ref 22–32)
Calcium: 8 mg/dL — ABNORMAL LOW (ref 8.9–10.3)
Chloride: 109 mmol/L (ref 98–111)
Creatinine, Ser: 2.37 mg/dL — ABNORMAL HIGH (ref 0.61–1.24)
GFR, Estimated: 28 mL/min — ABNORMAL LOW (ref >60)
Glucose, Bld: 135 mg/dL — ABNORMAL HIGH (ref 70–99)
Potassium: 4.8 mmol/L (ref 3.5–5.1)
Sodium: 136 mmol/L (ref 135–145)

## 2021-03-08 LAB — APTT: aPTT: 66 seconds — ABNORMAL HIGH (ref 24–36)

## 2021-03-08 MED ORDER — WARFARIN - PHYSICIAN DOSING INPATIENT: Freq: Every day | Status: DC

## 2021-03-08 MED ORDER — WARFARIN SODIUM 5 MG PO TABS
5.0000 mg | ORAL_TABLET | Freq: Every day | ORAL | Status: DC
  Administered 2021-03-08 – 2021-03-11 (×4): 5 mg via ORAL
  Filled 2021-03-08 (×4): qty 1

## 2021-03-08 MED ORDER — MIDODRINE HCL 5 MG PO TABS
2.5000 mg | ORAL_TABLET | Freq: Three times a day (TID) | ORAL | Status: DC
  Administered 2021-03-08 – 2021-03-09 (×4): 2.5 mg via ORAL
  Filled 2021-03-08 (×4): qty 1

## 2021-03-08 NOTE — Evaluation (Signed)
Physical Therapy Evaluation Patient Details Name: Ryan [name] MRN: [id number] DOB: [birth year] Today's Date: 03/08/2021   History of Present Illness  Pt is a 75 y/o male presenting for evaluation of non healing wound. Pt underwent R above knee popliteal artery-peroneal bypass 6/17.  PMH includes: endocarditis s/p bioprosthetic mitral valve replacement in 2014, atrial fibrillation/flutter, HTN, DM2, PAD, CKD stage III, chronic systolic heart failure, embolic CVA, anticoagulated with coumadin  Clinical Impression  Pt demonstrates deficits in strength, functional mobility, activity tolerance, balance, and gait. Pt requires physical assistance to perform bed mobility and transfers, becoming hypotensive with reports of dizziness on standing. Ambulation deferred secondary to hypotension. Pt will benefit from acute PT services to improve safety and independence in functional mobility. SPT recommends SNF placement to maximize independence in mobility and aid in return to prior level.     Follow Up Recommendations SNF    Equipment Recommendations  3in1 (PT);Wheelchair (measurements PT);Wheelchair cushion (measurements PT)    Recommendations for Other Services       Precautions / Restrictions Precautions Precautions: Fall Restrictions Weight Bearing Restrictions: No      Mobility  Bed Mobility Overal bed mobility: Needs Assistance Bed Mobility: Supine to Sit;Sit to Supine     Supine to sit: Min assist;HOB elevated Sit to supine: Min assist   General bed mobility comments: min A for trunk and LEs.    Transfers Overall transfer level: Needs assistance Equipment used: Rolling walker (2 wheeled) Transfers: Sit to/from Stand Sit to Stand: Mod assist;+2 physical assistance         General transfer comment: Pt with reports of dizziness on standing. Return to sitting at EOB, BP- 55/36. Pt assisted back to supine and BP taken- 96/43  Ambulation/Gait             General Gait  Details: deferred secondary to hypotensive and symptomatic.  Stairs            Wheelchair Mobility    Modified Rankin (Stroke Patients Only)       Balance Overall balance assessment: Needs assistance Sitting-balance support: Feet supported;Single extremity supported;Bilateral upper extremity supported Sitting balance-Leahy Scale: Poor Sitting balance - Comments: Pt reliant on UE support and external support   Standing balance support: Bilateral upper extremity supported;During functional activity Standing balance-Leahy Scale: Poor Standing balance comment: pt reliant on bil UE support from RW and external support                             Pertinent Vitals/Pain Pain Assessment: Faces Faces Pain Scale: Hurts even more Pain Location: Incision site, pain all over. Pain Descriptors / Indicators: Discomfort;Grimacing;Guarding Pain Intervention(s): Monitored during session;Limited activity within patient's tolerance    Home Living Family/patient expects to be discharged to:: Private residence Living Arrangements: Alone Available Help at Discharge: Family;Available PRN/intermittently Type of Home: House Home Access: Stairs to enter Entrance Stairs-Rails: None Entrance Stairs-Number of Steps: 1 Home Layout: One level Home Equipment: Walker - 4 wheels;Cane - single point;Toilet riser;Shower seat      Prior Function Level of Independence: Independent with assistive device(s)         Comments: pt uses cane for household mobility and rollator for mobility in the community. plan was to move to daughter's house in [place].     Hand Dominance        Extremity/Trunk Assessment   Upper Extremity Assessment Upper Extremity Assessment: Generalized weakness    Lower Extremity Assessment Lower  Extremity Assessment: Generalized weakness       Communication   Communication: No difficulties  Cognition Arousal/Alertness: Awake/alert Behavior During  Therapy: WFL for tasks assessed/performed Overall Cognitive Status: Within Functional Limits for tasks assessed                                        General Comments General comments (skin integrity, edema, etc.): Pt reports to be in pain all over.    Exercises     Assessment/Plan    PT Assessment Patient needs continued PT services  PT Problem List Decreased strength;Decreased range of motion;Decreased activity tolerance;Decreased balance;Decreased mobility;Decreased knowledge of use of DME;Cardiopulmonary status limiting activity;Pain       PT Treatment Interventions DME instruction;Gait training;Therapeutic activities;Functional mobility training;Therapeutic exercise;Balance training;Patient/family education    PT Goals (Current goals can be found in the Care Plan section)  Acute Rehab PT Goals Patient Stated Goal: get stronger and go home PT Goal Formulation: With patient Time For Goal Achievement: 03/22/21 Potential to Achieve Goals: Good    Frequency Min 2X/week   Barriers to discharge        Co-evaluation               AM-PAC PT "6 Clicks" Mobility  Outcome Measure Help needed turning from your back to your side while in a flat bed without using bedrails?: A Little Help needed moving from lying on your back to sitting on the side of a flat bed without using bedrails?: A Little Help needed moving to and from a bed to a chair (including a wheelchair)?: A Lot Help needed standing up from a chair using your arms (e.g., wheelchair or bedside chair)?: A Lot Help needed to walk in hospital room?: A Lot Help needed climbing 3-5 steps with a railing? : Total 6 Click Score: 13    End of Session Equipment Utilized During Treatment: Gait belt Activity Tolerance: Treatment limited secondary to medical complications (Comment);Patient limited by pain (hypotensive and symptomatic) Patient left: in bed;with call bell/phone within reach;with bed alarm  set;with family/visitor present Nurse Communication: Mobility status;Other (comment) (BP) PT Visit Diagnosis: Unsteadiness on feet (R26.81);Other abnormalities of gait and mobility (R26.89);Muscle weakness (generalized) (M62.81);Difficulty in walking, not elsewhere classified (R26.2);Dizziness and giddiness (R42);Pain Pain - Right/Left: Right Pain - part of body: Leg    Time: [id number] PT Time Calculation (min) (ACUTE ONLY): 40 min   Charges:   PT Evaluation $PT Eval Low Complexity: 1 Low PT Treatments $Therapeutic Activity: 8-22 mins        Acute Rehab  Pager: [phone]   Garwin Brothers, SPT  03/08/2021, 1:36 PM

## 2021-03-08 NOTE — Plan of Care (Signed)
  Problem: Education: Goal: Knowledge of General Education information will improve Description Including pain rating scale, medication(s)/side effects and non-pharmacologic comfort measures Outcome: Progressing   

## 2021-03-08 NOTE — Progress Notes (Addendum)
ANTICOAGULATION CONSULT NOTE - Initial Consult  Pharmacy Consult for heparin Indication: atrial fibrillation  Allergies  Allergen Reactions   Tuna [Fish Allergy] Nausea And Vomiting    Patient Measurements: Height: 6' (182.9 cm) Weight: 76.4 kg (168 lb 6.9 oz) IBW/kg (Calculated) : 77.6 Heparin Dosing Weight: 70.2  Vital Signs: Temp: 98.8 F (37.1 C) (06/19 0401) Temp Source: Oral (06/19 0401) BP: 90/47 (06/19 0401) Pulse Rate: 62 (06/19 0401)  Labs: Recent Labs    03/06/21 0852 03/06/21 1936 03/07/21 0000 03/07/21 1806 03/07/21 1808 03/08/21 0156  HGB  --    < > 6.8*  --  9.1* 8.5*  HCT  --    < > 22.2*  --  29.3* 26.6*  PLT  --    < > 102*  --  91* 80*  APTT 30  --   --  59*  --  66*  LABPROT 14.9  --   --   --   --   --   INR 1.2  --   --   --   --   --   CREATININE  --   --  1.74*  --   --   --    < > = values in this interval not displayed.    Estimated Creatinine Clearance: 38.4 mL/min (A) (by C-G formula based on SCr of 1.74 mg/dL (H)).   Medical History: Past Medical History:  Diagnosis Date   AICD (automatic cardioverter/defibrillator) present    Arthritis    Atrial fibrillation ([place])    Cardiomyopathy, dilated (HCC)    CHF (congestive heart failure) ([place])    Diabetes mellitus without complication ([place])    Endocarditis    Hypertension    Peripheral vascular disease ([place])    PVC's (premature ventricular contractions)    SVT (supraventricular tachycardia)  long RP     Medications:  Infusions:   sodium chloride Stopped (03/07/21 0621)   heparin 1,000 Units/hr (03/07/21 1258)   magnesium sulfate bolus IVPB      Assessment: Patient with atrial fibrillation/flutter, bio mitral valve 2014 warfarin PTA> heparin gtt for surgery. Was on low dose heparin while being transfused 6/18. Moved to therapeutic dosing by MD and monitored with APTTs. Now pharmacy consulted for heparin management  Warfarin was on hold for procedures. MD starting warfarin  tonight at '5mg'$  daily. PTA warf dose '5mg'$  qTTSS and 2.5 mg qMWF,  warf held PTA and was on enox '80mg'$  q12h  PTA until last dose 6/16 at 13:00 INR goal is 2-3.0  APTT this morning was therapeutic at 66. Hgb stable post transfusion 8-9, PLT and 80-90 and no overt or concern for bleeding per MD    Goal of Therapy:  INR 2-3 Heparin level 0.3-0.7 units/ml Monitor platelets by anticoagulation protocol: Yes   Plan:  Continue heparin infusion at 1000 units/hr Check anti-Xa level daily while on heparin Continue to monitor H&H and platelets   Norina Buzzard, PharmD PGY1 Pharmacy Resident 03/08/2021 10:57 AM

## 2021-03-08 NOTE — Progress Notes (Addendum)
VASCULAR SURGERY ASSESSMENT & PLAN:   2 Days Post-Op PAD; atherosclerosis of native arteries of right lower extremity causing ulceration. S/p right pop-to peroneal bypass with vein. RLE well perfused. Start coumadin 5 mg tonight and increase heparin to therapeutic rate. CBC, BMP in am.  Systolic BP 0000000 with diastolic trending in [id number].  Hold home meds. May need volume but will ask cardiology to assess and provide recommendations. Get patient weight now and daily.   Acute BLA: Received 2 units PRBCs. Hgb this morning is 8.5 g/dL No evidence of ongoing blood loss.   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 On heparin infusion at 500 units/hour now will increase to therapeutic rate. Close monitoring of fluid balance. Platelets trending down from 116k to 80k. Continue to monitor.  Recent right and left heart cath (May 2022) without evidence of CAD.   History of CKD stage 3: Cr = 1.74 (baseline 1.9) Foley discontinued. Voiding spontaneously. UOP not charted, but RN states his UOP "is decreased". Strict I and Os.  SUBJECTIVE: Complaining of 5/10 RLE pain. Has not been OOB. Daughter-in-law at bedside. No chest pain or dyspnea at rest.  PHYSICAL EXAM:   Vitals:   03/07/21 1800 03/07/21 2000 03/07/21 2345 03/08/21 0401  BP: (!) 87/51 (!) 91/44 (!) 97/43 (!) 90/47  Pulse: 64 62 63 62  Resp: '17 16 18 16  '$ Temp:  98.5 F (36.9 C) 98.9 F (37.2 C) 98.8 F (37.1 C)  TempSrc:  Oral Oral Oral  SpO2: 100% 100% 100% 98%  Weight:      Height:       General appearance: Awake, alert in no apparent distress Cardiac: Heart rate and rhythm are regular Respirations: Nonlabored Incisions: Right thigh and lower leg incisions are all well approximated. Mild edema between AK and BK incisions consistent with small hematoma. Extremities: Both feet are warm with intact sensation. He is unable to dorsiflex left ankle but is moving toes. Calf is soft.  Ischemic changes noted and unchanged of right great toe. Pulse/Doppler exam: I am unable to appreciate a palpable pulse in the graft, however there is a strong biphasic Doppler signal in it and brisk right dorsalis pedis, posterior tibial and peroneal artery Doppler signals. Brisk DP and PT signals on left.  LABS:   Lab Results  Component Value Date   WBC 10.8 (H) 03/08/2021   HGB 8.5 (L) 03/08/2021   HCT 26.6 (L) 03/08/2021   MCV 80.6 03/08/2021   PLT 80 (L) 03/08/2021   Lab Results  Component Value Date   CREATININE 1.74 (H) 03/07/2021   Lab Results  Component Value Date   INR 1.2 03/06/2021   CBG (last 3)  Recent Labs    03/07/21 0610 03/07/21 1142 03/07/21 1706  GLUCAP 145* 145* 127*    PROBLEM LIST:    Active Problems:   Popliteal artery occlusion, right (HCC)   PAD (peripheral artery disease) (HCC)   CURRENT MEDS:    sodium chloride   Intravenous Once   sodium chloride   Intravenous Once   amiodarone  200 mg Oral Daily   atorvastatin  40 mg Oral q1800   docusate sodium  100 mg Oral Daily   empagliflozin  10 mg Oral Daily   gabapentin  300 mg Oral QHS   metoprolol succinate  25 mg Oral Daily   pantoprazole  40 mg Oral Daily   sacubitril-valsartan  1 tablet Oral BID   spironolactone  12.5  mg Oral Daily   tamsulosin  0.4 mg Oral Daily   Barbie Banner, [place]  Office: [phone] 03/08/2021   I agree with the above.  I have seen and evaluated the patient.  He is postoperative day #2 from a right SFA to peroneal bypass graft with vein.  He continues to have a palpable graft pulse.  Right great toe ulcer is unchanged.  He has had no further issues with bleeding.  I have asked pharmacy to increase his heparin, and we will start him back on Coumadin today.  He has been having issues with hypotension.  I spoke with Dr. Haroldine Laws who is going to assist with medication management.  The Entresto was discontinued and midodrine was started.  Labs were repeated.  The  heart failure team will check on him tomorrow.  I appreciate Dr. Clayborne Dana assistance.  Annamarie Major

## 2021-03-09 ENCOUNTER — Inpatient Hospital Stay (HOSPITAL_COMMUNITY): Payer: Medicare HMO

## 2021-03-09 ENCOUNTER — Encounter (HOSPITAL_COMMUNITY): Payer: Self-pay | Admitting: Vascular Surgery

## 2021-03-09 ENCOUNTER — Other Ambulatory Visit (HOSPITAL_COMMUNITY): Payer: Medicare HMO

## 2021-03-09 DIAGNOSIS — I5022 Chronic systolic (congestive) heart failure: Secondary | ICD-10-CM

## 2021-03-09 DIAGNOSIS — N179 Acute kidney failure, unspecified: Secondary | ICD-10-CM | POA: Diagnosis not present

## 2021-03-09 LAB — IRON AND TIBC
Iron: 28 ug/dL — ABNORMAL LOW (ref 45–182)
Saturation Ratios: 15 % — ABNORMAL LOW (ref 17.9–39.5)
TIBC: 192 ug/dL — ABNORMAL LOW (ref 250–450)
UIBC: 164 ug/dL

## 2021-03-09 LAB — APTT: aPTT: 68 seconds — ABNORMAL HIGH (ref 24–36)

## 2021-03-09 LAB — CBC
HCT: 23.8 % — ABNORMAL LOW (ref 39.0–52.0)
Hemoglobin: 7.4 g/dL — ABNORMAL LOW (ref 13.0–17.0)
MCH: 25.6 pg — ABNORMAL LOW (ref 26.0–34.0)
MCHC: 31.1 g/dL (ref 30.0–36.0)
MCV: 82.4 fL (ref 80.0–100.0)
Platelets: 83 10*3/uL — ABNORMAL LOW (ref 150–400)
RBC: 2.89 MIL/uL — ABNORMAL LOW (ref 4.22–5.81)
RDW: 17.8 % — ABNORMAL HIGH (ref 11.5–15.5)
WBC: 8.9 10*3/uL (ref 4.0–10.5)
nRBC: 0 % (ref 0.0–0.2)

## 2021-03-09 LAB — URINALYSIS, ROUTINE W REFLEX MICROSCOPIC
Bacteria, UA: NONE SEEN
Bilirubin Urine: NEGATIVE
Glucose, UA: >=500 mg/dL — AB
Ketones, ur: NEGATIVE mg/dL
Leukocytes,Ua: NEGATIVE
Nitrite: NEGATIVE
Protein, ur: NEGATIVE mg/dL
Specific Gravity, Urine: 1.011 (ref 1.005–1.030)
pH: 5 (ref 5.0–8.0)

## 2021-03-09 LAB — BASIC METABOLIC PANEL
Anion gap: 7 (ref 5–15)
BUN: 36 mg/dL — ABNORMAL HIGH (ref 8–23)
CO2: 20 mmol/L — ABNORMAL LOW (ref 22–32)
Calcium: 8.1 mg/dL — ABNORMAL LOW (ref 8.9–10.3)
Chloride: 107 mmol/L (ref 98–111)
Creatinine, Ser: 2.33 mg/dL — ABNORMAL HIGH (ref 0.61–1.24)
GFR, Estimated: 28 mL/min — ABNORMAL LOW (ref >60)
Glucose, Bld: 117 mg/dL — ABNORMAL HIGH (ref 70–99)
Potassium: 4.8 mmol/L (ref 3.5–5.1)
Sodium: 134 mmol/L — ABNORMAL LOW (ref 135–145)

## 2021-03-09 LAB — LACTATE DEHYDROGENASE: LDH: 225 U/L — ABNORMAL HIGH (ref 98–192)

## 2021-03-09 LAB — PROTIME-INR
INR: 1.4 — ABNORMAL HIGH (ref 0.8–1.2)
Prothrombin Time: 16.7 seconds — ABNORMAL HIGH (ref 11.4–15.2)

## 2021-03-09 LAB — HEPARIN LEVEL (UNFRACTIONATED)
Heparin Unfractionated: 0.28 IU/mL — ABNORMAL LOW (ref 0.30–0.70)
Heparin Unfractionated: 0.3 IU/mL (ref 0.30–0.70)

## 2021-03-09 LAB — PROTEIN / CREATININE RATIO, URINE
Creatinine, Urine: 78.14 mg/dL
Protein Creatinine Ratio: 0.24 mg/mg{Cre} — ABNORMAL HIGH (ref 0.00–0.15)
Total Protein, Urine: 19 mg/dL

## 2021-03-09 LAB — LACTIC ACID, PLASMA: Lactic Acid, Venous: 1.2 mmol/L (ref 0.5–1.9)

## 2021-03-09 LAB — VITAMIN B12: Vitamin B-12: 1128 pg/mL — ABNORMAL HIGH (ref 180–914)

## 2021-03-09 LAB — FOLATE: Folate: 14.8 ng/mL (ref >5.9)

## 2021-03-09 LAB — PREPARE RBC (CROSSMATCH)

## 2021-03-09 LAB — SODIUM, URINE, RANDOM: Sodium, Ur: 68 mmol/L

## 2021-03-09 MED ORDER — MIDODRINE HCL 5 MG PO TABS
5.0000 mg | ORAL_TABLET | Freq: Once | ORAL | Status: AC
  Administered 2021-03-09: 5 mg via ORAL
  Filled 2021-03-09: qty 1

## 2021-03-09 MED ORDER — MIDODRINE HCL 5 MG PO TABS
5.0000 mg | ORAL_TABLET | Freq: Three times a day (TID) | ORAL | Status: DC
  Filled 2021-03-09: qty 1

## 2021-03-09 MED ORDER — FUROSEMIDE 10 MG/ML IJ SOLN
80.0000 mg | Freq: Once | INTRAMUSCULAR | Status: AC
  Administered 2021-03-09: 80 mg via INTRAVENOUS
  Filled 2021-03-09: qty 8

## 2021-03-09 MED ORDER — MIDODRINE HCL 5 MG PO TABS
10.0000 mg | ORAL_TABLET | Freq: Three times a day (TID) | ORAL | Status: DC
  Administered 2021-03-09 – 2021-03-12 (×10): 10 mg via ORAL
  Filled 2021-03-09 (×9): qty 2

## 2021-03-09 MED ORDER — MIDODRINE HCL 5 MG PO TABS: 5.0000 mg | ORAL_TABLET | Freq: Once | ORAL | Status: DC

## 2021-03-09 NOTE — Progress Notes (Signed)
CSW attempted to visit the patient at bedside to introduce self as the heart failure social worker and to complete a very brief SDOH screening with the patient to address social needs as needed and discuss SNF consult however the patient was busy and CSW will check back later today.   CSW will continue to follow for discharge needs and will check back with the patient at another time.  Elisabel Hanover, MSW, [place] Heart Failure Social Worker

## 2021-03-09 NOTE — Progress Notes (Addendum)
VASCULAR SURGERY ASSESSMENT & PLAN:   3 Days Post-Op PAD; atherosclerosis of native arteries of right lower extremity causing ulceration. S/p right pop-to peroneal bypass with vein. RLE well perfused. Coumadin 5 mg daily started last night. Heparin now at therapeutic rate.    Systolic BP 0000000.  Cardiology consulted and meds adjusted. Patient weight daily. They are to see patient today.   Acute BLA: Received 2 units PRBCs. Hgb this morning is 7.4 g/dL No evidence of ongoing blood loss. OT notes reviewed>patient with orthostatic BP. I discussed with Dr. [name]. Will transfuse additional unit of PRBCs now.   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 On therapeutic heparin dose now. Platelet count up slightly to 83k.   Recent right and left heart cath (May 2022) without evidence of CAD.   History of CKD stage 3: elevated Cr = 2.33 (baseline 1.9) Foley discontinued. Voiding spontaneously. UOP 300 cc. Consult nephrology>Dr. Justin Mend notified. SUBJECTIVE:   Sitting up on side of bed with PT. Incisional pain. Currently without dizziness or SOB. Tolerating diet.  PHYSICAL EXAM:   Vitals:   03/08/21 2117 03/08/21 2251 03/09/21 0100 03/09/21 0500  BP: (!) 97/47 (!) 92/49 (!) 85/60 (!) 87/50  Pulse: 63 63 82 60  Resp: '11 15 16 20  '$ Temp: 98.6 F (37 C) 98.6 F (37 C)  98.6 F (37 C)  TempSrc: Oral Oral  Oral  SpO2: 100% 100% 96% 98%  Weight:      Height:       General appearance: Awake, alert in no apparent distress Cardiac: Heart rate and rhythm are regular Respirations: Nonlabored. Lungs CTAB Incisions: Right thigh and lower leg incisions are all well approximated. Extremities: Right foot is warm with intact sensation. He is unable to dorsiflex right ankle but is moving toes. Calf is soft. Ischemic changes noted and unchanged of right great toe. Pulse/Doppler exam: Brisk biphasic Doppler signal in graft and brisk right dorsalis pedis,  posterior tibial and peroneal artery Doppler signals.  LABS:   Lab Results  Component Value Date   WBC 8.9 03/09/2021   HGB 7.4 (L) 03/09/2021   HCT 23.8 (L) 03/09/2021   MCV 82.4 03/09/2021   PLT 83 (L) 03/09/2021   Lab Results  Component Value Date   CREATININE 2.33 (H) 03/09/2021   Lab Results  Component Value Date   INR 1.2 03/06/2021   CBG (last 3)  Recent Labs    03/07/21 0610 03/07/21 1142 03/07/21 1706  GLUCAP 145* 145* 127*    PROBLEM LIST:    Active Problems:   Popliteal artery occlusion, right (HCC)   PAD (peripheral artery disease) (HCC)   CURRENT MEDS:    sodium chloride   Intravenous Once   amiodarone  200 mg Oral Daily   atorvastatin  40 mg Oral q1800   docusate sodium  100 mg Oral Daily   gabapentin  300 mg Oral QHS   midodrine  2.5 mg Oral TID WC   pantoprazole  40 mg Oral Daily   tamsulosin  0.4 mg Oral Daily   warfarin  5 mg Oral Daily   Warfarin - Physician Dosing Inpatient   Does not apply [place], PA-C  Office: [phone] 03/09/2021   VASCULAR STAFF ADDENDUM: I have independently interviewed and examined the patient. I agree with the above.  Slow progress after pop-peroneal bypass 03/06/21 for ischemic ulceration Bypass graft with palpable pulse mid calf. Strong peroneal and PT  doppler signals. He is mobilizing slowly and will need SNF. Continue PT / OT / OOB. Still anemic. Will give another unit of blood today. Will diurese. Greatly appreciate nephrology assistance. Persistent hypotension. Heart failure team to see the patient today. Appreciate their help. Updated daughter [name] by telephone today.   [name]. [name], MD [facility] Phone Number: [phone] 03/09/2021 11:04 AM

## 2021-03-09 NOTE — Progress Notes (Signed)
ANTICOAGULATION CONSULT NOTE - Consult  Pharmacy Consult for heparin Indication: atrial fibrillation  Allergies  Allergen Reactions   Tuna [Fish Allergy] Nausea And Vomiting    Patient Measurements: Height: 6' (182.9 cm) Weight: 76.4 kg (168 lb 6.9 oz) IBW/kg (Calculated) : 77.6 Heparin Dosing Weight: 70.2  Vital Signs: Temp: 98.6 F (37 C) (06/20 0500) Temp Source: Oral (06/20 0500) BP: 87/50 (06/20 0500) Pulse Rate: 60 (06/20 0500)  Labs: Recent Labs    03/06/21 0852 03/06/21 1936 03/07/21 0000 03/07/21 1806 03/07/21 1808 03/08/21 0156 03/08/21 1147 03/09/21 0241  HGB  --    < > 6.8*  --  9.1* 8.5*  --  7.4*  HCT  --    < > 22.2*  --  29.3* 26.6*  --  23.8*  PLT  --    < > 102*  --  91* 80*  --  83*  APTT 30  --   --  59*  --  66*  --  68*  LABPROT 14.9  --   --   --   --   --   --   --   INR 1.2  --   --   --   --   --   --   --   HEPARINUNFRC  --   --   --   --   --   --   --  0.28*  CREATININE  --   --  1.74*  --   --   --  2.37* 2.33*   < > = values in this interval not displayed.     Estimated Creatinine Clearance: 28.7 mL/min (A) (by C-G formula based on SCr of 2.33 mg/dL (H)).   Medical History: Past Medical History:  Diagnosis Date   AICD (automatic cardioverter/defibrillator) present    Arthritis    Atrial fibrillation ([place])    Cardiomyopathy, dilated (HCC)    CHF (congestive heart failure) ([place])    Diabetes mellitus without complication ([place])    Endocarditis    Hypertension    Peripheral vascular disease ([place])    PVC's (premature ventricular contractions)    SVT (supraventricular tachycardia)  long RP     Medications:  Infusions:   sodium chloride Stopped (03/07/21 0621)   heparin 1,000 Units/hr (03/09/21 0725)   magnesium sulfate bolus IVPB      Assessment: Patient with atrial fibrillation/flutter, bioprosthetic MVR 2014 on warfarin. Warfarin held for procedures PTA and started on Lovenox 80 mg BID (Last dose 6/16 @ 1300) > heparin  gtt for surgery. Was on low dose heparin while being transfused 6/18. Moved to therapeutic dosing by MD and monitored with APTTs. Pharmacy consulted for heparin management.  Of note, recent right and left heart cath (May 2022) without evidence of CAD  HL was SUBtherapeutic at 0.29 this morning. Hgb trending down to 7.4, PLT 83, currently no evidence of ongoing blood loss. S/p PRBCs x 2 on 6/18.  MD dosing warfarin at '5mg'$  daily. Ordered daily INR. PTA warf dose '5mg'$  qTTSS and 2.5 mg qMWF  Goal of Therapy:  INR 2-3 Heparin level 0.3-0.7 units/ml Monitor platelets by anticoagulation protocol: Yes   Plan:  Increase heparin infusion to 1100 units/hr Check anti-Xa level in 6-8 hours and daily while on heparin Continue to monitor H&H and platelets Warfarin 5 mg PO daily per MD F/u INR   Thank you for allowing [place] to participate in this patients care. Jens Som, PharmD 03/09/2021 7:53 AM  Please  check [email or web address] for unit-specific pharmacy phone numbers.

## 2021-03-09 NOTE — TOC Initial Note (Addendum)
Transition of Care (TOC) - Initial/Assessment Note  Heart Failure   Patient Details  Name: IRMA SPOMER MRN: [id number] Date of Birth: 1945-12-07  Transition of Care [facility]) CM/SW Contact:    [place], [place] Phone Number: 03/09/2021, 12:23 PM  Clinical Narrative:    CSW received consult for possible SNF placement at time of discharge. CSW spoke with patient and the patient's daughter-in-law, [name]. Patient reported that he lives alone and is currently unable to care for himself at his home given his current physical needs and fall risk. Patient expressed understanding of PT recommendation and is agreeable to SNF placement at time of discharge. Patient reports preference for a SNF in [place], [place]. CSW discussed insurance authorization process and provided Medicare SNF ratings list. Patient has received the COVID vaccines. Patient expressed being hopeful for rehab and to feel better soon. No further questions reported at this time.          4:20pm - CSW spoke with patient and patient's daughter-in-law, [name] at bedside regarding bed offers and they choose [facility] as Mr. Dulin has been at that facility before. CSW spoke with [name] at [facility] and she reported having a bed available at [facility] and she will start the insurance authorization first thing tomorrow morning. CSW attempted to start the insurance authorization with Everlene Balls but was unable and CSW will check with the facility tomorrow regarding the authorization.    CSW will continue to follow throughout discharge.        Expected Discharge Plan: Skilled Nursing Facility Barriers to Discharge: Continued Medical Work up   Patient Goals and CMS Choice Patient states their goals for this hospitalization and ongoing recovery are:: to return home CMS Medicare.gov Compare Post Acute Care list provided to:: Patient Choice offered to / list presented to : Patient  Expected Discharge Plan and Services Expected Discharge Plan: [place] In-house Referral: Clinical Social Work Discharge Planning Services: CM Consult   Living arrangements for the past 2 months: [place]                                      Prior Living Arrangements/Services Living arrangements for the past 2 months: Single Family Home Lives with:: Self Patient language and need for interpreter reviewed:: Yes Do you feel safe going back to the place where you live?: Yes      Need for Family Participation in Patient Care: No (Comment) Care giver support system in place?: No (comment)   Criminal Activity/Legal Involvement Pertinent to Current Situation/Hospitalization: No - Comment as needed  Activities of Daily Living      Permission Sought/Granted Permission sought to share information with : Facility Sport and exercise psychologist, Case Manager, Family Supports Permission granted to share information with : Yes, Verbal Permission Granted  Share Information with NAME: [name]  Permission granted to share info w AGENCY: SNF's  Permission granted to share info w Relationship: Daughter-in-law  Permission granted to share info w Contact Information: [phone]  Emotional Assessment Appearance:: Appears stated age Attitude/Demeanor/Rapport: Engaged Affect (typically observed): Pleasant Orientation: : Oriented to Self, Oriented to Place, Oriented to  Time, Oriented to Situation   Psych Involvement: No (comment)  Admission diagnosis:  Popliteal artery occlusion, right ([place]) [I70.201] PAD (peripheral artery disease) ([place]) [I73.9] Patient Active Problem List   Diagnosis Date Noted   Popliteal artery occlusion, right ([place]) 03/06/2021   PAD (peripheral artery disease) ([place])  03/06/2021   Acute on chronic combined systolic and diastolic CHF (congestive heart failure) ([place]) 09/06/2020   CHF (congestive heart failure) ([place]) 08/26/2020   Acute combined systolic and diastolic CHF, NYHA class 3 (HCC)    Stage 3 chronic kidney  disease ([address])    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   PVC's (premature ventricular contractions) 06/12/2016   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) 09/28/2012   Status post mitral valve replacement with bioprosthetic valve 09/19/2012    Class: Acute   Acute pericarditis 09/16/2012    Class: Acute   Chronic systolic heart failure ([place]) 09/16/2012    Class: Acute   Peripheral vascular disease ([place]) 09/15/2012   Acute ischemic stroke ([place]) 09/15/2012   PCP:  [name], MD Pharmacy:   [id number] [place], [id number]: [phone]: [phone]    Social Determinants of Health (SDOH) Interventions    Readmission Risk Interventions No flowsheet data found.  Chandra Asher, MSW, [facility]ruin[facility] Failure Social Worker

## 2021-03-09 NOTE — Evaluation (Signed)
Occupational Therapy Evaluation Patient Details Name: Ryan [name] MRN: [id number] DOB: 1945-11-14 Today's Date: 03/09/2021    History of Present Illness Pt is a 75 y/o male presenting for evaluation of non healing wound. Pt underwent R above knee popliteal artery-peroneal bypass 6/17.  PMH includes: endocarditis s/p bioprosthetic mitral valve replacement in 2014, atrial fibrillation/flutter, HTN, DM2, PAD, CKD stage III, chronic systolic heart failure, embolic CVA, anticoagulated with coumadin   Clinical Impression   PTA, pt lives alone and reports Modified Independence with ADLs, IADLs and mobility using cane. Pt presents with deficits in strength, endurance, standing balance, and pain/impaired ROM in R LE. Pt overall Max A for bed mobility, Mod A x 1 for sit to stand using RW though unable to successfully sequence legs for pivot today. Pt also noted with orthostatic BP, so opted for scoot transfer at Mod A. Pt requires Min A for UB ADLs and Total A for LB ADLs (+2 physical assist if completed in standing). Pt's daughter present during session and both agreeable for ST rehab at SNF to maximize independence with ADLs/mobility.   BP lying: 92/48 BP sitting: 89/38 BP after standing attempt: 70/38 BP after scoot transfer with B LE elevated: 96/49    Follow Up Recommendations  SNF    Equipment Recommendations  3 in 1 bedside commode    Recommendations for Other Services       Precautions / Restrictions Precautions Precautions: Fall;Other (comment) Precaution Comments: watch orthostatic BP, hx of limited R LE dorsiflexion Restrictions Weight Bearing Restrictions: No      Mobility Bed Mobility Overal bed mobility: Needs Assistance Bed Mobility: Supine to Sit     Supine to sit: Max assist;HOB elevated     General bed mobility comments: Max A to scoot hips, advance R LE and lift trunk. encouraged use of bedrails    Transfers Overall transfer level: Needs  assistance Equipment used: Rolling walker (2 wheeled) Transfers: Sit to/from Stand;Lateral/Scoot Transfers Sit to Stand: From elevated surface;Mod assist        Lateral/Scoot Transfers: Mod assist;From elevated surface General transfer comment: Attempted standing with Mod A overall, increased time and cues to tuck bottom in to achieve upright posture. Pt unable to sequence LEs for pivot so guided in scoot transfer bed to recliner with overall Mod A    Balance Overall balance assessment: Needs assistance Sitting-balance support: Feet supported;Single extremity supported;Bilateral upper extremity supported Sitting balance-Leahy Scale: Poor Sitting balance - Comments: Pt reliant on UE support   Standing balance support: Bilateral upper extremity supported;During functional activity Standing balance-Leahy Scale: Poor Standing balance comment: pt reliant on bil UE support from RW and external support                           ADL either performed or assessed with clinical judgement   ADL Overall ADL's : Needs assistance/impaired Eating/Feeding: Set up;Sitting Eating/Feeding Details (indicate cue type and reason): assist to open containers Grooming: Set up;Sitting   Upper Body Bathing: Minimal assistance;Sitting   Lower Body Bathing: Total assistance;Sit to/from stand;+2 for physical assistance;+2 for safety/equipment   Upper Body Dressing : Minimal assistance;Sitting Upper Body Dressing Details (indicate cue type and reason): Min A to don gown around back sitting EOB Lower Body Dressing: Total assistance;+2 for physical assistance;+2 for safety/equipment;Sit to/from stand Lower Body Dressing Details (indicate cue type and reason): Total A to don socks     Toileting- Clothing Manipulation and Hygiene: Total assistance;+2 for  safety/equipment;+2 for physical assistance;Sit to/from stand         General ADL Comments: Pt with limitations in overall strength, as well as  pain and limited ROM in R LE     Vision Baseline Vision/History: Wears glasses Wears Glasses: Reading only Patient Visual Report: No change from baseline Vision Assessment?: No apparent visual deficits     Perception     Praxis      Pertinent Vitals/Pain Pain Assessment: Faces Faces Pain Scale: Hurts even more Pain Location: R LE Pain Descriptors / Indicators: Discomfort;Grimacing;Guarding Pain Intervention(s): Monitored during session;Repositioned;Limited activity within patient's tolerance     Hand Dominance Right   Extremity/Trunk Assessment Upper Extremity Assessment Upper Extremity Assessment: Generalized weakness   Lower Extremity Assessment Lower Extremity Assessment: RLE deficits/detail RLE Deficits / Details: hx of difficulty with dorsiflexion   Cervical / Trunk Assessment Cervical / Trunk Assessment: Kyphotic   Communication Communication Communication: No difficulties   Cognition Arousal/Alertness: Awake/alert Behavior During Therapy: WFL for tasks assessed/performed Overall Cognitive Status: Within Functional Limits for tasks assessed                                     General Comments  Daughter at bedside and engaged throughout session    Exercises     Shoulder Instructions      Home Living Family/patient expects to be discharged to:: Private residence Living Arrangements: Alone Available Help at Discharge: Family;Available PRN/intermittently Type of Home: House Home Access: Stairs to enter Entrance Stairs-Number of Steps: 1 Entrance Stairs-Rails: None Home Layout: One level     Bathroom Shower/Tub: Occupational psychologist: Handicapped height (w/ toilet riser)     Home Equipment: Walker - 4 wheels;Cane - single point;Toilet riser;Shower seat          Prior Functioning/Environment Level of Independence: Independent with assistive device(s)        Comments: pt uses cane for household mobility and rollator  for mobility in the community. plan was to move to daughter's house in [place].        OT Problem List: Decreased strength;Decreased range of motion;Decreased activity tolerance;Impaired balance (sitting and/or standing);Decreased knowledge of use of DME or AE;Pain      OT Treatment/Interventions: Self-care/ADL training;Therapeutic exercise;Energy conservation;DME and/or AE instruction;Therapeutic activities;Patient/family education;Balance training    OT Goals(Current goals can be found in the care plan section) Acute Rehab OT Goals Patient Stated Goal: get stronger and go home OT Goal Formulation: With patient Time For Goal Achievement: 03/23/21 Potential to Achieve Goals: Good ADL Goals Pt Will Perform Lower Body Bathing: sitting/lateral leans;sit to/from stand;with mod assist Pt Will Perform Lower Body Dressing: with mod assist;sitting/lateral leans;sit to/from stand Pt Will Transfer to Toilet: with mod assist;stand pivot transfer;bedside commode Pt Will Perform Toileting - Clothing Manipulation and hygiene: with mod assist;sitting/lateral leans;sit to/from stand  OT Frequency: Min 2X/week   Barriers to D/C:            Co-evaluation              AM-PAC OT "6 Clicks" Daily Activity     Outcome Measure Help from another person eating meals?: A Little Help from another person taking care of personal grooming?: A Little Help from another person toileting, which includes using toliet, bedpan, or urinal?: Total Help from another person bathing (including washing, rinsing, drying)?: A Lot Help from another person to put on and taking  off regular upper body clothing?: A Little Help from another person to put on and taking off regular lower body clothing?: Total 6 Click Score: 13   End of Session Equipment Utilized During Treatment: Gait belt;Rolling walker Nurse Communication: Mobility status;Other (comment) (BP)  Activity Tolerance: Patient tolerated treatment  well Patient left: in chair;with call bell/phone within reach;with chair alarm set  OT Visit Diagnosis: Unsteadiness on feet (R26.81);Other abnormalities of gait and mobility (R26.89);Muscle weakness (generalized) (M62.81);Pain Pain - Right/Left: Right Pain - part of body: Leg                Time: [id number] OT Time Calculation (min): 40 min Charges:  OT General Charges $OT Visit: 1 Visit OT Evaluation $OT Eval Moderate Complexity: 1 Mod OT Treatments $Self Care/Home Management : 8-22 mins $Therapeutic Activity: 8-22 mins  Malachy Chamber, OTR/L Acute Rehab Services Office: [phone]   Layla Maw 03/09/2021, 8:49 AM

## 2021-03-09 NOTE — Consult Note (Signed)
Referring Provider: No ref. provider found Primary Care Physician:  [name], MD Primary Nephrologist:   Reason for Consultation:  Acute Kidney Injury, maintenance of euvolemia, treatment and assessment of acid/base and electrolyte abnormalities  HPI: This is a 75 year old man with history of mitral valve replacement 2014, ardiomyopathy, Atrial fibrillation and congestive heart disease who has severe peripheral vascular disease and underwent successful right fem/pop bypass 03/06/21.  He has known chronic kidney disease with baseline creatinine of 1.7 mg/dl   Post operative he has been oliguric despite IV fluids  and has a positive fluid balance of 5 L     His hemodynamics have been fairly soft with a BP of about [id number] systolic  BP 0000000  P 80  T 98.4   sats 100 %  Na 134  K 4.8  Cl 107  CO2  20  BUN 2.33  Hb 7.4   Plts  83    Home meds : amiodarone, Jardiance,  lasix 40 mg dialy , metoprolol, Norco, spirinolactone, flomax, coumadin, protonix  Hospital Meds :  amiodarone, lipitor, neurontin, midodrine 2.5 mg tid, protonix, flomax and coumadin, IV heparin  2 D echo   EF 45 %  02/2021  Past Medical History:  Diagnosis Date   AICD (automatic cardioverter/defibrillator) present    Arthritis    Atrial fibrillation (HCC)    Cardiomyopathy, dilated (HCC)    CHF (congestive heart failure) (HCC)    Diabetes mellitus without complication ([place])    Endocarditis    Hypertension    Peripheral vascular disease ([place])    PVC's (premature ventricular contractions)    SVT (supraventricular tachycardia)  long RP     Past Surgical History:  Procedure Laterality Date   ABDOMINAL AORTAGRAM N/A 10/03/2012   Procedure: ABDOMINAL Maxcine Ham;  Surgeon: [name], MD;  [facility];  Service: Cardiovascular;  Laterality: N/A;   ABDOMINAL AORTOGRAM W/LOWER EXTREMITY N/A 02/25/2021   Procedure: ABDOMINAL AORTOGRAM W/LOWER EXTREMITY;  Surgeon: Cherre Robins, MD;  Location: [place] CV LAB;   Service: Cardiovascular;  Laterality: N/A;   BYPASS GRAFT FEMORAL-PERONEAL Right 03/06/2021   Procedure: RIGHT ABOVE KNEE POPLITEAL ARTERY-PERONEAL BYPASS;  Surgeon: Cherre Robins, MD;  Location: [place];  Service: Vascular;  Laterality: Right;   CORONARY ANGIOGRAM  09/21/2012   Procedure: CORONARY ANGIOGRAM;  Surgeon: [name], MD;  Location: [facility] CATH LAB;  Service: Cardiovascular;;   EP IMPLANTABLE DEVICE N/A 06/11/2016   Procedure: ICD Implant;  Surgeon: Will Meredith Leeds, MD;  Location: [place] CV LAB;  Service: Cardiovascular;  Laterality: N/A;   EXTREMITY WIRE/PIN REMOVAL  09/14/2012   Procedure: REMOVAL K-WIRE/PIN EXTREMITY;  Surgeon: [name], MD;  Location: [place];  Service: Orthopedics;  Laterality: Right;  Right Foot   I & D EXTREMITY  09/14/2012   Procedure: IRRIGATION AND DEBRIDEMENT EXTREMITY;  Surgeon: Tennis Must, MD;  Location: [place];  Service: Orthopedics;  Laterality: Right;   INTRAOPERATIVE TRANSESOPHAGEAL ECHOCARDIOGRAM  09/26/2012   Procedure: INTRAOPERATIVE TRANSESOPHAGEAL ECHOCARDIOGRAM;  Surgeon: [name], MD;  Location: [facility] OR;  Service: Open Heart Surgery;  Laterality: N/A;   MITRAL VALVE REPLACEMENT  09/26/2012   Procedure: MITRAL VALVE (MV) REPLACEMENT;  Surgeon: [name], MD;  Location: [place] OR;  Service: Open Heart Surgery;  Laterality: N/A;   RIGHT HEART CATH N/A 08/29/2020   Procedure: RIGHT HEART CATH;  Surgeon: Larey Dresser, MD;  Location: [place] CV LAB;  Service: Cardiovascular;  Laterality: N/A;   RIGHT HEART CATHETERIZATION  09/21/2012   Procedure: RIGHT HEART CATH;  Surgeon: [name], MD;  Location[facility] CATH LAB;  Service: Cardiovascular;;   RIGHT/LEFT HEART CATH AND CORONARY ANGIOGRAPHY N/A 01/30/2021   Procedure: RIGHT/LEFT HEART CATH AND CORONARY ANGIOGRAPHY;  Surgeon: Larey Dresser, MD;  Location: [place] CV LAB;  Service: Cardiovascular;  Laterality: N/A;   SVT ABLATION N/A 09/03/2020   Procedure: SVT  ABLATION;  Surgeon: Constance Haw, MD;  Location: [place] CV LAB;  Service: Cardiovascular;  Laterality: N/A;   TEE WITHOUT CARDIOVERSION  09/18/2012   Procedure: TRANSESOPHAGEAL ECHOCARDIOGRAM (TEE);  Surgeon: [name], MD;  Location: [facility] ENDOSCOPY;  Service: Cardiovascular;  Laterality: N/A;    Prior to Admission medications   Medication Sig Start Date End Date Taking? Authorizing Provider  acetaminophen (TYLENOL) 325 MG tablet Take 650 mg by mouth every 6 (six) hours as needed for mild pain or headache.   Yes [provider]  amiodarone (PACERONE) 200 MG tablet TAKE 1 TABLET BY MOUTH EVERY DAY 03/03/21  Yes Bhagat, Bhavinkumar, PA  atorvastatin (LIPITOR) 40 MG tablet Take 1 tablet (40 mg total) by mouth daily at 6 PM. 10/06/12  Yes Timmothy Euler, MD  empagliflozin (JARDIANCE) 10 MG TABS tablet Take 1 tablet (10 mg total) by mouth daily. 11/11/20  Yes Larey Dresser, MD  enoxaparin (LOVENOX) 80 MG/0.8ML injection Inject '80mg'$  into the belly every 12 hours. Last dose prior to procedure is 6/16 @ 9AM. 03/02/21  Yes Larey Dresser, MD  furosemide (LASIX) 40 MG tablet Take 40 mg by mouth daily as needed for edema. 12/03/20  Yes [provider]  gabapentin (NEURONTIN) 300 MG capsule Take 300 mg by mouth at bedtime.   Yes [provider]  HYDROcodone-acetaminophen (NORCO/VICODIN) 5-325 MG per tablet Take 1 tablet by mouth 2 (two) times daily as needed for moderate pain.    Yes [provider]  metoprolol succinate (TOPROL-XL) 25 MG 24 hr tablet Take 1 tablet (25 mg total) by mouth daily. 03/04/21  Yes Larey Dresser, MD  Multiple Vitamins-Minerals (CENTRUM SILVER PO) Take 1 tablet by mouth 3 (three) times a week.   Yes [provider]  mupirocin ointment (BACTROBAN) 2 % Apply 1 application topically 2 (two) times daily.   Yes [provider]  pantoprazole (PROTONIX) 40 MG tablet Take 40 mg by mouth daily.  11/12/14  Yes [provider]  sacubitril-valsartan (ENTRESTO) 24-26 MG Take 1 tablet by mouth 2 (two) times daily. 01/22/21  Yes Larey Dresser, MD  spironolactone (ALDACTONE) 25 MG tablet Take 0.5 tablets (12.5 mg total) by mouth daily. 11/25/20  Yes Larey Dresser, MD  tamsulosin (FLOMAX) 0.4 MG CAPS capsule Take 0.4 mg by mouth daily.  07/17/18  Yes [provider]  warfarin (COUMADIN) 2.5 MG tablet Take 2.'5mg'$  ( 1 tablet ) Monday, wednesday and Friday  Other days take '5mg'$  (2 tablets) 09/06/20  Yes Bhagat, Bhavinkumar, PA  triamcinolone cream (KENALOG) 0.5 % Apply 1 application topically daily as needed (skin rash).  02/15/14   [provider]    Current Facility-Administered Medications  Medication Dose Route Frequency Provider Last Rate Last Admin   0.9 %  sodium chloride infusion (Manually program via Guardrails IV Fluids)   Intravenous Once Barbie Banner, PA-C       0.9 %  sodium chloride infusion (Manually program via Guardrails IV Fluids)   Intravenous Once Harold Barban  W, MD       0.9 %  sodium chloride infusion   Intravenous Continuous Barbie Banner, PA-C   Stopped at 03/07/21 [id number]   acetaminophen (TYLENOL) tablet 325-650 mg  325-650 mg Oral Q4H PRN Barbie Banner, PA-C       Or   acetaminophen (TYLENOL) suppository 325-650 mg  325-650 mg Rectal Q4H PRN Barbie Banner, PA-C       alum & mag hydroxide-simeth (MAALOX/MYLANTA) 200-200-20 MG/5ML suspension 15-30 mL  15-30 mL Oral Q2H PRN Setzer, [address], PA-C       amiodarone (PACERONE) tablet 200 mg  200 mg Oral Daily Barbie Banner, PA-C   200 mg at 03/09/21 [id number]   atorvastatin (LIPITOR) tablet 40 mg  40 mg Oral [id number] Barbie Banner, PA-C   40 mg at 03/08/21 1702   bisacodyl (DULCOLAX) EC tablet 5 mg  5 mg Oral Daily PRN Barbie Banner, PA-C       docusate sodium (COLACE) capsule 100 mg  100 mg Oral Daily Barbie Banner, PA-C   100 mg at 03/09/21 [id number]   gabapentin (NEURONTIN) capsule 300 mg  300 mg Oral QHS  Barbie Banner, PA-C   300 mg at 03/08/21 2147   guaiFENesin-dextromethorphan (ROBITUSSIN DM) 100-10 MG/5ML syrup 15 mL  15 mL Oral Q4H PRN Barbie Banner, PA-C       heparin ADULT infusion 100 units/mL (25000 units/278m)  1,100 Units/hr Intravenous Continuous HCherre Robins MD 11 mL/hr at 03/09/21 0829 1,100 Units/hr at 03/09/21 0829   hydrALAZINE (APRESOLINE) injection 5 mg  5 mg Intravenous Q20 Min PRN SBarbie Banner PA-C       labetalol (NORMODYNE) injection 10 mg  10 mg Intravenous Q10 min PRN SBarbie Banner PA-C       magnesium sulfate IVPB 2 g 50 mL  2 g Intravenous Daily PRN SBarbie Banner PA-C       metoprolol tartrate (LOPRESSOR) injection 2-5 mg  2-5 mg Intravenous Q2H PRN Setzer, SEdman Circle PA-C       midodrine (PROAMATINE) tablet 2.5 mg  2.5 mg Oral TID WC Bensimhon, DShaune Pascal MD   2.5 mg at 03/09/21 [id number]  morphine 2 MG/ML injection 2 mg  2 mg Intravenous Q4H PRN SBarbie Banner PA-C       ondansetron ([facility] injection 4 mg  4 mg Intravenous Q6H PRN SBarbie Banner PA-C       oxyCODONE-acetaminophen (PERCOCET/ROXICET) 5-325 MG per tablet 1-2 tablet  1-2 tablet Oral Q4H PRN SBarbie Banner PA-C   2 tablet at 03/09/21 0956   pantoprazole (PROTONIX) EC tablet 40 mg  40 mg Oral Daily SBarbie Banner PA-C   40 mg at 03/09/21 [id number]  phenol (CHLORASEPTIC) mouth spray 1 spray  1 spray Mouth/Throat PRN SBarbie Banner PA-C       polyethylene glycol (MIRALAX / GLYCOLAX) packet 17 g  17 g Oral Daily PRN Setzer, SEdman Circle PA-C       potassium chloride SA (KLOR-CON) CR tablet 20-40 mEq  20-40 mEq Oral Daily PRN Setzer, SEdman Circle PA-C       tamsulosin (FLOMAX) capsule 0.4 mg  0.4 mg Oral Daily SBarbie Banner PA-C   0.4 mg at 03/09/21 [id number]  warfarin (COUMADIN) tablet 5 mg  5 mg Oral Daily SBarbie Banner PA-C   5 mg at 03/09/21 [id number]  Warfarin - Physician Dosing Inpatient   Does not  apply q1600 Cala Bradford, [facility]        Allergies as of 02/25/2021 - Review Complete  02/25/2021  Allergen Reaction Noted   [name] allergy] Nausea And Vomiting 08/29/2015    Family History  Problem Relation Age of Onset   Hypertension Mother    Hypertension Father     Social History   Socioeconomic History   Marital status: Divorced    Spouse name: Not on file   Number of children: 3   Years of education: Not on file   Highest education level: Not on file  Occupational History   Not on file  Tobacco Use   Smoking status: Never   Smokeless tobacco: Never  Vaping Use   Vaping Use: Never used  Substance and Sexual Activity   Alcohol use: No    Alcohol/week: 0.0 standard drinks   Drug use: No   Sexual activity: Not on file  Other Topics Concern   Not on file  Social History Narrative   Daughter lives with him.  Retired Advertising account planner   Social Determinants of Radio broadcast assistant Strain: Not on [employer] Insecurity: No Food Insecurity   Worried About Charity fundraiser in the Last Year: Never true   Arboriculturist in the Last Year: Never true  Transportation Needs: No Data processing manager (Medical): No   Lack of Transportation (Non-Medical): No  Physical Activity: Not on file  Stress: Not on file  Social Connections: Not on file  Intimate Partner Violence: Not on file    Review of Systems: Gen: Denies any fever, chills, sweats,   HEENT: No visual complaints, No history of Retinopathy. Normal external appearance No Epistaxis or Sore throat. No sinusitis.   CV:  extensive cardiac history with mechanical valve replacememt Resp: Denies dyspnea at rest, dyspnea with exercise, cough, sputum, wheezing, coughing up blood, and pleurisy. GI: Denies vomiting blood, jaundice, and fecal incontinence.   Denies dysphagia or odynophagia. GU :  History of CKD and BPH MS: Denies joint pain, limitation of movement, and swelling, stiffness, low back pain, extremity pain. Denies muscle weakness, cramps, atrophy.  No use of non  steroidal antiinflammatory drugs. Derm: Denies rash, itching, dry skin, hives, moles, warts, or unhealing ulcers.  Psych: Denies depression, anxiety, memory loss, suicidal ideation, hallucinations, paranoia, and confusion. Heme: Denies bruising, bleeding, and enlarged lymph nodes. Neuro: No headache.  No diplopia. No dysarthria.  No dysphasia.  No history of CVA.  No Seizures. No paresthesias.  No weakness. Endocrine  Diabetes type 2   No Adrenal disease.  Physical Exam: Vital signs in last 24 hours: Temp:  [97.7 F (36.5 C)-98.6 F (37 C)] 98.4 F (36.9 C) (06/20 0757) Pulse Rate:  [60-82] 81 (06/20 0757) Resp:  [11-20] 16 (06/20 0757) BP: (85-98)/(47-60) 92/48 (06/20 0757) SpO2:  [96 %-100 %] 100 % (06/20 0757) Last BM Date: 03/03/21 General:   Alert,  Well-developed, well-nourished, pleasant and cooperative in NAD Head:  Normocephalic and atraumatic. Eyes:  Sclera clear, no icterus.   Conjunctiva pink. Ears:  Normal auditory acuity. Nose:  No deformity, discharge,  or lesions. Mouth:  No deformity or lesions, dentition normal. Neck:  Supple; no masses or thyromegaly. JVP not elevated Lungs:  Clear throughout to auscultation.   No wheezes, crackles, or rhonchi. No acute distress. Heart:  Regular rate and rhythm; no murmurs, clicks, rubs,  or gallops. Abdomen:  Soft, nontender and nondistended. No masses, hepatosplenomegaly  or hernias noted. Normal bowel sounds, without guarding, and without rebound.   Msk:  Symmetrical without gross deformities. Normal posture. Pulses:  No carotid, renal, femoral bruits. DP and PT symmetrical and equal Extremities:  Without clubbing or edema.  .  Intake/Output from previous day: 06/19 0701 - 06/20 0700 In: -  Out: 300 [Urine:300] Intake/Output this shift: No intake/output data recorded.  Lab Results: Recent Labs    03/07/21 1808 03/08/21 0156 03/09/21 0241  WBC 10.7* 10.8* 8.9  HGB 9.1* 8.5* 7.4*  HCT 29.3* 26.6* 23.8*  PLT 91* 80*  83*   BMET Recent Labs    03/07/21 0000 03/08/21 1147 03/09/21 0241  NA 140 136 134*  K 4.7 4.8 4.8  CL 113* 109 107  CO2 21* 19* 20*  GLUCOSE 145* 135* 117*  BUN 29* 36* 36*  CREATININE 1.74* 2.37* 2.33*  CALCIUM 7.9* 8.0* 8.1*   LFT No results for input(s): PROT, ALBUMIN, AST, ALT, ALKPHOS, BILITOT, BILIDIR, IBILI in the last 72 hours. PT/INR No results for input(s): LABPROT, INR in the last 72 hours. Hepatitis Panel No results for input(s): HEPBSAG, HCVAB, HEPAIGM, HEPBIGM in the last 72 hours.  Studies/Results: No results found.  Assessment/Plan: Acute Kidney injury  oliguric also some low blood pressures  strongly suspect that this is secondary to post op ATN. Will check renal ultrasound and urinalysis. Will need to avoid nephrotoxins and IV contrast. Monitor I's and o's closely. Daily renal panel. No nephrotoxins on board at this time  HTN/vol  positive fluid balance  will give a lasix challenge '80mg'$  IV x 1 doses   Anemia  check iron studies   possible hemolytic across mechanical valve  will check B12 folate Afib. MVR  Systolic heart failure  agree with cardoiology consult Dm   jardiance on hold  - agree    LOS: 3 Sherril Croon '@TODAY''@10'$ :36 AM

## 2021-03-09 NOTE — Consult Note (Addendum)
Advanced Heart Failure Team Consult Note   Primary Physician: [name], MD PCP-Cardiologist:  [name], MD [facility]: Dr. Aundra Dubin   Reason for Consultation: Medication Management, Post-Operative Hypotension s/p R SFA => peroneal bypass for PAD   HPI:    Ryan [name] is seen today for evaluation of Medication management/ post operative hypotension post vascular surgery for PAD, at the request of Dr. Luan Pulling, VVS.   Ryan [name] is a 75 y.o. 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   He has a history of endocarditis that resulted in mitral valve replacement in 2014. Heart cath prior to that surgery showed widely patent coronaries and moderate to severe pulmonary hypertension.   He was admitted in 12/21 with incessant SVT and CHF.  Echo showed EF 25-30% with severe RV dysfunction.  He initially had poor response to IV diuretics w/ rising SCr. RHC showed evidence for severe biventricular failure with PCWP and RA pressure > 30 and PAPi 0.8. Equalization of diastolic pressures concerning for restrictive physiology.  Cardiac output preserved (CI 2.28 Fick, 2.33 thermo). Was transferred to CCU for lasix gtt and later required addition of milrinone to aid in diuresis. 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  Also had frequent PVCs treated w/ amiodarone. Of note, LHC was not pursued, at the time, given abnormal renal function. He was diuresed and transitioned to po diuretics. SCr down to 1.74 day of d/c (down from peak of 2.4).   Patient eventually had RHC/LHC in 5/22.  This showed no significant CAD, normal filling pressures, and preserved cardiac output.  He had peripheral angiography showing severe right SFA disease w/  recommendations to undergo Rt SFA=> peroneal bypass.   Had recent echo done in clinic 03/04/21 showing interval improvement in LVEF, up to  40-45%, diffuse hypokinesis, moderately decreased RV systolic function, IVC normal, bioprosthetic mitral valve normal.   Presented 03/06/21 for planned vascular surgery. Underwent right pop-to peroneal bypass. Post op course c/b hypotension and ABLA, hgb dropped from 8.3>>6.8. Required 2U RBC transfusion. Hgb improved to 9.1, but back down again today 8.5>>7.4. Plts also down 102>>80K. Has been in heparin gtt. SBPs 80s-90s. HF/BP active meds on hold, except for tamsulosin for BPH. Now on midodrine 2.5 tid. Also w/ AKI. Baseline SCr ~1.6-1.8. Gradual increase in SCr, 1.8>>1.9>>2.4. No improvement despite IVFs. Net + 2.8L. Oliguric, only 300 cc in UOP yesterday. Nephrology has been consulted. He is ordered to get another unit of blood. CO2 also down at 20. LEs warm.   He reports his appetite is not great. Feels dizzy and lightheaded w/ standing. Denies dyspnea. Has chronic LBP, but at baseline. Denies flank pain.     Echo 03/04/21 1. Left ventricular ejection fraction, by estimation, is 40 to 45%. The left ventricle has mildly decreased function. The left ventricle demonstrates global hypokinesis. Left ventricular diastolic parameters are indeterminate. 2. Right ventricular systolic function is moderately reduced. The right ventricular size is mildly enlarged. There is normal pulmonary artery systolic pressure. The estimated right ventricular systolic pressure is 12.4 mmHg. 3. Left atrial size was severely dilated. 4. Right atrial size was mildly dilated. 5. Bioprosthetic mitral valve. Mean gradient 6 mmHg, no significant regurgitation noted. 6. The aortic valve is tricuspid. Aortic valve regurgitation  is not visualized. Mild aortic valve sclerosis is present, with no evidence of aortic valve stenosis. 7. Aortic dilatation noted. There is mild dilatation of the  aortic root, measuring 42 mm. 8. The inferior vena cava is normal in size with greater than 50% respiratory variability, suggesting right atrial pressure of 3 mmHg.  Compared to prior study, EF improved (previously 25-30%).   R/LHC 01/30/21 1. Low filling pressures. 2. Preserved cardiac output. 3. No significant CAD.   Nonischemic cardiomyopathy (surprising given significant peripheral vascular disease).      Review of Systems: [y] = yes, _0  = no   General: Weight gain _1 ; Weight loss _2 ; Anorexia [Y ]; Fatigue [ Y]; Fever _3 ; Chills _4 ; Weakness _5   Cardiac: Chest pain/pressure _6 ; Resting SOB _7 ; Exertional SOB _8 ; Orthopnea _9 ; Pedal Edema _10 ; Palpitations _11 ; Syncope _12 ; Presyncope [ Y]; Paroxysmal nocturnal dyspnea_13   Pulmonary: Cough _14 ; Wheezing_15 ; Hemoptysis_16 ; Sputum _17 ; Snoring _18   GI: Vomiting_19 ; Dysphagia_20 ; Melena_21 ; Hematochezia _22 ; Heartburn_23 ; Abdominal pain _24 ; Constipation _25 ; Diarrhea _26 ; BRBPR _27   GU: Hematuria_28 ; Dysuria _29 ; Nocturia_30   Vascular: Pain in legs with walking [ Y]; Pain in feet with lying flat _31 ; Non-healing sores [Y]; Stroke _32 ; TIA _33 ; Slurred speech _34 ;  Neuro: Headaches_35 ; Vertigo_36 ; Seizures_37 ; Paresthesias_38 ;Blurred vision _39 ; Diplopia _40 ; Vision changes _41   Ortho/Skin: Arthritis _42 ; Joint pain _43 ; Muscle pain _44 ; Joint swelling _45 ; Back Pain _46 ; Rash _47   Psych: Depression_48 ; Anxiety_49   Heme: Bleeding problems [ Y]; Clotting disorders _50 ; Anemia [ Y]  Endocrine: Diabetes _51 ; Thyroid dysfunction_52   Home Medications Prior to Admission medications   Medication Sig Start Date End Date Taking? Authorizing Provider  acetaminophen (TYLENOL) 325 MG tablet Take 650 mg by mouth every 6 (six) hours as needed for mild pain or headache.   Yes [provider]  amiodarone (PACERONE) 200 MG tablet TAKE 1 TABLET BY MOUTH EVERY DAY 03/03/21  Yes Bhagat, Bhavinkumar, PA  atorvastatin (LIPITOR) 40 MG  tablet Take 1 tablet (40 mg total) by mouth daily at 6 PM. 10/06/12  Yes Timmothy Euler, MD  empagliflozin (JARDIANCE) 10 MG TABS tablet Take 1 tablet (10 mg total) by mouth daily. 11/11/20  Yes Larey Dresser, MD  enoxaparin (LOVENOX) 80 MG/0.8ML injection Inject 66m into the belly every 12 hours. Last dose prior to procedure is 6/16 @ 9AM. 03/02/21  Yes MLarey Dresser MD  furosemide (LASIX) 40 MG tablet Take 40 mg by mouth daily as needed for edema. 12/03/20  Yes [provider]  gabapentin (NEURONTIN) 300 MG capsule Take 300 mg by mouth at bedtime.   Yes [provider]  HYDROcodone-acetaminophen (NORCO/VICODIN) 5-325 MG per tablet Take 1 tablet by mouth 2 (two) times daily as needed for moderate pain.    Yes [provider]  metoprolol succinate (TOPROL-XL) 25 MG 24 hr tablet Take 1 tablet (25 mg total) by mouth daily. 03/04/21  Yes MLarey Dresser MD  Multiple Vitamins-Minerals (CENTRUM SILVER PO) Take 1 tablet by mouth 3 (three) times a week.   Yes [provider]  mupirocin ointment (BACTROBAN) 2 % Apply 1 application topically 2 (two) times daily.   Yes [provider]  pantoprazole (PROTONIX) 40 MG  tablet Take 40 mg by mouth daily.  11/12/14  Yes [provider]  sacubitril-valsartan (ENTRESTO) 24-26 MG Take 1 tablet by mouth 2 (two) times daily. 01/22/21  Yes Larey Dresser, MD  spironolactone (ALDACTONE) 25 MG tablet Take 0.5 tablets (12.5 mg total) by mouth daily. 11/25/20  Yes Larey Dresser, MD  tamsulosin (FLOMAX) 0.4 MG CAPS capsule Take 0.4 mg by mouth daily.  07/17/18  Yes [provider]  warfarin (COUMADIN) 2.5 MG tablet Take 2.36m ( 1 tablet ) Monday, wednesday and Friday  Other days take 533m(2 tablets) 09/06/20  Yes Bhagat, Bhavinkumar, PA  triamcinolone cream (KENALOG) 0.5 % Apply 1 application topically daily as needed (skin rash).  02/15/14   [provider]    Past Medical History: Past Medical  History:  Diagnosis Date   AICD (automatic cardioverter/defibrillator) present    Arthritis    Atrial fibrillation (H[facility]   Cardiomyopathy, dilated (HCC)    CHF (congestive heart failure) ([facility]   Diabetes mellitus without complication ([facility]   Endocarditis    Hypertension    Peripheral vascular disease (HCC)    PVC's (premature ventricular contractions)    SVT (supraventricular tachycardia)  long RP     Past Surgical History: Past Surgical History:  Procedure Laterality Date   ABDOMINAL AORTAGRAM N/A 10/03/2012   Procedure: ABDOMINAL AOMaxcine Ham Surgeon: VaSerafina MitchellMD;  [facility];  Service: Cardiovascular;  Laterality: N/A;   ABDOMINAL AORTOGRAM W/LOWER EXTREMITY N/A 02/25/2021   Procedure: ABDOMINAL AORTOGRAM W/LOWER EXTREMITY;  Surgeon: HaCherre RobinsMD;  [facility];  Service: Cardiovascular;  Laterality: N/A;   BYPASS GRAFT FEMORAL-PERONEAL Right 03/06/2021   Procedure: RIGHT ABOVE KNEE POPLITEAL ARTERY-PERONEAL BYPASS;  Surgeon: HaCherre RobinsMD;  Location: [facility] Service: Vascular;  Laterality: Right;   CORONARY ANGIOGRAM  09/21/2012   Procedure: CORONARY ANGIOGRAM;  Surgeon: HeSinclair GroomsMD;  [facility];  Service: Cardiovascular;;   EP IMPLANTABLE DEVICE N/A 06/11/2016   Procedure: ICD Implant;  Surgeon: Will MaMeredith LeedsMD;  [facility];  Service: Cardiovascular;  Laterality: N/A;   EXTREMITY WIRE/PIN REMOVAL  09/14/2012   Procedure: REMOVAL K-WIRE/PIN EXTREMITY;  Surgeon: JoAlta CorningMD;  Location: [facility] Service: Orthopedics;  Laterality: Right;  Right Foot   I & D EXTREMITY  09/14/2012   Procedure: IRRIGATION AND DEBRIDEMENT EXTREMITY;  Surgeon: KeTennis MustMD;  Location: [facility] Service: Orthopedics;  Laterality: Right;   INTRAOPERATIVE TRANSESOPHAGEAL ECHOCARDIOGRAM  09/26/2012   Procedure: INTRAOPERATIVE TRANSESOPHAGEAL ECHOCARDIOGRAM;  Surgeon: BrGaye PollackMD;  [facility];   Service: Open Heart Surgery;  Laterality: N/A;   MITRAL VALVE REPLACEMENT  09/26/2012   Procedure: MITRAL VALVE (MV) REPLACEMENT;  Surgeon: BrGaye PollackMD;  [facility];  Service: Open Heart Surgery;  Laterality: N/A;   RIGHT HEART CATH N/A 08/29/2020   Procedure: RIGHT HEART CATH;  Surgeon: McLarey DresserMD;  [facility];  Service: Cardiovascular;  Laterality: N/A;   RIGHT HEART CATHETERIZATION  09/21/2012   Procedure: RIGHT HEART CATH;  Surgeon: HeSinclair GroomsMD;  [facility];  Service: Cardiovascular;;   RIGHT/LEFT HEART CATH AND CORONARY ANGIOGRAPHY N/A 01/30/2021   Procedure: RIGHT/LEFT HEART CATH AND CORONARY ANGIOGRAPHY;  Surgeon: McLarey DresserMD;  [place];  Service: Cardiovascular;  Laterality: N/A;   SVT ABLATION  N/A 09/03/2020   Procedure: SVT ABLATION;  Surgeon: Constance Haw, MD;  Location: [place] CV LAB;  Service: Cardiovascular;  Laterality: N/A;   TEE WITHOUT CARDIOVERSION  09/18/2012   Procedure: TRANSESOPHAGEAL ECHOCARDIOGRAM (TEE);  Surgeon: [name], MD;  Location[facility] ENDOSCOPY;  Service: Cardiovascular;  Laterality: N/A;    Family History: Family History  Problem Relation Age of Onset   Hypertension Mother    Hypertension Father     Social History: Social History   Socioeconomic History   Marital status: Divorced    Spouse name: Not on file   Number of children: 3   Years of education: Not on file   Highest education level: Not on file  Occupational History   Not on file  Tobacco Use   Smoking status: Never   Smokeless tobacco: Never  Vaping Use   Vaping Use: Never used  Substance and Sexual Activity   Alcohol use: No    Alcohol/week: 0.0 standard drinks   Drug use: No   Sexual activity: Not on file  Other Topics Concern   Not on file  Social History Narrative   Daughter lives with him.  Retired Advertising account planner   Social Determinants of Radio broadcast assistant  Strain: Not on [employer] Insecurity: No Food Insecurity   Worried About Charity fundraiser in the Last Year: Never true   Arboriculturist in the Last Year: Never true  Transportation Needs: No Data processing manager (Medical): No   Lack of Transportation (Non-Medical): No  Physical Activity: Not on file  Stress: Not on file  Social Connections: Not on file    Allergies:  Allergies  Allergen Reactions   Tuna [Fish Allergy] Nausea And Vomiting    Objective:    Vital Signs:   Temp:  [97.7 F (36.5 C)-98.6 F (37 C)] 98.4 F (36.9 C) (06/20 0757) Pulse Rate:  [60-82] 81 (06/20 0757) Resp:  [11-20] 16 (06/20 0757) BP: (85-98)/(47-60) 92/48 (06/20 0757) SpO2:  [96 %-100 %] 100 % (06/20 0757) Last BM Date: 03/03/21  Weight change: Filed Weights   03/06/21 0821 03/08/21 0940  Weight: 70.2 kg 76.4 kg    Intake/Output:   Intake/Output Summary (Last 24 hours) at 03/09/2021 1022 Last data filed at 03/08/2021 2119 Gross per 24 hour  Intake --  Output 300 ml  Net -300 ml      Physical Exam    General:  fatigued appearing. No resp difficulty HEENT: normal Neck: supple. JVP 6 cm . Carotids 2+ bilat; no bruits. No lymphadenopathy or thyromegaly appreciated. Cor: PMI nondisplaced. Regular rate & rhythm. No rubs, gallops or murmurs. Lungs: clear Abdomen: soft, nontender, nondistended. No hepatosplenomegaly. No bruits or masses. Good bowel sounds. Extremities: no cyanosis, clubbing, rash, edema, legs warm, Ulcerations Rt 1st and 2nd LE digits  Neuro: alert & orientedx3, cranial nerves grossly intact. moves all 4 extremities w/o difficulty. Affect pleasant    Telemetry   NSR 78 bpm   EKG    No new EKG to review   Labs   Basic Metabolic Panel: Recent Labs  Lab 03/03/21 1600 03/07/21 0000 03/08/21 1147 03/09/21 0241  NA 139 140 136 134*  K 4.7 4.7 4.8 4.8  CL 110 113* 109 107  CO2 19* 21* 19* 20*  GLUCOSE 97 145* 135* 117*  BUN 37* 29*  36* 36*  CREATININE 1.91* 1.74* 2.37* 2.33*  CALCIUM 9.1 7.9* 8.0* 8.1*  Liver Function Tests: Recent Labs  Lab 03/03/21 1600  AST 55*  ALT 46*  ALKPHOS 90  BILITOT 1.0  PROT 6.3*  ALBUMIN 3.4*   No results for input(s): LIPASE, AMYLASE in the last 168 hours. No results for input(s): AMMONIA in the last 168 hours.  CBC: Recent Labs  Lab 03/06/21 1936 03/07/21 0000 03/07/21 1808 03/08/21 0156 03/09/21 0241  WBC 11.3* 10.0 10.7* 10.8* 8.9  HGB 8.3* 6.8* 9.1* 8.5* 7.4*  HCT 26.8* 22.2* 29.3* 26.6* 23.8*  MCV 79.5* 79.3* 82.8 80.6 82.4  PLT 105* 102* 91* 80* 83*    Cardiac Enzymes: No results for input(s): CKTOTAL, CKMB, CKMBINDEX, TROPONINI in the last 168 hours.  BNP: BNP (last 3 results) Recent Labs    08/26/20 0622  BNP 2,235.1*    ProBNP (last 3 results) Recent Labs    08/25/20 1121  PROBNP 21,395*     CBG: Recent Labs  Lab 03/06/21 1628 03/06/21 2105 03/07/21 0610 03/07/21 1142 03/07/21 1706  GLUCAP 111* 109* 145* 145* 127*    Coagulation Studies: No results for input(s): LABPROT, INR in the last 72 hours.   Imaging   No results found.   Medications:     Current Medications:  sodium chloride   Intravenous Once   sodium chloride   Intravenous Once   amiodarone  200 mg Oral Daily   atorvastatin  40 mg Oral q1800   docusate sodium  100 mg Oral Daily   gabapentin  300 mg Oral QHS   midodrine  2.5 mg Oral TID WC   pantoprazole  40 mg Oral Daily   tamsulosin  0.4 mg Oral Daily   warfarin  5 mg Oral Daily   Warfarin - Physician Dosing Inpatient   Does not apply q1600    Infusions:  sodium chloride Stopped (03/07/21 0621)   heparin 1,100 Units/hr (03/09/21 0829)   magnesium sulfate bolus IVPB        Assessment/Plan   PAD - 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 Hypotension  - low volume status, 2/2 anemia requiring  transfusions + poor PO intake - holding HF/BP active meds - increase midodrine to 5 mg tid   3. ABLA - Hgb dropped to 6.8 post surgery, transfused x 2 units  - Hgb back down again, 9.1>>7.4 - plts also low, gradual decline 105>>80K today. On heparin gtt  - transfuse x 1 unit - check HIT panel  - consider abdominal imaging to r/o RBP  4. Oliguric AKI on Stage III CKD - baseline SCr 1.6-1.8.  Gradual increase in SCr, 1.8>>1.9>>2.4 - suspect post-op ATN 2/2 hypotension  - volume depleted 2/2 anemia w/ Hgb 7.4. Failed IV fluid challenge - transfuse again today x 1 unit and support BP w/ midodrine, will increase to 5 tid  - nephrology following and obtaining renal US  5. 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  Echo 12/21 showed EF 20-25%, moderate RV enlargement with severely decreased RV systolic function, normally functioning bioprosthetic mitral valve with no significant MR and mean gradient 6, moderate TR, dilated IVC.  Patient had normal EF in 12/13 pre-mitral valve replacement.  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  His last cath was in 1/14 pre-surgery, no significant CAD. On RHC in  12/21, evidence for severe biventricular failure with PCWP and RA pressure > 30 and PAPi 0.8.  Equalization of diastolic pressures concerning for restrictive physiology.  Cardiac output preserved (CI 2.28 Fick, 2.33 thermo).  Moderate HF limitation on 4/22 CPX.  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  Also has history of frequent PVCs.  LHC/RHC in 5/22 showed no significant coronary disease, preserved cardiac output, and low filling pressure.  Echo 6/22 with EF 40-45%, moderate RV dysfunction.   - HF meds/diuretics currently on hold for hypotension and AKI. Volume status ok on exam  - suspect anemia  contributing to persistent hypotension/AKI but may need repeat RHC to help sort things out. Given low CO2, will check lactic acid  - c/w midodrine for BP support  - also ? Amyloid w/ BiV failure R>L, conduction disease and hypotension. Will check Multiple myeloma panel and urine immunofixation. No cMRI w/ AKI. Can obtain PYP scan as outpatient.   6. PAF - in NSR, HR controlled - on Coumadin PTA w/ h/o CVA - covering w/ heparin gtt   6. Bioprosthetic Mitral Valve  - Stable function on echo in 6/22    Length of Stay: [place], PA-C  03/09/2021, 10:22 AM  Advanced Heart Failure Team Pager [phone] (M-F; 7a - 5p)  Please contact [facility] for night-coverage after hours (4p -7a ) and weekends on [email or web address]   Patient seen and examined with the above-signed Advanced Practice Provider and/or Housestaff. I personally reviewed laboratory data, imaging studies and relevant notes. I independently examined the patient and formulated the important aspects of the plan. I have edited the note to reflect any of my changes or salient points. I have personally discussed the plan with the patient and/or family.  75 y/o male with systolic HD, bioprosthetic MVR and PAD. Admitted for RLE bypass for non-healing wounds. Post-op developed hypotension and AKI.   HF meds stopped. He is getting hydrated and transfused  General:  Lying in bed No resp difficulty HEENT: normal Neck: supple. JVP 7-8. Carotids 2+ bilat; no bruits. No lymphadenopathy or thryomegaly appreciated. Cor: PMI nondisplaced. Regular rate & rhythm. No rubs, gallops or murmurs. Lungs: clear Abdomen: soft, nontender, nondistended. No hepatosplenomegaly. No bruits or masses. Good bowel sounds. Extremities: no cyanosis, clubbing, rash RLE with surgical scars/wound mild edema Neuro: alert & orientedx3, cranial nerves grossly intact. moves all 4 extremities w/o difficulty. Affect pleasant  Patient with post-op ATN in setting of  hypotension. Continue to hold all HF meds. Increase midodrine to 10 tid. He appears volume replete. Likely doesn't need much more fluid.If unable to keep MAP > 70 will need pressors. Follow daily BMET. We will follow.   Glori Bickers, MD  6:45 PM

## 2021-03-09 NOTE — NC FL2 (Signed)
[place] LEVEL OF CARE SCREENING TOOL     IDENTIFICATION  Patient Name: Ryan [name] Birthdate: 1945-12-17 Sex: male Admission Date (Current Location): 03/06/2021  [facility] and [place] Number:  Herbalist and Address:  [facility] [place], [place] [address], [place], [place] [address]      Provider Number: [id number]  Attending Physician Name and Address:  Cherre Robins, MD  Relative Name and Phone Number:  Antwuan Rueff, Daughter-in-law   [phone]    Current Level of Care: Hospital Recommended Level of Care: [place] Prior Approval Number:    Date Approved/Denied:   PASRR Number: [id number]  Discharge Plan: SNF    Current Diagnoses: Patient Active Problem List   Diagnosis Date Noted   Popliteal artery occlusion, right ([place]) 03/06/2021   PAD (peripheral artery disease) ([place]) 03/06/2021   Acute on chronic combined systolic and diastolic CHF (congestive heart failure) ([place]) 09/06/2020   CHF (congestive heart failure) ([place]) 08/26/2020   Acute combined systolic and diastolic CHF, NYHA class 3 ([place])    Stage 3 chronic kidney disease ([place])    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   PVC's (premature ventricular contractions) 06/12/2016   Junctional tachycardia ([place]) 09/16/2015   SVT (supraventricular tachycardia)  long RP    Atherosclerosis of native arteries of the extremities with ulceration ([place]) 10/23/2012   Essential hypertension 09/29/2012   Diabetes mellitus ([place]) 09/28/2012   Status post mitral valve replacement with bioprosthetic valve 09/19/2012   Acute pericarditis [id number]   Chronic systolic heart failure ([place]) 09/16/2012   Peripheral vascular disease ([place]) 09/15/2012   Acute ischemic stroke ([place]) 09/15/2012    Orientation RESPIRATION BLADDER Height & Weight     Self, Time, Situation, Place  Normal Continent Weight: 168 lb 6.9 oz (76.4 kg) Height:  6' (182.9 cm)   BEHAVIORAL SYMPTOMS/MOOD NEUROLOGICAL BOWEL NUTRITION STATUS      Continent Diet (See D/C Summary)  AMBULATORY STATUS COMMUNICATION OF NEEDS Skin   Extensive Assist Verbally Normal                       Personal Care Assistance Level of Assistance  Feeding, Dressing, Bathing Bathing Assistance: Maximum assistance Feeding assistance: Limited assistance Dressing Assistance: Limited assistance     Functional Limitations Info  Sight, Hearing, Speech Sight Info: Adequate Hearing Info: Adequate Speech Info: Adequate    SPECIAL CARE FACTORS FREQUENCY  PT (By licensed PT), OT (By licensed OT)     PT Frequency: 5x/week OT Frequency: 5x/week            Contractures Contractures Info: Not present    Additional Factors Info  Code Status, Allergies Code Status Info: Full Allergies Info: Tuna (Fish Allergy)           Current Medications (03/09/2021):  This is the current hospital active medication list Current Facility-Administered Medications  Medication Dose Route Frequency Provider Last Rate Last Admin   0.9 %  sodium chloride infusion (Manually program via Guardrails IV Fluids)   Intravenous Once Barbie Banner, PA-C       0.9 %  sodium chloride infusion (Manually program via Guardrails IV Fluids)   Intravenous Once [name], MD       0.9 %  sodium chloride infusion   Intravenous Continuous Barbie Banner, PA-C   Stopped at 03/07/21 [id number]   acetaminophen (TYLENOL) tablet 325-650 mg  325-650 mg Oral Q4H PRN Barbie Banner, PA-C  Or   acetaminophen (TYLENOL) suppository 325-650 mg  325-650 mg Rectal Q4H PRN Vaughan Basta, [place], PA-C       alum & mag hydroxide-simeth (MAALOX/MYLANTA) 200-200-20 MG/5ML suspension 15-30 mL  15-30 mL Oral Q2H PRN Setzer, [place], PA-C       amiodarone (PACERONE) tablet 200 mg  200 mg Oral Daily Barbie Banner, PA-C   200 mg at 03/09/21 [id number]   atorvastatin (LIPITOR) tablet 40 mg  40 mg Oral [id number] Barbie Banner, PA-C   40  mg at 03/08/21 1702   bisacodyl (DULCOLAX) EC tablet 5 mg  5 mg Oral Daily PRN Barbie Banner, PA-C       docusate sodium (COLACE) capsule 100 mg  100 mg Oral Daily Barbie Banner, PA-C   100 mg at 03/09/21 [id number]   gabapentin (NEURONTIN) capsule 300 mg  300 mg Oral QHS Barbie Banner, PA-C   300 mg at 03/08/21 2147   guaiFENesin-dextromethorphan (ROBITUSSIN DM) 100-10 MG/5ML syrup 15 mL  15 mL Oral Q4H PRN Barbie Banner, PA-C       heparin ADULT infusion 100 units/mL (25000 units/214m)  1,100 Units/hr Intravenous Continuous HCherre Robins MD 11 mL/hr at 03/09/21 0829 1,100 Units/hr at 03/09/21 0829   hydrALAZINE (APRESOLINE) injection 5 mg  5 mg Intravenous Q20 Min PRN SBarbie Banner PA-C       labetalol (NORMODYNE) injection 10 mg  10 mg Intravenous Q10 min PRN SBarbie Banner PA-C       magnesium sulfate IVPB 2 g 50 mL  2 g Intravenous Daily PRN SBarbie Banner PA-C       metoprolol tartrate (LOPRESSOR) injection 2-5 mg  2-5 mg Intravenous Q2H PRN Setzer, S[address] Circle PA-C       midodrine (PROAMATINE) tablet 5 mg  5 mg Oral TID WC Simmons, Brittainy M, PA-C       morphine 2 MG/ML injection 2 mg  2 mg Intravenous Q4H PRN Setzer, S[address] PA-C       ondansetron ([facility] injection 4 mg  4 mg Intravenous Q6H PRN SBarbie Banner PA-C       oxyCODONE-acetaminophen (PERCOCET/ROXICET) 5-325 MG per tablet 1-2 tablet  1-2 tablet Oral Q4H PRN SBarbie Banner PA-C   2 tablet at 03/09/21 0956   pantoprazole (PROTONIX) EC tablet 40 mg  40 mg Oral Daily SBarbie Banner PA-C   40 mg at 03/09/21 [id number]  phenol (CHLORASEPTIC) mouth spray 1 spray  1 spray Mouth/Throat PRN SBarbie Banner PA-C       polyethylene glycol (MIRALAX / GLYCOLAX) packet 17 g  17 g Oral Daily PRN Setzer, S[address] PA-C       potassium chloride SA (KLOR-CON) CR tablet 20-40 mEq  20-40 mEq Oral Daily PRN Setzer, S[address] Circle PA-C       tamsulosin (FLOMAX) capsule 0.4 mg  0.4 mg Oral Daily SBarbie Banner PA-C   0.4 mg  at 03/09/21 [id number]  warfarin (COUMADIN) tablet 5 mg  5 mg Oral Daily SBarbie Banner PA-C   5 mg at 03/09/21 [id number]  Warfarin - Physician Dosing Inpatient   Does not apply q1600 [facility]Cal[facility]        Discharge Medications: Please see discharge summary for a list of discharge medications.  Relevant Imaging Results:  Relevant Lab Results:   Additional Information SSN#: [id number] vaccinated and boosted 11/15/19, 12/17/19, 10/17/20  Rik Wadel, LCSWA

## 2021-03-10 DIAGNOSIS — I9589 Other hypotension: Secondary | ICD-10-CM | POA: Diagnosis not present

## 2021-03-10 LAB — CBC
HCT: 24.2 % — ABNORMAL LOW (ref 39.0–52.0)
Hemoglobin: 7.7 g/dL — ABNORMAL LOW (ref 13.0–17.0)
MCH: 25.2 pg — ABNORMAL LOW (ref 26.0–34.0)
MCHC: 31.8 g/dL (ref 30.0–36.0)
MCV: 79.1 fL — ABNORMAL LOW (ref 80.0–100.0)
Platelets: 115 10*3/uL — ABNORMAL LOW (ref 150–400)
RBC: 3.06 MIL/uL — ABNORMAL LOW (ref 4.22–5.81)
RDW: 19.1 % — ABNORMAL HIGH (ref 11.5–15.5)
WBC: 8.1 10*3/uL (ref 4.0–10.5)
nRBC: 0 % (ref 0.0–0.2)

## 2021-03-10 LAB — PROTIME-INR
INR: 1.3 — ABNORMAL HIGH (ref 0.8–1.2)
Prothrombin Time: 15.7 seconds — ABNORMAL HIGH (ref 11.4–15.2)

## 2021-03-10 LAB — RENAL FUNCTION PANEL
Albumin: 2.2 g/dL — ABNORMAL LOW (ref 3.5–5.0)
Anion gap: 10 (ref 5–15)
BUN: 36 mg/dL — ABNORMAL HIGH (ref 8–23)
CO2: 19 mmol/L — ABNORMAL LOW (ref 22–32)
Calcium: 8.2 mg/dL — ABNORMAL LOW (ref 8.9–10.3)
Chloride: 105 mmol/L (ref 98–111)
Creatinine, Ser: 2.02 mg/dL — ABNORMAL HIGH (ref 0.61–1.24)
GFR, Estimated: 33 mL/min — ABNORMAL LOW (ref >60)
Glucose, Bld: 111 mg/dL — ABNORMAL HIGH (ref 70–99)
Phosphorus: 3.3 mg/dL (ref 2.5–4.6)
Potassium: 4.8 mmol/L (ref 3.5–5.1)
Sodium: 134 mmol/L — ABNORMAL LOW (ref 135–145)

## 2021-03-10 LAB — HEPARIN LEVEL (UNFRACTIONATED): Heparin Unfractionated: 0.33 IU/mL (ref 0.30–0.70)

## 2021-03-10 LAB — PREALBUMIN: Prealbumin: 12.6 mg/dL — ABNORMAL LOW (ref 18–38)

## 2021-03-10 LAB — APTT: aPTT: 93 seconds — ABNORMAL HIGH (ref 24–36)

## 2021-03-10 MED ORDER — ENSURE ENLIVE PO LIQD
237.0000 mL | Freq: Two times a day (BID) | ORAL | Status: DC
  Administered 2021-03-10 – 2021-03-12 (×5): 237 mL via ORAL

## 2021-03-10 NOTE — Progress Notes (Signed)
Physical Therapy Treatment Patient Details Name: Ryan [name] MRN: [id number] DOB: March 02, 1946 Today's Date: 03/10/2021    History of Present Illness Pt is a 75 y/o male presenting for evaluation of non healing wound on 03/06/21. Pt underwent R above knee popliteal artery-peroneal bypass 6/17.  PMH includes: endocarditis s/p bioprosthetic mitral valve replacement in 2014, atrial fibrillation/flutter, HTN, DM2, PAD, CKD stage III, chronic systolic heart failure, embolic CVA, anticoagulated with coumadin    PT Comments    Pt continues to be limited due to orthostatic hypotension.  He required mod-max of 2 for transfers.  After standing , pt reports syncopal symptoms and BP 69/45, symptoms resolved with return to supine and BP up to 103/49.  Pt participated with exercises but remains weak and is rigid/stiff with transfers and exercises.  Continue to progress as able.     Follow Up Recommendations  SNF     Equipment Recommendations  3in1 (PT);Wheelchair (measurements PT);Wheelchair cushion (measurements PT)    Recommendations for Other Services       Precautions / Restrictions Precautions Precautions: Fall;Other (comment) Precaution Comments: watch orthostatic BP, hx of limited R LE dorsiflexion    Mobility  Bed Mobility Overal bed mobility: Needs Assistance Bed Mobility: Supine to Sit;Sit to Supine     Supine to sit: Mod assist;+2 for physical assistance Sit to supine: Max assist;+2 for physical assistance (due to orthostatic hypotension)   General bed mobility comments: Requiring cues for sequencing with assist for legs, trunk, and to scoot forward.  Increased time for all and pt very rigid during trnasfers    Transfers Overall transfer level: Needs assistance Equipment used: Rolling walker (2 wheeled) Transfers: Sit to/from Stand Sit to Stand: Mod assist;+2 physical assistance;From elevated surface         General transfer comment: Attempted sit to stand x 2 from  elevated bed with use of gait belt but pt unable to stand.  Required mod x 2, bed elvated, use of gait belt and pad under buttock to achieve standing.  Ambulation/Gait             General Gait Details: unable to take any steps or pivots   Stairs             Wheelchair Mobility    Modified Rankin (Stroke Patients Only)       Balance Overall balance assessment: Needs assistance Sitting-balance support: Feet supported;No upper extremity supported Sitting balance-Leahy Scale: Fair     Standing balance support: Bilateral upper extremity supported;During functional activity Standing balance-Leahy Scale: Poor Standing balance comment: Requiring RW and assist from therapist; stood 10-15 seconds                            Cognition Arousal/Alertness: Awake/alert Behavior During Therapy: Flat affect Overall Cognitive Status: Within Functional Limits for tasks assessed                                        Exercises General Exercises - Lower Extremity Ankle Circles/Pumps: AAROM;Both;10 reps;Supine (AAROM for full ROM) Quad Sets: AROM;Both;10 reps;Supine (max cues) Short Arc Quad: AROM;Both;10 reps;Supine Heel Slides: AROM;Left;AAROM;Right;10 reps;Supine    General Comments General comments (skin integrity, edema, etc.): BP in supine 93/51, sitting 102/49, pt then stood with therapist for a few seconds (unable to get BP in standing), returned to sitting and after 1-2 mins pt  reports dizzy, checked and BP 69/45, returned to supine BP 103/49 and symptoms resolved      Pertinent Vitals/Pain Pain Assessment: Faces Faces Pain Scale: Hurts even more Pain Location: R LE Pain Descriptors / Indicators: Discomfort;Grimacing;Guarding Pain Intervention(s): Monitored during session;Premedicated before session;Repositioned;Limited activity within patient's tolerance    Home Living                      Prior Function            PT Goals  (current goals can now be found in the care plan section) Acute Rehab PT Goals Patient Stated Goal: get stronger and go home PT Goal Formulation: With patient Time For Goal Achievement: 03/22/21 Potential to Achieve Goals: Good Progress towards PT goals: Not progressing toward goals - comment (limited by orthostatic hypotension)    Frequency    Min 2X/week      PT Plan Current plan remains appropriate    Co-evaluation              AM-PAC PT "6 Clicks" Mobility   Outcome Measure  Help needed turning from your back to your side while in a flat bed without using bedrails?: A Little Help needed moving from lying on your back to sitting on the side of a flat bed without using bedrails?: A Lot Help needed moving to and from a bed to a chair (including a wheelchair)?: Total Help needed standing up from a chair using your arms (e.g., wheelchair or bedside chair)?: Total Help needed to walk in hospital room?: Total Help needed climbing 3-5 steps with a railing? : Total 6 Click Score: 9    End of Session Equipment Utilized During Treatment: Gait belt Activity Tolerance: Treatment limited secondary to medical complications (Comment) (low BP) Patient left: in bed;with call bell/phone within reach;with bed alarm set Nurse Communication: Mobility status (BP) PT Visit Diagnosis: Unsteadiness on feet (R26.81);Other abnormalities of gait and mobility (R26.89);Muscle weakness (generalized) (M62.81);Difficulty in walking, not elsewhere classified (R26.2);Dizziness and giddiness (R42);Pain Pain - Right/Left: Right Pain - part of body: Leg     Time: 1414-1440 PT Time Calculation (min) (ACUTE ONLY): 26 min  Charges:  $Therapeutic Exercise: 8-22 mins $Therapeutic Activity: 8-22 mins                     Abran Richard, PT Acute Rehab Services Pager [phone] [facility] [phone]    Karlton Lemon 03/10/2021, 2:55 PM

## 2021-03-10 NOTE — Progress Notes (Addendum)
Mobility Specialist - Progress Note   03/10/21 1122  Mobility  Activity Turned to left side;Turned to right side  Level of Assistance Moderate assist, patient does 50-74%  Assistive Device Other (Comment) (bed rails)  Mobility Response Tolerated fair  Mobility performed by Mobility specialist  $Mobility charge 1 Mobility   Pre-mobility: 79 HR, 101/50 BP, 100% SpO2 Post-mobility: 89 HR, 102/52 BP, 100% SpO2  Pt wanting to transfer to [facility] to pass a BM, but unable to sit up on edge of bed due to 7/10 RLE pain. Pt was assisted on to bed pan and then cleaned. RN in room to give pain meds.   Pricilla Handler Mobility Specialist Mobility Specialist Phone: [phone]

## 2021-03-10 NOTE — Progress Notes (Addendum)
Advanced Heart Failure Rounding Note  PCP-Cardiologist: Lesleigh Noe, MD  AHF: Dr. Shirlee Latch   Subjective:    Midodrine increased yesterday to 10 tid. SBPs still soft, 90s-low 100s. Cuff MAPs > 65   Scr improved today. Received 80 IV lasix per nephrology, SCr 2.3>>2.0.   Wt up 1 lb.   Transfused 1 additional unit of RBCs yesterday. Iron stores low.   Feels "ok" currently. No dyspnea.    Objective:   Weight Range: 76.9 kg Body mass index is 22.99 kg/m.   Vital Signs:   Temp:  [97.8 F (36.6 C)-98.6 F (37 C)] 98 F (36.7 C) (06/21 0733) Pulse Rate:  [72-82] 80 (06/21 0733) Resp:  [13-16] 16 (06/21 0733) BP: (89-105)/(46-54) 98/47 (06/21 0733) SpO2:  [100 %] 100 % (06/21 0733) Weight:  [76.9 kg] 76.9 kg (06/21 0300) Last BM Date: 03/03/21  Weight change: Filed Weights   03/06/21 0821 03/08/21 0940 03/10/21 0300  Weight: 70.2 kg 76.4 kg 76.9 kg    Intake/Output:   Intake/Output Summary (Last 24 hours) at 03/10/2021 1039 Last data filed at 03/10/2021 0734 Gross per 24 hour  Intake 1009.83 ml  Output 1700 ml  Net -690.17 ml      Physical Exam    General:  fatigue appearing. No resp difficulty HEENT: Normal Neck: Supple. JVP  8 cm . Carotids 2+ bilat; no bruits. No lymphadenopathy or thyromegaly appreciated. Cor: PMI nondisplaced. Regular rate & rhythm. No rubs, gallops or murmurs. Lungs: Clear Abdomen: Soft, nontender, nondistended. No hepatosplenomegaly. No bruits or masses. Good bowel sounds. Extremities: No cyanosis, clubbing, rash, edema + rt leg surgical incision, intact no drainage  Neuro: Alert & orientedx3, cranial nerves grossly intact. moves all 4 extremities w/o difficulty. Affect pleasant   Telemetry   NSR 90s   EKG    No new EKG to review   Labs    CBC Recent Labs    03/08/21 0156 03/09/21 0241  WBC 10.8* 8.9  HGB 8.5* 7.4*  HCT 26.6* 23.8*  MCV 80.6 82.4  PLT 80* 83*   Basic Metabolic Panel Recent Labs     03/09/21 0241 03/10/21 0111  NA 134* 134*  K 4.8 4.8  CL 107 105  CO2 20* 19*  GLUCOSE 117* 111*  BUN 36* 36*  CREATININE 2.33* 2.02*  CALCIUM 8.1* 8.2*  PHOS  --  3.3   Liver Function Tests Recent Labs    03/10/21 0111  ALBUMIN 2.2*   No results for input(s): LIPASE, AMYLASE in the last 72 hours. Cardiac Enzymes No results for input(s): CKTOTAL, CKMB, CKMBINDEX, TROPONINI in the last 72 hours.  BNP: BNP (last 3 results) Recent Labs    08/26/20 0622  BNP 2,235.1*    ProBNP (last 3 results) Recent Labs    08/25/20 1121  PROBNP 21,395*     D-Dimer No results for input(s): DDIMER in the last 72 hours. Hemoglobin A1C No results for input(s): HGBA1C in the last 72 hours. Fasting Lipid Panel No results for input(s): CHOL, HDL, LDLCALC, TRIG, CHOLHDL, LDLDIRECT in the last 72 hours. Thyroid Function Tests No results for input(s): TSH, T4TOTAL, T3FREE, THYROIDAB in the last 72 hours.  Invalid input(s): FREET3  Other results:   Imaging    US RENAL  Result Date: 03/09/2021 CLINICAL DATA:  Acute renal failure EXAM: RENAL / URINARY TRACT ULTRASOUND COMPLETE COMPARISON:  None FINDINGS: Right Kidney: Renal measurements: 10.6 x 4.5 x 4.7 cm. = volume: 115 mL. Echogenicity within normal  limits. No mass or hydronephrosis visualized. Left Kidney: Renal measurements: 11.0 x 4.4 x 4.6 cm. = volume: 117 mL. Echogenicity within normal limits. No mass or hydronephrosis visualized. Bladder: Appears normal for degree of bladder distention. Other: None. IMPRESSION: Unremarkable renal ultrasound. Electronically Signed   By: Inez Catalina M.D.   On: 03/09/2021 21:15     Medications:     Scheduled Medications:  sodium chloride   Intravenous Once   sodium chloride   Intravenous Once   amiodarone  200 mg Oral Daily   atorvastatin  40 mg Oral q1800   docusate sodium  100 mg Oral Daily   feeding supplement  237 mL Oral BID BM   gabapentin  300 mg Oral QHS   midodrine  10 mg Oral  TID WC   pantoprazole  40 mg Oral Daily   tamsulosin  0.4 mg Oral Daily   warfarin  5 mg Oral Daily   Warfarin - Physician Dosing Inpatient   Does not apply q1600    Infusions:  sodium chloride Stopped (03/07/21 0569)   heparin 1,100 Units/hr (03/09/21 2028)   magnesium sulfate bolus IVPB      PRN Medications: acetaminophen **OR** acetaminophen, alum & mag hydroxide-simeth, bisacodyl, guaiFENesin-dextromethorphan, hydrALAZINE, labetalol, magnesium sulfate bolus IVPB, metoprolol tartrate, morphine injection, ondansetron, oxyCODONE-acetaminophen, phenol, polyethylene glycol, potassium chloride   Assessment/Plan   PAD - 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 Hypotension  - low volume status, 2/2 anemia requiring transfusions + poor PO intake - holding HF/BP active meds - MAP goal > 70 - increase midodrine to 15 tid    3. ABLA - 2/2 surgery  - received total of 3 units post operatively  - management per VVS     4. AKI on Stage III CKD - baseline SCr 1.6-1.8.  Gradual increase in SCr, 1.8>>1.9>>2.4 - suspect post-op ATN 2/2 hypotension - renal US unremarkable  - Scr improving, down to 2.0 today  - continue w/ midodrine for BP support  - nephrology following    5. 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  Echo 12/21 showed EF 20-25%, moderate RV enlargement with severely decreased RV systolic function, normally functioning bioprosthetic mitral valve with no significant MR and mean gradient 6, moderate TR, dilated IVC.  Patient had normal EF in 12/13 pre-mitral valve replacement.  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  His last cath was in 1/14 pre-surgery, no significant CAD. On [place] in 12/21, evidence for severe biventricular failure with PCWP and RA  pressure > 30 and PAPi 0.8.  Equalization of diastolic pressures concerning for restrictive physiology.  Cardiac output preserved (CI 2.28 Fick, 2.33 thermo).  Moderate HF limitation on 4/22 CPX.  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  Also has history of frequent PVCs.  LHC/RHC in 5/22 showed no significant coronary disease, preserved cardiac output, and low filling pressure.  Echo 6/22 with EF 40-45%, moderate RV dysfunction.   - HF meds/diuretics currently on hold for hypotension and AKI. Received IVFs. Volume status ok on exam. Now volume replete.  - c/w midodrine for BP support - also ? Amyloid w/ BiV failure R>L, conduction disease and hypotension. Will check Multiple myeloma panel and  urine immunofixation (in process) No cMRI w/ AKI. Can obtain PYP scan as outpatient.   6. PAF - in NSR, HR controlled - on Coumadin PTA w/ h/o CVA - covering w/ heparin gtt   6. Bioprosthetic Mitral Valve - Stable function on echo in 6/22  Length of Stay: [id number] [place]  03/10/2021, 10:39 AM  Advanced Heart Failure Team Pager [phone] (M-F; 7a - 5p)  Please contact [facility] for night-coverage after hours (5p -7a ) and weekends on [email or web address]  Patient seen and examined with the above-signed Advanced Practice Provider and/or Housestaff. I personally reviewed laboratory data, imaging studies and relevant notes. I independently examined the patient and formulated the important aspects of the plan. I have edited the note to reflect any of my changes or salient points. I have personally discussed the plan with the patient and/or family.  Feels ok. Denies CP or SOB. SCR improving. BP stil soft. Received IV lasix from Nephrology.   General:  Sitting up in bed.  No resp difficulty HEENT: normal Neck: supple. JVP mildly elevated Carotids 2+ bilat; no bruits. No lymphadenopathy or thryomegaly appreciated. Cor: PMI  nondisplaced. Regular rate & rhythm. No rubs, gallops or murmurs. Lungs: clear Abdomen: soft, nontender, nondistended. No hepatosplenomegaly. No bruits or masses. Good bowel sounds. Extremities: no cyanosis, clubbing, rash, 1+ edema in RLE with wounds  Neuro: alert & orientedx3, cranial nerves grossly intact. moves all 4 extremities w/o difficulty. Affect pleasant  SCr improving. Volume status managed by Renal. Agree with increasing midodrine to 15 tid.   Glori Bickers, MD  3:50 PM

## 2021-03-10 NOTE — Progress Notes (Signed)
[facility] [place] PROGRESS NOTE  ASSESSMENT / PLAN: Ryan [name] is a 75 y.o. male status post right popliteal - peroneal bypass 03/06/21 for critical limb ischemia with ulceration.  PRN pain control. PT / OT / OOB / Ambulate.  BP improved overnight with midodrine. Holding antihypertensives.  Continue heparin transition to Coumadin for MVR. History of HFrEF (40%). Pulmonary hygiene: IS / OOB. Diet as tolerated. Start supplements. Check prealbumin.  AKI on CKD III. Improving today.  Check CBC this AM. Continue therapeutic anticoagulation.  Will ultimately need SNF.  SUBJECTIVE: No interval changes. Right leg painful. Limited mobility.  OBJECTIVE: BP (!) 98/47 (BP Location: Right Arm)   Pulse 80   Temp 98 F (36.7 C) (Oral)   Resp 16   Ht 6' (1.829 m)   Wt 76.9 kg   SpO2 100%   BMI 22.99 kg/m   Intake/Output Summary (Last 24 hours) at 03/10/2021 0751 Last data filed at 03/10/2021 0734 Gross per 24 hour  Intake 1249.83 ml  Output 2000 ml  Net -750.17 ml    No distress Regular rate and rhythm Unlabored R leg incisions clean and dry Palpable bypass in calf Strong DS in peroneal  CBC Latest Ref Rng & Units 03/09/2021 03/08/2021 03/07/2021  WBC 4.0 - 10.5 K/uL 8.9 10.8(H) 10.7(H)  Hemoglobin 13.0 - 17.0 g/dL 7.4(L) 8.5(L) 9.1(L)  Hematocrit 39.0 - 52.0 % 23.8(L) 26.6(L) 29.3(L)  Platelets 150 - 400 K/uL 83(L) 80(L) 91(L)     CMP Latest Ref Rng & Units 03/10/2021 03/09/2021 03/08/2021  Glucose 70 - 99 mg/dL 111(H) 117(H) 135(H)  BUN 8 - 23 mg/dL 36(H) 36(H) 36(H)  Creatinine 0.61 - 1.24 mg/dL 2.02(H) 2.33(H) 2.37(H)  Sodium 135 - 145 mmol/L 134(L) 134(L) 136  Potassium 3.5 - 5.1 mmol/L 4.8 4.8 4.8  Chloride 98 - 111 mmol/L 105 107 109  CO2 22 - 32 mmol/L 19(L) 20(L) 19(L)  Calcium 8.9 - 10.3 mg/dL 8.2(L) 8.1(L) 8.0(L)  Total Protein 6.5 - 8.1 g/dL - - -  Total Bilirubin 0.3 - 1.2 mg/dL - - -  Alkaline Phos 38 - 126 U/L - - -  AST 15 - 41  U/L - - -  ALT 0 - 44 U/L - - -    Estimated Creatinine Clearance: 33.3 mL/min (A) (by C-G formula based on SCr of 2.02 mg/dL (H)).  [name]. [name], MD [facility] Phone Number: [phone] 03/10/2021 7:51 AM

## 2021-03-10 NOTE — TOC Progression Note (Signed)
Transition of Care (TOC) - Progression Note  Heart Failure   Patient Details  Name: Ryan [name] MRN: [id number] Date of Birth: December 20, 1945  Transition of Care [facility]) CM/SW [place] Phone Number: 03/10/2021, 10:06 AM  Clinical Narrative:    CSW spoke with [name] at [facility] who reported she started insurance authorization for [name] and will let the CSW know once the authorization is back.   CSW will continue to follow throughout discharge.   Expected Discharge Plan: [place] Barriers to Discharge: Continued Medical Work up  Expected Discharge Plan and Services Expected Discharge Plan: [place] In-house Referral: Clinical Social Work Discharge Planning Services: CM Consult   Living arrangements for the past 2 months: Single Family Home                                       Social Determinants of Health (SDOH) Interventions    Readmission Risk Interventions No flowsheet data found.  Broxton Broady, MSW, [place] Heart Failure Social Worker

## 2021-03-10 NOTE — Progress Notes (Signed)
[name] ROUNDING NOTE   Subjective:   Interval History: This is a 75 year old man with history of mitral valve replacement 2014, ardiomyopathy, Atrial fibrillation and congestive heart disease who has severe peripheral vascular disease and underwent successful right fem/pop bypass 03/06/21.   He has known chronic kidney disease with baseline creatinine of 1.7 mg/dl   Post operative he has been oliguric despite IV fluids  and has a positive fluid balance of 5 L      His hemodynamics have been fairly soft with a BP of about [id number] systolic.  He appears comfortable this morning with no complaints  Blood pressure 102/52 pulse 95 temperature 98 O2 sats 100% room air   Sodium 134 potassium 4.8 chloride 105 CO2 19 BUN 36 creatinine 2.0 glucose 111 calcium 8.2 phosphorus 3.2 albumin 2.2 hemoglobin 7.4 iron saturations 15   Urine output 1.3 L.  Status post transfusion 03/09/2021     Objective:  Vital signs in last 24 hours:  Temp:  [97.8 F (36.6 C)-98.6 F (37 C)] 98 F (36.7 C) (06/21 0733) Pulse Rate:  [72-82] 80 (06/21 0733) Resp:  [13-16] 16 (06/21 0733) BP: (89-105)/(46-54) 98/47 (06/21 0733) SpO2:  [100 %] 100 % (06/21 0733) Weight:  [76.9 kg] 76.9 kg (06/21 0300)  Weight change: 0.5 kg Filed Weights   03/06/21 0821 03/08/21 0940 03/10/21 0300  Weight: 70.2 kg 76.4 kg 76.9 kg    Intake/Output: I/O last 3 completed shifts: In: 1249.8 [P.O.:480; I.V.:454.8; Blood:315] Out: 2000 [Urine:2000]   Intake/Output this shift:  Total I/O In: -  Out: 300 [Urine:300]   Comfortable alert nondistressed CVS-  regular rate and rhythm no murmurs rubs or gallops RS- CTA no wheeze or rales ABD- BS present soft non-distended EXT-right leg incision clean and dry   Basic Metabolic Panel: Recent Labs  Lab 03/03/21 1600 03/07/21 0000 03/08/21 1147 03/09/21 0241 03/10/21 0111  NA 139 140 136 134* 134*  K 4.7 4.7 4.8 4.8 4.8  CL 110 113* 109 107 105  CO2 19*  21* 19* 20* 19*  GLUCOSE 97 145* 135* 117* 111*  BUN 37* 29* 36* 36* 36*  CREATININE 1.91* 1.74* 2.37* 2.33* 2.02*  CALCIUM 9.1 7.9* 8.0* 8.1* 8.2*  PHOS  --   --   --   --  3.3    Liver Function Tests: Recent Labs  Lab 03/03/21 1600 03/10/21 0111  AST 55*  --   ALT 46*  --   ALKPHOS 90  --   BILITOT 1.0  --   PROT 6.3*  --   ALBUMIN 3.4* 2.2*   No results for input(s): LIPASE, AMYLASE in the last 168 hours. No results for input(s): AMMONIA in the last 168 hours.  CBC: Recent Labs  Lab 03/06/21 1936 03/07/21 0000 03/07/21 1808 03/08/21 0156 03/09/21 0241  WBC 11.3* 10.0 10.7* 10.8* 8.9  HGB 8.3* 6.8* 9.1* 8.5* 7.4*  HCT 26.8* 22.2* 29.3* 26.6* 23.8*  MCV 79.5* 79.3* 82.8 80.6 82.4  PLT 105* 102* 91* 80* 83*    Cardiac Enzymes: No results for input(s): CKTOTAL, CKMB, CKMBINDEX, TROPONINI in the last 168 hours.  BNP: Invalid input(s): POCBNP  CBG: Recent Labs  Lab 03/06/21 1628 03/06/21 2105 03/07/21 0610 03/07/21 1142 03/07/21 1706  GLUCAP 111* 109* 145* 145* 127*    Microbiology: Results for orders placed or performed during the hospital encounter of 03/03/21  Surgical pcr screen     Status: None   Collection Time: 03/03/21  3:37  PM   Specimen: Nasal Mucosa; Nasal Swab  Result Value Ref Range Status   MRSA, PCR NEGATIVE NEGATIVE Final   Staphylococcus aureus NEGATIVE NEGATIVE Final    Comment: (NOTE) The Xpert SA Assay (FDA approved for NASAL specimens in patients 10 years of age and older), is one component of a comprehensive surveillance program. It is not intended to diagnose infection nor to guide or monitor treatment. Performed at [facility], [place] [address], [place], [place] [address]   SARS CORONAVIRUS 2 (TAT 6-24 HRS) Nasopharyngeal Nasopharyngeal Swab     Status: None   Collection Time: 03/03/21  3:38 PM   Specimen: Nasopharyngeal Swab  Result Value Ref Range Status   SARS Coronavirus 2 NEGATIVE NEGATIVE Final    Comment:  (NOTE) SARS-CoV-2 target nucleic acids are NOT DETECTED.  The SARS-CoV-2 RNA is generally detectable in upper and lower respiratory specimens during the acute phase of infection. Negative results do not preclude SARS-CoV-2 infection, do not rule out co-infections with other pathogens, and should not be used as the sole basis for treatment or other patient management decisions. Negative results must be combined with clinical observations, patient history, and epidemiological information. The expected result is Negative.  Fact Sheet for Patients: SugarRoll.be  Fact Sheet for Healthcare Providers: https://www.woods-mathews.com/  This test is not yet approved or cleared by the [place] FDA and  has been authorized for detection and/or diagnosis of SARS-CoV-2 by FDA under an Emergency Use Authorization (EUA). This EUA will remain  in effect (meaning this test can be used) for the duration of the COVID-19 declaration under Se ction 564(b)(1) of the Act, 21 U.S.C. section 360bbb-3(b)(1), unless the authorization is terminated or revoked sooner.  Performed at [facility], [place] [address], [place], [place] [address]     Coagulation Studies: Recent Labs    03/09/21 1556 03/10/21 0111  LABPROT 16.7* 15.7*  INR 1.4* 1.3*    Urinalysis: Recent Labs    03/09/21 1311  COLORURINE YELLOW  LABSPEC 1.011  PHURINE 5.0  GLUCOSEU >=500*  HGBUR SMALL*  BILIRUBINUR NEGATIVE  KETONESUR NEGATIVE  PROTEINUR NEGATIVE  NITRITE NEGATIVE  LEUKOCYTESUR NEGATIVE      Imaging: US RENAL  Result Date: 03/09/2021 CLINICAL DATA:  Acute renal failure EXAM: RENAL / URINARY TRACT ULTRASOUND COMPLETE COMPARISON:  None FINDINGS: Right Kidney: Renal measurements: 10.6 x 4.5 x 4.7 cm. = volume: 115 mL. Echogenicity within normal limits. No mass or hydronephrosis visualized. Left Kidney: Renal measurements: 11.0 x 4.4 x 4.6 cm. = volume: 117 mL.  Echogenicity within normal limits. No mass or hydronephrosis visualized. Bladder: Appears normal for degree of bladder distention. Other: None. IMPRESSION: Unremarkable renal ultrasound. Electronically Signed   By: Inez Catalina M.D.   On: 03/09/2021 21:15     Medications:    sodium chloride Stopped (03/07/21 [id number])   heparin 1,100 Units/hr (03/09/21 2028)   magnesium sulfate bolus IVPB      sodium chloride   Intravenous Once   sodium chloride   Intravenous Once   amiodarone  200 mg Oral Daily   atorvastatin  40 mg Oral q1800   docusate sodium  100 mg Oral Daily   feeding supplement  237 mL Oral BID BM   gabapentin  300 mg Oral QHS   midodrine  10 mg Oral TID WC   pantoprazole  40 mg Oral Daily   tamsulosin  0.4 mg Oral Daily   warfarin  5 mg Oral Daily   Warfarin -  Physician Dosing Inpatient   Does not apply q1600   acetaminophen **OR** acetaminophen, alum & mag hydroxide-simeth, bisacodyl, guaiFENesin-dextromethorphan, hydrALAZINE, labetalol, magnesium sulfate bolus IVPB, metoprolol tartrate, morphine injection, ondansetron, oxyCODONE-acetaminophen, phenol, polyethylene glycol, potassium chloride  Assessment/ Plan:   Acute Kidney injury  oliguric also some low blood pressures  strongly suspect that this is secondary to post op ATN.  Renal ultrasound unremarkable.  Urinalysis bland.  Will need to avoid nephrotoxins and IV contrast. Monitor I's and o's closely. Daily renal panel. No nephrotoxins on board at this time.  Creatinine appears to be improving HTN/vol we will continue to follow. Anemia LDH 225 iron saturations low 15% B12 1128.  Status post transfusion Afib. MVR  Systolic heart failure  agree with cardoiology consult Dm   jardiance on hold  - agree    LOS: 4 Sherril Croon '@TODAY''@11'$ :41 AM

## 2021-03-10 NOTE — Progress Notes (Signed)
ANTICOAGULATION CONSULT NOTE - Consult  Pharmacy Consult for heparin Indication: atrial fibrillation  Allergies  Allergen Reactions   Tuna [Fish Allergy] Nausea And Vomiting    Patient Measurements: Height: 6' (182.9 cm) Weight: 76.9 kg (169 lb 8.5 oz) IBW/kg (Calculated) : 77.6 Heparin Dosing Weight: 70.2  Vital Signs: Temp: 97.8 F (36.6 C) (06/21 1200) Temp Source: Oral (06/21 1200) BP: 94/56 (06/21 1200) Pulse Rate: 96 (06/21 1200)  Labs: Recent Labs    03/07/21 1808 03/08/21 0156 03/08/21 1147 03/09/21 0241 03/09/21 1556 03/10/21 0111  HGB 9.1* 8.5*  --  7.4*  --   --   HCT 29.3* 26.6*  --  23.8*  --   --   PLT 91* 80*  --  83*  --   --   APTT  --  66*  --  68*  --  93*  LABPROT  --   --   --   --  16.7* 15.7*  INR  --   --   --   --  1.4* 1.3*  HEPARINUNFRC  --   --   --  0.28* 0.30 0.33  CREATININE  --   --  2.37* 2.33*  --  2.02*     Estimated Creatinine Clearance: 33.3 mL/min (A) (by C-G formula based on SCr of 2.02 mg/dL (H)).   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 Warfarin held for procedures PTA and started on Lovenox 80 mg BID (Last dose 6/16 @ 1300) > heparin gtt for surgery. Was on low dose heparin while being transfused 6/18. Moved to therapeutic dosing by MD and monitored with APTTs. Pharmacy consulted for  heparin management.  Of note, recent right and left heart cath (May 2022) without evidence of CAD  Heparin level at goal this morning.  No overt bleeding or complications noted. S/p PRBCs x 2 on 6/18.  MD dosing warfarin at '5mg'$  daily. Ordered daily INR. PTA warf dose '5mg'$  qTTSS and 2.5 mg qMWF  Goal of Therapy:  INR 2-3 Heparin level 0.3-0.7 units/ml Monitor platelets by anticoagulation protocol: Yes   Plan:  Continue IV heparin at 1100 units/hr Daily heparin level and CBC. Warfarin 5 mg PO daily per MD dosing. F/u INR  [place] Crane, Roylene Reason, [facility] Clinical Pharmacist  03/10/2021 2:05 PM   [facility] pharmacy phone numbers are listed on [email or web address]

## 2021-03-11 DIAGNOSIS — I9589 Other hypotension: Secondary | ICD-10-CM | POA: Diagnosis not present

## 2021-03-11 LAB — RENAL FUNCTION PANEL
Albumin: 2.2 g/dL — ABNORMAL LOW (ref 3.5–5.0)
Anion gap: 5 (ref 5–15)
BUN: 33 mg/dL — ABNORMAL HIGH (ref 8–23)
CO2: 23 mmol/L (ref 22–32)
Calcium: 8.3 mg/dL — ABNORMAL LOW (ref 8.9–10.3)
Chloride: 105 mmol/L (ref 98–111)
Creatinine, Ser: 1.87 mg/dL — ABNORMAL HIGH (ref 0.61–1.24)
GFR, Estimated: 37 mL/min — ABNORMAL LOW (ref >60)
Glucose, Bld: 114 mg/dL — ABNORMAL HIGH (ref 70–99)
Phosphorus: 2.7 mg/dL (ref 2.5–4.6)
Potassium: 5 mmol/L (ref 3.5–5.1)
Sodium: 133 mmol/L — ABNORMAL LOW (ref 135–145)

## 2021-03-11 LAB — CBC
HCT: 25.4 % — ABNORMAL LOW (ref 39.0–52.0)
Hemoglobin: 8.1 g/dL — ABNORMAL LOW (ref 13.0–17.0)
MCH: 25.2 pg — ABNORMAL LOW (ref 26.0–34.0)
MCHC: 31.9 g/dL (ref 30.0–36.0)
MCV: 78.9 fL — ABNORMAL LOW (ref 80.0–100.0)
Platelets: 110 10*3/uL — ABNORMAL LOW (ref 150–400)
RBC: 3.22 MIL/uL — ABNORMAL LOW (ref 4.22–5.81)
RDW: 19.3 % — ABNORMAL HIGH (ref 11.5–15.5)
WBC: 7.8 10*3/uL (ref 4.0–10.5)
nRBC: 0 % (ref 0.0–0.2)

## 2021-03-11 LAB — PROTIME-INR
INR: 1.3 — ABNORMAL HIGH (ref 0.8–1.2)
Prothrombin Time: 16.1 seconds — ABNORMAL HIGH (ref 11.4–15.2)

## 2021-03-11 LAB — HEPARIN LEVEL (UNFRACTIONATED)
Heparin Unfractionated: 0.27 IU/mL — ABNORMAL LOW (ref 0.30–0.70)
Heparin Unfractionated: 0.43 IU/mL (ref 0.30–0.70)

## 2021-03-11 LAB — IMMUNOFIXATION, URINE

## 2021-03-11 LAB — APTT: aPTT: 80 seconds — ABNORMAL HIGH (ref 24–36)

## 2021-03-11 MED ORDER — WARFARIN SODIUM 7.5 MG PO TABS: 7.5000 mg | ORAL_TABLET | Freq: Once | ORAL | Status: DC

## 2021-03-11 MED ORDER — WARFARIN SODIUM 2.5 MG PO TABS
2.5000 mg | ORAL_TABLET | Freq: Once | ORAL | Status: AC
  Administered 2021-03-11: 2.5 mg via ORAL
  Filled 2021-03-11: qty 1

## 2021-03-11 MED ORDER — WARFARIN - PHARMACIST DOSING INPATIENT: Freq: Every day | Status: DC

## 2021-03-11 NOTE — Progress Notes (Addendum)
Advanced Heart Failure Rounding Note  PCP-Cardiologist: [name], MD  AHF: Dr. Aundra Dubin   Subjective:    6/21 Midodrine increased 15 mg tid.    Feels much better. Denies SOB.  Objective:   Weight Range: 76.7 kg Body mass index is 22.93 kg/m.   Vital Signs:   Temp:  [97.6 F (36.4 C)-98.3 F (36.8 C)] 97.6 F (36.4 C) (06/22 1224) Pulse Rate:  [66-85] 79 (06/22 0743) Resp:  [13-20] 13 (06/22 1224) BP: (88-128)/(49-70) 128/70 (06/22 1224) SpO2:  [98 %-100 %] 100 % (06/22 1224) Weight:  [76.7 kg] 76.7 kg (06/22 0552) Last BM Date: 03/11/21  Weight change: Filed Weights   03/08/21 0940 03/10/21 0300 03/11/21 0552  Weight: 76.4 kg 76.9 kg 76.7 kg    Intake/Output:   Intake/Output Summary (Last 24 hours) at 03/11/2021 1241 Last data filed at 03/11/2021 0600 Gross per 24 hour  Intake 727.25 ml  Output 475 ml  Net 252.25 ml      Physical Exam   General:   No resp difficulty HEENT: normal Neck: supple. no JVD. Carotids 2+ bilat; no bruits. No lymphadenopathy or thryomegaly appreciated. Cor: PMI nondisplaced. Regular rate & rhythm. No rubs, gallops or murmurs. Lungs: clear Abdomen: soft, nontender, nondistended. No hepatosplenomegaly. No bruits or masses. Good bowel sounds. Extremities: no cyanosis, clubbing, rash, RLE incision Neuro: alert & orientedx3, cranial nerves grossly intact. moves all 4 extremities w/o difficulty. Affect pleasant   Telemetry   NSR 90s   EKG    No new EKG to review   Labs    CBC Recent Labs    03/10/21 2126 03/11/21 0107  WBC 8.1 7.8  HGB 7.7* 8.1*  HCT 24.2* 25.4*  MCV 79.1* 78.9*  PLT 115* 035*   Basic Metabolic Panel Recent Labs    03/10/21 0111 03/11/21 0107  NA 134* 133*  K 4.8 5.0  CL 105 105  CO2 19* 23  GLUCOSE 111* 114*  BUN 36* 33*  CREATININE 2.02* 1.87*  CALCIUM 8.2* 8.3*  PHOS 3.3 2.7   Liver Function Tests Recent Labs    03/10/21 0111 03/11/21 0107  ALBUMIN 2.2* 2.2*   No  results for input(s): LIPASE, AMYLASE in the last 72 hours. Cardiac Enzymes No results for input(s): CKTOTAL, CKMB, CKMBINDEX, TROPONINI in the last 72 hours.  BNP: BNP (last 3 results) Recent Labs    08/26/20 0622  BNP 2,235.1*    ProBNP (last 3 results) Recent Labs    08/25/20 1121  PROBNP 21,395*     D-Dimer No results for input(s): DDIMER in the last 72 hours. Hemoglobin A1C No results for input(s): HGBA1C in the last 72 hours. Fasting Lipid Panel No results for input(s): CHOL, HDL, LDLCALC, TRIG, CHOLHDL, LDLDIRECT in the last 72 hours. Thyroid Function Tests No results for input(s): TSH, T4TOTAL, T3FREE, THYROIDAB in the last 72 hours.  Invalid input(s): FREET3  Other results:   Imaging    No results found.   Medications:     Scheduled Medications:  amiodarone  200 mg Oral Daily   atorvastatin  40 mg Oral q1800   docusate sodium  100 mg Oral Daily   feeding supplement  237 mL Oral BID BM   gabapentin  300 mg Oral QHS   midodrine  10 mg Oral TID WC   pantoprazole  40 mg Oral Daily   tamsulosin  0.4 mg Oral Daily   warfarin  5 mg Oral Daily   Warfarin - Physician  Dosing Inpatient   Does not apply q1600    Infusions:  sodium chloride Stopped (03/07/21 9244)   heparin 1,200 Units/hr (03/11/21 0857)   magnesium sulfate bolus IVPB      PRN Medications: acetaminophen **OR** acetaminophen, alum & mag hydroxide-simeth, bisacodyl, guaiFENesin-dextromethorphan, hydrALAZINE, labetalol, magnesium sulfate bolus IVPB, metoprolol tartrate, morphine injection, ondansetron, oxyCODONE-acetaminophen, phenol, polyethylene glycol, potassium chloride   Assessment/Plan   PAD - 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 Hypotension  - low volume status, 2/2 anemia requiring transfusions + poor PO intake - holding HF/BP active meds - MAP goal > 70 -Continue midodrine to  15 tid    3. ABLA - 2/2 surgery  - received total of 3 units post operatively  - management per [facility]     4. AKI on Stage III CKD - baseline SCr 1.6-1.8.  Gradual increase in SCr, 1.8>>1.9>>2.4> 1.9  - suspect post-op ATN 2/2 hypotension - renal US unremarkable  - Continue to hold jardiance.  - continue w/ midodrine for BP support  - nephrology signed off today.    5. 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  Echo 12/21 showed EF 20-25%, moderate RV enlargement with severely decreased RV systolic function, normally functioning bioprosthetic mitral valve with no significant MR and mean gradient 6, moderate TR, dilated IVC.  Patient had normal EF in 12/13 pre-mitral valve replacement.  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  His last cath was in 1/14 pre-surgery, no significant CAD. On [facility] in 12/21, evidence for severe biventricular failure with PCWP and RA pressure > 30 and PAPi 0.8.  Equalization of diastolic pressures concerning for restrictive physiology.  Cardiac output preserved (CI 2.28 Fick, 2.33 thermo).  Moderate HF limitation on 4/22 CPX.  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  Also has history of frequent PVCs.  LHC/RHC in 5/22 showed no significant coronary disease, preserved cardiac output, and low filling pressure.  Echo 6/22 with EF 40-45%, moderate RV dysfunction.   - HF meds/diuretics currently on hold for hypotension and AKI. Received IVFs.  - Volume status stable.   -Continue midodrine 15 mg tid.  - also ? Amyloid w/ BiV failure R>L, conduction disease and hypotension. Will check Multiple myeloma panel and urine immunofixation (in process) No cMRI w/ AKI. Can obtain PYP scan as outpatient.   6. PAF - In NSR.  - on Coumadin PTA w/ h/o CVA - covering w/ heparin gtt    6. Bioprosthetic Mitral Valve - Stable function on echo in 6/22  Length of Stay: 5  Amy Clegg, NP  03/11/2021, 12:41 PM  Advanced Heart Failure Team Pager [phone] (M-F; [place])  Please contact [facility] for night-coverage after hours (5p -7a ) and weekends on [email or web address]  Feels better. BP and renal function improved.   General:  Well appearing. No resp difficulty HEENT: normal Neck: supple. no JVD. Carotids 2+ bilat; no bruits. No lymphadenopathy or thryomegaly appreciated. Cor: PMI nondisplaced. Regular rate & rhythm. No rubs, gallops or murmurs. Lungs: clear Abdomen: soft, nontender, nondistended. No hepatosplenomegaly. No bruits or masses. Good bowel sounds. Extremities: no cyanosis, clubbing, rash, mild edema RLE with wounds  Neuro: alert & orientedx3, cranial nerves grossly  intact. moves all 4 extremities w/o difficulty. Affect pleasant  BP improved with midodrine. Renal function improving. May be able to drop midodrine down some tomorrow.  Glori Bickers, MD  5:11 PM

## 2021-03-11 NOTE — Care Management Important Message (Signed)
Important Message  Patient Details  Name: Ryan [name] MRN: [id number] Date of Birth: [birth year]   Medicare Important Message Given:  Yes     Orbie Pyo 03/11/2021, 3:14 PM

## 2021-03-11 NOTE — Progress Notes (Signed)
Occupational Therapy Treatment Patient Details Name: Ryan [name] MRN: [id number] DOB: 1945/11/18 Today's Date: 03/11/2021    History of present illness Pt is a 75 y/o male presenting for evaluation of non healing wound on 03/06/21. Pt underwent R above knee popliteal artery-peroneal bypass 6/17.  PMH includes: endocarditis s/p bioprosthetic mitral valve replacement in 2014, atrial fibrillation/flutter, HTN, DM2, PAD, CKD stage III, chronic systolic heart failure, embolic CVA, anticoagulated with coumadin   OT comments  Pt progressing gradually towards goals, able to take pivotal steps to recliner with RW and Mod A x 2. Dizziness and pain remain limiting though BP remained stable after initial drop sitting EOB. Pt able to complete brushing teeth standing at sink at Mod A with increasing assist to maintain balance due to kyphotic posture secondary to fatigue. Pt left up in chair with needs within reach, lunch setup and UE HEP w/ theraband to address in next OT session.   BP at rest: 112/62 (78) BP sitting EOB: 84/48 (59) Standing: 85/57 (68) After initial transfer: 86/50 (60) At end of session: 124/66 (83)   Follow Up Recommendations  SNF    Equipment Recommendations  3 in 1 bedside commode    Recommendations for Other Services      Precautions / Restrictions Precautions Precautions: Fall;Other (comment) Precaution Comments: watch orthostatic BP, hx of limited R LE dorsiflexion Restrictions Weight Bearing Restrictions: No       Mobility Bed Mobility Overal bed mobility: Needs Assistance Bed Mobility: Supine to Sit     Supine to sit: Mod assist;+2 for physical assistance     General bed mobility comments: Requiring cues for sequencing with assist for legs, trunk, and to scoot forward.  Increased time for all and pt stiff during movement    Transfers Overall transfer level: Needs assistance Equipment used: Rolling walker (2 wheeled) Transfers: Sit to/from Stand Sit  to Stand: Mod assist;+2 physical assistance;From elevated surface         General transfer comment: Mod A x 2 for power up from bedside with cues for hand placement. Pt able to demo taking pivotal steps to recliner with assist to manuever RW, maintain balance due to fatigue and kyphotic posture, and cues for sequencing LEs    Balance Overall balance assessment: Needs assistance Sitting-balance support: Feet supported;No upper extremity supported Sitting balance-Leahy Scale: Fair     Standing balance support: Bilateral upper extremity supported;During functional activity Standing balance-Leahy Scale: Poor Standing balance comment: Requiring RW and assist from therapist                           ADL either performed or assessed with clinical judgement   ADL Overall ADL's : Needs assistance/impaired Eating/Feeding: Set up;Sitting Eating/Feeding Details (indicate cue type and reason): assist to open containers Grooming: Moderate assistance;Standing;Oral care Grooming Details (indicate cue type and reason): Mod A overall. Pt reliant on one UE support standing at sink, so OT assisted with bimanual tasks (opening toothpaste, placing on toothbrush). With prolonged standing, pt posture became more flexed and fatigued requiring increased assist in standing             Lower Body Dressing: Total assistance;Bed level Lower Body Dressing Details (indicate cue type and reason): Total A to don socks in bed Toilet Transfer: Moderate assistance;+2 for physical assistance;+2 for safety/equipment;Stand-pivot;RW Toilet Transfer Details (indicate cue type and reason): simulated to recliner           General ADL Comments:  BP continues to be low with activity though stable with standing/transfer. Pt continues to be limited by pain and dizziness but able to demo taking steps and standing at sink for ADLs wtih assist     Vision   Vision Assessment?: No apparent visual deficits    Perception     Praxis      Cognition Arousal/Alertness: Awake/alert Behavior During Therapy: Flat affect Overall Cognitive Status: Within Functional Limits for tasks assessed                                 General Comments: some slower processing but [facility]        Exercises     Shoulder Instructions       General Comments Provided UE HEP handout and green theraband to trial/educate on next session    Pertinent Vitals/ Pain       Pain Assessment: Faces Faces Pain Scale: Hurts little more Pain Location: R LE Pain Descriptors / Indicators: Discomfort;Grimacing;Guarding Pain Intervention(s): Monitored during session;Limited activity within patient's tolerance  Home Living                                          Prior Functioning/Environment              Frequency  Min 2X/week        Progress Toward Goals  OT Goals(current goals can now be found in the care plan section)  Progress towards OT goals: Progressing toward goals  Acute Rehab OT Goals Patient Stated Goal: get stronger and go home OT Goal Formulation: With patient Time For Goal Achievement: 03/23/21 Potential to Achieve Goals: Good ADL Goals Pt Will Perform Lower Body Bathing: sitting/lateral leans;sit to/from stand;with mod assist Pt Will Perform Lower Body Dressing: with mod assist;sitting/lateral leans;sit to/from stand Pt Will Transfer to Toilet: with mod assist;stand pivot transfer;bedside commode Pt Will Perform Toileting - Clothing Manipulation and hygiene: with mod assist;sitting/lateral leans;sit to/from stand  Plan Discharge plan remains appropriate    Co-evaluation                 AM-PAC OT "6 Clicks" Daily Activity     Outcome Measure   Help from another person eating meals?: A Little Help from another person taking care of personal grooming?: A Lot (standing) Help from another person toileting, which includes using toliet, bedpan, or  urinal?: Total Help from another person bathing (including washing, rinsing, drying)?: A Lot Help from another person to put on and taking off regular upper body clothing?: A Little Help from another person to put on and taking off regular lower body clothing?: Total 6 Click Score: 12    End of Session Equipment Utilized During Treatment: Gait belt;Rolling walker  OT Visit Diagnosis: Unsteadiness on feet (R26.81);Other abnormalities of gait and mobility (R26.89);Muscle weakness (generalized) (M62.81);Pain Pain - Right/Left: Right Pain - part of body: Leg   Activity Tolerance Patient tolerated treatment well   Patient Left in chair;with call bell/phone within reach   Nurse Communication Mobility status;Other (comment) (BP)        Time: [id number] OT Time Calculation (min): 40 min  Charges: OT General Charges $OT Visit: 1 Visit OT Treatments $Self Care/Home Management : 8-22 mins $Therapeutic Activity: 23-37 mins  Malachy Chamber, OTR/L Acute Rehab Services Office: [phone]    Almyra Free  Jenene Kauffmann 03/11/2021, 2:24 PM

## 2021-03-11 NOTE — Progress Notes (Signed)
Mobility Specialist: Progress Note   03/11/21 1339  Orthostatic Lying   BP- Lying 112/62  Orthostatic Sitting  BP- Sitting (!) 84/48  Orthostatic Standing at 0 minutes  BP- Standing at 0 minutes (!) 85/57  Orthostatic Standing at 3 minutes  BP- Standing at 3 minutes (!) 80/50  Mobility  Activity Transferred:  Bed to chair  Level of Assistance +2 (takes two people)  Games developer wheel walker  Distance Ambulated (ft) 4 ft  Mobility Out of bed to chair with meals  Mobility Response Tolerated fair  Mobility performed by Mobility specialist;Other (comment) (OT Almyra Free)  Bed Position Chair  $Mobility charge 1 Mobility   Pre-Mobility: 79 HR, 112/62 BP, 100% SpO2 Post-Mobility: 90 HR, 80/50 BP, 100% SpO2  Pt was minA-modA to sit EOB and required +2 assistance to stand at RW. Orthostatic vitals done throughout session. Pt was assisted to recliner with c/o dizziness but said it did not progress worse after standing. OT Almyra Free still in the room.   [facility] Jaydis Duchene Mobility Specialist Mobility Specialist Phone: [phone]

## 2021-03-11 NOTE — Progress Notes (Signed)
[facility] ROUNDING NOTE   Subjective:   Interval History: This is a 75 year old man with history of mitral valve replacement 2014, ardiomyopathy, Atrial fibrillation and congestive heart disease who has severe peripheral vascular disease and underwent successful right fem/pop bypass 03/06/21.   He has known chronic kidney disease with baseline creatinine of 1.7 mg/dl   Post operative he has been oliguric despite IV fluids  and has a positive fluid balance of 5 L         He appears comfortable this morning with no complaints  Blood pressure 89/50 pulse 79 temperature 97.6 O2 sats 90% room air   Sodium 133 potassium 5 chloride 105 CO2 23 BUN 33 creatinine 1.7 glucose 114 calcium 8.3 creatinine 2.2 hemoglobin 8.1   Urine output 1000 cc.  Status post transfusion 03/09/2021     Objective:  Vital signs in last 24 hours:  Temp:  [97.6 F (36.4 C)-98.3 F (36.8 C)] 97.6 F (36.4 C) (06/22 0743) Pulse Rate:  [66-96] 79 (06/22 0743) Resp:  [14-20] 20 (06/22 0743) BP: (88-100)/(49-59) 89/50 (06/22 0743) SpO2:  [98 %-100 %] 98 % (06/22 0743) Weight:  [76.7 kg] 76.7 kg (06/22 0552)  Weight change: -0.2 kg Filed Weights   03/08/21 0940 03/10/21 0300 03/11/21 0552  Weight: 76.4 kg 76.9 kg 76.7 kg    Intake/Output: I/O last 3 completed shifts: In: 1182.1 [P.O.:360; I.V.:822.1] Out: [id number]   Intake/Output this shift:  No intake/output data recorded.   Comfortable alert nondistressed CVS-  regular rate and rhythm no murmurs rubs or gallops RS- CTA no wheeze or rales ABD- BS present soft non-distended EXT-right leg incision clean and dry   Basic Metabolic Panel: Recent Labs  Lab 03/07/21 0000 03/08/21 1147 03/09/21 0241 03/10/21 0111 03/11/21 0107  NA 140 136 134* 134* 133*  K 4.7 4.8 4.8 4.8 5.0  CL 113* 109 107 105 105  CO2 21* 19* 20* 19* 23  GLUCOSE 145* 135* 117* 111* 114*  BUN 29* 36* 36* 36* 33*  CREATININE 1.74* 2.37* 2.33* 2.02*  1.87*  CALCIUM 7.9* 8.0* 8.1* 8.2* 8.3*  PHOS  --   --   --  3.3 2.7     Liver Function Tests: Recent Labs  Lab 03/10/21 0111 03/11/21 0107  ALBUMIN 2.2* 2.2*    No results for input(s): LIPASE, AMYLASE in the last 168 hours. No results for input(s): AMMONIA in the last 168 hours.  CBC: Recent Labs  Lab 03/07/21 1808 03/08/21 0156 03/09/21 0241 03/10/21 2126 03/11/21 0107  WBC 10.7* 10.8* 8.9 8.1 7.8  HGB 9.1* 8.5* 7.4* 7.7* 8.1*  HCT 29.3* 26.6* 23.8* 24.2* 25.4*  MCV 82.8 80.6 82.4 79.1* 78.9*  PLT 91* 80* 83* 115* 110*     Cardiac Enzymes: No results for input(s): CKTOTAL, CKMB, CKMBINDEX, TROPONINI in the last 168 hours.  BNP: Invalid input(s): POCBNP  CBG: Recent Labs  Lab 03/06/21 1628 03/06/21 2105 03/07/21 0610 03/07/21 1142 03/07/21 1706  GLUCAP 111* 109* 145* 145* 127*     Microbiology: Results for orders placed or performed during the hospital encounter of 03/03/21  Surgical pcr screen     Status: None   Collection Time: 03/03/21  3:37 PM   Specimen: Nasal Mucosa; Nasal Swab  Result Value Ref Range Status   MRSA, PCR NEGATIVE NEGATIVE Final   Staphylococcus aureus NEGATIVE NEGATIVE Final    Comment: (NOTE) The Xpert SA Assay (FDA approved for NASAL specimens in patients 15 years of age  and older), is one component of a comprehensive surveillance program. It is not intended to diagnose infection nor to guide or monitor treatment. Performed at [facility], [place] [address], [place], [place] [address]   SARS CORONAVIRUS 2 (TAT 6-24 HRS) Nasopharyngeal Nasopharyngeal Swab     Status: None   Collection Time: 03/03/21  3:38 PM   Specimen: Nasopharyngeal Swab  Result Value Ref Range Status   SARS Coronavirus 2 NEGATIVE NEGATIVE Final    Comment: (NOTE) SARS-CoV-2 target nucleic acids are NOT DETECTED.  The SARS-CoV-2 RNA is generally detectable in upper and lower respiratory specimens during the acute phase of infection.  Negative results do not preclude SARS-CoV-2 infection, do not rule out co-infections with other pathogens, and should not be used as the sole basis for treatment or other patient management decisions. Negative results must be combined with clinical observations, patient history, and epidemiological information. The expected result is Negative.  Fact Sheet for Patients: SugarRoll.be  Fact Sheet for Healthcare Providers: https://www.woods-mathews.com/  This test is not yet approved or cleared by the [place] FDA and  has been authorized for detection and/or diagnosis of SARS-CoV-2 by FDA under an Emergency Use Authorization (EUA). This EUA will remain  in effect (meaning this test can be used) for the duration of the COVID-19 declaration under Se ction 564(b)(1) of the Act, 21 U.S.C. section 360bbb-3(b)(1), unless the authorization is terminated or revoked sooner.  Performed at [facility], [place] [address] [place], [place] [address]     Coagulation Studies: Recent Labs    03/09/21 1556 03/10/21 0111 03/11/21 0107  LABPROT 16.7* 15.7* 16.1*  INR 1.4* 1.3* 1.3*     Urinalysis: Recent Labs    03/09/21 1311  COLORURINE YELLOW  LABSPEC 1.011  PHURINE 5.0  GLUCOSEU >=500*  HGBUR SMALL*  BILIRUBINUR NEGATIVE  KETONESUR NEGATIVE  PROTEINUR NEGATIVE  NITRITE NEGATIVE  LEUKOCYTESUR NEGATIVE       Imaging: US RENAL  Result Date: 03/09/2021 CLINICAL DATA:  Acute renal failure EXAM: RENAL / URINARY TRACT ULTRASOUND COMPLETE COMPARISON:  None FINDINGS: Right Kidney: Renal measurements: 10.6 x 4.5 x 4.7 cm. = volume: 115 mL. Echogenicity within normal limits. No mass or hydronephrosis visualized. Left Kidney: Renal measurements: 11.0 x 4.4 x 4.6 cm. = volume: 117 mL. Echogenicity within normal limits. No mass or hydronephrosis visualized. Bladder: Appears normal for degree of bladder distention. Other: None. IMPRESSION:  Unremarkable renal ultrasound. Electronically Signed   By: Inez Catalina M.D.   On: 03/09/2021 21:15     Medications:    sodium chloride Stopped (03/07/21 [id number])   heparin 1,200 Units/hr (03/11/21 0857)   magnesium sulfate bolus IVPB      amiodarone  200 mg Oral Daily   atorvastatin  40 mg Oral q1800   docusate sodium  100 mg Oral Daily   feeding supplement  237 mL Oral BID BM   gabapentin  300 mg Oral QHS   midodrine  10 mg Oral TID WC   pantoprazole  40 mg Oral Daily   tamsulosin  0.4 mg Oral Daily   warfarin  5 mg Oral Daily   Warfarin - Physician Dosing Inpatient   Does not apply q1600   acetaminophen **OR** acetaminophen, alum & mag hydroxide-simeth, bisacodyl, guaiFENesin-dextromethorphan, hydrALAZINE, labetalol, magnesium sulfate bolus IVPB, metoprolol tartrate, morphine injection, ondansetron, oxyCODONE-acetaminophen, phenol, polyethylene glycol, potassium chloride  Assessment/ Plan:   Acute Kidney injury  oliguric also some low blood pressures  strongly suspect that this  is secondary to post op ATN.  Renal ultrasound unremarkable.  Urinalysis bland.  Will need to avoid nephrotoxins and IV contrast. Monitor I's and o's closely. Daily renal panel. No nephrotoxins on board at this time.  Creatinine appears to be improving HTN/vol appears stable.  Continues to diurese well without diuretics Anemia LDH 225 iron saturations low 15% B12 1128.  Status post transfusion Afib. MVR  Systolic heart failure  agree with cardoiology consult Dm   jardiance on hold  - agree Will sign off patient at this point seems to be little to add.  Please reconsult if needed    LOS: [place] '@TODAY''@11'$ :28 AM

## 2021-03-11 NOTE — Progress Notes (Addendum)
VASCULAR SURGERY ASSESSMENT & PLAN:   5 Days Post-Op  PAD; atherosclerosis of native arteries of right lower extremity causing ulceration. S/p right pop-to peroneal bypass with vein. RLE well perfused.  Ambulation limited secondary to orthostatic BP. Saline wash right 1-2 toe webspace and place gauze in between.  Acute BLA: Received 3 units PRBCs. Hgb this morning is 8.1 g/dL No evidence of ongoing blood loss.    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 Cardiology following and managing hypotension. Coumadin 5 mg daily. INR = 1.3 Heparin now at therapeutic rate. Will consult pharmacy for Coumadin dosing. Platelet count 110k.   Recent right and left heart cath (May 2022) without evidence of CAD.   History of CKD stage 3: elevated Cr = 1.87 >>much improved. Voiding  spontaneously. UOP 1075 cc. Nephrology following.  Dispo: SNF  SUBJECTIVE:   Denies CP or SOB. OOB to chair. Confused on whether it is morning or afternoon.  PHYSICAL EXAM:   Vitals:   03/10/21 2200 03/10/21 2321 03/11/21 0322 03/11/21 0552  BP: (!) 88/49 (!) 95/54 (!) 98/59   Pulse: 77 72 66   Resp: '16 16 15   '$ Temp:  98.3 F (36.8 C) 98.1 F (36.7 C)   TempSrc:  Oral Oral   SpO2: 100% 100% 100%   Weight:    76.7 kg  Height:       General appearance: Awake, alert in no apparent distress Cardiac: Heart rate and rhythm are regular Respirations: Nonlabored. Lungs CTAB Incisions: Right thigh and lower leg incisions are all well approximated. Extremities: Right foot is warm with intact sensation. Mild edema. He is unable to dorsiflex right ankle but is moving toes. Calf is soft. Ischemic changes noted and unchanged of right great and second toes. Pulse/Doppler exam: Brisk biphasic Doppler signal in graft and brisk right dorsalis pedis, posterior tibial and peroneal artery Doppler signals. + left AT signal.    LABS:   Lab Results  Component Value Date    WBC 7.8 03/11/2021   HGB 8.1 (L) 03/11/2021   HCT 25.4 (L) 03/11/2021   MCV 78.9 (L) 03/11/2021   PLT 110 (L) 03/11/2021   Lab Results  Component Value Date   CREATININE 1.87 (H) 03/11/2021   Lab Results  Component Value Date   INR 1.3 (H) 03/11/2021   CBG (last 3)  No results for input(s): GLUCAP in the last 72 hours.  PROBLEM LIST:    Active Problems:   Popliteal artery occlusion, right (HCC)   PAD (peripheral artery disease) (HCC)   CURRENT MEDS:    amiodarone  200 mg Oral Daily   atorvastatin  40 mg Oral q1800   docusate sodium  100 mg Oral Daily   feeding supplement  237 mL Oral BID BM   gabapentin  300 mg Oral QHS   midodrine  10 mg Oral TID WC   pantoprazole  40 mg Oral Daily   tamsulosin  0.4 mg Oral Daily   warfarin  5 mg Oral Daily   Warfarin - Physician Dosing Inpatient   Does not apply [place], PA-C  Office: [phone] 03/11/2021   VASCULAR STAFF ADDENDUM: I have independently interviewed and examined the patient. I agree with the above.  Continues to improve. Mobilizing more. Renal function improving. H/H stable. Approaching next level of care - may be ready tomorrow for SNF.  [name]. [name], MD [facility]  Phone Number: [phone] 03/11/2021 3:00 PM

## 2021-03-11 NOTE — Plan of Care (Signed)

## 2021-03-11 NOTE — Progress Notes (Signed)
ANTICOAGULATION CONSULT NOTE - Follow Up Consult  Pharmacy Consult for heparin Indication: atrial fibrillation   Labs: Recent Labs    03/09/21 0241 03/09/21 1556 03/10/21 0111 03/10/21 2126 03/11/21 0107  HGB 7.4*  --   --  7.7* 8.1*  HCT 23.8*  --   --  24.2* 25.4*  PLT 83*  --   --  115* 110*  APTT 68*  --  93*  --  80*  LABPROT  --  16.7* 15.7*  --  16.1*  INR  --  1.4* 1.3*  --  1.3*  HEPARINUNFRC 0.28* 0.30 0.33  --  0.27*  CREATININE 2.33*  --  2.02*  --  1.87*    Assessment: 75yo male subtherapeutic on heparin after two levels at lower end of goal; no gtt issues or signs of bleeding per RN.  Goal of Therapy:  Heparin level 0.3-0.7 units/ml   Plan:  Will increase heparin gtt by 10% to 1200 units/hr and check level in 8 hours.    Wynona Neat, PharmD, BCPS  03/11/2021,3:01 AM

## 2021-03-11 NOTE — TOC Progression Note (Addendum)
Transition of Care (TOC) - Progression Note  Heart Failure   Patient Details  Name: Ryan [name] MRN: [id number] Date of Birth: 09-20-1946  Transition of Care [facility]) CM/SW [place], [place] Phone Number: 03/11/2021, 9:18 AM  Clinical Narrative:    CSW received a call from [facility] indicating they received insurance authorization back for 5 days the next review is for 03/16/21.   CSW spoke with [name] at [facility] who reported that they do not need a COVID test for the patient and that the patient will need to discharge tomorrow as she won't be able to hold the bed any longer as she has several others needing a bed for rehab.   CSW spoke with the patient at bedside regarding the plan for discharge when the doctor states he is medically ready. [name] reported that if possible he would like his sister to take him to the SNF but he is open to the CSW arranging transport for him as well. CSW obtained persmission to talk with the patients sister, and daughter-in-law and patient agreed. CSW spoke with the patients sister, [name] [phone] who reported she is on her way to visit her brother her in the hospital and that she doesn't feel comfortable transporting him to the facility if he cannot walk and would prefer the CSW to arrange transportation for the patient. [name] daughter-in-law, [name] was in agreement and that due to his physical limitations transportation should be arranged and they are discussing putting in place a MPOA in the near future. CSW will arrange for transportation once the doctor indicates the patient is medically ready for d/c.  CSW will continue to follow throughout discharge.   Expected Discharge Plan: [place] Barriers to Discharge: Continued Medical Work up  Expected Discharge Plan and Services Expected Discharge Plan: [place] In-house Referral: Clinical Social Work Discharge Planning Services: CM Consult    Living arrangements for the past 2 months: Single Family Home                                       Social Determinants of Health (SDOH) Interventions    Readmission Risk Interventions No flowsheet data found.  Marris Frontera, MSW, [place] Heart Failure Social Worker

## 2021-03-11 NOTE — Progress Notes (Signed)
ANTICOAGULATION CONSULT NOTE - Consult  Pharmacy Consult for heparin/coumadin Indication: atrial fibrillation  Allergies  Allergen Reactions   Tuna [Fish Allergy] Nausea And Vomiting    Patient Measurements: Height: 6' (182.9 cm) Weight: 76.7 kg (169 lb 1.5 oz) IBW/kg (Calculated) : 77.6 Heparin Dosing Weight: 70.2  Vital Signs: Temp: 97.6 F (36.4 C) (06/22 1224) Temp Source: Oral (06/22 1224) BP: 128/70 (06/22 1224) Pulse Rate: 79 (06/22 0743)  Labs: Recent Labs    03/09/21 0241 03/09/21 1556 03/10/21 0111 03/10/21 2126 03/11/21 0107 03/11/21 1113  HGB 7.4*  --   --  7.7* 8.1*  --   HCT 23.8*  --   --  24.2* 25.4*  --   PLT 83*  --   --  115* 110*  --   APTT 68*  --  93*  --  80*  --   LABPROT  --  16.7* 15.7*  --  16.1*  --   INR  --  1.4* 1.3*  --  1.3*  --   HEPARINUNFRC 0.28* 0.30 0.33  --  0.27* 0.43  CREATININE 2.33*  --  2.02*  --  1.87*  --      Estimated Creatinine Clearance: 35.9 mL/min (A) (by C-G formula based on SCr of 1.87 mg/dL (H)).   Medical History: Past Medical History:  Diagnosis Date   AICD (automatic cardioverter/defibrillator) present    Arthritis    Atrial fibrillation ([place])    Cardiomyopathy, dilated (HCC)    CHF (congestive heart failure) ([place])    Diabetes mellitus without complication ([place])    Endocarditis    Hypertension    Peripheral vascular disease ([place])    PVC's (premature ventricular contractions)    SVT (supraventricular tachycardia)  long RP     Medications:  Infusions:   sodium chloride Stopped (03/07/21 0621)   heparin 1,200 Units/hr (03/11/21 0857)   magnesium sulfate bolus IVPB      Assessment: Patient with atrial fibrillation/flutter, bioprosthetic MVR 2014 on warfarin. Warfarin held for procedures PTA and started on Lovenox 80 mg BID (Last dose 6/16 @ 1300) > heparin gtt for surgery. Was on low dose heparin while being transfused 6/18. Moved to therapeutic dosing by MD and monitored with APTTs. Pharmacy  consulted for heparin management.  Of note, recent right and left heart cath (May 2022) without evidence of CAD  Heparin is therapeutic. INR is still at 1.3 today. [facility] wants Rx to take over dosing of coumadin. We will increase dose slightly. He is on interacting drug amio.  PTA warf dose '5mg'$  qTTSS and 2.5 mg qMWF  Goal of Therapy:  INR 2-3 Heparin level 0.3-0.7 units/ml Monitor platelets by anticoagulation protocol: Yes   Plan:  Continue IV heparin at 1200 units/hr Daily heparin level and CBC. Warfarin 7.'5mg'$  PO x1 F/u INR  Onnie Boer, PharmD, BCIDP, AAHIVP, CPP Infectious Disease Pharmacist 03/11/2021 2:26 PM

## 2021-03-11 NOTE — Progress Notes (Signed)
Patient will be discharging to [facility] and was notified they do not need covid screen before admission. Was notified by MD to disregard covid screen order.  Daymon Larsen, RN

## 2021-03-12 DIAGNOSIS — D62 Acute posthemorrhagic anemia: Secondary | ICD-10-CM | POA: Diagnosis not present

## 2021-03-12 DIAGNOSIS — I70245 Atherosclerosis of native arteries of left leg with ulceration of other part of foot: Secondary | ICD-10-CM | POA: Diagnosis not present

## 2021-03-12 DIAGNOSIS — I82409 Acute embolism and thrombosis of unspecified deep veins of unspecified lower extremity: Secondary | ICD-10-CM | POA: Diagnosis not present

## 2021-03-12 DIAGNOSIS — M7989 Other specified soft tissue disorders: Secondary | ICD-10-CM | POA: Diagnosis not present

## 2021-03-12 DIAGNOSIS — M79652 Pain in left thigh: Secondary | ICD-10-CM | POA: Diagnosis not present

## 2021-03-12 DIAGNOSIS — N179 Acute kidney failure, unspecified: Secondary | ICD-10-CM | POA: Diagnosis not present

## 2021-03-12 DIAGNOSIS — Z7401 Bed confinement status: Secondary | ICD-10-CM | POA: Diagnosis not present

## 2021-03-12 DIAGNOSIS — L97519 Non-pressure chronic ulcer of other part of right foot with unspecified severity: Secondary | ICD-10-CM | POA: Diagnosis not present

## 2021-03-12 DIAGNOSIS — R5381 Other malaise: Secondary | ICD-10-CM | POA: Diagnosis not present

## 2021-03-12 DIAGNOSIS — I70229 Atherosclerosis of native arteries of extremities with rest pain, unspecified extremity: Secondary | ICD-10-CM | POA: Diagnosis not present

## 2021-03-12 DIAGNOSIS — M86171 Other acute osteomyelitis, right ankle and foot: Secondary | ICD-10-CM | POA: Diagnosis not present

## 2021-03-12 DIAGNOSIS — I5042 Chronic combined systolic (congestive) and diastolic (congestive) heart failure: Secondary | ICD-10-CM | POA: Diagnosis not present

## 2021-03-12 DIAGNOSIS — I420 Dilated cardiomyopathy: Secondary | ICD-10-CM | POA: Diagnosis present

## 2021-03-12 DIAGNOSIS — I130 Hypertensive heart and chronic kidney disease with heart failure and stage 1 through stage 4 chronic kidney disease, or unspecified chronic kidney disease: Secondary | ICD-10-CM | POA: Diagnosis not present

## 2021-03-12 DIAGNOSIS — I10 Essential (primary) hypertension: Secondary | ICD-10-CM | POA: Diagnosis not present

## 2021-03-12 DIAGNOSIS — L97419 Non-pressure chronic ulcer of right heel and midfoot with unspecified severity: Secondary | ICD-10-CM | POA: Diagnosis present

## 2021-03-12 DIAGNOSIS — I5022 Chronic systolic (congestive) heart failure: Secondary | ICD-10-CM | POA: Diagnosis not present

## 2021-03-12 DIAGNOSIS — D638 Anemia in other chronic diseases classified elsewhere: Secondary | ICD-10-CM | POA: Diagnosis not present

## 2021-03-12 DIAGNOSIS — J1282 Pneumonia due to coronavirus disease 2019: Secondary | ICD-10-CM | POA: Diagnosis not present

## 2021-03-12 DIAGNOSIS — I779 Disorder of arteries and arterioles, unspecified: Secondary | ICD-10-CM | POA: Diagnosis not present

## 2021-03-12 DIAGNOSIS — Z79899 Other long term (current) drug therapy: Secondary | ICD-10-CM | POA: Diagnosis not present

## 2021-03-12 DIAGNOSIS — I480 Paroxysmal atrial fibrillation: Secondary | ICD-10-CM | POA: Diagnosis not present

## 2021-03-12 DIAGNOSIS — Z7901 Long term (current) use of anticoagulants: Secondary | ICD-10-CM | POA: Diagnosis not present

## 2021-03-12 DIAGNOSIS — R262 Difficulty in walking, not elsewhere classified: Secondary | ICD-10-CM | POA: Diagnosis not present

## 2021-03-12 DIAGNOSIS — M1990 Unspecified osteoarthritis, unspecified site: Secondary | ICD-10-CM | POA: Diagnosis not present

## 2021-03-12 DIAGNOSIS — E1169 Type 2 diabetes mellitus with other specified complication: Secondary | ICD-10-CM | POA: Diagnosis present

## 2021-03-12 DIAGNOSIS — R609 Edema, unspecified: Secondary | ICD-10-CM | POA: Diagnosis not present

## 2021-03-12 DIAGNOSIS — R791 Abnormal coagulation profile: Secondary | ICD-10-CM | POA: Diagnosis not present

## 2021-03-12 DIAGNOSIS — Z515 Encounter for palliative care: Secondary | ICD-10-CM | POA: Diagnosis not present

## 2021-03-12 DIAGNOSIS — Z8249 Family history of ischemic heart disease and other diseases of the circulatory system: Secondary | ICD-10-CM | POA: Diagnosis not present

## 2021-03-12 DIAGNOSIS — U071 COVID-19: Secondary | ICD-10-CM | POA: Diagnosis not present

## 2021-03-12 DIAGNOSIS — E1122 Type 2 diabetes mellitus with diabetic chronic kidney disease: Secondary | ICD-10-CM | POA: Diagnosis not present

## 2021-03-12 DIAGNOSIS — K661 Hemoperitoneum: Secondary | ICD-10-CM | POA: Diagnosis not present

## 2021-03-12 DIAGNOSIS — G8918 Other acute postprocedural pain: Secondary | ICD-10-CM | POA: Diagnosis not present

## 2021-03-12 DIAGNOSIS — Z953 Presence of xenogenic heart valve: Secondary | ICD-10-CM | POA: Diagnosis not present

## 2021-03-12 DIAGNOSIS — Z8673 Personal history of transient ischemic attack (TIA), and cerebral infarction without residual deficits: Secondary | ICD-10-CM | POA: Diagnosis not present

## 2021-03-12 DIAGNOSIS — I82412 Acute embolism and thrombosis of left femoral vein: Secondary | ICD-10-CM | POA: Diagnosis not present

## 2021-03-12 DIAGNOSIS — N1832 Chronic kidney disease, stage 3b: Secondary | ICD-10-CM | POA: Diagnosis not present

## 2021-03-12 DIAGNOSIS — N185 Chronic kidney disease, stage 5: Secondary | ICD-10-CM | POA: Diagnosis not present

## 2021-03-12 DIAGNOSIS — I77810 Thoracic aortic ectasia: Secondary | ICD-10-CM | POA: Diagnosis present

## 2021-03-12 DIAGNOSIS — I120 Hypertensive chronic kidney disease with stage 5 chronic kidney disease or end stage renal disease: Secondary | ICD-10-CM | POA: Diagnosis not present

## 2021-03-12 DIAGNOSIS — E1140 Type 2 diabetes mellitus with diabetic neuropathy, unspecified: Secondary | ICD-10-CM | POA: Diagnosis present

## 2021-03-12 DIAGNOSIS — R531 Weakness: Secondary | ICD-10-CM | POA: Diagnosis not present

## 2021-03-12 DIAGNOSIS — E11621 Type 2 diabetes mellitus with foot ulcer: Secondary | ICD-10-CM | POA: Diagnosis present

## 2021-03-12 DIAGNOSIS — L97529 Non-pressure chronic ulcer of other part of left foot with unspecified severity: Secondary | ICD-10-CM | POA: Diagnosis not present

## 2021-03-12 DIAGNOSIS — I9589 Other hypotension: Secondary | ICD-10-CM | POA: Diagnosis not present

## 2021-03-12 DIAGNOSIS — R0902 Hypoxemia: Secondary | ICD-10-CM | POA: Diagnosis not present

## 2021-03-12 DIAGNOSIS — N183 Chronic kidney disease, stage 3 unspecified: Secondary | ICD-10-CM | POA: Diagnosis not present

## 2021-03-12 DIAGNOSIS — I96 Gangrene, not elsewhere classified: Secondary | ICD-10-CM | POA: Diagnosis present

## 2021-03-12 DIAGNOSIS — I2699 Other pulmonary embolism without acute cor pulmonale: Secondary | ICD-10-CM | POA: Diagnosis not present

## 2021-03-12 DIAGNOSIS — Z95810 Presence of automatic (implantable) cardiac defibrillator: Secondary | ICD-10-CM | POA: Diagnosis not present

## 2021-03-12 DIAGNOSIS — I70261 Atherosclerosis of native arteries of extremities with gangrene, right leg: Secondary | ICD-10-CM | POA: Diagnosis present

## 2021-03-12 DIAGNOSIS — J159 Unspecified bacterial pneumonia: Secondary | ICD-10-CM | POA: Diagnosis not present

## 2021-03-12 DIAGNOSIS — E119 Type 2 diabetes mellitus without complications: Secondary | ICD-10-CM | POA: Diagnosis not present

## 2021-03-12 DIAGNOSIS — I5082 Biventricular heart failure: Secondary | ICD-10-CM | POA: Diagnosis not present

## 2021-03-12 DIAGNOSIS — R110 Nausea: Secondary | ICD-10-CM | POA: Diagnosis not present

## 2021-03-12 DIAGNOSIS — Z9889 Other specified postprocedural states: Secondary | ICD-10-CM | POA: Diagnosis not present

## 2021-03-12 DIAGNOSIS — E1152 Type 2 diabetes mellitus with diabetic peripheral angiopathy with gangrene: Secondary | ICD-10-CM | POA: Diagnosis not present

## 2021-03-12 DIAGNOSIS — I429 Cardiomyopathy, unspecified: Secondary | ICD-10-CM | POA: Diagnosis not present

## 2021-03-12 DIAGNOSIS — D649 Anemia, unspecified: Secondary | ICD-10-CM | POA: Diagnosis not present

## 2021-03-12 DIAGNOSIS — Z7189 Other specified counseling: Secondary | ICD-10-CM | POA: Diagnosis not present

## 2021-03-12 DIAGNOSIS — I739 Peripheral vascular disease, unspecified: Secondary | ICD-10-CM | POA: Diagnosis not present

## 2021-03-12 DIAGNOSIS — D696 Thrombocytopenia, unspecified: Secondary | ICD-10-CM | POA: Diagnosis not present

## 2021-03-12 DIAGNOSIS — I4891 Unspecified atrial fibrillation: Secondary | ICD-10-CM | POA: Diagnosis not present

## 2021-03-12 DIAGNOSIS — J9601 Acute respiratory failure with hypoxia: Secondary | ICD-10-CM | POA: Diagnosis not present

## 2021-03-12 DIAGNOSIS — R630 Anorexia: Secondary | ICD-10-CM | POA: Diagnosis not present

## 2021-03-12 DIAGNOSIS — N170 Acute kidney failure with tubular necrosis: Secondary | ICD-10-CM | POA: Diagnosis not present

## 2021-03-12 LAB — TYPE AND SCREEN
ABO/RH(D): A POS
ABO/RH(D): A POS
Antibody Screen: NEGATIVE
Antibody Screen: NEGATIVE
Unit division: 0
Unit division: 0
Unit division: 0
Unit division: 0
Unit division: 0

## 2021-03-12 LAB — BPAM RBC
Blood Product Expiration Date: 202207082359
Blood Product Expiration Date: 202207082359
Blood Product Expiration Date: 202206262359
Blood Product Expiration Date: 202207082359
Blood Product Expiration Date: 202207082359
ISSUE DATE / TIME: 202206201153
ISSUE DATE / TIME: 202206221149
ISSUE DATE / TIME: 202206180803
ISSUE DATE / TIME: 202206181242
ISSUE DATE / TIME: 202206220218
Unit Type and Rh: 6200
Unit Type and Rh: 6200
Unit Type and Rh: 6200
Unit Type and Rh: 6200
Unit Type and Rh: 6200

## 2021-03-12 LAB — HEPARIN LEVEL (UNFRACTIONATED): Heparin Unfractionated: 0.39 [IU]/mL (ref 0.30–0.70)

## 2021-03-12 LAB — PREPARE RBC (CROSSMATCH)

## 2021-03-12 LAB — CBC
HCT: 24.6 % — ABNORMAL LOW (ref 39.0–52.0)
Hemoglobin: 7.7 g/dL — ABNORMAL LOW (ref 13.0–17.0)
MCH: 25 pg — ABNORMAL LOW (ref 26.0–34.0)
MCHC: 31.3 g/dL (ref 30.0–36.0)
MCV: 79.9 fL — ABNORMAL LOW (ref 80.0–100.0)
Platelets: 120 K/uL — ABNORMAL LOW (ref 150–400)
RBC: 3.08 MIL/uL — ABNORMAL LOW (ref 4.22–5.81)
RDW: 19 % — ABNORMAL HIGH (ref 11.5–15.5)
WBC: 7.5 K/uL (ref 4.0–10.5)
nRBC: 0 % (ref 0.0–0.2)

## 2021-03-12 LAB — RENAL FUNCTION PANEL
Albumin: 2.1 g/dL — ABNORMAL LOW (ref 3.5–5.0)
Anion gap: 5 (ref 5–15)
BUN: 34 mg/dL — ABNORMAL HIGH (ref 8–23)
CO2: 23 mmol/L (ref 22–32)
Calcium: 8.4 mg/dL — ABNORMAL LOW (ref 8.9–10.3)
Chloride: 106 mmol/L (ref 98–111)
Creatinine, Ser: 1.76 mg/dL — ABNORMAL HIGH (ref 0.61–1.24)
GFR, Estimated: 39 mL/min — ABNORMAL LOW
Glucose, Bld: 108 mg/dL — ABNORMAL HIGH (ref 70–99)
Phosphorus: 2.9 mg/dL (ref 2.5–4.6)
Potassium: 5.3 mmol/L — ABNORMAL HIGH (ref 3.5–5.1)
Sodium: 134 mmol/L — ABNORMAL LOW (ref 135–145)

## 2021-03-12 LAB — PROTIME-INR
INR: 1.5 — ABNORMAL HIGH (ref 0.8–1.2)
Prothrombin Time: 18.2 s — ABNORMAL HIGH (ref 11.4–15.2)

## 2021-03-12 MED ORDER — WARFARIN SODIUM 7.5 MG PO TABS
7.5000 mg | ORAL_TABLET | ORAL | Status: AC
  Administered 2021-03-12: 7.5 mg via ORAL
  Filled 2021-03-12: qty 1

## 2021-03-12 MED ORDER — WARFARIN SODIUM 7.5 MG PO TABS: 7.5000 mg | ORAL_TABLET | Freq: Every day | ORAL | Status: DC

## 2021-03-12 MED ORDER — ENOXAPARIN SODIUM 80 MG/0.8ML IJ SOSY
80.0000 mg | PREFILLED_SYRINGE | Freq: Two times a day (BID) | INTRAMUSCULAR | Status: DC
  Administered 2021-03-12: 80 mg via SUBCUTANEOUS
  Filled 2021-03-12: qty 0.8

## 2021-03-12 MED ORDER — HYDROCODONE-ACETAMINOPHEN 5-325 MG PO TABS: 1.0000 | ORAL_TABLET | Freq: Two times a day (BID) | ORAL | 0 refills | Status: DC | PRN

## 2021-03-12 MED ORDER — ENSURE ENLIVE PO LIQD: 237.0000 mL | Freq: Two times a day (BID) | ORAL | 12 refills | Status: DC

## 2021-03-12 MED ORDER — MIDODRINE HCL 10 MG PO TABS: 10.0000 mg | ORAL_TABLET | Freq: Three times a day (TID) | ORAL | Status: DC

## 2021-03-12 MED ORDER — ENOXAPARIN SODIUM 80 MG/0.8ML IJ SOSY: 80.0000 mg | PREFILLED_SYRINGE | Freq: Once | INTRAMUSCULAR | Status: DC

## 2021-03-12 NOTE — Progress Notes (Signed)
Mobility Specialist - Progress Note   03/12/21 1504  Mobility  Activity Stood at bedside  Level of Assistance Moderate assist, patient does 50-74%  Assistive Device Front wheel walker  Distance Ambulated (ft) 2 ft  Mobility Ambulated with assistance in room  Mobility Response Tolerated fair  Mobility performed by Mobility specialist  $Mobility charge 1 Mobility   Supine, pre-mobility: 85 HR, 99/49 BP Sitting: 88 HR, 101/53 BP Standing: 93 HR, 84/40 BP Sitting, post-mobility: 89 HR, 77/39 BP Supine, post-mobility: 89 HR, 124/55 BP  Pt mod assist for bed mobility and to stand from bedside. He endorsed dizziness when sitting up and standing as well as R knee pain when sidestepping at bedside. He endorsed improvement of his dizziness once back in bed. Pt back into bed w/ call bell at side.   Pricilla Handler Mobility Specialist Mobility Specialist Phone: [phone]

## 2021-03-12 NOTE — Progress Notes (Addendum)
   VASCULAR SURGERY ASSESSMENT & PLAN:   6 Days Post-Op  PAD; atherosclerosis of native arteries of right lower extremity causing ulceration. S/p right pop-to peroneal bypass with vein. RLE well perfused.  Ambulation limited secondary to orthostatic BP.    Acute BLA: Received 3 units PRBCs. Hgb this morning is 7.7 g/dL No evidence of ongoing blood loss.   History of CHF, frequent PVCs, cardiomyopathy and bioprosthetic MVR, atrila fibrillation/flutter on coumadin as outpatient. Cardiology following and managing hypotension. On midodrine PO. Coumadin daily; dose per pharmacy. INR = 1.5 Heparin at therapeutic rate.  Platelet count 120k.   Recent right and left heart cath (May 2022) without evidence of CAD.   History of CKD stage 3: elevated Cr = 1.76>>much improved and now at baseline.  UOP 600 cc yesterday; has voided 800 cc this morning. Nephrology has signed off.   Dispo: SNF. He has a bed available. Will need cardiology to assess readiness for discharge. SUBJECTIVE:   Still feels dizzy when ambulating.  Vitals:   03/11/21 2021 03/11/21 2343 03/12/21 0000 03/12/21 0303  BP: (!) 105/57 (!) 88/61 102/61 (!) 104/56  Pulse: 83 80 66 73  Resp: '16 13 18 14  '$ Temp: 97.9 F (36.6 C) 97.9 F (36.6 C) 98 F (36.7 C) 98 F (36.7 C)  TempSrc: Oral Oral Oral Oral  SpO2: 100% 98% 100% 100%  Weight:    77.3 kg  Height:       General appearance: Awake, alert in no apparent distress Cardiac: Heart rate and rhythm are regular Respirations: Nonlabored. Lungs CTAB Incisions: Right thigh and lower leg incisions are all well approximated. Extremities: Right foot is warm with intact sensation. Mild edema. He is unable to dorsiflex right ankle but is moving toes. Calf is soft. Ischemic changes noted and unchanged of right great and second toes. Pulse/Doppler exam: Brisk biphasic Doppler signal in graft and brisk right dorsalis pedis, posterior tibial and peroneal artery Doppler signals. + left AT  signal.   LABS:   Lab Results  Component Value Date   WBC 7.5 03/12/2021   HGB 7.7 (L) 03/12/2021   HCT 24.6 (L) 03/12/2021   MCV 79.9 (L) 03/12/2021   PLT 120 (L) 03/12/2021   Lab Results  Component Value Date   CREATININE 1.76 (H) 03/12/2021   Lab Results  Component Value Date   INR 1.5 (H) 03/12/2021   CBG (last 3)  No results for input(s): GLUCAP in the last 72 hours.  PROBLEM LIST:    Active Problems:   Popliteal artery occlusion, right (HCC)   PAD (peripheral artery disease) (HCC)   CURRENT MEDS:    amiodarone  200 mg Oral Daily   atorvastatin  40 mg Oral q1800   docusate sodium  100 mg Oral Daily   feeding supplement  237 mL Oral BID BM   gabapentin  300 mg Oral QHS   midodrine  10 mg Oral TID WC   pantoprazole  40 mg Oral Daily   tamsulosin  0.4 mg Oral Daily   Warfarin - Pharmacist Dosing Inpatient   Does not apply [place] Office: [phone] 03/12/2021

## 2021-03-12 NOTE — Discharge Summary (Addendum)
Discharge Summary     Ryan [name] [birth year] 75 y.o. 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 male who presents for evaluation of non healing wound.  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  He also had a right great toe ulceration that he was able to heal with wound care around that time.             He now has a GT tip ulcer and a second toe open wound.  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               He is followed by Cardiology with history of history of endocarditis s/p bioprosthetic mitral valve replacement in 2014, atrial fibrillation/flutter, HTN, DM2, PAD (followed by vascular) chronic systolic heart failure, embolic CVA, anticoagulated with coumadin.  He has CKD stage III  SCr down to 1.74 day of d/c (down from peak of 2.4).  Hospital Course:  The patient was admitted to the hospital and taken to the operating room on 03/06/2021 and underwent: 1) right greater saphenous vein harvest 2) right popliteal to peroneal bypass with ipsilateral, translocated, subcutaneous, non-reversed greater saphenous vein    Findings: Adequate greater saphenous vein for conduit. Circumferentially calcified popliteal artery. Circumferentially calcified peroneal artery. Surgical control of arteries extremely difficult. Tourniquet not effective. Popliteal required double clamping. Peroneal required double clamping. Completion angiogram shows no technical problem at proximal or distal anastomosis. Palpable pulse mid calf marked with an "X" Strongly biphasic doppler flow in peroneal  The pt tolerated the procedure well and was transported to the PACU in good condition.  By POD 1, his RLE was well  perfused.  He did have a soft BP.  He had acute surgical blood loss anemia and was transfused 2 units of PRBC's.  He has hx of CHF and CKD.  POD 2, he was restarted on his coumadin and bridged with heparin.    POD 3, cardiology was consulted.  Patient with post-op ATN in setting of hypotension. Continue to hold all HF meds. Increase midodrine to 10 tid. He appears volume replete. Likely doesn't need much more fluid.If unable to keep MAP > 70 will need pressors. Follow daily BMET. We will follow.   POD 4, PRN pain control. PT / OT / OOB / Ambulate. BP improved overnight with midodrine. Holding antihypertensives. Continue heparin transition to Coumadin for MVR. History of HFrEF (40%). Pulmonary hygiene: IS / OOB. Diet as tolerated. Start supplements. Check prealbumin. AKI on CKD III. Improving today. Check CBC this AM. Continue therapeutic anticoagulation.  POD 5, PAD; atherosclerosis of native arteries of right lower extremity causing ulceration. S/p right pop-to peroneal bypass with vein. RLE well perfused.  Ambulation limited secondary to orthostatic BP. Saline wash right 1-2 toe webspace and place gauze in between.   Acute BLA: Received 3 units PRBCs. Hgb this morning is 8.1 g/dL No evidence of ongoing blood loss.   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 Cardiology following and managing hypotension. Coumadin 5 mg daily. INR = 1.3 Heparin now at therapeutic rate. Will consult pharmacy for Coumadin dosing. Platelet count 110k.   Recent right and left heart cath (May 2022) without evidence  of CAD.   History of CKD stage 3: elevated Cr = 1.87 >>much improved. Voiding spontaneously. UOP 1075 cc. Nephrology following.   Dispo: SNF  POD 6, midodrine dosed at '10mg'$  tid.  His toprol, lasix, spironolactone were not restarted this hospitalization.  His coumadin has been dosed and he will be bridged with therapeutic lovenox.  He received a dose  of coumadin and Lovenox prior to discharge on 03/12/2021.   He will continue his amiodarone '200mg'$  daily.    CBC    Component Value Date/Time   WBC 7.5 03/12/2021 0123   RBC 3.08 (L) 03/12/2021 0123   HGB 7.7 (L) 03/12/2021 0123   HGB 13.4 09/22/2020 1446   HCT 24.6 (L) 03/12/2021 0123   HCT 40.5 09/22/2020 1446   PLT 120 (L) 03/12/2021 0123   PLT 372 09/22/2020 1446   MCV 79.9 (L) 03/12/2021 0123   MCV 69 (L) 09/22/2020 1446   MCH 25.0 (L) 03/12/2021 0123   MCHC 31.3 03/12/2021 0123   RDW 19.0 (H) 03/12/2021 0123   RDW 19.7 (H) 09/22/2020 1446   LYMPHSABS 1,363 06/01/2016 1414   MONOABS 517 06/01/2016 1414   EOSABS 141 06/01/2016 1414   BASOSABS 47 06/01/2016 1414    BMET    Component Value Date/Time   NA 134 (L) 03/12/2021 0123   NA 140 01/26/2021 1342   K 5.3 (H) 03/12/2021 0123   CL 106 03/12/2021 0123   CO2 23 03/12/2021 0123   GLUCOSE 108 (H) 03/12/2021 0123   BUN 34 (H) 03/12/2021 0123   BUN 37 (H) 01/26/2021 1342   CREATININE 1.76 (H) 03/12/2021 0123   CREATININE 1.17 06/01/2016 1414   CALCIUM 8.4 (L) 03/12/2021 0123   GFRNONAA 39 (L) 03/12/2021 0123   GFRAA 39 (L) 10/01/2020 1157     Discharge Instructions     Discharge patient   Complete by: As directed    Discharge disposition: 03-Skilled Nursing Facility   Discharge patient date: 03/12/2021       Discharge Diagnosis:  Popliteal artery occlusion, right ([place]) [I70.201] PAD (peripheral artery disease) ([place]) [I73.9]  Secondary Diagnosis: Patient Active Problem List   Diagnosis Date Noted   Popliteal artery occlusion, right ([place]) 03/06/2021   PAD (peripheral artery disease) ([place]) 03/06/2021   Acute on chronic combined systolic and diastolic CHF (congestive heart failure) ([place]) 09/06/2020   CHF (congestive heart failure) ([place]) 08/26/2020   Acute combined systolic and diastolic CHF, NYHA class 3 ([place])    Stage 3 chronic kidney disease ([place])    ICD (implantable cardioverter-defibrillator) in place  01/12/2017   PVC's (premature ventricular contractions) 06/12/2016   Junctional tachycardia ([place]) 09/16/2015   SVT (supraventricular tachycardia)  long RP    Atherosclerosis of native arteries of the extremities with ulceration ([place]) 10/23/2012   Essential hypertension 09/29/2012    Class: Chronic   Diabetes mellitus ([place]) 09/28/2012   Status post mitral valve replacement with bioprosthetic valve 09/19/2012    Class: Acute   Acute pericarditis 09/16/2012    Class: Acute   Chronic systolic heart failure ([place]) 09/16/2012    Class: Acute   Peripheral vascular disease ([place]) 09/15/2012   Acute ischemic stroke ([place]) 09/15/2012   Past Medical History:  Diagnosis Date   AICD (automatic cardioverter/defibrillator) present    Arthritis    Atrial fibrillation (HCC)    Cardiomyopathy, dilated (HCC)    CHF (congestive heart failure) ([place])    Diabetes mellitus without complication ([place])    Endocarditis    Hypertension  Peripheral vascular disease (HCC)    PVC's (premature ventricular contractions)    SVT (supraventricular tachycardia)  long RP      Allergies as of 03/12/2021       Reactions   Tuna [fish Allergy] Nausea And Vomiting        Medication List     STOP taking these medications    Entresto 24-26 MG Generic drug: sacubitril-valsartan   furosemide 40 MG tablet Commonly known as: LASIX   metoprolol succinate 25 MG 24 hr tablet Commonly known as: TOPROL-XL   spironolactone 25 MG tablet Commonly known as: ALDACTONE   tamsulosin 0.4 MG Caps capsule Commonly known as: FLOMAX       TAKE these medications    acetaminophen 325 MG tablet Commonly known as: TYLENOL Take 650 mg by mouth every 6 (six) hours as needed for mild pain or headache.   amiodarone 200 MG tablet Commonly known as: PACERONE TAKE 1 TABLET BY MOUTH EVERY DAY   atorvastatin 40 MG tablet Commonly known as: LIPITOR Take 1 tablet (40 mg total) by mouth daily at 6 PM.   CENTRUM SILVER  PO Take 1 tablet by mouth 3 (three) times a week.   empagliflozin 10 MG Tabs tablet Commonly known as: JARDIANCE Take 1 tablet (10 mg total) by mouth daily.   enoxaparin 80 MG/0.8ML injection Commonly known as: LOVENOX Inject '80mg'$  into the belly every 12 hours. Last dose prior to procedure is 6/16 @ 9AM.   feeding supplement Liqd Take 237 mLs by mouth 2 (two) times daily between meals.   gabapentin 300 MG capsule Commonly known as: NEURONTIN Take 300 mg by mouth at bedtime.   HYDROcodone-acetaminophen 5-325 MG tablet Commonly known as: NORCO/VICODIN Take 1 tablet by mouth 2 (two) times daily as needed for moderate pain.   midodrine 10 MG tablet Commonly known as: PROAMATINE Take 1 tablet (10 mg total) by mouth 3 (three) times daily with meals.   mupirocin ointment 2 % Commonly known as: BACTROBAN Apply 1 application topically 2 (two) times daily.   pantoprazole 40 MG tablet Commonly known as: PROTONIX Take 40 mg by mouth daily.   triamcinolone cream 0.5 % Commonly known as: KENALOG Apply 1 application topically daily as needed (skin rash).   warfarin 2.5 MG tablet Commonly known as: Coumadin Take 2.'5mg'$  ( 1 tablet ) Monday, wednesday and Friday  Other days take '5mg'$  (2 tablets)        Discharge Instructions: [facility] Discharge instructions Lower Extremity Bypass Surgery  Please refer to the following instruction for your post-procedure care. Your surgeon or physician assistant will discuss any changes with you.  Activity  You are encouraged to walk as much as you can. You can slowly return to normal activities during the month after your surgery. Avoid strenuous activity and heavy lifting until your doctor tells you it's OK. Avoid activities such as vacuuming or swinging a golf club. Do not drive until your doctor give the OK and you are no longer taking prescription pain medications. It is also normal to have difficulty with  sleep habits, eating and bowel movement after surgery. These will go away with time.  Bathing/Showering  You may shower after you go home. Do not soak in a bathtub, hot tub, or swim until the incision heals completely.  Incision Care  Clean your incision with mild soap and water. Shower every day. Pat the area dry with a clean towel. You do not need a bandage  unless otherwise instructed. Do not apply any ointments or creams to your incision. If you have open wounds you will be instructed how to care for them or a visiting nurse may be arranged for you. If you have staples or sutures along your incision they will be removed at your post-op appointment. You may have skin glue on your incision. Do not peel it off. It will come off on its own in about one week.  Wash the groin wound with soap and water daily and pat dry. (No tub bath-only shower)  Then put a dry gauze or washcloth in the groin to keep this area dry to help prevent wound infection.  Do this daily and as needed.  Do not use Vaseline or neosporin on your incisions.  Only use soap and water on your incisions and then protect and keep dry.  Diet  Resume your normal diet. There are no special food restrictions following this procedure. A low fat/ low cholesterol diet is recommended for all patients with vascular disease. In order to heal from your surgery, it is CRITICAL to get adequate nutrition. Your body requires vitamins, minerals, and protein. Vegetables are the best source of vitamins and minerals. Vegetables also provide the perfect balance of protein. Processed food has little nutritional value, so try to avoid this.  Medications  Resume taking all your medications unless your doctor or Physician Assistant tells you not to. If your incision is causing pain, you may take over-the-counter pain relievers such as acetaminophen (Tylenol). If you were prescribed a stronger pain medication, please aware these medication can cause nausea and  constipation. Prevent nausea by taking the medication with a snack or meal. Avoid constipation by drinking plenty of fluids and eating foods with high amount of fiber, such as fruits, vegetables, and grains. Take Colace 100 mg (an over-the-counter stool softener) twice a day as needed for constipation.  Do not take Tylenol if you are taking prescription pain medications.  --Patient taking coumadin and is being bridged with Lovenox.   --PLEASE DISCONTINUE LOVENOX WHEN INR IS THERAPEUTIC BETWEEN 2-3.   --PATIENT DID RECEIVE DOSE OF COUMADIN ON 03/12/2021 SO PLEASE START COUMADIN ON 03/13/2021. --PT RECEIVED DOSE OF LOVENOX PRIOR TO DISCHARGE ON 03/12/2021.  PLEASE START LOVENOX ON 03/13/2021.  PLEASE CHECK INR ON 03/13/2021 AND PER PROTOCOL THEREAFTER.   Follow Up  Our office will schedule a follow up appointment 2-3 weeks following discharge.  Please call us immediately for any of the following conditions  Severe or worsening pain in your legs or feet while at rest or while walking Increase pain, redness, warmth, or drainage (pus) from your incision site(s) Fever of 101 degree or higher The swelling in your leg with the bypass suddenly worsens and becomes more painful than when you were in the hospital If you have been instructed to feel your graft pulse then you should do so every day. If you can no longer feel this pulse, call the office immediately. Not all patients are given this instruction.  Leg swelling is common after leg bypass surgery.  The swelling should improve over a few months following surgery. To improve the swelling, you may elevate your legs above the level of your heart while you are sitting or resting. Your surgeon or physician assistant may ask you to apply an ACE wrap or wear compression (TED) stockings to help to reduce swelling.  Reduce your risk of vascular disease  Stop smoking. If you would like help call [facility] at  1-800-QUIT-NOW (864)742-7303) or [place]  at [phone].  Manage your cholesterol Maintain a desired weight Control your diabetes weight Control your diabetes Keep your blood pressure down  If you have any questions, please call the office at [phone]   Prescriptions given: 1.  Norco #20 No Refill (pt receives Norco #60 monthly) given he is going to SNF, Rx given at discharge.  He will have to resume his pain management regimen upon discharge. 2.  Midodrine '10mg'$  tid. (No rx given)  Disposition: SNF  Patient's condition: is Fair  Follow up: 1. Dr. [name] in 2 weeks   Leontine Locket, PA-C [facility] [phone] 03/12/2021  2:14 PM  - For VQI Registry use ---   Post-op:  Wound infection: No  Graft infection: No  Transfusion: Yes    If yes, 3 units given New Arrhythmia: No Ipsilateral amputation: No, '[ ]'$  Minor, '[ ]'$  BKA, '[ ]'$  AKA Discharge patency: Valu.[place]ieves ] Primary, '[ ]'$  Primary assisted, '[ ]'$  Secondary, '[ ]'$  Occluded Patency judged by: Valu.Nieves ] Dopper only, '[ ]'$  Palpable graft pulse, '[]'$  Palpable distal pulse, '[ ]'$  ABI inc. > 0.15, '[ ]'$  Duplex Discharge ABI: R not done, L  D/C Ambulatory Status: Ambulatory with Assistance  Complications: MI: No, '[ ]'$  Troponin only, '[ ]'$  EKG or Clinical CHF: Yes Resp failure:No, '[ ]'$  Pneumonia, '[ ]'$  Ventilator Chg in renal function: No, '[ ]'$  Inc. Cr > 0.5, '[ ]'$  Temp. Dialysis,  '[ ]'$  Permanent dialysis Stroke: No, '[ ]'$  Minor, '[ ]'$  Major Return to OR: No  Reason for return to OR: '[ ]'$  Bleeding, '[ ]'$  Infection, '[ ]'$  Thrombosis, '[ ]'$  Revision  Discharge medications: Statin use:  yes ASA use:  no Plavix use:  no Beta blocker use: no CCB use:  No ACEI use:   no ARB use:  no Coumadin use: yes

## 2021-03-12 NOTE — Progress Notes (Addendum)
Called to give report  to [facility], RN did not answer, message was left with name and # to call back.   Chrisandra Carota, RN 03/12/2021 3:53 PM    Report was called in '@1700'$ , there is no time constraint for when the patient has to be there.  Chrisandra Carota, RN 03/12/2021 5:01 PM

## 2021-03-12 NOTE — Progress Notes (Addendum)
ANTICOAGULATION CONSULT NOTE - Consult  Pharmacy Consult for heparin/coumadin Indication: atrial fibrillation  Allergies  Allergen Reactions   Tuna [Fish Allergy] Nausea And Vomiting    Patient Measurements: Height: 6' (182.9 cm) Weight: 77.3 kg (170 lb 6.7 oz) IBW/kg (Calculated) : 77.6 Heparin Dosing Weight: 70.2  Vital Signs: Temp: 98.2 F (36.8 C) (06/23 1235) Temp Source: Oral (06/23 1235) BP: 110/55 (06/23 1235) Pulse Rate: 80 (06/23 1235)  Labs: Recent Labs    03/10/21 0111 03/10/21 0111 03/10/21 2126 03/11/21 0107 03/11/21 1113 03/12/21 0123  HGB  --    < > 7.7* 8.1*  --  7.7*  HCT  --   --  24.2* 25.4*  --  24.6*  PLT  --   --  115* 110*  --  120*  APTT 93*  --   --  80*  --   --   LABPROT 15.7*  --   --  16.1*  --  18.2*  INR 1.3*  --   --  1.3*  --  1.5*  HEPARINUNFRC 0.33  --   --  0.27* 0.43 0.39  CREATININE 2.02*  --   --  1.87*  --  1.76*   < > = values in this interval not displayed.     Estimated Creatinine Clearance: 38.4 mL/min (A) (by C-G formula based on SCr of 1.76 mg/dL (H)).   Medical History: Past Medical History:  Diagnosis Date   AICD (automatic cardioverter/defibrillator) present    Arthritis    Atrial fibrillation ([place])    Cardiomyopathy, dilated (H[facility])    CHF (congestive heart failure) ([place])    Diabetes mellitus without complication ([place])    Endocarditis    Hypertension    Peripheral vascular disease ([place])    PVC's (premature ventricular contractions)    SVT (supraventricular tachycardia)  long RP     Medications:  Infusions:   sodium chloride Stopped (03/07/21 0621)   heparin 1,200 Units/hr (03/11/21 0857)   magnesium sulfate bolus IVPB      Assessment: Patient with atrial fibrillation/flutter, bioprosthetic MVR 2014 on warfarin. Warfarin held for procedures PTA and started on Lovenox 80 mg BID (Last dose 6/16 @ 1300) > heparin gtt for surgery. Was on low dose heparin while being transfused 6/18. Moved to therapeutic  dosing by MD and monitored with APTTs. Pharmacy consulted for heparin management.  Of note, recent right and left heart cath (May 2022) without evidence of CAD  Heparin drip 1200 uts/hr Heparin level 0.39 is therapeutic. INR is still < goal but increasing 1.3>1.5.  He is on interacting drug amio as PTA PTA warf dose '5mg'$  qTTSS and 2.5 mg qMWF  Goal of Therapy:  INR 2-3 Heparin level 0.3-0.7 units/ml Monitor platelets by anticoagulation protocol: Yes   Plan:  Continue IV heparin at 1200 units/hr Daily heparin level and CBC. Warfarin 7.'5mg'$  PO again today then resume PTA dose tomorrow at [facility] Plan for bridge of enoxaparin at SNF until INR 2 F/u INR in am    [place].D. CPP, BCPS Clinical Pharmacist [phone] 03/12/2021 1:38 PM

## 2021-03-12 NOTE — TOC Transition Note (Signed)
Transition of Care [facility]) - CM/SW Discharge Note Heart Failure   Patient Details  Name: Ryan [name] MRN: [id number] Date of Birth: 1946/08/24  Transition of Care [facility]) CM/SW Contact:  [place], [place] Phone Number: 03/12/2021, 2:41 PM   Clinical Narrative:   [facility] Anticipated DC date: 03/12/2021 Family notified: Yes Transport by: Corey Harold   Per MD patient ready for DC to SNF at [facility]. RN to call report prior to discharge (RM# [phone]). RN, patient, patient's family, and facility notified of DC. Discharge Summary and FL2 sent to facility. DC packet on chart. Ambulance transport requested for patient.   CSW will sign off for now as social work intervention is no longer needed. Please consult [place] again if new needs arise.       Final next level of care: Skilled Nursing Facility Barriers to Discharge: No Barriers Identified   Patient Goals and CMS Choice Patient states their goals for this hospitalization and ongoing recovery are:: to return home CMS Medicare.gov Compare Post Acute Care list provided to:: Patient Choice offered to / list presented to : Patient  Discharge Placement PASRR number recieved: 03/12/21 Existing PASRR number confirmed : 03/12/21          Patient [facility] bed at: [facility] Patient to be transferred to facility by: [place] Name of family member notified: Fayne Mediate and daughter in law [name] Patient and family notified of of transfer: 03/12/21  Discharge Plan and Services In-house Referral: Clinical Social Work Discharge Planning Services: CM Consult                                 Social Determinants of Health ([place]) Interventions     Readmission Risk Interventions No flowsheet data found.   Zyen Triggs, MSW, [place] Heart Failure Social Worker

## 2021-03-12 NOTE — Progress Notes (Addendum)
Advanced Heart Failure Rounding Note  PCP-Cardiologist: [name], MD  AHF: Dr. Aundra Dubin   Subjective:    6/21 Midodrine increased 15 mg tid.  6/22 Midodrine cut back to 10 mg tid  Complaining of RLE pain when oob. Denies SOB.   Objective:   Weight Range: 77.3 kg Body mass index is 23.11 kg/m.   Vital Signs:   Temp:  [97.9 F (36.6 C)-99 F (37.2 C)] 98.2 F (36.8 C) (06/23 1235) Pulse Rate:  [66-84] 80 (06/23 1235) Resp:  [11-20] 11 (06/23 1235) BP: (88-110)/(54-61) 110/55 (06/23 1235) SpO2:  [98 %-100 %] 100 % (06/23 1235) Weight:  [77.3 kg] 77.3 kg (06/23 0303) Last BM Date: 03/11/21  Weight change: Filed Weights   03/10/21 0300 03/11/21 0552 03/12/21 0303  Weight: 76.9 kg 76.7 kg 77.3 kg    Intake/Output:   Intake/Output Summary (Last 24 hours) at 03/12/2021 1339 Last data filed at 03/12/2021 1236 Gross per 24 hour  Intake 718.05 ml  Output 2000 ml  Net -1281.95 ml      Physical Exam   General:  No resp difficulty HEENT: normal Neck: supple. no JVD. Carotids 2+ bilat; no bruits. No lymphadenopathy or thryomegaly appreciated. Cor: PMI nondisplaced. Regular rate & rhythm. No rubs, gallops or murmurs. Lungs: clear Abdomen: soft, nontender, nondistended. No hepatosplenomegaly. No bruits or masses. Good bowel sounds. Extremities: no cyanosis, clubbing, rash, RLE 1+ with intact incision.  Neuro: alert & orientedx3, cranial nerves grossly intact. moves all 4 extremities w/o difficulty. Affect pleasant   Telemetry   NSR 60-80s personally reviewed.   EKG    No new EKG to review   Labs    CBC Recent Labs    03/11/21 0107 03/12/21 0123  WBC 7.8 7.5  HGB 8.1* 7.7*  HCT 25.4* 24.6*  MCV 78.9* 79.9*  PLT 110* 696*   Basic Metabolic Panel Recent Labs    03/11/21 0107 03/12/21 0123  NA 133* 134*  K 5.0 5.3*  CL 105 106  CO2 23 23  GLUCOSE 114* 108*  BUN 33* 34*  CREATININE 1.87* 1.76*  CALCIUM 8.3* 8.4*  PHOS 2.7 2.9   Liver  Function Tests Recent Labs    03/11/21 0107 03/12/21 0123  ALBUMIN 2.2* 2.1*   No results for input(s): LIPASE, AMYLASE in the last 72 hours. Cardiac Enzymes No results for input(s): CKTOTAL, CKMB, CKMBINDEX, TROPONINI in the last 72 hours.  BNP: BNP (last 3 results) Recent Labs    08/26/20 0622  BNP 2,235.1*    ProBNP (last 3 results) Recent Labs    08/25/20 1121  PROBNP 21,395*     D-Dimer No results for input(s): DDIMER in the last 72 hours. Hemoglobin A1C No results for input(s): HGBA1C in the last 72 hours. Fasting Lipid Panel No results for input(s): CHOL, HDL, LDLCALC, TRIG, CHOLHDL, LDLDIRECT in the last 72 hours. Thyroid Function Tests No results for input(s): TSH, T4TOTAL, T3FREE, THYROIDAB in the last 72 hours.  Invalid input(s): FREET3  Other results:   Imaging    No results found.   Medications:     Scheduled Medications:  amiodarone  200 mg Oral Daily   atorvastatin  40 mg Oral q1800   docusate sodium  100 mg Oral Daily   feeding supplement  237 mL Oral BID BM   gabapentin  300 mg Oral QHS   midodrine  10 mg Oral TID WC   pantoprazole  40 mg Oral Daily   tamsulosin  0.4  mg Oral Daily   warfarin  7.5 mg Oral q1600   Warfarin - Pharmacist Dosing Inpatient   Does not apply q1600    Infusions:  sodium chloride Stopped (03/07/21 2353)   heparin 1,200 Units/hr (03/11/21 0857)   magnesium sulfate bolus IVPB      PRN Medications: acetaminophen **OR** acetaminophen, alum & mag hydroxide-simeth, bisacodyl, guaiFENesin-dextromethorphan, hydrALAZINE, labetalol, magnesium sulfate bolus IVPB, metoprolol tartrate, morphine injection, ondansetron, oxyCODONE-acetaminophen, phenol, polyethylene glycol, potassium chloride   Assessment/Plan   PAD - 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 Hypotension  - low volume status, 2/2 anemia requiring  transfusions + poor PO intake - holding HF/BP active meds - MAP goal > 70 -Continue midodrine to 10 tid    3. ABLA - 2/2 surgery  - received total of 3 units post operatively  - Hgb 7.7  - management per [facility]     4. AKI on Stage III CKD - baseline SCr 1.6-1.8.  Gradual increase in SCr, 1.8>>1.9>>2.4> 1.9 >1.76  - suspect post-op ATN 2/2 hypotension - renal US unremarkable  - Continue to hold jardiance.  - continue w/ midodrine for BP support  - nephrology signed off     5. 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  Echo 12/21 showed EF 20-25%, moderate RV enlargement with severely decreased RV systolic function, normally functioning bioprosthetic mitral valve with no significant MR and mean gradient 6, moderate TR, dilated IVC.  Patient had normal EF in 12/13 pre-mitral valve replacement.  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  His last cath was in 1/14 pre-surgery, no significant CAD. On [place] in 12/21, evidence for severe biventricular failure with PCWP and RA pressure > 30 and PAPi 0.8.  Equalization of diastolic pressures concerning for restrictive physiology.  Cardiac output preserved (CI 2.28 Fick, 2.33 thermo).  Moderate HF limitation on 4/22 CPX.  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  Also has history of frequent PVCs.  LHC/RHC in 5/22 showed no significant coronary disease, preserved cardiac output, and low filling pressure.  Echo 6/22 with EF 40-45%, moderate RV dysfunction.   - HF meds/diuretics currently on hold for hypotension and AKI. Received IVFs.  - Volume status stable.  Does not need lasix.  -Continue midodrine 10 mg tid.  - also ? Amyloid w/ BiV failure R>L, conduction disease and hypotension. Will check Multiple myeloma panel and urine immunofixation (in process)  No cMRI w/ AKI. Can obtain PYP scan as outpatient.   6. PAF - In NSR. Continue amio 200 mg daily + coumadin.  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 H/O Embolic CVA   Ok for d/c from HF perspective.  Amio 200 mg daily Midodrine 10 mg three times a day  Hold other HF meds (hold Farxiga, Entrestom, spiro) Can take lasix 82m as needed for edema   Plan for SNF today  Length of Stay: 6  Amy Clegg, NP  03/12/2021, 1:39 PM  Advanced Heart Failure Team Pager [phone](M-F; 7a - 5p)  Please contact [facility] for night-coverage after hours (5p -7a ) and weekends on [email or web address]   Patient  seen and examined with the above-signed Advanced Practice Provider and/or Housestaff. I personally reviewed laboratory data, imaging studies and relevant notes. I independently examined the patient and formulated the important aspects of the plan. I have edited the note to reflect any of my changes or salient points. I have personally discussed the plan with the patient and/or family.  He is improved. Still requiring midodrine for BP support. Ok for d/c to SNF from our standpoint. D/c HF meds as above.   We have arranged f/u in HF Clinic.   Glori Bickers, MD  2:58 PM

## 2021-03-13 LAB — MULTIPLE MYELOMA PANEL, SERUM
Albumin SerPl Elph-Mcnc: 2.7 g/dL — ABNORMAL LOW (ref 2.9–4.4)
Albumin/Glob SerPl: 1.3 (ref 0.7–1.7)
Alpha 1: 0.5 g/dL — ABNORMAL HIGH (ref 0.0–0.4)
Alpha2 Glob SerPl Elph-Mcnc: 0.7 g/dL (ref 0.4–1.0)
B-Globulin SerPl Elph-Mcnc: 0.6 g/dL — ABNORMAL LOW (ref 0.7–1.3)
Gamma Glob SerPl Elph-Mcnc: 0.5 g/dL (ref 0.4–1.8)
Globulin, Total: 2.2 g/dL (ref 2.2–3.9)
IgA: 132 mg/dL (ref 61–437)
IgG (Immunoglobin G), Serum: 465 mg/dL — ABNORMAL LOW (ref 603–1613)
IgM (Immunoglobulin M), Srm: 69 mg/dL (ref 15–143)
Total Protein ELP: 4.9 g/dL — ABNORMAL LOW (ref 6.0–8.5)

## 2021-03-16 ENCOUNTER — Ambulatory Visit (INDEPENDENT_AMBULATORY_CARE_PROVIDER_SITE_OTHER): Payer: Medicare HMO

## 2021-03-16 DIAGNOSIS — R262 Difficulty in walking, not elsewhere classified: Secondary | ICD-10-CM | POA: Diagnosis not present

## 2021-03-16 DIAGNOSIS — Z95810 Presence of automatic (implantable) cardiac defibrillator: Secondary | ICD-10-CM | POA: Diagnosis not present

## 2021-03-16 DIAGNOSIS — I5042 Chronic combined systolic (congestive) and diastolic (congestive) heart failure: Secondary | ICD-10-CM

## 2021-03-16 DIAGNOSIS — I4891 Unspecified atrial fibrillation: Secondary | ICD-10-CM | POA: Diagnosis not present

## 2021-03-16 DIAGNOSIS — G8918 Other acute postprocedural pain: Secondary | ICD-10-CM | POA: Diagnosis not present

## 2021-03-16 DIAGNOSIS — I739 Peripheral vascular disease, unspecified: Secondary | ICD-10-CM | POA: Diagnosis not present

## 2021-03-18 LAB — CUP PACEART REMOTE DEVICE CHECK
Battery Remaining Longevity: 52 mo
Battery Voltage: 2.97 V
Brady Statistic AP VP Percent: 0.03 %
Brady Statistic AP VS Percent: 5.07 %
Brady Statistic AS VP Percent: 0.06 %
Brady Statistic AS VS Percent: 94.84 %
Brady Statistic RA Percent Paced: 5.1 %
Brady Statistic RV Percent Paced: 0.08 %
Date Time Interrogation Session: 20220629132305
HighPow Impedance: 60 Ohm
Implantable Lead Implant Date: 20170922
Implantable Lead Implant Date: 20170922
Implantable Lead Location: 753859
Implantable Lead Location: 753860
Implantable Lead Model: 5076
Implantable Pulse Generator Implant Date: 20170922
Lead Channel Impedance Value: 304 Ohm
Lead Channel Impedance Value: 361 Ohm
Lead Channel Impedance Value: 399 Ohm
Lead Channel Pacing Threshold Amplitude: 0.625 V
Lead Channel Pacing Threshold Amplitude: 0.875 V
Lead Channel Pacing Threshold Pulse Width: 0.4 ms
Lead Channel Pacing Threshold Pulse Width: 0.4 ms
Lead Channel Sensing Intrinsic Amplitude: 1.625 mV
Lead Channel Sensing Intrinsic Amplitude: 1.625 mV
Lead Channel Sensing Intrinsic Amplitude: 6.875 mV
Lead Channel Sensing Intrinsic Amplitude: 6.875 mV
Lead Channel Setting Pacing Amplitude: 2 V
Lead Channel Setting Pacing Amplitude: 2.5 V
Lead Channel Setting Pacing Pulse Width: 0.4 ms
Lead Channel Setting Sensing Sensitivity: 0.3 mV

## 2021-03-20 LAB — CUP PACEART REMOTE DEVICE CHECK
Battery Remaining Longevity: 52 mo
Battery Voltage: 2.97 V
Brady Statistic AP VP Percent: 0.03 %
Brady Statistic AP VS Percent: 5.07 %
Brady Statistic AS VP Percent: 0.06 %
Brady Statistic AS VS Percent: 94.84 %
Brady Statistic RA Percent Paced: 5.1 %
Brady Statistic RV Percent Paced: 0.08 %
Date Time Interrogation Session: 20220629132305
HighPow Impedance: 60 Ohm
Implantable Lead Implant Date: 20170922
Implantable Lead Implant Date: 20170922
Implantable Lead Location: 753859
Implantable Lead Location: 753860
Implantable Lead Model: 5076
Implantable Pulse Generator Implant Date: 20170922
Lead Channel Impedance Value: 304 Ohm
Lead Channel Impedance Value: 361 Ohm
Lead Channel Impedance Value: 399 Ohm
Lead Channel Pacing Threshold Amplitude: 0.625 V
Lead Channel Pacing Threshold Amplitude: 0.875 V
Lead Channel Pacing Threshold Pulse Width: 0.4 ms
Lead Channel Pacing Threshold Pulse Width: 0.4 ms
Lead Channel Sensing Intrinsic Amplitude: 1.625 mV
Lead Channel Sensing Intrinsic Amplitude: 1.625 mV
Lead Channel Sensing Intrinsic Amplitude: 6.875 mV
Lead Channel Sensing Intrinsic Amplitude: 6.875 mV
Lead Channel Setting Pacing Amplitude: 2 V
Lead Channel Setting Pacing Amplitude: 2.5 V
Lead Channel Setting Pacing Pulse Width: 0.4 ms
Lead Channel Setting Sensing Sensitivity: 0.3 mV

## 2021-03-20 NOTE — Progress Notes (Signed)
EPIC Encounter for ICM Monitoring  Patient Name: Ryan [name] is a 75 y.o. male Date: 03/20/2021 Primary Care Physican: [name], MD Primary Cardiologist: [place] Electrophysiologist: [name] 03/02/2021 Weight: 159 lbs    Time in AT/AF   0.0 hr/day (0.0%)                                                             Spoke with patient and reports swelling in both feet.  Pt feels like he has fluid.  Pt had Femoral Artery Bypass on 03/06/2021 and currently in rehab.  Pt seen in Advanced HF clinic 6/13 and per note, REDs Clip was normal.         Optivol thoracic impedance suggesting possible fluid accumulation since 01/26/2021.   Fluid index > normal threshold since 02/03/2021.   Prescribed:  Furosemide 40 mg take 1 tablet by mouth daily as needed for edema Spironolactone 25 mg Take 0.5 tablets (12.5 mg total) by mouth daily.   Labs: 02/25/2021 Creatinine 1.80, BUN 56, Potassium 5.5, Sodium 141 01/26/2021 Creatinine 1.70, BUN 37, Potassium 4.6, Sodium 140, GFR 41 01/22/2021 Creatinine 1.59, BUN 40, Potassium 4.8, Sodium 137  A complete set of results can be found in Results Review.   Recommendations:   Advised to ask rehab nursing staff if they can take the PRN Furosemide for 2-3 days as prescribed at home. He will talk to the nurse today.    Follow-up plan: ICM clinic phone appointment on 03/24/2021 to recheck fluid levels.   91 day device clinic remote transmission 03/16/2021.     EP/Cardiology Office Visits:  03/04/2021 with Dr Aundra Dubin. 04/22/2021 with Richardson Dopp, PA.  05/26/2021 with Dr [name]     Copy of ICM check sent to Dr. [name].     3 month ICM trend: 03/18/2021.    1 Year ICM trend:       Rosalene Billings, RN 03/20/2021 8:44 AM

## 2021-03-20 NOTE — Addendum Note (Signed)
Addended by: Rosalene Billings on: 03/20/2021 09:04 AM   Modules accepted: Level of Service

## 2021-03-24 ENCOUNTER — Telehealth: Payer: Self-pay

## 2021-03-24 ENCOUNTER — Ambulatory Visit (INDEPENDENT_AMBULATORY_CARE_PROVIDER_SITE_OTHER): Payer: Medicare HMO

## 2021-03-24 DIAGNOSIS — I5042 Chronic combined systolic (congestive) and diastolic (congestive) heart failure: Secondary | ICD-10-CM

## 2021-03-24 DIAGNOSIS — Z95810 Presence of automatic (implantable) cardiac defibrillator: Secondary | ICD-10-CM

## 2021-03-24 NOTE — Telephone Encounter (Signed)
Attempted call to [facility] facility in [place].  Left message for patient's nurse to call back with ICM direct number.  Will ask if patient has an order for PRN furosemide and if so, has he received any dosages in the last 3-4 days.

## 2021-03-24 NOTE — Progress Notes (Signed)
EPIC Encounter for ICM Monitoring  Patient Name: Ryan [name] is a 75 y.o. male Date: 03/24/2021 Primary Care Physican: [name], MD Primary Cardiologist: [place] Electrophysiologist: [name] 03/02/2021 Weight: 159 lbs   Time in AT/AF   0.0 hr/day (0.0%)         Spoke with patient and reports he is currently inpatient at [facility] in [place] following Femoral Artery Bypass on 03/06/2021.  Feet slightly swollen but keeping them elevated.          Patient unsure if he is receiving PRN Furosemide and did not ask nursing staff regarding the medication as previously discussed.              Pt seen in Advanced HF clinic 6/13 and per note, REDs Clip was normal.         Optivol thoracic impedance suggesting possible fluid accumulation since 01/26/2021 but trending back to baseline.   Fluid index > normal threshold since 02/03/2021 although REDs Clip was normal on 6/13.   Prescribed:  Furosemide 40 mg take 1 tablet by mouth daily as needed for edema Spironolactone 25 mg Take 0.5 tablets (12.5 mg total) by mouth daily.   Labs: 02/25/2021 Creatinine 1.80, BUN 56, Potassium 5.5, Sodium 141 01/26/2021 Creatinine 1.70, BUN 37, Potassium 4.6, Sodium 140, GFR 41 01/22/2021 Creatinine 1.59, BUN 40, Potassium 4.8, Sodium 137  A complete set of results can be found in Results Review.   Recommendations:   Patient being monitored at inpatient rehab.  Unable to contact [facility] Nurse for update.  Patient has follow up with HF clinic 04/03/2021   Follow-up plan: ICM clinic phone appointment on 04/13/2021 to recheck fluid levels (will have HF clinic OV fluid level recheck on 7/15).   91 day device clinic remote transmission 06/15/2021.     EP/Cardiology Office Visits:  04/03/2021 with HF clinic PA/NP.  04/22/2021 with Richardson Dopp, [place].    05/26/2021 with Dr [name]     Copy of ICM check sent to Dr. [name]      3 month ICM trend: 03/24/2021.    1 Year ICM trend:       Rosalene Billings, RN 03/24/2021 9:51 AM

## 2021-03-26 NOTE — Progress Notes (Signed)
Electrophysiology Office Note Date: 03/27/2021  ID:  Lavar, Patry 1946/06/07, MRN [id number]  PCP: [name], MD Primary Cardiologist: [name], MD Electrophysiologist: Constance Haw, MD   CC: Routine ICD follow-up  TANER MCCLENDON is a 75 y.o. male seen today for Will Meredith Leeds, MD for post hospital follow up.  Since discharge from hospital the patient reports doing well overall from what he can tell. He is not sure about his medications. It appears prior to admission he was only taking lasix prn. Not on his list currently; Most recent HF note says he does not need regularly lasix..  he denies chest pain, palpitations, dyspnea, PND, orthopnea, nausea, vomiting, dizziness, syncope, edema, weight gain, or early satiety. He has not had ICD shocks.   Device History: Medtronic Dual Chamber ICD implanted 05/2016 for NICM   Past Medical History:  Diagnosis Date   AICD (automatic cardioverter/defibrillator) present    Arthritis    Atrial fibrillation (HCC)    Cardiomyopathy, dilated (HCC)    CHF (congestive heart failure) ([place])    Diabetes mellitus without complication ([place])    Endocarditis    Hypertension    Peripheral vascular disease (HCC)    PVC's (premature ventricular contractions)    SVT (supraventricular tachycardia)  long RP    Past Surgical History:  Procedure Laterality Date   ABDOMINAL AORTAGRAM N/A 10/03/2012   Procedure: ABDOMINAL Maxcine Ham;  Surgeon: [name], MD;  Location: [facility] CATH LAB;  Service: Cardiovascular;  Laterality: N/A;   ABDOMINAL AORTOGRAM W/LOWER EXTREMITY N/A 02/25/2021   Procedure: ABDOMINAL AORTOGRAM W/LOWER EXTREMITY;  Surgeon: Cherre Robins, MD;  Location: [place] CV LAB;  Service: Cardiovascular;  Laterality: N/A;   BYPASS GRAFT FEMORAL-PERONEAL Right 03/06/2021   Procedure: RIGHT ABOVE KNEE POPLITEAL ARTERY-PERONEAL BYPASS;  Surgeon: Cherre Robins, MD;  Location: [place];  Service: Vascular;  Laterality:  Right;   CORONARY ANGIOGRAM  09/21/2012   Procedure: CORONARY ANGIOGRAM;  Surgeon: [name], MD;  Location: [facility] CATH LAB;  Service: Cardiovascular;;   EP IMPLANTABLE DEVICE N/A 06/11/2016   Procedure: ICD Implant;  Surgeon: Will Meredith Leeds, MD;  Location: [facility] CV LAB;  Service: Cardiovascular;  Laterality: N/A;   EXTREMITY WIRE/PIN REMOVAL  09/14/2012   Procedure: REMOVAL K-WIRE/PIN EXTREMITY;  Surgeon: [name], MD;  Location: [place];  Service: Orthopedics;  Laterality: Right;  Right Foot   I & D EXTREMITY  09/14/2012   Procedure: IRRIGATION AND DEBRIDEMENT EXTREMITY;  Surgeon: Tennis Must, MD;  Location: [place];  Service: Orthopedics;  Laterality: Right;   INTRAOPERATIVE TRANSESOPHAGEAL ECHOCARDIOGRAM  09/26/2012   Procedure: INTRAOPERATIVE TRANSESOPHAGEAL ECHOCARDIOGRAM;  Surgeon: [name], MD;  Location: [facility] OR;  Service: Open Heart Surgery;  Laterality: N/A;   MITRAL VALVE REPLACEMENT  09/26/2012   Procedure: MITRAL VALVE (MV) REPLACEMENT;  Surgeon: [name], MD;  Location: [place] OR;  Service: Open Heart Surgery;  Laterality: N/A;   RIGHT HEART CATH N/A 08/29/2020   Procedure: RIGHT HEART CATH;  Surgeon: Larey Dresser, MD;  Location: [facility] CV LAB;  Service: Cardiovascular;  Laterality: N/A;   RIGHT HEART CATHETERIZATION  09/21/2012   Procedure: RIGHT HEART CATH;  Surgeon: [name], MD;  Location[facility] CATH LAB;  Service: Cardiovascular;;   RIGHT/LEFT HEART CATH AND CORONARY ANGIOGRAPHY N/A 01/30/2021   Procedure: RIGHT/LEFT HEART CATH AND CORONARY ANGIOGRAPHY;  Surgeon: Larey Dresser, MD;  Location: [facility] INVASIVE CV  LAB;  Service: Cardiovascular;  Laterality: N/A;   SVT ABLATION N/A 09/03/2020   Procedure: SVT ABLATION;  Surgeon: Constance Haw, MD;  Location: [place] CV LAB;  Service: Cardiovascular;  Laterality: N/A;   TEE WITHOUT CARDIOVERSION  09/18/2012   Procedure: TRANSESOPHAGEAL ECHOCARDIOGRAM (TEE);  Surgeon: [name], MD;   Location: [facility] ENDOSCOPY;  Service: Cardiovascular;  Laterality: N/A;    Current Outpatient Medications  Medication Sig Dispense Refill   acetaminophen (TYLENOL) 325 MG tablet Take 650 mg by mouth every 6 (six) hours as needed for mild pain or headache.     amiodarone (PACERONE) 200 MG tablet TAKE 1 TABLET BY MOUTH EVERY DAY 90 tablet 3   atorvastatin (LIPITOR) 40 MG tablet Take 1 tablet (40 mg total) by mouth daily at 6 PM. 30 tablet 5   empagliflozin (JARDIANCE) 10 MG TABS tablet Take 1 tablet (10 mg total) by mouth daily. 90 tablet 3   enoxaparin (LOVENOX) 80 MG/0.8ML injection Inject '80mg'$  into the belly every 12 hours. Last dose prior to procedure is 6/16 @ 9AM. 4 mL 0   feeding supplement (ENSURE ENLIVE / ENSURE PLUS) LIQD Take 237 mLs by mouth 2 (two) times daily between meals. 237 mL 12   furosemide (LASIX) 20 MG tablet Take 20 mg by mouth daily as needed.     gabapentin (NEURONTIN) 300 MG capsule Take 300 mg by mouth at bedtime.     HYDROcodone-acetaminophen (NORCO/VICODIN) 5-325 MG tablet Take 1 tablet by mouth 2 (two) times daily as needed for moderate pain. 20 tablet 0   midodrine (PROAMATINE) 10 MG tablet Take 1 tablet (10 mg total) by mouth 3 (three) times daily with meals.     Multiple Vitamins-Minerals (CENTRUM SILVER PO) Take 1 tablet by mouth 3 (three) times a week.     mupirocin ointment (BACTROBAN) 2 % Apply 1 application topically 2 (two) times daily.     pantoprazole (PROTONIX) 40 MG tablet Take 40 mg by mouth daily.      triamcinolone cream (KENALOG) 0.5 % Apply 1 application topically daily as needed (skin rash).      warfarin (COUMADIN) 2.5 MG tablet Take 2.'5mg'$  ( 1 tablet ) Monday, wednesday and Friday  Other days take '5mg'$  (2 tablets) 44 tablet 1   No current facility-administered medications for this visit.    Allergies:   [name] allergy]   Social History: Social History   Socioeconomic History   Marital status: Divorced    Spouse name: Not on file   Number  of children: 3   Years of education: Not on file   Highest education level: Not on file  Occupational History   Not on file  Tobacco Use   Smoking status: Never   Smokeless tobacco: Never  Vaping Use   Vaping Use: Never used  Substance and Sexual Activity   Alcohol use: No    Alcohol/week: 0.0 standard drinks   Drug use: No   Sexual activity: Not on file  Other Topics Concern   Not on file  Social History Narrative   Daughter lives with him.  Retired Advertising account planner   Social Determinants of Radio broadcast assistant Strain: Not on [employer] Insecurity: No Food Insecurity   Worried About Charity fundraiser in the Last Year: Never true   Arboriculturist in the Last Year: Never true  Transportation Needs: No Data processing manager (Medical): No   Lack of Transportation (Non-Medical):  No  Physical Activity: Not on file  Stress: Not on file  Social Connections: Not on file  Intimate Partner Violence: Not on file    Family History: Family History  Problem Relation Age of Onset   Hypertension Mother    Hypertension Father     Review of Systems: All other systems reviewed and are otherwise negative except as noted above.   Physical Exam: Vitals:   03/27/21 1054  BP: (!) 150/72  Pulse: 72  SpO2: 98%  Weight: 156 lb 3.2 oz (70.9 kg)  Height: 6' (1.829 m)     GEN- The patient is fatigued and chronically ill appearing, alert and oriented x 3 today.   HEENT: normocephalic, atraumatic; sclera clear, conjunctiva pink; hearing intact; oropharynx clear; neck supple, no JVP Lymph- no cervical lymphadenopathy Lungs- Clear to ausculation bilaterally, normal work of breathing.  No wheezes, rales, rhonchi Heart- Regular rate and rhythm, no murmurs, rubs or gallops, PMI not laterally displaced GI- soft, non-tender, non-distended, bowel sounds present, no hepatosplenomegaly Extremities- no clubbing or cyanosis. No edema; DP/PT/radial pulses 2+  bilaterally MS- no significant deformity or atrophy Skin- warm and dry, no rash or lesion; ICD pocket well healed Psych- euthymic mood, full affect Neuro- strength and sensation are intact  ICD interrogation- reviewed in detail today,  See PACEART report  EKG:  EKG is not ordered today.  Recent Labs: 08/25/2020: NT-Pro BNP 21,395 08/26/2020: B Natriuretic Peptide 2,235.1 08/31/2020: Magnesium 1.9 01/22/2021: TSH 2.743 03/03/2021: ALT 46 03/12/2021: BUN 34; Creatinine, Ser 1.76; Hemoglobin 7.7; Platelets 120; Potassium 5.3; Sodium 134   Wt Readings from Last 3 Encounters:  03/27/21 156 lb 3.2 oz (70.9 kg)  03/12/21 170 lb 6.7 oz (77.3 kg)  03/04/21 161 lb (73 kg)     Other studies Reviewed: Additional studies/ records that were reviewed today include: Previous EP and HF notes. Recent admission notes   Assessment and Plan:  1.  Chronic systolic dysfunction s/p Medtronic dual chamber ICD  euvolemic today in discordance with his optivol.  Continue to follow clinically.  Stable on an appropriate medical regimen Normal ICD function See Pace Art report No changes today  2. PAF Continue coumadin for CHA2DS2-VASc of at least 3.  3. PVCs Continue amiodarone. Surveillance labs 01/2021 stable.    Current medicines are reviewed at length with the patient today.   The patient does not have concerns regarding his medicines.  The following changes were made today:  none  Labs/ tests ordered today include:  Orders Placed This Encounter  Procedures   Basic metabolic panel   Pro b natriuretic peptide (BNP)   CBC   CUP PACEART INCLINIC DEVICE CHECK     Disposition:   Follow up with Dr. [name]  6 months   Signed, Shirley Friar, PA-C  03/27/2021 12:42 PM  [address] [address] [phone] (office) [phone] (fax)

## 2021-03-27 ENCOUNTER — Encounter: Payer: Self-pay | Admitting: Student

## 2021-03-27 ENCOUNTER — Other Ambulatory Visit: Payer: Self-pay

## 2021-03-27 ENCOUNTER — Other Ambulatory Visit: Payer: Self-pay | Admitting: *Deleted

## 2021-03-27 ENCOUNTER — Ambulatory Visit (INDEPENDENT_AMBULATORY_CARE_PROVIDER_SITE_OTHER): Payer: Medicare HMO | Admitting: Student

## 2021-03-27 VITALS — BP 150/72 | HR 72 | Ht 72.0 in | Wt 156.2 lb

## 2021-03-27 DIAGNOSIS — I5042 Chronic combined systolic (congestive) and diastolic (congestive) heart failure: Secondary | ICD-10-CM

## 2021-03-27 DIAGNOSIS — I739 Peripheral vascular disease, unspecified: Secondary | ICD-10-CM

## 2021-03-27 DIAGNOSIS — I480 Paroxysmal atrial fibrillation: Secondary | ICD-10-CM | POA: Diagnosis not present

## 2021-03-27 DIAGNOSIS — Z9889 Other specified postprocedural states: Secondary | ICD-10-CM

## 2021-03-27 DIAGNOSIS — Z95810 Presence of automatic (implantable) cardiac defibrillator: Secondary | ICD-10-CM | POA: Diagnosis not present

## 2021-03-27 DIAGNOSIS — I779 Disorder of arteries and arterioles, unspecified: Secondary | ICD-10-CM

## 2021-03-27 LAB — CUP PACEART INCLINIC DEVICE CHECK
Battery Remaining Longevity: 52 mo
Battery Voltage: 2.97 V
Brady Statistic AP VP Percent: 0.03 %
Brady Statistic AP VS Percent: 5.76 %
Brady Statistic AS VP Percent: 0.06 %
Brady Statistic AS VS Percent: 94.15 %
Brady Statistic RA Percent Paced: 5.79 %
Brady Statistic RV Percent Paced: 0.09 %
Date Time Interrogation Session: 20220708123057
HighPow Impedance: 64 Ohm
Implantable Lead Implant Date: 20170922
Implantable Lead Implant Date: 20170922
Implantable Lead Location: 753859
Implantable Lead Location: 753860
Implantable Lead Model: 5076
Implantable Pulse Generator Implant Date: 20170922
Lead Channel Impedance Value: 342 Ohm
Lead Channel Impedance Value: 399 Ohm
Lead Channel Impedance Value: 418 Ohm
Lead Channel Pacing Threshold Amplitude: 0.625 V
Lead Channel Pacing Threshold Amplitude: 0.875 V
Lead Channel Pacing Threshold Pulse Width: 0.4 ms
Lead Channel Pacing Threshold Pulse Width: 0.4 ms
Lead Channel Sensing Intrinsic Amplitude: 1.875 mV
Lead Channel Sensing Intrinsic Amplitude: 10.25 mV
Lead Channel Sensing Intrinsic Amplitude: 4.875 mV
Lead Channel Sensing Intrinsic Amplitude: 8.375 mV
Lead Channel Setting Pacing Amplitude: 2 V
Lead Channel Setting Pacing Amplitude: 2.5 V
Lead Channel Setting Pacing Pulse Width: 0.4 ms
Lead Channel Setting Sensing Sensitivity: 0.3 mV

## 2021-03-27 NOTE — Patient Instructions (Signed)
Medication Instructions:  Your physician recommends that you continue on your current medications as directed. Please refer to the Current Medication list given to you today.  *If you need a refill on your cardiac medications before your next appointment, please call your pharmacy*   Lab Work: TODAY: BMET, BNP, CBC  If you have labs (blood work) drawn today and your tests are completely normal, you will receive your results only by: [place] (if you have MyChart) OR A paper copy in the mail If you have any lab test that is abnormal or we need to change your treatment, we will call you to review the results.   Follow-Up: At [facility], you and your health needs are our priority.  As part of our continuing mission to provide you with exceptional heart care, we have created designated Provider Care Teams.  These Care Teams include your primary Cardiologist (physician) and Advanced Practice Providers (APPs -  Physician Assistants and Nurse Practitioners) who all work together to provide you with the care you need, when you need it.   Your next appointment:   6 month(s)  The format for your next appointment:   In Person  Provider:   You may see Will Meredith Leeds, MD or one of the following Advanced Practice Providers on your designated Care Team:   Tommye Standard, [place] Legrand Como "[facility]" [place], [place]

## 2021-03-28 LAB — CBC
Hematocrit: 30.4 % — ABNORMAL LOW (ref 37.5–51.0)
Hemoglobin: 9.6 g/dL — ABNORMAL LOW (ref 13.0–17.7)
MCH: 24.4 pg — ABNORMAL LOW (ref 26.6–33.0)
MCHC: 31.6 g/dL (ref 31.5–35.7)
MCV: 77 fL — ABNORMAL LOW (ref 79–97)
Platelets: 362 10*3/uL (ref 150–450)
RBC: 3.93 x10E6/uL — ABNORMAL LOW (ref 4.14–5.80)
RDW: 17.2 % — ABNORMAL HIGH (ref 11.6–15.4)
WBC: 7.1 10*3/uL (ref 3.4–10.8)

## 2021-03-28 LAB — BASIC METABOLIC PANEL
BUN/Creatinine Ratio: 12 (ref 10–24)
BUN: 17 mg/dL (ref 8–27)
CO2: 21 mmol/L (ref 20–29)
Calcium: 9.1 mg/dL (ref 8.6–10.2)
Chloride: 106 mmol/L (ref 96–106)
Creatinine, Ser: 1.47 mg/dL — ABNORMAL HIGH (ref 0.76–1.27)
Glucose: 96 mg/dL (ref 65–99)
Potassium: 4.2 mmol/L (ref 3.5–5.2)
Sodium: 142 mmol/L (ref 134–144)
eGFR: 49 mL/min/{1.73_m2} — ABNORMAL LOW (ref >59)

## 2021-03-28 LAB — PRO B NATRIURETIC PEPTIDE: NT-Pro BNP: 3146 pg/mL — ABNORMAL HIGH (ref 0–486)

## 2021-04-01 NOTE — Progress Notes (Addendum)
Advanced Heart Failure Clinic Note   PCP: [name], MD PCP-Cardiologist: [name], MD  EP: Dr. [name] HF Cardiologist: Dr. Aundra Dubin  Reason for Visit: F/u for Systolic Heart Failure  HPI: Ryan [name] is a 75 y.o. 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   He has a history of endocarditis that resulted in mitral valve replacement in 2014. Heart cath prior to that surgery showed widely patent coronaries and moderate to severe pulmonary hypertension.   He was admitted in 12/21 with incessant SVT and CHF.  Echo showed EF 25-30% with severe RV dysfunction.  He initially had poor response to IV diuretics w/ rising SCr. RHC showed evidence for severe biventricular failure with PCWP and RA pressure > 30 and PAPi 0.8. Equalization of diastolic pressures concerning for restrictive physiology.  Cardiac output preserved (CI 2.28 Fick, 2.33 thermo). Was transferred to CCU for lasix gtt and later required addition of milrinone to aid in diuresis. 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  Also had frequent PVCs treated w/ amiodarone. Of note, LHC was not pursued given abnormal renal function. He was diuresed and transitioned to po diuretics. SCr down to 1.74 day of d/c (down from peak of 2.4).   Patient eventually had RHC/LHC in 5/22.  This showed no significant CAD, normal filling pressures, and preserved cardiac output.  He had peripheral angiography and is planned for right SFA => peroneal bypass on 03/06/21 by V[facility].   Had recent echo done in clinic 03/04/21 showing interval improvement in LVEF, up to  40-45%, diffuse hypokinesis, moderately decreased RV systolic function, IVC normal, bioprosthetic mitral valve normal.     Presented 03/06/21 for planned vascular surgery. Underwent right pop-to peroneal bypass. Post op course c/b hypotension and ABLA. SBPs 80s-90s. HF/BP active meds on hold, except for tamsulosin for BPH. Now on midodrine 2.5 tid. Also w/ AKI. Baseline SCr ~1.6-1.8. Gradual increase in SCr, 1.8>>1.9>>2.4. No improvement despite IVFs. Oliguric. Nephrology consulted.   Today he returns for post hospitalization HF follow up. Overall feeling fine. Currently at [facility], participating in PT although not very active. Denies increasing SOB, CP, dizziness, edema, or PND/Orthopnea. Appetite poor, some nausea. Taking all medications. He is not on lasix.  Labs (3/22): K 4.1, creatinine 1.7 Labs (6/22): K 4.7, creatinine 1.91, AST 55, ALT 46, hgb 11.8.  Medtronic device interrogation (personally reviewed): Fluid index up, thoracic impedence down, no VT/VF, daily activity <1 hr/day.   ECG (personally reviewed): SR w/ AVB (PR 226 ms), QTC pro-longed 549.  Reds Clip: 34%   Review of systems complete and found to be negative unless listed in HPI.    PMH: 1. Type 2 diabetes. 2. HTN 3. PAD: H/o right popliteal occlusion.  4. SVT: Incessant, slow AVNRT.  Ablation in 12/21.  5. PVCs: Frequent, up to 20% of beats by prior monitoring.  6. Atrial fibrillation: Paroxysmal. Has h/o CVA.  7. CVA: Related to atrial fibrillation.   8. 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  9. CKD stage 3 10. H/o junctional rhythm 11. Chronic systolic CHF: Nonischemic cardiomyopathy.  - LHC (1/14): no significant CAD. - Echo (12/21): EF 25-30% with dyssynchrony, severely decreased RV systolic function, biatrial enlargement, s/p bioprosthetic mitral valve replacement  with mean gradient 6 mmHg and no MR, moderate TR.  - RHC (12/21): mean RA 31, PA 57/33 mean 45, mean PCWP 36, PAPi 0.8, CI 2.28, PVR 1.9 WU.  - CPX (4/22): peak VO2 15.9, VE/VCO2 slope 59, RER 0.91 => submaximal,  moderate HF limitation.  - LHC/RHC (5/22): no significant CAD; mean RA 2, PA 27/7, mean PCWP 4, CI 2.6 Fick, CI 3.1 thermo - Echo (6/22): EF 40-45%, diffuse hypokinesis, moderately decreased RV systolic function, IVC normal, bioprosthetic mitral valve normal.   Current Outpatient Medications  Medication Sig Dispense Refill   acetaminophen (TYLENOL) 325 MG tablet Take 650 mg by mouth every 6 (six) hours as needed for mild pain or headache.     amiodarone (PACERONE) 200 MG tablet TAKE 1 TABLET BY MOUTH EVERY DAY 90 tablet 3   atorvastatin (LIPITOR) 40 MG tablet Take 1 tablet (40 mg total) by mouth daily at 6 PM. 30 tablet 5   empagliflozin (JARDIANCE) 10 MG TABS tablet Take 1 tablet (10 mg total) by mouth daily. 90 tablet 3   feeding supplement (ENSURE ENLIVE / ENSURE PLUS) LIQD Take 237 mLs by mouth 2 (two) times daily between meals. 237 mL 12   gabapentin (NEURONTIN) 300 MG capsule Take 300 mg by mouth at bedtime.     HYDROcodone-acetaminophen (NORCO/VICODIN) 5-325 MG tablet Take 1 tablet by mouth 2 (two) times daily as needed for moderate pain. 20 tablet 0   midodrine (PROAMATINE) 10 MG tablet Take 1 tablet (10 mg total) by mouth 3 (three) times daily with meals.     Multiple Vitamins-Minerals (CENTRUM SILVER PO) Take 1 tablet by mouth 3 (three) times a week.     mupirocin ointment (BACTROBAN) 2 % Apply 1 application topically 2 (two) times daily.     pantoprazole (PROTONIX) 40 MG tablet Take 40 mg by mouth daily.      triamcinolone cream (KENALOG) 0.5 % Apply 1 application topically daily as needed (skin rash).      No current facility-administered medications for this encounter.   Allergies  Allergen Reactions   Geralyn Flash [Fish Allergy] Nausea And Vomiting   Social History   Socioeconomic History   Marital status: Divorced    Spouse name: Not on file   Number of children: 3   Years of education: Not on file   Highest education level: Not on file  Occupational History   Not on file   Tobacco Use   Smoking status: Never   Smokeless tobacco: Never  Vaping Use   Vaping Use: Never used  Substance and Sexual Activity   Alcohol use: No    Alcohol/week: 0.0 standard drinks   Drug use: No   Sexual activity: Not on file  Other Topics Concern   Not on file  Social History Narrative   Daughter lives with him.  Retired Advertising account planner   Social Determinants of Radio broadcast assistant Strain: Not on [employer] Insecurity: No Food Insecurity   Worried About Charity fundraiser in the Last Year: Never true   Arboriculturist in the Last Year: Never true  Transportation Needs: No Data processing manager (Medical): No   Lack of Transportation (Non-Medical): No  Physical Activity: Not on file  Stress: Not on file  Social Connections: Not on file  Intimate Partner Violence: Not on file   Family History  Problem Relation Age of Onset   Hypertension Mother    Hypertension Father  BP (!) 164/80   Pulse 70   Wt 73.1 kg   SpO2 100%   BMI 21.86 kg/m   Wt Readings from Last 3 Encounters:  04/03/21 73.1 kg  03/27/21 70.9 kg  03/12/21 77.3 kg   PHYSICAL EXAM: General:  NAD. No resp difficulty, arrived in [facility]. HEENT: Normal Neck: Supple. JVP to ear. Carotids 2+ bilat; no bruits. No lymphadenopathy or thryomegaly appreciated. Cor: PMI nondisplaced. Regular rate & rhythm. No rubs, gallops or murmurs. Lungs: Clear Abdomen: Soft, nontender, nondistended. No hepatosplenomegaly. No bruits or masses. Good bowel sounds. Extremities: No cyanosis, clubbing, rash, 1+ BLE edema R>L Neuro: Alert & oriented x 3, cranial nerves grossly intact. Moves all 4 extremities w/o difficulty. Affect pleasant.  ASSESSMENT & PLAN: PAD - Right popliteal occlusion w/ non-healing ulcers Rt 1st and 2nd toes - s/p R SFA => peroneal bypass 03/06/21. - Management per VVS  2. Stage III CKD - Baseline SCr 1.6-1.8.   - Renal US unremarkable - Stop midodrine today. -  BMET today.   3. 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  Echo 12/21 showed EF 20-25%, moderate RV enlargement with severely decreased RV systolic function, normally functioning bioprosthetic mitral valve with no significant MR and mean gradient 6, moderate TR, dilated IVC.  Patient had normal EF in 12/13 pre-mitral valve replacement.  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  His last cath was in 1/14 pre-surgery, no significant CAD. On [place] in 12/21, evidence for severe biventricular failure with PCWP and RA pressure > 30 and PAPi 0.8.  Equalization of diastolic pressures concerning for restrictive physiology.  Cardiac output preserved (CI 2.28 Fick, 2.33 thermo).  Moderate HF limitation on 4/22 CPX.  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  Also has history of frequent PVCs.  LHC/RHC in 5/22 showed no significant coronary disease, preserved cardiac output, and low filling pressure.  Echo 6/22 with EF 40-45%, moderate RV dysfunction.   - NYHA III, volume up on exam R>L (Reds 34%) & OptiVol. He has not had any lasix at his facility since discharge.  - Start lasix 40 mg + 10 mEq of KCl x 3 days, then use PRN weight gain/swelling. - Stop midodrine. Will have facility check BP x 1 week, if SBP<100, will ask to notify us. - Hold Entresto & spiro for now. - also ? Amyloid w/ BiV failure R>L, conduction disease and hypotension.  - MM panel and urine immunofixation negative. No cMRI w/ recent AKI.  - Can obtain PYP scan as outpatient. - BMET today; repeat 10 days. - Will ask device RN to do transmission in 1 week to follow fluid trend.   4. PAF - In NSR. Continue amio 200 mg daily + coumadin. Follow QTc, 549 ms today. - Surveillance labs (5/22) stable. - CHA2DS2-VASc of at least 3. - On Coumadin PTA  w/ h/o CVA.   5. Bioprosthetic Mitral Valve - Stable function on echo in [id number].   6. H/O Embolic CVA   7. Physical deconditioning - Continue PT at facility  Personally called his facility, he has not had any lasix since discharge.   Follow up in 2 weeks for further medication titration, consider adding back Entresto or spiro if BP and SCr stable.  [place], FNP 04/03/21

## 2021-04-03 ENCOUNTER — Ambulatory Visit (HOSPITAL_COMMUNITY)
Admit: 2021-04-03 | Discharge: 2021-04-03 | Disposition: A | Discharge status: Home / Self Care | Payer: Medicare HMO | Source: Ambulatory Visit | Attending: Family Medicine | Admitting: Family Medicine

## 2021-04-03 ENCOUNTER — Encounter (HOSPITAL_COMMUNITY): Payer: Self-pay

## 2021-04-03 ENCOUNTER — Telehealth (HOSPITAL_COMMUNITY): Payer: Self-pay | Admitting: Family Medicine

## 2021-04-03 ENCOUNTER — Other Ambulatory Visit: Payer: Self-pay

## 2021-04-03 VITALS — BP 164/80 | HR 70 | Wt 161.2 lb

## 2021-04-03 DIAGNOSIS — I130 Hypertensive heart and chronic kidney disease with heart failure and stage 1 through stage 4 chronic kidney disease, or unspecified chronic kidney disease: Secondary | ICD-10-CM | POA: Insufficient documentation

## 2021-04-03 DIAGNOSIS — E1122 Type 2 diabetes mellitus with diabetic chronic kidney disease: Secondary | ICD-10-CM | POA: Insufficient documentation

## 2021-04-03 DIAGNOSIS — Z953 Presence of xenogenic heart valve: Secondary | ICD-10-CM | POA: Insufficient documentation

## 2021-04-03 DIAGNOSIS — Z7901 Long term (current) use of anticoagulants: Secondary | ICD-10-CM | POA: Diagnosis not present

## 2021-04-03 DIAGNOSIS — I779 Disorder of arteries and arterioles, unspecified: Secondary | ICD-10-CM | POA: Diagnosis not present

## 2021-04-03 DIAGNOSIS — I739 Peripheral vascular disease, unspecified: Secondary | ICD-10-CM | POA: Diagnosis not present

## 2021-04-03 DIAGNOSIS — I480 Paroxysmal atrial fibrillation: Secondary | ICD-10-CM | POA: Diagnosis not present

## 2021-04-03 DIAGNOSIS — I5022 Chronic systolic (congestive) heart failure: Secondary | ICD-10-CM | POA: Insufficient documentation

## 2021-04-03 DIAGNOSIS — Z8673 Personal history of transient ischemic attack (TIA), and cerebral infarction without residual deficits: Secondary | ICD-10-CM

## 2021-04-03 DIAGNOSIS — I5082 Biventricular heart failure: Secondary | ICD-10-CM | POA: Diagnosis not present

## 2021-04-03 DIAGNOSIS — N1832 Chronic kidney disease, stage 3b: Secondary | ICD-10-CM | POA: Diagnosis not present

## 2021-04-03 DIAGNOSIS — N183 Chronic kidney disease, stage 3 unspecified: Secondary | ICD-10-CM | POA: Diagnosis not present

## 2021-04-03 DIAGNOSIS — Z8249 Family history of ischemic heart disease and other diseases of the circulatory system: Secondary | ICD-10-CM | POA: Diagnosis not present

## 2021-04-03 DIAGNOSIS — R5381 Other malaise: Secondary | ICD-10-CM | POA: Diagnosis not present

## 2021-04-03 DIAGNOSIS — R110 Nausea: Secondary | ICD-10-CM | POA: Diagnosis not present

## 2021-04-03 DIAGNOSIS — R630 Anorexia: Secondary | ICD-10-CM | POA: Diagnosis not present

## 2021-04-03 DIAGNOSIS — Z79899 Other long term (current) drug therapy: Secondary | ICD-10-CM | POA: Diagnosis not present

## 2021-04-03 DIAGNOSIS — Z9889 Other specified postprocedural states: Secondary | ICD-10-CM | POA: Diagnosis not present

## 2021-04-03 LAB — BASIC METABOLIC PANEL
Anion gap: 8 (ref 5–15)
BUN: 20 mg/dL (ref 8–23)
CO2: 26 mmol/L (ref 22–32)
Calcium: 9.1 mg/dL (ref 8.9–10.3)
Chloride: 108 mmol/L (ref 98–111)
Creatinine, Ser: 1.47 mg/dL — ABNORMAL HIGH (ref 0.61–1.24)
GFR, Estimated: 49 mL/min — ABNORMAL LOW (ref >60)
Glucose, Bld: 96 mg/dL (ref 70–99)
Potassium: 3.7 mmol/L (ref 3.5–5.1)
Sodium: 142 mmol/L (ref 135–145)

## 2021-04-03 MED ORDER — FUROSEMIDE 20 MG PO TABS: ORAL_TABLET | ORAL | 1 refills | Status: DC

## 2021-04-03 MED ORDER — POTASSIUM CHLORIDE CRYS ER 10 MEQ PO TBCR: 10.0000 meq | EXTENDED_RELEASE_TABLET | Freq: Every day | ORAL | 0 refills | Status: DC

## 2021-04-03 NOTE — Progress Notes (Signed)
ReDS Vest / Clip - 04/03/21 1000       ReDS Vest / Clip   Station Marker C    Ruler Value 26.5    ReDS Value Range Low volume    ReDS Actual Value 34

## 2021-04-03 NOTE — Patient Instructions (Signed)
The instructions below were written on a SNF order form and given to the patient  STOP Midodrine START Lasix 40 mg daily for 3 days, then as needed for swelling START Potassium 10 meq daily for 3 days, then as needed with every Lasix dose  Labs today We will only contact you if something comes back abnormal or we need to make some changes. Otherwise no news is good news!  Labs needed in 7-10 days  Keep follow up as scheduled in 2 weeks

## 2021-04-03 NOTE — Telephone Encounter (Signed)
Called and spoke with patient's daughter, [name]. I updated her on visit today and medication changes. She verbalized understanding and agreeable with plan.  Allena Katz, FNP-BC

## 2021-04-06 NOTE — Progress Notes (Signed)
Remote ICD transmission.   

## 2021-04-07 ENCOUNTER — Ambulatory Visit (INDEPENDENT_AMBULATORY_CARE_PROVIDER_SITE_OTHER)
Admit: 2021-04-07 | Discharge: 2021-04-07 | Disposition: A | Discharge status: Home / Self Care | Payer: Medicare HMO | Attending: Vascular Surgery | Admitting: Vascular Surgery

## 2021-04-07 ENCOUNTER — Ambulatory Visit (INDEPENDENT_AMBULATORY_CARE_PROVIDER_SITE_OTHER): Payer: Medicare HMO | Admitting: Physician Assistant

## 2021-04-07 ENCOUNTER — Other Ambulatory Visit: Payer: Self-pay

## 2021-04-07 VITALS — BP 103/62 | HR 75 | Temp 98.2°F | Resp 20 | Ht 72.0 in

## 2021-04-07 DIAGNOSIS — I779 Disorder of arteries and arterioles, unspecified: Secondary | ICD-10-CM

## 2021-04-07 DIAGNOSIS — I739 Peripheral vascular disease, unspecified: Secondary | ICD-10-CM | POA: Insufficient documentation

## 2021-04-07 DIAGNOSIS — S91309A Unspecified open wound, unspecified foot, initial encounter: Secondary | ICD-10-CM

## 2021-04-07 NOTE — Pre-Procedure Instructions (Signed)
;  BREKYN VANGORDER  04/07/2021    Mr. Timmon's procedure is scheduled on 04/08/21 at 1:35 PM.   Report to [facility] Entrance "A" Admitting Office at 10:30 AM.   Call this number if you have problems the morning of surgery: [phone]    Remember:  Mr. Churchwell is not to eat or drink after midnight tonight.  Take these medicines the morning of surgery with A SIP OF WATER: Amiodarone (Pacerone) Pantoprazole (Protonix), Hydrocodone - if needed, Acetaminophen - if need.  Pt is not to take Coumadin in the AM. Pt is not to take is Empagliflozin (Jardiance) in the AM.  Please check Mr. Flax blood sugar when he wakes up in the AM and every 2 hours until he leaves for the hospital. If blood sugar is 70 or below, treat with 1/2 cup of clear juice (apple or cranberry) and recheck blood sugar 15 minutes after drinking juice. If blood sugar continues to be 70 or below, call the Short Stay department and ask to speak to a nurse.   Do not wear jewelry.  Do not wear lotions, powders, cologne or deodorant.  Men may shave face and neck.  Do not bring valuables to the hospital.  [facility] is not responsible for any belongings or valuables.  Contacts, dentures or bridgework may not be worn into surgery.  Leave your suitcase in the car.  After surgery it may be brought to your room.  For patients admitted to the hospital, discharge time will be determined by your treatment team.

## 2021-04-07 NOTE — Progress Notes (Signed)
POST OPERATIVE OFFICE NOTE    CC:  F/u for surgery  HPI:  This is a 75 y.o. male who is s/p right popliteal to peroneal bypass with ipsilateral, translocated, subcutaneous, non-reversed greater saphenous vein on 03/06/2021 by Dr. [name] for non healing wound.   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  He also had a right great toe ulceration that he was able to heal with wound care around that time.    He had a GT tip ulcer and a second toe open wound.  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    He is followed by Cardiology with history of history of endocarditis s/p bioprosthetic mitral valve replacement in 2014, atrial fibrillation/flutter, HTN, DM2, PAD (followed by vascular) chronic systolic heart failure, embolic CVA, anticoagulated with coumadin.  He has CKD stage III  SCr down to 1.74 day of d/c (down from peak of 2.4).  Pt returns today for follow up.  Pt states he is at the facility.  He states his foot feels better but he does have heel wounds.  Difficult to determine if these were present prior to his admission to facility.  The right is worse than left.  Dr. [name] talked with pt and his daughter via telephone.  Pt does have some swelling in the right leg.  He states that his leg is elevated at night when he is in the bed.  He has not had any fever.  He is not allergic to any medications to his knowledge.    Allergies  Allergen Reactions   Geralyn Flash [Fish Allergy] Nausea And Vomiting    Current Outpatient Medications  Medication Sig Dispense Refill   acetaminophen (TYLENOL) 325 MG tablet Take 650 mg by mouth every 6 (six) hours as needed for mild pain or headache.     amiodarone (PACERONE) 200 MG tablet TAKE 1 TABLET BY MOUTH EVERY DAY 90 tablet 3   atorvastatin (LIPITOR) 40 MG tablet Take 1 tablet (40 mg total) by mouth daily at 6 PM. 30 tablet 5   empagliflozin (JARDIANCE) 10 MG TABS tablet Take 1 tablet (10  mg total) by mouth daily. 90 tablet 3   feeding supplement (ENSURE ENLIVE / ENSURE PLUS) LIQD Take 237 mLs by mouth 2 (two) times daily between meals. 237 mL 12   furosemide (LASIX) 20 MG tablet Take 2 tablets (40 mg total) by mouth daily for 3 days, THEN 2 tablets (40 mg total) as needed for edema. 60 tablet 1   gabapentin (NEURONTIN) 300 MG capsule Take 300 mg by mouth at bedtime.     HYDROcodone-acetaminophen (NORCO/VICODIN) 5-325 MG tablet Take 1 tablet by mouth 2 (two) times daily as needed for moderate pain. 20 tablet 0   Multiple Vitamins-Minerals (CENTRUM SILVER PO) Take 1 tablet by mouth 3 (three) times a week.     mupirocin ointment (BACTROBAN) 2 % Apply 1 application topically 2 (two) times daily.     pantoprazole (PROTONIX) 40 MG tablet Take 40 mg by mouth daily.      potassium chloride SA (KLOR-CON) 10 MEQ tablet Take 1 tablet (10 mEq total) by mouth daily for 3 days. 3 tablet 0   triamcinolone cream (KENALOG) 0.5 % Apply 1 application topically daily as needed (skin rash).      No current facility-administered medications for this visit.     ROS:  See HPI  Physical Exam:  Today's Vitals  04/07/21 1528  BP: 103/62  Pulse: 75  Resp: 20  Temp: 98.2 F (36.8 C)  TempSrc: Temporal  SpO2: 99%  Height: 6' (1.829 m)  PainSc: 5    Body mass index is 21.86 kg/m.   Incision:  above knee and below knee incisions are healing Extremities:  +doppler signals bilateral DP/PT/pero Right heel is bogy and malodorous (see picture below)  Right heel   Right great toe   Left heel   Left great toe    ABI/TBI 04/07/2021: Right:  1.92; toe pressure unable to obtain due to gangrenous changes Left:  [place]; toe pressure 30   Arterial duplex 04/07/2021: +----------+--------+-----+--------+---------+-------------------------+  RIGHT     PSV cm/sRatioStenosisWaveform Comments                   +----------+--------+-----+--------+---------+-------------------------+  CFA  Prox  72                   triphasic                           +----------+--------+-----+--------+---------+-------------------------+  CFA Distal46                   triphasic                           +----------+--------+-----+--------+---------+-------------------------+  DFA       33                   biphasic                            +----------+--------+-----+--------+---------+-------------------------+  SFA Prox  59                   biphasic                            +----------+--------+-----+--------+---------+-------------------------+  SFA Mid   77                   triphasicdampened                   +----------+--------+-----+--------+---------+-------------------------+  SFA Distal104                  triphasicdampened , proximal graft  +----------+--------+-----+--------+---------+-------------------------+        Right Graft #1:  +------------------+--------+--------+----------+--------+                    PSV cm/sStenosisWaveform  Comments  +------------------+--------+--------+----------+--------+  Inflow            112             monophasic          +------------------+--------+--------+----------+--------+  Prox Anastomosis  60              monophasic          +------------------+--------+--------+----------+--------+  Proximal Graft    148             monophasicbrisk     +------------------+--------+--------+----------+--------+  Mid Graft         88              monophasicbrisk     +------------------+--------+--------+----------+--------+  Distal Graft      51  monophasic          +------------------+--------+--------+----------+--------+  Distal Anastomosis73              monophasic          +------------------+--------+--------+----------+--------+  Outflow           73              monophasic           +------------------+--------+--------+----------+--------+   Summary:  Right: Patent above knee to below knee bypass graft with no visualized stenosis.     Assessment/Plan:  This is a 75 y.o. male who is s/p: right popliteal to peroneal bypass with ipsilateral, translocated, subcutaneous, non-reversed greater saphenous vein on 03/06/2021 by Dr. [name]  -pt seen with Dr.Hawken-right heel wound concerning for infection.  Pt will be started on clindamycin at facility today.  He will come to hospital tomorrow for debridement of right heel, right great and 2nd toe and possible right below knee amputation if bone is exposed/infected.  This was discussed with pt and daughter via telephone.  Given his multitude of medical issues with CKD, CHF, DM and cardiac issues, will contact the hospitalist for admission after surgery tomorrow.  Per nursing home, pt is on coumadin.  Will have him come to hospital early and check INR and may require FFP.  -per Dr. [name], will start Clindamycin '300mg'$  tid.  Will take wound cx in OR tomorrow and tailor abx pending those results.  -as far as the left leg, he does have a left great toe wound and will need arteriogram during this hospitalization.    -instructed pt that his heels need to be floated off the bed to give them every chance to heal.  This was also put in the instructions back to the facility.    Leontine Locket, [facility] [phone]   Clinic MD:  [name]

## 2021-04-07 NOTE — Progress Notes (Signed)
Attempted to call [facility] numerous times. Receptionist would answer but no one would answer when she put me through to the floor. I left a few voicemails, but no one responded. I finally asked the receptionist for the fax number to send the pre-op instructions to the nurse and she states she would get the fax and take it to the nurse. After faxing the instructions I saw the letter that Dr. Claretha Cooper office sent with the patient and it instructed the patient to arrive at 8 AM for possible infusion of FFP. I called and spoke with the receptionist and asked to tell the nurse to have pt arrive at [place], I had put down 10:30 AM for pt to get a Covid test done.

## 2021-04-07 NOTE — H&P (View-Only) (Signed)
POST OPERATIVE OFFICE NOTE    CC:  F/u for surgery  HPI:  This is a 75 y.o. male who is s/p right popliteal to peroneal bypass with ipsilateral, translocated, subcutaneous, non-reversed greater saphenous vein on 03/06/2021 by Dr. [name] for non healing wound.   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  He also had a right great toe ulceration that he was able to heal with wound care around that time.    He had a GT tip ulcer and a second toe open wound.  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    He is followed by Cardiology with history of history of endocarditis s/p bioprosthetic mitral valve replacement in 2014, atrial fibrillation/flutter, HTN, DM2, PAD (followed by vascular) chronic systolic heart failure, embolic CVA, anticoagulated with coumadin.  He has CKD stage III  SCr down to 1.74 day of d/c (down from peak of 2.4).  Pt returns today for follow up.  Pt states he is at the facility.  He states his foot feels better but he does have heel wounds.  Difficult to determine if these were present prior to his admission to facility.  The right is worse than left.  Dr. [name] talked with pt and his daughter via telephone.  Pt does have some swelling in the right leg.  He states that his leg is elevated at night when he is in the bed.  He has not had any fever.  He is not allergic to any medications to his knowledge.    Allergies  Allergen Reactions   Geralyn Flash [Fish Allergy] Nausea And Vomiting    Current Outpatient Medications  Medication Sig Dispense Refill   acetaminophen (TYLENOL) 325 MG tablet Take 650 mg by mouth every 6 (six) hours as needed for mild pain or headache.     amiodarone (PACERONE) 200 MG tablet TAKE 1 TABLET BY MOUTH EVERY DAY 90 tablet 3   atorvastatin (LIPITOR) 40 MG tablet Take 1 tablet (40 mg total) by mouth daily at 6 PM. 30 tablet 5   empagliflozin (JARDIANCE) 10 MG TABS tablet Take 1 tablet (10  mg total) by mouth daily. 90 tablet 3   feeding supplement (ENSURE ENLIVE / ENSURE PLUS) LIQD Take 237 mLs by mouth 2 (two) times daily between meals. 237 mL 12   furosemide (LASIX) 20 MG tablet Take 2 tablets (40 mg total) by mouth daily for 3 days, THEN 2 tablets (40 mg total) as needed for edema. 60 tablet 1   gabapentin (NEURONTIN) 300 MG capsule Take 300 mg by mouth at bedtime.     HYDROcodone-acetaminophen (NORCO/VICODIN) 5-325 MG tablet Take 1 tablet by mouth 2 (two) times daily as needed for moderate pain. 20 tablet 0   Multiple Vitamins-Minerals (CENTRUM SILVER PO) Take 1 tablet by mouth 3 (three) times a week.     mupirocin ointment (BACTROBAN) 2 % Apply 1 application topically 2 (two) times daily.     pantoprazole (PROTONIX) 40 MG tablet Take 40 mg by mouth daily.      potassium chloride SA (KLOR-CON) 10 MEQ tablet Take 1 tablet (10 mEq total) by mouth daily for 3 days. 3 tablet 0   triamcinolone cream (KENALOG) 0.5 % Apply 1 application topically daily as needed (skin rash).      No current facility-administered medications for this visit.     ROS:  See HPI  Physical Exam:  Today's Vitals  04/07/21 1528  BP: 103/62  Pulse: 75  Resp: 20  Temp: 98.2 F (36.8 C)  TempSrc: Temporal  SpO2: 99%  Height: 6' (1.829 m)  PainSc: 5    Body mass index is 21.86 kg/m.   Incision:  above knee and below knee incisions are healing Extremities:  +doppler signals bilateral DP/PT/pero Right heel is bogy and malodorous (see picture below)  Right heel   Right great toe   Left heel   Left great toe    ABI/TBI 04/07/2021: Right:  1.92; toe pressure unable to obtain due to gangrenous changes Left:  [place]; toe pressure 30   Arterial duplex 04/07/2021: +----------+--------+-----+--------+---------+-------------------------+  RIGHT     PSV cm/sRatioStenosisWaveform Comments                   +----------+--------+-----+--------+---------+-------------------------+  CFA  Prox  72                   triphasic                           +----------+--------+-----+--------+---------+-------------------------+  CFA Distal46                   triphasic                           +----------+--------+-----+--------+---------+-------------------------+  DFA       33                   biphasic                            +----------+--------+-----+--------+---------+-------------------------+  SFA Prox  59                   biphasic                            +----------+--------+-----+--------+---------+-------------------------+  SFA Mid   77                   triphasicdampened                   +----------+--------+-----+--------+---------+-------------------------+  SFA Distal104                  triphasicdampened , proximal graft  +----------+--------+-----+--------+---------+-------------------------+        Right Graft #1:  +------------------+--------+--------+----------+--------+                    PSV cm/sStenosisWaveform  Comments  +------------------+--------+--------+----------+--------+  Inflow            112             monophasic          +------------------+--------+--------+----------+--------+  Prox Anastomosis  60              monophasic          +------------------+--------+--------+----------+--------+  Proximal Graft    148             monophasicbrisk     +------------------+--------+--------+----------+--------+  Mid Graft         88              monophasicbrisk     +------------------+--------+--------+----------+--------+  Distal Graft      51  monophasic          +------------------+--------+--------+----------+--------+  Distal Anastomosis73              monophasic          +------------------+--------+--------+----------+--------+  Outflow           73              monophasic           +------------------+--------+--------+----------+--------+   Summary:  Right: Patent above knee to below knee bypass graft with no visualized stenosis.     Assessment/Plan:  This is a 75 y.o. male who is s/p: right popliteal to peroneal bypass with ipsilateral, translocated, subcutaneous, non-reversed greater saphenous vein on 03/06/2021 by Dr. [name]  -pt seen with Dr.Hawken-right heel wound concerning for infection.  Pt will be started on clindamycin at facility today.  He will come to hospital tomorrow for debridement of right heel, right great and 2nd toe and possible right below knee amputation if bone is exposed/infected.  This was discussed with pt and daughter via telephone.  Given his multitude of medical issues with CKD, CHF, DM and cardiac issues, will contact the hospitalist for admission after surgery tomorrow.  Per nursing home, pt is on coumadin.  Will have him come to hospital early and check INR and may require FFP.  -per Dr. [name], will start Clindamycin '300mg'$  tid.  Will take wound cx in OR tomorrow and tailor abx pending those results.  -as far as the left leg, he does have a left great toe wound and will need arteriogram during this hospitalization.    -instructed pt that his heels need to be floated off the bed to give them every chance to heal.  This was also put in the instructions back to the facility.    Leontine Locket, [facility] [phone]   Clinic MD:  [name]

## 2021-04-08 ENCOUNTER — Encounter (HOSPITAL_COMMUNITY)
Admission: RE | Disposition: A | Discharge status: Rehabilitation Facility | Payer: Self-pay | Source: Home / Self Care | Attending: Internal Medicine

## 2021-04-08 ENCOUNTER — Inpatient Hospital Stay (HOSPITAL_COMMUNITY)
Admission: RE | Admit: 2021-04-08 | Discharge: 2021-05-23 | DRG: 239 | Disposition: A | Discharge status: Rehabilitation Facility | Payer: Medicare HMO | Source: Skilled Nursing Facility | Attending: Internal Medicine | Admitting: Internal Medicine

## 2021-04-08 ENCOUNTER — Encounter (HOSPITAL_COMMUNITY): Payer: Self-pay | Admitting: Vascular Surgery

## 2021-04-08 ENCOUNTER — Encounter (HOSPITAL_COMMUNITY): Payer: Self-pay | Admitting: Anesthesiology

## 2021-04-08 ENCOUNTER — Other Ambulatory Visit: Payer: Self-pay

## 2021-04-08 ENCOUNTER — Encounter: Payer: Self-pay | Admitting: Cardiology

## 2021-04-08 DIAGNOSIS — R0902 Hypoxemia: Secondary | ICD-10-CM | POA: Diagnosis not present

## 2021-04-08 DIAGNOSIS — Z936 Other artificial openings of urinary tract status: Secondary | ICD-10-CM | POA: Diagnosis not present

## 2021-04-08 DIAGNOSIS — M79652 Pain in left thigh: Secondary | ICD-10-CM | POA: Diagnosis not present

## 2021-04-08 DIAGNOSIS — J1282 Pneumonia due to coronavirus disease 2019: Secondary | ICD-10-CM | POA: Diagnosis not present

## 2021-04-08 DIAGNOSIS — M549 Dorsalgia, unspecified: Secondary | ICD-10-CM

## 2021-04-08 DIAGNOSIS — Z95828 Presence of other vascular implants and grafts: Secondary | ICD-10-CM

## 2021-04-08 DIAGNOSIS — I70261 Atherosclerosis of native arteries of extremities with gangrene, right leg: Secondary | ICD-10-CM | POA: Diagnosis not present

## 2021-04-08 DIAGNOSIS — R059 Cough, unspecified: Secondary | ICD-10-CM

## 2021-04-08 DIAGNOSIS — I77810 Thoracic aortic ectasia: Secondary | ICD-10-CM | POA: Diagnosis present

## 2021-04-08 DIAGNOSIS — N185 Chronic kidney disease, stage 5: Secondary | ICD-10-CM | POA: Diagnosis not present

## 2021-04-08 DIAGNOSIS — I5022 Chronic systolic (congestive) heart failure: Secondary | ICD-10-CM | POA: Diagnosis present

## 2021-04-08 DIAGNOSIS — E1140 Type 2 diabetes mellitus with diabetic neuropathy, unspecified: Secondary | ICD-10-CM | POA: Diagnosis present

## 2021-04-08 DIAGNOSIS — K661 Hemoperitoneum: Secondary | ICD-10-CM | POA: Diagnosis not present

## 2021-04-08 DIAGNOSIS — Z8679 Personal history of other diseases of the circulatory system: Secondary | ICD-10-CM

## 2021-04-08 DIAGNOSIS — E1169 Type 2 diabetes mellitus with other specified complication: Secondary | ICD-10-CM | POA: Diagnosis present

## 2021-04-08 DIAGNOSIS — N183 Chronic kidney disease, stage 3 unspecified: Secondary | ICD-10-CM | POA: Diagnosis not present

## 2021-04-08 DIAGNOSIS — Z91013 Allergy to seafood: Secondary | ICD-10-CM

## 2021-04-08 DIAGNOSIS — I959 Hypotension, unspecified: Secondary | ICD-10-CM | POA: Diagnosis present

## 2021-04-08 DIAGNOSIS — D696 Thrombocytopenia, unspecified: Secondary | ICD-10-CM | POA: Diagnosis not present

## 2021-04-08 DIAGNOSIS — S36892A Contusion of other intra-abdominal organs, initial encounter: Secondary | ICD-10-CM | POA: Diagnosis not present

## 2021-04-08 DIAGNOSIS — Z7901 Long term (current) use of anticoagulants: Secondary | ICD-10-CM

## 2021-04-08 DIAGNOSIS — N1330 Unspecified hydronephrosis: Secondary | ICD-10-CM | POA: Diagnosis not present

## 2021-04-08 DIAGNOSIS — L97419 Non-pressure chronic ulcer of right heel and midfoot with unspecified severity: Secondary | ICD-10-CM | POA: Diagnosis present

## 2021-04-08 DIAGNOSIS — Z79899 Other long term (current) drug therapy: Secondary | ICD-10-CM

## 2021-04-08 DIAGNOSIS — E1122 Type 2 diabetes mellitus with diabetic chronic kidney disease: Secondary | ICD-10-CM | POA: Diagnosis present

## 2021-04-08 DIAGNOSIS — R0600 Dyspnea, unspecified: Secondary | ICD-10-CM | POA: Diagnosis not present

## 2021-04-08 DIAGNOSIS — R0602 Shortness of breath: Secondary | ICD-10-CM | POA: Diagnosis not present

## 2021-04-08 DIAGNOSIS — I70245 Atherosclerosis of native arteries of left leg with ulceration of other part of foot: Secondary | ICD-10-CM | POA: Diagnosis not present

## 2021-04-08 DIAGNOSIS — N170 Acute kidney failure with tubular necrosis: Secondary | ICD-10-CM | POA: Diagnosis not present

## 2021-04-08 DIAGNOSIS — Z4659 Encounter for fitting and adjustment of other gastrointestinal appliance and device: Secondary | ICD-10-CM

## 2021-04-08 DIAGNOSIS — Z95810 Presence of automatic (implantable) cardiac defibrillator: Secondary | ICD-10-CM | POA: Diagnosis not present

## 2021-04-08 DIAGNOSIS — L97514 Non-pressure chronic ulcer of other part of right foot with necrosis of bone: Secondary | ICD-10-CM | POA: Diagnosis not present

## 2021-04-08 DIAGNOSIS — I82412 Acute embolism and thrombosis of left femoral vein: Secondary | ICD-10-CM | POA: Diagnosis not present

## 2021-04-08 DIAGNOSIS — N131 Hydronephrosis with ureteral stricture, not elsewhere classified: Secondary | ICD-10-CM | POA: Diagnosis not present

## 2021-04-08 DIAGNOSIS — R1312 Dysphagia, oropharyngeal phase: Secondary | ICD-10-CM | POA: Diagnosis not present

## 2021-04-08 DIAGNOSIS — I70229 Atherosclerosis of native arteries of extremities with rest pain, unspecified extremity: Secondary | ICD-10-CM

## 2021-04-08 DIAGNOSIS — I70202 Unspecified atherosclerosis of native arteries of extremities, left leg: Secondary | ICD-10-CM | POA: Diagnosis not present

## 2021-04-08 DIAGNOSIS — I2699 Other pulmonary embolism without acute cor pulmonale: Secondary | ICD-10-CM | POA: Diagnosis not present

## 2021-04-08 DIAGNOSIS — E119 Type 2 diabetes mellitus without complications: Secondary | ICD-10-CM

## 2021-04-08 DIAGNOSIS — L97529 Non-pressure chronic ulcer of other part of left foot with unspecified severity: Secondary | ICD-10-CM | POA: Diagnosis present

## 2021-04-08 DIAGNOSIS — N19 Unspecified kidney failure: Secondary | ICD-10-CM | POA: Diagnosis not present

## 2021-04-08 DIAGNOSIS — Z7189 Other specified counseling: Secondary | ICD-10-CM | POA: Diagnosis not present

## 2021-04-08 DIAGNOSIS — E785 Hyperlipidemia, unspecified: Secondary | ICD-10-CM | POA: Diagnosis present

## 2021-04-08 DIAGNOSIS — Z89611 Acquired absence of right leg above knee: Secondary | ICD-10-CM | POA: Diagnosis not present

## 2021-04-08 DIAGNOSIS — E875 Hyperkalemia: Secondary | ICD-10-CM | POA: Diagnosis not present

## 2021-04-08 DIAGNOSIS — E876 Hypokalemia: Secondary | ICD-10-CM | POA: Diagnosis not present

## 2021-04-08 DIAGNOSIS — M1990 Unspecified osteoarthritis, unspecified site: Secondary | ICD-10-CM | POA: Diagnosis present

## 2021-04-08 DIAGNOSIS — E1151 Type 2 diabetes mellitus with diabetic peripheral angiopathy without gangrene: Secondary | ICD-10-CM | POA: Diagnosis not present

## 2021-04-08 DIAGNOSIS — R918 Other nonspecific abnormal finding of lung field: Secondary | ICD-10-CM | POA: Diagnosis not present

## 2021-04-08 DIAGNOSIS — Z4781 Encounter for orthopedic aftercare following surgical amputation: Secondary | ICD-10-CM | POA: Diagnosis not present

## 2021-04-08 DIAGNOSIS — I70345 Atherosclerosis of unspecified type of bypass graft(s) of the left leg with ulceration of other part of foot: Secondary | ICD-10-CM | POA: Diagnosis not present

## 2021-04-08 DIAGNOSIS — I130 Hypertensive heart and chronic kidney disease with heart failure and stage 1 through stage 4 chronic kidney disease, or unspecified chronic kidney disease: Secondary | ICD-10-CM | POA: Diagnosis present

## 2021-04-08 DIAGNOSIS — N135 Crossing vessel and stricture of ureter without hydronephrosis: Secondary | ICD-10-CM | POA: Diagnosis not present

## 2021-04-08 DIAGNOSIS — N1832 Chronic kidney disease, stage 3b: Secondary | ICD-10-CM | POA: Diagnosis not present

## 2021-04-08 DIAGNOSIS — R609 Edema, unspecified: Secondary | ICD-10-CM | POA: Diagnosis not present

## 2021-04-08 DIAGNOSIS — I70344 Atherosclerosis of unspecified type of bypass graft(s) of the left leg with ulceration of heel and midfoot: Secondary | ICD-10-CM | POA: Diagnosis not present

## 2021-04-08 DIAGNOSIS — D62 Acute posthemorrhagic anemia: Secondary | ICD-10-CM | POA: Diagnosis not present

## 2021-04-08 DIAGNOSIS — Z8249 Family history of ischemic heart disease and other diseases of the circulatory system: Secondary | ICD-10-CM

## 2021-04-08 DIAGNOSIS — L8990 Pressure ulcer of unspecified site, unspecified stage: Secondary | ICD-10-CM | POA: Diagnosis present

## 2021-04-08 DIAGNOSIS — I493 Ventricular premature depolarization: Secondary | ICD-10-CM | POA: Diagnosis not present

## 2021-04-08 DIAGNOSIS — J159 Unspecified bacterial pneumonia: Secondary | ICD-10-CM | POA: Diagnosis not present

## 2021-04-08 DIAGNOSIS — R531 Weakness: Secondary | ICD-10-CM | POA: Diagnosis not present

## 2021-04-08 DIAGNOSIS — R1310 Dysphagia, unspecified: Secondary | ICD-10-CM | POA: Diagnosis not present

## 2021-04-08 DIAGNOSIS — R791 Abnormal coagulation profile: Secondary | ICD-10-CM | POA: Diagnosis not present

## 2021-04-08 DIAGNOSIS — I420 Dilated cardiomyopathy: Secondary | ICD-10-CM | POA: Diagnosis present

## 2021-04-08 DIAGNOSIS — D649 Anemia, unspecified: Secondary | ICD-10-CM | POA: Diagnosis not present

## 2021-04-08 DIAGNOSIS — Z7401 Bed confinement status: Secondary | ICD-10-CM | POA: Diagnosis not present

## 2021-04-08 DIAGNOSIS — Z7984 Long term (current) use of oral hypoglycemic drugs: Secondary | ICD-10-CM

## 2021-04-08 DIAGNOSIS — B379 Candidiasis, unspecified: Secondary | ICD-10-CM | POA: Diagnosis not present

## 2021-04-08 DIAGNOSIS — Z4901 Encounter for fitting and adjustment of extracorporeal dialysis catheter: Secondary | ICD-10-CM | POA: Diagnosis not present

## 2021-04-08 DIAGNOSIS — I10 Essential (primary) hypertension: Secondary | ICD-10-CM | POA: Diagnosis present

## 2021-04-08 DIAGNOSIS — Z4682 Encounter for fitting and adjustment of non-vascular catheter: Secondary | ICD-10-CM | POA: Diagnosis not present

## 2021-04-08 DIAGNOSIS — J9601 Acute respiratory failure with hypoxia: Secondary | ICD-10-CM | POA: Diagnosis not present

## 2021-04-08 DIAGNOSIS — M868X7 Other osteomyelitis, ankle and foot: Secondary | ICD-10-CM | POA: Diagnosis not present

## 2021-04-08 DIAGNOSIS — M2550 Pain in unspecified joint: Secondary | ICD-10-CM | POA: Diagnosis not present

## 2021-04-08 DIAGNOSIS — R41841 Cognitive communication deficit: Secondary | ICD-10-CM | POA: Diagnosis not present

## 2021-04-08 DIAGNOSIS — E1152 Type 2 diabetes mellitus with diabetic peripheral angiopathy with gangrene: Secondary | ICD-10-CM | POA: Diagnosis not present

## 2021-04-08 DIAGNOSIS — Z8673 Personal history of transient ischemic attack (TIA), and cerebral infarction without residual deficits: Secondary | ICD-10-CM

## 2021-04-08 DIAGNOSIS — I120 Hypertensive chronic kidney disease with stage 5 chronic kidney disease or end stage renal disease: Secondary | ICD-10-CM | POA: Diagnosis not present

## 2021-04-08 DIAGNOSIS — L89610 Pressure ulcer of right heel, unstageable: Secondary | ICD-10-CM | POA: Diagnosis present

## 2021-04-08 DIAGNOSIS — I4891 Unspecified atrial fibrillation: Secondary | ICD-10-CM | POA: Diagnosis not present

## 2021-04-08 DIAGNOSIS — U071 COVID-19: Secondary | ICD-10-CM | POA: Diagnosis not present

## 2021-04-08 DIAGNOSIS — I96 Gangrene, not elsewhere classified: Secondary | ICD-10-CM | POA: Diagnosis not present

## 2021-04-08 DIAGNOSIS — I700 Atherosclerosis of aorta: Secondary | ICD-10-CM | POA: Diagnosis not present

## 2021-04-08 DIAGNOSIS — I471 Supraventricular tachycardia: Secondary | ICD-10-CM | POA: Diagnosis not present

## 2021-04-08 DIAGNOSIS — J90 Pleural effusion, not elsewhere classified: Secondary | ICD-10-CM | POA: Diagnosis not present

## 2021-04-08 DIAGNOSIS — M7981 Nontraumatic hematoma of soft tissue: Secondary | ICD-10-CM | POA: Diagnosis not present

## 2021-04-08 DIAGNOSIS — N1339 Other hydronephrosis: Secondary | ICD-10-CM | POA: Diagnosis not present

## 2021-04-08 DIAGNOSIS — E11621 Type 2 diabetes mellitus with foot ulcer: Secondary | ICD-10-CM | POA: Diagnosis present

## 2021-04-08 DIAGNOSIS — S37012A Minor contusion of left kidney, initial encounter: Secondary | ICD-10-CM | POA: Diagnosis not present

## 2021-04-08 DIAGNOSIS — I517 Cardiomegaly: Secondary | ICD-10-CM | POA: Diagnosis not present

## 2021-04-08 DIAGNOSIS — M869 Osteomyelitis, unspecified: Secondary | ICD-10-CM | POA: Diagnosis present

## 2021-04-08 DIAGNOSIS — L89152 Pressure ulcer of sacral region, stage 2: Secondary | ICD-10-CM | POA: Diagnosis present

## 2021-04-08 DIAGNOSIS — J9811 Atelectasis: Secondary | ICD-10-CM | POA: Diagnosis not present

## 2021-04-08 DIAGNOSIS — L97519 Non-pressure chronic ulcer of other part of right foot with unspecified severity: Secondary | ICD-10-CM | POA: Diagnosis not present

## 2021-04-08 DIAGNOSIS — Z953 Presence of xenogenic heart valve: Secondary | ICD-10-CM | POA: Diagnosis not present

## 2021-04-08 DIAGNOSIS — M47812 Spondylosis without myelopathy or radiculopathy, cervical region: Secondary | ICD-10-CM | POA: Diagnosis not present

## 2021-04-08 DIAGNOSIS — Z515 Encounter for palliative care: Secondary | ICD-10-CM | POA: Diagnosis not present

## 2021-04-08 DIAGNOSIS — I82409 Acute embolism and thrombosis of unspecified deep veins of unspecified lower extremity: Secondary | ICD-10-CM | POA: Diagnosis not present

## 2021-04-08 DIAGNOSIS — N179 Acute kidney failure, unspecified: Secondary | ICD-10-CM | POA: Diagnosis not present

## 2021-04-08 DIAGNOSIS — K8020 Calculus of gallbladder without cholecystitis without obstruction: Secondary | ICD-10-CM | POA: Diagnosis not present

## 2021-04-08 DIAGNOSIS — S301XXA Contusion of abdominal wall, initial encounter: Secondary | ICD-10-CM | POA: Diagnosis not present

## 2021-04-08 DIAGNOSIS — M7989 Other specified soft tissue disorders: Secondary | ICD-10-CM | POA: Diagnosis not present

## 2021-04-08 DIAGNOSIS — I4821 Permanent atrial fibrillation: Secondary | ICD-10-CM | POA: Diagnosis present

## 2021-04-08 DIAGNOSIS — E46 Unspecified protein-calorie malnutrition: Secondary | ICD-10-CM | POA: Diagnosis not present

## 2021-04-08 DIAGNOSIS — M6281 Muscle weakness (generalized): Secondary | ICD-10-CM | POA: Diagnosis not present

## 2021-04-08 DIAGNOSIS — I82622 Acute embolism and thrombosis of deep veins of left upper extremity: Secondary | ICD-10-CM | POA: Diagnosis not present

## 2021-04-08 DIAGNOSIS — I5043 Acute on chronic combined systolic (congestive) and diastolic (congestive) heart failure: Secondary | ICD-10-CM | POA: Diagnosis not present

## 2021-04-08 LAB — PROTIME-INR
INR: 3.2 — ABNORMAL HIGH (ref 0.8–1.2)
INR: 2.3 — ABNORMAL HIGH (ref 0.8–1.2)
Prothrombin Time: 33.1 seconds — ABNORMAL HIGH (ref 11.4–15.2)
Prothrombin Time: 25.5 seconds — ABNORMAL HIGH (ref 11.4–15.2)

## 2021-04-08 LAB — GLUCOSE, CAPILLARY
Glucose-Capillary: 91 mg/dL (ref 70–99)
Glucose-Capillary: 94 mg/dL (ref 70–99)
Glucose-Capillary: 136 mg/dL — ABNORMAL HIGH (ref 70–99)
Glucose-Capillary: 84 mg/dL (ref 70–99)
Glucose-Capillary: 131 mg/dL — ABNORMAL HIGH (ref 70–99)

## 2021-04-08 LAB — URINALYSIS, ROUTINE W REFLEX MICROSCOPIC
Bilirubin Urine: NEGATIVE
Glucose, UA: >=500 mg/dL — AB
Hgb urine dipstick: NEGATIVE
Ketones, ur: NEGATIVE mg/dL
Nitrite: NEGATIVE
Protein, ur: NEGATIVE mg/dL
Specific Gravity, Urine: 1.017 (ref 1.005–1.030)
WBC, UA: >50 WBC/hpf — ABNORMAL HIGH (ref 0–5)
pH: 7 (ref 5.0–8.0)

## 2021-04-08 LAB — CBC
HCT: 35.3 % — ABNORMAL LOW (ref 39.0–52.0)
Hemoglobin: 10.1 g/dL — ABNORMAL LOW (ref 13.0–17.0)
MCH: 23.4 pg — ABNORMAL LOW (ref 26.0–34.0)
MCHC: 28.6 g/dL — ABNORMAL LOW (ref 30.0–36.0)
MCV: 81.9 fL (ref 80.0–100.0)
Platelets: 269 10*3/uL (ref 150–400)
RBC: 4.31 MIL/uL (ref 4.22–5.81)
RDW: 18.1 % — ABNORMAL HIGH (ref 11.5–15.5)
WBC: 6.4 10*3/uL (ref 4.0–10.5)
nRBC: 0 % (ref 0.0–0.2)

## 2021-04-08 LAB — COMPREHENSIVE METABOLIC PANEL
ALT: 18 U/L (ref 0–44)
AST: 22 U/L (ref 15–41)
Albumin: 3.2 g/dL — ABNORMAL LOW (ref 3.5–5.0)
Alkaline Phosphatase: 123 U/L (ref 38–126)
Anion gap: 9 (ref 5–15)
BUN: 19 mg/dL (ref 8–23)
CO2: 26 mmol/L (ref 22–32)
Calcium: 9.2 mg/dL (ref 8.9–10.3)
Chloride: 105 mmol/L (ref 98–111)
Creatinine, Ser: 1.6 mg/dL — ABNORMAL HIGH (ref 0.61–1.24)
GFR, Estimated: 44 mL/min — ABNORMAL LOW (ref >60)
Glucose, Bld: 97 mg/dL (ref 70–99)
Potassium: 4.1 mmol/L (ref 3.5–5.1)
Sodium: 140 mmol/L (ref 135–145)
Total Bilirubin: 0.6 mg/dL (ref 0.3–1.2)
Total Protein: 6.6 g/dL (ref 6.5–8.1)

## 2021-04-08 LAB — C-REACTIVE PROTEIN: CRP: 1.8 mg/dL — ABNORMAL HIGH (ref <1.0)

## 2021-04-08 LAB — TYPE AND SCREEN
ABO/RH(D): A POS
Antibody Screen: NEGATIVE

## 2021-04-08 LAB — SURGICAL PCR SCREEN
MRSA, PCR: NEGATIVE
Staphylococcus aureus: NEGATIVE

## 2021-04-08 LAB — PREALBUMIN: Prealbumin: 23 mg/dL (ref 18–38)

## 2021-04-08 LAB — LACTIC ACID, PLASMA: Lactic Acid, Venous: 1.6 mmol/L (ref 0.5–1.9)

## 2021-04-08 LAB — SARS CORONAVIRUS 2 BY RT PCR (HOSPITAL ORDER, PERFORMED IN ~~LOC~~ HOSPITAL LAB): SARS Coronavirus 2: NEGATIVE

## 2021-04-08 LAB — SEDIMENTATION RATE: Sed Rate: 24 mm/hr — ABNORMAL HIGH (ref 0–16)

## 2021-04-08 LAB — APTT: aPTT: 47 seconds — ABNORMAL HIGH (ref 24–36)

## 2021-04-08 LAB — HEMOGLOBIN A1C
Hgb A1c MFr Bld: 5.4 % (ref 4.8–5.6)
Mean Plasma Glucose: 108.28 mg/dL

## 2021-04-08 SURGERY — DEBRIDEMENT, WOUND
Anesthesia: General | Site: Knee | Laterality: Right

## 2021-04-08 MED ORDER — ONDANSETRON HCL 4 MG PO TABS
4.0000 mg | ORAL_TABLET | Freq: Four times a day (QID) | ORAL | Status: DC | PRN
  Administered 2021-04-16: 4 mg via ORAL
  Filled 2021-04-08: qty 1

## 2021-04-08 MED ORDER — AMIODARONE HCL 200 MG PO TABS
200.0000 mg | ORAL_TABLET | Freq: Every day | ORAL | Status: DC
  Administered 2021-04-08 – 2021-05-22 (×43): 200 mg via ORAL
  Filled 2021-04-08 (×44): qty 1

## 2021-04-08 MED ORDER — PANTOPRAZOLE SODIUM 40 MG PO TBEC
40.0000 mg | DELAYED_RELEASE_TABLET | Freq: Every day | ORAL | Status: DC
  Administered 2021-04-08 – 2021-05-22 (×41): 40 mg via ORAL
  Filled 2021-04-08 (×44): qty 1

## 2021-04-08 MED ORDER — ONDANSETRON HCL 4 MG/2ML IJ SOLN: 4.0000 mg | Freq: Four times a day (QID) | INTRAMUSCULAR | Status: DC | PRN

## 2021-04-08 MED ORDER — CHLORHEXIDINE GLUCONATE 0.12 % MT SOLN
15.0000 mL | Freq: Once | OROMUCOSAL | Status: AC
  Administered 2021-04-08: 15 mL via OROMUCOSAL
  Filled 2021-04-08: qty 15

## 2021-04-08 MED ORDER — SODIUM CHLORIDE 0.9 % IV SOLN: INTRAVENOUS | Status: DC

## 2021-04-08 MED ORDER — ATORVASTATIN CALCIUM 40 MG PO TABS
40.0000 mg | ORAL_TABLET | Freq: Every day | ORAL | Status: DC
  Administered 2021-04-08 – 2021-05-22 (×41): 40 mg via ORAL
  Filled 2021-04-08 (×40): qty 1

## 2021-04-08 MED ORDER — SODIUM CHLORIDE 0.9 % IV SOLN
2.0000 g | INTRAVENOUS | Status: DC
  Administered 2021-04-08 – 2021-04-12 (×5): 2 g via INTRAVENOUS
  Filled 2021-04-08: qty 20
  Filled 2021-04-08: qty 2
  Filled 2021-04-08: qty 20
  Filled 2021-04-08: qty 2
  Filled 2021-04-08 (×3): qty 20

## 2021-04-08 MED ORDER — HYDRALAZINE HCL 20 MG/ML IJ SOLN: 5.0000 mg | INTRAMUSCULAR | Status: DC | PRN

## 2021-04-08 MED ORDER — MORPHINE SULFATE (PF) 2 MG/ML IV SOLN
2.0000 mg | INTRAVENOUS | Status: DC | PRN
  Administered 2021-04-13 – 2021-04-17 (×4): 2 mg via INTRAVENOUS
  Filled 2021-04-08 (×5): qty 1

## 2021-04-08 MED ORDER — DOCUSATE SODIUM 100 MG PO CAPS
100.0000 mg | ORAL_CAPSULE | Freq: Two times a day (BID) | ORAL | Status: DC
  Administered 2021-04-08 – 2021-05-22 (×74): 100 mg via ORAL
  Filled 2021-04-08 (×77): qty 1

## 2021-04-08 MED ORDER — LACTATED RINGERS IV SOLN: INTRAVENOUS | Status: DC

## 2021-04-08 MED ORDER — ALBUMIN HUMAN 5 % IV SOLN
12.5000 g | Freq: Once | INTRAVENOUS | Status: AC
  Administered 2021-04-08: 12.5 g via INTRAVENOUS
  Filled 2021-04-08: qty 250

## 2021-04-08 MED ORDER — BISACODYL 5 MG PO TBEC
5.0000 mg | DELAYED_RELEASE_TABLET | Freq: Every day | ORAL | Status: DC | PRN
  Administered 2021-04-12 – 2021-05-14 (×4): 5 mg via ORAL
  Filled 2021-04-08 (×6): qty 1

## 2021-04-08 MED ORDER — CEFAZOLIN SODIUM-DEXTROSE 2-4 GM/100ML-% IV SOLN
2.0000 g | INTRAVENOUS | Status: DC
  Filled 2021-04-08: qty 100

## 2021-04-08 MED ORDER — ACETAMINOPHEN 325 MG PO TABS: 650.0000 mg | ORAL_TABLET | Freq: Four times a day (QID) | ORAL | Status: DC | PRN

## 2021-04-08 MED ORDER — INSULIN ASPART 100 UNIT/ML IJ SOLN
0.0000 [IU] | Freq: Three times a day (TID) | INTRAMUSCULAR | Status: DC
  Administered 2021-04-10: 3 [IU] via SUBCUTANEOUS
  Administered 2021-04-11 – 2021-04-25 (×11): 2 [IU] via SUBCUTANEOUS
  Administered 2021-04-26: 0 [IU] via SUBCUTANEOUS
  Administered 2021-04-26: 2 [IU] via SUBCUTANEOUS
  Administered 2021-04-30: 3 [IU] via SUBCUTANEOUS
  Administered 2021-05-01: 5 [IU] via SUBCUTANEOUS
  Administered 2021-05-02: 3 [IU] via SUBCUTANEOUS
  Administered 2021-05-03 – 2021-05-06 (×4): 2 [IU] via SUBCUTANEOUS
  Administered 2021-05-06 – 2021-05-07 (×3): 3 [IU] via SUBCUTANEOUS
  Administered 2021-05-07: 2 [IU] via SUBCUTANEOUS
  Administered 2021-05-07: 3 [IU] via SUBCUTANEOUS
  Administered 2021-05-08: 2 [IU] via SUBCUTANEOUS
  Administered 2021-05-08: 3 [IU] via SUBCUTANEOUS

## 2021-04-08 MED ORDER — VITAMIN K1 10 MG/ML IJ SOLN
2.0000 mg | INTRAVENOUS | Status: AC
  Administered 2021-04-08: 2 mg via INTRAVENOUS
  Filled 2021-04-08: qty 0.2

## 2021-04-08 MED ORDER — GABAPENTIN 300 MG PO CAPS
300.0000 mg | ORAL_CAPSULE | Freq: Every day | ORAL | Status: DC
  Administered 2021-04-08 – 2021-04-18 (×11): 300 mg via ORAL
  Filled 2021-04-08 (×11): qty 1

## 2021-04-08 MED ORDER — CHLORHEXIDINE GLUCONATE CLOTH 2 % EX PADS
6.0000 | MEDICATED_PAD | Freq: Once | CUTANEOUS | Status: AC
  Administered 2021-04-08: 6 via TOPICAL

## 2021-04-08 MED ORDER — ACETAMINOPHEN 650 MG RE SUPP: 650.0000 mg | Freq: Four times a day (QID) | RECTAL | Status: DC | PRN

## 2021-04-08 MED ORDER — HYDROCODONE-ACETAMINOPHEN 5-325 MG PO TABS
1.0000 | ORAL_TABLET | Freq: Two times a day (BID) | ORAL | Status: DC | PRN
  Administered 2021-04-09: 1 via ORAL
  Filled 2021-04-08: qty 1

## 2021-04-08 MED ORDER — METRONIDAZOLE 500 MG/100ML IV SOLN
500.0000 mg | Freq: Three times a day (TID) | INTRAVENOUS | Status: DC
Start: 2021-04-08 — End: 2021-04-13
  Administered 2021-04-08 – 2021-04-13 (×14): 500 mg via INTRAVENOUS
  Filled 2021-04-08 (×14): qty 100

## 2021-04-08 MED ORDER — ORAL CARE MOUTH RINSE: 15.0000 mL | Freq: Once | OROMUCOSAL | Status: AC

## 2021-04-08 MED ORDER — CHLORHEXIDINE GLUCONATE CLOTH 2 % EX PADS: 6.0000 | MEDICATED_PAD | Freq: Once | CUTANEOUS | Status: DC

## 2021-04-08 NOTE — Progress Notes (Signed)
ANTICOAGULATION CONSULT NOTE - Initial Consult  Pharmacy Consult for Heparin Indication:  afib, h/o CVA  Allergies  Allergen Reactions   Tuna [Fish Allergy] Nausea And Vomiting    Patient Measurements: Height: 6' (182.9 cm) Weight: 73.1 kg (161 lb 2.5 oz) IBW/kg (Calculated) : 77.6 Heparin Dosing Weight: 73.1 kg  Vital Signs: Temp: 98.3 F (36.8 C) (07/20 1239) Temp Source: Oral (07/20 1239) BP: 106/54 (07/20 1418) Pulse Rate: 83 (07/20 1239)  Labs: Recent Labs    04/08/21 0826  HGB 10.1*  HCT 35.3*  PLT 269  APTT 47*  LABPROT 33.1*  INR 3.2*  CREATININE 1.60*    Estimated Creatinine Clearance: 40 mL/min (A) (by C-G formula based on SCr of 1.6 mg/dL (H)).   Assessment: CC/HPI: Was supposed to come to hospital 7/20 for debridement of right heel, right great and 2nd toe and possible right below knee amputation if bone is exposed/infected. Surgery cancelled 7/20 by Dr. Donzetta Matters  PMH: endocarditis s/p bioprosthetic mitral valve replacement in 2014, atrial fibrillation/flutter, HTN, DM2, PAD,chronic systolic heart failure, embolic CVA, anticoagulated with coumadin.  He has CKD stage III, AICD, arthritis,  - s/p right popliteal to peroneal bypass with ipsilateral, translocated, subcutaneous, non-reversed greater saphenous vein on 03/06/2021   Anticoag: h/o afib/CVA on Coumadin PTA. Hgb 10.1. Plts 269. - Admit INR 3.2 on warfarin 7.'5mg'$  daily  Goal of Therapy:  INR<2 for surgery Heparin level 0.3-0.5 Monitor platelets by anticoagulation protocol: Yes   Plan:  IV Vit K '2mg'$  x 1 Check INR this evening. If still elevated, can give more Vit K Start IV heparin when INR <=2   Mitsue Peery S. Alford Highland, PharmD, BCPS Clinical Staff Pharmacist [email or web address] Alford Highland, [employer] 04/08/2021,2:57 PM

## 2021-04-08 NOTE — Anesthesia Preprocedure Evaluation (Deleted)
Anesthesia Evaluation    Reviewed: Allergy & Precautions, Patient's Chart, lab work & pertinent test results  History of Anesthesia Complications Negative for: history of anesthetic complications  Airway        Dental  (+) Edentulous Upper, Missing, Poor Dentition   Pulmonary neg pulmonary ROS,           Cardiovascular hypertension, Pt. on medications + Peripheral Vascular Disease and +CHF  + Cardiac Defibrillator   Echo  1. Left ventricular ejection fraction, by estimation, is 40 to 45%. The left ventricle has mildly decreased function. The left ventricle demonstrates global hypokinesis. Left ventricular diastolic parameters are indeterminate.  2. Right ventricular systolic function is moderately reduced. The right ventricular size is mildly enlarged. There is normal pulmonary artery systolic pressure. The estimated right ventricular systolic pressure is 27.2 mmHg.  3. Left atrial size was severely dilated.  4. Right atrial size was mildly dilated.  5. Bioprosthetic mitral valve. Mean gradient 6 mmHg, no significant regurgitation noted.  6. The aortic valve is tricuspid. Aortic valve regurgitation is not visualized. Mild aortic valve sclerosis is present, with no evidence of aortic valve stenosis.  7. Aortic dilatation noted. There is mild dilatation of the aortic root, measuring 42 mm.  8. The inferior vena cava is normal in size with greater than 50% respiratory variability, suggesting right atrial pressure of 3 mmHg.    AICD interrogation 11/2020 DDDR Total VP 1.0% (MVP On) AS-VS 31.0% AS-VP < 0.1% AP-VS 68.0% AP-VP 1.0%   Neuro/Psych CVA    GI/Hepatic negative GI ROS, Neg liver ROS,   Endo/Other  diabetes  Renal/GU Renal disease     Musculoskeletal  (+) Arthritis ,   Abdominal   Peds  Hematology negative hematology ROS (+)   Anesthesia Other Findings   Reproductive/Obstetrics                                                               Anesthesia Evaluation  Patient identified by MRN, date of birth, ID band Patient awake    Reviewed: Allergy & Precautions, NPO status , Patient's Chart, lab work & pertinent test results  Airway Mallampati: I  TM Distance: >3 FB Neck ROM: Full    Dental  (+) Upper Dentures, Partial Lower, Dental Advisory Given   Pulmonary neg pulmonary ROS,    breath sounds clear to auscultation       Cardiovascular hypertension, Pt. on medications + Peripheral Vascular Disease and +CHF  + dysrhythmias Atrial Fibrillation and Supra Ventricular Tachycardia + Cardiac Defibrillator  Rhythm:Regular Rate:Normal     Neuro/Psych CVA negative psych ROS   GI/Hepatic Neg liver ROS, GERD  Medicated,  Endo/Other  diabetes  Renal/GU      Musculoskeletal negative musculoskeletal ROS (+)   Abdominal Normal abdominal exam  (+)   Peds  Hematology negative hematology ROS (+)   Anesthesia Other Findings   Reproductive/Obstetrics                             Anesthesia Physical Anesthesia Plan  ASA: IV  Anesthesia Plan: MAC   Post-op Pain Management:    Induction: Intravenous  PONV Risk Score and Plan: 2 and Ondansetron and Treatment may vary due to age or medical  condition  Airway Management Planned: Simple Face Mask and Natural Airway  Additional Equipment: None  Intra-op Plan:   Post-operative Plan:   Informed Consent: I have reviewed the patients History and Physical, chart, labs and discussed the procedure including the risks, benefits and alternatives for the proposed anesthesia with the patient or authorized representative who has indicated his/her understanding and acceptance.     Dental advisory given  Plan Discussed with: CRNA  Anesthesia Plan Comments: (Echo:  1. There is substantial LV setal-lateral dyssynchrony. Left ventricular  ejection  fraction, by estimation, is 25 to 30%. The left ventricle has  severely decreased function. The left ventricle demonstrates global  hypokinesis. Left ventricular diastolic  function could not be evaluated.  2. Right ventricular systolic function is severely reduced. The right  ventricular size is severely enlarged. There is moderately elevated  pulmonary artery systolic pressure.  3. Left atrial size was moderately dilated.  4. Right atrial size was severely dilated.  5. The mitral valve has been repaired/replaced. No evidence of mitral  valve regurgitation. No evidence of mitral stenosis. The mean mitral valve  gradient is 6.1 mmHg with average heart rate of 121 bpm. There is a  bioprosthetic valve present in the  mitral position.  6. Tricuspid valve regurgitation is moderate.  7. The aortic valve is tricuspid. Aortic valve regurgitation is not  visualized. No aortic stenosis is present.  8. There is borderline dilatation of the ascending aorta, measuring 37  mm.  9. The inferior vena cava is dilated in size with <50% respiratory  variability, suggesting right atrial pressure of 15 mmHg. )      Anesthesia Quick Evaluation  Anesthesia Physical  Anesthesia Plan  ASA: 4  Anesthesia Plan: General   Post-op Pain Management:    Induction: Intravenous  PONV Risk Score and Plan: 3 and Ondansetron, Dexamethasone and Treatment may vary due to age or medical condition  Airway Management Planned: LMA  Additional Equipment:   Intra-op Plan:   Post-operative Plan: Extubation in OR  Informed Consent: I have reviewed the patients History and Physical, chart, labs and discussed the procedure including the risks, benefits and alternatives for the proposed anesthesia with the patient or authorized representative who has indicated his/her understanding and acceptance.     Dental advisory given  Plan Discussed with: CRNA  Anesthesia Plan Comments: (Surgeon cancelled due  to INR)       Anesthesia Quick Evaluation

## 2021-04-08 NOTE — Progress Notes (Signed)
Patient's surgery canceled for today by Dr. Donzetta Matters. Patient to be admitted. Patient given sandwich and beverage while waiting for bed placement. At 1239 patient's BP 88/43 and at 1253 patient's BP 93/39. Dr. Jerold Coombe made aware and verbal order received for albumin. Patient denies any complaints.

## 2021-04-08 NOTE — Progress Notes (Signed)
Patient has ICD. Message sent to device clinic on 04/07/21 at 6:42 pm by York Spaniel, RN. No response received at this time. Dr. Tobias Alexander with anesthesia made aware and stated no need to notify representative for [employer].

## 2021-04-08 NOTE — Interval H&P Note (Signed)
History and Physical Interval Note:  04/08/2021 8:50 AM  Ryan [name]  has presented today for surgery, with the diagnosis of PAD and Right Heel Ulcer.  The various methods of treatment have been discussed with the patient and family. After consideration of risks, benefits and other options for treatment, the patient has consented to  Procedure(s): DEBRIDEMENT RIGHT HEEL WOUND AND FIRST AND SECOND TOES (Right) POSSIBLE RIGHT BELOW KNEE AMPUTATION (Right) as a surgical intervention.  The patient's history has been reviewed, patient examined, no change in status, stable for surgery.  I have reviewed the patient's chart and labs.  Questions were answered to the patient's satisfaction.     Servando Snare

## 2021-04-08 NOTE — Progress Notes (Signed)
Pt assigned to bed on 4E, called at 1800 to make sure nurse was ready to receive report, told they were not ready and would call back when nurse was ready. Nurse called at 1825 to receive report. Report given to Elmyra Ricks, Therapist, sports. Transport to take pt to [phone]

## 2021-04-08 NOTE — Progress Notes (Signed)
Pt arrived from short stay, VSS, oriented to unit, call light within reach, CHG complete. WU:[id number].  Chrisandra Carota, RN 04/08/2021 7:02 PM

## 2021-04-08 NOTE — H&P (Signed)
History and Physical    Ryan [name]:[id number] DOB: Apr 13, 1946 DOA: 04/08/2021  PCP: [name], MD Consultants:  Donzetta Matters - vascular; Tiller/McLean/Camnitz - cardiology Patient coming from: [place] - due for d/c on Thursday; planning to dc to [place], [place] with son and DIL; NOK: Constantinos, [place], [phone]  Chief Complaint: foot infection  HPI: Ryan [name] is a 75 y.o. male with medical history significant of afib; MVR; chronic systolic CHF with AICD in place; DM; HTN; and PVD presenting for foot debridement/possible BKA.    He has had difficulty with a heel ulcer, not healing up well.  He was coming in today for surgery to have it cleaned up, possible amputation.  Unable to do the surgery because his INR was too high.  He takes Coumadin for afib.  Some days he feels ok and some he doesn't.  He has not feeling like he had a fever.   ED Course: Dr. Donzetta Matters recommends admission, IV antibiotics, INR 3.2 so unable to do surgery today; giving FFP and plan to reschedule surgery for tomorrow.   Review of Systems: As per HPI; otherwise review of systems reviewed and negative.   Ambulatory Status:  Ambulates with a walker  COVID Vaccine Status:  Complete plus booster  Past Medical History:  Diagnosis Date   AICD (automatic cardioverter/defibrillator) present    Arthritis    Atrial fibrillation (HCC)    Cardiomyopathy, dilated (HCC)    CHF (congestive heart failure) (HCC)    Diabetes mellitus without complication ([place])    Endocarditis    Hypertension    Peripheral vascular disease (HCC)    PVC's (premature ventricular contractions)    SVT (supraventricular tachycardia)  long RP     Past Surgical History:  Procedure Laterality Date   ABDOMINAL AORTAGRAM N/A 10/03/2012   Procedure: ABDOMINAL Maxcine Ham;  Surgeon: [name], MD;  Location: [facility] CATH LAB;  Service: Cardiovascular;  Laterality: N/A;   ABDOMINAL AORTOGRAM W/LOWER EXTREMITY N/A 02/25/2021    Procedure: ABDOMINAL AORTOGRAM W/LOWER EXTREMITY;  Surgeon: Cherre Robins, MD;  Location: [place] CV LAB;  Service: Cardiovascular;  Laterality: N/A;   BYPASS GRAFT FEMORAL-PERONEAL Right 03/06/2021   Procedure: RIGHT ABOVE KNEE POPLITEAL ARTERY-PERONEAL BYPASS;  Surgeon: Cherre Robins, MD;  Location: [place];  Service: Vascular;  Laterality: Right;   CORONARY ANGIOGRAM  09/21/2012   Procedure: CORONARY ANGIOGRAM;  Surgeon: [name], MD;  Location: [facility] CATH LAB;  Service: Cardiovascular;;   EP IMPLANTABLE DEVICE N/A 06/11/2016   Procedure: ICD Implant;  Surgeon: Will Meredith Leeds, MD;  Location: [place] CV LAB;  Service: Cardiovascular;  Laterality: N/A;   EXTREMITY WIRE/PIN REMOVAL  09/14/2012   Procedure: REMOVAL K-WIRE/PIN EXTREMITY;  Surgeon: [name], MD;  Location: [place];  Service: Orthopedics;  Laterality: Right;  Right Foot   I & D EXTREMITY  09/14/2012   Procedure: IRRIGATION AND DEBRIDEMENT EXTREMITY;  Surgeon: Tennis Must, MD;  Location: [place];  Service: Orthopedics;  Laterality: Right;   INTRAOPERATIVE TRANSESOPHAGEAL ECHOCARDIOGRAM  09/26/2012   Procedure: INTRAOPERATIVE TRANSESOPHAGEAL ECHOCARDIOGRAM;  Surgeon: [name], MD;  Location: [facility] OR;  Service: Open Heart Surgery;  Laterality: N/A;   MITRAL VALVE REPLACEMENT  09/26/2012   Procedure: MITRAL VALVE (MV) REPLACEMENT;  Surgeon: [name], MD;  Location: [place] OR;  Service: Open Heart Surgery;  Laterality: N/A;   RIGHT HEART CATH N/A 08/29/2020   Procedure: RIGHT HEART CATH;  Surgeon: Loralie Champagne  S, MD;  Location: [place] CV LAB;  Service: Cardiovascular;  Laterality: N/A;   RIGHT HEART CATHETERIZATION  09/21/2012   Procedure: RIGHT HEART CATH;  Surgeon: [name], MD;  Location: [facility] CATH LAB;  Service: Cardiovascular;;   RIGHT/LEFT HEART CATH AND CORONARY ANGIOGRAPHY N/A 01/30/2021   Procedure: RIGHT/LEFT HEART CATH AND CORONARY ANGIOGRAPHY;  Surgeon: Larey Dresser, MD;  Location: [place] CV LAB;  Service: Cardiovascular;  Laterality: N/A;   SVT ABLATION N/A 09/03/2020   Procedure: SVT ABLATION;  Surgeon: Constance Haw, MD;  Location: [place] CV LAB;  Service: Cardiovascular;  Laterality: N/A;   TEE WITHOUT CARDIOVERSION  09/18/2012   Procedure: TRANSESOPHAGEAL ECHOCARDIOGRAM (TEE);  Surgeon: [name], MD;  Location: [facility] ENDOSCOPY;  Service: Cardiovascular;  Laterality: N/A;    Social History   Socioeconomic History   Marital status: Divorced    Spouse name: Not on file   Number of children: 3   Years of education: Not on file   Highest education level: Not on file  Occupational History   Not on file  Tobacco Use   Smoking status: Never   Smokeless tobacco: Never  Vaping Use   Vaping Use: Never used  Substance and Sexual Activity   Alcohol use: No    Alcohol/week: 0.0 standard drinks   Drug use: No   Sexual activity: Not on file  Other Topics Concern   Not on file  Social History Narrative   Daughter lives with him.  Retired Advertising account planner   Social Determinants of Radio broadcast assistant Strain: Not on [employer] Insecurity: No Food Insecurity   Worried About Charity fundraiser in the Last Year: Never true   Arboriculturist in the Last Year: Never true  Transportation Needs: No Data processing manager (Medical): No   Lack of Transportation (Non-Medical): No  Physical Activity: Not on file  Stress: Not on file  Social Connections: Not on file  Intimate Partner Violence: Not on file    Allergies  Allergen Reactions   Geralyn Flash [Fish Allergy] Nausea And Vomiting    Family History  Problem Relation Age of Onset   Hypertension Mother    Hypertension Father     Prior to Admission medications   Medication Sig Start Date End Date Taking? Authorizing Provider  acetaminophen (TYLENOL) 325 MG tablet Take 650 mg by mouth every 6 (six) hours as needed for mild pain or headache.   Yes [provider]   amiodarone (PACERONE) 200 MG tablet TAKE 1 TABLET BY MOUTH EVERY DAY Patient taking differently: Take 200 mg by mouth daily. 03/03/21  Yes Bhagat, Bhavinkumar, PA  atorvastatin (LIPITOR) 40 MG tablet Take 1 tablet (40 mg total) by mouth daily at 6 PM. 10/06/12  Yes Timmothy Euler, MD  bisacodyl (DULCOLAX) 5 MG EC tablet Take 5 mg by mouth daily as needed for moderate constipation.   Yes [provider]  docusate sodium (COLACE) 100 MG capsule Take 100 mg by mouth daily.   Yes [provider]  empagliflozin (JARDIANCE) 10 MG TABS tablet Take 1 tablet (10 mg total) by mouth daily. 11/11/20  Yes Larey Dresser, MD  feeding supplement (ENSURE ENLIVE / ENSURE PLUS) LIQD Take 237 mLs by mouth 2 (two) times daily between meals. 03/12/21  Yes Rhyne, Hulen Shouts, PA-C  furosemide (LASIX) 20 MG tablet Take 2 tablets (40 mg total) by mouth daily for 3 days,  THEN 2 tablets (40 mg total) as needed for edema. 04/03/21 04/06/22 Yes Milford, Maricela Bo, FNP  gabapentin (NEURONTIN) 300 MG capsule Take 300 mg by mouth at bedtime.   Yes [provider]  guaiFENesin (ROBITUSSIN) 100 MG/5ML SOLN Take 5 mLs by mouth every 4 (four) hours as needed for cough or to loosen phlegm.   Yes [provider]  HYDROcodone-acetaminophen (NORCO/VICODIN) 5-325 MG tablet Take 1 tablet by mouth 2 (two) times daily as needed for moderate pain. 03/12/21  Yes Rhyne, Hulen Shouts, PA-C  Multiple Vitamins-Minerals (CENTRUM SILVER PO) Take 1 tablet by mouth 3 (three) times a week.   Yes [provider]  mupirocin ointment (BACTROBAN) 2 % Apply 1 application topically 2 (two) times daily.   Yes [provider]  pantoprazole (PROTONIX) 40 MG tablet Take 40 mg by mouth daily.  11/12/14  Yes [provider]  triamcinolone cream (KENALOG) 0.5 % Apply 1 application topically daily as needed (skin rash).  02/15/14  Yes [provider]  warfarin (COUMADIN) 7.5 MG tablet Take 7.5 mg by  mouth daily.   Yes [provider]  potassium chloride SA (KLOR-CON) 10 MEQ tablet Take 1 tablet (10 mEq total) by mouth daily for 3 days. 04/03/21 04/06/21  Rafael Bihari, FNP    Physical Exam: Vitals:   04/08/21 0825 04/08/21 1239  BP: (!) 161/65 (!) 88/43  Pulse: 72 83  Resp: 20 18  Temp: 97.8 F (36.6 C) 98.3 F (36.8 C)  TempSrc: Oral Oral  SpO2: 100% 100%  Weight: 73.1 kg   Height: 6' (1.829 m)      General:  Appears calm and comfortable and is in NAD Eyes:  PERRL, EOMI, normal lids, iris ENT:  grossly normal hearing, lips & tongue, mmm; appropriate dentition Neck:  no LAD, masses or thyromegaly Cardiovascular:  RRR, no m/r/g.  Respiratory:   CTA bilaterally with no wheezes/rales/rhonchi.  Normal respiratory effort. Abdomen:  soft, mild diffuse TTP, ND Skin:  B feet are currently bandaged; pictures from yesterday's vascular visit are in PA Rhyne's note Musculoskeletal:  RLE with taut skin and apparent diminished vascular supply that appears to be chronic Psychiatric:  grossly normal mood and affect, speech fluent and appropriate, AOx3 Neurologic:  CN 2-12 grossly intact, moves all extremities in coordinated fashion    Radiological Exams on Admission: Independently reviewed - see discussion in A/P where applicable  VAS [place] ABI WITH/WO TBI  Result Date: 04/07/2021  [place] STUDY Patient Name:  Ryan [name]  Date of Exam:   04/07/2021 Medical Rec #: [id number]         Accession #:    [id number] Date of Birth: 18-Dec-1945         Patient Gender: M Patient Age:   75Y Exam Location:  [facility] Procedure:      VAS [place] ABI WITH/WO TBI Referring Phys: [id number] [name] HAWKEN --------------------------------------------------------------------------------  Indications: Ulceration, gangrene right great toe, and peripheral artery              disease. High Risk Factors: Hypertension, Diabetes, no history of smoking. Other Factors: Atrial  Fibrillation, CHF, SVT.  Vascular Interventions: Right above knee popliteal to peroneal artery bypass                         graft 03/06/2021. Performing Technologist: Alvia Grove RVT  Examination Guidelines: A complete evaluation includes at minimum, Doppler waveform signals and systolic  blood pressure reading at the level of bilateral brachial, anterior tibial, and posterior tibial arteries, when vessel segments are accessible. Bilateral testing is considered an integral part of a complete examination. Photoelectric Plethysmograph (PPG) waveforms and toe systolic pressure readings are included as required and additional duplex testing as needed. Limited examinations for reoccurring indications may be performed as noted.  ABI Findings: +--------+------------------+-----+----------+--------+ Right   Rt Pressure (mmHg)IndexWaveform  Comment  +--------+------------------+-----+----------+--------+ [id number]                                       +--------+------------------+-----+----------+--------+ PTA     97                0.73 biphasic           +--------+------------------+-----+----------+--------+ PERO    >254              1.92 monophasic         +--------+------------------+-----+----------+--------+ DP      164               1.24 monophasic         +--------+------------------+-----+----------+--------+ +---------+------------------+-----+----------+-------+ Left     Lt Pressure (mmHg)IndexWaveform  Comment +---------+------------------+-----+----------+-------+ Brachial 126                                      +---------+------------------+-----+----------+-------+ PTA      >254              1.92 biphasic          +---------+------------------+-----+----------+-------+ DP       >254              1.92 monophasic        +---------+------------------+-----+----------+-------+ Great Toe30                0.23 Abnormal           +---------+------------------+-----+----------+-------+ +-------+-----------+-----------+------------+------------+ ABI/TBIToday's ABIToday's TBIPrevious ABIPrevious TBI +-------+-----------+-----------+------------+------------+ Right  [place]         gangrene   254                      +-------+-----------+-----------+------------+------------+ Left   254        0.23                                +-------+-----------+-----------+------------+------------+   Summary: Right: Resting right ankle-brachial index indicates noncompressible right lower extremity arteries, unable to obtain toe-brachial index due to gangrene great toe. . Left: Resting left ankle-brachial index indicates noncompressible left lower extremity arteries. The left toe-brachial index is abnormal.  *See table(s) above for measurements and observations.  Electronically signed by Monica Martinez MD on 04/07/2021 at 4:22:07 PM.    Final    VAS [place] LOWER EXTREMITY ARTERIAL DUPLEX  Result Date: 04/07/2021 LOWER EXTREMITY ARTERIAL DUPLEX STUDY Patient Name:  Ryan [name]  Date of Exam:   04/07/2021 Medical Rec #: [id number]         Accession #:    [id number] Date of Birth: 15-May-1946         Patient Gender: M Patient Age:   75Y Exam Location:  [facility] Procedure:      VAS [place] LOWER EXTREMITY ARTERIAL DUPLEX Referring Phys: [id number]  N HAWKEN --------------------------------------------------------------------------------  Indications: Ulceration, and peripheral artery disease. High Risk Factors: Hypertension, Diabetes.  Vascular Interventions: Right above knee to below knee bypass graft 03/06/2021. Current ABI:            Right ABI [place], Left [place] Performing Technologist: Alvia Grove RVT  Examination Guidelines: A complete evaluation includes B-mode imaging, spectral Doppler, color Doppler, and power Doppler as needed of all accessible portions of each vessel. Bilateral testing is considered an integral part of a  complete examination. Limited examinations for reoccurring indications may be performed as noted.  +----------+--------+-----+--------+---------+-------------------------+ RIGHT     PSV cm/sRatioStenosisWaveform Comments                  +----------+--------+-----+--------+---------+-------------------------+ CFA Prox  72                   triphasic                          +----------+--------+-----+--------+---------+-------------------------+ CFA Distal46                   triphasic                          +----------+--------+-----+--------+---------+-------------------------+ DFA       33                   biphasic                           +----------+--------+-----+--------+---------+-------------------------+ SFA Prox  59                   biphasic                           +----------+--------+-----+--------+---------+-------------------------+ SFA Mid   77                   triphasicdampened                  +----------+--------+-----+--------+---------+-------------------------+ SFA Distal104                  triphasicdampened , proximal graft +----------+--------+-----+--------+---------+-------------------------+  Right Graft #1: +------------------+--------+--------+----------+--------+                   PSV cm/sStenosisWaveform  Comments +------------------+--------+--------+----------+--------+ Inflow            112             monophasic         +------------------+--------+--------+----------+--------+ Prox Anastomosis  60              monophasic         +------------------+--------+--------+----------+--------+ Proximal Graft    148             monophasicbrisk    +------------------+--------+--------+----------+--------+ Mid Graft         88              monophasicbrisk    +------------------+--------+--------+----------+--------+ Distal Graft      51              monophasic          +------------------+--------+--------+----------+--------+ Distal Anastomosis73              monophasic         +------------------+--------+--------+----------+--------+ Outflow  73              monophasic         +------------------+--------+--------+----------+--------+   Summary: Right: Patent above knee to below knee bypass graft with no visualized stenosis.  See table(s) above for measurements and observations. Electronically signed by Monica Martinez MD on 04/07/2021 at 3:40:41 PM.    Final     EKG: not done   Labs on Admission: I have personally reviewed the available labs and imaging studies at the time of the admission.  Pertinent labs:   BUN 19/Creatinine 1.60/GFR 44 - stable Albumin 3.2 WBC 6.4 Hgb 10.1 INR 3.2 UA: >500 glucose, small LE, rare bacteria, >50 WBC MRSA negative COVID negative   Assessment/Plan Principal Problem:   Critical lower limb ischemia (HCC) Active Problems:   Chronic systolic heart failure (HCC)   Status post mitral valve replacement with bioprosthetic valve   Diabetes mellitus (HCC)   Essential hypertension   ICD (implantable cardioverter-defibrillator) in place   Stage 3 chronic kidney disease (HCC)   R foot wounds with critical limb ischemia -Patient is s/p pop-peroneal bypass on 6/22 -Ongoing R foot wound infection -His entire leg appears to be chronically hypoperfused on exam -He was seen yesterday with concern for infection, started on Clinda at his facility -He was scheduled for debridement and possible BKA for today but INR is too elevated -Instead, will admit for now, reverse, and plan OR tomorrow -He also has a L great toe wound and will have anteriogram on the left during this hospitalization, as well -No current concerns for sepsis -Will treat with IV antibiotics (Rocephin and Flagyl as per the lower extremity wound infection algorithm) -Patient is NPO after midnight -LE wound order set utilized including  labs (CRP, ESR, A1c, prealbumin, HIV, and blood cultures) and consults (diabetes coordinator; peripheral vascular navigator; TOC team; wound care; and nutrition)   Chronic systolic CHF with AICD -04/14/35 echo with EF 40-45% -Appears euvolemic at this time -Hold Lasix for now  Afib/MVR -Bioprosthetic valve -On Coumadin, did not apparently come off pre-procedure -INR is 3.2 -Given FFP by surgery for reversal, will plan to consult pharmacy to start heparin bridge when appropriate -Rate control with Amiodarone  DM -Will check A1c -hold Jardiance -Cover with moderate-scale SSI  -Continue Neurontin for neuropathy  HTN -Hypotension while in short stay -Suspect this is his baseline since he is not on BP-lowering medications including ACE/BB -In review of last hospitalization, he was stopped from BP medications and started on Midodrine - which may need to be added back  HLD -Continue Lipitor -03/07/21 lipids with LDL at goal (59)  Stage 3b CKD -Appears to be stable at this time    Note: This patient has been tested and is negative for the novel coronavirus COVID-19. The patient has been fully vaccinated against COVID-19.   Level of care: Telemetry Medical DVT prophylaxis:  Reversing INR and then heparin per pharmacy Code Status:  Full - confirmed with patient Family Communication: None present Disposition Plan:  The patient is from: SNF rehab  Anticipated d/c is to: likely to need to return to SNF rehab - particularly if he has a BKA  Anticipated d/c date will depend on clinical response to treatment, likely 2-3+ days  Patient is currently: acutely ill Consults called: Vascular surgery; pharmacy; diabetes coordinator; peripheral vascular navigator; TOC team; wound care; and nutrition Admission status:  Admit - It is my clinical opinion that admission to INPATIENT is reasonable and necessary because  of the expectation that this patient will require hospital care that crosses at  least 2 midnights to treat this condition based on the medical complexity of the problems presented.  Given the aforementioned information, the predictability of an adverse outcome is felt to be significant.    Karmen Bongo MD [facility]   How to contact the [facility] Attending or Consulting provider [place] or covering provider during after hours [place], for this patient?  Check the care team in [facility] and look for a) attending/consulting TRH provider listed and b) the [facility] team listed Log into [email or web address] and use [place]'s universal password to access. If you do not have the password, please contact the hospital operator. Locate the [facility] provider you are looking for under [facility] and page to a number that you can be directly reached. If you still have difficulty reaching the provider, please page the [facility] (Director on Call) for the Hospitalists listed on amion for assistance.   04/08/2021, 1:12 PM

## 2021-04-08 NOTE — Consult Note (Signed)
[facility] [facility] Nurse wound consult note Consultation was completed by review of records, images and assistance from the bedside nurse/clinical staff.    Reason for Consult:Lower extremity wound; noted patient admitted for right LE wounds and is headed to the OR for debridement and/or possible amputations  Wound type: Full thickness ulceration; left heel Black skin changes; left great toe Patient known to have PAD and history of DM and osteomyelitis  Pressure Injury POA: Yes Measurement:see nursing flowsheets; patient has been admitted for surgery and went straight to short stay Wound bed:100% pink, clean heel wound; dark black intact skin left great toe Drainage (amount, consistency, odor) unable to assess  Periwound: dry peeling skin on the left heel consistent with neuropathic changes of the foot related to DM Dressing procedure/placement/frequency:  Silicone foam to the left heel, change every 3 days  No dressings needed for the left great toe.    Re consult if needed, will not follow at this time. Thanks  Prentice Sackrider [employer], RN,CWOCN, CNS, [place] [phone])

## 2021-04-08 NOTE — Progress Notes (Signed)
Called Dr. Donzetta Matters to notify that patient's labs today- PT resulted 33.1 and INR resulted 3.2. No further orders received at this time.

## 2021-04-08 NOTE — Progress Notes (Signed)
[place] DEVICE PROGRAMMING  Patient Information: Name:  Ryan [name]  DOB:  09-15-1946  MRN:  [id number]    Derl Barrow, RN  P Cv Div Heartcare Device Planned Procedure:  Debridement of right heel, first and second toe possible right below the knee amputation  Surgeon:  Dr. Donzetta Matters  Date of Procedure:  04/08/21  Cautery will be used.  Position during surgery:  supine   Please send documentation back to:  [facility] (Fax # [phone])   Sheela Stack, RN  04/07/2021 6:40 PM  Device Information:  Clinic EP Physician:  Allegra Lai, MD   Device Type:  Defibrillator Manufacturer and Phone #:  [employer]: [id number] Pacemaker Dependent?:  No. Date of Last Device Check:  03/27/21 Normal Device Function?:  Yes.    Electrophysiologist's Recommendations:  Have magnet available. Provide continuous ECG monitoring when magnet is used or reprogramming is to be performed.  Procedure should not interfere with device function.  No device programming or magnet placement needed.  Per Device Clinic Standing Orders, York Ram, RN  2:35 PM 04/08/2021

## 2021-04-08 NOTE — Progress Notes (Signed)
1630 no bed avail in any medical tele floor until now per bed placement. AC Lydia made aware.

## 2021-04-09 ENCOUNTER — Encounter (HOSPITAL_COMMUNITY)
Admission: RE | Disposition: A | Discharge status: Rehabilitation Facility | Payer: Self-pay | Source: Home / Self Care | Attending: Internal Medicine

## 2021-04-09 ENCOUNTER — Inpatient Hospital Stay (HOSPITAL_COMMUNITY): Payer: Medicare HMO | Admitting: Anesthesiology

## 2021-04-09 ENCOUNTER — Inpatient Hospital Stay (HOSPITAL_COMMUNITY): Admission: RE | Admit: 2021-04-09 | Payer: Medicare HMO | Source: Home / Self Care | Admitting: Vascular Surgery

## 2021-04-09 ENCOUNTER — Encounter (HOSPITAL_COMMUNITY): Payer: Self-pay | Admitting: Internal Medicine

## 2021-04-09 DIAGNOSIS — L97519 Non-pressure chronic ulcer of other part of right foot with unspecified severity: Secondary | ICD-10-CM

## 2021-04-09 DIAGNOSIS — L97419 Non-pressure chronic ulcer of right heel and midfoot with unspecified severity: Secondary | ICD-10-CM

## 2021-04-09 DIAGNOSIS — I96 Gangrene, not elsewhere classified: Secondary | ICD-10-CM

## 2021-04-09 DIAGNOSIS — I70229 Atherosclerosis of native arteries of extremities with rest pain, unspecified extremity: Secondary | ICD-10-CM | POA: Diagnosis not present

## 2021-04-09 HISTORY — PX: WOUND DEBRIDEMENT: SHX247

## 2021-04-09 HISTORY — PX: APPLICATION OF WOUND VAC: SHX5189

## 2021-04-09 LAB — BPAM FFP
Blood Product Expiration Date: 202207242359
Blood Product Expiration Date: 202207242359
ISSUE DATE / TIME: 202207201820
Unit Type and Rh: 6200
Unit Type and Rh: 6200

## 2021-04-09 LAB — BASIC METABOLIC PANEL
Anion gap: 7 (ref 5–15)
BUN: 19 mg/dL (ref 8–23)
CO2: 22 mmol/L (ref 22–32)
Calcium: 8.7 mg/dL — ABNORMAL LOW (ref 8.9–10.3)
Chloride: 110 mmol/L (ref 98–111)
Creatinine, Ser: 1.46 mg/dL — ABNORMAL HIGH (ref 0.61–1.24)
GFR, Estimated: 49 mL/min — ABNORMAL LOW (ref >60)
Glucose, Bld: 97 mg/dL (ref 70–99)
Potassium: 4.2 mmol/L (ref 3.5–5.1)
Sodium: 139 mmol/L (ref 135–145)

## 2021-04-09 LAB — CBC
HCT: 30 % — ABNORMAL LOW (ref 39.0–52.0)
Hemoglobin: 9 g/dL — ABNORMAL LOW (ref 13.0–17.0)
MCH: 23.9 pg — ABNORMAL LOW (ref 26.0–34.0)
MCHC: 30 g/dL (ref 30.0–36.0)
MCV: 79.6 fL — ABNORMAL LOW (ref 80.0–100.0)
Platelets: 230 10*3/uL (ref 150–400)
RBC: 3.77 MIL/uL — ABNORMAL LOW (ref 4.22–5.81)
RDW: 17.9 % — ABNORMAL HIGH (ref 11.5–15.5)
WBC: 6.3 10*3/uL (ref 4.0–10.5)
nRBC: 0 % (ref 0.0–0.2)

## 2021-04-09 LAB — PREPARE FRESH FROZEN PLASMA: Unit division: 0

## 2021-04-09 LAB — GLUCOSE, CAPILLARY
Glucose-Capillary: 95 mg/dL (ref 70–99)
Glucose-Capillary: 89 mg/dL (ref 70–99)
Glucose-Capillary: 85 mg/dL (ref 70–99)
Glucose-Capillary: 91 mg/dL (ref 70–99)
Glucose-Capillary: 185 mg/dL — ABNORMAL HIGH (ref 70–99)

## 2021-04-09 LAB — PROTIME-INR
INR: 1.8 — ABNORMAL HIGH (ref 0.8–1.2)
Prothrombin Time: 21.3 seconds — ABNORMAL HIGH (ref 11.4–15.2)

## 2021-04-09 LAB — HEPARIN LEVEL (UNFRACTIONATED): Heparin Unfractionated: <0.1 IU/mL — ABNORMAL LOW (ref 0.30–0.70)

## 2021-04-09 SURGERY — DEBRIDEMENT, WOUND
Anesthesia: General | Site: Foot | Laterality: Right

## 2021-04-09 MED ORDER — CHLORHEXIDINE GLUCONATE 0.12 % MT SOLN
OROMUCOSAL | Status: AC
  Administered 2021-04-09: 15 mL via OROMUCOSAL
  Filled 2021-04-09: qty 15

## 2021-04-09 MED ORDER — 0.9 % SODIUM CHLORIDE (POUR BTL) OPTIME
TOPICAL | Status: DC | PRN
  Administered 2021-04-09: 1000 mL

## 2021-04-09 MED ORDER — HEPARIN (PORCINE) 25000 UT/250ML-% IV SOLN
1200.0000 [IU]/h | INTRAVENOUS | Status: DC
  Administered 2021-04-09: 1200 [IU]/h via INTRAVENOUS
  Filled 2021-04-09: qty 250

## 2021-04-09 MED ORDER — ADULT MULTIVITAMIN W/MINERALS CH
1.0000 | ORAL_TABLET | Freq: Every day | ORAL | Status: DC
  Administered 2021-04-10 – 2021-04-20 (×10): 1 via ORAL
  Filled 2021-04-09 (×10): qty 1

## 2021-04-09 MED ORDER — OXYCODONE HCL 5 MG PO TABS: 5.0000 mg | ORAL_TABLET | Freq: Once | ORAL | Status: DC | PRN

## 2021-04-09 MED ORDER — JUVEN PO PACK
1.0000 | PACK | Freq: Two times a day (BID) | ORAL | Status: DC
  Administered 2021-04-10 – 2021-04-19 (×13): 1 via ORAL
  Filled 2021-04-09 (×14): qty 1

## 2021-04-09 MED ORDER — HEPARIN (PORCINE) 25000 UT/250ML-% IV SOLN
1800.0000 [IU]/h | INTRAVENOUS | Status: DC
  Administered 2021-04-09 – 2021-04-11 (×3): 1200 [IU]/h via INTRAVENOUS
  Administered 2021-04-12 – 2021-04-13 (×2): 1350 [IU]/h via INTRAVENOUS
  Administered 2021-04-14: 1750 [IU]/h via INTRAVENOUS
  Administered 2021-04-15 – 2021-04-16 (×2): 1800 [IU]/h via INTRAVENOUS
  Filled 2021-04-09 (×10): qty 250

## 2021-04-09 MED ORDER — LIDOCAINE 2% (20 MG/ML) 5 ML SYRINGE
INTRAMUSCULAR | Status: DC | PRN
  Administered 2021-04-09: 60 mg via INTRAVENOUS

## 2021-04-09 MED ORDER — FENTANYL CITRATE (PF) 100 MCG/2ML IJ SOLN: 25.0000 ug | INTRAMUSCULAR | Status: DC | PRN

## 2021-04-09 MED ORDER — OXYCODONE HCL 5 MG/5ML PO SOLN: 5.0000 mg | Freq: Once | ORAL | Status: DC | PRN

## 2021-04-09 MED ORDER — CHLORHEXIDINE GLUCONATE 0.12 % MT SOLN: 15.0000 mL | Freq: Once | OROMUCOSAL | Status: AC

## 2021-04-09 MED ORDER — HYDROCODONE-ACETAMINOPHEN 5-325 MG PO TABS
1.0000 | ORAL_TABLET | ORAL | Status: DC | PRN
  Administered 2021-04-09 – 2021-04-10 (×7): 1 via ORAL
  Administered 2021-04-11 (×3): 2 via ORAL
  Administered 2021-04-11: 1 via ORAL
  Administered 2021-04-12 – 2021-04-18 (×17): 2 via ORAL
  Administered 2021-04-19: 1 via ORAL
  Filled 2021-04-09 (×2): qty 1
  Filled 2021-04-09 (×7): qty 2
  Filled 2021-04-09: qty 1
  Filled 2021-04-09 (×3): qty 2
  Filled 2021-04-09 (×3): qty 1
  Filled 2021-04-09 (×8): qty 2
  Filled 2021-04-09 (×3): qty 1
  Filled 2021-04-09 (×2): qty 2
  Filled 2021-04-09: qty 1

## 2021-04-09 MED ORDER — PHENYLEPHRINE HCL-NACL 10-0.9 MG/250ML-% IV SOLN
INTRAVENOUS | Status: DC | PRN
  Administered 2021-04-09: 50 ug/min via INTRAVENOUS

## 2021-04-09 MED ORDER — ENSURE MAX PROTEIN PO LIQD
11.0000 [oz_av] | Freq: Every day | ORAL | Status: DC
  Administered 2021-04-10 – 2021-04-19 (×10): 11 [oz_av] via ORAL
  Filled 2021-04-09 (×11): qty 330

## 2021-04-09 MED ORDER — LACTATED RINGERS IV SOLN: INTRAVENOUS | Status: DC

## 2021-04-09 MED ORDER — DEXAMETHASONE SODIUM PHOSPHATE 10 MG/ML IJ SOLN
INTRAMUSCULAR | Status: DC | PRN
  Administered 2021-04-09: 5 mg via INTRAVENOUS

## 2021-04-09 MED ORDER — FENTANYL CITRATE (PF) 250 MCG/5ML IJ SOLN
INTRAMUSCULAR | Status: DC | PRN
  Administered 2021-04-09 (×2): 50 ug via INTRAVENOUS

## 2021-04-09 MED ORDER — PROPOFOL 10 MG/ML IV BOLUS
INTRAVENOUS | Status: DC | PRN
  Administered 2021-04-09: 50 mg via INTRAVENOUS
  Administered 2021-04-09: 150 mg via INTRAVENOUS

## 2021-04-09 MED ORDER — ONDANSETRON HCL 4 MG/2ML IJ SOLN
INTRAMUSCULAR | Status: DC | PRN
  Administered 2021-04-09: 4 mg via INTRAVENOUS

## 2021-04-09 MED ORDER — ORAL CARE MOUTH RINSE: 15.0000 mL | Freq: Once | OROMUCOSAL | Status: AC

## 2021-04-09 MED ORDER — ONDANSETRON HCL 4 MG/2ML IJ SOLN
4.0000 mg | Freq: Four times a day (QID) | INTRAMUSCULAR | Status: DC | PRN
Start: 2021-04-09 — End: 2021-04-09

## 2021-04-09 SURGICAL SUPPLY — 62 items
APL PRP STRL LF DISP 70% ISPRP (MISCELLANEOUS) ×2
BAG COUNTER SPONGE SURGICOUNT (BAG) ×3 IMPLANT
BAG SPNG CNTER NS LX DISP (BAG) ×2
BLADE LONG MED 31X9 (MISCELLANEOUS) IMPLANT
BLADE SAGITTAL (BLADE)
BLADE SAGITTAL 25.0X1.19X90 (BLADE) IMPLANT
BLADE SAW GIGLI 510 (BLADE) ×3 IMPLANT
BLADE SAW THK.89X75X18XSGTL (BLADE) IMPLANT
BLADE SURG 21 STRL SS (BLADE) ×3 IMPLANT
BNDG CMPR MED 15X6 ELC VLCR LF (GAUZE/BANDAGES/DRESSINGS) ×2
BNDG COHESIVE 6X5 TAN STRL LF (GAUZE/BANDAGES/DRESSINGS) ×1 IMPLANT
BNDG ELASTIC 4X5.8 VLCR STR LF (GAUZE/BANDAGES/DRESSINGS) ×2 IMPLANT
BNDG ELASTIC 6X15 VLCR STRL LF (GAUZE/BANDAGES/DRESSINGS) ×2 IMPLANT
BNDG ELASTIC 6X5.8 VLCR STR LF (GAUZE/BANDAGES/DRESSINGS) ×2 IMPLANT
BNDG GAUZE ELAST 4 BULKY (GAUZE/BANDAGES/DRESSINGS) ×2 IMPLANT
CANISTER SUCT 3000ML PPV (MISCELLANEOUS) ×3 IMPLANT
CHLORAPREP W/TINT 26 (MISCELLANEOUS) ×3 IMPLANT
CLIP VESOCCLUDE MED 6/CT (CLIP) IMPLANT
CONNECTOR Y ATS VAC SYSTEM (MISCELLANEOUS) ×2 IMPLANT
COVER SURGICAL LIGHT HANDLE (MISCELLANEOUS) ×3 IMPLANT
DRAIN CHANNEL 19F RND (DRAIN) IMPLANT
DRAPE HALF SHEET 40X57 (DRAPES) ×3 IMPLANT
DRAPE ORTHO SPLIT 77X108 STRL (DRAPES) ×6
DRAPE SURG ORHT 6 SPLT 77X108 (DRAPES) ×4 IMPLANT
DRSG CUTIMED SORBACT 7X9 (GAUZE/BANDAGES/DRESSINGS) ×2 IMPLANT
DRSG VAC ATS SM SENSATRAC (GAUZE/BANDAGES/DRESSINGS) ×2 IMPLANT
DRSG XEROFORM 1X8 (GAUZE/BANDAGES/DRESSINGS) ×2 IMPLANT
ELECT REM PT RETURN 9FT ADLT (ELECTROSURGICAL) ×3
ELECTRODE REM PT RTRN 9FT ADLT (ELECTROSURGICAL) ×2 IMPLANT
EVACUATOR SILICONE 100CC (DRAIN) IMPLANT
GAUZE SPONGE 4X4 12PLY STRL (GAUZE/BANDAGES/DRESSINGS) ×3 IMPLANT
GAUZE XEROFORM 5X9 LF (GAUZE/BANDAGES/DRESSINGS) ×5 IMPLANT
GLOVE SURG POLYISO LF SZ8 (GLOVE) ×3 IMPLANT
GOWN STRL REUS W/ TWL LRG LVL3 (GOWN DISPOSABLE) ×4 IMPLANT
GOWN STRL REUS W/ TWL XL LVL3 (GOWN DISPOSABLE) ×2 IMPLANT
GOWN STRL REUS W/TWL LRG LVL3 (GOWN DISPOSABLE) ×6
GOWN STRL REUS W/TWL XL LVL3 (GOWN DISPOSABLE) ×3
GRAFT MYRIAD 3 LAYER 7X10 (Graft) ×2 IMPLANT
KIT BASIN OR (CUSTOM PROCEDURE TRAY) ×3 IMPLANT
KIT TURNOVER KIT B (KITS) ×3 IMPLANT
NS IRRIG 1000ML POUR BTL (IV SOLUTION) ×3 IMPLANT
PACK GENERAL/GYN (CUSTOM PROCEDURE TRAY) ×3 IMPLANT
PAD ARMBOARD 7.5X6 YLW CONV (MISCELLANEOUS) ×6 IMPLANT
PENCIL SMOKE EVACUATOR (MISCELLANEOUS) ×3 IMPLANT
STAPLER SKIN 35 REG (STAPLE) ×3 IMPLANT
STAPLER VISISTAT 35W (STAPLE) ×3 IMPLANT
STOCKINETTE IMPERVIOUS LG (DRAPES) ×3 IMPLANT
SUT BONE WAX W31G (SUTURE) IMPLANT
SUT ETHILON 2 0 FS 18 (SUTURE) ×6 IMPLANT
SUT ETHILON 2 0 PSLX (SUTURE) ×6 IMPLANT
SUT ETHILON 3 0 PS 1 (SUTURE) IMPLANT
SUT MON AB 2-0 CT1 36 (SUTURE) ×4 IMPLANT
SUT SILK 0 TIES 10X30 (SUTURE) ×3 IMPLANT
SUT SILK 2 0 (SUTURE) ×3
SUT SILK 2 0 SH CR/8 (SUTURE) ×3 IMPLANT
SUT SILK 2-0 18XBRD TIE 12 (SUTURE) ×2 IMPLANT
SUT SILK 3 0 (SUTURE)
SUT SILK 3-0 18XBRD TIE 12 (SUTURE) IMPLANT
SUT VIC AB 2-0 CT1 18 (SUTURE) ×6 IMPLANT
TOWEL GREEN STERILE (TOWEL DISPOSABLE) ×6 IMPLANT
UNDERPAD 30X36 HEAVY ABSORB (UNDERPADS AND DIAPERS) ×3 IMPLANT
WATER STERILE IRR 1000ML POUR (IV SOLUTION) ×3 IMPLANT

## 2021-04-09 NOTE — Progress Notes (Signed)
ANTICOAGULATION CONSULT NOTE - Initial Consult  Pharmacy Consult for heparin Indication: atrial fibrillation  Allergies  Allergen Reactions   Tuna [Fish Allergy] Nausea And Vomiting    Patient Measurements: Height: 6' (182.9 cm) Weight: 73.1 kg (161 lb 2.5 oz) IBW/kg (Calculated) : 77.6 Heparin Dosing Weight: 70kg  Vital Signs: Temp: 98.5 F (36.9 C) (07/21 0334) Temp Source: Oral (07/21 0334) BP: 116/75 (07/21 0334) Pulse Rate: 73 (07/21 0334)  Labs: Recent Labs    04/08/21 0826 04/08/21 2044 04/09/21 0139 04/09/21 0340  HGB 10.1*  --  9.0*  --   HCT 35.3*  --  30.0*  --   PLT 269  --  230  --   APTT 47*  --   --   --   LABPROT 33.1* 25.5*  --  21.3*  INR 3.2* 2.3*  --  1.8*  CREATININE 1.60*  --  1.46*  --     Estimated Creatinine Clearance: 43.8 mL/min (A) (by C-G formula based on SCr of 1.46 mg/dL (H)).   Medical History: Past Medical History:  Diagnosis Date   AICD (automatic cardioverter/defibrillator) present    Arthritis    Atrial fibrillation (HCC)    Cardiomyopathy, dilated (HCC)    CHF (congestive heart failure) ([place])    Diabetes mellitus without complication ([place])    Endocarditis    Hypertension    Peripheral vascular disease (HCC)    PVC's (premature ventricular contractions)    SVT (supraventricular tachycardia)  long RP     Assessment: 75yo male to start heparin bridge for Afib while INR subtherapeutic while awaiting surgery.  Goal of Therapy:  Heparin level 0.3-0.7 units/ml Monitor platelets by anticoagulation protocol: Yes   Plan:  Heparin 1200 units/hr (based on recent heparin requirements) and monitor heparin levels and CBC.  Wynona Neat, PharmD, BCPS  04/09/2021,7:05 AM

## 2021-04-09 NOTE — Transfer of Care (Signed)
Immediate Anesthesia Transfer of Care Note  Patient: Ryan [name]  Procedure(s) Performed: DEBRIDEMENT RIGHT HEEL WOUND AND PARTIAL FIRST TOE AMPUTATION AND SECOND TOE AMPUTATION. Application of Myriad skin substitute (Right: Foot) APPLICATION OF WOUND VAC (Right: Foot)  Patient Location: PACU  Anesthesia Type:General  Level of Consciousness: awake and alert   Airway & Oxygen Therapy: Patient Spontanous Breathing  Post-op Assessment: Report given to RN and Post -op Vital signs reviewed and stable  Post vital signs: Reviewed and stable  Last Vitals:  Vitals Value Taken Time  BP 145/72 04/09/21 1547  Temp    Pulse 71 04/09/21 1548  Resp 14 04/09/21 1548  SpO2 100 % 04/09/21 1548  Vitals shown include unvalidated device data.  Last Pain:  Vitals:   04/09/21 1241  TempSrc: Oral  PainSc: 3       Patients Stated Pain Goal: 3 (72/27/73 7505)  Complications: No notable events documented.

## 2021-04-09 NOTE — Anesthesia Preprocedure Evaluation (Signed)
Anesthesia Evaluation  Patient identified by MRN, date of birth, ID band Patient awake    Reviewed: Allergy & Precautions, H&P , NPO status , Patient's Chart, lab work & pertinent test results  Airway Mallampati: II   Neck ROM: full    Dental   Pulmonary neg pulmonary ROS,    breath sounds clear to auscultation       Cardiovascular hypertension, + Peripheral Vascular Disease and +CHF  + Cardiac Defibrillator  Rhythm:regular Rate:Normal  H/o mitral valve replacement  TTE (02/2021): EF 45%   Neuro/Psych CVA    GI/Hepatic   Endo/Other  diabetes, Type 2  Renal/GU Renal InsufficiencyRenal disease     Musculoskeletal   Abdominal   Peds  Hematology  (+) anemia ,   Anesthesia Other Findings   Reproductive/Obstetrics                             Anesthesia Physical Anesthesia Plan  ASA: 4  Anesthesia Plan: General   Post-op Pain Management:    Induction: Intravenous  PONV Risk Score and Plan: 2 and Ondansetron, Dexamethasone and Treatment may vary due to age or medical condition  Airway Management Planned: LMA  Additional Equipment:   Intra-op Plan:   Post-operative Plan: Extubation in OR  Informed Consent: I have reviewed the patients History and Physical, chart, labs and discussed the procedure including the risks, benefits and alternatives for the proposed anesthesia with the patient or authorized representative who has indicated his/her understanding and acceptance.     Dental advisory given  Plan Discussed with: CRNA, Anesthesiologist and Surgeon  Anesthesia Plan Comments:         Anesthesia Quick Evaluation

## 2021-04-09 NOTE — Progress Notes (Addendum)
Pt received from PACU. VSS. R leg drsg clean dry and intact. Wound vac with good seal. Dinner tray ordered. Order clarified with Dr. [name] to restart heparin gtt at 2000.  Clyde Canterbury, RN

## 2021-04-09 NOTE — Progress Notes (Signed)
PROGRESS NOTE    Ryan [name]  [id number] DOB: [birth year] DOA: 04/08/2021 PCP: [name], MD   Brief Narrative:  HPI: Ryan [name] is a 75 y.o. male with medical history significant of afib; MVR; chronic systolic CHF with AICD in place; DM; HTN; and PVD presenting for foot debridement/possible BKA.    He has had difficulty with a heel ulcer, not healing up well.  He was coming in today for surgery to have it cleaned up, possible amputation.  Unable to do the surgery because his INR was too high.  He takes Coumadin for afib.  Some days he feels ok and some he doesn't.  He has not feeling like he had a fever.     ED Course: Dr. Donzetta Matters recommends admission, IV antibiotics, INR 3.2 so unable to do surgery today; giving FFP and plan to reschedule surgery for tomorrow.   Assessment & Plan:   Principal Problem:   Critical lower limb ischemia (HCC) Active Problems:   Chronic systolic heart failure (HCC)   Status post mitral valve replacement with bioprosthetic valve   Diabetes mellitus ([place])   Essential hypertension   ICD (implantable cardioverter-defibrillator) in place   Stage 3 chronic kidney disease ([place])  R foot wounds with critical limb ischemia: Patient is s/p pop-peroneal bypass on 6/22. Ongoing R foot wound infection. He was seen day before admission with concern for infection, started on [place] at his facility. He was scheduled for debridement and possible BKA on 04/08/2021 however due to elevated INR, he was instead admitted under hospitalist service with the plan to do debridement by vascular surgery on 04/09/2021.  Continue current antibiotics and defer further management to vascular surgery.   Chronic systolic CHF with AICD: Euvolemic.  Hold Lasix. -03/04/21 echo with EF 40-45%   Afib/MVR -Bioprosthetic valve -On Coumadin, did not apparently come off pre-procedure -INR less than 2 today.  Received FFP on the day of admission for reversal. -Rate control with Amiodarone.     DM type II: Hemoglobin A1c only 5.4.  Hold Jardiance and continue SSI.  Controlled.  Essential HTN: In review of last hospitalization, he was stopped from BP medications and started on Midodrine - which may need to be added back since blood pressure is labile.   HLD -Continue Lipitor   Stage 3b CKD: Better than his baseline.  DVT prophylaxis:   Heparin drip.   Code Status: Full Code  Family Communication:  None present at bedside.  Plan of care discussed with patient in length and he verbalized understanding and agreed with it.  Status is: Inpatient  Remains inpatient appropriate because:Ongoing diagnostic testing needed not appropriate for outpatient work up  Dispo: The patient is from: Home              Anticipated d/c is to: Home              Patient currently is not medically stable to d/c.   Difficult to place patient No        Estimated body mass index is 21.86 kg/m as calculated from the following:   Height as of this encounter: 6' (1.829 m).   Weight as of this encounter: 73.1 kg.      Nutritional status:               Consultants:  Vascular surgery  Procedures:  None  Antimicrobials:  Anti-infectives (From admission, onward)    Start     Dose/Rate Route Frequency Ordered Stop  04/08/21 2000  [MAR Hold]  cefTRIAXone (ROCEPHIN) 2 g in sodium chloride 0.9 % 100 mL IVPB        (MAR Hold since Thu 04/09/2021 at 1227.Hold Reason: Transfer to a Procedural area)  See Hyperspace for full Linked Orders Report.   2 g 200 mL/hr over 30 Minutes Intravenous Every 24 hours 04/08/21 1212 04/15/21 1959   04/08/21 2000  [MAR Hold]  metroNIDAZOLE (FLAGYL) IVPB 500 mg        (MAR Hold since Thu 04/09/2021 at 1227.Hold Reason: Transfer to a Procedural area)  See Hyperspace for full Linked Orders Report.   500 mg 100 mL/hr over 60 Minutes Intravenous Every 8 hours 04/08/21 1212 04/15/21 1959   04/08/21 0823  ceFAZolin (ANCEF) IVPB 2g/100 mL premix  Status:   Discontinued        2 g 200 mL/hr over 30 Minutes Intravenous 30 min pre-op 04/08/21 [id number] 04/08/21 1212          Subjective: Patient seen and examined this morning.  He had no complaint.  Pain controlled.  Objective: Vitals:   04/08/21 2338 04/09/21 0334 04/09/21 0730 04/09/21 1241  BP: (!) 124/58 116/75 134/61 (!) 155/68  Pulse: 70 73 67 78  Resp: '18 16 17 16  '$ Temp: 98.4 F (36.9 C) 98.5 F (36.9 C) 98.2 F (36.8 C) 98.5 F (36.9 C)  TempSrc: Oral Oral Oral Oral  SpO2: 100% 95% 95% 99%  Weight:      Height:        Intake/Output Summary (Last 24 hours) at 04/09/2021 1403 Last data filed at 04/09/2021 0800 Gross per 24 hour  Intake 1087.3 ml  Output 625 ml  Net 462.3 ml   Filed Weights   04/08/21 0825  Weight: 73.1 kg    Examination:  General exam: Appears calm and comfortable  Respiratory system: Clear to auscultation. Respiratory effort normal. Cardiovascular system: S1 & S2 heard, RRR. No JVD, murmurs, rubs, gallops or clicks. No pedal edema. Gastrointestinal system: Abdomen is nondistended, soft and nontender. No organomegaly or masses felt. Normal bowel sounds heard. Central nervous system: Alert and oriented. No focal neurological deficits. Extremities: Symmetric 5 x 5 power. Skin: Dressing in right foot not removed. Psychiatry: Judgement and insight appear normal. Mood & affect appropriate.    Data Reviewed: I have personally reviewed following labs and imaging studies  CBC: Recent Labs  Lab 04/08/21 0826 04/09/21 0139  WBC 6.4 6.3  HGB 10.1* 9.0*  HCT 35.3* 30.0*  MCV 81.9 79.6*  PLT 269 [id number]   Basic Metabolic Panel: Recent Labs  Lab 04/03/21 1118 04/08/21 0826 04/09/21 0139  NA 142 140 139  K 3.7 4.1 4.2  CL 108 105 110  CO2 '26 26 22  '$ GLUCOSE 96 97 97  BUN '20 19 19  '$ CREATININE 1.47* 1.60* 1.46*  CALCIUM 9.1 9.2 8.7*   GFR: Estimated Creatinine Clearance: 43.8 mL/min (A) (by C-G formula based on SCr of 1.46 mg/dL (H)). Liver  Function Tests: Recent Labs  Lab 04/08/21 0826  AST 22  ALT 18  ALKPHOS 123  BILITOT 0.6  PROT 6.6  ALBUMIN 3.2*   No results for input(s): LIPASE, AMYLASE in the last 168 hours. No results for input(s): AMMONIA in the last 168 hours. Coagulation Profile: Recent Labs  Lab 04/08/21 0826 04/08/21 2044 04/09/21 0340  INR 3.2* 2.3* 1.8*   Cardiac Enzymes: No results for input(s): CKTOTAL, CKMB, CKMBINDEX, TROPONINI in the last 168 hours. BNP (last 3 results) Recent Labs  08/25/20 1121 03/27/21 1123  PROBNP 21,395* 3,146*   HbA1C: Recent Labs    04/08/21 0826  HGBA1C 5.4   CBG: Recent Labs  Lab 04/08/21 1240 04/08/21 1526 04/08/21 1734 04/09/21 0649 04/09/21 1102  GLUCAP 136* 84 131* 95 89   Lipid Profile: No results for input(s): CHOL, HDL, LDLCALC, TRIG, CHOLHDL, LDLDIRECT in the last 72 hours. Thyroid Function Tests: No results for input(s): TSH, T4TOTAL, FREET4, T3FREE, THYROIDAB in the last 72 hours. Anemia Panel: No results for input(s): VITAMINB12, FOLATE, FERRITIN, TIBC, IRON, RETICCTPCT in the last 72 hours. Sepsis Labs: Recent Labs  Lab 04/08/21 1816  LATICACIDVEN 1.6    Recent Results (from the past 240 hour(s))  SARS Coronavirus 2 by RT PCR (hospital order, performed in [facility] hospital lab) Nasopharyngeal Nasopharyngeal Swab     Status: None   Collection Time: 04/08/21  8:16 AM   Specimen: Nasopharyngeal Swab  Result Value Ref Range Status   SARS Coronavirus 2 NEGATIVE NEGATIVE Final    Comment: Performed at [facility], [place] [address] [place], [place] [address]  Surgical pcr screen     Status: None   Collection Time: 04/08/21  9:15 AM   Specimen: Nasal Mucosa; Nasal Swab  Result Value Ref Range Status   MRSA, PCR NEGATIVE NEGATIVE Final   Staphylococcus aureus NEGATIVE NEGATIVE Final    Comment: (NOTE) The Xpert SA Assay (FDA approved for NASAL specimens in patients 74 years of age and older), is one component of a  comprehensive surveillance program. It is not intended to diagnose infection nor to guide or monitor treatment. Performed at [facility], [place] [address], [place],  [address]       Radiology Studies: VAS [place] ABI WITH/WO TBI  Result Date: 04/07/2021  LOWER EXTREMITY DOPPLER STUDY Patient Name:  LORRIS WASMUND  Date of Exam:   04/07/2021 Medical Rec #: [id number]         Accession #:    [id number] Date of Birth: 1946/09/16         Patient Gender: M Patient Age:   75Y Exam Location:  [facility] Procedure:      VAS [place] ABI WITH/WO TBI Referring Phys: [id number] [name] HAWK[place] --------------------------------------------------------------------------------  Indications: Ulceration, gangrene right great toe, and peripheral artery              disease. High Risk Factors: Hypertension, Diabetes, no history of smoking. Other Factors: Atrial Fibrillation, CHF, SVT.  Vascular Interventions: Right above knee popliteal to peroneal artery bypass                         graft 03/06/2021. Performing Technologist: Alvia Grove RVT  Examination Guidelines: A complete evaluation includes at minimum, Doppler waveform signals and systolic blood pressure reading at the level of bilateral brachial, anterior tibial, and posterior tibial arteries, when vessel segments are accessible. Bilateral testing is considered an integral part of a complete examination. Photoelectric Plethysmograph (PPG) waveforms and toe systolic pressure readings are included as required and additional duplex testing as needed. Limited examinations for reoccurring indications may be performed as noted.  ABI Findings: +--------+------------------+-----+----------+--------+ Right   Rt Pressure (mmHg)IndexWaveform  Comment  +--------+------------------+-----+----------+--------+ [id number]                                       +--------+------------------+-----+----------+--------+ PTA  97                0.73  biphasic           +--------+------------------+-----+----------+--------+ PERO    >254              1.92 monophasic         +--------+------------------+-----+----------+--------+ DP      164               1.24 monophasic         +--------+------------------+-----+----------+--------+ +---------+------------------+-----+----------+-------+ Left     Lt Pressure (mmHg)IndexWaveform  Comment +---------+------------------+-----+----------+-------+ Brachial 126                                      +---------+------------------+-----+----------+-------+ PTA      >254              1.92 biphasic          +---------+------------------+-----+----------+-------+ DP       >254              1.92 monophasic        +---------+------------------+-----+----------+-------+ Great Toe30                0.23 Abnormal          +---------+------------------+-----+----------+-------+ +-------+-----------+-----------+------------+------------+ ABI/TBIToday's ABIToday's TBIPrevious ABIPrevious TBI +-------+-----------+-----------+------------+------------+ Right  [place]         gangrene   254                      +-------+-----------+-----------+------------+------------+ Left   254        0.23                                +-------+-----------+-----------+------------+------------+   Summary: Right: Resting right ankle-brachial index indicates noncompressible right lower extremity arteries, unable to obtain toe-brachial index due to gangrene great toe. . Left: Resting left ankle-brachial index indicates noncompressible left lower extremity arteries. The left toe-brachial index is abnormal.  *See table(s) above for measurements and observations.  Electronically signed by Monica Martinez MD on 04/07/2021 at 4:22:07 PM.    Final    VAS [place] LOWER EXTREMITY ARTERIAL DUPLEX  Result Date: 04/07/2021 LOWER EXTREMITY ARTERIAL DUPLEX STUDY Patient Name:  KORRY KITTS  Date of Exam:    04/07/2021 Medical Rec #: [id number]         Accession #:    [id number] Date of Birth: [birth year]         Patient Gender: M Patient Age:   75Y Exam Location:  [facility] Procedure:      VAS [place] LOWER EXTREMITY ARTERIAL DUPLEX Referring Phys: [id number] [place] --------------------------------------------------------------------------------  Indications: Ulceration, and peripheral artery disease. High Risk Factors: Hypertension, Diabetes.  Vascular Interventions: Right above knee to below knee bypass graft 03/06/2021. Current ABI:            Right ABI [place], Left [place] Performing Technologist: Alvia Grove RVT  Examination Guidelines: A complete evaluation includes B-mode imaging, spectral Doppler, color Doppler, and power Doppler as needed of all accessible portions of each vessel. Bilateral testing is considered an integral part of a complete examination. Limited examinations for reoccurring indications may be performed as noted.  +----------+--------+-----+--------+---------+-------------------------+ RIGHT     PSV cm/sRatioStenosisWaveform Comments                  +----------+--------+-----+--------+---------+-------------------------+  CFA Prox  72                   triphasic                          +----------+--------+-----+--------+---------+-------------------------+ CFA Distal46                   triphasic                          +----------+--------+-----+--------+---------+-------------------------+ DFA       33                   biphasic                           +----------+--------+-----+--------+---------+-------------------------+ SFA Prox  59                   biphasic                           +----------+--------+-----+--------+---------+-------------------------+ SFA Mid   77                   triphasicdampened                  +----------+--------+-----+--------+---------+-------------------------+ SFA Distal104                   triphasicdampened , proximal graft +----------+--------+-----+--------+---------+-------------------------+  Right Graft #1: +------------------+--------+--------+----------+--------+                   PSV cm/sStenosisWaveform  Comments +------------------+--------+--------+----------+--------+ Inflow            112             monophasic         +------------------+--------+--------+----------+--------+ Prox Anastomosis  60              monophasic         +------------------+--------+--------+----------+--------+ Proximal Graft    148             monophasicbrisk    +------------------+--------+--------+----------+--------+ Mid Graft         88              monophasicbrisk    +------------------+--------+--------+----------+--------+ Distal Graft      51              monophasic         +------------------+--------+--------+----------+--------+ Distal Anastomosis73              monophasic         +------------------+--------+--------+----------+--------+ Outflow           73              monophasic         +------------------+--------+--------+----------+--------+   Summary: Right: Patent above knee to below knee bypass graft with no visualized stenosis.  See table(s) above for measurements and observations. Electronically signed by Monica Martinez MD on 04/07/2021 at 3:40:41 PM.    Final     Scheduled Meds:  [MAR Hold] amiodarone  200 mg Oral Daily   [MAR Hold] atorvastatin  40 mg Oral q1800   [MAR Hold] Chlorhexidine Gluconate Cloth  6 each Topical Once   [MAR Hold] docusate sodium  100 mg Oral BID   [MAR Hold] gabapentin  300 mg Oral QHS   [MAR  Hold] insulin aspart  0-15 Units Subcutaneous TID WC   [MAR Hold] pantoprazole  40 mg Oral Daily   Continuous Infusions:  sodium chloride 75 mL/hr at 04/09/21 0723   [MAR Hold] cefTRIAXone (ROCEPHIN)  IV Stopped (04/08/21 2122)   And   [MAR Hold] metronidazole 500 mg (04/09/21 1215)   heparin Stopped  (04/09/21 1213)   lactated ringers 10 mL/hr at 04/09/21 1336     LOS: 1 day   Time spent: 34 minutes   Darliss Cheney, MD [facility]  04/09/2021, 2:03 PM   How to contact the [facility] Attending or Consulting provider [place] or covering provider during after hours [place], for this patient?  Check the care team in [facility] and look for a) attending/consulting [facility] provider listed and b) the [facility] team listed. Page or secure chat [phone]. Log into [email or web address] and use [place]'s universal password to access. If you do not have the password, please contact the hospital operator. Locate the [facility] provider you are looking for under [facility] and page to a number that you can be directly reached. If you still have difficulty reaching the provider, please page the [facility] (Director on Call) for the Hospitalists listed on amion for assistance.

## 2021-04-09 NOTE — Anesthesia Procedure Notes (Signed)
Procedure Name: LMA Insertion Date/Time: 04/09/2021 1:47 PM Performed by: Reece Agar, CRNA Pre-anesthesia Checklist: Patient identified, Emergency Drugs available, Suction available and Patient being monitored Patient Re-evaluated:Patient Re-evaluated prior to induction Oxygen Delivery Method: Circle System Utilized Preoxygenation: Pre-oxygenation with 100% oxygen Induction Type: IV induction Ventilation: Mask ventilation without difficulty LMA: LMA inserted LMA Size: 4.0 Number of attempts: 1 Airway Equipment and Method: Bite block Placement Confirmation: positive ETCO2 and breath sounds checked- equal and bilateral Tube secured with: Tape Dental Injury: Teeth and Oropharynx as per pre-operative assessment

## 2021-04-09 NOTE — Progress Notes (Signed)
Dr. [name] called back and stated to stop the heparin drip; Katharine Look floor RN notified and verbalized understanding.

## 2021-04-09 NOTE — Progress Notes (Signed)
[facility] [place] PROGRESS NOTE  ASSESSMENT / PLAN: Ryan [name] is a 75 y.o. male with gangrenous right heel (? Decubitus ulcer) after right popliteal - peroneal bypass 03/06/21 with GSV. Also with left great toe ulceration.  Plan OR for R heel debridement, 1/2 toe amputation. If calcaneus is involved in infection, will need R BKA. He is understanding. To OR today.   SUBJECTIVE: No complaints.  OBJECTIVE: BP 134/61 (BP Location: Right Arm)   Pulse 67   Temp 98.2 F (36.8 C) (Oral)   Resp 17   Ht 6' (1.829 m)   Wt 73.1 kg   SpO2 95%   BMI 21.86 kg/m   Intake/Output Summary (Last 24 hours) at 04/09/2021 0932 Last data filed at 04/09/2021 0800 Gross per 24 hour  Intake 1087.3 ml  Output 625 ml  Net 462.3 ml    No distress Good doppler flow in R ankle Unchanged appearance of RLE heel and toes  CBC Latest Ref Rng & Units 04/09/2021 04/08/2021 03/27/2021  WBC 4.0 - 10.5 K/uL 6.3 6.4 7.1  Hemoglobin 13.0 - 17.0 g/dL 9.0(L) 10.1(L) 9.6(L)  Hematocrit 39.0 - 52.0 % 30.0(L) 35.3(L) 30.4(L)  Platelets 150 - 400 K/uL 230 269 362     CMP Latest Ref Rng & Units 04/09/2021 04/08/2021 04/03/2021  Glucose 70 - 99 mg/dL 97 97 96  BUN 8 - 23 mg/dL '19 19 20  '$ Creatinine 0.61 - 1.24 mg/dL 1.46(H) 1.60(H) 1.47(H)  Sodium 135 - 145 mmol/L 139 140 142  Potassium 3.5 - 5.1 mmol/L 4.2 4.1 3.7  Chloride 98 - 111 mmol/L 110 105 108  CO2 22 - 32 mmol/L '22 26 26  '$ Calcium 8.9 - 10.3 mg/dL 8.7(L) 9.2 9.1  Total Protein 6.5 - 8.1 g/dL - 6.6 -  Total Bilirubin 0.3 - 1.2 mg/dL - 0.6 -  Alkaline Phos 38 - 126 U/L - 123 -  AST 15 - 41 U/L - 22 -  ALT 0 - 44 U/L - 18 -    Estimated Creatinine Clearance: 43.8 mL/min (A) (by C-G formula based on SCr of 1.46 mg/dL (H)).  [name]. [name], MD [facility] Phone Number: [phone] 04/09/2021 9:32 AM

## 2021-04-09 NOTE — Progress Notes (Deleted)
Charge R.N. called for new bed. Patient does not like AIr mattress bed due to motor is loud for him.

## 2021-04-09 NOTE — Op Note (Signed)
DATE OF SERVICE: 04/09/2021  PATIENT:  Ryan [name]  75 y.o. male  PRE-OPERATIVE DIAGNOSIS: right foot ischemic ulceration and gangrene (first toe, second toe, heel ulcer); status post right popliteal - peroneal bypass  POST-OPERATIVE DIAGNOSIS:  Same  PROCEDURE:   1) right first toe partial toe amputation 2) right second toe amputation 3) right heel sharp excisional debridement (4 x 3 x 1cm) 4) application of Myriad skin substitute (4 x 3 x 1 cm) 5) application of negative pressure therapy (wound vac)  SURGEON:  Surgeon(s) and Role:    * Cherre Robins, MD - Primary  ASSISTANT: none  ANESTHESIA:   general  EBL: 162mL  BLOOD ADMINISTERED:none  DRAINS: none   LOCAL MEDICATIONS USED:  NONE  SPECIMEN:  none  COUNTS: confirmed correct.  TOURNIQUET:  none   PATIENT DISPOSITION:  PACU - hemodynamically stable.   Delay start of Pharmacological VTE agent (>24hrs) due to surgical blood loss or risk of bleeding: no  INDICATION FOR PROCEDURE: Ryan [name] is a 75 y.o. male with ischemic ulceration and gangrene about his foot after recent revascularization. His right heel was quite worrisome and malodorous. After careful discussion of risks, benefits, and alternatives the patient was offered debridement, toe amputation, and possible below knee amputation. The patient understood and wished to proceed.  OPERATIVE FINDINGS: Heel debridement revealed superficial unstageable decubitus ulcer involving the dermis and subcutaneous fat only.  About a centimeter of tissue remained between the healthy margin of debridement and calcaneus.  I elected to proceed with foot salvage.  Good healthy bleeding tissue was encountered at all incisions.  Partial first toe amputation uneventful.  Second toe amputation uneventful.  Applied skin substitute (myriad) to heal wound to encourage granulation.  DESCRIPTION OF PROCEDURE: After identification of the patient in the pre-operative holding area,  the patient was transferred to the operating room. The patient was positioned supine on the operating room table. Anesthesia was induced. The right foot and ankle was prepped and draped in standard fashion. A surgical pause was performed confirming correct patient, procedure, and operative location.  Sharp excisional debridement of the heel was performed using a 10 blade and curved Mayo scissors.  Fortunately the decubitus ulceration is healed as only superficial involving the dermis and a few millimeters of subcutaneous fat.  I elected to proceed with foot salvage.  A partial great toe amputation was performed.  A fishmouth incision was made around the toe just proximal to the nail.  Incision was carried down to the bone.  The bone was debrided using a bone cutter and rongeur.  Healthy flap was created and closed using interrupted 3-0 nylon suture.  A second great toe amputation was then performed.  A standard "tennis racquet" incision was made around the second toe and carried down to the bone.  Toe was excised with a bone cutter.  The bone was debrided with a rongeur to allow healthy flap closure.  The wound was closed with multiple interrupted 3-0 nylon sutures.  Myriad skin replacement was brought onto the field.  A 3 x 4 x 1 cm piece was cut to apply to the heel wound.  This was sutured in place and will to positions using 2-0 Monocryl.  Xeroform was applied over the tissue matrix.  Sterile K-Y jelly was applied over the Xeroform.  A small wound VAC sponge was applied over this dressing and secured to the wound using Ioban.  A bulky dressing was applied to the forefoot. 4 and  6 inch Ace wrap were applied to the foot.  Upon completion of the case instrument and sharps counts were confirmed correct. The patient was transferred to the PACU in good condition. I was present for all portions of the procedure.  [name]. [name], MD [facility] Phone Number: [phone] 04/09/2021 3:00 PM

## 2021-04-09 NOTE — Progress Notes (Signed)
Initial Nutrition Assessment  DOCUMENTATION CODES:   Not applicable  INTERVENTION:   -Once diet is advanced, add:   -MVI with minerals daily -Ensure Max po daily, each supplement provides 150 kcal and 30 grams of protein -1 packet Juven BID, each packet provides 95 calories, 2.5 grams of protein (collagen), and 9.8 grams of carbohydrate (3 grams sugar); also contains 7 grams of L-arginine and L-glutamine, 300 mg vitamin C, 15 mg vitamin E, 1.2 mcg vitamin B-12, 9.5 mg zinc, 200 mg calcium, and 1.5 g  Calcium Beta-hydroxy-Beta-methylbutyrate to support wound healing   NUTRITION DIAGNOSIS:   Increased nutrient needs related to post-op healing as evidenced by estimated needs.  GOAL:   Patient will meet greater than or equal to 90% of their needs  MONITOR:   PO intake, Supplement acceptance, Diet advancement, Labs, Weight trends, Skin, I & O's  REASON FOR ASSESSMENT:   Consult Assessment of nutrition requirement/status, Wound healing  ASSESSMENT:   Ryan [name] is a 75 y.o. male with medical history significant of afib; MVR; chronic systolic CHF with AICD in place; DM; HTN; and PVD presenting for foot debridement/possible BKA.    He has had difficulty with a heel ulcer, not healing up well.  He was coming in today for surgery to have it cleaned up, possible amputation.  Unable to do the surgery because his INR was too high.  He takes Coumadin for afib.  Some days he feels ok and some he doesn't.  He has not feeling like he had a fever.  Pt admitted rt foot wounds with critical limb ischemia.   Reviewed I/O's: +88 ml x 24 hours  UOP: 450 ml x 24 hours  Per vascular surgery notes, pt NPO for procedure today. Plan for rt heel debridement with possible BKA.   Spoke with pt at bedside, who was pleasant and in good spirits today. He shares that he had been living at [facility] for 20 days PTA and was supposed to be discharged home today, however, was hospitalized due to worsening foot  wounds. Per pt report, he developed these sometime during SNF stay. Nursing was performing dressing changes.   Pt reports fair appetite PTA. He was consuming 3 meals per day; at SNF he typically consumed a good breakfast and lunch, but intake was variable at dinner secondary to food quality. He was consuming Prosource supplements, which he did not like, but is willing to take them if needed. Prior to SNF stay, pt with good intake, also consuming 3 meals per day (Breakfast: eggs and breakfast meat; Lunch: meal from meals on wheels; Dinner: meat, starch, and vegetable).   Per pt, UBW is around 170#. Pt estimates she has lost about 10# over the past month, which he attributes to decreased oral intake as well as diuresis. Reviewed wt hx; wt has been stable over the past 2 months.   Discussed importance of good meal and supplement intake, as well as good glycemic control to promote post-operative healing. Reviewed supplements on formulary; pt amenable to Ensure Max (was drinking Premier Protein at home) as well as Juven). Assisted pt in obtaining additional towels and a clean gown. Pt very appreciative of visit.   Medications reviewed and include colace and 0.9% sodium chloride infusion @ 75 ml/hr.  Lab Results  Component Value Date   HGBA1C 5.4 04/08/2021   PTA DM medications are 10 mg empagliflozin daily.   Labs reviewed: CBGS: 95 (inpatient orders for glycemic control are 0-15 unitas insulin aspart TID  with meals).    NUTRITION - FOCUSED PHYSICAL EXAM:  Flowsheet Row Most Recent Value  Orbital Region No depletion  Upper Arm Region No depletion  Thoracic and Lumbar Region No depletion  Buccal Region No depletion  Temple Region No depletion  Clavicle Bone Region No depletion  Clavicle and Acromion Bone Region No depletion  Scapular Bone Region No depletion  Dorsal Hand No depletion  Patellar Region Mild depletion  Anterior Thigh Region Mild depletion  Posterior Calf Region Mild depletion   Edema (RD Assessment) None  Hair Reviewed  Eyes Reviewed  Mouth Reviewed  Skin Reviewed  Nails Reviewed       Diet Order:   Diet Order             Diet NPO time specified  Diet effective midnight                   EDUCATION NEEDS:   Education needs have been addressed  Skin:  Skin Assessment: Skin Integrity Issues: Skin Integrity Issues:: Other (Comment) Other: rt foot wound with critical limb ischemia  Last BM:  04/07/21  Height:   Ht Readings from Last 1 Encounters:  04/08/21 6' (1.829 m)    Weight:   Wt Readings from Last 1 Encounters:  04/08/21 73.1 kg    Ideal Body Weight:  80.9 kg  BMI:  Body mass index is 21.86 kg/m.  Estimated Nutritional Needs:   Kcal:  2200-2400  Protein:  105-120 grams  Fluid:  > 2 L    Loistine Chance, RD, LDN, [place] Registered Dietitian II Certified Diabetes Care and Education Specialist Please refer to [facility] for RD and/or RD on-call/weekend/after hours pager

## 2021-04-09 NOTE — Progress Notes (Signed)
ANTICOAGULATION CONSULT NOTE - Initial Consult  Pharmacy Consult for heparin Indication: atrial fibrillation  Allergies  Allergen Reactions   Tuna [Fish Allergy] Nausea And Vomiting    Patient Measurements: Height: 6' (182.9 cm) Weight: 73.1 kg (161 lb 2.5 oz) IBW/kg (Calculated) : 77.6 Heparin Dosing Weight: 73.1 kg  Vital Signs: Temp: 97.8 F (36.6 C) (07/21 1714) Temp Source: Oral (07/21 1714) BP: 128/73 (07/21 1714) Pulse Rate: 74 (07/21 1647)  Labs: Recent Labs    04/08/21 0826 04/08/21 2044 04/09/21 0139 04/09/21 0340  HGB 10.1*  --  9.0*  --   HCT 35.3*  --  30.0*  --   PLT 269  --  230  --   APTT 47*  --   --   --   LABPROT 33.1* 25.5*  --  21.3*  INR 3.2* 2.3*  --  1.8*  CREATININE 1.60*  --  1.46*  --      Estimated Creatinine Clearance: 43.8 mL/min (A) (by C-G formula based on SCr of 1.46 mg/dL (H)).   Medical History: Past Medical History:  Diagnosis Date   AICD (automatic cardioverter/defibrillator) present    Arthritis    Atrial fibrillation (HCC)    Cardiomyopathy, dilated (HCC)    CHF (congestive heart failure) ([place])    Diabetes mellitus without complication ([place])    Endocarditis    Hypertension    Peripheral vascular disease (HCC)    PVC's (premature ventricular contractions)    SVT (supraventricular tachycardia)  long RP     Assessment: 75yo male to start heparin bridge for Afib while INR subtherapeutic while awaiting surgery. Patient was on PTA warfarin requiring reversal with FFP on 7/20 before foot debridement and toe amputation on 7/21. Pharmacy consulted to restart heparin infusion 4 hours post surgery per VVS.  Hb was low on admit, now down to 9 (BL ~12), platelets normal and no signs of bleeding per nurse.  Goal of Therapy:  Heparin level 0.3-0.7 units/ml Monitor platelets by anticoagulation protocol: Yes   Plan:  Start heparin 1200 units/hr (based on recent heparin requirements)  8 hour heparin level Monitor heparin  levels, CBC, and signs/symptoms of bleeding  Laurey Arrow, PharmD PGY1 Pharmacy Resident 04/09/2021  5:29 PM  Please check [email or web address] for unit-specific pharmacy phone numbers.

## 2021-04-09 NOTE — Progress Notes (Signed)
Called and paged Dr. [name] regarding if heparin drip needs to be stopped prior to OR. Awaiting return call.

## 2021-04-10 ENCOUNTER — Encounter (HOSPITAL_COMMUNITY): Payer: Self-pay | Admitting: Vascular Surgery

## 2021-04-10 DIAGNOSIS — I70229 Atherosclerosis of native arteries of extremities with rest pain, unspecified extremity: Secondary | ICD-10-CM | POA: Diagnosis not present

## 2021-04-10 LAB — GLUCOSE, CAPILLARY
Glucose-Capillary: 118 mg/dL — ABNORMAL HIGH (ref 70–99)
Glucose-Capillary: 160 mg/dL — ABNORMAL HIGH (ref 70–99)
Glucose-Capillary: 118 mg/dL — ABNORMAL HIGH (ref 70–99)
Glucose-Capillary: 120 mg/dL — ABNORMAL HIGH (ref 70–99)

## 2021-04-10 LAB — CBC
HCT: 30.6 % — ABNORMAL LOW (ref 39.0–52.0)
Hemoglobin: 9.2 g/dL — ABNORMAL LOW (ref 13.0–17.0)
MCH: 24.1 pg — ABNORMAL LOW (ref 26.0–34.0)
MCHC: 30.1 g/dL (ref 30.0–36.0)
MCV: 80.3 fL (ref 80.0–100.0)
Platelets: 223 10*3/uL (ref 150–400)
RBC: 3.81 MIL/uL — ABNORMAL LOW (ref 4.22–5.81)
RDW: 17.7 % — ABNORMAL HIGH (ref 11.5–15.5)
WBC: 7.2 10*3/uL (ref 4.0–10.5)
nRBC: 0 % (ref 0.0–0.2)

## 2021-04-10 LAB — HEPARIN LEVEL (UNFRACTIONATED)
Heparin Unfractionated: 0.4 IU/mL (ref 0.30–0.70)
Heparin Unfractionated: 0.5 IU/mL (ref 0.30–0.70)

## 2021-04-10 LAB — BASIC METABOLIC PANEL
Anion gap: 8 (ref 5–15)
BUN: 19 mg/dL (ref 8–23)
CO2: 21 mmol/L — ABNORMAL LOW (ref 22–32)
Calcium: 8.5 mg/dL — ABNORMAL LOW (ref 8.9–10.3)
Chloride: 109 mmol/L (ref 98–111)
Creatinine, Ser: 1.49 mg/dL — ABNORMAL HIGH (ref 0.61–1.24)
GFR, Estimated: 48 mL/min — ABNORMAL LOW (ref >60)
Glucose, Bld: 114 mg/dL — ABNORMAL HIGH (ref 70–99)
Potassium: 4.3 mmol/L (ref 3.5–5.1)
Sodium: 138 mmol/L (ref 135–145)

## 2021-04-10 NOTE — Progress Notes (Signed)
PROGRESS NOTE    Ryan [name]  [id number] DOB: 09-Oct-1945 DOA: 04/08/2021 PCP: [name], MD   Brief Narrative:  HPI: Ryan [name] is a 75 y.o. male with medical history significant of afib; MVR; chronic systolic CHF with AICD in place; DM; HTN; and PVD presenting for foot debridement/possible BKA.    He has had difficulty with a heel ulcer, not healing up well.  He was coming in today for surgery to have it cleaned up, possible amputation.  Unable to do the surgery because his INR was too high.  He takes Coumadin for afib.  Some days he feels ok and some he doesn't.  He has not feeling like he had a fever.   ED Course: Dr. Donzetta Matters recommends admission, IV antibiotics, INR 3.2 so unable to do surgery today; giving FFP and plan to reschedule surgery for tomorrow.   Assessment & Plan:   Principal Problem:   Critical lower limb ischemia (HCC) Active Problems:   Chronic systolic heart failure (HCC)   Status post mitral valve replacement with bioprosthetic valve   Diabetes mellitus ([place])   Essential hypertension   ICD (implantable cardioverter-defibrillator) in place   Stage 3 chronic kidney disease ([place])  R foot wounds with critical limb ischemia: Patient is s/p pop-peroneal bypass on 6/22. Ongoing R foot wound infection. He was seen day before admission with concern for infection, started on Clinda at his facility. He was scheduled for debridement and possible BKA on 04/08/2021 however due to elevated INR, he was instead admitted under hospitalist service.  Eventually underwent right first toe partial amputation, right second toe amputation, right heel Sharp excisional debridement and wound VAC was placed on 04/09/2021.  Patient remains on Rocephin and Flagyl.  Management per vascular surgery.  Per vascular surgery note, they will work on his left lower extremity next week.   Chronic systolic CHF with AICD: Euvolemic.  Hold Lasix. -03/04/21 echo with EF 40-45%    Afib/MVR -Bioprosthetic valve -On Coumadin, did not apparently come off pre-procedure Currently on heparin bridging.   DM type II: Hemoglobin A1c only 5.4.  Hold Jardiance and continue SSI.  Controlled.  Essential HTN: In review of last hospitalization, he was stopped from BP medications and started on Midodrine - which may need to be added back since blood pressure is labile.   HLD -Continue Lipitor   Stage 3b CKD: Better than his baseline.  DVT prophylaxis:   Heparin drip.   Code Status: Full Code  Family Communication:  None present at bedside.  Plan of care discussed with patient in length and he verbalized understanding and agreed with it.  Status is: Inpatient  Remains inpatient appropriate because:Ongoing diagnostic testing needed not appropriate for outpatient work up  Dispo: The patient is from: Home              Anticipated d/c is to: Home              Patient currently is not medically stable to d/c.   Difficult to place patient No        Estimated body mass index is 22.39 kg/m as calculated from the following:   Height as of this encounter: 6' (1.829 m).   Weight as of this encounter: 74.9 kg.      Nutritional status:  Nutrition Problem: Increased nutrient needs Etiology: post-op healing   Signs/Symptoms: estimated needs   Interventions: MVI, Juven, Magic cup, Premier Protein    Consultants:  Vascular surgery  Procedures:  As above Antimicrobials:  Anti-infectives (From admission, onward)    Start     Dose/Rate Route Frequency Ordered Stop   04/08/21 2000  cefTRIAXone (ROCEPHIN) 2 g in sodium chloride 0.9 % 100 mL IVPB       See Hyperspace for full Linked Orders Report.   2 g 200 mL/hr over 30 Minutes Intravenous Every 24 hours 04/08/21 1212 04/15/21 1959   04/08/21 2000  metroNIDAZOLE (FLAGYL) IVPB 500 mg       See Hyperspace for full Linked Orders Report.   500 mg 100 mL/hr over 60 Minutes Intravenous Every 8 hours 04/08/21 1212  04/15/21 1959   04/08/21 0823  ceFAZolin (ANCEF) IVPB 2g/100 mL premix  Status:  Discontinued        2 g 200 mL/hr over 30 Minutes Intravenous 30 min pre-op 04/08/21 [id number] 04/08/21 1212          Subjective: See and examined.  No complaints.  Objective: Vitals:   04/09/21 2335 04/10/21 0427 04/10/21 0435 04/10/21 1114  BP: 107/60 (!) 141/76  117/60  Pulse: 77 70  75  Resp: '17 16  17  '$ Temp: 98.3 F (36.8 C) 98.2 F (36.8 C)  98.1 F (36.7 C)  TempSrc: Oral Oral  Oral  SpO2: 98% 99%  100%  Weight:   74.9 kg   Height:        Intake/Output Summary (Last 24 hours) at 04/10/2021 1311 Last data filed at 04/10/2021 [id number] Gross per 24 hour  Intake 1467 ml  Output 925 ml  Net 542 ml    Filed Weights   04/08/21 0825 04/10/21 0435  Weight: 73.1 kg 74.9 kg    Examination:  General exam: Appears calm and comfortable  Respiratory system: Clear to auscultation. Respiratory effort normal. Cardiovascular system: S1 & S2 heard, RRR. No JVD, murmurs, rubs, gallops or clicks. No pedal edema. Gastrointestinal system: Abdomen is nondistended, soft and nontender. No organomegaly or masses felt. Normal bowel sounds heard. Central nervous system: Alert and oriented. No focal neurological deficits. Extremities: Symmetric 5 x 5 power. Skin: Dressing in right extremities with wound VAC attached.  Dressing on left foot as well. Psychiatry: Judgement and insight appear normal. Mood & affect appropriate.   Data Reviewed: I have personally reviewed following labs and imaging studies  CBC: Recent Labs  Lab 04/08/21 0826 04/09/21 0139 04/10/21 0356  WBC 6.4 6.3 7.2  HGB 10.1* 9.0* 9.2*  HCT 35.3* 30.0* 30.6*  MCV 81.9 79.6* 80.3  PLT 269 230 [id number]    Basic Metabolic Panel: Recent Labs  Lab 04/08/21 0826 04/09/21 0139 04/10/21 0356  NA 140 139 138  K 4.1 4.2 4.3  CL 105 110 109  CO2 26 22 21*  GLUCOSE 97 97 114*  BUN '19 19 19  '$ CREATININE 1.60* 1.46* 1.49*  CALCIUM 9.2 8.7* 8.5*     GFR: Estimated Creatinine Clearance: 44 mL/min (A) (by C-G formula based on SCr of 1.49 mg/dL (H)). Liver Function Tests: Recent Labs  Lab 04/08/21 0826  AST 22  ALT 18  ALKPHOS 123  BILITOT 0.6  PROT 6.6  ALBUMIN 3.2*    No results for input(s): LIPASE, AMYLASE in the last 168 hours. No results for input(s): AMMONIA in the last 168 hours. Coagulation Profile: Recent Labs  Lab 04/08/21 0826 04/08/21 2044 04/09/21 0340  INR 3.2* 2.3* 1.8*    Cardiac Enzymes: No results for input(s): CKTOTAL, CKMB, CKMBINDEX, TROPONINI in the last 168 hours. BNP (last 3 results) Recent Labs  08/25/20 1121 03/27/21 1123  PROBNP 21,395* 3,146*    HbA1C: Recent Labs    04/08/21 0826  HGBA1C 5.4    CBG: Recent Labs  Lab 04/09/21 1632 04/09/21 1713 04/09/21 2119 04/10/21 0628 04/10/21 1116  GLUCAP 91 85 185* 118* 160*    Lipid Profile: No results for input(s): CHOL, HDL, LDLCALC, TRIG, CHOLHDL, LDLDIRECT in the last 72 hours. Thyroid Function Tests: No results for input(s): TSH, T4TOTAL, FREET4, T3FREE, THYROIDAB in the last 72 hours. Anemia Panel: No results for input(s): VITAMINB12, FOLATE, FERRITIN, TIBC, IRON, RETICCTPCT in the last 72 hours. Sepsis Labs: Recent Labs  Lab 04/08/21 1816  LATICACIDVEN 1.6     Recent Results (from the past 240 hour(s))  SARS Coronavirus 2 by RT PCR (hospital order, performed in [facility] hospital lab) Nasopharyngeal Nasopharyngeal Swab     Status: None   Collection Time: 04/08/21  8:16 AM   Specimen: Nasopharyngeal Swab  Result Value Ref Range Status   SARS Coronavirus 2 NEGATIVE NEGATIVE Final    Comment: Performed at [facility], [place] [address], [place], [place] [address]  Surgical pcr screen     Status: None   Collection Time: 04/08/21  9:15 AM   Specimen: Nasal Mucosa; Nasal Swab  Result Value Ref Range Status   MRSA, PCR NEGATIVE NEGATIVE Final   Staphylococcus aureus NEGATIVE NEGATIVE Final    Comment:  (NOTE) The Xpert SA Assay (FDA approved for NASAL specimens in patients 8 years of age and older), is one component of a comprehensive surveillance program. It is not intended to diagnose infection nor to guide or monitor treatment. Performed at [facility], [place] [address] [place], [place] [address]   Blood Cultures x 2 sites     Status: None (Preliminary result)   Collection Time: 04/08/21  6:16 PM   Specimen: BLOOD LEFT WRIST  Result Value Ref Range Status   Specimen Description BLOOD LEFT WRIST  Final   Special Requests   Final    BOTTLES DRAWN AEROBIC ONLY Blood Culture adequate volume   Culture   Final    NO GROWTH 2 DAYS Performed at [facility], [place] [address] [place], [place] [address]    Report Status PENDING  Incomplete  Blood Cultures x 2 sites     Status: None (Preliminary result)   Collection Time: 04/08/21  6:24 PM   Specimen: BLOOD RIGHT WRIST  Result Value Ref Range Status   Specimen Description BLOOD RIGHT WRIST  Final   Special Requests   Final    BOTTLES DRAWN AEROBIC ONLY Blood Culture adequate volume   Culture   Final    NO GROWTH 2 DAYS Performed at [facility], [address] [place], [place] [address]    Report Status PENDING  Incomplete       Radiology Studies: No results found.  Scheduled Meds:  amiodarone  200 mg Oral Daily   atorvastatin  40 mg Oral q1800   Chlorhexidine Gluconate Cloth  6 each Topical Once   docusate sodium  100 mg Oral BID   gabapentin  300 mg Oral QHS   insulin aspart  0-15 Units Subcutaneous TID WC   multivitamin with minerals  1 tablet Oral Daily   nutrition supplement (JUVEN)  1 packet Oral BID BM   pantoprazole  40 mg Oral Daily   Ensure Max Protein  11 oz Oral QHS   Continuous Infusions:  sodium chloride 75 mL/hr at 04/10/21  1011   cefTRIAXone (ROCEPHIN)  IV 2 g (04/09/21 1948)   And   metronidazole 500 mg (04/10/21 1224)   heparin 1,200 Units/hr (04/09/21 2015)     LOS: 2 days    Time spent: 30 minutes   Darliss Cheney, MD [facility]  04/10/2021, 1:11 PM   How to contact the [facility] Attending or Consulting provider [place] or covering provider during after hours [place], for this patient?  Check the care team in [facility] and look for a) attending/consulting TR[facility] provider listed and b) the [facility] team listed. Page or secure chat [phone]. Log into [email or web address] and use [place]'s universal password to access. If you do not have the password, please contact the hospital operator. Locate the [facility] provider you are looking for under [facility] and page to a number that you can be directly reached. If you still have difficulty reaching the provider, please page the [facility] (Director on Call) for the Hospitalists listed on amion for assistance.

## 2021-04-10 NOTE — Progress Notes (Signed)
ANTICOAGULATION CONSULT NOT  Pharmacy Consult for heparin Indication: atrial fibrillation  Allergies  Allergen Reactions   Tuna [Fish Allergy] Nausea And Vomiting    Patient Measurements: Height: 6' (182.9 cm) Weight: 74.9 kg (165 lb 2 oz) IBW/kg (Calculated) : 77.6 Heparin Dosing Weight: 73.1 kg  Vital Signs: Temp: 98.2 F (36.8 C) (07/22 0427) Temp Source: Oral (07/22 0427) BP: 141/76 (07/22 0427) Pulse Rate: 70 (07/22 0427)  Labs: Recent Labs    04/08/21 0826 04/08/21 2044 04/09/21 0139 04/09/21 0340 04/09/21 1755 04/10/21 0356  HGB 10.1*  --  9.0*  --   --  9.2*  HCT 35.3*  --  30.0*  --   --  30.6*  PLT 269  --  230  --   --  223  APTT 47*  --   --   --   --   --   LABPROT 33.1* 25.5*  --  21.3*  --   --   INR 3.2* 2.3*  --  1.8*  --   --   HEPARINUNFRC  --   --   --   --  <0.10* 0.40  CREATININE 1.60*  --  1.46*  --   --  1.49*     Estimated Creatinine Clearance: 44 mL/min (A) (by C-G formula based on SCr of 1.49 mg/dL (H)).   Medical History: Past Medical History:  Diagnosis Date   AICD (automatic cardioverter/defibrillator) present    Arthritis    Atrial fibrillation (HCC)    Cardiomyopathy, dilated (HCC)    CHF (congestive heart failure) ([place])    Diabetes mellitus without complication ([place])    Endocarditis    Hypertension    Peripheral vascular disease (HCC)    PVC's (premature ventricular contractions)    SVT (supraventricular tachycardia)  long RP     Assessment: 75yo male to start heparin bridge for Afib while INR subtherapeutic while awaiting surgery. Patient was on PTA warfarin requiring reversal with FFP on 7/20 before foot debridement and toe amputation on 7/21. Pharmacy consulted to restart heparin infusion 4 hours post surgery per VVS.  Heparin level therapeutic (0.4) on gtt at 1200 units/hr. No bleeding noted. Hgb stable.  Goal of Therapy:  Heparin level 0.3-0.7 units/ml Monitor platelets by anticoagulation protocol: Yes    Plan:  Continue heparin 1200 units/hr F/u 6 hr confirmatory heparin level  Sherlon Handing, PharmD, BCPS Please see amion for complete clinical pharmacist phone list 04/10/2021  4:35 AM

## 2021-04-10 NOTE — Progress Notes (Addendum)
[facility] [place]  Subjective  - Comfortable with good pain control   Objective (!) 141/76 70 98.2 F (36.8 C) (Oral) 16 99%  Intake/Output Summary (Last 24 hours) at 04/10/2021 0703 Last data filed at 04/10/2021 4827 Gross per 24 hour  Intake 2016.3 ml  Output 1100 ml  Net 916.3 ml    Right foot dressing clean and dry Vac to suction Lungs non labored breathing   Assessment/Planning: POD # 1 1) right first toe partial toe amputation 2) right second toe amputation 3) right heel sharp excisional debridement Wound vac placed to heel wound  Plan to change dressing tomorrow. Dr. [name] to instruct when to change wound vac with application of Myriad skin substitute.  Will plan on VVS changing the vac early next week.  Leave all dressings in place.  Non weight bearing right LE. Plan for angiogram next week and possible left LE intervention for left GT ischemic changes HGB stable at 9.2  [facility] 04/10/2021 7:03 AM --  Laboratory Lab Results: Recent Labs    04/09/21 0139 04/10/21 0356  WBC 6.3 7.2  HGB 9.0* 9.2*  HCT 30.0* 30.6*  PLT 230 223   BMET Recent Labs    04/09/21 0139 04/10/21 0356  NA 139 138  K 4.2 4.3  CL 110 109  CO2 22 21*  GLUCOSE 97 114*  BUN 19 19  CREATININE 1.46* 1.49*  CALCIUM 8.7* 8.5*    COAG Lab Results  Component Value Date   INR 1.8 (H) 04/09/2021   INR 2.3 (H) 04/08/2021   INR 3.2 (H) 04/08/2021   No results found for: PTT

## 2021-04-10 NOTE — Progress Notes (Signed)
ANTICOAGULATION CONSULT NOT  Pharmacy Consult for heparin Indication: atrial fibrillation  Allergies  Allergen Reactions   Tuna [Fish Allergy] Nausea And Vomiting    Patient Measurements: Height: 6' (182.9 cm) Weight: 74.9 kg (165 lb 2 oz) IBW/kg (Calculated) : 77.6 Heparin Dosing Weight: 73.1 kg  Vital Signs: Temp: 98.1 F (36.7 C) (07/22 1114) Temp Source: Oral (07/22 1114) BP: 117/60 (07/22 1114) Pulse Rate: 75 (07/22 1114)  Labs: Recent Labs    04/08/21 0826 04/08/21 2044 04/09/21 0139 04/09/21 0340 04/09/21 1755 04/10/21 0356 04/10/21 1131  HGB 10.1*  --  9.0*  --   --  9.2*  --   HCT 35.3*  --  30.0*  --   --  30.6*  --   PLT 269  --  230  --   --  223  --   APTT 47*  --   --   --   --   --   --   LABPROT 33.1* 25.5*  --  21.3*  --   --   --   INR 3.2* 2.3*  --  1.8*  --   --   --   HEPARINUNFRC  --   --   --   --  <0.10* 0.40 0.50  CREATININE 1.60*  --  1.46*  --   --  1.49*  --      Estimated Creatinine Clearance: 44 mL/min (A) (by C-G formula based on SCr of 1.49 mg/dL (H)).   Medical History: Past Medical History:  Diagnosis Date   AICD (automatic cardioverter/defibrillator) present    Arthritis    Atrial fibrillation (HCC)    Cardiomyopathy, dilated (HCC)    CHF (congestive heart failure) ([place])    Diabetes mellitus without complication ([place])    Endocarditis    Hypertension    Peripheral vascular disease (HCC)    PVC's (premature ventricular contractions)    SVT (supraventricular tachycardia)  long RP     Assessment: 75yo male to start heparin bridge for Afib while INR subtherapeutic while awaiting surgery. Patient was on PTA warfarin requiring reversal with FFP on 7/20 before foot debridement and toe amputation on 7/21. Pharmacy consulted to restart heparin infusion 4 hours post surgery per VVS.  Heparin level is therapeutic again today.   Goal of Therapy:  Heparin level 0.3-0.7 units/ml Monitor platelets by anticoagulation protocol:  Yes   Plan:  Continue heparin 1200 units/hr Daily HL and CBC  Onnie Boer, PharmD, BCIDP, AAHIVP, CPP Infectious Disease Pharmacist 04/10/2021 12:28 PM

## 2021-04-11 DIAGNOSIS — I70229 Atherosclerosis of native arteries of extremities with rest pain, unspecified extremity: Secondary | ICD-10-CM | POA: Diagnosis not present

## 2021-04-11 LAB — GLUCOSE, CAPILLARY
Glucose-Capillary: 100 mg/dL — ABNORMAL HIGH (ref 70–99)
Glucose-Capillary: 139 mg/dL — ABNORMAL HIGH (ref 70–99)
Glucose-Capillary: 102 mg/dL — ABNORMAL HIGH (ref 70–99)
Glucose-Capillary: 106 mg/dL — ABNORMAL HIGH (ref 70–99)

## 2021-04-11 LAB — CBC
HCT: 29.9 % — ABNORMAL LOW (ref 39.0–52.0)
Hemoglobin: 8.7 g/dL — ABNORMAL LOW (ref 13.0–17.0)
MCH: 23.6 pg — ABNORMAL LOW (ref 26.0–34.0)
MCHC: 29.1 g/dL — ABNORMAL LOW (ref 30.0–36.0)
MCV: 81 fL (ref 80.0–100.0)
Platelets: 151 10*3/uL (ref 150–400)
RBC: 3.69 MIL/uL — ABNORMAL LOW (ref 4.22–5.81)
RDW: 17.7 % — ABNORMAL HIGH (ref 11.5–15.5)
WBC: 7.9 10*3/uL (ref 4.0–10.5)
nRBC: 0 % (ref 0.0–0.2)

## 2021-04-11 LAB — BASIC METABOLIC PANEL
Anion gap: 8 (ref 5–15)
BUN: 27 mg/dL — ABNORMAL HIGH (ref 8–23)
CO2: 21 mmol/L — ABNORMAL LOW (ref 22–32)
Calcium: 8.5 mg/dL — ABNORMAL LOW (ref 8.9–10.3)
Chloride: 110 mmol/L (ref 98–111)
Creatinine, Ser: 1.46 mg/dL — ABNORMAL HIGH (ref 0.61–1.24)
GFR, Estimated: 49 mL/min — ABNORMAL LOW (ref >60)
Glucose, Bld: 120 mg/dL — ABNORMAL HIGH (ref 70–99)
Potassium: 4.3 mmol/L (ref 3.5–5.1)
Sodium: 139 mmol/L (ref 135–145)

## 2021-04-11 LAB — HEPARIN LEVEL (UNFRACTIONATED): Heparin Unfractionated: 0.32 IU/mL (ref 0.30–0.70)

## 2021-04-11 NOTE — Progress Notes (Signed)
PROGRESS NOTE    Ryan [name]  [id number] DOB: 11-Jun-1946 DOA: 04/08/2021 PCP: [name], MD   Brief Narrative:  Ryan [name] is a 75 y.o. male with medical history significant of afib; MVR; chronic systolic CHF with AICD in place; DM; HTN; and PVD presenting for foot debridement/possible BKA. He has had difficulty with a heel ulcer, not healing up well. Patient is s/p pop-peroneal bypass on 6/22. Ongoing R foot wound infection. He was seen day before admission with concern for infection, started on Clindamycin. He was scheduled for surgery to have it cleaned up, possible amputation on 04/08/2021 however due to elevated INR (3.2), surgery was canceled and vascular surgery recommended admission under hospitalist service. Eventually underwent right first toe partial amputation, right second toe amputation, right heel Sharp excisional debridement and wound VAC was placed on 04/09/2021.  Assessment & Plan:   Principal Problem:   Critical lower limb ischemia (HCC) Active Problems:   Chronic systolic heart failure (HCC)   Status post mitral valve replacement with bioprosthetic valve   Diabetes mellitus ([place])   Essential hypertension   ICD (implantable cardioverter-defibrillator) in place   Stage 3 chronic kidney disease ([place])  R foot wounds with critical limb ischemia: Patient is s/p pop-peroneal bypass on 6/22. Ongoing R foot wound infection. He was seen day before admission with concern for infection, started on Clinda at his facility. He was scheduled for debridement and possible BKA on 04/08/2021 however due to elevated INR, he was instead admitted under hospitalist service.  Eventually underwent right first toe partial amputation, right second toe amputation, right heel Sharp excisional debridement and wound VAC was placed on 04/09/2021.  Patient remains on Rocephin and Flagyl.  Management per vascular surgery.  Per vascular surgery note, patient is scheduled for left lower extremity  angiogram with possible intervention next week thus he will remain on heparin and inpatient.   Chronic systolic CHF with AICD: Euvolemic.  Hold Lasix. -03/04/21 echo with EF 40-45%   Afib/MVR -Bioprosthetic valve -On Coumadin, did not apparently come off pre-procedure Currently on heparin bridging.   DM type II: Hemoglobin A1c only 5.4.  Hold Jardiance and continue SSI.  Controlled.  Essential HTN: In review of last hospitalization, he was stopped from BP medications and started on Midodrine - which may need to be added back since blood pressure is labile.   HLD -Continue Lipitor   Stage 3b CKD: Better than his baseline.  DVT prophylaxis:   Heparin drip.   Code Status: Full Code  Family Communication:  None present at bedside.  Plan of care discussed with patient in length and he verbalized understanding and agreed with it.  Status is: Inpatient  Remains inpatient appropriate because:Ongoing diagnostic testing needed not appropriate for outpatient work up  Dispo: The patient is from: Home              Anticipated d/c is to: Home              Patient currently is not medically stable to d/c.   Difficult to place patient No        Estimated body mass index is 22.54 kg/m as calculated from the following:   Height as of this encounter: 6' (1.829 m).   Weight as of this encounter: 75.4 kg.      Nutritional status:  Nutrition Problem: Increased nutrient needs Etiology: post-op healing   Signs/Symptoms: estimated needs   Interventions: MVI, Juven, Magic cup, Premier Protein    Consultants:  Vascular surgery  Procedures:  As above Antimicrobials:  Anti-infectives (From admission, onward)    Start     Dose/Rate Route Frequency Ordered Stop   04/08/21 2000  cefTRIAXone (ROCEPHIN) 2 g in sodium chloride 0.9 % 100 mL IVPB       See Hyperspace for full Linked Orders Report.   2 g 200 mL/hr over 30 Minutes Intravenous Every 24 hours 04/08/21 1212 04/15/21 1959    04/08/21 2000  metroNIDAZOLE (FLAGYL) IVPB 500 mg       See Hyperspace for full Linked Orders Report.   500 mg 100 mL/hr over 60 Minutes Intravenous Every 8 hours 04/08/21 1212 04/15/21 1959   04/08/21 0823  ceFAZolin (ANCEF) IVPB 2g/100 mL premix  Status:  Discontinued        2 g 200 mL/hr over 30 Minutes Intravenous 30 min pre-op 04/08/21 [id number] 04/08/21 1212          Subjective: In and examined.  Pain controlled.  No new complaint.  Objective: Vitals:   04/10/21 2354 04/11/21 0434 04/11/21 0441 04/11/21 0823  BP: (!) 111/56 (!) 142/69  135/64  Pulse: 70 72  67  Resp: '17 16  18  '$ Temp: 98.7 F (37.1 C) 98.2 F (36.8 C)  98.6 F (37 C)  TempSrc: Oral Oral  Oral  SpO2: 97% 97%  95%  Weight:   75.4 kg   Height:        Intake/Output Summary (Last 24 hours) at 04/11/2021 1051 Last data filed at 04/11/2021 0900 Gross per 24 hour  Intake 1300 ml  Output 1475 ml  Net -175 ml    Filed Weights   04/08/21 0825 04/10/21 0435 04/11/21 0441  Weight: 73.1 kg 74.9 kg 75.4 kg    Examination:  General exam: Appears calm and comfortable  Respiratory system: Clear to auscultation. Respiratory effort normal. Cardiovascular system: S1 & S2 heard, RRR. No JVD, murmurs, rubs, gallops or clicks. No pedal edema. Gastrointestinal system: Abdomen is nondistended, soft and nontender. No organomegaly or masses felt. Normal bowel sounds heard. Central nervous system: Alert and oriented. No focal neurological deficits. Extremities: Dressing in right foot with wound VAC attached.  Dressing in left foot as well. Skin: No rashes, lesions or ulcers.  Psychiatry: Judgement and insight appear normal. Mood & affect appropriate.   Data Reviewed: I have personally reviewed following labs and imaging studies  CBC: Recent Labs  Lab 04/08/21 0826 04/09/21 0139 04/10/21 0356 04/11/21 0451  WBC 6.4 6.3 7.2 7.9  HGB 10.1* 9.0* 9.2* 8.7*  HCT 35.3* 30.0* 30.6* 29.9*  MCV 81.9 79.6* 80.3 81.0   PLT 269 230 223 [id number]    Basic Metabolic Panel: Recent Labs  Lab 04/08/21 0826 04/09/21 0139 04/10/21 0356 04/11/21 0451  NA 140 139 138 139  K 4.1 4.2 4.3 4.3  CL 105 110 109 110  CO2 26 22 21* 21*  GLUCOSE 97 97 114* 120*  BUN '19 19 19 '$ 27*  CREATININE 1.60* 1.46* 1.49* 1.46*  CALCIUM 9.2 8.7* 8.5* 8.5*    GFR: Estimated Creatinine Clearance: 45.2 mL/min (A) (by C-G formula based on SCr of 1.46 mg/dL (H)). Liver Function Tests: Recent Labs  Lab 04/08/21 0826  AST 22  ALT 18  ALKPHOS 123  BILITOT 0.6  PROT 6.6  ALBUMIN 3.2*    No results for input(s): LIPASE, AMYLASE in the last 168 hours. No results for input(s): AMMONIA in the last 168 hours. Coagulation Profile: Recent Labs  Lab 04/08/21 0826 04/08/21 2044  04/09/21 0340  INR 3.2* 2.3* 1.8*    Cardiac Enzymes: No results for input(s): CKTOTAL, CKMB, CKMBINDEX, TROPONINI in the last 168 hours. BNP (last 3 results) Recent Labs    08/25/20 1121 03/27/21 1123  PROBNP 21,395* 3,146*    HbA1C: No results for input(s): HGBA1C in the last 72 hours.  CBG: Recent Labs  Lab 04/10/21 0628 04/10/21 1116 04/10/21 1605 04/10/21 2155 04/11/21 0605  GLUCAP 118* 160* 118* 120* 100*    Lipid Profile: No results for input(s): CHOL, HDL, LDLCALC, TRIG, CHOLHDL, LDLDIRECT in the last 72 hours. Thyroid Function Tests: No results for input(s): TSH, T4TOTAL, FREET4, T3FREE, THYROIDAB in the last 72 hours. Anemia Panel: No results for input(s): VITAMINB12, FOLATE, FERRITIN, TIBC, IRON, RETICCTPCT in the last 72 hours. Sepsis Labs: Recent Labs  Lab 04/08/21 1816  LATICACIDVEN 1.6     Recent Results (from the past 240 hour(s))  SARS Coronavirus 2 by RT PCR (hospital order, performed in [facility] hospital lab) Nasopharyngeal Nasopharyngeal Swab     Status: None   Collection Time: 04/08/21  8:16 AM   Specimen: Nasopharyngeal Swab  Result Value Ref Range Status   SARS Coronavirus 2 NEGATIVE NEGATIVE Final     Comment: Performed at [facility], [place] [address], [place], [place] [address]  Surgical pcr screen     Status: None   Collection Time: 04/08/21  9:15 AM   Specimen: Nasal Mucosa; Nasal Swab  Result Value Ref Range Status   MRSA, PCR NEGATIVE NEGATIVE Final   Staphylococcus aureus NEGATIVE NEGATIVE Final    Comment: (NOTE) The Xpert SA Assay (FDA approved for NASAL specimens in patients 37 years of age and older), is one component of a comprehensive surveillance program. It is not intended to diagnose infection nor to guide or monitor treatment. Performed at [facility], [place] [address] [place], [place] [address]   Blood Cultures x 2 sites     Status: None (Preliminary result)   Collection Time: 04/08/21  6:16 PM   Specimen: BLOOD LEFT WRIST  Result Value Ref Range Status   Specimen Description BLOOD LEFT WRIST  Final   Special Requests   Final    BOTTLES DRAWN AEROBIC ONLY Blood Culture adequate volume   Culture   Final    NO GROWTH 3 DAYS Performed at [facility], [place] [address] [place], [place] [address]    Report Status PENDING  Incomplete  Blood Cultures x 2 sites     Status: None (Preliminary result)   Collection Time: 04/08/21  6:24 PM   Specimen: BLOOD RIGHT WRIST  Result Value Ref Range Status   Specimen Description BLOOD RIGHT WRIST  Final   Special Requests   Final    BOTTLES DRAWN AEROBIC ONLY Blood Culture adequate volume   Culture   Final    NO GROWTH 3 DAYS Performed at [facility], [address], [place],  [address]    Report Status PENDING  Incomplete       Radiology Studies: No results found.  Scheduled Meds:  amiodarone  200 mg Oral Daily   atorvastatin  40 mg Oral q1800   docusate sodium  100 mg Oral BID   gabapentin  300 mg Oral QHS   insulin aspart  0-15 Units Subcutaneous TID WC   multivitamin with minerals  1 tablet Oral Daily   nutrition supplement (JUVEN)  1 packet Oral BID BM   pantoprazole  40 mg  Oral Daily   Ensure Max Protein  11 oz Oral QHS   Continuous Infusions:  cefTRIAXone (ROCEPHIN)  IV 2 g (04/10/21 2039)   And   metronidazole 500 mg (04/11/21 0434)   heparin 1,200 Units/hr (04/10/21 1754)     LOS: 3 days   Time spent: 28 minutes   Darliss Cheney, MD [facility]  04/11/2021, 10:51 AM   How to contact the [facility] Attending or Consulting provider [place] or covering provider during after hours [place], for this patient?  Check the care team in [facility] and look for a) attending/consulting TRH provider listed and b) the [facility] team listed. Page or secure chat [phone]. Log into [email or web address] and use [place]'s universal password to access. If you do not have the password, please contact the hospital operator. Locate the [facility] provider you are looking for under [facility] and page to a number that you can be directly reached. If you still have difficulty reaching the provider, please page the [facility] (Director on Call) for the Hospitalists listed on amion for assistance.

## 2021-04-11 NOTE — Progress Notes (Addendum)
[facility] [place]  Subjective  - Soreness/pain in the right LE   Objective (!) 142/69 72 98.2 F (36.8 C) (Oral) 16 97%  Intake/Output Summary (Last 24 hours) at 04/11/2021 0741 Last data filed at 04/11/2021 0439 Gross per 24 hour  Intake 1300 ml  Output 1075 ml  Net 225 ml    Right LE dressing clean and dry with wound vac to suction on the heel.  Non weight bearing right LE Left GT with tip ischemic changes, no change Lungs non labored breathing  Assessment/Planning: POD #2  1) right first toe partial toe amputation 2) right second toe amputation 3) right heel sharp excisional debridement Wound vac placed to heel wound  Maintain right LE dressing VVS will change early this coming week Pending left LE angiogram with possible intervention Vac no out put, HGB 8.7 will observe, asymptomatic Cr stable 1.46, good urine OP > 1000 ml   Roxy Horseman 04/11/2021 7:41 AM --  Laboratory Lab Results: Recent Labs    04/10/21 0356 04/11/21 0451  WBC 7.2 7.9  HGB 9.2* 8.7*  HCT 30.6* 29.9*  PLT 223 151   BMET Recent Labs    04/10/21 0356 04/11/21 0451  NA 138 139  K 4.3 4.3  CL 109 110  CO2 21* 21*  GLUCOSE 114* 120*  BUN 19 27*  CREATININE 1.49* 1.46*  CALCIUM 8.5* 8.5*    COAG Lab Results  Component Value Date   INR 1.8 (H) 04/09/2021   INR 2.3 (H) 04/08/2021   INR 3.2 (H) 04/08/2021   No results found for: PTT  VASCULAR STAFF ADDENDUM: I have independently interviewed and examined the patient. I agree with the above.  Doing well after foot debridement. Will keep xeroform and Myriad on x 2 weeks. Plan to keep VAC dressing on until Monday. Left foot is worrisome as well.  Plan LLE angiogram with Dr. Donzetta Matters Monday. Needs continued heel offloading for BLE.  Regarding disposition, the patient reports he wants to move in with his son and daughter-in-law in [place], [place]. Obviously we will need to arrange for  ongoing vascular care if he does move.  [name]. [name], MD [facility] Phone Number: [phone] 04/11/2021 11:11 AM

## 2021-04-11 NOTE — Progress Notes (Signed)
Mobility Specialist: Progress Note   04/11/21 1653  Mobility  Activity Sat and stood x 3  Level of Assistance Minimal assist, patient does 75% or more  Assistive Device Front wheel walker  Mobility Out of bed to chair with meals  Mobility Response Tolerated well  Mobility performed by Mobility specialist  Bed Position Chair  $Mobility charge 1 Mobility   Pre-Mobility: 66 HR, 147/66 BP, 100% SpO2 Post-Mobility: 73 HR, 124/69 BP, 99% SpO2  Pt c/o 8-9/10 pain during session and requested pain medication, RN notified. Pt required minA to stand as well as verbal cues for NWB on RLE. Pt back in the recliner with call bell and phone in reach.   [facility] [place] Mobility Specialist Mobility Specialist Phone: [phone]

## 2021-04-11 NOTE — Progress Notes (Addendum)
ANTICOAGULATION CONSULT NOT  Pharmacy Consult for heparin Indication: atrial fibrillation  Allergies  Allergen Reactions   Tuna [Fish Allergy] Nausea And Vomiting    Patient Measurements: Height: 6' (182.9 cm) Weight: 75.4 kg (166 lb 3.6 oz) IBW/kg (Calculated) : 77.6 Heparin Dosing Weight: 73.1 kg  Vital Signs: Temp: 97.9 F (36.6 C) (07/23 1145) Temp Source: Oral (07/23 1145) BP: 125/61 (07/23 1145) Pulse Rate: 67 (07/23 1145)  Labs: Recent Labs     0000 04/08/21 2044 04/09/21 0139 04/09/21 0340 04/09/21 1755 04/10/21 0356 04/10/21 1131 04/11/21 0451  HGB   < >  --  9.0*  --   --  9.2*  --  8.7*  HCT  --   --  30.0*  --   --  30.6*  --  29.9*  PLT  --   --  230  --   --  223  --  151  LABPROT  --  25.5*  --  21.3*  --   --   --   --   INR  --  2.3*  --  1.8*  --   --   --   --   HEPARINUNFRC  --   --   --   --    < > 0.40 0.50 0.32  CREATININE  --   --  1.46*  --   --  1.49*  --  1.46*   < > = values in this interval not displayed.     Estimated Creatinine Clearance: 45.2 mL/min (A) (by C-G formula based on SCr of 1.46 mg/dL (H)).   Medical History: Past Medical History:  Diagnosis Date   AICD (automatic cardioverter/defibrillator) present    Arthritis    Atrial fibrillation (HCC)    Cardiomyopathy, dilated (HCC)    CHF (congestive heart failure) ([place])    Diabetes mellitus without complication ([place])    Endocarditis    Hypertension    Peripheral vascular disease (HCC)    PVC's (premature ventricular contractions)    SVT (supraventricular tachycardia)  long RP     Assessment: 75yo male to start heparin bridge for Afib while INR subtherapeutic while awaiting surgery. Patient was on PTA warfarin requiring reversal with FFP on 7/20 before foot debridement and toe amputation on 7/21. Warfarin still being held. Pharmacy consulted to dose heparin bridge therapy. Heparin level therapeutic at 0.32 this morning.  Goal of Therapy:  Heparin level 0.3-0.7  units/ml Monitor platelets by anticoagulation protocol: Yes   Plan:  Continue heparin 1200 units/hr Daily HL and CBC  Thank you for allowing pharmacy to participate in this patient's care.  Reatha Harps, PharmD PGY1 Pharmacy Resident 04/11/2021 12:56 PM Check [email or web address] for unit specific pharmacy number

## 2021-04-12 DIAGNOSIS — I70229 Atherosclerosis of native arteries of extremities with rest pain, unspecified extremity: Secondary | ICD-10-CM | POA: Diagnosis not present

## 2021-04-12 LAB — CBC
HCT: 30.8 % — ABNORMAL LOW (ref 39.0–52.0)
Hemoglobin: 9 g/dL — ABNORMAL LOW (ref 13.0–17.0)
MCH: 23.9 pg — ABNORMAL LOW (ref 26.0–34.0)
MCHC: 29.2 g/dL — ABNORMAL LOW (ref 30.0–36.0)
MCV: 81.9 fL (ref 80.0–100.0)
Platelets: 188 10*3/uL (ref 150–400)
RBC: 3.76 MIL/uL — ABNORMAL LOW (ref 4.22–5.81)
RDW: 18.2 % — ABNORMAL HIGH (ref 11.5–15.5)
WBC: 8 10*3/uL (ref 4.0–10.5)
nRBC: 0 % (ref 0.0–0.2)

## 2021-04-12 LAB — HEPARIN LEVEL (UNFRACTIONATED)
Heparin Unfractionated: 0.21 IU/mL — ABNORMAL LOW (ref 0.30–0.70)
Heparin Unfractionated: 0.44 IU/mL (ref 0.30–0.70)

## 2021-04-12 LAB — GLUCOSE, CAPILLARY
Glucose-Capillary: 96 mg/dL (ref 70–99)
Glucose-Capillary: 99 mg/dL (ref 70–99)
Glucose-Capillary: 91 mg/dL (ref 70–99)
Glucose-Capillary: 107 mg/dL — ABNORMAL HIGH (ref 70–99)
Glucose-Capillary: 101 mg/dL — ABNORMAL HIGH (ref 70–99)

## 2021-04-12 NOTE — Progress Notes (Signed)
PROGRESS NOTE    Ryan [name]  [id number] DOB: November 25, 1945 DOA: 04/08/2021 PCP: [name], MD   Brief Narrative:  Ryan [name] is a 75 y.o. male with medical history significant of afib; MVR; chronic systolic CHF with AICD in place; DM; HTN; and PVD presenting for foot debridement/possible BKA. He has had difficulty with a heel ulcer, not healing up well. Patient is s/p pop-peroneal bypass on 6/22. Ongoing R foot wound infection. He was seen day before admission with concern for infection, started on Clindamycin. He was scheduled for surgery to have it cleaned up, possible amputation on 04/08/2021 however due to elevated INR (3.2), surgery was canceled and vascular surgery recommended admission under hospitalist service. Eventually underwent right first toe partial amputation, right second toe amputation, right heel Sharp excisional debridement and wound VAC was placed on 04/09/2021.  Assessment & Plan:   Principal Problem:   Critical lower limb ischemia (HCC) Active Problems:   Chronic systolic heart failure (HCC)   Status post mitral valve replacement with bioprosthetic valve   Diabetes mellitus ([place])   Essential hypertension   ICD (implantable cardioverter-defibrillator) in place   Stage 3 chronic kidney disease ([place])  R foot wounds with critical limb ischemia: Patient is s/p pop-peroneal bypass on 6/22. Ongoing R foot wound infection. He was seen day before admission with concern for infection, started on Clinda at his facility. He was scheduled for debridement and possible BKA on 04/08/2021 however due to elevated INR, he was instead admitted under hospitalist service.  Eventually underwent right first toe partial amputation, right second toe amputation, right heel Sharp excisional debridement and wound VAC was placed on 04/09/2021.  Patient remains on Rocephin and Flagyl.  Management per vascular surgery.  Per vascular surgery note, patient is scheduled for left lower extremity  angiogram with possible intervention tomorrow.   Chronic systolic CHF with AICD: Euvolemic.  Hold Lasix. -03/04/21 echo with EF 40-45%   Afib/MVR -Bioprosthetic valve -On Coumadin, did not apparently come off pre-procedure Currently on heparin bridging.  Continue amiodarone.   DM type II: Hemoglobin A1c only 5.4.  Hold Jardiance and continue SSI.  Controlled.  Essential HTN: In review of last hospitalization, he was stopped from BP medications and started on Midodrine - which may need to be added back since blood pressure is labile.   HLD -Continue Lipitor   Stage 3b CKD: Better than his baseline.  DVT prophylaxis:   Heparin drip.   Code Status: Full Code  Family Communication:  None present at bedside.  Plan of care discussed with patient in length and he verbalized understanding and agreed with it.  Status is: Inpatient  Remains inpatient appropriate because:Ongoing diagnostic testing needed not appropriate for outpatient work up  Dispo: The patient is from: Home              Anticipated d/c is to: Home              Patient currently is not medically stable to d/c.   Difficult to place patient No        Estimated body mass index is 22.78 kg/m as calculated from the following:   Height as of this encounter: 6' (1.829 m).   Weight as of this encounter: 76.2 kg.      Nutritional status:  Nutrition Problem: Increased nutrient needs Etiology: post-op healing   Signs/Symptoms: estimated needs   Interventions: MVI, Juven, Magic cup, Premier Protein    Consultants:  Vascular surgery  Procedures:  As above Antimicrobials:  Anti-infectives (From admission, onward)    Start     Dose/Rate Route Frequency Ordered Stop   04/08/21 2000  cefTRIAXone (ROCEPHIN) 2 g in sodium chloride 0.9 % 100 mL IVPB       See Hyperspace for full Linked Orders Report.   2 g 200 mL/hr over 30 Minutes Intravenous Every 24 hours 04/08/21 1212 04/15/21 1959   04/08/21 2000   metroNIDAZOLE (FLAGYL) IVPB 500 mg       See Hyperspace for full Linked Orders Report.   500 mg 100 mL/hr over 60 Minutes Intravenous Every 8 hours 04/08/21 1212 04/15/21 1959   04/08/21 0823  ceFAZolin (ANCEF) IVPB 2g/100 mL premix  Status:  Discontinued        2 g 200 mL/hr over 30 Minutes Intravenous 30 min pre-op 04/08/21 [id number] 04/08/21 1212          Subjective: Seen and examined.  Complains of 4/10 pain in right lower extremity.  Objective: Vitals:   04/11/21 2314 04/12/21 0321 04/12/21 0500 04/12/21 0827  BP: 135/77 136/66  119/64  Pulse: 78 71  66  Resp: '18 17  18  '$ Temp: 98.4 F (36.9 C) 98.6 F (37 C)  98.6 F (37 C)  TempSrc: Oral Oral  Oral  SpO2: 99% 94%  95%  Weight:   76.2 kg   Height:        Intake/Output Summary (Last 24 hours) at 04/12/2021 1205 Last data filed at 04/12/2021 0829 Gross per 24 hour  Intake 889.09 ml  Output 980 ml  Net -90.91 ml    Filed Weights   04/10/21 0435 04/11/21 0441 04/12/21 0500  Weight: 74.9 kg 75.4 kg 76.2 kg    Examination:  General exam: Appears calm and comfortable  Respiratory system: Clear to auscultation. Respiratory effort normal. Cardiovascular system: S1 & S2 heard, RRR. No JVD, murmurs, rubs, gallops or clicks. No pedal edema. Gastrointestinal system: Abdomen is nondistended, soft and nontender. No organomegaly or masses felt. Normal bowel sounds heard. Central nervous system: Alert and oriented. No focal neurological deficits. Extremities: Symmetric 5 x 5 power. Skin: Dressing in the right lower extremity.  Ischemic changes at the tip of the left great toe. Psychiatry: Judgement and insight appear normal. Mood & affect appropriate.    Data Reviewed: I have personally reviewed following labs and imaging studies  CBC: Recent Labs  Lab 04/08/21 0826 04/09/21 0139 04/10/21 0356 04/11/21 0451 04/12/21 0133  WBC 6.4 6.3 7.2 7.9 8.0  HGB 10.1* 9.0* 9.2* 8.7* 9.0*  HCT 35.3* 30.0* 30.6* 29.9* 30.8*  MCV  81.9 79.6* 80.3 81.0 81.9  PLT 269 230 223 151 0000000    Basic Metabolic Panel: Recent Labs  Lab 04/08/21 0826 04/09/21 0139 04/10/21 0356 04/11/21 0451  NA 140 139 138 139  K 4.1 4.2 4.3 4.3  CL 105 110 109 110  CO2 26 22 21* 21*  GLUCOSE 97 97 114* 120*  BUN '19 19 19 '$ 27*  CREATININE 1.60* 1.46* 1.49* 1.46*  CALCIUM 9.2 8.7* 8.5* 8.5*    GFR: Estimated Creatinine Clearance: 45.7 mL/min (A) (by C-G formula based on SCr of 1.46 mg/dL (H)). Liver Function Tests: Recent Labs  Lab 04/08/21 0826  AST 22  ALT 18  ALKPHOS 123  BILITOT 0.6  PROT 6.6  ALBUMIN 3.2*    No results for input(s): LIPASE, AMYLASE in the last 168 hours. No results for input(s): AMMONIA in the last 168 hours. Coagulation Profile: Recent Labs  Lab  04/08/21 0826 04/08/21 2044 04/09/21 0340  INR 3.2* 2.3* 1.8*    Cardiac Enzymes: No results for input(s): CKTOTAL, CKMB, CKMBINDEX, TROPONINI in the last 168 hours. BNP (last 3 results) Recent Labs    08/25/20 1121 03/27/21 1123  PROBNP 21,395* 3,146*    HbA1C: No results for input(s): HGBA1C in the last 72 hours.  CBG: Recent Labs  Lab 04/11/21 1142 04/11/21 1629 04/11/21 2121 04/12/21 0556 04/12/21 0608  GLUCAP 139* 102* 106* 96 99    Lipid Profile: No results for input(s): CHOL, HDL, LDLCALC, TRIG, CHOLHDL, LDLDIRECT in the last 72 hours. Thyroid Function Tests: No results for input(s): TSH, T4TOTAL, FREET4, T3FREE, THYROIDAB in the last 72 hours. Anemia Panel: No results for input(s): VITAMINB12, FOLATE, FERRITIN, TIBC, IRON, RETICCTPCT in the last 72 hours. Sepsis Labs: Recent Labs  Lab 04/08/21 1816  LATICACIDVEN 1.6     Recent Results (from the past 240 hour(s))  SARS Coronavirus 2 by RT PCR (hospital order, performed in [facility] hospital lab) Nasopharyngeal Nasopharyngeal Swab     Status: None   Collection Time: 04/08/21  8:16 AM   Specimen: Nasopharyngeal Swab  Result Value Ref Range Status   SARS Coronavirus  2 NEGATIVE NEGATIVE Final    Comment: Performed at [facility], [place] [address], [place], [place] [address]  Surgical pcr screen     Status: None   Collection Time: 04/08/21  9:15 AM   Specimen: Nasal Mucosa; Nasal Swab  Result Value Ref Range Status   MRSA, PCR NEGATIVE NEGATIVE Final   Staphylococcus aureus NEGATIVE NEGATIVE Final    Comment: (NOTE) The Xpert SA Assay (FDA approved for NASAL specimens in patients 49 years of age and older), is one component of a comprehensive surveillance program. It is not intended to diagnose infection nor to guide or monitor treatment. Performed at [facility], [place] [address], [place], [place] [address]   Blood Cultures x 2 sites     Status: None (Preliminary result)   Collection Time: 04/08/21  6:16 PM   Specimen: BLOOD LEFT WRIST  Result Value Ref Range Status   Specimen Description BLOOD LEFT WRIST  Final   Special Requests   Final    BOTTLES DRAWN AEROBIC ONLY Blood Culture adequate volume   Culture   Final    NO GROWTH 4 DAYS Performed at [facility], [place] [address], [place], [place] [address]    Report Status PENDING  Incomplete  Blood Cultures x 2 sites     Status: None (Preliminary result)   Collection Time: 04/08/21  6:24 PM   Specimen: BLOOD RIGHT WRIST  Result Value Ref Range Status   Specimen Description BLOOD RIGHT WRIST  Final   Special Requests   Final    BOTTLES DRAWN AEROBIC ONLY Blood Culture adequate volume   Culture   Final    NO GROWTH 4 DAYS Performed at [facility], [place] [address], [place], [place] [address]    Report Status PENDING  Incomplete       Radiology Studies: No results found.  Scheduled Meds:  amiodarone  200 mg Oral Daily   atorvastatin  40 mg Oral q1800   docusate sodium  100 mg Oral BID   gabapentin  300 mg Oral QHS   insulin aspart  0-15 Units Subcutaneous TID WC   multivitamin with minerals  1 tablet Oral Daily   nutrition supplement (JUVEN)  1 packet Oral BID  BM  pantoprazole  40 mg Oral Daily   Ensure Max Protein  11 oz Oral QHS   Continuous Infusions:  cefTRIAXone (ROCEPHIN)  IV 2 g (04/11/21 2159)   And   metronidazole 500 mg (04/12/21 0335)   heparin 1,350 Units/hr (04/12/21 0318)     LOS: 4 days   Time spent: 27 minutes   Darliss Cheney, MD [facility]  04/12/2021, 12:05 PM   How to contact the [facility] Attending or Consulting provider [place] or covering provider during after hours [place], for this patient?  Check the care team in [facility] and look for a) attending/consulting [facility] provider listed and b) the [facility] team listed. Page or secure chat [phone]. Log into [email or web address] and use [place]'s universal password to access. If you do not have the password, please contact the hospital operator. Locate the [facility] provider you are looking for under [facility] and page to a number that you can be directly reached. If you still have difficulty reaching the provider, please page the [facility] (Director on Call) for the Hospitalists listed on amion for assistance.

## 2021-04-12 NOTE — Progress Notes (Addendum)
[facility] [place]  Subjective  - No new complaints, still pain/soreness in the right LE   Objective 136/66 71 98.6 F (37 C) (Oral) 17 94%  Intake/Output Summary (Last 24 hours) at 04/12/2021 0752 Last data filed at 04/12/2021 0336 Gross per 24 hour  Intake 1129.09 ml  Output 1380 ml  Net -250.91 ml   Right LE dressing clean and dry with wound vac to suction on the heel.  Non weight bearing right LE Left GT with tip ischemic changes, no change Lungs non labored breathing   Assessment/Planning: POD # 3 1) right first toe partial toe amputation 2) right second toe amputation 3) right heel sharp excisional debridement Wound vac placed to heel wound   Maintain right LE dressing VVS will change early this coming week Pending left LE angiogram with possible intervention  Cr 1.46, urine Op > 1300 ml HGB stable at 9.0, heparin full dose.  Will restart Coumadin prior to discharge. NPO past MN for angiogram 04/13/21 by Dr. Fuller Plan 04/12/2021 7:52 AM --  Laboratory Lab Results: Recent Labs    04/11/21 0451 04/12/21 0133  WBC 7.9 8.0  HGB 8.7* 9.0*  HCT 29.9* 30.8*  PLT 151 188   BMET Recent Labs    04/10/21 0356 04/11/21 0451  NA 138 139  K 4.3 4.3  CL 109 110  CO2 21* 21*  GLUCOSE 114* 120*  BUN 19 27*  CREATININE 1.49* 1.46*  CALCIUM 8.5* 8.5*    COAG Lab Results  Component Value Date   INR 1.8 (H) 04/09/2021   INR 2.3 (H) 04/08/2021   INR 3.2 (H) 04/08/2021   No results found for: PTT  VASCULAR STAFF ADDENDUM: I have independently interviewed and examined the patient. I agree with the above.  For LLE angiogram 04/13/21 with Dr. Donzetta Matters. NPO after midnight. Will change RLE outer VAC dressing tomorrow.  [name]. [name], MD [facility] Phone Number: [phone] 04/12/2021 8:44 AM

## 2021-04-12 NOTE — Progress Notes (Signed)
[place] for heparin Indication: atrial fibrillation   Labs: Recent Labs    04/09/21 0340 04/09/21 1755 04/10/21 0356 04/10/21 1131 04/11/21 0451 04/12/21 0133  HGB  --    < > 9.2*  --  8.7* 9.0*  HCT  --   --  30.6*  --  29.9* 30.8*  PLT  --   --  223  --  151 188  LABPROT 21.3*  --   --   --   --   --   INR 1.8*  --   --   --   --   --   HEPARINUNFRC  --    < > 0.40 0.50 0.32 0.21*  CREATININE  --   --  1.49*  --  1.46*  --    < > = values in this interval not displayed.   Assessment: 75yo male to start heparin bridge for Afib while INR subtherapeutic while awaiting surgery. Patient was on PTA warfarin requiring reversal with FFP on 7/20 before foot debridement and toe amputation on 7/21. Warfarin still being held. Pharmacy consulted to dose heparin bridge therapy.  Heparin level 0.21 units/ml  Goal of Therapy:  Heparin level 0.3-0.7 units/ml Monitor platelets by anticoagulation protocol: Yes   Plan:  Increase heparin to 1350 units/hr Check heparin level later today  Thanks for allowing pharmacy to be a part of this patient's care.  Excell Seltzer, PharmD Clinical Pharmacist

## 2021-04-12 NOTE — Progress Notes (Signed)
ANTICOAGULATION CONSULT NOT  Pharmacy Consult for heparin Indication: atrial fibrillation  Allergies  Allergen Reactions   Tuna [Fish Allergy] Nausea And Vomiting    Patient Measurements: Height: 6' (182.9 cm) Weight: 76.2 kg (167 lb 15.9 oz) IBW/kg (Calculated) : 77.6 Heparin Dosing Weight: 73.1 kg  Vital Signs: Temp: 98.6 F (37 C) (07/24 0827) Temp Source: Oral (07/24 0827) BP: 119/64 (07/24 0827) Pulse Rate: 66 (07/24 0827)  Labs: Recent Labs    04/10/21 0356 04/10/21 1131 04/11/21 0451 04/12/21 0133 04/12/21 0939  HGB 9.2*  --  8.7* 9.0*  --   HCT 30.6*  --  29.9* 30.8*  --   PLT 223  --  151 188  --   HEPARINUNFRC 0.40   < > 0.32 0.21* 0.44  CREATININE 1.49*  --  1.46*  --   --    < > = values in this interval not displayed.     Estimated Creatinine Clearance: 45.7 mL/min (A) (by C-G formula based on SCr of 1.46 mg/dL (H)).   Medical History: Past Medical History:  Diagnosis Date   AICD (automatic cardioverter/defibrillator) present    Arthritis    Atrial fibrillation (HCC)    Cardiomyopathy, dilated (HCC)    CHF (congestive heart failure) ([place])    Diabetes mellitus without complication ([place])    Endocarditis    Hypertension    Peripheral vascular disease (HCC)    PVC's (premature ventricular contractions)    SVT (supraventricular tachycardia)  long RP     Assessment: 75yo male to start heparin bridge for Afib while INR subtherapeutic while awaiting surgery. Patient was on PTA warfarin requiring reversal with FFP on 7/20 before foot debridement and toe amputation on 7/21. Warfarin still being held. NPO past midnight for angiogram 04/13/21 by Dr. Donzetta Matters. Pharmacy consulted to dose heparin bridge therapy. Heparin level therapeutic at 0.44 this morning.  Goal of Therapy:  Heparin level 0.3-0.7 units/ml Monitor platelets by anticoagulation protocol: Yes   Plan:  Continue heparin 1350 units/hr Daily HL and CBC  Thank you for allowing pharmacy to  participate in this patient's care.  Reatha Harps, PharmD PGY1 Pharmacy Resident 04/12/2021 10:51 AM Check [email or web address] for unit specific pharmacy number

## 2021-04-13 ENCOUNTER — Encounter (HOSPITAL_COMMUNITY): Payer: Self-pay | Admitting: Vascular Surgery

## 2021-04-13 ENCOUNTER — Encounter (HOSPITAL_COMMUNITY)
Admission: RE | Disposition: A | Discharge status: Rehabilitation Facility | Payer: Self-pay | Source: Home / Self Care | Attending: Internal Medicine

## 2021-04-13 DIAGNOSIS — I70245 Atherosclerosis of native arteries of left leg with ulceration of other part of foot: Secondary | ICD-10-CM

## 2021-04-13 DIAGNOSIS — I70229 Atherosclerosis of native arteries of extremities with rest pain, unspecified extremity: Secondary | ICD-10-CM | POA: Diagnosis not present

## 2021-04-13 DIAGNOSIS — L97529 Non-pressure chronic ulcer of other part of left foot with unspecified severity: Secondary | ICD-10-CM

## 2021-04-13 HISTORY — PX: ABDOMINAL AORTOGRAM W/LOWER EXTREMITY: CATH118223

## 2021-04-13 LAB — GLUCOSE, CAPILLARY
Glucose-Capillary: 88 mg/dL (ref 70–99)
Glucose-Capillary: 101 mg/dL — ABNORMAL HIGH (ref 70–99)
Glucose-Capillary: 111 mg/dL — ABNORMAL HIGH (ref 70–99)
Glucose-Capillary: 93 mg/dL (ref 70–99)

## 2021-04-13 LAB — CBC
HCT: 30.2 % — ABNORMAL LOW (ref 39.0–52.0)
Hemoglobin: 8.9 g/dL — ABNORMAL LOW (ref 13.0–17.0)
MCH: 23.4 pg — ABNORMAL LOW (ref 26.0–34.0)
MCHC: 29.5 g/dL — ABNORMAL LOW (ref 30.0–36.0)
MCV: 79.5 fL — ABNORMAL LOW (ref 80.0–100.0)
Platelets: 164 10*3/uL (ref 150–400)
RBC: 3.8 MIL/uL — ABNORMAL LOW (ref 4.22–5.81)
RDW: 18 % — ABNORMAL HIGH (ref 11.5–15.5)
WBC: 8.5 10*3/uL (ref 4.0–10.5)
nRBC: 0 % (ref 0.0–0.2)

## 2021-04-13 LAB — HEPARIN LEVEL (UNFRACTIONATED): Heparin Unfractionated: 0.32 IU/mL (ref 0.30–0.70)

## 2021-04-13 LAB — CULTURE, BLOOD (ROUTINE X 2)
Culture: NO GROWTH
Culture: NO GROWTH
Special Requests: ADEQUATE
Special Requests: ADEQUATE

## 2021-04-13 LAB — BASIC METABOLIC PANEL
Anion gap: 8 (ref 5–15)
BUN: 14 mg/dL (ref 8–23)
CO2: 24 mmol/L (ref 22–32)
Calcium: 8.8 mg/dL — ABNORMAL LOW (ref 8.9–10.3)
Chloride: 105 mmol/L (ref 98–111)
Creatinine, Ser: 1.23 mg/dL (ref 0.61–1.24)
GFR, Estimated: >60 mL/min (ref >60)
Glucose, Bld: 104 mg/dL — ABNORMAL HIGH (ref 70–99)
Potassium: 3.9 mmol/L (ref 3.5–5.1)
Sodium: 137 mmol/L (ref 135–145)

## 2021-04-13 LAB — PROTIME-INR
INR: 1.5 — ABNORMAL HIGH (ref 0.8–1.2)
Prothrombin Time: 17.7 seconds — ABNORMAL HIGH (ref 11.4–15.2)

## 2021-04-13 SURGERY — ABDOMINAL AORTOGRAM W/LOWER EXTREMITY
Anesthesia: LOCAL

## 2021-04-13 MED ORDER — LIDOCAINE HCL (PF) 1 % IJ SOLN
INTRAMUSCULAR | Status: AC
  Filled 2021-04-13: qty 30

## 2021-04-13 MED ORDER — LIDOCAINE HCL (PF) 1 % IJ SOLN
INTRAMUSCULAR | Status: DC | PRN
  Administered 2021-04-13: 15 mL via INTRADERMAL

## 2021-04-13 MED ORDER — IODIXANOL 320 MG/ML IV SOLN
INTRAVENOUS | Status: DC | PRN
  Administered 2021-04-13: 45 mL via INTRA_ARTERIAL

## 2021-04-13 MED ORDER — ACETAMINOPHEN 325 MG PO TABS: 650.0000 mg | ORAL_TABLET | ORAL | Status: DC | PRN

## 2021-04-13 MED ORDER — MIDAZOLAM HCL 2 MG/2ML IJ SOLN
INTRAMUSCULAR | Status: AC
  Filled 2021-04-13: qty 2

## 2021-04-13 MED ORDER — HYDRALAZINE HCL 20 MG/ML IJ SOLN: 5.0000 mg | INTRAMUSCULAR | Status: DC | PRN

## 2021-04-13 MED ORDER — SODIUM CHLORIDE 0.9% FLUSH: 3.0000 mL | INTRAVENOUS | Status: DC | PRN

## 2021-04-13 MED ORDER — OXYCODONE HCL 5 MG PO TABS: 5.0000 mg | ORAL_TABLET | ORAL | Status: DC | PRN

## 2021-04-13 MED ORDER — SODIUM CHLORIDE 0.9 % IV SOLN: INTRAVENOUS | Status: DC

## 2021-04-13 MED ORDER — ONDANSETRON HCL 4 MG/2ML IJ SOLN
4.0000 mg | Freq: Four times a day (QID) | INTRAMUSCULAR | Status: DC | PRN
  Administered 2021-04-17 – 2021-05-06 (×2): 4 mg via INTRAVENOUS
  Filled 2021-04-13 (×2): qty 2

## 2021-04-13 MED ORDER — HEPARIN (PORCINE) IN NACL 1000-0.9 UT/500ML-% IV SOLN
INTRAVENOUS | Status: AC
  Filled 2021-04-13: qty 1000

## 2021-04-13 MED ORDER — SODIUM CHLORIDE 0.9 % IV SOLN: 250.0000 mL | INTRAVENOUS | Status: DC | PRN

## 2021-04-13 MED ORDER — MIDAZOLAM HCL 2 MG/2ML IJ SOLN
INTRAMUSCULAR | Status: DC | PRN
  Administered 2021-04-13: 1 mg via INTRAVENOUS

## 2021-04-13 MED ORDER — FENTANYL CITRATE (PF) 100 MCG/2ML IJ SOLN
INTRAMUSCULAR | Status: DC | PRN
  Administered 2021-04-13: 25 ug via INTRAVENOUS

## 2021-04-13 MED ORDER — LABETALOL HCL 5 MG/ML IV SOLN
10.0000 mg | INTRAVENOUS | Status: DC | PRN
Start: 2021-04-13 — End: 2021-04-19

## 2021-04-13 MED ORDER — SODIUM CHLORIDE 0.9% FLUSH
3.0000 mL | Freq: Two times a day (BID) | INTRAVENOUS | Status: DC
  Administered 2021-04-13 – 2021-05-11 (×39): 3 mL via INTRAVENOUS
  Administered 2021-05-11: 20 mL via INTRAVENOUS
  Administered 2021-05-12 (×2): 3 mL via INTRAVENOUS

## 2021-04-13 MED ORDER — SODIUM CHLORIDE 0.9 % IV SOLN: INTRAVENOUS | Status: AC

## 2021-04-13 MED ORDER — FENTANYL CITRATE (PF) 100 MCG/2ML IJ SOLN
INTRAMUSCULAR | Status: AC
  Filled 2021-04-13: qty 2

## 2021-04-13 MED ORDER — HEPARIN (PORCINE) IN NACL 1000-0.9 UT/500ML-% IV SOLN
INTRAVENOUS | Status: DC | PRN
  Administered 2021-04-13 (×2): 500 mL

## 2021-04-13 SURGICAL SUPPLY — 12 items
CATH OMNI FLUSH 5F 65CM (CATHETERS) ×1 IMPLANT
CATH TEMPO AQUA 5F 100CM (CATHETERS) ×1 IMPLANT
CLOSURE MYNX CONTROL 5F (Vascular Products) ×1 IMPLANT
GLIDEWIRE ANGLED SS 035X260CM (WIRE) ×1 IMPLANT
KIT MICROPUNCTURE NIT STIFF (SHEATH) ×1 IMPLANT
KIT PV (KITS) ×2 IMPLANT
SHEATH PINNACLE 5F 10CM (SHEATH) ×1 IMPLANT
SHEATH PROBE COVER 6X72 (BAG) ×1 IMPLANT
SYR MEDRAD MARK V 150ML (SYRINGE) ×1 IMPLANT
TRANSDUCER W/STOPCOCK (MISCELLANEOUS) ×2 IMPLANT
TRAY PV CATH (CUSTOM PROCEDURE TRAY) ×2 IMPLANT
WIRE BENTSON .035X145CM (WIRE) ×1 IMPLANT

## 2021-04-13 NOTE — Op Note (Signed)
    Patient name: Ryan [name] MRN: [id number] DOB: Oct 19, 1945 Sex: male  04/13/2021 Pre-operative Diagnosis: Critical left lower extremity ischemia with left great toe ulceration Post-operative diagnosis:  Same Surgeon:  Erlene Quan C. Donzetta Matters, MD Procedure Performed: 1.  Ultrasound-guided cannulation right common femoral artery 2.  Selection of left common femoral artery and left popliteal artery and left lower extremity angiography 3.  Moderate sedation with fentanyl and Versed for 40  minutes 4.  Mynx device closure right common femoral artery  Indications: 75 year old male recent underwent bypass on the right to his peroneal artery and subsequent heel debridement.  He now has small ulceration on the left great toe with toe pressure of 30 known peroneal artery runoff is indicated for angiography with possible intervention.  Findings: Aorta and iliac segments were not again reevaluated with common femoral pulses that are palpable, recent angiography and known chronic kidney disease.  The left common femoral artery, SFA were patent although heavily calcified.  The above-knee popliteal artery has approximately 30% stenosis which is nonflow limiting.  Below the knee he has runoff dominant via the peroneal artery but this ends at the ankle.  The posterior tibial artery is initially patent but occludes in the mid calf and does not reconstitute.  There is reconstitution of calcaneal branch distally on the foot.  The anterior tibial artery occludes after several centimeters it is occluded for at least 20 cm and reconstitutes at a very diseased distal anterior tibial artery and runs onto the foot is a very diseased dorsalis pedis artery.  There was no indication for intervention of these vessels patient will need maximal medical therapy.   Procedure:  The patient was identified in the holding area and taken to room 8.  The patient was then placed supine on the table and prepped and draped in the usual sterile  fashion.  A time out was called.  Ultrasound was used to evaluate the right common femoral artery this was a large vessel noted to be patent and compressible.  There is no spasm present Liken cannulated micropuncture needle followed by wire and sheath using direct ultrasound access.  Images saved to the permanent record.  We placed a Bentson wire followed by 5 [place] sheath.  We directly crossed the bifurcation with Omni catheter and Bentson wire placed in the left common femoral artery.  Perform left lower extremity angiography.  With the above findings we used a Glidewire to traverse the SFA down to the above-knee popliteal artery placed a straight catheter performed angled angiography of the left foot.  There was no role for intervention.  Catheter was removed.  Make device was deployed.  He tolerated seizure without any complication   Contrast: 45 cc  Jakyia Gaccione C. Donzetta Matters, MD [facility] [place] Office: [phone] Pager: [phone]

## 2021-04-13 NOTE — Progress Notes (Signed)
No ICM remote transmission received for 04/13/2021 due to currently hospitalized and next ICM transmission scheduled for 04/27/2021.

## 2021-04-13 NOTE — Anesthesia Postprocedure Evaluation (Signed)
Anesthesia Post Note  Patient: Ryan [name]  Procedure(s) Performed: DEBRIDEMENT RIGHT HEEL WOUND AND PARTIAL FIRST TOE AMPUTATION AND SECOND TOE AMPUTATION. Application of Myriad skin substitute (Right: Foot) APPLICATION OF WOUND VAC (Right: Foot)     Patient location during evaluation: PACU Anesthesia Type: General Level of consciousness: awake and alert Pain management: pain level controlled Vital Signs Assessment: post-procedure vital signs reviewed and stable Respiratory status: spontaneous breathing, nonlabored ventilation, respiratory function stable and patient connected to nasal cannula oxygen Cardiovascular status: blood pressure returned to baseline and stable Postop Assessment: no apparent nausea or vomiting Anesthetic complications: no   No notable events documented.  Last Vitals:  Vitals:   04/13/21 0847 04/13/21 0913  BP:  124/64  Pulse: (!) 0 78  Resp: (!) 7 16  Temp:  37.2 C  SpO2: (!) 0%     Last Pain:  Vitals:   04/13/21 0913  TempSrc: Oral  PainSc:                  Dale S

## 2021-04-13 NOTE — Progress Notes (Signed)
  Progress Note    04/13/2021 7:38 AM 4 Days Post-Op  Subjective:  no overnight event.  Vitals:   04/12/21 2335 04/13/21 0502  BP: (!) 147/82 126/64  Pulse: 82   Resp: 18   Temp: 98.4 F (36.9 C) 98.8 F (37.1 C)  SpO2: 98% 92%    Physical Exam: Aaox3 Left great toe with small distal tip discoloration/ulceration  CBC    Component Value Date/Time   WBC 8.5 04/13/2021 0133   RBC 3.80 (L) 04/13/2021 0133   HGB 8.9 (L) 04/13/2021 0133   HGB 9.6 (L) 03/27/2021 1123   HCT 30.2 (L) 04/13/2021 0133   HCT 30.4 (L) 03/27/2021 1123   PLT 164 04/13/2021 0133   PLT 362 03/27/2021 1123   MCV 79.5 (L) 04/13/2021 0133   MCV 77 (L) 03/27/2021 1123   MCH 23.4 (L) 04/13/2021 0133   MCHC 29.5 (L) 04/13/2021 0133   RDW 18.0 (H) 04/13/2021 0133   RDW 17.2 (H) 03/27/2021 1123   LYMPHSABS 1,363 06/01/2016 1414   MONOABS 517 06/01/2016 1414   EOSABS 141 06/01/2016 1414   BASOSABS 47 06/01/2016 1414    BMET    Component Value Date/Time   NA 139 04/11/2021 0451   NA 142 03/27/2021 1123   K 4.3 04/11/2021 0451   CL 110 04/11/2021 0451   CO2 21 (L) 04/11/2021 0451   GLUCOSE 120 (H) 04/11/2021 0451   BUN 27 (H) 04/11/2021 0451   BUN 17 03/27/2021 1123   CREATININE 1.46 (H) 04/11/2021 0451   CREATININE 1.17 06/01/2016 1414   CALCIUM 8.5 (L) 04/11/2021 0451   GFRNONAA 49 (L) 04/11/2021 0451   GFRAA 39 (L) 10/01/2020 1157    INR    Component Value Date/Time   INR 1.8 (H) 04/09/2021 0340     Intake/Output Summary (Last 24 hours) at 04/13/2021 0738 Last data filed at 04/13/2021 0600 Gross per 24 hour  Intake 1663.18 ml  Output 950 ml  Net 713.18 ml     Assessment:  75 y.o. male is s/p right heel debridement, has left great toe ulcer.   Plan: Angiogram today to evaluate LLE and possibly improve blood flow. He has single vessel peroneal runoff from previous angiogram and will be high risk for transtibial amputation.   Reola Buckles C. Donzetta Matters, MD [facility] [place] Office: [phone] Pager: [phone]  04/13/2021 7:38 AM

## 2021-04-13 NOTE — Progress Notes (Signed)
Mobility Specialist: Progress Note   04/13/21 1538  Mobility  Activity Stood at bedside  Level of Assistance Minimal assist, patient does 75% or more  Assistive Device  (Hand Held Assist)  Mobility Out of bed to chair with meals  Mobility Response Tolerated well  Mobility performed by Mobility specialist  $Mobility charge 1 Mobility   Pt transferred back to bed and stood at bedside for his sheets to be changed. Pt was able to side step towards [facility] with minA for balance. Pt back in the bed with call bell at his side and family member present in the room.   [facility] Saliah Crisp Mobility Specialist Mobility Specialist Phone: [phone]

## 2021-04-13 NOTE — Progress Notes (Signed)
ANTICOAGULATION CONSULT NOT  Pharmacy Consult for heparin Indication: atrial fibrillation  Allergies  Allergen Reactions   Tuna [Fish Allergy] Nausea And Vomiting    Patient Measurements: Height: 6' (182.9 cm) Weight: 75.4 kg (166 lb 3.6 oz) IBW/kg (Calculated) : 77.6 Heparin Dosing Weight: 73.1 kg  Vital Signs: Temp: 98.7 F (37.1 C) (07/25 1125) Temp Source: Oral (07/25 1125) BP: 129/74 (07/25 1125) Pulse Rate: 79 (07/25 1125)  Labs: Recent Labs    04/11/21 0451 04/12/21 0133 04/12/21 0939 04/13/21 0133 04/13/21 1113  HGB 8.7* 9.0*  --  8.9*  --   HCT 29.9* 30.8*  --  30.2*  --   PLT 151 188  --  164  --   LABPROT  --   --   --   --  17.7*  INR  --   --   --   --  1.5*  HEPARINUNFRC 0.32 0.21* 0.44 0.32  --   CREATININE 1.46*  --   --   --  1.23     Estimated Creatinine Clearance: 53.6 mL/min (by C-G formula based on SCr of 1.23 mg/dL).   Medical History: Past Medical History:  Diagnosis Date   AICD (automatic cardioverter/defibrillator) present    Arthritis    Atrial fibrillation (HCC)    Cardiomyopathy, dilated (HCC)    CHF (congestive heart failure) ([place])    Diabetes mellitus without complication ([place])    Endocarditis    Hypertension    Peripheral vascular disease (HCC)    PVC's (premature ventricular contractions)    SVT (supraventricular tachycardia)  long RP     Assessment: 75yo male to start heparin bridge for Afib while INR subtherapeutic while awaiting surgery. Patient was on PTA warfarin requiring reversal with FFP on 7/20 before foot debridement and toe amputation on 7/21.  Warfarin is still being held. S/p angiogram by Dr. Donzetta Matters today. Heparin to be restarted at ~1300 per RN. Pharmacy consulted to dose heparin bridge therapy. Heparin level therapeutic at 0.32 this morning. INR 1.5 today.   Goal of Therapy:  Heparin level 0.3-0.7 units/ml Monitor platelets by anticoagulation protocol: Yes   Plan:  Continue heparin 1350 units/hr  (restart at 1300) Daily HL and CBC F/U restart of warfarin now that patient is post-procedure  Jacek Colson A. Levada Dy, PharmD, BCPS, FNKF Clinical Pharmacist [place] Please utilize Amion for appropriate phone number to reach the unit pharmacist ([place])  04/13/2021 12:37 PM

## 2021-04-13 NOTE — Progress Notes (Signed)
PROGRESS NOTE    Ryan [name]  [id number] DOB: December 24, 1945 DOA: 04/08/2021 PCP: [name], MD   Brief Narrative:  Ryan [name] is a 75 y.o. male with medical history significant of afib; MVR; chronic systolic CHF with AICD in place; DM; HTN; and PVD presenting for foot debridement/possible BKA. He has had difficulty with a heel ulcer, not healing up well. Patient is s/p pop-peroneal bypass on 6/22. Ongoing R foot wound infection. He was seen day before admission with concern for infection, started on Clindamycin. He was scheduled for surgery to have it cleaned up, possible amputation on 04/08/2021 however due to elevated INR (3.2), surgery was canceled and vascular surgery recommended admission under hospitalist service. Eventually underwent right first toe partial amputation, right second toe amputation, right heel Sharp excisional debridement and wound VAC was placed on 04/09/2021.  Underwent left lower extremity angiogram 04/13/2021.  Assessment & Plan:   Principal Problem:   Critical lower limb ischemia (HCC) Active Problems:   Chronic systolic heart failure (HCC)   Status post mitral valve replacement with bioprosthetic valve   Diabetes mellitus ([place])   Essential hypertension   ICD (implantable cardioverter-defibrillator) in place   Stage 3 chronic kidney disease ([place])  R foot wounds with critical limb ischemia: Patient is s/p pop-peroneal bypass on 6/22. Ongoing R foot wound infection. He was seen day before admission with concern for infection, started on Clinda at his facility. He was scheduled for debridement and possible BKA on 04/08/2021 however due to elevated INR, he was instead admitted under hospitalist service.  Eventually underwent right first toe partial amputation, right second toe amputation, right heel Sharp excisional debridement and wound VAC was placed on 04/09/2021.  Patient remains on Rocephin and Flagyl.  No mention of antibiotics and vascular note.  Discussed  with Dr. Gwenlyn Saran over the phone.  He recommended stopping that.  Patient also underwent left lower extremity angiogram today.  Further management per vascular surgery.   Chronic systolic CHF with AICD: Euvolemic.  Hold Lasix. -03/04/21 echo with EF 40-45%   Afib/MVR -Bioprosthetic valve -On Coumadin, did not apparently come off pre-procedure Currently on heparin bridging.  Continue amiodarone.   DM type II: Hemoglobin A1c only 5.4.  Hold Jardiance and continue SSI.  Controlled.  Essential HTN: In review of last hospitalization, he was stopped from BP medications and started on Midodrine - which may need to be added back since blood pressure is labile.   HLD -Continue Lipitor   Stage 3b CKD: Better than his baseline.  DVT prophylaxis:   Heparin drip.   Code Status: Full Code  Family Communication:  None present at bedside.  Plan of care discussed with patient in length and he verbalized understanding and agreed with it.  Status is: Inpatient  Remains inpatient appropriate because:Ongoing diagnostic testing needed not appropriate for outpatient work up  Dispo: The patient is from: Home              Anticipated d/c is to: Home              Patient currently is not medically stable to d/c.   Difficult to place patient No        Estimated body mass index is 22.54 kg/m as calculated from the following:   Height as of this encounter: 6' (1.829 m).   Weight as of this encounter: 75.4 kg.      Nutritional status:  Nutrition Problem: Increased nutrient needs Etiology: post-op healing   Signs/Symptoms:  estimated needs   Interventions: MVI, Juven, Magic cup, Premier Protein    Consultants:  Vascular surgery  Procedures:  As above Antimicrobials:  Anti-infectives (From admission, onward)    Start     Dose/Rate Route Frequency Ordered Stop   04/08/21 2000  cefTRIAXone (ROCEPHIN) 2 g in sodium chloride 0.9 % 100 mL IVPB       See Hyperspace for full Linked Orders  Report.   2 g 200 mL/hr over 30 Minutes Intravenous Every 24 hours 04/08/21 1212 04/15/21 1959   04/08/21 2000  metroNIDAZOLE (FLAGYL) IVPB 500 mg       See Hyperspace for full Linked Orders Report.   500 mg 100 mL/hr over 60 Minutes Intravenous Every 8 hours 04/08/21 1212 04/15/21 1959   04/08/21 0823  ceFAZolin (ANCEF) IVPB 2g/100 mL premix  Status:  Discontinued        2 g 200 mL/hr over 30 Minutes Intravenous 30 min pre-op 04/08/21 [id number] 04/08/21 1212          Subjective: Seen and examined after the angiogram.  No new complaint.  Objective: Vitals:   04/13/21 0837 04/13/21 0842 04/13/21 0847 04/13/21 0913  BP: (!) 161/81 (!) 159/89  124/64  Pulse: 80 79 (!) 0 78  Resp: (!) 22 11 (!) 7 16  Temp:    99 F (37.2 C)  TempSrc:    Oral  SpO2: 97% 98% (!) 0%   Weight:      Height:        Intake/Output Summary (Last 24 hours) at 04/13/2021 1127 Last data filed at 04/13/2021 0900 Gross per 24 hour  Intake 1543.18 ml  Output 950 ml  Net 593.18 ml    Filed Weights   04/11/21 0441 04/12/21 0500 04/13/21 0500  Weight: 75.4 kg 76.2 kg 75.4 kg    Examination:  General exam: Appears calm and comfortable  Respiratory system: Clear to auscultation. Respiratory effort normal. Cardiovascular system: S1 & S2 heard, RRR. No JVD, murmurs, rubs, gallops or clicks. No pedal edema. Gastrointestinal system: Abdomen is nondistended, soft and nontender. No organomegaly or masses felt. Normal bowel sounds heard. Central nervous system: Alert and oriented. No focal neurological deficits. Extremities: Symmetric 5 x 5 power.  Wound VAC on the right foot. Skin: Ischemic changes to the left great toe. Psychiatry: Judgement and insight appear normal. Mood & affect appropriate.    Data Reviewed: I have personally reviewed following labs and imaging studies  CBC: Recent Labs  Lab 04/09/21 0139 04/10/21 0356 04/11/21 0451 04/12/21 0133 04/13/21 0133  WBC 6.3 7.2 7.9 8.0 8.5  HGB 9.0*  9.2* 8.7* 9.0* 8.9*  HCT 30.0* 30.6* 29.9* 30.8* 30.2*  MCV 79.6* 80.3 81.0 81.9 79.5*  PLT 230 223 151 188 [id number]    Basic Metabolic Panel: Recent Labs  Lab 04/08/21 0826 04/09/21 0139 04/10/21 0356 04/11/21 0451  NA 140 139 138 139  K 4.1 4.2 4.3 4.3  CL 105 110 109 110  CO2 26 22 21* 21*  GLUCOSE 97 97 114* 120*  BUN '19 19 19 '$ 27*  CREATININE 1.60* 1.46* 1.49* 1.46*  CALCIUM 9.2 8.7* 8.5* 8.5*    GFR: Estimated Creatinine Clearance: 45.2 mL/min (A) (by C-G formula based on SCr of 1.46 mg/dL (H)). Liver Function Tests: Recent Labs  Lab 04/08/21 0826  AST 22  ALT 18  ALKPHOS 123  BILITOT 0.6  PROT 6.6  ALBUMIN 3.2*    No results for input(s): LIPASE, AMYLASE in the last 168 hours. No  results for input(s): AMMONIA in the last 168 hours. Coagulation Profile: Recent Labs  Lab 04/08/21 0826 04/08/21 2044 04/09/21 0340  INR 3.2* 2.3* 1.8*    Cardiac Enzymes: No results for input(s): CKTOTAL, CKMB, CKMBINDEX, TROPONINI in the last 168 hours. BNP (last 3 results) Recent Labs    08/25/20 1121 03/27/21 1123  PROBNP 21,395* 3,146*    HbA1C: No results for input(s): HGBA1C in the last 72 hours.  CBG: Recent Labs  Lab 04/12/21 1221 04/12/21 1635 04/12/21 2201 04/13/21 0614 04/13/21 1123  GLUCAP 91 107* 101* 88 101*    Lipid Profile: No results for input(s): CHOL, HDL, LDLCALC, TRIG, CHOLHDL, LDLDIRECT in the last 72 hours. Thyroid Function Tests: No results for input(s): TSH, T4TOTAL, FREET4, T3FREE, THYROIDAB in the last 72 hours. Anemia Panel: No results for input(s): VITAMINB12, FOLATE, FERRITIN, TIBC, IRON, RETICCTPCT in the last 72 hours. Sepsis Labs: Recent Labs  Lab 04/08/21 1816  LATICACIDVEN 1.6     Recent Results (from the past 240 hour(s))  SARS Coronavirus 2 by RT PCR (hospital order, performed in [facility] hospital lab) Nasopharyngeal Nasopharyngeal Swab     Status: None   Collection Time: 04/08/21  8:16 AM   Specimen:  Nasopharyngeal Swab  Result Value Ref Range Status   SARS Coronavirus 2 NEGATIVE NEGATIVE Final    Comment: Performed at [facility], [place] [address], [place], [place] [address]  Surgical pcr screen     Status: None   Collection Time: 04/08/21  9:15 AM   Specimen: Nasal Mucosa; Nasal Swab  Result Value Ref Range Status   MRSA, PCR NEGATIVE NEGATIVE Final   Staphylococcus aureus NEGATIVE NEGATIVE Final    Comment: (NOTE) The Xpert SA Assay (FDA approved for NASAL specimens in patients 77 years of age and older), is one component of a comprehensive surveillance program. It is not intended to diagnose infection nor to guide or monitor treatment. Performed at [facility], [place] [address] [place], [place] [address]   Blood Cultures x 2 sites     Status: None   Collection Time: 04/08/21  6:16 PM   Specimen: BLOOD LEFT WRIST  Result Value Ref Range Status   Specimen Description BLOOD LEFT WRIST  Final   Special Requests   Final    BOTTLES DRAWN AEROBIC ONLY Blood Culture adequate volume   Culture   Final    NO GROWTH 5 DAYS Performed at [facility], [place] [address] [place], [place] [address]    Report Status 04/13/2021 FINAL  Final  Blood Cultures x 2 sites     Status: None   Collection Time: 04/08/21  6:24 PM   Specimen: BLOOD RIGHT WRIST  Result Value Ref Range Status   Specimen Description BLOOD RIGHT WRIST  Final   Special Requests   Final    BOTTLES DRAWN AEROBIC ONLY Blood Culture adequate volume   Culture   Final    NO GROWTH 5 DAYS Performed at [facility], [place] [address], [place], [place] [address]    Report Status 04/13/2021 FINAL  Final       Radiology Studies: PERIPHERAL VASCULAR CATHETERIZATION  Result Date: 04/13/2021 Images from the original result were not included. Patient name: Ryan [name] MRN: [id number] DOB: Mar 31, 1946 Sex: male 04/13/2021 Pre-operative Diagnosis: Critical left lower extremity ischemia with left great toe  ulceration Post-operative diagnosis:  Same Surgeon:  Erlene Quan C. Donzetta Matters, MD Procedure Performed: 1.  Ultrasound-guided cannulation right common femoral  artery 2.  Selection of left common femoral artery and left popliteal artery and left lower extremity angiography 3.  Moderate sedation with fentanyl and Versed for 40  minutes 4.  Mynx device closure right common femoral artery Indications: 75 year old male recent underwent bypass on the right to his peroneal artery and subsequent heel debridement.  He now has small ulceration on the left great toe with toe pressure of 30 known peroneal artery runoff is indicated for angiography with possible intervention. Findings: Aorta and iliac segments were not again reevaluated with common femoral pulses that are palpable, recent angiography and known chronic kidney disease.  The left common femoral artery, SFA were patent although heavily calcified.  The above-knee popliteal artery has approximately 30% stenosis which is nonflow limiting.  Below the knee he has runoff dominant via the peroneal artery but this ends at the ankle.  The posterior tibial artery is initially patent but occludes in the mid calf and does not reconstitute.  There is reconstitution of calcaneal branch distally on the foot.  The anterior tibial artery occludes after several centimeters it is occluded for at least 20 cm and reconstitutes at a very diseased distal anterior tibial artery and runs onto the foot is a very diseased dorsalis pedis artery.  There was no indication for intervention of these vessels patient will need maximal medical therapy.  Procedure:  The patient was identified in the holding area and taken to room 8.  The patient was then placed supine on the table and prepped and draped in the usual sterile fashion.  A time out was called.  Ultrasound was used to evaluate the right common femoral artery this was a large vessel noted to be patent and compressible.  There is no spasm present  Liken cannulated micropuncture needle followed by wire and sheath using direct ultrasound access.  Images saved to the permanent record.  We placed a Bentson wire followed by 5 [place] sheath.  We directly crossed the bifurcation with Omni catheter and Bentson wire placed in the left common femoral artery.  Perform left lower extremity angiography.  With the above findings we used a Glidewire to traverse the SFA down to the above-knee popliteal artery placed a straight catheter performed angled angiography of the left foot.  There was no role for intervention.  Catheter was removed.  Make device was deployed.  He tolerated seizure without any complication Contrast: 45 cc Brandon C. Donzetta Matters, MD [facility] [place] Office: [phone] Pager: [phone]    Scheduled Meds:  amiodarone  200 mg Oral Daily   atorvastatin  40 mg Oral q1800   docusate sodium  100 mg Oral BID   gabapentin  300 mg Oral QHS   insulin aspart  0-15 Units Subcutaneous TID WC   multivitamin with minerals  1 tablet Oral Daily   nutrition supplement (JUVEN)  1 packet Oral BID BM   pantoprazole  40 mg Oral Daily   Ensure Max Protein  11 oz Oral QHS   sodium chloride flush  3 mL Intravenous Q12H   Continuous Infusions:  sodium chloride 75 mL/hr at 04/13/21 1016   sodium chloride     cefTRIAXone (ROCEPHIN)  IV 2 g (04/12/21 2156)   And   metronidazole 500 mg (04/13/21 0457)   heparin 1,350 Units/hr (04/12/21 1607)     LOS: 5 days   Time spent: 26 minutes   Darliss Cheney, MD [facility]  04/13/2021, 11:27 AM   How to contact the  TRH Attending or Consulting provider [place] or covering provider during after hours [place], for this patient?  Check the care team in [facility] and look for a) attending/consulting TRH provider listed and b) the [facility] team listed. Page or secure chat [phone]. Log into [email or web address] and use [place]'s universal password to access. If you do not have the password, please  contact the hospital operator. Locate the [facility] provider you are looking for under [facility] and page to a number that you can be directly reached. If you still have difficulty reaching the provider, please page the [facility] (Director on Call) for the Hospitalists listed on amion for assistance.

## 2021-04-13 NOTE — Progress Notes (Signed)
Mobility Specialist: Progress Note   04/13/21 1210  Mobility  Activity Transferred:  Bed to chair  Level of Assistance Moderate assist, patient does 50-74%  Assistive Device Front wheel walker  Distance Ambulated (ft) 2 ft  Mobility Out of bed to chair with meals  Mobility Response Tolerated well  Mobility performed by Mobility specialist  Bed Position Chair  $Mobility charge 1 Mobility   Pre-Mobility: 75 HR, 98% SpO2 Post-Mobility: 81 HR, 117/66 BP, 81% SpO2  Pt required minA for bed mobility and was modA to stand from EOB. Pt c/o pain in his R foot, no rating given. Pt is sitting in the recliner with R foot elevated and call bell in his lap.   [facility] Chrystina Naff Mobility Specialist Mobility Specialist Phone: [phone]

## 2021-04-14 ENCOUNTER — Telehealth: Payer: Self-pay

## 2021-04-14 DIAGNOSIS — I70229 Atherosclerosis of native arteries of extremities with rest pain, unspecified extremity: Secondary | ICD-10-CM | POA: Diagnosis not present

## 2021-04-14 LAB — BASIC METABOLIC PANEL
Anion gap: 9 (ref 5–15)
BUN: 18 mg/dL (ref 8–23)
CO2: 22 mmol/L (ref 22–32)
Calcium: 9 mg/dL (ref 8.9–10.3)
Chloride: 106 mmol/L (ref 98–111)
Creatinine, Ser: 1.14 mg/dL (ref 0.61–1.24)
GFR, Estimated: >60 mL/min (ref >60)
Glucose, Bld: 88 mg/dL (ref 70–99)
Potassium: 4.2 mmol/L (ref 3.5–5.1)
Sodium: 137 mmol/L (ref 135–145)

## 2021-04-14 LAB — GLUCOSE, CAPILLARY
Glucose-Capillary: 88 mg/dL (ref 70–99)
Glucose-Capillary: 114 mg/dL — ABNORMAL HIGH (ref 70–99)
Glucose-Capillary: 109 mg/dL — ABNORMAL HIGH (ref 70–99)
Glucose-Capillary: 127 mg/dL — ABNORMAL HIGH (ref 70–99)

## 2021-04-14 LAB — CBC
HCT: 35.2 % — ABNORMAL LOW (ref 39.0–52.0)
Hemoglobin: 10.7 g/dL — ABNORMAL LOW (ref 13.0–17.0)
MCH: 24.2 pg — ABNORMAL LOW (ref 26.0–34.0)
MCHC: 30.4 g/dL (ref 30.0–36.0)
MCV: 79.6 fL — ABNORMAL LOW (ref 80.0–100.0)
Platelets: 157 10*3/uL (ref 150–400)
RBC: 4.42 MIL/uL (ref 4.22–5.81)
RDW: 18.4 % — ABNORMAL HIGH (ref 11.5–15.5)
WBC: 8.3 10*3/uL (ref 4.0–10.5)
nRBC: 0 % (ref 0.0–0.2)

## 2021-04-14 LAB — HEPARIN LEVEL (UNFRACTIONATED)
Heparin Unfractionated: 0.2 IU/mL — ABNORMAL LOW (ref 0.30–0.70)
Heparin Unfractionated: 0.18 IU/mL — ABNORMAL LOW (ref 0.30–0.70)
Heparin Unfractionated: 0.34 IU/mL (ref 0.30–0.70)

## 2021-04-14 NOTE — Progress Notes (Signed)
CSW spoke with patient at bedside to bring him an appointment card for the [facility] outpatient clinic per request from Cardiology and encouraged him to follow up and to attend the appointment and bring his medications and if anything changes to please reach out so that CSW/HV clinic team can provide support. Ryan [name] reported that he might be going to [place] to live with family once he is discharged from the hospital. CSW encouraged the patient to call the clinic if needed.  Nila Winker, MSW, [place] Heart Failure Social Worker

## 2021-04-14 NOTE — Progress Notes (Signed)
Mobility Specialist: Progress Note   04/14/21 1129  Mobility  Activity Transferred:  Bed to chair  Level of Assistance Moderate assist, patient does 50-74%  Assistive Device Front wheel walker  Distance Ambulated (ft) 2 ft  Mobility Out of bed to chair with meals  Mobility Response Tolerated well  Mobility performed by Mobility specialist  Bed Position Chair  $Mobility charge 1 Mobility   Pre-Mobility: 81 HR Post-Mobility: 91 HR, 137/74 BP, 100% SpO2  Pt independent with bed mobility today and required modA to stand from EOB. Pt asx throughout ambulation. Pt has call bell and phone at his side.   [facility] Ione [place] Mobility Specialist Mobility Specialist Phone: [phone]

## 2021-04-14 NOTE — Progress Notes (Addendum)
  Progress Note    04/14/2021 7:27 AM 1 Day Post-Op  Subjective:  no complaints   Vitals:   04/13/21 1957 04/14/21 0315  BP: 128/65 (!) 148/83  Pulse:    Resp:  18  Temp: 98.7 F (37.1 C) 98.8 F (37.1 C)  SpO2: 100% 99%   Physical Exam: Lungs: non labored Incisions:  R groin cath site without hematoma; R heel with vac in place; R GT amp site darkening Extremities:  brisk L peroneal by doppler Neurologic: A&O      CBC    Component Value Date/Time   WBC 8.3 04/14/2021 0216   RBC 4.42 04/14/2021 0216   HGB 10.7 (L) 04/14/2021 0216   HGB 9.6 (L) 03/27/2021 1123   HCT 35.2 (L) 04/14/2021 0216   HCT 30.4 (L) 03/27/2021 1123   PLT 157 04/14/2021 0216   PLT 362 03/27/2021 1123   MCV 79.6 (L) 04/14/2021 0216   MCV 77 (L) 03/27/2021 1123   MCH 24.2 (L) 04/14/2021 0216   MCHC 30.4 04/14/2021 0216   RDW 18.4 (H) 04/14/2021 0216   RDW 17.2 (H) 03/27/2021 1123   LYMPHSABS 1,363 06/01/2016 1414   MONOABS 517 06/01/2016 1414   EOSABS 141 06/01/2016 1414   BASOSABS 47 06/01/2016 1414    BMET    Component Value Date/Time   NA 137 04/14/2021 0216   NA 142 03/27/2021 1123   K 4.2 04/14/2021 0216   CL 106 04/14/2021 0216   CO2 22 04/14/2021 0216   GLUCOSE 88 04/14/2021 0216   BUN 18 04/14/2021 0216   BUN 17 03/27/2021 1123   CREATININE 1.14 04/14/2021 0216   CREATININE 1.17 06/01/2016 1414   CALCIUM 9.0 04/14/2021 0216   GFRNONAA >60 04/14/2021 0216   GFRAA 39 (L) 10/01/2020 1157    INR    Component Value Date/Time   INR 1.5 (H) 04/13/2021 1113     Intake/Output Summary (Last 24 hours) at 04/14/2021 0727 Last data filed at 04/14/2021 0120 Gross per 24 hour  Intake 240 ml  Output 600 ml  Net -360 ml     Assessment/Plan:  75 y.o. male is s/p LLE arteriogram 1 Day Post-Op   R GT amp site darkening; R heel vac with good seal; patient at high risk for amputation L foot warm and well perfused with brisk peroneal doppler signal Continue therapy Continue  IV heparin for now   Dagoberto Ligas, PA-C [facility] [phone] 04/14/2021 7:27 AM  VASCULAR STAFF ADDENDUM: I have independently interviewed and examined the patient. I agree with the above.  Angiogram from yesterday reviewed in detail. No further options for revascularization. Unfortunately right great toe is dusky and appears threatened. High risk for major amputation in bilateral lower extremities. Wound care can be accomplished as an outpatient going forward. Will plan to see the patient weekly in the office to help facilitate wound care. OK for discharge from my perspective.  [name]. [name], MD [facility] Phone Number: [phone] 04/14/2021 4:41 PM

## 2021-04-14 NOTE — Progress Notes (Signed)
Mobility Specialist: Progress Note   04/14/21 1626  Mobility  Activity Transferred:  Chair to bed  Level of Assistance Moderate assist, patient does 50-74%  Assistive Device Front wheel walker  Distance Ambulated (ft) 2 ft  Mobility Out of bed to chair with meals  Mobility Response Tolerated well  Mobility performed by Mobility specialist  $Mobility charge 1 Mobility   Post-Mobility: 84 HR, 151/70 BP, 100% SpO2  Pt required two attempts to stand d/t low back pain, no rating given. Pt assisted to stand with significant trunk flexion stating he needed to sit back down. Pt assisted to stand for second bout and was able to pivot to the bed with verbal cues as well as physical assistance with RW. Pt back in the bed with call bell and phone at his side and family member present in the room.  [facility] Ananda Caya Mobility Specialist Mobility Specialist Phone: [phone]

## 2021-04-14 NOTE — Progress Notes (Signed)
[place] for heparin Indication: atrial fibrillation  Labs: Recent Labs    04/12/21 0133 04/12/21 0939 04/13/21 0133 04/13/21 1113 04/14/21 0216 04/14/21 1011  HGB 9.0*  --  8.9*  --  10.7*  --   HCT 30.8*  --  30.2*  --  35.2*  --   PLT 188  --  164  --  157  --   LABPROT  --   --   --  17.7*  --   --   INR  --   --   --  1.5*  --   --   HEPARINUNFRC 0.21*   < > 0.32  --  0.20* 0.18*  CREATININE  --   --   --  1.23 1.14  --    < > = values in this interval not displayed.   Assessment: 75yo male to start heparin bridge for Afib while INR subtherapeutic while awaiting surgery. Patient was on PTA warfarin requiring reversal with FFP on 7/20 before foot debridement and toe amputation on 7/21.  Warfarin is still being held. S/p angiogram by Dr. Donzetta Matters yesterday. Pharmacy consulted to dose heparin bridge therapy. Heparin level 0.18 units/ml this am  Goal of Therapy:  Heparin level 0.3-0.7 units/ml Monitor platelets by anticoagulation protocol: Yes   Plan:  Increase heparin to 1750 units/hr Check heparin level in 8 hours Daily HL and CBC F/U restart of warfarin now that patient is post-procedure  Beryle Zeitz A. Levada Dy, PharmD, BCPS, FNKF Clinical Pharmacist [place] Please utilize Amion for appropriate phone number to reach the unit pharmacist ([place])  04/14/2021 10:47 AM

## 2021-04-14 NOTE — Telephone Encounter (Addendum)
Patient's relative Claybon Kocik called on behalf of patient.  Patient is currently in-patient but had a cardiac appointment tomorrow.  She says Advanced Heart Failure clinic is asking our office to put in a consult so they can see Mr. Grissom while he is there.     On-call MD was paged and agreed to put in the consult.  [name] was notified and verbalized understanding.

## 2021-04-14 NOTE — Progress Notes (Signed)
[place] for heparin Indication: atrial fibrillation  Labs: Recent Labs    04/11/21 0451 04/12/21 0133 04/12/21 0939 04/13/21 0133 04/13/21 1113 04/14/21 0216  HGB 8.7* 9.0*  --  8.9*  --  10.7*  HCT 29.9* 30.8*  --  30.2*  --  35.2*  PLT 151 188  --  164  --  157  LABPROT  --   --   --   --  17.7*  --   INR  --   --   --   --  1.5*  --   HEPARINUNFRC 0.32 0.21* 0.44 0.32  --  0.20*  CREATININE 1.46*  --   --   --  1.23  --    Assessment: 75yo male to start heparin bridge for Afib while INR subtherapeutic while awaiting surgery. Patient was on PTA warfarin requiring reversal with FFP on 7/20 before foot debridement and toe amputation on 7/21.  Warfarin is still being held. S/p angiogram by Dr. Donzetta Matters today. Heparin to be restarted at ~1300 per RN. Pharmacy consulted to dose heparin bridge therapy. Heparin level 0.20 units/ml this am  Goal of Therapy:  Heparin level 0.3-0.7 units/ml Monitor platelets by anticoagulation protocol: Yes   Plan:  Increase heparin to 1500 units/hr Check heparin level in ~6hours Daily HL and CBC F/U restart of warfarin now that patient is post-procedure  Thanks for allowing pharmacy to be a part of this patient's care.  Excell Seltzer, PharmD Clinical Pharmacist 04/14/2021 3:02 AM

## 2021-04-14 NOTE — Progress Notes (Signed)
ANTICOAGULATION CONSULT - Follow Up Consult  Pharmacy Consult for IV Heparin Indication: atrial fibrillation  Total Body Weight: 80.1 kg Height: 72 inches Heparin Dosing Weight: 80.1 kg  Labs: Recent Labs    04/12/21 0133 04/12/21 0939 04/13/21 0133 04/13/21 1113 04/14/21 0216 04/14/21 1011 04/14/21 1851  HGB 9.0*  --  8.9*  --  10.7*  --   --   HCT 30.8*  --  30.2*  --  35.2*  --   --   PLT 188  --  164  --  157  --   --   LABPROT  --   --   --  17.7*  --   --   --   INR  --   --   --  1.5*  --   --   --   HEPARINUNFRC 0.21*   < > 0.32  --  0.20* 0.18* 0.34  CREATININE  --   --   --  1.23 1.14  --   --    < > = values in this interval not displayed.   Assessment: 75 yr old man male with critical limb ischemia to start heparin bridge for atrial fibrillation while INR subtherapeutic while awaiting surgery. Patient was on PTA warfarin requiring reversal with FFP on 7/20 before foot debridement and toe amputation on 7/21.  Warfarin is still being held. Pt is S/P angiogram by Dr. Donzetta Matters yesterday.   Heparin level ~7 hrs after heparin infusion was increased to 1750 units/hr was 0.34 units/ml, which is at the low end of the goal range for this pt. H/H 10.7/35.2, plt 157. Per RN, no issues with IV or bleeding observed.  Goal of Therapy:  Heparin level 0.3-0.7 units/ml Monitor platelets by anticoagulation protocol: Yes   Plan:  Increase heparin infusion to 1800 units/hr Check heparin level in 8 hours Monitor daily heparin level, CBC F/U restart of warfarin now that patient is post-procedure  Gillermina Hu, PharmD, BCPS, [facility] Clinical Pharmacist 04/14/2021 7:52 PM

## 2021-04-14 NOTE — Progress Notes (Signed)
PROGRESS NOTE    Ryan [name]  [id number] DOB: Feb 12, 1946 DOA: 04/08/2021 PCP: [name], MD   Brief Narrative:  Ryan [name] is a 75 y.o. male with medical history significant of afib; MVR; chronic systolic CHF with AICD in place; DM; HTN; and PVD presenting for foot debridement/possible BKA. He has had difficulty with a heel ulcer, not healing up well. Patient is s/p pop-peroneal bypass on 6/22. Ongoing R foot wound infection. He was seen day before admission with concern for infection, started on Clindamycin. He was scheduled for surgery to have it cleaned up, possible amputation on 04/08/2021 however due to elevated INR (3.2), surgery was canceled and vascular surgery recommended admission under hospitalist service. Eventually underwent right first toe partial amputation, right second toe amputation, right heel Sharp excisional debridement and wound VAC was placed on 04/09/2021.  Underwent left lower extremity angiogram 04/13/2021.  Assessment & Plan:   Principal Problem:   Critical lower limb ischemia (HCC) Active Problems:   Chronic systolic heart failure (HCC)   Status post mitral valve replacement with bioprosthetic valve   Diabetes mellitus ([place])   Essential hypertension   ICD (implantable cardioverter-defibrillator) in place   Stage 3 chronic kidney disease ([place])  R foot wounds with critical limb ischemia: Patient is s/p pop-peroneal bypass on 6/22. Ongoing R foot wound infection. He was seen day before admission with concern for infection, started on Clinda at his facility. He was scheduled for debridement and possible BKA on 04/08/2021 however due to elevated INR, he was instead admitted under hospitalist service.  Eventually underwent right first toe partial amputation, right second toe amputation, right heel Sharp excisional debridement and wound VAC was placed on 04/09/2021.  Discussed with Dr. Vella Redhead and antibiotic discontinued on 04/13/2021 but has recommendations.   Further management per vascular surgery.   Chronic systolic CHF with AICD: Euvolemic.  Hold Lasix. -03/04/21 echo with EF 40-45%   Afib/MVR -Bioprosthetic valve -On Coumadin prior to admission, did not apparently come off pre-procedure Currently on heparin bridging.  Continue amiodarone.   DM type II: Hemoglobin A1c only 5.4.  Hold Jardiance and continue SSI.  Controlled.  Essential HTN: In review of last hospitalization, he was stopped from BP medications and started on Midodrine - which may need to be added back since blood pressure is labile.   HLD -Continue Lipitor   Stage 3b CKD: Better than his baseline.  DVT prophylaxis:   Heparin drip.   Code Status: Full Code  Family Communication:  None present at bedside.  Plan of care discussed with patient in length and he verbalized understanding and agreed with it.  Status is: Inpatient  Remains inpatient appropriate because:Ongoing diagnostic testing needed not appropriate for outpatient work up  Dispo: The patient is from: Home              Anticipated d/c is to: Home              Patient currently is not medically stable to d/c.   Difficult to place patient No        Estimated body mass index is 23.95 kg/m as calculated from the following:   Height as of this encounter: 6' (1.829 m).   Weight as of this encounter: 80.1 kg.      Nutritional status:  Nutrition Problem: Increased nutrient needs Etiology: post-op healing   Signs/Symptoms: estimated needs   Interventions: MVI, Juven, Magic cup, Premier Protein    Consultants:  Vascular surgery  Procedures:  As above Antimicrobials:  Anti-infectives (From admission, onward)    Start     Dose/Rate Route Frequency Ordered Stop   04/08/21 2000  cefTRIAXone (ROCEPHIN) 2 g in sodium chloride 0.9 % 100 mL IVPB  Status:  Discontinued       See Hyperspace for full Linked Orders Report.   2 g 200 mL/hr over 30 Minutes Intravenous Every 24 hours 04/08/21 1212  04/13/21 1134   04/08/21 2000  metroNIDAZOLE (FLAGYL) IVPB 500 mg  Status:  Discontinued       See Hyperspace for full Linked Orders Report.   500 mg 100 mL/hr over 60 Minutes Intravenous Every 8 hours 04/08/21 1212 04/13/21 1134   04/08/21 0823  ceFAZolin (ANCEF) IVPB 2g/100 mL premix  Status:  Discontinued        2 g 200 mL/hr over 30 Minutes Intravenous 30 min pre-op 04/08/21 [id number] 04/08/21 1212          Subjective: Seen and examined.  Continues to complain of pain in the right foot but no new complaint. Objective: Vitals:   04/13/21 1957 04/14/21 0315 04/14/21 0800 04/14/21 1100  BP: 128/65 (!) 148/83 (!) 143/76 137/74  Pulse:   78 78  Resp:  '18 18 16  '$ Temp: 98.7 F (37.1 C) 98.8 F (37.1 C) 98.1 F (36.7 C) 98.4 F (36.9 C)  TempSrc: Oral Oral Oral Oral  SpO2: 100% 99% 95%   Weight:  80.1 kg    Height:        Intake/Output Summary (Last 24 hours) at 04/14/2021 1301 Last data filed at 04/14/2021 1129 Gross per 24 hour  Intake 240 ml  Output 1030 ml  Net -790 ml    Filed Weights   04/12/21 0500 04/13/21 0500 04/14/21 0315  Weight: 76.2 kg 75.4 kg 80.1 kg    Examination:  General exam: Appears calm and comfortable  Respiratory system: Clear to auscultation. Respiratory effort normal. Cardiovascular system: S1 & S2 heard, RRR. No JVD, murmurs, rubs, gallops or clicks. No pedal edema. Gastrointestinal system: Abdomen is nondistended, soft and nontender. No organomegaly or masses felt. Normal bowel sounds heard. Central nervous system: Alert and oriented. No focal neurological deficits. Extremities: Symmetric 5 x 5 power. Skin: Necrotic right great toe with wound VAC attached. Psychiatry: Judgement and insight appear normal. Mood & affect appropriate.   Data Reviewed: I have personally reviewed following labs and imaging studies  CBC: Recent Labs  Lab 04/10/21 0356 04/11/21 0451 04/12/21 0133 04/13/21 0133 04/14/21 0216  WBC 7.2 7.9 8.0 8.5 8.3  HGB  9.2* 8.7* 9.0* 8.9* 10.7*  HCT 30.6* 29.9* 30.8* 30.2* 35.2*  MCV 80.3 81.0 81.9 79.5* 79.6*  PLT 223 151 188 164 [id number]    Basic Metabolic Panel: Recent Labs  Lab 04/09/21 0139 04/10/21 0356 04/11/21 0451 04/13/21 1113 04/14/21 0216  NA 139 138 139 137 137  K 4.2 4.3 4.3 3.9 4.2  CL 110 109 110 105 106  CO2 22 21* 21* 24 22  GLUCOSE 97 114* 120* 104* 88  BUN 19 19 27* 14 18  CREATININE 1.46* 1.49* 1.46* 1.23 1.14  CALCIUM 8.7* 8.5* 8.5* 8.8* 9.0    GFR: Estimated Creatinine Clearance: 59.6 mL/min (by C-G formula based on SCr of 1.14 mg/dL). Liver Function Tests: Recent Labs  Lab 04/08/21 0826  AST 22  ALT 18  ALKPHOS 123  BILITOT 0.6  PROT 6.6  ALBUMIN 3.2*    No results for input(s): LIPASE, AMYLASE in the last 168 hours. No  results for input(s): AMMONIA in the last 168 hours. Coagulation Profile: Recent Labs  Lab 04/08/21 0826 04/08/21 2044 04/09/21 0340 04/13/21 1113  INR 3.2* 2.3* 1.8* 1.5*    Cardiac Enzymes: No results for input(s): CKTOTAL, CKMB, CKMBINDEX, TROPONINI in the last 168 hours. BNP (last 3 results) Recent Labs    08/25/20 1121 03/27/21 1123  PROBNP 21,395* 3,146*    HbA1C: No results for input(s): HGBA1C in the last 72 hours.  CBG: Recent Labs  Lab 04/13/21 1123 04/13/21 1616 04/13/21 2115 04/14/21 0646 04/14/21 1157  GLUCAP 101* 111* 93 88 114*    Lipid Profile: No results for input(s): CHOL, HDL, LDLCALC, TRIG, CHOLHDL, LDLDIRECT in the last 72 hours. Thyroid Function Tests: No results for input(s): TSH, T4TOTAL, FREET4, T3FREE, THYROIDAB in the last 72 hours. Anemia Panel: No results for input(s): VITAMINB12, FOLATE, FERRITIN, TIBC, IRON, RETICCTPCT in the last 72 hours. Sepsis Labs: Recent Labs  Lab 04/08/21 1816  LATICACIDVEN 1.6     Recent Results (from the past 240 hour(s))  SARS Coronavirus 2 by RT PCR (hospital order, performed in [facility] hospital lab) Nasopharyngeal Nasopharyngeal Swab     Status:  None   Collection Time: 04/08/21  8:16 AM   Specimen: Nasopharyngeal Swab  Result Value Ref Range Status   SARS Coronavirus 2 NEGATIVE NEGATIVE Final    Comment: Performed at [facility], [place] [address], [place], [place] [address]  Surgical pcr screen     Status: None   Collection Time: 04/08/21  9:15 AM   Specimen: Nasal Mucosa; Nasal Swab  Result Value Ref Range Status   MRSA, PCR NEGATIVE NEGATIVE Final   Staphylococcus aureus NEGATIVE NEGATIVE Final    Comment: (NOTE) The Xpert SA Assay (FDA approved for NASAL specimens in patients 3 years of age and older), is one component of a comprehensive surveillance program. It is not intended to diagnose infection nor to guide or monitor treatment. Performed at [facility], [place] [address], [place], [place] [address]   Blood Cultures x 2 sites     Status: None   Collection Time: 04/08/21  6:16 PM   Specimen: BLOOD LEFT WRIST  Result Value Ref Range Status   Specimen Description BLOOD LEFT WRIST  Final   Special Requests   Final    BOTTLES DRAWN AEROBIC ONLY Blood Culture adequate volume   Culture   Final    NO GROWTH 5 DAYS Performed at [facility], [place] [address], [place], [place] [address]    Report Status 04/13/2021 FINAL  Final  Blood Cultures x 2 sites     Status: None   Collection Time: 04/08/21  6:24 PM   Specimen: BLOOD RIGHT WRIST  Result Value Ref Range Status   Specimen Description BLOOD RIGHT WRIST  Final   Special Requests   Final    BOTTLES DRAWN AEROBIC ONLY Blood Culture adequate volume   Culture   Final    NO GROWTH 5 DAYS Performed at [facility], [place] [address] [place], [place] [address]    Report Status 04/13/2021 FINAL  Final       Radiology Studies: PERIPHERAL VASCULAR CATHETERIZATION  Result Date: 04/13/2021 Images from the original result were not included. Patient name: Ryan [name] MRN: [id number] DOB: [birth year] Sex: male 04/13/2021 Pre-operative Diagnosis:  Critical left lower extremity ischemia with left great toe ulceration Post-operative diagnosis:  Same Surgeon:  Erlene Quan C. Donzetta Matters, MD Procedure Performed: 1.  Ultrasound-guided cannulation  right common femoral artery 2.  Selection of left common femoral artery and left popliteal artery and left lower extremity angiography 3.  Moderate sedation with fentanyl and Versed for 40  minutes 4.  Mynx device closure right common femoral artery Indications: 75 year old male recent underwent bypass on the right to his peroneal artery and subsequent heel debridement.  He now has small ulceration on the left great toe with toe pressure of 30 known peroneal artery runoff is indicated for angiography with possible intervention. Findings: Aorta and iliac segments were not again reevaluated with common femoral pulses that are palpable, recent angiography and known chronic kidney disease.  The left common femoral artery, SFA were patent although heavily calcified.  The above-knee popliteal artery has approximately 30% stenosis which is nonflow limiting.  Below the knee he has runoff dominant via the peroneal artery but this ends at the ankle.  The posterior tibial artery is initially patent but occludes in the mid calf and does not reconstitute.  There is reconstitution of calcaneal branch distally on the foot.  The anterior tibial artery occludes after several centimeters it is occluded for at least 20 cm and reconstitutes at a very diseased distal anterior tibial artery and runs onto the foot is a very diseased dorsalis pedis artery.  There was no indication for intervention of these vessels patient will need maximal medical therapy.  Procedure:  The patient was identified in the holding area and taken to room 8.  The patient was then placed supine on the table and prepped and draped in the usual sterile fashion.  A time out was called.  Ultrasound was used to evaluate the right common femoral artery this was a large vessel noted to  be patent and compressible.  There is no spasm present Liken cannulated micropuncture needle followed by wire and sheath using direct ultrasound access.  Images saved to the permanent record.  We placed a Bentson wire followed by 5 [place] sheath.  We directly crossed the bifurcation with Omni catheter and Bentson wire placed in the left common femoral artery.  Perform left lower extremity angiography.  With the above findings we used a Glidewire to traverse the SFA down to the above-knee popliteal artery placed a straight catheter performed angled angiography of the left foot.  There was no role for intervention.  Catheter was removed.  Make device was deployed.  He tolerated seizure without any complication Contrast: 45 cc Brandon C. Donzetta Matters, MD [facility] Office: [phone] Pager: [phone]    Scheduled Meds:  amiodarone  200 mg Oral Daily   atorvastatin  40 mg Oral q1800   docusate sodium  100 mg Oral BID   gabapentin  300 mg Oral QHS   insulin aspart  0-15 Units Subcutaneous TID WC   multivitamin with minerals  1 tablet Oral Daily   nutrition supplement (JUVEN)  1 packet Oral BID BM   pantoprazole  40 mg Oral Daily   Ensure Max Protein  11 oz Oral QHS   sodium chloride flush  3 mL Intravenous Q12H   Continuous Infusions:  sodium chloride     heparin 1,750 Units/hr (04/14/21 1238)     LOS: 6 days   Time spent: 25 minutes   Darliss Cheney, MD [facility]  04/14/2021, 1:01 PM   How to contact the [facility] Attending or Consulting provider [place] or covering provider during after hours [place], for this patient?  Check the care team in [facility]  and look for a) attending/consulting [place] provider listed and b) the [facility] team listed. Page or secure chat [phone]. Log into [email or web address] and use [place]'s universal password to access. If you do not have the password, please contact the hospital operator. Locate the [facility] provider you are looking for under [facility] and page to a number that you can be directly reached. If you still have difficulty reaching the provider, please page the [facility] (Director on Call) for the Hospitalists listed on amion for assistance.

## 2021-04-14 NOTE — Progress Notes (Signed)
Nutrition Follow Up  DOCUMENTATION CODES:   Not applicable  INTERVENTION:   Continue Ensure MAX Protein po daily, each supplement provides 150 kcal and 30 grams of protein Continue Juven Fruit Punch BID, each serving provides 95kcal and 2.5g of protein (amino acids glutamine and arginine) Continue MVI with minerals daily   NUTRITION DIAGNOSIS:   Increased nutrient needs related to post-op healing as evidenced by estimated needs.  Ongoing  GOAL:   Patient will meet greater than or equal to 90% of their needs  Progressing   MONITOR:   PO intake, Supplement acceptance, Diet advancement, Labs, Weight trends, Skin, I & O's  REASON FOR ASSESSMENT:   Consult Assessment of nutrition requirement/status, Wound healing  ASSESSMENT:   Ryan [name] is a 75 y.o. male with medical history significant of afib; MVR; chronic systolic CHF with AICD in place; DM; HTN; and PVD presenting for foot debridement/possible BKA.    He has had difficulty with a heel ulcer, not healing up well.  He was coming in today for surgery to have it cleaned up, possible amputation.  Unable to do the surgery because his INR was too high.  He takes Coumadin for afib.  Some days he feels ok and some he doesn't.  He has not feeling like he had a fever. Pt admitted rt foot wounds with critical limb ischemia.   7/21- s/p first/second R toe amputations, R heel excisional debridement, VAC placement 7/25- s/p LLE angiography  Patient at high risk for amputation.   Patient reports appetite remains stable. Last three meal completions charted as 100%, 100%, and 75%. Taking Ensure MAX daily and Juven BID. If current intake unable to be maintained, consider transition to complete supplement.   Admission weight: 73.1 kg  Current weight: 80.1 kg   UOP: 600 ml x 24 hrs   Medications: colace, SS novolog Labs: CBG 88-114  Diet Order:   Diet Order             Diet Carb Modified Fluid consistency: Thin; Room service  appropriate? Yes  Diet effective now                   EDUCATION NEEDS:   Education needs have been addressed  Skin:  Skin Assessment: Skin Integrity Issues: Skin Integrity Issues:: Other (Comment) Other: rt foot wound with critical limb ischemia  Last BM:  7/19  Height:   Ht Readings from Last 1 Encounters:  04/08/21 6' (1.829 m)    Weight:   Wt Readings from Last 1 Encounters:  04/14/21 80.1 kg    Ideal Body Weight:  80.9 kg  BMI:  Body mass index is 23.95 kg/m.  Estimated Nutritional Needs:   Kcal:  2200-2400  Protein:  105-120 grams  Fluid:  > 2 L  Demisha Nokes MS, RD, LDN, CNSC Clinical Nutrition Pager listed in [place]

## 2021-04-15 ENCOUNTER — Encounter (HOSPITAL_COMMUNITY): Payer: Medicare HMO

## 2021-04-15 DIAGNOSIS — I70229 Atherosclerosis of native arteries of extremities with rest pain, unspecified extremity: Secondary | ICD-10-CM | POA: Diagnosis not present

## 2021-04-15 LAB — PROTIME-INR
INR: 1.4 — ABNORMAL HIGH (ref 0.8–1.2)
Prothrombin Time: 16.6 seconds — ABNORMAL HIGH (ref 11.4–15.2)

## 2021-04-15 LAB — CBC
HCT: 31.1 % — ABNORMAL LOW (ref 39.0–52.0)
Hemoglobin: 9.3 g/dL — ABNORMAL LOW (ref 13.0–17.0)
MCH: 23.7 pg — ABNORMAL LOW (ref 26.0–34.0)
MCHC: 29.9 g/dL — ABNORMAL LOW (ref 30.0–36.0)
MCV: 79.3 fL — ABNORMAL LOW (ref 80.0–100.0)
Platelets: 161 10*3/uL (ref 150–400)
RBC: 3.92 MIL/uL — ABNORMAL LOW (ref 4.22–5.81)
RDW: 18.6 % — ABNORMAL HIGH (ref 11.5–15.5)
WBC: 8.2 10*3/uL (ref 4.0–10.5)
nRBC: 0 % (ref 0.0–0.2)

## 2021-04-15 LAB — GLUCOSE, CAPILLARY
Glucose-Capillary: 95 mg/dL (ref 70–99)
Glucose-Capillary: 108 mg/dL — ABNORMAL HIGH (ref 70–99)
Glucose-Capillary: 131 mg/dL — ABNORMAL HIGH (ref 70–99)
Glucose-Capillary: 130 mg/dL — ABNORMAL HIGH (ref 70–99)

## 2021-04-15 LAB — HEPARIN LEVEL (UNFRACTIONATED): Heparin Unfractionated: 0.61 IU/mL (ref 0.30–0.70)

## 2021-04-15 MED ORDER — WARFARIN - PHARMACIST DOSING INPATIENT: Freq: Every day | Status: DC

## 2021-04-15 MED ORDER — WARFARIN SODIUM 7.5 MG PO TABS
7.5000 mg | ORAL_TABLET | Freq: Once | ORAL | Status: AC
  Administered 2021-04-15: 7.5 mg via ORAL
  Filled 2021-04-15: qty 1

## 2021-04-15 MED ORDER — FUROSEMIDE 20 MG PO TABS
20.0000 mg | ORAL_TABLET | Freq: Every day | ORAL | Status: DC
  Administered 2021-04-15 – 2021-04-16 (×2): 20 mg via ORAL
  Filled 2021-04-15 (×2): qty 1

## 2021-04-15 NOTE — Progress Notes (Signed)
ANTICOAGULATION CONSULT NOTE - Follow Up Consult  Pharmacy Consult for heparin Indication: atrial fibrillation  Labs: Recent Labs    04/13/21 0133 04/13/21 1113 04/14/21 0216 04/14/21 1011 04/14/21 1851 04/15/21 0353  HGB 8.9*  --  10.7*  --   --  9.3*  HCT 30.2*  --  35.2*  --   --  31.1*  PLT 164  --  157  --   --  161  LABPROT  --  17.7*  --   --   --   --   INR  --  1.5*  --   --   --   --   HEPARINUNFRC 0.32  --  0.20* 0.18* 0.34 0.61  CREATININE  --  1.23 1.14  --   --   --     Assessment/Plan:  75yo male therapeutic on heparin after rate change. Will continue infusion at current rate of 1800 units/hr and monitor daily level.   Wynona Neat, PharmD, BCPS  04/15/2021,4:27 AM

## 2021-04-15 NOTE — Progress Notes (Signed)
Mobility Specialist: Progress Note   04/15/21 1146  Mobility  Activity Transferred:  Bed to chair  Level of Assistance Moderate assist, patient does 50-74%  Assistive Device Front wheel walker  Distance Ambulated (ft) 4 ft  Mobility Out of bed to chair with meals  Mobility Response Tolerated well  Mobility performed by Mobility specialist  Bed Position Chair  $Mobility charge 1 Mobility   Pre-Mobility: 79 HR, 136/63 BP, 98% SpO2 Post-Mobility: 84 HR, 106/61 BP, 100% SpO2  Pt required modA to stand from EOB. Pt c/o back pain worse than yesterday, no rating given. Pt was able to pivot to the recliner with minimal bleeding from RLE, NT present. Pt is in the recliner with call bell in his lap and family member present in the room.   [facility] Raeley Gilmore Mobility Specialist Mobility Specialist Phone: [phone]

## 2021-04-15 NOTE — Evaluation (Signed)
Physical Therapy Evaluation Patient Details Name: Ryan [name] MRN: [id number] DOB: 1946-08-24 Today's Date: 04/15/2021   History of Present Illness  Ryan [name] is a 75 y.o. male with medical history significant of afib; MVR; chronic systolic CHF with AICD in place; DM; HTN; and PVD presenting for foot debridement/possible BKA.    He has had difficulty with a heel ulcer, not healing up well.  He was coming in today for surgery to have it cleaned up, possible amputation.  Unable to do the surgery because his INR was too high.  He takes Coumadin for afib.  Some days he feels ok and some he doesn't.  He has not feeling like he had a fever. 7/21 patient had partial 1st toe amp, 2nd toe amp and heel debridement. 7/25 s/p fem pop bypass.   Clinical Impression  Patient received in recliner, son present and helping to shave patient. Patient is agreeable to PT assessment. He requires mod assist and cues for sit to stand transfer from recliner. Patient got about halfway up and chair began to move, requiring mod +2 assist ( son helped) for continuing to get fully upright. Patient is unable to maintain NWB in standing. He is able to pivot to bed with mod assist and RW. Required mod assist to return to supine. Patient is unsafe and unable to return home alone at this time. Recommending SNF until he can go [place] with son. Patient will continue to benefit from skilled PT while here to improve safety, strength and functional independence.        Follow Up Recommendations SNF    Equipment Recommendations  Other (comment) (TBD next venue)    Recommendations for Other Services       Precautions / Restrictions Precautions Precautions: Fall Restrictions Weight Bearing Restrictions: Yes RLE Weight Bearing: Non weight bearing      Mobility  Bed Mobility Overal bed mobility: Needs Assistance Bed Mobility: Sit to Supine       Sit to supine: Mod assist   General bed mobility comments: patient  requires assistance to bring B LEs onto bed due to pain in back. Max assist to scoot up in bed in supine    Transfers Overall transfer level: Needs assistance Equipment used: Rolling walker (2 wheeled) Transfers: Sit to/from [facility] Sit to Stand: Mod assist;+2 physical assistance Stand pivot transfers: Mod assist;+2 physical assistance       General transfer comment: Son able to assist. Patient required mod+2 assist to come to standing. Requires cues for upright posture. WB limitations.  Ambulation/Gait Ambulation/Gait assistance: Mod assist Gait Distance (Feet): 2 Feet Assistive device: Rolling walker (2 wheeled) Gait Pattern/deviations: Shuffle;Trunk flexed Gait velocity: decr   General Gait Details: patient able to pivot to recliner, he is unable to maintain R NWB during standing or pivoting. Requires assistance to keep RW close to him and cues for safety. He was trying to sit prematurely.  Stairs            Wheelchair Mobility    Modified Rankin (Stroke Patients Only)       Balance Overall balance assessment: Needs assistance Sitting-balance support: Feet supported Sitting balance-Leahy Scale: Fair     Standing balance support: Bilateral upper extremity supported;During functional activity Standing balance-Leahy Scale: Poor Standing balance comment: Reliant on B UE support and assistance for safety and balance  Pertinent Vitals/Pain Pain Assessment: Faces Faces Pain Scale: Hurts even more Pain Location: R LE, back Pain Descriptors / Indicators: Aching;Discomfort;Grimacing;Guarding;Sore Pain Intervention(s): Patient requesting pain meds-RN notified;Limited activity within patient's tolerance;Monitored during session;Repositioned    Home Living Family/patient expects to be discharged to:: Skilled nursing facility                 Additional Comments: Planning to move to [place] with son and  his family in a few weeks.    Prior Function Level of Independence: Independent with assistive device(s)         Comments: pt uses cane for household mobility and rollator for mobility in the community. plan was to move to daughter's house in [place].     Hand Dominance   Dominant Hand: Right    Extremity/Trunk Assessment   Upper Extremity Assessment Upper Extremity Assessment: Overall WFL for tasks assessed    Lower Extremity Assessment Lower Extremity Assessment: RLE deficits/detail RLE Sensation: decreased light touch RLE Coordination: decreased gross motor    Cervical / Trunk Assessment Cervical / Trunk Assessment: Normal  Communication   Communication: No difficulties  Cognition Arousal/Alertness: Awake/alert Behavior During Therapy: WFL for tasks assessed/performed Overall Cognitive Status: Within Functional Limits for tasks assessed                                        General Comments      Exercises     Assessment/Plan    PT Assessment Patient needs continued PT services  PT Problem List Decreased strength;Decreased mobility;Decreased safety awareness;Decreased activity tolerance;Decreased balance;Pain;Decreased knowledge of use of DME;Decreased knowledge of precautions;Decreased coordination;Decreased skin integrity       PT Treatment Interventions DME instruction;Therapeutic exercise;Gait training;Balance training;Functional mobility training;Therapeutic activities;Patient/family education    PT Goals (Current goals can be found in the Care Plan section)  Acute Rehab PT Goals Patient Stated Goal: patient wants to go home PT Goal Formulation: With patient/family Time For Goal Achievement: 04/29/21 Potential to Achieve Goals: Fair    Frequency Min 2X/week   Barriers to discharge Decreased caregiver support;Inaccessible home environment 2 steps to get into home and lives alone, would have intermittent assistance from family  but no one there 24 hours a day    Co-evaluation               AM-PAC PT "6 Clicks" Mobility  Outcome Measure Help needed turning from your back to your side while in a flat bed without using bedrails?: A Lot Help needed moving from lying on your back to sitting on the side of a flat bed without using bedrails?: A Lot Help needed moving to and from a bed to a chair (including a wheelchair)?: A Lot Help needed standing up from a chair using your arms (e.g., wheelchair or bedside chair)?: A Lot Help needed to walk in hospital room?: Total Help needed climbing 3-5 steps with a railing? : Total 6 Click Score: 10    End of Session Equipment Utilized During Treatment: Gait belt Activity Tolerance: Patient limited by pain;Patient limited by fatigue Patient left: in bed;with call bell/phone within reach;with bed alarm set;with family/visitor present Nurse Communication: Mobility status PT Visit Diagnosis: Unsteadiness on feet (R26.81);Other abnormalities of gait and mobility (R26.89);Muscle weakness (generalized) (M62.81);Difficulty in walking, not elsewhere classified (R26.2);Pain Pain - Right/Left: Right Pain - part of body: Ankle and joints of foot (back)    Time:  [id number] PT Time Calculation (min) (ACUTE ONLY): 23 min   Charges:   PT Evaluation $PT Eval Moderate Complexity: 1 Mod PT Treatments $Therapeutic Activity: 8-22 mins        [employer], PT, GCS 04/15/21,2:12 PM

## 2021-04-15 NOTE — NC FL2 (Signed)
[place] LEVEL OF CARE SCREENING TOOL     IDENTIFICATION  Patient Name: Ryan [name] Birthdate: [birth year] Sex: male Admission Date (Current Location): 04/08/2021  [facility] and [place] Number:  Herbalist and Address:  The [place], [place] [address] [place], [place] [address]      Provider Number: [id number]  Attending Physician Name and Address:  Aileen Fass, Tammi Klippel, MD  Relative Name and Phone Number:       Current Level of Care: Hospital Recommended Level of Care: [place] Prior Approval Number:    Date Approved/Denied:   PASRR Number: [id number]  Discharge Plan: SNF    Current Diagnoses: Patient Active Problem List   Diagnosis Date Noted   Critical lower limb ischemia ([place]) 04/08/2021   Popliteal artery occlusion, right ([place]) 03/06/2021   PAD (peripheral artery disease) ([place]) 03/06/2021   Acute on chronic combined systolic and diastolic CHF (congestive heart failure) ([place]) 09/06/2020   CHF (congestive heart failure) ([place]) 08/26/2020   Acute combined systolic and diastolic CHF, NYHA class 3 ([place])    Stage 3 chronic kidney disease ([place])    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   PVC's (premature ventricular contractions) 06/12/2016   Junctional tachycardia ([place]) 09/16/2015   SVT (supraventricular tachycardia)  long RP    Atherosclerosis of native arteries of the extremities with ulceration ([place]) 10/23/2012   Essential hypertension 09/29/2012   Diabetes mellitus ([place]) 09/28/2012   Status post mitral valve replacement with bioprosthetic valve 09/19/2012   Acute pericarditis [id number]   Chronic systolic heart failure ([place]) 09/16/2012   Peripheral vascular disease ([place]) 09/15/2012   Acute ischemic stroke ([place]) 09/15/2012    Orientation RESPIRATION BLADDER Height & Weight     Self, Time, Situation, Place  Normal Continent Weight: 176 lb 9.4 oz (80.1 kg) Height:  6'  (182.9 cm)  BEHAVIORAL SYMPTOMS/MOOD NEUROLOGICAL BOWEL NUTRITION STATUS      Continent Diet (pleasee see discharge summary)  AMBULATORY STATUS COMMUNICATION OF NEEDS Skin   Limited Assist Verbally Surgical wounds (closed incision RT Foot, Wound/incision LFT Heel, Negative pressure wound therapy , Ankle Anterior RT Posterior)                       Personal Care Assistance Level of Assistance  Bathing, Feeding, Dressing Bathing Assistance: Limited assistance Feeding assistance: Independent Dressing Assistance: Limited assistance     Functional Limitations Info  Sight, Hearing, Speech Sight Info: Adequate Hearing Info: Adequate Speech Info: Adequate    SPECIAL CARE FACTORS FREQUENCY  PT (By licensed PT), OT (By licensed OT)     PT Frequency: 5x per week OT Frequency: 5x per week            Contractures Contractures Info: Not present    Additional Factors Info  Code Status, Allergies Code Status Info: FULl Allergies Info: Tuna           Current Medications (04/15/2021):  This is the current hospital active medication list Current Facility-Administered Medications  Medication Dose Route Frequency Provider Last Rate Last Admin   0.9 %  sodium chloride infusion  250 mL Intravenous PRN Waynetta Sandy, MD       acetaminophen (TYLENOL) tablet 650 mg  650 mg Oral Q4H PRN Waynetta Sandy, MD       amiodarone (PACERONE) tablet 200 mg  200 mg Oral Daily Waynetta Sandy, MD   200 mg at 04/15/21 0956   atorvastatin (  LIPITOR) tablet 40 mg  40 mg Oral q1800 Waynetta Sandy, MD   40 mg at 04/14/21 1853   bisacodyl (DULCOLAX) EC tablet 5 mg  5 mg Oral Daily PRN Waynetta Sandy, MD   5 mg at 04/13/21 2104   docusate sodium (COLACE) capsule 100 mg  100 mg Oral BID Waynetta Sandy, MD   100 mg at 04/15/21 [id number]   furosemide (LASIX) tablet 20 mg  20 mg Oral Daily Charlynne Cousins, MD   20 mg at 04/15/21 [id number]   gabapentin  (NEURONTIN) capsule 300 mg  300 mg Oral QHS Waynetta Sandy, MD   300 mg at 04/14/21 2202   heparin ADULT infusion 100 units/mL (25000 units/285m)  1,800 Units/hr Intravenous Continuous PDarliss Cheney MD 18 mL/hr at 04/14/21 2001 1,800 Units/hr at 04/14/21 2001   hydrALAZINE (APRESOLINE) injection 5 mg  5 mg Intravenous Q20 Min PRN CWaynetta Sandy MD       HYDROcodone-acetaminophen (NORCO/VICODIN) 5-325 MG per tablet 1-2 tablet  1-2 tablet Oral Q4H PRN CWaynetta Sandy MD   2 tablet at 04/15/21 1403   insulin aspart (novoLOG) injection 0-15 Units  0-15 Units Subcutaneous TID WC CWaynetta Sandy MD   2 Units at 04/11/21 1257   labetalol (NORMODYNE) injection 10 mg  10 mg Intravenous Q10 min PRN CWaynetta Sandy MD       morphine 2 MG/ML injection 2 mg  2 mg Intravenous Q2H PRN CWaynetta Sandy MD   2 mg at 04/13/21 0035   multivitamin with minerals tablet 1 tablet  1 tablet Oral Daily CWaynetta Sandy MD   1 tablet at 04/15/21 [id number]  nutrition supplement (JUVEN) (JUVEN) powder packet 1 packet  1 packet Oral BID BM CWaynetta Sandy MD   1 packet at 04/15/21 1403   ondansetron (ZOFRAN) injection 4 mg  4 mg Intravenous Q6H PRN CWaynetta Sandy MD       ondansetron ([facility] tablet 4 mg  4 mg Oral Q6H PRN CWaynetta Sandy MD       oxyCODONE (Oxy IR/ROXICODONE) immediate release tablet 5-10 mg  5-10 mg Oral Q4H PRN CWaynetta Sandy MD       pantoprazole (PROTONIX) EC tablet 40 mg  40 mg Oral Daily CWaynetta Sandy MD   40 mg at 04/15/21 [id number]  protein supplement (ENSURE MAX) liquid  11 oz Oral QHS CWaynetta Sandy MD   11 oz at 04/14/21 2203   sodium chloride flush (NS) 0.9 % injection 3 mL  3 mL Intravenous Q12H CWaynetta Sandy MD   3 mL at 04/15/21 1002   sodium chloride flush (NS) 0.9 % injection 3 mL  3 mL Intravenous PRN CWaynetta Sandy MD        warfarin (COUMADIN) tablet 7.5 mg  7.5 mg Oral ONCE-1600 FCharlynne Cousins MD       Warfarin - Pharmacist Dosing Inpatient   Does not apply q1600 FCharlynne Cousins MD         Discharge Medications: Please see discharge summary for a list of discharge medications.  Relevant Imaging Results:  Relevant Lab Results:   Additional Information SSN [id number]  patient will need wound vac  CVinie Sill LCSW

## 2021-04-15 NOTE — Progress Notes (Signed)
[facility] PROGRESS NOTE    Progress Note  Ryan [name]  [id number] DOB: March 19, 1946 DOA: 04/08/2021 PCP: [name], MD     Brief Narrative:   Ryan [name] is an 75 y.o. 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  Scheduled to have debridement on 04/08/2021 but due to an INR of 3.2 surgery was postponed.  He eventually underwent partial amputation with excisional debridement and wound VAC placement on 04/09/2021, left lower extremity angiogram in 04/13/2021   Assessment/Plan:   Right critical lower limb ischemia ([place]) Admit on 04/08/2021 underwent right f first and second toe amputation with right heel excisional debridement with wound VAC placement on 04/09/2021 he was also started empirically on antibiotics on admission.  And antibiotics were discontinued on 04/12/2021. Since then patient has remained afebrile. Vascular surgery recommended to follow-up with them as an outpatient in 1 week he will be discharged with wound VAC.  Hawkins to exchange wound VAC on next visit.  Chronic systolic heart failure (HCC) Lasix was held patient appears euvolemic. We will go ahead and restart his home dose of Lasix.  Permanent atrial fibrillation/Status post mitral valve replacement with bioprosthetic valve: Bioprosthetic, Coumadin held on admission he was started on IV heparin bridge. Continue amiodarone. INR not at goal at 1.5 continue IV heparin and Coumadin per pharmacy.  Diabetes mellitus type 2: With an A1c of 5.4 continue sliding scale insulin, is fairly controlled.  Essential HTN: Hold midodrine continue amiodarone blood pressure seems to be well controlled.  Hyperlipidemia: Continue  statins.  Stage 3 chronic kidney disease (HCC) Stable.  DVT prophylaxis: IV heparin with Coumadin bridging. Family Communication: None Status is: Inpatient  Remains inpatient appropriate because:Hemodynamically unstable  Dispo: The patient is from: Home              Anticipated d/c is to: Home              Patient currently is not medically stable to d/c.   Difficult to place patient No    Code Status:     Code Status Orders  (From admission, onward)           Start     Ordered   04/08/21 1211  Full code  Continuous        04/08/21 1212           Code Status History     Date Active Date Inactive Code Status Order ID Comments User Context   03/06/2021 1816 03/13/2021 0325 Full Code [id number]  Ortencia Kick Inpatient   02/25/2021 1202 02/25/2021 2000 Full Code [id number]  Cherre Robins, MD Inpatient   01/30/2021 1513 01/30/2021 2320 Full Code [id number]  Larey Dresser, MD Inpatient   08/26/2020 1818 09/06/2020 2203 Full Code [id number]  Ledora Bottcher, [place] Inpatient   06/11/2016 1627 06/12/2016 1608 Full Code [id number]  Constance Haw, MD Inpatient   08/30/2015 0114 08/30/2015 1540 Full Code [id number]  Edwin Dada, MD Inpatient   09/29/2012 1803 10/06/2012 2104 Full Code [id number]  Cephus Richer, RN Inpatient   09/26/2012 1331 09/29/2012 1803 Full Code [id number]  Lenox Ahr, RN Inpatient         IV Access:   Peripheral IV   Procedures and  diagnostic studies:   No results found.   Medical Consultants:   None.   Subjective:    Arvid Right Marini pain control no new complaints.  Objective:    Vitals:   04/14/21 1927 04/15/21 0008 04/15/21 0408 04/15/21 0737  BP: 123/73 121/69 (!) 147/69 126/66  Pulse: 82 83 79 77  Resp: '18 18 18 16  '$ Temp: 98.6 F (37 C) 99.1 F (37.3 C) 98.6 F (37 C) 98.2 F (36.8 C)  TempSrc: Oral Oral Oral Oral  SpO2: 99% 98% 96% 97%  Weight:      Height:       SpO2: 97 % O2 Flow  Rate (L/min): 2 L/min   Intake/Output Summary (Last 24 hours) at 04/15/2021 0921 Last data filed at 04/15/2021 0846 Gross per 24 hour  Intake --  Output 1340 ml  Net -1340 ml   Filed Weights   04/12/21 0500 04/13/21 0500 04/14/21 0315  Weight: 76.2 kg 75.4 kg 80.1 kg    Exam: General exam: In no acute distress. Respiratory system: Good air movement and clear to auscultation. Cardiovascular system: S1 & S2 heard, RRR. No JVD. Gastrointestinal system: Abdomen is nondistended, soft and nontender.  Extremities: No pedal edema. Skin: No rashes, lesions or ulcers Psychiatry: Judgement and insight appear normal. Mood & affect appropriate.    Data Reviewed:    Labs: Basic Metabolic Panel: Recent Labs  Lab 04/09/21 0139 04/10/21 0356 04/11/21 0451 04/13/21 1113 04/14/21 0216  NA 139 138 139 137 137  K 4.2 4.3 4.3 3.9 4.2  CL 110 109 110 105 106  CO2 22 21* 21* 24 22  GLUCOSE 97 114* 120* 104* 88  BUN 19 19 27* 14 18  CREATININE 1.46* 1.49* 1.46* 1.23 1.14  CALCIUM 8.7* 8.5* 8.5* 8.8* 9.0   GFR Estimated Creatinine Clearance: 59.6 mL/min (by C-G formula based on SCr of 1.14 mg/dL). Liver Function Tests: No results for input(s): AST, ALT, ALKPHOS, BILITOT, PROT, ALBUMIN in the last 168 hours. No results for input(s): LIPASE, AMYLASE in the last 168 hours. No results for input(s): AMMONIA in the last 168 hours. Coagulation profile Recent Labs  Lab 04/08/21 2044 04/09/21 0340 04/13/21 1113  INR 2.3* 1.8* 1.5*   COVID-19 Labs  No results for input(s): DDIMER, FERRITIN, LDH, CRP in the last 72 hours.  Lab Results  Component Value Date   SARSCOV2NAA NEGATIVE 04/08/2021   [place] NEGATIVE 03/03/2021   [place] NEGATIVE 01/28/2021   [place] NEGATIVE 08/26/2020    CBC: Recent Labs  Lab 04/11/21 0451 04/12/21 0133 04/13/21 0133 04/14/21 0216 04/15/21 0353  WBC 7.9 8.0 8.5 8.3 8.2  HGB 8.7* 9.0* 8.9* 10.7* 9.3*  HCT 29.9* 30.8* 30.2* 35.2* 31.1*   MCV 81.0 81.9 79.5* 79.6* 79.3*  PLT 151 188 164 157 161   Cardiac Enzymes: No results for input(s): CKTOTAL, CKMB, CKMBINDEX, TROPONINI in the last 168 hours. BNP (last 3 results) Recent Labs    08/25/20 1121 03/27/21 1123  PROBNP 21,395* 3,146*   CBG: Recent Labs  Lab 04/14/21 0646 04/14/21 1157 04/14/21 1702 04/14/21 2157 04/15/21 0601  GLUCAP 88 114* 109* 127* 95   D-Dimer: No results for input(s): DDIMER in the last 72 hours. Hgb A1c: No results for input(s): HGBA1C in the last 72 hours. Lipid Profile: No results for input(s): CHOL, HDL, LDLCALC, TRIG, CHOLHDL, LDLDIRECT in the last 72 hours. Thyroid function studies: No results for input(s): TSH, T4TOTAL, T3FREE, THYROIDAB in the last 72 hours.  Invalid input(s): FREET3 Anemia  work up: No results for input(s): VITAMINB12, FOLATE, FERRITIN, TIBC, IRON, RETICCTPCT in the last 72 hours. Sepsis Labs: Recent Labs  Lab 04/08/21 1816 04/09/21 0139 04/12/21 0133 04/13/21 0133 04/14/21 0216 04/15/21 0353  WBC  --    < > 8.0 8.5 8.3 8.2  LATICACIDVEN 1.6  --   --   --   --   --    < > = values in this interval not displayed.   Microbiology Recent Results (from the past 240 hour(s))  SARS Coronavirus 2 by RT PCR (hospital order, performed in [facility] hospital lab) Nasopharyngeal Nasopharyngeal Swab     Status: None   Collection Time: 04/08/21  8:16 AM   Specimen: Nasopharyngeal Swab  Result Value Ref Range Status   SARS Coronavirus 2 NEGATIVE NEGATIVE Final    Comment: Performed at [facility], [address] [place], [place] [address]  Surgical pcr screen     Status: None   Collection Time: 04/08/21  9:15 AM   Specimen: Nasal Mucosa; Nasal Swab  Result Value Ref Range Status   MRSA, PCR NEGATIVE NEGATIVE Final   Staphylococcus aureus NEGATIVE NEGATIVE Final    Comment: (NOTE) The Xpert SA Assay (FDA approved for NASAL specimens in patients 77 years of age and older), is one component of a  comprehensive surveillance program. It is not intended to diagnose infection nor to guide or monitor treatment. Performed at [facility], [place] [address] [place], [place] [address]   Blood Cultures x 2 sites     Status: None   Collection Time: 04/08/21  6:16 PM   Specimen: BLOOD LEFT WRIST  Result Value Ref Range Status   Specimen Description BLOOD LEFT WRIST  Final   Special Requests   Final    BOTTLES DRAWN AEROBIC ONLY Blood Culture adequate volume   Culture   Final    NO GROWTH 5 DAYS Performed at [facility], [place] [address], [place], [place] [address]    Report Status 04/13/2021 FINAL  Final  Blood Cultures x 2 sites     Status: None   Collection Time: 04/08/21  6:24 PM   Specimen: BLOOD RIGHT WRIST  Result Value Ref Range Status   Specimen Description BLOOD RIGHT WRIST  Final   Special Requests   Final    BOTTLES DRAWN AEROBIC ONLY Blood Culture adequate volume   Culture   Final    NO GROWTH 5 DAYS Performed at [facility], [place] [address], [place], [place] [address]    Report Status 04/13/2021 FINAL  Final     Medications:    amiodarone  200 mg Oral Daily   atorvastatin  40 mg Oral q1800   docusate sodium  100 mg Oral BID   gabapentin  300 mg Oral QHS   insulin aspart  0-15 Units Subcutaneous TID WC   multivitamin with minerals  1 tablet Oral Daily   nutrition supplement (JUVEN)  1 packet Oral BID BM   pantoprazole  40 mg Oral Daily   Ensure Max Protein  11 oz Oral QHS   sodium chloride flush  3 mL Intravenous Q12H   Continuous Infusions:  sodium chloride     heparin 1,800 Units/hr (04/14/21 2001)      LOS: 7 days   Charlynne Cousins  [facility]  04/15/2021, 9:21 AM

## 2021-04-15 NOTE — Progress Notes (Signed)
ANTICOAGULATION CONSULT - Follow Up Consult  Pharmacy Consult for IV Heparin Indication: atrial fibrillation  Total Body Weight: 80.1 kg Height: 72 inches Heparin Dosing Weight: 80.1 kg  Labs: Recent Labs    04/13/21 0133 04/13/21 1113 04/14/21 0216 04/14/21 1011 04/14/21 1851 04/15/21 0353  HGB 8.9*  --  10.7*  --   --  9.3*  HCT 30.2*  --  35.2*  --   --  31.1*  PLT 164  --  157  --   --  161  LABPROT  --  17.7*  --   --   --   --   INR  --  1.5*  --   --   --   --   HEPARINUNFRC 0.32  --  0.20* 0.18* 0.34 0.61  CREATININE  --  1.23 1.14  --   --   --    Assessment: 75 yr old man male with critical limb ischemia to start heparin bridge for atrial fibrillation while INR subtherapeutic while awaiting surgery. Patient was on PTA warfarin requiring reversal with FFP on 7/20 before foot debridement and toe amputation on 7/21.  Pt is S/P angiogram by Dr. Donzetta Matters 7/25.   No further interventions planned per vascular - okay to restart warfarin at this time per hospitalist. INR is 1.4.  Heb 9.3, plt 161. No s/sx of bleeding.   PTA regimen is warfarin 7.5 mg daily.  Goal of Therapy:  Heparin level 0.3-0.7 units/ml Monitor platelets by anticoagulation protocol: Yes   Plan:  Continue heparin infusion at 1800 units/hr Warfarin 7.5 mg tonight Monitor daily HL, INR, and for s/sx of bleeding   Antonietta Jewel, PharmD, [place] Pharmacist  Phone: [phone] 04/15/2021 9:51 AM  Please check AMION for all [place] phone numbers After 10:00 PM, call [place] [phone]

## 2021-04-15 NOTE — Consult Note (Addendum)
   [facility] Inpatient Consult   04/15/2021  CHIVAS NOTZ 08-21-1946 [id number]  [facility] [ACO] Patient: [name]  Primary Care Provider:  [name], MD   Spoke with inpatient [facility] RNCM regarding patient desire to return home for post hospital transition and eventually to his son's home in [place], [place]. Patient was assessed for [facility] for community services. Patient was previously active with [facility].  Met with patient at bedside regarding being restarted with [facility] services. Patient states he now feels that he agrees with his son and the therapist that he needs more rehab [at a facility].  Updated inpatient [facility] RNCM about patient's change of plans.  Plan:  Will continue to follow.  Of note, [facility] Care Management services does not replace or interfere with any services that are arranged by inpatient [facility] care management team.   For additional questions or referrals please contact:   Natividad Brood, RN BSN [facility] Liaison  [phone] business mobile phone Toll free office [phone]  Fax number: [phone] [email or web address]

## 2021-04-15 NOTE — TOC Initial Note (Addendum)
Transition of Care (TOC) - Initial/Assessment Note  Marvetta Gibbons RN, BSN Transitions of Care Unit 4E- RN Case Manager See Treatment Team for direct phone #    Patient Details  Name: Ryan [name] MRN: [id number] Date of Birth: Feb 10, 1946  Transition of Care [facility]) CM/SW Contact:    Dawayne Patricia, RN Phone Number: 04/15/2021, 2:18 PM  Clinical Narrative:                 Noted pt will need home wound VAC and HHRN, discussed with vascular transition plans with pt staying here to follow up with Dr. Roselie Awkward vs going to [facility] to stay with son.  Spoke with patient at the bedside along with son and sister regarding transition plans. Discussed pt's needs for transition- wound care with home wound VAC and HHRN needs as well as therapy needs.  Pt has been at SNF prior to admit and now wants to return home. Plan is for pt to go to son's home in [facility]- however pt and family agree that pt needs to stay here for now to get the care he needs prior to going to his son's home. Pt is agreeable to staying here for now to f/u with vascular here and get the [facility] services he needs.  Discussed HH choice- and pt reports he has used an agency at the beginning of the year but can not remember name- would like to use same agency- on chart review - pt used [facility].  Per pt he has walker, BSC at home.  Sister states she may be able to transport home, if not then pt may need transportation assistance.  Pt may also need transportation assistance to doctor's appointments. Have spoken with [facility] - pt has transportation w/ insurance (12 per year)- [facility] will see pt and f/u on transport needs.   [employer] home wound VAC order form has been signed and faxed to [facility] for insurance approval- msg sent to [place] with [employer] regarding pt's home wound VAC needs. Once approved will be delivered here to bedside.   Call made to [facility] with [facility] for HHRN/PT/OT needs- referral has been accepted and per Tommi Rumps they can do a start of care for  home  wound vac drsg needs on Friday 7/29.   Pt's transition home pending home wound VAC approval and delivery.   1500- noted PT eval note recs for SNF- spoke with pt at bedside to confirm plans Home vs SNF- per pt he reports that he did not do well with PT and he and his son agree that he needs to go back to rehab. Pt asks that search be done for another SNF, he would prefer somewhere other than [facility] if possible.  CSW notified for SNF workup- pt may be in copay days- CSW to follow up with pt and son. Disp pending SNF bed/insurance auth  Notified [employer] and [facility] to put a hold on home wound VAC and HH needs at this time.    Expected Discharge Plan: [place] Services Barriers to Discharge: Other (must enter comment), Continued Medical Work up (Awaiting Wound Vac approval and  homehealth pending)   Patient Goals and CMS Choice Patient states their goals for this hospitalization and ongoing recovery are:: To go home CMS Medicare.gov Compare Post Acute Care list provided to:: Patient Choice offered to / list presented to : Patient, Adult Children, Sibling (Son and sister)  Expected Discharge Plan and Services Expected Discharge Plan: [place]  Services In-house Referral: [facility] Discharge Planning Services: CM Consult Post Acute Care Choice: [place] arrangements for the past 2 months: [place]                 DME Arranged: Negative pressure wound device, Vac ([place]) DME Agency: Other - Comment, [employer] Date DME Agency Contacted: 04/15/21 Time DME Agency Contacted: 1200 Representative spoke with at DME Agency: Olivia Mackie HH Arranged: RN, PT, OT [facility] Agency: [place] Date [place]: 04/15/21 Time [place]: 71 Representative spoke with at [place]: [place] Arrangements/Services Living arrangements for the past 2 months: [place] with:: Self Patient language  and need for interpreter reviewed:: No Do you feel safe going back to the place where you live?: Yes      Need for Family Participation in Patient Care: Yes (Comment) Care giver support system in place?: Yes (comment) Current home services: DME Criminal Activity/Legal Involvement Pertinent to Current Situation/Hospitalization: No - Comment as needed  Activities of Daily Living Home Assistive Devices/Equipment: Wheelchair ADL Screening (condition at time of admission) Patient's cognitive ability adequate to safely complete daily activities?: Yes Is the patient deaf or have difficulty hearing?: Yes Does the patient have difficulty seeing, even when wearing glasses/contacts?: No Does the patient have difficulty concentrating, remembering, or making decisions?: Yes Patient able to express need for assistance with ADLs?: Yes Does the patient have difficulty dressing or bathing?: Yes Independently performs ADLs?: No Communication: Independent Dressing (OT): Needs assistance Is this a change from baseline?: Pre-admission baseline Grooming: Needs assistance Is this a change from baseline?: Pre-admission baseline Feeding: Independent Bathing: Needs assistance Is this a change from baseline?: Pre-admission baseline Toileting: Needs assistance Is this a change from baseline?: Pre-admission baseline In/Out Bed: Needs assistance Is this a change from baseline?: Pre-admission baseline Walks in Home: Needs assistance Is this a change from baseline?: Pre-admission baseline Does the patient have difficulty walking or climbing stairs?: Yes Weakness of Legs: Both Weakness of Arms/Hands: None  Permission Sought/Granted Permission sought to share information with : Case Manager, Customer service manager Permission granted to share information with : Yes, Verbal Permission Granted     Permission granted to share info w AGENCY: HH/DME        Emotional Assessment Appearance:: Appears  stated age Attitude/Demeanor/Rapport: Engaged Affect (typically observed): Accepting Orientation: : Oriented to Self, Oriented to Place, Oriented to  Time, Oriented to Situation Alcohol / Substance Use: Not Applicable Psych Involvement: No (comment)  Admission diagnosis:  Critical lower limb ischemia ([place]) [I70.229] Patient Active Problem List   Diagnosis Date Noted   Critical lower limb ischemia ([place]) 04/08/2021   Popliteal artery occlusion, right ([place]) 03/06/2021   PAD (peripheral artery disease) ([place]) 03/06/2021   Acute on chronic combined systolic and diastolic CHF (congestive heart failure) ([place]) 09/06/2020   CHF (congestive heart failure) ([place]) 08/26/2020   Acute combined systolic and diastolic CHF, NYHA class 3 ([place])    Stage 3 chronic kidney disease ([place])    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   PVC's (premature ventricular contractions) 06/12/2016   Junctional tachycardia ([place]) 09/16/2015   SVT (supraventricular tachycardia)  long RP    Atherosclerosis of native arteries of the extremities with ulceration ([place]) 10/23/2012   Essential hypertension 09/29/2012    Class: Chronic   Diabetes mellitus ([place]) 09/28/2012   Status post mitral valve replacement with bioprosthetic valve 09/19/2012    Class: Acute   Acute  pericarditis 09/16/2012    Class: Acute   Chronic systolic heart failure ([place]) 09/16/2012    Class: Acute   Peripheral vascular disease ([place]) 09/15/2012   Acute ischemic stroke ([place]) 09/15/2012   PCP:  [name], MD Pharmacy:   [id number] [place] [id number]: [phone]: [phone]    Social Determinants of Health (SDOH) Interventions    Readmission Risk Interventions Readmission Risk Prevention Plan 04/15/2021  Home Care Screening Complete  Medication Review (RN CM) Complete  Some recent data might be hidden

## 2021-04-15 NOTE — Progress Notes (Signed)
[facility]  Subjective  - No new complaints   Objective (!) 147/69 79 98.6 F (37 C) (Oral) 18 96%  Intake/Output Summary (Last 24 hours) at 04/15/2021 [id number] Last data filed at 04/15/2021 0408 Gross per 24 hour  Intake --  Output 1265 ml  Net -1265 ml    Doppler PT right LE, no change in dusky/dark appearence to GT amp site Heel wound vac in place with good suction Left peroneal signal Lungs non labored breathing   Assessment/Planning: 75 y.o. male is s/p LLE arteriogram 1) right first toe partial toe amputation 2) right second toe amputation 3) right heel sharp excisional debridement Wound vac placed to heel wound  No further options for revascularization.  OK for discharge from a vascular point of view Plan for weekly f/u visits for wound check I will ask DR. Hawken to let us know when the wound vac is changed next and how often.   Roxy Horseman 04/15/2021 7:14 AM --  Laboratory Lab Results: Recent Labs    04/14/21 0216 04/15/21 0353  WBC 8.3 8.2  HGB 10.7* 9.3*  HCT 35.2* 31.1*  PLT 157 161   BMET Recent Labs    04/13/21 1113 04/14/21 0216  NA 137 137  K 3.9 4.2  CL 105 106  CO2 24 22  GLUCOSE 104* 88  BUN 14 18  CREATININE 1.23 1.14  CALCIUM 8.8* 9.0    COAG Lab Results  Component Value Date   INR 1.5 (H) 04/13/2021   INR 1.8 (H) 04/09/2021   INR 2.3 (H) 04/08/2021   No results found for: PTT

## 2021-04-16 ENCOUNTER — Inpatient Hospital Stay (HOSPITAL_COMMUNITY): Payer: Medicare HMO

## 2021-04-16 DIAGNOSIS — I70229 Atherosclerosis of native arteries of extremities with rest pain, unspecified extremity: Secondary | ICD-10-CM | POA: Diagnosis not present

## 2021-04-16 LAB — GLUCOSE, CAPILLARY
Glucose-Capillary: 101 mg/dL — ABNORMAL HIGH (ref 70–99)
Glucose-Capillary: 126 mg/dL — ABNORMAL HIGH (ref 70–99)
Glucose-Capillary: 139 mg/dL — ABNORMAL HIGH (ref 70–99)
Glucose-Capillary: 117 mg/dL — ABNORMAL HIGH (ref 70–99)

## 2021-04-16 LAB — CBC
HCT: 28.9 % — ABNORMAL LOW (ref 39.0–52.0)
Hemoglobin: 8.9 g/dL — ABNORMAL LOW (ref 13.0–17.0)
MCH: 24.5 pg — ABNORMAL LOW (ref 26.0–34.0)
MCHC: 30.8 g/dL (ref 30.0–36.0)
MCV: 79.4 fL — ABNORMAL LOW (ref 80.0–100.0)
Platelets: 193 10*3/uL (ref 150–400)
RBC: 3.64 MIL/uL — ABNORMAL LOW (ref 4.22–5.81)
RDW: 18.6 % — ABNORMAL HIGH (ref 11.5–15.5)
WBC: 8.3 10*3/uL (ref 4.0–10.5)
nRBC: 0 % (ref 0.0–0.2)

## 2021-04-16 LAB — HEPARIN LEVEL (UNFRACTIONATED): Heparin Unfractionated: 0.52 IU/mL (ref 0.30–0.70)

## 2021-04-16 MED ORDER — POLYETHYLENE GLYCOL 3350 17 G PO PACK
17.0000 g | PACK | Freq: Every day | ORAL | Status: DC | PRN
  Administered 2021-04-16 – 2021-05-18 (×2): 17 g via ORAL
  Filled 2021-04-16 (×3): qty 1

## 2021-04-16 MED ORDER — OXYCODONE HCL 5 MG PO TABS
10.0000 mg | ORAL_TABLET | ORAL | Status: DC | PRN
  Administered 2021-04-16 – 2021-04-17 (×5): 10 mg via ORAL
  Filled 2021-04-16 (×5): qty 2

## 2021-04-16 MED ORDER — WARFARIN SODIUM 7.5 MG PO TABS
7.5000 mg | ORAL_TABLET | Freq: Once | ORAL | Status: AC
  Administered 2021-04-16: 7.5 mg via ORAL
  Filled 2021-04-16: qty 1

## 2021-04-16 MED ORDER — METHOCARBAMOL 500 MG PO TABS
500.0000 mg | ORAL_TABLET | Freq: Three times a day (TID) | ORAL | Status: DC | PRN
  Administered 2021-04-16 – 2021-04-17 (×2): 500 mg via ORAL
  Filled 2021-04-16 (×2): qty 1

## 2021-04-16 NOTE — Progress Notes (Signed)
Mobility Specialist: Progress Note   04/16/21 1433  Mobility  Activity Dangled on edge of bed  Level of Assistance Moderate assist, patient does 50-74%  Assistive Device None  Mobility Sit up in bed/chair position for meals  Mobility Response Tolerated fair  Mobility performed by Mobility specialist;Other (comment) (PTA Carly)  $Mobility charge 1 Mobility   Pre-Mobility: 87 HR, 141/77 BP, 99% SpO2 Post-Mobility: 93 HR, 148/75 BP, 98% SpO2  Pt c/o back and LLE pain he rated 9/10 upon entering room. Pt agreeable to sit EOB though. Pt required minA for bed mobility and modA to sit up on EOB. Pt able to balance independently with verbal cues for hand placement. Pt able to scoot his hips towards HOB with minA using chuck pad and physical assist with LE placement. Pt is back in the bed with call bell at his side and bed alarm on. Pt is set-up to eat lunch.   [facility] Cheynne Virden Mobility Specialist Mobility Specialist Phone: [phone]

## 2021-04-16 NOTE — TOC Progression Note (Signed)
Transition of Care [facility]) - Progression Note    Patient Details  Name: Ryan [name] MRN: [id number] Date of Birth: 1945/12/03  Transition of Care [facility]) CM/SW [place] Phone Number: 04/16/2021, 5:03 PM  Clinical Narrative:     CSW met with patient at bedside. CSW introduced self and explained role. Patient confirmed he was previously at [facility].  Patient confirmed he is agreeable to additional rehab. CSW advised he will be in co-pay status. Patient requested CSW contact his daughter,[name]. CSW spoke with [name] and updated on co-pay status. CSW explained the SNF process. She acknowledges patient really needs rehab before retuning. She requested to discuss with family and call CSW back.   CSW gave bed offer- she accepted bed offer with [facility].  CSW informed Kathy/[facility] and she confirmed availability- she will follow up with CSW regarding co-pay arrangements.   TOC will continue to follow and assist with discharge planning.  Thurmond Butts, MSW, LCSW Clinical Social Worker    Expected Discharge Plan: Home w Home Health Services Barriers to Discharge: Other (must enter comment), Continued Medical Work up (Awaiting Wound Vac approval and  homehealth pending)  Expected Discharge Plan and Services Expected Discharge Plan: [place] In-house Referral: [facility] Discharge Planning Services: CM Consult Post Acute Care Choice: [place] arrangements for the past 2 months: [place]                 DME Arranged: Negative pressure wound device, Vac ([place]) DME Agency: Other - Comment, [employer] Date DME Agency Contacted: 04/15/21 Time DME Agency Contacted: 1200 Representative spoke with at DME Agency: Olivia Mackie HH Arranged: RN, PT, OT [facility] Agency: [place] Date [place]: 04/15/21 Time [place]: 83 Representative spoke with at [place]: [place] ([place]) Interventions    Readmission Risk Interventions Readmission Risk Prevention Plan 04/15/2021  Home Care Screening Complete  Medication Review (RN CM) Complete  Some recent data might be hidden

## 2021-04-16 NOTE — Progress Notes (Signed)
[facility] PROGRESS NOTE    Progress Note  Ryan [name]  [id number] DOB: [birth year] DOA: 04/08/2021 PCP: [name], MD     Brief Narrative:   Ryan [name] is an 75 y.o. 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  Scheduled to have debridement on 04/08/2021 but due to an INR of 3.2 surgery was postponed.  He eventually underwent partial amputation with excisional debridement and wound VAC placement on 04/09/2021, left lower extremity angiogram in 04/13/2021   Assessment/Plan:   Right critical lower limb ischemia ([place]) Admit on 04/08/2021 underwent right ffirst and second toe amputation with right heel excisional debridement with wound VAC placement on 04/09/2021 he was also started empirically on antibiotics on admission.   Surgery relates no good options for revascularization recommended local wound of the left lower extremity we will continue foot salvage attempt with the right lower extremity, nurse to change wound VAC tomorrow. He also recommended to keep.  Skin replacement for 2 weeks postprocedure (04/23/2021) follow-up with them as an outpatient. He will be discharged with wound VAC.  Physician to exchange wound VAC on next visit.  New left thigh pain: Straight leg raise test positive on the left get a lumbar x-ray.  Chronic systolic heart failure (HCC) We will go ahead and restart his home dose of Lasix.  Permanent atrial fibrillation/Status post mitral valve replacement with bioprosthetic valve: Bioprosthetic,  Cont IV heparin and started on Coumadin overlap goal INR 2-3.  Diabetes mellitus type 2: With an A1c of 5.4 continue sliding scale insulin, will control blood  glucose.  Essential HTN: Discontinue midodrine, continue amiodarone blood pressure is elevated this morning. We will continue to monitor closely.  Blood pressure currently be elevated due to pain.  Hyperlipidemia: Continue statins.  Stage 3 chronic kidney disease (HCC) Stable.  DVT prophylaxis: IV heparin with Coumadin bridging. Family Communication: None Status is: Inpatient  Remains inpatient appropriate because:Hemodynamically unstable  Dispo: The patient is from: Home              Anticipated d/c is to: Home              Patient currently is not medically stable to d/c.   Difficult to place patient No    Code Status:     Code Status Orders  (From admission, onward)           Start     Ordered   04/08/21 1211  Full code  Continuous        04/08/21 1212           Code Status History     Date Active Date Inactive Code Status Order ID Comments User Context   03/06/2021 1816 03/13/2021 0325 Full Code [id number]  Ortencia Kick Inpatient   02/25/2021 1202 02/25/2021 2000 Full Code [id number]  Cherre Robins, MD Inpatient   01/30/2021 1513 01/30/2021 2320 Full Code [id number]  Larey Dresser, MD Inpatient   08/26/2020 1818 09/06/2020 2203 Full Code [id number]  Ledora Bottcher, [place] Inpatient   06/11/2016 1627 06/12/2016 1608 Full Code [id number]  Constance Haw, MD Inpatient   08/30/2015 0114 08/30/2015 1540 Full Code [id number]  Edwin Dada, MD Inpatient   09/29/2012 1803 10/06/2012 2104 Full Code [id number]  Cephus Richer, RN Inpatient   09/26/2012 1331 09/29/2012 1803 Full Code [id number]  Lenox Ahr, RN Inpatient         IV Access:   Peripheral IV   Procedures and diagnostic studies:   No results found.   Medical Consultants:   None.   Subjective:    Ryan [name] complaining of left thigh pain  Objective:    Vitals:   04/15/21 1939 04/15/21 2339 04/16/21 0428 04/16/21 0800  BP: 121/75 130/61 (!) 149/73 (!)  157/77  Pulse:  78 84 81  Resp: '18 18 17 18  '$ Temp: 98.2 F (36.8 C) 99 F (37.2 C) 98 F (36.7 C) 97.7 F (36.5 C)  TempSrc: Oral Oral Oral Oral  SpO2: 96% 99% 98% 98%  Weight:   81.7 kg   Height:       SpO2: 98 % O2 Flow Rate (L/min): 2 L/min   Intake/Output Summary (Last 24 hours) at 04/16/2021 1011 Last data filed at 04/16/2021 [id number] Gross per 24 hour  Intake --  Output 520 ml  Net -520 ml    Filed Weights   04/13/21 0500 04/14/21 0315 04/16/21 0428  Weight: 75.4 kg 80.1 kg 81.7 kg    Exam: General exam: In no acute distress. Respiratory system: Good air movement and clear to auscultation. Cardiovascular system: S1 & S2 heard, RRR. No JVD. Gastrointestinal system: Abdomen is nondistended, soft and nontender.  Central nervous system: Straight raise leg test positive on the left Extremities: No pedal edema. Skin: No rashes, lesions or ulcers Psychiatry: Judgement and insight appear normal. Mood & affect appropriate.   Data Reviewed:    Labs: Basic Metabolic Panel: Recent Labs  Lab 04/10/21 0356 04/11/21 0451 04/13/21 1113 04/14/21 0216  NA 138 139 137 137  K 4.3 4.3 3.9 4.2  CL 109 110 105 106  CO2 21* 21* 24 22  GLUCOSE 114* 120* 104* 88  BUN 19 27* 14 18  CREATININE 1.49* 1.46* 1.23 1.14  CALCIUM 8.5* 8.5* 8.8* 9.0    GFR Estimated Creatinine Clearance: 59.6 mL/min (by C-G formula based on SCr of 1.14 mg/dL). Liver Function Tests: No results for input(s): AST, ALT, ALKPHOS, BILITOT, PROT, ALBUMIN in the last 168 hours. No results for input(s): LIPASE, AMYLASE in the last 168 hours. No results for input(s): AMMONIA in the last 168 hours. Coagulation profile Recent Labs  Lab 04/13/21 1113 04/15/21 0940  INR 1.5* 1.4*    COVID-19 Labs  No results for input(s): DDIMER, FERRITIN, LDH, CRP in the last 72 hours.  Lab Results  Component Value Date   SARSCOV2NAA NEGATIVE 04/08/2021   [place] NEGATIVE 03/03/2021   [place] NEGATIVE  01/28/2021   [place] NEGATIVE 08/26/2020    CBC: Recent Labs  Lab 04/12/21 0133 04/13/21 0133 04/14/21 0216 04/15/21 0353 04/16/21 0100  WBC 8.0 8.5 8.3 8.2 8.3  HGB 9.0* 8.9* 10.7* 9.3* 8.9*  HCT 30.8* 30.2* 35.2* 31.1* 28.9*  MCV 81.9 79.5* 79.6* 79.3* 79.4*  PLT 188 164 157 161 193    Cardiac Enzymes: No results for input(s): CKTOTAL, CKMB, CKMBINDEX, TROPONINI in the last 168 hours. BNP (last 3 results) Recent Labs    08/25/20 1121 03/27/21 1123  PROBNP 21,395* 3,146*    CBG: Recent Labs  Lab 04/15/21 0601 04/15/21 1220 04/15/21 1606 04/15/21 2121 04/16/21 0625  GLUCAP 95 108* 131* 130* 101*    D-Dimer: No results for input(s): DDIMER in the last 72 hours. Hgb A1c: No results for input(s): HGBA1C  in the last 72 hours. Lipid Profile: No results for input(s): CHOL, HDL, LDLCALC, TRIG, CHOLHDL, LDLDIRECT in the last 72 hours. Thyroid function studies: No results for input(s): TSH, T4TOTAL, T3FREE, THYROIDAB in the last 72 hours.  Invalid input(s): FREET3 Anemia work up: No results for input(s): VITAMINB12, FOLATE, FERRITIN, TIBC, IRON, RETICCTPCT in the last 72 hours. Sepsis Labs: Recent Labs  Lab 04/13/21 0133 04/14/21 0216 04/15/21 0353 04/16/21 0100  WBC 8.5 8.3 8.2 8.3    Microbiology Recent Results (from the past 240 hour(s))  SARS Coronavirus 2 by RT PCR (hospital order, performed in [facility] hospital lab) Nasopharyngeal Nasopharyngeal Swab     Status: None   Collection Time: 04/08/21  8:16 AM   Specimen: Nasopharyngeal Swab  Result Value Ref Range Status   SARS Coronavirus 2 NEGATIVE NEGATIVE Final    Comment: Performed at [facility], [place] [address] [place], [place] [address]  Surgical pcr screen     Status: None   Collection Time: 04/08/21  9:15 AM   Specimen: Nasal Mucosa; Nasal Swab  Result Value Ref Range Status   MRSA, PCR NEGATIVE NEGATIVE Final   Staphylococcus aureus NEGATIVE NEGATIVE Final    Comment:  (NOTE) The Xpert SA Assay (FDA approved for NASAL specimens in patients 66 years of age and older), is one component of a comprehensive surveillance program. It is not intended to diagnose infection nor to guide or monitor treatment. Performed at [facility], [place] [address], [place], [place] [address]   Blood Cultures x 2 sites     Status: None   Collection Time: 04/08/21  6:16 PM   Specimen: BLOOD LEFT WRIST  Result Value Ref Range Status   Specimen Description BLOOD LEFT WRIST  Final   Special Requests   Final    BOTTLES DRAWN AEROBIC ONLY Blood Culture adequate volume   Culture   Final    NO GROWTH 5 DAYS Performed at [facility], [place] [address] [place], [place] [address]    Report Status 04/13/2021 FINAL  Final  Blood Cultures x 2 sites     Status: None   Collection Time: 04/08/21  6:24 PM   Specimen: BLOOD RIGHT WRIST  Result Value Ref Range Status   Specimen Description BLOOD RIGHT WRIST  Final   Special Requests   Final    BOTTLES DRAWN AEROBIC ONLY Blood Culture adequate volume   Culture   Final    NO GROWTH 5 DAYS Performed at [facility], [place] [address] [place], [place] [address]    Report Status 04/13/2021 FINAL  Final     Medications:    amiodarone  200 mg Oral Daily   atorvastatin  40 mg Oral q1800   docusate sodium  100 mg Oral BID   furosemide  20 mg Oral Daily   gabapentin  300 mg Oral QHS   insulin aspart  0-15 Units Subcutaneous TID WC   multivitamin with minerals  1 tablet Oral Daily   nutrition supplement (JUVEN)  1 packet Oral BID BM   pantoprazole  40 mg Oral Daily   Ensure Max Protein  11 oz Oral QHS   sodium chloride flush  3 mL Intravenous Q12H   warfarin  7.5 mg Oral ONCE-1600   Warfarin - Pharmacist Dosing Inpatient   Does not apply q1600   Continuous Infusions:  sodium chloride     heparin 1,800 Units/hr (04/15/21 1846)      LOS: 8  days   Charlynne Cousins  [facility]  04/16/2021, 10:11 AM

## 2021-04-16 NOTE — Progress Notes (Signed)
Physical Therapy Treatment Patient Details Name: Ryan [name] MRN: [id number] DOB: 1945/12/02 Today's Date: 04/16/2021    History of Present Illness 75 y.o. 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 Scheduled to have debridement on 04/08/2021 but due to an INR of 3.2 surgery was postponed. He eventually underwent partial amputation with excisional debridement and wound VAC placement on 04/09/2021, left lower extremity angiogram in 04/13/2021. 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    PT Comments    Pt received in supine, agreeable to therapy session and with good participation and fair tolerance for bed mobility. Pt limited due to nausea with transition to EOB and c/o severe L thigh and mid-back pain throughout session. Pt remains unable to maintain RLE NWB without manual assist while performing seated scooting along EOB, needs mod/maxA +2 for safety with lateral seated scooting. Unable to progress OOB to chair this date due to pain, will plan to initiate slide board transfer training next session. Pt continues to benefit from PT services to progress toward functional mobility goals. RN/MD notified pt requesting k-pad heating pack for L thigh cramping/pain.   Follow Up Recommendations  SNF     Equipment Recommendations  Other (comment) (TBD next venue)    Recommendations for Other Services       Precautions / Restrictions Precautions Precautions: Fall Precaution Comments: chronic back pain, back precs for comfort Restrictions Weight Bearing Restrictions: Yes RLE Weight Bearing: Non weight bearing    Mobility  Bed Mobility Overal bed mobility: Needs Assistance Bed Mobility: Rolling;Sidelying to Sit;Sit to Sidelying Rolling: Min assist;+2 for physical assistance Sidelying to sit: Mod assist;+2 for  safety/equipment     Sit to sidelying: Mod assist General bed mobility comments: increased time/effort due to increased L thigh/lower back pain; log roll for back comfort    Transfers Overall transfer level: Needs assistance   Transfers: Lateral/Scoot Transfers          Lateral/Scoot Transfers: Max assist;+2 physical assistance General transfer comment: pt too pain limited in LLE to attempt standing this date, of note pt was also noncompliant with NWB status previous session so defer for safety. Pt needs maxA for RLE elevation when attempting seated scoot to maintain compliance with NWB on R, needs transfer pad assist and max cues to sequence but able to use BUE to lift hips off bed briefly a few reps; too pain limited to attempt scooting to chair this date, will plan to attempt slide board transfer to drop arm chair next session.      Balance Overall balance assessment: Needs assistance Sitting-balance support: Feet supported Sitting balance-Leahy Scale: Fair Sitting balance - Comments: able to sit unsupported briefly but preferring 2 UE support due to LE discomfort; has difficulty leaning to the side unsupported to reposition due to pain       Standing balance comment: defer due to pt increased pain in LLE and NWB RLE noncompliance             Cognition Arousal/Alertness: Awake/alert Behavior During Therapy: WFL for tasks assessed/performed Overall Cognitive Status: Within Functional Limits for tasks assessed                General Comments: internally distracted due to severe pain, some slow processing, participatory as able      Exercises Other Exercises Other Exercises: supine BLE AROM: quad sets, glute sets  x5 reps ea (handout given, pt will need reinforcement as he was distracted by pain)    General Comments General comments (skin integrity, edema, etc.): BP 154/96 (114) supine prior to session, BP 141/77 (97) seated EOB (pt c/o nausea for a few minutes  upon sitting up). Able to tolerate sitting EOB ~8 mins.      Pertinent Vitals/Pain Pain Assessment: 0-10 Pain Score: 9  Pain Location: L thigh, mid-back > RLE Pain Descriptors / Indicators: Aching;Discomfort;Grimacing;Guarding;Sore;Moaning;Cramping Pain Intervention(s): Limited activity within patient's tolerance;Monitored during session;Repositioned;Other (comment) (MD/RN notified pt requesting K-pad for L thigh cramping/pain, MD verbally approved)     PT Goals (current goals can now be found in the care plan section) Acute Rehab PT Goals Patient Stated Goal: reduced leg and back pain PT Goal Formulation: With patient/family Time For Goal Achievement: 04/29/21 Potential to Achieve Goals: Fair Progress towards PT goals: Progressing toward goals    Frequency    Min 2X/week      PT Plan Current plan remains appropriate       AM-PAC PT "6 Clicks" Mobility   Outcome Measure  Help needed turning from your back to your side while in a flat bed without using bedrails?: A Lot Help needed moving from lying on your back to sitting on the side of a flat bed without using bedrails?: A Lot Help needed moving to and from a bed to a chair (including a wheelchair)?: A Lot Help needed standing up from a chair using your arms (e.g., wheelchair or bedside chair)?: Total Help needed to walk in hospital room?: Total Help needed climbing 3-5 steps with a railing? : Total 6 Click Score: 9    End of Session   Activity Tolerance: Patient limited by pain;Other (comment) (nausea with EOB transition) Patient left: in bed;with call bell/phone within reach;with bed alarm set (bed up in chair position to eat lunch, B heels floated) Nurse Communication: Mobility status PT Visit Diagnosis: Unsteadiness on feet (R26.81);Other abnormalities of gait and mobility (R26.89);Muscle weakness (generalized) (M62.81);Difficulty in walking, not elsewhere classified (R26.2);Pain Pain - Right/Left: Right Pain -  part of body: Ankle and joints of foot;Leg (back, L thigh, R foot)     Time: [id number] PT Time Calculation (min) (ACUTE ONLY): 25 min  Charges:  $Therapeutic Activity: 23-37 mins                     Agusta Hackenberg P., PTA Acute Rehabilitation Services Pager: [phone] Office: [place] 04/16/2021, 3:36 PM

## 2021-04-16 NOTE — Progress Notes (Signed)
ANTICOAGULATION CONSULT - Follow Up Consult  Pharmacy Consult for IV Heparin / Warfarin Indication: atrial fibrillation  Total Body Weight: 80.1 kg Height: 72 inches Heparin Dosing Weight: 80.1 kg  Labs: Recent Labs     0000 04/13/21 1113 04/14/21 0216 04/14/21 1011 04/14/21 1851 04/15/21 0353 04/15/21 0940 04/16/21 0100  HGB   < >  --  10.7*  --   --  9.3*  --  8.9*  HCT  --   --  35.2*  --   --  31.1*  --  28.9*  PLT  --   --  157  --   --  161  --  193  LABPROT  --  17.7*  --   --   --   --  16.6*  --   INR  --  1.5*  --   --   --   --  1.4*  --   HEPARINUNFRC  --   --  0.20*   < > 0.34 0.61  --  0.52  CREATININE  --  1.23 1.14  --   --   --   --   --    < > = values in this interval not displayed.   Assessment: 75 yr old man male with critical limb ischemia to start heparin bridge for atrial fibrillation while INR subtherapeutic while awaiting surgery. Patient was on PTA warfarin requiring reversal with FFP on 7/20 before foot debridement and toe amputation on 7/21.  Pt is S/P angiogram by Dr. Donzetta Matters 7/25.   No further interventions planned per vascular - warfarin restarted 7/28 PTA regimen is warfarin 7.5 mg daily.  Goal of Therapy:  Heparin level 0.3-0.7 units/ml Monitor platelets by anticoagulation protocol: Yes   Plan:  Continue heparin infusion at 1800 units/hr Repeat Warfarin 7.5 mg tonight Monitor daily HL, INR, and for s/sx of bleeding   Thank you Anette Guarneri, PharmD 04/16/2021 8:12 AM  Please check AMION for all [place] phone numbers After 10:00 PM, call [place] [phone]

## 2021-04-16 NOTE — Progress Notes (Addendum)
[facility] [place]  Subjective  - Pain in the left hip down to the knee.  He states it hurts so much he can't move his left leg independently.  He states he has a history of spine issues and has injections in the past.   Objective (!) 149/73 84 98 F (36.7 C) (Oral) 17 98%  Intake/Output Summary (Last 24 hours) at 04/16/2021 0720 Last data filed at 04/16/2021 3846 Gross per 24 hour  Intake --  Output 995 ml  Net -995 ml   Doppler PT right LE, no change in dusky/dark appearence to GT amp site Heel wound vac in place with good suction, very minimal OP Left peroneal signal Lungs non labored breathing   Assessment/Planning: 75 y.o. male is s/p LLE arteriogram 1) right first toe partial toe amputation 2) right second toe amputation 3) right heel sharp excisional debridement Wound vac placed to heel wound  His left hip to knee pain may be spine involved?  He has doppler signals and the left GT toe tip is stable.  I do not think this is related to his vascular issues.   Stable in flow with no revascularization options. Plan for vac change tomorrow  Roxy Horseman 04/16/2021 7:20 AM --  Laboratory Lab Results: Recent Labs    04/15/21 0353 04/16/21 0100  WBC 8.2 8.3  HGB 9.3* 8.9*  HCT 31.1* 28.9*  PLT 161 193   BMET Recent Labs    04/13/21 1113 04/14/21 0216  NA 137 137  K 3.9 4.2  CL 105 106  CO2 24 22  GLUCOSE 104* 88  BUN 14 18  CREATININE 1.23 1.14  CALCIUM 8.8* 9.0    COAG Lab Results  Component Value Date   INR 1.4 (H) 04/15/2021   INR 1.5 (H) 04/13/2021   INR 1.8 (H) 04/09/2021   No results found for: PTT  VASCULAR STAFF ADDENDUM: I agree with the above.  No good options for revascularization going forward. Recommend local wound care only to the left lower extremity. Will continue foot salvage attempts with right lower extremity. Will ask wound nurse to start changing VAC tomorrow. Keep Myriad skin  replacement on until two weeks post procedure (04/23/21). Follow up with me or PA at that time.   [name]. [name], MD [facility] Phone Number: [phone] 04/16/2021 8:59 AM

## 2021-04-16 NOTE — Consult Note (Signed)
[place] Nurse Consult Note:MD ordered NPWT (VAC) dressing change tto right heel omorrow.  04/17/21.  I will order supplies.  Orders to leave Xeroform and Myriad dressings in place.  Reason for Consult: WOC to change VAC dressing 04/17/21 and Mon/WEd/Fri while inpatient.  Will likely discharge to rehab. Wound type: Right first and second toe amputation with right heel excisional debridement and application of Myriad skin substitute Pressure Injury POA: NA Measurement:assessed at [facility] change tomorrow Wound XIH:WTUUE great toe amputation site is dark.  Drainage (amount, consistency, odor) minimal serosanguinous in canister.  Periwound: amputation sites to right first and second toes. Recent revascularization of this limb with angiogram Dressing procedure/placement/frequency: Mon/Wed/Fri Cleanse right heel wound with NS and pat dry.  Keep Xeroform and myriad skin substitute in wound bed.  Apply black foam over Xeroform   will likely need barrier ring to promote seal.   Cover with drape, bridge to dorsal foot.  Will follow.  Domenic Moras MSN, RN, FNP-BC CWON Wound, Ostomy, Continence Nurse Pager [phone]

## 2021-04-17 DIAGNOSIS — I70229 Atherosclerosis of native arteries of extremities with rest pain, unspecified extremity: Secondary | ICD-10-CM | POA: Diagnosis not present

## 2021-04-17 LAB — HEPARIN LEVEL (UNFRACTIONATED): Heparin Unfractionated: 0.34 IU/mL (ref 0.30–0.70)

## 2021-04-17 LAB — CBC
HCT: 26.6 % — ABNORMAL LOW (ref 39.0–52.0)
Hemoglobin: 7.9 g/dL — ABNORMAL LOW (ref 13.0–17.0)
MCH: 23.8 pg — ABNORMAL LOW (ref 26.0–34.0)
MCHC: 29.7 g/dL — ABNORMAL LOW (ref 30.0–36.0)
MCV: 80.1 fL (ref 80.0–100.0)
Platelets: 280 10*3/uL (ref 150–400)
RBC: 3.32 MIL/uL — ABNORMAL LOW (ref 4.22–5.81)
RDW: 18.3 % — ABNORMAL HIGH (ref 11.5–15.5)
WBC: 9.7 10*3/uL (ref 4.0–10.5)
nRBC: 0 % (ref 0.0–0.2)

## 2021-04-17 LAB — GLUCOSE, CAPILLARY
Glucose-Capillary: 146 mg/dL — ABNORMAL HIGH (ref 70–99)
Glucose-Capillary: 147 mg/dL — ABNORMAL HIGH (ref 70–99)
Glucose-Capillary: 126 mg/dL — ABNORMAL HIGH (ref 70–99)
Glucose-Capillary: 127 mg/dL — ABNORMAL HIGH (ref 70–99)

## 2021-04-17 LAB — PROTIME-INR
INR: 1.5 — ABNORMAL HIGH (ref 0.8–1.2)
Prothrombin Time: 17.9 seconds — ABNORMAL HIGH (ref 11.4–15.2)

## 2021-04-17 MED ORDER — ENOXAPARIN SODIUM 80 MG/0.8ML IJ SOSY
75.0000 mg | PREFILLED_SYRINGE | Freq: Two times a day (BID) | INTRAMUSCULAR | Status: DC
  Administered 2021-04-17 (×2): 75 mg via SUBCUTANEOUS
  Filled 2021-04-17 (×2): qty 0.8

## 2021-04-17 MED ORDER — WARFARIN SODIUM 7.5 MG PO TABS
7.5000 mg | ORAL_TABLET | Freq: Once | ORAL | Status: AC
  Administered 2021-04-17: 7.5 mg via ORAL
  Filled 2021-04-17: qty 1

## 2021-04-17 MED ORDER — MIDODRINE HCL 5 MG PO TABS
10.0000 mg | ORAL_TABLET | Freq: Three times a day (TID) | ORAL | Status: DC
  Administered 2021-04-17 – 2021-05-22 (×85): 10 mg via ORAL
  Filled 2021-04-17 (×88): qty 2

## 2021-04-17 MED ORDER — ALUM & MAG HYDROXIDE-SIMETH 200-200-20 MG/5ML PO SUSP
30.0000 mL | ORAL | Status: DC | PRN
  Administered 2021-04-17: 30 mL via ORAL
  Filled 2021-04-17: qty 30

## 2021-04-17 NOTE — Progress Notes (Signed)
Pt started complaining tonight about developing a sudden burning pain in his lower abdomen.  Bowel sounds were hypoactive.  Abdomen was soft and no distention.  Thinking for possible indigestion, ordered Maalox 30 mL every 4 hours as needed and gave to the patient.  The patient was able to go to sleep.  When he woke up several hours later he said his abdomen still didn't feel right but it was less than before.  Patient went back to sleep without asking for anything. Informed the on call physician, who suggests to monitor this for now.  Will continue to monitor.  Lupita Dawn, RN

## 2021-04-17 NOTE — Progress Notes (Signed)
  Progress Note    04/17/2021 7:51 AM 4 Days Post-Op  Subjective:  sleepy   Vitals:   04/17/21 0331 04/17/21 0735  BP: (!) 146/50 (!) 95/59  Pulse: 92 90  Resp: 18 17  Temp: 98.3 F (36.8 C) 97.8 F (36.6 C)  SpO2: 97% 97%   Physical Exam: Cardiac:  regular Lungs:  non labored Extremities:  Right PT doppler signal, right great toe ischemic changes at amp site. Unchanged. Dressings changed. Right foot heel wound VAC in place, minimal output. Left foot dressed.  Neurologic: sleepy but will wake to answer questions  CBC    Component Value Date/Time   WBC 9.7 04/17/2021 0115   RBC 3.32 (L) 04/17/2021 0115   HGB 7.9 (L) 04/17/2021 0115   HGB 9.6 (L) 03/27/2021 1123   HCT 26.6 (L) 04/17/2021 0115   HCT 30.4 (L) 03/27/2021 1123   PLT 280 04/17/2021 0115   PLT 362 03/27/2021 1123   MCV 80.1 04/17/2021 0115   MCV 77 (L) 03/27/2021 1123   MCH 23.8 (L) 04/17/2021 0115   MCHC 29.7 (L) 04/17/2021 0115   RDW 18.3 (H) 04/17/2021 0115   RDW 17.2 (H) 03/27/2021 1123   LYMPHSABS 1,363 06/01/2016 1414   MONOABS 517 06/01/2016 1414   EOSABS 141 06/01/2016 1414   BASOSABS 47 06/01/2016 1414    BMET    Component Value Date/Time   NA 137 04/14/2021 0216   NA 142 03/27/2021 1123   K 4.2 04/14/2021 0216   CL 106 04/14/2021 0216   CO2 22 04/14/2021 0216   GLUCOSE 88 04/14/2021 0216   BUN 18 04/14/2021 0216   BUN 17 03/27/2021 1123   CREATININE 1.14 04/14/2021 0216   CREATININE 1.17 06/01/2016 1414   CALCIUM 9.0 04/14/2021 0216   GFRNONAA >60 04/14/2021 0216   GFRAA 39 (L) 10/01/2020 1157    INR    Component Value Date/Time   INR 1.5 (H) 04/17/2021 0115     Intake/Output Summary (Last 24 hours) at 04/17/2021 0751 Last data filed at 04/17/2021 0401 Gross per 24 hour  Intake 1609.96 ml  Output 350 ml  Net 1259.96 ml     Assessment/Plan:  75 y.o. male is s/p  1) right first toe partial toe amputation 2) right second toe amputation 3) right heel sharp excisional  debridement Wound vac placed to heel wound 4 Days Post-Op   Patient very sleepy this morning. Not complaining of any pain Hgb 7.9 this morning VAC change by wound Care RN Right 1st toe stable ischemic changes Continue local wound care Left foot Plan was for [facility] RN [facility] changes now to SNF [facility] Has follow up arranged in our office on 8/9   Karoline Caldwell, [place] [facility] [phone] 04/17/2021 7:51 AM

## 2021-04-17 NOTE — Consult Note (Signed)
WOC Nurse Consult Note: Reason for Consult First NPWT dressing change to right heel. Orders to keep Myriad skin substitute and xeroform gauze intact, place foam over xeroform.  Wound type: Full thickness,  Pressure Injury POA: Yes/No/NA Measurement: Unable to measure wound with myriad and xeroform covering. Wound bed: N/A.  Non removable dressing in place. Approximately 4cm x 6cm. Drainage (amount, consistency, odor) Small amount serosanguinous in cannister. Periwound: intact Dressing procedure/placement/frequency: NPWT dressing removed after patient received premedication. Xeroform gauze over Myriad skin substitute (which is stapled in place) maintained.  Two skin barrier rings used to encircle dressing.  Two pieces of black foam used to cover dressing Drape applied to cover dressing and extended up to ankle as a barrier between bridge and skin. Bridge placed over drape and then covered with drape.  Dressing attached to 113mHg continuous negative pressure and an immediate seal is achieved.  Patient tolerated procedure well.  I was assisted today in the dressing change by the patient's Bedside RN, L. TElonda Husky Next dressing change is due on Monday, 04/20/21.  VAC dressing kit in room along with 2 skin barrier rings.  [facility] team will follow, and will remain available to this patient, the nursing and medical teams.   Thanks, LMaudie Flakes MSN, RN, GPulpotio Bareas CArther Abbott Pager# [phone]

## 2021-04-17 NOTE — Progress Notes (Addendum)
ANTICOAGULATION CONSULT - Follow Up Consult  Pharmacy Consult for IV Heparin / Warfarin Indication: atrial fibrillation  Total Body Weight: 80.1 kg Height: 72 inches Heparin Dosing Weight: 80.1 kg  Labs: Recent Labs    04/15/21 0353 04/15/21 0940 04/16/21 0100 04/17/21 0115  HGB 9.3*  --  8.9* 7.9*  HCT 31.1*  --  28.9* 26.6*  PLT 161  --  193 280  LABPROT  --  16.6*  --  17.9*  INR  --  1.4*  --  1.5*  HEPARINUNFRC 0.61  --  0.52 0.34   Assessment: 75 yr old man male with critical limb ischemia to start heparin bridge for atrial fibrillation while INR subtherapeutic while awaiting surgery. Patient was on PTA warfarin requiring reversal with FFP on 7/20 before foot debridement and toe amputation on 7/21.  Pt is S/P angiogram by Dr. Donzetta Matters 7/25.   No further interventions planned per vascular - warfarin restarted 7/28. Heparin level is therapeutic at 0.34, on 1800 units/hr. INR is 1.5 - anticipate seeing full warfarin effects tomorrow. No s/sx of bleeding documented.   PTA regimen is warfarin 7.5 mg daily.  Goal of Therapy:  Heparin level 0.3-0.7 units/ml Monitor platelets by anticoagulation protocol: Yes   Plan:  Continue heparin infusion at 1800 units/hr Warfarin 7.5 mg tonight Monitor daily HL, INR, and for s/sx of bleeding   Antonietta Jewel, PharmD, [place] Pharmacist  Phone: [phone] 04/17/2021 7:08 AM  Please check AMION for all [place] phone numbers After 10:00 PM, call [place] for SNF - will change from heparin infusion to enoxaparin. Will stop heparin infusion and given enoxaparin 75 mg (1 mg/kg) every 12 hours while on warfarin until INR is therapeutic. If plan for discharge would send out on warfarin 7.5 mg daily with INR check on Monday (at minimum) at [facility].  Antonietta Jewel, PharmD, [place] Clinical Pharmacist

## 2021-04-17 NOTE — Progress Notes (Addendum)
[facility] PROGRESS NOTE    Progress Note  Ryan [name]  ZDG:[id number] DOB: 1946-02-16 DOA: 04/08/2021 PCP: [name], MD     Brief Narrative:   Ryan [name] is an 75 y.o. 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  Scheduled to have debridement on 04/08/2021 but due to an INR of 3.2 surgery was postponed.  He eventually underwent partial amputation with excisional debridement and wound VAC placement on 04/09/2021, left lower extremity angiogram in 04/13/2021   Assessment/Plan:   Right critical lower limb ischemia ([place]) Admit on 04/08/2021 underwent right first and second toe amputation with right heel excisional debridement with wound VAC placement on 04/09/2021 his last day of antibiotics was 04/12/2021 Surgery relates to continue dressing changes and wound care.  Wound VAC to be changed by wound care nurse. He also recommended to keep in placement for 2 weeks postprocedure ([id number]) follow-up with them as an outpatient. He will be discharged with wound VAC to skilled nursing facility.  New left thigh pain: X-ray of the spine showed an indeterminate wedge fracture of L5 and T1 and moderate multilevel degenerative changes. Pain is not controlled, get an MRI of the lumbar spine.  Chronic systolic heart failure (HCC) Continue oral Lasix.  Resume midodrine. Has a follow-up appointment with the heart-heart failure team to rule out multiple myeloma question amyloid as an outpatient.  Permanent atrial fibrillation/Status post mitral valve replacement with bioprosthetic valve: Bioprosthetic,  Transition IV heparin to Lovenox for goal INR 2-3.  Diabetes mellitus type 2: With an A1c of 5.4  continue sliding scale insulin, will control blood glucose.  Essential HTN: Discontinue midodrine, continue amiodarone blood pressure is elevated this morning. We will continue to monitor closely.  Blood pressure currently be elevated due to pain.  Hyperlipidemia: Continue statins.  Stage 3 chronic kidney disease (HCC) Stable.  DVT prophylaxis: IV heparin with Coumadin bridging. Family Communication: None Status is: Inpatient  Remains inpatient appropriate because:Hemodynamically unstable  Dispo: The patient is from: Home              Anticipated d/c is to: Home              Patient currently is not medically stable to d/c.   Difficult to place patient No    Code Status:     Code Status Orders  (From admission, onward)           Start     Ordered   04/08/21 1211  Full code  Continuous        04/08/21 1212           Code Status History     Date Active Date Inactive Code Status Order ID Comments User Context   03/06/2021 1816 03/13/2021 0325 Full Code [id number]  Ortencia Kick Inpatient   02/25/2021 1202 02/25/2021 2000 Full Code [id number]  Cherre Robins, MD Inpatient   01/30/2021 1513 01/30/2021 2320 Full Code [id number]  Larey Dresser, MD Inpatient   08/26/2020 1818 09/06/2020 2203 Full Code [id number]  Ledora Bottcher, [place] Inpatient   06/11/2016 1627 06/12/2016 1608 Full Code [id number]  Constance Haw, MD Inpatient   08/30/2015 0114 08/30/2015 1540 Full Code [id number]  Edwin Dada, MD Inpatient   09/29/2012 [phone]  10/06/2012 2104 Full Code [id number]  Cephus Richer, RN Inpatient   09/26/2012 1331 09/29/2012 1803 Full Code [id number]  Lenox Ahr, RN Inpatient         IV Access:   Peripheral IV   Procedures and diagnostic studies:   DG Lumbar Spine 2-3 Views  Result Date: 04/16/2021 CLINICAL DATA:  Lower back pain EXAM: LUMBAR SPINE - 2-3 VIEW COMPARISON:  None. FINDINGS: There are five non-rib bearing lumbar-type vertebral  bodies. There is normal alignment. There is mild age indeterminate wedging of L5 and T11. Moderate intervertebral disc space height loss of L4-5 and L5-S1. Mild intervertebral disc space height loss of L1-2 and L2-3. Multilevel endplate proliferative changes and facet arthropathy. Endplate sclerosis of L5, likely degenerative. Atherosclerotic calcifications. Diffuse gaseous distension of loops of bowel with air visualized in the rectum. IMPRESSION: 1. Mild age-indeterminate wedging of L5 and T11. Recommend correlation with point tenderness. 2. Moderate multilevel degenerative changes. 3. Atherosclerotic calcifications. Electronically Signed   By: Valentino Saxon MD   On: 04/16/2021 13:20     Medical Consultants:   None.   Subjective:    Ryan [name] relates he continues to have left thigh pain  Objective:    Vitals:   04/16/21 2013 04/16/21 2322 04/17/21 0331 04/17/21 0735  BP: 121/63 121/64 (!) 146/50 (!) 95/59  Pulse: 86 83 92 90  Resp: $Remo'18 18 18 17  'ZkBgE$ Temp: 98.7 F (37.1 C) 98.6 F (37 C) 98.3 F (36.8 C) 97.8 F (36.6 C)  TempSrc: Oral Oral Oral Oral  SpO2: 98% 92% 97% 97%  Weight:   78.3 kg   Height:       SpO2: 97 % O2 Flow Rate (L/min): 2 L/min   Intake/Output Summary (Last 24 hours) at 04/17/2021 0908 Last data filed at 04/17/2021 0401 Gross per 24 hour  Intake 1609.96 ml  Output 350 ml  Net 1259.96 ml    Filed Weights   04/14/21 0315 04/16/21 0428 04/17/21 0331  Weight: 80.1 kg 81.7 kg 78.3 kg    Exam: General exam: In no acute distress. Respiratory system: Good air movement and clear to auscultation. Cardiovascular system: S1 & S2 heard, RRR. No JVD. Gastrointestinal system: Abdomen is nondistended, soft and nontender.  Central nervous system: Straight leg positive on the left Extremities: No pedal edema. Skin: No rashes, lesions or ulcers  Data Reviewed:    Labs: Basic Metabolic Panel: Recent Labs  Lab 04/11/21 0451 04/13/21 1113  04/14/21 0216  NA 139 137 137  K 4.3 3.9 4.2  CL 110 105 106  CO2 21* 24 22  GLUCOSE 120* 104* 88  BUN 27* 14 18  CREATININE 1.46* 1.23 1.14  CALCIUM 8.5* 8.8* 9.0    GFR Estimated Creatinine Clearance: 59.6 mL/min (by C-G formula based on SCr of 1.14 mg/dL). Liver Function Tests: No results for input(s): AST, ALT, ALKPHOS, BILITOT, PROT, ALBUMIN in the last 168 hours. No results for input(s): LIPASE, AMYLASE in the last 168 hours. No results for input(s): AMMONIA in the last 168 hours. Coagulation profile Recent Labs  Lab 04/13/21 1113 04/15/21 0940 04/17/21 0115  INR 1.5* 1.4* 1.5*    COVID-19 Labs  No results for input(s): DDIMER, FERRITIN, LDH, CRP in the last 72 hours.  Lab Results  Component Value Date   SARSCOV2NAA NEGATIVE 04/08/2021   [place] NEGATIVE 03/03/2021   [place] NEGATIVE 01/28/2021   [place] NEGATIVE 08/26/2020    CBC: Recent Labs  Lab 04/13/21 0133 04/14/21 0216 04/15/21 0353  04/16/21 0100 04/17/21 0115  WBC 8.5 8.3 8.2 8.3 9.7  HGB 8.9* 10.7* 9.3* 8.9* 7.9*  HCT 30.2* 35.2* 31.1* 28.9* 26.6*  MCV 79.5* 79.6* 79.3* 79.4* 80.1  PLT 164 157 161 193 280    Cardiac Enzymes: No results for input(s): CKTOTAL, CKMB, CKMBINDEX, TROPONINI in the last 168 hours. BNP (last 3 results) Recent Labs    08/25/20 1121 03/27/21 1123  PROBNP 21,395* 3,146*    CBG: Recent Labs  Lab 04/16/21 0625 04/16/21 1213 04/16/21 1624 04/16/21 2130 04/17/21 0627  GLUCAP 101* 126* 139* 117* 146*    D-Dimer: No results for input(s): DDIMER in the last 72 hours. Hgb A1c: No results for input(s): HGBA1C in the last 72 hours. Lipid Profile: No results for input(s): CHOL, HDL, LDLCALC, TRIG, CHOLHDL, LDLDIRECT in the last 72 hours. Thyroid function studies: No results for input(s): TSH, T4TOTAL, T3FREE, THYROIDAB in the last 72 hours.  Invalid input(s): FREET3 Anemia work up: No results for input(s): VITAMINB12, FOLATE, FERRITIN, TIBC,  IRON, RETICCTPCT in the last 72 hours. Sepsis Labs: Recent Labs  Lab 04/14/21 0216 04/15/21 0353 04/16/21 0100 04/17/21 0115  WBC 8.3 8.2 8.3 9.7    Microbiology Recent Results (from the past 240 hour(s))  SARS Coronavirus 2 by RT PCR (hospital order, performed in [facility] hospital lab) Nasopharyngeal Nasopharyngeal Swab     Status: None   Collection Time: 04/08/21  8:16 AM   Specimen: Nasopharyngeal Swab  Result Value Ref Range Status   SARS Coronavirus 2 NEGATIVE NEGATIVE Final    Comment: Performed at [facility], [address] [place], [place] [address]  Surgical pcr screen     Status: None   Collection Time: 04/08/21  9:15 AM   Specimen: Nasal Mucosa; Nasal Swab  Result Value Ref Range Status   MRSA, PCR NEGATIVE NEGATIVE Final   Staphylococcus aureus NEGATIVE NEGATIVE Final    Comment: (NOTE) The Xpert SA Assay (FDA approved for NASAL specimens in patients 77 years of age and older), is one component of a comprehensive surveillance program. It is not intended to diagnose infection nor to guide or monitor treatment. Performed at [facility], [place] [address] [place], [place] [address]   Blood Cultures x 2 sites     Status: None   Collection Time: 04/08/21  6:16 PM   Specimen: BLOOD LEFT WRIST  Result Value Ref Range Status   Specimen Description BLOOD LEFT WRIST  Final   Special Requests   Final    BOTTLES DRAWN AEROBIC ONLY Blood Culture adequate volume   Culture   Final    NO GROWTH 5 DAYS Performed at [facility], [place] [address] [place], [place] [address]    Report Status 04/13/2021 FINAL  Final  Blood Cultures x 2 sites     Status: None   Collection Time: 04/08/21  6:24 PM   Specimen: BLOOD RIGHT WRIST  Result Value Ref Range Status   Specimen Description BLOOD RIGHT WRIST  Final   Special Requests   Final    BOTTLES DRAWN AEROBIC ONLY Blood Culture adequate volume   Culture   Final    NO GROWTH 5 DAYS Performed at [facility], [place] [address] [place], [place] [address]    Report Status 04/13/2021 FINAL  Final     Medications:    amiodarone  200 mg Oral Daily   atorvastatin  40 mg Oral q1800   docusate sodium  100 mg  Oral BID   furosemide  20 mg Oral Daily   gabapentin  300 mg Oral QHS   insulin aspart  0-15 Units Subcutaneous TID WC   multivitamin with minerals  1 tablet Oral Daily   nutrition supplement (JUVEN)  1 packet Oral BID BM   pantoprazole  40 mg Oral Daily   Ensure Max Protein  11 oz Oral QHS   sodium chloride flush  3 mL Intravenous Q12H   warfarin  7.5 mg Oral ONCE-1600   Warfarin - Pharmacist Dosing Inpatient   Does not apply q1600   Continuous Infusions:  sodium chloride     heparin 1,800 Units/hr (04/17/21 0401)      LOS: 9 days   Charlynne Cousins  [facility]  04/17/2021, 9:08 AM

## 2021-04-18 ENCOUNTER — Inpatient Hospital Stay (HOSPITAL_COMMUNITY): Payer: Medicare HMO

## 2021-04-18 DIAGNOSIS — Z7189 Other specified counseling: Secondary | ICD-10-CM | POA: Diagnosis not present

## 2021-04-18 DIAGNOSIS — Z515 Encounter for palliative care: Secondary | ICD-10-CM | POA: Diagnosis not present

## 2021-04-18 DIAGNOSIS — I70229 Atherosclerosis of native arteries of extremities with rest pain, unspecified extremity: Secondary | ICD-10-CM | POA: Diagnosis not present

## 2021-04-18 LAB — LACTIC ACID, PLASMA: Lactic Acid, Venous: 1.6 mmol/L (ref 0.5–1.9)

## 2021-04-18 LAB — BASIC METABOLIC PANEL
Anion gap: 11 (ref 5–15)
Anion gap: 11 (ref 5–15)
BUN: 52 mg/dL — ABNORMAL HIGH (ref 8–23)
BUN: 51 mg/dL — ABNORMAL HIGH (ref 8–23)
CO2: 22 mmol/L (ref 22–32)
CO2: 22 mmol/L (ref 22–32)
Calcium: 8.4 mg/dL — ABNORMAL LOW (ref 8.9–10.3)
Calcium: 8.1 mg/dL — ABNORMAL LOW (ref 8.9–10.3)
Chloride: 101 mmol/L (ref 98–111)
Chloride: 102 mmol/L (ref 98–111)
Creatinine, Ser: 4.66 mg/dL — ABNORMAL HIGH (ref 0.61–1.24)
Creatinine, Ser: 4.64 mg/dL — ABNORMAL HIGH (ref 0.61–1.24)
GFR, Estimated: 12 mL/min — ABNORMAL LOW (ref >60)
GFR, Estimated: 12 mL/min — ABNORMAL LOW (ref >60)
Glucose, Bld: 143 mg/dL — ABNORMAL HIGH (ref 70–99)
Glucose, Bld: 137 mg/dL — ABNORMAL HIGH (ref 70–99)
Potassium: 6.4 mmol/L (ref 3.5–5.1)
Potassium: 6.1 mmol/L — ABNORMAL HIGH (ref 3.5–5.1)
Sodium: 134 mmol/L — ABNORMAL LOW (ref 135–145)
Sodium: 135 mmol/L (ref 135–145)

## 2021-04-18 LAB — CBC
HCT: 20.8 % — ABNORMAL LOW (ref 39.0–52.0)
HCT: 22.2 % — ABNORMAL LOW (ref 39.0–52.0)
Hemoglobin: 6.4 g/dL — CL (ref 13.0–17.0)
Hemoglobin: 7 g/dL — ABNORMAL LOW (ref 13.0–17.0)
MCH: 24.1 pg — ABNORMAL LOW (ref 26.0–34.0)
MCH: 24.7 pg — ABNORMAL LOW (ref 26.0–34.0)
MCHC: 30.8 g/dL (ref 30.0–36.0)
MCHC: 31.5 g/dL (ref 30.0–36.0)
MCV: 78.2 fL — ABNORMAL LOW (ref 80.0–100.0)
MCV: 78.4 fL — ABNORMAL LOW (ref 80.0–100.0)
Platelets: 300 10*3/uL (ref 150–400)
Platelets: 275 10*3/uL (ref 150–400)
RBC: 2.66 MIL/uL — ABNORMAL LOW (ref 4.22–5.81)
RBC: 2.83 MIL/uL — ABNORMAL LOW (ref 4.22–5.81)
RDW: 19 % — ABNORMAL HIGH (ref 11.5–15.5)
RDW: 17.9 % — ABNORMAL HIGH (ref 11.5–15.5)
WBC: 14.7 10*3/uL — ABNORMAL HIGH (ref 4.0–10.5)
WBC: 19.1 10*3/uL — ABNORMAL HIGH (ref 4.0–10.5)
nRBC: 0 % (ref 0.0–0.2)
nRBC: 0.2 % (ref 0.0–0.2)

## 2021-04-18 LAB — PROTIME-INR
INR: 2.3 — ABNORMAL HIGH (ref 0.8–1.2)
INR: 2 — ABNORMAL HIGH (ref 0.8–1.2)
Prothrombin Time: 25.4 seconds — ABNORMAL HIGH (ref 11.4–15.2)
Prothrombin Time: 22.9 seconds — ABNORMAL HIGH (ref 11.4–15.2)

## 2021-04-18 LAB — PREPARE RBC (CROSSMATCH)

## 2021-04-18 LAB — GLUCOSE, CAPILLARY
Glucose-Capillary: 126 mg/dL — ABNORMAL HIGH (ref 70–99)
Glucose-Capillary: 115 mg/dL — ABNORMAL HIGH (ref 70–99)
Glucose-Capillary: 156 mg/dL — ABNORMAL HIGH (ref 70–99)
Glucose-Capillary: 151 mg/dL — ABNORMAL HIGH (ref 70–99)

## 2021-04-18 LAB — CK: Total CK: 3479 U/L — ABNORMAL HIGH (ref 49–397)

## 2021-04-18 LAB — PHOSPHORUS: Phosphorus: 5.9 mg/dL — ABNORMAL HIGH (ref 2.5–4.6)

## 2021-04-18 MED ORDER — CALCIUM GLUCONATE-NACL 1-0.675 GM/50ML-% IV SOLN
1.0000 g | Freq: Once | INTRAVENOUS | Status: AC
  Administered 2021-04-18: 1000 mg via INTRAVENOUS
  Filled 2021-04-18: qty 50

## 2021-04-18 MED ORDER — SODIUM CHLORIDE 0.9 % IV BOLUS: 1000.0000 mL | Freq: Once | INTRAVENOUS | Status: DC

## 2021-04-18 MED ORDER — SODIUM CHLORIDE 0.9% IV SOLUTION: Freq: Once | INTRAVENOUS | Status: DC

## 2021-04-18 MED ORDER — INSULIN ASPART 100 UNIT/ML IV SOLN
10.0000 [IU] | Freq: Once | INTRAVENOUS | Status: AC
  Administered 2021-04-18: 10 [IU] via INTRAVENOUS

## 2021-04-18 MED ORDER — SODIUM CHLORIDE 0.9 % IV BOLUS
500.0000 mL | Freq: Once | INTRAVENOUS | Status: AC
  Administered 2021-04-18: 500 mL via INTRAVENOUS

## 2021-04-18 MED ORDER — DEXTROSE 50 % IV SOLN
1.0000 | Freq: Once | INTRAVENOUS | Status: AC
  Administered 2021-04-18: 50 mL via INTRAVENOUS
  Filled 2021-04-18: qty 50

## 2021-04-18 MED ORDER — PHYTONADIONE 1 MG/0.5 ML ORAL SOLUTION
2.0000 mg | Freq: Once | ORAL | Status: AC
  Administered 2021-04-18: 2 mg via ORAL
  Filled 2021-04-18: qty 1

## 2021-04-18 MED ORDER — SODIUM CHLORIDE 0.9% IV SOLUTION: Freq: Once | INTRAVENOUS | Status: AC

## 2021-04-18 MED ORDER — SODIUM POLYSTYRENE SULFONATE 15 GM/60ML PO SUSP
30.0000 g | Freq: Once | ORAL | Status: DC
  Filled 2021-04-18: qty 120

## 2021-04-18 MED ORDER — SODIUM ZIRCONIUM CYCLOSILICATE 10 G PO PACK
10.0000 g | PACK | Freq: Once | ORAL | Status: AC
  Administered 2021-04-18: 10 g via ORAL
  Filled 2021-04-18: qty 1

## 2021-04-18 MED ORDER — SODIUM CHLORIDE 0.9 % IV SOLN: INTRAVENOUS | Status: AC

## 2021-04-18 NOTE — Progress Notes (Signed)
   VASCULAR SURGERY ASSESSMENT & PLAN:   RETROPERITONEAL HEMORRHAGE: The patient has a large retroperitoneal hemorrhage on the left.  Given the intramuscular hematoma this is most likely a spontaneous hemorrhage related to anticoagulation.  He had previous catheterization via a right femoral approach so I do not think this would be related to that.  Vascular surgery will continue to follow.  His anticoagulation is being reversed..   SUBJECTIVE:   Complains of some abdominal pain.  PHYSICAL EXAM:   Vitals:   04/18/21 0802 04/18/21 0920 04/18/21 1200 04/18/21 1415  BP: (!) 118/52 139/62 (!) 128/52   Pulse: 82 80    Resp: 15     Temp: 98.7 F (37.1 C)   98.5 F (36.9 C)  TempSrc: Oral   Axillary  SpO2: 98% 100% 98%   Weight:      Height:       Abdomen is definitely distended with some mild tenderness.  LABS:   Lab Results  Component Value Date   WBC 14.7 (H) 04/18/2021   HGB 6.4 (LL) 04/18/2021   HCT 20.8 (L) 04/18/2021   MCV 78.2 (L) 04/18/2021   PLT 300 04/18/2021   Lab Results  Component Value Date   CREATININE 1.14 04/14/2021   Lab Results  Component Value Date   INR 2.3 (H) 04/18/2021   CBG (last 3)  Recent Labs    04/17/21 2136 04/18/21 0606 04/18/21 1212  GLUCAP 127* 126* 115*    PROBLEM LIST:    Principal Problem:   Critical lower limb ischemia (HCC) Active Problems:   Chronic systolic heart failure (HCC)   Status post mitral valve replacement with bioprosthetic valve   Diabetes mellitus ([place])   Essential hypertension   ICD (implantable cardioverter-defibrillator) in place   Stage 3 chronic kidney disease (HCC)   CURRENT MEDS:    sodium chloride   Intravenous Once   sodium chloride   Intravenous Once   amiodarone  200 mg Oral Daily   atorvastatin  40 mg Oral q1800   docusate sodium  100 mg Oral BID   furosemide  20 mg Oral Daily   gabapentin  300 mg Oral QHS   insulin aspart  0-15 Units Subcutaneous TID WC   midodrine  10 mg Oral TID  WC   multivitamin with minerals  1 tablet Oral Daily   nutrition supplement (JUVEN)  1 packet Oral BID BM   pantoprazole  40 mg Oral Daily   phytonadione  2 mg Oral Once   Ensure Max Protein  11 oz Oral QHS   sodium chloride flush  3 mL Intravenous Q12H    Deitra Mayo Office: [phone] 04/18/2021

## 2021-04-18 NOTE — Consult Note (Signed)
Consultation Note Date: 04/18/2021   Patient Name: Ryan [name]  DOB: Feb 17, 1946  MRN: [id number]  Age / Sex: 75 y.o., male  PCP: [name], MD Referring Physician: Aileen Fass, Tammi Klippel, MD  Reason for Consultation: Establishing goals of care  HPI/Patient Profile: 75 y.o. male  with past medical history of atrial fibrillation on Coumadin, mitral valve replacement, chronic systolic heart failure with an AICD, essential hypertension, peripheral vascular disease, also history of peripheral vascular bypass with an ongoing wound infection admitted on 04/08/2021 for wound debridement. 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 Dropping hgb requiring transfusion - CT 7/30 revealed retroperitoneal hemorrhage. Vitamin K and FFP ordered along with dc of anticoagulation. PMT consulted to discuss [place] with family.  Clinical Assessment and Goals of Care: I have reviewed medical records including EPIC notes, labs and imaging, received report from Dr. Aileen Fass, assessed the patient and then spoke with patient's son, [name] and DIL, [name]  to discuss diagnosis prognosis, GOC, EOL wishes, disposition and options.  Attempted to speak with patient however he was too confused to discuss goals of care - asking me to help him get to the parking lot and then looking at me and telling me he was confused.   I introduced Palliative Medicine as specialized medical care for people living with serious illness. It focuses on providing relief from the symptoms and stress of a serious illness. The goal is to improve quality of life for both the patient and the family.  Son was at work and only had small amount of time to talk so conversation was brief.    We discussed patient's current illness and what it means in the larger context of patient's on-going co-morbidities.  They express understanding of illness and  difficult situation now with bleed and stopping anticoagulation - they understand risk of complications.   I attempted to elicit values and goals of care important to the patient. Family reports that patient has previously expressed a wish for full code/full scope care. They state he would not want long-term vent support but would want attempt at resuscitation.    Discussed with family the importance of continued conversation with family and the medical providers regarding overall plan of care and treatment options, ensuring decisions are within the context of the patient's values and GOCs.    We discussed eventual plan for rehab - family remains interested in this.  Questions and concerns were addressed. The family was encouraged to call with questions or concerns.   Primary Decision Maker NEXT OF KIN - at this point patient unable to make decisions independently    SUMMARY OF RECOMMENDATIONS   - short conversation as son was at work and did not have long to speak - full code/full scope - would not want long term vent support per family - plan for rehab after stabilized - will follow  Code Status/Advance Care Planning: Full code      Primary Diagnoses: Present on Admission:  Critical lower limb ischemia ([place])  Chronic systolic heart failure ([place])  ICD (implantable cardioverter-defibrillator) in place  Stage 3 chronic kidney disease ([place])  Essential hypertension   I have reviewed the medical record, interviewed the patient and family, and examined the patient. The following aspects are pertinent.  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[place])  CHF (congestive heart failure) (HCC)    Diabetes mellitus without complication ([place])    Endocarditis    Hypertension    Peripheral vascular disease (HCC)    PVC's (premature ventricular contractions)    SVT  (supraventricular tachycardia)  long RP    Social History   Socioeconomic History   Marital status: Divorced    Spouse name: Not on file   Number of children: 3   Years of education: Not on file   Highest education level: Not on file  Occupational History   Not on file  Tobacco Use   Smoking status: Never   Smokeless tobacco: Never  Vaping Use   Vaping Use: Never used  Substance and Sexual Activity   Alcohol use: No    Alcohol/week: 0.0 standard drinks   Drug use: No   Sexual activity: Not on file  Other Topics Concern   Not on file  Social History Narrative   Daughter lives with him.  Retired Advertising account planner   Social Determinants of Radio broadcast assistant Strain: Not on [employer] Insecurity: No Food Insecurity   Worried About Charity fundraiser in the Last Year: Never true   Arboriculturist in the Last Year: Never true  Transportation Needs: No Data processing manager (Medical): No   Lack of Transportation (Non-Medical): No  Physical Activity: Not on file  Stress: Not on file  Social Connections: Not on file   Family History  Problem Relation Age of Onset   Hypertension Mother    Hypertension Father    Scheduled Meds:  sodium chloride   Intravenous Once   sodium chloride   Intravenous Once   amiodarone  200 mg Oral Daily   atorvastatin  40 mg Oral q1800   docusate sodium  100 mg Oral BID   furosemide  20 mg Oral Daily   gabapentin  300 mg Oral QHS   insulin aspart  0-15 Units Subcutaneous TID WC   midodrine  10 mg Oral TID WC   multivitamin with minerals  1 tablet Oral Daily   nutrition supplement (JUVEN)  1 packet Oral BID BM   pantoprazole  40 mg Oral Daily   Ensure Max Protein  11 oz Oral QHS   sodium chloride flush  3 mL Intravenous Q12H   Continuous Infusions:  sodium chloride     PRN Meds:.sodium chloride, alum & mag hydroxide-simeth, bisacodyl, hydrALAZINE, HYDROcodone-acetaminophen, labetalol, methocarbamol,  morphine injection, ondansetron (ZOFRAN) IV, ondansetron **OR** [DISCONTINUED] ondansetron (ZOFRAN) IV, oxyCODONE, polyethylene glycol, sodium chloride flush Allergies  Allergen Reactions   Tuna [Fish Allergy] Nausea And Vomiting   Review of Systems  Unable to perform ROS: Mental status change   Physical Exam Constitutional:      General: He is not in acute distress.    Comments: lethargic  Pulmonary:     Effort: Pulmonary effort is normal.  Skin:    Coloration: Skin is pale.  Neurological:     Mental Status: He is disoriented.    Vital Signs: BP (!) 127/56   Pulse 83   Temp 97.6 F (36.4 C) (Axillary)   Resp 17   Ht 6' (1.829 m)   Wt 78.3 kg   SpO2 100%   BMI 23.41 kg/m  Pain Scale: 0-10   Pain Score: Asleep   SpO2: SpO2: 100 % O2 Device:SpO2: 100 % O2 Flow Rate: .O2 Flow Rate (L/min): 2 L/min  IO: Intake/output summary:  Intake/Output Summary (  Last 24 hours) at 04/18/2021 1634 Last data filed at 04/18/2021 0920 Gross per 24 hour  Intake 370 ml  Output --  Net 370 ml    LBM: Last BM Date: 04/13/21 Baseline Weight: Weight: 73.1 kg Most recent weight: Weight: 78.3 kg      Time Total: 45 minutes Greater than 50%  of this time was spent counseling and coordinating care related to the above assessment and plan.  Juel Burrow, DNP, AGNP-C Palliative Medicine Team [phone] Pager: [phone]

## 2021-04-18 NOTE — Significant Event (Signed)
I was notified by patient's nurse that patient's labs drawn at around 8:19 PM on April 18, 2021 was showing a potassium of 6.4 with a creatinine of 4.6.  This is a significant increase in creatinine from 1.1 on April 14, 2021.  Ordered a stat EKG which shows junctional rhythm.  I ordered stat calcium gluconate IV 1 g and repeat metabolic panel.  Also ordered D50 1 ampoule followed by 10 units of NovoLog insulin Lokelma 10 g.  I ordered a liter of fluid bolus.  We will get a CT renal study to make sure there is no obstruction.  We will repeat metabolic panel and EKG in few hours to see the trend of potassium creatinine and also his rhythm.  On exam at bedside patient is alert awake and following commands. Will also check UA and CK levels.  Gean Birchwood.

## 2021-04-18 NOTE — Progress Notes (Signed)
[facility] PROGRESS NOTE    Progress Note  Ryan [name]  MRN:[id number] DOB: 09/28/1945 DOA: 04/08/2021 PCP: Lee, Keung, MD     Brief Narrative:   Ryan [name] is an 75 y.o. 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  Scheduled to have debridement on 04/08/2021 but due to an INR of 3.2 surgery was postponed.  He eventually underwent partial amputation with excisional debridement and wound VAC placement on 04/09/2021, left lower extremity angiogram in 04/13/2021   Assessment/Plan:   Right critical lower limb ischemia (HCC) Admit on 04/09/2021 underwent right first toe partial amputation along with a second, right heel Sharp excisional debridement application of myriad skin substitute and wound VAC  Wound VAC placement on 04/09/2021 his last day of antibiotics was 04/12/2021 Surgery relates to continue dressing changes and wound care.   Wound VAC to be changed by wound care nurse. He also recommended to keep in placement for 2 weeks postprocedure ([id number]) follow-up with them as an outpatient. He will be discharged with wound VAC to skilled nursing facility.  New left thigh pain: X-ray of the spine showed an indeterminate wedge fracture of L5 and T1 and moderate multilevel degenerative changes. Pain is not controlled, get an MRI of the lumbar spine.  New acute blood loss anemia: He is currently on IV heparin and Coumadin for goal INR of 2-3, this morning is leukocytosis. Discontinue Lovenox, hold Coumadin. Check FOBT x3 transfuse 2 units packed red blood cells, check a CBC posttransfusion.  Chronic systolic heart failure (HCC) Continue oral Lasix.  Cont.midodrine. Has a follow-up appointment  with the advance heart failure team to rule out multiple myeloma question amyloid as an outpatient.  Permanent atrial fibrillation/Status post mitral valve replacement with bioprosthetic valve: Bioprosthetic,  Transition IV heparin to Lovenox for goal INR 2-3.  Diabetes mellitus type 2: With an A1c of 5.4 continue sliding scale insulin, will control blood glucose.  Essential HTN: Cont.midodrine, continue amiodarone blood pressure is elevated this morning. We will continue to monitor closely.  Blood pressure currently be elevated due to pain.  Hyperlipidemia: Continue statins.  Stage 3 chronic kidney disease (HCC) Stable.  DVT prophylaxis: IV heparin with Coumadin bridging. Family Communication: None Status is: Inpatient  Remains inpatient appropriate because:Hemodynamically unstable  Dispo: The patient is from: Home              Anticipated d/c is to: Home              Patient currently is not medically stable to d/c.   Difficult to place patient No    Code Status:     Code Status Orders  (From admission, onward)           Start     Ordered   04/08/21 1211  Full code  Continuous        04/08/21 1212           Code Status History     Date Active Date Inactive Code Status Order ID Comments User Context   03/06/2021 1816 03/13/2021 0325 Full Code [id number]  Setzer, Sandra J, PA-C Inpatient   02/25/2021 1202 02/25/2021 2000 Full Code [id number]  Hawken, Felicia N, MD Inpatient   01/30/2021 1513 01/30/2021 2320 Full Code [id number]  McLean, Dalton   S, MD Inpatient   08/26/2020 1818 09/06/2020 2203 Full Code [id number]  Duke, Angela Nicole, PA Inpatient   06/11/2016 1627 06/12/2016 1608 Full Code [id number]  Camnitz, Will Martin, MD Inpatient   08/30/2015 0114 08/30/2015 1540 Full Code [id number]  Danford, Christopher P, MD Inpatient   09/29/2012 1803 10/06/2012 2104 Full Code [id number]  Price, [name] Leigh, RN Inpatient   09/26/2012 1331 09/29/2012 1803 Full Code [id number]  Marsh,  Stephanie M, RN Inpatient         IV Access:   Peripheral IV   Procedures and diagnostic studies:   DG Lumbar Spine 2-3 Views  Result Date: 04/16/2021 CLINICAL DATA:  Lower back pain EXAM: LUMBAR SPINE - 2-3 VIEW COMPARISON:  None. FINDINGS: There are five non-rib bearing lumbar-type vertebral bodies. There is normal alignment. There is mild age indeterminate wedging of L5 and T11. Moderate intervertebral disc space height loss of L4-5 and L5-S1. Mild intervertebral disc space height loss of L1-2 and L2-3. Multilevel endplate proliferative changes and facet arthropathy. Endplate sclerosis of L5, likely degenerative. Atherosclerotic calcifications. Diffuse gaseous distension of loops of bowel with air visualized in the rectum. IMPRESSION: 1. Mild age-indeterminate wedging of L5 and T11. Recommend correlation with point tenderness. 2. Moderate multilevel degenerative changes. 3. Atherosclerotic calcifications. Electronically Signed   By: Stephanie  Peacock MD   On: 04/16/2021 13:20     Medical Consultants:   None.   Subjective:    Ryan [name] thigh pain is improved he is not complaining of abdominal pain has not had a bowel movement.  Objective:    Vitals:   04/18/21 0552 04/18/21 0630 04/18/21 0736 04/18/21 0802  BP: (!) 114/54 (!) 109/51 (!) 122/55 (!) 118/52  Pulse: 82  82 82  Resp: 16 16 17 15  Temp: 99.2 F (37.3 C) 99 F (37.2 C) 98.5 F (36.9 C) 98.7 F (37.1 C)  TempSrc: Oral Oral Oral Oral  SpO2: 98% 95% 100% 98%  Weight:      Height:       SpO2: 98 % O2 Flow Rate (L/min): 2 L/min   Intake/Output Summary (Last 24 hours) at 04/18/2021 0835 Last data filed at 04/17/2021 1504 Gross per 24 hour  Intake 0 ml  Output --  Net 0 ml    Filed Weights   04/16/21 0428 04/17/21 0331 04/18/21 0359  Weight: 81.7 kg 78.3 kg 78.3 kg    Exam: General exam: In no acute distress. Respiratory system: Good air movement and clear to auscultation. Cardiovascular  system: S1 & S2 heard, RRR. No JVD. Gastrointestinal system: Abdomen is nondistended, soft and nontender.  Central nervous system: Straight leg positive on the left Extremities: No pedal edema. Skin: No rashes, lesions or ulcers  Data Reviewed:    Labs: Basic Metabolic Panel: Recent Labs  Lab 04/13/21 1113 04/14/21 0216  NA 137 137  K 3.9 4.2  CL 105 106  CO2 24 22  GLUCOSE 104* 88  BUN 14 18  CREATININE 1.23 1.14  CALCIUM 8.8* 9.0    GFR Estimated Creatinine Clearance: 59.6 mL/min (by C-G formula based on SCr of 1.14 mg/dL). Liver Function Tests: No results for input(s): AST, ALT, ALKPHOS, BILITOT, PROT, ALBUMIN in the last 168 hours. No results for input(s): LIPASE, AMYLASE in the last 168 hours. No results for input(s): AMMONIA in the last 168 hours. Coagulation profile Recent Labs  Lab 04/13/21 1113 04/15/21 0940 04/17/21 0115 04/18/21 0106  INR 1.5* 1.4* 1.5* 2.3*    COVID-19 Labs    No results for input(s): DDIMER, FERRITIN, LDH, CRP in the last 72 hours.  Lab Results  Component Value Date   SARSCOV2NAA NEGATIVE 04/08/2021   SARSCOV2NAA NEGATIVE 03/03/2021   SARSCOV2NAA NEGATIVE 01/28/2021   SARSCOV2NAA NEGATIVE 08/26/2020    CBC: Recent Labs  Lab 04/14/21 0216 04/15/21 0353 04/16/21 0100 04/17/21 0115 04/18/21 0106  WBC 8.3 8.2 8.3 9.7 14.7*  HGB 10.7* 9.3* 8.9* 7.9* 6.4*  HCT 35.2* 31.1* 28.9* 26.6* 20.8*  MCV 79.6* 79.3* 79.4* 80.1 78.2*  PLT 157 161 193 280 300    Cardiac Enzymes: No results for input(s): CKTOTAL, CKMB, CKMBINDEX, TROPONINI in the last 168 hours. BNP (last 3 results) Recent Labs    08/25/20 1121 03/27/21 1123  PROBNP 21,395* 3,146*    CBG: Recent Labs  Lab 04/17/21 0627 04/17/21 1054 04/17/21 1605 04/17/21 2136 04/18/21 0606  GLUCAP 146* 147* 126* 127* 126*    D-Dimer: No results for input(s): DDIMER in the last 72 hours. Hgb A1c: No results for input(s): HGBA1C in the last 72 hours. Lipid  Profile: No results for input(s): CHOL, HDL, LDLCALC, TRIG, CHOLHDL, LDLDIRECT in the last 72 hours. Thyroid function studies: No results for input(s): TSH, T4TOTAL, T3FREE, THYROIDAB in the last 72 hours.  Invalid input(s): FREET3 Anemia work up: No results for input(s): VITAMINB12, FOLATE, FERRITIN, TIBC, IRON, RETICCTPCT in the last 72 hours. Sepsis Labs: Recent Labs  Lab 04/15/21 0353 04/16/21 0100 04/17/21 0115 04/18/21 0106  WBC 8.2 8.3 9.7 14.7*    Microbiology Recent Results (from the past 240 hour(s))  Surgical pcr screen     Status: None   Collection Time: 04/08/21  9:15 AM   Specimen: Nasal Mucosa; Nasal Swab  Result Value Ref Range Status   MRSA, PCR NEGATIVE NEGATIVE Final   Staphylococcus aureus NEGATIVE NEGATIVE Final    Comment: (NOTE) The Xpert SA Assay (FDA approved for NASAL specimens in patients 22 years of age and older), is one component of a comprehensive surveillance program. It is not intended to diagnose infection nor to guide or monitor treatment. Performed at [facility], [address] [place], [place] [address]   Blood Cultures x 2 sites     Status: None   Collection Time: 04/08/21  6:16 PM   Specimen: BLOOD LEFT WRIST  Result Value Ref Range Status   Specimen Description BLOOD LEFT WRIST  Final   Special Requests   Final    BOTTLES DRAWN AEROBIC ONLY Blood Culture adequate volume   Culture   Final    NO GROWTH 5 DAYS Performed at [facility], [address] [place], [place] [address]    Report Status 04/13/2021 FINAL  Final  Blood Cultures x 2 sites     Status: None   Collection Time: 04/08/21  6:24 PM   Specimen: BLOOD RIGHT WRIST  Result Value Ref Range Status   Specimen Description BLOOD RIGHT WRIST  Final   Special Requests   Final    BOTTLES DRAWN AEROBIC ONLY Blood Culture adequate volume   Culture   Final    NO GROWTH 5 DAYS Performed at [facility], [address] [place], [place] [address]     Report Status 04/13/2021 FINAL  Final     Medications:    amiodarone  200 mg Oral Daily   atorvastatin  40 mg Oral q1800   docusate sodium  100 mg Oral BID   enoxaparin (LOVENOX) injection  75 mg Subcutaneous Q12H     furosemide  20 mg Oral Daily   gabapentin  300 mg Oral QHS   insulin aspart  0-15 Units Subcutaneous TID WC   midodrine  10 mg Oral TID WC   multivitamin with minerals  1 tablet Oral Daily   nutrition supplement (JUVEN)  1 packet Oral BID BM   pantoprazole  40 mg Oral Daily   Ensure Max Protein  11 oz Oral QHS   sodium chloride flush  3 mL Intravenous Q12H   Warfarin - Pharmacist Dosing Inpatient   Does not apply q1600   Continuous Infusions:  sodium chloride        LOS: 10 days   Charlynne Cousins  [facility]  04/18/2021, 8:35 AM

## 2021-04-18 NOTE — Progress Notes (Signed)
CRITICAL VALUE STICKER  CRITICAL VALUE: Hemoglobin=6.4  RECEIVER (on-site recipient of call): Manuela Schwartz RN  DATE & TIME NOTIFIED: 04/18/2021 0205  MESSENGER (representative from lab): Messan Houegnifio  MD NOTIFIED: Dr. Cyd Silence  TIME OF NOTIFICATION:0210  RESPONSE:  New orders placed in [facility] to be carried out.

## 2021-04-18 NOTE — Progress Notes (Signed)
   04/18/21 2100  Provider Notification  Provider Name/Title Hal Hope MD  Date Provider Notified 04/18/21  Time Provider Notified 2100  Notification Type Page  Notification Reason Critical result  Test performed and critical result Potassium 6.4  Date Critical Result Received 04/18/21  Time Critical Result Received 2058  Provider response See new orders  Date of Provider Response 04/18/21  Time of Provider Response 2102   On-call for [facility] notified of potassium of 6.4. STAT EKG and repeat BMP ordered. Calcium gluconate IV ordered, awaiting to be sent from pharmacy. Will continue to monitor.  Jaymes Graff, RN

## 2021-04-18 NOTE — TOC Progression Note (Signed)
Transition of Care [facility]) - Progression Note    Patient Details  Name: Ryan [name] MRN: [id number] Date of Birth: [birth year]  Transition of Care [facility]) CM/SW [place], [place] Phone Number: [phone] 04/18/2021, 11:55 AM  Clinical Narrative:     CSW spoke with Juliann Pulse and she informed CSW that she would check on the co-pay days. Juliann Pulse asked about pt's COVID and booster status as well. CSW informed Juliann Pulse that she would follow up about pt's wound vac to see if it had been delivered. Juliann Pulse stated that the facility could also provide a wound vac.  CSW spoke with pt and pt's son [name] who was via phone about COVID vaccination status.Pt confirmed that he has had both vaccinations and has had booster in March/April. CSW let pt know that he would need copies of his cards.  CSW confirmed with RNCM that wound vac had not been delivered to home. CSW alert Juliann Pulse.  TOC team will continue to assist with discharge planning needs.    Expected Discharge Plan: [place] Services Barriers to Discharge: Other (must enter comment), Continued Medical Work up (Awaiting Wound Vac approval and  homehealth pending)  Expected Discharge Plan and Services Expected Discharge Plan: [place] In-house Referral: [facility] Discharge Planning Services: CM Consult Post Acute Care Choice: [place] arrangements for the past 2 months: [place]                 DME Arranged: Negative pressure wound device, Vac ([place]) DME Agency: Other - Comment, [employer] Date DME Agency Contacted: 04/15/21 Time DME Agency Contacted: 1200 Representative spoke with at DME Agency: Olivia Mackie HH Arranged: RN, PT, OT [facility] Agency: [place] Date [place]: 04/15/21 Time [place]: 81 Representative spoke with at [place]: [place] ([place]) Interventions    Readmission Risk Interventions Readmission Risk  Prevention Plan 04/15/2021  Home Care Screening Complete  Medication Review (RN CM) Complete  Some recent data might be hidden

## 2021-04-18 NOTE — Progress Notes (Signed)
HOSPITAL MEDICINE OVERNIGHT EVENT NOTE    Notified by nursing that patient is exhibiting substantial anemia this morning with hemoglobin 6.4.  Chart reviewed, patient's hemoglobin has down trended from approximately 10 four days ago to 6.4 today.    Per my discussion with nursing patient exhibits no obvious evidence of bleeding.  There is no blood in the stool, no heavy bruising and no melena.  Patient is on full dose anticoagulation however.  It is possible that patient has a slow gastrointestinal bleed that is not grossly apparent.  Alternatively patient could be experiencing slow bleeding from his postsurgical wounds.  Will initiate 1 unit packed red blood cell transfusion with posttransfusion H&H.  We will continue to monitor for any evidence of bleeding.  Will obtain stool Hemoccult and if positive will consider GI consultation.   Vernelle Emerald  MD [facility]

## 2021-04-18 NOTE — TOC Progression Note (Signed)
Transition of Care [facility]) - Progression Note    Patient Details  Name: Ryan [name] MRN: [id number] Date of Birth: [birth year]  Transition of Care [facility]) CM/SW [place], [place] Phone Number: 04/18/2021, 8:34 AM  Clinical Narrative:    CSW sent Juliann Pulse at [facility] concerning contacting pt for co-pay amount for SNF.  TOC will continue to assist with disposition planning.   Expected Discharge Plan: [place] Services Barriers to Discharge: Other (must enter comment), Continued Medical Work up (Awaiting Wound Vac approval and  homehealth pending)  Expected Discharge Plan and Services Expected Discharge Plan: [place] In-house Referral: [facility] Discharge Planning Services: CM Consult Post Acute Care Choice: [place] arrangements for the past 2 months: [place]                 DME Arranged: Negative pressure wound device, Vac ([place]) DME Agency: Other - Comment, [employer] Date DME Agency Contacted: 04/15/21 Time DME Agency Contacted: 1200 Representative spoke with at DME Agency: Olivia Mackie HH Arranged: RN, PT, OT [facility] Agency: [place] Date [place]: 04/15/21 Time [place]: 78 Representative spoke with at [place]: [place] ([place]) Interventions    Readmission Risk Interventions Readmission Risk Prevention Plan 04/15/2021  Home Care Screening Complete  Medication Review (RN CM) Complete  Some recent data might be hidden

## 2021-04-19 ENCOUNTER — Inpatient Hospital Stay (HOSPITAL_COMMUNITY): Payer: Medicare HMO

## 2021-04-19 DIAGNOSIS — I70229 Atherosclerosis of native arteries of extremities with rest pain, unspecified extremity: Secondary | ICD-10-CM | POA: Diagnosis not present

## 2021-04-19 LAB — BASIC METABOLIC PANEL
Anion gap: 13 (ref 5–15)
Anion gap: 12 (ref 5–15)
BUN: 54 mg/dL — ABNORMAL HIGH (ref 8–23)
BUN: 59 mg/dL — ABNORMAL HIGH (ref 8–23)
CO2: 21 mmol/L — ABNORMAL LOW (ref 22–32)
CO2: 20 mmol/L — ABNORMAL LOW (ref 22–32)
Calcium: 8.6 mg/dL — ABNORMAL LOW (ref 8.9–10.3)
Calcium: 8.2 mg/dL — ABNORMAL LOW (ref 8.9–10.3)
Chloride: 100 mmol/L (ref 98–111)
Chloride: 100 mmol/L (ref 98–111)
Creatinine, Ser: 4.82 mg/dL — ABNORMAL HIGH (ref 0.61–1.24)
Creatinine, Ser: 5.12 mg/dL — ABNORMAL HIGH (ref 0.61–1.24)
GFR, Estimated: 12 mL/min — ABNORMAL LOW (ref >60)
GFR, Estimated: 11 mL/min — ABNORMAL LOW (ref >60)
Glucose, Bld: 115 mg/dL — ABNORMAL HIGH (ref 70–99)
Glucose, Bld: 122 mg/dL — ABNORMAL HIGH (ref 70–99)
Potassium: 6 mmol/L — ABNORMAL HIGH (ref 3.5–5.1)
Potassium: 5.6 mmol/L — ABNORMAL HIGH (ref 3.5–5.1)
Sodium: 134 mmol/L — ABNORMAL LOW (ref 135–145)
Sodium: 132 mmol/L — ABNORMAL LOW (ref 135–145)

## 2021-04-19 LAB — BPAM FFP
Blood Product Expiration Date: 202208042359
Blood Product Expiration Date: 202208042359
ISSUE DATE / TIME: 202207301558
ISSUE DATE / TIME: 202207301959
Unit Type and Rh: 600
Unit Type and Rh: 6200

## 2021-04-19 LAB — CBC
HCT: 23.7 % — ABNORMAL LOW (ref 39.0–52.0)
Hemoglobin: 7.5 g/dL — ABNORMAL LOW (ref 13.0–17.0)
MCH: 25.3 pg — ABNORMAL LOW (ref 26.0–34.0)
MCHC: 31.6 g/dL (ref 30.0–36.0)
MCV: 79.8 fL — ABNORMAL LOW (ref 80.0–100.0)
Platelets: 214 10*3/uL (ref 150–400)
RBC: 2.97 MIL/uL — ABNORMAL LOW (ref 4.22–5.81)
RDW: 18.4 % — ABNORMAL HIGH (ref 11.5–15.5)
WBC: 18.3 10*3/uL — ABNORMAL HIGH (ref 4.0–10.5)
nRBC: 0.2 % (ref 0.0–0.2)

## 2021-04-19 LAB — PREPARE FRESH FROZEN PLASMA
Unit division: 0
Unit division: 0

## 2021-04-19 LAB — GLUCOSE, CAPILLARY
Glucose-Capillary: 121 mg/dL — ABNORMAL HIGH (ref 70–99)
Glucose-Capillary: 129 mg/dL — ABNORMAL HIGH (ref 70–99)
Glucose-Capillary: 118 mg/dL — ABNORMAL HIGH (ref 70–99)
Glucose-Capillary: 123 mg/dL — ABNORMAL HIGH (ref 70–99)

## 2021-04-19 LAB — PROTIME-INR
INR: 2.1 — ABNORMAL HIGH (ref 0.8–1.2)
INR: 1.5 — ABNORMAL HIGH (ref 0.8–1.2)
Prothrombin Time: 23.2 seconds — ABNORMAL HIGH (ref 11.4–15.2)
Prothrombin Time: 17.9 seconds — ABNORMAL HIGH (ref 11.4–15.2)

## 2021-04-19 MED ORDER — SODIUM ZIRCONIUM CYCLOSILICATE 10 G PO PACK
10.0000 g | PACK | Freq: Once | ORAL | Status: AC
  Administered 2021-04-19: 10 g via ORAL
  Filled 2021-04-19: qty 1

## 2021-04-19 MED ORDER — SODIUM CHLORIDE 0.9% IV SOLUTION: Freq: Once | INTRAVENOUS | Status: AC

## 2021-04-19 MED ORDER — OXYCODONE HCL 5 MG PO TABS
5.0000 mg | ORAL_TABLET | Freq: Once | ORAL | Status: AC
Start: 2021-04-19 — End: 2021-04-19
  Administered 2021-04-19: 5 mg via ORAL
  Filled 2021-04-19: qty 1

## 2021-04-19 MED ORDER — CALCIUM GLUCONATE-NACL 1-0.675 GM/50ML-% IV SOLN
1.0000 g | Freq: Once | INTRAVENOUS | Status: AC
  Administered 2021-04-19: 1000 mg via INTRAVENOUS
  Filled 2021-04-19: qty 50

## 2021-04-19 MED ORDER — VITAMIN K1 10 MG/ML IJ SOLN
5.0000 mg | Freq: Once | INTRAVENOUS | Status: AC
  Administered 2021-04-19: 5 mg via INTRAVENOUS
  Filled 2021-04-19: qty 0.5

## 2021-04-19 MED ORDER — SODIUM CHLORIDE 0.9 % IV SOLN: 250.0000 mL | INTRAVENOUS | Status: DC | PRN

## 2021-04-19 MED ORDER — SODIUM CHLORIDE 0.9 % IV SOLN: INTRAVENOUS | Status: DC

## 2021-04-19 MED ORDER — SODIUM CHLORIDE 0.9 % IV BOLUS
500.0000 mL | Freq: Once | INTRAVENOUS | Status: AC
  Administered 2021-04-19: 500 mL via INTRAVENOUS

## 2021-04-19 MED ORDER — SODIUM ZIRCONIUM CYCLOSILICATE 10 G PO PACK
10.0000 g | PACK | Freq: Two times a day (BID) | ORAL | Status: DC
  Administered 2021-04-19 – 2021-04-20 (×4): 10 g via ORAL
  Filled 2021-04-19 (×3): qty 1

## 2021-04-19 MED ORDER — SODIUM CHLORIDE 0.9% IV SOLUTION: Freq: Once | INTRAVENOUS | Status: DC

## 2021-04-19 NOTE — Progress Notes (Addendum)
  Progress Note    04/19/2021 12:10 PM 6 Days Post-Op  Subjective:  no complaints   Vitals:   04/19/21 0727 04/19/21 1149  BP: (!) 122/56 109/64  Pulse: 84 81  Resp: 18 17  Temp: 98.4 F (36.9 C) 98.9 F (37.2 C)  SpO2: 100%    Physical Exam: Cardiac:  regular Lungs:  non labored Incisions:  Right foot dressed, wound VAC to right heel Abdomen:  distended, soft, mildly tender  CBC    Component Value Date/Time   WBC 18.3 (H) 04/19/2021 0150   RBC 2.97 (L) 04/19/2021 0150   HGB 7.5 (L) 04/19/2021 0150   HGB 9.6 (L) 03/27/2021 1123   HCT 23.7 (L) 04/19/2021 0150   HCT 30.4 (L) 03/27/2021 1123   PLT 214 04/19/2021 0150   PLT 362 03/27/2021 1123   MCV 79.8 (L) 04/19/2021 0150   MCV 77 (L) 03/27/2021 1123   MCH 25.3 (L) 04/19/2021 0150   MCHC 31.6 04/19/2021 0150   RDW 18.4 (H) 04/19/2021 0150   RDW 17.2 (H) 03/27/2021 1123   LYMPHSABS 1,363 06/01/2016 1414   MONOABS 517 06/01/2016 1414   EOSABS 141 06/01/2016 1414   BASOSABS 47 06/01/2016 1414    BMET    Component Value Date/Time   NA 132 (L) 04/19/2021 0559   NA 142 03/27/2021 1123   K 5.6 (H) 04/19/2021 0559   CL 100 04/19/2021 0559   CO2 20 (L) 04/19/2021 0559   GLUCOSE 122 (H) 04/19/2021 0559   BUN 59 (H) 04/19/2021 0559   BUN 17 03/27/2021 1123   CREATININE 5.12 (H) 04/19/2021 0559   CREATININE 1.17 06/01/2016 1414   CALCIUM 8.2 (L) 04/19/2021 0559   GFRNONAA 11 (L) 04/19/2021 0559   GFRAA 39 (L) 10/01/2020 1157    INR    Component Value Date/Time   INR 2.1 (H) 04/19/2021 0150     Intake/Output Summary (Last 24 hours) at 04/19/2021 1210 Last data filed at 04/19/2021 0307 Gross per 24 hour  Intake 1555.29 ml  Output 50 ml  Net 1505.29 ml     Assessment/Plan:  75 y.o. male is s/p  1) right first toe partial toe amputation 2) right second toe amputation 3) right heel sharp excisional debridement Wound vac placed to heel wound 6 Days Post-Op . Now with acute L RP hematoma felt to be  spontaneous as Angiogram was performed via right Femoral access. More likely related to anticoagulation  Acute L RP hematoma- may be causing some ureteral compression/ L sided uretero- hydronephrosis noted AKI on CKD III Scr now 5  Possible HD over next couple days per Nephrology Still working on reversing anticoagulation Will need wound VAC changed tomorrow to R heel  Karoline Caldwell, PA-C [facility] [phone] 04/19/2021 12:10 PM  I have interviewed the patient and examined the patient. I agree with the findings by the PA.  His hemoglobin is 7.5 today which is stable.  Gae Gallop, MD

## 2021-04-19 NOTE — Progress Notes (Addendum)
[facility] PROGRESS NOTE    Progress Note  Ryan [name]  YIF:[id number] DOB: 1946/07/03 DOA: 04/08/2021 PCP: [name], MD     Brief Narrative:   Ryan [name] is an 75 y.o. 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  Scheduled to have debridement on 04/08/2021 but due to an INR of 3.2 surgery was postponed.  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  He has completed his course of antibiotics in house last day was 04/13/2019   Assessment/Plan:   Right critical lower limb ischemia [facility]): Surgery relates to continue dressing changes and wound care.   Wound VAC to be changed by wound care nurse. He also recommended to keep in placement for 2 weeks postprocedure ([id number]) follow-up with them as an outpatient. He will be discharged with wound VAC to skilled nursing facility.  New retroperitoneal hematoma/acute blood loss anemia: CT scan of the abdomen and pelvis showed new retroperitoneal hematoma he was given vitamin K and fresh frozen plasma, as his INR was 2.1 after intervention her his INR has remained 2.0. He was given vitamin K intravenously and another unit of fresh frozen plasma repeat INR this afternoon. Vascular surgery is on board and agree with current management. Hemoglobin now is 7.5.  New Acute kidney injury kidney disease stage III: In the setting of hypotension, and anemia. Despite fluid resuscitation, fresh frozen plasma and blood his renal function continues to deteriorate. We will continue him on normal saline and will consult renal. His urine output has  significantly decreased only made about 350 cc of urine. Repeated CT scan of the kidneys Show similar appearance left retroperitoneal hematoma with left hydronephrosis similar appearance of intramuscular hematoma in the iliopsoas muscle.  Junctional rhythm due to hyperkalemia: Likely due to intra-abdominal bleeding, he was given calcium, insulin and dextrose as well with Lokelma and his potassium is slowly coming down.  Chronic systolic heart failure (HCC) Hold Lasix.  Cont.midodrine. Has a follow-up appointment with the advance heart failure team to rule out multiple myeloma question amyloid as an outpatient.  Permanent atrial fibrillation/Status post mitral valve replacement with bioprosthetic valve: Bioprosthetic valve. Hold heparin and Coumadin.  Diabetes mellitus type 2: With an A1c of 5.4 continue sliding scale insulin, will control blood glucose.  Essential HTN: Cont.midodrine, continue amiodarone blood pressure is elevated this morning. We will continue to monitor closely.  Blood pressure currently be elevated due to pain.  Hyperlipidemia: Continue statins.   DVT prophylaxis: Hold IV heparin and Coumadin, His anticoagulation has been reversed. Family Communication: None Status is: Inpatient  Remains inpatient appropriate because:Hemodynamically unstable  Dispo: The patient is from: Home              Anticipated d/c is to: Home              Patient currently is not medically stable to d/c.   Difficult to place patient No    Code Status:     Code Status Orders  (From admission, onward)           Start     Ordered   04/08/21 1211  Full code  Continuous        04/08/21 1212  Code Status History     Date Active Date Inactive Code Status Order ID Comments User Context   03/06/2021 1816 03/13/2021 0325 Full Code [id number]  Carlos Levering Inpatient   02/25/2021 1202 02/25/2021 2000 Full Code [id number]  Leonie Douglas, MD Inpatient   01/30/2021  1513 01/30/2021 2320 Full Code [id number]  Laurey Morale, MD Inpatient   08/26/2020 1818 09/06/2020 2203 Full Code [id number]  Marcelino Duster, PA Inpatient   06/11/2016 1627 06/12/2016 1608 Full Code [id number]  Regan Lemming, MD Inpatient   08/30/2015 0114 08/30/2015 1540 Full Code [id number]  Alberteen Sam, MD Inpatient   09/29/2012 1803 10/06/2012 2104 Full Code [id number]  Ninetta Lights, RN Inpatient   09/26/2012 1331 09/29/2012 1803 Full Code [id number]  Redmond Pulling, RN Inpatient         IV Access:   Peripheral IV   Procedures and diagnostic studies:   CT ABDOMEN PELVIS WO CONTRAST  Result Date: 04/18/2021 CLINICAL DATA:  Patient with anemia.  On anticoagulation. EXAM: CT ABDOMEN AND PELVIS WITHOUT CONTRAST TECHNIQUE: Multidetector CT imaging of the abdomen and pelvis was performed following the standard protocol without IV contrast. COMPARISON:  Renal ultrasound 03/09/2021 FINDINGS: Lower chest: Patchy ground-glass and consolidative opacities within the lower lobes bilaterally most compatible with atelectasis. Trace bilateral pleural effusions. Hepatobiliary: Liver is normal in size and contour. Stones and sludge within the gallbladder lumen. No intrahepatic or extrahepatic biliary ductal dilatation. Pancreas: Unremarkable Spleen: Unremarkable Adrenals/Urinary Tract: Normal adrenal glands. The left kidney is displaced cranially. There is moderate left hydronephrosis. Urinary bladder is unremarkable. Stomach/Bowel: No abnormal bowel wall thickening or evidence for bowel obstruction. No free intraperitoneal air. Vascular/Lymphatic: Normal caliber abdominal aorta. No retroperitoneal lymphadenopathy. Reproductive: Unremarkable. Other: There is a large 17 x 17 x 7 cm left retroperitoneal fluid collection with layering high density favored to represent blood products. Musculoskeletal: Mild expansion of the left iliacus muscle. Right hip joint degenerative changes. Lower  thoracic and lumbar spine degenerative changes. There is mild height loss of the superior endplate of the L5 and L3 vertebral bodies. IMPRESSION: There is a large (up to 17 cm) left retroperitoneal hematoma. Additionally there is expansion of the left iliacus muscle most compatible with intramuscular hemorrhage. The large hematoma displaces the left kidney cranially. There is new moderate left hydronephrosis, likely secondary to mass effect on the left ureter. Age-indeterminate superior endplate compression deformities of the L5 and L3 vertebral bodies. Critical Value/emergent results were discussed at the time of interpretation on 04/18/2021 at 1:47 pm to provider [facility] , who verbally acknowledged these results. Electronically Signed   By: Annia Belt M.D.   On: 04/18/2021 13:52     Medical Consultants:   None.   Subjective:    Ryan [name] relates his pain is controlled once to have a bowel movement.  Objective:    Vitals:   04/19/21 0309 04/19/21 0400 04/19/21 0500 04/19/21 0610  BP: (!) 117/50 (!) 117/54 (!) 104/56 (!) 117/57  Pulse:      Resp: 18     Temp: 98.2 F (36.8 C)     TempSrc: Oral     SpO2: 98% 100% 100% 100%  Weight: 80.3 kg     Height:       SpO2: 100 % O2 Flow Rate (L/min): 2 L/min   Intake/Output Summary (Last 24 hours) at 04/19/2021 0655 Last data filed at 04/19/2021 0307 Gross per 24 hour  Intake 1925.29 ml  Output 50 ml  Net 1875.29 ml    Filed Weights   04/17/21 0331 04/18/21 0359 04/19/21 0309  Weight: 78.3 kg 78.3 kg 80.3 kg    Exam: General exam: In no acute distress. Respiratory system: Good air movement and clear to auscultation. Cardiovascular system: S1 & S2 heard, RRR. No JVD. Gastrointestinal system: Abdomen is nondistended, soft and nontender.  Extremities: No pedal edema. Skin: No rashes, lesions or ulcers Data Reviewed:    Labs: Basic Metabolic Panel: Recent Labs  Lab 04/14/21 0216 04/18/21 2019  04/18/21 2207 04/19/21 0150 04/19/21 0559  NA 137 134* 135 134* 132*  K 4.2 6.4* 6.1* 6.0* 5.6*  CL 106 101 102 100 100  CO2 22 22 22  21* 20*  GLUCOSE 88 143* 137* 115* 122*  BUN 18 52* 51* 54* 59*  CREATININE 1.14 4.66* 4.64* 4.82* 5.12*  CALCIUM 9.0 8.4* 8.1* 8.6* 8.2*  PHOS  --   --  5.9*  --   --     GFR Estimated Creatinine Clearance: 13.3 mL/min (A) (by C-G formula based on SCr of 5.12 mg/dL (H)). Liver Function Tests: No results for input(s): AST, ALT, ALKPHOS, BILITOT, PROT, ALBUMIN in the last 168 hours. No results for input(s): LIPASE, AMYLASE in the last 168 hours. No results for input(s): AMMONIA in the last 168 hours. Coagulation profile Recent Labs  Lab 04/15/21 0940 04/17/21 0115 04/18/21 0106 04/18/21 2207 04/19/21 0150  INR 1.4* 1.5* 2.3* 2.0* 2.1*    COVID-19 Labs  No results for input(s): DDIMER, FERRITIN, LDH, CRP in the last 72 hours.  Lab Results  Component Value Date   SARSCOV2NAA NEGATIVE 04/08/2021   SARSCOV2NAA NEGATIVE 03/03/2021   SARSCOV2NAA NEGATIVE 01/28/2021   SARSCOV2NAA NEGATIVE 08/26/2020    CBC: Recent Labs  Lab 04/16/21 0100 04/17/21 0115 04/18/21 0106 04/18/21 2019 04/19/21 0150  WBC 8.3 9.7 14.7* 19.1* 18.3*  HGB 8.9* 7.9* 6.4* 7.0* 7.5*  HCT 28.9* 26.6* 20.8* 22.2* 23.7*  MCV 79.4* 80.1 78.2* 78.4* 79.8*  PLT 193 280 300 275 214    Cardiac Enzymes: Recent Labs  Lab 04/18/21 2207  CKTOTAL 3,479*   BNP (last 3 results) Recent Labs    08/25/20 1121 03/27/21 1123  PROBNP 21,395* 3,146*    CBG: Recent Labs  Lab 04/18/21 0606 04/18/21 1212 04/18/21 1734 04/18/21 2219 04/19/21 0649  GLUCAP 126* 115* 156* 151* 121*    D-Dimer: No results for input(s): DDIMER in the last 72 hours. Hgb A1c: No results for input(s): HGBA1C in the last 72 hours. Lipid Profile: No results for input(s): CHOL, HDL, LDLCALC, TRIG, CHOLHDL, LDLDIRECT in the last 72 hours. Thyroid function studies: No results for  input(s): TSH, T4TOTAL, T3FREE, THYROIDAB in the last 72 hours.  Invalid input(s): FREET3 Anemia work up: No results for input(s): VITAMINB12, FOLATE, FERRITIN, TIBC, IRON, RETICCTPCT in the last 72 hours. Sepsis Labs: Recent Labs  Lab 04/17/21 0115 04/18/21 0106 04/18/21 2019 04/18/21 2207 04/19/21 0150  WBC 9.7 14.7* 19.1*  --  18.3*  LATICACIDVEN  --   --   --  1.6  --     Microbiology No results found for this or any previous visit (from the past 240 hour(s)).    Medications:    amiodarone  200 mg Oral Daily   atorvastatin  40 mg Oral q1800   docusate sodium  100 mg Oral BID   gabapentin  300 mg Oral QHS   insulin aspart  0-15 Units Subcutaneous TID WC   midodrine  10 mg Oral TID WC   multivitamin with minerals  1 tablet Oral Daily   nutrition supplement (JUVEN)  1 packet Oral BID BM   pantoprazole  40 mg Oral Daily   Ensure Max Protein  11 oz Oral QHS   sodium chloride flush  3 mL Intravenous Q12H   Continuous Infusions:  sodium chloride Stopped (04/19/21 8446)   sodium chloride     phytonadione (VITAMIN K) IV        LOS: 11 days   Charlynne Cousins  [facility]  04/19/2021, 6:55 AM

## 2021-04-19 NOTE — Progress Notes (Signed)
Hal Hope MD given update that pt has not had much urine output. Bladder scanned for only 3ms in bladder. On exam, pt's pad was wet, changed and placed condom cath for more accurate I/Os. NS rate increased to 711mhr. Pt in no distress. Will continue to monitor.  StJaymes GraffRN

## 2021-04-19 NOTE — Consult Note (Signed)
Renal Service Consult Note [facility]  Ryan [name] 04/19/2021 Ryan Blazing, MD Requesting Physician: Dr. Aileen Fass  Reason for Consult: renal failure  HPI: The patient is a 75 y.o. year-old w/ hx afib, MVR on coumadin, chronic syst CHF w/ ICD, HTN, PAD sp peripheral bypass admitted on 7/20 for foot infection/ I&D and possible BKA. Surgery postponed due to high INR 3.2. then underwent partial amputation w/ I&D and wound VAC on 7/21. Had RLE angiogram on 7/25. Had 5 day course of abx.  Then pt had retroperitoneal bleed / hematoma and ABL anemia. Now has worsening renal failure.  Admit creat was 1.5 , then recheck on 7/30 was 4.6 uup to 5.1 this am. UOP marginal at 50 cc yesterday. Asked to see for renal failure.   Pt seen in room, pt is sleeping and arouses easily, mostly oriented, wife at bedside. He denies any SOB or cough or swelling.   ROS - denies CP, no joint pain, no HA, no blurry vision, no rash, no diarrhea, no nausea/ vomiting, no dysuria, no difficulty voiding   Past Medical History  Past Medical History:  Diagnosis Date   AICD (automatic cardioverter/defibrillator) present    Arthritis    Atrial fibrillation (HCC)    Cardiomyopathy, dilated (HCC)    CHF (congestive heart failure) ([place])    Diabetes mellitus without complication ([place])    Endocarditis    Hypertension    Peripheral vascular disease (HCC)    PVC's (premature ventricular contractions)    SVT (supraventricular tachycardia)  long RP    Past Surgical History  Past Surgical History:  Procedure Laterality Date   ABDOMINAL AORTAGRAM N/A 10/03/2012   Procedure: ABDOMINAL Maxcine Ham;  Surgeon: [name], MD;  Location: [facility] CATH LAB;  Service: Cardiovascular;  Laterality: N/A;   ABDOMINAL AORTOGRAM W/LOWER EXTREMITY N/A 02/25/2021   Procedure: ABDOMINAL AORTOGRAM W/LOWER EXTREMITY;  Surgeon: Cherre Robins, MD;  Location: [place] CV LAB;  Service: Cardiovascular;  Laterality: N/A;    ABDOMINAL AORTOGRAM W/LOWER EXTREMITY N/A 04/13/2021   Procedure: ABDOMINAL AORTOGRAM W/LOWER EXTREMITY;  Surgeon: Waynetta Sandy, MD;  Location: [place] CV LAB;  Service: Cardiovascular;  Laterality: N/A;   APPLICATION OF WOUND VAC Right 04/09/2021   Procedure: APPLICATION OF WOUND VAC;  Surgeon: Cherre Robins, MD;  Location: [place];  Service: Vascular;  Laterality: Right;   BYPASS GRAFT FEMORAL-PERONEAL Right 03/06/2021   Procedure: RIGHT ABOVE KNEE POPLITEAL ARTERY-PERONEAL BYPASS;  Surgeon: Cherre Robins, MD;  Location: [place];  Service: Vascular;  Laterality: Right;   CORONARY ANGIOGRAM  09/21/2012   Procedure: CORONARY ANGIOGRAM;  Surgeon: [name], MD;  Location: [facility] CATH LAB;  Service: Cardiovascular;;   EP IMPLANTABLE DEVICE N/A 06/11/2016   Procedure: ICD Implant;  Surgeon: Will Meredith Leeds, MD;  Location: [place] CV LAB;  Service: Cardiovascular;  Laterality: N/A;   EXTREMITY WIRE/PIN REMOVAL  09/14/2012   Procedure: REMOVAL K-WIRE/PIN EXTREMITY;  Surgeon: [name], MD;  Location: [place];  Service: Orthopedics;  Laterality: Right;  Right Foot   I & D EXTREMITY  09/14/2012   Procedure: IRRIGATION AND DEBRIDEMENT EXTREMITY;  Surgeon: Tennis Must, MD;  Location: [place];  Service: Orthopedics;  Laterality: Right;   INTRAOPERATIVE TRANSESOPHAGEAL ECHOCARDIOGRAM  09/26/2012   Procedure: INTRAOPERATIVE TRANSESOPHAGEAL ECHOCARDIOGRAM;  Surgeon: [name], MD;  Location: [facility] OR;  Service: Open Heart Surgery;  Laterality: N/A;   MITRAL VALVE REPLACEMENT  09/26/2012   Procedure:  MITRAL VALVE (MV) REPLACEMENT;  Surgeon: [name], MD;  Location: [place] OR;  Service: Open Heart Surgery;  Laterality: N/A;   RIGHT HEART CATH N/A 08/29/2020   Procedure: RIGHT HEART CATH;  Surgeon: Larey Dresser, MD;  Location: [place] CV LAB;  Service: Cardiovascular;  Laterality: N/A;   RIGHT HEART CATHETERIZATION  09/21/2012   Procedure: RIGHT HEART CATH;  Surgeon: [name], MD;  Location: [facility] CATH LAB;  Service: Cardiovascular;;   RIGHT/LEFT HEART CATH AND CORONARY ANGIOGRAPHY N/A 01/30/2021   Procedure: RIGHT/LEFT HEART CATH AND CORONARY ANGIOGRAPHY;  Surgeon: Larey Dresser, MD;  Location: [place] CV LAB;  Service: Cardiovascular;  Laterality: N/A;   SVT ABLATION N/A 09/03/2020   Procedure: SVT ABLATION;  Surgeon: Constance Haw, MD;  Location: [place] CV LAB;  Service: Cardiovascular;  Laterality: N/A;   TEE WITHOUT CARDIOVERSION  09/18/2012   Procedure: TRANSESOPHAGEAL ECHOCARDIOGRAM (TEE);  Surgeon: [name], MD;  Location: [facility] ENDOSCOPY;  Service: Cardiovascular;  Laterality: N/A;   WOUND DEBRIDEMENT Right 04/09/2021   Procedure: DEBRIDEMENT RIGHT HEEL WOUND AND PARTIAL FIRST TOE AMPUTATION AND SECOND TOE AMPUTATION. Application of Myriad skin substitute;  Surgeon: Cherre Robins, MD;  Location[facility] OR;  Service: Vascular;  Laterality: Right;   Family History  Family History  Problem Relation Age of Onset   Hypertension Mother    Hypertension Father    Social History  reports that he has never smoked. He has never used smokeless tobacco. He reports that he does not drink alcohol and does not use drugs. Allergies  Allergies  Allergen Reactions   Tuna [Fish Allergy] Nausea And Vomiting   Home medications Prior to Admission medications   Medication Sig Start Date End Date Taking? Authorizing Provider  acetaminophen (TYLENOL) 325 MG tablet Take 650 mg by mouth every 6 (six) hours as needed for mild pain or headache.   Yes [provider]  amiodarone (PACERONE) 200 MG tablet TAKE 1 TABLET BY MOUTH EVERY DAY Patient taking differently: Take 200 mg by mouth daily. 03/03/21  Yes Bhagat, Bhavinkumar, PA  atorvastatin (LIPITOR) 40 MG tablet Take 1 tablet (40 mg total) by mouth daily at 6 PM. 10/06/12  Yes Timmothy Euler, MD  bisacodyl (DULCOLAX) 5 MG EC tablet Take 5 mg by mouth daily as needed for moderate constipation.    Yes [provider]  docusate sodium (COLACE) 100 MG capsule Take 100 mg by mouth daily.   Yes [provider]  empagliflozin (JARDIANCE) 10 MG TABS tablet Take 1 tablet (10 mg total) by mouth daily. 11/11/20  Yes Larey Dresser, MD  feeding supplement (ENSURE ENLIVE / ENSURE PLUS) LIQD Take 237 mLs by mouth 2 (two) times daily between meals. 03/12/21  Yes Rhyne, Hulen Shouts, PA-C  furosemide (LASIX) 20 MG tablet Take 2 tablets (40 mg total) by mouth daily for 3 days, THEN 2 tablets (40 mg total) as needed for edema. 04/03/21 04/06/22 Yes Milford, Maricela Bo, FNP  gabapentin (NEURONTIN) 300 MG capsule Take 300 mg by mouth at bedtime.   Yes [provider]  guaiFENesin (ROBITUSSIN) 100 MG/5ML SOLN Take 5 mLs by mouth every 4 (four) hours as needed for cough or to loosen phlegm.   Yes [provider]  HYDROcodone-acetaminophen (NORCO/VICODIN) 5-325 MG tablet Take 1 tablet by mouth 2 (two) times daily as needed for moderate pain. 03/12/21  Yes Rhyne, Hulen Shouts, PA-C  Multiple Vitamins-Minerals (CENTRUM SILVER PO) Take 1 tablet by  mouth 3 (three) times a week.   Yes [provider]  mupirocin ointment (BACTROBAN) 2 % Apply 1 application topically 2 (two) times daily.   Yes [provider]  pantoprazole (PROTONIX) 40 MG tablet Take 40 mg by mouth daily.  11/12/14  Yes [provider]  triamcinolone cream (KENALOG) 0.5 % Apply 1 application topically daily as needed (skin rash).  02/15/14  Yes [provider]  warfarin (COUMADIN) 7.5 MG tablet Take 7.5 mg by mouth daily.   Yes [provider]  potassium chloride SA (KLOR-CON) 10 MEQ tablet Take 1 tablet (10 mEq total) by mouth daily for 3 days. 04/03/21 04/06/21  Rafael Bihari, FNP     Vitals:   04/19/21 0400 04/19/21 0500 04/19/21 0610 04/19/21 0727  BP: (!) 117/54 (!) 104/56 (!) 117/57 (!) 122/56  Pulse:    84  Resp:    18  Temp:    98.4 F (36.9 C)  TempSrc:    Oral   SpO2: 100% 100% 100% 100%  Weight:      Height:       Exam Gen alert, no distress No rash, cyanosis or gangrene Sclera anicteric, throat clear  No jvd or bruits Chest clear bilat to bases, no rales/ wheezing RRR no MRG Abd soft ntnd no mass or ascites +bs GU normal MS no joint effusions or deformity Ext no LE or UE edema, wound VAC R lower leg , R foot wrapped Neuro is alert, Ox 2.5, no asterixis     Home meds include amiodarone, lipitor, jardiance, ensure, lasix 20 qd, neurontin, vicodin prn, protonix, coumadin, Kdur, prn's   UA 7/20 - 11-20 rbc, >50 wbc, neg protein, rare bact   UPC ratio June 2022 = 0.24      Date   Creat   eGFR        2013- 2014  0.7- 1.04    2016- 2020  1.30- 1.75    2021   1.73- 2.36    Jan - June 2022 1.50- 2.37  32- 48, IIIB    July 15  1.47     July 20- 31  1.60 > 1.14 > 5.12      UNa, UCr pending     Repeat UA pend       CT abd 7/30 - IMPRESSION:There is a large (up to 17 cm) left retroperitoneal hematoma. Additionally there is expansion of the left iliacus muscle most compatible with intramuscular hemorrhage. The large hematoma displaces the left kidney cranially. There is new moderate left hydronephrosis, likely secondary to mass effect on the left ureter.      UOP good until 7/29 dropped to 100 cc then 50 cc yest    BP's were very low in the 80's on 7/29 then improved to > 100's on 7/30      Wt's up 5Kg , I/O up 2-3L       Na 132  K 5.6  CO2 20  BUN 59  Cr 5.12  Hb 7.5         Assessment/ Plan: AKI on CKD IIIB - b/l creat 1.5- 2.3 from early 2022.  AKI here in setting of acute RP bleed w/ hypotensive period 2 days ago, and also L sided uretero-hydronephrosis may be contributing. Not making much urine, foley in place. Will start IVF's at 65 cc/hr. BP's are now normal again. Would recommend urology input if not already done for obstruction. He prob has early uremia (more lethargic the last  few days).  May require HD in the next 24-48 hrs. Will  follow.  Acute L RP hematoma RLE limb ischemia - sp I&D Hyperkalemia - changed to renal diet, added lokelma bid Chronic syst CHF/ sp ICD Atrial fib/ MVR - a/c's on hold due to #2 HTN - BP lowering meds on hold d/t acute hypotensive event. BP's wnl now.  Volume - no vol excess on exam, starting gentle IVF's at 65/ hr      Kelly Splinter  MD 04/19/2021, 8:01 AM  Recent Labs  Lab 04/18/21 2019 04/19/21 0150  WBC 19.1* 18.3*  HGB 7.0* 7.5*   Recent Labs  Lab 04/18/21 2207 04/19/21 0150 04/19/21 0559  K 6.1* 6.0* 5.6*  BUN 51* 54* 59*  CREATININE 4.64* 4.82* 5.12*  CALCIUM 8.1* 8.6* 8.2*  PHOS 5.9*  --   --

## 2021-04-20 ENCOUNTER — Inpatient Hospital Stay (HOSPITAL_COMMUNITY): Payer: Medicare HMO

## 2021-04-20 DIAGNOSIS — I70229 Atherosclerosis of native arteries of extremities with rest pain, unspecified extremity: Secondary | ICD-10-CM | POA: Diagnosis not present

## 2021-04-20 HISTORY — PX: IR US GUIDE VASC ACCESS RIGHT: IMG2390

## 2021-04-20 HISTORY — PX: IR NEPHROSTOMY PLACEMENT LEFT: IMG6063

## 2021-04-20 HISTORY — PX: IR FLUORO GUIDE CV LINE RIGHT: IMG2283

## 2021-04-20 LAB — PREPARE FRESH FROZEN PLASMA
Unit division: 0
Unit division: 0

## 2021-04-20 LAB — CBC
HCT: 18.6 % — ABNORMAL LOW (ref 39.0–52.0)
Hemoglobin: 5.8 g/dL — CL (ref 13.0–17.0)
MCH: 24.9 pg — ABNORMAL LOW (ref 26.0–34.0)
MCHC: 31.2 g/dL (ref 30.0–36.0)
MCV: 79.8 fL — ABNORMAL LOW (ref 80.0–100.0)
Platelets: 251 K/uL (ref 150–400)
RBC: 2.33 MIL/uL — ABNORMAL LOW (ref 4.22–5.81)
RDW: 18.6 % — ABNORMAL HIGH (ref 11.5–15.5)
WBC: 13.9 K/uL — ABNORMAL HIGH (ref 4.0–10.5)
nRBC: 0.3 % — ABNORMAL HIGH (ref 0.0–0.2)

## 2021-04-20 LAB — BASIC METABOLIC PANEL
Anion gap: 11 (ref 5–15)
BUN: 74 mg/dL — ABNORMAL HIGH (ref 8–23)
CO2: 22 mmol/L (ref 22–32)
Calcium: 8.5 mg/dL — ABNORMAL LOW (ref 8.9–10.3)
Chloride: 101 mmol/L (ref 98–111)
Creatinine, Ser: 5.63 mg/dL — ABNORMAL HIGH (ref 0.61–1.24)
GFR, Estimated: 10 mL/min — ABNORMAL LOW (ref >60)
Glucose, Bld: 119 mg/dL — ABNORMAL HIGH (ref 70–99)
Potassium: 5.5 mmol/L — ABNORMAL HIGH (ref 3.5–5.1)
Sodium: 134 mmol/L — ABNORMAL LOW (ref 135–145)

## 2021-04-20 LAB — URINALYSIS, ROUTINE W REFLEX MICROSCOPIC
Glucose, UA: NEGATIVE mg/dL
Ketones, ur: NEGATIVE mg/dL
Nitrite: NEGATIVE
Protein, ur: 30 mg/dL — AB
Specific Gravity, Urine: 1.023 (ref 1.005–1.030)
WBC, UA: >50 WBC/hpf — ABNORMAL HIGH (ref 0–5)
pH: 5 (ref 5.0–8.0)

## 2021-04-20 LAB — BPAM FFP
Blood Product Expiration Date: 202208042359
Blood Product Expiration Date: 202208042359
Blood Product Expiration Date: 202208042359
ISSUE DATE / TIME: 202207311143
ISSUE DATE / TIME: 202207311556
ISSUE DATE / TIME: 202207300643
Unit Type and Rh: 6200
Unit Type and Rh: 6200
Unit Type and Rh: 6200

## 2021-04-20 LAB — HEMOGLOBIN AND HEMATOCRIT, BLOOD
HCT: 22.9 % — ABNORMAL LOW (ref 39.0–52.0)
Hemoglobin: 7.1 g/dL — ABNORMAL LOW (ref 13.0–17.0)

## 2021-04-20 LAB — PREPARE RBC (CROSSMATCH)

## 2021-04-20 LAB — GLUCOSE, CAPILLARY
Glucose-Capillary: 119 mg/dL — ABNORMAL HIGH (ref 70–99)
Glucose-Capillary: 114 mg/dL — ABNORMAL HIGH (ref 70–99)
Glucose-Capillary: 115 mg/dL — ABNORMAL HIGH (ref 70–99)
Glucose-Capillary: 122 mg/dL — ABNORMAL HIGH (ref 70–99)

## 2021-04-20 LAB — CREATININE, URINE, RANDOM: Creatinine, Urine: 197.02 mg/dL

## 2021-04-20 LAB — SODIUM, URINE, RANDOM: Sodium, Ur: 28 mmol/L

## 2021-04-20 LAB — PROTIME-INR
INR: 1.3 — ABNORMAL HIGH (ref 0.8–1.2)
Prothrombin Time: 15.9 s — ABNORMAL HIGH (ref 11.4–15.2)

## 2021-04-20 LAB — CK: Total CK: 2716 U/L — ABNORMAL HIGH (ref 49–397)

## 2021-04-20 MED ORDER — MIDAZOLAM HCL 2 MG/2ML IJ SOLN
INTRAMUSCULAR | Status: AC
  Filled 2021-04-20: qty 2

## 2021-04-20 MED ORDER — CHLORHEXIDINE GLUCONATE CLOTH 2 % EX PADS: 6.0000 | MEDICATED_PAD | Freq: Every day | CUTANEOUS | Status: DC

## 2021-04-20 MED ORDER — LIDOCAINE HCL 1 % IJ SOLN
INTRAMUSCULAR | Status: AC
  Filled 2021-04-20: qty 20

## 2021-04-20 MED ORDER — PROSOURCE PLUS PO LIQD
30.0000 mL | Freq: Two times a day (BID) | ORAL | Status: DC
  Administered 2021-04-20 – 2021-04-29 (×15): 30 mL via ORAL
  Filled 2021-04-20 (×17): qty 30

## 2021-04-20 MED ORDER — SODIUM CHLORIDE 0.9 % IV SOLN
1.0000 g | INTRAVENOUS | Status: DC
  Administered 2021-04-21 – 2021-04-27 (×7): 1 g via INTRAVENOUS
  Filled 2021-04-20 (×3): qty 1
  Filled 2021-04-20: qty 10
  Filled 2021-04-20 (×2): qty 1
  Filled 2021-04-20: qty 10

## 2021-04-20 MED ORDER — NEPRO/CARBSTEADY PO LIQD
237.0000 mL | Freq: Two times a day (BID) | ORAL | Status: DC
  Administered 2021-04-20 – 2021-04-23 (×5): 237 mL via ORAL

## 2021-04-20 MED ORDER — B COMPLEX-C PO TABS
1.0000 | ORAL_TABLET | Freq: Every day | ORAL | Status: DC
  Administered 2021-04-20 – 2021-04-29 (×10): 1 via ORAL
  Filled 2021-04-20 (×10): qty 1

## 2021-04-20 MED ORDER — IOHEXOL 300 MG/ML  SOLN
50.0000 mL | Freq: Once | INTRAMUSCULAR | Status: AC | PRN
  Administered 2021-04-20: 10 mL

## 2021-04-20 MED ORDER — CHLORHEXIDINE GLUCONATE CLOTH 2 % EX PADS
6.0000 | MEDICATED_PAD | Freq: Every day | CUTANEOUS | Status: DC
  Administered 2021-04-21 – 2021-04-24 (×4): 6 via TOPICAL

## 2021-04-20 MED ORDER — FENTANYL CITRATE (PF) 100 MCG/2ML IJ SOLN
INTRAMUSCULAR | Status: AC
  Filled 2021-04-20: qty 2

## 2021-04-20 MED ORDER — FENTANYL CITRATE (PF) 100 MCG/2ML IJ SOLN
INTRAMUSCULAR | Status: AC | PRN
  Administered 2021-04-20 (×2): 25 ug via INTRAVENOUS

## 2021-04-20 MED ORDER — HEPARIN SODIUM (PORCINE) 1000 UNIT/ML IJ SOLN
INTRAMUSCULAR | Status: AC
  Filled 2021-04-20: qty 1

## 2021-04-20 MED ORDER — SODIUM CHLORIDE 0.9 % IV SOLN
2.0000 g | INTRAVENOUS | Status: AC
  Administered 2021-04-20: 2 g via INTRAVENOUS
  Filled 2021-04-20: qty 20

## 2021-04-20 MED ORDER — SODIUM CHLORIDE 0.9% IV SOLUTION: Freq: Once | INTRAVENOUS | Status: AC

## 2021-04-20 MED ORDER — SODIUM CHLORIDE 0.9% IV SOLUTION: Freq: Once | INTRAVENOUS | Status: DC

## 2021-04-20 MED ORDER — LIDOCAINE HCL 1 % IJ SOLN
INTRAMUSCULAR | Status: AC | PRN
  Administered 2021-04-20: 10 mL

## 2021-04-20 NOTE — Progress Notes (Signed)
[facility] PROGRESS NOTE    Progress Note  Ryan [name]  HEN:[id number] DOB: [birth year] DOA: 04/08/2021 PCP: [name], MD     Brief Narrative:   Ryan [name] is an 75 y.o. 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  Scheduled to have debridement on 04/08/2021 but due to an INR of 3.2 surgery was postponed.  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  He has completed his course of antibiotics in house last day was 04/13/2019   Assessment/Plan:   Right critical lower limb ischemia [facility]): Surgery rec continue dressing changes and wound care.   Wound VAC to be changed by wound care nurse. He also recommended to keep in placement for 2 weeks postprocedure ([id number]) follow-up with them as an outpatient. He will be discharged with wound VAC to skilled nursing facility.  New retroperitoneal hematoma/acute blood loss anemia: CT scan of the abdomen and pelvis showed new retroperitoneal hematoma he was given vitamin K and fresh frozen plasma, as his INR 1.3 . Vascular surgery is on board and agree with current management. Hemoglobin overnight dropped to 5.8, he is scheduled to get 2 units of packed red blood cells. Check a CBC posttransfusional.  ATN on chronic kidney disease stage III: In the setting of hypotension, retroperitoneal bleed causing mechanical displacement of the left kidney and anemia. Despite conservative management renal function continues to deteriorate. Renal was consulted as well as urology they both recommended percutaneous left nephrostomy tube placement for left  hydronephrosis and renal replacement therapy Is to be anuric with only 50 cc. Both urology and nephrology and I agree that is better to proceed with a percutaneous nephrostomy tube placement and HD catheter for dialysis today Foley IR can eventually convert him to an antegrade stent.  Junctional rhythm due to hyperkalemia: Likely due to intra-abdominal bleeding, he was given calcium, insulin and dextrose as well with Lokelma and his potassium is slowly coming down.  Chronic systolic heart failure (HCC) Hold Lasix.  Cont.midodrine. Has a follow-up appointment with the advance heart failure team to rule out multiple myeloma question amyloid as an outpatient.  Permanent atrial fibrillation/Status post mitral valve replacement with bioprosthetic valve: Bioprosthetic valve. Hold heparin and Coumadin.  Diabetes mellitus type 2: With an A1c of 5.4 continue sliding scale insulin, will control blood glucose.  Essential HTN: Cont.midodrine, continue amiodarone blood pressure is elevated this morning. We will continue to monitor closely.  Blood pressure currently be elevated due to pain.  Hyperlipidemia: Continue statins.   DVT prophylaxis: Hold IV heparin and Coumadin, His anticoagulation has been reversed. Family Communication: None Status is: Inpatient  Remains inpatient appropriate because:Hemodynamically unstable  Dispo: The patient is from: Home              Anticipated d/c is to: Home              Patient currently is not medically stable to d/c.   Difficult to place patient No    Code Status:     Code Status Orders  (From admission, onward)           Start     Ordered   04/08/21 1211  Full code  Continuous  04/08/21 1212           Code Status History     Date Active Date Inactive Code Status Order ID Comments User Context   03/06/2021 1816 03/13/2021 0325 Full Code [id number]  Ortencia Kick Inpatient   02/25/2021 1202 02/25/2021 2000 Full Code  [id number]  Cherre Robins, MD Inpatient   01/30/2021 1513 01/30/2021 2320 Full Code [id number]  Larey Dresser, MD Inpatient   08/26/2020 1818 09/06/2020 2203 Full Code [id number]  Ledora Bottcher, [place] Inpatient   06/11/2016 1627 06/12/2016 1608 Full Code [id number]  Constance Haw, MD Inpatient   08/30/2015 0114 08/30/2015 1540 Full Code [id number]  Edwin Dada, MD Inpatient   09/29/2012 1803 10/06/2012 2104 Full Code [id number]  Cephus Richer, RN Inpatient   09/26/2012 1331 09/29/2012 1803 Full Code [id number]  Lenox Ahr, RN Inpatient         IV Access:   Peripheral IV   Procedures and diagnostic studies:   CT ABDOMEN PELVIS WO CONTRAST  Result Date: 04/18/2021 CLINICAL DATA:  Patient with anemia.  On anticoagulation. EXAM: CT ABDOMEN AND PELVIS WITHOUT CONTRAST TECHNIQUE: Multidetector CT imaging of the abdomen and pelvis was performed following the standard protocol without IV contrast. COMPARISON:  Renal ultrasound 03/09/2021 FINDINGS: Lower chest: Patchy ground-glass and consolidative opacities within the lower lobes bilaterally most compatible with atelectasis. Trace bilateral pleural effusions. Hepatobiliary: Liver is normal in size and contour. Stones and sludge within the gallbladder lumen. No intrahepatic or extrahepatic biliary ductal dilatation. Pancreas: Unremarkable Spleen: Unremarkable Adrenals/Urinary Tract: Normal adrenal glands. The left kidney is displaced cranially. There is moderate left hydronephrosis. Urinary bladder is unremarkable. Stomach/Bowel: No abnormal bowel wall thickening or evidence for bowel obstruction. No free intraperitoneal air. Vascular/Lymphatic: Normal caliber abdominal aorta. No retroperitoneal lymphadenopathy. Reproductive: Unremarkable. Other: There is a large 17 x 17 x 7 cm left retroperitoneal fluid collection with layering high density favored to represent blood products. Musculoskeletal: Mild expansion of the left iliacus  muscle. Right hip joint degenerative changes. Lower thoracic and lumbar spine degenerative changes. There is mild height loss of the superior endplate of the L5 and L3 vertebral bodies. IMPRESSION: There is a large (up to 17 cm) left retroperitoneal hematoma. Additionally there is expansion of the left iliacus muscle most compatible with intramuscular hemorrhage. The large hematoma displaces the left kidney cranially. There is new moderate left hydronephrosis, likely secondary to mass effect on the left ureter. Age-indeterminate superior endplate compression deformities of the L5 and L3 vertebral bodies. Critical Value/emergent results were discussed at the time of interpretation on 04/18/2021 at 1:47 pm to provider [facility] , who verbally acknowledged these results. Electronically Signed   By: Lovey Newcomer M.D.   On: 04/18/2021 13:52   DG CHEST PORT 1 VIEW  Result Date: 04/19/2021 CLINICAL DATA:  75 year old male with shortness of breath. EXAM: PORTABLE CHEST 1 VIEW COMPARISON:  Portable chest 09/01/2020 and earlier. FINDINGS: Portable AP semi upright view at 0523 hours. Chronic cardiomegaly appears stable. Visualized tracheal air column is within normal limits. Chronic left chest AICD, cardiac valve replacement and sternotomy. Substantially lower lung volumes. No pneumothorax. Upper lung pulmonary vascularity appears normal. Patchy bibasilar opacity. Small pleural effusion(s) difficult to exclude. No air bronchograms. Negative visible bowel gas pattern. Stable visualized osseous structures. IMPRESSION: 1. Substantially lower lung volumes with bibasilar opacity, nonspecific but perhaps just atelectasis. Difficult to exclude small pleural effusions. 2. Cardiomegaly.  No acute pulmonary edema. Electronically Signed  By: Genevie Ann M.D.   On: 04/19/2021 08:06   CT RENAL STONE STUDY  Result Date: 04/19/2021 CLINICAL DATA:  Acute renal failure.  Decreased urine output. EXAM: CT ABDOMEN AND PELVIS WITHOUT  CONTRAST TECHNIQUE: Multidetector CT imaging of the abdomen and pelvis was performed following the standard protocol without IV contrast. COMPARISON:  04/18/2021 FINDINGS: Lower chest: Subpleural atelectasis and consolidation noted within both lung bases. Cardiac enlargement. ICD leads are noted within the right ventricle and left atrial appendage. Hepatobiliary: No focal liver abnormality identified. Small stones are noted layering within the gallbladder. No gallbladder wall thickening or pericholecystic inflammatory fat stranding. No bile duct dilatation. Pancreas: No acute findings. Spleen: Normal in size without focal abnormality. Adrenals/Urinary Tract: Normal adrenal glands. Right kidney is unremarkable. Left-sided hydronephrosis and hydroureter identified. I suspect this is secondary to mass effect from left retroperitoneal hematoma. Urinary bladder is grossly unremarkable. Stomach/Bowel: Stomach appears normal. There are a few mildly increased loops of small bowel which measure up to 2.7 cm. No specific findings to suggest bowel obstruction with gas and stool noted throughout the colon up to the level of the rectum. Vascular/Lymphatic: Aortic atherosclerosis. No aneurysm. No abdominopelvic adenopathy. Reproductive: Prostate is unremarkable. Other: Large left retroperitoneal hematoma is again identified. Using the same measuring technique as the previous exam this measures 16.5 x 7.8 by 16.8 cm, image 53/6 and image 57/3. Layering high attenuation material is identified consistent with subacute to acute blood products. On the previous exam this measured 18.2 x 8.5 by 17.3 cm. Intramuscular hematoma involving the left iliopsoas muscle is stable from previous exam. Small amount of mixed attenuation fluid infiltration within the presacral soft tissue space appears similar. Musculoskeletal: There is multilevel superior endplate compression deformities within the lumbar spine which appear unchanged from the  previous exam and remain indeterminate. Multilevel degenerative disc disease also noted. IMPRESSION: 1. Similar appearance of large left retroperitoneal hematoma. 2. Left-sided hydronephrosis and hydroureter identified. I suspect this is secondary to mass effect from left retroperitoneal hematoma. 3. Similar appearance of intramuscular hematoma involving the left iliopsoas muscle. 4. Aortic atherosclerosis. 5. Gallstones. Aortic Atherosclerosis (ICD10-I70.0). Electronically Signed   By: Kerby Moors M.D.   On: 04/19/2021 07:00     Medical Consultants:   None.   Subjective:    Ryan [name] relates his pain is controlled has had 1 bowel movement.  Objective:    Vitals:   04/20/21 0645 04/20/21 0728 04/20/21 0833 04/20/21 0852  BP: 114/66 108/67 (!) 116/53 (!) 119/51  Pulse: 77 79 79 80  Resp: $Remo'18 20 19 20  'dXjeG$ Temp: 98.3 F (36.8 C) 98.1 F (36.7 C) 98.4 F (36.9 C) 98.4 F (36.9 C)  TempSrc: Axillary Oral Oral Oral  SpO2: 99% 97% 97% 97%  Weight:      Height:       SpO2: 97 % O2 Flow Rate (L/min): 2 L/min   Intake/Output Summary (Last 24 hours) at 04/20/2021 0938 Last data filed at 04/20/2021 0700 Gross per 24 hour  Intake 3234.31 ml  Output 55 ml  Net 3179.31 ml    Filed Weights   04/18/21 0359 04/19/21 0309 04/20/21 0613  Weight: 78.3 kg 80.3 kg 84.8 kg    Exam: General exam: In no acute distress. Respiratory system: Good air movement and clear to auscultation. Cardiovascular system: S1 & S2 heard, RRR. No JVD. Gastrointestinal system: Abdomen is nondistended, soft and nontender.  Extremities: No pedal edema. Skin: No rashes, lesions or ulcers  Data Reviewed:  Labs: Basic Metabolic Panel: Recent Labs  Lab 04/18/21 2019 04/18/21 2207 04/19/21 0150 04/19/21 0559 04/20/21 0158  NA 134* 135 134* 132* 134*  K 6.4* 6.1* 6.0* 5.6* 5.5*  CL 101 102 100 100 101  CO2 22 22 21* 20* 22  GLUCOSE 143* 137* 115* 122* 119*  BUN 52* 51* 54* 59* 74*  CREATININE  4.66* 4.64* 4.82* 5.12* 5.63*  CALCIUM 8.4* 8.1* 8.6* 8.2* 8.5*  PHOS  --  5.9*  --   --   --     GFR Estimated Creatinine Clearance: 12.1 mL/min (A) (by C-G formula based on SCr of 5.63 mg/dL (H)). Liver Function Tests: No results for input(s): AST, ALT, ALKPHOS, BILITOT, PROT, ALBUMIN in the last 168 hours. No results for input(s): LIPASE, AMYLASE in the last 168 hours. No results for input(s): AMMONIA in the last 168 hours. Coagulation profile Recent Labs  Lab 04/18/21 0106 04/18/21 2207 04/19/21 0150 04/19/21 1548 04/20/21 0158  INR 2.3* 2.0* 2.1* 1.5* 1.3*    COVID-19 Labs  No results for input(s): DDIMER, FERRITIN, LDH, CRP in the last 72 hours.  Lab Results  Component Value Date   SARSCOV2NAA NEGATIVE 04/08/2021   SARSCOV2NAA NEGATIVE 03/03/2021   SARSCOV2NAA NEGATIVE 01/28/2021   [place] NEGATIVE 08/26/2020    CBC: Recent Labs  Lab 04/17/21 0115 04/18/21 0106 04/18/21 2019 04/19/21 0150 04/20/21 0158  WBC 9.7 14.7* 19.1* 18.3* 13.9*  HGB 7.9* 6.4* 7.0* 7.5* 5.8*  HCT 26.6* 20.8* 22.2* 23.7* 18.6*  MCV 80.1 78.2* 78.4* 79.8* 79.8*  PLT 280 300 275 214 251    Cardiac Enzymes: Recent Labs  Lab 04/18/21 2207 04/20/21 0158  CKTOTAL 3,479* 2,716*    BNP (last 3 results) Recent Labs    08/25/20 1121 03/27/21 1123  PROBNP 21,395* 3,146*    CBG: Recent Labs  Lab 04/19/21 0649 04/19/21 1100 04/19/21 1630 04/19/21 2223 04/20/21 0614  GLUCAP 121* 129* 118* 123* 119*    D-Dimer: No results for input(s): DDIMER in the last 72 hours. Hgb A1c: No results for input(s): HGBA1C in the last 72 hours. Lipid Profile: No results for input(s): CHOL, HDL, LDLCALC, TRIG, CHOLHDL, LDLDIRECT in the last 72 hours. Thyroid function studies: No results for input(s): TSH, T4TOTAL, T3FREE, THYROIDAB in the last 72 hours.  Invalid input(s): FREET3 Anemia work up: No results for input(s): VITAMINB12, FOLATE, FERRITIN, TIBC, IRON, RETICCTPCT in the last  72 hours. Sepsis Labs: Recent Labs  Lab 04/18/21 0106 04/18/21 2019 04/18/21 2207 04/19/21 0150 04/20/21 0158  WBC 14.7* 19.1*  --  18.3* 13.9*  LATICACIDVEN  --   --  1.6  --   --     Microbiology No results found for this or any previous visit (from the past 240 hour(s)).    Medications:    sodium chloride   Intravenous Once   sodium chloride   Intravenous Once   amiodarone  200 mg Oral Daily   atorvastatin  40 mg Oral q1800   docusate sodium  100 mg Oral BID   insulin aspart  0-15 Units Subcutaneous TID WC   midodrine  10 mg Oral TID WC   multivitamin with minerals  1 tablet Oral Daily   nutrition supplement (JUVEN)  1 packet Oral BID BM   pantoprazole  40 mg Oral Daily   Ensure Max Protein  11 oz Oral QHS   sodium chloride flush  3 mL Intravenous Q12H   sodium zirconium cyclosilicate  10 g Oral BID   Continuous  Infusions:  sodium chloride 65 mL/hr at 04/20/21 0857      LOS: 12 days   [facility]  04/20/2021, 9:38 AM

## 2021-04-20 NOTE — Progress Notes (Signed)
Critical Hgb 5.8. Dr. Aileen Fass, on-call for attending, text-paged. Page promptly returned and order received for 2 Units PRBCs to be transfused.

## 2021-04-20 NOTE — Consult Note (Signed)
Chief Complaint: Patient was seen in consultation today for non tunneled dialysis catheter placement and left percutaneous nephrostomy placement at the request of Keene Breath MD  Supervising Physician: Corrie Mckusick  Patient Status: [facility] - In-pt  History of Present Illness: Ryan [name] is a 75 y.o. male   On coumadin for Afib and MVR CHF/AICD; HTN; PVD Ongoing wound infection  Post 1) right first toe partial toe amputation 2) right second toe amputation 3) right heel sharp excisional debridement Wound vac placed to heel wound 6 Days Post-Op 04/13/21  Poss BKA was postponed secondary high INR New Retroperitoneal hematoma discovered on CT 7/30: IMPRESSION: There is a large (up to 17 cm) left retroperitoneal hematoma. Additionally there is expansion of the left iliacus muscle most compatible with intramuscular hemorrhage. The large hematoma displaces the left kidney cranially. There is new moderate left hydronephrosis, likely secondary to mass effect on the left ureter.  Now noted acute renal injury Rising Creatinine 4.6--4.8--5.1--5.6 Nephrology requesting non tunneled dialysis catheter placement  Dr Jeffie Pollock note 04/20/21: Urology has seen pt and feels Left percutaneous nephrostomy placement is appropriate Left hydronephrosis from a large left retroperitoneal hematoma with associated AKI and anuria that is probably predominantly from ATN from a hypotensive episode with the bleed. I would recommend proceeding with dialysis if planned.   I think that decompression of the left kidney will make much difference in the renal function at this time, but if decompression is desired, a nephrostomy tube would be a safer option at this time since his INR is down to 1.3 and he is a poor candidate for a general anesthetic that would be required for a stent.  Scheduled now for non tunneled dialysis catheter placement and L percutaneous nephrostomy placement  Dr Earleen Newport has reviewed images  and approves procedure  INR 1.3 today Hd 5.8 today -- transfusing now   Past Medical History:  Diagnosis Date   AICD (automatic cardioverter/defibrillator) present    Arthritis    Atrial fibrillation (HCC)    Cardiomyopathy, dilated (HCC)    CHF (congestive heart failure) ([place])    Diabetes mellitus without complication ([place])    Endocarditis    Hypertension    Peripheral vascular disease ([place])    PVC's (premature ventricular contractions)    SVT (supraventricular tachycardia)  long RP     Past Surgical History:  Procedure Laterality Date   ABDOMINAL AORTAGRAM N/A 10/03/2012   Procedure: ABDOMINAL Maxcine Ham;  Surgeon: [name], MD;  Location: [facility] CATH LAB;  Service: Cardiovascular;  Laterality: N/A;   ABDOMINAL AORTOGRAM W/LOWER EXTREMITY N/A 02/25/2021   Procedure: ABDOMINAL AORTOGRAM W/LOWER EXTREMITY;  Surgeon: Cherre Robins, MD;  Location: [place] CV LAB;  Service: Cardiovascular;  Laterality: N/A;   ABDOMINAL AORTOGRAM W/LOWER EXTREMITY N/A 04/13/2021   Procedure: ABDOMINAL AORTOGRAM W/LOWER EXTREMITY;  Surgeon: Waynetta Sandy, MD;  Location: [place] CV LAB;  Service: Cardiovascular;  Laterality: N/A;   APPLICATION OF WOUND VAC Right 04/09/2021   Procedure: APPLICATION OF WOUND VAC;  Surgeon: Cherre Robins, MD;  Location: [place];  Service: Vascular;  Laterality: Right;   BYPASS GRAFT FEMORAL-PERONEAL Right 03/06/2021   Procedure: RIGHT ABOVE KNEE POPLITEAL ARTERY-PERONEAL BYPASS;  Surgeon: Cherre Robins, MD;  Location: [place];  Service: Vascular;  Laterality: Right;   CORONARY ANGIOGRAM  09/21/2012   Procedure: CORONARY ANGIOGRAM;  Surgeon: [name], MD;  Location: [facility] CATH LAB;  Service: Cardiovascular;;   EP IMPLANTABLE DEVICE N/A 06/11/2016  Procedure: ICD Implant;  Surgeon: Will Meredith Leeds, MD;  Location: [place] CV LAB;  Service: Cardiovascular;  Laterality: N/A;   EXTREMITY WIRE/PIN REMOVAL  09/14/2012   Procedure: REMOVAL  K-WIRE/PIN EXTREMITY;  Surgeon: [name], MD;  Location: [place];  Service: Orthopedics;  Laterality: Right;  Right Foot   I & D EXTREMITY  09/14/2012   Procedure: IRRIGATION AND DEBRIDEMENT EXTREMITY;  Surgeon: Tennis Must, MD;  Location: [place];  Service: Orthopedics;  Laterality: Right;   INTRAOPERATIVE TRANSESOPHAGEAL ECHOCARDIOGRAM  09/26/2012   Procedure: INTRAOPERATIVE TRANSESOPHAGEAL ECHOCARDIOGRAM;  Surgeon: [name], MD;  Location: [facility] OR;  Service: Open Heart Surgery;  Laterality: N/A;   MITRAL VALVE REPLACEMENT  09/26/2012   Procedure: MITRAL VALVE (MV) REPLACEMENT;  Surgeon: [name], MD;  Location: [place] OR;  Service: Open Heart Surgery;  Laterality: N/A;   RIGHT HEART CATH N/A 08/29/2020   Procedure: RIGHT HEART CATH;  Surgeon: Larey Dresser, MD;  Location: [place] CV LAB;  Service: Cardiovascular;  Laterality: N/A;   RIGHT HEART CATHETERIZATION  09/21/2012   Procedure: RIGHT HEART CATH;  Surgeon: [name], MD;  Location: [facility] CATH LAB;  Service: Cardiovascular;;   RIGHT/LEFT HEART CATH AND CORONARY ANGIOGRAPHY N/A 01/30/2021   Procedure: RIGHT/LEFT HEART CATH AND CORONARY ANGIOGRAPHY;  Surgeon: Larey Dresser, MD;  Location: [place] CV LAB;  Service: Cardiovascular;  Laterality: N/A;   SVT ABLATION N/A 09/03/2020   Procedure: SVT ABLATION;  Surgeon: Constance Haw, MD;  Location: [place] CV LAB;  Service: Cardiovascular;  Laterality: N/A;   TEE WITHOUT CARDIOVERSION  09/18/2012   Procedure: TRANSESOPHAGEAL ECHOCARDIOGRAM (TEE);  Surgeon: [name], MD;  Location: [facility] ENDOSCOPY;  Service: Cardiovascular;  Laterality: N/A;   WOUND DEBRIDEMENT Right 04/09/2021   Procedure: DEBRIDEMENT RIGHT HEEL WOUND AND PARTIAL FIRST TOE AMPUTATION AND SECOND TOE AMPUTATION. Application of Myriad skin substitute;  Surgeon: Cherre Robins, MD;  Location: [place];  Service: Vascular;  Laterality: Right;    Allergies: [name] allergy]  Medications: Prior to  Admission medications   Medication Sig Start Date End Date Taking? Authorizing Provider  acetaminophen (TYLENOL) 325 MG tablet Take 650 mg by mouth every 6 (six) hours as needed for mild pain or headache.   Yes [provider]  amiodarone (PACERONE) 200 MG tablet TAKE 1 TABLET BY MOUTH EVERY DAY Patient taking differently: Take 200 mg by mouth daily. 03/03/21  Yes Bhagat, Bhavinkumar, PA  atorvastatin (LIPITOR) 40 MG tablet Take 1 tablet (40 mg total) by mouth daily at 6 PM. 10/06/12  Yes Timmothy Euler, MD  bisacodyl (DULCOLAX) 5 MG EC tablet Take 5 mg by mouth daily as needed for moderate constipation.   Yes [provider]  docusate sodium (COLACE) 100 MG capsule Take 100 mg by mouth daily.   Yes [provider]  empagliflozin (JARDIANCE) 10 MG TABS tablet Take 1 tablet (10 mg total) by mouth daily. 11/11/20  Yes Larey Dresser, MD  feeding supplement (ENSURE ENLIVE / ENSURE PLUS) LIQD Take 237 mLs by mouth 2 (two) times daily between meals. 03/12/21  Yes Rhyne, Hulen Shouts, PA-C  furosemide (LASIX) 20 MG tablet Take 2 tablets (40 mg total) by mouth daily for 3 days, THEN 2 tablets (40 mg total) as needed for edema. 04/03/21 04/06/22 Yes Milford, Maricela Bo, FNP  gabapentin (NEURONTIN) 300 MG capsule Take 300 mg by mouth at bedtime.   Yes [provider]  guaiFENesin (ROBITUSSIN)  100 MG/5ML SOLN Take 5 mLs by mouth every 4 (four) hours as needed for cough or to loosen phlegm.   Yes [provider]  HYDROcodone-acetaminophen (NORCO/VICODIN) 5-325 MG tablet Take 1 tablet by mouth 2 (two) times daily as needed for moderate pain. 03/12/21  Yes Rhyne, Hulen Shouts, PA-C  Multiple Vitamins-Minerals (CENTRUM SILVER PO) Take 1 tablet by mouth 3 (three) times a week.   Yes [provider]  mupirocin ointment (BACTROBAN) 2 % Apply 1 application topically 2 (two) times daily.   Yes [provider]  pantoprazole (PROTONIX) 40 MG tablet Take 40 mg by  mouth daily.  11/12/14  Yes [provider]  triamcinolone cream (KENALOG) 0.5 % Apply 1 application topically daily as needed (skin rash).  02/15/14  Yes [provider]  warfarin (COUMADIN) 7.5 MG tablet Take 7.5 mg by mouth daily.   Yes [provider]  potassium chloride SA (KLOR-CON) 10 MEQ tablet Take 1 tablet (10 mEq total) by mouth daily for 3 days. 04/03/21 04/06/21  Rafael Bihari, FNP     Family History  Problem Relation Age of Onset   Hypertension Mother    Hypertension Father     Social History   Socioeconomic History   Marital status: Divorced    Spouse name: Not on file   Number of children: 3   Years of education: Not on file   Highest education level: Not on file  Occupational History   Not on file  Tobacco Use   Smoking status: Never   Smokeless tobacco: Never  Vaping Use   Vaping Use: Never used  Substance and Sexual Activity   Alcohol use: No    Alcohol/week: 0.0 standard drinks   Drug use: No   Sexual activity: Not on file  Other Topics Concern   Not on file  Social History Narrative   Daughter lives with him.  Retired Advertising account planner   Social Determinants of Radio broadcast assistant Strain: Not on [employer] Insecurity: No Food Insecurity   Worried About Charity fundraiser in the Last Year: Never true   Arboriculturist in the Last Year: Never true  Transportation Needs: No Data processing manager (Medical): No   Lack of Transportation (Non-Medical): No  Physical Activity: Not on file  Stress: Not on file  Social Connections: Not on file    Review of Systems: A 12 point ROS discussed and pertinent positives are indicated in the HPI above.  All other systems are negative.  Review of Systems  Constitutional:  Positive for activity change. Negative for fever and unexpected weight change.  Respiratory:  Negative for cough and shortness of breath.   Gastrointestinal:  Positive for nausea.  Negative for vomiting.  Neurological:  Positive for weakness.  Psychiatric/Behavioral:  Positive for decreased concentration. Negative for behavioral problems.    Vital Signs: BP (!) 119/51   Pulse 80   Temp 98.4 F (36.9 C) (Oral)   Resp 20   Ht 6' (1.829 m)   Wt 186 lb 15.2 oz (84.8 kg)   SpO2 97%   BMI 25.35 kg/m   Physical Exam Vitals reviewed.  Constitutional:      Comments: Groggy Falls asleep during conversation  HENT:     Mouth/Throat:     Mouth: Mucous membranes are moist.  Cardiovascular:     Rate and Rhythm: Normal rate and regular rhythm.     Heart sounds: Normal heart  sounds.  Pulmonary:     Effort: Pulmonary effort is normal.     Breath sounds: Normal breath sounds. No wheezing.  Abdominal:     Tenderness: There is no abdominal tenderness.  Skin:    General: Skin is warm.  Neurological:     Mental Status: Mental status is at baseline.  Psychiatric:     Comments: Spoke to Dtr in law Ryan [name] via phone about procedure (Care giver)    Imaging: CT ABDOMEN PELVIS WO CONTRAST  Result Date: 04/18/2021 CLINICAL DATA:  Patient with anemia.  On anticoagulation. EXAM: CT ABDOMEN AND PELVIS WITHOUT CONTRAST TECHNIQUE: Multidetector CT imaging of the abdomen and pelvis was performed following the standard protocol without IV contrast. COMPARISON:  Renal ultrasound 03/09/2021 FINDINGS: Lower chest: Patchy ground-glass and consolidative opacities within the lower lobes bilaterally most compatible with atelectasis. Trace bilateral pleural effusions. Hepatobiliary: Liver is normal in size and contour. Stones and sludge within the gallbladder lumen. No intrahepatic or extrahepatic biliary ductal dilatation. Pancreas: Unremarkable Spleen: Unremarkable Adrenals/Urinary Tract: Normal adrenal glands. The left kidney is displaced cranially. There is moderate left hydronephrosis. Urinary bladder is unremarkable. Stomach/Bowel: No abnormal bowel wall thickening or evidence for  bowel obstruction. No free intraperitoneal air. Vascular/Lymphatic: Normal caliber abdominal aorta. No retroperitoneal lymphadenopathy. Reproductive: Unremarkable. Other: There is a large 17 x 17 x 7 cm left retroperitoneal fluid collection with layering high density favored to represent blood products. Musculoskeletal: Mild expansion of the left iliacus muscle. Right hip joint degenerative changes. Lower thoracic and lumbar spine degenerative changes. There is mild height loss of the superior endplate of the L5 and L3 vertebral bodies. IMPRESSION: There is a large (up to 17 cm) left retroperitoneal hematoma. Additionally there is expansion of the left iliacus muscle most compatible with intramuscular hemorrhage. The large hematoma displaces the left kidney cranially. There is new moderate left hydronephrosis, likely secondary to mass effect on the left ureter. Age-indeterminate superior endplate compression deformities of the L5 and L3 vertebral bodies. Critical Value/emergent results were discussed at the time of interpretation on 04/18/2021 at 1:47 pm to provider [facility] , who verbally acknowledged these results. Electronically Signed   By: Lovey Newcomer M.D.   On: 04/18/2021 13:52   DG Lumbar Spine 2-3 Views  Result Date: 04/16/2021 CLINICAL DATA:  Lower back pain EXAM: LUMBAR SPINE - 2-3 VIEW COMPARISON:  None. FINDINGS: There are five non-rib bearing lumbar-type vertebral bodies. There is normal alignment. There is mild age indeterminate wedging of L5 and T11. Moderate intervertebral disc space height loss of L4-5 and L5-S1. Mild intervertebral disc space height loss of L1-2 and L2-3. Multilevel endplate proliferative changes and facet arthropathy. Endplate sclerosis of L5, likely degenerative. Atherosclerotic calcifications. Diffuse gaseous distension of loops of bowel with air visualized in the rectum. IMPRESSION: 1. Mild age-indeterminate wedging of L5 and T11. Recommend correlation with point  tenderness. 2. Moderate multilevel degenerative changes. 3. Atherosclerotic calcifications. Electronically Signed   By: Valentino Saxon MD   On: 04/16/2021 13:20   PERIPHERAL VASCULAR CATHETERIZATION  Result Date: 04/13/2021 Images from the original result were not included. Patient name: Ryan [name] MRN: [id number] DOB: November 23, 1945 Sex: male 04/13/2021 Pre-operative Diagnosis: Critical left lower extremity ischemia with left great toe ulceration Post-operative diagnosis:  Same Surgeon:  Erlene Quan C. Donzetta Matters, MD Procedure Performed: 1.  Ultrasound-guided cannulation right common femoral artery 2.  Selection of left common femoral artery and left popliteal artery and left lower extremity angiography 3.  Moderate sedation with  fentanyl and Versed for 40  minutes 4.  Mynx device closure right common femoral artery Indications: 76 year old male recent underwent bypass on the right to his peroneal artery and subsequent heel debridement.  He now has small ulceration on the left great toe with toe pressure of 30 known peroneal artery runoff is indicated for angiography with possible intervention. Findings: Aorta and iliac segments were not again reevaluated with common femoral pulses that are palpable, recent angiography and known chronic kidney disease.  The left common femoral artery, SFA were patent although heavily calcified.  The above-knee popliteal artery has approximately 30% stenosis which is nonflow limiting.  Below the knee he has runoff dominant via the peroneal artery but this ends at the ankle.  The posterior tibial artery is initially patent but occludes in the mid calf and does not reconstitute.  There is reconstitution of calcaneal branch distally on the foot.  The anterior tibial artery occludes after several centimeters it is occluded for at least 20 cm and reconstitutes at a very diseased distal anterior tibial artery and runs onto the foot is a very diseased dorsalis pedis artery.  There was no  indication for intervention of these vessels patient will need maximal medical therapy.  Procedure:  The patient was identified in the holding area and taken to room 8.  The patient was then placed supine on the table and prepped and draped in the usual sterile fashion.  A time out was called.  Ultrasound was used to evaluate the right common femoral artery this was a large vessel noted to be patent and compressible.  There is no spasm present Liken cannulated micropuncture needle followed by wire and sheath using direct ultrasound access.  Images saved to the permanent record.  We placed a Bentson wire followed by 5 [place] sheath.  We directly crossed the bifurcation with Omni catheter and Bentson wire placed in the left common femoral artery.  Perform left lower extremity angiography.  With the above findings we used a Glidewire to traverse the SFA down to the above-knee popliteal artery placed a straight catheter performed angled angiography of the left foot.  There was no role for intervention.  Catheter was removed.  Make device was deployed.  He tolerated seizure without any complication Contrast: 45 cc Brandon C. Donzetta Matters, MD [facility] Office: [phone] Pager: [phone]   DG CHEST PORT 1 VIEW  Result Date: 04/19/2021 CLINICAL DATA:  75 year old male with shortness of breath. EXAM: PORTABLE CHEST 1 VIEW COMPARISON:  Portable chest 09/01/2020 and earlier. FINDINGS: Portable AP semi upright view at 0523 hours. Chronic cardiomegaly appears stable. Visualized tracheal air column is within normal limits. Chronic left chest AICD, cardiac valve replacement and sternotomy. Substantially lower lung volumes. No pneumothorax. Upper lung pulmonary vascularity appears normal. Patchy bibasilar opacity. Small pleural effusion(s) difficult to exclude. No air bronchograms. Negative visible bowel gas pattern. Stable visualized osseous structures. IMPRESSION: 1. Substantially lower lung  volumes with bibasilar opacity, nonspecific but perhaps just atelectasis. Difficult to exclude small pleural effusions. 2. Cardiomegaly.  No acute pulmonary edema. Electronically Signed   By: Genevie Ann M.D.   On: 04/19/2021 08:06   VAS [place] ABI WITH/WO TBI  Result Date: 04/07/2021  LOWER EXTREMITY DOPPLER STUDY Patient Name:  Ryan [name]  Date of Exam:   04/07/2021 Medical Rec #: [id number]         Accession #:    [id number] Date of Birth: January 14, 1946  Patient Gender: M Patient Age:   40Y [facility]am [facility] Procedure:      VAS [place] ABI WITH/WO TBI Referring Phys: [id number] [name] HAWKEN --------------------------------------------------------------------------------  Indications: Ulceration, gangrene right great toe, and peripheral artery              disease. High Risk Factors: Hypertension, Diabetes, no history of smoking. Other Factors: Atrial Fibrillation, CHF, SVT.  Vascular Interventions: Right above knee popliteal to peroneal artery bypass                         graft 03/06/2021. Performing Technologist: Alvia Grove RVT  Examination Guidelines: A complete evaluation includes at minimum, Doppler waveform signals and systolic blood pressure reading at the level of bilateral brachial, anterior tibial, and posterior tibial arteries, when vessel segments are accessible. Bilateral testing is considered an integral part of a complete examination. Photoelectric Plethysmograph (PPG) waveforms and toe systolic pressure readings are included as required and additional duplex testing as needed. Limited examinations for reoccurring indications may be performed as noted.  ABI Findings: +--------+------------------+-----+----------+--------+ Right   Rt Pressure (mmHg)IndexWaveform  Comment  +--------+------------------+-----+----------+--------+ [id number]                                       +--------+------------------+-----+----------+--------+ PTA     97                 0.73 biphasic           +--------+------------------+-----+----------+--------+ PERO    >254              1.92 monophasic         +--------+------------------+-----+----------+--------+ DP      164               1.24 monophasic         +--------+------------------+-----+----------+--------+ +---------+------------------+-----+----------+-------+ Left     Lt Pressure (mmHg)IndexWaveform  Comment +---------+------------------+-----+----------+-------+ Brachial 126                                      +---------+------------------+-----+----------+-------+ PTA      >254              1.92 biphasic          +---------+------------------+-----+----------+-------+ DP       >254              1.92 monophasic        +---------+------------------+-----+----------+-------+ Great Toe30                0.23 Abnormal          +---------+------------------+-----+----------+-------+ +-------+-----------+-----------+------------+------------+ ABI/TBIToday's ABIToday's TBIPrevious ABIPrevious TBI +-------+-----------+-----------+------------+------------+ Right  [place]         gangrene   254                      +-------+-----------+-----------+------------+------------+ Left   254        0.23                                +-------+-----------+-----------+------------+------------+   Summary: Right: Resting right ankle-brachial index indicates noncompressible right lower extremity arteries, unable to obtain toe-brachial index due to gangrene great toe. . Left: Resting left ankle-brachial index  indicates noncompressible left lower extremity arteries. The left toe-brachial index is abnormal.  *See table(s) above for measurements and observations.  Electronically signed by Monica Martinez MD on 04/07/2021 at 4:22:07 PM.    Final    CT RENAL STONE STUDY  Result Date: 04/19/2021 CLINICAL DATA:  Acute renal failure.  Decreased urine output. EXAM: CT ABDOMEN AND PELVIS WITHOUT  CONTRAST TECHNIQUE: Multidetector CT imaging of the abdomen and pelvis was performed following the standard protocol without IV contrast. COMPARISON:  04/18/2021 FINDINGS: Lower chest: Subpleural atelectasis and consolidation noted within both lung bases. Cardiac enlargement. ICD leads are noted within the right ventricle and left atrial appendage. Hepatobiliary: No focal liver abnormality identified. Small stones are noted layering within the gallbladder. No gallbladder wall thickening or pericholecystic inflammatory fat stranding. No bile duct dilatation. Pancreas: No acute findings. Spleen: Normal in size without focal abnormality. Adrenals/Urinary Tract: Normal adrenal glands. Right kidney is unremarkable. Left-sided hydronephrosis and hydroureter identified. I suspect this is secondary to mass effect from left retroperitoneal hematoma. Urinary bladder is grossly unremarkable. Stomach/Bowel: Stomach appears normal. There are a few mildly increased loops of small bowel which measure up to 2.7 cm. No specific findings to suggest bowel obstruction with gas and stool noted throughout the colon up to the level of the rectum. Vascular/Lymphatic: Aortic atherosclerosis. No aneurysm. No abdominopelvic adenopathy. Reproductive: Prostate is unremarkable. Other: Large left retroperitoneal hematoma is again identified. Using the same measuring technique as the previous exam this measures 16.5 x 7.8 by 16.8 cm, image 53/6 and image 57/3. Layering high attenuation material is identified consistent with subacute to acute blood products. On the previous exam this measured 18.2 x 8.5 by 17.3 cm. Intramuscular hematoma involving the left iliopsoas muscle is stable from previous exam. Small amount of mixed attenuation fluid infiltration within the presacral soft tissue space appears similar. Musculoskeletal: There is multilevel superior endplate compression deformities within the lumbar spine which appear unchanged from the  previous exam and remain indeterminate. Multilevel degenerative disc disease also noted. IMPRESSION: 1. Similar appearance of large left retroperitoneal hematoma. 2. Left-sided hydronephrosis and hydroureter identified. I suspect this is secondary to mass effect from left retroperitoneal hematoma. 3. Similar appearance of intramuscular hematoma involving the left iliopsoas muscle. 4. Aortic atherosclerosis. 5. Gallstones. Aortic Atherosclerosis (ICD10-I70.0). Electronically Signed   By: Kerby Moors M.D.   On: 04/19/2021 07:00   CUP PACEART INCLINIC DEVICE CHECK  Result Date: 03/27/2021 ICD check in clinic. Normal device function. Thresholds and sensing consistent with previous device measurements. Impedance trends stable over time. No mode switches. No ventricular arrhythmias. Histogram distribution appropriate for patient and level of  activity. No changes made this session. Device programmed at appropriate safety margins. Estimated longevity 4 y, 4 mo. Pt enrolled in remote follow-up. Patient education completed. Thoracic impendence has been elevated, but out of proportion to exam, and in discordance with recent HF examinations as well.  VAS [place] LOWER EXTREMITY ARTERIAL DUPLEX  Result Date: 04/07/2021 LOWER EXTREMITY ARTERIAL DUPLEX STUDY Patient Name:  Ryan [name]  Date of Exam:   04/07/2021 Medical Rec #: [id number]         Accession #:    [id number] Date of Birth: 04/16/1946         Patient Gender: M Patient Age:   75Y Exam Location:  [facility] Procedure:      VAS [place] LOWER EXTREMITY ARTERIAL DUPLEX Referring Phys: [id number] [place] --------------------------------------------------------------------------------  Indications: Ulceration, and peripheral artery disease. High Risk  Factors: Hypertension, Diabetes.  Vascular Interventions: Right above knee to below knee bypass graft 03/06/2021. Current ABI:            Right ABI [place], Left [place] Performing Technologist: Alvia Grove RVT   Examination Guidelines: A complete evaluation includes B-mode imaging, spectral Doppler, color Doppler, and power Doppler as needed of all accessible portions of each vessel. Bilateral testing is considered an integral part of a complete examination. Limited examinations for reoccurring indications may be performed as noted.  +----------+--------+-----+--------+---------+-------------------------+ RIGHT     PSV cm/sRatioStenosisWaveform Comments                  +----------+--------+-----+--------+---------+-------------------------+ CFA Prox  72                   triphasic                          +----------+--------+-----+--------+---------+-------------------------+ CFA Distal46                   triphasic                          +----------+--------+-----+--------+---------+-------------------------+ DFA       33                   biphasic                           +----------+--------+-----+--------+---------+-------------------------+ SFA Prox  59                   biphasic                           +----------+--------+-----+--------+---------+-------------------------+ SFA Mid   77                   triphasicdampened                  +----------+--------+-----+--------+---------+-------------------------+ SFA Distal104                  triphasicdampened , proximal graft +----------+--------+-----+--------+---------+-------------------------+  Right Graft #1: +------------------+--------+--------+----------+--------+                   PSV cm/sStenosisWaveform  Comments +------------------+--------+--------+----------+--------+ Inflow            112             monophasic         +------------------+--------+--------+----------+--------+ Prox Anastomosis  60              monophasic         +------------------+--------+--------+----------+--------+ Proximal Graft    148             monophasicbrisk     +------------------+--------+--------+----------+--------+ Mid Graft         88              monophasicbrisk    +------------------+--------+--------+----------+--------+ Distal Graft      51              monophasic         +------------------+--------+--------+----------+--------+ Distal Anastomosis73              monophasic         +------------------+--------+--------+----------+--------+ Outflow           73  monophasic         +------------------+--------+--------+----------+--------+   Summary: Right: Patent above knee to below knee bypass graft with no visualized stenosis.  See table(s) above for measurements and observations. Electronically signed by Monica Martinez MD on 04/07/2021 at 3:40:41 PM.    Final     Labs:  CBC: Recent Labs    04/18/21 0106 04/18/21 2019 04/19/21 0150 04/20/21 0158  WBC 14.7* 19.1* 18.3* 13.9*  HGB 6.4* 7.0* 7.5* 5.8*  HCT 20.8* 22.2* 23.7* 18.6*  PLT 300 275 214 251    COAGS: Recent Labs    03/09/21 0241 03/09/21 1556 03/10/21 0111 03/11/21 0107 03/12/21 0123 04/08/21 0826 04/08/21 2044 04/18/21 2207 04/19/21 0150 04/19/21 1548 04/20/21 0158  INR  --    < > 1.3* 1.3*   < > 3.2*   < > 2.0* 2.1* 1.5* 1.3*  APTT 68*  --  93* 80*  --  47*  --   --   --   --   --    < > = values in this interval not displayed.    BMP: Recent Labs    08/25/20 1121 08/25/20 1419 09/22/20 1446 10/01/20 1157 10/14/20 1403 04/18/21 2207 04/19/21 0150 04/19/21 0559 04/20/21 0158  NA 144   < > 138 140   < > 135 134* 132* 134*  K 4.8   < > 5.1 4.6   < > 6.1* 6.0* 5.6* 5.5*  CL 113*   < > 103 106   < > 102 100 100 101  CO2 18*   < > 18* 22   < > 22 21* 20* 22  GLUCOSE 132*   < > 106* 98   < > 137* 115* 122* 119*  BUN 30*   < > 42* 37*   < > 51* 54* 59* 74*  CALCIUM 8.8   < > 9.6 9.3   < > 8.1* 8.6* 8.2* 8.5*  CREATININE 1.76*   < > 2.20* 1.88*   < > 4.64* 4.82* 5.12* 5.63*  GFRNONAA 37*   < > 28* 34*   < > 12* 12* 11*  10*  GFRAA 42*  --  32* 39*  --   --   --   --   --    < > = values in this interval not displayed.    LIVER FUNCTION TESTS: Recent Labs    01/22/21 1208 03/03/21 1600 03/10/21 0111 03/11/21 0107 03/12/21 0123 04/08/21 0826  BILITOT 0.5 1.0  --   --   --  0.6  AST 35 55*  --   --   --  22  ALT 43 46*  --   --   --  18  ALKPHOS 65 90  --   --   --  123  PROT 6.2* 6.3*  --   --   --  6.6  ALBUMIN 2.9* 3.4* 2.2* 2.2* 2.1* 3.2*    TUMOR MARKERS: No results for input(s): AFPTM, CEA, CA199, CHROMGRNA in the last 8760 hours.  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 Vascular surgery of ongoing infection of foot -- BKA postponed secondary high INR Large retroperitoneal hematoma noted on imaging Left kidney displaced and compromised Rising Creatinine and hydronephrosis Scheduled now for non tunneled- temporary dialysis catheter per Nephrology And Left percutaneous nephrostomy placement per Urology Risks and benefits of left PCN placement was discussed with the patient and Dtr in law Ryan [name] via phone (care giver) including, but  not limited to, infection, bleeding, significant bleeding causing loss or decrease in renal function or damage to adjacent structures.   Also discussed with care giver procedure of non tunneled dialysis catheter placement. She is aware of procedure benefits and risks Agreeable to proceed Consent signed  All of the patient's and family's questions were answered, patient is agreeable to proceed.    Thank you for this interesting consult.  I greatly enjoyed meeting Ryan [name] and look forward to participating in their care.  A copy of this report was sent to the requesting provider on this date.  Electronically Signed: Lavonia Drafts, PA-C 04/20/2021, 10:35 AM   I spent a total of 40 Minutes    in face to face in clinical consultation, greater than 50% of which was counseling/coordinating care for L PCN and temporary dialysis  catheter placement

## 2021-04-20 NOTE — Consult Note (Signed)
[place] Nurse wound follow up Wound type: surgical debridement/ right heel Measurement: 4cm x 3cm x 0.1cm : per Op note; not able to get accurate measurement due to the presence of graft and non adherent  Wound bed: unable to assess Drainage (amount, consistency, odor) none Periwound:macerated Dressing procedure/placement/frequency: Removed old NPWT dressing; leaving xeroform and Myriad skin substitute in place Skin protected to the pretibial region with VAC drape Bridge used to black foam to cover wound bed, using 1/2 of barrier ring to aid in seal Seal at 161mmHG Secured with ACE wrap; area on the dorsal foot appears to be deep tissue pressure injury (protected with 2x2 gauze) but may be related to perfusion. Not clear if this has been present. Surgical sites a the right toes CDI.  Followed by [facility] post op.  [place] nurse to follow along for M/W/F dressing changes to right heel.  Heel elevated on pillows for offloading.   [place], [place], [place]

## 2021-04-20 NOTE — Consult Note (Signed)
Subjective: CC: Left hydronephrosis with AKI   Consult requested by Dr. Charlynne Cousins.   Ryan [name] is a 75 yo male who I was asked to see for left hydronephrosis secondary to a large retroperitoneal hematoma follow anticoagulation with an endovascular procedure.  He has progressive AKI with anuria with an otherwise normal appearing right kidney and a baseline Cr of about 1.1.   He is lethargic this morning but is arousable and was able to answer questions.  His only complaint is left upper abdominal pain with coughing.   He has no prior GU history.  He has a foley in place.  ROS:  Review of Systems  Unable to perform ROS: Mental acuity   Allergies  Allergen Reactions   Ryan [name] [Fish Allergy] Nausea And Vomiting    Past Medical History:  Diagnosis Date   AICD (automatic cardioverter/defibrillator) present    Arthritis    Atrial fibrillation (HCC)    Cardiomyopathy, dilated (HCC)    CHF (congestive heart failure) ([place])    Diabetes mellitus without complication ([place])    Endocarditis    Hypertension    Peripheral vascular disease (HCC)    PVC's (premature ventricular contractions)    SVT (supraventricular tachycardia)  long RP     Past Surgical History:  Procedure Laterality Date   ABDOMINAL AORTAGRAM N/A 10/03/2012   Procedure: ABDOMINAL Maxcine Ham;  Surgeon: [name], MD;  Location: [facility] CATH LAB;  Service: Cardiovascular;  Laterality: N/A;   ABDOMINAL AORTOGRAM W/LOWER EXTREMITY N/A 02/25/2021   Procedure: ABDOMINAL AORTOGRAM W/LOWER EXTREMITY;  Surgeon: Cherre Robins, MD;  Location: [place] CV LAB;  Service: Cardiovascular;  Laterality: N/A;   ABDOMINAL AORTOGRAM W/LOWER EXTREMITY N/A 04/13/2021   Procedure: ABDOMINAL AORTOGRAM W/LOWER EXTREMITY;  Surgeon: Waynetta Sandy, MD;  Location: [place] CV LAB;  Service: Cardiovascular;  Laterality: N/A;   APPLICATION OF WOUND VAC Right 04/09/2021   Procedure: APPLICATION OF WOUND VAC;  Surgeon: Cherre Robins, MD;  Location: [place];  Service: Vascular;  Laterality: Right;   BYPASS GRAFT FEMORAL-PERONEAL Right 03/06/2021   Procedure: RIGHT ABOVE KNEE POPLITEAL ARTERY-PERONEAL BYPASS;  Surgeon: Cherre Robins, MD;  Location: [place];  Service: Vascular;  Laterality: Right;   CORONARY ANGIOGRAM  09/21/2012   Procedure: CORONARY ANGIOGRAM;  Surgeon: [name], MD;  Location: [facility] C[facility] LAB;  Service: Cardiovascular;;   EP IMPLANTABLE DEVICE N/A 06/11/2016   Procedure: ICD Implant;  Surgeon: Will Meredith Leeds, MD;  Location: [place] CV LAB;  Service: Cardiovascular;  Laterality: N/A;   EXTREMITY WIRE/PIN REMOVAL  09/14/2012   Procedure: REMOVAL K-WIRE/PIN EXTREMITY;  Surgeon: [name], MD;  Location: [place];  Service: Orthopedics;  Laterality: Right;  Right Foot   I & D EXTREMITY  09/14/2012   Procedure: IRRIGATION AND DEBRIDEMENT EXTREMITY;  Surgeon: Tennis Must, MD;  Location: [place];  Service: Orthopedics;  Laterality: Right;   INTRAOPERATIVE TRANSESOPHAGEAL ECHOCARDIOGRAM  09/26/2012   Procedure: INTRAOPERATIVE TRANSESOPHAGEAL ECHOCARDIOGRAM;  Surgeon: [name], MD;  Location: [facility] OR;  Service: Open Heart Surgery;  Laterality: N/A;   MITRAL VALVE REPLACEMENT  09/26/2012   Procedure: MITRAL VALVE (MV) REPLACEMENT;  Surgeon: [name], MD;  Location: [place] OR;  Service: Open Heart Surgery;  Laterality: N/A;   RIGHT HEART CATH N/A 08/29/2020   Procedure: RIGHT HEART CATH;  Surgeon: Larey Dresser, MD;  Location: [place] CV LAB;  Service: Cardiovascular;  Laterality: N/A;   RIGHT HEART CATHETERIZATION  09/21/2012   Procedure: RIGHT HEART CATH;  Surgeon: [name], MD;  Location: [facility] CATH LAB;  Service: Cardiovascular;;   RIGHT/LEFT HEART CATH AND CORONARY ANGIOGRAPHY N/A 01/30/2021   Procedure: RIGHT/LEFT HEART CATH AND CORONARY ANGIOGRAPHY;  Surgeon: Larey Dresser, MD;  Location: [place] CV LAB;  Service: Cardiovascular;  Laterality: N/A;   SVT ABLATION N/A  09/03/2020   Procedure: SVT ABLATION;  Surgeon: Constance Haw, MD;  Location: [place] CV LAB;  Service: Cardiovascular;  Laterality: N/A;   TEE WITHOUT CARDIOVERSION  09/18/2012   Procedure: TRANSESOPHAGEAL ECHOCARDIOGRAM (TEE);  Surgeon: [name], MD;  Location: [facility] ENDOSCOPY;  Service: Cardiovascular;  Laterality: N/A;   WOUND DEBRIDEMENT Right 04/09/2021   Procedure: DEBRIDEMENT RIGHT HEEL WOUND AND PARTIAL FIRST TOE AMPUTATION AND SECOND TOE AMPUTATION. Application of Myriad skin substitute;  Surgeon: Cherre Robins, MD;  Location: [facility] OR;  Service: Vascular;  Laterality: Right;    Social History   Socioeconomic History   Marital status: Divorced    Spouse name: Not on file   Number of children: 3   Years of education: Not on file   Highest education level: Not on file  Occupational History   Not on file  Tobacco Use   Smoking status: Never   Smokeless tobacco: Never  Vaping Use   Vaping Use: Never used  Substance and Sexual Activity   Alcohol use: No    Alcohol/week: 0.0 standard drinks   Drug use: No   Sexual activity: Not on file  Other Topics Concern   Not on file  Social History Narrative   Daughter lives with him.  Retired Advertising account planner   Social Determinants of Radio broadcast assistant Strain: Not on [employer] Insecurity: No Food Insecurity   Worried About Charity fundraiser in the Last Year: Never true   Arboriculturist in the Last Year: Never true  Transportation Needs: No Data processing manager (Medical): No   Lack of Transportation (Non-Medical): No  Physical Activity: Not on file  Stress: Not on file  Social Connections: Not on file  Intimate Partner Violence: Not on file    Family History  Problem Relation Age of Onset   Hypertension Mother    Hypertension Father     Anti-infectives: Anti-infectives (From admission, onward)    Start     Dose/Rate Route Frequency Ordered Stop   04/08/21 2000   cefTRIAXone (ROCEPHIN) 2 g in sodium chloride 0.9 % 100 mL IVPB  Status:  Discontinued       See Hyperspace for full Linked Orders Report.   2 g 200 mL/hr over 30 Minutes Intravenous Every 24 hours 04/08/21 1212 04/13/21 1134   04/08/21 2000  metroNIDAZOLE (FLAGYL) IVPB 500 mg  Status:  Discontinued       See Hyperspace for full Linked Orders Report.   500 mg 100 mL/hr over 60 Minutes Intravenous Every 8 hours 04/08/21 1212 04/13/21 1134   04/08/21 0823  ceFAZolin (ANCEF) IVPB 2g/100 mL premix  Status:  Discontinued        2 g 200 mL/hr over 30 Minutes Intravenous 30 min pre-op 04/08/21 6734 04/08/21 1212       Current Facility-Administered Medications  Medication Dose Route Frequency Provider Last Rate Last Admin   0.9 %  sodium chloride infusion (Manually program via Guardrails IV Fluids)   Intravenous Once [place], Archie Patten N, DO       0.9 %  sodium chloride infusion (Manually program via Guardrails IV Fluids)   Intravenous Once Aileen Fass, Tammi Klippel, MD       0.9 %  sodium chloride infusion   Intravenous Continuous Roney Jaffe, MD 65 mL/hr at 04/20/21 0645 Restarted at 04/20/21 0645   alum & mag hydroxide-simeth (MAALOX/MYLANTA) 200-200-20 MG/5ML suspension 30 mL  30 mL Oral Q4H PRN Charlynne Cousins, MD   30 mL at 04/17/21 0144   amiodarone (PACERONE) tablet 200 mg  200 mg Oral Daily Waynetta Sandy, MD   200 mg at 04/19/21 0850   atorvastatin (LIPITOR) tablet 40 mg  40 mg Oral q1800 Waynetta Sandy, MD   40 mg at 04/19/21 1803   bisacodyl (DULCOLAX) EC tablet 5 mg  5 mg Oral Daily PRN Waynetta Sandy, MD   5 mg at 04/17/21 0047   docusate sodium (COLACE) capsule 100 mg  100 mg Oral BID Waynetta Sandy, MD   100 mg at 04/19/21 2235   insulin aspart (novoLOG) injection 0-15 Units  0-15 Units Subcutaneous TID WC Waynetta Sandy, MD   2 Units at 04/19/21 1125   midodrine (PROAMATINE) tablet 10 mg  10 mg Oral TID WC Charlynne Cousins,  MD   10 mg at 04/19/21 1802   multivitamin with minerals tablet 1 tablet  1 tablet Oral Daily Waynetta Sandy, MD   1 tablet at 04/19/21 4825   nutrition supplement (JUVEN) (JUVEN) powder packet 1 packet  1 packet Oral BID BM Waynetta Sandy, MD   1 packet at 04/19/21 1801   ondansetron (ZOFRAN) injection 4 mg  4 mg Intravenous Q6H PRN Waynetta Sandy, MD   4 mg at 04/17/21 1611   ondansetron (ZOFRAN) tablet 4 mg  4 mg Oral Q6H PRN Waynetta Sandy, MD   4 mg at 04/16/21 1212   pantoprazole (PROTONIX) EC tablet 40 mg  40 mg Oral Daily Waynetta Sandy, MD   40 mg at 04/19/21 0851   polyethylene glycol (MIRALAX / GLYCOLAX) packet 17 g  17 g Oral Daily PRN Vernelle Emerald, MD   17 g at 04/16/21 2147   protein supplement (ENSURE MAX) liquid  11 oz Oral QHS Waynetta Sandy, MD   11 oz at 04/19/21 2236   sodium chloride flush (NS) 0.9 % injection 3 mL  3 mL Intravenous Q12H Waynetta Sandy, MD   3 mL at 04/19/21 2238   sodium chloride flush (NS) 0.9 % injection 3 mL  3 mL Intravenous PRN Waynetta Sandy, MD       sodium zirconium cyclosilicate (LOKELMA) packet 10 g  10 g Oral BID Roney Jaffe, MD   10 g at 04/19/21 2231     Objective: Vital signs in last 24 hours: BP 108/67 (BP Location: Left Arm)   Pulse 79   Temp 98.1 F (36.7 C) (Oral)   Resp 20   Ht 6' (1.829 m)   Wt 84.8 kg   SpO2 97%   BMI 25.35 kg/m   Intake/Output from previous day: 07/31 0701 - 08/01 0700 In: 3474.3 [P.O.:840; I.V.:1678.8; Blood:858; IV Piggyback:97.5] Out: 55 [Urine:55] Intake/Output this shift: No intake/output data recorded.   Physical Exam Vitals reviewed.  Constitutional:      Appearance: He is normal weight. He is ill-appearing.  HENT:     Head: Normocephalic and atraumatic.  Cardiovascular:     Rate and Rhythm: Normal rate and regular rhythm.  Pulmonary:     Effort:  Pulmonary effort is normal. No respiratory  distress.     Breath sounds: Normal breath sounds.  Abdominal:     Palpations: Abdomen is soft.     Tenderness: There is abdominal tenderness (in the LUQ and flank with abdominal fullness.).  Genitourinary:    Comments: Foley draining scant amount of clear urine.  Musculoskeletal:     Comments: Right LE normal.  Left s/p amputation.   Skin:    General: Skin is warm and dry.     Coloration: Skin is pale.  Neurological:     General: No focal deficit present.     Comments: Solumnent and difficult to arouse.     Lab Results:  Results for orders placed or performed during the hospital encounter of 04/08/21 (from the past 24 hour(s))  Prepare fresh frozen plasma     Status: None (Preliminary result)   Collection Time: 04/19/21  8:18 AM  Result Value Ref Range   Unit Number [id number]    Blood Component Type THAWED PLASMA    Unit division 00    Status of Unit ALLOCATED    Transfusion Status      OK TO TRANSFUSE Performed at [facility] [address], [place], [place] [address]   Glucose, capillary     Status: Abnormal   Collection Time: 04/19/21 11:00 AM  Result Value Ref Range   Glucose-Capillary 129 (H) 70 - 99 mg/dL  Protime-INR     Status: Abnormal   Collection Time: 04/19/21  3:48 PM  Result Value Ref Range   Prothrombin Time 17.9 (H) 11.4 - 15.2 seconds   INR 1.5 (H) 0.8 - 1.2  Glucose, capillary     Status: Abnormal   Collection Time: 04/19/21  4:30 PM  Result Value Ref Range   Glucose-Capillary 118 (H) 70 - 99 mg/dL  Glucose, capillary     Status: Abnormal   Collection Time: 04/19/21 10:23 PM  Result Value Ref Range   Glucose-Capillary 123 (H) 70 - 99 mg/dL  CBC     Status: Abnormal   Collection Time: 04/20/21  1:58 AM  Result Value Ref Range   WBC 13.9 (H) 4.0 - 10.5 K/uL   RBC 2.33 (L) 4.22 - 5.81 MIL/uL   Hemoglobin 5.8 (LL) 13.0 - 17.0 g/dL   HCT 18.6 (L) 39.0 - 52.0 %   MCV 79.8 (L) 80.0 - 100.0 fL   MCH 24.9 (L) 26.0 - 34.0 pg   MCHC 31.2  30.0 - 36.0 g/dL   RDW 18.6 (H) 11.5 - 15.5 %   Platelets 251 150 - 400 K/uL   nRBC 0.3 (H) 0.0 - 0.2 %  Protime-INR     Status: Abnormal   Collection Time: 04/20/21  1:58 AM  Result Value Ref Range   Prothrombin Time 15.9 (H) 11.4 - 15.2 seconds   INR 1.3 (H) 0.8 - 1.2  Basic metabolic panel     Status: Abnormal   Collection Time: 04/20/21  1:58 AM  Result Value Ref Range   Sodium 134 (L) 135 - 145 mmol/L   Potassium 5.5 (H) 3.5 - 5.1 mmol/L   Chloride 101 98 - 111 mmol/L   CO2 22 22 - 32 mmol/L   Glucose, Bld 119 (H) 70 - 99 mg/dL   BUN 74 (H) 8 - 23 mg/dL   Creatinine, Ser 5.63 (H) 0.61 - 1.24 mg/dL   Calcium 8.5 (L) 8.9 - 10.3 mg/dL   GFR, Estimated 10 (L) >60 mL/min   Anion  gap 11 5 - 15  CK     Status: Abnormal   Collection Time: 04/20/21  1:58 AM  Result Value Ref Range   Total CK 2,716 (H) 49 - 397 U/L  Prepare RBC (crossmatch)     Status: None   Collection Time: 04/20/21  3:21 AM  Result Value Ref Range   Order Confirmation      ORDER PROCESSED BY BLOOD BANK Performed at [facility], [place] [address], [place], [place] [address]   Creatinine, urine, random     Status: None   Collection Time: 04/20/21  4:15 AM  Result Value Ref Range   Creatinine, Urine 197.02 mg/dL  Sodium, urine, random     Status: None   Collection Time: 04/20/21  4:15 AM  Result Value Ref Range   Sodium, Ur 28 mmol/L  Glucose, capillary     Status: Abnormal   Collection Time: 04/20/21  6:14 AM  Result Value Ref Range   Glucose-Capillary 119 (H) 70 - 99 mg/dL  Prepare RBC (crossmatch)     Status: None   Collection Time: 04/20/21  7:30 AM  Result Value Ref Range   Order Confirmation      ORDER PROCESSED BY BLOOD BANK Performed at [facility], [place] [address], [place], [place] [address]     BMET Recent Labs    04/19/21 0559 04/20/21 0158  NA 132* 134*  K 5.6* 5.5*  CL 100 101  CO2 20* 22  GLUCOSE 122* 119*  BUN 59* 74*  CREATININE 5.12* 5.63*  CALCIUM 8.2* 8.5*    PT/INR Recent Labs    04/19/21 1548 04/20/21 0158  LABPROT 17.9* 15.9*  INR 1.5* 1.3*   ABG No results for input(s): PHART, HCO3 in the last 72 hours.  Invalid input(s): PCO2, PO2  Studies/Results: CT ABDOMEN PELVIS WO CONTRAST  Result Date: 04/18/2021 CLINICAL DATA:  Patient with anemia.  On anticoagulation. EXAM: CT ABDOMEN AND PELVIS WITHOUT CONTRAST TECHNIQUE: Multidetector CT imaging of the abdomen and pelvis was performed following the standard protocol without IV contrast. COMPARISON:  Renal ultrasound 03/09/2021 FINDINGS: Lower chest: Patchy ground-glass and consolidative opacities within the lower lobes bilaterally most compatible with atelectasis. Trace bilateral pleural effusions. Hepatobiliary: Liver is normal in size and contour. Stones and sludge within the gallbladder lumen. No intrahepatic or extrahepatic biliary ductal dilatation. Pancreas: Unremarkable Spleen: Unremarkable Adrenals/Urinary Tract: Normal adrenal glands. The left kidney is displaced cranially. There is moderate left hydronephrosis. Urinary bladder is unremarkable. Stomach/Bowel: No abnormal bowel wall thickening or evidence for bowel obstruction. No free intraperitoneal air. Vascular/Lymphatic: Normal caliber abdominal aorta. No retroperitoneal lymphadenopathy. Reproductive: Unremarkable. Other: There is a large 17 x 17 x 7 cm left retroperitoneal fluid collection with layering high density favored to represent blood products. Musculoskeletal: Mild expansion of the left iliacus muscle. Right hip joint degenerative changes. Lower thoracic and lumbar spine degenerative changes. There is mild height loss of the superior endplate of the L5 and L3 vertebral bodies. IMPRESSION: There is a large (up to 17 cm) left retroperitoneal hematoma. Additionally there is expansion of the left iliacus muscle most compatible with intramuscular hemorrhage. The large hematoma displaces the left kidney cranially. There is new moderate  left hydronephrosis, likely secondary to mass effect on the left ureter. Age-indeterminate superior endplate compression deformities of the L5 and L3 vertebral bodies. Critical Value/emergent results were discussed at the time of interpretation on 04/18/2021 at 1:47 pm to provider [facility] , who verbally acknowledged  these results. Electronically Signed   By: Lovey Newcomer M.D.   On: 04/18/2021 13:52   DG CHEST PORT 1 VIEW  Result Date: 04/19/2021 CLINICAL DATA:  75 year old male with shortness of breath. EXAM: PORTABLE CHEST 1 VIEW COMPARISON:  Portable chest 09/01/2020 and earlier. FINDINGS: Portable AP semi upright view at 0523 hours. Chronic cardiomegaly appears stable. Visualized tracheal air column is within normal limits. Chronic left chest AICD, cardiac valve replacement and sternotomy. Substantially lower lung volumes. No pneumothorax. Upper lung pulmonary vascularity appears normal. Patchy bibasilar opacity. Small pleural effusion(s) difficult to exclude. No air bronchograms. Negative visible bowel gas pattern. Stable visualized osseous structures. IMPRESSION: 1. Substantially lower lung volumes with bibasilar opacity, nonspecific but perhaps just atelectasis. Difficult to exclude small pleural effusions. 2. Cardiomegaly.  No acute pulmonary edema. Electronically Signed   By: Genevie Ann M.D.   On: 04/19/2021 08:06   CT RENAL STONE STUDY  Result Date: 04/19/2021 CLINICAL DATA:  Acute renal failure.  Decreased urine output. EXAM: CT ABDOMEN AND PELVIS WITHOUT CONTRAST TECHNIQUE: Multidetector CT imaging of the abdomen and pelvis was performed following the standard protocol without IV contrast. COMPARISON:  04/18/2021 FINDINGS: Lower chest: Subpleural atelectasis and consolidation noted within both lung bases. Cardiac enlargement. ICD leads are noted within the right ventricle and left atrial appendage. Hepatobiliary: No focal liver abnormality identified. Small stones are noted layering within  the gallbladder. No gallbladder wall thickening or pericholecystic inflammatory fat stranding. No bile duct dilatation. Pancreas: No acute findings. Spleen: Normal in size without focal abnormality. Adrenals/Urinary Tract: Normal adrenal glands. Right kidney is unremarkable. Left-sided hydronephrosis and hydroureter identified. I suspect this is secondary to mass effect from left retroperitoneal hematoma. Urinary bladder is grossly unremarkable. Stomach/Bowel: Stomach appears normal. There are a few mildly increased loops of small bowel which measure up to 2.7 cm. No specific findings to suggest bowel obstruction with gas and stool noted throughout the colon up to the level of the rectum. Vascular/Lymphatic: Aortic atherosclerosis. No aneurysm. No abdominopelvic adenopathy. Reproductive: Prostate is unremarkable. Other: Large left retroperitoneal hematoma is again identified. Using the same measuring technique as the previous exam this measures 16.5 x 7.8 by 16.8 cm, image 53/6 and image 57/3. Layering high attenuation material is identified consistent with subacute to acute blood products. On the previous exam this measured 18.2 x 8.5 by 17.3 cm. Intramuscular hematoma involving the left iliopsoas muscle is stable from previous exam. Small amount of mixed attenuation fluid infiltration within the presacral soft tissue space appears similar. Musculoskeletal: There is multilevel superior endplate compression deformities within the lumbar spine which appear unchanged from the previous exam and remain indeterminate. Multilevel degenerative disc disease also noted. IMPRESSION: 1. Similar appearance of large left retroperitoneal hematoma. 2. Left-sided hydronephrosis and hydroureter identified. I suspect this is secondary to mass effect from left retroperitoneal hematoma. 3. Similar appearance of intramuscular hematoma involving the left iliopsoas muscle. 4. Aortic atherosclerosis. 5. Gallstones. Aortic Atherosclerosis  (ICD10-I70.0). Electronically Signed   By: Kerby Moors M.D.   On: 04/19/2021 07:00    I have discussed his case with Dr. Aileen Fass and reviewed the notes, labs and imaging.   Assessment/Plan: Left hydronephrosis from a large left retroperitoneal hematoma with associated AKI and anuria that is probably predominantly from ATN from a hypotensive episode with the bleed.  I would recommend proceeding with dialysis if planned.   I think that decompression of the left kidney will make much difference in the renal function at this time, but  if decompression is desired, a nephrostomy tube would be a safer option at this time since his INR is down to 1.3 and he is a poor candidate for a general anesthetic that would be required for a stent.           No follow-ups on file.    CC: Dr. Bess Harvest Everitt Amber 04/20/2021 [phone]

## 2021-04-20 NOTE — Progress Notes (Signed)
Reported Hg drop 5.8 K from 7.5 K yesterday.  2 U PRBCs ordered to be transfused due to known large left retroperitoneal hematoma (similar appearance on CT 7/31) and worsening renal function likely contributing to his anemia.  Will continue to monitor for any acute changes.

## 2021-04-20 NOTE — Progress Notes (Signed)
Physical Therapy Treatment Patient Details Name: Ryan [name] MRN: [id number] DOB: [birth year] Today's Date: 04/20/2021    History of Present Illness 75 y.o. 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 Scheduled to have debridement on 7/20 but due to an INR of 3.2 surgery was postponed. He eventually underwent partial foot/toe amputation with excisional debridement and wound VAC placement on 7/21, left lower extremity angiogram in 7/25, and R IJ temp HD catheter placement on 8/1.  Pt with L sided hydronephrosis possibly secondary to L retroperitoneal hematoma, s/p nephrostomy tube on 8/1 to drain.  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    PT Comments    Pt remains lethargic, but arousable after IJ procedure earlier today.  Pt's son assisted me in getting pt to EOB for some change in positioning.  Max to total assist of two people to get EOB and positioned back in bed comfortably.  We worked on sitting balance and bil UE/LE exercises in sitting until pt fatigued.  He was not ready for slide board to chair today, but we can re-assess slide board vs maxi move total lift OOB next session.    Follow Up Recommendations  SNF     Equipment Recommendations  Wheelchair (measurements PT);Wheelchair cushion (measurements PT);Hospital bed;Other (comment) (hoyer lift)    Recommendations for Other Services       Precautions / Restrictions Precautions Precautions: Fall Precaution Comments: chronic back pain, back precs for comfort Required Braces or Orthoses: Other Brace Other Brace: L nephrostomy tube/drain Restrictions RLE Weight Bearing: Non weight bearing    Mobility  Bed Mobility Overal bed mobility: Needs Assistance Bed Mobility: Rolling;Sidelying to Sit;Sit to Sidelying Rolling: +2 for physical assistance;Max  assist Sidelying to sit: +2 for physical assistance;HOB elevated;Max assist     Sit to sidelying: Max assist;+2 for physical assistance;HOB elevated General bed mobility comments: Pt rolled bil for peri care, son assisting in mobility.  Max assist to come to sitting with multimodal cues for sequencing, significant assist at bil legs and trunk, posterior preference during transitions.  Assist of legs and trunk to return to bed and total assist +2 to scoot to [facility] with HOB in max trendelenberg.    Transfers                 General transfer comment: NT today as pt was too lethargic and required too much assist to maintain sitting balance EOB.  Ambulation/Gait                 Stairs             Wheelchair Mobility    Modified Rankin (Stroke Patients Only)       Balance Overall balance assessment: Needs assistance Sitting-balance support: Feet supported;Feet unsupported;Bilateral upper extremity supported Sitting balance-Leahy Scale: Zero Sitting balance - Comments: max to total assist EOB due to posterior preference.  Pt moved from anterior assist at trunk to providing back support from behind.  Son was active in assisting and encouraging his father as well as physically helpint PT. Postural control: Posterior lean;Left lateral lean                                  Cognition Arousal/Alertness: Lethargic;Suspect due to medications Behavior During Therapy: [facility] for tasks assessed/performed  Overall Cognitive Status: Impaired/Different from baseline                                 General Comments: Pt lethargic, difficulty keeping eyes open, at times confused, although seems aware of his decreased mentation.  He was able to accurately relay his NWB R foot status.      Exercises General Exercises - Upper Extremity Shoulder Flexion: AAROM;Both;10 reps Elbow Flexion: AAROM;Both;10 reps General Exercises - Lower Extremity Long Arc Quad:  AROM;Both;10 reps (limited AROM) Toe Raises: AROM;Both;10 reps    General Comments        Pertinent Vitals/Pain Pain Assessment: Faces Faces Pain Scale: Hurts even more Pain Location: right foot Pain Descriptors / Indicators: Aching;Discomfort;Grimacing;Guarding;Sore;Moaning;Cramping    Home Living                      Prior Function            PT Goals (current goals can now be found in the care plan section) Acute Rehab PT Goals Patient Stated Goal: none stated today Progress towards PT goals: Not progressing toward goals - comment (more lethargic today after his IJ procedure)    Frequency    Min 2X/week      PT Plan Current plan remains appropriate    Co-evaluation              AM-PAC PT "6 Clicks" Mobility   Outcome Measure  Help needed turning from your back to your side while in a flat bed without using bedrails?: Total Help needed moving from lying on your back to sitting on the side of a flat bed without using bedrails?: Total Help needed moving to and from a bed to a chair (including a wheelchair)?: Total Help needed standing up from a chair using your arms (e.g., wheelchair or bedside chair)?: Total Help needed to walk in hospital room?: Total Help needed climbing 3-5 steps with a railing? : Total 6 Click Score: 6    End of Session   Activity Tolerance: Patient limited by fatigue Patient left: in bed;with call bell/phone within reach;with family/visitor present Nurse Communication: Other (comment) (to RN tech that condom cath came off) PT Visit Diagnosis: Unsteadiness on feet (R26.81);Other abnormalities of gait and mobility (R26.89);Muscle weakness (generalized) (M62.81);Difficulty in walking, not elsewhere classified (R26.2);Pain Pain - Right/Left: Right Pain - part of body: Ankle and joints of foot;Leg     Time: [id number] PT Time Calculation (min) (ACUTE ONLY): 37 min  Charges:  $Therapeutic Activity: 23-37 mins                      Verdene Lennert, PT, DPT  Acute Rehabilitation Ortho Tech Supervisor [phone] pager [phone]) [phone] office

## 2021-04-20 NOTE — Procedures (Addendum)
Interventional Radiology Procedure Note  Procedure: Image guided left PCN.  31F drain.  Complications: None  Recommendations: - Routine drain care, record output - routine wound care - OK to restart blood thinners as needed  Signed,  Dulcy Fanny. Earleen Newport, DO

## 2021-04-20 NOTE — Progress Notes (Signed)
Nephrology Follow-Up Consult note   Assessment/Recommendations: Ryan [name] is a/an 75 y.o. 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  Creatinine significantly worsened to 5.6 today.  This is in the setting of worsening bleed with significant hypotension likely causing ATN.  Renal imaging did demonstrate left-sided hydronephrosis from retroperitoneal bleed.  Urology saw the patient.  Agree that nephrostomy tube may only have borderline help but want to give him every opportunity to have renal recovery as well as see signs of renal recovery with increased urine output.  Patient appears to have worsening confusion today concerning for uremia -Consult IR today for temporary dialysis catheter as well as nephrostomy tube placement; greatly appreciate help -Plan for dialysis today or tomorrow pending catheter placement -Continue to monitor daily Cr, Dose meds for GFR -Monitor Daily I/Os, Daily weight  -Maintain MAP>65 for optimal renal perfusion.  -Avoid nephrotoxic medications including NSAIDs and Vanc/Zosyn combo  Left-sided hydronephrosis: This is secondary to retroperitoneal bleed causing obstruction.  Urology agrees nephrostomy tube borderline efficacy but will try given of her renal failure. -Appreciate help from IR and urology -Urology recommends conversion to antegrade stent in the future w/ IR  Right critical lower limb ischemia: Continue dressing changes and management per primary team  Retroperitoneal bleed: Persistent anemia this morning.  Continue aggressive transfusions as needed.  Ongoing anemia likely contributing to AKI  Systolic heart failure: On midodrine.  Holding Lasix.  Monitor volume status closely  DM2: Continue sliding scale per primary  team   Recommendations conveyed to primary service.    [facility] 04/20/2021 11:47 AM  ___________________________________________________________  CC: AKI on CKD  Interval History/Subjective: Patient states he feels okay today but does admit to some confusion as well as some nausea.  Creatinine is continued to rise up to 5.6.  Urine output has been low.   Medications:  Current Facility-Administered Medications  Medication Dose Route Frequency Provider Last Rate Last Admin   0.9 %  sodium chloride infusion (Manually program via Guardrails IV Fluids)   Intravenous Once Cumming, Carole N, DO       0.9 %  sodium chloride infusion   Intravenous Continuous Roney Jaffe, MD 65 mL/hr at 04/20/21 0857 New Bag at 04/20/21 0857   alum & mag hydroxide-simeth (MAALOX/MYLANTA) 200-200-20 MG/5ML suspension 30 mL  30 mL Oral Q4H PRN Charlynne Cousins, MD   30 mL at 04/17/21 0144   amiodarone (PACERONE) tablet 200 mg  200 mg Oral Daily Waynetta Sandy, MD   200 mg at 04/20/21 0844   atorvastatin (LIPITOR) tablet 40 mg  40 mg Oral q1800 Waynetta Sandy, MD   40 mg at 04/19/21 1803   bisacodyl (DULCOLAX) EC tablet 5 mg  5 mg Oral Daily PRN Waynetta Sandy, MD   5 mg at 04/17/21 0047   [START ON 04/21/2021] cefTRIAXone (ROCEPHIN) 1 g in sodium chloride 0.9 % 100 mL IVPB  1 g Intravenous Q24H Reesa Chew, MD       cefTRIAXone (ROCEPHIN) 2 g in sodium chloride 0.9 % 100 mL IVPB  2 g Intravenous to Edward Jolly, PA-C       docusate sodium (COLACE) capsule 100 mg  100 mg Oral BID Waynetta Sandy, MD   100 mg  at 04/20/21 0844   insulin aspart (novoLOG) injection 0-15 Units  0-15 Units Subcutaneous TID WC Waynetta Sandy, MD   2 Units at 04/19/21 1125   midodrine (PROAMATINE) tablet 10 mg  10 mg Oral TID WC Charlynne Cousins, MD   10 mg at 04/20/21 1111   multivitamin with minerals tablet 1 tablet  1 tablet Oral  Daily Waynetta Sandy, MD   1 tablet at 04/20/21 [id number]   nutrition supplement (JUVEN) (JUVEN) powder packet 1 packet  1 packet Oral BID BM Waynetta Sandy, MD   1 packet at 04/19/21 1801   ondansetron (ZOFRAN) injection 4 mg  4 mg Intravenous Q6H PRN Waynetta Sandy, MD   4 mg at 04/17/21 1611   ondansetron (ZOFRAN) tablet 4 mg  4 mg Oral Q6H PRN Waynetta Sandy, MD   4 mg at 04/16/21 1212   pantoprazole (PROTONIX) EC tablet 40 mg  40 mg Oral Daily Waynetta Sandy, MD   40 mg at 04/20/21 0844   polyethylene glycol (MIRALAX / GLYCOLAX) packet 17 g  17 g Oral Daily PRN Vernelle Emerald, MD   17 g at 04/16/21 2147   protein supplement (ENSURE MAX) liquid  11 oz Oral QHS Waynetta Sandy, MD   11 oz at 04/19/21 2236   sodium chloride flush (NS) 0.9 % injection 3 mL  3 mL Intravenous Q12H Waynetta Sandy, MD   3 mL at 04/20/21 0847   sodium chloride flush (NS) 0.9 % injection 3 mL  3 mL Intravenous PRN Waynetta Sandy, MD       sodium zirconium cyclosilicate (LOKELMA) packet 10 g  10 g Oral BID Roney Jaffe, MD   10 g at 04/20/21 [id number]      Review of Systems: 10 systems reviewed and negative except per interval history/subjective  Physical Exam: Vitals:   04/20/21 0852 04/20/21 1112  BP: (!) 119/51 (!) 116/57  Pulse: 80 80  Resp: 20 20  Temp: 98.4 F (36.9 C) 98.7 F (37.1 C)  SpO2: 97% 96%   Total I/O In: 310 [Blood:310] Out: -   Intake/Output Summary (Last 24 hours) at 04/20/2021 1147 Last data filed at 04/20/2021 1112 Gross per 24 hour  Intake 2704.17 ml  Output 55 ml  Net 2649.17 ml   Constitutional: Ill-appearing, lying in bed, no distress ENMT: ears and nose without scars or lesions, MMM CV: normal rate, trace edema in the lower extremities Respiratory: Bilateral chest rise with no increased work of breathing Gastrointestinal: soft, non-tender, no palpable masses or hernias Skin: no visible  lesions or rashes Psych: Awake, alert, answers questions appropriately but does admit to some confusion   Test Results I personally reviewed new and old clinical labs and radiology tests Lab Results  Component Value Date   NA 134 (L) 04/20/2021   K 5.5 (H) 04/20/2021   CL 101 04/20/2021   CO2 22 04/20/2021   BUN 74 (H) 04/20/2021   CREATININE 5.63 (H) 04/20/2021   CALCIUM 8.5 (L) 04/20/2021   ALBUMIN 3.2 (L) 04/08/2021   PHOS 5.9 (H) 04/18/2021

## 2021-04-20 NOTE — Progress Notes (Signed)
Mobility Specialist: Progress Note   04/20/21 1446  Mobility  Activity  (Cancel)   Instructed by RN to hold off on seeing pt today. Pt is very lethargic today from pain medication. Will f/u tomorrow.   [facility] [place] Mobility Specialist Mobility Specialist Phone: [phone]

## 2021-04-20 NOTE — Procedures (Signed)
Interventional Radiology Procedure Note  Procedure: Placement of a right IJ approach temp HD/trialysis catheter.  Tip is positioned at the superior cavoatrial junction and catheter is ready for immediate use.  Complications: None Recommendations:  - Ok to use - Do not submerge - Routine line care   Signed,  Dulcy Fanny. Earleen Newport, DO

## 2021-04-20 NOTE — Care Management Important Message (Signed)
Important Message  Patient Details  Name: Ryan [name] MRN: [id number] Date of Birth: 04/10/1946   Medicare Important Message Given:  Yes     Shelda Altes 04/20/2021, 10:13 AM

## 2021-04-20 NOTE — TOC Progression Note (Addendum)
Transition of Care [facility]) - Progression Note    Patient Details  Name: Ryan [name] MRN: [id number] Date of Birth: 1946/05/14  Transition of Care [facility]) CM/SW [place], [place] Phone Number: 04/20/2021, 11:27 AM  Clinical Narrative:     CSW informed [place] patient not medically stable for d/c- informed SNF of day at previous SNF [facility], 03/12/2021- 04/08/2021, possible co-pay status, will need to review and confirm.  Thurmond Butts, MSW, LCSW Clinical Social Worker     Expected Discharge Plan: Home w Home Health Services Barriers to Discharge: Other (must enter comment), Continued Medical Work up (Awaiting Wound Vac approval and  homehealth pending)  Expected Discharge Plan and Services Expected Discharge Plan: [place] In-house Referral: [facility] Discharge Planning Services: CM Consult Post Acute Care Choice: [place] arrangements for the past 2 months: [place]                 DME Arranged: Negative pressure wound device, Vac ([place]) DME Agency: Other - Comment, [employer] Date DME Agency Contacted: 04/15/21 Time DME Agency Contacted: 1200 Representative spoke with at DME Agency: Olivia Mackie HH Arranged: RN, PT, OT [facility] Agency: [place] Date [place]: 04/15/21 Time [place]: 49 Representative spoke with at [place]: [place] ([place]) Interventions    Readmission Risk Interventions Readmission Risk Prevention Plan 04/15/2021  Home Care Screening Complete  Medication Review (RN CM) Complete  Some recent data might be hidden

## 2021-04-20 NOTE — Progress Notes (Signed)
Nutrition Follow Up  DOCUMENTATION CODES:   Not applicable  INTERVENTION:   Given need for HD, post op status, and poor PO intake, recommend Cortrak placement with EN if within [place].   No documented BM x 12 days, recommend suppository and strengthening bowel regimen.   Add Nepro Shake po BID, each supplement provides 425 kcal and 19 grams protein  Add 30 ml ProSource Plus BID, each supplement provides 100 kcals and 15 grams protein.  Add B complex with Vitamin C  NUTRITION DIAGNOSIS:   Increased nutrient needs related to post-op healing as evidenced by estimated needs.  Ongoing  GOAL:   Patient will meet greater than or equal to 90% of their needs  Not meeting   MONITOR:   PO intake, Supplement acceptance, Diet advancement, Labs, Weight trends, Skin, I & O's  REASON FOR ASSESSMENT:   Consult Assessment of nutrition requirement/status, Wound healing  ASSESSMENT:   Ryan [name] is a 75 y.o. male with medical history significant of afib; MVR; chronic systolic CHF with AICD in place; DM; HTN; and PVD presenting for foot debridement/possible BKA.    He has had difficulty with a heel ulcer, not healing up well.  He was coming in today for surgery to have it cleaned up, possible amputation.  Unable to do the surgery because his INR was too high.  He takes Coumadin for afib.  Some days he feels ok and some he doesn't.  He has not feeling like he had a fever. Pt admitted rt foot wounds with critical limb ischemia.   7/21- s/p first/second R toe amputations, R heel excisional debridement, VAC placement 7/25- s/p LLE angiography  Has R IJ placed and L nephrostomy placed in IR this am. Plan for HD today or tomorrow given worsening renal function.   Patient's mental status has declined. Unable to participate in RD conversation. Per RN, PO intake has declined over the last 3-4 days due to this. Not taking supplements. Given need for HD, post op status, and poor PO intake, recommend  Cortrak placement with EN if within [place]. Change to complete nutrition supplement to maximize kcal and protein.   Admission weight: 73.1 kg  Current weight: 84.8 kg   UOP: 55 ml x 24 hrs Wound VAC R ankle: 0 ml documented    Drips: NS @ 65 ml/hr  Medications: colace, SS novolog, lokelma Labs: Na 134 (L) K 5.5 (H) Cr 5.63- trending up CBG 114-119  Diet Order:   Diet Order             Diet NPO time specified Except for: Sips with Meds  Diet effective now                   EDUCATION NEEDS:   Education needs have been addressed  Skin:  Skin Assessment: Skin Integrity Issues: Skin Integrity Issues:: Other (Comment) Other: rt foot wound with critical limb ischemia, L heel and L flank   Last BM:  unknown  Height:   Ht Readings from Last 1 Encounters:  04/08/21 6' (1.829 m)    Weight:   Wt Readings from Last 1 Encounters:  04/20/21 84.8 kg    Ideal Body Weight:  80.9 kg  BMI:  Body mass index is 25.35 kg/m.  Estimated Nutritional Needs:   Kcal:  2200-2400  Protein:  105-120 grams  Fluid:  > 2 L  Dianca Owensby MS, RD, LDN, CNSC Clinical Nutrition Pager listed in [place]

## 2021-04-20 NOTE — Progress Notes (Signed)
[place] family meeting planned for tomorrow, 04/21/21, with patient and son [name] at [place]. Ilsa Iha, FNP-BC Palliative Medicine Team Team Phone # [phone] Pager # [phone]   NO CHARGE

## 2021-04-21 DIAGNOSIS — I120 Hypertensive chronic kidney disease with stage 5 chronic kidney disease or end stage renal disease: Secondary | ICD-10-CM | POA: Diagnosis not present

## 2021-04-21 DIAGNOSIS — Z515 Encounter for palliative care: Secondary | ICD-10-CM | POA: Diagnosis not present

## 2021-04-21 DIAGNOSIS — R531 Weakness: Secondary | ICD-10-CM

## 2021-04-21 DIAGNOSIS — Z7189 Other specified counseling: Secondary | ICD-10-CM | POA: Diagnosis not present

## 2021-04-21 DIAGNOSIS — N185 Chronic kidney disease, stage 5: Secondary | ICD-10-CM

## 2021-04-21 DIAGNOSIS — I70229 Atherosclerosis of native arteries of extremities with rest pain, unspecified extremity: Secondary | ICD-10-CM | POA: Diagnosis not present

## 2021-04-21 LAB — CBC
HCT: 21.5 % — ABNORMAL LOW (ref 39.0–52.0)
HCT: 29.3 % — ABNORMAL LOW (ref 39.0–52.0)
Hemoglobin: 6.9 g/dL — CL (ref 13.0–17.0)
Hemoglobin: 9.5 g/dL — ABNORMAL LOW (ref 13.0–17.0)
MCH: 25.9 pg — ABNORMAL LOW (ref 26.0–34.0)
MCH: 26.7 pg (ref 26.0–34.0)
MCHC: 32.1 g/dL (ref 30.0–36.0)
MCHC: 32.4 g/dL (ref 30.0–36.0)
MCV: 80.8 fL (ref 80.0–100.0)
MCV: 82.3 fL (ref 80.0–100.0)
Platelets: 230 10*3/uL (ref 150–400)
Platelets: 202 10*3/uL (ref 150–400)
RBC: 2.66 MIL/uL — ABNORMAL LOW (ref 4.22–5.81)
RBC: 3.56 MIL/uL — ABNORMAL LOW (ref 4.22–5.81)
RDW: 19.3 % — ABNORMAL HIGH (ref 11.5–15.5)
RDW: 18.8 % — ABNORMAL HIGH (ref 11.5–15.5)
WBC: 11.4 10*3/uL — ABNORMAL HIGH (ref 4.0–10.5)
WBC: 10.7 10*3/uL — ABNORMAL HIGH (ref 4.0–10.5)
nRBC: 0.4 % — ABNORMAL HIGH (ref 0.0–0.2)
nRBC: 0.2 % (ref 0.0–0.2)

## 2021-04-21 LAB — BASIC METABOLIC PANEL WITH GFR
Anion gap: 14 (ref 5–15)
BUN: 89 mg/dL — ABNORMAL HIGH (ref 8–23)
CO2: 22 mmol/L (ref 22–32)
Calcium: 8.4 mg/dL — ABNORMAL LOW (ref 8.9–10.3)
Chloride: 99 mmol/L (ref 98–111)
Creatinine, Ser: 5.07 mg/dL — ABNORMAL HIGH (ref 0.61–1.24)
GFR, Estimated: 11 mL/min — ABNORMAL LOW
Glucose, Bld: 108 mg/dL — ABNORMAL HIGH (ref 70–99)
Potassium: 4.5 mmol/L (ref 3.5–5.1)
Sodium: 135 mmol/L (ref 135–145)

## 2021-04-21 LAB — PROTIME-INR
INR: 1.6 — ABNORMAL HIGH (ref 0.8–1.2)
Prothrombin Time: 19.2 s — ABNORMAL HIGH (ref 11.4–15.2)

## 2021-04-21 LAB — GLUCOSE, CAPILLARY
Glucose-Capillary: 108 mg/dL — ABNORMAL HIGH (ref 70–99)
Glucose-Capillary: 96 mg/dL (ref 70–99)
Glucose-Capillary: 122 mg/dL — ABNORMAL HIGH (ref 70–99)
Glucose-Capillary: 114 mg/dL — ABNORMAL HIGH (ref 70–99)

## 2021-04-21 LAB — URINE CULTURE: Culture: NO GROWTH

## 2021-04-21 LAB — HEPATITIS B SURFACE ANTIGEN: Hepatitis B Surface Ag: NONREACTIVE

## 2021-04-21 LAB — PREPARE RBC (CROSSMATCH)

## 2021-04-21 MED ORDER — ALTEPLASE 2 MG IJ SOLR: 2.0000 mg | Freq: Once | INTRAMUSCULAR | Status: DC | PRN

## 2021-04-21 MED ORDER — ACETAMINOPHEN 325 MG PO TABS
650.0000 mg | ORAL_TABLET | Freq: Three times a day (TID) | ORAL | Status: DC | PRN
  Administered 2021-04-21 – 2021-05-22 (×7): 650 mg via ORAL
  Filled 2021-04-21 (×7): qty 2

## 2021-04-21 MED ORDER — SODIUM CHLORIDE 0.9% IV SOLUTION: Freq: Once | INTRAVENOUS | Status: DC

## 2021-04-21 MED ORDER — LIDOCAINE-PRILOCAINE 2.5-2.5 % EX CREA: 1.0000 "application " | TOPICAL_CREAM | CUTANEOUS | Status: DC | PRN

## 2021-04-21 MED ORDER — HYDROCODONE-ACETAMINOPHEN 7.5-325 MG PO TABS
1.0000 | ORAL_TABLET | Freq: Four times a day (QID) | ORAL | Status: DC | PRN
  Administered 2021-04-21 – 2021-05-23 (×53): 1 via ORAL
  Filled 2021-04-21 (×54): qty 1

## 2021-04-21 MED ORDER — LIDOCAINE HCL (PF) 1 % IJ SOLN: 5.0000 mL | INTRAMUSCULAR | Status: DC | PRN

## 2021-04-21 MED ORDER — SODIUM CHLORIDE 0.9 % IV SOLN: 100.0000 mL | INTRAVENOUS | Status: DC | PRN

## 2021-04-21 MED ORDER — PENTAFLUOROPROP-TETRAFLUOROETH EX AERO: 1.0000 "application " | INHALATION_SPRAY | CUTANEOUS | Status: DC | PRN

## 2021-04-21 MED ORDER — HEPARIN SODIUM (PORCINE) 1000 UNIT/ML DIALYSIS
1000.0000 [IU] | INTRAMUSCULAR | Status: DC | PRN
  Filled 2021-04-21: qty 1

## 2021-04-21 MED ORDER — ACETAMINOPHEN 325 MG PO TABS: 650.0000 mg | ORAL_TABLET | Freq: Four times a day (QID) | ORAL | Status: DC | PRN

## 2021-04-21 MED ORDER — VITAMIN K1 10 MG/ML IJ SOLN
2.0000 mg | Freq: Once | INTRAMUSCULAR | Status: AC
  Administered 2021-04-21: 2 mg via SUBCUTANEOUS
  Filled 2021-04-21: qty 0.2

## 2021-04-21 NOTE — Progress Notes (Signed)
@  [facility] Dr. Marlowe Sax, on-call for attending, text-paged regard pt's critical Hgb of 6.9 (down from 7.1).  Page returned and order received for 1 unit PRBCs.

## 2021-04-21 NOTE — Progress Notes (Signed)
Referring Physician(s): Keene Breath, MD  Supervising Physician: Michaelle Birks  Patient Status:  [name] - In-pt  Chief Complaint:  Left hydronephrosis secondary large retroperitoneal hematoma  Subjective: L nephrostomy tube placed in IR yesterday Pt in bed resting; c/o abd pain. Son at bedside. Output of PCN is clear and yellow   Allergies: [name] allergy]  Medications: Prior to Admission medications   Medication Sig Start Date End Date Taking? Authorizing Provider  acetaminophen (TYLENOL) 325 MG tablet Take 650 mg by mouth every 6 (six) hours as needed for mild pain or headache.   Yes [provider]  amiodarone (PACERONE) 200 MG tablet TAKE 1 TABLET BY MOUTH EVERY DAY Patient taking differently: Take 200 mg by mouth daily. 03/03/21  Yes Bhagat, Bhavinkumar, PA  atorvastatin (LIPITOR) 40 MG tablet Take 1 tablet (40 mg total) by mouth daily at 6 PM. 10/06/12  Yes Timmothy Euler, MD  bisacodyl (DULCOLAX) 5 MG EC tablet Take 5 mg by mouth daily as needed for moderate constipation.   Yes [provider]  docusate sodium (COLACE) 100 MG capsule Take 100 mg by mouth daily.   Yes [provider]  empagliflozin (JARDIANCE) 10 MG TABS tablet Take 1 tablet (10 mg total) by mouth daily. 11/11/20  Yes Larey Dresser, MD  feeding supplement (ENSURE ENLIVE / ENSURE PLUS) LIQD Take 237 mLs by mouth 2 (two) times daily between meals. 03/12/21  Yes Rhyne, Hulen Shouts, PA-C  furosemide (LASIX) 20 MG tablet Take 2 tablets (40 mg total) by mouth daily for 3 days, THEN 2 tablets (40 mg total) as needed for edema. 04/03/21 04/06/22 Yes Mil[place], Maricela Bo, FNP  gabapentin (NEURONTIN) 300 MG capsule Take 300 mg by mouth at bedtime.   Yes [provider]  guaiFENesin (ROBITUSSIN) 100 MG/5ML SOLN Take 5 mLs by mouth every 4 (four) hours as needed for cough or to loosen phlegm.   Yes [provider]  HYDROcodone-acetaminophen (NORCO/VICODIN) 5-325 MG tablet Take  1 tablet by mouth 2 (two) times daily as needed for moderate pain. 03/12/21  Yes Rhyne, Hulen Shouts, PA-C  Multiple Vitamins-Minerals (CENTRUM SILVER PO) Take 1 tablet by mouth 3 (three) times a week.   Yes [provider]  mupirocin ointment (BACTROBAN) 2 % Apply 1 application topically 2 (two) times daily.   Yes [provider]  pantoprazole (PROTONIX) 40 MG tablet Take 40 mg by mouth daily.  11/12/14  Yes [provider]  triamcinolone cream (KENALOG) 0.5 % Apply 1 application topically daily as needed (skin rash).  02/15/14  Yes [provider]  warfarin (COUMADIN) 7.5 MG tablet Take 7.5 mg by mouth daily.   Yes [provider]  potassium chloride SA (KLOR-CON) 10 MEQ tablet Take 1 tablet (10 mEq total) by mouth daily for 3 days. 04/03/21 04/06/21  Rafael Bihari, FNP     Vital Signs: BP 136/60   Pulse 85   Temp 99.7 F (37.6 C) (Oral)   Resp 17   Ht 6' (1.829 m)   Wt 188 lb 4.4 oz (85.4 kg)   SpO2 93%   BMI 25.53 kg/m   Physical Exam Skin:    General: Skin is warm.     Comments: Site is clean, dry, non-tender No sign of infection No hematoma Output 165cc today Output clear, yellow      Imaging: CT ABDOMEN PELVIS WO CONTRAST  Result Date: 04/18/2021 CLINICAL DATA:  Patient with anemia.  On anticoagulation. EXAM: CT ABDOMEN  AND PELVIS WITHOUT CONTRAST TECHNIQUE: Multidetector CT imaging of the abdomen and pelvis was performed following the standard protocol without IV contrast. COMPARISON:  Renal ultrasound 03/09/2021 FINDINGS: Lower chest: Patchy ground-glass and consolidative opacities within the lower lobes bilaterally most compatible with atelectasis. Trace bilateral pleural effusions. Hepatobiliary: Liver is normal in size and contour. Stones and sludge within the gallbladder lumen. No intrahepatic or extrahepatic biliary ductal dilatation. Pancreas: Unremarkable Spleen: Unremarkable Adrenals/Urinary Tract: Normal adrenal glands.  The left kidney is displaced cranially. There is moderate left hydronephrosis. Urinary bladder is unremarkable. Stomach/Bowel: No abnormal bowel wall thickening or evidence for bowel obstruction. No free intraperitoneal air. Vascular/Lymphatic: Normal caliber abdominal aorta. No retroperitoneal lymphadenopathy. Reproductive: Unremarkable. Other: There is a large 17 x 17 x 7 cm left retroperitoneal fluid collection with layering high density favored to represent blood products. Musculoskeletal: Mild expansion of the left iliacus muscle. Right hip joint degenerative changes. Lower thoracic and lumbar spine degenerative changes. There is mild height loss of the superior endplate of the L5 and L3 vertebral bodies. IMPRESSION: There is a large (up to 17 cm) left retroperitoneal hematoma. Additionally there is expansion of the left iliacus muscle most compatible with intramuscular hemorrhage. The large hematoma displaces the left kidney cranially. There is new moderate left hydronephrosis, likely secondary to mass effect on the left ureter. Age-indeterminate superior endplate compression deformities of the L5 and L3 vertebral bodies. Critical Value/emergent results were discussed at the time of interpretation on 04/18/2021 at 1:47 pm to provider [facility] , who verbally acknowledged these results. Electronically Signed   By: Lovey Newcomer M.D.   On: 04/18/2021 13:52   IR Fluoro Guide CV Line Right  Result Date: 04/20/2021 INDICATION: 75 year old male with spontaneous retroperitoneal hemorrhage, acute renal failure, with left-sided ureteral obstruction EXAM: ULTRASOUND-GUIDED LEFT PERCUTANEOUS NEPHROSTOMY IMAGE GUIDED TEMPORARY HEMODIALYSIS CATHETER COMPARISON:  CT 04/19/2021 MEDICATIONS: 2 g Rocephin; The antibiotic was administered in an appropriate time frame prior to skin puncture. ANESTHESIA/SEDATION: Fentanyl 50 mcg IV; Versed 0 mg IV Moderate Sedation Time:  0 The patient was continuously monitored during  the procedure by the interventional radiology nurse under my direct supervision. CONTRAST:  17mL OMNIPAQUE IOHEXOL 300 MG/ML SOLN - administered into the collecting system(s) FLUOROSCOPY TIME:  Fluoroscopy Time: 2 minutes 12 seconds (14 mGy). COMPLICATIONS: None PROCEDURE: Informed written consent was obtained from the patient's family after a thorough discussion of the procedural risks, benefits and alternatives, regarding image guided percutaneous nephrostomy. All questions were addressed. Informed written consent was also obtained from the patient's family after a discussion of the risks, benefits, and alternatives to treatment, regarding image guided hemodialysis catheter placement. Questions regarding the procedure were encouraged and answered. Patient positioned prone position on the fluoroscopy table. Ultrasound survey of the left flank was performed with images stored and sent to PACs. The patient was then prepped and draped in the usual sterile fashion. 1% lidocaine was used to anesthetize the skin and subcutaneous tissues for local anesthesia. A Chiba needle was then used to access a posterior inferior calyx with ultrasound guidance. With spontaneous urine returned through the needle, passage of an 018 micro wire into the collecting system was performed under fluoroscopy. A small incision was made with an 11 blade scalpel, and the needle was removed from the wire. An Accustick system was then advanced over the wire into the collecting system under fluoroscopy. The metal stiffener and inner dilator were removed, and then a sample of fluid was aspirated through the 4 [place]  outer sheath. Bentson wire was passed into the collecting system and the sheath removed. Ten French dilation of the soft tissues was performed. Using modified Seldinger technique, a 10 French pigtail catheter drain was placed over the Bentson wire. Wire and inner stiffener removed, and the pigtail was formed in the collecting system.  Small amount of contrast confirmed position of the catheter. Patient tolerated the procedure well and remained hemodynamically stable throughout. No complications were encountered and no significant blood loss encountered The patient was then moved into a supine position to proceed with temporary hemodialysis catheter. The right neck was prepped with chlorhexidine in a sterile fashion, and a sterile drape was applied covering the operative field. Maximum barrier sterile technique with sterile gowns and gloves were used for the procedure. A timeout was performed prior to the initiation of the procedure A micropuncture kit was utilized to access the right internal jugular vein under direct, real-time ultrasound guidance after the overlying soft tissues were anesthetized with 1% lidocaine with epinephrine. Ultrasound image documentation was performed. The microwire was kinked to measure appropriate catheter length. A guidewire was advanced to the level of the IVC. A 15 cm hemodialysis catheter was then placed over the wire. Final catheter positioning was confirmed and documented with a spot radiographic image. The catheter aspirates and flushes normally. The catheter was flushed with appropriate volume heparin dwells. Dressings were applied. The patient tolerated the procedure well without immediate post procedural complication. IMPRESSION: Status post image guided left percutaneous nephrostomy, as well as same session image guided temporary hemodialysis catheter at the right IJ. Catheter may be converted if needed Signed, Dulcy Fanny. Dellia Nims, RPVI [name] Electronically Signed   By: Corrie Mckusick D.O.   On: 04/20/2021 15:17   IR US Guide Vasc Access Right  Result Date: 04/20/2021 INDICATION: 75 year old male with spontaneous retroperitoneal hemorrhage, acute renal failure, with left-sided ureteral obstruction EXAM: ULTRASOUND-GUIDED LEFT PERCUTANEOUS  NEPHROSTOMY IMAGE GUIDED TEMPORARY HEMODIALYSIS CATHETER COMPARISON:  CT 04/19/2021 MEDICATIONS: 2 g Rocephin; The antibiotic was administered in an appropriate time frame prior to skin puncture. ANESTHESIA/SEDATION: Fentanyl 50 mcg IV; Versed 0 mg IV Moderate Sedation Time:  0 The patient was continuously monitored during the procedure by the interventional radiology nurse under my direct supervision. CONTRAST:  71mL OMNIPAQUE IOHEXOL 300 MG/ML SOLN - administered into the collecting system(s) FLUOROSCOPY TIME:  Fluoroscopy Time: 2 minutes 12 seconds (14 mGy). COMPLICATIONS: None PROCEDURE: Informed written consent was obtained from the patient's family after a thorough discussion of the procedural risks, benefits and alternatives, regarding image guided percutaneous nephrostomy. All questions were addressed. Informed written consent was also obtained from the patient's family after a discussion of the risks, benefits, and alternatives to treatment, regarding image guided hemodialysis catheter placement. Questions regarding the procedure were encouraged and answered. Patient positioned prone position on the fluoroscopy table. Ultrasound survey of the left flank was performed with images stored and sent to PACs. The patient was then prepped and draped in the usual sterile fashion. 1% lidocaine was used to anesthetize the skin and subcutaneous tissues for local anesthesia. A Chiba needle was then used to access a posterior inferior calyx with ultrasound guidance. With spontaneous urine returned through the needle, passage of an 018 micro wire into the collecting system was performed under fluoroscopy. A small incision was made with an 11 blade scalpel, and the needle was removed from the wire. An Accustick system was then advanced over the wire into the collecting  system under fluoroscopy. The metal stiffener and inner dilator were removed, and then a sample of fluid was aspirated through the 4 French outer sheath.  Bentson wire was passed into the collecting system and the sheath removed. Ten French dilation of the soft tissues was performed. Using modified Seldinger technique, a 10 French pigtail catheter drain was placed over the Bentson wire. Wire and inner stiffener removed, and the pigtail was formed in the collecting system. Small amount of contrast confirmed position of the catheter. Patient tolerated the procedure well and remained hemodynamically stable throughout. No complications were encountered and no significant blood loss encountered The patient was then moved into a supine position to proceed with temporary hemodialysis catheter. The right neck was prepped with chlorhexidine in a sterile fashion, and a sterile drape was applied covering the operative field. Maximum barrier sterile technique with sterile gowns and gloves were used for the procedure. A timeout was performed prior to the initiation of the procedure A micropuncture kit was utilized to access the right internal jugular vein under direct, real-time ultrasound guidance after the overlying soft tissues were anesthetized with 1% lidocaine with epinephrine. Ultrasound image documentation was performed. The microwire was kinked to measure appropriate catheter length. A guidewire was advanced to the level of the IVC. A 15 cm hemodialysis catheter was then placed over the wire. Final catheter positioning was confirmed and documented with a spot radiographic image. The catheter aspirates and flushes normally. The catheter was flushed with appropriate volume heparin dwells. Dressings were applied. The patient tolerated the procedure well without immediate post procedural complication. IMPRESSION: Status post image guided left percutaneous nephrostomy, as well as same session image guided temporary hemodialysis catheter at the right IJ. Catheter may be converted if needed Signed, Dulcy Fanny. Dellia Nims, RPVI [facility]  [facility] Electronically Signed   By: Corrie Mckusick D.O.   On: 04/20/2021 15:17   DG CHEST PORT 1 VIEW  Result Date: 04/19/2021 CLINICAL DATA:  75 year old male with shortness of breath. EXAM: PORTABLE CHEST 1 VIEW COMPARISON:  Portable chest 09/01/2020 and earlier. FINDINGS: Portable AP semi upright view at 0523 hours. Chronic cardiomegaly appears stable. Visualized tracheal air column is within normal limits. Chronic left chest AICD, cardiac valve replacement and sternotomy. Substantially lower lung volumes. No pneumothorax. Upper lung pulmonary vascularity appears normal. Patchy bibasilar opacity. Small pleural effusion(s) difficult to exclude. No air bronchograms. Negative visible bowel gas pattern. Stable visualized osseous structures. IMPRESSION: 1. Substantially lower lung volumes with bibasilar opacity, nonspecific but perhaps just atelectasis. Difficult to exclude small pleural effusions. 2. Cardiomegaly.  No acute pulmonary edema. Electronically Signed   By: Genevie Ann M.D.   On: 04/19/2021 08:06   CT RENAL STONE STUDY  Result Date: 04/19/2021 CLINICAL DATA:  Acute renal failure.  Decreased urine output. EXAM: CT ABDOMEN AND PELVIS WITHOUT CONTRAST TECHNIQUE: Multidetector CT imaging of the abdomen and pelvis was performed following the standard protocol without IV contrast. COMPARISON:  04/18/2021 FINDINGS: Lower chest: Subpleural atelectasis and consolidation noted within both lung bases. Cardiac enlargement. ICD leads are noted within the right ventricle and left atrial appendage. Hepatobiliary: No focal liver abnormality identified. Small stones are noted layering within the gallbladder. No gallbladder wall thickening or pericholecystic inflammatory fat stranding. No bile duct dilatation. Pancreas: No acute findings. Spleen: Normal in size without focal abnormality. Adrenals/Urinary Tract: Normal adrenal glands. Right kidney is unremarkable. Left-sided hydronephrosis and hydroureter  identified. I suspect this is secondary to mass effect from  left retroperitoneal hematoma. Urinary bladder is grossly unremarkable. Stomach/Bowel: Stomach appears normal. There are a few mildly increased loops of small bowel which measure up to 2.7 cm. No specific findings to suggest bowel obstruction with gas and stool noted throughout the colon up to the level of the rectum. Vascular/Lymphatic: Aortic atherosclerosis. No aneurysm. No abdominopelvic adenopathy. Reproductive: Prostate is unremarkable. Other: Large left retroperitoneal hematoma is again identified. Using the same measuring technique as the previous exam this measures 16.5 x 7.8 by 16.8 cm, image 53/6 and image 57/3. Layering high attenuation material is identified consistent with subacute to acute blood products. On the previous exam this measured 18.2 x 8.5 by 17.3 cm. Intramuscular hematoma involving the left iliopsoas muscle is stable from previous exam. Small amount of mixed attenuation fluid infiltration within the presacral soft tissue space appears similar. Musculoskeletal: There is multilevel superior endplate compression deformities within the lumbar spine which appear unchanged from the previous exam and remain indeterminate. Multilevel degenerative disc disease also noted. IMPRESSION: 1. Similar appearance of large left retroperitoneal hematoma. 2. Left-sided hydronephrosis and hydroureter identified. I suspect this is secondary to mass effect from left retroperitoneal hematoma. 3. Similar appearance of intramuscular hematoma involving the left iliopsoas muscle. 4. Aortic atherosclerosis. 5. Gallstones. Aortic Atherosclerosis (ICD10-I70.0). Electronically Signed   By: Kerby Moors M.D.   On: 04/19/2021 07:00   IR NEPHROSTOMY PLACEMENT LEFT  Result Date: 04/20/2021 INDICATION: 75 year old male with spontaneous retroperitoneal hemorrhage, acute renal failure, with left-sided ureteral obstruction EXAM: ULTRASOUND-GUIDED LEFT PERCUTANEOUS  NEPHROSTOMY IMAGE GUIDED TEMPORARY HEMODIALYSIS CATHETER COMPARISON:  CT 04/19/2021 MEDICATIONS: 2 g Rocephin; The antibiotic was administered in an appropriate time frame prior to skin puncture. ANESTHESIA/SEDATION: Fentanyl 50 mcg IV; Versed 0 mg IV Moderate Sedation Time:  0 The patient was continuously monitored during the procedure by the interventional radiology nurse under my direct supervision. CONTRAST:  85mL OMNIPAQUE IOHEXOL 300 MG/ML SOLN - administered into the collecting system(s) FLUOROSCOPY TIME:  Fluoroscopy Time: 2 minutes 12 seconds (14 mGy). COMPLICATIONS: None PROCEDURE: Informed written consent was obtained from the patient's family after a thorough discussion of the procedural risks, benefits and alternatives, regarding image guided percutaneous nephrostomy. All questions were addressed. Informed written consent was also obtained from the patient's family after a discussion of the risks, benefits, and alternatives to treatment, regarding image guided hemodialysis catheter placement. Questions regarding the procedure were encouraged and answered. Patient positioned prone position on the fluoroscopy table. Ultrasound survey of the left flank was performed with images stored and sent to PACs. The patient was then prepped and draped in the usual sterile fashion. 1% lidocaine was used to anesthetize the skin and subcutaneous tissues for local anesthesia. A Chiba needle was then used to access a posterior inferior calyx with ultrasound guidance. With spontaneous urine returned through the needle, passage of an 018 micro wire into the collecting system was performed under fluoroscopy. A small incision was made with an 11 blade scalpel, and the needle was removed from the wire. An Accustick system was then advanced over the wire into the collecting system under fluoroscopy. The metal stiffener and inner dilator were removed, and then a sample of fluid was aspirated through the 4 French outer sheath.  Bentson wire was passed into the collecting system and the sheath removed. Ten French dilation of the soft tissues was performed. Using modified Seldinger technique, a 10 French pigtail catheter drain was placed over the Bentson wire. Wire and inner stiffener removed, and the pigtail was formed in  the collecting system. Small amount of contrast confirmed position of the catheter. Patient tolerated the procedure well and remained hemodynamically stable throughout. No complications were encountered and no significant blood loss encountered The patient was then moved into a supine position to proceed with temporary hemodialysis catheter. The right neck was prepped with chlorhexidine in a sterile fashion, and a sterile drape was applied covering the operative field. Maximum barrier sterile technique with sterile gowns and gloves were used for the procedure. A timeout was performed prior to the initiation of the procedure A micropuncture kit was utilized to access the right internal jugular vein under direct, real-time ultrasound guidance after the overlying soft tissues were anesthetized with 1% lidocaine with epinephrine. Ultrasound image documentation was performed. The microwire was kinked to measure appropriate catheter length. A guidewire was advanced to the level of the IVC. A 15 cm hemodialysis catheter was then placed over the wire. Final catheter positioning was confirmed and documented with a spot radiographic image. The catheter aspirates and flushes normally. The catheter was flushed with appropriate volume heparin dwells. Dressings were applied. The patient tolerated the procedure well without immediate post procedural complication. IMPRESSION: Status post image guided left percutaneous nephrostomy, as well as same session image guided temporary hemodialysis catheter at the right IJ. Catheter may be converted if needed Signed, Dulcy Fanny. Dellia Nims, RPVI [facility]  [facility]iology Electronically Signed   By: Corrie Mckusick D.O.   On: 04/20/2021 15:17    Labs:  CBC: Recent Labs    04/19/21 0150 04/20/21 0158 04/20/21 2052 04/21/21 0440 04/21/21 1307  WBC 18.3* 13.9*  --  11.4* 10.7*  HGB 7.5* 5.8* 7.1* 6.9* 9.5*  HCT 23.7* 18.6* 22.9* 21.5* 29.3*  PLT 214 251  --  230 202    COAGS: Recent Labs    03/09/21 0241 03/09/21 1556 03/10/21 0111 03/11/21 0107 03/12/21 0123 04/08/21 0826 04/08/21 2044 04/19/21 0150 04/19/21 1548 04/20/21 0158 04/21/21 0440  INR  --    < > 1.3* 1.3*   < > 3.2*   < > 2.1* 1.5* 1.3* 1.6*  APTT 68*  --  93* 80*  --  47*  --   --   --   --   --    < > = values in this interval not displayed.    BMP: Recent Labs    08/25/20 1121 08/25/20 1419 09/22/20 1446 10/01/20 1157 10/14/20 1403 04/19/21 0150 04/19/21 0559 04/20/21 0158 04/21/21 0440  NA 144   < > 138 140   < > 134* 132* 134* 135  K 4.8   < > 5.1 4.6   < > 6.0* 5.6* 5.5* 4.5  CL 113*   < > 103 106   < > 100 100 101 99  CO2 18*   < > 18* 22   < > 21* 20* 22 22  GLUCOSE 132*   < > 106* 98   < > 115* 122* 119* 108*  BUN 30*   < > 42* 37*   < > 54* 59* 74* 89*  CALCIUM 8.8   < > 9.6 9.3   < > 8.6* 8.2* 8.5* 8.4*  CREATININE 1.76*   < > 2.20* 1.88*   < > 4.82* 5.12* 5.63* 5.07*  GFRNONAA 37*   < > 28* 34*   < > 12* 11* 10* 11*  GFRAA 42*  --  32* 39*  --   --   --   --   --    < > =  values in this interval not displayed.    LIVER FUNCTION TESTS: Recent Labs    01/22/21 1208 03/03/21 1600 03/10/21 0111 03/11/21 0107 03/12/21 0123 04/08/21 0826  BILITOT 0.5 1.0  --   --   --  0.6  AST 35 55*  --   --   --  22  ALT 43 46*  --   --   --  18  ALKPHOS 65 90  --   --   --  123  PROT 6.2* 6.3*  --   --   --  6.6  ALBUMIN 2.9* 3.4* 2.2* 2.2* 2.1* 3.2*    Assessment and Plan:  L PCN placed in IR 8/1 Clean, dry and intact Will follow Plan per urology  Electronically Signed: Tyson Alias, NP 04/21/2021, 1:50 PM   I spent a total  of 15 Minutes at the the patient's bedside AND on the patient's [name] floor or unit, greater than 50% of which was counseling/coordinating care for L PCN

## 2021-04-21 NOTE — Plan of Care (Signed)
?  Problem: Clinical Measurements: ?Goal: Respiratory complications will improve ?Outcome: Progressing ?  ?Problem: Activity: ?Goal: Risk for activity intolerance will decrease ?Outcome: Progressing ?  ?Problem: Nutrition: ?Goal: Adequate nutrition will be maintained ?Outcome: Progressing ?  ?Problem: Coping: ?Goal: Level of anxiety will decrease ?Outcome: Progressing ?  ?

## 2021-04-21 NOTE — Progress Notes (Signed)
Nephrology Follow-Up Consult note   Assessment/Recommendations: Ryan [name] is a/an 75 y.o. 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  AKI now with likely ATN and some degree of obstruction related to left-sided hydronephrosis status post nephrostomy tube on 8/1.  Persistent signs of uremia today -Appreciate help from IR with temporary dialysis catheter and nephrostomy tube -Plan for dialysis today given clinical signs of uremia -Follow-up family meeting today for plans on further dialysis -Continue to monitor daily Cr, Dose meds for GFR -Monitor Daily I/Os, Daily weight  -Maintain MAP>65 for optimal renal perfusion.  -Avoid nephrotoxic medications including NSAIDs and Vanc/Zosyn combo  Left-sided hydronephrosis: This is secondary to retroperitoneal bleed causing obstruction.   -Appreciate help from IR and urology -Urology recommends conversion to antegrade stent in the future w/ IR -Follow nephrostomy tube output  Right critical lower limb ischemia: Continue dressing changes and management per primary team  Retroperitoneal bleed: Persistent anemia this morning. Ongoing anemia likely contributing to AKI.  Continue transfusions per primary team  Systolic heart failure: On midodrine.  Holding Lasix.  Monitor volume status closely  DM2: Continue sliding scale per primary team   Recommendations conveyed to primary service.    [facility] 04/21/2021 10:27 AM  ___________________________________________________________  CC: AKI on CKD  Interval History/Subjective: Patient states he feels okay today but admits to ongoing confusion.  No nausea or vomiting.  Hemoglobin remains low at 6.9 receiving 1 unit this morning.  Received  temporary dialysis catheter as well labs nephrostomy tube yesterday with IR.  Urine output was slightly higher at 250 cc but creatinine only minimally improved at 5.   Medications:  Current Facility-Administered Medications  Medication Dose Route Frequency Provider Last Rate Last Admin   (feeding supplement) PROSource Plus liquid 30 mL  30 mL Oral BID BM Charlynne Cousins, MD   30 mL at 04/20/21 1456   0.9 %  sodium chloride infusion (Manually program via Guardrails IV Fluids)   Intravenous Once Shela Leff, MD       0.9 %  sodium chloride infusion (Manually program via Guardrails IV Fluids)   Intravenous Once Aileen Fass, Tammi Klippel, MD       0.9 %  sodium chloride infusion   Intravenous Continuous Roney Jaffe, MD 65 mL/hr at 04/21/21 0600 Infusion Verify at 04/21/21 0600   0.9 %  sodium chloride infusion  100 mL Intravenous PRN Reesa Chew, MD       0.9 %  sodium chloride infusion  100 mL Intravenous PRN Reesa Chew, MD       alteplase (CATHFLO ACTIVASE) injection 2 mg  2 mg Intracatheter Once PRN Reesa Chew, MD       alum & mag hydroxide-simeth (MAALOX/MYLANTA) 200-200-20 MG/5ML suspension 30 mL  30 mL Oral Q4H PRN Charlynne Cousins, MD   30 mL at 04/17/21 0144   amiodarone (PACERONE) tablet 200 mg  200 mg Oral Daily Waynetta Sandy, MD   200 mg at 04/20/21 0844   atorvastatin (LIPITOR) tablet 40 mg  40 mg Oral q1800 Waynetta Sandy, MD   40 mg at 04/20/21 1623   B-complex with vitamin C tablet 1 tablet  1 tablet Oral Daily Charlynne Cousins, MD   1 tablet at 04/20/21  1456   bisacodyl (DULCOLAX) EC tablet 5 mg  5 mg Oral Daily PRN Waynetta Sandy, MD   5 mg at 04/17/21 0047   cefTRIAXone (ROCEPHIN) 1 g in sodium chloride 0.9 % 100 mL IVPB  1 g Intravenous Q24H Reesa Chew, MD       Chlorhexidine Gluconate Cloth 2 % PADS 6 each  6 each Topical Q0600 Charlynne Cousins, MD   6 each at 04/21/21 [phone]   docusate sodium  (COLACE) capsule 100 mg  100 mg Oral BID Waynetta Sandy, MD   100 mg at 04/20/21 2155   feeding supplement (NEPRO CARB STEADY) liquid 237 mL  237 mL Oral BID BM Charlynne Cousins, MD   237 mL at 04/20/21 1502   heparin injection 1,000 Units  1,000 Units Dialysis PRN Reesa Chew, MD       insulin aspart (novoLOG) injection 0-15 Units  0-15 Units Subcutaneous TID WC Waynetta Sandy, MD   2 Units at 04/19/21 1125   lidocaine (PF) (XYLOCAINE) 1 % injection 5 mL  5 mL Intradermal PRN Reesa Chew, MD       lidocaine-prilocaine (EMLA) cream 1 application  1 application Topical PRN Reesa Chew, MD       midodrine (PROAMATINE) tablet 10 mg  10 mg Oral TID WC Charlynne Cousins, MD   10 mg at 04/21/21 0757   ondansetron (ZOFRAN) injection 4 mg  4 mg Intravenous Q6H PRN Waynetta Sandy, MD   4 mg at 04/17/21 1611   ondansetron (ZOFRAN) tablet 4 mg  4 mg Oral Q6H PRN Waynetta Sandy, MD   4 mg at 04/16/21 1212   pantoprazole (PROTONIX) EC tablet 40 mg  40 mg Oral Daily Waynetta Sandy, MD   40 mg at 04/20/21 [id number]   pentafluoroprop-tetrafluoroeth (GEBAUERS) aerosol 1 application  1 application Topical PRN Reesa Chew, MD       phytonadione (VITAMIN K) SQ injection 2 mg  2 mg Subcutaneous Once Aileen Fass, Tammi Klippel, MD       polyethylene glycol (MIRALAX / Floria Raveling) packet 17 g  17 g Oral Daily PRN Vernelle Emerald, MD   17 g at 04/16/21 2147   sodium chloride flush (NS) 0.9 % injection 3 mL  3 mL Intravenous Q12H Waynetta Sandy, MD   3 mL at 04/20/21 2155   sodium chloride flush (NS) 0.9 % injection 3 mL  3 mL Intravenous PRN Waynetta Sandy, MD          Review of Systems: 10 systems reviewed and negative except per interval history/subjective  Physical Exam: Vitals:   04/21/21 1000 04/21/21 1015  BP: 137/61 140/60  Pulse: 85 85  Resp: 16 17  Temp: 99.2 F (37.3 C) 99.6 F (37.6 C)  SpO2:     Total  I/O In: 630 [Blood:630] Out: -   Intake/Output Summary (Last 24 hours) at 04/21/2021 1027 Last data filed at 04/21/2021 1000 Gross per 24 hour  Intake 2769.93 ml  Output 265 ml  Net 2504.93 ml   Constitutional: Ill-appearing, lying in bed, no distress ENMT: ears and nose without scars or lesions, MMM CV: normal rate, trace edema in the lower extremities Respiratory: Bilateral chest rise with no increased work of breathing Gastrointestinal: soft, non-tender, no palpable masses or hernias Skin: no visible lesions or rashes Psych: Awake, alert, answers questions appropriately but does admit to some confusion   Test Results I personally reviewed new  and old clinical labs and radiology tests Lab Results  Component Value Date   NA 135 04/21/2021   K 4.5 04/21/2021   CL 99 04/21/2021   CO2 22 04/21/2021   BUN 89 (H) 04/21/2021   CREATININE 5.07 (H) 04/21/2021   CALCIUM 8.4 (L) 04/21/2021   ALBUMIN 3.2 (L) 04/08/2021   PHOS 5.9 (H) 04/18/2021

## 2021-04-21 NOTE — Progress Notes (Signed)
Daily Progress Note   Patient Name: Ryan [name]       Date: 04/21/2021 DOB: 1945/11/26  Age: 75 y.o. MRN#: [id number] Attending Physician: Charlynne Cousins, MD Primary Care Physician: [name], MD Admit Date: 04/08/2021  Reason for Consultation/Follow-up: Establishing goals of care  Subjective: Patient was in HD when we met at bedside. He was lethargic but in NAD. He is receiving HD as well as 3 units of RBCs in HD this AM. He was concerned that he thought he had met me before and that everyone was looking the same to him.   Length of Stay: 13  Physical Exam HENT:     Head: Normocephalic.  Cardiovascular:     Rate and Rhythm: Normal rate.  Pulmonary:     Effort: Pulmonary effort is normal.  Abdominal:     Palpations: Abdomen is soft.  Neurological:     Mental Status: He is alert.  Psychiatric:     Comments: Patient seemed mildly confused while in HD but knew who he was and where he was            Vital Signs: BP 137/61 (BP Location: Right Arm)   Pulse 91   Temp 99 F (37.2 C) (Oral)   Resp 18   Ht 6' (1.829 m)   Wt 85.4 kg   SpO2 93%   BMI 25.53 kg/m  SpO2: SpO2: 93 % O2 Device: O2 Device: Room Air O2 Flow Rate: O2 Flow Rate (L/min): 2 L/min   Palliative Care Assessment & Plan   HPI: 75 y.o. male  with past medical history of atrial fibrillation on Coumadin, mitral valve replacement, chronic systolic heart failure with an AICD, essential hypertension, peripheral vascular disease, also history of peripheral vascular bypass with an ongoing wound infection admitted on 04/08/2021 for wound debridement. 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 Dropping hgb requiring transfusion - CT 7/30 revealed retroperitoneal  hemorrhage. Vitamin K and FFP ordered along with dc of anticoagulation. PMT consulted to discuss [place] with family.  Assessment: I have reviewed medical records including EPIC notes, labs and imaging, received report from the team, assessed the patient and then met with the patient's son [name] to discuss diagnosis prognosis, GOC, EOL wishes, disposition and options.  [name] reports he was very worried when he received a phone call from Hospice. I asked who called from Hospice and he said he thought we were Hospice. I educated the patient that Palliative Medicine as specialized medical care for people living with serious illness. It focuses on providing relief from the symptoms and stress of a serious illness. The goal is to improve quality of life for both the patient and the family.  We discussed a brief life review of the patient. [name] conveyed that his wife had ongoing, honest discussions with the patient and that the patient has expressed he would like full scope/full code.   We discussed patient's current illness and what it means in the larger context of patient's on-going co-morbidities.  Natural disease trajectory and expectations at EOL were discussed. I attempted to elicit values and goals of care important to the patient  and the patient's son.  I shared the difference between aggressive medical intervention and comfort care in light of the patient's current goal of full code/full scope care.   Advance directives and concepts specific to code status were explained. Discussed with patient/family the importance of continued conversation with family and the medical providers regarding overall plan of care and treatment options, ensuring decisions are within the context of the patient's values and GOCs.    Questions and concerns were addressed. The family was encouraged to call with questions or concerns.    Recommendations/Plan: Continue all aggressive measures Full code/full scope    Code Status: Full code  Prognosis:  Unable to determine  Discharge Planning: To Be Determined  Care plan was discussed with patient and patient's son [name]  Thank you for allowing the Palliative Medicine Team to assist in the care of this patient.   Wadie Lessen NP  Palliative Medicine Team Team Phone # [phone] Pager [phone]  Present and in agreement with this assessment and plan along with Annia Friendly NP      Total Time 35 minutes Prolonged Time Billed  no

## 2021-04-21 NOTE — Progress Notes (Signed)
[facility] PROGRESS NOTE    Progress Note  Ryan [name]  VOZ:[id number] DOB: 06-15-1946 DOA: 04/08/2021 PCP: [name], MD     Brief Narrative:   Ryan [name] is an 75 y.o. 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  Scheduled to have debridement on 04/08/2021 but due to an INR of 3.2 surgery was postponed.  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  He has completed his course of antibiotics in house last day was 04/13/2019.  He developed a retroperitoneal hematoma with left kidney displacement and hypotension developed ATN and hyperkalemia, nephrology was consulted recommended renal replacement therapy, urology was consulted due to his left hydronephrosis would like recommended a left nephrostomy tube   Assessment/Plan:   Right critical lower limb ischemia [facility]): Surgery rec continue dressing changes and wound care.   Wound VAC to be changed by wound care nurse. He also recommended to keep in placement for 2 weeks postprocedure ([id number]) follow-up with them as an outpatient. He will be discharged with wound VAC to skilled nursing facility.  New retroperitoneal hematoma/acute blood loss anemia: CT scan of the abdomen and pelvis showed new retroperitoneal hematoma he was given vitamin K and fresh frozen plasma, as his INR 1.3 .   INR 1.6 this morning is trending up we will go ahead and give vitamin K. He is status post 4 units of packed red blood cells, hemoglobin this morning is 6.9 we will go ahead and transfuse 2 unit of packed red blood cells  ATN on chronic kidney disease  stage III: In the setting of hypotension, retroperitoneal bleed causing mechanical displacement of the left kidney and anemia. Renal was consulted as well as urology they both recommended percutaneous left nephrostomy tube placement for left hydronephrosis and renal replacement therapy He is status post right IJ hemodialysis catheter placement and percutaneous left nephrostomy tube placed on 04/21/2019  Junctional rhythm due to hyperkalemia: Likely due to intra-abdominal bleeding, he was given calcium, insulin and dextrose as well with Lokelma and his potassium is slowly coming down.  Chronic systolic heart failure (HCC) Hold Lasix.  Cont.midodrine. Has a follow-up appointment with the advance heart failure team to rule out multiple myeloma question amyloid as an outpatient.  Permanent atrial fibrillation/Status post mitral valve replacement with bioprosthetic valve: Bioprosthetic valve. Hold heparin and Coumadin.  Diabetes mellitus type 2: With an A1c of 5.4 continue sliding scale insulin, will control blood glucose.  Essential HTN: Cont.midodrine, continue amiodarone blood pressure is elevated this morning. We will continue to monitor closely.  Blood pressure currently be elevated due to pain.  Hyperlipidemia: Continue statins.  Ethics/goals of care: Palliative Care is being consulted   DVT prophylaxis: Hold IV heparin and Coumadin, His anticoagulation has been reversed. Family Communication: None Status is: Inpatient  Remains inpatient appropriate because:Hemodynamically unstable  Dispo: The patient is from: Home              Anticipated d/c is to: Home              Patient currently is not medically stable to d/c.   Difficult to place patient No    Code Status:     Code Status Orders  (  From admission, onward)           Start     Ordered   04/08/21 1211  Full code  Continuous        04/08/21 1212           Code Status History     Date Active Date  Inactive Code Status Order ID Comments User Context   03/06/2021 1816 03/13/2021 0325 Full Code [id number]  Ortencia Kick Inpatient   02/25/2021 1202 02/25/2021 2000 Full Code [id number]  Cherre Robins, MD Inpatient   01/30/2021 1513 01/30/2021 2320 Full Code [id number]  Larey Dresser, MD Inpatient   08/26/2020 1818 09/06/2020 2203 Full Code [id number]  Ledora Bottcher, [place] Inpatient   06/11/2016 1627 06/12/2016 1608 Full Code [id number]  Constance Haw, MD Inpatient   08/30/2015 0114 08/30/2015 1540 Full Code [id number]  Edwin Dada, MD Inpatient   09/29/2012 1803 10/06/2012 2104 Full Code [id number]  Cephus Richer, RN Inpatient   09/26/2012 1331 09/29/2012 1803 Full Code [id number]  Lenox Ahr, RN Inpatient         IV Access:   Peripheral IV   Procedures and diagnostic studies:   IR Fluoro Guide CV Line Right  Result Date: 04/20/2021 INDICATION: 75 year old male with spontaneous retroperitoneal hemorrhage, acute renal failure, with left-sided ureteral obstruction EXAM: ULTRASOUND-GUIDED LEFT PERCUTANEOUS NEPHROSTOMY IMAGE GUIDED TEMPORARY HEMODIALYSIS CATHETER COMPARISON:  CT 04/19/2021 MEDICATIONS: 2 g Rocephin; The antibiotic was administered in an appropriate time frame prior to skin puncture. ANESTHESIA/SEDATION: Fentanyl 50 mcg IV; Versed 0 mg IV Moderate Sedation Time:  0 The patient was continuously monitored during the procedure by the interventional radiology nurse under my direct supervision. CONTRAST:  74m OMNIPAQUE IOHEXOL 300 MG/ML SOLN - administered into the collecting system(s) FLUOROSCOPY TIME:  Fluoroscopy Time: 2 minutes 12 seconds (14 mGy). COMPLICATIONS: None PROCEDURE: Informed written consent was obtained from the patient's family after a thorough discussion of the procedural risks, benefits and alternatives, regarding image guided percutaneous nephrostomy. All questions were addressed. Informed written consent was also obtained from the  patient's family after a discussion of the risks, benefits, and alternatives to treatment, regarding image guided hemodialysis catheter placement. Questions regarding the procedure were encouraged and answered. Patient positioned prone position on the fluoroscopy table. Ultrasound survey of the left flank was performed with images stored and sent to PACs. The patient was then prepped and draped in the usual sterile fashion. 1% lidocaine was used to anesthetize the skin and subcutaneous tissues for local anesthesia. A Chiba needle was then used to access a posterior inferior calyx with ultrasound guidance. With spontaneous urine returned through the needle, passage of an 018 micro wire into the collecting system was performed under fluoroscopy. A small incision was made with an 11 blade scalpel, and the needle was removed from the wire. An Accustick system was then advanced over the wire into the collecting system under fluoroscopy. The metal stiffener and inner dilator were removed, and then a sample of fluid was aspirated through the 4 French outer sheath. Bentson wire was passed into the collecting system and the sheath removed. Ten French dilation of the soft tissues was performed. Using modified Seldinger technique, a 10 French pigtail catheter drain was placed over the Bentson wire. Wire and inner stiffener removed, and the pigtail was formed in the collecting system. Small amount of contrast confirmed position of the catheter. Patient tolerated the procedure well and remained hemodynamically stable throughout. No complications were  encountered and no significant blood loss encountered The patient was then moved into a supine position to proceed with temporary hemodialysis catheter. The right neck was prepped with chlorhexidine in a sterile fashion, and a sterile drape was applied covering the operative field. Maximum barrier sterile technique with sterile gowns and gloves were used for the procedure. A timeout  was performed prior to the initiation of the procedure A micropuncture kit was utilized to access the right internal jugular vein under direct, real-time ultrasound guidance after the overlying soft tissues were anesthetized with 1% lidocaine with epinephrine. Ultrasound image documentation was performed. The microwire was kinked to measure appropriate catheter length. A guidewire was advanced to the level of the IVC. A 15 cm hemodialysis catheter was then placed over the wire. Final catheter positioning was confirmed and documented with a spot radiographic image. The catheter aspirates and flushes normally. The catheter was flushed with appropriate volume heparin dwells. Dressings were applied. The patient tolerated the procedure well without immediate post procedural complication. IMPRESSION: Status post image guided left percutaneous nephrostomy, as well as same session image guided temporary hemodialysis catheter at the right IJ. Catheter may be converted if needed Signed, Dulcy Fanny. Dellia Nims, RPVI [facility] Radiology Electronically Signed   By: Corrie Mckusick D.O.   On: 04/20/2021 15:17   IR US Guide Vasc Access Right  Result Date: 04/20/2021 INDICATION: 75 year old male with spontaneous retroperitoneal hemorrhage, acute renal failure, with left-sided ureteral obstruction EXAM: ULTRASOUND-GUIDED LEFT PERCUTANEOUS NEPHROSTOMY IMAGE GUIDED TEMPORARY HEMODIALYSIS CATHETER COMPARISON:  CT 04/19/2021 MEDICATIONS: 2 g Rocephin; The antibiotic was administered in an appropriate time frame prior to skin puncture. ANESTHESIA/SEDATION: Fentanyl 50 mcg IV; Versed 0 mg IV Moderate Sedation Time:  0 The patient was continuously monitored during the procedure by the interventional radiology nurse under my direct supervision. CONTRAST:  47m OMNIPAQUE IOHEXOL 300 MG/ML SOLN - administered into the collecting system(s) FLUOROSCOPY TIME:  Fluoroscopy Time: 2 minutes 12  seconds (14 mGy). COMPLICATIONS: None PROCEDURE: Informed written consent was obtained from the patient's family after a thorough discussion of the procedural risks, benefits and alternatives, regarding image guided percutaneous nephrostomy. All questions were addressed. Informed written consent was also obtained from the patient's family after a discussion of the risks, benefits, and alternatives to treatment, regarding image guided hemodialysis catheter placement. Questions regarding the procedure were encouraged and answered. Patient positioned prone position on the fluoroscopy table. Ultrasound survey of the left flank was performed with images stored and sent to PACs. The patient was then prepped and draped in the usual sterile fashion. 1% lidocaine was used to anesthetize the skin and subcutaneous tissues for local anesthesia. A Chiba needle was then used to access a posterior inferior calyx with ultrasound guidance. With spontaneous urine returned through the needle, passage of an 018 micro wire into the collecting system was performed under fluoroscopy. A small incision was made with an 11 blade scalpel, and the needle was removed from the wire. An Accustick system was then advanced over the wire into the collecting system under fluoroscopy. The metal stiffener and inner dilator were removed, and then a sample of fluid was aspirated through the 4 French outer sheath. Bentson wire was passed into the collecting system and the sheath removed. Ten French dilation of the soft tissues was performed. Using modified Seldinger technique, a 10 French pigtail catheter drain was placed over the Bentson wire. Wire and inner stiffener removed, and the pigtail was formed in the collecting  system. Small amount of contrast confirmed position of the catheter. Patient tolerated the procedure well and remained hemodynamically stable throughout. No complications were encountered and no significant blood loss encountered The  patient was then moved into a supine position to proceed with temporary hemodialysis catheter. The right neck was prepped with chlorhexidine in a sterile fashion, and a sterile drape was applied covering the operative field. Maximum barrier sterile technique with sterile gowns and gloves were used for the procedure. A timeout was performed prior to the initiation of the procedure A micropuncture kit was utilized to access the right internal jugular vein under direct, real-time ultrasound guidance after the overlying soft tissues were anesthetized with 1% lidocaine with epinephrine. Ultrasound image documentation was performed. The microwire was kinked to measure appropriate catheter length. A guidewire was advanced to the level of the IVC. A 15 cm hemodialysis catheter was then placed over the wire. Final catheter positioning was confirmed and documented with a spot radiographic image. The catheter aspirates and flushes normally. The catheter was flushed with appropriate volume heparin dwells. Dressings were applied. The patient tolerated the procedure well without immediate post procedural complication. IMPRESSION: Status post image guided left percutaneous nephrostomy, as well as same session image guided temporary hemodialysis catheter at the right IJ. Catheter may be converted if needed Signed, Dulcy Fanny. Dellia Nims, RPVI [facility] Radiology Electronically Signed   By: Corrie Mckusick D.O.   On: 04/20/2021 15:17   IR NEPHROSTOMY PLACEMENT LEFT  Result Date: 04/20/2021 INDICATION: 75 year old male with spontaneous retroperitoneal hemorrhage, acute renal failure, with left-sided ureteral obstruction EXAM: ULTRASOUND-GUIDED LEFT PERCUTANEOUS NEPHROSTOMY IMAGE GUIDED TEMPORARY HEMODIALYSIS CATHETER COMPARISON:  CT 04/19/2021 MEDICATIONS: 2 g Rocephin; The antibiotic was administered in an appropriate time frame prior to skin puncture. ANESTHESIA/SEDATION: Fentanyl 50  mcg IV; Versed 0 mg IV Moderate Sedation Time:  0 The patient was continuously monitored during the procedure by the interventional radiology nurse under my direct supervision. CONTRAST:  66m OMNIPAQUE IOHEXOL 300 MG/ML SOLN - administered into the collecting system(s) FLUOROSCOPY TIME:  Fluoroscopy Time: 2 minutes 12 seconds (14 mGy). COMPLICATIONS: None PROCEDURE: Informed written consent was obtained from the patient's family after a thorough discussion of the procedural risks, benefits and alternatives, regarding image guided percutaneous nephrostomy. All questions were addressed. Informed written consent was also obtained from the patient's family after a discussion of the risks, benefits, and alternatives to treatment, regarding image guided hemodialysis catheter placement. Questions regarding the procedure were encouraged and answered. Patient positioned prone position on the fluoroscopy table. Ultrasound survey of the left flank was performed with images stored and sent to PACs. The patient was then prepped and draped in the usual sterile fashion. 1% lidocaine was used to anesthetize the skin and subcutaneous tissues for local anesthesia. A Chiba needle was then used to access a posterior inferior calyx with ultrasound guidance. With spontaneous urine returned through the needle, passage of an 018 micro wire into the collecting system was performed under fluoroscopy. A small incision was made with an 11 blade scalpel, and the needle was removed from the wire. An Accustick system was then advanced over the wire into the collecting system under fluoroscopy. The metal stiffener and inner dilator were removed, and then a sample of fluid was aspirated through the 4 French outer sheath. Bentson wire was passed into the collecting system and the sheath removed. Ten French dilation of the soft tissues was performed. Using modified Seldinger technique, a 10 French pigtail  catheter drain was placed over the Bentson  wire. Wire and inner stiffener removed, and the pigtail was formed in the collecting system. Small amount of contrast confirmed position of the catheter. Patient tolerated the procedure well and remained hemodynamically stable throughout. No complications were encountered and no significant blood loss encountered The patient was then moved into a supine position to proceed with temporary hemodialysis catheter. The right neck was prepped with chlorhexidine in a sterile fashion, and a sterile drape was applied covering the operative field. Maximum barrier sterile technique with sterile gowns and gloves were used for the procedure. A timeout was performed prior to the initiation of the procedure A micropuncture kit was utilized to access the right internal jugular vein under direct, real-time ultrasound guidance after the overlying soft tissues were anesthetized with 1% lidocaine with epinephrine. Ultrasound image documentation was performed. The microwire was kinked to measure appropriate catheter length. A guidewire was advanced to the level of the IVC. A 15 cm hemodialysis catheter was then placed over the wire. Final catheter positioning was confirmed and documented with a spot radiographic image. The catheter aspirates and flushes normally. The catheter was flushed with appropriate volume heparin dwells. Dressings were applied. The patient tolerated the procedure well without immediate post procedural complication. IMPRESSION: Status post image guided left percutaneous nephrostomy, as well as same session image guided temporary hemodialysis catheter at the right IJ. Catheter may be converted if needed Signed, Dulcy Fanny. Dellia Nims, RPVI [facility] Radiology Electronically Signed   By: Corrie Mckusick D.O.   On: 04/20/2021 15:17     Medical Consultants:   None.   Subjective:    Ryan [name] not hungry relate he is tired.  Objective:    Vitals:    04/20/21 2300 04/21/21 0421 04/21/21 0550 04/21/21 0622  BP: (!) 119/52 95/78 (!) 110/52 (!) 111/47  Pulse: 99 76 79 81  Resp: _0 Temp: 99.6 F (37.6 C) 99.6 F (37.6 C) 99.5 F (37.5 C) 99.4 F (37.4 C)  TempSrc: Axillary Axillary Axillary Oral  SpO2: 100% 93% 95% 95%  Weight:  86.3 kg    Height:       SpO2: 95 % O2 Flow Rate (L/min): 2 L/min   Intake/Output Summary (Last 24 hours) at 04/21/2021 0814 Last data filed at 04/21/2021 3817 Gross per 24 hour  Intake 2139.93 ml  Output 265 ml  Net 1874.93 ml    Filed Weights   04/19/21 0309 04/20/21 0613 04/21/21 0421  Weight: 80.3 kg 84.8 kg 86.3 kg    Exam: General exam: In no acute distress. Respiratory system: Good air movement and clear to auscultation right IJ HD catheter in place Cardiovascular system: S1 & S2 heard, RRR. No JVD. Gastrointestinal system: Abdomen is nondistended, soft and nontender.  Skin: No rashes, lesions or ulcers Psychiatry: Judgement and insight appear normal. Mood & affect appropriate.  Data Reviewed:    Labs: Basic Metabolic Panel: Recent Labs  Lab 04/18/21 2207 04/19/21 0150 04/19/21 0559 04/20/21 0158 04/21/21 0440  NA 135 134* 132* 134* 135  K 6.1* 6.0* 5.6* 5.5* 4.5  CL 102 100 100 101 99  CO2 22 21* 20* 22 22  GLUCOSE 137* 115* 122* 119* 108*  BUN 51* 54* 59* 74* 89*  CREATININE 4.64* 4.82* 5.12* 5.63* 5.07*  CALCIUM 8.1* 8.6* 8.2* 8.5* 8.4*  PHOS 5.9*  --   --   --   --     GFR  Estimated Creatinine Clearance: 13.4 mL/min (A) (by C-G formula based on SCr of 5.07 mg/dL (H)). Liver Function Tests: No results for input(s): AST, ALT, ALKPHOS, BILITOT, PROT, ALBUMIN in the last 168 hours. No results for input(s): LIPASE, AMYLASE in the last 168 hours. No results for input(s): AMMONIA in the last 168 hours. Coagulation profile Recent Labs  Lab 04/18/21 2207 04/19/21 0150 04/19/21 1548 04/20/21 0158 04/21/21 0440  INR 2.0* 2.1* 1.5* 1.3* 1.6*    COVID-19  Labs  No results for input(s): DDIMER, FERRITIN, LDH, CRP in the last 72 hours.  Lab Results  Component Value Date   SARSCOV2NAA NEGATIVE 04/08/2021   SARSCOV2NAA NEGATIVE 03/03/2021   SARSCOV2NAA NEGATIVE 01/28/2021   [place] NEGATIVE 08/26/2020    CBC: Recent Labs  Lab 04/18/21 0106 04/18/21 2019 04/19/21 0150 04/20/21 0158 04/20/21 2052 04/21/21 0440  WBC 14.7* 19.1* 18.3* 13.9*  --  11.4*  HGB 6.4* 7.0* 7.5* 5.8* 7.1* 6.9*  HCT 20.8* 22.2* 23.7* 18.6* 22.9* 21.5*  MCV 78.2* 78.4* 79.8* 79.8*  --  80.8  PLT 300 275 214 251  --  230    Cardiac Enzymes: Recent Labs  Lab 04/18/21 2207 04/20/21 0158  CKTOTAL 3,479* 2,716*    BNP (last 3 results) Recent Labs    08/25/20 1121 03/27/21 1123  PROBNP 21,395* 3,146*    CBG: Recent Labs  Lab 04/20/21 0614 04/20/21 1106 04/20/21 1623 04/20/21 2117 04/21/21 0634  GLUCAP 119* 114* 115* 122* 108*    D-Dimer: No results for input(s): DDIMER in the last 72 hours. Hgb A1c: No results for input(s): HGBA1C in the last 72 hours. Lipid Profile: No results for input(s): CHOL, HDL, LDLCALC, TRIG, CHOLHDL, LDLDIRECT in the last 72 hours. Thyroid function studies: No results for input(s): TSH, T4TOTAL, T3FREE, THYROIDAB in the last 72 hours.  Invalid input(s): FREET3 Anemia work up: No results for input(s): VITAMINB12, FOLATE, FERRITIN, TIBC, IRON, RETICCTPCT in the last 72 hours. Sepsis Labs: Recent Labs  Lab 04/18/21 2019 04/18/21 2207 04/19/21 0150 04/20/21 0158 04/21/21 0440  WBC 19.1*  --  18.3* 13.9* 11.4*  LATICACIDVEN  --  1.6  --   --   --     Microbiology No results found for this or any previous visit (from the past 240 hour(s)).    Medications:    (feeding supplement) PROSource Plus  30 mL Oral BID BM   sodium chloride   Intravenous Once   amiodarone  200 mg Oral Daily   atorvastatin  40 mg Oral q1800   B-complex with vitamin C  1 tablet Oral Daily   Chlorhexidine Gluconate Cloth  6  each Topical Q0600   docusate sodium  100 mg Oral BID   feeding supplement (NEPRO CARB STEADY)  237 mL Oral BID BM   insulin aspart  0-15 Units Subcutaneous TID WC   midodrine  10 mg Oral TID WC   pantoprazole  40 mg Oral Daily   sodium chloride flush  3 mL Intravenous Q12H   Continuous Infusions:  sodium chloride 65 mL/hr at 04/21/21 0600   sodium chloride     sodium chloride     cefTRIAXone (ROCEPHIN)  IV        LOS: 13 days   [facility]  04/21/2021, 8:14 AM

## 2021-04-22 ENCOUNTER — Ambulatory Visit: Payer: Medicare HMO | Admitting: Physician Assistant

## 2021-04-22 ENCOUNTER — Inpatient Hospital Stay (HOSPITAL_COMMUNITY): Payer: Medicare HMO

## 2021-04-22 DIAGNOSIS — M7989 Other specified soft tissue disorders: Secondary | ICD-10-CM

## 2021-04-22 DIAGNOSIS — M79652 Pain in left thigh: Secondary | ICD-10-CM | POA: Diagnosis not present

## 2021-04-22 DIAGNOSIS — L8990 Pressure ulcer of unspecified site, unspecified stage: Secondary | ICD-10-CM | POA: Diagnosis present

## 2021-04-22 DIAGNOSIS — I70229 Atherosclerosis of native arteries of extremities with rest pain, unspecified extremity: Secondary | ICD-10-CM | POA: Diagnosis not present

## 2021-04-22 LAB — CBC
HCT: 29.7 % — ABNORMAL LOW (ref 39.0–52.0)
Hemoglobin: 9.4 g/dL — ABNORMAL LOW (ref 13.0–17.0)
MCH: 25.8 pg — ABNORMAL LOW (ref 26.0–34.0)
MCHC: 31.6 g/dL (ref 30.0–36.0)
MCV: 81.4 fL (ref 80.0–100.0)
Platelets: 207 10*3/uL (ref 150–400)
RBC: 3.65 MIL/uL — ABNORMAL LOW (ref 4.22–5.81)
RDW: 19.1 % — ABNORMAL HIGH (ref 11.5–15.5)
WBC: 9.5 10*3/uL (ref 4.0–10.5)
nRBC: 0 % (ref 0.0–0.2)

## 2021-04-22 LAB — BPAM RBC
Blood Product Expiration Date: 202208252359
Blood Product Expiration Date: 202208252359
Blood Product Expiration Date: 202208282359
Blood Product Expiration Date: 202208282359
Blood Product Expiration Date: 202208292359
Blood Product Expiration Date: 202208292359
Blood Product Expiration Date: 202208302359
ISSUE DATE / TIME: 202207300606
ISSUE DATE / TIME: 202207301402
ISSUE DATE / TIME: 202208010347
ISSUE DATE / TIME: 202208010826
ISSUE DATE / TIME: 202208020602
ISSUE DATE / TIME: 202208020917
ISSUE DATE / TIME: 202208020917
Unit Type and Rh: 6200
Unit Type and Rh: 6200
Unit Type and Rh: 6200
Unit Type and Rh: 6200
Unit Type and Rh: 6200
Unit Type and Rh: 6200
Unit Type and Rh: 6200

## 2021-04-22 LAB — TYPE AND SCREEN
ABO/RH(D): A POS
Antibody Screen: NEGATIVE
Unit division: 0
Unit division: 0
Unit division: 0
Unit division: 0
Unit division: 0
Unit division: 0
Unit division: 0

## 2021-04-22 LAB — BASIC METABOLIC PANEL
Anion gap: 10 (ref 5–15)
BUN: 58 mg/dL — ABNORMAL HIGH (ref 8–23)
CO2: 24 mmol/L (ref 22–32)
Calcium: 8.2 mg/dL — ABNORMAL LOW (ref 8.9–10.3)
Chloride: 103 mmol/L (ref 98–111)
Creatinine, Ser: 2.83 mg/dL — ABNORMAL HIGH (ref 0.61–1.24)
GFR, Estimated: 22 mL/min — ABNORMAL LOW (ref >60)
Glucose, Bld: 96 mg/dL (ref 70–99)
Potassium: 3.5 mmol/L (ref 3.5–5.1)
Sodium: 137 mmol/L (ref 135–145)

## 2021-04-22 LAB — GLUCOSE, CAPILLARY
Glucose-Capillary: 105 mg/dL — ABNORMAL HIGH (ref 70–99)
Glucose-Capillary: 122 mg/dL — ABNORMAL HIGH (ref 70–99)
Glucose-Capillary: 119 mg/dL — ABNORMAL HIGH (ref 70–99)
Glucose-Capillary: 140 mg/dL — ABNORMAL HIGH (ref 70–99)

## 2021-04-22 LAB — HEPATITIS B SURFACE ANTIBODY, QUANTITATIVE: Hep B S AB Quant (Post): >1000 m[IU]/mL (ref >9.9)

## 2021-04-22 LAB — PROTIME-INR
INR: 1.4 — ABNORMAL HIGH (ref 0.8–1.2)
Prothrombin Time: 17.5 seconds — ABNORMAL HIGH (ref 11.4–15.2)

## 2021-04-22 MED ORDER — TRAZODONE HCL 50 MG PO TABS
50.0000 mg | ORAL_TABLET | Freq: Every evening | ORAL | Status: DC | PRN
  Administered 2021-04-23 – 2021-05-17 (×7): 50 mg via ORAL
  Filled 2021-04-22 (×7): qty 1

## 2021-04-22 MED ORDER — SENNOSIDES-DOCUSATE SODIUM 8.6-50 MG PO TABS: 1.0000 | ORAL_TABLET | Freq: Every evening | ORAL | Status: DC | PRN

## 2021-04-22 MED ORDER — METOPROLOL TARTRATE 5 MG/5ML IV SOLN
5.0000 mg | INTRAVENOUS | Status: DC | PRN
  Administered 2021-04-24 – 2021-04-27 (×4): 5 mg via INTRAVENOUS
  Filled 2021-04-22 (×4): qty 5

## 2021-04-22 MED ORDER — DM-GUAIFENESIN ER 30-600 MG PO TB12
1.0000 | ORAL_TABLET | Freq: Two times a day (BID) | ORAL | Status: DC | PRN
  Administered 2021-04-27 – 2021-05-01 (×2): 1 via ORAL
  Filled 2021-04-22 (×2): qty 1

## 2021-04-22 MED ORDER — HYDRALAZINE HCL 20 MG/ML IJ SOLN: 10.0000 mg | INTRAMUSCULAR | Status: DC | PRN

## 2021-04-22 NOTE — Consult Note (Signed)
[place] Nurse wound follow up Wound type: surgical debridement to right heel.  Measurement:4 cmx 4 cm with Myriad dressing  sutured in place.  I changed xeroform dressing as it was saturated with dark brown effluent.   Wound bed: Dark brown with green sutured material in wound bed.  Drainage (amount, consistency, odor) minimal dark effluent with musty odor.  Periwound: Dry skin  Dressing procedure/placement/frequency: Xeroform to wound bed. Black foam to wound bed.  Bridged to dorsal foot.  Seal achieved at 125 mmHg.   Change Mon/Wed/Fri.  WOC Nurse wound follow up Wound type: New deep tissue injury to sacrum and upper gluteal folds bilaterally.  Measurement: sacrum: 2 cm x 2 cm intact maroon discoloration Left and right upper gluteal folds: 4 cm x 2.5 cm intact maroon discoloration All wounds are connected Wound RPZ:PSUGAY maroon discoloration Drainage (amount, consistency, odor) none  Periwound:dry skin Dressing procedure/placement/frequency:Cleanse sacral/buttocks wound with NS and pat dry.  Apply silicone sacral foam.  Change every three days.  Peel back foam and inspect wound each shift.  Will follow. Domenic Moras MSN, RN, FNP-BC [facility]ON Wound, Ostomy, Continence Nurse Pager [phone]

## 2021-04-22 NOTE — Progress Notes (Signed)
@  0430 Dr. Marlowe Sax, on-call for attending, text-paged regarding pt's new complaint of L Thigh pain. Exam reveals swollen and warm thigh. Pulses dopplerable and unchanged from prior assessment.  '@[id number]'$  above MD re-paged due to no response. Page promptly returned and order received for Doppler study in AM. Will continue to monitor and assess.

## 2021-04-22 NOTE — Progress Notes (Signed)
PROGRESS NOTE    Ryan [name]  GFQ:[id number] DOB: January 05, 1946 DOA: 04/08/2021 PCP: [name], MD   Brief Narrative:  75 y.o. 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  Scheduled to have debridement on 04/08/2021 but due to an INR of 3.2 surgery was postponed.  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  He has completed his course of antibiotics in house last day was 04/13/2019.  He developed a retroperitoneal hematoma with left kidney displacement and hypotension developed ATN and hyperkalemia, nephrology was consulted recommended renal replacement therapy, urology was consulted due to his left hydronephrosis would like recommended a left nephrostomy tube   Assessment & Plan:   Principal Problem:   Critical lower limb ischemia ([place]) Active Problems:   Chronic systolic heart failure ([place])   Status post mitral valve replacement with bioprosthetic valve   Diabetes mellitus ([place])   Essential hypertension   ICD (implantable cardioverter-defibrillator) in place   Stage 3 chronic kidney disease ([place])   Right critical lower limb ischemia [facility]): Surgery-recommends dressing changes, wound care.  Wound VAC in place.  Plans to DC him with wound VAC in place to SNF.  Follow-up outpatient LE dopplers neg for DVT   New retroperitoneal hematoma/acute blood loss anemia: -CT scan of the abdomen and pelvis showed new retroperitoneal hematoma he was given vitamin K and fresh frozen plasma, as his INR 1.3 closely monitor hemoglobin level.  Transfuse as needed.   ATN on chronic kidney disease stage III:  Creatinine peaked at 5.63 -Secondary to hypotension, retroperitoneal bleed - Creatinine significantly improved.  2.3 today. S/p HD 8/2 - Seen by nephro and urology.  Percutaneous left nephrostomy tube placement and right IJ HD placement 8/1  Junctional rhythm due to hyperkalemia: -Monitor electrolytes   Chronic systolic heart failure ([place]), EF 40-45% -Continue midodrine.  Lasix on hold.  Echo 03/04/2021-EF 40-45%, global hypokinesis.  Follow-up outpatient CHF   Permanent atrial fibrillation/Status post mitral valve replacement with bioprosthetic valve: Bioprosthetic valve. -Due to ongoing bleeding Coumadin and heparin on hold.   Diabetes mellitus type 2: -Hemoglobin A1c 5.4.  Insulin sliding scale and Accu-Cheks  Essential HTN: -Currently patient is on amiodarone and midodrine 10 mg 3 times daily  Hyperlipidemia: -Lipitor 40 mg daily   Ethics/goals of care: Palliative Care is being consulted    DVT prophylaxis:   No anticoagulation due to ongoing bleeding.  Unable to place SCD due to anatomy and wound care issues Code Status: Full code Family Communication:    Status is: Inpatient  Remains inpatient appropriate because:IV treatments appropriate due to intensity of illness or inability to take PO  Dispo: The patient is from: Home              Anticipated d/c is to: SNF              Patient currently is not medically stable to d/c.   Difficult to place patient No       Nutritional status  Nutrition Problem: Increased nutrient needs Etiology: post-op healing  Signs/Symptoms: estimated needs  Interventions: MVI, Juven, Magic cup, Premier Protein  Body mass index is 25.95 kg/m.  Pressure Injury 04/21/21 Coccyx Medial Stage 2 -  Partial thickness loss of dermis presenting as a shallow open injury with a red, pink wound bed without slough. (Active)  04/21/21 1255  Location: Coccyx  Location Orientation: Medial  Staging: Stage 2 -  Partial thickness loss of  dermis presenting as a shallow open injury with a red, pink wound bed without slough.  Wound Description (Comments):   Present on Admission:           Subjective: Feels slightly better in terms of his mentation and breathing. No othjer complaints at this time.   Review of Systems Otherwise negative except as per HPI, including: General: Denies fever, chills, night sweats or unintended weight loss. Resp: Denies cough, wheezing, shortness of breath. Cardiac: Denies chest pain, palpitations, orthopnea, paroxysmal nocturnal dyspnea. GI: Denies abdominal pain, nausea, vomiting, diarrhea or constipation GU: Denies dysuria, frequency, hesitancy or incontinence MS: Denies muscle aches, joint pain or swelling Neuro: Denies headache, neurologic deficits (focal weakness, numbness, tingling), abnormal gait Psych: Denies anxiety, depression, SI/HI/AVH Skin: Denies new rashes or lesions ID: Denies sick contacts, exotic exposures, travel  Examination:  General exam: Appears chronically ill Respiratory system: Clear to auscultation. Respiratory effort normal. Cardiovascular system: S1 & S2 heard, RRR. No JVD, murmurs, rubs, gallops or clicks.  1-2+ Bilateral Lower Extremity Edema Gastrointestinal system: Abdomen is nondistended, soft and nontender. No organomegaly or masses felt. Normal bowel sounds heard. Central nervous system: Alert and oriented. No focal neurological deficits. Extremities: Symmetric 5 x 5 power. Skin: No rashes, lesions or ulcers Psychiatry: Judgement and insight appear normal. Mood & affect appropriate.     Objective: Vitals:   04/21/21 2000 04/21/21 2341 04/22/21 0300 04/22/21 0745  BP:  136/60 135/69 131/66  Pulse: 78 78 77 78  Resp:  _0 Temp:  98.2 F (36.8 C) 98.4 F (36.9 C) 98.4 F (36.9 C)  TempSrc:  Oral Oral Oral  SpO2: (!) 89% 95% 93% 90%  Weight:   86.8 kg   Height:        Intake/Output Summary (Last 24 hours) at 04/22/2021 0912 Last data  filed at 04/22/2021 0450 Gross per 24 hour  Intake 2687.51 ml  Output -203 ml  Net 2890.51 ml   Filed Weights   04/21/21 0421 04/21/21 0815 04/22/21 0300  Weight: 86.3 kg 85.4 kg 86.8 kg     Data Reviewed:   CBC: Recent Labs  Lab 04/19/21 0150 04/20/21 0158 04/20/21 2052 04/21/21 0440 04/21/21 1307 04/22/21 0435  WBC 18.3* 13.9*  --  11.4* 10.7* 9.5  HGB 7.5* 5.8* 7.1* 6.9* 9.5* 9.4*  HCT 23.7* 18.6* 22.9* 21.5* 29.3* 29.7*  MCV 79.8* 79.8*  --  80.8 82.3 81.4  PLT 214 251  --  230 202 656   Basic Metabolic Panel: Recent Labs  Lab 04/18/21 2207 04/19/21 0150 04/19/21 0559 04/20/21 0158 04/21/21 0440 04/22/21 0435  NA 135 134* 132* 134* 135 137  K 6.1* 6.0* 5.6* 5.5* 4.5 3.5  CL 102 100 100 101 99 103  CO2 22 21* 20* _1 GLUCOSE 137* 115* 122* 119* 108* 96  BUN 51* 54* 59* 74* 89* 58*  CREATININE 4.64* 4.82* 5.12* 5.63* 5.07* 2.83*  CALCIUM 8.1* 8.6* 8.2* 8.5* 8.4* 8.2*  PHOS 5.9*  --   --   --   --   --    GFR: Estimated Creatinine Clearance: 24 mL/min (A) (by C-G formula based on SCr of 2.83 mg/dL (H)). Liver Function Tests: No results for input(s): AST, ALT, ALKPHOS,  BILITOT, PROT, ALBUMIN in the last 168 hours. No results for input(s): LIPASE, AMYLASE in the last 168 hours. No results for input(s): AMMONIA in the last 168 hours. Coagulation Profile: Recent Labs  Lab 04/19/21 0150 04/19/21 1548 04/20/21 0158 04/21/21 0440 04/22/21 0435  INR 2.1* 1.5* 1.3* 1.6* 1.4*   Cardiac Enzymes: Recent Labs  Lab 04/18/21 2207 04/20/21 0158  CKTOTAL 3,479* 2,716*   BNP (last 3 results) Recent Labs    08/25/20 1121 03/27/21 1123  PROBNP 21,395* 3,146*   HbA1C: No results for input(s): HGBA1C in the last 72 hours. CBG: Recent Labs  Lab 04/21/21 0634 04/21/21 1128 04/21/21 1626 04/21/21 2139 04/22/21 0614  GLUCAP 108* 96 122* 114* 105*   Lipid Profile: No results for input(s): CHOL, HDL, LDLCALC, TRIG, CHOLHDL, LDLDIRECT in the last 72  hours. Thyroid Function Tests: No results for input(s): TSH, T4TOTAL, FREET4, T3FREE, THYROIDAB in the last 72 hours. Anemia Panel: No results for input(s): VITAMINB12, FOLATE, FERRITIN, TIBC, IRON, RETICCTPCT in the last 72 hours. Sepsis Labs: Recent Labs  Lab 04/18/21 2207  LATICACIDVEN 1.6    Recent Results (from the past 240 hour(s))  Urine Culture     Status: None   Collection Time: 04/20/21 11:47 AM   Specimen: Urine, Clean Catch  Result Value Ref Range Status   Specimen Description URINE, CLEAN CATCH  Final   Special Requests NONE  Final   Culture   Final    NO GROWTH Performed at [facility], [address] [place], [place] [address]    Report Status 04/21/2021 FINAL  Final         Radiology Studies: IR Fluoro Guide CV Line Right  Result Date: 04/20/2021 INDICATION: 75 year old male with spontaneous retroperitoneal hemorrhage, acute renal failure, with left-sided ureteral obstruction EXAM: ULTRASOUND-GUIDED LEFT PERCUTANEOUS NEPHROSTOMY IMAGE GUIDED TEMPORARY HEMODIALYSIS CATHETER COMPARISON:  CT 04/19/2021 MEDICATIONS: 2 g Rocephin; The antibiotic was administered in an appropriate time frame prior to skin puncture. ANESTHESIA/SEDATION: Fentanyl 50 mcg IV; Versed 0 mg IV Moderate Sedation Time:  0 The patient was continuously monitored during the procedure by the interventional radiology nurse under my direct supervision. CONTRAST:  86mL OMNIPAQUE IOHEXOL 300 MG/ML SOLN - administered into the collecting system(s) FLUOROSCOPY TIME:  Fluoroscopy Time: 2 minutes 12 seconds (14 mGy). COMPLICATIONS: None PROCEDURE: Informed written consent was obtained from the patient's family after a thorough discussion of the procedural risks, benefits and alternatives, regarding image guided percutaneous nephrostomy. All questions were addressed. Informed written consent was also obtained from the patient's family after a discussion of the risks, benefits, and alternatives to  treatment, regarding image guided hemodialysis catheter placement. Questions regarding the procedure were encouraged and answered. Patient positioned prone position on the fluoroscopy table. Ultrasound survey of the left flank was performed with images stored and sent to PACs. The patient was then prepped and draped in the usual sterile fashion. 1% lidocaine was used to anesthetize the skin and subcutaneous tissues for local anesthesia. A Chiba needle was then used to access a posterior inferior calyx with ultrasound guidance. With spontaneous urine returned through the needle, passage of an 018 micro wire into the collecting system was performed under fluoroscopy. A small incision was made with an 11 blade scalpel, and the needle was removed from the wire. An Accustick system was then advanced over the wire into the collecting system under fluoroscopy. The metal stiffener and inner dilator were removed, and then a sample of fluid was aspirated through the 4  French outer sheath. Bentson wire was passed into the collecting system and the sheath removed. Ten French dilation of the soft tissues was performed. Using modified Seldinger technique, a 10 French pigtail catheter drain was placed over the Bentson wire. Wire and inner stiffener removed, and the pigtail was formed in the collecting system. Small amount of contrast confirmed position of the catheter. Patient tolerated the procedure well and remained hemodynamically stable throughout. No complications were encountered and no significant blood loss encountered The patient was then moved into a supine position to proceed with temporary hemodialysis catheter. The right neck was prepped with chlorhexidine in a sterile fashion, and a sterile drape was applied covering the operative field. Maximum barrier sterile technique with sterile gowns and gloves were used for the procedure. A timeout was performed prior to the initiation of the procedure A micropuncture kit was  utilized to access the right internal jugular vein under direct, real-time ultrasound guidance after the overlying soft tissues were anesthetized with 1% lidocaine with epinephrine. Ultrasound image documentation was performed. The microwire was kinked to measure appropriate catheter length. A guidewire was advanced to the level of the IVC. A 15 cm hemodialysis catheter was then placed over the wire. Final catheter positioning was confirmed and documented with a spot radiographic image. The catheter aspirates and flushes normally. The catheter was flushed with appropriate volume heparin dwells. Dressings were applied. The patient tolerated the procedure well without immediate post procedural complication. IMPRESSION: Status post image guided left percutaneous nephrostomy, as well as same session image guided temporary hemodialysis catheter at the right IJ. Catheter may be converted if needed Signed, Dulcy Fanny. Dellia Nims, RPVI [facility] Radiology Electronically Signed   By: Corrie Mckusick D.O.   On: 04/20/2021 15:17   IR US Guide Vasc Access Right  Result Date: 04/20/2021 INDICATION: 75 year old male with spontaneous retroperitoneal hemorrhage, acute renal failure, with left-sided ureteral obstruction EXAM: ULTRASOUND-GUIDED LEFT PERCUTANEOUS NEPHROSTOMY IMAGE GUIDED TEMPORARY HEMODIALYSIS CATHETER COMPARISON:  CT 04/19/2021 MEDICATIONS: 2 g Rocephin; The antibiotic was administered in an appropriate time frame prior to skin puncture. ANESTHESIA/SEDATION: Fentanyl 50 mcg IV; Versed 0 mg IV Moderate Sedation Time:  0 The patient was continuously monitored during the procedure by the interventional radiology nurse under my direct supervision. CONTRAST:  21mL OMNIPAQUE IOHEXOL 300 MG/ML SOLN - administered into the collecting system(s) FLUOROSCOPY TIME:  Fluoroscopy Time: 2 minutes 12 seconds (14 mGy). COMPLICATIONS: None PROCEDURE: Informed written consent was  obtained from the patient's family after a thorough discussion of the procedural risks, benefits and alternatives, regarding image guided percutaneous nephrostomy. All questions were addressed. Informed written consent was also obtained from the patient's family after a discussion of the risks, benefits, and alternatives to treatment, regarding image guided hemodialysis catheter placement. Questions regarding the procedure were encouraged and answered. Patient positioned prone position on the fluoroscopy table. Ultrasound survey of the left flank was performed with images stored and sent to PACs. The patient was then prepped and draped in the usual sterile fashion. 1% lidocaine was used to anesthetize the skin and subcutaneous tissues for local anesthesia. A Chiba needle was then used to access a posterior inferior calyx with ultrasound guidance. With spontaneous urine returned through the needle, passage of an 018 micro wire into the collecting system was performed under fluoroscopy. A small incision was made with an 11 blade scalpel, and the needle was removed from the wire. An Accustick system was then advanced over the wire into the  collecting system under fluoroscopy. The metal stiffener and inner dilator were removed, and then a sample of fluid was aspirated through the 4 French outer sheath. Bentson wire was passed into the collecting system and the sheath removed. Ten French dilation of the soft tissues was performed. Using modified Seldinger technique, a 10 French pigtail catheter drain was placed over the Bentson wire. Wire and inner stiffener removed, and the pigtail was formed in the collecting system. Small amount of contrast confirmed position of the catheter. Patient tolerated the procedure well and remained hemodynamically stable throughout. No complications were encountered and no significant blood loss encountered The patient was then moved into a supine position to proceed with temporary  hemodialysis catheter. The right neck was prepped with chlorhexidine in a sterile fashion, and a sterile drape was applied covering the operative field. Maximum barrier sterile technique with sterile gowns and gloves were used for the procedure. A timeout was performed prior to the initiation of the procedure A micropuncture kit was utilized to access the right internal jugular vein under direct, real-time ultrasound guidance after the overlying soft tissues were anesthetized with 1% lidocaine with epinephrine. Ultrasound image documentation was performed. The microwire was kinked to measure appropriate catheter length. A guidewire was advanced to the level of the IVC. A 15 cm hemodialysis catheter was then placed over the wire. Final catheter positioning was confirmed and documented with a spot radiographic image. The catheter aspirates and flushes normally. The catheter was flushed with appropriate volume heparin dwells. Dressings were applied. The patient tolerated the procedure well without immediate post procedural complication. IMPRESSION: Status post image guided left percutaneous nephrostomy, as well as same session image guided temporary hemodialysis catheter at the right IJ. Catheter may be converted if needed Signed, Dulcy Fanny. Dellia Nims, RPVI [facility] Radiology Electronically Signed   By: Corrie Mckusick D.O.   On: 04/20/2021 15:17   IR NEPHROSTOMY PLACEMENT LEFT  Result Date: 04/20/2021 INDICATION: 75 year old male with spontaneous retroperitoneal hemorrhage, acute renal failure, with left-sided ureteral obstruction EXAM: ULTRASOUND-GUIDED LEFT PERCUTANEOUS NEPHROSTOMY IMAGE GUIDED TEMPORARY HEMODIALYSIS CATHETER COMPARISON:  CT 04/19/2021 MEDICATIONS: 2 g Rocephin; The antibiotic was administered in an appropriate time frame prior to skin puncture. ANESTHESIA/SEDATION: Fentanyl 50 mcg IV; Versed 0 mg IV Moderate Sedation Time:  0 The patient was  continuously monitored during the procedure by the interventional radiology nurse under my direct supervision. CONTRAST:  59mL OMNIPAQUE IOHEXOL 300 MG/ML SOLN - administered into the collecting system(s) FLUOROSCOPY TIME:  Fluoroscopy Time: 2 minutes 12 seconds (14 mGy). COMPLICATIONS: None PROCEDURE: Informed written consent was obtained from the patient's family after a thorough discussion of the procedural risks, benefits and alternatives, regarding image guided percutaneous nephrostomy. All questions were addressed. Informed written consent was also obtained from the patient's family after a discussion of the risks, benefits, and alternatives to treatment, regarding image guided hemodialysis catheter placement. Questions regarding the procedure were encouraged and answered. Patient positioned prone position on the fluoroscopy table. Ultrasound survey of the left flank was performed with images stored and sent to PACs. The patient was then prepped and draped in the usual sterile fashion. 1% lidocaine was used to anesthetize the skin and subcutaneous tissues for local anesthesia. A Chiba needle was then used to access a posterior inferior calyx with ultrasound guidance. With spontaneous urine returned through the needle, passage of an 018 micro wire into the collecting system was performed under fluoroscopy. A small incision was made with an 11  blade scalpel, and the needle was removed from the wire. An Accustick system was then advanced over the wire into the collecting system under fluoroscopy. The metal stiffener and inner dilator were removed, and then a sample of fluid was aspirated through the 4 French outer sheath. Bentson wire was passed into the collecting system and the sheath removed. Ten French dilation of the soft tissues was performed. Using modified Seldinger technique, a 10 French pigtail catheter drain was placed over the Bentson wire. Wire and inner stiffener removed, and the pigtail was formed  in the collecting system. Small amount of contrast confirmed position of the catheter. Patient tolerated the procedure well and remained hemodynamically stable throughout. No complications were encountered and no significant blood loss encountered The patient was then moved into a supine position to proceed with temporary hemodialysis catheter. The right neck was prepped with chlorhexidine in a sterile fashion, and a sterile drape was applied covering the operative field. Maximum barrier sterile technique with sterile gowns and gloves were used for the procedure. A timeout was performed prior to the initiation of the procedure A micropuncture kit was utilized to access the right internal jugular vein under direct, real-time ultrasound guidance after the overlying soft tissues were anesthetized with 1% lidocaine with epinephrine. Ultrasound image documentation was performed. The microwire was kinked to measure appropriate catheter length. A guidewire was advanced to the level of the IVC. A 15 cm hemodialysis catheter was then placed over the wire. Final catheter positioning was confirmed and documented with a spot radiographic image. The catheter aspirates and flushes normally. The catheter was flushed with appropriate volume heparin dwells. Dressings were applied. The patient tolerated the procedure well without immediate post procedural complication. IMPRESSION: Status post image guided left percutaneous nephrostomy, as well as same session image guided temporary hemodialysis catheter at the right IJ. Catheter may be converted if needed Signed, Dulcy Fanny. Dellia Nims, RPVI [facility] Radiology Electronically Signed   By: Corrie Mckusick D.O.   On: 04/20/2021 15:17        Scheduled Meds:  (feeding supplement) PROSource Plus  30 mL Oral BID BM   amiodarone  200 mg Oral Daily   atorvastatin  40 mg Oral q1800   B-complex with vitamin C  1 tablet Oral Daily    Chlorhexidine Gluconate Cloth  6 each Topical Q0600   docusate sodium  100 mg Oral BID   feeding supplement (NEPRO CARB STEADY)  237 mL Oral BID BM   insulin aspart  0-15 Units Subcutaneous TID WC   midodrine  10 mg Oral TID WC   pantoprazole  40 mg Oral Daily   sodium chloride flush  3 mL Intravenous Q12H   Continuous Infusions:  sodium chloride 65 mL/hr at 04/22/21 0200   cefTRIAXone (ROCEPHIN)  IV Stopped (04/21/21 1228)     LOS: 14 days   Time spent= 35 mins    Anndee Connett Arsenio Loader, MD [facility]  If 7PM-7AM, please contact night-coverage  04/22/2021, 9:12 AM

## 2021-04-22 NOTE — Progress Notes (Signed)
Mobility Specialist: Progress Note   04/22/21 1152  Mobility  Activity  (Bed Level Exercises)  Level of Assistance Minimal assist, patient does 75% or more  Assistive Device None  Mobility Sit up in bed/chair position for meals  Mobility Response Tolerated fair  Mobility performed by Mobility specialist  $Mobility charge 1 Mobility   Pre-Mobility: 78 HR, 143/70 BP, 95% SpO2 During Mobility            On RA: 86-88% SpO2           On 1 L/min: 96% SpO2 Post-Mobility on RA: 79 HR, 145/68 BP, 94% SpO2  Pt limited d/t pain in BLE, more so LLE though. Pt c/o 5-6/10 pain and was agreeable to bed level exercises but did not feel he could sit EOB. Pt was able to turn to each side with minA to have pad changed out as well. Pt has call bell and phone at his side.   [facility] Tayvon Culley Mobility Specialist Mobility Specialist Phone: [phone]

## 2021-04-22 NOTE — Progress Notes (Signed)
Referring Physician(s): Dr. Joylene Grapes, S.   Supervising Physician: Ruthann Cancer  Patient Status:  [facility] - In-pt  Chief Complaint:  Left ureteral obstruction and AKI S/p temp dialysis catheter and L PCN placement with Dr. Earleen Newport on 8/1  Subjective:  Patient laying in bed, not in acute distress.  States that he feels drowsy this morning.  No complaints regarding the temp cath or PCN.   Allergies: [name] allergy]  Medications: Prior to Admission medications   Medication Sig Start Date End Date Taking? Authorizing Provider  acetaminophen (TYLENOL) 325 MG tablet Take 650 mg by mouth every 6 (six) hours as needed for mild pain or headache.   Yes [provider]  amiodarone (PACERONE) 200 MG tablet TAKE 1 TABLET BY MOUTH EVERY DAY Patient taking differently: Take 200 mg by mouth daily. 03/03/21  Yes Bhagat, Bhavinkumar, PA  atorvastatin (LIPITOR) 40 MG tablet Take 1 tablet (40 mg total) by mouth daily at 6 PM. 10/06/12  Yes Timmothy Euler, MD  bisacodyl (DULCOLAX) 5 MG EC tablet Take 5 mg by mouth daily as needed for moderate constipation.   Yes [provider]  docusate sodium (COLACE) 100 MG capsule Take 100 mg by mouth daily.   Yes [provider]  empagliflozin (JARDIANCE) 10 MG TABS tablet Take 1 tablet (10 mg total) by mouth daily. 11/11/20  Yes Larey Dresser, MD  feeding supplement (ENSURE ENLIVE / ENSURE PLUS) LIQD Take 237 mLs by mouth 2 (two) times daily between meals. 03/12/21  Yes Rhyne, Hulen Shouts, PA-C  furosemide (LASIX) 20 MG tablet Take 2 tablets (40 mg total) by mouth daily for 3 days, THEN 2 tablets (40 mg total) as needed for edema. 04/03/21 04/06/22 Yes Milford, Maricela Bo, FNP  gabapentin (NEURONTIN) 300 MG capsule Take 300 mg by mouth at bedtime.   Yes [provider]  guaiFENesin (ROBITUSSIN) 100 MG/5ML SOLN Take 5 mLs by mouth every 4 (four) hours as needed for cough or to loosen phlegm.   Yes [provider]   HYDROcodone-acetaminophen (NORCO/VICODIN) 5-325 MG tablet Take 1 tablet by mouth 2 (two) times daily as needed for moderate pain. 03/12/21  Yes Rhyne, Hulen Shouts, PA-C  Multiple Vitamins-Minerals (CENTRUM SILVER PO) Take 1 tablet by mouth 3 (three) times a week.   Yes [provider]  mupirocin ointment (BACTROBAN) 2 % Apply 1 application topically 2 (two) times daily.   Yes [provider]  pantoprazole (PROTONIX) 40 MG tablet Take 40 mg by mouth daily.  11/12/14  Yes [provider]  triamcinolone cream (KENALOG) 0.5 % Apply 1 application topically daily as needed (skin rash).  02/15/14  Yes [provider]  warfarin (COUMADIN) 7.5 MG tablet Take 7.5 mg by mouth daily.   Yes [provider]  potassium chloride SA (KLOR-CON) 10 MEQ tablet Take 1 tablet (10 mEq total) by mouth daily for 3 days. 04/03/21 04/06/21  Rafael Bihari, FNP     Vital Signs: BP 131/66 (BP Location: Right Arm)   Pulse 78   Temp 98.4 F (36.9 C) (Oral)   Resp 19   Ht 6' (1.829 m)   Wt 191 lb 5.8 oz (86.8 kg)   SpO2 90%   BMI 25.95 kg/m   Physical Exam Vitals reviewed.  Constitutional:      General: He is not in acute distress.    Appearance: Normal appearance. He is not ill-appearing.     Comments: Slightly somnolent  HENT:  Head: Normocephalic and atraumatic.  Neck:     Comments: Right IJ temp cath  Pulmonary:     Effort: Pulmonary effort is normal.  Abdominal:     General: Abdomen is flat.     Palpations: Abdomen is soft.  Skin:    General: Skin is warm and dry.     Comments: Positive left PCN to a gravity bag. Site is unremarkable with no erythema, edema, tenderness, bleeding or drainage. Suture and stat lock in place. Dressing is clean, dry, and intact. 200 ml of  urine colored fluid noted in the bag.   Neurological:     Mental Status: He is oriented to person, place, and time.  Psychiatric:        Mood and Affect: Mood normal.        Behavior:  Behavior normal.    Imaging: CT ABDOMEN PELVIS WO CONTRAST  Result Date: 04/18/2021 CLINICAL DATA:  Patient with anemia.  On anticoagulation. EXAM: CT ABDOMEN AND PELVIS WITHOUT CONTRAST TECHNIQUE: Multidetector CT imaging of the abdomen and pelvis was performed following the standard protocol without IV contrast. COMPARISON:  Renal ultrasound 03/09/2021 FINDINGS: Lower chest: Patchy ground-glass and consolidative opacities within the lower lobes bilaterally most compatible with atelectasis. Trace bilateral pleural effusions. Hepatobiliary: Liver is normal in size and contour. Stones and sludge within the gallbladder lumen. No intrahepatic or extrahepatic biliary ductal dilatation. Pancreas: Unremarkable Spleen: Unremarkable Adrenals/Urinary Tract: Normal adrenal glands. The left kidney is displaced cranially. There is moderate left hydronephrosis. Urinary bladder is unremarkable. Stomach/Bowel: No abnormal bowel wall thickening or evidence for bowel obstruction. No free intraperitoneal air. Vascular/Lymphatic: Normal caliber abdominal aorta. No retroperitoneal lymphadenopathy. Reproductive: Unremarkable. Other: There is a large 17 x 17 x 7 cm left retroperitoneal fluid collection with layering high density favored to represent blood products. Musculoskeletal: Mild expansion of the left iliacus muscle. Right hip joint degenerative changes. Lower thoracic and lumbar spine degenerative changes. There is mild height loss of the superior endplate of the L5 and L3 vertebral bodies. IMPRESSION: There is a large (up to 17 cm) left retroperitoneal hematoma. Additionally there is expansion of the left iliacus muscle most compatible with intramuscular hemorrhage. The large hematoma displaces the left kidney cranially. There is new moderate left hydronephrosis, likely secondary to mass effect on the left ureter. Age-indeterminate superior endplate compression deformities of the L5 and L3 vertebral bodies. Critical  Value/emergent results were discussed at the time of interpretation on 04/18/2021 at 1:47 pm to provider [facility] , who verbally acknowledged these results. Electronically Signed   By: Lovey Newcomer M.D.   On: 04/18/2021 13:52   IR Fluoro Guide CV Line Right  Result Date: 04/20/2021 INDICATION: 75 year old male with spontaneous retroperitoneal hemorrhage, acute renal failure, with left-sided ureteral obstruction EXAM: ULTRASOUND-GUIDED LEFT PERCUTANEOUS NEPHROSTOMY IMAGE GUIDED TEMPORARY HEMODIALYSIS CATHETER COMPARISON:  CT 04/19/2021 MEDICATIONS: 2 g Rocephin; The antibiotic was administered in an appropriate time frame prior to skin puncture. ANESTHESIA/SEDATION: Fentanyl 50 mcg IV; Versed 0 mg IV Moderate Sedation Time:  0 The patient was continuously monitored during the procedure by the interventional radiology nurse under my direct supervision. CONTRAST:  39mL OMNIPAQUE IOHEXOL 300 MG/ML SOLN - administered into the collecting system(s) FLUOROSCOPY TIME:  Fluoroscopy Time: 2 minutes 12 seconds (14 mGy). COMPLICATIONS: None PROCEDURE: Informed written consent was obtained from the patient's family after a thorough discussion of the procedural risks, benefits and alternatives, regarding image guided percutaneous nephrostomy. All questions were addressed. Informed written consent was also obtained  from the patient's family after a discussion of the risks, benefits, and alternatives to treatment, regarding image guided hemodialysis catheter placement. Questions regarding the procedure were encouraged and answered. Patient positioned prone position on the fluoroscopy table. Ultrasound survey of the left flank was performed with images stored and sent to PACs. The patient was then prepped and draped in the usual sterile fashion. 1% lidocaine was used to anesthetize the skin and subcutaneous tissues for local anesthesia. A Chiba needle was then used to access a posterior inferior calyx with ultrasound  guidance. With spontaneous urine returned through the needle, passage of an 018 micro wire into the collecting system was performed under fluoroscopy. A small incision was made with an 11 blade scalpel, and the needle was removed from the wire. An Accustick system was then advanced over the wire into the collecting system under fluoroscopy. The metal stiffener and inner dilator were removed, and then a sample of fluid was aspirated through the 4 French outer sheath. Bentson wire was passed into the collecting system and the sheath removed. Ten French dilation of the soft tissues was performed. Using modified Seldinger technique, a 10 French pigtail catheter drain was placed over the Bentson wire. Wire and inner stiffener removed, and the pigtail was formed in the collecting system. Small amount of contrast confirmed position of the catheter. Patient tolerated the procedure well and remained hemodynamically stable throughout. No complications were encountered and no significant blood loss encountered The patient was then moved into a supine position to proceed with temporary hemodialysis catheter. The right neck was prepped with chlorhexidine in a sterile fashion, and a sterile drape was applied covering the operative field. Maximum barrier sterile technique with sterile gowns and gloves were used for the procedure. A timeout was performed prior to the initiation of the procedure A micropuncture kit was utilized to access the right internal jugular vein under direct, real-time ultrasound guidance after the overlying soft tissues were anesthetized with 1% lidocaine with epinephrine. Ultrasound image documentation was performed. The microwire was kinked to measure appropriate catheter length. A guidewire was advanced to the level of the IVC. A 15 cm hemodialysis catheter was then placed over the wire. Final catheter positioning was confirmed and documented with a spot radiographic image. The catheter aspirates and  flushes normally. The catheter was flushed with appropriate volume heparin dwells. Dressings were applied. The patient tolerated the procedure well without immediate post procedural complication. IMPRESSION: Status post image guided left percutaneous nephrostomy, as well as same session image guided temporary hemodialysis catheter at the right IJ. Catheter may be converted if needed Signed, Dulcy Fanny. Dellia Nims, RPVI [facility] Radiology Electronically Signed   By: Corrie Mckusick D.O.   On: 04/20/2021 15:17   IR US Guide Vasc Access Right  Result Date: 04/20/2021 INDICATION: 75 year old male with spontaneous retroperitoneal hemorrhage, acute renal failure, with left-sided ureteral obstruction EXAM: ULTRASOUND-GUIDED LEFT PERCUTANEOUS NEPHROSTOMY IMAGE GUIDED TEMPORARY HEMODIALYSIS CATHETER COMPARISON:  CT 04/19/2021 MEDICATIONS: 2 g Rocephin; The antibiotic was administered in an appropriate time frame prior to skin puncture. ANESTHESIA/SEDATION: Fentanyl 50 mcg IV; Versed 0 mg IV Moderate Sedation Time:  0 The patient was continuously monitored during the procedure by the interventional radiology nurse under my direct supervision. CONTRAST:  65mL OMNIPAQUE IOHEXOL 300 MG/ML SOLN - administered into the collecting system(s) FLUOROSCOPY TIME:  Fluoroscopy Time: 2 minutes 12 seconds (14 mGy). COMPLICATIONS: None PROCEDURE: Informed written consent was obtained from the patient's family after a thorough  discussion of the procedural risks, benefits and alternatives, regarding image guided percutaneous nephrostomy. All questions were addressed. Informed written consent was also obtained from the patient's family after a discussion of the risks, benefits, and alternatives to treatment, regarding image guided hemodialysis catheter placement. Questions regarding the procedure were encouraged and answered. Patient positioned prone position on the fluoroscopy table.  Ultrasound survey of the left flank was performed with images stored and sent to PACs. The patient was then prepped and draped in the usual sterile fashion. 1% lidocaine was used to anesthetize the skin and subcutaneous tissues for local anesthesia. A Chiba needle was then used to access a posterior inferior calyx with ultrasound guidance. With spontaneous urine returned through the needle, passage of an 018 micro wire into the collecting system was performed under fluoroscopy. A small incision was made with an 11 blade scalpel, and the needle was removed from the wire. An Accustick system was then advanced over the wire into the collecting system under fluoroscopy. The metal stiffener and inner dilator were removed, and then a sample of fluid was aspirated through the 4 French outer sheath. Bentson wire was passed into the collecting system and the sheath removed. Ten French dilation of the soft tissues was performed. Using modified Seldinger technique, a 10 French pigtail catheter drain was placed over the Bentson wire. Wire and inner stiffener removed, and the pigtail was formed in the collecting system. Small amount of contrast confirmed position of the catheter. Patient tolerated the procedure well and remained hemodynamically stable throughout. No complications were encountered and no significant blood loss encountered The patient was then moved into a supine position to proceed with temporary hemodialysis catheter. The right neck was prepped with chlorhexidine in a sterile fashion, and a sterile drape was applied covering the operative field. Maximum barrier sterile technique with sterile gowns and gloves were used for the procedure. A timeout was performed prior to the initiation of the procedure A micropuncture kit was utilized to access the right internal jugular vein under direct, real-time ultrasound guidance after the overlying soft tissues were anesthetized with 1% lidocaine with epinephrine.  Ultrasound image documentation was performed. The microwire was kinked to measure appropriate catheter length. A guidewire was advanced to the level of the IVC. A 15 cm hemodialysis catheter was then placed over the wire. Final catheter positioning was confirmed and documented with a spot radiographic image. The catheter aspirates and flushes normally. The catheter was flushed with appropriate volume heparin dwells. Dressings were applied. The patient tolerated the procedure well without immediate post procedural complication. IMPRESSION: Status post image guided left percutaneous nephrostomy, as well as same session image guided temporary hemodialysis catheter at the right IJ. Catheter may be converted if needed Signed, Dulcy Fanny. Dellia Nims, RPVI [facility] Radiology Electronically Signed   By: Corrie Mckusick D.O.   On: 04/20/2021 15:17   DG CHEST PORT 1 VIEW  Result Date: 04/19/2021 CLINICAL DATA:  75 year old male with shortness of breath. EXAM: PORTABLE CHEST 1 VIEW COMPARISON:  Portable chest 09/01/2020 and earlier. FINDINGS: Portable AP semi upright view at 0523 hours. Chronic cardiomegaly appears stable. Visualized tracheal air column is within normal limits. Chronic left chest AICD, cardiac valve replacement and sternotomy. Substantially lower lung volumes. No pneumothorax. Upper lung pulmonary vascularity appears normal. Patchy bibasilar opacity. Small pleural effusion(s) difficult to exclude. No air bronchograms. Negative visible bowel gas pattern. Stable visualized osseous structures. IMPRESSION: 1. Substantially lower lung volumes with bibasilar opacity, nonspecific  but perhaps just atelectasis. Difficult to exclude small pleural effusions. 2. Cardiomegaly.  No acute pulmonary edema. Electronically Signed   By: Genevie Ann M.D.   On: 04/19/2021 08:06   CT RENAL STONE STUDY  Result Date: 04/19/2021 CLINICAL DATA:  Acute renal failure.  Decreased urine  output. EXAM: CT ABDOMEN AND PELVIS WITHOUT CONTRAST TECHNIQUE: Multidetector CT imaging of the abdomen and pelvis was performed following the standard protocol without IV contrast. COMPARISON:  04/18/2021 FINDINGS: Lower chest: Subpleural atelectasis and consolidation noted within both lung bases. Cardiac enlargement. ICD leads are noted within the right ventricle and left atrial appendage. Hepatobiliary: No focal liver abnormality identified. Small stones are noted layering within the gallbladder. No gallbladder wall thickening or pericholecystic inflammatory fat stranding. No bile duct dilatation. Pancreas: No acute findings. Spleen: Normal in size without focal abnormality. Adrenals/Urinary Tract: Normal adrenal glands. Right kidney is unremarkable. Left-sided hydronephrosis and hydroureter identified. I suspect this is secondary to mass effect from left retroperitoneal hematoma. Urinary bladder is grossly unremarkable. Stomach/Bowel: Stomach appears normal. There are a few mildly increased loops of small bowel which measure up to 2.7 cm. No specific findings to suggest bowel obstruction with gas and stool noted throughout the colon up to the level of the rectum. Vascular/Lymphatic: Aortic atherosclerosis. No aneurysm. No abdominopelvic adenopathy. Reproductive: Prostate is unremarkable. Other: Large left retroperitoneal hematoma is again identified. Using the same measuring technique as the previous exam this measures 16.5 x 7.8 by 16.8 cm, image 53/6 and image 57/3. Layering high attenuation material is identified consistent with subacute to acute blood products. On the previous exam this measured 18.2 x 8.5 by 17.3 cm. Intramuscular hematoma involving the left iliopsoas muscle is stable from previous exam. Small amount of mixed attenuation fluid infiltration within the presacral soft tissue space appears similar. Musculoskeletal: There is multilevel superior endplate compression deformities within the lumbar  spine which appear unchanged from the previous exam and remain indeterminate. Multilevel degenerative disc disease also noted. IMPRESSION: 1. Similar appearance of large left retroperitoneal hematoma. 2. Left-sided hydronephrosis and hydroureter identified. I suspect this is secondary to mass effect from left retroperitoneal hematoma. 3. Similar appearance of intramuscular hematoma involving the left iliopsoas muscle. 4. Aortic atherosclerosis. 5. Gallstones. Aortic Atherosclerosis (ICD10-I70.0). Electronically Signed   By: Kerby Moors M.D.   On: 04/19/2021 07:00   IR NEPHROSTOMY PLACEMENT LEFT  Result Date: 04/20/2021 INDICATION: 76 year old male with spontaneous retroperitoneal hemorrhage, acute renal failure, with left-sided ureteral obstruction EXAM: ULTRASOUND-GUIDED LEFT PERCUTANEOUS NEPHROSTOMY IMAGE GUIDED TEMPORARY HEMODIALYSIS CATHETER COMPARISON:  CT 04/19/2021 MEDICATIONS: 2 g Rocephin; The antibiotic was administered in an appropriate time frame prior to skin puncture. ANESTHESIA/SEDATION: Fentanyl 50 mcg IV; Versed 0 mg IV Moderate Sedation Time:  0 The patient was continuously monitored during the procedure by the interventional radiology nurse under my direct supervision. CONTRAST:  61mL OMNIPAQUE IOHEXOL 300 MG/ML SOLN - administered into the collecting system(s) FLUOROSCOPY TIME:  Fluoroscopy Time: 2 minutes 12 seconds (14 mGy). COMPLICATIONS: None PROCEDURE: Informed written consent was obtained from the patient's family after a thorough discussion of the procedural risks, benefits and alternatives, regarding image guided percutaneous nephrostomy. All questions were addressed. Informed written consent was also obtained from the patient's family after a discussion of the risks, benefits, and alternatives to treatment, regarding image guided hemodialysis catheter placement. Questions regarding the procedure were encouraged and answered. Patient positioned prone position on the fluoroscopy  table. Ultrasound survey of the left flank was performed with images stored and sent  to PACs. The patient was then prepped and draped in the usual sterile fashion. 1% lidocaine was used to anesthetize the skin and subcutaneous tissues for local anesthesia. A Chiba needle was then used to access a posterior inferior calyx with ultrasound guidance. With spontaneous urine returned through the needle, passage of an 018 micro wire into the collecting system was performed under fluoroscopy. A small incision was made with an 11 blade scalpel, and the needle was removed from the wire. An Accustick system was then advanced over the wire into the collecting system under fluoroscopy. The metal stiffener and inner dilator were removed, and then a sample of fluid was aspirated through the 4 French outer sheath. Bentson wire was passed into the collecting system and the sheath removed. Ten French dilation of the soft tissues was performed. Using modified Seldinger technique, a 10 French pigtail catheter drain was placed over the Bentson wire. Wire and inner stiffener removed, and the pigtail was formed in the collecting system. Small amount of contrast confirmed position of the catheter. Patient tolerated the procedure well and remained hemodynamically stable throughout. No complications were encountered and no significant blood loss encountered The patient was then moved into a supine position to proceed with temporary hemodialysis catheter. The right neck was prepped with chlorhexidine in a sterile fashion, and a sterile drape was applied covering the operative field. Maximum barrier sterile technique with sterile gowns and gloves were used for the procedure. A timeout was performed prior to the initiation of the procedure A micropuncture kit was utilized to access the right internal jugular vein under direct, real-time ultrasound guidance after the overlying soft tissues were anesthetized with 1% lidocaine with epinephrine.  Ultrasound image documentation was performed. The microwire was kinked to measure appropriate catheter length. A guidewire was advanced to the level of the IVC. A 15 cm hemodialysis catheter was then placed over the wire. Final catheter positioning was confirmed and documented with a spot radiographic image. The catheter aspirates and flushes normally. The catheter was flushed with appropriate volume heparin dwells. Dressings were applied. The patient tolerated the procedure well without immediate post procedural complication. IMPRESSION: Status post image guided left percutaneous nephrostomy, as well as same session image guided temporary hemodialysis catheter at the right IJ. Catheter may be converted if needed Signed, Dulcy Fanny. Dellia Nims, RPVI [facility] Radiology Electronically Signed   By: Corrie Mckusick D.O.   On: 04/20/2021 15:17   VAS [place] LOWER EXTREMITY VENOUS (DVT)  Result Date: 04/22/2021  Lower Venous DVT Study Patient Name:  Ryan [name]  Date of Exam:   04/22/2021 Medical Rec #: [id number]         Accession #:    [id number] Date of Birth: [birth year]         Patient Gender: M Patient Age:   75Y Exam Location:  [facility] Procedure:      VAS [place] LOWER EXTREMITY VENOUS (DVT) Referring Phys: [id number] VASUNDHRA RATHORE --------------------------------------------------------------------------------  Indications: Left thigh pain & swelling. Other Indications: Recent LLE angiogram (04/13/21). Comparison Study: No previous exams Performing Technologist: Jody Hill RVT, RDMS  Examination Guidelines: A complete evaluation includes B-mode imaging, spectral Doppler, color Doppler, and power Doppler as needed of all accessible portions of each vessel. Bilateral testing is considered an integral part of a complete examination. Limited examinations for reoccurring indications may be performed as noted. The reflux portion of the exam is performed with the  patient in reverse Trendelenburg.  +-----+---------------+---------+-----------+----------+--------------+  RIGHTCompressibilityPhasicitySpontaneityPropertiesThrombus Aging +-----+---------------+---------+-----------+----------+--------------+ CFV  Full           Yes      Yes                                 +-----+---------------+---------+-----------+----------+--------------+   +---------+---------------+---------+-----------+----------+--------------+ LEFT     CompressibilityPhasicitySpontaneityPropertiesThrombus Aging +---------+---------------+---------+-----------+----------+--------------+ CFV      Full           Yes      Yes                                 +---------+---------------+---------+-----------+----------+--------------+ SFJ      Full                                                        +---------+---------------+---------+-----------+----------+--------------+ FV Prox  Full           Yes      Yes                                 +---------+---------------+---------+-----------+----------+--------------+ FV Mid   Full           Yes      Yes                                 +---------+---------------+---------+-----------+----------+--------------+ FV DistalFull           Yes      Yes                                 +---------+---------------+---------+-----------+----------+--------------+ PFV      Full                                                        +---------+---------------+---------+-----------+----------+--------------+ POP      Full           Yes      Yes                                 +---------+---------------+---------+-----------+----------+--------------+ PTV      Full                                                        +---------+---------------+---------+-----------+----------+--------------+ PERO     Full                                                         +---------+---------------+---------+-----------+----------+--------------+     Summary: RIGHT: -  No evidence of common femoral vein obstruction.  LEFT: - There is no evidence of deep vein thrombosis in the lower extremity. - There is no evidence of superficial venous thrombosis.  - No cystic structure found in the popliteal fossa. Subcutaneous edema in area of pain (LLE mid & distal thigh).  *See table(s) above for measurements and observations.    Preliminary     Labs:  CBC: Recent Labs    04/20/21 0158 04/20/21 2052 04/21/21 0440 04/21/21 1307 04/22/21 0435  WBC 13.9*  --  11.4* 10.7* 9.5  HGB 5.8* 7.1* 6.9* 9.5* 9.4*  HCT 18.6* 22.9* 21.5* 29.3* 29.7*  PLT 251  --  230 202 207    COAGS: Recent Labs    03/09/21 0241 03/09/21 1556 03/10/21 0111 03/11/21 0107 03/12/21 0123 04/08/21 0826 04/08/21 2044 04/19/21 1548 04/20/21 0158 04/21/21 0440 04/22/21 0435  INR  --    < > 1.3* 1.3*   < > 3.2*   < > 1.5* 1.3* 1.6* 1.4*  APTT 68*  --  93* 80*  --  47*  --   --   --   --   --    < > = values in this interval not displayed.    BMP: Recent Labs    08/25/20 1121 08/25/20 1419 09/22/20 1446 10/01/20 1157 10/14/20 1403 04/19/21 0559 04/20/21 0158 04/21/21 0440 04/22/21 0435  NA 144   < > 138 140   < > 132* 134* 135 137  K 4.8   < > 5.1 4.6   < > 5.6* 5.5* 4.5 3.5  CL 113*   < > 103 106   < > 100 101 99 103  CO2 18*   < > 18* 22   < > 20* $Rem'22 22 24  'SkpI$ GLUCOSE 132*   < > 106* 98   < > 122* 119* 108* 96  BUN 30*   < > 42* 37*   < > 59* 74* 89* 58*  CALCIUM 8.8   < > 9.6 9.3   < > 8.2* 8.5* 8.4* 8.2*  CREATININE 1.76*   < > 2.20* 1.88*   < > 5.12* 5.63* 5.07* 2.83*  GFRNONAA 37*   < > 28* 34*   < > 11* 10* 11* 22*  GFRAA 42*  --  32* 39*  --   --   --   --   --    < > = values in this interval not displayed.    LIVER FUNCTION TESTS: Recent Labs    01/22/21 1208 03/03/21 1600 03/10/21 0111 03/11/21 0107 03/12/21 0123 04/08/21 0826  BILITOT 0.5 1.0  --   --   --   0.6  AST 35 55*  --   --   --  22  ALT 43 46*  --   --   --  18  ALKPHOS 65 90  --   --   --  123  PROT 6.2* 6.3*  --   --   --  6.6  ALBUMIN 2.9* 3.4* 2.2* 2.2* 2.1* 3.2*    Assessment and Plan:  75 year old male with spontaneous retroperitoneal hemorrhage, acute renal failure, with left-sided ureteral obstruction. S/p temp HD catheter and left PCN placement with Dr. Earleen Newport on 8/1.   Pt stable, L PCN intact. Puncture site unremarkable, no s/s of bleeding or infection.  OP 340 cc, clear urine VSS WBC 9.5 today (10.7 yesterday) RF improving BUN 58 (89 yesterday) Creatinine 2.83 (5.07) GFR 22 (11)  Continue with flushing TID, output recording q shift and dressing changes as needed. Would consider additional imaging when output is less than 10 ml for 24 hours not including flush material.    Further treatment plan per [facility]/ Vascular Surgery/ Urology  Appreciate and agree with the plan.  IR to follow.    Electronically Signed: Tera Mater, PA-C 04/22/2021, 11:13 AM   I spent a total of 15 Minutes at the the patient's bedside AND on the patient's hospital floor or unit, greater than 50% of which was counseling/coordinating care for L PCN

## 2021-04-22 NOTE — Progress Notes (Signed)
Overnight progress note  RN concerned about patient having pain and swelling in his left thigh, lower extremity pulses intact.  He is not on anticoagulation due to retroperitoneal hematoma.  Does not have SCDs.   -Doppler ordered to rule out DVT

## 2021-04-22 NOTE — Progress Notes (Signed)
Nephrology Follow-Up Consult note   Assessment/Recommendations: Ryan [name] is a/an 75 y.o. 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  AKI now with likely ATN and some degree of obstruction related to left-sided hydronephrosis status post nephrostomy tube on 8/1.  Uremia has improved after HD 8/3 and UOP increasing -Appreciate help from IR with temporary dialysis catheter and nephrostomy tube -Hold dialysis today and monitor progress -Follow-up family meeting details -Continue to monitor daily Cr, Dose meds for GFR -Monitor Daily I/Os, Daily weight  -Maintain MAP>65 for optimal renal perfusion.  -Avoid nephrotoxic medications including NSAIDs and Vanc/Zosyn combo  Left-sided hydronephrosis: This is secondary to retroperitoneal bleed causing obstruction.   -Appreciate help from IR and urology -Urology recommends conversion to antegrade stent in the future w/ IR -Output is improving, ctm  Right critical lower limb ischemia: Continue dressing changes and management per primary team  LLE pain: and swelling. F/u PVL. If DVT is present this will make his situation very complicated  Retroperitoneal bleed: Anemia improved today with transfusions.  Continue transfusions per primary team  Systolic heart failure: On midodrine.  Holding Lasix.  Monitor volume status closely  DM2: Continue sliding scale per primary team   Recommendations conveyed to primary service.    [facility] 04/22/2021 10:03 AM  ___________________________________________________________  CC: AKI on CKD  Interval History/Subjective: Worsening left lower extremity pain overnight with swelling.  PVL ordered but not resulted.  Hemoglobin improved to 9.4.   Tolerated dialysis yesterday with less confusion today.  Urine output has increased to 600.   Medications:  Current Facility-Administered Medications  Medication Dose Route Frequency Provider Last Rate Last Admin   (feeding supplement) PROSource Plus liquid 30 mL  30 mL Oral BID BM Charlynne Cousins, MD   30 mL at 04/22/21 0915   0.9 %  sodium chloride infusion   Intravenous Continuous Roney Jaffe, MD 65 mL/hr at 04/22/21 0923 New Bag at 04/22/21 0923   acetaminophen (TYLENOL) tablet 650 mg  650 mg Oral Q8H PRN Shela Leff, MD   650 mg at 04/21/21 2135   alum & mag hydroxide-simeth (MAALOX/MYLANTA) 200-200-20 MG/5ML suspension 30 mL  30 mL Oral Q4H PRN Charlynne Cousins, MD   30 mL at 04/17/21 0144   amiodarone (PACERONE) tablet 200 mg  200 mg Oral Daily Waynetta Sandy, MD   200 mg at 04/22/21 0915   atorvastatin (LIPITOR) tablet 40 mg  40 mg Oral q1800 Waynetta Sandy, MD   40 mg at 04/21/21 1722   B-complex with vitamin C tablet 1 tablet  1 tablet Oral Daily Charlynne Cousins, MD   1 tablet at 04/22/21 0915   bisacodyl (DULCOLAX) EC tablet 5 mg  5 mg Oral Daily PRN Waynetta Sandy, MD   5 mg at 04/17/21 0047   cefTRIAXone (ROCEPHIN) 1 g in sodium chloride 0.9 % 100 mL IVPB  1 g Intravenous Q24H Reesa Chew, MD 200 mL/hr at 04/22/21 0925 1 g at 04/22/21 0925   Chlorhexidine Gluconate Cloth 2 % PADS 6 each  6 each Topical Q0600 Charlynne Cousins, MD   6 each at 04/21/21 [phone]   dextromethorphan-guaiFENesin (MUCINEX DM) 30-600 MG per 12 hr tablet 1 tablet  1 tablet Oral BID PRN Amin,  Ankit Chirag, MD       docusate sodium (COLACE) capsule 100 mg  100 mg Oral BID Waynetta Sandy, MD   100 mg at 04/22/21 0915   feeding supplement (NEPRO CARB STEADY) liquid 237 mL  237 mL Oral BID BM Charlynne Cousins, MD   237 mL at 04/22/21 0916   hydrALAZINE (APRESOLINE) injection 10 mg  10 mg Intravenous Q4H PRN Damita Lack, MD        HYDROcodone-acetaminophen (NORCO) 7.5-325 MG per tablet 1 tablet  1 tablet Oral Q6H PRN Charlynne Cousins, MD   1 tablet at 04/22/21 0527   insulin aspart (novoLOG) injection 0-15 Units  0-15 Units Subcutaneous TID WC Waynetta Sandy, MD   2 Units at 04/19/21 1125   metoprolol tartrate (LOPRESSOR) injection 5 mg  5 mg Intravenous Q4H PRN Amin, Ankit Chirag, MD       midodrine (PROAMATINE) tablet 10 mg  10 mg Oral TID WC Charlynne Cousins, MD   10 mg at 04/22/21 0915   ondansetron (ZOFRAN) injection 4 mg  4 mg Intravenous Q6H PRN Waynetta Sandy, MD   4 mg at 04/17/21 1611   ondansetron (ZOFRAN) tablet 4 mg  4 mg Oral Q6H PRN Waynetta Sandy, MD   4 mg at 04/16/21 1212   pantoprazole (PROTONIX) EC tablet 40 mg  40 mg Oral Daily Waynetta Sandy, MD   40 mg at 04/22/21 0915   polyethylene glycol (MIRALAX / GLYCOLAX) packet 17 g  17 g Oral Daily PRN Vernelle Emerald, MD   17 g at 04/16/21 2147   senna-docusate (Senokot-S) tablet 1 tablet  1 tablet Oral QHS PRN Amin, Ankit Chirag, MD       sodium chloride flush (NS) 0.9 % injection 3 mL  3 mL Intravenous Q12H Waynetta Sandy, MD   3 mL at 04/22/21 0916   sodium chloride flush (NS) 0.9 % injection 3 mL  3 mL Intravenous PRN Waynetta Sandy, MD       traZODone (DESYREL) tablet 50 mg  50 mg Oral QHS PRN Amin, Jeanella Flattery, MD          Review of Systems: 10 systems reviewed and negative except per interval history/subjective  Physical Exam: Vitals:   04/22/21 0300 04/22/21 0745  BP: 135/69 131/66  Pulse: 77 78  Resp: 18 19  Temp: 98.4 F (36.9 C) 98.4 F (36.9 C)  SpO2: 93% 90%   Total I/O In: 3 [I.V.:3] Out: -   Intake/Output Summary (Last 24 hours) at 04/22/2021 1003 Last data filed at 04/22/2021 [id number] Gross per 24 hour  Intake 2375.51 ml  Output -203 ml  Net 2578.51 ml   Constitutional: Ill-appearing, lying in bed, no distress ENMT: ears and nose without scars or  lesions, MMM CV: normal rate, bilateral lower extremity edema worse on the left Respiratory: Bilateral chest rise with no increased work of breathing Gastrointestinal: soft, non-tender, no palpable masses or hernias Skin: no visible lesions or rashes Psych: Awake, alert, answers questions appropriately, appears less confused   Test Results I personally reviewed new and old clinical labs and radiology tests Lab Results  Component Value Date   NA 137 04/22/2021   K 3.5 04/22/2021   CL 103 04/22/2021   CO2 24 04/22/2021   BUN 58 (H) 04/22/2021   CREATININE 2.83 (H) 04/22/2021   CALCIUM 8.2 (L) 04/22/2021   ALBUMIN 3.2 (L) 04/08/2021   PHOS 5.9 (H) 04/18/2021

## 2021-04-22 NOTE — Progress Notes (Signed)
LLE venous duplex has been completed.  Results can be found under chart review under CV PROC. 04/22/2021 11:07 AM Monika Chestang RVT, RDMS

## 2021-04-23 ENCOUNTER — Other Ambulatory Visit: Payer: Self-pay

## 2021-04-23 DIAGNOSIS — Z515 Encounter for palliative care: Secondary | ICD-10-CM | POA: Diagnosis not present

## 2021-04-23 DIAGNOSIS — I70229 Atherosclerosis of native arteries of extremities with rest pain, unspecified extremity: Secondary | ICD-10-CM | POA: Diagnosis not present

## 2021-04-23 DIAGNOSIS — I120 Hypertensive chronic kidney disease with stage 5 chronic kidney disease or end stage renal disease: Secondary | ICD-10-CM | POA: Diagnosis not present

## 2021-04-23 DIAGNOSIS — Z7189 Other specified counseling: Secondary | ICD-10-CM | POA: Diagnosis not present

## 2021-04-23 LAB — GLUCOSE, CAPILLARY
Glucose-Capillary: 121 mg/dL — ABNORMAL HIGH (ref 70–99)
Glucose-Capillary: 143 mg/dL — ABNORMAL HIGH (ref 70–99)
Glucose-Capillary: 105 mg/dL — ABNORMAL HIGH (ref 70–99)
Glucose-Capillary: 120 mg/dL — ABNORMAL HIGH (ref 70–99)

## 2021-04-23 LAB — BASIC METABOLIC PANEL
Anion gap: 12 (ref 5–15)
BUN: 56 mg/dL — ABNORMAL HIGH (ref 8–23)
CO2: 22 mmol/L (ref 22–32)
Calcium: 8.3 mg/dL — ABNORMAL LOW (ref 8.9–10.3)
Chloride: 101 mmol/L (ref 98–111)
Creatinine, Ser: 2.01 mg/dL — ABNORMAL HIGH (ref 0.61–1.24)
GFR, Estimated: 34 mL/min — ABNORMAL LOW (ref >60)
Glucose, Bld: 108 mg/dL — ABNORMAL HIGH (ref 70–99)
Potassium: 3.3 mmol/L — ABNORMAL LOW (ref 3.5–5.1)
Sodium: 135 mmol/L (ref 135–145)

## 2021-04-23 LAB — CBC
HCT: 32.3 % — ABNORMAL LOW (ref 39.0–52.0)
Hemoglobin: 10.2 g/dL — ABNORMAL LOW (ref 13.0–17.0)
MCH: 25.9 pg — ABNORMAL LOW (ref 26.0–34.0)
MCHC: 31.6 g/dL (ref 30.0–36.0)
MCV: 82 fL (ref 80.0–100.0)
Platelets: 217 10*3/uL (ref 150–400)
RBC: 3.94 MIL/uL — ABNORMAL LOW (ref 4.22–5.81)
RDW: 19.3 % — ABNORMAL HIGH (ref 11.5–15.5)
WBC: 11 10*3/uL — ABNORMAL HIGH (ref 4.0–10.5)
nRBC: 0 % (ref 0.0–0.2)

## 2021-04-23 LAB — MAGNESIUM: Magnesium: 2.4 mg/dL (ref 1.7–2.4)

## 2021-04-23 LAB — PROTIME-INR
INR: 1.4 — ABNORMAL HIGH (ref 0.8–1.2)
Prothrombin Time: 16.7 seconds — ABNORMAL HIGH (ref 11.4–15.2)

## 2021-04-23 MED ORDER — POTASSIUM CHLORIDE CRYS ER 20 MEQ PO TBCR
20.0000 meq | EXTENDED_RELEASE_TABLET | Freq: Once | ORAL | Status: AC
  Administered 2021-04-23: 20 meq via ORAL
  Filled 2021-04-23: qty 1

## 2021-04-23 MED ORDER — POTASSIUM CHLORIDE CRYS ER 20 MEQ PO TBCR: 40.0000 meq | EXTENDED_RELEASE_TABLET | Freq: Once | ORAL | Status: DC

## 2021-04-23 MED ORDER — NEPRO/CARBSTEADY PO LIQD
237.0000 mL | Freq: Three times a day (TID) | ORAL | Status: DC
  Administered 2021-04-23 – 2021-04-29 (×14): 237 mL via ORAL

## 2021-04-23 NOTE — Progress Notes (Signed)
Mobility Specialist: Progress Note   04/23/21 1814  Mobility  Activity  (Bed Exercises)  Level of Assistance Minimal assist, patient does 75% or more  Assistive Device None  Mobility Sit up in bed/chair position for meals  Mobility Response Tolerated well  Mobility performed by Mobility specialist  $Mobility charge 1 Mobility   Pre-Mobility: 76 HR, 93% SpO2 Post-Mobility: 78 HR, 150/77 BP, 93% SpO2  Pt c/o 7/10 LLE pain during bed exercises, otherwise asx. Pt required minA with LLE exercises and was min guard for RLE. Pt set-up with his dinner and has call bell in his lap.   [facility] Ryan [name] Mobility Specialist Mobility Specialist Phone: [phone]

## 2021-04-23 NOTE — Patient Outreach (Signed)
[facility]) Care Management  04/23/2021  HAYAAN TY 04-29-1946 [id number]   Date of Review: 04/23/21 Reason:  Readmission  PCP: Dr. [name] Insurance: [facility]  Medical Info: Gilroy Mckellips is an 75 y.o. male past medical history significant for atrial fibrillation on Coumadin mitral valve replacement, chronic systolic heart failure with an AICD in place, essential hypertension, peripheral vascular disease, also history of peripheral vascular bypass with an ongoing wound infection.     Admissions: Patient with 2 admissions in 30 days  Patient admitted 03/06/21 to 03/12/21 presented with non-healing wound of right foot.   He underwent a right above knee popliteal artery-peroneal bypass.  He was discharged to a SNF.    Patient admitted from SNF on 04/08/21 for nonhealing right foot ulcer.  Patient was scheduled for debridement of wound.  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 Patient currently on dialysis and has left nephrostomy tube.    Disposition: SNF recommendation.  [facility] will continue to follow while inpatient.  Jone Baseman, RN, MSN [facility] Care Management Coordinator Direct Line [phone] Cell [phone] Toll Free: [phone]  Fax: [phone]

## 2021-04-23 NOTE — Progress Notes (Signed)
   VASCULAR SURGERY ASSESSMENT & PLAN:   PAD with critical lower extremity ischemia. Most recently underwent aortogram for left great toe ulceration without intervention 04/13/2021. He is s/p right heel debridement with placement of Myraid graft and VAC dressing; partial amp of right GT and 2nd toe amp 04/09/2021. VAC change tomorrow.  Left retroperitoneal hematoma>>stable H and H; continues to improve. Tolerating diet.  SUBJECTIVE:   Sitting up in bed eating breakfast. Says he has intermittent shooting pain in right foot.  PHYSICAL EXAM:   Vitals:   04/22/21 2009 04/22/21 2300 04/23/21 0414 04/23/21 0741  BP: 139/64 (!) 146/69 133/64 (!) 132/59  Pulse: 84 80 82 78  Resp: '19 18 18 19  '$ Temp: 99 F (37.2 C) 98.9 F (37.2 C) 98.2 F (36.8 C) 98.6 F (37 C)  TempSrc: Oral Oral Oral Oral  SpO2: 90% 92% 94% 92%  Weight:   90.3 kg   Height:       General appearance: Awake, alert in no apparent distress Cardiac: Heart rate slightly elevated, rhythm regular Respirations: Nonlabored Extremities: Right foot: great toe amp site and dorsal skin of third toe with ischemic skin changes/eschar. Serosang drainage from second toe amp incision. VAC dressing in place right heel. Pulse/Doppler exam: Unable to obtain right AT doppler signal. VAC dressing interferes with obtaining PT signal. Brisk left PT signal.  DP signal present  Wound care RN changed VAC yesterday "Wound bed: Dark brown with green sutured material in wound bed.  Drainage (amount, consistency, odor) minimal dark effluent with musty odor"       LABS:   Lab Results  Component Value Date   WBC 11.0 (H) 04/23/2021   HGB 10.2 (L) 04/23/2021   HCT 32.3 (L) 04/23/2021   MCV 82.0 04/23/2021   PLT 217 04/23/2021   Lab Results  Component Value Date   CREATININE 2.01 (H) 04/23/2021   Lab Results  Component Value Date   INR 1.4 (H) 04/23/2021   CBG (last 3)  Recent Labs    04/22/21 1602 04/22/21 2225 04/23/21 0625   GLUCAP 119* 140* 121*    PROBLEM LIST:    Principal Problem:   Critical lower limb ischemia (HCC) Active Problems:   Chronic systolic heart failure (HCC)   Status post mitral valve replacement with bioprosthetic valve   Diabetes mellitus (HCC)   Essential hypertension   ICD (implantable cardioverter-defibrillator) in place   Stage 3 chronic kidney disease (HCC)   Pressure injury of skin   CURRENT MEDS:    (feeding supplement) PROSource Plus  30 mL Oral BID BM   amiodarone  200 mg Oral Daily   atorvastatin  40 mg Oral q1800   B-complex with vitamin C  1 tablet Oral Daily   Chlorhexidine Gluconate Cloth  6 each Topical Q0600   docusate sodium  100 mg Oral BID   feeding supplement (NEPRO CARB STEADY)  237 mL Oral BID BM   insulin aspart  0-15 Units Subcutaneous TID WC   midodrine  10 mg Oral TID WC   pantoprazole  40 mg Oral Daily   potassium chloride  20 mEq Oral Once   sodium chloride flush  3 mL Intravenous Q12H   Barbie Banner, PA-C  Office: [phone] 04/23/2021

## 2021-04-23 NOTE — Progress Notes (Signed)
Nutrition Follow Up  DOCUMENTATION CODES:   Not applicable  INTERVENTION:   Increase Nepro Shake po TID, each supplement provides 425 kcal and 19 grams protein  Continue 30 ml ProSource Plus BID, each supplement provides 100 kcals and 15 grams protein.  Continue B complex with Vitamin C  NUTRITION DIAGNOSIS:   Increased nutrient needs related to post-op healing as evidenced by estimated needs.  Ongoing  GOAL:   Patient will meet greater than or equal to 90% of their needs  Progressing   MONITOR:   PO intake, Supplement acceptance, Diet advancement, Labs, Weight trends, Skin, I & O's  REASON FOR ASSESSMENT:   Consult Assessment of nutrition requirement/status, Wound healing  ASSESSMENT:   Ryan [name] is a 76 y.o. male with medical history significant of afib; MVR; chronic systolic CHF with AICD in place; DM; HTN; and PVD presenting for foot debridement/possible BKA.    He has had difficulty with a heel ulcer, not healing up well.  He was coming in today for surgery to have it cleaned up, possible amputation.  Unable to do the surgery because his INR was too high.  He takes Coumadin for afib.  Some days he feels ok and some he doesn't.  He has not feeling like he had a fever. Pt admitted rt foot wounds with critical limb ischemia.   7/21- s/p first/second R toe amputations, R heel excisional debridement, VAC placement 7/25- s/p LLE angiography 8/01- s/p nephrostomy tube, start iHD for AKI  Creatinine and UOP both improved. Holding HD for now to monitor for renal recovery.   Appetite progressing back to baseline. Consumed 100% of two meals yesterday and ate breakfast this am. RD observed Nepro shake from this am 100% completed at bedside. Encouraged continued meal and supplement intake to avoid nutrition support.   Admission weight: 73.1 kg  Current weight: 90.3 kg   UOP: 825 ml x 24 hrs Wound VAC R ankle: 0 ml documented    Drips: NS @ 65 ml/hr  Medications:  colace, SS novolog Labs: K 3.3 (L) Cr 2.01- down from yesterday   Diet Order:   Diet Order             Diet renal/carb modified with fluid restriction Diet-HS Snack? Nothing; Fluid restriction: 1800 mL Fluid; Room service appropriate? Yes with Assist; Fluid consistency: Thin  Diet effective now                   EDUCATION NEEDS:   Education needs have been addressed  Skin:  Skin Assessment: Skin Integrity Issues: Skin Integrity Issues:: Other (Comment) Other: rt foot wound with critical limb ischemia, L heel and L flank  Stage II: medial coccyx  Last BM:  8/2  Height:   Ht Readings from Last 1 Encounters:  04/08/21 6' (1.829 m)    Weight:   Wt Readings from Last 1 Encounters:  04/23/21 90.3 kg    Ideal Body Weight:  80.9 kg  BMI:  Body mass index is 26.99 kg/m.  Estimated Nutritional Needs:   Kcal:  2200-2400  Protein:  105-120 grams  Fluid:  > 2 L  Ryan Steffenhagen MS, RD, LDN, CNSC Clinical Nutrition Pager listed in [place]

## 2021-04-23 NOTE — Care Management Important Message (Signed)
Important Message  Patient Details  Name: Ryan [name] MRN: [id number] Date of Birth: [birth year]   Medicare Important Message Given:  Yes     Shelda Altes 04/23/2021, 10:29 AM

## 2021-04-23 NOTE — Progress Notes (Signed)
Daily Progress Note   Patient Name: Ryan [name]       Date: 04/23/2021 DOB: [birth year]  Age: 75 y.o. MRN#: [id number] Attending Physician: Damita Lack, MD Primary Care Physician: [name], MD Admit Date: 04/08/2021  Reason for Consultation/Follow-up: Establishing goals of care  Subjective:  Patient is verbalizing discomfort/back and legs.  Shifting frequently in bed.  Notified nurse, to bring Norco to bedside now.   Length of Stay: 15  Physical Exam HENT:     Head: Normocephalic.  Cardiovascular:     Rate and Rhythm: Normal rate.  Pulmonary:     Effort: Pulmonary effort is normal.  Abdominal:     Palpations: Abdomen is soft.  Neurological:     Mental Status: He is alert and oriented to person, place, and time.            Vital Signs: BP 139/78 (BP Location: Left Arm)   Pulse 85   Temp 98.4 F (36.9 C) (Oral)   Resp 20   Ht 6' (1.829 m)   Wt 90.3 kg   SpO2 92%   BMI 26.99 kg/m  SpO2: SpO2: 92 % O2 Device: O2 Device: Room Air O2 Flow Rate: O2 Flow Rate (L/min): 2 L/min   Palliative Care Assessment & Plan   HPI: 75 y.o. male  with past medical history of atrial fibrillation on Coumadin, mitral valve replacement, chronic systolic heart failure with an AICD, essential hypertension, peripheral vascular disease, also history of peripheral vascular bypass with an ongoing wound infection admitted on 04/08/2021 for wound debridement. 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 Dropping hgb requiring transfusion - CT 7/30 revealed retroperitoneal hemorrhage.  PMT consulted to discuss [place] with family.  Worsening renal function HD initiated, kidney function seems to be improving.   Goals of Care/Plan:  Continued  conversation with patient regarding current medical situation.    He verbalizes a basic understanding of his current medical situation specific to his worsening renal function and initiation of hemodialysis, hopefully this is temporary with kidney function improvement          Patient has multiple comorbidities.  Mr. Lashua states that "all of this worries me and makes me depressed".  I shared with patient the importance of continued conversation between himself and his family and the medical providers regarding viable treatment options, advanced directives and anticipatory care needs.  Patient was living alone prior to this hospitalization.  He tells me that independence is a priority to him.   He is considering moving to be closer to his son in [name]   Recommendations/Plan: Continue all aggressive measures, hopeful for improvement Full code/full scope   Code Status: Full code  Prognosis:  Unable to determine  Discharge Planning: To Be Determined   Thank you for allowing the Palliative Medicine Team to assist in the care of this patient.  Discussed with dr Reesa Chew.      This nurse practitioner informed  the patient and the attending that I will be out of the hospital until Monday morning.  If the patient is still hospitalized I will follow-up at that time.  Call palliative  medicine team phone # [phone] with questions or concerns in the interim   Wadie Lessen NP  Palliative Medicine Team Team Phone # [phone] Pager [phone]  35 minutes spent on the unit.  Greater than 50% of the time was spent in counseling  and coordination of care.

## 2021-04-23 NOTE — Progress Notes (Signed)
Physical Therapy Treatment Patient Details Name: Ryan [name] MRN: [id number] DOB: May 01, 1946 Today's Date: 04/23/2021    History of Present Illness 75 y.o. 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 Scheduled to have debridement on 7/20 but due to an INR of 3.2 surgery was postponed. He eventually underwent partial foot/toe amputation with excisional debridement and wound VAC placement on 7/21, left lower extremity angiogram in 7/25, and R IJ temp HD catheter placement on 8/1.  Pt with L sided hydronephrosis possibly secondary to L retroperitoneal hematoma, s/p nephrostomy tube on 8/1 to drain.  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    PT Comments    Pt is slow to progress toward goals, limited by pain arguably worse on the L LE than the new surgical R foot.  Emphasis today on ROM/strengthening exercise for bil LEs and UE's, transitions to EOB, sitting balance at EOB with hip exercise for better positioning LE's to stand, sit to stands x2 with face to face assist and scoot transfers toward [facility] before return to supine.  Pt deferred OOB  to chair today due to the pain.    Follow Up Recommendations  SNF     Equipment Recommendations  Other (comment) (TBA)    Recommendations for Other Services       Precautions / Restrictions Precautions Precautions: Fall Precaution Comments: chronic back pain, back precs for comfort Other Brace: L nephrostomy tube/drain    Mobility  Bed Mobility Overal bed mobility: Needs Assistance Bed Mobility: Sit to Supine;Supine to Sit     Supine to sit: Max assist Sit to supine: Mod assist   General bed mobility comments: Warmed up before mobility. cued for sequencing, assisted LE's off the bed with assist of draw padding.  truncal assist up via R elbow and pivot with  padding.    Transfers Overall transfer level: Needs assistance   Transfers: Sit to/from Stand;Lateral/Scoot Transfers Sit to Stand: Mod assist;Max assist        Lateral/Scoot Transfers: Max assist;+2 safety/equipment General transfer comment: While at EOB, used sit to stand for padding replacement x1 and scoot transfer back and up toward [facility] prior to return to supine.  Ambulation/Gait             General Gait Details: pt unable today due to new amputations and resulting pain.   Stairs             Wheelchair Mobility    Modified Rankin (Stroke Patients Only)       Balance Overall balance assessment: Needs assistance Sitting-balance support: Feet supported;Bilateral upper extremity supported;No upper extremity supported Sitting balance-Leahy Scale: Poor (to fair) Sitting balance - Comments: pt sat EOB for >10 min working  on sitting balance/tolerance, completing 10 hip adduction and IR exercises to work on improved LE positioning for standing..   Standing balance support: Bilateral upper extremity supported;During functional activity Standing balance-Leahy Scale: Poor Standing balance comment: sit to stand x2 into a submaximal upright stance while pads replaced in prep for returning to supine.                            Cognition Arousal/Alertness: Awake/alert Behavior During Therapy: WFL for tasks assessed/performed Overall Cognitive Status: No family/caregiver present to determine baseline cognitive functioning  Exercises Other Exercises Other Exercises: hip/knee flexion/ext exercise with graded assist and resistance as pt tolerated bil Other Exercises: bicep/tricep preses bil x10 reps with graded resistance.    General Comments General comments (skin integrity, edema, etc.): vss throughout session      Pertinent Vitals/Pain Pain Assessment: Faces Faces Pain Scale: Hurts even  more Pain Location: R foot, L leg Pain Descriptors / Indicators: Discomfort;Grimacing;Guarding;Sore Pain Intervention(s): Monitored during session;Patient requesting pain meds-RN notified    Home Living                      Prior Function            PT Goals (current goals can now be found in the care plan section) Acute Rehab PT Goals Patient Stated Goal: none stated today PT Goal Formulation: With patient/family Time For Goal Achievement: 04/29/21 Potential to Achieve Goals: Fair Progress towards PT goals: Progressing toward goals    Frequency    Min 2X/week      PT Plan Current plan remains appropriate    Co-evaluation              AM-PAC PT "6 Clicks" Mobility   Outcome Measure  Help needed turning from your back to your side while in a flat bed without using bedrails?: A Lot Help needed moving from lying on your back to sitting on the side of a flat bed without using bedrails?: A Lot Help needed moving to and from a bed to a chair (including a wheelchair)?: Total Help needed standing up from a chair using your arms (e.g., wheelchair or bedside chair)?: Total Help needed to walk in hospital room?: Total Help needed climbing 3-5 steps with a railing? : Total 6 Click Score: 8    End of Session   Activity Tolerance: Patient limited by pain;Patient tolerated treatment well;Patient limited by fatigue Patient left: in bed;with call bell/phone within reach;with family/visitor present Nurse Communication: Mobility status PT Visit Diagnosis: Unsteadiness on feet (R26.81);Other abnormalities of gait and mobility (R26.89);Muscle weakness (generalized) (M62.81);Pain Pain - Right/Left:  (bil) Pain - part of body:  (R foot, L LEg)     Time: [id number] PT Time Calculation (min) (ACUTE ONLY): 44 min  Charges:  $Therapeutic Exercise: 8-22 mins $Therapeutic Activity: 23-37 mins                     04/23/2021  Ryan Carne., PT Acute Rehabilitation  Services [phone]  (pager) [phone]  (office)   Ryan [name] 04/23/2021, 1:54 PM

## 2021-04-23 NOTE — Progress Notes (Signed)
Nephrology Follow-Up Consult note   Assessment/Recommendations: Ryan [name] is a/an 75 y.o. 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  AKI now with likely ATN and some degree of obstruction related to left-sided hydronephrosis status post nephrostomy tube on 8/1.  Uremia has improved after HD 8/3.  Creatinine improved again today to 2 with improving urine output.  Likely resolving AKI from ATN -Appreciate help from IR with temporary dialysis catheter and nephrostomy tube -Hold further dialysis given his improvement -If creatinine continues to be stable or improved tomorrow plan to remove dialysis catheter -Continue to monitor daily Cr, Dose meds for GFR -Monitor Daily I/Os, Daily weight  -Maintain MAP>65 for optimal renal perfusion.  -Avoid nephrotoxic medications including NSAIDs and Vanc/Zosyn combo  Left-sided hydronephrosis: This is secondary to retroperitoneal bleed causing obstruction.   -Appreciate help from IR and urology -Urology recommends conversion to antegrade stent in the future w/ IR; possibly next week once the patient has continued to be stable  Right critical lower limb ischemia: Continue dressing changes and management per primary team with vascular involved  LLE pain: and swelling.  Possibly related to hematoma compression causing poor venous drainage from the leg.  No signs of DVT fortunately  Retroperitoneal bleed: Hemoglobin now stable.  Management per primary  Systolic heart failure: On midodrine.  Holding Lasix.  Monitor volume status closely  DM2: Continue sliding scale per primary team   Recommendations conveyed to primary service.    [facility] 04/23/2021 9:50  AM  ___________________________________________________________  CC: AKI on CKD  Interval History/Subjective: Patient states he feels okay but continues to have pain on his left leg.  PVL was ordered with no DVT found.  Hemoglobin stable today.  Creatinine improving.  Urine output improving   Medications:  Current Facility-Administered Medications  Medication Dose Route Frequency Provider Last Rate Last Admin   (feeding supplement) PROSource Plus liquid 30 mL  30 mL Oral BID BM Charlynne Cousins, MD   30 mL at 04/23/21 0835   0.9 %  sodium chloride infusion   Intravenous Continuous Roney Jaffe, MD 65 mL/hr at 04/23/21 0101 New Bag at 04/23/21 0101   acetaminophen (TYLENOL) tablet 650 mg  650 mg Oral Q8H PRN Shela Leff, MD   650 mg at 04/23/21 0842   alum & mag hydroxide-simeth (MAALOX/MYLANTA) 200-200-20 MG/5ML suspension 30 mL  30 mL Oral Q4H PRN Charlynne Cousins, MD   30 mL at 04/17/21 0144   amiodarone (PACERONE) tablet 200 mg  200 mg Oral Daily Waynetta Sandy, MD   200 mg at 04/23/21 0834   atorvastatin (LIPITOR) tablet 40 mg  40 mg Oral q1800 Waynetta Sandy, MD   40 mg at 04/22/21 1718   B-complex with vitamin C tablet 1 tablet  1 tablet Oral Daily Charlynne Cousins, MD   1 tablet at 04/23/21 0834   bisacodyl (DULCOLAX) EC tablet 5 mg  5 mg Oral Daily PRN Waynetta Sandy, MD   5 mg at 04/17/21 0047   cefTRIAXone (ROCEPHIN) 1 g in sodium chloride 0.9 % 100 mL IVPB  1 g Intravenous Q24H Reesa Chew, MD 200 mL/hr at 04/23/21 0847 1 g at 04/23/21 0847   Chlorhexidine Gluconate Cloth 2 % PADS 6 each  6 each Topical Q0600 Charlynne Cousins, MD   6 each at 04/23/21 0515   dextromethorphan-guaiFENesin ([place] DM) 30-600 MG per 12 hr tablet 1 tablet  1 tablet Oral BID PRN Amin, Ankit Chirag, MD       docusate sodium (COLACE) capsule 100 mg  100 mg Oral BID Waynetta Sandy, MD   100 mg at 04/23/21 0834   feeding supplement  (NEPRO CARB STEADY) liquid 237 mL  237 mL Oral BID BM Charlynne Cousins, MD   237 mL at 04/23/21 0835   hydrALAZINE (APRESOLINE) injection 10 mg  10 mg Intravenous Q4H PRN Damita Lack, MD       HYDROcodone-acetaminophen (NORCO) 7.5-325 MG per tablet 1 tablet  1 tablet Oral Q6H PRN Charlynne Cousins, MD   1 tablet at 04/23/21 0504   insulin aspart (novoLOG) injection 0-15 Units  0-15 Units Subcutaneous TID WC Waynetta Sandy, MD   2 Units at 04/22/21 1207   metoprolol tartrate (LOPRESSOR) injection 5 mg  5 mg Intravenous Q4H PRN Amin, Ankit Chirag, MD       midodrine (PROAMATINE) tablet 10 mg  10 mg Oral TID WC Charlynne Cousins, MD   10 mg at 04/23/21 0834   ondansetron (ZOFRAN) injection 4 mg  4 mg Intravenous Q6H PRN Waynetta Sandy, MD   4 mg at 04/17/21 1611   ondansetron (ZOFRAN) tablet 4 mg  4 mg Oral Q6H PRN Waynetta Sandy, MD   4 mg at 04/16/21 1212   pantoprazole (PROTONIX) EC tablet 40 mg  40 mg Oral Daily Waynetta Sandy, MD   40 mg at 04/23/21 0834   polyethylene glycol (MIRALAX / GLYCOLAX) packet 17 g  17 g Oral Daily PRN Vernelle Emerald, MD   17 g at 04/16/21 2147   senna-docusate (Senokot-S) tablet 1 tablet  1 tablet Oral QHS PRN Amin, Ankit Chirag, MD       sodium chloride flush (NS) 0.9 % injection 3 mL  3 mL Intravenous Q12H Waynetta Sandy, MD   3 mL at 04/22/21 2137   sodium chloride flush (NS) 0.9 % injection 3 mL  3 mL Intravenous PRN Waynetta Sandy, MD       traZODone (DESYREL) tablet 50 mg  50 mg Oral QHS PRN Amin, Jeanella Flattery, MD          Review of Systems: 10 systems reviewed and negative except per interval history/subjective  Physical Exam: Vitals:   04/23/21 0414 04/23/21 0741  BP: 133/64 (!) 132/59  Pulse: 82 78  Resp: 18 19  Temp: 98.2 F (36.8 C) 98.6 F (37 C)  SpO2: 94% 92%   Total I/O In: 240 [P.O.:240] Out: -   Intake/Output Summary (Last 24 hours) at 04/23/2021  0950 Last data filed at 04/23/2021 0840 Gross per 24 hour  Intake 720 ml  Output 825 ml  Net -105 ml   Constitutional: lying in bed, no distress ENMT: ears and nose without scars or lesions, MMM CV: normal rate, bilateral lower extremity edema worse on the left Respiratory: Bilateral chest rise with no increased work of breathing Gastrointestinal: soft, non-tender, no palpable masses or hernias Skin: Wound on foot with some necrosis, otherwise no visible lesions or rashes Psych: Awake, alert, answers questions appropriatel   Test Results I personally reviewed new and old clinical labs and radiology tests Lab Results  Component Value Date   NA 135 04/23/2021   K 3.3 (L) 04/23/2021  CL 101 04/23/2021   CO2 22 04/23/2021   BUN 56 (H) 04/23/2021   CREATININE 2.01 (H) 04/23/2021   CALCIUM 8.3 (L) 04/23/2021   ALBUMIN 3.2 (L) 04/08/2021   PHOS 5.9 (H) 04/18/2021

## 2021-04-23 NOTE — Progress Notes (Signed)
PROGRESS NOTE    Ryan [name]  [id number] DOB: April 14, 1946 DOA: 04/08/2021 PCP: [name], MD   Brief Narrative:  75 y.o. 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  Scheduled to have debridement on 04/08/2021 but due to an INR of 3.2 surgery was postponed.  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  He has completed his course of antibiotics in house last day was 04/13/2019.  He developed a retroperitoneal hematoma with left kidney displacement and hypotension developed ATN and hyperkalemia, nephrology was consulted recommended renal replacement therapy, urology was consulted due to his left hydronephrosis would like recommended a left nephrostomy tube.  Nephrostomy tube placed on 8/1.   Assessment & Plan:   Principal Problem:   Critical lower limb ischemia (HCC) Active Problems:   Chronic systolic heart failure (HCC)   Status post mitral valve replacement with bioprosthetic valve   Diabetes mellitus ([place])   Essential hypertension   ICD (implantable cardioverter-defibrillator) in place   Stage 3 chronic kidney disease ([name])   Pressure injury of skin   Right critical lower limb ischemia [facility]): Surgery-recommends dressing changes, wound care.  Wound VAC in place.  Plans to DC him with wound VAC in place to SNF.  Follow-up outpatient LE Dopplers-negative for DVT   New retroperitoneal hematoma/acute blood loss anemia: -CT scan of the abdomen and pelvis showed new retroperitoneal hematoma he was given vitamin K and fresh frozen plasma, as his INR 1.3 closely monitor hemoglobin level.   Transfuse as needed.   ATN on chronic kidney disease stage III: Creatinine peaked at 5.63 -Secondary to hypotension, retroperitoneal bleed - Status post HD 8/2.  Creatinine 2.01 - Seen by nephro and urology.  Percutaneous left nephrostomy tube placement and right IJ HD placement 8/1.  Transition to anterograde urethral stent when appropriate with IR.   Junctional rhythm due to hyperkalemia: -Monitor electrolytes   Chronic systolic heart failure ([name]), EF 40-45% -Continue midodrine.  Lasix on hold.  Echo 03/04/2021-EF 40-45%, global hypokinesis.  Follow-up outpatient CHF   Permanent atrial fibrillation/Status post mitral valve replacement with bioprosthetic valve: Bioprosthetic valve. -Due to ongoing bleeding Coumadin and heparin on hold.   Diabetes mellitus type 2: -Hemoglobin A1c 5.4.  Insulin sliding scale and Accu-Cheks  Essential HTN: -Currently patient is on amiodarone and midodrine 10 mg 3 times daily  Hyperlipidemia: -Lipitor 40 mg daily   Ethics/goals of care: Palliative Care is being consulted    DVT prophylaxis:   No anticoagulation due to ongoing bleeding.  Unable to place SCD due to anatomy and wound care issues Code Status: Full code Family Communication:    Status is: Inpatient  Remains inpatient appropriate because:IV treatments appropriate due to intensity of illness or inability to take PO  Dispo: The patient is from: Home              Anticipated d/c is to: SNF              Patient currently is not medically stable to d/c.  Maintain hospital stay until cleared by vascular and nephrology   Difficult to place patient No       Nutritional status  Nutrition Problem: Increased nutrient needs Etiology: post-op  healing  Signs/Symptoms: estimated needs  Interventions: MVI, Juven, Magic cup, Premier Protein  Body mass index is 26.99 kg/m.  Pressure Injury 04/21/21 Coccyx Medial Stage 2 -  Partial thickness loss of dermis presenting as a shallow  open injury with a red, pink wound bed without slough. (Active)  04/21/21 1255  Location: Coccyx  Location Orientation: Medial  Staging: Stage 2 -  Partial thickness loss of dermis presenting as a shallow open injury with a red, pink wound bed without slough.  Wound Description (Comments):   Present on Admission:           Subjective: Doing ok, just very uncomfortable in the bed. No other complaints for now.   Review of Systems Otherwise negative except as per HPI, including: General = no fevers, chills, dizziness,  fatigue HEENT/EYES = negative for loss of vision, double vision, blurred vision,  sore throa Cardiovascular= negative for chest pain, palpitation Respiratory/lungs= negative for shortness of breath, cough, wheezing; hemoptysis,  Gastrointestinal= negative for nausea, vomiting, abdominal pain Genitourinary= negative for Dysuria MSK = Negative for arthralgia, myalgias Neurology= Negative for headache, numbness, tingling  Psychiatry= Negative for suicidal and homocidal ideation Skin= Negative for Rash   Examination: Constitutional: Not in acute distress Respiratory: Clear to auscultation bilaterally Cardiovascular: Normal sinus rhythm, no rubs Abdomen: Nontender nondistended good bowel sounds Musculoskeletal: No edema noted Skin: LE dressing noted at the surgical site.  Neurologic: CN 2-12 grossly intact.  And nonfocal Psychiatric: Normal judgment and insight. Alert and oriented x 3. Normal mood.     Objective: Vitals:   04/22/21 2009 04/22/21 2300 04/23/21 0414 04/23/21 0741  BP: 139/64 (!) 146/69 133/64 (!) 132/59  Pulse: 84 80 82 78  Resp: '19 18 18 19  '$ Temp: 99 F (37.2 C) 98.9 F (37.2 C) 98.2 F (36.8 C) 98.6 F (37 C)  TempSrc: Oral Oral Oral Oral  SpO2: 90% 92% 94% 92%  Weight:   90.3 kg   Height:        Intake/Output Summary (Last 24 hours) at 04/23/2021 0834 Last data filed at 04/23/2021 0500 Gross per 24 hour  Intake 483 ml  Output 825  ml  Net -342 ml   Filed Weights   04/21/21 0815 04/22/21 0300 04/23/21 0414  Weight: 85.4 kg 86.8 kg 90.3 kg     Data Reviewed:   CBC: Recent Labs  Lab 04/20/21 0158 04/20/21 2052 04/21/21 0440 04/21/21 1307 04/22/21 0435 04/23/21 0209  WBC 13.9*  --  11.4* 10.7* 9.5 11.0*  HGB 5.8* 7.1* 6.9* 9.5* 9.4* 10.2*  HCT 18.6* 22.9* 21.5* 29.3* 29.7* 32.3*  MCV 79.8*  --  80.8 82.3 81.4 82.0  PLT 251  --  230 202 207 [id number]   Basic Metabolic Panel: Recent Labs  Lab 04/18/21 2207 04/19/21 0150 04/19/21 0559 04/20/21 0158 04/21/21 0440 04/22/21 0435 04/23/21 0209  NA 135   < > 132* 134* 135 137 135  K 6.1*   < > 5.6* 5.5* 4.5 3.5 3.3*  CL 102   < > 100 101 99 103 101  CO2 22   < > 20* '22 22 24 22  '$ GLUCOSE 137*   < > 122* 119* 108* 96 108*  BUN 51*   < > 59* 74* 89* 58* 56*  CREATININE 4.64*   < > 5.12* 5.63* 5.07* 2.83* 2.01*  CALCIUM 8.1*   < > 8.2* 8.5* 8.4* 8.2* 8.3*  MG  --   --   --   --   --   --  2.4  PHOS 5.9*  --   --   --   --   --   --    < > = values in this interval not displayed.   GFR: Estimated Creatinine Clearance: 33.8 mL/min (A) (by C-G formula based on SCr of 2.01 mg/dL (H)). Liver Function Tests: No results for input(s): AST, ALT, ALKPHOS, BILITOT, PROT, ALBUMIN in the last 168 hours. No results for input(s): LIPASE, AMYLASE in the last 168 hours. No results for input(s): AMMONIA in the last 168 hours. Coagulation Profile: Recent Labs  Lab 04/19/21 1548 04/20/21 0158 04/21/21 0440 04/22/21 0435 04/23/21 0209  INR 1.5* 1.3* 1.6* 1.4* 1.4*   Cardiac Enzymes: Recent Labs  Lab 04/18/21 2207 04/20/21 0158  CKTOTAL 3,479* 2,716*   BNP (last 3 results) Recent Labs    08/25/20 1121 03/27/21 1123  PROBNP 21,395* 3,146*   HbA1C: No results for input(s): HGBA1C in the last 72 hours. CBG: Recent Labs  Lab 04/22/21 0614 04/22/21 1135 04/22/21 1602 04/22/21 2225 04/23/21 0625  GLUCAP 105* 122* 119* 140* 121*   Lipid Profile: No  results for input(s): CHOL, HDL, LDLCALC, TRIG, CHOLHDL, LDLDIRECT in the last 72 hours. Thyroid Function Tests: No results for input(s): TSH, T4TOTAL, FREET4, T3FREE, THYROIDAB in the last 72 hours. Anemia Panel: No results for input(s): VITAMINB12, FOLATE, FERRITIN, TIBC, IRON, RETICCTPCT in the last 72 hours. Sepsis Labs: Recent Labs  Lab 04/18/21 2207  LATICACIDVEN 1.6    Recent Results (from the past 240 hour(s))  Urine Culture     Status: None   Collection Time: 04/20/21 11:47 AM   Specimen: Urine, Clean Catch  Result Value Ref Range Status   Specimen Description URINE, CLEAN CATCH  Final   Special Requests NONE  Final   Culture   Final    NO GROWTH Performed at [facility], [address] [place], [place] [address]    Report Status 04/21/2021 FINAL  Final         Radiology Studies: VAS [place] LOWER EXTREMITY VENOUS (DVT)  Result Date: 04/22/2021  Lower Venous DVT Study Patient Name:  TAMATOA PUTMAN  Date of Exam:   04/22/2021 Medical Rec #: [id number]         Accession #:    [id number] Date of Birth: [birth year]         Patient Gender: M Patient Age:   75Y Exam Location:  [facility] Procedure:      VAS [place] LOWER EXTREMITY VENOUS (DVT) Referring Phys: [id number] VASUNDHRA RATHORE --------------------------------------------------------------------------------  Indications: Left thigh pain & swelling. Other Indications: Recent LLE angiogram (04/13/21). Comparison Study: No previous exams Performing Technologist: Jody Hill RVT, RDMS  Examination Guidelines: A complete evaluation includes B-mode imaging, spectral Doppler, color Doppler, and power Doppler as needed of all accessible portions of each vessel. Bilateral testing is considered an integral part of a complete examination. Limited examinations for reoccurring indications may be performed as noted. The reflux portion of the exam is performed with the patient in reverse Trendelenburg.   +-----+---------------+---------+-----------+----------+--------------+ RIGHTCompressibilityPhasicitySpontaneityPropertiesThrombus Aging +-----+---------------+---------+-----------+----------+--------------+ CFV  Full           Yes      Yes                                 +-----+---------------+---------+-----------+----------+--------------+   +---------+---------------+---------+-----------+----------+--------------+ LEFT     CompressibilityPhasicitySpontaneityPropertiesThrombus Aging +---------+---------------+---------+-----------+----------+--------------+ CFV      Full  Yes      Yes                                 +---------+---------------+---------+-----------+----------+--------------+ SFJ      Full                                                        +---------+---------------+---------+-----------+----------+--------------+ FV Prox  Full           Yes      Yes                                 +---------+---------------+---------+-----------+----------+--------------+ FV Mid   Full           Yes      Yes                                 +---------+---------------+---------+-----------+----------+--------------+ FV DistalFull           Yes      Yes                                 +---------+---------------+---------+-----------+----------+--------------+ PFV      Full                                                        +---------+---------------+---------+-----------+----------+--------------+ POP      Full           Yes      Yes                                 +---------+---------------+---------+-----------+----------+--------------+ PTV      Full                                                        +---------+---------------+---------+-----------+----------+--------------+ PERO     Full                                                         +---------+---------------+---------+-----------+----------+--------------+     Summary: RIGHT: - No evidence of common femoral vein obstruction.  LEFT: - There is no evidence of deep vein thrombosis in the lower extremity. - There is no evidence of superficial venous thrombosis.  - No cystic structure found in the popliteal fossa. Subcutaneous edema in area of pain (LLE mid & distal thigh).  *See table(s) above for measurements and observations. Electronically signed by Harold Barban MD on 04/22/2021 at 11:53:43 AM.    Final  Scheduled Meds:  (feeding supplement) PROSource Plus  30 mL Oral BID BM   amiodarone  200 mg Oral Daily   atorvastatin  40 mg Oral q1800   B-complex with vitamin C  1 tablet Oral Daily   Chlorhexidine Gluconate Cloth  6 each Topical Q0600   docusate sodium  100 mg Oral BID   feeding supplement (NEPRO CARB STEADY)  237 mL Oral BID BM   insulin aspart  0-15 Units Subcutaneous TID WC   midodrine  10 mg Oral TID WC   pantoprazole  40 mg Oral Daily   potassium chloride  20 mEq Oral Once   sodium chloride flush  3 mL Intravenous Q12H   Continuous Infusions:  sodium chloride 65 mL/hr at 04/23/21 0101   cefTRIAXone (ROCEPHIN)  IV 1 g (04/22/21 0925)     LOS: 15 days   Time spent= 35 mins    Penny Arrambide Arsenio Loader, MD [facility]  If 7PM-7AM, please contact night-coverage  04/23/2021, 8:34 AM

## 2021-04-24 ENCOUNTER — Inpatient Hospital Stay (HOSPITAL_COMMUNITY): Payer: Medicare HMO

## 2021-04-24 DIAGNOSIS — I70261 Atherosclerosis of native arteries of extremities with gangrene, right leg: Secondary | ICD-10-CM

## 2021-04-24 DIAGNOSIS — T8753 Necrosis of amputation stump, right lower extremity: Secondary | ICD-10-CM

## 2021-04-24 DIAGNOSIS — I70229 Atherosclerosis of native arteries of extremities with rest pain, unspecified extremity: Secondary | ICD-10-CM | POA: Diagnosis not present

## 2021-04-24 LAB — CBC
HCT: 29.7 % — ABNORMAL LOW (ref 39.0–52.0)
Hemoglobin: 9.6 g/dL — ABNORMAL LOW (ref 13.0–17.0)
MCH: 26.4 pg (ref 26.0–34.0)
MCHC: 32.3 g/dL (ref 30.0–36.0)
MCV: 81.6 fL (ref 80.0–100.0)
Platelets: 224 10*3/uL (ref 150–400)
RBC: 3.64 MIL/uL — ABNORMAL LOW (ref 4.22–5.81)
RDW: 19.2 % — ABNORMAL HIGH (ref 11.5–15.5)
WBC: 11.2 10*3/uL — ABNORMAL HIGH (ref 4.0–10.5)
nRBC: 0 % (ref 0.0–0.2)

## 2021-04-24 LAB — BASIC METABOLIC PANEL
Anion gap: 7 (ref 5–15)
BUN: 45 mg/dL — ABNORMAL HIGH (ref 8–23)
CO2: 23 mmol/L (ref 22–32)
Calcium: 8 mg/dL — ABNORMAL LOW (ref 8.9–10.3)
Chloride: 107 mmol/L (ref 98–111)
Creatinine, Ser: 1.41 mg/dL — ABNORMAL HIGH (ref 0.61–1.24)
GFR, Estimated: 51 mL/min — ABNORMAL LOW (ref >60)
Glucose, Bld: 111 mg/dL — ABNORMAL HIGH (ref 70–99)
Potassium: 3.3 mmol/L — ABNORMAL LOW (ref 3.5–5.1)
Sodium: 137 mmol/L (ref 135–145)

## 2021-04-24 LAB — PROTIME-INR
INR: 1.4 — ABNORMAL HIGH (ref 0.8–1.2)
Prothrombin Time: 17.1 seconds — ABNORMAL HIGH (ref 11.4–15.2)

## 2021-04-24 LAB — GLUCOSE, CAPILLARY
Glucose-Capillary: 111 mg/dL — ABNORMAL HIGH (ref 70–99)
Glucose-Capillary: 117 mg/dL — ABNORMAL HIGH (ref 70–99)
Glucose-Capillary: 128 mg/dL — ABNORMAL HIGH (ref 70–99)
Glucose-Capillary: 118 mg/dL — ABNORMAL HIGH (ref 70–99)

## 2021-04-24 LAB — MAGNESIUM: Magnesium: 2.1 mg/dL (ref 1.7–2.4)

## 2021-04-24 MED ORDER — POTASSIUM CHLORIDE CRYS ER 20 MEQ PO TBCR
40.0000 meq | EXTENDED_RELEASE_TABLET | Freq: Once | ORAL | Status: AC
  Administered 2021-04-24: 40 meq via ORAL
  Filled 2021-04-24: qty 2

## 2021-04-24 NOTE — Progress Notes (Signed)
Order noted to dc R IJ TL [place]. VAST RN contacted Dr. Elmarie Shiley inquiring about length of time needed for antibiotic administration. Dr. Posey Pronto verbalized via [place] that if patient does not have reliable IV access, it is okay to leave temporary [place] in place until antibiotics are completed.  VAST RN will assess patient's vasculature to determine if PIV can be placed.

## 2021-04-24 NOTE — Progress Notes (Signed)
Saw on afternoon rounds. Reporting worsening abdominal pain. CT scan repeated - no interval change. Will continue local wound care to bilateral feet. Will take down Myriad treatment on Monday.  [name]. [name], MD [facility] Phone Number: [phone] 04/24/2021 4:22 PM

## 2021-04-24 NOTE — Progress Notes (Addendum)
Patient ID: Ryan [name], male   DOB: 02-09-1946, 75 y.o.   MRN: [id number] [facility] Progress Note   Assessment/ Plan:   1. Acute kidney Injury on chronic kidney disease stage IIIb: Etiology suspected to be from ATN along with unilateral obstruction from left-sided hydronephrosis status post nephrostomy tube on 8/1.  He underwent hemodialysis on 8/3 and has fair urine output with creatinine today down to 1.4 which is better than his documented baseline.  I will order for removal of dialysis catheter today with improving renal function and no indications for dialysis at this time. 2.  Left-sided hydronephrosis: Secondary to retroperitoneal bleed and status post percutaneous nephrostomy.  Plans noted for conversion to antegrade stent in the future.  Appreciate urology/IR 3.  Right lower limb critical ischemia: With wound VAC in place and ongoing dressing changes per vascular surgery recommendations. 4.  Hypokalemia: Secondary to renal wasting with alleviation of obstruction-cautious replacement via p.o. route. 5.  Chronic systolic heart failure: Diuretics currently on hold and I will discontinue intravenous fluids as he is not overtly polyuric. 6.  Atrial fibrillation status post mitral valve replacement with bioprosthetic valve  I will sign off at this time and remain available for questions/concerns regarding renal management  Subjective:   Reports some discomfort from positioning in bed   Objective:   BP 127/60 (BP Location: Right Arm)   Pulse (!) 108   Temp 98.2 F (36.8 C) (Oral)   Resp 19   Ht 6' (1.829 m)   Wt 90.3 kg   SpO2 93%   BMI 26.99 kg/m   Intake/Output Summary (Last 24 hours) at 04/24/2021 1036 Last data filed at 04/24/2021 0600 Gross per 24 hour  Intake --  Output 1000 ml  Net -1000 ml   Weight change: 0 kg  Physical Exam: Gen: Appears uncomfortable resting in bed, assisted with repositioning CVS: Pulse regular rhythm, normal rate, S1 and S2  normal Resp: Anteriorly clear to auscultation, no rales/rhonchi.  Right IJ temporary dialysis catheter Abd: Soft, obese, nontender, bowel sounds normal Ext: Right leg in dressing with wound VAC in situ, left leg trace edema.  Imaging: No results found.  Labs: BMET Recent Labs  Lab 04/18/21 2207 04/19/21 0150 04/19/21 0559 04/20/21 0158 04/21/21 0440 04/22/21 0435 04/23/21 0209 04/24/21 0320  NA 135 134* 132* 134* 135 137 135 137  K 6.1* 6.0* 5.6* 5.5* 4.5 3.5 3.3* 3.3*  CL 102 100 100 101 99 103 101 107  CO2 22 21* 20* '22 22 24 22 23  '$ GLUCOSE 137* 115* 122* 119* 108* 96 108* 111*  BUN 51* 54* 59* 74* 89* 58* 56* 45*  CREATININE 4.64* 4.82* 5.12* 5.63* 5.07* 2.83* 2.01* 1.41*  CALCIUM 8.1* 8.6* 8.2* 8.5* 8.4* 8.2* 8.3* 8.0*  PHOS 5.9*  --   --   --   --   --   --   --    CBC Recent Labs  Lab 04/21/21 1307 04/22/21 0435 04/23/21 0209 04/24/21 0320  WBC 10.7* 9.5 11.0* 11.2*  HGB 9.5* 9.4* 10.2* 9.6*  HCT 29.3* 29.7* 32.3* 29.7*  MCV 82.3 81.4 82.0 81.6  PLT 202 207 217 224    Medications:     (feeding supplement) PROSource Plus  30 mL Oral BID BM   amiodarone  200 mg Oral Daily   atorvastatin  40 mg Oral q1800   B-complex with vitamin C  1 tablet Oral Daily   Chlorhexidine Gluconate Cloth  6 each Topical Q0600  docusate sodium  100 mg Oral BID   feeding supplement (NEPRO CARB STEADY)  237 mL Oral TID BM   insulin aspart  0-15 Units Subcutaneous TID WC   midodrine  10 mg Oral TID WC   pantoprazole  40 mg Oral Daily   potassium chloride  40 mEq Oral Once   sodium chloride flush  3 mL Intravenous Q12H   Elmarie Shiley, MD 04/24/2021, 10:36 AM

## 2021-04-24 NOTE — Consult Note (Signed)
[place] Nurse Consult Note: Patient receiving care in [place]. Colleague, Marlou Porch assisted with positioning for right heel NPWT dressing change. Reason for Consult: NPWT dressing change to right heel surgical site Wound type: surgical Pressure Injury POA: Yes/No/NA Measurement: na Wound bed: could not be visualized. Myriad graft material covering wound bed.  Drainage (amount, consistency, odor) scant serosanginous in existing VAC cannister Periwound: On practically every bony prominence from the mid-tibia to the foot there are scattered purple discolorations. These can easily be seen if the Ace wrap is removed, many are visible through the drape. Dressing procedure/placement/frequency: Xeroform gauze removed from over the Myriad graft. New Xeroform gauze placed over the graft material. One piece of black foam placed over the wound, bridged to the dorsum of the foot. Drape applied, immediate seal obtained.  Patient tolerated well. Primary RN notified of the dressing change. Additional dressing kits requested for next week.  Val Riles, RN, MSN, CWOCN, CNS-BC, pager [phone]

## 2021-04-24 NOTE — Progress Notes (Signed)
PROGRESS NOTE    Ryan [name]  [id number] DOB: [birth year] DOA: 04/08/2021 PCP: [name], MD   Brief Narrative:  75 y.o. 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  Scheduled to have debridement on 04/08/2021 but due to an INR of 3.2 surgery was postponed.  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  He has completed his course of antibiotics in house last day was 04/13/2019.  He developed a retroperitoneal hematoma with left kidney displacement and hypotension developed ATN and hyperkalemia, nephrology was consulted recommended renal replacement therapy, urology was consulted due to his left hydronephrosis would like recommended a left nephrostomy tube.  Nephrostomy tube placed on 8/1.   Assessment & Plan:   Principal Problem:   Critical lower limb ischemia (HCC) Active Problems:   Chronic systolic heart failure (HCC)   Status post mitral valve replacement with bioprosthetic valve   Diabetes mellitus ([place])   Essential hypertension   ICD (implantable cardioverter-defibrillator) in place   Stage 3 chronic kidney disease ([place])   Pressure injury of skin   Right critical lower limb ischemia [facility]): Surgery-recommends dressing changes, wound care.  Wound VAC in place.  Plans to change her Vascular today.  LE Dopplers-negative for DVT   New retroperitoneal hematoma/acute blood loss anemia: -CT scan of the abdomen and pelvis showed new retroperitoneal hematoma he was given Vit K & FFP.   AKI from ATN on chronic kidney disease stage III: Creatinine peaked at 5.63; Improving.  -Secondary to  hypotension, retroperitoneal bleed displacing left kidney - Status post HD 8/2.  Creatinine 1.41 - Seen by nephro and urology.  Percutaneous left nephrostomy tube placement and right IJ HD placement 8/1.  Transition to anterograde urethral stent but likely not for another few weeks per IR, Dr Earleen Newport.   Chronic systolic heart failure ([place]), EF 40-45% -Continue midodrine.  Lasix on hold.  Echo 03/04/2021-EF 40-45%, global hypokinesis.  Follow-up outpatient CHF   Permanent atrial fibrillation/Status post mitral valve replacement with bioprosthetic valve: Bioprosthetic valve. -Due to ongoing bleeding Coumadin and heparin on hold. Amiodarone '200mg'$  daily    Diabetes mellitus type 2: Acceptable range.  -Hemoglobin A1c 5.4.  Insulin sliding scale and Accu-Cheks  Hx of Essential HTN but currently borderline low BP -midodrine 10 mg TID  Hyperlipidemia: -Lipitor 40 mg daily   Ethics/goals of care: Seen by Palliative Care    DVT prophylaxis:   No anticoagulation due to ongoing bleeding.  Unable to place SCD due to anatomy and wound care issues Code Status: Full code Family Communication:    Status is: Inpatient  Remains inpatient appropriate because:IV treatments appropriate due to intensity of illness or inability to take PO  Dispo: The patient is from: Home              Anticipated d/c is to: SNF              Patient currently is not medically stable to d/c.  Maintain hospital stay until cleared by vascular and nephrology   Difficult to place patient No       Nutritional status  Nutrition Problem: Increased nutrient needs Etiology: post-op healing  Signs/Symptoms: estimated needs  Interventions: MVI, Juven, Magic cup,  Premier Protein  Body mass index is 26.99 kg/m.  Pressure Injury 04/21/21 Coccyx Medial Stage 2 -  Partial thickness loss of dermis presenting as a shallow open injury with a red, pink wound bed without slough. (Active)  04/21/21 1255  Location: Coccyx   Location Orientation: Medial  Staging: Stage 2 -  Partial thickness loss of dermis presenting as a shallow open injury with a red, pink wound bed without slough.  Wound Description (Comments):   Present on Admission:           Subjective: Feeling ok, uncomfortable on the bed though.    Review of Systems Otherwise negative except as per HPI, including: General = no fevers, chills, dizziness,  fatigue HEENT/EYES = negative for loss of vision, double vision, blurred vision,  sore throa Cardiovascular= negative for chest pain, palpitation Respiratory/lungs= negative for shortness of breath, cough, wheezing; hemoptysis,  Gastrointestinal= negative for nausea, vomiting, abdominal pain Genitourinary= negative for Dysuria MSK = Negative for arthralgia, myalgias Neurology= Negative for headache, numbness, tingling  Psychiatry= Negative for suicidal and homocidal ideation Skin= Negative for Rash    Examination: Constitutional: Not in acute distress Respiratory: Clear to auscultation bilaterally Cardiovascular: Normal sinus rhythm, no rubs Abdomen: Nontender nondistended good bowel sounds Musculoskeletal: No edema noted Skin: LE extremity dress noted at the surgical site.  Neurologic: CN 2-12 grossly intact.  And nonfocal Psychiatric: Normal judgment and insight. Alert and oriented x 3. Normal mood.    Objective: Vitals:   04/23/21 1945 04/23/21 2320 04/24/21 0311 04/24/21 0552  BP: (!) 167/76 (!) 142/73 127/60   Pulse: 83 80 (!) 108   Resp: '18 19 19   '$ Temp: 98.4 F (36.9 C) 98.2 F (36.8 C) 98.2 F (36.8 C)   TempSrc: Oral Oral Oral   SpO2: 97% 91% 93%   Weight:    90.3 kg  Height:        Intake/Output Summary (Last 24 hours) at 04/24/2021 0819 Last data filed at 04/24/2021 0600 Gross per 24 hour  Intake 240 ml  Output 1000 ml  Net -760 ml   Filed Weights   04/22/21 0300 04/23/21 0414 04/24/21 0552  Weight: 86.8 kg 90.3 kg 90.3 kg     Data Reviewed:    CBC: Recent Labs  Lab 04/21/21 0440 04/21/21 1307 04/22/21 0435 04/23/21 0209 04/24/21 0320  WBC 11.4* 10.7* 9.5 11.0* 11.2*  HGB 6.9* 9.5* 9.4* 10.2* 9.6*  HCT 21.5* 29.3* 29.7* 32.3* 29.7*  MCV 80.8 82.3 81.4 82.0 81.6  PLT 230 202 207 217 [id number]   Basic Metabolic Panel: Recent Labs  Lab 04/18/21 2207 04/19/21 0150 04/20/21 0158 04/21/21 0440 04/22/21 0435 04/23/21 0209 04/24/21 0320  NA 135   < > 134* 135 137 135 137  K 6.1*   < > 5.5* 4.5 3.5 3.3* 3.3*  CL 102   < > 101 99 103 101 107  CO2 22   < > '22 22 24 22 23  '$ GLUCOSE 137*   < > 119* 108* 96 108* 111*  BUN 51*   < > 74* 89* 58* 56* 45*  CREATININE 4.64*   < > 5.63* 5.07* 2.83* 2.01* 1.41*  CALCIUM 8.1*   < > 8.5* 8.4* 8.2* 8.3* 8.0*  MG  --   --   --   --   --  2.4 2.1  PHOS 5.9*  --   --   --   --   --   --    < > =  values in this interval not displayed.   GFR: Estimated Creatinine Clearance: 48.2 mL/min (A) (by C-G formula based on SCr of 1.41 mg/dL (H)). Liver Function Tests: No results for input(s): AST, ALT, ALKPHOS, BILITOT, PROT, ALBUMIN in the last 168 hours. No results for input(s): LIPASE, AMYLASE in the last 168 hours. No results for input(s): AMMONIA in the last 168 hours. Coagulation Profile: Recent Labs  Lab 04/20/21 0158 04/21/21 0440 04/22/21 0435 04/23/21 0209 04/24/21 0320  INR 1.3* 1.6* 1.4* 1.4* 1.4*   Cardiac Enzymes: Recent Labs  Lab 04/18/21 2207 04/20/21 0158  CKTOTAL 3,479* 2,716*   BNP (last 3 results) Recent Labs    08/25/20 1121 03/27/21 1123  PROBNP 21,395* 3,146*   HbA1C: No results for input(s): HGBA1C in the last 72 hours. CBG: Recent Labs  Lab 04/23/21 0625 04/23/21 1233 04/23/21 1727 04/23/21 2111 04/24/21 0550  GLUCAP 121* 143* 105* 120* 111*   Lipid Profile: No results for input(s): CHOL, HDL, LDLCALC, TRIG, CHOLHDL, LDLDIRECT in the last 72 hours. Thyroid Function Tests: No results for input(s): TSH, T4TOTAL, FREET4, T3FREE, THYROIDAB in the  last 72 hours. Anemia Panel: No results for input(s): VITAMINB12, FOLATE, FERRITIN, TIBC, IRON, RETICCTPCT in the last 72 hours. Sepsis Labs: Recent Labs  Lab 04/18/21 2207  LATICACIDVEN 1.6    Recent Results (from the past 240 hour(s))  Urine Culture     Status: None   Collection Time: 04/20/21 11:47 AM   Specimen: Urine, Clean Catch  Result Value Ref Range Status   Specimen Description URINE, CLEAN CATCH  Final   Special Requests NONE  Final   Culture   Final    NO GROWTH Performed at [facility], [address], [place], [place] [address]    Report Status 04/21/2021 FINAL  Final         Radiology Studies: VAS [place] LOWER EXTREMITY VENOUS (DVT)  Result Date: 04/22/2021  Lower Venous DVT Study Patient Name:  Ryan [name]  Date of Exam:   04/22/2021 Medical Rec #: [id number]         Accession #:    [id number] Date of Birth: 09-30-1945         Patient Gender: M Patient Age:   75Y Exam Location:  [facility] Procedure:      VAS [place] LOWER EXTREMITY VENOUS (DVT) Referring Phys: [id number] VASUNDHRA RATHORE --------------------------------------------------------------------------------  Indications: Left thigh pain & swelling. Other Indications: Recent LLE angiogram (04/13/21). Comparison Study: No previous exams Performing Technologist: Jody Hill RVT, RDMS  Examination Guidelines: A complete evaluation includes B-mode imaging, spectral Doppler, color Doppler, and power Doppler as needed of all accessible portions of each vessel. Bilateral testing is considered an integral part of a complete examination. Limited examinations for reoccurring indications may be performed as noted. The reflux portion of the exam is performed with the patient in reverse Trendelenburg.  +-----+---------------+---------+-----------+----------+--------------+ RIGHTCompressibilityPhasicitySpontaneityPropertiesThrombus Aging +-----+---------------+---------+-----------+----------+--------------+ CFV   Full           Yes      Yes                                 +-----+---------------+---------+-----------+----------+--------------+   +---------+---------------+---------+-----------+----------+--------------+ LEFT     CompressibilityPhasicitySpontaneityPropertiesThrombus Aging +---------+---------------+---------+-----------+----------+--------------+ CFV      Full           Yes      Yes                                 +---------+---------------+---------+-----------+----------+--------------+  SFJ      Full                                                        +---------+---------------+---------+-----------+----------+--------------+ FV Prox  Full           Yes      Yes                                 +---------+---------------+---------+-----------+----------+--------------+ FV Mid   Full           Yes      Yes                                 +---------+---------------+---------+-----------+----------+--------------+ FV DistalFull           Yes      Yes                                 +---------+---------------+---------+-----------+----------+--------------+ PFV      Full                                                        +---------+---------------+---------+-----------+----------+--------------+ POP      Full           Yes      Yes                                 +---------+---------------+---------+-----------+----------+--------------+ PTV      Full                                                        +---------+---------------+---------+-----------+----------+--------------+ PERO     Full                                                        +---------+---------------+---------+-----------+----------+--------------+     Summary: RIGHT: - No evidence of common femoral vein obstruction.  LEFT: - There is no evidence of deep vein thrombosis in the lower extremity. - There is no evidence of superficial venous thrombosis.  - No  cystic structure found in the popliteal fossa. Subcutaneous edema in area of pain (LLE mid & distal thigh).  *See table(s) above for measurements and observations. Electronically signed by Harold Barban MD on 04/22/2021 at 11:53:43 AM.    Final         Scheduled Meds:  (feeding supplement) PROSource Plus  30 mL Oral BID BM   amiodarone  200 mg Oral Daily   atorvastatin  40 mg Oral q1800   B-complex with vitamin C  1 tablet Oral Daily  Chlorhexidine Gluconate Cloth  6 each Topical Q0600   docusate sodium  100 mg Oral BID   feeding supplement (NEPRO CARB STEADY)  237 mL Oral TID BM   insulin aspart  0-15 Units Subcutaneous TID WC   midodrine  10 mg Oral TID WC   pantoprazole  40 mg Oral Daily   potassium chloride  40 mEq Oral Once   sodium chloride flush  3 mL Intravenous Q12H   Continuous Infusions:  sodium chloride 65 mL/hr at 04/24/21 0810   cefTRIAXone (ROCEPHIN)  IV 1 g (04/24/21 0812)     LOS: 16 days   Time spent= 35 mins    Mirca Yale Arsenio Loader, MD [facility]  If 7PM-7AM, please contact night-coverage  04/24/2021, 8:19 AM

## 2021-04-24 NOTE — Plan of Care (Signed)

## 2021-04-25 ENCOUNTER — Inpatient Hospital Stay (HOSPITAL_COMMUNITY): Payer: Medicare HMO

## 2021-04-25 DIAGNOSIS — I70229 Atherosclerosis of native arteries of extremities with rest pain, unspecified extremity: Secondary | ICD-10-CM | POA: Diagnosis not present

## 2021-04-25 LAB — CBC
HCT: 31.8 % — ABNORMAL LOW (ref 39.0–52.0)
Hemoglobin: 10.3 g/dL — ABNORMAL LOW (ref 13.0–17.0)
MCH: 26.2 pg (ref 26.0–34.0)
MCHC: 32.4 g/dL (ref 30.0–36.0)
MCV: 80.9 fL (ref 80.0–100.0)
Platelets: 277 10*3/uL (ref 150–400)
RBC: 3.93 MIL/uL — ABNORMAL LOW (ref 4.22–5.81)
RDW: 19.4 % — ABNORMAL HIGH (ref 11.5–15.5)
WBC: 12.6 10*3/uL — ABNORMAL HIGH (ref 4.0–10.5)
nRBC: 0 % (ref 0.0–0.2)

## 2021-04-25 LAB — RENAL FUNCTION PANEL
Albumin: 2.2 g/dL — ABNORMAL LOW (ref 3.5–5.0)
Anion gap: 8 (ref 5–15)
BUN: 37 mg/dL — ABNORMAL HIGH (ref 8–23)
CO2: 22 mmol/L (ref 22–32)
Calcium: 8.3 mg/dL — ABNORMAL LOW (ref 8.9–10.3)
Chloride: 108 mmol/L (ref 98–111)
Creatinine, Ser: 1.24 mg/dL (ref 0.61–1.24)
GFR, Estimated: 60 mL/min — ABNORMAL LOW (ref >60)
Glucose, Bld: 131 mg/dL — ABNORMAL HIGH (ref 70–99)
Phosphorus: 1.8 mg/dL — ABNORMAL LOW (ref 2.5–4.6)
Potassium: 3.6 mmol/L (ref 3.5–5.1)
Sodium: 138 mmol/L (ref 135–145)

## 2021-04-25 LAB — GLUCOSE, CAPILLARY
Glucose-Capillary: 93 mg/dL (ref 70–99)
Glucose-Capillary: 125 mg/dL — ABNORMAL HIGH (ref 70–99)
Glucose-Capillary: 120 mg/dL — ABNORMAL HIGH (ref 70–99)
Glucose-Capillary: 119 mg/dL — ABNORMAL HIGH (ref 70–99)

## 2021-04-25 LAB — MAGNESIUM: Magnesium: 2.1 mg/dL (ref 1.7–2.4)

## 2021-04-25 LAB — PROTIME-INR
INR: 1.3 — ABNORMAL HIGH (ref 0.8–1.2)
Prothrombin Time: 16.4 seconds — ABNORMAL HIGH (ref 11.4–15.2)

## 2021-04-25 LAB — BRAIN NATRIURETIC PEPTIDE: B Natriuretic Peptide: 1691.6 pg/mL — ABNORMAL HIGH (ref 0.0–100.0)

## 2021-04-25 MED ORDER — POTASSIUM CHLORIDE CRYS ER 20 MEQ PO TBCR
40.0000 meq | EXTENDED_RELEASE_TABLET | Freq: Once | ORAL | Status: AC
  Administered 2021-04-25: 40 meq via ORAL
  Filled 2021-04-25: qty 2

## 2021-04-25 MED ORDER — FUROSEMIDE 20 MG PO TABS
20.0000 mg | ORAL_TABLET | Freq: Every day | ORAL | Status: DC
  Administered 2021-04-25: 20 mg via ORAL
  Filled 2021-04-25 (×2): qty 1

## 2021-04-25 MED ORDER — IPRATROPIUM-ALBUTEROL 0.5-2.5 (3) MG/3ML IN SOLN
3.0000 mL | Freq: Four times a day (QID) | RESPIRATORY_TRACT | Status: DC
  Administered 2021-04-25 (×3): 3 mL via RESPIRATORY_TRACT
  Filled 2021-04-25 (×2): qty 3

## 2021-04-25 NOTE — Progress Notes (Signed)
Mobility Specialist: Progress Note   04/25/21 1825  Mobility  Activity Dangled on edge of bed  Level of Assistance Minimal assist, patient does 75% or more  Assistive Device None  Mobility Sit up in bed/chair position for meals  Mobility Response Tolerated fair  Mobility performed by Mobility specialist  $Mobility charge 1 Mobility   Pre-Mobility: 84 HR Post-Mobility: 89 HR, 98% SpO2  Pt required minA to sit EOB from supine. Attempted to assist pt to stand but unable to, recommend +2 for safety. Pt back in bed after attempt to stand to be assisted with pericare. Pt set-up with his dinner and has call bell at his side.   [facility] Jaisa Defino Mobility Specialist Mobility Specialist Phone: [phone]

## 2021-04-25 NOTE — Progress Notes (Signed)
  Progress Note    04/25/2021 8:18 AM 12 Days Post-Op  Subjective:  R sided abd pain has completely resolved per patient   Vitals:   04/24/21 2314 04/25/21 0355  BP: 129/62 139/65  Pulse: 79 82  Resp:    Temp: 98.6 F (37 C) 98 F (36.7 C)  SpO2: 92% 94%   Physical Exam: Lungs:  non labored Incisions:  right leg incisions unremarkable; dressings left in place R foot Abdomen:  soft, non tender Neurologic: a&O  CBC    Component Value Date/Time   WBC 12.6 (H) 04/25/2021 0137   RBC 3.93 (L) 04/25/2021 0137   HGB 10.3 (L) 04/25/2021 0137   HGB 9.6 (L) 03/27/2021 1123   HCT 31.8 (L) 04/25/2021 0137   HCT 30.4 (L) 03/27/2021 1123   PLT 277 04/25/2021 0137   PLT 362 03/27/2021 1123   MCV 80.9 04/25/2021 0137   MCV 77 (L) 03/27/2021 1123   MCH 26.2 04/25/2021 0137   MCHC 32.4 04/25/2021 0137   RDW 19.4 (H) 04/25/2021 0137   RDW 17.2 (H) 03/27/2021 1123   LYMPHSABS 1,363 06/01/2016 1414   MONOABS 517 06/01/2016 1414   EOSABS 141 06/01/2016 1414   BASOSABS 47 06/01/2016 1414    BMET    Component Value Date/Time   NA 138 04/25/2021 0137   NA 142 03/27/2021 1123   K 3.6 04/25/2021 0137   CL 108 04/25/2021 0137   CO2 22 04/25/2021 0137   GLUCOSE 131 (H) 04/25/2021 0137   BUN 37 (H) 04/25/2021 0137   BUN 17 03/27/2021 1123   CREATININE 1.24 04/25/2021 0137   CREATININE 1.17 06/01/2016 1414   CALCIUM 8.3 (L) 04/25/2021 0137   GFRNONAA 60 (L) 04/25/2021 0137   GFRAA 39 (L) 10/01/2020 1157    INR    Component Value Date/Time   INR 1.3 (H) 04/25/2021 0137     Intake/Output Summary (Last 24 hours) at 04/25/2021 0818 Last data filed at 04/25/2021 [id number] Gross per 24 hour  Intake 240 ml  Output 1150 ml  Net -910 ml     Assessment/Plan:  75 y.o. male is s/p R popliteal to peroneal bypass with subsequent R heel debridement 12 Days Post-Op   CT negative for new or changing RP hematoma Continue dressing changes R foot Vascular team to evaluate R heel on  Monday   Dagoberto Ligas, PA-C [facility] [phone] 04/25/2021 8:18 AM

## 2021-04-25 NOTE — Progress Notes (Signed)
PROGRESS NOTE    Ryan [name]  [id number] DOB: 10-06-1945 DOA: 04/08/2021 PCP: [name], MD   Brief Narrative:  75 y.o. 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  Scheduled to have debridement on 04/08/2021 but due to an INR of 3.2 surgery was postponed.  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  He has completed his course of antibiotics in house last day was 04/13/2019.  He developed a retroperitoneal hematoma with left kidney displacement and hypotension developed ATN and hyperkalemia, nephrology was consulted recommended renal replacement therapy, urology was consulted due to his left hydronephrosis would like recommended a left nephrostomy tube.  Nephrostomy tube placed on 8/1.   Assessment & Plan:   Principal Problem:   Critical lower limb ischemia (HCC) Active Problems:   Chronic systolic heart failure (HCC)   Status post mitral valve replacement with bioprosthetic valve   Diabetes mellitus (Ryan [name])   Essential hypertension   ICD (implantable cardioverter-defibrillator) in place   Stage 3 chronic kidney disease ([place])   Pressure injury of skin   Right critical lower limb ischemia [facility]): Surgery-recommends dressing changes, wound care.  Wound VAC in place.  Vascular following.  LE Dopplers-negative for DVT   New retroperitoneal hematoma/acute [name] loss anemia: -CT scan of the abdomen and pelvis showed new retroperitoneal hematoma he was given Vit K & FFP. Repeat CT A/P on 8/5- remains large unchanged except increase in Pleural effusion vs atelactesis.   B/L Opacity -Suspect  pleural effusion versus atelectasis.  Incentive spirometer, flutter valve, out of bed to chair - Chest x-ray, BNP. Resume lasix '20mg'$  po daily   AKI from ATN on chronic kidney disease stage III: Creatinine peaked at 5.63; Improving.  -Secondary to hypotension, retroperitoneal bleed displacing left kidney - Status post HD 8/2.  Creatinine 1.41 - Seen by nephro and urology.  Percutaneous left nephrostomy tube placement and right IJ HD placement 8/1.  Transition to anterograde urethral stent but likely not for another few weeks per IR, Dr Earleen Newport.   Chronic systolic heart failure ([place]), EF 40-45% -On Midodrine '10mg'$  po tid.   Echo 03/04/2021-EF 40-45%, global hypokinesis.  Follow-up outpatient CHF. Resume lasix '20mg'$  po daily    Permanent atrial fibrillation/Status post mitral valve replacement with bioprosthetic valve: Bioprosthetic valve. -Due to ongoing bleeding Coumadin and heparin on hold. Amiodarone '200mg'$  daily    Diabetes mellitus type 2: Acceptable range.  -Hemoglobin A1c 5.4.  Insulin sliding scale and Accu-Cheks  Hx of Essential HTN but currently borderline low BP -midodrine 10 mg TID  Hyperlipidemia: -Lipitor 40 mg daily   Ethics/goals of care: Seen by Palliative Care    DVT prophylaxis:   No anticoagulation due to ongoing bleeding.  Unable to place SCD due to anatomy and wound care issues Code Status: Full code Family Communication:  Son Ryan [name] Updated.   Status is: Inpatient  Remains inpatient appropriate because:IV treatments appropriate due to intensity of illness or inability to take PO  Dispo: The patient is from: Home              Anticipated d/c is to: SNF              Patient currently is not  medically stable to d/c.  Maintain hospital stay until cleared by vascular and nephrology   Difficult to place patient No       Nutritional status  Nutrition Problem: Increased nutrient needs Etiology: post-op healing  Signs/Symptoms: estimated  needs  Interventions: MVI, Juven, Magic cup, Premier Protein  Body mass index is 26.58 kg/m.  Pressure Injury 04/21/21 Coccyx Medial Stage 2 -  Partial thickness loss of dermis presenting as a shallow open injury with a red, pink wound bed without slough. (Active)  04/21/21 1255  Location: Coccyx  Location Orientation: Medial  Staging: Stage 2 -  Partial thickness loss of dermis presenting as a shallow open injury with a red, pink wound bed without slough.  Wound Description (Comments):   Present on Admission:           Subjective: During my visit he thought he had a dentist appointment but no other complaints.  No acute events overnight.   Examination: Constitutional: Not in acute distress Respiratory: Clear to auscultation bilaterally Cardiovascular: Normal sinus rhythm, no rubs Abdomen: Nontender nondistended good bowel sounds Musculoskeletal: No edema noted Skin: No rashes seen Neurologic: CN 2-12 grossly intact.  And nonfocal Psychiatric: Alert to name only.  Poor judgment and insight Nephrostomy tube noted External Foley catheter  Objective: Vitals:   04/24/21 1917 04/24/21 2120 04/24/21 2314 04/25/21 0355  BP: 129/81 (!) 139/57 129/62 139/65  Pulse:  80 79 82  Resp: 16     Temp: 98.3 F (36.8 C) 98.7 F (37.1 C) 98.6 F (37 C) 98 F (36.7 C)  TempSrc: Oral Oral Oral Oral  SpO2: 97%  92% 94%  Weight:    88.9 kg  Height:        Intake/Output Summary (Last 24 hours) at 04/25/2021 0823 Last data filed at 04/25/2021 0653 Gross per 24 hour  Intake 240 ml  Output 1150 ml  Net -910 ml   Filed Weights   04/23/21 0414 04/24/21 0552 04/25/21 0355  Weight: 90.3 kg 90.3 kg 88.9 kg     Data Reviewed:   CBC: Recent Labs  Lab 04/21/21 1307 04/22/21 0435 04/23/21 0209 04/24/21 0320 04/25/21 0137  WBC 10.7* 9.5 11.0* 11.2* 12.6*  HGB 9.5* 9.4* 10.2* 9.6* 10.3*  HCT 29.3* 29.7* 32.3* 29.7* 31.8*  MCV 82.3 81.4 82.0 81.6 80.9  PLT 202 207 217 224 [id number]    Basic Metabolic Panel: Recent Labs  Lab 04/18/21 2207 04/19/21 0150 04/21/21 0440 04/22/21 0435 04/23/21 0209 04/24/21 0320 04/25/21 0137  NA 135   < > 135 137 135 137 138  K 6.1*   < > 4.5 3.5 3.3* 3.3* 3.6  CL 102   < > 99 103 101 107 108  CO2 22   < > '22 24 22 23 22  '$ GLUCOSE 137*   < > 108* 96 108* 111* 131*  BUN 51*   < > 89* 58* 56* 45* 37*  CREATININE 4.64*   < > 5.07* 2.83* 2.01* 1.41* 1.24  CALCIUM 8.1*   < > 8.4* 8.2* 8.3* 8.0* 8.3*  MG  --   --   --   --  2.4 2.1 2.1  PHOS 5.9*  --   --   --   --   --  1.8*   < > = values in this interval not displayed.   GFR: Estimated Creatinine Clearance: 54.8 mL/min (by C-G formula based on SCr of 1.24 mg/dL). Liver Function Tests: Recent Labs  Lab 04/25/21 0137  ALBUMIN  2.2*   No results for input(s): LIPASE, AMYLASE in the last 168 hours. No results for input(s): AMMONIA in the last 168 hours. Coagulation Profile: Recent Labs  Lab 04/21/21 0440 04/22/21 0435 04/23/21 0209 04/24/21 0320 04/25/21 0137  INR 1.6* 1.4* 1.4* 1.4* 1.3*   Cardiac Enzymes: Recent Labs  Lab 04/18/21 2207 04/20/21 0158  CKTOTAL 3,479* 2,716*   BNP (last 3 results) Recent Labs    08/25/20 1121 03/27/21 1123  PROBNP 21,395* 3,146*   HbA1C: No results for input(s): HGBA1C in the last 72 hours. CBG: Recent Labs  Lab 04/24/21 0550 04/24/21 1132 04/24/21 1611 04/24/21 2129 04/25/21 0618  GLUCAP 111* 117* 128* 118* 93   Lipid Profile: No results for input(s): CHOL, HDL, LDLCALC, TRIG, CHOLHDL, LDLDIRECT in the last 72 hours. Thyroid Function Tests: No results for input(s): TSH, T4TOTAL, FREET4, T3FREE, THYROIDAB in the last 72 hours. Anemia Panel: No results for input(s): VITAMINB12, FOLATE, FERRITIN, TIBC, IRON, RETICCTPCT in the last 72 hours. Sepsis Labs: Recent Labs  Lab 04/18/21 2207  LATICACIDVEN 1.6    Recent Results (from the past 240 hour(s))  Urine Culture     Status: None   Collection Time: 04/20/21 11:47  AM   Specimen: Urine, Clean Catch  Result Value Ref Range Status   Specimen Description URINE, CLEAN CATCH  Final   Special Requests NONE  Final   Culture   Final    NO GROWTH Performed at [facility], [address], [place], [place] [address]    Report Status 04/21/2021 FINAL  Final         Radiology Studies: CT ABDOMEN PELVIS WO CONTRAST  Result Date: 04/24/2021 CLINICAL DATA:  Abdominal distension, left retroperitoneal hematoma, status post left percutaneous nephrostomy EXAM: CT ABDOMEN AND PELVIS WITHOUT CONTRAST TECHNIQUE: Multidetector CT imaging of the abdomen and pelvis was performed following the standard protocol without IV contrast. COMPARISON:  04/19/2021 FINDINGS: Lower chest: Small to moderate bilateral pleural effusions and associated atelectasis or consolidation, increased compared to prior examination. Mitral valve prosthesis. Cardiomegaly. Hepatobiliary: No solid liver abnormality is seen. Sludge and gallstones in the dependent gallbladder. No gallbladder wall thickening, or biliary dilatation. Pancreas: Unremarkable. No pancreatic ductal dilatation or surrounding inflammatory changes. Spleen: Normal in size without significant abnormality. Adrenals/Urinary Tract: Adrenal glands are unremarkable. Status post interval left percutaneous nephrostomy with formed pigtail in the left renal pelvis and relief of previously seen hydronephrosis. No right-sided hydronephrosis. Bladder is unremarkable. Stomach/Bowel: Stomach is within normal limits. Appendix appears normal. No evidence of bowel wall thickening, distention, or inflammatory changes. Vascular/Lymphatic: Aortic atherosclerosis. No enlarged abdominal or pelvic lymph nodes. Reproductive: No mass or other significant abnormality. Other: Anasarca, worsened compared to prior examination. No abdominopelvic ascites. No significant interval change in a large, heterogeneously layering hematoma centered about the left aspect of the  retroperitoneum and mesocolon, measuring 16.7 x 9.2 cm in greatest axial extent (series 3, image 56). Additional sizable hematoma component in the left iliacus muscle measuring approximately 5.2 x 2.5 cm, likewise not significantly changed (series 3, image 70). Musculoskeletal: No acute or significant osseous findings. IMPRESSION: 1. No significant interval change in a large, heterogeneously layering hematoma centered about the left aspect of the retroperitoneum and mesocolon, measuring 16.7 x 9.2 cm in greatest axial extent. 2. Additional sizable hematoma component in the left iliacus muscle measuring approximately 5.2 x 2.5 cm, likewise not significantly changed. 3. Status post interval left percutaneous nephrostomy with formed pigtail in the left renal pelvis and relief  of previously seen hydronephrosis. No right-sided hydronephrosis. 4. Small to moderate bilateral pleural effusions and associated atelectasis or consolidation, increased compared to prior examination. 5. Anasarca, worsened compared to prior examination. 6. Cholelithiasis. Aortic Atherosclerosis (ICD10-I70.0). Electronically Signed   By: Eddie Candle M.D.   On: 04/24/2021 15:28        Scheduled Meds:  (feeding supplement) PROSource Plus  30 mL Oral BID BM   amiodarone  200 mg Oral Daily   atorvastatin  40 mg Oral q1800   B-complex with vitamin C  1 tablet Oral Daily   docusate sodium  100 mg Oral BID   feeding supplement (NEPRO CARB STEADY)  237 mL Oral TID BM   insulin aspart  0-15 Units Subcutaneous TID WC   midodrine  10 mg Oral TID WC   pantoprazole  40 mg Oral Daily   sodium chloride flush  3 mL Intravenous Q12H   Continuous Infusions:  cefTRIAXone (ROCEPHIN)  IV 1 g (04/24/21 0812)     LOS: 17 days   Time spent= 35 mins    Ryan Kana Arsenio Loader, MD [facility]  If 7PM-7AM, please contact night-coverage  04/25/2021, 8:23 AM

## 2021-04-26 ENCOUNTER — Inpatient Hospital Stay (HOSPITAL_COMMUNITY): Payer: Medicare HMO

## 2021-04-26 DIAGNOSIS — I70229 Atherosclerosis of native arteries of extremities with rest pain, unspecified extremity: Secondary | ICD-10-CM | POA: Diagnosis not present

## 2021-04-26 LAB — CBC
HCT: 33.7 % — ABNORMAL LOW (ref 39.0–52.0)
Hemoglobin: 10.7 g/dL — ABNORMAL LOW (ref 13.0–17.0)
MCH: 25.9 pg — ABNORMAL LOW (ref 26.0–34.0)
MCHC: 31.8 g/dL (ref 30.0–36.0)
MCV: 81.6 fL (ref 80.0–100.0)
Platelets: 302 10*3/uL (ref 150–400)
RBC: 4.13 MIL/uL — ABNORMAL LOW (ref 4.22–5.81)
RDW: 19.9 % — ABNORMAL HIGH (ref 11.5–15.5)
WBC: 14 10*3/uL — ABNORMAL HIGH (ref 4.0–10.5)
nRBC: 0 % (ref 0.0–0.2)

## 2021-04-26 LAB — BASIC METABOLIC PANEL
Anion gap: 9 (ref 5–15)
BUN: 32 mg/dL — ABNORMAL HIGH (ref 8–23)
CO2: 21 mmol/L — ABNORMAL LOW (ref 22–32)
Calcium: 8.6 mg/dL — ABNORMAL LOW (ref 8.9–10.3)
Chloride: 111 mmol/L (ref 98–111)
Creatinine, Ser: 1.39 mg/dL — ABNORMAL HIGH (ref 0.61–1.24)
GFR, Estimated: 52 mL/min — ABNORMAL LOW (ref >60)
Glucose, Bld: 143 mg/dL — ABNORMAL HIGH (ref 70–99)
Potassium: 3.9 mmol/L (ref 3.5–5.1)
Sodium: 141 mmol/L (ref 135–145)

## 2021-04-26 LAB — GLUCOSE, CAPILLARY
Glucose-Capillary: 125 mg/dL — ABNORMAL HIGH (ref 70–99)
Glucose-Capillary: 106 mg/dL — ABNORMAL HIGH (ref 70–99)
Glucose-Capillary: 122 mg/dL — ABNORMAL HIGH (ref 70–99)
Glucose-Capillary: 145 mg/dL — ABNORMAL HIGH (ref 70–99)

## 2021-04-26 LAB — MAGNESIUM: Magnesium: 1.9 mg/dL (ref 1.7–2.4)

## 2021-04-26 LAB — PROTIME-INR
INR: 1.4 — ABNORMAL HIGH (ref 0.8–1.2)
Prothrombin Time: 17 seconds — ABNORMAL HIGH (ref 11.4–15.2)

## 2021-04-26 MED ORDER — FUROSEMIDE 20 MG PO TABS
20.0000 mg | ORAL_TABLET | Freq: Every day | ORAL | Status: DC
  Administered 2021-04-26 – 2021-05-03 (×8): 20 mg via ORAL
  Filled 2021-04-26 (×8): qty 1

## 2021-04-26 MED ORDER — IPRATROPIUM-ALBUTEROL 0.5-2.5 (3) MG/3ML IN SOLN
3.0000 mL | Freq: Two times a day (BID) | RESPIRATORY_TRACT | Status: DC
  Administered 2021-04-26 – 2021-04-28 (×5): 3 mL via RESPIRATORY_TRACT
  Filled 2021-04-26 (×6): qty 3

## 2021-04-26 MED ORDER — SODIUM CHLORIDE 0.9 % IV SOLN: INTRAVENOUS | Status: DC

## 2021-04-26 NOTE — Plan of Care (Signed)

## 2021-04-26 NOTE — Progress Notes (Signed)
Mobility Specialist: Progress Note   04/26/21 1229  Mobility  Activity Stood at bedside  Level of Assistance Maximum assist, patient does 25-49%  Assistive Device Stedy  Mobility Sit up in bed/chair position for meals  Mobility Response Tolerated fair  Mobility performed by Mobility specialist  $Mobility charge 1 Mobility   Pre-Mobility: 94 HR, 97% SpO2 Post-Mobility: 97 HR, 137/74 BP, 90% SpO2  Pt required minA to sit EOB from supine and maxA to stand using the [place]. Pt c/o pain in R foot, no rating given. Pt was able to stand x2 for roughly 10 seconds each bout. Pt back to bed and was assisted with pericare by NT and myself. Bed alarm is on and call bell is at his side.   [facility] Cole [place]ridge Mobility Specialist Mobility Specialist Phone: [phone]

## 2021-04-26 NOTE — Progress Notes (Signed)
PROGRESS NOTE    Ryan [name]  [id number] DOB: 12-Nov-1945 DOA: 04/08/2021 PCP: [name], MD   Brief Narrative:  75 y.o. 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  Scheduled to have debridement on 04/08/2021 but due to an INR of 3.2 surgery was postponed.  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  He has completed his course of antibiotics in house last day was 04/13/2019.  He developed a retroperitoneal hematoma with left kidney displacement and hypotension developed ATN and hyperkalemia, nephrology was consulted recommended renal replacement therapy, urology was consulted due to his left hydronephrosis would like recommended a left nephrostomy tube.  Nephrostomy tube placed on 8/1.   Assessment & Plan:   Principal Problem:   Critical lower limb ischemia (HCC) Active Problems:   Chronic systolic heart failure (HCC)   Status post mitral valve replacement with bioprosthetic valve   Diabetes mellitus ([place])   Essential hypertension   ICD (implantable cardioverter-defibrillator) in place   Stage 3 chronic kidney disease ([place])   Pressure injury of skin   Right critical lower limb ischemia [facility]): Vascular Surgery-recommends dressing changes, wound care.  Wound VAC in place.    LE Dopplers-negative for DVT   New retroperitoneal hematoma/acute blood loss anemia: -CT scan of the abdomen and pelvis showed new retroperitoneal hematoma he was given Vit K & FFP. Repeat CT A/P on 8/5- remains large unchanged except increase in Pleural effusion vs atelactesis.   B/L Opacity -Suspect pleural  effusion versus atelectasis.  Incentive spirometer, flutter valve, out of bed to chair - CXR showed opacity, Elevated BNP. Lasix '20mg'$  po daily.    AKI from ATN on chronic kidney disease stage III: Creatinine peaked at 5.63; Improving.  -Secondary to hypotension, retroperitoneal bleed displacing left kidney - Status post HD 8/2.  Creatinine 1.41 - Seen by nephro and urology.  Percutaneous left nephrostomy tube placement and right IJ HD placement 8/1.  Transition to anterograde urethral stent but likely not for another few weeks per IR, Dr Earleen Newport.   Chronic systolic heart failure ([place]), EF 40-45% -On Midodrine '10mg'$  po tid.   Echo 03/04/2021-EF 40-45%, global hypokinesis.  Follow-up outpatient CHF. Resume lasix '20mg'$  po daily    Permanent atrial fibrillation/Status post mitral valve replacement with bioprosthetic valve: Bioprosthetic valve. Anticoagulation on hold.  Amiodarone '200mg'$  daily    Diabetes mellitus type 2: Acceptable range.  -Hemoglobin A1c 5.4.  Insulin sliding scale and Accu-Cheks  Hx of Essential HTN but currently borderline low BP -midodrine 10 mg TID  Hyperlipidemia: -Lipitor 40 mg daily   Ethics/goals of care: Seen by Palliative Care    DVT prophylaxis:   No anticoagulation due to ongoing bleeding.  Unable to place SCD due to anatomy and wound care issues Code Status: Full code Family Communication:  Son [name] Updated periodically  Status is: Inpatient  Remains inpatient appropriate because:IV treatments appropriate due to intensity of illness or inability to take PO  Dispo: The patient is from: Home              Anticipated d/c is to: SNF              Patient currently is not medically stable to  d/c.  Maintain hospital stay until cleared by vascular and nephrology   Difficult to place patient No       Nutritional status  Nutrition Problem: Increased nutrient needs Etiology: post-op healing  Signs/Symptoms: estimated needs  Interventions: MVI,  Juven, Magic cup, Premier Protein  Body mass index is 26.58 kg/m.  Pressure Injury 04/21/21 Coccyx Medial Stage 2 -  Partial thickness loss of dermis presenting as a shallow open injury with a red, pink wound bed without slough. (Active)  04/21/21 1255  Location: Coccyx  Location Orientation: Medial  Staging: Stage 2 -  Partial thickness loss of dermis presenting as a shallow open injury with a red, pink wound bed without slough.  Wound Description (Comments):   Present on Admission:           Subjective: Feels ok, no abd pain today. No acute events overnight.   Examination: Constitutional: Not in acute distress Respiratory: Clear to auscultation bilaterally Cardiovascular: Normal sinus rhythm, no rubs Abdomen: Nontender nondistended good bowel sounds Musculoskeletal: No edema noted Skin: RLE dressing noted Neurologic: CN 2-12 grossly intact.  And nonfocal Psychiatric: Normal judgment and insight. Alert and oriented x name and place today  Nephrostomy tube noted External Foley catheter  Objective: Vitals:   04/25/21 2357 04/26/21 0407 04/26/21 0803 04/26/21 0809  BP: 100/84 (!) 139/42 (!) 152/75   Pulse: 90 86 86   Resp: '18 18 16   '$ Temp: 98.6 F (37 C) 98.8 F (37.1 C) 98.6 F (37 C)   TempSrc: Oral Oral Oral   SpO2: 94% 95% 95% 94%  Weight:      Height:        Intake/Output Summary (Last 24 hours) at 04/26/2021 1056 Last data filed at 04/26/2021 0615 Gross per 24 hour  Intake 120 ml  Output 800 ml  Net -680 ml   Filed Weights   04/23/21 0414 04/24/21 0552 04/25/21 0355  Weight: 90.3 kg 90.3 kg 88.9 kg     Data Reviewed:   CBC: Recent Labs  Lab 04/22/21 0435 04/23/21 0209 04/24/21 0320 04/25/21 0137 04/26/21 0125  WBC 9.5 11.0* 11.2* 12.6* 14.0*  HGB 9.4* 10.2* 9.6* 10.3* 10.7*  HCT 29.7* 32.3* 29.7* 31.8* 33.7*  MCV 81.4 82.0 81.6 80.9 81.6  PLT 207 217 224 277 [id number]   Basic Metabolic Panel: Recent Labs  Lab 04/22/21 0435 04/23/21 0209  04/24/21 0320 04/25/21 0137 04/26/21 0125  NA 137 135 137 138 141  K 3.5 3.3* 3.3* 3.6 3.9  CL 103 101 107 108 111  CO2 '24 22 23 22 '$ 21*  GLUCOSE 96 108* 111* 131* 143*  BUN 58* 56* 45* 37* 32*  CREATININE 2.83* 2.01* 1.41* 1.24 1.39*  CALCIUM 8.2* 8.3* 8.0* 8.3* 8.6*  MG  --  2.4 2.1 2.1 1.9  PHOS  --   --   --  1.8*  --    GFR: Estimated Creatinine Clearance: 48.8 mL/min (A) (by C-G formula based on SCr of 1.39 mg/dL (H)). Liver Function Tests: Recent Labs  Lab 04/25/21 0137  ALBUMIN 2.2*   No results for input(s): LIPASE, AMYLASE in the last 168 hours. No results for input(s): AMMONIA in the last 168 hours. Coagulation Profile: Recent Labs  Lab 04/22/21 0435 04/23/21 0209 04/24/21 0320 04/25/21 0137 04/26/21 0125  INR 1.4* 1.4* 1.4* 1.3* 1.4*   Cardiac Enzymes: Recent Labs  Lab 04/20/21 0158  CKTOTAL 2,716*   BNP (last 3 results) Recent Labs    08/25/20 1121 03/27/21 1123  PROBNP 21,395* 3,146*   HbA1C: No results for input(s): HGBA1C in the last 72 hours. CBG: Recent Labs  Lab 04/25/21 1113 04/25/21 1615 04/25/21 2134 04/26/21 0617 04/26/21 0814  GLUCAP 125* 120* 119* 125* 106*   Lipid Profile: No results for input(s): CHOL, HDL, LDLCALC, TRIG, CHOLHDL, LDLDIRECT in the last 72 hours. Thyroid Function Tests: No results for input(s): TSH, T4TOTAL, FREET4, T3FREE, THYROIDAB in the last 72 hours. Anemia Panel: No results for input(s): VITAMINB12, FOLATE, FERRITIN, TIBC, IRON, RETICCTPCT in the last 72 hours. Sepsis Labs: No results for input(s): PROCALCITON, LATICACIDVEN in the last 168 hours.   Recent Results (from the past 240 hour(s))  Urine Culture     Status: None   Collection Time: 04/20/21 11:47 AM   Specimen: Urine, Clean Catch  Result Value Ref Range Status   Specimen Description URINE, CLEAN CATCH  Final   Special Requests NONE  Final   Culture   Final    NO GROWTH Performed at [facility], [address],  [place], [place] [address]    Report Status 04/21/2021 FINAL  Final         Radiology Studies: CT ABDOMEN PELVIS WO CONTRAST  Result Date: 04/24/2021 CLINICAL DATA:  Abdominal distension, left retroperitoneal hematoma, status post left percutaneous nephrostomy EXAM: CT ABDOMEN AND PELVIS WITHOUT CONTRAST TECHNIQUE: Multidetector CT imaging of the abdomen and pelvis was performed following the standard protocol without IV contrast. COMPARISON:  04/19/2021 FINDINGS: Lower chest: Small to moderate bilateral pleural effusions and associated atelectasis or consolidation, increased compared to prior examination. Mitral valve prosthesis. Cardiomegaly. Hepatobiliary: No solid liver abnormality is seen. Sludge and gallstones in the dependent gallbladder. No gallbladder wall thickening, or biliary dilatation. Pancreas: Unremarkable. No pancreatic ductal dilatation or surrounding inflammatory changes. Spleen: Normal in size without significant abnormality. Adrenals/Urinary Tract: Adrenal glands are unremarkable. Status post interval left percutaneous nephrostomy with formed pigtail in the left renal pelvis and relief of previously seen hydronephrosis. No right-sided hydronephrosis. Bladder is unremarkable. Stomach/Bowel: Stomach is within normal limits. Appendix appears normal. No evidence of bowel wall thickening, distention, or inflammatory changes. Vascular/Lymphatic: Aortic atherosclerosis. No enlarged abdominal or pelvic lymph nodes. Reproductive: No mass or other significant abnormality. Other: Anasarca, worsened compared to prior examination. No abdominopelvic ascites. No significant interval change in a large, heterogeneously layering hematoma centered about the left aspect of the retroperitoneum and mesocolon, measuring 16.7 x 9.2 cm in greatest axial extent (series 3, image 56). Additional sizable hematoma component in the left iliacus muscle measuring approximately 5.2 x 2.5 cm, likewise not significantly  changed (series 3, image 70). Musculoskeletal: No acute or significant osseous findings. IMPRESSION: 1. No significant interval change in a large, heterogeneously layering hematoma centered about the left aspect of the retroperitoneum and mesocolon, measuring 16.7 x 9.2 cm in greatest axial extent. 2. Additional sizable hematoma component in the left iliacus muscle measuring approximately 5.2 x 2.5 cm, likewise not significantly changed. 3. Status post interval left percutaneous nephrostomy with formed pigtail in the left renal pelvis and relief of previously seen hydronephrosis. No right-sided hydronephrosis. 4. Small to moderate bilateral pleural effusions and associated atelectasis or consolidation, increased compared to prior examination. 5. Anasarca, worsened compared to prior examination. 6. Cholelithiasis. Aortic Atherosclerosis (ICD10-I70.0). Electronically Signed   By: Eddie Candle M.D.   On: 04/24/2021 15:28   DG Chest Port 1 View  Result Date: 04/25/2021 CLINICAL DATA:  Dyspnea EXAM: PORTABLE CHEST 1 VIEW COMPARISON:  Chest radiograph dated 04/19/2021. FINDINGS: The  heart remains enlarged. Vascular calcifications are seen in the aortic arch. There is a small left pleural effusion which appears increased. Mild bibasilar atelectasis/airspace disease, left greater than right, appear increased. No pneumothorax. A left subclavian approach cardiac device, median sternotomy wires, and a mitral valve replacement are redemonstrated. IMPRESSION: Increased small left pleural effusion and bibasilar atelectasis/airspace disease. Aortic Atherosclerosis (ICD10-I70.0). Electronically Signed   By: Zerita Boers M.D.   On: 04/25/2021 10:27        Scheduled Meds:  (feeding supplement) PROSource Plus  30 mL Oral BID BM   amiodarone  200 mg Oral Daily   atorvastatin  40 mg Oral q1800   B-complex with vitamin C  1 tablet Oral Daily   docusate sodium  100 mg Oral BID   feeding supplement (NEPRO CARB STEADY)  237  mL Oral TID BM   insulin aspart  0-15 Units Subcutaneous TID WC   ipratropium-albuterol  3 mL Nebulization BID   midodrine  10 mg Oral TID WC   pantoprazole  40 mg Oral Daily   sodium chloride flush  3 mL Intravenous Q12H   Continuous Infusions:  sodium chloride 75 mL/hr at 04/26/21 0830   cefTRIAXone (ROCEPHIN)  IV 1 g (04/26/21 0831)     LOS: 18 days   Time spent= 35 mins    Verba Ainley Arsenio Loader, MD [facility]  If 7PM-7AM, please contact night-coverage  04/26/2021, 10:56 AM

## 2021-04-27 DIAGNOSIS — N183 Chronic kidney disease, stage 3 unspecified: Secondary | ICD-10-CM

## 2021-04-27 DIAGNOSIS — Z7189 Other specified counseling: Secondary | ICD-10-CM | POA: Diagnosis not present

## 2021-04-27 DIAGNOSIS — R531 Weakness: Secondary | ICD-10-CM | POA: Diagnosis not present

## 2021-04-27 DIAGNOSIS — I5022 Chronic systolic (congestive) heart failure: Secondary | ICD-10-CM | POA: Diagnosis not present

## 2021-04-27 DIAGNOSIS — I70229 Atherosclerosis of native arteries of extremities with rest pain, unspecified extremity: Secondary | ICD-10-CM | POA: Diagnosis not present

## 2021-04-27 LAB — BASIC METABOLIC PANEL
Anion gap: 11 (ref 5–15)
BUN: 30 mg/dL — ABNORMAL HIGH (ref 8–23)
CO2: 19 mmol/L — ABNORMAL LOW (ref 22–32)
Calcium: 8.4 mg/dL — ABNORMAL LOW (ref 8.9–10.3)
Chloride: 113 mmol/L — ABNORMAL HIGH (ref 98–111)
Creatinine, Ser: 1.4 mg/dL — ABNORMAL HIGH (ref 0.61–1.24)
GFR, Estimated: 52 mL/min — ABNORMAL LOW (ref >60)
Glucose, Bld: 113 mg/dL — ABNORMAL HIGH (ref 70–99)
Potassium: 3.6 mmol/L (ref 3.5–5.1)
Sodium: 143 mmol/L (ref 135–145)

## 2021-04-27 LAB — MAGNESIUM: Magnesium: 1.7 mg/dL (ref 1.7–2.4)

## 2021-04-27 LAB — CBC
HCT: 33.7 % — ABNORMAL LOW (ref 39.0–52.0)
Hemoglobin: 10.2 g/dL — ABNORMAL LOW (ref 13.0–17.0)
MCH: 25.2 pg — ABNORMAL LOW (ref 26.0–34.0)
MCHC: 30.3 g/dL (ref 30.0–36.0)
MCV: 83.2 fL (ref 80.0–100.0)
Platelets: 268 10*3/uL (ref 150–400)
RBC: 4.05 MIL/uL — ABNORMAL LOW (ref 4.22–5.81)
RDW: 20.3 % — ABNORMAL HIGH (ref 11.5–15.5)
WBC: 12.9 10*3/uL — ABNORMAL HIGH (ref 4.0–10.5)
nRBC: 0 % (ref 0.0–0.2)

## 2021-04-27 LAB — GLUCOSE, CAPILLARY
Glucose-Capillary: 107 mg/dL — ABNORMAL HIGH (ref 70–99)
Glucose-Capillary: 120 mg/dL — ABNORMAL HIGH (ref 70–99)
Glucose-Capillary: 106 mg/dL — ABNORMAL HIGH (ref 70–99)
Glucose-Capillary: 113 mg/dL — ABNORMAL HIGH (ref 70–99)

## 2021-04-27 LAB — PROTIME-INR
INR: 1.4 — ABNORMAL HIGH (ref 0.8–1.2)
Prothrombin Time: 16.6 seconds — ABNORMAL HIGH (ref 11.4–15.2)

## 2021-04-27 NOTE — Progress Notes (Signed)
PROGRESS NOTE    Ryan [name]  [id number] DOB: 11/09/1945 DOA: 04/08/2021 PCP: [name], MD   Brief Narrative:  75 y.o. 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  Scheduled to have debridement on 04/08/2021 but due to an INR of 3.2 surgery was postponed.  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  He has completed his course of antibiotics in house last day was 04/13/2019.  He developed a retroperitoneal hematoma with left kidney displacement and hypotension developed ATN and hyperkalemia, nephrology was consulted recommended renal replacement therapy, urology was consulted due to his left hydronephrosis would like recommended a left nephrostomy tube.  Nephrostomy tube placed on 8/1.   Assessment & Plan:   Principal Problem:   Critical lower limb ischemia (HCC) Active Problems:   Chronic systolic heart failure (HCC)   Status post mitral valve replacement with bioprosthetic valve   Diabetes mellitus ([place])   Essential hypertension   ICD (implantable cardioverter-defibrillator) in place   Stage 3 chronic kidney disease ([place])   Pressure injury of skin   Right critical lower limb ischemia [facility]): Vascular Surgery-recommends dressing changes, wound care.  Wound VAC in place.    LE Dopplers-negative for DVT. Final plans per vascular.    New retroperitoneal hematoma/acute blood loss anemia: -CT scan of the abdomen and pelvis showed new retroperitoneal hematoma he was given Vit K & FFP. Repeat CT A/P on 8/5- remains large unchanged except increase in Pleural effusion vs atelactesis.   B/L  Opacity -pleural effusion versus atelectasis.  Incentive spirometer, flutter valve, out of bed to chair - CXR showed opacity, Elevated BNP. Lasix '20mg'$  po daily.    AKI from ATN on chronic kidney disease stage III: Creatinine peaked at 5.63; Improving.  -Secondary to hypotension, retroperitoneal bleed displacing left kidney - Status post HD 8/2.  Creatinine 1.41 - Seen by nephro, urology and IR.  Follow-up urology outpatient as necessary.  Spoke with IR, Dr. Solmon Ice PCN at least for 2 months, follow-up will be arranged by their team.  Recommend routine drainage of catheter/bag and dry dressing changes.  Chronic systolic heart failure ([place]), EF 40-45% -On Midodrine '10mg'$  po tid.   Echo 03/04/2021-EF 40-45%, global hypokinesis.  Follow-up outpatient CHF. Resume lasix '20mg'$  po daily    Permanent atrial fibrillation/Status post mitral valve replacement with bioprosthetic valve: Bioprosthetic valve. Anticoagulation on hold.  Amiodarone '200mg'$  daily    Diabetes mellitus type 2: Acceptable range.  -Hemoglobin A1c 5.4.  Insulin sliding scale and Accu-Cheks  Hx of Essential HTN but currently borderline low BP -midodrine 10 mg TID  Hyperlipidemia: -Lipitor 40 mg daily   Ethics/goals of care: Seen by Palliative Care    DVT prophylaxis:   No anticoagulation due to ongoing bleeding.  Unable to place SCD due to anatomy and wound care issues Code Status: Full code Family Communication:  Son [name] Updated periodically  Status is: Inpatient  Remains inpatient appropriate because:IV treatments appropriate due to intensity of illness or inability to take PO  Dispo: The patient is from: Home              Anticipated d/c is to: SNF  Patient currently is not medically stable to d/c.  Awaiting final plan per vascular team prior to discharge.   Difficult to place patient No       Nutritional status  Nutrition Problem: Increased nutrient needs Etiology: post-op  healing  Signs/Symptoms: estimated needs  Interventions: MVI, Juven, Magic cup, Premier Protein  Body mass index is 26.58 kg/m.  Pressure Injury 04/21/21 Coccyx Medial Stage 2 -  Partial thickness loss of dermis presenting as a shallow open injury with a red, pink wound bed without slough. (Active)  04/21/21 1255  Location: Coccyx  Location Orientation: Medial  Staging: Stage 2 -  Partial thickness loss of dermis presenting as a shallow open injury with a red, pink wound bed without slough.  Wound Description (Comments):   Present on Admission:           Subjective: Feeling okay Lower extremity discomfort after his dressing change this morning  Examination: Constitutional: Not in acute distress Respiratory: Clear to auscultation bilaterally Cardiovascular: Normal sinus rhythm, no rubs Abdomen: Nontender nondistended good bowel sounds Musculoskeletal: No edema noted Skin: Right lower extremity dressing noted Neurologic: CN 2-12 grossly intact.  And nonfocal Psychiatric: Normal judgment and insight. Alert and oriented x 3. Normal mood.  Nephrostomy tube noted External Foley catheter  Objective: Vitals:   04/27/21 0457 04/27/21 0708 04/27/21 0743 04/27/21 0837  BP: 131/70   138/69  Pulse: (!) 104 73 75 79  Resp:   16 18  Temp:    99 F (37.2 C)  TempSrc:    Oral  SpO2: 95% 98% 98% 98%  Weight:      Height:        Intake/Output Summary (Last 24 hours) at 04/27/2021 1149 Last data filed at 04/27/2021 [id number] Gross per 24 hour  Intake 300 ml  Output 435 ml  Net -135 ml   Filed Weights   04/23/21 0414 04/24/21 0552 04/25/21 0355  Weight: 90.3 kg 90.3 kg 88.9 kg     Data Reviewed:   CBC: Recent Labs  Lab 04/23/21 0209 04/24/21 0320 04/25/21 0137 04/26/21 0125 04/27/21 0157  WBC 11.0* 11.2* 12.6* 14.0* 12.9*  HGB 10.2* 9.6* 10.3* 10.7* 10.2*  HCT 32.3* 29.7* 31.8* 33.7* 33.7*  MCV 82.0 81.6 80.9 81.6 83.2  PLT 217 224 277 302 [id number]   Basic Metabolic  Panel: Recent Labs  Lab 04/23/21 0209 04/24/21 0320 04/25/21 0137 04/26/21 0125 04/27/21 0157  NA 135 137 138 141 143  K 3.3* 3.3* 3.6 3.9 3.6  CL 101 107 108 111 113*  CO2 '22 23 22 '$ 21* 19*  GLUCOSE 108* 111* 131* 143* 113*  BUN 56* 45* 37* 32* 30*  CREATININE 2.01* 1.41* 1.24 1.39* 1.40*  CALCIUM 8.3* 8.0* 8.3* 8.6* 8.4*  MG 2.4 2.1 2.1 1.9 1.7  PHOS  --   --  1.8*  --   --    GFR: Estimated Creatinine Clearance: 48.5 mL/min (A) (by C-G formula based on SCr of 1.4 mg/dL (H)). Liver Function Tests: Recent Labs  Lab 04/25/21 0137  ALBUMIN 2.2*   No results for input(s): LIPASE, AMYLASE in the last 168 hours. No results for input(s): AMMONIA in the last 168 hours. Coagulation Profile: Recent Labs  Lab 04/23/21 0209 04/24/21 0320 04/25/21 0137 04/26/21 0125 04/27/21 0157  INR 1.4* 1.4* 1.3* 1.4* 1.4*   Cardiac Enzymes: No results for input(s): CKTOTAL, CKMB, CKMBINDEX, TROPONINI in the last 168 hours.  BNP (last 3 results) Recent Labs    08/25/20 1121 03/27/21  [place]* 3,146*   HbA1C: No results for input(s): HGBA1C in the last 72 hours. CBG: Recent Labs  Lab 04/26/21 0617 04/26/21 0814 04/26/21 1732 04/26/21 2116 04/27/21 0621  GLUCAP 125* 106* 122* 145* 107*   Lipid Profile: No results for input(s): CHOL, HDL, LDLCALC, TRIG, CHOLHDL, LDLDIRECT in the last 72 hours. Thyroid Function Tests: No results for input(s): TSH, T4TOTAL, FREET4, T3FREE, THYROIDAB in the last 72 hours. Anemia Panel: No results for input(s): VITAMINB12, FOLATE, FERRITIN, TIBC, IRON, RETICCTPCT in the last 72 hours. Sepsis Labs: No results for input(s): PROCALCITON, LATICACIDVEN in the last 168 hours.   Recent Results (from the past 240 hour(s))  Urine Culture     Status: None   Collection Time: 04/20/21 11:47 AM   Specimen: Urine, Clean Catch  Result Value Ref Range Status   Specimen Description URINE, CLEAN CATCH  Final   Special Requests NONE  Final    Culture   Final    NO GROWTH Performed at [facility], [address], [place], [place] [address]    Report Status 04/21/2021 FINAL  Final         Radiology Studies: DG CHEST PORT 1 VIEW  Result Date: 04/26/2021 CLINICAL DATA:  Hypoxia EXAM: PORTABLE CHEST 1 VIEW COMPARISON:  04/25/2021 FINDINGS: Cardiac shadow is enlarged but stable. Postsurgical changes are noted. Defibrillator is again seen and stable. The lungs are well aerated bilaterally. Mild left basilar opacity is again identified and stable. Patchy right basilar opacity is noted new from the prior exam. No bony abnormality is seen. IMPRESSION: Stable left basilar opacity. New patchy right basilar opacity. Electronically Signed   By: Inez Catalina M.D.   On: 04/26/2021 23:32        Scheduled Meds:  (feeding supplement) PROSource Plus  30 mL Oral BID BM   amiodarone  200 mg Oral Daily   atorvastatin  40 mg Oral q1800   B-complex with vitamin C  1 tablet Oral Daily   docusate sodium  100 mg Oral BID   feeding supplement (NEPRO CARB STEADY)  237 mL Oral TID BM   furosemide  20 mg Oral Daily   insulin aspart  0-15 Units Subcutaneous TID WC   ipratropium-albuterol  3 mL Nebulization BID   midodrine  10 mg Oral TID WC   pantoprazole  40 mg Oral Daily   sodium chloride flush  3 mL Intravenous Q12H   Continuous Infusions:  cefTRIAXone (ROCEPHIN)  IV 1 g (04/27/21 1111)     LOS: 19 days   Time spent= 35 mins    Ronnica Dreese Arsenio Loader, MD [facility]  If 7PM-7AM, please contact night-coverage  04/27/2021, 11:49 AM

## 2021-04-27 NOTE — Progress Notes (Signed)
Mobility Specialist: Progress Note   04/27/21 1228  Mobility  Activity Transferred:  Bed to chair  Level of Assistance +2 (takes two people)  Assistive Device Sliding board  Mobility Out of bed to chair with meals  Mobility Response Tolerated well  Mobility performed by Mobility specialist;Other (comment) (PT [place])  Bed Position Chair  $Mobility charge 1 Mobility   Post-Mobility: 94 HR, 92% SpO2  Pt transferred on 2 L/min [place]. Pt required modA to scoot as well as verbal cues and some physical assistance for hand and foot placement. Once in the chair pt required heavy modA to stand at the [facility] to adjust geomat and pad in the chair and then sat back down. PT present in the room.   [facility] Murrel Bertram Mobility Specialist Mobility Specialist Phone: [phone]

## 2021-04-27 NOTE — Consult Note (Signed)
[place] Nurse wound follow up Wound type:surgical debridement right heel with application of Myriad skin substitute, sutured in place.  Measurement: 4 cm x 4 cm myriad covering Wound bed: skin substitute sutured in place.  Drainage (amount, consistency, odor) minimal serosanguinous musty odor Periwound: purple lesions remain and one is peeling in the area where trac pad was bridged.  I have covered this general area with skin prep and will be bridging dressing to distal dorsal foot near base of toes.  Dressing procedure/placement/frequency: Bridge dressing to dorsal foot.   Seal immediately achieved   Wrapped with ace bandage. Change Mon/Wed/Fri. Supplies in [employer].  Will follow.  Domenic Moras MSN, RN, FNP-BC CWON Wound, Ostomy, Continence Nurse Pager [phone]

## 2021-04-27 NOTE — Progress Notes (Signed)
Mobility Specialist: Progress Note   04/27/21 1632  Mobility  Activity Transferred:  Chair to bed  Level of Assistance +2 (takes two people)  Assistive Device [place]  Mobility Out of bed to chair with meals  Mobility Response Tolerated well  Mobility performed by Mobility specialist;Nurse tech  $Mobility charge 1 Mobility   Pt required modA to stand from chair with assistance from NT and myself. Pt back in bed with call bell at his side and NT present in the room.   [facility] Arwyn Besaw Mobility Specialist Mobility Specialist Phone: [phone]

## 2021-04-27 NOTE — Progress Notes (Signed)
No ICM remote transmission received for 04/27/2021 due to patient is currently hospitalized and next ICM transmission scheduled for 05/18/2021.

## 2021-04-27 NOTE — Progress Notes (Signed)
Physical Therapy Treatment Patient Details Name: Ryan [name] MRN: [id number] DOB: [birth year] Today's Date: 04/27/2021    History of Present Illness Pt 75 y.o. 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 Scheduled to have debridement on 7/20 but due to an INR of 3.2 surgery was postponed. He eventually underwent partial foot/toe amputation with excisional debridement and wound VAC placement on 7/21, left lower extremity angiogram in 7/25, and R IJ temp HD catheter placement on 8/1.  Pt with L sided hydronephrosis possibly secondary to L retroperitoneal hematoma, s/p nephrostomy tube on 8/1 to drain.  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    PT Comments    Pt making gradual progress.  He is not able to maintain NWB on R LE so performed lateral scoot and very limited sit to stand for geomat adjustment in chair.  Requiring mod-max A x 2.  With exercises pt demonstrated decreased strength on L side compared to right requiring assist (noted hematoma in L iliacus muscle per imaging and L LE with more edema than R likely contributing to weakness).      Follow Up Recommendations  SNF     Equipment Recommendations  Other (comment) (further assessment next venue)    Recommendations for Other Services       Precautions / Restrictions Precautions Precautions: Fall;Other (comment) Precaution Comments: L nephrostomy tube/drain Restrictions RLE Weight Bearing: Non weight bearing    Mobility  Bed Mobility Overal bed mobility: Needs Assistance Bed Mobility: Supine to Sit     Supine to sit: Mod assist     General bed mobility comments: Pt sliding legs to EOB with min A but mod A for trunk and to scoot forward    Transfers Overall transfer level: Needs assistance   Transfers: Sit to/from Stand;Lateral/Scoot  Transfers Sit to Stand: Max assist;+2 physical assistance        Lateral/Scoot Transfers: Mod assist;+2 physical assistance General transfer comment: Pt scooted to chair via lateral scoot with mod A x 2 and cues for hand placement.  Performed 1 sit to stand from chair with max A x 2 into STEDY while geomat straightened - pt unable to maintain NWB so returned to sitting once pad straightened.  Ambulation/Gait                 Stairs             Wheelchair Mobility    Modified Rankin (Stroke Patients Only)       Balance Overall balance assessment: Needs assistance Sitting-balance support: No upper extremity supported Sitting balance-Leahy Scale: Fair Sitting balance - Comments: Pt able to sit EOB and lateral scoot without assist for balance   Standing balance support: Bilateral upper extremity supported;During functional activity Standing balance-Leahy Scale: Zero Standing balance comment: requiring mod-max x 2 for partial stand                            Cognition Arousal/Alertness: Awake/alert Behavior During Therapy: WFL for tasks assessed/performed Overall Cognitive Status: No family/caregiver present to determine baseline cognitive functioning                                 General Comments: Followed basic commands      Exercises  General Exercises - Lower Extremity Ankle Circles/Pumps: AAROM;Right;AROM;Left;20 reps (10x2, gentle ROM on R) Quad Sets: AROM;Both;10 reps;Supine Long Arc Quad: AROM;Right;AAROM;Left;20 reps;Seated (10x2 with cues for full ROM) Hip Flexion/Marching: AROM;Right;AAROM;Left;20 reps (10x2; mod A on L)    General Comments General comments (skin integrity, edema, etc.): VSS      Pertinent Vitals/Pain Pain Assessment: No/denies pain    Home Living                      Prior Function            PT Goals (current goals can now be found in the care plan section) Acute Rehab PT  Goals Patient Stated Goal: none stated today PT Goal Formulation: With patient/family Time For Goal Achievement: 04/29/21 Potential to Achieve Goals: Fair Progress towards PT goals: Progressing toward goals    Frequency    Min 2X/week      PT Plan Current plan remains appropriate    Co-evaluation              AM-PAC PT "6 Clicks" Mobility   Outcome Measure  Help needed turning from your back to your side while in a flat bed without using bedrails?: A Lot Help needed moving from lying on your back to sitting on the side of a flat bed without using bedrails?: A Lot Help needed moving to and from a bed to a chair (including a wheelchair)?: Total Help needed standing up from a chair using your arms (e.g., wheelchair or bedside chair)?: Total Help needed to walk in hospital room?: Total Help needed climbing 3-5 steps with a railing? : Total 6 Click Score: 8    End of Session Equipment Utilized During Treatment: Gait belt Activity Tolerance: Patient tolerated treatment well Patient left: with chair alarm set;in chair;with call bell/phone within reach Nurse Communication: Mobility status;Need for lift equipment (hoyer pad in room) PT Visit Diagnosis: Unsteadiness on feet (R26.81);Other abnormalities of gait and mobility (R26.89);Muscle weakness (generalized) (M62.81);Pain     Time: [id number] PT Time Calculation (min) (ACUTE ONLY): 33 min  Charges:  $Therapeutic Exercise: 8-22 mins $Therapeutic Activity: 8-22 mins                     Ryan [name], PT Acute Rehab Services Pager [phone] [name] [phone]    Ryan [name] 04/27/2021, 2:30 PM

## 2021-04-28 ENCOUNTER — Encounter: Payer: Medicare HMO | Admitting: Vascular Surgery

## 2021-04-28 DIAGNOSIS — I70229 Atherosclerosis of native arteries of extremities with rest pain, unspecified extremity: Secondary | ICD-10-CM | POA: Diagnosis not present

## 2021-04-28 LAB — RESP PANEL BY RT-PCR (FLU A&B, COVID) ARPGX2
Influenza A by PCR: NEGATIVE
Influenza B by PCR: NEGATIVE
SARS Coronavirus 2 by RT PCR: POSITIVE — AB

## 2021-04-28 LAB — BASIC METABOLIC PANEL
Anion gap: 9 (ref 5–15)
BUN: 30 mg/dL — ABNORMAL HIGH (ref 8–23)
CO2: 21 mmol/L — ABNORMAL LOW (ref 22–32)
Calcium: 8.5 mg/dL — ABNORMAL LOW (ref 8.9–10.3)
Chloride: 114 mmol/L — ABNORMAL HIGH (ref 98–111)
Creatinine, Ser: 1.36 mg/dL — ABNORMAL HIGH (ref 0.61–1.24)
GFR, Estimated: 54 mL/min — ABNORMAL LOW (ref >60)
Glucose, Bld: 109 mg/dL — ABNORMAL HIGH (ref 70–99)
Potassium: 3.5 mmol/L (ref 3.5–5.1)
Sodium: 144 mmol/L (ref 135–145)

## 2021-04-28 LAB — BRAIN NATRIURETIC PEPTIDE: B Natriuretic Peptide: 1706.1 pg/mL — ABNORMAL HIGH (ref 0.0–100.0)

## 2021-04-28 LAB — CBC
HCT: 31.7 % — ABNORMAL LOW (ref 39.0–52.0)
Hemoglobin: 9.7 g/dL — ABNORMAL LOW (ref 13.0–17.0)
MCH: 25.4 pg — ABNORMAL LOW (ref 26.0–34.0)
MCHC: 30.6 g/dL (ref 30.0–36.0)
MCV: 83 fL (ref 80.0–100.0)
Platelets: 299 10*3/uL (ref 150–400)
RBC: 3.82 MIL/uL — ABNORMAL LOW (ref 4.22–5.81)
RDW: 20.4 % — ABNORMAL HIGH (ref 11.5–15.5)
WBC: 13.3 10*3/uL — ABNORMAL HIGH (ref 4.0–10.5)
nRBC: 0 % (ref 0.0–0.2)

## 2021-04-28 LAB — GLUCOSE, CAPILLARY
Glucose-Capillary: 105 mg/dL — ABNORMAL HIGH (ref 70–99)
Glucose-Capillary: 98 mg/dL (ref 70–99)
Glucose-Capillary: 97 mg/dL (ref 70–99)
Glucose-Capillary: 82 mg/dL (ref 70–99)

## 2021-04-28 LAB — PROTIME-INR
INR: 1.4 — ABNORMAL HIGH (ref 0.8–1.2)
Prothrombin Time: 17.1 seconds — ABNORMAL HIGH (ref 11.4–15.2)

## 2021-04-28 MED ORDER — IPRATROPIUM-ALBUTEROL 20-100 MCG/ACT IN AERS
1.0000 | INHALATION_SPRAY | Freq: Four times a day (QID) | RESPIRATORY_TRACT | Status: DC
  Administered 2021-04-30 – 2021-05-04 (×11): 1 via RESPIRATORY_TRACT
  Filled 2021-04-28 (×3): qty 4

## 2021-04-28 MED ORDER — FUROSEMIDE 10 MG/ML IJ SOLN
INTRAMUSCULAR | Status: AC
  Filled 2021-04-28: qty 2

## 2021-04-28 MED ORDER — POTASSIUM CHLORIDE CRYS ER 20 MEQ PO TBCR
40.0000 meq | EXTENDED_RELEASE_TABLET | Freq: Once | ORAL | Status: AC
  Administered 2021-04-28: 40 meq via ORAL
  Filled 2021-04-28: qty 2

## 2021-04-28 MED ORDER — FUROSEMIDE 10 MG/ML IJ SOLN
20.0000 mg | Freq: Once | INTRAMUSCULAR | Status: AC
  Administered 2021-04-28: 20 mg via INTRAVENOUS
  Filled 2021-04-28: qty 2

## 2021-04-28 NOTE — Consult Note (Signed)
   [facility] Inpatient Consult   04/28/2021  Ryan [name] June 11, 1946 [id number]  [facility] [ACO] Patient: [name]   Follow up:  [name],   Patient reviewed for progress and disposition. Reviewed for PT/OT recommendations ongoing for a skilled nursing facility level of care.   Spoke with patient earlier. Patient continues to be hopeful to transition to [name] with his son/family.  Plan:  Continue to follow up with progress and needs, if patient transitions to a skilled facility or to [name], his needs will be met at that level of care.  For questions, please contact:  Natividad Brood, RN BSN [facility] Liaison  [phone] business mobile phone Toll free office [phone]  Fax number: [phone] [email or web address]

## 2021-04-28 NOTE — Progress Notes (Addendum)
PROGRESS NOTE    Ryan [name]  XNA:[id number] DOB: 23-Mar-1946 DOA: 04/08/2021 PCP: [name], MD   Brief Narrative:  75 y.o. 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  Scheduled to have debridement on 04/08/2021 but due to an INR of 3.2 surgery was postponed.  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  He has completed his course of antibiotics in house last day was 04/13/2019.  He developed a retroperitoneal hematoma with left kidney displacement and hypotension developed ATN and hyperkalemia, nephrology was consulted recommended renal replacement therapy, urology was consulted due to his left hydronephrosis would like recommended a left nephrostomy tube.  Nephrostomy tube placed on 8/1 and central line placement for HD.  Patient did well after 1 HD treatment and renal function recovered.  HD catheter removed.  PT recommended SNF.  Cleared by vascular, 1-2-week follow improvement be arranged by their service.   Assessment & Plan:   Principal Problem:   Critical lower limb ischemia (HCC) Active Problems:   Chronic systolic heart failure (HCC)   Status post mitral valve replacement with bioprosthetic valve   Diabetes mellitus ([place])   Essential hypertension   ICD (implantable cardioverter-defibrillator) in place   Stage 3 chronic kidney disease ([place])   Pressure injury of skin   Right critical lower limb ischemia [facility]): Vascular Surgery-recommends dressing changes, wound care.  Seen by wound care team.  Lower extremity Dopplers negative for DVT.  Surgical debridement of the right heel with  application of myriad substitute 8/8.  Spoke with vascular surgery, Dr. Luan Pulling who recommended routine dressing changes.  Outpatient follow-up in 1-2 weeks will be arranged by their service.   New retroperitoneal hematoma/acute blood loss anemia: -CT scan of the abdomen and pelvis showed new retroperitoneal hematoma he was given Vit K & FFP. Repeat CT A/P on 8/5- remains large unchanged except increase in Pleural effusion vs atelactesis.   B/L Opacity -pleural effusion versus atelectasis.  Incentive spirometer, flutter valve, out of bed to chair - CXR showed opacity, Elevated BNP. Lasix $RemoveBe'20mg'gXbAxDtdn$  po daily.  Will give additional dose of 20 mg IV Lasix today.  I think with mobility is atelectasis and effusion should improve.  For short-term if he needs supplemental oxygen, it can be acceptable.  Tachycardia, now resolved -In the morning patient was tachycardic which has now resolved.  EKG showed sinus tachycardia.  Currently heart rate now in the 70s.   AKI from ATN on chronic kidney disease stage III: Creatinine peaked at 5.63; Improving.  -Secondary to hypotension and retroperitoneal bleed displacing left kidney - Status post HD 8/2 which has been removed now.  Creatinine 1.36 - Seen by nephro, urology and IR.  Follow-up urology outpatient as necessary.  Spoke with IR, Dr. Earleen Newport 8/8-continue PCN at least for 2 months, follow-up will be arranged by their team.  Recommend routine drainage of catheter/bag and dry dressing changes.  Chronic systolic heart failure ([place]), EF 40-45% -On Midodrine $RemoveBefor'10mg'GjGmzLGWkNdG$  po tid.   Echo 03/04/2021-EF 40-45%, global hypokinesis.  Follow-up outpatient CHF. Resume lasix $RemoveBefo'20mg'POsuFMasnNQ$  po daily    Permanent atrial fibrillation/Status post mitral valve replacement with bioprosthetic valve: Bioprosthetic valve. Anticoagulation on hold.  Amiodarone $RemoveBef'200mg'koIZiUUMTQ$  daily  Diabetes mellitus type 2: Acceptable range.  -Hemoglobin A1c 5.4.  Insulin sliding scale and Accu-Cheks  Hx of Essential HTN  but currently borderline low BP -midodrine 10 mg TID  Hyperlipidemia: -Lipitor 40 mg daily   Ethics/goals of care: Seen by Palliative Care    DVT prophylaxis:   No anticoagulation due to ongoing bleeding.  Unable to place SCD due to anatomy and wound care issues Code Status: Full code Family Communication:  Son [name] Updated periodically  Status is: Inpatient  Remains inpatient appropriate because:IV treatments appropriate due to intensity of illness or inability to take PO  Dispo: The patient is from: Home              Anticipated d/c is to: SNF              Patient currently is medically stable for discharge to SNF.  TOC team has been consulted.   Difficult to place patient No       Nutritional status  Nutrition Problem: Increased nutrient needs Etiology: post-op healing  Signs/Symptoms: estimated needs  Interventions: MVI, Juven, Magic cup, Premier Protein  Body mass index is 26.04 kg/m.  Pressure Injury 04/21/21 Coccyx Medial Stage 2 -  Partial thickness loss of dermis presenting as a shallow open injury with a red, pink wound bed without slough. (Active)  04/21/21 1255  Location: Coccyx  Location Orientation: Medial  Staging: Stage 2 -  Partial thickness loss of dermis presenting as a shallow open injury with a red, pink wound bed without slough.  Wound Description (Comments):   Present on Admission:           Subjective: Overall patient is doing better, no acute complaints.  This morning had sinus tachycardia which resolved by the time I visited him.  Examination: Constitutional: Not in acute distress.  On 2 L nasal cannula Respiratory: Mild bibasilar crackles Cardiovascular: Normal sinus rhythm, no rubs Abdomen: Nontender nondistended good bowel sounds Musculoskeletal: No edema noted Skin: Lower extremity dressing Neurologic: CN 2-12 grossly intact.  And nonfocal Psychiatric: Normal judgment and insight. Alert and oriented x 3. Normal  mood.  Nephrostomy tube noted External Foley catheter  Objective: Vitals:   04/27/21 2313 04/28/21 0308 04/28/21 0500 04/28/21 0744  BP: 117/71 119/73    Pulse: (!) 110 (!) 108 (!) 107   Resp: 16 16    Temp: 98.9 F (37.2 C) 98.8 F (37.1 C)    TempSrc: Oral Oral    SpO2: 92% 93% 94% 96%  Weight:   87.1 kg   Height:        Intake/Output Summary (Last 24 hours) at 04/28/2021 0836 Last data filed at 04/28/2021 0654 Gross per 24 hour  Intake 240 ml  Output 630 ml  Net -390 ml   Filed Weights   04/24/21 0552 04/25/21 0355 04/28/21 0500  Weight: 90.3 kg 88.9 kg 87.1 kg     Data Reviewed:   CBC: Recent Labs  Lab 04/24/21 0320 04/25/21 0137 04/26/21 0125 04/27/21 0157 04/28/21 0116  WBC 11.2* 12.6* 14.0* 12.9* 13.3*  HGB 9.6* 10.3* 10.7* 10.2* 9.7*  HCT 29.7* 31.8* 33.7* 33.7* 31.7*  MCV 81.6 80.9 81.6 83.2 83.0  PLT 224 277 302 268 786   Basic Metabolic Panel: Recent Labs  Lab 04/23/21 0209 04/24/21 0320 04/25/21 0137 04/26/21 0125 04/27/21 0157 04/28/21 0116  NA 135 137 138 141 143 144  K 3.3* 3.3* 3.6 3.9 3.6 3.5  CL 101 107 108 111 113* 114*  CO2  22 23 22  21* 19* 21*  GLUCOSE 108* 111* 131* 143* 113* 109*  BUN 56* 45* 37* 32* 30* 30*  CREATININE 2.01* 1.41* 1.24 1.39* 1.40* 1.36*  CALCIUM 8.3* 8.0* 8.3* 8.6* 8.4* 8.5*  MG 2.4 2.1 2.1 1.9 1.7  --   PHOS  --   --  1.8*  --   --   --    GFR: Estimated Creatinine Clearance: 49.9 mL/min (A) (by C-G formula based on SCr of 1.36 mg/dL (H)). Liver Function Tests: Recent Labs  Lab 04/25/21 0137  ALBUMIN 2.2*   No results for input(s): LIPASE, AMYLASE in the last 168 hours. No results for input(s): AMMONIA in the last 168 hours. Coagulation Profile: Recent Labs  Lab 04/24/21 0320 04/25/21 0137 04/26/21 0125 04/27/21 0157 04/28/21 0116  INR 1.4* 1.3* 1.4* 1.4* 1.4*   Cardiac Enzymes: No results for input(s): CKTOTAL, CKMB, CKMBINDEX, TROPONINI in the last 168 hours.  BNP (last 3  results) Recent Labs    08/25/20 1121 03/27/21 1123  PROBNP 21,395* 3,146*   HbA1C: No results for input(s): HGBA1C in the last 72 hours. CBG: Recent Labs  Lab 04/27/21 0621 04/27/21 1240 04/27/21 1656 04/27/21 2131 04/28/21 0624  GLUCAP 107* 120* 106* 113* 105*   Lipid Profile: No results for input(s): CHOL, HDL, LDLCALC, TRIG, CHOLHDL, LDLDIRECT in the last 72 hours. Thyroid Function Tests: No results for input(s): TSH, T4TOTAL, FREET4, T3FREE, THYROIDAB in the last 72 hours. Anemia Panel: No results for input(s): VITAMINB12, FOLATE, FERRITIN, TIBC, IRON, RETICCTPCT in the last 72 hours. Sepsis Labs: No results for input(s): PROCALCITON, LATICACIDVEN in the last 168 hours.   Recent Results (from the past 240 hour(s))  Urine Culture     Status: None   Collection Time: 04/20/21 11:47 AM   Specimen: Urine, Clean Catch  Result Value Ref Range Status   Specimen Description URINE, CLEAN CATCH  Final   Special Requests NONE  Final   Culture   Final    NO GROWTH Performed at [facility], [address] [place], [place]    Report Status 04/21/2021 FINAL  Final         Radiology Studies: DG CHEST PORT 1 VIEW  Result Date: 04/26/2021 CLINICAL DATA:  Hypoxia EXAM: PORTABLE CHEST 1 VIEW COMPARISON:  04/25/2021 FINDINGS: Cardiac shadow is enlarged but stable. Postsurgical changes are noted. Defibrillator is again seen and stable. The lungs are well aerated bilaterally. Mild left basilar opacity is again identified and stable. Patchy right basilar opacity is noted new from the prior exam. No bony abnormality is seen. IMPRESSION: Stable left basilar opacity. New patchy right basilar opacity. Electronically Signed   By: 06/25/2021 M.D.   On: 04/26/2021 23:32        Scheduled Meds:  (feeding supplement) PROSource Plus  30 mL Oral BID BM   amiodarone  200 mg Oral Daily   atorvastatin  40 mg Oral q1800   B-complex with vitamin C  1 tablet Oral Daily    docusate sodium  100 mg Oral BID   feeding supplement (NEPRO CARB STEADY)  237 mL Oral TID BM   furosemide  20 mg Oral Daily   insulin aspart  0-15 Units Subcutaneous TID WC   ipratropium-albuterol  3 mL Nebulization BID   midodrine  10 mg Oral TID WC   pantoprazole  40 mg Oral Daily   potassium chloride  40 mEq Oral Once   sodium chloride flush  3 mL Intravenous  Q12H   Continuous Infusions:     LOS: 20 days   Time spent= 35 mins    Bricelyn Freestone Arsenio Loader, MD [facility]  If 7PM-7AM, please contact night-coverage  04/28/2021, 8:36 AM

## 2021-04-28 NOTE — Care Management Important Message (Signed)
Important Message  Patient Details  Name: Ryan [name] MRN: [id number] Date of Birth: [birth year]   Medicare Important Message Given:  Yes     Shelda Altes 04/28/2021, 9:58 AM

## 2021-04-29 ENCOUNTER — Inpatient Hospital Stay (HOSPITAL_COMMUNITY): Payer: Medicare HMO

## 2021-04-29 DIAGNOSIS — I70229 Atherosclerosis of native arteries of extremities with rest pain, unspecified extremity: Secondary | ICD-10-CM | POA: Diagnosis not present

## 2021-04-29 LAB — LACTATE DEHYDROGENASE: LDH: 521 U/L — ABNORMAL HIGH (ref 98–192)

## 2021-04-29 LAB — C-REACTIVE PROTEIN: CRP: 25.7 mg/dL — ABNORMAL HIGH (ref <1.0)

## 2021-04-29 LAB — D-DIMER, QUANTITATIVE: D-Dimer, Quant: >20 ug/mL-FEU — ABNORMAL HIGH (ref 0.00–0.50)

## 2021-04-29 LAB — PROCALCITONIN: Procalcitonin: 0.53 ng/mL

## 2021-04-29 LAB — BASIC METABOLIC PANEL
Anion gap: 11 (ref 5–15)
BUN: 28 mg/dL — ABNORMAL HIGH (ref 8–23)
CO2: 20 mmol/L — ABNORMAL LOW (ref 22–32)
Calcium: 8.3 mg/dL — ABNORMAL LOW (ref 8.9–10.3)
Chloride: 113 mmol/L — ABNORMAL HIGH (ref 98–111)
Creatinine, Ser: 1.37 mg/dL — ABNORMAL HIGH (ref 0.61–1.24)
GFR, Estimated: 53 mL/min — ABNORMAL LOW (ref >60)
Glucose, Bld: 84 mg/dL (ref 70–99)
Potassium: 3.8 mmol/L (ref 3.5–5.1)
Sodium: 144 mmol/L (ref 135–145)

## 2021-04-29 LAB — GLUCOSE, CAPILLARY
Glucose-Capillary: 84 mg/dL (ref 70–99)
Glucose-Capillary: 101 mg/dL — ABNORMAL HIGH (ref 70–99)
Glucose-Capillary: 112 mg/dL — ABNORMAL HIGH (ref 70–99)

## 2021-04-29 LAB — PHOSPHORUS: Phosphorus: 3.1 mg/dL (ref 2.5–4.6)

## 2021-04-29 LAB — BRAIN NATRIURETIC PEPTIDE: B Natriuretic Peptide: 2208.9 pg/mL — ABNORMAL HIGH (ref 0.0–100.0)

## 2021-04-29 LAB — MAGNESIUM: Magnesium: 1.7 mg/dL (ref 1.7–2.4)

## 2021-04-29 LAB — FERRITIN: Ferritin: 995 ng/mL — ABNORMAL HIGH (ref 24–336)

## 2021-04-29 MED ORDER — DEXAMETHASONE 2 MG PO TABS
6.0000 mg | ORAL_TABLET | Freq: Every day | ORAL | Status: AC
  Administered 2021-04-30 – 2021-05-09 (×10): 6 mg via ORAL
  Filled 2021-04-29: qty 3
  Filled 2021-04-29 (×2): qty 1
  Filled 2021-04-29 (×2): qty 3
  Filled 2021-04-29 (×2): qty 1
  Filled 2021-04-29 (×5): qty 3

## 2021-04-29 MED ORDER — PROSOURCE TF PO LIQD
45.0000 mL | Freq: Three times a day (TID) | ORAL | Status: DC
  Administered 2021-04-29 – 2021-05-11 (×33): 45 mL
  Filled 2021-04-29 (×31): qty 45

## 2021-04-29 MED ORDER — SODIUM CHLORIDE 0.9 % IV SOLN
200.0000 mg | Freq: Once | INTRAVENOUS | Status: AC
  Administered 2021-04-29: 200 mg via INTRAVENOUS
  Filled 2021-04-29: qty 40

## 2021-04-29 MED ORDER — ENSURE ENLIVE PO LIQD
237.0000 mL | Freq: Two times a day (BID) | ORAL | Status: DC
  Administered 2021-04-29 – 2021-05-15 (×15): 237 mL via ORAL

## 2021-04-29 MED ORDER — ADULT MULTIVITAMIN W/MINERALS CH
1.0000 | ORAL_TABLET | Freq: Every day | ORAL | Status: DC
  Administered 2021-04-29 – 2021-05-22 (×19): 1 via ORAL
  Filled 2021-04-29 (×20): qty 1

## 2021-04-29 MED ORDER — REMDESIVIR 100 MG IV SOLR
100.0000 mg | Freq: Every day | INTRAVENOUS | Status: AC
  Administered 2021-04-30 – 2021-05-03 (×4): 100 mg via INTRAVENOUS
  Filled 2021-04-29: qty 20
  Filled 2021-04-29 (×2): qty 100
  Filled 2021-04-29: qty 20

## 2021-04-29 MED ORDER — OSMOLITE 1.5 CAL PO LIQD
1200.0000 mL | ORAL | Status: DC
  Administered 2021-04-29: 1200 mL
  Administered 2021-04-30: 1000 mL
  Administered 2021-05-01 – 2021-05-04 (×3): 1200 mL
  Filled 2021-04-29 (×7): qty 2000

## 2021-04-29 MED ORDER — ASCORBIC ACID 500 MG PO TABS
500.0000 mg | ORAL_TABLET | Freq: Every day | ORAL | Status: DC
  Administered 2021-04-29 – 2021-05-18 (×18): 500 mg via ORAL
  Filled 2021-04-29 (×17): qty 1

## 2021-04-29 MED ORDER — ZINC SULFATE 220 (50 ZN) MG PO CAPS
220.0000 mg | ORAL_CAPSULE | Freq: Every day | ORAL | Status: DC
  Administered 2021-04-29 – 2021-05-22 (×22): 220 mg via ORAL
  Filled 2021-04-29 (×22): qty 1

## 2021-04-29 NOTE — Final Consult Note (Signed)
[place] Nurse wound follow up Patient was evaluated by Dagoberto Ligas, PA-C today and he decided not to continue the NPWT. WOC will sign off at this time but will be available to this patient. Please re-consult if needed.  Cathlean Marseilles Tamala Julian, MSN, RN, [place], Lysle Pearl, [facility] Wound Treatment Associate Pager [phone]

## 2021-04-29 NOTE — Progress Notes (Signed)
Patient ID: [name], male   DOB: [birth year], 75 y.o.   MRN: [id number]      Progress Note from the Palliative Medicine Team at [facility]   Patient Name: Ryan [name]        Date: 04/29/2021 DOB: Jul 13, 1946  Age: 75 y.o. MRN#: [id number] Attending Physician: Damita Lack, MD Primary Care Physician: [name], MD Admit Date: 04/08/2021   Medical records reviewed, discussed with team   75 y.o. male  with past medical history of atrial fibrillation on Coumadin, mitral valve replacement, chronic systolic heart failure with an AICD, essential hypertension, peripheral vascular disease, also history of peripheral vascular bypass with an ongoing wound infection admitted on 04/08/2021 for wound debridement. 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 Dropping hgb requiring transfusion - CT 7/30 revealed retroperitoneal hemorrhage.  PMT consulted to discuss [place] with family.  Worsening renal function HD initiated, kidney function eventually improved.   Patient has continued to improve physically, functionally and cognitively.  He is working with physical therapy and making gradual progress.  This NP visited patient at the bedside as a follow up for palliative medicine needs and emotional support.  Patient is alert and oriented and verbalizes that he is feeling stronger today.  He is hopeful and looks forward to transition to skilled nursing facility for rehab in the next few days.  Education offered again on the patient's multiple comorbidity, the importance of medical compliance and has high risk for decompensation secondary to multiple comorbidities.  Patient verbalizes understanding, he remains open to all offered and available medical interventions to prolong life.  He is hopeful for improvement and is contemplating a possible move to live with his son in [name].  Education offered to the patient the importance  of continued conversation with his family and the medical providers regarding overall plan of care and treatment options,  ensuring decisions are within the context of the patients values and GOCs.  Questions and concerns addressed   Discussed with Dr Reesa Chew   Total time spent on the unit was 35 minutes  Greater than 50% of the time was spent in counseling and coordination of care  Wadie Lessen NP  Palliative Medicine Team Team Phone # [phone] Pager [phone]

## 2021-04-29 NOTE — Progress Notes (Signed)
Nutrition Follow Up  DOCUMENTATION CODES:   Not applicable  INTERVENTION:   Tube feeding:  -Osmolite 1.5 @ 25 ml/hr via Cortrak -Increase by 10 ml Q4 hours to goal rate of 75 ml/hr x 16 hours (1700-0900) -ProSource TF 45 ml TID  Provides: 1920 kcals, 108 grams protein, 914 ml free water. Meets 87% kcal needs and 100% of protein needs.   Liberalize diet to regular with 1800 ml fluid restriction  Add Ensure Enlive po BID, each supplement provides 350 kcal and 20 grams of protein Continue MVI with minerals daily   NUTRITION DIAGNOSIS:   Increased nutrient needs related to post-op healing as evidenced by estimated needs.  Ongoing  GOAL:   Patient will meet greater than or equal to 90% of their needs  Not meeting by mouth, TF to start  MONITOR:   PO intake, Supplement acceptance, Diet advancement, Labs, Weight trends, Skin, I & O's  REASON FOR ASSESSMENT:   Consult Assessment of nutrition requirement/status, Wound healing  ASSESSMENT:   Ryan [name] is a 75 y.o. male with medical history significant of afib; MVR; chronic systolic CHF with AICD in place; DM; HTN; and PVD presenting for foot debridement/possible BKA.    He has had difficulty with a heel ulcer, not healing up well.  He was coming in today for surgery to have it cleaned up, possible amputation.  Unable to do the surgery because his INR was too high.  He takes Coumadin for afib.  Some days he feels ok and some he doesn't.  He has not feeling like he had a fever. Pt admitted rt foot wounds with critical limb ischemia.   7/21- s/p first/second R toe amputations, R heel excisional debridement, VAC placement 7/25- s/p LLE angiography 8/01- s/p nephrostomy tube, start iHD for AKI 8/03- underwent iHD  Tested positive for COVID yesterday. Will need RLE amputation per vascular. Patient would like to discuss with family. No plans for further HD.   Intake has dropped back down over the last 4-5 days. Meal  completions averaging 10-25%. Patient reports he does not feel hungry due to lethargy and pain. Taking Nepro supplements 1-2 times daily. Given fluctuating intake, Cortrak placed at bedside. Run tube feeding at night to allow for PO intake during the day. Titrate to goal slowly.   Admission weight: 73.1 kg  Current weight: 87.1 kg   UOP: 1350 ml x 24 hrs  Medications: 500 mg Vitamin C, decadron, colace, 20 mg lasix TID, 220 mg zinc sulfate  Labs: Cr 1.37- plateau CBG 82-101  Diet Order:   Diet Order             Diet renal/carb modified with fluid restriction Diet-HS Snack? Nothing; Fluid restriction: 1800 mL Fluid; Room service appropriate? Yes with Assist; Fluid consistency: Thin  Diet effective now                   EDUCATION NEEDS:   Education needs have been addressed  Skin:  Skin Assessment: Skin Integrity Issues: Skin Integrity Issues:: Other (Comment) Other: rt foot wound with critical limb ischemia, L heel and L flank  Stage II: medial coccyx  Last BM:  8/7  Height:   Ht Readings from Last 1 Encounters:  04/08/21 6' (1.829 m)    Weight:   Wt Readings from Last 1 Encounters:  04/28/21 87.1 kg    Ideal Body Weight:  80.9 kg  BMI:  Body mass index is 26.04 kg/m.  Estimated Nutritional Needs:  Kcal:  2200-2400  Protein:  105-120 grams  Fluid:  > 2 L  Alfonso Carden MS, RD, LDN, CNSC Clinical Nutrition Pager listed in [place]

## 2021-04-29 NOTE — Progress Notes (Signed)
PROGRESS NOTE    Ryan [name]  VZC:[id number] DOB: 11/15/1945 DOA: 04/08/2021 PCP: [name], MD   Brief Narrative:  75 y.o. 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  Scheduled to have debridement on 04/08/2021 but due to an INR of 3.2 surgery was postponed.  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  He has completed his course of antibiotics in house last day was 04/13/2019.  He developed a retroperitoneal hematoma with left kidney displacement and hypotension developed ATN and hyperkalemia, nephrology was consulted recommended renal replacement therapy, urology was consulted due to his left hydronephrosis would like recommended a left nephrostomy tube.  Nephrostomy tube placed on 8/1 and central line placement for HD.  Patient did well after 1 HD treatment and renal function recovered.  HD catheter removed.  PT recommended SNF.  Cleared by vascular, 1-2-week follow improvement be arranged by their service.  During screening status for SNF placement patient tested positive for COVID-19   Assessment & Plan:   Principal Problem:   Critical lower limb ischemia ([place]) Active Problems:   Chronic systolic heart failure (HCC)   Status post mitral valve replacement with bioprosthetic valve   Diabetes mellitus ([place])   Essential hypertension   ICD (implantable cardioverter-defibrillator) in place   Stage 3 chronic kidney disease (HCC)   Pressure injury of skin   B/L Opacity COVID-19 infection - Chest x-ray showed some opacity initially thought to be fluid or atelectasis but could be COVID-19 related.   Continue supplemental oxygen, currently on 3 L nasal cannula. - Start patient on remdesivir, steroids, I-S, flutter, bronchodilator/inhaler - Check ferritin, CRP, D-dimer, BNP  Right critical lower limb ischemia [facility]): Vascular Surgery-recommends dressing changes, wound care.  Seen by wound care team.  Lower extremity Dopplers negative for DVT.  Surgical debridement of the right heel with application of myriad substitute 8/8.  Spoke with vascular surgery, Dr. Luan Pulling who recommended routine dressing changes.  Outpatient follow-up in 1-2 weeks will be arranged by their service.   New retroperitoneal hematoma/acute blood loss anemia: -CT scan of the abdomen and pelvis showed new retroperitoneal hematoma he was given Vit K & FFP. Repeat CT A/P on 8/5- remains large unchanged except increase in Pleural effusion vs atelactesis.    AKI from ATN on chronic kidney disease stage III: Creatinine peaked at 5.63; Improving.  -Secondary to hypotension and retroperitoneal bleed displacing left kidney - Status post HD 8/2 which has been removed now.  Creatinine 1.37 - Seen by nephro, urology and IR.  Follow-up urology outpatient as necessary.  Spoke with IR, Dr. Earleen Newport 8/8-continue PCN at least for 2 months, follow-up will be arranged by their team.  Recommend routine drainage of catheter/bag and dry dressing changes.  Chronic systolic heart failure ([place]), EF 40-45% -On Midodrine $RemoveBefor'10mg'[id number]$  po tid.   Echo 03/04/2021-EF 40-45%, global hypokinesis.  Follow-up outpatient CHF.  On Lasix 20 mg daily, adjust as necessary   Permanent atrial fibrillation/Status post mitral valve replacement with bioprosthetic valve: Bioprosthetic valve. Anticoagulation on hold.  Amiodarone $RemoveBef'200mg'nWlcjSrDYi$  daily    Diabetes mellitus type 2: Acceptable range.  -Hemoglobin A1c 5.4.  Insulin sliding scale and Accu-Cheks  Hx of Essential HTN but currently borderline  low BP -midodrine 10 mg TID  Hyperlipidemia: -Lipitor 40 mg daily   Ethics/goals  of care: Seen by Palliative Care    DVT prophylaxis:   No anticoagulation due to ongoing bleeding.  Unable to place SCD due to anatomy and wound care issues Code Status: Full code Family Communication:  Son Updated  Status is: Inpatient  Remains inpatient appropriate because:IV treatments appropriate due to intensity of illness or inability to take PO  Dispo: The patient is from: Home              Anticipated d/c is to: SNF              Patient currently is not medically ready, now patient is diagnosed with covid 19.    Difficult to place patient No       Nutritional status  Nutrition Problem: Increased nutrient needs Etiology: post-op healing  Signs/Symptoms: estimated needs  Interventions: MVI, Juven, Magic cup, Premier Protein  Body mass index is 26.04 kg/m.  Pressure Injury 04/21/21 Coccyx Medial Stage 2 -  Partial thickness loss of dermis presenting as a shallow open injury with a red, pink wound bed without slough. (Active)  04/21/21 1255  Location: Coccyx  Location Orientation: Medial  Staging: Stage 2 -  Partial thickness loss of dermis presenting as a shallow open injury with a red, pink wound bed without slough.  Wound Description (Comments):   Present on Admission:           Subjective: Fever overnight.  T-max 101.4.  Doesn't have any new complaints.   Spoke with his son, he does not have any symptoms.  There have been multiple visitors throughout the week so he is going to call rest of the family to see if anyone has symptoms or diagnosed with COVID-19.  Examination: Constitutional: Not in acute distress, 2 L nasal cannula Respiratory: Mild bilateral rhonchi Cardiovascular: Normal sinus rhythm, no rubs Abdomen: Nontender nondistended good bowel sounds Musculoskeletal: No edema noted Skin: Lower extremity dressing in place Neurologic: CN 2-12 grossly intact.  And nonfocal Psychiatric: Normal judgment and insight. Alert and oriented x 3. Normal  mood.  Nephrostomy tube noted External Foley catheter  Objective: Vitals:   04/28/21 2000 04/28/21 2044 04/29/21 0014 04/29/21 1015  BP:  131/65 139/74 (!) 151/75  Pulse: 79 81 79 77  Resp:   20 20  Temp: (!) 101.4 F (38.6 C) (!) 101.4 F (38.6 C) 99.8 F (37.7 C) 98.9 F (37.2 C)  TempSrc:  Oral Oral Oral  SpO2: 96% 95% 95% 100%  Weight:      Height:        Intake/Output Summary (Last 24 hours) at 04/29/2021 1017 Last data filed at 04/29/2021 1016 Gross per 24 hour  Intake --  Output 2000 ml  Net -2000 ml   Filed Weights   04/24/21 0552 04/25/21 0355 04/28/21 0500  Weight: 90.3 kg 88.9 kg 87.1 kg     Data Reviewed:   CBC: Recent Labs  Lab 04/24/21 0320 04/25/21 0137 04/26/21 0125 04/27/21 0157 04/28/21 0116  WBC 11.2* 12.6* 14.0* 12.9* 13.3*  HGB 9.6* 10.3* 10.7* 10.2* 9.7*  HCT 29.7* 31.8* 33.7* 33.7* 31.7*  MCV 81.6 80.9 81.6 83.2 83.0  PLT 224 277 302 268 010   Basic Metabolic Panel: Recent Labs  Lab 04/24/21 0320 04/25/21 0137 04/26/21 0125 04/27/21 0157 04/28/21 0116 04/29/21 0220  NA 137 138 141 143 144 144  K 3.3* 3.6 3.9 3.6 3.5 3.8  CL 107  108 111 113* 114* 113*  CO2 23 22 21* 19* 21* 20*  GLUCOSE 111* 131* 143* 113* 109* 84  BUN 45* 37* 32* 30* 30* 28*  CREATININE 1.41* 1.24 1.39* 1.40* 1.36* 1.37*  CALCIUM 8.0* 8.3* 8.6* 8.4* 8.5* 8.3*  MG 2.1 2.1 1.9 1.7  --  1.7  PHOS  --  1.8*  --   --   --   --    GFR: Estimated Creatinine Clearance: 49.6 mL/min (A) (by C-G formula based on SCr of 1.37 mg/dL (H)). Liver Function Tests: Recent Labs  Lab 04/25/21 0137  ALBUMIN 2.2*   No results for input(s): LIPASE, AMYLASE in the last 168 hours. No results for input(s): AMMONIA in the last 168 hours. Coagulation Profile: Recent Labs  Lab 04/24/21 0320 04/25/21 0137 04/26/21 0125 04/27/21 0157 04/28/21 0116  INR 1.4* 1.3* 1.4* 1.4* 1.4*   Cardiac Enzymes: No results for input(s): CKTOTAL, CKMB, CKMBINDEX, TROPONINI in the last  168 hours.  BNP (last 3 results) Recent Labs    08/25/20 1121 03/27/21 1123  PROBNP 21,395* 3,146*   HbA1C: No results for input(s): HGBA1C in the last 72 hours. CBG: Recent Labs  Lab 04/28/21 0624 04/28/21 1236 04/28/21 1613 04/28/21 2138 04/29/21 0631  GLUCAP 105* 98 97 82 84   Lipid Profile: No results for input(s): CHOL, HDL, LDLCALC, TRIG, CHOLHDL, LDLDIRECT in the last 72 hours. Thyroid Function Tests: No results for input(s): TSH, T4TOTAL, FREET4, T3FREE, THYROIDAB in the last 72 hours. Anemia Panel: Recent Labs    04/29/21 0826  FERRITIN 995*   Sepsis Labs: No results for input(s): PROCALCITON, LATICACIDVEN in the last 168 hours.   Recent Results (from the past 240 hour(s))  Urine Culture     Status: None   Collection Time: 04/20/21 11:47 AM   Specimen: Urine, Clean Catch  Result Value Ref Range Status   Specimen Description URINE, CLEAN CATCH  Final   Special Requests NONE  Final   Culture   Final    NO GROWTH Performed at [facility], [address], [place], [place] [address]    Report Status 04/21/2021 FINAL  Final  Resp Panel by RT-PCR (Flu A&B, Covid) Nasopharyngeal Swab     Status: Abnormal   Collection Time: 04/28/21  5:44 PM   Specimen: Nasopharyngeal Swab; Nasopharyngeal(NP) swabs in vial transport medium  Result Value Ref Range Status   SARS Coronavirus 2 by RT PCR POSITIVE (A) NEGATIVE Final    Comment: RESULT CALLED TO, READ BACK BY AND VERIFIED WITHDonetta Potts RN [id number] 04/28/21 A BROWNING (NOTE) SARS-CoV-2 target nucleic acids are DETECTED.  The SARS-CoV-2 RNA is generally detectable in upper respiratory specimens during the acute phase of infection. Positive results are indicative of the presence of the identified virus, but do not rule out bacterial infection or co-infection with other pathogens not detected by the test. Clinical correlation with patient history and other diagnostic information is necessary to determine  patient infection status. The expected result is Negative.  Fact Sheet for Patients: EntrepreneurPulse.com.au  Fact Sheet for Healthcare Providers: IncredibleEmployment.be  This test is not yet approved or cleared by the [place] FDA and  has been authorized for detection and/or diagnosis of SARS-CoV-2 by FDA under an Emergency Use Authorization (EUA).  This EUA will remain in effect (meaning this test can  be used) for the duration of  the COVID-19 declaration under Section 564(b)(1) of the Act, 21 U.S.C. section 360bbb-3(b)(1), unless the authorization is terminated  or revoked sooner.     Influenza A by PCR NEGATIVE NEGATIVE Final   Influenza B by PCR NEGATIVE NEGATIVE Final    Comment: (NOTE) The Xpert Xpress SARS-CoV-2/FLU/RSV plus assay is intended as an aid in the diagnosis of influenza from Nasopharyngeal swab specimens and should not be used as a sole basis for treatment. Nasal washings and aspirates are unacceptable for Xpert Xpress SARS-CoV-2/FLU/RSV testing.  Fact Sheet for Patients: EntrepreneurPulse.com.au  Fact Sheet for Healthcare Providers: IncredibleEmployment.be  This test is not yet approved or cleared by the [place] FDA and has been authorized for detection and/or diagnosis of SARS-CoV-2 by FDA under an Emergency Use Authorization (EUA). This EUA will remain in effect (meaning this test can be used) for the duration of the COVID-19 declaration under Section 564(b)(1) of the Act, 21 U.S.C. section 360bbb-3(b)(1), unless the authorization is terminated or revoked.  Performed at [facility], [place] [address] [place], [place] [address]          Radiology Studies: No results found.      Scheduled Meds:  (feeding supplement) PROSource Plus  30 mL Oral BID BM   amiodarone  200 mg Oral Daily   vitamin C  500 mg Oral Daily   atorvastatin  40 mg Oral q1800    B-complex with vitamin C  1 tablet Oral Daily   dexamethasone  6 mg Oral Daily   docusate sodium  100 mg Oral BID   feeding supplement (NEPRO CARB STEADY)  237 mL Oral TID BM   furosemide  20 mg Oral Daily   insulin aspart  0-15 Units Subcutaneous TID WC   Ipratropium-Albuterol  1 puff Inhalation Q6H   midodrine  10 mg Oral TID WC   pantoprazole  40 mg Oral Daily   sodium chloride flush  3 mL Intravenous Q12H   zinc sulfate  220 mg Oral Daily   Continuous Infusions:  remdesivir 200 mg in sodium chloride 0.9% 250 mL IVPB 200 mg (04/29/21 1000)   Followed by   Derrill Memo ON 04/30/2021] remdesivir 100 mg in NS 100 mL        LOS: 21 days   Time spent= 35 mins    Ira Busbin Arsenio Loader, MD [facility]  If 7PM-7AM, please contact night-coverage  04/29/2021, 10:17 AM

## 2021-04-29 NOTE — Progress Notes (Addendum)
  Progress Note    04/29/2021 8:11 AM 16 Days Post-Op  Subjective:  no new complaints. No appetite.   Vitals:   04/28/21 2044 04/29/21 0014  BP: 131/65 139/74  Pulse: 81 79  Resp:  20  Temp: (!) 101.4 F (38.6 C) 99.8 F (37.7 C)  SpO2: 95% 95%   Physical Exam: Incisions:  R toe amp site necrotic; R heel macerated with odor and purulent fluid; now with ankle and pretibial wounds or R leg; L GT ulcer stable Extremities:  monophasic R PT signal Neurologic: A&O  CBC    Component Value Date/Time   WBC 13.3 (H) 04/28/2021 0116   RBC 3.82 (L) 04/28/2021 0116   HGB 9.7 (L) 04/28/2021 0116   HGB 9.6 (L) 03/27/2021 1123   HCT 31.7 (L) 04/28/2021 0116   HCT 30.4 (L) 03/27/2021 1123   PLT 299 04/28/2021 0116   PLT 362 03/27/2021 1123   MCV 83.0 04/28/2021 0116   MCV 77 (L) 03/27/2021 1123   MCH 25.4 (L) 04/28/2021 0116   MCHC 30.6 04/28/2021 0116   RDW 20.4 (H) 04/28/2021 0116   RDW 17.2 (H) 03/27/2021 1123   LYMPHSABS 1,363 06/01/2016 1414   MONOABS 517 06/01/2016 1414   EOSABS 141 06/01/2016 1414   BASOSABS 47 06/01/2016 1414    BMET    Component Value Date/Time   NA 144 04/29/2021 0220   NA 142 03/27/2021 1123   K 3.8 04/29/2021 0220   CL 113 (H) 04/29/2021 0220   CO2 20 (L) 04/29/2021 0220   GLUCOSE 84 04/29/2021 0220   BUN 28 (H) 04/29/2021 0220   BUN 17 03/27/2021 1123   CREATININE 1.37 (H) 04/29/2021 0220   CREATININE 1.17 06/01/2016 1414   CALCIUM 8.3 (L) 04/29/2021 0220   GFRNONAA 53 (L) 04/29/2021 0220   GFRAA 39 (L) 10/01/2020 1157    INR    Component Value Date/Time   INR 1.4 (H) 04/28/2021 0116     Intake/Output Summary (Last 24 hours) at 04/29/2021 0811 Last data filed at 04/29/2021 0600 Gross per 24 hour  Intake --  Output 1350 ml  Net -1350 ml     Assessment/Plan:  75 y.o. male is s/p toe amp and heel debridement  16 Days Post-Op   Non healing toe amp sites and worsening heel wound; also now with anterior ankle and tibial  wounds Discontinue wound vac Dr. [name] to evaluate the wounds later today and discuss RLE amputation vs palliative for goals of care Encouraged p.o. intake   Dagoberto Ligas, PA-C [facility] [phone] 04/29/2021 8:11 AM   VASCULAR STAFF ADDENDUM: I have independently interviewed and examined the patient. I agree with the above.  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 Debridement and skin replacement of right heel not effective. Amputated toes not healing. Only option from a vascular perspective is a right above knee amputation. Palliative care consultation may be helpful for goals of care discussion.  [name]. [name], MD [facility] Phone Number: [phone] 04/29/21

## 2021-04-29 NOTE — Procedures (Signed)
Cortrak  Person Inserting Tube:  Maylon Peppers C, RD Tube Type:  Cortrak - 43 inches Tube Size:  10 Tube Location:  Left nare Initial Placement:  Stomach Secured by: Bridle Technique Used to Measure Tube Placement:  Marking at nare/corner of mouth Cortrak Secured At:  71 cm  Cortrak Tube Team Note:  Consult received to place a Cortrak feeding tube.   X-ray is required, abdominal x-ray has been ordered by the Cortrak team. Please confirm tube placement before using the Cortrak tube.   If the tube becomes dislodged please keep the tube and contact the Cortrak team at [email or web address] ([email or web address]) for replacement.  If after hours and replacement cannot be delayed, place a NG tube and confirm placement with an abdominal x-ray.    Lockie Pares., RD, LDN, CNSC See AMiON for contact information

## 2021-04-29 NOTE — Plan of Care (Signed)
  Problem: Clinical Measurements: Goal: Ability to maintain clinical measurements within normal limits will improve Outcome: Progressing Goal: Will remain free from infection Outcome: Progressing Goal: Respiratory complications will improve Outcome: Progressing Goal: Cardiovascular complication will be avoided Outcome: Progressing   

## 2021-04-29 NOTE — TOC Progression Note (Signed)
Transition of Care [facility]) - Progression Note    Patient Details  Name: Ryan [name] MRN: [id number] Date of Birth: April 06, 1946  Transition of Care [facility]) CM/SW [place], [place] Phone Number: 04/29/2021, 2:17 PM  Clinical Narrative:     Patient tested positive for covid- unable to d/c to [facility] until 8/20. Sent referral to covid SNFs [facility] and [facility]- no availability.  CSW will continue to follow and assist with discharge planning.   Thurmond Butts, MSW, LCSW Clinical Social Worker    Expected Discharge Plan: Home w Home Health Services Barriers to Discharge: Other (must enter comment), Continued Medical Work up (Awaiting Wound Vac approval and  homehealth pending)  Expected Discharge Plan and Services Expected Discharge Plan: [place] In-house Referral: [facility] Discharge Planning Services: CM Consult Post Acute Care Choice: [place] arrangements for the past 2 months: [place]                 DME Arranged: Negative pressure wound device, Vac (Olga) DME Agency: Other - Comment, [facility] Date DME Agency Contacted: 04/15/21 Time DME Agency Contacted: 1200 Representative spoke with at DME Agency: Olivia Mackie HH Arranged: RN, PT, OT [facility] Agency: [place] Date [place]: 04/15/21 Time [place]: 4 Representative spoke with at [place]: [place] ([name]) Interventions    Readmission Risk Interventions Readmission Risk Prevention Plan 04/15/2021  Home Care Screening Complete  Medication Review (RN CM) Complete  Some recent data might be hidden

## 2021-04-30 DIAGNOSIS — I70229 Atherosclerosis of native arteries of extremities with rest pain, unspecified extremity: Secondary | ICD-10-CM | POA: Diagnosis not present

## 2021-04-30 LAB — GLUCOSE, CAPILLARY
Glucose-Capillary: 124 mg/dL — ABNORMAL HIGH (ref 70–99)
Glucose-Capillary: 118 mg/dL — ABNORMAL HIGH (ref 70–99)
Glucose-Capillary: 143 mg/dL — ABNORMAL HIGH (ref 70–99)
Glucose-Capillary: 190 mg/dL — ABNORMAL HIGH (ref 70–99)
Glucose-Capillary: 147 mg/dL — ABNORMAL HIGH (ref 70–99)

## 2021-04-30 LAB — CBC
HCT: 30.1 % — ABNORMAL LOW (ref 39.0–52.0)
Hemoglobin: 9.2 g/dL — ABNORMAL LOW (ref 13.0–17.0)
MCH: 25.1 pg — ABNORMAL LOW (ref 26.0–34.0)
MCHC: 30.6 g/dL (ref 30.0–36.0)
MCV: 82.2 fL (ref 80.0–100.0)
Platelets: 252 10*3/uL (ref 150–400)
RBC: 3.66 MIL/uL — ABNORMAL LOW (ref 4.22–5.81)
RDW: 21.1 % — ABNORMAL HIGH (ref 11.5–15.5)
WBC: 12.5 10*3/uL — ABNORMAL HIGH (ref 4.0–10.5)
nRBC: 0 % (ref 0.0–0.2)

## 2021-04-30 LAB — BASIC METABOLIC PANEL WITH GFR
Anion gap: 11 (ref 5–15)
BUN: 35 mg/dL — ABNORMAL HIGH (ref 8–23)
CO2: 20 mmol/L — ABNORMAL LOW (ref 22–32)
Calcium: 8.4 mg/dL — ABNORMAL LOW (ref 8.9–10.3)
Chloride: 114 mmol/L — ABNORMAL HIGH (ref 98–111)
Creatinine, Ser: 1.22 mg/dL (ref 0.61–1.24)
GFR, Estimated: >60 mL/min
Glucose, Bld: 142 mg/dL — ABNORMAL HIGH (ref 70–99)
Potassium: 3.8 mmol/L (ref 3.5–5.1)
Sodium: 145 mmol/L (ref 135–145)

## 2021-04-30 LAB — PHOSPHORUS
Phosphorus: 3.1 mg/dL (ref 2.5–4.6)
Phosphorus: 3.3 mg/dL (ref 2.5–4.6)

## 2021-04-30 LAB — MAGNESIUM: Magnesium: 1.9 mg/dL (ref 1.7–2.4)

## 2021-04-30 LAB — FERRITIN: Ferritin: 961 ng/mL — ABNORMAL HIGH (ref 24–336)

## 2021-04-30 LAB — C-REACTIVE PROTEIN: CRP: 27 mg/dL — ABNORMAL HIGH (ref <1.0)

## 2021-04-30 LAB — D-DIMER, QUANTITATIVE: D-Dimer, Quant: >20 ug{FEU}/mL — ABNORMAL HIGH (ref 0.00–0.50)

## 2021-04-30 MED ORDER — BIOTENE DRY MOUTH MT LIQD: 15.0000 mL | OROMUCOSAL | Status: DC | PRN

## 2021-04-30 NOTE — Progress Notes (Signed)
Physical Therapy Treatment Patient Details Name: Ryan [name] MRN: [id number] DOB: [birth year] Today's Date: 04/30/2021    History of Present Illness Pt 75 y.o. 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 Scheduled to have debridement on 7/20 but due to an INR of 3.2 surgery was postponed. He eventually underwent partial foot/toe amputation with excisional debridement and wound VAC placement on 7/21, left lower extremity angiogram in 7/25, and R IJ temp HD catheter placement on 8/1.  Pt with L sided hydronephrosis possibly secondary to L retroperitoneal hematoma, s/p nephrostomy tube on 8/1 to drain. Now on airborne/contact precautions, positive Covid-19 test collected 8/9. 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    PT Comments    Pt received in supine, agreeable to therapy session and with good participation and tolerance for seated balance/exercise activity and slide board transfer training. Emphasis on use of slide board, activity pacing/pursed-lip breathing, importance of frequent pressure relief/repositioning and use of flutter valve for improved pulmonary clearance. Pt continues to benefit from PT services to progress toward functional mobility goals. RN aware pt up in chair recommend he not sit up more than 1-2 hours at a time due to chronic back pain and chair alarm on for safety with green light activated and lift pad in chair for ease of return to bed.   Follow Up Recommendations  SNF;Supervision for mobility/OOB     Equipment Recommendations  Other (comment) (defer to next venue)    Recommendations for Other Services       Precautions / Restrictions Precautions Precautions: Fall;Other (comment) Precaution Comments: Airborne/Contact precs, L nephrostomy tube/drain, cortrak Restrictions Weight Bearing  Restrictions: Yes RLE Weight Bearing: Non weight bearing    Mobility  Bed Mobility Overal bed mobility: Needs Assistance Bed Mobility: Rolling;Sidelying to Sit Rolling: Mod assist;+2 for physical assistance Sidelying to sit: Mod assist;+2 for physical assistance;HOB elevated       General bed mobility comments: increased time/effort and heavy cueing with hand over hand assist to reach cross-body toward opposite rail    Transfers Overall transfer level: Needs assistance Equipment used: Sliding board Transfers: Lateral/Scoot Transfers          Lateral/Scoot Transfers: Max assist;+2 physical assistance;With slide board General transfer comment: pt needing increased time and max cues for sequencing lateral scoot along EOB, pt initially needed totalA +2 on air bed to scoot but once slide board placed, pt able to perform small propulsion scoots to chair with +79mxA and multimodal cues. he needed assist to keep RLE elevated/utilized pillow under leg to prevent WB on this limb       Balance Overall balance assessment: Needs assistance Sitting-balance support: No upper extremity supported Sitting balance-Leahy Scale: Fair Sitting balance - Comments: Supervision for seated balance, pt reporting dizziness initially upon sitting and briefly given min guard for safety but he reports this resolved within 2 minutes and BP stable          Cognition Arousal/Alertness: Awake/alert Behavior During Therapy: Flat affect Overall Cognitive Status: No family/caregiver present to determine baseline cognitive functioning Area of Impairment: Following commands;Safety/judgement;Problem solving                       Following Commands: Follows one step commands with increased time Safety/Judgement: Decreased awareness of deficits   Problem Solving: Slow processing;Decreased initiation;Difficulty sequencing;Requires verbal cues;Requires tactile  cues General Comments: Followed basic  commands with increased time, pt having difficulty following sequencing instructions for lateral scooting but improves as session progressed      Exercises Other Exercises Other Exercises: Seated BLE AAROM: LAQ x10 reps; seated LLE AROM: ankle pumps x10 reps Other Exercises: bicep/tricep preses bil x10 reps with graded resistance.    General Comments General comments (skin integrity, edema, etc.): BP 110/72 (83); SpO2 desat to 86% upon transition to EOB but improves to 93% within 2 minutes with cues for posture and pursed-lip breathing on 1L O2 [place]; pt SpO2 91% resting in chair on 1L/min via [place], RN notified.      Pertinent Vitals/Pain Pain Assessment: Faces Faces Pain Scale: Hurts little more Pain Location: RLE>LLE Pain Descriptors / Indicators: Discomfort;Grimacing;Guarding;Sore Pain Intervention(s): Limited activity within patient's tolerance;Monitored during session;Repositioned     PT Goals (current goals can now be found in the care plan section) Acute Rehab PT Goals Patient Stated Goal: to get OOB PT Goal Formulation: With patient/family Progress towards PT goals: Progressing toward goals    Frequency    Min 2X/week      PT Plan Current plan remains appropriate       AM-PAC PT "6 Clicks" Mobility   Outcome Measure  Help needed turning from your back to your side while in a flat bed without using bedrails?: A Lot Help needed moving from lying on your back to sitting on the side of a flat bed without using bedrails?: A Lot Help needed moving to and from a bed to a chair (including a wheelchair)?: Total Help needed standing up from a chair using your arms (e.g., wheelchair or bedside chair)?: Total Help needed to walk in hospital room?: Total Help needed climbing 3-5 steps with a railing? : Total 6 Click Score: 8    End of Session Equipment Utilized During Treatment: Oxygen Activity Tolerance: Patient tolerated treatment well;Patient limited by fatigue Patient  left: in chair;with call bell/phone within reach;with chair alarm set;Other (comment) (pt heels floated, lift pad under him with transfer pad under hips above lift pad to prevent skin breakdown) Nurse Communication: Mobility status;Need for lift equipment (hoyer pad in chair; pt may not want to sit longer than 1 hour) PT Visit Diagnosis: Unsteadiness on feet (R26.81);Other abnormalities of gait and mobility (R26.89);Muscle weakness (generalized) (M62.81);Pain Pain - Right/Left: Right Pain - part of body: Ankle and joints of foot;Leg     Time: 1222-1300 PT Time Calculation (min) (ACUTE ONLY): 38 min  Charges:  $Therapeutic Exercise: 8-22 mins $Therapeutic Activity: 23-37 mins                     Solenne Manwarren P., PTA Acute Rehabilitation Services Pager: [phone] Office: [place] 04/30/2021, 1:51 PM

## 2021-04-30 NOTE — Progress Notes (Signed)
PROGRESS NOTE    Ryan [name]   PHX:[id number]  DOB: 13-Apr-1946  PCP: [name], MD    DOA: 04/08/2021 LOS: 22   Assessment & Plan   Principal Problem:   Critical lower limb ischemia ([place]) Active Problems:   Chronic systolic heart failure (HCC)   Status post mitral valve replacement with bioprosthetic valve   Diabetes mellitus ([place])   Essential hypertension   ICD (implantable cardioverter-defibrillator) in place   Stage 3 chronic kidney disease ([place])   Pressure injury of skin   Multifocal pneumonia due to COVID-19  - Chest x-ray showed some opacity initially thought to be fluid or atelectasis but, now Covid+ so more likely infection especially given his fever and cough --Continue supplemental oxygen, keep sats >90% - Continue remdesivir, steroids -- I-S, flutter, bronchodilator/inhaler - Follow inflammatory markers and CMP   Right critical lower limb ischemia [facility]): Vascular Surgery-recommends dressing changes. Wound care team consulted Lower extremity Dopplers negative for DVT.   Surgical debridement of the right heel with application of myriad substitute 8/8.   Vascular surgery, Dr. Luan Pulling recommends routine dressing changes.  Outpatient follow-up in 1-2 weeks will be arranged by their service.   Retroperitoneal hematoma Acute blood loss anemia: -CT scan of the abdomen and pelvis showed new retroperitoneal hematoma he was given Vit K & FFP. Repeat CT A/P on 8/5- remains large unchanged except increase in Pleural effusion vs atelactesis.    AKI from ATN on chronic kidney disease stage IIIb: Creatinine peaked at 5.63; Improving.  -Secondary to hypotension and retroperitoneal bleed displacing left kidney - Status post hemodialysis on 8/2  --HD catheter has been removed - Seen by nephro, urology and IR.   -- Follow-up urology outpatient as necessary.   Dr. Reesa Chew spoke with IR, Dr. Earleen Newport 8/8 - continue PCN at least for 2 months, follow-up will be arranged by their  team.  Recommend routine drainage of catheter/bag and dry dressing changes.  Chronic systolic heart failure ([place]), EF 40-45% -On Midodrine $RemoveBefor'10mg'QyFCBctaCAPd$  po tid.    Echo 03/04/2021-EF 40-45%, global hypokinesis.   Follow-up outpatient CHF.   On Lasix 20 mg daily  Monitor volume status    Permanent atrial fibrillation/Status post mitral valve replacement with bioprosthetic valve: Bioprosthetic valve. Anticoagulation on hold.  Amiodarone $RemoveBef'200mg'bXRfEyodde$  daily    Diabetes mellitus type 2: Acceptable range.  -Hemoglobin A1c 5.4.  Insulin sliding scale and Accu-Cheks  Hx of Essential HTN but currently borderline low BP -midodrine 10 mg TID  Hyperlipidemia: -Lipitor 40 mg daily   Ethics/goals of care: Seen by Palliative Care    Patient BMI: Body mass index is 26.04 kg/m.   DVT prophylaxis:    Diet:  Diet Orders (From admission, onward)     Start     Ordered   04/29/21 1436  Diet regular Room service appropriate? Yes; Fluid consistency: Thin; Fluid restriction: 1800 mL Fluid  Diet effective now       Question Answer Comment  Room service appropriate? Yes   Fluid consistency: Thin   Fluid restriction: 1800 mL Fluid      04/29/21 1437              Code Status: Full Code   Brief Narrative / Hospital Course to Date:   "75 y.o. 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  Scheduled to have debridement on 04/08/2021 but due to an INR of 3.2 surgery was postponed.  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  He has completed his course of antibiotics in house last day was 04/13/2019.  He developed a  retroperitoneal hematoma with left kidney displacement and hypotension developed ATN and hyperkalemia, nephrology was consulted recommended renal replacement therapy, urology was consulted due to his left hydronephrosis would like recommended a left nephrostomy tube.  Nephrostomy tube placed on 8/1 and central line placement for HD.  Patient did well after 1 HD treatment and renal function recovered.  HD catheter removed.  PT recommended SNF.  Cleared by vascular, 1-2-week follow improvement be arranged by their service.  During screening status for SNF placement patient tested positive for COVID-19"  Subjective 04/30/21    Pt had fever 100.7 around 4 AM this morning.  He reports he continues to feel hot at this time.  Reports cough with some phlegm production.  He asks about the core track NG tube.  Reports he is eating but agrees may not be eating enough.  Denies abdominal pain nausea or vomiting.   Disposition Plan & Communication   Status is: Inpatient  Remains inpatient appropriate because: COVID-positive with fevers and cough; on 10-day isolation prior to SNF discharge  Dispo: The patient is from: Home              Anticipated d/c is to: SNF              Patient currently is not medically stable to d/c.   Difficult to place patient No   Consults, Procedures, Significant Events   Consultants:  Palliative care Vascular surgery Nephrology  Procedures:  As per above narrative  Antimicrobials:  Anti-infectives (From admission, onward)    Start     Dose/Rate Route Frequency Ordered Stop   04/30/21 1000  remdesivir 100 mg in sodium chloride 0.9 % 100 mL IVPB       See Hyperspace for full Linked Orders Report.   100 mg 200 mL/hr over 30 Minutes Intravenous Daily 04/29/21 0732 05/04/21 0959   04/29/21 0900  remdesivir 200 mg in sodium chloride 0.9% 250 mL IVPB       See Hyperspace for full Linked Orders Report.   200 mg 580 mL/hr over 30 Minutes Intravenous Once 04/29/21 0732  04/29/21 1030   04/21/21 0800  cefTRIAXone (ROCEPHIN) 1 g in sodium chloride 0.9 % 100 mL IVPB  Status:  Discontinued        1 g 200 mL/hr over 30 Minutes Intravenous Every 24 hours 04/20/21 1147 04/27/21 1354   04/20/21 1200  cefTRIAXone (ROCEPHIN) 2 g in sodium chloride 0.9 % 100 mL IVPB        2 g 200 mL/hr over 30 Minutes Intravenous To Radiology 04/20/21 1107 04/20/21 1254   04/08/21 2000  cefTRIAXone (ROCEPHIN) 2 g in sodium chloride 0.9 % 100 mL IVPB  Status:  Discontinued       See Hyperspace for full Linked Orders Report.   2 g 200 mL/hr over 30 Minutes Intravenous Every 24 hours 04/08/21 1212 04/13/21 1134   04/08/21 2000  metroNIDAZOLE (FLAGYL) IVPB 500 mg  Status:  Discontinued       See Hyperspace for full Linked Orders Report.   500 mg 100 mL/hr over 60 Minutes Intravenous Every 8 hours 04/08/21  1212 04/13/21 1134   04/08/21 0823  ceFAZolin (ANCEF) IVPB 2g/100 mL premix  Status:  Discontinued        2 g 200 mL/hr over 30 Minutes Intravenous 30 min pre-op 04/08/21 0823 04/08/21 1212         Micro    Objective   Vitals:   04/30/21 0421 04/30/21 0914 04/30/21 1234 04/30/21 1713  BP: 125/62 120/60 110/72 130/62  Pulse: 71 77 73 71  Resp: $Remo'16 18 18 18  'pVdaf$ Temp: (!) 100.7 F (38.2 C) 100 F (37.8 C) 98.5 F (36.9 C) 98.6 F (37 C)  TempSrc: Axillary Oral Oral Oral  SpO2: 94% 93%  95%  Weight:      Height:        Intake/Output Summary (Last 24 hours) at 04/30/2021 1856 Last data filed at 04/30/2021 1431 Gross per 24 hour  Intake 115.42 ml  Output 950 ml  Net -834.58 ml   Filed Weights   04/24/21 0552 04/25/21 0355 04/28/21 0500  Weight: 90.3 kg 88.9 kg 87.1 kg    Physical Exam:  General exam: awake, alert, no acute distress HEENT: CorTrak in place, nasal cannula in place moist mucus membranes, hearing grossly normal  Respiratory system: Coarse breath sounds due to referred upper airway secretion sounds, diminished bases, normal respiratory effort, on 2  L/min supplemental oxygen. Cardiovascular system: normal S1/S2, RRR, 2+ radial pulses bilaterally, bilateral upper extremity edema, 1+ left lower extremity edema.   Gastrointestinal system: soft, NT, ND Central nervous system: A&O x3. no gross focal neurologic deficits, normal speech Skin: dry, intact, warm to touch Psychiatry: normal mood, congruent affect, judgement and insight appear normal  Labs   Data Reviewed: I have personally reviewed following labs and imaging studies  CBC: Recent Labs  Lab 04/25/21 0137 04/26/21 0125 04/27/21 0157 04/28/21 0116 04/30/21 0645  WBC 12.6* 14.0* 12.9* 13.3* 12.5*  HGB 10.3* 10.7* 10.2* 9.7* 9.2*  HCT 31.8* 33.7* 33.7* 31.7* 30.1*  MCV 80.9 81.6 83.2 83.0 82.2  PLT 277 302 268 299 614   Basic Metabolic Panel: Recent Labs  Lab 04/25/21 0137 04/26/21 0125 04/27/21 0157 04/28/21 0116 04/29/21 0220 04/29/21 1739 04/30/21 0645  NA 138 141 143 144 144  --  145  K 3.6 3.9 3.6 3.5 3.8  --  3.8  CL 108 111 113* 114* 113*  --  114*  CO2 22 21* 19* 21* 20*  --  20*  GLUCOSE 131* 143* 113* 109* 84  --  142*  BUN 37* 32* 30* 30* 28*  --  35*  CREATININE 1.24 1.39* 1.40* 1.36* 1.37*  --  1.22  CALCIUM 8.3* 8.6* 8.4* 8.5* 8.3*  --  8.4*  MG 2.1 1.9 1.7  --  1.7  --  1.9  PHOS 1.8*  --   --   --   --  3.1 3.1   GFR: Estimated Creatinine Clearance: 55.7 mL/min (by C-G formula based on SCr of 1.22 mg/dL). Liver Function Tests: Recent Labs  Lab 04/25/21 0137  ALBUMIN 2.2*   No results for input(s): LIPASE, AMYLASE in the last 168 hours. No results for input(s): AMMONIA in the last 168 hours. Coagulation Profile: Recent Labs  Lab 04/24/21 0320 04/25/21 0137 04/26/21 0125 04/27/21 0157 04/28/21 0116  INR 1.4* 1.3* 1.4* 1.4* 1.4*   Cardiac Enzymes: No results for input(s): CKTOTAL, CKMB, CKMBINDEX, TROPONINI in the last 168 hours. BNP (last 3 results) Recent Labs    08/25/20 1121 03/27/21 1123  PROBNP 21,395* 3,146*  HbA1C: No results for input(s): HGBA1C in the last 72 hours. CBG: Recent Labs  Lab 04/29/21 1535 04/30/21 0419 04/30/21 0911 04/30/21 1228 04/30/21 1709  GLUCAP 112* 124* 118* 143* 190*   Lipid Profile: No results for input(s): CHOL, HDL, LDLCALC, TRIG, CHOLHDL, LDLDIRECT in the last 72 hours. Thyroid Function Tests: No results for input(s): TSH, T4TOTAL, FREET4, T3FREE, THYROIDAB in the last 72 hours. Anemia Panel: Recent Labs    04/29/21 0826 04/30/21 0645  FERRITIN 995* 961*   Sepsis Labs: Recent Labs  Lab 04/29/21 0826  PROCALCITON 0.53    Recent Results (from the past 240 hour(s))  Resp Panel by RT-PCR (Flu A&B, Covid) Nasopharyngeal Swab     Status: Abnormal   Collection Time: 04/28/21  5:44 PM   Specimen: Nasopharyngeal Swab; Nasopharyngeal(NP) swabs in vial transport medium  Result Value Ref Range Status   SARS Coronavirus 2 by RT PCR POSITIVE (A) NEGATIVE Final    Comment: RESULT CALLED TO, READ BACK BY AND VERIFIED WITHDonetta Potts RN 0350 04/28/21 A BROWNING (NOTE) SARS-CoV-2 target nucleic acids are DETECTED.  The SARS-CoV-2 RNA is generally detectable in upper respiratory specimens during the acute phase of infection. Positive results are indicative of the presence of the identified virus, but do not rule out bacterial infection or co-infection with other pathogens not detected by the test. Clinical correlation with patient history and other diagnostic information is necessary to determine patient infection status. The expected result is Negative.  Fact Sheet for Patients: EntrepreneurPulse.com.au  Fact Sheet for Healthcare Providers: IncredibleEmployment.be  This test is not yet approved or cleared by the [place] FDA and  has been authorized for detection and/or diagnosis of SARS-CoV-2 by FDA under an Emergency Use Authorization (EUA).  This EUA will remain in effect (meaning this test can  be used) for  the duration of  the COVID-19 declaration under Section 564(b)(1) of the Act, 21 U.S.C. section 360bbb-3(b)(1), unless the authorization is terminated or revoked sooner.     Influenza A by PCR NEGATIVE NEGATIVE Final   Influenza B by PCR NEGATIVE NEGATIVE Final    Comment: (NOTE) The Xpert Xpress SARS-CoV-2/FLU/RSV plus assay is intended as an aid in the diagnosis of influenza from Nasopharyngeal swab specimens and should not be used as a sole basis for treatment. Nasal washings and aspirates are unacceptable for Xpert Xpress SARS-CoV-2/FLU/RSV testing.  Fact Sheet for Patients: EntrepreneurPulse.com.au  Fact Sheet for Healthcare Providers: IncredibleEmployment.be  This test is not yet approved or cleared by the [place] FDA and has been authorized for detection and/or diagnosis of SARS-CoV-2 by FDA under an Emergency Use Authorization (EUA). This EUA will remain in effect (meaning this test can be used) for the duration of the COVID-19 declaration under Section 564(b)(1) of the Act, 21 U.S.C. section 360bbb-3(b)(1), unless the authorization is terminated or revoked.  Performed at [facility], [place] [address], [place], [place] [address]       Imaging Studies   DG Abd Portable 1V  Result Date: 04/29/2021 CLINICAL DATA:  Feeding tube EXAM: PORTABLE ABDOMEN - 1 VIEW COMPARISON:  09/18/2012, CT 04/24/2021 FINDINGS: Esophageal tube tip overlies the gastroduodenal region. Left-sided percutaneous nephrostomy tube. Mild diffuse increased bowel gas without obstructive change. Airspace disease at left base. IMPRESSION: Esophageal tube tip overlies the distal stomach/gastroduodenal region Electronically Signed   By: Donavan Foil M.D.   On: 04/29/2021 19:20     Medications   Scheduled Meds:  amiodarone  200 mg Oral Daily  vitamin C  500 mg Oral Daily   atorvastatin  40 mg Oral q1800   dexamethasone  6 mg Oral Daily   docusate sodium   100 mg Oral BID   feeding supplement  237 mL Oral BID BM   feeding supplement (PROSource TF)  45 mL Per Tube TID   furosemide  20 mg Oral Daily   insulin aspart  0-15 Units Subcutaneous TID WC   Ipratropium-Albuterol  1 puff Inhalation Q6H   midodrine  10 mg Oral TID WC   multivitamin with minerals  1 tablet Oral Daily   pantoprazole  40 mg Oral Daily   sodium chloride flush  3 mL Intravenous Q12H   zinc sulfate  220 mg Oral Daily   Continuous Infusions:  feeding supplement (OSMOLITE 1.5 CAL) 1,000 mL (04/30/21 1431)   remdesivir 100 mg in NS 100 mL 100 mg (04/30/21 0934)       LOS: 22 days    Time spent: 30 minutes    Ezekiel Slocumb, DO [facility]  04/30/2021, 6:56 PM      If 7PM-7AM, please contact night-coverage. How to contact the [facility] Attending or Consulting provider [place] or covering provider during after hours [place], for this patient?    Check the care team in [facility] and look for a) attending/consulting [facility] provider listed and b) the [facility] team listed Log into [email or web address] and use [place]'s universal password to access. If you do not have the password, please contact the hospital operator. Locate the [facility] provider you are looking for under [facility] and page to a number that you can be directly reached. If you still have difficulty reaching the provider, please page the [facility] (Director on Call) for the Hospitalists listed on amion for assistance.

## 2021-04-30 NOTE — Progress Notes (Signed)
Mobility Specialist: Progress Note   04/30/21 1305  Mobility  Activity Transferred:  Bed to chair  Level of Assistance +2 (takes two people) (ModA and line management)  Assistive Device Sliding board  Mobility Out of bed to chair with meals  Mobility Response Tolerated well  Mobility performed by Mobility specialist;Other (comment) (PTA [place])  Bed Position Chair  $Mobility charge 1 Mobility   Pt modA to sit EOB from supine as well as to scoot to the recliner using the sliding board. Pt required verbal cues for hand placement and direction as well as physical assistance with pad to slide to the chair. Pt c/o feeling light headed upon sitting up but progressed better throughout session as well. Pt has call bell and phone in his lap. Chair alarm is on.   [facility] Jinger Middlesworth Mobility Specialist Mobility Specialist Phone: [phone]

## 2021-04-30 NOTE — Progress Notes (Signed)
Mobility Specialist: Progress Note   04/30/21 1849  Mobility  Activity Transferred:  Chair to bed  Level of Assistance +2 (takes two people)  Assistive Device Sliding board  Mobility Out of bed to chair with meals  Mobility Response Tolerated well  Mobility performed by Mobility specialist  $Mobility charge 1 Mobility   Pt assisted back to bed by RN and myself per RN request. RN present in the room.   [facility] Keishawna Carranza Mobility Specialist Mobility Specialist Phone: [phone]

## 2021-05-01 DIAGNOSIS — I70229 Atherosclerosis of native arteries of extremities with rest pain, unspecified extremity: Secondary | ICD-10-CM | POA: Diagnosis not present

## 2021-05-01 LAB — COMPREHENSIVE METABOLIC PANEL
ALT: 42 U/L (ref 0–44)
AST: 54 U/L — ABNORMAL HIGH (ref 15–41)
Albumin: 2 g/dL — ABNORMAL LOW (ref 3.5–5.0)
Alkaline Phosphatase: 92 U/L (ref 38–126)
Anion gap: 8 (ref 5–15)
BUN: 46 mg/dL — ABNORMAL HIGH (ref 8–23)
CO2: 20 mmol/L — ABNORMAL LOW (ref 22–32)
Calcium: 8.4 mg/dL — ABNORMAL LOW (ref 8.9–10.3)
Chloride: 114 mmol/L — ABNORMAL HIGH (ref 98–111)
Creatinine, Ser: 1.43 mg/dL — ABNORMAL HIGH (ref 0.61–1.24)
GFR, Estimated: 50 mL/min — ABNORMAL LOW (ref >60)
Glucose, Bld: 199 mg/dL — ABNORMAL HIGH (ref 70–99)
Potassium: 3.8 mmol/L (ref 3.5–5.1)
Sodium: 142 mmol/L (ref 135–145)
Total Bilirubin: 1.2 mg/dL (ref 0.3–1.2)
Total Protein: 6 g/dL — ABNORMAL LOW (ref 6.5–8.1)

## 2021-05-01 LAB — CBC
HCT: 31.1 % — ABNORMAL LOW (ref 39.0–52.0)
Hemoglobin: 9.7 g/dL — ABNORMAL LOW (ref 13.0–17.0)
MCH: 25.3 pg — ABNORMAL LOW (ref 26.0–34.0)
MCHC: 31.2 g/dL (ref 30.0–36.0)
MCV: 81 fL (ref 80.0–100.0)
Platelets: 261 10*3/uL (ref 150–400)
RBC: 3.84 MIL/uL — ABNORMAL LOW (ref 4.22–5.81)
RDW: 20.9 % — ABNORMAL HIGH (ref 11.5–15.5)
WBC: 15.9 10*3/uL — ABNORMAL HIGH (ref 4.0–10.5)
nRBC: 0 % (ref 0.0–0.2)

## 2021-05-01 LAB — GLUCOSE, CAPILLARY
Glucose-Capillary: 123 mg/dL — ABNORMAL HIGH (ref 70–99)
Glucose-Capillary: 174 mg/dL — ABNORMAL HIGH (ref 70–99)
Glucose-Capillary: 180 mg/dL — ABNORMAL HIGH (ref 70–99)
Glucose-Capillary: 204 mg/dL — ABNORMAL HIGH (ref 70–99)
Glucose-Capillary: 218 mg/dL — ABNORMAL HIGH (ref 70–99)
Glucose-Capillary: 259 mg/dL — ABNORMAL HIGH (ref 70–99)

## 2021-05-01 LAB — MAGNESIUM: Magnesium: 1.8 mg/dL (ref 1.7–2.4)

## 2021-05-01 LAB — PROTIME-INR
INR: 1.4 — ABNORMAL HIGH (ref 0.8–1.2)
Prothrombin Time: 17.4 seconds — ABNORMAL HIGH (ref 11.4–15.2)

## 2021-05-01 LAB — FERRITIN: Ferritin: 1167 ng/mL — ABNORMAL HIGH (ref 24–336)

## 2021-05-01 LAB — D-DIMER, QUANTITATIVE: D-Dimer, Quant: >20 ug/mL-FEU — ABNORMAL HIGH (ref 0.00–0.50)

## 2021-05-01 LAB — C-REACTIVE PROTEIN: CRP: 25.8 mg/dL — ABNORMAL HIGH (ref <1.0)

## 2021-05-01 NOTE — Progress Notes (Signed)
PROGRESS NOTE    Ryan [name]   ENM:[id number]  DOB: 1946-06-27  PCP: Ryan Nakai, MD    DOA: 04/08/2021 LOS: 23   Assessment & Plan   Principal Problem:   Critical lower limb ischemia ([place]) Active Problems:   Chronic systolic heart failure (HCC)   Status post mitral valve replacement with bioprosthetic valve   Diabetes mellitus ([place])   Essential hypertension   ICD (implantable cardioverter-defibrillator) in place   Stage 3 chronic kidney disease ([place])   Pressure injury of skin   Multifocal pneumonia due to COVID-19  - Chest x-ray showed some opacity initially thought to be fluid or atelectasis but, now Covid+ so more likely infection especially given his fever and cough --Continue supplemental oxygen, keep sats >90% - Continue remdesivir, steroids -- I-S, flutter, bronchodilator/inhaler - Follow inflammatory markers and CMP  Poor p.o. intake -dietitian is following.  CorTrak in place. --Continue tube feeds to supplement nutrition --Encourage p.o. intake Body mass index is 26.04 kg/m.  Leukocytosis  -most likely due to steroids being given for COVID infection with associated hypoxia.  Monitor CBC   Right critical lower limb ischemia [facility]): Vascular Surgery-recommends dressing changes. Wound care team consulted Lower extremity Dopplers negative for DVT.   Surgical debridement of the right heel with application of myriad substitute 8/8.   Vascular surgery, Dr. Luan Pulling recommends routine dressing changes.  Outpatient follow-up in 1-2 weeks will be arranged by their service.   Retroperitoneal hematoma Acute blood loss anemia: -CT scan of the abdomen and pelvis showed new retroperitoneal hematoma he was given Vit K & FFP. Repeat CT A/P on 8/5- remains large unchanged except increase in Pleural effusion vs atelactesis.    AKI from ATN on chronic kidney disease stage IIIb: Creatinine peaked at 5.63; Improving.  -Secondary to hypotension and retroperitoneal bleed  displacing left kidney - Status post hemodialysis on 8/2  --HD catheter has been removed - Seen by nephro, urology and IR.   -- Follow-up urology outpatient as necessary.   Dr. Reesa Chew spoke with IR, Dr. Earleen Newport 8/8 - continue PCN at least for 2 months, follow-up will be arranged by their team.  Recommend routine drainage of catheter/bag and dry dressing changes.  Chronic systolic heart failure ([place]), EF 40-45% -On Midodrine $RemoveBefor'10mg'GoAEAVLxMWsy$  po tid.    Echo 03/04/2021-EF 40-45%, global hypokinesis.   Follow-up outpatient CHF.   On Lasix 20 mg daily  Monitor volume status    Permanent atrial fibrillation/Status post mitral valve replacement with bioprosthetic valve: Bioprosthetic valve. Anticoagulation on hold.  Amiodarone $RemoveBef'200mg'kguWaBWWJJ$  daily    Diabetes mellitus type 2: Acceptable range.  -Hemoglobin A1c 5.4.  Insulin sliding scale and Accu-Cheks  Hx of Essential HTN but currently borderline low BP -midodrine 10 mg TID  Hyperlipidemia: -Lipitor 40 mg daily   Ethics/goals of care: Seen by Palliative Care    Patient BMI: Body mass index is 26.04 kg/m.   DVT prophylaxis:    Diet:  Diet Orders (From admission, onward)     Start     Ordered   04/29/21 1436  Diet regular Room service appropriate? Yes; Fluid consistency: Thin; Fluid restriction: 1800 mL Fluid  Diet effective now       Question Answer Comment  Room service appropriate? Yes   Fluid consistency: Thin   Fluid restriction: 1800 mL Fluid      04/29/21 1437              Code Status: Full Code   Brief Narrative / Hospital  Course to Date:   "75 y.o. 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  Scheduled to have debridement on 04/08/2021 but due to an  INR of 3.2 surgery was postponed.  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  He has completed his course of antibiotics in house last day was 04/13/2019.  He developed a retroperitoneal hematoma with left kidney displacement and hypotension developed ATN and hyperkalemia, nephrology was consulted recommended renal replacement therapy, urology was consulted due to his left hydronephrosis would like recommended a left nephrostomy tube.  Nephrostomy tube placed on 8/1 and central line placement for HD.  Patient did well after 1 HD treatment and renal function recovered.  HD catheter removed.  PT recommended SNF.  Cleared by vascular, 1-2-week follow improvement be arranged by their service.  During screening status for SNF placement patient tested positive for COVID-19"  Subjective 05/01/21    Pt has remained afebrile since the fever he had early a.m. yesterday.  He reports tolerating tube feeds.  Spoke to his son on patient's cell phone.  He reports that prior to the tube feeds patient was tried on supplemental drinks in addition to his diet and was still not taking in very much at all.  Patient agreeable to keep the core track in place and on tube feeds for now to supplement his nutrition for his foot to heal properly.  He denies fevers or chills.  Endorses cough with some phlegm production but no other acute complaints.  He says overall is feeling a little better today.   Disposition Plan & Communication   Status is: Inpatient  Remains inpatient appropriate because: COVID-positive with fevers and cough; on 10-day isolation prior to SNF discharge  Dispo: The patient is from: Home              Anticipated d/c is to: SNF              Patient currently is not medically stable to d/c.   Difficult to place patient No  Family communication - spoke with patient's son on patient's cell phone  during encounter today (8/12)   Consults, Procedures, Significant Events   Consultants:  Palliative care Vascular surgery Nephrology  Procedures:  As per above narrative  Antimicrobials:  Anti-infectives (From admission, onward)    Start     Dose/Rate Route Frequency Ordered Stop   04/30/21 1000  remdesivir 100 mg in sodium chloride 0.9 % 100 mL IVPB       See Hyperspace for full Linked Orders Report.   100 mg 200 mL/hr over 30 Minutes Intravenous Daily 04/29/21 0732 05/04/21 0959   04/29/21 0900  remdesivir 200 mg in sodium chloride 0.9% 250 mL IVPB       See Hyperspace for full Linked Orders Report.   200 mg 580 mL/hr over 30 Minutes Intravenous Once 04/29/21 0732 04/29/21 1030   04/21/21 0800  cefTRIAXone (ROCEPHIN) 1 g in sodium chloride 0.9 % 100 mL IVPB  Status:  Discontinued        1 g 200 mL/hr over 30 Minutes Intravenous Every 24 hours 04/20/21 1147 04/27/21 1354   04/20/21 1200  cefTRIAXone (ROCEPHIN) 2 g in sodium chloride 0.9 %  100 mL IVPB        2 g 200 mL/hr over 30 Minutes Intravenous To Radiology 04/20/21 1107 04/20/21 1254   04/08/21 2000  cefTRIAXone (ROCEPHIN) 2 g in sodium chloride 0.9 % 100 mL IVPB  Status:  Discontinued       See Hyperspace for full Linked Orders Report.   2 g 200 mL/hr over 30 Minutes Intravenous Every 24 hours 04/08/21 1212 04/13/21 1134   04/08/21 2000  metroNIDAZOLE (FLAGYL) IVPB 500 mg  Status:  Discontinued       See Hyperspace for full Linked Orders Report.   500 mg 100 mL/hr over 60 Minutes Intravenous Every 8 hours 04/08/21 1212 04/13/21 1134   04/08/21 0823  ceFAZolin (ANCEF) IVPB 2g/100 mL premix  Status:  Discontinued        2 g 200 mL/hr over 30 Minutes Intravenous 30 min pre-op 04/08/21 0823 04/08/21 1212         Micro    Objective   Vitals:   05/01/21 0507 05/01/21 0643 05/01/21 0905 05/01/21 1343  BP: 132/69  (!) 121/59 (!) 141/68  Pulse: 67  73 68  Resp: $Remo'18  20 20  'ypYvm$ Temp: 98.4 F (36.9 C)  97.6 F (36.4  C) 97.7 F (36.5 C)  TempSrc: Oral  Oral Oral  SpO2: 92%  92% 95%  Weight:  87.1 kg    Height:        Intake/Output Summary (Last 24 hours) at 05/01/2021 1529 Last data filed at 05/01/2021 1348 Gross per 24 hour  Intake --  Output 500 ml  Net -500 ml   Filed Weights   04/25/21 0355 04/28/21 0500 05/01/21 0643  Weight: 88.9 kg 87.1 kg 87.1 kg    Physical Exam:  General exam: awake, alert, no acute distress HEENT: CorTrak in place with tube feeds running, nasal cannula in place, moist mucus membranes, hearing grossly normal  Respiratory system: clear anteriorly, normal respiratory effort, on 2 L/min supplemental oxygen. Cardiovascular system: normal S1/S2, RRR, 2+ radial pulses bilaterally, bilateral upper extremity edema, 1+ left lower extremity edema.   Gastrointestinal system: soft, NT, ND Central nervous system: A&O x3. no gross focal neurologic deficits, normal speech Psychiatry: normal mood, congruent affect, judgement and insight appear normal  Labs   Data Reviewed: I have personally reviewed following labs and imaging studies  CBC: Recent Labs  Lab 04/26/21 0125 04/27/21 0157 04/28/21 0116 04/30/21 0645 05/01/21 0715  WBC 14.0* 12.9* 13.3* 12.5* 15.9*  HGB 10.7* 10.2* 9.7* 9.2* 9.7*  HCT 33.7* 33.7* 31.7* 30.1* 31.1*  MCV 81.6 83.2 83.0 82.2 81.0  PLT 302 268 299 252 354   Basic Metabolic Panel: Recent Labs  Lab 04/25/21 0137 04/26/21 0125 04/27/21 0157 04/28/21 0116 04/29/21 0220 04/29/21 1739 04/30/21 0645 04/30/21 2226 05/01/21 0715  NA 138 141 143 144 144  --  145  --  142  K 3.6 3.9 3.6 3.5 3.8  --  3.8  --  3.8  CL 108 111 113* 114* 113*  --  114*  --  114*  CO2 22 21* 19* 21* 20*  --  20*  --  20*  GLUCOSE 131* 143* 113* 109* 84  --  142*  --  199*  BUN 37* 32* 30* 30* 28*  --  35*  --  46*  CREATININE 1.24 1.39* 1.40* 1.36* 1.37*  --  1.22  --  1.43*  CALCIUM 8.3* 8.6* 8.4* 8.5* 8.3*  --  8.4*  --  8.4*  MG 2.1 1.9 1.7  --  1.7  --  1.9   --  1.8  PHOS 1.8*  --   --   --   --  3.1 3.1 3.3  --    GFR: Estimated Creatinine Clearance: 47.5 mL/min (A) (by C-G formula based on SCr of 1.43 mg/dL (H)). Liver Function Tests: Recent Labs  Lab 04/25/21 0137 05/01/21 0715  AST  --  54*  ALT  --  42  ALKPHOS  --  92  BILITOT  --  1.2  PROT  --  6.0*  ALBUMIN 2.2* 2.0*   No results for input(s): LIPASE, AMYLASE in the last 168 hours. No results for input(s): AMMONIA in the last 168 hours. Coagulation Profile: Recent Labs  Lab 04/25/21 0137 04/26/21 0125 04/27/21 0157 04/28/21 0116 05/01/21 0715  INR 1.3* 1.4* 1.4* 1.4* 1.4*   Cardiac Enzymes: No results for input(s): CKTOTAL, CKMB, CKMBINDEX, TROPONINI in the last 168 hours. BNP (last 3 results) Recent Labs    08/25/20 1121 03/27/21 1123  PROBNP 21,395* 3,146*   HbA1C: No results for input(s): HGBA1C in the last 72 hours. CBG: Recent Labs  Lab 04/30/21 2028 05/01/21 0041 05/01/21 0506 05/01/21 0900 05/01/21 1340  GLUCAP 147* 180* 204* 218* 259*   Lipid Profile: No results for input(s): CHOL, HDL, LDLCALC, TRIG, CHOLHDL, LDLDIRECT in the last 72 hours. Thyroid Function Tests: No results for input(s): TSH, T4TOTAL, FREET4, T3FREE, THYROIDAB in the last 72 hours. Anemia Panel: Recent Labs    04/30/21 0645 05/01/21 0715  FERRITIN 961* 1,167*   Sepsis Labs: Recent Labs  Lab 04/29/21 0826  PROCALCITON 0.53    Recent Results (from the past 240 hour(s))  Resp Panel by RT-PCR (Flu A&B, Covid) Nasopharyngeal Swab     Status: Abnormal   Collection Time: 04/28/21  5:44 PM   Specimen: Nasopharyngeal Swab; Nasopharyngeal(NP) swabs in vial transport medium  Result Value Ref Range Status   SARS Coronavirus 2 by RT PCR POSITIVE (A) NEGATIVE Final    Comment: RESULT CALLED TO, READ BACK BY AND VERIFIED WITHDonetta Potts RN [id number] 04/28/21 A BROWNING (NOTE) SARS-CoV-2 target nucleic acids are DETECTED.  The SARS-CoV-2 RNA is generally detectable in upper  respiratory specimens during the acute phase of infection. Positive results are indicative of the presence of the identified virus, but do not rule out bacterial infection or co-infection with other pathogens not detected by the test. Clinical correlation with patient history and other diagnostic information is necessary to determine patient infection status. The expected result is Negative.  Fact Sheet for Patients: EntrepreneurPulse.com.au  Fact Sheet for Healthcare Providers: IncredibleEmployment.be  This test is not yet approved or cleared by the [place] FDA and  has been authorized for detection and/or diagnosis of SARS-CoV-2 by FDA under an Emergency Use Authorization (EUA).  This EUA will remain in effect (meaning this test can  be used) for the duration of  the COVID-19 declaration under Section 564(b)(1) of the Act, 21 U.S.C. section 360bbb-3(b)(1), unless the authorization is terminated or revoked sooner.     Influenza A by PCR NEGATIVE NEGATIVE Final   Influenza B by PCR NEGATIVE NEGATIVE Final    Comment: (NOTE) The Xpert Xpress SARS-CoV-2/FLU/RSV plus assay is intended as an aid in the diagnosis of influenza from Nasopharyngeal swab specimens and should not be used as a sole basis for treatment. Nasal washings and aspirates are unacceptable for Xpert Xpress SARS-CoV-2/FLU/RSV testing.  Fact Sheet for Patients: EntrepreneurPulse.com.au  Fact Sheet  for Healthcare Providers: IncredibleEmployment.be  This test is not yet approved or cleared by the [place] and has been authorized for detection and/or diagnosis of SARS-CoV-2 by FDA under an Emergency Use Authorization (EUA). This EUA will remain in effect (meaning this test can be used) for the duration of the COVID-19 declaration under Section 564(b)(1) of the Act, 21 U.S.C. section 360bbb-3(b)(1), unless the authorization is  terminated or revoked.  Performed at [facility], [place] [address], [place], [place] [address]       Imaging Studies   DG Abd Portable 1V  Result Date: 04/29/2021 CLINICAL DATA:  Feeding tube EXAM: PORTABLE ABDOMEN - 1 VIEW COMPARISON:  09/18/2012, CT 04/24/2021 FINDINGS: Esophageal tube tip overlies the gastroduodenal region. Left-sided percutaneous nephrostomy tube. Mild diffuse increased bowel gas without obstructive change. Airspace disease at left base. IMPRESSION: Esophageal tube tip overlies the distal stomach/gastroduodenal region Electronically Signed   By: Donavan Foil M.D.   On: 04/29/2021 19:20     Medications   Scheduled Meds:  amiodarone  200 mg Oral Daily   vitamin C  500 mg Oral Daily   atorvastatin  40 mg Oral q1800   dexamethasone  6 mg Oral Daily   docusate sodium  100 mg Oral BID   feeding supplement  237 mL Oral BID BM   feeding supplement (PROSource TF)  45 mL Per Tube TID   furosemide  20 mg Oral Daily   insulin aspart  0-15 Units Subcutaneous TID WC   Ipratropium-Albuterol  1 puff Inhalation Q6H   midodrine  10 mg Oral TID WC   multivitamin with minerals  1 tablet Oral Daily   pantoprazole  40 mg Oral Daily   sodium chloride flush  3 mL Intravenous Q12H   zinc sulfate  220 mg Oral Daily   Continuous Infusions:  feeding supplement (OSMOLITE 1.5 CAL) Stopped (05/01/21 1157)   remdesivir 100 mg in NS 100 mL 100 mg (05/01/21 1007)       LOS: 23 days    Time spent: 30 minutes      Ezekiel Slocumb, DO [facility]  05/01/2021, 3:29 PM      If 7PM-7AM, please contact night-coverage. How to contact the [facility] Attending or Consulting provider [place] or covering provider during after hours [place], for this patient?    Check the care team in [facility] and look for a) attending/consulting TR[facility] provider listed and b) the [facility] team listed Log into [email or web address] and use [place]'s universal password to access. If you do not have the  password, please contact the hospital operator. Locate the [facility] provider you are looking for under [facility] and page to a number that you can be directly reached. If you still have difficulty reaching the provider, please page the [facility] (Director on Call) for the Hospitalists listed on amion for assistance.

## 2021-05-02 ENCOUNTER — Inpatient Hospital Stay (HOSPITAL_COMMUNITY): Payer: Medicare HMO

## 2021-05-02 DIAGNOSIS — I70229 Atherosclerosis of native arteries of extremities with rest pain, unspecified extremity: Secondary | ICD-10-CM | POA: Diagnosis not present

## 2021-05-02 LAB — GLUCOSE, CAPILLARY
Glucose-Capillary: 127 mg/dL — ABNORMAL HIGH (ref 70–99)
Glucose-Capillary: 128 mg/dL — ABNORMAL HIGH (ref 70–99)
Glucose-Capillary: 139 mg/dL — ABNORMAL HIGH (ref 70–99)
Glucose-Capillary: 178 mg/dL — ABNORMAL HIGH (ref 70–99)
Glucose-Capillary: 195 mg/dL — ABNORMAL HIGH (ref 70–99)

## 2021-05-02 LAB — CBC
HCT: 31 % — ABNORMAL LOW (ref 39.0–52.0)
Hemoglobin: 9.9 g/dL — ABNORMAL LOW (ref 13.0–17.0)
MCH: 25.4 pg — ABNORMAL LOW (ref 26.0–34.0)
MCHC: 31.9 g/dL (ref 30.0–36.0)
MCV: 79.7 fL — ABNORMAL LOW (ref 80.0–100.0)
Platelets: 230 10*3/uL (ref 150–400)
RBC: 3.89 MIL/uL — ABNORMAL LOW (ref 4.22–5.81)
RDW: 21.3 % — ABNORMAL HIGH (ref 11.5–15.5)
WBC: 19.5 10*3/uL — ABNORMAL HIGH (ref 4.0–10.5)
nRBC: 0.2 % (ref 0.0–0.2)

## 2021-05-02 LAB — COMPREHENSIVE METABOLIC PANEL
ALT: 37 U/L (ref 0–44)
AST: 47 U/L — ABNORMAL HIGH (ref 15–41)
Albumin: 2 g/dL — ABNORMAL LOW (ref 3.5–5.0)
Alkaline Phosphatase: 94 U/L (ref 38–126)
Anion gap: 9 (ref 5–15)
BUN: 50 mg/dL — ABNORMAL HIGH (ref 8–23)
CO2: 20 mmol/L — ABNORMAL LOW (ref 22–32)
Calcium: 8.4 mg/dL — ABNORMAL LOW (ref 8.9–10.3)
Chloride: 113 mmol/L — ABNORMAL HIGH (ref 98–111)
Creatinine, Ser: 1.35 mg/dL — ABNORMAL HIGH (ref 0.61–1.24)
GFR, Estimated: 54 mL/min — ABNORMAL LOW (ref >60)
Glucose, Bld: 185 mg/dL — ABNORMAL HIGH (ref 70–99)
Potassium: 4.4 mmol/L (ref 3.5–5.1)
Sodium: 142 mmol/L (ref 135–145)
Total Bilirubin: 0.6 mg/dL (ref 0.3–1.2)
Total Protein: 6.1 g/dL — ABNORMAL LOW (ref 6.5–8.1)

## 2021-05-02 LAB — C-REACTIVE PROTEIN: CRP: 21.6 mg/dL — ABNORMAL HIGH (ref <1.0)

## 2021-05-02 LAB — MAGNESIUM: Magnesium: 1.9 mg/dL (ref 1.7–2.4)

## 2021-05-02 LAB — D-DIMER, QUANTITATIVE: D-Dimer, Quant: 18.63 ug/mL-FEU — ABNORMAL HIGH (ref 0.00–0.50)

## 2021-05-02 LAB — FERRITIN: Ferritin: 1143 ng/mL — ABNORMAL HIGH (ref 24–336)

## 2021-05-02 MED ORDER — BENZONATATE 100 MG PO CAPS
200.0000 mg | ORAL_CAPSULE | Freq: Three times a day (TID) | ORAL | Status: DC | PRN
  Administered 2021-05-02 – 2021-05-10 (×3): 200 mg via ORAL
  Filled 2021-05-02 (×3): qty 2

## 2021-05-02 MED ORDER — DM-GUAIFENESIN ER 30-600 MG PO TB12
1.0000 | ORAL_TABLET | Freq: Two times a day (BID) | ORAL | Status: DC
  Administered 2021-05-02 – 2021-05-18 (×32): 1 via ORAL
  Filled 2021-05-02 (×33): qty 1

## 2021-05-02 MED ORDER — MIDODRINE HCL 5 MG PO TABS
10.0000 mg | ORAL_TABLET | ORAL | Status: AC
  Administered 2021-05-03: 10 mg via ORAL
  Filled 2021-05-02: qty 2

## 2021-05-02 MED ORDER — FUROSEMIDE 10 MG/ML IJ SOLN
40.0000 mg | INTRAMUSCULAR | Status: AC
  Administered 2021-05-03: 40 mg via INTRAVENOUS
  Filled 2021-05-02: qty 4

## 2021-05-02 NOTE — Progress Notes (Signed)
PROGRESS NOTE    Ryan [name]   XTK:[id number]  DOB: 12/05/1945  PCP: [name], MD    DOA: 04/08/2021 LOS: 24   Assessment & Plan   Principal Problem:   Critical lower limb ischemia (Home) Active Problems:   Chronic systolic heart failure (HCC)   Status post mitral valve replacement with bioprosthetic valve   Diabetes mellitus ([place])   Essential hypertension   ICD (implantable cardioverter-defibrillator) in place   Stage 3 chronic kidney disease ([place])   Pressure injury of skin   Multifocal pneumonia due to COVID-19  - Chest x-ray showed some opacity initially thought to be fluid or atelectasis but, now Covid+ so more likely infection especially given his fever and cough --Continue supplemental oxygen, keep sats >90% - Continue remdesivir, steroids --Scheduled Mucinex, as needed Tessalon -- I-S, flutter, bronchodilator/inhaler - Follow inflammatory markers and CMP  Poor p.o. intake -dietitian is following.  CorTrak in place. --Continue tube feeds to supplement nutrition --Encourage p.o. intake Body mass index is 26.31 kg/m.  Leukocytosis  -most likely due to steroids being given for COVID infection with associated hypoxia.  Monitor CBC   Right critical lower limb ischemia [name]): Vascular Surgery-recommends dressing changes. Wound care team consulted Lower extremity Dopplers negative for DVT.   Surgical debridement of the right heel with application of myriad substitute 8/8.   Vascular surgery, Dr. Luan Pulling recommends routine dressing changes.  Outpatient follow-up in 1-2 weeks will be arranged by their service.   Retroperitoneal hematoma Acute blood loss anemia: -CT scan of the abdomen and pelvis showed new retroperitoneal hematoma he was given Vit K & FFP. Repeat CT A/P on 8/5- remains large unchanged except increase in Pleural effusion vs atelactesis.    AKI from ATN on chronic kidney disease stage IIIb: Creatinine peaked at 5.63; Improving.  -Secondary to  hypotension and retroperitoneal bleed displacing left kidney - Status post hemodialysis on 8/2  --HD catheter has been removed - Seen by nephro, urology and IR.   -- Follow-up urology outpatient as necessary.   Dr. Reesa Chew spoke with IR, Dr. Earleen Newport 8/8 - continue PCN at least for 2 months, follow-up will be arranged by their team.  Recommend routine drainage of catheter/bag and dry dressing changes.  Chronic systolic heart failure ([place]), EF 40-45% -On Midodrine 27m po tid.    Echo 03/04/2021-EF 40-45%, global hypokinesis.   Follow-up outpatient CHF.   On Lasix 20 mg daily  Monitor volume status    Permanent atrial fibrillation/Status post mitral valve replacement with bioprosthetic valve: Bioprosthetic valve. Anticoagulation on hold.  Amiodarone 2032mdaily    Diabetes mellitus type 2: Acceptable range.  -Hemoglobin A1c 5.4.  Insulin sliding scale and Accu-Cheks  Hx of Essential HTN but currently borderline low BP -midodrine 10 mg TID  Hyperlipidemia: -Lipitor 40 mg daily   Ethics/goals of care: Seen by Palliative Care    Patient BMI: Body mass index is 26.31 kg/m.   DVT prophylaxis:    Diet:  Diet Orders (From admission, onward)     Start     Ordered   04/29/21 1436  Diet regular Room service appropriate? Yes; Fluid consistency: Thin; Fluid restriction: 1800 mL Fluid  Diet effective now       Question Answer Comment  Room service appropriate? Yes   Fluid consistency: Thin   Fluid restriction: 1800 mL Fluid      04/29/21 1437              Code Status: Full Code  Brief Narrative / Hospital Course to Date:   "75 y.o. 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  Scheduled to have  debridement on 04/08/2021 but due to an INR of 3.2 surgery was postponed.  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  He has completed his course of antibiotics in house last day was 04/13/2019.  He developed a retroperitoneal hematoma with left kidney displacement and hypotension developed ATN and hyperkalemia, nephrology was consulted recommended renal replacement therapy, urology was consulted due to his left hydronephrosis would like recommended a left nephrostomy tube.  Nephrostomy tube placed on 8/1 and central line placement for HD.  Patient did well after 1 HD treatment and renal function recovered.  HD catheter removed.  PT recommended SNF.  Cleared by vascular, 1-2-week follow improvement be arranged by their service.  During screening status for SNF placement patient tested positive for COVID-19"  Subjective 05/02/21    Pt reports ongoing cough with intermittent phlegm production, feels like some phlegm not able to cough up.  Asking for more water.  Denies fevers or chills.  Tolerating tube feeds.  Denies significant pain in his foot.  No acute events reported.   Disposition Plan & Communication   Status is: Inpatient  Remains inpatient appropriate because: COVID-positive with fevers and cough; on 10-day isolation prior to SNF discharge  Dispo: The patient is from: Home              Anticipated d/c is to: SNF              Patient currently is not medically stable to d/c.   Difficult to place patient No  Family communication - spoke with patient's son on patient's cell phone during encounter today (8/12)   Consults, Procedures, Significant Events   Consultants:  Palliative care Vascular surgery Nephrology  Procedures:  As per above narrative  Antimicrobials:  Anti-infectives (From admission, onward)    Start     Dose/Rate Route Frequency Ordered Stop   04/30/21  1000  remdesivir 100 mg in sodium chloride 0.9 % 100 mL IVPB       See Hyperspace for full Linked Orders Report.   100 mg 200 mL/hr over 30 Minutes Intravenous Daily 04/29/21 0732 05/04/21 0959   04/29/21 0900  remdesivir 200 mg in sodium chloride 0.9% 250 mL IVPB       See Hyperspace for full Linked Orders Report.   200 mg 580 mL/hr over 30 Minutes Intravenous Once 04/29/21 0732 04/29/21 1030   04/21/21 0800  cefTRIAXone (ROCEPHIN) 1 g in sodium chloride 0.9 % 100 mL IVPB  Status:  Discontinued        1 g 200 mL/hr over 30 Minutes Intravenous Every 24 hours 04/20/21 1147 04/27/21 1354   04/20/21 1200  cefTRIAXone (ROCEPHIN) 2 g in sodium chloride 0.9 % 100 mL IVPB        2 g 200 mL/hr over 30 Minutes Intravenous To Radiology 04/20/21 1107 04/20/21 1254   04/08/21 2000  cefTRIAXone (ROCEPHIN) 2 g in sodium chloride 0.9 % 100 mL IVPB  Status:  Discontinued       See Hyperspace for full Linked Orders Report.   2 g 200  mL/hr over 30 Minutes Intravenous Every 24 hours 04/08/21 1212 04/13/21 1134   04/08/21 2000  metroNIDAZOLE (FLAGYL) IVPB 500 mg  Status:  Discontinued       See Hyperspace for full Linked Orders Report.   500 mg 100 mL/hr over 60 Minutes Intravenous Every 8 hours 04/08/21 1212 04/13/21 1134   04/08/21 0823  ceFAZolin (ANCEF) IVPB 2g/100 mL premix  Status:  Discontinued        2 g 200 mL/hr over 30 Minutes Intravenous 30 min pre-op 04/08/21 0823 04/08/21 1212         Micro    Objective   Vitals:   05/01/21 2353 05/02/21 0337 05/02/21 0930 05/02/21 1157  BP: 130/74 127/64 128/65 (!) 149/70  Pulse: 72 71 75 71  Resp: _0 Temp: 97.6 F (36.4 C) 98.4 F (36.9 C) 98 F (36.7 C) 97.7 F (36.5 C)  TempSrc: Oral Oral Oral Oral  SpO2: 95% 92% 90% 90%  Weight:  88 kg    Height:        Intake/Output Summary (Last 24 hours) at 05/02/2021 1517 Last data filed at 05/02/2021 0941 Gross per 24 hour  Intake 1105 ml  Output 550 ml  Net 555 ml   Filed Weights    04/28/21 0500 05/01/21 0643 05/02/21 0337  Weight: 87.1 kg 87.1 kg 88 kg    Physical Exam:  General exam: awake, alert, no acute distress HEENT: CorTrak in place with tube feeds running, nasal cannula in place, moist mucus membranes, hearing grossly normal  Respiratory system: coarse sounding cough, clear anteriorly, no wheezes, normal respiratory effort, on 2 L/min supplemental oxygen. Cardiovascular system: normal S1/S2, RRR, 2+ radial pulses bilaterally, bilateral upper extremity edema, 1+ left lower extremity edema.   Gastrointestinal system: soft, NT, ND Central nervous system: A&O x3. no gross focal neurologic deficits, normal speech  Labs   Data Reviewed: I have personally reviewed following labs and imaging studies  CBC: Recent Labs  Lab 04/27/21 0157 04/28/21 0116 04/30/21 0645 05/01/21 0715 05/02/21 0145  WBC 12.9* 13.3* 12.5* 15.9* 19.5*  HGB 10.2* 9.7* 9.2* 9.7* 9.9*  HCT 33.7* 31.7* 30.1* 31.1* 31.0*  MCV 83.2 83.0 82.2 81.0 79.7*  PLT 268 299 252 261 416   Basic Metabolic Panel: Recent Labs  Lab 04/27/21 0157 04/28/21 0116 04/29/21 0220 04/29/21 1739 04/30/21 0645 04/30/21 2226 05/01/21 0715 05/02/21 0145  NA 143 144 144  --  145  --  142 142  K 3.6 3.5 3.8  --  3.8  --  3.8 4.4  CL 113* 114* 113*  --  114*  --  114* 113*  CO2 19* 21* 20*  --  20*  --  20* 20*  GLUCOSE 113* 109* 84  --  142*  --  199* 185*  BUN 30* 30* 28*  --  35*  --  46* 50*  CREATININE 1.40* 1.36* 1.37*  --  1.22  --  1.43* 1.35*  CALCIUM 8.4* 8.5* 8.3*  --  8.4*  --  8.4* 8.4*  MG 1.7  --  1.7  --  1.9  --  1.8 1.9  PHOS  --   --   --  3.1 3.1 3.3  --   --    GFR: Estimated Creatinine Clearance: 50.3 mL/min (A) (by C-G formula based on SCr of 1.35 mg/dL (H)). Liver Function Tests: Recent Labs  Lab 05/01/21 0715 05/02/21 0145  AST 54* 47*  ALT 42 37  ALKPHOS  92 94  BILITOT 1.2 0.6  PROT 6.0* 6.1*  ALBUMIN 2.0* 2.0*   No results for input(s): LIPASE, AMYLASE in the  last 168 hours. No results for input(s): AMMONIA in the last 168 hours. Coagulation Profile: Recent Labs  Lab 04/26/21 0125 04/27/21 0157 04/28/21 0116 05/01/21 0715  INR 1.4* 1.4* 1.4* 1.4*   Cardiac Enzymes: No results for input(s): CKTOTAL, CKMB, CKMBINDEX, TROPONINI in the last 168 hours. BNP (last 3 results) Recent Labs    08/25/20 1121 03/27/21 1123  PROBNP 21,395* 3,146*   HbA1C: No results for input(s): HGBA1C in the last 72 hours. CBG: Recent Labs  Lab 05/01/21 1812 05/01/21 2121 05/02/21 0000 05/02/21 0625 05/02/21 1156  GLUCAP 123* 174* 178* 195* 127*   Lipid Profile: No results for input(s): CHOL, HDL, LDLCALC, TRIG, CHOLHDL, LDLDIRECT in the last 72 hours. Thyroid Function Tests: No results for input(s): TSH, T4TOTAL, FREET4, T3FREE, THYROIDAB in the last 72 hours. Anemia Panel: Recent Labs    05/01/21 0715 05/02/21 0145  FERRITIN 1,167* 1,143*   Sepsis Labs: Recent Labs  Lab 04/29/21 0826  PROCALCITON 0.53    Recent Results (from the past 240 hour(s))  Resp Panel by RT-PCR (Flu A&B, Covid) Nasopharyngeal Swab     Status: Abnormal   Collection Time: 04/28/21  5:44 PM   Specimen: Nasopharyngeal Swab; Nasopharyngeal(NP) swabs in vial transport medium  Result Value Ref Range Status   SARS Coronavirus 2 by RT PCR POSITIVE (A) NEGATIVE Final    Comment: RESULT CALLED TO, READ BACK BY AND VERIFIED WITHDonetta Potts RN [id number] 04/28/21 A BROWNING (NOTE) SARS-CoV-2 target nucleic acids are DETECTED.  The SARS-CoV-2 RNA is generally detectable in upper respiratory specimens during the acute phase of infection. Positive results are indicative of the presence of the identified virus, but do not rule out bacterial infection or co-infection with other pathogens not detected by the test. Clinical correlation with patient history and other diagnostic information is necessary to determine patient infection status. The expected result is Negative.  Fact Sheet  for Patients: EntrepreneurPulse.com.au  Fact Sheet for Healthcare Providers: IncredibleEmployment.be  This test is not yet approved or cleared by the [place] FDA and  has been authorized for detection and/or diagnosis of SARS-CoV-2 by FDA under an Emergency Use Authorization (EUA).  This EUA will remain in effect (meaning this test can  be used) for the duration of  the COVID-19 declaration under Section 564(b)(1) of the Act, 21 U.S.C. section 360bbb-3(b)(1), unless the authorization is terminated or revoked sooner.     Influenza A by PCR NEGATIVE NEGATIVE Final   Influenza B by PCR NEGATIVE NEGATIVE Final    Comment: (NOTE) The Xpert Xpress SARS-CoV-2/FLU/RSV plus assay is intended as an aid in the diagnosis of influenza from Nasopharyngeal swab specimens and should not be used as a sole basis for treatment. Nasal washings and aspirates are unacceptable for Xpert Xpress SARS-CoV-2/FLU/RSV testing.  Fact Sheet for Patients: EntrepreneurPulse.com.au  Fact Sheet for Healthcare Providers: IncredibleEmployment.be  This test is not yet approved or cleared by the [place] FDA and has been authorized for detection and/or diagnosis of SARS-CoV-2 by FDA under an Emergency Use Authorization (EUA). This EUA will remain in effect (meaning this test can be used) for the duration of the COVID-19 declaration under Section 564(b)(1) of the Act, 21 U.S.C. section 360bbb-3(b)(1), unless the authorization is terminated or revoked.  Performed at [facility], [place] [address], [place], [place] [address]  Imaging Studies   No results found.   Medications   Scheduled Meds:  amiodarone  200 mg Oral Daily   vitamin C  500 mg Oral Daily   atorvastatin  40 mg Oral q1800   dexamethasone  6 mg Oral Daily   dextromethorphan-guaiFENesin  1 tablet Oral BID   docusate sodium  100 mg Oral BID   feeding  supplement  237 mL Oral BID BM   feeding supplement (PROSource TF)  45 mL Per Tube TID   furosemide  20 mg Oral Daily   insulin aspart  0-15 Units Subcutaneous TID WC   Ipratropium-Albuterol  1 puff Inhalation Q6H   midodrine  10 mg Oral TID WC   multivitamin with minerals  1 tablet Oral Daily   pantoprazole  40 mg Oral Daily   sodium chloride flush  3 mL Intravenous Q12H   zinc sulfate  220 mg Oral Daily   Continuous Infusions:  feeding supplement (OSMOLITE 1.5 CAL) Stopped (05/02/21 0930)   remdesivir 100 mg in NS 100 mL 100 mg (05/02/21 0935)       LOS: 24 days    Time spent: 25 minutes with > 50% spent at bedside and in coordination of care      Ezekiel Slocumb, DO [facility]  05/02/2021, 3:17 PM      If 7PM-7AM, please contact night-coverage. How to contact the [facility] Attending or Consulting provider [place] or covering provider during after hours [place], for this patient?    Check the care team in [facility] and look for a) attending/consulting [facility] provider listed and b) the [facility] team listed Log into [email or web address] and use [place]'s universal password to access. If you do not have the password, please contact the hospital operator. Locate the [facility] provider you are looking for under [facility] and page to a number that you can be directly reached. If you still have difficulty reaching the provider, please page the [facility] (Director on Call) for the Hospitalists listed on amion for assistance.

## 2021-05-02 NOTE — Progress Notes (Signed)
Pt began to desat on 3L [place]. Slowly bumped pt up to 8L [facility]. Called respiratory to check on pt. Respiratory bumped pt to 12L [place]. Paged on call MD to make aware of pt current requirements. New orders placed.

## 2021-05-03 DIAGNOSIS — I70229 Atherosclerosis of native arteries of extremities with rest pain, unspecified extremity: Secondary | ICD-10-CM | POA: Diagnosis not present

## 2021-05-03 LAB — COMPREHENSIVE METABOLIC PANEL
ALT: 37 U/L (ref 0–44)
AST: 49 U/L — ABNORMAL HIGH (ref 15–41)
Albumin: 2.2 g/dL — ABNORMAL LOW (ref 3.5–5.0)
Alkaline Phosphatase: 97 U/L (ref 38–126)
Anion gap: 10 (ref 5–15)
BUN: 55 mg/dL — ABNORMAL HIGH (ref 8–23)
CO2: 19 mmol/L — ABNORMAL LOW (ref 22–32)
Calcium: 8.5 mg/dL — ABNORMAL LOW (ref 8.9–10.3)
Chloride: 112 mmol/L — ABNORMAL HIGH (ref 98–111)
Creatinine, Ser: 1.28 mg/dL — ABNORMAL HIGH (ref 0.61–1.24)
GFR, Estimated: 58 mL/min — ABNORMAL LOW (ref >60)
Glucose, Bld: 142 mg/dL — ABNORMAL HIGH (ref 70–99)
Potassium: 5 mmol/L (ref 3.5–5.1)
Sodium: 141 mmol/L (ref 135–145)
Total Bilirubin: 1.2 mg/dL (ref 0.3–1.2)
Total Protein: 6.5 g/dL (ref 6.5–8.1)

## 2021-05-03 LAB — BLOOD GAS, VENOUS
Acid-base deficit: 2.8 mmol/L — ABNORMAL HIGH (ref 0.0–2.0)
Bicarbonate: 21.8 mmol/L (ref 20.0–28.0)
FIO2: 100
O2 Saturation: 23.8 %
Patient temperature: 36.4
pCO2, Ven: 37.9 mmHg — ABNORMAL LOW (ref 44.0–60.0)
pH, Ven: 7.374 (ref 7.250–7.430)
pO2, Ven: <31 mmHg — CL (ref 32.0–45.0)

## 2021-05-03 LAB — BLOOD GAS, ARTERIAL
Acid-Base Excess: UNDETERMINED mmol/L (ref 0.0–2.0)
Acid-base deficit: UNDETERMINED mmol/L (ref 0.0–2.0)
Allens test (pass/fail): UNDETERMINED
Amplitude: UNDETERMINED
Bicarbonate: UNDETERMINED mmol/L (ref 20.0–28.0)
Collection site: UNDETERMINED
Delivery systems: UNDETERMINED
Drawn by: UNDETERMINED
Expiratory PAP: UNDETERMINED
FIO2: UNDETERMINED
Hertz: UNDETERMINED
Hi Frequency JET Vent PIP: UNDETERMINED
Hi Frequency JET Vent Rate: UNDETERMINED
Inspiratory PAP: UNDETERMINED
MECHVT: UNDETERMINED mL
Map: UNDETERMINED cmH20
Mechanical Rate: UNDETERMINED
Mode: UNDETERMINED
Nitric Oxide: UNDETERMINED
O2 Content: UNDETERMINED L/min
O2 Saturation: UNDETERMINED %
Oxygen index: UNDETERMINED
PEEP: UNDETERMINED cmH2O
PIP: UNDETERMINED cmH2O
Patient temperature: UNDETERMINED
Pressure control: UNDETERMINED cmH2O
Pressure support: UNDETERMINED cmH2O
RATE: UNDETERMINED resp/min
Sample type: UNDETERMINED
pCO2 arterial: UNDETERMINED mmHg (ref 32.0–48.0)
pH, Arterial: UNDETERMINED (ref 7.350–7.450)
pO2, Arterial: UNDETERMINED mmHg (ref 83.0–108.0)

## 2021-05-03 LAB — D-DIMER, QUANTITATIVE: D-Dimer, Quant: 19.62 ug/mL-FEU — ABNORMAL HIGH (ref 0.00–0.50)

## 2021-05-03 LAB — FERRITIN: Ferritin: 1324 ng/mL — ABNORMAL HIGH (ref 24–336)

## 2021-05-03 LAB — GLUCOSE, CAPILLARY
Glucose-Capillary: 121 mg/dL — ABNORMAL HIGH (ref 70–99)
Glucose-Capillary: 107 mg/dL — ABNORMAL HIGH (ref 70–99)
Glucose-Capillary: 103 mg/dL — ABNORMAL HIGH (ref 70–99)
Glucose-Capillary: 138 mg/dL — ABNORMAL HIGH (ref 70–99)
Glucose-Capillary: 119 mg/dL — ABNORMAL HIGH (ref 70–99)

## 2021-05-03 LAB — CBC
HCT: 33 % — ABNORMAL LOW (ref 39.0–52.0)
Hemoglobin: 10.1 g/dL — ABNORMAL LOW (ref 13.0–17.0)
MCH: 25.4 pg — ABNORMAL LOW (ref 26.0–34.0)
MCHC: 30.6 g/dL (ref 30.0–36.0)
MCV: 83.1 fL (ref 80.0–100.0)
Platelets: 261 10*3/uL (ref 150–400)
RBC: 3.97 MIL/uL — ABNORMAL LOW (ref 4.22–5.81)
RDW: 21.8 % — ABNORMAL HIGH (ref 11.5–15.5)
WBC: 24.7 10*3/uL — ABNORMAL HIGH (ref 4.0–10.5)
nRBC: 0.1 % (ref 0.0–0.2)

## 2021-05-03 LAB — MAGNESIUM: Magnesium: 1.9 mg/dL (ref 1.7–2.4)

## 2021-05-03 LAB — PROCALCITONIN: Procalcitonin: 0.49 ng/mL

## 2021-05-03 LAB — C-REACTIVE PROTEIN: CRP: 18.9 mg/dL — ABNORMAL HIGH (ref <1.0)

## 2021-05-03 LAB — BRAIN NATRIURETIC PEPTIDE: B Natriuretic Peptide: 1209.2 pg/mL — ABNORMAL HIGH (ref 0.0–100.0)

## 2021-05-03 LAB — MRSA NEXT GEN BY PCR, NASAL: MRSA by PCR Next Gen: POSITIVE — AB

## 2021-05-03 MED ORDER — GUAIFENESIN-DM 100-10 MG/5ML PO SYRP
5.0000 mL | ORAL_SOLUTION | ORAL | Status: DC | PRN
  Administered 2021-05-05 – 2021-05-23 (×19): 5 mL via ORAL
  Filled 2021-05-03 (×20): qty 5

## 2021-05-03 MED ORDER — PIPERACILLIN-TAZOBACTAM 3.375 G IVPB
3.3750 g | Freq: Three times a day (TID) | INTRAVENOUS | Status: DC
  Administered 2021-05-03 – 2021-05-06 (×10): 3.375 g via INTRAVENOUS
  Filled 2021-05-03 (×10): qty 50

## 2021-05-03 MED ORDER — FUROSEMIDE 10 MG/ML IJ SOLN
40.0000 mg | Freq: Two times a day (BID) | INTRAMUSCULAR | Status: DC
  Administered 2021-05-03 – 2021-05-04 (×3): 40 mg via INTRAVENOUS
  Filled 2021-05-03 (×3): qty 4

## 2021-05-03 MED ORDER — FUROSEMIDE 10 MG/ML IJ SOLN
40.0000 mg | INTRAMUSCULAR | Status: AC
  Administered 2021-05-03: 40 mg via INTRAVENOUS
  Filled 2021-05-03: qty 4

## 2021-05-03 MED ORDER — ENOXAPARIN SODIUM 40 MG/0.4ML IJ SOSY
40.0000 mg | PREFILLED_SYRINGE | Freq: Every day | INTRAMUSCULAR | Status: DC
  Administered 2021-05-03 – 2021-05-04 (×2): 40 mg via SUBCUTANEOUS
  Filled 2021-05-03 (×2): qty 0.4

## 2021-05-03 NOTE — Progress Notes (Signed)
Received a call from bedside RN regarding the patient being in acute respiratory distress with increasing oxygen demand from 3 L nasal cannula to 12 L high flow nasal cannula.  Ordered chest x-ray which I personally reviewed, showed increasing pulmonary infiltrates right greater than left.  Held off tube feeding overnight due to concern for aspiration.  Ordered procalcitonin and started on IV Zosyn empirically.  Also ordered BNP due to concern for volume overload.  1 dose of IV Lasix 40 mg x 1 administered.  Came to bedside, the patient is on high flow nasal cannula and nonrebreather.  Physical exam notable for left-sided JVD.  Pitting edema all the way up to his thighs.  An additional dose of IV Lasix ordered 40 mg x 1.  Patient is alert and oriented x4.  He denies any chest pain.  Endorses a productive cough.  States at times he has difficulty with swallowing.  Speech therapist consulted for swallow evaluation.  Recent history of large retroperitoneal hematoma and left iliacus muscle hematoma.  Coumadin on hold.  Latest INR 1.4 on 05/01/2021.  With uprising D-dimer 19.6 and stable hemoglobin 10.1 on 05/03/2021, will add subcu Lovenox for DVT prophylaxis and defer full dose anticoagulation to dayshift team.  We will continue to closely monitor and treat as indicated.

## 2021-05-03 NOTE — Evaluation (Signed)
Clinical/Bedside Swallow Evaluation Patient Details  Name: Ryan [name] MRN: [id number] Date of Birth: Aug 27, 1946  Today's Date: 05/03/2021 Time: SLP Start Time (ACUTE ONLY): 1445 SLP Stop Time (ACUTE ONLY): 1510 SLP Time Calculation (min) (ACUTE ONLY): 25 min  Past Medical History:  Past Medical History:  Diagnosis Date   AICD (automatic cardioverter/defibrillator) present    Arthritis    Atrial fibrillation (HCC)    Cardiomyopathy, dilated (HCC)    CHF (congestive heart failure) ([place])    Diabetes mellitus without complication ([place])    Endocarditis    Hypertension    Peripheral vascular disease ([place])    PVC's (premature ventricular contractions)    SVT (supraventricular tachycardia)  long RP    Past Surgical History:  Past Surgical History:  Procedure Laterality Date   ABDOMINAL AORTAGRAM N/A 10/03/2012   Procedure: ABDOMINAL Maxcine Ham;  Surgeon: [name], MD;  Location: [facility] CATH LAB;  Service: Cardiovascular;  Laterality: N/A;   ABDOMINAL AORTOGRAM W/LOWER EXTREMITY N/A 02/25/2021   Procedure: ABDOMINAL AORTOGRAM W/LOWER EXTREMITY;  Surgeon: Cherre Robins, MD;  Location: [place] CV LAB;  Service: Cardiovascular;  Laterality: N/A;   ABDOMINAL AORTOGRAM W/LOWER EXTREMITY N/A 04/13/2021   Procedure: ABDOMINAL AORTOGRAM W/LOWER EXTREMITY;  Surgeon: Waynetta Sandy, MD;  Location: [place] CV LAB;  Service: Cardiovascular;  Laterality: N/A;   APPLICATION OF WOUND VAC Right 04/09/2021   Procedure: APPLICATION OF WOUND VAC;  Surgeon: Cherre Robins, MD;  Location: [place];  Service: Vascular;  Laterality: Right;   BYPASS GRAFT FEMORAL-PERONEAL Right 03/06/2021   Procedure: RIGHT ABOVE KNEE POPLITEAL ARTERY-PERONEAL BYPASS;  Surgeon: Cherre Robins, MD;  Location: [place];  Service: Vascular;  Laterality: Right;   CORONARY ANGIOGRAM  09/21/2012   Procedure: CORONARY ANGIOGRAM;  Surgeon: [name], MD;  Location: [facility] CATH LAB;  Service: Cardiovascular;;   EP  IMPLANTABLE DEVICE N/A 06/11/2016   Procedure: ICD Implant;  Surgeon: Will Meredith Leeds, MD;  Location: [place] CV LAB;  Service: Cardiovascular;  Laterality: N/A;   EXTREMITY WIRE/PIN REMOVAL  09/14/2012   Procedure: REMOVAL K-WIRE/PIN EXTREMITY;  Surgeon: [name], MD;  Location: [place];  Service: Orthopedics;  Laterality: Right;  Right Foot   I & D EXTREMITY  09/14/2012   Procedure: IRRIGATION AND DEBRIDEMENT EXTREMITY;  Surgeon: Tennis Must, MD;  Location: [place];  Service: Orthopedics;  Laterality: Right;   INTRAOPERATIVE TRANSESOPHAGEAL ECHOCARDIOGRAM  09/26/2012   Procedure: INTRAOPERATIVE TRANSESOPHAGEAL ECHOCARDIOGRAM;  Surgeon: [name], MD;  Location: [facility] OR;  Service: Open Heart Surgery;  Laterality: N/A;   IR FLUORO GUIDE CV LINE RIGHT  04/20/2021   IR NEPHROSTOMY PLACEMENT LEFT  04/20/2021   IR US GUIDE VASC ACCESS RIGHT  04/20/2021   MITRAL VALVE REPLACEMENT  09/26/2012   Procedure: MITRAL VALVE (MV) REPLACEMENT;  Surgeon: [name], MD;  Location: [place];  Service: Open Heart Surgery;  Laterality: N/A;   RIGHT HEART CATH N/A 08/29/2020   Procedure: RIGHT HEART CATH;  Surgeon: Larey Dresser, MD;  Location: [place] CV LAB;  Service: Cardiovascular;  Laterality: N/A;   RIGHT HEART CATHETERIZATION  09/21/2012   Procedure: RIGHT HEART CATH;  Surgeon: [name], MD;  Location: [facility] CATH LAB;  Service: Cardiovascular;;   RIGHT/LEFT HEART CATH AND CORONARY ANGIOGRAPHY N/A 01/30/2021   Procedure: RIGHT/LEFT HEART CATH AND CORONARY ANGIOGRAPHY;  Surgeon: Larey Dresser, MD;  Location: [place] CV LAB;  Service: Cardiovascular;  Laterality: N/A;  SVT ABLATION N/A 09/03/2020   Procedure: SVT ABLATION;  Surgeon: Constance Haw, MD;  Location: [place] CV LAB;  Service: Cardiovascular;  Laterality: N/A;   TEE WITHOUT CARDIOVERSION  09/18/2012   Procedure: TRANSESOPHAGEAL ECHOCARDIOGRAM (TEE);  Surgeon: [name], MD;  Location: [facility] ENDOSCOPY;  Service:  Cardiovascular;  Laterality: N/A;   WOUND DEBRIDEMENT Right 04/09/2021   Procedure: DEBRIDEMENT RIGHT HEEL WOUND AND PARTIAL FIRST TOE AMPUTATION AND SECOND TOE AMPUTATION. Application of Myriad skin substitute;  Surgeon: Cherre Robins, MD;  Location: [facility] OR;  Service: Vascular;  Laterality: Right;   HPI:  Patient is a 75  y.o. male with PMH: atrial fibrillation on Coumadin mitral valve replacement, chronic systolic heart failure with an AICD and placed, essential hypertension peripheral vascular disease, also history of peripheral vascular bypass with an ongoing wound infection initially hospitalized for foot debridement and possible BKA. He had been scheduled to have debridement on 04/08/2021 but due to an INR of 3.2 surgery was postponed.  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 He developed a retroperitoneal hematoma with left kidney displacement and hypotension developed ATN and hyperkalemia, nephrology was consulted recommended renal replacement therapy, urology was consulted due to his left hydronephrosis would like recommended a left nephrostomy tube.  Nephrostomy tube placed on 8/1 and central line placement for HD.  Patient did well after 1 HD treatment and renal function recovered.  During screening status for SNF placement, patient tested positive for COVID-19. Evening of 8/13, patient had significantly increased oxygen requirement and in AM of 8/14 was on 25L/min 100% FiO2 HFNC plus nonrebreather. Cortrak in place to supplement nutrition. SLP ordered to assess swallow function due to patient reporting difficulty swallowing at times as well as endorsing a productive cough.   Assessment / Plan / Recommendation Clinical Impression  Patient presents with a mild oropharyngeal dysphagia but without overt s/s aspiration or penetration. He demonstrated delayed mastication of solids even  with dentures in place, however only trace oral residuals remained after PO intake. No overt s/s aspiratoin or penetration observed with straw sips of thin liquids and swallow initiation appeared timely. Patient's voice remained clear throughout assessment. He continues to require HFNC in addition to NRB however he was able to tolerate brief periods of removing NRB so he could consume liquid and solid PO's. When mask off, oxygen saturations started to slowly trend down but mask was replaced with SpO2 lower than 93% and his oxygen saturation would improve back to 100%. SLP is recommending that patient continue on regular solids, thin liquids diet but with recommendation of frequent rest breaks, limited amount of time with NRB off and to eat more frequent small amounts of food and drink due to continued fatigue and lethargy. SLP Visit Diagnosis: Dysphagia, unspecified (R13.10)    Aspiration Risk  Mild aspiration risk    Diet Recommendation Regular;Thin liquid   Liquid Administration via: Straw;Cup Medication Administration: Whole meds with liquid Supervision: Staff to assist with self feeding Compensations: Minimize environmental distractions;Slow rate;Small sips/bites Postural Changes: Seated upright at 90 degrees    Other  Recommendations Oral Care Recommendations: Oral care BID Other Recommendations: Have oral suction available   Follow up Recommendations None      Frequency and Duration min 1 x/week  1 week       Prognosis Prognosis for Safe Diet Advancement: Good      Swallow Study   General Date of Onset:  05/02/21 HPI: Patient is a 75  y.o. male with PMH: atrial fibrillation on Coumadin mitral valve replacement, chronic systolic heart failure with an AICD and placed, essential hypertension peripheral vascular disease, also history of peripheral vascular bypass with an ongoing wound infection initially hospitalized for foot debridement and possible BKA. He had been scheduled to have  debridement on 04/08/2021 but due to an INR of 3.2 surgery was postponed.  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 He developed a retroperitoneal hematoma with left kidney displacement and hypotension developed ATN and hyperkalemia, nephrology was consulted recommended renal replacement therapy, urology was consulted due to his left hydronephrosis would like recommended a left nephrostomy tube.  Nephrostomy tube placed on 8/1 and central line placement for HD.  Patient did well after 1 HD treatment and renal function recovered.  During screening status for SNF placement, patient tested positive for COVID-19. Evening of 8/13, patient had significantly increased oxygen requirement and in AM of 8/14 was on 25L/min 100% FiO2 HFNC plus nonrebreather. Cortrak in place to supplement nutrition. SLP ordered to assess swallow function due to patient reporting difficulty swallowing at times as well as endorsing a productive cough. Type of Study: Bedside Swallow Evaluation Previous Swallow Assessment: none found Diet Prior to this Study: Regular;Thin liquids Temperature Spikes Noted: No Respiratory Status: Nasal cannula;Non-rebreather History of Recent Intubation: No Behavior/Cognition: Alert;Cooperative;Pleasant mood Oral Cavity Assessment: Within Functional Limits Oral Care Completed by SLP: Yes Oral Cavity - Dentition: Dentures, bottom;Dentures, top Self-Feeding Abilities: Needs assist;Needs set up;Total assist Patient Positioning: Upright in bed Baseline Vocal Quality: Normal Volitional Cough: Strong Volitional Swallow: Able to elicit    Oral/Motor/Sensory Function Overall Oral Motor/Sensory Function: Within functional limits   Ice Chips     Thin Liquid Thin Liquid: Within functional limits Presentation: Straw    Nectar Thick     Honey Thick     Puree Puree: Within functional  limits Presentation: Spoon   Solid     Solid: Impaired Oral Phase Impairments: Impaired mastication Oral Phase Functional Implications: Impaired mastication;Oral residue     Sonia Baller, MA, CCC-SLP Speech Therapy [facility]

## 2021-05-03 NOTE — Progress Notes (Addendum)
PROGRESS NOTE    Ryan [name]   MLY:[id number]  DOB: July 12, 1946  PCP: [name], MD    DOA: 04/08/2021 LOS: 25   Assessment & Plan   Principal Problem:   Critical lower limb ischemia ([place]) Active Problems:   Chronic systolic heart failure (HCC)   Status post mitral valve replacement with bioprosthetic valve   Diabetes mellitus ([place])   Essential hypertension   ICD (implantable cardioverter-defibrillator) in place   Stage 3 chronic kidney disease ([place])   Pressure injury of skin   Acute respiratory failure with hypoxia -with increased requirements 8/13-14.  Now on 25 L/min, 100% FiO2 HFNC plus nonrebreather mask. --Supplemental O2 to maintain sats above 90% --Started on DVT prophylaxis which had been on hold due to retroperitoneal hematoma --will do further work-up to rule out VTE prior to starting any full dose anticoagulation since physical exam is most consistent with worsening pneumonia and less likely PE. --Continue diuresis with caution & monitor renal function --Consider V/Q scan or CTA chest to evaluate for PE depending on above and response to diuresis. --Wean O2 as tolerated, maintain sats >90%  Possible aspiration -tube feeds are on hold.   --Speech consulted for swallow evaluation.   --Lower extremity Doppler ultrasounds to evaluate for DVT.  --Continue empiric Zosyn --Follow up sputum culture  --MRSA screen positive, add Vanc  Multifocal pneumonia due to COVID-19  - Chest x-ray showed some opacity initially thought to be fluid or atelectasis but, now Covid+ so more likely infection especially given his fever and cough --Continue supplemental oxygen, keep sats >90% - Continue remdesivir, steroids --Scheduled Mucinex, as needed Tessalon -- I-S, flutter, bronchodilator/inhaler - Follow inflammatory markers and CMP  Poor p.o. intake -dietitian is following.  CorTrak in place. --Hold tube feeds as above, for now, pending respiratory improvement. --Encourage  p.o. intake Body mass index is 26.31 kg/m.  Leukocytosis is on steroids for COVID infection with associated hypoxia.  Now with likely aspiration - WBC rising. --Monitor CBC --Mgmt as above   Right critical lower limb ischemia [facility]): Vascular Surgery-recommends dressing changes. Wound care team consulted Lower extremity Dopplers negative for DVT.   Surgical debridement of the right heel with application of myriad substitute 8/8.   Vascular surgery, Dr. Luan Pulling recommends routine dressing changes.  Outpatient follow-up in 1-2 weeks will be arranged by their service.    Retroperitoneal hematoma Acute blood loss anemia: -CT scan of the abdomen and pelvis showed new retroperitoneal hematoma he was given Vit K & FFP. Repeat CT A/P on 8/5- remains large unchanged except increase in Pleural effusion vs atelactesis.    AKI from ATN on chronic kidney disease stage IIIb: Creatinine peaked at 5.63; Improving.  -Secondary to hypotension and retroperitoneal bleed displacing left kidney - Status post hemodialysis on 8/2  --HD catheter has been removed - Seen by nephro, urology and IR.   -- Follow-up urology outpatient as necessary.   Dr. Reesa Chew spoke with IR, Dr. Earleen Newport 8/8 - continue PCN at least for 2 months, follow-up will be arranged by their team.  Recommend routine drainage of catheter/bag and dry dressing changes.  Chronic systolic heart failure ([place]), EF 40-45% -On Midodrine $RemoveBefor'10mg'XRYQwumUDViB$  po tid.    Echo 03/04/2021-EF 40-45%, global hypokinesis.   Follow-up outpatient CHF.   Hold PO Lasix 20 mg daily (home med) On IV Lasix - monitor response and renal function closely Monitor volume status    Permanent atrial fibrillation/Status post mitral valve replacement with bioprosthetic valve: Bioprosthetic valve. Anticoagulation on  hold.  Amiodarone $RemoveBef'200mg'AbeHNWmDeE$  daily    Diabetes mellitus type 2: Acceptable range.  -Hemoglobin A1c 5.4.  Insulin sliding scale and Accu-Cheks  Hx of Essential HTN but currently  borderline low BP -midodrine 10 mg TID  Hyperlipidemia: -Lipitor 40 mg daily   Ethics/goals of care: Seen by Palliative Care    Patient BMI: Body mass index is 26.31 kg/m.   DVT prophylaxis: enoxaparin (LOVENOX) injection 40 mg Start: 05/03/21 1000   Diet:  Diet Orders (From admission, onward)     Start     Ordered   04/29/21 1436  Diet regular Room service appropriate? Yes; Fluid consistency: Thin; Fluid restriction: 1800 mL Fluid  Diet effective now       Question Answer Comment  Room service appropriate? Yes   Fluid consistency: Thin   Fluid restriction: 1800 mL Fluid      04/29/21 1437              Code Status: Full Code   Brief Narrative / Hospital Course to Date:   "75 y.o. 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  Scheduled to have debridement on 04/08/2021 but due to an INR of 3.2 surgery was postponed.  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  He has completed his course of antibiotics in house last day was 04/13/2019.  He developed a retroperitoneal hematoma with left kidney displacement and hypotension developed ATN and hyperkalemia, nephrology was consulted recommended renal replacement therapy, urology was consulted due to his left hydronephrosis would like recommended a left nephrostomy tube.  Nephrostomy tube placed on 8/1 and central line placement for HD.  Patient did well after 1 HD treatment and renal function recovered.  HD catheter removed.  PT recommended SNF.  Cleared by vascular, 1-2-week follow improvement be arranged by their service.  During screening status  for SNF placement patient tested positive for COVID-19"  Subjective 05/03/21    Pt overnight had significantly increased oxygen requirement.This morning is on 25 L/min 100% FiO2 by HFNC plus nonrebreather.  Started on Zosyn empirically and given IV Lasix with good effect per bedside RN.  When seen this morning, patient somnolent but responsive and follows commands.  RN at bedside trying to flush core track but unable.  Tube feeds on hold for concern for aspiration.   Disposition Plan & Communication   Status is: Inpatient  Remains inpatient appropriate because: COVID-positive with fevers and cough; on 10-day isolation prior to SNF discharge . Worsening respiratory status.  Dispo: The patient is from: Home              Anticipated d/c is to: SNF              Patient currently is not medically stable to d/c.   Difficult to place patient No  Family communication - spoke with patient's son on patient's cell phone during encounter today (8/12). Will attempt to call son this afternoon   Consults, Procedures, Significant Events   Consultants:  Palliative care Vascular surgery Nephrology  Procedures:  As per above narrative  Antimicrobials:  Anti-infectives (From admission, onward)    Start     Dose/Rate Route Frequency Ordered Stop   05/03/21 0200  piperacillin-tazobactam (ZOSYN) IVPB 3.375 g        3.375 g 12.5 mL/hr over 240 Minutes Intravenous Every 8 hours 05/03/21 0118     04/30/21 1000  remdesivir 100 mg in sodium chloride 0.9 % 100 mL IVPB       See Hyperspace for full Linked Orders Report.   100 mg 200 mL/hr over 30 Minutes Intravenous Daily 04/29/21 0732 05/03/21 1045   04/29/21 0900  remdesivir 200 mg in sodium chloride 0.9% 250 mL IVPB       See Hyperspace for full Linked Orders Report.   200 mg 580 mL/hr over 30 Minutes Intravenous Once 04/29/21 0732 04/29/21 1030   04/21/21 0800  cefTRIAXone (ROCEPHIN) 1 g in sodium chloride 0.9 % 100 mL IVPB  Status:   Discontinued        1 g 200 mL/hr over 30 Minutes Intravenous Every 24 hours 04/20/21 1147 04/27/21 1354   04/20/21 1200  cefTRIAXone (ROCEPHIN) 2 g in sodium chloride 0.9 % 100 mL IVPB        2 g 200 mL/hr over 30 Minutes Intravenous To Radiology 04/20/21 1107 04/20/21 1254   04/08/21 2000  cefTRIAXone (ROCEPHIN) 2 g in sodium chloride 0.9 % 100 mL IVPB  Status:  Discontinued       See Hyperspace for full Linked Orders Report.   2 g 200 mL/hr over 30 Minutes Intravenous Every 24 hours 04/08/21 1212 04/13/21 1134   04/08/21 2000  metroNIDAZOLE (FLAGYL) IVPB 500 mg  Status:  Discontinued       See Hyperspace for full Linked Orders Report.   500 mg 100 mL/hr over 60 Minutes Intravenous Every 8 hours 04/08/21 1212 04/13/21 1134   04/08/21 0823  ceFAZolin (ANCEF) IVPB 2g/100 mL premix  Status:  Discontinued        2 g 200 mL/hr over 30 Minutes Intravenous 30 min pre-op 04/08/21 0823 04/08/21 1212         Micro    Objective   Vitals:   05/03/21 0815 05/03/21 0829 05/03/21 1255 05/03/21 1424  BP: 114/72  121/77 130/76  Pulse: (!) 117 (!) 112 (!) 104 70  Resp: (!) 23 (!) $Remo'22 18 20  'MBtyE$ Temp: 97.7 F (36.5 C)  97.6 F (36.4 C)   TempSrc: Oral  Oral   SpO2: (!) 80% 93% 97% 100%  Weight:      Height:        Intake/Output Summary (Last 24 hours) at 05/03/2021 1606 Last data filed at 05/03/2021 1500 Gross per 24 hour  Intake 1.62 ml  Output 2150 ml  Net -2148.38 ml   Filed Weights   05/01/21 0643 05/02/21 0337 05/03/21 0700  Weight: 87.1 kg 88 kg 88 kg    Physical Exam:  General exam: somnolent but responsive, no acute distress HEENT: CorTrak in place not able to be flushed, hearing grossly normal  Respiratory system: diffuse rhonchi R>L, 25 L/min HFNC + NRB mask. Cardiovascular system: normal S1/S2, RRR, trace lower extremity edema.   Gastrointestinal system: soft, NT, ND Central nervous system: A&O x3. no gross focal neurologic deficits, follows commands  Labs   Data  Reviewed: I have personally reviewed following labs and imaging studies  CBC: Recent Labs  Lab 04/28/21 0116 04/30/21 0645 05/01/21 0715 05/02/21 0145 05/03/21 0125  WBC 13.3* 12.5* 15.9* 19.5* 24.7*  HGB 9.7* 9.2* 9.7* 9.9* 10.1*  HCT 31.7* 30.1* 31.1* 31.0* 33.0*  MCV 83.0 82.2 81.0 79.7* 83.1  PLT 299 252 261 230 261  Basic Metabolic Panel: Recent Labs  Lab 04/29/21 0220 04/29/21 1739 04/30/21 0645 04/30/21 2226 05/01/21 0715 05/02/21 0145 05/03/21 0125  NA 144  --  145  --  142 142 141  K 3.8  --  3.8  --  3.8 4.4 5.0  CL 113*  --  114*  --  114* 113* 112*  CO2 20*  --  20*  --  20* 20* 19*  GLUCOSE 84  --  142*  --  199* 185* 142*  BUN 28*  --  35*  --  46* 50* 55*  CREATININE 1.37*  --  1.22  --  1.43* 1.35* 1.28*  CALCIUM 8.3*  --  8.4*  --  8.4* 8.4* 8.5*  MG 1.7  --  1.9  --  1.8 1.9 1.9  PHOS  --  3.1 3.1 3.3  --   --   --    GFR: Estimated Creatinine Clearance: 53 mL/min (A) (by C-G formula based on SCr of 1.28 mg/dL (H)). Liver Function Tests: Recent Labs  Lab 05/01/21 0715 05/02/21 0145 05/03/21 0125  AST 54* 47* 49*  ALT 42 37 37  ALKPHOS 92 94 97  BILITOT 1.2 0.6 1.2  PROT 6.0* 6.1* 6.5  ALBUMIN 2.0* 2.0* 2.2*   No results for input(s): LIPASE, AMYLASE in the last 168 hours. No results for input(s): AMMONIA in the last 168 hours. Coagulation Profile: Recent Labs  Lab 04/27/21 0157 04/28/21 0116 05/01/21 0715  INR 1.4* 1.4* 1.4*   Cardiac Enzymes: No results for input(s): CKTOTAL, CKMB, CKMBINDEX, TROPONINI in the last 168 hours. BNP (last 3 results) Recent Labs    08/25/20 1121 03/27/21 1123  PROBNP 21,395* 3,146*   HbA1C: No results for input(s): HGBA1C in the last 72 hours. CBG: Recent Labs  Lab 05/02/21 1653 05/02/21 2153 05/03/21 0628 05/03/21 0822 05/03/21 1240  GLUCAP 128* 139* 121* 107* 103*   Lipid Profile: No results for input(s): CHOL, HDL, LDLCALC, TRIG, CHOLHDL, LDLDIRECT in the last 72 hours. Thyroid  Function Tests: No results for input(s): TSH, T4TOTAL, FREET4, T3FREE, THYROIDAB in the last 72 hours. Anemia Panel: Recent Labs    05/02/21 0145 05/03/21 0400  FERRITIN 1,143* 1,324*   Sepsis Labs: Recent Labs  Lab 04/29/21 0826 05/02/21 2338  PROCALCITON 0.53 0.49    Recent Results (from the past 240 hour(s))  Resp Panel by RT-PCR (Flu A&B, Covid) Nasopharyngeal Swab     Status: Abnormal   Collection Time: 04/28/21  5:44 PM   Specimen: Nasopharyngeal Swab; Nasopharyngeal(NP) swabs in vial transport medium  Result Value Ref Range Status   SARS Coronavirus 2 by RT PCR POSITIVE (A) NEGATIVE Final    Comment: RESULT CALLED TO, READ BACK BY AND VERIFIED WITHDonetta Potts RN [id number] 04/28/21 A BROWNING (NOTE) SARS-CoV-2 target nucleic acids are DETECTED.  The SARS-CoV-2 RNA is generally detectable in upper respiratory specimens during the acute phase of infection. Positive results are indicative of the presence of the identified virus, but do not rule out bacterial infection or co-infection with other pathogens not detected by the test. Clinical correlation with patient history and other diagnostic information is necessary to determine patient infection status. The expected result is Negative.  Fact Sheet for Patients: EntrepreneurPulse.com.au  Fact Sheet for Healthcare Providers: IncredibleEmployment.be  This test is not yet approved or cleared by the [place] FDA and  has been authorized for detection and/or diagnosis of SARS-CoV-2 by FDA under an Emergency Use Authorization (EUA).  This EUA will  remain in effect (meaning this test can  be used) for the duration of  the COVID-19 declaration under Section 564(b)(1) of the Act, 21 U.S.C. section 360bbb-3(b)(1), unless the authorization is terminated or revoked sooner.     Influenza A by PCR NEGATIVE NEGATIVE Final   Influenza B by PCR NEGATIVE NEGATIVE Final    Comment: (NOTE) The  Xpert Xpress SARS-CoV-2/FLU/RSV plus assay is intended as an aid in the diagnosis of influenza from Nasopharyngeal swab specimens and should not be used as a sole basis for treatment. Nasal washings and aspirates are unacceptable for Xpert Xpress SARS-CoV-2/FLU/RSV testing.  Fact Sheet for Patients: EntrepreneurPulse.com.au  Fact Sheet for Healthcare Providers: IncredibleEmployment.be  This test is not yet approved or cleared by the [place] FDA and has been authorized for detection and/or diagnosis of SARS-CoV-2 by FDA under an Emergency Use Authorization (EUA). This EUA will remain in effect (meaning this test can be used) for the duration of the COVID-19 declaration under Section 564(b)(1) of the Act, 21 U.S.C. section 360bbb-3(b)(1), unless the authorization is terminated or revoked.  Performed at [facility], [place] [address], [place], [place] [address]   MRSA Next Gen by PCR, Nasal     Status: Abnormal   Collection Time: 05/03/21 12:18 AM   Specimen: Nasal Mucosa; Nasal Swab  Result Value Ref Range Status   MRSA by PCR Next Gen POSITIVE (A) NOT DETECTED Final    Comment: CRITICAL RESULT CALLED TO, READ BACK BY AND VERIFIED WITH: RN Corie Chiquito [phone] $RemoveBeforeDEI'[id number]$  THANEY Performed at [facility], [place] [address], [place], [place] [address]       Imaging Studies   DG CHEST PORT 1 VIEW  Result Date: 05/02/2021 CLINICAL DATA:  Hypoxia EXAM: PORTABLE CHEST 1 VIEW COMPARISON:  04/26/2021 FINDINGS: Cardiac shadow is enlarged but stable. Defibrillator and postsurgical changes are seen. Feeding catheter is noted extending into the stomach. Lungs are well aerated with diffuse airspace opacity throughout the right lung as well as the left lung base increased in the interval from the prior exam. IMPRESSION: Developing pneumonic infiltrates bilaterally right greater than left as described. Electronically Signed   By: Inez Catalina M.D.   On:  05/02/2021 23:38     Medications   Scheduled Meds:  amiodarone  200 mg Oral Daily   vitamin C  500 mg Oral Daily   atorvastatin  40 mg Oral q1800   dexamethasone  6 mg Oral Daily   dextromethorphan-guaiFENesin  1 tablet Oral BID   docusate sodium  100 mg Oral BID   enoxaparin (LOVENOX) injection  40 mg Subcutaneous Daily   feeding supplement  237 mL Oral BID BM   feeding supplement (PROSource TF)  45 mL Per Tube TID   furosemide  40 mg Intravenous Q12H   furosemide  20 mg Oral Daily   insulin aspart  0-15 Units Subcutaneous TID WC   Ipratropium-Albuterol  1 puff Inhalation Q6H   midodrine  10 mg Oral TID WC   multivitamin with minerals  1 tablet Oral Daily   pantoprazole  40 mg Oral Daily   sodium chloride flush  3 mL Intravenous Q12H   zinc sulfate  220 mg Oral Daily   Continuous Infusions:  feeding supplement (OSMOLITE 1.5 CAL) 1,200 mL (05/02/21 1702)   piperacillin-tazobactam (ZOSYN)  IV 3.375 g (05/03/21 1443)       LOS: 25 days    Time spent: 50 minutes with > 50% spent at bedside and  in coordination of care      Ezekiel Slocumb, DO [facility]  05/03/2021, 4:06 PM      If 7PM-7AM, please contact night-coverage. How to contact the [facility] Attending or Consulting provider [place] or covering provider during after hours [place], for this patient?    Check the care team in [facility] and look for a) attending/consulting [facility] provider listed and b) the [facility] team listed Log into [email or web address] and use [place]'s universal password to access. If you do not have the password, please contact the hospital operator. Locate the [facility] provider you are looking for under [facility] and page to a number that you can be directly reached. If you still have difficulty reaching the provider, please page the [facility] (Director on Call) for the Hospitalists listed on amion for assistance.

## 2021-05-03 NOTE — Progress Notes (Signed)
Pharmacy Antibiotic Note  Ryan [name] is a 75 y.o. male admitted on 04/08/2021 with pneumonia.  Pharmacy has been consulted for Zosyn dosing for ?aspiration PNA vs secondary bacterial PNA in setting of COVID-19. WBC is increasing. Developing infiltrates on CXR. Noted renal dysfunction.   Plan: Zosyn 3.375G IV q8h to be infused over 4 hours Trend WBC, temp, renal function  F/U infectious work-up Low threshold to add MRSA coverage with clinical deterioration   Height: 6' (182.9 cm) Weight: 88 kg (194 lb) IBW/kg (Calculated) : 77.6  Temp (24hrs), Avg:98.1 F (36.7 C), Min:97.6 F (36.4 C), Max:98.7 F (37.1 C)  Recent Labs  Lab 04/27/21 0157 04/28/21 0116 04/29/21 0220 04/30/21 0645 05/01/21 0715 05/02/21 0145  WBC 12.9* 13.3*  --  12.5* 15.9* 19.5*  CREATININE 1.40* 1.36* 1.37* 1.22 1.43* 1.35*    Estimated Creatinine Clearance: 50.3 mL/min (A) (by C-G formula based on SCr of 1.35 mg/dL (H)).    Allergies  Allergen Reactions   Geralyn Flash [Fish Allergy] Nausea And Vomiting    Narda Bonds, PharmD, BCPS Clinical Pharmacist Phone: [phone]

## 2021-05-04 ENCOUNTER — Inpatient Hospital Stay (HOSPITAL_COMMUNITY): Payer: Medicare HMO

## 2021-05-04 ENCOUNTER — Inpatient Hospital Stay: Payer: Self-pay

## 2021-05-04 DIAGNOSIS — R609 Edema, unspecified: Secondary | ICD-10-CM | POA: Diagnosis not present

## 2021-05-04 DIAGNOSIS — Z515 Encounter for palliative care: Secondary | ICD-10-CM | POA: Diagnosis not present

## 2021-05-04 DIAGNOSIS — U071 COVID-19: Secondary | ICD-10-CM

## 2021-05-04 DIAGNOSIS — I70229 Atherosclerosis of native arteries of extremities with rest pain, unspecified extremity: Secondary | ICD-10-CM | POA: Diagnosis not present

## 2021-05-04 DIAGNOSIS — R0902 Hypoxemia: Secondary | ICD-10-CM

## 2021-05-04 DIAGNOSIS — I82409 Acute embolism and thrombosis of unspecified deep veins of unspecified lower extremity: Secondary | ICD-10-CM

## 2021-05-04 DIAGNOSIS — Z7189 Other specified counseling: Secondary | ICD-10-CM | POA: Diagnosis not present

## 2021-05-04 LAB — COMPREHENSIVE METABOLIC PANEL
ALT: 29 U/L (ref 0–44)
AST: 41 U/L (ref 15–41)
Albumin: 1.9 g/dL — ABNORMAL LOW (ref 3.5–5.0)
Alkaline Phosphatase: 89 U/L (ref 38–126)
Anion gap: 9 (ref 5–15)
BUN: 60 mg/dL — ABNORMAL HIGH (ref 8–23)
CO2: 22 mmol/L (ref 22–32)
Calcium: 8.5 mg/dL — ABNORMAL LOW (ref 8.9–10.3)
Chloride: 111 mmol/L (ref 98–111)
Creatinine, Ser: 1.45 mg/dL — ABNORMAL HIGH (ref 0.61–1.24)
GFR, Estimated: 50 mL/min — ABNORMAL LOW (ref >60)
Glucose, Bld: 113 mg/dL — ABNORMAL HIGH (ref 70–99)
Potassium: 5 mmol/L (ref 3.5–5.1)
Sodium: 142 mmol/L (ref 135–145)
Total Bilirubin: 1.1 mg/dL (ref 0.3–1.2)
Total Protein: 5.8 g/dL — ABNORMAL LOW (ref 6.5–8.1)

## 2021-05-04 LAB — CBC
HCT: 32.5 % — ABNORMAL LOW (ref 39.0–52.0)
Hemoglobin: 9.9 g/dL — ABNORMAL LOW (ref 13.0–17.0)
MCH: 24.5 pg — ABNORMAL LOW (ref 26.0–34.0)
MCHC: 30.5 g/dL (ref 30.0–36.0)
MCV: 80.4 fL (ref 80.0–100.0)
Platelets: 212 10*3/uL (ref 150–400)
RBC: 4.04 MIL/uL — ABNORMAL LOW (ref 4.22–5.81)
RDW: 22.5 % — ABNORMAL HIGH (ref 11.5–15.5)
WBC: 17.5 10*3/uL — ABNORMAL HIGH (ref 4.0–10.5)
nRBC: 0.2 % (ref 0.0–0.2)

## 2021-05-04 LAB — GLUCOSE, CAPILLARY
Glucose-Capillary: 101 mg/dL — ABNORMAL HIGH (ref 70–99)
Glucose-Capillary: 126 mg/dL — ABNORMAL HIGH (ref 70–99)
Glucose-Capillary: 133 mg/dL — ABNORMAL HIGH (ref 70–99)
Glucose-Capillary: 130 mg/dL — ABNORMAL HIGH (ref 70–99)

## 2021-05-04 LAB — MAGNESIUM: Magnesium: 2 mg/dL (ref 1.7–2.4)

## 2021-05-04 LAB — FERRITIN: Ferritin: 1135 ng/mL — ABNORMAL HIGH (ref 24–336)

## 2021-05-04 LAB — D-DIMER, QUANTITATIVE: D-Dimer, Quant: 12.08 ug/mL-FEU — ABNORMAL HIGH (ref 0.00–0.50)

## 2021-05-04 LAB — C-REACTIVE PROTEIN: CRP: 20.4 mg/dL — ABNORMAL HIGH (ref <1.0)

## 2021-05-04 MED ORDER — VANCOMYCIN HCL 1250 MG/250ML IV SOLN
1250.0000 mg | INTRAVENOUS | Status: DC
  Administered 2021-05-04 – 2021-05-05 (×2): 1250 mg via INTRAVENOUS
  Filled 2021-05-04 (×3): qty 250

## 2021-05-04 MED ORDER — SODIUM CHLORIDE 0.9% FLUSH
10.0000 mL | INTRAVENOUS | Status: DC | PRN
  Administered 2021-05-14: 10 mL

## 2021-05-04 MED ORDER — CHLORHEXIDINE GLUCONATE CLOTH 2 % EX PADS
6.0000 | MEDICATED_PAD | Freq: Every day | CUTANEOUS | Status: DC
  Administered 2021-05-04 – 2021-05-22 (×19): 6 via TOPICAL

## 2021-05-04 MED ORDER — IPRATROPIUM-ALBUTEROL 20-100 MCG/ACT IN AERS
1.0000 | INHALATION_SPRAY | Freq: Two times a day (BID) | RESPIRATORY_TRACT | Status: DC
  Administered 2021-05-04 – 2021-05-08 (×8): 1 via RESPIRATORY_TRACT

## 2021-05-04 MED ORDER — SODIUM CHLORIDE 0.9% FLUSH
10.0000 mL | Freq: Two times a day (BID) | INTRAVENOUS | Status: DC
  Administered 2021-05-04 – 2021-05-11 (×13): 10 mL
  Administered 2021-05-11: 20 mL
  Administered 2021-05-12: 10 mL
  Administered 2021-05-12: 40 mL
  Administered 2021-05-13: 30 mL
  Administered 2021-05-13: 10 mL
  Administered 2021-05-14: 20 mL
  Administered 2021-05-15 – 2021-05-18 (×8): 10 mL

## 2021-05-04 MED ORDER — HEPARIN (PORCINE) 25000 UT/250ML-% IV SOLN
1750.0000 [IU]/h | INTRAVENOUS | Status: DC
  Administered 2021-05-04: 1400 [IU]/h via INTRAVENOUS
  Administered 2021-05-07: 1650 [IU]/h via INTRAVENOUS
  Administered 2021-05-08 – 2021-05-09 (×2): 1700 [IU]/h via INTRAVENOUS
  Administered 2021-05-10 – 2021-05-12 (×2): 1750 [IU]/h via INTRAVENOUS
  Filled 2021-05-04 (×13): qty 250

## 2021-05-04 NOTE — Progress Notes (Addendum)
Left upper extremity venous duplex, IVC/iliac vein duplex, and bilateral lower extremity venous duplex completed. Refer to "CV Proc" under chart review to view preliminary results.  Preliminary results discussed with Dr. Arbutus Ped.  05/04/2021 3:01 PM Kelby Aline., MHA, RVT, RDCS, RDMS

## 2021-05-04 NOTE — Progress Notes (Signed)
Ryan [name] is a 75 y.o. male with critical limb ischemia of the right lower extremity. His right foot continues to deteriorate despite local wound care and a functioning bypass. Unfortunately, his only option from my standpoint is a right above knee amputation. Recommend goals of care discussion with palliative care.    [name]. [name], MD [facility] Phone Number: [phone] 05/04/2021 1:40 PM

## 2021-05-04 NOTE — Progress Notes (Addendum)
ANTICOAGULATION CONSULT NOTE - Initial Consult  Pharmacy Consult for IV Heparin Indication:  VTE Treatment  Allergies  Allergen Reactions   Tuna [Fish Allergy] Nausea And Vomiting    Patient Measurements: Height: 6' (182.9 cm) Weight: 88 kg (194 lb 0.1 oz) IBW/kg (Calculated) : 77.6 Heparin Dosing Weight:  88 kg  Vital Signs: Temp: 98.2 F (36.8 C) (08/15 0429) Temp Source: Oral (08/15 0429) BP: 121/78 (08/15 0434) Pulse Rate: 72 (08/15 1359)  Labs: Recent Labs    05/02/21 0145 05/03/21 0125 05/04/21 0500  HGB 9.9* 10.1* 9.9*  HCT 31.0* 33.0* 32.5*  PLT 230 261 212  CREATININE 1.35* 1.28* 1.45*    Estimated Creatinine Clearance: 46.8 mL/min (A) (by C-G formula based on SCr of 1.45 mg/dL (H)).   Medical History: Past Medical History:  Diagnosis Date   AICD (automatic cardioverter/defibrillator) present    Arthritis    Atrial fibrillation (HCC)    Cardiomyopathy, dilated (HCC)    CHF (congestive heart failure) (HCC)    Diabetes mellitus without complication ([place])    Endocarditis    Hypertension    Peripheral vascular disease (HCC)    PVC's (premature ventricular contractions)    SVT (supraventricular tachycardia)  long RP     Assessment: 75 yr old man admitted on 04/08/21 with critical limb ischemic of RLE. Pt with acute respiratory failure with hypoxia with increased oxygen requirements 8/13-8/14. DVT prophylaxis had been on hold due to recent retroperitoneal hematoma, for which he was given vit K and FFP (repeat CT on 8/5: remains unchanged). Pt started on Lovenox for VTE prophylaxis on 8/14 (last dose ~0800 this AM). Pt also with permanent atrial fibrillation, also with mitral valve replacement with bioprosthetic valve (on warfarin PTA for afib, on hold). Also recent dx of COVID infection.  Dopplers today (8/15): findings consistent with acute DVTs bilaterally and L subclavian vein, L axillary vein, L brachial veins, L radial veins, L ulnar veins, L basilic  vein. VQ scan has been ordered. Pharmacy is consulted to dose IV heparin for VTE treatment; no boluses, per Vascular Surgery.  H/H 9.9/32.5, plt 212 (CBC ~stable). D-dimers elevated since 8/10; 12.08 today. 8/12 INR 1.4. CrCl ~47 ml/min. Per RN, no bleeding issues observed; IV team is at bedside now to start IV (previous IV infiltrated).  Goal of Therapy:  Heparin 0.3-0.5 units/ml (will aim for lower end of goal range, due to recent hx retroperitoneal hematoma) Monitor platelets by anticoagulation protocol: Yes   Plan:  Start heparin infusion at 1400 units/hr (no boluses, per Vascular Surgery) Check heparin level in 8 hrs Monitor daily heparin level, CBC Monitor closely for bleeding F/U VQ scan  Gillermina Hu, PharmD, BCPS, [facility] Clinical Pharmacist 05/04/2021,3:33 PM

## 2021-05-04 NOTE — Progress Notes (Signed)
PT Cancellation Note  Patient Details Name: Ryan [name] MRN: [id number] DOB: 18-Feb-1946   Cancelled Treatment:    Reason Eval/Treat Not Completed: (P) Medical issues which prohibited therapy (pt with acute DVT in multiple extremities and per RN concern for PE, medical hold today.) Will plan to continue efforts per PT POC next date if medically appropriate.   Rosealie Reach M Jerrik Housholder 05/04/2021, 4:30 PM

## 2021-05-04 NOTE — Progress Notes (Signed)
SLP Cancellation Note  Patient Details Name: Ryan [name] MRN: [id number] DOB: [birth year]   Cancelled treatment:       Reason Eval/Treat Not Completed: Patient at procedure or test/unavailable   Elvina Sidle, M.S., CCC-SLP 05/04/2021, 11:16 AM

## 2021-05-04 NOTE — Progress Notes (Signed)
Pharmacy Antibiotic Note  Ryan [name] is a 75 y.o. male admitted on 04/08/2021 with pneumonia.  Pharmacy has been consulted for Zosyn dosing for ?aspiration PNA vs secondary bacterial PNA in setting of COVID-19. WBC is increasing. Developing infiltrates on CXR. MRSA PCR positive so vancomycin to be added.  Plan: Continue Zosyn 3.375g IV EI q8h Add vancomycin '1250mg'$  IV q24h - est AUC 456 Follow Cr closely   Height: 6' (182.9 cm) Weight: 88 kg (194 lb 0.1 oz) IBW/kg (Calculated) : 77.6  Temp (24hrs), Avg:98.2 F (36.8 C), Min:97.6 F (36.4 C), Max:98.6 F (37 C)  Recent Labs  Lab 04/30/21 0645 05/01/21 0715 05/02/21 0145 05/03/21 0125 05/04/21 0500  WBC 12.5* 15.9* 19.5* 24.7* 17.5*  CREATININE 1.22 1.43* 1.35* 1.28* 1.45*     Estimated Creatinine Clearance: 46.8 mL/min (A) (by C-G formula based on SCr of 1.45 mg/dL (H)).    Allergies  Allergen Reactions   Geralyn Flash [Fish Allergy] Nausea And Vomiting    Arrie Senate, PharmD, BCPS, [facility] Clinical Pharmacist [phone] Please check AMION for all [place] numbers 05/04/2021

## 2021-05-04 NOTE — Care Management Important Message (Signed)
Important Message  Patient Details  Name: Ryan [name] MRN: [id number] Date of Birth: 04-04-1946   Medicare Important Message Given:  Yes     Shelda Altes 05/04/2021, 9:43 AM

## 2021-05-04 NOTE — Progress Notes (Signed)
Peripherally Inserted Central Catheter Placement  The IV Nurse has discussed with the patient and/or persons authorized to consent for the patient, the purpose of this procedure and the potential benefits and risks involved with this procedure.  The benefits include less needle sticks, lab draws from the catheter, and the patient may be discharged home with the catheter. Risks include, but not limited to, infection, bleeding, blood clot (thrombus formation), and puncture of an artery; nerve damage and irregular heartbeat and possibility to perform a PICC exchange if needed/ordered by physician.  Alternatives to this procedure were also discussed.  Bard Power PICC patient education guide, fact sheet on infection prevention and patient information card has been provided to patient /or left at bedside.    PICC Placement Documentation  PICC Double Lumen 05/04/21 PICC Right Brachial 40 cm 0 cm (Active)  Indication for Insertion or Continuance of Line Limited venous access - need for IV therapy >5 days (PICC only) 05/04/21 1838  Exposed Catheter (cm) 0 cm 05/04/21 1838  Site Assessment Clean;Dry;Intact 05/04/21 1838  Lumen #1 Status Flushed;Saline locked;Blood return noted 05/04/21 1838  Lumen #2 Status Flushed;Saline locked;Blood return noted 05/04/21 1838  Dressing Type Transparent 05/04/21 1838  Dressing Status Clean;Dry;Intact 05/04/21 1838  Antimicrobial disc in place? Yes 05/04/21 1838  Dressing Intervention New dressing;Other (Comment) 05/04/21 1838  Dressing Change Due 05/11/21 05/04/21 1838       Christella Noa Albarece 05/04/2021, 6:39 PM

## 2021-05-04 NOTE — Progress Notes (Signed)
PROGRESS NOTE    Ryan [name]   KPV:[id number]  DOB: 08/29/1946  PCP: [name], MD    DOA: 04/08/2021 LOS: 26   Assessment & Plan   Principal Problem:   Critical lower limb ischemia ([place]) Active Problems:   Chronic systolic heart failure (HCC)   Status post mitral valve replacement with bioprosthetic valve   Diabetes mellitus ([place])   Essential hypertension   ICD (implantable cardioverter-defibrillator) in place   Stage 3 chronic kidney disease ([place])   Pressure injury of skin   Acute respiratory failure with hypoxia -with increased requirements 8/13-14, has been on 25 L/min, 100% FiO2 HFNC plus nonrebreather mask - sats in high 90's-100%. Diuresed with IV Lasix and good UO, improved lower extremity edema.  Has acute DVT's and below, so very likely acute PE. --Supplemental O2 to maintain sats above 90% --Start on heparin drip NO BOLUS --Stop diuresis, mild bump in Cr (8/15) --V/Q scan pending.  (Avoiding CTA due to renal function and severe AKI earlier admission required dialysis) --Wean O2 as tolerated, maintain sats >90%  Acute DVT of Left Upper Extremity LUE Doppler U/S with acute DVT involving L subclavian vein, L axillary vein, L brachial veins, L radial and ulnar veins  Likely Acute PE given profound O2 requirements --Start heparin NO BOLUS --Discussed with vascular surgery - very unlikely any intervention as would be extremely high risk. Anticoagulate. --Monitor Hbg and hemodynamics closely as pt has an RP hematoma and had intramuscular hemorrhage earlier this admission.  Possible aspiration -tube feeds are on hold.   --Speech consulted for swallow evaluation.   --Continue empiric Zosyn --Continue Vancomycin (MRSA screen positive) --Follow up sputum culture   Multifocal pneumonia due to COVID-19  - Chest x-ray showed some opacity initially thought to be fluid or atelectasis but, now Covid+ so more likely infection especially given his fever and  cough --Continue supplemental oxygen, keep sats >90% - Completed remdesivir --Continue steroids --Scheduled Mucinex, as needed Tessalon -- I-S, flutter, bronchodilator/inhaler - Follow inflammatory markers and CMP  Poor p.o. intake -dietitian is following.  CorTrak in place. --Hold tube feeds as above, for now, pending respiratory improvement. --Encourage p.o. intake Body mass index is 26.31 kg/m.  Leukocytosis is on steroids for COVID infection with associated hypoxia.  Now with likely aspiration - WBC rising. --Monitor CBC --Mgmt as above   Right critical lower limb ischemia [facility]): Vascular Surgery-recommends dressing changes. Wound care team consulted Lower extremity Dopplers negative for DVT.   Surgical debridement of the right heel with application of myriad substitute 8/8.   --Vascular surgery, Dr. [name] following --Needs Right AKA as the right foot is not healing    Retroperitoneal hematoma Acute blood loss anemia: -CT scan of the abdomen and pelvis showed new retroperitoneal hematoma he was given Vit K & FFP. Repeat CT A/P on 8/5- remains large unchanged except increase in Pleural effusion vs atelactesis.    AKI from ATN on chronic kidney disease stage IIIb: Creatinine peaked at 5.63; Improving.  -Secondary to hypotension and retroperitoneal bleed displacing left kidney - Status post hemodialysis on 8/2  --HD catheter has been removed - Seen by nephro, urology and IR.   -- Follow-up urology outpatient as necessary.   Dr. Reesa Chew spoke with IR, Dr. Earleen Newport 8/8 - continue PCN at least for 2 months, follow-up will be arranged by their team.  Recommend routine drainage of catheter/bag and dry dressing changes.  Chronic systolic heart failure ([place]), EF 40-45% -On Midodrine $RemoveBefor'10mg'[id number]$  po tid.  Echo 03/04/2021-EF 40-45%, global hypokinesis.   Follow-up outpatient CHF.   Hold PO Lasix 20 mg daily (home med) On IV Lasix - monitor response and renal function closely Monitor volume  status    Permanent atrial fibrillation/Status post mitral valve replacement with bioprosthetic valve: Bioprosthetic valve. Anticoagulation on hold.  Amiodarone $RemoveBef'200mg'[id number]$  daily    Diabetes mellitus type 2: Acceptable range.  -Hemoglobin A1c 5.4.  Insulin sliding scale and Accu-Cheks  Hx of Essential HTN but currently borderline low BP -midodrine 10 mg TID  Hyperlipidemia: -Lipitor 40 mg daily   Ethics/goals of care: Seen by Palliative Care    Patient BMI: Body mass index is 26.31 kg/m.   DVT prophylaxis: enoxaparin (LOVENOX) injection 40 mg Start: 05/03/21 1000   Diet:  Diet Orders (From admission, onward)     Start     Ordered   04/29/21 1436  Diet regular Room service appropriate? Yes; Fluid consistency: Thin; Fluid restriction: 1800 mL Fluid  Diet effective now       Question Answer Comment  Room service appropriate? Yes   Fluid consistency: Thin   Fluid restriction: 1800 mL Fluid      04/29/21 1437              Code Status: Full Code   Brief Narrative / Hospital Course to Date:   "75 y.o. 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  Scheduled to have debridement on 04/08/2021 but due to an INR of 3.2 surgery was postponed.  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  He has completed his course of antibiotics in house last day was 04/13/2019.  He developed a retroperitoneal hematoma with left kidney displacement and hypotension developed ATN and hyperkalemia, nephrology was consulted recommended renal replacement therapy, urology was consulted due to his left hydronephrosis would  like recommended a left nephrostomy tube.  Nephrostomy tube placed on 8/1 and central line placement for HD.  Patient did well after 1 HD treatment and renal function recovered.  HD catheter removed.  PT recommended SNF.  Cleared by vascular, 1-2-week follow improvement be arranged by their service.  During screening status for SNF placement patient tested positive for COVID-19"  Subjective 05/04/21    Pt says he is feeling better today.  Asking to be pulled up in the bed.  Cough is improved.  Remains on 25 L 100% FiO2 but sats appear to be 98 to 100%.  He says he wants to get out of bed.   Disposition Plan & Communication   Status is: Inpatient  Remains inpatient appropriate because: Severity of illness with high oxygen requirements and ongoing evaluation.  On IV heparin.  Dispo: The patient is from: Home              Anticipated d/c is to: SNF              Patient currently is not medically stable to d/c.   Difficult to place patient No  Family communication - spoke with patient's son on patient's cell phone during encounter today (8/12). Will attempt to call son this afternoon   Consults, Procedures, Significant Events   Consultants:  Palliative care Vascular surgery Nephrology  Procedures:  As per above narrative  Antimicrobials:  Anti-infectives (From admission, onward)    Start     Dose/Rate Route Frequency Ordered Stop   05/04/21 0930  vancomycin (VANCOREADY) IVPB 1250 mg/250 mL        1,250 mg 166.7 mL/hr over 90 Minutes Intravenous Every 24 hours 05/04/21 0830     05/03/21 0200  piperacillin-tazobactam (ZOSYN) IVPB 3.375 g        3.375 g 12.5 mL/hr over 240 Minutes Intravenous Every 8 hours 05/03/21 0118     04/30/21 1000  remdesivir 100 mg in sodium chloride 0.9 % 100 mL IVPB       See Hyperspace for full Linked Orders Report.   100 mg 200 mL/hr over 30 Minutes Intravenous Daily 04/29/21 0732 05/03/21 1045   04/29/21 0900  remdesivir 200 mg in sodium chloride  0.9% 250 mL IVPB       See Hyperspace for full Linked Orders Report.   200 mg 580 mL/hr over 30 Minutes Intravenous Once 04/29/21 0732 04/29/21 1030   04/21/21 0800  cefTRIAXone (ROCEPHIN) 1 g in sodium chloride 0.9 % 100 mL IVPB  Status:  Discontinued        1 g 200 mL/hr over 30 Minutes Intravenous Every 24 hours 04/20/21 1147 04/27/21 1354   04/20/21 1200  cefTRIAXone (ROCEPHIN) 2 g in sodium chloride 0.9 % 100 mL IVPB        2 g 200 mL/hr over 30 Minutes Intravenous To Radiology 04/20/21 1107 04/20/21 1254   04/08/21 2000  cefTRIAXone (ROCEPHIN) 2 g in sodium chloride 0.9 % 100 mL IVPB  Status:  Discontinued       See Hyperspace for full Linked Orders Report.   2 g 200 mL/hr over 30 Minutes Intravenous Every 24 hours 04/08/21 1212 04/13/21 1134   04/08/21 2000  metroNIDAZOLE (FLAGYL) IVPB 500 mg  Status:  Discontinued       See Hyperspace for full Linked Orders Report.   500 mg 100 mL/hr over 60 Minutes Intravenous Every 8 hours 04/08/21 1212 04/13/21 1134   04/08/21 0823  ceFAZolin (ANCEF) IVPB 2g/100 mL premix  Status:  Discontinued        2 g 200 mL/hr over 30 Minutes Intravenous 30 min pre-op 04/08/21 0823 04/08/21 1212         Micro    Objective   Vitals:   05/04/21 0429 05/04/21 0434 05/04/21 0828 05/04/21 1359  BP: 121/78 121/78    Pulse: 67 67 71 72  Resp: $Remo'16 18 18 17  'RVAbP$ Temp: 98.2 F (36.8 C)     TempSrc: Oral     SpO2: 100%  100% 98%  Weight:      Height:        Intake/Output Summary (Last 24 hours) at 05/04/2021 1550 Last data filed at 05/04/2021 0610 Gross per 24 hour  Intake 129.14 ml  Output 2200 ml  Net -2070.86 ml   Filed Weights   05/01/21 0643 05/02/21 0337 05/03/21 0700  Weight: 87.1 kg 88 kg 88 kg    Physical Exam:  General exam: somnolent but responsive, no acute distress HEENT: CorTrak in place not able to be flushed, hearing grossly normal  Respiratory system: Rhonchi have resolved, lungs today sound clear with diminished bases, poor  inspiratory effort, 25 L/min HFNC + NRB mask. Cardiovascular system: normal S1/S2, RRR Gastrointestinal system: soft, NT, ND Central nervous system: A&O x3. no gross focal neurologic deficits, normal speech Extremities -severe pitting edema of the entire left upper  extremity, trace to no edema in the lower extremities, right foot dressing is clean dry and intact Psychiatric: Normal mood, congruent affect, judgment and insight appear normal  Labs   Data Reviewed: I have personally reviewed following labs and imaging studies  CBC: Recent Labs  Lab 04/30/21 0645 05/01/21 0715 05/02/21 0145 05/03/21 0125 05/04/21 0500  WBC 12.5* 15.9* 19.5* 24.7* 17.5*  HGB 9.2* 9.7* 9.9* 10.1* 9.9*  HCT 30.1* 31.1* 31.0* 33.0* 32.5*  MCV 82.2 81.0 79.7* 83.1 80.4  PLT 252 261 230 261 366   Basic Metabolic Panel: Recent Labs  Lab 04/29/21 1739 04/30/21 0645 04/30/21 2226 05/01/21 0715 05/02/21 0145 05/03/21 0125 05/04/21 0500  NA  --  145  --  142 142 141 142  K  --  3.8  --  3.8 4.4 5.0 5.0  CL  --  114*  --  114* 113* 112* 111  CO2  --  20*  --  20* 20* 19* 22  GLUCOSE  --  142*  --  199* 185* 142* 113*  BUN  --  35*  --  46* 50* 55* 60*  CREATININE  --  1.22  --  1.43* 1.35* 1.28* 1.45*  CALCIUM  --  8.4*  --  8.4* 8.4* 8.5* 8.5*  MG  --  1.9  --  1.8 1.9 1.9 2.0  PHOS 3.1 3.1 3.3  --   --   --   --    GFR: Estimated Creatinine Clearance: 46.8 mL/min (A) (by C-G formula based on SCr of 1.45 mg/dL (H)). Liver Function Tests: Recent Labs  Lab 05/01/21 0715 05/02/21 0145 05/03/21 0125 05/04/21 0500  AST 54* 47* 49* 41  ALT 42 37 37 29  ALKPHOS 92 94 97 89  BILITOT 1.2 0.6 1.2 1.1  PROT 6.0* 6.1* 6.5 5.8*  ALBUMIN 2.0* 2.0* 2.2* 1.9*   No results for input(s): LIPASE, AMYLASE in the last 168 hours. No results for input(s): AMMONIA in the last 168 hours. Coagulation Profile: Recent Labs  Lab 04/28/21 0116 05/01/21 0715  INR 1.4* 1.4*   Cardiac Enzymes: No results for  input(s): CKTOTAL, CKMB, CKMBINDEX, TROPONINI in the last 168 hours. BNP (last 3 results) Recent Labs    08/25/20 1121 03/27/21 1123  PROBNP 21,395* 3,146*   HbA1C: No results for input(s): HGBA1C in the last 72 hours. CBG: Recent Labs  Lab 05/03/21 1240 05/03/21 1740 05/03/21 2044 05/04/21 0755 05/04/21 1152  GLUCAP 103* 138* 119* 101* 126*   Lipid Profile: No results for input(s): CHOL, HDL, LDLCALC, TRIG, CHOLHDL, LDLDIRECT in the last 72 hours. Thyroid Function Tests: No results for input(s): TSH, T4TOTAL, FREET4, T3FREE, THYROIDAB in the last 72 hours. Anemia Panel: Recent Labs    05/03/21 0400 05/04/21 0500  FERRITIN 1,324* 1,135*   Sepsis Labs: Recent Labs  Lab 04/29/21 0826 05/02/21 2338  PROCALCITON 0.53 0.49    Recent Results (from the past 240 hour(s))  Resp Panel by RT-PCR (Flu A&B, Covid) Nasopharyngeal Swab     Status: Abnormal   Collection Time: 04/28/21  5:44 PM   Specimen: Nasopharyngeal Swab; Nasopharyngeal(NP) swabs in vial transport medium  Result Value Ref Range Status   SARS Coronavirus 2 by RT PCR POSITIVE (A) NEGATIVE Final    Comment: RESULT CALLED TO, READ BACK BY AND VERIFIED WITHDonetta Potts RN [id number] 04/28/21 A BROWNING (NOTE) SARS-CoV-2 target nucleic acids are DETECTED.  The SARS-CoV-2 RNA is generally detectable in upper respiratory specimens during the acute phase of  infection. Positive results are indicative of the presence of the identified virus, but do not rule out bacterial infection or co-infection with other pathogens not detected by the test. Clinical correlation with patient history and other diagnostic information is necessary to determine patient infection status. The expected result is Negative.  Fact Sheet for Patients: EntrepreneurPulse.com.au  Fact Sheet for Healthcare Providers: IncredibleEmployment.be  This test is not yet approved or cleared by the [place] FDA and   has been authorized for detection and/or diagnosis of SARS-CoV-2 by FDA under an Emergency Use Authorization (EUA).  This EUA will remain in effect (meaning this test can  be used) for the duration of  the COVID-19 declaration under Section 564(b)(1) of the Act, 21 U.S.C. section 360bbb-3(b)(1), unless the authorization is terminated or revoked sooner.     Influenza A by PCR NEGATIVE NEGATIVE Final   Influenza B by PCR NEGATIVE NEGATIVE Final    Comment: (NOTE) The Xpert Xpress SARS-CoV-2/FLU/RSV plus assay is intended as an aid in the diagnosis of influenza from Nasopharyngeal swab specimens and should not be used as a sole basis for treatment. Nasal washings and aspirates are unacceptable for Xpert Xpress SARS-CoV-2/FLU/RSV testing.  Fact Sheet for Patients: EntrepreneurPulse.com.au  Fact Sheet for Healthcare Providers: IncredibleEmployment.be  This test is not yet approved or cleared by the [place] FDA and has been authorized for detection and/or diagnosis of SARS-CoV-2 by FDA under an Emergency Use Authorization (EUA). This EUA will remain in effect (meaning this test can be used) for the duration of the COVID-19 declaration under Section 564(b)(1) of the Act, 21 U.S.C. section 360bbb-3(b)(1), unless the authorization is terminated or revoked.  Performed at [facility], [place] [address] [place], [place] [address]   MRSA Next Gen by PCR, Nasal     Status: Abnormal   Collection Time: 05/03/21 12:18 AM   Specimen: Nasal Mucosa; Nasal Swab  Result Value Ref Range Status   MRSA by PCR Next Gen POSITIVE (A) NOT DETECTED Final    Comment: CRITICAL RESULT CALLED TO, READ BACK BY AND VERIFIED WITH: RN Corie Chiquito [id number] $RemoveBeforeDEI'[id number]$  THANEY Performed at [facility], [place] [address] [place], [place] [address]       Imaging Studies   VAS [place] IVC/ILIAC (VENOUS ONLY)  Result Date: 05/04/2021 IVC/ILIAC STUDY Patient Name:  MADDOCK FINIGAN  Date of Exam:   05/04/2021 Medical Rec #: [id number]         Accession #:    [id number] Date of Birth: 04-09-1946         Patient Gender: M Patient Age:   75 years Exam Location:  [facility] Procedure:      VAS [place] IVC/ILIAC (VENOUS ONLY) Referring Phys: --------------------------------------------------------------------------------  Indications: DVT seen on lower extremity venous duplex Other Factors: COVID. Limitations: Air/bowel gas, obesity and hematoma at midline/along left iliacus muscle, seen on CT.  Comparison Study: No prior study Performing Technologist: Maudry Mayhew MHA, RDMS, RVT, RDCS  Examination Guidelines: A complete evaluation includes B-mode imaging, spectral Doppler, color Doppler, and power Doppler as needed of all accessible portions of each vessel. Bilateral testing is considered an integral part of a complete examination. Limited examinations for reoccurring indications may be performed as noted.  +----------------+---------+-----------+---------+-----------+----------------+       CIV       RT-PatentRT-ThrombusLT-PatentLT-Thrombus    Comments     +----------------+---------+-----------+---------+-----------+----------------+ Common Iliac  patent                Unable to     Prox                                                    visualize right                                                            proximal CIV   +----------------+---------+-----------+---------+-----------+----------------+ Common Iliac     patent              patent                              Distal                                                                   +----------------+---------+-----------+---------+-----------+----------------+  +-------------------------+---------+-----------+---------+-----------+--------+            EIV           RT-PatentRT-ThrombusLT-PatentLT-ThrombusComments  +-------------------------+---------+-----------+---------+-----------+--------+ External Iliac Vein Prox  patent              patent                      +-------------------------+---------+-----------+---------+-----------+--------+ External Iliac Vein Mid                       patent                      +-------------------------+---------+-----------+---------+-----------+--------+ External Iliac Vein       patent                         acute            Distal                                                                    +-------------------------+---------+-----------+---------+-----------+--------+   Summary: IVC/Iliac: There is evidence of acute thrombus involving the left external iliac vein.  *See table(s) above for measurements and observations.   Preliminary    DG CHEST PORT 1 VIEW  Result Date: 05/04/2021 CLINICAL DATA:  Hypoxia, shortness of breath. EXAM: PORTABLE CHEST 1 VIEW COMPARISON:  05/02/2021. FINDINGS: Feeding tube is followed into the stomach with the tip projecting beyond the inferior margin of the image. Pacemaker and ICD lead tips are in the right atrium and right ventricle, respectively. Heart is enlarged, stable. Thoracic aorta is calcified. Mixed interstitial and airspace opacification persists, right greater than left, similar to 05/02/2021. Probable  bilateral pleural effusions. IMPRESSION: Congestive heart failure and/or pneumonia. Electronically Signed   By: Lorin Picket M.D.   On: 05/04/2021 09:09   DG CHEST PORT 1 VIEW  Result Date: 05/02/2021 CLINICAL DATA:  Hypoxia EXAM: PORTABLE CHEST 1 VIEW COMPARISON:  04/26/2021 FINDINGS: Cardiac shadow is enlarged but stable. Defibrillator and postsurgical changes are seen. Feeding catheter is noted extending into the stomach. Lungs are well aerated with diffuse airspace opacity throughout the right lung as well as the left lung base increased in the interval from the prior exam. IMPRESSION:  Developing pneumonic infiltrates bilaterally right greater than left as described. Electronically Signed   By: Inez Catalina M.D.   On: 05/02/2021 23:38   VAS [place] LOWER EXTREMITY VENOUS (DVT)  Result Date: 05/04/2021  Lower Venous DVT Study Patient Name:  MANNIX KROEKER  Date of Exam:   05/04/2021 Medical Rec #: [id number]         Accession #:    [id number] Date of Birth: Nov 15, 1945         Patient Gender: M Patient Age:   9 years Exam Location:  [facility] Procedure:      VAS [place] LOWER EXTREMITY VENOUS (DVT) Referring Phys: Claiborne Billings Vickii Volland --------------------------------------------------------------------------------  Indications: Edema, and COVID.  Comparison Study: 04/22/21- negative left lower extremity venous duplex Performing Technologist: Maudry Mayhew MHA, RDMS, RVT, RDCS  Examination Guidelines: A complete evaluation includes B-mode imaging, spectral Doppler, color Doppler, and power Doppler as needed of all accessible portions of each vessel. Bilateral testing is considered an integral part of a complete examination. Limited examinations for reoccurring indications may be performed as noted. The reflux portion of the exam is performed with the patient in reverse Trendelenburg.  +---------+---------------+---------+-----------+----------+--------------+ RIGHT    CompressibilityPhasicitySpontaneityPropertiesThrombus Aging +---------+---------------+---------+-----------+----------+--------------+ CFV      Full           Yes      Yes                                 +---------+---------------+---------+-----------+----------+--------------+ SFJ      Full                                                        +---------+---------------+---------+-----------+----------+--------------+ FV Prox  Full                                                        +---------+---------------+---------+-----------+----------+--------------+ FV Mid   Full                                                         +---------+---------------+---------+-----------+----------+--------------+ FV DistalFull                                                        +---------+---------------+---------+-----------+----------+--------------+  PFV      Full                                                        +---------+---------------+---------+-----------+----------+--------------+ POP      Full           Yes      Yes                                 +---------+---------------+---------+-----------+----------+--------------+ PTV      None                    Yes                  Acute          +---------+---------------+---------+-----------+----------+--------------+ PERO     None                    No                   Acute          +---------+---------------+---------+-----------+----------+--------------+   +---------+---------------+---------+-----------+----------+--------------+ LEFT     CompressibilityPhasicitySpontaneityPropertiesThrombus Aging +---------+---------------+---------+-----------+----------+--------------+ CFV      None           Yes      Yes                  Acute          +---------+---------------+---------+-----------+----------+--------------+ SFJ      None                                         Acute          +---------+---------------+---------+-----------+----------+--------------+ FV Prox  None                                         Acute          +---------+---------------+---------+-----------+----------+--------------+ FV Mid   None                                         Acute          +---------+---------------+---------+-----------+----------+--------------+ FV DistalNone                    Yes                  Acute          +---------+---------------+---------+-----------+----------+--------------+ PFV      None                                         Acute           +---------+---------------+---------+-----------+----------+--------------+ POP      None           Yes      Yes  Acute          +---------+---------------+---------+-----------+----------+--------------+ PTV      Full                    Yes        patent                   +---------+---------------+---------+-----------+----------+--------------+ PERO     None                    No                   Acute          +---------+---------------+---------+-----------+----------+--------------+    Summary: RIGHT: - Findings consistent with acute deep vein thrombosis involving the right posterior tibial veins, and right peroneal veins.  LEFT: - Findings consistent with acute deep vein thrombosis involving the left common femoral vein, SF junction, left femoral vein, left proximal profunda vein, left popliteal vein, and left peroneal veins. - No cystic structure found in the popliteal fossa.  *See table(s) above for measurements and observations.    Preliminary    VAS [place] UPPER EXTREMITY VENOUS DUPLEX  Result Date: 05/04/2021 UPPER VENOUS STUDY  Patient Name:  GAROLD SHEELER  Date of Exam:   05/04/2021 Medical Rec #: [id number]         Accession #:    [id number] Date of Birth: 07/18/1946         Patient Gender: M Patient Age:   65 years Exam Location:  [facility] Procedure:      VAS [place] UPPER EXTREMITY VENOUS DUPLEX Referring Phys: Tresa Endo Delorese Sellin --------------------------------------------------------------------------------  Indications: Edema Other Indications: COVID. Limitations: Poor ultrasound/tissue interface. Comparison Study: No prior study Performing Technologist: Gertie Fey MHA, RDMS, RVT, RDCS  Examination Guidelines: A complete evaluation includes B-mode imaging, spectral Doppler, color Doppler, and power Doppler as needed of all accessible portions of each vessel. Bilateral testing is considered an integral part of a complete examination. Limited  examinations for reoccurring indications may be performed as noted.  Right Findings: +----------+------------+---------+-----------+----------+-------+ RIGHT     CompressiblePhasicitySpontaneousPropertiesSummary +----------+------------+---------+-----------+----------+-------+ Subclavian               Yes       Yes                      +----------+------------+---------+-----------+----------+-------+  Left Findings: +----------+------------+---------+-----------+----------+-------+ LEFT      CompressiblePhasicitySpontaneousPropertiesSummary +----------+------------+---------+-----------+----------+-------+ IJV           Full       Yes       Yes                      +----------+------------+---------+-----------+----------+-------+ Subclavian    None                 No                Acute  +----------+------------+---------+-----------+----------+-------+ Axillary      None                 No                Acute  +----------+------------+---------+-----------+----------+-------+ Brachial      None                 No                Acute  +----------+------------+---------+-----------+----------+-------+ Radial  None                 Yes               Acute  +----------+------------+---------+-----------+----------+-------+ Ulnar         None                 No                Acute  +----------+------------+---------+-----------+----------+-------+ Cephalic      Full                                          +----------+------------+---------+-----------+----------+-------+ Basilic       None                                   Acute  +----------+------------+---------+-----------+----------+-------+  Summary:  Right: No evidence of thrombosis in the subclavian.  Left: Findings consistent with acute deep vein thrombosis involving the left subclavian vein, left axillary vein, left brachial veins, left radial veins and left ulnar veins.  Findings consistent with acute superficial vein thrombosis involving the left basilic vein.  *See table(s) above for measurements and observations.    Preliminary    [place] EKG SITE RITE  Result Date: 05/04/2021 If Site Rite image not attached, placement could not be confirmed due to current cardiac rhythm.    Medications   Scheduled Meds:  amiodarone  200 mg Oral Daily   vitamin C  500 mg Oral Daily   atorvastatin  40 mg Oral q1800   dexamethasone  6 mg Oral Daily   dextromethorphan-guaiFENesin  1 tablet Oral BID   docusate sodium  100 mg Oral BID   enoxaparin (LOVENOX) injection  40 mg Subcutaneous Daily   feeding supplement  237 mL Oral BID BM   feeding supplement (PROSource TF)  45 mL Per Tube TID   insulin aspart  0-15 Units Subcutaneous TID WC   Ipratropium-Albuterol  1 puff Inhalation BID   midodrine  10 mg Oral TID WC   multivitamin with minerals  1 tablet Oral Daily   pantoprazole  40 mg Oral Daily   sodium chloride flush  3 mL Intravenous Q12H   zinc sulfate  220 mg Oral Daily   Continuous Infusions:  feeding supplement (OSMOLITE 1.5 CAL) 1,200 mL (05/02/21 1702)   piperacillin-tazobactam (ZOSYN)  IV 3.375 g (05/04/21 1238)   vancomycin 1,250 mg (05/04/21 1235)       LOS: 26 days    Time spent: 45 minutes with > 50% spent at bedside and in coordination of care      Ezekiel Slocumb, DO [facility]  05/04/2021, 3:50 PM      If 7PM-7AM, please contact night-coverage. How to contact the [facility] Attending or Consulting provider [place] or covering provider during after hours [place], for this patient?    Check the care team in [facility] and look for a) attending/consulting TR[facility] provider listed and b) the [facility] team listed Log into [email or web address] and use [place]'s universal password to access. If you do not have the password, please contact the hospital operator. Locate the [facility] provider you are looking for under [facility] and page to a number that you can  be directly reached. If you still have difficulty reaching the provider,  please page the [facility] (Director on Call) for the Hospitalists listed on amion for assistance.

## 2021-05-04 NOTE — Progress Notes (Signed)
Palliative Care Progress Note, Assessment & Plan   Patient Name: Ryan [name]       Date: 05/04/2021 DOB: [birth year]  Age: 75 y.o. MRN#: [id number] Attending Physician: Ezekiel Slocumb, DO Primary Care Physician: [name], MD Admit Date: 04/08/2021  Reason for Consultation/Follow-up: Establishing goals of care  HPI: 75 y.o. male  with past medical history of atrial fibrillation on Coumadin, mitral valve replacement, chronic systolic heart failure with an AICD, essential hypertension, peripheral vascular disease, also history of peripheral vascular bypass with an ongoing wound infection admitted on 04/08/2021 for wound debridement. 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 Dropping hgb requiring transfusion - CT 7/30 revealed retroperitoneal hemorrhage.  PMT consulted to discuss [place] with family. Patient son [name] endorsed patient wants full scope and all aggressive measures. Pt remains full code.   Pt stable for d/c to SNF on but screening d/c Covid19 test on 8/9 was positive. Pt to remain in hospital for 10 days.   Over the last few days patient has increased oxygen demands (25L [place] on 100%FiO2). US revealed acute deep vein thrombosis involving the left  common femoral vein, SF junction, left femoral vein, left proximal  profunda vein, left popliteal vein, and left peroneal veins.   Plan of Care: I received a secure chat from Dr. Arbutus Ped with concerns of the family not being aware of the patient's change in the last few days. I have reviewed medical records including EPIC notes, labs and imaging, received report from Dr. Arbutus Ped, assessed the patient and then called the patient's son [name] (no answer) and the patient's daughter in law  [name] to discuss diagnosis prognosis, [place], EOL wishes, disposition and options.  Though I have spoken with [name] in the past this was my first discussion with [place]. I introduced Palliative Medicine as specialized medical care for people living with serious illness. It focuses on providing relief from the symptoms and stress of a serious illness. The goal is to improve quality of life for both the patient and the family.  We discussed patient's current illness and what it means in the larger context of patient's on-going co-morbidities. I conveyed the patient's increased demands for oxygen and the high risk of bleeding. [name] was very knowledgeable of the patient's recent increased oxygen needs and anticoagulation concerns.  I reiterated that the patient is still a full code and in light of these recent clotting events and oxygen needs that perhaps the family would want to re-evaluate the goals of care.  I discussed with [name] the importance of continued conversation with family (son [name], other son, and daughter) and the medical providers regarding overall plan of care and treatment options, ensuring decisions are within the context of the patient's values and GOCs. She confirmed that she will ensure that the patient's three children are aware of the patient's new bleeding risks and oxygen issues. She said she will address code status with all three children tonight. She took down the number for nurse's station at [facility] and said she will call with an update on code status for the patient either tonight or first thing tomorrow morning.  Questions and concerns were addressed. The family was encouraged to call with questions or concerns.  AFter my discussion with Delrae Alfred returned my phone call. He was at work and said he appreciated the phone call and would get the update from his wife this evening.  Code Status: Full code  Prognosis:  Unable to determine  Discharge Planning: To Be  Determined  Care plan was discussed with daughter in law [name], son [name], Dr. Arbutus Ped  Length of Stay: 26  Physical Exam HENT:     Mouth/Throat:     Mouth: Mucous membranes are moist.  Cardiovascular:     Rate and Rhythm: Normal rate.  Skin:    General: Skin is warm.  Neurological:     Mental Status: He is alert. Mental status is at baseline.  Psychiatric:        Behavior: Behavior normal.            Vital Signs: BP 121/78   Pulse 72   Temp 98.2 F (36.8 C) (Oral)   Resp 17   Ht 6' (1.829 m)   Wt 88 kg   SpO2 98%   BMI 26.31 kg/m  SpO2: SpO2: 98 % O2 Device: O2 Device: High Flow Nasal Cannula O2 Flow Rate: O2 Flow Rate (L/min): 25 L/min      Palliative Assessment/Data: 15       Total Time 15 minutes Prolonged Time Billed  no   Greater than 50%  of this time was spent counseling and coordinating care related to the above assessment and plan.  Thank you for allowing the Palliative Medicine Team to assist in the care of this patient.  [place] Ilsa Iha, FNP-BC Palliative Medicine Team Team Phone # [phone]  Vinie Sill, NP Palliative Medicine Team Pager [phone] (Please see [email or web address] for schedule) Team Phone [phone]    Greater than 50%  of this time was spent counseling and coordinating care related to the above assessment and plan

## 2021-05-05 ENCOUNTER — Inpatient Hospital Stay (HOSPITAL_COMMUNITY): Payer: Medicare HMO

## 2021-05-05 DIAGNOSIS — I5022 Chronic systolic (congestive) heart failure: Secondary | ICD-10-CM | POA: Diagnosis not present

## 2021-05-05 DIAGNOSIS — R791 Abnormal coagulation profile: Secondary | ICD-10-CM | POA: Diagnosis not present

## 2021-05-05 DIAGNOSIS — I70229 Atherosclerosis of native arteries of extremities with rest pain, unspecified extremity: Secondary | ICD-10-CM | POA: Diagnosis not present

## 2021-05-05 LAB — BASIC METABOLIC PANEL
Anion gap: 8 (ref 5–15)
BUN: 61 mg/dL — ABNORMAL HIGH (ref 8–23)
CO2: 26 mmol/L (ref 22–32)
Calcium: 8.6 mg/dL — ABNORMAL LOW (ref 8.9–10.3)
Chloride: 108 mmol/L (ref 98–111)
Creatinine, Ser: 1.58 mg/dL — ABNORMAL HIGH (ref 0.61–1.24)
GFR, Estimated: 45 mL/min — ABNORMAL LOW (ref >60)
Glucose, Bld: 106 mg/dL — ABNORMAL HIGH (ref 70–99)
Potassium: 4.2 mmol/L (ref 3.5–5.1)
Sodium: 142 mmol/L (ref 135–145)

## 2021-05-05 LAB — CBC
HCT: 31.6 % — ABNORMAL LOW (ref 39.0–52.0)
Hemoglobin: 10 g/dL — ABNORMAL LOW (ref 13.0–17.0)
MCH: 25.1 pg — ABNORMAL LOW (ref 26.0–34.0)
MCHC: 31.6 g/dL (ref 30.0–36.0)
MCV: 79.2 fL — ABNORMAL LOW (ref 80.0–100.0)
Platelets: 247 10*3/uL (ref 150–400)
RBC: 3.99 MIL/uL — ABNORMAL LOW (ref 4.22–5.81)
RDW: 22.5 % — ABNORMAL HIGH (ref 11.5–15.5)
WBC: 14.9 10*3/uL — ABNORMAL HIGH (ref 4.0–10.5)
nRBC: 0.3 % — ABNORMAL HIGH (ref 0.0–0.2)

## 2021-05-05 LAB — GLUCOSE, CAPILLARY
Glucose-Capillary: 103 mg/dL — ABNORMAL HIGH (ref 70–99)
Glucose-Capillary: 105 mg/dL — ABNORMAL HIGH (ref 70–99)
Glucose-Capillary: 120 mg/dL — ABNORMAL HIGH (ref 70–99)
Glucose-Capillary: 132 mg/dL — ABNORMAL HIGH (ref 70–99)
Glucose-Capillary: 156 mg/dL — ABNORMAL HIGH (ref 70–99)
Glucose-Capillary: 172 mg/dL — ABNORMAL HIGH (ref 70–99)
Glucose-Capillary: 92 mg/dL (ref 70–99)

## 2021-05-05 LAB — HEPARIN LEVEL (UNFRACTIONATED)
Heparin Unfractionated: 0.47 IU/mL (ref 0.30–0.70)
Heparin Unfractionated: 0.34 IU/mL (ref 0.30–0.70)

## 2021-05-05 LAB — ECHOCARDIOGRAM COMPLETE
Area-P 1/2: 3.17 cm2
Calc EF: 34.3 %
Height: 72 in
MV VTI: 1.15 cm2
S' Lateral: 4.1 cm
Single Plane A2C EF: 21.5 %
Single Plane A4C EF: 48.1 %
Weight: 3104.08 oz

## 2021-05-05 LAB — C-REACTIVE PROTEIN: CRP: 16.1 mg/dL — ABNORMAL HIGH (ref <1.0)

## 2021-05-05 LAB — D-DIMER, QUANTITATIVE: D-Dimer, Quant: 9.58 ug/mL-FEU — ABNORMAL HIGH (ref 0.00–0.50)

## 2021-05-05 MED ORDER — PERFLUTREN LIPID MICROSPHERE
1.0000 mL | INTRAVENOUS | Status: AC | PRN
  Administered 2021-05-05: 2 mL via INTRAVENOUS
  Filled 2021-05-05: qty 10

## 2021-05-05 MED ORDER — TECHNETIUM TO 99M ALBUMIN AGGREGATED
4.3000 | Freq: Once | INTRAVENOUS | Status: AC | PRN
  Administered 2021-05-05: 4.3 via INTRAVENOUS

## 2021-05-05 MED ORDER — OSMOLITE 1.5 CAL PO LIQD
1000.0000 mL | ORAL | Status: DC
  Administered 2021-05-05 – 2021-05-11 (×7): 1000 mL
  Filled 2021-05-05 (×8): qty 1000

## 2021-05-05 NOTE — Progress Notes (Addendum)
Nutrition Follow Up  DOCUMENTATION CODES:   Not applicable  INTERVENTION:   Tube feeding via Cortrak:  -Osmolite 1.5 @ 65 ml/hr x 16 hours (1700-0900) -ProSource TF 45 ml TID  Provides: 1680 kcals, 98 grams protein, 792 ml free water. Meets 76% kcal needs and 93% of protein needs.   Continue regular diet with 1800 ml fluid restriction  Continue Ensure Enlive po BID, each supplement provides 350 kcal and 20 grams of protein Continue MVI with minerals daily   NUTRITION DIAGNOSIS:   Increased nutrient needs related to post-op healing as evidenced by estimated needs.  Ongoing  GOAL:   Patient will meet greater than or equal to 90% of their needs  Addressed via TF  MONITOR:   PO intake, Supplement acceptance, Diet advancement, Labs, Weight trends, Skin, I & O's  REASON FOR ASSESSMENT:   Consult Assessment of nutrition requirement/status, Wound healing  ASSESSMENT:   Ryan MATHIASON is a 75 y.o. male with medical history significant of afib; MVR; chronic systolic CHF with AICD in place; DM; HTN; and PVD presenting for foot debridement/possible BKA.    He has had difficulty with a heel ulcer, not healing up well.  He was coming in today for surgery to have it cleaned up, possible amputation.  Unable to do the surgery because his INR was too high.  He takes Coumadin for afib.  Some days he feels ok and some he doesn't.  He has not feeling like he had a fever. Pt admitted rt foot wounds with critical limb ischemia.   7/21- s/p first/second R toe amputations, R heel excisional debridement, VAC placement 7/25- s/p LLE angiography 8/01- s/p nephrostomy tube, start iHD for AKI 8/03- underwent iHD 8/10- Cortrak placed, TF started   Needs R AKA. Meeting with palliative to discuss [place].   Had episode of acute respiratory distress on 8/14, TF were held due to concern for aspiration. CXR found patient had acute PE.   Cortrak remains in the gastroduodenal area. Intake slow to  improve. Okay to restart nocturnal feedings per MD. Recommend continuing until patient demonstrates consistent meal intake >50%.   Admission weight: 73.1 kg  Current weight: 88 kg   UOP: 1725 ml x 24 hrs   Medications: 500 mg Vitamin C, decadron, colace, SS novolog, zinc sulfate 220 mg  Labs: CBG 92-130  Diet Order:   Diet Order             Diet regular Room service appropriate? Yes; Fluid consistency: Thin; Fluid restriction: 1800 mL Fluid  Diet effective now                   EDUCATION NEEDS:   Education needs have been addressed  Skin:  Skin Assessment: Skin Integrity Issues: Skin Integrity Issues:: Other (Comment) Other: rt foot wound with critical limb ischemia, L heel and L flank  Stage II: medial coccyx  Last BM:  8/11  Height:   Ht Readings from Last 1 Encounters:  04/08/21 6' (1.829 m)    Weight:   Wt Readings from Last 1 Encounters:  05/03/21 88 kg    Ideal Body Weight:  80.9 kg  BMI:  Body mass index is 26.31 kg/m.  Estimated Nutritional Needs:   Kcal:  2200-2400  Protein:  105-120 grams  Fluid:  > 2 L  Mavin Dyke MS, RD, LDN, CNSC Clinical Nutrition Pager listed in [place]

## 2021-05-05 NOTE — Progress Notes (Signed)
PROGRESS NOTE    Ryan [name]   SMO:[id number]  DOB: July 15, 1946  PCP: [name], MD    DOA: 04/08/2021 LOS: 27   Assessment & Plan   Principal Problem:   Critical lower limb ischemia ([place]) Active Problems:   Chronic systolic heart failure (HCC)   Status post mitral valve replacement with bioprosthetic valve   Diabetes mellitus ([place])   Essential hypertension   ICD (implantable cardioverter-defibrillator) in place   Stage 3 chronic kidney disease ([place])   Pressure injury of skin   Acute respiratory failure with hypoxia due to Acute Pulmonary Embolism involving RUL, RLL Pt had significantly increased requirements 8/13-14, up to 25 L/min, 100% FiO2 HFNC plus nonrebreather mask. Initially diuresed with IV Lasix and good UO, improved lower extremity edema.  Further evaluation revealed acute DVT's and PE's.  He had been off anticoagulation due to bleeding, had already been resumed prophylactic Lovenox. Discussed with vascular 8/15 and anticoagulation is only option; any intervention would be extremely high risk. --Supplemental O2 to maintain sats above 90% --Continue heparin drip  --Hold diuresis, mild bump in Cr (8/15) --V/Q scan pending.  (Avoiding CTA due to renal function and severe AKI earlier admission required dialysis) --Wean O2 as tolerated, maintain sats >90%  Acute DVT of Left Upper Extremity LUE Doppler U/S with acute DVT involving L subclavian vein, L axillary vein, L brachial veins, L radial and ulnar veins  Acute PE's of RUL and RLL  --Started heparin drip on 8/15 without bolus per vascular --Discussed with vascular surgery - very unlikely any intervention as would be extremely high risk. Anticoagulate. --Monitor Hbg and hemodynamics closely as pt has an RP hematoma and had intramuscular hemorrhage earlier this admission.  Possible aspiration -tube feeds were held.  Aspiration was suspected as cause of the acute hypoxia prior to PE diagnosis. --Resume overnight  tube feeds    --SLP consulted.   --Started on  empiric Zosyn and Vancomycin (+MRSA screen)  --Stop antibiotics and monitor clinically --Follow up sputum culture   Multifocal pneumonia due to COVID-19 - resolved - Chest x-ray showed some opacity initially thought to be fluid or atelectasis but, now Covid+ so more likely infection especially given his fever and cough --Continue supplemental oxygen, keep sats >90% - Completed remdesivir --Continue steroids to complete 10 days --Scheduled Mucinex, as needed Tessalon -- I-S, flutter, bronchodilator/inhaler - Follow inflammatory markers and CMP  Poor p.o. intake -dietitian is following.  CorTrak in place. --Tube feeds were held as above, resume overnight tube feeds --Encourage p.o. intake --RD following Body mass index is 26.31 kg/m.  Leukocytosis is on steroids for COVID infection with associated hypoxia, also with acute PE/DVT's, likely reactive. --Monitor CBC --Mgmt as above   Right critical lower limb ischemia [facility]): Vascular Surgery-recommends dressing changes. Wound care team consulted Lower extremity Dopplers negative for DVT.   Surgical debridement of the right heel with application of myriad substitute 8/8.   --Vascular surgery, Dr. [name] following --Needs Right AKA as the right foot is not healing    Retroperitoneal hematoma Acute blood loss anemia: -CT scan of the abdomen and pelvis showed new retroperitoneal hematoma he was given Vit K & FFP. Repeat CT A/P on 8/5- remains large unchanged except increase in Pleural effusion vs atelactesis.    AKI from ATN on chronic kidney disease stage IIIb: Creatinine peaked at 5.63; Improving.  -Secondary to hypotension and retroperitoneal bleed displacing left kidney - Status post hemodialysis on 8/2  --HD catheter has been removed -  Seen by nephro, urology and IR.   -- Follow-up urology outpatient as necessary.   Dr. Reesa Chew spoke with IR, Dr. Earleen Newport 8/8 - continue PCN at least for  2 months, follow-up will be arranged by their team.  Recommend routine drainage of catheter/bag and dry dressing changes.  Chronic systolic heart failure ([place]), EF 40-45% -On Midodrine $RemoveBefor'10mg'IcYnIleiOegB$  po tid.    Echo 03/04/2021-EF 40-45%, global hypokinesis.   Follow-up outpatient CHF.   Treated with IV Lasix 8/14-15 as above Hold PO Lasix 20 mg daily (home med) - resume tomorrow if Cr improved Monitor volume status    Permanent atrial fibrillation/Status post mitral valve replacement with bioprosthetic valve: Bioprosthetic valve. Anticoagulation on hold.  Amiodarone $RemoveBef'200mg'eNjLjDtGaU$  daily    Diabetes mellitus type 2: Acceptable range.  -Hemoglobin A1c 5.4.  Insulin sliding scale and Accu-Cheks  Hx of Essential HTN but currently borderline low BP -midodrine 10 mg TID  Hyperlipidemia: -Lipitor 40 mg daily   Ethics/goals of care: Seen by Palliative Care    Patient BMI: Body mass index is 26.31 kg/m.   DVT prophylaxis:    Diet:  Diet Orders (From admission, onward)     Start     Ordered   04/29/21 1436  Diet regular Room service appropriate? Yes; Fluid consistency: Thin; Fluid restriction: 1800 mL Fluid  Diet effective now       Question Answer Comment  Room service appropriate? Yes   Fluid consistency: Thin   Fluid restriction: 1800 mL Fluid      04/29/21 1437              Code Status: Full Code   Brief Narrative / Hospital Course to Date:   "75 y.o. 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  Scheduled to have debridement on 04/08/2021 but due to an INR of 3.2 surgery was postponed.  He eventually underwent partial amputation with excisional debridement and wound VAC placement on 04/09/2021, right lower  extremity angiogram in 04/13/2021.  He has completed his course of antibiotics in house last day was 04/13/2019.  He developed a retroperitoneal hematoma with left kidney displacement and hypotension developed ATN and hyperkalemia, nephrology was consulted recommended renal replacement therapy, urology was consulted due to his left hydronephrosis would like recommended a left nephrostomy tube.  Nephrostomy tube placed on 8/1 and central line placement for HD.  Patient did well after 1 HD treatment and renal function recovered.  HD catheter removed.  PT recommended SNF.  Cleared by vascular, 1-2-week follow improvement be arranged by their service.  During screening status for SNF placement patient tested positive for COVID-19"  Subjective 05/05/21    Pt seen during patient care today with nursing at bedside.  He reports breathing feels better today.  Down from 25 to 5 L/min HFNC oxygen today.  Left arm feels tight.  No other acute complaints.    Disposition Plan & Communication   Status is: Inpatient  Remains inpatient appropriate because: Severity of illness with high oxygen requirements and ongoing evaluation.  On IV heparin.  Dispo: The patient is from: Home              Anticipated d/c is to: SNF              Patient currently  is not medically stable to d/c.   Difficult to place patient No  Family communication - spoke with patient's son [name] by phone this afternoon, updated on clininical status, extensive DVT's and likely PE (VQ still pending), risk of bleeding which has occurred previously this admission.  Pt unfortynately high risk for decompensation.  Encouraged family discussions with patient and palliative care regarding code status and eliciting patient's wishes.   Consults, Procedures, Significant Events   Consultants:  Palliative care Vascular surgery Nephrology  Procedures:  As per above narrative  Antimicrobials:  Anti-infectives (From admission, onward)    Start      Dose/Rate Route Frequency Ordered Stop   05/04/21 0930  vancomycin (VANCOREADY) IVPB 1250 mg/250 mL        1,250 mg 166.7 mL/hr over 90 Minutes Intravenous Every 24 hours 05/04/21 0830     05/03/21 0200  piperacillin-tazobactam (ZOSYN) IVPB 3.375 g        3.375 g 12.5 mL/hr over 240 Minutes Intravenous Every 8 hours 05/03/21 0118     04/30/21 1000  remdesivir 100 mg in sodium chloride 0.9 % 100 mL IVPB       See Hyperspace for full Linked Orders Report.   100 mg 200 mL/hr over 30 Minutes Intravenous Daily 04/29/21 0732 05/03/21 1045   04/29/21 0900  remdesivir 200 mg in sodium chloride 0.9% 250 mL IVPB       See Hyperspace for full Linked Orders Report.   200 mg 580 mL/hr over 30 Minutes Intravenous Once 04/29/21 0732 04/29/21 1030   04/21/21 0800  cefTRIAXone (ROCEPHIN) 1 g in sodium chloride 0.9 % 100 mL IVPB  Status:  Discontinued        1 g 200 mL/hr over 30 Minutes Intravenous Every 24 hours 04/20/21 1147 04/27/21 1354   04/20/21 1200  cefTRIAXone (ROCEPHIN) 2 g in sodium chloride 0.9 % 100 mL IVPB        2 g 200 mL/hr over 30 Minutes Intravenous To Radiology 04/20/21 1107 04/20/21 1254   04/08/21 2000  cefTRIAXone (ROCEPHIN) 2 g in sodium chloride 0.9 % 100 mL IVPB  Status:  Discontinued       See Hyperspace for full Linked Orders Report.   2 g 200 mL/hr over 30 Minutes Intravenous Every 24 hours 04/08/21 1212 04/13/21 1134   04/08/21 2000  metroNIDAZOLE (FLAGYL) IVPB 500 mg  Status:  Discontinued       See Hyperspace for full Linked Orders Report.   500 mg 100 mL/hr over 60 Minutes Intravenous Every 8 hours 04/08/21 1212 04/13/21 1134   04/08/21 0823  ceFAZolin (ANCEF) IVPB 2g/100 mL premix  Status:  Discontinued        2 g 200 mL/hr over 30 Minutes Intravenous 30 min pre-op 04/08/21 0823 04/08/21 1212         Micro    Objective   Vitals:   05/05/21 0906 05/05/21 1128 05/05/21 1414 05/05/21 1540  BP:  94/65 (!) 100/59 (!) 114/38  Pulse:  70 73 73  Resp:  16  17   Temp:  98.8 F (37.1 C) 97.7 F (36.5 C) 97.9 F (36.6 C)  TempSrc:  Oral Oral Oral  SpO2: 93% 93% 94% 96%  Weight:      Height:        Intake/Output Summary (Last 24 hours) at 05/05/2021 1800 Last data filed at 05/05/2021 1505 Gross per 24 hour  Intake 490 ml  Output 1376 ml  Net -886 ml   Filed  Weights   05/01/21 0643 05/02/21 0337 05/03/21 0700  Weight: 87.1 kg 88 kg 88 kg    Physical Exam:  General exam: awake alert, no acute distress HEENT: CorTrak in place, hearing grossly normal  Respiratory system: clear with diminished bases, poor inspiratory effort, 5 L/min HFNC oxygen. Cardiovascular system: normal S1/S2, RRR Gastrointestinal system: soft, non-tender Central nervous system: A&O x3. no gross focal neurologic deficits, normal speech Extremities -severe pitting edema of the entire left upper extremity, no edema in the lower extremities, right foot dressing is clean dry and intact Psychiatric: Normal mood, congruent affect, judgment and insight appear normal  Labs   Data Reviewed: I have personally reviewed following labs and imaging studies  CBC: Recent Labs  Lab 05/01/21 0715 05/02/21 0145 05/03/21 0125 05/04/21 0500 05/05/21 0540  WBC 15.9* 19.5* 24.7* 17.5* 14.9*  HGB 9.7* 9.9* 10.1* 9.9* 10.0*  HCT 31.1* 31.0* 33.0* 32.5* 31.6*  MCV 81.0 79.7* 83.1 80.4 79.2*  PLT 261 230 261 212 443   Basic Metabolic Panel: Recent Labs  Lab 04/29/21 1739 04/30/21 0645 04/30/21 2226 05/01/21 0715 05/02/21 0145 05/03/21 0125 05/04/21 0500 05/05/21 0540  NA  --  145  --  142 142 141 142 142  K  --  3.8  --  3.8 4.4 5.0 5.0 4.2  CL  --  114*  --  114* 113* 112* 111 108  CO2  --  20*  --  20* 20* 19* 22 26  GLUCOSE  --  142*  --  199* 185* 142* 113* 106*  BUN  --  35*  --  46* 50* 55* 60* 61*  CREATININE  --  1.22  --  1.43* 1.35* 1.28* 1.45* 1.58*  CALCIUM  --  8.4*  --  8.4* 8.4* 8.5* 8.5* 8.6*  MG  --  1.9  --  1.8 1.9 1.9 2.0  --   PHOS 3.1 3.1 3.3  --    --   --   --   --    GFR: Estimated Creatinine Clearance: 43 mL/min (A) (by C-G formula based on SCr of 1.58 mg/dL (H)). Liver Function Tests: Recent Labs  Lab 05/01/21 0715 05/02/21 0145 05/03/21 0125 05/04/21 0500  AST 54* 47* 49* 41  ALT 42 37 37 29  ALKPHOS 92 94 97 89  BILITOT 1.2 0.6 1.2 1.1  PROT 6.0* 6.1* 6.5 5.8*  ALBUMIN 2.0* 2.0* 2.2* 1.9*   No results for input(s): LIPASE, AMYLASE in the last 168 hours. No results for input(s): AMMONIA in the last 168 hours. Coagulation Profile: Recent Labs  Lab 05/01/21 0715  INR 1.4*   Cardiac Enzymes: No results for input(s): CKTOTAL, CKMB, CKMBINDEX, TROPONINI in the last 168 hours. BNP (last 3 results) Recent Labs    08/25/20 1121 03/27/21 1123  PROBNP 21,395* 3,146*   HbA1C: No results for input(s): HGBA1C in the last 72 hours. CBG: Recent Labs  Lab 05/05/21 0009 05/05/21 0333 05/05/21 0736 05/05/21 1231 05/05/21 1639  GLUCAP 120* 103* 92 105* 132*   Lipid Profile: No results for input(s): CHOL, HDL, LDLCALC, TRIG, CHOLHDL, LDLDIRECT in the last 72 hours. Thyroid Function Tests: No results for input(s): TSH, T4TOTAL, FREET4, T3FREE, THYROIDAB in the last 72 hours. Anemia Panel: Recent Labs    05/03/21 0400 05/04/21 0500  FERRITIN 1,324* 1,135*   Sepsis Labs: Recent Labs  Lab 04/29/21 0826 05/02/21 2338  PROCALCITON 0.53 0.49    Recent Results (from the past 240 hour(s))  Resp Panel by  RT-PCR (Flu A&B, Covid) Nasopharyngeal Swab     Status: Abnormal   Collection Time: 04/28/21  5:44 PM   Specimen: Nasopharyngeal Swab; Nasopharyngeal(NP) swabs in vial transport medium  Result Value Ref Range Status   SARS Coronavirus 2 by RT PCR POSITIVE (A) NEGATIVE Final    Comment: RESULT CALLED TO, READ BACK BY AND VERIFIED WITHDonetta Potts RN [id number] 04/28/21 A BROWNING (NOTE) SARS-CoV-2 target nucleic acids are DETECTED.  The SARS-CoV-2 RNA is generally detectable in upper respiratory specimens during the  acute phase of infection. Positive results are indicative of the presence of the identified virus, but do not rule out bacterial infection or co-infection with other pathogens not detected by the test. Clinical correlation with patient history and other diagnostic information is necessary to determine patient infection status. The expected result is Negative.  Fact Sheet for Patients: EntrepreneurPulse.com.au  Fact Sheet for Healthcare Providers: IncredibleEmployment.be  This test is not yet approved or cleared by the [place] FDA and  has been authorized for detection and/or diagnosis of SARS-CoV-2 by FDA under an Emergency Use Authorization (EUA).  This EUA will remain in effect (meaning this test can  be used) for the duration of  the COVID-19 declaration under Section 564(b)(1) of the Act, 21 U.S.C. section 360bbb-3(b)(1), unless the authorization is terminated or revoked sooner.     Influenza A by PCR NEGATIVE NEGATIVE Final   Influenza B by PCR NEGATIVE NEGATIVE Final    Comment: (NOTE) The Xpert Xpress SARS-CoV-2/FLU/RSV plus assay is intended as an aid in the diagnosis of influenza from Nasopharyngeal swab specimens and should not be used as a sole basis for treatment. Nasal washings and aspirates are unacceptable for Xpert Xpress SARS-CoV-2/FLU/RSV testing.  Fact Sheet for Patients: EntrepreneurPulse.com.au  Fact Sheet for Healthcare Providers: IncredibleEmployment.be  This test is not yet approved or cleared by the [place] FDA and has been authorized for detection and/or diagnosis of SARS-CoV-2 by FDA under an Emergency Use Authorization (EUA). This EUA will remain in effect (meaning this test can be used) for the duration of the COVID-19 declaration under Section 564(b)(1) of the Act, 21 U.S.C. section 360bbb-3(b)(1), unless the authorization is terminated or revoked.  Performed at  [facility], [place] [address] [place], [place] [address]   MRSA Next Gen by PCR, Nasal     Status: Abnormal   Collection Time: 05/03/21 12:18 AM   Specimen: Nasal Mucosa; Nasal Swab  Result Value Ref Range Status   MRSA by PCR Next Gen POSITIVE (A) NOT DETECTED Final    Comment: CRITICAL RESULT CALLED TO, READ BACK BY AND VERIFIED WITH: RN Corie Chiquito [phone] $RemoveBeforeDEI'[id number]$  THANEY Performed at [facility], [place] [address] [place], [place] [address]       Imaging Studies   NM Pulmonary Perfusion  Result Date: 05/05/2021 CLINICAL DATA:  PE suspected, high prob EXAM: NUCLEAR MEDICINE PERFUSION LUNG SCAN TECHNIQUE: Perfusion images were obtained in multiple projections after intravenous injection of radiopharmaceutical. Ventilation scans intentionally deferred if perfusion scan and chest x-ray adequate for interpretation during COVID 19 epidemic. RADIOPHARMACEUTICALS:  4.3 mCi Tc-83m MAA IV COMPARISON:  Chest radiograph earlier today. FINDINGS: There is a wedge-shaped perfusion defect in the right upper lobe, with additional wedge-shaped perfusion defect in the right lower lobe. Left lung perfusion is minimally heterogeneous but no wedge-shaped defects are seen. Photopenia in the left upper lung zone is related to pacemaker. Cardiomegaly is seen. IMPRESSION: Positive for wedge-shaped perfusion defects in the  right upper and right lower lobe consistent with pulmonary emboli. Examination is positive for pulmonary embolus. Electronically Signed   By: Keith Rake M.D.   On: 05/05/2021 15:39   VAS [place] IVC/ILIAC (VENOUS ONLY)  Result Date: 05/04/2021 IVC/ILIAC STUDY Patient Name:  JAEGER TRUEHEART  Date of Exam:   05/04/2021 Medical Rec #: [id number]         Accession #:    [id number] Date of Birth: 1946/04/04         Patient Gender: M Patient Age:   35 years Exam Location:  [facility] Procedure:      VAS [place] IVC/ILIAC (VENOUS ONLY) Referring Phys:  --------------------------------------------------------------------------------  Indications: DVT seen on lower extremity venous duplex Other Factors: COVID. Limitations: Air/bowel gas, obesity and hematoma at midline/along left iliacus muscle, seen on CT.  Comparison Study: No prior study Performing Technologist: Maudry Mayhew MHA, RDMS, RVT, RDCS  Examination Guidelines: A complete evaluation includes B-mode imaging, spectral Doppler, color Doppler, and power Doppler as needed of all accessible portions of each vessel. Bilateral testing is considered an integral part of a complete examination. Limited examinations for reoccurring indications may be performed as noted.  +----------------+---------+-----------+---------+-----------+----------------+       CIV       RT-PatentRT-ThrombusLT-PatentLT-Thrombus    Comments     +----------------+---------+-----------+---------+-----------+----------------+ Common Iliac                         patent                Unable to     Prox                                                    visualize right                                                            proximal CIV   +----------------+---------+-----------+---------+-----------+----------------+ Common Iliac     patent              patent                              Distal                                                                   +----------------+---------+-----------+---------+-----------+----------------+  +-------------------------+---------+-----------+---------+-----------+--------+            EIV           RT-PatentRT-ThrombusLT-PatentLT-ThrombusComments +-------------------------+---------+-----------+---------+-----------+--------+ External Iliac Vein Prox  patent              patent                      +-------------------------+---------+-----------+---------+-----------+--------+ External Iliac Vein Mid  patent                       +-------------------------+---------+-----------+---------+-----------+--------+ External Iliac Vein       patent                         acute            Distal                                                                    +-------------------------+---------+-----------+---------+-----------+--------+   Summary: IVC/Iliac: There is evidence of acute thrombus involving the left external iliac vein.  *See table(s) above for measurements and observations.  Electronically signed by Monica Martinez MD on 05/04/2021 at 4:13:03 PM.    Final    DG Chest Port 1 View  Result Date: 05/04/2021 CLINICAL DATA:  Right PICC placement EXAM: PORTABLE CHEST 1 VIEW COMPARISON:  Chest radiograph from earlier today. FINDINGS: Right PICC terminates at the cavoatrial junction. Enteric tube enters stomach with the tip not seen on this image. Stable configuration of intact sternotomy wires, cardiac valvular prosthesis and 2 lead left subclavian ICD. Stable cardiomediastinal silhouette with mild cardiomegaly. No pneumothorax. Small bilateral pleural effusions are stable. Patchy hazy parahilar interstitial opacities, improved. Stable left basilar atelectasis. IMPRESSION: 1. Right PICC terminates at the cavoatrial junction. 2. Improved patchy hazy parahilar interstitial opacities, compatible with improving cardiogenic pulmonary edema. 3. Stable small bilateral pleural effusions and left basilar atelectasis. Electronically Signed   By: Ilona Sorrel M.D.   On: 05/04/2021 19:10   DG CHEST PORT 1 VIEW  Result Date: 05/04/2021 CLINICAL DATA:  Hypoxia, shortness of breath. EXAM: PORTABLE CHEST 1 VIEW COMPARISON:  05/02/2021. FINDINGS: Feeding tube is followed into the stomach with the tip projecting beyond the inferior margin of the image. Pacemaker and ICD lead tips are in the right atrium and right ventricle, respectively. Heart is enlarged, stable. Thoracic aorta is calcified. Mixed interstitial  and airspace opacification persists, right greater than left, similar to 05/02/2021. Probable bilateral pleural effusions. IMPRESSION: Congestive heart failure and/or pneumonia. Electronically Signed   By: Lorin Picket M.D.   On: 05/04/2021 09:09   ECHOCARDIOGRAM COMPLETE  Result Date: 05/05/2021    ECHOCARDIOGRAM REPORT   Patient Name:   Ryan [name] Date of Exam: 05/05/2021 Medical Rec #:  [id number]        Height:       72.0 in Accession #:    [id number]       Weight:       194.0 lb Date of Birth:  06-28-1946        BSA:          2.103 m Patient Age:    75 years         BP:           94/65 mmHg Patient Gender: M                HR:           70 bpm. Exam Location:  Inpatient Procedure: 2D Echo, Color Doppler, Cardiac Doppler and Intracardiac            Opacification Agent Indications:  Positive D dimer [[id number]]  History:        Patient has prior history of Echocardiogram examinations, most                 recent 03/04/2021. Defibrillator, PAD, Arrythmias:PVC,                 Supraventricular tachycardia and Atrial Fibrillation; Risk                 Factors:Hypertension and Diabetes. COVID. Dilated                 cardiomyopathy.                  Mitral Valve: 27 mm [facility] bioprosthetic valve valve is                 present in the mitral position. Procedure Date: 09/26/2012.  Sonographer:    Darlina Sicilian RDCS Referring Phys: [id number] [place]  1. Left ventricular ejection fraction, by estimation, is 25 to 30%. The left ventricle has severely decreased function. The left ventricle demonstrates global hypokinesis. The left ventricular internal cavity size was mildly dilated. Left ventricular diastolic parameters are indeterminate.  2. Device leads in RV/RA. Right ventricular systolic function is moderately reduced. The right ventricular size is moderately enlarged. There is moderately elevated pulmonary artery systolic pressure.  3. Left atrial size was moderately dilated.  4.  Right atrial size was mildly dilated.  5. 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 The mitral valve has been repaired/replaced. No evidence of mitral valve regurgitation. No evidence of mitral stenosis. There is a 27 mm [facility] bioprosthetic valve present in the mitral position. Procedure Date: 09/26/2012.  6. Tricuspid valve regurgitation is moderate.  7. The aortic valve is calcified. There is moderate calcification of the aortic valve. Aortic valve regurgitation is not visualized. Mild to moderate aortic valve sclerosis/calcification is present, without any evidence of aortic stenosis.  8. Aortic dilatation noted. There is mild dilatation of the aortic root, measuring 41 mm.  9. The inferior vena cava is normal in size with greater than 50% respiratory variability, suggesting right atrial pressure of 3 mmHg. FINDINGS  Left Ventricle: Left ventricular ejection fraction, by estimation, is 25 to 30%. The left ventricle has severely decreased function. The left ventricle demonstrates global hypokinesis. Definity contrast agent was given IV to delineate the left ventricular endocardial borders. The left ventricular internal cavity size was mildly dilated. There is no left ventricular hypertrophy. Left ventricular diastolic parameters are indeterminate. Right Ventricle: Device leads in RV/RA. The right ventricular size is moderately enlarged. No increase in right ventricular wall thickness. Right ventricular systolic function is moderately reduced. There is moderately elevated pulmonary artery systolic pressure. The tricuspid regurgitant velocity is 3.28 m/s, and with an assumed right atrial pressure of 15 mmHg, the estimated right ventricular systolic pressure is 35.3 mmHg. Left Atrium: Left atrial size was moderately dilated. Right Atrium: Right atrial size was mildly dilated. Pericardium: There is no evidence of pericardial effusion. Mitral Valve: Normal functioning bioprosthetic MVR no PVL. The mitral  valve has been repaired/replaced. No evidence of mitral valve regurgitation. There is a 27 mm [facility] bioprosthetic valve present in the mitral position. Procedure Date: 09/26/2012. No evidence of mitral valve stenosis. MV peak gradient, 11.1 mmHg. The mean mitral valve gradient is 6.5 mmHg. Tricuspid Valve: The tricuspid valve is normal in structure. Tricuspid valve regurgitation is moderate . No evidence of tricuspid stenosis. Aortic  Valve: The aortic valve is calcified. There is moderate calcification of the aortic valve. Aortic valve regurgitation is not visualized. Mild to moderate aortic valve sclerosis/calcification is present, without any evidence of aortic stenosis. Pulmonic Valve: The pulmonic valve was normal in structure. Pulmonic valve regurgitation is mild. No evidence of pulmonic stenosis. Aorta: The aortic root is normal in size and structure and aortic dilatation noted. There is mild dilatation of the aortic root, measuring 41 mm. Venous: The inferior vena cava is normal in size with greater than 50% respiratory variability, suggesting right atrial pressure of 3 mmHg. IAS/Shunts: No atrial level shunt detected by color flow Doppler. Additional Comments: A device lead is visualized.  LEFT VENTRICLE PLAX 2D LVIDd:         5.00 cm      Diastology LVIDs:         4.10 cm      LV e' medial:    5.30 cm/s LV PW:         0.80 cm      LV E/e' medial:  24.9 LV IVS:        1.00 cm      LV e' lateral:   7.23 cm/s LVOT diam:     2.20 cm      LV E/e' lateral: 18.3 LV SV:         57 LV SV Index:   27 LVOT Area:     3.80 cm  LV Volumes (MOD) LV vol d, MOD A2C: 111.5 ml LV vol d, MOD A4C: 92.9 ml LV vol s, MOD A2C: 87.5 ml LV vol s, MOD A4C: 48.2 ml LV SV MOD A2C:     24.0 ml LV SV MOD A4C:     92.9 ml LV SV MOD BP:      35.8 ml RIGHT VENTRICLE RV S prime:     9.66 cm/s TAPSE (M-mode): 1.8 cm LEFT ATRIUM             Index       RIGHT ATRIUM           Index LA diam:        4.50 cm 2.14 cm/m  RA Area:     24.70  cm LA Vol (A2C):   41.1 ml 19.54 ml/m RA Volume:   73.50 ml  34.95 ml/m LA Vol (A4C):   36.0 ml 17.12 ml/m LA Biplane Vol: 40.3 ml 19.16 ml/m  AORTIC VALVE             PULMONIC VALVE LVOT Vmax:   76.50 cm/s  PR End Diast Vel: 12.39 msec LVOT Vmean:  45.500 cm/s LVOT VTI:    0.150 m  AORTA Ao Root diam: 4.10 cm Ao Asc diam:  3.85 cm MITRAL VALVE                TRICUSPID VALVE MV Area (PHT): 3.17 cm     TR Peak grad:   43.0 mmHg MV Area VTI:   1.15 cm     TR Vmax:        328.00 cm/s MV Peak grad:  11.1 mmHg MV Mean grad:  6.5 mmHg     SHUNTS MV Vmax:       1.66 m/s     Systemic VTI:  0.15 m MV Vmean:      121.5 cm/s   Systemic Diam: 2.20 cm MV Decel Time: 239 msec MV E velocity: 132.00 cm/s MV A velocity: 161.00 cm/s MV E/A ratio:  0.82 Jenkins Rouge MD Electronically signed by Jenkins Rouge MD Signature Date/Time: 05/05/2021/11:57:31 AM    Final    VAS [place] LOWER EXTREMITY VENOUS (DVT)  Result Date: 05/04/2021  Lower Venous DVT Study Patient Name:  Ryan [name]  Date of Exam:   05/04/2021 Medical Rec #: [id number]         Accession #:    [id number] Date of Birth: 1946-09-05         Patient Gender: M Patient Age:   63 years Exam Location:  [facility] Procedure:      VAS [place] LOWER EXTREMITY VENOUS (DVT) Referring Phys: Claiborne Billings Obediah Welles --------------------------------------------------------------------------------  Indications: Edema, and COVID.  Comparison Study: 04/22/21- negative left lower extremity venous duplex Performing Technologist: Maudry Mayhew MHA, RDMS, RVT, RDCS  Examination Guidelines: A complete evaluation includes B-mode imaging, spectral Doppler, color Doppler, and power Doppler as needed of all accessible portions of each vessel. Bilateral testing is considered an integral part of a complete examination. Limited examinations for reoccurring indications may be performed as noted. The reflux portion of the exam is performed with the patient in reverse Trendelenburg.   +---------+---------------+---------+-----------+----------+--------------+ RIGHT    CompressibilityPhasicitySpontaneityPropertiesThrombus Aging +---------+---------------+---------+-----------+----------+--------------+ CFV      Full           Yes      Yes                                 +---------+---------------+---------+-----------+----------+--------------+ SFJ      Full                                                        +---------+---------------+---------+-----------+----------+--------------+ FV Prox  Full                                                        +---------+---------------+---------+-----------+----------+--------------+ FV Mid   Full                                                        +---------+---------------+---------+-----------+----------+--------------+ FV DistalFull                                                        +---------+---------------+---------+-----------+----------+--------------+ PFV      Full                                                        +---------+---------------+---------+-----------+----------+--------------+ POP      Full           Yes      Yes                                 +---------+---------------+---------+-----------+----------+--------------+  PTV      None                    Yes                  Acute          +---------+---------------+---------+-----------+----------+--------------+ PERO     None                    No                   Acute          +---------+---------------+---------+-----------+----------+--------------+   +---------+---------------+---------+-----------+----------+--------------+ LEFT     CompressibilityPhasicitySpontaneityPropertiesThrombus Aging +---------+---------------+---------+-----------+----------+--------------+ CFV      None           Yes      Yes                  Acute           +---------+---------------+---------+-----------+----------+--------------+ SFJ      None                                         Acute          +---------+---------------+---------+-----------+----------+--------------+ FV Prox  None                                         Acute          +---------+---------------+---------+-----------+----------+--------------+ FV Mid   None                                         Acute          +---------+---------------+---------+-----------+----------+--------------+ FV DistalNone                    Yes                  Acute          +---------+---------------+---------+-----------+----------+--------------+ PFV      None                                         Acute          +---------+---------------+---------+-----------+----------+--------------+ POP      None           Yes      Yes                  Acute          +---------+---------------+---------+-----------+----------+--------------+ PTV      Full                    Yes        patent                   +---------+---------------+---------+-----------+----------+--------------+ PERO     None                    No  Acute          +---------+---------------+---------+-----------+----------+--------------+     Summary: RIGHT: - Findings consistent with acute deep vein thrombosis involving the right posterior tibial veins, and right peroneal veins.  LEFT: - Findings consistent with acute deep vein thrombosis involving the left common femoral vein, SF junction, left femoral vein, left proximal profunda vein, left popliteal vein, and left peroneal veins. - No cystic structure found in the popliteal fossa.  *See table(s) above for measurements and observations. Electronically signed by Monica Martinez MD on 05/04/2021 at 4:15:55 PM.    Final    VAS [place] UPPER EXTREMITY VENOUS DUPLEX  Result Date: 05/04/2021 UPPER VENOUS STUDY  Patient Name:  OLIVER HEITZENRATER  Date of Exam:   05/04/2021 Medical Rec #: [id number]         Accession #:    [id number] Date of Birth: 06-12-1946         Patient Gender: M Patient Age:   75 years Exam Location:  [facility] Procedure:      VAS [place] UPPER EXTREMITY VENOUS DUPLEX Referring Phys: Claiborne Billings Tibor Lemmons --------------------------------------------------------------------------------  Indications: Edema Other Indications: COVID. Limitations: Poor ultrasound/tissue interface. Comparison Study: No prior study Performing Technologist: Maudry Mayhew MHA, RDMS, RVT, RDCS  Examination Guidelines: A complete evaluation includes B-mode imaging, spectral Doppler, color Doppler, and power Doppler as needed of all accessible portions of each vessel. Bilateral testing is considered an integral part of a complete examination. Limited examinations for reoccurring indications may be performed as noted.  Right Findings: +----------+------------+---------+-----------+----------+-------+ RIGHT     CompressiblePhasicitySpontaneousPropertiesSummary +----------+------------+---------+-----------+----------+-------+ Subclavian               Yes       Yes                      +----------+------------+---------+-----------+----------+-------+  Left Findings: +----------+------------+---------+-----------+----------+-------+ LEFT      CompressiblePhasicitySpontaneousPropertiesSummary +----------+------------+---------+-----------+----------+-------+ IJV           Full       Yes       Yes                      +----------+------------+---------+-----------+----------+-------+ Subclavian    None                 No                Acute  +----------+------------+---------+-----------+----------+-------+ Axillary      None                 No                Acute  +----------+------------+---------+-----------+----------+-------+ Brachial      None                 No                Acute   +----------+------------+---------+-----------+----------+-------+ Radial        None                 Yes               Acute  +----------+------------+---------+-----------+----------+-------+ Ulnar         None                 No                Acute  +----------+------------+---------+-----------+----------+-------+ Cephalic      Full                                          +----------+------------+---------+-----------+----------+-------+  Basilic       None                                   Acute  +----------+------------+---------+-----------+----------+-------+  Summary:  Right: No evidence of thrombosis in the subclavian.  Left: Findings consistent with acute deep vein thrombosis involving the left subclavian vein, left axillary vein, left brachial veins, left radial veins and left ulnar veins. Findings consistent with acute superficial vein thrombosis involving the left basilic vein.  *See table(s) above for measurements and observations.  Diagnosing physician: Monica Martinez MD Electronically signed by Monica Martinez MD on 05/04/2021 at 4:12:40 PM.    Final    [place] EKG SITE RITE  Result Date: 05/04/2021 If Site Rite image not attached, placement could not be confirmed due to current cardiac rhythm.    Medications   Scheduled Meds:  amiodarone  200 mg Oral Daily   vitamin C  500 mg Oral Daily   atorvastatin  40 mg Oral q1800   Chlorhexidine Gluconate Cloth  6 each Topical Daily   dexamethasone  6 mg Oral Daily   dextromethorphan-guaiFENesin  1 tablet Oral BID   docusate sodium  100 mg Oral BID   feeding supplement  237 mL Oral BID BM   feeding supplement (OSMOLITE 1.5 CAL)  1,000 mL Per Tube Q24H   feeding supplement (PROSource TF)  45 mL Per Tube TID   insulin aspart  0-15 Units Subcutaneous TID WC   Ipratropium-Albuterol  1 puff Inhalation BID   midodrine  10 mg Oral TID WC   multivitamin with minerals  1 tablet Oral Daily   pantoprazole  40 mg Oral  Daily   sodium chloride flush  10-40 mL Intracatheter Q12H   sodium chloride flush  3 mL Intravenous Q12H   zinc sulfate  220 mg Oral Daily   Continuous Infusions:  heparin 1,400 Units/hr (05/04/21 1659)   piperacillin-tazobactam (ZOSYN)  IV 3.375 g (05/05/21 1540)   vancomycin 1,250 mg (05/05/21 1103)       LOS: 27 days    Time spent: 30 minutes       Ezekiel Slocumb, DO [facility]  05/05/2021, 6:00 PM      If 7PM-7AM, please contact night-coverage. How to contact the [facility] Attending or Consulting provider [place] or covering provider during after hours [place], for this patient?    Check the care team in [facility] and look for a) attending/consulting TRH provider listed and b) the [facility] team listed Log into [email or web address] and use [place]'s universal password to access. If you do not have the password, please contact the hospital operator. Locate the [facility] provider you are looking for under [facility] and page to a number that you can be directly reached. If you still have difficulty reaching the provider, please page the [facility] (Director on Call) for the Hospitalists listed on amion for assistance.

## 2021-05-05 NOTE — Progress Notes (Signed)
  Echocardiogram 2D Echocardiogram with definity has been performed.  Darlina Sicilian M 05/05/2021, 11:43 AM

## 2021-05-05 NOTE — Progress Notes (Signed)
[place] for IV Heparin Indication:  VTE Treatment  Allergies  Allergen Reactions   Tuna [Fish Allergy] Nausea And Vomiting    Patient Measurements: Height: 6' (182.9 cm) Weight: 88 kg (194 lb 0.1 oz) IBW/kg (Calculated) : 77.6 Heparin Dosing Weight:  88 kg  Vital Signs: Temp: 98 F (36.7 C) (08/16 0738) Temp Source: Oral (08/16 0738) BP: 100/43 (08/16 0738) Pulse Rate: 63 (08/16 0738)  Labs: Recent Labs    05/03/21 0125 05/04/21 0500 05/05/21 0050 05/05/21 0540  HGB 10.1* 9.9*  --  10.0*  HCT 33.0* 32.5*  --  31.6*  PLT 261 212  --  247  HEPARINUNFRC  --   --  0.47 0.34  CREATININE 1.28* 1.45*  --  1.58*     Estimated Creatinine Clearance: 43 mL/min (A) (by C-G formula based on SCr of 1.58 mg/dL (H)).   Medical History: Past Medical History:  Diagnosis Date   AICD (automatic cardioverter/defibrillator) present    Arthritis    Atrial fibrillation (HCC)    Cardiomyopathy, dilated (HCC)    CHF (congestive heart failure) (HCC)    Diabetes mellitus without complication ([place])    Endocarditis    Hypertension    Peripheral vascular disease (HCC)    PVC's (premature ventricular contractions)    SVT (supraventricular tachycardia)  long RP     Assessment: 75 yr old man admitted on 04/08/21 with critical limb ischemic of RLE. Pt with acute respiratory failure with hypoxia with increased oxygen requirements 8/13-8/14. DVT prophylaxis had been on hold due to recent retroperitoneal hematoma, for which he was given vit K and FFP (repeat CT on 8/5: remains unchanged). Pt started on Lovenox for VTE prophylaxis on 8/14 (last dose ~0800 this AM). Pt also with permanent atrial fibrillation, also with mitral valve replacement with bioprosthetic valve (on warfarin PTA for afib, on hold). Also recent dx of COVID infection.  Dopplers today (8/15): findings consistent with acute DVTs bilaterally and L subclavian vein, L axillary vein, L brachial  veins, L radial veins, L ulnar veins, L basilic vein. VQ scan has been ordered. Pharmacy is consulted to dose IV heparin for VTE treatment; no boluses, per Vascular Surgery.  Heparin level this AM remains in low end of therapeutic range.  No overt bleeding or complications noted.  Goal of Therapy:  Heparin 0.3-0.5 units/ml (will aim for lower end of goal range, due to recent hx retroperitoneal hematoma) Monitor platelets by anticoagulation protocol: Yes   Plan:  Cont heparin at 1400 units/hr Confirmatory heparin level with AM labs F/U VQ scan Marguerite Olea, [facility] Clinical Pharmacist  05/05/2021 8:34 AM   [facility] pharmacy phone numbers are listed on [email or web address]

## 2021-05-05 NOTE — Progress Notes (Signed)
SLP Cancellation Note  Patient Details Name: Ryan [name] MRN: [id number] DOB: 1946/09/14   Cancelled treatment:       Reason Eval/Treat Not Completed: Unable to assess diet tolerance at this time - patient at procedure or test/unavailable. Will continue efforts.   Deklen Popelka B. Quentin Ore, [facility] [place] Speech Language Pathologist Office: [phone]  Shonna Chock 05/05/2021, 1:44 PM

## 2021-05-05 NOTE — Progress Notes (Signed)
[place] for IV Heparin Indication:  VTE Treatment  Allergies  Allergen Reactions   Tuna [Fish Allergy] Nausea And Vomiting    Patient Measurements: Height: 6' (182.9 cm) Weight: 88 kg (194 lb 0.1 oz) IBW/kg (Calculated) : 77.6 Heparin Dosing Weight:  88 kg  Vital Signs: Temp: 98.4 F (36.9 C) (08/16 0008) Temp Source: Oral (08/16 0008) BP: 90/37 (08/16 0008) Pulse Rate: 68 (08/16 0008)  Labs: Recent Labs    05/03/21 0125 05/04/21 0500 05/05/21 0050  HGB 10.1* 9.9*  --   HCT 33.0* 32.5*  --   PLT 261 212  --   HEPARINUNFRC  --   --  0.47  CREATININE 1.28* 1.45*  --      Estimated Creatinine Clearance: 46.8 mL/min (A) (by C-G formula based on SCr of 1.45 mg/dL (H)).   Medical History: Past Medical History:  Diagnosis Date   AICD (automatic cardioverter/defibrillator) present    Arthritis    Atrial fibrillation (HCC)    Cardiomyopathy, dilated (HCC)    CHF (congestive heart failure) (HCC)    Diabetes mellitus without complication ([place])    Endocarditis    Hypertension    Peripheral vascular disease (HCC)    PVC's (premature ventricular contractions)    SVT (supraventricular tachycardia)  long RP     Assessment: 75 yr old man admitted on 04/08/21 with critical limb ischemic of RLE. Pt with acute respiratory failure with hypoxia with increased oxygen requirements 8/13-8/14. DVT prophylaxis had been on hold due to recent retroperitoneal hematoma, for which he was given vit K and FFP (repeat CT on 8/5: remains unchanged). Pt started on Lovenox for VTE prophylaxis on 8/14 (last dose ~0800 this AM). Pt also with permanent atrial fibrillation, also with mitral valve replacement with bioprosthetic valve (on warfarin PTA for afib, on hold). Also recent dx of COVID infection.  Dopplers today (8/15): findings consistent with acute DVTs bilaterally and L subclavian vein, L axillary vein, L brachial veins, L radial veins, L ulnar veins,  L basilic vein. VQ scan has been ordered. Pharmacy is consulted to dose IV heparin for VTE treatment; no boluses, per Vascular Surgery.  H/H 9.9/32.5, plt 212 (CBC ~stable). D-dimers elevated since 8/10; 12.08 today. 8/12 INR 1.4. CrCl ~47 ml/min. Per RN, no bleeding issues observed; IV team is at bedside now to start IV (previous IV infiltrated).  8/16 AM update:  Heparin level therapeutic  Goal of Therapy:  Heparin 0.3-0.5 units/ml (will aim for lower end of goal range, due to recent hx retroperitoneal hematoma) Monitor platelets by anticoagulation protocol: Yes   Plan:  Cont heparin at 1400 units/hr Confirmatory heparin level with AM labs F/U VQ scan  Narda Bonds, PharmD, BCPS Clinical Pharmacist Phone: [phone]

## 2021-05-05 NOTE — Progress Notes (Signed)
MD made aware of nuclear medicine scan results.  Clyde Canterbury, RN

## 2021-05-05 NOTE — Progress Notes (Signed)
RT note. Patient placed on 7L salter at this time sat 97% w/ stable VS. Patient resting comfortable, RN made aware of changes. [facility] is in patients room if he ends up needing it again. RT will continue to monitor.

## 2021-05-06 DIAGNOSIS — I70229 Atherosclerosis of native arteries of extremities with rest pain, unspecified extremity: Secondary | ICD-10-CM | POA: Diagnosis not present

## 2021-05-06 DIAGNOSIS — I5022 Chronic systolic (congestive) heart failure: Secondary | ICD-10-CM | POA: Diagnosis not present

## 2021-05-06 DIAGNOSIS — I10 Essential (primary) hypertension: Secondary | ICD-10-CM | POA: Diagnosis not present

## 2021-05-06 DIAGNOSIS — Z95810 Presence of automatic (implantable) cardiac defibrillator: Secondary | ICD-10-CM

## 2021-05-06 DIAGNOSIS — N179 Acute kidney failure, unspecified: Secondary | ICD-10-CM

## 2021-05-06 DIAGNOSIS — Z953 Presence of xenogenic heart valve: Secondary | ICD-10-CM

## 2021-05-06 LAB — D-DIMER, QUANTITATIVE: D-Dimer, Quant: 10.16 ug/mL-FEU — ABNORMAL HIGH (ref 0.00–0.50)

## 2021-05-06 LAB — BASIC METABOLIC PANEL
Anion gap: 9 (ref 5–15)
BUN: 59 mg/dL — ABNORMAL HIGH (ref 8–23)
CO2: 23 mmol/L (ref 22–32)
Calcium: 8.4 mg/dL — ABNORMAL LOW (ref 8.9–10.3)
Chloride: 111 mmol/L (ref 98–111)
Creatinine, Ser: 1.57 mg/dL — ABNORMAL HIGH (ref 0.61–1.24)
GFR, Estimated: 45 mL/min — ABNORMAL LOW (ref >60)
Glucose, Bld: 137 mg/dL — ABNORMAL HIGH (ref 70–99)
Potassium: 4.1 mmol/L (ref 3.5–5.1)
Sodium: 143 mmol/L (ref 135–145)

## 2021-05-06 LAB — CBC
HCT: 32.2 % — ABNORMAL LOW (ref 39.0–52.0)
Hemoglobin: 10 g/dL — ABNORMAL LOW (ref 13.0–17.0)
MCH: 24.9 pg — ABNORMAL LOW (ref 26.0–34.0)
MCHC: 31.1 g/dL (ref 30.0–36.0)
MCV: 80.3 fL (ref 80.0–100.0)
Platelets: 240 10*3/uL (ref 150–400)
RBC: 4.01 MIL/uL — ABNORMAL LOW (ref 4.22–5.81)
RDW: 22.6 % — ABNORMAL HIGH (ref 11.5–15.5)
WBC: 12 10*3/uL — ABNORMAL HIGH (ref 4.0–10.5)
nRBC: 0.3 % — ABNORMAL HIGH (ref 0.0–0.2)

## 2021-05-06 LAB — GLUCOSE, CAPILLARY
Glucose-Capillary: 141 mg/dL — ABNORMAL HIGH (ref 70–99)
Glucose-Capillary: 148 mg/dL — ABNORMAL HIGH (ref 70–99)
Glucose-Capillary: 182 mg/dL — ABNORMAL HIGH (ref 70–99)
Glucose-Capillary: 180 mg/dL — ABNORMAL HIGH (ref 70–99)
Glucose-Capillary: 168 mg/dL — ABNORMAL HIGH (ref 70–99)

## 2021-05-06 LAB — HEPARIN LEVEL (UNFRACTIONATED)
Heparin Unfractionated: 0.28 IU/mL — ABNORMAL LOW (ref 0.30–0.70)
Heparin Unfractionated: 0.15 IU/mL — ABNORMAL LOW (ref 0.30–0.70)

## 2021-05-06 LAB — C-REACTIVE PROTEIN: CRP: 11 mg/dL — ABNORMAL HIGH (ref <1.0)

## 2021-05-06 LAB — MAGNESIUM: Magnesium: 2.2 mg/dL (ref 1.7–2.4)

## 2021-05-06 MED ORDER — NYSTATIN 100000 UNIT/ML MT SUSP
5.0000 mL | Freq: Four times a day (QID) | OROMUCOSAL | Status: DC
  Administered 2021-05-06 – 2021-05-14 (×31): 500000 [IU] via ORAL
  Filled 2021-05-06 (×25): qty 5

## 2021-05-06 NOTE — Progress Notes (Signed)
Re-evaluated patient this morning. No change in R foot. 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 Will look for OR time. Likely Friday.   [name]. [name], MD [facility] Phone Number: [phone] 05/06/2021 8:14 AM

## 2021-05-06 NOTE — Progress Notes (Signed)
[place] for IV Heparin Indication:  VTE Treatment  Allergies  Allergen Reactions   Tuna [Fish Allergy] Nausea And Vomiting    Patient Measurements: Height: 6' (182.9 cm) Weight: 84.4 kg (186 lb) IBW/kg (Calculated) : 77.6 Heparin Dosing Weight:  88 kg  Vital Signs: Temp: 98 F (36.7 C) (08/17 0300) Temp Source: Oral (08/17 0300) BP: 100/40 (08/17 0300) Pulse Rate: 66 (08/17 0300)  Labs: Recent Labs    05/04/21 0500 05/05/21 0050 05/05/21 0540 05/06/21 0300  HGB 9.9*  --  10.0* 10.0*  HCT 32.5*  --  31.6* 32.2*  PLT 212  --  247 240  HEPARINUNFRC  --  0.47 0.34 0.28*  CREATININE 1.45*  --  1.58* 1.57*     Estimated Creatinine Clearance: 43.2 mL/min (A) (by C-G formula based on SCr of 1.57 mg/dL (H)).  Assessment: 75 yr old man admitted on 04/08/21 with critical limb ischemic of RLE.  He has permanent atrial fibrillation and bioprosthetic mitral valve replacement for which he was on Coumadin PTA.  DVT prophylaxis had been on hold due to recent retroperitoneal hematoma, for which he was given vit K and FFP (repeat CT on 8/5: remains unchanged).  Also recent dx of COVID infection.  Now with acute PE and bilateral DVTs, so Pharmacy consulted for IV heparin without boluses.  Heparin level is slightly sub-therapeutic.  No issue with heparin infusion nor bleeding per RN.  Goal of Therapy:  Heparin 0.3-0.5 units/ml (lower end of goal range d/t recent RP hematoma) Monitor platelets by anticoagulation protocol: Yes   Plan:  Increase heparin gtt to 1500 units/hr Check 8 hr heparin level Daily heparin level and CBC  Lailyn Appelbaum D. Mina Marble, PharmD, BCPS, [place] 05/06/2021, 7:56 AM

## 2021-05-06 NOTE — Progress Notes (Signed)
PROGRESS NOTE    Ryan [name]  [id number] DOB: 07-25-1946 DOA: 04/08/2021 PCP: [name], MD   Brief Narrative: Ryan [name] is a 75 y.o. male with a history of atrial fibrillation and mitral valve replacement on Coumadin, chronic systolic heart failure status post AICD, primary hypertension, peripheral vascular disease.  Patient presented secondary to right foot wound secondary to critical limb ischemia.  Vascular surgery was consulted.  Patient underwent partial right foot amputation with excisional debridement and wound VAC placement.  Patient continued to have worsening evidence of limb ischemia with vascular surgery now recommending above knee amputation.  During admission, patient developed significant AKI in setting of left hydronephrosis and likely ATN from hypotension.  Patient required hemodialysis transiently but this has been discontinued.   Assessment & Plan:   Principal Problem:   Critical lower limb ischemia (HCC) Active Problems:   Chronic systolic heart failure (HCC)   Status post mitral valve replacement with bioprosthetic valve   Diabetes mellitus ([place])   Essential hypertension   ICD (implantable cardioverter-defibrillator) in place   Stage 3 chronic kidney disease (HCC)   Pressure injury of skin   Acute respiratory failure with hypoxia Secondary to multiple issues including acute PE, pneumonia.  Patient required significantly elevated oxygen delivery up to 25 L/min on heated high flow nasal cannula and is now been weaned to about 2 L/min. -Wean oxygen to room air as able  Acute pulmonary embolism Acute LUE DVT In setting of COVID infection, discontinuation of Coumadin secondary to retroperitoneal hematoma/intramuscular hemorrhage in addition to prolonged hospitalization.  Diagnosed on 8/16 via perfusion scan which was consistent with right upper and right lower lobe PE.  Heparin IV restarted after discussion with vascular surgery. -Continue heparin  IV  Possible thrush May be contributing to pain with swallowing.  Does not appear to have esophageal symptoms. -Nystatin solution QID  Possible aspiration Initial concern for cause of hypoxia prior to diagnosis of PE.  With diagnosis of PE in addition to multifocal pneumonia from COVID-19, does not appear that aspiration pneumonia is likely.  Patient was treated empirically with Zosyn and vancomycin which have been discontinued.  Speech therapy was consulted and recommend regular diet with thin liquids.  COVID-19 pneumonia Multifocal pneumonia Resolved. Patient treated with a complete 5-day course remdesivir.  Currently on Decadron secondary to associated hypoxia. -Continue Decadron to complete 10-day course  Poor oral intake CorTrak placed. Dietitian consulted. Patient appears to have difficulty with eating secondary to pain with swallowing. Currently on tube feeds. Clear for diet from a speech therapy standpoint. -Continue tube feeds with plan to wean off and continue oral intake  Leukocytosis In setting of infection and steroid use.  Trending down.  Right critical lower limb ischemia Vascular surgery on board.  Plan is now for right AKA on 8/19.  Retroperitoneal hematoma Acute blood loss anemia CT scan of abdomen pelvis on 7/30 was significant for a large  (up to 17 cm) retroperitoneal bleed with associated left iliac [place] muscle intramuscular hemorrhage.  In setting of Coumadin use as an outpatient.  At that time, he was given vitamin K and FFP for therapeutic INR.  Repeat CT abdomen pelvis on 8/5 significant for unchanged hematoma.  Patient now restarted on anticoagulation for acute DVT and PEs. -Trend hemoglobin with daily CBC  AKI on CKD stage IIIb Likely from ATN from hypotension/retroperitoneal bleed with resultant displacement of left kidney with resultant hydronephrosis.  Patient is status post left percutaneous nephrostomy tube placement on  8/1.  Peak creatinine of 5.63.   Nephrology was consulted and patient was started on HD transiently with significant provement of renal function.  AKI now currently resolved and creatinine back to baseline.  Temporary dialysis catheter removed  Hydronephrosis As mentioned above.  Percutaneous nephrostomy tube placed on 8/1 with recommendation to keep in place for at least 2 months in addition to outpatient urology follow-up.  Chronic systolic heart failure Stable.  Permanent atrial fibrillation Status post mitral valve replacement with bioprosthetic valve Currently on heparin for anticoagulation.  Also on amiodarone. -Continue amiodarone and heparin IV  Diabetes mellitus type 2 Well-controlled with hemoglobin A1c of 5.4%.  Primary pretension He has been borderline hypotensive this admission.  No antihypertensives.  Hyperlipidemia -Continue Lipitor  Pressure injury Medial coccyx, unknown if present on admission.   DVT prophylaxis: Heparin IV Code Status:   Code Status: Full Code Family Communication: None at bedside Disposition Plan: Discharge likely to SNF in several days pending vascular surgery recommendations, stability of globin, transition to oral medications including anticoagulation, removal of NG tube and improvement of oral intake   Consultants:  Vascular surgery  Procedures:    Antimicrobials: Ceftriaxone IV Metronidazole IV Remdesivir IV Vancomycin IV   Subjective: Coughing with some sore throat. No dyspnea or chest pain. Hurts to swallow at times.  Objective: Vitals:   05/05/21 2327 05/06/21 0300 05/06/21 0500 05/06/21 0747  BP: (!) 92/31 (!) 100/40  (!) 92/25  Pulse: 65 66  62  Resp: '14 16  18  '$ Temp: 98.3 F (36.8 C) 98 F (36.7 C)  97.9 F (36.6 C)  TempSrc: Oral Oral  Oral  SpO2: 96% 98%  96%  Weight:   84.4 kg   Height:        Intake/Output Summary (Last 24 hours) at 05/06/2021 1003 Last data filed at 05/05/2021 2300 Gross per 24 hour  Intake 490 ml  Output 551 ml   Net -61 ml   Filed Weights   05/02/21 0337 05/03/21 0700 05/06/21 0500  Weight: 88 kg 88 kg 84.4 kg    Examination:  General exam: Appears calm and comfortable HEENT:  multiple white patches on buccal and tongue surfaces Respiratory system: Clear to auscultation. Respiratory effort normal. Cardiovascular system: S1 & S2 heard, RRR. No murmurs, rubs, gallops or clicks. Gastrointestinal system: Abdomen is distended, soft and nontender. No organomegaly or masses felt. Normal bowel sounds heard. Central nervous system: Alert and oriented. No focal neurological deficits. Musculoskeletal: No LE edema. Significant LUE pitting edema. No calf tenderness. Right foot in dressing Skin: No cyanosis. No rashes Psychiatry: Judgement and insight appear normal. Mood & affect appropriate.     Data Reviewed: I have personally reviewed following labs and imaging studies  CBC Lab Results  Component Value Date   WBC 12.0 (H) 05/06/2021   RBC 4.01 (L) 05/06/2021   HGB 10.0 (L) 05/06/2021   HCT 32.2 (L) 05/06/2021   MCV 80.3 05/06/2021   MCH 24.9 (L) 05/06/2021   PLT 240 05/06/2021   MCHC 31.1 05/06/2021   RDW 22.6 (H) 05/06/2021   LYMPHSABS 1,363 06/01/2016   MONOABS 517 06/01/2016   EOSABS 141 06/01/2016   BASOSABS 47 [id number]     Last metabolic panel Lab Results  Component Value Date   NA 143 05/06/2021   K 4.1 05/06/2021   CL 111 05/06/2021   CO2 23 05/06/2021   BUN 59 (H) 05/06/2021   CREATININE 1.57 (H) 05/06/2021   GLUCOSE 137 (H) 05/06/2021  GFRNONAA 45 (L) 05/06/2021   GFRAA 39 (L) 10/01/2020   CALCIUM 8.4 (L) 05/06/2021   PHOS 3.3 04/30/2021   PROT 5.8 (L) 05/04/2021   ALBUMIN 1.9 (L) 05/04/2021   LABGLOB 2.2 03/09/2021   BILITOT 1.1 05/04/2021   ALKPHOS 89 05/04/2021   AST 41 05/04/2021   ALT 29 05/04/2021   ANIONGAP 9 05/06/2021    CBG (last 3)  Recent Labs    05/05/21 2357 05/06/21 0451 05/06/21 0745  GLUCAP 156* 141* 148*     GFR: Estimated  Creatinine Clearance: 43.2 mL/min (A) (by C-G formula based on SCr of 1.57 mg/dL (H)).  Coagulation Profile: Recent Labs  Lab 05/01/21 0715  INR 1.4*    Recent Results (from the past 240 hour(s))  Resp Panel by RT-PCR (Flu A&B, Covid) Nasopharyngeal Swab     Status: Abnormal   Collection Time: 04/28/21  5:44 PM   Specimen: Nasopharyngeal Swab; Nasopharyngeal(NP) swabs in vial transport medium  Result Value Ref Range Status   SARS Coronavirus 2 by RT PCR POSITIVE (A) NEGATIVE Final    Comment: RESULT CALLED TO, READ BACK BY AND VERIFIED WITHDonetta Potts RN [id number] 04/28/21 A BROWNING (NOTE) SARS-CoV-2 target nucleic acids are DETECTED.  The SARS-CoV-2 RNA is generally detectable in upper respiratory specimens during the acute phase of infection. Positive results are indicative of the presence of the identified virus, but do not rule out bacterial infection or co-infection with other pathogens not detected by the test. Clinical correlation with patient history and other diagnostic information is necessary to determine patient infection status. The expected result is Negative.  Fact Sheet for Patients: EntrepreneurPulse.com.au  Fact Sheet for Healthcare Providers: IncredibleEmployment.be  This test is not yet approved or cleared by the [place] FDA and  has been authorized for detection and/or diagnosis of SARS-CoV-2 by FDA under an Emergency Use Authorization (EUA).  This EUA will remain in effect (meaning this test can  be used) for the duration of  the COVID-19 declaration under Section 564(b)(1) of the Act, 21 U.S.C. section 360bbb-3(b)(1), unless the authorization is terminated or revoked sooner.     Influenza A by PCR NEGATIVE NEGATIVE Final   Influenza B by PCR NEGATIVE NEGATIVE Final    Comment: (NOTE) The Xpert Xpress SARS-CoV-2/FLU/RSV plus assay is intended as an aid in the diagnosis of influenza from Nasopharyngeal swab  specimens and should not be used as a sole basis for treatment. Nasal washings and aspirates are unacceptable for Xpert Xpress SARS-CoV-2/FLU/RSV testing.  Fact Sheet for Patients: EntrepreneurPulse.com.au  Fact Sheet for Healthcare Providers: IncredibleEmployment.be  This test is not yet approved or cleared by the [place] FDA and has been authorized for detection and/or diagnosis of SARS-CoV-2 by FDA under an Emergency Use Authorization (EUA). This EUA will remain in effect (meaning this test can be used) for the duration of the COVID-19 declaration under Section 564(b)(1) of the Act, 21 U.S.C. section 360bbb-3(b)(1), unless the authorization is terminated or revoked.  Performed at [facility], [place] [address] [place], [place] [address]   MRSA Next Gen by PCR, Nasal     Status: Abnormal   Collection Time: 05/03/21 12:18 AM   Specimen: Nasal Mucosa; Nasal Swab  Result Value Ref Range Status   MRSA by PCR Next Gen POSITIVE (A) NOT DETECTED Final    Comment: CRITICAL RESULT CALLED TO, READ BACK BY AND VERIFIED WITH: RN Corie Chiquito [id number] '@[id number]'$  THANEY Performed at [facility], [place]  [address], [place], [place] [address]         Radiology Studies: NM Pulmonary Perfusion  Result Date: 05/05/2021 CLINICAL DATA:  PE suspected, high prob EXAM: NUCLEAR MEDICINE PERFUSION LUNG SCAN TECHNIQUE: Perfusion images were obtained in multiple projections after intravenous injection of radiopharmaceutical. Ventilation scans intentionally deferred if perfusion scan and chest x-ray adequate for interpretation during COVID 19 epidemic. RADIOPHARMACEUTICALS:  4.3 mCi Tc-95mMAA IV COMPARISON:  Chest radiograph earlier today. FINDINGS: There is a wedge-shaped perfusion defect in the right upper lobe, with additional wedge-shaped perfusion defect in the right lower lobe. Left lung perfusion is minimally heterogeneous but no wedge-shaped defects are  seen. Photopenia in the left upper lung zone is related to pacemaker. Cardiomegaly is seen. IMPRESSION: Positive for wedge-shaped perfusion defects in the right upper and right lower lobe consistent with pulmonary emboli. Examination is positive for pulmonary embolus. Electronically Signed   By: MKeith RakeM.D.   On: 05/05/2021 15:39   VAS [place]IVC/ILIAC (VENOUS ONLY)  Result Date: 05/04/2021 IVC/ILIAC STUDY Patient Name:  Ryan [name] Date of Exam:   05/04/2021 Medical Rec #: [id number]        Accession #:    [id number] of Birth: 319-Jun-1947        Patient Gender: M Patient Age:   89years [facility]      [facility] [place]IVC/ILIAC (VENOUS ONLY) Referring Phys: --------------------------------------------------------------------------------  Indications: DVT seen on lower extremity venous duplex Other Factors: COVID. Limitations: Air/bowel gas, obesity and hematoma at midline/along left iliacus muscle, seen on CT.  Comparison Study: No prior study Performing Technologist: MMaudry MayhewMHA, RDMS, RVT, RDCS  Examination Guidelines: A complete evaluation includes B-mode imaging, spectral Doppler, color Doppler, and power Doppler as needed of all accessible portions of each vessel. Bilateral testing is considered an integral part of a complete examination. Limited examinations for reoccurring indications may be performed as noted.  +----------------+---------+-----------+---------+-----------+----------------+       CIV       RT-PatentRT-ThrombusLT-PatentLT-Thrombus    Comments     +----------------+---------+-----------+---------+-----------+----------------+ Common Iliac                         patent                Unable to     Prox                                                    visualize right                                                            proximal CIV    +----------------+---------+-----------+---------+-----------+----------------+ Common Iliac     patent              patent                              Distal                                                                   +----------------+---------+-----------+---------+-----------+----------------+  +-------------------------+---------+-----------+---------+-----------+--------+  EIV           RT-PatentRT-ThrombusLT-PatentLT-ThrombusComments +-------------------------+---------+-----------+---------+-----------+--------+ External Iliac Vein Prox  patent              patent                      +-------------------------+---------+-----------+---------+-----------+--------+ External Iliac Vein Mid                       patent                      +-------------------------+---------+-----------+---------+-----------+--------+ External Iliac Vein       patent                         acute            Distal                                                                    +-------------------------+---------+-----------+---------+-----------+--------+   Summary: IVC/Iliac: There is evidence of acute thrombus involving the left external iliac vein.  *See table(s) above for measurements and observations.  Electronically signed by Monica Martinez MD on 05/04/2021 at 4:13:03 PM.    Final    DG Chest Port 1 View  Result Date: 05/04/2021 CLINICAL DATA:  Right PICC placement EXAM: PORTABLE CHEST 1 VIEW COMPARISON:  Chest radiograph from earlier today. FINDINGS: Right PICC terminates at the cavoatrial junction. Enteric tube enters stomach with the tip not seen on this image. Stable configuration of intact sternotomy wires, cardiac valvular prosthesis and 2 lead left subclavian ICD. Stable cardiomediastinal silhouette with mild cardiomegaly. No pneumothorax. Small bilateral pleural effusions are stable. Patchy hazy parahilar interstitial opacities, improved.  Stable left basilar atelectasis. IMPRESSION: 1. Right PICC terminates at the cavoatrial junction. 2. Improved patchy hazy parahilar interstitial opacities, compatible with improving cardiogenic pulmonary edema. 3. Stable small bilateral pleural effusions and left basilar atelectasis. Electronically Signed   By: Ilona Sorrel M.D.   On: 05/04/2021 19:10   ECHOCARDIOGRAM COMPLETE  Result Date: 05/05/2021    ECHOCARDIOGRAM REPORT   Patient Name:   Ryan [name] Date of Exam: 05/05/2021 Medical Rec #:  [id number]        Height:       72.0 in Accession #:    [id number]       Weight:       194.0 lb Date of Birth:  [birth year]        BSA:          2.103 m Patient Age:    2 years         BP:           94/65 mmHg Patient Gender: M                HR:           70 bpm. Exam Location:  Inpatient Procedure: 2D Echo, Color Doppler, Cardiac Doppler and Intracardiac            Opacification Agent Indications:    Positive D dimer [id number]  History:        Patient has prior history of Echocardiogram examinations, most  recent 03/04/2021. Defibrillator, PAD, Arrythmias:PVC,                 Supraventricular tachycardia and Atrial Fibrillation; Risk                 Factors:Hypertension and Diabetes. COVID. Dilated                 cardiomyopathy.                  Mitral Valve: 27 mm [facility] bioprosthetic valve valve is                 present in the mitral position. Procedure Date: 09/26/2012.  Sonographer:    Darlina Sicilian RDCS Referring Phys: [id number] [place]  1. Left ventricular ejection fraction, by estimation, is 25 to 30%. The left ventricle has severely decreased function. The left ventricle demonstrates global hypokinesis. The left ventricular internal cavity size was mildly dilated. Left ventricular diastolic parameters are indeterminate.  2. Device leads in RV/RA. Right ventricular systolic function is moderately reduced. The right ventricular size is moderately enlarged. There is  moderately elevated pulmonary artery systolic pressure.  3. Left atrial size was moderately dilated.  4. Right atrial size was mildly dilated.  5. 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 The mitral valve has been repaired/replaced. No evidence of mitral valve regurgitation. No evidence of mitral stenosis. There is a 27 mm [facility] bioprosthetic valve present in the mitral position. Procedure Date: 09/26/2012.  6. Tricuspid valve regurgitation is moderate.  7. The aortic valve is calcified. There is moderate calcification of the aortic valve. Aortic valve regurgitation is not visualized. Mild to moderate aortic valve sclerosis/calcification is present, without any evidence of aortic stenosis.  8. Aortic dilatation noted. There is mild dilatation of the aortic root, measuring 41 mm.  9. The inferior vena cava is normal in size with greater than 50% respiratory variability, suggesting right atrial pressure of 3 mmHg. FINDINGS  Left Ventricle: Left ventricular ejection fraction, by estimation, is 25 to 30%. The left ventricle has severely decreased function. The left ventricle demonstrates global hypokinesis. Definity contrast agent was given IV to delineate the left ventricular endocardial borders. The left ventricular internal cavity size was mildly dilated. There is no left ventricular hypertrophy. Left ventricular diastolic parameters are indeterminate. Right Ventricle: Device leads in RV/RA. The right ventricular size is moderately enlarged. No increase in right ventricular wall thickness. Right ventricular systolic function is moderately reduced. There is moderately elevated pulmonary artery systolic pressure. The tricuspid regurgitant velocity is 3.28 m/s, and with an assumed right atrial pressure of 15 mmHg, the estimated right ventricular systolic pressure is 123XX123 mmHg. Left Atrium: Left atrial size was moderately dilated. Right Atrium: Right atrial size was mildly dilated. Pericardium: There is no  evidence of pericardial effusion. Mitral Valve: Normal functioning bioprosthetic MVR no PVL. The mitral valve has been repaired/replaced. No evidence of mitral valve regurgitation. There is a 27 mm [facility] bioprosthetic valve present in the mitral position. Procedure Date: 09/26/2012. No evidence of mitral valve stenosis. MV peak gradient, 11.1 mmHg. The mean mitral valve gradient is 6.5 mmHg. Tricuspid Valve: The tricuspid valve is normal in structure. Tricuspid valve regurgitation is moderate . No evidence of tricuspid stenosis. Aortic Valve: The aortic valve is calcified. There is moderate calcification of the aortic valve. Aortic valve regurgitation is not visualized. Mild to moderate aortic valve sclerosis/calcification is present, without any evidence of aortic stenosis. Pulmonic Valve: The  pulmonic valve was normal in structure. Pulmonic valve regurgitation is mild. No evidence of pulmonic stenosis. Aorta: The aortic root is normal in size and structure and aortic dilatation noted. There is mild dilatation of the aortic root, measuring 41 mm. Venous: The inferior vena cava is normal in size with greater than 50% respiratory variability, suggesting right atrial pressure of 3 mmHg. IAS/Shunts: No atrial level shunt detected by color flow Doppler. Additional Comments: A device lead is visualized.  LEFT VENTRICLE PLAX 2D LVIDd:         5.00 cm      Diastology LVIDs:         4.10 cm      LV e' medial:    5.30 cm/s LV PW:         0.80 cm      LV E/e' medial:  24.9 LV IVS:        1.00 cm      LV e' lateral:   7.23 cm/s LVOT diam:     2.20 cm      LV E/e' lateral: 18.3 LV SV:         57 LV SV Index:   27 LVOT Area:     3.80 cm  LV Volumes (MOD) LV vol d, MOD A2C: 111.5 ml LV vol d, MOD A4C: 92.9 ml LV vol s, MOD A2C: 87.5 ml LV vol s, MOD A4C: 48.2 ml LV SV MOD A2C:     24.0 ml LV SV MOD A4C:     92.9 ml LV SV MOD BP:      35.8 ml RIGHT VENTRICLE RV S prime:     9.66 cm/s TAPSE (M-mode): 1.8 cm LEFT ATRIUM              Index       RIGHT ATRIUM           Index LA diam:        4.50 cm 2.14 cm/m  RA Area:     24.70 cm LA Vol (A2C):   41.1 ml 19.54 ml/m RA Volume:   73.50 ml  34.95 ml/m LA Vol (A4C):   36.0 ml 17.12 ml/m LA Biplane Vol: 40.3 ml 19.16 ml/m  AORTIC VALVE             PULMONIC VALVE LVOT Vmax:   76.50 cm/s  PR End Diast Vel: 12.39 msec LVOT Vmean:  45.500 cm/s LVOT VTI:    0.150 m  AORTA Ao Root diam: 4.10 cm Ao Asc diam:  3.85 cm MITRAL VALVE                TRICUSPID VALVE MV Area (PHT): 3.17 cm     TR Peak grad:   43.0 mmHg MV Area VTI:   1.15 cm     TR Vmax:        328.00 cm/s MV Peak grad:  11.1 mmHg MV Mean grad:  6.5 mmHg     SHUNTS MV Vmax:       1.66 m/s     Systemic VTI:  0.15 m MV Vmean:      121.5 cm/s   Systemic Diam: 2.20 cm MV Decel Time: 239 msec MV E velocity: 132.00 cm/s MV A velocity: 161.00 cm/s MV E/A ratio:  0.82 Jenkins Rouge MD Electronically signed by Jenkins Rouge MD Signature Date/Time: 05/05/2021/11:57:31 AM    Final    VAS [place] LOWER EXTREMITY VENOUS (DVT)  Result Date: 05/04/2021  Lower Venous DVT Study  Patient Name:  Ryan [name]  Date of Exam:   05/04/2021 Medical Rec #: [id number]         Accession #:    [id number] Date of Birth: 07-Oct-1945         Patient Gender: M Patient Age:   32 years Exam Location[facility]  [facility] Procedure:      VAS [place] LOWER EXTREMITY VENOUS (DVT) Referring Phys: Claiborne Billings GRIFFITH --------------------------------------------------------------------------------  Indications: Edema, and COVID.  Comparison Study: 04/22/21- negative left lower extremity venous duplex Performing Technologist: Maudry Mayhew MHA, RDMS, RVT, RDCS  Examination Guidelines: A complete evaluation includes B-mode imaging, spectral Doppler, color Doppler, and power Doppler as needed of all accessible portions of each vessel. Bilateral testing is considered an integral part of a complete examination. Limited examinations for reoccurring indications may be performed as noted.  The reflux portion of the exam is performed with the patient in reverse Trendelenburg.  +---------+---------------+---------+-----------+----------+--------------+ RIGHT    CompressibilityPhasicitySpontaneityPropertiesThrombus Aging +---------+---------------+---------+-----------+----------+--------------+ CFV      Full           Yes      Yes                                 +---------+---------------+---------+-----------+----------+--------------+ SFJ      Full                                                        +---------+---------------+---------+-----------+----------+--------------+ FV Prox  Full                                                        +---------+---------------+---------+-----------+----------+--------------+ FV Mid   Full                                                        +---------+---------------+---------+-----------+----------+--------------+ FV DistalFull                                                        +---------+---------------+---------+-----------+----------+--------------+ PFV      Full                                                        +---------+---------------+---------+-----------+----------+--------------+ POP      Full           Yes      Yes                                 +---------+---------------+---------+-----------+----------+--------------+ PTV      None  Yes                  Acute          +---------+---------------+---------+-----------+----------+--------------+ PERO     None                    No                   Acute          +---------+---------------+---------+-----------+----------+--------------+   +---------+---------------+---------+-----------+----------+--------------+ LEFT     CompressibilityPhasicitySpontaneityPropertiesThrombus Aging +---------+---------------+---------+-----------+----------+--------------+ CFV      None           Yes       Yes                  Acute          +---------+---------------+---------+-----------+----------+--------------+ SFJ      None                                         Acute          +---------+---------------+---------+-----------+----------+--------------+ FV Prox  None                                         Acute          +---------+---------------+---------+-----------+----------+--------------+ FV Mid   None                                         Acute          +---------+---------------+---------+-----------+----------+--------------+ FV DistalNone                    Yes                  Acute          +---------+---------------+---------+-----------+----------+--------------+ PFV      None                                         Acute          +---------+---------------+---------+-----------+----------+--------------+ POP      None           Yes      Yes                  Acute          +---------+---------------+---------+-----------+----------+--------------+ PTV      Full                    Yes        patent                   +---------+---------------+---------+-----------+----------+--------------+ PERO     None                    No                   Acute          +---------+---------------+---------+-----------+----------+--------------+     Summary: RIGHT: - Findings consistent with acute deep vein thrombosis  involving the right posterior tibial veins, and right peroneal veins.  LEFT: - Findings consistent with acute deep vein thrombosis involving the left common femoral vein, SF junction, left femoral vein, left proximal profunda vein, left popliteal vein, and left peroneal veins. - No cystic structure found in the popliteal fossa.  *See table(s) above for measurements and observations. Electronically signed by Monica Martinez MD on 05/04/2021 at 4:15:55 PM.    Final    VAS [place] UPPER EXTREMITY VENOUS DUPLEX  Result Date: 05/04/2021 UPPER  VENOUS STUDY  Patient Name:  Ryan [name]  Date of Exam:   05/04/2021 Medical Rec #: [id number]         Accession #:    [id number] Date of Birth: 06-Oct-1945         Patient Gender: M Patient Age:   78 years Exam Location:  [facility] Procedure:      VAS [place] UPPER EXTREMITY VENOUS DUPLEX Referring Phys: Claiborne Billings GRIFFITH --------------------------------------------------------------------------------  Indications: Edema Other Indications: COVID. Limitations: Poor ultrasound/tissue interface. Comparison Study: No prior study Performing Technologist: Maudry Mayhew MHA, RDMS, RVT, RDCS  Examination Guidelines: A complete evaluation includes B-mode imaging, spectral Doppler, color Doppler, and power Doppler as needed of all accessible portions of each vessel. Bilateral testing is considered an integral part of a complete examination. Limited examinations for reoccurring indications may be performed as noted.  Right Findings: +----------+------------+---------+-----------+----------+-------+ RIGHT     CompressiblePhasicitySpontaneousPropertiesSummary +----------+------------+---------+-----------+----------+-------+ Subclavian               Yes       Yes                      +----------+------------+---------+-----------+----------+-------+  Left Findings: +----------+------------+---------+-----------+----------+-------+ LEFT      CompressiblePhasicitySpontaneousPropertiesSummary +----------+------------+---------+-----------+----------+-------+ IJV           Full       Yes       Yes                      +----------+------------+---------+-----------+----------+-------+ Subclavian    None                 No                Acute  +----------+------------+---------+-----------+----------+-------+ Axillary      None                 No                Acute  +----------+------------+---------+-----------+----------+-------+ Brachial      None                 No                 Acute  +----------+------------+---------+-----------+----------+-------+ Radial        None                 Yes               Acute  +----------+------------+---------+-----------+----------+-------+ Ulnar         None                 No                Acute  +----------+------------+---------+-----------+----------+-------+ Cephalic      Full                                          +----------+------------+---------+-----------+----------+-------+  Basilic       None                                   Acute  +----------+------------+---------+-----------+----------+-------+  Summary:  Right: No evidence of thrombosis in the subclavian.  Left: Findings consistent with acute deep vein thrombosis involving the left subclavian vein, left axillary vein, left brachial veins, left radial veins and left ulnar veins. Findings consistent with acute superficial vein thrombosis involving the left basilic vein.  *See table(s) above for measurements and observations.  Diagnosing physician: Monica Martinez MD Electronically signed by Monica Martinez MD on 05/04/2021 at 4:12:40 PM.    Final    [place] EKG SITE RITE  Result Date: 05/04/2021 If Site Rite image not attached, placement could not be confirmed due to current cardiac rhythm.       Scheduled Meds:  amiodarone  200 mg Oral Daily   vitamin C  500 mg Oral Daily   atorvastatin  40 mg Oral q1800   Chlorhexidine Gluconate Cloth  6 each Topical Daily   dexamethasone  6 mg Oral Daily   dextromethorphan-guaiFENesin  1 tablet Oral BID   docusate sodium  100 mg Oral BID   feeding supplement  237 mL Oral BID BM   feeding supplement (OSMOLITE 1.5 CAL)  1,000 mL Per Tube Q24H   feeding supplement (PROSource TF)  45 mL Per Tube TID   insulin aspart  0-15 Units Subcutaneous TID WC   Ipratropium-Albuterol  1 puff Inhalation BID   midodrine  10 mg Oral TID WC   multivitamin with minerals  1 tablet Oral Daily   pantoprazole  40 mg  Oral Daily   sodium chloride flush  10-40 mL Intracatheter Q12H   sodium chloride flush  3 mL Intravenous Q12H   zinc sulfate  220 mg Oral Daily   Continuous Infusions:  heparin 1,500 Units/hr (05/06/21 0816)     LOS: 28 days     Cordelia Poche, MD [facility] 05/06/2021, 10:03 AM  If 7PM-7AM, please contact night-coverage [email or web address]

## 2021-05-06 NOTE — Progress Notes (Signed)
[place] for IV Heparin Indication:  VTE Treatment  Allergies  Allergen Reactions   Tuna [Fish Allergy] Nausea And Vomiting    Patient Measurements: Height: 6' (182.9 cm) Weight: 84.4 kg (186 lb) IBW/kg (Calculated) : 77.6 Heparin Dosing Weight:  88 kg  Vital Signs: Temp: 97.6 F (36.4 C) (08/17 1623) Temp Source: Oral (08/17 1623) BP: 105/65 (08/17 1623) Pulse Rate: 66 (08/17 1623)  Labs: Recent Labs    05/04/21 0500 05/05/21 0050 05/05/21 0540 05/06/21 0300 05/06/21 1700  HGB 9.9*  --  10.0* 10.0*  --   HCT 32.5*  --  31.6* 32.2*  --   PLT 212  --  247 240  --   HEPARINUNFRC  --    < > 0.34 0.28* 0.15*  CREATININE 1.45*  --  1.58* 1.57*  --    < > = values in this interval not displayed.    Estimated Creatinine Clearance: 43.2 mL/min (A) (by C-G formula based on SCr of 1.57 mg/dL (H)).  Assessment: 75 yr old man admitted on 04/08/21 with critical limb ischemic of RLE.  Pt with permanent atrial fibrillation, also with mitral valve replacement with bioprosthetic valve (on warfarin PTA for afib, on hold). Also recent dx of COVID infection.  DVT prophylaxis had been on hold due to recent retroperitoneal hematoma, for which he was given vit K and FFP (repeat CT on 8/5 remained unchanged).  Now with acute PE and bilateral DVTs, so pharmacy was consulted for IV heparin without boluses.  Heparin level ~9 hrs after heparin infusion was increased to 1500 units/hr was 0.15 units/ml, which is below the goal range for this pt. H/H 10.0/32.2, plt 240 (CBC stable). Per RN, no issues with IV or bleeding observed.  VVS planning for R AKA, likely Friday 8/19.  Goal of Therapy:  Heparin 0.3-0.5 units/ml (lower end of goal range due to recent hematoma) Monitor platelets by anticoagulation protocol: Yes   Plan:  Increase heparin gtt to 1650 units/hr Check 8-hr heparin level Monitor daily heparin level and CBC Monitor for bleeding F/U OR  schedule for R AKA  Gillermina Hu, PharmD, BCPS, [facility] Clinical Pharmacist 05/06/2021, 7:08 PM

## 2021-05-06 NOTE — Progress Notes (Signed)
Physical Therapy Treatment Patient Details Name: Ryan [name] MRN: [id number] DOB: November 11, 1945 Today's Date: 05/06/2021    History of Present Illness Pt 75 y.o. 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 Scheduled to have debridement on 7/20 but due to an INR of 3.2 surgery was postponed. He eventually underwent partial foot/toe amputation with excisional debridement and wound VAC placement on 7/21, left lower extremity angiogram in 7/25, and R IJ temp HD catheter placement on 8/1.  Pt with L sided hydronephrosis possibly secondary to L retroperitoneal hematoma, s/p nephrostomy tube on 8/1 to drain. Now on airborne/contact precautions, positive Covid-19 test collected 8/9. 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    PT Comments    Patient received in supine, resting. Agrees to PT session. L UE swollen (due to DVT?) Patient performed supine LE exercises with assist, requires mod assist for bed mobility. Performed additional seated exercises with LE and UE with assist. Patient has weaker L LE than right. R LE to be amputated possibly Friday. Patient will continue to benefit from skilled PT while here to improve functional independence and strength.     Follow Up Recommendations  SNF;Supervision for mobility/OOB;Supervision/Assistance - 24 hour     Equipment Recommendations  Wheelchair (measurements PT);Wheelchair cushion (measurements PT);Other (comment) (TBD)    Recommendations for Other Services       Precautions / Restrictions Precautions Precautions: Fall Precaution Comments: Airborne/Contact precs, L nephrostomy tube/drain, cortrak Restrictions Weight Bearing Restrictions: Yes RLE Weight Bearing: Non weight bearing    Mobility  Bed Mobility Overal bed mobility: Needs Assistance Bed Mobility: Supine to  Sit;Sit to Supine     Supine to sit: Mod assist Sit to supine: Mod assist   General bed mobility comments: Able tos it with supervision at bedside    Transfers                 General transfer comment: not attempted this date, no +2 assist  Ambulation/Gait             General Gait Details: unable   Stairs             Wheelchair Mobility    Modified Rankin (Stroke Patients Only)       Balance Overall balance assessment: Needs assistance Sitting-balance support: Feet supported Sitting balance-Leahy Scale: Fair Sitting balance - Comments: Supervision for seated balance                                    Cognition Arousal/Alertness: Awake/alert Behavior During Therapy: Flat affect Overall Cognitive Status: No family/caregiver present to determine baseline cognitive functioning Area of Impairment: Safety/judgement;Awareness;Problem solving                       Following Commands: Follows one step commands with increased time Safety/Judgement: Decreased awareness of deficits Awareness: Intellectual Problem Solving: Slow processing;Decreased initiation;Difficulty sequencing;Requires verbal cues;Requires tactile cues General Comments: Patient seems overall to have decreased awarenss of limitations/health      Exercises Other Exercises Other Exercises: Seated BLE AAROM: LAQ x10 reps; marching with assist on left LE, supine heel slides, SLR, hip abd/add with assist x 10 Other Exercises: UE shoulder flexion x 10 and elbow flexion x10 B    General Comments  Pertinent Vitals/Pain Pain Assessment: Faces Faces Pain Scale: Hurts a little bit Pain Location: LEs, back Pain Descriptors / Indicators: Discomfort;Grimacing;Guarding;Sore Pain Intervention(s): Monitored during session;Repositioned    Home Living                      Prior Function            PT Goals (current goals can now be found in the care  plan section) Acute Rehab PT Goals Patient Stated Goal: to get stronger PT Goal Formulation: With patient Time For Goal Achievement: 05/20/21 Potential to Achieve Goals: Fair Progress towards PT goals: Progressing toward goals    Frequency    Min 2X/week      PT Plan Current plan remains appropriate    Co-evaluation              AM-PAC PT "6 Clicks" Mobility   Outcome Measure  Help needed turning from your back to your side while in a flat bed without using bedrails?: A Lot Help needed moving from lying on your back to sitting on the side of a flat bed without using bedrails?: A Lot Help needed moving to and from a bed to a chair (including a wheelchair)?: Total Help needed standing up from a chair using your arms (e.g., wheelchair or bedside chair)?: Total Help needed to walk in hospital room?: Total Help needed climbing 3-5 steps with a railing? : Total 6 Click Score: 8    End of Session Equipment Utilized During Treatment: Oxygen Activity Tolerance: Patient tolerated treatment well;Patient limited by fatigue Patient left: in bed;with call bell/phone within reach Nurse Communication: Mobility status PT Visit Diagnosis: Other abnormalities of gait and mobility (R26.89);Muscle weakness (generalized) (M62.81);Pain Pain - Right/Left: Right Pain - part of body: Leg     Time: 1420-1447 PT Time Calculation (min) (ACUTE ONLY): 27 min  Charges:  $Therapeutic Exercise: 8-22 mins $Therapeutic Activity: 8-22 mins                    [employer], PT, GCS 05/06/21,3:32 PM

## 2021-05-07 ENCOUNTER — Encounter (HOSPITAL_COMMUNITY): Payer: Self-pay | Admitting: Certified Registered Nurse Anesthetist

## 2021-05-07 DIAGNOSIS — I10 Essential (primary) hypertension: Secondary | ICD-10-CM | POA: Diagnosis not present

## 2021-05-07 DIAGNOSIS — I5022 Chronic systolic (congestive) heart failure: Secondary | ICD-10-CM | POA: Diagnosis not present

## 2021-05-07 DIAGNOSIS — Z95810 Presence of automatic (implantable) cardiac defibrillator: Secondary | ICD-10-CM | POA: Diagnosis not present

## 2021-05-07 DIAGNOSIS — I70229 Atherosclerosis of native arteries of extremities with rest pain, unspecified extremity: Secondary | ICD-10-CM | POA: Diagnosis not present

## 2021-05-07 LAB — CBC
HCT: 32.9 % — ABNORMAL LOW (ref 39.0–52.0)
Hemoglobin: 10.1 g/dL — ABNORMAL LOW (ref 13.0–17.0)
MCH: 24.6 pg — ABNORMAL LOW (ref 26.0–34.0)
MCHC: 30.7 g/dL (ref 30.0–36.0)
MCV: 80.2 fL (ref 80.0–100.0)
Platelets: 247 10*3/uL (ref 150–400)
RBC: 4.1 MIL/uL — ABNORMAL LOW (ref 4.22–5.81)
RDW: 22.8 % — ABNORMAL HIGH (ref 11.5–15.5)
WBC: 12 10*3/uL — ABNORMAL HIGH (ref 4.0–10.5)
nRBC: 0.2 % (ref 0.0–0.2)

## 2021-05-07 LAB — HEPARIN LEVEL (UNFRACTIONATED)
Heparin Unfractionated: 0.4 IU/mL (ref 0.30–0.70)
Heparin Unfractionated: 0.32 IU/mL (ref 0.30–0.70)

## 2021-05-07 LAB — GLUCOSE, CAPILLARY
Glucose-Capillary: 129 mg/dL — ABNORMAL HIGH (ref 70–99)
Glucose-Capillary: 158 mg/dL — ABNORMAL HIGH (ref 70–99)
Glucose-Capillary: 164 mg/dL — ABNORMAL HIGH (ref 70–99)
Glucose-Capillary: 165 mg/dL — ABNORMAL HIGH (ref 70–99)
Glucose-Capillary: 169 mg/dL — ABNORMAL HIGH (ref 70–99)
Glucose-Capillary: 169 mg/dL — ABNORMAL HIGH (ref 70–99)

## 2021-05-07 LAB — C-REACTIVE PROTEIN: CRP: 7 mg/dL — ABNORMAL HIGH (ref <1.0)

## 2021-05-07 LAB — D-DIMER, QUANTITATIVE: D-Dimer, Quant: 11.03 ug/mL-FEU — ABNORMAL HIGH (ref 0.00–0.50)

## 2021-05-07 MED ORDER — SODIUM CHLORIDE 0.9 % IV SOLN
1.5000 g | INTRAVENOUS | Status: AC
  Filled 2021-05-07: qty 1.5

## 2021-05-07 MED ORDER — SODIUM CHLORIDE 0.9 % IV SOLN
1.5000 g | INTRAVENOUS | Status: DC
  Filled 2021-05-07: qty 1.5

## 2021-05-07 MED ORDER — BENZONATATE 100 MG PO CAPS
100.0000 mg | ORAL_CAPSULE | Freq: Three times a day (TID) | ORAL | Status: DC
  Administered 2021-05-07 – 2021-05-18 (×32): 100 mg via ORAL
  Filled 2021-05-07 (×32): qty 1

## 2021-05-07 NOTE — Progress Notes (Signed)
[place] for IV Heparin Indication:  VTE Treatment  Allergies  Allergen Reactions   Tuna [Fish Allergy] Nausea And Vomiting    Patient Measurements: Height: 6' (182.9 cm) Weight: 84.4 kg (186 lb) IBW/kg (Calculated) : 77.6 Heparin Dosing Weight:  88 kg  Vital Signs: Temp: 98 F (36.7 C) (08/18 0344) Temp Source: Oral (08/18 0344) BP: 94/33 (08/18 0344) Pulse Rate: 64 (08/18 0344)  Labs: Recent Labs    05/05/21 0540 05/06/21 0300 05/06/21 1700 05/07/21 0350  HGB 10.0* 10.0*  --  10.1*  HCT 31.6* 32.2*  --  32.9*  PLT 247 240  --  247  HEPARINUNFRC 0.34 0.28* 0.15* 0.40  CREATININE 1.58* 1.57*  --   --     Estimated Creatinine Clearance: 43.2 mL/min (A) (by C-G formula based on SCr of 1.57 mg/dL (H)).  Assessment: 75 yr old man admitted on 04/08/21 with critical limb ischemic of RLE.  Pt with permanent atrial fibrillation, also with mitral valve replacement with bioprosthetic valve (on warfarin PTA for afib, on hold). Also recent dx of COVID infection.  DVT prophylaxis had been on hold due to recent retroperitoneal hematoma, for which he was given vit K and FFP (repeat CT on 8/5 remained unchanged).  Now with acute PE and bilateral DVTs, so pharmacy was consulted for IV heparin without boluses. VVS planning for R AKA, likely Friday 8/19.  Heparin level therapeutic (0.4) on gtt at 1650 units/hr. No bleeding noted.  Goal of Therapy:  Heparin 0.3-0.5 units/ml (lower end of goal range due to recent hematoma) Monitor platelets by anticoagulation protocol: Yes   Plan:  Continue heparin gtt at 1650 units/hr F/u 6 hr confirmatory heparin level F/U OR schedule for R AKA  Sherlon Handing, PharmD, BCPS Please see amion for complete clinical pharmacist phone list 05/07/2021, 5:33 AM

## 2021-05-07 NOTE — Progress Notes (Signed)
    NPO past MN for right AKA  Patient agrees with plan  Roxy Horseman PA-C

## 2021-05-07 NOTE — Progress Notes (Signed)
ANTICOAGULATION CONSULT NOTE  Pharmacy Consult for IV Heparin Indication:  VTE Treatment  Allergies  Allergen Reactions   Tuna [Fish Allergy] Nausea And Vomiting    Patient Measurements: Height: 6' (182.9 cm) Weight: 84.4 kg (186 lb) IBW/kg (Calculated) : 77.6 Heparin Dosing Weight:  88 kg  Vital Signs: Temp: 98.4 F (36.9 C) (08/18 1304) Temp Source: Oral (08/18 1304) BP: 107/50 (08/18 1304) Pulse Rate: 67 (08/18 1304)  Labs: Recent Labs    05/05/21 0540 05/06/21 0300 05/06/21 1700 05/07/21 0350 05/07/21 1330  HGB 10.0* 10.0*  --  10.1*  --   HCT 31.6* 32.2*  --  32.9*  --   PLT 247 240  --  247  --   HEPARINUNFRC 0.34 0.28* 0.15* 0.40 0.32  CREATININE 1.58* 1.57*  --   --   --     Estimated Creatinine Clearance: 43.2 mL/min (A) (by C-G formula based on SCr of 1.57 mg/dL (H)).  Assessment: 75 yr old man admitted on 04/08/21 with critical limb ischemic of RLE.  Pt with permanent atrial fibrillation, also with mitral valve replacement with bioprosthetic valve (on warfarin PTA for afib, on hold). Also recent dx of COVID infection.  DVT prophylaxis had been on hold due to recent retroperitoneal hematoma, for which he was given vit K and FFP (repeat CT on 8/5 remained unchanged).  Now with acute PE and bilateral DVTs, so pharmacy was consulted for IV heparin without boluses.  Heparin level remains therapeutic and is trending down.  No bleeding noted.  Goal of Therapy:  Heparin 0.3-0.5 units/ml (lower end of goal range due to recent hematoma) Monitor platelets by anticoagulation protocol: Yes   Plan:  Increase heparin gtt slightly to 1700 units/hr Daily heparin level and CBC VVS planning for R AKA on 8/19, f/u with order to hold heparin before OR  Jenae Tomasello D. Mina Marble, PharmD, BCPS, [place] 05/07/2021, 2:51 PM

## 2021-05-07 NOTE — Progress Notes (Signed)
PROGRESS NOTE    ZOEL BASSANI  [id number] DOB: July 05, 1946 DOA: 04/08/2021 PCP: [name], MD   Brief Narrative: Ryan [name] is a 75 y.o. male with a history of atrial fibrillation and mitral valve replacement on Coumadin, chronic systolic heart failure status post AICD, primary hypertension, peripheral vascular disease.  Patient presented secondary to right foot wound secondary to critical limb ischemia.  Vascular surgery was consulted.  Patient underwent partial right foot amputation with excisional debridement and wound VAC placement.  Patient continued to have worsening evidence of limb ischemia with vascular surgery now recommending above knee amputation.  During admission, patient developed significant AKI in setting of left hydronephrosis and likely ATN from hypotension.  Patient required hemodialysis transiently but this has been discontinued.   Assessment & Plan:   Principal Problem:   Critical lower limb ischemia (HCC) Active Problems:   Chronic systolic heart failure (HCC)   Status post mitral valve replacement with bioprosthetic valve   Diabetes mellitus ([place])   Essential hypertension   ICD (implantable cardioverter-defibrillator) in place   Stage 3 chronic kidney disease (HCC)   Pressure injury of skin   Acute respiratory failure with hypoxia Secondary to multiple issues including acute PE, pneumonia.  Patient required significantly elevated oxygen delivery up to 25 L/min on heated high flow nasal cannula and is now been weaned to about 2 L/min. -Wean oxygen to room air as able  Acute pulmonary embolism Acute LUE DVT In setting of COVID infection, discontinuation of Coumadin secondary to retroperitoneal hematoma/intramuscular hemorrhage in addition to prolonged hospitalization.  Diagnosed on 8/16 via perfusion scan which was consistent with right upper and right lower lobe PE.  Heparin IV restarted after discussion with vascular surgery. -Continue heparin  IV  Possible thrush May be contributing to pain with swallowing.  Does not appear to have esophageal symptoms. Symptoms improving. -Nystatin solution QID  Possible aspiration Initial concern for cause of hypoxia prior to diagnosis of PE.  With diagnosis of PE in addition to multifocal pneumonia from COVID-19, does not appear that aspiration pneumonia is likely.  Patient was treated empirically with Zosyn and vancomycin which have been discontinued.  Speech therapy was consulted and recommend regular diet with thin liquids.  COVID-19 pneumonia Multifocal pneumonia Resolved. Patient treated with a complete 5-day course remdesivir.  Currently on Decadron secondary to associated hypoxia. -Continue Decadron to complete 10-day course  Poor oral intake CorTrak placed. Dietitian consulted. Patient appears to have difficulty with eating secondary to pain with swallowing. Currently on tube feeds. Clear for diet from a speech therapy standpoint. -Continue tube feeds with plan to wean off and continue oral intake  Leukocytosis In setting of infection and steroid use.  Trending down.  Right critical lower limb ischemia Vascular surgery on board.  Plan is now for right AKA on 8/19.  Retroperitoneal hematoma Acute blood loss anemia CT scan of abdomen pelvis on 7/30 was significant for a large  (up to 17 cm) retroperitoneal bleed with associated left iliac [place] muscle intramuscular hemorrhage.  In setting of Coumadin use as an outpatient.  At that time, he was given vitamin K and FFP for therapeutic INR.  Repeat CT abdomen pelvis on 8/5 significant for unchanged hematoma.  Patient now restarted on anticoagulation for acute DVT and PEs. -Trend hemoglobin with daily CBC  AKI on CKD stage IIIb Likely from ATN from hypotension/retroperitoneal bleed with resultant displacement of left kidney with resultant hydronephrosis.  Patient is status post left percutaneous nephrostomy tube  placement on 8/1.  Peak  creatinine of 5.63.  Nephrology was consulted and patient was started on HD transiently with significant provement of renal function.  AKI now currently resolved and creatinine back to baseline.  Temporary dialysis catheter removed  Hydronephrosis As mentioned above.  Percutaneous nephrostomy tube placed on 8/1 with recommendation to keep in place for at least 2 months in addition to outpatient urology follow-up.  Chronic systolic heart failure Stable.  Permanent atrial fibrillation Status post mitral valve replacement with bioprosthetic valve Currently on heparin for anticoagulation.  Also on amiodarone. -Continue amiodarone and heparin IV  Diabetes mellitus type 2 Well-controlled with hemoglobin A1c of 5.4%.  Primary pretension He has been borderline hypotensive this admission.  No antihypertensives.  Hyperlipidemia -Continue Lipitor  Pressure injury Medial coccyx, unknown if present on admission.   DVT prophylaxis: Heparin IV Code Status:   Code Status: Full Code Family Communication: None at bedside. Declined for me to call family. Disposition Plan: Discharge likely to SNF in several days pending vascular surgery recommendations, stability of hemoglobin, transition to oral medications including anticoagulation, removal of NG tube and improvement of oral intake   Consultants:  Vascular surgery  Procedures:    Antimicrobials: Ceftriaxone IV Metronidazole IV Remdesivir IV Vancomycin IV   Subjective: Coughing persists. Swallowing has improved slightly.  Objective: Vitals:   05/07/21 0344 05/07/21 0403 05/07/21 0800 05/07/21 1304  BP: (!) 94/33  (!) 94/42 (!) 107/50  Pulse: 64  67 67  Resp: '16  16 18  '$ Temp: 98 F (36.7 C)  98 F (36.7 C) 98.4 F (36.9 C)  TempSrc: Oral  Oral Oral  SpO2: 98%  95% 98%  Weight:  84.4 kg    Height:        Intake/Output Summary (Last 24 hours) at 05/07/2021 1603 Last data filed at 05/07/2021 0900 Gross per 24 hour  Intake --   Output 900 ml  Net -900 ml    Filed Weights   05/03/21 0700 05/06/21 0500 05/07/21 0403  Weight: 88 kg 84.4 kg 84.4 kg    Examination:  General exam: Appears calm and comfortable and in no acute distress. Conversant Respiratory: Clear to auscultation with disposable stethoscope. Respiratory effort normal with no intercostal retractions or use of accessory muscles Cardiovascular: S1 & S2 heard, RRR. No murmurs, rubs, gallops or clicks. No LE edema. Significant LUE edema Gastrointestinal: Abdomen is nondistended, soft and nontender. No masses felt. Decreased bowel sounds heard Neurologic: No focal neurological deficits Musculoskeletal: No calf tenderness. Right foot in gauze bandages Skin: No cyanosis. No new rashes Psychiatry: Alert and oriented. Memory intact. Mood & affect appropriate    Data Reviewed: I have personally reviewed following labs and imaging studies  CBC Lab Results  Component Value Date   WBC 12.0 (H) 05/07/2021   RBC 4.10 (L) 05/07/2021   HGB 10.1 (L) 05/07/2021   HCT 32.9 (L) 05/07/2021   MCV 80.2 05/07/2021   MCH 24.6 (L) 05/07/2021   PLT 247 05/07/2021   MCHC 30.7 05/07/2021   RDW 22.8 (H) 05/07/2021   LYMPHSABS 1,363 06/01/2016   MONOABS 517 06/01/2016   EOSABS 141 06/01/2016   BASOSABS 47 [id number]     Last metabolic panel Lab Results  Component Value Date   NA 143 05/06/2021   K 4.1 05/06/2021   CL 111 05/06/2021   CO2 23 05/06/2021   BUN 59 (H) 05/06/2021   CREATININE 1.57 (H) 05/06/2021   GLUCOSE 137 (H) 05/06/2021   GFRNONAA 45 (L)  05/06/2021   GFRAA 39 (L) 10/01/2020   CALCIUM 8.4 (L) 05/06/2021   PHOS 3.3 04/30/2021   PROT 5.8 (L) 05/04/2021   ALBUMIN 1.9 (L) 05/04/2021   LABGLOB 2.2 03/09/2021   BILITOT 1.1 05/04/2021   ALKPHOS 89 05/04/2021   AST 41 05/04/2021   ALT 29 05/04/2021   ANIONGAP 9 05/06/2021    CBG (last 3)  Recent Labs    05/07/21 0845 05/07/21 1259 05/07/21 1548  GLUCAP 165* 129* 169*       GFR: Estimated Creatinine Clearance: 43.2 mL/min (A) (by C-G formula based on SCr of 1.57 mg/dL (H)).  Coagulation Profile: Recent Labs  Lab 05/01/21 0715  INR 1.4*     Recent Results (from the past 240 hour(s))  Resp Panel by RT-PCR (Flu A&B, Covid) Nasopharyngeal Swab     Status: Abnormal   Collection Time: 04/28/21  5:44 PM   Specimen: Nasopharyngeal Swab; Nasopharyngeal(NP) swabs in vial transport medium  Result Value Ref Range Status   SARS Coronavirus 2 by RT PCR POSITIVE (A) NEGATIVE Final    Comment: RESULT CALLED TO, READ BACK BY AND VERIFIED WITHDonetta Potts RN [id number] 04/28/21 A BROWNING (NOTE) SARS-CoV-2 target nucleic acids are DETECTED.  The SARS-CoV-2 RNA is generally detectable in upper respiratory specimens during the acute phase of infection. Positive results are indicative of the presence of the identified virus, but do not rule out bacterial infection or co-infection with other pathogens not detected by the test. Clinical correlation with patient history and other diagnostic information is necessary to determine patient infection status. The expected result is Negative.  Fact Sheet for Patients: EntrepreneurPulse.com.au  Fact Sheet for Healthcare Providers: IncredibleEmployment.be  This test is not yet approved or cleared by the [place] FDA and  has been authorized for detection and/or diagnosis of SARS-CoV-2 by FDA under an Emergency Use Authorization (EUA).  This EUA will remain in effect (meaning this test can  be used) for the duration of  the COVID-19 declaration under Section 564(b)(1) of the Act, 21 U.S.C. section 360bbb-3(b)(1), unless the authorization is terminated or revoked sooner.     Influenza A by PCR NEGATIVE NEGATIVE Final   Influenza B by PCR NEGATIVE NEGATIVE Final    Comment: (NOTE) The Xpert Xpress SARS-CoV-2/FLU/RSV plus assay is intended as an aid in the diagnosis of influenza from  Nasopharyngeal swab specimens and should not be used as a sole basis for treatment. Nasal washings and aspirates are unacceptable for Xpert Xpress SARS-CoV-2/FLU/RSV testing.  Fact Sheet for Patients: EntrepreneurPulse.com.au  Fact Sheet for Healthcare Providers: IncredibleEmployment.be  This test is not yet approved or cleared by the [place] FDA and has been authorized for detection and/or diagnosis of SARS-CoV-2 by FDA under an Emergency Use Authorization (EUA). This EUA will remain in effect (meaning this test can be used) for the duration of the COVID-19 declaration under Section 564(b)(1) of the Act, 21 U.S.C. section 360bbb-3(b)(1), unless the authorization is terminated or revoked.  Performed at [facility], [place] [address] [place], [place] [address]   MRSA Next Gen by PCR, Nasal     Status: Abnormal   Collection Time: 05/03/21 12:18 AM   Specimen: Nasal Mucosa; Nasal Swab  Result Value Ref Range Status   MRSA by PCR Next Gen POSITIVE (A) NOT DETECTED Final    Comment: CRITICAL RESULT CALLED TO, READ BACK BY AND VERIFIED WITH: RN Corie Chiquito [id number] [id number]'$  THANEY Performed at [facility], [place] [address],  [place], [place] [address]          Radiology Studies: No results found.      Scheduled Meds:  amiodarone  200 mg Oral Daily   vitamin C  500 mg Oral Daily   atorvastatin  40 mg Oral q1800   Chlorhexidine Gluconate Cloth  6 each Topical Daily   dexamethasone  6 mg Oral Daily   dextromethorphan-guaiFENesin  1 tablet Oral BID   docusate sodium  100 mg Oral BID   feeding supplement  237 mL Oral BID BM   feeding supplement (OSMOLITE 1.5 CAL)  1,000 mL Per Tube Q24H   feeding supplement (PROSource TF)  45 mL Per Tube TID   insulin aspart  0-15 Units Subcutaneous TID WC   Ipratropium-Albuterol  1 puff Inhalation BID   midodrine  10 mg Oral TID WC   multivitamin with minerals  1 tablet Oral Daily   nystatin   5 mL Oral QID   pantoprazole  40 mg Oral Daily   sodium chloride flush  10-40 mL Intracatheter Q12H   sodium chloride flush  3 mL Intravenous Q12H   zinc sulfate  220 mg Oral Daily   Continuous Infusions:  [START ON 05/08/2021] cefUROXime (ZINACEF)  IV     heparin 1,700 Units/hr (05/07/21 1700)     LOS: 29 days     Cordelia Poche, MD [facility] 05/07/2021, 4:03 PM  If 7PM-7AM, please contact night-coverage [email or web address]

## 2021-05-07 NOTE — Progress Notes (Signed)
  Speech Language Pathology Treatment: Dysphagia  Patient Details Name: Ryan [name] MRN: [id number] DOB: Feb 14, 1946 Today's Date: 05/07/2021 Time: [id number] SLP Time Calculation (min) (ACUTE ONLY): 27 min  Assessment / Plan / Recommendation Clinical Impression  Pt seen for f/u dysphagia treatment, and with assist from RN, was repositioned upright in bed for safe PO intake. Note pt presented with baseline cough prior to POs, self suctioning secretions at anterior oral cavity. Trials of dysphagia 3 solids resulted in prolonged mastication and bolus formation. Oral clearance of solid was noted post intake and SPO2 remained in mid 90s. Pt reports taking a long time to masticate most regular solids across mealtimes and that this causes fatigue. Recommend dysphagia 2/thin liquid diet with SLP to continue f/u and advance diet as indicated. Pt and RN agreed to recommendations this date.    HPI HPI: Patient is a 75  y.o. male with PMH: atrial fibrillation on Coumadin mitral valve replacement, chronic systolic heart failure with an AICD and placed, essential hypertension peripheral vascular disease, also history of peripheral vascular bypass with an ongoing wound infection initially hospitalized for foot debridement and possible BKA. He had been scheduled to have debridement on 04/08/2021 but due to an INR of 3.2 surgery was postponed.  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 He developed a retroperitoneal hematoma with left kidney displacement and hypotension developed ATN and hyperkalemia, nephrology was consulted recommended renal replacement therapy, urology was consulted due to his left hydronephrosis would like recommended a left nephrostomy tube.  Nephrostomy tube placed on 8/1 and central line placement for HD.  Patient did well after 1 HD treatment and renal function recovered.  During  screening status for SNF placement, patient tested positive for COVID-19. Evening of 8/13, patient had significantly increased oxygen requirement and in AM of 8/14 was on 25L/min 100% FiO2 HFNC plus nonrebreather. Cortrak in place to supplement nutrition. SLP ordered to assess swallow function due to patient reporting difficulty swallowing at times as well as endorsing a productive cough.      SLP Plan  Continue with current plan of care       Recommendations  Diet recommendations: Dysphagia 2 (fine chop);Thin liquid Liquids provided via: Straw Supervision: Patient able to self feed;Intermittent supervision to cue for compensatory strategies Compensations: Minimize environmental distractions;Slow rate;Small sips/bites Postural Changes and/or Swallow Maneuvers: Seated upright 90 degrees                Oral Care Recommendations: Oral care BID Follow up Recommendations: None SLP Visit Diagnosis: Dysphagia, unspecified (R13.10) Plan: Continue with current plan of care       Ryan [name], Ryan [name], Ryan [name] Office Number: [phone]   Acie Fredrickson 05/07/2021, 3:59 PM

## 2021-05-07 NOTE — Progress Notes (Signed)
Physical Therapy Treatment Patient Details Name: Ryan [name] MRN: [id number] DOB: 1945/11/05 Today's Date: 05/07/2021    History of Present Illness Pt 75 y.o. 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 Scheduled to have debridement on 7/20 but due to an INR of 3.2 surgery was postponed. He eventually underwent partial foot/toe amputation with excisional debridement and wound VAC placement on 7/21, left lower extremity angiogram in 7/25, and R IJ temp HD catheter placement on 8/1.  Pt with L sided hydronephrosis possibly secondary to L retroperitoneal hematoma, s/p nephrostomy tube on 8/1 to drain. Now on airborne/contact precautions, positive Covid-19 test collected 8/9.   Pt scheduled for R LE AKA 8/19.  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    PT Comments    Pt has made slower progress toward goals.  He is limited by congestion/coughing, generalize pain in swollen L arm and legs.  Emphasis today on bed exercise for warm up and strengthening, transitions, sit to stands.  Pt ended up back in the bed.    Follow Up Recommendations  SNF;Supervision for mobility/OOB;Supervision/Assistance - 24 hour     Equipment Recommendations  Wheelchair (measurements PT);Wheelchair cushion (measurements PT)    Recommendations for Other Services       Precautions / Restrictions Precautions Precautions: Fall Precaution Comments: Airborne/Contact precs, L nephrostomy tube/drain, cortrak Other Brace: L nephrostomy tube/drain    Mobility  Bed Mobility Overal bed mobility: Needs Assistance Bed Mobility: Supine to Sit;Sit to Supine     Supine to sit: Mod assist Sit to supine: Mod assist   General bed mobility comments: truncal and LE assist up via L elbow with support while pt used L uE for assist     Transfers Overall transfer level: Needs assistance Equipment used: Rolling walker (2 wheeled);None Transfers: Sit to/from Stand Sit to Stand: Max assist;From elevated surface (x2 at EOB)         General transfer comment: Not transferred to the chair by RN request.  Ambulation/Gait                 Stairs             Wheelchair Mobility    Modified Rankin (Stroke Patients Only)       Balance Overall balance assessment: Needs assistance Sitting-balance support: Feet supported Sitting balance-Leahy Scale: Fair Sitting balance - Comments: Supervision for seated balance   Standing balance support: Bilateral upper extremity supported;During functional activity Standing balance-Leahy Scale: Zero Standing balance comment: max assist in the RW for partical stand, but with face to face assist pt attain upright (sub-maximal) standi.                            Cognition Arousal/Alertness: Awake/alert Behavior During Therapy: Flat affect;WFL for tasks assessed/performed Overall Cognitive Status: No family/caregiver present to determine baseline cognitive functioning                                        Exercises Other Exercises Other Exercises: hip/knee flex/ext ROM with graded assist in flexion and graded resistance in extension Other Exercises: seated LAQ at EOB x 10 bil. Other Exercises: bicep/tricep presses with graded resistance x 10 reps. Other Exercises: reach to  the ceiling x 10 aarom.    General Comments General comments (skin integrity, edema, etc.): VSS overall      Pertinent Vitals/Pain Pain Assessment: Faces Faces Pain Scale: Hurts little more Pain Location: LEs, back Pain Descriptors / Indicators: Grimacing Pain Intervention(s): Monitored during session    Home Living                      Prior Function            PT Goals (current goals can now be found in the care plan section) Acute Rehab PT  Goals PT Goal Formulation: With patient Time For Goal Achievement: 05/20/21 Potential to Achieve Goals: Fair Progress towards PT goals: Progressing toward goals    Frequency    Min 2X/week      PT Plan Current plan remains appropriate    Co-evaluation              AM-PAC PT "6 Clicks" Mobility   Outcome Measure  Help needed turning from your back to your side while in a flat bed without using bedrails?: A Lot Help needed moving from lying on your back to sitting on the side of a flat bed without using bedrails?: A Lot Help needed moving to and from a bed to a chair (including a wheelchair)?: Total Help needed standing up from a chair using your arms (e.g., wheelchair or bedside chair)?: Total Help needed to walk in hospital room?: Total Help needed climbing 3-5 steps with a railing? : Total 6 Click Score: 8    End of Session   Activity Tolerance: Patient tolerated treatment well;Patient limited by fatigue Patient left: in bed;with call bell/phone within reach Nurse Communication: Mobility status PT Visit Diagnosis: Other abnormalities of gait and mobility (R26.89);Muscle weakness (generalized) (M62.81);Pain Pain - Right/Left: Right Pain - part of body: Leg     Time: 1430-1510 PT Time Calculation (min) (ACUTE ONLY): 40 min  Charges:  $Therapeutic Exercise: 8-22 mins $Therapeutic Activity: 23-37 mins                     05/07/2021  Ginger Carne., PT Acute Rehabilitation Services [phone]  (pager) [phone]  (office)   Tessie Fass Darica Goren 05/07/2021, 6:25 PM

## 2021-05-08 ENCOUNTER — Inpatient Hospital Stay (HOSPITAL_COMMUNITY): Payer: Medicare HMO

## 2021-05-08 DIAGNOSIS — I5022 Chronic systolic (congestive) heart failure: Secondary | ICD-10-CM | POA: Diagnosis not present

## 2021-05-08 DIAGNOSIS — I10 Essential (primary) hypertension: Secondary | ICD-10-CM | POA: Diagnosis not present

## 2021-05-08 DIAGNOSIS — Z95810 Presence of automatic (implantable) cardiac defibrillator: Secondary | ICD-10-CM | POA: Diagnosis not present

## 2021-05-08 DIAGNOSIS — I70229 Atherosclerosis of native arteries of extremities with rest pain, unspecified extremity: Secondary | ICD-10-CM | POA: Diagnosis not present

## 2021-05-08 LAB — BASIC METABOLIC PANEL
Anion gap: 6 (ref 5–15)
BUN: 50 mg/dL — ABNORMAL HIGH (ref 8–23)
CO2: 24 mmol/L (ref 22–32)
Calcium: 8.6 mg/dL — ABNORMAL LOW (ref 8.9–10.3)
Chloride: 111 mmol/L (ref 98–111)
Creatinine, Ser: 1.29 mg/dL — ABNORMAL HIGH (ref 0.61–1.24)
GFR, Estimated: 57 mL/min — ABNORMAL LOW (ref >60)
Glucose, Bld: 106 mg/dL — ABNORMAL HIGH (ref 70–99)
Potassium: 4.3 mmol/L (ref 3.5–5.1)
Sodium: 141 mmol/L (ref 135–145)

## 2021-05-08 LAB — CBC
HCT: 33.8 % — ABNORMAL LOW (ref 39.0–52.0)
Hemoglobin: 10.3 g/dL — ABNORMAL LOW (ref 13.0–17.0)
MCH: 25 pg — ABNORMAL LOW (ref 26.0–34.0)
MCHC: 30.5 g/dL (ref 30.0–36.0)
MCV: 82 fL (ref 80.0–100.0)
Platelets: 250 10*3/uL (ref 150–400)
RBC: 4.12 MIL/uL — ABNORMAL LOW (ref 4.22–5.81)
RDW: 23.2 % — ABNORMAL HIGH (ref 11.5–15.5)
WBC: 13.9 10*3/uL — ABNORMAL HIGH (ref 4.0–10.5)
nRBC: 0.1 % (ref 0.0–0.2)

## 2021-05-08 LAB — GLUCOSE, CAPILLARY
Glucose-Capillary: 102 mg/dL — ABNORMAL HIGH (ref 70–99)
Glucose-Capillary: 122 mg/dL — ABNORMAL HIGH (ref 70–99)
Glucose-Capillary: 146 mg/dL — ABNORMAL HIGH (ref 70–99)
Glucose-Capillary: 177 mg/dL — ABNORMAL HIGH (ref 70–99)
Glucose-Capillary: 178 mg/dL — ABNORMAL HIGH (ref 70–99)
Glucose-Capillary: 95 mg/dL (ref 70–99)

## 2021-05-08 LAB — HEPARIN LEVEL (UNFRACTIONATED): Heparin Unfractionated: 0.54 IU/mL (ref 0.30–0.70)

## 2021-05-08 LAB — OCCULT BLOOD X 1 CARD TO LAB, STOOL: Fecal Occult Bld: NEGATIVE

## 2021-05-08 MED ORDER — FUROSEMIDE 10 MG/ML IJ SOLN
40.0000 mg | Freq: Once | INTRAMUSCULAR | Status: AC
  Administered 2021-05-08: 40 mg via INTRAVENOUS
  Filled 2021-05-08: qty 4

## 2021-05-08 NOTE — Progress Notes (Signed)
Surgery cancelled per Dr. [name].  OK to eat.

## 2021-05-08 NOTE — Care Management Important Message (Signed)
Important Message  Patient Details  Name: Ryan [name] MRN: [id number] Date of Birth: [birth year]   Medicare Important Message Given:  Yes     Shelda Altes 05/08/2021, 9:15 AM

## 2021-05-08 NOTE — Progress Notes (Signed)
[place] for IV Heparin Indication:  VTE Treatment  Allergies  Allergen Reactions   Tuna [Fish Allergy] Nausea And Vomiting    Patient Measurements: Height: 6' (182.9 cm) Weight: 84.4 kg (186 lb) IBW/kg (Calculated) : 77.6 Heparin Dosing Weight:  88 kg  Vital Signs: Temp: 97.7 F (36.5 C) (08/19 1200) Temp Source: Oral (08/19 1200) BP: 103/59 (08/19 1200) Pulse Rate: 79 (08/19 1200)  Labs: Recent Labs    05/06/21 0300 05/06/21 1700 05/07/21 0350 05/07/21 1330 05/08/21 0400  HGB 10.0*  --  10.1*  --  10.3*  HCT 32.2*  --  32.9*  --  33.8*  PLT 240  --  247  --  250  HEPARINUNFRC 0.28*   < > 0.40 0.32 0.54  CREATININE 1.57*  --   --   --  1.29*   < > = values in this interval not displayed.    Estimated Creatinine Clearance: 52.6 mL/min (A) (by C-G formula based on SCr of 1.29 mg/dL (H)).  Assessment: 75 yr old man admitted on 04/08/21 with critical limb ischemic of RLE.  Pt with permanent atrial fibrillation, also with mitral valve replacement with bioprosthetic valve (on warfarin PTA for afib, on hold). Also recent dx of COVID infection.    DVT prophylaxis had been on hold due to recent retroperitoneal hematoma, for which he was given vit K and FFP (repeat CT on 8/5 remained unchanged). Now with acute PE and bilateral DVTs, so pharmacy was consulted for IV heparin without boluses.  No bleeding noted per chart review. Heparin gtt increased slightly on 8/18 to 1700 units/hr due to downtrending heparin levels. Heparin levels remain therapeutic.   Goal of Therapy:  Heparin 0.3-0.5 units/ml (lower end of goal range due to recent hematoma) Monitor platelets by anticoagulation protocol: Yes   Plan:  - Continue heparin gtt @ 1700 units/hr - Daily heparin level and CBC - VVS planning for R AKA on 8/19 now to be rescheduled   Thank you for allowing pharmacy to be a part of this patient's care.  Ardyth Harps, PharmD Clinical  Pharmacist

## 2021-05-08 NOTE — Progress Notes (Signed)
SLP Cancellation Note  Patient Details Name: Ryan [name] MRN: [id number] DOB: 03-09-1946   Cancelled treatment:       Reason Eval/Treat Not Completed: Medical issues which prohibited therapy. Pt NPO today with plans for R AKA. Will f/u as able.    Osie Bond., M.A. [place] [facility] Pager [phone] Office [phone]  05/08/2021, 7:19 AM

## 2021-05-08 NOTE — Progress Notes (Signed)
PROGRESS NOTE    Ryan [name]  [id number] DOB: 1946-05-19 DOA: 04/08/2021 PCP: [name], MD   Brief Narrative: Ryan [name] is a 75 y.o. male with a history of atrial fibrillation and mitral valve replacement on Coumadin, chronic systolic heart failure status post AICD, primary hypertension, peripheral vascular disease.  Patient presented secondary to right foot wound secondary to critical limb ischemia.  Vascular surgery was consulted.  Patient underwent partial right foot amputation with excisional debridement and wound VAC placement.  Patient continued to have worsening evidence of limb ischemia with vascular surgery now recommending above knee amputation.  During admission, patient developed significant AKI in setting of left hydronephrosis and likely ATN from hypotension.  Patient required hemodialysis transiently but this has been discontinued.   Assessment & Plan:   Principal Problem:   Critical lower limb ischemia (HCC) Active Problems:   Chronic systolic heart failure (HCC)   Status post mitral valve replacement with bioprosthetic valve   Diabetes mellitus ([place])   Essential hypertension   ICD (implantable cardioverter-defibrillator) in place   Stage 3 chronic kidney disease (HCC)   Pressure injury of skin   Acute respiratory failure with hypoxia Secondary to multiple issues including acute PE, pneumonia.  Patient required significantly elevated oxygen delivery up to 25 L/min on heated high flow nasal cannula and is now been weaned to about 2 L/min. -Wean oxygen to room air as able -Lasix 40 mg IV x1  Acute pulmonary embolism Acute LUE DVT In setting of COVID infection, discontinuation of Coumadin secondary to retroperitoneal hematoma/intramuscular hemorrhage in addition to prolonged hospitalization.  Diagnosed on 8/16 via perfusion scan which was consistent with right upper and right lower lobe PE.  Heparin IV restarted after discussion with vascular  surgery. -Continue heparin IV  Possible thrush May be contributing to pain with swallowing.  Does not appear to have esophageal symptoms. Symptoms improving. -Nystatin solution QID  Possible aspiration Initial concern for cause of hypoxia prior to diagnosis of PE.  With diagnosis of PE in addition to multifocal pneumonia from COVID-19, does not appear that aspiration pneumonia is likely.  Patient was treated empirically with Zosyn and vancomycin which have been discontinued.  Speech therapy was consulted and recommend regular diet with thin liquids.  COVID-19 pneumonia Multifocal pneumonia Resolved. Patient treated with a complete 5-day course remdesivir.  Currently on Decadron secondary to associated hypoxia. -Continue Decadron to complete 10-day course -Tessalon perles for cough  Poor oral intake CorTrak placed. Dietitian consulted. Patient appears to have difficulty with eating secondary to pain with swallowing. Currently on tube feeds. Clear for diet from a speech therapy standpoint. -Continue tube feeds with plan to wean off and continue oral intake  Leukocytosis In setting of infection and steroid use.  Trending down.  Right critical lower limb ischemia Vascular surgery on board.  Plan is now for right AKA on 8/19.  Retroperitoneal hematoma Acute blood loss anemia CT scan of abdomen pelvis on 7/30 was significant for a large  (up to 17 cm) retroperitoneal bleed with associated left iliac [place] muscle intramuscular hemorrhage.  In setting of Coumadin use as an outpatient.  At that time, he was given vitamin K and FFP for therapeutic INR.  Repeat CT abdomen pelvis on 8/5 significant for unchanged hematoma.  Patient now restarted on anticoagulation for acute DVT and PEs. -Trend hemoglobin with daily CBC  AKI on CKD stage IIIb Likely from ATN from hypotension/retroperitoneal bleed with resultant displacement of left kidney with resultant hydronephrosis.  Patient is status post left  percutaneous nephrostomy tube placement on 8/1.  Peak creatinine of 5.63.  Nephrology was consulted and patient was started on HD transiently with significant provement of renal function.  AKI now currently resolved and creatinine back to baseline.  Temporary dialysis catheter removed.   Hydronephrosis As mentioned above.  Percutaneous nephrostomy tube placed on 8/1 with recommendation to keep in place for at least 2 months in addition to outpatient urology follow-up.  Chronic systolic heart failure Stable.  Permanent atrial fibrillation Status post mitral valve replacement with bioprosthetic valve Currently on heparin for anticoagulation.  Also on amiodarone. -Continue amiodarone and heparin IV  Diabetes mellitus type 2 Well-controlled with hemoglobin A1c of 5.4%.  Primary pretension He has been borderline hypotensive this admission.  No antihypertensives.  Hyperlipidemia -Continue Lipitor  Pressure injury Medial coccyx, unknown if present on admission.   DVT prophylaxis: Heparin IV Code Status:   Code Status: Full Code Family Communication: None at bedside. Called son per patient request, but no answer. Disposition Plan: Discharge likely to SNF in several days pending vascular surgery recommendations, stability of hemoglobin, transition to oral medications including anticoagulation, removal of NG tube and improvement of oral intake   Consultants:  Vascular surgery  Procedures:    Antimicrobials: Ceftriaxone IV Metronidazole IV Remdesivir IV Vancomycin IV   Subjective: Coughing. Some dyspnea with the cough. No chest pain  Objective: Vitals:   05/07/21 2336 05/08/21 0450 05/08/21 0653 05/08/21 1200  BP: (!) 115/51   (!) 103/59  Pulse: 66   79  Resp: 18   18  Temp: 98 F (36.7 C)   97.7 F (36.5 C)  TempSrc: Oral   Oral  SpO2: 96%  95% 94%  Weight:  84.4 kg    Height:        Intake/Output Summary (Last 24 hours) at 05/08/2021 1358 Last data filed at  05/08/2021 1213 Gross per 24 hour  Intake 60 ml  Output 151 ml  Net -91 ml    Filed Weights   05/06/21 0500 05/07/21 0403 05/08/21 0450  Weight: 84.4 kg 84.4 kg 84.4 kg    Examination:  General exam: Appears calm and comfortable and in no acute distress. Conversant Respiratory: Diffuse rhonchi. Respiratory effort normal with no intercostal retractions or use of accessory muscles Cardiovascular: S1 & S2 heard, RRR. No murmurs, rubs, gallops or clicks. 2+ LUE pitting edema Gastrointestinal: Abdomen is nondistended, soft and nontender. No masses felt. Normal bowel sounds heard Neurologic: No focal neurological deficits Musculoskeletal: No calf tenderness Skin: No cyanosis. No new rashes Psychiatry: Alert and oriented. Memory intact. Mood & affect appropriate   Data Reviewed: I have personally reviewed following labs and imaging studies  CBC Lab Results  Component Value Date   WBC 13.9 (H) 05/08/2021   RBC 4.12 (L) 05/08/2021   HGB 10.3 (L) 05/08/2021   HCT 33.8 (L) 05/08/2021   MCV 82.0 05/08/2021   MCH 25.0 (L) 05/08/2021   PLT 250 05/08/2021   MCHC 30.5 05/08/2021   RDW 23.2 (H) 05/08/2021   LYMPHSABS 1,363 06/01/2016   MONOABS 517 06/01/2016   EOSABS 141 06/01/2016   BASOSABS 47 [id number]     Last metabolic panel Lab Results  Component Value Date   NA 141 05/08/2021   K 4.3 05/08/2021   CL 111 05/08/2021   CO2 24 05/08/2021   BUN 50 (H) 05/08/2021   CREATININE 1.29 (H) 05/08/2021   GLUCOSE 106 (H) 05/08/2021   GFRNONAA 57 (L) 05/08/2021  GFRAA 39 (L) 10/01/2020   CALCIUM 8.6 (L) 05/08/2021   PHOS 3.3 04/30/2021   PROT 5.8 (L) 05/04/2021   ALBUMIN 1.9 (L) 05/04/2021   LABGLOB 2.2 03/09/2021   BILITOT 1.1 05/04/2021   ALKPHOS 89 05/04/2021   AST 41 05/04/2021   ALT 29 05/04/2021   ANIONGAP 6 05/08/2021    CBG (last 3)  Recent Labs    05/08/21 0403 05/08/21 0812 05/08/21 1200  GLUCAP 102* 95 122*      GFR: Estimated Creatinine Clearance:  52.6 mL/min (A) (by C-G formula based on SCr of 1.29 mg/dL (H)).  Coagulation Profile: No results for input(s): INR, PROTIME in the last 168 hours.   Recent Results (from the past 240 hour(s))  Resp Panel by RT-PCR (Flu A&B, Covid) Nasopharyngeal Swab     Status: Abnormal   Collection Time: 04/28/21  5:44 PM   Specimen: Nasopharyngeal Swab; Nasopharyngeal(NP) swabs in vial transport medium  Result Value Ref Range Status   SARS Coronavirus 2 by RT PCR POSITIVE (A) NEGATIVE Final    Comment: RESULT CALLED TO, READ BACK BY AND VERIFIED WITHDonetta Potts RN [id number] 04/28/21 A BROWNING (NOTE) SARS-CoV-2 target nucleic acids are DETECTED.  The SARS-CoV-2 RNA is generally detectable in upper respiratory specimens during the acute phase of infection. Positive results are indicative of the presence of the identified virus, but do not rule out bacterial infection or co-infection with other pathogens not detected by the test. Clinical correlation with patient history and other diagnostic information is necessary to determine patient infection status. The expected result is Negative.  Fact Sheet for Patients: EntrepreneurPulse.com.au  Fact Sheet for Healthcare Providers: IncredibleEmployment.be  This test is not yet approved or cleared by the [place] FDA and  has been authorized for detection and/or diagnosis of SARS-CoV-2 by FDA under an Emergency Use Authorization (EUA).  This EUA will remain in effect (meaning this test can  be used) for the duration of  the COVID-19 declaration under Section 564(b)(1) of the Act, 21 U.S.C. section 360bbb-3(b)(1), unless the authorization is terminated or revoked sooner.     Influenza A by PCR NEGATIVE NEGATIVE Final   Influenza B by PCR NEGATIVE NEGATIVE Final    Comment: (NOTE) The Xpert Xpress SARS-CoV-2/FLU/RSV plus assay is intended as an aid in the diagnosis of influenza from Nasopharyngeal swab specimens  and should not be used as a sole basis for treatment. Nasal washings and aspirates are unacceptable for Xpert Xpress SARS-CoV-2/FLU/RSV testing.  Fact Sheet for Patients: EntrepreneurPulse.com.au  Fact Sheet for Healthcare Providers: IncredibleEmployment.be  This test is not yet approved or cleared by the [place] FDA and has been authorized for detection and/or diagnosis of SARS-CoV-2 by FDA under an Emergency Use Authorization (EUA). This EUA will remain in effect (meaning this test can be used) for the duration of the COVID-19 declaration under Section 564(b)(1) of the Act, 21 U.S.C. section 360bbb-3(b)(1), unless the authorization is terminated or revoked.  Performed at [facility], [place] [address], [place], [place] [address]   MRSA Next Gen by PCR, Nasal     Status: Abnormal   Collection Time: 05/03/21 12:18 AM   Specimen: Nasal Mucosa; Nasal Swab  Result Value Ref Range Status   MRSA by PCR Next Gen POSITIVE (A) NOT DETECTED Final    Comment: CRITICAL RESULT CALLED TO, READ BACK BY AND VERIFIED WITH: RN Corie Chiquito [id number]'$  THANEY Performed at [facility], [place] [address], [place], [place] [address]  Radiology Studies: No results found.      Scheduled Meds:  amiodarone  200 mg Oral Daily   vitamin C  500 mg Oral Daily   atorvastatin  40 mg Oral q1800   benzonatate  100 mg Oral TID   Chlorhexidine Gluconate Cloth  6 each Topical Daily   dexamethasone  6 mg Oral Daily   dextromethorphan-guaiFENesin  1 tablet Oral BID   docusate sodium  100 mg Oral BID   feeding supplement  237 mL Oral BID BM   feeding supplement (OSMOLITE 1.5 CAL)  1,000 mL Per Tube Q24H   feeding supplement (PROSource TF)  45 mL Per Tube TID   insulin aspart  0-15 Units Subcutaneous TID WC   midodrine  10 mg Oral TID WC   multivitamin with minerals  1 tablet Oral Daily   nystatin  5 mL Oral QID   pantoprazole  40 mg Oral  Daily   sodium chloride flush  10-40 mL Intracatheter Q12H   sodium chloride flush  3 mL Intravenous Q12H   zinc sulfate  220 mg Oral Daily   Continuous Infusions:  cefUROXime (ZINACEF)  IV     heparin 1,700 Units/hr (05/08/21 1347)     LOS: 30 days     Cordelia Poche, MD [facility] 05/08/2021, 1:58 PM  If 7PM-7AM, please contact night-coverage [email or web address]

## 2021-05-08 NOTE — Progress Notes (Signed)
Have to cancel surgery for today to accommodate an emergency coming from the office. Will reschedule him for Monday. OK for diet.   Ryan [name]. [name], MD [facility] Phone Number: [phone] 05/08/2021 2:32 PM

## 2021-05-09 DIAGNOSIS — I5022 Chronic systolic (congestive) heart failure: Secondary | ICD-10-CM | POA: Diagnosis not present

## 2021-05-09 DIAGNOSIS — I70229 Atherosclerosis of native arteries of extremities with rest pain, unspecified extremity: Secondary | ICD-10-CM | POA: Diagnosis not present

## 2021-05-09 DIAGNOSIS — Z95810 Presence of automatic (implantable) cardiac defibrillator: Secondary | ICD-10-CM | POA: Diagnosis not present

## 2021-05-09 DIAGNOSIS — I10 Essential (primary) hypertension: Secondary | ICD-10-CM | POA: Diagnosis not present

## 2021-05-09 LAB — CBC
HCT: 32.4 % — ABNORMAL LOW (ref 39.0–52.0)
Hemoglobin: 9.9 g/dL — ABNORMAL LOW (ref 13.0–17.0)
MCH: 24.8 pg — ABNORMAL LOW (ref 26.0–34.0)
MCHC: 30.6 g/dL (ref 30.0–36.0)
MCV: 81 fL (ref 80.0–100.0)
Platelets: 218 10*3/uL (ref 150–400)
RBC: 4 MIL/uL — ABNORMAL LOW (ref 4.22–5.81)
RDW: 23.8 % — ABNORMAL HIGH (ref 11.5–15.5)
WBC: 12.9 10*3/uL — ABNORMAL HIGH (ref 4.0–10.5)
nRBC: 0.2 % (ref 0.0–0.2)

## 2021-05-09 LAB — GLUCOSE, CAPILLARY
Glucose-Capillary: 131 mg/dL — ABNORMAL HIGH (ref 70–99)
Glucose-Capillary: 136 mg/dL — ABNORMAL HIGH (ref 70–99)
Glucose-Capillary: 141 mg/dL — ABNORMAL HIGH (ref 70–99)
Glucose-Capillary: 154 mg/dL — ABNORMAL HIGH (ref 70–99)
Glucose-Capillary: 158 mg/dL — ABNORMAL HIGH (ref 70–99)
Glucose-Capillary: 159 mg/dL — ABNORMAL HIGH (ref 70–99)
Glucose-Capillary: 165 mg/dL — ABNORMAL HIGH (ref 70–99)

## 2021-05-09 LAB — HEPARIN LEVEL (UNFRACTIONATED): Heparin Unfractionated: 0.27 IU/mL — ABNORMAL LOW (ref 0.30–0.70)

## 2021-05-09 MED ORDER — INSULIN ASPART 100 UNIT/ML IJ SOLN
0.0000 [IU] | INTRAMUSCULAR | Status: DC
Start: 2021-05-09 — End: 2021-05-23
  Administered 2021-05-09 (×3): 3 [IU] via SUBCUTANEOUS
  Administered 2021-05-09: 2 [IU] via SUBCUTANEOUS
  Administered 2021-05-10: 3 [IU] via SUBCUTANEOUS
  Administered 2021-05-10 – 2021-05-13 (×4): 2 [IU] via SUBCUTANEOUS
  Administered 2021-05-13: 3 [IU] via SUBCUTANEOUS
  Administered 2021-05-13 – 2021-05-14 (×2): 2 [IU] via SUBCUTANEOUS

## 2021-05-09 NOTE — Consult Note (Addendum)
[employer] Nurse Consult Note: Reason for Consult: Stage 2 pressure injury noted to coccyx. Wound type:Pressure Pressure Injury POA: No Measurement: 2cm x 2cm x 0.1cm (per Nurses Note on 05/06/21 Wound [id number], granulating Drainage (amount, consistency, odor) none Periwound: intact Dressing procedure/placement/frequency:I have provided Nursing with guidance for the topical care of this lesions using a size appropriate piece of xeroform gauze topped with dry gauze and secured with our house silicone foam dressing placed with the "tip" oriented away from the anus for maximum coverage of the coccyx. Patient is to be turned from side to side and time in the supine position minimized. He is also ordered a pressure redistribution chair pad for his use while OOB in the chair today.  [place] nursing team will follow, seeing weekly to every 10 days and will remain available to this patient, the nursing and medical teams.  Please re-consult if needed between visits Thanks, Maudie Flakes, MSN, RN, Chancy Milroy, Arther Abbott  Pager# [phone]

## 2021-05-09 NOTE — Progress Notes (Signed)
ANTICOAGULATION CONSULT NOTE - Follow Up Consult  Pharmacy Consult for IV heparin Indication: VTE treatment  Allergies  Allergen Reactions   Tuna [Fish Allergy] Nausea And Vomiting    Patient Measurements: Height: 6' (182.9 cm) Weight: 84.5 kg (186 lb 4.6 oz) IBW/kg (Calculated) : 77.6 Heparin Dosing Weight: 88 kg  Vital Signs: Temp: 97.7 F (36.5 C) (08/20 0754) Temp Source: Oral (08/20 0754) BP: 111/93 (08/20 0754) Pulse Rate: 71 (08/20 0754)  Labs: Recent Labs    05/07/21 0350 05/07/21 1330 05/08/21 0400 05/09/21 0842  HGB 10.1*  --  10.3* 9.9*  HCT 32.9*  --  33.8* 32.4*  PLT 247  --  250 218  HEPARINUNFRC 0.40 0.32 0.54 0.27*  CREATININE  --   --  1.29*  --     Estimated Creatinine Clearance: 52.6 mL/min (A) (by C-G formula based on SCr of 1.29 mg/dL (H)).  Assessment: 75 yr old man admitted on 04/08/21 with critical limb ischemic of RLE.  Pt with permanent atrial fibrillation, also with mitral valve replacement with bioprosthetic valve (on warfarin PTA for afib, on hold). Also recent dx of COVID infection. DVT prophylaxis had been on hold due to recent retroperitoneal hematoma, for which he was given vit K and FFP (repeat CT on 8/5 remained unchanged). Now with acute PE and bilateral DVTs, so pharmacy was consulted for IV heparin without boluses.   No bleeding noted per chart review. Heparin level this morning at 0.27, slightly below goal and therefore will increase heparin infusion rate.   Goal of Therapy:  Heparin 0.3-0.5 units/ml (lower end of goal range due to recent hematoma) Monitor platelets by anticoagulation protocol: Yes  Plan:  Increase heparin gtt to 1800 units/hr  Check heparin level in 6h Daily heparin level and CBC Monitor for s/sx of bleeding  Thank you for including pharmacy in the care of this patient.  Zenaida Deed, PharmD PGY1 Acute Care Pharmacy Resident  Phone: [phone] 05/09/2021  10:55 AM  Please check [email or web address] for  unit-specific pharmacy phone numbers.

## 2021-05-09 NOTE — Progress Notes (Signed)
PROGRESS NOTE    [name]  [id number] DOB: 04/19/1946 DOA: 04/08/2021 PCP: [name], MD   Brief Narrative: Ryan [name] is a 75 y.o. male with a history of atrial fibrillation and mitral valve replacement on Coumadin, chronic systolic heart failure status post AICD, primary hypertension, peripheral vascular disease.  Patient presented secondary to right foot wound secondary to critical limb ischemia.  Vascular surgery was consulted.  Patient underwent partial right foot amputation with excisional debridement and wound VAC placement.  Patient continued to have worsening evidence of limb ischemia with vascular surgery now recommending above knee amputation.  During admission, patient developed significant AKI in setting of left hydronephrosis and likely ATN from hypotension.  Patient required hemodialysis transiently but this has been discontinued.   Assessment & Plan:   Principal Problem:   Critical lower limb ischemia (HCC) Active Problems:   Chronic systolic heart failure (HCC)   Status post mitral valve replacement with bioprosthetic valve   Diabetes mellitus ([place])   Essential hypertension   ICD (implantable cardioverter-defibrillator) in place   Stage 3 chronic kidney disease (HCC)   Pressure injury of skin   Acute respiratory failure with hypoxia Secondary to multiple issues including acute PE, pneumonia.  Patient required significantly elevated oxygen delivery up to 25 L/min on heated high flow nasal cannula and is now been weaned to about 2 L/min. Breathing better after Lasix IV x1 on 8/19 -Wean oxygen to room air as able  Acute pulmonary embolism Acute LUE DVT In setting of COVID infection, discontinuation of Coumadin secondary to retroperitoneal hematoma/intramuscular hemorrhage in addition to prolonged hospitalization.  Diagnosed on 8/16 via perfusion scan which was consistent with right upper and right lower lobe PE.  Heparin IV restarted after discussion  with vascular surgery. -Continue heparin IV  Possible thrush May be contributing to pain with swallowing.  Does not appear to have esophageal symptoms. Symptoms improving. -Nystatin solution QID  Possible aspiration Initial concern for cause of hypoxia prior to diagnosis of PE.  With diagnosis of PE in addition to multifocal pneumonia from COVID-19, does not appear that aspiration pneumonia is likely.  Patient was treated empirically with Zosyn and vancomycin which have been discontinued.  Speech therapy was consulted and recommend regular diet with thin liquids.  COVID-19 pneumonia Multifocal pneumonia Resolved. Patient treated with a complete 5-day course remdesivir.  Currently on Decadron secondary to associated hypoxia. -Continue Decadron to complete 10-day course -Tessalon perles for cough  Poor oral intake CorTrak placed. Dietitian consulted. Patient appears to have difficulty with eating secondary to pain with swallowing. Currently on tube feeds. Clear for diet from a speech therapy standpoint. -Continue tube feeds with plan to wean off and continue oral intake  Leukocytosis In setting of infection and steroid use.  Trending down.  Right critical lower limb ischemia Vascular surgery on board.  Plan is now for right AKA on 8/22; canceled on 8/19 secondary to emergent case.  Retroperitoneal hematoma Acute blood loss anemia CT scan of abdomen pelvis on 7/30 was significant for a large  (up to 17 cm) retroperitoneal bleed with associated left iliac [place] muscle intramuscular hemorrhage.  In setting of Coumadin use as an outpatient.  At that time, he was given vitamin K and FFP for therapeutic INR.  Repeat CT abdomen pelvis on 8/5 significant for unchanged hematoma.  Patient now restarted on anticoagulation for acute DVT and PEs. -Trend hemoglobin with daily CBC  AKI on CKD stage IIIb Likely from ATN from hypotension/retroperitoneal  bleed with resultant displacement of left kidney with  resultant hydronephrosis.  Patient is status post left percutaneous nephrostomy tube placement on 8/1.  Peak creatinine of 5.63.  Nephrology was consulted and patient was started on HD transiently with significant provement of renal function.  AKI now currently resolved and creatinine back to baseline.  Temporary dialysis catheter removed.   Hydronephrosis As mentioned above.  Percutaneous nephrostomy tube placed on 8/1 with recommendation to keep in place for at least 2 months in addition to outpatient urology follow-up.  Chronic systolic heart failure Stable.  Permanent atrial fibrillation Status post mitral valve replacement with bioprosthetic valve Currently on heparin for anticoagulation.  Also on amiodarone. -Continue amiodarone and heparin IV  Diabetes mellitus type 2 Well-controlled with hemoglobin A1c of 5.4%.  Primary pretension He has been borderline hypotensive this admission.  No antihypertensives.  Hyperlipidemia -Continue Lipitor  Pressure injury Medial coccyx, unknown if present on admission.   DVT prophylaxis: Heparin IV Code Status:   Code Status: Full Code Family Communication: None at bedside. Disposition Plan: Discharge likely to SNF in several days pending vascular surgery recommendations, stability of hemoglobin, transition to oral medications including anticoagulation, removal of NG tube and improvement of oral intake   Consultants:  Vascular surgery  Procedures:    Antimicrobials: Ceftriaxone IV Metronidazole IV Remdesivir IV Vancomycin IV   Subjective: Breathing much better. Cough seems to be improved.  Objective: Vitals:   05/08/21 2115 05/09/21 0025 05/09/21 0425 05/09/21 0754  BP: 92/80 (!) 118/51 (!) 146/103 (!) 111/93  Pulse: 72 77 74 71  Resp: '18 19 18   '$ Temp: 98 F (36.7 C) 97.9 F (36.6 C) 98 F (36.7 C) 97.7 F (36.5 C)  TempSrc: Oral Oral Oral Oral  SpO2: 92% 90% 92% 94%  Weight:   84.5 kg   Height:         Intake/Output Summary (Last 24 hours) at 05/09/2021 1009 Last data filed at 05/09/2021 0754 Gross per 24 hour  Intake 2317.96 ml  Output 2177 ml  Net 140.96 ml    Filed Weights   05/07/21 0403 05/08/21 0450 05/09/21 0425  Weight: 84.4 kg 84.4 kg 84.5 kg    Examination:  General exam: Appears calm and comfortable Respiratory system: Diminished breath sounds but seem clear. Respiratory effort normal. Cardiovascular system: S1 & S2 heard, RRR. No murmurs, rubs, gallops or clicks. Gastrointestinal system: Abdomen is nondistended, soft and nontender. No organomegaly or masses felt. Normal bowel sounds heard. Central nervous system: Alert. No focal neurological deficits. Musculoskeletal: Mild-moderate LUE edema. No calf tenderness Skin: No cyanosis. No rashes Psychiatry: Judgement and insight appear normal. Mood & affect appropriate.    Data Reviewed: I have personally reviewed following labs and imaging studies  CBC Lab Results  Component Value Date   WBC 12.9 (H) 05/09/2021   RBC 4.00 (L) 05/09/2021   HGB 9.9 (L) 05/09/2021   HCT 32.4 (L) 05/09/2021   MCV 81.0 05/09/2021   MCH 24.8 (L) 05/09/2021   PLT 218 05/09/2021   MCHC 30.6 05/09/2021   RDW 23.8 (H) 05/09/2021   LYMPHSABS 1,363 06/01/2016   MONOABS 517 06/01/2016   EOSABS 141 06/01/2016   BASOSABS 47 [id number]     Last metabolic panel Lab Results  Component Value Date   NA 141 05/08/2021   K 4.3 05/08/2021   CL 111 05/08/2021   CO2 24 05/08/2021   BUN 50 (H) 05/08/2021   CREATININE 1.29 (H) 05/08/2021   GLUCOSE 106 (H) 05/08/2021  GFRNONAA 57 (L) 05/08/2021   GFRAA 39 (L) 10/01/2020   CALCIUM 8.6 (L) 05/08/2021   PHOS 3.3 04/30/2021   PROT 5.8 (L) 05/04/2021   ALBUMIN 1.9 (L) 05/04/2021   LABGLOB 2.2 03/09/2021   BILITOT 1.1 05/04/2021   ALKPHOS 89 05/04/2021   AST 41 05/04/2021   ALT 29 05/04/2021   ANIONGAP 6 05/08/2021    CBG (last 3)  Recent Labs    05/09/21 0020 05/09/21 0422  05/09/21 0815  GLUCAP 154* 136* 158*      GFR: Estimated Creatinine Clearance: 52.6 mL/min (A) (by C-G formula based on SCr of 1.29 mg/dL (H)).  Coagulation Profile: No results for input(s): INR, PROTIME in the last 168 hours.   Recent Results (from the past 240 hour(s))  MRSA Next Gen by PCR, Nasal     Status: Abnormal   Collection Time: 05/03/21 12:18 AM   Specimen: Nasal Mucosa; Nasal Swab  Result Value Ref Range Status   MRSA by PCR Next Gen POSITIVE (A) NOT DETECTED Final    Comment: CRITICAL RESULT CALLED TO, READ BACK BY AND VERIFIED WITH: RN Corie Chiquito [phone] '@[id number]'$  THANEY Performed at [facility], [place] [address], [place], [place] [address]          Radiology Studies: DG CHEST PORT 1 VIEW  Result Date: 05/08/2021 CLINICAL DATA:  COVID positive.  History of respiratory failure EXAM: PORTABLE CHEST 1 VIEW COMPARISON:  05/04/2021 FINDINGS: NG 2 and PICC line unchanged. Feeding tube and PICC line unchanged. Pacer alive stable cardiac silhouette. There is again demonstrated LEFT basilar atelectasis. Bilateral small effusions IMPRESSION: 1. No unchanged. 2. Stable support apparatus. 3. Bibasilar atelectasis and small effusions. Electronically Signed   By: Suzy Bouchard M.D.   On: 05/08/2021 14:15        Scheduled Meds:  amiodarone  200 mg Oral Daily   vitamin C  500 mg Oral Daily   atorvastatin  40 mg Oral q1800   benzonatate  100 mg Oral TID   Chlorhexidine Gluconate Cloth  6 each Topical Daily   dexamethasone  6 mg Oral Daily   dextromethorphan-guaiFENesin  1 tablet Oral BID   docusate sodium  100 mg Oral BID   feeding supplement  237 mL Oral BID BM   feeding supplement (OSMOLITE 1.5 CAL)  1,000 mL Per Tube Q24H   feeding supplement (PROSource TF)  45 mL Per Tube TID   insulin aspart  0-15 Units Subcutaneous Q4H   midodrine  10 mg Oral TID WC   multivitamin with minerals  1 tablet Oral Daily   nystatin  5 mL Oral QID   pantoprazole  40 mg Oral Daily    sodium chloride flush  10-40 mL Intracatheter Q12H   sodium chloride flush  3 mL Intravenous Q12H   zinc sulfate  220 mg Oral Daily   Continuous Infusions:  cefUROXime (ZINACEF)  IV     heparin 1,700 Units/hr (05/09/21 0824)     LOS: 31 days     Cordelia Poche, MD [facility] 05/09/2021, 10:09 AM  If 7PM-7AM, please contact night-coverage [email or web address]

## 2021-05-10 DIAGNOSIS — I5022 Chronic systolic (congestive) heart failure: Secondary | ICD-10-CM | POA: Diagnosis not present

## 2021-05-10 DIAGNOSIS — Z95810 Presence of automatic (implantable) cardiac defibrillator: Secondary | ICD-10-CM | POA: Diagnosis not present

## 2021-05-10 DIAGNOSIS — I10 Essential (primary) hypertension: Secondary | ICD-10-CM | POA: Diagnosis not present

## 2021-05-10 DIAGNOSIS — I70229 Atherosclerosis of native arteries of extremities with rest pain, unspecified extremity: Secondary | ICD-10-CM | POA: Diagnosis not present

## 2021-05-10 LAB — GLUCOSE, CAPILLARY
Glucose-Capillary: 177 mg/dL — ABNORMAL HIGH (ref 70–99)
Glucose-Capillary: 155 mg/dL — ABNORMAL HIGH (ref 70–99)
Glucose-Capillary: 140 mg/dL — ABNORMAL HIGH (ref 70–99)
Glucose-Capillary: 130 mg/dL — ABNORMAL HIGH (ref 70–99)
Glucose-Capillary: 121 mg/dL — ABNORMAL HIGH (ref 70–99)

## 2021-05-10 LAB — HEPARIN LEVEL (UNFRACTIONATED): Heparin Unfractionated: 0.49 IU/mL (ref 0.30–0.70)

## 2021-05-10 LAB — CBC
HCT: 32.2 % — ABNORMAL LOW (ref 39.0–52.0)
Hemoglobin: 9.6 g/dL — ABNORMAL LOW (ref 13.0–17.0)
MCH: 24.4 pg — ABNORMAL LOW (ref 26.0–34.0)
MCHC: 29.8 g/dL — ABNORMAL LOW (ref 30.0–36.0)
MCV: 81.7 fL (ref 80.0–100.0)
Platelets: 217 10*3/uL (ref 150–400)
RBC: 3.94 MIL/uL — ABNORMAL LOW (ref 4.22–5.81)
RDW: 24.1 % — ABNORMAL HIGH (ref 11.5–15.5)
WBC: 11.7 10*3/uL — ABNORMAL HIGH (ref 4.0–10.5)
nRBC: 0.3 % — ABNORMAL HIGH (ref 0.0–0.2)

## 2021-05-10 LAB — BASIC METABOLIC PANEL
Anion gap: 6 (ref 5–15)
BUN: 45 mg/dL — ABNORMAL HIGH (ref 8–23)
CO2: 24 mmol/L (ref 22–32)
Calcium: 8.7 mg/dL — ABNORMAL LOW (ref 8.9–10.3)
Chloride: 111 mmol/L (ref 98–111)
Creatinine, Ser: 1.13 mg/dL (ref 0.61–1.24)
GFR, Estimated: >60 mL/min (ref >60)
Glucose, Bld: 166 mg/dL — ABNORMAL HIGH (ref 70–99)
Potassium: 4.6 mmol/L (ref 3.5–5.1)
Sodium: 141 mmol/L (ref 135–145)

## 2021-05-10 NOTE — Progress Notes (Signed)
PROGRESS NOTE    [name]  [id number] DOB: Oct 14, 1945 DOA: 04/08/2021 PCP: [name], MD   Brief Narrative: Ryan [name] is a 75 y.o. male with a history of atrial fibrillation and mitral valve replacement on Coumadin, chronic systolic heart failure status post AICD, primary hypertension, peripheral vascular disease.  Patient presented secondary to right foot wound secondary to critical limb ischemia.  Vascular surgery was consulted.  Patient underwent partial right foot amputation with excisional debridement and wound VAC placement.  Patient continued to have worsening evidence of limb ischemia with vascular surgery now recommending above knee amputation.  During admission, patient developed significant AKI in setting of left hydronephrosis and likely ATN from hypotension.  Patient required hemodialysis transiently but this has been discontinued.   Assessment & Plan:   Principal Problem:   Critical lower limb ischemia (HCC) Active Problems:   Chronic systolic heart failure (HCC)   Status post mitral valve replacement with bioprosthetic valve   Diabetes mellitus ([place])   Essential hypertension   ICD (implantable cardioverter-defibrillator) in place   Stage 3 chronic kidney disease (HCC)   Pressure injury of skin   Acute respiratory failure with hypoxia Secondary to multiple issues including acute PE, pneumonia.  Patient required significantly elevated oxygen delivery up to 25 L/min on heated high flow nasal cannula and is now been weaned to about 2 L/min. Breathing better after Lasix IV x1 on 8/19 -Wean oxygen to room air as able  Acute pulmonary embolism Acute LUE DVT In setting of COVID infection, discontinuation of Coumadin secondary to retroperitoneal hematoma/intramuscular hemorrhage in addition to prolonged hospitalization.  Diagnosed on 8/16 via perfusion scan which was consistent with right upper and right lower lobe PE.  Heparin IV restarted after discussion  with vascular surgery. -Continue heparin IV  Possible thrush May be contributing to pain with swallowing.  Does not appear to have esophageal symptoms. Symptoms improving. -Nystatin solution QID x7 days at least based on response  Possible aspiration Initial concern for cause of hypoxia prior to diagnosis of PE.  With diagnosis of PE in addition to multifocal pneumonia from COVID-19, does not appear that aspiration pneumonia is likely.  Patient was treated empirically with Zosyn and vancomycin which have been discontinued.  Speech therapy was consulted and recommend regular diet with thin liquids.  COVID-19 pneumonia Multifocal pneumonia Resolved. Patient treated with a complete 5-day course remdesivir.  Currently on Decadron secondary to associated hypoxia. -Continue Decadron to complete 10-day course -Tessalon perles for cough  Poor oral intake CorTrak placed. Dietitian consulted. Patient appears to have difficulty with eating secondary to pain with swallowing. Currently on tube feeds. Clear for diet from a speech therapy standpoint. -Continue tube feeds with plan to wean off and continue oral intake  Leukocytosis In setting of infection and steroid use.  Trending down.  Right critical lower limb ischemia Vascular surgery on board.  Plan is now for right AKA on 8/22; canceled on 8/19 secondary to emergent case.  Retroperitoneal hematoma Acute blood loss anemia CT scan of abdomen pelvis on 7/30 was significant for a large  (up to 17 cm) retroperitoneal bleed with associated left iliac [place] muscle intramuscular hemorrhage.  In setting of Coumadin use as an outpatient.  At that time, he was given vitamin K and FFP for therapeutic INR.  Repeat CT abdomen pelvis on 8/5 significant for unchanged hematoma.  Patient now restarted on anticoagulation for acute DVT and PEs. -Trend hemoglobin with daily CBC  AKI on CKD  stage IIIb Likely from ATN from hypotension/retroperitoneal bleed with  resultant displacement of left kidney with resultant hydronephrosis.  Patient is status post left percutaneous nephrostomy tube placement on 8/1.  Peak creatinine of 5.63.  Nephrology was consulted and patient was started on HD transiently with significant provement of renal function.  AKI now currently resolved and creatinine back to baseline.  Temporary dialysis catheter removed.   Hydronephrosis As mentioned above.  Percutaneous nephrostomy tube placed on 8/1 with recommendation to keep in place for at least 2 months in addition to outpatient urology follow-up.  Chronic systolic heart failure Stable.  Permanent atrial fibrillation Status post mitral valve replacement with bioprosthetic valve Currently on heparin for anticoagulation.  Also on amiodarone. -Continue amiodarone and heparin IV  Diabetes mellitus type 2 Well-controlled with hemoglobin A1c of 5.4%.  Primary pretension He has been borderline hypotensive this admission.  No antihypertensives.  Hyperlipidemia -Continue Lipitor  Pressure injury Medial coccyx, unknown if present on admission.   DVT prophylaxis: Heparin IV Code Status:   Code Status: Full Code Family Communication: None at bedside. Disposition Plan: Discharge likely to SNF in several days pending vascular surgery recommendations, stability of hemoglobin, transition to oral medications including anticoagulation, removal of NG tube and improvement of oral intake   Consultants:  Vascular surgery  Procedures:    Antimicrobials: Ceftriaxone IV Metronidazole IV Remdesivir IV Vancomycin IV   Subjective: No concerns today. Breathing is good. Still with a cough but somewhat better.  Objective: Vitals:   05/10/21 0323 05/10/21 0338 05/10/21 0755 05/10/21 1251  BP:   138/78   Pulse:   96   Resp:   18 18  Temp:   98 F (36.7 C) 97.8 F (36.6 C)  TempSrc:   Oral Oral  SpO2:   95%   Weight: 83.9 kg 83.9 kg    Height: 6' (1.829 m)        Intake/Output Summary (Last 24 hours) at 05/10/2021 1259 Last data filed at 05/10/2021 0953 Gross per 24 hour  Intake 1955.72 ml  Output 680 ml  Net 1275.72 ml    Filed Weights   05/09/21 0425 05/10/21 0323 05/10/21 0338  Weight: 84.5 kg 83.9 kg 83.9 kg    Examination:  General exam: Appears calm and comfortable Respiratory system: Clear to auscultation. Respiratory effort normal. Cardiovascular system: S1 & S2 heard, RRR. 2/6 systolic murmur Gastrointestinal system: Abdomen is nondistended, soft and nontender. No organomegaly or masses felt. Normal bowel sounds heard. Central nervous system: Alert and oriented. No focal neurological deficits. Musculoskeletal: 2+ LUE pitting edema. No calf tenderness Skin: No cyanosis. No rashes Psychiatry: Judgement and insight appear normal. Mood & affect appropriate.   Data Reviewed: I have personally reviewed following labs and imaging studies  CBC Lab Results  Component Value Date   WBC 11.7 (H) 05/10/2021   RBC 3.94 (L) 05/10/2021   HGB 9.6 (L) 05/10/2021   HCT 32.2 (L) 05/10/2021   MCV 81.7 05/10/2021   MCH 24.4 (L) 05/10/2021   PLT 217 05/10/2021   MCHC 29.8 (L) 05/10/2021   RDW 24.1 (H) 05/10/2021   LYMPHSABS 1,363 06/01/2016   MONOABS 517 06/01/2016   EOSABS 141 06/01/2016   BASOSABS 47 [id number]     Last metabolic panel Lab Results  Component Value Date   NA 141 05/10/2021   K 4.6 05/10/2021   CL 111 05/10/2021   CO2 24 05/10/2021   BUN 45 (H) 05/10/2021   CREATININE 1.13 05/10/2021   GLUCOSE 166 (H)  05/10/2021   GFRNONAA >60 05/10/2021   GFRAA 39 (L) 10/01/2020   CALCIUM 8.7 (L) 05/10/2021   PHOS 3.3 04/30/2021   PROT 5.8 (L) 05/04/2021   ALBUMIN 1.9 (L) 05/04/2021   LABGLOB 2.2 03/09/2021   BILITOT 1.1 05/04/2021   ALKPHOS 89 05/04/2021   AST 41 05/04/2021   ALT 29 05/04/2021   ANIONGAP 6 05/10/2021    CBG (last 3)  Recent Labs    05/10/21 0332 05/10/21 0752 05/10/21 1121  GLUCAP 177* 155*  140*      GFR: Estimated Creatinine Clearance: 60.1 mL/min (by C-G formula based on SCr of 1.13 mg/dL).  Coagulation Profile: No results for input(s): INR, PROTIME in the last 168 hours.   Recent Results (from the past 240 hour(s))  MRSA Next Gen by PCR, Nasal     Status: Abnormal   Collection Time: 05/03/21 12:18 AM   Specimen: Nasal Mucosa; Nasal Swab  Result Value Ref Range Status   MRSA by PCR Next Gen POSITIVE (A) NOT DETECTED Final    Comment: CRITICAL RESULT CALLED TO, READ BACK BY AND VERIFIED WITH: RN Corie Chiquito [phone] '@[id number]'$  THANEY Performed at [facility], [place] [address], [place], [place] [address]          Radiology Studies: No results found.      Scheduled Meds:  amiodarone  200 mg Oral Daily   vitamin C  500 mg Oral Daily   atorvastatin  40 mg Oral q1800   benzonatate  100 mg Oral TID   Chlorhexidine Gluconate Cloth  6 each Topical Daily   dextromethorphan-guaiFENesin  1 tablet Oral BID   docusate sodium  100 mg Oral BID   feeding supplement  237 mL Oral BID BM   feeding supplement (OSMOLITE 1.5 CAL)  1,000 mL Per Tube Q24H   feeding supplement (PROSource TF)  45 mL Per Tube TID   insulin aspart  0-15 Units Subcutaneous Q4H   midodrine  10 mg Oral TID WC   multivitamin with minerals  1 tablet Oral Daily   nystatin  5 mL Oral QID   pantoprazole  40 mg Oral Daily   sodium chloride flush  10-40 mL Intracatheter Q12H   sodium chloride flush  3 mL Intravenous Q12H   zinc sulfate  220 mg Oral Daily   Continuous Infusions:  heparin 1,750 Units/hr (05/10/21 0953)     LOS: 32 days     Cordelia Poche, MD [facility] 05/10/2021, 12:59 PM  If 7PM-7AM, please contact night-coverage [email or web address]

## 2021-05-10 NOTE — Progress Notes (Signed)
ANTICOAGULATION CONSULT NOTE - Follow Up Consult  Pharmacy Consult for IV heparin Indication: VTE treatment  Allergies  Allergen Reactions   Tuna [Fish Allergy] Nausea And Vomiting    Patient Measurements: Height: 6' (182.9 cm) Weight: 83.9 kg (185 lb) IBW/kg (Calculated) : 77.6 Heparin Dosing Weight: 88 kg  Vital Signs: Temp: 98 F (36.7 C) (08/21 0755) Temp Source: Oral (08/21 0755) BP: 138/78 (08/21 0755) Pulse Rate: 96 (08/21 0755)  Labs: Recent Labs    05/08/21 0400 05/09/21 0842 05/10/21 0407  HGB 10.3* 9.9* 9.6*  HCT 33.8* 32.4* 32.2*  PLT 250 218 217  HEPARINUNFRC 0.54 0.27* 0.49  CREATININE 1.29*  --  1.13    Estimated Creatinine Clearance: 60.1 mL/min (by C-G formula based on SCr of 1.13 mg/dL).   Assessment: 75 yr old man admitted on 04/08/21 with critical limb ischemic of RLE.  Pt with permanent atrial fibrillation, also with mitral valve replacement with bioprosthetic valve (on warfarin PTA for afib, on hold). Also recent dx of COVID infection. DVT prophylaxis had been on hold due to recent retroperitoneal hematoma, for which he was given vit K and FFP (repeat CT on 8/5 remained unchanged). Now with acute PE and bilateral DVTs, so pharmacy was consulted for IV heparin without boluses.   No bleeding noted per chart review. Heparin level this morning is therapeutic at 0.49, which is at the high end of the goal range. Therefore it is reasonable to empirically decrease the rate to avoid supratherapeutic levels.   Goal of Therapy:  Heparin 0.3-0.5 units/ml (lower end of goal range due to recent hematoma) Monitor platelets by anticoagulation protocol: Yes   Plan:  Empirically decrease heparin drip to 1750 units/hr Daily heparin level and CBC Monitor for s/sx of bleeding   Thank you for including pharmacy in the care of this patient.  Zenaida Deed, PharmD PGY1 Acute Care Pharmacy Resident  Phone: [phone] 05/10/2021  8:44 AM  Please check  [email or web address] for unit-specific pharmacy phone numbers.

## 2021-05-10 NOTE — Progress Notes (Signed)
Mobility Specialist: Progress Note   05/10/21 1738  Mobility  Activity Dangled on edge of bed  Level of Assistance Minimal assist, patient does 75% or more  Assistive Device None  Mobility Sit up in bed/chair position for meals  Mobility Response Tolerated well  Mobility performed by Mobility specialist  $Mobility charge 1 Mobility   Pt required minA for bed mobility and was able to balance on his own while sitting EOB. Pt performed LE exercises while sitting EOB with c/o pain in his LLE, no rating given. Pt back to bed after session with call bell and phone at his side.   [name] Mobility Specialist Mobility Specialist Phone: [phone]

## 2021-05-11 ENCOUNTER — Inpatient Hospital Stay (HOSPITAL_COMMUNITY): Payer: Medicare HMO

## 2021-05-11 ENCOUNTER — Encounter (HOSPITAL_COMMUNITY)
Admission: RE | Disposition: A | Discharge status: Rehabilitation Facility | Payer: Self-pay | Source: Home / Self Care | Attending: Internal Medicine

## 2021-05-11 DIAGNOSIS — I5022 Chronic systolic (congestive) heart failure: Secondary | ICD-10-CM | POA: Diagnosis not present

## 2021-05-11 DIAGNOSIS — I70229 Atherosclerosis of native arteries of extremities with rest pain, unspecified extremity: Secondary | ICD-10-CM | POA: Diagnosis not present

## 2021-05-11 DIAGNOSIS — Z95810 Presence of automatic (implantable) cardiac defibrillator: Secondary | ICD-10-CM | POA: Diagnosis not present

## 2021-05-11 DIAGNOSIS — I10 Essential (primary) hypertension: Secondary | ICD-10-CM | POA: Diagnosis not present

## 2021-05-11 LAB — GLUCOSE, CAPILLARY
Glucose-Capillary: 108 mg/dL — ABNORMAL HIGH (ref 70–99)
Glucose-Capillary: 113 mg/dL — ABNORMAL HIGH (ref 70–99)
Glucose-Capillary: 123 mg/dL — ABNORMAL HIGH (ref 70–99)
Glucose-Capillary: 177 mg/dL — ABNORMAL HIGH (ref 70–99)
Glucose-Capillary: 53 mg/dL — ABNORMAL LOW (ref 70–99)
Glucose-Capillary: 78 mg/dL (ref 70–99)
Glucose-Capillary: 93 mg/dL (ref 70–99)
Glucose-Capillary: 95 mg/dL (ref 70–99)

## 2021-05-11 LAB — CBC
HCT: 32 % — ABNORMAL LOW (ref 39.0–52.0)
Hemoglobin: 9.7 g/dL — ABNORMAL LOW (ref 13.0–17.0)
MCH: 24.7 pg — ABNORMAL LOW (ref 26.0–34.0)
MCHC: 30.3 g/dL (ref 30.0–36.0)
MCV: 81.6 fL (ref 80.0–100.0)
Platelets: 192 10*3/uL (ref 150–400)
RBC: 3.92 MIL/uL — ABNORMAL LOW (ref 4.22–5.81)
RDW: 24.7 % — ABNORMAL HIGH (ref 11.5–15.5)
WBC: 10.2 10*3/uL (ref 4.0–10.5)
nRBC: 0.2 % (ref 0.0–0.2)

## 2021-05-11 LAB — HEPARIN LEVEL (UNFRACTIONATED): Heparin Unfractionated: 0.5 IU/mL (ref 0.30–0.70)

## 2021-05-11 SURGERY — AMPUTATION, ABOVE KNEE
Anesthesia: General | Site: Knee | Laterality: Right

## 2021-05-11 NOTE — Progress Notes (Signed)
PT Cancellation Note  Patient Details Name: Ryan [name] MRN: [id number] DOB: Jan 15, 1946   Cancelled Treatment:    Reason Eval/Treat Not Completed: Medical issues which prohibited therapy. Patient now scheduled for AKA today. Will re-assess after surgery.    Robet Crutchfield 05/11/2021, 10:18 AM

## 2021-05-11 NOTE — Progress Notes (Signed)
[facility] [place] PROGRESS NOTE  ASSESSMENT / PLAN: Ryan [name] is a 75 y.o. male with non-salvageable right lower extremity. Patient received tube feeding until this morning. Surgery rescheduled to tomorrow with Dr. Scot Dock.   SUBJECTIVE: No complaints.  OBJECTIVE: BP (!) 149/75 (BP Location: Left Arm)   Pulse 73   Temp 98.6 F (37 C) (Oral)   Resp 16   Ht 6' (1.829 m)   Wt 83.9 kg   SpO2 100%   BMI 25.09 kg/m   Intake/Output Summary (Last 24 hours) at 05/11/2021 1147 Last data filed at 05/11/2021 0800 Gross per 24 hour  Intake 1114.39 ml  Output 375 ml  Net 739.39 ml    No distress Unlabored breathing Unchanged appearance of RLE  CBC Latest Ref Rng & Units 05/11/2021 05/10/2021 05/09/2021  WBC 4.0 - 10.5 K/uL 10.2 11.7(H) 12.9(H)  Hemoglobin 13.0 - 17.0 g/dL 9.7(L) 9.6(L) 9.9(L)  Hematocrit 39.0 - 52.0 % 32.0(L) 32.2(L) 32.4(L)  Platelets 150 - 400 K/uL 192 217 218     CMP Latest Ref Rng & Units 05/10/2021 05/08/2021 05/06/2021  Glucose 70 - 99 mg/dL 166(H) 106(H) 137(H)  BUN 8 - 23 mg/dL 45(H) 50(H) 59(H)  Creatinine 0.61 - 1.24 mg/dL 1.13 1.29(H) 1.57(H)  Sodium 135 - 145 mmol/L 141 141 143  Potassium 3.5 - 5.1 mmol/L 4.6 4.3 4.1  Chloride 98 - 111 mmol/L 111 111 111  CO2 22 - 32 mmol/L '24 24 23  '$ Calcium 8.9 - 10.3 mg/dL 8.7(L) 8.6(L) 8.4(L)  Total Protein 6.5 - 8.1 g/dL - - -  Total Bilirubin 0.3 - 1.2 mg/dL - - -  Alkaline Phos 38 - 126 U/L - - -  AST 15 - 41 U/L - - -  ALT 0 - 44 U/L - - -    Estimated Creatinine Clearance: 60.1 mL/min (by C-G formula based on SCr of 1.13 mg/dL).  [name]. [name], MD [facility] Phone Number: [phone] 05/11/2021 11:47 AM

## 2021-05-11 NOTE — Progress Notes (Signed)
ANTICOAGULATION CONSULT NOTE - Follow Up Consult  Pharmacy Consult for Heparin Indication: pulmonary embolus and DVT  Allergies  Allergen Reactions   Tuna [Fish Allergy] Nausea And Vomiting    Patient Measurements: Height: 6' (182.9 cm) Weight: 83.9 kg (185 lb) IBW/kg (Calculated) : 77.6 Heparin Dosing Weight: 83.9 kg  Vital Signs: Temp: 98.6 F (37 C) (08/22 0812) Temp Source: Oral (08/22 0812) BP: 149/75 (08/22 0812) Pulse Rate: 73 (08/22 0812)  Labs: Recent Labs    05/09/21 0842 05/10/21 0407 05/11/21 0430  HGB 9.9* 9.6* 9.7*  HCT 32.4* 32.2* 32.0*  PLT 218 217 192  HEPARINUNFRC 0.27* 0.49 0.50  CREATININE  --  1.13  --     Estimated Creatinine Clearance: 60.1 mL/min (by C-G formula based on SCr of 1.13 mg/dL).  Assessment: 75 yr old man admitted on 04/08/21 with critical limb ischemic of RLE.  Pt with permanent atrial fibrillation, also with mitral valve replacement with bioprosthetic valve (on warfarin PTA for afib, on hold). Also recent dx of COVID infection. DVT prophylaxis had been on hold due to recent retroperitoneal hematoma, for which he was given vit K and FFP (repeat CT on 8/5 remained unchanged). Now with acute PE and bilateral DVTs 8/16-8/17, Pharmacy was consulted for IV heparin without boluses.   Heparin level remains therapeutic (0.50) on 1750 units/hr. Mid-range therapeutic. No bleeding reported.  R AKA previously planned for today, but has been postponed until 8/23.  Goal of Therapy:  Heparin level 0.3-0.5 units/ml (lower end of therapeutic range due to recent hematoma) Monitor platelets by anticoagulation protocol: Yes   Plan:  Continue heparin drip at 1750 units/hr. Daily heparin level and CBC. Monitor for signs/symptoms of bleeding. Follow up operative plans and holding/resuming anticoagulation pre- and post-procedure.  Arty Baumgartner, RPh 05/11/2021,12:38 PM

## 2021-05-11 NOTE — Progress Notes (Signed)
PROGRESS NOTE    Ryan [name]  [id number] DOB: 01-27-1946 DOA: 04/08/2021 PCP: [name], MD   Brief Narrative: Ryan [name] is a 75 y.o. male with a history of atrial fibrillation and mitral valve replacement on Coumadin, chronic systolic heart failure status post AICD, primary hypertension, peripheral vascular disease.  Patient presented secondary to right foot wound secondary to critical limb ischemia.  Vascular surgery was consulted.  Patient underwent partial right foot amputation with excisional debridement and wound VAC placement.  Patient continued to have worsening evidence of limb ischemia with vascular surgery now recommending above knee amputation.  During admission, patient developed significant AKI in setting of left hydronephrosis and likely ATN from hypotension.  Patient required hemodialysis transiently but this has been discontinued.   Assessment & Plan:   Principal Problem:   Critical lower limb ischemia (HCC) Active Problems:   Chronic systolic heart failure (HCC)   Status post mitral valve replacement with bioprosthetic valve   Diabetes mellitus ([place])   Essential hypertension   ICD (implantable cardioverter-defibrillator) in place   Stage 3 chronic kidney disease (HCC)   Pressure injury of skin   Acute respiratory failure with hypoxia Secondary to multiple issues including acute PE, pneumonia.  Patient required significantly elevated oxygen delivery up to 25 L/min on heated high flow nasal cannula and is now been weaned to about 2 L/min. Breathing better after Lasix IV x1 on 8/19 -Wean oxygen to room air as able -Chest x-ray  Acute pulmonary embolism Acute LUE DVT In setting of COVID infection, discontinuation of Coumadin secondary to retroperitoneal hematoma/intramuscular hemorrhage in addition to prolonged hospitalization.  Diagnosed on 8/16 via perfusion scan which was consistent with right upper and right lower lobe PE.  Heparin IV restarted  after discussion with vascular surgery. -Continue heparin IV  Possible thrush May be contributing to pain with swallowing.  Does not appear to have esophageal symptoms. Symptoms somewhat improved but may be related to cough more than possible thrush. -Nystatin solution QID x7 days at least based on response  Possible aspiration Initial concern for cause of hypoxia prior to diagnosis of PE.  With diagnosis of PE in addition to multifocal pneumonia from COVID-19, does not appear that aspiration pneumonia is likely.  Patient was treated empirically with Zosyn and vancomycin which have been discontinued.  Speech therapy was consulted and recommend regular diet with thin liquids.  COVID-19 pneumonia Multifocal pneumonia Resolved. Patient treated with a complete 5-day course remdesivir.  Currently on Decadron secondary to associated hypoxia. -Continue Decadron to complete 10-day course -Tessalon perles for cough  Poor oral intake CorTrak placed. Dietitian consulted. Patient appears to have difficulty with eating secondary to pain with swallowing. Currently on tube feeds. Clear for diet from a speech therapy standpoint. -Continue tube feeds with plan to wean off and continue oral intake  Leukocytosis In setting of infection and steroid use.  Trending down.  Right critical lower limb ischemia Vascular surgery on board.  Plan is now for right AKA on 8/22; canceled on 8/19 secondary to emergent case.  Retroperitoneal hematoma Acute blood loss anemia CT scan of abdomen pelvis on 7/30 was significant for a large  (up to 17 cm) retroperitoneal bleed with associated left iliac [place] muscle intramuscular hemorrhage.  In setting of Coumadin use as an outpatient.  At that time, he was given vitamin K and FFP for therapeutic INR.  Repeat CT abdomen pelvis on 8/5 significant for unchanged hematoma.  Patient now restarted on anticoagulation for  acute DVT and PEs. -Trend hemoglobin with daily CBC  AKI on CKD  stage IIIb Likely from ATN from hypotension/retroperitoneal bleed with resultant displacement of left kidney with resultant hydronephrosis.  Patient is status post left percutaneous nephrostomy tube placement on 8/1.  Peak creatinine of 5.63.  Nephrology was consulted and patient was started on HD transiently with significant provement of renal function.  AKI now currently resolved and creatinine back to baseline.  Temporary dialysis catheter removed.   Hydronephrosis As mentioned above.  Percutaneous nephrostomy tube placed on 8/1 with recommendation to keep in place for at least 2 months in addition to outpatient urology follow-up.  Chronic systolic heart failure Stable.  Permanent atrial fibrillation Status post mitral valve replacement with bioprosthetic valve Currently on heparin for anticoagulation.  Also on amiodarone. -Continue amiodarone and heparin IV  Diabetes mellitus type 2 Well-controlled with hemoglobin A1c of 5.4%.  Primary pretension He has been borderline hypotensive this admission.  No antihypertensives.  Hyperlipidemia -Continue Lipitor  Pressure injury Medial coccyx, unknown if present on admission.   DVT prophylaxis: Heparin IV Code Status:   Code Status: Full Code Family Communication: Sister at bedside. Disposition Plan: Discharge likely to SNF in several days pending vascular surgery recommendations, stability of hemoglobin, transition to oral medications including anticoagulation, removal of NG tube and improvement of oral intake   Consultants:  Vascular surgery  Procedures:    Antimicrobials: Ceftriaxone IV Metronidazole IV Remdesivir IV Vancomycin IV   Subjective: Coughing. Some throat pain. No other concerns  Objective: Vitals:   05/10/21 2050 05/11/21 0029 05/11/21 0311 05/11/21 0812  BP: 136/67 123/81 131/70 (!) 149/75  Pulse: 70 72 66 73  Resp: '17 16  16  '$ Temp: 97.9 F (36.6 C) 97.6 F (36.4 C) 98.4 F (36.9 C) 98.6 F (37 C)   TempSrc: Oral Oral Oral Oral  SpO2: 99% 96% 100% 100%  Weight:      Height:        Intake/Output Summary (Last 24 hours) at 05/11/2021 0954 Last data filed at 05/11/2021 0700 Gross per 24 hour  Intake 1084.39 ml  Output 375 ml  Net 709.39 ml    Filed Weights   05/09/21 0425 05/10/21 0323 05/10/21 0338  Weight: 84.5 kg 83.9 kg 83.9 kg    Examination:  General exam: Appears calm and comfortable Respiratory system: Clear to auscultation anteriorly. Respiratory effort normal. Cardiovascular system: S1 & S2 heard, RRR. No murmurs, rubs, gallops or clicks. Gastrointestinal system: Abdomen is nondistended, soft and nontender. No organomegaly or masses felt. Normal bowel sounds heard. Central nervous system: Alert and oriented. No focal neurological deficits. Musculoskeletal: LUE 2+ pitting edema. No calf tenderness Skin: Right foot in bandage wrap but exposed toes significant for dry gangrene Psychiatry: Judgement and insight appear normal. Blunt affect  Data Reviewed: I have personally reviewed following labs and imaging studies  CBC Lab Results  Component Value Date   WBC 10.2 05/11/2021   RBC 3.92 (L) 05/11/2021   HGB 9.7 (L) 05/11/2021   HCT 32.0 (L) 05/11/2021   MCV 81.6 05/11/2021   MCH 24.7 (L) 05/11/2021   PLT 192 05/11/2021   MCHC 30.3 05/11/2021   RDW 24.7 (H) 05/11/2021   LYMPHSABS 1,363 06/01/2016   MONOABS 517 06/01/2016   EOSABS 141 06/01/2016   BASOSABS 47 [id number]     Last metabolic panel Lab Results  Component Value Date   NA 141 05/10/2021   K 4.6 05/10/2021   CL 111 05/10/2021   CO2 24  05/10/2021   BUN 45 (H) 05/10/2021   CREATININE 1.13 05/10/2021   GLUCOSE 166 (H) 05/10/2021   GFRNONAA >60 05/10/2021   GFRAA 39 (L) 10/01/2020   CALCIUM 8.7 (L) 05/10/2021   PHOS 3.3 04/30/2021   PROT 5.8 (L) 05/04/2021   ALBUMIN 1.9 (L) 05/04/2021   LABGLOB 2.2 03/09/2021   BILITOT 1.1 05/04/2021   ALKPHOS 89 05/04/2021   AST 41 05/04/2021   ALT 29  05/04/2021   ANIONGAP 6 05/10/2021    CBG (last 3)  Recent Labs    05/11/21 0022 05/11/21 0409 05/11/21 0814  GLUCAP 177* 108* 123*      GFR: Estimated Creatinine Clearance: 60.1 mL/min (by C-G formula based on SCr of 1.13 mg/dL).  Coagulation Profile: No results for input(s): INR, PROTIME in the last 168 hours.   Recent Results (from the past 240 hour(s))  MRSA Next Gen by PCR, Nasal     Status: Abnormal   Collection Time: 05/03/21 12:18 AM   Specimen: Nasal Mucosa; Nasal Swab  Result Value Ref Range Status   MRSA by PCR Next Gen POSITIVE (A) NOT DETECTED Final    Comment: CRITICAL RESULT CALLED TO, READ BACK BY AND VERIFIED WITH: RN Corie Chiquito [phone]'$  THANEY Performed at [facility], [place] [name] [place], [place] [address]          Radiology Studies: No results found.      Scheduled Meds:  amiodarone  200 mg Oral Daily   vitamin C  500 mg Oral Daily   atorvastatin  40 mg Oral q1800   benzonatate  100 mg Oral TID   Chlorhexidine Gluconate Cloth  6 each Topical Daily   dextromethorphan-guaiFENesin  1 tablet Oral BID   docusate sodium  100 mg Oral BID   feeding supplement  237 mL Oral BID BM   feeding supplement (OSMOLITE 1.5 CAL)  1,000 mL Per Tube Q24H   feeding supplement (PROSource TF)  45 mL Per Tube TID   insulin aspart  0-15 Units Subcutaneous Q4H   midodrine  10 mg Oral TID WC   multivitamin with minerals  1 tablet Oral Daily   nystatin  5 mL Oral QID   pantoprazole  40 mg Oral Daily   sodium chloride flush  10-40 mL Intracatheter Q12H   sodium chloride flush  3 mL Intravenous Q12H   zinc sulfate  220 mg Oral Daily   Continuous Infusions:  heparin 1,750 Units/hr (05/10/21 1800)     LOS: 33 days     Cordelia Poche, MD [facility] 05/11/2021, 9:54 AM  If 7PM-7AM, please contact night-coverage [email or web address]

## 2021-05-11 NOTE — Progress Notes (Signed)
SLP Cancellation Note  Patient Details Name: Ryan [name] MRN: [id number] DOB: 03/27/1946   Cancelled treatment:       Reason Eval/Treat Not Completed: Other (Pt NPO today for surgery. ST to continue efforts.)    Ellwood Dense, [place], [place] Office Number: [phone]  Acie Fredrickson 05/11/2021, 8:53 AM

## 2021-05-12 ENCOUNTER — Encounter (HOSPITAL_COMMUNITY)
Admission: RE | Disposition: A | Discharge status: Rehabilitation Facility | Payer: Self-pay | Source: Home / Self Care | Attending: Internal Medicine

## 2021-05-12 ENCOUNTER — Encounter (HOSPITAL_COMMUNITY): Payer: Medicare HMO

## 2021-05-12 ENCOUNTER — Inpatient Hospital Stay (HOSPITAL_COMMUNITY): Payer: Medicare HMO | Admitting: Registered Nurse

## 2021-05-12 DIAGNOSIS — I5022 Chronic systolic (congestive) heart failure: Secondary | ICD-10-CM | POA: Diagnosis not present

## 2021-05-12 DIAGNOSIS — I70229 Atherosclerosis of native arteries of extremities with rest pain, unspecified extremity: Secondary | ICD-10-CM | POA: Diagnosis not present

## 2021-05-12 DIAGNOSIS — I10 Essential (primary) hypertension: Secondary | ICD-10-CM | POA: Diagnosis not present

## 2021-05-12 DIAGNOSIS — I70261 Atherosclerosis of native arteries of extremities with gangrene, right leg: Secondary | ICD-10-CM

## 2021-05-12 DIAGNOSIS — Z95810 Presence of automatic (implantable) cardiac defibrillator: Secondary | ICD-10-CM | POA: Diagnosis not present

## 2021-05-12 HISTORY — PX: AMPUTATION: SHX166

## 2021-05-12 LAB — HEPARIN LEVEL (UNFRACTIONATED): Heparin Unfractionated: 0.4 IU/mL (ref 0.30–0.70)

## 2021-05-12 LAB — CBC
HCT: 31.8 % — ABNORMAL LOW (ref 39.0–52.0)
Hemoglobin: 9.7 g/dL — ABNORMAL LOW (ref 13.0–17.0)
MCH: 24.8 pg — ABNORMAL LOW (ref 26.0–34.0)
MCHC: 30.5 g/dL (ref 30.0–36.0)
MCV: 81.3 fL (ref 80.0–100.0)
Platelets: 141 10*3/uL — ABNORMAL LOW (ref 150–400)
RBC: 3.91 MIL/uL — ABNORMAL LOW (ref 4.22–5.81)
RDW: 25 % — ABNORMAL HIGH (ref 11.5–15.5)
WBC: 10.2 10*3/uL (ref 4.0–10.5)
nRBC: 0 % (ref 0.0–0.2)

## 2021-05-12 LAB — GLUCOSE, CAPILLARY
Glucose-Capillary: 82 mg/dL (ref 70–99)
Glucose-Capillary: 73 mg/dL (ref 70–99)
Glucose-Capillary: 71 mg/dL (ref 70–99)
Glucose-Capillary: 132 mg/dL — ABNORMAL HIGH (ref 70–99)
Glucose-Capillary: 91 mg/dL (ref 70–99)
Glucose-Capillary: 67 mg/dL — ABNORMAL LOW (ref 70–99)
Glucose-Capillary: 126 mg/dL — ABNORMAL HIGH (ref 70–99)

## 2021-05-12 LAB — TYPE AND SCREEN
ABO/RH(D): A POS
Antibody Screen: NEGATIVE

## 2021-05-12 SURGERY — AMPUTATION, ABOVE KNEE
Anesthesia: General | Site: Knee | Laterality: Right

## 2021-05-12 MED ORDER — POVIDONE-IODINE 7.5 % EX SOLN
CUTANEOUS | Status: DC | PRN
  Administered 2021-05-12: 1 via TOPICAL

## 2021-05-12 MED ORDER — ROCURONIUM BROMIDE 10 MG/ML (PF) SYRINGE
PREFILLED_SYRINGE | INTRAVENOUS | Status: DC | PRN
  Administered 2021-05-12: 30 mg via INTRAVENOUS

## 2021-05-12 MED ORDER — PHENYLEPHRINE HCL-NACL 20-0.9 MG/250ML-% IV SOLN
INTRAVENOUS | Status: DC | PRN
  Administered 2021-05-12: 40 ug/min via INTRAVENOUS

## 2021-05-12 MED ORDER — BACITRACIN ZINC 500 UNIT/GM EX OINT
TOPICAL_OINTMENT | CUTANEOUS | Status: DC | PRN
  Administered 2021-05-12: 1 via TOPICAL

## 2021-05-12 MED ORDER — ONDANSETRON HCL 4 MG/2ML IJ SOLN
INTRAMUSCULAR | Status: DC | PRN
  Administered 2021-05-12: 4 mg via INTRAVENOUS

## 2021-05-12 MED ORDER — OSMOLITE 1.5 CAL PO LIQD
1050.0000 mL | ORAL | Status: DC
  Administered 2021-05-12 – 2021-05-14 (×3): 1050 mL
  Filled 2021-05-12 (×4): qty 2000

## 2021-05-12 MED ORDER — SUCCINYLCHOLINE CHLORIDE 200 MG/10ML IV SOSY
PREFILLED_SYRINGE | INTRAVENOUS | Status: DC | PRN
  Administered 2021-05-12: 100 mg via INTRAVENOUS

## 2021-05-12 MED ORDER — SUGAMMADEX SODIUM 200 MG/2ML IV SOLN
INTRAVENOUS | Status: DC | PRN
  Administered 2021-05-12: 200 mg via INTRAVENOUS

## 2021-05-12 MED ORDER — CEFAZOLIN SODIUM-DEXTROSE 2-3 GM-%(50ML) IV SOLR
INTRAVENOUS | Status: DC | PRN
  Administered 2021-05-12: 2 g via INTRAVENOUS

## 2021-05-12 MED ORDER — ETOMIDATE 2 MG/ML IV SOLN
INTRAVENOUS | Status: DC | PRN
  Administered 2021-05-12: 10 mg via INTRAVENOUS

## 2021-05-12 MED ORDER — DEXTROSE 50 % IV SOLN
25.0000 mL | Freq: Once | INTRAVENOUS | Status: AC
  Administered 2021-05-12: 25 mL via INTRAVENOUS

## 2021-05-12 MED ORDER — DEXTROSE 50 % IV SOLN
INTRAVENOUS | Status: AC
  Filled 2021-05-12: qty 50

## 2021-05-12 MED ORDER — EPHEDRINE SULFATE-NACL 50-0.9 MG/10ML-% IV SOSY
PREFILLED_SYRINGE | INTRAVENOUS | Status: DC | PRN
  Administered 2021-05-12: 5 mg via INTRAVENOUS

## 2021-05-12 MED ORDER — PROSOURCE TF PO LIQD
45.0000 mL | Freq: Four times a day (QID) | ORAL | Status: DC
Start: 2021-05-12 — End: 2021-05-15
  Administered 2021-05-12 – 2021-05-14 (×5): 45 mL
  Filled 2021-05-12 (×6): qty 45

## 2021-05-12 MED ORDER — DEXTROSE 50 % IV SOLN
12.5000 g | INTRAVENOUS | Status: AC
  Administered 2021-05-12: 12.5 g via INTRAVENOUS

## 2021-05-12 MED ORDER — STERILE WATER FOR IRRIGATION IR SOLN
Status: DC | PRN
  Administered 2021-05-12: 1000 mL

## 2021-05-12 MED ORDER — SODIUM CHLORIDE 0.9 % IV SOLN: INTRAVENOUS | Status: DC | PRN

## 2021-05-12 MED ORDER — 0.9 % SODIUM CHLORIDE (POUR BTL) OPTIME
TOPICAL | Status: DC | PRN
  Administered 2021-05-12 (×2): 1000 mL

## 2021-05-12 MED ORDER — HEPARIN (PORCINE) 25000 UT/250ML-% IV SOLN
1750.0000 [IU]/h | INTRAVENOUS | Status: DC
  Administered 2021-05-12 – 2021-05-13 (×3): 1750 [IU]/h via INTRAVENOUS
  Filled 2021-05-12 (×3): qty 250

## 2021-05-12 MED ORDER — PHENYLEPHRINE 40 MCG/ML (10ML) SYRINGE FOR IV PUSH (FOR BLOOD PRESSURE SUPPORT)
PREFILLED_SYRINGE | INTRAVENOUS | Status: DC | PRN
  Administered 2021-05-12: 80 ug via INTRAVENOUS

## 2021-05-12 MED ORDER — MUPIROCIN 2 % EX OINT
1.0000 "application " | TOPICAL_OINTMENT | Freq: Two times a day (BID) | CUTANEOUS | Status: AC
  Administered 2021-05-12 – 2021-05-16 (×10): 1 via NASAL
  Filled 2021-05-12: qty 22

## 2021-05-12 MED ORDER — POVIDONE-IODINE 10 % EX SOLN
CUTANEOUS | Status: DC | PRN
Start: 2021-05-12 — End: 2021-05-12
  Administered 2021-05-12: 1 via TOPICAL

## 2021-05-12 MED ORDER — FENTANYL CITRATE (PF) 250 MCG/5ML IJ SOLN
INTRAMUSCULAR | Status: DC | PRN
  Administered 2021-05-12 (×3): 50 ug via INTRAVENOUS

## 2021-05-12 MED ORDER — HYDROMORPHONE HCL 1 MG/ML IJ SOLN
INTRAMUSCULAR | Status: AC
  Filled 2021-05-12: qty 1

## 2021-05-12 MED ORDER — HYDROCOD POLST-CPM POLST ER 10-8 MG/5ML PO SUER: 5.0000 mL | Freq: Every evening | ORAL | Status: DC | PRN

## 2021-05-12 MED ORDER — CEFAZOLIN SODIUM 1 G IJ SOLR
INTRAMUSCULAR | Status: AC
  Filled 2021-05-12: qty 20

## 2021-05-12 MED ORDER — BACITRACIN ZINC 500 UNIT/GM EX OINT
TOPICAL_OINTMENT | CUTANEOUS | Status: AC
  Filled 2021-05-12: qty 28.35

## 2021-05-12 MED ORDER — MORPHINE SULFATE (PF) 2 MG/ML IV SOLN
2.0000 mg | INTRAVENOUS | Status: DC | PRN
  Administered 2021-05-12 – 2021-05-19 (×8): 2 mg via INTRAVENOUS
  Filled 2021-05-12 (×8): qty 1

## 2021-05-12 MED ORDER — PROPOFOL 10 MG/ML IV BOLUS
INTRAVENOUS | Status: AC
  Filled 2021-05-12: qty 20

## 2021-05-12 MED ORDER — ALBUMIN HUMAN 5 % IV SOLN: INTRAVENOUS | Status: DC | PRN

## 2021-05-12 MED ORDER — HYDROMORPHONE HCL 1 MG/ML IJ SOLN
0.2500 mg | INTRAMUSCULAR | Status: DC | PRN
  Administered 2021-05-12: 0.5 mg via INTRAVENOUS

## 2021-05-12 MED ORDER — FENTANYL CITRATE (PF) 250 MCG/5ML IJ SOLN
INTRAMUSCULAR | Status: AC
  Filled 2021-05-12: qty 5

## 2021-05-12 SURGICAL SUPPLY — 62 items
BAG COUNTER SPONGE SURGICOUNT (BAG) ×2 IMPLANT
BAG SPNG CNTER NS LX DISP (BAG) ×1
BANDAGE ESMARK 6X9 LF (GAUZE/BANDAGES/DRESSINGS) ×1 IMPLANT
BLADE SAW RECIP 87.9 MT (BLADE) ×2 IMPLANT
BNDG CMPR 9X6 STRL LF SNTH (GAUZE/BANDAGES/DRESSINGS) ×1
BNDG COHESIVE 3X5 TAN STRL LF (GAUZE/BANDAGES/DRESSINGS) ×1 IMPLANT
BNDG COHESIVE 6X5 TAN STRL LF (GAUZE/BANDAGES/DRESSINGS) ×1 IMPLANT
BNDG ELASTIC 4X5.8 VLCR STR LF (GAUZE/BANDAGES/DRESSINGS) ×3 IMPLANT
BNDG ESMARK 6X9 LF (GAUZE/BANDAGES/DRESSINGS) ×2
BNDG GAUZE ELAST 4 BULKY (GAUZE/BANDAGES/DRESSINGS) ×3 IMPLANT
CANISTER SUCT 3000ML PPV (MISCELLANEOUS) ×2 IMPLANT
COVER SURGICAL LIGHT HANDLE (MISCELLANEOUS) ×3 IMPLANT
CUFF TOURN SGL QUICK 18X4 (TOURNIQUET CUFF) IMPLANT
CUFF TOURN SGL QUICK 24 (TOURNIQUET CUFF)
CUFF TOURN SGL QUICK 34 (TOURNIQUET CUFF) ×2
CUFF TOURN SGL QUICK 42 (TOURNIQUET CUFF) IMPLANT
CUFF TRNQT CYL 24X4X16.5-23 (TOURNIQUET CUFF) IMPLANT
CUFF TRNQT CYL 34X4.125X (TOURNIQUET CUFF) IMPLANT
CUFF TRNQT CYL 34X4X40X1 (TOURNIQUET CUFF) IMPLANT
DRAIN CHANNEL 19F RND (DRAIN) IMPLANT
DRAPE HALF SHEET 40X57 (DRAPES) ×2 IMPLANT
DRAPE ORTHO SPLIT 77X108 STRL (DRAPES) ×4
DRAPE SURG ORHT 6 SPLT 77X108 (DRAPES) ×2 IMPLANT
DRAPE U-SHAPE 47X51 STRL (DRAPES) ×2 IMPLANT
DRSG ADAPTIC 3X8 NADH LF (GAUZE/BANDAGES/DRESSINGS) ×2 IMPLANT
ELECT CAUTERY BLADE 6.4 (BLADE) ×2 IMPLANT
ELECT REM PT RETURN 9FT ADLT (ELECTROSURGICAL) ×2
ELECTRODE REM PT RTRN 9FT ADLT (ELECTROSURGICAL) ×1 IMPLANT
EVACUATOR SILICONE 100CC (DRAIN) IMPLANT
GAUZE SPONGE 4X4 12PLY STRL (GAUZE/BANDAGES/DRESSINGS) ×3 IMPLANT
GLOVE SRG 8 PF TXTR STRL LF DI (GLOVE) ×1 IMPLANT
GLOVE SURG ENC MOIS LTX SZ7.5 (GLOVE) ×2 IMPLANT
GLOVE SURG MICRO LTX SZ6.5 (GLOVE) ×3 IMPLANT
GLOVE SURG UNDER POLY LF SZ6.5 (GLOVE) ×1 IMPLANT
GLOVE SURG UNDER POLY LF SZ8 (GLOVE) ×2
GOWN STRL REUS W/ TWL LRG LVL3 (GOWN DISPOSABLE) ×3 IMPLANT
GOWN STRL REUS W/TWL LRG LVL3 (GOWN DISPOSABLE) ×6
KIT BASIN OR (CUSTOM PROCEDURE TRAY) ×2 IMPLANT
KIT TURNOVER KIT B (KITS) ×2 IMPLANT
NS IRRIG 1000ML POUR BTL (IV SOLUTION) ×3 IMPLANT
PACK GENERAL/GYN (CUSTOM PROCEDURE TRAY) ×2 IMPLANT
PAD ARMBOARD 7.5X6 YLW CONV (MISCELLANEOUS) ×4 IMPLANT
PADDING CAST ABS 4INX4YD NS (CAST SUPPLIES) ×1
PADDING CAST ABS COTTON 4X4 ST (CAST SUPPLIES) IMPLANT
RASP HELIOCORDIAL MED (MISCELLANEOUS) IMPLANT
SPONGE T-LAP 18X18 ~~LOC~~+RFID (SPONGE) ×4 IMPLANT
STAPLER VISISTAT 35W (STAPLE) ×2 IMPLANT
STOCKINETTE IMPERVIOUS LG (DRAPES) ×2 IMPLANT
SUT ETHILON 3 0 PS 1 (SUTURE) IMPLANT
SUT SILK 0 TIES 10X30 (SUTURE) ×2 IMPLANT
SUT SILK 2 0 (SUTURE) ×4
SUT SILK 2 0 SH CR/8 (SUTURE) ×3 IMPLANT
SUT SILK 2-0 18XBRD TIE 12 (SUTURE) ×1 IMPLANT
SUT SILK 3 0 (SUTURE) ×4
SUT SILK 3-0 18XBRD TIE 12 (SUTURE) ×1 IMPLANT
SUT SILK 4 0 (SUTURE) ×2
SUT SILK 4-0 18XBRD TIE 12 (SUTURE) IMPLANT
SUT VIC AB 2-0 CT1 18 (SUTURE) ×3 IMPLANT
TOWEL GREEN STERILE (TOWEL DISPOSABLE) ×3 IMPLANT
TOWEL GREEN STERILE FF (TOWEL DISPOSABLE) ×2 IMPLANT
UNDERPAD 30X36 HEAVY ABSORB (UNDERPADS AND DIAPERS) ×2 IMPLANT
WATER STERILE IRR 1000ML POUR (IV SOLUTION) ×2 IMPLANT

## 2021-05-12 NOTE — Anesthesia Procedure Notes (Signed)
Procedure Name: Intubation Date/Time: 05/12/2021 10:53 AM Performed by: Trinna Post., CRNA Pre-anesthesia Checklist: Patient identified, Emergency Drugs available, Suction available, Patient being monitored and Timeout performed Patient Re-evaluated:Patient Re-evaluated prior to induction Oxygen Delivery Method: Circle system utilized Preoxygenation: Pre-oxygenation with 100% oxygen Induction Type: IV induction, Rapid sequence and Cricoid Pressure applied Laryngoscope Size: Mac and 4 Grade View: Grade I Tube type: Oral Tube size: 7.5 mm Number of attempts: 1 Airway Equipment and Method: Stylet Placement Confirmation: ETT inserted through vocal cords under direct vision, positive ETCO2 and breath sounds checked- equal and bilateral Secured at: 23 cm Tube secured with: Tape Dental Injury: Teeth and Oropharynx as per pre-operative assessment

## 2021-05-12 NOTE — Progress Notes (Signed)
Per MD, hold heparin on call to OR

## 2021-05-12 NOTE — Progress Notes (Signed)
Patient returned to 4E23 from PACU. VSS and patient is drowsy but easy to arouse. Ace bandage c/d/I on R AKA. R femoral pulse palpable.

## 2021-05-12 NOTE — Progress Notes (Signed)
[facility] [place] PROGRESS NOTE  ASSESSMENT / PLAN: Ryan [name] is a 75 y.o. male with non-salvageable right lower extremity. R AKA with Dr. Scot Dock today.   SUBJECTIVE: No complaints.  OBJECTIVE: BP 140/70 (BP Location: Left Arm)   Pulse 75   Temp 97.7 F (36.5 C) (Oral)   Resp 20   Ht 6' (1.829 m)   Wt 84.8 kg   SpO2 97%   BMI 25.36 kg/m   Intake/Output Summary (Last 24 hours) at 05/12/2021 0813 Last data filed at 05/12/2021 0428 Gross per 24 hour  Intake 1505.3 ml  Output 630 ml  Net 875.3 ml     No distress Unlabored breathing Unchanged appearance of RLE  CBC Latest Ref Rng & Units 05/12/2021 05/11/2021 05/10/2021  WBC 4.0 - 10.5 K/uL 10.2 10.2 11.7(H)  Hemoglobin 13.0 - 17.0 g/dL 9.7(L) 9.7(L) 9.6(L)  Hematocrit 39.0 - 52.0 % 31.8(L) 32.0(L) 32.2(L)  Platelets 150 - 400 K/uL 141(L) 192 217     CMP Latest Ref Rng & Units 05/10/2021 05/08/2021 05/06/2021  Glucose 70 - 99 mg/dL 166(H) 106(H) 137(H)  BUN 8 - 23 mg/dL 45(H) 50(H) 59(H)  Creatinine 0.61 - 1.24 mg/dL 1.13 1.29(H) 1.57(H)  Sodium 135 - 145 mmol/L 141 141 143  Potassium 3.5 - 5.1 mmol/L 4.6 4.3 4.1  Chloride 98 - 111 mmol/L 111 111 111  CO2 22 - 32 mmol/L '24 24 23  '$ Calcium 8.9 - 10.3 mg/dL 8.7(L) 8.6(L) 8.4(L)  Total Protein 6.5 - 8.1 g/dL - - -  Total Bilirubin 0.3 - 1.2 mg/dL - - -  Alkaline Phos 38 - 126 U/L - - -  AST 15 - 41 U/L - - -  ALT 0 - 44 U/L - - -    Estimated Creatinine Clearance: 60.1 mL/min (by C-G formula based on SCr of 1.13 mg/dL).  Ryan [name]. [name], MD [facility] Phone Number: [phone] 05/12/2021 8:13 AM

## 2021-05-12 NOTE — Progress Notes (Signed)
Nutrition Follow Up  DOCUMENTATION CODES:   Not applicable  INTERVENTION:   Increase tube feeding via Cortrak:  -Osmolite 1.5 @ 75 ml/hr x 14 hours (1800-0800) -ProSource TF 45 ml QID  Provides: 1735 kcals, 109 grams protein, 800 ml free water. Meets 79% kcal needs and 100% of protein needs.   Continue regular diet with 1800 ml fluid restriction  Continue Ensure Enlive po BID, each supplement provides 350 kcal and 20 grams of protein Continue MVI with minerals daily   NUTRITION DIAGNOSIS:   Increased nutrient needs related to post-op healing as evidenced by estimated needs.  Ongoing  GOAL:   Patient will meet greater than or equal to 90% of their needs  Addressed via TF  MONITOR:   PO intake, Supplement acceptance, Diet advancement, Labs, Weight trends, Skin, I & O's  REASON FOR ASSESSMENT:   Consult Assessment of nutrition requirement/status, Wound healing  ASSESSMENT:   Ryan [name] is a 75 y.o. male with medical history significant of afib; MVR; chronic systolic CHF with AICD in place; DM; HTN; and PVD presenting for foot debridement/possible BKA.    He has had difficulty with a heel ulcer, not healing up well.  He was coming in today for surgery to have it cleaned up, possible amputation.  Unable to do the surgery because his INR was too high.  He takes Coumadin for afib.  Some days he feels ok and some he doesn't.  He has not feeling like he had a fever. Pt admitted rt foot wounds with critical limb ischemia.   7/21- s/p first/second R toe amputations, R heel excisional debridement, VAC placement 7/25- s/p LLE angiography 8/01- s/p nephrostomy tube, start iHD for AKI 8/03- underwent iHD 8/10- Cortrak placed, TF started   Underwent R AKA this afternoon. Remains drowsy from surgery.   RN reports intake has not progressed over the last week. Family bringing food from home but patient only accepts a few bites. Taking bites from meal trays. States he does not  have appetite.   Has been receiving Osmolite 1.5 @ 65 ml x 16 hours. Will decrease the amount of time on pump to allow for increased PO during the day. Order additional ProSource to maximize protein and promote wound healing. If intake continues to be inadequate, recommend GOC discussion to address long term feeding tube as patient is day 20 without adequate PO intake.   Admission weight: 73.1 kg  Current weight: 84.8 kg  UOP: 630 ml x 24 hrs   Medications: 500 mg Vitamin C, colace, SS novolog, zinc sulfate 220 mg  Labs: CBG 53-132   Diet Order:   Diet Order     None       EDUCATION NEEDS:   Education needs have been addressed  Skin:  Skin Assessment: Skin Integrity Issues: Skin Integrity Issues:: Other (Comment) Other: L heel and L flank  Stage II: medial coccyx Incision: s/p R AKA  Last BM:  8/23  Height:   Ht Readings from Last 1 Encounters:  05/10/21 6' (1.829 m)    Weight:   Wt Readings from Last 1 Encounters:  05/12/21 84.8 kg    Ideal Body Weight:  80.9 kg  BMI:  Body mass index is 25.36 kg/m.  Estimated Nutritional Needs:   Kcal:  2200-2400  Protein:  105-120 grams  Fluid:  > 2 L  Len Kluver MS, RD, LDN, CNSC Clinical Nutrition Pager listed in [place]

## 2021-05-12 NOTE — Transfer of Care (Signed)
Immediate Anesthesia Transfer of Care Note  Patient: Ryan [name]  Procedure(s) Performed: AMPUTATION ABOVE KNEE RIGHT (Right: Knee)  Patient Location: PACU  Anesthesia Type:General  Level of Consciousness: drowsy and patient cooperative  Airway & Oxygen Therapy: Patient Spontanous Breathing and Patient connected to face mask oxygen  Post-op Assessment: Report given to RN and Post -op Vital signs reviewed and stable  Post vital signs: Reviewed and stable  Last Vitals:  Vitals Value Taken Time  BP 144/83 05/12/21 1216  Temp    Pulse 82 05/12/21 1220  Resp 17 05/12/21 1220  SpO2 100 % 05/12/21 1220  Vitals shown include unvalidated device data.  Last Pain:  Vitals:   05/12/21 0809  TempSrc: Oral  PainSc:       Patients Stated Pain Goal: 2 ([id number])  Complications: No notable events documented.

## 2021-05-12 NOTE — Progress Notes (Signed)
SLP Cancellation Note  Patient Details Name: Ryan [name] MRN: [id number] DOB: 1946/01/06   Cancelled treatment:       Reason Eval/Treat Not Completed: Patient at procedure or test/unavailable. Pt for AKA today. Will f/u   Ryan [name], Katherene Ponto 05/12/2021, 8:46 AM

## 2021-05-12 NOTE — H&P (View-Only) (Signed)
[facility] [place] PROGRESS NOTE  ASSESSMENT / PLAN: TAHMIR ZUMBA is a 75 y.o. male with non-salvageable right lower extremity. R AKA with Dr. Scot Dock today.   SUBJECTIVE: No complaints.  OBJECTIVE: BP 140/70 (BP Location: Left Arm)   Pulse 75   Temp 97.7 F (36.5 C) (Oral)   Resp 20   Ht 6' (1.829 m)   Wt 84.8 kg   SpO2 97%   BMI 25.36 kg/m   Intake/Output Summary (Last 24 hours) at 05/12/2021 0813 Last data filed at 05/12/2021 0428 Gross per 24 hour  Intake 1505.3 ml  Output 630 ml  Net 875.3 ml     No distress Unlabored breathing Unchanged appearance of RLE  CBC Latest Ref Rng & Units 05/12/2021 05/11/2021 05/10/2021  WBC 4.0 - 10.5 K/uL 10.2 10.2 11.7(H)  Hemoglobin 13.0 - 17.0 g/dL 9.7(L) 9.7(L) 9.6(L)  Hematocrit 39.0 - 52.0 % 31.8(L) 32.0(L) 32.2(L)  Platelets 150 - 400 K/uL 141(L) 192 217     CMP Latest Ref Rng & Units 05/10/2021 05/08/2021 05/06/2021  Glucose 70 - 99 mg/dL 166(H) 106(H) 137(H)  BUN 8 - 23 mg/dL 45(H) 50(H) 59(H)  Creatinine 0.61 - 1.24 mg/dL 1.13 1.29(H) 1.57(H)  Sodium 135 - 145 mmol/L 141 141 143  Potassium 3.5 - 5.1 mmol/L 4.6 4.3 4.1  Chloride 98 - 111 mmol/L 111 111 111  CO2 22 - 32 mmol/L '24 24 23  '$ Calcium 8.9 - 10.3 mg/dL 8.7(L) 8.6(L) 8.4(L)  Total Protein 6.5 - 8.1 g/dL - - -  Total Bilirubin 0.3 - 1.2 mg/dL - - -  Alkaline Phos 38 - 126 U/L - - -  AST 15 - 41 U/L - - -  ALT 0 - 44 U/L - - -    Estimated Creatinine Clearance: 60.1 mL/min (by C-G formula based on SCr of 1.13 mg/dL).  [name]. [name], MD [facility] Phone Number: [phone] 05/12/2021 8:13 AM

## 2021-05-12 NOTE — Op Note (Signed)
    NAME: Ryan [name]    MRN: [id number] DOB: 1945-11-10    DATE OF OPERATION: 05/12/2021  PREOP DIAGNOSIS:    Ischemic right lower extremity with gangrene  POSTOP DIAGNOSIS:    Same  PROCEDURE:    Right above-the-knee amputation  SURGEON: Judeth Cornfield. Scot Dock, MD  ASSIST: Risa Grill PA  ANESTHESIA: General  EBL: 100 cc  INDICATIONS:    [name] is a 75 y.o. male who presented with a nonsalvageable right lower extremity.  He presents for right above-knee amputation.  FINDINGS:   The muscle appeared well perfused although there was one muscle belly medially which was necrotic.  TECHNIQUE:   The patient was taken to the operating room and received a general anesthetic.  The right lower extremity was prepped and draped in usual sterile fashion.  I placed a tourniquet on the upper thigh.  A fishmouth incision was marked above the level of patella.  The leg was exsanguinated with an Esmarch bandage.  Incision was then carried down to the skin and subcutaneous tissue however the tourniquet was not effective as the patient had severe calcific disease.  I therefore did the skin incision with a knife and the remainder of the distal incisions with the electrocautery.  As the arteries and veins were individually identified they were clamped and divided and ligated with 2-0 silk ties.  Next I elevated the periosteum and divided the bone proximal to the level of skin division.  The leg was removed.  Additional hemostasis was obtained using electrocautery and 2-0 silk ties.  The tourniquet was then released.  The edges of the bone were rasped.  Additional hemostasis was obtained.  The fascial layer was then closed with interrupted 2-0 Vicryl's.  The skin was closed with staples.  A sterile dressing was applied.  The patient tolerated the procedure well was transferred to recovery room in stable condition.  All needle and sponge counts were correct.  Given the complexity of the  case a first assistant was necessary in order to expedient the procedure and safely perform the technical aspects of the operation.  Deitra Mayo, MD, FACS [facility]  DATE OF DICTATION:   05/12/2021

## 2021-05-12 NOTE — Consult Note (Addendum)
[place] Nurse wound follow up Pt is going to OR today for AKA performed by the vascular team; please refer to their team for further questions regarding plan of care.  Coccyx with a Stage 2 pressure injury and topical treatment orders have been provided for bedside nurses to perform to protect and promote healing. Please re-consult if further assistance is needed.  Thank-you,  Julien Girt MSN, [place], [place], [place], [place]

## 2021-05-12 NOTE — Progress Notes (Addendum)
ANTICOAGULATION CONSULT NOTE - Follow Up Consult  Pharmacy Consult for Heparin Indication: pulmonary embolus and DVT  Allergies  Allergen Reactions   Tuna [Fish Allergy] Nausea And Vomiting    Patient Measurements: Height: 6' (182.9 cm) Weight: 84.8 kg (187 lb) IBW/kg (Calculated) : 77.6 Heparin Dosing Weight: 84.8 kg  Vital Signs: Temp: 97.7 F (36.5 C) (08/23 0419) Temp Source: Oral (08/23 0419) BP: 140/70 (08/23 0419) Pulse Rate: 75 (08/23 0419)  Labs: Recent Labs    05/10/21 0407 05/11/21 0430 05/12/21 0430  HGB 9.6* 9.7* 9.7*  HCT 32.2* 32.0* 31.8*  PLT 217 192 141*  HEPARINUNFRC 0.49 0.50 0.40  CREATININE 1.13  --   --      Estimated Creatinine Clearance: 60.1 mL/min (by C-G formula based on SCr of 1.13 mg/dL).  Assessment: 75 yr old man admitted on 04/08/21 with critical limb ischemic of RLE.  Pt with permanent atrial fibrillation, also with mitral valve replacement with bioprosthetic valve (on warfarin PTA for afib, on hold). Also recent dx of COVID infection. DVT prophylaxis had been on hold due to recent retroperitoneal hematoma, for which he was given vit K and FFP (repeat CT on 8/5 remained unchanged). Now with acute PE and bilateral DVTs 8/16-8/17, Pharmacy was consulted for IV heparin without boluses.   Heparin level remains therapeutic (0.40) on 1750 units/hr. Mid-range therapeutic. No bleeding reported.  Platelet count trended down to 141.   R AKA previously planned for 8/22, but postponed until 8/23.  Goal of Therapy:  Heparin level 0.3-0.5 units/ml (lower end of therapeutic range due to recent hematoma) Monitor platelets by anticoagulation protocol: Yes   Plan:  Continue heparin drip at 1750 units/hr. Daily heparin level and CBC. Monitor for signs/symptoms of bleeding. Expect heparin drip to be held for surgery. Will follow up post-operative for resuming heparin.  Arty Baumgartner, RPh 05/12/2021,8:38 AM  Addendum:   Heparin drip to  resume ~6 hours post-op tonight (6:15pm)   Next heparin level and CBC in am.  T.Evelynne Spiers, RPh 05/12/2021 4:14 PM

## 2021-05-12 NOTE — Anesthesia Preprocedure Evaluation (Addendum)
Anesthesia Evaluation  Patient identified by MRN, date of birth, ID band Patient awake    Reviewed: Allergy & Precautions, H&P , NPO status , Patient's Chart, lab work & pertinent test results  Airway Mallampati: II  TM Distance: >3 FB Neck ROM: Full    Dental no notable dental hx. (+) Teeth Intact, Dental Advisory Given   Pulmonary neg pulmonary ROS,    Pulmonary exam normal breath sounds clear to auscultation       Cardiovascular hypertension, Pt. on medications + Peripheral Vascular Disease and +CHF  + dysrhythmias Atrial Fibrillation + Cardiac Defibrillator  Rhythm:Regular Rate:Normal     Neuro/Psych CVA negative psych ROS   GI/Hepatic negative GI ROS, Neg liver ROS,   Endo/Other  diabetes  Renal/GU Renal disease  negative genitourinary   Musculoskeletal  (+) Arthritis , Osteoarthritis,    Abdominal   Peds  Hematology negative hematology ROS (+)   Anesthesia Other Findings   Reproductive/Obstetrics negative OB ROS                            Anesthesia Physical Anesthesia Plan  ASA: 4  Anesthesia Plan: General   Post-op Pain Management:    Induction: Intravenous  PONV Risk Score and Plan: 3 and Ondansetron, Dexamethasone and Treatment may vary due to age or medical condition  Airway Management Planned: Oral ETT  Additional Equipment:   Intra-op Plan:   Post-operative Plan: Extubation in OR  Informed Consent: I have reviewed the patients History and Physical, chart, labs and discussed the procedure including the risks, benefits and alternatives for the proposed anesthesia with the patient or authorized representative who has indicated his/her understanding and acceptance.     Dental advisory given  Plan Discussed with: CRNA  Anesthesia Plan Comments:         Anesthesia Quick Evaluation

## 2021-05-12 NOTE — Interval H&P Note (Signed)
History and Physical Interval Note:  05/12/2021 9:43 AM  Ryan [name]  has presented today for surgery, with the diagnosis of PAD.  The various methods of treatment have been discussed with the patient and family. After consideration of risks, benefits and other options for treatment, the patient has consented to  Procedure(s): AMPUTATION ABOVE KNEE RIGHT (Right) as a surgical intervention.  The patient's history has been reviewed, patient examined, no change in status, stable for surgery.  I have reviewed the patient's chart and labs.  Questions were answered to the patient's satisfaction.     Deitra Mayo

## 2021-05-12 NOTE — Progress Notes (Signed)
PT Cancellation Note  Patient Details Name: Ryan [name] MRN: [id number] DOB: August 30, 1946   Cancelled Treatment:    Reason Eval/Treat Not Completed: (P) Patient at procedure or test/unavailable Pt is off floor for R AKA. PT will follow back for treatment tomorrow.  Consepcion Utt B. Migdalia Dk PT, DPT Acute Rehabilitation Services Pager [phone] Office [phone]    [place] 05/12/2021, 12:48 PM

## 2021-05-12 NOTE — Anesthesia Procedure Notes (Signed)
Arterial Line Insertion Start/End8/23/2022 10:56 AM, 05/12/2021 11:01 AM Performed by: Roderic Palau, MD  Patient location: OR. Preanesthetic checklist: patient identified, IV checked, site marked, risks and benefits discussed, surgical consent, monitors and equipment checked, pre-op evaluation, timeout performed and anesthesia consent Lidocaine 1% used for infiltration Left, radial was placed Catheter size: 20 G Hand hygiene performed , maximum sterile barriers used  and Seldinger technique used  Attempts: 1 Procedure performed without using ultrasound guided technique. Following insertion, dressing applied and Biopatch. Post procedure assessment: normal and unchanged  Patient tolerated the procedure well with no immediate complications.

## 2021-05-12 NOTE — Progress Notes (Signed)
PROGRESS NOTE    Ryan [name]  [id number] DOB: 10-23-1945 DOA: 04/08/2021 PCP: Ryan Nakai, MD   Brief Narrative: Ryan [name] is a 75 y.o. male with a history of atrial fibrillation and mitral valve replacement on Coumadin, chronic systolic heart failure status post AICD, primary hypertension, peripheral vascular disease.  Patient presented secondary to right foot wound secondary to critical limb ischemia.  Vascular surgery was consulted.  Patient underwent partial right foot amputation with excisional debridement and wound VAC placement.  Patient continued to have worsening evidence of limb ischemia with vascular surgery now recommending above knee amputation.  During admission, patient developed significant AKI in setting of left hydronephrosis and likely ATN from hypotension.  Patient required hemodialysis transiently but this has been discontinued.   Assessment & Plan:   Principal Problem:   Critical lower limb ischemia (HCC) Active Problems:   Chronic systolic heart failure (HCC)   Status post mitral valve replacement with bioprosthetic valve   Diabetes mellitus ([place])   Essential hypertension   ICD (implantable cardioverter-defibrillator) in place   Stage 3 chronic kidney disease (HCC)   Pressure injury of skin   Acute respiratory failure with hypoxia Secondary to multiple issues including acute PE, pneumonia.  Patient required significantly elevated oxygen delivery up to 25 L/min on heated high flow nasal cannula and is now been weaned to about 2 L/min. Breathing better after Lasix IV x1 on 8/19 -Wean oxygen to room air as able  Acute pulmonary embolism Acute LUE DVT In setting of COVID infection, discontinuation of Coumadin secondary to retroperitoneal hematoma/intramuscular hemorrhage in addition to prolonged hospitalization.  Diagnosed on 8/16 via perfusion scan which was consistent with right upper and right lower lobe PE.  Heparin IV restarted after discussion  with vascular surgery. -Continue heparin IV  Possible thrush May be contributing to pain with swallowing.  Does not appear to have esophageal symptoms. Symptoms somewhat improved but may be related to cough more than possible thrush. Still with some lesions. -Nystatin solution QID x10-14 days at least based on response  Possible aspiration Initial concern for cause of hypoxia prior to diagnosis of PE.  With diagnosis of PE in addition to multifocal pneumonia from COVID-19, does not appear that aspiration pneumonia is likely.  Patient was treated empirically with Zosyn and vancomycin which have been discontinued.  Speech therapy was consulted and recommend regular diet with thin liquids.  COVID-19 pneumonia Multifocal pneumonia Resolved. Patient treated with a complete 5-day course remdesivir.  Currently on Decadron secondary to associated hypoxia. -Continue Decadron to complete 10-day course -Tessalon perles for cough -Add Tussionex qhs prn  Poor oral intake CorTrak placed. Dietitian consulted. Patient appears to have difficulty with eating secondary to pain with swallowing. Currently on tube feeds. Clear for diet from a speech therapy standpoint. -Continue tube feeds with plan to wean off and continue oral intake  Leukocytosis In setting of infection and steroid use.  Trending down.  Right critical lower limb ischemia Vascular surgery on board.  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  In setting of Coumadin use as an outpatient.  At that time, he was given vitamin K and FFP for  therapeutic INR.  Repeat CT abdomen pelvis on 8/5 significant for unchanged  hematoma.  Patient now restarted on anticoagulation for acute DVT and PEs. -Trend hemoglobin with daily CBC  AKI on CKD stage IIIb Likely from ATN from hypotension/retroperitoneal bleed with resultant displacement of left kidney with resultant hydronephrosis.  Patient is status post left percutaneous nephrostomy tube placement on 8/1.  Peak creatinine of 5.63.  Nephrology was consulted and patient was started on HD transiently with significant provement of renal function.  AKI now currently resolved and creatinine back to baseline.  Temporary dialysis catheter removed.   Hydronephrosis As mentioned above.  Percutaneous nephrostomy tube placed on 8/1 with recommendation to keep in place for at least 2 months in addition to outpatient urology follow-up.  Chronic systolic heart failure Stable.  Permanent atrial fibrillation Status post mitral valve replacement with bioprosthetic valve Currently on heparin for anticoagulation.  Also on amiodarone. -Continue amiodarone and heparin IV  Diabetes mellitus type 2 Well-controlled with hemoglobin A1c of 5.4%.  Primary pretension He has been borderline hypotensive this admission.  No antihypertensives.  Hyperlipidemia -Continue Lipitor  Pressure injury Medial coccyx, unknown if present on admission.   DVT prophylaxis: Heparin IV Code Status:   Code Status: Full Code Family Communication: Sister at bedside. Disposition Plan: Discharge likely to SNF in several days pending vascular surgery recommendations/management, stability of hemoglobin, transition to oral medications including anticoagulation, removal of NG tube and improvement of oral intake   Consultants:  Vascular surgery  Procedures:    Antimicrobials: Ceftriaxone IV Metronidazole IV Remdesivir IV Vancomycin IV   Subjective: Continues to have some coughing. Eating outside food better than hospital food.  Objective: Vitals:   05/12/21 1311 05/12/21 1405 05/12/21 1415  05/12/21 1430  BP: (!) 154/72 140/69 (!) 150/68 (!) 150/93  Pulse: 81 68 66 65  Resp: 16 16    Temp: 98.3 F (36.8 C) 97.6 F (36.4 C)    TempSrc: Oral Oral    SpO2: 100% 97% 99% 99%  Weight:      Height:        Intake/Output Summary (Last 24 hours) at 05/12/2021 1504 Last data filed at 05/12/2021 1221 Gross per 24 hour  Intake 2055.3 ml  Output 930 ml  Net 1125.3 ml    Filed Weights   05/10/21 0323 05/10/21 0338 05/12/21 0446  Weight: 83.9 kg 83.9 kg 84.8 kg    Examination:  General exam: Appears calm and comfortable Respiratory system: Clear to anterior auscultation. Respiratory effort normal. Cardiovascular system: S1 & S2 heard, RRR. No murmurs, rubs, gallops or clicks. Gastrointestinal system: Abdomen is nondistended, soft and nontender. No organomegaly or masses felt. Normal bowel sounds heard. Central nervous system: Alert and oriented. No focal neurological deficits. Musculoskeletal: LUE edema is significantly less but still about 1+. No calf tenderness Skin: No cyanosis. No rashes Psychiatry: Judgement and insight appear normal. Mood & affect appropriate.   Data Reviewed: I have personally reviewed following labs and imaging studies  CBC Lab Results  Component Value Date   WBC 10.2 05/12/2021   RBC 3.91 (L) 05/12/2021   HGB 9.7 (L) 05/12/2021   HCT 31.8 (L) 05/12/2021   MCV 81.3 05/12/2021   MCH 24.8 (L) 05/12/2021   PLT 141 (L) 05/12/2021   MCHC 30.5 05/12/2021   RDW 25.0 (H) 05/12/2021   LYMPHSABS 1,363 06/01/2016   MONOABS 517 06/01/2016   EOSABS 141 06/01/2016   BASOSABS 47 [id number]     Last metabolic panel Lab Results  Component Value Date   NA 141 05/10/2021   K 4.6 05/10/2021   CL 111 05/10/2021   CO2 24 05/10/2021   BUN 45 (H) 05/10/2021   CREATININE 1.13 05/10/2021   GLUCOSE 166 (H) 05/10/2021   GFRNONAA >60 05/10/2021   GFRAA 39 (L) 10/01/2020   CALCIUM 8.7 (L) 05/10/2021   PHOS 3.3 04/30/2021   PROT 5.8 (L) 05/04/2021    ALBUMIN 1.9 (L) 05/04/2021   LABGLOB 2.2 03/09/2021   BILITOT 1.1 05/04/2021   ALKPHOS 89 05/04/2021   AST 41 05/04/2021   ALT 29 05/04/2021   ANIONGAP 6 05/10/2021    CBG (last 3)  Recent Labs    05/12/21 1217 05/12/21 1241 05/12/21 1357  GLUCAP 71 132* 91      GFR: Estimated Creatinine Clearance: 60.1 mL/min (by C-G formula based on SCr of 1.13 mg/dL).  Coagulation Profile: No results for input(s): INR, PROTIME in the last 168 hours.   Recent Results (from the past 240 hour(s))  MRSA Next Gen by PCR, Nasal     Status: Abnormal   Collection Time: 05/03/21 12:18 AM   Specimen: Nasal Mucosa; Nasal Swab  Result Value Ref Range Status   MRSA by PCR Next Gen POSITIVE (A) NOT DETECTED Final    Comment: CRITICAL RESULT CALLED TO, READ BACK BY AND VERIFIED WITH: RN Corie Chiquito [phone] '@[id number]'$  THANEY Performed at [facility], [place] [name] [place], [place] [address]          Radiology Studies: DG CHEST PORT 1 VIEW  Result Date: 05/11/2021 CLINICAL DATA:  Cough, COVID-19 positive. EXAM: PORTABLE CHEST 1 VIEW COMPARISON:  May 08, 2021. FINDINGS: Stable cardiomegaly. Feeding tube is seen entering stomach. Left-sided pacemaker is unchanged in position. Status post cardiac valve repair. Right-sided PICC line is unchanged. No pneumothorax is noted. Stable bilateral lung opacities are noted concerning for edema or pneumonia with small bilateral pleural effusions. Bony thorax is unremarkable. IMPRESSION: Stable support apparatus. Stable bilateral lung opacities are noted. Aortic Atherosclerosis (ICD10-I70.0). Electronically Signed   By: Marijo Conception M.D.   On: 05/11/2021 11:57        Scheduled Meds:  amiodarone  200 mg Oral Daily   vitamin C  500 mg Oral Daily   atorvastatin  40 mg Oral q1800   benzonatate  100 mg Oral TID   Chlorhexidine Gluconate Cloth  6 each Topical Daily   dextromethorphan-guaiFENesin  1 tablet Oral BID   docusate sodium  100 mg Oral BID    feeding supplement  237 mL Oral BID BM   feeding supplement (OSMOLITE 1.5 CAL)  1,000 mL Per Tube Q24H   feeding supplement (PROSource TF)  45 mL Per Tube TID   insulin aspart  0-15 Units Subcutaneous Q4H   midodrine  10 mg Oral TID WC   multivitamin with minerals  1 tablet Oral Daily   mupirocin ointment  1 application Nasal BID   nystatin  5 mL Oral QID   pantoprazole  40 mg Oral Daily   sodium chloride flush  10-40 mL Intracatheter Q12H   sodium chloride flush  3 mL Intravenous Q12H   zinc sulfate  220 mg Oral Daily   Continuous Infusions:  heparin       LOS: 34 days     Cordelia Poche, MD [facility] 05/12/2021, 3:04 PM  If 7PM-7AM, please contact night-coverage [email or web address]

## 2021-05-12 NOTE — Anesthesia Postprocedure Evaluation (Signed)
Anesthesia Post Note  Patient: Ryan [name]  Procedure(s) Performed: AMPUTATION ABOVE KNEE RIGHT (Right: Knee)     Patient location during evaluation: PACU Anesthesia Type: General Level of consciousness: awake and alert Pain management: pain level controlled Vital Signs Assessment: post-procedure vital signs reviewed and stable Respiratory status: spontaneous breathing, nonlabored ventilation, respiratory function stable and patient connected to nasal cannula oxygen Cardiovascular status: blood pressure returned to baseline and stable Postop Assessment: no apparent nausea or vomiting Anesthetic complications: no   No notable events documented.  Last Vitals:  Vitals:   05/12/21 1415 05/12/21 1430  BP: (!) 150/68 (!) 150/93  Pulse: 66 65  Resp:    Temp:    SpO2: 99% 99%    Last Pain:  Vitals:   05/12/21 1405  TempSrc: Oral  PainSc:    Pain Goal: Patients Stated Pain Goal: 4 (05/12/21 1235)                 Barnet Glasgow

## 2021-05-13 ENCOUNTER — Encounter (HOSPITAL_COMMUNITY): Payer: Self-pay | Admitting: Vascular Surgery

## 2021-05-13 DIAGNOSIS — J9601 Acute respiratory failure with hypoxia: Secondary | ICD-10-CM

## 2021-05-13 DIAGNOSIS — I5022 Chronic systolic (congestive) heart failure: Secondary | ICD-10-CM | POA: Diagnosis not present

## 2021-05-13 DIAGNOSIS — I70229 Atherosclerosis of native arteries of extremities with rest pain, unspecified extremity: Secondary | ICD-10-CM | POA: Diagnosis not present

## 2021-05-13 DIAGNOSIS — I10 Essential (primary) hypertension: Secondary | ICD-10-CM | POA: Diagnosis not present

## 2021-05-13 LAB — CBC
HCT: 28 % — ABNORMAL LOW (ref 39.0–52.0)
Hemoglobin: 8.6 g/dL — ABNORMAL LOW (ref 13.0–17.0)
MCH: 25.3 pg — ABNORMAL LOW (ref 26.0–34.0)
MCHC: 30.7 g/dL (ref 30.0–36.0)
MCV: 82.4 fL (ref 80.0–100.0)
Platelets: 110 10*3/uL — ABNORMAL LOW (ref 150–400)
RBC: 3.4 MIL/uL — ABNORMAL LOW (ref 4.22–5.81)
RDW: 24.9 % — ABNORMAL HIGH (ref 11.5–15.5)
WBC: 13.6 10*3/uL — ABNORMAL HIGH (ref 4.0–10.5)
nRBC: 0 % (ref 0.0–0.2)

## 2021-05-13 LAB — HEPARIN LEVEL (UNFRACTIONATED): Heparin Unfractionated: 0.44 IU/mL (ref 0.30–0.70)

## 2021-05-13 LAB — BASIC METABOLIC PANEL
Anion gap: 6 (ref 5–15)
BUN: 31 mg/dL — ABNORMAL HIGH (ref 8–23)
CO2: 23 mmol/L (ref 22–32)
Calcium: 8.2 mg/dL — ABNORMAL LOW (ref 8.9–10.3)
Chloride: 112 mmol/L — ABNORMAL HIGH (ref 98–111)
Creatinine, Ser: 1.23 mg/dL (ref 0.61–1.24)
GFR, Estimated: >60 mL/min (ref >60)
Glucose, Bld: 98 mg/dL (ref 70–99)
Potassium: 4.6 mmol/L (ref 3.5–5.1)
Sodium: 141 mmol/L (ref 135–145)

## 2021-05-13 LAB — GLUCOSE, CAPILLARY
Glucose-Capillary: 100 mg/dL — ABNORMAL HIGH (ref 70–99)
Glucose-Capillary: 130 mg/dL — ABNORMAL HIGH (ref 70–99)
Glucose-Capillary: 149 mg/dL — ABNORMAL HIGH (ref 70–99)
Glucose-Capillary: 156 mg/dL — ABNORMAL HIGH (ref 70–99)
Glucose-Capillary: 157 mg/dL — ABNORMAL HIGH (ref 70–99)
Glucose-Capillary: 88 mg/dL (ref 70–99)

## 2021-05-13 NOTE — Progress Notes (Signed)
Physical Therapy Re-Evaluation Patient Details Name: COWEN JEWART MRN: [id number] DOB: 1946-07-28 Today's Date: 05/13/2021   History of Present Illness  75 y.o. male admitted 04/08/21 for R foot debridement and now R AK amp on 8/23.  R IJ temp HD catheter placement on 8/1.  Pt with L sided hydronephrosis possibly secondary to L retroperitoneal hematoma, s/p nephrostomy tube on 8/1 to drain. Now on airborne/contact precautions, positive Covid-19 test collected 8/9.  PMH: Afib on Coumadin mitral valve replacement, chronic systolic heart failure with an AICD and placed, essential HTN, PVD,  peripheral vascular bypass  Clinical Impression  Pt was seen for new assessment of mobility after new AK amputation, demonstrating some struggle with strength and pain to perform bed mobility.  Pt is up to side partially and cannot fully sit up, as well as cannot tolerate due to being light headed.  Will work toward getting him upright and may require two person help due to his weakness since surgery.  Follow along with acute PT goals, and consider sliding transfers as main plan.    Follow Up Recommendations SNF    Equipment Recommendations  Wheelchair (measurements PT);Wheelchair cushion (measurements PT)    Recommendations for Other Services       Precautions / Restrictions Precautions Precautions: Fall Precaution Comments: Airborne/Contact precs, L nephrostomy tube/drain, cortrak Required Braces or Orthoses: Other Brace Restrictions Weight Bearing Restrictions: Yes RLE Weight Bearing: Non weight bearing LLE Weight Bearing: Weight bearing as tolerated      Mobility  Bed Mobility Overal bed mobility: Needs Assistance Bed Mobility: Supine to Sit;Sit to Supine   Sidelying to sit: Max assist Supine to sit: Max assist Sit to supine: Max assist   General bed mobility comments: pt did not fully sit up and used only the R elbow to support sitting balance    Transfers Overall transfer level:  Needs assistance               General transfer comment: unable to get up to move due to light headed feeling and had to return to bed  Ambulation/Gait             General Gait Details: unable  Stairs            Wheelchair Mobility    Modified Rankin (Stroke Patients Only)       Balance Overall balance assessment: Needs assistance Sitting-balance support: Feet supported Sitting balance-Leahy Scale: Poor                                       Pertinent Vitals/Pain Pain Assessment: 0-10 Pain Score: 7  Pain Location: R ak amp Pain Descriptors / Indicators: Operative site guarding;Guarding;Grimacing Pain Intervention(s): Limited activity within patient's tolerance;Monitored during session;Repositioned;Patient requesting pain meds-RN notified    Home Living Family/patient expects to be discharged to:: Skilled nursing facility                      Prior Function Level of Independence: Independent with assistive device(s)               Hand Dominance   Dominant Hand: Right    Extremity/Trunk Assessment   Upper Extremity Assessment Upper Extremity Assessment: Generalized weakness    Lower Extremity Assessment Lower Extremity Assessment: Generalized weakness;RLE deficits/detail RLE Deficits / Details: new R AK amp with pain and weakness RLE Coordination: decreased gross motor  Cervical / Trunk Assessment Cervical / Trunk Assessment: Normal;Other exceptions (weak core)  Communication   Communication: No difficulties  Cognition Arousal/Alertness: Awake/alert Behavior During Therapy: Flat affect Overall Cognitive Status: No family/caregiver present to determine baseline cognitive functioning Area of Impairment: Following commands;Safety/judgement;Awareness;Problem solving;Orientation;Attention                 Orientation Level: Time;Situation Current Attention Level: Selective   Following Commands: Follows one  step commands inconsistently;Follows one step commands with increased time Safety/Judgement: Decreased awareness of safety Awareness: Intellectual Problem Solving: Slow processing;Decreased initiation;Requires verbal cues;Requires tactile cues General Comments: Pt is tired possibly from earlier effort, requires a lot of help and notes he is light headed after trying to sit on side of bed      General Comments General comments (skin integrity, edema, etc.): pt is up to side of bed with max assist to sit partially and was unable to control core to sit up fully.  Pt seems unfocused, and will work toward moving to chair as he is able    Exercises     Assessment/Plan    PT Assessment Patient needs continued PT services  PT Problem List Decreased strength;Decreased range of motion;Decreased activity tolerance;Decreased balance;Decreased mobility;Decreased coordination;Decreased cognition;Decreased skin integrity;Pain       PT Treatment Interventions DME instruction;Therapeutic exercise;Gait training;Balance training;Functional mobility training;Therapeutic activities;Patient/family education    PT Goals (Current goals can be found in the Care Plan section)  Acute Rehab PT Goals Patient Stated Goal: to hurt less PT Goal Formulation: With patient Time For Goal Achievement: 05/20/21 Potential to Achieve Goals: Fair    Frequency Min 2X/week   Barriers to discharge Inaccessible home environment;Decreased caregiver support      Co-evaluation               AM-PAC PT "6 Clicks" Mobility  Outcome Measure Help needed turning from your back to your side while in a flat bed without using bedrails?: A Lot Help needed moving from lying on your back to sitting on the side of a flat bed without using bedrails?: A Lot Help needed moving to and from a bed to a chair (including a wheelchair)?: Total Help needed standing up from a chair using your arms (e.g., wheelchair or bedside chair)?:  Total Help needed to walk in hospital room?: Total Help needed climbing 3-5 steps with a railing? : Total 6 Click Score: 8    End of Session Equipment Utilized During Treatment: Oxygen Activity Tolerance: Patient limited by fatigue;Patient limited by pain Patient left: in bed;with call bell/phone within reach;with bed alarm set Nurse Communication: Mobility status PT Visit Diagnosis: Other abnormalities of gait and mobility (R26.89);Muscle weakness (generalized) (M62.81);Pain Pain - Right/Left: Right Pain - part of body: Leg    Time: [id number] PT Time Calculation (min) (ACUTE ONLY): 19 min   Charges:   PT Evaluation $PT Eval Moderate Complexity: 1 Mod         Ramond Dial 05/13/2021, 1:09 PM  Mee Hives, PT MS Acute Rehab Dept. Number: [place]

## 2021-05-13 NOTE — Progress Notes (Signed)
ANTICOAGULATION CONSULT NOTE - Follow Up Consult  Pharmacy Consult for Heparin Indication: pulmonary embolus and DVT  Allergies  Allergen Reactions   Tuna [Fish Allergy] Nausea And Vomiting    Patient Measurements: Height: 6' (182.9 cm) Weight: 79.8 kg (176 lb) IBW/kg (Calculated) : 77.6 Heparin Dosing Weight: 84.8 kg  Vital Signs: Temp: 98.3 F (36.8 C) (08/24 0800) Temp Source: Oral (08/24 0800) BP: 101/60 (08/24 0800) Pulse Rate: 75 (08/24 0800)  Labs: Recent Labs    05/11/21 0430 05/12/21 0430 05/13/21 0444  HGB 9.7* 9.7* 8.6*  HCT 32.0* 31.8* 28.0*  PLT 192 141* 110*  HEPARINUNFRC 0.50 0.40 0.44  CREATININE  --   --  1.23     Estimated Creatinine Clearance: 55.2 mL/min (by C-G formula based on SCr of 1.23 mg/dL).  Assessment: 75 yr old man admitted on 04/08/21 with critical limb ischemic of RLE.  Pt with permanent atrial fibrillation, also with mitral valve replacement with bioprosthetic valve (on warfarin PTA for afib, on hold). Also recent dx of COVID infection. DVT prophylaxis had been on hold due to recent retroperitoneal hematoma, for which he was given vit K and FFP (repeat CT on 8/5 remained unchanged). Now with acute PE per VQ scan 8/16 and bilateral DVTs per duplex 8/15. Pharmacy was consulted for IV heparin without boluses on 8/15.    Heparin was held for R AKA on 8/23 then resumed ~6 hours post-op last night. Heparin level remains therapeutic (0.44) on 1750 units/hr. Trying to keep at lower end of therapeutic range. Platelet count trended down again to 110. 212K when IV heparin begun.  Day #9 IV heparin, POD#1. Hgb 9.7>8.6. No bleeding noted, VVS planning dressing change on 8/25.  Goal of Therapy:  Heparin level 0.3-0.5 units/ml (lower end of therapeutic range due to recent hematoma) Monitor platelets by anticoagulation protocol: Yes   Plan:  Continue heparin drip at 1750 units/hr. Daily heparin level and CBC. Platelet count dropping, may need to  check HIT antibody. Monitor for signs/symptoms of bleeding.  Arty Baumgartner, [place] 05/13/2021,10:03 AM

## 2021-05-13 NOTE — Progress Notes (Signed)
  Speech Language Pathology Treatment: Dysphagia  Patient Details Name: Ryan [name] MRN: [id number] DOB: [birth year] Today's Date: 05/13/2021 Time: 1100-1120 SLP Time Calculation (min) (ACUTE ONLY): 20 min  Assessment / Plan / Recommendation Clinical Impression  Pt demonstrates some difficulty with meal. He has prolonged mastication of solids, even after denture in place. Fatigue with mastication seems to limit the quantity of his intake. He is coughing frequently at baseline, but not immediately after PO. Pt is alert and self feeding despite his recent surgery. Recommend pt downgrade to dys 2 texture (fine chop) to facilitate ease of intake. Will f/u at least once to determine if pt is satisfied with the change.    HPI HPI: Patient is a 75  y.o. male with PMH: atrial fibrillation on Coumadin mitral valve replacement, chronic systolic heart failure with an AICD and placed, essential hypertension peripheral vascular disease, also history of peripheral vascular bypass with an ongoing wound infection initially hospitalized for foot debridement and possible BKA. He had been scheduled to have debridement on 04/08/2021 but due to an INR of 3.2 surgery was postponed.  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 He developed a retroperitoneal hematoma with left kidney displacement and hypotension developed ATN and hyperkalemia, nephrology was consulted recommended renal replacement therapy, urology was consulted due to his left hydronephrosis would like recommended a left nephrostomy tube.  Nephrostomy tube placed on 8/1 and central line placement for HD.  Patient did well after 1 HD treatment and renal function recovered.  During screening status for SNF placement, patient tested positive for COVID-19. Evening of 8/13, patient had significantly increased oxygen requirement and in AM of 8/14 was on  25L/min 100% FiO2 HFNC plus nonrebreather. Cortrak in place to supplement nutrition. SLP ordered to assess swallow function due to patient reporting difficulty swallowing at times as well as endorsing a productive cough.      SLP Plan  Continue with current plan of care       Recommendations  Diet recommendations: Dysphagia 2 (fine chop);Thin liquid Liquids provided via: Straw Medication Administration: Whole meds with liquid Supervision: Patient able to self feed;Intermittent supervision to cue for compensatory strategies Compensations: Minimize environmental distractions;Slow rate;Small sips/bites Postural Changes and/or Swallow Maneuvers: Seated upright 90 degrees                Oral Care Recommendations: Oral care BID Follow up Recommendations: None SLP Visit Diagnosis: Dysphagia, unspecified (R13.10) Plan: Continue with current plan of care       GO               Ryan Baltimore, MA [place] Pager [phone] Office [phone]  [name] 05/13/2021, 1:00 PM

## 2021-05-13 NOTE — Progress Notes (Addendum)
  Progress Note    05/13/2021 7:56 AM 1 Day Post-Op  Subjective:  right aka tenderness   Vitals:   05/13/21 0600 05/13/21 0603  BP: (!) 95/53 100/61  Pulse: 77   Resp:    Temp:    SpO2: 96%    Physical Exam: Cardiac:  regular Lungs:  non labored Incisions:  right AKA dressings c/d/i Extremities:  well perfused Neurologic: alert and oriented  CBC    Component Value Date/Time   WBC 13.6 (H) 05/13/2021 0444   RBC 3.40 (L) 05/13/2021 0444   HGB 8.6 (L) 05/13/2021 0444   HGB 9.6 (L) 03/27/2021 1123   HCT 28.0 (L) 05/13/2021 0444   HCT 30.4 (L) 03/27/2021 1123   PLT 110 (L) 05/13/2021 0444   PLT 362 03/27/2021 1123   MCV 82.4 05/13/2021 0444   MCV 77 (L) 03/27/2021 1123   MCH 25.3 (L) 05/13/2021 0444   MCHC 30.7 05/13/2021 0444   RDW 24.9 (H) 05/13/2021 0444   RDW 17.2 (H) 03/27/2021 1123   LYMPHSABS 1,363 06/01/2016 1414   MONOABS 517 06/01/2016 1414   EOSABS 141 06/01/2016 1414   BASOSABS 47 06/01/2016 1414    BMET    Component Value Date/Time   NA 141 05/13/2021 0444   NA 142 03/27/2021 1123   K 4.6 05/13/2021 0444   CL 112 (H) 05/13/2021 0444   CO2 23 05/13/2021 0444   GLUCOSE 98 05/13/2021 0444   BUN 31 (H) 05/13/2021 0444   BUN 17 03/27/2021 1123   CREATININE 1.23 05/13/2021 0444   CREATININE 1.17 06/01/2016 1414   CALCIUM 8.2 (L) 05/13/2021 0444   GFRNONAA >60 05/13/2021 0444   GFRAA 39 (L) 10/01/2020 1157    INR    Component Value Date/Time   INR 1.4 (H) 05/01/2021 0715     Intake/Output Summary (Last 24 hours) at 05/13/2021 0756 Last data filed at 05/13/2021 0307 Gross per 24 hour  Intake 1630.96 ml  Output 1425 ml  Net 205.96 ml     Assessment/Plan:  75 y.o. 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 I agree with the above.   [name]. [name], MD [facility] Phone Number: [phone] 05/13/2021 5:22 PM

## 2021-05-13 NOTE — Progress Notes (Signed)
[facility] PROGRESS NOTE    Progress Note  [name]  [id number] DOB: 05/22/1946 DOA: 04/08/2021 PCP: Ryan Nakai, MD     Brief Narrative:   AMAURIS [name] is an 75 y.o. 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  In this admission the patient developed significant AKI left hydronephrosis and ATN from hypotension requiring he will dialysis transiently which has now been discontinued and retroperitoneal bleed    Assessment/Plan:   Acute respiratory failure with hypoxia: Multifactorial likely due to PE and pneumonia. Required high flow nasal cannula at 1 point which has now been decreased to 2 L of oxygen, he did receive 1 dose of Lasix on 05/08/2021. And subsequently weaned to room air.  Acute pulmonary embolism/acute lower extremity DVT: In setting of COVID infection, was initially on Coumadin which had to be discontinued due to retroperitoneal hematoma and intramuscular hemorrhage. VQ scan done on 05/08/2019 lower extremity DVT after discussing with vascular surgery he was restarted back on IV heparin.  Dysphagia possible thrush: Started on nystatin for 10 days.  Possible aspiration: Patient completed course of IV vancomycin since then. Speech reevaluated the patient recommended a d regular diet.  COVID-19 pneumonia/multifocal pneumonia: Records of remdesivir and steroids. Resolved.  Poor oral intake: Core track was placed dietitian was consulted.  Possibly multifactorial in the setting of infection and difficulty  swallowing. Clear for diet per speech continue tube feedings and wean them off as his oral intake continues to improve. Changes tube feedings to nocturnal. Document percentage of oral intake  Right critical lower limb ischemia: Vascular surgery was reconsulted and plan for surgical intervention on 05/12/2021 with an above-the-knee amputation. Further management per vascular surgery.  Acute blood loss anemia/retroperitoneal hematoma: CT scan of the abdomen pelvis on 04/18/2021 showed significantly large retroperitoneal hematoma Coumadin was stopped he was given vitamin K and FFP for reversal of his INR. CT scan on 04/24/2021 that showed unchanged. He was restarted back on IV heparin after discussing with vascular surgery.  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 Patient is status post post percutaneous nephrostomy tube placement on 04/20/2021 Peak at 5.3 nephrology was consulted transiently started on hemodialysis. Creatinine has returned to baseline with good urine output, HD catheter has been discontinued.  Left hydronephrosis: Left percutaneous nephrostomy tube placed on 04/20/2021, they recommended to continue for at least 2 months and follow-up with urology as an outpatient.  Chronic systolic heart failure: Appears to be euvolemic.  Permanent atrial fibrillation with a history of a bioprosthetic mitral valve: Currently on IV heparin and amiodarone stable.  Diabetes mellitus type 2: With an A1c of 5.4.  Essential hypertension: Continue to hold antihypertensive medication, seems stable.    Sacral decubitus pressure injury of skin stage II:  RN Pressure Injury Documentation: Pressure Injury 04/21/21 Coccyx Medial Stage 2 -  Partial thickness loss of dermis presenting as a shallow open injury with a red, pink wound bed without slough. (Active)   04/21/21 1255  Location: Coccyx  Location Orientation: Medial  Staging: Stage 2 -  Partial thickness loss  of dermis presenting as a shallow open injury with a red, pink wound bed without slough.  Wound Description (Comments):   Present on Admission: No     DVT prophylaxis: IV heparin Family Communication:none Status is: Inpatient  Remains inpatient appropriate because:Hemodynamically unstable  Dispo: The patient is from: Home              Anticipated d/c is to: SNF              Patient currently is not medically stable to d/c.   Difficult to place patient No        Code Status:     Code Status Orders  (From admission, onward)           Start     Ordered   04/08/21 1211  Full code  Continuous        04/08/21 1212           Code Status History     Date Active Date Inactive Code Status Order ID Comments User Context   03/06/2021 1816 03/13/2021 0325 Full Code [id number]  Ortencia Kick Inpatient   02/25/2021 1202 02/25/2021 2000 Full Code [id number]  Cherre Robins, MD Inpatient   01/30/2021 1513 01/30/2021 2320 Full Code [id number]  Larey Dresser, MD Inpatient   08/26/2020 1818 09/06/2020 2203 Full Code [id number]  Ledora Bottcher, [place] Inpatient   06/11/2016 1627 06/12/2016 1608 Full Code [id number]  Constance Haw, MD Inpatient   08/30/2015 0114 08/30/2015 1540 Full Code [id number]  Edwin Dada, MD Inpatient   09/29/2012 1803 10/06/2012 2104 Full Code [id number]  Cephus Richer, RN Inpatient   09/26/2012 1331 09/29/2012 1803 Full Code [id number]  Lenox Ahr, RN Inpatient      Advance Directive Documentation    Flowsheet Row Most Recent Value  Type of Advance Directive Living will  Pre-existing out of facility DNR order (yellow form or pink MOST form) --  "MOST" Form in Place? --         IV Access:   Peripheral IV   Procedures and diagnostic studies:   DG CHEST PORT 1 VIEW  Result Date: 05/11/2021 CLINICAL DATA:   Cough, COVID-19 positive. EXAM: PORTABLE CHEST 1 VIEW COMPARISON:  May 08, 2021. FINDINGS: Stable cardiomegaly. Feeding tube is seen entering stomach. Left-sided pacemaker is unchanged in position. Status post cardiac valve repair. Right-sided PICC line is unchanged. No pneumothorax is noted. Stable bilateral lung opacities are noted concerning for edema or pneumonia with small bilateral pleural effusions. Bony thorax is unremarkable. IMPRESSION: Stable support apparatus. Stable bilateral lung opacities are noted. Aortic Atherosclerosis (ICD10-I70.0). Electronically Signed   By: Marijo Conception M.D.   On: 05/11/2021 11:57     Medical Consultants:   None.   Subjective:    Linna Hoff no complaints this morning he would like to eat.  Objective:    Vitals:   05/13/21 0459 05/13/21 0500 05/13/21 0600 05/13/21 0603  BP:  (!) 105/58 (!) 95/53 100/61  Pulse:  78 77   Resp:      Temp:      TempSrc:      SpO2:  99% 96%   Weight: 79.8 kg     Height:       SpO2: 96 % O2 Flow Rate (L/min): 3 L/min FiO2 (%): (S) 60 %   Intake/Output Summary (Last 24 hours) at 05/13/2021 0800 Last data filed at 05/13/2021 0307 Gross per 24 hour  Intake  1630.96 ml  Output 1425 ml  Net 205.96 ml   Filed Weights   05/10/21 0338 05/12/21 0446 05/13/21 0459  Weight: 83.9 kg 84.8 kg 79.8 kg    Exam: General exam: In no acute distress. Respiratory system: Good air movement and clear to auscultation. Cardiovascular system: S1 & S2 heard, RRR. No JVD. Gastrointestinal system: Abdomen is nondistended, soft and nontender.  Extremities: Right AKA Skin: No rashes, lesions or ulcers  Data Reviewed:    Labs: Basic Metabolic Panel: Recent Labs  Lab 05/08/21 0400 05/10/21 0407 05/13/21 0444  NA 141 141 141  K 4.3 4.6 4.6  CL 111 111 112*  CO2 '24 24 23  '$ GLUCOSE 106* 166* 98  BUN 50* 45* 31*  CREATININE 1.29* 1.13 1.23  CALCIUM 8.6* 8.7* 8.2*   GFR Estimated Creatinine Clearance: 55.2  mL/min (by C-G formula based on SCr of 1.23 mg/dL). Liver Function Tests: No results for input(s): AST, ALT, ALKPHOS, BILITOT, PROT, ALBUMIN in the last 168 hours. No results for input(s): LIPASE, AMYLASE in the last 168 hours. No results for input(s): AMMONIA in the last 168 hours. Coagulation profile No results for input(s): INR, PROTIME in the last 168 hours. COVID-19 Labs  No results for input(s): DDIMER, FERRITIN, LDH, CRP in the last 72 hours.  Lab Results  Component Value Date   SARSCOV2NAA POSITIVE (A) 04/28/2021   [place] NEGATIVE 04/08/2021   [place] NEGATIVE 03/03/2021   [place] NEGATIVE 01/28/2021    CBC: Recent Labs  Lab 05/09/21 0842 05/10/21 0407 05/11/21 0430 05/12/21 0430 05/13/21 0444  WBC 12.9* 11.7* 10.2 10.2 13.6*  HGB 9.9* 9.6* 9.7* 9.7* 8.6*  HCT 32.4* 32.2* 32.0* 31.8* 28.0*  MCV 81.0 81.7 81.6 81.3 82.4  PLT 218 217 192 141* 110*   Cardiac Enzymes: No results for input(s): CKTOTAL, CKMB, CKMBINDEX, TROPONINI in the last 168 hours. BNP (last 3 results) Recent Labs    08/25/20 1121 03/27/21 1123  PROBNP 21,395* 3,146*   CBG: Recent Labs  Lab 05/12/21 1357 05/12/21 1711 05/12/21 2035 05/13/21 0001 05/13/21 0440  GLUCAP 91 67* 126* 88 100*   D-Dimer: No results for input(s): DDIMER in the last 72 hours. Hgb A1c: No results for input(s): HGBA1C in the last 72 hours. Lipid Profile: No results for input(s): CHOL, HDL, LDLCALC, TRIG, CHOLHDL, LDLDIRECT in the last 72 hours. Thyroid function studies: No results for input(s): TSH, T4TOTAL, T3FREE, THYROIDAB in the last 72 hours.  Invalid input(s): FREET3 Anemia work up: No results for input(s): VITAMINB12, FOLATE, FERRITIN, TIBC, IRON, RETICCTPCT in the last 72 hours. Sepsis Labs: Recent Labs  Lab 05/10/21 0407 05/11/21 0430 05/12/21 0430 05/13/21 0444  WBC 11.7* 10.2 10.2 13.6*   Microbiology No results found for this or any previous visit (from the past 240  hour(s)).   Medications:    amiodarone  200 mg Oral Daily   vitamin C  500 mg Oral Daily   atorvastatin  40 mg Oral q1800   benzonatate  100 mg Oral TID   Chlorhexidine Gluconate Cloth  6 each Topical Daily   dextromethorphan-guaiFENesin  1 tablet Oral BID   docusate sodium  100 mg Oral BID   feeding supplement  237 mL Oral BID BM   feeding supplement (OSMOLITE 1.5 CAL)  1,050 mL Per Tube Q24H   feeding supplement (PROSource TF)  45 mL Per Tube QID   insulin aspart  0-15 Units Subcutaneous Q4H   midodrine  10 mg Oral TID WC   multivitamin with  minerals  1 tablet Oral Daily   mupirocin ointment  1 application Nasal BID   nystatin  5 mL Oral QID   pantoprazole  40 mg Oral Daily   sodium chloride flush  10-40 mL Intracatheter Q12H   sodium chloride flush  3 mL Intravenous Q12H   zinc sulfate  220 mg Oral Daily   Continuous Infusions:  heparin 1,750 Units/hr (05/13/21 0554)      LOS: 35 days   Charlynne Cousins  [facility]  05/13/2021, 8:00 AM

## 2021-05-14 ENCOUNTER — Encounter (HOSPITAL_COMMUNITY): Payer: Self-pay | Admitting: Internal Medicine

## 2021-05-14 DIAGNOSIS — J9601 Acute respiratory failure with hypoxia: Secondary | ICD-10-CM | POA: Diagnosis not present

## 2021-05-14 DIAGNOSIS — I5022 Chronic systolic (congestive) heart failure: Secondary | ICD-10-CM | POA: Diagnosis not present

## 2021-05-14 DIAGNOSIS — I10 Essential (primary) hypertension: Secondary | ICD-10-CM | POA: Diagnosis not present

## 2021-05-14 DIAGNOSIS — I70229 Atherosclerosis of native arteries of extremities with rest pain, unspecified extremity: Secondary | ICD-10-CM | POA: Diagnosis not present

## 2021-05-14 LAB — CBC
HCT: 26 % — ABNORMAL LOW (ref 39.0–52.0)
Hemoglobin: 7.9 g/dL — ABNORMAL LOW (ref 13.0–17.0)
MCH: 25.2 pg — ABNORMAL LOW (ref 26.0–34.0)
MCHC: 30.4 g/dL (ref 30.0–36.0)
MCV: 82.8 fL (ref 80.0–100.0)
Platelets: 83 10*3/uL — ABNORMAL LOW (ref 150–400)
RBC: 3.14 MIL/uL — ABNORMAL LOW (ref 4.22–5.81)
RDW: 24.6 % — ABNORMAL HIGH (ref 11.5–15.5)
WBC: 10.5 10*3/uL (ref 4.0–10.5)
nRBC: 0 % (ref 0.0–0.2)

## 2021-05-14 LAB — GLUCOSE, CAPILLARY
Glucose-Capillary: 101 mg/dL — ABNORMAL HIGH (ref 70–99)
Glucose-Capillary: 113 mg/dL — ABNORMAL HIGH (ref 70–99)
Glucose-Capillary: 119 mg/dL — ABNORMAL HIGH (ref 70–99)
Glucose-Capillary: 137 mg/dL — ABNORMAL HIGH (ref 70–99)
Glucose-Capillary: 83 mg/dL (ref 70–99)

## 2021-05-14 LAB — PATHOLOGIST SMEAR REVIEW

## 2021-05-14 LAB — HEPARIN LEVEL (UNFRACTIONATED): Heparin Unfractionated: 0.54 IU/mL (ref 0.30–0.70)

## 2021-05-14 LAB — APTT
aPTT: 135 seconds — ABNORMAL HIGH (ref 24–36)
aPTT: 64 seconds — ABNORMAL HIGH (ref 24–36)

## 2021-05-14 MED ORDER — SODIUM CHLORIDE 0.9% FLUSH: 3.0000 mL | Freq: Two times a day (BID) | INTRAVENOUS | Status: DC

## 2021-05-14 MED ORDER — ARGATROBAN 50 MG/50ML IV SOLN
1.0000 ug/kg/min | INTRAVENOUS | Status: DC
  Administered 2021-05-14: 1 ug/kg/min via INTRAVENOUS
  Filled 2021-05-14: qty 50

## 2021-05-14 MED ORDER — SODIUM CHLORIDE 0.9% FLUSH: 3.0000 mL | INTRAVENOUS | Status: DC | PRN

## 2021-05-14 MED ORDER — SODIUM CHLORIDE 0.9 % IV SOLN: 250.0000 mL | INTRAVENOUS | Status: DC | PRN

## 2021-05-14 MED ORDER — ARGATROBAN 50 MG/50ML IV SOLN
0.6000 ug/kg/min | INTRAVENOUS | Status: AC
  Administered 2021-05-14 – 2021-05-15 (×3): 0.5 ug/kg/min via INTRAVENOUS
  Administered 2021-05-17 – 2021-05-18 (×2): 0.6 ug/kg/min via INTRAVENOUS
  Filled 2021-05-14 (×7): qty 50

## 2021-05-14 NOTE — Consult Note (Addendum)
[place]  Telephone:([phone]) [phone] Fax:(336) [phone]    [place]  Referring MD: Dr. Charlynne Cousins  Reason for Referral: Thrombocytopenia, suspicion for HIT  HPI: Mr. Ryan [name] is a 75 year old male with a past medical history significant for A. 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  He presented to the emergency department with a foot infection.  He had been having difficulty with a heel ulcer that had not been healing well.  The day of admission, he was due for surgery (debridement and possible amputation) but they were unable to do surgery due to a supratherapeutic INR.  The patient was on warfarin for atrial fibrillation.  His INR on admission was 3.2.  Review of his CBC on the day of admission which was 04/08/2021 showed a WBC of 6.4, hemoglobin 10.1, and platelet count of 269,000.  He tested negative for COVID-19 on admission.    He has had an extensive hospitalization and records have been reviewed.  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  Platelet count today is down to 83,000.  HIT panel obtained and currently pending.  SRA has been ordered and pending.  The patient has been transition from heparin to argatroban.  Review of prior lab work showed a normal  platelet count this admission up until 05/11/2021.  The patient did have low platelets in May to June 2022 and also December 2021.  Platelets got as low as 80,000 on 03/08/2021.  It appears that he was hospitalized during that time for PAD and underwent bypass surgery.  The patient has received heparin on multiple occasions including from 03/06/2021 through 03/12/2021, 04/09/2021 through 04/17/2021, and 05/04/2021 through 05/14/2021. 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  I met with Mr. Ryan [name] in his hospital room.  He was sleeping at the time my visit.  He barely awaken to answer questions for you.  However, he did mumble some answers.  He currently denies bleeding.  He denies pain.  He knows that he is at [facility].  He could not tell me what year it is -started to say that it was 2027.  No other concerns voiced today.  Hematology was asked to see the patient to make recommendations regarding the patient's thrombocytopenia.  Past Medical History:  Diagnosis Date   AICD (automatic cardioverter/defibrillator) present    Arthritis    Atrial fibrillation (HCC)    Cardiomyopathy, dilated (HCC)    CHF (congestive heart failure) ([place])    Diabetes mellitus without complication ([place])    Endocarditis    Hypertension    Peripheral vascular disease ([place])    PVC's (premature ventricular contractions)    SVT (supraventricular tachycardia)  long RP   :  Past Surgical History:  Procedure Laterality Date   ABDOMINAL AORTAGRAM N/A 10/03/2012   Procedure: ABDOMINAL Maxcine Ham;  Surgeon: Butch Penny  Trula Slade, MD;  Location: [place] CATH LAB;  Service: Cardiovascular;  Laterality: N/A;   ABDOMINAL AORTOGRAM W/LOWER EXTREMITY N/A 02/25/2021   Procedure: ABDOMINAL AORTOGRAM W/LOWER EXTREMITY;  Surgeon: Cherre Robins, MD;  Location: [place] CV LAB;  Service: Cardiovascular;  Laterality: N/A;   ABDOMINAL AORTOGRAM W/LOWER EXTREMITY N/A 04/13/2021   Procedure: ABDOMINAL  AORTOGRAM W/LOWER EXTREMITY;  Surgeon: Waynetta Sandy, MD;  Location: [place] CV LAB;  Service: Cardiovascular;  Laterality: N/A;   AMPUTATION Right 05/12/2021   Procedure: AMPUTATION ABOVE KNEE RIGHT;  Surgeon: Angelia Mould, MD;  Location: [facility] OR;  Service: Vascular;  Laterality: Right;   APPLICATION OF WOUND VAC Right 04/09/2021   Procedure: APPLICATION OF WOUND VAC;  Surgeon: Cherre Robins, MD;  Location: [place];  Service: Vascular;  Laterality: Right;   BYPASS GRAFT FEMORAL-PERONEAL Right 03/06/2021   Procedure: RIGHT ABOVE KNEE POPLITEAL ARTERY-PERONEAL BYPASS;  Surgeon: Cherre Robins, MD;  Location: [place];  Service: Vascular;  Laterality: Right;   CORONARY ANGIOGRAM  09/21/2012   Procedure: CORONARY ANGIOGRAM;  Surgeon: [name], MD;  Location: [facility] CATH LAB;  Service: Cardiovascular;;   EP IMPLANTABLE DEVICE N/A 06/11/2016   Procedure: ICD Implant;  Surgeon: Will Meredith Leeds, MD;  Location: [place] CV LAB;  Service: Cardiovascular;  Laterality: N/A;   EXTREMITY WIRE/PIN REMOVAL  09/14/2012   Procedure: REMOVAL K-WIRE/PIN EXTREMITY;  Surgeon: [name], MD;  Location: [place];  Service: Orthopedics;  Laterality: Right;  Right Foot   I & D EXTREMITY  09/14/2012   Procedure: IRRIGATION AND DEBRIDEMENT EXTREMITY;  Surgeon: Tennis Must, MD;  Location: [place];  Service: Orthopedics;  Laterality: Right;   INTRAOPERATIVE TRANSESOPHAGEAL ECHOCARDIOGRAM  09/26/2012   Procedure: INTRAOPERATIVE TRANSESOPHAGEAL ECHOCARDIOGRAM;  Surgeon: [name], MD;  Location: [facility] OR;  Service: Open Heart Surgery;  Laterality: N/A;   IR FLUORO GUIDE CV LINE RIGHT  04/20/2021   IR NEPHROSTOMY PLACEMENT LEFT  04/20/2021   IR US GUIDE VASC ACCESS RIGHT  04/20/2021   MITRAL VALVE REPLACEMENT  09/26/2012   Procedure: MITRAL VALVE (MV) REPLACEMENT;  Surgeon: [name], MD;  Location: [place];  Service: Open Heart Surgery;  Laterality: N/A;   RIGHT HEART CATH N/A 08/29/2020    Procedure: RIGHT HEART CATH;  Surgeon: Larey Dresser, MD;  Location: [place] CV LAB;  Service: Cardiovascular;  Laterality: N/A;   RIGHT HEART CATHETERIZATION  09/21/2012   Procedure: RIGHT HEART CATH;  Surgeon: [name], MD;  Location: [facility] CATH LAB;  Service: Cardiovascular;;   RIGHT/LEFT HEART CATH AND CORONARY ANGIOGRAPHY N/A 01/30/2021   Procedure: RIGHT/LEFT HEART CATH AND CORONARY ANGIOGRAPHY;  Surgeon: Larey Dresser, MD;  Location: [place] CV LAB;  Service: Cardiovascular;  Laterality: N/A;   SVT ABLATION N/A 09/03/2020   Procedure: SVT ABLATION;  Surgeon: Constance Haw, MD;  Location: [place] CV LAB;  Service: Cardiovascular;  Laterality: N/A;   TEE WITHOUT CARDIOVERSION  09/18/2012   Procedure: TRANSESOPHAGEAL ECHOCARDIOGRAM (TEE);  Surgeon: [name], MD;  Location: [facility] ENDOSCOPY;  Service: Cardiovascular;  Laterality: N/A;   WOUND DEBRIDEMENT Right 04/09/2021   Procedure: DEBRIDEMENT RIGHT HEEL WOUND AND PARTIAL FIRST TOE AMPUTATION AND SECOND TOE AMPUTATION. Application of Myriad skin substitute;  Surgeon: Cherre Robins, MD;  Location: [facility] OR;  Service: Vascular;  Laterality: Right;  :   CURRENT MEDS: Current Facility-Administered Medications  Medication Dose Route Frequency Provider Last Rate Last Admin   0.9 %  sodium chloride infusion  250 mL Intravenous PRN Charlynne Cousins, MD       acetaminophen (TYLENOL) tablet 650 mg  650 mg Oral Q8H PRN Barbie Banner, PA-C   650 mg at 05/03/21 2130   alum & mag hydroxide-simeth (MAALOX/MYLANTA) 200-200-20 MG/5ML suspension 30 mL  30 mL Oral Q4H PRN Barbie Banner, PA-C   30 mL at 04/17/21 0144   amiodarone (PACERONE) tablet 200 mg  200 mg Oral Daily Barbie Banner, PA-C   200 mg at 05/14/21 4132   antiseptic oral rinse (BIOTENE) solution 15 mL  15 mL Mouth Rinse PRN Barbie Banner, PA-C       ascorbic acid (VITAMIN C) tablet 500 mg  500 mg Oral Daily Barbie Banner, PA-C   500 mg at 05/13/21  4401   atorvastatin (LIPITOR) tablet 40 mg  40 mg Oral [id number] Barbie Banner, PA-C   40 mg at 05/13/21 1820   benzonatate (TESSALON) capsule 100 mg  100 mg Oral TID Barbie Banner, PA-C   100 mg at 05/14/21 0920   benzonatate (TESSALON) capsule 200 mg  200 mg Oral TID PRN Barbie Banner, PA-C   200 mg at 05/10/21 0347   bisacodyl (DULCOLAX) EC tablet 5 mg  5 mg Oral Daily PRN Barbie Banner, PA-C   5 mg at 04/17/21 3664   Chlorhexidine Gluconate Cloth 2 % PADS 6 each  6 each Topical Daily Barbie Banner, PA-C   6 each at 05/13/21 0001   chlorpheniramine-HYDROcodone (TUSSIONEX) 10-8 MG/5ML suspension 5 mL  5 mL Oral QHS PRN Mariel Aloe, MD       dextromethorphan-guaiFENesin ([place] DM) 30-600 MG per 12 hr tablet 1 tablet  1 tablet Oral BID Barbie Banner, PA-C   1 tablet at 05/14/21 0920   docusate sodium (COLACE) capsule 100 mg  100 mg Oral BID Barbie Banner, PA-C   100 mg at 05/14/21 0920   feeding supplement (ENSURE ENLIVE / ENSURE PLUS) liquid 237 mL  237 mL Oral BID BM Barbie Banner, PA-C   237 mL at 05/13/21 1820   feeding supplement (OSMOLITE 1.5 CAL) liquid 1,050 mL  1,050 mL Per Tube Q24H Mariel Aloe, MD   Stopped at 05/14/21 0905   feeding supplement (PROSource TF) liquid 45 mL  45 mL Per Tube QID Mariel Aloe, MD   45 mL at 05/14/21 0925   guaiFENesin-dextromethorphan (ROBITUSSIN DM) 100-10 MG/5ML syrup 5 mL  5 mL Oral Q4H PRN Barbie Banner, PA-C   5 mL at 05/10/21 2342   heparin ADULT infusion 100 units/mL (25000 units/262m)  1,750 Units/hr Intravenous Continuous SBarbie Banner PA-C 17.5 mL/hr at 05/14/21 0455 1,750 Units/hr at 05/14/21 0455   hydrALAZINE (APRESOLINE) injection 10 mg  10 mg Intravenous Q4H PRN SBarbie Banner PA-C       HYDROcodone-acetaminophen (NORCO) 7.5-325 MG per tablet 1 tablet  1 tablet Oral Q6H PRN SBarbie Banner PA-C   1 tablet at 05/14/21 0416   insulin aspart (novoLOG) injection 0-15 Units  0-15 Units Subcutaneous Q4H  SBarbie Banner PA-C   2 Units at 05/14/21 0416   metoprolol tartrate (LOPRESSOR) injection 5 mg  5 mg Intravenous Q4H PRN SBarbie Banner PA-C   5 mg at 04/27/21 2313   midodrine (PROAMATINE) tablet 10 mg  10 mg Oral TID WC SBarbie Banner PA-C   10 mg at 05/14/21 0919   morphine 2  MG/ML injection 2 mg  2 mg Intravenous Q4H PRN Barbie Banner, PA-C   2 mg at 05/14/21 4975   multivitamin with minerals tablet 1 tablet  1 tablet Oral Daily Barbie Banner, PA-C   1 tablet at 05/14/21 0919   mupirocin ointment (BACTROBAN) 2 % 1 application  1 application Nasal BID Barbie Banner, PA-C   1 application at 30/05/11 0211   nystatin (MYCOSTATIN) 100000 UNIT/ML suspension 500,000 Units  5 mL Oral QID Barbie Banner, PA-C   500,000 Units at 05/14/21 0920   ondansetron (ZOFRAN) injection 4 mg  4 mg Intravenous Q6H PRN Barbie Banner, PA-C   4 mg at 05/06/21 2129   ondansetron (ZOFRAN) tablet 4 mg  4 mg Oral Q6H PRN Barbie Banner, PA-C   4 mg at 04/16/21 1212   pantoprazole (PROTONIX) EC tablet 40 mg  40 mg Oral Daily Barbie Banner, PA-C   40 mg at 05/14/21 0920   polyethylene glycol (MIRALAX / GLYCOLAX) packet 17 g  17 g Oral Daily PRN Barbie Banner, PA-C   17 g at 04/16/21 2147   senna-docusate (Senokot-S) tablet 1 tablet  1 tablet Oral QHS PRN Barbie Banner, PA-C       sodium chloride flush (NS) 0.9 % injection 10-40 mL  10-40 mL Intracatheter Q12H Barbie Banner, PA-C   30 mL at 05/13/21 2110   sodium chloride flush (NS) 0.9 % injection 10-40 mL  10-40 mL Intracatheter PRN Barbie Banner, PA-C   10 mL at 05/14/21 1735   sodium chloride flush (NS) 0.9 % injection 3 mL  3 mL Intravenous Q12H Barbie Banner, PA-C   3 mL at 05/12/21 2016   sodium chloride flush (NS) 0.9 % injection 3 mL  3 mL Intravenous PRN Barbie Banner, PA-C       sodium chloride flush (NS) 0.9 % injection 3 mL  3 mL Intravenous Q12H Charlynne Cousins, MD       sodium chloride flush (NS) 0.9 % injection  3 mL  3 mL Intravenous PRN Charlynne Cousins, MD       traZODone (DESYREL) tablet 50 mg  50 mg Oral QHS PRN Barbie Banner, PA-C   50 mg at 05/05/21 2126   zinc sulfate capsule 220 mg  220 mg Oral Daily Barbie Banner, PA-C   220 mg at 05/13/21 6701    Allergies  Allergen Reactions   Geralyn Flash [Fish Allergy] Nausea And Vomiting  :   Family History  Problem Relation Age of Onset   Hypertension Mother    Hypertension Father   :   Social History   Socioeconomic History   Marital status: Divorced    Spouse name: Not on file   Number of children: 3   Years of education: Not on file   Highest education level: Not on file  Occupational History   Not on file  Tobacco Use   Smoking status: Never   Smokeless tobacco: Never  Vaping Use   Vaping Use: Never used  Substance and Sexual Activity   Alcohol use: No    Alcohol/week: 0.0 standard drinks   Drug use: No   Sexual activity: Not on file  Other Topics Concern   Not on file  Social History Narrative   Daughter lives with him.  Retired Advertising account planner   Social Determinants of Radio broadcast assistant Strain: Not on [employer] Insecurity: No Food Insecurity  Worried About Charity fundraiser in the Last Year: Never true   [place] in the Last Year: Never true  Transportation Needs: No Transportation Needs   Lack of Transportation (Medical): No   Lack of Transportation (Non-Medical): No  Physical Activity: Not on file  Stress: Not on file  Social Connections: Not on file  Intimate Partner Violence: Not on file  :  REVIEW OF SYSTEMS: Unable to obtain.  Exam: Patient Vitals for the past 24 hrs:  BP Temp Temp src Pulse Resp SpO2  05/14/21 0805 108/62 98.4 F (36.9 C) Oral 71 16 94 %  05/14/21 0406 (!) 117/54 98.1 F (36.7 C) Oral 70 17 96 %  05/13/21 2338 113/61 98.1 F (36.7 C) Oral 72 16 92 %  05/13/21 2036 102/77 97.7 F (36.5 C) Oral 73 20 93 %  05/13/21 1545 (!) 115/51 97.8 F (36.6 C) Oral 74  18 92 %  05/13/21 1200 115/77 98.4 F (36.9 C) Oral 74 20 90 %    General: Chronically ill-appearing male, resting quietly, no distress.   Eyes:  no scleral icterus.   ENT:  Cortrak in place.    Respiratory: lungs were clear bilaterally without wheezing or crackles.   Cardiovascular:  Regular rate and rhythm, S1/S2, without murmur, rub or gallop.   GI:  abdomen was soft, flat, nontender, nondistended, without organomegaly.   Musculoskeletal:  Right AKA. Staples intact, no bleeding.    Skin: No ecchymosis or petechiae.   Neuro exam was nonfocal.     LABS:  Lab Results  Component Value Date   WBC 10.5 05/14/2021   HGB 7.9 (L) 05/14/2021   HCT 26.0 (L) 05/14/2021   PLT 83 (L) 05/14/2021   GLUCOSE 98 05/13/2021   CHOL 100 03/07/2021   TRIG 31 03/07/2021   HDL 35 (L) 03/07/2021   LDLCALC 59 03/07/2021   ALT 29 05/04/2021   AST 41 05/04/2021   NA 141 05/13/2021   K 4.6 05/13/2021   CL 112 (H) 05/13/2021   CREATININE 1.23 05/13/2021   BUN 31 (H) 05/13/2021   CO2 23 05/13/2021   INR 1.4 (H) 05/01/2021   HGBA1C 5.4 04/08/2021    CT ABDOMEN PELVIS WO CONTRAST  Result Date: 04/24/2021 CLINICAL DATA:  Abdominal distension, left retroperitoneal hematoma, status post left percutaneous nephrostomy EXAM: CT ABDOMEN AND PELVIS WITHOUT CONTRAST TECHNIQUE: Multidetector CT imaging of the abdomen and pelvis was performed following the standard protocol without IV contrast. COMPARISON:  04/19/2021 FINDINGS: Lower chest: Small to moderate bilateral pleural effusions and associated atelectasis or consolidation, increased compared to prior examination. Mitral valve prosthesis. Cardiomegaly. Hepatobiliary: No solid liver abnormality is seen. Sludge and gallstones in the dependent gallbladder. No gallbladder wall thickening, or biliary dilatation. Pancreas: Unremarkable. No pancreatic ductal dilatation or surrounding inflammatory changes. Spleen: Normal in size without significant abnormality.  Adrenals/Urinary Tract: Adrenal glands are unremarkable. Status post interval left percutaneous nephrostomy with formed pigtail in the left renal pelvis and relief of previously seen hydronephrosis. No right-sided hydronephrosis. Bladder is unremarkable. Stomach/Bowel: Stomach is within normal limits. Appendix appears normal. No evidence of bowel wall thickening, distention, or inflammatory changes. Vascular/Lymphatic: Aortic atherosclerosis. No enlarged abdominal or pelvic lymph nodes. Reproductive: No mass or other significant abnormality. Other: Anasarca, worsened compared to prior examination. No abdominopelvic ascites. No significant interval change in a large, heterogeneously layering hematoma centered about the left aspect of the retroperitoneum and mesocolon, measuring 16.7 x 9.2 cm in greatest axial extent (series 3,  image 56). Additional sizable hematoma component in the left iliacus muscle measuring approximately 5.2 x 2.5 cm, likewise not significantly changed (series 3, image 70). Musculoskeletal: No acute or significant osseous findings. IMPRESSION: 1. No significant interval change in a large, heterogeneously layering hematoma centered about the left aspect of the retroperitoneum and mesocolon, measuring 16.7 x 9.2 cm in greatest axial extent. 2. Additional sizable hematoma component in the left iliacus muscle measuring approximately 5.2 x 2.5 cm, likewise not significantly changed. 3. Status post interval left percutaneous nephrostomy with formed pigtail in the left renal pelvis and relief of previously seen hydronephrosis. No right-sided hydronephrosis. 4. Small to moderate bilateral pleural effusions and associated atelectasis or consolidation, increased compared to prior examination. 5. Anasarca, worsened compared to prior examination. 6. Cholelithiasis. Aortic Atherosclerosis (ICD10-I70.0). Electronically Signed   By: Eddie Candle M.D.   On: 04/24/2021 15:28   CT ABDOMEN PELVIS WO  CONTRAST  Result Date: 04/18/2021 CLINICAL DATA:  Patient with anemia.  On anticoagulation. EXAM: CT ABDOMEN AND PELVIS WITHOUT CONTRAST TECHNIQUE: Multidetector CT imaging of the abdomen and pelvis was performed following the standard protocol without IV contrast. COMPARISON:  Renal ultrasound 03/09/2021 FINDINGS: Lower chest: Patchy ground-glass and consolidative opacities within the lower lobes bilaterally most compatible with atelectasis. Trace bilateral pleural effusions. Hepatobiliary: Liver is normal in size and contour. Stones and sludge within the gallbladder lumen. No intrahepatic or extrahepatic biliary ductal dilatation. Pancreas: Unremarkable Spleen: Unremarkable Adrenals/Urinary Tract: Normal adrenal glands. The left kidney is displaced cranially. There is moderate left hydronephrosis. Urinary bladder is unremarkable. Stomach/Bowel: No abnormal bowel wall thickening or evidence for bowel obstruction. No free intraperitoneal air. Vascular/Lymphatic: Normal caliber abdominal aorta. No retroperitoneal lymphadenopathy. Reproductive: Unremarkable. Other: There is a large 17 x 17 x 7 cm left retroperitoneal fluid collection with layering high density favored to represent blood products. Musculoskeletal: Mild expansion of the left iliacus muscle. Right hip joint degenerative changes. Lower thoracic and lumbar spine degenerative changes. There is mild height loss of the superior endplate of the L5 and L3 vertebral bodies. IMPRESSION: There is a large (up to 17 cm) left retroperitoneal hematoma. Additionally there is expansion of the left iliacus muscle most compatible with intramuscular hemorrhage. The large hematoma displaces the left kidney cranially. There is new moderate left hydronephrosis, likely secondary to mass effect on the left ureter. Age-indeterminate superior endplate compression deformities of the L5 and L3 vertebral bodies. Critical Value/emergent results were discussed at the time of  interpretation on 04/18/2021 at 1:47 pm to provider [facility] , who verbally acknowledged these results. Electronically Signed   By: Lovey Newcomer M.D.   On: 04/18/2021 13:52   DG Lumbar Spine 2-3 Views  Result Date: 04/16/2021 CLINICAL DATA:  Lower back pain EXAM: LUMBAR SPINE - 2-3 VIEW COMPARISON:  None. FINDINGS: There are five non-rib bearing lumbar-type vertebral bodies. There is normal alignment. There is mild age indeterminate wedging of L5 and T11. Moderate intervertebral disc space height loss of L4-5 and L5-S1. Mild intervertebral disc space height loss of L1-2 and L2-3. Multilevel endplate proliferative changes and facet arthropathy. Endplate sclerosis of L5, likely degenerative. Atherosclerotic calcifications. Diffuse gaseous distension of loops of bowel with air visualized in the rectum. IMPRESSION: 1. Mild age-indeterminate wedging of L5 and T11. Recommend correlation with point tenderness. 2. Moderate multilevel degenerative changes. 3. Atherosclerotic calcifications. Electronically Signed   By: Valentino Saxon MD   On: 04/16/2021 13:20   NM Pulmonary Perfusion  Result Date: 05/05/2021 CLINICAL DATA:  PE suspected, high prob EXAM: NUCLEAR MEDICINE PERFUSION LUNG SCAN TECHNIQUE: Perfusion images were obtained in multiple projections after intravenous injection of radiopharmaceutical. Ventilation scans intentionally deferred if perfusion scan and chest x-ray adequate for interpretation during COVID 19 epidemic. RADIOPHARMACEUTICALS:  4.3 mCi Tc-56mMAA IV COMPARISON:  Chest radiograph earlier today. FINDINGS: There is a wedge-shaped perfusion defect in the right upper lobe, with additional wedge-shaped perfusion defect in the right lower lobe. Left lung perfusion is minimally heterogeneous but no wedge-shaped defects are seen. Photopenia in the left upper lung zone is related to pacemaker. Cardiomegaly is seen. IMPRESSION: Positive for wedge-shaped perfusion defects in the right upper  and right lower lobe consistent with pulmonary emboli. Examination is positive for pulmonary embolus. Electronically Signed   By: MKeith RakeM.D.   On: 05/05/2021 15:39   IR Fluoro Guide CV Line Right  Result Date: 04/20/2021 INDICATION: 75year old male with spontaneous retroperitoneal hemorrhage, acute renal failure, with left-sided ureteral obstruction EXAM: ULTRASOUND-GUIDED LEFT PERCUTANEOUS NEPHROSTOMY IMAGE GUIDED TEMPORARY HEMODIALYSIS CATHETER COMPARISON:  CT 04/19/2021 MEDICATIONS: 2 g Rocephin; The antibiotic was administered in an appropriate time frame prior to skin puncture. ANESTHESIA/SEDATION: Fentanyl 50 mcg IV; Versed 0 mg IV Moderate Sedation Time:  0 The patient was continuously monitored during the procedure by the interventional radiology nurse under my direct supervision. CONTRAST:  117mOMNIPAQUE IOHEXOL 300 MG/ML SOLN - administered into the collecting system(s) FLUOROSCOPY TIME:  Fluoroscopy Time: 2 minutes 12 seconds (14 mGy). COMPLICATIONS: None PROCEDURE: Informed written consent was obtained from the patient's family after a thorough discussion of the procedural risks, benefits and alternatives, regarding image guided percutaneous nephrostomy. All questions were addressed. Informed written consent was also obtained from the patient's family after a discussion of the risks, benefits, and alternatives to treatment, regarding image guided hemodialysis catheter placement. Questions regarding the procedure were encouraged and answered. Patient positioned prone position on the fluoroscopy table. Ultrasound survey of the left flank was performed with images stored and sent to PACs. The patient was then prepped and draped in the usual sterile fashion. 1% lidocaine was used to anesthetize the skin and subcutaneous tissues for local anesthesia. A Chiba needle was then used to access a posterior inferior calyx with ultrasound guidance. With spontaneous urine returned through the needle,  passage of an 018 micro wire into the collecting system was performed under fluoroscopy. A small incision was made with an 11 blade scalpel, and the needle was removed from the wire. An Accustick system was then advanced over the wire into the collecting system under fluoroscopy. The metal stiffener and inner dilator were removed, and then a sample of fluid was aspirated through the 4 French outer sheath. Bentson wire was passed into the collecting system and the sheath removed. Ten French dilation of the soft tissues was performed. Using modified Seldinger technique, a 10 French pigtail catheter drain was placed over the Bentson wire. Wire and inner stiffener removed, and the pigtail was formed in the collecting system. Small amount of contrast confirmed position of the catheter. Patient tolerated the procedure well and remained hemodynamically stable throughout. No complications were encountered and no significant blood loss encountered The patient was then moved into a supine position to proceed with temporary hemodialysis catheter. The right neck was prepped with chlorhexidine in a sterile fashion, and a sterile drape was applied covering the operative field. Maximum barrier sterile technique with sterile gowns and gloves were used for the procedure. A timeout was performed prior to the initiation of  the procedure A micropuncture kit was utilized to access the right internal jugular vein under direct, real-time ultrasound guidance after the overlying soft tissues were anesthetized with 1% lidocaine with epinephrine. Ultrasound image documentation was performed. The microwire was kinked to measure appropriate catheter length. A guidewire was advanced to the level of the IVC. A 15 cm hemodialysis catheter was then placed over the wire. Final catheter positioning was confirmed and documented with a spot radiographic image. The catheter aspirates and flushes normally. The catheter was flushed with appropriate volume  heparin dwells. Dressings were applied. The patient tolerated the procedure well without immediate post procedural complication. IMPRESSION: Status post image guided left percutaneous nephrostomy, as well as same session image guided temporary hemodialysis catheter at the right IJ. Catheter may be converted if needed Signed, Dulcy Fanny. Dellia Nims, RPVI [facility] Radiology Electronically Signed   By: Corrie Mckusick D.O.   On: 04/20/2021 15:17   IR US Guide Vasc Access Right  Result Date: 04/20/2021 INDICATION: 75 year old male with spontaneous retroperitoneal hemorrhage, acute renal failure, with left-sided ureteral obstruction EXAM: ULTRASOUND-GUIDED LEFT PERCUTANEOUS NEPHROSTOMY IMAGE GUIDED TEMPORARY HEMODIALYSIS CATHETER COMPARISON:  CT 04/19/2021 MEDICATIONS: 2 g Rocephin; The antibiotic was administered in an appropriate time frame prior to skin puncture. ANESTHESIA/SEDATION: Fentanyl 50 mcg IV; Versed 0 mg IV Moderate Sedation Time:  0 The patient was continuously monitored during the procedure by the interventional radiology nurse under my direct supervision. CONTRAST:  85m OMNIPAQUE IOHEXOL 300 MG/ML SOLN - administered into the collecting system(s) FLUOROSCOPY TIME:  Fluoroscopy Time: 2 minutes 12 seconds (14 mGy). COMPLICATIONS: None PROCEDURE: Informed written consent was obtained from the patient's family after a thorough discussion of the procedural risks, benefits and alternatives, regarding image guided percutaneous nephrostomy. All questions were addressed. Informed written consent was also obtained from the patient's family after a discussion of the risks, benefits, and alternatives to treatment, regarding image guided hemodialysis catheter placement. Questions regarding the procedure were encouraged and answered. Patient positioned prone position on the fluoroscopy table. Ultrasound survey of the left flank was performed with images stored and  sent to PACs. The patient was then prepped and draped in the usual sterile fashion. 1% lidocaine was used to anesthetize the skin and subcutaneous tissues for local anesthesia. A Chiba needle was then used to access a posterior inferior calyx with ultrasound guidance. With spontaneous urine returned through the needle, passage of an 018 micro wire into the collecting system was performed under fluoroscopy. A small incision was made with an 11 blade scalpel, and the needle was removed from the wire. An Accustick system was then advanced over the wire into the collecting system under fluoroscopy. The metal stiffener and inner dilator were removed, and then a sample of fluid was aspirated through the 4 French outer sheath. Bentson wire was passed into the collecting system and the sheath removed. Ten French dilation of the soft tissues was performed. Using modified Seldinger technique, a 10 French pigtail catheter drain was placed over the Bentson wire. Wire and inner stiffener removed, and the pigtail was formed in the collecting system. Small amount of contrast confirmed position of the catheter. Patient tolerated the procedure well and remained hemodynamically stable throughout. No complications were encountered and no significant blood loss encountered The patient was then moved into a supine position to proceed with temporary hemodialysis catheter. The right neck was prepped with chlorhexidine in a sterile fashion, and a sterile drape was applied covering the operative field.  Maximum barrier sterile technique with sterile gowns and gloves were used for the procedure. A timeout was performed prior to the initiation of the procedure A micropuncture kit was utilized to access the right internal jugular vein under direct, real-time ultrasound guidance after the overlying soft tissues were anesthetized with 1% lidocaine with epinephrine. Ultrasound image documentation was performed. The microwire was kinked to measure  appropriate catheter length. A guidewire was advanced to the level of the IVC. A 15 cm hemodialysis catheter was then placed over the wire. Final catheter positioning was confirmed and documented with a spot radiographic image. The catheter aspirates and flushes normally. The catheter was flushed with appropriate volume heparin dwells. Dressings were applied. The patient tolerated the procedure well without immediate post procedural complication. IMPRESSION: Status post image guided left percutaneous nephrostomy, as well as same session image guided temporary hemodialysis catheter at the right IJ. Catheter may be converted if needed Signed, Dulcy Fanny. Dellia Nims, RPVI [facility] Radiology Electronically Signed   By: Corrie Mckusick D.O.   On: 04/20/2021 15:17   VAS [place] IVC/ILIAC (VENOUS ONLY)  Result Date: 05/04/2021 IVC/ILIAC STUDY Patient Name:  ASMAR BROZEK  Date of Exam:   05/04/2021 Medical Rec #: [id number]         Accession #:    [id number] Date of Birth: 1946/08/15         Patient Gender: M Patient Age:   14 years Exam Location:  [facility] Procedure:      VAS [place] IVC/ILIAC (VENOUS ONLY) Referring Phys: --------------------------------------------------------------------------------  Indications: DVT seen on lower extremity venous duplex Other Factors: COVID. Limitations: Air/bowel gas, obesity and hematoma at midline/along left iliacus muscle, seen on CT.  Comparison Study: No prior study Performing Technologist: Maudry Mayhew MHA, RDMS, RVT, RDCS  Examination Guidelines: A complete evaluation includes B-mode imaging, spectral Doppler, color Doppler, and power Doppler as needed of all accessible portions of each vessel. Bilateral testing is considered an integral part of a complete examination. Limited examinations for reoccurring indications may be performed as noted.   +----------------+---------+-----------+---------+-----------+----------------+       CIV       RT-PatentRT-ThrombusLT-PatentLT-Thrombus    Comments     +----------------+---------+-----------+---------+-----------+----------------+ Common Iliac                         patent                Unable to     Prox                                                    visualize right                                                            proximal CIV   +----------------+---------+-----------+---------+-----------+----------------+ Common Iliac     patent              patent                              Distal                                                                   +----------------+---------+-----------+---------+-----------+----------------+  +-------------------------+---------+-----------+---------+-----------+--------+  EIV           RT-PatentRT-ThrombusLT-PatentLT-ThrombusComments +-------------------------+---------+-----------+---------+-----------+--------+ External Iliac Vein Prox  patent              patent                      +-------------------------+---------+-----------+---------+-----------+--------+ External Iliac Vein Mid                       patent                      +-------------------------+---------+-----------+---------+-----------+--------+ External Iliac Vein       patent                         acute            Distal                                                                    +-------------------------+---------+-----------+---------+-----------+--------+   Summary: IVC/Iliac: There is evidence of acute thrombus involving the left external iliac vein.  *See table(s) above for measurements and observations.  Electronically signed by Monica Martinez MD on 05/04/2021 at 4:13:03 PM.    Final    DG CHEST PORT 1 VIEW  Result Date: 05/11/2021 CLINICAL DATA:  Cough, COVID-19 positive. EXAM:  PORTABLE CHEST 1 VIEW COMPARISON:  May 08, 2021. FINDINGS: Stable cardiomegaly. Feeding tube is seen entering stomach. Left-sided pacemaker is unchanged in position. Status post cardiac valve repair. Right-sided PICC line is unchanged. No pneumothorax is noted. Stable bilateral lung opacities are noted concerning for edema or pneumonia with small bilateral pleural effusions. Bony thorax is unremarkable. IMPRESSION: Stable support apparatus. Stable bilateral lung opacities are noted. Aortic Atherosclerosis (ICD10-I70.0). Electronically Signed   By: Marijo Conception M.D.   On: 05/11/2021 11:57   DG CHEST PORT 1 VIEW  Result Date: 05/08/2021 CLINICAL DATA:  COVID positive.  History of respiratory failure EXAM: PORTABLE CHEST 1 VIEW COMPARISON:  05/04/2021 FINDINGS: NG 2 and PICC line unchanged. Feeding tube and PICC line unchanged. Pacer alive stable cardiac silhouette. There is again demonstrated LEFT basilar atelectasis. Bilateral small effusions IMPRESSION: 1. No unchanged. 2. Stable support apparatus. 3. Bibasilar atelectasis and small effusions. Electronically Signed   By: Suzy Bouchard M.D.   On: 05/08/2021 14:15   DG Chest Port 1 View  Result Date: 05/04/2021 CLINICAL DATA:  Right PICC placement EXAM: PORTABLE CHEST 1 VIEW COMPARISON:  Chest radiograph from earlier today. FINDINGS: Right PICC terminates at the cavoatrial junction. Enteric tube enters stomach with the tip not seen on this image. Stable configuration of intact sternotomy wires, cardiac valvular prosthesis and 2 lead left subclavian ICD. Stable cardiomediastinal silhouette with mild cardiomegaly. No pneumothorax. Small bilateral pleural effusions are stable. Patchy hazy parahilar interstitial opacities, improved. Stable left basilar atelectasis. IMPRESSION: 1. Right PICC terminates at the cavoatrial junction. 2. Improved patchy hazy parahilar interstitial opacities, compatible with improving cardiogenic pulmonary edema. 3. Stable small  bilateral pleural effusions and left basilar atelectasis. Electronically Signed   By: Ilona Sorrel M.D.   On: 05/04/2021 19:10   DG CHEST PORT 1 VIEW  Result Date: 05/04/2021 CLINICAL DATA:  Hypoxia,  shortness of breath. EXAM: PORTABLE CHEST 1 VIEW COMPARISON:  05/02/2021. FINDINGS: Feeding tube is followed into the stomach with the tip projecting beyond the inferior margin of the image. Pacemaker and ICD lead tips are in the right atrium and right ventricle, respectively. Heart is enlarged, stable. Thoracic aorta is calcified. Mixed interstitial and airspace opacification persists, right greater than left, similar to 05/02/2021. Probable bilateral pleural effusions. IMPRESSION: Congestive heart failure and/or pneumonia. Electronically Signed   By: Lorin Picket M.D.   On: 05/04/2021 09:09   DG CHEST PORT 1 VIEW  Result Date: 05/02/2021 CLINICAL DATA:  Hypoxia EXAM: PORTABLE CHEST 1 VIEW COMPARISON:  04/26/2021 FINDINGS: Cardiac shadow is enlarged but stable. Defibrillator and postsurgical changes are seen. Feeding catheter is noted extending into the stomach. Lungs are well aerated with diffuse airspace opacity throughout the right lung as well as the left lung base increased in the interval from the prior exam. IMPRESSION: Developing pneumonic infiltrates bilaterally right greater than left as described. Electronically Signed   By: Inez Catalina M.D.   On: 05/02/2021 23:38   DG CHEST PORT 1 VIEW  Result Date: 04/26/2021 CLINICAL DATA:  Hypoxia EXAM: PORTABLE CHEST 1 VIEW COMPARISON:  04/25/2021 FINDINGS: Cardiac shadow is enlarged but stable. Postsurgical changes are noted. Defibrillator is again seen and stable. The lungs are well aerated bilaterally. Mild left basilar opacity is again identified and stable. Patchy right basilar opacity is noted new from the prior exam. No bony abnormality is seen. IMPRESSION: Stable left basilar opacity. New patchy right basilar opacity. Electronically Signed   By:  Inez Catalina M.D.   On: 04/26/2021 23:32   DG Chest Port 1 View  Result Date: 04/25/2021 CLINICAL DATA:  Dyspnea EXAM: PORTABLE CHEST 1 VIEW COMPARISON:  Chest radiograph dated 04/19/2021. FINDINGS: The heart remains enlarged. Vascular calcifications are seen in the aortic arch. There is a small left pleural effusion which appears increased. Mild bibasilar atelectasis/airspace disease, left greater than right, appear increased. No pneumothorax. A left subclavian approach cardiac device, median sternotomy wires, and a mitral valve replacement are redemonstrated. IMPRESSION: Increased small left pleural effusion and bibasilar atelectasis/airspace disease. Aortic Atherosclerosis (ICD10-I70.0). Electronically Signed   By: Zerita Boers M.D.   On: 04/25/2021 10:27   DG CHEST PORT 1 VIEW  Result Date: 04/19/2021 CLINICAL DATA:  75 year old male with shortness of breath. EXAM: PORTABLE CHEST 1 VIEW COMPARISON:  Portable chest 09/01/2020 and earlier. FINDINGS: Portable AP semi upright view at 0523 hours. Chronic cardiomegaly appears stable. Visualized tracheal air column is within normal limits. Chronic left chest AICD, cardiac valve replacement and sternotomy. Substantially lower lung volumes. No pneumothorax. Upper lung pulmonary vascularity appears normal. Patchy bibasilar opacity. Small pleural effusion(s) difficult to exclude. No air bronchograms. Negative visible bowel gas pattern. Stable visualized osseous structures. IMPRESSION: 1. Substantially lower lung volumes with bibasilar opacity, nonspecific but perhaps just atelectasis. Difficult to exclude small pleural effusions. 2. Cardiomegaly.  No acute pulmonary edema. Electronically Signed   By: Genevie Ann M.D.   On: 04/19/2021 08:06   DG Abd Portable 1V  Result Date: 04/29/2021 CLINICAL DATA:  Feeding tube EXAM: PORTABLE ABDOMEN - 1 VIEW COMPARISON:  09/18/2012, CT 04/24/2021 FINDINGS: Esophageal tube tip overlies the gastroduodenal region. Left-sided  percutaneous nephrostomy tube. Mild diffuse increased bowel gas without obstructive change. Airspace disease at left base. IMPRESSION: Esophageal tube tip overlies the distal stomach/gastroduodenal region Electronically Signed   By: Donavan Foil M.D.   On: 04/29/2021 19:20   ECHOCARDIOGRAM COMPLETE  Result Date: 05/05/2021    ECHOCARDIOGRAM REPORT   Patient Name:   Ryan [name] Date of Exam: 05/05/2021 Medical Rec #:  [id number]        Height:       72.0 in Accession #:    [id number]       Weight:       194.0 lb Date of Birth:  06-26-1946        BSA:          2.103 m Patient Age:    23 years         BP:           94/65 mmHg Patient Gender: M                HR:           70 bpm. Exam Location:  Inpatient Procedure: 2D Echo, Color Doppler, Cardiac Doppler and Intracardiac            Opacification Agent Indications:    Positive D dimer [[id number]]  History:        Patient has prior history of Echocardiogram examinations, most                 recent 03/04/2021. Defibrillator, PAD, Arrythmias:PVC,                 Supraventricular tachycardia and Atrial Fibrillation; Risk                 Factors:Hypertension and Diabetes. COVID. Dilated                 cardiomyopathy.                  Mitral Valve: 27 mm [employer] bioprosthetic valve valve is                 present in the mitral position. Procedure Date: 09/26/2012.  Sonographer:    Darlina Sicilian RDCS Referring Phys: [id number] [place]  1. Left ventricular ejection fraction, by estimation, is 25 to 30%. The left ventricle has severely decreased function. The left ventricle demonstrates global hypokinesis. The left ventricular internal cavity size was mildly dilated. Left ventricular diastolic parameters are indeterminate.  2. Device leads in RV/RA. Right ventricular systolic function is moderately reduced. The right ventricular size is moderately enlarged. There is moderately elevated pulmonary artery systolic pressure.  3. Left atrial size was  moderately dilated.  4. Right atrial size was mildly dilated.  5. 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 The mitral valve has been repaired/replaced. No evidence of mitral valve regurgitation. No evidence of mitral stenosis. There is a 27 mm [facility] bioprosthetic valve present in the mitral position. Procedure Date: 09/26/2012.  6. Tricuspid valve regurgitation is moderate.  7. The aortic valve is calcified. There is moderate calcification of the aortic valve. Aortic valve regurgitation is not visualized. Mild to moderate aortic valve sclerosis/calcification is present, without any evidence of aortic stenosis.  8. Aortic dilatation noted. There is mild dilatation of the aortic root, measuring 41 mm.  9. The inferior vena cava is normal in size with greater than 50% respiratory variability, suggesting right atrial pressure of 3 mmHg. FINDINGS  Left Ventricle: Left ventricular ejection fraction, by estimation, is 25 to 30%. The left ventricle has severely decreased function. The left ventricle demonstrates global hypokinesis. Definity contrast agent was given IV to delineate the left ventricular endocardial borders. The left ventricular internal cavity size was mildly  dilated. There is no left ventricular hypertrophy. Left ventricular diastolic parameters are indeterminate. Right Ventricle: Device leads in RV/RA. The right ventricular size is moderately enlarged. No increase in right ventricular wall thickness. Right ventricular systolic function is moderately reduced. There is moderately elevated pulmonary artery systolic pressure. The tricuspid regurgitant velocity is 3.28 m/s, and with an assumed right atrial pressure of 15 mmHg, the estimated right ventricular systolic pressure is 81.1 mmHg. Left Atrium: Left atrial size was moderately dilated. Right Atrium: Right atrial size was mildly dilated. Pericardium: There is no evidence of pericardial effusion. Mitral Valve: Normal functioning bioprosthetic  MVR no PVL. The mitral valve has been repaired/replaced. No evidence of mitral valve regurgitation. There is a 27 mm [facility] bioprosthetic valve present in the mitral position. Procedure Date: 09/26/2012. No evidence of mitral valve stenosis. MV peak gradient, 11.1 mmHg. The mean mitral valve gradient is 6.5 mmHg. Tricuspid Valve: The tricuspid valve is normal in structure. Tricuspid valve regurgitation is moderate . No evidence of tricuspid stenosis. Aortic Valve: The aortic valve is calcified. There is moderate calcification of the aortic valve. Aortic valve regurgitation is not visualized. Mild to moderate aortic valve sclerosis/calcification is present, without any evidence of aortic stenosis. Pulmonic Valve: The pulmonic valve was normal in structure. Pulmonic valve regurgitation is mild. No evidence of pulmonic stenosis. Aorta: The aortic root is normal in size and structure and aortic dilatation noted. There is mild dilatation of the aortic root, measuring 41 mm. Venous: The inferior vena cava is normal in size with greater than 50% respiratory variability, suggesting right atrial pressure of 3 mmHg. IAS/Shunts: No atrial level shunt detected by color flow Doppler. Additional Comments: A device lead is visualized.  LEFT VENTRICLE PLAX 2D LVIDd:         5.00 cm      Diastology LVIDs:         4.10 cm      LV e' medial:    5.30 cm/s LV PW:         0.80 cm      LV E/e' medial:  24.9 LV IVS:        1.00 cm      LV e' lateral:   7.23 cm/s LVOT diam:     2.20 cm      LV E/e' lateral: 18.3 LV SV:         57 LV SV Index:   27 LVOT Area:     3.80 cm  LV Volumes (MOD) LV vol d, MOD A2C: 111.5 ml LV vol d, MOD A4C: 92.9 ml LV vol s, MOD A2C: 87.5 ml LV vol s, MOD A4C: 48.2 ml LV SV MOD A2C:     24.0 ml LV SV MOD A4C:     92.9 ml LV SV MOD BP:      35.8 ml RIGHT VENTRICLE RV S prime:     9.66 cm/s TAPSE (M-mode): 1.8 cm LEFT ATRIUM             Index       RIGHT ATRIUM           Index LA diam:        4.50 cm 2.14  cm/m  RA Area:     24.70 cm LA Vol (A2C):   41.1 ml 19.54 ml/m RA Volume:   73.50 ml  34.95 ml/m LA Vol (A4C):   36.0 ml 17.12 ml/m LA Biplane Vol: 40.3 ml 19.16 ml/m  AORTIC VALVE  PULMONIC VALVE LVOT Vmax:   76.50 cm/s  PR End Diast Vel: 12.39 msec LVOT Vmean:  45.500 cm/s LVOT VTI:    0.150 m  AORTA Ao Root diam: 4.10 cm Ao Asc diam:  3.85 cm MITRAL VALVE                TRICUSPID VALVE MV Area (PHT): 3.17 cm     TR Peak grad:   43.0 mmHg MV Area VTI:   1.15 cm     TR Vmax:        328.00 cm/s MV Peak grad:  11.1 mmHg MV Mean grad:  6.5 mmHg     SHUNTS MV Vmax:       1.66 m/s     Systemic VTI:  0.15 m MV Vmean:      121.5 cm/s   Systemic Diam: 2.20 cm MV Decel Time: 239 msec MV E velocity: 132.00 cm/s MV A velocity: 161.00 cm/s MV E/A ratio:  0.82 Jenkins Rouge MD Electronically signed by Jenkins Rouge MD Signature Date/Time: 05/05/2021/11:57:31 AM    Final    CT RENAL STONE STUDY  Result Date: 04/19/2021 CLINICAL DATA:  Acute renal failure.  Decreased urine output. EXAM: CT ABDOMEN AND PELVIS WITHOUT CONTRAST TECHNIQUE: Multidetector CT imaging of the abdomen and pelvis was performed following the standard protocol without IV contrast. COMPARISON:  04/18/2021 FINDINGS: Lower chest: Subpleural atelectasis and consolidation noted within both lung bases. Cardiac enlargement. ICD leads are noted within the right ventricle and left atrial appendage. Hepatobiliary: No focal liver abnormality identified. Small stones are noted layering within the gallbladder. No gallbladder wall thickening or pericholecystic inflammatory fat stranding. No bile duct dilatation. Pancreas: No acute findings. Spleen: Normal in size without focal abnormality. Adrenals/Urinary Tract: Normal adrenal glands. Right kidney is unremarkable. Left-sided hydronephrosis and hydroureter identified. I suspect this is secondary to mass effect from left retroperitoneal hematoma. Urinary bladder is grossly unremarkable. Stomach/Bowel:  Stomach appears normal. There are a few mildly increased loops of small bowel which measure up to 2.7 cm. No specific findings to suggest bowel obstruction with gas and stool noted throughout the colon up to the level of the rectum. Vascular/Lymphatic: Aortic atherosclerosis. No aneurysm. No abdominopelvic adenopathy. Reproductive: Prostate is unremarkable. Other: Large left retroperitoneal hematoma is again identified. Using the same measuring technique as the previous exam this measures 16.5 x 7.8 by 16.8 cm, image 53/6 and image 57/3. Layering high attenuation material is identified consistent with subacute to acute blood products. On the previous exam this measured 18.2 x 8.5 by 17.3 cm. Intramuscular hematoma involving the left iliopsoas muscle is stable from previous exam. Small amount of mixed attenuation fluid infiltration within the presacral soft tissue space appears similar. Musculoskeletal: There is multilevel superior endplate compression deformities within the lumbar spine which appear unchanged from the previous exam and remain indeterminate. Multilevel degenerative disc disease also noted. IMPRESSION: 1. Similar appearance of large left retroperitoneal hematoma. 2. Left-sided hydronephrosis and hydroureter identified. I suspect this is secondary to mass effect from left retroperitoneal hematoma. 3. Similar appearance of intramuscular hematoma involving the left iliopsoas muscle. 4. Aortic atherosclerosis. 5. Gallstones. Aortic Atherosclerosis (ICD10-I70.0). Electronically Signed   By: Kerby Moors M.D.   On: 04/19/2021 07:00   IR NEPHROSTOMY PLACEMENT LEFT  Result Date: 04/20/2021 INDICATION: 75 year old male with spontaneous retroperitoneal hemorrhage, acute renal failure, with left-sided ureteral obstruction EXAM: ULTRASOUND-GUIDED LEFT PERCUTANEOUS NEPHROSTOMY IMAGE GUIDED TEMPORARY HEMODIALYSIS CATHETER COMPARISON:  CT 04/19/2021 MEDICATIONS: 2 g Rocephin; The antibiotic was administered  in  an appropriate time frame prior to skin puncture. ANESTHESIA/SEDATION: Fentanyl 50 mcg IV; Versed 0 mg IV Moderate Sedation Time:  0 The patient was continuously monitored during the procedure by the interventional radiology nurse under my direct supervision. CONTRAST:  70m OMNIPAQUE IOHEXOL 300 MG/ML SOLN - administered into the collecting system(s) FLUOROSCOPY TIME:  Fluoroscopy Time: 2 minutes 12 seconds (14 mGy). COMPLICATIONS: None PROCEDURE: Informed written consent was obtained from the patient's family after a thorough discussion of the procedural risks, benefits and alternatives, regarding image guided percutaneous nephrostomy. All questions were addressed. Informed written consent was also obtained from the patient's family after a discussion of the risks, benefits, and alternatives to treatment, regarding image guided hemodialysis catheter placement. Questions regarding the procedure were encouraged and answered. Patient positioned prone position on the fluoroscopy table. Ultrasound survey of the left flank was performed with images stored and sent to PACs. The patient was then prepped and draped in the usual sterile fashion. 1% lidocaine was used to anesthetize the skin and subcutaneous tissues for local anesthesia. A Chiba needle was then used to access a posterior inferior calyx with ultrasound guidance. With spontaneous urine returned through the needle, passage of an 018 micro wire into the collecting system was performed under fluoroscopy. A small incision was made with an 11 blade scalpel, and the needle was removed from the wire. An Accustick system was then advanced over the wire into the collecting system under fluoroscopy. The metal stiffener and inner dilator were removed, and then a sample of fluid was aspirated through the 4 French outer sheath. Bentson wire was passed into the collecting system and the sheath removed. Ten French dilation of the soft tissues was performed. Using modified  Seldinger technique, a 10 French pigtail catheter drain was placed over the Bentson wire. Wire and inner stiffener removed, and the pigtail was formed in the collecting system. Small amount of contrast confirmed position of the catheter. Patient tolerated the procedure well and remained hemodynamically stable throughout. No complications were encountered and no significant blood loss encountered The patient was then moved into a supine position to proceed with temporary hemodialysis catheter. The right neck was prepped with chlorhexidine in a sterile fashion, and a sterile drape was applied covering the operative field. Maximum barrier sterile technique with sterile gowns and gloves were used for the procedure. A timeout was performed prior to the initiation of the procedure A micropuncture kit was utilized to access the right internal jugular vein under direct, real-time ultrasound guidance after the overlying soft tissues were anesthetized with 1% lidocaine with epinephrine. Ultrasound image documentation was performed. The microwire was kinked to measure appropriate catheter length. A guidewire was advanced to the level of the IVC. A 15 cm hemodialysis catheter was then placed over the wire. Final catheter positioning was confirmed and documented with a spot radiographic image. The catheter aspirates and flushes normally. The catheter was flushed with appropriate volume heparin dwells. Dressings were applied. The patient tolerated the procedure well without immediate post procedural complication. IMPRESSION: Status post image guided left percutaneous nephrostomy, as well as same session image guided temporary hemodialysis catheter at the right IJ. Catheter may be converted if needed Signed, JDulcy Fanny WDellia Nims RPVI [facility] Electronically Signed   By: JCorrie MckusickD.O.   On: 04/20/2021 15:17   VAS [place] EXTREMITY VENOUS (DVT)  Result Date:  05/04/2021  Lower Venous DVT Study Patient Name:  Ryan [name]  Date of Exam:   05/04/2021 Medical Rec #: [id number]         Accession #:    [id number] Date of Birth: [birth year]         Patient Gender: M Patient Age:   48 years Exam Location:  [facility] Procedure:      VAS [place] LOWER EXTREMITY VENOUS (DVT) Referring Phys: KELLY GRIFFITH --------------------------------------------------------------------------------  Indications: Edema, and COVID.  Comparison Study: 04/22/21- negative left lower extremity venous duplex Performing Technologist: Maudry Mayhew MHA, RDMS, RVT, RDCS  Examination Guidelines: A complete evaluation includes B-mode imaging, spectral Doppler, color Doppler, and power Doppler as needed of all accessible portions of each vessel. Bilateral testing is considered an integral part of a complete examination. Limited examinations for reoccurring indications may be performed as noted. The reflux portion of the exam is performed with the patient in reverse Trendelenburg.  +---------+---------------+---------+-----------+----------+--------------+ RIGHT    CompressibilityPhasicitySpontaneityPropertiesThrombus Aging +---------+---------------+---------+-----------+----------+--------------+ CFV      Full           Yes      Yes                                 +---------+---------------+---------+-----------+----------+--------------+ SFJ      Full                                                        +---------+---------------+---------+-----------+----------+--------------+ FV Prox  Full                                                        +---------+---------------+---------+-----------+----------+--------------+ FV Mid   Full                                                        +---------+---------------+---------+-----------+----------+--------------+ FV DistalFull                                                         +---------+---------------+---------+-----------+----------+--------------+ PFV      Full                                                        +---------+---------------+---------+-----------+----------+--------------+ POP      Full           Yes      Yes                                 +---------+---------------+---------+-----------+----------+--------------+ PTV      None  Yes                  Acute          +---------+---------------+---------+-----------+----------+--------------+ PERO     None                    No                   Acute          +---------+---------------+---------+-----------+----------+--------------+   +---------+---------------+---------+-----------+----------+--------------+ LEFT     CompressibilityPhasicitySpontaneityPropertiesThrombus Aging +---------+---------------+---------+-----------+----------+--------------+ CFV      None           Yes      Yes                  Acute          +---------+---------------+---------+-----------+----------+--------------+ SFJ      None                                         Acute          +---------+---------------+---------+-----------+----------+--------------+ FV Prox  None                                         Acute          +---------+---------------+---------+-----------+----------+--------------+ FV Mid   None                                         Acute          +---------+---------------+---------+-----------+----------+--------------+ FV DistalNone                    Yes                  Acute          +---------+---------------+---------+-----------+----------+--------------+ PFV      None                                         Acute          +---------+---------------+---------+-----------+----------+--------------+ POP      None           Yes      Yes                  Acute           +---------+---------------+---------+-----------+----------+--------------+ PTV      Full                    Yes        patent                   +---------+---------------+---------+-----------+----------+--------------+ PERO     None                    No                   Acute          +---------+---------------+---------+-----------+----------+--------------+     Summary: RIGHT: - Findings consistent with acute deep vein thrombosis  involving the right posterior tibial veins, and right peroneal veins.  LEFT: - Findings consistent with acute deep vein thrombosis involving the left common femoral vein, SF junction, left femoral vein, left proximal profunda vein, left popliteal vein, and left peroneal veins. - No cystic structure found in the popliteal fossa.  *See table(s) above for measurements and observations. Electronically signed by Monica Martinez MD on 05/04/2021 at 4:15:55 PM.    Final    VAS [place] LOWER EXTREMITY VENOUS (DVT)  Result Date: 04/22/2021  Lower Venous DVT Study Patient Name:  SIDHARTH LEVERETTE  Date of Exam:   04/22/2021 Medical Rec #: [id number]         Accession #:    [id number] Date of Birth: 12-13-1945         Patient Gender: M Patient Age:   75Y Exam Location:  [facility] Procedure:      VAS [place] LOWER EXTREMITY VENOUS (DVT) Referring Phys: [id number] VASUNDHRA RATHORE --------------------------------------------------------------------------------  Indications: Left thigh pain & swelling. Other Indications: Recent LLE angiogram (04/13/21). Comparison Study: No previous exams Performing Technologist: Jody Hill RVT, RDMS  Examination Guidelines: A complete evaluation includes B-mode imaging, spectral Doppler, color Doppler, and power Doppler as needed of all accessible portions of each vessel. Bilateral testing is considered an integral part of a complete examination. Limited examinations for reoccurring indications may be performed as noted. The reflux portion of the exam is  performed with the patient in reverse Trendelenburg.  +-----+---------------+---------+-----------+----------+--------------+ RIGHTCompressibilityPhasicitySpontaneityPropertiesThrombus Aging +-----+---------------+---------+-----------+----------+--------------+ CFV  Full           Yes      Yes                                 +-----+---------------+---------+-----------+----------+--------------+   +---------+---------------+---------+-----------+----------+--------------+ LEFT     CompressibilityPhasicitySpontaneityPropertiesThrombus Aging +---------+---------------+---------+-----------+----------+--------------+ CFV      Full           Yes      Yes                                 +---------+---------------+---------+-----------+----------+--------------+ SFJ      Full                                                        +---------+---------------+---------+-----------+----------+--------------+ FV Prox  Full           Yes      Yes                                 +---------+---------------+---------+-----------+----------+--------------+ FV Mid   Full           Yes      Yes                                 +---------+---------------+---------+-----------+----------+--------------+ FV DistalFull           Yes      Yes                                 +---------+---------------+---------+-----------+----------+--------------+  PFV      Full                                                        +---------+---------------+---------+-----------+----------+--------------+ POP      Full           Yes      Yes                                 +---------+---------------+---------+-----------+----------+--------------+ PTV      Full                                                        +---------+---------------+---------+-----------+----------+--------------+ PERO     Full                                                         +---------+---------------+---------+-----------+----------+--------------+     Summary: RIGHT: - No evidence of common femoral vein obstruction.  LEFT: - There is no evidence of deep vein thrombosis in the lower extremity. - There is no evidence of superficial venous thrombosis.  - No cystic structure found in the popliteal fossa. Subcutaneous edema in area of pain (LLE mid & distal thigh).  *See table(s) above for measurements and observations. Electronically signed by Harold Barban MD on 04/22/2021 at 11:53:43 AM.    Final    VAS [place] UPPER EXTREMITY VENOUS DUPLEX  Result Date: 05/04/2021 UPPER VENOUS STUDY  Patient Name:  Ryan [name]  Date of Exam:   05/04/2021 Medical Rec #: [id number]         Accession #:    [id number] Date of Birth: December 24, 1945         Patient Gender: M Patient Age:   58 years Exam Location:  [facility] Procedure:      VAS [place] UPPER EXTREMITY VENOUS DUPLEX Referring Phys: Claiborne Billings GRIFFITH --------------------------------------------------------------------------------  Indications: Edema Other Indications: COVID. Limitations: Poor ultrasound/tissue interface. Comparison Study: No prior study Performing Technologist: Maudry Mayhew MHA, RDMS, RVT, RDCS  Examination Guidelines: A complete evaluation includes B-mode imaging, spectral Doppler, color Doppler, and power Doppler as needed of all accessible portions of each vessel. Bilateral testing is considered an integral part of a complete examination. Limited examinations for reoccurring indications may be performed as noted.  Right Findings: +----------+------------+---------+-----------+----------+-------+ RIGHT     CompressiblePhasicitySpontaneousPropertiesSummary +----------+------------+---------+-----------+----------+-------+ Subclavian               Yes       Yes                      +----------+------------+---------+-----------+----------+-------+  Left Findings:  +----------+------------+---------+-----------+----------+-------+ LEFT      CompressiblePhasicitySpontaneousPropertiesSummary +----------+------------+---------+-----------+----------+-------+ IJV           Full       Yes       Yes                      +----------+------------+---------+-----------+----------+-------+  Subclavian    None                 No                Acute  +----------+------------+---------+-----------+----------+-------+ Axillary      None                 No                Acute  +----------+------------+---------+-----------+----------+-------+ Brachial      None                 No                Acute  +----------+------------+---------+-----------+----------+-------+ Radial        None                 Yes               Acute  +----------+------------+---------+-----------+----------+-------+ Ulnar         None                 No                Acute  +----------+------------+---------+-----------+----------+-------+ Cephalic      Full                                          +----------+------------+---------+-----------+----------+-------+ Basilic       None                                   Acute  +----------+------------+---------+-----------+----------+-------+  Summary:  Right: No evidence of thrombosis in the subclavian.  Left: Findings consistent with acute deep vein thrombosis involving the left subclavian vein, left axillary vein, left brachial veins, left radial veins and left ulnar veins. Findings consistent with acute superficial vein thrombosis involving the left basilic vein.  *See table(s) above for measurements and observations.  Diagnosing physician: Monica Martinez MD Electronically signed by Monica Martinez MD on 05/04/2021 at 4:12:40 PM.    Final    [place] EKG SITE RITE  Result Date: 05/04/2021 If Site Rite image not attached, placement could not be confirmed due to current cardiac rhythm.    ASSESSMENT  AND PLAN:  1.  Thrombocytopenia 2.  Acute PE/acute lower extremity DVT 3.  COVID-19 pneumonia/multifocal pneumonia/acute respiratory failure with hypoxia 4.  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  Anemia with recent acute blood loss secondary to retroperitoneal hematoma 6.  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  CHF 8.  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  Diabetes mellitus 10.  Hypertension  -The patient has developed gradually worsening thrombocytopenia over the past few days.  His platelet count was completely normal up until 3 days ago.  He has had multiple exposures to heparin over the past few months.  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  Agree with stopping heparin and switching to argatroban.  We will follow-up on HIT panel and SRA.  Thank you for this referral.  Ryan Bussing, DNP, AGPCNP-BC, AOCNP   Attending Note  I personally saw the patient, reviewed the chart and examined the patient. The plan  of care was discussed with the patient and the admitting team. I agree with the assessment and plan as documented above. Thank you very much for the consultation.  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

## 2021-05-14 NOTE — Progress Notes (Signed)
Orthopedic Tech Progress Note Patient Details:  Ryan [name] 1946/02/28 [id number] Called in order to [facility] Patient ID: Ryan [name], male   DOB: December 24, 1945, 75 y.o.   MRN: [id number]  Chip Boer 05/14/2021, 10:25 AM

## 2021-05-14 NOTE — Progress Notes (Signed)
ANTICOAGULATION CONSULT NOTE - Follow Up Consult  Pharmacy Consult for Heparin > Argatroban Indication: pulmonary embolus and DVT, possible HIT  Allergies  Allergen Reactions   Tuna [Fish Allergy] Nausea And Vomiting    Patient Measurements: Height: 6' (182.9 cm) Weight: 79.8 kg (176 lb) IBW/kg (Calculated) : 77.6 Heparin Dosing Weight: 79.8 kg Argatroban Dosing Weight: 80 kg  Vital Signs: Temp: 98.5 F (36.9 C) (08/25 2006) Temp Source: Oral (08/25 2006) BP: 130/74 (08/25 2006) Pulse Rate: 72 (08/25 2006)  Labs: Recent Labs    05/12/21 0430 05/13/21 0444 05/14/21 0630 05/14/21 1335 05/14/21 2244  HGB 9.7* 8.6* 7.9*  --   --   HCT 31.8* 28.0* 26.0*  --   --   PLT 141* 110* 83*  --   --   APTT  --   --   --  135* 64*  HEPARINUNFRC 0.40 0.44 0.54  --   --   CREATININE  --  1.23  --   --   --      Estimated Creatinine Clearance: 55.2 mL/min (by C-G formula based on SCr of 1.23 mg/dL).  Assessment: 75 yr old man admitted on 04/08/21 with critical limb ischemic of RLE.  Pt with permanent atrial fibrillation, also with mitral valve replacement with bioprosthetic valve (on warfarin PTA for afib, on hold). Also recent dx of COVID infection. DVT prophylaxis had been on hold due to recent retroperitoneal hematoma, for which he was given vit K and FFP (repeat CT on 8/5 remained unchanged). Now with acute PE per VQ scan 8/16 and bilateral DVTs per duplex 8/15. Pharmacy was consulted for IV heparin without boluses on 8/15.   Heparin level remains therapeutic (0.55) on 1750 units/hr. Had been trying to keep at lower end of therapeutic range (0.3-0.5) with recent hematoma.. Platelet count trended down again to 83. 212K when IV heparin begun.  Day #10 IV heparin, POD#2. Hgb 9.7>8.6>7.9. No bleeding noted.  To change to Argatroban for possible HIT.  HIT antibody and SRA ordered, and to check peripheral smear. Heme/onc consulted.  8/25 PM update:  aPTT therapeutic after rate  decrease  Goal of Therapy:  Heparin level 0.3-0.5 units/ml aPTT 50-90 seconds  Monitor platelets by anticoagulation protocol: Yes   Plan:  Cont argatroban at 0.5 mcg/kg/min Confirmatory aPTT with AM labs Follow up HIT antibody, SRA, review of smear by Pathology, and Heme/Onc input.  Monitor for signs/symptoms of bleeding.  Narda Bonds, PharmD, BCPS Clinical Pharmacist Phone: [phone]

## 2021-05-14 NOTE — Progress Notes (Addendum)
ANTICOAGULATION CONSULT NOTE - Follow Up Consult  Pharmacy Consult for Heparin > Argatroban Indication: pulmonary embolus and DVT, possible HIT  Allergies  Allergen Reactions   Tuna [Fish Allergy] Nausea And Vomiting    Patient Measurements: Height: 6' (182.9 cm) Weight: 79.8 kg (176 lb) IBW/kg (Calculated) : 77.6 Heparin Dosing Weight: 79.8 kg Argatroban Dosing Weight: 80 kg  Vital Signs: Temp: 98.4 F (36.9 C) (08/25 0805) Temp Source: Oral (08/25 0805) BP: 108/62 (08/25 0805) Pulse Rate: 71 (08/25 0805)  Labs: Recent Labs    05/12/21 0430 05/13/21 0444 05/14/21 0630  HGB 9.7* 8.6* 7.9*  HCT 31.8* 28.0* 26.0*  PLT 141* 110* 83*  HEPARINUNFRC 0.40 0.44 0.54  CREATININE  --  1.23  --      Estimated Creatinine Clearance: 55.2 mL/min (by C-G formula based on SCr of 1.23 mg/dL).  Assessment: 75 yr old man admitted on 04/08/21 with critical limb ischemic of RLE.  Pt with permanent atrial fibrillation, also with mitral valve replacement with bioprosthetic valve (on warfarin PTA for afib, on hold). Also recent dx of COVID infection. DVT prophylaxis had been on hold due to recent retroperitoneal hematoma, for which he was given vit K and FFP (repeat CT on 8/5 remained unchanged). Now with acute PE per VQ scan 8/16 and bilateral DVTs per duplex 8/15. Pharmacy was consulted for IV heparin without boluses on 8/15.   Heparin level remains therapeutic (0.55) on 1750 units/hr. Had been trying to keep at lower end of therapeutic range (0.3-0.5) with recent hematoma.. Platelet count trended down again to 83. 212K when IV heparin begun.  Day #10 IV heparin, POD#2. Hgb 9.7>8.6>7.9. No bleeding noted.  To change to Argatroban for possible HIT.  HIT antibody and SRA ordered, and to check peripheral smear. Heme/onc consulted.  Goal of Therapy:  Heparin level 0.3-0.5 units/ml aPTT 50-90 seconds  Monitor platelets by anticoagulation protocol: Yes   Plan:  Stop IV heparin. Begin  Argatroban at 1 mcg/kg/min (dosing wt 80 kg) aPTT 4 hrs after Argatroban begins. Daily aPTT and CBC. Follow up HIT antibody, SRA, review of smear by Pathology, and Heme/Onc input.  Monitor for signs/symptoms of bleeding.  Arty Baumgartner, RPh 05/14/2021,9:01 AM  Addendum:  Initial aPTT is 135 seconds on 1 mcg/kg/min Argatroban.  Will hold x 1 hr then resume at 0.5 mcg/kg/min.  aPTT ~4 hrs after resuming.  Consuello Masse, RPh 05/14/2021 3:55 PM

## 2021-05-14 NOTE — Progress Notes (Signed)
[facility] PROGRESS NOTE    Progress Note  Ryan [name]  [id number] DOB: [birth year] DOA: 04/08/2021 PCP: [name], MD     Brief Narrative:   Ryan [name] is an 75 y.o. 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  In this admission the patient developed significant AKI left hydronephrosis and ATN from hypotension requiring he will dialysis transiently which has now been discontinued and retroperitoneal bleed    Assessment/Plan:   Acute respiratory failure with hypoxia: Multifactorial likely due to PE and pneumonia. Required high flow nasal cannula at 1 point which has now been decreased to 2 L of oxygen, he did receive 1 dose of Lasix on 05/08/2021. And subsequently weaned to room air.  Acute pulmonary embolism/acute lower extremity DVT: In setting of COVID infection, was initially on Coumadin which had to be discontinued due to retroperitoneal hematoma and intramuscular hemorrhage. VQ scan done on 05/08/2019 lower extremity DVT after discussing with vascular surgery he was restarted back on IV heparin. His hemoglobin is slowly trending down recheck a CBC tomorrow.  Acute thrombocytopenia: His platelets are trending lower this morning.  He is intermediate to high variability for possible HIT. We will check a HIT panel and a serotonin releasing assay. Discontinue heparin start argatroban. We will check a peripheral smear. Will consult hematology oncology.  Dysphagia possible thrush: Started on nystatin for 10 days.  Possible  aspiration: Patient completed course of IV vancomycin since then. Speech reevaluated the patient recommended a d regular diet.  COVID-19 pneumonia/multifocal pneumonia: Records of remdesivir and steroids. Resolved.  Poor oral intake: Core track was placed dietitian was consulted.  Possibly multifactorial in the setting of infection and difficulty swallowing. Clear for diet per speech continue tube feedings and wean them off as his oral intake continues to improve. Changes tube feedings to nocturnal. Document percentage of oral intake  Right critical lower limb ischemia: Vascular surgery was reconsulted and plan for surgical intervention on 05/12/2021 with an above-the-knee amputation. Further management per vascular surgery.  Acute blood loss anemia/retroperitoneal hematoma: CT scan of the abdomen pelvis on 04/18/2021 showed significantly large retroperitoneal hematoma Coumadin was stopped he was given vitamin K and FFP for reversal of his INR. CT scan on 04/24/2021 that showed unchanged. Will have to hold heparin due to drop in hemoglobin and concern about HIT, will start argatroban.  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 Patient is status post post percutaneous nephrostomy tube placement on 04/20/2021 Peak at 5.3 nephrology was consulted transiently started on hemodialysis. Creatinine has returned to baseline with good urine output, HD catheter has been discontinued.  Left hydronephrosis: Left percutaneous nephrostomy tube placed on 04/20/2021, they recommended to continue for at least 2 months and follow-up with urology as an outpatient.  Goals of care: Will need to consult hospice and Perative care to started discussing goals of care.  Chronic systolic heart failure: Appears to be euvolemic.  Permanent atrial fibrillation with a history of a  bioprosthetic mitral valve: Currently on IV heparin and amiodarone stable.  Diabetes mellitus type 2: With  an A1c of 5.4.  Essential hypertension: Continue to hold antihypertensive medication, seems stable.   Sacral decubitus pressure injury of skin stage II:  RN Pressure Injury Documentation: Pressure Injury 04/21/21 Coccyx Medial Stage 2 -  Partial thickness loss of dermis presenting as a shallow open injury with a red, pink wound bed without slough. (Active)  04/21/21 1255  Location: Coccyx  Location Orientation: Medial  Staging: Stage 2 -  Partial thickness loss of dermis presenting as a shallow open injury with a red, pink wound bed without slough.  Wound Description (Comments):   Present on Admission: No     DVT prophylaxis: IV heparin Family Communication:none Status is: Inpatient  Remains inpatient appropriate because:Hemodynamically unstable  Dispo: The patient is from: Home              Anticipated d/c is to: SNF              Patient currently is not medically stable to d/c.   Difficult to place patient No        Code Status:     Code Status Orders  (From admission, onward)           Start     Ordered   04/08/21 1211  Full code  Continuous        04/08/21 1212           Code Status History     Date Active Date Inactive Code Status Order ID Comments User Context   03/06/2021 1816 03/13/2021 0325 Full Code [id number]  Ortencia Kick Inpatient   02/25/2021 1202 02/25/2021 2000 Full Code [id number]  Cherre Robins, MD Inpatient   01/30/2021 1513 01/30/2021 2320 Full Code [id number]  Larey Dresser, MD Inpatient   08/26/2020 1818 09/06/2020 2203 Full Code [id number]  Ledora Bottcher, [place] Inpatient   06/11/2016 1627 06/12/2016 1608 Full Code [id number]  Constance Haw, MD Inpatient   08/30/2015 0114 08/30/2015 1540 Full Code [id number]  Edwin Dada, MD Inpatient   09/29/2012 1803 10/06/2012 2104 Full Code [id number]  Cephus Richer, RN Inpatient   09/26/2012 1331 09/29/2012 1803 Full Code [id number]  Lenox Ahr, RN Inpatient      Advance Directive Documentation    Flowsheet Row Most Recent Value  Type of Advance Directive Living will  Pre-existing out of facility DNR order (yellow form or pink MOST form) --  "MOST" Form in Place? --         IV Access:   Peripheral IV   Procedures and diagnostic studies:   No results found.   Medical Consultants:   None.   Subjective:    Ryan [name] minimal appetite.  Objective:    Vitals:   05/13/21 2036 05/13/21 2338 05/14/21 0406 05/14/21 0805  BP: 102/77 113/61 (!) 117/54 108/62  Pulse: 73 72 70 71  Resp: '20 16 17 16  '$ Temp: 97.7 F (36.5 C) 98.1 F (36.7 C) 98.1 F (36.7 C) 98.4 F (36.9 C)  TempSrc: Oral Oral Oral Oral  SpO2: 93% 92% 96% 94%  Weight:      Height:       SpO2: 94 % O2 Flow Rate (L/min): 3 L/min FiO2 (%): (S) 60 %   Intake/Output Summary (Last 24 hours) at 05/14/2021 0816 Last data filed at 05/14/2021 0455 Gross per 24 hour  Intake 2267.79 ml  Output 575 ml  Net 1692.79 ml    Filed Weights   05/10/21 0338 05/12/21 0446  05/13/21 0459  Weight: 83.9 kg 84.8 kg 79.8 kg    Exam: General exam: In no acute distress. Respiratory system: Good air movement and clear to auscultation. Cardiovascular system: S1 & S2 heard, RRR. No JVD. Gastrointestinal system: Abdomen is nondistended, soft and nontender.  Extremities: Right AKA Skin: No rashes, lesions or ulcers Psychiatry: Judgement and insight appear normal. Mood & affect appropriate.  Data Reviewed:    Labs: Basic Metabolic Panel: Recent Labs  Lab 05/08/21 0400 05/10/21 0407 05/13/21 0444  NA 141 141 141  K 4.3 4.6 4.6  CL 111 111 112*  CO2 '24 24 23  '$ GLUCOSE 106* 166* 98  BUN 50* 45* 31*  CREATININE 1.29* 1.13 1.23  CALCIUM 8.6* 8.7* 8.2*    GFR Estimated Creatinine Clearance: 55.2 mL/min (by C-G formula based on SCr of 1.23 mg/dL). Liver  Function Tests: No results for input(s): AST, ALT, ALKPHOS, BILITOT, PROT, ALBUMIN in the last 168 hours. No results for input(s): LIPASE, AMYLASE in the last 168 hours. No results for input(s): AMMONIA in the last 168 hours. Coagulation profile No results for input(s): INR, PROTIME in the last 168 hours. COVID-19 Labs  No results for input(s): DDIMER, FERRITIN, LDH, CRP in the last 72 hours.  Lab Results  Component Value Date   SARSCOV2NAA POSITIVE (A) 04/28/2021   [place] NEGATIVE 04/08/2021   [place] NEGATIVE 03/03/2021   [place] NEGATIVE 01/28/2021    CBC: Recent Labs  Lab 05/10/21 0407 05/11/21 0430 05/12/21 0430 05/13/21 0444 05/14/21 0630  WBC 11.7* 10.2 10.2 13.6* 10.5  HGB 9.6* 9.7* 9.7* 8.6* 7.9*  HCT 32.2* 32.0* 31.8* 28.0* 26.0*  MCV 81.7 81.6 81.3 82.4 82.8  PLT 217 192 141* 110* 83*    Cardiac Enzymes: No results for input(s): CKTOTAL, CKMB, CKMBINDEX, TROPONINI in the last 168 hours. BNP (last 3 results) Recent Labs    08/25/20 1121 03/27/21 1123  PROBNP 21,395* 3,146*    CBG: Recent Labs  Lab 05/13/21 1248 05/13/21 1540 05/13/21 2040 05/13/21 2335 05/14/21 0401  GLUCAP 130* 156* 157* 149* 137*    D-Dimer: No results for input(s): DDIMER in the last 72 hours. Hgb A1c: No results for input(s): HGBA1C in the last 72 hours. Lipid Profile: No results for input(s): CHOL, HDL, LDLCALC, TRIG, CHOLHDL, LDLDIRECT in the last 72 hours. Thyroid function studies: No results for input(s): TSH, T4TOTAL, T3FREE, THYROIDAB in the last 72 hours.  Invalid input(s): FREET3 Anemia work up: No results for input(s): VITAMINB12, FOLATE, FERRITIN, TIBC, IRON, RETICCTPCT in the last 72 hours. Sepsis Labs: Recent Labs  Lab 05/11/21 0430 05/12/21 0430 05/13/21 0444 05/14/21 0630  WBC 10.2 10.2 13.6* 10.5    Microbiology No results found for this or any previous visit (from the past 240 hour(s)).   Medications:    amiodarone  200 mg  Oral Daily   vitamin C  500 mg Oral Daily   atorvastatin  40 mg Oral q1800   benzonatate  100 mg Oral TID   Chlorhexidine Gluconate Cloth  6 each Topical Daily   dextromethorphan-guaiFENesin  1 tablet Oral BID   docusate sodium  100 mg Oral BID   feeding supplement  237 mL Oral BID BM   feeding supplement (OSMOLITE 1.5 CAL)  1,050 mL Per Tube Q24H   feeding supplement (PROSource TF)  45 mL Per Tube QID   insulin aspart  0-15 Units Subcutaneous Q4H   midodrine  10 mg Oral TID WC   multivitamin with minerals  1 tablet Oral  Daily   mupirocin ointment  1 application Nasal BID   nystatin  5 mL Oral QID   pantoprazole  40 mg Oral Daily   sodium chloride flush  10-40 mL Intracatheter Q12H   sodium chloride flush  3 mL Intravenous Q12H   zinc sulfate  220 mg Oral Daily   Continuous Infusions:  heparin 1,750 Units/hr (05/14/21 0455)      LOS: 36 days   [facility]  05/14/2021, 8:16 AM

## 2021-05-14 NOTE — Progress Notes (Signed)
Palliative Care Progress Note  I have reviewed medical records including EPIC notes, labs and imaging, received report from bedside RN Olin Hauser, and called patient's next of kin and decision makers  - patient's son [name] and  patient's daughter in law Clara to discuss diagnosis prognosis, [place], EOL wishes, disposition and options.  I attempted to elicit values and goals of care important to the patient and family. Clara shared again with me that the patient and family wants Mr. Kopecky to remain a full code and full scope of treatment. She reiterated that she has the contact information for the palliative team and they will call if anything changes. She appreciated that I touched base with her and had no questions or concerns at this time.      The family was again encouraged to call with questions or concerns.    Palliative Medicine will continue to follow the patient's hospitalization and await for family contact if [place] change.  [place] Ilsa Iha, FNP-BC Palliative Medicine Team Team Phone # [phone] Pager # [phone]   NO CHARGE

## 2021-05-14 NOTE — Progress Notes (Addendum)
  Progress Note    05/14/2021 8:21 AM 2 Days Post-Op  Subjective:  right aka tender   Vitals:   05/14/21 0406 05/14/21 0805  BP: (!) 117/54 108/62  Pulse: 70 71  Resp: 17 16  Temp: 98.1 F (36.7 C) 98.4 F (36.9 C)  SpO2: 96% 94%   Physical Exam: Cardiac:  regular Lungs:  non labored Incisions:  right aka intact and well appearing, no fluid collections, flaps viable Extremities:  RLE well perfused and warm Neurologic: alert and oriented  CBC    Component Value Date/Time   WBC 10.5 05/14/2021 0630   RBC 3.14 (L) 05/14/2021 0630   HGB 7.9 (L) 05/14/2021 0630   HGB 9.6 (L) 03/27/2021 1123   HCT 26.0 (L) 05/14/2021 0630   HCT 30.4 (L) 03/27/2021 1123   PLT 83 (L) 05/14/2021 0630   PLT 362 03/27/2021 1123   MCV 82.8 05/14/2021 0630   MCV 77 (L) 03/27/2021 1123   MCH 25.2 (L) 05/14/2021 0630   MCHC 30.4 05/14/2021 0630   RDW 24.6 (H) 05/14/2021 0630   RDW 17.2 (H) 03/27/2021 1123   LYMPHSABS 1,363 06/01/2016 1414   MONOABS 517 06/01/2016 1414   EOSABS 141 06/01/2016 1414   BASOSABS 47 06/01/2016 1414    BMET    Component Value Date/Time   NA 141 05/13/2021 0444   NA 142 03/27/2021 1123   K 4.6 05/13/2021 0444   CL 112 (H) 05/13/2021 0444   CO2 23 05/13/2021 0444   GLUCOSE 98 05/13/2021 0444   BUN 31 (H) 05/13/2021 0444   BUN 17 03/27/2021 1123   CREATININE 1.23 05/13/2021 0444   CREATININE 1.17 06/01/2016 1414   CALCIUM 8.2 (L) 05/13/2021 0444   GFRNONAA >60 05/13/2021 0444   GFRAA 39 (L) 10/01/2020 1157    INR    Component Value Date/Time   INR 1.4 (H) 05/01/2021 0715     Intake/Output Summary (Last 24 hours) at 05/14/2021 [id number] Last data filed at 05/14/2021 0455 Gross per 24 hour  Intake 2267.79 ml  Output 575 ml  Net 1692.79 ml     Assessment/Plan:  75 y.o. male is s/p right aka 2 Days Post-Op   Right aka intact and flaps appear viable Will order stump stocking Hemodynamically stable PT recommending SNF Continue therapies Will have  follow up  in office for staples removal in 4 weeks in office  Karoline Caldwell, PA-C [facility] [phone] 05/14/2021 8:21 AM  VASCULAR STAFF ADDENDUM: I agree with the above.   [name]. [name], MD [facility] Phone Number: [phone] 05/14/2021 11:02 AM

## 2021-05-15 DIAGNOSIS — J9601 Acute respiratory failure with hypoxia: Secondary | ICD-10-CM | POA: Diagnosis not present

## 2021-05-15 DIAGNOSIS — I10 Essential (primary) hypertension: Secondary | ICD-10-CM | POA: Diagnosis not present

## 2021-05-15 DIAGNOSIS — I5022 Chronic systolic (congestive) heart failure: Secondary | ICD-10-CM | POA: Diagnosis not present

## 2021-05-15 DIAGNOSIS — I70229 Atherosclerosis of native arteries of extremities with rest pain, unspecified extremity: Secondary | ICD-10-CM | POA: Diagnosis not present

## 2021-05-15 LAB — GLUCOSE, CAPILLARY
Glucose-Capillary: 112 mg/dL — ABNORMAL HIGH (ref 70–99)
Glucose-Capillary: 113 mg/dL — ABNORMAL HIGH (ref 70–99)
Glucose-Capillary: 68 mg/dL — ABNORMAL LOW (ref 70–99)
Glucose-Capillary: 78 mg/dL (ref 70–99)
Glucose-Capillary: 86 mg/dL (ref 70–99)
Glucose-Capillary: 94 mg/dL (ref 70–99)

## 2021-05-15 LAB — CBC
HCT: 26.3 % — ABNORMAL LOW (ref 39.0–52.0)
Hemoglobin: 8 g/dL — ABNORMAL LOW (ref 13.0–17.0)
MCH: 24.9 pg — ABNORMAL LOW (ref 26.0–34.0)
MCHC: 30.4 g/dL (ref 30.0–36.0)
MCV: 81.9 fL (ref 80.0–100.0)
Platelets: 81 10*3/uL — ABNORMAL LOW (ref 150–400)
RBC: 3.21 MIL/uL — ABNORMAL LOW (ref 4.22–5.81)
RDW: 24.3 % — ABNORMAL HIGH (ref 11.5–15.5)
WBC: 10.6 10*3/uL — ABNORMAL HIGH (ref 4.0–10.5)
nRBC: 0 % (ref 0.0–0.2)

## 2021-05-15 LAB — HEPARIN INDUCED PLATELET AB (HIT ANTIBODY): Heparin Induced Plt Ab: 0.285 OD (ref 0.000–0.400)

## 2021-05-15 LAB — PATHOLOGIST SMEAR REVIEW

## 2021-05-15 LAB — APTT: aPTT: 62 seconds — ABNORMAL HIGH (ref 24–36)

## 2021-05-15 LAB — SURGICAL PATHOLOGY

## 2021-05-15 MED ORDER — NYSTATIN 100000 UNIT/ML MT SUSP
5.0000 mL | Freq: Four times a day (QID) | OROMUCOSAL | Status: AC
  Administered 2021-05-15 (×4): 500000 [IU] via ORAL
  Filled 2021-05-15 (×4): qty 5

## 2021-05-15 MED ORDER — ENSURE ENLIVE PO LIQD
237.0000 mL | Freq: Three times a day (TID) | ORAL | Status: DC
  Administered 2021-05-15 – 2021-05-22 (×20): 237 mL via ORAL

## 2021-05-15 NOTE — Progress Notes (Signed)
Unable to run night feed due to clog.  Attempted with 2 RNs to clear using wire and various flushes.  Unable to clear clog.  Feeding turned off.  Will inform night RN

## 2021-05-15 NOTE — Progress Notes (Signed)
Orthopedic Tech Progress Note Patient Details:  Ryan [name] 29-Oct-1945 [id number]  Ortho Devices Type of Ortho Device: Prafo boot/shoe Ortho Device/Splint Location: Left foot Ortho Device/Splint Interventions: Application   Post Interventions Patient Tolerated: Well  Ryan [name] 05/15/2021, 6:28 PM

## 2021-05-15 NOTE — Progress Notes (Signed)
While assisting in a bath this AM, the nurse saw that the Cortrak in the pt's nose had come out too far. Since the tube was clogged and unable to use this evening, the nurse removed the Cortrack tube.  Will continue to monitor.  Lupita Dawn, RN

## 2021-05-15 NOTE — Progress Notes (Signed)
[facility] PROGRESS NOTE    Progress Note  Ryan [name]  [id number] DOB: 1946/07/18 DOA: 04/08/2021 PCP: [name], MD     Brief Narrative:   Ryan [name] is an 75 y.o. 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  In this admission the patient developed significant AKI left hydronephrosis and ATN from hypotension requiring he will dialysis transiently which has now been discontinued and retroperitoneal bleed  Assessment/Plan:   Acute respiratory failure with hypoxia: Multifactorial likely due to PE and pneumonia. Required high flow nasal cannula, we have been able to wean him to room air.    Acute pulmonary embolism/acute lower extremity DVT: In setting of COVID infection, was initially on Coumadin which had to be discontinued due to retroperitoneal hematoma and intramuscular hemorrhage. VQ scan done on 05/08/2019 lower extremity DVT after discussing with vascular surgery he was restarted back on IV heparin.  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 Hemoglobin stable continue to monitor.  Acute thrombocytopenia: His platelets are trending lower this morning.  He is intermediate to high probability for possible HIT. We will check a HIT panel and a serotonin releasing assay. Discontinue heparin start argatroban.  Oncology has been consulted and agree with current plan appreciate assistance.  Dysphagia possible thrush: Started on nystatin for 10 days.  Possible aspiration: Patient completed  course of IV vancomycin since then. Speech reevaluated the patient recommended a d regular diet.  COVID-19 pneumonia/multifocal pneumonia: Records of remdesivir and steroids. Resolved.  Poor oral intake: Core track was placed temporarily as he had poor oral intake as it improved culture was discontinued and he is tolerating a regular diet.  Right critical lower limb ischemia: Vascular surgery was reconsulted and plan for surgical intervention on 05/12/2021 with an above-the-knee amputation. Further management per vascular surgery.  Acute blood loss anemia/retroperitoneal hematoma: CT scan of the abdomen pelvis on 04/18/2021 showed significantly large retroperitoneal hematoma Coumadin was stopped he was given vitamin K and FFP for reversal of his INR. CT scan on 04/24/2021 that showed unchanged.  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 Patient is status post post percutaneous nephrostomy tube placement on 04/20/2021 Peak at 5.3 nephrology was consulted transiently started on hemodialysis. Creatinine has returned to baseline with good urine output, HD catheter has been discontinued.  Left hydronephrosis: Left percutaneous nephrostomy tube placed on 04/20/2021, they recommended to continue for at least 2 months and follow-up with urology as an outpatient.  Goals of care: Will need to consult hospice and Perative care to started discussing goals of care.  Chronic systolic heart failure: Appears to be euvolemic.  Permanent atrial fibrillation with a history of a bioprosthetic mitral valve: Currently on IV heparin and amiodarone stable.  Diabetes mellitus type 2: With an A1c of 5.4.  Essential hypertension: Continue to hold antihypertensive medication, seems stable.   Sacral decubitus pressure injury of skin stage II:  RN Pressure Injury  Documentation: Pressure Injury 04/21/21 Coccyx Medial Stage 2 -  Partial thickness loss of dermis presenting as a shallow  open injury with a red, pink wound bed without slough. (Active)  04/21/21 1255  Location: Coccyx  Location Orientation: Medial  Staging: Stage 2 -  Partial thickness loss of dermis presenting as a shallow open injury with a red, pink wound bed without slough.  Wound Description (Comments):   Present on Admission: No     DVT prophylaxis: IV heparin Family Communication:none Status is: Inpatient  Remains inpatient appropriate because:Hemodynamically unstable  Dispo: The patient is from: Home              Anticipated d/c is to: SNF              Patient currently is not medically stable to d/c.   Difficult to place patient No        Code Status:     Code Status Orders  (From admission, onward)           Start     Ordered   04/08/21 1211  Full code  Continuous        04/08/21 1212           Code Status History     Date Active Date Inactive Code Status Order ID Comments User Context   03/06/2021 1816 03/13/2021 0325 Full Code [id number]  Ortencia Kick Inpatient   02/25/2021 1202 02/25/2021 2000 Full Code [id number]  Cherre Robins, MD Inpatient   01/30/2021 1513 01/30/2021 2320 Full Code [id number]  Larey Dresser, MD Inpatient   08/26/2020 1818 09/06/2020 2203 Full Code [id number]  Ledora Bottcher, [place] Inpatient   06/11/2016 1627 06/12/2016 1608 Full Code [id number]  Constance Haw, MD Inpatient   08/30/2015 0114 08/30/2015 1540 Full Code [id number]  Edwin Dada, MD Inpatient   09/29/2012 1803 10/06/2012 2104 Full Code [id number]  Cephus Richer, RN Inpatient   09/26/2012 1331 09/29/2012 1803 Full Code [id number]  Lenox Ahr, RN Inpatient      Advance Directive Documentation    Flowsheet Row Most Recent Value  Type of Advance Directive Living will  Pre-existing out of facility DNR order (yellow form or pink MOST form)  --  "MOST" Form in Place? --         IV Access:   Peripheral IV   Procedures and diagnostic studies:   No results found.   Medical Consultants:   None.   Subjective:    Linna Hoff left eye significantly improved tolerating his diet this morning.  Objective:    Vitals:   05/15/21 0009 05/15/21 0209 05/15/21 0409 05/15/21 0609  BP:   (!) 136/59   Pulse: 70 68 67 66  Resp:   18   Temp:   98 F (36.7 C)   TempSrc:   Oral   SpO2: 95% 95% 93% 93%  Weight:   79.8 kg   Height:       SpO2: 93 % O2 Flow Rate (L/min): 3 L/min FiO2 (%): (S) 60 %   Intake/Output Summary (Last 24 hours) at 05/15/2021 0836 Last data filed at 05/15/2021 0505 Gross per 24 hour  Intake 910.23 ml  Output 1050 ml  Net -139.77 ml    Filed Weights   05/12/21 0446 05/13/21 0459 05/15/21 0409  Weight: 84.8 kg 79.8 kg 79.8 kg    Exam: General exam: In no acute distress. Respiratory system: Good air movement and clear to auscultation. Cardiovascular system: S1 & S2 heard, RRR. No JVD. Gastrointestinal system: Abdomen is nondistended, soft and nontender.  Extremities:  Right AKA Skin: No rashes, lesions or ulcers Psychiatry: Judgement and insight appear normal. Mood & affect appropriate.  Data Reviewed:    Labs: Basic Metabolic Panel: Recent Labs  Lab 05/10/21 0407 05/13/21 0444  NA 141 141  K 4.6 4.6  CL 111 112*  CO2 24 23  GLUCOSE 166* 98  BUN 45* 31*  CREATININE 1.13 1.23  CALCIUM 8.7* 8.2*    GFR Estimated Creatinine Clearance: 55.2 mL/min (by C-G formula based on SCr of 1.23 mg/dL). Liver Function Tests: No results for input(s): AST, ALT, ALKPHOS, BILITOT, PROT, ALBUMIN in the last 168 hours. No results for input(s): LIPASE, AMYLASE in the last 168 hours. No results for input(s): AMMONIA in the last 168 hours. Coagulation profile No results for input(s): INR, PROTIME in the last 168 hours. COVID-19 Labs  No results for input(s): DDIMER, FERRITIN, LDH,  CRP in the last 72 hours.  Lab Results  Component Value Date   SARSCOV2NAA POSITIVE (A) 04/28/2021   [place] NEGATIVE 04/08/2021   [place] NEGATIVE 03/03/2021   [place] NEGATIVE 01/28/2021    CBC: Recent Labs  Lab 05/11/21 0430 05/12/21 0430 05/13/21 0444 05/14/21 0630 05/15/21 0255  WBC 10.2 10.2 13.6* 10.5 10.6*  HGB 9.7* 9.7* 8.6* 7.9* 8.0*  HCT 32.0* 31.8* 28.0* 26.0* 26.3*  MCV 81.6 81.3 82.4 82.8 81.9  PLT 192 141* 110* 83* 81*    Cardiac Enzymes: No results for input(s): CKTOTAL, CKMB, CKMBINDEX, TROPONINI in the last 168 hours. BNP (last 3 results) Recent Labs    08/25/20 1121 03/27/21 1123  PROBNP 21,395* 3,146*    CBG: Recent Labs  Lab 05/14/21 1048 05/14/21 1631 05/14/21 2048 05/14/21 2342 05/15/21 0406  GLUCAP 119* 113* 101* 83 78    D-Dimer: No results for input(s): DDIMER in the last 72 hours. Hgb A1c: No results for input(s): HGBA1C in the last 72 hours. Lipid Profile: No results for input(s): CHOL, HDL, LDLCALC, TRIG, CHOLHDL, LDLDIRECT in the last 72 hours. Thyroid function studies: No results for input(s): TSH, T4TOTAL, T3FREE, THYROIDAB in the last 72 hours.  Invalid input(s): FREET3 Anemia work up: No results for input(s): VITAMINB12, FOLATE, FERRITIN, TIBC, IRON, RETICCTPCT in the last 72 hours. Sepsis Labs: Recent Labs  Lab 05/12/21 0430 05/13/21 0444 05/14/21 0630 05/15/21 0255  WBC 10.2 13.6* 10.5 10.6*    Microbiology No results found for this or any previous visit (from the past 240 hour(s)).   Medications:    amiodarone  200 mg Oral Daily   vitamin C  500 mg Oral Daily   atorvastatin  40 mg Oral q1800   benzonatate  100 mg Oral TID   Chlorhexidine Gluconate Cloth  6 each Topical Daily   dextromethorphan-guaiFENesin  1 tablet Oral BID   docusate sodium  100 mg Oral BID   feeding supplement  237 mL Oral BID BM   feeding supplement (OSMOLITE 1.5 CAL)  1,050 mL Per Tube Q24H   feeding supplement  (PROSource TF)  45 mL Per Tube QID   insulin aspart  0-15 Units Subcutaneous Q4H   midodrine  10 mg Oral TID WC   multivitamin with minerals  1 tablet Oral Daily   mupirocin ointment  1 application Nasal BID   nystatin  5 mL Oral QID   pantoprazole  40 mg Oral Daily   sodium chloride flush  10-40 mL Intracatheter Q12H   sodium chloride flush  3 mL Intravenous Q12H   sodium chloride flush  3 mL Intravenous Q12H  zinc sulfate  220 mg Oral Daily   Continuous Infusions:  sodium chloride     argatroban 0.5 mcg/kg/min (05/15/21 0417)      LOS: 37 days   Charlynne Cousins  [facility]  05/15/2021, 8:36 AM

## 2021-05-15 NOTE — Progress Notes (Signed)
ANTICOAGULATION CONSULT NOTE - Follow Up Consult  Pharmacy Consult for Argatroban Indication: pulmonary embolus, DVT, and possible HIT  Allergies  Allergen Reactions   Tuna [Fish Allergy] Nausea And Vomiting    Patient Measurements: Height: 6' (182.9 cm) Weight: 79.8 kg (175 lb 14.8 oz) IBW/kg (Calculated) : 77.6 Argatroban Dosing Weight: 80 kg  Vital Signs: Temp: 98 F (36.7 C) (08/26 0409) Temp Source: Oral (08/26 0409) BP: 136/59 (08/26 0409) Pulse Rate: 66 (08/26 0609)  Labs: Recent Labs    05/13/21 0444 05/14/21 0630 05/14/21 1335 05/14/21 2244 05/15/21 0255  HGB 8.6* 7.9*  --   --  8.0*  HCT 28.0* 26.0*  --   --  26.3*  PLT 110* 83*  --   --  81*  APTT  --   --  135* 64* 62*  HEPARINUNFRC 0.44 0.54  --   --   --   CREATININE 1.23  --   --   --   --     Estimated Creatinine Clearance: 55.2 mL/min (by C-G formula based on SCr of 1.23 mg/dL).  Assessment: 75 yr old man admitted on 04/08/21 with critical limb ischemic of RLE.  Pt with permanent atrial fibrillation, also with mitral valve replacement with bioprosthetic valve (on warfarin PTA for afib, on hold). Also recent dx of COVID infection. DVT prophylaxis had been on hold due to recent retroperitoneal hematoma, for which he was given vit K and FFP (repeat CT on 8/5 remained unchanged). Now with acute PE per VQ scan 8/16 and bilateral DVTs per duplex 8/15. Pharmacy was consulted for IV heparin without boluses on 8/15.     Platelet count dropped to 83K on 8/25 and IV heparin changed to Argatroban on 8/25. Platelet count 212K when IV heparin begun 8/15. HIT antibody and SRA sent and in process. Peripheral smear review also pending. Heme/Onc consulted.      aPTT is therapeutic (62 seconds) on Argatroban at 0.5 mcg/kg/min. Had been trying to keep heparin levels at lower end of therapeutic range (0.3-0.5) with recent hematoma, and will try to keep aPTTs at lower end of therapeutic range.  Platelet count 83 > 81K.  Hemoglobin low stable. No bleeding reported.   Goal of Therapy:  aPTT 50-90 seconds (try for 50-70 sec with recent hematoma) Monitor platelets by anticoagulation protocol: Yes   Plan:  Continue Argatroban at 0.5 mcg/kg/min Daily aPTT and CBC. Follow up HIT antibody, SRA, review of smear by Pathology, and Heme/Onc input.  Monitor for signs/symptoms of bleeding.  Arty Baumgartner, RPh 05/15/2021,8:28 AM

## 2021-05-15 NOTE — Progress Notes (Signed)
Nutrition Follow Up  DOCUMENTATION CODES:   Not applicable  INTERVENTION:   Recommend GOC discussion regarding artifical nutrition and long term feeding tube as patient is day 26 without adequate PO nutrition. If family desires full scope, recommend replacing Cortrak until decision is made regarding long term tube.   Continue dysphagia 2 diet  Continue Ensure Enlive po TID, each supplement provides 350 kcal and 20 grams of protein Continue MVI with minerals daily   NUTRITION DIAGNOSIS:   Increased nutrient needs related to post-op healing as evidenced by estimated needs.  Ongoing  GOAL:   Patient will meet greater than or equal to 90% of their needs  Not meeting  MONITOR:   PO intake, Supplement acceptance, Diet advancement, Labs, Weight trends, Skin, I & O's  REASON FOR ASSESSMENT:   Consult Assessment of nutrition requirement/status, Wound healing  ASSESSMENT:   Ryan [name] is a 75 y.o. male with medical history significant of afib; MVR; chronic systolic CHF with AICD in place; DM; HTN; and PVD presenting for foot debridement/possible BKA.    He has had difficulty with a heel ulcer, not healing up well.  He was coming in today for surgery to have it cleaned up, possible amputation.  Unable to do the surgery because his INR was too high.  He takes Coumadin for afib.  Some days he feels ok and some he doesn't.  He has not feeling like he had a fever. Pt admitted rt foot wounds with critical limb ischemia.   7/21- s/p first/second R toe amputations, R heel excisional debridement, VAC placement 7/25- s/p LLE angiography 8/01- s/p nephrostomy tube, start iHD for AKI 8/03- underwent iHD 8/10- Cortrak placed, TF started  8/23- s/p R AKA  Cortrak pulled out by patient this am. Intake remains inadequate. Last three meals documented as 0-30%. Taking supplements intermittently. Family continues to provide food from home and tries to feed patient. Patient's appetite remains  poor despite this.   Palliative met with family to discuss medical goals of care. They wish full scope/full treatment. Given patient has not had adequate PO nutrition in 26 days, recommend discussion regarding long term feeding tube and replacement of Cortrak in the meantime.   UOP: 1050 ml x 24 hrs   Medications: vitamin C 500 mg, colace, SS novolog, zinc sulfate 220 mg  Labs: CBG 83-113  Diet Order:   Diet Order             DIET DYS 2 Room service appropriate? No; Fluid consistency: Thin  Diet effective now                   EDUCATION NEEDS:   Education needs have been addressed  Skin:  Skin Assessment: Skin Integrity Issues: Skin Integrity Issues:: Other (Comment) Other: L heel and L flank  Stage II: medial coccyx Incision: s/p R AKA  Last BM:  8/23  Height:   Ht Readings from Last 1 Encounters:  05/10/21 6' (1.829 m)    Weight:   Wt Readings from Last 1 Encounters:  05/15/21 79.8 kg    Ideal Body Weight:  80.9 kg  BMI:  Body mass index is 23.86 kg/m.  Estimated Nutritional Needs:   Kcal:  2200-2400  Protein:  105-120 grams  Fluid:  > 2 L  Bahja Bence MS, RD, LDN, CNSC Clinical Nutrition Pager listed in [place]

## 2021-05-15 NOTE — Progress Notes (Signed)
Physical Therapy Treatment Patient Details Name: Ryan [name] MRN: [id number] DOB: August 06, 1946 Today's Date: 05/15/2021    History of Present Illness 75 y.o. male admitted 04/08/21 for R foot debridement and now R AK amp on 8/23.  R IJ temp HD catheter placement on 8/1.  Pt with L sided hydronephrosis possibly secondary to L retroperitoneal hematoma, s/p nephrostomy tube on 8/1 to drain. Now on airborne/contact precautions, positive Covid-19 test collected 8/9.  PMH: Afib on Coumadin mitral valve replacement, chronic systolic heart failure with an AICD and placed, essential HTN, PVD,  peripheral vascular bypass.    PT Comments    Pt received in supine, A&O x1-2 and agreeable to therapy session, with good participation and tolerance for bed mobility and seated exercise/balance activity. Pt impulsive due to confusion and needs maxA for bed mobility and min guard for safety with seated tasks due to cognition. Pt unable to progress seated scooting this date due to confusion/deconditioning and RLE pain, will plan to assess slide board transfers to chair with +2 next session if tolerated. In meantime, recommend hoyer lift to chair for any OOB mobility. Pt continues to benefit from PT services to progress toward functional mobility goals.   Follow Up Recommendations  SNF     Equipment Recommendations  Wheelchair (measurements PT);Wheelchair cushion (measurements PT);Other (comment) (likely will need slide board vs hoyer lift pending progress)    Recommendations for Other Services       Precautions / Restrictions Precautions Precautions: Fall Precaution Comments: Airborne/Contact precs, L nephrostomy tube/drain, AMS Restrictions Weight Bearing Restrictions: Yes RLE Weight Bearing: Non weight bearing    Mobility  Bed Mobility Overal bed mobility: Needs Assistance Bed Mobility: Rolling;Sidelying to Sit;Sit to Sidelying Rolling: Mod assist Sidelying to sit: Max assist;HOB elevated      Sit to sidelying: Max assist General bed mobility comments: max cues for technique, pt having difficulty following 1-step instructions and needs heavy lift assist to raise trunk    Transfers                 General transfer comment: pt unable to scoot without max/totalA along EOB and unable to lift hips off bed and also fatigued after seated activity ~5 mins so unsafe to attempt standing this date  Ambulation/Gait             General Gait Details: unable   Stairs             Wheelchair Mobility    Modified Rankin (Stroke Patients Only)       Balance Overall balance assessment: Needs assistance Sitting-balance support: Feet supported Sitting balance-Leahy Scale: Poor Sitting balance - Comments: Supervision to minA for seated balance, pt impulsively falling backward when returning to supine rather than keeping hand on rail for sidelying and needs maxA to guide back into bed. Pt tolerated EOB sitting ~8 mins total.                                    Cognition Arousal/Alertness: Awake/alert Behavior During Therapy: Impulsive Overall Cognitive Status: No family/caregiver present to determine baseline cognitive functioning Area of Impairment: Following commands;Safety/judgement;Awareness;Problem solving;Orientation;Attention;Memory                 Orientation Level: Time;Situation Current Attention Level: Focused Memory: Decreased recall of precautions;Decreased short-term memory Following Commands: Follows one step commands inconsistently;Follows one step commands with increased time Safety/Judgement: Decreased awareness of safety;Decreased awareness  of deficits Awareness: Intellectual Problem Solving: Slow processing;Decreased initiation;Requires verbal cues;Requires tactile cues;Difficulty sequencing General Comments: pt oriented to self, at times making tangential or nonsensical statements and possibly having delirium (pt seeing  glasses on floor when no items were on floor he was pointing at). Pt assisted to don glasses from tray table and seeming to see better after this time but remains confused. Pt following ~75% of simple 1-step cues but needs repetition of some cues due to confusion and slow processing.      Exercises General Exercises - Lower Extremity Ankle Circles/Pumps: AROM;Left;10 reps;Seated Long Arc Quad: AROM;AAROM;Left;Seated;10 reps Hip Flexion/Marching: AROM;Left;10 reps;Seated    General Comments General comments (skin integrity, edema, etc.): Pt needs reminder for pursed-lip breathing and frequently coughing, c/o some pain in lungs (Covid+)      Pertinent Vitals/Pain Pain Assessment: Faces Faces Pain Scale: Hurts even more Pain Location: R AKA, groin/buttocks when attempting to scoot Pain Descriptors / Indicators: Operative site guarding;Guarding;Grimacing;Tender Pain Intervention(s): Limited activity within patient's tolerance;Monitored during session;Repositioned    Home Living                      Prior Function            PT Goals (current goals can now be found in the care plan section) Acute Rehab PT Goals Patient Stated Goal: to hurt less PT Goal Formulation: With patient Time For Goal Achievement: 05/20/21 Potential to Achieve Goals: Fair Progress towards PT goals: Progressing toward goals (slow progress)    Frequency    Min 2X/week      PT Plan Current plan remains appropriate       AM-PAC PT "6 Clicks" Mobility   Outcome Measure  Help needed turning from your back to your side while in a flat bed without using bedrails?: A Lot Help needed moving from lying on your back to sitting on the side of a flat bed without using bedrails?: A Lot Help needed moving to and from a bed to a chair (including a wheelchair)?: Total Help needed standing up from a chair using your arms (e.g., wheelchair or bedside chair)?: Total Help needed to walk in hospital room?:  Total Help needed climbing 3-5 steps with a railing? : Total 6 Click Score: 8    End of Session   Activity Tolerance: Patient limited by fatigue;Patient limited by pain Patient left: in bed;with call bell/phone within reach;with bed alarm set Nurse Communication: Mobility status;Other (comment) (pt needs multi-podus boot (fuzzy gray AFO) for proper LLE neutral positioning in bed, tending to externally rotate L hip at rest) PT Visit Diagnosis: Other abnormalities of gait and mobility (R26.89);Muscle weakness (generalized) (M62.81);Pain Pain - Right/Left: Right Pain - part of body: Leg     Time: [id number] PT Time Calculation (min) (ACUTE ONLY): 31 min  Charges:  $Therapeutic Exercise: 8-22 mins $Therapeutic Activity: 8-22 mins                     Naji Mehringer P., PTA Acute Rehabilitation Services Pager: [phone] Office: [place] 05/15/2021, 3:42 PM

## 2021-05-16 LAB — CBC
HCT: 24.8 % — ABNORMAL LOW (ref 39.0–52.0)
Hemoglobin: 7.7 g/dL — ABNORMAL LOW (ref 13.0–17.0)
MCH: 25 pg — ABNORMAL LOW (ref 26.0–34.0)
MCHC: 31 g/dL (ref 30.0–36.0)
MCV: 80.5 fL (ref 80.0–100.0)
Platelets: 92 10*3/uL — ABNORMAL LOW (ref 150–400)
RBC: 3.08 MIL/uL — ABNORMAL LOW (ref 4.22–5.81)
RDW: 24.1 % — ABNORMAL HIGH (ref 11.5–15.5)
WBC: 9.9 10*3/uL (ref 4.0–10.5)
nRBC: 0 % (ref 0.0–0.2)

## 2021-05-16 LAB — GLUCOSE, CAPILLARY
Glucose-Capillary: 76 mg/dL (ref 70–99)
Glucose-Capillary: 94 mg/dL (ref 70–99)
Glucose-Capillary: 119 mg/dL — ABNORMAL HIGH (ref 70–99)
Glucose-Capillary: 91 mg/dL (ref 70–99)

## 2021-05-16 LAB — APTT: aPTT: 60 seconds — ABNORMAL HIGH (ref 24–36)

## 2021-05-16 NOTE — Progress Notes (Signed)
[facility] PROGRESS NOTE    Progress Note  Ryan [name]  [id number] DOB: May 24, 1946 DOA: 04/08/2021 PCP: [name], MD     Brief Narrative:   Ryan [name] is an 75 y.o. 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  In this admission the patient developed significant AKI left hydronephrosis and ATN from hypotension requiring he will dialysis transiently which has now been discontinued and retroperitoneal bleed  Assessment/Plan:   Acute respiratory failure with hypoxia: Multifactorial likely due to PE and bacterial pneumonia versus COVID-19 infection Required high flow nasal cannula, we have been able to wean him to room air.    Acute pulmonary embolism/acute lower extremity DVT: In setting of COVID infection, was initially on Coumadin which had to be discontinued due to retroperitoneal hematoma and intramuscular hemorrhage. VQ scan done on 05/08/2019 lower extremity DVT after discussing with vascular surgery he was restarted back on IV heparin.  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 Hemoglobin stable continue to monitor.  Acute thrombocytopenia: His platelets this morning are trending up. IV heparin has been discontinued, now on argatroban. HIT panel and serotonin release and ASA are pending. Oncology on board.  Dysphagia possible thrush: Started on nystatin for 10 days.  Possible aspiration: Patient completed course of IV vancomycin since then. Speech reevaluated the patient recommended a  d regular diet.  COVID-19 pneumonia/multifocal pneumonia: Records of remdesivir and steroids. Resolved.  Poor oral intake: Core track was placed temporarily as he had poor oral intake as it improved culture was discontinued and he is tolerating a regular diet.  Right critical lower limb ischemia: Vascular surgery was reconsulted and he is status post surgical intervention on 05/12/2021 with an above-the-knee amputation. Further management per vascular surgery.  Acute blood loss anemia/retroperitoneal hematoma: CT scan of the abdomen pelvis on 04/18/2021 showed significantly large retroperitoneal hematoma Coumadin was stopped he was given vitamin K and FFP for reversal of his INR. CT scan on 04/24/2021 that showed unchanged.  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 Patient is status post post percutaneous nephrostomy tube placement on 04/20/2021 Peak at 5.3 nephrology was consulted transiently started on hemodialysis. Creatinine has returned to baseline with good urine output, HD catheter has been discontinued.  Left hydronephrosis: Left percutaneous nephrostomy tube placed on 04/20/2021, they recommended to continue for at least 2 months and follow-up with urology as an outpatient.  Goals of care: Will need to consult hospice and Perative care to started discussing goals of care.  Chronic systolic heart failure: Appears to be euvolemic.  Permanent atrial fibrillation with a history of a bioprosthetic mitral valve: Currently on IV argatroban and amiodarone stable.  Diabetes mellitus type 2: With an A1c of 5.4.  Essential hypertension: Continue to hold antihypertensive medication, seems stable.   Sacral decubitus pressure injury of skin stage II:  RN Pressure Injury Documentation: Pressure Injury 04/21/21 Coccyx Medial Stage 2 -  Partial  thickness loss of dermis presenting as a shallow open injury with a red, pink wound bed without slough. (Active)  04/21/21 1255  Location: Coccyx  Location Orientation: Medial  Staging: Stage 2 -  Partial thickness loss of dermis presenting as a shallow open injury with a red, pink wound bed without slough.  Wound Description (Comments):   Present on Admission: No     DVT prophylaxis: IV heparin Family Communication:none Status is: Inpatient  Remains inpatient appropriate because:Hemodynamically unstable  Dispo: The patient is from: Home              Anticipated d/c is to: SNF              Patient currently is not medically stable to d/c.   Difficult to place patient No        Code Status:     Code Status Orders  (From admission, onward)           Start     Ordered   04/08/21 1211  Full code  Continuous        04/08/21 1212           Code Status History     Date Active Date Inactive Code Status Order ID Comments User Context   03/06/2021 1816 03/13/2021 0325 Full Code [id number]  Ortencia Kick Inpatient   02/25/2021 1202 02/25/2021 2000 Full Code [id number]  Cherre Robins, MD Inpatient   01/30/2021 1513 01/30/2021 2320 Full Code [id number]  Larey Dresser, MD Inpatient   08/26/2020 1818 09/06/2020 2203 Full Code [id number]  Ledora Bottcher, [place] [facility]   06/11/2016 1627 06/12/2016 1608 Full Code [id number]  Constance Haw, MD Inpatient   08/30/2015 0114 08/30/2015 1540 Full Code [id number]  Edwin Dada, MD Inpatient   09/29/2012 1803 10/06/2012 2104 Full Code [id number]  Cephus Richer, RN Inpatient   09/26/2012 1331 09/29/2012 1803 Full Code [id number]  Lenox Ahr, RN Inpatient      Advance Directive Documentation    Flowsheet Row Most Recent Value  Type of Advance Directive Living will  Pre-existing out of facility DNR order (yellow form or pink MOST form) --  "MOST" Form in Place? --         IV Access:   Peripheral  IV   Procedures and diagnostic studies:   No results found.   Medical Consultants:   None.   Subjective:    Ryan [name] l feels better no complaints  Objective:    Vitals:   05/15/21 2335 05/16/21 0414 05/16/21 0454 05/16/21 0745  BP: 128/66 135/68  134/66  Pulse: 82 66  71  Resp: '18 18  18  '$ Temp: 98.2 F (36.8 C) 98.4 F (36.9 C)  98 F (36.7 C)  TempSrc: Oral Oral  Oral  SpO2: 93% 92%  95%  Weight:   78.9 kg   Height:       SpO2: 95 % O2 Flow Rate (L/min): 3 L/min FiO2 (%): (S) 60 %   Intake/Output Summary (Last 24 hours) at 05/16/2021 0840 Last data filed at 05/16/2021 0414 Gross per 24 hour  Intake 524.07 ml  Output 950 ml  Net -425.93 ml    Filed Weights   05/13/21 0459 05/15/21 0409 05/16/21 0454  Weight: 79.8 kg 79.8 kg 78.9 kg    Exam: General exam: In no acute distress. Respiratory system: Good air movement and clear to auscultation. Cardiovascular system: S1 & S2 heard, RRR. No JVD. Gastrointestinal system: Abdomen is nondistended, soft and nontender.  Extremities: Above-the-knee amputation stump healing well Skin: No rashes, lesions or ulcers  Data Reviewed:    Labs: Basic Metabolic Panel: Recent Labs  Lab 05/10/21 0407 05/13/21 0444  NA 141 141  K 4.6 4.6  CL 111 112*  CO2 24 23  GLUCOSE 166* 98  BUN 45* 31*  CREATININE 1.13 1.23  CALCIUM 8.7* 8.2*    GFR Estimated Creatinine Clearance: 55.2 mL/min (by C-G formula based on SCr of 1.23 mg/dL). Liver Function Tests: No results for input(s): AST, ALT, ALKPHOS, BILITOT, PROT, ALBUMIN in the last 168 hours. No results for input(s): LIPASE, AMYLASE in the last 168 hours. No results for input(s): AMMONIA in the last 168 hours. Coagulation profile No results for input(s): INR, PROTIME in the last 168 hours. COVID-19 Labs  No results for input(s): DDIMER, FERRITIN, LDH, CRP in the last 72 hours.  Lab Results  Component Value Date   SARSCOV2NAA POSITIVE (A) 04/28/2021    [place] NEGATIVE 04/08/2021   [place] NEGATIVE 03/03/2021   [place] NEGATIVE 01/28/2021    CBC: Recent Labs  Lab 05/12/21 0430 05/13/21 0444 05/14/21 0630 05/15/21 0255 05/16/21 0430  WBC 10.2 13.6* 10.5 10.6* 9.9  HGB 9.7* 8.6* 7.9* 8.0* 7.7*  HCT 31.8* 28.0* 26.0* 26.3* 24.8*  MCV 81.3 82.4 82.8 81.9 80.5  PLT 141* 110* 83* 81* 92*    Cardiac Enzymes: No results for input(s): CKTOTAL, CKMB, CKMBINDEX, TROPONINI in the last 168 hours. BNP (last 3 results) Recent Labs    08/25/20 1121 03/27/21 1123  PROBNP 21,395* 3,146*    CBG: Recent Labs  Lab 05/15/21 1724 05/15/21 1830 05/15/21 1954 05/15/21 2331 05/16/21 0413  GLUCAP 68* 94 113* 112* 76    D-Dimer: No results for input(s): DDIMER in the last 72 hours. Hgb A1c: No results for input(s): HGBA1C in the last 72 hours. Lipid Profile: No results for input(s): CHOL, HDL, LDLCALC, TRIG, CHOLHDL, LDLDIRECT in the last 72 hours. Thyroid function studies: No results for input(s): TSH, T4TOTAL, T3FREE, THYROIDAB in the last 72 hours.  Invalid input(s): FREET3 Anemia work up: No results for input(s): VITAMINB12, FOLATE, FERRITIN, TIBC, IRON, RETICCTPCT in the last 72 hours. Sepsis Labs: Recent Labs  Lab 05/13/21 0444 05/14/21 0630 05/15/21 0255 05/16/21 0430  WBC 13.6* 10.5 10.6* 9.9    Microbiology No results found for this or any previous visit (from the past 240 hour(s)).   Medications:    amiodarone  200 mg Oral Daily   vitamin C  500 mg Oral Daily   atorvastatin  40 mg Oral q1800   benzonatate  100 mg Oral TID   Chlorhexidine Gluconate Cloth  6 each Topical Daily   dextromethorphan-guaiFENesin  1 tablet Oral BID   docusate sodium  100 mg Oral BID   feeding supplement  237 mL Oral TID BM   insulin aspart  0-15 Units Subcutaneous Q4H   midodrine  10 mg Oral TID WC   multivitamin with minerals  1 tablet Oral Daily   mupirocin ointment  1 application Nasal BID   pantoprazole  40  mg Oral Daily   sodium chloride flush  10-40 mL Intracatheter Q12H   zinc sulfate  220 mg Oral Daily   Continuous Infusions:  argatroban 0.5 mcg/kg/min (05/15/21 2349)      LOS: 38 days   Charlynne Cousins  [facility]  05/16/2021, 8:40 AM

## 2021-05-16 NOTE — Progress Notes (Signed)
Hematology Labs reviewed. Patient not seen HIT Ab: Neg Platelets: 92 Continue with Argatroban in spite of Neg HIT Ab (awaiting SRA) Persistent Normocytic anemia of Acute Illness: Transfuse if symptomatic Will follow

## 2021-05-16 NOTE — Progress Notes (Addendum)
ANTICOAGULATION CONSULT NOTE - Follow Up Consult  Pharmacy Consult for Argatroban Indication: pulmonary embolus, DVT, and possible HIT  Allergies  Allergen Reactions   Tuna [Fish Allergy] Nausea And Vomiting    Patient Measurements: Height: 6' (182.9 cm) Weight: 78.9 kg (174 lb) IBW/kg (Calculated) : 77.6 Argatroban Dosing Weight: 80 kg  Vital Signs: Temp: 98.4 F (36.9 C) (08/27 0414) Temp Source: Oral (08/27 0414) BP: 135/68 (08/27 0414) Pulse Rate: 66 (08/27 0414)  Labs: Recent Labs    05/14/21 0630 05/14/21 1335 05/14/21 2244 05/15/21 0255 05/16/21 0430  HGB 7.9*  --   --  8.0* 7.7*  HCT 26.0*  --   --  26.3* 24.8*  PLT 83*  --   --  81* 92*  APTT  --    < > 64* 62* 60*  HEPARINUNFRC 0.54  --   --   --   --    < > = values in this interval not displayed.     Estimated Creatinine Clearance: 55.2 mL/min (by C-G formula based on SCr of 1.23 mg/dL).  Assessment: 75 yr old man admitted on 04/08/21 with critical limb ischemic of RLE.  Pt with permanent atrial fibrillation, also with mitral valve replacement with bioprosthetic valve (on warfarin PTA for afib, on hold). Also recent dx of COVID infection. DVT prophylaxis had been on hold due to recent retroperitoneal hematoma, for which he was given vit K and FFP (repeat CT on 8/5 remained unchanged). Now with acute PE per VQ scan 8/16 and bilateral DVTs per duplex 8/15. Pharmacy was initially consulted for IV heparin without boluses on 8/15.   Platelet count dropped from 212K to 83K on 8/25 and IV heparin changed to Argatroban on 8/25. HIT antibody was negative and SRA is in process. Although no HIT antibody is present, will plan to continue with current treatment until SRA results. Peripheral smear review noted normocytic anemia with with anisopoikilocytosis including schistocytes.     aPTT is therapeutic (60 seconds) on Argatroban at 0.5 mcg/kg/min. Goal heparin levels at lower end of therapeutic range (0.3-0.5) with  recent hematoma, and will try to keep aPTTs at lower end of therapeutic range . Platelet count 92K. Hemoglobin low stable. No bleeding reported.   Goal of Therapy:  aPTT 50-90 seconds (try for 50-70 sec with recent hematoma) Monitor platelets by anticoagulation protocol: Yes   Plan:  Continue Argatroban at 0.5 mcg/kg/min (dosing weight 80 kg) until SRA results Daily aPTT and CBC. Follow up SRA  Monitor for signs/symptoms of bleeding.  Thank you for allowing pharmacy to participate in this patient's care.  Reatha Harps, PharmD PGY1 Pharmacy Resident 05/16/2021 7:40 AM Check [email or web address] for unit specific pharmacy number

## 2021-05-17 LAB — CBC
HCT: 25.7 % — ABNORMAL LOW (ref 39.0–52.0)
Hemoglobin: 7.6 g/dL — ABNORMAL LOW (ref 13.0–17.0)
MCH: 24.3 pg — ABNORMAL LOW (ref 26.0–34.0)
MCHC: 29.6 g/dL — ABNORMAL LOW (ref 30.0–36.0)
MCV: 82.1 fL (ref 80.0–100.0)
Platelets: 88 10*3/uL — ABNORMAL LOW (ref 150–400)
RBC: 3.13 MIL/uL — ABNORMAL LOW (ref 4.22–5.81)
RDW: 24.3 % — ABNORMAL HIGH (ref 11.5–15.5)
WBC: 7.4 10*3/uL (ref 4.0–10.5)
nRBC: 0 % (ref 0.0–0.2)

## 2021-05-17 LAB — HEPATIC FUNCTION PANEL
ALT: 28 U/L (ref 0–44)
AST: 36 U/L (ref 15–41)
Albumin: 2.1 g/dL — ABNORMAL LOW (ref 3.5–5.0)
Alkaline Phosphatase: 77 U/L (ref 38–126)
Bilirubin, Direct: 0.3 mg/dL — ABNORMAL HIGH (ref 0.0–0.2)
Indirect Bilirubin: 0.9 mg/dL (ref 0.3–0.9)
Total Bilirubin: 1.2 mg/dL (ref 0.3–1.2)
Total Protein: 5.7 g/dL — ABNORMAL LOW (ref 6.5–8.1)

## 2021-05-17 LAB — GLUCOSE, CAPILLARY
Glucose-Capillary: 104 mg/dL — ABNORMAL HIGH (ref 70–99)
Glucose-Capillary: 104 mg/dL — ABNORMAL HIGH (ref 70–99)
Glucose-Capillary: 76 mg/dL (ref 70–99)
Glucose-Capillary: 78 mg/dL (ref 70–99)
Glucose-Capillary: 80 mg/dL (ref 70–99)
Glucose-Capillary: 84 mg/dL (ref 70–99)

## 2021-05-17 LAB — APTT
aPTT: 35 seconds (ref 24–36)
aPTT: 67 seconds — ABNORMAL HIGH (ref 24–36)

## 2021-05-17 NOTE — Progress Notes (Signed)
ANTICOAGULATION CONSULT NOTE - Follow Up Consult  Pharmacy Consult for Argatroban Indication: pulmonary embolus, DVT, and possible HIT  Allergies  Allergen Reactions   Tuna [Fish Allergy] Nausea And Vomiting    Patient Measurements: Height: 6' (182.9 cm) Weight: 77.6 kg (171 lb) IBW/kg (Calculated) : 77.6 Argatroban Dosing Weight: 80 kg  Vital Signs: Temp: 98.2 F (36.8 C) (08/28 1124) Temp Source: Oral (08/28 1124) BP: 123/73 (08/28 1124) Pulse Rate: 70 (08/28 1124)  Labs: Recent Labs    05/15/21 0255 05/16/21 0430 05/17/21 0600 05/17/21 1611  HGB 8.0* 7.7* 7.6*  --   HCT 26.3* 24.8* 25.7*  --   PLT 81* 92* 88*  --   APTT 62* 60* 35 67*     Estimated Creatinine Clearance: 55.2 mL/min (by C-G formula based on SCr of 1.23 mg/dL).  Assessment: 75 yr old man admitted on 04/08/21 with critical limb ischemic of RLE.  Pt with permanent atrial fibrillation, also with mitral valve replacement with bioprosthetic valve (on warfarin PTA for afib, on hold). DVT prophylaxis had been on hold due to recent retroperitoneal hematoma, for which he was given vit K and FFP (repeat CT on 8/5 remained unchanged). Now with acute PE per VQ scan 8/16 and bilateral DVTs per duplex 8/15. Pharmacy was initially consulted for IV heparin without boluses on 8/15. Platelet count dropped from 212K to 83K on 8/25 and IV heparin changed to Argatroban on 8/25. HIT antibody was negative and SRA is in process. Heme/onc would like to continue with argatroban until SRA results.    Repeat aPTT is therapeutic at 67 seconds, SRA remains in process.  Goal of Therapy:  aPTT 50-90 seconds (try for 50-70 sec with recent hematoma) Monitor platelets by anticoagulation protocol: Yes   Plan:  Continue argatroban 0.6 mcg/kg/min Daily aPTT F/U SRA results  Arrie Senate, PharmD, BCPS, [facility] Clinical Pharmacist [phone] Please check AMION for all [facility] numbers 05/17/2021

## 2021-05-17 NOTE — Progress Notes (Signed)
[facility] PROGRESS NOTE    Progress Note  Ryan [name]  [id number] DOB: 08-08-1946 DOA: 04/08/2021 PCP: [name], MD     Brief Narrative:   Ryan [name] is an 75 y.o. 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  In this admission the patient developed significant AKI left hydronephrosis and ATN from hypotension requiring he will dialysis transiently which has now been discontinued and retroperitoneal bleed  Assessment/Plan:   Acute respiratory failure with hypoxia: Multifactorial likely due to PE and bacterial pneumonia versus COVID-19 infection Required high flow nasal cannula, we have been able to wean him to room air.    Acute pulmonary embolism/acute lower extremity DVT: In setting of COVID infection, was initially on Coumadin which had to be discontinued due to retroperitoneal hematoma and intramuscular hemorrhage. VQ scan done on 05/08/2019 lower extremity DVT after discussing with vascular surgery he was restarted back on IV heparin.  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 Hemoglobin stable continue to monitor.  Acute thrombocytopenia: Platelet has stabilized around 90-88.  Recheck a CBC tomorrow morning Continue argatroban serotonin release yet assay and HIT panel are pending. Oncology on board and appreciate assistance.  Dysphagia possible thrush: Started on nystatin for 10 days.  Possible aspiration: Patient completed course of IV vancomycin since then. Speech reevaluated the  patient recommended a d regular diet.  COVID-19 pneumonia/multifocal pneumonia: Records of remdesivir and steroids. Resolved.  Poor oral intake: Core track was placed temporarily as he had poor oral intake, he pulled out his core track by accident. He has been eating about 25% of his food continue Ensure 3 times daily.   If no improvement over the next 24 hours will need to place core track.  Right critical lower limb ischemia: Vascular surgery was reconsulted and he is status post surgical intervention on 05/12/2021 with an above-the-knee amputation. Further management per vascular surgery.  Acute blood loss anemia/retroperitoneal hematoma: CT scan of the abdomen pelvis on 04/18/2021 showed significantly large retroperitoneal hematoma Coumadin was stopped he was given vitamin K and FFP for reversal of his INR. CT scan on 04/24/2021 that showed unchanged.  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 Patient is status post post percutaneous nephrostomy tube placement on 04/20/2021 Peak at 5.3 nephrology was consulted transiently started on hemodialysis. Creatinine has returned to baseline with good urine output, HD catheter has been discontinued.  Left hydronephrosis: Left percutaneous nephrostomy tube placed on 04/20/2021, they recommended to continue for at least 2 months and follow-up with urology as an outpatient.  Goals of care: Will need to consult hospice and Perative care to started discussing goals of care.  Chronic systolic heart failure: Appears to be euvolemic.  Permanent atrial fibrillation with a history of a bioprosthetic mitral valve: Currently on IV argatroban and amiodarone stable.  Diabetes mellitus type 2: With an A1c of 5.4.  Essential hypertension: Continue to hold antihypertensive medication, seems stable.   Sacral decubitus  pressure injury of skin stage II:  RN Pressure Injury Documentation: Pressure Injury 04/21/21  Coccyx Medial Stage 2 -  Partial thickness loss of dermis presenting as a shallow open injury with a red, pink wound bed without slough. (Active)  04/21/21 1255  Location: Coccyx  Location Orientation: Medial  Staging: Stage 2 -  Partial thickness loss of dermis presenting as a shallow open injury with a red, pink wound bed without slough.  Wound Description (Comments):   Present on Admission: No     DVT prophylaxis: IV heparin Family Communication:none Status is: Inpatient  Remains inpatient appropriate because:Hemodynamically unstable  Dispo: The patient is from: Home              Anticipated d/c is to: SNF              Patient currently is not medically stable to d/c.   Difficult to place patient No        Code Status:     Code Status Orders  (From admission, onward)           Start     Ordered   04/08/21 1211  Full code  Continuous        04/08/21 1212           Code Status History     Date Active Date Inactive Code Status Order ID Comments User Context   03/06/2021 1816 03/13/2021 0325 Full Code [id number]  Ortencia Kick Inpatient   02/25/2021 1202 02/25/2021 2000 Full Code [id number]  Cherre Robins, MD Inpatient   01/30/2021 1513 01/30/2021 2320 Full Code [id number]  Larey Dresser, MD Inpatient   08/26/2020 1818 09/06/2020 2203 Full Code [id number]  Ledora Bottcher, [place] Inpatient   06/11/2016 1627 06/12/2016 1608 Full Code [id number]  Constance Haw, MD Inpatient   08/30/2015 0114 08/30/2015 1540 Full Code [id number]  Edwin Dada, MD Inpatient   09/29/2012 1803 10/06/2012 2104 Full Code [id number]  Cephus Richer, RN Inpatient   09/26/2012 1331 09/29/2012 1803 Full Code [id number]  Lenox Ahr, RN Inpatient      Advance Directive Documentation    Flowsheet Row Most Recent Value  Type of Advance Directive Living will  Pre-existing  out of facility DNR order (yellow form or pink MOST form) --  "MOST" Form in Place? --         IV Access:   Peripheral IV   Procedures and diagnostic studies:   No results found.   Medical Consultants:   None.   Subjective:    Ryan [name] no complaints  Objective:    Vitals:   05/17/21 0008 05/17/21 0500 05/17/21 0540 05/17/21 0752  BP: 123/62  126/66 132/72  Pulse: 71  67 71  Resp: '17  14 18  '$ Temp: 98.5 F (36.9 C)  98 F (36.7 C) 98.2 F (36.8 C)  TempSrc: Oral  Oral Oral  SpO2: 100%  98% 97%  Weight:  77.6 kg    Height:       SpO2: 97 % O2 Flow Rate (L/min): 3 L/min FiO2 (%): (S) 60 %   Intake/Output Summary (Last 24 hours) at 05/17/2021 0852 Last data filed at 05/17/2021 0550 Gross per 24 hour  Intake --  Output 1550 ml  Net -1550 ml    Filed Weights   05/15/21 0409 05/16/21 0454 05/17/21 0500  Weight: 79.8 kg 78.9 kg 77.6 kg    Exam: General exam: In no acute distress. Respiratory system: Good air movement and clear to auscultation. Cardiovascular system: S1 & S2 heard, RRR.  No JVD. Gastrointestinal system: Abdomen is nondistended, soft and nontender.  Extremities: Stump looks clean Skin: No rashes, lesions or ulcers Psychiatry: Judgement and insight appear normal. Mood & affect appropriate.  Data Reviewed:    Labs: Basic Metabolic Panel: Recent Labs  Lab 05/13/21 0444  NA 141  K 4.6  CL 112*  CO2 23  GLUCOSE 98  BUN 31*  CREATININE 1.23  CALCIUM 8.2*    GFR Estimated Creatinine Clearance: 55.2 mL/min (by C-G formula based on SCr of 1.23 mg/dL). Liver Function Tests: No results for input(s): AST, ALT, ALKPHOS, BILITOT, PROT, ALBUMIN in the last 168 hours. No results for input(s): LIPASE, AMYLASE in the last 168 hours. No results for input(s): AMMONIA in the last 168 hours. Coagulation profile No results for input(s): INR, PROTIME in the last 168 hours. COVID-19 Labs  No results for input(s): DDIMER, FERRITIN,  LDH, CRP in the last 72 hours.  Lab Results  Component Value Date   SARSCOV2NAA POSITIVE (A) 04/28/2021   [place] NEGATIVE 04/08/2021   [place] NEGATIVE 03/03/2021   [place] NEGATIVE 01/28/2021    CBC: Recent Labs  Lab 05/13/21 0444 05/14/21 0630 05/15/21 0255 05/16/21 0430 05/17/21 0600  WBC 13.6* 10.5 10.6* 9.9 7.4  HGB 8.6* 7.9* 8.0* 7.7* 7.6*  HCT 28.0* 26.0* 26.3* 24.8* 25.7*  MCV 82.4 82.8 81.9 80.5 82.1  PLT 110* 83* 81* 92* 88*    Cardiac Enzymes: No results for input(s): CKTOTAL, CKMB, CKMBINDEX, TROPONINI in the last 168 hours. BNP (last 3 results) Recent Labs    08/25/20 1121 03/27/21 1123  PROBNP 21,395* 3,146*    CBG: Recent Labs  Lab 05/16/21 1056 05/16/21 1626 05/16/21 2051 05/17/21 0013 05/17/21 0529  GLUCAP 94 119* 91 80 76    D-Dimer: No results for input(s): DDIMER in the last 72 hours. Hgb A1c: No results for input(s): HGBA1C in the last 72 hours. Lipid Profile: No results for input(s): CHOL, HDL, LDLCALC, TRIG, CHOLHDL, LDLDIRECT in the last 72 hours. Thyroid function studies: No results for input(s): TSH, T4TOTAL, T3FREE, THYROIDAB in the last 72 hours.  Invalid input(s): FREET3 Anemia work up: No results for input(s): VITAMINB12, FOLATE, FERRITIN, TIBC, IRON, RETICCTPCT in the last 72 hours. Sepsis Labs: Recent Labs  Lab 05/14/21 0630 05/15/21 0255 05/16/21 0430 05/17/21 0600  WBC 10.5 10.6* 9.9 7.4    Microbiology No results found for this or any previous visit (from the past 240 hour(s)).   Medications:    amiodarone  200 mg Oral Daily   vitamin C  500 mg Oral Daily   atorvastatin  40 mg Oral q1800   benzonatate  100 mg Oral TID   Chlorhexidine Gluconate Cloth  6 each Topical Daily   dextromethorphan-guaiFENesin  1 tablet Oral BID   docusate sodium  100 mg Oral BID   feeding supplement  237 mL Oral TID BM   insulin aspart  0-15 Units Subcutaneous Q4H   midodrine  10 mg Oral TID WC   multivitamin  with minerals  1 tablet Oral Daily   pantoprazole  40 mg Oral Daily   sodium chloride flush  10-40 mL Intracatheter Q12H   zinc sulfate  220 mg Oral Daily   Continuous Infusions:  argatroban 0.5 mcg/kg/min (05/15/21 2349)      LOS: 39 days   Charlynne Cousins  [facility]  05/17/2021, 8:52 AM

## 2021-05-17 NOTE — Progress Notes (Addendum)
ANTICOAGULATION CONSULT NOTE - Follow Up Consult  Pharmacy Consult for Argatroban Indication: pulmonary embolus, DVT, and possible HIT  Allergies  Allergen Reactions   Tuna [Fish Allergy] Nausea And Vomiting    Patient Measurements: Height: 6' (182.9 cm) Weight: 77.6 kg (171 lb) IBW/kg (Calculated) : 77.6 Argatroban Dosing Weight: 80 kg  Vital Signs: Temp: 98.2 F (36.8 C) (08/28 0752) Temp Source: Oral (08/28 0752) BP: 132/72 (08/28 0752) Pulse Rate: 71 (08/28 0752)  Labs: Recent Labs    05/15/21 0255 05/16/21 0430 05/17/21 0600  HGB 8.0* 7.7* 7.6*  HCT 26.3* 24.8* 25.7*  PLT 81* 92* 88*  APTT 62* 60* 35     Estimated Creatinine Clearance: 55.2 mL/min (by C-G formula based on SCr of 1.23 mg/dL).  Assessment: 75 yr old man admitted on 04/08/21 with critical limb ischemic of RLE.  Pt with permanent atrial fibrillation, also with mitral valve replacement with bioprosthetic valve (on warfarin PTA for afib, on hold). Also recent dx of COVID infection. DVT prophylaxis had been on hold due to recent retroperitoneal hematoma, for which he was given vit K and FFP (repeat CT on 8/5 remained unchanged). Now with acute PE per VQ scan 8/16 and bilateral DVTs per duplex 8/15. Pharmacy was initially consulted for IV heparin without boluses on 8/15.   Platelet count dropped from 212K to 83K on 8/25 and IV heparin changed to Argatroban on 8/25. HIT antibody was negative and SRA is in process. Heme/onc would like to continue with argatroban until SRA results. Peripheral smear review noted normocytic anemia with with anisopoikilocytosis including schistocytes.     aPTT is subtherapeutic (35 seconds) on Argatroban at 0.5 mcg/kg/min. Confirmed with nursing that the drip was not paused any overnight, the line was working, and the sample was not diluted. Personally verified drip rate at 2.4 mL/hr. Will try to keep aPTTs at lower end of therapeutic range with recent hematoma. Plan for a  conservative increase of 20% as to not over-correct, since multiple aPTTs have been therapeutic. Platelet count 88K. Hemoglobin low stable. No bleeding reported.   Goal of Therapy:  aPTT 50-90 seconds (try for 50-70 sec with recent hematoma) Monitor platelets by anticoagulation protocol: Yes   Plan:  Increase Argatroban to 0.6 mcg/kg/min (dosing weight 80 kg) until SRA results Follow up aPTT in 4 hours Daily aPTT and CBC. Follow up SRA  Monitor for signs/symptoms of bleeding.  Thank you for allowing pharmacy to participate in this patient's care.  Reatha Harps, PharmD PGY1 Pharmacy Resident 05/17/2021 9:45 AM Check [email or web address] for unit specific pharmacy number

## 2021-05-18 LAB — CBC
HCT: 27 % — ABNORMAL LOW (ref 39.0–52.0)
Hemoglobin: 8.4 g/dL — ABNORMAL LOW (ref 13.0–17.0)
MCH: 24.8 pg — ABNORMAL LOW (ref 26.0–34.0)
MCHC: 31.1 g/dL (ref 30.0–36.0)
MCV: 79.6 fL — ABNORMAL LOW (ref 80.0–100.0)
Platelets: 92 10*3/uL — ABNORMAL LOW (ref 150–400)
RBC: 3.39 MIL/uL — ABNORMAL LOW (ref 4.22–5.81)
RDW: 23.8 % — ABNORMAL HIGH (ref 11.5–15.5)
WBC: 8.5 10*3/uL (ref 4.0–10.5)
nRBC: 0 % (ref 0.0–0.2)

## 2021-05-18 LAB — GLUCOSE, CAPILLARY
Glucose-Capillary: 107 mg/dL — ABNORMAL HIGH (ref 70–99)
Glucose-Capillary: 86 mg/dL (ref 70–99)
Glucose-Capillary: 87 mg/dL (ref 70–99)
Glucose-Capillary: 88 mg/dL (ref 70–99)
Glucose-Capillary: 89 mg/dL (ref 70–99)
Glucose-Capillary: 94 mg/dL (ref 70–99)

## 2021-05-18 LAB — SEROTONIN RELEASE ASSAY (SRA)
SRA .2 IU/mL UFH Ser-aCnc: <1 % (ref 0–20)
SRA 100IU/mL UFH Ser-aCnc: <1 % (ref 0–20)

## 2021-05-18 LAB — APTT: aPTT: 64 seconds — ABNORMAL HIGH (ref 24–36)

## 2021-05-18 MED ORDER — APIXABAN 5 MG PO TABS
5.0000 mg | ORAL_TABLET | Freq: Two times a day (BID) | ORAL | Status: DC
  Administered 2021-05-18 – 2021-05-22 (×9): 5 mg via ORAL
  Filled 2021-05-18 (×9): qty 1

## 2021-05-18 NOTE — Progress Notes (Signed)
No ICM remote transmission received for 05/18/2021 due to current hospitalization and next ICM transmission scheduled for 06/22/2021.

## 2021-05-18 NOTE — Progress Notes (Signed)
Contacted infection prevention regarding patients need for isolation precautions at this time. Infection prevention staff stated he was outside of the window for needing isolation precautions. Bed side RN made aware. Soha Thorup, Bettina Gavia RN

## 2021-05-18 NOTE — Progress Notes (Signed)
ANTICOAGULATION CONSULT NOTE - Follow Up Consult  Pharmacy Consult for apixaban Indication: pulmonary embolus and DVT  Allergies  Allergen Reactions   Tuna [Fish Allergy] Nausea And Vomiting    Patient Measurements: Height: 6' (182.9 cm) Weight: 78.9 kg (173 lb 15.7 oz) IBW/kg (Calculated) : 77.6 Argatroban Dosing Weight: 80 kg  Vital Signs: Temp: 97.8 F (36.6 C) (08/29 1600) Temp Source: Oral (08/29 1600) BP: 104/87 (08/29 1600) Pulse Rate: 87 (08/29 1600)  Labs: Recent Labs    05/16/21 0430 05/17/21 0600 05/17/21 1611 05/18/21 0233  HGB 7.7* 7.6*  --  8.4*  HCT 24.8* 25.7*  --  27.0*  PLT 92* 88*  --  92*  APTT 60* 35 67* 64*     Estimated Creatinine Clearance: 55.2 mL/min (by C-G formula based on SCr of 1.23 mg/dL).  Assessment: 75 yr old man admitted on 04/08/21 with critical limb ischemic of RLE.  Pt with permanent atrial fibrillation, also with mitral valve replacement with bioprosthetic valve (on warfarin PTA for afib, on hold). DVT prophylaxis had been on hold due to recent retroperitoneal hematoma, for which he was given vit K and FFP (repeat CT on 8/5 remained unchanged). Now with acute PE per VQ scan 8/16 and bilateral DVTs per duplex 8/15. Pharmacy was initially consulted for IV heparin without boluses on 8/15. Platelet count dropped from 212K to 83K on 8/25 and IV heparin changed to Argatroban on 8/25. HIT antibody was negative and SRA is in process. Heme/onc would like to continue with argatroban until SRA results.    05/18/21: Today's aPTT is therapeutic at 64 seconds on Argatroban IV infusion 0.6 mcg/kg/min, SRA remains in process.  H/H low/ stable, Hgb up to 8.4 today and PLTC low/stable. Had RP hematoma 04/18/21.  05/18/21 update: SRA negative. Discussed result with Dr Venetia Constable and given order to start apixaban. Patient has been on IV anticoagulation >7d and dose not need the loading dose.  Plan:  Discontinue argatroban Begin apixaban 5 mg PO bid at the  time argatroban is turned off Educate prior to discharge Pharmacy signing off but will continue to follow peripherally - please re-consult if needed  Thank you for involving pharmacy in this patient's care.  Renold Genta, PharmD, BCPS Clinical Pharmacist Clinical phone for 05/18/2021 until 10p is [phone] 05/18/2021 5:06 PM  **Pharmacist phone directory can be found on [email or web address] listed under [place]**

## 2021-05-18 NOTE — Progress Notes (Signed)
ANTICOAGULATION CONSULT NOTE - Follow Up Consult  Pharmacy Consult for Argatroban Indication: pulmonary embolus, DVT, and possible HIT  Allergies  Allergen Reactions   Tuna [Fish Allergy] Nausea And Vomiting    Patient Measurements: Height: 6' (182.9 cm) Weight: 78.9 kg (173 lb 15.7 oz) IBW/kg (Calculated) : 77.6 Argatroban Dosing Weight: 80 kg  Vital Signs: Temp: 97.8 F (36.6 C) (08/29 1255) Temp Source: Oral (08/29 1255) BP: 131/69 (08/29 1255) Pulse Rate: 62 (08/29 1255)  Labs: Recent Labs    05/16/21 0430 05/17/21 0600 05/17/21 1611 05/18/21 0233  HGB 7.7* 7.6*  --  8.4*  HCT 24.8* 25.7*  --  27.0*  PLT 92* 88*  --  92*  APTT 60* 35 67* 64*     Estimated Creatinine Clearance: 55.2 mL/min (by C-G formula based on SCr of 1.23 mg/dL).  Assessment: 75 yr old man admitted on 04/08/21 with critical limb ischemic of RLE.  Pt with permanent atrial fibrillation, also with mitral valve replacement with bioprosthetic valve (on warfarin PTA for afib, on hold). DVT prophylaxis had been on hold due to recent retroperitoneal hematoma, for which he was given vit K and FFP (repeat CT on 8/5 remained unchanged). Now with acute PE per VQ scan 8/16 and bilateral DVTs per duplex 8/15. Pharmacy was initially consulted for IV heparin without boluses on 8/15. Platelet count dropped from 212K to 83K on 8/25 and IV heparin changed to Argatroban on 8/25. HIT antibody was negative and SRA is in process. Heme/onc would like to continue with argatroban until SRA results.    05/18/21: Today's aPTT is therapeutic at 64 seconds on Argatroban  IV infusion 0.6 mcg/kg/min, SRA remains in process.  H/H low/ stable, Hgb up to 8.4 today and PLTC low/stable. Had RP hematoma 04/18/21.  Goal of Therapy:  aPTT 50-90 seconds (try for 50-70 sec with recent hematoma) Monitor platelets by anticoagulation protocol: Yes   Plan:  Continue argatroban 0.6 mcg/kg/min Daily aPTT F/U SRA results  Nicole Cella,  RPh Clinical Pharmacist [phone] Please check AMION for all [place] numbers 05/18/2021

## 2021-05-18 NOTE — Care Management Important Message (Signed)
Important Message  Patient Details  Name: Ryan [name] MRN: [id number] Date of Birth: August 31, 1946   Medicare Important Message Given:  Yes     Shelda Altes 05/18/2021, 11:25 AM

## 2021-05-18 NOTE — Progress Notes (Signed)
[facility] PROGRESS NOTE    Progress Note  LAVANCE BRATZ  [id number] DOB: 09-Apr-1946 DOA: 04/08/2021 PCP: [name], MD     Brief Narrative:   Ryan [name] is an 75 y.o. 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  In this admission the patient developed significant AKI left hydronephrosis and ATN from hypotension requiring he will dialysis transiently which has now been discontinued and retroperitoneal bleed  Assessment/Plan:   Acute respiratory failure with hypoxia: Multifactorial likely due to PE and bacterial pneumonia versus COVID-19 infection Required high flow nasal cannula, we have been able to wean him to room air.    Acute pulmonary embolism/acute lower extremity DVT: In setting of COVID infection, was initially on Coumadin which had to be discontinued due to retroperitoneal hematoma and intramuscular hemorrhage. VQ scan done on 05/08/2019 lower extremity DVT after discussing with vascular surgery he was restarted back on IV heparin.  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 Hemoglobin stable continue to monitor.  Acute thrombocytopenia: Platelets have been hovering in the high 80s to low 90s. Continue argatroban awaiting serotonin release assay, HIT panel is pending. Oncology on board and appreciate assistance.  Dysphagia possible thrush: Completed 10-day course of nystatin. He is tolerating his diet.  Possible aspiration: Patient completed course of IV vancomycin since  then. Speech reevaluated the patient recommended a d regular diet.  COVID-19 pneumonia/multifocal pneumonia: Records of remdesivir and steroids. Resolved.  Poor oral intake: Core track was placed temporarily as he had poor oral intake, he pulled out his core track by accident. He is eating continues to improve his albumin is slowly trending up.  Right critical lower limb ischemia: Vascular surgery was reconsulted and he is status post surgical intervention on 05/12/2021 with an above-the-knee amputation. Further management per vascular surgery.  Acute blood loss anemia/retroperitoneal hematoma: CT scan of the abdomen pelvis on 04/18/2021 showed significantly large retroperitoneal hematoma Coumadin was stopped he was given vitamin K and FFP for reversal of his INR. CT scan on 04/24/2021 that showed unchanged.  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 Patient is status post post percutaneous nephrostomy tube placement on 04/20/2021 Peak at 5.3 nephrology was consulted transiently started on hemodialysis. Creatinine has returned to baseline with good urine output, HD catheter has been discontinued.  Left hydronephrosis: Left percutaneous nephrostomy tube placed on 04/20/2021, they recommended to continue for at least 2 months and follow-up with urology as an outpatient.  Goals of care: Will need to consult hospice and Perative care to started discussing goals of care.  Chronic systolic heart failure: Appears to be euvolemic.  Permanent atrial fibrillation with a history of a bioprosthetic mitral valve: Currently on IV argatroban and amiodarone stable.  Diabetes mellitus type 2: With an A1c of 5.4.  Essential hypertension: Continue to hold antihypertensive medication, seems stable.   Sacral decubitus pressure injury of skin stage II:  RN Pressure  Injury Documentation: Pressure Injury 04/21/21 Coccyx Medial Stage 2 -  Partial thickness loss of dermis presenting as a shallow  open injury with a red, pink wound bed without slough. (Active)  04/21/21 1255  Location: Coccyx  Location Orientation: Medial  Staging: Stage 2 -  Partial thickness loss of dermis presenting as a shallow open injury with a red, pink wound bed without slough.  Wound Description (Comments):   Present on Admission: No     DVT prophylaxis: IV heparin Family Communication:none Status is: Inpatient  Remains inpatient appropriate because:Hemodynamically unstable  Dispo: The patient is from: Home              Anticipated d/c is to: SNF              Patient currently is not medically stable to d/c.   Difficult to place patient No        Code Status:     Code Status Orders  (From admission, onward)           Start     Ordered   04/08/21 1211  Full code  Continuous        04/08/21 1212           Code Status History     Date Active Date Inactive Code Status Order ID Comments User Context   03/06/2021 1816 03/13/2021 0325 Full Code [id number]  Ortencia Kick Inpatient   02/25/2021 1202 02/25/2021 2000 Full Code [id number]  Cherre Robins, MD Inpatient   01/30/2021 1513 01/30/2021 2320 Full Code [id number]  Larey Dresser, MD Inpatient   08/26/2020 1818 09/06/2020 2203 Full Code [id number]  Ledora Bottcher, [place] Inpatient   06/11/2016 1627 06/12/2016 1608 Full Code [id number]  Constance Haw, MD Inpatient   08/30/2015 0114 08/30/2015 1540 Full Code [id number]  Edwin Dada, MD Inpatient   09/29/2012 1803 10/06/2012 2104 Full Code [id number]  Cephus Richer, RN Inpatient   09/26/2012 1331 09/29/2012 1803 Full Code [id number]  Lenox Ahr, RN Inpatient      Advance Directive Documentation    Flowsheet Row Most Recent Value  Type of Advance Directive Living will  Pre-existing out of facility DNR order (yellow form or pink  MOST form) --  "MOST" Form in Place? --         IV Access:   Peripheral IV   Procedures and diagnostic studies:   No results found.   Medical Consultants:   None.   Subjective:    KHASE KAPPEL was sleepy this morning hard to wake up,, but after waking up he had no new complaints.  Objective:    Vitals:   05/17/21 1946 05/18/21 0053 05/18/21 0332 05/18/21 0420  BP: (!) 146/72 (!) 143/64  127/64  Pulse: 70 68  70  Resp: '20 18  20  '$ Temp: 98.9 F (37.2 C) 98.5 F (36.9 C)  98.7 F (37.1 C)  TempSrc: Oral Oral  Oral  SpO2: 97% 93%  98%  Weight:   78.9 kg   Height:       SpO2: 98 % O2 Flow Rate (L/min): 3 L/min FiO2 (%): (S) 60 %   Intake/Output Summary (Last 24 hours) at 05/18/2021 0855 Last data filed at 05/18/2021 0425 Gross per 24 hour  Intake --  Output 1100 ml  Net -1100 ml    Filed Weights   05/16/21 0454 05/17/21 0500 05/18/21 0332  Weight: 78.9 kg 77.6 kg 78.9 kg    Exam: General exam: In no acute distress. Respiratory system: Good air movement and clear to auscultation. Cardiovascular system: S1 & S2  heard, RRR. No JVD. Gastrointestinal system: Abdomen is nondistended, soft and nontender.  Extremities: No pedal edema. Skin: No rashes, lesions or ulcers  Data Reviewed:    Labs: Basic Metabolic Panel: Recent Labs  Lab 05/13/21 0444  NA 141  K 4.6  CL 112*  CO2 23  GLUCOSE 98  BUN 31*  CREATININE 1.23  CALCIUM 8.2*    GFR Estimated Creatinine Clearance: 55.2 mL/min (by C-G formula based on SCr of 1.23 mg/dL). Liver Function Tests: Recent Labs  Lab 05/17/21 0600  AST 36  ALT 28  ALKPHOS 77  BILITOT 1.2  PROT 5.7*  ALBUMIN 2.1*   No results for input(s): LIPASE, AMYLASE in the last 168 hours. No results for input(s): AMMONIA in the last 168 hours. Coagulation profile No results for input(s): INR, PROTIME in the last 168 hours. COVID-19 Labs  No results for input(s): DDIMER, FERRITIN, LDH, CRP in the last 72  hours.  Lab Results  Component Value Date   SARSCOV2NAA POSITIVE (A) 04/28/2021   [place] NEGATIVE 04/08/2021   [place] NEGATIVE 03/03/2021   [place] NEGATIVE 01/28/2021    CBC: Recent Labs  Lab 05/14/21 0630 05/15/21 0255 05/16/21 0430 05/17/21 0600 05/18/21 0233  WBC 10.5 10.6* 9.9 7.4 8.5  HGB 7.9* 8.0* 7.7* 7.6* 8.4*  HCT 26.0* 26.3* 24.8* 25.7* 27.0*  MCV 82.8 81.9 80.5 82.1 79.6*  PLT 83* 81* 92* 88* 92*    Cardiac Enzymes: No results for input(s): CKTOTAL, CKMB, CKMBINDEX, TROPONINI in the last 168 hours. BNP (last 3 results) Recent Labs    08/25/20 1121 03/27/21 1123  PROBNP 21,395* 3,146*    CBG: Recent Labs  Lab 05/17/21 1053 05/17/21 1610 05/17/21 1948 05/18/21 0031 05/18/21 0419  GLUCAP 78 104* 104* 89 87    D-Dimer: No results for input(s): DDIMER in the last 72 hours. Hgb A1c: No results for input(s): HGBA1C in the last 72 hours. Lipid Profile: No results for input(s): CHOL, HDL, LDLCALC, TRIG, CHOLHDL, LDLDIRECT in the last 72 hours. Thyroid function studies: No results for input(s): TSH, T4TOTAL, T3FREE, THYROIDAB in the last 72 hours.  Invalid input(s): FREET3 Anemia work up: No results for input(s): VITAMINB12, FOLATE, FERRITIN, TIBC, IRON, RETICCTPCT in the last 72 hours. Sepsis Labs: Recent Labs  Lab 05/15/21 0255 05/16/21 0430 05/17/21 0600 05/18/21 0233  WBC 10.6* 9.9 7.4 8.5    Microbiology No results found for this or any previous visit (from the past 240 hour(s)).   Medications:    amiodarone  200 mg Oral Daily   vitamin C  500 mg Oral Daily   atorvastatin  40 mg Oral q1800   benzonatate  100 mg Oral TID   Chlorhexidine Gluconate Cloth  6 each Topical Daily   dextromethorphan-guaiFENesin  1 tablet Oral BID   docusate sodium  100 mg Oral BID   feeding supplement  237 mL Oral TID BM   insulin aspart  0-15 Units Subcutaneous Q4H   midodrine  10 mg Oral TID WC   multivitamin with minerals  1 tablet  Oral Daily   pantoprazole  40 mg Oral Daily   sodium chloride flush  10-40 mL Intracatheter Q12H   zinc sulfate  220 mg Oral Daily   Continuous Infusions:  argatroban 0.6 mcg/kg/min (05/17/21 1734)      LOS: 40 days   Charlynne Cousins  [facility]  05/18/2021, 8:55 AM

## 2021-05-18 NOTE — Progress Notes (Addendum)
  Progress Note    05/18/2021 8:01 AM 6 Days Post-Op  Subjective:  sleepy, no complaints   Vitals:   05/18/21 0053 05/18/21 0420  BP: (!) 143/64 127/64  Pulse: 68 70  Resp: 18 20  Temp: 98.5 F (36.9 C) 98.7 F (37.1 C)  SpO2: 93% 98%   Physical Exam: Cardiac:  regular Lungs:  non labored Incisions:  Right AKA staples intact and healing very nicely, no drainage or fluid collections Extremities:  2+ femoral pulses bilaterally. LLE well perfused and warm  CBC    Component Value Date/Time   WBC 8.5 05/18/2021 0233   RBC 3.39 (L) 05/18/2021 0233   HGB 8.4 (L) 05/18/2021 0233   HGB 9.6 (L) 03/27/2021 1123   HCT 27.0 (L) 05/18/2021 0233   HCT 30.4 (L) 03/27/2021 1123   PLT 92 (L) 05/18/2021 0233   PLT 362 03/27/2021 1123   MCV 79.6 (L) 05/18/2021 0233   MCV 77 (L) 03/27/2021 1123   MCH 24.8 (L) 05/18/2021 0233   MCHC 31.1 05/18/2021 0233   RDW 23.8 (H) 05/18/2021 0233   RDW 17.2 (H) 03/27/2021 1123   LYMPHSABS 1,363 06/01/2016 1414   MONOABS 517 06/01/2016 1414   EOSABS 141 06/01/2016 1414   BASOSABS 47 06/01/2016 1414    BMET    Component Value Date/Time   NA 141 05/13/2021 0444   NA 142 03/27/2021 1123   K 4.6 05/13/2021 0444   CL 112 (H) 05/13/2021 0444   CO2 23 05/13/2021 0444   GLUCOSE 98 05/13/2021 0444   BUN 31 (H) 05/13/2021 0444   BUN 17 03/27/2021 1123   CREATININE 1.23 05/13/2021 0444   CREATININE 1.17 06/01/2016 1414   CALCIUM 8.2 (L) 05/13/2021 0444   GFRNONAA >60 05/13/2021 0444   GFRAA 39 (L) 10/01/2020 1157    INR    Component Value Date/Time   INR 1.4 (H) 05/01/2021 0715     Intake/Output Summary (Last 24 hours) at 05/18/2021 0801 Last data filed at 05/18/2021 0425 Gross per 24 hour  Intake --  Output 1100 ml  Net -1100 ml     Assessment/Plan:  75 y.o. male is s/p right AKA 6 Days Post-Op   Right AKA healing very nicely Medical management per primary team Follow up has been arranged for staple removal in 4  weeks  Karoline Caldwell, [place] [facility] [phone] 05/18/2021 8:01 AM  VASCULAR STAFF ADDENDUM: I have independently interviewed and examined the patient. I agree with the above.  Please call for questions.  [name]. [name], MD [facility] Phone Number: [phone] 05/18/2021 11:52 AM

## 2021-05-19 LAB — CBC
HCT: 27.4 % — ABNORMAL LOW (ref 39.0–52.0)
Hemoglobin: 8.5 g/dL — ABNORMAL LOW (ref 13.0–17.0)
MCH: 24.6 pg — ABNORMAL LOW (ref 26.0–34.0)
MCHC: 31 g/dL (ref 30.0–36.0)
MCV: 79.4 fL — ABNORMAL LOW (ref 80.0–100.0)
Platelets: 86 10*3/uL — ABNORMAL LOW (ref 150–400)
RBC: 3.45 MIL/uL — ABNORMAL LOW (ref 4.22–5.81)
RDW: 23.2 % — ABNORMAL HIGH (ref 11.5–15.5)
WBC: 8.4 10*3/uL (ref 4.0–10.5)
nRBC: 0 % (ref 0.0–0.2)

## 2021-05-19 LAB — GLUCOSE, CAPILLARY
Glucose-Capillary: 106 mg/dL — ABNORMAL HIGH (ref 70–99)
Glucose-Capillary: 116 mg/dL — ABNORMAL HIGH (ref 70–99)
Glucose-Capillary: 78 mg/dL (ref 70–99)
Glucose-Capillary: 89 mg/dL (ref 70–99)
Glucose-Capillary: 91 mg/dL (ref 70–99)
Glucose-Capillary: 91 mg/dL (ref 70–99)
Glucose-Capillary: 92 mg/dL (ref 70–99)

## 2021-05-19 LAB — APTT: aPTT: 59 seconds — ABNORMAL HIGH (ref 24–36)

## 2021-05-19 NOTE — Progress Notes (Signed)
[facility] PROGRESS NOTE    Progress Note  Ryan [name]  [id number] DOB: Dec 13, 1945 DOA: 04/08/2021 PCP: [name], MD     Brief Narrative:   Ryan [name] is an 75 y.o. 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  In this admission the patient developed significant AKI left hydronephrosis and ATN from hypotension requiring he will dialysis transiently, he was placed on IV heparin for PE and DVT which has now been discontinued and retroperitoneal bleed.  After this was corrected he was restarted on IV heparin and started developing thrombocytopenia, there was a concern for HIT, switched to argatroban oncology consulted work-up negative now on oral Eliquis.  Will consult TOC as will need skilled nursing facility placement.  Assessment/Plan:   Acute respiratory failure with hypoxia: Multifactorial likely due to PE and bacterial pneumonia versus COVID-19 infection Required high flow nasal cannula, we have been able to wean him to room air.    Acute pulmonary embolism/acute lower extremity DVT: In setting of COVID infection, was initially on Coumadin which had to be discontinued due to retroperitoneal hematoma and intramuscular hemorrhage. VQ scan done on 05/08/2019 lower extremity DVT after discussing with vascular surgery he was restarted back on IV heparin.  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 Hemoglobin stable continue to monitor.  Acute thrombocytopenia: Platelets have stabilized in the high  80s to low 90s. HIT work-up has been negative serotonin release assay and HIT panel are negative. Transition to oral Eliquis Oncology on board and appreciate assistance.  Dysphagia possible thrush: Completed 10-day course of nystatin. He is tolerating his diet.  Possible aspiration: Patient completed course of IV vancomycin since then. Speech reevaluated the patient recommended a d regular diet.  COVID-19 pneumonia/multifocal pneumonia: Records of remdesivir and steroids. Resolved.  Poor oral intake: Core track was placed temporarily as he had poor oral intake, he pulled out his core track by accident. His eating habits continue to improve which is a good sign we will continue well-balanced diet.  Right critical lower limb ischemia: Vascular surgery was reconsulted and he is status post surgical intervention on 05/12/2021 with an above-the-knee amputation. Further management per vascular surgery.  Acute blood loss anemia/retroperitoneal hematoma: CT scan of the abdomen pelvis on 04/18/2021 showed significantly large retroperitoneal hematoma Coumadin was stopped he was given vitamin K and FFP for reversal of his INR. CT scan on 04/24/2021 that showed unchanged.  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 Patient is status post post percutaneous nephrostomy tube placement on 04/20/2021, urology recommended to continue nephrostomy tube for 6 weeks. Peak at 5.3 nephrology was consulted transiently started on hemodialysis. Creatinine has returned to baseline with good urine output, HD catheter has been discontinued.  Left hydronephrosis: Left percutaneous nephrostomy tube placed on 04/20/2021, they recommended to continue for at least 2 months and follow-up with urology as an outpatient.  Goals of care: Will need to consult hospice and Perative care to  started discussing goals of care.  Chronic systolic heart failure: Appears to be euvolemic.  Permanent atrial fibrillation with a history of a bioprosthetic mitral valve: Currently on Eliquis and amiodarone stable.  Diabetes mellitus type 2: With an A1c of 5.4.  Essential hypertension: Continue to hold antihypertensive medication, seems stable.  Sacral decubitus pressure injury of skin stage II: RN Pressure Injury Documentation: Pressure Injury 04/21/21 Coccyx Medial Stage 2 -  Partial thickness loss of dermis presenting as a shallow open injury with a red, pink wound bed without slough. (Active)  04/21/21 1255  Location: Coccyx  Location Orientation: Medial  Staging: Stage 2 -  Partial thickness loss of dermis presenting as a shallow open injury with a red, pink wound bed without slough.  Wound Description (Comments):   Present on Admission: No     DVT prophylaxis: Eliquis Family Communication:none Status is: Inpatient  Remains inpatient appropriate because:Hemodynamically unstable  Dispo: The patient is from: Home              Anticipated d/c is to: SNF              Patient currently is not medically stable to d/c.   Difficult to place patient No        Code Status:     Code Status Orders  (From admission, onward)           Start     Ordered   04/08/21 1211  Full code  Continuous        04/08/21 1212           Code Status History     Date Active Date Inactive Code Status Order ID Comments User Context   03/06/2021 1816 03/13/2021 0325 Full Code [id number]  Ortencia Kick Inpatient   02/25/2021 1202 02/25/2021 2000 Full Code [id number]  Cherre Robins, MD Inpatient   01/30/2021 1513 01/30/2021 2320 Full Code [id number]  Larey Dresser, MD Inpatient   08/26/2020 1818 09/06/2020 2203 Full Code [id number]  Ledora Bottcher, [place] Inpatient   06/11/2016 1627 06/12/2016 1608 Full Code [id number]  Constance Haw, MD Inpatient   08/30/2015 0114  08/30/2015 1540 Full Code [id number]  Edwin Dada, MD Inpatient   09/29/2012 1803 10/06/2012 2104 Full Code [id number]  Cephus Richer, RN Inpatient   09/26/2012 1331 09/29/2012 1803 Full Code [id number]  Lenox Ahr, RN Inpatient      Advance Directive Documentation    Flowsheet Row Most Recent Value  Type of Advance Directive Living will  Pre-existing out of facility DNR order (yellow form or pink MOST form) --  "MOST" Form in Place? --         IV Access:   Peripheral IV   Procedures and diagnostic studies:   No results found.   Medical Consultants:   None.   Subjective:    Linna Hoff awake tolerating his diet.  Objective:    Vitals:   05/18/21 2126 05/19/21 0009 05/19/21 0332 05/19/21 0732  BP: 129/69 123/73 (!) 141/74 122/79  Pulse: 88 87 80 66  Resp: '20 20 20 19  '$ Temp: 97.8 F (36.6 C) 97.8 F (36.6 C) 97.8 F (36.6 C) 98.2 F (36.8 C)  TempSrc: Oral Oral Oral Oral  SpO2: 96% 98% 98% 100%  Weight:   78.5 kg   Height:       SpO2: 100 % O2 Flow Rate (L/min): 0 L/min FiO2 (%): (S) 60 %   Intake/Output Summary (Last 24 hours) at 05/19/2021 0830 Last data filed at 05/19/2021 0509 Gross per 24 hour  Intake 600 ml  Output 1125 ml  Net -525 ml    Filed Weights   05/17/21 0500 05/18/21 0332 05/19/21 0332  Weight: 77.6 kg 78.9 kg 78.5 kg    Exam: General exam: In no acute distress. Respiratory system: Good air movement and clear to auscultation. Cardiovascular system: S1 & S2 heard, RRR. No JVD. Gastrointestinal system: Abdomen is nondistended, soft and nontender.  Extremities: No pedal edema. Skin: No rashes, lesions or ulcers  Data Reviewed:    Labs: Basic Metabolic Panel: Recent Labs  Lab 05/13/21 0444  NA 141  K 4.6  CL 112*  CO2 23  GLUCOSE 98  BUN 31*  CREATININE 1.23  CALCIUM 8.2*    GFR Estimated Creatinine Clearance: 55.2 mL/min (by C-G formula based on SCr of 1.23 mg/dL). Liver Function  Tests: Recent Labs  Lab 05/17/21 0600  AST 36  ALT 28  ALKPHOS 77  BILITOT 1.2  PROT 5.7*  ALBUMIN 2.1*    No results for input(s): LIPASE, AMYLASE in the last 168 hours. No results for input(s): AMMONIA in the last 168 hours. Coagulation profile No results for input(s): INR, PROTIME in the last 168 hours. COVID-19 Labs  No results for input(s): DDIMER, FERRITIN, LDH, CRP in the last 72 hours.  Lab Results  Component Value Date   SARSCOV2NAA POSITIVE (A) 04/28/2021   [place] NEGATIVE 04/08/2021   [place] NEGATIVE 03/03/2021   [place] NEGATIVE 01/28/2021    CBC: Recent Labs  Lab 05/15/21 0255 05/16/21 0430 05/17/21 0600 05/18/21 0233 05/19/21 0500  WBC 10.6* 9.9 7.4 8.5 8.4  HGB 8.0* 7.7* 7.6* 8.4* 8.5*  HCT 26.3* 24.8* 25.7* 27.0* 27.4*  MCV 81.9 80.5 82.1 79.6* 79.4*  PLT 81* 92* 88* 92* 86*    Cardiac Enzymes: No results for input(s): CKTOTAL, CKMB, CKMBINDEX, TROPONINI in the last 168 hours. BNP (last 3 results) Recent Labs    08/25/20 1121 03/27/21 1123  PROBNP 21,395* 3,146*    CBG: Recent Labs  Lab 05/18/21 1634 05/18/21 2124 05/19/21 0011 05/19/21 0404 05/19/21 0730  GLUCAP 107* 88 116* 92 89    D-Dimer: No results for input(s): DDIMER in the last 72 hours. Hgb A1c: No results for input(s): HGBA1C in the last 72 hours. Lipid Profile: No results for input(s): CHOL, HDL, LDLCALC, TRIG, CHOLHDL, LDLDIRECT in the last 72 hours. Thyroid function studies: No results for input(s): TSH, T4TOTAL, T3FREE, THYROIDAB in the last 72 hours.  Invalid input(s): FREET3 Anemia work up: No results for input(s): VITAMINB12, FOLATE, FERRITIN, TIBC, IRON, RETICCTPCT in the last 72 hours. Sepsis Labs: Recent Labs  Lab 05/16/21 0430 05/17/21 0600 05/18/21 0233 05/19/21 0500  WBC 9.9 7.4 8.5 8.4    Microbiology No results found for this or any previous visit (from the past 240 hour(s)).   Medications:    amiodarone  200 mg Oral  Daily   apixaban  5 mg Oral Q12H   vitamin C  500 mg Oral Daily   atorvastatin  40 mg Oral q1800   benzonatate  100 mg Oral TID   Chlorhexidine Gluconate Cloth  6 each Topical Daily   dextromethorphan-guaiFENesin  1 tablet Oral BID   docusate sodium  100 mg Oral BID   feeding supplement  237 mL Oral TID BM   insulin aspart  0-15 Units Subcutaneous Q4H   midodrine  10 mg Oral TID WC   multivitamin with minerals  1 tablet Oral Daily   pantoprazole  40 mg Oral Daily   sodium  chloride flush  10-40 mL Intracatheter Q12H   zinc sulfate  220 mg Oral Daily   Continuous Infusions:      LOS: 41 days   Charlynne Cousins  [facility]  05/19/2021, 8:30 AM

## 2021-05-19 NOTE — Discharge Instructions (Signed)

## 2021-05-19 NOTE — Progress Notes (Signed)
  Speech Language Pathology Treatment: Dysphagia  Patient Details Name: Ryan [name] MRN: [id number] DOB: [birth year] Today's Date: 05/19/2021 Time: 0900-0910 SLP Time Calculation (min) (ACUTE ONLY): 10 min  Assessment / Plan / Recommendation Clinical Impression  Pt observed with Dys 2 meal try in place, but pt unable to self feed due to position. SLP repositioned, but pt not very interested in meal because it was cold and could not be reheated. RN reports he eats well when family is present to feed him. Pt tolerates liquids well without signs of aspiration; though a baseline cough is still present. Pt still has prolonged mastication and says he wants his food to remain soft. No further acute needs. Suggested to RN that staff provide meal set up though pt does say he can feed himself. Will sign off.    HPI HPI: Patient is a 75  y.o. male with PMH: atrial fibrillation on Coumadin mitral valve replacement, chronic systolic heart failure with an AICD and placed, essential hypertension peripheral vascular disease, also history of peripheral vascular bypass with an ongoing wound infection initially hospitalized for foot debridement and possible BKA. He had been scheduled to have debridement on 04/08/2021 but due to an INR of 3.2 surgery was postponed.  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 He developed a retroperitoneal hematoma with left kidney displacement and hypotension developed ATN and hyperkalemia, nephrology was consulted recommended renal replacement therapy, urology was consulted due to his left hydronephrosis would like recommended a left nephrostomy tube.  Nephrostomy tube placed on 8/1 and central line placement for HD.  Patient did well after 1 HD treatment and renal function recovered.  During screening status for SNF placement, patient tested positive for COVID-19. Evening of  8/13, patient had significantly increased oxygen requirement and in AM of 8/14 was on 25L/min 100% FiO2 HFNC plus nonrebreather. Cortrak in place to supplement nutrition. SLP ordered to assess swallow function due to patient reporting difficulty swallowing at times as well as endorsing a productive cough.      SLP Plan  All goals met       Recommendations  Diet recommendations: Dysphagia 2 (fine chop);Thin liquid Liquids provided via: Straw Medication Administration: Whole meds with liquid Supervision: Staff to assist with self feeding Compensations: Minimize environmental distractions Postural Changes and/or Swallow Maneuvers: Seated upright 90 degrees                Plan: All goals met       GO                Jaleiyah Alas, Katherene Ponto 05/19/2021, 10:13 AM

## 2021-05-19 NOTE — Progress Notes (Signed)
Physical Therapy Treatment Patient Details Name: Ryan [name] MRN: [id number] DOB: 1946-05-25 Today's Date: 05/19/2021    History of Present Illness 75 y.o. male admitted 04/08/21 for R foot debridement and now R AK amp on 8/23.  R IJ temp HD catheter placement on 8/1.  Pt with L sided hydronephrosis possibly secondary to L retroperitoneal hematoma, s/p nephrostomy tube on 8/1 to drain. Now on airborne/contact precautions, positive Covid-19 test collected 8/9.  PMH: Afib on Coumadin mitral valve replacement, chronic systolic heart failure with an AICD and placed, essential HTN, PVD,  peripheral vascular bypass.    PT Comments    Patient received in bed, he is agreeable to PT. Patient requires max assist for supine to sit. Max +2 for attempting sit to stand or transfer to chair. Patient requires max verbal cues for mobility. He is limited by global weakness. Patient will continue to benefit from skilled PT while here to improve strength and functional independence.      Follow Up Recommendations  SNF;Supervision/Assistance - 24 hour     Equipment Recommendations  Wheelchair cushion (measurements PT);Wheelchair (measurements PT)    Recommendations for Other Services       Precautions / Restrictions Precautions Precautions: Fall Precaution Comments: Airborne/Contact precs, L nephrostomy tube/drain Other Brace: L nephrostomy tube/drain Restrictions Weight Bearing Restrictions: Yes RLE Weight Bearing: Non weight bearing LLE Weight Bearing: Weight bearing as tolerated    Mobility  Bed Mobility Overal bed mobility: Needs Assistance Bed Mobility: Supine to Sit;Sit to Supine     Supine to sit: HOB elevated;Max assist Sit to supine: Max assist;+2 for physical assistance   General bed mobility comments: decreased initiation, cues needed for how to assist.    Transfers Overall transfer level: Needs assistance Equipment used: Rolling walker (2 wheeled) Transfers: Sit to/from  Stand Sit to Stand: Max assist;+2 physical assistance;From elevated surface         General transfer comment: patient attempted to stand x2 reps. On second attempt he was able to clear bottom but could not get standing all the way up  Ambulation/Gait             General Gait Details: unable   Stairs             Wheelchair Mobility    Modified Rankin (Stroke Patients Only)       Balance Overall balance assessment: Needs assistance Sitting-balance support: Feet supported;Bilateral upper extremity supported Sitting balance-Leahy Scale: Poor Sitting balance - Comments: poor initial sitting balance requiring assist to maintain. B UE support. Requires assist to get left leg positioned to assist with supporting him in sitting. Postural control: Left lateral lean Standing balance support: Bilateral upper extremity supported;During functional activity Standing balance-Leahy Scale: Zero Standing balance comment: max assist+2  in the RW for partial stand                            Cognition Arousal/Alertness: Awake/alert Behavior During Therapy: WFL for tasks assessed/performed   Area of Impairment: Safety/judgement;Problem solving                       Following Commands: Follows one step commands with increased time Safety/Judgement: Decreased awareness of safety;Decreased awareness of deficits Awareness: Intellectual Problem Solving: Slow processing;Decreased initiation;Requires verbal cues;Requires tactile cues;Difficulty sequencing        Exercises Other Exercises Other Exercises: AP, SLR, heel slides, hip abd/add x 10 reps on left with assist.  General Comments        Pertinent Vitals/Pain Pain Assessment: 0-10 Pain Score: 6  Pain Descriptors / Indicators: Operative site guarding;Guarding;Grimacing;Tender Pain Intervention(s): RN gave pain meds during session;Repositioned;Monitored during session    Home Living                       Prior Function            PT Goals (current goals can now be found in the care plan section) Acute Rehab PT Goals Patient Stated Goal: to hurt less PT Goal Formulation: With patient Time For Goal Achievement: 05/29/21 Potential to Achieve Goals: Fair Progress towards PT goals: Progressing toward goals    Frequency    Min 2X/week      PT Plan Current plan remains appropriate    Co-evaluation              AM-PAC PT "6 Clicks" Mobility   Outcome Measure  Help needed turning from your back to your side while in a flat bed without using bedrails?: A Lot Help needed moving from lying on your back to sitting on the side of a flat bed without using bedrails?: A Lot Help needed moving to and from a bed to a chair (including a wheelchair)?: Total Help needed standing up from a chair using your arms (e.g., wheelchair or bedside chair)?: Total Help needed to walk in hospital room?: Total Help needed climbing 3-5 steps with a railing? : Total 6 Click Score: 8    End of Session Equipment Utilized During Treatment: Gait belt Activity Tolerance: Patient limited by fatigue Patient left: in bed;with call bell/phone within reach Nurse Communication: Mobility status;Need for lift equipment PT Visit Diagnosis: Other abnormalities of gait and mobility (R26.89);Muscle weakness (generalized) (M62.81);Pain Pain - Right/Left: Right Pain - part of body: Leg     Time: [id number] PT Time Calculation (min) (ACUTE ONLY): 40 min  Charges:  $Therapeutic Exercise: 8-22 mins $Therapeutic Activity: 23-37 mins                    Ryan [name], PT, GCS 05/19/21,12:15 PM

## 2021-05-19 NOTE — Progress Notes (Signed)
Mobility Specialist: Progress Note   05/19/21 1238  Mobility  Activity Stood at bedside  Level of Assistance +2 (takes two people)  Company secretary Sit up in bed/chair position for meals  Mobility Response Tolerated poorly  Mobility performed by Mobility specialist;Other (comment) (PT Robert Bellow)  $Mobility charge 1 Mobility   Pt required +2 physical assistance to stand and was only able to perform 1/4 stand first attempt and 1/2 stand second attempt d/t deconditioning. Pt back in bed with call bell at his side.   [facility] Merritt Mccravy Mobility Specialist Mobility Specialist Phone: [phone]

## 2021-05-20 ENCOUNTER — Other Ambulatory Visit (HOSPITAL_COMMUNITY): Payer: Self-pay

## 2021-05-20 LAB — CBC
HCT: 28.3 % — ABNORMAL LOW (ref 39.0–52.0)
Hemoglobin: 8.4 g/dL — ABNORMAL LOW (ref 13.0–17.0)
MCH: 24 pg — ABNORMAL LOW (ref 26.0–34.0)
MCHC: 29.7 g/dL — ABNORMAL LOW (ref 30.0–36.0)
MCV: 80.9 fL (ref 80.0–100.0)
Platelets: 82 10*3/uL — ABNORMAL LOW (ref 150–400)
RBC: 3.5 MIL/uL — ABNORMAL LOW (ref 4.22–5.81)
RDW: 23.5 % — ABNORMAL HIGH (ref 11.5–15.5)
WBC: 8.5 10*3/uL (ref 4.0–10.5)
nRBC: 0 % (ref 0.0–0.2)

## 2021-05-20 LAB — GLUCOSE, CAPILLARY
Glucose-Capillary: 86 mg/dL (ref 70–99)
Glucose-Capillary: 71 mg/dL (ref 70–99)
Glucose-Capillary: 112 mg/dL — ABNORMAL HIGH (ref 70–99)
Glucose-Capillary: 114 mg/dL — ABNORMAL HIGH (ref 70–99)
Glucose-Capillary: 112 mg/dL — ABNORMAL HIGH (ref 70–99)

## 2021-05-20 LAB — APTT: aPTT: 42 seconds — ABNORMAL HIGH (ref 24–36)

## 2021-05-20 MED ORDER — ALTEPLASE 2 MG IJ SOLR
2.0000 mg | Freq: Once | INTRAMUSCULAR | Status: DC
  Filled 2021-05-20: qty 2

## 2021-05-20 NOTE — Plan of Care (Signed)
  Problem: Education: Goal: Knowledge of General Education information will improve Description: Including pain rating scale, medication(s)/side effects and non-pharmacologic comfort measures 05/20/2021 2251 by Bernardo Heater, RN Outcome: Progressing 05/20/2021 2251 by Bernardo Heater, RN Outcome: Progressing

## 2021-05-20 NOTE — Plan of Care (Signed)
  Problem: Health Behavior/Discharge Planning: Goal: Ability to manage health-related needs will improve Outcome: Progressing   

## 2021-05-20 NOTE — Progress Notes (Addendum)
PROGRESS NOTE    Ryan [name]  [id number] DOB: 1946-09-02 DOA: 04/08/2021 PCP: [name], MD    Brief Narrative:  Ryan [name] is a 75 year old male with past medical history significant for atrial fibrillation, mitral valve replacement on Coumadin, CKD stage IIIb, type 2 diabetes mellitus, chronic systolic congestive heart failure s/p AICD, essential hypertension, peripheral vascular disease who presented to [facility] ED on 7/20 with concerns for foot infection per recommendations per vascular surgery clinic.  Vascular surgery was consulted and patient underwent partial right foot amputation with excisional debridement and wound VAC placement.  Unfortunately patient continued to show evidence of worsening limb ischemia and patient now underwent left AKA.  Patient is currently stable and waiting SNF placement.   Assessment & Plan:   Principal Problem:   Critical lower limb ischemia (HCC) Active Problems:   Chronic systolic heart failure (HCC)   Status post mitral valve replacement with bioprosthetic valve   Diabetes mellitus ([place])   Essential hypertension   ICD (implantable cardioverter-defibrillator) in place   Stage 3 chronic kidney disease (HCC)   Pressure injury of skin   Right lower extremity critical limb ischemia Osteomyelitis Patient presenting with wound to right toe, chronically has been seeing vascular surgery and podiatry outpatient.  Vascular surgery was consulted and followed during hospital course.  Patient underwent right first toe partial amputation, right second toe amputation and right heel excisional debridement on 04/09/2021 by Dr. Standley Dakins.  Angiogram on 04/13/2021 by Dr. Donzetta Matters with findings of left common femoral artery, SFA patent with heavy calcification, above the popliteal artery approximate 30% stenosis, below-knee runoff dominant via peroneal artery but ends at the ankle, posterior tibial artery occludes mid calf, anterior tibial artery occludes after several  centimeters and there is no indication for intervention of these vessels.  Fortunately patient continues to have a nonsalvageable right lower extremity and patient underwent right above-the-knee amputation by Dr.Dickson on 05/12/2021. --Atorvastatin 40 mg p.o. daily --Eliquis 5 mg p.o. twice daily --Hydrocodone-acetaminophen 7.5-325 mg p.o. every 6 hours as needed moderate pain --Continue PT/OT efforts while inpatient, nonweightbearing right lower extremity --Outpatient follow-up with vascular surgery in 4 weeks for staple removal --Pending SNF placement  Acute respiratory failure with hypoxia: Resolved. Etiology likely mild type factorial given pulmonary embolism and bacterial pneumonia versus COVID-19 pneumonia.  Initially required high flow nasal cannula.  Completed antibiotic course and started on anticoagulation.  Oxygen now has been weaned off.  Acute pulmonary embolism Acute lower extremity DVT Vascular duplex ultrasound left lower extremity positive for acute thrombus left external iliac vein.  Nuclear medicine VQ scan 05/05/2021 with positive wedge-shaped perfusion defects right upper/right lower lobe consistent with pulmonary emboli. --Eliquis 5 mg p.o. twice daily  Left hydronephrosis 2/2 large left retroperitoneal hematoma Urology, Dr. Jeffie Pollock was consulted and recommended percutaneous nephrostomy placement.  Interventional radiology was consulted underwent image guided left percutaneous drain placement on 04/20/2021 by Dr. Earleen Newport. --Continue monitor urine output --Continue percutaneous nephrostomy tube for least 2 months, outpatient follow-up with urology   Acute renal failure on CKD3b 2/2 ATN from hypotension with bleeding from hematoma Patient developed acute renal failure during hospitalization with creatinine peaking at 5.07 with a baseline creatinine of 1.5 -2.0.  CT abdomen/pelvis 7/30 with 17 x 17 x 7 cm left retroperitoneal fluid collection likely secondary to large hematoma  with mass-effect on the left ureter with moderate left hydronephrosis.  Etiology likely secondary to hypotension from retroperitoneal hematoma with associated left hydronephrosis.  Patient underwent percutaneous nephrostomy as  above.  Nephrology was also consulted and patient initially underwent intermittent HD which has now been discontinued. --Cr 1.60>>5.07>>1.45>1.29>1.13>1.23 --Avoid nephrotoxins, renally dose all medications  Chronic systolic congestive heart failure, compensated Essential hypertension On Lasix 20 mg p.o. daily at home.  TTE 8/16 with LVEF 25-30% with LV severely decreased function, LA moderately dilated, RA mildly dilated, TR moderate, IVC normal in size. --Currently holding home furosemide --Strict I's and O's and daily weights  Permanent atrial fibrillation History of bioprosthetic mitral valve --Amiodarone 200 mg p.o. daily --Eliquis 5 mg p.o. twice daily  Type 2 diabetes mellitus Hemoglobin A1c 5.4, well controlled.  On Jardiance 10 mg p.o. daily at home. --SSI for coverage --CBGs qAC/HS charge  Acute thrombocytopenia During hospitalization, platelet count decreased after he was started on IV heparin.  IV heparin was changed to a garter band.  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 --Platelet count 82 today, stable. --Now started on Eliquis for anticoagulation  Dysphagia secondary to thrush Completed 10-day course of nystatin.  Currently tolerating diet.  Covid-19 viral pneumonia Completed course of remdesivir and steroids.  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  Outpatient follow-up with cardiology.  Poor oral intake Core track was initially placed temporary due to poor oral intake.  He now has been discontinued from core track after pulling it out.  His eating habits continue to improve. --Continue regular diet  Stage II  sacral decubitus pressure ulcer, POA Pressure Injury 04/21/21 Coccyx Medial Stage 2 -  Partial thickness loss of dermis presenting as a shallow open injury with a red, pink wound bed without slough. (Active)  04/21/21 1255  Location: Coccyx  Location Orientation: Medial  Staging: Stage 2 -  Partial thickness loss of dermis presenting as a shallow open injury with a red, pink wound bed without slough.  Wound Description (Comments):   Present on Admission: No  --Continue local wound care, offloading     DVT prophylaxis:  apixaban (ELIQUIS) tablet 5 mg    Code Status: Full Code Family Communication: none present at bedside this am  Disposition Plan:  Level of care: Telemetry Medical Status is: Inpatient  Remains inpatient appropriate because:Unsafe d/c plan  Dispo: The patient is from: Home              Anticipated d/c is to: SNF              Patient currently is medically stable to d/c.   Difficult to place patient Yes    Consultants:  Vascular surgery Palliative care Medical oncology/hematology, Dr. Payton Mccallum Nephrology, Dr. Posey Pronto, Dr. Osborne Casco Urology, Dr. Jeffie Pollock Interventional radiology  Procedures:  Right first toe partial amputation, right second toe amputation, excisional debridement right heel 7/21, Dr. Standley Dakins Angiogram 7/25, Dr. Donzetta Matters Right AKA 8/23  Antimicrobials:  Vancomycin 8/15-8/17 Ceftriaxone 7/20-7/25, 8/1-8/8 Zosyn 8/14-8/17 Metronidazole 7/20-7/25 Cefuroxime 8/18-8/20 Remdesivir 8/10-8/15     Subjective: Patient seen examined bedside, resting comfortably.  No complaints this morning.  Continues with increased oral intake.  Awaiting SNF placement.  No other questions or concerns at this time.  No family present at bedside this morning.  Denies headache, no fever/chills/night sweats, no nausea/vomiting/diarrhea, no chest pain, palpitations, no shortness of breath, no abdominal pain, no weakness, no fatigue, no paresthesias.  No acute events overnight  per nursing staff.   Objective: Vitals:   05/20/21 0337 05/20/21 0800 05/20/21 1200 05/20/21 1548  BP: 130/68 138/64 125/69 124/65  Pulse: 70 72 73 77  Resp: '18 19 19 20  '$ Temp: 98.2 F (36.8 C) 98 F (36.7 C) 98.2 F (36.8 C) 98.5 F (36.9 C)  TempSrc: Oral Oral Oral Oral  SpO2: 97% 98% 98% 94%  Weight: 78.2 kg     Height:        Intake/Output Summary (Last 24 hours) at 05/20/2021 1741 Last data filed at 05/20/2021 1400 Gross per 24 hour  Intake 300 ml  Output 1250 ml  Net -950 ml   Filed Weights   05/18/21 0332 05/19/21 0332 05/20/21 0337  Weight: 78.9 kg 78.5 kg 78.2 kg    Examination:  General exam: Appears calm and comfortable, appears older than stated age Respiratory system: Clear to auscultation. Respiratory effort normal.  On room air Cardiovascular system: S1 & S2 heard, RRR. No JVD, murmurs, rubs, gallops or clicks. No pedal edema. Gastrointestinal system: Abdomen is nondistended, soft and nontender. No organomegaly or masses felt. Normal bowel sounds heard. GU: Nephrostomy tube noted with clear yellow urine in collection bag Central nervous system: Alert and oriented. No focal neurological deficits. Extremities: Symmetric 5 x 5 power.  Noted right AKA with staples in place, wound well approximated, no fluctuance/surrounding erythema Skin: No rashes, lesions or ulcers Psychiatry: Judgement and insight appear poor. Mood & affect appropriate.     Data Reviewed: I have personally reviewed following labs and imaging studies  CBC: Recent Labs  Lab 05/16/21 0430 05/17/21 0600 05/18/21 0233 05/19/21 0500 05/20/21 0500  WBC 9.9 7.4 8.5 8.4 8.5  HGB 7.7* 7.6* 8.4* 8.5* 8.4*  HCT 24.8* 25.7* 27.0* 27.4* 28.3*  MCV 80.5 82.1 79.6* 79.4* 80.9  PLT 92* 88* 92* 86* 82*   Basic Metabolic Panel: No results for input(s): NA, K, CL, CO2, GLUCOSE, BUN, CREATININE, CALCIUM, MG, PHOS in the last 168 hours. GFR: Estimated Creatinine Clearance: 55.2 mL/min (by C-G  formula based on SCr of 1.23 mg/dL). Liver Function Tests: Recent Labs  Lab 05/17/21 0600  AST 36  ALT 28  ALKPHOS 77  BILITOT 1.2  PROT 5.7*  ALBUMIN 2.1*   No results for input(s): LIPASE, AMYLASE in the last 168 hours. No results for input(s): AMMONIA in the last 168 hours. Coagulation Profile: No results for input(s): INR, PROTIME in the last 168 hours. Cardiac Enzymes: No results for input(s): CKTOTAL, CKMB, CKMBINDEX, TROPONINI in the last 168 hours. BNP (last 3 results) Recent Labs    08/25/20 1121 03/27/21 1123  PROBNP 21,395* 3,146*   HbA1C: No results for input(s): HGBA1C in the last 72 hours. CBG: Recent Labs  Lab 05/19/21 2346 05/20/21 0459 05/20/21 0813 05/20/21 1159 05/20/21 1647  GLUCAP 91 86 71 112* 114*   Lipid Profile: No results for input(s): CHOL, HDL, LDLCALC, TRIG, CHOLHDL, LDLDIRECT in the last 72 hours. Thyroid Function Tests: No results for input(s): TSH, T4TOTAL, FREET4, T3FREE, THYROIDAB in the last 72 hours. Anemia Panel: No results for input(s): VITAMINB12, FOLATE, FERRITIN, TIBC, IRON, RETICCTPCT in the last 72 hours. Sepsis Labs: No results for input(s): PROCALCITON, LATICACIDVEN in the last 168 hours.  No results found for this or any previous visit (from the past 240 hour(s)).       Radiology Studies: No results found.      Scheduled Meds:  alteplase  2 mg Intracatheter Once   amiodarone  200 mg Oral Daily   apixaban  5 mg Oral Q12H   atorvastatin  40 mg Oral q1800  Chlorhexidine Gluconate Cloth  6 each Topical Daily   docusate sodium  100 mg Oral BID   feeding supplement  237 mL Oral TID BM   insulin aspart  0-15 Units Subcutaneous Q4H   midodrine  10 mg Oral TID WC   multivitamin with minerals  1 tablet Oral Daily   pantoprazole  40 mg Oral Daily   zinc sulfate  220 mg Oral Daily   Continuous Infusions:   LOS: 42 days    Time spent: 39 minutes spent on chart review, discussion with nursing staff,  consultants, updating family and interview/physical exam; more than 50% of that time was spent in counseling and/or coordination of care.    Cherokee Boccio J [place] DO [facility] Available via Epic secure chat 7am-7pm After these hours, please refer to coverage provider listed on [email or web address] 05/20/2021, 5:41 PM

## 2021-05-20 NOTE — TOC Benefit Eligibility Note (Signed)
Patient Teacher, English as a foreign language completed.    The patient is currently admitted and upon discharge could be taking Eliquis 5 mg.  The current 30 day co-pay is, $83.97.   The patient is insured through [place], [place] Patient Advocate Specialist [place] Team Direct Number: [phone]  Fax: [phone]

## 2021-05-21 LAB — GLUCOSE, CAPILLARY
Glucose-Capillary: 101 mg/dL — ABNORMAL HIGH (ref 70–99)
Glucose-Capillary: 89 mg/dL (ref 70–99)
Glucose-Capillary: 92 mg/dL (ref 70–99)
Glucose-Capillary: 94 mg/dL (ref 70–99)
Glucose-Capillary: 96 mg/dL (ref 70–99)
Glucose-Capillary: 96 mg/dL (ref 70–99)
Glucose-Capillary: 97 mg/dL (ref 70–99)

## 2021-05-21 LAB — SARS CORONAVIRUS 2 (TAT 6-24 HRS): SARS Coronavirus 2: POSITIVE — AB

## 2021-05-21 LAB — CBC
HCT: 28.3 % — ABNORMAL LOW (ref 39.0–52.0)
Hemoglobin: 8.6 g/dL — ABNORMAL LOW (ref 13.0–17.0)
MCH: 24 pg — ABNORMAL LOW (ref 26.0–34.0)
MCHC: 30.4 g/dL (ref 30.0–36.0)
MCV: 79.1 fL — ABNORMAL LOW (ref 80.0–100.0)
Platelets: 79 10*3/uL — ABNORMAL LOW (ref 150–400)
RBC: 3.58 MIL/uL — ABNORMAL LOW (ref 4.22–5.81)
RDW: 23 % — ABNORMAL HIGH (ref 11.5–15.5)
WBC: 10.6 10*3/uL — ABNORMAL HIGH (ref 4.0–10.5)
nRBC: 0 % (ref 0.0–0.2)

## 2021-05-21 LAB — APTT: aPTT: 45 seconds — ABNORMAL HIGH (ref 24–36)

## 2021-05-21 NOTE — Progress Notes (Signed)
PROGRESS NOTE    Ryan [name]  [id number] DOB: [birth year] DOA: 04/08/2021 PCP: [name], MD    Brief Narrative:  Ryan [name] is a 75 year old male with past medical history significant for atrial fibrillation, mitral valve replacement on Coumadin, CKD stage IIIb, type 2 diabetes mellitus, chronic systolic congestive heart failure s/p AICD, essential hypertension, peripheral vascular disease who presented to [facility] ED on 7/20 with concerns for foot infection per recommendations per vascular surgery clinic.  Vascular surgery was consulted and patient underwent partial right foot amputation with excisional debridement and wound VAC placement.  Unfortunately patient continued to show evidence of worsening limb ischemia and patient now underwent left AKA.  Patient is currently stable and waiting SNF placement.   Assessment & Plan:   Principal Problem:   Critical lower limb ischemia (HCC) Active Problems:   Chronic systolic heart failure (HCC)   Status post mitral valve replacement with bioprosthetic valve   Diabetes mellitus ([place])   Essential hypertension   ICD (implantable cardioverter-defibrillator) in place   Stage 3 chronic kidney disease (HCC)   Pressure injury of skin   Right lower extremity critical limb ischemia Osteomyelitis Patient presenting with wound to right toe, chronically has been seeing vascular surgery and podiatry outpatient.  Vascular surgery was consulted and followed during hospital course.  Patient underwent right first toe partial amputation, right second toe amputation and right heel excisional debridement on 04/09/2021 by Dr. Standley Dakins.  Angiogram on 04/13/2021 by Dr. Donzetta Matters with findings of left common femoral artery, SFA patent with heavy calcification, above the popliteal artery approximate 30% stenosis, below-knee runoff dominant via peroneal artery but ends at the ankle, posterior tibial artery occludes mid calf, anterior tibial artery occludes after several  centimeters and there is no indication for intervention of these vessels.  Fortunately patient continues to have a nonsalvageable right lower extremity and patient underwent right above-the-knee amputation by Dr.Dickson on 05/12/2021. --Atorvastatin 40 mg p.o. daily --Eliquis 5 mg p.o. twice daily --Hydrocodone-acetaminophen 7.5-325 mg p.o. every 6 hours as needed moderate pain --Continue PT/OT efforts while inpatient, nonweightbearing right lower extremity --Outpatient follow-up with vascular surgery in 4 weeks for staple removal --Pending insurance authorization for [facility] SN[facility]  Acute respiratory failure with hypoxia: Resolved. Etiology likely mild type factorial given pulmonary embolism and bacterial pneumonia versus COVID-19 pneumonia.  Initially required high flow nasal cannula.  Completed antibiotic course and started on anticoagulation.  Oxygen now has been weaned off.  Acute pulmonary embolism Acute lower extremity DVT Vascular duplex ultrasound left lower extremity positive for acute thrombus left external iliac vein.  Nuclear medicine VQ scan 05/05/2021 with positive wedge-shaped perfusion defects right upper/right lower lobe consistent with pulmonary emboli. --Eliquis 5 mg p.o. twice daily  Left hydronephrosis 2/2 large left retroperitoneal hematoma Urology, Dr. Jeffie Pollock was consulted and recommended percutaneous nephrostomy placement.  Interventional radiology was consulted underwent image guided left percutaneous drain placement on 04/20/2021 by Dr. Earleen Newport. --Continue monitor urine output --Continue percutaneous nephrostomy tube for least 2 months, outpatient follow-up with urology   Acute renal failure on CKD3b 2/2 ATN from hypotension with bleeding from hematoma Patient developed acute renal failure during hospitalization with creatinine peaking at 5.07 with a baseline creatinine of 1.5 -2.0.  CT abdomen/pelvis 7/30 with 17 x 17 x 7 cm left retroperitoneal fluid collection likely secondary  to large hematoma with mass-effect on the left ureter with moderate left hydronephrosis.  Etiology likely secondary to hypotension from retroperitoneal hematoma with associated left hydronephrosis.  Patient underwent  percutaneous nephrostomy as above.  Nephrology was also consulted and patient initially underwent intermittent HD which has now been discontinued. --Cr 1.60>>5.07>>1.45>1.29>1.13>1.23 --Avoid nephrotoxins, renally dose all medications  Chronic systolic congestive heart failure, compensated Essential hypertension On Lasix 20 mg p.o. daily at home.  TTE 8/16 with LVEF 25-30% with LV severely decreased function, LA moderately dilated, RA mildly dilated, TR moderate, IVC normal in size. --Currently holding home furosemide --Strict I's and O's and daily weights  Permanent atrial fibrillation History of bioprosthetic mitral valve --Amiodarone 200 mg p.o. daily --Eliquis 5 mg p.o. twice daily  Type 2 diabetes mellitus Hemoglobin A1c 5.4, well controlled.  On Jardiance 10 mg p.o. daily at home. --SSI for coverage --CBGs qAC/HS charge  Acute thrombocytopenia During hospitalization, platelet count decreased after he was started on IV heparin.  IV heparin was changed to a garter band.  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 --Platelet count 79 today, stable. --Now started on Eliquis for anticoagulation  Dysphagia secondary to thrush Completed 10-day course of nystatin.  Currently tolerating diet.  Covid-19 viral pneumonia Completed course of remdesivir and steroids.  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  Outpatient follow-up with cardiology.  Poor oral intake Core track was initially placed temporary due to poor oral intake.  He now has been discontinued from core track after pulling it out.  His eating habits continue to improve.  Seen by speech  therapy on 8/30 --Continue dysphagia 2 diet  Stage II sacral decubitus pressure ulcer, POA Pressure Injury 04/21/21 Coccyx Medial Stage 2 -  Partial thickness loss of dermis presenting as a shallow open injury with a red, pink wound bed without slough. (Active)  04/21/21 1255  Location: Coccyx  Location Orientation: Medial  Staging: Stage 2 -  Partial thickness loss of dermis presenting as a shallow open injury with a red, pink wound bed without slough.  Wound Description (Comments):   Present on Admission: No  --Continue local wound care, offloading     DVT prophylaxis:  apixaban (ELIQUIS) tablet 5 mg    Code Status: Full Code Family Communication: none present at bedside this am  Disposition Plan:  Level of care: Telemetry Medical Status is: Inpatient  Remains inpatient appropriate because:Unsafe d/c plan  Dispo: The patient is from: Home              Anticipated d/c is to: SNF              Patient currently is medically stable to d/c.   Difficult to place patient Yes    Consultants:  Vascular surgery Palliative care Medical oncology/hematology, Dr. Payton Mccallum Nephrology, Dr. Posey Pronto, Dr. Osborne Casco Urology, Dr. Jeffie Pollock Interventional radiology  Procedures:  Right first toe partial amputation, right second toe amputation, excisional debridement right heel 7/21, Dr. Standley Dakins Angiogram 7/25, Dr. Donzetta Matters Right AKA 8/23  Antimicrobials:  Vancomycin 8/15-8/17 Ceftriaxone 7/20-7/25, 8/1-8/8 Zosyn 8/14-8/17 Metronidazole 7/20-7/25 Cefuroxime 8/18-8/20 Remdesivir 8/10-8/15     Subjective: Patient seen examined bedside, resting comfortably.  Eating breakfast.  States enjoys his Ensure that does not cause coughing.  He is obviously not sitting fully upright in bed while trying to eat, likely complicating factor.  Nursing student present in room.  No other complaints this morning.  Awaiting insurance authorization for SNF placement..  No other questions or concerns at this time.  No  family present at bedside this morning.  Denies  headache, no fever/chills/night sweats, no nausea/vomiting/diarrhea, no chest pain, palpitations, no shortness of breath, no abdominal pain, no weakness, no fatigue, no paresthesias.  No acute events overnight per nursing staff.   Objective: Vitals:   05/21/21 0354 05/21/21 0700 05/21/21 1100 05/21/21 1213  BP: (!) 112/43 132/71 125/68 134/72  Pulse: 70 70 75 84  Resp: '16 16 18 18  '$ Temp: 98.4 F (36.9 C) 97.8 F (36.6 C) 98.8 F (37.1 C) 97.7 F (36.5 C)  TempSrc: Oral Axillary Oral Oral  SpO2: 91% 97% 93% 94%  Weight:      Height:        Intake/Output Summary (Last 24 hours) at 05/21/2021 1313 Last data filed at 05/21/2021 0900 Gross per 24 hour  Intake 345 ml  Output 850 ml  Net -505 ml   Filed Weights   05/18/21 0332 05/19/21 0332 05/20/21 0337  Weight: 78.9 kg 78.5 kg 78.2 kg    Examination:  General exam: Appears calm and comfortable, appears older than stated age Respiratory system: Clear to auscultation. Respiratory effort normal.  On room air Cardiovascular system: S1 & S2 heard, RRR. No JVD, murmurs, rubs, gallops or clicks. No pedal edema. Gastrointestinal system: Abdomen is nondistended, soft and nontender. No organomegaly or masses felt. Normal bowel sounds heard. GU: Nephrostomy tube noted with clear yellow urine in collection bag Central nervous system: Alert and oriented. No focal neurological deficits. Extremities: Symmetric 5 x 5 power.  Noted right AKA with staples in place, wound well approximated, no fluctuance/surrounding erythema Skin: No rashes, lesions or ulcers Psychiatry: Judgement and insight appear poor. Mood & affect appropriate.     Data Reviewed: I have personally reviewed following labs and imaging studies  CBC: Recent Labs  Lab 05/17/21 0600 05/18/21 0233 05/19/21 0500 05/20/21 0500 05/21/21 0140  WBC 7.4 8.5 8.4 8.5 10.6*  HGB 7.6* 8.4* 8.5* 8.4* 8.6*  HCT 25.7* 27.0* 27.4* 28.3*  28.3*  MCV 82.1 79.6* 79.4* 80.9 79.1*  PLT 88* 92* 86* 82* 79*   Basic Metabolic Panel: No results for input(s): NA, K, CL, CO2, GLUCOSE, BUN, CREATININE, CALCIUM, MG, PHOS in the last 168 hours. GFR: Estimated Creatinine Clearance: 55.2 mL/min (by C-G formula based on SCr of 1.23 mg/dL). Liver Function Tests: Recent Labs  Lab 05/17/21 0600  AST 36  ALT 28  ALKPHOS 77  BILITOT 1.2  PROT 5.7*  ALBUMIN 2.1*   No results for input(s): LIPASE, AMYLASE in the last 168 hours. No results for input(s): AMMONIA in the last 168 hours. Coagulation Profile: No results for input(s): INR, PROTIME in the last 168 hours. Cardiac Enzymes: No results for input(s): CKTOTAL, CKMB, CKMBINDEX, TROPONINI in the last 168 hours. BNP (last 3 results) Recent Labs    08/25/20 1121 03/27/21 1123  PROBNP 21,395* 3,146*   HbA1C: No results for input(s): HGBA1C in the last 72 hours. CBG: Recent Labs  Lab 05/20/21 1950 05/21/21 0005 05/21/21 0356 05/21/21 0817 05/21/21 1217  GLUCAP 112* 89 101* 92 96   Lipid Profile: No results for input(s): CHOL, HDL, LDLCALC, TRIG, CHOLHDL, LDLDIRECT in the last 72 hours. Thyroid Function Tests: No results for input(s): TSH, T4TOTAL, FREET4, T3FREE, THYROIDAB in the last 72 hours. Anemia Panel: No results for input(s): VITAMINB12, FOLATE, FERRITIN, TIBC, IRON, RETICCTPCT in the last 72 hours. Sepsis Labs: No results for input(s): PROCALCITON, LATICACIDVEN in the last 168 hours.  No results found for this or any previous visit (from the past 240 hour(s)).  Radiology Studies: No results found.      Scheduled Meds:  alteplase  2 mg Intracatheter Once   amiodarone  200 mg Oral Daily   apixaban  5 mg Oral Q12H   atorvastatin  40 mg Oral q1800   Chlorhexidine Gluconate Cloth  6 each Topical Daily   docusate sodium  100 mg Oral BID   feeding supplement  237 mL Oral TID BM   insulin aspart  0-15 Units Subcutaneous Q4H   midodrine  10 mg Oral  TID WC   multivitamin with minerals  1 tablet Oral Daily   pantoprazole  40 mg Oral Daily   zinc sulfate  220 mg Oral Daily   Continuous Infusions:   LOS: 43 days    Time spent: 39 minutes spent on chart review, discussion with nursing staff, consultants, updating family and interview/physical exam; more than 50% of that time was spent in counseling and/or coordination of care.    Bradyn Soward J [place] DO [facility] Available via Epic secure chat 7am-7pm After these hours, please refer to coverage provider listed on [email or web address] 05/21/2021, 1:13 PM

## 2021-05-21 NOTE — Consult Note (Signed)
   [facility] CM Inpatient Consult   05/21/2021  Ryan [name] 11/16/1945 [id number]  Follow up:  LLOS day 43  Primary Care Provider:  [name], MD   [place] [facility] [ACO] Patient: [facility]  Patient reviewed for progress and disposition needs for transition of care with length of stay review.  Chart review from inpatient [facility] team and PT/OT recommendations for transition is for a skilled nursing facility level of care due to 24 hour care needs.  Plan:  If patient transitions to a skilled facility then post hospital transition of care needs are to be met at a skilled nursing facility level of care. No current [facility] Care Management needs for community care management for transition assessed.  For questions, please contact:  Natividad Brood, RN BSN [facility] Liaison  [phone] business mobile phone Toll free office [phone]  Fax number: [phone] [email or web address]

## 2021-05-21 NOTE — TOC Progression Note (Signed)
Transition of Care [facility]) - Progression Note    Patient Details  Name: Ryan [name] MRN: [id number] Date of Birth: 1945-10-09  Transition of Care [facility]) CM/SW [place], LCSW Phone Number: 05/21/2021, 1:58 PM  Clinical Narrative:    [place] awaiting authorization.    Expected Discharge Plan: [place] Services Barriers to Discharge: Other (must enter comment), Continued Medical Work up (Awaiting Wound Vac approval and  homehealth pending)  Expected Discharge Plan and Services Expected Discharge Plan: [place] In-house Referral: [facility] Discharge Planning Services: CM Consult Post Acute Care Choice: [place] arrangements for the past 2 months: [place]                 DME Arranged: Negative pressure wound device, Vac ([place]) DME Agency: Other - Comment, [employer] Date DME Agency Contacted: 04/15/21 Time DME Agency Contacted: 1200 Representative spoke with at DME Agency: Olivia Mackie HH Arranged: RN, PT, OT [facility] Agency: [place] Date [place]: 04/15/21 Time [place]: 70 Representative spoke with at [place]: [place] ([place]) Interventions    Readmission Risk Interventions Readmission Risk Prevention Plan 04/15/2021  Home Care Screening Complete  Medication Review (RN CM) Complete  Some recent data might be hidden

## 2021-05-21 NOTE — Progress Notes (Signed)
Mobility Specialist: Progress Note   05/21/21 1404  Mobility  Activity Transferred:  Chair to bed  Level of Assistance +2 (takes two people)  Assistive Device Sliding board  Mobility Out of bed to chair with meals  Mobility Response Tolerated well  Mobility performed by Mobility specialist;Nurse tech  $Mobility charge 1 Mobility   Pt assisted back to bed per request using sliding board. Pt required frequent verbal cues for hand placement during transfer, asx throughout. Pt back in bed with call bell in his lap.   [facility] Cedar Roseman Mobility Specialist Mobility Specialist Phone: [phone]

## 2021-05-21 NOTE — Progress Notes (Signed)
Mobility Specialist: Progress Note   05/21/21 1219  Mobility  Activity Transferred:  Bed to chair  Level of Assistance +2 (takes two people)  Assistive Device Sliding board  Mobility Out of bed to chair with meals  Mobility Response Tolerated well  Mobility performed by Mobility specialist  Bed Position Chair  $Mobility charge 1 Mobility   Pre-Mobility: 75 HR, 130/71 BP, 97% SpO2 Post-Mobility: 84 HR, 134/72 BP, 96% SpO2  Pt required modA to sit EOB but was able to maintain balance sitting EOB. Pt required modA to scoot using sliding board. During transfer pt slid too far forward on sliding board with anterior lean d/t LLE extending out from under him requiring maxA to complete transfer to the chair. Pt is in the chair with call bell and phone in reach. NT present in the room.   [facility] Milyn Stapleton Mobility Specialist Mobility Specialist Phone: [phone]

## 2021-05-21 NOTE — TOC Progression Note (Signed)
Transition of Care [facility]) - Progression Note    Patient Details  Name: Ryan [name] MRN: [id number] Date of Birth: 03/30/1946  Transition of Care [facility]) CM/SW Contact  Vinie Sill, [place] Phone Number: 05/21/2021, 12:47 AM  Clinical Narrative:     [facility] is staring insurance authorization.   Thurmond Butts, MSW, LCSW Clinical Social Worker    Expected Discharge Plan: Home w Home Health Services Barriers to Discharge: Other (must enter comment), Continued Medical Work up (Awaiting Wound Vac approval and  homehealth pending)  Expected Discharge Plan and Services Expected Discharge Plan: [place] In-house Referral: [facility] Discharge Planning Services: CM Consult Post Acute Care Choice:  arrangements for the past 2 months: [place]                 DME Arranged: Negative pressure wound device, Vac ([place]) DME Agency: Other - Comment, [employer] Date DME Agency Contacted: 04/15/21 Time DME Agency Contacted: 1200 Representative spoke with at DME Agency: Olivia Mackie HH Arranged: RN, PT, OT [facility] Agency: [place] Date [place]: 04/15/21 Time [place]: 45 Representative spoke with at [place]: [place] ([place]) Interventions    Readmission Risk Interventions Readmission Risk Prevention Plan 04/15/2021  Home Care Screening Complete  Medication Review (RN CM) Complete  Some recent data might be hidden

## 2021-05-21 NOTE — Progress Notes (Signed)
Nutrition Follow Up  DOCUMENTATION CODES:   Not applicable  INTERVENTION:   Continue dysphagia 2 diet  Continue Ensure Enlive po TID, each supplement provides 350 kcal and 20 grams of protein Continue MVI with minerals daily   NUTRITION DIAGNOSIS:   Increased nutrient needs related to post-op healing as evidenced by estimated needs.  Ongoing  GOAL:   Patient will meet greater than or equal to 90% of their needs  Not meeting  MONITOR:   PO intake, Supplement acceptance, Diet advancement, Labs, Weight trends, Skin, I & O's  REASON FOR ASSESSMENT:   Consult Assessment of nutrition requirement/status, Wound healing  ASSESSMENT:   Ryan [name] is a 75 y.o. male with medical history significant of afib; MVR; chronic systolic CHF with AICD in place; DM; HTN; and PVD presenting for foot debridement/possible BKA.    He has had difficulty with a heel ulcer, not healing up well.  He was coming in today for surgery to have it cleaned up, possible amputation.  Unable to do the surgery because his INR was too high.  He takes Coumadin for afib.  Some days he feels ok and some he doesn't.  He has not feeling like he had a fever. Pt admitted rt foot wounds with critical limb ischemia.   7/21- s/p first/second R toe amputations, R heel excisional debridement, VAC placement 7/25- s/p LLE angiography 8/01- s/p nephrostomy tube, start iHD for AKI 8/03- underwent iHD 8/10- Cortrak placed, TF started  8/23- s/p R AKA 8/26- Cortrak pulled accidentally by patient   Patient working with mobility specialist upon RD visit. States his appetite has improved slightly but still remains decreased due to ongoing coughing. Last six meal completions charted as 75%, 40%, 60%, 75%, 0%, and 25%. Patient reports taking 1-2 Ensures daily.   Had discussion regarding Cortrak replacement with MD. MD would like to hold off for now to see if intake improves further.   Patient is awaiting insurance approval  for SNF placement.   UOP: 950 ml x 24 hrs   Medications: colace, SS novolog, 220 mg zinc sulfate  Labs: CBG 78-114  Diet Order:   Diet Order             DIET DYS 2 Room service appropriate? No; Fluid consistency: Thin  Diet effective now                   EDUCATION NEEDS:   Education needs have been addressed  Skin:  Skin Assessment: Skin Integrity Issues: Skin Integrity Issues:: Other (Comment) Other: L heel and L flank  Stage II: medial coccyx Incision: s/p R AKA  Last BM:  8/31  Height:   Ht Readings from Last 1 Encounters:  05/10/21 6' (1.829 m)    Weight:   Wt Readings from Last 1 Encounters:  05/20/21 78.2 kg    Ideal Body Weight:  80.9 kg  BMI:  Body mass index is 23.38 kg/m.  Estimated Nutritional Needs:   Kcal:  2200-2400  Protein:  105-120 grams  Fluid:  > 2 L  Jazalyn Mondor MS, RD, LDN, CNSC Clinical Nutrition Pager listed in [place]

## 2021-05-22 LAB — GLUCOSE, CAPILLARY
Glucose-Capillary: 102 mg/dL — ABNORMAL HIGH (ref 70–99)
Glucose-Capillary: 108 mg/dL — ABNORMAL HIGH (ref 70–99)
Glucose-Capillary: 77 mg/dL (ref 70–99)
Glucose-Capillary: 93 mg/dL (ref 70–99)
Glucose-Capillary: 96 mg/dL (ref 70–99)
Glucose-Capillary: 98 mg/dL (ref 70–99)

## 2021-05-22 MED ORDER — MIDODRINE HCL 10 MG PO TABS: 10.0000 mg | ORAL_TABLET | Freq: Three times a day (TID) | ORAL | Status: AC

## 2021-05-22 MED ORDER — APIXABAN 5 MG PO TABS: 5.0000 mg | ORAL_TABLET | Freq: Two times a day (BID) | ORAL | Status: AC

## 2021-05-22 MED ORDER — HYDROCODONE-ACETAMINOPHEN 7.5-325 MG PO TABS: 1.0000 | ORAL_TABLET | Freq: Four times a day (QID) | ORAL | 0 refills | Status: AC | PRN

## 2021-05-22 NOTE — Progress Notes (Signed)
This nurse instructed pt on procedure. HOB less than 45*. Pt held breath upon line removal. Pressure held to site for 5+min. No s/sx of bleeding. Instructed patient to monitor and report any bleeding, instructed to keep drsg CDI for 24 hr. And to remain in bed for 30 min. VU. Fran Lowes, RN VAST

## 2021-05-22 NOTE — Discharge Summary (Signed)
Physician Discharge Summary  Ryan [name] [id number] DOB: [birth year] DOA: 04/08/2021  PCP: Ryan Nakai, MD  Admit date: 04/08/2021 Discharge date: 05/22/2021  Admitted From: [facility] Disposition: [facility]  Recommendations for Outpatient Follow-up:  Follow up with PCP in 1-2 weeks Follow-up with vascular surgery, Dr. [name] 4 weeks Follow-up with urology, Dr. Jeffie Pollock 1 month for continued management of urostomy tubes Warfarin discontinued in favor of Eliquis   Discharge Condition: Stable CODE STATUS: Full code Diet recommendation: Heart healthy/consistent carbohydrate diet  History of present illness:  Ryan [name] is a 75 year old male with past medical history significant for atrial fibrillation, mitral valve replacement on Coumadin, CKD stage IIIb, type 2 diabetes mellitus, chronic systolic congestive heart failure s/p AICD, essential hypertension, peripheral vascular disease who presented to [facility] ED on 7/20 with concerns for foot infection per recommendations per vascular surgery clinic.  Vascular surgery was consulted and patient underwent partial right foot amputation with excisional debridement and wound VAC placement.  Unfortunately patient continued to show evidence of worsening limb ischemia and patient now underwent left AKA.    Hospital course:  Right lower extremity critical limb ischemia Osteomyelitis Patient presenting with wound to right toe, chronically has been seeing vascular surgery and podiatry outpatient.  Vascular surgery was consulted and followed during hospital course.  Patient underwent right first toe partial amputation, right second toe amputation and right heel excisional debridement on 04/09/2021 by Dr. Standley Dakins.  Angiogram on 04/13/2021 by Dr. Donzetta Matters with findings of left common femoral artery, SFA patent with heavy calcification, above the popliteal artery approximate 30% stenosis, below-knee runoff dominant via peroneal artery but  ends at the ankle, posterior tibial artery occludes mid calf, anterior tibial artery occludes after several centimeters and there is no indication for intervention of these vessels.  Fortunately patient continues to have a nonsalvageable right lower extremity and patient underwent right above-the-knee amputation by Dr.Dickson on 05/12/2021.  Continue atorvastatin 40 mg p.o. daily, Eliquis 5 mg p.o. twice daily.  Will need outpatient follow-up with vascular surgery in 4 weeks for staple removal.  Discharging to [place].   Acute respiratory failure with hypoxia: Resolved. Etiology likely mild type factorial given pulmonary embolism and bacterial pneumonia versus COVID-19 pneumonia.  Initially required high flow nasal cannula.  Completed antibiotic course and started on anticoagulation.  Oxygen now has been weaned off.   Acute pulmonary embolism Acute lower extremity DVT Vascular duplex ultrasound left lower extremity positive for acute thrombus left external iliac vein.  Nuclear medicine VQ scan 05/05/2021 with positive wedge-shaped perfusion defects right upper/right lower lobe consistent with pulmonary emboli. Eliquis 5 mg p.o. twice daily   Left hydronephrosis 2/2 large left retroperitoneal hematoma Urology, Dr. Jeffie Pollock was consulted and recommended percutaneous nephrostomy placement.  Interventional radiology was consulted underwent image guided left percutaneous drain placement on 04/20/2021 by Dr. Earleen Newport. Continue percutaneous nephrostomy tube for least 2 months, outpatient follow-up with urology.    Acute renal failure on CKD3b 2/2 ATN from hypotension with bleeding from hematoma Patient developed acute renal failure during hospitalization with creatinine peaking at 5.07 with a baseline creatinine of 1.5 -2.0.  CT abdomen/pelvis 7/30 with 17 x 17 x 7 cm left retroperitoneal fluid collection likely secondary to large hematoma with mass-effect on the left ureter with moderate left  hydronephrosis.  Etiology likely secondary to hypotension from retroperitoneal hematoma with associated left hydronephrosis.  Patient underwent percutaneous nephrostomy as above.  Nephrology was also consulted and patient initially underwent intermittent  HD which has now been discontinued.  Creatinine 1.23 at time of discharge.   Chronic systolic congestive heart failure, compensated Essential hypertension On Lasix 20 mg p.o. daily at home.  TTE 8/16 with LVEF 25-30% with LV severely decreased function, LA moderately dilated, RA mildly dilated, TR moderate, IVC normal in size.  Home furosemide was discontinued.  Continue to monitor daily weights.  Outpatient follow-up with cardiology.   Permanent atrial fibrillation History of bioprosthetic mitral valve.  Amiodarone 200 mg p.o. daily and Eliquis 5 mg p.o. twice daily   Type 2 diabetes mellitus Hemoglobin A1c 5.4, well controlled.  On Jardiance 10 mg p.o. daily at home.   Acute thrombocytopenia During hospitalization, platelet count decreased after he was started on IV heparin.  IV heparin was changed to a garter band.  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  Platelet count 79 on discharge, stable.   Dysphagia secondary to thrush Completed 10-day course of nystatin.  Currently tolerating diet.  Continue aspiration precautions on discharge.   Covid-19 viral pneumonia Completed course of remdesivir and steroids.   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  Outpatient follow-up with cardiology.   Poor oral intake Core track was initially placed temporary due to poor oral intake.  He now has been discontinued from core track after pulling it out.  His eating habits continue to improve.  Seen by speech therapy on 8/30, Continue dysphagia 2 diet   Stage II sacral decubitus pressure ulcer, POA Pressure Injury 04/21/21  Coccyx Medial Stage 2 -  Partial thickness loss of dermis presenting as a shallow open injury with a red, pink wound bed without slough. (Active)  04/21/21 1255  Location: Coccyx  Location Orientation: Medial  Staging: Stage 2 -  Partial thickness loss of dermis presenting as a shallow open injury with a red, pink wound bed without slough.  Wound Description (Comments):   Present on Admission: No  --Continue local wound care, offloading  Discharge Diagnoses:  Active Problems:   Chronic systolic heart failure (HCC)   Status post mitral valve replacement with bioprosthetic valve   Diabetes mellitus ([place])   Essential hypertension   ICD (implantable cardioverter-defibrillator) in place   Stage 3 chronic kidney disease (HCC)   Pressure injury of skin    Discharge Instructions  Discharge Instructions     Diet - low sodium heart healthy   Complete by: As directed    Discharge wound care:   Complete by: As directed    Wound care to coccyx Stage 2:  Cleanse with NS, pat gently dry. Cover with size appropriate piece of xeroform gauze, top with dry gauze 2x2 and cover with silicone foam dressing for sacrum with "tip" oriented away from anus. Turn patient side to side.   Increase activity slowly   Complete by: As directed       Allergies as of 05/22/2021       Reactions   Tuna [fish Allergy] Nausea And Vomiting        Medication List     STOP taking these medications    furosemide 20 MG tablet Commonly known as: Lasix   HYDROcodone-acetaminophen 5-325 MG tablet Commonly known as: NORCO/VICODIN Replaced by: HYDROcodone-acetaminophen 7.5-325 MG tablet   mupirocin ointment 2 % Commonly known as: BACTROBAN   potassium chloride 10 MEQ tablet Commonly known as: KLOR-CON   warfarin 7.5 MG tablet Commonly known  as: COUMADIN       TAKE these medications    acetaminophen 325 MG tablet Commonly known as: TYLENOL Take 650 mg by mouth every 6 (six) hours as needed for mild  pain or headache.   amiodarone 200 MG tablet Commonly known as: PACERONE TAKE 1 TABLET BY MOUTH EVERY DAY   apixaban 5 MG Tabs tablet Commonly known as: ELIQUIS Take 1 tablet (5 mg total) by mouth every 12 (twelve) hours.   atorvastatin 40 MG tablet Commonly known as: LIPITOR Take 1 tablet (40 mg total) by mouth daily at 6 PM.   bisacodyl 5 MG EC tablet Commonly known as: DULCOLAX Take 5 mg by mouth daily as needed for moderate constipation.   CENTRUM SILVER PO Take 1 tablet by mouth 3 (three) times a week.   docusate sodium 100 MG capsule Commonly known as: COLACE Take 100 mg by mouth daily.   empagliflozin 10 MG Tabs tablet Commonly known as: JARDIANCE Take 1 tablet (10 mg total) by mouth daily.   feeding supplement Liqd Take 237 mLs by mouth 2 (two) times daily between meals.   gabapentin 300 MG capsule Commonly known as: NEURONTIN Take 300 mg by mouth at bedtime.   guaiFENesin 100 MG/5ML Soln Commonly known as: ROBITUSSIN Take 5 mLs by mouth every 4 (four) hours as needed for cough or to loosen phlegm.   HYDROcodone-acetaminophen 7.5-325 MG tablet Commonly known as: NORCO Take 1 tablet by mouth every 6 (six) hours as needed for up to 7 days for moderate pain. Replaces: HYDROcodone-acetaminophen 5-325 MG tablet   midodrine 10 MG tablet Commonly known as: PROAMATINE Take 1 tablet (10 mg total) by mouth 3 (three) times daily with meals.   pantoprazole 40 MG tablet Commonly known as: PROTONIX Take 40 mg by mouth daily.   triamcinolone cream 0.5 % Commonly known as: KENALOG Apply 1 application topically daily as needed (skin rash).               Discharge Care Instructions  (From admission, onward)           Start     Ordered   05/22/21 0000  Discharge wound care:       Comments: Wound care to coccyx Stage 2:  Cleanse with NS, pat gently dry. Cover with size appropriate piece of xeroform gauze, top with dry gauze 2x2 and cover with silicone  foam dressing for sacrum with "tip" oriented away from anus. Turn patient side to side.   05/22/21 1411            Contact information for follow-up providers     [facility], [facility] Follow up.   Specialty: Home Health Services Why: HHRN/PT/OT arranged- (RN will come do home wound VAC drsg changes)- home wound VAC arranged with [facility]-- they will contact you to schedule home visits (first visit Friday 7/29) Contact information: [address] [place] [address] [phone]         Cherre Robins, MD Follow up in 4 week(s).   Specialties: Vascular Surgery, Interventional Cardiology Why: Office will call you to arrange your appt (sent) Contact information: [place] [address] [phone]         Irine Seal, MD. Schedule an appointment as soon as possible for a visit in 1 month(s).   Specialty: Urology Why: F/U regarding percutaneous nephrostomy Contact information: [place] [address] [phone]  Contact information for after-discharge care     Destination     [facility] Preferred SNF .   Service: Skilled Nursing Contact information: [address] [place] [phone]                    Allergies  Allergen Reactions   [name] Allergy] Nausea And Vomiting    Consultations: Vascular surgery Palliative care Medical oncology/hematology, Dr. Payton Mccallum Nephrology, Dr. Posey Pronto, Dr. Osborne Casco Urology, Dr. Jeffie Pollock Interventional radiology   Procedures/Studies: CT ABDOMEN PELVIS WO CONTRAST  Result Date: 04/24/2021 CLINICAL DATA:  Abdominal distension, left retroperitoneal hematoma, status post left percutaneous nephrostomy EXAM: CT ABDOMEN AND PELVIS WITHOUT CONTRAST TECHNIQUE: Multidetector CT imaging of the abdomen and pelvis was performed following the standard protocol without IV contrast. COMPARISON:  04/19/2021 FINDINGS: Lower chest: Small  to moderate bilateral pleural effusions and associated atelectasis or consolidation, increased compared to prior examination. Mitral valve prosthesis. Cardiomegaly. Hepatobiliary: No solid liver abnormality is seen. Sludge and gallstones in the dependent gallbladder. No gallbladder wall thickening, or biliary dilatation. Pancreas: Unremarkable. No pancreatic ductal dilatation or surrounding inflammatory changes. Spleen: Normal in size without significant abnormality. Adrenals/Urinary Tract: Adrenal glands are unremarkable. Status post interval left percutaneous nephrostomy with formed pigtail in the left renal pelvis and relief of previously seen hydronephrosis. No right-sided hydronephrosis. Bladder is unremarkable. Stomach/Bowel: Stomach is within normal limits. Appendix appears normal. No evidence of bowel wall thickening, distention, or inflammatory changes. Vascular/Lymphatic: Aortic atherosclerosis. No enlarged abdominal or pelvic lymph nodes. Reproductive: No mass or other significant abnormality. Other: Anasarca, worsened compared to prior examination. No abdominopelvic ascites. No significant interval change in a large, heterogeneously layering hematoma centered about the left aspect of the retroperitoneum and mesocolon, measuring 16.7 x 9.2 cm in greatest axial extent (series 3, image 56). Additional sizable hematoma component in the left iliacus muscle measuring approximately 5.2 x 2.5 cm, likewise not significantly changed (series 3, image 70). Musculoskeletal: No acute or significant osseous findings. IMPRESSION: 1. No significant interval change in a large, heterogeneously layering hematoma centered about the left aspect of the retroperitoneum and mesocolon, measuring 16.7 x 9.2 cm in greatest axial extent. 2. Additional sizable hematoma component in the left iliacus muscle measuring approximately 5.2 x 2.5 cm, likewise not significantly changed. 3. Status post interval left percutaneous nephrostomy  with formed pigtail in the left renal pelvis and relief of previously seen hydronephrosis. No right-sided hydronephrosis. 4. Small to moderate bilateral pleural effusions and associated atelectasis or consolidation, increased compared to prior examination. 5. Anasarca, worsened compared to prior examination. 6. Cholelithiasis. Aortic Atherosclerosis (ICD10-I70.0). Electronically Signed   By: Eddie Candle M.D.   On: 04/24/2021 15:28   NM Pulmonary Perfusion  Result Date: 05/05/2021 CLINICAL DATA:  PE suspected, high prob EXAM: NUCLEAR MEDICINE PERFUSION LUNG SCAN TECHNIQUE: Perfusion images were obtained in multiple projections after intravenous injection of radiopharmaceutical. Ventilation scans intentionally deferred if perfusion scan and chest x-ray adequate for interpretation during COVID 19 epidemic. RADIOPHARMACEUTICALS:  4.3 mCi Tc-47mMAA IV COMPARISON:  Chest radiograph earlier today. FINDINGS: There is a wedge-shaped perfusion defect in the right upper lobe, with additional wedge-shaped perfusion defect in the right lower lobe. Left lung perfusion is minimally heterogeneous but no wedge-shaped defects are seen. Photopenia in the left upper lung zone is related to pacemaker. Cardiomegaly is seen. IMPRESSION: Positive for wedge-shaped perfusion defects in the right upper and right lower lobe consistent with pulmonary emboli. Examination is positive for pulmonary embolus.  Electronically Signed   By: Keith Rake M.D.   On: 05/05/2021 15:39   VAS [place] IVC/ILIAC (VENOUS ONLY)  Result Date: 05/04/2021 IVC/ILIAC STUDY Patient Name:  COURAGE RUBY  Date of Exam:   05/04/2021 Medical Rec #: [id number]         Accession #:    [id number] Date of Birth: 1946-01-19         Patient Gender: M Patient Age:   19 years Exam Location:  [facility] Procedure:      VAS [place] IVC/ILIAC (VENOUS ONLY) Referring Phys: --------------------------------------------------------------------------------  Indications: DVT  seen on lower extremity venous duplex Other Factors: COVID. Limitations: Air/bowel gas, obesity and hematoma at midline/along left iliacus muscle, seen on CT.  Comparison Study: No prior study Performing Technologist: Maudry Mayhew MHA, RDMS, RVT, RDCS  Examination Guidelines: A complete evaluation includes B-mode imaging, spectral Doppler, color Doppler, and power Doppler as needed of all accessible portions of each vessel. Bilateral testing is considered an integral part of a complete examination. Limited examinations for reoccurring indications may be performed as noted.  +----------------+---------+-----------+---------+-----------+----------------+       CIV       RT-PatentRT-ThrombusLT-PatentLT-Thrombus    Comments     +----------------+---------+-----------+---------+-----------+----------------+ Common Iliac                         patent                Unable to     Prox                                                    visualize right                                                            proximal CIV   +----------------+---------+-----------+---------+-----------+----------------+ Common Iliac     patent              patent                              Distal                                                                   +----------------+---------+-----------+---------+-----------+----------------+  +-------------------------+---------+-----------+---------+-----------+--------+            EIV           RT-PatentRT-ThrombusLT-PatentLT-ThrombusComments +-------------------------+---------+-----------+---------+-----------+--------+ External Iliac Vein Prox  patent              patent                      +-------------------------+---------+-----------+---------+-----------+--------+ External Iliac Vein Mid                       patent                       +-------------------------+---------+-----------+---------+-----------+--------+  External Iliac Vein       patent                         acute            Distal                                                                    +-------------------------+---------+-----------+---------+-----------+--------+   Summary: IVC/Iliac: There is evidence of acute thrombus involving the left external iliac vein.  *See table(s) above for measurements and observations.  Electronically signed by Monica Martinez MD on 05/04/2021 at 4:13:03 PM.    Final    DG CHEST PORT 1 VIEW  Result Date: 05/11/2021 CLINICAL DATA:  Cough, COVID-19 positive. EXAM: PORTABLE CHEST 1 VIEW COMPARISON:  May 08, 2021. FINDINGS: Stable cardiomegaly. Feeding tube is seen entering stomach. Left-sided pacemaker is unchanged in position. Status post cardiac valve repair. Right-sided PICC line is unchanged. No pneumothorax is noted. Stable bilateral lung opacities are noted concerning for edema or pneumonia with small bilateral pleural effusions. Bony thorax is unremarkable. IMPRESSION: Stable support apparatus. Stable bilateral lung opacities are noted. Aortic Atherosclerosis (ICD10-I70.0). Electronically Signed   By: Marijo Conception M.D.   On: 05/11/2021 11:57   DG CHEST PORT 1 VIEW  Result Date: 05/08/2021 CLINICAL DATA:  COVID positive.  History of respiratory failure EXAM: PORTABLE CHEST 1 VIEW COMPARISON:  05/04/2021 FINDINGS: NG 2 and PICC line unchanged. Feeding tube and PICC line unchanged. Pacer alive stable cardiac silhouette. There is again demonstrated LEFT basilar atelectasis. Bilateral small effusions IMPRESSION: 1. No unchanged. 2. Stable support apparatus. 3. Bibasilar atelectasis and small effusions. Electronically Signed   By: Suzy Bouchard M.D.   On: 05/08/2021 14:15   DG Chest Port 1 View  Result Date: 05/04/2021 CLINICAL DATA:  Right PICC placement EXAM: PORTABLE CHEST 1 VIEW COMPARISON:  Chest  radiograph from earlier today. FINDINGS: Right PICC terminates at the cavoatrial junction. Enteric tube enters stomach with the tip not seen on this image. Stable configuration of intact sternotomy wires, cardiac valvular prosthesis and 2 lead left subclavian ICD. Stable cardiomediastinal silhouette with mild cardiomegaly. No pneumothorax. Small bilateral pleural effusions are stable. Patchy hazy parahilar interstitial opacities, improved. Stable left basilar atelectasis. IMPRESSION: 1. Right PICC terminates at the cavoatrial junction. 2. Improved patchy hazy parahilar interstitial opacities, compatible with improving cardiogenic pulmonary edema. 3. Stable small bilateral pleural effusions and left basilar atelectasis. Electronically Signed   By: Ilona Sorrel M.D.   On: 05/04/2021 19:10   DG CHEST PORT 1 VIEW  Result Date: 05/04/2021 CLINICAL DATA:  Hypoxia, shortness of breath. EXAM: PORTABLE CHEST 1 VIEW COMPARISON:  05/02/2021. FINDINGS: Feeding tube is followed into the stomach with the tip projecting beyond the inferior margin of the image. Pacemaker and ICD lead tips are in the right atrium and right ventricle, respectively. Heart is enlarged, stable. Thoracic aorta is calcified. Mixed interstitial and airspace opacification persists, right greater than left, similar to 05/02/2021. Probable bilateral pleural effusions. IMPRESSION: Congestive heart failure and/or pneumonia. Electronically Signed   By: Lorin Picket M.D.   On: 05/04/2021 09:09   DG CHEST PORT 1 VIEW  Result Date: 05/02/2021 CLINICAL DATA:  Hypoxia EXAM:  PORTABLE CHEST 1 VIEW COMPARISON:  04/26/2021 FINDINGS: Cardiac shadow is enlarged but stable. Defibrillator and postsurgical changes are seen. Feeding catheter is noted extending into the stomach. Lungs are well aerated with diffuse airspace opacity throughout the right lung as well as the left lung base increased in the interval from the prior exam. IMPRESSION: Developing pneumonic  infiltrates bilaterally right greater than left as described. Electronically Signed   By: Inez Catalina M.D.   On: 05/02/2021 23:38   DG CHEST PORT 1 VIEW  Result Date: 04/26/2021 CLINICAL DATA:  Hypoxia EXAM: PORTABLE CHEST 1 VIEW COMPARISON:  04/25/2021 FINDINGS: Cardiac shadow is enlarged but stable. Postsurgical changes are noted. Defibrillator is again seen and stable. The lungs are well aerated bilaterally. Mild left basilar opacity is again identified and stable. Patchy right basilar opacity is noted new from the prior exam. No bony abnormality is seen. IMPRESSION: Stable left basilar opacity. New patchy right basilar opacity. Electronically Signed   By: Inez Catalina M.D.   On: 04/26/2021 23:32   DG Chest Port 1 View  Result Date: 04/25/2021 CLINICAL DATA:  Dyspnea EXAM: PORTABLE CHEST 1 VIEW COMPARISON:  Chest radiograph dated 04/19/2021. FINDINGS: The heart remains enlarged. Vascular calcifications are seen in the aortic arch. There is a small left pleural effusion which appears increased. Mild bibasilar atelectasis/airspace disease, left greater than right, appear increased. No pneumothorax. A left subclavian approach cardiac device, median sternotomy wires, and a mitral valve replacement are redemonstrated. IMPRESSION: Increased small left pleural effusion and bibasilar atelectasis/airspace disease. Aortic Atherosclerosis (ICD10-I70.0). Electronically Signed   By: Zerita Boers M.D.   On: 04/25/2021 10:27   DG Abd Portable 1V  Result Date: 04/29/2021 CLINICAL DATA:  Feeding tube EXAM: PORTABLE ABDOMEN - 1 VIEW COMPARISON:  09/18/2012, CT 04/24/2021 FINDINGS: Esophageal tube tip overlies the gastroduodenal region. Left-sided percutaneous nephrostomy tube. Mild diffuse increased bowel gas without obstructive change. Airspace disease at left base. IMPRESSION: Esophageal tube tip overlies the distal stomach/gastroduodenal region Electronically Signed   By: Donavan Foil M.D.   On: 04/29/2021 19:20    ECHOCARDIOGRAM COMPLETE  Result Date: 05/05/2021    ECHOCARDIOGRAM REPORT   Patient Name:   KEIRON IACOBELLI Date of Exam: 05/05/2021 Medical Rec #:  [id number]        Height:       72.0 in Accession #:    [id number]       Weight:       194.0 lb Date of Birth:  1945/12/16        BSA:          2.103 m Patient Age:    24 years         BP:           94/65 mmHg Patient Gender: M                HR:           70 bpm. Exam Location:  Inpatient Procedure: 2D Echo, Color Doppler, Cardiac Doppler and Intracardiac            Opacification Agent Indications:    Positive D dimer [id number]  History:        Patient has prior history of Echocardiogram examinations, most                 recent 03/04/2021. Defibrillator, PAD, Arrythmias:PVC,                 Supraventricular tachycardia and Atrial Fibrillation; Risk  Factors:Hypertension and Diabetes. COVID. Dilated                 cardiomyopathy.                  Mitral Valve: 27 mm [facility] bioprosthetic valve valve is                 present in the mitral position. Procedure Date: 09/26/2012.  Sonographer:    Darlina Sicilian RDCS Referring Phys: [id number] [place]  1. Left ventricular ejection fraction, by estimation, is 25 to 30%. The left ventricle has severely decreased function. The left ventricle demonstrates global hypokinesis. The left ventricular internal cavity size was mildly dilated. Left ventricular diastolic parameters are indeterminate.  2. Device leads in RV/RA. Right ventricular systolic function is moderately reduced. The right ventricular size is moderately enlarged. There is moderately elevated pulmonary artery systolic pressure.  3. Left atrial size was moderately dilated.  4. Right atrial size was mildly dilated.  5. 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 The mitral valve has been repaired/replaced. No evidence of mitral valve regurgitation. No evidence of mitral stenosis. There is a 27 mm [facility] bioprosthetic  valve present in the mitral position. Procedure Date: 09/26/2012.  6. Tricuspid valve regurgitation is moderate.  7. The aortic valve is calcified. There is moderate calcification of the aortic valve. Aortic valve regurgitation is not visualized. Mild to moderate aortic valve sclerosis/calcification is present, without any evidence of aortic stenosis.  8. Aortic dilatation noted. There is mild dilatation of the aortic root, measuring 41 mm.  9. The inferior vena cava is normal in size with greater than 50% respiratory variability, suggesting right atrial pressure of 3 mmHg. FINDINGS  Left Ventricle: Left ventricular ejection fraction, by estimation, is 25 to 30%. The left ventricle has severely decreased function. The left ventricle demonstrates global hypokinesis. Definity contrast agent was given IV to delineate the left ventricular endocardial borders. The left ventricular internal cavity size was mildly dilated. There is no left ventricular hypertrophy. Left ventricular diastolic parameters are indeterminate. Right Ventricle: Device leads in RV/RA. The right ventricular size is moderately enlarged. No increase in right ventricular wall thickness. Right ventricular systolic function is moderately reduced. There is moderately elevated pulmonary artery systolic pressure. The tricuspid regurgitant velocity is 3.28 m/s, and with an assumed right atrial pressure of 15 mmHg, the estimated right ventricular systolic pressure is 123XX123 mmHg. Left Atrium: Left atrial size was moderately dilated. Right Atrium: Right atrial size was mildly dilated. Pericardium: There is no evidence of pericardial effusion. Mitral Valve: Normal functioning bioprosthetic MVR no PVL. The mitral valve has been repaired/replaced. No evidence of mitral valve regurgitation. There is a 27 mm [facility] bioprosthetic valve present in the mitral position. Procedure Date: 09/26/2012. No evidence of mitral valve stenosis. MV peak gradient, 11.1 mmHg.  The mean mitral valve gradient is 6.5 mmHg. Tricuspid Valve: The tricuspid valve is normal in structure. Tricuspid valve regurgitation is moderate . No evidence of tricuspid stenosis. Aortic Valve: The aortic valve is calcified. There is moderate calcification of the aortic valve. Aortic valve regurgitation is not visualized. Mild to moderate aortic valve sclerosis/calcification is present, without any evidence of aortic stenosis. Pulmonic Valve: The pulmonic valve was normal in structure. Pulmonic valve regurgitation is mild. No evidence of pulmonic stenosis. Aorta: The aortic root is normal in size and structure and aortic dilatation noted. There is mild dilatation of the aortic root, measuring 41 mm. Venous: The  inferior vena cava is normal in size with greater than 50% respiratory variability, suggesting right atrial pressure of 3 mmHg. IAS/Shunts: No atrial level shunt detected by color flow Doppler. Additional Comments: A device lead is visualized.  LEFT VENTRICLE PLAX 2D LVIDd:         5.00 cm      Diastology LVIDs:         4.10 cm      LV e' medial:    5.30 cm/s LV PW:         0.80 cm      LV E/e' medial:  24.9 LV IVS:        1.00 cm      LV e' lateral:   7.23 cm/s LVOT diam:     2.20 cm      LV E/e' lateral: 18.3 LV SV:         57 LV SV Index:   27 LVOT Area:     3.80 cm  LV Volumes (MOD) LV vol d, MOD A2C: 111.5 ml LV vol d, MOD A4C: 92.9 ml LV vol s, MOD A2C: 87.5 ml LV vol s, MOD A4C: 48.2 ml LV SV MOD A2C:     24.0 ml LV SV MOD A4C:     92.9 ml LV SV MOD BP:      35.8 ml RIGHT VENTRICLE RV S prime:     9.66 cm/s TAPSE (M-mode): 1.8 cm LEFT ATRIUM             Index       RIGHT ATRIUM           Index LA diam:        4.50 cm 2.14 cm/m  RA Area:     24.70 cm LA Vol (A2C):   41.1 ml 19.54 ml/m RA Volume:   73.50 ml  34.95 ml/m LA Vol (A4C):   36.0 ml 17.12 ml/m LA Biplane Vol: 40.3 ml 19.16 ml/m  AORTIC VALVE             PULMONIC VALVE LVOT Vmax:   76.50 cm/s  PR End Diast Vel: 12.39 msec LVOT Vmean:   45.500 cm/s LVOT VTI:    0.150 m  AORTA Ao Root diam: 4.10 cm Ao Asc diam:  3.85 cm MITRAL VALVE                TRICUSPID VALVE MV Area (PHT): 3.17 cm     TR Peak grad:   43.0 mmHg MV Area VTI:   1.15 cm     TR Vmax:        328.00 cm/s MV Peak grad:  11.1 mmHg MV Mean grad:  6.5 mmHg     SHUNTS MV Vmax:       1.66 m/s     Systemic VTI:  0.15 m MV Vmean:      121.5 cm/s   Systemic Diam: 2.20 cm MV Decel Time: 239 msec MV E velocity: 132.00 cm/s MV A velocity: 161.00 cm/s MV E/A ratio:  0.82 Jenkins Rouge MD Electronically signed by Jenkins Rouge MD Signature Date/Time: 05/05/2021/11:57:31 AM    Final    VAS [place] LOWER EXTREMITY VENOUS (DVT)  Result Date: 05/04/2021  Lower Venous DVT Study Patient Name:  ENDERSON RIOPELLE  Date of Exam:   05/04/2021 Medical Rec #: [id number]         Accession #:    [id number] Date of Birth: [birth year]  Patient Gender: M Patient Age:   57 years Exam Location:  [facility] Procedure:      VAS [place] LOWER EXTREMITY VENOUS (DVT) Referring Phys: KELLY GRIFFITH --------------------------------------------------------------------------------  Indications: Edema, and COVID.  Comparison Study: 04/22/21- negative left lower extremity venous duplex Performing Technologist: Maudry Mayhew MHA, RDMS, RVT, RDCS  Examination Guidelines: A complete evaluation includes B-mode imaging, spectral Doppler, color Doppler, and power Doppler as needed of all accessible portions of each vessel. Bilateral testing is considered an integral part of a complete examination. Limited examinations for reoccurring indications may be performed as noted. The reflux portion of the exam is performed with the patient in reverse Trendelenburg.  +---------+---------------+---------+-----------+----------+--------------+ RIGHT    CompressibilityPhasicitySpontaneityPropertiesThrombus Aging +---------+---------------+---------+-----------+----------+--------------+ CFV      Full           Yes      Yes                                  +---------+---------------+---------+-----------+----------+--------------+ SFJ      Full                                                        +---------+---------------+---------+-----------+----------+--------------+ FV Prox  Full                                                        +---------+---------------+---------+-----------+----------+--------------+ FV Mid   Full                                                        +---------+---------------+---------+-----------+----------+--------------+ FV DistalFull                                                        +---------+---------------+---------+-----------+----------+--------------+ PFV      Full                                                        +---------+---------------+---------+-----------+----------+--------------+ POP      Full           Yes      Yes                                 +---------+---------------+---------+-----------+----------+--------------+ PTV      None                    Yes                  Acute          +---------+---------------+---------+-----------+----------+--------------+ PERO  None                    No                   Acute          +---------+---------------+---------+-----------+----------+--------------+   +---------+---------------+---------+-----------+----------+--------------+ LEFT     CompressibilityPhasicitySpontaneityPropertiesThrombus Aging +---------+---------------+---------+-----------+----------+--------------+ CFV      None           Yes      Yes                  Acute          +---------+---------------+---------+-----------+----------+--------------+ SFJ      None                                         Acute          +---------+---------------+---------+-----------+----------+--------------+ FV Prox  None                                         Acute           +---------+---------------+---------+-----------+----------+--------------+ FV Mid   None                                         Acute          +---------+---------------+---------+-----------+----------+--------------+ FV DistalNone                    Yes                  Acute          +---------+---------------+---------+-----------+----------+--------------+ PFV      None                                         Acute          +---------+---------------+---------+-----------+----------+--------------+ POP      None           Yes      Yes                  Acute          +---------+---------------+---------+-----------+----------+--------------+ PTV      Full                    Yes        patent                   +---------+---------------+---------+-----------+----------+--------------+ PERO     None                    No                   Acute          +---------+---------------+---------+-----------+----------+--------------+     Summary: RIGHT: - Findings consistent with acute deep vein thrombosis involving the right posterior tibial veins, and right peroneal veins.  LEFT: - Findings consistent with acute deep vein thrombosis involving the left common femoral vein, SF junction, left femoral vein, left proximal profunda  vein, left popliteal vein, and left peroneal veins. - No cystic structure found in the popliteal fossa.  *See table(s) above for measurements and observations. Electronically signed by Monica Martinez MD on 05/04/2021 at 4:15:55 PM.    Final    VAS [place] UPPER EXTREMITY VENOUS DUPLEX  Result Date: 05/04/2021 UPPER VENOUS STUDY  Patient Name:  KALO DAHLKE  Date of Exam:   05/04/2021 Medical Rec #: [id number]         Accession #:    [id number] Date of Birth: 1946-07-13         Patient Gender: M Patient Age:   5 years Exam Location:  [facility] Procedure:      VAS [place] UPPER EXTREMITY VENOUS DUPLEX Referring Phys: Claiborne Billings GRIFFITH  --------------------------------------------------------------------------------  Indications: Edema Other Indications: COVID. Limitations: Poor ultrasound/tissue interface. Comparison Study: No prior study Performing Technologist: Maudry Mayhew MHA, RDMS, RVT, RDCS  Examination Guidelines: A complete evaluation includes B-mode imaging, spectral Doppler, color Doppler, and power Doppler as needed of all accessible portions of each vessel. Bilateral testing is considered an integral part of a complete examination. Limited examinations for reoccurring indications may be performed as noted.  Right Findings: +----------+------------+---------+-----------+----------+-------+ RIGHT     CompressiblePhasicitySpontaneousPropertiesSummary +----------+------------+---------+-----------+----------+-------+ Subclavian               Yes       Yes                      +----------+------------+---------+-----------+----------+-------+  Left Findings: +----------+------------+---------+-----------+----------+-------+ LEFT      CompressiblePhasicitySpontaneousPropertiesSummary +----------+------------+---------+-----------+----------+-------+ IJV           Full       Yes       Yes                      +----------+------------+---------+-----------+----------+-------+ Subclavian    None                 No                Acute  +----------+------------+---------+-----------+----------+-------+ Axillary      None                 No                Acute  +----------+------------+---------+-----------+----------+-------+ Brachial      None                 No                Acute  +----------+------------+---------+-----------+----------+-------+ Radial        None                 Yes               Acute  +----------+------------+---------+-----------+----------+-------+ Ulnar         None                 No                Acute   +----------+------------+---------+-----------+----------+-------+ Cephalic      Full                                          +----------+------------+---------+-----------+----------+-------+ Basilic       None  Acute  +----------+------------+---------+-----------+----------+-------+  Summary:  Right: No evidence of thrombosis in the subclavian.  Left: Findings consistent with acute deep vein thrombosis involving the left subclavian vein, left axillary vein, left brachial veins, left radial veins and left ulnar veins. Findings consistent with acute superficial vein thrombosis involving the left basilic vein.  *See table(s) above for measurements and observations.  Diagnosing physician: Monica Martinez MD Electronically signed by Monica Martinez MD on 05/04/2021 at 4:12:40 PM.    Final    [place] EKG SITE RITE  Result Date: 05/04/2021 If Site Rite image not attached, placement could not be confirmed due to current cardiac rhythm.    Subjective: Patient seen examined at bedside, resting comfortably.  Eating breakfast.  No specific complaints this morning.  Discharging to SNF today.  Denies headache, no chest pain, no shortness of breath, no abdominal pain.  No acute events overnight per nursing staff.  Discharge Exam: Vitals:   05/22/21 1236 05/22/21 1348  BP: 128/72   Pulse: 66 67  Resp: 18 18  Temp: 97.8 F (36.6 C)   SpO2: 98% 96%   Vitals:   05/22/21 0851 05/22/21 0951 05/22/21 1236 05/22/21 1348  BP: 129/66  128/72   Pulse: 71 74 66 67  Resp: '18  18 18  '$ Temp: 98.4 F (36.9 C)  97.8 F (36.6 C)   TempSrc: Oral  Oral   SpO2: 93% 95% 98% 96%  Weight:      Height:        General: Pt is alert, awake, not in acute distress, chronically ill in appearance Cardiovascular: RRR, S1/S2 +, no rubs, no gallops Respiratory: CTA bilaterally, no wheezing, no rhonchi, on room air Abdominal: Soft, NT, ND, bowel sounds + Extremities: 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  No edema, no cyanosis    The results of significant diagnostics from this hospitalization (including imaging, microbiology, ancillary and laboratory) are listed below for reference.     Microbiology: Recent Results (from the past 240 hour(s))  SARS CORONAVIRUS 2 (TAT 6-24 HRS) Nasopharyngeal Nasopharyngeal Swab     Status: Abnormal   Collection Time: 05/21/21  1:13 PM   Specimen: Nasopharyngeal Swab  Result Value Ref Range Status   SARS Coronavirus 2 POSITIVE (A) NEGATIVE Final    Comment: (NOTE) SARS-CoV-2 target nucleic acids are DETECTED.  The SARS-CoV-2 RNA is generally detectable in upper and lower respiratory specimens during the acute phase of infection. Positive results are indicative of the presence of SARS-CoV-2 RNA. Clinical correlation with patient history and other diagnostic information is  necessary to determine patient infection status. Positive results do not rule out bacterial infection or co-infection with other viruses.  The expected result is Negative.  Fact Sheet for Patients: SugarRoll.be  Fact Sheet for Healthcare Providers: https://www.woods-mathews.com/  This test is not yet approved or cleared by the [place] FDA and  has been authorized for detection and/or diagnosis of SARS-CoV-2 by FDA under an Emergency Use Authorization (EUA). This EUA will remain  in effect (meaning this test can be used) for the duration of the COVID-19 declaration under Section 564(b)(1) of the Act, 21 U. S.C. section 360bbb-3(b)(1), unless the authorization is terminated or revoked sooner.   Performed at [facility], [place] [address], [place], [place] [address]      Labs: BNP (last 3 results) Recent Labs    04/28/21 0116 04/29/21 0826 05/02/21 2338  BNP 1,706.1* 2,208.9* 0000000*   Basic Metabolic Panel: No results for  input(s): NA, K, CL, CO2, GLUCOSE, BUN,  CREATININE, CALCIUM, MG, PHOS in the last 168 hours. Liver Function Tests: Recent Labs  Lab 05/17/21 0600  AST 36  ALT 28  ALKPHOS 77  BILITOT 1.2  PROT 5.7*  ALBUMIN 2.1*   No results for input(s): LIPASE, AMYLASE in the last 168 hours. No results for input(s): AMMONIA in the last 168 hours. CBC: Recent Labs  Lab 05/17/21 0600 05/18/21 0233 05/19/21 0500 05/20/21 0500 05/21/21 0140  WBC 7.4 8.5 8.4 8.5 10.6*  HGB 7.6* 8.4* 8.5* 8.4* 8.6*  HCT 25.7* 27.0* 27.4* 28.3* 28.3*  MCV 82.1 79.6* 79.4* 80.9 79.1*  PLT 88* 92* 86* 82* 79*   Cardiac Enzymes: No results for input(s): CKTOTAL, CKMB, CKMBINDEX, TROPONINI in the last 168 hours. BNP: Invalid input(s): POCBNP CBG: Recent Labs  Lab 05/21/21 2014 05/21/21 2345 05/22/21 0426 05/22/21 0847 05/22/21 1233  GLUCAP 96 94 77 93 102*   D-Dimer No results for input(s): DDIMER in the last 72 hours. Hgb A1c No results for input(s): HGBA1C in the last 72 hours. Lipid Profile No results for input(s): CHOL, HDL, LDLCALC, TRIG, CHOLHDL, LDLDIRECT in the last 72 hours. Thyroid function studies No results for input(s): TSH, T4TOTAL, T3FREE, THYROIDAB in the last 72 hours.  Invalid input(s): FREET3 Anemia work up No results for input(s): VITAMINB12, FOLATE, FERRITIN, TIBC, IRON, RETICCTPCT in the last 72 hours. Urinalysis    Component Value Date/Time   COLORURINE AMBER (A) 04/20/2021 0704   APPEARANCEUR CLOUDY (A) 04/20/2021 0704   LABSPEC 1.023 04/20/2021 0704   PHURINE 5.0 04/20/2021 0704   GLUCOSEU NEGATIVE 04/20/2021 0704   HGBUR SMALL (A) 04/20/2021 0704   BILIRUBINUR SMALL (A) 04/20/2021 0704   KETONESUR NEGATIVE 04/20/2021 0704   PROTEINUR 30 (A) 04/20/2021 0704   UROBILINOGEN 1.0 09/12/2012 2055   NITRITE NEGATIVE 04/20/2021 0704   LEUKOCYTESUR LARGE (A) 04/20/2021 0704   Sepsis Labs Invalid input(s): PROCALCITONIN,  WBC,  LACTICIDVEN Microbiology Recent Results (from the past 240 hour(s))  SARS  CORONAVIRUS 2 (TAT 6-24 HRS) Nasopharyngeal Nasopharyngeal Swab     Status: Abnormal   Collection Time: 05/21/21  1:13 PM   Specimen: Nasopharyngeal Swab  Result Value Ref Range Status   SARS Coronavirus 2 POSITIVE (A) NEGATIVE Final    Comment: (NOTE) SARS-CoV-2 target nucleic acids are DETECTED.  The SARS-CoV-2 RNA is generally detectable in upper and lower respiratory specimens during the acute phase of infection. Positive results are indicative of the presence of SARS-CoV-2 RNA. Clinical correlation with patient history and other diagnostic information is  necessary to determine patient infection status. Positive results do not rule out bacterial infection or co-infection with other viruses.  The expected result is Negative.  Fact Sheet for Patients: SugarRoll.be  Fact Sheet for Healthcare Providers: https://www.woods-mathews.com/  This test is not yet approved or cleared by the [place] FDA and  has been authorized for detection and/or diagnosis of SARS-CoV-2 by FDA under an Emergency Use Authorization (EUA). This EUA will remain  in effect (meaning this test can be used) for the duration of the COVID-19 declaration under Section 564(b)(1) of the Act, 21 U. S.C. section 360bbb-3(b)(1), unless the authorization is terminated or revoked sooner.   Performed at [facility], [place] [address] [place], [place] [address]      Time coordinating discharge: Over 30 minutes  SIGNED:   Bhavik Cabiness J [place] DO  [facility] 05/22/2021, 2:12 PM

## 2021-05-22 NOTE — Progress Notes (Signed)
Heart Failure Navigator Progress Note  Assessed for Heart & Vascular TOC clinic readiness.  Patient does not meet criteria due to complicated extended hospitalization. Pt discharging to SNF, max assist. Pt has quick f/u cardiology appt for 9/8 with Laurann [place], NP.   Navigator available for reassessment of patient.   Pricilla Holm, MSN, RN Heart Failure Nurse Navigator [phone]

## 2021-05-22 NOTE — TOC Transition Note (Signed)
Transition of Care [facility]) - CM/SW Discharge Note   Patient Details  Name: Ryan [name] MRN: [id number] Date of Birth: [birth year]  [facility] CM/SW Contact:  Coralee Pesa, [place] Phone Number: 05/22/2021, 2:12 PM   Clinical Narrative:    Pt to be transported to [facility] via [place]. Nurse to call report to [phone].   Final next level of care: Skilled Nursing Facility Barriers to Discharge: Barriers Resolved   Patient Goals and CMS Choice Patient states their goals for this hospitalization and ongoing recovery are:: To go home CMS Medicare.gov Compare Post Acute Care list provided to:: Patient Choice offered to / list presented to : Patient, Adult Children, Sibling (Son and sister)  Discharge Placement              Patient chooses bed at: [facility] Patient to be transferred to facility by: [place] [facility] Name of family member notified: [name] Patient and family notified of of transfer: 05/22/21  Discharge Plan and Services In-house Referral: [facility] Discharge Planning Services: CM Consult Post Acute Care Choice: Home Health          DME Arranged: Negative pressure wound device, Vac ([place]) DME Agency: Other - Comment, [employer] Date DME Agency Contacted: 04/15/21 Time DME Agency Contacted: 1200 Representative spoke with at DME Agency: Olivia Mackie HH Arranged: RN, PT, OT [facility] Agency: [place] Date [place]: 04/15/21 Time [place]: 18 Representative spoke with at [place]: [place] ([place]) Interventions     Readmission Risk Interventions Readmission Risk Prevention Plan 04/15/2021  Home Care Screening Complete  Medication Review (RN CM) Complete  Some recent data might be hidden

## 2021-05-22 NOTE — Progress Notes (Signed)
Arrived to unit to pull PICC line as ordered. However, patient was up in the chair. Secure chatted nurse and notified to consult VAST when patient was back in bed. Fran Lowes, RN VAST

## 2021-05-22 NOTE — Progress Notes (Signed)
Attempted to call report at [facility].  Phone continued to ring and no one would pick up.

## 2021-05-22 NOTE — Progress Notes (Signed)
Physical Therapy Treatment Patient Details Name: Ryan [name] MRN: [id number] DOB: [birth year] Today's Date: 05/22/2021    History of Present Illness 75 y.o. male admitted 04/08/21 for R foot debridement and  R AK amp on 8/23.  R IJ temp HD catheter placement on 8/1.  Pt with L sided hydronephrosis possibly secondary to L retroperitoneal hematoma, s/p nephrostomy tube on 8/1 to drain. Pt with positive Covid-19 test collected 8/9.  PMH: Afib on Coumadin mitral valve replacement, chronic systolic heart failure with an AICD and placed, essential HTN, PVD,  peripheral vascular bypass.    PT Comments    Pt admitted with above diagnosis. Pt was able to practice sit to stands to the [place] with max assist of 2 with pt unable to stand fully. Pt assists with sliding board transfer but has difficulty due to weakness and needs 2 persons.  Pt awaiting SNF for continued therapy.  Pt currently with functional limitations due to balance and endurance deficits. Pt will benefit from skilled PT to increase their independence and safety with mobility to allow discharge to the venue listed below.      Follow Up Recommendations  SNF;Supervision/Assistance - 24 hour     Equipment Recommendations  Wheelchair cushion (measurements PT);Wheelchair (measurements PT)    Recommendations for Other Services       Precautions / Restrictions Precautions Precautions: Fall Precaution Comments: L nephrostomy tube/drain Required Braces or Orthoses: Other Brace Other Brace: L nephrostomy tube/drain Restrictions RLE Weight Bearing: Non weight bearing LLE Weight Bearing: Weight bearing as tolerated    Mobility  Bed Mobility Overal bed mobility: Needs Assistance Bed Mobility: Supine to Sit;Sit to Supine Rolling: Mod assist Sidelying to sit: Max assist;HOB elevated Supine to sit: HOB elevated;Max assist     General bed mobility comments: decreased initiation, cues needed for how to assist.    Transfers Overall  transfer level: Needs assistance   Transfers: Sit to/from Stand;Lateral/Scoot Transfers Sit to Stand: Max assist;+2 physical assistance;From elevated surface        Lateral/Scoot Transfers: Max assist;+2 physical assistance;With slide board;Mod assist General transfer comment: patient attempted to stand x3  reps to [facility]. On second and third attempts he was able to clear bottom but could not get standing all the way up.  Used sliding board to scoot pt to chair.  Ambulation/Gait                 Stairs             Wheelchair Mobility    Modified Rankin (Stroke Patients Only)       Balance Overall balance assessment: Needs assistance Sitting-balance support: Feet supported;Bilateral upper extremity supported Sitting balance-Leahy Scale: Poor Sitting balance - Comments: poor initial sitting balance requiring assist to maintain. B UE support. Requires assist to get left leg positioned to assist with supporting him in sitting. Postural control: Left lateral lean Standing balance support: Bilateral upper extremity supported;During functional activity Standing balance-Leahy Scale: Zero Standing balance comment: max assist+2  in the [place] for partial stand                            Cognition Arousal/Alertness: Awake/alert Behavior During Therapy: WFL for tasks assessed/performed Overall Cognitive Status: No family/caregiver present to determine baseline cognitive functioning Area of Impairment: Safety/judgement;Problem solving                 Orientation Level: Time;Situation Current Attention Level: Focused Memory: Decreased recall  of precautions;Decreased short-term memory Following Commands: Follows one step commands with increased time Safety/Judgement: Decreased awareness of safety;Decreased awareness of deficits Awareness: Intellectual Problem Solving: Slow processing;Decreased initiation;Requires verbal cues;Requires tactile cues;Difficulty  sequencing General Comments: pt oriented to self, at times making tangential or nonsensical statements and possibly having delirium. Pt following ~75% of simple 1-step cues but needs repetition of some cues due to confusion and slow processing.      Exercises General Exercises - Upper Extremity Shoulder Flexion: AAROM;Both;10 reps Elbow Flexion: AAROM;Both;10 reps General Exercises - Lower Extremity Ankle Circles/Pumps: AROM;Left;10 reps;Seated Long Arc Quad: AROM;AAROM;Left;Seated;10 reps Hip Flexion/Marching: AROM;Left;10 reps;Seated;Right Other Exercises Other Exercises: hip/knee flex/ext ROM with graded assist in flexion and graded resistance in extension on right Other Exercises: reach to the ceiling x 10 aarom.    General Comments General comments (skin integrity, edema, etc.): Pt VSS. Instructed in use of incentive spirometer.      Pertinent Vitals/Pain Pain Assessment: Faces Faces Pain Scale: Hurts even more Pain Location: R AKA, groin/buttocks when attempting to scoot Pain Descriptors / Indicators: Operative site guarding;Guarding;Grimacing;Tender Pain Intervention(s): Limited activity within patient's tolerance;Monitored during session;Premedicated before session;Repositioned    Home Living                      Prior Function            PT Goals (current goals can now be found in the care plan section) Acute Rehab PT Goals Patient Stated Goal: to hurt less Progress towards PT goals: Progressing toward goals    Frequency    Min 2X/week      PT Plan Current plan remains appropriate    Co-evaluation              AM-PAC PT "6 Clicks" Mobility   Outcome Measure  Help needed turning from your back to your side while in a flat bed without using bedrails?: A Lot Help needed moving from lying on your back to sitting on the side of a flat bed without using bedrails?: A Lot Help needed moving to and from a bed to a chair (including a wheelchair)?:  Total Help needed standing up from a chair using your arms (e.g., wheelchair or bedside chair)?: Total Help needed to walk in hospital room?: Total Help needed climbing 3-5 steps with a railing? : Total 6 Click Score: 8    End of Session Equipment Utilized During Treatment: Gait belt Activity Tolerance: Patient limited by fatigue Patient left: with call bell/phone within reach;in chair;with chair alarm set Nurse Communication: Mobility status;Need for lift equipment PT Visit Diagnosis: Other abnormalities of gait and mobility (R26.89);Muscle weakness (generalized) (M62.81);Pain Pain - Right/Left: Right Pain - part of body: Leg     Time: [id number] PT Time Calculation (min) (ACUTE ONLY): 30 min  Charges:  $Therapeutic Exercise: 8-22 mins $Therapeutic Activity: 8-22 mins                     Lyndzee Kliebert M,PT Acute Rehab Services [phone] (pager)    Alvira Philips 05/22/2021, 1:07 PM

## 2021-05-23 DIAGNOSIS — I4821 Permanent atrial fibrillation: Secondary | ICD-10-CM | POA: Diagnosis not present

## 2021-05-23 DIAGNOSIS — Z953 Presence of xenogenic heart valve: Secondary | ICD-10-CM | POA: Diagnosis not present

## 2021-05-23 DIAGNOSIS — R41841 Cognitive communication deficit: Secondary | ICD-10-CM | POA: Diagnosis not present

## 2021-05-23 DIAGNOSIS — R0689 Other abnormalities of breathing: Secondary | ICD-10-CM | POA: Diagnosis not present

## 2021-05-23 DIAGNOSIS — Z7901 Long term (current) use of anticoagulants: Secondary | ICD-10-CM | POA: Diagnosis not present

## 2021-05-23 DIAGNOSIS — M2550 Pain in unspecified joint: Secondary | ICD-10-CM | POA: Diagnosis not present

## 2021-05-23 DIAGNOSIS — L89896 Pressure-induced deep tissue damage of other site: Secondary | ICD-10-CM | POA: Diagnosis not present

## 2021-05-23 DIAGNOSIS — T83022A Displacement of nephrostomy catheter, initial encounter: Secondary | ICD-10-CM | POA: Diagnosis not present

## 2021-05-23 DIAGNOSIS — I130 Hypertensive heart and chronic kidney disease with heart failure and stage 1 through stage 4 chronic kidney disease, or unspecified chronic kidney disease: Secondary | ICD-10-CM | POA: Diagnosis not present

## 2021-05-23 DIAGNOSIS — M79672 Pain in left foot: Secondary | ICD-10-CM | POA: Diagnosis not present

## 2021-05-23 DIAGNOSIS — R1312 Dysphagia, oropharyngeal phase: Secondary | ICD-10-CM | POA: Diagnosis not present

## 2021-05-23 DIAGNOSIS — N1339 Other hydronephrosis: Secondary | ICD-10-CM | POA: Diagnosis not present

## 2021-05-23 DIAGNOSIS — I96 Gangrene, not elsewhere classified: Secondary | ICD-10-CM | POA: Diagnosis not present

## 2021-05-23 DIAGNOSIS — N179 Acute kidney failure, unspecified: Secondary | ICD-10-CM | POA: Diagnosis not present

## 2021-05-23 DIAGNOSIS — N1330 Unspecified hydronephrosis: Secondary | ICD-10-CM | POA: Diagnosis not present

## 2021-05-23 DIAGNOSIS — J9601 Acute respiratory failure with hypoxia: Secondary | ICD-10-CM | POA: Diagnosis not present

## 2021-05-23 DIAGNOSIS — D62 Acute posthemorrhagic anemia: Secondary | ICD-10-CM | POA: Diagnosis not present

## 2021-05-23 DIAGNOSIS — E1152 Type 2 diabetes mellitus with diabetic peripheral angiopathy with gangrene: Secondary | ICD-10-CM | POA: Diagnosis not present

## 2021-05-23 DIAGNOSIS — I82429 Acute embolism and thrombosis of unspecified iliac vein: Secondary | ICD-10-CM | POA: Diagnosis not present

## 2021-05-23 DIAGNOSIS — I471 Supraventricular tachycardia: Secondary | ICD-10-CM | POA: Diagnosis not present

## 2021-05-23 DIAGNOSIS — L89152 Pressure ulcer of sacral region, stage 2: Secondary | ICD-10-CM | POA: Diagnosis present

## 2021-05-23 DIAGNOSIS — E1122 Type 2 diabetes mellitus with diabetic chronic kidney disease: Secondary | ICD-10-CM | POA: Diagnosis present

## 2021-05-23 DIAGNOSIS — Z8673 Personal history of transient ischemic attack (TIA), and cerebral infarction without residual deficits: Secondary | ICD-10-CM | POA: Diagnosis not present

## 2021-05-23 DIAGNOSIS — R0902 Hypoxemia: Secondary | ICD-10-CM | POA: Diagnosis not present

## 2021-05-23 DIAGNOSIS — D649 Anemia, unspecified: Secondary | ICD-10-CM | POA: Diagnosis not present

## 2021-05-23 DIAGNOSIS — I700 Atherosclerosis of aorta: Secondary | ICD-10-CM | POA: Diagnosis not present

## 2021-05-23 DIAGNOSIS — L89153 Pressure ulcer of sacral region, stage 3: Secondary | ICD-10-CM | POA: Diagnosis not present

## 2021-05-23 DIAGNOSIS — Z8616 Personal history of COVID-19: Secondary | ICD-10-CM | POA: Diagnosis not present

## 2021-05-23 DIAGNOSIS — Y828 Other medical devices associated with adverse incidents: Secondary | ICD-10-CM | POA: Diagnosis not present

## 2021-05-23 DIAGNOSIS — N131 Hydronephrosis with ureteral stricture, not elsewhere classified: Secondary | ICD-10-CM | POA: Diagnosis not present

## 2021-05-23 DIAGNOSIS — Z139 Encounter for screening, unspecified: Secondary | ICD-10-CM | POA: Diagnosis not present

## 2021-05-23 DIAGNOSIS — Z23 Encounter for immunization: Secondary | ICD-10-CM | POA: Diagnosis not present

## 2021-05-23 DIAGNOSIS — R000 Tachycardia, unspecified: Secondary | ICD-10-CM | POA: Diagnosis not present

## 2021-05-23 DIAGNOSIS — Z89611 Acquired absence of right leg above knee: Secondary | ICD-10-CM | POA: Diagnosis not present

## 2021-05-23 DIAGNOSIS — I70245 Atherosclerosis of native arteries of left leg with ulceration of other part of foot: Secondary | ICD-10-CM | POA: Diagnosis not present

## 2021-05-23 DIAGNOSIS — L03116 Cellulitis of left lower limb: Secondary | ICD-10-CM | POA: Diagnosis present

## 2021-05-23 DIAGNOSIS — A409 Streptococcal sepsis, unspecified: Secondary | ICD-10-CM | POA: Diagnosis not present

## 2021-05-23 DIAGNOSIS — Z95810 Presence of automatic (implantable) cardiac defibrillator: Secondary | ICD-10-CM | POA: Diagnosis not present

## 2021-05-23 DIAGNOSIS — R5383 Other fatigue: Secondary | ICD-10-CM | POA: Diagnosis not present

## 2021-05-23 DIAGNOSIS — Z86711 Personal history of pulmonary embolism: Secondary | ICD-10-CM | POA: Diagnosis not present

## 2021-05-23 DIAGNOSIS — N183 Chronic kidney disease, stage 3 unspecified: Secondary | ICD-10-CM | POA: Diagnosis not present

## 2021-05-23 DIAGNOSIS — K661 Hemoperitoneum: Secondary | ICD-10-CM | POA: Diagnosis not present

## 2021-05-23 DIAGNOSIS — R401 Stupor: Secondary | ICD-10-CM | POA: Diagnosis not present

## 2021-05-23 DIAGNOSIS — D631 Anemia in chronic kidney disease: Secondary | ICD-10-CM | POA: Diagnosis present

## 2021-05-23 DIAGNOSIS — K8080 Other cholelithiasis without obstruction: Secondary | ICD-10-CM | POA: Diagnosis not present

## 2021-05-23 DIAGNOSIS — I70222 Atherosclerosis of native arteries of extremities with rest pain, left leg: Secondary | ICD-10-CM | POA: Diagnosis not present

## 2021-05-23 DIAGNOSIS — J9811 Atelectasis: Secondary | ICD-10-CM | POA: Diagnosis not present

## 2021-05-23 DIAGNOSIS — Z936 Other artificial openings of urinary tract status: Secondary | ICD-10-CM | POA: Diagnosis not present

## 2021-05-23 DIAGNOSIS — Z8679 Personal history of other diseases of the circulatory system: Secondary | ICD-10-CM | POA: Diagnosis not present

## 2021-05-23 DIAGNOSIS — Z8249 Family history of ischemic heart disease and other diseases of the circulatory system: Secondary | ICD-10-CM | POA: Diagnosis not present

## 2021-05-23 DIAGNOSIS — I420 Dilated cardiomyopathy: Secondary | ICD-10-CM | POA: Diagnosis present

## 2021-05-23 DIAGNOSIS — N1832 Chronic kidney disease, stage 3b: Secondary | ICD-10-CM | POA: Diagnosis present

## 2021-05-23 DIAGNOSIS — N99528 Other complication of other external stoma of urinary tract: Secondary | ICD-10-CM | POA: Diagnosis not present

## 2021-05-23 DIAGNOSIS — S36892A Contusion of other intra-abdominal organs, initial encounter: Secondary | ICD-10-CM | POA: Diagnosis not present

## 2021-05-23 DIAGNOSIS — L89620 Pressure ulcer of left heel, unstageable: Secondary | ICD-10-CM | POA: Diagnosis not present

## 2021-05-23 DIAGNOSIS — R0989 Other specified symptoms and signs involving the circulatory and respiratory systems: Secondary | ICD-10-CM | POA: Diagnosis not present

## 2021-05-23 DIAGNOSIS — Z79899 Other long term (current) drug therapy: Secondary | ICD-10-CM | POA: Diagnosis not present

## 2021-05-23 DIAGNOSIS — R7881 Bacteremia: Secondary | ICD-10-CM | POA: Diagnosis not present

## 2021-05-23 DIAGNOSIS — I998 Other disorder of circulatory system: Secondary | ICD-10-CM | POA: Diagnosis not present

## 2021-05-23 DIAGNOSIS — E119 Type 2 diabetes mellitus without complications: Secondary | ICD-10-CM | POA: Diagnosis not present

## 2021-05-23 DIAGNOSIS — A419 Sepsis, unspecified organism: Secondary | ICD-10-CM | POA: Diagnosis not present

## 2021-05-23 DIAGNOSIS — I959 Hypotension, unspecified: Secondary | ICD-10-CM | POA: Diagnosis not present

## 2021-05-23 DIAGNOSIS — R918 Other nonspecific abnormal finding of lung field: Secondary | ICD-10-CM | POA: Diagnosis not present

## 2021-05-23 DIAGNOSIS — M6281 Muscle weakness (generalized): Secondary | ICD-10-CM | POA: Diagnosis not present

## 2021-05-23 DIAGNOSIS — I82422 Acute embolism and thrombosis of left iliac vein: Secondary | ICD-10-CM | POA: Diagnosis not present

## 2021-05-23 DIAGNOSIS — L89150 Pressure ulcer of sacral region, unstageable: Secondary | ICD-10-CM | POA: Diagnosis not present

## 2021-05-23 DIAGNOSIS — E785 Hyperlipidemia, unspecified: Secondary | ICD-10-CM | POA: Diagnosis not present

## 2021-05-23 DIAGNOSIS — R6520 Severe sepsis without septic shock: Secondary | ICD-10-CM | POA: Diagnosis not present

## 2021-05-23 DIAGNOSIS — M79605 Pain in left leg: Secondary | ICD-10-CM | POA: Diagnosis not present

## 2021-05-23 DIAGNOSIS — I2699 Other pulmonary embolism without acute cor pulmonale: Secondary | ICD-10-CM | POA: Diagnosis not present

## 2021-05-23 DIAGNOSIS — K8020 Calculus of gallbladder without cholecystitis without obstruction: Secondary | ICD-10-CM | POA: Diagnosis not present

## 2021-05-23 DIAGNOSIS — R4182 Altered mental status, unspecified: Secondary | ICD-10-CM | POA: Diagnosis present

## 2021-05-23 DIAGNOSIS — K9423 Gastrostomy malfunction: Secondary | ICD-10-CM | POA: Diagnosis not present

## 2021-05-23 DIAGNOSIS — R410 Disorientation, unspecified: Secondary | ICD-10-CM | POA: Diagnosis not present

## 2021-05-23 DIAGNOSIS — I70262 Atherosclerosis of native arteries of extremities with gangrene, left leg: Secondary | ICD-10-CM | POA: Diagnosis not present

## 2021-05-23 DIAGNOSIS — G9341 Metabolic encephalopathy: Secondary | ICD-10-CM | POA: Diagnosis not present

## 2021-05-23 DIAGNOSIS — E46 Unspecified protein-calorie malnutrition: Secondary | ICD-10-CM | POA: Diagnosis not present

## 2021-05-23 DIAGNOSIS — E1169 Type 2 diabetes mellitus with other specified complication: Secondary | ICD-10-CM | POA: Diagnosis not present

## 2021-05-23 DIAGNOSIS — I739 Peripheral vascular disease, unspecified: Secondary | ICD-10-CM | POA: Diagnosis not present

## 2021-05-23 DIAGNOSIS — A408 Other streptococcal sepsis: Secondary | ICD-10-CM | POA: Diagnosis not present

## 2021-05-23 DIAGNOSIS — Z7401 Bed confinement status: Secondary | ICD-10-CM | POA: Diagnosis not present

## 2021-05-23 DIAGNOSIS — I5022 Chronic systolic (congestive) heart failure: Secondary | ICD-10-CM | POA: Diagnosis not present

## 2021-05-23 DIAGNOSIS — S78112A Complete traumatic amputation at level between left hip and knee, initial encounter: Secondary | ICD-10-CM | POA: Diagnosis not present

## 2021-05-23 DIAGNOSIS — B955 Unspecified streptococcus as the cause of diseases classified elsewhere: Secondary | ICD-10-CM | POA: Diagnosis not present

## 2021-05-23 DIAGNOSIS — L03032 Cellulitis of left toe: Secondary | ICD-10-CM | POA: Diagnosis not present

## 2021-05-23 DIAGNOSIS — D508 Other iron deficiency anemias: Secondary | ICD-10-CM | POA: Diagnosis not present

## 2021-05-23 DIAGNOSIS — Z4781 Encounter for orthopedic aftercare following surgical amputation: Secondary | ICD-10-CM | POA: Diagnosis not present

## 2021-05-23 DIAGNOSIS — Z20822 Contact with and (suspected) exposure to covid-19: Secondary | ICD-10-CM | POA: Diagnosis not present

## 2021-05-23 DIAGNOSIS — L89623 Pressure ulcer of left heel, stage 3: Secondary | ICD-10-CM | POA: Diagnosis not present

## 2021-05-23 DIAGNOSIS — E876 Hypokalemia: Secondary | ICD-10-CM | POA: Diagnosis not present

## 2021-05-23 DIAGNOSIS — I10 Essential (primary) hypertension: Secondary | ICD-10-CM | POA: Diagnosis not present

## 2021-05-23 DIAGNOSIS — E1159 Type 2 diabetes mellitus with other circulatory complications: Secondary | ICD-10-CM | POA: Diagnosis not present

## 2021-05-23 DIAGNOSIS — R051 Acute cough: Secondary | ICD-10-CM | POA: Diagnosis not present

## 2021-05-23 DIAGNOSIS — Z86718 Personal history of other venous thrombosis and embolism: Secondary | ICD-10-CM | POA: Diagnosis not present

## 2021-05-23 DIAGNOSIS — R404 Transient alteration of awareness: Secondary | ICD-10-CM | POA: Diagnosis not present

## 2021-05-23 NOTE — Final Progress Note (Signed)
Patient in a stable condition, report called to RN at [facility], all questions answered.Ryan [name]

## 2021-05-23 NOTE — Progress Notes (Signed)
Patient transported to [place] rehab along with his personal belongings by [facility]

## 2021-05-26 ENCOUNTER — Ambulatory Visit: Payer: Medicare HMO | Admitting: Cardiology

## 2021-05-26 DIAGNOSIS — Z89611 Acquired absence of right leg above knee: Secondary | ICD-10-CM | POA: Diagnosis not present

## 2021-05-26 DIAGNOSIS — L89896 Pressure-induced deep tissue damage of other site: Secondary | ICD-10-CM | POA: Diagnosis not present

## 2021-05-26 DIAGNOSIS — I739 Peripheral vascular disease, unspecified: Secondary | ICD-10-CM | POA: Diagnosis not present

## 2021-05-26 DIAGNOSIS — L03032 Cellulitis of left toe: Secondary | ICD-10-CM | POA: Diagnosis not present

## 2021-05-27 DIAGNOSIS — L89620 Pressure ulcer of left heel, unstageable: Secondary | ICD-10-CM | POA: Diagnosis not present

## 2021-05-27 DIAGNOSIS — I70245 Atherosclerosis of native arteries of left leg with ulceration of other part of foot: Secondary | ICD-10-CM | POA: Diagnosis not present

## 2021-05-27 DIAGNOSIS — L89150 Pressure ulcer of sacral region, unstageable: Secondary | ICD-10-CM | POA: Diagnosis not present

## 2021-05-27 DIAGNOSIS — E1159 Type 2 diabetes mellitus with other circulatory complications: Secondary | ICD-10-CM | POA: Diagnosis not present

## 2021-05-28 ENCOUNTER — Ambulatory Visit (HOSPITAL_BASED_OUTPATIENT_CLINIC_OR_DEPARTMENT_OTHER): Payer: Medicare HMO | Admitting: Family

## 2021-05-28 DIAGNOSIS — I4821 Permanent atrial fibrillation: Secondary | ICD-10-CM | POA: Diagnosis not present

## 2021-05-28 DIAGNOSIS — N179 Acute kidney failure, unspecified: Secondary | ICD-10-CM | POA: Diagnosis not present

## 2021-05-28 DIAGNOSIS — I2699 Other pulmonary embolism without acute cor pulmonale: Secondary | ICD-10-CM | POA: Diagnosis not present

## 2021-05-28 DIAGNOSIS — Z89611 Acquired absence of right leg above knee: Secondary | ICD-10-CM | POA: Diagnosis not present

## 2021-05-29 DIAGNOSIS — Z86711 Personal history of pulmonary embolism: Secondary | ICD-10-CM | POA: Diagnosis not present

## 2021-05-29 DIAGNOSIS — Z7901 Long term (current) use of anticoagulants: Secondary | ICD-10-CM | POA: Diagnosis not present

## 2021-05-29 DIAGNOSIS — R0989 Other specified symptoms and signs involving the circulatory and respiratory systems: Secondary | ICD-10-CM | POA: Diagnosis not present

## 2021-05-29 DIAGNOSIS — R051 Acute cough: Secondary | ICD-10-CM | POA: Diagnosis not present

## 2021-05-29 DIAGNOSIS — Z8616 Personal history of COVID-19: Secondary | ICD-10-CM | POA: Diagnosis not present

## 2021-06-03 ENCOUNTER — Telehealth (HOSPITAL_COMMUNITY): Payer: Self-pay

## 2021-06-03 ENCOUNTER — Other Ambulatory Visit (HOSPITAL_COMMUNITY): Payer: Self-pay | Admitting: Interventional Radiology

## 2021-06-03 ENCOUNTER — Ambulatory Visit (INDEPENDENT_AMBULATORY_CARE_PROVIDER_SITE_OTHER): Payer: Medicare HMO

## 2021-06-03 DIAGNOSIS — I70245 Atherosclerosis of native arteries of left leg with ulceration of other part of foot: Secondary | ICD-10-CM | POA: Diagnosis not present

## 2021-06-03 DIAGNOSIS — Z95810 Presence of automatic (implantable) cardiac defibrillator: Secondary | ICD-10-CM | POA: Diagnosis not present

## 2021-06-03 DIAGNOSIS — L89620 Pressure ulcer of left heel, unstageable: Secondary | ICD-10-CM | POA: Diagnosis not present

## 2021-06-03 DIAGNOSIS — E1159 Type 2 diabetes mellitus with other circulatory complications: Secondary | ICD-10-CM | POA: Diagnosis not present

## 2021-06-03 DIAGNOSIS — I5022 Chronic systolic (congestive) heart failure: Secondary | ICD-10-CM | POA: Diagnosis not present

## 2021-06-03 DIAGNOSIS — N135 Crossing vessel and stricture of ureter without hydronephrosis: Secondary | ICD-10-CM

## 2021-06-03 DIAGNOSIS — L89150 Pressure ulcer of sacral region, unstageable: Secondary | ICD-10-CM | POA: Diagnosis not present

## 2021-06-03 NOTE — Telephone Encounter (Signed)
Called to schedule nephrostogram, no answer. AW

## 2021-06-03 NOTE — Progress Notes (Signed)
EPIC Encounter for ICM Monitoring  Patient Name: Ryan [name] is a 75 y.o. male Date: 06/03/2021 Primary Care Physican: [name], MD Primary Cardiologist: [facility] Electrophysiologist: [name] 03/02/2021 Weight: 159 lbs   Time in AT/AF   0.0 hr/day (0.0%)          Attempted call to patient.  Patient currently in SNF and unable to reach.   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   Fluid index > normal threshold since 02/03/2021.   Prescribed:  Furosemide and Spironolactone were discontinued at 9/2 hospital discharge.   Labs: 05/13/2021 Creatinine 1.23, BUN 31, Potassium 4.6, Sodium 141, GFR >60 05/10/2021 Creatinine 1.13, BUN 45, Potassium 4.6, Sodium 141, GFR >60  05/08/2021 Creatinine 1.29, BUN 50, Potassium 4.3, Sodium 141, GFR 57  05/06/2021 Creatinine 1.57, BUN 59, Potassium 4.1, Sodium 143, GFR 45  05/05/2021 Creatinine 1.58, BUN 61, Potassium 4.2, Sodium 142, GFR 45  05/04/2021 Creatinine 1.45, BUN 60, Potassium 5.0, Sodium 142, GFR 50  05/03/2021 Creatinine 1.28, BUN 55, Potassium 5.0, Sodium 141, GFR 58  A complete set of results can be found in Results Review.   Recommendations:   Patient currently being monitored in SNF.    Follow-up plan: Pt unable to participate in ICM monthly follow up due to in SNF.    91 day monitoring will continue by device clinic and next 91 day remote transmission 06/15/2021.     EP/Cardiology Office Visits:  None scheduled at this time, patient currently in SNF   Copy of ICM check sent to Dr. [name].   3 month ICM trend: 06/03/2021.    1 Year ICM trend:       Rosalene Billings, RN 06/03/2021 9:34 AM

## 2021-06-04 DIAGNOSIS — I739 Peripheral vascular disease, unspecified: Secondary | ICD-10-CM | POA: Diagnosis not present

## 2021-06-04 DIAGNOSIS — L89896 Pressure-induced deep tissue damage of other site: Secondary | ICD-10-CM | POA: Diagnosis not present

## 2021-06-04 DIAGNOSIS — Z89611 Acquired absence of right leg above knee: Secondary | ICD-10-CM | POA: Diagnosis not present

## 2021-06-06 ENCOUNTER — Observation Stay (HOSPITAL_COMMUNITY)
Admission: EM | Admit: 2021-06-06 | Discharge: 2021-06-07 | Disposition: A | Discharge status: Home / Self Care | Payer: Medicare HMO | Attending: Internal Medicine | Admitting: Internal Medicine

## 2021-06-06 ENCOUNTER — Encounter (HOSPITAL_COMMUNITY): Payer: Self-pay | Admitting: Internal Medicine

## 2021-06-06 ENCOUNTER — Emergency Department (HOSPITAL_COMMUNITY): Payer: Medicare HMO

## 2021-06-06 ENCOUNTER — Other Ambulatory Visit: Payer: Self-pay

## 2021-06-06 DIAGNOSIS — Z20822 Contact with and (suspected) exposure to covid-19: Secondary | ICD-10-CM | POA: Diagnosis not present

## 2021-06-06 DIAGNOSIS — Z79899 Other long term (current) drug therapy: Secondary | ICD-10-CM | POA: Insufficient documentation

## 2021-06-06 DIAGNOSIS — N99528 Other complication of other external stoma of urinary tract: Secondary | ICD-10-CM | POA: Diagnosis present

## 2021-06-06 DIAGNOSIS — I10 Essential (primary) hypertension: Secondary | ICD-10-CM | POA: Diagnosis present

## 2021-06-06 DIAGNOSIS — Z86711 Personal history of pulmonary embolism: Secondary | ICD-10-CM | POA: Insufficient documentation

## 2021-06-06 DIAGNOSIS — K8020 Calculus of gallbladder without cholecystitis without obstruction: Secondary | ICD-10-CM | POA: Diagnosis not present

## 2021-06-06 DIAGNOSIS — T83022A Displacement of nephrostomy catheter, initial encounter: Principal | ICD-10-CM | POA: Insufficient documentation

## 2021-06-06 DIAGNOSIS — D649 Anemia, unspecified: Secondary | ICD-10-CM | POA: Diagnosis present

## 2021-06-06 DIAGNOSIS — I82409 Acute embolism and thrombosis of unspecified deep veins of unspecified lower extremity: Secondary | ICD-10-CM

## 2021-06-06 DIAGNOSIS — I5022 Chronic systolic (congestive) heart failure: Secondary | ICD-10-CM | POA: Diagnosis not present

## 2021-06-06 DIAGNOSIS — I2699 Other pulmonary embolism without acute cor pulmonale: Secondary | ICD-10-CM | POA: Diagnosis present

## 2021-06-06 DIAGNOSIS — N1330 Unspecified hydronephrosis: Secondary | ICD-10-CM | POA: Diagnosis not present

## 2021-06-06 DIAGNOSIS — Y828 Other medical devices associated with adverse incidents: Secondary | ICD-10-CM | POA: Diagnosis not present

## 2021-06-06 DIAGNOSIS — R918 Other nonspecific abnormal finding of lung field: Secondary | ICD-10-CM | POA: Diagnosis present

## 2021-06-06 DIAGNOSIS — Z7901 Long term (current) use of anticoagulants: Secondary | ICD-10-CM | POA: Diagnosis not present

## 2021-06-06 DIAGNOSIS — D508 Other iron deficiency anemias: Secondary | ICD-10-CM | POA: Insufficient documentation

## 2021-06-06 DIAGNOSIS — R000 Tachycardia, unspecified: Secondary | ICD-10-CM | POA: Diagnosis not present

## 2021-06-06 DIAGNOSIS — I471 Supraventricular tachycardia: Secondary | ICD-10-CM | POA: Diagnosis present

## 2021-06-06 DIAGNOSIS — E119 Type 2 diabetes mellitus without complications: Secondary | ICD-10-CM | POA: Diagnosis not present

## 2021-06-06 DIAGNOSIS — I739 Peripheral vascular disease, unspecified: Secondary | ICD-10-CM | POA: Diagnosis present

## 2021-06-06 DIAGNOSIS — K661 Hemoperitoneum: Secondary | ICD-10-CM | POA: Insufficient documentation

## 2021-06-06 DIAGNOSIS — K8080 Other cholelithiasis without obstruction: Secondary | ICD-10-CM | POA: Diagnosis not present

## 2021-06-06 DIAGNOSIS — S36892A Contusion of other intra-abdominal organs, initial encounter: Secondary | ICD-10-CM | POA: Diagnosis not present

## 2021-06-06 HISTORY — DX: Other pulmonary embolism without acute cor pulmonale: I26.99

## 2021-06-06 HISTORY — DX: Acute embolism and thrombosis of unspecified deep veins of unspecified lower extremity: I82.409

## 2021-06-06 LAB — PROTIME-INR
INR: 1.8 — ABNORMAL HIGH (ref 0.8–1.2)
Prothrombin Time: 20.4 seconds — ABNORMAL HIGH (ref 11.4–15.2)

## 2021-06-06 LAB — COMPREHENSIVE METABOLIC PANEL
ALT: 16 U/L (ref 0–44)
AST: 28 U/L (ref 15–41)
Albumin: 2.8 g/dL — ABNORMAL LOW (ref 3.5–5.0)
Alkaline Phosphatase: 115 U/L (ref 38–126)
Anion gap: 6 (ref 5–15)
BUN: 29 mg/dL — ABNORMAL HIGH (ref 8–23)
CO2: 28 mmol/L (ref 22–32)
Calcium: 8.9 mg/dL (ref 8.9–10.3)
Chloride: 108 mmol/L (ref 98–111)
Creatinine, Ser: 1.16 mg/dL (ref 0.61–1.24)
GFR, Estimated: >60 mL/min (ref >60)
Glucose, Bld: 100 mg/dL — ABNORMAL HIGH (ref 70–99)
Potassium: 4.1 mmol/L (ref 3.5–5.1)
Sodium: 142 mmol/L (ref 135–145)
Total Bilirubin: 1 mg/dL (ref 0.3–1.2)
Total Protein: 6.8 g/dL (ref 6.5–8.1)

## 2021-06-06 LAB — CBC WITH DIFFERENTIAL/PLATELET
Abs Immature Granulocytes: 0.05 10*3/uL (ref 0.00–0.07)
Basophils Absolute: 0.1 10*3/uL (ref 0.0–0.1)
Basophils Relative: 1 %
Eosinophils Absolute: 0.2 10*3/uL (ref 0.0–0.5)
Eosinophils Relative: 2 %
HCT: 33.7 % — ABNORMAL LOW (ref 39.0–52.0)
Hemoglobin: 9.7 g/dL — ABNORMAL LOW (ref 13.0–17.0)
Immature Granulocytes: 1 %
Lymphocytes Relative: 25 %
Lymphs Abs: 1.9 10*3/uL (ref 0.7–4.0)
MCH: 22.5 pg — ABNORMAL LOW (ref 26.0–34.0)
MCHC: 28.8 g/dL — ABNORMAL LOW (ref 30.0–36.0)
MCV: 78 fL — ABNORMAL LOW (ref 80.0–100.0)
Monocytes Absolute: 1.2 10*3/uL — ABNORMAL HIGH (ref 0.1–1.0)
Monocytes Relative: 16 %
Neutro Abs: 4.4 10*3/uL (ref 1.7–7.7)
Neutrophils Relative %: 55 %
Platelets: 339 10*3/uL (ref 150–400)
RBC: 4.32 MIL/uL (ref 4.22–5.81)
RDW: 21 % — ABNORMAL HIGH (ref 11.5–15.5)
WBC: 7.8 10*3/uL (ref 4.0–10.5)
nRBC: 0 % (ref 0.0–0.2)

## 2021-06-06 LAB — RESP PANEL BY RT-PCR (FLU A&B, COVID) ARPGX2
Influenza A by PCR: NEGATIVE
Influenza B by PCR: NEGATIVE
SARS Coronavirus 2 by RT PCR: NEGATIVE

## 2021-06-06 MED ORDER — BISACODYL 5 MG PO TBEC: 5.0000 mg | DELAYED_RELEASE_TABLET | Freq: Every day | ORAL | Status: DC | PRN

## 2021-06-06 MED ORDER — ACETAMINOPHEN 325 MG PO TABS
650.0000 mg | ORAL_TABLET | Freq: Four times a day (QID) | ORAL | Status: DC | PRN
  Administered 2021-06-07 (×2): 650 mg via ORAL
  Filled 2021-06-06 (×2): qty 2

## 2021-06-06 MED ORDER — PRO-STAT SUGAR FREE PO LIQD
30.0000 mL | Freq: Three times a day (TID) | ORAL | Status: DC
  Filled 2021-06-06: qty 30

## 2021-06-06 MED ORDER — DOCUSATE SODIUM 100 MG PO CAPS
100.0000 mg | ORAL_CAPSULE | Freq: Every morning | ORAL | Status: DC
  Administered 2021-06-07: 100 mg via ORAL
  Filled 2021-06-06: qty 1

## 2021-06-06 MED ORDER — APIXABAN 5 MG PO TABS
5.0000 mg | ORAL_TABLET | Freq: Two times a day (BID) | ORAL | Status: DC
  Administered 2021-06-06: 5 mg via ORAL
  Filled 2021-06-06: qty 1

## 2021-06-06 MED ORDER — AMIODARONE HCL 200 MG PO TABS
200.0000 mg | ORAL_TABLET | Freq: Every morning | ORAL | Status: DC
  Administered 2021-06-07: 200 mg via ORAL
  Filled 2021-06-06: qty 1

## 2021-06-06 MED ORDER — GUAIFENESIN 100 MG/5ML PO SOLN: 5.0000 mL | ORAL | Status: DC | PRN

## 2021-06-06 MED ORDER — PROSOURCE PLUS PO LIQD
30.0000 mL | Freq: Three times a day (TID) | ORAL | Status: DC
  Administered 2021-06-07 (×3): 30 mL via ORAL
  Filled 2021-06-06 (×3): qty 30

## 2021-06-06 MED ORDER — ATORVASTATIN CALCIUM 40 MG PO TABS
40.0000 mg | ORAL_TABLET | Freq: Every day | ORAL | Status: DC
  Administered 2021-06-06 – 2021-06-07 (×2): 40 mg via ORAL
  Filled 2021-06-06 (×2): qty 1

## 2021-06-06 MED ORDER — PANTOPRAZOLE SODIUM 40 MG PO TBEC
40.0000 mg | DELAYED_RELEASE_TABLET | Freq: Every morning | ORAL | Status: DC
  Administered 2021-06-07: 40 mg via ORAL
  Filled 2021-06-06: qty 1

## 2021-06-06 MED ORDER — GABAPENTIN 300 MG PO CAPS
300.0000 mg | ORAL_CAPSULE | Freq: Every day | ORAL | Status: DC
  Administered 2021-06-06 – 2021-06-07 (×2): 300 mg via ORAL
  Filled 2021-06-06 (×2): qty 1

## 2021-06-06 MED ORDER — MIDODRINE HCL 5 MG PO TABS
10.0000 mg | ORAL_TABLET | Freq: Three times a day (TID) | ORAL | Status: DC
  Administered 2021-06-07 (×2): 10 mg via ORAL
  Filled 2021-06-06 (×4): qty 2

## 2021-06-06 MED ORDER — ACETAMINOPHEN 650 MG RE SUPP: 650.0000 mg | Freq: Four times a day (QID) | RECTAL | Status: DC | PRN

## 2021-06-06 MED ORDER — EMPAGLIFLOZIN 10 MG PO TABS
10.0000 mg | ORAL_TABLET | Freq: Every day | ORAL | Status: DC
  Administered 2021-06-07: 10 mg via ORAL
  Filled 2021-06-06: qty 1

## 2021-06-06 MED ORDER — PROCHLORPERAZINE EDISYLATE 10 MG/2ML IJ SOLN: 5.0000 mg | Freq: Four times a day (QID) | INTRAMUSCULAR | Status: DC | PRN

## 2021-06-06 NOTE — ED Triage Notes (Signed)
Patient BIB EMS from SNF [facility]). Per report pt left Nephrostomy tube accidentally pulled while turning patient in the facility. No complain of pain. Pt a/xo4.    Temp 98 BP 143/79 HR 82 RR 16 O2sat 100%

## 2021-06-06 NOTE — H&P (Signed)
History and Physical    Ryan [name] BVA:[id number] DOB: 08-08-1946 DOA: 06/06/2021  PCP: [name], MD   Patient coming from: Home.   I have personally briefly reviewed patient's old medical records in [place]  Chief Complaint: Nephrostomy tube pulled out accidentally.  HPI: Ryan [name] is a 75 y.o. 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  Unfortunately the patient's limb ischemia kept worsening and the patient had to undergo a right AKA.  While he was admitted the patient developed left hydronephrosis and had a nephrostomy tube inserted on 04/20/2021 by Dr. Earleen Newport.  He was also treated for COVID and bacterial pneumonia.  He was diagnosed with a left external iliac vein DVT and pulmonary embolism l on 05/05/2021.  ED Course: Initial vital signs were temperature 97.8 F, pulse 74, respiration 15, BP 146/71 mmHg and O2 sat 100% on room air.  Lab work: CBC showed a white count of 7.80 with a normal differential, hemoglobin 9.7 g/dL platelets 339.  PT was 28.4 and INR 1.8.  CMP showed a glucose of 100 and BUN of 29 mg/dL.  Albumin was 2.8 g/dL.  The rest of the CMP results were unremarkable.  Imaging: CT abdomen/pelvis without contrast showed the left retroperitoneal organizing hematoma appears minimally decreased since previous studies.  The left nephrostomy tube is.  There is mild progression of left hydronephrosis.  Diffuse aortic atherosclerosis.  Prominent bronchiectasis and fibrosis in the lung bases.  New cavitary nodules in the right lung base since previous study,  likely inflammatory.  Follow-up suggested in 3 months.  There was cholelithiasis with acute cholecystitis.  Please see images and full radiology report for further information.  Review of Systems: As per HPI otherwise all other systems reviewed and are negative.  Past Medical History:  Diagnosis Date   AICD (automatic cardioverter/defibrillator) present    Arthritis    Atrial fibrillation (HCC)    Cardiomyopathy, dilated (HCC)    CHF (congestive heart failure) (HCC)    Diabetes mellitus without complication ([place])    DVT of left external iliac vein 04/2021 06/06/2021   Endocarditis    Hypertension    Peripheral vascular disease ([place])    Pulmonary emboli 04/2021 06/06/2021   PVC's (premature ventricular contractions)    SVT (supraventricular tachycardia)  long RP    Past Surgical History:  Procedure Laterality Date   ABDOMINAL AORTAGRAM N/A 10/03/2012   Procedure: ABDOMINAL Maxcine Ham;  Surgeon: [name], MD;  Location: [facility] CATH LAB;  Service: Cardiovascular;  Laterality: N/A;   ABDOMINAL AORTOGRAM W/LOWER EXTREMITY N/A 02/25/2021   Procedure: ABDOMINAL AORTOGRAM W/LOWER EXTREMITY;  Surgeon: Cherre Robins, MD;  Location: [place] CV LAB;  Service: Cardiovascular;  Laterality: N/A;   ABDOMINAL AORTOGRAM W/LOWER EXTREMITY N/A 04/13/2021   Procedure: ABDOMINAL AORTOGRAM W/LOWER EXTREMITY;  Surgeon: Waynetta Sandy, MD;  Location: [place] CV LAB;  Service: Cardiovascular;  Laterality: N/A;   AMPUTATION Right 05/12/2021   Procedure: AMPUTATION ABOVE KNEE RIGHT;  Surgeon: Angelia Mould, MD;  Location: [facility] OR;  Service: Vascular;  Laterality: Right;   APPLICATION OF WOUND VAC Right 04/09/2021   Procedure: APPLICATION OF WOUND VAC;  Surgeon: Cherre Robins, MD;  Location: [place];  Service: Vascular;  Laterality: Right;   BYPASS GRAFT FEMORAL-PERONEAL Right 03/06/2021   Procedure: RIGHT ABOVE KNEE POPLITEAL ARTERY-PERONEAL BYPASS;  Surgeon: Cherre Robins, MD;   Location: [place];  Service: Vascular;  Laterality: Right;   CORONARY ANGIOGRAM  09/21/2012   Procedure: CORONARY ANGIOGRAM;  Surgeon: [name], MD;  Location: [facility] CATH LAB;  Service: Cardiovascular;;   EP IMPLANTABLE DEVICE N/A 06/11/2016   Procedure: ICD Implant;  Surgeon: Will Meredith Leeds, MD;  Location: [place] CV LAB;  Service: Cardiovascular;  Laterality: N/A;   EXTREMITY WIRE/PIN REMOVAL  09/14/2012   Procedure: REMOVAL K-WIRE/PIN EXTREMITY;  Surgeon: [name], MD;  Location: [place];  Service: Orthopedics;  Laterality: Right;  Right Foot   I & D EXTREMITY  09/14/2012   Procedure: IRRIGATION AND DEBRIDEMENT EXTREMITY;  Surgeon: Tennis Must, MD;  Location: [place];  Service: Orthopedics;  Laterality: Right;   INTRAOPERATIVE TRANSESOPHAGEAL ECHOCARDIOGRAM  09/26/2012   Procedure: INTRAOPERATIVE TRANSESOPHAGEAL ECHOCARDIOGRAM;  Surgeon: [name], MD;  Location: [facility] OR;  Service: Open Heart Surgery;  Laterality: N/A;   IR FLUORO GUIDE CV LINE RIGHT  04/20/2021   IR NEPHROSTOMY PLACEMENT LEFT  04/20/2021   IR US GUIDE VASC ACCESS RIGHT  04/20/2021   MITRAL VALVE REPLACEMENT  09/26/2012   Procedure: MITRAL VALVE (MV) REPLACEMENT;  Surgeon: [name], MD;  Location: [place];  Service: Open Heart Surgery;  Laterality: N/A;   RIGHT HEART CATH N/A 08/29/2020   Procedure: RIGHT HEART CATH;  Surgeon: Larey Dresser, MD;  Location: [place] CV LAB;  Service: Cardiovascular;  Laterality: N/A;   RIGHT HEART CATHETERIZATION  09/21/2012   Procedure: RIGHT HEART CATH;  Surgeon: [name], MD;  Location: [facility] CATH LAB;  Service: Cardiovascular;;   RIGHT/LEFT HEART CATH AND CORONARY ANGIOGRAPHY N/A 01/30/2021   Procedure: RIGHT/LEFT HEART CATH AND CORONARY ANGIOGRAPHY;  Surgeon: Larey Dresser, MD;  Location: [place] CV LAB;  Service: Cardiovascular;  Laterality: N/A;   SVT ABLATION N/A 09/03/2020   Procedure: SVT ABLATION;  Surgeon: Constance Haw, MD;  Location: [place]  CV LAB;  Service: Cardiovascular;  Laterality: N/A;   TEE WITHOUT CARDIOVERSION  09/18/2012   Procedure: TRANSESOPHAGEAL ECHOCARDIOGRAM (TEE);  Surgeon: [name], MD;  Location: [facility] ENDOSCOPY;  Service: Cardiovascular;  Laterality: N/A;   WOUND DEBRIDEMENT Right 04/09/2021   Procedure: DEBRIDEMENT RIGHT HEEL WOUND AND PARTIAL FIRST TOE AMPUTATION AND SECOND TOE AMPUTATION. Application of Myriad skin substitute;  Surgeon: Cherre Robins, MD;  Location: [facility] OR;  Service: Vascular;  Laterality: Right;   Social History  reports that he has never smoked. He has never used smokeless tobacco. He reports that he does not drink alcohol and does not use drugs.  Allergies  Allergen Reactions   Geralyn Flash [Fish Allergy] Nausea And Vomiting   Family History  Problem Relation Age of Onset   Hypertension Mother    Hypertension Father    Prior to Admission medications   Medication Sig Start Date End Date Taking? Authorizing Provider  acetaminophen (TYLENOL) 325 MG tablet Take 650 mg by mouth every 6 (six) hours as needed for mild pain or headache.   Yes [provider]  Amino Acids-Protein Hydrolys (FEEDING SUPPLEMENT, PRO-STAT SUGAR FREE 64,) LIQD Take 30 mLs by mouth 3 (three) times daily. Take with 4 oz juice   Yes [provider]  amiodarone (PACERONE) 200 MG tablet  TAKE 1 TABLET BY MOUTH EVERY DAY Patient taking differently: Take 200 mg by mouth every morning. 03/03/21  Yes Bhagat, Bhavinkumar, PA  apixaban (ELIQUIS) 5 MG TABS tablet Take 1 tablet (5 mg total) by mouth every 12 (twelve) hours. 05/22/21  Yes [place] Eric J, DO  atorvastatin (LIPITOR) 40 MG tablet Take 1 tablet (40 mg total) by mouth daily at 6 PM. Patient taking differently: Take 40 mg by mouth at bedtime. 10/06/12  Yes Timmothy Euler, MD  bisacodyl (DULCOLAX) 5 MG EC tablet Take 5 mg by mouth daily as needed (constipation).   Yes [provider]  docusate sodium (COLACE) 100 MG capsule Take 100 mg by mouth  every morning.   Yes [provider]  empagliflozin (JARDIANCE) 10 MG TABS tablet Take 1 tablet (10 mg total) by mouth daily. 11/11/20  Yes Larey Dresser, MD  gabapentin (NEURONTIN) 300 MG capsule Take 300 mg by mouth at bedtime.   Yes [provider]  guaiFENesin (ROBITUSSIN) 100 MG/5ML SOLN Take 5 mLs by mouth every 4 (four) hours as needed for cough or to loosen phlegm.   Yes [provider]  midodrine (PROAMATINE) 10 MG tablet Take 1 tablet (10 mg total) by mouth 3 (three) times daily with meals. 05/22/21  Yes [place] Eric J, DO  pantoprazole (PROTONIX) 40 MG tablet Take 40 mg by mouth every morning. 11/12/14  Yes [provider]  triamcinolone cream (KENALOG) 0.5 % Apply 1 application topically daily as needed (skin rash).  02/15/14  Yes [provider]  feeding supplement (ENSURE ENLIVE / ENSURE PLUS) LIQD Take 237 mLs by mouth 2 (two) times daily between meals. Patient not taking: Reported on 06/06/2021 03/12/21   Gabriel Earing, PA-C   Physical Exam: Vitals:   06/06/21 1742 06/06/21 2025 06/06/21 2200  BP:  (!) 146/71 (!) 143/70  Pulse:  74 72  Resp:  15 17  SpO2:  100% 100%  Weight: 78 kg    Height: 6' (1.829 m)     Constitutional: NAD, calm, comfortable Eyes: PERRL, lids and conjunctivae normal ENMT: Mucous membranes are moist. Posterior pharynx clear of any exudate or lesions. Neck: normal, supple, no masses, no thyromegaly Respiratory: clear to auscultation bilaterally, no wheezing, no crackles. Normal respiratory effort. No accessory muscle use.  Cardiovascular: Regular rate and rhythm, no murmurs / rubs / gallops. No extremity edema. 2+ pedal pulses. No carotid bruits.  Abdomen: Right flank dressing.  No distention.  Bowel sounds positive.  Soft, no tenderness, no masses palpated. No hepatosplenomegaly. Musculoskeletal: Right AKA.  Mild generalized weakness.  No clubbing / cyanosis.  Good ROM, no contractures. Normal muscle tone.   Skin: no acute rashes, lesions, ulcers on very limited dermatological examination. Neurologic: CN 2-12 grossly intact. Sensation intact, DTR normal. Strength 5/5 in all 4.  Psychiatric: Normal judgment and insight. Alert and oriented x 3. Normal mood.   Labs on Admission: I have personally reviewed following labs and imaging studies  CBC: Recent Labs  Lab 06/06/21 1925  WBC 7.8  NEUTROABS 4.4  HGB 9.7*  HCT 33.7*  MCV 78.0*  PLT 588   Basic Metabolic Panel: Recent Labs  Lab 06/06/21 1925  NA 142  K 4.1  CL 108  CO2 28  GLUCOSE 100*  BUN 29*  CREATININE 1.16  CALCIUM 8.9   GFR: Estimated Creatinine Clearance: 58.5 mL/min (by C-G formula based on SCr of 1.16 mg/dL).  Liver Function Tests: Recent Labs  Lab 06/06/21 1925  AST 28  ALT 16  ALKPHOS 115  BILITOT 1.0  PROT 6.8  ALBUMIN 2.8*   Radiological Exams on Admission: CT ABDOMEN PELVIS WO CONTRAST  Result Date: 06/06/2021 CLINICAL DATA:  Follow-up of retroperitoneal hematoma. Nephrostomy tube fell out today. EXAM: CT ABDOMEN AND PELVIS WITHOUT CONTRAST TECHNIQUE: Multidetector CT imaging of the abdomen and pelvis was performed following the standard protocol without IV contrast. COMPARISON:  04/24/2021 FINDINGS: Lower chest: Cylindrical bronchiectasis and fibrosis in the lung bases. Three cavitated nodular opacities in the right lower lung posteriorly, largest measuring about 12 mm in diameter. These were not definitely present on the previous study and are likely inflammatory. Follow-up to resolution is recommended. Cardiac enlargement. Postoperative changes in the mediastinum. Hepatobiliary: No focal liver lesions are identified on noncontrast imaging. Cholelithiasis with small stones in the gallbladder. No inflammatory changes. No bile duct dilatation. Pancreas: Unremarkable. No pancreatic ductal dilatation or surrounding inflammatory changes. Spleen: Normal in size without focal abnormality. Adrenals/Urinary Tract:  No adrenal gland nodules. Left nephrostomy tube has been removed in the interval. Mild left hydronephrosis and hydroureter to the level of the distal ureter, likely a degree of obstruction due to extrinsic compression. Right kidney and ureter are unremarkable. Mild bladder wall thickening may indicate cystitis or outlet obstruction. Stomach/Bowel: Stomach, small bowel, and colon are not abnormally distended. Stool throughout the colon. Prominent stool in the rectum. Appendix is normal. Vascular/Lymphatic: Diffuse aortic calcification. No aneurysm. No significant lymphadenopathy. Reproductive: Prostate gland is not enlarged. Other: Large left iliopsoas collection, likely representing organizing hematoma. Collection today measures up to about 5.5 x 11.3 cm. There is mild retraction of the hematoma since previous study. No free air or free fluid in the abdomen. Musculoskeletal: Degenerative changes in the spine and hips. Compression deformities at L2, L3, and L5 levels. No progression since previous study. IMPRESSION: 1. Left retroperitoneal organizing hematoma appears minimally decreased in size since prior study. 2. Left nephrostomy tube is out. Mild progression of left hydronephrosis. 3. Diffuse aortic atherosclerosis. 4. Prominent bronchiectasis and fibrosis in the lung bases. Small cavitary nodules in the right lung base periphery, new since prior study, likely inflammatory. Short-term follow-up suggested in 3 months. 5. Cholelithiasis without evidence of acute cholecystitis. Electronically Signed   By: Lucienne Capers M.D.   On: 06/06/2021 19:16    05/05/2021 echocardiogram complete IMPRESSIONS:   1. Left ventricular ejection fraction, by estimation, is 25 to 30%. The  left ventricle has severely decreased function. The left ventricle  demonstrates global hypokinesis. The left ventricular internal cavity size  was mildly dilated. Left ventricular  diastolic parameters are indeterminate.   2. Device  leads in RV/RA. Right ventricular systolic function is  moderately reduced. The right ventricular size is moderately enlarged.  There is moderately elevated pulmonary artery systolic pressure.   3. Left atrial size was moderately dilated.   4. Right atrial size was mildly dilated.   5. 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 The mitral valve has been  repaired/replaced. No evidence of mitral valve regurgitation. No evidence  of mitral stenosis. There is a 27 mm [facility] bioprosthetic valve  present in the mitral position.  Procedure Date: 09/26/2012.   6. Tricuspid valve regurgitation is moderate.   7. The aortic valve is calcified. There is moderate calcification of the  aortic valve. Aortic valve regurgitation is not visualized. Mild to  moderate aortic valve sclerosis/calcification is present, without any  evidence of aortic stenosis.   8. Aortic dilatation noted. There is mild  dilatation of the aortic root,  measuring 41 mm.   9. The inferior vena cava is normal in size with greater than 50%  respiratory variability, suggesting right atrial pressure of 3 mmHg.   EKG: Independently reviewed.   Assessment/Plan Principal Problem:   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HCC) Observation/telemetry. Keep n.p.o. after midnight. Analgesics as needed. Antiemetics as needed. Urology involvement greatly appreciated. IR will see in AM.  Active Problems:   Multiple pulmonary nodules Radiology recommends a follow-up in 3 months.    Chronic systolic heart failure (HCC) No signs of decompensation. Fluid and sodium restriction. He is currently on midodrine Unable to tolerate ACE-I, BB or diuretics.    Type 2 diabetes mellitus (HCC) N.p.o. at the moment. CBG monitoring every 6 hours.    SVT (supraventricular tachycardia)  long RP Continue amiodarone 200 mg p.o. daily. Monitor heart rate and electrolytes.    PAD (peripheral artery disease) (HCC) On atorvastatin and  anticoagulation.    Normocytic anemia Monitor hematocrit and hemoglobin. Transfuse as needed.    DVT of left external iliac vein 04/2021 On apixaban.    Pulmonary emboli 04/2021 On anticoagulation.     DVT prophylaxis: On apixaban. Code Status:   Full code. Family Communication:   Disposition Plan:   Patient is from:  [facility].  Anticipated DC to:  [facility].  Anticipated DC date:  06/07/2021 or 06/08/2021.  Anticipated DC barriers: Clinical status.  Consults called:  Dr. Gloriann Loan (urology) and Dr. Anselm Pancoast (IR). Admission status:  Observation/telemetry.   Severity of Illness: High severity after the patient accidentally got his nephrostomy tube pulled out.  The patient will need to remain in the hospital for nephrostomy tube reinsertion in the morning.  Reubin Milan MD [facility]  How to contact the [facility] Attending or Consulting provider [place] or covering provider during after hours [place], for this patient?   Check the care team in [facility] and look for a) attending/consulting [facility] provider listed and b) the [facility] team listed Log into [email or web address] and use [place]'s universal password to access. If you do not have the password, please contact the hospital operator. Locate the [facility] provider you are looking for under [facility] and page to a number that you can be directly reached. If you still have difficulty reaching the provider, please page the [facility] (Director on Call) for the Hospitalists listed on amion for assistance.  06/06/2021, 11:01 PM   This document was prepared using Dragon voice recognition software and may contain some unintended transcription errors.

## 2021-06-06 NOTE — Progress Notes (Signed)
Spoke with Dr. Anselm Pancoast. Likely to replace left PCN tomorrow. Make NPO at [facility]

## 2021-06-06 NOTE — ED Provider Notes (Signed)
[facility] DEPT Provider Note   CSN: [id number] Arrival date & time: 06/06/21  1729     History No chief complaint on file.   Ryan [name] is a 75 y.o. male.  Patient presents with complaint of nephrostomy tube dislodgment.  He presents from a nursing home, states that as he was being moved the nephrostomy tube was caught and was yanked completely out.  Otherwise denies any pain.  This occurred earlier today.  Patient denies any fevers or cough or vomiting or diarrhea.      Past Medical History:  Diagnosis Date   AICD (automatic cardioverter/defibrillator) present    Arthritis    Atrial fibrillation ([place])    Cardiomyopathy, dilated (HCC)    CHF (congestive heart failure) ([place])    Diabetes mellitus without complication ([place])    DVT of left external iliac vein 04/2021 06/06/2021   Endocarditis    Hypertension    Peripheral vascular disease ([place])    Pulmonary emboli 04/2021 06/06/2021   PVC's (premature ventricular contractions)    SVT (supraventricular tachycardia)  long RP     Patient Active Problem List   Diagnosis Date Noted   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[place]) 06/06/2021   Normocytic anemia 06/06/2021   DVT of left external iliac vein 04/2021 06/06/2021   Pulmonary emboli 04/2021 06/06/2021   Pressure injury of skin 04/22/2021   Popliteal artery occlusion, 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   PAD (peripheral artery disease) ([place]) 03/06/2021   Acute on chronic combined systolic and diastolic CHF (congestive heart failure) ([place]) 09/06/2020   CHF (congestive heart failure) ([place]) 08/26/2020   Acute combined systolic and diastolic CHF, NYHA class 3 ([place])    Stage 3 chronic kidney disease ([place])    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   PVC's (premature ventricular contractions) 06/12/2016   Junctional tachycardia ([place]) 09/16/2015   SVT (supraventricular tachycardia)  long RP    Atherosclerosis of native arteries of the  extremities with ulceration ([place]) 10/23/2012   Essential hypertension 09/29/2012    Class: Chronic   Type 2 diabetes mellitus ([place]) 09/28/2012   Status post mitral valve replacement with bioprosthetic valve 09/19/2012    Class: Acute   Acute pericarditis 09/16/2012    Class: Acute   Chronic systolic heart failure ([place]) 09/16/2012    Class: Acute   Peripheral vascular disease ([place]) 09/15/2012   Acute ischemic stroke ([place]) 09/15/2012    Past Surgical History:  Procedure Laterality Date   ABDOMINAL AORTAGRAM N/A 10/03/2012   Procedure: ABDOMINAL Maxcine Ham;  Surgeon: [name], MD;  Location: [facility] CATH LAB;  Service: Cardiovascular;  Laterality: N/A;   ABDOMINAL AORTOGRAM W/LOWER EXTREMITY N/A 02/25/2021   Procedure: ABDOMINAL AORTOGRAM W/LOWER EXTREMITY;  Surgeon: Cherre Robins, MD;  Location: [facility] CV LAB;  Service: Cardiovascular;  Laterality: N/A;   ABDOMINAL AORTOGRAM W/LOWER EXTREMITY N/A 04/13/2021   Procedure: ABDOMINAL AORTOGRAM W/LOWER EXTREMITY;  Surgeon: Waynetta Sandy, MD;  Location: [place] CV LAB;  Service: Cardiovascular;  Laterality: N/A;   AMPUTATION Right 05/12/2021   Procedure: AMPUTATION ABOVE KNEE RIGHT;  Surgeon: Angelia Mould, MD;  Location: [facility];  Service: Vascular;  Laterality: Right;   APPLICATION OF WOUND VAC Right 04/09/2021   Procedure: APPLICATION OF WOUND VAC;  Surgeon: Cherre Robins, MD;  Location: [place];  Service: Vascular;  Laterality: Right;   BYPASS GRAFT FEMORAL-PERONEAL Right 03/06/2021   Procedure: RIGHT ABOVE KNEE POPLITEAL ARTERY-PERONEAL BYPASS;  Surgeon: Cherre Robins, MD;  Location: [facility]  OR;  Service: Vascular;  Laterality: Right;   CORONARY ANGIOGRAM  09/21/2012   Procedure: CORONARY ANGIOGRAM;  Surgeon: [name], MD;  Location: [facility] CATH LAB;  Service: Cardiovascular;;   EP IMPLANTABLE DEVICE N/A 06/11/2016   Procedure: ICD Implant;  Surgeon: Will Meredith Leeds, MD;  Location: [place] CV LAB;   Service: Cardiovascular;  Laterality: N/A;   EXTREMITY WIRE/PIN REMOVAL  09/14/2012   Procedure: REMOVAL K-WIRE/PIN EXTREMITY;  Surgeon: [name], MD;  Location: [place];  Service: Orthopedics;  Laterality: Right;  Right Foot   I & D EXTREMITY  09/14/2012   Procedure: IRRIGATION AND DEBRIDEMENT EXTREMITY;  Surgeon: Tennis Must, MD;  Location: [facility];  Service: Orthopedics;  Laterality: Right;   INTRAOPERATIVE TRANSESOPHAGEAL ECHOCARDIOGRAM  09/26/2012   Procedure: INTRAOPERATIVE TRANSESOPHAGEAL ECHOCARDIOGRAM;  Surgeon: [name], MD;  Location: [facility] OR;  Service: Open Heart Surgery;  Laterality: N/A;   IR FLUORO GUIDE CV LINE RIGHT  04/20/2021   IR NEPHROSTOMY PLACEMENT LEFT  04/20/2021   IR US GUIDE VASC ACCESS RIGHT  04/20/2021   MITRAL VALVE REPLACEMENT  09/26/2012   Procedure: MITRAL VALVE (MV) REPLACEMENT;  Surgeon: [name], MD;  Location: [place];  Service: Open Heart Surgery;  Laterality: N/A;   RIGHT HEART CATH N/A 08/29/2020   Procedure: RIGHT HEART CATH;  Surgeon: Larey Dresser, MD;  Location: [place] CV LAB;  Service: Cardiovascular;  Laterality: N/A;   RIGHT HEART CATHETERIZATION  09/21/2012   Procedure: RIGHT HEART CATH;  Surgeon: [name], MD;  Location: [facility] CATH LAB;  Service: Cardiovascular;;   RIGHT/LEFT HEART CATH AND CORONARY ANGIOGRAPHY N/A 01/30/2021   Procedure: RIGHT/LEFT HEART CATH AND CORONARY ANGIOGRAPHY;  Surgeon: Larey Dresser, MD;  Location: [place] CV LAB;  Service: Cardiovascular;  Laterality: N/A;   SVT ABLATION N/A 09/03/2020   Procedure: SVT ABLATION;  Surgeon: Constance Haw, MD;  Location: [place] CV LAB;  Service: Cardiovascular;  Laterality: N/A;   TEE WITHOUT CARDIOVERSION  09/18/2012   Procedure: TRANSESOPHAGEAL ECHOCARDIOGRAM (TEE);  Surgeon: [name], MD;  Location: [facility] ENDOSCOPY;  Service: Cardiovascular;  Laterality: N/A;   WOUND DEBRIDEMENT Right 04/09/2021   Procedure: DEBRIDEMENT RIGHT HEEL WOUND AND PARTIAL FIRST  TOE AMPUTATION AND SECOND TOE AMPUTATION. Application of Myriad skin substitute;  Surgeon: Cherre Robins, MD;  Location: [facility] OR;  Service: Vascular;  Laterality: Right;       Family History  Problem Relation Age of Onset   Hypertension Mother    Hypertension Father     Social History   Tobacco Use   Smoking status: Never   Smokeless tobacco: Never  Vaping Use   Vaping Use: Never used  Substance Use Topics   Alcohol use: No    Alcohol/week: 0.0 standard drinks   Drug use: No    Home Medications Prior to Admission medications   Medication Sig Start Date End Date Taking? Authorizing Provider  acetaminophen (TYLENOL) 325 MG tablet Take 650 mg by mouth every 6 (six) hours as needed for mild pain or headache.   Yes [provider]  Amino Acids-Protein Hydrolys (FEEDING SUPPLEMENT, PRO-STAT SUGAR FREE 64,) LIQD Take 30 mLs by mouth 3 (three) times daily. Take with 4 oz juice   Yes [provider]  amiodarone (PACERONE) 200 MG tablet TAKE 1 TABLET BY MOUTH EVERY DAY Patient taking differently: Take 200 mg by mouth every morning. 03/03/21  Yes Bhagat, Crista Luria, PA  apixaban (ELIQUIS) 5  MG TABS tablet Take 1 tablet (5 mg total) by mouth every 12 (twelve) hours. 05/22/21  Yes [place] Eric J, DO  atorvastatin (LIPITOR) 40 MG tablet Take 1 tablet (40 mg total) by mouth daily at 6 PM. Patient taking differently: Take 40 mg by mouth at bedtime. 10/06/12  Yes Timmothy Euler, MD  bisacodyl (DULCOLAX) 5 MG EC tablet Take 5 mg by mouth daily as needed (constipation).   Yes [provider]  docusate sodium (COLACE) 100 MG capsule Take 100 mg by mouth every morning.   Yes [provider]  empagliflozin (JARDIANCE) 10 MG TABS tablet Take 1 tablet (10 mg total) by mouth daily. 11/11/20  Yes Larey Dresser, MD  gabapentin (NEURONTIN) 300 MG capsule Take 300 mg by mouth at bedtime.   Yes [provider]  guaiFENesin (ROBITUSSIN) 100 MG/5ML SOLN Take 5  mLs by mouth every 4 (four) hours as needed for cough or to loosen phlegm.   Yes [provider]  midodrine (PROAMATINE) 10 MG tablet Take 1 tablet (10 mg total) by mouth 3 (three) times daily with meals. 05/22/21  Yes [place] Eric J, DO  pantoprazole (PROTONIX) 40 MG tablet Take 40 mg by mouth every morning. 11/12/14  Yes [provider]  triamcinolone cream (KENALOG) 0.5 % Apply 1 application topically daily as needed (skin rash).  02/15/14  Yes [provider]  feeding supplement (ENSURE ENLIVE / ENSURE PLUS) LIQD Take 237 mLs by mouth 2 (two) times daily between meals. Patient not taking: Reported on 06/06/2021 03/12/21   Gabriel Earing, PA-C    Allergies    [name] allergy]  Review of Systems   Review of Systems  Constitutional:  Negative for fever.  HENT:  Negative for ear pain and sore throat.   Eyes:  Negative for pain.  Respiratory:  Negative for cough.   Cardiovascular:  Negative for chest pain.  Gastrointestinal:  Negative for abdominal pain.  Genitourinary:  Negative for flank pain.  Musculoskeletal:  Negative for back pain.  Skin:  Negative for color change and rash.  Neurological:  Negative for syncope.  All other systems reviewed and are negative.  Physical Exam Updated Vital Signs BP (!) 143/70   Pulse 72   Resp 17   Ht 6' (1.829 m)   Wt 78 kg   SpO2 100%   BMI 23.33 kg/m   Physical Exam Constitutional:      Appearance: He is well-developed.  HENT:     Head: Normocephalic.     Nose: Nose normal.  Eyes:     Extraocular Movements: Extraocular movements intact.  Cardiovascular:     Rate and Rhythm: Normal rate.  Pulmonary:     Effort: Pulmonary effort is normal.  Abdominal:     Tenderness: There is no right CVA tenderness or left CVA tenderness.  Skin:    Coloration: Skin is not jaundiced.  Neurological:     Mental Status: He is alert. Mental status is at baseline.    ED Results / Procedures / Treatments   Labs (all  labs ordered are listed, but only abnormal results are displayed) Labs Reviewed  CBC WITH DIFFERENTIAL/PLATELET - Abnormal; Notable for the following components:      Result Value   Hemoglobin 9.7 (*)    HCT 33.7 (*)    MCV 78.0 (*)    MCH 22.5 (*)    MCHC 28.8 (*)    RDW 21.0 (*)    Monocytes Absolute 1.2 (*)  All other components within normal limits  COMPREHENSIVE METABOLIC PANEL - Abnormal; Notable for the following components:   Glucose, Bld 100 (*)    BUN 29 (*)    Albumin 2.8 (*)    All other components within normal limits  PROTIME-INR - Abnormal; Notable for the following components:   Prothrombin Time 20.4 (*)    INR 1.8 (*)    All other components within normal limits  RESP PANEL BY RT-PCR (FLU A&B, COVID) ARPGX2    EKG None  Radiology CT ABDOMEN PELVIS WO CONTRAST  Result Date: 06/06/2021 CLINICAL DATA:  Follow-up of retroperitoneal hematoma. Nephrostomy tube fell out today. EXAM: CT ABDOMEN AND PELVIS WITHOUT CONTRAST TECHNIQUE: Multidetector CT imaging of the abdomen and pelvis was performed following the standard protocol without IV contrast. COMPARISON:  04/24/2021 FINDINGS: Lower chest: Cylindrical bronchiectasis and fibrosis in the lung bases. Three cavitated nodular opacities in the right lower lung posteriorly, largest measuring about 12 mm in diameter. These were not definitely present on the previous study and are likely inflammatory. Follow-up to resolution is recommended. Cardiac enlargement. Postoperative changes in the mediastinum. Hepatobiliary: No focal liver lesions are identified on noncontrast imaging. Cholelithiasis with small stones in the gallbladder. No inflammatory changes. No bile duct dilatation. Pancreas: Unremarkable. No pancreatic ductal dilatation or surrounding inflammatory changes. Spleen: Normal in size without focal abnormality. Adrenals/Urinary Tract: No adrenal gland nodules. Left nephrostomy tube has been removed in the interval. Mild  left hydronephrosis and hydroureter to the level of the distal ureter, likely a degree of obstruction due to extrinsic compression. Right kidney and ureter are unremarkable. Mild bladder wall thickening may indicate cystitis or outlet obstruction. Stomach/Bowel: Stomach, small bowel, and colon are not abnormally distended. Stool throughout the colon. Prominent stool in the rectum. Appendix is normal. Vascular/Lymphatic: Diffuse aortic calcification. No aneurysm. No significant lymphadenopathy. Reproductive: Prostate gland is not enlarged. Other: Large left iliopsoas collection, likely representing organizing hematoma. Collection today measures up to about 5.5 x 11.3 cm. There is mild retraction of the hematoma since previous study. No free air or free fluid in the abdomen. Musculoskeletal: Degenerative changes in the spine and hips. Compression deformities at L2, L3, and L5 levels. No progression since previous study. IMPRESSION: 1. Left retroperitoneal organizing hematoma appears minimally decreased in size since prior study. 2. Left nephrostomy tube is out. Mild progression of left hydronephrosis. 3. Diffuse aortic atherosclerosis. 4. Prominent bronchiectasis and fibrosis in the lung bases. Small cavitary nodules in the right lung base periphery, new since prior study, likely inflammatory. Short-term follow-up suggested in 3 months. 5. Cholelithiasis without evidence of acute cholecystitis. Electronically Signed   By: Lucienne Capers M.D.   On: 06/06/2021 19:16    Procedures Procedures   Medications Ordered in ED Medications  acetaminophen (TYLENOL) tablet 650 mg (has no administration in time range)    Or  acetaminophen (TYLENOL) suppository 650 mg (has no administration in time range)  prochlorperazine (COMPAZINE) injection 5 mg (has no administration in time range)  feeding supplement (PRO-STAT SUGAR FREE 64) liquid 30 mL (has no administration in time range)  amiodarone (PACERONE) tablet 200 mg  (has no administration in time range)  apixaban (ELIQUIS) tablet 5 mg (has no administration in time range)  atorvastatin (LIPITOR) tablet 40 mg (has no administration in time range)  bisacodyl (DULCOLAX) EC tablet 5 mg (has no administration in time range)  docusate sodium (COLACE) capsule 100 mg (has no administration in time range)  empagliflozin (JARDIANCE) tablet 10 mg (has  no administration in time range)  gabapentin (NEURONTIN) capsule 300 mg (has no administration in time range)  guaiFENesin (ROBITUSSIN) 100 MG/5ML solution 100 mg (has no administration in time range)  midodrine (PROAMATINE) tablet 10 mg (has no administration in time range)  pantoprazole (PROTONIX) EC tablet 40 mg (has no administration in time range)  (feeding supplement) PROSource Plus liquid 30 mL (has no administration in time range)    ED Course  I have reviewed the triage vital signs and the nursing notes.  Pertinent labs & imaging results that were available during my care of the patient were reviewed by me and considered in my medical decision making (see chart for details).    MDM Rules/Calculators/A&P                           Labs are unremarkable.  CT imaging shows persistent hydronephrosis on the left with persistent retroperitoneal hematoma.  Case discussed with urologist on-call Dr.Bell, follow patient and arrange for new nephrostomy tube to be placed.  Medicine consulted for admission.   Final Clinical Impression(s) / ED Diagnoses Final diagnoses:  Hydronephrosis, unspecified hydronephrosis type  Retroperitoneal hematoma  Nephrostomy tube displaced [facility])    Rx / DC Orders ED Discharge Orders     None        Luna Fuse, MD 06/06/21 2304

## 2021-06-07 ENCOUNTER — Observation Stay (HOSPITAL_COMMUNITY): Payer: Medicare HMO

## 2021-06-07 ENCOUNTER — Encounter (HOSPITAL_COMMUNITY): Payer: Self-pay | Admitting: Internal Medicine

## 2021-06-07 DIAGNOSIS — I5022 Chronic systolic (congestive) heart failure: Secondary | ICD-10-CM

## 2021-06-07 DIAGNOSIS — I82422 Acute embolism and thrombosis of left iliac vein: Secondary | ICD-10-CM

## 2021-06-07 DIAGNOSIS — N131 Hydronephrosis with ureteral stricture, not elsewhere classified: Secondary | ICD-10-CM | POA: Diagnosis not present

## 2021-06-07 DIAGNOSIS — I471 Supraventricular tachycardia: Secondary | ICD-10-CM | POA: Diagnosis not present

## 2021-06-07 DIAGNOSIS — N99528 Other complication of other external stoma of urinary tract: Secondary | ICD-10-CM | POA: Diagnosis not present

## 2021-06-07 DIAGNOSIS — I2699 Other pulmonary embolism without acute cor pulmonale: Secondary | ICD-10-CM

## 2021-06-07 DIAGNOSIS — E1159 Type 2 diabetes mellitus with other circulatory complications: Secondary | ICD-10-CM

## 2021-06-07 DIAGNOSIS — K661 Hemoperitoneum: Secondary | ICD-10-CM | POA: Diagnosis not present

## 2021-06-07 DIAGNOSIS — I82429 Acute embolism and thrombosis of unspecified iliac vein: Secondary | ICD-10-CM

## 2021-06-07 DIAGNOSIS — D649 Anemia, unspecified: Secondary | ICD-10-CM | POA: Diagnosis not present

## 2021-06-07 DIAGNOSIS — T83022A Displacement of nephrostomy catheter, initial encounter: Secondary | ICD-10-CM | POA: Diagnosis not present

## 2021-06-07 DIAGNOSIS — R918 Other nonspecific abnormal finding of lung field: Secondary | ICD-10-CM | POA: Diagnosis not present

## 2021-06-07 DIAGNOSIS — I739 Peripheral vascular disease, unspecified: Secondary | ICD-10-CM | POA: Diagnosis not present

## 2021-06-07 HISTORY — PX: IR NEPHROSTOMY EXCHANGE LEFT: IMG6069

## 2021-06-07 LAB — CBG MONITORING, ED: Glucose-Capillary: 90 mg/dL (ref 70–99)

## 2021-06-07 LAB — GLUCOSE, CAPILLARY
Glucose-Capillary: 75 mg/dL (ref 70–99)
Glucose-Capillary: 91 mg/dL (ref 70–99)
Glucose-Capillary: 103 mg/dL — ABNORMAL HIGH (ref 70–99)

## 2021-06-07 MED ORDER — OXYCODONE HCL 5 MG PO TABS
5.0000 mg | ORAL_TABLET | Freq: Once | ORAL | Status: AC
Start: 2021-06-07 — End: 2021-06-07
  Administered 2021-06-07: 5 mg via ORAL
  Filled 2021-06-07: qty 1

## 2021-06-07 MED ORDER — LIDOCAINE HCL 1 % IJ SOLN
INTRAMUSCULAR | Status: AC
  Filled 2021-06-07: qty 20

## 2021-06-07 MED ORDER — IOHEXOL 300 MG/ML  SOLN
100.0000 mL | Freq: Once | INTRAMUSCULAR | Status: AC | PRN
  Administered 2021-06-07: 15 mL

## 2021-06-07 NOTE — Procedures (Signed)
Interventional Radiology Procedure:   Indications: Dislodged nephrostomy   Procedure: Left nephrostomy replacement  Findings: New 10 Fr drain in left renal pelvis.  Left ureter is patent.  Complications: No immediate complications noted.     EBL: Minimal  Plan: Discuss capping trial with Urology.   Aya Geisel R. Anselm Pancoast, MD  Pager: [phone]

## 2021-06-07 NOTE — Care Management Obs Status (Signed)
[place] NOTIFICATION   Patient Details  Name: ZAIDYN DUPES MRN: [id number] Date of Birth: December 20, 1945   Medicare Observation Status Notification Given:  Yes    MahabirJuliann Pulse, RN 06/07/2021, 3:04 PM

## 2021-06-07 NOTE — TOC Transition Note (Addendum)
Transition of Care [facility]) - CM/SW Discharge Note   Patient Details  Name: Ryan [name] MRN: [id number] Date of Birth: [birth year]  [facility]) CM/SW Contact:  Dessa Phi, RN Phone Number: 06/07/2021, 2:55 PM   Clinical Narrative: d/c back to [facility] rep Juliann Pulse accepted. [facility] for transport([facility] unavailable after hrs).Await rm#,tel# for nsg report.   3:15p-going to rm#[id number], nsg call report tel#[phone]. [facility] called.thnx    Final next level of care: Skilled Nursing Facility Barriers to Discharge: No Barriers Identified   Patient Goals and CMS Choice Patient states their goals for this hospitalization and ongoing recovery are:: return back to [facility]      Discharge Placement                Patient to be transferred to facility by: [facility] Name of family member notified: [name] [phone] Patient and family notified of of transfer: 06/07/21  Discharge Plan and Services                                     Social Determinants of Health (SDOH) Interventions     Readmission Risk Interventions Readmission Risk Prevention Plan 04/15/2021  Home Care Screening Complete  Medication Review (RN CM) Complete  Some recent data might be hidden

## 2021-06-07 NOTE — Discharge Instructions (Addendum)
Hydronephrosis is the swelling of one or both kidneys due to a blockage that stops urine from flowing out of the body. Kidneys filter waste from the blood and produce urine. This condition can lead to kidney failure and may become life-threatening if not treated promptly. What are the causes? In infants and children, common causes include problems that occur when a baby is developing in the womb. These can include problems in the kidneys or in the tubes that drain urine into the bladder (ureters). In adults, common causes include: Kidney stones. Pregnancy. A tumor or cyst in the abdomen or pelvis. An enlarged prostate gland. Other causes include: Bladder infection. Scar tissue from a previous surgery or injury. A blood clot. Cancer of the prostate, bladder, uterus, ovary, or colon. What are the signs or symptoms? Symptoms of this condition include: Pain or discomfort in your side (flank) or abdomen. Swelling in your abdomen. Nausea and vomiting. Fever. Pain when passing urine. Feelings of urgency when you need to urinate. Urinating more often than normal. In some cases, you may not have any symptoms. How is this diagnosed? This condition may be diagnosed based on: Your symptoms and medical history. A physical exam. Blood and urine tests. Imaging tests, such as an ultrasound, CT scan, or MRI. A procedure to look at your urinary tract and bladder by inserting a scope into the urethra (cystoscopy). How is this treated? Treatment for this condition depends on where the blockage is, how long it has been there, and what caused it. The goal of treatment is to remove the blockage. Treatment may include: Antibiotic medicines to treat or prevent infection. A procedure to place a small, thin tube (stent) into a blocked ureter. The stent will keep the ureter open so that urine can drain through it. A nonsurgical procedure that crushes kidney stones with shock waves (extracorporeal shock wave  lithotripsy). If kidney failure occurs, treatment may include dialysis or a kidney transplant. Follow these instructions at home:  Take over-the-counter and prescription medicines only as told by your health care provider. If you were prescribed an antibiotic medicine, take it exactly as told by your health care provider. Do not stop taking the antibiotic even if you start to feel better. Rest and return to your normal activities as told by your health care provider. Ask your health care provider what activities are safe for you. Drink enough fluid to keep your urine pale yellow. Keep all follow-up visits. This is important. Contact a health care provider if: You continue to have symptoms after treatment. You develop new symptoms. Your urine becomes cloudy or bloody. You have a fever. Get help right away if: You have severe flank or abdominal pain. You cannot drink fluids without vomiting. Summary Hydronephrosis is the swelling of one or both kidneys due to a blockage that stops urine from flowing out of the body. Hydronephrosis can lead to kidney failure and may become life-threatening if not treated promptly. The goal of treatment is to remove the blockage. It may include a procedure to insert a stent into a blocked ureter, a procedure to break up kidney stones, or taking antibiotic medicines. Follow your health care provider's instructions for taking care of yourself at home, including instructions about drinking fluids, taking medicines, and limiting activities. This information is not intended to replace advice given to you by your health care provider. Make sure you discuss any questions you have with your health care provider. Document Revised: 12/25/2019 Document Reviewed: 12/25/2019

## 2021-06-07 NOTE — Progress Notes (Signed)
Patient discharged home. Patient left via stretcher, escorted by [employer] staff to [facility] (SNF). Patient tolerated transfer well, IV removed prior.

## 2021-06-07 NOTE — Progress Notes (Signed)
Report called to [facility] from [employer]. No ETA on pt pick up yet.

## 2021-06-07 NOTE — NC FL2 (Signed)
[place] LEVEL OF CARE SCREENING TOOL     IDENTIFICATION  Patient Name: Ryan [name] Birthdate: 05/16/1946 Sex: male Admission Date (Current Location): 06/06/2021  [facility] and [place] Number:  Herbalist and Address:  [facility],  [place], [place]      Provider Number: [id number]  Attending Physician Name and Address:  Tawni Millers,*  Relative Name and Phone Number:  [name] dtr n law [phone]    Current Level of Care: Hospital Recommended Level of Care: [place] Prior Approval Number:    Date Approved/Denied:   PASRR Number: [id number]  Discharge Plan: SNF    Current Diagnoses: Patient Active Problem List   Diagnosis Date Noted   Multiple pulmonary nodules [id number]   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[place]) 06/06/2021   Normocytic anemia 06/06/2021   DVT of left external iliac vein 04/2021 06/06/2021   Pulmonary emboli 04/2021 06/06/2021   Pressure injury of skin 04/22/2021   Popliteal artery occlusion, 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/06/2021   PAD (peripheral artery disease) ([place]) 03/06/2021   Acute on chronic combined systolic and diastolic CHF (congestive heart failure) ([place]) 09/06/2020   CHF (congestive heart failure) ([place]) 08/26/2020   Acute combined systolic and diastolic CHF, NYHA class 3 ([place])    Stage 3 chronic kidney disease ([place])    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   PVC's (premature ventricular contractions) 06/12/2016   Junctional tachycardia ([place]) 09/16/2015   SVT (supraventricular tachycardia)  long RP    Atherosclerosis of native arteries of the extremities with ulceration ([place]) 10/23/2012   Essential hypertension 09/29/2012   Type 2 diabetes mellitus ([facility]) 09/28/2012   Status post mitral valve replacement with bioprosthetic valve 09/19/2012   Acute pericarditis [id number]   Chronic systolic heart failure ([place]) 09/16/2012   Peripheral vascular  disease ([place]) 09/15/2012   Acute ischemic stroke ([place]) 09/15/2012    Orientation RESPIRATION BLADDER Height & Weight     Self, Time, Situation, Place  Normal Continent Weight: 78 kg Height:  6' (182.9 cm)  BEHAVIORAL SYMPTOMS/MOOD NEUROLOGICAL BOWEL NUTRITION STATUS      Continent Diet (CHO MOD)  AMBULATORY STATUS COMMUNICATION OF NEEDS Skin   Limited Assist Verbally PU Stage and Appropriate Care (Sacrum/heel/ toe wounds)                       Personal Care Assistance Level of Assistance  Bathing, Feeding, Dressing Bathing Assistance: Limited assistance Feeding assistance: Limited assistance Dressing Assistance: Limited assistance     Functional Limitations Info  Sight, Hearing, Speech Sight Info: Impaired (eyeglasses) Hearing Info: Adequate Speech Info: Impaired (dentures top/bottom)    [place]  PT (By licensed PT), OT (By licensed OT)     PT Frequency:  (5x week) OT Frequency:  (5x week)            Contractures Contractures Info: Not present    Additional Factors Info  Code Status, Allergies Code Status Info:  (Full) Allergies Info:  Geralyn Flash)           Current Medications (06/07/2021):  This is the current hospital active medication list Current Facility-Administered Medications  Medication Dose Route Frequency Provider Last Rate Last Admin   (feeding supplement) PROSource Plus liquid 30 mL  30 mL Oral TID BM Reubin Milan, MD   30 mL at 06/07/21 1114   acetaminophen (TYLENOL) tablet 650 mg  650 mg Oral Q6H PRN Reubin Milan,  MD       Or   acetaminophen (TYLENOL) suppository 650 mg  650 mg Rectal Q6H PRN Reubin Milan, MD       amiodarone (PACERONE) tablet 200 mg  200 mg Oral q morning Reubin Milan, MD   200 mg at 06/07/21 1113   atorvastatin (LIPITOR) tablet 40 mg  40 mg Oral QHS Reubin Milan, MD   40 mg at 06/06/21 2305   bisacodyl (DULCOLAX) EC tablet 5 mg  5 mg Oral Daily PRN Reubin Milan,  MD       docusate sodium (COLACE) capsule 100 mg  100 mg Oral q morning Reubin Milan, MD   100 mg at 06/07/21 1113   empagliflozin (JARDIANCE) tablet 10 mg  10 mg Oral Daily Reubin Milan, MD   10 mg at 06/07/21 1113   gabapentin (NEURONTIN) capsule 300 mg  300 mg Oral QHS Reubin Milan, MD   300 mg at 06/06/21 2306   guaiFENesin (ROBITUSSIN) 100 MG/5ML solution 100 mg  5 mL Oral Q4H PRN Reubin Milan, MD       lidocaine (XYLOCAINE) 1 % (with pres) injection            midodrine (PROAMATINE) tablet 10 mg  10 mg Oral TID WC Reubin Milan, MD   10 mg at 06/07/21 1113   pantoprazole (PROTONIX) EC tablet 40 mg  40 mg Oral q morning Reubin Milan, MD   40 mg at 06/07/21 1113   prochlorperazine (COMPAZINE) injection 5 mg  5 mg Intravenous Q6H PRN Reubin Milan, MD         Discharge Medications: Please see discharge summary for a list of discharge medications.  Relevant Imaging Results:  Relevant Lab Results:   Additional Information SSN [id number]  Dessa Phi, RN

## 2021-06-07 NOTE — Discharge Summary (Signed)
Physician Discharge Summary  Ryan [name] [id number] DOB: March 28, 1946 DOA: 06/06/2021  PCP: [name], MD  Admit date: 06/06/2021 Discharge date: 06/07/2021  Admitted From: SNF  Disposition:  SNF   Recommendations for Outpatient Follow-up and new medication changes:  Follow up with Dr. Joylene Igo in 7 to 10 days.  Left nephrostomy tube was replaced and patient will follow up with urology as outpatient.   Home Health: na   Equipment/Devices: na    Discharge Condition: stable  CODE STATUS: full  Diet recommendation:  heart healthy and diabetic prudent.   Brief/Interim Summary: Ryan [name] was admitted to the hospital with the working diagnosis of left hydronephrosis sp accidentally pulling out  nephrostomy tube.    75 year old male past medical history for osteoarthritis, SVT, heart failure with ejection fraction 25 to 30%, paroxysmal atrial fibrillation, history of endocarditis, hypertension,and peripheral vascular disease. Recent hospitalization 04/08/2021-05/22/2021, for right lower extremity critical limb ischemia and osteomyelitis, he underwent above-the-knee amputation and eventually he was transferred to skilled nursing facility.  During this past hospitalization he was diagnosed with left hydronephrosis secondary to a large left retroperitoneal hematoma.  Patient underwent percutaneous nephrostomy tube placement 04/20/2021 with plan to continue tube for at least 2 months. Apparently accidentally the nephrostomy tube was pulled out and patient was transported back to the hospital for further evaluation. On his initial physical examination blood pressure 146/71, heart rate 74, respiratory rate 15, oxygen saturation 100%, his lungs were clear to auscultation bilaterally, heart S1-S2, present, rhythmic, soft abdomen, lower extremity with right AKI.    Sodium 142, potassium 4.1, chloride 108, bicarb 28, glucose 100, BUN 29, creatinine 1.16, white count 7.8, hemoglobin 9.7, hematocrit 33.7,  platelets 339. SARS COVID-19 negative.   CT of the abdomen with left retroperitoneal organizing hematoma appears minimally decreased in size compared to prior study.  Left nephrostomy tube is out, mild progression of left hydronephrosis.  Diffuse aortic sclerosis.  Prominent bronchiectasis and fibrosis in the lung bases.  Small cavitary nodules in the right lung base.   Patient underwent left nephrostomy tube placement per interventional radiology with good toleration.   Left obstructive hydronephrosis due to retroperitoneal hematoma. Patient underwent successful left nephrostomy tube placement, 10 French drain in the left renal pelvis was inserted.  Left ureter stent was found to be patent.  Patient will follow-up with urology as an outpatient.  2.  Chronic systolic heart failure.  No signs of acute decompensation, continue midodrine for blood pressure control. Continue empagliflozin.  3.  SVT.  Patient remained sinus rhythm during hospitalization, continue amiodarone and outpatient follow-up.  4.  Peripheral artery disease.  Continue wound care, right above-knee amputation.  Wound care to left heel. Continue atorvastatin.  5.  Left lower extremity iliac DVT, pulmonary embolism. Continue anticoagulation with apixaban.  6.  Chronic normocytic anemia.  Hemoglobin has remained stable, no current indication for PRBC transfusion.  7.  Type 2 diabetes mellitus/ dyslipidemia.  His glucose remained stable during this hospitalization.  Continue statin therapy.  Discharge Diagnoses:  Principal Problem:   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[place]) Active Problems:   Chronic systolic heart failure (HCC)   Type 2 diabetes mellitus (HCC)   SVT (supraventricular tachycardia)  long RP   PAD (peripheral artery disease) (HCC)   Normocytic anemia   DVT of left external iliac vein 04/2021   Pulmonary emboli 04/2021   Multiple pulmonary nodules    Discharge Instructions  Discharge Instructions      Diet - low sodium heart healthy  Complete by: As directed    Discharge instructions   Complete by: As directed    Please follow up with urology as scheduled   Discharge wound care:   Complete by: As directed    Continue wound care as prescribed prior to hospitalization.   Increase activity slowly   Complete by: As directed       Allergies as of 06/07/2021       Reactions   Tuna [fish Allergy] Nausea And Vomiting        Medication List     TAKE these medications    acetaminophen 325 MG tablet Commonly known as: TYLENOL Take 650 mg by mouth every 6 (six) hours as needed for mild pain or headache.   amiodarone 200 MG tablet Commonly known as: PACERONE TAKE 1 TABLET BY MOUTH EVERY DAY What changed: when to take this   apixaban 5 MG Tabs tablet Commonly known as: ELIQUIS Take 1 tablet (5 mg total) by mouth every 12 (twelve) hours.   atorvastatin 40 MG tablet Commonly known as: LIPITOR Take 1 tablet (40 mg total) by mouth daily at 6 PM. What changed: when to take this   bisacodyl 5 MG EC tablet Commonly known as: DULCOLAX Take 5 mg by mouth daily as needed (constipation).   docusate sodium 100 MG capsule Commonly known as: COLACE Take 100 mg by mouth every morning.   empagliflozin 10 MG Tabs tablet Commonly known as: JARDIANCE Take 1 tablet (10 mg total) by mouth daily.   feeding supplement (PRO-STAT SUGAR FREE 64) Liqd Take 30 mLs by mouth 3 (three) times daily. Take with 4 oz juice   feeding supplement Liqd Take 237 mLs by mouth 2 (two) times daily between meals.   gabapentin 300 MG capsule Commonly known as: NEURONTIN Take 300 mg by mouth at bedtime.   guaiFENesin 100 MG/5ML Soln Commonly known as: ROBITUSSIN Take 5 mLs by mouth every 4 (four) hours as needed for cough or to loosen phlegm.   midodrine 10 MG tablet Commonly known as: PROAMATINE Take 1 tablet (10 mg total) by mouth 3 (three) times daily with meals.   pantoprazole 40 MG  tablet Commonly known as: PROTONIX Take 40 mg by mouth every morning.   triamcinolone cream 0.5 % Commonly known as: KENALOG Apply 1 application topically daily as needed (skin rash).               Discharge Care Instructions  (From admission, onward)           Start     Ordered   06/07/21 0000  Discharge wound care:       Comments: Continue wound care as prescribed prior to hospitalization.   06/07/21 1318            Allergies  Allergen Reactions   Tuna [Fish Allergy] Nausea And Vomiting    Consultations: IR Urology    Procedures/Studies: CT ABDOMEN PELVIS WO CONTRAST  Result Date: 06/06/2021 CLINICAL DATA:  Follow-up of retroperitoneal hematoma. Nephrostomy tube fell out today. EXAM: CT ABDOMEN AND PELVIS WITHOUT CONTRAST TECHNIQUE: Multidetector CT imaging of the abdomen and pelvis was performed following the standard protocol without IV contrast. COMPARISON:  04/24/2021 FINDINGS: Lower chest: Cylindrical bronchiectasis and fibrosis in the lung bases. Three cavitated nodular opacities in the right lower lung posteriorly, largest measuring about 12 mm in diameter. These were not definitely present on the previous study and are likely inflammatory. Follow-up to resolution is recommended. Cardiac enlargement. Postoperative changes in the  mediastinum. Hepatobiliary: No focal liver lesions are identified on noncontrast imaging. Cholelithiasis with small stones in the gallbladder. No inflammatory changes. No bile duct dilatation. Pancreas: Unremarkable. No pancreatic ductal dilatation or surrounding inflammatory changes. Spleen: Normal in size without focal abnormality. Adrenals/Urinary Tract: No adrenal gland nodules. Left nephrostomy tube has been removed in the interval. Mild left hydronephrosis and hydroureter to the level of the distal ureter, likely a degree of obstruction due to extrinsic compression. Right kidney and ureter are unremarkable. Mild bladder wall  thickening may indicate cystitis or outlet obstruction. Stomach/Bowel: Stomach, small bowel, and colon are not abnormally distended. Stool throughout the colon. Prominent stool in the rectum. Appendix is normal. Vascular/Lymphatic: Diffuse aortic calcification. No aneurysm. No significant lymphadenopathy. Reproductive: Prostate gland is not enlarged. Other: Large left iliopsoas collection, likely representing organizing hematoma. Collection today measures up to about 5.5 x 11.3 cm. There is mild retraction of the hematoma since previous study. No free air or free fluid in the abdomen. Musculoskeletal: Degenerative changes in the spine and hips. Compression deformities at L2, L3, and L5 levels. No progression since previous study. IMPRESSION: 1. Left retroperitoneal organizing hematoma appears minimally decreased in size since prior study. 2. Left nephrostomy tube is out. Mild progression of left hydronephrosis. 3. Diffuse aortic atherosclerosis. 4. Prominent bronchiectasis and fibrosis in the lung bases. Small cavitary nodules in the right lung base periphery, new since prior study, likely inflammatory. Short-term follow-up suggested in 3 months. 5. Cholelithiasis without evidence of acute cholecystitis. Electronically Signed   By: Lucienne Capers M.D.   On: 06/06/2021 19:16   DG CHEST PORT 1 VIEW  Result Date: 05/11/2021 CLINICAL DATA:  Cough, COVID-19 positive. EXAM: PORTABLE CHEST 1 VIEW COMPARISON:  May 08, 2021. FINDINGS: Stable cardiomegaly. Feeding tube is seen entering stomach. Left-sided pacemaker is unchanged in position. Status post cardiac valve repair. Right-sided PICC line is unchanged. No pneumothorax is noted. Stable bilateral lung opacities are noted concerning for edema or pneumonia with small bilateral pleural effusions. Bony thorax is unremarkable. IMPRESSION: Stable support apparatus. Stable bilateral lung opacities are noted. Aortic Atherosclerosis (ICD10-I70.0). Electronically Signed    By: Marijo Conception M.D.   On: 05/11/2021 11:57   IR NEPHROSTOMY EXCHANGE LEFT  Result Date: 06/07/2021 INDICATION: Dislodged left nephrostomy tube. EXAM: REPLACEMENT OF LEFT NEPHROSTOMY TUBE WITH FLUOROSCOPY Physician: Stephan Minister. Anselm Pancoast, MD COMPARISON:  None. MEDICATIONS: Local anesthetic, 1% lidocaine. ANESTHESIA/SEDATION: None CONTRAST:  56m OMNIPAQUE IOHEXOL 300 MG/ML SOLN - administered into the collecting system(s) FLUOROSCOPY TIME:  Fluoroscopy Time: 2 minutes, 42 seconds, 13 mGy COMPLICATIONS: None immediate. PROCEDURE: The procedure was explained to the patient. The risks and benefits of the procedure were discussed and the patient's questions were addressed. Informed consent was obtained from the patient. Patient was placed prone on the interventional table. Left flank was prepped and draped in sterile fashion. Maximal barrier sterile technique was utilized including caps, mask, sterile gowns, sterile gloves, sterile drape, hand hygiene and skin antiseptic. Skin was anesthetized with 1% lidocaine. Five [place] catheter was advanced through the old nephrostomy tube hole into the renal collecting system using a Bentson wire and fluoroscopic guidance. Contrast injection confirmed placement in the renal collecting system. A 10 [place] drain was initially advanced over a Bentson wire and then switched to a superstiff Amplatz wire. The tube was reconstituted in the renal pelvis. Nephrostogram was performed. Catheter was sutured to the skin with Prolene suture. Fluoroscopic images were taken and saved for this procedure. FINDINGS: New 10  French drain is positioned in the renal pelvis. Mild dilatation of left renal pelvis and proximal ureter. Nephrostogram confirms patency of the left ureter with drainage into the urinary bladder. IMPRESSION: 1. Successful replacement of the left percutaneous nephrostomy tube with fluoroscopic guidance. 2. Nephrostogram demonstrates a patent left ureter. This  finding was discussed with Dr. Gloriann Loan in urology. Electronically Signed   By: Markus Daft M.D.   On: 06/07/2021 11:31     Procedures: left nephrostomy tube placement,   Subjective: Patient is feeling well, no nausea or vomiting, no chest pain or dyspnea   Discharge Exam: Vitals:   06/07/21 0600 06/07/21 1042  BP: 130/66 129/67  Pulse: 73 77  Resp: 18 14  Temp: 97.7 F (36.5 C) 98 F (36.7 C)  SpO2: 100% 100%   Vitals:   06/07/21 0330 06/07/21 0500 06/07/21 0600 06/07/21 1042  BP: 135/68 134/65 130/66 129/67  Pulse: 70 70 73 77  Resp: '17 14 18 14  '$ Temp:   97.7 F (36.5 C) 98 F (36.7 C)  TempSrc:   Oral Oral  SpO2: 97% 97% 100% 100%  Weight:      Height:        General: Not in pain or dyspnea.  Neurology: Awake and alert, non focal  E ENT: no pallor, no icterus, oral mucosa moist Cardiovascular: No JVD. S1-S2 present, rhythmic, no gallops, rubs, or murmurs. No lower extremity edema. Pulmonary: positive breath sounds bilaterally, adequate air movement, no wheezing, rhonchi or rales. Gastrointestinal. Abdomen soft and non tender Left flank nephrostomy tube in place.  Skin. Left heal pressure ulcers with dressing in place Musculoskeletal: right AKA with clean wound.    The results of significant diagnostics from this hospitalization (including imaging, microbiology, ancillary and laboratory) are listed below for reference.     Microbiology: Recent Results (from the past 240 hour(s))  Resp Panel by RT-PCR (Flu A&B, Covid) Nasopharyngeal Swab     Status: None   Collection Time: 06/06/21  9:04 PM   Specimen: Nasopharyngeal Swab; Nasopharyngeal(NP) swabs in vial transport medium  Result Value Ref Range Status   SARS Coronavirus 2 by RT PCR NEGATIVE NEGATIVE Final    Comment: (NOTE) SARS-CoV-2 target nucleic acids are NOT DETECTED.  The SARS-CoV-2 RNA is generally detectable in upper respiratory specimens during the acute phase of infection. The lowest concentration of  SARS-CoV-2 viral copies this assay can detect is 138 copies/mL. A negative result does not preclude SARS-Cov-2 infection and should not be used as the sole basis for treatment or other patient management decisions. A negative result may occur with  improper specimen collection/handling, submission of specimen other than nasopharyngeal swab, presence of viral mutation(s) within the areas targeted by this assay, and inadequate number of viral copies(<138 copies/mL). A negative result must be combined with clinical observations, patient history, and epidemiological information. The expected result is Negative.  Fact Sheet for Patients:  EntrepreneurPulse.com.au  Fact Sheet for Healthcare Providers:  IncredibleEmployment.be  This test is no t yet approved or cleared by the [place] FDA and  has been authorized for detection and/or diagnosis of SARS-CoV-2 by FDA under an Emergency Use Authorization (EUA). This EUA will remain  in effect (meaning this test can be used) for the duration of the COVID-19 declaration under Section 564(b)(1) of the Act, 21 U.S.C.section 360bbb-3(b)(1), unless the authorization is terminated  or revoked sooner.       Influenza A by PCR NEGATIVE NEGATIVE Final   Influenza B by PCR NEGATIVE  NEGATIVE Final    Comment: (NOTE) The Xpert Xpress SARS-CoV-2/FLU/RSV plus assay is intended as an aid in the diagnosis of influenza from Nasopharyngeal swab specimens and should not be used as a sole basis for treatment. Nasal washings and aspirates are unacceptable for Xpert Xpress SARS-CoV-2/FLU/RSV testing.  Fact Sheet for Patients: EntrepreneurPulse.com.au  Fact Sheet for Healthcare Providers: IncredibleEmployment.be  This test is not yet approved or cleared by the [place] FDA and has been authorized for detection and/or diagnosis of SARS-CoV-2 by FDA under an Emergency Use  Authorization (EUA). This EUA will remain in effect (meaning this test can be used) for the duration of the COVID-19 declaration under Section 564(b)(1) of the Act, 21 U.S.C. section 360bbb-3(b)(1), unless the authorization is terminated or revoked.  Performed at [facility], [place] [address], [place], [place] [address]      Labs: BNP (last 3 results) Recent Labs    04/28/21 0116 04/29/21 0826 05/02/21 2338  BNP 1,706.1* 2,208.9* 0000000*   Basic Metabolic Panel: Recent Labs  Lab 06/06/21 1925  NA 142  K 4.1  CL 108  CO2 28  GLUCOSE 100*  BUN 29*  CREATININE 1.16  CALCIUM 8.9   Liver Function Tests: Recent Labs  Lab 06/06/21 1925  AST 28  ALT 16  ALKPHOS 115  BILITOT 1.0  PROT 6.8  ALBUMIN 2.8*   No results for input(s): LIPASE, AMYLASE in the last 168 hours. No results for input(s): AMMONIA in the last 168 hours. CBC: Recent Labs  Lab 06/06/21 1925  WBC 7.8  NEUTROABS 4.4  HGB 9.7*  HCT 33.7*  MCV 78.0*  PLT 339   Cardiac Enzymes: No results for input(s): CKTOTAL, CKMB, CKMBINDEX, TROPONINI in the last 168 hours. BNP: Invalid input(s): POCBNP CBG: Recent Labs  Lab 06/07/21 0034 06/07/21 0555 06/07/21 1228  GLUCAP 90 75 91   D-Dimer No results for input(s): DDIMER in the last 72 hours. Hgb A1c No results for input(s): HGBA1C in the last 72 hours. Lipid Profile No results for input(s): CHOL, HDL, LDLCALC, TRIG, CHOLHDL, LDLDIRECT in the last 72 hours. Thyroid function studies No results for input(s): TSH, T4TOTAL, T3FREE, THYROIDAB in the last 72 hours.  Invalid input(s): FREET3 Anemia work up No results for input(s): VITAMINB12, FOLATE, FERRITIN, TIBC, IRON, RETICCTPCT in the last 72 hours. Urinalysis    Component Value Date/Time   COLORURINE AMBER (A) 04/20/2021 0704   APPEARANCEUR CLOUDY (A) 04/20/2021 0704   LABSPEC 1.023 04/20/2021 0704   PHURINE 5.0 04/20/2021 0704   GLUCOSEU NEGATIVE 04/20/2021 0704    HGBUR SMALL (A) 04/20/2021 0704   BILIRUBINUR SMALL (A) 04/20/2021 0704   KETONESUR NEGATIVE 04/20/2021 0704   PROTEINUR 30 (A) 04/20/2021 0704   UROBILINOGEN 1.0 09/12/2012 2055   NITRITE NEGATIVE 04/20/2021 0704   LEUKOCYTESUR LARGE (A) 04/20/2021 0704   Sepsis Labs Invalid input(s): PROCALCITONIN,  WBC,  LACTICIDVEN Microbiology Recent Results (from the past 240 hour(s))  Resp Panel by RT-PCR (Flu A&B, Covid) Nasopharyngeal Swab     Status: None   Collection Time: 06/06/21  9:04 PM   Specimen: Nasopharyngeal Swab; Nasopharyngeal(NP) swabs in vial transport medium  Result Value Ref Range Status   SARS Coronavirus 2 by RT PCR NEGATIVE NEGATIVE Final    Comment: (NOTE) SARS-CoV-2 target nucleic acids are NOT DETECTED.  The SARS-CoV-2 RNA is generally detectable in upper respiratory specimens during the acute phase of infection. The lowest concentration of SARS-CoV-2 viral copies this assay can detect is 138 copies/mL.  A negative result does not preclude SARS-Cov-2 infection and should not be used as the sole basis for treatment or other patient management decisions. A negative result may occur with  improper specimen collection/handling, submission of specimen other than nasopharyngeal swab, presence of viral mutation(s) within the areas targeted by this assay, and inadequate number of viral copies(<138 copies/mL). A negative result must be combined with clinical observations, patient history, and epidemiological information. The expected result is Negative.  Fact Sheet for Patients:  EntrepreneurPulse.com.au  Fact Sheet for Healthcare Providers:  IncredibleEmployment.be  This test is no t yet approved or cleared by the [place] FDA and  has been authorized for detection and/or diagnosis of SARS-CoV-2 by FDA under an Emergency Use Authorization (EUA). This EUA will remain  in effect (meaning this test can be used) for the duration of  the COVID-19 declaration under Section 564(b)(1) of the Act, 21 U.S.C.section 360bbb-3(b)(1), unless the authorization is terminated  or revoked sooner.       Influenza A by PCR NEGATIVE NEGATIVE Final   Influenza B by PCR NEGATIVE NEGATIVE Final    Comment: (NOTE) The Xpert Xpress SARS-CoV-2/FLU/RSV plus assay is intended as an aid in the diagnosis of influenza from Nasopharyngeal swab specimens and should not be used as a sole basis for treatment. Nasal washings and aspirates are unacceptable for Xpert Xpress SARS-CoV-2/FLU/RSV testing.  Fact Sheet for Patients: EntrepreneurPulse.com.au  Fact Sheet for Healthcare Providers: IncredibleEmployment.be  This test is not yet approved or cleared by the [place] FDA and has been authorized for detection and/or diagnosis of SARS-CoV-2 by FDA under an Emergency Use Authorization (EUA). This EUA will remain in effect (meaning this test can be used) for the duration of the COVID-19 declaration under Section 564(b)(1) of the Act, 21 U.S.C. section 360bbb-3(b)(1), unless the authorization is terminated or revoked.  Performed at [facility], [place] [address], [place], [place] [address]      Time coordinating discharge: 45 minutes  SIGNED:   Tawni Millers, MD  [facility] 06/07/2021, 1:19 PM

## 2021-06-07 NOTE — Plan of Care (Signed)

## 2021-06-08 DIAGNOSIS — I70245 Atherosclerosis of native arteries of left leg with ulceration of other part of foot: Secondary | ICD-10-CM | POA: Diagnosis not present

## 2021-06-08 DIAGNOSIS — L89620 Pressure ulcer of left heel, unstageable: Secondary | ICD-10-CM | POA: Diagnosis not present

## 2021-06-08 DIAGNOSIS — E1159 Type 2 diabetes mellitus with other circulatory complications: Secondary | ICD-10-CM | POA: Diagnosis not present

## 2021-06-08 DIAGNOSIS — L89150 Pressure ulcer of sacral region, unstageable: Secondary | ICD-10-CM | POA: Diagnosis not present

## 2021-06-15 ENCOUNTER — Telehealth: Payer: Self-pay

## 2021-06-15 ENCOUNTER — Ambulatory Visit (INDEPENDENT_AMBULATORY_CARE_PROVIDER_SITE_OTHER): Payer: Medicare HMO

## 2021-06-15 DIAGNOSIS — E1159 Type 2 diabetes mellitus with other circulatory complications: Secondary | ICD-10-CM | POA: Diagnosis not present

## 2021-06-15 DIAGNOSIS — L89620 Pressure ulcer of left heel, unstageable: Secondary | ICD-10-CM | POA: Diagnosis not present

## 2021-06-15 DIAGNOSIS — I5022 Chronic systolic (congestive) heart failure: Secondary | ICD-10-CM

## 2021-06-15 DIAGNOSIS — L89150 Pressure ulcer of sacral region, unstageable: Secondary | ICD-10-CM | POA: Diagnosis not present

## 2021-06-15 DIAGNOSIS — I70245 Atherosclerosis of native arteries of left leg with ulceration of other part of foot: Secondary | ICD-10-CM | POA: Diagnosis not present

## 2021-06-15 LAB — CUP PACEART REMOTE DEVICE CHECK
Battery Remaining Longevity: 45 mo
Battery Voltage: 2.98 V
Brady Statistic AP VP Percent: 0.02 %
Brady Statistic AP VS Percent: 0.67 %
Brady Statistic AS VP Percent: 0.05 %
Brady Statistic AS VS Percent: 99.26 %
Brady Statistic RA Percent Paced: 0.65 %
Brady Statistic RV Percent Paced: 0.05 %
Date Time Interrogation Session: 20220926044225
HighPow Impedance: 49 Ohm
Implantable Lead Implant Date: 20170922
Implantable Lead Implant Date: 20170922
Implantable Lead Location: 753859
Implantable Lead Location: 753860
Implantable Lead Model: 5076
Implantable Pulse Generator Implant Date: 20170922
Lead Channel Impedance Value: 285 Ohm
Lead Channel Impedance Value: 342 Ohm
Lead Channel Impedance Value: 361 Ohm
Lead Channel Pacing Threshold Amplitude: 0.625 V
Lead Channel Pacing Threshold Amplitude: 0.875 V
Lead Channel Pacing Threshold Pulse Width: 0.4 ms
Lead Channel Pacing Threshold Pulse Width: 0.4 ms
Lead Channel Sensing Intrinsic Amplitude: 1.75 mV
Lead Channel Sensing Intrinsic Amplitude: 1.75 mV
Lead Channel Sensing Intrinsic Amplitude: 6.5 mV
Lead Channel Sensing Intrinsic Amplitude: 6.5 mV
Lead Channel Setting Pacing Amplitude: 2 V
Lead Channel Setting Pacing Amplitude: 2.5 V
Lead Channel Setting Pacing Pulse Width: 0.4 ms
Lead Channel Setting Sensing Sensitivity: 0.3 mV

## 2021-06-15 NOTE — Telephone Encounter (Signed)
Kela from [facility] center calls today to report that patient has developed an ulcer and rest pain in his left lower extremity. He is scheduled for staple removal tomorrow with PA, added note to look at left lower extremity. Patient's ABIs performed in July showed non-compressible vessels.

## 2021-06-16 DIAGNOSIS — R410 Disorientation, unspecified: Secondary | ICD-10-CM | POA: Diagnosis not present

## 2021-06-16 DIAGNOSIS — I96 Gangrene, not elsewhere classified: Secondary | ICD-10-CM | POA: Diagnosis not present

## 2021-06-16 DIAGNOSIS — I739 Peripheral vascular disease, unspecified: Secondary | ICD-10-CM | POA: Diagnosis not present

## 2021-06-16 DIAGNOSIS — L89896 Pressure-induced deep tissue damage of other site: Secondary | ICD-10-CM | POA: Diagnosis not present

## 2021-06-16 DIAGNOSIS — Z89611 Acquired absence of right leg above knee: Secondary | ICD-10-CM | POA: Diagnosis not present

## 2021-06-17 ENCOUNTER — Telehealth: Payer: Self-pay

## 2021-06-17 NOTE — Telephone Encounter (Signed)
[facility] alert received- Noted TriageHFT (HF Risk) Alert: High (Ongoing). Routing to triage to assess.  Attempted to reach pt to assess, no answer, VM is full.  Left VM for daughter Otila [facility] on file, requesting she have patient call us back.  Pt is not currently prescribed any diuretics.      EP MD- [name]; HF MD- Aundra Dubin

## 2021-06-22 ENCOUNTER — Ambulatory Visit: Payer: Medicare HMO

## 2021-06-22 DIAGNOSIS — M79672 Pain in left foot: Secondary | ICD-10-CM | POA: Diagnosis not present

## 2021-06-22 DIAGNOSIS — I96 Gangrene, not elsewhere classified: Secondary | ICD-10-CM | POA: Diagnosis not present

## 2021-06-22 DIAGNOSIS — L89896 Pressure-induced deep tissue damage of other site: Secondary | ICD-10-CM | POA: Diagnosis not present

## 2021-06-22 DIAGNOSIS — M79605 Pain in left leg: Secondary | ICD-10-CM | POA: Diagnosis not present

## 2021-06-22 DIAGNOSIS — I739 Peripheral vascular disease, unspecified: Secondary | ICD-10-CM | POA: Diagnosis not present

## 2021-06-22 DIAGNOSIS — Z89611 Acquired absence of right leg above knee: Secondary | ICD-10-CM | POA: Diagnosis not present

## 2021-06-22 NOTE — Progress Notes (Signed)
Remote ICD transmission.   

## 2021-06-23 ENCOUNTER — Ambulatory Visit (INDEPENDENT_AMBULATORY_CARE_PROVIDER_SITE_OTHER): Payer: Self-pay | Admitting: Physician Assistant

## 2021-06-23 ENCOUNTER — Encounter: Payer: Self-pay | Admitting: Physician Assistant

## 2021-06-23 ENCOUNTER — Other Ambulatory Visit: Payer: Self-pay

## 2021-06-23 VITALS — BP 86/50 | HR 79 | Temp 97.7°F | Resp 20 | Ht 72.0 in

## 2021-06-23 DIAGNOSIS — S91309A Unspecified open wound, unspecified foot, initial encounter: Secondary | ICD-10-CM

## 2021-06-23 DIAGNOSIS — I96 Gangrene, not elsewhere classified: Secondary | ICD-10-CM | POA: Diagnosis not present

## 2021-06-23 DIAGNOSIS — I739 Peripheral vascular disease, unspecified: Secondary | ICD-10-CM | POA: Diagnosis not present

## 2021-06-23 DIAGNOSIS — I779 Disorder of arteries and arterioles, unspecified: Secondary | ICD-10-CM

## 2021-06-23 DIAGNOSIS — M79672 Pain in left foot: Secondary | ICD-10-CM | POA: Diagnosis not present

## 2021-06-23 LAB — CUP PACEART REMOTE DEVICE CHECK
Battery Remaining Longevity: 45 mo
Battery Voltage: 2.97 V
Brady Statistic AP VP Percent: 0.01 %
Brady Statistic AP VS Percent: 0.6 %
Brady Statistic AS VP Percent: 0.03 %
Brady Statistic AS VS Percent: 99.36 %
Brady Statistic RA Percent Paced: 0.54 %
Brady Statistic RV Percent Paced: 0.03 %
Date Time Interrogation Session: 20221003012407
HighPow Impedance: 58 Ohm
Implantable Lead Implant Date: 20170922
Implantable Lead Implant Date: 20170922
Implantable Lead Location: 753859
Implantable Lead Location: 753860
Implantable Lead Model: 5076
Implantable Pulse Generator Implant Date: 20170922
Lead Channel Impedance Value: 285 Ohm
Lead Channel Impedance Value: 342 Ohm
Lead Channel Impedance Value: 399 Ohm
Lead Channel Pacing Threshold Amplitude: 0.625 V
Lead Channel Pacing Threshold Amplitude: 0.75 V
Lead Channel Pacing Threshold Pulse Width: 0.4 ms
Lead Channel Pacing Threshold Pulse Width: 0.4 ms
Lead Channel Sensing Intrinsic Amplitude: 1.5 mV
Lead Channel Sensing Intrinsic Amplitude: 1.5 mV
Lead Channel Sensing Intrinsic Amplitude: 6.625 mV
Lead Channel Sensing Intrinsic Amplitude: 6.625 mV
Lead Channel Setting Pacing Amplitude: 2 V
Lead Channel Setting Pacing Amplitude: 2.5 V
Lead Channel Setting Pacing Pulse Width: 0.4 ms
Lead Channel Setting Sensing Sensitivity: 0.3 mV

## 2021-06-23 NOTE — Progress Notes (Signed)
POST OPERATIVE OFFICE NOTE    CC:  F/u for surgery  HPI:  This is a 75 y.o. male who is s/p right popliteal to peroneal bypass with vein by Dr. [name] on 03/06/2021 due to critical limb ischemia and tissue loss of right foot.  Right foot tissue loss worsened and he was readmitted to the hospital on 04/09/2021 and underwent first and second toe amputation, heel debridement, and application of myriad skin substitute on the heel with a wound VAC.  Over the course of this hospital stay was obvious that the skin substitute and toe amputation sites were nonhealing.  During hospitalization patient was also noted to have ulcerations on the contralateral limb, left lower extremity.  He underwent angiogram which demonstrated occlusion of all 3 tibial vessels and no options for revascularization.  He eventually underwent right above-the-knee amputation by Dr. Scot Dock on 05/12/2021.  He was discharged to a nursing facility.  He returns today for staple removal from right above-the-knee amputation as well as to evaluate worsening toe ulcerations of left foot.  He is accompanied by his sister as well as his daughter who is over the phone.  Allergies  Allergen Reactions   Geralyn Flash [Fish Allergy] Nausea And Vomiting    Current Outpatient Medications  Medication Sig Dispense Refill   acetaminophen (TYLENOL) 325 MG tablet Take 650 mg by mouth every 6 (six) hours as needed for mild pain or headache.     Amino Acids-Protein Hydrolys (FEEDING SUPPLEMENT, PRO-STAT SUGAR FREE 64,) LIQD Take 30 mLs by mouth 3 (three) times daily. Take with 4 oz juice     amiodarone (PACERONE) 200 MG tablet TAKE 1 TABLET BY MOUTH EVERY DAY (Patient taking differently: Take 200 mg by mouth every morning.) 90 tablet 3   apixaban (ELIQUIS) 5 MG TABS tablet Take 1 tablet (5 mg total) by mouth every 12 (twelve) hours. 60 tablet    atorvastatin (LIPITOR) 40 MG tablet Take 1 tablet (40 mg total) by mouth daily at 6 PM. (Patient taking differently:  Take 40 mg by mouth at bedtime.) 30 tablet 5   bisacodyl (DULCOLAX) 5 MG EC tablet Take 5 mg by mouth daily as needed (constipation).     docusate sodium (COLACE) 100 MG capsule Take 100 mg by mouth every morning.     empagliflozin (JARDIANCE) 10 MG TABS tablet Take 1 tablet (10 mg total) by mouth daily. 90 tablet 3   feeding supplement (ENSURE ENLIVE / ENSURE PLUS) LIQD Take 237 mLs by mouth 2 (two) times daily between meals. 237 mL 12   gabapentin (NEURONTIN) 300 MG capsule Take 300 mg by mouth at bedtime.     guaiFENesin (ROBITUSSIN) 100 MG/5ML SOLN Take 5 mLs by mouth every 4 (four) hours as needed for cough or to loosen phlegm.     midodrine (PROAMATINE) 10 MG tablet Take 1 tablet (10 mg total) by mouth 3 (three) times daily with meals.     pantoprazole (PROTONIX) 40 MG tablet Take 40 mg by mouth every morning.     triamcinolone cream (KENALOG) 0.5 % Apply 1 application topically daily as needed (skin rash).      No current facility-administered medications for this visit.     ROS:  See HPI  Physical Exam:  Vitals:   06/23/21 0955  BP: (!) 86/50  Pulse: 79  Resp: 20  Temp: 97.7 F (36.5 C)  TempSrc: Temporal  SpO2: 99%  Height: 6' (1.829 m)    Incision: Right AKA well-healed Extremities: Left foot  with toe tip ulcerations as well as tissue loss of the base of the great toe with erythema of the forefoot Neuro: Oriented but somnolent  Assessment/Plan:  This is a 75 y.o. male who is s/p right above-the-knee amputation after failing to heal right toe ulcerations despite previous bypass surgery.  Also with severe tibial disease and left toe ulceration with no options for revascularization  -Right above-the-knee amputation is well-healed.  Staples were removed. -Left foot with worsening tissue loss of toes and lateral foot.  Clinically patient also appears weak and frail.  We discussed previous angiogram during hospital stay which demonstrated severe occlusive tibial disease with  no options for revascularization of left lower extremity.  Given this information the options would include aggressive wound care or left above-the-knee amputation.  The patient as well as his sister and daughter are not willing to proceed with amputation at this time however would like to discuss this as a family.  In the meantime we will proceed with aggressive wound care involving daily cleansing with soap and water, separating toes with dry gauze daily, painting toes with Betadine, offloading heel and lateral foot with pneumatic boot.  Patient will return to office in a few weeks to 1 month to discuss amputation with Dr. Elwyn Lade, PA-C [facility] [phone]  Clinic MD:  Carlis Abbott

## 2021-06-24 DIAGNOSIS — L89150 Pressure ulcer of sacral region, unstageable: Secondary | ICD-10-CM | POA: Diagnosis not present

## 2021-06-24 DIAGNOSIS — E1159 Type 2 diabetes mellitus with other circulatory complications: Secondary | ICD-10-CM | POA: Diagnosis not present

## 2021-06-24 DIAGNOSIS — L89623 Pressure ulcer of left heel, stage 3: Secondary | ICD-10-CM | POA: Diagnosis not present

## 2021-06-24 DIAGNOSIS — I70245 Atherosclerosis of native arteries of left leg with ulceration of other part of foot: Secondary | ICD-10-CM | POA: Diagnosis not present

## 2021-06-24 NOTE — Telephone Encounter (Signed)
Attempted call to patient and no answer.  Pt currently resides in SNF and unable to participate in monthly ICM follow up.  Pt being monitored at inpatient facility.

## 2021-06-24 NOTE — Telephone Encounter (Signed)
Successful telephone encounter to patient to follow up on chronically elevated OptiVol. Most recent transmission 06/22/21 shows ongoing Optivol elevations with thoracic impedence blow threshold but stable. Per EMR documentation patient engaged in [facility] clinic with last engagement 06/03/21. Patient states his symptoms are stable at this time. No diuretics prescribed. Continues amio and eliquis. Will route to [facility] RN for review and follow up.

## 2021-06-25 DIAGNOSIS — I998 Other disorder of circulatory system: Secondary | ICD-10-CM | POA: Diagnosis not present

## 2021-06-25 DIAGNOSIS — M79672 Pain in left foot: Secondary | ICD-10-CM | POA: Diagnosis not present

## 2021-06-25 DIAGNOSIS — I739 Peripheral vascular disease, unspecified: Secondary | ICD-10-CM | POA: Diagnosis not present

## 2021-06-25 DIAGNOSIS — I96 Gangrene, not elsewhere classified: Secondary | ICD-10-CM | POA: Diagnosis not present

## 2021-06-29 ENCOUNTER — Telehealth: Payer: Self-pay

## 2021-06-29 DIAGNOSIS — I739 Peripheral vascular disease, unspecified: Secondary | ICD-10-CM | POA: Diagnosis not present

## 2021-06-29 DIAGNOSIS — M79672 Pain in left foot: Secondary | ICD-10-CM | POA: Diagnosis not present

## 2021-06-29 DIAGNOSIS — I70245 Atherosclerosis of native arteries of left leg with ulceration of other part of foot: Secondary | ICD-10-CM | POA: Diagnosis not present

## 2021-06-29 DIAGNOSIS — M79605 Pain in left leg: Secondary | ICD-10-CM | POA: Diagnosis not present

## 2021-06-29 DIAGNOSIS — E1159 Type 2 diabetes mellitus with other circulatory complications: Secondary | ICD-10-CM | POA: Diagnosis not present

## 2021-06-29 DIAGNOSIS — I96 Gangrene, not elsewhere classified: Secondary | ICD-10-CM | POA: Diagnosis not present

## 2021-06-29 DIAGNOSIS — L89153 Pressure ulcer of sacral region, stage 3: Secondary | ICD-10-CM | POA: Diagnosis not present

## 2021-06-29 DIAGNOSIS — L89623 Pressure ulcer of left heel, stage 3: Secondary | ICD-10-CM | POA: Diagnosis not present

## 2021-06-29 DIAGNOSIS — I998 Other disorder of circulatory system: Secondary | ICD-10-CM | POA: Diagnosis not present

## 2021-06-29 NOTE — Telephone Encounter (Signed)
Received call from Evette at [facility] that patient and family would like to proceed with amputation and wants sooner appt. Placed on schedule next week for TH.

## 2021-06-30 NOTE — Progress Notes (Signed)
Remote ICD transmission.   

## 2021-07-01 ENCOUNTER — Encounter (HOSPITAL_COMMUNITY): Payer: Self-pay | Admitting: Emergency Medicine

## 2021-07-01 ENCOUNTER — Inpatient Hospital Stay (HOSPITAL_COMMUNITY)
Admission: EM | Admit: 2021-07-01 | Discharge: 2021-07-14 | DRG: 854 | Disposition: A | Discharge status: Skilled Nursing Facility | Payer: Medicare HMO | Source: Skilled Nursing Facility | Attending: Internal Medicine | Admitting: Internal Medicine

## 2021-07-01 ENCOUNTER — Emergency Department (HOSPITAL_COMMUNITY): Payer: Medicare HMO

## 2021-07-01 ENCOUNTER — Other Ambulatory Visit: Payer: Self-pay

## 2021-07-01 DIAGNOSIS — I959 Hypotension, unspecified: Secondary | ICD-10-CM | POA: Diagnosis not present

## 2021-07-01 DIAGNOSIS — Z89611 Acquired absence of right leg above knee: Secondary | ICD-10-CM | POA: Diagnosis not present

## 2021-07-01 DIAGNOSIS — N179 Acute kidney failure, unspecified: Secondary | ICD-10-CM | POA: Diagnosis present

## 2021-07-01 DIAGNOSIS — N1339 Other hydronephrosis: Secondary | ICD-10-CM | POA: Diagnosis not present

## 2021-07-01 DIAGNOSIS — D631 Anemia in chronic kidney disease: Secondary | ICD-10-CM | POA: Diagnosis present

## 2021-07-01 DIAGNOSIS — Z936 Other artificial openings of urinary tract status: Secondary | ICD-10-CM

## 2021-07-01 DIAGNOSIS — A408 Other streptococcal sepsis: Secondary | ICD-10-CM | POA: Diagnosis not present

## 2021-07-01 DIAGNOSIS — N1832 Chronic kidney disease, stage 3b: Secondary | ICD-10-CM | POA: Diagnosis present

## 2021-07-01 DIAGNOSIS — S78112A Complete traumatic amputation at level between left hip and knee, initial encounter: Secondary | ICD-10-CM | POA: Diagnosis not present

## 2021-07-01 DIAGNOSIS — E1169 Type 2 diabetes mellitus with other specified complication: Secondary | ICD-10-CM | POA: Diagnosis not present

## 2021-07-01 DIAGNOSIS — E1122 Type 2 diabetes mellitus with diabetic chronic kidney disease: Secondary | ICD-10-CM | POA: Diagnosis not present

## 2021-07-01 DIAGNOSIS — R4182 Altered mental status, unspecified: Secondary | ICD-10-CM

## 2021-07-01 DIAGNOSIS — K6289 Other specified diseases of anus and rectum: Secondary | ICD-10-CM | POA: Diagnosis not present

## 2021-07-01 DIAGNOSIS — B955 Unspecified streptococcus as the cause of diseases classified elsewhere: Secondary | ICD-10-CM | POA: Diagnosis not present

## 2021-07-01 DIAGNOSIS — I130 Hypertensive heart and chronic kidney disease with heart failure and stage 1 through stage 4 chronic kidney disease, or unspecified chronic kidney disease: Secondary | ICD-10-CM | POA: Diagnosis present

## 2021-07-01 DIAGNOSIS — K5900 Constipation, unspecified: Secondary | ICD-10-CM | POA: Diagnosis not present

## 2021-07-01 DIAGNOSIS — E785 Hyperlipidemia, unspecified: Secondary | ICD-10-CM | POA: Diagnosis not present

## 2021-07-01 DIAGNOSIS — Z79899 Other long term (current) drug therapy: Secondary | ICD-10-CM

## 2021-07-01 DIAGNOSIS — E1159 Type 2 diabetes mellitus with other circulatory complications: Secondary | ICD-10-CM

## 2021-07-01 DIAGNOSIS — E119 Type 2 diabetes mellitus without complications: Secondary | ICD-10-CM

## 2021-07-01 DIAGNOSIS — R404 Transient alteration of awareness: Secondary | ICD-10-CM | POA: Diagnosis not present

## 2021-07-01 DIAGNOSIS — N183 Chronic kidney disease, stage 3 unspecified: Secondary | ICD-10-CM | POA: Diagnosis present

## 2021-07-01 DIAGNOSIS — D62 Acute posthemorrhagic anemia: Secondary | ICD-10-CM | POA: Diagnosis not present

## 2021-07-01 DIAGNOSIS — Z951 Presence of aortocoronary bypass graft: Secondary | ICD-10-CM

## 2021-07-01 DIAGNOSIS — Z139 Encounter for screening, unspecified: Secondary | ICD-10-CM | POA: Diagnosis not present

## 2021-07-01 DIAGNOSIS — I4821 Permanent atrial fibrillation: Secondary | ICD-10-CM | POA: Diagnosis present

## 2021-07-01 DIAGNOSIS — Z8249 Family history of ischemic heart disease and other diseases of the circulatory system: Secondary | ICD-10-CM | POA: Diagnosis not present

## 2021-07-01 DIAGNOSIS — E1152 Type 2 diabetes mellitus with diabetic peripheral angiopathy with gangrene: Secondary | ICD-10-CM | POA: Diagnosis present

## 2021-07-01 DIAGNOSIS — Z95810 Presence of automatic (implantable) cardiac defibrillator: Secondary | ICD-10-CM

## 2021-07-01 DIAGNOSIS — R7881 Bacteremia: Secondary | ICD-10-CM | POA: Diagnosis not present

## 2021-07-01 DIAGNOSIS — Z8679 Personal history of other diseases of the circulatory system: Secondary | ICD-10-CM | POA: Diagnosis not present

## 2021-07-01 DIAGNOSIS — A409 Streptococcal sepsis, unspecified: Principal | ICD-10-CM | POA: Diagnosis present

## 2021-07-01 DIAGNOSIS — I10 Essential (primary) hypertension: Secondary | ICD-10-CM | POA: Diagnosis present

## 2021-07-01 DIAGNOSIS — R401 Stupor: Secondary | ICD-10-CM | POA: Diagnosis not present

## 2021-07-01 DIAGNOSIS — Z8673 Personal history of transient ischemic attack (TIA), and cerebral infarction without residual deficits: Secondary | ICD-10-CM

## 2021-07-01 DIAGNOSIS — Z86711 Personal history of pulmonary embolism: Secondary | ICD-10-CM

## 2021-07-01 DIAGNOSIS — E876 Hypokalemia: Secondary | ICD-10-CM | POA: Diagnosis not present

## 2021-07-01 DIAGNOSIS — I96 Gangrene, not elsewhere classified: Secondary | ICD-10-CM | POA: Diagnosis not present

## 2021-07-01 DIAGNOSIS — I5043 Acute on chronic combined systolic (congestive) and diastolic (congestive) heart failure: Secondary | ICD-10-CM | POA: Diagnosis not present

## 2021-07-01 DIAGNOSIS — E46 Unspecified protein-calorie malnutrition: Secondary | ICD-10-CM | POA: Diagnosis not present

## 2021-07-01 DIAGNOSIS — L89152 Pressure ulcer of sacral region, stage 2: Secondary | ICD-10-CM | POA: Diagnosis present

## 2021-07-01 DIAGNOSIS — Z4781 Encounter for orthopedic aftercare following surgical amputation: Secondary | ICD-10-CM | POA: Diagnosis not present

## 2021-07-01 DIAGNOSIS — R0902 Hypoxemia: Secondary | ICD-10-CM | POA: Diagnosis not present

## 2021-07-01 DIAGNOSIS — Z23 Encounter for immunization: Secondary | ICD-10-CM

## 2021-07-01 DIAGNOSIS — Z953 Presence of xenogenic heart valve: Secondary | ICD-10-CM

## 2021-07-01 DIAGNOSIS — M1990 Unspecified osteoarthritis, unspecified site: Secondary | ICD-10-CM | POA: Diagnosis present

## 2021-07-01 DIAGNOSIS — I739 Peripheral vascular disease, unspecified: Secondary | ICD-10-CM | POA: Diagnosis not present

## 2021-07-01 DIAGNOSIS — Z7901 Long term (current) use of anticoagulants: Secondary | ICD-10-CM

## 2021-07-01 DIAGNOSIS — G9341 Metabolic encephalopathy: Secondary | ICD-10-CM | POA: Diagnosis not present

## 2021-07-01 DIAGNOSIS — I2699 Other pulmonary embolism without acute cor pulmonale: Secondary | ICD-10-CM | POA: Diagnosis present

## 2021-07-01 DIAGNOSIS — I5022 Chronic systolic (congestive) heart failure: Secondary | ICD-10-CM | POA: Diagnosis present

## 2021-07-01 DIAGNOSIS — R6520 Severe sepsis without septic shock: Secondary | ICD-10-CM | POA: Diagnosis not present

## 2021-07-01 DIAGNOSIS — I70262 Atherosclerosis of native arteries of extremities with gangrene, left leg: Secondary | ICD-10-CM | POA: Diagnosis present

## 2021-07-01 DIAGNOSIS — Z86718 Personal history of other venous thrombosis and embolism: Secondary | ICD-10-CM | POA: Diagnosis not present

## 2021-07-01 DIAGNOSIS — L97929 Non-pressure chronic ulcer of unspecified part of left lower leg with unspecified severity: Secondary | ICD-10-CM | POA: Diagnosis not present

## 2021-07-01 DIAGNOSIS — J9601 Acute respiratory failure with hypoxia: Secondary | ICD-10-CM | POA: Diagnosis not present

## 2021-07-01 DIAGNOSIS — Z20822 Contact with and (suspected) exposure to covid-19: Secondary | ICD-10-CM | POA: Diagnosis present

## 2021-07-01 DIAGNOSIS — A419 Sepsis, unspecified organism: Secondary | ICD-10-CM

## 2021-07-01 DIAGNOSIS — R41841 Cognitive communication deficit: Secondary | ICD-10-CM | POA: Diagnosis not present

## 2021-07-01 DIAGNOSIS — S301XXA Contusion of abdominal wall, initial encounter: Secondary | ICD-10-CM | POA: Diagnosis not present

## 2021-07-01 DIAGNOSIS — S36892A Contusion of other intra-abdominal organs, initial encounter: Secondary | ICD-10-CM | POA: Diagnosis not present

## 2021-07-01 DIAGNOSIS — R5383 Other fatigue: Secondary | ICD-10-CM | POA: Diagnosis not present

## 2021-07-01 DIAGNOSIS — I420 Dilated cardiomyopathy: Secondary | ICD-10-CM | POA: Diagnosis present

## 2021-07-01 DIAGNOSIS — I700 Atherosclerosis of aorta: Secondary | ICD-10-CM | POA: Diagnosis not present

## 2021-07-01 DIAGNOSIS — J9811 Atelectasis: Secondary | ICD-10-CM | POA: Diagnosis not present

## 2021-07-01 DIAGNOSIS — K8020 Calculus of gallbladder without cholecystitis without obstruction: Secondary | ICD-10-CM | POA: Diagnosis not present

## 2021-07-01 DIAGNOSIS — R0689 Other abnormalities of breathing: Secondary | ICD-10-CM | POA: Diagnosis not present

## 2021-07-01 DIAGNOSIS — L02416 Cutaneous abscess of left lower limb: Secondary | ICD-10-CM | POA: Diagnosis not present

## 2021-07-01 DIAGNOSIS — R1312 Dysphagia, oropharyngeal phase: Secondary | ICD-10-CM | POA: Diagnosis not present

## 2021-07-01 DIAGNOSIS — M6281 Muscle weakness (generalized): Secondary | ICD-10-CM | POA: Diagnosis not present

## 2021-07-01 DIAGNOSIS — R000 Tachycardia, unspecified: Secondary | ICD-10-CM | POA: Diagnosis not present

## 2021-07-01 DIAGNOSIS — Z91013 Allergy to seafood: Secondary | ICD-10-CM

## 2021-07-01 DIAGNOSIS — I70222 Atherosclerosis of native arteries of extremities with rest pain, left leg: Secondary | ICD-10-CM | POA: Diagnosis not present

## 2021-07-01 DIAGNOSIS — Z7984 Long term (current) use of oral hypoglycemic drugs: Secondary | ICD-10-CM | POA: Diagnosis not present

## 2021-07-01 DIAGNOSIS — L03116 Cellulitis of left lower limb: Secondary | ICD-10-CM | POA: Diagnosis present

## 2021-07-01 DIAGNOSIS — I70202 Unspecified atherosclerosis of native arteries of extremities, left leg: Secondary | ICD-10-CM | POA: Diagnosis not present

## 2021-07-01 DIAGNOSIS — Z743 Need for continuous supervision: Secondary | ICD-10-CM | POA: Diagnosis not present

## 2021-07-01 LAB — CBC WITH DIFFERENTIAL/PLATELET
Abs Immature Granulocytes: 0 10*3/uL (ref 0.00–0.07)
Basophils Absolute: 0 10*3/uL (ref 0.0–0.1)
Basophils Relative: 0 %
Eosinophils Absolute: 0 10*3/uL (ref 0.0–0.5)
Eosinophils Relative: 0 %
HCT: 35.5 % — ABNORMAL LOW (ref 39.0–52.0)
Hemoglobin: 10.6 g/dL — ABNORMAL LOW (ref 13.0–17.0)
Lymphocytes Relative: 3 %
Lymphs Abs: 0.6 10*3/uL — ABNORMAL LOW (ref 0.7–4.0)
MCH: 21.9 pg — ABNORMAL LOW (ref 26.0–34.0)
MCHC: 29.9 g/dL — ABNORMAL LOW (ref 30.0–36.0)
MCV: 73.2 fL — ABNORMAL LOW (ref 80.0–100.0)
Monocytes Absolute: 0.8 10*3/uL (ref 0.1–1.0)
Monocytes Relative: 4 %
Neutro Abs: 18.3 10*3/uL — ABNORMAL HIGH (ref 1.7–7.7)
Neutrophils Relative %: 93 %
Platelets: 236 10*3/uL (ref 150–400)
RBC: 4.85 MIL/uL (ref 4.22–5.81)
RDW: 22.5 % — ABNORMAL HIGH (ref 11.5–15.5)
WBC: 19.7 10*3/uL — ABNORMAL HIGH (ref 4.0–10.5)
nRBC: 0 % (ref 0.0–0.2)
nRBC: 0 /100 WBC

## 2021-07-01 LAB — APTT: aPTT: 42 seconds — ABNORMAL HIGH (ref 24–36)

## 2021-07-01 LAB — COMPREHENSIVE METABOLIC PANEL
ALT: 11 U/L (ref 0–44)
ALT: 13 U/L (ref 0–44)
AST: 26 U/L (ref 15–41)
AST: 31 U/L (ref 15–41)
Albumin: 2 g/dL — ABNORMAL LOW (ref 3.5–5.0)
Albumin: 2.1 g/dL — ABNORMAL LOW (ref 3.5–5.0)
Alkaline Phosphatase: 96 U/L (ref 38–126)
Alkaline Phosphatase: 91 U/L (ref 38–126)
Anion gap: 10 (ref 5–15)
Anion gap: 8 (ref 5–15)
BUN: 35 mg/dL — ABNORMAL HIGH (ref 8–23)
BUN: 33 mg/dL — ABNORMAL HIGH (ref 8–23)
CO2: 23 mmol/L (ref 22–32)
CO2: 24 mmol/L (ref 22–32)
Calcium: 8.6 mg/dL — ABNORMAL LOW (ref 8.9–10.3)
Calcium: 8.5 mg/dL — ABNORMAL LOW (ref 8.9–10.3)
Chloride: 107 mmol/L (ref 98–111)
Chloride: 108 mmol/L (ref 98–111)
Creatinine, Ser: 1.79 mg/dL — ABNORMAL HIGH (ref 0.61–1.24)
Creatinine, Ser: 1.6 mg/dL — ABNORMAL HIGH (ref 0.61–1.24)
GFR, Estimated: 39 mL/min — ABNORMAL LOW (ref >60)
GFR, Estimated: 44 mL/min — ABNORMAL LOW (ref >60)
Glucose, Bld: 114 mg/dL — ABNORMAL HIGH (ref 70–99)
Glucose, Bld: 113 mg/dL — ABNORMAL HIGH (ref 70–99)
Potassium: 4.2 mmol/L (ref 3.5–5.1)
Potassium: 4.4 mmol/L (ref 3.5–5.1)
Sodium: 140 mmol/L (ref 135–145)
Sodium: 140 mmol/L (ref 135–145)
Total Bilirubin: 0.9 mg/dL (ref 0.3–1.2)
Total Bilirubin: 1.1 mg/dL (ref 0.3–1.2)
Total Protein: 5.9 g/dL — ABNORMAL LOW (ref 6.5–8.1)
Total Protein: 6.3 g/dL — ABNORMAL LOW (ref 6.5–8.1)

## 2021-07-01 LAB — URINALYSIS, ROUTINE W REFLEX MICROSCOPIC
Bilirubin Urine: NEGATIVE
Glucose, UA: 150 mg/dL — AB
Ketones, ur: NEGATIVE mg/dL
Nitrite: NEGATIVE
Protein, ur: 100 mg/dL — AB
RBC / HPF: >50 RBC/hpf — ABNORMAL HIGH (ref 0–5)
Specific Gravity, Urine: 1.021 (ref 1.005–1.030)
WBC, UA: >50 WBC/hpf — ABNORMAL HIGH (ref 0–5)
pH: 5 (ref 5.0–8.0)

## 2021-07-01 LAB — PROTIME-INR
INR: 2.5 — ABNORMAL HIGH (ref 0.8–1.2)
Prothrombin Time: 27.2 seconds — ABNORMAL HIGH (ref 11.4–15.2)

## 2021-07-01 LAB — RESP PANEL BY RT-PCR (FLU A&B, COVID) ARPGX2
Influenza A by PCR: NEGATIVE
Influenza B by PCR: NEGATIVE
SARS Coronavirus 2 by RT PCR: NEGATIVE

## 2021-07-01 LAB — LACTIC ACID, PLASMA
Lactic Acid, Venous: 1.7 mmol/L (ref 0.5–1.9)
Lactic Acid, Venous: 2 mmol/L (ref 0.5–1.9)
Lactic Acid, Venous: 1.5 mmol/L (ref 0.5–1.9)

## 2021-07-01 MED ORDER — LACTATED RINGERS IV SOLN: INTRAVENOUS | Status: AC

## 2021-07-01 MED ORDER — LACTATED RINGERS IV BOLUS (SEPSIS)
500.0000 mL | Freq: Once | INTRAVENOUS | Status: AC
  Administered 2021-07-01: 500 mL via INTRAVENOUS

## 2021-07-01 MED ORDER — ACETAMINOPHEN 325 MG PO TABS
650.0000 mg | ORAL_TABLET | Freq: Four times a day (QID) | ORAL | Status: DC | PRN
  Administered 2021-07-02 – 2021-07-13 (×11): 650 mg via ORAL
  Filled 2021-07-01 (×12): qty 2

## 2021-07-01 MED ORDER — SODIUM CHLORIDE 0.9 % IV SOLN
2.0000 g | Freq: Once | INTRAVENOUS | Status: AC
  Administered 2021-07-01: 2 g via INTRAVENOUS
  Filled 2021-07-01: qty 2

## 2021-07-01 MED ORDER — LACTATED RINGERS IV BOLUS (SEPSIS)
1000.0000 mL | Freq: Once | INTRAVENOUS | Status: AC
  Administered 2021-07-01: 1000 mL via INTRAVENOUS

## 2021-07-01 MED ORDER — VANCOMYCIN HCL 1500 MG/300ML IV SOLN
1500.0000 mg | Freq: Once | INTRAVENOUS | Status: AC
  Administered 2021-07-01: 1500 mg via INTRAVENOUS
  Filled 2021-07-01: qty 300

## 2021-07-01 MED ORDER — METRONIDAZOLE 500 MG/100ML IV SOLN
500.0000 mg | Freq: Once | INTRAVENOUS | Status: AC
  Administered 2021-07-01: 500 mg via INTRAVENOUS
  Filled 2021-07-01: qty 100

## 2021-07-01 MED ORDER — IBUPROFEN 400 MG PO TABS
600.0000 mg | ORAL_TABLET | Freq: Once | ORAL | Status: AC
  Administered 2021-07-01: 600 mg via ORAL
  Filled 2021-07-01: qty 1

## 2021-07-01 MED ORDER — MIDODRINE HCL 5 MG PO TABS
10.0000 mg | ORAL_TABLET | Freq: Three times a day (TID) | ORAL | Status: DC
  Administered 2021-07-02 – 2021-07-14 (×38): 10 mg via ORAL
  Filled 2021-07-01 (×39): qty 2

## 2021-07-01 MED ORDER — SODIUM CHLORIDE 0.9 % IV SOLN: 2.0000 g | Freq: Two times a day (BID) | INTRAVENOUS | Status: DC

## 2021-07-01 MED ORDER — METRONIDAZOLE 500 MG/100ML IV SOLN
500.0000 mg | Freq: Two times a day (BID) | INTRAVENOUS | Status: AC
Start: 2021-07-02 — End: 2021-07-08
  Administered 2021-07-02 – 2021-07-08 (×14): 500 mg via INTRAVENOUS
  Filled 2021-07-01 (×14): qty 100

## 2021-07-01 MED ORDER — LACTATED RINGERS IV SOLN: INTRAVENOUS | Status: DC

## 2021-07-01 MED ORDER — VANCOMYCIN HCL IN DEXTROSE 1-5 GM/200ML-% IV SOLN: 1000.0000 mg | INTRAVENOUS | Status: DC

## 2021-07-01 MED ORDER — ONDANSETRON HCL 4 MG/2ML IJ SOLN: 4.0000 mg | Freq: Four times a day (QID) | INTRAMUSCULAR | Status: DC | PRN

## 2021-07-01 MED ORDER — INSULIN ASPART 100 UNIT/ML IJ SOLN
0.0000 [IU] | Freq: Four times a day (QID) | INTRAMUSCULAR | Status: DC
  Administered 2021-07-08 – 2021-07-10 (×2): 1 [IU] via SUBCUTANEOUS

## 2021-07-01 MED ORDER — APIXABAN 5 MG PO TABS
5.0000 mg | ORAL_TABLET | Freq: Two times a day (BID) | ORAL | Status: DC
  Administered 2021-07-02 – 2021-07-03 (×3): 5 mg via ORAL
  Filled 2021-07-01 (×3): qty 1

## 2021-07-01 MED ORDER — ACETAMINOPHEN 650 MG RE SUPP: 650.0000 mg | Freq: Four times a day (QID) | RECTAL | Status: DC | PRN

## 2021-07-01 MED ORDER — AMIODARONE HCL 200 MG PO TABS
200.0000 mg | ORAL_TABLET | Freq: Every day | ORAL | Status: DC
  Administered 2021-07-02 – 2021-07-14 (×13): 200 mg via ORAL
  Filled 2021-07-01 (×13): qty 1

## 2021-07-01 MED ORDER — ONDANSETRON HCL 4 MG PO TABS: 4.0000 mg | ORAL_TABLET | Freq: Four times a day (QID) | ORAL | Status: DC | PRN

## 2021-07-01 NOTE — Assessment & Plan Note (Signed)
Continue with broad spectrum abx with cefepime, vanco, flagyl. Pt cultured prior to abx. Check procalcitonin. Source of his sepsis not yet identified. Could be his urine vs his ischemic left foot.

## 2021-07-01 NOTE — Assessment & Plan Note (Signed)
Due to altered mental status, keep NPO. SSI q6h.

## 2021-07-01 NOTE — Assessment & Plan Note (Signed)
On eliquis

## 2021-07-01 NOTE — H&P (Signed)
History and Physical    Ryan [name]:[id number] DOB: [birth year] DOA: 07/01/2021  PCP: [name], MD   Patient coming from: SNF  I have personally briefly reviewed patient's old medical records in [place]  CC: altered mental status HPI:  75yo AAM with hx of chronic systolic CHF EF 23%, HTN, DM2, CKD stage 3, PVD s/p right AKA, ischemic left leg needing amputation but family refusing, hx of PE on Eliquis, hx of stroke, hx of retroperitoneal hematoma requiring left-sided nephrostomy tube due to urinary obstruction, who Presents to ER from SNF due to reported altered mental status.  Due to altered mental status, patient cannot give any history or review of systems.  Patient noted to be febrile to 102.3 on admission.  Code sepsis was started.  Patient received 30 cc/kg of IV fluids.  Lab evaluation showed a white count of 19.7 hemoglobin 10.6 platelets of 236.  COVID swab was negative.  Lactic acid was elevated 2.0.  Chemistry showed a sodium 140 BUN of 35 creatinine 1.79.  Baseline creatinine approximately 1.1-1.2.  Due to sepsis, aki, altered mental status, [facility] hospital[facility] contacted for admission.   ED Course: febrile on admission to ER. Code sepsis started. Pt given 30 ml/kg IVF. Blood cx obtained. IV abx started.  Review of Systems:  Review of Systems  Unable to perform ROS: Mental status change   Past Medical History:  Diagnosis Date   Acute pericarditis 09/16/2012   Possible purulent pericarditis in setting of staph bacteremia   AICD (automatic cardioverter/defibrillator) present    Arthritis    Atrial fibrillation (HCC)    Cardiomyopathy, dilated (HCC)    CHF (congestive heart failure) (HCC)    Diabetes mellitus without complication ([place])    DVT of left external iliac vein 04/2021 06/06/2021   Endocarditis    Hypertension    Peripheral vascular disease ([place])    Pulmonary emboli 04/2021 06/06/2021   PVC's (premature ventricular contractions)    SVT  (supraventricular tachycardia)  long RP     Past Surgical History:  Procedure Laterality Date   ABDOMINAL AORTAGRAM N/A 10/03/2012   Procedure: ABDOMINAL Maxcine Ham;  Surgeon: [name], MD;  Location: [facility] CATH LAB;  Service: Cardiovascular;  Laterality: N/A;   ABDOMINAL AORTOGRAM W/LOWER EXTREMITY N/A 02/25/2021   Procedure: ABDOMINAL AORTOGRAM W/LOWER EXTREMITY;  Surgeon: Cherre Robins, MD;  Location: [place] CV LAB;  Service: Cardiovascular;  Laterality: N/A;   ABDOMINAL AORTOGRAM W/LOWER EXTREMITY N/A 04/13/2021   Procedure: ABDOMINAL AORTOGRAM W/LOWER EXTREMITY;  Surgeon: Waynetta Sandy, MD;  Location: [place] CV LAB;  Service: Cardiovascular;  Laterality: N/A;   AMPUTATION Right 05/12/2021   Procedure: AMPUTATION ABOVE KNEE RIGHT;  Surgeon: Angelia Mould, MD;  Location: [facility] OR;  Service: Vascular;  Laterality: Right;   APPLICATION OF WOUND VAC Right 04/09/2021   Procedure: APPLICATION OF WOUND VAC;  Surgeon: Cherre Robins, MD;  Location: [place];  Service: Vascular;  Laterality: Right;   BYPASS GRAFT FEMORAL-PERONEAL Right 03/06/2021   Procedure: RIGHT ABOVE KNEE POPLITEAL ARTERY-PERONEAL BYPASS;  Surgeon: Cherre Robins, MD;  Location: [place];  Service: Vascular;  Laterality: Right;   CORONARY ANGIOGRAM  09/21/2012   Procedure: CORONARY ANGIOGRAM;  Surgeon: [name], MD;  Location: [facility] CATH LAB;  Service: Cardiovascular;;   EP IMPLANTABLE DEVICE N/A 06/11/2016   Procedure: ICD Implant;  Surgeon: Will Meredith Leeds, MD;  Location: [place] CV LAB;  Service: Cardiovascular;  Laterality: N/A;   EXTREMITY  WIRE/PIN REMOVAL  09/14/2012   Procedure: REMOVAL K-WIRE/PIN EXTREMITY;  Surgeon: [name], MD;  Location: [place];  Service: Orthopedics;  Laterality: Right;  Right Foot   I & D EXTREMITY  09/14/2012   Procedure: IRRIGATION AND DEBRIDEMENT EXTREMITY;  Surgeon: Tennis Must, MD;  Location: [place];  Service: Orthopedics;  Laterality: Right;    INTRAOPERATIVE TRANSESOPHAGEAL ECHOCARDIOGRAM  09/26/2012   Procedure: INTRAOPERATIVE TRANSESOPHAGEAL ECHOCARDIOGRAM;  Surgeon: [name], MD;  Location: [facility] OR;  Service: Open Heart Surgery;  Laterality: N/A;   IR FLUORO GUIDE CV LINE RIGHT  04/20/2021   IR NEPHROSTOMY EXCHANGE LEFT  06/07/2021   IR NEPHROSTOMY PLACEMENT LEFT  04/20/2021   IR US GUIDE VASC ACCESS RIGHT  04/20/2021   MITRAL VALVE REPLACEMENT  09/26/2012   Procedure: MITRAL VALVE (MV) REPLACEMENT;  Surgeon: [name], MD;  Location: [place];  Service: Open Heart Surgery;  Laterality: N/A;   RIGHT HEART CATH N/A 08/29/2020   Procedure: RIGHT HEART CATH;  Surgeon: Larey Dresser, MD;  Location: [place] CV LAB;  Service: Cardiovascular;  Laterality: N/A;   RIGHT HEART CATHETERIZATION  09/21/2012   Procedure: RIGHT HEART CATH;  Surgeon: [name], MD;  Location: [facility] CATH LAB;  Service: Cardiovascular;;   RIGHT/LEFT HEART CATH AND CORONARY ANGIOGRAPHY N/A 01/30/2021   Procedure: RIGHT/LEFT HEART CATH AND CORONARY ANGIOGRAPHY;  Surgeon: Larey Dresser, MD;  Location: [place] CV LAB;  Service: Cardiovascular;  Laterality: N/A;   SVT ABLATION N/A 09/03/2020   Procedure: SVT ABLATION;  Surgeon: Constance Haw, MD;  Location: [place] CV LAB;  Service: Cardiovascular;  Laterality: N/A;   TEE WITHOUT CARDIOVERSION  09/18/2012   Procedure: TRANSESOPHAGEAL ECHOCARDIOGRAM (TEE);  Surgeon: [name], MD;  Location: [facility] ENDOSCOPY;  Service: Cardiovascular;  Laterality: N/A;   WOUND DEBRIDEMENT Right 04/09/2021   Procedure: DEBRIDEMENT RIGHT HEEL WOUND AND PARTIAL FIRST TOE AMPUTATION AND SECOND TOE AMPUTATION. Application of Myriad skin substitute;  Surgeon: Cherre Robins, MD;  Location: [place];  Service: Vascular;  Laterality: Right;     reports that he has never smoked. He has never used smokeless tobacco. He reports that he does not drink alcohol and does not use drugs.  Allergies  Allergen Reactions   Geralyn Flash [Fish  Allergy] Nausea And Vomiting    Family History  Problem Relation Age of Onset   Hypertension Mother    Hypertension Father     Prior to Admission medications   Medication Sig Start Date End Date Taking? Authorizing Provider  acetaminophen (TYLENOL) 325 MG tablet Take 650 mg by mouth every 6 (six) hours as needed for mild pain or headache.   Yes [provider]  Amino Acids-Protein Hydrolys (FEEDING SUPPLEMENT, PRO-STAT SUGAR FREE 64,) LIQD Take 30 mLs by mouth 3 (three) times daily. Take with 4 oz juice   Yes [provider]  amiodarone (PACERONE) 200 MG tablet TAKE 1 TABLET BY MOUTH EVERY DAY Patient taking differently: Take 200 mg by mouth daily. 03/03/21  Yes Bhagat, Bhavinkumar, PA  apixaban (ELIQUIS) 5 MG TABS tablet Take 1 tablet (5 mg total) by mouth every 12 (twelve) hours. 05/22/21  Yes [place] Denman Pichardo J, DO  atorvastatin (LIPITOR) 40 MG tablet Take 1 tablet (40 mg total) by mouth daily at 6 PM. Patient taking differently: Take 40 mg by mouth at bedtime. 10/06/12  Yes Timmothy Euler, MD  bisacodyl (DULCOLAX) 5 MG EC tablet Take 5 mg by mouth daily as  needed (constipation).   Yes [provider]  docusate sodium (COLACE) 100 MG capsule Take 100 mg by mouth daily.   Yes [provider]  empagliflozin (JARDIANCE) 10 MG TABS tablet Take 1 tablet (10 mg total) by mouth daily. 11/11/20  Yes Larey Dresser, MD  Ensure (ENSURE) Take 237 mLs by mouth in the morning, at noon, and at bedtime.   Yes [provider]  furosemide (LASIX) 40 MG tablet Take 40 mg by mouth daily.   Yes [provider]  gabapentin (NEURONTIN) 300 MG capsule Take 300 mg by mouth at bedtime.   Yes [provider]  guaiFENesin (ROBITUSSIN) 100 MG/5ML SOLN Take 100 mg by mouth every 4 (four) hours as needed for cough.   Yes [provider]  HYDROcodone-acetaminophen (NORCO) 7.5-325 MG tablet Take 1 tablet by mouth every 6 (six) hours as needed for  severe pain (in left foot).   Yes [provider]  midodrine (PROAMATINE) 10 MG tablet Take 1 tablet (10 mg total) by mouth 3 (three) times daily with meals. 05/22/21  Yes [place] Aissa Lisowski J, DO  pantoprazole (PROTONIX) 40 MG tablet Take 40 mg by mouth daily. 11/12/14  Yes [provider]  triamcinolone cream (KENALOG) 0.5 % Apply 1 application topically daily as needed (skin rash).  02/15/14  Yes [provider]    Physical Exam: Vitals:   07/01/21 1854 07/01/21 1945 07/01/21 2000 07/01/21 2015  BP: 126/63 135/86 124/83 113/67  Pulse: 80 85 83 84  Resp: (!) $RemoveB'21 17 18 18  '[id number]$ Temp:      TempSrc:      SpO2: 99% 98% 100% 100%    Physical Exam Vitals and nursing note reviewed.  Constitutional:      General: He is not in acute distress.    Appearance: He is ill-appearing.     Comments: Appears chronically ill Unarousable for this writer  HENT:     Head: Normocephalic.  Eyes:     General:        Right eye: Discharge present.        Left eye: Discharge present. Cardiovascular:     Rate and Rhythm: Normal rate and regular rhythm.     Heart sounds: Murmur heard.  Pulmonary:     Effort: No respiratory distress.     Breath sounds: No wheezing or rales.  Abdominal:     General: There is no distension.     Tenderness: There is no abdominal tenderness.  Skin:    Comments: Lower leg leg and left foot appears chronically ischemic. No palpable pulses felt in left foot. See picture.  Right AKA stump.  Neurological:     Comments: unarousable     Labs on Admission: I have personally reviewed following labs and imaging studies  CBC: Recent Labs  Lab 07/01/21 1508  WBC 19.7*  NEUTROABS 18.3*  HGB 10.6*  HCT 35.5*  MCV 73.2*  PLT 109   Basic Metabolic Panel: Recent Labs  Lab 07/01/21 1625  NA 140  K 4.2  CL 107  CO2 23  GLUCOSE 114*  BUN 35*  CREATININE 1.79*  CALCIUM 8.6*   GFR: CrCl cannot be calculated (Unknown ideal weight.). Liver Function  Tests: Recent Labs  Lab 07/01/21 1625  AST 26  ALT 11  ALKPHOS 96  BILITOT 0.9  PROT 5.9*  ALBUMIN 2.0*   No results for input(s): LIPASE, AMYLASE in the last 168 hours. No results for input(s): AMMONIA in the last 168 hours.  Coagulation Profile: No results for input(s): INR, PROTIME in the last 168 hours. Cardiac Enzymes: No results for input(s): CKTOTAL, CKMB, CKMBINDEX, TROPONINI in the last 168 hours. BNP (last 3 results) Recent Labs    08/25/20 1121 03/27/21 1123  PROBNP 21,395* 3,146*   HbA1C: No results for input(s): HGBA1C in the last 72 hours. CBG: No results for input(s): GLUCAP in the last 168 hours. Lipid Profile: No results for input(s): CHOL, HDL, LDLCALC, TRIG, CHOLHDL, LDLDIRECT in the last 72 hours. Thyroid Function Tests: No results for input(s): TSH, T4TOTAL, FREET4, T3FREE, THYROIDAB in the last 72 hours. Anemia Panel: No results for input(s): VITAMINB12, FOLATE, FERRITIN, TIBC, IRON, RETICCTPCT in the last 72 hours. Urine analysis:    Component Value Date/Time   COLORURINE AMBER (A) 07/01/2021 1505   APPEARANCEUR CLOUDY (A) 07/01/2021 1505   LABSPEC 1.021 07/01/2021 1505   PHURINE 5.0 07/01/2021 1505   GLUCOSEU 150 (A) 07/01/2021 1505   HGBUR LARGE (A) 07/01/2021 1505   BILIRUBINUR NEGATIVE 07/01/2021 [place] 07/01/2021 1505   PROTEINUR 100 (A) 07/01/2021 1505   UROBILINOGEN 1.0 09/12/2012 2055   NITRITE NEGATIVE 07/01/2021 1505   LEUKOCYTESUR MODERATE (A) 07/01/2021 1505    Radiological Exams on Admission: I have personally reviewed images DG Chest Port 1 View  Result Date: 07/01/2021 CLINICAL DATA:  Questionable sepsis. EXAM: PORTABLE CHEST 1 VIEW COMPARISON:  May 11, 2021 FINDINGS: Multiple sternal wires are seen. A dual lead AICD is noted. The nasogastric tube seen on the prior study has been removed. Mild atelectasis is seen within the bilateral lung bases. There is no evidence of a pleural effusion or pneumothorax.  The heart size and mediastinal contours are within normal limits. There is marked severity calcification of the aortic arch. A radiopaque catheter/stent is seen overlying the left upper quadrant. The visualized skeletal structures are unremarkable. IMPRESSION: 1. Evidence of prior median sternotomy/CABG. 2. Mild bibasilar atelectasis. Electronically Signed   By: Virgina Norfolk M.D.   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 Continue with gentle IVF. Pt received 30 ml/kg IVF due to code sepsis.  Sepsis with acute renal failure without septic shock ([place]) Continue with broad spectrum abx with cefepime, vanco, flagyl. Pt cultured prior to abx. Check procalcitonin. Source of his sepsis not yet identified. Could be his urine vs his ischemic left foot.  AKI (acute kidney injury) ([place]) Hold all nephrotoxic agents. Continue with gentle IVF. Repeat BMP. EDP ordering non-contrast CT to evaluate for hydronephrosis or dislodged nephrostomy tube.  PAD (peripheral artery disease) ([place]) According to telephone note dated 06-29-2021, family is now willing to proceed with left AKA. May need vascular surgery to see patient during hospitalization.  Chronic systolic heart failure (HCC) - LVEF 25 to 30% Does not appear volume overloaded but pt did receive 30 ml/kg IVF due to code sepsis. Follow closely.  Type 2 diabetes mellitus (HCC) Due to  altered mental status, keep  NPO. SSI q6h.  Pulmonary emboli 04/2021 On eliquis.  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 EDP checking CT abd to ensure nephrostomy tube is still in place.  Essential hypertension Was initially hypotensive. Received 30 ml/kg IVF. Continue midodrine. Hold lasix.  Stage 3 chronic kidney disease (HCC) - baseline SCr 1.1 Acute exacerbated. Baseline Scr 1.1. continue with gentle IVf. Hold nephrotoxic agents.  DVT prophylaxis: Eliquis Code Status: Full Code by default Family Communication: no family at bedside  Disposition Plan: return to SNF  Consults called: none  Admission status: Inpatient, Med-Surg   Kristopher Oppenheim, DO [facility] 07/01/2021, 8:32 PM

## 2021-07-01 NOTE — Assessment & Plan Note (Signed)
Pt has nephrostomy tube replaced on 06-07-2021 after he pulled it out. EDP checking CT abd to ensure nephrostomy tube is still in place.

## 2021-07-01 NOTE — ED Provider Notes (Signed)
[facility] EMERGENCY DEPARTMENT Provider Note   CSN: [id number] Arrival date & time: 07/01/21  1452     History Chief Complaint  Patient presents with   Altered Mental Status    Ryan [name] is a 75 y.o. male.  Pt presents to the ED today with AMS.  Pt is from [employer] and staff called EMS for unresponsive.  Pt had a fever of 104.9.  The SNF staff gave him a vicodin because it had tylenol in it.  EMS gave pt 650 more mg tylenol en route.  Pt was also given 500 cc NS.  Pt is a poor historian.  Pt has a known chronic wound to the left foot.  He has seen vascular surgery and there is a plan for an above the knee amputation.  Pt also has a left sided nephrostomy tube which is draining discolored urine.      Past Medical History:  Diagnosis Date   AICD (automatic cardioverter/defibrillator) present    Arthritis    Atrial fibrillation ([place])    Cardiomyopathy, dilated (HCC)    CHF (congestive heart failure) ([place])    Diabetes mellitus without complication ([place])    DVT of left external iliac vein 04/2021 06/06/2021   Endocarditis    Hypertension    Peripheral vascular disease ([place])    Pulmonary emboli 04/2021 06/06/2021   PVC's (premature ventricular contractions)    SVT (supraventricular tachycardia)  long RP     Patient Active Problem List   Diagnosis Date Noted   Multiple pulmonary nodules 65/79/0383   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[place]) 06/06/2021   Normocytic anemia 06/06/2021   DVT of left external iliac vein 04/2021 06/06/2021   Pulmonary emboli 04/2021 06/06/2021   Pressure injury of skin 04/22/2021   Popliteal artery occlusion, 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   PAD (peripheral artery disease) ([place]) 03/06/2021   Acute on chronic combined systolic and diastolic CHF (congestive heart failure) ([place]) 09/06/2020   CHF (congestive heart failure) ([place]) 08/26/2020   Acute combined systolic and diastolic CHF, NYHA class 3 ([place])    Stage 3 chronic kidney  disease ([place])    Onychomycosis of toenail 04/23/2020   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   PVC's (premature ventricular contractions) 06/12/2016   Junctional tachycardia ([place]) 09/16/2015   SVT (supraventricular tachycardia)  long RP    Atherosclerosis of native arteries of the extremities with ulceration ([place]) 10/23/2012   Essential hypertension 09/29/2012    Class: Chronic   Type 2 diabetes mellitus ([place]) 09/28/2012   Status post mitral valve replacement with bioprosthetic valve 09/19/2012    Class: Acute   Acute pericarditis 09/16/2012    Class: Acute   Chronic systolic heart failure ([place]) 09/16/2012    Class: Acute   Peripheral vascular disease ([place]) 09/15/2012   Acute ischemic stroke ([place]) 09/15/2012    Past Surgical History:  Procedure Laterality Date   ABDOMINAL AORTAGRAM N/A 10/03/2012   Procedure: ABDOMINAL Maxcine Ham;  Surgeon: [name], MD;  Location: [facility] CATH LAB;  Service: Cardiovascular;  Laterality: N/A;   ABDOMINAL AORTOGRAM W/LOWER EXTREMITY N/A 02/25/2021   Procedure: ABDOMINAL AORTOGRAM W/LOWER EXTREMITY;  Surgeon: Cherre Robins, MD;  Location: [place] CV LAB;  Service: Cardiovascular;  Laterality: N/A;   ABDOMINAL AORTOGRAM W/LOWER EXTREMITY N/A 04/13/2021   Procedure: ABDOMINAL AORTOGRAM W/LOWER EXTREMITY;  Surgeon: Waynetta Sandy, MD;  Location: [place] CV LAB;  Service: Cardiovascular;  Laterality: N/A;   AMPUTATION Right 05/12/2021   Procedure: AMPUTATION ABOVE  KNEE RIGHT;  Surgeon: Angelia Mould, MD;  Location: [facility] OR;  Service: Vascular;  Laterality: Right;   APPLICATION OF WOUND VAC Right 04/09/2021   Procedure: APPLICATION OF WOUND VAC;  Surgeon: Cherre Robins, MD;  Location: [place];  Service: Vascular;  Laterality: Right;   BYPASS GRAFT FEMORAL-PERONEAL Right 03/06/2021   Procedure: RIGHT ABOVE KNEE POPLITEAL ARTERY-PERONEAL BYPASS;  Surgeon: Cherre Robins, MD;  Location: [place];  Service:  Vascular;  Laterality: Right;   CORONARY ANGIOGRAM  09/21/2012   Procedure: CORONARY ANGIOGRAM;  Surgeon: [name], MD;  Location: [facility] CATH LAB;  Service: Cardiovascular;;   EP IMPLANTABLE DEVICE N/A 06/11/2016   Procedure: ICD Implant;  Surgeon: Will Meredith Leeds, MD;  Location: [place] CV LAB;  Service: Cardiovascular;  Laterality: N/A;   EXTREMITY WIRE/PIN REMOVAL  09/14/2012   Procedure: REMOVAL K-WIRE/PIN EXTREMITY;  Surgeon: [name], MD;  Location: [facility];  Service: Orthopedics;  Laterality: Right;  Right Foot   I & D EXTREMITY  09/14/2012   Procedure: IRRIGATION AND DEBRIDEMENT EXTREMITY;  Surgeon: Tennis Must, MD;  Location: [place];  Service: Orthopedics;  Laterality: Right;   INTRAOPERATIVE TRANSESOPHAGEAL ECHOCARDIOGRAM  09/26/2012   Procedure: INTRAOPERATIVE TRANSESOPHAGEAL ECHOCARDIOGRAM;  Surgeon: [name], MD;  Location: [facility] OR;  Service: Open Heart Surgery;  Laterality: N/A;   IR FLUORO GUIDE CV LINE RIGHT  04/20/2021   IR NEPHROSTOMY EXCHANGE LEFT  06/07/2021   IR NEPHROSTOMY PLACEMENT LEFT  04/20/2021   IR US GUIDE VASC ACCESS RIGHT  04/20/2021   MITRAL VALVE REPLACEMENT  09/26/2012   Procedure: MITRAL VALVE (MV) REPLACEMENT;  Surgeon: [name], MD;  Location: [place];  Service: Open Heart Surgery;  Laterality: N/A;   RIGHT HEART CATH N/A 08/29/2020   Procedure: RIGHT HEART CATH;  Surgeon: Larey Dresser, MD;  Location: [place] CV LAB;  Service: Cardiovascular;  Laterality: N/A;   RIGHT HEART CATHETERIZATION  09/21/2012   Procedure: RIGHT HEART CATH;  Surgeon: [name], MD;  Location: [facility] CATH LAB;  Service: Cardiovascular;;   RIGHT/LEFT HEART CATH AND CORONARY ANGIOGRAPHY N/A 01/30/2021   Procedure: RIGHT/LEFT HEART CATH AND CORONARY ANGIOGRAPHY;  Surgeon: Larey Dresser, MD;  Location: [place] CV LAB;  Service: Cardiovascular;  Laterality: N/A;   SVT ABLATION N/A 09/03/2020   Procedure: SVT ABLATION;  Surgeon: Constance Haw, MD;   Location: [place] CV LAB;  Service: Cardiovascular;  Laterality: N/A;   TEE WITHOUT CARDIOVERSION  09/18/2012   Procedure: TRANSESOPHAGEAL ECHOCARDIOGRAM (TEE);  Surgeon: [name], MD;  Location: [facility] ENDOSCOPY;  Service: Cardiovascular;  Laterality: N/A;   WOUND DEBRIDEMENT Right 04/09/2021   Procedure: DEBRIDEMENT RIGHT HEEL WOUND AND PARTIAL FIRST TOE AMPUTATION AND SECOND TOE AMPUTATION. Application of Myriad skin substitute;  Surgeon: Cherre Robins, MD;  Location: [facility] OR;  Service: Vascular;  Laterality: Right;       Family History  Problem Relation Age of Onset   Hypertension Mother    Hypertension Father     Social History   Tobacco Use   Smoking status: Never   Smokeless tobacco: Never  Vaping Use   Vaping Use: Never used  Substance Use Topics   Alcohol use: No    Alcohol/week: 0.0 standard drinks   Drug use: No    Home Medications Prior to Admission medications   Medication Sig Start Date End Date Taking? Authorizing Provider  acetaminophen (TYLENOL) 325 MG tablet Take 650 mg by  mouth every 6 (six) hours as needed for mild pain or headache.   Yes [provider]  Amino Acids-Protein Hydrolys (FEEDING SUPPLEMENT, PRO-STAT SUGAR FREE 64,) LIQD Take 30 mLs by mouth 3 (three) times daily. Take with 4 oz juice   Yes [provider]  amiodarone (PACERONE) 200 MG tablet TAKE 1 TABLET BY MOUTH EVERY DAY Patient taking differently: Take 200 mg by mouth daily. 03/03/21  Yes Bhagat, Bhavinkumar, PA  apixaban (ELIQUIS) 5 MG TABS tablet Take 1 tablet (5 mg total) by mouth every 12 (twelve) hours. 05/22/21  Yes [place] Eric J, DO  atorvastatin (LIPITOR) 40 MG tablet Take 1 tablet (40 mg total) by mouth daily at 6 PM. Patient taking differently: Take 40 mg by mouth at bedtime. 10/06/12  Yes Timmothy Euler, MD  bisacodyl (DULCOLAX) 5 MG EC tablet Take 5 mg by mouth daily as needed (constipation).   Yes [provider]  docusate sodium (COLACE) 100 MG  capsule Take 100 mg by mouth daily.   Yes [provider]  empagliflozin (JARDIANCE) 10 MG TABS tablet Take 1 tablet (10 mg total) by mouth daily. 11/11/20  Yes Larey Dresser, MD  Ensure (ENSURE) Take 237 mLs by mouth in the morning, at noon, and at bedtime.   Yes [provider]  furosemide (LASIX) 40 MG tablet Take 40 mg by mouth daily.   Yes [provider]  gabapentin (NEURONTIN) 300 MG capsule Take 300 mg by mouth at bedtime.   Yes [provider]  guaiFENesin (ROBITUSSIN) 100 MG/5ML SOLN Take 100 mg by mouth every 4 (four) hours as needed for cough.   Yes [provider]  HYDROcodone-acetaminophen (NORCO) 7.5-325 MG tablet Take 1 tablet by mouth every 6 (six) hours as needed for severe pain (in left foot).   Yes [provider]  midodrine (PROAMATINE) 10 MG tablet Take 1 tablet (10 mg total) by mouth 3 (three) times daily with meals. 05/22/21  Yes [place] Eric J, DO  pantoprazole (PROTONIX) 40 MG tablet Take 40 mg by mouth daily. 11/12/14  Yes [provider]  triamcinolone cream (KENALOG) 0.5 % Apply 1 application topically daily as needed (skin rash).  02/15/14  Yes [provider]    [place] [fish allergy]  Review of Systems   Review of Systems  Unable to perform ROS: Mental status change  All other systems reviewed and are negative.  Physical Exam Updated Vital Signs BP (!) 116/56   Pulse 90   Temp (!) 102.3 F (39.1 C) (Oral)   Resp 17   SpO2 97%   Physical Exam Vitals and nursing note reviewed.  Constitutional:      Appearance: He is ill-appearing.  HENT:     Head: Normocephalic and atraumatic.     Right Ear: External ear normal.     Left Ear: External ear normal.     Nose: Nose normal.     Mouth/Throat:     Mouth: Mucous membranes are dry.  Eyes:     Extraocular Movements: Extraocular movements intact.     Conjunctiva/sclera: Conjunctivae normal.     Pupils: Pupils are equal,  round, and reactive to light.  Cardiovascular:     Rate and Rhythm: Normal rate and regular rhythm.     Pulses: Normal pulses.     Heart sounds: Normal heart sounds.  Pulmonary:     Effort: Pulmonary effort is normal.     Breath sounds: Normal breath sounds.  Abdominal:     General: Abdomen is flat. Bowel sounds are normal.     Palpations: Abdomen is soft.  Musculoskeletal:     Cervical back: Normal range of motion and neck supple.     Comments: Right AKA Left foot with necrosis  Skin:    General: Skin is warm.     Capillary Refill: Capillary refill takes less than 2 seconds.  Neurological:     Mental Status: He is disoriented.  Psychiatric:        Mood and Affect: Mood normal.      ED Results / Procedures / Treatments   Labs (all labs ordered are listed, but only abnormal results are displayed) Labs Reviewed  CBC WITH DIFFERENTIAL/PLATELET - Abnormal; Notable for the following components:      Result Value   WBC 19.7 (*)    Hemoglobin 10.6 (*)    HCT 35.5 (*)    MCV 73.2 (*)    MCH 21.9 (*)    MCHC 29.9 (*)    RDW 22.5 (*)    All other components within normal limits  RESP PANEL BY RT-PCR (FLU A&B, COVID) ARPGX2  CULTURE, BLOOD (ROUTINE X 2)  CULTURE, BLOOD (ROUTINE X 2)  URINE CULTURE  LACTIC ACID, PLASMA  LACTIC ACID, PLASMA  COMPREHENSIVE METABOLIC PANEL  URINALYSIS, ROUTINE W REFLEX MICROSCOPIC  MISCELLANEOUS GENETIC TEST  PROTIME-INR  APTT    EKG EKG Interpretation  Date/Time:  Wednesday July 01 2021 15:13:17 EDT Ventricular Rate:  96 PR Interval:    QRS Duration: 114 QT Interval:  371 QTC Calculation: 469 R Axis:   92 Text Interpretation: Accelerated junctional rhythm Incomplete right bundle branch block Repol abnrm suggests ischemia, diffuse leads diffuse t wave changes are new from prior Confirmed by Isla Pence [phone]) on 07/01/2021 3:59:09 PM  Radiology DG Chest Port 1 View  Result Date: 07/01/2021 CLINICAL DATA:  Questionable  sepsis. EXAM: PORTABLE CHEST 1 VIEW COMPARISON:  May 11, 2021 FINDINGS: Multiple sternal wires are seen. A dual lead AICD is noted. The nasogastric tube seen on the prior study has been removed. Mild atelectasis is seen within the bilateral lung bases. There is no evidence of a pleural effusion or pneumothorax. The heart size and mediastinal contours are within normal limits. There is marked severity calcification of the aortic arch. A radiopaque catheter/stent is seen overlying the left upper quadrant. The visualized skeletal structures are unremarkable. IMPRESSION: 1. Evidence of prior median sternotomy/CABG. 2. Mild bibasilar atelectasis. Electronically Signed   By: Virgina Norfolk M.D.   On: 07/01/2021 15:54    Procedures Procedures   Medications Ordered in ED Medications  lactated ringers infusion ( Intravenous New Bag/Given 07/01/21 1536)  metroNIDAZOLE (FLAGYL) IVPB 500 mg (500 mg Intravenous New Bag/Given 07/01/21 1539)  ceFEPIme (MAXIPIME) 2 g in sodium chloride 0.9 % 100 mL IVPB (has no administration in time range)  vancomycin (VANCOREADY) IVPB 1500 mg/300 mL (has no administration in time range)  ibuprofen (ADVIL) tablet 600 mg (has no administration in time range)  lactated ringers bolus 1,000 mL (1,000 mLs Intravenous New Bag/Given 07/01/21 1523)    And  lactated ringers bolus 1,000 mL (1,000 mLs Intravenous New Bag/Given 07/01/21 1524)    And  lactated ringers bolus 500 mL (500 mLs Intravenous New Bag/Given 07/01/21 1524)    ED Course  I have reviewed the triage vital signs and the nursing notes.  Pertinent labs & imaging results that were available during my care of the patient were reviewed  by me and considered in my medical decision making (see chart for details).    MDM Rules/Calculators/A&P                           Code sepsis called.  Sepsis is from urine or from his foot or both.  Sepsis fluids given.  Pt given IV flagyl, maxipime, and vancomycin.  Labs  pending at shift change.  Pt signed out to Dr. Roslynn Amble.  CRITICAL CARE Performed by: Isla Pence   Total critical care time: 30 minutes  Critical care time was exclusive of separately billable procedures and treating other patients.  Critical care was necessary to treat or prevent imminent or life-threatening deterioration.  Critical care was time spent personally by me on the following activities: development of treatment plan with patient and/or surrogate as well as nursing, discussions with consultants, evaluation of patient's response to treatment, examination of patient, obtaining history from patient or surrogate, ordering and performing treatments and interventions, ordering and review of laboratory studies, ordering and review of radiographic studies, pulse oximetry and re-evaluation of patient's condition.  Final Clinical Impression(s) / ED Diagnoses Final diagnoses:  Sepsis, due to unspecified organism, unspecified whether acute organ dysfunction present [facility])  Metabolic encephalopathy  Gangrene of left foot [facility])    Rx / DC Orders ED Discharge Orders     None        Isla Pence, MD 07/01/21 1619

## 2021-07-01 NOTE — Sepsis Progress Note (Signed)
eLink monitoring code sepsis.  

## 2021-07-01 NOTE — Assessment & Plan Note (Signed)
Admit to med/surg bed. Continue with gentle IVF. Pt received 30 ml/kg IVF due to code sepsis.

## 2021-07-01 NOTE — Sepsis Progress Note (Signed)
Notified provider of need to order repeat lactic acid (3rd). .  

## 2021-07-01 NOTE — Assessment & Plan Note (Signed)
Hold all nephrotoxic agents. Continue with gentle IVF. Repeat BMP. EDP ordering non-contrast CT to evaluate for hydronephrosis or dislodged nephrostomy tube.

## 2021-07-01 NOTE — ED Triage Notes (Signed)
Pt from [employer] called EMS for him being unresponsive. Pt alert on arrival to ED. Facility reports tmax of 104.9. Vicodin given by facility and 650 mg tylenol given by EMS. 500 cc bolus given by EMS. Pt has necrotic L leg. Nephrostomy with hematuria noted in bag.

## 2021-07-01 NOTE — Assessment & Plan Note (Addendum)
Was initially hypotensive. Received 30 ml/kg IVF. Continue midodrine. Hold lasix.

## 2021-07-01 NOTE — Progress Notes (Signed)
Pharmacy Antibiotic Note  Ryan [name] is a 75 y.o. male admitted on 07/01/2021 presenting unresponsive from SNF, febrile, chronic foot wound, concern for sepsis.  Pharmacy has been consulted for vancomycin and cefepime dosing.  Plan: Vancomycin 1500 mg IV x 1, then 1000 mg IV q 24h (eAUC 496, Goal AUC 400-550, SCr 1.79) Cefepime 2g IV q 12h Monitor renal function, Cx/clinical progression and potential surgery plans Vancomycin levels as needed     Temp (24hrs), Avg:102.3 F (39.1 C), Min:102.3 F (39.1 C), Max:102.3 F (39.1 C)  Recent Labs  Lab 07/01/21 1508 07/01/21 1625  WBC 19.7*  --   CREATININE  --  1.79*  LATICACIDVEN 1.7 2.0*    CrCl cannot be calculated (Unknown ideal weight.).    Allergies  Allergen Reactions   Geralyn [place] [Fish Allergy] Nausea And Vomiting    Bertis Ruddy, PharmD Clinical Pharmacist ED Pharmacist Phone # [phone] 07/01/2021 6:29 PM

## 2021-07-01 NOTE — Assessment & Plan Note (Signed)
According to telephone note dated 06-29-2021, family is now willing to proceed with left AKA. May need vascular surgery to see patient during hospitalization.

## 2021-07-01 NOTE — ED Provider Notes (Signed)
Sign out note  75 y/o male with fever, AMS. SVT, heart failure with ejection fraction 25 to 30%, paroxysmal atrial fibrillation, history of endocarditis, hypertension,and peripheral vascular disease. Febrile here. Concern for sepsis. Foot appears chronic and felt to be less likely source of fever today. Vascular had previously recommended L AKA but family not willing to proceed per note on 10/04. Pt given large fluid bolus and broad abx.   4:30 PM received sign out from [place]  7:52 PM CMP with AKI. UA resulted, consistent with infection, consulted hospitalist   Lucrezia Starch, MD 07/03/21 0000

## 2021-07-01 NOTE — Assessment & Plan Note (Signed)
Does not appear volume overloaded but pt did receive 30 ml/kg IVF due to code sepsis. Follow closely.

## 2021-07-01 NOTE — Subjective & Objective (Signed)
CC: altered mental status HPI:  75yo AAM with hx of chronic systolic CHF EF [id number], HTN, DM2, CKD stage 3, PVD s/p right AKA, ischemic left leg needing amputation but family refusing, hx of PE on Eliquis, hx of stroke, hx of retroperitoneal hematoma requiring left-sided nephrostomy tube due to urinary obstruction, who Presents to ER from SNF due to reported altered mental status.  Due to altered mental status, patient cannot give any history or review of systems.  Patient noted to be febrile to 102.3 on admission.  Code sepsis was started.  Patient received 30 cc/kg of IV fluids.  Lab evaluation showed a white count of 19.7 hemoglobin 10.6 platelets of 236.  COVID swab was negative.  Lactic acid was elevated 2.0.  Chemistry showed a sodium 140 BUN of 35 creatinine 1.79.  Baseline creatinine approximately 1.1-1.2.  Due to sepsis, aki, altered mental status, [facility] hospitalist contacted for admission.

## 2021-07-01 NOTE — Assessment & Plan Note (Signed)
Acute exacerbated. Baseline Scr 1.1. continue with gentle IVf. Hold nephrotoxic agents.

## 2021-07-01 NOTE — ED Notes (Signed)
Patient is resting comfortably. 

## 2021-07-02 ENCOUNTER — Inpatient Hospital Stay (HOSPITAL_COMMUNITY): Payer: Medicare HMO

## 2021-07-02 DIAGNOSIS — I5022 Chronic systolic (congestive) heart failure: Secondary | ICD-10-CM | POA: Diagnosis not present

## 2021-07-02 DIAGNOSIS — I739 Peripheral vascular disease, unspecified: Secondary | ICD-10-CM

## 2021-07-02 DIAGNOSIS — I2699 Other pulmonary embolism without acute cor pulmonale: Secondary | ICD-10-CM

## 2021-07-02 DIAGNOSIS — A419 Sepsis, unspecified organism: Secondary | ICD-10-CM | POA: Diagnosis not present

## 2021-07-02 DIAGNOSIS — G9341 Metabolic encephalopathy: Secondary | ICD-10-CM

## 2021-07-02 DIAGNOSIS — I10 Essential (primary) hypertension: Secondary | ICD-10-CM | POA: Diagnosis not present

## 2021-07-02 DIAGNOSIS — N179 Acute kidney failure, unspecified: Secondary | ICD-10-CM | POA: Diagnosis not present

## 2021-07-02 LAB — BLOOD CULTURE ID PANEL (REFLEXED) - BCID2

## 2021-07-02 LAB — GLUCOSE, CAPILLARY
Glucose-Capillary: 68 mg/dL — ABNORMAL LOW (ref 70–99)
Glucose-Capillary: 76 mg/dL (ref 70–99)
Glucose-Capillary: 80 mg/dL (ref 70–99)
Glucose-Capillary: 80 mg/dL (ref 70–99)
Glucose-Capillary: 83 mg/dL (ref 70–99)
Glucose-Capillary: 93 mg/dL (ref 70–99)
Glucose-Capillary: 99 mg/dL (ref 70–99)

## 2021-07-02 LAB — COMPREHENSIVE METABOLIC PANEL
ALT: 15 U/L (ref 0–44)
AST: 40 U/L (ref 15–41)
Albumin: 1.9 g/dL — ABNORMAL LOW (ref 3.5–5.0)
Alkaline Phosphatase: 84 U/L (ref 38–126)
Anion gap: 7 (ref 5–15)
BUN: 34 mg/dL — ABNORMAL HIGH (ref 8–23)
CO2: 23 mmol/L (ref 22–32)
Calcium: 8.5 mg/dL — ABNORMAL LOW (ref 8.9–10.3)
Chloride: 109 mmol/L (ref 98–111)
Creatinine, Ser: 1.68 mg/dL — ABNORMAL HIGH (ref 0.61–1.24)
GFR, Estimated: 42 mL/min — ABNORMAL LOW (ref >60)
Glucose, Bld: 98 mg/dL (ref 70–99)
Potassium: 3.8 mmol/L (ref 3.5–5.1)
Sodium: 139 mmol/L (ref 135–145)
Total Bilirubin: 1.5 mg/dL — ABNORMAL HIGH (ref 0.3–1.2)
Total Protein: 5.8 g/dL — ABNORMAL LOW (ref 6.5–8.1)

## 2021-07-02 LAB — MAGNESIUM: Magnesium: 1.8 mg/dL (ref 1.7–2.4)

## 2021-07-02 LAB — CBC WITH DIFFERENTIAL/PLATELET
Abs Immature Granulocytes: 0 10*3/uL (ref 0.00–0.07)
Basophils Absolute: 0 10*3/uL (ref 0.0–0.1)
Basophils Relative: 0 %
Eosinophils Absolute: 0 10*3/uL (ref 0.0–0.5)
Eosinophils Relative: 0 %
HCT: 27.1 % — ABNORMAL LOW (ref 39.0–52.0)
Hemoglobin: 8.3 g/dL — ABNORMAL LOW (ref 13.0–17.0)
Lymphocytes Relative: 5 %
Lymphs Abs: 0.9 10*3/uL (ref 0.7–4.0)
MCH: 22 pg — ABNORMAL LOW (ref 26.0–34.0)
MCHC: 30.6 g/dL (ref 30.0–36.0)
MCV: 71.7 fL — ABNORMAL LOW (ref 80.0–100.0)
Monocytes Absolute: 0.2 10*3/uL (ref 0.1–1.0)
Monocytes Relative: 1 %
Neutro Abs: 16.4 10*3/uL — ABNORMAL HIGH (ref 1.7–7.7)
Neutrophils Relative %: 94 %
Platelets: 176 10*3/uL (ref 150–400)
RBC: 3.78 MIL/uL — ABNORMAL LOW (ref 4.22–5.81)
RDW: 22.5 % — ABNORMAL HIGH (ref 11.5–15.5)
WBC: 17.4 10*3/uL — ABNORMAL HIGH (ref 4.0–10.5)
nRBC: 0 % (ref 0.0–0.2)
nRBC: 0 /100 WBC

## 2021-07-02 LAB — HEMOGLOBIN A1C
Hgb A1c MFr Bld: 5.5 % (ref 4.8–5.6)
Mean Plasma Glucose: 111.15 mg/dL

## 2021-07-02 LAB — LACTIC ACID, PLASMA: Lactic Acid, Venous: 1 mmol/L (ref 0.5–1.9)

## 2021-07-02 LAB — PROCALCITONIN: Procalcitonin: 5.81 ng/mL

## 2021-07-02 MED ORDER — SODIUM CHLORIDE 0.9 % IV SOLN: INTRAVENOUS | Status: DC

## 2021-07-02 MED ORDER — SODIUM CHLORIDE 0.9 % IV SOLN
2.0000 g | INTRAVENOUS | Status: AC
  Administered 2021-07-02 – 2021-07-08 (×7): 2 g via INTRAVENOUS
  Filled 2021-07-02 (×7): qty 20

## 2021-07-02 NOTE — Progress Notes (Signed)
PHARMACY - PHYSICIAN COMMUNICATION CRITICAL VALUE ALERT - BLOOD CULTURE IDENTIFICATION (BCID)  [name] is an 75 y.o. male who presented to [facility] on 07/01/2021 with a chief complaint of sepsis  Assessment:  Pt with 4/4 blood culture bottles growing strep species (source: urine vs ischemic L foot)  Name of physician (or Provider) Contacted: Dr. Bridgett Larsson  Current antibiotics: Cefepime, Flagyl, and Vancomcyin  Changes to prescribed antibiotics recommended:  D/c Vanc and Cefepime. Start ceftriaxone 2gm IV q24h  Results for orders placed or performed during the hospital encounter of 07/01/21  Blood Culture ID Panel (Reflexed) (Collected: 07/01/2021  3:08 PM)  Result Value Ref Range   Enterococcus faecalis NOT DETECTED NOT DETECTED   Enterococcus Faecium NOT DETECTED NOT DETECTED   Listeria monocytogenes NOT DETECTED NOT DETECTED   Staphylococcus species NOT DETECTED NOT DETECTED   Staphylococcus aureus (BCID) NOT DETECTED NOT DETECTED   Staphylococcus epidermidis NOT DETECTED NOT DETECTED   Staphylococcus lugdunensis NOT DETECTED NOT DETECTED   Streptococcus species DETECTED (A) NOT DETECTED   Streptococcus agalactiae NOT DETECTED NOT DETECTED   Streptococcus pneumoniae NOT DETECTED NOT DETECTED   Streptococcus pyogenes NOT DETECTED NOT DETECTED   A.calcoaceticus-baumannii NOT DETECTED NOT DETECTED   Bacteroides fragilis NOT DETECTED NOT DETECTED   Enterobacterales NOT DETECTED NOT DETECTED   Enterobacter cloacae complex NOT DETECTED NOT DETECTED   Escherichia coli NOT DETECTED NOT DETECTED   Klebsiella aerogenes NOT DETECTED NOT DETECTED   Klebsiella oxytoca NOT DETECTED NOT DETECTED   Klebsiella pneumoniae NOT DETECTED NOT DETECTED   Proteus species NOT DETECTED NOT DETECTED   Salmonella species NOT DETECTED NOT DETECTED   Serratia marcescens NOT DETECTED NOT DETECTED   Haemophilus influenzae NOT DETECTED NOT DETECTED   Neisseria meningitidis NOT DETECTED NOT DETECTED    Pseudomonas aeruginosa NOT DETECTED NOT DETECTED   Stenotrophomonas maltophilia NOT DETECTED NOT DETECTED   Candida albicans NOT DETECTED NOT DETECTED   Candida auris NOT DETECTED NOT DETECTED   Candida glabrata NOT DETECTED NOT DETECTED   Candida krusei NOT DETECTED NOT DETECTED   Candida parapsilosis NOT DETECTED NOT DETECTED   Candida tropicalis NOT DETECTED NOT DETECTED   Cryptococcus neoformans/gattii NOT DETECTED NOT DETECTED    Sherlon Handing, PharmD, BCPS Please see amion for complete clinical pharmacist phone list 07/02/2021  6:56 AM

## 2021-07-02 NOTE — Plan of Care (Signed)
  Problem: Education: Goal: Knowledge of General Education information will improve Description: Including pain rating scale, medication(s)/side effects and non-pharmacologic comfort measures Outcome: Not Progressing   Problem: Health Behavior/Discharge Planning: Goal: Ability to manage health-related needs will improve Outcome: Not Progressing   Problem: Clinical Measurements: Goal: Ability to maintain clinical measurements within normal limits will improve Outcome: Not Progressing Goal: Will remain free from infection Outcome: Not Progressing Goal: Diagnostic test results will improve Outcome: Not Progressing Goal: Respiratory complications will improve Outcome: Not Progressing Goal: Cardiovascular complication will be avoided Outcome: Not Progressing   Problem: Activity: Goal: Risk for activity intolerance will decrease Outcome: Not Progressing   Problem: Nutrition: Goal: Adequate nutrition will be maintained Outcome: Not Progressing   Problem: Coping: Goal: Level of anxiety will decrease Outcome: Not Progressing   Problem: Elimination: Goal: Will not experience complications related to bowel motility Outcome: Not Progressing Goal: Will not experience complications related to urinary retention Outcome: Not Progressing   Problem: Pain Managment: Goal: General experience of comfort will improve Outcome: Not Progressing   Problem: Safety: Goal: Ability to remain free from injury will improve Outcome: Not Progressing   Problem: Fluid Volume: Goal: Hemodynamic stability will improve Outcome: Not Progressing   Problem: Clinical Measurements: Goal: Diagnostic test results will improve Outcome: Not Progressing Goal: Signs and symptoms of infection will decrease Outcome: Not Progressing   Problem: Respiratory: Goal: Ability to maintain adequate ventilation will improve Outcome: Not Progressing

## 2021-07-02 NOTE — Discharge Instructions (Signed)
Information on my medicine - ELIQUIS (apixaban)  This medication education was reviewed with me or my healthcare representative as part of my discharge preparation.  The pharmacist that spoke with me during my hospital stay was:    Why was Eliquis prescribed for you? Eliquis was prescribed to treat blood clots that may have been found in the veins of your legs (deep vein thrombosis) or in your lungs (pulmonary embolism) and to reduce the risk of them occurring again.  What do You need to know about Eliquis ? Continue Eliquist 5 mg tablet taken TWICE daily.  Eliquis may be taken with or without food.   Try to take the dose about the same time in the morning and in the evening. If you have difficulty swallowing the tablet whole please discuss with your pharmacist how to take the medication safely.  Take Eliquis exactly as prescribed and DO NOT stop taking Eliquis without talking to the doctor who prescribed the medication.  Stopping may increase your risk of developing a new blood clot.  Refill your prescription before you run out.  After discharge, you should have regular check-up appointments with your healthcare provider that is prescribing your Eliquis.    What do you do if you miss a dose? If a dose of ELIQUIS is not taken at the scheduled time, take it as soon as possible on the same day and twice-daily administration should be resumed. The dose should not be doubled to make up for a missed dose.  Important Safety Information A possible side effect of Eliquis is bleeding. You should call your healthcare provider right away if you experience any of the following: Bleeding from an injury or your nose that does not stop. Unusual colored urine (red or dark brown) or unusual colored stools (red or black). Unusual bruising for unknown reasons. A serious fall or if you hit your head (even if there is no bleeding).  Some medicines may interact with Eliquis and might increase your  risk of bleeding or clotting while on Eliquis. To help avoid this, consult your healthcare provider or pharmacist prior to using any new prescription or non-prescription medications, including herbals, vitamins, non-steroidal anti-inflammatory drugs (NSAIDs) and supplements.  This website has more information on Eliquis (apixaban): http://www.eliquis.com/eliquis/home

## 2021-07-02 NOTE — Consult Note (Signed)
Hospital Consult    Reason for Consult: Left lower extremity Rutherford 6 critical limb ischemia Requesting Physician: Dr. Darrick Meigs MRN #:  [id number]  History of Present Illness: This is a 75 y.o. 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  He has been followed in the outpatient setting with tissue loss on his left foot.  At his last visit, he was offered above-knee amputation, and declined.  Over the last several days, Ryan [name] has appreciated increased pain in the left leg.  He has also noted more necrosis of the foot.  He asked Dr. Darrick Meigs for vascular surgery consultation this morning to discuss surgical options.   On exam, Ryan [name] was resting comfortably, conversational.  He denied fevers, chills described left foot pain beginning at the calf.  This has been well controlled with pain medication.  Denied significant erythema/induration, but noted new black eschars over the last several days.   Past Medical History:  Diagnosis Date   Acute pericarditis 09/16/2012   Possible purulent pericarditis in setting of staph bacteremia   AICD (automatic cardioverter/defibrillator) present    Arthritis    Atrial fibrillation (HCC)    Cardiomyopathy, dilated (HCC)    CHF (congestive heart failure) (HCC)    Diabetes mellitus without complication ([place])    DVT of left external iliac vein 04/2021 06/06/2021   Endocarditis    Hypertension    Peripheral vascular disease ([place])    Pulmonary emboli 04/2021 06/06/2021   PVC's (premature ventricular contractions)    SVT (supraventricular tachycardia)  long RP     Past Surgical History:  Procedure Laterality Date   ABDOMINAL AORTAGRAM N/A 10/03/2012   Procedure: ABDOMINAL Maxcine Ham;  Surgeon: [name], MD;  Location: [facility] CATH LAB;   Service: Cardiovascular;  Laterality: N/A;   ABDOMINAL AORTOGRAM W/LOWER EXTREMITY N/A 02/25/2021   Procedure: ABDOMINAL AORTOGRAM W/LOWER EXTREMITY;  Surgeon: Cherre , MD;  Location: [place] CV LAB;  Service: Cardiovascular;  Laterality: N/A;   ABDOMINAL AORTOGRAM W/LOWER EXTREMITY N/A 04/13/2021   Procedure: ABDOMINAL AORTOGRAM W/LOWER EXTREMITY;  Surgeon: Waynetta Sandy, MD;  Location: [place] CV LAB;  Service: Cardiovascular;  Laterality: N/A;   AMPUTATION Right 05/12/2021   Procedure: AMPUTATION ABOVE KNEE RIGHT;  Surgeon: Angelia Mould, MD;  Location: [facility] OR;  Service: Vascular;  Laterality: Right;   APPLICATION OF WOUND VAC Right 04/09/2021   Procedure: APPLICATION OF WOUND VAC;  Surgeon: Cherre , MD;  Location: [place];  Service: Vascular;  Laterality: Right;   BYPASS GRAFT FEMORAL-PERONEAL Right 03/06/2021   Procedure: RIGHT ABOVE KNEE POPLITEAL ARTERY-PERONEAL BYPASS;  Surgeon: Cherre , MD;  Location: [place];  Service: Vascular;  Laterality: Right;   CORONARY ANGIOGRAM  09/21/2012   Procedure: CORONARY ANGIOGRAM;  Surgeon: [name], MD;  Location: [facility] CATH LAB;  Service: Cardiovascular;;   EP IMPLANTABLE DEVICE N/A 06/11/2016   Procedure: ICD Implant;  Surgeon: Will Meredith Leeds, MD;  Location: [place] CV LAB;  Service: Cardiovascular;  Laterality: N/A;   EXTREMITY WIRE/PIN REMOVAL  09/14/2012   Procedure: REMOVAL K-WIRE/PIN EXTREMITY;  Surgeon: [name], MD;  Location: [place];  Service: Orthopedics;  Laterality: Right;  Right Foot   I & D EXTREMITY  09/14/2012   Procedure: IRRIGATION AND DEBRIDEMENT EXTREMITY;  Surgeon: Tennis Must, MD;  Location: [facility]  OR;  Service: Orthopedics;  Laterality: Right;   INTRAOPERATIVE TRANSESOPHAGEAL ECHOCARDIOGRAM  09/26/2012   Procedure: INTRAOPERATIVE TRANSESOPHAGEAL ECHOCARDIOGRAM;  Surgeon: [name], MD;  Location: [facility] OR;  Service: Open Heart Surgery;  Laterality: N/A;   IR FLUORO GUIDE  CV LINE RIGHT  04/20/2021   IR NEPHROSTOMY EXCHANGE LEFT  06/07/2021   IR NEPHROSTOMY PLACEMENT LEFT  04/20/2021   IR US GUIDE VASC ACCESS RIGHT  04/20/2021   MITRAL VALVE REPLACEMENT  09/26/2012   Procedure: MITRAL VALVE (MV) REPLACEMENT;  Surgeon: [name], MD;  Location: [place];  Service: Open Heart Surgery;  Laterality: N/A;   RIGHT HEART CATH N/A 08/29/2020   Procedure: RIGHT HEART CATH;  Surgeon: Larey Dresser, MD;  Location: [place] CV LAB;  Service: Cardiovascular;  Laterality: N/A;   RIGHT HEART CATHETERIZATION  09/21/2012   Procedure: RIGHT HEART CATH;  Surgeon: [name], MD;  Location: [facility] CATH LAB;  Service: Cardiovascular;;   RIGHT/LEFT HEART CATH AND CORONARY ANGIOGRAPHY N/A 01/30/2021   Procedure: RIGHT/LEFT HEART CATH AND CORONARY ANGIOGRAPHY;  Surgeon: Larey Dresser, MD;  Location: [place] CV LAB;  Service: Cardiovascular;  Laterality: N/A;   SVT ABLATION N/A 09/03/2020   Procedure: SVT ABLATION;  Surgeon: Constance Haw, MD;  Location: [place] CV LAB;  Service: Cardiovascular;  Laterality: N/A;   TEE WITHOUT CARDIOVERSION  09/18/2012   Procedure: TRANSESOPHAGEAL ECHOCARDIOGRAM (TEE);  Surgeon: [name], MD;  Location: [facility] ENDOSCOPY;  Service: Cardiovascular;  Laterality: N/A;   WOUND DEBRIDEMENT Right 04/09/2021   Procedure: DEBRIDEMENT RIGHT HEEL WOUND AND PARTIAL FIRST TOE AMPUTATION AND SECOND TOE AMPUTATION. Application of Myriad skin substitute;  Surgeon: Cherre , MD;  Location: [place];  Service: Vascular;  Laterality: Right;    Allergies  Allergen Reactions   Geralyn Flash [Fish Allergy] Nausea And Vomiting    Prior to Admission medications   Medication Sig Start Date End Date Taking? Authorizing Provider  acetaminophen (TYLENOL) 325 MG tablet Take 650 mg by mouth every 6 (six) hours as needed for mild pain or headache.   Yes [provider]  Amino Acids-Protein Hydrolys (FEEDING SUPPLEMENT, PRO-STAT SUGAR FREE 64,) LIQD Take 30  mLs by mouth 3 (three) times daily. Take with 4 oz juice   Yes [provider]  amiodarone (PACERONE) 200 MG tablet TAKE 1 TABLET BY MOUTH EVERY DAY Patient taking differently: Take 200 mg by mouth daily. 03/03/21  Yes Bhagat, Bhavinkumar, PA  apixaban (ELIQUIS) 5 MG TABS tablet Take 1 tablet (5 mg total) by mouth every 12 (twelve) hours. 05/22/21  Yes [place] Eric J, DO  atorvastatin (LIPITOR) 40 MG tablet Take 1 tablet (40 mg total) by mouth daily at 6 PM. Patient taking differently: Take 40 mg by mouth at bedtime. 10/06/12  Yes Timmothy Euler, MD  bisacodyl (DULCOLAX) 5 MG EC tablet Take 5 mg by mouth daily as needed (constipation).   Yes [provider]  docusate sodium (COLACE) 100 MG capsule Take 100 mg by mouth daily.   Yes [provider]  empagliflozin (JARDIANCE) 10 MG TABS tablet Take 1 tablet (10 mg total) by mouth daily. 11/11/20  Yes Larey Dresser, MD  Ensure (ENSURE) Take 237 mLs by mouth in the morning, at noon, and at bedtime.   Yes [provider]  furosemide (LASIX) 40 MG tablet Take 40 mg by mouth daily.   Yes [provider]  gabapentin (NEURONTIN) 300 MG capsule Take 300 mg by mouth  at bedtime.   Yes [provider]  guaiFENesin (ROBITUSSIN) 100 MG/5ML SOLN Take 100 mg by mouth every 4 (four) hours as needed for cough.   Yes [provider]  HYDROcodone-acetaminophen (NORCO) 7.5-325 MG tablet Take 1 tablet by mouth every 6 (six) hours as needed for severe pain (in left foot).   Yes [provider]  midodrine (PROAMATINE) 10 MG tablet Take 1 tablet (10 mg total) by mouth 3 (three) times daily with meals. 05/22/21  Yes [place] Eric J, DO  pantoprazole (PROTONIX) 40 MG tablet Take 40 mg by mouth daily. 11/12/14  Yes [provider]  triamcinolone cream (KENALOG) 0.5 % Apply 1 application topically daily as needed (skin rash).  02/15/14  Yes [provider]    Social History    Socioeconomic History   Marital status: Divorced    Spouse name: Not on file   Number of children: 3   Years of education: Not on file   Highest education level: Not on file  Occupational History   Not on file  Tobacco Use   Smoking status: Never   Smokeless tobacco: Never  Vaping Use   Vaping Use: Never used  Substance and Sexual Activity   Alcohol use: No    Alcohol/week: 0.0 standard drinks   Drug use: No   Sexual activity: Not on file  Other Topics Concern   Not on file  Social History Narrative   Daughter lives with him usually but is living in [facility]  Retired [place] Strain: Not on file  Food Insecurity: No [place] in the Last Year: Never true   Arboriculturist in the Last Year: Never true  Transportation Needs: No Transportation Needs   Lack of Transportation (Medical): No   Lack of Transportation (Non-Medical): No  Physical Activity: Not on file  Stress: Not on file  Social Connections: Not on file  Intimate Partner Violence: Not on file    Family History  Problem Relation Age of Onset   Hypertension Mother    Hypertension Father     ROS: Otherwise negative unless mentioned in HPI  Physical Examination  Vitals:   07/01/21 2343 07/02/21 0832  BP: 114/62 (!) 116/54  Pulse: 83 72  Resp: 18 18  Temp: 98.3 F (36.8 C) 98.6 F (37 C)  SpO2: 98% 98%   There is no height or weight on file to calculate BMI.  General:  WDWN in NAD Gait: Not observed HENT: WNL, normocephalic Pulmonary: normal non-labored breathing, without Rales, rhonchi,  wheezing Cardiac: regular, Abdomen:  soft, NT/ND, no masses, RP drain present Skin: without rashes Vascular Exam/Pulses: 2 femoral pulses bilaterally, nonpalpable pulse in the left foot Right AKA healed  Extremities: with ischemic changes, with dry Gangrene , with mild cellulitis; with  open wounds;  Musculoskeletal: no muscle wasting or atrophy  Neurologic: A&O X 3;  No focal weakness or paresthesias are detected; speech is fluent/normal Psychiatric:  The pt has Normal affect. Lymph:  Unremarkable  CBC    Component Value Date/Time   WBC 17.4 (H) 07/02/2021 0219   RBC 3.78 (L) 07/02/2021 0219   HGB 8.3 (L) 07/02/2021 0219   HGB 9.6 (L) 03/27/2021 1123   HCT 27.1 (L) 07/02/2021 0219   HCT 30.4 (L) 03/27/2021 1123   PLT 176 07/02/2021 0219   PLT 362 03/27/2021 1123  MCV 71.7 (L) 07/02/2021 0219   MCV 77 (L) 03/27/2021 1123   MCH 22.0 (L) 07/02/2021 0219   MCHC 30.6 07/02/2021 0219   RDW 22.5 (H) 07/02/2021 0219   RDW 17.2 (H) 03/27/2021 1123   LYMPHSABS 0.9 07/02/2021 0219   MONOABS 0.2 07/02/2021 0219   EOSABS 0.0 07/02/2021 0219   BASOSABS 0.0 07/02/2021 0219    BMET    Component Value Date/Time   NA 139 07/02/2021 0219   NA 142 03/27/2021 1123   K 3.8 07/02/2021 0219   CL 109 07/02/2021 0219   CO2 23 07/02/2021 0219   GLUCOSE 98 07/02/2021 0219   BUN 34 (H) 07/02/2021 0219   BUN 17 03/27/2021 1123   CREATININE 1.68 (H) 07/02/2021 0219   CREATININE 1.17 06/01/2016 1414   CALCIUM 8.5 (L) 07/02/2021 0219   GFRNONAA 42 (L) 07/02/2021 0219   GFRAA 39 (L) 10/01/2020 1157    COAGS: Lab Results  Component Value Date   INR 2.5 (H) 07/01/2021   INR 1.8 (H) 06/06/2021   INR 1.4 (H) 05/01/2021     Non-Invasive Vascular Imaging:   -  Statin:  No. Beta Blocker:  No. Aspirin:  No. ACEI:  No. ARB:  No. CCB use:  No Other antiplatelets/anticoagulants:  Yes.   Eliquis   ASSESSMENT/PLAN: This is a 75 y.o. male with previous right above-knee amputation, now presenting with Rutherford 6 critical limb ischemia in the left leg.  No signs of a sending infection, no need for emergent amputation.  Unfortunately, with the patient's severe vascular disease, as well as significant tissue loss, the only intervention I can offer is above-knee amputation.   After discussing the risks and benefits of above-knee amputation, Ryan [name] elected to proceed.  I called both his sister as well as his daughter-in-law to discuss our visit.  Both of them were pleased to hear that he had excepted amputation.  The patient is scheduled for Tuesday, October 18 for left above-knee amputation.  Please call if there is significant progression, or new onset erythema and induration in the left leg.  Please continue broad-spectrum antibiotics.  Please hold Eliquis beginning Sunday.    Cassandria Santee MD MS [facility] [phone] 07/02/2021  2:18 PM

## 2021-07-02 NOTE — Progress Notes (Addendum)
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 In the ED COVID-19 was negative, WBC 19.7, he was febrile with temperature 102.3.  Code sepsis was initiated.  Patient received 30 cc/kg IV fluids per sepsis protocol.  Lactic acid was 2.0.  Blood cultures x2 obtained.    Subjective   Patient seen and examined, denies any complaints.  He has gangrene involving left second toe and left foot.  Was recommended amputation of left lower extremity however he had refused earlier.  Now he is agreeable for amputation.   Assessment/Plan:    Sepsis -Patient presented with sepsis with acute kidney injury -Likely source of infection is left foot cellulitis, infected wounds -Blood culture growing Streptococcus, started on ceftriaxone -We will also continue with Flagyl as patient has foul-smelling discharge from left foot   Left foot gangrene/peripheral vascular disease -Patient has severe peripheral vascular disease -He was recommended to have left BKA by vascular surgery in the past but he was refusing -Now he is agreeable for amputation -Vascular surgery has been consulted, plan for above-knee amputation on 07/07/2021 -We will hold Eliquis starting Sunday, 07/05/2021  Altered mental status -Resolved; likely from above -At baseline  Acute kidney injury -Patient baseline creatinine 1.16, presented with creatinine of 1.79 -Creatinine improved today to 1.68 -He has nephrostomy tube in  place; CT scan shows left nephrostomy tube with no complicating features, resolved hydronephrosis -Avoid nephrotoxic agents -Cannot give IV fluids due to history of underlying CHF -Follow BMP in am  Chronic systolic heart failure -Patient's EF is 25 to 30% -We will avoid giving IV fluids  Diabetes mellitus type 2 -CBG well controlled -Continue sliding scale insulin with NovoLog  Pulmonary embolism 04/2021 -Continue Eliquis  Nephrostomy status -Patient has nephrostomy tube replaced on 06/07/2021 after he pulled it out -CT renal stone study shows nephrostomy tube without complications  Hypotension -Patient is on midodrine 10 mg p.o. 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  -Amiodarone 200 mg p.o. daily and Eliquis 5 mg p.o. twice daily  Scheduled medications:    amiodarone  200 mg Oral Daily   apixaban  5 mg Oral Q12H   insulin aspart  0-9 Units Subcutaneous Q6H   midodrine  10 mg Oral TID WC     Data Reviewed:   CBG:  Recent Labs  Lab 07/02/21 0059 07/02/21 0644 07/02/21 0818 07/02/21 1229 07/02/21 1639  GLUCAP 99 80 83 80 76    SpO2: 98 %    Vitals:   07/01/21 2215 07/01/21 2217 07/01/21 2343 07/02/21 0832  BP: (!) 107/52  114/62 (!) 116/54  Pulse: 70  83 72  Resp: '12  18 18  '$ Temp:  99.8 F (37.7 C) 98.3 F (36.8 C) 98.6 F (37 C)  TempSrc:  Axillary Oral Oral  SpO2: 93%  98% 98%     Intake/Output Summary (Last 24 hours) at 07/02/2021 1805 Last data filed at 07/02/2021 1736 Gross per 24 hour  Intake 1304.14 ml  Output 500 ml  Net 804.14  ml    10/11 1901 - 10/13 0700 In: 4501.6 [I.V.:1499] Out: -   There were no vitals filed for this visit.  Data Reviewed: Basic Metabolic Panel: Recent Labs  Lab 07/01/21 1625 07/01/21 2034 07/02/21 0219  NA 140 140 139  K 4.2 4.4 3.8  CL 107 108 109  CO2 '23 24 23  '$ GLUCOSE 114* 113* 98  BUN 35* 33* 34*  CREATININE 1.79* 1.60* 1.68*  CALCIUM 8.6*  8.5* 8.5*  MG  --   --  1.8   Liver Function Tests: Recent Labs  Lab 07/01/21 1625 07/01/21 2034 07/02/21 0219  AST 26 31 40  ALT '11 13 15  '$ ALKPHOS 96 91 84  BILITOT 0.9 1.1 1.5*  PROT 5.9* 6.3* 5.8*  ALBUMIN 2.0* 2.1* 1.9*   No results for input(s): LIPASE, AMYLASE in the last 168 hours. No results for input(s): AMMONIA in the last 168 hours. CBC: Recent Labs  Lab 07/01/21 1508 07/02/21 0219  WBC 19.7* 17.4*  NEUTROABS 18.3* 16.4*  HGB 10.6* 8.3*  HCT 35.5* 27.1*  MCV 73.2* 71.7*  PLT 236 176   Cardiac Enzymes: No results for input(s): CKTOTAL, CKMB, CKMBINDEX, TROPONINI in the last 168 hours. BNP (last 3 results) Recent Labs    04/28/21 0116 04/29/21 0826 05/02/21 2338  BNP 1,706.1* 2,208.9* 1,209.2*    ProBNP (last 3 results) Recent Labs    08/25/20 1121 03/27/21 1123  PROBNP 21,395* 3,146*    CBG: Recent Labs  Lab 07/02/21 0059 07/02/21 0644 07/02/21 0818 07/02/21 1229 07/02/21 1639  GLUCAP 99 80 83 80 76       Radiology Reports  DG Chest Port 1 View  Result Date: 07/01/2021 CLINICAL DATA:  Questionable sepsis. EXAM: PORTABLE CHEST 1 VIEW COMPARISON:  May 11, 2021 FINDINGS: Multiple sternal wires are seen. A dual lead AICD is noted. The nasogastric tube seen on the prior study has been removed. Mild atelectasis is seen within the bilateral lung bases. There is no evidence of a pleural effusion or pneumothorax. The heart size and mediastinal contours are within normal limits. There is marked severity calcification of the aortic arch. A radiopaque catheter/stent is seen overlying the left upper quadrant. The visualized skeletal structures are unremarkable. IMPRESSION: 1. Evidence of prior median sternotomy/CABG. 2. Mild bibasilar atelectasis. Electronically Signed   By: Virgina Norfolk M.D.   On: 07/01/2021 15:54   CT Renal Stone Study  Result Date: 07/02/2021 CLINICAL DATA:  Left flank pain EXAM: CT ABDOMEN AND PELVIS WITHOUT CONTRAST  TECHNIQUE: Multidetector CT imaging of the abdomen and pelvis was performed following the standard protocol without IV contrast. COMPARISON:  06/06/2021 FINDINGS: Lower chest: Scarring and cylindrical bronchiectasis at the bases. Cardiomegaly and minimally covered pacer lead. Hepatobiliary: No focal liver abnormality.Cholelithiasis. Full gallbladder without visible inflammation. Pancreas: Unremarkable. Spleen: Unremarkable. Adrenals/Urinary Tract: Negative adrenals. Left percutaneous nephrostomy tube in unremarkable position. No hydronephrosis, perinephric stranding, or post procedural hemorrhage. Pre-existing organized hematoma in the left retroperitoneum has clearly decreased in size. Pneumaturia with chronic bladder wall indistinctness and mild thickening. Stomach/Bowel: Desiccated high-density stool in the distal colon with rectal over distension measuring 8.5 cm. No appendicitis. Vascular/Lymphatic: No acute vascular abnormality. Atheromatous plaque which is multifocal on the aorta. No mass or adenopathy. Reproductive:No pathologic findings. Other: No ascites or pneumoperitoneum. Musculoskeletal: No acute abnormalities. Generalized lumbar spine degeneration with remote endplate fractures at L2, L3, and L5. IMPRESSION: 1. Interval left percutaneous nephrostomy tube with no complicating feature. Resolved hydronephrosis. 2. Pneumaturia which can be  correlated with urinalysis. 3. Decreasing left retroperitoneal hematoma. 4. Stool distended rectum measuring up to 8.5 cm diameter. 5. Cholelithiasis without cholecystitis. Electronically Signed   By: Jorje Guild M.D.   On: 07/02/2021 04:44       Antibiotics: Anti-infectives (From admission, onward)    Start     Dose/Rate Route Frequency Ordered Stop   07/02/21 1900  vancomycin (VANCOCIN) IVPB 1000 mg/200 mL premix  Status:  Discontinued        1,000 mg 200 mL/hr over 60 Minutes Intravenous Every 24 hours 07/01/21 1834 07/02/21 0656   07/02/21 1000   ceFEPIme (MAXIPIME) 2 g in sodium chloride 0.9 % 100 mL IVPB  Status:  Discontinued        2 g 200 mL/hr over 30 Minutes Intravenous Every 12 hours 07/01/21 1834 07/02/21 0656   07/02/21 0800  metroNIDAZOLE (FLAGYL) IVPB 500 mg        500 mg 100 mL/hr over 60 Minutes Intravenous Every 12 hours 07/01/21 2342     07/02/21 0800  cefTRIAXone (ROCEPHIN) 2 g in sodium chloride 0.9 % 100 mL IVPB        2 g 200 mL/hr over 30 Minutes Intravenous Every 24 hours 07/02/21 0656     07/01/21 1545  ceFEPIme (MAXIPIME) 2 g in sodium chloride 0.9 % 100 mL IVPB        2 g 200 mL/hr over 30 Minutes Intravenous  Once 07/01/21 1537 07/01/21 1756   07/01/21 1545  vancomycin (VANCOREADY) IVPB 1500 mg/300 mL        1,500 mg 150 mL/hr over 120 Minutes Intravenous  Once 07/01/21 1538 07/01/21 2000   07/01/21 1515  metroNIDAZOLE (FLAGYL) IVPB 500 mg        500 mg 100 mL/hr over 60 Minutes Intravenous  Once 07/01/21 1505 07/01/21 1641         DVT prophylaxis: Apixaban  Code Status: Full code  Family Communication: No family at bedside   Consultants:   Procedures:     Objective    Physical Examination:  General-appears in no acute distress Heart-S1-S2, regular, no murmur auscultated Lungs-clear to auscultation bilaterally, no wheezing or crackles auscultated Abdomen-soft, nontender, no organomegaly Extremities-s/p right AKA, left lower extremity noted to have foul-smelling discharge from the left second toe, gangrene noted in left second toe Neuro-alert, oriented x3, no focal deficit noted   Status is: Inpatient  Dispo: The patient is from: Home              Anticipated d/c is to: Skilled nursing facility              Anticipated d/c date is: 07/10/2021              Patient currently not stable for discharge  Barrier to discharge-ongoing valuation for sepsis, awaiting amputation of left lower extremity  COVID-19 Labs  No results for input(s): DDIMER, FERRITIN, LDH, CRP in the last  72 hours.  Lab Results  Component Value Date   SARSCOV2NAA NEGATIVE 07/01/2021   SARSCOV2NAA NEGATIVE 06/06/2021   SARSCOV2NAA POSITIVE (A) 05/21/2021   SARSCOV2NAA POSITIVE (A) 04/28/2021     Pressure Injury 04/21/21 Coccyx Medial Stage 2 -  Partial thickness loss of dermis presenting as a shallow open injury with a red, pink wound bed without slough. (Active)  04/21/21 1255  Location: Coccyx  Location Orientation: Medial  Staging: Stage 2 -  Partial thickness loss of dermis presenting as a shallow open injury with a red, pink wound  bed without slough.  Wound Description (Comments):   Present on Admission: No        Recent Results (from the past 240 hour(s))  Blood Culture (routine x 2)     Status: None (Preliminary result)   Collection Time: 07/01/21  3:08 PM   Specimen: BLOOD  Result Value Ref Range Status   Specimen Description BLOOD SITE NOT SPECIFIED  Final   Special Requests   Final    BOTTLES DRAWN AEROBIC AND ANAEROBIC Blood Culture adequate volume   Culture  Setup Time   Final    GRAM POSITIVE COCCI IN CHAINS IN BOTH AEROBIC AND ANAEROBIC BOTTLES CRITICAL RESULT CALLED TO, READ BACK BY AND VERIFIED WITH: PHARMD C.AMEND AT [place] 07/02/2021 BY [place]. Performed at [facility], [place] [address] [place], [place] [address]    Culture GRAM POSITIVE COCCI  Final   Report Status PENDING  Incomplete  Blood Culture ID Panel (Reflexed)     Status: Abnormal   Collection Time: 07/01/21  3:08 PM  Result Value Ref Range Status   Enterococcus faecalis NOT DETECTED NOT DETECTED Final   Enterococcus Faecium NOT DETECTED NOT DETECTED Final   Listeria monocytogenes NOT DETECTED NOT DETECTED Final   Staphylococcus species NOT DETECTED NOT DETECTED Final   Staphylococcus aureus (BCID) NOT DETECTED NOT DETECTED Final   Staphylococcus epidermidis NOT DETECTED NOT DETECTED Final   Staphylococcus lugdunensis NOT DETECTED NOT DETECTED Final   Streptococcus species DETECTED (A)  NOT DETECTED Final    Comment: Not Enterococcus species, Streptococcus agalactiae, Streptococcus pyogenes, or Streptococcus pneumoniae. CRITICAL RESULT CALLED TO, READ BACK BY AND VERIFIED WITH: C AMEND,PHARMD'@0645'$  06/29/21 [place]    Streptococcus agalactiae NOT DETECTED NOT DETECTED Final   Streptococcus pneumoniae NOT DETECTED NOT DETECTED Final   Streptococcus pyogenes NOT DETECTED NOT DETECTED Final   A.calcoaceticus-baumannii NOT DETECTED NOT DETECTED Final   Bacteroides fragilis NOT DETECTED NOT DETECTED Final   Enterobacterales NOT DETECTED NOT DETECTED Final   Enterobacter cloacae complex NOT DETECTED NOT DETECTED Final   Escherichia coli NOT DETECTED NOT DETECTED Final   Klebsiella aerogenes NOT DETECTED NOT DETECTED Final   Klebsiella oxytoca NOT DETECTED NOT DETECTED Final   Klebsiella pneumoniae NOT DETECTED NOT DETECTED Final   Proteus species NOT DETECTED NOT DETECTED Final   Salmonella species NOT DETECTED NOT DETECTED Final   Serratia marcescens NOT DETECTED NOT DETECTED Final   Haemophilus influenzae NOT DETECTED NOT DETECTED Final   Neisseria meningitidis NOT DETECTED NOT DETECTED Final   Pseudomonas aeruginosa NOT DETECTED NOT DETECTED Final   Stenotrophomonas maltophilia NOT DETECTED NOT DETECTED Final   Candida albicans NOT DETECTED NOT DETECTED Final   Candida auris NOT DETECTED NOT DETECTED Final   Candida glabrata NOT DETECTED NOT DETECTED Final   Candida krusei NOT DETECTED NOT DETECTED Final   Candida parapsilosis NOT DETECTED NOT DETECTED Final   Candida tropicalis NOT DETECTED NOT DETECTED Final   Cryptococcus neoformans/gattii NOT DETECTED NOT DETECTED Final    Comment: Performed at [facility], [address] [place], [place] [address]  Resp Panel by RT-PCR (Flu A&B, Covid) Nasopharyngeal Swab     Status: None   Collection Time: 07/01/21  3:15 PM   Specimen: Nasopharyngeal Swab; Nasopharyngeal(NP) swabs in vial transport medium  Result Value Ref  Range Status   SARS Coronavirus 2 by RT PCR NEGATIVE NEGATIVE Final    Comment: (NOTE) SARS-CoV-2 target nucleic acids are NOT DETECTED.  The SARS-CoV-2 RNA is generally detectable  in upper respiratory specimens during the acute phase of infection. The lowest concentration of SARS-CoV-2 viral copies this assay can detect is 138 copies/mL. A negative result does not preclude SARS-Cov-2 infection and should not be used as the sole basis for treatment or other patient management decisions. A negative result may occur with  improper specimen collection/handling, submission of specimen other than nasopharyngeal swab, presence of viral mutation(s) within the areas targeted by this assay, and inadequate number of viral copies(<138 copies/mL). A negative result must be combined with clinical observations, patient history, and epidemiological information. The expected result is Negative.  Fact Sheet for Patients:  EntrepreneurPulse.com.au  Fact Sheet for Healthcare Providers:  IncredibleEmployment.be  This test is no t yet approved or cleared by the [place] FDA and  has been authorized for detection and/or diagnosis of SARS-CoV-2 by FDA under an Emergency Use Authorization (EUA). This EUA will remain  in effect (meaning this test can be used) for the duration of the COVID-19 declaration under Section 564(b)(1) of the Act, 21 U.S.C.section 360bbb-3(b)(1), unless the authorization is terminated  or revoked sooner.       Influenza A by PCR NEGATIVE NEGATIVE Final   Influenza B by PCR NEGATIVE NEGATIVE Final    Comment: (NOTE) The Xpert Xpress SARS-CoV-2/FLU/RSV plus assay is intended as an aid in the diagnosis of influenza from Nasopharyngeal swab specimens and should not be used as a sole basis for treatment. Nasal washings and aspirates are unacceptable for Xpert Xpress SARS-CoV-2/FLU/RSV testing.  Fact Sheet for  Patients: EntrepreneurPulse.com.au  Fact Sheet for Healthcare Providers: IncredibleEmployment.be  This test is not yet approved or cleared by the [place] FDA and has been authorized for detection and/or diagnosis of SARS-CoV-2 by FDA under an Emergency Use Authorization (EUA). This EUA will remain in effect (meaning this test can be used) for the duration of the COVID-19 declaration under Section 564(b)(1) of the Act, 21 U.S.C. section 360bbb-3(b)(1), unless the authorization is terminated or revoked.  Performed at [facility], [place] [address], [place], [place] [address]   Blood Culture (routine x 2)     Status: None (Preliminary result)   Collection Time: 07/01/21  3:40 PM   Specimen: BLOOD LEFT ARM  Result Value Ref Range Status   Specimen Description BLOOD LEFT ARM  Final   Special Requests   Final    BOTTLES DRAWN AEROBIC AND ANAEROBIC Blood Culture adequate volume   Culture  Setup Time   Final    GRAM POSITIVE COCCI IN CHAINS IN BOTH AEROBIC AND ANAEROBIC BOTTLES CRITICAL VALUE NOTED.  VALUE IS CONSISTENT WITH PREVIOUSLY REPORTED AND CALLED VALUE.    Culture   Final    NO GROWTH < 24 HOURS Performed at [facility], [place] [address] [place], [place] [address]    Report Status PENDING  Incomplete    Oswald Hillock   [facility] If 7PM-7AM, please contact night-coverage at [email or web address], Office  [phone]   07/02/2021, 6:05 PM  LOS: 1 day

## 2021-07-03 DIAGNOSIS — N179 Acute kidney failure, unspecified: Secondary | ICD-10-CM | POA: Diagnosis not present

## 2021-07-03 DIAGNOSIS — I5022 Chronic systolic (congestive) heart failure: Secondary | ICD-10-CM | POA: Diagnosis not present

## 2021-07-03 DIAGNOSIS — I96 Gangrene, not elsewhere classified: Secondary | ICD-10-CM | POA: Diagnosis not present

## 2021-07-03 DIAGNOSIS — I10 Essential (primary) hypertension: Secondary | ICD-10-CM | POA: Diagnosis not present

## 2021-07-03 LAB — PATHOLOGIST SMEAR REVIEW

## 2021-07-03 LAB — GLUCOSE, CAPILLARY
Glucose-Capillary: 103 mg/dL — ABNORMAL HIGH (ref 70–99)
Glucose-Capillary: 104 mg/dL — ABNORMAL HIGH (ref 70–99)
Glucose-Capillary: 71 mg/dL (ref 70–99)
Glucose-Capillary: 77 mg/dL (ref 70–99)

## 2021-07-03 LAB — PROCALCITONIN: Procalcitonin: 4.6 ng/mL

## 2021-07-03 MED ORDER — ENOXAPARIN SODIUM 80 MG/0.8ML IJ SOSY
80.0000 mg | PREFILLED_SYRINGE | Freq: Two times a day (BID) | INTRAMUSCULAR | Status: AC
Start: 2021-07-03 — End: 2021-07-05
  Administered 2021-07-03 – 2021-07-05 (×5): 80 mg via SUBCUTANEOUS
  Filled 2021-07-03 (×5): qty 0.8

## 2021-07-03 NOTE — Progress Notes (Signed)
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 In the ED COVID-19 was negative, WBC 19.7, he was febrile with temperature 102.3.  Code sepsis was initiated.  Patient received 30 cc/kg IV fluids per sepsis protocol.  Lactic acid was 2.0.  Blood cultures x2 obtained.    Subjective   Denies any complaints, awaiting for surgery on Tuesday.   Assessment/Plan:    Sepsis -Patient presented with sepsis with acute kidney injury -Likely source of infection is left foot cellulitis, infected wounds -Blood culture growing Streptococcus, started on ceftriaxone -We will also continue with Flagyl as patient has foul-smelling discharge from left foot   Left foot gangrene/peripheral vascular disease -Patient has severe peripheral vascular disease -He was recommended to have left BKA by vascular surgery in the past but he was refusing -Now he is agreeable for amputation -Vascular surgery has been consulted, plan for above-knee amputation on 07/07/2021 -We will change Eliquis to Lovenox -We will hold Lovenox after the last dose on Sunday in anticipation for surgery on Tuesday  Altered mental status -Resolved; likely from above -At baseline  Acute kidney injury -Patient baseline creatinine 1.16, presented with creatinine of 1.79 -Creatinine improved today to 1.68 -He has nephrostomy tube in place; CT scan shows left nephrostomy tube with no complicating features, resolved  hydronephrosis -Avoid nephrotoxic agents -Cannot give IV fluids due to history of underlying CHF -Follow BMP in am  Chronic systolic heart failure -Patient's EF is 25 to 30% -We will avoid giving IV fluids  Diabetes mellitus type 2 -CBG well controlled -Continue sliding scale insulin with NovoLog  Pulmonary embolism 04/2021 -Continue Lovenox full dose per pharmacy  Nephrostomy status -Patient has nephrostomy tube replaced on 06/07/2021 after he pulled it out -CT renal stone study shows nephrostomy tube without complications  Hypotension -Patient is on midodrine 10 mg p.o. 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  -Amiodarone 200 mg p.o. daily  -Eliquis has been changed to Lovenox full dose as above  Scheduled medications:    amiodarone  200 mg Oral Daily   enoxaparin (LOVENOX) injection  80 mg Subcutaneous Q12H   insulin aspart  0-9 Units Subcutaneous Q6H   midodrine  10 mg Oral TID WC     Data Reviewed:   CBG:  Recent Labs  Lab 07/02/21 1954 07/02/21 2340 07/03/21 0013 07/03/21 0613 07/03/21 1127  GLUCAP 93 68* 77 71 103*    SpO2: 98 %    Vitals:   07/02/21 1954 07/03/21 0814 07/03/21 1200 07/03/21 1336  BP: (!) 121/52 (!) 143/63  131/74  Pulse: 73 73  81  Resp: '19 14  20  '$ Temp: 98 F (36.7 C) 98.4 F (36.9 C)  98.7 F (37.1 C)  TempSrc: Oral Oral  Oral  SpO2: 97% 99%  98%  Weight:   78 kg      Intake/Output Summary (Last 24 hours) at 07/03/2021 1637 Last data filed at 07/03/2021 1325 Gross per 24 hour  Intake 740 ml  Output 700 ml  Net 40 ml    10/12 1901 - 10/14 0700 In: 1304.1 [I.V.:999] Out: 1100 [Urine:1100]  Filed Weights   07/03/21 1200  Weight: 78 kg    Data Reviewed: Basic Metabolic Panel: Recent Labs  Lab 07/01/21 1625 07/01/21 2034 07/02/21 0219  NA 140 140 139  K 4.2 4.4 3.8  CL 107 108 109  CO2 '23 24 23  '$ GLUCOSE 114* 113* 98  BUN 35* 33* 34*  CREATININE 1.79*  1.60* 1.68*  CALCIUM 8.6* 8.5* 8.5*  MG  --   --  1.8   Liver Function Tests: Recent Labs  Lab 07/01/21 1625 07/01/21 2034 07/02/21 0219  AST 26 31 40  ALT '11 13 15  '$ ALKPHOS 96 91 84  BILITOT 0.9 1.1 1.5*  PROT 5.9* 6.3* 5.8*  ALBUMIN 2.0* 2.1* 1.9*   No results for input(s): LIPASE, AMYLASE in the last 168 hours. No results for input(s): AMMONIA in the last 168 hours. CBC: Recent Labs  Lab 07/01/21 1508 07/02/21 0219  WBC 19.7* 17.4*  NEUTROABS 18.3* 16.4*  HGB 10.6* 8.3*  HCT 35.5* 27.1*  MCV 73.2* 71.7*  PLT 236 176   Cardiac Enzymes: No results for input(s): CKTOTAL, CKMB, CKMBINDEX, TROPONINI in the last 168 hours. BNP (last 3 results) Recent Labs    04/28/21 0116 04/29/21 0826 05/02/21 2338  BNP 1,706.1* 2,208.9* 1,209.2*    ProBNP (last 3 results) Recent Labs    08/25/20 1121 03/27/21 1123  PROBNP 21,395* 3,146*    CBG: Recent Labs  Lab 07/02/21 1954 07/02/21 2340 07/03/21 0013 07/03/21 0613 07/03/21 1127  GLUCAP 93 68* 77 71 103*       Radiology Reports  CT Renal Stone Study  Result Date: 07/02/2021 CLINICAL DATA:  Left flank pain EXAM: CT ABDOMEN AND PELVIS WITHOUT CONTRAST TECHNIQUE: Multidetector CT imaging of the abdomen and pelvis was performed following the standard protocol without IV contrast. COMPARISON:  06/06/2021 FINDINGS: Lower chest: Scarring and cylindrical bronchiectasis at the bases. Cardiomegaly and minimally covered pacer lead. Hepatobiliary: No focal liver abnormality.Cholelithiasis. Full gallbladder without visible inflammation. Pancreas: Unremarkable. Spleen: Unremarkable. Adrenals/Urinary Tract: Negative adrenals. Left percutaneous nephrostomy tube in unremarkable position. No hydronephrosis, perinephric stranding, or post procedural hemorrhage. Pre-existing organized hematoma in the left retroperitoneum has clearly decreased in size. Pneumaturia with chronic bladder wall indistinctness and mild thickening.  Stomach/Bowel: Desiccated high-density stool in the distal colon with rectal over distension measuring 8.5 cm. No appendicitis. Vascular/Lymphatic: No acute vascular abnormality. Atheromatous plaque which is multifocal on the aorta. No mass or adenopathy. Reproductive:No pathologic findings. Other: No ascites or pneumoperitoneum. Musculoskeletal: No acute abnormalities. Generalized lumbar spine degeneration with remote endplate fractures at L2, L3, and L5. IMPRESSION: 1. Interval left percutaneous nephrostomy tube with no complicating feature. Resolved hydronephrosis. 2. Pneumaturia which can be correlated with urinalysis. 3. Decreasing left retroperitoneal hematoma. 4. Stool distended rectum measuring up to 8.5 cm diameter. 5. Cholelithiasis without cholecystitis. Electronically Signed   By: Jorje Guild M.D.   On: 07/02/2021 04:44       Antibiotics: Anti-infectives (From admission, onward)    Start     Dose/Rate Route Frequency Ordered Stop   07/02/21 1900  vancomycin (VANCOCIN) IVPB 1000 mg/200 mL premix  Status:  Discontinued        1,000 mg 200 mL/hr over 60 Minutes Intravenous Every 24 hours 07/01/21 1834 07/02/21 0656   07/02/21 1000  ceFEPIme (MAXIPIME) 2 g in sodium chloride 0.9 % 100 mL IVPB  Status:  Discontinued  2 g 200 mL/hr over 30 Minutes Intravenous Every 12 hours 07/01/21 1834 07/02/21 0656   07/02/21 0800  metroNIDAZOLE (FLAGYL) IVPB 500 mg        500 mg 100 mL/hr over 60 Minutes Intravenous Every 12 hours 07/01/21 2342     07/02/21 0800  cefTRIAXone (ROCEPHIN) 2 g in sodium chloride 0.9 % 100 mL IVPB        2 g 200 mL/hr over 30 Minutes Intravenous Every 24 hours 07/02/21 0656     07/01/21 1545  ceFEPIme (MAXIPIME) 2 g in sodium chloride 0.9 % 100 mL IVPB        2 g 200 mL/hr over 30 Minutes Intravenous  Once 07/01/21 1537 07/01/21 1756   07/01/21 1545  vancomycin (VANCOREADY) IVPB 1500 mg/300 mL        1,500 mg 150 mL/hr over 120 Minutes Intravenous  Once  07/01/21 1538 07/01/21 2000   07/01/21 1515  metroNIDAZOLE (FLAGYL) IVPB 500 mg        500 mg 100 mL/hr over 60 Minutes Intravenous  Once 07/01/21 1505 07/01/21 1641         DVT prophylaxis: Apixaban  Code Status: Full code  Family Communication: No family at bedside   Consultants:   Procedures:     Objective    Physical Examination:  General-appears in no acute distress Heart-S1-S2, regular, no murmur auscultated Lungs-clear to auscultation bilaterally, no wheezing or crackles auscultated Abdomen-soft, nontender, no organomegaly Extremities-s/p right AKA, left lower extremity in dressing  Neuro-alert, oriented x3, no focal deficit noted   Status is: Inpatient  Dispo: The patient is from: Home              Anticipated d/c is to: Skilled nursing facility              Anticipated d/c date is: 07/10/2021              Patient currently not stable for discharge  Barrier to discharge-ongoing valuation for sepsis, awaiting amputation of left lower extremity  COVID-19 Labs  No results for input(s): DDIMER, FERRITIN, LDH, CRP in the last 72 hours.  Lab Results  Component Value Date   SARSCOV2NAA NEGATIVE 07/01/2021   SARSCOV2NAA NEGATIVE 06/06/2021   SARSCOV2NAA POSITIVE (A) 05/21/2021   SARSCOV2NAA POSITIVE (A) 04/28/2021     Pressure Injury 04/21/21 Coccyx Medial Stage 2 -  Partial thickness loss of dermis presenting as a shallow open injury with a red, pink wound bed without slough. (Active)  04/21/21 1255  Location: Coccyx  Location Orientation: Medial  Staging: Stage 2 -  Partial thickness loss of dermis presenting as a shallow open injury with a red, pink wound bed without slough.  Wound Description (Comments):   Present on Admission: No        Recent Results (from the past 240 hour(s))  Urine Culture     Status: Abnormal (Preliminary result)   Collection Time: 07/01/21  3:05 PM   Specimen: Urine, Catheterized  Result Value Ref Range Status    Specimen Description URINE, CATHETERIZED  Final   Special Requests NONE  Final   Culture (A)  Final    >=100,000 COLONIES/mL GRAM NEGATIVE RODS SUSCEPTIBILITIES TO FOLLOW Performed at [facility], [address], [place], [place] [address]    Report Status PENDING  Incomplete  Blood Culture (routine x 2)     Status: None (Preliminary result)   Collection Time: 07/01/21  3:08 PM   Specimen: BLOOD  Result Value  Ref Range Status   Specimen Description BLOOD SITE NOT SPECIFIED  Final   Special Requests   Final    BOTTLES DRAWN AEROBIC AND ANAEROBIC Blood Culture adequate volume   Culture  Setup Time   Final    GRAM POSITIVE COCCI IN CHAINS IN BOTH AEROBIC AND ANAEROBIC BOTTLES CRITICAL RESULT CALLED TO, READ BACK BY AND VERIFIED WITH: PHARMD C.AMEND AT [place] 07/02/2021 BY [place].    Culture   Final    GRAM POSITIVE COCCI IDENTIFICATION AND SUSCEPTIBILITIES TO FOLLOW Performed at [facility], [place] [address], [place], [place] [address]    Report Status PENDING  Incomplete  Blood Culture ID Panel (Reflexed)     Status: Abnormal   Collection Time: 07/01/21  3:08 PM  Result Value Ref Range Status   Enterococcus faecalis NOT DETECTED NOT DETECTED Final   Enterococcus Faecium NOT DETECTED NOT DETECTED Final   Listeria monocytogenes NOT DETECTED NOT DETECTED Final   Staphylococcus species NOT DETECTED NOT DETECTED Final   Staphylococcus aureus (BCID) NOT DETECTED NOT DETECTED Final   Staphylococcus epidermidis NOT DETECTED NOT DETECTED Final   Staphylococcus lugdunensis NOT DETECTED NOT DETECTED Final   Streptococcus species DETECTED (A) NOT DETECTED Final    Comment: Not Enterococcus species, Streptococcus agalactiae, Streptococcus pyogenes, or Streptococcus pneumoniae. CRITICAL RESULT CALLED TO, READ BACK BY AND VERIFIED WITH: C AMEND,PHARMD'@[email or web address]'$  06/29/21 [place]    Streptococcus agalactiae NOT DETECTED NOT DETECTED Final   Streptococcus pneumoniae NOT DETECTED NOT DETECTED  Final   Streptococcus pyogenes NOT DETECTED NOT DETECTED Final   A.calcoaceticus-baumannii NOT DETECTED NOT DETECTED Final   Bacteroides fragilis NOT DETECTED NOT DETECTED Final   Enterobacterales NOT DETECTED NOT DETECTED Final   Enterobacter cloacae complex NOT DETECTED NOT DETECTED Final   Escherichia coli NOT DETECTED NOT DETECTED Final   Klebsiella aerogenes NOT DETECTED NOT DETECTED Final   Klebsiella oxytoca NOT DETECTED NOT DETECTED Final   Klebsiella pneumoniae NOT DETECTED NOT DETECTED Final   Proteus species NOT DETECTED NOT DETECTED Final   Salmonella species NOT DETECTED NOT DETECTED Final   Serratia marcescens NOT DETECTED NOT DETECTED Final   Haemophilus influenzae NOT DETECTED NOT DETECTED Final   Neisseria meningitidis NOT DETECTED NOT DETECTED Final   Pseudomonas aeruginosa NOT DETECTED NOT DETECTED Final   Stenotrophomonas maltophilia NOT DETECTED NOT DETECTED Final   Candida albicans NOT DETECTED NOT DETECTED Final   Candida auris NOT DETECTED NOT DETECTED Final   Candida glabrata NOT DETECTED NOT DETECTED Final   Candida krusei NOT DETECTED NOT DETECTED Final   Candida parapsilosis NOT DETECTED NOT DETECTED Final   Candida tropicalis NOT DETECTED NOT DETECTED Final   Cryptococcus neoformans/gattii NOT DETECTED NOT DETECTED Final    Comment: Performed at [facility], [address], [place], [place] [address]  Resp Panel by RT-PCR (Flu A&B, Covid) Nasopharyngeal Swab     Status: None   Collection Time: 07/01/21  3:15 PM   Specimen: Nasopharyngeal Swab; Nasopharyngeal(NP) swabs in vial transport medium  Result Value Ref Range Status   SARS Coronavirus 2 by RT PCR NEGATIVE NEGATIVE Final    Comment: (NOTE) SARS-CoV-2 target nucleic acids are NOT DETECTED.  The SARS-CoV-2 RNA is generally detectable in upper respiratory specimens during the acute phase of infection. The lowest concentration of SARS-CoV-2 viral copies this assay can detect is 138 copies/mL.  A negative result does not preclude SARS-Cov-2 infection and should not be used as the sole basis for treatment or other patient  management decisions. A negative result may occur with  improper specimen collection/handling, submission of specimen other than nasopharyngeal swab, presence of viral mutation(s) within the areas targeted by this assay, and inadequate number of viral copies(<138 copies/mL). A negative result must be combined with clinical observations, patient history, and epidemiological information. The expected result is Negative.  Fact Sheet for Patients:  EntrepreneurPulse.com.au  Fact Sheet for Healthcare Providers:  IncredibleEmployment.be  This test is no t yet approved or cleared by the [place] FDA and  has been authorized for detection and/or diagnosis of SARS-CoV-2 by FDA under an Emergency Use Authorization (EUA). This EUA will remain  in effect (meaning this test can be used) for the duration of the COVID-19 declaration under Section 564(b)(1) of the Act, 21 U.S.C.section 360bbb-3(b)(1), unless the authorization is terminated  or revoked sooner.       Influenza A by PCR NEGATIVE NEGATIVE Final   Influenza B by PCR NEGATIVE NEGATIVE Final    Comment: (NOTE) The Xpert Xpress SARS-CoV-2/FLU/RSV plus assay is intended as an aid in the diagnosis of influenza from Nasopharyngeal swab specimens and should not be used as a sole basis for treatment. Nasal washings and aspirates are unacceptable for Xpert Xpress SARS-CoV-2/FLU/RSV testing.  Fact Sheet for Patients: EntrepreneurPulse.com.au  Fact Sheet for Healthcare Providers: IncredibleEmployment.be  This test is not yet approved or cleared by the [place] FDA and has been authorized for detection and/or diagnosis of SARS-CoV-2 by FDA under an Emergency Use Authorization (EUA). This EUA will remain in effect (meaning this test  can be used) for the duration of the COVID-19 declaration under Section 564(b)(1) of the Act, 21 U.S.C. section 360bbb-3(b)(1), unless the authorization is terminated or revoked.  Performed at [facility], [place] [address] [place], [place] [address]   Blood Culture (routine x 2)     Status: None (Preliminary result)   Collection Time: 07/01/21  3:40 PM   Specimen: BLOOD LEFT ARM  Result Value Ref Range Status   Specimen Description BLOOD LEFT ARM  Final   Special Requests   Final    BOTTLES DRAWN AEROBIC AND ANAEROBIC Blood Culture adequate volume   Culture  Setup Time   Final    GRAM POSITIVE COCCI IN CHAINS IN BOTH AEROBIC AND ANAEROBIC BOTTLES CRITICAL VALUE NOTED.  VALUE IS CONSISTENT WITH PREVIOUSLY REPORTED AND CALLED VALUE.    Culture   Final    GRAM POSITIVE COCCI IDENTIFICATION TO FOLLOW Performed at [facility], [place] [address], [place], [place] [address]    Report Status PENDING  Incomplete    Oswald Hillock   [facility] If 7PM-7AM, please contact night-coverage at [email or web address], Office  [phone]   07/03/2021, 4:37 PM  LOS: 2 days

## 2021-07-03 NOTE — Progress Notes (Signed)
ANTICOAGULATION CONSULT NOTE - Initial Consult  Pharmacy Consult for lovenox Indication: pulmonary embolus  Allergies  Allergen Reactions   Tuna [Fish Allergy] Nausea And Vomiting    Patient Measurements: Weight: 78 kg (172 lb) Heparin Dosing Weight:   Vital Signs: Temp: 98.4 F (36.9 C) (10/14 0814) Temp Source: Oral (10/14 0814) BP: 143/63 (10/14 0814) Pulse Rate: 73 (10/14 0814)  Labs: Recent Labs    07/01/21 1508 07/01/21 1625 07/01/21 2034 07/02/21 0219  HGB 10.6*  --   --  8.3*  HCT 35.5*  --   --  27.1*  PLT 236  --   --  176  APTT  --   --  42*  --   LABPROT  --   --  27.2*  --   INR  --   --  2.5*  --   CREATININE  --  1.79* 1.60* 1.68*    Estimated Creatinine Clearance: 40.4 mL/min (A) (by C-G formula based on SCr of 1.68 mg/dL (H)).   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   Past Week   Amino Acids-Protein Hydrolys (FEEDING SUPPLEMENT, PRO-STAT SUGAR FREE 64,) LIQD Take 30 mLs by mouth 3 (three) times daily. Take with 4 oz juice   07/01/2021   amiodarone (PACERONE) 200 MG tablet TAKE 1 TABLET BY MOUTH EVERY DAY (Patient taking differently: Take 200 mg by mouth daily.)  90 tablet 3 07/01/2021   apixaban (ELIQUIS) 5 MG TABS tablet Take 1 tablet (5 mg total) by mouth every 12 (twelve) hours. 60 tablet  07/01/2021 at 0900   atorvastatin (LIPITOR) 40 MG tablet Take 1 tablet (40 mg total) by mouth daily at 6 PM. (Patient taking differently: Take 40 mg by mouth at bedtime.) 30 tablet 5 06/30/2021   bisacodyl (DULCOLAX) 5 MG EC tablet Take 5 mg by mouth daily as needed (constipation).   unk   docusate sodium (COLACE) 100 MG capsule Take 100 mg by mouth daily.   07/01/2021   empagliflozin (JARDIANCE) 10 MG TABS tablet Take 1 tablet (10 mg total) by mouth daily. 90 tablet 3 07/01/2021   Ensure (ENSURE) Take 237 mLs by mouth in the morning, at noon, and at bedtime.   07/01/2021   furosemide (LASIX) 40 MG tablet Take 40 mg by mouth daily.   06/30/2021   gabapentin (NEURONTIN) 300 MG capsule Take 300 mg by mouth at bedtime.   06/30/2021   guaiFENesin (ROBITUSSIN) 100 MG/5ML SOLN Take 100 mg by mouth every 4 (four) hours as needed for cough.   unk   HYDROcodone-acetaminophen (NORCO) 7.5-325 MG tablet Take 1 tablet by mouth every 6 (six) hours as needed for severe pain (in left  foot).   07/01/2021   midodrine (PROAMATINE) 10 MG tablet Take 1 tablet (10 mg total) by mouth 3 (three) times daily with meals.   07/01/2021   pantoprazole (PROTONIX) 40 MG tablet Take 40 mg by mouth daily.   07/01/2021   triamcinolone cream (KENALOG) 0.5 % Apply 1 application topically daily as needed (skin rash).    unk   Scheduled:   amiodarone  200 mg Oral Daily   enoxaparin (LOVENOX) injection  80 mg Subcutaneous Q12H   insulin aspart  0-9 Units Subcutaneous Q6H   midodrine  10 mg Oral TID WC    Assessment: Pt has been on apixaban for a hx of PE. He has a plan BKA on 10/18. D/w Dr. Darrick Meigs and we will transition him to SQ lovenox for bridging. Dr. Darrick Meigs would like the lovenox to be stopped on Sunday night.   Goal of Therapy:  Anti-Xa level 0.6-1 units/ml 4hrs after LMWH dose given Monitor  platelets by anticoagulation protocol: Yes   Plan:  Dc apixaban Lovenox '80mg'$  BID x5 dose F/u resume apixaban post [place], PharmD, BCIDP, AAHIVP, CPP Infectious Disease Pharmacist 07/03/2021 1:03 PM

## 2021-07-03 NOTE — Consult Note (Signed)
   [facility] CM Inpatient Consult   07/03/2021  Ryan [name] 1946-01-16 [id number]  [place] [facility] [ACO] Patient: [name]  Primary Care Provider:  [name], MD, [facility] does the Transition of Care [TOC]  Patient screened for hospitalization with less than 3 days readmission noted with high risk score for unplanned readmission risk and to assess for potential [place] Management service needs for post hospital transition.  Review of patient's medical record reveals patient is currently planned for a surgical intervention. Patient recently from a [place] level of care.  Plan:  Continue to follow progress and disposition to assess for post hospital care management needs.    For questions contact:   Natividad Brood, RN BSN [facility] Liaison  [phone] business mobile phone Toll free office [phone]  Fax number: [phone] [email or web address]

## 2021-07-04 DIAGNOSIS — I5022 Chronic systolic (congestive) heart failure: Secondary | ICD-10-CM | POA: Diagnosis not present

## 2021-07-04 DIAGNOSIS — N179 Acute kidney failure, unspecified: Secondary | ICD-10-CM | POA: Diagnosis not present

## 2021-07-04 DIAGNOSIS — I96 Gangrene, not elsewhere classified: Secondary | ICD-10-CM | POA: Diagnosis not present

## 2021-07-04 DIAGNOSIS — I10 Essential (primary) hypertension: Secondary | ICD-10-CM | POA: Diagnosis not present

## 2021-07-04 LAB — GLUCOSE, CAPILLARY
Glucose-Capillary: 106 mg/dL — ABNORMAL HIGH (ref 70–99)
Glucose-Capillary: 77 mg/dL (ref 70–99)
Glucose-Capillary: 86 mg/dL (ref 70–99)
Glucose-Capillary: 88 mg/dL (ref 70–99)
Glucose-Capillary: 88 mg/dL (ref 70–99)
Glucose-Capillary: 92 mg/dL (ref 70–99)

## 2021-07-04 LAB — CULTURE, BLOOD (ROUTINE X 2)
Special Requests: ADEQUATE
Special Requests: ADEQUATE

## 2021-07-04 NOTE — Evaluation (Addendum)
Physical Therapy Evaluation Patient Details Name: Ryan [name] MRN: [id number] DOB: November 22, 1945 Today's Date: 07/04/2021  History of Present Illness  Pt is a 75 year old male admitted 10/12 with AMS, ischemic LLE with gangrene L foot. PMH of chronic systolic CHF, EF [id number], hypertension, diabetes mellitus type 2, CKD stage III, PVD s/p R AKA (04/2021), h/o pulmonary embolism on Eliquis, h/o CVA.   Clinical Impression  Pt admitted with above diagnosis. PTA pt was residing at [facility] active with therapy following his August 2022 R AKA. On eval, pt required min/mod assist bed mobility. Pt only able to tolerate sitting EOB a few seconds due to severe pain with L foot in dependent position. Pt returned to bed and assisted with positioning in R sidelying using pillows/blanket rolls for support. Pt will benefit from skilled PT to increase their independence and safety with mobility to allow discharge to the venue listed below.  Due to pain and pt inability to tolerate OOB, will defer further therapy session until pt undergoes L AKA on Tuesday, 10/18.        Recommendations for follow up therapy are one component of a multi-disciplinary discharge planning process, led by the attending physician.  Recommendations may be updated based on patient status, additional functional criteria and insurance authorization.  Follow Up Recommendations SNF    Equipment Recommendations  Other (comment) (defer to next venue)    Recommendations for Other Services       Precautions / Restrictions Precautions Precautions: Fall;Other (comment) Precaution Comments: nephrostomy bag      Mobility  Bed Mobility Overal bed mobility: Needs Assistance Bed Mobility: Supine to Sit;Sit to Supine;Rolling Rolling: Min assist   Supine to sit: Mod assist;HOB elevated Sit to supine: Min assist   General bed mobility comments: +rail, increased time due to pain    Transfers                 General transfer comment:  unable to progress beyond EOB due to pain with L foot in dependent position. Only able to tolerate sitting a few seconds.  Ambulation/Gait             General Gait Details: nonamb at baseline due to recent R AKA  Stairs            Wheelchair Mobility    Modified Rankin (Stroke Patients Only)       Balance                                             Pertinent Vitals/Pain Pain Assessment: 0-10 Pain Score: 8  Pain Location: Left foot Pain Descriptors / Indicators: Grimacing;Guarding;Throbbing;Pressure Pain Intervention(s): Limited activity within patient's tolerance;Patient requesting pain meds-RN notified;Monitored during session;Repositioned    Home Living Family/patient expects to be discharged to:: Skilled nursing facility                 Additional Comments: Pt was living with his daughter prior to previous hospitalization (July-Sept 2022). Since d/c from hospital, pt has been at [facility] for therapy.    Prior Function Level of Independence: Needs assistance   Gait / Transfers Assistance Needed: was active with PT at SNF PTA  ADL's / Homemaking Assistance Needed: assist from SNF staff  Comments: Prior to R AKA in August 2022, pt ambulated mod I with cane vs rollator.     Hand Dominance   Dominant  Hand: Right    Extremity/Trunk Assessment   Upper Extremity Assessment Upper Extremity Assessment: Overall WFL for tasks assessed    Lower Extremity Assessment Lower Extremity Assessment: RLE deficits/detail;LLE deficits/detail RLE Deficits / Details: h/o AKA LLE: Unable to fully assess due to pain       Communication   Communication: No difficulties  Cognition Arousal/Alertness: Awake/alert Behavior During Therapy: WFL for tasks assessed/performed Overall Cognitive Status: Within Functional Limits for tasks assessed                                        General Comments      Exercises     Assessment/Plan     PT Assessment Patient needs continued PT services  PT Problem List Decreased strength;Decreased mobility;Decreased activity tolerance;Pain;Decreased balance       PT Treatment Interventions Therapeutic activities;DME instruction;Therapeutic exercise;Patient/family education;Balance training;Functional mobility training    PT Goals (Current goals can be found in the Care Plan section)  Acute Rehab PT Goals Patient Stated Goal: decrease pain PT Goal Formulation: With patient Time For Goal Achievement: 07/18/21 Potential to Achieve Goals: Fair    Frequency Min 2X/week   Barriers to discharge        Co-evaluation               AM-PAC PT "6 Clicks" Mobility  Outcome Measure Help needed turning from your back to your side while in a flat bed without using bedrails?: A Little Help needed moving from lying on your back to sitting on the side of a flat bed without using bedrails?: A Lot Help needed moving to and from a bed to a chair (including a wheelchair)?: Total Help needed standing up from a chair using your arms (e.g., wheelchair or bedside chair)?: Total Help needed to walk in hospital room?: Total Help needed climbing 3-5 steps with a railing? : Total 6 Click Score: 9    End of Session   Activity Tolerance: Patient limited by pain Patient left: in bed;with call bell/phone within reach;with bed alarm set Nurse Communication: Patient requests pain meds PT Visit Diagnosis: Other abnormalities of gait and mobility (R26.89);Pain Pain - Right/Left: Left Pain - part of body: Ankle and joints of foot    Time: [id number] PT Time Calculation (min) (ACUTE ONLY): 20 min   Charges:   PT Evaluation $PT Eval Moderate Complexity: 1 Mod          Lorrin Goodell, PT  Office # [phone] Pager [phone]   Lorriane Shire 07/04/2021, 3:20 PM

## 2021-07-04 NOTE — Evaluation (Signed)
Clinical/Bedside Swallow Evaluation Patient Details  Name: Ryan [name] MRN: [id number] Date of Birth: Dec 29, 1945  Today's Date: 07/04/2021 Time: SLP Start Time (ACUTE ONLY): 0910 SLP Stop Time (ACUTE ONLY): 0865 SLP Time Calculation (min) (ACUTE ONLY): 15 min  Past Medical History:  Past Medical History:  Diagnosis Date   Acute pericarditis 09/16/2012   Possible purulent pericarditis in setting of staph bacteremia   AICD (automatic cardioverter/defibrillator) present    Arthritis    Atrial fibrillation (HCC)    Cardiomyopathy, dilated (HCC)    CHF (congestive heart failure) (HCC)    Diabetes mellitus without complication ([place])    DVT of left external iliac vein 04/2021 06/06/2021   Endocarditis    Hypertension    Peripheral vascular disease ([place])    Pulmonary emboli 04/2021 06/06/2021   PVC's (premature ventricular contractions)    SVT (supraventricular tachycardia)  long RP    Past Surgical History:  Past Surgical History:  Procedure Laterality Date   ABDOMINAL AORTAGRAM N/A 10/03/2012   Procedure: ABDOMINAL Maxcine Ham;  Surgeon: [name], MD;  Location: [facility] CATH LAB;  Service: Cardiovascular;  Laterality: N/A;   ABDOMINAL AORTOGRAM W/LOWER EXTREMITY N/A 02/25/2021   Procedure: ABDOMINAL AORTOGRAM W/LOWER EXTREMITY;  Surgeon: Cherre Robins, MD;  Location: [place] CV LAB;  Service: Cardiovascular;  Laterality: N/A;   ABDOMINAL AORTOGRAM W/LOWER EXTREMITY N/A 04/13/2021   Procedure: ABDOMINAL AORTOGRAM W/LOWER EXTREMITY;  Surgeon: Waynetta Sandy, MD;  Location: [place] CV LAB;  Service: Cardiovascular;  Laterality: N/A;   AMPUTATION Right 05/12/2021   Procedure: AMPUTATION ABOVE KNEE RIGHT;  Surgeon: Angelia Mould, MD;  Location: [facility] OR;  Service: Vascular;  Laterality: Right;   APPLICATION OF WOUND VAC Right 04/09/2021   Procedure: APPLICATION OF WOUND VAC;  Surgeon: Cherre Robins, MD;  Location: [place];  Service: Vascular;  Laterality: Right;    BYPASS GRAFT FEMORAL-PERONEAL Right 03/06/2021   Procedure: RIGHT ABOVE KNEE POPLITEAL ARTERY-PERONEAL BYPASS;  Surgeon: Cherre Robins, MD;  Location: [place];  Service: Vascular;  Laterality: Right;   CORONARY ANGIOGRAM  09/21/2012   Procedure: CORONARY ANGIOGRAM;  Surgeon: [name], MD;  Location: [facility] CATH LAB;  Service: Cardiovascular;;   EP IMPLANTABLE DEVICE N/A 06/11/2016   Procedure: ICD Implant;  Surgeon: Will Meredith Leeds, MD;  Location: [place] CV LAB;  Service: Cardiovascular;  Laterality: N/A;   EXTREMITY WIRE/PIN REMOVAL  09/14/2012   Procedure: REMOVAL K-WIRE/PIN EXTREMITY;  Surgeon: [name], MD;  Location: [place];  Service: Orthopedics;  Laterality: Right;  Right Foot   I & D EXTREMITY  09/14/2012   Procedure: IRRIGATION AND DEBRIDEMENT EXTREMITY;  Surgeon: Tennis Must, MD;  Location: [place];  Service: Orthopedics;  Laterality: Right;   INTRAOPERATIVE TRANSESOPHAGEAL ECHOCARDIOGRAM  09/26/2012   Procedure: INTRAOPERATIVE TRANSESOPHAGEAL ECHOCARDIOGRAM;  Surgeon: [name], MD;  Location: [facility] OR;  Service: Open Heart Surgery;  Laterality: N/A;   IR FLUORO GUIDE CV LINE RIGHT  04/20/2021   IR NEPHROSTOMY EXCHANGE LEFT  06/07/2021   IR NEPHROSTOMY PLACEMENT LEFT  04/20/2021   IR US GUIDE VASC ACCESS RIGHT  04/20/2021   MITRAL VALVE REPLACEMENT  09/26/2012   Procedure: MITRAL VALVE (MV) REPLACEMENT;  Surgeon: [name], MD;  Location: [place];  Service: Open Heart Surgery;  Laterality: N/A;   RIGHT HEART CATH N/A 08/29/2020   Procedure: RIGHT HEART CATH;  Surgeon: Larey Dresser, MD;  Location: [place] CV LAB;  Service: Cardiovascular;  Laterality:  N/A;   RIGHT HEART CATHETERIZATION  09/21/2012   Procedure: RIGHT HEART CATH;  Surgeon: [name], MD;  Location: [facility] CATH LAB;  Service: Cardiovascular;;   RIGHT/LEFT HEART CATH AND CORONARY ANGIOGRAPHY N/A 01/30/2021   Procedure: RIGHT/LEFT HEART CATH AND CORONARY ANGIOGRAPHY;  Surgeon: Larey Dresser, MD;   Location: [place] CV LAB;  Service: Cardiovascular;  Laterality: N/A;   SVT ABLATION N/A 09/03/2020   Procedure: SVT ABLATION;  Surgeon: Constance Haw, MD;  Location: [place] CV LAB;  Service: Cardiovascular;  Laterality: N/A;   TEE WITHOUT CARDIOVERSION  09/18/2012   Procedure: TRANSESOPHAGEAL ECHOCARDIOGRAM (TEE);  Surgeon: [name], MD;  Location: [facility] ENDOSCOPY;  Service: Cardiovascular;  Laterality: N/A;   WOUND DEBRIDEMENT Right 04/09/2021   Procedure: DEBRIDEMENT RIGHT HEEL WOUND AND PARTIAL FIRST TOE AMPUTATION AND SECOND TOE AMPUTATION. Application of Myriad skin substitute;  Surgeon: Cherre Robins, MD;  Location: [facility] OR;  Service: Vascular;  Laterality: Right;   HPI:  75 year old male with history of chronic systolic CHF, EF 74%, hypertension, diabetes mellitus type 2, CKD stage III, PVD s/p right AKA, ischemic left leg with gangrene of left foot, history of pulmonary embolism on Eliquis, history of stroke.  Pt presented to  ED from skilled nursing facility due to reported altered mental status.  Pt has a hx of dysphagia and was most recently seen by ST on 05/19/21 with recommendations for Dysphagia 2 solids and thin liquids.    Assessment / Plan / Recommendation  Clinical Impression  Pt was seen for a bedside swallow evaluation and he presents with mild oral dysphagia.  Oral mech examination was unremarkable.  Pt consumed trials of thin liquid, puree, and regular solids.  He exhibited mildly prolonged mastication of regular solids, but mastication was effective and no oral residue was observed.  Recommend diet upgrade to Dysphagia 2 (fine chop) solids and thin liquids with medications administered whole in liquid.  SLP will f/u to monitor diet tolerance and upgrade solids as able.  SLP Visit Diagnosis: Dysphagia, oral phase (R13.11)    Aspiration Risk  Mild aspiration risk    Diet Recommendation Thin liquid;Dysphagia 2 (Fine chop)   Liquid Administration via:  Cup;Straw Medication Administration: Whole meds with liquid Supervision: Patient able to self feed Compensations: Small sips/bites Postural Changes: Seated upright at 90 degrees    Other  Recommendations Oral Care Recommendations: Oral care BID    Recommendations for follow up therapy are one component of a multi-disciplinary discharge planning process, led by the attending physician.  Recommendations may be updated based on patient status, additional functional criteria and insurance authorization.  Follow up Recommendations Other (comment) (TBD)      Frequency and Duration min 2x/week  2 weeks       Prognosis Prognosis for Safe Diet Advancement: Good      Swallow Study   General HPI: 75 year old male with history of chronic systolic CHF, EF 25%, hypertension, diabetes mellitus type 2, CKD stage III, PVD s/p right AKA, ischemic left leg with gangrene of left foot, history of pulmonary embolism on Eliquis, history of stroke.  Pt presented to  ED from skilled nursing facility due to reported altered mental status.  Pt has a hx of dysphagia and was most recently seen by ST on 05/19/21 with recommendations for Dysphagia 2 solids and thin liquids. Type of Study: Bedside Swallow Evaluation Previous Swallow Assessment: See HPI Diet Prior to this Study: Thin liquids;Dysphagia 1 (puree) Temperature Spikes Noted: No Respiratory  Status: Room air History of Recent Intubation: No Behavior/Cognition: Alert;Cooperative;Pleasant mood Oral Cavity Assessment: Within Functional Limits Oral Care Completed by SLP: No Oral Cavity - Dentition: Dentures, top;Dentures, bottom Vision: Functional for self-feeding Self-Feeding Abilities: Able to feed self Patient Positioning: Upright in bed Baseline Vocal Quality: Normal Volitional Cough: Strong Volitional Swallow: Able to elicit    Oral/Motor/Sensory Function Overall Oral Motor/Sensory Function: Within functional limits   Ice Chips Ice chips: Not  tested   Thin Liquid Thin Liquid: Within functional limits    Nectar Thick Nectar Thick Liquid: Not tested   Honey Thick Honey Thick Liquid: Not tested   Puree Puree: Within functional limits Presentation: Spoon   Solid     Solid: Impaired Presentation: Self Fed Oral Phase Functional Implications: Prolonged oral transit;Impaired mastication     Colin Mulders., M.S., OEV-OJJ Acute Rehabilitation Services Office: [phone]  Elvia Collum Clarice Zulauf 07/04/2021,9:36 AM

## 2021-07-04 NOTE — Progress Notes (Signed)
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 In the ED COVID-19 was negative, WBC 19.7, he was febrile with temperature 102.3.  Code sepsis was initiated.  Patient received 30 cc/kg IV fluids per sepsis protocol.  Lactic acid was 2.0.  Blood cultures x2 obtained.    Subjective   Patient seen and examined, no new complaints.  Awaiting surgery on Tuesday.   Assessment/Plan:    Sepsis -Patient presented with sepsis with acute kidney injury -Likely source of infection is left foot cellulitis, infected wounds -Blood culture growing Streptococcus, started on ceftriaxone -We will also continue with Flagyl as patient has foul-smelling discharge from left foot   Left foot gangrene/peripheral vascular disease -Patient has severe peripheral vascular disease -He was recommended to have left BKA by vascular surgery in the past but he was refusing -Now he is agreeable for amputation -Vascular surgery has been consulted, plan for above-knee amputation on 07/07/2021 -Eliquis was changed to Lovenox  -We will hold Lovenox after the last dose on Sunday in anticipation for surgery on Tuesday  Altered mental status -Resolved; likely from above -At baseline  Acute kidney injury -Patient baseline creatinine 1.16, presented with creatinine of 1.79 -Creatinine improved today to 1.68 -He has nephrostomy tube in place; CT scan shows left nephrostomy tube with no complicating features,  resolved hydronephrosis -Avoid nephrotoxic agents -Cannot give IV fluids due to history of underlying CHF -Follow BMP in am  Chronic systolic heart failure -Patient's EF is 25 to 30% -We will avoid giving IV fluids  Diabetes mellitus type 2 -CBG well controlled -Continue sliding scale insulin with NovoLog  Pulmonary embolism 04/2021 -Continue Lovenox full dose per pharmacy  Nephrostomy status -Patient has nephrostomy tube replaced on 06/07/2021 after he pulled it out -CT renal stone study shows nephrostomy tube without complications  Hypotension -Patient is on midodrine 10 mg p.o. 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  -Amiodarone 200 mg p.o. daily  -Eliquis has been changed to Lovenox full dose as above  Scheduled medications:    amiodarone  200 mg Oral Daily   enoxaparin (LOVENOX) injection  80 mg Subcutaneous Q12H   insulin aspart  0-9 Units Subcutaneous Q6H   midodrine  10 mg Oral TID WC     Data Reviewed:   CBG:  Recent Labs  Lab 07/03/21 1640 07/04/21 0042 07/04/21 0502 07/04/21 0802 07/04/21 1208  GLUCAP 104* 88 86 77 106*    SpO2: 99 %    Vitals:   07/03/21 2023 07/04/21 0457 07/04/21 0849 07/04/21 1209  BP: (!) 126/57 138/67 128/72 139/67  Pulse: 73 75 76 78  Resp: '18  18 18  '$ Temp: 98.7 F (37.1 C) 98.1 F (36.7 C) 97.9 F (36.6 C) 98.2 F (36.8 C)  TempSrc: Oral Oral    SpO2: 99% 97% 98% 99%  Weight:  60.6 kg       Intake/Output Summary (Last 24 hours) at 07/04/2021 1551 Last data filed at 07/04/2021 1008 Gross per 24 hour  Intake --  Output 575 ml  Net -575 ml    10/13 1901 - 10/15 0700 In: 740 [P.O.:240] Out: 975 [Urine:975]  Filed Weights   07/03/21 1200 07/04/21 0457  Weight: 78 kg 60.6 kg    Data Reviewed: Basic Metabolic Panel: Recent Labs  Lab 07/01/21 1625 07/01/21 2034 07/02/21 0219  NA 140 140 139  K 4.2 4.4 3.8  CL 107 108 109  CO2 '23 24 23  '$ GLUCOSE 114*  113* 98  BUN 35* 33* 34*  CREATININE 1.79* 1.60* 1.68*  CALCIUM 8.6* 8.5* 8.5*  MG  --   --  1.8   Liver Function Tests: Recent Labs  Lab 07/01/21 1625 07/01/21 2034 07/02/21 0219  AST 26 31 40  ALT '11 13 15  '$ ALKPHOS 96 91 84  BILITOT 0.9 1.1 1.5*  PROT 5.9* 6.3* 5.8*  ALBUMIN 2.0* 2.1* 1.9*   No results for input(s): LIPASE, AMYLASE in the last 168 hours. No results for input(s): AMMONIA in the last 168 hours. CBC: Recent Labs  Lab 07/01/21 1508 07/02/21 0219  WBC 19.7* 17.4*  NEUTROABS 18.3* 16.4*  HGB 10.6* 8.3*  HCT 35.5* 27.1*  MCV 73.2* 71.7*  PLT 236 176   Cardiac Enzymes: No results for input(s): CKTOTAL, CKMB, CKMBINDEX, TROPONINI in the last 168 hours. BNP (last 3 results) Recent Labs    04/28/21 0116 04/29/21 0826 05/02/21 2338  BNP 1,706.1* 2,208.9* 1,209.2*    ProBNP (last 3 results) Recent Labs    08/25/20 1121 03/27/21 1123  PROBNP 21,395* 3,146*    CBG: Recent Labs  Lab 07/03/21 1640 07/04/21 0042 07/04/21 0502 07/04/21 0802 07/04/21 1208  GLUCAP 104* 88 86 77 106*       Radiology Reports  No results found.     Antibiotics: Anti-infectives (From admission, onward)    Start     Dose/Rate Route Frequency Ordered Stop   07/02/21 1900  vancomycin (VANCOCIN) IVPB 1000 mg/200 mL premix  Status:  Discontinued        1,000 mg 200 mL/hr over 60 Minutes Intravenous Every 24 hours 07/01/21 1834 07/02/21 0656   07/02/21 1000  ceFEPIme (MAXIPIME) 2 g in sodium chloride 0.9 % 100 mL IVPB  Status:  Discontinued        2 g 200 mL/hr over 30 Minutes Intravenous Every 12 hours 07/01/21 1834 07/02/21 0656   07/02/21 0800  metroNIDAZOLE (FLAGYL) IVPB 500 mg        500 mg 100 mL/hr over 60 Minutes Intravenous Every 12 hours 07/01/21 2342     07/02/21 0800  cefTRIAXone (ROCEPHIN) 2 g in sodium chloride 0.9 % 100 mL IVPB        2 g 200 mL/hr over 30 Minutes Intravenous Every 24 hours 07/02/21 0656     07/01/21 1545  ceFEPIme (MAXIPIME)  2 g in sodium chloride 0.9 % 100 mL IVPB        2 g 200 mL/hr over 30 Minutes Intravenous  Once 07/01/21 1537 07/01/21 1756   07/01/21 1545  vancomycin (VANCOREADY) IVPB 1500 mg/300 mL        1,500 mg 150 mL/hr over 120 Minutes Intravenous  Once 07/01/21 1538 07/01/21 2000   07/01/21 1515  metroNIDAZOLE (FLAGYL) IVPB 500 mg        500 mg 100 mL/hr over 60 Minutes Intravenous  Once 07/01/21 1505 07/01/21 1641         DVT prophylaxis: Apixaban  Code Status: Full code  Family Communication: No family at bedside  Consultants:   Procedures:     Objective    Physical Examination:  General-appears in no acute distress Heart-S1-S2, regular, no murmur auscultated Lungs-clear to auscultation bilaterally, no wheezing or crackles auscultated Abdomen-soft, nontender, no organomegaly Extremities-left foot in dressing  Neuro-alert, oriented x3, no focal deficit noted   Status is: Inpatient  Dispo: The patient is from: Home              Anticipated d/c is to: Skilled nursing facility              Anticipated d/c date is: 07/10/2021              Patient currently not stable for discharge  Barrier to discharge-ongoing valuation for sepsis, awaiting amputation of left lower extremity  COVID-19 Labs  No results for input(s): DDIMER, FERRITIN, LDH, CRP in the last 72 hours.  Lab Results  Component Value Date   SARSCOV2NAA NEGATIVE 07/01/2021   SARSCOV2NAA NEGATIVE 06/06/2021   SARSCOV2NAA POSITIVE (A) 05/21/2021   SARSCOV2NAA POSITIVE (A) 04/28/2021     Pressure Injury 04/21/21 Coccyx Medial Stage 2 -  Partial thickness loss of dermis presenting as a shallow open injury with a red, pink wound bed without slough. (Active)  04/21/21 1255  Location: Coccyx  Location Orientation: Medial  Staging: Stage 2 -  Partial thickness loss of dermis presenting as a shallow open injury with a red, pink wound bed without slough.  Wound Description (Comments):   Present on  Admission: No        Recent Results (from the past 240 hour(s))  Urine Culture     Status: Abnormal (Preliminary result)   Collection Time: 07/01/21  3:05 PM   Specimen: Urine, Catheterized  Result Value Ref Range Status   Specimen Description URINE, CATHETERIZED  Final   Special Requests NONE  Final   Culture (A)  Final    >=100,000 COLONIES/mL GRAM NEGATIVE RODS SUSCEPTIBILITIES TO FOLLOW Performed at [facility], [address] [place], [place] [address]    Report Status PENDING  Incomplete  Blood Culture (routine x 2)     Status: Abnormal   Collection Time: 07/01/21  3:08 PM   Specimen: BLOOD  Result Value Ref Range Status   Specimen Description BLOOD SITE NOT SPECIFIED  Final   Special Requests   Final    BOTTLES DRAWN AEROBIC AND ANAEROBIC Blood Culture adequate volume   Culture  Setup Time   Final    GRAM POSITIVE COCCI IN CHAINS IN BOTH AEROBIC AND ANAEROBIC BOTTLES CRITICAL RESULT CALLED TO, READ BACK BY AND VERIFIED WITH: PHARMD C.AMEND AT [place] 07/02/2021 BY [place]. Performed at [facility], [place] [address] [place], [place] [address]    Culture STREPTOCOCCUS DYSGALACTIAE (A)  Final   Report Status 07/04/2021 FINAL  Final   Organism ID, Bacteria STREPTOCOCCUS DYSGALACTIAE  Final      Susceptibility   Streptococcus dysgalactiae - MIC*    CLINDAMYCIN <=0.25 SENSITIVE Sensitive     AMPICILLIN <=0.25 SENSITIVE Sensitive     ERYTHROMYCIN <=0.12 SENSITIVE Sensitive     VANCOMYCIN 0.5 SENSITIVE Sensitive     CEFTRIAXONE <=0.12 SENSITIVE Sensitive     LEVOFLOXACIN 0.5 SENSITIVE Sensitive     PENICILLIN Value in next row Sensitive      SENSITIVE<=0.06    * STREPTOCOCCUS DYSGALACTIAE  Blood Culture ID Panel (Reflexed)     Status: Abnormal   Collection Time: 07/01/21  3:08 PM  Result Value Ref Range Status  Enterococcus faecalis NOT DETECTED NOT DETECTED Final   Enterococcus Faecium NOT DETECTED NOT DETECTED Final   Listeria monocytogenes NOT DETECTED  NOT DETECTED Final   Staphylococcus species NOT DETECTED NOT DETECTED Final   Staphylococcus aureus (BCID) NOT DETECTED NOT DETECTED Final   Staphylococcus epidermidis NOT DETECTED NOT DETECTED Final   Staphylococcus lugdunensis NOT DETECTED NOT DETECTED Final   Streptococcus species DETECTED (A) NOT DETECTED Final    Comment: Not Enterococcus species, Streptococcus agalactiae, Streptococcus pyogenes, or Streptococcus pneumoniae. CRITICAL RESULT CALLED TO, READ BACK BY AND VERIFIED WITH: C AMEND,PHARMD'@0645'$  06/29/21 [place]    Streptococcus agalactiae NOT DETECTED NOT DETECTED Final   Streptococcus pneumoniae NOT DETECTED NOT DETECTED Final   Streptococcus pyogenes NOT DETECTED NOT DETECTED Final   A.calcoaceticus-baumannii NOT DETECTED NOT DETECTED Final   Bacteroides fragilis NOT DETECTED NOT DETECTED Final   Enterobacterales NOT DETECTED NOT DETECTED Final   Enterobacter cloacae complex NOT DETECTED NOT DETECTED Final   Escherichia coli NOT DETECTED NOT DETECTED Final   Klebsiella aerogenes NOT DETECTED NOT DETECTED Final   Klebsiella oxytoca NOT DETECTED NOT DETECTED Final   Klebsiella pneumoniae NOT DETECTED NOT DETECTED Final   Proteus species NOT DETECTED NOT DETECTED Final   Salmonella species NOT DETECTED NOT DETECTED Final   Serratia marcescens NOT DETECTED NOT DETECTED Final   Haemophilus influenzae NOT DETECTED NOT DETECTED Final   Neisseria meningitidis NOT DETECTED NOT DETECTED Final   Pseudomonas aeruginosa NOT DETECTED NOT DETECTED Final   Stenotrophomonas maltophilia NOT DETECTED NOT DETECTED Final   Candida albicans NOT DETECTED NOT DETECTED Final   Candida auris NOT DETECTED NOT DETECTED Final   Candida glabrata NOT DETECTED NOT DETECTED Final   Candida krusei NOT DETECTED NOT DETECTED Final   Candida parapsilosis NOT DETECTED NOT DETECTED Final   Candida tropicalis NOT DETECTED NOT DETECTED Final   Cryptococcus neoformans/gattii NOT DETECTED NOT DETECTED Final     Comment: Performed at [facility], [address] [place], [place] [address]  Resp Panel by RT-PCR (Flu A&B, Covid) Nasopharyngeal Swab     Status: None   Collection Time: 07/01/21  3:15 PM   Specimen: Nasopharyngeal Swab; Nasopharyngeal(NP) swabs in vial transport medium  Result Value Ref Range Status   SARS Coronavirus 2 by RT PCR NEGATIVE NEGATIVE Final    Comment: (NOTE) SARS-CoV-2 target nucleic acids are NOT DETECTED.  The SARS-CoV-2 RNA is generally detectable in upper respiratory specimens during the acute phase of infection. The lowest concentration of SARS-CoV-2 viral copies this assay can detect is 138 copies/mL. A negative result does not preclude SARS-Cov-2 infection and should not be used as the sole basis for treatment or other patient management decisions. A negative result may occur with  improper specimen collection/handling, submission of specimen other than nasopharyngeal swab, presence of viral mutation(s) within the areas targeted by this assay, and inadequate number of viral copies(<138 copies/mL). A negative result must be combined with clinical observations, patient history, and epidemiological information. The expected result is Negative.  Fact Sheet for Patients:  EntrepreneurPulse.com.au  Fact Sheet for Healthcare Providers:  IncredibleEmployment.be  This test is no t yet approved or cleared by the [place] FDA and  has been authorized for detection and/or diagnosis of SARS-CoV-2 by FDA under an Emergency Use Authorization (EUA). This EUA will remain  in effect (meaning this test can be used) for the duration of the COVID-19 declaration under Section 564(b)(1) of the Act, 21 U.S.C.section 360bbb-3(b)(1), unless the authorization is terminated  or revoked sooner.       Influenza A by PCR NEGATIVE NEGATIVE Final   Influenza B by PCR NEGATIVE NEGATIVE Final    Comment: (NOTE) The Xpert Xpress  SARS-CoV-2/FLU/RSV plus assay is intended as an aid in the diagnosis of influenza from Nasopharyngeal swab specimens and should not be used as a sole basis for treatment. Nasal washings and aspirates are unacceptable for Xpert Xpress SARS-CoV-2/FLU/RSV testing.  Fact Sheet for Patients: EntrepreneurPulse.com.au  Fact Sheet for Healthcare Providers: IncredibleEmployment.be  This test is not yet approved or cleared by the [place] FDA and has been authorized for detection and/or diagnosis of SARS-CoV-2 by FDA under an Emergency Use Authorization (EUA). This EUA will remain in effect (meaning this test can be used) for the duration of the COVID-19 declaration under Section 564(b)(1) of the Act, 21 U.S.C. section 360bbb-3(b)(1), unless the authorization is terminated or revoked.  Performed at [facility], [place] [address] [place], [place] [address]   Blood Culture (routine x 2)     Status: Abnormal   Collection Time: 07/01/21  3:40 PM   Specimen: BLOOD LEFT ARM  Result Value Ref Range Status   Specimen Description BLOOD LEFT ARM  Final   Special Requests   Final    BOTTLES DRAWN AEROBIC AND ANAEROBIC Blood Culture adequate volume   Culture  Setup Time   Final    GRAM POSITIVE COCCI IN CHAINS IN BOTH AEROBIC AND ANAEROBIC BOTTLES CRITICAL VALUE NOTED.  VALUE IS CONSISTENT WITH PREVIOUSLY REPORTED AND CALLED VALUE.    Culture (A)  Final    STREPTOCOCCUS DYSGALACTIAE SUSCEPTIBILITIES PERFORMED ON PREVIOUS CULTURE WITHIN THE LAST 5 DAYS. Performed at [facility], [place] [address] [place], [place] [address]    Report Status 07/04/2021 FINAL  Final    Oswald Hillock   [facility] If 7PM-7AM, please contact night-coverage at [email or web address], Office  [phone]   07/04/2021, 3:51 PM  LOS: 3 days

## 2021-07-05 DIAGNOSIS — I5022 Chronic systolic (congestive) heart failure: Secondary | ICD-10-CM | POA: Diagnosis not present

## 2021-07-05 DIAGNOSIS — I10 Essential (primary) hypertension: Secondary | ICD-10-CM | POA: Diagnosis not present

## 2021-07-05 DIAGNOSIS — I96 Gangrene, not elsewhere classified: Secondary | ICD-10-CM | POA: Diagnosis not present

## 2021-07-05 DIAGNOSIS — N179 Acute kidney failure, unspecified: Secondary | ICD-10-CM | POA: Diagnosis not present

## 2021-07-05 LAB — GLUCOSE, CAPILLARY
Glucose-Capillary: 91 mg/dL (ref 70–99)
Glucose-Capillary: 85 mg/dL (ref 70–99)
Glucose-Capillary: 88 mg/dL (ref 70–99)
Glucose-Capillary: 90 mg/dL (ref 70–99)

## 2021-07-05 NOTE — Plan of Care (Signed)

## 2021-07-05 NOTE — Progress Notes (Signed)
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 In the ED COVID-19 was negative, WBC 19.7, he was febrile with temperature 102.3.  Code sepsis was initiated.  Patient received 30 cc/kg IV fluids per sepsis protocol.  Lactic acid was 2.0.  Blood cultures x2 obtained.    Subjective   Patient seen and examined, no new complaints.  Awaiting surgery on Tuesday.   Assessment/Plan:    Sepsis/Streptococcus bacteremia -Patient presented with sepsis with acute kidney injury -Likely source of infection is left foot cellulitis, infected wounds -Blood culture growing Streptococcus dysagalactiae started on ceftriaxone -We will also continue with Flagyl as patient has foul-smelling discharge from left foot   Left foot gangrene/peripheral vascular disease -Patient has severe peripheral vascular disease -He was recommended to have left BKA by vascular surgery in the past but he was refusing -Now he is agreeable for amputation -Vascular surgery has been consulted, plan for above-knee amputation on 07/07/2021 -Eliquis was changed to Lovenox  -We will hold Lovenox after the last dose on Sunday in anticipation for surgery on Tuesday  Altered mental status -Resolved; likely from above -At baseline  Acute kidney injury -Patient baseline creatinine 1.16, presented with creatinine of 1.79 -Creatinine improved today to 1.68 -He has nephrostomy tube in place; CT scan shows left nephrostomy  tube with no complicating features, resolved hydronephrosis -Avoid nephrotoxic agents -Cannot give IV fluids due to history of underlying CHF -Follow BMP in am  Chronic systolic heart failure -Patient's EF is 25 to 30% -We will avoid giving IV fluids  Diabetes mellitus type 2 -CBG well controlled -Continue sliding scale insulin with NovoLog  Pulmonary embolism 04/2021 -Continue Lovenox full dose per pharmacy  Nephrostomy status -Patient has nephrostomy tube replaced on 06/07/2021 after he pulled it out -CT renal stone study shows nephrostomy tube without complications  Hypotension -Patient is on midodrine 10 mg p.o. 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  -Amiodarone 200 mg p.o. daily  -Eliquis has been changed to Lovenox full dose as above  Scheduled medications:    amiodarone  200 mg Oral Daily   enoxaparin (LOVENOX) injection  80 mg Subcutaneous Q12H   insulin aspart  0-9 Units Subcutaneous Q6H   midodrine  10 mg Oral TID WC     Data Reviewed:   CBG:  Recent Labs  Lab 07/04/21 1208 07/04/21 1627 07/04/21 2228 07/05/21 0540 07/05/21 1230  GLUCAP 106* 88 92 91 85    SpO2: 96 %    Vitals:   07/04/21 1209 07/04/21 1630 07/04/21 2116 07/05/21 0543  BP: 139/67 137/64 134/61 136/64  Pulse: 78 73 75 70  Resp: '18 18 18 18  '$ Temp: 98.2 F (36.8 C) 98.6 F (37 C) 99.1 F (37.3 C) 98.4 F (36.9 C)  TempSrc:      SpO2: 99% 99% 99% 96%  Weight:         Intake/Output Summary (Last 24 hours) at 07/05/2021 1424 Last data filed at 07/05/2021 0400 Gross per 24 hour  Intake 420.8  ml  Output 420 ml  Net 0.8 ml    10/14 1901 - 10/16 0700 In: 420.8 [P.O.:120] Out: 995 [Urine:995]  Filed Weights   07/03/21 1200 07/04/21 0457  Weight: 78 kg 60.6 kg    Data Reviewed: Basic Metabolic Panel: Recent Labs  Lab 07/01/21 1625 07/01/21 2034 07/02/21 0219  NA 140 140 139  K 4.2 4.4 3.8  CL 107 108 109  CO2 '23  24 23  '$ GLUCOSE 114* 113* 98  BUN 35* 33* 34*  CREATININE 1.79* 1.60* 1.68*  CALCIUM 8.6* 8.5* 8.5*  MG  --   --  1.8   Liver Function Tests: Recent Labs  Lab 07/01/21 1625 07/01/21 2034 07/02/21 0219  AST 26 31 40  ALT '11 13 15  '$ ALKPHOS 96 91 84  BILITOT 0.9 1.1 1.5*  PROT 5.9* 6.3* 5.8*  ALBUMIN 2.0* 2.1* 1.9*   No results for input(s): LIPASE, AMYLASE in the last 168 hours. No results for input(s): AMMONIA in the last 168 hours. CBC: Recent Labs  Lab 07/01/21 1508 07/02/21 0219  WBC 19.7* 17.4*  NEUTROABS 18.3* 16.4*  HGB 10.6* 8.3*  HCT 35.5* 27.1*  MCV 73.2* 71.7*  PLT 236 176   Cardiac Enzymes: No results for input(s): CKTOTAL, CKMB, CKMBINDEX, TROPONINI in the last 168 hours. BNP (last 3 results) Recent Labs    04/28/21 0116 04/29/21 0826 05/02/21 2338  BNP 1,706.1* 2,208.9* 1,209.2*    ProBNP (last 3 results) Recent Labs    08/25/20 1121 03/27/21 1123  PROBNP 21,395* 3,146*    CBG: Recent Labs  Lab 07/04/21 1208 07/04/21 1627 07/04/21 2228 07/05/21 0540 07/05/21 1230  GLUCAP 106* 88 92 91 85       Radiology Reports  No results found.     Antibiotics: Anti-infectives (From admission, onward)    Start     Dose/Rate Route Frequency Ordered Stop   07/02/21 1900  vancomycin (VANCOCIN) IVPB 1000 mg/200 mL premix  Status:  Discontinued        1,000 mg 200 mL/hr over 60 Minutes Intravenous Every 24 hours 07/01/21 1834 07/02/21 0656   07/02/21 1000  ceFEPIme (MAXIPIME) 2 g in sodium chloride 0.9 % 100 mL IVPB  Status:  Discontinued        2 g 200 mL/hr over 30 Minutes Intravenous Every 12 hours 07/01/21 1834 07/02/21 0656   07/02/21 0800  metroNIDAZOLE (FLAGYL) IVPB 500 mg        500 mg 100 mL/hr over 60 Minutes Intravenous Every 12 hours 07/01/21 2342     07/02/21 0800  cefTRIAXone (ROCEPHIN) 2 g in sodium chloride 0.9 % 100 mL IVPB        2 g 200 mL/hr over 30 Minutes Intravenous Every 24 hours 07/02/21 0656     07/01/21 1545   ceFEPIme (MAXIPIME) 2 g in sodium chloride 0.9 % 100 mL IVPB        2 g 200 mL/hr over 30 Minutes Intravenous  Once 07/01/21 1537 07/01/21 1756   07/01/21 1545  vancomycin (VANCOREADY) IVPB 1500 mg/300 mL        1,500 mg 150 mL/hr over 120 Minutes Intravenous  Once 07/01/21 1538 07/01/21 2000   07/01/21 1515  metroNIDAZOLE (FLAGYL) IVPB 500 mg        500 mg 100 mL/hr over 60 Minutes Intravenous  Once 07/01/21 1505 07/01/21 1641         DVT prophylaxis: Apixaban  Code Status: Full code  Family Communication: No family at bedside  Consultants:   Procedures:     Objective    Physical Examination:  General-appears in no acute distress Heart-S1-S2, regular, no murmur auscultated Lungs-clear to auscultation bilaterally, no wheezing or crackles auscultated Abdomen-soft, nontender, no organomegaly Extremities-left foot in dressing  Neuro-alert, oriented x3, no focal deficit noted   Status is: Inpatient  Dispo: The patient is from: Home              Anticipated d/c is to: Skilled nursing facility              Anticipated d/c date is: 07/10/2021              Patient currently not stable for discharge  Barrier to discharge-ongoing valuation for sepsis, awaiting amputation of left lower extremity  COVID-19 Labs  No results for input(s): DDIMER, FERRITIN, LDH, CRP in the last 72 hours.  Lab Results  Component Value Date   SARSCOV2NAA NEGATIVE 07/01/2021   SARSCOV2NAA NEGATIVE 06/06/2021   SARSCOV2NAA POSITIVE (A) 05/21/2021   SARSCOV2NAA POSITIVE (A) 04/28/2021     Pressure Injury 04/21/21 Coccyx Medial Stage 2 -  Partial thickness loss of dermis presenting as a shallow open injury with a red, pink wound bed without slough. (Active)  04/21/21 1255  Location: Coccyx  Location Orientation: Medial  Staging: Stage 2 -  Partial thickness loss of dermis presenting as a shallow open injury with a red, pink wound bed without slough.  Wound Description (Comments):    Present on Admission: No        Recent Results (from the past 240 hour(s))  Urine Culture     Status: Abnormal (Preliminary result)   Collection Time: 07/01/21  3:05 PM   Specimen: Urine, Catheterized  Result Value Ref Range Status   Specimen Description URINE, CATHETERIZED  Final   Special Requests NONE  Final   Culture (A)  Final    >=100,000 COLONIES/mL GRAM NEGATIVE RODS IDENTIFICATION AND SUSCEPTIBILITIES TO FOLLOW Performed at [facility], [address] [place], [place] [address]    Report Status PENDING  Incomplete  Blood Culture (routine x 2)     Status: Abnormal   Collection Time: 07/01/21  3:08 PM   Specimen: BLOOD  Result Value Ref Range Status   Specimen Description BLOOD SITE NOT SPECIFIED  Final   Special Requests   Final    BOTTLES DRAWN AEROBIC AND ANAEROBIC Blood Culture adequate volume   Culture  Setup Time   Final    GRAM POSITIVE COCCI IN CHAINS IN BOTH AEROBIC AND ANAEROBIC BOTTLES CRITICAL RESULT CALLED TO, READ BACK BY AND VERIFIED WITH: PHARMD C.AMEND AT [place] 07/02/2021 BY [place]. Performed at [facility], [place] [address], [place], [place] [address]    Culture STREPTOCOCCUS DYSGALACTIAE (A)  Final   Report Status 07/04/2021 FINAL  Final   Organism ID, Bacteria STREPTOCOCCUS DYSGALACTIAE  Final      Susceptibility   Streptococcus dysgalactiae - MIC*    CLINDAMYCIN <=0.25 SENSITIVE Sensitive     AMPICILLIN <=0.25 SENSITIVE Sensitive     ERYTHROMYCIN <=0.12 SENSITIVE Sensitive     VANCOMYCIN 0.5 SENSITIVE Sensitive     CEFTRIAXONE <=0.12 SENSITIVE Sensitive     LEVOFLOXACIN 0.5 SENSITIVE Sensitive     PENICILLIN Value in next row Sensitive      SENSITIVE<=0.06    * STREPTOCOCCUS DYSGALACTIAE  Blood Culture ID Panel (Reflexed)     Status: Abnormal   Collection Time: 07/01/21  3:08 PM  Result Value Ref  Range Status   Enterococcus faecalis NOT DETECTED NOT DETECTED Final   Enterococcus Faecium NOT DETECTED NOT DETECTED Final    Listeria monocytogenes NOT DETECTED NOT DETECTED Final   Staphylococcus species NOT DETECTED NOT DETECTED Final   Staphylococcus aureus (BCID) NOT DETECTED NOT DETECTED Final   Staphylococcus epidermidis NOT DETECTED NOT DETECTED Final   Staphylococcus lugdunensis NOT DETECTED NOT DETECTED Final   Streptococcus species DETECTED (A) NOT DETECTED Final    Comment: Not Enterococcus species, Streptococcus agalactiae, Streptococcus pyogenes, or Streptococcus pneumoniae. CRITICAL RESULT CALLED TO, READ BACK BY AND VERIFIED WITH: C AMEND,PHARMD'@0645'$  06/29/21 [place]    Streptococcus agalactiae NOT DETECTED NOT DETECTED Final   Streptococcus pneumoniae NOT DETECTED NOT DETECTED Final   Streptococcus pyogenes NOT DETECTED NOT DETECTED Final   A.calcoaceticus-baumannii NOT DETECTED NOT DETECTED Final   Bacteroides fragilis NOT DETECTED NOT DETECTED Final   Enterobacterales NOT DETECTED NOT DETECTED Final   Enterobacter cloacae complex NOT DETECTED NOT DETECTED Final   Escherichia coli NOT DETECTED NOT DETECTED Final   Klebsiella aerogenes NOT DETECTED NOT DETECTED Final   Klebsiella oxytoca NOT DETECTED NOT DETECTED Final   Klebsiella pneumoniae NOT DETECTED NOT DETECTED Final   Proteus species NOT DETECTED NOT DETECTED Final   Salmonella species NOT DETECTED NOT DETECTED Final   Serratia marcescens NOT DETECTED NOT DETECTED Final   Haemophilus influenzae NOT DETECTED NOT DETECTED Final   Neisseria meningitidis NOT DETECTED NOT DETECTED Final   Pseudomonas aeruginosa NOT DETECTED NOT DETECTED Final   Stenotrophomonas maltophilia NOT DETECTED NOT DETECTED Final   Candida albicans NOT DETECTED NOT DETECTED Final   Candida auris NOT DETECTED NOT DETECTED Final   Candida glabrata NOT DETECTED NOT DETECTED Final   Candida krusei NOT DETECTED NOT DETECTED Final   Candida parapsilosis NOT DETECTED NOT DETECTED Final   Candida tropicalis NOT DETECTED NOT DETECTED Final   Cryptococcus neoformans/gattii NOT  DETECTED NOT DETECTED Final    Comment: Performed at [facility], [address] [place], [place] [address]  Resp Panel by RT-PCR (Flu A&B, Covid) Nasopharyngeal Swab     Status: None   Collection Time: 07/01/21  3:15 PM   Specimen: Nasopharyngeal Swab; Nasopharyngeal(NP) swabs in vial transport medium  Result Value Ref Range Status   SARS Coronavirus 2 by RT PCR NEGATIVE NEGATIVE Final    Comment: (NOTE) SARS-CoV-2 target nucleic acids are NOT DETECTED.  The SARS-CoV-2 RNA is generally detectable in upper respiratory specimens during the acute phase of infection. The lowest concentration of SARS-CoV-2 viral copies this assay can detect is 138 copies/mL. A negative result does not preclude SARS-Cov-2 infection and should not be used as the sole basis for treatment or other patient management decisions. A negative result may occur with  improper specimen collection/handling, submission of specimen other than nasopharyngeal swab, presence of viral mutation(s) within the areas targeted by this assay, and inadequate number of viral copies(<138 copies/mL). A negative result must be combined with clinical observations, patient history, and epidemiological information. The expected result is Negative.  Fact Sheet for Patients:  EntrepreneurPulse.com.au  Fact Sheet for Healthcare Providers:  IncredibleEmployment.be  This test is no t yet approved or cleared by the [place] FDA and  has been authorized for detection and/or diagnosis of SARS-CoV-2 by FDA under an Emergency Use Authorization (EUA). This EUA will remain  in effect (meaning this test can be used) for the duration of the COVID-19 declaration under Section 564(b)(1) of the Act, 21 U.S.C.section 360bbb-3(b)(1), unless the  authorization is terminated  or revoked sooner.       Influenza A by PCR NEGATIVE NEGATIVE Final   Influenza B by PCR NEGATIVE NEGATIVE Final    Comment:  (NOTE) The Xpert Xpress SARS-CoV-2/FLU/RSV plus assay is intended as an aid in the diagnosis of influenza from Nasopharyngeal swab specimens and should not be used as a sole basis for treatment. Nasal washings and aspirates are unacceptable for Xpert Xpress SARS-CoV-2/FLU/RSV testing.  Fact Sheet for Patients: EntrepreneurPulse.com.au  Fact Sheet for Healthcare Providers: IncredibleEmployment.be  This test is not yet approved or cleared by the [place] FDA and has been authorized for detection and/or diagnosis of SARS-CoV-2 by FDA under an Emergency Use Authorization (EUA). This EUA will remain in effect (meaning this test can be used) for the duration of the COVID-19 declaration under Section 564(b)(1) of the Act, 21 U.S.C. section 360bbb-3(b)(1), unless the authorization is terminated or revoked.  Performed at [facility], [place] [address], [place], [place] [address]   Blood Culture (routine x 2)     Status: Abnormal   Collection Time: 07/01/21  3:40 PM   Specimen: BLOOD LEFT ARM  Result Value Ref Range Status   Specimen Description BLOOD LEFT ARM  Final   Special Requests   Final    BOTTLES DRAWN AEROBIC AND ANAEROBIC Blood Culture adequate volume   Culture  Setup Time   Final    GRAM POSITIVE COCCI IN CHAINS IN BOTH AEROBIC AND ANAEROBIC BOTTLES CRITICAL VALUE NOTED.  VALUE IS CONSISTENT WITH PREVIOUSLY REPORTED AND CALLED VALUE.    Culture (A)  Final    STREPTOCOCCUS DYSGALACTIAE SUSCEPTIBILITIES PERFORMED ON PREVIOUS CULTURE WITHIN THE LAST 5 DAYS. Performed at [facility], [place] [address], [place], [place] [address]    Report Status 07/04/2021 FINAL  Final    Oswald Hillock   [facility] If 7PM-7AM, please contact night-coverage at [email or web address], Office  [phone]   07/05/2021, 2:24 PM  LOS: 4 days

## 2021-07-06 DIAGNOSIS — I96 Gangrene, not elsewhere classified: Secondary | ICD-10-CM | POA: Diagnosis not present

## 2021-07-06 DIAGNOSIS — N179 Acute kidney failure, unspecified: Secondary | ICD-10-CM | POA: Diagnosis not present

## 2021-07-06 DIAGNOSIS — I10 Essential (primary) hypertension: Secondary | ICD-10-CM | POA: Diagnosis not present

## 2021-07-06 DIAGNOSIS — I5022 Chronic systolic (congestive) heart failure: Secondary | ICD-10-CM | POA: Diagnosis not present

## 2021-07-06 LAB — URINE CULTURE: Culture: >=100000 — AB

## 2021-07-06 LAB — GLUCOSE, CAPILLARY
Glucose-Capillary: 80 mg/dL (ref 70–99)
Glucose-Capillary: 79 mg/dL (ref 70–99)
Glucose-Capillary: 93 mg/dL (ref 70–99)
Glucose-Capillary: 76 mg/dL (ref 70–99)

## 2021-07-06 MED ORDER — OXYCODONE HCL 5 MG PO TABS
5.0000 mg | ORAL_TABLET | Freq: Four times a day (QID) | ORAL | Status: DC | PRN
  Administered 2021-07-06 – 2021-07-13 (×11): 5 mg via ORAL
  Filled 2021-07-06 (×12): qty 1

## 2021-07-06 MED ORDER — SODIUM CHLORIDE 0.9 % IV SOLN
1.5000 g | INTRAVENOUS | Status: AC
  Administered 2021-07-07: 1.5 g via INTRAVENOUS
  Filled 2021-07-06: qty 1.5

## 2021-07-06 NOTE — Progress Notes (Signed)
    Left LE ischemia NPO past MN Left above knee amputation scheduled for 07/07/21    Roxy Horseman PA-C

## 2021-07-06 NOTE — Plan of Care (Signed)

## 2021-07-06 NOTE — Progress Notes (Signed)
Patient seen and examined around noon today. Doing well, no complaints.  States pain control has been better with changes in medications.   No, cellulitis appreciated  No edema in the left leg   I had a long discussion with the patient and his family regarding need for above knee amputation . After discussing the risks and benefits, [place] elected to proceed.   Please make NPO at midnight. Basic labs in  the morning.  OR tomorrow   Macie Burows MD

## 2021-07-06 NOTE — Progress Notes (Signed)
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 In the ED COVID-19 was negative, WBC 19.7, he was febrile with temperature 102.3.  Code sepsis was initiated.  Patient received 30 cc/kg IV fluids per sepsis protocol.  Lactic acid was 2.0.  Blood cultures x2 obtained.    Subjective   Patient seen and examined, no new complaints.  Plan for left above-knee potation tomorrow morning.  Anticoagulation is currently on hold.   Assessment/Plan:    Sepsis/Streptococcus bacteremia -Patient presented with sepsis with acute kidney injury -Likely source of infection is left foot cellulitis, infected wounds -Blood culture growing Streptococcus dysagalactiae started on ceftriaxone -We will also continue with Flagyl as patient has foul-smelling discharge from left foot   Left foot gangrene/peripheral vascular disease -Patient has severe peripheral vascular disease -He was recommended to have left BKA by vascular surgery in the past but he was refusing -Now he is agreeable for amputation -Vascular surgery has been consulted, plan for above-knee amputation on 07/07/2021 -Eliquis was changed to full dose Lovenox  -Lovenox is currently on hold  Altered mental status -Resolved; likely from above -At baseline  Acute kidney injury -Patient baseline creatinine 1.16, presented with creatinine of 1.79 -Creatinine improved today to 1.68 -He has nephrostomy tube in place; CT scan shows left  nephrostomy tube with no complicating features, resolved hydronephrosis -Avoid nephrotoxic agents -Cannot give IV fluids due to history of underlying CHF -Follow BMP in am  Chronic systolic heart failure -Patient's EF is 25 to 30% -We will avoid giving IV fluids  Diabetes mellitus type 2 -CBG well controlled -Continue sliding scale insulin with NovoLog  Pulmonary embolism 04/2021 -Continue Lovenox full dose per pharmacy  Nephrostomy status -Patient has nephrostomy tube replaced on 06/07/2021 after he pulled it out -CT renal stone study shows nephrostomy tube without complications  Hypotension -Patient is on midodrine 10 mg p.o. 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  -Amiodarone 200 mg p.o. daily  -Eliquis has been changed to Lovenox full dose as above -Anticoagulation on hold for anticipation for surgery in a.m.  Scheduled medications:    amiodarone  200 mg Oral Daily   insulin aspart  0-9 Units Subcutaneous Q6H   midodrine  10 mg Oral TID WC     Data Reviewed:   CBG:  Recent Labs  Lab 07/05/21 1557 07/05/21 2122 07/06/21 0539 07/06/21 0727 07/06/21 1123  GLUCAP 88 90 80 79 93    SpO2: 96 %    Vitals:   07/05/21 1919 07/06/21 0439 07/06/21 0727 07/06/21 1623  BP:  128/62 124/70 123/62  Pulse:  80 77 78  Resp: '18 18 18 18  '$ Temp:  98 F (36.7 C) 98.1 F (36.7 C) 98.6 F (37 C)  TempSrc:  Oral    SpO2:  95% 98% 96%  Weight:         Intake/Output Summary (Last 24 hours) at 07/06/2021 1656 Last data filed at 07/06/2021 1635 Gross per 24 hour  Intake --  Output 225 ml  Net -225 ml    10/15 1901 - 10/17 0700 In: 420 [P.O.:120] Out: 625 [Urine:625]  Filed Weights   07/03/21 1200 07/04/21 0457  Weight: 78 kg 60.6 kg    Data Reviewed: Basic Metabolic Panel: Recent Labs  Lab 07/01/21 1625 07/01/21 2034 07/02/21 0219  NA 140 140 139  K 4.2 4.4 3.8  CL 107 108 109  CO2 '23 24 23  '$ GLUCOSE  114* 113* 98  BUN 35* 33* 34*  CREATININE 1.79* 1.60* 1.68*  CALCIUM 8.6* 8.5* 8.5*  MG  --   --  1.8   Liver Function Tests: Recent Labs  Lab 07/01/21 1625 07/01/21 2034 07/02/21 0219  AST 26 31 40  ALT '11 13 15  '$ ALKPHOS 96 91 84  BILITOT 0.9 1.1 1.5*  PROT 5.9* 6.3* 5.8*  ALBUMIN 2.0* 2.1* 1.9*   No results for input(s): LIPASE, AMYLASE in the last 168 hours. No results for input(s): AMMONIA in the last 168 hours. CBC: Recent Labs  Lab 07/01/21 1508 07/02/21 0219  WBC 19.7* 17.4*  NEUTROABS 18.3* 16.4*  HGB 10.6* 8.3*  HCT 35.5* 27.1*  MCV 73.2* 71.7*  PLT 236 176   Cardiac Enzymes: No results for input(s): CKTOTAL, CKMB, CKMBINDEX, TROPONINI in the last 168 hours. BNP (last 3 results) Recent Labs    04/28/21 0116 04/29/21 0826 05/02/21 2338  BNP 1,706.1* 2,208.9* 1,209.2*    ProBNP (last 3 results) Recent Labs    08/25/20 1121 03/27/21 1123  PROBNP 21,395* 3,146*    CBG: Recent Labs  Lab 07/05/21 1557 07/05/21 2122 07/06/21 0539 07/06/21 0727 07/06/21 1123  GLUCAP 88 90 80 79 93       Radiology Reports  No results found.     Antibiotics: Anti-infectives (From admission, onward)    Start     Dose/Rate Route Frequency Ordered Stop   07/07/21 1000  cefUROXime (ZINACEF) 1.5 g in sodium chloride 0.9 % 100 mL IVPB        1.5 g 200 mL/hr over 30 Minutes Intravenous To [facility] 07/06/21 0655 07/08/21 1000   07/02/21 1900  vancomycin (VANCOCIN) IVPB 1000 mg/200 mL premix  Status:  Discontinued        1,000 mg 200 mL/hr over 60 Minutes Intravenous Every 24 hours 07/01/21 1834 07/02/21 0656   07/02/21 1000  ceFEPIme (MAXIPIME) 2 g in sodium chloride 0.9 % 100 mL IVPB  Status:  Discontinued        2 g 200 mL/hr over 30 Minutes Intravenous Every 12 hours 07/01/21 1834 07/02/21 0656   07/02/21 0800  metroNIDAZOLE (FLAGYL) IVPB 500 mg        500 mg 100 mL/hr over 60 Minutes Intravenous Every 12 hours 07/01/21 2342     07/02/21 0800   cefTRIAXone (ROCEPHIN) 2 g in sodium chloride 0.9 % 100 mL IVPB        2 g 200 mL/hr over 30 Minutes Intravenous Every 24 hours 07/02/21 0656     07/01/21 1545  ceFEPIme (MAXIPIME) 2 g in sodium chloride 0.9 % 100 mL IVPB        2 g 200 mL/hr over 30 Minutes Intravenous  Once 07/01/21 1537 07/01/21 1756   07/01/21 1545  vancomycin (VANCOREADY) IVPB 1500 mg/300 mL        1,500 mg 150 mL/hr over 120 Minutes Intravenous  Once 07/01/21 1538 07/01/21 2000   07/01/21 1515  metroNIDAZOLE (FLAGYL) IVPB 500 mg        500 mg 100 mL/hr  over 60 Minutes Intravenous  Once 07/01/21 1505 07/01/21 1641         DVT prophylaxis: Apixaban  Code Status: Full code  Family Communication: No family at bedside   Consultants:   Procedures:     Objective    Physical Examination:  General-appears in no acute distress Heart-S1-S2, regular, no murmur auscultated Lungs-clear to auscultation bilaterally, no wheezing or crackles auscultated Abdomen-soft, nontender, no organomegaly Extremities-left foot in dressing Neuro-alert, oriented x3, no focal deficit noted   Status is: Inpatient  Dispo: The patient is from: Home              Anticipated d/c is to: Skilled nursing facility              Anticipated d/c date is: 07/10/2021              Patient currently not stable for discharge  Barrier to discharge-ongoing valuation for sepsis, awaiting amputation of left lower extremity  COVID-19 Labs  No results for input(s): DDIMER, FERRITIN, LDH, CRP in the last 72 hours.  Lab Results  Component Value Date   SARSCOV2NAA NEGATIVE 07/01/2021   SARSCOV2NAA NEGATIVE 06/06/2021   SARSCOV2NAA POSITIVE (A) 05/21/2021   SARSCOV2NAA POSITIVE (A) 04/28/2021     Pressure Injury 04/21/21 Coccyx Medial Stage 2 -  Partial thickness loss of dermis presenting as a shallow open injury with a red, pink wound bed without slough. (Active)  04/21/21 1255  Location: Coccyx  Location Orientation: Medial   Staging: Stage 2 -  Partial thickness loss of dermis presenting as a shallow open injury with a red, pink wound bed without slough.  Wound Description (Comments):   Present on Admission: No        Recent Results (from the past 240 hour(s))  Urine Culture     Status: Abnormal   Collection Time: 07/01/21  3:05 PM   Specimen: Urine, Catheterized  Result Value Ref Range Status   Specimen Description URINE, CATHETERIZED  Final   Special Requests   Final    NONE Performed at [facility], [address] [place], [place] [address]    Culture >=100,000 COLONIES/mL HAFNIA ALVEI (A)  Final   Report Status 07/06/2021 FINAL  Final   Organism ID, Bacteria HAFNIA ALVEI (A)  Final      Susceptibility   Hafnia alvei - MIC*    AMPICILLIN >=32 RESISTANT Resistant     CEFAZOLIN >=64 RESISTANT Resistant     CEFTRIAXONE >=64 RESISTANT Resistant     CIPROFLOXACIN <=0.25 SENSITIVE Sensitive     GENTAMICIN <=1 SENSITIVE Sensitive     IMIPENEM <=0.25 SENSITIVE Sensitive     NITROFURANTOIN 64 INTERMEDIATE Intermediate     TRIMETH/SULFA <=20 SENSITIVE Sensitive     AMPICILLIN/SULBACTAM >=32 RESISTANT Resistant     PIP/TAZO >=128 RESISTANT Resistant     * >=100,000 COLONIES/mL HAFNIA ALVEI  Blood Culture (routine x 2)     Status: Abnormal   Collection Time: 07/01/21  3:08 PM   Specimen: BLOOD  Result Value Ref Range Status   Specimen Description BLOOD SITE NOT SPECIFIED  Final   Special Requests   Final    BOTTLES DRAWN AEROBIC AND ANAEROBIC Blood Culture adequate volume   Culture  Setup Time   Final    GRAM POSITIVE COCCI IN CHAINS IN BOTH AEROBIC AND ANAEROBIC BOTTLES CRITICAL RESULT CALLED TO, READ BACK BY AND VERIFIED WITH: PHARMD C.AMEND AT [place] 07/02/2021 BY [place]. Performed at [facility],  [address], [place], [place] [address]    Culture STREPTOCOCCUS DYSGALACTIAE (A)  Final   Report Status 07/04/2021 FINAL  Final   Organism ID, Bacteria STREPTOCOCCUS DYSGALACTIAE   Final      Susceptibility   Streptococcus dysgalactiae - MIC*    CLINDAMYCIN <=0.25 SENSITIVE Sensitive     AMPICILLIN <=0.25 SENSITIVE Sensitive     ERYTHROMYCIN <=0.12 SENSITIVE Sensitive     VANCOMYCIN 0.5 SENSITIVE Sensitive     CEFTRIAXONE <=0.12 SENSITIVE Sensitive     LEVOFLOXACIN 0.5 SENSITIVE Sensitive     PENICILLIN Value in next row Sensitive      SENSITIVE<=0.06    * STREPTOCOCCUS DYSGALACTIAE  Blood Culture ID Panel (Reflexed)     Status: Abnormal   Collection Time: 07/01/21  3:08 PM  Result Value Ref Range Status   Enterococcus faecalis NOT DETECTED NOT DETECTED Final   Enterococcus Faecium NOT DETECTED NOT DETECTED Final   Listeria monocytogenes NOT DETECTED NOT DETECTED Final   Staphylococcus species NOT DETECTED NOT DETECTED Final   Staphylococcus aureus (BCID) NOT DETECTED NOT DETECTED Final   Staphylococcus epidermidis NOT DETECTED NOT DETECTED Final   Staphylococcus lugdunensis NOT DETECTED NOT DETECTED Final   Streptococcus species DETECTED (A) NOT DETECTED Final    Comment: Not Enterococcus species, Streptococcus agalactiae, Streptococcus pyogenes, or Streptococcus pneumoniae. CRITICAL RESULT CALLED TO, READ BACK BY AND VERIFIED WITH: C AMEND,PHARMD'@0645'$  06/29/21 [place]    Streptococcus agalactiae NOT DETECTED NOT DETECTED Final   Streptococcus pneumoniae NOT DETECTED NOT DETECTED Final   Streptococcus pyogenes NOT DETECTED NOT DETECTED Final   A.calcoaceticus-baumannii NOT DETECTED NOT DETECTED Final   Bacteroides fragilis NOT DETECTED NOT DETECTED Final   Enterobacterales NOT DETECTED NOT DETECTED Final   Enterobacter cloacae complex NOT DETECTED NOT DETECTED Final   Escherichia coli NOT DETECTED NOT DETECTED Final   Klebsiella aerogenes NOT DETECTED NOT DETECTED Final   Klebsiella oxytoca NOT DETECTED NOT DETECTED Final   Klebsiella pneumoniae NOT DETECTED NOT DETECTED Final   Proteus species NOT DETECTED NOT DETECTED Final   Salmonella species NOT DETECTED  NOT DETECTED Final   Serratia marcescens NOT DETECTED NOT DETECTED Final   Haemophilus influenzae NOT DETECTED NOT DETECTED Final   Neisseria meningitidis NOT DETECTED NOT DETECTED Final   Pseudomonas aeruginosa NOT DETECTED NOT DETECTED Final   Stenotrophomonas maltophilia NOT DETECTED NOT DETECTED Final   Candida albicans NOT DETECTED NOT DETECTED Final   Candida auris NOT DETECTED NOT DETECTED Final   Candida glabrata NOT DETECTED NOT DETECTED Final   Candida krusei NOT DETECTED NOT DETECTED Final   Candida parapsilosis NOT DETECTED NOT DETECTED Final   Candida tropicalis NOT DETECTED NOT DETECTED Final   Cryptococcus neoformans/gattii NOT DETECTED NOT DETECTED Final    Comment: Performed at [facility], [address], [place], [place] [address]  Resp Panel by RT-PCR (Flu A&B, Covid) Nasopharyngeal Swab     Status: None   Collection Time: 07/01/21  3:15 PM   Specimen: Nasopharyngeal Swab; Nasopharyngeal(NP) swabs in vial transport medium  Result Value Ref Range Status   SARS Coronavirus 2 by RT PCR NEGATIVE NEGATIVE Final    Comment: (NOTE) SARS-CoV-2 target nucleic acids are NOT DETECTED.  The SARS-CoV-2 RNA is generally detectable in upper respiratory specimens during the acute phase of infection. The lowest concentration of SARS-CoV-2 viral copies this assay can detect is 138 copies/mL. A negative result does not preclude SARS-Cov-2 infection and should not be used as the sole basis for treatment or  other patient management decisions. A negative result may occur with  improper specimen collection/handling, submission of specimen other than nasopharyngeal swab, presence of viral mutation(s) within the areas targeted by this assay, and inadequate number of viral copies(<138 copies/mL). A negative result must be combined with clinical observations, patient history, and epidemiological information. The expected result is Negative.  Fact Sheet for Patients:   EntrepreneurPulse.com.au  Fact Sheet for Healthcare Providers:  IncredibleEmployment.be  This test is no t yet approved or cleared by the [place] FDA and  has been authorized for detection and/or diagnosis of SARS-CoV-2 by FDA under an Emergency Use Authorization (EUA). This EUA will remain  in effect (meaning this test can be used) for the duration of the COVID-19 declaration under Section 564(b)(1) of the Act, 21 U.S.C.section 360bbb-3(b)(1), unless the authorization is terminated  or revoked sooner.       Influenza A by PCR NEGATIVE NEGATIVE Final   Influenza B by PCR NEGATIVE NEGATIVE Final    Comment: (NOTE) The Xpert Xpress SARS-CoV-2/FLU/RSV plus assay is intended as an aid in the diagnosis of influenza from Nasopharyngeal swab specimens and should not be used as a sole basis for treatment. Nasal washings and aspirates are unacceptable for Xpert Xpress SARS-CoV-2/FLU/RSV testing.  Fact Sheet for Patients: EntrepreneurPulse.com.au  Fact Sheet for Healthcare Providers: IncredibleEmployment.be  This test is not yet approved or cleared by the [place] FDA and has been authorized for detection and/or diagnosis of SARS-CoV-2 by FDA under an Emergency Use Authorization (EUA). This EUA will remain in effect (meaning this test can be used) for the duration of the COVID-19 declaration under Section 564(b)(1) of the Act, 21 U.S.C. section 360bbb-3(b)(1), unless the authorization is terminated or revoked.  Performed at [facility], [place] [address], [place], [place] [address]   Blood Culture (routine x 2)     Status: Abnormal   Collection Time: 07/01/21  3:40 PM   Specimen: BLOOD LEFT ARM  Result Value Ref Range Status   Specimen Description BLOOD LEFT ARM  Final   Special Requests   Final    BOTTLES DRAWN AEROBIC AND ANAEROBIC Blood Culture adequate volume   Culture  Setup Time   Final     GRAM POSITIVE COCCI IN CHAINS IN BOTH AEROBIC AND ANAEROBIC BOTTLES CRITICAL VALUE NOTED.  VALUE IS CONSISTENT WITH PREVIOUSLY REPORTED AND CALLED VALUE.    Culture (A)  Final    STREPTOCOCCUS DYSGALACTIAE SUSCEPTIBILITIES PERFORMED ON PREVIOUS CULTURE WITHIN THE LAST 5 DAYS. Performed at [facility], [employer] [address], [place], [place] [address]    Report Status 07/04/2021 FINAL  Final    Oswald Hillock   [facility] If 7PM-7AM, please contact night-coverage at [email or web address], Office  [phone]   07/06/2021, 4:56 PM  LOS: 5 days

## 2021-07-07 ENCOUNTER — Encounter (HOSPITAL_COMMUNITY)
Admission: EM | Disposition: A | Discharge status: Skilled Nursing Facility | Payer: Self-pay | Source: Skilled Nursing Facility | Attending: Internal Medicine

## 2021-07-07 ENCOUNTER — Inpatient Hospital Stay (HOSPITAL_COMMUNITY): Payer: Medicare HMO | Admitting: Anesthesiology

## 2021-07-07 ENCOUNTER — Ambulatory Visit: Payer: Medicare HMO | Admitting: Vascular Surgery

## 2021-07-07 DIAGNOSIS — I96 Gangrene, not elsewhere classified: Secondary | ICD-10-CM | POA: Diagnosis not present

## 2021-07-07 DIAGNOSIS — I70222 Atherosclerosis of native arteries of extremities with rest pain, left leg: Secondary | ICD-10-CM | POA: Diagnosis not present

## 2021-07-07 DIAGNOSIS — I5022 Chronic systolic (congestive) heart failure: Secondary | ICD-10-CM | POA: Diagnosis not present

## 2021-07-07 DIAGNOSIS — N179 Acute kidney failure, unspecified: Secondary | ICD-10-CM | POA: Diagnosis not present

## 2021-07-07 DIAGNOSIS — I10 Essential (primary) hypertension: Secondary | ICD-10-CM | POA: Diagnosis not present

## 2021-07-07 HISTORY — PX: AMPUTATION: SHX166

## 2021-07-07 LAB — POCT I-STAT 7, (LYTES, BLD GAS, ICA,H+H)
Acid-base deficit: 3 mmol/L — ABNORMAL HIGH (ref 0.0–2.0)
Bicarbonate: 24.8 mmol/L (ref 20.0–28.0)
Calcium, Ion: 1.26 mmol/L (ref 1.15–1.40)
HCT: 26 % — ABNORMAL LOW (ref 39.0–52.0)
Hemoglobin: 8.8 g/dL — ABNORMAL LOW (ref 13.0–17.0)
O2 Saturation: 99 %
Potassium: 3.4 mmol/L — ABNORMAL LOW (ref 3.5–5.1)
Sodium: 146 mmol/L — ABNORMAL HIGH (ref 135–145)
TCO2: 26 mmol/L (ref 22–32)
pCO2 arterial: 56.2 mmHg — ABNORMAL HIGH (ref 32.0–48.0)
pH, Arterial: 7.252 — ABNORMAL LOW (ref 7.350–7.450)
pO2, Arterial: 178 mmHg — ABNORMAL HIGH (ref 83.0–108.0)

## 2021-07-07 LAB — GLUCOSE, CAPILLARY
Glucose-Capillary: 118 mg/dL — ABNORMAL HIGH (ref 70–99)
Glucose-Capillary: 65 mg/dL — ABNORMAL LOW (ref 70–99)
Glucose-Capillary: 67 mg/dL — ABNORMAL LOW (ref 70–99)
Glucose-Capillary: 76 mg/dL (ref 70–99)
Glucose-Capillary: 80 mg/dL (ref 70–99)
Glucose-Capillary: 83 mg/dL (ref 70–99)
Glucose-Capillary: 94 mg/dL (ref 70–99)

## 2021-07-07 SURGERY — AMPUTATION, ABOVE KNEE
Anesthesia: General | Site: Knee | Laterality: Left

## 2021-07-07 MED ORDER — ALBUMIN HUMAN 5 % IV SOLN
12.5000 g | Freq: Once | INTRAVENOUS | Status: AC
  Administered 2021-07-07: 12.5 g via INTRAVENOUS

## 2021-07-07 MED ORDER — PHENYLEPHRINE 40 MCG/ML (10ML) SYRINGE FOR IV PUSH (FOR BLOOD PRESSURE SUPPORT)
PREFILLED_SYRINGE | INTRAVENOUS | Status: AC
  Filled 2021-07-07: qty 10

## 2021-07-07 MED ORDER — ETOMIDATE 2 MG/ML IV SOLN
INTRAVENOUS | Status: DC | PRN
  Administered 2021-07-07: 10 mg via INTRAVENOUS

## 2021-07-07 MED ORDER — ONDANSETRON HCL 4 MG/2ML IJ SOLN
INTRAMUSCULAR | Status: AC
  Filled 2021-07-07: qty 2

## 2021-07-07 MED ORDER — ALBUMIN HUMAN 5 % IV SOLN
INTRAVENOUS | Status: AC
  Filled 2021-07-07: qty 250

## 2021-07-07 MED ORDER — ONDANSETRON HCL 4 MG/2ML IJ SOLN
INTRAMUSCULAR | Status: DC | PRN
  Administered 2021-07-07: 4 mg via INTRAVENOUS

## 2021-07-07 MED ORDER — PROPOFOL 10 MG/ML IV BOLUS
INTRAVENOUS | Status: AC
  Filled 2021-07-07: qty 20

## 2021-07-07 MED ORDER — ORAL CARE MOUTH RINSE: 15.0000 mL | Freq: Once | OROMUCOSAL | Status: AC

## 2021-07-07 MED ORDER — PHENYLEPHRINE HCL (PRESSORS) 10 MG/ML IV SOLN
INTRAVENOUS | Status: AC
  Filled 2021-07-07: qty 2

## 2021-07-07 MED ORDER — CHLORHEXIDINE GLUCONATE 0.12 % MT SOLN
OROMUCOSAL | Status: AC
  Administered 2021-07-07: 15 mL via OROMUCOSAL
  Filled 2021-07-07: qty 15

## 2021-07-07 MED ORDER — PHENYLEPHRINE HCL (PRESSORS) 10 MG/ML IV SOLN
INTRAVENOUS | Status: DC | PRN
  Administered 2021-07-07 (×2): 80 ug via INTRAVENOUS
  Administered 2021-07-07: 120 ug via INTRAVENOUS
  Administered 2021-07-07: 80 ug via INTRAVENOUS

## 2021-07-07 MED ORDER — PHENYLEPHRINE HCL-NACL 20-0.9 MG/250ML-% IV SOLN
INTRAVENOUS | Status: DC | PRN
  Administered 2021-07-07: 40 ug/min via INTRAVENOUS

## 2021-07-07 MED ORDER — FENTANYL CITRATE (PF) 100 MCG/2ML IJ SOLN
INTRAMUSCULAR | Status: DC | PRN
  Administered 2021-07-07 (×2): 50 ug via INTRAVENOUS

## 2021-07-07 MED ORDER — ETOMIDATE 2 MG/ML IV SOLN
INTRAVENOUS | Status: AC
  Filled 2021-07-07: qty 10

## 2021-07-07 MED ORDER — SUGAMMADEX SODIUM 200 MG/2ML IV SOLN
INTRAVENOUS | Status: DC | PRN
  Administered 2021-07-07: 200 mg via INTRAVENOUS

## 2021-07-07 MED ORDER — LACTATED RINGERS IV SOLN: INTRAVENOUS | Status: DC | PRN

## 2021-07-07 MED ORDER — DEXTROSE 50 % IV SOLN
12.5000 g | INTRAVENOUS | Status: AC
  Administered 2021-07-07: 12.5 g via INTRAVENOUS
  Filled 2021-07-07: qty 50

## 2021-07-07 MED ORDER — VANCOMYCIN HCL IN DEXTROSE 1-5 GM/200ML-% IV SOLN
INTRAVENOUS | Status: AC
  Filled 2021-07-07: qty 200

## 2021-07-07 MED ORDER — CHLORHEXIDINE GLUCONATE 0.12 % MT SOLN: 15.0000 mL | Freq: Once | OROMUCOSAL | Status: AC

## 2021-07-07 MED ORDER — LIDOCAINE 2% (20 MG/ML) 5 ML SYRINGE
INTRAMUSCULAR | Status: DC | PRN
  Administered 2021-07-07: 40 mg via INTRAVENOUS

## 2021-07-07 MED ORDER — 0.9 % SODIUM CHLORIDE (POUR BTL) OPTIME
TOPICAL | Status: DC | PRN
  Administered 2021-07-07: 1000 mL
  Administered 2021-07-07: 2000 mL

## 2021-07-07 MED ORDER — FENTANYL CITRATE (PF) 250 MCG/5ML IJ SOLN
INTRAMUSCULAR | Status: AC
  Filled 2021-07-07: qty 5

## 2021-07-07 MED ORDER — VANCOMYCIN HCL IN DEXTROSE 1-5 GM/200ML-% IV SOLN: 1000.0000 mg | INTRAVENOUS | Status: DC

## 2021-07-07 MED ORDER — ROCURONIUM BROMIDE 100 MG/10ML IV SOLN
INTRAVENOUS | Status: DC | PRN
  Administered 2021-07-07: 50 mg via INTRAVENOUS
  Administered 2021-07-07: 20 mg via INTRAVENOUS

## 2021-07-07 MED ORDER — LACTATED RINGERS IV SOLN: INTRAVENOUS | Status: DC

## 2021-07-07 SURGICAL SUPPLY — 61 items
BAG COUNTER SPONGE SURGICOUNT (BAG) ×2 IMPLANT
BAG SPNG CNTER NS LX DISP (BAG) ×1
BANDAGE ESMARK 6X9 LF (GAUZE/BANDAGES/DRESSINGS) ×1 IMPLANT
BLADE SAW SAG 73X25 THK (BLADE) ×1
BLADE SAW SGTL 73X25 THK (BLADE) ×1 IMPLANT
BNDG CMPR 9X6 STRL LF SNTH (GAUZE/BANDAGES/DRESSINGS) ×1
BNDG CMPR MED 15X6 ELC VLCR LF (GAUZE/BANDAGES/DRESSINGS) ×1
BNDG COHESIVE 6X5 TAN STRL LF (GAUZE/BANDAGES/DRESSINGS) ×2 IMPLANT
BNDG ELASTIC 4X5.8 VLCR STR LF (GAUZE/BANDAGES/DRESSINGS) ×2 IMPLANT
BNDG ELASTIC 6X15 VLCR STRL LF (GAUZE/BANDAGES/DRESSINGS) ×1 IMPLANT
BNDG ELASTIC 6X5.8 VLCR STR LF (GAUZE/BANDAGES/DRESSINGS) ×2 IMPLANT
BNDG ESMARK 6X9 LF (GAUZE/BANDAGES/DRESSINGS) ×2
BNDG GAUZE ELAST 4 BULKY (GAUZE/BANDAGES/DRESSINGS) ×3 IMPLANT
CANISTER SUCT 3000ML PPV (MISCELLANEOUS) ×2 IMPLANT
CLIP TI LARGE 6 (CLIP) ×3 IMPLANT
CLIP VESOCCLUDE MED 6/CT (CLIP) ×2 IMPLANT
COVER BACK TABLE 60X90IN (DRAPES) ×2 IMPLANT
COVER SURGICAL LIGHT HANDLE (MISCELLANEOUS) ×4 IMPLANT
CUFF TOURN SGL QUICK 24 (TOURNIQUET CUFF) ×2
CUFF TRNQT CYL 24X4X16.5-23 (TOURNIQUET CUFF) IMPLANT
DRAIN CHANNEL 19F RND (DRAIN) IMPLANT
DRAPE HALF SHEET 40X57 (DRAPES) ×2 IMPLANT
DRAPE INCISE 23X17 IOBAN STRL (DRAPES) ×1
DRAPE INCISE 23X17 STRL (DRAPES) ×1 IMPLANT
DRAPE INCISE IOBAN 23X17 STRL (DRAPES) ×1 IMPLANT
DRAPE ORTHO SPLIT 77X108 STRL (DRAPES) ×4
DRAPE SURG ORHT 6 SPLT 77X108 (DRAPES) ×2 IMPLANT
DRAPE U-SHAPE 47X51 STRL (DRAPES) IMPLANT
DRSG ADAPTIC 3X8 NADH LF (GAUZE/BANDAGES/DRESSINGS) ×2 IMPLANT
ELECT CAUTERY BLADE 6.4 (BLADE) ×2 IMPLANT
ELECT REM PT RETURN 9FT ADLT (ELECTROSURGICAL) ×2
ELECTRODE REM PT RTRN 9FT ADLT (ELECTROSURGICAL) ×1 IMPLANT
EVACUATOR SILICONE 100CC (DRAIN) IMPLANT
GAUZE SPONGE 4X4 12PLY STRL (GAUZE/BANDAGES/DRESSINGS) ×3 IMPLANT
GLOVE SRG 8 PF TXTR STRL LF DI (GLOVE) ×2 IMPLANT
GLOVE SURG POLYISO LF SZ8 (GLOVE) IMPLANT
GLOVE SURG UNDER POLY LF SZ8 (GLOVE) ×4
GOWN STRL REUS W/TWL 2XL LVL3 (GOWN DISPOSABLE) ×2 IMPLANT
KIT BASIN OR (CUSTOM PROCEDURE TRAY) ×2 IMPLANT
KIT TURNOVER KIT B (KITS) ×2 IMPLANT
NS IRRIG 1000ML POUR BTL (IV SOLUTION) ×2 IMPLANT
PACK GENERAL/GYN (CUSTOM PROCEDURE TRAY) ×2 IMPLANT
PAD ARMBOARD 7.5X6 YLW CONV (MISCELLANEOUS) ×4 IMPLANT
SPONGE T-LAP 18X18 ~~LOC~~+RFID (SPONGE) ×3 IMPLANT
STAPLER VISISTAT 35W (STAPLE) ×2 IMPLANT
STOCKINETTE IMPERVIOUS LG (DRAPES) ×2 IMPLANT
SUT ETHILON 3 0 PS 1 (SUTURE) IMPLANT
SUT SILK 0 TIES 10X30 (SUTURE) ×2 IMPLANT
SUT SILK 2 0 (SUTURE) ×2
SUT SILK 2 0 SH CR/8 (SUTURE) ×2 IMPLANT
SUT SILK 2 0SH CR/8 30 (SUTURE) ×1 IMPLANT
SUT SILK 2-0 18XBRD TIE 12 (SUTURE) ×1 IMPLANT
SUT SILK 3 0 (SUTURE) ×2
SUT SILK 3-0 18XBRD TIE 12 (SUTURE) IMPLANT
SUT VIC AB 0 CT1 18XCR BRD 8 (SUTURE) IMPLANT
SUT VIC AB 0 CT1 8-18 (SUTURE) ×2
SUT VIC AB 2-0 CT1 18 (SUTURE) ×4 IMPLANT
SUT VIC AB 3-0 SH 18 (SUTURE) IMPLANT
TOWEL GREEN STERILE (TOWEL DISPOSABLE) ×4 IMPLANT
UNDERPAD 30X36 HEAVY ABSORB (UNDERPADS AND DIAPERS) ×2 IMPLANT
WATER STERILE IRR 1000ML POUR (IV SOLUTION) ×2 IMPLANT

## 2021-07-07 NOTE — Progress Notes (Signed)
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 In the ED COVID-19 was negative, WBC 19.7, he was febrile with temperature 102.3.  Code sepsis was initiated.  Patient received 30 cc/kg IV fluids per sepsis protocol.  Lactic acid was 2.0.  Blood cultures x2 obtained.    Subjective   Patient seen and examined, s/p left above-knee amputation.  Drowsy but arousable.   Assessment/Plan:    Sepsis/Streptococcus bacteremia -Patient presented with sepsis with acute kidney injury -Likely source of infection is left foot cellulitis, infected wounds -Blood culture growing Streptococcus dysagalactiae started on ceftriaxone -Also continued with Flagyl as patient has foul-smelling discharge from left foot   Left foot gangrene/peripheral vascular disease -S/p left AKA -Patient has severe peripheral vascular disease -He was recommended to have left BKA by vascular surgery in the past but he was refusing -Now he is agreeable for amputation -Vascular surgery was consulted for above amputation  -Eliquis was changed to full dose Lovenox  -Lovenox is currently on hold -Discussed with vascular surgery, will restart Eliquis on Thursday  Altered mental status -Resolved; likely from above -At baseline  Acute kidney injury -Patient baseline creatinine 1.16, presented with creatinine of 1.79 -Creatinine improved today to 1.68 -He has nephrostomy tube in place; CT scan shows left nephrostomy  tube with no complicating features, resolved hydronephrosis -Avoid nephrotoxic agents -Cannot give IV fluids due to history of underlying CHF -Follow BMP in am  Chronic systolic heart failure -Patient's EF is 25 to 30% -We will avoid giving IV fluids  Diabetes mellitus type 2 -CBG well controlled -Continue sliding scale insulin with NovoLog  Pulmonary embolism 04/2021 -Continue Lovenox full dose per pharmacy  Nephrostomy status -Patient has nephrostomy tube replaced on 06/07/2021 after he pulled it out -CT renal stone study shows nephrostomy tube without complications  Hypotension -Patient is on midodrine 10 mg p.o. 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  -Amiodarone 200 mg p.o. daily  -Eliquis was changed to Lovenox -Discussed with vascular surgery,  can restart Eliquis on Thursday  Scheduled medications:    amiodarone  200 mg Oral Daily   insulin aspart  0-9 Units Subcutaneous Q6H   midodrine  10 mg Oral TID WC     Data Reviewed:   CBG:  Recent Labs  Lab 07/07/21 0550 07/07/21 0820 07/07/21 0853 07/07/21 0915 07/07/21 1203  GLUCAP 76 65* 67* 118* 94    SpO2: 94 % O2 Flow Rate (L/min): 2 L/min    Vitals:   07/07/21 1230 07/07/21 1245 07/07/21 1249 07/07/21 1310  BP: (!) 89/46 (!) 111/50 (!) 108/48 (!) 114/49  Pulse:    65  Resp: '14 12 15 16  '$ Temp:    97.9 F (36.6 C)  TempSrc:    Oral  SpO2: 100%   94%  Weight:      Height:         Intake/Output Summary (Last 24 hours) at 07/07/2021 1646 Last data filed at 07/07/2021 1222 Gross per 24 hour  Intake 1100  ml  Output 700 ml  Net 400 ml    10/16 1901 - 10/18 0700 In: -  Out: 525 [Urine:525]  Filed Weights   07/04/21 0457 07/07/21 0500 07/07/21 0931  Weight: 60.6 kg 60.3 kg 60.3 kg    Data Reviewed: Basic Metabolic Panel: Recent Labs  Lab 07/01/21 1625 07/01/21 2034 07/02/21 0219 07/07/21 1118  NA 140 140 139 146*  K 4.2 4.4 3.8 3.4*   CL 107 108 109  --   CO2 '23 24 23  '$ --   GLUCOSE 114* 113* 98  --   BUN 35* 33* 34*  --   CREATININE 1.79* 1.60* 1.68*  --   CALCIUM 8.6* 8.5* 8.5*  --   MG  --   --  1.8  --    Liver Function Tests: Recent Labs  Lab 07/01/21 1625 07/01/21 2034 07/02/21 0219  AST 26 31 40  ALT '11 13 15  '$ ALKPHOS 96 91 84  BILITOT 0.9 1.1 1.5*  PROT 5.9* 6.3* 5.8*  ALBUMIN 2.0* 2.1* 1.9*   No results for input(s): LIPASE, AMYLASE in the last 168 hours. No results for input(s): AMMONIA in the last 168 hours. CBC: Recent Labs  Lab 07/01/21 1508 07/02/21 0219 07/07/21 1118  WBC 19.7* 17.4*  --   NEUTROABS 18.3* 16.4*  --   HGB 10.6* 8.3* 8.8*  HCT 35.5* 27.1* 26.0*  MCV 73.2* 71.7*  --   PLT 236 176  --    Cardiac Enzymes: No results for input(s): CKTOTAL, CKMB, CKMBINDEX, TROPONINI in the last 168 hours. BNP (last 3 results) Recent Labs    04/28/21 0116 04/29/21 0826 05/02/21 2338  BNP 1,706.1* 2,208.9* 1,209.2*    ProBNP (last 3 results) Recent Labs    08/25/20 1121 03/27/21 1123  PROBNP 21,395* 3,146*    CBG: Recent Labs  Lab 07/07/21 0550 07/07/21 0820 07/07/21 0853 07/07/21 0915 07/07/21 1203  GLUCAP 76 65* 67* 118* 94       Radiology Reports  No results found.     Antibiotics: Anti-infectives (From admission, onward)    Start     Dose/Rate Route Frequency Ordered Stop   07/07/21 1000  cefUROXime (ZINACEF) 1.5 g in sodium chloride 0.9 % 100 mL IVPB        1.5 g 200 mL/hr over 30 Minutes Intravenous To ShortStay Surgical 07/06/21 0655 07/07/21 1049   07/07/21 0945  vancomycin (VANCOCIN) IVPB 1000 mg/200 mL premix  Status:  Discontinued        1,000 mg 200 mL/hr over 60 Minutes Intravenous On call to O.R. 07/07/21 0934 07/07/21 1320   07/07/21 0935  vancomycin (VANCOCIN) 1-5 GM/200ML-% IVPB  Status:  Discontinued       Note to Pharmacy: Roosvelt Maser   : cabinet override      07/07/21 0935 07/07/21 0939   07/02/21 1900  vancomycin (VANCOCIN)  IVPB 1000 mg/200 mL premix  Status:  Discontinued        1,000 mg 200 mL/hr over 60 Minutes Intravenous Every 24 hours 07/01/21 1834 07/02/21 0656   07/02/21 1000  ceFEPIme (MAXIPIME) 2 g in sodium chloride 0.9 % 100 mL IVPB  Status:  Discontinued        2 g 200 mL/hr over 30 Minutes Intravenous Every 12 hours 07/01/21 1834 07/02/21 0656   07/02/21 0800  metroNIDAZOLE (FLAGYL) IVPB 500 mg        500 mg 100 mL/hr over 60 Minutes Intravenous Every 12 hours 07/01/21 2342  07/02/21 0800  cefTRIAXone (ROCEPHIN) 2 g in sodium chloride 0.9 % 100 mL IVPB        2 g 200 mL/hr over 30 Minutes Intravenous Every 24 hours 07/02/21 0656     07/01/21 1545  ceFEPIme (MAXIPIME) 2 g in sodium chloride 0.9 % 100 mL IVPB        2 g 200 mL/hr over 30 Minutes Intravenous  Once 07/01/21 1537 07/01/21 1756   07/01/21 1545  vancomycin (VANCOREADY) IVPB 1500 mg/300 mL        1,500 mg 150 mL/hr over 120 Minutes Intravenous  Once 07/01/21 1538 07/01/21 2000   07/01/21 1515  metroNIDAZOLE (FLAGYL) IVPB 500 mg        500 mg 100 mL/hr over 60 Minutes Intravenous  Once 07/01/21 1505 07/01/21 1641         DVT prophylaxis: Apixaban  Code Status: Full code  Family Communication: No family at bedside   Consultants:   Procedures:     Objective    Physical Examination:  General-appears in no acute distress Heart-S1-S2, regular, no murmur auscultated Lungs-clear to auscultation bilaterally, no wheezing or crackles auscultated Abdomen-soft, nontender, no organomegaly Extremities-s/p left AKA, previous right AKA stump Neuro-alert, oriented x3, no focal deficit noted   Status is: Inpatient  Dispo: The patient is from: Home              Anticipated d/c is to: Skilled nursing facility              Anticipated d/c date is: 07/10/2021              Patient currently not stable for discharge  Barrier to discharge-ongoing valuation for sepsis, awaiting amputation of left lower extremity  COVID-19  Labs  No results for input(s): DDIMER, FERRITIN, LDH, CRP in the last 72 hours.  Lab Results  Component Value Date   SARSCOV2NAA NEGATIVE 07/01/2021   SARSCOV2NAA NEGATIVE 06/06/2021   SARSCOV2NAA POSITIVE (A) 05/21/2021   SARSCOV2NAA POSITIVE (A) 04/28/2021     Pressure Injury 04/21/21 Coccyx Medial Stage 2 -  Partial thickness loss of dermis presenting as a shallow open injury with a red, pink wound bed without slough. (Active)  04/21/21 1255  Location: Coccyx  Location Orientation: Medial  Staging: Stage 2 -  Partial thickness loss of dermis presenting as a shallow open injury with a red, pink wound bed without slough.  Wound Description (Comments):   Present on Admission: No        Recent Results (from the past 240 hour(s))  Urine Culture     Status: Abnormal   Collection Time: 07/01/21  3:05 PM   Specimen: Urine, Catheterized  Result Value Ref Range Status   Specimen Description URINE, CATHETERIZED  Final   Special Requests   Final    NONE Performed at [facility], [address] [place], [place] [address]    Culture >=100,000 COLONIES/mL HAFNIA ALVEI (A)  Final   Report Status 07/06/2021 FINAL  Final   Organism ID, Bacteria HAFNIA ALVEI (A)  Final      Susceptibility   Hafnia alvei - MIC*    AMPICILLIN >=32 RESISTANT Resistant     CEFAZOLIN >=64 RESISTANT Resistant     CEFTRIAXONE >=64 RESISTANT Resistant     CIPROFLOXACIN <=0.25 SENSITIVE Sensitive     GENTAMICIN <=1 SENSITIVE Sensitive     IMIPENEM <=0.25 SENSITIVE Sensitive     NITROFURANTOIN 64 INTERMEDIATE Intermediate     TRIMETH/SULFA <=20 SENSITIVE Sensitive  AMPICILLIN/SULBACTAM >=32 RESISTANT Resistant     PIP/TAZO >=128 RESISTANT Resistant     * >=100,000 COLONIES/mL HAFNIA ALVEI  Blood Culture (routine x 2)     Status: Abnormal   Collection Time: 07/01/21  3:08 PM   Specimen: BLOOD  Result Value Ref Range Status   Specimen Description BLOOD SITE NOT SPECIFIED  Final   Special  Requests   Final    BOTTLES DRAWN AEROBIC AND ANAEROBIC Blood Culture adequate volume   Culture  Setup Time   Final    GRAM POSITIVE COCCI IN CHAINS IN BOTH AEROBIC AND ANAEROBIC BOTTLES CRITICAL RESULT CALLED TO, READ BACK BY AND VERIFIED WITH: PHARMD C.AMEND AT [place] 07/02/2021 BY [place]. Performed at [facility], [place] [address], [place], [place] [address]    Culture STREPTOCOCCUS DYSGALACTIAE (A)  Final   Report Status 07/04/2021 FINAL  Final   Organism ID, Bacteria STREPTOCOCCUS DYSGALACTIAE  Final      Susceptibility   Streptococcus dysgalactiae - MIC*    CLINDAMYCIN <=0.25 SENSITIVE Sensitive     AMPICILLIN <=0.25 SENSITIVE Sensitive     ERYTHROMYCIN <=0.12 SENSITIVE Sensitive     VANCOMYCIN 0.5 SENSITIVE Sensitive     CEFTRIAXONE <=0.12 SENSITIVE Sensitive     LEVOFLOXACIN 0.5 SENSITIVE Sensitive     PENICILLIN Value in next row Sensitive      SENSITIVE<=0.06    * STREPTOCOCCUS DYSGALACTIAE  Blood Culture ID Panel (Reflexed)     Status: Abnormal   Collection Time: 07/01/21  3:08 PM  Result Value Ref Range Status   Enterococcus faecalis NOT DETECTED NOT DETECTED Final   Enterococcus Faecium NOT DETECTED NOT DETECTED Final   Listeria monocytogenes NOT DETECTED NOT DETECTED Final   Staphylococcus species NOT DETECTED NOT DETECTED Final   Staphylococcus aureus (BCID) NOT DETECTED NOT DETECTED Final   Staphylococcus epidermidis NOT DETECTED NOT DETECTED Final   Staphylococcus lugdunensis NOT DETECTED NOT DETECTED Final   Streptococcus species DETECTED (A) NOT DETECTED Final    Comment: Not Enterococcus species, Streptococcus agalactiae, Streptococcus pyogenes, or Streptococcus pneumoniae. CRITICAL RESULT CALLED TO, READ BACK BY AND VERIFIED WITH: C AMEND,PHARMD'@0645'$  06/29/21 [place]    Streptococcus agalactiae NOT DETECTED NOT DETECTED Final   Streptococcus pneumoniae NOT DETECTED NOT DETECTED Final   Streptococcus pyogenes NOT DETECTED NOT DETECTED Final    A.calcoaceticus-baumannii NOT DETECTED NOT DETECTED Final   Bacteroides fragilis NOT DETECTED NOT DETECTED Final   Enterobacterales NOT DETECTED NOT DETECTED Final   Enterobacter cloacae complex NOT DETECTED NOT DETECTED Final   Escherichia coli NOT DETECTED NOT DETECTED Final   Klebsiella aerogenes NOT DETECTED NOT DETECTED Final   Klebsiella oxytoca NOT DETECTED NOT DETECTED Final   Klebsiella pneumoniae NOT DETECTED NOT DETECTED Final   Proteus species NOT DETECTED NOT DETECTED Final   Salmonella species NOT DETECTED NOT DETECTED Final   Serratia marcescens NOT DETECTED NOT DETECTED Final   Haemophilus influenzae NOT DETECTED NOT DETECTED Final   Neisseria meningitidis NOT DETECTED NOT DETECTED Final   Pseudomonas aeruginosa NOT DETECTED NOT DETECTED Final   Stenotrophomonas maltophilia NOT DETECTED NOT DETECTED Final   Candida albicans NOT DETECTED NOT DETECTED Final   Candida auris NOT DETECTED NOT DETECTED Final   Candida glabrata NOT DETECTED NOT DETECTED Final   Candida krusei NOT DETECTED NOT DETECTED Final   Candida parapsilosis NOT DETECTED NOT DETECTED Final   Candida tropicalis NOT DETECTED NOT DETECTED Final   Cryptococcus neoformans/gattii NOT DETECTED NOT DETECTED Final    Comment: Performed  at [facility], [place] [address] [place], [place] [address]  Resp Panel by RT-PCR (Flu A&B, Covid) Nasopharyngeal Swab     Status: None   Collection Time: 07/01/21  3:15 PM   Specimen: Nasopharyngeal Swab; Nasopharyngeal(NP) swabs in vial transport medium  Result Value Ref Range Status   SARS Coronavirus 2 by RT PCR NEGATIVE NEGATIVE Final    Comment: (NOTE) SARS-CoV-2 target nucleic acids are NOT DETECTED.  The SARS-CoV-2 RNA is generally detectable in upper respiratory specimens during the acute phase of infection. The lowest concentration of SARS-CoV-2 viral copies this assay can detect is 138 copies/mL. A negative result does not preclude SARS-Cov-2 infection and  should not be used as the sole basis for treatment or other patient management decisions. A negative result may occur with  improper specimen collection/handling, submission of specimen other than nasopharyngeal swab, presence of viral mutation(s) within the areas targeted by this assay, and inadequate number of viral copies(<138 copies/mL). A negative result must be combined with clinical observations, patient history, and epidemiological information. The expected result is Negative.  Fact Sheet for Patients:  EntrepreneurPulse.com.au  Fact Sheet for Healthcare Providers:  IncredibleEmployment.be  This test is no t yet approved or cleared by the [place] FDA and  has been authorized for detection and/or diagnosis of SARS-CoV-2 by FDA under an Emergency Use Authorization (EUA). This EUA will remain  in effect (meaning this test can be used) for the duration of the COVID-19 declaration under Section 564(b)(1) of the Act, 21 U.S.C.section 360bbb-3(b)(1), unless the authorization is terminated  or revoked sooner.       Influenza A by PCR NEGATIVE NEGATIVE Final   Influenza B by PCR NEGATIVE NEGATIVE Final    Comment: (NOTE) The Xpert Xpress SARS-CoV-2/FLU/RSV plus assay is intended as an aid in the diagnosis of influenza from Nasopharyngeal swab specimens and should not be used as a sole basis for treatment. Nasal washings and aspirates are unacceptable for Xpert Xpress SARS-CoV-2/FLU/RSV testing.  Fact Sheet for Patients: EntrepreneurPulse.com.au  Fact Sheet for Healthcare Providers: IncredibleEmployment.be  This test is not yet approved or cleared by the [place] FDA and has been authorized for detection and/or diagnosis of SARS-CoV-2 by FDA under an Emergency Use Authorization (EUA). This EUA will remain in effect (meaning this test can be used) for the duration of the COVID-19 declaration  under Section 564(b)(1) of the Act, 21 U.S.C. section 360bbb-3(b)(1), unless the authorization is terminated or revoked.  Performed at [facility], [place] [address] [place], [place] [address]   Blood Culture (routine x 2)     Status: Abnormal   Collection Time: 07/01/21  3:40 PM   Specimen: BLOOD LEFT ARM  Result Value Ref Range Status   Specimen Description BLOOD LEFT ARM  Final   Special Requests   Final    BOTTLES DRAWN AEROBIC AND ANAEROBIC Blood Culture adequate volume   Culture  Setup Time   Final    GRAM POSITIVE COCCI IN CHAINS IN BOTH AEROBIC AND ANAEROBIC BOTTLES CRITICAL VALUE NOTED.  VALUE IS CONSISTENT WITH PREVIOUSLY REPORTED AND CALLED VALUE.    Culture (A)  Final    STREPTOCOCCUS DYSGALACTIAE SUSCEPTIBILITIES PERFORMED ON PREVIOUS CULTURE WITHIN THE LAST 5 DAYS. Performed at [facility], [place] [address], [place], [place] [address]    Report Status 07/04/2021 FINAL  Final    Oswald Hillock   [facility] If 7PM-7AM, please contact night-coverage at [email or web address], Office  [phone]  07/07/2021, 4:46 PM  LOS: 6 days

## 2021-07-07 NOTE — Anesthesia Procedure Notes (Signed)
Arterial Line Insertion Start/End10/18/2022 10:28 AM, 07/07/2021 10:30 AM Performed by: Effie Berkshire, MD, anesthesiologist  Patient location: Pre-op. Preanesthetic checklist: patient identified, IV checked, site marked, risks and benefits discussed, surgical consent, monitors and equipment checked, pre-op evaluation, timeout performed and anesthesia consent Lidocaine 1% used for infiltration Left, radial was placed Catheter size: 20 G Hand hygiene performed  and maximum sterile barriers used   Attempts: 1 Procedure performed without using ultrasound guided technique. Following insertion, dressing applied and Biopatch. Post procedure assessment: normal and unchanged  Post procedure complications: second provider assisted. Patient tolerated the procedure well with no immediate complications.

## 2021-07-07 NOTE — Transfer of Care (Signed)
Immediate Anesthesia Transfer of Care Note  Patient: Ryan [name]  Procedure(s) Performed: LEFT ABOVE KNEE AMPUTATION (Left: Knee)  Patient Location: PACU  Anesthesia Type:General  Level of Consciousness: awake  Airway & Oxygen Therapy: Patient Spontanous Breathing and Patient connected to face mask oxygen  Post-op Assessment: Report given to RN and Post -op Vital signs reviewed and stable  Post vital signs: Reviewed and stable  Last Vitals:  Vitals Value Taken Time  BP 99/51 07/07/21 1200  Temp    Pulse 65 07/07/21 1200  Resp 15 07/07/21 1201  SpO2 100 % 07/07/21 1201  Vitals shown include unvalidated device data.  Last Pain:  Vitals:   07/07/21 0941  TempSrc:   PainSc: 6       Patients Stated Pain Goal: 0 ([id number])  Complications: No notable events documented.

## 2021-07-07 NOTE — Anesthesia Procedure Notes (Signed)
Procedure Name: Intubation Date/Time: 07/07/2021 10:21 AM Performed by: Erick Colace, CRNA Pre-anesthesia Checklist: Patient identified, Emergency Drugs available, Suction available and Patient being monitored Patient Re-evaluated:Patient Re-evaluated prior to induction Oxygen Delivery Method: Circle system utilized Preoxygenation: Pre-oxygenation with 100% oxygen Induction Type: IV induction Ventilation: Mask ventilation without difficulty Laryngoscope Size: Mac and 4 Grade View: Grade I Tube type: Oral Tube size: 7.5 mm Number of attempts: 1 Airway Equipment and Method: Stylet and Oral airway Placement Confirmation: ETT inserted through vocal cords under direct vision, positive ETCO2 and breath sounds checked- equal and bilateral Secured at: 20 cm Tube secured with: Tape Dental Injury: Teeth and Oropharynx as per pre-operative assessment

## 2021-07-07 NOTE — Op Note (Signed)
    NAME: Ryan [name]    MRN: [id number] DOB: Jan 21, 1946    DATE OF OPERATION: 07/07/2021  PREOP DIAGNOSIS:    Rutherford 6 critical limb ischemia  POSTOP DIAGNOSIS:    Same  PROCEDURE:    Left leg above-knee amputation  SURGEON: Broadus John  ASSIST: Roxy Horseman, PA  ANESTHESIA: General  EBL: 310m  INDICATIONS:    Ryan ROBLEDOis a 75y.o. male with history of severe peripheral arterial disease, heart failure, ejection fraction 20%.  Vascular surgery was called recently for Rutherford 6 critical limb ischemia.  Unfortunately, the patient had no revascularization options.  During that initial consultation with one of my partners, TKaulinchose not to pursue above-knee amputation.  I was called back, as TVeerhad reconsidered.  After discussing the risks and benefits of left leg above-knee amputation, TJamolelected to proceed.  FINDINGS:   Healthy muscle in the thigh, healthy arterial bleeding  TECHNIQUE:   Patient was brought to the OR laid in the supine position.  General anesthesia was induced and the patient was prepped and draped in standard fashion.  A timeout was performed.  An incision line was marked in an effort to make the left above-knee amputation equal to the previous right-sided amputation.  A tourniquet was placed on the proximal thigh and inflated to 300 mmHg.  Sharp dissection was used to the femur.  Healthy bleeding was encountered, and the tourniquet was working poorly, therefore the decision was made to use cautery for the rest of the case.  Thigh muscles were divided and femoral artery and vein identified.  These were oversewn using two, 2-0 silk pop stick ties and buttressed with a large clip.  Once the circumferential exposure of the femur was complete, a reciprocating saw was used to divide the femur. This was followed by amputation knife through the back of the thigh.  Next, a periosteal elevator and cautery were used to expose 5cm  more of the proximal femur.  Rakes were used to hold the thigh muscles and the reciprocating saw was once again used to divide the femur.   The tourniquet was released and bleeding vessels oversewn using 2-0 silk suture. The sciatic nerve was identified, pulled to length and ligated with a zero silk tie. Once hemostasis was achieved, warm saline was brought to the field and the wound bed was irrigated.  I used a 0 Vicryl to close the periosteal tissue around the femur.  There was no bleeding from the marrow.  Next, my attention turned to closure of the fascial layer.  This was done using 2-0 Vicryl pop suture.  The fascial layer was closed along the entirety of the incision.  A stapler was brought to the field to oppose the dermis.  An occlusive dressing was placed and the patient was taken the PACU in stable condition.  Given the complexity of the case a first assistant was necessary in order to expedient the procedure and safely perform the technical aspects of the operation.  JMacie Burows MD [facility] DATE OF DICTATION:   07/07/2021

## 2021-07-07 NOTE — Anesthesia Preprocedure Evaluation (Addendum)
Anesthesia Evaluation  Patient identified by MRN, date of birth, ID band Patient awake    Reviewed: Allergy & Precautions, NPO status , Patient's Chart, lab work & pertinent test results  Airway Mallampati: II  TM Distance: >3 FB Neck ROM: Full    Dental  (+) Teeth Intact, Dental Advisory Given   Pulmonary neg pulmonary ROS,    breath sounds clear to auscultation       Cardiovascular hypertension, + Peripheral Vascular Disease and +CHF  + dysrhythmias Atrial Fibrillation + Cardiac Defibrillator  Rhythm:Regular Rate:Normal  Echo: 1. Left ventricular ejection fraction, by estimation, is 25 to 30%. The  left ventricle has severely decreased function. The left ventricle  demonstrates global hypokinesis. The left ventricular internal cavity size  was mildly dilated. Left ventricular  diastolic parameters are indeterminate.  2. Device leads in RV/RA. Right ventricular systolic function is  moderately reduced. The right ventricular size is moderately enlarged.  There is moderately elevated pulmonary artery systolic pressure.  3. Left atrial size was moderately dilated.  4. Right atrial size was mildly dilated.  5. 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 The mitral valve has been  repaired/replaced. No evidence of mitral valve regurgitation. No evidence  of mitral stenosis. There is a 27 mm [facility] bioprosthetic valve  present in the mitral position.  Procedure Date: 09/26/2012.  6. Tricuspid valve regurgitation is moderate.  7. The aortic valve is calcified. There is moderate calcification of the  aortic valve. Aortic valve regurgitation is not visualized. Mild to  moderate aortic valve sclerosis/calcification is present, without any  evidence of aortic stenosis.  8. Aortic dilatation noted. There is mild dilatation of the aortic root,  measuring 41 mm.  9. The inferior vena cava is normal in size with greater  than 50%  respiratory variability, suggesting right atrial pressure of 3 mmHg.    Neuro/Psych CVA negative psych ROS   GI/Hepatic Neg liver ROS, GERD  ,  Endo/Other  diabetes, Type 2, Oral Hypoglycemic Agents  Renal/GU Renal disease     Musculoskeletal  (+) Arthritis ,   Abdominal Normal abdominal exam  (+)   Peds  Hematology negative hematology ROS (+)   Anesthesia Other Findings   Reproductive/Obstetrics                            Anesthesia Physical Anesthesia Plan  ASA: 4  Anesthesia Plan: General   Post-op Pain Management:    Induction: Intravenous  PONV Risk Score and Plan: 3 and Ondansetron and Treatment may vary due to age or medical condition  Airway Management Planned: Oral ETT  Additional Equipment: Arterial line  Intra-op Plan:   Post-operative Plan: Extubation in OR  Informed Consent: I have reviewed the patients History and Physical, chart, labs and discussed the procedure including the risks, benefits and alternatives for the proposed anesthesia with the patient or authorized representative who has indicated his/her understanding and acceptance.     Dental advisory given  Plan Discussed with: CRNA  Anesthesia Plan Comments: ([place] )       Anesthesia Quick Evaluation

## 2021-07-07 NOTE — Progress Notes (Signed)
Sent patient to OR.  Received order to transport to surgery without tele.  Removed tele, dentures and underwear.  Completed checklist as much as possible. Recheck FSBS after 12.5g of D50 and blood sugar was 118. Notified OR of lack of consent on the chart. Patient is alert and oriented x 4.

## 2021-07-07 NOTE — Progress Notes (Signed)
Spoke with dr hollis/mda re: sbp 89 , map 60 , hr 60, orders rec'd for albumin

## 2021-07-07 NOTE — Progress Notes (Signed)
  Daily Progress Note   Subjective: Pt seen and examined in preop holding prior to surgery No complaints  Objective: Vitals:   07/06/21 2100 07/07/21 0931  BP: 124/73 126/77  Pulse: 77 100  Resp: 18 18  Temp:  98.5 F (36.9 C)  SpO2: 100% 100%    Physical Examination Right AKA well healed Left leg without edema  ASSESSMENT/PLAN:  After discussing the risks and benefits of left above knee amputation for nonreconstructable vascular disease and tissue necrosis, Abdelrahman elected to proceed.    Cassandria Santee MD MS [facility] [phone] 07/07/2021  9:57 AM

## 2021-07-08 ENCOUNTER — Encounter (HOSPITAL_COMMUNITY): Payer: Self-pay | Admitting: Vascular Surgery

## 2021-07-08 ENCOUNTER — Ambulatory Visit (HOSPITAL_COMMUNITY): Admission: RE | Admit: 2021-07-08 | Payer: Medicare HMO | Source: Ambulatory Visit

## 2021-07-08 DIAGNOSIS — I96 Gangrene, not elsewhere classified: Secondary | ICD-10-CM | POA: Diagnosis not present

## 2021-07-08 DIAGNOSIS — R401 Stupor: Secondary | ICD-10-CM | POA: Diagnosis not present

## 2021-07-08 DIAGNOSIS — I10 Essential (primary) hypertension: Secondary | ICD-10-CM | POA: Diagnosis not present

## 2021-07-08 LAB — BASIC METABOLIC PANEL
Anion gap: 10 (ref 5–15)
BUN: 13 mg/dL (ref 8–23)
CO2: 21 mmol/L — ABNORMAL LOW (ref 22–32)
Calcium: 7.8 mg/dL — ABNORMAL LOW (ref 8.9–10.3)
Chloride: 113 mmol/L — ABNORMAL HIGH (ref 98–111)
Creatinine, Ser: 1.14 mg/dL (ref 0.61–1.24)
GFR, Estimated: >60 mL/min (ref >60)
Glucose, Bld: 102 mg/dL — ABNORMAL HIGH (ref 70–99)
Potassium: 4.1 mmol/L (ref 3.5–5.1)
Sodium: 144 mmol/L (ref 135–145)

## 2021-07-08 LAB — CBC
HCT: 23.5 % — ABNORMAL LOW (ref 39.0–52.0)
Hemoglobin: 7.2 g/dL — ABNORMAL LOW (ref 13.0–17.0)
MCH: 21.8 pg — ABNORMAL LOW (ref 26.0–34.0)
MCHC: 30.6 g/dL (ref 30.0–36.0)
MCV: 71.2 fL — ABNORMAL LOW (ref 80.0–100.0)
Platelets: 185 10*3/uL (ref 150–400)
RBC: 3.3 MIL/uL — ABNORMAL LOW (ref 4.22–5.81)
RDW: 23.4 % — ABNORMAL HIGH (ref 11.5–15.5)
WBC: 16.4 10*3/uL — ABNORMAL HIGH (ref 4.0–10.5)
nRBC: 0 % (ref 0.0–0.2)

## 2021-07-08 LAB — GLUCOSE, CAPILLARY
Glucose-Capillary: 102 mg/dL — ABNORMAL HIGH (ref 70–99)
Glucose-Capillary: 119 mg/dL — ABNORMAL HIGH (ref 70–99)
Glucose-Capillary: 126 mg/dL — ABNORMAL HIGH (ref 70–99)
Glucose-Capillary: 128 mg/dL — ABNORMAL HIGH (ref 70–99)
Glucose-Capillary: 91 mg/dL (ref 70–99)

## 2021-07-08 MED ORDER — PENICILLIN G POTASSIUM 20000000 UNITS IJ SOLR
8.0000 10*6.[IU] | Freq: Two times a day (BID) | INTRAVENOUS | Status: DC
  Administered 2021-07-09 – 2021-07-14 (×11): 8 10*6.[IU] via INTRAVENOUS
  Filled 2021-07-08 (×17): qty 8

## 2021-07-08 NOTE — Care Management Important Message (Signed)
Important Message  Patient Details  Name: Ryan [name] MRN: [id number] Date of Birth: Jul 17, 1946   Medicare Important Message Given:  Yes     Tichina Koebel Montine Circle 07/08/2021, 3:40 PM

## 2021-07-08 NOTE — Progress Notes (Signed)
Orthopedic Tech Progress Note Patient Details:  Ryan [name] 05-30-1946 [id number]  Called in order to [facility] for a RETENTION SOCK   Patient ID: Ryan [name], male   DOB: [birth year], 75 y.o.   MRN: [id number]  [name] 07/08/2021, 12:10 PM

## 2021-07-08 NOTE — Progress Notes (Signed)
Inpatient Rehab Admissions Coordinator:   CIR consult received. Pt. Is a resident at a SNF, will need to return to SNF for rehab. CIR to sign off.   Clemens Catholic, [place], [place] Admissions Coordinator  [phone] ([place]) [phone] (office)

## 2021-07-08 NOTE — Consult Note (Signed)
WOC Nurse Consult Note: Reason for Consult: sacral wound Wound type: Stage 2 on the left buttock measures 2.3 x 3 x 0.1 and is pink with a red border, purple/maroon on the inferior aspect of the wound.  Scar tissue on the right buttock  Full thickness fissure in the intergluteal cleft that measures 5.8 x 0.4 x 0.2 and is yellow with scant amount of brown drainage.  Pressure Injury POA: Yes Dressing procedure/placement/frequency: Place a Xeroform gauze over the left buttock wound and the fissure in the intergluteal fold, secure with sacral foam dressing placed with the tip up. Change Xeroform daily.   Monitor the wound area(s) for worsening of condition such as: Signs/symptoms of infection, increase in size, development of or worsening of odor, development of pain, or increased pain at the affected locations.   Notify the medical team if any of these develop.  Pressure Injury Prevention Bundle May use any that apply to this patient. Support surfaces (air mattress) chair cushion Kellie Simmering # [phone]) Heel offloading boots Kellie Simmering # [phone]) Turning and Positioning  Measures to reduce shear (draw sheet, knees up) Skin protection Products (Foam dressing) Moisture management products (Critic-Aid Barrier Cream (Purple top) Sween moisturizing lotion (Pink top in clean supply) Nutrition Management Protection for Medical Devices Routine Skin Assessment   Thank you for the consult. [place] nurse will not follow at this time.   Please re-consult the [place] team if needed.  Ryan Marseilles Tamala Julian, MSN, RN, [place], Lysle Pearl, [facility] Wound Treatment Associate Pager [phone]

## 2021-07-08 NOTE — Anesthesia Postprocedure Evaluation (Signed)
Anesthesia Post Note  Patient: Ryan [name]  Procedure(s) Performed: LEFT ABOVE KNEE AMPUTATION (Left: Knee)     Patient location during evaluation: PACU Anesthesia Type: General Level of consciousness: awake and alert Pain management: pain level controlled Vital Signs Assessment: post-procedure vital signs reviewed and stable Respiratory status: spontaneous breathing, nonlabored ventilation, respiratory function stable and patient connected to nasal cannula oxygen Cardiovascular status: blood pressure returned to baseline and stable Postop Assessment: no apparent nausea or vomiting Anesthetic complications: no   No notable events documented.            Effie Berkshire

## 2021-07-08 NOTE — Progress Notes (Signed)
Progress Note    Ryan [name]  [id number] DOB: 04-21-1946  DOA: 07/01/2021 PCP: [name], MD    Brief Narrative:     Medical records reviewed and are as summarized below:  Ryan [name] is an 75 y.o. male with hx of chronic systolic CHF EF [id number], HTN, DM2, CKD stage 3, PVD s/p right AKA, ischemic left leg needing amputation but family refusing, hx of PE on Eliquis, hx of stroke, hx of retroperitoneal hematoma requiring left-sided nephrostomy tube due to urinary obstruction, who Presents to ER from SNF due to reported altered mental status.  He is s/p left AKA.    Assessment/Plan:   Principal Problem:   Altered mental status Active Problems:   Sepsis with acute renal failure without septic shock (HCC)   Chronic systolic heart failure (HCC) - LVEF 25 to 30%   Type 2 diabetes mellitus (HCC)   Essential hypertension   AKI (acute kidney injury) (HCC)   Stage 3 chronic kidney disease (HCC) - baseline SCr 1.1   PAD (peripheral artery disease) ([place])   Pulmonary emboli 04/2021   Nephrostomy status (HCC)   Sepsis/Streptococcus bacteremia -Patient presented with sepsis with acute kidney injury -Likely source of infection is left foot cellulitis, infected wounds -Blood culture growing Streptococcus dysagalactiae started on ceftriaxone -s/p left AKA -will discuss abx with ID to determine length of treatment   Left foot gangrene/peripheral vascular disease -S/p left AKA -Patient has severe peripheral vascular disease -He was recommended to have left BKA by vascular surgery in the past but he was refusing -Now he is agreeable for amputation -Vascular surgery was consulted for above amputation  -Eliquis was changed to full dose Lovenox  -Discussed with vascular surgery, will restart Eliquis on Thursday   Altered mental status -Resolved; likely from above -At baseline   Acute kidney injury -Patient baseline creatinine 1.16, presented with creatinine of 1.79 -He has  nephrostomy tube in place; CT scan shows left nephrostomy tube with no complicating features, resolved hydronephrosis -Avoid nephrotoxic agents -Cannot give IV fluids due to history of underlying CHF -daily labs   Anemia -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  -Amiodarone 200 mg p.o. daily  -Eliquis was changed to Lovenox -Discussed with vascular surgery,  can restart Eliquis on Thursday    Family Communication/Anticipated D/C date and plan/Code Status   DVT prophylaxis: Lovenox ordered. Code Status: Full Code.  Disposition Plan: Status is: Inpatient  Back to SNF in about 48 hours? Needs stable hgb and determination of abx         Medical Consultants:   vascular  Subjective:   No current complaints   Objective:    Vitals:   07/07/21 1249 07/07/21 1310 07/07/21 2100 07/08/21 0200  BP: (!) 108/48 (!) 114/49 (!) 112/49 (!) 123/56  Pulse:  65 77 73  Resp: '15 16 16 16  '$ Temp:  97.9 F (36.6 C) 98.4 F (36.9 C) 99.6  F (37.6 C)  TempSrc:  Oral Oral Axillary  SpO2:  94% 100% 99%  Weight:      Height:        Intake/Output Summary (Last 24 hours) at 07/08/2021 1238 Last data filed at 07/08/2021 1039 Gross per 24 hour  Intake 802.12 ml  Output 375 ml  Net 427.12 ml   Filed Weights   07/04/21 0457 07/07/21 0500 07/07/21 0931  Weight: 60.6 kg 60.3 kg 60.3 kg    Exam:  General: Appearance:     Thin male in no acute distress     Lungs:     respirations unlabored  Heart:    Normal heart rate.    MS:   Left above knee amputation noted. Right above knee amputation noted.   Neurologic:   Awake, alert, cooperative    Data Reviewed:   I have personally reviewed following labs and imaging studies:  Labs: Labs show the following:   Basic Metabolic Panel: Recent Labs  Lab 07/01/21 1625 07/01/21 2034 07/02/21 0219 07/07/21 1118 07/08/21 0253  NA 140 140 139 146* 144  K 4.2 4.4 3.8 3.4* 4.1  CL 107 108 109  --  113*  CO2 '23 24 23  '$ --  21*  GLUCOSE 114* 113* 98  --  102*  BUN 35* 33* 34*  --  13  CREATININE 1.79* 1.60* 1.68*  --  1.14  CALCIUM 8.6* 8.5* 8.5*  --  7.8*  MG  --   --  1.8  --   --    GFR Estimated Creatinine Clearance: 46.3 mL/min (by C-G formula based on SCr of 1.14 mg/dL). Liver Function Tests: Recent Labs  Lab 07/01/21 1625 07/01/21 2034 07/02/21 0219  AST 26 31 40  ALT '11 13 15  '$ ALKPHOS 96 91 84  BILITOT 0.9 1.1 1.5*  PROT 5.9* 6.3* 5.8*  ALBUMIN 2.0* 2.1* 1.9*   No results for input(s): LIPASE, AMYLASE in the last 168 hours. No results for input(s): AMMONIA in the last 168 hours. Coagulation profile Recent Labs  Lab 07/01/21 2034  INR 2.5*    CBC: Recent Labs  Lab 07/01/21 1508 07/02/21 0219 07/07/21 1118 07/08/21 0253  WBC 19.7* 17.4*  --  16.4*  NEUTROABS 18.3* 16.4*  --   --   HGB 10.6* 8.3* 8.8* 7.2*  HCT 35.5* 27.1* 26.0* 23.5*  MCV 73.2* 71.7*  --  71.2*  PLT 236 176  --  185   Cardiac Enzymes: No results for input(s): CKTOTAL, CKMB, CKMBINDEX, TROPONINI in the last 168 hours. BNP (last 3 results) Recent Labs    08/25/20 1121 03/27/21 1123  PROBNP 21,395* 3,146*   CBG: Recent Labs  Lab 07/07/21 1203 07/07/21 1659 07/08/21 0013 07/08/21 0811 07/08/21 1157  GLUCAP 94 83 102* 91 128*   D-Dimer: No results for input(s): DDIMER in the last 72 hours. Hgb A1c: No results for input(s): HGBA1C in the last 72  hours. Lipid Profile: No results for input(s): CHOL, HDL, LDLCALC, TRIG, CHOLHDL, LDLDIRECT in the last 72 hours. Thyroid function studies: No results for input(s): TSH, T4TOTAL, T3FREE, THYROIDAB in the last 72 hours.  Invalid input(s): FREET3 Anemia work up: No results for input(s): VITAMINB12, FOLATE, FERRITIN, TIBC, IRON, RETICCTPCT in the last 72 hours. Sepsis Labs: Recent Labs  Lab 07/01/21 1508 07/01/21 1625 07/01/21 2034 07/02/21 0219 07/03/21 0339 07/08/21 0253  PROCALCITON  --   --   --  5.81 4.60  --   WBC 19.7*  --   --  17.4*  --  16.4*  LATICACIDVEN 1.7 2.0* 1.5 1.0  --   --     Microbiology Recent Results (from the past 240 hour(s))  Urine Culture     Status: Abnormal   Collection Time: 07/01/21  3:05 PM   Specimen: Urine, Catheterized  Result Value Ref Range Status   Specimen Description URINE, CATHETERIZED  Final   Special Requests   Final    NONE Performed at [facility], [address] [place], [place] [address]    Culture >=100,000 COLONIES/mL HAFNIA ALVEI (A)  Final   Report Status 07/06/2021 FINAL  Final   Organism ID, Bacteria HAFNIA ALVEI (A)  Final      Susceptibility   Hafnia alvei - MIC*    AMPICILLIN >=32 RESISTANT Resistant     CEFAZOLIN >=64 RESISTANT Resistant     CEFTRIAXONE >=64 RESISTANT Resistant     CIPROFLOXACIN <=0.25 SENSITIVE Sensitive     GENTAMICIN <=1 SENSITIVE Sensitive     IMIPENEM <=0.25 SENSITIVE Sensitive     NITROFURANTOIN 64 INTERMEDIATE Intermediate     TRIMETH/SULFA <=20 SENSITIVE Sensitive     AMPICILLIN/SULBACTAM >=32 RESISTANT Resistant     PIP/TAZO >=128 RESISTANT Resistant     * >=100,000 COLONIES/mL HAFNIA ALVEI  Blood Culture (routine x 2)     Status: Abnormal   Collection Time: 07/01/21  3:08 PM   Specimen: BLOOD  Result Value Ref Range Status   Specimen Description BLOOD SITE NOT SPECIFIED  Final   Special Requests   Final    BOTTLES DRAWN AEROBIC AND ANAEROBIC Blood Culture adequate volume    Culture  Setup Time   Final    GRAM POSITIVE COCCI IN CHAINS IN BOTH AEROBIC AND ANAEROBIC BOTTLES CRITICAL RESULT CALLED TO, READ BACK BY AND VERIFIED WITH: PHARMD C.AMEND AT [place] 07/02/2021 BY [place]. Performed at [facility], [place] [address] [place], [place] [address]    Culture STREPTOCOCCUS DYSGALACTIAE (A)  Final   Report Status 07/04/2021 FINAL  Final   Organism ID, Bacteria STREPTOCOCCUS DYSGALACTIAE  Final      Susceptibility   Streptococcus dysgalactiae - MIC*    CLINDAMYCIN <=0.25 SENSITIVE Sensitive     AMPICILLIN <=0.25 SENSITIVE Sensitive     ERYTHROMYCIN <=0.12 SENSITIVE Sensitive     VANCOMYCIN 0.5 SENSITIVE Sensitive     CEFTRIAXONE <=0.12 SENSITIVE Sensitive     LEVOFLOXACIN 0.5 SENSITIVE Sensitive     PENICILLIN Value in next row Sensitive      SENSITIVE<=0.06    * STREPTOCOCCUS DYSGALACTIAE  Blood Culture ID Panel (Reflexed)     Status: Abnormal   Collection Time: 07/01/21  3:08 PM  Result Value Ref Range Status   Enterococcus faecalis NOT DETECTED NOT DETECTED Final   Enterococcus Faecium NOT DETECTED NOT DETECTED Final   Listeria monocytogenes NOT DETECTED NOT DETECTED Final   Staphylococcus species NOT DETECTED NOT DETECTED Final   Staphylococcus aureus (BCID) NOT DETECTED NOT DETECTED Final   Staphylococcus epidermidis NOT DETECTED NOT DETECTED Final   Staphylococcus lugdunensis NOT DETECTED NOT DETECTED Final   Streptococcus species DETECTED (A) NOT DETECTED Final    Comment: Not Enterococcus species, Streptococcus agalactiae, Streptococcus pyogenes, or Streptococcus pneumoniae. CRITICAL RESULT CALLED TO, READ BACK BY AND VERIFIED WITH: C AMEND,PHARMD'@0645'$  06/29/21 [place]    Streptococcus agalactiae NOT DETECTED NOT DETECTED Final   Streptococcus pneumoniae NOT DETECTED NOT DETECTED Final   Streptococcus pyogenes NOT DETECTED NOT DETECTED Final   A.calcoaceticus-baumannii NOT DETECTED NOT DETECTED  Final   Bacteroides fragilis NOT DETECTED NOT DETECTED  Final   Enterobacterales NOT DETECTED NOT DETECTED Final   Enterobacter cloacae complex NOT DETECTED NOT DETECTED Final   Escherichia coli NOT DETECTED NOT DETECTED Final   Klebsiella aerogenes NOT DETECTED NOT DETECTED Final   Klebsiella oxytoca NOT DETECTED NOT DETECTED Final   Klebsiella pneumoniae NOT DETECTED NOT DETECTED Final   Proteus species NOT DETECTED NOT DETECTED Final   Salmonella species NOT DETECTED NOT DETECTED Final   Serratia marcescens NOT DETECTED NOT DETECTED Final   Haemophilus influenzae NOT DETECTED NOT DETECTED Final   Neisseria meningitidis NOT DETECTED NOT DETECTED Final   Pseudomonas aeruginosa NOT DETECTED NOT DETECTED Final   Stenotrophomonas maltophilia NOT DETECTED NOT DETECTED Final   Candida albicans NOT DETECTED NOT DETECTED Final   Candida auris NOT DETECTED NOT DETECTED Final   Candida glabrata NOT DETECTED NOT DETECTED Final   Candida krusei NOT DETECTED NOT DETECTED Final   Candida parapsilosis NOT DETECTED NOT DETECTED Final   Candida tropicalis NOT DETECTED NOT DETECTED Final   Cryptococcus neoformans/gattii NOT DETECTED NOT DETECTED Final    Comment: Performed at [facility], [place] [address] [place], [place] [address]  Resp Panel by RT-PCR (Flu A&B, Covid) Nasopharyngeal Swab     Status: None   Collection Time: 07/01/21  3:15 PM   Specimen: Nasopharyngeal Swab; Nasopharyngeal(NP) swabs in vial transport medium  Result Value Ref Range Status   SARS Coronavirus 2 by RT PCR NEGATIVE NEGATIVE Final    Comment: (NOTE) SARS-CoV-2 target nucleic acids are NOT DETECTED.  The SARS-CoV-2 RNA is generally detectable in upper respiratory specimens during the acute phase of infection. The lowest concentration of SARS-CoV-2 viral copies this assay can detect is 138 copies/mL. A negative result does not preclude SARS-Cov-2 infection and should not be used as the sole basis for treatment or other patient management decisions. A negative result may  occur with  improper specimen collection/handling, submission of specimen other than nasopharyngeal swab, presence of viral mutation(s) within the areas targeted by this assay, and inadequate number of viral copies(<138 copies/mL). A negative result must be combined with clinical observations, patient history, and epidemiological information. The expected result is Negative.  Fact Sheet for Patients:  EntrepreneurPulse.com.au  Fact Sheet for Healthcare Providers:  IncredibleEmployment.be  This test is no t yet approved or cleared by the [place] FDA and  has been authorized for detection and/or diagnosis of SARS-CoV-2 by FDA under an Emergency Use Authorization (EUA). This EUA will remain  in effect (meaning this test can be used) for the duration of the COVID-19 declaration under Section 564(b)(1) of the Act, 21 U.S.C.section 360bbb-3(b)(1), unless the authorization is terminated  or revoked sooner.       Influenza A by PCR NEGATIVE NEGATIVE Final   Influenza B by PCR NEGATIVE NEGATIVE Final    Comment: (NOTE) The Xpert Xpress SARS-CoV-2/FLU/RSV plus assay is intended as an aid in the diagnosis of influenza from Nasopharyngeal swab specimens and should not be used as a sole basis for treatment. Nasal washings and aspirates are unacceptable for Xpert Xpress SARS-CoV-2/FLU/RSV testing.  Fact Sheet for Patients: EntrepreneurPulse.com.au  Fact Sheet for Healthcare Providers: IncredibleEmployment.be  This test is not yet approved or cleared by the [place] FDA and has been authorized for detection and/or diagnosis of SARS-CoV-2 by FDA under an Emergency Use Authorization (EUA). This EUA will remain in effect (meaning this test can be used) for the duration of the  COVID-19 declaration under Section 564(b)(1) of the Act, 21 U.S.C. section 360bbb-3(b)(1), unless the authorization is terminated  or revoked.  Performed at [facility], [place] [address], [place], [place] [address]   Blood Culture (routine x 2)     Status: Abnormal   Collection Time: 07/01/21  3:40 PM   Specimen: BLOOD LEFT ARM  Result Value Ref Range Status   Specimen Description BLOOD LEFT ARM  Final   Special Requests   Final    BOTTLES DRAWN AEROBIC AND ANAEROBIC Blood Culture adequate volume   Culture  Setup Time   Final    GRAM POSITIVE COCCI IN CHAINS IN BOTH AEROBIC AND ANAEROBIC BOTTLES CRITICAL VALUE NOTED.  VALUE IS CONSISTENT WITH PREVIOUSLY REPORTED AND CALLED VALUE.    Culture (A)  Final    STREPTOCOCCUS DYSGALACTIAE SUSCEPTIBILITIES PERFORMED ON PREVIOUS CULTURE WITHIN THE LAST 5 DAYS. Performed at [facility], [place] [address] [place], [place] [address]    Report Status 07/04/2021 FINAL  Final    Procedures and diagnostic studies:  No results found.  Medications:    amiodarone  200 mg Oral Daily   insulin aspart  0-9 Units Subcutaneous Q6H   midodrine  10 mg Oral TID WC   Continuous Infusions:  metronidazole 500 mg (07/08/21 0846)   [START ON 07/09/2021] penicillin g continuous IV infusion       LOS: 7 days   Geradine Girt  [facility]   How to contact the [facility] Attending or Consulting provider [place] or covering provider during after hours [place], for this patient?  Check the care team in [facility] and look for a) attending/consulting TRH provider listed and b) the [facility] team listed Log into [email or web address] and use [place]'s universal password to access. If you do not have the password, please contact the hospital operator. Locate the [facility] provider you are looking for under [facility] and page to a number that you can be directly reached. If you still have difficulty reaching the provider, please page the [facility] (Director on Call) for the Hospitalists listed on amion for assistance.  07/08/2021, 12:38 PM

## 2021-07-08 NOTE — Progress Notes (Signed)
Foley catheter removed this morning at 10:30. By 1400 no urine output and no urge to void. Bladder scan at that time showed 34m. MD notified and encouraging PO intake and repeat bladder scan later in day. Educated patient on plan and patient verbalized understanding/agreement of plan. By 1800 nor urine output and no urge to void. Bladder scan at that time showed 626m MD notified and aware.

## 2021-07-08 NOTE — Progress Notes (Signed)
Physical Therapy Re-evaluation Patient Details Name: Ryan [name] MRN: [id number] DOB: Mar 28, 1946 Today's Date: 07/08/2021   History of Present Illness Pt is a 75 year old male admitted 10/12 with AMS, ischemic LLE with gangrene L foot. 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 PMH of chronic systolic CHF, EF 123456, hypertension, diabetes mellitus type 2, CKD stage III, PVD s/p R AKA (04/2021), h/o pulmonary embolism on Eliquis, h/o CVA.    PT Comments    Pt presents to PT s/p L AKA. PT updates goals and plan of care according to pt's current functional level. Pt currently requires physical assistance to perform bed mobility and to laterally transfer out of bed. Pt demonstrates good activity tolerance and motivation to participate in PT session. Prior to recent bilateral AKAs pt was independent, living alone and ambulating. Pt demonstrates good potential to return to a modI level of wheelchair mobility. PT recommends CIR placement as the pt plans to eventually discharge to son and daughter-in-laws home in [place], reporting 24/7 assistance from family.   Recommendations for follow up therapy are one component of a multi-disciplinary discharge planning process, led by the attending physician.  Recommendations may be updated based on patient status, additional functional criteria and insurance authorization.  Follow Up Recommendations  CIR     Equipment Recommendations  Wheelchair (measurements PT);Wheelchair cushion (measurements PT);Other (comment) (sliding board)    Recommendations for Other Services Rehab consult     Precautions / Restrictions Precautions Precautions: Fall;Other (comment) Precaution Comments: nephrostomy bag Restrictions Weight Bearing Restrictions: Yes LLE Weight Bearing: Non weight bearing     Mobility  Bed Mobility Overal bed mobility: Needs Assistance Bed Mobility: Rolling;Sidelying to Sit Rolling: Min guard Sidelying to sit: Mod assist             Transfers Overall transfer level: Needs assistance Equipment used:  (bed pad) Transfers: Lateral/Scoot Transfers          Lateral/Scoot Transfers: Max assist General transfer comment: attempted to initiate with sliding board however PT removes sliding board and utilizes bed pad to complete transfer to drop arm recliner. Pt requiring cues for anterior trunk lean to offload buttocks  Ambulation/Gait                 Stairs             Wheelchair Mobility    Modified Rankin (Stroke Patients Only)       Balance Overall balance assessment: Needs assistance Sitting-balance support: Single extremity supported;Bilateral upper extremity supported;Feet unsupported Sitting balance-Leahy Scale: Poor Sitting balance - Comments: minA with UE support of bed Postural control: Posterior lean                                  Cognition Arousal/Alertness: Awake/alert Behavior During Therapy: WFL for tasks assessed/performed Overall Cognitive Status: Impaired/Different from baseline Area of Impairment: Problem solving;Awareness                           Awareness: Emergent Problem Solving: Slow processing;Requires verbal cues;Requires tactile cues        Exercises      General Comments General comments (skin integrity, edema, etc.): VSS on RA, L nephrostomy drain      Pertinent Vitals/Pain Pain Assessment: No/denies pain    Home Living  Prior Function            PT Goals (current goals can now be found in the care plan section) Acute Rehab PT Goals Patient Stated Goal: decrease pain PT Goal Formulation: With patient Time For Goal Achievement: 07/22/21 Potential to Achieve Goals: Good Progress towards PT goals: Progressing toward goals (goals reassessed s/p L AKA)    Frequency    Min 3X/week      PT Plan Discharge plan needs to be updated    Co-evaluation              AM-PAC PT "6  Clicks" Mobility   Outcome Measure  Help needed turning from your back to your side while in a flat bed without using bedrails?: A Little Help needed moving from lying on your back to sitting on the side of a flat bed without using bedrails?: A Lot Help needed moving to and from a bed to a chair (including a wheelchair)?: A Lot Help needed standing up from a chair using your arms (e.g., wheelchair or bedside chair)?: Total Help needed to walk in hospital room?: Total Help needed climbing 3-5 steps with a railing? : Total 6 Click Score: 10    End of Session   Activity Tolerance: Patient tolerated treatment well Patient left: in chair;with call bell/phone within reach;with chair alarm set Nurse Communication: Mobility status PT Visit Diagnosis: Other abnormalities of gait and mobility (R26.89);Pain Pain - Right/Left: Left Pain - part of body: Ankle and joints of foot     Time: [id number] PT Time Calculation (min) (ACUTE ONLY): 33 min  Charges:  $1 Re-eval                    [name], PT, DPT Acute Rehabilitation Pager: [phone] Office [place] 07/08/2021, 3:49 PM

## 2021-07-08 NOTE — Progress Notes (Signed)
  Daily Progress Note  POD 1 Day Post-Op Left AKA  Subjective:  Patient doing well this morning, pain well controlled.  No complaints.  Objective: Vitals:   07/07/21 2100 07/08/21 0200  BP: (!) 112/49 (!) 123/56  Pulse: 77 73  Resp: 16 16  Temp: 98.4 F (36.9 C) 99.6 F (37.6 C)  SpO2: 100% 99%    Physical Examination Right AKA well healed Left leg without edema, no strikethrough on dressing, stump soft  ASSESSMENT/PLAN:  Patient doing well status post left above-knee amputation. Will take down the dressing tomorrow No concern for hematoma at this time, stump remains soft Will discuss restarting anticoagulation after dressing change tomorrow morning. Will order [place] and stump socks PT OT today, up out of bed, patient would benefit from trapeze for mobility.  Appreciate excellent care by primary team and nursing, please reach out with any concerns.    Cassandria Santee MD [facility] [phone] 07/08/2021  7:12 AM

## 2021-07-09 DIAGNOSIS — N179 Acute kidney failure, unspecified: Secondary | ICD-10-CM

## 2021-07-09 DIAGNOSIS — I96 Gangrene, not elsewhere classified: Secondary | ICD-10-CM

## 2021-07-09 DIAGNOSIS — N183 Chronic kidney disease, stage 3 unspecified: Secondary | ICD-10-CM

## 2021-07-09 DIAGNOSIS — A408 Other streptococcal sepsis: Secondary | ICD-10-CM

## 2021-07-09 DIAGNOSIS — E1159 Type 2 diabetes mellitus with other circulatory complications: Secondary | ICD-10-CM

## 2021-07-09 DIAGNOSIS — R401 Stupor: Secondary | ICD-10-CM

## 2021-07-09 DIAGNOSIS — R6520 Severe sepsis without septic shock: Secondary | ICD-10-CM

## 2021-07-09 DIAGNOSIS — S78112A Complete traumatic amputation at level between left hip and knee, initial encounter: Secondary | ICD-10-CM

## 2021-07-09 LAB — GLUCOSE, CAPILLARY
Glucose-Capillary: 103 mg/dL — ABNORMAL HIGH (ref 70–99)
Glucose-Capillary: 91 mg/dL (ref 70–99)
Glucose-Capillary: 83 mg/dL (ref 70–99)
Glucose-Capillary: 98 mg/dL (ref 70–99)
Glucose-Capillary: 104 mg/dL — ABNORMAL HIGH (ref 70–99)
Glucose-Capillary: 124 mg/dL — ABNORMAL HIGH (ref 70–99)
Glucose-Capillary: 117 mg/dL — ABNORMAL HIGH (ref 70–99)

## 2021-07-09 LAB — PREPARE RBC (CROSSMATCH)

## 2021-07-09 LAB — BASIC METABOLIC PANEL
Anion gap: 7 (ref 5–15)
BUN: 13 mg/dL (ref 8–23)
CO2: 23 mmol/L (ref 22–32)
Calcium: 7.9 mg/dL — ABNORMAL LOW (ref 8.9–10.3)
Chloride: 114 mmol/L — ABNORMAL HIGH (ref 98–111)
Creatinine, Ser: 1.16 mg/dL (ref 0.61–1.24)
GFR, Estimated: >60 mL/min (ref >60)
Glucose, Bld: 100 mg/dL — ABNORMAL HIGH (ref 70–99)
Potassium: 3.1 mmol/L — ABNORMAL LOW (ref 3.5–5.1)
Sodium: 144 mmol/L (ref 135–145)

## 2021-07-09 LAB — CBC
HCT: 21.7 % — ABNORMAL LOW (ref 39.0–52.0)
Hemoglobin: 6.7 g/dL — CL (ref 13.0–17.0)
MCH: 21.6 pg — ABNORMAL LOW (ref 26.0–34.0)
MCHC: 30.9 g/dL (ref 30.0–36.0)
MCV: 70 fL — ABNORMAL LOW (ref 80.0–100.0)
Platelets: 203 10*3/uL (ref 150–400)
RBC: 3.1 MIL/uL — ABNORMAL LOW (ref 4.22–5.81)
RDW: 23.2 % — ABNORMAL HIGH (ref 11.5–15.5)
WBC: 12.4 10*3/uL — ABNORMAL HIGH (ref 4.0–10.5)
nRBC: 0 % (ref 0.0–0.2)

## 2021-07-09 MED ORDER — POTASSIUM CHLORIDE CRYS ER 20 MEQ PO TBCR
40.0000 meq | EXTENDED_RELEASE_TABLET | Freq: Once | ORAL | Status: AC
  Administered 2021-07-09: 40 meq via ORAL
  Filled 2021-07-09: qty 2

## 2021-07-09 MED ORDER — SODIUM CHLORIDE 0.9% IV SOLUTION
Freq: Once | INTRAVENOUS | Status: AC
  Administered 2021-07-09: 10 mL/h via INTRAVENOUS

## 2021-07-09 NOTE — Plan of Care (Signed)

## 2021-07-09 NOTE — Progress Notes (Signed)
Cross-coverage note:   Hgb 6.7 this morning. Plan to transfuse 1 unit RBC.

## 2021-07-09 NOTE — Progress Notes (Signed)
Progress Note    Ryan [name]  [id number] DOB: 16-Feb-1946  DOA: 07/01/2021 PCP: [name], MD    Brief Narrative:     Medical records reviewed and are as summarized below:  Ryan [name] is an 75 y.o. male with hx of chronic systolic CHF EF 123456, HTN, DM2, CKD stage 3, PVD s/p right AKA, ischemic left leg needing amputation but family refusing, hx of PE on Eliquis, hx of stroke, hx of retroperitoneal hematoma requiring left-sided nephrostomy tube due to urinary obstruction, who Presents to ER from SNF due to reported altered mental status.  He is s/p left AKA.     Assessment/Plan:   Principal Problem:   Altered mental status Active Problems:   Sepsis with acute renal failure without septic shock (HCC)   Chronic systolic heart failure (HCC) - LVEF 25 to 30%   Type 2 diabetes mellitus (HCC)   Essential hypertension   AKI (acute kidney injury) (HCC)   Stage 3 chronic kidney disease (HCC) - baseline SCr 1.1   PAD (peripheral artery disease) ([place])   Pulmonary emboli 04/2021   Nephrostomy status (HCC)   Sepsis/Streptococcus bacteremia -Patient presented with sepsis with acute kidney injury -Likely source of infection is left foot cellulitis, infected wounds -Blood culture growing Streptococcus dysagalactiae started on ceftriaxone -s/p left AKA -will discuss abx with ID to determine length of treatment   Left foot gangrene/peripheral vascular disease -S/p left AKA -Patient has severe peripheral vascular disease -He was recommended to have left BKA by vascular surgery in the past but he was refusing -Now he is agreeable for amputation -Vascular surgery was consulted for above amputation  -Eliquis was changed to full dose Lovenox  -Discussed with vascular surgery, will restart Eliquis on Thursday   Altered mental status -Resolved; likely from above -At baseline   Acute kidney injury -Patient baseline creatinine 1.16, presented with creatinine of 1.79 -He  has nephrostomy tube in place; CT scan shows left nephrostomy tube with no complicating features, resolved hydronephrosis -Avoid nephrotoxic agents -Cannot give IV fluids due to history of underlying CHF -daily labs   Anemia -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  -Amiodarone 200 mg p.o. daily  -Eliquis was changed to Lovenox -Discussed with vascular surgery,  can restart Eliquis on Thursday (will restart once hgb stable)  Hypokalemia -replete   Family Communication/Anticipated D/C date and plan/Code Status   DVT prophylaxis: Lovenox ordered. Code Status: Full Code.  Disposition Plan: Status is: Inpatient  Back to SNF in about 48 hours? Needs stable hgb and determination of abx         Medical Consultants:   Vascular ID  Subjective:   Asking about being d/c'd  Objective:    Vitals:   07/09/21 0654 07/09/21 0715 07/09/21 0800 07/09/21 1043  BP: (!) 115/51 (!) 121/55 (!) 116/43 (!) 120/55  Pulse: 73 87 72 76  Resp: '19 20 20   '$ Temp: 99 F (37.2 C) 99.2  F (37.3 C) 98.1 F (36.7 C) 98.4 F (36.9 C)  TempSrc: Axillary Axillary Oral Oral  SpO2: 97% 98% 98% 99%  Weight:      Height:        Intake/Output Summary (Last 24 hours) at 07/09/2021 1239 Last data filed at 07/09/2021 1034 Gross per 24 hour  Intake 392.33 ml  Output 50 ml  Net 342.33 ml   Filed Weights   07/04/21 0457 07/07/21 0500 07/07/21 0931  Weight: 60.6 kg  60.3 kg 60.3 kg    Exam:   General: Appearance:    Thin male in no acute distress     Lungs:    respirations unlabored  Heart:    Normal heart rate.    MS:   Left above knee amputation noted. Right above knee amputation noted.   Neurologic:   Awake, alert      Data Reviewed:   I have personally reviewed following labs and imaging studies:  Labs: Labs show the following:   Basic Metabolic Panel: Recent Labs  Lab 07/07/21 1118 07/08/21 0253 07/09/21 0353  NA 146* 144 144  K 3.4* 4.1 3.1*  CL  --  113* 114*  CO2  --  21* 23  GLUCOSE  --  102* 100*  BUN  --  13 13  CREATININE  --  1.14 1.16  CALCIUM  --  7.8* 7.9*   GFR Estimated Creatinine Clearance: 45.5 mL/min (by C-G formula based on SCr of 1.16 mg/dL). Liver Function Tests: No results for input(s): AST, ALT, ALKPHOS, BILITOT, PROT, ALBUMIN in the last 168 hours.  No results for input(s): LIPASE, AMYLASE in the last 168 hours. No results for input(s): AMMONIA in the last 168 hours. Coagulation profile No results for input(s): INR, PROTIME in the last 168 hours.   CBC: Recent Labs  Lab 07/07/21 1118 07/08/21 0253 07/09/21 0353  WBC  --  16.4* 12.4*  HGB 8.8* 7.2* 6.7*  HCT 26.0* 23.5* 21.7*  MCV  --  71.2* 70.0*  PLT  --  185 203   Cardiac Enzymes: No results for input(s): CKTOTAL, CKMB, CKMBINDEX, TROPONINI in the last 168 hours. BNP (last 3 results) Recent Labs    08/25/20 1121 03/27/21 1123  PROBNP 21,395* 3,146*   CBG: Recent Labs  Lab 07/08/21 1930 07/09/21 0150 07/09/21 0546 07/09/21 0737 07/09/21 1140  GLUCAP 126* 103* 91 83 98   D-Dimer: No results for input(s): DDIMER in the last 72 hours. Hgb A1c: No results for input(s): HGBA1C in the last 72 hours. Lipid Profile: No results for input(s): CHOL, HDL, LDLCALC, TRIG, CHOLHDL, LDLDIRECT in the last 72 hours. Thyroid function studies: No results for input(s): TSH, T4TOTAL, T3FREE, THYROIDAB in the last 72 hours.  Invalid  input(s): FREET3 Anemia work up: No results for input(s): VITAMINB12, FOLATE, FERRITIN, TIBC, IRON, RETICCTPCT in the last 72 hours. Sepsis Labs: Recent Labs  Lab 07/03/21 0339 07/08/21 0253 07/09/21 0353  PROCALCITON 4.60  --   --   WBC  --  16.4* 12.4*    Microbiology Recent Results (from the past 240 hour(s))  Urine Culture     Status: Abnormal   Collection Time: 07/01/21  3:05 PM   Specimen: Urine, Catheterized  Result Value Ref Range Status   Specimen Description URINE, CATHETERIZED  Final   Special Requests   Final    NONE Performed at [facility], [address] [place], [place] [address]    Culture >=100,000 COLONIES/mL HAFNIA ALVEI (A)  Final   Report Status 07/06/2021 FINAL  Final   Organism ID, Bacteria HAFNIA ALVEI (A)  Final      Susceptibility   Hafnia alvei - MIC*    AMPICILLIN >=32 RESISTANT Resistant     CEFAZOLIN >=64 RESISTANT Resistant     CEFTRIAXONE >=64 RESISTANT Resistant     CIPROFLOXACIN <=0.25 SENSITIVE Sensitive     GENTAMICIN <=1 SENSITIVE Sensitive     IMIPENEM <=0.25 SENSITIVE Sensitive     NITROFURANTOIN 64 INTERMEDIATE Intermediate     TRIMETH/SULFA <=20 SENSITIVE Sensitive     AMPICILLIN/SULBACTAM >=32 RESISTANT Resistant     PIP/TAZO >=128 RESISTANT Resistant     * >=100,000 COLONIES/mL HAFNIA ALVEI  Blood Culture (routine x 2)     Status: Abnormal   Collection Time: 07/01/21  3:08 PM   Specimen: BLOOD  Result Value Ref Range Status   Specimen Description BLOOD SITE NOT SPECIFIED  Final   Special Requests   Final    BOTTLES DRAWN AEROBIC AND ANAEROBIC Blood Culture adequate volume   Culture  Setup Time   Final    GRAM POSITIVE COCCI IN CHAINS IN BOTH AEROBIC AND ANAEROBIC BOTTLES CRITICAL RESULT CALLED TO, READ BACK BY AND VERIFIED WITH: PHARMD C.AMEND AT [place] 07/02/2021 BY [place]. Performed at [facility], [place] [address], [place], [place] [address]    Culture STREPTOCOCCUS DYSGALACTIAE (A)  Final   Report Status  07/04/2021 FINAL  Final   Organism ID, Bacteria STREPTOCOCCUS DYSGALACTIAE  Final      Susceptibility   Streptococcus dysgalactiae - MIC*    CLINDAMYCIN <=0.25 SENSITIVE Sensitive     AMPICILLIN <=0.25 SENSITIVE Sensitive     ERYTHROMYCIN <=0.12 SENSITIVE Sensitive     VANCOMYCIN 0.5 SENSITIVE Sensitive     CEFTRIAXONE <=0.12 SENSITIVE Sensitive     LEVOFLOXACIN 0.5 SENSITIVE Sensitive     PENICILLIN Value in next row Sensitive      SENSITIVE<=0.06    * STREPTOCOCCUS DYSGALACTIAE  Blood Culture ID Panel (Reflexed)     Status: Abnormal   Collection Time: 07/01/21  3:08 PM  Result Value Ref Range Status   Enterococcus faecalis NOT DETECTED NOT DETECTED Final   Enterococcus Faecium NOT DETECTED NOT DETECTED Final   Listeria monocytogenes NOT DETECTED NOT DETECTED Final   Staphylococcus species NOT DETECTED NOT DETECTED Final   Staphylococcus aureus (BCID) NOT DETECTED NOT DETECTED Final   Staphylococcus epidermidis NOT DETECTED NOT DETECTED Final   Staphylococcus lugdunensis NOT DETECTED NOT DETECTED Final   Streptococcus species DETECTED (A) NOT DETECTED Final    Comment: Not Enterococcus species, Streptococcus agalactiae, Streptococcus pyogenes, or Streptococcus pneumoniae. CRITICAL RESULT CALLED TO, READ BACK BY AND VERIFIED WITH: C AMEND,PHARMD'@0645'$  06/29/21 [place]    Streptococcus agalactiae NOT DETECTED NOT DETECTED Final   Streptococcus pneumoniae NOT DETECTED NOT DETECTED Final   Streptococcus pyogenes NOT DETECTED NOT DETECTED Final   A.calcoaceticus-baumannii NOT DETECTED NOT DETECTED Final   Bacteroides fragilis NOT DETECTED NOT DETECTED Final   Enterobacterales NOT DETECTED NOT DETECTED Final   Enterobacter cloacae complex NOT DETECTED NOT DETECTED Final   Escherichia coli NOT DETECTED NOT DETECTED Final   Klebsiella aerogenes NOT DETECTED NOT DETECTED Final   Klebsiella oxytoca NOT DETECTED NOT DETECTED Final   Klebsiella pneumoniae NOT DETECTED NOT DETECTED Final    Proteus species NOT DETECTED NOT DETECTED Final   Salmonella species NOT DETECTED NOT DETECTED Final   Serratia marcescens NOT DETECTED NOT DETECTED Final   Haemophilus influenzae NOT DETECTED  NOT DETECTED Final   Neisseria meningitidis NOT DETECTED NOT DETECTED Final   Pseudomonas aeruginosa NOT DETECTED NOT DETECTED Final   Stenotrophomonas maltophilia NOT DETECTED NOT DETECTED Final   Candida albicans NOT DETECTED NOT DETECTED Final   Candida auris NOT DETECTED NOT DETECTED Final   Candida glabrata NOT DETECTED NOT DETECTED Final   Candida krusei NOT DETECTED NOT DETECTED Final   Candida parapsilosis NOT DETECTED NOT DETECTED Final   Candida tropicalis NOT DETECTED NOT DETECTED Final   Cryptococcus neoformans/gattii NOT DETECTED NOT DETECTED Final    Comment: Performed at [facility], [place] [address], [place], [place] [address]  Resp Panel by RT-PCR (Flu A&B, Covid) Nasopharyngeal Swab     Status: None   Collection Time: 07/01/21  3:15 PM   Specimen: Nasopharyngeal Swab; Nasopharyngeal(NP) swabs in vial transport medium  Result Value Ref Range Status   SARS Coronavirus 2 by RT PCR NEGATIVE NEGATIVE Final    Comment: (NOTE) SARS-CoV-2 target nucleic acids are NOT DETECTED.  The SARS-CoV-2 RNA is generally detectable in upper respiratory specimens during the acute phase of infection. The lowest concentration of SARS-CoV-2 viral copies this assay can detect is 138 copies/mL. A negative result does not preclude SARS-Cov-2 infection and should not be used as the sole basis for treatment or other patient management decisions. A negative result may occur with  improper specimen collection/handling, submission of specimen other than nasopharyngeal swab, presence of viral mutation(s) within the areas targeted by this assay, and inadequate number of viral copies(<138 copies/mL). A negative result must be combined with clinical observations, patient history, and  epidemiological information. The expected result is Negative.  Fact Sheet for Patients:  EntrepreneurPulse.com.au  Fact Sheet for Healthcare Providers:  IncredibleEmployment.be  This test is no t yet approved or cleared by the [place] FDA and  has been authorized for detection and/or diagnosis of SARS-CoV-2 by FDA under an Emergency Use Authorization (EUA). This EUA will remain  in effect (meaning this test can be used) for the duration of the COVID-19 declaration under Section 564(b)(1) of the Act, 21 U.S.C.section 360bbb-3(b)(1), unless the authorization is terminated  or revoked sooner.       Influenza A by PCR NEGATIVE NEGATIVE Final   Influenza B by PCR NEGATIVE NEGATIVE Final    Comment: (NOTE) The Xpert Xpress SARS-CoV-2/FLU/RSV plus assay is intended as an aid in the diagnosis of influenza from Nasopharyngeal swab specimens and should not be used as a sole basis for treatment. Nasal washings and aspirates are unacceptable for Xpert Xpress SARS-CoV-2/FLU/RSV testing.  Fact Sheet for Patients: EntrepreneurPulse.com.au  Fact Sheet for Healthcare Providers: IncredibleEmployment.be  This test is not yet approved or cleared by the [place] FDA and has been authorized for detection and/or diagnosis of SARS-CoV-2 by FDA under an Emergency Use Authorization (EUA). This EUA will remain in effect (meaning this test can be used) for the duration of the COVID-19 declaration under Section 564(b)(1) of the Act, 21 U.S.C. section 360bbb-3(b)(1), unless the authorization is terminated or revoked.  Performed at [facility], [place] [address], [place], [place] [address]   Blood Culture (routine x 2)     Status: Abnormal   Collection Time: 07/01/21  3:40 PM   Specimen: BLOOD LEFT ARM  Result Value Ref Range Status   Specimen Description BLOOD LEFT ARM  Final   Special Requests   Final     BOTTLES DRAWN AEROBIC AND ANAEROBIC Blood Culture adequate volume   Culture  Setup Time   Final    GRAM POSITIVE COCCI IN CHAINS IN BOTH AEROBIC AND ANAEROBIC BOTTLES CRITICAL VALUE NOTED.  VALUE IS CONSISTENT WITH PREVIOUSLY REPORTED AND CALLED VALUE.    Culture (A)  Final    STREPTOCOCCUS DYSGALACTIAE SUSCEPTIBILITIES PERFORMED ON PREVIOUS CULTURE WITHIN THE LAST 5 DAYS. Performed at [facility], [place] [address] [place], [place] [address]    Report Status 07/04/2021 FINAL  Final    Procedures and diagnostic studies:  No results found.  Medications:    amiodarone  200 mg Oral Daily   insulin aspart  0-9 Units Subcutaneous Q6H   midodrine  10 mg Oral TID WC   Continuous Infusions:  penicillin g continuous IV infusion       LOS: 8 days   Geradine Girt  [facility]   How to contact the [facility] Attending or Consulting provider [place] or covering provider during after hours [place], for this patient?  Check the care team in [facility] and look for a) attending/consulting [facility] provider listed and b) the [facility] team listed Log into [email or web address] and use [place]'s universal password to access. If you do not have the password, please contact the hospital operator. Locate the [facility] provider you are looking for under [facility] and page to a number that you can be directly reached. If you still have difficulty reaching the provider, please page the [facility] (Director on Call) for the Hospitalists listed on amion for assistance.  07/09/2021, 12:39 PM

## 2021-07-09 NOTE — TOC Initial Note (Signed)
Transition of Care [facility]) - Initial/Assessment Note    Patient Details  Name: Ryan [name] MRN: [id number] Date of Birth: 26-Feb-1946  [facility]) CM/SW Contact:    Angelita Ingles, RN Phone [phone]  07/09/2021, 3:06 PM  Clinical Narrative:                 [facility] consulted for patient from SNF. CM at bedside to assess patient. Patient is agreeable that he will return to [facility]. Patient states that he has a daughter Otila Kluver and a daughter in law [name] that both keep up with his needs. Patient states that Otila Kluver has moved from [place] to live in his house with him. Patient states that he does have pcp and access to medications and appointments.  1510 FL2 updated, patient is not managed through navi so facility will need to initiate auth. Message has been sent to [facility] with [facility].   Expected Discharge Plan: Skilled Nursing Facility Barriers to Discharge: Continued Medical Work up   Patient Goals and CMS Choice Patient states their goals for this hospitalization and ongoing recovery are:: Patient states that he just wants to get better   Choice offered to / list presented to : NA  Expected Discharge Plan and Services Expected Discharge Plan: [place] In-house Referral: NA Discharge Planning Services: CM Consult Post Acute Care Choice: NA Living arrangements for the past 2 months: Single Family Home                 DME Arranged: N/A DME Agency: NA       HH Arranged: NA HH Agency: NA        Prior Living Arrangements/Services Living arrangements for the past 2 months: Single Family Home Lives with:: Adult Children Patient language and need for interpreter reviewed:: Yes Do you feel safe going back to the place where you live?: Yes      Need for Family Participation in Patient Care: Yes (Comment) Care giver support system in place?: Yes (comment)   Criminal Activity/Legal Involvement Pertinent to Current  Situation/Hospitalization: No - Comment as needed  Activities of Daily Living      Permission Sought/Granted Permission sought to share information with : Facility Art therapist granted to share information with : Yes, Verbal Permission Granted  Share Information with NAME: [name] & Otila Kluver     Permission granted to share info w Relationship: daughter and daughter in law     Emotional Assessment Appearance:: Appears stated age Attitude/Demeanor/Rapport: Gracious Affect (typically observed): Hopeful Orientation: : Oriented to Self, Oriented to Place, Oriented to  Time, Oriented to Situation Alcohol / Substance Use: Not Applicable Psych Involvement: No (comment)  Admission diagnosis:  Metabolic encephalopathy 99991111 Altered mental status [R41.82] Gangrene of left foot (HCC) [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] Sepsis, due to unspecified organism, unspecified whether acute organ dysfunction present [facility]) [A41.9] Patient Active Problem List   Diagnosis Date Noted   Altered mental status 07/01/2021   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   Multiple pulmonary nodules [id number]   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[place]) 06/06/2021   Normocytic anemia 06/06/2021   DVT of left external iliac vein 04/2021 06/06/2021   Pulmonary emboli 04/2021 06/06/2021   Pressure injury of skin 04/22/2021   Popliteal artery occlusion, 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) 09/16/2015   AKI (acute kidney injury) ([place])  08/30/2015   SVT (supraventricular tachycardia)  long RP    Atherosclerosis of native arteries of the extremities with ulceration ([place]) 10/23/2012   Essential hypertension 09/29/2012    Class: Chronic   Type 2 diabetes mellitus ([place]) 09/28/2012   Status post mitral valve replacement with bioprosthetic valve 09/19/2012    Class: Acute   Chronic systolic heart failure (HCC) - LVEF 25 to 30% 09/16/2012    Class: Acute   Peripheral vascular disease ([place]) 09/15/2012   Sepsis with acute renal failure without septic shock ([place]) 09/15/2012   Acute ischemic stroke ([place]) 09/15/2012   PCP:  [name], MD Pharmacy:   [facility] [place], [place] - [place] [address] [place] [address] Phone: [phone] Fax: [phone]     Social Determinants of Health (SDOH) Interventions    Readmission Risk Interventions Readmission Risk Prevention Plan 07/09/2021 04/15/2021  Transportation Screening Complete -  PCP or Specialist Appt within 5-7 Days Complete -  Home Care Screening Complete Complete  Medication Review (RN CM) Complete Complete  Some recent data might be hidden

## 2021-07-09 NOTE — Progress Notes (Signed)
This nurse received an order to place midline. After review of chart and discussion with Dr. Johnnye Sima. Midline will be placed when current PIV no longer works or closer to DC home to help ensure the midline will function throughout duration of IV ATB infusion after discharge. Dr. Johnnye Sima agreed to this plan. And [place] RN aware. Fran Lowes, RN VAST

## 2021-07-09 NOTE — Consult Note (Signed)
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 He has had source control.  The complicating factor is the pacer. Hopefully this is not infected. Repeat BCx now and at end of therapy will help to allay this. A TTE will also decrease concern about this.  He did not wake up enough for me to ask him thorough questions.   Thank you so much for this interesting consult,  Principal Problem:   Altered mental status Active Problems:   Sepsis with acute renal failure without septic shock (HCC)   Chronic systolic heart failure (HCC) - LVEF 25 to 30%   Type 2 diabetes mellitus (HCC)   Essential hypertension   AKI (acute kidney injury) (HCC)   Stage 3 chronic kidney disease (HCC) - baseline SCr 1.1   PAD (peripheral artery disease) (HCC)   Pulmonary emboli 04/2021   Nephrostomy status (HCC)    amiodarone  200 mg Oral Daily   insulin aspart  0-9 Units Subcutaneous Q6H   midodrine  10 mg Oral TID WC    HPI: Ryan [name] is a 75 y.o. male with CHF (EF 25%), CKD3, DM, prev adm 05-2021 with L external iliac DVT and PE. He also has L foot ischemia, gangrene. He was brought to ED from SNF with mental status change on 07-01-21. Marland Kitchen  He was noted to have temp 102.3 on adm as well as WBC 19.7, lactate 2.0.  Cr 1.79 (prev 1.1).  His adm BCx grew Strep dysgalactiae in 4/4. His UCx grew [employer] He was started on cefepime, vanco and flagyl. He is now on Penicillin.  He  underwent L AKA on 07-07-21.  His course has been complicated by anemia (6.7 yesterday (transfused), 10.6 on adm).  Cr back down to baseline.   CT stone study- showed improvement of hematoma.   Review of Systems: Review of Systems  Unable to perform ROS: Mental status change   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 06/06/2021   Endocarditis    Hypertension    Peripheral vascular disease ([facility])    Pulmonary emboli 04/2021 06/06/2021   PVC's (premature ventricular contractions)    SVT (supraventricular tachycardia)  long RP     Social History   Tobacco Use   Smoking status: Never   Smokeless tobacco: Never  Vaping Use   Vaping Use: Never used  Substance Use Topics   Alcohol use: No    Alcohol/week: 0.0 standard drinks   Drug use: No  Family History  Problem Relation Age of Onset   Hypertension Mother    Hypertension Father      Medications: Scheduled:  amiodarone  200 mg Oral Daily   insulin aspart  0-9 Units Subcutaneous Q6H   midodrine  10 mg Oral TID WC    Abtx:  Anti-infectives (From admission, onward)    Start     Dose/Rate Route Frequency Ordered Stop   07/09/21 0800  penicillin G potassium 8 Million Units in dextrose 5 % 500 mL continuous infusion        8 Million Units 41.7 mL/hr over 12 Hours Intravenous Every 12 hours 07/08/21 0944     07/07/21 1000  cefUROXime (ZINACEF) 1.5 g in sodium chloride 0.9 % 100 mL IVPB        1.5 g 200 mL/hr over 30 Minutes Intravenous To [facility]Stay [facility] 07/06/21 0655 07/07/21 1049   07/07/21 0945  vancomycin (VANCOCIN) IVPB 1000 mg/200 mL premix  Status:   Discontinued        1,000 mg 200 mL/hr over 60 Minutes Intravenous On call to O.R. 07/07/21 0934 07/07/21 1320   07/07/21 0935  vancomycin (VANCOCIN) 1-5 GM/200ML-% IVPB  Status:  Discontinued       Note to Pharmacy: Roosvelt Maser   : cabinet override      07/07/21 0935 07/07/21 0939   07/02/21 1900  vancomycin (VANCOCIN) IVPB 1000 mg/200 mL premix  Status:  Discontinued        1,000 mg 200 mL/hr over 60 Minutes Intravenous Every 24 hours 07/01/21 1834 07/02/21 0656   07/02/21 1000  ceFEPIme (MAXIPIME) 2 g in sodium chloride 0.9 % 100 mL IVPB  Status:  Discontinued        2 g 200 mL/hr over 30 Minutes Intravenous Every 12 hours 07/01/21 1834 07/02/21 0656   07/02/21 0800  metroNIDAZOLE (FLAGYL) IVPB 500 mg        500 mg 100 mL/hr over 60 Minutes Intravenous Every 12 hours 07/01/21 2342 07/08/21 2258   07/02/21 0800  cefTRIAXone (ROCEPHIN) 2 g in sodium chloride 0.9 % 100 mL IVPB        2 g 200 mL/hr over 30 Minutes Intravenous Every 24 hours 07/02/21 0656 07/08/21 0833   07/01/21 1545  ceFEPIme (MAXIPIME) 2 g in sodium chloride 0.9 % 100 mL IVPB        2 g 200 mL/hr over 30 Minutes Intravenous  Once 07/01/21 1537 07/01/21 1756   07/01/21 1545  vancomycin (VANCOREADY) IVPB 1500 mg/300 mL        1,500 mg 150 mL/hr over 120 Minutes Intravenous  Once 07/01/21 1538 07/01/21 2000   07/01/21 1515  metroNIDAZOLE (FLAGYL) IVPB 500 mg        500 mg 100 mL/hr over 60 Minutes Intravenous  Once 07/01/21 1505 07/01/21 1641         OBJECTIVE: Blood pressure (!) 109/53, pulse 76, temperature 99.5 F (37.5 C), temperature source Oral, resp. rate 16, height 6' (1.829 m), weight 60.3 kg, SpO2 96 %.  Physical Exam Vitals reviewed.  Constitutional:      General: He is not in acute distress.    Appearance: He is not ill-appearing, toxic-appearing or diaphoretic.  HENT:     Mouth/Throat:     Mouth: Mucous membranes are moist.     Pharynx: No oropharyngeal exudate.  Eyes:     Comments: Would  not open eyes on command, opens only briefly during exam.   Cardiovascular:  Rate and Rhythm: Normal rate and regular rhythm.  Pulmonary:     Effort: Pulmonary effort is normal.     Breath sounds: Normal breath sounds.  Chest:       Comments: Pacer is non-tender, no fluctuance, no erythema.  Abdominal:     General: Bowel sounds are normal. There is no distension.     Palpations: Abdomen is soft.     Tenderness: There is no abdominal tenderness.  Musculoskeletal:     Cervical back: Normal range of motion and neck supple.     Comments: His LLE wound is dressed, clean. His prev RLE scar is clean.     Lab Results Results for orders placed or performed during the hospital encounter of 07/01/21 (from the past 48 hour(s))  Glucose, capillary     Status: None   Collection Time: 07/07/21  4:59 PM  Result Value Ref Range   Glucose-Capillary 83 70 - 99 mg/dL    Comment: Glucose reference range applies only to samples taken after fasting for at least 8 hours.  Glucose, capillary     Status: Abnormal   Collection Time: 07/08/21 12:13 AM  Result Value Ref Range   Glucose-Capillary 102 (H) 70 - 99 mg/dL    Comment: Glucose reference range applies only to samples taken after fasting for at least 8 hours.  Basic metabolic panel     Status: Abnormal   Collection Time: 07/08/21  2:53 AM  Result Value Ref Range   Sodium 144 135 - 145 mmol/L   Potassium 4.1 3.5 - 5.1 mmol/L    Comment: SLIGHT HEMOLYSIS   Chloride 113 (H) 98 - 111 mmol/L   CO2 21 (L) 22 - 32 mmol/L   Glucose, Bld 102 (H) 70 - 99 mg/dL    Comment: Glucose reference range applies only to samples taken after fasting for at least 8 hours.   BUN 13 8 - 23 mg/dL   Creatinine, Ser 1.14 0.61 - 1.24 mg/dL   Calcium 7.8 (L) 8.9 - 10.3 mg/dL   GFR, Estimated >60 >60 mL/min    Comment: (NOTE) Calculated using the CKD-EPI Creatinine Equation (2021)    Anion gap 10 5 - 15    Comment: Performed at [facility] [address], [place], [place] [address]  CBC     Status: Abnormal   Collection Time: 07/08/21  2:53 AM  Result Value Ref Range   WBC 16.4 (H) 4.0 - 10.5 K/uL   RBC 3.30 (L) 4.22 - 5.81 MIL/uL   Hemoglobin 7.2 (L) 13.0 - 17.0 g/dL    Comment: Reticulocyte Hemoglobin testing may be clinically indicated, consider ordering this additional test [id number]    HCT 23.5 (L) 39.0 - 52.0 %   MCV 71.2 (L) 80.0 - 100.0 fL   MCH 21.8 (L) 26.0 - 34.0 pg   MCHC 30.6 30.0 - 36.0 g/dL   RDW 23.4 (H) 11.5 - 15.5 %   Platelets 185 150 - 400 K/uL    Comment: REPEATED TO VERIFY   nRBC 0.0 0.0 - 0.2 %    Comment: Performed at [facility], [place] [address] [place], [place] [address]  Glucose, capillary     Status: None   Collection Time: 07/08/21  8:11 AM  Result Value Ref Range   Glucose-Capillary 91 70 - 99 mg/dL    Comment: Glucose reference range applies only to samples taken after fasting for at least 8 hours.  Glucose, capillary  Status: Abnormal   Collection Time: 07/08/21 11:57 AM  Result Value Ref Range   Glucose-Capillary 128 (H) 70 - 99 mg/dL    Comment: Glucose reference range applies only to samples taken after fasting for at least 8 hours.  Glucose, capillary     Status: Abnormal   Collection Time: 07/08/21  4:16 PM  Result Value Ref Range   Glucose-Capillary 119 (H) 70 - 99 mg/dL    Comment: Glucose reference range applies only to samples taken after fasting for at least 8 hours.  Glucose, capillary     Status: Abnormal   Collection Time: 07/08/21  7:30 PM  Result Value Ref Range   Glucose-Capillary 126 (H) 70 - 99 mg/dL    Comment: Glucose reference range applies only to samples taken after fasting for at least 8 hours.  Glucose, capillary     Status: Abnormal   Collection Time: 07/09/21  1:50 AM  Result Value Ref Range   Glucose-Capillary 103 (H) 70 - 99 mg/dL    Comment: Glucose reference range applies only to samples taken after fasting for at least 8 hours.  Type and screen [place]     Status: None (Preliminary result)   Collection Time: 07/09/21  3:53 AM  Result Value Ref Range   ABO/RH(D) A POS    Antibody Screen NEG    Sample Expiration 07/12/2021,2359    Unit Number [id number]    Blood Component Type RED CELLS,LR    Unit division 00    Status of Unit ISSUED    Transfusion Status OK TO TRANSFUSE    Crossmatch Result      Compatible Performed at [facility], [place] [address], [place] [address]   CBC     Status: Abnormal   Collection Time: 07/09/21  3:53 AM  Result Value Ref Range   WBC 12.4 (H) 4.0 - 10.5 K/uL   RBC 3.10 (L) 4.22 - 5.81 MIL/uL   Hemoglobin 6.7 (LL) 13.0 - 17.0 g/dL    Comment: REPEATED TO VERIFY Reticulocyte Hemoglobin testing may be clinically indicated, consider ordering this additional test [id number] THIS CRITICAL RESULT HAS VERIFIED AND BEEN CALLED TO L. BAKER, RN BY JENNIFER COLE ON 10 20 2022 AT 0508, AND HAS BEEN READ BACK.     HCT 21.7 (L) 39.0 - 52.0 %   MCV 70.0 (L) 80.0 - 100.0 fL   MCH 21.6 (L) 26.0 - 34.0 pg   MCHC 30.9 30.0 - 36.0 g/dL   RDW 23.2 (H) 11.5 - 15.5 %   Platelets 203 150 - 400 K/uL    Comment: CONSISTENT WITH PREVIOUS RESULT REPEATED TO VERIFY    nRBC 0.0 0.0 - 0.2 %    Comment: Performed at [facility], [place] [address], [place], [place] [address]  Basic metabolic panel     Status: Abnormal   Collection Time: 07/09/21  3:53 AM  Result Value Ref Range   Sodium 144 135 - 145 mmol/L   Potassium 3.1 (L) 3.5 - 5.1 mmol/L   Chloride 114 (H) 98 - 111 mmol/L   CO2 23 22 - 32 mmol/L   Glucose, Bld 100 (H) 70 - 99 mg/dL    Comment: Glucose reference range applies only to samples taken after fasting for at least 8 hours.   BUN 13 8 - 23 mg/dL   Creatinine, Ser 1.16 0.61 - 1.24 mg/dL   Calcium 7.9 (L) 8.9 - 10.3 mg/dL   GFR, Estimated >  60 >60 mL/min    Comment: (NOTE) Calculated using the CKD-EPI Creatinine Equation (2021)    Anion gap 7 5 - 15    Comment:  Performed at [facility], [place] [address], [place], [place] [address]  Glucose, capillary     Status: None   Collection Time: 07/09/21  5:46 AM  Result Value Ref Range   Glucose-Capillary 91 70 - 99 mg/dL    Comment: Glucose reference range applies only to samples taken after fasting for at least 8 hours.  Prepare RBC (crossmatch)     Status: None   Collection Time: 07/09/21  6:00 AM  Result Value Ref Range   Order Confirmation      ORDER PROCESSED BY BLOOD BANK Performed at [facility], [address] [place], [place] [address]   Glucose, capillary     Status: None   Collection Time: 07/09/21  7:37 AM  Result Value Ref Range   Glucose-Capillary 83 70 - 99 mg/dL    Comment: Glucose reference range applies only to samples taken after fasting for at least 8 hours.  Glucose, capillary     Status: None   Collection Time: 07/09/21 11:40 AM  Result Value Ref Range   Glucose-Capillary 98 70 - 99 mg/dL    Comment: Glucose reference range applies only to samples taken after fasting for at least 8 hours.      Component Value Date/Time   SDES BLOOD LEFT ARM 07/01/2021 1540   SPECREQUEST  07/01/2021 1540    BOTTLES DRAWN AEROBIC AND ANAEROBIC Blood Culture adequate volume   CULT (A) 07/01/2021 1540    STREPTOCOCCUS DYSGALACTIAE SUSCEPTIBILITIES PERFORMED ON PREVIOUS CULTURE WITHIN THE LAST 5 DAYS. Performed at [facility], [place] [address] [place], [place] [address]    REPTSTATUS 07/04/2021 FINAL 07/01/2021 1540   No results found. Recent Results (from the past 240 hour(s))  Urine Culture     Status: Abnormal   Collection Time: 07/01/21  3:05 PM   Specimen: Urine, Catheterized  Result Value Ref Range Status   Specimen Description URINE, CATHETERIZED  Final   Special Requests   Final    NONE Performed at [facility], [address], [place], [place] [address]    Culture >=100,000 COLONIES/mL HAFNIA ALVEI (A)  Final   Report Status 07/06/2021 FINAL  Final    Organism ID, Bacteria HAFNIA ALVEI (A)  Final      Susceptibility   Hafnia alvei - MIC*    AMPICILLIN >=32 RESISTANT Resistant     CEFAZOLIN >=64 RESISTANT Resistant     CEFTRIAXONE >=64 RESISTANT Resistant     CIPROFLOXACIN <=0.25 SENSITIVE Sensitive     GENTAMICIN <=1 SENSITIVE Sensitive     IMIPENEM <=0.25 SENSITIVE Sensitive     NITROFURANTOIN 64 INTERMEDIATE Intermediate     TRIMETH/SULFA <=20 SENSITIVE Sensitive     AMPICILLIN/SULBACTAM >=32 RESISTANT Resistant     PIP/TAZO >=128 RESISTANT Resistant     * >=100,000 COLONIES/mL HAFNIA ALVEI  Blood Culture (routine x 2)     Status: Abnormal   Collection Time: 07/01/21  3:08 PM   Specimen: BLOOD  Result Value Ref Range Status   Specimen Description BLOOD SITE NOT SPECIFIED  Final   Special Requests   Final    BOTTLES DRAWN AEROBIC AND ANAEROBIC Blood Culture adequate volume   Culture  Setup Time   Final    GRAM POSITIVE COCCI IN CHAINS IN BOTH AEROBIC AND ANAEROBIC BOTTLES CRITICAL  RESULT CALLED TO, READ BACK BY AND VERIFIED WITH: PHARMD C.AMEND AT [place] 07/02/2021 BY [place]. Performed at [facility], [place] [address], [place], [place] [address]    Culture STREPTOCOCCUS DYSGALACTIAE (A)  Final   Report Status 07/04/2021 FINAL  Final   Organism ID, Bacteria STREPTOCOCCUS DYSGALACTIAE  Final      Susceptibility   Streptococcus dysgalactiae - MIC*    CLINDAMYCIN <=0.25 SENSITIVE Sensitive     AMPICILLIN <=0.25 SENSITIVE Sensitive     ERYTHROMYCIN <=0.12 SENSITIVE Sensitive     VANCOMYCIN 0.5 SENSITIVE Sensitive     CEFTRIAXONE <=0.12 SENSITIVE Sensitive     LEVOFLOXACIN 0.5 SENSITIVE Sensitive     PENICILLIN Value in next row Sensitive      SENSITIVE<=0.06    * STREPTOCOCCUS DYSGALACTIAE  Blood Culture ID Panel (Reflexed)     Status: Abnormal   Collection Time: 07/01/21  3:08 PM  Result Value Ref Range Status   Enterococcus faecalis NOT DETECTED NOT DETECTED Final   Enterococcus Faecium NOT DETECTED NOT DETECTED  Final   Listeria monocytogenes NOT DETECTED NOT DETECTED Final   Staphylococcus species NOT DETECTED NOT DETECTED Final   Staphylococcus aureus (BCID) NOT DETECTED NOT DETECTED Final   Staphylococcus epidermidis NOT DETECTED NOT DETECTED Final   Staphylococcus lugdunensis NOT DETECTED NOT DETECTED Final   Streptococcus species DETECTED (A) NOT DETECTED Final    Comment: Not Enterococcus species, Streptococcus agalactiae, Streptococcus pyogenes, or Streptococcus pneumoniae. CRITICAL RESULT CALLED TO, READ BACK BY AND VERIFIED WITH: C AMEND,[email or web address]  06/29/21 [place]    Streptococcus agalactiae NOT DETECTED NOT DETECTED Final   Streptococcus pneumoniae NOT DETECTED NOT DETECTED Final   Streptococcus pyogenes NOT DETECTED NOT DETECTED Final   A.calcoaceticus-baumannii NOT DETECTED NOT DETECTED Final   Bacteroides fragilis NOT DETECTED NOT DETECTED Final   Enterobacterales NOT DETECTED NOT DETECTED Final   Enterobacter cloacae complex NOT DETECTED NOT DETECTED Final   Escherichia coli NOT DETECTED NOT DETECTED Final   Klebsiella aerogenes NOT DETECTED NOT DETECTED Final   Klebsiella oxytoca NOT DETECTED NOT DETECTED Final   Klebsiella pneumoniae NOT DETECTED NOT DETECTED Final   Proteus species NOT DETECTED NOT DETECTED Final   Salmonella species NOT DETECTED NOT DETECTED Final   Serratia marcescens NOT DETECTED NOT DETECTED Final   Haemophilus influenzae NOT DETECTED NOT DETECTED Final   Neisseria meningitidis NOT DETECTED NOT DETECTED Final   Pseudomonas aeruginosa NOT DETECTED NOT DETECTED Final   Stenotrophomonas maltophilia NOT DETECTED NOT DETECTED Final   Candida albicans NOT DETECTED NOT DETECTED Final   Candida auris NOT DETECTED NOT DETECTED Final   Candida glabrata NOT DETECTED NOT DETECTED Final   Candida krusei NOT DETECTED NOT DETECTED Final   Candida parapsilosis NOT DETECTED NOT DETECTED Final   Candida tropicalis NOT DETECTED NOT DETECTED Final   Cryptococcus  neoformans/gattii NOT DETECTED NOT DETECTED Final    Comment: Performed at [facility], [address] [place], [place] [address]  Resp Panel by RT-PCR (Flu A&B, Covid) Nasopharyngeal Swab     Status: None   Collection Time: 07/01/21  3:15 PM   Specimen: Nasopharyngeal Swab; Nasopharyngeal(NP) swabs in vial transport medium  Result Value Ref Range Status   SARS Coronavirus 2 by RT PCR NEGATIVE NEGATIVE Final    Comment: (NOTE) SARS-CoV-2 target nucleic acids are NOT DETECTED.  The SARS-CoV-2 RNA is generally detectable in upper respiratory specimens during the acute phase of infection. The lowest concentration of SARS-CoV-2 viral copies this assay can detect  is 138 copies/mL. A negative result does not preclude SARS-Cov-2 infection and should not be used as the sole basis for treatment or other patient management decisions. A negative result may occur with  improper specimen collection/handling, submission of specimen other than nasopharyngeal swab, presence of viral mutation(s) within the areas targeted by this assay, and inadequate number of viral copies(<138 copies/mL). A negative result must be combined with clinical observations, patient history, and epidemiological information. The expected result is Negative.  Fact Sheet for Patients:  EntrepreneurPulse.com.au  Fact Sheet for Healthcare Providers:  IncredibleEmployment.be  This test is no t yet approved or cleared by the [place] FDA and  has been authorized for detection and/or diagnosis of SARS-CoV-2 by FDA under an Emergency Use Authorization (EUA). This EUA will remain  in effect (meaning this test can be used) for the duration of the COVID-19 declaration under Section 564(b)(1) of the Act, 21 U.S.C.section 360bbb-3(b)(1), unless the authorization is terminated  or revoked sooner.       Influenza A by PCR NEGATIVE NEGATIVE Final   Influenza B by PCR NEGATIVE NEGATIVE  Final    Comment: (NOTE) The Xpert Xpress SARS-CoV-2/FLU/RSV plus assay is intended as an aid in the diagnosis of influenza from Nasopharyngeal swab specimens and should not be used as a sole basis for treatment. Nasal washings and aspirates are unacceptable for Xpert Xpress SARS-CoV-2/FLU/RSV testing.  Fact Sheet for Patients: EntrepreneurPulse.com.au  Fact Sheet for Healthcare Providers: IncredibleEmployment.be  This test is not yet approved or cleared by the [place] FDA and has been authorized for detection and/or diagnosis of SARS-CoV-2 by FDA under an Emergency Use Authorization (EUA). This EUA will remain in effect (meaning this test can be used) for the duration of the COVID-19 declaration under Section 564(b)(1) of the Act, 21 U.S.C. section 360bbb-3(b)(1), unless the authorization is terminated or revoked.  Performed at [facility], [place] [address] [place], [place] [address]   Blood Culture (routine x 2)     Status: Abnormal   Collection Time: 07/01/21  3:40 PM   Specimen: BLOOD LEFT ARM  Result Value Ref Range Status   Specimen Description BLOOD LEFT ARM  Final   Special Requests   Final    BOTTLES DRAWN AEROBIC AND ANAEROBIC Blood Culture adequate volume   Culture  Setup Time   Final    GRAM POSITIVE COCCI IN CHAINS IN BOTH AEROBIC AND ANAEROBIC BOTTLES CRITICAL VALUE NOTED.  VALUE IS CONSISTENT WITH PREVIOUSLY REPORTED AND CALLED VALUE.    Culture (A)  Final    STREPTOCOCCUS DYSGALACTIAE SUSCEPTIBILITIES PERFORMED ON PREVIOUS CULTURE WITHIN THE LAST 5 DAYS. Performed at [facility], [place] [address], [place], [place] [address]    Report Status 07/04/2021 FINAL  Final    Microbiology: Recent Results (from the past 240 hour(s))  Urine Culture     Status: Abnormal   Collection Time: 07/01/21  3:05 PM   Specimen: Urine, Catheterized  Result Value Ref Range Status   Specimen Description URINE, CATHETERIZED   Final   Special Requests   Final    NONE Performed at [facility], [address], [place], [place] [address]    Culture >=100,000 COLONIES/mL HAFNIA ALVEI (A)  Final   Report Status 07/06/2021 FINAL  Final   Organism ID, Bacteria HAFNIA ALVEI (A)  Final      Susceptibility   Hafnia alvei - MIC*    AMPICILLIN >=32 RESISTANT Resistant     CEFAZOLIN >=64  RESISTANT Resistant     CEFTRIAXONE >=64 RESISTANT Resistant     CIPROFLOXACIN <=0.25 SENSITIVE Sensitive     GENTAMICIN <=1 SENSITIVE Sensitive     IMIPENEM <=0.25 SENSITIVE Sensitive     NITROFURANTOIN 64 INTERMEDIATE Intermediate     TRIMETH/SULFA <=20 SENSITIVE Sensitive     AMPICILLIN/SULBACTAM >=32 RESISTANT Resistant     PIP/TAZO >=128 RESISTANT Resistant     * >=100,000 COLONIES/mL HAFNIA ALVEI  Blood Culture (routine x 2)     Status: Abnormal   Collection Time: 07/01/21  3:08 PM   Specimen: BLOOD  Result Value Ref Range Status   Specimen Description BLOOD SITE NOT SPECIFIED  Final   Special Requests   Final    BOTTLES DRAWN AEROBIC AND ANAEROBIC Blood Culture adequate volume   Culture  Setup Time   Final    GRAM POSITIVE COCCI IN CHAINS IN BOTH AEROBIC AND ANAEROBIC BOTTLES CRITICAL RESULT CALLED TO, READ BACK BY AND VERIFIED WITH: PHARMD C.AMEND AT [place] 07/02/2021 BY [place]. Performed at [facility], [place] [address] [place],  [address]    Culture STREPTOCOCCUS DYSGALACTIAE (A)  Final   Report Status 07/04/2021 FINAL  Final   Organism ID, Bacteria STREPTOCOCCUS DYSGALACTIAE  Final      Susceptibility   Streptococcus dysgalactiae - MIC*    CLINDAMYCIN <=0.25 SENSITIVE Sensitive     AMPICILLIN <=0.25 SENSITIVE Sensitive     ERYTHROMYCIN <=0.12 SENSITIVE Sensitive     VANCOMYCIN 0.5 SENSITIVE Sensitive     CEFTRIAXONE <=0.12 SENSITIVE Sensitive     LEVOFLOXACIN 0.5 SENSITIVE Sensitive     PENICILLIN Value in next row Sensitive      SENSITIVE<=0.06    * STREPTOCOCCUS DYSGALACTIAE  Blood Culture  ID Panel (Reflexed)     Status: Abnormal   Collection Time: 07/01/21  3:08 PM  Result Value Ref Range Status   Enterococcus faecalis NOT DETECTED NOT DETECTED Final   Enterococcus Faecium NOT DETECTED NOT DETECTED Final   Listeria monocytogenes NOT DETECTED NOT DETECTED Final   Staphylococcus species NOT DETECTED NOT DETECTED Final   Staphylococcus aureus (BCID) NOT DETECTED NOT DETECTED Final   Staphylococcus epidermidis NOT DETECTED NOT DETECTED Final   Staphylococcus lugdunensis NOT DETECTED NOT DETECTED Final   Streptococcus species DETECTED (A) NOT DETECTED Final    Comment: Not Enterococcus species, Streptococcus agalactiae, Streptococcus pyogenes, or Streptococcus pneumoniae. CRITICAL RESULT CALLED TO, READ BACK BY AND VERIFIED WITH: C AMEND,[email or web address]  06/29/21 [place]    Streptococcus agalactiae NOT DETECTED NOT DETECTED Final   Streptococcus pneumoniae NOT DETECTED NOT DETECTED Final   Streptococcus pyogenes NOT DETECTED NOT DETECTED Final   A.calcoaceticus-baumannii NOT DETECTED NOT DETECTED Final   Bacteroides fragilis NOT DETECTED NOT DETECTED Final   Enterobacterales NOT DETECTED NOT DETECTED Final   Enterobacter cloacae complex NOT DETECTED NOT DETECTED Final   Escherichia coli NOT DETECTED NOT DETECTED Final   Klebsiella aerogenes NOT DETECTED NOT DETECTED Final   Klebsiella oxytoca NOT DETECTED NOT DETECTED Final   Klebsiella pneumoniae NOT DETECTED NOT DETECTED Final   Proteus species NOT DETECTED NOT DETECTED Final   Salmonella species NOT DETECTED NOT DETECTED Final   Serratia marcescens NOT DETECTED NOT DETECTED Final   Haemophilus influenzae NOT DETECTED NOT DETECTED Final   Neisseria meningitidis NOT DETECTED NOT DETECTED Final   Pseudomonas aeruginosa NOT DETECTED NOT DETECTED Final   Stenotrophomonas maltophilia NOT DETECTED NOT DETECTED Final   Candida albicans NOT DETECTED NOT DETECTED Final   Candida auris NOT DETECTED  NOT DETECTED Final   Candida glabrata  NOT DETECTED NOT DETECTED Final   Candida krusei NOT DETECTED NOT DETECTED Final   Candida parapsilosis NOT DETECTED NOT DETECTED Final   Candida tropicalis NOT DETECTED NOT DETECTED Final   Cryptococcus neoformans/gattii NOT DETECTED NOT DETECTED Final    Comment: Performed at [facility], [place] [address] [place], [place] [address]  Resp Panel by RT-PCR (Flu A&B, Covid) Nasopharyngeal Swab     Status: None   Collection Time: 07/01/21  3:15 PM   Specimen: Nasopharyngeal Swab; Nasopharyngeal(NP) swabs in vial transport medium  Result Value Ref Range Status   SARS Coronavirus 2 by RT PCR NEGATIVE NEGATIVE Final    Comment: (NOTE) SARS-CoV-2 target nucleic acids are NOT DETECTED.  The SARS-CoV-2 RNA is generally detectable in upper respiratory specimens during the acute phase of infection. The lowest concentration of SARS-CoV-2 viral copies this assay can detect is 138 copies/mL. A negative result does not preclude SARS-Cov-2 infection and should not be used as the sole basis for treatment or other patient management decisions. A negative result may occur with  improper specimen collection/handling, submission of specimen other than nasopharyngeal swab, presence of viral mutation(s) within the areas targeted by this assay, and inadequate number of viral copies(<138 copies/mL). A negative result must be combined with clinical observations, patient history, and epidemiological information. The expected result is Negative.  Fact Sheet for Patients:  EntrepreneurPulse.com.au  Fact Sheet for Healthcare Providers:  IncredibleEmployment.be  This test is no t yet approved or cleared by the [place] FDA and  has been authorized for detection and/or diagnosis of SARS-CoV-2 by FDA under an Emergency Use Authorization (EUA). This EUA will remain  in effect (meaning this test can be used) for the duration of the COVID-19 declaration under Section  564(b)(1) of the Act, 21 U.S.C.section 360bbb-3(b)(1), unless the authorization is terminated  or revoked sooner.       Influenza A by PCR NEGATIVE NEGATIVE Final   Influenza B by PCR NEGATIVE NEGATIVE Final    Comment: (NOTE) The Xpert Xpress SARS-CoV-2/FLU/RSV plus assay is intended as an aid in the diagnosis of influenza from Nasopharyngeal swab specimens and should not be used as a sole basis for treatment. Nasal washings and aspirates are unacceptable for Xpert Xpress SARS-CoV-2/FLU/RSV testing.  Fact Sheet for Patients: EntrepreneurPulse.com.au  Fact Sheet for Healthcare Providers: IncredibleEmployment.be  This test is not yet approved or cleared by the [place] FDA and has been authorized for detection and/or diagnosis of SARS-CoV-2 by FDA under an Emergency Use Authorization (EUA). This EUA will remain in effect (meaning this test can be used) for the duration of the COVID-19 declaration under Section 564(b)(1) of the Act, 21 U.S.C. section 360bbb-3(b)(1), unless the authorization is terminated or revoked.  Performed at [facility], [place] [address] [place], [place] [address]   Blood Culture (routine x 2)     Status: Abnormal   Collection Time: 07/01/21  3:40 PM   Specimen: BLOOD LEFT ARM  Result Value Ref Range Status   Specimen Description BLOOD LEFT ARM  Final   Special Requests   Final    BOTTLES DRAWN AEROBIC AND ANAEROBIC Blood Culture adequate volume   Culture  Setup Time   Final    GRAM POSITIVE COCCI IN CHAINS IN BOTH AEROBIC AND ANAEROBIC BOTTLES CRITICAL VALUE NOTED.  VALUE IS CONSISTENT WITH PREVIOUSLY REPORTED AND CALLED VALUE.    Culture (A)  Final    STREPTOCOCCUS DYSGALACTIAE  SUSCEPTIBILITIES PERFORMED ON PREVIOUS CULTURE WITHIN THE LAST 5 DAYS. Performed at [facility], [place] [address], [place], [place] [address]    Report Status 07/04/2021 FINAL  Final    Radiographs and labs were  personally reviewed by me.     Allergies  Allergen Reactions   Tuna [Fish Allergy] Nausea And Vomiting    OPAT Orders Discharge antibiotics to be given via midline Per pharmacy protocol PEN G continuous infusion Duration: 8 days End Date: 07-17-21  Midline Care Per Protocol: please  Home health RN for IV administration and teaching; PICC line care and labs.    Labs weekly while on IV antibiotics: _x_ CBC with differential __ BMP _x_ CMP __ CRP __ ESR __ Vancomycin trough __ CK  _x_ Please pull midline at completion of IV antibiotics __ Please leave PIC in place until doctor has seen patient or been notified  Fax weekly labs to [phone]  Clinic Follow Up Appt: PCP   Bobby Rumpf, MD [facility] for [facility]ious [place] [phone] 07/09/2021, 3:14 PM

## 2021-07-09 NOTE — Progress Notes (Addendum)
  Daily Progress Note  POD 2 Days Post-Op Left AKA  Subjective:  Patient doing well this afternoon. No complaints  Objective: Vitals:   07/09/21 1043 07/09/21 1444  BP: (!) 120/55 (!) 109/53  Pulse: 76 76  Resp: 18 16  Temp: 98.4 F (36.9 C) 99.5 F (37.5 C)  SpO2: 99% 96%    Physical Examination Right AKA well healed Left leg AKA incision well-opposed. No edema. 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 No concern for hematoma at this time, stump remains soft Will discuss restarting anticoagulation after dressing change tomorrow morning. Daily dressing changes - dry gauze and Tegaderm, stump sock by nursing Follow up scheduled in 2 weeks for wound check. Will continue to follow.  Can restart AC  Appreciate excellent care by primary team and nursing, please reach out with any concerns.    Cassandria Santee MD [facility] [phone] 07/09/2021  4:33 PM

## 2021-07-09 NOTE — Progress Notes (Signed)
PHARMACY CONSULT NOTE FOR:  OUTPATIENT  PARENTERAL ANTIBIOTIC THERAPY (OPAT)  Indication: Strep bacteremia Regimen: Penicillin G 16 million units every 24 hours as a continuous infusion End date: 07/17/21  IV antibiotic discharge orders are pended. To discharging provider:  please sign these orders via discharge navigator,  Select New Orders & click on the button choice - Manage This Unsigned Work.     Thank you for allowing pharmacy to be a part of this patient's care.  Jimmy Footman, PharmD, BCPS, BCIDP Infectious Diseases Clinical Pharmacist Phone: [phone] 07/09/2021, 4:05 PM

## 2021-07-09 NOTE — NC FL2 (Signed)
[place] LEVEL OF CARE SCREENING TOOL     IDENTIFICATION  Patient Name: Ryan [name] Birthdate: September 08, 1946 Sex: male Admission Date (Current Location): 07/01/2021  [facility] and [place] Number:  Herbalist and Address:  [facility] 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   Multiple pulmonary nodules 123456   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[place]) 06/06/2021   Normocytic anemia 06/06/2021   DVT of left external iliac vein 04/2021 06/06/2021   Pulmonary emboli 04/2021 06/06/2021   Pressure injury of skin 04/22/2021   Popliteal artery occlusion, 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   PAD (peripheral artery disease) ([place]) 03/06/2021   Acute on chronic combined systolic and diastolic CHF (congestive heart failure) ([place]) 09/06/2020   CHF (congestive heart failure) ([place]) 08/26/2020   Acute combined systolic and diastolic CHF, NYHA class 3 ([place])    Stage 3 chronic kidney disease ([place]) - baseline SCr 1.1    Onychomycosis of toenail 04/23/2020   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   PVC's (premature ventricular contractions) 06/12/2016   Junctional tachycardia ([place]) 09/16/2015   AKI (acute kidney injury) ([place]) 08/30/2015   SVT (supraventricular tachycardia)  long RP    Atherosclerosis of native arteries of the extremities with ulceration ([place]) 10/23/2012   Essential hypertension 09/29/2012   Type 2 diabetes mellitus ([place])  09/28/2012   Status post mitral valve replacement with bioprosthetic valve 123456   Chronic systolic heart failure (HCC) - LVEF 25 to 30% 09/16/2012   Peripheral vascular disease ([place]) 09/15/2012   Sepsis with acute renal failure without septic shock ([place]) 09/15/2012   Acute ischemic stroke ([place]) 09/15/2012    Orientation RESPIRATION BLADDER Height & Weight     Self, Time, Situation, Place  Normal Incontinent Weight: 60.3 kg Height:  6' (182.9 cm)  BEHAVIORAL SYMPTOMS/MOOD NEUROLOGICAL BOWEL NUTRITION STATUS   (n/a)  (n/a) Incontinent Diet  AMBULATORY STATUS COMMUNICATION OF NEEDS Skin   Total Care (bilateral AKA) Verbally Surgical wounds                       Personal Care Assistance Level of Assistance  Bathing, Feeding, Dressing, Total care Bathing Assistance: Maximum assistance Feeding assistance: Limited assistance (set up) Dressing Assistance: Maximum assistance Total Care Assistance: Maximum assistance   Functional Limitations Info  Sight, Hearing, Speech Sight Info: Adequate Hearing Info: Adequate Speech Info: Adequate    SPECIAL CARE FACTORS FREQUENCY  PT (By licensed PT), OT (By licensed OT)     PT Frequency: 5X OT Frequency: 5X            Contractures Contractures Info: Not present    Additional Factors Info  Code Status, Allergies, Psychotropic, Insulin Sliding Scale, Isolation Precautions, Suctioning Needs Code Status Info: Full Allergies Info: Tuna fish Psychotropic Info: see d/c summary Insulin Sliding Scale Info: see d/c summary or sliding scale info Isolation Precautions Info: n/a Suctioning Needs: n/a   Current Medications (07/09/2021):  This is the current hospital  active medication list Current Facility-Administered Medications  Medication Dose Route Frequency Provider Last Rate Last Admin   acetaminophen (TYLENOL) tablet 650 mg  650 mg Oral Q6H PRN Ulyses Amor, PA-C   650 mg at 07/08/21 0010   Or   acetaminophen (TYLENOL)  suppository 650 mg  650 mg Rectal Q6H PRN Laurence Slate M, PA-C       amiodarone (PACERONE) tablet 200 mg  200 mg Oral Daily Laurence Slate M, PA-C   200 mg at 07/09/21 0901   insulin aspart (novoLOG) injection 0-9 Units  0-9 Units Subcutaneous Q6H Ulyses Amor, [place]   1 Units at 07/08/21 1202   midodrine (PROAMATINE) tablet 10 mg  10 mg Oral TID WC Ulyses Amor, PA-C   10 mg at 07/09/21 1301   ondansetron (ZOFRAN) tablet 4 mg  4 mg Oral Q6H PRN Laurence Slate M, PA-C       Or   ondansetron [facility]) injection 4 mg  4 mg Intravenous Q6H PRN Laurence Slate M, PA-C       oxyCODONE (Oxy IR/ROXICODONE) immediate release tablet 5 mg  5 mg Oral Q6H PRN Laurence Slate M, PA-C   5 mg at 07/08/21 0010   penicillin G potassium 8 Million Units in dextrose 5 % 500 mL continuous infusion  8 Million Units Intravenous Q12H Eulogio Bear U, DO 41.7 mL/hr at 07/09/21 1258 8 Million Units at 07/09/21 1258     Discharge Medications: Please see discharge summary for a list of discharge medications.  Relevant Imaging Results:  Relevant Lab Results:   Additional Information SS# [id number]  Angelita Ingles, RN

## 2021-07-10 ENCOUNTER — Inpatient Hospital Stay (HOSPITAL_COMMUNITY): Payer: Medicare HMO

## 2021-07-10 DIAGNOSIS — I10 Essential (primary) hypertension: Secondary | ICD-10-CM | POA: Diagnosis not present

## 2021-07-10 DIAGNOSIS — R7881 Bacteremia: Secondary | ICD-10-CM

## 2021-07-10 DIAGNOSIS — R401 Stupor: Secondary | ICD-10-CM | POA: Diagnosis not present

## 2021-07-10 DIAGNOSIS — N183 Chronic kidney disease, stage 3 unspecified: Secondary | ICD-10-CM | POA: Diagnosis not present

## 2021-07-10 DIAGNOSIS — N179 Acute kidney failure, unspecified: Secondary | ICD-10-CM | POA: Diagnosis not present

## 2021-07-10 DIAGNOSIS — B955 Unspecified streptococcus as the cause of diseases classified elsewhere: Secondary | ICD-10-CM

## 2021-07-10 DIAGNOSIS — A408 Other streptococcal sepsis: Secondary | ICD-10-CM | POA: Diagnosis not present

## 2021-07-10 LAB — CBC
HCT: 23.2 % — ABNORMAL LOW (ref 39.0–52.0)
Hemoglobin: 7.2 g/dL — ABNORMAL LOW (ref 13.0–17.0)
MCH: 22 pg — ABNORMAL LOW (ref 26.0–34.0)
MCHC: 31 g/dL (ref 30.0–36.0)
MCV: 70.9 fL — ABNORMAL LOW (ref 80.0–100.0)
Platelets: 220 10*3/uL (ref 150–400)
RBC: 3.27 MIL/uL — ABNORMAL LOW (ref 4.22–5.81)
RDW: 24.6 % — ABNORMAL HIGH (ref 11.5–15.5)
WBC: 12 10*3/uL — ABNORMAL HIGH (ref 4.0–10.5)
nRBC: 0 % (ref 0.0–0.2)

## 2021-07-10 LAB — BPAM RBC
Blood Product Expiration Date: 202211042359
ISSUE DATE / TIME: 202210200643
Unit Type and Rh: 6200

## 2021-07-10 LAB — GLUCOSE, CAPILLARY
Glucose-Capillary: 114 mg/dL — ABNORMAL HIGH (ref 70–99)
Glucose-Capillary: 98 mg/dL (ref 70–99)
Glucose-Capillary: 107 mg/dL — ABNORMAL HIGH (ref 70–99)
Glucose-Capillary: 110 mg/dL — ABNORMAL HIGH (ref 70–99)
Glucose-Capillary: 102 mg/dL — ABNORMAL HIGH (ref 70–99)
Glucose-Capillary: 126 mg/dL — ABNORMAL HIGH (ref 70–99)

## 2021-07-10 LAB — BASIC METABOLIC PANEL
Anion gap: 5 (ref 5–15)
BUN: 13 mg/dL (ref 8–23)
CO2: 22 mmol/L (ref 22–32)
Calcium: 7.9 mg/dL — ABNORMAL LOW (ref 8.9–10.3)
Chloride: 115 mmol/L — ABNORMAL HIGH (ref 98–111)
Creatinine, Ser: 1.04 mg/dL (ref 0.61–1.24)
GFR, Estimated: >60 mL/min (ref >60)
Glucose, Bld: 120 mg/dL — ABNORMAL HIGH (ref 70–99)
Potassium: 3.6 mmol/L (ref 3.5–5.1)
Sodium: 142 mmol/L (ref 135–145)

## 2021-07-10 LAB — TYPE AND SCREEN
ABO/RH(D): A POS
Antibody Screen: NEGATIVE
Unit division: 0

## 2021-07-10 LAB — LACTATE DEHYDROGENASE: LDH: 256 U/L — ABNORMAL HIGH (ref 98–192)

## 2021-07-10 LAB — ECHOCARDIOGRAM LIMITED
MV VTI: 1.14 cm2
S' Lateral: 5.1 cm

## 2021-07-10 MED ORDER — FUROSEMIDE 40 MG PO TABS
40.0000 mg | ORAL_TABLET | Freq: Every day | ORAL | Status: DC
  Administered 2021-07-10: 40 mg via ORAL
  Filled 2021-07-10: qty 1

## 2021-07-10 MED ORDER — INFLUENZA VAC A&B SA ADJ QUAD 0.5 ML IM PRSY
0.5000 mL | PREFILLED_SYRINGE | INTRAMUSCULAR | Status: AC
  Administered 2021-07-13: 0.5 mL via INTRAMUSCULAR
  Filled 2021-07-10: qty 0.5

## 2021-07-10 MED ORDER — FUROSEMIDE 20 MG PO TABS
20.0000 mg | ORAL_TABLET | Freq: Every day | ORAL | Status: DC
  Administered 2021-07-11 – 2021-07-13 (×3): 20 mg via ORAL
  Filled 2021-07-10 (×3): qty 1

## 2021-07-10 NOTE — Progress Notes (Signed)
Progress Note    Ryan [name]  LFY:[id number] DOB: 28-Dec-1945  DOA: 07/01/2021 PCP: [name], MD    Brief Narrative:     Medical records reviewed and are as summarized below:  Ryan [name] is an 75 y.o. male with hx of chronic systolic CHF EF 85%, HTN, DM2, CKD stage 3, PVD s/p right AKA, ischemic left leg needing amputation but family refusing, hx of PE on Eliquis, hx of stroke, hx of retroperitoneal hematoma requiring left-sided nephrostomy tube due to urinary obstruction, who Presents to ER from SNF due to reported altered mental status.  He is s/p left AKA.     Assessment/Plan:   Principal Problem:   Altered mental status Active Problems:   Sepsis with acute renal failure without septic shock (HCC)   Chronic systolic heart failure (HCC) - LVEF 25 to 30%   Type 2 diabetes mellitus (HCC)   Essential hypertension   AKI (acute kidney injury) (HCC)   Stage 3 chronic kidney disease (HCC) - baseline SCr 1.1   PAD (peripheral artery disease) ([place])   Pulmonary emboli 04/2021   Nephrostomy status (HCC)   Sepsis/Streptococcus bacteremia -Patient presented with sepsis with acute kidney injury -Likely source of infection is left foot cellulitis, infected wounds -Blood culture growing Streptococcus dysagalactiae started on ceftriaxone- change to PCN -s/p left AKA -ID consult appreciated: Opat done, continuous infusion PEN at SNF Can get repeat BCx done by primary 1 week post completing anbx.  TTE pending.  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  -Amiodarone 200 mg p.o. daily  -Eliquis was changed to Lovenox -Discussed with vascular surgery,  can restart Eliquis  (will restart once hgb stable)  Hypokalemia -replete   Family Communication/Anticipated D/C date and plan/Code Status   DVT prophylaxis: Lovenox ordered. Code Status: Full Code.  Disposition Plan: Status is: Inpatient  Back to SNF once arrangements are made for IV abx and he will need IV access like midline or PICC         Medical Consultants:   Vascular ID  Subjective:  No current complaints  Objective:    Vitals:   07/09/21 2118 07/10/21 0450 07/10/21 0500 07/10/21 0804  BP: (!) 109/58 (!) 119/57  (!) 117/56  Pulse: 76 74  71  Resp: 16 16  18   Temp: 97.8 F (36.6 C)  99.2 F (37.3 C)  98.8 F (37.1 C)  TempSrc: Oral   Oral  SpO2: 100% 100%  100%  Weight:   59 kg   Height:        Intake/Output Summary (Last 24 hours) at 07/10/2021 1253 Last data filed at 07/09/2021 1655 Gross per 24 hour  Intake 164.72 ml  Output --  Net 164.72 ml   Filed Weights   07/07/21 0500 07/07/21 0931 07/10/21 0500  Weight: 60.3 kg 60.3 kg 59 kg     Exam:   General: Appearance:    Thin male in no acute distress     Lungs:      respirations unlabored  Heart:    Normal heart rate.    MS:   Left above knee amputation noted. Right above knee amputation noted.   Neurologic:   Awake, alert. No apparent focal neurological defect.         Data Reviewed:   I have personally reviewed following labs and imaging studies:  Labs: Labs show the following:   Basic Metabolic Panel: Recent Labs  Lab 07/07/21 1118 07/08/21 0253 07/09/21 0353 07/10/21 0254  NA 146* 144 144 142  K 3.4* 4.1 3.1* 3.6  CL  --  113* 114* 115*  CO2  --  21* 23 22  GLUCOSE  --  102* 100* 120*  BUN  --  13 13 13   CREATININE  --  1.14 1.16 1.04  CALCIUM  --  7.8* 7.9* 7.9*   GFR Estimated Creatinine Clearance: 49.6 mL/min (by C-G formula based on SCr of 1.04 mg/dL). Liver Function Tests: No results for input(s): AST, ALT, ALKPHOS, BILITOT, PROT, ALBUMIN in the last 168 hours.  No results for input(s): LIPASE, AMYLASE in the last 168 hours. No results for input(s): AMMONIA in the last 168 hours. Coagulation profile No results for input(s): INR, PROTIME in the last 168 hours.   CBC: Recent Labs  Lab 07/07/21 1118 07/08/21 0253 07/09/21 0353 07/10/21 0254  WBC  --  16.4* 12.4* 12.0*  HGB 8.8* 7.2* 6.7* 7.2*  HCT 26.0* 23.5* 21.7* 23.2*  MCV  --  71.2* 70.0* 70.9*  PLT  --  185 203 220   Cardiac Enzymes: No results for input(s): CKTOTAL, CKMB, CKMBINDEX, TROPONINI in the last 168 hours. BNP (last 3 results) Recent Labs    08/25/20 1121 03/27/21 1123  PROBNP 21,395* 3,146*   CBG: Recent Labs  Lab 07/09/21 2029 07/09/21 2120 07/10/21 0632 07/10/21 0801 07/10/21 1125  GLUCAP 124* 117* 114* 98 107*   D-Dimer: No results for input(s): DDIMER in the last 72 hours. Hgb A1c: No results for input(s): HGBA1C in the last 72 hours. Lipid Profile: No results for input(s): CHOL, HDL, LDLCALC, TRIG, CHOLHDL, LDLDIRECT in the last 72  hours. Thyroid function studies: No results for input(s): TSH, T4TOTAL, T3FREE, THYROIDAB in the last 72 hours.  Invalid input(s): FREET3 Anemia work up: No results for input(s): VITAMINB12, FOLATE, FERRITIN, TIBC, IRON, RETICCTPCT in the last 72 hours. Sepsis Labs: Recent Labs  Lab 07/08/21 0253 07/09/21 0353 07/10/21 0254  WBC 16.4* 12.4* 12.0*    Microbiology Recent Results (from the past 240 hour(s))  Urine Culture     Status: Abnormal   Collection Time: 07/01/21  3:05 PM   Specimen: Urine, Catheterized  Result Value Ref Range Status   Specimen Description URINE, CATHETERIZED  Final   Special Requests   Final    NONE Performed at [facility], [address], [place], [place] [address]    Culture >=100,000 COLONIES/mL HAFNIA ALVEI (A)  Final   Report Status 07/06/2021 FINAL  Final   Organism ID, Bacteria HAFNIA ALVEI (A)  Final      Susceptibility   Hafnia alvei - MIC*    AMPICILLIN >=32 RESISTANT Resistant     CEFAZOLIN >=64 RESISTANT Resistant     CEFTRIAXONE >=64 RESISTANT Resistant     CIPROFLOXACIN <=0.25 SENSITIVE Sensitive     GENTAMICIN <=1 SENSITIVE Sensitive     IMIPENEM <=0.25 SENSITIVE Sensitive     NITROFURANTOIN 64 INTERMEDIATE Intermediate     TRIMETH/SULFA <=20 SENSITIVE Sensitive     AMPICILLIN/SULBACTAM >=32 RESISTANT Resistant     PIP/TAZO >=128 RESISTANT Resistant     * >=100,000 COLONIES/mL HAFNIA ALVEI  Blood Culture (routine x 2)     Status: Abnormal   Collection Time: 07/01/21  3:08 PM   Specimen: BLOOD  Result Value Ref Range Status   Specimen Description BLOOD SITE NOT SPECIFIED  Final   Special Requests   Final    BOTTLES DRAWN AEROBIC AND ANAEROBIC Blood Culture adequate volume   Culture  Setup Time   Final    GRAM POSITIVE COCCI IN CHAINS IN BOTH AEROBIC AND ANAEROBIC BOTTLES CRITICAL RESULT CALLED TO, READ BACK BY AND VERIFIED WITH: PHARMD C.AMEND AT [place] 07/02/2021 BY [place]. Performed at [facility], [place]  [address] [place], [place] [address]    Culture STREPTOCOCCUS DYSGALACTIAE (A)  Final   Report Status 07/04/2021 FINAL  Final   Organism ID, Bacteria STREPTOCOCCUS DYSGALACTIAE  Final      Susceptibility   Streptococcus dysgalactiae - MIC*    CLINDAMYCIN <=0.25 SENSITIVE Sensitive     AMPICILLIN <=0.25 SENSITIVE Sensitive     ERYTHROMYCIN <=0.12 SENSITIVE Sensitive     VANCOMYCIN 0.5 SENSITIVE Sensitive     CEFTRIAXONE <=0.12 SENSITIVE Sensitive     LEVOFLOXACIN 0.5 SENSITIVE Sensitive     PENICILLIN Value in next row Sensitive      SENSITIVE<=0.06    * STREPTOCOCCUS DYSGALACTIAE  Blood Culture ID Panel (Reflexed)     Status: Abnormal   Collection Time: 07/01/21  3:08 PM  Result Value Ref Range Status   Enterococcus faecalis NOT DETECTED NOT DETECTED Final   Enterococcus Faecium NOT DETECTED NOT DETECTED Final   Listeria monocytogenes NOT DETECTED NOT DETECTED Final   Staphylococcus species NOT DETECTED NOT DETECTED Final   Staphylococcus aureus (BCID) NOT DETECTED NOT DETECTED Final   Staphylococcus epidermidis NOT DETECTED NOT DETECTED Final   Staphylococcus lugdunensis NOT DETECTED NOT DETECTED Final   Streptococcus species DETECTED (A) NOT DETECTED Final    Comment: Not Enterococcus species, Streptococcus agalactiae, Streptococcus pyogenes, or Streptococcus pneumoniae. CRITICAL RESULT CALLED TO, READ BACK BY AND VERIFIED WITH: C AMEND,[email or web address]  06/29/21 [place]    Streptococcus agalactiae NOT DETECTED NOT DETECTED Final   Streptococcus pneumoniae NOT DETECTED NOT DETECTED Final   Streptococcus pyogenes NOT DETECTED NOT DETECTED Final   A.calcoaceticus-baumannii NOT DETECTED NOT DETECTED Final   Bacteroides fragilis NOT DETECTED NOT DETECTED Final   Enterobacterales NOT DETECTED NOT DETECTED Final   Enterobacter cloacae complex NOT DETECTED NOT DETECTED Final   Escherichia coli NOT DETECTED NOT DETECTED Final   Klebsiella aerogenes NOT DETECTED NOT DETECTED Final   Klebsiella oxytoca  NOT DETECTED NOT DETECTED Final   Klebsiella pneumoniae NOT DETECTED NOT DETECTED Final   Proteus species NOT DETECTED NOT DETECTED Final   Salmonella species NOT DETECTED NOT DETECTED Final  Serratia marcescens NOT DETECTED NOT DETECTED Final   Haemophilus influenzae NOT DETECTED NOT DETECTED Final   Neisseria meningitidis NOT DETECTED NOT DETECTED Final   Pseudomonas aeruginosa NOT DETECTED NOT DETECTED Final   Stenotrophomonas maltophilia NOT DETECTED NOT DETECTED Final   Candida albicans NOT DETECTED NOT DETECTED Final   Candida auris NOT DETECTED NOT DETECTED Final   Candida glabrata NOT DETECTED NOT DETECTED Final   Candida krusei NOT DETECTED NOT DETECTED Final   Candida parapsilosis NOT DETECTED NOT DETECTED Final   Candida tropicalis NOT DETECTED NOT DETECTED Final   Cryptococcus neoformans/gattii NOT DETECTED NOT DETECTED Final    Comment: Performed at [facility], [place] [address] [place], [place] [address]  Resp Panel by RT-PCR (Flu A&B, Covid) Nasopharyngeal Swab     Status: None   Collection Time: 07/01/21  3:15 PM   Specimen: Nasopharyngeal Swab; Nasopharyngeal(NP) swabs in vial transport medium  Result Value Ref Range Status   SARS Coronavirus 2 by RT PCR NEGATIVE NEGATIVE Final    Comment: (NOTE) SARS-CoV-2 target nucleic acids are NOT DETECTED.  The SARS-CoV-2 RNA is generally detectable in upper respiratory specimens during the acute phase of infection. The lowest concentration of SARS-CoV-2 viral copies this assay can detect is 138 copies/mL. A negative result does not preclude SARS-Cov-2 infection and should not be used as the sole basis for treatment or other patient management decisions. A negative result may occur with  improper specimen collection/handling, submission of specimen other than nasopharyngeal swab, presence of viral mutation(s) within the areas targeted by this assay, and inadequate number of viral copies(<138 copies/mL). A negative  result must be combined with clinical observations, patient history, and epidemiological information. The expected result is Negative.  Fact Sheet for Patients:  EntrepreneurPulse.com.au  Fact Sheet for Healthcare Providers:  IncredibleEmployment.be  This test is no t yet approved or cleared by the [place] FDA and  has been authorized for detection and/or diagnosis of SARS-CoV-2 by FDA under an Emergency Use Authorization (EUA). This EUA will remain  in effect (meaning this test can be used) for the duration of the COVID-19 declaration under Section 564(b)(1) of the Act, 21 U.S.C.section 360bbb-3(b)(1), unless the authorization is terminated  or revoked sooner.       Influenza A by PCR NEGATIVE NEGATIVE Final   Influenza B by PCR NEGATIVE NEGATIVE Final    Comment: (NOTE) The Xpert Xpress SARS-CoV-2/FLU/RSV plus assay is intended as an aid in the diagnosis of influenza from Nasopharyngeal swab specimens and should not be used as a sole basis for treatment. Nasal washings and aspirates are unacceptable for Xpert Xpress SARS-CoV-2/FLU/RSV testing.  Fact Sheet for Patients: EntrepreneurPulse.com.au  Fact Sheet for Healthcare Providers: IncredibleEmployment.be  This test is not yet approved or cleared by the [place] FDA and has been authorized for detection and/or diagnosis of SARS-CoV-2 by FDA under an Emergency Use Authorization (EUA). This EUA will remain in effect (meaning this test can be used) for the duration of the COVID-19 declaration under Section 564(b)(1) of the Act, 21 U.S.C. section 360bbb-3(b)(1), unless the authorization is terminated or revoked.  Performed at [facility], [place] [address] [place], [place] [address]   Blood Culture (routine x 2)     Status: Abnormal   Collection Time: 07/01/21  3:40 PM   Specimen: BLOOD LEFT ARM  Result Value Ref Range Status    Specimen Description BLOOD LEFT ARM  Final   Special Requests   Final  BOTTLES DRAWN AEROBIC AND ANAEROBIC Blood Culture adequate volume   Culture  Setup Time   Final    GRAM POSITIVE COCCI IN CHAINS IN BOTH AEROBIC AND ANAEROBIC BOTTLES CRITICAL VALUE NOTED.  VALUE IS CONSISTENT WITH PREVIOUSLY REPORTED AND CALLED VALUE.    Culture (A)  Final    STREPTOCOCCUS DYSGALACTIAE SUSCEPTIBILITIES PERFORMED ON PREVIOUS CULTURE WITHIN THE LAST 5 DAYS. Performed at [facility], [place] [address] [place], [place] [address]    Report Status 07/04/2021 FINAL  Final  Culture, blood (Routine X 2) w Reflex to ID Panel     Status: None (Preliminary result)   Collection Time: 07/09/21  3:58 PM   Specimen: BLOOD LEFT FOREARM  Result Value Ref Range Status   Specimen Description BLOOD LEFT FOREARM  Final   Special Requests   Final    BOTTLES DRAWN AEROBIC AND ANAEROBIC Blood Culture adequate volume   Culture   Final    NO GROWTH < 24 HOURS Performed at [facility], [place] [address], [place], [place] [address]    Report Status PENDING  Incomplete    Procedures and diagnostic studies:  No results found.  Medications:    amiodarone  200 mg Oral Daily   furosemide  40 mg Oral Daily   insulin aspart  0-9 Units Subcutaneous Q6H   midodrine  10 mg Oral TID WC   Continuous Infusions:  penicillin g continuous IV infusion 8 Million Units (07/10/21 1216)     LOS: 9 days   Geradine Girt  [facility]   How to contact the [facility] Attending or Consulting provider [place] or covering provider during after hours [place] [facility], for this patient?  Check the care team in [facility] and look for a) attending/consulting TR[facility] provider listed and b) the [facility] team listed Log into [email or web address] and use [place]'s universal password to access. If you do not have the password, please contact the hospital operator. Locate the [facility] provider you are looking for under [facility] and page to a number that  you can be directly reached. If you still have difficulty reaching the provider, please page the [facility] (Director on Call) for the Hospitalists listed on amion for assistance.  07/10/2021, 12:53 PM

## 2021-07-10 NOTE — Progress Notes (Signed)
  Echocardiogram 2D Echocardiogram has been performed.  Ryan [name] 07/10/2021, 2:27 PM

## 2021-07-10 NOTE — Progress Notes (Signed)
Physical Therapy Treatment Patient Details Name: Ryan [name] MRN: [id number] DOB: 30-Oct-1945 Today's Date: 07/10/2021   History of Present Illness Pt is a 75 year old male admitted 10/12 with AMS, ischemic LLE with gangrene L foot. 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 PMH of chronic systolic CHF, EF 92%, hypertension, diabetes mellitus type 2, CKD stage III, PVD s/p R AKA (04/2021), h/o pulmonary embolism on Eliquis, h/o CVA.    PT Comments    Pt motivated to participate in therapy and regain mobility. He required mod assist bed mobility, and +2 mod/max assist lateral scoot transfer using bed pad, bed to recliner. Pt performed LE exercises in recliner. Stump sock soiled with drainage. Donned clean stump sock and washed out soiled one. Pt in recliner at end of session. Daughter present in room.    Recommendations for follow up therapy are one component of a multi-disciplinary discharge planning process, led by the attending physician.  Recommendations may be updated based on patient status, additional functional criteria and insurance authorization.  Follow Up Recommendations  SNF (plan is for return to [facility])     Financial risk analyst (measurements PT);Wheelchair cushion (measurements PT);Other (comment) (sliding board)    Recommendations for Other Services       Precautions / Restrictions Precautions Precautions: Fall;Other (comment) Precaution Comments: nephrostomy bag Restrictions LLE Weight Bearing: Non weight bearing Other Position/Activity Restrictions: stump sock L residual limb     Mobility  Bed Mobility Overal bed mobility: Needs Assistance Bed Mobility: Supine to Sit     Supine to sit: Mod assist;HOB elevated          Transfers Overall transfer level: Needs assistance Equipment used:  (bed pad) Transfers: Lateral/Scoot Transfers          Lateral/Scoot Transfers: +2 physical assistance;Max assist;Mod assist General transfer  comment: lateral scoot bed to recliner using bed pad  Ambulation/Gait                 Stairs             Wheelchair Mobility    Modified Rankin (Stroke Patients Only)       Balance Overall balance assessment: Needs assistance Sitting-balance support: Single extremity supported;Bilateral upper extremity supported;Feet unsupported Sitting balance-Leahy Scale: Poor Sitting balance - Comments: reliant on external assist                                    Cognition Arousal/Alertness: Awake/alert Behavior During Therapy: WFL for tasks assessed/performed Overall Cognitive Status: Within Functional Limits for tasks assessed                                        Exercises Amputee Exercises Gluteal Sets: AROM;Both;10 reps Hip Extension:  (verbally instructed on hip ext exercises while sidelying in bed) Hip ABduction/ADduction: AROM;Right;Left;10 reps Hip Flexion/Marching: AROM;Right;Left;10 reps    General Comments        Pertinent Vitals/Pain Pain Assessment: 0-10 Pain Score: 5  Pain Location: L residual limb Pain Descriptors / Indicators: Grimacing;Guarding;Discomfort Pain Intervention(s): Monitored during session;Repositioned    Home Living                      Prior Function            PT Goals (current goals can now be found in  the care plan section) Acute Rehab PT Goals Patient Stated Goal: move better Progress towards PT goals: Progressing toward goals    Frequency    Min 3X/week      PT Plan Discharge plan needs to be updated    Co-evaluation              AM-PAC PT "6 Clicks" Mobility   Outcome Measure  Help needed turning from your back to your side while in a flat bed without using bedrails?: A Little Help needed moving from lying on your back to sitting on the side of a flat bed without using bedrails?: A Lot Help needed moving to and from a bed to a chair (including a wheelchair)?:  Total Help needed standing up from a chair using your arms (e.g., wheelchair or bedside chair)?: Total Help needed to walk in hospital room?: Total Help needed climbing 3-5 steps with a railing? : Total 6 Click Score: 9    End of Session Equipment Utilized During Treatment: Gait belt Activity Tolerance: Patient tolerated treatment well Patient left: in chair;with call bell/phone within reach Nurse Communication: Mobility status PT Visit Diagnosis: Other abnormalities of gait and mobility (R26.89);Pain Pain - Right/Left: Left Pain - part of body: Leg     Time: 8299-3716 PT Time Calculation (min) (ACUTE ONLY): 24 min  Charges:  $Therapeutic Exercise: 8-22 mins $Therapeutic Activity: 8-22 mins                     Lorrin Goodell, PT  Office # [phone] Pager [phone]    Lorriane Shire 07/10/2021, 10:57 AM

## 2021-07-10 NOTE — Progress Notes (Addendum)
INFECTIOUS DISEASE PROGRESS NOTE  ID: Ryan [name] is a 75 y.o. male with  Principal Problem:   Altered mental status Active Problems:   Sepsis with acute renal failure without septic shock (HCC)   Chronic systolic heart failure (HCC) - LVEF 25 to 30%   Type 2 diabetes mellitus (HCC)   Essential hypertension   AKI (acute kidney injury) (HCC)   Stage 3 chronic kidney disease (HCC) - baseline SCr 1.1   PAD (peripheral artery disease) ([place])   Pulmonary emboli 04/2021   Nephrostomy status (HCC)  Subjective: No complaints.  Wants food warmed.   Abtx:  Anti-infectives (From admission, onward)    Start     Dose/Rate Route Frequency Ordered Stop   07/09/21 0800  penicillin G potassium 8 Million Units in dextrose 5 % 500 mL continuous infusion        8 Million Units 41.7 mL/hr over 12 Hours Intravenous Every 12 hours 07/08/21 0944     07/07/21 1000  cefUROXime (ZINACEF) 1.5 g in sodium chloride 0.9 % 100 mL IVPB        1.5 g 200 mL/hr over 30 Minutes Intravenous To [facility] 07/06/21 0655 07/07/21 1049   07/07/21 0945  vancomycin (VANCOCIN) IVPB 1000 mg/200 mL premix  Status:  Discontinued        1,000 mg 200 mL/hr over 60 Minutes Intravenous On call to O.R. 07/07/21 0934 07/07/21 1320   07/07/21 0935  vancomycin (VANCOCIN) 1-5 GM/200ML-% IVPB  Status:  Discontinued       Note to Pharmacy: Roosvelt Maser   : cabinet override      07/07/21 0935 07/07/21 0939   07/02/21 1900  vancomycin (VANCOCIN) IVPB 1000 mg/200 mL premix  Status:  Discontinued        1,000 mg 200 mL/hr over 60 Minutes Intravenous Every 24 hours 07/01/21 1834 07/02/21 0656   07/02/21 1000  ceFEPIme (MAXIPIME) 2 g in sodium chloride 0.9 % 100 mL IVPB  Status:  Discontinued        2 g 200 mL/hr over 30 Minutes Intravenous Every 12 hours 07/01/21 1834 07/02/21 0656   07/02/21 0800  metroNIDAZOLE (FLAGYL) IVPB 500 mg        500 mg 100 mL/hr over 60 Minutes Intravenous Every 12 hours 07/01/21 2342  07/08/21 2258   07/02/21 0800  cefTRIAXone (ROCEPHIN) 2 g in sodium chloride 0.9 % 100 mL IVPB        2 g 200 mL/hr over 30 Minutes Intravenous Every 24 hours 07/02/21 0656 07/08/21 0833   07/01/21 1545  ceFEPIme (MAXIPIME) 2 g in sodium chloride 0.9 % 100 mL IVPB        2 g 200 mL/hr over 30 Minutes Intravenous  Once 07/01/21 1537 07/01/21 1756   07/01/21 1545  vancomycin (VANCOREADY) IVPB 1500 mg/300 mL        1,500 mg 150 mL/hr over 120 Minutes Intravenous  Once 07/01/21 1538 07/01/21 2000   07/01/21 1515  metroNIDAZOLE (FLAGYL) IVPB 500 mg        500 mg 100 mL/hr over 60 Minutes Intravenous  Once 07/01/21 1505 07/01/21 1641       Medications: Scheduled:  amiodarone  200 mg Oral Daily   insulin aspart  0-9 Units Subcutaneous Q6H   midodrine  10 mg Oral TID WC    Objective: Vital signs in last 24 hours: Temp:  [97.8 F (36.6 C)-99.5 F (37.5 C)] 98.8 F (37.1 C) (10/21 0804) Pulse Rate:  [71-76]  71 (10/21 0804) Resp:  [16-20] 18 (10/21 0804) BP: (109-120)/(53-58) 117/56 (10/21 0804) SpO2:  [96 %-100 %] 100 % (10/21 0804) Weight:  [59 kg] 59 kg (10/21 0500)   General appearance: alert, cooperative, and no distress Resp: clear to auscultation bilaterally Chest wall: pacer non-tedder, no fluctuance.  Cardio: regular rate and rhythm GI: normal findings: bowel sounds normal and soft, non-tender  Lab Results Recent Labs    07/09/21 0353 07/10/21 0254  WBC 12.4* 12.0*  HGB 6.7* 7.2*  HCT 21.7* 23.2*  NA 144 142  K 3.1* 3.6  CL 114* 115*  CO2 23 22  BUN 13 13  CREATININE 1.16 1.04   Liver Panel No results for input(s): PROT, ALBUMIN, AST, ALT, ALKPHOS, BILITOT, BILIDIR, IBILI in the last 72 hours. Sedimentation Rate No results for input(s): ESRSEDRATE in the last 72 hours. C-Reactive Protein No results for input(s): CRP in the last 72 hours.  Microbiology: Recent Results (from the past 240 hour(s))  Urine Culture     Status: Abnormal   Collection Time:  07/01/21  3:05 PM   Specimen: Urine, Catheterized  Result Value Ref Range Status   Specimen Description URINE, CATHETERIZED  Final   Special Requests   Final    NONE Performed at [facility], [address], [place], [place] [address]    Culture >=100,000 COLONIES/mL HAFNIA ALVEI (A)  Final   Report Status 07/06/2021 FINAL  Final   Organism ID, Bacteria HAFNIA ALVEI (A)  Final      Susceptibility   Hafnia alvei - MIC*    AMPICILLIN >=32 RESISTANT Resistant     CEFAZOLIN >=64 RESISTANT Resistant     CEFTRIAXONE >=64 RESISTANT Resistant     CIPROFLOXACIN <=0.25 SENSITIVE Sensitive     GENTAMICIN <=1 SENSITIVE Sensitive     IMIPENEM <=0.25 SENSITIVE Sensitive     NITROFURANTOIN 64 INTERMEDIATE Intermediate     TRIMETH/SULFA <=20 SENSITIVE Sensitive     AMPICILLIN/SULBACTAM >=32 RESISTANT Resistant     PIP/TAZO >=128 RESISTANT Resistant     * >=100,000 COLONIES/mL HAFNIA ALVEI  Blood Culture (routine x 2)     Status: Abnormal   Collection Time: 07/01/21  3:08 PM   Specimen: BLOOD  Result Value Ref Range Status   Specimen Description BLOOD SITE NOT SPECIFIED  Final   Special Requests   Final    BOTTLES DRAWN AEROBIC AND ANAEROBIC Blood Culture adequate volume   Culture  Setup Time   Final    GRAM POSITIVE COCCI IN CHAINS IN BOTH AEROBIC AND ANAEROBIC BOTTLES CRITICAL RESULT CALLED TO, READ BACK BY AND VERIFIED WITH: PHARMD C.AMEND AT [place] 07/02/2021 BY [place]. Performed at [facility], [place] [address] [place], [place] [address]    Culture STREPTOCOCCUS DYSGALACTIAE (A)  Final   Report Status 07/04/2021 FINAL  Final   Organism ID, Bacteria STREPTOCOCCUS DYSGALACTIAE  Final      Susceptibility   Streptococcus dysgalactiae - MIC*    CLINDAMYCIN <=0.25 SENSITIVE Sensitive     AMPICILLIN <=0.25 SENSITIVE Sensitive     ERYTHROMYCIN <=0.12 SENSITIVE Sensitive     VANCOMYCIN 0.5 SENSITIVE Sensitive     CEFTRIAXONE <=0.12 SENSITIVE Sensitive     LEVOFLOXACIN 0.5  SENSITIVE Sensitive     PENICILLIN Value in next row Sensitive      SENSITIVE<=0.06    * STREPTOCOCCUS DYSGALACTIAE  Blood Culture ID Panel (Reflexed)     Status: Abnormal   Collection Time: 07/01/21  3:08 PM  Result Value Ref Range Status   Enterococcus faecalis NOT DETECTED NOT DETECTED Final   Enterococcus Faecium NOT DETECTED NOT DETECTED Final   Listeria monocytogenes NOT DETECTED NOT DETECTED Final   Staphylococcus species NOT DETECTED NOT DETECTED Final   Staphylococcus aureus (BCID) NOT DETECTED NOT DETECTED Final   Staphylococcus epidermidis NOT DETECTED NOT DETECTED Final   Staphylococcus lugdunensis NOT DETECTED NOT DETECTED Final   Streptococcus species DETECTED (A) NOT DETECTED Final    Comment: Not Enterococcus species, Streptococcus agalactiae, Streptococcus pyogenes, or Streptococcus pneumoniae. CRITICAL RESULT CALLED TO, READ BACK BY AND VERIFIED WITH: C AMEND,[email or web address]  06/29/21 [place]    Streptococcus agalactiae NOT DETECTED NOT DETECTED Final   Streptococcus pneumoniae NOT DETECTED NOT DETECTED Final   Streptococcus pyogenes NOT DETECTED NOT DETECTED Final   A.calcoaceticus-baumannii NOT DETECTED NOT DETECTED Final   Bacteroides fragilis NOT DETECTED NOT DETECTED Final   Enterobacterales NOT DETECTED NOT DETECTED Final   Enterobacter cloacae complex NOT DETECTED NOT DETECTED Final   Escherichia coli NOT DETECTED NOT DETECTED Final   Klebsiella aerogenes NOT DETECTED NOT DETECTED Final   Klebsiella oxytoca NOT DETECTED NOT DETECTED Final   Klebsiella pneumoniae NOT DETECTED NOT DETECTED Final   Proteus species NOT DETECTED NOT DETECTED Final   Salmonella species NOT DETECTED NOT DETECTED Final   Serratia marcescens NOT DETECTED NOT DETECTED Final   Haemophilus influenzae NOT DETECTED NOT DETECTED Final   Neisseria meningitidis NOT DETECTED NOT DETECTED Final   Pseudomonas aeruginosa NOT DETECTED NOT DETECTED Final   Stenotrophomonas maltophilia NOT DETECTED NOT  DETECTED Final   Candida albicans NOT DETECTED NOT DETECTED Final   Candida auris NOT DETECTED NOT DETECTED Final   Candida glabrata NOT DETECTED NOT DETECTED Final   Candida krusei NOT DETECTED NOT DETECTED Final   Candida parapsilosis NOT DETECTED NOT DETECTED Final   Candida tropicalis NOT DETECTED NOT DETECTED Final   Cryptococcus neoformans/gattii NOT DETECTED NOT DETECTED Final    Comment: Performed at [facility], [address], [place], [place] [address]  Resp Panel by RT-PCR (Flu A&B, Covid) Nasopharyngeal Swab     Status: None   Collection Time: 07/01/21  3:15 PM   Specimen: Nasopharyngeal Swab; Nasopharyngeal(NP) swabs in vial transport medium  Result Value Ref Range Status   SARS Coronavirus 2 by RT PCR NEGATIVE NEGATIVE Final    Comment: (NOTE) SARS-CoV-2 target nucleic acids are NOT DETECTED.  The SARS-CoV-2 RNA is generally detectable in upper respiratory specimens during the acute phase of infection. The lowest concentration of SARS-CoV-2 viral copies this assay can detect is 138 copies/mL. A negative result does not preclude SARS-Cov-2 infection and should not be used as the sole basis for treatment or other patient management decisions. A negative result may occur with  improper specimen collection/handling, submission of specimen other than nasopharyngeal swab, presence of viral mutation(s) within the areas targeted by this assay, and inadequate number of viral copies(<138 copies/mL). A negative result must be combined with clinical observations, patient history, and epidemiological information. The expected result is Negative.  Fact Sheet for Patients:  EntrepreneurPulse.com.au  Fact Sheet for Healthcare Providers:  IncredibleEmployment.be  This test is no t yet approved or cleared by the [place] FDA and  has been authorized for detection and/or diagnosis of SARS-CoV-2 by FDA under an Emergency Use  Authorization (EUA). This EUA will remain  in effect (meaning this test can be used) for the duration of the COVID-19 declaration under Section 564(b)(1) of the Act, 21 U.S.C.section  360bbb-3(b)(1), unless the authorization is terminated  or revoked sooner.       Influenza A by PCR NEGATIVE NEGATIVE Final   Influenza B by PCR NEGATIVE NEGATIVE Final    Comment: (NOTE) The Xpert Xpress SARS-CoV-2/FLU/RSV plus assay is intended as an aid in the diagnosis of influenza from Nasopharyngeal swab specimens and should not be used as a sole basis for treatment. Nasal washings and aspirates are unacceptable for Xpert Xpress SARS-CoV-2/FLU/RSV testing.  Fact Sheet for Patients: EntrepreneurPulse.com.au  Fact Sheet for Healthcare Providers: IncredibleEmployment.be  This test is not yet approved or cleared by the [place] FDA and has been authorized for detection and/or diagnosis of SARS-CoV-2 by FDA under an Emergency Use Authorization (EUA). This EUA will remain in effect (meaning this test can be used) for the duration of the COVID-19 declaration under Section 564(b)(1) of the Act, 21 U.S.C. section 360bbb-3(b)(1), unless the authorization is terminated or revoked.  Performed at [facility], [place] [address] [place], [place] [address]   Blood Culture (routine x 2)     Status: Abnormal   Collection Time: 07/01/21  3:40 PM   Specimen: BLOOD LEFT ARM  Result Value Ref Range Status   Specimen Description BLOOD LEFT ARM  Final   Special Requests   Final    BOTTLES DRAWN AEROBIC AND ANAEROBIC Blood Culture adequate volume   Culture  Setup Time   Final    GRAM POSITIVE COCCI IN CHAINS IN BOTH AEROBIC AND ANAEROBIC BOTTLES CRITICAL VALUE NOTED.  VALUE IS CONSISTENT WITH PREVIOUSLY REPORTED AND CALLED VALUE.    Culture (A)  Final    STREPTOCOCCUS DYSGALACTIAE SUSCEPTIBILITIES PERFORMED ON PREVIOUS CULTURE WITHIN THE LAST 5 DAYS. Performed at  [facility], [place] [address] [place], [place] [address]    Report Status 07/04/2021 FINAL  Final    Studies/Results: No results found.   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  TTE pending.  Repeat BCx pending Available as needed.          Bobby Rumpf MD, FACP Infectious Diseases (pager) [phone] [email or web address] 07/10/2021, 8:57 AM  LOS: 9 days

## 2021-07-10 NOTE — Progress Notes (Signed)
Speech Language Pathology Treatment: Dysphagia  Patient Details Name: Ryan [name] MRN: [id number] DOB: 07/19/1946 Today's Date: 07/10/2021 Time: 3818-2993 SLP Time Calculation (min) (ACUTE ONLY): 13 min  Assessment / Plan / Recommendation Clinical Impression  Pt seen with am meal. Tolerates regular textures well. Pt is a self reported slow eater. He likes to mix soft and firm textures orally. Achieves adequate oral clearance. No signs of aspiration. Pt to continue regular diet and thin liquids. SLP will sign off.   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  Pt presented to  ED from skilled nursing facility due to reported altered mental status.  Pt has a hx of dysphagia and was most recently seen by ST on 05/19/21 with recommendations for Dysphagia 2 solids and thin liquids.      SLP Plan  All goals met      Recommendations for follow up therapy are one component of a multi-disciplinary discharge planning process, led by the attending physician.  Recommendations may be updated based on patient status, additional functional criteria and insurance authorization.    Recommendations  Diet recommendations: Regular;Thin liquid Liquids provided via: Cup;Straw Medication Administration: Whole meds with liquid Supervision: Patient able to self feed Compensations: Follow solids with liquid Postural Changes and/or Swallow Maneuvers: Seated upright 90 degrees                Follow up Recommendations: None Plan: All goals met       GO                Bettyjean Stefanski, Katherene Ponto  07/10/2021, 10:17 AM

## 2021-07-11 DIAGNOSIS — I10 Essential (primary) hypertension: Secondary | ICD-10-CM | POA: Diagnosis not present

## 2021-07-11 DIAGNOSIS — R6520 Severe sepsis without septic shock: Secondary | ICD-10-CM | POA: Diagnosis not present

## 2021-07-11 DIAGNOSIS — A408 Other streptococcal sepsis: Secondary | ICD-10-CM | POA: Diagnosis not present

## 2021-07-11 DIAGNOSIS — R401 Stupor: Secondary | ICD-10-CM | POA: Diagnosis not present

## 2021-07-11 LAB — HAPTOGLOBIN: Haptoglobin: 267 mg/dL (ref 34–355)

## 2021-07-11 LAB — BASIC METABOLIC PANEL
Anion gap: 6 (ref 5–15)
BUN: 13 mg/dL (ref 8–23)
CO2: 22 mmol/L (ref 22–32)
Calcium: 7.9 mg/dL — ABNORMAL LOW (ref 8.9–10.3)
Chloride: 111 mmol/L (ref 98–111)
Creatinine, Ser: 1.04 mg/dL (ref 0.61–1.24)
GFR, Estimated: >60 mL/min (ref >60)
Glucose, Bld: 91 mg/dL (ref 70–99)
Potassium: 4 mmol/L (ref 3.5–5.1)
Sodium: 139 mmol/L (ref 135–145)

## 2021-07-11 LAB — CBC
HCT: 22.4 % — ABNORMAL LOW (ref 39.0–52.0)
Hemoglobin: 7.1 g/dL — ABNORMAL LOW (ref 13.0–17.0)
MCH: 22.3 pg — ABNORMAL LOW (ref 26.0–34.0)
MCHC: 31.7 g/dL (ref 30.0–36.0)
MCV: 70.4 fL — ABNORMAL LOW (ref 80.0–100.0)
Platelets: 246 10*3/uL (ref 150–400)
RBC: 3.18 MIL/uL — ABNORMAL LOW (ref 4.22–5.81)
RDW: 25.5 % — ABNORMAL HIGH (ref 11.5–15.5)
WBC: 11.8 10*3/uL — ABNORMAL HIGH (ref 4.0–10.5)
nRBC: 0 % (ref 0.0–0.2)

## 2021-07-11 LAB — GLUCOSE, CAPILLARY
Glucose-Capillary: 100 mg/dL — ABNORMAL HIGH (ref 70–99)
Glucose-Capillary: 102 mg/dL — ABNORMAL HIGH (ref 70–99)
Glucose-Capillary: 105 mg/dL — ABNORMAL HIGH (ref 70–99)
Glucose-Capillary: 116 mg/dL — ABNORMAL HIGH (ref 70–99)
Glucose-Capillary: 99 mg/dL (ref 70–99)

## 2021-07-11 MED ORDER — APIXABAN 5 MG PO TABS
5.0000 mg | ORAL_TABLET | Freq: Two times a day (BID) | ORAL | Status: DC
  Administered 2021-07-11 – 2021-07-14 (×7): 5 mg via ORAL
  Filled 2021-07-11 (×7): qty 1

## 2021-07-11 NOTE — Progress Notes (Signed)
Progress Note    Ryan [name]  TJQ:[id number] DOB: 1945/11/17  DOA: 07/01/2021 PCP: [name], MD    Brief Narrative:     Medical records reviewed and are as summarized below:  Ryan [name] is an 75 y.o. male with hx of chronic systolic CHF EF 76%, HTN, DM2, CKD stage 3, PVD s/p right AKA, ischemic left leg needing amputation but family refusing, hx of PE on Eliquis, hx of stroke, hx of retroperitoneal hematoma requiring left-sided nephrostomy tube due to urinary obstruction, who Presents to ER from SNF due to reported altered mental status.  Found to be bacteremic.  He is s/p left AKA.      Assessment/Plan:   Principal Problem:   Altered mental status Active Problems:   Sepsis with acute renal failure without septic shock (HCC)   Chronic systolic heart failure (HCC) - LVEF 25 to 30%   Type 2 diabetes mellitus (HCC)   Essential hypertension   AKI (acute kidney injury) (HCC)   Stage 3 chronic kidney disease (HCC) - baseline SCr 1.1   PAD (peripheral artery disease) ([place])   Pulmonary emboli 04/2021   Nephrostomy status (HCC)   Sepsis/Streptococcus bacteremia -Patient presented with sepsis with acute kidney injury -Likely source of infection is left foot cellulitis, infected wounds -Blood culture growing Streptococcus dysagalactiae started on ceftriaxone- change to PCN -s/p left AKA -ID consult appreciated: Opat done, continuous infusion PEN at SNF Can get repeat BCx done by primary 1 week post completing anbx.  TTE pending.  Repeat BCx pending  -repeat BC NGTD -echo: Left ventricular ejection fraction, by estimation, is 25 to 30%. The  left ventricle has severely decreased function. The left ventricle  demonstrates global hypokinesis. The left ventricular internal cavity size  was mildly dilated. There is moderate  left ventricular hypertrophy.   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  -Amiodarone 200 mg p.o. daily  - restart Eliquis    Hypokalemia -replete   Family Communication/Anticipated D/C date and plan/Code Status   DVT prophylaxis: Lovenox ordered. Code Status: Full Code.  Disposition Plan: Status is: Inpatient  Back to SNF once arrangements are made for IV abx and he will need IV access like midline or PICC         Medical Consultants:   Vascular ID  Subjective:   No SOB, no CP- visiting with sister at bedside  Objective:  Vitals:   07/10/21 0804 07/10/21 2029 07/11/21 0439 07/11/21 1213  BP: (!) 117/56 (!) 120/55 116/64 (!) 105/58  Pulse: 71 71 71 89  Resp: 18 20  17   Temp: 98.8 F (37.1 C) 99 F (37.2 C) 98.3 F (36.8 C) 98.9 F (37.2 C)  TempSrc: Oral Oral Oral   SpO2: 100% 98% 96% 96%  Weight:   58.6  kg   Height:        Intake/Output Summary (Last 24 hours) at 07/11/2021 1235 Last data filed at 07/11/2021 0530 Gross per 24 hour  Intake 1789.53 ml  Output 500 ml  Net 1289.53 ml   Filed Weights   07/07/21 0931 07/10/21 0500 07/11/21 0439  Weight: 60.3 kg 59 kg 58.6 kg    Exam:    General: Appearance:    Thin male in no acute distress     Lungs:      respirations unlabored  Heart:    Normal heart rate.    MS:   Left above knee amputation noted. Right above knee amputation noted.   Neurologic:   Awake, alert          Data Reviewed:   I have personally reviewed following labs and imaging studies:  Labs: Labs show the following:   Basic Metabolic Panel: Recent Labs  Lab 07/07/21 1118 07/08/21 0253 07/09/21 0353 07/10/21 0254 07/11/21 0147  NA 146* 144 144 142 139  K 3.4* 4.1 3.1* 3.6 4.0  CL  --  113* 114* 115* 111  CO2  --  21* 23 22 22   GLUCOSE  --  102* 100* 120* 91  BUN  --  13 13 13 13   CREATININE  --  1.14 1.16 1.04 1.04  CALCIUM  --  7.8* 7.9* 7.9* 7.9*   GFR Estimated Creatinine Clearance: 49.3 mL/min (by C-G formula based on SCr of 1.04 mg/dL). Liver Function Tests: No results for input(s): AST, ALT, ALKPHOS, BILITOT, PROT, ALBUMIN in the last 168 hours.  No results for input(s): LIPASE, AMYLASE in the last 168 hours. No results for input(s): AMMONIA in the last 168 hours. Coagulation profile No results for input(s): INR, PROTIME in the last 168 hours.   CBC: Recent Labs  Lab 07/07/21 1118 07/08/21 0253 07/09/21 0353 07/10/21 0254 07/11/21 0147  WBC  --  16.4* 12.4* 12.0* 11.8*  HGB 8.8* 7.2* 6.7* 7.2* 7.1*  HCT 26.0* 23.5* 21.7* 23.2* 22.4*  MCV  --  71.2* 70.0* 70.9* 70.4*  PLT  --  185 203 220 246   Cardiac Enzymes: No results for input(s): CKTOTAL, CKMB, CKMBINDEX, TROPONINI in the last 168 hours. BNP (last 3 results) Recent Labs    08/25/20 1121 03/27/21 1123  PROBNP 21,395* 3,146*   CBG: Recent Labs  Lab  07/10/21 2023 07/10/21 2228 07/11/21 0529 07/11/21 0745 07/11/21 1118  GLUCAP 102* 126* 99 100* 102*   D-Dimer: No results for input(s): DDIMER in the last 72 hours. Hgb A1c: No results for input(s): HGBA1C in the last 72 hours. Lipid Profile: No results for input(s): CHOL, HDL, LDLCALC, TRIG, CHOLHDL, LDLDIRECT in the last 72 hours. Thyroid function studies: No results for input(s): TSH, T4TOTAL, T3FREE, THYROIDAB in the last 72 hours.  Invalid input(s): FREET3 Anemia work up: No results for input(s): VITAMINB12, FOLATE, FERRITIN, TIBC, IRON, RETICCTPCT in the last 72 hours. Sepsis Labs: Recent Labs  Lab 07/08/21 0253 07/09/21 0353 07/10/21 0254 07/11/21 0147  WBC 16.4* 12.4* 12.0* 11.8*    Microbiology Recent Results (from the  past 240 hour(s))  Urine Culture     Status: Abnormal   Collection Time: 07/01/21  3:05 PM   Specimen: Urine, Catheterized  Result Value Ref Range Status   Specimen Description URINE, CATHETERIZED  Final   Special Requests   Final    NONE Performed at [facility], [address] [place], [place] [address]    Culture >=100,000 COLONIES/mL HAFNIA ALVEI (A)  Final   Report Status 07/06/2021 FINAL  Final   Organism ID, Bacteria HAFNIA ALVEI (A)  Final      Susceptibility   Hafnia alvei - MIC*    AMPICILLIN >=32 RESISTANT Resistant     CEFAZOLIN >=64 RESISTANT Resistant     CEFTRIAXONE >=64 RESISTANT Resistant     CIPROFLOXACIN <=0.25 SENSITIVE Sensitive     GENTAMICIN <=1 SENSITIVE Sensitive     IMIPENEM <=0.25 SENSITIVE Sensitive     NITROFURANTOIN 64 INTERMEDIATE Intermediate     TRIMETH/SULFA <=20 SENSITIVE Sensitive     AMPICILLIN/SULBACTAM >=32 RESISTANT Resistant     PIP/TAZO >=128 RESISTANT Resistant     * >=100,000 COLONIES/mL HAFNIA ALVEI  Blood Culture (routine x 2)     Status: Abnormal   Collection Time: 07/01/21  3:08 PM   Specimen: BLOOD  Result Value Ref Range Status   Specimen Description BLOOD SITE NOT SPECIFIED   Final   Special Requests   Final    BOTTLES DRAWN AEROBIC AND ANAEROBIC Blood Culture adequate volume   Culture  Setup Time   Final    GRAM POSITIVE COCCI IN CHAINS IN BOTH AEROBIC AND ANAEROBIC BOTTLES CRITICAL RESULT CALLED TO, READ BACK BY AND VERIFIED WITH: PHARMD C.AMEND AT [place] 07/02/2021 BY [place]. Performed at [facility], [place] [address] [place], [place] [address]    Culture STREPTOCOCCUS DYSGALACTIAE (A)  Final   Report Status 07/04/2021 FINAL  Final   Organism ID, Bacteria STREPTOCOCCUS DYSGALACTIAE  Final      Susceptibility   Streptococcus dysgalactiae - MIC*    CLINDAMYCIN <=0.25 SENSITIVE Sensitive     AMPICILLIN <=0.25 SENSITIVE Sensitive     ERYTHROMYCIN <=0.12 SENSITIVE Sensitive     VANCOMYCIN 0.5 SENSITIVE Sensitive     CEFTRIAXONE <=0.12 SENSITIVE Sensitive     LEVOFLOXACIN 0.5 SENSITIVE Sensitive     PENICILLIN Value in next row Sensitive      SENSITIVE<=0.06    * STREPTOCOCCUS DYSGALACTIAE  Blood Culture ID Panel (Reflexed)     Status: Abnormal   Collection Time: 07/01/21  3:08 PM  Result Value Ref Range Status   Enterococcus faecalis NOT DETECTED NOT DETECTED Final   Enterococcus Faecium NOT DETECTED NOT DETECTED Final   Listeria monocytogenes NOT DETECTED NOT DETECTED Final   Staphylococcus species NOT DETECTED NOT DETECTED Final   Staphylococcus aureus (BCID) NOT DETECTED NOT DETECTED Final   Staphylococcus epidermidis NOT DETECTED NOT DETECTED Final   Staphylococcus lugdunensis NOT DETECTED NOT DETECTED Final   Streptococcus species DETECTED (A) NOT DETECTED Final    Comment: Not Enterococcus species, Streptococcus agalactiae, Streptococcus pyogenes, or Streptococcus pneumoniae. CRITICAL RESULT CALLED TO, READ BACK BY AND VERIFIED WITH: C AMEND,[email or web address]  06/29/21 [place]    Streptococcus agalactiae NOT DETECTED NOT DETECTED Final   Streptococcus pneumoniae NOT DETECTED NOT DETECTED Final   Streptococcus pyogenes NOT DETECTED NOT DETECTED Final    A.calcoaceticus-baumannii NOT DETECTED NOT DETECTED Final   Bacteroides fragilis NOT DETECTED NOT DETECTED Final   Enterobacterales NOT DETECTED NOT DETECTED Final   Enterobacter cloacae complex NOT  DETECTED NOT DETECTED Final   Escherichia coli NOT DETECTED NOT DETECTED Final   Klebsiella aerogenes NOT DETECTED NOT DETECTED Final   Klebsiella oxytoca NOT DETECTED NOT DETECTED Final   Klebsiella pneumoniae NOT DETECTED NOT DETECTED Final   Proteus species NOT DETECTED NOT DETECTED Final   Salmonella species NOT DETECTED NOT DETECTED Final   Serratia marcescens NOT DETECTED NOT DETECTED Final   Haemophilus influenzae NOT DETECTED NOT DETECTED Final   Neisseria meningitidis NOT DETECTED NOT DETECTED Final   Pseudomonas aeruginosa NOT DETECTED NOT DETECTED Final   Stenotrophomonas maltophilia NOT DETECTED NOT DETECTED Final   Candida albicans NOT DETECTED NOT DETECTED Final   Candida auris NOT DETECTED NOT DETECTED Final   Candida glabrata NOT DETECTED NOT DETECTED Final   Candida krusei NOT DETECTED NOT DETECTED Final   Candida parapsilosis NOT DETECTED NOT DETECTED Final   Candida tropicalis NOT DETECTED NOT DETECTED Final   Cryptococcus neoformans/gattii NOT DETECTED NOT DETECTED Final    Comment: Performed at [facility], [place] [address] [place], [place] [address]  Resp Panel by RT-PCR (Flu A&B, Covid) Nasopharyngeal Swab     Status: None   Collection Time: 07/01/21  3:15 PM   Specimen: Nasopharyngeal Swab; Nasopharyngeal(NP) swabs in vial transport medium  Result Value Ref Range Status   SARS Coronavirus 2 by RT PCR NEGATIVE NEGATIVE Final    Comment: (NOTE) SARS-CoV-2 target nucleic acids are NOT DETECTED.  The SARS-CoV-2 RNA is generally detectable in upper respiratory specimens during the acute phase of infection. The lowest concentration of SARS-CoV-2 viral copies this assay can detect is 138 copies/mL. A negative result does not preclude SARS-Cov-2 infection and  should not be used as the sole basis for treatment or other patient management decisions. A negative result may occur with  improper specimen collection/handling, submission of specimen other than nasopharyngeal swab, presence of viral mutation(s) within the areas targeted by this assay, and inadequate number of viral copies(<138 copies/mL). A negative result must be combined with clinical observations, patient history, and epidemiological information. The expected result is Negative.  Fact Sheet for Patients:  EntrepreneurPulse.com.au  Fact Sheet for Healthcare Providers:  IncredibleEmployment.be  This test is no t yet approved or cleared by the [place] FDA and  has been authorized for detection and/or diagnosis of SARS-CoV-2 by FDA under an Emergency Use Authorization (EUA). This EUA will remain  in effect (meaning this test can be used) for the duration of the COVID-19 declaration under Section 564(b)(1) of the Act, 21 U.S.C.section 360bbb-3(b)(1), unless the authorization is terminated  or revoked sooner.       Influenza A by PCR NEGATIVE NEGATIVE Final   Influenza B by PCR NEGATIVE NEGATIVE Final    Comment: (NOTE) The Xpert Xpress SARS-CoV-2/FLU/RSV plus assay is intended as an aid in the diagnosis of influenza from Nasopharyngeal swab specimens and should not be used as a sole basis for treatment. Nasal washings and aspirates are unacceptable for Xpert Xpress SARS-CoV-2/FLU/RSV testing.  Fact Sheet for Patients: EntrepreneurPulse.com.au  Fact Sheet for Healthcare Providers: IncredibleEmployment.be  This test is not yet approved or cleared by the [place] FDA and has been authorized for detection and/or diagnosis of SARS-CoV-2 by FDA under an Emergency Use Authorization (EUA). This EUA will remain in effect (meaning this test can be used) for the duration of the COVID-19 declaration  under Section 564(b)(1) of the Act, 21 U.S.C. section 360bbb-3(b)(1), unless the authorization is terminated or revoked.  Performed at [facility], [name] [address] [place], [place] [address]   Blood Culture (routine x 2)     Status: Abnormal   Collection Time: 07/01/21  3:40 PM   Specimen: BLOOD LEFT ARM  Result Value Ref Range Status   Specimen Description BLOOD LEFT ARM  Final   Special Requests   Final    BOTTLES DRAWN AEROBIC AND ANAEROBIC Blood Culture adequate volume   Culture  Setup Time   Final    GRAM POSITIVE COCCI IN CHAINS IN BOTH AEROBIC AND ANAEROBIC BOTTLES CRITICAL VALUE NOTED.  VALUE IS CONSISTENT WITH PREVIOUSLY REPORTED AND CALLED VALUE.    Culture (A)  Final    STREPTOCOCCUS DYSGALACTIAE SUSCEPTIBILITIES PERFORMED ON PREVIOUS CULTURE WITHIN THE LAST 5 DAYS. Performed at [facility], [place] [address], [place], [place] [address]    Report Status 07/04/2021 FINAL  Final  Culture, blood (Routine X 2) w Reflex to ID Panel     Status: None (Preliminary result)   Collection Time: 07/09/21  3:58 PM   Specimen: BLOOD LEFT FOREARM  Result Value Ref Range Status   Specimen Description BLOOD LEFT FOREARM  Final   Special Requests   Final    BOTTLES DRAWN AEROBIC AND ANAEROBIC Blood Culture adequate volume   Culture   Final    NO GROWTH 2 DAYS Performed at [facility], [place] [address], [place], [place] [address]    Report Status PENDING  Incomplete    Procedures and diagnostic studies:  ECHOCARDIOGRAM LIMITED  Result Date: 07/10/2021    ECHOCARDIOGRAM LIMITED REPORT   Patient Name:   Ryan [name] Date of Exam: 07/10/2021 Medical Rec #:  [id number]        Height:       72.0 in Accession #:    [id number]       Weight:       130.1 lb Date of Birth:  24-Jan-1946        BSA:          1.775 m Patient Age:    69 years         BP:           117/56 mmHg Patient Gender: M                HR:           72 bpm. Exam Location:  Inpatient Procedure: Limited  Echo, Limited Color Doppler and Cardiac Doppler Indications:    Bacteremia  History:        Patient has prior history of Echocardiogram examinations, most                 recent 05/05/2021. CHF, Defibrillator and Pacemaker, chronic                 kidney disease.; Risk Factors:Hypertension and Diabetes.                  Mitral Valve: 27 mm bioprosthetic valve valve is present in the                 mitral position.  Sonographer:    Johny Chess RDCS Referring Phys: [place]  1. Left ventricular ejection fraction, by estimation, is 25 to 30%. The left ventricle has severely decreased function. The left ventricle demonstrates global hypokinesis. The left ventricular internal cavity size was mildly dilated. There is moderate left ventricular hypertrophy.  2. Right ventricular systolic function is moderately reduced.  The right ventricular size is mildly enlarged. There is mildly elevated pulmonary artery systolic pressure.  3. Left atrial size was moderately dilated.  4. Right atrial size was mildly dilated.  5. The mitral valve has been repaired/replaced. Trivial mitral valve regurgitation. No evidence of mitral stenosis. There is a 27 mm bioprosthetic valve present in the mitral position.  6. The aortic valve is tricuspid. Aortic valve regurgitation is not visualized. Mild aortic valve sclerosis is present, with no evidence of aortic valve stenosis.  7. Aortic dilatation noted. There is borderline dilatation of the aortic root, measuring 39 mm.  8. The inferior vena cava is normal in size with greater than 50% respiratory variability, suggesting right atrial pressure of 3 mmHg. FINDINGS  Left Ventricle: Left ventricular ejection fraction, by estimation, is 25 to 30%. The left ventricle has severely decreased function. The left ventricle demonstrates global hypokinesis. The left ventricular internal cavity size was mildly dilated. There is moderate left ventricular hypertrophy. Right  Ventricle: The right ventricular size is mildly enlarged. Right ventricular systolic function is moderately reduced. There is mildly elevated pulmonary artery systolic pressure. The tricuspid regurgitant velocity is 2.92 m/s, and with an assumed right atrial pressure of 3 mmHg, the estimated right ventricular systolic pressure is 36.1 mmHg. Left Atrium: Left atrial size was moderately dilated. Right Atrium: Right atrial size was mildly dilated. Pericardium: There is no evidence of pericardial effusion. Mitral Valve: The mitral valve has been repaired/replaced. Trivial mitral valve regurgitation. There is a 27 mm bioprosthetic valve present in the mitral position. No evidence of mitral valve stenosis. MV peak gradient, 14.6 mmHg. The mean mitral valve gradient is 6.5 mmHg. Tricuspid Valve: The tricuspid valve is normal in structure. Tricuspid valve regurgitation is mild . No evidence of tricuspid stenosis. Aortic Valve: The aortic valve is tricuspid. Aortic valve regurgitation is not visualized. Mild aortic valve sclerosis is present, with no evidence of aortic valve stenosis. Pulmonic Valve: The pulmonic valve was normal in structure. Pulmonic valve regurgitation is trivial. No evidence of pulmonic stenosis. Aorta: Aortic dilatation noted. There is borderline dilatation of the aortic root, measuring 39 mm. Venous: The inferior vena cava is normal in size with greater than 50% respiratory variability, suggesting right atrial pressure of 3 mmHg. IAS/Shunts: No atrial level shunt detected by color flow Doppler. Additional Comments: A device lead is visualized. There is a small pleural effusion in the left lateral region. LEFT VENTRICLE PLAX 2D LVIDd:         5.70 cm LVIDs:         5.10 cm LV PW:         1.40 cm LV IVS:        1.40 cm LVOT diam:     2.40 cm LV SV:         48 LV SV Index:   27 LVOT Area:     4.52 cm  IVC IVC diam: 1.50 cm LEFT ATRIUM         Index LA diam:    4.70 cm 2.65 cm/m  AORTIC VALVE LVOT Vmax:    59.20 cm/s LVOT Vmean:  36.900 cm/s LVOT VTI:    0.107 m  AORTA Ao Root diam: 3.90 cm MITRAL VALVE              TRICUSPID VALVE MV Area VTI:  1.14 cm    TR Peak grad:   34.1 mmHg MV Peak grad: 14.6 mmHg   TR Vmax:  292.00 cm/s MV Mean grad: 6.5 mmHg MV Vmax:      1.91 m/s    SHUNTS MV Vmean:     115.0 cm/s  Systemic VTI:  0.11 m                           Systemic Diam: 2.40 cm Kirk Ruths MD Electronically signed by Kirk Ruths MD Signature Date/Time: 07/10/2021/3:43:40 PM    Final     Medications:    amiodarone  200 mg Oral Daily   furosemide  20 mg Oral Daily   influenza vaccine adjuvanted  0.5 mL Intramuscular Tomorrow-1000   insulin aspart  0-9 Units Subcutaneous Q6H   midodrine  10 mg Oral TID WC   Continuous Infusions:  penicillin g continuous IV infusion 8 Million Units (07/10/21 2322)     LOS: 10 days   Geradine Girt  [facility]   How to contact the [facility] Attending or Consulting provider [place] or covering provider during after hours [place], for this patient?  Check the care team in [facility] and look for a) attending/consulting [facility] provider listed and b) the [facility] team listed Log into [email or web address] and use [place]'s universal password to access. If you do not have the password, please contact the hospital operator. Locate the [facility] provider you are looking for under [facility] and page to a number that you can be directly reached. If you still have difficulty reaching the provider, please page the [facility] (Director on Call) for the Hospitalists listed on amion for assistance.  07/11/2021, 12:35 PM

## 2021-07-11 NOTE — Progress Notes (Signed)
   VASCULAR SURGERY ASSESSMENT & PLAN:   POD 4 LEFT ABOVE-THE-KNEE AMPUTATION: His amputation site is healing nicely.  He has a stump shrinker.  Vascular surgery following peripherally.   SUBJECTIVE:   No specific complaints.  PHYSICAL EXAM:   Vitals:   07/10/21 0500 07/10/21 0804 07/10/21 2029 07/11/21 0439  BP:  (!) 117/56 (!) 120/55 116/64  Pulse:  71 71 71  Resp:  18 20   Temp:  98.8 F (37.1 C) 99 F (37.2 C) 98.3 F (36.8 C)  TempSrc:  Oral Oral Oral  SpO2:  100% 98% 96%  Weight: 59 kg   58.6 kg  Height:       I inspected his wound which looks good.  His stump shrinker was reapplied.  LABS:   Lab Results  Component Value Date   WBC 11.8 (H) 07/11/2021   HGB 7.1 (L) 07/11/2021   HCT 22.4 (L) 07/11/2021   MCV 70.4 (L) 07/11/2021   PLT 246 07/11/2021   Lab Results  Component Value Date   CREATININE 1.04 07/11/2021   Lab Results  Component Value Date   INR 2.5 (H) 07/01/2021   CBG (last 3)  Recent Labs    07/10/21 2228 07/11/21 0529 07/11/21 0745  GLUCAP 126* 99 100*    PROBLEM LIST:    Principal Problem:   Altered mental status Active Problems:   Sepsis with acute renal failure without septic shock (HCC)   Chronic systolic heart failure (HCC) - LVEF 25 to 30%   Type 2 diabetes mellitus (HCC)   Essential hypertension   AKI (acute kidney injury) ([place])   Stage 3 chronic kidney disease (HCC) - baseline SCr 1.1   PAD (peripheral artery disease) (HCC)   Pulmonary emboli 04/2021   Nephrostomy status (HCC)   CURRENT MEDS:    amiodarone  200 mg Oral Daily   furosemide  20 mg Oral Daily   influenza vaccine adjuvanted  0.5 mL Intramuscular Tomorrow-1000   insulin aspart  0-9 Units Subcutaneous Q6H   midodrine  10 mg Oral TID WC    Deitra Mayo Office: [phone] 07/11/2021

## 2021-07-11 NOTE — Progress Notes (Signed)
Patient cleaned and bed changed. Patient is in pain, PRN pain med given. Urostomy and sacral dressing changed.

## 2021-07-12 DIAGNOSIS — R401 Stupor: Secondary | ICD-10-CM | POA: Diagnosis not present

## 2021-07-12 DIAGNOSIS — I10 Essential (primary) hypertension: Secondary | ICD-10-CM | POA: Diagnosis not present

## 2021-07-12 LAB — CBC
HCT: 22.8 % — ABNORMAL LOW (ref 39.0–52.0)
Hemoglobin: 7.5 g/dL — ABNORMAL LOW (ref 13.0–17.0)
MCH: 23.1 pg — ABNORMAL LOW (ref 26.0–34.0)
MCHC: 32.9 g/dL (ref 30.0–36.0)
MCV: 70.2 fL — ABNORMAL LOW (ref 80.0–100.0)
Platelets: 314 10*3/uL (ref 150–400)
RBC: 3.25 MIL/uL — ABNORMAL LOW (ref 4.22–5.81)
RDW: 25.6 % — ABNORMAL HIGH (ref 11.5–15.5)
WBC: 11.8 10*3/uL — ABNORMAL HIGH (ref 4.0–10.5)
nRBC: 0 % (ref 0.0–0.2)

## 2021-07-12 LAB — GLUCOSE, CAPILLARY
Glucose-Capillary: 91 mg/dL (ref 70–99)
Glucose-Capillary: 87 mg/dL (ref 70–99)
Glucose-Capillary: 88 mg/dL (ref 70–99)
Glucose-Capillary: 67 mg/dL — ABNORMAL LOW (ref 70–99)
Glucose-Capillary: 85 mg/dL (ref 70–99)

## 2021-07-12 LAB — IRON AND TIBC
Iron: 24 ug/dL — ABNORMAL LOW (ref 45–182)
Saturation Ratios: 19 % (ref 17.9–39.5)
TIBC: 127 ug/dL — ABNORMAL LOW (ref 250–450)
UIBC: 103 ug/dL

## 2021-07-12 LAB — BASIC METABOLIC PANEL
Anion gap: 7 (ref 5–15)
BUN: 12 mg/dL (ref 8–23)
CO2: 22 mmol/L (ref 22–32)
Calcium: 7.9 mg/dL — ABNORMAL LOW (ref 8.9–10.3)
Chloride: 106 mmol/L (ref 98–111)
Creatinine, Ser: 0.93 mg/dL (ref 0.61–1.24)
GFR, Estimated: >60 mL/min (ref >60)
Glucose, Bld: 105 mg/dL — ABNORMAL HIGH (ref 70–99)
Potassium: 4 mmol/L (ref 3.5–5.1)
Sodium: 135 mmol/L (ref 135–145)

## 2021-07-12 LAB — FERRITIN: Ferritin: 628 ng/mL — ABNORMAL HIGH (ref 24–336)

## 2021-07-12 MED ORDER — SODIUM CHLORIDE 0.9 % IV SOLN
510.0000 mg | Freq: Once | INTRAVENOUS | Status: AC
  Administered 2021-07-12: 510 mg via INTRAVENOUS
  Filled 2021-07-12: qty 17

## 2021-07-12 NOTE — Progress Notes (Signed)
Progress Note    Ryan [name]  GDJ:[id number] DOB: [birth year]  DOA: 07/01/2021 PCP: [name], MD    Brief Narrative:     Medical records reviewed and are as summarized below:  Ryan [name] is an 75 y.o. male with hx of chronic systolic CHF EF 62%, HTN, DM2, CKD stage 3, PVD s/p right AKA, ischemic left leg needing amputation but family refusing, hx of PE on Eliquis, hx of stroke, hx of retroperitoneal hematoma requiring left-sided nephrostomy tube due to urinary obstruction, who Presents to ER from SNF due to reported altered mental status.  Found to be bacteremic.  He is s/p left AKA.      Assessment/Plan:   Principal Problem:   Altered mental status Active Problems:   Sepsis with acute renal failure without septic shock (HCC)   Chronic systolic heart failure (HCC) - LVEF 25 to 30%   Type 2 diabetes mellitus (HCC)   Essential hypertension   AKI (acute kidney injury) (HCC)   Stage 3 chronic kidney disease (HCC) - baseline SCr 1.1   PAD (peripheral artery disease) ([place])   Pulmonary emboli 04/2021   Nephrostomy status (HCC)   Sepsis/Streptococcus bacteremia -Patient presented with sepsis with acute kidney injury -Likely source of infection is left foot cellulitis, infected wounds -Blood culture growing Streptococcus dysagalactiae started on ceftriaxone- change to PCN -s/p left AKA -ID consult appreciated: Opat done, continuous infusion PEN at SNF Can get repeat BCx done by primary 1 week post completing anbx.   -repeat BC NGTD -echo: Left ventricular ejection fraction, by estimation, is 25 to 30%. The  left ventricle has severely decreased function. The left ventricle  demonstrates global hypokinesis. The left ventricular internal cavity size  was mildly dilated. There is moderate  left ventricular hypertrophy.   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  -Amiodarone 200 mg p.o. daily  - restart Eliquis    Hypokalemia -replete   Family Communication/Anticipated D/C date and plan/Code Status   DVT prophylaxis: eliquis Code Status: Full Code.  Disposition Plan: Status is: Inpatient  Back to SNF once arrangements are made for IV abx and he will need IV access like midline or PICC         Medical Consultants:   Vascular ID  Subjective:   Sleepy this AM but no complaints   Objective:  Vitals:   07/11/21 0439 07/11/21 1213 07/11/21 2053 07/12/21 0440  BP: 116/64 (!) 105/58 114/64 112/63  Pulse: 71 89 96 100  Resp:  17 20 20   Temp: 98.3 F (36.8 C) 98.9 F (37.2 C) 98.7 F (37.1 C) 98.2 F (36.8 C)  TempSrc: Oral  Oral Oral  SpO2: 96% 96% 100% 100%  Weight: 58.6 kg   58.5 kg  Height:         Intake/Output Summary (Last 24 hours) at 07/12/2021 1102 Last data filed at 07/12/2021 0000 Gross per 24 hour  Intake 1011.45 ml  Output 550 ml  Net 461.45 ml   Filed Weights   07/10/21 0500 07/11/21 0439 07/12/21 0440  Weight: 59 kg 58.6 kg 58.5 kg    Exam:   General: Appearance:    Thin male in no acute distress     Lungs:     respirations unlabored  Heart:    Tachycardic.   MS:   Left aboveknee amputation noted. Right above knee amputation noted.                Data Reviewed:   I have personally reviewed following labs and imaging studies:  Labs: Labs show the following:   Basic Metabolic Panel: Recent Labs  Lab 07/08/21 0253 07/09/21 0353 07/10/21 0254 07/11/21 0147 07/12/21 0302  NA 144 144 142 139 135  K 4.1 3.1* 3.6 4.0 4.0  CL 113* 114* 115* 111 106  CO2 21* 23 22 22 22   GLUCOSE 102* 100* 120* 91 105*  BUN 13 13 13 13 12   CREATININE 1.14 1.16 1.04 1.04 0.93  CALCIUM 7.8* 7.9* 7.9* 7.9* 7.9*   GFR Estimated Creatinine Clearance: 55 mL/min (by C-G formula based on SCr of 0.93 mg/dL). Liver Function Tests: No results for input(s): AST, ALT, ALKPHOS, BILITOT, PROT, ALBUMIN in the last 168 hours.  No results for input(s): LIPASE, AMYLASE in the last 168 hours. No results for input(s): AMMONIA in the last 168 hours. Coagulation profile No results for input(s): INR, PROTIME in the last 168 hours.   CBC: Recent Labs  Lab 07/08/21 0253 07/09/21 0353 07/10/21 0254 07/11/21 0147 07/12/21 0302  WBC 16.4* 12.4* 12.0* 11.8* 11.8*  HGB 7.2* 6.7* 7.2* 7.1* 7.5*  HCT 23.5* 21.7* 23.2* 22.4* 22.8*  MCV 71.2* 70.0* 70.9* 70.4* 70.2*  PLT 185 203 220 246 314   Cardiac Enzymes: No results for input(s): CKTOTAL, CKMB, CKMBINDEX, TROPONINI in the last 168 hours. BNP (last 3 results) Recent Labs    08/25/20 1121 03/27/21 1123  PROBNP 21,395* 3,146*   CBG: Recent Labs  Lab 07/11/21 1118 07/11/21 1606 07/11/21 2353 07/12/21 0629  07/12/21 0758  GLUCAP 102* 116* 105* 91 87   D-Dimer: No results for input(s): DDIMER in the last 72 hours. Hgb A1c: No results for input(s): HGBA1C in the last 72 hours. Lipid Profile: No results for input(s): CHOL, HDL, LDLCALC, TRIG, CHOLHDL, LDLDIRECT in the last 72 hours. Thyroid function studies: No results for input(s): TSH, T4TOTAL, T3FREE, THYROIDAB in the last 72 hours.  Invalid input(s): FREET3 Anemia work up: Recent Labs    07/12/21 0302  FERRITIN 628*  TIBC 127*  IRON 24*   Sepsis Labs: Recent Labs  Lab 07/09/21 0353 07/10/21 0254 07/11/21 0147 07/12/21 0302  WBC 12.4* 12.0* 11.8* 11.8*    Microbiology Recent Results (from the past 240 hour(s))  Culture, blood (Routine X 2) w Reflex to ID Panel     Status: None (Preliminary result)  Collection Time: 07/09/21  3:58 PM   Specimen: BLOOD LEFT FOREARM  Result Value Ref Range Status   Specimen Description BLOOD LEFT FOREARM  Final   Special Requests   Final    BOTTLES DRAWN AEROBIC AND ANAEROBIC Blood Culture adequate volume   Culture   Final    NO GROWTH 2 DAYS Performed at [facility], [address] [place], [place] [address]    Report Status PENDING  Incomplete    Procedures and diagnostic studies:  ECHOCARDIOGRAM LIMITED  Result Date: 07/10/2021    ECHOCARDIOGRAM LIMITED REPORT   Patient Name:   Ryan [name] Date of Exam: 07/10/2021 Medical Rec #:  [id number]        Height:       72.0 in Accession #:    [id number]       Weight:       130.1 lb Date of Birth:  Mar 23, 1946        BSA:          1.775 m Patient Age:    8 years         BP:           117/56 mmHg Patient Gender: M                HR:           72 bpm. Exam Location:  Inpatient Procedure: Limited Echo, Limited Color Doppler and Cardiac Doppler Indications:    Bacteremia  History:        Patient has prior history of Echocardiogram examinations, most                 recent 05/05/2021. CHF, Defibrillator and Pacemaker, chronic                  kidney disease.; Risk Factors:Hypertension and Diabetes.                  Mitral Valve: 27 mm bioprosthetic valve valve is present in the                 mitral position.  Sonographer:    Johny Chess RDCS Referring Phys: [place]  1. Left ventricular ejection fraction, by estimation, is 25 to 30%. The left ventricle has severely decreased function. The left ventricle demonstrates global hypokinesis. The left ventricular internal cavity size was mildly dilated. There is moderate left ventricular hypertrophy.  2. Right ventricular systolic function is moderately reduced. The right ventricular size is mildly enlarged. There is mildly elevated pulmonary artery systolic pressure.  3. Left atrial size was moderately dilated.  4. Right atrial size was mildly dilated.  5. The mitral valve has been repaired/replaced. Trivial mitral valve regurgitation. No evidence of mitral stenosis. There is a 27 mm bioprosthetic valve present in the mitral position.  6. The aortic valve is tricuspid. Aortic valve regurgitation is not visualized. Mild aortic valve sclerosis is present, with no evidence of aortic valve stenosis.  7. Aortic dilatation noted. There is borderline dilatation of the aortic root, measuring 39 mm.  8. The inferior vena cava is normal in size with greater than 50% respiratory variability, suggesting right atrial pressure of 3 mmHg. FINDINGS  Left Ventricle: Left ventricular ejection fraction, by estimation, is 25 to 30%. The left ventricle has severely decreased function. The left ventricle demonstrates global hypokinesis. The left ventricular internal cavity size was mildly dilated. There is moderate left ventricular hypertrophy. Right Ventricle: The right  ventricular size is mildly enlarged. Right ventricular systolic function is moderately reduced. There is mildly elevated pulmonary artery systolic pressure. The tricuspid regurgitant velocity is 2.92 m/s, and with an assumed  right atrial pressure of 3 mmHg, the estimated right ventricular systolic pressure is 16.1 mmHg. Left Atrium: Left atrial size was moderately dilated. Right Atrium: Right atrial size was mildly dilated. Pericardium: There is no evidence of pericardial effusion. Mitral Valve: The mitral valve has been repaired/replaced. Trivial mitral valve regurgitation. There is a 27 mm bioprosthetic valve present in the mitral position. No evidence of mitral valve stenosis. MV peak gradient, 14.6 mmHg. The mean mitral valve gradient is 6.5 mmHg. Tricuspid Valve: The tricuspid valve is normal in structure. Tricuspid valve regurgitation is mild . No evidence of tricuspid stenosis. Aortic Valve: The aortic valve is tricuspid. Aortic valve regurgitation is not visualized. Mild aortic valve sclerosis is present, with no evidence of aortic valve stenosis. Pulmonic Valve: The pulmonic valve was normal in structure. Pulmonic valve regurgitation is trivial. No evidence of pulmonic stenosis. Aorta: Aortic dilatation noted. There is borderline dilatation of the aortic root, measuring 39 mm. Venous: The inferior vena cava is normal in size with greater than 50% respiratory variability, suggesting right atrial pressure of 3 mmHg. IAS/Shunts: No atrial level shunt detected by color flow Doppler. Additional Comments: A device lead is visualized. There is a small pleural effusion in the left lateral region. LEFT VENTRICLE PLAX 2D LVIDd:         5.70 cm LVIDs:         5.10 cm LV PW:         1.40 cm LV IVS:        1.40 cm LVOT diam:     2.40 cm LV SV:         48 LV SV Index:   27 LVOT Area:     4.52 cm  IVC IVC diam: 1.50 cm LEFT ATRIUM         Index LA diam:    4.70 cm 2.65 cm/m  AORTIC VALVE LVOT Vmax:   59.20 cm/s LVOT Vmean:  36.900 cm/s LVOT VTI:    0.107 m  AORTA Ao Root diam: 3.90 cm MITRAL VALVE              TRICUSPID VALVE MV Area VTI:  1.14 cm    TR Peak grad:   34.1 mmHg MV Peak grad: 14.6 mmHg   TR Vmax:        292.00 cm/s MV Mean  grad: 6.5 mmHg MV Vmax:      1.91 m/s    SHUNTS MV Vmean:     115.0 cm/s  Systemic VTI:  0.11 m                           Systemic Diam: 2.40 cm Kirk Ruths MD Electronically signed by Kirk Ruths MD Signature Date/Time: 07/10/2021/3:43:40 PM    Final     Medications:    amiodarone  200 mg Oral Daily   apixaban  5 mg Oral Q12H   furosemide  20 mg Oral Daily   influenza vaccine adjuvanted  0.5 mL Intramuscular Tomorrow-1000   insulin aspart  0-9 Units Subcutaneous Q6H   midodrine  10 mg Oral TID WC   Continuous Infusions:  penicillin g continuous IV infusion 8 Million Units (07/11/21 2348)     LOS: 11 days   Geradine Girt  [facility]  How to contact the [facility] Attending or Consulting provider [place] or covering provider during after hours [place], for this patient?  Check the care team in [facility] and look for a) attending/consulting [facility] provider listed and b) the [facility] team listed Log into [email or web address] and use [place]'s universal password to access. If you do not have the password, please contact the hospital operator. Locate the [facility] provider you are looking for under [facility] and page to a number that you can be directly reached. If you still have difficulty reaching the provider, please page the [facility] (Director on Call) for the Hospitalists listed on amion for assistance.  07/12/2021, 11:02 AM

## 2021-07-13 DIAGNOSIS — E1159 Type 2 diabetes mellitus with other circulatory complications: Secondary | ICD-10-CM | POA: Diagnosis not present

## 2021-07-13 DIAGNOSIS — I96 Gangrene, not elsewhere classified: Secondary | ICD-10-CM | POA: Diagnosis not present

## 2021-07-13 DIAGNOSIS — Z936 Other artificial openings of urinary tract status: Secondary | ICD-10-CM | POA: Diagnosis not present

## 2021-07-13 DIAGNOSIS — I5022 Chronic systolic (congestive) heart failure: Secondary | ICD-10-CM | POA: Diagnosis not present

## 2021-07-13 LAB — GLUCOSE, CAPILLARY
Glucose-Capillary: 104 mg/dL — ABNORMAL HIGH (ref 70–99)
Glucose-Capillary: 107 mg/dL — ABNORMAL HIGH (ref 70–99)
Glucose-Capillary: 108 mg/dL — ABNORMAL HIGH (ref 70–99)
Glucose-Capillary: 116 mg/dL — ABNORMAL HIGH (ref 70–99)
Glucose-Capillary: 86 mg/dL (ref 70–99)

## 2021-07-13 LAB — SURGICAL PATHOLOGY

## 2021-07-13 LAB — CBC
HCT: 25.1 % — ABNORMAL LOW (ref 39.0–52.0)
Hemoglobin: 7.8 g/dL — ABNORMAL LOW (ref 13.0–17.0)
MCH: 22.2 pg — ABNORMAL LOW (ref 26.0–34.0)
MCHC: 31.1 g/dL (ref 30.0–36.0)
MCV: 71.5 fL — ABNORMAL LOW (ref 80.0–100.0)
Platelets: 335 10*3/uL (ref 150–400)
RBC: 3.51 MIL/uL — ABNORMAL LOW (ref 4.22–5.81)
RDW: 26.6 % — ABNORMAL HIGH (ref 11.5–15.5)
WBC: 12.3 10*3/uL — ABNORMAL HIGH (ref 4.0–10.5)
nRBC: 0 % (ref 0.0–0.2)

## 2021-07-13 LAB — RESP PANEL BY RT-PCR (FLU A&B, COVID) ARPGX2
Influenza A by PCR: NEGATIVE
Influenza B by PCR: NEGATIVE
SARS Coronavirus 2 by RT PCR: NEGATIVE

## 2021-07-13 MED ORDER — BISACODYL 10 MG RE SUPP
10.0000 mg | Freq: Every day | RECTAL | Status: DC | PRN
  Administered 2021-07-13: 10 mg via RECTAL
  Filled 2021-07-13: qty 1

## 2021-07-13 MED ORDER — SODIUM CHLORIDE 0.9% FLUSH: 10.0000 mL | INTRAVENOUS | Status: DC | PRN

## 2021-07-13 MED ORDER — SODIUM CHLORIDE 0.9% FLUSH
10.0000 mL | Freq: Two times a day (BID) | INTRAVENOUS | Status: DC
  Administered 2021-07-13 – 2021-07-14 (×2): 10 mL

## 2021-07-13 MED ORDER — INSULIN ASPART 100 UNIT/ML IJ SOLN: 0.0000 [IU] | Freq: Three times a day (TID) | INTRAMUSCULAR | Status: DC

## 2021-07-13 MED ORDER — FUROSEMIDE 20 MG PO TABS: 20.0000 mg | ORAL_TABLET | ORAL | Status: DC

## 2021-07-13 MED ORDER — SENNOSIDES-DOCUSATE SODIUM 8.6-50 MG PO TABS
1.0000 | ORAL_TABLET | Freq: Two times a day (BID) | ORAL | Status: DC
  Administered 2021-07-13 – 2021-07-14 (×3): 1 via ORAL
  Filled 2021-07-13 (×3): qty 1

## 2021-07-13 MED ORDER — METHOCARBAMOL 500 MG PO TABS: 500.0000 mg | ORAL_TABLET | Freq: Three times a day (TID) | ORAL | Status: DC | PRN

## 2021-07-13 NOTE — Progress Notes (Signed)
Physical Therapy Treatment Patient Details Name: Ryan [name] MRN: [id number] DOB: [birth year] Today's Date: 07/13/2021   History of Present Illness Pt is a 75 year old male admitted 10/12 with AMS, ischemic LLE with gangrene L foot. 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 PMH of chronic systolic CHF, EF 62%, hypertension, diabetes mellitus type 2, CKD stage III, PVD s/p R AKA (04/2021), h/o pulmonary embolism on Eliquis, h/o CVA.    PT Comments    Pt in bed on arrival, sleepy and confused. Difficulty staying awake to interact with therapist. He required mod assist rolling R/L in supine for bed linen change (due to condom cath coming off). Muscle spasm noted L hip flexors with L hip at 90 degrees. Attempted to get L residual limb to relax into extension with low load prolonged stretch but unable to move beyond 90 degrees. Pt grimacing in pain, reporting 6/10. Assisted with positioning in R sidelying. RN notified of need for pain meds/ms relaxer.     Recommendations for follow up therapy are one component of a multi-disciplinary discharge planning process, led by the attending physician.  Recommendations may be updated based on patient status, additional functional criteria and insurance authorization.  Follow Up Recommendations  Skilled nursing-short term rehab (<3 hours/day)     Assistance Recommended at Discharge    Equipment Recommendations  Wheelchair (measurements PT);Wheelchair cushion (measurements PT);Other (comment)    Recommendations for Other Services       Precautions / Restrictions Precautions Precautions: Fall;Other (comment) Precaution Comments: nephrostomy bag Restrictions RLE Weight Bearing: Non weight bearing LLE Weight Bearing: Non weight bearing Other Position/Activity Restrictions: stump sock L residual limb     Mobility  Bed Mobility Overal bed mobility: Needs Assistance   Rolling: Mod assist         General bed mobility comments: +rail, multi modal cues for  sequencing    Transfers                        Ambulation/Gait                 Stairs             Wheelchair Mobility    Modified Rankin (Stroke Patients Only)       Balance                                            Cognition Arousal/Alertness: Lethargic Behavior During Therapy: WFL for tasks assessed/performed;Flat affect Overall Cognitive Status: Impaired/Different from baseline Area of Impairment: Attention;Memory;Problem solving;Awareness                   Current Attention Level: Sustained Memory: Decreased short-term memory     Awareness: Emergent Problem Solving: Slow processing;Requires verbal cues;Requires tactile cues;Difficulty sequencing General Comments: Pt appears more lethargic and confused this morning. Pt reports he doesn't think he slept well last night. Pt squatting at the bed (as if he sees bugs). When asked "what do you see?", pt states "nothing."        Exercises Other Exercises Other Exercises: low load prolonged stretch L hip flexors    General Comments        Pertinent Vitals/Pain Pain Assessment: 0-10 Pain Score: 6  Pain Location: L residual limb Pain Descriptors / Indicators: Grimacing;Guarding;Spasm Pain Intervention(s): Repositioned;Limited activity within patient's tolerance;Patient requesting pain meds-RN notified;Monitored during session  Home Living                          Prior Function            PT Goals (current goals can now be found in the care plan section) Acute Rehab PT Goals Patient Stated Goal: move better Progress towards PT goals: Not progressing toward goals - comment (lethargy and muscle spasm L hip flexors limiting mobility progression today)    Frequency    Min 3X/week      PT Plan Current plan remains appropriate    Co-evaluation              AM-PAC PT "6 Clicks" Mobility   Outcome Measure  Help needed turning from your  back to your side while in a flat bed without using bedrails?: A Lot Help needed moving from lying on your back to sitting on the side of a flat bed without using bedrails?: A Lot Help needed moving to and from a bed to a chair (including a wheelchair)?: Total Help needed standing up from a chair using your arms (e.g., wheelchair or bedside chair)?: Total Help needed to walk in hospital room?: Total Help needed climbing 3-5 steps with a railing? : Total 6 Click Score: 8    End of Session   Activity Tolerance: Patient limited by pain;Patient limited by lethargy (muscle spasm L hip flexors) Patient left: in bed;with bed alarm set;with call bell/phone within reach Nurse Communication: Mobility status;Patient requests pain meds;Other (comment) (increased confusion) PT Visit Diagnosis: Other abnormalities of gait and mobility (R26.89);Pain Pain - Right/Left: Left Pain - part of body: Leg     Time: 1031-5945 PT Time Calculation (min) (ACUTE ONLY): 14 min  Charges:  $Therapeutic Activity: 8-22 mins                     Lorrin Goodell, PT  Office # [phone] Pager [phone]    Lorriane Shire 07/13/2021, 10:29 AM

## 2021-07-13 NOTE — Progress Notes (Signed)
Progress Note    Ryan [name]  UKG:[id number] DOB: [birth year]  DOA: 07/01/2021 PCP: [name], MD    Brief Narrative:     Medical records reviewed and are as summarized below:  Ryan [name] is an 75 y.o. male with hx of chronic systolic CHF EF 76%, HTN, DM2, CKD stage 3, PVD s/p right AKA, ischemic left leg needing amputation but family refusing, hx of PE on Eliquis, hx of stroke, hx of retroperitoneal hematoma requiring left-sided nephrostomy tube due to urinary obstruction, who Presents to ER from SNF due to reported altered mental status.  Found to be bacteremic.  He is s/p left AKA.      Assessment/Plan:   Principal Problem:   Altered mental status Active Problems:   Sepsis with acute renal failure without septic shock (HCC)   Chronic systolic heart failure (HCC) - LVEF 25 to 30%   Type 2 diabetes mellitus (HCC)   Essential hypertension   AKI (acute kidney injury) (HCC)   Stage 3 chronic kidney disease (HCC) - baseline SCr 1.1   PAD (peripheral artery disease) ([place])   Pulmonary emboli 04/2021   Nephrostomy status (HCC)   Sepsis/Streptococcus bacteremia -Patient presented with sepsis with acute kidney injury -Likely source of infection is left foot cellulitis, infected wounds -Blood culture growing Streptococcus dysagalactiae started on ceftriaxone- change to PCN -s/p left AKA -ID consult appreciated: Opat done, continuous infusion PEN at SNF Can get repeat BCx done by primary 1 week post completing anbx.   -repeat BC NGTD -echo: Left ventricular ejection fraction, by estimation, is 25 to 30%. The  left ventricle has severely decreased function. The left ventricle  demonstrates global hypokinesis. The left ventricular internal cavity size  was mildly dilated. There is moderate  left ventricular hypertrophy.  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  -Amiodarone 200 mg p.o. daily  - restart Eliquis    Hypokalemia -replete  Constipation -bowel regimen   Family Communication/Anticipated D/C date and plan/Code Status   DVT prophylaxis: eliquis Code Status: Full Code.  Disposition Plan: Status is: Inpatient  Back to SNF once arrangements are made for IV abx and he will need IV access like midline or PICC         Medical Consultants:   Vascular ID  Subjective:   No BM yesterday  Objective:    Vitals:  07/12/21 2003 07/13/21 0431 07/13/21 0449 07/13/21 0750  BP: (!) 100/49 102/65  (!) 105/53  Pulse: 73 72  72  Resp: 20 16    Temp: 99.1 F (37.3 C) 97.6 F (36.4 C)  98 F (36.7 C)  TempSrc: Oral Oral    SpO2: 96% 100%  99%  Weight:   57.5 kg   Height:        Intake/Output Summary (Last 24 hours)  at 07/13/2021 1224 Last data filed at 07/13/2021 0500 Gross per 24 hour  Intake 1209.3 ml  Output 820 ml  Net 389.3 ml   Filed Weights   07/11/21 0439 07/12/21 0440 07/13/21 0449  Weight: 58.6 kg 58.5 kg 57.5 kg    Exam:   General: Appearance:    Thin male in no acute distress     Lungs:     respirations unlabored  Heart:    Normal heart rate.   MS:   Left above knee amputation noted. Right above knee amputation noted.           Data Reviewed:   I have personally reviewed following labs and imaging studies:  Labs: Labs show the following:   Basic Metabolic Panel: Recent Labs  Lab 07/08/21 0253 07/09/21 0353 07/10/21 0254 07/11/21 0147 07/12/21 0302  NA 144 144 142 139 135  K 4.1 3.1* 3.6 4.0 4.0  CL 113* 114* 115* 111 106  CO2 21* 23 22 22 22   GLUCOSE 102* 100* 120* 91 105*  BUN 13 13 13 13 12   CREATININE 1.14 1.16 1.04 1.04 0.93  CALCIUM 7.8* 7.9* 7.9* 7.9* 7.9*   GFR Estimated Creatinine Clearance: 54.1 mL/min (by C-G formula based on SCr of 0.93 mg/dL). Liver Function Tests: No results for input(s): AST, ALT, ALKPHOS, BILITOT, PROT, ALBUMIN in the last 168 hours.  No results for input(s): LIPASE, AMYLASE in the last 168 hours. No results for input(s): AMMONIA in the last 168 hours. Coagulation profile No results for input(s): INR, PROTIME in the last 168 hours.   CBC: Recent Labs  Lab 07/09/21 0353 07/10/21 0254 07/11/21 0147 07/12/21 0302 07/13/21 0205  WBC 12.4* 12.0* 11.8* 11.8* 12.3*  HGB 6.7* 7.2* 7.1* 7.5* 7.8*  HCT 21.7* 23.2* 22.4* 22.8* 25.1*  MCV 70.0* 70.9* 70.4* 70.2* 71.5*  PLT 203 220 246 314 335   Cardiac Enzymes: No results for input(s): CKTOTAL, CKMB, CKMBINDEX, TROPONINI in the last 168 hours. BNP (last 3 results) Recent Labs    08/25/20 1121 03/27/21 1123  PROBNP 21,395* 3,146*   CBG: Recent Labs  Lab 07/12/21 1650 07/13/21 0028 07/13/21 0630 07/13/21 0747 07/13/21 1212  GLUCAP 85 107* 108* 104* 116*    D-Dimer: No results for input(s): DDIMER in the last 72 hours. Hgb A1c: No results for input(s): HGBA1C in the last 72 hours. Lipid Profile: No results for input(s): CHOL, HDL, LDLCALC, TRIG, CHOLHDL, LDLDIRECT in the last 72 hours. Thyroid function studies: No results for input(s): TSH, T4TOTAL, T3FREE, THYROIDAB in the last 72 hours.  Invalid input(s): FREET3 Anemia work up: Recent Labs    07/12/21 0302  FERRITIN 628*  TIBC 127*  IRON 24*   Sepsis Labs: Recent Labs  Lab 07/10/21 0254 07/11/21 0147 07/12/21 0302 07/13/21 0205  WBC 12.0* 11.8* 11.8* 12.3*    Microbiology Recent Results (from the past 240 hour(s))  Culture, blood (Routine X 2) w Reflex to ID Panel     Status: None (Preliminary result)   Collection Time: 07/09/21  3:58 PM  Specimen: BLOOD LEFT FOREARM  Result Value Ref Range Status   Specimen Description BLOOD LEFT FOREARM  Final   Special Requests   Final    BOTTLES DRAWN AEROBIC AND ANAEROBIC Blood Culture adequate volume   Culture   Final    NO GROWTH 4 DAYS Performed at [facility], [address] [place], [place] [address]    Report Status PENDING  Incomplete    Procedures and diagnostic studies:  No results found.  Medications:    amiodarone  200 mg Oral Daily   apixaban  5 mg Oral Q12H   [START ON 07/15/2021] furosemide  20 mg Oral QODAY   insulin aspart  0-6 Units Subcutaneous TID WC   midodrine  10 mg Oral TID WC   senna-docusate  1 tablet Oral BID   Continuous Infusions:  penicillin g continuous IV infusion 8 Million Units (07/13/21 0307)     LOS: 12 days   Geradine Girt  [facility]   How to contact the [facility] Attending or Consulting provider [place] or covering provider during after hours [place], for this patient?  Check the care team in [facility] and look for a) attending/consulting [facility] provider listed and b) the [facility] team listed Log into [email or web address] and use [place]'s universal password to access. If you  do not have the password, please contact the hospital operator. Locate the [facility] provider you are looking for under [facility] and page to a number that you can be directly reached. If you still have difficulty reaching the provider, please page the [facility] (Director on Call) for the Hospitalists listed on amion for assistance.  07/13/2021, 12:24 PM

## 2021-07-13 NOTE — Progress Notes (Signed)
Nurse notified to change tubing and removed IV distal to midline on the L arm. VU. Fran Lowes, RN VAST

## 2021-07-14 DIAGNOSIS — R300 Dysuria: Secondary | ICD-10-CM | POA: Diagnosis not present

## 2021-07-14 DIAGNOSIS — D509 Iron deficiency anemia, unspecified: Secondary | ICD-10-CM | POA: Diagnosis not present

## 2021-07-14 DIAGNOSIS — Z23 Encounter for immunization: Secondary | ICD-10-CM | POA: Diagnosis not present

## 2021-07-14 DIAGNOSIS — E860 Dehydration: Secondary | ICD-10-CM | POA: Diagnosis present

## 2021-07-14 DIAGNOSIS — Z515 Encounter for palliative care: Secondary | ICD-10-CM | POA: Diagnosis not present

## 2021-07-14 DIAGNOSIS — E785 Hyperlipidemia, unspecified: Secondary | ICD-10-CM | POA: Diagnosis not present

## 2021-07-14 DIAGNOSIS — I480 Paroxysmal atrial fibrillation: Secondary | ICD-10-CM | POA: Diagnosis present

## 2021-07-14 DIAGNOSIS — I471 Supraventricular tachycardia: Secondary | ICD-10-CM | POA: Diagnosis present

## 2021-07-14 DIAGNOSIS — L89322 Pressure ulcer of left buttock, stage 2: Secondary | ICD-10-CM | POA: Diagnosis present

## 2021-07-14 DIAGNOSIS — R252 Cramp and spasm: Secondary | ICD-10-CM | POA: Diagnosis not present

## 2021-07-14 DIAGNOSIS — N183 Chronic kidney disease, stage 3 unspecified: Secondary | ICD-10-CM | POA: Diagnosis present

## 2021-07-14 DIAGNOSIS — M79605 Pain in left leg: Secondary | ICD-10-CM | POA: Diagnosis not present

## 2021-07-14 DIAGNOSIS — Z89612 Acquired absence of left leg above knee: Secondary | ICD-10-CM | POA: Diagnosis not present

## 2021-07-14 DIAGNOSIS — Z86718 Personal history of other venous thrombosis and embolism: Secondary | ICD-10-CM | POA: Diagnosis not present

## 2021-07-14 DIAGNOSIS — R5381 Other malaise: Secondary | ICD-10-CM | POA: Diagnosis not present

## 2021-07-14 DIAGNOSIS — J84113 Idiopathic non-specific interstitial pneumonitis: Secondary | ICD-10-CM | POA: Diagnosis not present

## 2021-07-14 DIAGNOSIS — Z20822 Contact with and (suspected) exposure to covid-19: Secondary | ICD-10-CM | POA: Diagnosis not present

## 2021-07-14 DIAGNOSIS — L89153 Pressure ulcer of sacral region, stage 3: Secondary | ICD-10-CM | POA: Diagnosis not present

## 2021-07-14 DIAGNOSIS — T83092A Other mechanical complication of nephrostomy catheter, initial encounter: Secondary | ICD-10-CM | POA: Diagnosis not present

## 2021-07-14 DIAGNOSIS — R2689 Other abnormalities of gait and mobility: Secondary | ICD-10-CM | POA: Diagnosis not present

## 2021-07-14 DIAGNOSIS — I5023 Acute on chronic systolic (congestive) heart failure: Secondary | ICD-10-CM | POA: Diagnosis not present

## 2021-07-14 DIAGNOSIS — R7989 Other specified abnormal findings of blood chemistry: Secondary | ICD-10-CM | POA: Diagnosis not present

## 2021-07-14 DIAGNOSIS — R0989 Other specified symptoms and signs involving the circulatory and respiratory systems: Secondary | ICD-10-CM | POA: Diagnosis not present

## 2021-07-14 DIAGNOSIS — T83098A Other mechanical complication of other indwelling urethral catheter, initial encounter: Secondary | ICD-10-CM | POA: Diagnosis not present

## 2021-07-14 DIAGNOSIS — L89150 Pressure ulcer of sacral region, unstageable: Secondary | ICD-10-CM | POA: Diagnosis not present

## 2021-07-14 DIAGNOSIS — I5022 Chronic systolic (congestive) heart failure: Secondary | ICD-10-CM | POA: Diagnosis not present

## 2021-07-14 DIAGNOSIS — R401 Stupor: Secondary | ICD-10-CM | POA: Diagnosis not present

## 2021-07-14 DIAGNOSIS — Z4889 Encounter for other specified surgical aftercare: Secondary | ICD-10-CM | POA: Diagnosis not present

## 2021-07-14 DIAGNOSIS — I739 Peripheral vascular disease, unspecified: Secondary | ICD-10-CM | POA: Diagnosis not present

## 2021-07-14 DIAGNOSIS — I5043 Acute on chronic combined systolic (congestive) and diastolic (congestive) heart failure: Secondary | ICD-10-CM | POA: Diagnosis not present

## 2021-07-14 DIAGNOSIS — Z936 Other artificial openings of urinary tract status: Secondary | ICD-10-CM | POA: Diagnosis not present

## 2021-07-14 DIAGNOSIS — E1151 Type 2 diabetes mellitus with diabetic peripheral angiopathy without gangrene: Secondary | ICD-10-CM | POA: Diagnosis present

## 2021-07-14 DIAGNOSIS — Z4781 Encounter for orthopedic aftercare following surgical amputation: Secondary | ICD-10-CM | POA: Diagnosis not present

## 2021-07-14 DIAGNOSIS — Z66 Do not resuscitate: Secondary | ICD-10-CM | POA: Diagnosis not present

## 2021-07-14 DIAGNOSIS — J918 Pleural effusion in other conditions classified elsewhere: Secondary | ICD-10-CM | POA: Diagnosis present

## 2021-07-14 DIAGNOSIS — Y732 Prosthetic and other implants, materials and accessory gastroenterology and urology devices associated with adverse incidents: Secondary | ICD-10-CM | POA: Diagnosis present

## 2021-07-14 DIAGNOSIS — I420 Dilated cardiomyopathy: Secondary | ICD-10-CM | POA: Diagnosis not present

## 2021-07-14 DIAGNOSIS — I130 Hypertensive heart and chronic kidney disease with heart failure and stage 1 through stage 4 chronic kidney disease, or unspecified chronic kidney disease: Secondary | ICD-10-CM | POA: Diagnosis present

## 2021-07-14 DIAGNOSIS — E1122 Type 2 diabetes mellitus with diabetic chronic kidney disease: Secondary | ICD-10-CM | POA: Diagnosis present

## 2021-07-14 DIAGNOSIS — J9601 Acute respiratory failure with hypoxia: Secondary | ICD-10-CM | POA: Diagnosis not present

## 2021-07-14 DIAGNOSIS — T83012A Breakdown (mechanical) of nephrostomy catheter, initial encounter: Secondary | ICD-10-CM | POA: Diagnosis present

## 2021-07-14 DIAGNOSIS — J90 Pleural effusion, not elsewhere classified: Secondary | ICD-10-CM | POA: Diagnosis not present

## 2021-07-14 DIAGNOSIS — N390 Urinary tract infection, site not specified: Secondary | ICD-10-CM | POA: Diagnosis not present

## 2021-07-14 DIAGNOSIS — E1159 Type 2 diabetes mellitus with other circulatory complications: Secondary | ICD-10-CM | POA: Diagnosis not present

## 2021-07-14 DIAGNOSIS — R1312 Dysphagia, oropharyngeal phase: Secondary | ICD-10-CM | POA: Diagnosis not present

## 2021-07-14 DIAGNOSIS — I9589 Other hypotension: Secondary | ICD-10-CM | POA: Diagnosis present

## 2021-07-14 DIAGNOSIS — Z7189 Other specified counseling: Secondary | ICD-10-CM | POA: Diagnosis not present

## 2021-07-14 DIAGNOSIS — M6281 Muscle weakness (generalized): Secondary | ICD-10-CM | POA: Diagnosis not present

## 2021-07-14 DIAGNOSIS — E1169 Type 2 diabetes mellitus with other specified complication: Secondary | ICD-10-CM | POA: Diagnosis not present

## 2021-07-14 DIAGNOSIS — E46 Unspecified protein-calorie malnutrition: Secondary | ICD-10-CM | POA: Diagnosis not present

## 2021-07-14 DIAGNOSIS — R0789 Other chest pain: Secondary | ICD-10-CM | POA: Diagnosis not present

## 2021-07-14 DIAGNOSIS — R41841 Cognitive communication deficit: Secondary | ICD-10-CM | POA: Diagnosis not present

## 2021-07-14 DIAGNOSIS — D631 Anemia in chronic kidney disease: Secondary | ICD-10-CM | POA: Diagnosis present

## 2021-07-14 DIAGNOSIS — Z743 Need for continuous supervision: Secondary | ICD-10-CM | POA: Diagnosis not present

## 2021-07-14 DIAGNOSIS — R062 Wheezing: Secondary | ICD-10-CM | POA: Diagnosis not present

## 2021-07-14 DIAGNOSIS — I959 Hypotension, unspecified: Secondary | ICD-10-CM | POA: Diagnosis not present

## 2021-07-14 DIAGNOSIS — I5082 Biventricular heart failure: Secondary | ICD-10-CM | POA: Diagnosis present

## 2021-07-14 DIAGNOSIS — N179 Acute kidney failure, unspecified: Secondary | ICD-10-CM | POA: Diagnosis not present

## 2021-07-14 DIAGNOSIS — I96 Gangrene, not elsewhere classified: Secondary | ICD-10-CM | POA: Diagnosis not present

## 2021-07-14 DIAGNOSIS — Z89611 Acquired absence of right leg above knee: Secondary | ICD-10-CM | POA: Diagnosis not present

## 2021-07-14 DIAGNOSIS — L89312 Pressure ulcer of right buttock, stage 2: Secondary | ICD-10-CM | POA: Diagnosis not present

## 2021-07-14 DIAGNOSIS — R531 Weakness: Secondary | ICD-10-CM | POA: Diagnosis not present

## 2021-07-14 DIAGNOSIS — E872 Acidosis, unspecified: Secondary | ICD-10-CM | POA: Diagnosis not present

## 2021-07-14 DIAGNOSIS — I5084 End stage heart failure: Secondary | ICD-10-CM | POA: Diagnosis present

## 2021-07-14 DIAGNOSIS — R051 Acute cough: Secondary | ICD-10-CM | POA: Diagnosis not present

## 2021-07-14 LAB — GLUCOSE, CAPILLARY
Glucose-Capillary: 78 mg/dL (ref 70–99)
Glucose-Capillary: 87 mg/dL (ref 70–99)
Glucose-Capillary: 108 mg/dL — ABNORMAL HIGH (ref 70–99)
Glucose-Capillary: 91 mg/dL (ref 70–99)
Glucose-Capillary: 90 mg/dL (ref 70–99)
Glucose-Capillary: 93 mg/dL (ref 70–99)

## 2021-07-14 LAB — CULTURE, BLOOD (ROUTINE X 2)
Culture: NO GROWTH
Special Requests: ADEQUATE

## 2021-07-14 MED ORDER — INSULIN ASPART 100 UNIT/ML IJ SOLN: 0.0000 [IU] | Freq: Three times a day (TID) | INTRAMUSCULAR | 11 refills | Status: AC

## 2021-07-14 MED ORDER — PENICILLIN G POTASSIUM 20000000 UNITS IJ SOLR: 8.0000 10*6.[IU] | Freq: Two times a day (BID) | INTRAVENOUS | Status: DC

## 2021-07-14 MED ORDER — FUROSEMIDE 20 MG PO TABS: 20.0000 mg | ORAL_TABLET | ORAL | Status: AC

## 2021-07-14 MED ORDER — PENICILLIN G POTASSIUM 20000000 UNITS IJ SOLR: 16.0000 10*6.[IU] | INTRAVENOUS | Status: AC

## 2021-07-14 MED ORDER — OXYCODONE HCL 5 MG PO TABS: 5.0000 mg | ORAL_TABLET | Freq: Four times a day (QID) | ORAL | 0 refills | Status: AC | PRN

## 2021-07-14 NOTE — Plan of Care (Signed)
  Problem: Education: Goal: Knowledge of General Education information will improve Description: Including pain rating scale, medication(s)/side effects and non-pharmacologic comfort measures Outcome: Adequate for Discharge   Problem: Health Behavior/Discharge Planning: Goal: Ability to manage health-related needs will improve Outcome: Adequate for Discharge   Problem: Clinical Measurements: Goal: Ability to maintain clinical measurements within normal limits will improve Outcome: Adequate for Discharge Goal: Will remain free from infection Outcome: Adequate for Discharge Goal: Diagnostic test results will improve Outcome: Adequate for Discharge   Problem: Activity: Goal: Risk for activity intolerance will decrease Outcome: Adequate for Discharge   Problem: Pain Managment: Goal: General experience of comfort will improve Outcome: Adequate for Discharge   Problem: Safety: Goal: Ability to remain free from injury will improve Outcome: Adequate for Discharge   Problem: Skin Integrity: Goal: Risk for impaired skin integrity will decrease Outcome: Adequate for Discharge   Problem: Clinical Measurements: Goal: Diagnostic test results will improve Outcome: Adequate for Discharge Goal: Signs and symptoms of infection will decrease Outcome: Adequate for Discharge   Problem: Respiratory: Goal: Ability to maintain adequate ventilation will improve Outcome: Adequate for Discharge   Problem: Respiratory: Goal: Ability to maintain adequate ventilation will improve Outcome: Adequate for Discharge   Problem: SLP Dysphagia Goals Goal: Patient will utilize recommended strategies Description: Patient will utilize recommended strategies during swallow to increase swallowing safety with Outcome: Adequate for Discharge   Problem: SLP Dysphagia Goals Goal: Misc Dysphagia Goal Outcome: Adequate for Discharge   Problem: Acute Rehab PT Goals(only PT should resolve) Goal: Pt Will Go  Supine/Side To Sit Outcome: Adequate for Discharge Goal: Pt Will Go Sit To Supine/Side Outcome: Adequate for Discharge Goal: Pt Will Transfer Bed To Chair/Chair To Bed Outcome: Adequate for Discharge   Problem: Acute Rehab PT Goals(only PT should resolve) Goal: Patient Will Perform Sitting Balance Outcome: Adequate for Discharge Goal: Pt/caregiver will Perform Home Exercise Program Outcome: Adequate for Discharge

## 2021-07-14 NOTE — Discharge Summary (Addendum)
Physician Discharge Summary  Ryan [name] DPO:[id number] DOB: 09-Mar-1946 DOA: 07/01/2021  PCP: [name], MD  Admit date: 07/01/2021 Discharge date: 07/14/2021  Admitted From: SNF Discharge disposition: sNF   Recommendations for Outpatient Follow-Up:   Weekly: cbc, BMP Needs follow up with Dr. Jeffie Pollock re: perc nephrostomy-- capping trial? 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  Diet recommendation: Low sodium, heart healthy.  Carbohydrate-modified.  Wound care: see below  Code status: Full.   History of Present Illness:   75yo AAM with hx of chronic systolic CHF EF 43%, HTN, DM2, CKD stage 3, PVD s/p right AKA, ischemic left leg needing amputation but family refusing, hx of PE on Eliquis, hx of stroke, hx of retroperitoneal hematoma requiring left-sided nephrostomy tube due to urinary obstruction, who Presents to ER from SNF due to reported altered mental status.   Due to altered mental status, patient cannot give any history or review of systems.  Patient noted to be febrile to 102.3 on admission.  Code sepsis was started.   Patient received 30 cc/kg of IV fluids.   Lab evaluation showed a white count of 19.7 hemoglobin 10.6 platelets of 236.  COVID swab was negative.  Lactic acid was elevated 2.0.   Chemistry showed a sodium 140 BUN of 35 creatinine 1.79.  Baseline creatinine approximately 1.1-1.2.   Due to sepsis, aki,  altered mental status, [facility] hospital[facility] contacted for admission.    Hospital Course by Problem:   Sepsis/Streptococcus bacteremia -Patient presented with sepsis with acute kidney injury -Likely source of infection is left foot cellulitis, infected wounds -Blood culture growing Streptococcus dysagalactiae started on ceftriaxone- change to PCN -s/p left AKA -ID consult appreciated: Opat done, continuous infusion PEN at SNF Can get repeat BCx done by primary 1 week post completing anbx.   -repeat BC NGTD -echo: Left ventricular ejection fraction, by estimation, is 25 to 30%. The  left ventricle has severely decreased function. The left ventricle  demonstrates global hypokinesis. The left ventricular internal cavity size  was mildly dilated. There is moderate  left ventricular hypertrophy.     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  -Amiodarone 200 mg p.o. daily  - restart Eliquis     Hypokalemia -replete   Constipation -bowel regimen      Medical Consultants:   ortho   Discharge Exam:   Vitals:   07/14/21 0600 07/14/21 1210  BP: (!) 106/55 113/63  Pulse:  77  Resp: 18 (!) 22  Temp: 98.8 F (37.1 C) 99.7 F (37.6 C)  SpO2: 92% 97%   Vitals:   07/13/21 1559 07/14/21 0500 07/14/21 0600 07/14/21 1210  BP: (!) 107/58  (!) 106/55 113/63  Pulse: 76   77  Resp: 19  18 (!) 22  Temp: 98 F (36.7 C)  98.8 F (37.1 C) 99.7 F (37.6 C)  TempSrc:   Oral Oral  SpO2: 98%  92% 97%  Weight:  56.7 kg    Height:        General exam: Appears calm and comfortable.   The results of significant diagnostics from this hospitalization (including imaging, microbiology, ancillary and laboratory) are listed below for reference.     Procedures and Diagnostic Studies:   DG Chest Port 1 View  Result Date: 07/01/2021 CLINICAL DATA:  Questionable sepsis. EXAM: PORTABLE CHEST 1 VIEW COMPARISON:  May 11, 2021 FINDINGS: Multiple sternal wires are seen. A dual lead AICD is noted. The nasogastric tube seen on the prior study has been removed. Mild atelectasis is seen within the bilateral lung bases. There is no evidence of a pleural effusion or pneumothorax. The heart size and mediastinal contours are within normal limits. There is marked severity calcification of the aortic arch. A radiopaque catheter/stent is seen overlying the left upper quadrant. The visualized skeletal structures are unremarkable. IMPRESSION: 1. Evidence of prior median sternotomy/CABG. 2. Mild bibasilar atelectasis. Electronically Signed   By: Virgina Norfolk M.D.   On: 07/01/2021 15:54   CT Renal Stone Study  Result Date: 07/02/2021 CLINICAL DATA:  Left flank pain EXAM: CT ABDOMEN AND PELVIS WITHOUT CONTRAST TECHNIQUE: Multidetector CT  imaging of the abdomen and pelvis was performed following the standard protocol without IV contrast. COMPARISON:  06/06/2021 FINDINGS: Lower chest: Scarring and cylindrical bronchiectasis at the bases. Cardiomegaly and minimally covered pacer lead. Hepatobiliary: No focal liver abnormality.Cholelithiasis. Full gallbladder without visible inflammation. Pancreas: Unremarkable. Spleen: Unremarkable. Adrenals/Urinary Tract: Negative adrenals. Left percutaneous nephrostomy tube in unremarkable position. No hydronephrosis, perinephric stranding, or post procedural hemorrhage. Pre-existing organized hematoma in the left retroperitoneum has clearly decreased in size. Pneumaturia with chronic bladder wall indistinctness and mild thickening. Stomach/Bowel: Desiccated high-density stool in the distal colon with rectal over distension measuring 8.5 cm. No appendicitis. Vascular/Lymphatic: No acute vascular abnormality. Atheromatous plaque which is multifocal on the aorta. No mass or adenopathy. Reproductive:No pathologic findings. Other: No ascites or pneumoperitoneum. Musculoskeletal: No acute abnormalities. Generalized lumbar spine degeneration with remote endplate fractures at L2, L3, and L5. IMPRESSION: 1. Interval left percutaneous nephrostomy tube with no complicating feature. Resolved hydronephrosis. 2. Pneumaturia which can be correlated with urinalysis. 3. Decreasing left retroperitoneal hematoma. 4. Stool distended rectum measuring up to 8.5 cm diameter. 5. Cholelithiasis without cholecystitis. Electronically Signed   By: Jorje Guild M.D.   On: 07/02/2021 04:44     Labs:  Basic Metabolic Panel: Recent Labs  Lab 07/08/21 0253 07/09/21 0353 07/10/21 0254 07/11/21 0147 07/12/21 0302  NA 144 144 142 139 135  K 4.1 3.1* 3.6 4.0 4.0  CL 113* 114* 115* 111 106  CO2 21* 23 22 22 22   GLUCOSE 102* 100* 120* 91 105*  BUN 13 13 13 13 12   CREATININE 1.14 1.16 1.04 1.04 0.93  CALCIUM 7.8* 7.9* 7.9* 7.9*  7.9*   GFR Estimated Creatinine Clearance: 53.3 mL/min (by C-G formula based on SCr of 0.93 mg/dL). Liver Function Tests: No results for input(s): AST, ALT, ALKPHOS, BILITOT, PROT, ALBUMIN in the last 168 hours. No results for input(s): LIPASE, AMYLASE in the last 168 hours. No results for input(s): AMMONIA in the last 168 hours. Coagulation profile No results for input(s): INR, PROTIME in the last 168 hours.  CBC: Recent Labs  Lab 07/09/21 0353 07/10/21 0254 07/11/21 0147 07/12/21 0302 07/13/21 0205  WBC 12.4* 12.0* 11.8* 11.8* 12.3*  HGB 6.7* 7.2* 7.1* 7.5* 7.8*  HCT 21.7* 23.2* 22.4* 22.8* 25.1*  MCV 70.0* 70.9* 70.4* 70.2* 71.5*  PLT 203 220 246 314 335   Cardiac Enzymes: No results for input(s): CKTOTAL, CKMB, CKMBINDEX, TROPONINI in the last 168 hours. BNP: Invalid input(s): POCBNP CBG: Recent Labs  Lab 07/13/21 1556 07/13/21 2132 07/14/21 0643 07/14/21 0825 07/14/21 1210  GLUCAP 86 78 87 108* 91   D-Dimer No results for input(s): DDIMER in the last 72 hours. Hgb A1c No results for input(s): HGBA1C in the last 72 hours. Lipid Profile No results for input(s): CHOL, HDL, LDLCALC, TRIG, CHOLHDL, LDLDIRECT in the last 72 hours. Thyroid function studies No results for input(s): TSH, T4TOTAL, T3FREE, THYROIDAB in the last 72 hours.  Invalid input(s): FREET3 Anemia work up Recent Labs    07/12/21 0302  FERRITIN 628*  TIBC 127*  IRON 24*   Microbiology Recent Results (from the past 240 hour(s))  Culture, blood (Routine X 2) w Reflex to ID Panel     Status: None   Collection Time: 07/09/21  3:58 PM   Specimen: BLOOD LEFT FOREARM  Result Value Ref Range Status   Specimen Description BLOOD LEFT FOREARM  Final   Special Requests   Final    BOTTLES DRAWN AEROBIC AND ANAEROBIC Blood Culture adequate volume   Culture   Final    NO GROWTH 5 DAYS Performed at [facility], [address], [place], [place] [address]    Report Status 07/14/2021 FINAL   Final  Resp Panel by RT-PCR (Flu A&B, Covid) Nasopharyngeal Swab     Status: None   Collection Time: 07/13/21  3:57 PM   Specimen: Nasopharyngeal Swab; Nasopharyngeal(NP) swabs in vial transport medium  Result Value Ref Range Status   SARS Coronavirus 2 by RT PCR NEGATIVE NEGATIVE Final    Comment: (NOTE) SARS-CoV-2 target nucleic acids are NOT DETECTED.  The SARS-CoV-2 RNA is generally detectable in upper respiratory specimens during the acute phase of infection. The lowest concentration of SARS-CoV-2 viral copies this assay can detect is 138 copies/mL. A negative result does not preclude SARS-Cov-2 infection and should not be used as the sole basis for treatment or other patient management decisions. A negative result may occur with  improper specimen collection/handling, submission of specimen other than nasopharyngeal swab, presence of viral mutation(s) within the areas targeted by this assay, and inadequate number of viral copies(<138 copies/mL). A negative result must be combined with clinical observations, patient history, and epidemiological information. The expected result  is Negative.  Fact Sheet for Patients:  EntrepreneurPulse.com.au  Fact Sheet for Healthcare Providers:  IncredibleEmployment.be  This test is no t yet approved or cleared by the [place] FDA and  has been authorized for detection and/or diagnosis of SARS-CoV-2 by FDA under an Emergency Use Authorization (EUA). This EUA will remain  in effect (meaning this test can be used) for the duration of the COVID-19 declaration under Section 564(b)(1) of the Act, 21 U.S.C.section 360bbb-3(b)(1), unless the authorization is terminated  or revoked sooner.       Influenza A by PCR NEGATIVE NEGATIVE Final   Influenza B by PCR NEGATIVE NEGATIVE Final    Comment: (NOTE) The Xpert Xpress SARS-CoV-2/FLU/RSV plus assay is intended as an aid in the diagnosis of influenza from  Nasopharyngeal swab specimens and should not be used as a sole basis for treatment. Nasal washings and aspirates are unacceptable for Xpert Xpress SARS-CoV-2/FLU/RSV testing.  Fact Sheet for Patients: EntrepreneurPulse.com.au  Fact Sheet for Healthcare Providers: IncredibleEmployment.be  This test is not yet approved or cleared by the [place] FDA and has been authorized for detection and/or diagnosis of SARS-CoV-2 by FDA under an Emergency Use Authorization (EUA). This EUA will remain in effect (meaning this test can be used) for the duration of the COVID-19 declaration under Section 564(b)(1) of the Act, 21 U.S.C. section 360bbb-3(b)(1), unless the authorization is terminated or revoked.  Performed at [facility], [place] [address], [place], [place] [address]      Discharge Instructions:   Discharge Instructions     Diet - low sodium heart healthy   Complete by: As directed    Diet Carb Modified   Complete by: As directed    Discharge wound care:   Complete by: As directed    Place a Xeroform gauze over the left buttock wound and the fissure in the intergluteal fold, secure with sacral foam dressing placed with the tip up. Change Xeroform daily   Increase activity slowly   Complete by: As directed       Allergies as of 07/14/2021       Reactions   Tuna [fish Allergy] Nausea And Vomiting        Medication List     STOP taking these medications    empagliflozin 10 MG Tabs tablet Commonly known as: JARDIANCE   gabapentin 300 MG capsule Commonly known as: NEURONTIN   guaiFENesin 100 MG/5ML Soln Commonly known as: ROBITUSSIN   HYDROcodone-acetaminophen 7.5-325 MG tablet Commonly known as: NORCO   pantoprazole 40 MG tablet Commonly known as: PROTONIX   triamcinolone cream 0.5 % Commonly known as: KENALOG       TAKE these medications    acetaminophen 325 MG tablet Commonly known as: TYLENOL Take 650  mg by mouth every 6 (six) hours as needed for mild pain or headache.   amiodarone 200 MG tablet Commonly known as: PACERONE TAKE 1 TABLET BY MOUTH EVERY DAY   apixaban 5 MG Tabs tablet Commonly known as: ELIQUIS Take 1 tablet (5 mg total) by mouth every 12 (twelve) hours.   atorvastatin 40 MG tablet Commonly known as: LIPITOR Take 1 tablet (40 mg total) by mouth daily at 6 PM. What changed: when to take this   bisacodyl 5 MG EC tablet Commonly known as: DULCOLAX Take 5 mg by mouth daily as needed (constipation).   docusate sodium 100 MG capsule Commonly known as: COLACE Take 100 mg by mouth daily.   Ensure Take 237 mLs  by mouth in the morning, at noon, and at bedtime.   feeding supplement (PRO-STAT SUGAR FREE 64) Liqd Take 30 mLs by mouth 3 (three) times daily. Take with 4 oz juice   furosemide 20 MG tablet Commonly known as: LASIX Take 1 tablet (20 mg total) by mouth every other day. Start taking on: July 15, 2021 What changed:  medication strength how much to take when to take this   insulin aspart 100 UNIT/ML injection Commonly known as: novoLOG Inject 0-6 Units into the skin 3 (three) times daily with meals.   midodrine 10 MG tablet Commonly known as: PROAMATINE Take 1 tablet (10 mg total) by mouth 3 (three) times daily with meals.   oxyCODONE 5 MG immediate release tablet Commonly known as: Oxy IR/ROXICODONE Take 1 tablet (5 mg total) by mouth every 6 (six) hours as needed for severe pain.   penicillin G potassium 16 Million Units in dextrose 5 % 500 mL Inject 16 Million Units into the vein daily for 3 days. Stop date 07/17/21 BMET and CBC once weekly  Fax labs to [phone]               Discharge Care Instructions  (From admission, onward)           Start     Ordered   07/14/21 0000  Discharge wound care:       Comments: Place a Xeroform gauze over the left buttock wound and the fissure in the intergluteal fold, secure with sacral  foam dressing placed with the tip up. Change Xeroform daily   07/14/21 1452            Follow-up [place] Follow up.   Why: IR scheduler will call you with appointment date/time for left nephrostomy exchange (typically 6-8 weeks from last exchange on 9/18). Contact information: [place] [address] [phone]         [name], MD Follow up in 1 week(s).   Specialty: Internal Medicine Contact information: [address] [place] [address] [phone]         Irine Seal, MD. Schedule an appointment as soon as possible for a visit.   Specialty: Urology Contact information: [place] [address] [phone]                  Time coordinating discharge: 35 min  Signed:  Geradine Girt DO  [facility] 07/14/2021, 2:52 PM

## 2021-07-14 NOTE — Progress Notes (Signed)
Attempted to report x 2 with no answer, d/c packet including AVS to be sent to with transport service.  Jahquan Klugh, Tivis Ringer, RN

## 2021-07-14 NOTE — TOC Progression Note (Signed)
Transition of Care [facility]) - Progression Note    Patient Details  Name: KIEFER OPHEIM MRN: [id number] Date of Birth: August 10, 1946  Transition of Care [facility]) CM/SW [place], RN Phone [phone]  07/14/2021, 3:23 PM  Clinical Narrative:    Patient to discharge to [facility]. Transportation has been set up with [facility] with a 7pm pickup time. Orlena Sheldon notified and made aware. Nurse has been update.  Please call report  [place] [facility] [phone]   Expected Discharge Plan: [place] Barriers to Discharge: Continued Medical Work up  Expected Discharge Plan and Services Expected Discharge Plan: [place] In-house Referral: NA Discharge Planning Services: CM Consult Post Acute Care Choice: NA Living arrangements for the past 2 months: Single Family Home Expected Discharge Date: 07/14/21               DME Arranged: N/A DME Agency: NA       HH Arranged: NA HH Agency: NA         Social Determinants of Health (SDOH) Interventions    Readmission Risk Interventions Readmission Risk Prevention Plan 07/09/2021 04/15/2021  Transportation Screening Complete -  PCP or Specialist Appt within 5-7 Days Complete -  Home Care Screening Complete Complete  Medication Review (RN CM) Complete Complete  Some recent data might be hidden

## 2021-07-15 DIAGNOSIS — L89150 Pressure ulcer of sacral region, unstageable: Secondary | ICD-10-CM | POA: Diagnosis not present

## 2021-07-15 DIAGNOSIS — I739 Peripheral vascular disease, unspecified: Secondary | ICD-10-CM | POA: Diagnosis not present

## 2021-07-15 DIAGNOSIS — Z4889 Encounter for other specified surgical aftercare: Secondary | ICD-10-CM | POA: Diagnosis not present

## 2021-07-15 DIAGNOSIS — E1159 Type 2 diabetes mellitus with other circulatory complications: Secondary | ICD-10-CM | POA: Diagnosis not present

## 2021-07-20 ENCOUNTER — Telehealth: Payer: Self-pay

## 2021-07-20 DIAGNOSIS — Z4889 Encounter for other specified surgical aftercare: Secondary | ICD-10-CM | POA: Diagnosis not present

## 2021-07-20 DIAGNOSIS — L89150 Pressure ulcer of sacral region, unstageable: Secondary | ICD-10-CM | POA: Diagnosis not present

## 2021-07-20 DIAGNOSIS — I739 Peripheral vascular disease, unspecified: Secondary | ICD-10-CM | POA: Diagnosis not present

## 2021-07-20 DIAGNOSIS — E1159 Type 2 diabetes mellitus with other circulatory complications: Secondary | ICD-10-CM | POA: Diagnosis not present

## 2021-07-20 NOTE — Telephone Encounter (Signed)
Kela, wound care nurse called to let us know pt's surgical site is healing well. No redness, drainage and she is wanting to leave it open to air. Pt is due for post op appt later this week. She is aware and verbalized understanding and will call us back if anything changes.

## 2021-07-21 ENCOUNTER — Ambulatory Visit: Payer: Medicare HMO | Admitting: Vascular Surgery

## 2021-07-24 ENCOUNTER — Encounter: Payer: Self-pay | Admitting: Physician Assistant

## 2021-07-24 ENCOUNTER — Other Ambulatory Visit: Payer: Self-pay

## 2021-07-24 ENCOUNTER — Ambulatory Visit (INDEPENDENT_AMBULATORY_CARE_PROVIDER_SITE_OTHER): Payer: Medicare HMO | Admitting: Physician Assistant

## 2021-07-24 VITALS — BP 112/65 | HR 62 | Temp 97.6°F | Resp 20

## 2021-07-24 DIAGNOSIS — I779 Disorder of arteries and arterioles, unspecified: Secondary | ICD-10-CM

## 2021-07-24 NOTE — Progress Notes (Signed)
  POST OPERATIVE OFFICE NOTE    CC:  F/u for surgery  HPI:  This is a 75 y.o. male who is s/p Left AKA on 07/07/21 by Dr. Virl Cagey.  He had a previous right AKA by Dr. Scot Dock on 05/12/21.    Pt returns today for follow up.  Pt states he is doing well and working with PT.  He is residing at a SNF currently.He denise fever and chills.    Allergies  Allergen Reactions   Geralyn Flash [Fish Allergy] Nausea And Vomiting    Current Outpatient Medications  Medication Sig Dispense Refill   acetaminophen (TYLENOL) 325 MG tablet Take 650 mg by mouth every 6 (six) hours as needed for mild pain or headache.     Amino Acids-Protein Hydrolys (FEEDING SUPPLEMENT, PRO-STAT SUGAR FREE 64,) LIQD Take 30 mLs by mouth 3 (three) times daily. Take with 4 oz juice     amiodarone (PACERONE) 200 MG tablet TAKE 1 TABLET BY MOUTH EVERY DAY (Patient taking differently: Take 200 mg by mouth daily.) 90 tablet 3   apixaban (ELIQUIS) 5 MG TABS tablet Take 1 tablet (5 mg total) by mouth every 12 (twelve) hours. 60 tablet    atorvastatin (LIPITOR) 40 MG tablet Take 1 tablet (40 mg total) by mouth daily at 6 PM. (Patient taking differently: Take 40 mg by mouth at bedtime.) 30 tablet 5   bisacodyl (DULCOLAX) 5 MG EC tablet Take 5 mg by mouth daily as needed (constipation).     docusate sodium (COLACE) 100 MG capsule Take 100 mg by mouth daily.     Ensure (ENSURE) Take 237 mLs by mouth in the morning, at noon, and at bedtime.     furosemide (LASIX) 20 MG tablet Take 1 tablet (20 mg total) by mouth every other day. 30 tablet    insulin aspart (NOVOLOG) 100 UNIT/ML injection Inject 0-6 Units into the skin 3 (three) times daily with meals. 10 mL 11   midodrine (PROAMATINE) 10 MG tablet Take 1 tablet (10 mg total) by mouth 3 (three) times daily with meals.     oxyCODONE (OXY IR/ROXICODONE) 5 MG immediate release tablet Take 1 tablet (5 mg total) by mouth every 6 (six) hours as needed for severe pain. 4 tablet 0   No current  facility-administered medications for this visit.     ROS:  See HPI  Physical Exam:    Incision:  The left AKA stump incision is healing well, staple intact. Extremities:  right AKA well healed warm to touch,  left stump warm and appears viable Lungs non labored breathing Heart RRR  Assessment/Plan:  This is a 75 y.o. male who is s/p Left AKA on 07/07/21 by Dr. Virl Cagey.  He had a previous right AKA by Dr. Scot Dock on 05/12/21.    We maintained the staples and he will f/u in 2 weeks for staple removal.     Roxy Horseman PA-C [facility] [phone]   Clinic MD:  Virl Cagey

## 2021-07-27 DIAGNOSIS — I739 Peripheral vascular disease, unspecified: Secondary | ICD-10-CM | POA: Diagnosis not present

## 2021-07-27 DIAGNOSIS — Z4889 Encounter for other specified surgical aftercare: Secondary | ICD-10-CM | POA: Diagnosis not present

## 2021-07-27 DIAGNOSIS — E1159 Type 2 diabetes mellitus with other circulatory complications: Secondary | ICD-10-CM | POA: Diagnosis not present

## 2021-07-27 DIAGNOSIS — L89153 Pressure ulcer of sacral region, stage 3: Secondary | ICD-10-CM | POA: Diagnosis not present

## 2021-07-28 DIAGNOSIS — M79605 Pain in left leg: Secondary | ICD-10-CM | POA: Diagnosis not present

## 2021-07-28 DIAGNOSIS — R252 Cramp and spasm: Secondary | ICD-10-CM | POA: Diagnosis not present

## 2021-07-28 DIAGNOSIS — R2689 Other abnormalities of gait and mobility: Secondary | ICD-10-CM | POA: Diagnosis not present

## 2021-07-28 DIAGNOSIS — R5381 Other malaise: Secondary | ICD-10-CM | POA: Diagnosis not present

## 2021-07-31 DIAGNOSIS — N390 Urinary tract infection, site not specified: Secondary | ICD-10-CM | POA: Diagnosis not present

## 2021-07-31 DIAGNOSIS — R300 Dysuria: Secondary | ICD-10-CM | POA: Diagnosis not present

## 2021-07-31 DIAGNOSIS — Z89611 Acquired absence of right leg above knee: Secondary | ICD-10-CM | POA: Diagnosis not present

## 2021-07-31 DIAGNOSIS — I739 Peripheral vascular disease, unspecified: Secondary | ICD-10-CM | POA: Diagnosis not present

## 2021-07-31 DIAGNOSIS — Z89612 Acquired absence of left leg above knee: Secondary | ICD-10-CM | POA: Diagnosis not present

## 2021-08-03 DIAGNOSIS — R0989 Other specified symptoms and signs involving the circulatory and respiratory systems: Secondary | ICD-10-CM | POA: Diagnosis not present

## 2021-08-03 DIAGNOSIS — R0789 Other chest pain: Secondary | ICD-10-CM | POA: Diagnosis not present

## 2021-08-03 DIAGNOSIS — R062 Wheezing: Secondary | ICD-10-CM | POA: Diagnosis not present

## 2021-08-03 DIAGNOSIS — R051 Acute cough: Secondary | ICD-10-CM | POA: Diagnosis not present

## 2021-08-04 DIAGNOSIS — Z936 Other artificial openings of urinary tract status: Secondary | ICD-10-CM | POA: Diagnosis not present

## 2021-08-04 DIAGNOSIS — J84113 Idiopathic non-specific interstitial pneumonitis: Secondary | ICD-10-CM | POA: Diagnosis not present

## 2021-08-04 DIAGNOSIS — R062 Wheezing: Secondary | ICD-10-CM | POA: Diagnosis not present

## 2021-08-06 ENCOUNTER — Inpatient Hospital Stay (HOSPITAL_COMMUNITY)
Admission: EM | Admit: 2021-08-06 | Discharge: 2021-09-20 | DRG: 682 | Disposition: E | Payer: Medicare HMO | Source: Skilled Nursing Facility | Attending: Internal Medicine | Admitting: Internal Medicine

## 2021-08-06 DIAGNOSIS — L8990 Pressure ulcer of unspecified site, unspecified stage: Secondary | ICD-10-CM

## 2021-08-06 DIAGNOSIS — E860 Dehydration: Secondary | ICD-10-CM | POA: Diagnosis present

## 2021-08-06 DIAGNOSIS — Z794 Long term (current) use of insulin: Secondary | ICD-10-CM

## 2021-08-06 DIAGNOSIS — E785 Hyperlipidemia, unspecified: Secondary | ICD-10-CM

## 2021-08-06 DIAGNOSIS — I471 Supraventricular tachycardia: Secondary | ICD-10-CM | POA: Diagnosis present

## 2021-08-06 DIAGNOSIS — Z86711 Personal history of pulmonary embolism: Secondary | ICD-10-CM | POA: Diagnosis not present

## 2021-08-06 DIAGNOSIS — L89312 Pressure ulcer of right buttock, stage 2: Secondary | ICD-10-CM | POA: Diagnosis present

## 2021-08-06 DIAGNOSIS — I5084 End stage heart failure: Secondary | ICD-10-CM | POA: Diagnosis present

## 2021-08-06 DIAGNOSIS — Z7189 Other specified counseling: Secondary | ICD-10-CM | POA: Diagnosis not present

## 2021-08-06 DIAGNOSIS — Z79899 Other long term (current) drug therapy: Secondary | ICD-10-CM

## 2021-08-06 DIAGNOSIS — J918 Pleural effusion in other conditions classified elsewhere: Secondary | ICD-10-CM | POA: Diagnosis present

## 2021-08-06 DIAGNOSIS — Z86718 Personal history of other venous thrombosis and embolism: Secondary | ICD-10-CM

## 2021-08-06 DIAGNOSIS — R7989 Other specified abnormal findings of blood chemistry: Secondary | ICD-10-CM | POA: Diagnosis not present

## 2021-08-06 DIAGNOSIS — I420 Dilated cardiomyopathy: Secondary | ICD-10-CM | POA: Diagnosis present

## 2021-08-06 DIAGNOSIS — J9601 Acute respiratory failure with hypoxia: Secondary | ICD-10-CM | POA: Diagnosis present

## 2021-08-06 DIAGNOSIS — N179 Acute kidney failure, unspecified: Secondary | ICD-10-CM | POA: Diagnosis present

## 2021-08-06 DIAGNOSIS — T83098A Other mechanical complication of other indwelling urethral catheter, initial encounter: Secondary | ICD-10-CM

## 2021-08-06 DIAGNOSIS — Z66 Do not resuscitate: Secondary | ICD-10-CM | POA: Diagnosis not present

## 2021-08-06 DIAGNOSIS — I10 Essential (primary) hypertension: Secondary | ICD-10-CM | POA: Diagnosis present

## 2021-08-06 DIAGNOSIS — R34 Anuria and oliguria: Secondary | ICD-10-CM

## 2021-08-06 DIAGNOSIS — I517 Cardiomegaly: Secondary | ICD-10-CM | POA: Diagnosis not present

## 2021-08-06 DIAGNOSIS — J9811 Atelectasis: Secondary | ICD-10-CM | POA: Diagnosis not present

## 2021-08-06 DIAGNOSIS — D509 Iron deficiency anemia, unspecified: Secondary | ICD-10-CM | POA: Diagnosis not present

## 2021-08-06 DIAGNOSIS — N99528 Other complication of other external stoma of urinary tract: Secondary | ICD-10-CM | POA: Diagnosis not present

## 2021-08-06 DIAGNOSIS — K8020 Calculus of gallbladder without cholecystitis without obstruction: Secondary | ICD-10-CM | POA: Diagnosis not present

## 2021-08-06 DIAGNOSIS — I9589 Other hypotension: Secondary | ICD-10-CM | POA: Diagnosis present

## 2021-08-06 DIAGNOSIS — T83012A Breakdown (mechanical) of nephrostomy catheter, initial encounter: Secondary | ICD-10-CM | POA: Diagnosis present

## 2021-08-06 DIAGNOSIS — Z8619 Personal history of other infectious and parasitic diseases: Secondary | ICD-10-CM

## 2021-08-06 DIAGNOSIS — S36892A Contusion of other intra-abdominal organs, initial encounter: Secondary | ICD-10-CM | POA: Diagnosis not present

## 2021-08-06 DIAGNOSIS — Z89612 Acquired absence of left leg above knee: Secondary | ICD-10-CM

## 2021-08-06 DIAGNOSIS — Z91013 Allergy to seafood: Secondary | ICD-10-CM

## 2021-08-06 DIAGNOSIS — I5023 Acute on chronic systolic (congestive) heart failure: Secondary | ICD-10-CM | POA: Diagnosis not present

## 2021-08-06 DIAGNOSIS — Z20822 Contact with and (suspected) exposure to covid-19: Secondary | ICD-10-CM | POA: Diagnosis present

## 2021-08-06 DIAGNOSIS — R109 Unspecified abdominal pain: Secondary | ICD-10-CM | POA: Diagnosis not present

## 2021-08-06 DIAGNOSIS — D631 Anemia in chronic kidney disease: Secondary | ICD-10-CM | POA: Diagnosis present

## 2021-08-06 DIAGNOSIS — I480 Paroxysmal atrial fibrillation: Secondary | ICD-10-CM | POA: Diagnosis present

## 2021-08-06 DIAGNOSIS — N183 Chronic kidney disease, stage 3 unspecified: Secondary | ICD-10-CM | POA: Diagnosis present

## 2021-08-06 DIAGNOSIS — R0902 Hypoxemia: Secondary | ICD-10-CM | POA: Diagnosis not present

## 2021-08-06 DIAGNOSIS — J9690 Respiratory failure, unspecified, unspecified whether with hypoxia or hypercapnia: Secondary | ICD-10-CM | POA: Diagnosis not present

## 2021-08-06 DIAGNOSIS — Z95810 Presence of automatic (implantable) cardiac defibrillator: Secondary | ICD-10-CM

## 2021-08-06 DIAGNOSIS — E872 Acidosis, unspecified: Secondary | ICD-10-CM | POA: Diagnosis present

## 2021-08-06 DIAGNOSIS — J948 Other specified pleural conditions: Secondary | ICD-10-CM | POA: Diagnosis not present

## 2021-08-06 DIAGNOSIS — Y732 Prosthetic and other implants, materials and accessory gastroenterology and urology devices associated with adverse incidents: Secondary | ICD-10-CM | POA: Diagnosis present

## 2021-08-06 DIAGNOSIS — J90 Pleural effusion, not elsewhere classified: Secondary | ICD-10-CM | POA: Diagnosis not present

## 2021-08-06 DIAGNOSIS — E1151 Type 2 diabetes mellitus with diabetic peripheral angiopathy without gangrene: Secondary | ICD-10-CM | POA: Diagnosis present

## 2021-08-06 DIAGNOSIS — Z48813 Encounter for surgical aftercare following surgery on the respiratory system: Secondary | ICD-10-CM | POA: Diagnosis not present

## 2021-08-06 DIAGNOSIS — L89322 Pressure ulcer of left buttock, stage 2: Secondary | ICD-10-CM | POA: Diagnosis present

## 2021-08-06 DIAGNOSIS — I130 Hypertensive heart and chronic kidney disease with heart failure and stage 1 through stage 4 chronic kidney disease, or unspecified chronic kidney disease: Secondary | ICD-10-CM | POA: Diagnosis present

## 2021-08-06 DIAGNOSIS — I959 Hypotension, unspecified: Secondary | ICD-10-CM | POA: Diagnosis not present

## 2021-08-06 DIAGNOSIS — E1122 Type 2 diabetes mellitus with diabetic chronic kidney disease: Secondary | ICD-10-CM | POA: Diagnosis present

## 2021-08-06 DIAGNOSIS — I5022 Chronic systolic (congestive) heart failure: Secondary | ICD-10-CM | POA: Diagnosis not present

## 2021-08-06 DIAGNOSIS — I5043 Acute on chronic combined systolic (congestive) and diastolic (congestive) heart failure: Secondary | ICD-10-CM | POA: Diagnosis present

## 2021-08-06 DIAGNOSIS — I82409 Acute embolism and thrombosis of unspecified deep veins of unspecified lower extremity: Secondary | ICD-10-CM | POA: Diagnosis present

## 2021-08-06 DIAGNOSIS — Z936 Other artificial openings of urinary tract status: Secondary | ICD-10-CM

## 2021-08-06 DIAGNOSIS — M1990 Unspecified osteoarthritis, unspecified site: Secondary | ICD-10-CM | POA: Diagnosis present

## 2021-08-06 DIAGNOSIS — E1159 Type 2 diabetes mellitus with other circulatory complications: Secondary | ICD-10-CM | POA: Diagnosis not present

## 2021-08-06 DIAGNOSIS — D649 Anemia, unspecified: Secondary | ICD-10-CM | POA: Diagnosis not present

## 2021-08-06 DIAGNOSIS — R319 Hematuria, unspecified: Secondary | ICD-10-CM | POA: Diagnosis not present

## 2021-08-06 DIAGNOSIS — M6281 Muscle weakness (generalized): Secondary | ICD-10-CM | POA: Diagnosis not present

## 2021-08-06 DIAGNOSIS — I255 Ischemic cardiomyopathy: Secondary | ICD-10-CM | POA: Diagnosis present

## 2021-08-06 DIAGNOSIS — T83092A Other mechanical complication of nephrostomy catheter, initial encounter: Secondary | ICD-10-CM | POA: Diagnosis not present

## 2021-08-06 DIAGNOSIS — Z8673 Personal history of transient ischemic attack (TIA), and cerebral infarction without residual deficits: Secondary | ICD-10-CM

## 2021-08-06 DIAGNOSIS — Z7901 Long term (current) use of anticoagulants: Secondary | ICD-10-CM

## 2021-08-06 DIAGNOSIS — I2699 Other pulmonary embolism without acute cor pulmonale: Secondary | ICD-10-CM | POA: Diagnosis present

## 2021-08-06 DIAGNOSIS — I5082 Biventricular heart failure: Secondary | ICD-10-CM | POA: Diagnosis present

## 2021-08-06 DIAGNOSIS — Z515 Encounter for palliative care: Secondary | ICD-10-CM

## 2021-08-06 DIAGNOSIS — E875 Hyperkalemia: Secondary | ICD-10-CM | POA: Diagnosis present

## 2021-08-06 DIAGNOSIS — I509 Heart failure, unspecified: Secondary | ICD-10-CM | POA: Diagnosis not present

## 2021-08-06 DIAGNOSIS — R188 Other ascites: Secondary | ICD-10-CM | POA: Diagnosis not present

## 2021-08-06 DIAGNOSIS — E119 Type 2 diabetes mellitus without complications: Secondary | ICD-10-CM

## 2021-08-06 DIAGNOSIS — Z8249 Family history of ischemic heart disease and other diseases of the circulatory system: Secondary | ICD-10-CM

## 2021-08-06 DIAGNOSIS — R531 Weakness: Secondary | ICD-10-CM | POA: Diagnosis not present

## 2021-08-06 DIAGNOSIS — Z89611 Acquired absence of right leg above knee: Secondary | ICD-10-CM | POA: Diagnosis not present

## 2021-08-06 DIAGNOSIS — R7401 Elevation of levels of liver transaminase levels: Secondary | ICD-10-CM | POA: Diagnosis present

## 2021-08-06 DIAGNOSIS — Z952 Presence of prosthetic heart valve: Secondary | ICD-10-CM

## 2021-08-06 DIAGNOSIS — S301XXA Contusion of abdominal wall, initial encounter: Secondary | ICD-10-CM | POA: Diagnosis not present

## 2021-08-06 DIAGNOSIS — I493 Ventricular premature depolarization: Secondary | ICD-10-CM | POA: Diagnosis present

## 2021-08-06 DIAGNOSIS — N135 Crossing vessel and stricture of ureter without hydronephrosis: Secondary | ICD-10-CM | POA: Diagnosis not present

## 2021-08-06 DIAGNOSIS — Z951 Presence of aortocoronary bypass graft: Secondary | ICD-10-CM

## 2021-08-06 LAB — CBC WITH DIFFERENTIAL/PLATELET
Abs Immature Granulocytes: 0.03 10*3/uL (ref 0.00–0.07)
Basophils Absolute: 0 10*3/uL (ref 0.0–0.1)
Basophils Relative: 0 %
Eosinophils Absolute: 0 10*3/uL (ref 0.0–0.5)
Eosinophils Relative: 0 %
HCT: 28 % — ABNORMAL LOW (ref 39.0–52.0)
Hemoglobin: 8.6 g/dL — ABNORMAL LOW (ref 13.0–17.0)
Immature Granulocytes: 1 %
Lymphocytes Relative: 5 %
Lymphs Abs: 0.3 10*3/uL — ABNORMAL LOW (ref 0.7–4.0)
MCH: 22.6 pg — ABNORMAL LOW (ref 26.0–34.0)
MCHC: 30.7 g/dL (ref 30.0–36.0)
MCV: 73.7 fL — ABNORMAL LOW (ref 80.0–100.0)
Monocytes Absolute: 0.2 10*3/uL (ref 0.1–1.0)
Monocytes Relative: 3 %
Neutro Abs: 5.2 10*3/uL (ref 1.7–7.7)
Neutrophils Relative %: 91 %
Platelets: 197 10*3/uL (ref 150–400)
RBC: 3.8 MIL/uL — ABNORMAL LOW (ref 4.22–5.81)
RDW: 26.3 % — ABNORMAL HIGH (ref 11.5–15.5)
WBC: 5.7 10*3/uL (ref 4.0–10.5)
nRBC: 0 % (ref 0.0–0.2)

## 2021-08-06 LAB — BASIC METABOLIC PANEL
Anion gap: 12 (ref 5–15)
BUN: 46 mg/dL — ABNORMAL HIGH (ref 8–23)
CO2: 18 mmol/L — ABNORMAL LOW (ref 22–32)
Calcium: 8.7 mg/dL — ABNORMAL LOW (ref 8.9–10.3)
Chloride: 108 mmol/L (ref 98–111)
Creatinine, Ser: 1.69 mg/dL — ABNORMAL HIGH (ref 0.61–1.24)
GFR, Estimated: 41 mL/min — ABNORMAL LOW (ref >60)
Glucose, Bld: 140 mg/dL — ABNORMAL HIGH (ref 70–99)
Potassium: 4.6 mmol/L (ref 3.5–5.1)
Sodium: 138 mmol/L (ref 135–145)

## 2021-08-06 LAB — RESP PANEL BY RT-PCR (FLU A&B, COVID) ARPGX2
Influenza A by PCR: NEGATIVE
Influenza B by PCR: NEGATIVE
SARS Coronavirus 2 by RT PCR: NEGATIVE

## 2021-08-06 MED ORDER — ATORVASTATIN CALCIUM 40 MG PO TABS
40.0000 mg | ORAL_TABLET | Freq: Every day | ORAL | Status: DC
  Administered 2021-08-07 – 2021-08-17 (×10): 40 mg via ORAL
  Filled 2021-08-06 (×10): qty 1

## 2021-08-06 MED ORDER — ACETAMINOPHEN 325 MG PO TABS
650.0000 mg | ORAL_TABLET | Freq: Four times a day (QID) | ORAL | Status: DC | PRN
  Administered 2021-08-11 – 2021-08-17 (×4): 650 mg via ORAL
  Filled 2021-08-06 (×5): qty 2

## 2021-08-06 MED ORDER — SODIUM CHLORIDE 0.9 % IV BOLUS: 1000.0000 mL | Freq: Once | INTRAVENOUS | Status: DC

## 2021-08-06 MED ORDER — APIXABAN 5 MG PO TABS
5.0000 mg | ORAL_TABLET | Freq: Two times a day (BID) | ORAL | Status: DC
  Administered 2021-08-07 – 2021-08-18 (×23): 5 mg via ORAL
  Filled 2021-08-06 (×25): qty 1

## 2021-08-06 MED ORDER — AMIODARONE HCL 200 MG PO TABS
200.0000 mg | ORAL_TABLET | Freq: Every day | ORAL | Status: DC
  Administered 2021-08-07 – 2021-08-18 (×12): 200 mg via ORAL
  Filled 2021-08-06 (×12): qty 1

## 2021-08-06 MED ORDER — MIDODRINE HCL 5 MG PO TABS
10.0000 mg | ORAL_TABLET | Freq: Three times a day (TID) | ORAL | Status: DC
  Administered 2021-08-07 – 2021-08-18 (×35): 10 mg via ORAL
  Filled 2021-08-06 (×38): qty 2

## 2021-08-06 MED ORDER — INSULIN ASPART 100 UNIT/ML IJ SOLN
0.0000 [IU] | Freq: Three times a day (TID) | INTRAMUSCULAR | Status: DC
  Administered 2021-08-10 – 2021-08-12 (×3): 1 [IU] via SUBCUTANEOUS
  Filled 2021-08-06: qty 0.09

## 2021-08-06 MED ORDER — BISACODYL 5 MG PO TBEC
5.0000 mg | DELAYED_RELEASE_TABLET | Freq: Every day | ORAL | Status: DC | PRN
  Administered 2021-08-18: 5 mg via ORAL
  Filled 2021-08-06: qty 1

## 2021-08-06 NOTE — ED Provider Notes (Addendum)
[facility] DEPT Provider Note   CSN: [id number] Arrival date & time: 08/14/2021  1940     History Chief Complaint  Patient presents with   Nephrostomy tube dislodged    Ryan [name] is a 75 y.o. male.  Presenting to ER with concern for damage to his nephrostomy tube.  Initial triage note from RN states that transportation service had reported that patient had a dislodged Foley catheter that came out while transferring per staff.  Patient states that he does not have a Foley catheter and instead has a nephrostomy tube.  States that while he was getting transfer the nephrostomy tube was broken.  Patient states that he has no medical complaints at present otherwise.  No fevers or chills, no abdominal pain.  RN reported the patient had wet diaper on arrival.   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  HPI     Past Medical History:  Diagnosis Date   Acute pericarditis 09/16/2012   Possible purulent pericarditis in setting of staph bacteremia   AICD (automatic cardioverter/defibrillator) present    Arthritis    Atrial fibrillation (HCC)    Cardiomyopathy, dilated (HCC)    CHF (congestive heart failure) ([place])    Diabetes mellitus without complication ([place])    DVT of left external iliac vein 04/2021 06/06/2021   Endocarditis    Hypertension    Peripheral vascular disease ([place])    Pulmonary emboli 04/2021 06/06/2021   PVC's (premature ventricular contractions)    SVT (supraventricular tachycardia)  long RP     Patient Active Problem List   Diagnosis Date Noted   Altered mental status 07/01/2021   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   Multiple pulmonary nodules 62/37/6283   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[place]) 06/06/2021   Normocytic anemia 06/06/2021   DVT  of left external iliac vein 04/2021 06/06/2021   Pulmonary emboli 04/2021 06/06/2021   Pressure injury of skin 04/22/2021   Popliteal artery occlusion, 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   PAD (peripheral artery disease) ([place]) 03/06/2021   Acute on chronic combined systolic and diastolic CHF (congestive heart failure) ([place]) 09/06/2020   CHF (congestive heart failure) ([place]) 08/26/2020   Acute combined systolic and diastolic CHF, NYHA class 3 ([place])    Stage 3 chronic kidney disease ([place]) - baseline SCr 1.1    Onychomycosis of toenail 04/23/2020   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   PVC's (premature ventricular contractions) 06/12/2016   Junctional tachycardia ([place]) 09/16/2015   AKI (acute kidney injury) ([place]) 08/30/2015   SVT (supraventricular tachycardia)  long RP    Atherosclerosis of native arteries of the extremities with ulceration ([place]) 10/23/2012   Essential hypertension 09/29/2012    Class: Chronic   Type 2 diabetes mellitus ([place]) 09/28/2012   Status post mitral valve replacement with bioprosthetic valve 09/19/2012    Class: Acute   Chronic systolic heart failure (HCC) - LVEF 25 to 30% 09/16/2012    Class: Acute   Peripheral vascular disease ([place]) 09/15/2012   Sepsis with acute renal failure without septic shock ([place]) 09/15/2012   Acute ischemic stroke ([place]) 09/15/2012    Past Surgical History:  Procedure Laterality Date   ABDOMINAL AORTAGRAM N/A 10/03/2012   Procedure: ABDOMINAL Maxcine [place];  Surgeon: [name], MD;  Location: [facility] CATH LAB;  Service:  Cardiovascular;  Laterality: N/A;   ABDOMINAL AORTOGRAM W/LOWER EXTREMITY N/A 02/25/2021   Procedure: ABDOMINAL AORTOGRAM W/LOWER EXTREMITY;  Surgeon: Cherre Robins, MD;  Location: [facility] CV LAB;  Service: Cardiovascular;  Laterality: N/A;   ABDOMINAL AORTOGRAM W/LOWER EXTREMITY N/A 04/13/2021   Procedure: ABDOMINAL AORTOGRAM W/LOWER EXTREMITY;  Surgeon: Waynetta Sandy, MD;  Location: [facility] CV LAB;  Service: Cardiovascular;  Laterality: N/A;   AMPUTATION Right 05/12/2021   Procedure: AMPUTATION ABOVE KNEE RIGHT;  Surgeon: Angelia Mould, MD;  Location: [facility];  Service: Vascular;  Laterality: Right;   AMPUTATION Left 07/07/2021   Procedure: LEFT ABOVE KNEE AMPUTATION;  Surgeon: Broadus John, MD;  Location: [facility];  Service: Vascular;  Laterality: Left;   APPLICATION OF WOUND VAC Right 04/09/2021   Procedure: APPLICATION OF WOUND VAC;  Surgeon: Cherre Robins, MD;  Location: [facility];  Service: Vascular;  Laterality: Right;   BYPASS GRAFT FEMORAL-PERONEAL Right 03/06/2021   Procedure: RIGHT ABOVE KNEE POPLITEAL ARTERY-PERONEAL BYPASS;  Surgeon: Cherre Robins, MD;  Location: [facility];  Service: Vascular;  Laterality: Right;   CORONARY ANGIOGRAM  09/21/2012   Procedure: CORONARY ANGIOGRAM;  Surgeon: [name], MD;  Location: [facility] CATH LAB;  Service: Cardiovascular;;   EP IMPLANTABLE DEVICE N/A 06/11/2016   Procedure: ICD Implant;  Surgeon: Will Meredith Leeds, MD;  Location: [facility] CV LAB;  Service: Cardiovascular;  Laterality: N/A;   EXTREMITY WIRE/PIN REMOVAL  09/14/2012   Procedure: REMOVAL K-WIRE/PIN EXTREMITY;  Surgeon: [name], MD;  Location: [facility];  Service: Orthopedics;  Laterality: Right;  Right Foot   I & D EXTREMITY  09/14/2012   Procedure: IRRIGATION AND DEBRIDEMENT EXTREMITY;  Surgeon: Tennis Must, MD;  Location: [facility];  Service: Orthopedics;  Laterality: Right;   INTRAOPERATIVE TRANSESOPHAGEAL ECHOCARDIOGRAM  09/26/2012   Procedure: INTRAOPERATIVE TRANSESOPHAGEAL ECHOCARDIOGRAM;  Surgeon: [name], MD;  Location: [facility] OR;  Service: Open Heart Surgery;  Laterality: N/A;   IR FLUORO GUIDE CV LINE RIGHT  04/20/2021   IR NEPHROSTOMY EXCHANGE LEFT  06/07/2021   IR NEPHROSTOMY PLACEMENT LEFT  04/20/2021   IR US GUIDE VASC ACCESS RIGHT  04/20/2021   MITRAL VALVE REPLACEMENT  09/26/2012   Procedure: MITRAL VALVE (MV) REPLACEMENT;  Surgeon: [name], MD;  Location: [facility];  Service: Open Heart Surgery;  Laterality: N/A;   RIGHT HEART CATH N/A 08/29/2020   Procedure: RIGHT HEART CATH;  Surgeon: Larey Dresser, MD;  Location: [facility] CV LAB;  Service: Cardiovascular;  Laterality: N/A;   RIGHT HEART CATHETERIZATION  09/21/2012   Procedure: RIGHT HEART CATH;  Surgeon: [name], MD;  Location: [facility] CATH LAB;  Service: Cardiovascular;;   RIGHT/LEFT HEART CATH AND CORONARY ANGIOGRAPHY N/A 01/30/2021   Procedure: RIGHT/LEFT HEART CATH AND CORONARY ANGIOGRAPHY;  Surgeon: Larey Dresser, MD;  Location: [facility] CV LAB;  Service: Cardiovascular;  Laterality: N/A;   SVT ABLATION N/A 09/03/2020   Procedure: SVT ABLATION;  Surgeon: Constance Haw, MD;  Location: [facility] CV LAB;  Service: Cardiovascular;  Laterality: N/A;   TEE WITHOUT CARDIOVERSION  09/18/2012   Procedure: TRANSESOPHAGEAL ECHOCARDIOGRAM (TEE);  Surgeon: [name], MD;  Location: [facility] ENDOSCOPY;  Service: Cardiovascular;  Laterality: N/A;   WOUND DEBRIDEMENT Right 04/09/2021   Procedure: DEBRIDEMENT RIGHT HEEL WOUND AND PARTIAL FIRST TOE AMPUTATION AND SECOND TOE AMPUTATION. Application of Myriad skin substitute;  Surgeon: Cherre Robins, MD;  Location: [facility];  Service: Vascular;  Laterality: Right;       Family History  Problem Relation Age of Onset   Hypertension Mother    Hypertension Father     Social History   Tobacco Use   Smoking status: Never   Smokeless tobacco: Never  Vaping Use   Vaping Use: Never used  Substance Use Topics   Alcohol use: No    Alcohol/week: 0.0 standard drinks   Drug use: No    Home Medications Prior to Admission medications   Medication Sig Start Date End Date Taking? Authorizing Provider  acetaminophen (TYLENOL) 325 MG tablet Take 650 mg by mouth every 6 (six) hours as needed for mild pain or headache.    [provider]  Amino Acids-Protein Hydrolys (FEEDING SUPPLEMENT, PRO-STAT SUGAR FREE 64,)  LIQD Take 30 mLs by mouth 3 (three) times daily. Take with 4 oz juice    [provider]  amiodarone (PACERONE) 200 MG tablet TAKE 1 TABLET BY MOUTH EVERY DAY Patient taking differently: Take 200 mg by mouth daily. 03/03/21   Bhagat, Sharrell Ku, PA  apixaban (ELIQUIS) 5 MG TABS tablet Take 1 tablet (5 mg total) by mouth every 12 (twelve) hours. 05/22/21   [place], Eric J, DO  atorvastatin (LIPITOR) 40 MG tablet Take 1 tablet (40 mg total) by mouth daily at 6 PM. Patient taking differently: Take 40 mg by mouth at bedtime. 10/06/12   Elenora Gamma, MD  bisacodyl (DULCOLAX) 5 MG EC tablet Take 5 mg by mouth daily as needed (constipation).    [provider]  docusate sodium (COLACE) 100 MG capsule Take 100 mg by mouth daily.    [provider]  Ensure (ENSURE) Take 237 mLs by mouth in the morning, at noon, and at bedtime.    [provider]  furosemide (LASIX) 20 MG tablet Take 1 tablet (20 mg total) by mouth every other day. 07/15/21   Joseph Art, DO  insulin aspart (NOVOLOG) 100 UNIT/ML injection Inject 0-6 Units into the skin 3 (three) times daily with meals. 07/14/21   Joseph Art, DO  midodrine (PROAMATINE) 10 MG tablet Take 1 tablet (10 mg total) by mouth 3 (three) times daily with meals. 05/22/21   [place], Alvira Philips, DO  oxyCODONE (OXY IR/ROXICODONE) 5 MG immediate release tablet Take 1 tablet (5 mg total) by mouth every 6 (six) hours as needed for severe pain. 07/14/21   Joseph Art, DO    Allergies    [place] [employer] allergy]  Review of Systems   Review of Systems  Constitutional:  Negative for chills and fever.  HENT:  Negative for ear pain and sore throat.   Eyes:  Negative for pain and visual disturbance.  Respiratory:  Negative for cough and shortness of breath.   Cardiovascular:  Negative for chest pain and palpitations.  Gastrointestinal:  Negative for abdominal pain and vomiting.  Genitourinary:  Negative for dysuria and hematuria.   Musculoskeletal:  Negative for arthralgias and back pain.  Skin:  Negative for color change and rash.  Neurological:  Negative for seizures and syncope.  All other systems reviewed and are negative.  Physical Exam Updated Vital Signs BP 121/75   Pulse 71   Temp 98.5 F (36.9 C)   Resp 18   SpO2 99%   Physical Exam Vitals and nursing note reviewed.  Constitutional:      General: He is not in acute distress.    Appearance: He is well-developed.  HENT:  Head: Normocephalic and atraumatic.  Eyes:     Conjunctiva/sclera: Conjunctivae normal.  Cardiovascular:     Rate and Rhythm: Normal rate and regular rhythm.     Heart sounds: No murmur heard. Pulmonary:     Effort: Pulmonary effort is normal. No respiratory distress.     Breath sounds: Normal breath sounds.  Abdominal:     Palpations: Abdomen is soft.     Tenderness: There is no abdominal tenderness.  Musculoskeletal:        General: No swelling.     Cervical back: Neck supple.     Comments: Back: the nephrostomy tube site at the skin appears intact however the tube itself is broken  Skin:    General: Skin is warm and dry.     Capillary Refill: Capillary refill takes less than 2 seconds.  Neurological:     Mental Status: He is alert.  Psychiatric:        Mood and Affect: Mood normal.    ED Results / Procedures / Treatments   Labs (all labs ordered are listed, but only abnormal results are displayed) Labs Reviewed  CBC WITH DIFFERENTIAL/PLATELET - Abnormal; Notable for the following components:      Result Value   RBC 3.80 (*)    Hemoglobin 8.6 (*)    HCT 28.0 (*)    MCV 73.7 (*)    MCH 22.6 (*)    RDW 26.3 (*)    Lymphs Abs 0.3 (*)    All other components within normal limits  BASIC METABOLIC PANEL - Abnormal; Notable for the following components:   CO2 18 (*)    Glucose, Bld 140 (*)    BUN 46 (*)    Creatinine, Ser 1.69 (*)    Calcium 8.7 (*)    GFR, Estimated 41 (*)    All other components within  normal limits  RESP PANEL BY RT-PCR (FLU A&B, COVID) ARPGX2  URINALYSIS, ROUTINE W REFLEX MICROSCOPIC  PROTIME-INR    EKG None  Radiology No results found.  Procedures Procedures   Medications Ordered in ED Medications  acetaminophen (TYLENOL) tablet 650 mg (has no administration in time range)  amiodarone (PACERONE) tablet 200 mg (has no administration in time range)  apixaban (ELIQUIS) tablet 5 mg (0 mg Oral Hold 08/10/2021 2320)  atorvastatin (LIPITOR) tablet 40 mg (has no administration in time range)  bisacodyl (DULCOLAX) EC tablet 5 mg (has no administration in time range)  midodrine (PROAMATINE) tablet 10 mg (has no administration in time range)  insulin aspart (novoLOG) injection 0-9 Units (has no administration in time range)  sodium chloride 0.9 % bolus 500 mL (has no administration in time range)    ED Course  I have reviewed the triage vital signs and the nursing notes.  Pertinent labs & imaging results that were available during my care of the patient were reviewed by me and considered in my medical decision making (see chart for details).    MDM Rules/Calculators/A&P                         75 year old male presenting to the emergency room for concern issue with his nephrostomy tube.  Nephrostomy tube appears to be broken, still draining urine.  Discussed case with urology, Dr. Abner Greenspan recommends discussing with interventional radiology.  Discussed with Dr. Laurence Ferrari with interventional radiology.  He recommends admitting to the hospital service, his team will evaluate in the morning and replace tube.  Placed consult to  hospitalist.  Dr. Alcario Drought with hospitalist service declines admission, he request that patient be managed by ER provider in the morning and discharged from ER after getting the IR procedure.  Will discuss case with Dr. Betsey Holiday, night doc. Have ordered pt home meds.  Basic labs noted for elevation in creatinine and BUN.  Will give gentle fluid bolus  for now, has hx of HF. Will ask Dr. Alcario Drought with the hospitalist service to reconsider if patient would qualify for admission given this AKI.  Dr. Alcario Drought said he would re review case.    Final Clinical Impression(s) / ED Diagnoses Final diagnoses:  Nephrostomy complication ([place])  AKI (acute kidney injury) [facility])    Rx / [place] Orders ED Discharge Orders     None        Lucrezia Starch, MD 07/21/2021 2154    Lucrezia Starch, MD 08/14/2021 2223    Lucrezia Starch, MD 08/07/21 0003

## 2021-08-06 NOTE — ED Notes (Signed)
Purewick applied.

## 2021-08-06 NOTE — ED Notes (Signed)
Correction - L nephrostomy tube dislodged

## 2021-08-06 NOTE — ED Triage Notes (Signed)
Pt in from [facility] with c/o dislodged foley catheter. Came out while transferring per staff. Pt states Urology had to place it last time. Urine present in brief

## 2021-08-07 ENCOUNTER — Observation Stay (HOSPITAL_COMMUNITY): Payer: Medicare HMO

## 2021-08-07 ENCOUNTER — Encounter (HOSPITAL_COMMUNITY): Payer: Self-pay | Admitting: Internal Medicine

## 2021-08-07 ENCOUNTER — Other Ambulatory Visit: Payer: Self-pay

## 2021-08-07 DIAGNOSIS — Z936 Other artificial openings of urinary tract status: Secondary | ICD-10-CM | POA: Diagnosis not present

## 2021-08-07 DIAGNOSIS — N135 Crossing vessel and stricture of ureter without hydronephrosis: Secondary | ICD-10-CM | POA: Diagnosis not present

## 2021-08-07 DIAGNOSIS — Z86718 Personal history of other venous thrombosis and embolism: Secondary | ICD-10-CM | POA: Diagnosis not present

## 2021-08-07 DIAGNOSIS — N99528 Other complication of other external stoma of urinary tract: Secondary | ICD-10-CM

## 2021-08-07 DIAGNOSIS — Z86711 Personal history of pulmonary embolism: Secondary | ICD-10-CM | POA: Diagnosis not present

## 2021-08-07 DIAGNOSIS — Z794 Long term (current) use of insulin: Secondary | ICD-10-CM | POA: Diagnosis not present

## 2021-08-07 DIAGNOSIS — I5022 Chronic systolic (congestive) heart failure: Secondary | ICD-10-CM | POA: Diagnosis not present

## 2021-08-07 DIAGNOSIS — N179 Acute kidney failure, unspecified: Secondary | ICD-10-CM | POA: Diagnosis not present

## 2021-08-07 DIAGNOSIS — E1159 Type 2 diabetes mellitus with other circulatory complications: Secondary | ICD-10-CM

## 2021-08-07 DIAGNOSIS — R7989 Other specified abnormal findings of blood chemistry: Secondary | ICD-10-CM | POA: Diagnosis not present

## 2021-08-07 HISTORY — PX: IR NEPHROSTOMY TUBE CHANGE: IMG1442

## 2021-08-07 LAB — URINALYSIS, ROUTINE W REFLEX MICROSCOPIC
Bilirubin Urine: NEGATIVE
Glucose, UA: NEGATIVE mg/dL
Hgb urine dipstick: NEGATIVE
Ketones, ur: NEGATIVE mg/dL
Nitrite: NEGATIVE
Protein, ur: 30 mg/dL — AB
Specific Gravity, Urine: 1.019 (ref 1.005–1.030)
pH: 5 (ref 5.0–8.0)

## 2021-08-07 LAB — FERRITIN: Ferritin: 975 ng/mL — ABNORMAL HIGH (ref 24–336)

## 2021-08-07 LAB — IRON AND TIBC
Iron: 57 ug/dL (ref 45–182)
Saturation Ratios: 30 % (ref 17.9–39.5)
TIBC: 192 ug/dL — ABNORMAL LOW (ref 250–450)
UIBC: 135 ug/dL

## 2021-08-07 LAB — BASIC METABOLIC PANEL
Anion gap: 9 (ref 5–15)
BUN: 49 mg/dL — ABNORMAL HIGH (ref 8–23)
CO2: 19 mmol/L — ABNORMAL LOW (ref 22–32)
Calcium: 8.6 mg/dL — ABNORMAL LOW (ref 8.9–10.3)
Chloride: 110 mmol/L (ref 98–111)
Creatinine, Ser: 1.52 mg/dL — ABNORMAL HIGH (ref 0.61–1.24)
GFR, Estimated: 47 mL/min — ABNORMAL LOW (ref >60)
Glucose, Bld: 94 mg/dL (ref 70–99)
Potassium: 4.4 mmol/L (ref 3.5–5.1)
Sodium: 138 mmol/L (ref 135–145)

## 2021-08-07 LAB — CBC
HCT: 27.3 % — ABNORMAL LOW (ref 39.0–52.0)
Hemoglobin: 8.4 g/dL — ABNORMAL LOW (ref 13.0–17.0)
MCH: 22.5 pg — ABNORMAL LOW (ref 26.0–34.0)
MCHC: 30.8 g/dL (ref 30.0–36.0)
MCV: 73.2 fL — ABNORMAL LOW (ref 80.0–100.0)
Platelets: 191 10*3/uL (ref 150–400)
RBC: 3.73 MIL/uL — ABNORMAL LOW (ref 4.22–5.81)
RDW: 26.5 % — ABNORMAL HIGH (ref 11.5–15.5)
WBC: 6.9 10*3/uL (ref 4.0–10.5)
nRBC: 0 % (ref 0.0–0.2)

## 2021-08-07 LAB — GLUCOSE, CAPILLARY: Glucose-Capillary: 90 mg/dL (ref 70–99)

## 2021-08-07 LAB — CBG MONITORING, ED
Glucose-Capillary: 87 mg/dL (ref 70–99)
Glucose-Capillary: 71 mg/dL (ref 70–99)
Glucose-Capillary: 90 mg/dL (ref 70–99)

## 2021-08-07 MED ORDER — IOHEXOL 300 MG/ML  SOLN
50.0000 mL | Freq: Once | INTRAMUSCULAR | Status: AC | PRN
  Administered 2021-08-07: 10 mL

## 2021-08-07 MED ORDER — LACTATED RINGERS IV SOLN: INTRAVENOUS | Status: DC

## 2021-08-07 MED ORDER — LIDOCAINE HCL 1 % IJ SOLN
INTRAMUSCULAR | Status: AC
  Filled 2021-08-07: qty 20

## 2021-08-07 MED ORDER — SODIUM CHLORIDE 0.9 % IV BOLUS
500.0000 mL | Freq: Once | INTRAVENOUS | Status: AC
  Administered 2021-08-07: 500 mL via INTRAVENOUS

## 2021-08-07 MED ORDER — ONDANSETRON HCL 4 MG/2ML IJ SOLN
4.0000 mg | Freq: Four times a day (QID) | INTRAMUSCULAR | Status: DC | PRN
  Administered 2021-08-12 – 2021-08-16 (×2): 4 mg via INTRAVENOUS
  Filled 2021-08-07 (×2): qty 2

## 2021-08-07 MED ORDER — HYDRALAZINE HCL 20 MG/ML IJ SOLN: 10.0000 mg | INTRAMUSCULAR | Status: DC | PRN

## 2021-08-07 MED ORDER — METHOCARBAMOL 500 MG PO TABS
500.0000 mg | ORAL_TABLET | Freq: Two times a day (BID) | ORAL | Status: DC
  Administered 2021-08-07 – 2021-08-14 (×15): 500 mg via ORAL
  Filled 2021-08-07 (×16): qty 1

## 2021-08-07 MED ORDER — ONDANSETRON HCL 4 MG PO TABS
4.0000 mg | ORAL_TABLET | Freq: Four times a day (QID) | ORAL | Status: DC | PRN
  Administered 2021-08-07: 4 mg via ORAL
  Filled 2021-08-07: qty 1

## 2021-08-07 MED ORDER — IPRATROPIUM-ALBUTEROL 0.5-2.5 (3) MG/3ML IN SOLN
3.0000 mL | RESPIRATORY_TRACT | Status: DC | PRN
  Administered 2021-08-18: 3 mL via RESPIRATORY_TRACT
  Filled 2021-08-07: qty 3

## 2021-08-07 MED ORDER — METOPROLOL TARTRATE 5 MG/5ML IV SOLN: 5.0000 mg | INTRAVENOUS | Status: DC | PRN

## 2021-08-07 MED ORDER — GUAIFENESIN 100 MG/5ML PO LIQD
5.0000 mL | ORAL | Status: DC | PRN
  Administered 2021-08-08 – 2021-08-17 (×8): 5 mL via ORAL
  Filled 2021-08-07 (×9): qty 10

## 2021-08-07 MED ORDER — OXYCODONE HCL 5 MG PO TABS
5.0000 mg | ORAL_TABLET | ORAL | Status: DC | PRN
  Administered 2021-08-07 – 2021-08-17 (×12): 5 mg via ORAL
  Filled 2021-08-07 (×12): qty 1

## 2021-08-07 MED ORDER — GABAPENTIN 300 MG PO CAPS
300.0000 mg | ORAL_CAPSULE | Freq: Every day | ORAL | Status: DC
  Administered 2021-08-07 – 2021-08-13 (×7): 300 mg via ORAL
  Filled 2021-08-07 (×7): qty 1

## 2021-08-07 MED ORDER — SENNOSIDES-DOCUSATE SODIUM 8.6-50 MG PO TABS: 1.0000 | ORAL_TABLET | Freq: Every evening | ORAL | Status: DC | PRN

## 2021-08-07 NOTE — ED Notes (Signed)
Pt has nephrostomy tube that has become disconnected from tubing and is leaking, gauze dressing which was placed on arrival to protect pt from exposure is wet and pt gown is wet as well as pad under him.  Repositioned pt in bed and changed pad under him and gown. As I do not have tubing that will appropriately attach to nephrostomy tube I placed the nephrostomy tube into urine leg bag in order to prevent any further soiling from drainage from bag and more importantly to try and prevent exposure of tube to further contamination from environment. Advised pt to alert [place] if he feels any discomfort, pressure from parts of urine bag, repositioned pt in a way that would prevent pressure injury from this device.

## 2021-08-07 NOTE — ED Notes (Signed)
Pt calls out and states that he can not get comfortable, requests to sit on side of bed.  Pt helped to sit up and noted that nephrostomy tube has again been pulled and dressing has come loose and legbag which is in place to protect severed nephrostomy tube has come undone.  Replaced all and applied ABD pad to area where nephrostomy tube is.  Some blood tinged urine noted at this time and advised pt that I am concerned about getting him into a position of comfort to avoid any further movement that will cause tugging at the nephrostomy tube.  Chuck and drawsheet under pt replaced with fresh ones due to urine leakage on them.  Pt positioned in a sitting position with pillows per request.

## 2021-08-07 NOTE — H&P (Addendum)
History and Physical    Ryan [name] RFF:[id number] DOB: [birth year] DOA: 08/16/2021  PCP: [name], MD  Patient coming from: [facility]  I have personally briefly reviewed patient's old medical records in [place]  Chief Complaint: Nephrostomy tube broke  HPI: Ryan [name] is a 75 y.o. male with medical history significant of HFrEF (25%), HTN, DM2, PVD s/p B AKAs.  DVT and PE in Aug, A.Fib, pt on eliquis.  Pt has L nephrostomy tube due to L hydronephrosis from large L retroperitoneal hematoma 04/20/21.  Planned for at least 2 months of nephrostomy tube.  Pt presents to ED today because nephrostomy tube broke.  This occurred during transfer.  No other medical complaints.  No fever, chills, abd pain.   ED Course: Has AKI today with creat 1.6 up from 1.0 baseline.  BUN 46, bicarb 18.  HGB 8.6 today is actually up from prior baseline.   Review of Systems: As per HPI, otherwise all review of systems negative.  Past Medical History:  Diagnosis Date   Acute pericarditis 09/16/2012   Possible purulent pericarditis in setting of staph bacteremia   AICD (automatic cardioverter/defibrillator) present    Arthritis    Atrial fibrillation (HCC)    Cardiomyopathy, dilated (HCC)    CHF (congestive heart failure) (HCC)    Diabetes mellitus without complication ([place])    DVT of left external iliac vein 04/2021 06/06/2021   Endocarditis    Hypertension    Peripheral vascular disease ([place])    Pulmonary emboli 04/2021 06/06/2021   PVC's (premature ventricular contractions)    SVT (supraventricular tachycardia)  long RP     Past Surgical History:  Procedure Laterality Date   ABDOMINAL AORTAGRAM N/A 10/03/2012   Procedure: ABDOMINAL Maxcine Ham;  Surgeon: [name], MD;  Location: [facility] CATH LAB;  Service: Cardiovascular;  Laterality: N/A;   ABDOMINAL AORTOGRAM W/LOWER EXTREMITY N/A 02/25/2021   Procedure: ABDOMINAL AORTOGRAM W/LOWER EXTREMITY;  Surgeon: Cherre Robins,  MD;  Location: [place] CV LAB;  Service: Cardiovascular;  Laterality: N/A;   ABDOMINAL AORTOGRAM W/LOWER EXTREMITY N/A 04/13/2021   Procedure: ABDOMINAL AORTOGRAM W/LOWER EXTREMITY;  Surgeon: Waynetta Sandy, MD;  Location: [place] CV LAB;  Service: Cardiovascular;  Laterality: N/A;   AMPUTATION Right 05/12/2021   Procedure: AMPUTATION ABOVE KNEE RIGHT;  Surgeon: Angelia Mould, MD;  Location: [place];  Service: Vascular;  Laterality: Right;   AMPUTATION Left 07/07/2021   Procedure: LEFT ABOVE KNEE AMPUTATION;  Surgeon: Broadus John, MD;  Location: [place];  Service: Vascular;  Laterality: Left;   APPLICATION OF WOUND VAC Right 04/09/2021   Procedure: APPLICATION OF WOUND VAC;  Surgeon: Cherre Robins, MD;  Location: [place];  Service: Vascular;  Laterality: Right;   BYPASS GRAFT FEMORAL-PERONEAL Right 03/06/2021   Procedure: RIGHT ABOVE KNEE POPLITEAL ARTERY-PERONEAL BYPASS;  Surgeon: Cherre Robins, MD;  Location: [place];  Service: Vascular;  Laterality: Right;   CORONARY ANGIOGRAM  09/21/2012   Procedure: CORONARY ANGIOGRAM;  Surgeon: [name], MD;  Location: [facility] CATH LAB;  Service: Cardiovascular;;   EP IMPLANTABLE DEVICE N/A 06/11/2016   Procedure: ICD Implant;  Surgeon: Will Meredith Leeds, MD;  Location: [place] CV LAB;  Service: Cardiovascular;  Laterality: N/A;   EXTREMITY WIRE/PIN REMOVAL  09/14/2012   Procedure: REMOVAL K-WIRE/PIN EXTREMITY;  Surgeon: [name], MD;  Location: [place];  Service: Orthopedics;  Laterality: Right;  Right Foot   I & D EXTREMITY  09/14/2012   Procedure: IRRIGATION AND DEBRIDEMENT EXTREMITY;  Surgeon: Tennis Must, MD;  Location: [place];  Service: Orthopedics;  Laterality: Right;   INTRAOPERATIVE TRANSESOPHAGEAL ECHOCARDIOGRAM  09/26/2012   Procedure: INTRAOPERATIVE TRANSESOPHAGEAL ECHOCARDIOGRAM;  Surgeon: [name], MD;  Location[facility] OR;  Service: Open Heart Surgery;  Laterality: N/A;   IR FLUORO GUIDE CV LINE RIGHT   04/20/2021   IR NEPHROSTOMY EXCHANGE LEFT  06/07/2021   IR NEPHROSTOMY PLACEMENT LEFT  04/20/2021   IR US GUIDE VASC ACCESS RIGHT  04/20/2021   MITRAL VALVE REPLACEMENT  09/26/2012   Procedure: MITRAL VALVE (MV) REPLACEMENT;  Surgeon: [name], MD;  Location: [place];  Service: Open Heart Surgery;  Laterality: N/A;   RIGHT HEART CATH N/A 08/29/2020   Procedure: RIGHT HEART CATH;  Surgeon: Larey Dresser, MD;  Location: [place] CV LAB;  Service: Cardiovascular;  Laterality: N/A;   RIGHT HEART CATHETERIZATION  09/21/2012   Procedure: RIGHT HEART CATH;  Surgeon: [name], MD;  Location: [facility] CATH LAB;  Service: Cardiovascular;;   RIGHT/LEFT HEART CATH AND CORONARY ANGIOGRAPHY N/A 01/30/2021   Procedure: RIGHT/LEFT HEART CATH AND CORONARY ANGIOGRAPHY;  Surgeon: Larey Dresser, MD;  Location: [place] CV LAB;  Service: Cardiovascular;  Laterality: N/A;   SVT ABLATION N/A 09/03/2020   Procedure: SVT ABLATION;  Surgeon: Constance Haw, MD;  Location: [place] CV LAB;  Service: Cardiovascular;  Laterality: N/A;   TEE WITHOUT CARDIOVERSION  09/18/2012   Procedure: TRANSESOPHAGEAL ECHOCARDIOGRAM (TEE);  Surgeon: [name], MD;  Location: [facility] ENDOSCOPY;  Service: Cardiovascular;  Laterality: N/A;   WOUND DEBRIDEMENT Right 04/09/2021   Procedure: DEBRIDEMENT RIGHT HEEL WOUND AND PARTIAL FIRST TOE AMPUTATION AND SECOND TOE AMPUTATION. Application of Myriad skin substitute;  Surgeon: Cherre Robins, MD;  Location: [place];  Service: Vascular;  Laterality: Right;     reports that he has never smoked. He has never used smokeless tobacco. He reports that he does not drink alcohol and does not use drugs.  Allergies  Allergen Reactions   Geralyn Flash [Fish Allergy] Nausea And Vomiting    Family History  Problem Relation Age of Onset   Hypertension Mother    Hypertension Father      Prior to Admission medications   Medication Sig Start Date End Date Taking? Authorizing Provider   acetaminophen (TYLENOL) 325 MG tablet Take 650 mg by mouth every 6 (six) hours as needed for mild pain or headache.    [provider]  Amino Acids-Protein Hydrolys (FEEDING SUPPLEMENT, PRO-STAT SUGAR FREE 64,) LIQD Take 30 mLs by mouth 3 (three) times daily. Take with 4 oz juice    [provider]  amiodarone (PACERONE) 200 MG tablet TAKE 1 TABLET BY MOUTH EVERY DAY Patient taking differently: Take 200 mg by mouth daily. 03/03/21   Bhagat, Crista Luria, PA  apixaban (ELIQUIS) 5 MG TABS tablet Take 1 tablet (5 mg total) by mouth every 12 (twelve) hours. 05/22/21   [place] Eric J, DO  atorvastatin (LIPITOR) 40 MG tablet Take 1 tablet (40 mg total) by mouth daily at 6 PM. Patient taking differently: Take 40 mg by mouth at bedtime. 10/06/12   Timmothy Euler, MD  bisacodyl (DULCOLAX) 5 MG EC tablet Take 5 mg by mouth daily as needed (constipation).    [provider]  docusate sodium (COLACE) 100 MG capsule Take 100 mg by mouth daily.    [provider]  Ensure (ENSURE) Take 237 mLs by mouth  in the morning, at noon, and at bedtime.    [provider]  furosemide (LASIX) 20 MG tablet Take 1 tablet (20 mg total) by mouth every other day. 07/15/21   Geradine Girt, DO  insulin aspart (NOVOLOG) 100 UNIT/ML injection Inject 0-6 Units into the skin 3 (three) times daily with meals. 07/14/21   Geradine Girt, DO  midodrine (PROAMATINE) 10 MG tablet Take 1 tablet (10 mg total) by mouth 3 (three) times daily with meals. 05/22/21   [place] Donnamarie Poag, DO  oxyCODONE (OXY IR/ROXICODONE) 5 MG immediate release tablet Take 1 tablet (5 mg total) by mouth every 6 (six) hours as needed for severe pain. 07/14/21   Geradine Girt, DO    Physical Exam: Vitals:   08/16/2021 2230 08/10/2021 2300 08/03/2021 2330 08/07/21 0000  BP: 126/74 121/75 120/72 123/71  Pulse: 73 71 75 72  Resp:  18  18  Temp:      SpO2: 96% 99% 96% 93%    Constitutional: NAD, calm, comfortable Eyes:  PERRL, lids and conjunctivae normal ENMT: Mucous membranes are moist. Posterior pharynx clear of any exudate or lesions.Normal dentition.  Neck: normal, supple, no masses, no thyromegaly Respiratory: clear to auscultation bilaterally, no wheezing, no crackles. Normal respiratory effort. No accessory muscle use.  Cardiovascular: Regular rate and rhythm, no murmurs / rubs / gallops. No extremity edema. 2+ pedal pulses. No carotid bruits.  Abdomen: no tenderness, no masses palpated. No hepatosplenomegaly. Bowel sounds positive.  Musculoskeletal: B AKAs Skin:  Neurologic: CN 2-12 grossly intact. Sensation intact, DTR normal. Strength 5/5 in all 4.  Psychiatric: Normal judgment and insight. Alert and oriented x 3. Normal mood.    Labs on Admission: I have personally reviewed following labs and imaging studies  CBC: Recent Labs  Lab 07/28/2021 2206  WBC 5.7  NEUTROABS 5.2  HGB 8.6*  HCT 28.0*  MCV 73.7*  PLT 532   Basic Metabolic Panel: Recent Labs  Lab 08/04/2021 2206  NA 138  K 4.6  CL 108  CO2 18*  GLUCOSE 140*  BUN 46*  CREATININE 1.69*  CALCIUM 8.7*   GFR: CrCl cannot be calculated (Unknown ideal weight.). Liver Function Tests: No results for input(s): AST, ALT, ALKPHOS, BILITOT, PROT, ALBUMIN in the last 168 hours. No results for input(s): LIPASE, AMYLASE in the last 168 hours. No results for input(s): AMMONIA in the last 168 hours. Coagulation Profile: No results for input(s): INR, PROTIME in the last 168 hours. Cardiac Enzymes: No results for input(s): CKTOTAL, CKMB, CKMBINDEX, TROPONINI in the last 168 hours. BNP (last 3 results) Recent Labs    08/25/20 1121 03/27/21 1123  PROBNP 21,395* 3,146*   HbA1C: No results for input(s): HGBA1C in the last 72 hours. CBG: No results for input(s): GLUCAP in the last 168 hours. Lipid Profile: No results for input(s): CHOL, HDL, LDLCALC, TRIG, CHOLHDL, LDLDIRECT in the last 72 hours. Thyroid Function Tests: No results  for input(s): TSH, T4TOTAL, FREET4, T3FREE, THYROIDAB in the last 72 hours. Anemia Panel: No results for input(s): VITAMINB12, FOLATE, FERRITIN, TIBC, IRON, RETICCTPCT in the last 72 hours. Urine analysis:    Component Value Date/Time   COLORURINE AMBER (A) 07/01/2021 1505   APPEARANCEUR CLOUDY (A) 07/01/2021 1505   LABSPEC 1.021 07/01/2021 1505   PHURINE 5.0 07/01/2021 1505   GLUCOSEU 150 (A) 07/01/2021 1505   HGBUR LARGE (A) 07/01/2021 1505   BILIRUBINUR NEGATIVE 07/01/2021 [place] 07/01/2021 1505   PROTEINUR 100 (A) 07/01/2021  1505   UROBILINOGEN 1.0 09/12/2012 2055   NITRITE NEGATIVE 07/01/2021 1505   LEUKOCYTESUR MODERATE (A) 07/01/2021 1505    Radiological Exams on Admission: No results found.  EKG: Independently reviewed.  Assessment/Plan Principal Problem:   AKI (acute kidney injury) ([place]) Active Problems:   Chronic systolic heart failure (HCC) - LVEF 25 to 30%   Type 2 diabetes mellitus ([place])   Essential hypertension   DVT of left external iliac vein 04/2021   Pulmonary emboli 04/2021   Nephrostomy status (HCC)    AKI - Creat 1.69 today is up from 1.0 baseline. ? If related to dislodged nephrostomy tube? Though tube still draining.  Alternatively could be pre-renal. IVF Holding lasix Strict intake and output Repeat BMP in AM Broken nephrostomy tube - Still draining IR to eval in AM for replacement vs capping and void trial Hematoma was significantly improved on CT scan in Oct ? If able to get nephrostomy tube out? HTN - actually looks like hes got chronic hypotension, continue chronic midodrine H/o DVT /PE - Cont eliquis H/o A.Fib - Cont amiodarone and eliquis HLD - Cont statin DM2 - Sensitive SSI AC  DVT prophylaxis: Eliquis Code Status: Full Family Communication: No family in room Disposition Plan: Back to facility after admit Consults called: IR to see pt in AM Admission status: Place in obs   Ryan [name], [place]  Hospitalists  How to contact the [facility] Attending or Consulting provider [place] or covering provider during after hours [place], for this patient?  Check the care team in [facility] and look for a) attending/consulting TRH provider listed and b) the [facility] team listed Log into [email or web address]  Amion Physician Scheduling and messaging for groups and whole hospitals  On call and physician scheduling software for group practices, residents, hospitalists and other medical providers for call, clinic, rotation and shift schedules. OnCall [employer] is a hospital-wide system for scheduling doctors and paging doctors on call. EasyPlot is for scientific plotting and data analysis.  [email or web address]  and use [place]'s universal password to access. If you do not have the password, please contact the hospital operator.  Locate the [facility] provider you are looking for under [facility] and page to a number that you can be directly reached. If you still have difficulty reaching the provider, please page the [facility] (Director on Call) for the Hospitalists listed on amion for assistance.  08/07/2021, 1:32 AM

## 2021-08-07 NOTE — ED Notes (Signed)
Pt calls to be pulled up in bed, pt dressing around nephrostomy tube has been rolled up and leg bag around nephrostomy tube which was placed earlier has come undone.  Replaced this all and positioned pt for comfort.

## 2021-08-07 NOTE — ED Notes (Signed)
Pt asks to be repositioned, dressing which was recently placed has rolled up and been pulled.  New dressing applied and pt repositioned

## 2021-08-07 NOTE — ED Notes (Signed)
When I drew pt bloood he asked to have blanket placed under buttock, when I went to do this I noted that the nephrostomy tube had been pulled out, no bleeding, placed gauze dressing on site and placed nephrostomy tube in specimen bag at bedside.  Direct message to Dr. Reesa Chew.  Pt is in no distress

## 2021-08-07 NOTE — Progress Notes (Signed)
75 year old with history of systolic CHF EF 26%, HTN, DM2, PVD status post bilateral AKA, DVT/PE and atrial fibrillation on Eliquis has had left-sided nephrostomy tube for large retroperitoneal hematoma.  Came to the ED for broken tube which apparently occurred during transfer.  Also diagnosed with AKI with creatinine of 1.6 up from baseline of 1.0.  Patient seen in the ED, does not have any complaints.  He tells me he is not well rested overnight therefore feels very tired.  Vitals are stable at this time Does not appear to be in any acute distress.  His nephrostomy tube appears to be dislodged.  Abdomen is nontender nondistended, clear to auscultation bilaterally.  Bilateral AKA noted.  Left-sided nephrostomy tube malfunction-IR has been consulted Acute kidney injury-likely from dehydration.  Holding Lasix getting gentle hydration.  Baseline 1.0, admission 1.69 > 1.52 Microcytic anemia-chronic disease.  We will check his iron studies as well. DVT/PE-continue Eliquis Permanent atrial fibrillation-on amiodarone and Eliquis DM2-sliding scale and Accu-Cheks Hyperlipidemia-statin  Discussed with patient's RN  Gerlean Ren MD [facility]

## 2021-08-08 DIAGNOSIS — I9589 Other hypotension: Secondary | ICD-10-CM

## 2021-08-08 DIAGNOSIS — R319 Hematuria, unspecified: Secondary | ICD-10-CM

## 2021-08-08 DIAGNOSIS — N179 Acute kidney failure, unspecified: Secondary | ICD-10-CM | POA: Diagnosis not present

## 2021-08-08 DIAGNOSIS — T83098A Other mechanical complication of other indwelling urethral catheter, initial encounter: Secondary | ICD-10-CM

## 2021-08-08 DIAGNOSIS — E785 Hyperlipidemia, unspecified: Secondary | ICD-10-CM

## 2021-08-08 DIAGNOSIS — Z86718 Personal history of other venous thrombosis and embolism: Secondary | ICD-10-CM

## 2021-08-08 LAB — URINALYSIS, COMPLETE (UACMP) WITH MICROSCOPIC
Bilirubin Urine: NEGATIVE
Glucose, UA: NEGATIVE mg/dL
Ketones, ur: NEGATIVE mg/dL
Nitrite: NEGATIVE
Protein, ur: 100 mg/dL — AB
RBC / HPF: >50 RBC/hpf — ABNORMAL HIGH (ref 0–5)
Specific Gravity, Urine: 1.019 (ref 1.005–1.030)
pH: 5 (ref 5.0–8.0)

## 2021-08-08 LAB — GLUCOSE, CAPILLARY
Glucose-Capillary: 74 mg/dL (ref 70–99)
Glucose-Capillary: 89 mg/dL (ref 70–99)
Glucose-Capillary: 90 mg/dL (ref 70–99)
Glucose-Capillary: 107 mg/dL — ABNORMAL HIGH (ref 70–99)

## 2021-08-08 LAB — CBC
HCT: 28.3 % — ABNORMAL LOW (ref 39.0–52.0)
Hemoglobin: 8.8 g/dL — ABNORMAL LOW (ref 13.0–17.0)
MCH: 22.6 pg — ABNORMAL LOW (ref 26.0–34.0)
MCHC: 31.1 g/dL (ref 30.0–36.0)
MCV: 72.6 fL — ABNORMAL LOW (ref 80.0–100.0)
Platelets: 210 10*3/uL (ref 150–400)
RBC: 3.9 MIL/uL — ABNORMAL LOW (ref 4.22–5.81)
RDW: 27.8 % — ABNORMAL HIGH (ref 11.5–15.5)
WBC: 6.6 10*3/uL (ref 4.0–10.5)
nRBC: 0 % (ref 0.0–0.2)

## 2021-08-08 LAB — BASIC METABOLIC PANEL
Anion gap: 9 (ref 5–15)
BUN: 48 mg/dL — ABNORMAL HIGH (ref 8–23)
CO2: 18 mmol/L — ABNORMAL LOW (ref 22–32)
Calcium: 8.4 mg/dL — ABNORMAL LOW (ref 8.9–10.3)
Chloride: 112 mmol/L — ABNORMAL HIGH (ref 98–111)
Creatinine, Ser: 1.42 mg/dL — ABNORMAL HIGH (ref 0.61–1.24)
GFR, Estimated: 51 mL/min — ABNORMAL LOW (ref >60)
Glucose, Bld: 66 mg/dL — ABNORMAL LOW (ref 70–99)
Potassium: 4.5 mmol/L (ref 3.5–5.1)
Sodium: 139 mmol/L (ref 135–145)

## 2021-08-08 LAB — PROTIME-INR
INR: 3.5 — ABNORMAL HIGH (ref 0.8–1.2)
Prothrombin Time: 35.3 seconds — ABNORMAL HIGH (ref 11.4–15.2)

## 2021-08-08 LAB — MRSA NEXT GEN BY PCR, NASAL: MRSA by PCR Next Gen: DETECTED — AB

## 2021-08-08 LAB — MAGNESIUM: Magnesium: 2.4 mg/dL (ref 1.7–2.4)

## 2021-08-08 NOTE — NC FL2 (Signed)
[place] LEVEL OF CARE SCREENING TOOL     IDENTIFICATION  Patient Name: Ryan [name] Birthdate: Mar 13, 1946 Sex: male Admission Date (Current Location): 07/30/2021  [facility] and [place] Number:      Facility and Address:  [facility],  [place], [place]      Provider Number: [id number]  Attending Physician Name and Address:  Elodia Florence., *  Relative Name and Phone Number:  Dahlia Byes Daughter   [phone]    Current Level of Care: Hospital Recommended Level of Care: [place] Prior Approval Number:    Date Approved/Denied:   PASRR Number: [id number]  Discharge Plan: SNF    Current Diagnoses: Patient Active Problem List   Diagnosis Date Noted   Altered mental status 07/01/2021   Nephrostomy status ([place]) 07/01/2021   Multiple pulmonary nodules 16/38/4665   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[place]) 06/06/2021   Normocytic anemia 06/06/2021   DVT of left external iliac vein 04/2021 06/06/2021   Pulmonary emboli 04/2021 06/06/2021   Pressure injury of skin 04/22/2021   Popliteal artery occlusion, 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   PAD (peripheral artery disease) ([place]) 03/06/2021   Acute on chronic combined systolic and diastolic CHF (congestive heart failure) ([place]) 09/06/2020   CHF (congestive heart failure) ([place]) 08/26/2020   Acute combined systolic and diastolic CHF, NYHA class 3 (HCC)    Stage 3 chronic kidney disease ([place]) - baseline SCr 1.1    Onychomycosis of toenail 04/23/2020   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   PVC's (premature ventricular contractions) 06/12/2016   Junctional tachycardia ([place]) 09/16/2015   AKI (acute kidney injury) ([place]) 08/30/2015   SVT (supraventricular tachycardia)  long RP    Atherosclerosis of native arteries of the extremities with ulceration ([place]) 10/23/2012   Essential hypertension 09/29/2012   Type 2 diabetes mellitus ([place]) 09/28/2012    Status post mitral valve replacement with bioprosthetic valve 99/35/7017   Chronic systolic heart failure (HCC) - LVEF 25 to 30% 09/16/2012   Peripheral vascular disease ([place]) 09/15/2012   Sepsis with acute renal failure without septic shock ([place]) 09/15/2012   Acute ischemic stroke ([place]) 09/15/2012    Orientation RESPIRATION BLADDER Height & Weight     Self, Time, Situation, Place  Normal Incontinent Weight:   Height:     BEHAVIORAL SYMPTOMS/MOOD NEUROLOGICAL BOWEL NUTRITION STATUS      Incontinent Diet (Heart Healthy)  AMBULATORY STATUS COMMUNICATION OF NEEDS Skin   Extensive Assist (Bilateral BKA) Verbally Normal, Other (Comment) (Bilateral BKA)                       Personal Care Assistance Level of Assistance  Bathing, Feeding, Dressing   Feeding assistance: Limited assistance Dressing Assistance: Maximum assistance     Functional Limitations Info  Sight, Hearing, Speech Sight Info: Adequate Hearing Info: Adequate Speech Info: Adequate    SPECIAL CARE FACTORS FREQUENCY  PT (By licensed PT), OT (By licensed OT)     PT Frequency: Eval and Treat OT Frequency: Eval and Treat            Contractures Contractures Info: Not present    Additional Factors Info  Code Status, Allergies, Insulin Sliding Scale Code Status Info: FULL Allergies Info: Tuna (Fish Allergy)   Insulin Sliding Scale Info: See discharge summary for sliding scale       Current Medications (08/08/2021):  This is the current hospital active medication list Current Facility-Administered Medications  Medication Dose Route Frequency  Provider Last Rate Last Admin   acetaminophen (TYLENOL) tablet 650 mg  650 mg Oral Q6H PRN Lucrezia Starch, MD       amiodarone (PACERONE) tablet 200 mg  200 mg Oral Daily Lucrezia Starch, MD   200 mg at 08/08/21 1003   apixaban (ELIQUIS) tablet 5 mg  5 mg Oral Q12H Lucrezia Starch, MD   5 mg at 08/08/21 1003   atorvastatin (LIPITOR) tablet 40 mg  40 mg  Oral QHS Lucrezia Starch, MD   40 mg at 08/07/21 2152   bisacodyl (DULCOLAX) EC tablet 5 mg  5 mg Oral Daily PRN Lucrezia Starch, MD       gabapentin (NEURONTIN) capsule 300 mg  300 mg Oral QHS Amin, Ankit Chirag, MD   300 mg at 08/07/21 2152   guaiFENesin (ROBITUSSIN) 100 MG/5ML liquid 5 mL  5 mL Oral Q4H PRN Amin, Ankit Chirag, MD   5 mL at 08/08/21 1349   hydrALAZINE (APRESOLINE) injection 10 mg  10 mg Intravenous Q4H PRN Amin, Ankit Chirag, MD       insulin aspart (novoLOG) injection 0-9 Units  0-9 Units Subcutaneous TID WC Lucrezia Starch, MD       ipratropium-albuterol (DUONEB) 0.5-2.5 (3) MG/3ML nebulizer solution 3 mL  3 mL Nebulization Q4H PRN Amin, Jeanella Flattery, MD       lactated ringers infusion   Intravenous Continuous Etta Quill, DO 75 mL/hr at 08/08/21 1136 New Bag at 08/08/21 1136   methocarbamol (ROBAXIN) tablet 500 mg  500 mg Oral Q12H Amin, Ankit Chirag, MD   500 mg at 08/08/21 1003   metoprolol tartrate (LOPRESSOR) injection 5 mg  5 mg Intravenous Q4H PRN Amin, Ankit Chirag, MD       midodrine (PROAMATINE) tablet 10 mg  10 mg Oral TID WC Lucrezia Starch, MD   10 mg at 08/08/21 1137   ondansetron (ZOFRAN) tablet 4 mg  4 mg Oral Q6H PRN Etta Quill, DO   4 mg at 08/07/21 2029   Or   ondansetron (ZOFRAN) injection 4 mg  4 mg Intravenous Q6H PRN Etta Quill, DO       oxyCODONE (Oxy IR/ROXICODONE) immediate release tablet 5 mg  5 mg Oral Q4H PRN Amin, Ankit Chirag, MD   5 mg at 08/08/21 1137   senna-docusate (Senokot-S) tablet 1 tablet  1 tablet Oral QHS PRN Damita Lack, MD         Discharge Medications: Please see discharge summary for a list of discharge medications.  Relevant Imaging Results:  Relevant Lab Results:   Additional Information [phone]  Purcell Mouton, RN

## 2021-08-08 NOTE — Assessment & Plan Note (Addendum)
At admission thought related to nephrostomy tube vs dehydration? Lasix was held on admission Worse today, renal US without hydro Give lack of improvement in renal function in the setting of nephrostomy tube dysfunction, discussed with urology, recommended CT abdomen/pelvis -> CT shows L nephrostomy tube without hydro and improving hematoma in L retroperitoneum.  CT notes anasarca with effusion. Volume overload likely cause of AKI Needs IV diuresis, will give lasix + albumin and follow

## 2021-08-08 NOTE — Assessment & Plan Note (Signed)
statin

## 2021-08-08 NOTE — Assessment & Plan Note (Signed)
Eliquis, amio

## 2021-08-08 NOTE — Assessment & Plan Note (Signed)
midodrine

## 2021-08-08 NOTE — Assessment & Plan Note (Signed)
Continue eliquis Hx DVT and PE

## 2021-08-08 NOTE — Progress Notes (Signed)
PROGRESS NOTE    Ryan [name]  XAJ:[id number] DOB: [birth year] DOA: 07/21/2021 PCP: [name], MD   Chief Complaint  Patient presents with   Nephrostomy tube dislodged    Brief Narrative:  Ryan [name] is Ryan [name] 75 y.o. male with medical history significant of HFrEF (25%), HTN, DM2, PVD s/p B AKAs.  DVT and PE in Aug, [place].Fib, pt on eliquis.   Pt has L nephrostomy tube due to L hydronephrosis from large L retroperitoneal hematoma 04/20/21.  Planned for at least 2 months of nephrostomy tube.   Pt presents to ED today because nephrostomy tube broke.  This occurred during transfer.   No other medical complaints.  No fever, chills, abd pain.   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 Holding lasix Gradually improving, follow  Malfunction of nephrostomy tube ([place]) Dislodged prior to admission Now s/p exchange of L perc nephrostomy tube - L renal collecting system widely patent, nephrostomy tube capped.   Consider removal in near future. He has follow up with IR arranged  Hematuria after procedure, follow, this should resolve  Hematuria After procedure Follow for resolution  Chronic systolic heart failure (HCC) - LVEF 25 to 30% Appears euvolemic, holding lasix for now  Chronic hypotension midodrine  History of venous thromboembolism Continue eliquis Hx DVT and PE  Dyslipidemia statin  Type 2 diabetes mellitus (HCC) SSI  Pressure  ulcer Stage II Frequent turns, wound care per nursing  AF (paroxysmal atrial fibrillation) (HCC) Eliquis, amio     DVT prophylaxis: eliquis Code Status: full  Family Communication: sister Disposition:   Status is: Observation  The patient will require care spanning > 2 midnights and should be moved to inpatient because: pending discharge      Consultants:  IR  Procedures:  Exchange of L perc nephrostomy tube 11/18  Antimicrobials:  Anti-infectives (From admission, onward)    None          Subjective: No complaints  Objective: Vitals:   08/07/21 1837 08/07/21 2100 08/08/21 0302 08/08/21 1350  BP: 106/71 106/64 115/72 114/73  Pulse: 65 65 64 66  Resp:  18 18 18   Temp: (!) 97.5 F (36.4 C)  97.7 F (36.5 C) 97.7 F (36.5 C)  TempSrc: Oral  Oral Oral  SpO2: 100% 91% 99% 97%    Intake/Output Summary (Last 24 hours) at 08/08/2021 1905 Last data filed at 08/08/2021 1837 Gross per 24 hour  Intake 240 ml  Output 600 ml  Net -360 ml   There were no vitals filed for this visit.  Examination:  General exam: Appears calm and comfortable  Respiratory system: unlabored Cardiovascular system: RRR Gastrointestinal system: Abdomen is nondistended, soft and nontender. L nephrostomy tube in place Central nervous system: Alert and oriented. No focal neurological deficits. Extremities: bilateral BKA, left with staples Skin: No rashes, lesions or ulcers Psychiatry: Judgement and insight appear normal. Mood & affect appropriate.  Data Reviewed: I have personally reviewed following labs and imaging studies  CBC: Recent Labs  Lab 08/10/2021 2206 08/07/21 0658 08/08/21 0508  WBC 5.7 6.9 6.6  NEUTROABS 5.2  --   --   HGB 8.6* 8.4* 8.8*  HCT 28.0* 27.3* 28.3*  MCV 73.7* 73.2* 72.6*  PLT 197 191 093    Basic Metabolic Panel: Recent Labs  Lab 07/30/2021 2206 08/07/21 0658 08/08/21 0508  NA 138 138 139  K 4.6 4.4 4.5  CL 108 110 112*  CO2 18* 19*  18*  GLUCOSE 140* 94 66*  BUN 46* 49* 48*  CREATININE 1.69* 1.52* 1.42*  CALCIUM 8.7* 8.6* 8.4*  MG  --   --  2.4    GFR: CrCl cannot be calculated (Unknown ideal weight.).  Liver Function Tests: No results for input(s): AST, ALT, ALKPHOS, BILITOT, PROT, ALBUMIN in the last 168 hours.  CBG: Recent Labs  Lab 08/07/21 1714 08/07/21 2253 08/08/21 0716 08/08/21 1106 08/08/21 1634  GLUCAP 90 90 74 89 90     Recent Results (from the past 240 hour(s))  Resp Panel by RT-PCR (Flu Bryn Saline&B, Covid) Nasopharyngeal Swab     Status: None   Collection Time: 07/21/2021 10:14 PM   Specimen: Nasopharyngeal Swab; Nasopharyngeal(NP) swabs in vial transport medium  Result Value Ref Range Status   SARS Coronavirus 2 by RT PCR NEGATIVE NEGATIVE Final    Comment: (NOTE) SARS-CoV-2 target nucleic acids are NOT DETECTED.  The SARS-CoV-2 RNA is generally detectable in upper respiratory specimens during the acute phase of infection. The lowest concentration of SARS-CoV-2 viral copies this assay can detect is 138 copies/mL. Barkley Kratochvil negative result does not preclude SARS-Cov-2 infection and should not be used as the sole basis for treatment or other patient management decisions. Hever Castilleja negative result may occur with  improper specimen collection/handling, submission of specimen other than nasopharyngeal swab, presence of viral mutation(s) within the areas targeted by this assay, and inadequate number of viral copies(<138 copies/mL). Derian Dimalanta negative result must be combined with clinical observations, patient history, and epidemiological information. The expected result is Negative.  Fact Sheet for Patients:  EntrepreneurPulse.com.au  Fact Sheet for Healthcare Providers:  IncredibleEmployment.be  This test is no t yet approved or cleared by the [place] FDA and  has been authorized for detection and/or diagnosis of SARS-CoV-2 by FDA under an Emergency Use Authorization (EUA).  This EUA will remain  in effect (meaning this test can be used) for the duration of the COVID-19 declaration under Section 564(b)(1) of the Act, 21 U.S.C.section 360bbb-3(b)(1), unless the authorization is terminated  or revoked sooner.       Influenza Kelicia Youtz by PCR NEGATIVE NEGATIVE Final   Influenza B by PCR NEGATIVE NEGATIVE Final    Comment: (NOTE) The Xpert Xpress SARS-CoV-2/FLU/RSV plus assay is intended as an aid in the diagnosis of influenza from Nasopharyngeal swab specimens and should not be used as Ramzey Petrovic sole basis for treatment. Nasal washings and aspirates are unacceptable for Xpert Xpress SARS-CoV-2/FLU/RSV testing.  Fact Sheet for Patients: EntrepreneurPulse.com.au  Fact Sheet for Healthcare Providers: IncredibleEmployment.be  This test is not yet approved or cleared by the [place] FDA and has been authorized for detection and/or diagnosis of SARS-CoV-2 by FDA under an Emergency Use Authorization (EUA). This EUA will remain in effect (meaning this test can be used) for the duration of the COVID-19 declaration under Section 564(b)(1) of the Act, 21 U.S.C. section 360bbb-3(b)(1), unless the authorization is terminated or revoked.  Performed at  [facility], [place] [address], [place], [place] [address]   MRSA Next Gen by PCR, Nasal     Status: Abnormal   Collection Time: 08/08/21  3:06 PM   Specimen: Nasal Mucosa; Nasal Swab  Result Value Ref Range Status   MRSA by PCR Next Gen DETECTED (Odis Turck) NOT DETECTED Final    Comment: (NOTE) The GeneXpert MRSA Assay (FDA approved for NASAL specimens only), is one component of Hoa Briggs comprehensive MRSA colonization surveillance program. It is not intended to diagnose MRSA infection nor to guide or monitor treatment for MRSA infections. Test performance is not FDA approved in patients less than 61 years old. Performed at [facility], [place] [address] [place],  [address]          Radiology Studies: IR Nephrostomy Tube Change  Result Date: 08/07/2021 INDICATION: 75 year old with left percutaneous nephrostomy tube that was damaged and needs replacement. Elevated creatinine level. History of left ureter obstruction secondary to retroperitoneal hematoma. EXAM: LEFT NEPHROSTOMY TUBE EXCHANGE WITH FLUOROSCOPY MEDICATIONS: Local anesthetic, 1% lidocaine ANESTHESIA/SEDATION: None CONTRAST:  10 mL Omnipaque 300-administered into the collecting system(s) FLUOROSCOPY TIME:  Fluoroscopy Time: 1 minute, 30 seconds, 10 mGy COMPLICATIONS: None immediate. PROCEDURE: Patient was placed prone. The left flank was prepped and draped in sterile fashion. Maximal barrier sterile technique was utilized including caps, mask, sterile gowns, sterile gloves, sterile drape, hand hygiene and skin antiseptic. Nephrostomy was not present. Kumpe catheter was easily advanced through the nephrostomy tube hole into the renal pelvis using fluoroscopy. Contrast injection confirmed placement in the renal collecting system. Left nephrostogram was performed. Catheter was removed over Mulan Adan superstiff Amplatz wire and Tieler Cournoyer new 10 [place] multipurpose drain was reconstituted in the renal pelvis. Skin was anesthetized using 1% lidocaine. Catheter was sutured to skin. Catheter was flushed and capped. FINDINGS: New catheter was easily advanced into the left renal collecting system. Contrast injection demonstrated that the left ureter is widely patent with contrast draining into the urinary bladder. IMPRESSION: 1. Successful exchange of the left percutaneous nephrostomy tube. 2. Left renal collecting system is widely patent. Contrast rapidly drains through the left ureter into the bladder. Due to the patent left ureter, the nephrostomy tube was capped. The left nephrostomy tube may be able to be removed in the near future depending on the cause of the patient's elevated creatinine. Electronically  Signed   By: Markus Daft M.D.   On: 08/07/2021 11:48        Scheduled Meds:  amiodarone  200 mg Oral Daily   apixaban  5 mg Oral Q12H   atorvastatin  40 mg Oral QHS   gabapentin  300 mg Oral QHS   insulin aspart  0-9 Units Subcutaneous TID WC   methocarbamol  500 mg Oral Q12H   midodrine  10 mg Oral TID WC   Continuous Infusions:  lactated ringers 75 mL/hr at 08/08/21 1136     LOS: 0 days    Time spent: over 30 min    Fayrene Helper, MD [facility]   To contact the attending provider between 7A-7P or the covering provider during after hours 7P-7A, please log into the web site [email or web address] and access using universal [place] password for that web site. If you do not have the password, please call the hospital operator.  08/08/2021, 7:05 PM

## 2021-08-08 NOTE — Assessment & Plan Note (Signed)
Stage II Frequent turns, wound care per nursing

## 2021-08-08 NOTE — Assessment & Plan Note (Addendum)
Dislodged prior to admission Now s/p exchange of L perc nephrostomy tube - L renal collecting system widely patent, nephrostomy tube capped.   Consider removal in near future. He has follow up with IR arranged Hematuria after procedure, follow, this should resolve - appears to be improving

## 2021-08-08 NOTE — Assessment & Plan Note (Addendum)
After procedure Follow for resolution - improving

## 2021-08-08 NOTE — Assessment & Plan Note (Signed)
SSI

## 2021-08-08 NOTE — Assessment & Plan Note (Addendum)
CT scan with anasarca IV lasix with albumin

## 2021-08-08 NOTE — TOC Progression Note (Signed)
Transition of Care [facility]) - Progression Note    Patient Details  Name: Ryan [name] MRN: [id number] Date of Birth: 1945-11-15  Transition of [facility] CM/SW Contact  Purcell Mouton, RN Phone Number: 08/08/2021, 3:04 PM  Clinical Narrative:    Pt is from [facility]. A call to [facility] revealed that pt can't come back until Monday and will need insurance auth.    Expected Discharge Plan: [place] Barriers to Discharge: No Barriers Identified  Expected Discharge Plan and Services Expected Discharge Plan: [place]   Discharge Planning Services: CM Consult   Living arrangements for the past 2 months: [place]                                       Social Determinants of Health (SDOH) Interventions    Readmission Risk Interventions Readmission Risk Prevention Plan 07/09/2021 04/15/2021  Transportation Screening Complete -  PCP or Specialist Appt within 5-7 Days Complete -  Home Care Screening Complete Complete  Medication Review (RN CM) Complete Complete  Some recent data might be hidden

## 2021-08-09 ENCOUNTER — Observation Stay (HOSPITAL_COMMUNITY): Payer: Medicare HMO

## 2021-08-09 DIAGNOSIS — N179 Acute kidney failure, unspecified: Secondary | ICD-10-CM | POA: Diagnosis not present

## 2021-08-09 DIAGNOSIS — R188 Other ascites: Secondary | ICD-10-CM | POA: Diagnosis not present

## 2021-08-09 LAB — GLUCOSE, CAPILLARY
Glucose-Capillary: 69 mg/dL — ABNORMAL LOW (ref 70–99)
Glucose-Capillary: 131 mg/dL — ABNORMAL HIGH (ref 70–99)
Glucose-Capillary: 113 mg/dL — ABNORMAL HIGH (ref 70–99)
Glucose-Capillary: 88 mg/dL (ref 70–99)
Glucose-Capillary: 130 mg/dL — ABNORMAL HIGH (ref 70–99)

## 2021-08-09 LAB — CREATININE, URINE, RANDOM: Creatinine, Urine: 123.67 mg/dL

## 2021-08-09 LAB — CBC
HCT: 29.1 % — ABNORMAL LOW (ref 39.0–52.0)
Hemoglobin: 9 g/dL — ABNORMAL LOW (ref 13.0–17.0)
MCH: 22.3 pg — ABNORMAL LOW (ref 26.0–34.0)
MCHC: 30.9 g/dL (ref 30.0–36.0)
MCV: 72.2 fL — ABNORMAL LOW (ref 80.0–100.0)
Platelets: 208 10*3/uL (ref 150–400)
RBC: 4.03 MIL/uL — ABNORMAL LOW (ref 4.22–5.81)
RDW: 27.9 % — ABNORMAL HIGH (ref 11.5–15.5)
WBC: 8.6 10*3/uL (ref 4.0–10.5)
nRBC: 0.2 % (ref 0.0–0.2)

## 2021-08-09 LAB — BASIC METABOLIC PANEL
Anion gap: 9 (ref 5–15)
BUN: 47 mg/dL — ABNORMAL HIGH (ref 8–23)
CO2: 20 mmol/L — ABNORMAL LOW (ref 22–32)
Calcium: 8.5 mg/dL — ABNORMAL LOW (ref 8.9–10.3)
Chloride: 109 mmol/L (ref 98–111)
Creatinine, Ser: 1.67 mg/dL — ABNORMAL HIGH (ref 0.61–1.24)
GFR, Estimated: 42 mL/min — ABNORMAL LOW (ref >60)
Glucose, Bld: 80 mg/dL (ref 70–99)
Potassium: 4.6 mmol/L (ref 3.5–5.1)
Sodium: 138 mmol/L (ref 135–145)

## 2021-08-09 LAB — MAGNESIUM: Magnesium: 2.4 mg/dL (ref 1.7–2.4)

## 2021-08-09 LAB — SODIUM, URINE, RANDOM: Sodium, Ur: 15 mmol/L

## 2021-08-09 MED ORDER — LACTATED RINGERS IV SOLN: INTRAVENOUS | Status: AC

## 2021-08-09 NOTE — Progress Notes (Signed)
PROGRESS NOTE    Ryan [name]  GYK:[id number] DOB: 06-30-1946 DOA: 08/11/2021 PCP: [name], MD   Chief Complaint  Patient presents with   Nephrostomy tube dislodged    Brief Narrative:  DHILLON COMUNALE is Ryan [name] 75 y.o. male with medical history significant of HFrEF (25%), HTN, DM2, PVD s/p B AKAs.  DVT and PE in Aug, [name].Fib, pt on eliquis.   Pt has L nephrostomy tube due to L hydronephrosis from large L retroperitoneal hematoma 04/20/21.  Planned for at least 2 months of nephrostomy tube.   Pt presents to ED today because nephrostomy tube broke.  This occurred during transfer.   No other medical complaints.  No fever, chills, abd pain.   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 Holding lasix Worse today, renal US without hydro urine sodium <20, suspect volume down -> gentle IVF with HF  Malfunction of nephrostomy tube ([place]) Dislodged prior to admission Now s/p exchange of L perc nephrostomy tube - L renal collecting system widely patent, nephrostomy tube capped.   Consider removal in near future. He has follow up with IR arranged  Hematuria after procedure, follow, this should resolve  Hematuria After procedure Follow for resolution  Chronic systolic heart failure (HCC) - LVEF 25 to 30% Appears euvolemic, holding lasix for now Gentle IVF with his AKI, doesn't appear overloaded  Chronic hypotension midodrine  History of venous  thromboembolism Continue eliquis Hx DVT and PE  Dyslipidemia statin  Type 2 diabetes mellitus (HCC) SSI  Pressure ulcer Stage II Frequent turns, wound care per nursing  AF (paroxysmal atrial fibrillation) (HCC) Eliquis, amio     DVT prophylaxis: eliquis Code Status: full  Family Communication: discussed with [name] Disposition:   Status is: Observation  The patient will require care spanning > 2 midnights and should be moved to inpatient because: pending discharge      Consultants:  IR  Procedures:  Exchange of L perc nephrostomy tube 11/18  Antimicrobials:  Anti-infectives (From admission, onward)    None          Subjective: Denies complaitns  Objective: Vitals:   08/08/21 2106 08/09/21 0429 08/09/21 0827 08/09/21 1207  BP: 112/67 115/67  106/68  Pulse: 65 65 62 63  Resp: 16 18  20   Temp: 97.7 F (36.5 C) 97.9 F (36.6 C)  98.1 F (36.7 C)  TempSrc: Oral Oral  Oral  SpO2: 100% 98% 97% 100%    Intake/Output Summary (Last 24 hours) at 08/09/2021 1640 Last data filed at 08/09/2021 0900 Gross per 24 hour  Intake 360 ml  Output 250 ml  Net 110 ml   There were no vitals filed for this visit.  Examination:  General: No acute distress. Cardiovascular: RRR Lungs: unlabored Abdomen: Soft, nontender, nondistended  Neurological: Alert and oriented 3. Moves all extremities 4. Cranial nerves II through XII grossly intact. Skin: Warm and dry. No rashes or lesions. Extremities:  bilateral amputations     Data Reviewed: I have personally reviewed following labs and imaging studies  CBC: Recent Labs  Lab 08/09/2021 2206 08/07/21 0658 08/08/21 0508 08/09/21 0545  WBC 5.7 6.9 6.6 8.6  NEUTROABS 5.2  --   --   --   HGB 8.6* 8.4* 8.8* 9.0*  HCT 28.0* 27.3* 28.3* 29.1*  MCV 73.7* 73.2* 72.6* 72.2*  PLT 197 191 210 409    Basic Metabolic Panel: Recent Labs  Lab 08/19/2021 2206 08/07/21 0658 08/08/21 0508 08/09/21 0545  NA 138 138  139 138  K 4.6 4.4 4.5 4.6  CL 108 110 112* 109  CO2 18* 19* 18* 20*  GLUCOSE 140* 94 66* 80  BUN 46* 49* 48* 47*  CREATININE 1.69* 1.52* 1.42* 1.67*  CALCIUM 8.7* 8.6* 8.4* 8.5*  MG  --   --  2.4 2.4    GFR: CrCl cannot be calculated (Unknown ideal weight.).  Liver Function Tests: No results for input(s): AST, ALT, ALKPHOS, BILITOT, PROT, ALBUMIN in the last 168 hours.  CBG: Recent Labs  Lab 08/08/21 1634 08/08/21 2146 08/09/21 0740 08/09/21 0840 08/09/21 1204  GLUCAP 90 107* 69* 131* 113*     Recent Results (from the past 240 hour(s))  Resp Panel by RT-PCR (Flu Spurgeon Gancarz&B, Covid) Nasopharyngeal Swab     Status: None   Collection Time: 08/08/2021 10:14 PM   Specimen: Nasopharyngeal Swab; Nasopharyngeal(NP) swabs in vial transport medium  Result Value Ref Range Status   SARS Coronavirus 2 by RT PCR NEGATIVE NEGATIVE Final    Comment: (NOTE) SARS-CoV-2 target nucleic acids are NOT DETECTED.  The SARS-CoV-2 RNA is generally detectable in upper respiratory specimens during the acute phase of infection. The lowest concentration of SARS-CoV-2 viral copies this assay can detect is 138 copies/mL. Yarimar Lavis negative result does not preclude SARS-Cov-2 infection and should not be used as the sole basis for treatment or other patient management decisions. Jacyln Carmer negative result may occur with  improper specimen collection/handling, submission of specimen other than nasopharyngeal swab, presence of viral mutation(s) within the areas targeted by this assay, and inadequate number of viral copies(<138 copies/mL). Christee Mervine negative result must be combined with clinical observations, patient history, and epidemiological information. The expected result is Negative.  Fact Sheet for Patients:  EntrepreneurPulse.com.au  Fact Sheet for Healthcare Providers:  IncredibleEmployment.be  This test is no t yet approved or cleared by the [place] FDA and  has been authorized  for detection and/or diagnosis of SARS-CoV-2 by FDA under an Emergency Use Authorization (EUA). This EUA will remain  in effect (meaning this test can be used) for the duration of the COVID-19 declaration under Section 564(b)(1) of the Act, 21 U.S.C.section 360bbb-3(b)(1), unless the authorization is terminated  or revoked sooner.       Influenza Morgan Rennert by PCR NEGATIVE NEGATIVE Final   Influenza B by PCR NEGATIVE NEGATIVE Final    Comment: (NOTE) The Xpert Xpress SARS-CoV-2/FLU/RSV plus assay is intended as an aid in the diagnosis of influenza from Nasopharyngeal swab specimens and should not be used as Kaulana Brindle sole basis for treatment. Nasal washings and aspirates are unacceptable for Xpert Xpress SARS-CoV-2/FLU/RSV testing.  Fact Sheet for Patients: EntrepreneurPulse.com.au  Fact Sheet for Healthcare Providers: IncredibleEmployment.be  This test is not yet approved or cleared by the [place] FDA and has been authorized for detection and/or diagnosis of SARS-CoV-2 by FDA under an Emergency Use Authorization (EUA). This EUA will remain in effect (meaning this test can be used)  for the duration of the COVID-19 declaration under Section 564(b)(1) of the Act, 21 U.S.C. section 360bbb-3(b)(1), unless the authorization is terminated or revoked.  Performed at [facility], [place] [address] [place], [place] [address]   MRSA Next Gen by PCR, Nasal     Status: Abnormal   Collection Time: 08/08/21  3:06 PM   Specimen: Nasal Mucosa; Nasal Swab  Result Value Ref Range Status   MRSA by PCR Next Gen DETECTED (Josafat Enrico) NOT DETECTED Final    Comment: (NOTE) The GeneXpert MRSA Assay (FDA approved for NASAL specimens only), is one component of Malaysha Arlen comprehensive MRSA colonization surveillance program. It is not intended to diagnose MRSA infection nor to guide or monitor treatment for MRSA infections. Test performance is not FDA approved in patients  less than 15 years old. Performed at [facility], [place] [address] [place], [place] [address]          Radiology Studies: US RENAL  Result Date: 08/09/2021 CLINICAL DATA:  Acute kidney injury, history of left nephrostomy catheter related to hydronephrosis from Danuta Huseman left retroperitoneal hematoma. EXAM: RENAL / URINARY TRACT ULTRASOUND COMPLETE COMPARISON:  08/07/2021, 07/02/2021 FINDINGS: Right Kidney: Renal measurements: 10.2 x 3.9 x 4.9 cm = volume: 102 mL. No mass or hydronephrosis. Slight increased cortical echogenicity compatible with medical renal disease. No acute finding. Left Kidney: Renal measurements: 10.4 x 5.0 x 4.8 cm = volume: 132 mL. Limited visualization of the left kidney related to patient breathing, positioning, obscuring bowel gas. No significant left hydronephrosis. Bladder: Not well distended. Ureteral jets demonstrated. Urinary sediment or debris noted dependently within the bladder. Other: Small amount of abdominopelvic ascites noted. IMPRESSION: Negative for hydronephrosis. Limited assessment of the left kidney as above. Small amount of abdominopelvic ascites Electronically Signed   By: Jerilynn Mages.  Shick M.D.   On: 08/09/2021 08:39        Scheduled Meds:  amiodarone  200 mg Oral Daily   apixaban  5 mg Oral Q12H   atorvastatin  40 mg Oral QHS   gabapentin  300 mg Oral QHS   insulin aspart  0-9 Units Subcutaneous TID WC   methocarbamol  500 mg Oral Q12H   midodrine  10 mg Oral TID WC   Continuous Infusions:  lactated ringers       LOS: 0 days    Time spent: over 30 min    Fayrene Helper, MD [facility]   To contact the attending provider between 7A-7P or the covering provider during after hours 7P-7A, please log into the web site [email or web address] and access using universal [place] password for that web site. If you do not have the password, please call the hospital operator.  08/09/2021, 4:40 PM

## 2021-08-10 ENCOUNTER — Telehealth: Payer: Self-pay

## 2021-08-10 DIAGNOSIS — R7989 Other specified abnormal findings of blood chemistry: Secondary | ICD-10-CM

## 2021-08-10 DIAGNOSIS — N179 Acute kidney failure, unspecified: Secondary | ICD-10-CM | POA: Diagnosis not present

## 2021-08-10 DIAGNOSIS — Z89611 Acquired absence of right leg above knee: Secondary | ICD-10-CM

## 2021-08-10 LAB — COMPREHENSIVE METABOLIC PANEL
ALT: 55 U/L — ABNORMAL HIGH (ref 0–44)
AST: 95 U/L — ABNORMAL HIGH (ref 15–41)
Albumin: 2.1 g/dL — ABNORMAL LOW (ref 3.5–5.0)
Alkaline Phosphatase: 136 U/L — ABNORMAL HIGH (ref 38–126)
Anion gap: 9 (ref 5–15)
BUN: 41 mg/dL — ABNORMAL HIGH (ref 8–23)
CO2: 18 mmol/L — ABNORMAL LOW (ref 22–32)
Calcium: 8.2 mg/dL — ABNORMAL LOW (ref 8.9–10.3)
Chloride: 109 mmol/L (ref 98–111)
Creatinine, Ser: 1.51 mg/dL — ABNORMAL HIGH (ref 0.61–1.24)
GFR, Estimated: 47 mL/min — ABNORMAL LOW (ref >60)
Glucose, Bld: 83 mg/dL (ref 70–99)
Potassium: 4.8 mmol/L (ref 3.5–5.1)
Sodium: 136 mmol/L (ref 135–145)
Total Bilirubin: 1 mg/dL (ref 0.3–1.2)
Total Protein: 6 g/dL — ABNORMAL LOW (ref 6.5–8.1)

## 2021-08-10 LAB — MAGNESIUM: Magnesium: 2.2 mg/dL (ref 1.7–2.4)

## 2021-08-10 LAB — CBC WITH DIFFERENTIAL/PLATELET
Abs Immature Granulocytes: 0.04 10*3/uL (ref 0.00–0.07)
Basophils Absolute: 0 10*3/uL (ref 0.0–0.1)
Basophils Relative: 0 %
Eosinophils Absolute: 0.1 10*3/uL (ref 0.0–0.5)
Eosinophils Relative: 2 %
HCT: 29.1 % — ABNORMAL LOW (ref 39.0–52.0)
Hemoglobin: 9 g/dL — ABNORMAL LOW (ref 13.0–17.0)
Immature Granulocytes: 1 %
Lymphocytes Relative: 14 %
Lymphs Abs: 1.2 10*3/uL (ref 0.7–4.0)
MCH: 22.8 pg — ABNORMAL LOW (ref 26.0–34.0)
MCHC: 30.9 g/dL (ref 30.0–36.0)
MCV: 73.7 fL — ABNORMAL LOW (ref 80.0–100.0)
Monocytes Absolute: 1.1 10*3/uL — ABNORMAL HIGH (ref 0.1–1.0)
Monocytes Relative: 13 %
Neutro Abs: 6 10*3/uL (ref 1.7–7.7)
Neutrophils Relative %: 70 %
Platelets: 207 10*3/uL (ref 150–400)
RBC: 3.95 MIL/uL — ABNORMAL LOW (ref 4.22–5.81)
RDW: 27.9 % — ABNORMAL HIGH (ref 11.5–15.5)
WBC: 8.4 10*3/uL (ref 4.0–10.5)
nRBC: 0.4 % — ABNORMAL HIGH (ref 0.0–0.2)

## 2021-08-10 LAB — GLUCOSE, CAPILLARY
Glucose-Capillary: 74 mg/dL (ref 70–99)
Glucose-Capillary: 126 mg/dL — ABNORMAL HIGH (ref 70–99)
Glucose-Capillary: 82 mg/dL (ref 70–99)
Glucose-Capillary: 88 mg/dL (ref 70–99)

## 2021-08-10 LAB — PHOSPHORUS: Phosphorus: 3 mg/dL (ref 2.5–4.6)

## 2021-08-10 MED ORDER — CHLORHEXIDINE GLUCONATE CLOTH 2 % EX PADS
6.0000 | MEDICATED_PAD | Freq: Every day | CUTANEOUS | Status: AC
  Administered 2021-08-11 – 2021-08-15 (×4): 6 via TOPICAL

## 2021-08-10 MED ORDER — CEFADROXIL 500 MG PO CAPS
500.0000 mg | ORAL_CAPSULE | Freq: Two times a day (BID) | ORAL | Status: DC
Start: 2021-08-10 — End: 2021-08-15
  Administered 2021-08-10 – 2021-08-15 (×11): 500 mg via ORAL
  Filled 2021-08-10 (×11): qty 1

## 2021-08-10 MED ORDER — MUPIROCIN 2 % EX OINT
1.0000 "application " | TOPICAL_OINTMENT | Freq: Two times a day (BID) | CUTANEOUS | Status: AC
  Administered 2021-08-10 – 2021-08-15 (×10): 1 via NASAL
  Filled 2021-08-10 (×2): qty 22

## 2021-08-10 NOTE — Progress Notes (Signed)
    Called to patient's bedside to remove staples as patient is possibly leaving here directly back to either SNF or [place] to be with family and will have difficulty maintaining his follow-up visit.  Staples were removed without incident.  There was some staple irritation medially he has been placed on antibiotics for this I do not see any underlying infection.  His plan is to follow-up with [place] on an as-needed basis.  Ryan [name] C. Donzetta Matters, MD

## 2021-08-10 NOTE — Telephone Encounter (Signed)
Received call from patient's relative asking about staple removal. He has been unable to make VVS office visit to have them removed due to hospitalizations. Upon this discharge, he is supposed to immediately go to [facility] for further care. He is currently at [facility]. Called [facility] to ask about staple removal during this admission. RN wanted to clarify with provider. Received call from Dr. Florene Glen asking for vascular evaluation prior to removing staples. Says area is "red." Asked him to contact provider on call to discuss patient's amp site and he declined. States, "I think he's fine." However, would still like vascular to see prior to staple removal. MD on call will go to [facility] today to evaluate patient's stump and remove staples if indicated.

## 2021-08-10 NOTE — Assessment & Plan Note (Addendum)
S/p L AKA on 07/01/21 Some erythema and mild discomfort to medial portion of wound.  Will start duricef.  No drainage or fluctuance appreciated. Staples removed by vascular on 11/21.  Will d/c abx as vascular suspects erythema due to staple irritation.

## 2021-08-10 NOTE — Progress Notes (Signed)
PROGRESS NOTE    Ryan [name]  XBJ:[id number] DOB: 09-01-1946 DOA: 08/02/2021 PCP: [name], MD   Chief Complaint  Patient presents with   Nephrostomy tube dislodged    Brief Narrative:  Ryan [name] is Ryan [name] 75 y.o. male with medical history significant of HFrEF (25%), HTN, DM2, PVD s/p B AKAs.  DVT and PE in Aug, [place].Fib, pt on eliquis.   Pt has L nephrostomy tube due to L hydronephrosis from large L retroperitoneal hematoma 04/20/21.  Planned for at least 2 months of nephrostomy tube.   Pt presents to ED today because nephrostomy tube broke.  This occurred during transfer.   No other medical complaints.  No fever, chills, abd pain.   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 Holding lasix Worse today, renal US without hydro urine sodium <20, suspect volume down -> gentle IVF with HF -> continue, his renal function had improved, but now worse today, follow  Malfunction of nephrostomy tube ([place]) Dislodged prior to admission Now s/p exchange of L perc nephrostomy tube - L renal collecting system widely patent, nephrostomy tube capped.   Consider removal in near future. He has follow up with IR arranged  Hematuria after procedure, follow, this should resolve - appears to be improving  S/P AKA (above knee amputation) bilateral (HCC) S/p L AKA on 07/01/21 Some erythema and mild  discomfort to medial portion of wound.  Will start duricef.  No drainage or fluctuance appreciated. Vascular office called to request we remove staples.  I requested vascular see him to evaluate wound for staple removal.    Hematuria After procedure Follow for resolution - improving  Chronic systolic heart failure (HCC) - LVEF 25 to 30% Appears euvolemic, holding lasix for now Gentle IVF with his AKI, doesn't appear overloaded  Chronic hypotension midodrine  History of venous thromboembolism Continue eliquis Hx DVT and PE  Dyslipidemia statin  Type 2 diabetes mellitus (HCC) SSI  Elevated LFTs Will monitor, continue lipitor, amiodarone for now  Pressure ulcer Stage II Frequent turns, wound care per nursing  AF (paroxysmal atrial fibrillation) (HCC) Eliquis, amio     DVT prophylaxis: eliquis Code Status: full  Family Communication: discussed with sister over phone Disposition:   Status is: Observation  The patient will require care spanning > 2 midnights and should be moved to inpatient because: pending discharge      Consultants:  IR  Procedures:  Exchange of L perc nephrostomy tube 11/18  Antimicrobials:  Anti-infectives (From admission, onward)    Start     Dose/Rate Route Frequency Ordered Stop   08/10/21 1345  cefadroxil (DURICEF) capsule 500 mg        500 mg Oral 2 times daily 08/10/21 1250            Subjective: Denies complaints  Objective: Vitals:   08/09/21 1207  08/09/21 2035 08/10/21 0504 08/10/21 1342  BP: 106/68 108/68 115/66 113/63  Pulse: 63 66 64 64  Resp: 20 16 16 16   Temp: 98.1 F (36.7 C) 98.1 F (36.7 C) 97.7 F (36.5 C) 98.7 F (37.1 C)  TempSrc: Oral Oral Oral   SpO2: 100% 96% 91% 98%    Intake/Output Summary (Last 24 hours) at 08/10/2021 1603 Last data filed at 08/10/2021 0946 Gross per 24 hour  Intake 1802.33 ml  Output 100 ml  Net 1702.33 ml   There were no vitals filed for this  visit.  Examination:  General: No acute distress. Cardiovascular: RRR Lungs: unlabored Abdomen: Soft, nontender, nondistended  Neurological: Alert and oriented 3. Moves all extremities 4. Cranial nerves II through XII grossly intact. Extremities: staples to L AKA, mild erythema and TTP       Data Reviewed: I have personally reviewed following labs and imaging studies  CBC: Recent Labs  Lab 08/07/2021 2206 08/07/21 0658 08/08/21 0508 08/09/21 0545 08/10/21 0513  WBC 5.7 6.9 6.6 8.6 8.4  NEUTROABS 5.2  --   --   --  6.0  HGB 8.6* 8.4* 8.8* 9.0* 9.0*  HCT 28.0* 27.3* 28.3* 29.1* 29.1*  MCV 73.7* 73.2* 72.6* 72.2* 73.7*  PLT 197 191 210 208 443    Basic Metabolic Panel: Recent Labs  Lab 08/12/2021 2206 08/07/21 0658 08/08/21 0508 08/09/21 0545 08/10/21 0513  NA 138 138 139 138 136  K 4.6 4.4 4.5 4.6 4.8  CL 108 110 112* 109 109  CO2 18* 19* 18* 20* 18*  GLUCOSE 140* 94 66* 80 83  BUN 46* 49* 48* 47* 41*  CREATININE 1.69* 1.52* 1.42* 1.67* 1.51*  CALCIUM 8.7* 8.6* 8.4* 8.5* 8.2*  MG  --   --  2.4 2.4 2.2  PHOS  --   --   --   --  3.0    GFR: CrCl cannot be calculated (Unknown ideal weight.).  Liver Function Tests: Recent Labs  Lab 08/10/21 0513  AST 95*  ALT 55*  ALKPHOS 136*  BILITOT 1.0  PROT 6.0*  ALBUMIN 2.1*    CBG: Recent Labs  Lab 08/09/21 1204 08/09/21 1703 08/09/21 2036 08/10/21 0749 08/10/21 1202  GLUCAP 113* 88 130* 74 126*     Recent Results (from the past 240 hour(s))  Resp Panel by RT-PCR (Flu Alvita Fana&B, Covid) Nasopharyngeal Swab     Status: None   Collection Time: 08/19/2021 10:14 PM   Specimen: Nasopharyngeal Swab; Nasopharyngeal(NP) swabs in vial transport medium  Result Value Ref Range Status   SARS Coronavirus 2 by RT PCR NEGATIVE NEGATIVE Final    Comment: (NOTE) SARS-CoV-2 target nucleic acids are NOT DETECTED.  The SARS-CoV-2 RNA is generally detectable in upper respiratory specimens during the acute phase of infection.  The lowest concentration of SARS-CoV-2 viral copies this assay can detect is 138 copies/mL. Nason Conradt negative result does not preclude SARS-Cov-2 infection and should not be used as the sole basis for treatment or other patient management decisions. Emersyn Wyss negative result may occur with  improper specimen collection/handling, submission of specimen other than nasopharyngeal swab, presence of viral mutation(s) within the areas targeted by this assay, and inadequate number of viral copies(<138 copies/mL). Kambra Beachem negative result must be combined with clinical observations, patient history, and epidemiological information. The expected result is Negative.  Fact Sheet for Patients:  EntrepreneurPulse.com.au  Fact Sheet for Healthcare Providers:  IncredibleEmployment.be  This test is no t yet approved or cleared by the [place] FDA  and  has been authorized for detection and/or diagnosis of SARS-CoV-2 by FDA under an Emergency Use Authorization (EUA). This EUA will remain  in effect (meaning this test can be used) for the duration of the COVID-19 declaration under Section 564(b)(1) of the Act, 21 U.S.C.section 360bbb-3(b)(1), unless the authorization is terminated  or revoked sooner.       Influenza Channie Bostick by PCR NEGATIVE NEGATIVE Final   Influenza B by PCR NEGATIVE NEGATIVE Final    Comment: (NOTE) The Xpert Xpress SARS-CoV-2/FLU/RSV plus assay is intended as an aid in the diagnosis of influenza from Nasopharyngeal swab specimens and should not be used as Jillaine Waren sole basis for treatment. Nasal washings and aspirates are unacceptable for Xpert Xpress SARS-CoV-2/FLU/RSV testing.  Fact Sheet for Patients: EntrepreneurPulse.com.au  Fact Sheet for Healthcare Providers: IncredibleEmployment.be  This test is not yet approved or cleared by the [place] FDA and has been authorized for detection and/or diagnosis of SARS-CoV-2 by FDA  under an Emergency Use Authorization (EUA). This EUA will remain in effect (meaning this test can be used) for the duration of the COVID-19 declaration under Section 564(b)(1) of the Act, 21 U.S.C. section 360bbb-3(b)(1), unless the authorization is terminated or revoked.  Performed at [facility], [place] [address] [place], [place] [address]   MRSA Next Gen by PCR, Nasal     Status: Abnormal   Collection Time: 08/08/21  3:06 PM   Specimen: Nasal Mucosa; Nasal Swab  Result Value Ref Range Status   MRSA by PCR Next Gen DETECTED (Pama Roskos) NOT DETECTED Final    Comment: (NOTE) The GeneXpert MRSA Assay (FDA approved for NASAL specimens only), is one component of Kylen Schliep comprehensive MRSA colonization surveillance program. It is not intended to diagnose MRSA infection nor to guide or monitor treatment for MRSA infections. Test performance is not FDA approved in patients less than 34 years old. Performed at [facility], [place] [address], [place], [place] [address]          Radiology Studies: US RENAL  Result Date: 08/09/2021 CLINICAL DATA:  Acute kidney injury, history of left nephrostomy catheter related to hydronephrosis from Kinya Meine left retroperitoneal hematoma. EXAM: RENAL / URINARY TRACT ULTRASOUND COMPLETE COMPARISON:  08/07/2021, 07/02/2021 FINDINGS: Right Kidney: Renal measurements: 10.2 x 3.9 x 4.9 cm = volume: 102 mL. No mass or hydronephrosis. Slight increased cortical echogenicity compatible with medical renal disease. No acute finding. Left Kidney: Renal measurements: 10.4 x 5.0 x 4.8 cm = volume: 132 mL. Limited visualization of the left kidney related to patient breathing, positioning, obscuring bowel gas. No significant left hydronephrosis. Bladder: Not well distended. Ureteral jets demonstrated. Urinary sediment or debris noted dependently within the bladder. Other: Small amount of abdominopelvic ascites noted. IMPRESSION: Negative for hydronephrosis.  Limited assessment of the left kidney as above. Small amount of abdominopelvic ascites Electronically Signed   By: Jerilynn Mages.  Shick M.D.   On: 08/09/2021 08:39        Scheduled Meds:  amiodarone  200 mg Oral Daily   apixaban  5 mg Oral Q12H   atorvastatin  40 mg Oral QHS   cefadroxil  500 mg Oral BID   [START ON 08/11/2021] Chlorhexidine Gluconate Cloth  6 each Topical Q0600   gabapentin  300 mg Oral QHS   insulin aspart  0-9 Units Subcutaneous TID WC   methocarbamol  500 mg Oral Q12H   midodrine  10 mg Oral TID WC   mupirocin ointment  1 application Nasal BID  Continuous Infusions:  lactated ringers 100 mL/hr at 08/10/21 1235     LOS: 0 days    Time spent: over 30 min    Fayrene Helper, MD [facility]   To contact the attending provider between 7A-7P or the covering provider during after hours 7P-7A, please log into the web site [email or web address] and access using universal [place] password for that web site. If you do not have the password, please call the hospital operator.  08/10/2021, 4:03 PM

## 2021-08-10 NOTE — Assessment & Plan Note (Addendum)
Will monitor, continue lipitor, amiodarone for now CT with hyperdense liver, nonspecific (2/2 amiodarone vs iron deposition)?

## 2021-08-11 ENCOUNTER — Observation Stay (HOSPITAL_COMMUNITY): Payer: Medicare HMO

## 2021-08-11 DIAGNOSIS — K8020 Calculus of gallbladder without cholecystitis without obstruction: Secondary | ICD-10-CM | POA: Diagnosis not present

## 2021-08-11 DIAGNOSIS — N179 Acute kidney failure, unspecified: Secondary | ICD-10-CM | POA: Diagnosis not present

## 2021-08-11 DIAGNOSIS — S36892A Contusion of other intra-abdominal organs, initial encounter: Secondary | ICD-10-CM | POA: Diagnosis not present

## 2021-08-11 DIAGNOSIS — J90 Pleural effusion, not elsewhere classified: Secondary | ICD-10-CM

## 2021-08-11 DIAGNOSIS — S301XXA Contusion of abdominal wall, initial encounter: Secondary | ICD-10-CM | POA: Diagnosis not present

## 2021-08-11 LAB — GLUCOSE, CAPILLARY
Glucose-Capillary: 65 mg/dL — ABNORMAL LOW (ref 70–99)
Glucose-Capillary: 76 mg/dL (ref 70–99)
Glucose-Capillary: 91 mg/dL (ref 70–99)
Glucose-Capillary: 71 mg/dL (ref 70–99)
Glucose-Capillary: 105 mg/dL — ABNORMAL HIGH (ref 70–99)
Glucose-Capillary: 101 mg/dL — ABNORMAL HIGH (ref 70–99)

## 2021-08-11 LAB — CBC WITH DIFFERENTIAL/PLATELET
Abs Immature Granulocytes: 0.08 10*3/uL — ABNORMAL HIGH (ref 0.00–0.07)
Basophils Absolute: 0.1 10*3/uL (ref 0.0–0.1)
Basophils Relative: 1 %
Eosinophils Absolute: 0.2 10*3/uL (ref 0.0–0.5)
Eosinophils Relative: 3 %
HCT: 31.6 % — ABNORMAL LOW (ref 39.0–52.0)
Hemoglobin: 9.7 g/dL — ABNORMAL LOW (ref 13.0–17.0)
Immature Granulocytes: 1 %
Lymphocytes Relative: 13 %
Lymphs Abs: 1.1 10*3/uL (ref 0.7–4.0)
MCH: 22.3 pg — ABNORMAL LOW (ref 26.0–34.0)
MCHC: 30.7 g/dL (ref 30.0–36.0)
MCV: 72.6 fL — ABNORMAL LOW (ref 80.0–100.0)
Monocytes Absolute: 0.8 10*3/uL (ref 0.1–1.0)
Monocytes Relative: 10 %
Neutro Abs: 5.8 10*3/uL (ref 1.7–7.7)
Neutrophils Relative %: 72 %
Platelets: UNDETERMINED 10*3/uL (ref 150–400)
RBC: 4.35 MIL/uL (ref 4.22–5.81)
RDW: 27.9 % — ABNORMAL HIGH (ref 11.5–15.5)
WBC: 8.1 10*3/uL (ref 4.0–10.5)
nRBC: 0.2 % (ref 0.0–0.2)

## 2021-08-11 LAB — COMPREHENSIVE METABOLIC PANEL
ALT: 58 U/L — ABNORMAL HIGH (ref 0–44)
AST: 118 U/L — ABNORMAL HIGH (ref 15–41)
Albumin: 2 g/dL — ABNORMAL LOW (ref 3.5–5.0)
Alkaline Phosphatase: 124 U/L (ref 38–126)
Anion gap: 8 (ref 5–15)
BUN: 40 mg/dL — ABNORMAL HIGH (ref 8–23)
CO2: 20 mmol/L — ABNORMAL LOW (ref 22–32)
Calcium: 8 mg/dL — ABNORMAL LOW (ref 8.9–10.3)
Chloride: 109 mmol/L (ref 98–111)
Creatinine, Ser: 1.56 mg/dL — ABNORMAL HIGH (ref 0.61–1.24)
GFR, Estimated: 45 mL/min — ABNORMAL LOW (ref >60)
Glucose, Bld: 91 mg/dL (ref 70–99)
Potassium: 5.2 mmol/L — ABNORMAL HIGH (ref 3.5–5.1)
Sodium: 137 mmol/L (ref 135–145)
Total Bilirubin: 1.3 mg/dL — ABNORMAL HIGH (ref 0.3–1.2)
Total Protein: 5.8 g/dL — ABNORMAL LOW (ref 6.5–8.1)

## 2021-08-11 LAB — POTASSIUM: Potassium: 4.9 mmol/L (ref 3.5–5.1)

## 2021-08-11 LAB — UREA NITROGEN, URINE: Urea Nitrogen, Ur: 905 mg/dL

## 2021-08-11 MED ORDER — ALBUMIN HUMAN 25 % IV SOLN
25.0000 g | Freq: Once | INTRAVENOUS | Status: AC
  Administered 2021-08-11: 25 g via INTRAVENOUS
  Filled 2021-08-11: qty 100

## 2021-08-11 MED ORDER — FUROSEMIDE 10 MG/ML IJ SOLN
20.0000 mg | Freq: Every day | INTRAMUSCULAR | Status: DC
  Administered 2021-08-12 – 2021-08-13 (×2): 20 mg via INTRAVENOUS
  Filled 2021-08-11 (×3): qty 2

## 2021-08-11 MED ORDER — FUROSEMIDE 10 MG/ML IJ SOLN: 20.0000 mg | Freq: Once | INTRAMUSCULAR | Status: DC

## 2021-08-11 MED ORDER — FUROSEMIDE 20 MG PO TABS: 20.0000 mg | ORAL_TABLET | Freq: Every day | ORAL | Status: DC

## 2021-08-11 MED ORDER — FUROSEMIDE 10 MG/ML IJ SOLN
40.0000 mg | Freq: Once | INTRAMUSCULAR | Status: AC
  Administered 2021-08-11: 40 mg via INTRAVENOUS
  Filled 2021-08-11: qty 4

## 2021-08-11 NOTE — Evaluation (Signed)
Physical Therapy Evaluation Patient Details Name: Ryan [name] MRN: [id number] DOB: November 03, 1945 Today's Date: 08/11/2021  History of Present Illness  75 y.o. male admitted with AKI. PMH: HFrEF (25%), HTN, DM2, PVD s/p B AKAs.  DVT and PE in Aug, [place].Fib, pt on eliquis  Clinical Impression  Pt admitted with above diagnosis.  Pt requiring extensive assist for bed mobility today, weaker than on admission. Pt and dtr report he was doing sliding board transfers prior to this admission. Will continue PT in acute setting.  Pt currently with functional limitations due to the deficits listed below (see PT Problem List). Pt will benefit from skilled PT to increase their independence and safety with mobility to allow discharge to the venue listed below.          Recommendations for follow up therapy are one component of a multi-disciplinary discharge planning process, led by the attending physician.  Recommendations may be updated based on patient status, additional functional criteria and insurance authorization.  Follow Up Recommendations Skilled nursing-short term rehab (<3 hours/day)    Assistance Recommended at Discharge    Functional Status Assessment Patient has had a recent decline in their functional status and demonstrates the ability to make significant improvements in function in a reasonable and predictable amount of time.  Equipment Recommendations  None recommended by PT    Recommendations for Other Services       Precautions / Restrictions Precautions Precautions: Fall Restrictions Weight Bearing Restrictions: No      Mobility  Bed Mobility Overal bed mobility: Needs Assistance Bed Mobility: Rolling;Sidelying to Sit;Sit to Supine Rolling: Max assist;Mod assist;+2 for safety/equipment Sidelying to sit: Max assist;Total assist   Sit to supine: Max assist;+2 for physical assistance;+2 for safety/equipment   General bed mobility comments: pt able to assist with rolling,  reaches for rail but assist with hips and shoulders to complete turn, attempted sitting EOB to pt R and L--pt with extensive assist required to elevate trunk, 2 assist to return to supine.    Transfers                        Ambulation/Gait                  Stairs            Wheelchair Mobility    Modified Rankin (Stroke Patients Only)       Balance Overall balance assessment: Needs assistance Sitting-balance support: Feet supported           Standing balance comment: n/a bil AKAs, does not use prosthesis                             Pertinent Vitals/Pain Pain Assessment: No/denies pain    Home Living Family/patient expects to be discharged to:: Skilled nursing facility                   Additional Comments: Pt was living with his daughter prior to previous hospitalization (July-Sept 2022). Since d/c from hospital, pt has been at [facility] for therapy. per dtr he has been in and out of hospital since being in rehab.    Prior Function Prior Level of Function : Needs assist       Physical Assist : Mobility (physical);ADLs (physical)     Mobility Comments: assist with sliding board transfers (per dtr) at rehab       [place]  Extremity/Trunk Assessment   Upper Extremity Assessment Upper Extremity Assessment: Generalized weakness    Lower Extremity Assessment Lower Extremity Assessment: Generalized weakness (bil AKAs, hip flexion and extension 2+/5)       Communication   Communication: No difficulties  Cognition Arousal/Alertness: Awake/alert (sleepy but awake) Behavior During Therapy: WFL for tasks assessed/performed Overall Cognitive Status: Within Functional Limits for tasks assessed                                          General Comments      Exercises     Assessment/Plan    PT Assessment Patient needs continued PT services  PT Problem List Decreased strength;Decreased  activity tolerance;Decreased mobility;Decreased knowledge of use of DME;Decreased balance       PT Treatment Interventions Therapeutic activities;Therapeutic exercise;Functional mobility training;Patient/family education;Balance training    PT Goals (Current goals can be found in the Care Plan section)  Acute Rehab PT Goals Patient Stated Goal: back to rehab PT Goal Formulation: With patient Time For Goal Achievement: 08/25/21 Potential to Achieve Goals: Good    Frequency Min 2X/week   Barriers to discharge        Co-evaluation               AM-PAC PT "6 Clicks" Mobility  Outcome Measure Help needed turning from your back to your side while in a flat bed without using bedrails?: Total Help needed moving from lying on your back to sitting on the side of a flat bed without using bedrails?: Total Help needed moving to and from a bed to a chair (including a wheelchair)?: Total Help needed standing up from a chair using your arms (e.g., wheelchair or bedside chair)?: Total Help needed to walk in hospital room?: Total Help needed climbing 3-5 steps with a railing? : Total 6 Click Score: 6    End of Session Equipment Utilized During Treatment: Gait belt Activity Tolerance: Patient tolerated treatment well Patient left: with call bell/phone within reach;in bed;with nursing/sitter in room   PT Visit Diagnosis: Other abnormalities of gait and mobility (R26.89);Muscle weakness (generalized) (M62.81)    Time: 6811-5726 PT Time Calculation (min) (ACUTE ONLY): 21 min   Charges:   PT Evaluation $PT Eval Low Complexity: [place], PT  Acute Rehab Dept ([facility]) [phone] Pager [phone]  08/11/2021   [facility] 08/11/2021, 1:20 PM

## 2021-08-11 NOTE — Progress Notes (Signed)
PROGRESS NOTE    Ryan [name]  MGQ:[id number] DOB: [birth year] DOA: 07/22/2021 PCP: [name], MD   Chief Complaint  Patient presents with   Nephrostomy tube dislodged    Brief Narrative:  [name] is Ryan [name] 75 y.o. male with medical history significant of HFrEF (25%), HTN, DM2, PVD s/p B AKAs, DVT and PE in Aug, Ryan [name].Fib, pt on eliquis who presented in the setting of nephrostomy tube malfunction/dislodged in during transfer.  Nephrostomy tube was exchanged on  11/18.  He's continued to have AKI of unclear etiology despite nephrostomy tube exchange.  At this point, based on repeat CT scan, appears this is likely in the setting of volume overload with anasarca on imaging.  Currently receiving IV diuresis.  Discharge pending improvement in volume status and insurance authorization for return to SNF.  Family planning for him to go to [place] to live with son and daughter in law soon.   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 Lasix was held on admission Worse today, renal US without hydro Give lack of improvement in renal function in the setting of nephrostomy tube dysfunction, discussed with urology, recommended CT abdomen/pelvis -> CT shows L nephrostomy tube without hydro and improving hematoma in L retroperitoneum.  CT notes anasarca with  effusion. Volume overload likely cause of AKI Needs IV diuresis, will give lasix + albumin and follow   Malfunction of nephrostomy tube ([place]) Dislodged prior to admission Now s/p exchange of L perc nephrostomy tube - L renal collecting system widely patent, nephrostomy tube capped.   Consider removal in near future. He has follow up with IR arranged Hematuria after procedure, follow, this should resolve - appears to be improving  Bilateral pleural effusion Volume overload in setting of HF lasix Follow AM CXR CT with large R and moderate L effusions  S/P AKA (above knee amputation) bilateral (HCC) S/p L AKA on 07/01/21 Some erythema and mild discomfort to medial portion of wound.  Will start duricef.  No drainage or fluctuance appreciated. Staples removed by vascular on 11/21.  Will d/c abx as vascular suspects erythema due to staple irritation.   Hematuria After procedure Follow for resolution - improving  Chronic systolic heart failure (HCC) - LVEF 25 to 30% CT scan with anasarca IV lasix with albumin    Chronic hypotension midodrine  History of venous thromboembolism Continue eliquis Hx DVT and PE  Dyslipidemia statin  Type 2 diabetes mellitus (HCC) SSI  Elevated LFTs Will monitor, continue lipitor, amiodarone for now CT with hyperdense liver, nonspecific (2/2 amiodarone vs iron deposition)?  Pressure ulcer Stage II Frequent turns, wound care per nursing  AF (paroxysmal atrial fibrillation) (HCC) Eliquis, amio  DVT prophylaxis: eliquis Code Status: full  Family Communication: discussed with sister and daughter Disposition:   Status is: Observation  The patient will require care spanning > 2 midnights and should be moved to inpatient because: pending discharge      Consultants:  IR  Procedures:  Exchange of L perc nephrostomy tube 11/18  Antimicrobials:  Anti-infectives (From admission, onward)    Start     Dose/Rate Route Frequency  Ordered Stop   08/10/21 1345  cefadroxil (DURICEF) capsule 500 mg        500 mg Oral 2 times daily 08/10/21 1250            Subjective: Denies any complaints  Objective: Vitals:   08/10/21 1342 08/10/21 2129 08/11/21 0432 08/11/21 1447  BP: 113/63 117/63 112/66 110/68  Pulse: 64 67 62 62  Resp: 16 18 16 20   Temp: 98.7 F (37.1 C) (!) 97.4 F (36.3 C) 97.9 F (36.6 C) 97.7 F (36.5 C)  TempSrc:  Oral Oral Oral  SpO2: 98% 100% 97% 97%    Intake/Output Summary (Last 24 hours) at 08/11/2021 2051 Last data filed at 08/11/2021 1826 Gross per 24 hour  Intake 3360 ml  Output 200 ml  Net 3160 ml   There were no vitals filed for this visit.  Examination:  General: No acute distress. Cardiovascular: RRR Lungs: unlabored Abdomen: Soft, nontender, nondistended  Neurological: Alert and oriented 3. Moves all extremities 4. Cranial nerves II through XII grossly intact. Skin: Warm and dry. No rashes or lesions. Extremities: bilateral AKA    Data Reviewed: I have personally reviewed following labs and imaging studies  CBC: Recent Labs  Lab 08/10/2021 2206 08/07/21 0658 08/08/21 0508 08/09/21 0545 08/10/21 0513 08/11/21 0901  WBC 5.7 6.9 6.6 8.6 8.4 8.1  NEUTROABS 5.2  --   --   --  6.0 5.8  HGB 8.6* 8.4* 8.8* 9.0* 9.0* 9.7*  HCT 28.0* 27.3* 28.3* 29.1* 29.1* 31.6*  MCV 73.7* 73.2* 72.6* 72.2* 73.7* 72.6*  PLT 197 191 210 208 207 PLATELET CLUMPS NOTED ON SMEAR, UNABLE TO ESTIMATE    Basic Metabolic Panel: Recent Labs  Lab 08/07/21 0658 08/08/21 0508 08/09/21 0545 08/10/21 0513 08/11/21 0901  NA 138 139 138 136 137  K 4.4 4.5 4.6 4.8 5.2*  CL 110 112* 109 109 109  CO2 19* 18* 20* 18* 20*  GLUCOSE 94 66* 80 83 91  BUN 49* 48* 47* 41* 40*  CREATININE 1.52* 1.42* 1.67* 1.51* 1.56*  CALCIUM 8.6* 8.4* 8.5* 8.2* 8.0*  MG  --  2.4 2.4 2.2  --   PHOS  --   --   --  3.0  --     GFR: CrCl cannot be calculated (Unknown ideal weight.).  Liver Function  Tests: Recent Labs  Lab 08/10/21 0513 08/11/21 0901  AST 95* 118*  ALT 55* 58*  ALKPHOS 136* 124  BILITOT 1.0 1.3*  PROT 6.0* 5.8*  ALBUMIN 2.1* 2.0*    CBG: Recent Labs  Lab 08/11/21 0743 08/11/21 0817 08/11/21 1126 08/11/21 1652 08/11/21 1902  GLUCAP 65* 76 91 71 105*     Recent Results (from the past 240 hour(s))  Resp Panel by RT-PCR (Flu Kassadie Pancake&B, Covid) Nasopharyngeal Swab     Status: None   Collection Time: 08/18/2021 10:14 PM   Specimen: Nasopharyngeal Swab; Nasopharyngeal(NP) swabs in vial transport medium  Result Value Ref Range Status   SARS Coronavirus 2 by RT PCR NEGATIVE NEGATIVE Final    Comment: (NOTE) SARS-CoV-2 target nucleic acids are NOT DETECTED.  The SARS-CoV-2 RNA is generally detectable in upper respiratory specimens during the  acute phase of infection. The lowest concentration of SARS-CoV-2 viral copies this assay can detect is 138 copies/mL. Marlis Oldaker negative result does not preclude SARS-Cov-2 infection and should not be used as the sole basis for treatment or other patient management decisions. Deward Sebek negative result may occur with  improper specimen collection/handling, submission of specimen other than nasopharyngeal swab, presence of viral mutation(s) within the areas targeted by this assay, and inadequate number of viral copies(<138 copies/mL). Laritza Vokes negative result must be combined with clinical observations, patient history, and epidemiological information. The expected result is Negative.  Fact Sheet for Patients:  EntrepreneurPulse.com.au  Fact Sheet for Healthcare Providers:  IncredibleEmployment.be  This test is no t yet approved or cleared by the [place] FDA and  has been authorized for detection and/or diagnosis of SARS-CoV-2 by FDA under an Emergency Use Authorization (EUA). This EUA will remain  in effect (meaning this test can be used) for the duration of the COVID-19 declaration under Section  564(b)(1) of the Act, 21 U.S.C.section 360bbb-3(b)(1), unless the authorization is terminated  or revoked sooner.       Influenza Chloe Bluett by PCR NEGATIVE NEGATIVE Final   Influenza B by PCR NEGATIVE NEGATIVE Final    Comment: (NOTE) The Xpert Xpress SARS-CoV-2/FLU/RSV plus assay is intended as an aid in the diagnosis of influenza from Nasopharyngeal swab specimens and should not be used as Zae Kirtz sole basis for treatment. Nasal washings and aspirates are unacceptable for Xpert Xpress SARS-CoV-2/FLU/RSV testing.  Fact Sheet for Patients: EntrepreneurPulse.com.au  Fact Sheet for Healthcare Providers: IncredibleEmployment.be  This test is not yet approved or cleared by the [place] FDA and has been authorized for detection and/or diagnosis of SARS-CoV-2 by FDA under an Emergency Use Authorization (EUA). This EUA will remain in effect (meaning this test can be used) for the duration of the COVID-19 declaration under Section 564(b)(1) of the Act, 21 U.S.C. section 360bbb-3(b)(1), unless the authorization is terminated or revoked.  Performed at [facility], [place] [address] [place], [place] [address]   MRSA Next Gen by PCR, Nasal     Status: Abnormal   Collection Time: 08/08/21  3:06 PM   Specimen: Nasal Mucosa; Nasal Swab  Result Value Ref Range Status   MRSA by PCR Next Gen DETECTED (Panagiota Perfetti) NOT DETECTED Final    Comment: (NOTE) The GeneXpert MRSA Assay (FDA approved for NASAL specimens only), is one component of Lamin Chandley comprehensive MRSA colonization surveillance program. It is not intended to diagnose MRSA infection nor to guide or monitor treatment for MRSA infections. Test performance is not FDA approved in patients less than 71 years old. Performed at [facility], [place] [address] [place], [place] [address]          Radiology Studies: CT RENAL STONE STUDY  Result Date: 08/11/2021 CLINICAL DATA:  History  of acute renal failure in this 75 year old male. EXAM: CT ABDOMEN AND PELVIS WITHOUT CONTRAST TECHNIQUE: Multidetector CT imaging of the abdomen and pelvis was performed following the standard protocol without IV contrast. COMPARISON:  July 02, 2021. FINDINGS: Lower chest: Since the previous exam there has been interval development of large RIGHT and moderate LEFT pleural effusions. Basilar airspace disease is present bilaterally. Heart size is enlarged with signs of mitral valve replacement. No visible pericardial effusion. Heart is incompletely imaged. Cardiac leads in the RIGHT heart are partially evaluated. Low-attenuation of cardiac blood pool compatible with anemia. Hepatobiliary: Hyperdense liver it at 81 Hounsfield units. The under distended gallbladder. No gross  signs of inflammation. Cholelithiasis. Pancreas: Pancreatic atrophy. Areas of low attenuation in the pancreatic head are grossly stable but not well evaluated in the setting of anasarca. No gross signs of pancreatic inflammation. Spleen: Normal in size and contour. Adrenals/Urinary Tract: Adrenal glands are normal. Signs of LEFT-sided nephrostomy tube placement. Marker for sideholes at the level of the LEFT renal pelvis well within the expected location of the collecting systems and not associated with hydronephrosis or substantial perinephric stranding. No RIGHT-sided hydronephrosis. Urinary bladder with small amount of gas in the anterior urinary bladder. Smooth contour of the urinary bladder. Stomach/Bowel: Large volume of stool in the rectum slightly diminished compared to previous imaging but still substantial. No signs of obstruction related to this process. Scattered stool and gas throughout the remainder of the colon. Stomach under distended limiting assessment. No signs of small bowel distension. Small bowel floats in small volume ascites. Ascites has developed since the previous study and is associated with generalized anasarca.  Vascular/Lymphatic: Aortic atherosclerosis. No aneurysmal dilation. No adenopathy in the abdomen or pelvis. Reproductive: Unremarkable by CT. Other: No free air.  Small volume ascites. Hematoma in the LEFT retroperitoneum continues to diminished in size measuring approximately 5.7 x 3.2 cm greatest axial dimension, when measured at Ellisha Bankson similar level and location approximately 8.4 x 3.1 cm on the previous study. Musculoskeletal: Diffuse body wall edema. No acute or destructive bone findings. Degenerative changes in the hips and spine. Multilevel endplate compression fractures in the lumbar spine with similar appearance. Most notably at the L2, L3 and L5 levels IMPRESSION: Interval development of anasarca with pleural effusions, body wall edema and ascites. Associated basilar volume loss in the setting of large RIGHT and moderate LEFT effusions. LEFT nephrostomy tube in place, no hydronephrosis. Hematoma in the LEFT retroperitoneum continues to diminished in size. Hyperdense liver, nonspecific but can be seen in the setting of amiodarone therapy or iron deposition. Cholelithiasis without gross signs of inflammation. Large volume of stool in the rectum slightly diminished compared to previous imaging but still substantial. No signs of obstruction related to this process. Gas in the urinary bladder as before, correlate with any instrumentation or with urinalysis as appropriate. Multilevel endplate compression fractures in the lumbar spine with similar appearance. Aortic Atherosclerosis (ICD10-I70.0). Electronically Signed   By: Zetta Bills M.D.   On: 08/11/2021 15:58        Scheduled Meds:  amiodarone  200 mg Oral Daily   apixaban  5 mg Oral Q12H   atorvastatin  40 mg Oral QHS   cefadroxil  500 mg Oral BID   Chlorhexidine Gluconate Cloth  6 each Topical Q0600   [START ON 08/12/2021] furosemide  20 mg Intravenous Daily   gabapentin  300 mg Oral QHS   insulin aspart  0-9 Units Subcutaneous TID WC    methocarbamol  500 mg Oral Q12H   midodrine  10 mg Oral TID WC   mupirocin ointment  1 application Nasal BID   Continuous Infusions:     LOS: 0 days    Time spent: over 30 min    Fayrene Helper, MD [facility]   To contact the attending provider between 7A-7P or the covering provider during after hours 7P-7A, please log into the web site [email or web address] and access using universal [place] password for that web site. If you do not have the password, please call the hospital operator.  08/11/2021, 8:51 PM

## 2021-08-11 NOTE — Assessment & Plan Note (Addendum)
Volume overload in setting of HF lasix Follow AM CXR CT with large R and moderate L effusions

## 2021-08-11 NOTE — Progress Notes (Signed)
CNA notified nurse of CBG 65, orange juice given to patient. Agricultural consultant notified. Patient is currently eating breakfast and is asymptomatic at this time. Recheck CBG noted to be 76. MD notified.

## 2021-08-11 NOTE — TOC Progression Note (Signed)
Transition of Care [facility]) - Progression Note    Patient Details  Name: Ryan [name] MRN: [id number] Date of Birth: [birth year]  Transition of Care [facility]) CM/SW [place], [place] Phone Number: 08/11/2021, 1:55 PM  Clinical Narrative:   Damaris Schooner with Vida Roller at [facility] who needs insurance authorization prior to return.  PT note generated today,  Appreciate order and timeliness of PT department.  Alerted Kelli that note is in place.  Unclear when patient will be stable for d/c, is not today. TOC will continue to follow during the course of hospitalization.     Expected Discharge Plan: Skilled Nursing Facility Barriers to Discharge: Other (must enter comment) (insurance authorization)  Expected Discharge Plan and Services Expected Discharge Plan: [place]   Discharge Planning Services: CM Consult   Living arrangements for the past 2 months: [place]                                       Social Determinants of Health (SDOH) Interventions    Readmission Risk Interventions Readmission Risk Prevention Plan 07/09/2021 04/15/2021  Transportation Screening Complete -  PCP or Specialist Appt within 5-7 Days Complete -  Home Care Screening Complete Complete  Medication Review (RN CM) Complete Complete  Some recent data might be hidden

## 2021-08-12 ENCOUNTER — Observation Stay (HOSPITAL_COMMUNITY): Payer: Medicare HMO

## 2021-08-12 DIAGNOSIS — D509 Iron deficiency anemia, unspecified: Secondary | ICD-10-CM | POA: Diagnosis not present

## 2021-08-12 DIAGNOSIS — Z48813 Encounter for surgical aftercare following surgery on the respiratory system: Secondary | ICD-10-CM | POA: Diagnosis not present

## 2021-08-12 DIAGNOSIS — I5043 Acute on chronic combined systolic (congestive) and diastolic (congestive) heart failure: Secondary | ICD-10-CM | POA: Diagnosis present

## 2021-08-12 DIAGNOSIS — T83098A Other mechanical complication of other indwelling urethral catheter, initial encounter: Secondary | ICD-10-CM | POA: Diagnosis not present

## 2021-08-12 DIAGNOSIS — R109 Unspecified abdominal pain: Secondary | ICD-10-CM | POA: Diagnosis not present

## 2021-08-12 DIAGNOSIS — E860 Dehydration: Secondary | ICD-10-CM | POA: Diagnosis present

## 2021-08-12 DIAGNOSIS — Z20822 Contact with and (suspected) exposure to covid-19: Secondary | ICD-10-CM | POA: Diagnosis present

## 2021-08-12 DIAGNOSIS — I480 Paroxysmal atrial fibrillation: Secondary | ICD-10-CM | POA: Diagnosis present

## 2021-08-12 DIAGNOSIS — R188 Other ascites: Secondary | ICD-10-CM | POA: Diagnosis not present

## 2021-08-12 DIAGNOSIS — I420 Dilated cardiomyopathy: Secondary | ICD-10-CM | POA: Diagnosis present

## 2021-08-12 DIAGNOSIS — I517 Cardiomegaly: Secondary | ICD-10-CM | POA: Diagnosis not present

## 2021-08-12 DIAGNOSIS — N179 Acute kidney failure, unspecified: Secondary | ICD-10-CM | POA: Diagnosis present

## 2021-08-12 DIAGNOSIS — I9589 Other hypotension: Secondary | ICD-10-CM | POA: Diagnosis present

## 2021-08-12 DIAGNOSIS — R7989 Other specified abnormal findings of blood chemistry: Secondary | ICD-10-CM

## 2021-08-12 DIAGNOSIS — J9601 Acute respiratory failure with hypoxia: Secondary | ICD-10-CM | POA: Diagnosis present

## 2021-08-12 DIAGNOSIS — Z86718 Personal history of other venous thrombosis and embolism: Secondary | ICD-10-CM | POA: Diagnosis not present

## 2021-08-12 DIAGNOSIS — Y732 Prosthetic and other implants, materials and accessory gastroenterology and urology devices associated with adverse incidents: Secondary | ICD-10-CM | POA: Diagnosis present

## 2021-08-12 DIAGNOSIS — N183 Chronic kidney disease, stage 3 unspecified: Secondary | ICD-10-CM | POA: Diagnosis present

## 2021-08-12 DIAGNOSIS — Z66 Do not resuscitate: Secondary | ICD-10-CM | POA: Diagnosis not present

## 2021-08-12 DIAGNOSIS — L89322 Pressure ulcer of left buttock, stage 2: Secondary | ICD-10-CM | POA: Diagnosis present

## 2021-08-12 DIAGNOSIS — T83012A Breakdown (mechanical) of nephrostomy catheter, initial encounter: Secondary | ICD-10-CM | POA: Diagnosis present

## 2021-08-12 DIAGNOSIS — D631 Anemia in chronic kidney disease: Secondary | ICD-10-CM | POA: Diagnosis present

## 2021-08-12 DIAGNOSIS — I5084 End stage heart failure: Secondary | ICD-10-CM | POA: Diagnosis present

## 2021-08-12 DIAGNOSIS — I5082 Biventricular heart failure: Secondary | ICD-10-CM | POA: Diagnosis present

## 2021-08-12 DIAGNOSIS — Z89612 Acquired absence of left leg above knee: Secondary | ICD-10-CM | POA: Diagnosis not present

## 2021-08-12 DIAGNOSIS — E872 Acidosis, unspecified: Secondary | ICD-10-CM | POA: Diagnosis present

## 2021-08-12 DIAGNOSIS — I959 Hypotension, unspecified: Secondary | ICD-10-CM | POA: Diagnosis not present

## 2021-08-12 DIAGNOSIS — Z515 Encounter for palliative care: Secondary | ICD-10-CM | POA: Diagnosis not present

## 2021-08-12 DIAGNOSIS — J90 Pleural effusion, not elsewhere classified: Secondary | ICD-10-CM

## 2021-08-12 DIAGNOSIS — J948 Other specified pleural conditions: Secondary | ICD-10-CM | POA: Diagnosis not present

## 2021-08-12 DIAGNOSIS — J918 Pleural effusion in other conditions classified elsewhere: Secondary | ICD-10-CM | POA: Diagnosis present

## 2021-08-12 DIAGNOSIS — D649 Anemia, unspecified: Secondary | ICD-10-CM | POA: Diagnosis not present

## 2021-08-12 DIAGNOSIS — R0902 Hypoxemia: Secondary | ICD-10-CM | POA: Diagnosis not present

## 2021-08-12 DIAGNOSIS — I130 Hypertensive heart and chronic kidney disease with heart failure and stage 1 through stage 4 chronic kidney disease, or unspecified chronic kidney disease: Secondary | ICD-10-CM | POA: Diagnosis present

## 2021-08-12 DIAGNOSIS — E1122 Type 2 diabetes mellitus with diabetic chronic kidney disease: Secondary | ICD-10-CM | POA: Diagnosis present

## 2021-08-12 DIAGNOSIS — Z936 Other artificial openings of urinary tract status: Secondary | ICD-10-CM | POA: Diagnosis not present

## 2021-08-12 DIAGNOSIS — I5023 Acute on chronic systolic (congestive) heart failure: Secondary | ICD-10-CM

## 2021-08-12 DIAGNOSIS — I509 Heart failure, unspecified: Secondary | ICD-10-CM | POA: Diagnosis not present

## 2021-08-12 DIAGNOSIS — Z89611 Acquired absence of right leg above knee: Secondary | ICD-10-CM | POA: Diagnosis not present

## 2021-08-12 DIAGNOSIS — E1151 Type 2 diabetes mellitus with diabetic peripheral angiopathy without gangrene: Secondary | ICD-10-CM | POA: Diagnosis present

## 2021-08-12 DIAGNOSIS — I471 Supraventricular tachycardia: Secondary | ICD-10-CM | POA: Diagnosis present

## 2021-08-12 DIAGNOSIS — Z7189 Other specified counseling: Secondary | ICD-10-CM | POA: Diagnosis not present

## 2021-08-12 DIAGNOSIS — J9690 Respiratory failure, unspecified, unspecified whether with hypoxia or hypercapnia: Secondary | ICD-10-CM | POA: Diagnosis not present

## 2021-08-12 DIAGNOSIS — L89312 Pressure ulcer of right buttock, stage 2: Secondary | ICD-10-CM | POA: Diagnosis present

## 2021-08-12 DIAGNOSIS — M6281 Muscle weakness (generalized): Secondary | ICD-10-CM | POA: Diagnosis not present

## 2021-08-12 LAB — COMPREHENSIVE METABOLIC PANEL
ALT: 60 U/L — ABNORMAL HIGH (ref 0–44)
AST: 95 U/L — ABNORMAL HIGH (ref 15–41)
Albumin: 2.4 g/dL — ABNORMAL LOW (ref 3.5–5.0)
Alkaline Phosphatase: 133 U/L — ABNORMAL HIGH (ref 38–126)
Anion gap: 9 (ref 5–15)
BUN: 40 mg/dL — ABNORMAL HIGH (ref 8–23)
CO2: 19 mmol/L — ABNORMAL LOW (ref 22–32)
Calcium: 7.9 mg/dL — ABNORMAL LOW (ref 8.9–10.3)
Chloride: 109 mmol/L (ref 98–111)
Creatinine, Ser: 1.78 mg/dL — ABNORMAL HIGH (ref 0.61–1.24)
GFR, Estimated: 39 mL/min — ABNORMAL LOW (ref >60)
Glucose, Bld: 112 mg/dL — ABNORMAL HIGH (ref 70–99)
Potassium: 4.8 mmol/L (ref 3.5–5.1)
Sodium: 137 mmol/L (ref 135–145)
Total Bilirubin: 1 mg/dL (ref 0.3–1.2)
Total Protein: 6 g/dL — ABNORMAL LOW (ref 6.5–8.1)

## 2021-08-12 LAB — LIPASE, BLOOD: Lipase: 23 U/L (ref 11–51)

## 2021-08-12 LAB — TROPONIN I (HIGH SENSITIVITY): Troponin I (High Sensitivity): 18 ng/L — ABNORMAL HIGH (ref <18)

## 2021-08-12 LAB — GLUCOSE, CAPILLARY
Glucose-Capillary: 98 mg/dL (ref 70–99)
Glucose-Capillary: 143 mg/dL — ABNORMAL HIGH (ref 70–99)
Glucose-Capillary: 129 mg/dL — ABNORMAL HIGH (ref 70–99)
Glucose-Capillary: 76 mg/dL (ref 70–99)

## 2021-08-12 MED ORDER — ALUM & MAG HYDROXIDE-SIMETH 200-200-20 MG/5ML PO SUSP
30.0000 mL | Freq: Once | ORAL | Status: AC
  Administered 2021-08-12: 30 mL via ORAL
  Filled 2021-08-12: qty 30

## 2021-08-12 MED ORDER — MORPHINE SULFATE (PF) 2 MG/ML IV SOLN
2.0000 mg | Freq: Once | INTRAVENOUS | Status: DC
  Filled 2021-08-12 (×2): qty 1

## 2021-08-12 MED ORDER — ALUM & MAG HYDROXIDE-SIMETH 200-200-20 MG/5ML PO SUSP
30.0000 mL | ORAL | Status: DC | PRN
  Administered 2021-08-12 – 2021-08-14 (×3): 30 mL via ORAL
  Filled 2021-08-12 (×3): qty 30

## 2021-08-12 NOTE — Progress Notes (Signed)
PROGRESS NOTE    Ryan [name]  FUX:[id number] DOB: 1946-09-13 DOA: 08/03/2021 PCP: [name], MD   Brief Narrative: Ryan [name] is a 75 y.o. male with a history of HFrEF and EF of 25% status post ICD, hypertension, diabetes mellitus type 2, peripheral artery disease, bilateral AKA's, DVT/PE, A. fib on Eliquis, CABG.  Patient presented secondary to dislodged nephrostomy tube, status postexchange.  Patient also noted to have evidence of volume overload and resultant AKI.  Patient started on IV diuresis.   Assessment & Plan:   * AKI (acute kidney injury) ([place]) Present on admission with concern for possible dehydration. AKI worsened during admission. Renal ultrasound significant for no hydronephrosis. CT imaging significant for anasarca. Lasix IV restarted. -Continue Lasix today; repeat BMP in AM  Acute on chronic systolic CHF (congestive heart failure) (HCC) EF of 25-30%. AICD in place. Evidence of overload this admission likely related to IV fluids. Lasix IV diuresis started. Net positive 5.587 L but in/out do not appear accurate. -Continue Lasix today and watch creatinine daily -Strict in/out  Bilateral pleural effusion Secondary to volume overload in setting of heart failure. On room air.  Elevated LFTs Mild. Stable.  S/P AKA (above knee amputation) bilateral (HCC) Noted.  Dyslipidemia -Continue Lipitor  Chronic hypotension Blood pressure well controlled currently. On midodrine. -Continue midodrine  Malfunction of nephrostomy tube (HCC) Dislodged prior to admission. Left percutaneous nephrostomy tube exchanged on 11/18. Outpatient follow-up with IR.  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  Type 2 diabetes mellitus ([place]) Patient is on Humalog as an outpatient. -Continue SSI  AF (paroxysmal atrial fibrillation) (HCC) -Continue Eliquis and  amiodarone  Abdominal pain-resolved as of 08/12/2021 Likely GI related. EKG does not appear to be suggestive of ACS. Troponin obtained and is stable. Location of pain is not near chest. Symptoms improved with Maalox. Now resolved.    DVT prophylaxis: Eliquis Code Status:   Code Status: Full Code Family Communication: None at bedside Disposition Plan: Discharge to SNF pending bed availability in addition to improvement of pain/transition to oral Lasix   Consultants:  None  Procedures:  None  Antimicrobials: None    Subjective: This morning, patient developed non-exertional pain initially stated as chest pain but more localized to epigastric/periumbilical area of abdomen. Some dyspnea associated without hypoxia.   Objective: Vitals:   08/11/21 1447 08/11/21 2051 08/12/21 0509 08/12/21 1330  BP: 110/68 (!) 106/58 111/64 116/67  Pulse: 62 67 60 (!) 59  Resp: 20 18 18 16   Temp: 97.7 F (36.5 C) 98 F (36.7 C) 97.7 F (36.5 C) (!) 97.5 F (36.4 C)  TempSrc: Oral Oral Oral Oral  SpO2: 97% 99% 99% 100%    Intake/Output Summary (Last 24 hours) at 08/12/2021 1606 Last data filed at 08/12/2021 1300 Gross per 24 hour  Intake 630 ml  Output --  Net 630 ml   There were no vitals filed for this visit.  Examination:  General exam: Appears calm and comfortable and in no acute distress. Conversant Respiratory: Clear to auscultation. Respiratory effort normal with no intercostal retractions or use of accessory muscles Cardiovascular: S1 & S2 heard, RRR. 2/6 murmurs. No extremity edema noted Gastrointestinal: Abdomen is non-distended, soft and mildly tender in periumbilical/epigastric area. No masses felt. Decreased bowel sounds heard Neurologic: No focal neurological deficits Musculoskeletal: No calf tenderness Skin: No cyanosis. No new rashes Psychiatry: Alert and oriented. Memory intact.  Mood & affect appropriate     Data Reviewed: I have personally reviewed following  labs and imaging studies  CBC Lab Results  Component Value Date   WBC 8.1 08/11/2021   RBC 4.35 08/11/2021   HGB 9.7 (L) 08/11/2021   HCT 31.6 (L) 08/11/2021   MCV 72.6 (L) 08/11/2021   MCH 22.3 (L) 08/11/2021   PLT PLATELET CLUMPS NOTED ON SMEAR, UNABLE TO ESTIMATE 08/11/2021   MCHC 30.7 08/11/2021   RDW 27.9 (H) 08/11/2021   LYMPHSABS 1.1 08/11/2021   MONOABS 0.8 08/11/2021   EOSABS 0.2 08/11/2021   BASOSABS 0.1 89/38/1017     Last metabolic panel Lab Results  Component Value Date   NA 137 08/12/2021   K 4.8 08/12/2021   CL 109 08/12/2021   CO2 19 (L) 08/12/2021   BUN 40 (H) 08/12/2021   CREATININE 1.78 (H) 08/12/2021   GLUCOSE 112 (H) 08/12/2021   GFRNONAA 39 (L) 08/12/2021   GFRAA 39 (L) 10/01/2020   CALCIUM 7.9 (L) 08/12/2021   PHOS 3.0 08/10/2021   PROT 6.0 (L) 08/12/2021   ALBUMIN 2.4 (L) 08/12/2021   LABGLOB 2.2 03/09/2021   BILITOT 1.0 08/12/2021   ALKPHOS 133 (H) 08/12/2021   AST 95 (H) 08/12/2021   ALT 60 (H) 08/12/2021   ANIONGAP 9 08/12/2021    CBG (last 3)  Recent Labs    08/11/21 2108 08/12/21 0803 08/12/21 1150  GLUCAP 101* 98 143*     GFR: CrCl cannot be calculated (Unknown ideal weight.).  Coagulation Profile: Recent Labs  Lab 08/08/21 0508  INR 3.5*    Recent Results (from the past 240 hour(s))  Resp Panel by RT-PCR (Flu A&B, Covid) Nasopharyngeal Swab     Status: None   Collection Time: 08/04/2021 10:14 PM   Specimen: Nasopharyngeal Swab; Nasopharyngeal(NP) swabs in vial transport medium  Result Value Ref Range Status   SARS Coronavirus 2 by RT PCR NEGATIVE NEGATIVE Final    Comment: (NOTE) SARS-CoV-2 target nucleic acids are NOT DETECTED.  The SARS-CoV-2 RNA is generally detectable in upper respiratory specimens during the acute phase of infection. The lowest concentration of SARS-CoV-2 viral copies this assay can detect is 138 copies/mL. A negative result does not preclude SARS-Cov-2 infection and should not be used as  the sole basis for treatment or other patient management decisions. A negative result may occur with  improper specimen collection/handling, submission of specimen other than nasopharyngeal swab, presence of viral mutation(s) within the areas targeted by this assay, and inadequate number of viral copies(<138 copies/mL). A negative result must be combined with clinical observations, patient history, and epidemiological information. The expected result is Negative.  Fact Sheet for Patients:  EntrepreneurPulse.com.au  Fact Sheet for Healthcare Providers:  IncredibleEmployment.be  This test is no t yet approved or cleared by the [place] FDA and  has been authorized for detection and/or diagnosis of SARS-CoV-2 by FDA under an Emergency Use Authorization (EUA). This EUA will remain  in effect (meaning this test can be used) for the duration of the COVID-19 declaration under Section 564(b)(1) of the Act, 21 U.S.C.section 360bbb-3(b)(1), unless the authorization is terminated  or revoked sooner.       Influenza A by PCR NEGATIVE NEGATIVE Final   Influenza B by PCR NEGATIVE NEGATIVE Final    Comment: (NOTE) The Xpert Xpress SARS-CoV-2/FLU/RSV plus assay is intended as an aid in the diagnosis of influenza from Nasopharyngeal swab specimens and should not be used as a sole basis for treatment.  Nasal washings and aspirates are unacceptable for Xpert Xpress SARS-CoV-2/FLU/RSV testing.  Fact Sheet for Patients: EntrepreneurPulse.com.au  Fact Sheet for Healthcare Providers: IncredibleEmployment.be  This test is not yet approved or cleared by the [place] FDA and has been authorized for detection and/or diagnosis of SARS-CoV-2 by FDA under an Emergency Use Authorization (EUA). This EUA will remain in effect (meaning this test can be used) for the duration of the COVID-19 declaration under Section 564(b)(1) of  the Act, 21 U.S.C. section 360bbb-3(b)(1), unless the authorization is terminated or revoked.  Performed at [facility], [place] [address] [place], [place] [address]   MRSA Next Gen by PCR, Nasal     Status: Abnormal   Collection Time: 08/08/21  3:06 PM   Specimen: Nasal Mucosa; Nasal Swab  Result Value Ref Range Status   MRSA by PCR Next Gen DETECTED (A) NOT DETECTED Final    Comment: (NOTE) The GeneXpert MRSA Assay (FDA approved for NASAL specimens only), is one component of a comprehensive MRSA colonization surveillance program. It is not intended to diagnose MRSA infection nor to guide or monitor treatment for MRSA infections. Test performance is not FDA approved in patients less than 34 years old. Performed at [facility], [place] [address] [place], [place] [address]         Radiology Studies: DG CHEST PORT 1 VIEW  Result Date: 08/12/2021 CLINICAL DATA:  Pleural effusion. EXAM: PORTABLE CHEST 1 VIEW COMPARISON:  July 01, 2021. FINDINGS: Stable cardiomegaly. Status post mitral valve repair. Left-sided fibrillator is unchanged in position. Increased right pleural effusion is noted which most likely is loculated. Increased right lung opacity is noted concerning for edema, atelectasis or possibly infiltrate. Increased left basilar atelectasis or infiltrate is noted with probable small pleural effusion. Bony thorax is unremarkable. IMPRESSION: Increased bilateral lung opacities are noted, right greater than left, with associated pleural effusions. Electronically Signed   By: Marijo Conception M.D.   On: 08/12/2021 08:13   DG Abd Portable 1V  Result Date: 08/12/2021 CLINICAL DATA:  Abdominal pain EXAM: PORTABLE ABDOMEN - 1 VIEW COMPARISON:  KUB 04/29/2021, CT abdomen/pelvis 1 day prior FINDINGS: There is a nonobstructive bowel gas pattern. A left-sided nephrostomy tube is seen coiled over the left hemiabdomen. There is no abnormal soft tissue  calcification. There is no definite free intraperitoneal air, suboptimally evaluated given supine technique. There is no acute osseous abnormality. A cardiac device leads, median sternotomy wires, and cardiac valve prosthesis are noted in the chest. There are bilateral pleural effusions with left basilar airspace disease, better assessed on the same day chest radiograph. IMPRESSION: Nonobstructive bowel gas pattern. No other acute finding in the abdomen. Electronically Signed   By: Valetta Mole M.D.   On: 08/12/2021 11:01   CT RENAL STONE STUDY  Result Date: 08/11/2021 CLINICAL DATA:  History of acute renal failure in this 75 year old male. EXAM: CT ABDOMEN AND PELVIS WITHOUT CONTRAST TECHNIQUE: Multidetector CT imaging of the abdomen and pelvis was performed following the standard protocol without IV contrast. COMPARISON:  July 02, 2021. FINDINGS: Lower chest: Since the previous exam there has been interval development of large RIGHT and moderate LEFT pleural effusions. Basilar airspace disease is present bilaterally. Heart size is enlarged with signs of mitral valve replacement. No visible pericardial effusion. Heart is incompletely imaged. Cardiac leads in the RIGHT heart are partially evaluated. Low-attenuation of cardiac blood pool compatible with anemia. Hepatobiliary: Hyperdense liver it at 81 Hounsfield units. The under distended  gallbladder. No gross signs of inflammation. Cholelithiasis. Pancreas: Pancreatic atrophy. Areas of low attenuation in the pancreatic head are grossly stable but not well evaluated in the setting of anasarca. No gross signs of pancreatic inflammation. Spleen: Normal in size and contour. Adrenals/Urinary Tract: Adrenal glands are normal. Signs of LEFT-sided nephrostomy tube placement. Marker for sideholes at the level of the LEFT renal pelvis well within the expected location of the collecting systems and not associated with hydronephrosis or substantial perinephric  stranding. No RIGHT-sided hydronephrosis. Urinary bladder with small amount of gas in the anterior urinary bladder. Smooth contour of the urinary bladder. Stomach/Bowel: Large volume of stool in the rectum slightly diminished compared to previous imaging but still substantial. No signs of obstruction related to this process. Scattered stool and gas throughout the remainder of the colon. Stomach under distended limiting assessment. No signs of small bowel distension. Small bowel floats in small volume ascites. Ascites has developed since the previous study and is associated with generalized anasarca. Vascular/Lymphatic: Aortic atherosclerosis. No aneurysmal dilation. No adenopathy in the abdomen or pelvis. Reproductive: Unremarkable by CT. Other: No free air.  Small volume ascites. Hematoma in the LEFT retroperitoneum continues to diminished in size measuring approximately 5.7 x 3.2 cm greatest axial dimension, when measured at a similar level and location approximately 8.4 x 3.1 cm on the previous study. Musculoskeletal: Diffuse body wall edema. No acute or destructive bone findings. Degenerative changes in the hips and spine. Multilevel endplate compression fractures in the lumbar spine with similar appearance. Most notably at the L2, L3 and L5 levels IMPRESSION: Interval development of anasarca with pleural effusions, body wall edema and ascites. Associated basilar volume loss in the setting of large RIGHT and moderate LEFT effusions. LEFT nephrostomy tube in place, no hydronephrosis. Hematoma in the LEFT retroperitoneum continues to diminished in size. Hyperdense liver, nonspecific but can be seen in the setting of amiodarone therapy or iron deposition. Cholelithiasis without gross signs of inflammation. Large volume of stool in the rectum slightly diminished compared to previous imaging but still substantial. No signs of obstruction related to this process. Gas in the urinary bladder as before, correlate with  any instrumentation or with urinalysis as appropriate. Multilevel endplate compression fractures in the lumbar spine with similar appearance. Aortic Atherosclerosis (ICD10-I70.0). Electronically Signed   By: Zetta Bills M.D.   On: 08/11/2021 15:58        Scheduled Meds:  amiodarone  200 mg Oral Daily   apixaban  5 mg Oral Q12H   atorvastatin  40 mg Oral QHS   cefadroxil  500 mg Oral BID   Chlorhexidine Gluconate Cloth  6 each Topical Q0600   furosemide  20 mg Intravenous Daily   gabapentin  300 mg Oral QHS   insulin aspart  0-9 Units Subcutaneous TID WC   methocarbamol  500 mg Oral Q12H   midodrine  10 mg Oral TID WC    morphine injection  2 mg Intravenous Once   mupirocin ointment  1 application Nasal BID   Continuous Infusions:   LOS: 0 days     Cordelia Poche, MD [facility] 08/12/2021, 4:06 PM  If 7PM-7AM, please contact night-coverage [email or web address]

## 2021-08-12 NOTE — Assessment & Plan Note (Signed)
-   Continue Eliquis 

## 2021-08-12 NOTE — Assessment & Plan Note (Addendum)
-  Hold Lipitor secondary to elevated AST/ALT

## 2021-08-12 NOTE — Assessment & Plan Note (Signed)
Continue Eliquis and amiodarone 

## 2021-08-12 NOTE — Assessment & Plan Note (Signed)
Noted  

## 2021-08-12 NOTE — Plan of Care (Signed)
  Problem: Nutrition: Goal: Adequate nutrition will be maintained Outcome: Progressing   Problem: Coping: Goal: Level of anxiety will decrease Outcome: Progressing   Problem: Elimination: Goal: Will not experience complications related to bowel motility Outcome: Progressing Goal: Will not experience complications related to urinary retention Outcome: Progressing   

## 2021-08-12 NOTE — Assessment & Plan Note (Signed)
Bilateral buttock, POA.

## 2021-08-12 NOTE — Assessment & Plan Note (Addendum)
EF of 25-30%. AICD in place. Evidence of overload this admission likely related to IV fluids. Lasix IV diuresis started. Inaccurate in/out. BNP >4500. -Lasix as mentioned above -Strict in/out, daily weights -May need to consult cardiology/heart failure team depending on goals of care/nephrology recommendations for possible HD/CRRT

## 2021-08-12 NOTE — Assessment & Plan Note (Addendum)
Present on admission with concern for possible dehydration. AKI worsened during admission. Renal ultrasound significant for no hydronephrosis. CT imaging significant for anasarca. Lasix IV restarted but patient has had minimal urine output. Nephrology consulted. UOP of 1.1 L over the last 24 hours. -Nephrology recommendations: Increase to Lasix 160 mg q6 hours, Zaroxolyn, fluid restriction of 800 mL per day -Strict in/out

## 2021-08-12 NOTE — Assessment & Plan Note (Addendum)
Likely GI related. EKG does not appear to be suggestive of ACS. Troponin obtained and is stable. Location of pain is not near chest. Abdominal x-ray without acute process. Symptoms improved with Maalox. Now resolved.

## 2021-08-12 NOTE — Assessment & Plan Note (Signed)
Patient is on Humalog as an outpatient. -Continue SSI

## 2021-08-12 NOTE — Assessment & Plan Note (Deleted)
Increased, likely in setting of heart failure -CMP in AM

## 2021-08-12 NOTE — Assessment & Plan Note (Addendum)
Secondary to volume overload in setting of heart failure. Associated acute respiratory failure with hypoxia, requiring up to 10 L/min secondary to worsening pleural effusion. Stat ultrasound thoracentesis ordered (11/27) with 1.3 L removed. Pleural fluid culture with no growth to date. Light's criteria suggests transudate, consistent with volume overload from renal/heart failure.

## 2021-08-12 NOTE — Hospital Course (Addendum)
Ryan [name] is a 75 y.o. male with a history of HFrEF and EF of 25% status post ICD, hypertension, diabetes mellitus type 2, peripheral artery disease, bilateral AKA's, DVT/PE, A. fib on Eliquis, CABG.  Patient presented secondary to dislodged nephrostomy tube, status postexchange.  Patient also noted to have evidence of volume overload and resultant AKI.  Patient started on IV diuresis and now with decreased urine output. Nephrology consulted.

## 2021-08-12 NOTE — Assessment & Plan Note (Addendum)
Blood pressure well controlled currently, but soft. On midodrine. -Continue midodrine

## 2021-08-12 NOTE — Assessment & Plan Note (Addendum)
Dislodged prior to admission. Left percutaneous nephrostomy tube exchanged on 11/18. Outpatient follow-up with IR. Now, does not appear to have urine output from tube. Documentation of purulent appearing discharge from ostomy site noted on 11/24. Afebrile. No recurrent drainage noted. Renal ultrasound without pathology noted. Nephrostomy tube removed on 11/25 per nephrology recommendations.

## 2021-08-13 DIAGNOSIS — R7401 Elevation of levels of liver transaminase levels: Secondary | ICD-10-CM

## 2021-08-13 DIAGNOSIS — N179 Acute kidney failure, unspecified: Secondary | ICD-10-CM | POA: Diagnosis not present

## 2021-08-13 LAB — COMPREHENSIVE METABOLIC PANEL
ALT: 123 U/L — ABNORMAL HIGH (ref 0–44)
AST: 273 U/L — ABNORMAL HIGH (ref 15–41)
Albumin: 2.3 g/dL — ABNORMAL LOW (ref 3.5–5.0)
Alkaline Phosphatase: 143 U/L — ABNORMAL HIGH (ref 38–126)
Anion gap: 9 (ref 5–15)
BUN: 45 mg/dL — ABNORMAL HIGH (ref 8–23)
CO2: 20 mmol/L — ABNORMAL LOW (ref 22–32)
Calcium: 8.2 mg/dL — ABNORMAL LOW (ref 8.9–10.3)
Chloride: 109 mmol/L (ref 98–111)
Creatinine, Ser: 1.95 mg/dL — ABNORMAL HIGH (ref 0.61–1.24)
GFR, Estimated: 35 mL/min — ABNORMAL LOW (ref >60)
Glucose, Bld: 81 mg/dL (ref 70–99)
Potassium: 5 mmol/L (ref 3.5–5.1)
Sodium: 138 mmol/L (ref 135–145)
Total Bilirubin: 1 mg/dL (ref 0.3–1.2)
Total Protein: 6.1 g/dL — ABNORMAL LOW (ref 6.5–8.1)

## 2021-08-13 LAB — GLUCOSE, CAPILLARY
Glucose-Capillary: 100 mg/dL — ABNORMAL HIGH (ref 70–99)
Glucose-Capillary: 64 mg/dL — ABNORMAL LOW (ref 70–99)
Glucose-Capillary: 83 mg/dL (ref 70–99)
Glucose-Capillary: 83 mg/dL (ref 70–99)
Glucose-Capillary: 104 mg/dL — ABNORMAL HIGH (ref 70–99)

## 2021-08-13 LAB — BRAIN NATRIURETIC PEPTIDE: B Natriuretic Peptide: >4500 pg/mL — ABNORMAL HIGH (ref 0.0–100.0)

## 2021-08-13 MED ORDER — FUROSEMIDE 10 MG/ML IJ SOLN
40.0000 mg | Freq: Every day | INTRAMUSCULAR | Status: DC
  Administered 2021-08-13 – 2021-08-15 (×2): 40 mg via INTRAVENOUS
  Filled 2021-08-13 (×3): qty 4

## 2021-08-13 NOTE — Plan of Care (Signed)
  Problem: Nutrition: Goal: Adequate nutrition will be maintained Outcome: Progressing   Problem: Coping: Goal: Level of anxiety will decrease Outcome: Progressing   Problem: Elimination: Goal: Will not experience complications related to bowel motility Outcome: Progressing Goal: Will not experience complications related to urinary retention Outcome: Progressing   

## 2021-08-13 NOTE — Progress Notes (Signed)
Upon changing patients sacral foam and cleaning stage 2 pressure ulcer. This LPN noticed foul smell and discharge from nephrostomy catheter. Removed dressing and greenish, blackish foul smelling discharge was noted. Charge nurse Jarrett Soho, RN advised to come look at the site. MD notified and dressings changed. Vitals stable. Patient is afebrile at this time.

## 2021-08-13 NOTE — Assessment & Plan Note (Addendum)
Likely related to acute heart failure. No symptoms concerning for biliary pathology. AST/ALT appear to have peaked and are trending down.

## 2021-08-13 NOTE — Progress Notes (Addendum)
Notified by CNA that patients CBG was 65. Hypoglycemic protocol started. Patient given orange juice, tolerated well. Will do repeat CBG. MD notified via page. Repeat CBG 0800 noted to be 100.

## 2021-08-13 NOTE — Progress Notes (Signed)
Patient noted to have an episode of epistaxis. Bleeding now controlled. MD notified of epistaxis, as well as charge nurse.

## 2021-08-13 NOTE — Progress Notes (Signed)
This nurse performed a bladder scan on patient due to minimal output noted overnight. Bladder scan noted to show 128 holding. MD notified.

## 2021-08-13 NOTE — Progress Notes (Signed)
PROGRESS NOTE    Ryan [name]  PRF:[id number] DOB: 06/07/1946 DOA: 07/23/2021 PCP: [name], MD   Brief Narrative: Ryan [name] is a 75 y.o. male with a history of HFrEF and EF of 25% status post ICD, hypertension, diabetes mellitus type 2, peripheral artery disease, bilateral AKA's, DVT/PE, A. fib on Eliquis, CABG.  Patient presented secondary to dislodged nephrostomy tube, status postexchange.  Patient also noted to have evidence of volume overload and resultant AKI.  Patient started on IV diuresis.   Assessment & Plan:   * AKI (acute kidney injury) ([place]) Present on admission with concern for possible dehydration. AKI worsened during admission. Renal ultrasound significant for no hydronephrosis. CT imaging significant for anasarca. Lasix IV restarted. -Continue Lasix today; repeat BMP in AM  Elevated transaminase level Likely related to acute heart failure. No symptoms concerning for biliary pathology.  Acute on chronic systolic CHF (congestive heart failure) (HCC) EF of 25-30%. AICD in place. Evidence of overload this admission likely related to IV fluids. Lasix IV diuresis started. Inaccurate in/out. BNP >4500 -Increase to Lasix 40 mg IV daily -Strict in/out, daily weights  Bilateral pleural effusion Secondary to volume overload in setting of heart failure. On room air.  Elevated LFTs Mild. Stable.  S/P AKA (above knee amputation) bilateral (HCC) Noted.  Dyslipidemia -Continue Lipitor  Chronic hypotension Blood pressure well controlled currently. On midodrine. -Continue midodrine  Malfunction of nephrostomy tube (HCC) Dislodged prior to admission. Left percutaneous nephrostomy tube exchanged on 11/18. Outpatient follow-up with IR.  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  Type 2 diabetes mellitus ([place]) Patient is on Humalog as an  outpatient. -Continue SSI  AF (paroxysmal atrial fibrillation) (HCC) -Continue Eliquis and amiodarone  Abdominal pain-resolved as of 08/12/2021 Likely GI related. EKG does not appear to be suggestive of ACS. Troponin obtained and is stable. Location of pain is not near chest. Abdominal x-ray without acute process. Symptoms improved with Maalox. Now resolved.    DVT prophylaxis: Eliquis Code Status:   Code Status: Full Code Family Communication: None at bedside Disposition Plan: Discharge to SNF pending bed availability in addition to improvement of pain/transition to oral Lasix   Consultants:  None  Procedures:  None  Antimicrobials: None    Subjective: No issues noted overnight.  Objective: Vitals:   08/12/21 1330 08/12/21 1957 08/13/21 0504 08/13/21 1418  BP: 116/67 114/72 116/69 118/71  Pulse: (!) 59 (!) 58 62 63  Resp: 16 20 18 18   Temp: (!) 97.5 F (36.4 C) 97.7 F (36.5 C) (!) 97.1 F (36.2 C) 98 F (36.7 C)  TempSrc: Oral Oral Axillary   SpO2: 100% 99% 96% 100%    Intake/Output Summary (Last 24 hours) at 08/13/2021 1644 Last data filed at 08/13/2021 1300 Gross per 24 hour  Intake 240 ml  Output 100 ml  Net 140 ml    There were no vitals filed for this visit.  Examination:  General exam: Appears calm and comfortable Respiratory system: Respiratory effort normal. No tachypnea Gastrointestinal system: Abdomen is nondistended Musculoskeletal: Bilateral AKA Skin: No cyanosis. No rashes    Data Reviewed: I have personally reviewed following labs and imaging studies  CBC Lab Results  Component Value Date   WBC 8.1 08/11/2021   RBC 4.35 08/11/2021   HGB 9.7 (L) 08/11/2021   HCT 31.6 (L) 08/11/2021   MCV 72.6 (L) 08/11/2021   MCH 22.3 (  L) 08/11/2021   PLT PLATELET CLUMPS NOTED ON SMEAR, UNABLE TO ESTIMATE 08/11/2021   MCHC 30.7 08/11/2021   RDW 27.9 (H) 08/11/2021   LYMPHSABS 1.1 08/11/2021   MONOABS 0.8 08/11/2021   EOSABS 0.2 08/11/2021    BASOSABS 0.1 14/78/2956     Last metabolic panel Lab Results  Component Value Date   NA 138 08/13/2021   K 5.0 08/13/2021   CL 109 08/13/2021   CO2 20 (L) 08/13/2021   BUN 45 (H) 08/13/2021   CREATININE 1.95 (H) 08/13/2021   GLUCOSE 81 08/13/2021   GFRNONAA 35 (L) 08/13/2021   GFRAA 39 (L) 10/01/2020   CALCIUM 8.2 (L) 08/13/2021   PHOS 3.0 08/10/2021   PROT 6.1 (L) 08/13/2021   ALBUMIN 2.3 (L) 08/13/2021   LABGLOB 2.2 03/09/2021   BILITOT 1.0 08/13/2021   ALKPHOS 143 (H) 08/13/2021   AST 273 (H) 08/13/2021   ALT 123 (H) 08/13/2021   ANIONGAP 9 08/13/2021    CBG (last 3)  Recent Labs    08/13/21 0802 08/13/21 1107 08/13/21 1616  GLUCAP 100* 83 83      GFR: CrCl cannot be calculated (Unknown ideal weight.).  Coagulation Profile: Recent Labs  Lab 08/08/21 0508  INR 3.5*     Recent Results (from the past 240 hour(s))  Resp Panel by RT-PCR (Flu A&B, Covid) Nasopharyngeal Swab     Status: None   Collection Time: 08/02/2021 10:14 PM   Specimen: Nasopharyngeal Swab; Nasopharyngeal(NP) swabs in vial transport medium  Result Value Ref Range Status   SARS Coronavirus 2 by RT PCR NEGATIVE NEGATIVE Final    Comment: (NOTE) SARS-CoV-2 target nucleic acids are NOT DETECTED.  The SARS-CoV-2 RNA is generally detectable in upper respiratory specimens during the acute phase of infection. The lowest concentration of SARS-CoV-2 viral copies this assay can detect is 138 copies/mL. A negative result does not preclude SARS-Cov-2 infection and should not be used as the sole basis for treatment or other patient management decisions. A negative result may occur with  improper specimen collection/handling, submission of specimen other than nasopharyngeal swab, presence of viral mutation(s) within the areas targeted by this assay, and inadequate number of viral copies(<138 copies/mL). A negative result must be combined with clinical observations, patient history, and  epidemiological information. The expected result is Negative.  Fact Sheet for Patients:  EntrepreneurPulse.com.au  Fact Sheet for Healthcare Providers:  IncredibleEmployment.be  This test is no t yet approved or cleared by the [place] FDA and  has been authorized for detection and/or diagnosis of SARS-CoV-2 by FDA under an Emergency Use Authorization (EUA). This EUA will remain  in effect (meaning this test can be used) for the duration of the COVID-19 declaration under Section 564(b)(1) of the Act, 21 U.S.C.section 360bbb-3(b)(1), unless the authorization is terminated  or revoked sooner.       Influenza A by PCR NEGATIVE NEGATIVE Final   Influenza B by PCR NEGATIVE NEGATIVE Final    Comment: (NOTE) The Xpert Xpress SARS-CoV-2/FLU/RSV plus assay is intended as an aid in the diagnosis of influenza from Nasopharyngeal swab specimens and should not be used as a sole basis for treatment. Nasal washings and aspirates are unacceptable for Xpert Xpress SARS-CoV-2/FLU/RSV testing.  Fact Sheet for Patients: EntrepreneurPulse.com.au  Fact Sheet for Healthcare Providers: IncredibleEmployment.be  This test is not yet approved or cleared by the [place] FDA and has been authorized for detection and/or diagnosis of SARS-CoV-2 by FDA under an Emergency Use Authorization (EUA). This EUA  will remain in effect (meaning this test can be used) for the duration of the COVID-19 declaration under Section 564(b)(1) of the Act, 21 U.S.C. section 360bbb-3(b)(1), unless the authorization is terminated or revoked.  Performed at [facility], [place] [address] [place], [place] [address]   MRSA Next Gen by PCR, Nasal     Status: Abnormal   Collection Time: 08/08/21  3:06 PM   Specimen: Nasal Mucosa; Nasal Swab  Result Value Ref Range Status   MRSA by PCR Next Gen DETECTED (A) NOT DETECTED Final     Comment: (NOTE) The GeneXpert MRSA Assay (FDA approved for NASAL specimens only), is one component of a comprehensive MRSA colonization surveillance program. It is not intended to diagnose MRSA infection nor to guide or monitor treatment for MRSA infections. Test performance is not FDA approved in patients less than 40 years old. Performed at [facility], [place] [address], [place], [place] [address]          Radiology Studies: DG CHEST PORT 1 VIEW  Result Date: 08/12/2021 CLINICAL DATA:  Pleural effusion. EXAM: PORTABLE CHEST 1 VIEW COMPARISON:  July 01, 2021. FINDINGS: Stable cardiomegaly. Status post mitral valve repair. Left-sided fibrillator is unchanged in position. Increased right pleural effusion is noted which most likely is loculated. Increased right lung opacity is noted concerning for edema, atelectasis or possibly infiltrate. Increased left basilar atelectasis or infiltrate is noted with probable small pleural effusion. Bony thorax is unremarkable. IMPRESSION: Increased bilateral lung opacities are noted, right greater than left, with associated pleural effusions. Electronically Signed   By: Marijo Conception M.D.   On: 08/12/2021 08:13   DG Abd Portable 1V  Result Date: 08/12/2021 CLINICAL DATA:  Abdominal pain EXAM: PORTABLE ABDOMEN - 1 VIEW COMPARISON:  KUB 04/29/2021, CT abdomen/pelvis 1 day prior FINDINGS: There is a nonobstructive bowel gas pattern. A left-sided nephrostomy tube is seen coiled over the left hemiabdomen. There is no abnormal soft tissue calcification. There is no definite free intraperitoneal air, suboptimally evaluated given supine technique. There is no acute osseous abnormality. A cardiac device leads, median sternotomy wires, and cardiac valve prosthesis are noted in the chest. There are bilateral pleural effusions with left basilar airspace disease, better assessed on the same day chest radiograph. IMPRESSION: Nonobstructive bowel gas  pattern. No other acute finding in the abdomen. Electronically Signed   By: Valetta Mole M.D.   On: 08/12/2021 11:01        Scheduled Meds:  amiodarone  200 mg Oral Daily   apixaban  5 mg Oral Q12H   atorvastatin  40 mg Oral QHS   cefadroxil  500 mg Oral BID   Chlorhexidine Gluconate Cloth  6 each Topical Q0600   furosemide  40 mg Intravenous Daily   gabapentin  300 mg Oral QHS   insulin aspart  0-9 Units Subcutaneous TID WC   methocarbamol  500 mg Oral Q12H   midodrine  10 mg Oral TID WC    morphine injection  2 mg Intravenous Once   mupirocin ointment  1 application Nasal BID   Continuous Infusions:   LOS: 1 day     Cordelia Poche, MD [facility] 08/13/2021, 4:44 PM  If 7PM-7AM, please contact night-coverage [email or web address]

## 2021-08-14 ENCOUNTER — Inpatient Hospital Stay (HOSPITAL_COMMUNITY): Payer: Medicare HMO

## 2021-08-14 DIAGNOSIS — J90 Pleural effusion, not elsewhere classified: Secondary | ICD-10-CM | POA: Diagnosis not present

## 2021-08-14 DIAGNOSIS — I480 Paroxysmal atrial fibrillation: Secondary | ICD-10-CM | POA: Diagnosis not present

## 2021-08-14 DIAGNOSIS — I5023 Acute on chronic systolic (congestive) heart failure: Secondary | ICD-10-CM | POA: Diagnosis not present

## 2021-08-14 DIAGNOSIS — N179 Acute kidney failure, unspecified: Secondary | ICD-10-CM | POA: Diagnosis not present

## 2021-08-14 LAB — URINALYSIS, COMPLETE (UACMP) WITH MICROSCOPIC
Bilirubin Urine: NEGATIVE
Glucose, UA: NEGATIVE mg/dL
Ketones, ur: NEGATIVE mg/dL
Nitrite: NEGATIVE
Protein, ur: 100 mg/dL — AB
RBC / HPF: >50 RBC/hpf — ABNORMAL HIGH (ref 0–5)
Specific Gravity, Urine: 1.019 (ref 1.005–1.030)
WBC, UA: >50 WBC/hpf — ABNORMAL HIGH (ref 0–5)
pH: 5 (ref 5.0–8.0)

## 2021-08-14 LAB — BASIC METABOLIC PANEL
Anion gap: 8 (ref 5–15)
BUN: 45 mg/dL — ABNORMAL HIGH (ref 8–23)
CO2: 19 mmol/L — ABNORMAL LOW (ref 22–32)
Calcium: 8 mg/dL — ABNORMAL LOW (ref 8.9–10.3)
Chloride: 108 mmol/L (ref 98–111)
Creatinine, Ser: 2.32 mg/dL — ABNORMAL HIGH (ref 0.61–1.24)
GFR, Estimated: 28 mL/min — ABNORMAL LOW (ref >60)
Glucose, Bld: 109 mg/dL — ABNORMAL HIGH (ref 70–99)
Potassium: 4.9 mmol/L (ref 3.5–5.1)
Sodium: 135 mmol/L (ref 135–145)

## 2021-08-14 LAB — CBC
HCT: 32.8 % — ABNORMAL LOW (ref 39.0–52.0)
Hemoglobin: 10.1 g/dL — ABNORMAL LOW (ref 13.0–17.0)
MCH: 22.3 pg — ABNORMAL LOW (ref 26.0–34.0)
MCHC: 30.8 g/dL (ref 30.0–36.0)
MCV: 72.4 fL — ABNORMAL LOW (ref 80.0–100.0)
Platelets: 205 10*3/uL (ref 150–400)
RBC: 4.53 MIL/uL (ref 4.22–5.81)
RDW: 27.3 % — ABNORMAL HIGH (ref 11.5–15.5)
WBC: 8 10*3/uL (ref 4.0–10.5)
nRBC: 0 % (ref 0.0–0.2)

## 2021-08-14 LAB — GLUCOSE, CAPILLARY
Glucose-Capillary: 82 mg/dL (ref 70–99)
Glucose-Capillary: 98 mg/dL (ref 70–99)
Glucose-Capillary: 101 mg/dL — ABNORMAL HIGH (ref 70–99)

## 2021-08-14 LAB — OSMOLALITY, URINE: Osmolality, Ur: 397 mOsm/kg (ref 300–900)

## 2021-08-14 LAB — SODIUM, URINE, RANDOM: Sodium, Ur: 17 mmol/L

## 2021-08-14 LAB — CREATININE, URINE, RANDOM: Creatinine, Urine: 125.34 mg/dL

## 2021-08-14 NOTE — Progress Notes (Signed)
Patient ID: Ryan [name], male   DOB: [birth year], 75 y.o.   MRN: [id number] Per order of Dr. Moshe Cipro, pt's capped left PCN was removed in its entirety without immediate complications. Gauze dressing applied to site. Nurse aware.

## 2021-08-14 NOTE — Progress Notes (Signed)
Physical Therapy Treatment Patient Details Name: Ryan [name] MRN: [id number] DOB: Feb 18, 1946 Today's Date: 08/14/2021   History of Present Illness 75 y.o. male admitted with AKI. s/p nephrostomy tube exchange 08/07/21.  PMH: HFrEF (25%), HTN, DM2, PVD s/p B AKAs.  DVT and PE in Aug, A.Fib, pt on eliquis    PT Comments    Pt is lethargic, eyes closed most of session but does respond verbally to questions (oriented to self and to month/year, not to location). Can follow commands. Max assist for bed mobility. Pt sat EOB x 10 minutes with. Worked on posture and sitting balance.     Recommendations for follow up therapy are one component of a multi-disciplinary discharge planning process, led by the attending physician.  Recommendations may be updated based on patient status, additional functional criteria and insurance authorization.  Follow Up Recommendations  Skilled nursing-short term rehab (<3 hours/day)     Assistance Recommended at Discharge    Equipment Recommendations  None recommended by PT    Recommendations for Other Services       Precautions / Restrictions Precautions Precautions: Fall Precaution Comments: B AKA Restrictions Weight Bearing Restrictions: No     Mobility  Bed Mobility Overal bed mobility: Needs Assistance Bed Mobility: Rolling   Sidelying to sit: Total assist   Sit to supine: Max assist   General bed mobility comments: assist to raise trunk and to guide hands onto bedrail, use of bedrail to pull up, pt 20%. Pt sat EOB x 10 minutes, initially requiring Mod A for balance, then supervision with single UE support.    Transfers                   General transfer comment: NT- 2* lethargy    Ambulation/Gait                   Stairs             Wheelchair Mobility    Modified Rankin (Stroke Patients Only)       Balance Overall balance assessment: Needs assistance Sitting-balance support: Single extremity  supported;Bilateral upper extremity supported Sitting balance-Leahy Scale: Poor Sitting balance - Comments: poor, requires BUE support initially then single UE support, sat 10 minutes, VCs to lift head and open eyes, able to reach forward with RUE                                    Cognition Arousal/Alertness: Lethargic Behavior During Therapy: WFL for tasks assessed/performed Overall Cognitive Status: No family/caregiver present to determine baseline cognitive functioning                                 General Comments: eyes closed most of session but does respond verbally to questions and can follow commands, briefly opened eyes when asked to do so        Exercises      General Comments        Pertinent Vitals/Pain Pain Score: 0-No pain    Home Living                          Prior Function            PT Goals (current goals can now be found in the care plan section) Acute Rehab PT Goals  Patient Stated Goal: back to rehab PT Goal Formulation: With patient Time For Goal Achievement: 08/25/21 Potential to Achieve Goals: Good Progress towards PT goals: Progressing toward goals    Frequency    Min 2X/week      PT Plan Current plan remains appropriate    Co-evaluation              AM-PAC PT "6 Clicks" Mobility   Outcome Measure  Help needed turning from your back to your side while in a flat bed without using bedrails?: Total Help needed moving from lying on your back to sitting on the side of a flat bed without using bedrails?: Total Help needed moving to and from a bed to a chair (including a wheelchair)?: Total Help needed standing up from a chair using your arms (e.g., wheelchair or bedside chair)?: Total Help needed to walk in hospital room?: Total Help needed climbing 3-5 steps with a railing? : Total 6 Click Score: 6    End of Session   Activity Tolerance: Patient tolerated treatment well;No increased  pain Patient left: with call bell/phone within reach;in bed;Other (comment) (ultrasound tech in room) Nurse Communication: Mobility status PT Visit Diagnosis: Other abnormalities of gait and mobility (R26.89);Muscle weakness (generalized) (M62.81)     Time: 1594-5859 PT Time Calculation (min) (ACUTE ONLY): 18 min  Charges:  $Therapeutic Activity: 8-22 mins                    Blondell Reveal Kistler PT 08/14/2021  Acute Rehabilitation Services Pager [phone] Office [phone]

## 2021-08-14 NOTE — Progress Notes (Signed)
PROGRESS NOTE    Ryan [name]  MPN:[id number] DOB: Jan 26, 1946 DOA: 08/05/2021 PCP: [name], MD   Brief Narrative: Ryan [name] is a 75 y.o. male with a history of HFrEF and EF of 25% status post ICD, hypertension, diabetes mellitus type 2, peripheral artery disease, bilateral AKA's, DVT/PE, A. fib on Eliquis, CABG.  Patient presented secondary to dislodged nephrostomy tube, status postexchange.  Patient also noted to have evidence of volume overload and resultant AKI.  Patient started on IV diuresis and now with decreased urine output.   Assessment & Plan:   * AKI (acute kidney injury) ([place]) Present on admission with concern for possible dehydration. AKI worsened during admission. Renal ultrasound significant for no hydronephrosis. CT imaging significant for anasarca. Lasix IV restarted. -Lasix as mentioned above  Acute on chronic systolic CHF (congestive heart failure) (HCC) EF of 25-30%. AICD in place. Evidence of overload this admission likely related to IV fluids. Lasix IV diuresis started. Inaccurate in/out. BNP >4500. Patient with undocumented urine occurrences and no documented output from left nephrostomy overnight -Hold Lasix 40 mg IV daily pending renal ultrasound and workup for oliguria -Strict in/out, daily weights  Elevated transaminase level Likely related to acute heart failure. No symptoms concerning for biliary pathology. -CMP in AM  Bilateral pleural effusion Secondary to volume overload in setting of heart failure. On room air.  S/P AKA (above knee amputation) bilateral (HCC) Noted.  Dyslipidemia -Continue Lipitor  Chronic hypotension Blood pressure well controlled currently. On midodrine. -Continue midodrine  Malfunction of nephrostomy tube (HCC) Dislodged prior to admission. Left percutaneous nephrostomy tube exchanged on 11/18. Outpatient follow-up with IR. Now, does not appear to have urine output from tube. Documentation of purulent  appearing discharge from ostomy site noted on 11/24. Afebrile. 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  Type 2 diabetes mellitus ([place]) Patient is on Humalog as an outpatient. -Continue SSI  AF (paroxysmal atrial fibrillation) (HCC) -Continue Eliquis and amiodarone  Abdominal pain-resolved as of 08/12/2021 Likely GI related. EKG does not appear to be suggestive of ACS. Troponin obtained and is stable. Location of pain is not near chest. Abdominal x-ray without acute process. Symptoms improved with Maalox. Now resolved.    DVT prophylaxis: Eliquis Code Status:   Code Status: Full Code Family Communication: None at bedside Disposition Plan: Discharge to SNF pending bed availability in addition to improvement of pain/transition to oral Lasix   Consultants:  None  Procedures:  NEPHROSTOMY TUBE EXCHANGE (08/07/2021)  Antimicrobials: None    Subjective: No concerns overnight. Ready to discharge from the hospital. Per nursing, purulent appearing discharge from left nephrostomy site.  Objective: Vitals:   08/13/21 1418 08/13/21 2102 08/14/21 0401 08/14/21 0451  BP: 118/71 113/73  (!) 96/53  Pulse: 63 78  63  Resp: 18 18  18   Temp: 98 F (36.7 C) 97.9 F (36.6 C)  98.4 F (36.9 C)  TempSrc:  Oral  Oral  SpO2: 100% 96%  99%  Weight:   65.3 kg     Intake/Output Summary (Last 24 hours) at 08/14/2021 1006 Last data filed at 08/14/2021 0800 Gross per 24 hour  Intake 360 ml  Output --  Net 360 ml    Filed Weights   08/14/21 0401  Weight: 65.3 kg    Examination:  General exam: Appears calm and comfortable Respiratory system: Clear to  auscultation. Respiratory effort normal. Cardiovascular system: S1 & S2 heard, RRR. No murmurs, rubs, gallops or clicks. Gastrointestinal system: Abdomen is nondistended, soft  and nontender. No organomegaly or masses felt. Normal bowel sounds heard. Left nephrostomy tube site with no active drainage Central nervous system: Alert and oriented. No focal neurological deficits. Musculoskeletal: No edema. Bilateral AKA Skin: No cyanosis. No rashes Psychiatry: Judgement and insight appear normal. Mood & affect appropriate.    Data Reviewed: I have personally reviewed following labs and imaging studies  CBC Lab Results  Component Value Date   WBC 8.1 08/11/2021   RBC 4.35 08/11/2021   HGB 9.7 (L) 08/11/2021   HCT 31.6 (L) 08/11/2021   MCV 72.6 (L) 08/11/2021   MCH 22.3 (L) 08/11/2021   PLT PLATELET CLUMPS NOTED ON SMEAR, UNABLE TO ESTIMATE 08/11/2021   MCHC 30.7 08/11/2021   RDW 27.9 (H) 08/11/2021   LYMPHSABS 1.1 08/11/2021   MONOABS 0.8 08/11/2021   EOSABS 0.2 08/11/2021   BASOSABS 0.1 35/36/1443     Last metabolic panel Lab Results  Component Value Date   NA 138 08/13/2021   K 5.0 08/13/2021   CL 109 08/13/2021   CO2 20 (L) 08/13/2021   BUN 45 (H) 08/13/2021   CREATININE 1.95 (H) 08/13/2021   GLUCOSE 81 08/13/2021   GFRNONAA 35 (L) 08/13/2021   GFRAA 39 (L) 10/01/2020   CALCIUM 8.2 (L) 08/13/2021   PHOS 3.0 08/10/2021   PROT 6.1 (L) 08/13/2021   ALBUMIN 2.3 (L) 08/13/2021   LABGLOB 2.2 03/09/2021   BILITOT 1.0 08/13/2021   ALKPHOS 143 (H) 08/13/2021   AST 273 (H) 08/13/2021   ALT 123 (H) 08/13/2021   ANIONGAP 9 08/13/2021    CBG (last 3)  Recent Labs    08/13/21 1616 08/13/21 2205 08/14/21 0744  GLUCAP 83 104* 82      GFR: Estimated Creatinine Clearance: 29.3 mL/min (A) (by C-G formula based on SCr of 1.95 mg/dL (H)).  Coagulation Profile: Recent Labs  Lab 08/08/21 0508  INR 3.5*     Recent Results (from the past 240 hour(s))  Resp Panel by RT-PCR (Flu A&B, Covid) Nasopharyngeal Swab     Status: None   Collection Time: 07/27/2021 10:14 PM   Specimen: Nasopharyngeal Swab; Nasopharyngeal(NP) swabs in vial transport medium   Result Value Ref Range Status   SARS Coronavirus 2 by RT PCR NEGATIVE NEGATIVE Final    Comment: (NOTE) SARS-CoV-2 target nucleic acids are NOT DETECTED.  The SARS-CoV-2 RNA is generally detectable in upper respiratory specimens during the acute phase of infection. The lowest concentration of SARS-CoV-2 viral copies this assay can detect is 138 copies/mL. A negative result does not preclude SARS-Cov-2 infection and should not be used as the sole basis for treatment or other patient management decisions. A negative result may occur with  improper specimen collection/handling, submission of specimen other than nasopharyngeal swab, presence of viral mutation(s) within the areas targeted by this assay, and inadequate number of viral copies(<138 copies/mL). A negative result must be combined with clinical observations, patient history, and epidemiological information. The expected result is Negative.  Fact Sheet for Patients:  EntrepreneurPulse.com.au  Fact Sheet for Healthcare Providers:  IncredibleEmployment.be  This test is no t yet approved or cleared by the [place] FDA and  has been authorized for detection and/or diagnosis of SARS-CoV-2 by FDA under an Emergency Use Authorization (EUA). This EUA will remain  in effect (meaning this test can be used) for the duration of the COVID-19  declaration under Section 564(b)(1) of the Act, 21 U.S.C.section 360bbb-3(b)(1), unless the authorization is terminated  or revoked sooner.       Influenza A by PCR NEGATIVE NEGATIVE Final   Influenza B by PCR NEGATIVE NEGATIVE Final    Comment: (NOTE) The Xpert Xpress SARS-CoV-2/FLU/RSV plus assay is intended as an aid in the diagnosis of influenza from Nasopharyngeal swab specimens and should not be used as a sole basis for treatment. Nasal washings and aspirates are unacceptable for Xpert Xpress SARS-CoV-2/FLU/RSV testing.  Fact Sheet for  Patients: EntrepreneurPulse.com.au  Fact Sheet for Healthcare Providers: IncredibleEmployment.be  This test is not yet approved or cleared by the [place] FDA and has been authorized for detection and/or diagnosis of SARS-CoV-2 by FDA under an Emergency Use Authorization (EUA). This EUA will remain in effect (meaning this test can be used) for the duration of the COVID-19 declaration under Section 564(b)(1) of the Act, 21 U.S.C. section 360bbb-3(b)(1), unless the authorization is terminated or revoked.  Performed at [facility], [place] [address] [place], [place] [address]   MRSA Next Gen by PCR, Nasal     Status: Abnormal   Collection Time: 08/08/21  3:06 PM   Specimen: Nasal Mucosa; Nasal Swab  Result Value Ref Range Status   MRSA by PCR Next Gen DETECTED (A) NOT DETECTED Final    Comment: (NOTE) The GeneXpert MRSA Assay (FDA approved for NASAL specimens only), is one component of a comprehensive MRSA colonization surveillance program. It is not intended to diagnose MRSA infection nor to guide or monitor treatment for MRSA infections. Test performance is not FDA approved in patients less than 9 years old. Performed at [facility], [place] [address] [place],  [address]          Radiology Studies: DG Abd Portable 1V  Result Date: 08/12/2021 CLINICAL DATA:  Abdominal pain EXAM: PORTABLE ABDOMEN - 1 VIEW COMPARISON:  KUB 04/29/2021, CT abdomen/pelvis 1 day prior FINDINGS: There is a nonobstructive bowel gas pattern. A left-sided nephrostomy tube is seen coiled over the left hemiabdomen. There is no abnormal soft tissue calcification. There is no definite free intraperitoneal air, suboptimally evaluated given supine technique. There is no acute osseous abnormality. A cardiac device leads, median sternotomy wires, and cardiac valve prosthesis are noted in the chest. There are bilateral pleural  effusions with left basilar airspace disease, better assessed on the same day chest radiograph. IMPRESSION: Nonobstructive bowel gas pattern. No other acute finding in the abdomen. Electronically Signed   By: Valetta Mole M.D.   On: 08/12/2021 11:01        Scheduled Meds:  amiodarone  200 mg Oral Daily   apixaban  5 mg Oral Q12H   atorvastatin  40 mg Oral QHS   cefadroxil  500 mg Oral BID   Chlorhexidine Gluconate Cloth  6 each Topical Q0600   furosemide  40 mg Intravenous Daily   gabapentin  300 mg Oral QHS   insulin aspart  0-9 Units Subcutaneous TID WC   methocarbamol  500 mg Oral Q12H   midodrine  10 mg Oral TID WC    morphine injection  2 mg Intravenous Once   mupirocin ointment  1 application Nasal BID   Continuous Infusions:   LOS: 2 days     Cordelia Poche, MD [facility] 08/14/2021, 10:06 AM  If 7PM-7AM, please contact night-coverage [email or web address]

## 2021-08-14 NOTE — Consult Note (Signed)
[facility] Renal Consultation Note  Requesting MD: Nettey Indication for Consultation: A on CRF  HPI:  Ryan [name] is a 75 y.o. male with CHF- ischemic cardiomyopathy-  EF 25 % and ICD, DM, PAD-  s/p bilat AKA- s/p admit from 10/12- 07/14/21 for sepsis and AKI thought due to cellulitis /wounds of foot.  He also has a history of AKI in August of 2022 due to obstruction req nephrostomy tube for retroperitoneal hematoma-  he required one HD treatment during that admission but AKI resolved quickly after nephrostomy tube placed.  He had kept this tube in place but on 11/18 one of reasons coming to hospital was that the tube broke and crt which had been normal was 1.69-  underwent tube change on 11/18 but it was capped thinking that he might not need it anymore-  crt improved to 1.4 on 11/19 but since then has increased steadily -   today is 2.3 and that is reason for consult-  renal u/s on 11/20 -  no hydro-  CT on 11/22 no hydro-  also u/s today no hydro -  U/A on 11/19 hematuria- 100 of protein-  mod leuks but no wbc- urine culture pending.  Other issue is soft BP  on lasix daily  and midodrine-  is somnolent and difficult to get a history out of-  family says that is relatively new-  that he seems to have regressed since being in the hospital   Creat  Date/Time Value Ref Range Status  06/01/2016 02:14 PM 1.17 0.70 - 1.18 mg/dL Final    Comment:      For patients > or = 77 years of age: The upper reference limit for Creatinine is approximately 13% higher for people identified as African-American.      Creatinine, Ser  Date/Time Value Ref Range Status  08/14/2021 09:49 AM 2.32 (H) 0.61 - 1.24 mg/dL Final  08/13/2021 10:10 AM 1.95 (H) 0.61 - 1.24 mg/dL Final  08/12/2021 11:09 AM 1.78 (H) 0.61 - 1.24 mg/dL Final  08/11/2021 09:01 AM 1.56 (H) 0.61 - 1.24 mg/dL Final  08/10/2021 05:13 AM 1.51 (H) 0.61 - 1.24 mg/dL Final  08/09/2021 05:45 AM 1.67 (H) 0.61 - 1.24 mg/dL Final   08/08/2021 05:08 AM 1.42 (H) 0.61 - 1.24 mg/dL Final  08/07/2021 06:58 AM 1.52 (H) 0.61 - 1.24 mg/dL Final  07/24/2021 10:06 PM 1.69 (H) 0.61 - 1.24 mg/dL Final  07/12/2021 03:02 AM 0.93 0.61 - 1.24 mg/dL Final  07/11/2021 01:47 AM 1.04 0.61 - 1.24 mg/dL Final  07/10/2021 02:54 AM 1.04 0.61 - 1.24 mg/dL Final  07/09/2021 03:53 AM 1.16 0.61 - 1.24 mg/dL Final  07/08/2021 02:53 AM 1.14 0.61 - 1.24 mg/dL Final  07/02/2021 02:19 AM 1.68 (H) 0.61 - 1.24 mg/dL Final  07/01/2021 08:34 PM 1.60 (H) 0.61 - 1.24 mg/dL Final  07/01/2021 04:25 PM 1.79 (H) 0.61 - 1.24 mg/dL Final  06/06/2021 07:25 PM 1.16 0.61 - 1.24 mg/dL Final  05/13/2021 04:44 AM 1.23 0.61 - 1.24 mg/dL Final  05/10/2021 04:07 AM 1.13 0.61 - 1.24 mg/dL Final  05/08/2021 04:00 AM 1.29 (H) 0.61 - 1.24 mg/dL Final  05/06/2021 03:00 AM 1.57 (H) 0.61 - 1.24 mg/dL Final  05/05/2021 05:40 AM 1.58 (H) 0.61 - 1.24 mg/dL Final  05/04/2021 05:00 AM 1.45 (H) 0.61 - 1.24 mg/dL Final  05/03/2021 01:25 AM 1.28 (H) 0.61 - 1.24 mg/dL Final  05/02/2021 01:45 AM 1.35 (H) 0.61 - 1.24 mg/dL Final  05/01/2021 07:15 AM  1.43 (H) 0.61 - 1.24 mg/dL Final  04/30/2021 06:45 AM 1.22 0.61 - 1.24 mg/dL Final  04/29/2021 02:20 AM 1.37 (H) 0.61 - 1.24 mg/dL Final  04/28/2021 01:16 AM 1.36 (H) 0.61 - 1.24 mg/dL Final  04/27/2021 01:57 AM 1.40 (H) 0.61 - 1.24 mg/dL Final  04/26/2021 01:25 AM 1.39 (H) 0.61 - 1.24 mg/dL Final  04/25/2021 01:37 AM 1.24 0.61 - 1.24 mg/dL Final  04/24/2021 03:20 AM 1.41 (H) 0.61 - 1.24 mg/dL Final  04/23/2021 02:09 AM 2.01 (H) 0.61 - 1.24 mg/dL Final  04/22/2021 04:35 AM 2.83 (H) 0.61 - 1.24 mg/dL Final    Comment:    DELTA CHECK NOTED  04/21/2021 04:40 AM 5.07 (H) 0.61 - 1.24 mg/dL Final    Comment:    DELTA CHECK NOTED  04/20/2021 01:58 AM 5.63 (H) 0.61 - 1.24 mg/dL Final  04/19/2021 05:59 AM 5.12 (H) 0.61 - 1.24 mg/dL Final  04/19/2021 01:50 AM 4.82 (H) 0.61 - 1.24 mg/dL Final  04/18/2021 10:07 PM 4.64 (H) 0.61 - 1.24 mg/dL  Final  04/18/2021 08:19 PM 4.66 (H) 0.61 - 1.24 mg/dL Final  04/14/2021 02:16 AM 1.14 0.61 - 1.24 mg/dL Final  04/13/2021 11:13 AM 1.23 0.61 - 1.24 mg/dL Final  04/11/2021 04:51 AM 1.46 (H) 0.61 - 1.24 mg/dL Final  04/10/2021 03:56 AM 1.49 (H) 0.61 - 1.24 mg/dL Final  04/09/2021 01:39 AM 1.46 (H) 0.61 - 1.24 mg/dL Final  04/08/2021 08:26 AM 1.60 (H) 0.61 - 1.24 mg/dL Final  04/03/2021 11:18 AM 1.47 (H) 0.61 - 1.24 mg/dL Final  03/27/2021 11:23 AM 1.47 (H) 0.76 - 1.27 mg/dL Final  03/12/2021 01:23 AM 1.76 (H) 0.61 - 1.24 mg/dL Final  03/11/2021 01:07 AM 1.87 (H) 0.61 - 1.24 mg/dL Final     PMHx:   Past Medical History:  Diagnosis Date   Acute pericarditis 09/16/2012   Possible purulent pericarditis in setting of staph bacteremia   AICD (automatic cardioverter/defibrillator) present    Arthritis    Atrial fibrillation (HCC)    Cardiomyopathy, dilated (HCC)    CHF (congestive heart failure) ([place])    Diabetes mellitus without complication ([place])    DVT of left external iliac vein 04/2021 06/06/2021   Endocarditis    Hypertension    Peripheral vascular disease ([place])    Pulmonary emboli 04/2021 06/06/2021   PVC's (premature ventricular contractions)    SVT (supraventricular tachycardia)  long RP     Past Surgical History:  Procedure Laterality Date   ABDOMINAL AORTAGRAM N/A 10/03/2012   Procedure: ABDOMINAL Maxcine Ham;  Surgeon: [name], MD;  Location: [facility] CATH LAB;  Service: Cardiovascular;  Laterality: N/A;   ABDOMINAL AORTOGRAM W/LOWER EXTREMITY N/A 02/25/2021   Procedure: ABDOMINAL AORTOGRAM W/LOWER EXTREMITY;  Surgeon: Cherre Robins, MD;  Location: [place] CV LAB;  Service: Cardiovascular;  Laterality: N/A;   ABDOMINAL AORTOGRAM W/LOWER EXTREMITY N/A 04/13/2021   Procedure: ABDOMINAL AORTOGRAM W/LOWER EXTREMITY;  Surgeon: Waynetta Sandy, MD;  Location: [place] CV LAB;  Service: Cardiovascular;  Laterality: N/A;   AMPUTATION Right 05/12/2021   Procedure:  AMPUTATION ABOVE KNEE RIGHT;  Surgeon: Angelia Mould, MD;  Location: [place];  Service: Vascular;  Laterality: Right;   AMPUTATION Left 07/07/2021   Procedure: LEFT ABOVE KNEE AMPUTATION;  Surgeon: Broadus John, MD;  Location: [place];  Service: Vascular;  Laterality: Left;   APPLICATION OF WOUND VAC Right 04/09/2021   Procedure: APPLICATION OF WOUND VAC;  Surgeon: Cherre Robins, MD;  Location: [place];  Service: Vascular;  Laterality: Right;   BYPASS GRAFT FEMORAL-PERONEAL Right 03/06/2021   Procedure: RIGHT ABOVE KNEE POPLITEAL ARTERY-PERONEAL BYPASS;  Surgeon: Cherre Robins, MD;  Location: [place];  Service: Vascular;  Laterality: Right;   CORONARY ANGIOGRAM  09/21/2012   Procedure: CORONARY ANGIOGRAM;  Surgeon: [name], MD;  Location: [facility] CATH LAB;  Service: Cardiovascular;;   EP IMPLANTABLE DEVICE N/A 06/11/2016   Procedure: ICD Implant;  Surgeon: Will Meredith Leeds, MD;  Location: [place] CV LAB;  Service: Cardiovascular;  Laterality: N/A;   EXTREMITY WIRE/PIN REMOVAL  09/14/2012   Procedure: REMOVAL K-WIRE/PIN EXTREMITY;  Surgeon: [name], MD;  Location: [place];  Service: Orthopedics;  Laterality: Right;  Right Foot   I & D EXTREMITY  09/14/2012   Procedure: IRRIGATION AND DEBRIDEMENT EXTREMITY;  Surgeon: Tennis Must, MD;  Location: [place];  Service: Orthopedics;  Laterality: Right;   INTRAOPERATIVE TRANSESOPHAGEAL ECHOCARDIOGRAM  09/26/2012   Procedure: INTRAOPERATIVE TRANSESOPHAGEAL ECHOCARDIOGRAM;  Surgeon: [name], MD;  Location: [facility] OR;  Service: Open Heart Surgery;  Laterality: N/A;   IR FLUORO GUIDE CV LINE RIGHT  04/20/2021   IR NEPHROSTOMY EXCHANGE LEFT  06/07/2021   IR NEPHROSTOMY PLACEMENT LEFT  04/20/2021   IR NEPHROSTOMY TUBE CHANGE  08/07/2021   IR US GUIDE VASC ACCESS RIGHT  04/20/2021   MITRAL VALVE REPLACEMENT  09/26/2012   Procedure: MITRAL VALVE (MV) REPLACEMENT;  Surgeon: [name], MD;  Location: [place];  Service: Open Heart Surgery;   Laterality: N/A;   RIGHT HEART CATH N/A 08/29/2020   Procedure: RIGHT HEART CATH;  Surgeon: Larey Dresser, MD;  Location: [place] CV LAB;  Service: Cardiovascular;  Laterality: N/A;   RIGHT HEART CATHETERIZATION  09/21/2012   Procedure: RIGHT HEART CATH;  Surgeon: [name], MD;  Location: [facility] CATH LAB;  Service: Cardiovascular;;   RIGHT/LEFT HEART CATH AND CORONARY ANGIOGRAPHY N/A 01/30/2021   Procedure: RIGHT/LEFT HEART CATH AND CORONARY ANGIOGRAPHY;  Surgeon: Larey Dresser, MD;  Location: [place] CV LAB;  Service: Cardiovascular;  Laterality: N/A;   SVT ABLATION N/A 09/03/2020   Procedure: SVT ABLATION;  Surgeon: Constance Haw, MD;  Location: [place] CV LAB;  Service: Cardiovascular;  Laterality: N/A;   TEE WITHOUT CARDIOVERSION  09/18/2012   Procedure: TRANSESOPHAGEAL ECHOCARDIOGRAM (TEE);  Surgeon: [name], MD;  Location: [place] ENDOSCOPY;  Service: Cardiovascular;  Laterality: N/A;   WOUND DEBRIDEMENT Right 04/09/2021   Procedure: DEBRIDEMENT RIGHT HEEL WOUND AND PARTIAL FIRST TOE AMPUTATION AND SECOND TOE AMPUTATION. Application of Myriad skin substitute;  Surgeon: Cherre Robins, MD;  Location: [facility] OR;  Service: Vascular;  Laterality: Right;    Family Hx:  Family History  Problem Relation Age of Onset   Hypertension Mother    Hypertension Father     Social History:  reports that he has never smoked. He has never used smokeless tobacco. He reports that he does not drink alcohol and does not use drugs.  Allergies:  Allergies  Allergen Reactions   Geralyn Flash [Fish Allergy] Nausea And Vomiting    Medications: Prior to Admission medications   Medication Sig Start Date End Date Taking? Authorizing Provider  acetaminophen (TYLENOL) 325 MG tablet Take 650 mg by mouth every 6 (six) hours as needed for mild pain.   Yes [provider]  Amino Acids-Protein Hydrolys (FEEDING SUPPLEMENT, PRO-STAT SUGAR FREE 64,) LIQD Take 30 mLs by mouth 3 (three) times  daily. Take with 4 oz  juice   Yes [provider]  amiodarone (PACERONE) 200 MG tablet TAKE 1 TABLET BY MOUTH EVERY DAY Patient taking differently: Take 200 mg by mouth daily. 03/03/21  Yes Bhagat, Bhavinkumar, PA  apixaban (ELIQUIS) 5 MG TABS tablet Take 1 tablet (5 mg total) by mouth every 12 (twelve) hours. 05/22/21  Yes [place] Eric J, DO  atorvastatin (LIPITOR) 40 MG tablet Take 1 tablet (40 mg total) by mouth daily at 6 PM. Patient taking differently: Take 40 mg by mouth at bedtime. 10/06/12  Yes Timmothy Euler, MD  bisacodyl (DULCOLAX) 5 MG EC tablet Take 5 mg by mouth daily as needed (constipation).   Yes [provider]  docusate sodium (COLACE) 100 MG capsule Take 100 mg by mouth daily.   Yes [provider]  feeding supplement, GLUCERNA SHAKE, (GLUCERNA SHAKE) LIQD Take 237 mLs by mouth 3 (three) times daily between meals.   Yes [provider]  furosemide (LASIX) 20 MG tablet Take 1 tablet (20 mg total) by mouth every other day. 07/15/21  Yes Vann, Jessica U, DO  gabapentin (NEURONTIN) 300 MG capsule Take 300 mg by mouth at bedtime.   Yes [provider]  guaiFENesin (ROBITUSSIN) 100 MG/5ML liquid Take 10 mLs by mouth every 4 (four) hours as needed for cough.   Yes [provider]  insulin lispro (HUMALOG) 100 UNIT/ML injection Inject 0-10 Units into the skin 4 (four) times daily -  with meals and at bedtime. 0-59 notify MD 60-150 0 units 151-199 2 units 200-249 4 units 250-296 6 units 300-349 8 units 350-399 10 units 400+ 10 untis and notify MD   Yes [provider]  Menthol, Topical Analgesic, (BIOFREEZE) 4 % GEL Apply 1 application topically 2 (two) times daily.   Yes [provider]  methocarbamol (ROBAXIN) 500 MG tablet Take 500 mg by mouth every 12 (twelve) hours.   Yes [provider]  midodrine (PROAMATINE) 10 MG tablet Take 1 tablet (10 mg total) by mouth 3 (three) times daily with meals.  05/22/21  Yes [place] Eric J, DO  ondansetron (ZOFRAN) 4 MG tablet Take 4 mg by mouth every 6 (six) hours as needed for nausea or vomiting.   Yes [provider]  oxyCODONE (OXY IR/ROXICODONE) 5 MG immediate release tablet Take 1 tablet (5 mg total) by mouth every 6 (six) hours as needed for severe pain. 07/14/21  Yes Eulogio Bear U, DO  ciprofloxacin (CIPRO) 500 MG tablet Take 500 mg by mouth every 12 (twelve) hours. Patient not taking: Reported on 08/07/2021    [provider]  insulin aspart (NOVOLOG) 100 UNIT/ML injection Inject 0-6 Units into the skin 3 (three) times daily with meals. Patient not taking: Reported on 08/07/2021 07/14/21   Geradine Girt, DO    I have reviewed the patient's current medications.  Labs:  Results for orders placed or performed during the hospital encounter of 07/27/2021 (from the past 48 hour(s))  Glucose, capillary     Status: Abnormal   Collection Time: 08/12/21  4:06 PM  Result Value Ref Range   Glucose-Capillary 129 (H) 70 - 99 mg/dL    Comment: Glucose reference range applies only to samples taken after fasting for at least 8 hours.   Comment 1 Notify RN   Glucose, capillary     Status: None   Collection Time: 08/12/21  9:50 PM  Result Value Ref Range   Glucose-Capillary 76 70 - 99 mg/dL    Comment: Glucose reference  range applies only to samples taken after fasting for at least 8 hours.  Glucose, capillary     Status: Abnormal   Collection Time: 08/13/21  7:40 AM  Result Value Ref Range   Glucose-Capillary 64 (L) 70 - 99 mg/dL    Comment: Glucose reference range applies only to samples taken after fasting for at least 8 hours.  Glucose, capillary     Status: Abnormal   Collection Time: 08/13/21  8:02 AM  Result Value Ref Range   Glucose-Capillary 100 (H) 70 - 99 mg/dL    Comment: Glucose reference range applies only to samples taken after fasting for at least 8 hours.  Comprehensive metabolic panel     Status: Abnormal    Collection Time: 08/13/21 10:10 AM  Result Value Ref Range   Sodium 138 135 - 145 mmol/L   Potassium 5.0 3.5 - 5.1 mmol/L   Chloride 109 98 - 111 mmol/L   CO2 20 (L) 22 - 32 mmol/L   Glucose, Bld 81 70 - 99 mg/dL    Comment: Glucose reference range applies only to samples taken after fasting for at least 8 hours.   BUN 45 (H) 8 - 23 mg/dL   Creatinine, Ser 1.95 (H) 0.61 - 1.24 mg/dL   Calcium 8.2 (L) 8.9 - 10.3 mg/dL   Total Protein 6.1 (L) 6.5 - 8.1 g/dL   Albumin 2.3 (L) 3.5 - 5.0 g/dL   AST 273 (H) 15 - 41 U/L   ALT 123 (H) 0 - 44 U/L   Alkaline Phosphatase 143 (H) 38 - 126 U/L   Total Bilirubin 1.0 0.3 - 1.2 mg/dL   GFR, Estimated 35 (L) >60 mL/min    Comment: (NOTE) Calculated using the CKD-EPI Creatinine Equation (2021)    Anion gap 9 5 - 15    Comment: Performed at [facility], [place] [address], [place], [place] [address]  Brain natriuretic peptide     Status: Abnormal   Collection Time: 08/13/21 10:10 AM  Result Value Ref Range   B Natriuretic Peptide >4,500.0 (H) 0.0 - 100.0 pg/mL    Comment: Performed at [facility], [place] [address], [place],  [address]  Glucose, capillary     Status: None   Collection Time: 08/13/21 11:07 AM  Result Value Ref Range   Glucose-Capillary 83 70 - 99 mg/dL    Comment: Glucose reference range applies only to samples taken after fasting for at least 8 hours.  Glucose, capillary     Status: None   Collection Time: 08/13/21  4:16 PM  Result Value Ref Range   Glucose-Capillary 83 70 - 99 mg/dL    Comment: Glucose reference range applies only to samples taken after fasting for at least 8 hours.  Glucose, capillary     Status: Abnormal   Collection Time: 08/13/21 10:05 PM  Result Value Ref Range   Glucose-Capillary 104 (H) 70 - 99 mg/dL    Comment: Glucose reference range applies only to samples taken after fasting for at least 8 hours.  Glucose, capillary     Status: None   Collection Time:  08/14/21  7:44 AM  Result Value Ref Range   Glucose-Capillary 82 70 - 99 mg/dL    Comment: Glucose reference range applies only to samples taken after fasting for at least 8 hours.   Comment 1 Notify RN   Basic metabolic panel     Status: Abnormal   Collection Time: 08/14/21  9:49 AM  Result  Value Ref Range   Sodium 135 135 - 145 mmol/L   Potassium 4.9 3.5 - 5.1 mmol/L   Chloride 108 98 - 111 mmol/L   CO2 19 (L) 22 - 32 mmol/L   Glucose, Bld 109 (H) 70 - 99 mg/dL    Comment: Glucose reference range applies only to samples taken after fasting for at least 8 hours.   BUN 45 (H) 8 - 23 mg/dL   Creatinine, Ser 2.32 (H) 0.61 - 1.24 mg/dL   Calcium 8.0 (L) 8.9 - 10.3 mg/dL   GFR, Estimated 28 (L) >60 mL/min    Comment: (NOTE) Calculated using the CKD-EPI Creatinine Equation (2021)    Anion gap 8 5 - 15    Comment: Performed at [facility], [place] [address] [place], [place] [address]  CBC     Status: Abnormal   Collection Time: 08/14/21  9:49 AM  Result Value Ref Range   WBC 8.0 4.0 - 10.5 K/uL   RBC 4.53 4.22 - 5.81 MIL/uL   Hemoglobin 10.1 (L) 13.0 - 17.0 g/dL   HCT 32.8 (L) 39.0 - 52.0 %   MCV 72.4 (L) 80.0 - 100.0 fL   MCH 22.3 (L) 26.0 - 34.0 pg   MCHC 30.8 30.0 - 36.0 g/dL   RDW 27.3 (H) 11.5 - 15.5 %   Platelets 205 150 - 400 K/uL   nRBC 0.0 0.0 - 0.2 %    Comment: Performed at [facility], [place] [address] [place], [place] [address]  Glucose, capillary     Status: None   Collection Time: 08/14/21 12:25 PM  Result Value Ref Range   Glucose-Capillary 98 70 - 99 mg/dL    Comment: Glucose reference range applies only to samples taken after fasting for at least 8 hours.   Comment 1 Notify RN   Creatinine, urine, random     Status: None   Collection Time: 08/14/21 12:56 PM  Result Value Ref Range   Creatinine, Urine 125.34 mg/dL    Comment: Performed at [facility], [place] [address] [place], [place] [address]  Sodium,  urine, random     Status: None   Collection Time: 08/14/21 12:56 PM  Result Value Ref Range   Sodium, Ur 17 mmol/L    Comment: Performed at [facility], [place] [address], [place], [place] [address]     ROS:  Review of systems not obtained due to patient factors. He is not really complaining of anything but is somnolent   Physical Exam: Vitals:   08/14/21 0451 08/14/21 1254  BP: (!) 96/53 109/64  Pulse: 63 62  Resp: 18 20  Temp: 98.4 F (36.9 C) 97.7 F (36.5 C)  SpO2: 99% 95%     General: sleeping-  easily arouseable but not very talkative and does not stay awake  HEENT: PERRLA, EOMI, mucous membranes moist Neck: no obvious JVD Heart: RRR Lungs: poor effort -  dec BS at the bases Abdomen: soft, non tenderr Extremities: pitting edema to stumps Skin: warm and dry Neuro: somnolent but arousable and directable   Assessment/Plan: 75 year old BM with multiple medical issues including diffuse vasc disease with cardiomyopathy and a few episodes of AKI in the past-  3 mos ago req nephrostomy tube and one HD treatment-  now with recurrent AKI 1.Renal- recurrent AKI since normal renal function in October of 2022.   Several imaging studies have not indicated hydronephrosis so that does not appear to be  the issue.  What instead I am concerned about is possible UTI-  has been on duracef and urine culture is pending -  will resend.  Also with his low and low normal BPs for someone with diffuse vascular dz he may not be getting enough renal perfusion pressure-  he is not on any antihypertensives and he is on max dose midodrine-  he is , however, on neurontin, muscle relaxers and narcotics which can lower BP-  I stopped the neurontin and muscle relaxers since he is somnolent-  I kept the narcotic as was PRN-  There are not current dialysis indications-  I explained how dialysis would be difficult given his low BP and his constellation of other medical issues-  is tough because he  did get in the past and recovered so they dont see it as a bad thing.  I hope we will be able to get this to turn around  2. Hypertension/volume  - he is overloaded- pitting edema and pleural effusions.  Is not on a big dose of lasix but I will keep for now and watch his UOP as we will benefit from diuresis 3. Anemia  - mod-  in the setting of frequent hosp and procedures-  not below a level to treat just yet 4. Nephrostomy tube-  It does not seem that he needs any more-  I have written an order to remove    Louis Meckel 08/14/2021, 2:02 PM

## 2021-08-15 ENCOUNTER — Inpatient Hospital Stay (HOSPITAL_COMMUNITY): Payer: Medicare HMO

## 2021-08-15 DIAGNOSIS — N179 Acute kidney failure, unspecified: Secondary | ICD-10-CM | POA: Diagnosis not present

## 2021-08-15 DIAGNOSIS — I5023 Acute on chronic systolic (congestive) heart failure: Secondary | ICD-10-CM | POA: Diagnosis not present

## 2021-08-15 DIAGNOSIS — I480 Paroxysmal atrial fibrillation: Secondary | ICD-10-CM | POA: Diagnosis not present

## 2021-08-15 DIAGNOSIS — J90 Pleural effusion, not elsewhere classified: Secondary | ICD-10-CM | POA: Diagnosis not present

## 2021-08-15 LAB — HEPATIC FUNCTION PANEL
ALT: 203 U/L — ABNORMAL HIGH (ref 0–44)
AST: 442 U/L — ABNORMAL HIGH (ref 15–41)
Albumin: 2.3 g/dL — ABNORMAL LOW (ref 3.5–5.0)
Alkaline Phosphatase: 163 U/L — ABNORMAL HIGH (ref 38–126)
Bilirubin, Direct: 0.5 mg/dL — ABNORMAL HIGH (ref 0.0–0.2)
Indirect Bilirubin: 0.7 mg/dL (ref 0.3–0.9)
Total Bilirubin: 1.2 mg/dL (ref 0.3–1.2)
Total Protein: 6 g/dL — ABNORMAL LOW (ref 6.5–8.1)

## 2021-08-15 LAB — URINE CULTURE: Culture: NO GROWTH

## 2021-08-15 LAB — BASIC METABOLIC PANEL
Anion gap: 11 (ref 5–15)
BUN: 49 mg/dL — ABNORMAL HIGH (ref 8–23)
CO2: 18 mmol/L — ABNORMAL LOW (ref 22–32)
Calcium: 8.1 mg/dL — ABNORMAL LOW (ref 8.9–10.3)
Chloride: 108 mmol/L (ref 98–111)
Creatinine, Ser: 2.66 mg/dL — ABNORMAL HIGH (ref 0.61–1.24)
GFR, Estimated: 24 mL/min — ABNORMAL LOW (ref >60)
Glucose, Bld: 80 mg/dL (ref 70–99)
Potassium: 5.3 mmol/L — ABNORMAL HIGH (ref 3.5–5.1)
Sodium: 137 mmol/L (ref 135–145)

## 2021-08-15 LAB — GLUCOSE, CAPILLARY
Glucose-Capillary: 86 mg/dL (ref 70–99)
Glucose-Capillary: 104 mg/dL — ABNORMAL HIGH (ref 70–99)
Glucose-Capillary: 111 mg/dL — ABNORMAL HIGH (ref 70–99)
Glucose-Capillary: 88 mg/dL (ref 70–99)

## 2021-08-15 MED ORDER — SODIUM ZIRCONIUM CYCLOSILICATE 10 G PO PACK
10.0000 g | PACK | Freq: Once | ORAL | Status: AC
  Administered 2021-08-15: 10 g via ORAL
  Filled 2021-08-15: qty 1

## 2021-08-15 MED ORDER — FUROSEMIDE 10 MG/ML IJ SOLN
80.0000 mg | Freq: Every day | INTRAMUSCULAR | Status: DC
Start: 2021-08-16 — End: 2021-08-16
  Filled 2021-08-15: qty 8

## 2021-08-15 MED ORDER — SODIUM CHLORIDE 0.9 % IV SOLN
2.0000 g | INTRAVENOUS | Status: DC
  Administered 2021-08-15 – 2021-08-18 (×4): 2 g via INTRAVENOUS
  Filled 2021-08-15 (×5): qty 2

## 2021-08-15 MED ORDER — SODIUM BICARBONATE 650 MG PO TABS
650.0000 mg | ORAL_TABLET | Freq: Two times a day (BID) | ORAL | Status: DC
  Administered 2021-08-15 – 2021-08-18 (×7): 650 mg via ORAL
  Filled 2021-08-15 (×7): qty 1

## 2021-08-15 NOTE — Plan of Care (Signed)
  Problem: Nutrition: Goal: Adequate nutrition will be maintained Outcome: Progressing   Problem: Coping: Goal: Level of anxiety will decrease Outcome: Progressing   Problem: Elimination: Goal: Will not experience complications related to bowel motility Outcome: Progressing Goal: Will not experience complications related to urinary retention Outcome: Progressing   Problem: Education: Goal: Knowledge of General Education information will improve Description: Including pain rating scale, medication(s)/side effects and non-pharmacologic comfort measures Outcome: Progressing   Problem: Health Behavior/Discharge Planning: Goal: Ability to manage health-related needs will improve Outcome: Progressing   Problem: Clinical Measurements: Goal: Ability to maintain clinical measurements within normal limits will improve Outcome: Progressing Goal: Will remain free from infection Outcome: Progressing Goal: Diagnostic test results will improve Outcome: Progressing Goal: Respiratory complications will improve Outcome: Progressing Goal: Cardiovascular complication will be avoided Outcome: Progressing   Problem: Activity: Goal: Risk for activity intolerance will decrease Outcome: Progressing   Problem: Nutrition: Goal: Adequate nutrition will be maintained Outcome: Progressing   Problem: Coping: Goal: Level of anxiety will decrease Outcome: Progressing   Problem: Elimination: Goal: Will not experience complications related to bowel motility Outcome: Progressing Goal: Will not experience complications related to urinary retention Outcome: Progressing   Problem: Pain Managment: Goal: General experience of comfort will improve Outcome: Progressing   Problem: Safety: Goal: Ability to remain free from injury will improve Outcome: Progressing   Problem: Skin Integrity: Goal: Risk for impaired skin integrity will decrease Outcome: Progressing

## 2021-08-15 NOTE — Progress Notes (Signed)
Subjective:  still pretty somnolent- UOP remains low but not zero-  crt worse-  LFTs worse as well-  most recent urine definitely looks like UTI   Objective Vital signs in last 24 hours: Vitals:   08/15/21 0209 08/15/21 0300 08/15/21 0341 08/15/21 0910  BP:   (!) 102/57 103/63  Pulse:   69 64  Resp:   18 18  Temp:   98.3 F (36.8 C) 98.2 F (36.8 C)  TempSrc:   Oral   SpO2:   91% 95%  Weight:  64.4 kg    Height: 4' (1.219 m)      Weight change: -0.9 kg  Intake/Output Summary (Last 24 hours) at 08/15/2021 1257 Last data filed at 08/15/2021 0834 Gross per 24 hour  Intake --  Output 75 ml  Net -75 ml     Assessment/Plan: 75 year old BM with multiple medical issues including diffuse vasc disease with cardiomyopathy and a few episodes of AKI in the past-  3 mos ago req nephrostomy tube and one HD treatment-  now with recurrent AKI 1.Renal- recurrent AKI since normal renal function in October of 2022.   Several imaging studies have not indicated hydronephrosis so that does not appear to be the issue.  What instead I am concerned about is possible UTI/ urosepsis even-  has been on duracef and urine culture is pending but most recent urine looks like UTI.  Also with his low and low normal BPs for someone with diffuse vascular dz he may not be getting enough renal perfusion pressure-  he is not on any antihypertensives and he is on max dose midodrine-  he was , however, on neurontin, muscle relaxers and narcotics which can lower BP-  I stopped the neurontin and muscle relaxers-  I kept the narcotic as was PRN-  There are not current dialysis indications-  I explained how dialysis would be difficult given his low BP and his constellation of other medical issues-  is tough because he did get in the past and recovered so they dont see it as a bad thing.  I hope we will be able to get this to turn around.  Still heading in the wrong direction but no dialysis indications 2. Hypertension/volume  - he  is overloaded- pitting edema and pleural effusions. Will try to increase lasix and see if he responds 3. Anemia  - mod-  in the setting of frequent hosp and procedures-  not below a level to treat just yet 4. Nephrostomy tube-  It does not seem that he needs any more-  I have written an order to remove  5. UTI-  happened when he was on duracef- will broaden to cefepime 6. Hyperkalemia-  given one dose lokelma 7. Metabolic acidosis-  will give him sodium bicarb as well     Louis Meckel    Labs: Basic Metabolic Panel: Recent Labs  Lab 08/10/21 0513 08/11/21 0901 08/13/21 1010 08/14/21 0949 08/15/21 0857  NA 136   < > 138 135 137  K 4.8   < > 5.0 4.9 5.3*  CL 109   < > 109 108 108  CO2 18*   < > 20* 19* 18*  GLUCOSE 83   < > 81 109* 80  BUN 41*   < > 45* 45* 49*  CREATININE 1.51*   < > 1.95* 2.32* 2.66*  CALCIUM 8.2*   < > 8.2* 8.0* 8.1*  PHOS 3.0  --   --   --   --    < > =  values in this interval not displayed.   Liver Function Tests: Recent Labs  Lab 08/12/21 1109 08/13/21 1010 08/15/21 0857  AST 95* 273* 442*  ALT 60* 123* 203*  ALKPHOS 133* 143* 163*  BILITOT 1.0 1.0 1.2  PROT 6.0* 6.1* 6.0*  ALBUMIN 2.4* 2.3* 2.3*   Recent Labs  Lab 08/12/21 1109  LIPASE 23   No results for input(s): AMMONIA in the last 168 hours. CBC: Recent Labs  Lab 08/09/21 0545 08/10/21 0513 08/11/21 0901 08/14/21 0949  WBC 8.6 8.4 8.1 8.0  NEUTROABS  --  6.0 5.8  --   HGB 9.0* 9.0* 9.7* 10.1*  HCT 29.1* 29.1* 31.6* 32.8*  MCV 72.2* 73.7* 72.6* 72.4*  PLT 208 207 PLATELET CLUMPS NOTED ON SMEAR, UNABLE TO ESTIMATE 205   Cardiac Enzymes: No results for input(s): CKTOTAL, CKMB, CKMBINDEX, TROPONINI in the last 168 hours. CBG: Recent Labs  Lab 08/14/21 0744 08/14/21 1225 08/14/21 1651 08/15/21 0739 08/15/21 1152  GLUCAP 82 98 101* 86 104*    Iron Studies: No results for input(s): IRON, TIBC, TRANSFERRIN, FERRITIN in the last 72 hours. Studies/Results: US  RENAL  Result Date: 08/14/2021 CLINICAL DATA:  Oliguria EXAM: RENAL / URINARY TRACT ULTRASOUND COMPLETE COMPARISON:  CT 08/11/2021.  Ultrasound 08/09/2021. FINDINGS: Right Kidney: Renal measurements: 10.0 x 3.5 x 6.2 cm = volume: 112 mL. Echogenicity within normal limits. No mass or hydronephrosis visualized. Left Kidney: Renal measurements: 10.5 x 4.4 x 3.5 cm = volume: 83 mL. Left nephrostomy catheter in place. No hydronephrosis. No mass. Bladder: Layering debris noted in the bladder. Other: Mild ascites and left pleural effusion noted. IMPRESSION: Left nephrostomy catheter in place.  No hydronephrosis. Mild ascites and left pleural effusion. Electronically Signed   By: Rolm Baptise M.D.   On: 08/14/2021 10:47   DG CHEST PORT 1 VIEW  Result Date: 08/15/2021 CLINICAL DATA:  Hypoxia. History of CHF, diabetes, hypertension, endocarditis. EXAM: PORTABLE CHEST 1 VIEW COMPARISON:  Chest radiograph 08/12/2021 FINDINGS: Left chest wall AICD with leads overlying the right atrium and right ventricle. Postoperative changes of median sternotomy, CABG, and mitral valve replacement. Low lung volumes with diffuse patchy and interstitial airspace opacities, mildly increased compared to 08/12/2021. Moderate layering right pleural effusion with probable loculated lateral component. Small left pleural effusion. Thin linear lucency noted at the right apex measures 0.4 cm, raising suspicion for possible pneumothorax. No findings of tension effect. Heart size is difficult to evaluate due to adjacent consolidations but appears enlarged, stable. Aortic calcifications. IMPRESSION: 1. Thin linear lucency at the right apex raises suspicion for possible pneumothorax without tension effect. Critical Value/emergent results were called by telephone at the time of interpretation on 08/15/2021 at 11:43 am to provider RALPH NETTEY , who verbally acknowledged these results. 2. Increased diffuse interstitial and patchy airspace opacities as  well as moderate right and small left pleural effusions, favored to represent worsening pulmonary edema though superimposed infection can not be excluded. 3.  Aortic Atherosclerosis (ICD10-I70.0). Electronically Signed   By: Ileana Roup M.D.   On: 08/15/2021 11:44   Medications: Infusions:   Scheduled Medications:  amiodarone  200 mg Oral Daily   apixaban  5 mg Oral Q12H   atorvastatin  40 mg Oral QHS   cefadroxil  500 mg Oral BID   Chlorhexidine Gluconate Cloth  6 each Topical Q0600   furosemide  40 mg Intravenous Daily   insulin aspart  0-9 Units Subcutaneous TID WC   midodrine  10 mg Oral TID WC  morphine injection  2 mg Intravenous Once   sodium zirconium cyclosilicate  10 g Oral Once    have reviewed scheduled and prn medications.  Physical Exam: General:  somnolent but arousable-  confused Heart: RRR Lungs: dec BS at bases Abdomen: soft, non tender Extremities: dep pitting edema    08/15/2021,12:57 PM  LOS: 3 days

## 2021-08-15 NOTE — Progress Notes (Signed)
Pharmacy Antibiotic Note  Ryan [name] is a 75 y.o. male admitted on 07/27/2021. Pharmacy has been consulted for cefepime dosing for UTI. Pt has been on cefadroxil since 11/21, antibiotics being broadened today.   Today, 08/15/21 Worsening renal function. SCr 2.66, CrCl ~13 mL/min  Plan: Cefepime 2 g IV q24h Monitor renal function for necessary dose adjustments  Height: 4' (121.9 cm) Weight: 64.4 kg (141 lb 15.6 oz) IBW/kg (Calculated) : 22.4  Temp (24hrs), Avg:98.3 F (36.8 C), Min:98.2 F (36.8 C), Max:98.4 F (36.9 C)  Recent Labs  Lab 08/09/21 0545 08/10/21 0513 08/11/21 0901 08/12/21 1109 08/13/21 1010 08/14/21 0949 08/15/21 0857  WBC 8.6 8.4 8.1  --   --  8.0  --   CREATININE 1.67* 1.51* 1.56* 1.78* 1.95* 2.32* 2.66*    Estimated Creatinine Clearance: 12.9 mL/min (A) (by C-G formula based on SCr of 2.66 mg/dL (H)).    Allergies  Allergen Reactions   Geralyn Flash [Fish Allergy] Nausea And Vomiting    Antimicrobials this admission: cefepime 11/26 >>  cefadroxil 11/21 >> 11/26  Dose adjustments this admission:  Microbiology results: 11/25 UCx: Sent   Thank you for allowing pharmacy to be a part of this patient's care.  Lenis Noon, PharmD 08/15/2021 2:06 PM

## 2021-08-15 NOTE — Progress Notes (Signed)
PROGRESS NOTE    Ryan [name]  QAS:[id number] DOB: 1945-11-18 DOA: 08/04/2021 PCP: [name], MD   Brief Narrative: Ryan [name] is a 75 y.o. male with a history of HFrEF and EF of 25% status post ICD, hypertension, diabetes mellitus type 2, peripheral artery disease, bilateral AKA's, DVT/PE, A. fib on Eliquis, CABG.  Patient presented secondary to dislodged nephrostomy tube, status postexchange.  Patient also noted to have evidence of volume overload and resultant AKI.  Patient started on IV diuresis and now with decreased urine output.   Assessment & Plan:   * AKI (acute kidney injury) ([place]) Present on admission with concern for possible dehydration. AKI worsened during admission. Renal ultrasound significant for no hydronephrosis. CT imaging significant for anasarca. Lasix IV restarted but patient has had minimal urine output. Nephrology consulted -Nephrology recommendations: pending today -Continue Lasix -Strict in/out  Acute on chronic systolic CHF (congestive heart failure) (HCC) EF of 25-30%. AICD in place. Evidence of overload this admission likely related to IV fluids. Lasix IV diuresis started. Inaccurate in/out. BNP >4500. Patient appears to be oliguric with minimal urine output documented. -Lasix as mentioned above -Strict in/out, daily weights  Elevated transaminase level Likely related to acute heart failure. No symptoms concerning for biliary pathology. AST/ALT trending up. -CMP in AM  Bilateral pleural effusion Secondary to volume overload in setting of heart failure. On room air.  S/P AKA (above knee amputation) bilateral (HCC) Noted.  Dyslipidemia -Continue Lipitor  Chronic hypotension Blood pressure well controlled currently. On midodrine. 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  Type 2 diabetes mellitus  ([place]) Patient is on Humalog as an outpatient. -Continue SSI  AF (paroxysmal atrial fibrillation) (HCC) -Continue Eliquis and amiodarone  Abdominal pain-resolved as of 08/12/2021 Likely GI related. EKG does not appear to be suggestive of ACS. Troponin obtained and is stable. Location of pain is not near chest. Abdominal x-ray without acute process. Symptoms improved with Maalox. Now resolved.  Malfunction of nephrostomy tube (HCC)-resolved as of 08/15/2021 Dislodged prior to admission. Left percutaneous nephrostomy tube exchanged on 11/18. Outpatient follow-up with IR. Now, does not appear to have urine output from tube. Documentation of purulent appearing discharge from ostomy site noted on 11/24. Afebrile. No recurrent drainage noted. Renal ultrasound without pathology noted. Nephrostomy tube removed on 11/25 per nephrology recommendations.    DVT prophylaxis: Eliquis Code Status:   Code Status: Full Code Family Communication: None at bedside Disposition Plan: Discharge to SNF pending bed availability in addition to improvement of pain/transition to oral Lasix   Consultants:  None  Procedures:  NEPHROSTOMY TUBE EXCHANGE (08/07/2021)  Antimicrobials: None    Subjective: No issues this morning. No dyspnea.  Objective: Vitals:   08/15/21 0209 08/15/21 0300 08/15/21 0341 08/15/21 0910  BP:   (!) 102/57 103/63  Pulse:   69 64  Resp:   18 18  Temp:   98.3 F (36.8 C) 98.2 F (36.8 C)  TempSrc:   Oral   SpO2:   91% 95%  Weight:  64.4 kg    Height: 4' (1.219 m)       Intake/Output Summary (Last 24 hours) at 08/15/2021 1023 Last data filed at 08/15/2021 0834 Gross per 24 hour  Intake 120 ml  Output 177 ml  Net -57 ml    Filed Weights   08/14/21 0401 08/15/21 0300  Weight: 65.3 kg 64.4 kg  Examination:  General exam: Appears calm and comfortable  Respiratory system: Diminished to anterior auscultation. Respiratory effort normal. Cardiovascular system: S1 & S2  heard, RRR. Gastrointestinal system: Abdomen is nondistended, soft and nontender. No organomegaly or masses felt. Normal bowel sounds heard. Central nervous system: Alert and oriented. No focal neurological deficits. Musculoskeletal: some pitting edema of bilateral stumps. S/p AKA bilaterally Skin: No cyanosis. No rashes Psychiatry: Judgement and insight appear normal. Mood & affect appropriate.    Data Reviewed: I have personally reviewed following labs and imaging studies  CBC Lab Results  Component Value Date   WBC 8.0 08/14/2021   RBC 4.53 08/14/2021   HGB 10.1 (L) 08/14/2021   HCT 32.8 (L) 08/14/2021   MCV 72.4 (L) 08/14/2021   MCH 22.3 (L) 08/14/2021   PLT 205 08/14/2021   MCHC 30.8 08/14/2021   RDW 27.3 (H) 08/14/2021   LYMPHSABS 1.1 08/11/2021   MONOABS 0.8 08/11/2021   EOSABS 0.2 08/11/2021   BASOSABS 0.1 77/82/4235     Last metabolic panel Lab Results  Component Value Date   NA 137 08/15/2021   K 5.3 (H) 08/15/2021   CL 108 08/15/2021   CO2 18 (L) 08/15/2021   BUN 49 (H) 08/15/2021   CREATININE 2.66 (H) 08/15/2021   GLUCOSE 80 08/15/2021   GFRNONAA 24 (L) 08/15/2021   GFRAA 39 (L) 10/01/2020   CALCIUM 8.1 (L) 08/15/2021   PHOS 3.0 08/10/2021   PROT 6.0 (L) 08/15/2021   ALBUMIN 2.3 (L) 08/15/2021   LABGLOB 2.2 03/09/2021   BILITOT 1.2 08/15/2021   ALKPHOS 163 (H) 08/15/2021   AST 442 (H) 08/15/2021   ALT 203 (H) 08/15/2021   ANIONGAP 11 08/15/2021    CBG (last 3)  Recent Labs    08/14/21 1225 08/14/21 1651 08/15/21 0739  GLUCAP 98 101* 86      GFR: Estimated Creatinine Clearance: 12.9 mL/min (A) (by C-G formula based on SCr of 2.66 mg/dL (H)).  Coagulation Profile: No results for input(s): INR, PROTIME in the last 168 hours.   Recent Results (from the past 240 hour(s))  Resp Panel by RT-PCR (Flu A&B, Covid) Nasopharyngeal Swab     Status: None   Collection Time: 07/28/2021 10:14 PM   Specimen: Nasopharyngeal Swab; Nasopharyngeal(NP)  swabs in vial transport medium  Result Value Ref Range Status   SARS Coronavirus 2 by RT PCR NEGATIVE NEGATIVE Final    Comment: (NOTE) SARS-CoV-2 target nucleic acids are NOT DETECTED.  The SARS-CoV-2 RNA is generally detectable in upper respiratory specimens during the acute phase of infection. The lowest concentration of SARS-CoV-2 viral copies this assay can detect is 138 copies/mL. A negative result does not preclude SARS-Cov-2 infection and should not be used as the sole basis for treatment or other patient management decisions. A negative result may occur with  improper specimen collection/handling, submission of specimen other than nasopharyngeal swab, presence of viral mutation(s) within the areas targeted by this assay, and inadequate number of viral copies(<138 copies/mL). A negative result must be combined with clinical observations, patient history, and epidemiological information. The expected result is Negative.  Fact Sheet for Patients:  EntrepreneurPulse.com.au  Fact Sheet for Healthcare Providers:  IncredibleEmployment.be  This test is no t yet approved or cleared by the [place] FDA and  has been authorized for detection and/or diagnosis of SARS-CoV-2 by FDA under an Emergency Use Authorization (EUA). This EUA will remain  in effect (meaning this test can be used) for the duration of the COVID-19 declaration  under Section 564(b)(1) of the Act, 21 U.S.C.section 360bbb-3(b)(1), unless the authorization is terminated  or revoked sooner.       Influenza A by PCR NEGATIVE NEGATIVE Final   Influenza B by PCR NEGATIVE NEGATIVE Final    Comment: (NOTE) The Xpert Xpress SARS-CoV-2/FLU/RSV plus assay is intended as an aid in the diagnosis of influenza from Nasopharyngeal swab specimens and should not be used as a sole basis for treatment. Nasal washings and aspirates are unacceptable for Xpert Xpress  SARS-CoV-2/FLU/RSV testing.  Fact Sheet for Patients: EntrepreneurPulse.com.au  Fact Sheet for Healthcare Providers: IncredibleEmployment.be  This test is not yet approved or cleared by the [place] FDA and has been authorized for detection and/or diagnosis of SARS-CoV-2 by FDA under an Emergency Use Authorization (EUA). This EUA will remain in effect (meaning this test can be used) for the duration of the COVID-19 declaration under Section 564(b)(1) of the Act, 21 U.S.C. section 360bbb-3(b)(1), unless the authorization is terminated or revoked.  Performed at [facility], [place] [address], [place], [place] [address]   MRSA Next Gen by PCR, Nasal     Status: Abnormal   Collection Time: 08/08/21  3:06 PM   Specimen: Nasal Mucosa; Nasal Swab  Result Value Ref Range Status   MRSA by PCR Next Gen DETECTED (A) NOT DETECTED Final    Comment: (NOTE) The GeneXpert MRSA Assay (FDA approved for NASAL specimens only), is one component of a comprehensive MRSA colonization surveillance program. It is not intended to diagnose MRSA infection nor to guide or monitor treatment for MRSA infections. Test performance is not FDA approved in patients less than 7 years old. Performed at [facility], [place] [address], [place], [place] [address]          Radiology Studies: US RENAL  Result Date: 08/14/2021 CLINICAL DATA:  Oliguria EXAM: RENAL / URINARY TRACT ULTRASOUND COMPLETE COMPARISON:  CT 08/11/2021.  Ultrasound 08/09/2021. FINDINGS: Right Kidney: Renal measurements: 10.0 x 3.5 x 6.2 cm = volume: 112 mL. Echogenicity within normal limits. No mass or hydronephrosis visualized. Left Kidney: Renal measurements: 10.5 x 4.4 x 3.5 cm = volume: 83 mL. Left nephrostomy catheter in place. No hydronephrosis. No mass. Bladder: Layering debris noted in the bladder. Other: Mild ascites and left pleural effusion noted. IMPRESSION:  Left nephrostomy catheter in place.  No hydronephrosis. Mild ascites and left pleural effusion. Electronically Signed   By: Rolm Baptise M.D.   On: 08/14/2021 10:47        Scheduled Meds:  amiodarone  200 mg Oral Daily   apixaban  5 mg Oral Q12H   atorvastatin  40 mg Oral QHS   cefadroxil  500 mg Oral BID   Chlorhexidine Gluconate Cloth  6 each Topical Q0600   furosemide  40 mg Intravenous Daily   insulin aspart  0-9 Units Subcutaneous TID WC   midodrine  10 mg Oral TID WC    morphine injection  2 mg Intravenous Once   Continuous Infusions:   LOS: 3 days     Cordelia Poche, MD [facility] 08/15/2021, 10:23 AM  If 7PM-7AM, please contact night-coverage [email or web address]

## 2021-08-16 ENCOUNTER — Inpatient Hospital Stay (HOSPITAL_COMMUNITY): Payer: Medicare HMO

## 2021-08-16 DIAGNOSIS — I480 Paroxysmal atrial fibrillation: Secondary | ICD-10-CM | POA: Diagnosis not present

## 2021-08-16 DIAGNOSIS — I5023 Acute on chronic systolic (congestive) heart failure: Secondary | ICD-10-CM | POA: Diagnosis not present

## 2021-08-16 DIAGNOSIS — N179 Acute kidney failure, unspecified: Secondary | ICD-10-CM | POA: Diagnosis not present

## 2021-08-16 DIAGNOSIS — J90 Pleural effusion, not elsewhere classified: Secondary | ICD-10-CM | POA: Diagnosis not present

## 2021-08-16 LAB — HEPATIC FUNCTION PANEL
ALT: 232 U/L — ABNORMAL HIGH (ref 0–44)
ALT: 235 U/L — ABNORMAL HIGH (ref 0–44)
AST: 495 U/L — ABNORMAL HIGH (ref 15–41)
AST: 500 U/L — ABNORMAL HIGH (ref 15–41)
Albumin: 2.2 g/dL — ABNORMAL LOW (ref 3.5–5.0)
Albumin: 2 g/dL — ABNORMAL LOW (ref 3.5–5.0)
Alkaline Phosphatase: 173 U/L — ABNORMAL HIGH (ref 38–126)
Alkaline Phosphatase: 165 U/L — ABNORMAL HIGH (ref 38–126)
Bilirubin, Direct: 0.5 mg/dL — ABNORMAL HIGH (ref 0.0–0.2)
Bilirubin, Direct: 0.6 mg/dL — ABNORMAL HIGH (ref 0.0–0.2)
Indirect Bilirubin: 0.8 mg/dL (ref 0.3–0.9)
Indirect Bilirubin: 1 mg/dL — ABNORMAL HIGH (ref 0.3–0.9)
Total Bilirubin: 1.3 mg/dL — ABNORMAL HIGH (ref 0.3–1.2)
Total Bilirubin: 1.6 mg/dL — ABNORMAL HIGH (ref 0.3–1.2)
Total Protein: 5.9 g/dL — ABNORMAL LOW (ref 6.5–8.1)
Total Protein: 5.8 g/dL — ABNORMAL LOW (ref 6.5–8.1)

## 2021-08-16 LAB — LACTATE DEHYDROGENASE, PLEURAL OR PERITONEAL FLUID: LD, Fluid: 111 U/L — ABNORMAL HIGH (ref 3–23)

## 2021-08-16 LAB — BLOOD GAS, ARTERIAL
Acid-base deficit: 4 mmol/L — ABNORMAL HIGH (ref 0.0–2.0)
Bicarbonate: 19.7 mmol/L — ABNORMAL LOW (ref 20.0–28.0)
O2 Content: 11 L/min
O2 Saturation: 80.1 %
Patient temperature: 98.2
pCO2 arterial: 32.7 mmHg (ref 32.0–48.0)
pH, Arterial: 7.397 (ref 7.350–7.450)
pO2, Arterial: 48.3 mmHg — ABNORMAL LOW (ref 83.0–108.0)

## 2021-08-16 LAB — BODY FLUID CELL COUNT WITH DIFFERENTIAL
Lymphs, Fluid: 15 %
Monocyte-Macrophage-Serous Fluid: 18 % — ABNORMAL LOW (ref 50–90)
Neutrophil Count, Fluid: 66 % — ABNORMAL HIGH (ref 0–25)
Other Cells, Fluid: 1 %
Total Nucleated Cell Count, Fluid: 189 cu mm (ref 0–1000)

## 2021-08-16 LAB — BASIC METABOLIC PANEL
Anion gap: 9 (ref 5–15)
BUN: 52 mg/dL — ABNORMAL HIGH (ref 8–23)
CO2: 19 mmol/L — ABNORMAL LOW (ref 22–32)
Calcium: 8.1 mg/dL — ABNORMAL LOW (ref 8.9–10.3)
Chloride: 107 mmol/L (ref 98–111)
Creatinine, Ser: 2.92 mg/dL — ABNORMAL HIGH (ref 0.61–1.24)
GFR, Estimated: 21 mL/min — ABNORMAL LOW (ref >60)
Glucose, Bld: 79 mg/dL (ref 70–99)
Potassium: 5 mmol/L (ref 3.5–5.1)
Sodium: 135 mmol/L (ref 135–145)

## 2021-08-16 LAB — GLUCOSE, CAPILLARY
Glucose-Capillary: 83 mg/dL (ref 70–99)
Glucose-Capillary: 88 mg/dL (ref 70–99)
Glucose-Capillary: 83 mg/dL (ref 70–99)

## 2021-08-16 LAB — PROTEIN, PLEURAL OR PERITONEAL FLUID: Total protein, fluid: <3 g/dL

## 2021-08-16 LAB — MRSA NEXT GEN BY PCR, NASAL: MRSA by PCR Next Gen: DETECTED — AB

## 2021-08-16 LAB — LACTATE DEHYDROGENASE: LDH: 657 U/L — ABNORMAL HIGH (ref 98–192)

## 2021-08-16 MED ORDER — LIP MEDEX EX OINT
TOPICAL_OINTMENT | CUTANEOUS | Status: AC
  Filled 2021-08-16: qty 7

## 2021-08-16 MED ORDER — MUPIROCIN 2 % EX OINT
1.0000 "application " | TOPICAL_OINTMENT | Freq: Two times a day (BID) | CUTANEOUS | Status: DC
  Administered 2021-08-17 – 2021-08-19 (×6): 1 via NASAL
  Filled 2021-08-16: qty 22

## 2021-08-16 MED ORDER — LIDOCAINE HCL 1 % IJ SOLN
INTRAMUSCULAR | Status: AC
  Administered 2021-08-16: 09:00:00 10 mL
  Filled 2021-08-16: qty 20

## 2021-08-16 MED ORDER — CHLORHEXIDINE GLUCONATE CLOTH 2 % EX PADS
6.0000 | MEDICATED_PAD | Freq: Every day | CUTANEOUS | Status: DC
  Administered 2021-08-16 – 2021-08-20 (×5): 6 via TOPICAL

## 2021-08-16 MED ORDER — FUROSEMIDE 10 MG/ML IJ SOLN
80.0000 mg | Freq: Three times a day (TID) | INTRAMUSCULAR | Status: DC
Start: 2021-08-16 — End: 2021-08-17
  Administered 2021-08-16 – 2021-08-17 (×3): 80 mg via INTRAVENOUS
  Filled 2021-08-16 (×4): qty 8

## 2021-08-16 NOTE — Progress Notes (Addendum)
Pt O2 sat decreased to 84% on 3L [place]. O2 increase it to 10L [facility] to achieve O2 sat of 93%. Lung sounds are clear and pt is not in any distress. He denies difficulty breathing/pain. RT notified and provider notified and is aware. Rounding MD to evaluate in the morning  08/16/21 0415  Vitals  Temp 98.2 F (36.8 C)  Temp Source Oral  BP 103/63  MAP (mmHg) 76  BP Location Right Arm  BP Method Automatic  Patient Position (if appropriate) Lying  Pulse Rate 68  Pulse Rate Source Monitor  Resp 16  MEWS COLOR  MEWS Score Color Green  Oxygen Therapy  SpO2 92 %  O2 Device Nasal Cannula  O2 Flow Rate (L/min) 10 L/min  Pain Assessment  Pain Scale 0-10  Pain Score 0  POSS Scale ([place] Opioid Sedation Scale)  POSS *See Group Information* S-Acceptable,Sleep, easy to arouse  MEWS Score  MEWS Temp 0  MEWS Systolic 0  MEWS Pulse 0  MEWS RR 0  MEWS LOC 0  MEWS Score 0

## 2021-08-16 NOTE — Progress Notes (Signed)
Subjective:  still pretty somnolent- UOP increased -  crt worse-  LFTs worse as well-  had to have a thoracentesis for hypoxia-  removed 1.3 liter with improved O2 sats   Objective Vital signs in last 24 hours: Vitals:   08/16/21 0500 08/16/21 0835 08/16/21 0855 08/16/21 0900  BP:  (!) 110/52 (!) 107/53   Pulse:      Resp:      Temp:      TempSrc:      SpO2:    98%  Weight: 66.5 kg     Height:       Weight change: 2.1 kg  Intake/Output Summary (Last 24 hours) at 08/16/2021 1238 Last data filed at 08/16/2021 0427 Gross per 24 hour  Intake 430 ml  Output 425 ml  Net 5 ml     Assessment/Plan: 75 year old BM with multiple medical issues including diffuse vasc disease with cardiomyopathy and a few episodes of AKI in the past-  3 mos ago req nephrostomy tube and one HD treatment-  now with recurrent AKI 1.Renal- recurrent AKI since normal renal function in October of 2022.   Several imaging studies have not indicated hydronephrosis so that does not appear to be the issue.  What instead I am concerned about is possible UTI/ urosepsis even-  has been on duracef and urine culture is negative but most recent urine looks like UTI-  abx broadened to maxepime.  Also with his low and low normal BPs for someone with diffuse vascular dz he may not be getting enough renal perfusion pressure-  he is not on any antihypertensives and he is on max dose midodrine-  he was on neurontin, muscle relaxers and narcotics which can lower BP-  I stopped the neurontin and muscle relaxers-  I kept the narcotic as was PRN-  There are not current dialysis indications-  I explained how dialysis would be difficult given his low BP and his constellation of other medical issues-  is tough because he did get in the past and recovered so they dont see it as a bad thing.  I hope we will be able to get this to turn around with just medical management.  Still heading in the wrong direction but no dialysis indications-  inc UOP  makes me more optimistic  2. Hypertension/volume  - he is overloaded- pitting edema and pleural effusions. Will increase lasix again and see if he responds 3. Anemia  - mod-  in the setting of frequent hosp and procedures-  not below a level to treat just yet 4. Nephrostomy tube-  It does not seem that he needs any more-  I have written an order to remove  5. UTI-  broadened to cefepime-  getting neph tube out may help as well 6. Hyperkalemia-  given one dose lokelma on Sunday  7. Metabolic acidosis-  will give him sodium bicarb as well     Louis Meckel    Labs: Basic Metabolic Panel: Recent Labs  Lab 08/10/21 0513 08/11/21 0901 08/14/21 0949 08/15/21 0857 08/16/21 0526  NA 136   < > 135 137 135  K 4.8   < > 4.9 5.3* 5.0  CL 109   < > 108 108 107  CO2 18*   < > 19* 18* 19*  GLUCOSE 83   < > 109* 80 79  BUN 41*   < > 45* 49* 52*  CREATININE 1.51*   < > 2.32* 2.66* 2.92*  CALCIUM  8.2*   < > 8.0* 8.1* 8.1*  PHOS 3.0  --   --   --   --    < > = values in this interval not displayed.   Liver Function Tests: Recent Labs  Lab 08/13/21 1010 08/15/21 0857 08/16/21 0526  AST 273* 442* 495*  ALT 123* 203* 232*  ALKPHOS 143* 163* 173*  BILITOT 1.0 1.2 1.3*  PROT 6.1* 6.0* 5.9*  ALBUMIN 2.3* 2.3* 2.2*   Recent Labs  Lab 08/12/21 1109  LIPASE 23   No results for input(s): AMMONIA in the last 168 hours. CBC: Recent Labs  Lab 08/10/21 0513 08/11/21 0901 08/14/21 0949  WBC 8.4 8.1 8.0  NEUTROABS 6.0 5.8  --   HGB 9.0* 9.7* 10.1*  HCT 29.1* 31.6* 32.8*  MCV 73.7* 72.6* 72.4*  PLT 207 PLATELET CLUMPS NOTED ON SMEAR, UNABLE TO ESTIMATE 205   Cardiac Enzymes: No results for input(s): CKTOTAL, CKMB, CKMBINDEX, TROPONINI in the last 168 hours. CBG: Recent Labs  Lab 08/15/21 0739 08/15/21 1152 08/15/21 1704 08/15/21 2135 08/16/21 0753  GLUCAP 86 104* 111* 88 83    Iron Studies: No results for input(s): IRON, TIBC, TRANSFERRIN, FERRITIN in the last 72  hours. Studies/Results: DG Chest 1 View  Result Date: 08/16/2021 CLINICAL DATA:  75 year old male status post thoracentesis. EXAM: CHEST  1 VIEW COMPARISON:  Chest x-ray 08/15/2021. FINDINGS: Substantial decreased size of what is now a small right pleural effusion. No postprocedural pneumothorax. Persistent small to moderate left pleural effusion. Patchy areas of interstitial prominence and multifocal airspace disease noted in the lungs bilaterally (left greater than right). Heart size is mildly enlarged. Upper mediastinal contours are within normal limits allowing for patient positioning. Atherosclerotic calcifications in the thoracic aorta. Status post median sternotomy for mitral valve replacement. Left-sided pacemaker/AICD in place with lead tip projecting over the expected location of the right ventricular apex. IMPRESSION: 1. Decreased size of what is now a small right pleural effusion following thoracentesis. No postprocedural pneumothorax or other acute complicating features. 2. The appearance of the lungs is suggestive of multilobar bilateral pneumonia (left greater than right), as above. 3. Persistent small to moderate left pleural effusion. 4. Mild cardiomegaly. 5. Aortic atherosclerosis. Electronically Signed   By: Vinnie Langton M.D.   On: 08/16/2021 09:16   CT CHEST WO CONTRAST  Result Date: 08/15/2021 CLINICAL DATA:  Pleural effusion. EXAM: CT CHEST WITHOUT CONTRAST TECHNIQUE: Multidetector CT imaging of the chest was performed following the standard protocol without IV contrast. COMPARISON:  Earlier same day radiograph at 1050 hours. FINDINGS: Cardiovascular: Moderate cardiomegaly. No pericardial effusion. Left chest wall AICD leads within the right atrium and right ventricle. Mitral valve replacement. Postoperative changes of median sternotomy and coronary artery bypass graft. Scattered calcific atherosclerosis of the thoracic aorta. Main pulmonary artery measures 3.5 cm in diameter.  Mediastinum/Nodes: No enlarged mediastinal or axillary lymph nodes. Thyroid gland, trachea, and esophagus demonstrate no significant findings. Lungs/Pleura: No pneumothorax. Large right pleural effusion with near complete atelectatic collapse of the right lower lobe. Small left pleural effusion with compressive atelectasis at the left lung base. Perihilar patchy ground-glass opacities with associated septal thickening, left-greater-than-right. Upper Abdomen: No acute abnormality. Musculoskeletal: No chest wall mass or suspicious bone lesions identified. IMPRESSION: 1. No pneumothorax as questioned on earlier same day radiograph. 2. Moderate cardiomegaly with dilatation of the main pulmonary artery up to 3.5 cm in diameter, suggestive of pulmonary artery hypertension. 3. Large right and small left pleural effusions. There is near  complete atelectatic collapse of the right lower lobe. No focal consolidation suggestive of pneumonia. Electronically Signed   By: Ileana Roup M.D.   On: 08/15/2021 14:57   DG CHEST PORT 1 VIEW  Result Date: 08/15/2021 CLINICAL DATA:  Hypoxia. History of CHF, diabetes, hypertension, endocarditis. EXAM: PORTABLE CHEST 1 VIEW COMPARISON:  Chest radiograph 08/12/2021 FINDINGS: Left chest wall AICD with leads overlying the right atrium and right ventricle. Postoperative changes of median sternotomy, CABG, and mitral valve replacement. Low lung volumes with diffuse patchy and interstitial airspace opacities, mildly increased compared to 08/12/2021. Moderate layering right pleural effusion with probable loculated lateral component. Small left pleural effusion. Thin linear lucency noted at the right apex measures 0.4 cm, raising suspicion for possible pneumothorax. No findings of tension effect. Heart size is difficult to evaluate due to adjacent consolidations but appears enlarged, stable. Aortic calcifications. IMPRESSION: 1. Thin linear lucency at the right apex raises suspicion for  possible pneumothorax without tension effect. Critical Value/emergent results were called by telephone at the time of interpretation on 08/15/2021 at 11:43 am to provider RALPH NETTEY , who verbally acknowledged these results. 2. Increased diffuse interstitial and patchy airspace opacities as well as moderate right and small left pleural effusions, favored to represent worsening pulmonary edema though superimposed infection can not be excluded. 3.  Aortic Atherosclerosis (ICD10-I70.0). Electronically Signed   By: Ileana Roup M.D.   On: 08/15/2021 11:44   US THORACENTESIS ASP PLEURAL SPACE W/IMG GUIDE  Result Date: 08/16/2021 INDICATION: Shortness of breath. Large right pleural effusion. Request diagnostic and therapeutic thoracentesis. EXAM: ULTRASOUND GUIDED RIGHT THORACENTESIS MEDICATIONS: 1% plain lidocaine, 5 mL COMPLICATIONS: None immediate. PROCEDURE: An ultrasound guided thoracentesis was thoroughly discussed with the patient and questions answered. The benefits, risks, alternatives and complications were also discussed. The patient understands and wishes to proceed with the procedure. Written consent was obtained. Ultrasound was performed to localize and mark an adequate pocket of fluid in the right chest. The area was then prepped and draped in the normal sterile fashion. 1% Lidocaine was used for local anesthesia. Under ultrasound guidance a 6 Fr Safe-T-Centesis catheter was introduced. Thoracentesis was performed. The catheter was removed and a dressing applied. FINDINGS: A total of approximately 1.3 L of clear, dark yellow fluid was removed. Samples were sent to the laboratory as requested by the clinical team. IMPRESSION: Successful ultrasound guided right thoracentesis yielding 1.3 L of pleural fluid. Read by: Ascencion Dike PA-C Electronically Signed   By: Sandi Mariscal M.D.   On: 08/16/2021 09:41   Medications: Infusions:  ceFEPime (MAXIPIME) IV 2 g (08/15/21 1500)    Scheduled  Medications:  amiodarone  200 mg Oral Daily   apixaban  5 mg Oral Q12H   atorvastatin  40 mg Oral QHS   Chlorhexidine Gluconate Cloth  6 each Topical Daily   furosemide  80 mg Intravenous Daily   insulin aspart  0-9 Units Subcutaneous TID WC   midodrine  10 mg Oral TID WC    morphine injection  2 mg Intravenous Once   sodium bicarbonate  650 mg Oral BID    have reviewed scheduled and prn medications.  Physical Exam: General:  somnolent but arousable-  confused Heart: RRR Lungs: dec BS at bases Abdomen: soft, non tender Extremities: dep pitting edema    08/16/2021,12:38 PM  LOS: 4 days

## 2021-08-16 NOTE — Progress Notes (Signed)
Patient down to [place] for procedure.

## 2021-08-16 NOTE — Progress Notes (Signed)
Notified Lab that ABG being sent for analysis. 

## 2021-08-16 NOTE — Progress Notes (Addendum)
MD please call patient's family member: Leodis Alcocer, regarding medical decisions or updates. Family would prefer Jonny Dearden to be his legal guardian and support person.

## 2021-08-16 NOTE — Procedures (Signed)
PROCEDURE SUMMARY:  Successful US guided right thoracentesis. Yielded 1.3 L of clear dark yellow fluid. Pt tolerated procedure well. No immediate complications.  Specimen was sent for labs. CXR ordered.  EBL < 5 mL  Ascencion Dike PA-C 08/16/2021 8:55 AM

## 2021-08-16 NOTE — Progress Notes (Signed)
PROGRESS NOTE    Ryan [name]  ZDG:[id number] DOB: [birth year] DOA: 08/11/2021 PCP: [name], MD   Brief Narrative: Ryan [name] is a 75 y.o. male with a history of HFrEF and EF of 25% status post ICD, hypertension, diabetes mellitus type 2, peripheral artery disease, bilateral AKA's, DVT/PE, A. fib on Eliquis, CABG.  Patient presented secondary to dislodged nephrostomy tube, status postexchange.  Patient also noted to have evidence of volume overload and resultant AKI.  Patient started on IV diuresis and now with decreased urine output. Nephrology consulted.   Assessment & Plan:   * AKI (acute kidney injury) ([place]) Present on admission with concern for possible dehydration. AKI worsened during admission. Renal ultrasound significant for no hydronephrosis. CT imaging significant for anasarca. Lasix IV restarted but patient has had minimal urine output. Nephrology consulted -Nephrology recommendations: Continue Lasix, hopefully avoid HD -Continue Lasix -Strict in/out  Acute on chronic systolic CHF (congestive heart failure) (HCC) EF of 25-30%. AICD in place. Evidence of overload this admission likely related to IV fluids. Lasix IV diuresis started. Inaccurate in/out. BNP >4500. UOP improved over the last 24 hours. -Lasix as mentioned above -Strict in/out, daily weights  Elevated transaminase level Likely related to acute heart failure. No symptoms concerning for biliary pathology. AST/ALT trending up. -Add-on Hepatic function panel  Bilateral pleural effusion Secondary to volume overload in setting of heart failure. Associated acute respiratory failure with hypoxia, requiring up to 10 L/min secondary to worsening pleural effusion. Stat ultrasound thoracentesis ordered with 1.3 L removed -Follow-up pleural fluid culture, cell count, cytology, LDH, protein  S/P AKA (above knee amputation) bilateral (HCC) Noted.  Dyslipidemia -Continue Lipitor  Chronic hypotension Blood  pressure well controlled currently. On midodrine. 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  Type 2 diabetes mellitus ([place]) Patient is on Humalog as an outpatient. -Continue SSI  AF (paroxysmal atrial fibrillation) (HCC) -Continue Eliquis and amiodarone  Abdominal pain-resolved as of 08/12/2021 Likely GI related. EKG does not appear to be suggestive of ACS. Troponin obtained and is stable. Location of pain is not near chest. Abdominal x-ray without acute process. Symptoms improved with Maalox. Now resolved.  Malfunction of nephrostomy tube (HCC)-resolved as of 08/15/2021 Dislodged prior to admission. Left percutaneous nephrostomy tube exchanged on 11/18. Outpatient follow-up with IR. Now, does not appear to have urine output from tube. Documentation of purulent appearing discharge from ostomy site noted on 11/24. Afebrile. No recurrent drainage noted. Renal ultrasound without pathology noted. Nephrostomy tube removed on 11/25 per nephrology recommendations.    DVT prophylaxis: Eliquis Code Status:   Code Status: Full Code Family Communication: None at bedside Disposition Plan: Discharge to SNF pending bed availability in addition to improvement fluid status, improvement of AKI   Consultants:  None  Procedures:  NEPHROSTOMY TUBE EXCHANGE (08/07/2021)  Antimicrobials: None    Subjective: No issues this morning. No dyspnea.  Objective: Vitals:   08/16/21 0500 08/16/21 0835 08/16/21 0855 08/16/21 0900  BP:  (!) 110/52 (!) 107/53   Pulse:      Resp:      Temp:      TempSrc:      SpO2:    98%  Weight: 66.5 kg     Height:        Intake/Output Summary (Last 24 hours) at 08/16/2021 1011 Last data filed at 08/16/2021 0427 Gross per 24 hour  Intake 670  ml  Output 425 ml  Net 245 ml    Filed Weights   08/14/21 0401 08/15/21 0300 08/16/21 0500   Weight: 65.3 kg 64.4 kg 66.5 kg    Examination:  General exam: Appears calm and comfortable Respiratory system: Diminished without wheezing. Respiratory effort normal. Cardiovascular system: S1 & S2 heard, RRR. 2/6 systolic murmur Gastrointestinal system: Abdomen is nondistended, soft and nontender. No organomegaly or masses felt. Normal bowel sounds heard. Central nervous system: Alert and oriented. No focal neurological deficits. Musculoskeletal: Bilateral AKA Skin: No cyanosis. No rashes Psychiatry: Judgement and insight appear normal. Mood & affect appropriate.    Data Reviewed: I have personally reviewed following labs and imaging studies  CBC Lab Results  Component Value Date   WBC 8.0 08/14/2021   RBC 4.53 08/14/2021   HGB 10.1 (L) 08/14/2021   HCT 32.8 (L) 08/14/2021   MCV 72.4 (L) 08/14/2021   MCH 22.3 (L) 08/14/2021   PLT 205 08/14/2021   MCHC 30.8 08/14/2021   RDW 27.3 (H) 08/14/2021   LYMPHSABS 1.1 08/11/2021   MONOABS 0.8 08/11/2021   EOSABS 0.2 08/11/2021   BASOSABS 0.1 96/29/5284     Last metabolic panel Lab Results  Component Value Date   NA 135 08/16/2021   K 5.0 08/16/2021   CL 107 08/16/2021   CO2 19 (L) 08/16/2021   BUN 52 (H) 08/16/2021   CREATININE 2.92 (H) 08/16/2021   GLUCOSE 79 08/16/2021   GFRNONAA 21 (L) 08/16/2021   GFRAA 39 (L) 10/01/2020   CALCIUM 8.1 (L) 08/16/2021   PHOS 3.0 08/10/2021   PROT 5.9 (L) 08/16/2021   ALBUMIN 2.2 (L) 08/16/2021   LABGLOB 2.2 03/09/2021   BILITOT 1.3 (H) 08/16/2021   ALKPHOS 173 (H) 08/16/2021   AST 495 (H) 08/16/2021   ALT 232 (H) 08/16/2021   ANIONGAP 9 08/16/2021    CBG (last 3)  Recent Labs    08/15/21 1704 08/15/21 2135 08/16/21 0753  GLUCAP 111* 88 83      GFR: Estimated Creatinine Clearance: 12 mL/min (A) (by C-G formula based on SCr of 2.92 mg/dL (H)).  Coagulation Profile: No results for input(s): INR, PROTIME in the last 168 hours.   Recent Results (from the past 240  hour(s))  Resp Panel by RT-PCR (Flu A&B, Covid) Nasopharyngeal Swab     Status: None   Collection Time: 08/05/2021 10:14 PM   Specimen: Nasopharyngeal Swab; Nasopharyngeal(NP) swabs in vial transport medium  Result Value Ref Range Status   SARS Coronavirus 2 by RT PCR NEGATIVE NEGATIVE Final    Comment: (NOTE) SARS-CoV-2 target nucleic acids are NOT DETECTED.  The SARS-CoV-2 RNA is generally detectable in upper respiratory specimens during the acute phase of infection. The lowest concentration of SARS-CoV-2 viral copies this assay can detect is 138 copies/mL. A negative result does not preclude SARS-Cov-2 infection and should not be used as the sole basis for treatment or other patient management decisions. A negative result may occur with  improper specimen collection/handling, submission of specimen other than nasopharyngeal swab, presence of viral mutation(s) within the areas targeted by this assay, and inadequate number of viral copies(<138 copies/mL). A negative result must be combined with clinical observations, patient history, and epidemiological information. The expected result is Negative.  Fact Sheet for Patients:  EntrepreneurPulse.com.au  Fact Sheet for Healthcare Providers:  IncredibleEmployment.be  This test is no t yet approved or cleared by the [place] FDA and  has been authorized for detection and/or diagnosis of SARS-CoV-2 by  FDA under an Emergency Use Authorization (EUA). This EUA will remain  in effect (meaning this test can be used) for the duration of the COVID-19 declaration under Section 564(b)(1) of the Act, 21 U.S.C.section 360bbb-3(b)(1), unless the authorization is terminated  or revoked sooner.       Influenza A by PCR NEGATIVE NEGATIVE Final   Influenza B by PCR NEGATIVE NEGATIVE Final    Comment: (NOTE) The Xpert Xpress SARS-CoV-2/FLU/RSV plus assay is intended as an aid in the diagnosis of influenza from  Nasopharyngeal swab specimens and should not be used as a sole basis for treatment. Nasal washings and aspirates are unacceptable for Xpert Xpress SARS-CoV-2/FLU/RSV testing.  Fact Sheet for Patients: EntrepreneurPulse.com.au  Fact Sheet for Healthcare Providers: IncredibleEmployment.be  This test is not yet approved or cleared by the [place] FDA and has been authorized for detection and/or diagnosis of SARS-CoV-2 by FDA under an Emergency Use Authorization (EUA). This EUA will remain in effect (meaning this test can be used) for the duration of the COVID-19 declaration under Section 564(b)(1) of the Act, 21 U.S.C. section 360bbb-3(b)(1), unless the authorization is terminated or revoked.  Performed at [facility], [place] [address], [place], [place] [address]   MRSA Next Gen by PCR, Nasal     Status: Abnormal   Collection Time: 08/08/21  3:06 PM   Specimen: Nasal Mucosa; Nasal Swab  Result Value Ref Range Status   MRSA by PCR Next Gen DETECTED (A) NOT DETECTED Final    Comment: (NOTE) The GeneXpert MRSA Assay (FDA approved for NASAL specimens only), is one component of a comprehensive MRSA colonization surveillance program. It is not intended to diagnose MRSA infection nor to guide or monitor treatment for MRSA infections. Test performance is not FDA approved in patients less than 20 years old. Performed at [facility], [place] [address] [place], [place] [address]   Urine Culture     Status: None   Collection Time: 08/14/21 12:56 PM   Specimen: In/Out Cath Urine  Result Value Ref Range Status   Specimen Description   Final    IN/OUT CATH URINE Performed at [place] [address], [place], [place] [address]    Special Requests   Final    NONE Performed at [facility], [place] [address], [place], [place] [address]    Culture   Final    NO  GROWTH Performed at [facility], [place] [address], [place], [place] [address]    Report Status 08/15/2021 FINAL  Final         Radiology Studies: DG Chest 1 View  Result Date: 08/16/2021 CLINICAL DATA:  75 year old male status post thoracentesis. EXAM: CHEST  1 VIEW COMPARISON:  Chest x-ray 08/15/2021. FINDINGS: Substantial decreased size of what is now a small right pleural effusion. No postprocedural pneumothorax. Persistent small to moderate left pleural effusion. Patchy areas of interstitial prominence and multifocal airspace disease noted in the lungs bilaterally (left greater than right). Heart size is mildly enlarged. Upper mediastinal contours are within normal limits allowing for patient positioning. Atherosclerotic calcifications in the thoracic aorta. Status post median sternotomy for mitral valve replacement. Left-sided pacemaker/AICD in place with lead tip projecting over the expected location of the right ventricular apex. IMPRESSION: 1. Decreased size of what is now a small right pleural effusion following thoracentesis. No postprocedural pneumothorax or other acute complicating features. 2. The appearance of the lungs is suggestive of multilobar bilateral pneumonia (left  greater than right), as above. 3. Persistent small to moderate left pleural effusion. 4. Mild cardiomegaly. 5. Aortic atherosclerosis. Electronically Signed   By: Vinnie Langton M.D.   On: 08/16/2021 09:16   CT CHEST WO CONTRAST  Result Date: 08/15/2021 CLINICAL DATA:  Pleural effusion. EXAM: CT CHEST WITHOUT CONTRAST TECHNIQUE: Multidetector CT imaging of the chest was performed following the standard protocol without IV contrast. COMPARISON:  Earlier same day radiograph at 1050 hours. FINDINGS: Cardiovascular: Moderate cardiomegaly. No pericardial effusion. Left chest wall AICD leads within the right atrium and right ventricle. Mitral valve replacement. Postoperative changes of median sternotomy and  coronary artery bypass graft. Scattered calcific atherosclerosis of the thoracic aorta. Main pulmonary artery measures 3.5 cm in diameter. Mediastinum/Nodes: No enlarged mediastinal or axillary lymph nodes. Thyroid gland, trachea, and esophagus demonstrate no significant findings. Lungs/Pleura: No pneumothorax. Large right pleural effusion with near complete atelectatic collapse of the right lower lobe. Small left pleural effusion with compressive atelectasis at the left lung base. Perihilar patchy ground-glass opacities with associated septal thickening, left-greater-than-right. Upper Abdomen: No acute abnormality. Musculoskeletal: No chest wall mass or suspicious bone lesions identified. IMPRESSION: 1. No pneumothorax as questioned on earlier same day radiograph. 2. Moderate cardiomegaly with dilatation of the main pulmonary artery up to 3.5 cm in diameter, suggestive of pulmonary artery hypertension. 3. Large right and small left pleural effusions. There is near complete atelectatic collapse of the right lower lobe. No focal consolidation suggestive of pneumonia. Electronically Signed   By: Ileana Roup M.D.   On: 08/15/2021 14:57   US RENAL  Result Date: 08/14/2021 CLINICAL DATA:  Oliguria EXAM: RENAL / URINARY TRACT ULTRASOUND COMPLETE COMPARISON:  CT 08/11/2021.  Ultrasound 08/09/2021. FINDINGS: Right Kidney: Renal measurements: 10.0 x 3.5 x 6.2 cm = volume: 112 mL. Echogenicity within normal limits. No mass or hydronephrosis visualized. Left Kidney: Renal measurements: 10.5 x 4.4 x 3.5 cm = volume: 83 mL. Left nephrostomy catheter in place. No hydronephrosis. No mass. Bladder: Layering debris noted in the bladder. Other: Mild ascites and left pleural effusion noted. IMPRESSION: Left nephrostomy catheter in place.  No hydronephrosis. Mild ascites and left pleural effusion. Electronically Signed   By: Rolm Baptise M.D.   On: 08/14/2021 10:47   DG CHEST PORT 1 VIEW  Result Date: 08/15/2021 CLINICAL  DATA:  Hypoxia. History of CHF, diabetes, hypertension, endocarditis. EXAM: PORTABLE CHEST 1 VIEW COMPARISON:  Chest radiograph 08/12/2021 FINDINGS: Left chest wall AICD with leads overlying the right atrium and right ventricle. Postoperative changes of median sternotomy, CABG, and mitral valve replacement. Low lung volumes with diffuse patchy and interstitial airspace opacities, mildly increased compared to 08/12/2021. Moderate layering right pleural effusion with probable loculated lateral component. Small left pleural effusion. Thin linear lucency noted at the right apex measures 0.4 cm, raising suspicion for possible pneumothorax. No findings of tension effect. Heart size is difficult to evaluate due to adjacent consolidations but appears enlarged, stable. Aortic calcifications. IMPRESSION: 1. Thin linear lucency at the right apex raises suspicion for possible pneumothorax without tension effect. Critical Value/emergent results were called by telephone at the time of interpretation on 08/15/2021 at 11:43 am to provider Kateryn Marasigan , who verbally acknowledged these results. 2. Increased diffuse interstitial and patchy airspace opacities as well as moderate right and small left pleural effusions, favored to represent worsening pulmonary edema though superimposed infection can not be excluded. 3.  Aortic Atherosclerosis (ICD10-I70.0). Electronically Signed   By: Ileana Roup M.D.   On: 08/15/2021 11:44  US THORACENTESIS ASP PLEURAL SPACE W/IMG GUIDE  Result Date: 08/16/2021 INDICATION: Shortness of breath. Large right pleural effusion. Request diagnostic and therapeutic thoracentesis. EXAM: ULTRASOUND GUIDED RIGHT THORACENTESIS MEDICATIONS: 1% plain lidocaine, 5 mL COMPLICATIONS: None immediate. PROCEDURE: An ultrasound guided thoracentesis was thoroughly discussed with the patient and questions answered. The benefits, risks, alternatives and complications were also discussed. The patient understands and  wishes to proceed with the procedure. Written consent was obtained. Ultrasound was performed to localize and mark an adequate pocket of fluid in the right chest. The area was then prepped and draped in the normal sterile fashion. 1% Lidocaine was used for local anesthesia. Under ultrasound guidance a 6 Fr Safe-T-Centesis catheter was introduced. Thoracentesis was performed. The catheter was removed and a dressing applied. FINDINGS: A total of approximately 1.3 L of clear, dark yellow fluid was removed. Samples were sent to the laboratory as requested by the clinical team. IMPRESSION: Successful ultrasound guided right thoracentesis yielding 1.3 L of pleural fluid. Read by: Ascencion Dike PA-C Electronically Signed   By: Sandi Mariscal M.D.   On: 08/16/2021 09:41        Scheduled Meds:  amiodarone  200 mg Oral Daily   apixaban  5 mg Oral Q12H   atorvastatin  40 mg Oral QHS   furosemide  80 mg Intravenous Daily   insulin aspart  0-9 Units Subcutaneous TID WC   midodrine  10 mg Oral TID WC    morphine injection  2 mg Intravenous Once   sodium bicarbonate  650 mg Oral BID   Continuous Infusions:  ceFEPime (MAXIPIME) IV 2 g (08/15/21 1500)     LOS: 4 days     Cordelia Poche, MD [facility] 08/16/2021, 10:11 AM  If 7PM-7AM, please contact night-coverage [email or web address]

## 2021-08-17 ENCOUNTER — Inpatient Hospital Stay (HOSPITAL_COMMUNITY): Payer: Medicare HMO

## 2021-08-17 DIAGNOSIS — I5023 Acute on chronic systolic (congestive) heart failure: Secondary | ICD-10-CM | POA: Diagnosis not present

## 2021-08-17 DIAGNOSIS — N179 Acute kidney failure, unspecified: Secondary | ICD-10-CM | POA: Diagnosis not present

## 2021-08-17 DIAGNOSIS — J9601 Acute respiratory failure with hypoxia: Secondary | ICD-10-CM

## 2021-08-17 DIAGNOSIS — I480 Paroxysmal atrial fibrillation: Secondary | ICD-10-CM | POA: Diagnosis not present

## 2021-08-17 DIAGNOSIS — J90 Pleural effusion, not elsewhere classified: Secondary | ICD-10-CM | POA: Diagnosis not present

## 2021-08-17 LAB — GRAM STAIN

## 2021-08-17 LAB — HEPATIC FUNCTION PANEL
ALT: 218 U/L — ABNORMAL HIGH (ref 0–44)
AST: 422 U/L — ABNORMAL HIGH (ref 15–41)
Albumin: 1.9 g/dL — ABNORMAL LOW (ref 3.5–5.0)
Alkaline Phosphatase: 159 U/L — ABNORMAL HIGH (ref 38–126)
Bilirubin, Direct: 0.7 mg/dL — ABNORMAL HIGH (ref 0.0–0.2)
Indirect Bilirubin: 1 mg/dL — ABNORMAL HIGH (ref 0.3–0.9)
Total Bilirubin: 1.7 mg/dL — ABNORMAL HIGH (ref 0.3–1.2)
Total Protein: 5.5 g/dL — ABNORMAL LOW (ref 6.5–8.1)

## 2021-08-17 LAB — GLUCOSE, CAPILLARY
Glucose-Capillary: 75 mg/dL (ref 70–99)
Glucose-Capillary: 91 mg/dL (ref 70–99)
Glucose-Capillary: 107 mg/dL — ABNORMAL HIGH (ref 70–99)
Glucose-Capillary: 93 mg/dL (ref 70–99)

## 2021-08-17 LAB — BASIC METABOLIC PANEL
Anion gap: 9 (ref 5–15)
BUN: 54 mg/dL — ABNORMAL HIGH (ref 8–23)
CO2: 20 mmol/L — ABNORMAL LOW (ref 22–32)
Calcium: 7.9 mg/dL — ABNORMAL LOW (ref 8.9–10.3)
Chloride: 107 mmol/L (ref 98–111)
Creatinine, Ser: 3.21 mg/dL — ABNORMAL HIGH (ref 0.61–1.24)
GFR, Estimated: 19 mL/min — ABNORMAL LOW (ref >60)
Glucose, Bld: 68 mg/dL — ABNORMAL LOW (ref 70–99)
Potassium: 4.9 mmol/L (ref 3.5–5.1)
Sodium: 136 mmol/L (ref 135–145)

## 2021-08-17 MED ORDER — ORAL CARE MOUTH RINSE
15.0000 mL | Freq: Two times a day (BID) | OROMUCOSAL | Status: DC
  Administered 2021-08-17 – 2021-08-20 (×7): 15 mL via OROMUCOSAL

## 2021-08-17 MED ORDER — FUROSEMIDE 10 MG/ML IJ SOLN
120.0000 mg | Freq: Three times a day (TID) | INTRAMUSCULAR | Status: DC
  Administered 2021-08-17 – 2021-08-18 (×2): 120 mg via INTRAVENOUS
  Filled 2021-08-17: qty 2
  Filled 2021-08-17: qty 10
  Filled 2021-08-17: qty 12

## 2021-08-17 MED ORDER — METOLAZONE 5 MG PO TABS
5.0000 mg | ORAL_TABLET | Freq: Two times a day (BID) | ORAL | Status: DC
  Administered 2021-08-17 – 2021-08-18 (×2): 5 mg via ORAL
  Filled 2021-08-17 (×2): qty 1

## 2021-08-17 NOTE — Consult Note (Signed)
[facility] Inpatient Consult   08/17/2021  Ryan [name] 1946/06/30 [id number]  [place] [facility] CM)   Patient was reviewed for less than 30 days readmission and high risk score for unplanned readmission. Per review, current recommendation is for SNF.   Plan: Will continue to follow for progression and disposition. If patient transitions to a skilled facility then post hospital transition of care needs are to be met at a skilled nursing facility level of care.   Of note, [facility] Care Management services does not replace or interfere with any services that are arranged by inpatient case management or social work.   Netta Cedars, MSN, RN [facility] [employer] [phone]  Toll free office [phone]

## 2021-08-17 NOTE — Progress Notes (Signed)
Subjective:   up in chair, more alert today, BP's remain soft, UOP 625 cc yesterday. Wt's down slightly. Still feeling swollen in stumps and arms. Creat up to 3.21.    Objective Vital signs in last 24 hours: Vitals:   08/17/21 0800 08/17/21 1000 08/17/21 1100 08/17/21 1200  BP:  (!) 95/36 (!) 101/49   Pulse:  66 66   Resp:  19 18   Temp: 98.4 F (36.9 C)   97.6 F (36.4 C)  TempSrc: Oral   Axillary  SpO2:  92% 94%   Weight:      Height:       Weight change: -2.1 kg  Intake/Output Summary (Last 24 hours) at 08/17/2021 1515 Last data filed at 08/17/2021 0600 Gross per 24 hour  Intake --  Output 550 ml  Net -550 ml      Assessment/Plan: 75 year old BM with multiple medical issues including diffuse vasc disease with cardiomyopathy and a few episodes of AKI in the past-  3 mos ago req nephrostomy tube and one HD treatment-  now with recurrent AKI 1.Renal- recurrent AKI since normal renal function in October of 2022.   Several imaging studies have not indicated hydronephrosis so that does not appear to be the issue.  Concerned about is possible UTI/ urosepsis even-  has been on duracef and urine culture is negative but most recent urine looks like UTI-  abx broadened to maxipime.  Also with his low and low normal BPs for someone with diffuse vascular dz he may not be getting enough renal perfusion pressure-  he is not on any antihypertensives and he is on max dose midodrine. Neurontin and muscle relaxers were stopped because they can lower BP. There are not current dialysis indications.  I explained how dialysis would be difficult given his low BP and his constellation of other medical issues-  is tough because he did get in the past and recovered so they dont see it as a bad thing.  We hope we will be able to get this to turn around with just medical management. Will ^ IV lasix again to 120 mg tid, and add zaroxolyn 5 bid.   2. Hypertension/volume  - remains overloaded w/ diffuse pitting  edema and pleural effusions.  3. Anemia  - mod-  in the setting of frequent hosp and procedures-  not below a level to treat just yet 4. Nephrostomy tube-  It does not seem that he needs any more-  I have written an order to remove  5. UTI-  broadened to cefepime-  getting neph tube out may help as well 6. Hyperkalemia-  given one dose lokelma on Sunday  7. Metabolic acidosis-  on po sodium bicarb here    Ryan Splinter, MD 08/17/2021, 3:29 PM        Labs: Basic Metabolic Panel: Recent Labs  Lab 08/15/21 0857 08/16/21 0526 08/17/21 0308  NA 137 135 136  K 5.3* 5.0 4.9  CL 108 107 107  CO2 18* 19* 20*  GLUCOSE 80 79 68*  BUN 49* 52* 54*  CREATININE 2.66* 2.92* 3.21*  CALCIUM 8.1* 8.1* 7.9*    Liver Function Tests: Recent Labs  Lab 08/16/21 0526 08/16/21 1622 08/17/21 0308  AST 495* 500* 422*  ALT 232* 235* 218*  ALKPHOS 173* 165* 159*  BILITOT 1.3* 1.6* 1.7*  PROT 5.9* 5.8* 5.5*  ALBUMIN 2.2* 2.0* 1.9*    Recent Labs  Lab 08/12/21 1109  LIPASE 23  No results for input(s): AMMONIA in the last 168 hours. CBC: Recent Labs  Lab 08/11/21 0901 08/14/21 0949  WBC 8.1 8.0  NEUTROABS 5.8  --   HGB 9.7* 10.1*  HCT 31.6* 32.8*  MCV 72.6* 72.4*  PLT PLATELET CLUMPS NOTED ON SMEAR, UNABLE TO ESTIMATE 205    Cardiac Enzymes: No results for input(s): CKTOTAL, CKMB, CKMBINDEX, TROPONINI in the last 168 hours. CBG: Recent Labs  Lab 08/16/21 0753 08/16/21 1655 08/16/21 2125 08/17/21 0745 08/17/21 1206  GLUCAP 83 88 83 75 91     Iron Studies: No results for input(s): IRON, TIBC, TRANSFERRIN, FERRITIN in the last 72 hours. Studies/Results: DG Chest 1 View  Result Date: 08/16/2021 CLINICAL DATA:  75 year old male status post thoracentesis. EXAM: CHEST  1 VIEW COMPARISON:  Chest x-ray 08/15/2021. FINDINGS: Substantial decreased size of what is now a small right pleural effusion. No postprocedural pneumothorax. Persistent small to moderate left pleural  effusion. Patchy areas of interstitial prominence and multifocal airspace disease noted in the lungs bilaterally (left greater than right). Heart size is mildly enlarged. Upper mediastinal contours are within normal limits allowing for patient positioning. Atherosclerotic calcifications in the thoracic aorta. Status post median sternotomy for mitral valve replacement. Left-sided pacemaker/AICD in place with lead tip projecting over the expected location of the right ventricular apex. IMPRESSION: 1. Decreased size of what is now a small right pleural effusion following thoracentesis. No postprocedural pneumothorax or other acute complicating features. 2. The appearance of the lungs is suggestive of multilobar bilateral pneumonia (left greater than right), as above. 3. Persistent small to moderate left pleural effusion. 4. Mild cardiomegaly. 5. Aortic atherosclerosis. Electronically Signed   By: Vinnie Langton M.D.   On: 08/16/2021 09:16   DG CHEST PORT 1 VIEW  Result Date: 08/17/2021 CLINICAL DATA:  Acute respiratory failure with hypoxia EXAM: PORTABLE CHEST 1 VIEW COMPARISON:  08/16/2021 FINDINGS: Shallow inspiration with low lung volumes. Increased bilateral opacities. Probable small bilateral pleural effusions. Similar cardiomediastinal contours with cardiomegaly. Left chest wall ICD. Valve replacement. IMPRESSION: Increased bilateral opacities likely reflecting edema. Probable small bilateral pleural effusions. Electronically Signed   By: Macy Mis M.D.   On: 08/17/2021 08:18   US THORACENTESIS ASP PLEURAL SPACE W/IMG GUIDE  Result Date: 08/16/2021 INDICATION: Shortness of breath. Large right pleural effusion. Request diagnostic and therapeutic thoracentesis. EXAM: ULTRASOUND GUIDED RIGHT THORACENTESIS MEDICATIONS: 1% plain lidocaine, 5 mL COMPLICATIONS: None immediate. PROCEDURE: An ultrasound guided thoracentesis was thoroughly discussed with the patient and questions answered. The benefits,  risks, alternatives and complications were also discussed. The patient understands and wishes to proceed with the procedure. Written consent was obtained. Ultrasound was performed to localize and mark an adequate pocket of fluid in the right chest. The area was then prepped and draped in the normal sterile fashion. 1% Lidocaine was used for local anesthesia. Under ultrasound guidance a 6 Fr Safe-T-Centesis catheter was introduced. Thoracentesis was performed. The catheter was removed and a dressing applied. FINDINGS: A total of approximately 1.3 L of clear, dark yellow fluid was removed. Samples were sent to the laboratory as requested by the clinical team. IMPRESSION: Successful ultrasound guided right thoracentesis yielding 1.3 L of pleural fluid. Read by: Ascencion Dike PA-C Electronically Signed   By: Sandi Mariscal M.D.   On: 08/16/2021 09:41   Medications: Infusions:  ceFEPime (MAXIPIME) IV 2 g (08/16/21 1409)    Scheduled Medications:  amiodarone  200 mg Oral Daily   apixaban  5 mg Oral Q12H  Chlorhexidine Gluconate Cloth  6 each Topical Daily   furosemide  80 mg Intravenous Q8H   insulin aspart  0-9 Units Subcutaneous TID WC   mouth rinse  15 mL Mouth Rinse BID   midodrine  10 mg Oral TID WC    morphine injection  2 mg Intravenous Once   mupirocin ointment  1 application Nasal BID   sodium bicarbonate  650 mg Oral BID    have reviewed scheduled and prn medications.  Physical Exam: General:  somnolent but arousable-  confused Heart: RRR Lungs: dec BS at bases Abdomen: soft, non tender Extremities: dep pitting edema    08/17/2021,3:15 PM  LOS: 5 days

## 2021-08-17 NOTE — Progress Notes (Signed)
PROGRESS NOTE    Ryan [name]  DGU:[id number] DOB: 1946/08/29 DOA: 08/13/2021 PCP: [name], MD   Brief Narrative: Ryan [name] is a 75 y.o. male with a history of HFrEF and EF of 25% status post ICD, hypertension, diabetes mellitus type 2, peripheral artery disease, bilateral AKA's, DVT/PE, A. fib on Eliquis, CABG.  Patient presented secondary to dislodged nephrostomy tube, status postexchange.  Patient also noted to have evidence of volume overload and resultant AKI.  Patient started on IV diuresis and now with decreased urine output. Nephrology consulted.   Assessment & Plan:   * AKI (acute kidney injury) ([place]) Present on admission with concern for possible dehydration. AKI worsened during admission. Renal ultrasound significant for no hydronephrosis. CT imaging significant for anasarca. Lasix IV restarted but patient has had minimal urine output. Nephrology consulted. UOP of 550 mL over the last 24 hours. -Nephrology recommendations: Continue Lasix, hopefully avoid HD; pending today -Continue Lasix -Strict in/out  Bilateral pleural effusion Secondary to volume overload in setting of heart failure. Associated acute respiratory failure with hypoxia, requiring up to 10 L/min secondary to worsening pleural effusion. Stat ultrasound thoracentesis ordered (11/27) with 1.3 L removed. Pleural fluid culture with no growth to date. Light's criteria suggests transudate, consistent with volume overload from renal/heart failure -Follow-up pleural fluid culture, cytology -Repeat chest x-ray  Acute on chronic systolic CHF (congestive heart failure) (HCC) EF of 25-30%. AICD in place. Evidence of overload this admission likely related to IV fluids. Lasix IV diuresis started. Inaccurate in/out. BNP >4500. UOP of 550 mL over the last 24 hours -Lasix as mentioned above -Strict in/out, daily weights -May need to consult cardiology/heart failure team depending on goals of care/nephrology  recommendations for possible HD  Acute respiratory failure with hypoxia ([place]) Secondary to pleural effusions. Initial improvement with thoracentesis but now requiring 10 L/min of oxygen  Elevated transaminase level Likely related to acute heart failure. No symptoms concerning for biliary pathology. AST/ALT appear to have peaked and are trending down.  S/P AKA (above knee amputation) bilateral (HCC) Noted.  Dyslipidemia -Continue Lipitor  Chronic hypotension Blood pressure well controlled currently, but soft. On midodrine. 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  Type 2 diabetes mellitus ([place]) Patient is on Humalog as an outpatient. -Continue SSI  AF (paroxysmal atrial fibrillation) (HCC) -Continue Eliquis and amiodarone  Abdominal pain-resolved as of 08/12/2021 Likely GI related. EKG does not appear to be suggestive of ACS. Troponin obtained and is stable. Location of pain is not near chest. Abdominal x-ray without acute process. Symptoms improved with Maalox. Now resolved.  Malfunction of nephrostomy tube (HCC)-resolved as of 08/15/2021 Dislodged prior to admission. Left percutaneous nephrostomy tube exchanged on 11/18. Outpatient follow-up with IR. Now, does not appear to have urine output from tube. Documentation of purulent appearing discharge from ostomy site noted on 11/24. Afebrile. No recurrent drainage noted. Renal ultrasound without pathology noted. Nephrostomy tube removed on 11/25 per nephrology recommendations.    DVT prophylaxis: Eliquis Code Status:   Code Status: Full Code Family Communication: Daughter in law (5 minutes) Disposition Plan: Discharge to SNF pending bed availability in addition to improvement fluid status, improvement of AKI   Consultants:  Nephrology  Procedures:  NEPHROSTOMY TUBE EXCHANGE  (08/07/2021)  Antimicrobials: None    Subjective: Patient reports no dyspnea or chest pain. Worried about his swelling.  Objective: Vitals:   08/17/21  0200 08/17/21 0330 08/17/21 0400 08/17/21 0500  BP: (!) 103/49  (!) 94/50   Pulse: 63  65   Resp: 14  17   Temp:  98.3 F (36.8 C)    TempSrc:  Oral    SpO2: 91%  (!) 89%   Weight:    63.1 kg  Height:        Intake/Output Summary (Last 24 hours) at 08/17/2021 0753 Last data filed at 08/17/2021 0600 Gross per 24 hour  Intake --  Output 550 ml  Net -550 ml    Filed Weights   08/16/21 0500 08/16/21 1828 08/17/21 0500  Weight: 66.5 kg 64.4 kg 63.1 kg    Examination:  General exam: Appears calm and comfortable and in no acute distress. Conversant Respiratory: Bilateral rales at bases. Respiratory effort normal with no intercostal retractions or use of accessory muscles Cardiovascular: S1 & S2 heard, RRR. No murmurs, rubs, gallops or clicks. Bilateral upper/lower extremity edema Gastrointestinal: Abdomen is non-distended, soft and non-tender. No masses felt. Normal bowel sounds heard Neurologic: No focal neurological deficits Musculoskeletal: No calf tenderness Skin: No cyanosis. No new rashes Psychiatry: Alert and oriented. Memory intact. Mood & affect appropriate   Data Reviewed: I have personally reviewed following labs and imaging studies  CBC Lab Results  Component Value Date   WBC 8.0 08/14/2021   RBC 4.53 08/14/2021   HGB 10.1 (L) 08/14/2021   HCT 32.8 (L) 08/14/2021   MCV 72.4 (L) 08/14/2021   MCH 22.3 (L) 08/14/2021   PLT 205 08/14/2021   MCHC 30.8 08/14/2021   RDW 27.3 (H) 08/14/2021   LYMPHSABS 1.1 08/11/2021   MONOABS 0.8 08/11/2021   EOSABS 0.2 08/11/2021   BASOSABS 0.1 85/10/7739     Last metabolic panel Lab Results  Component Value Date   NA 136 08/17/2021   K 4.9 08/17/2021   CL 107 08/17/2021   CO2 20 (L) 08/17/2021   BUN 54 (H) 08/17/2021   CREATININE 3.21 (H) 08/17/2021    GLUCOSE 68 (L) 08/17/2021   GFRNONAA 19 (L) 08/17/2021   GFRAA 39 (L) 10/01/2020   CALCIUM 7.9 (L) 08/17/2021   PHOS 3.0 08/10/2021   PROT 5.5 (L) 08/17/2021   ALBUMIN 1.9 (L) 08/17/2021   LABGLOB 2.2 03/09/2021   BILITOT 1.7 (H) 08/17/2021   ALKPHOS 159 (H) 08/17/2021   AST 422 (H) 08/17/2021   ALT 218 (H) 08/17/2021   ANIONGAP 9 08/17/2021    CBG (last 3)  Recent Labs    08/16/21 0753 08/16/21 1655 08/16/21 2125  GLUCAP 83 88 83      GFR: Estimated Creatinine Clearance: 10.5 mL/min (A) (by C-G formula based on SCr of 3.21 mg/dL (H)).  Coagulation Profile: No results for input(s): INR, PROTIME in the last 168 hours.   Recent Results (from the past 240 hour(s))  MRSA Next Gen by PCR, Nasal     Status: Abnormal   Collection Time: 08/08/21  3:06 PM   Specimen: Nasal Mucosa; Nasal Swab  Result Value Ref Range Status   MRSA by PCR Next Gen DETECTED (A) NOT DETECTED Final    Comment: (NOTE) The GeneXpert MRSA Assay (FDA approved for NASAL specimens only), is one component of a comprehensive MRSA colonization surveillance program. It is not intended to diagnose MRSA infection nor to guide or monitor treatment for MRSA infections. Test performance is not FDA approved in patients less than 44 years old. Performed at [facility], [place] [address] [place],  [address]  Urine Culture     Status: None   Collection Time: 08/14/21 12:56 PM   Specimen: In/Out Cath Urine  Result Value Ref Range Status   Specimen Description   Final    IN/OUT CATH URINE Performed at [facility], [place] [address], [place], [place] [address]    Special Requests   Final    NONE Performed at [facility], [place] [address], [place], [place] [address]    Culture   Final    NO GROWTH Performed at [facility], [place] [address], [place], [place] [address]    Report Status 08/15/2021 FINAL  Final  Gram stain     Status: None  (Preliminary result)   Collection Time: 08/16/21 10:30 AM   Specimen: Fluid  Result Value Ref Range Status   Specimen Description FLUID  Final   Special Requests  PLEURAL, RIGHT  Final   Gram Stain   Final    WBC PRESENT,BOTH PMN AND MONONUCLEAR NO ORGANISMS SEEN CYTOSPIN SMEAR Performed at [facility], [place] [address] [place], [place] [address]    Report Status PENDING  Incomplete  Culture, body fluid w Gram Stain-bottle     Status: None (Preliminary result)   Collection Time: 08/16/21 10:30 AM   Specimen: Fluid  Result Value Ref Range Status   Specimen Description FLUID  Final   Special Requests  PLEURAL, RIGHT  Final   Culture   Final    NO GROWTH < 24 HOURS Performed at [facility], [place] [address] [place], [place] [address]    Report Status PENDING  Incomplete  MRSA Next Gen by PCR, Nasal     Status: Abnormal   Collection Time: 08/16/21  6:35 PM   Specimen: Nasal Mucosa; Nasal Swab  Result Value Ref Range Status   MRSA by PCR Next Gen DETECTED (A) NOT DETECTED Final    Comment: RESULT CALLED TO, READ BACK BY AND VERIFIED WITH: GRIFFIN,C. 08/16/21 @2241  BY SEEL,M. (NOTE) The GeneXpert MRSA Assay (FDA approved for NASAL specimens only), is one component of a comprehensive MRSA colonization surveillance program. It is not intended to diagnose MRSA infection nor to guide or monitor treatment for MRSA infections. Test performance is not FDA approved in patients less than 70 years old. Performed at [facility], [place] [address], [place], [place] [address]          Radiology Studies: DG Chest 1 View  Result Date: 08/16/2021 CLINICAL DATA:  75 year old male status post thoracentesis. EXAM: CHEST  1 VIEW COMPARISON:  Chest x-ray 08/15/2021. FINDINGS: Substantial decreased size of what is now a small right pleural effusion. No postprocedural pneumothorax. Persistent small to moderate left pleural effusion. Patchy areas of interstitial  prominence and multifocal airspace disease noted in the lungs bilaterally (left greater than right). Heart size is mildly enlarged. Upper mediastinal contours are within normal limits allowing for patient positioning. Atherosclerotic calcifications in the thoracic aorta. Status post median sternotomy for mitral valve replacement. Left-sided pacemaker/AICD in place with lead tip projecting over the expected location of the right ventricular apex. IMPRESSION: 1. Decreased size of what is now a small right pleural effusion following thoracentesis. No postprocedural pneumothorax or other acute complicating features. 2. The appearance of the lungs is suggestive of multilobar bilateral pneumonia (left greater than right), as above. 3. Persistent small to moderate left pleural effusion. 4. Mild cardiomegaly. 5. Aortic atherosclerosis. Electronically Signed   By: Mauri Brooklyn.D.  On: 08/16/2021 09:16   CT CHEST WO CONTRAST  Result Date: 08/15/2021 CLINICAL DATA:  Pleural effusion. EXAM: CT CHEST WITHOUT CONTRAST TECHNIQUE: Multidetector CT imaging of the chest was performed following the standard protocol without IV contrast. COMPARISON:  Earlier same day radiograph at 1050 hours. FINDINGS: Cardiovascular: Moderate cardiomegaly. No pericardial effusion. Left chest wall AICD leads within the right atrium and right ventricle. Mitral valve replacement. Postoperative changes of median sternotomy and coronary artery bypass graft. Scattered calcific atherosclerosis of the thoracic aorta. Main pulmonary artery measures 3.5 cm in diameter. Mediastinum/Nodes: No enlarged mediastinal or axillary lymph nodes. Thyroid gland, trachea, and esophagus demonstrate no significant findings. Lungs/Pleura: No pneumothorax. Large right pleural effusion with near complete atelectatic collapse of the right lower lobe. Small left pleural effusion with compressive atelectasis at the left lung base. Perihilar patchy ground-glass opacities  with associated septal thickening, left-greater-than-right. Upper Abdomen: No acute abnormality. Musculoskeletal: No chest wall mass or suspicious bone lesions identified. IMPRESSION: 1. No pneumothorax as questioned on earlier same day radiograph. 2. Moderate cardiomegaly with dilatation of the main pulmonary artery up to 3.5 cm in diameter, suggestive of pulmonary artery hypertension. 3. Large right and small left pleural effusions. There is near complete atelectatic collapse of the right lower lobe. No focal consolidation suggestive of pneumonia. Electronically Signed   By: Ileana Roup M.D.   On: 08/15/2021 14:57   DG CHEST PORT 1 VIEW  Result Date: 08/15/2021 CLINICAL DATA:  Hypoxia. History of CHF, diabetes, hypertension, endocarditis. EXAM: PORTABLE CHEST 1 VIEW COMPARISON:  Chest radiograph 08/12/2021 FINDINGS: Left chest wall AICD with leads overlying the right atrium and right ventricle. Postoperative changes of median sternotomy, CABG, and mitral valve replacement. Low lung volumes with diffuse patchy and interstitial airspace opacities, mildly increased compared to 08/12/2021. Moderate layering right pleural effusion with probable loculated lateral component. Small left pleural effusion. Thin linear lucency noted at the right apex measures 0.4 cm, raising suspicion for possible pneumothorax. No findings of tension effect. Heart size is difficult to evaluate due to adjacent consolidations but appears enlarged, stable. Aortic calcifications. IMPRESSION: 1. Thin linear lucency at the right apex raises suspicion for possible pneumothorax without tension effect. Critical Value/emergent results were called by telephone at the time of interpretation on 08/15/2021 at 11:43 am to provider Jennings Corado , who verbally acknowledged these results. 2. Increased diffuse interstitial and patchy airspace opacities as well as moderate right and small left pleural effusions, favored to represent worsening pulmonary  edema though superimposed infection can not be excluded. 3.  Aortic Atherosclerosis (ICD10-I70.0). Electronically Signed   By: Ileana Roup M.D.   On: 08/15/2021 11:44   US THORACENTESIS ASP PLEURAL SPACE W/IMG GUIDE  Result Date: 08/16/2021 INDICATION: Shortness of breath. Large right pleural effusion. Request diagnostic and therapeutic thoracentesis. EXAM: ULTRASOUND GUIDED RIGHT THORACENTESIS MEDICATIONS: 1% plain lidocaine, 5 mL COMPLICATIONS: None immediate. PROCEDURE: An ultrasound guided thoracentesis was thoroughly discussed with the patient and questions answered. The benefits, risks, alternatives and complications were also discussed. The patient understands and wishes to proceed with the procedure. Written consent was obtained. Ultrasound was performed to localize and mark an adequate pocket of fluid in the right chest. The area was then prepped and draped in the normal sterile fashion. 1% Lidocaine was used for local anesthesia. Under ultrasound guidance a 6 Fr Safe-T-Centesis catheter was introduced. Thoracentesis was performed. The catheter was removed and a dressing applied. FINDINGS: A total of approximately 1.3 L of clear, dark yellow fluid was removed.  Samples were sent to the laboratory as requested by the clinical team. IMPRESSION: Successful ultrasound guided right thoracentesis yielding 1.3 L of pleural fluid. Read by: Ascencion Dike PA-C Electronically Signed   By: Sandi Mariscal M.D.   On: 08/16/2021 09:41        Scheduled Meds:  amiodarone  200 mg Oral Daily   apixaban  5 mg Oral Q12H   atorvastatin  40 mg Oral QHS   Chlorhexidine Gluconate Cloth  6 each Topical Daily   furosemide  80 mg Intravenous Q8H   insulin aspart  0-9 Units Subcutaneous TID WC   mouth rinse  15 mL Mouth Rinse BID   midodrine  10 mg Oral TID WC    morphine injection  2 mg Intravenous Once   mupirocin ointment  1 application Nasal BID   sodium bicarbonate  650 mg Oral BID   Continuous Infusions:   ceFEPime (MAXIPIME) IV 2 g (08/16/21 1409)     LOS: 5 days     Cordelia Poche, MD [facility] 08/17/2021, 7:53 AM  If 7PM-7AM, please contact night-coverage [email or web address]

## 2021-08-17 NOTE — Assessment & Plan Note (Addendum)
Secondary to pleural effusions. Initial improvement with thoracentesis but now requiring 15 L/min NRB secondary to worsening pulmonary edema. Discussed with Nephrology this morning with recommendations above. -Keep O2 >90%

## 2021-08-17 NOTE — Progress Notes (Signed)
Physical Therapy Treatment Patient Details Name: Ryan [name] MRN: [id number] DOB: 08-01-1946 Today's Date: 08/17/2021   History of Present Illness 75 y.o. male admitted with AKI. s/p nephrostomy tube exchange 08/07/21.  PMH: HFrEF (25%), HTN, DM2, PVD s/p B AKAs.  DVT and PE in Aug, A.Fib, pt on eliquis    PT Comments    +2 max assist for supine to sit, +2 mod assist for sliding board transfer from bed to recliner. Pt on 11L O2 during activity, SaO2 88% at lowest during mobility.    Recommendations for follow up therapy are one component of a multi-disciplinary discharge planning process, led by the attending physician.  Recommendations may be updated based on patient status, additional functional criteria and insurance authorization.  Follow Up Recommendations  Skilled nursing-short term rehab (<3 hours/day)     Assistance Recommended at Discharge Frequent or constant Supervision/Assistance  Equipment Recommendations  None recommended by PT    Recommendations for Other Services       Precautions / Restrictions Precautions Precautions: Fall Precaution Comments: B AKA Restrictions Weight Bearing Restrictions: No     Mobility  Bed Mobility Overal bed mobility: Needs Assistance       Supine to sit: +2 for physical assistance;Max assist;HOB elevated     General bed mobility comments: +2 max A for supine to sit (pt 40%), assist to raise trunk    Transfers Overall transfer level: Needs assistance Equipment used: Sliding board Transfers: Bed to chair/wheelchair/BSC            Lateral/Scoot Transfers: With slide board;+2 physical assistance;+2 safety/equipment;Mod assist General transfer comment: used bed pad to scoot hips from bed to recliner with arm dropped, using sliding board, pt able to maintain upright posture during transfer    Ambulation/Gait                   Stairs             Wheelchair Mobility    Modified Rankin (Stroke  Patients Only)       Balance Overall balance assessment: Needs assistance Sitting-balance support: Single extremity supported Sitting balance-Leahy Scale: Poor Sitting balance - Comments: poor, requires single UE support                                    Cognition Arousal/Alertness: Awake/alert Behavior During Therapy: WFL for tasks assessed/performed Overall Cognitive Status: Within Functional Limits for tasks assessed                                          Exercises      General Comments        Pertinent Vitals/Pain Pain Assessment: No/denies pain Pain Score: 0-No pain    Home Living                          Prior Function            PT Goals (current goals can now be found in the care plan section) Acute Rehab PT Goals Patient Stated Goal: back to rehab PT Goal Formulation: With patient Time For Goal Achievement: 08/25/21 Potential to Achieve Goals: Good Progress towards PT goals: Progressing toward goals    Frequency    Min 2X/week      PT Plan  Current plan remains appropriate    Co-evaluation              AM-PAC PT "6 Clicks" Mobility   Outcome Measure  Help needed turning from your back to your side while in a flat bed without using bedrails?: A Lot Help needed moving from lying on your back to sitting on the side of a flat bed without using bedrails?: Total Help needed moving to and from a bed to a chair (including a wheelchair)?: Total Help needed standing up from a chair using your arms (e.g., wheelchair or bedside chair)?: Total Help needed to walk in hospital room?: Total Help needed climbing 3-5 steps with a railing? : Total 6 Click Score: 7    End of Session   Activity Tolerance: Patient tolerated treatment well;No increased pain Patient left: with call bell/phone within reach;in chair Nurse Communication: Mobility status;Other (comment) (use sliding board for transfers) PT Visit  Diagnosis: Other abnormalities of gait and mobility (R26.89);Muscle weakness (generalized) (M62.81)     Time: 3875-6433 PT Time Calculation (min) (ACUTE ONLY): 24 min  Charges:  $Therapeutic Activity: 23-37 mins                     Blondell Reveal Kistler PT 08/17/2021  Acute Rehabilitation Services Pager [phone] Office [phone]

## 2021-08-18 ENCOUNTER — Inpatient Hospital Stay (HOSPITAL_COMMUNITY): Payer: Medicare HMO

## 2021-08-18 DIAGNOSIS — J9601 Acute respiratory failure with hypoxia: Secondary | ICD-10-CM

## 2021-08-18 DIAGNOSIS — Z515 Encounter for palliative care: Secondary | ICD-10-CM

## 2021-08-18 DIAGNOSIS — I480 Paroxysmal atrial fibrillation: Secondary | ICD-10-CM | POA: Diagnosis not present

## 2021-08-18 DIAGNOSIS — I9589 Other hypotension: Secondary | ICD-10-CM | POA: Diagnosis not present

## 2021-08-18 DIAGNOSIS — I5023 Acute on chronic systolic (congestive) heart failure: Secondary | ICD-10-CM

## 2021-08-18 DIAGNOSIS — N179 Acute kidney failure, unspecified: Secondary | ICD-10-CM | POA: Diagnosis not present

## 2021-08-18 DIAGNOSIS — J90 Pleural effusion, not elsewhere classified: Secondary | ICD-10-CM | POA: Diagnosis not present

## 2021-08-18 DIAGNOSIS — I5084 End stage heart failure: Secondary | ICD-10-CM

## 2021-08-18 DIAGNOSIS — Z89612 Acquired absence of left leg above knee: Secondary | ICD-10-CM

## 2021-08-18 DIAGNOSIS — Z7189 Other specified counseling: Secondary | ICD-10-CM | POA: Diagnosis not present

## 2021-08-18 DIAGNOSIS — Z89611 Acquired absence of right leg above knee: Secondary | ICD-10-CM

## 2021-08-18 LAB — BASIC METABOLIC PANEL
Anion gap: 10 (ref 5–15)
BUN: 56 mg/dL — ABNORMAL HIGH (ref 8–23)
CO2: 20 mmol/L — ABNORMAL LOW (ref 22–32)
Calcium: 8.1 mg/dL — ABNORMAL LOW (ref 8.9–10.3)
Chloride: 107 mmol/L (ref 98–111)
Creatinine, Ser: 3.02 mg/dL — ABNORMAL HIGH (ref 0.61–1.24)
GFR, Estimated: 21 mL/min — ABNORMAL LOW (ref >60)
Glucose, Bld: 100 mg/dL — ABNORMAL HIGH (ref 70–99)
Potassium: 4.7 mmol/L (ref 3.5–5.1)
Sodium: 137 mmol/L (ref 135–145)

## 2021-08-18 LAB — GLUCOSE, CAPILLARY
Glucose-Capillary: 107 mg/dL — ABNORMAL HIGH (ref 70–99)
Glucose-Capillary: 94 mg/dL (ref 70–99)
Glucose-Capillary: 105 mg/dL — ABNORMAL HIGH (ref 70–99)
Glucose-Capillary: 118 mg/dL — ABNORMAL HIGH (ref 70–99)
Glucose-Capillary: 94 mg/dL (ref 70–99)

## 2021-08-18 LAB — CYTOLOGY - NON PAP

## 2021-08-18 MED ORDER — PANTOPRAZOLE SODIUM 40 MG PO TBEC
DELAYED_RELEASE_TABLET | ORAL | Status: AC
  Filled 2021-08-18: qty 1

## 2021-08-18 MED ORDER — LORAZEPAM 2 MG/ML IJ SOLN: 0.5000 mg | Freq: Four times a day (QID) | INTRAMUSCULAR | Status: DC | PRN

## 2021-08-18 MED ORDER — LORAZEPAM 2 MG/ML IJ SOLN
INTRAMUSCULAR | Status: AC
  Administered 2021-08-18: 0.5 mg via INTRAVENOUS
  Filled 2021-08-18: qty 1

## 2021-08-18 MED ORDER — DEXTROSE 5 % IV SOLN
160.0000 mg | Freq: Four times a day (QID) | INTRAVENOUS | Status: DC
  Administered 2021-08-18 – 2021-08-19 (×5): 160 mg via INTRAVENOUS
  Filled 2021-08-18 (×2): qty 16
  Filled 2021-08-18: qty 10
  Filled 2021-08-18: qty 16
  Filled 2021-08-18: qty 10
  Filled 2021-08-18: qty 16
  Filled 2021-08-18 (×2): qty 10

## 2021-08-18 MED ORDER — HYDROMORPHONE HCL 1 MG/ML IJ SOLN
0.2000 mg | INTRAMUSCULAR | Status: DC | PRN
Start: 2021-08-18 — End: 2021-08-19
  Administered 2021-08-19: 0.5 mg via INTRAVENOUS
  Filled 2021-08-18: qty 1

## 2021-08-18 MED ORDER — GLYCOPYRROLATE 0.2 MG/ML IJ SOLN
0.2000 mg | INTRAMUSCULAR | Status: DC | PRN
  Administered 2021-08-20: 0.2 mg via INTRAVENOUS
  Filled 2021-08-18: qty 1

## 2021-08-18 MED ORDER — METOLAZONE 5 MG PO TABS
10.0000 mg | ORAL_TABLET | Freq: Two times a day (BID) | ORAL | Status: DC
  Administered 2021-08-18: 10 mg via ORAL
  Filled 2021-08-18 (×3): qty 2

## 2021-08-18 NOTE — Progress Notes (Signed)
Subjective:   seen in ICU, no c/o today, ^FiO2 on NRB, not in distress. 650 cc uOP yesterday. Creat down today   Objective Vital signs in last 24 hours: Vitals:   08/18/21 0500 08/18/21 0600 08/18/21 0615 08/18/21 0620  BP: (!) 97/54 (!) 107/48    Pulse: 73 72 75   Resp: 14 18 19    Temp:      TempSrc:      SpO2: 91% (!) 87% (!) 60% 99%  Weight: 63.1 kg     Height:       Weight change: -1.3 kg  Intake/Output Summary (Last 24 hours) at 08/18/2021 0841 Last data filed at 08/18/2021 0500 Gross per 24 hour  Intake 222.64 ml  Output 1100 ml  Net -877.36 ml      Assessment/Plan: 75 year old BM with multiple medical issues including diffuse vasc disease with cardiomyopathy and a few episodes of AKI in the past-  3 mos ago req nephrostomy tube and one HD treatment-  now with recurrent AKI Renal- recurrent AKI, hx of normal renal fxn in Oct 2022.   Several imaging studies have not indicated hydronephrosis. Concerned about possible UTI/ urosepsis even-  has been on duracef and urine culture is negative but most recent urine looks like UTI-  abx broadened to maxipime.  Also with his low and low normal BPs for someone with diffuse vascular dz he may not be getting enough renal perfusion pressure-  he is not on any antihypertensives and he is on max dose midodrine. Another possibility if CHF/ CM (low EF 25-30%) causing AKI. We explained how dialysis could be difficult given his low BP and his constellation of other medical issues. Will ^ IV lasix again to max 160 mg qid and ^ zaroxolyn to max 10 bid. Will request transfer to [facility] in case needs HD. Have d/w pmd.  Hypertension/volume  - remains overloaded w/ diffuse edema and pleural effusions. Trying to maximize diuresis.  Anemia  - mod-  in the setting of frequent hosp and procedures-  not below a level to treat just yet Nephrostomy tube-  It does not seem that he needs any more-  I have written an order to remove  UTI-  broadened to cefepime-   getting neph tube out may help as well Hyperkalemia-  resolved Metabolic acidosis-  on po sodium bicarb here, better    Kelly Splinter, MD 08/18/2021, 8:41 AM        Labs: Basic Metabolic Panel: Recent Labs  Lab 08/16/21 0526 08/17/21 0308 08/18/21 0306  NA 135 136 137  K 5.0 4.9 4.7  CL 107 107 107  CO2 19* 20* 20*  GLUCOSE 79 68* 100*  BUN 52* 54* 56*  CREATININE 2.92* 3.21* 3.02*  CALCIUM 8.1* 7.9* 8.1*    Liver Function Tests: Recent Labs  Lab 08/16/21 0526 08/16/21 1622 08/17/21 0308  AST 495* 500* 422*  ALT 232* 235* 218*  ALKPHOS 173* 165* 159*  BILITOT 1.3* 1.6* 1.7*  PROT 5.9* 5.8* 5.5*  ALBUMIN 2.2* 2.0* 1.9*    Recent Labs  Lab 08/12/21 1109  LIPASE 23    No results for input(s): AMMONIA in the last 168 hours. CBC: Recent Labs  Lab 08/11/21 0901 08/14/21 0949  WBC 8.1 8.0  NEUTROABS 5.8  --   HGB 9.7* 10.1*  HCT 31.6* 32.8*  MCV 72.6* 72.4*  PLT PLATELET CLUMPS NOTED ON SMEAR, UNABLE TO ESTIMATE 205    Cardiac Enzymes: No results for input(s): CKTOTAL,  CKMB, CKMBINDEX, TROPONINI in the last 168 hours. CBG: Recent Labs  Lab 08/16/21 2125 08/17/21 0745 08/17/21 1206 08/17/21 1703 08/17/21 2116  GLUCAP 83 75 91 107* 93     Iron Studies: No results for input(s): IRON, TIBC, TRANSFERRIN, FERRITIN in the last 72 hours. Studies/Results: DG Chest 1 View  Result Date: 08/16/2021 CLINICAL DATA:  75 year old male status post thoracentesis. EXAM: CHEST  1 VIEW COMPARISON:  Chest x-ray 08/15/2021. FINDINGS: Substantial decreased size of what is now a small right pleural effusion. No postprocedural pneumothorax. Persistent small to moderate left pleural effusion. Patchy areas of interstitial prominence and multifocal airspace disease noted in the lungs bilaterally (left greater than right). Heart size is mildly enlarged. Upper mediastinal contours are within normal limits allowing for patient positioning. Atherosclerotic calcifications in  the thoracic aorta. Status post median sternotomy for mitral valve replacement. Left-sided pacemaker/AICD in place with lead tip projecting over the expected location of the right ventricular apex. IMPRESSION: 1. Decreased size of what is now a small right pleural effusion following thoracentesis. No postprocedural pneumothorax or other acute complicating features. 2. The appearance of the lungs is suggestive of multilobar bilateral pneumonia (left greater than right), as above. 3. Persistent small to moderate left pleural effusion. 4. Mild cardiomegaly. 5. Aortic atherosclerosis. Electronically Signed   By: Vinnie Langton M.D.   On: 08/16/2021 09:16   DG CHEST PORT 1 VIEW  Result Date: 08/18/2021 CLINICAL DATA:  Respiratory failure EXAM: PORTABLE CHEST 1 VIEW COMPARISON:  08/17/2021 FINDINGS: Previous median sternotomy and mitral valve replacement. Pacemaker/AICD in place. Slight further worsening of diffuse pulmonary density consistent with edema. Enlarging effusions, right more than left. IMPRESSION: Worsening of effusions and diffuse pulmonary density most consistent with congestive heart failure. Electronically Signed   By: Nelson Chimes M.D.   On: 08/18/2021 08:06   DG CHEST PORT 1 VIEW  Result Date: 08/17/2021 CLINICAL DATA:  Acute respiratory failure with hypoxia EXAM: PORTABLE CHEST 1 VIEW COMPARISON:  08/16/2021 FINDINGS: Shallow inspiration with low lung volumes. Increased bilateral opacities. Probable small bilateral pleural effusions. Similar cardiomediastinal contours with cardiomegaly. Left chest wall ICD. Valve replacement. IMPRESSION: Increased bilateral opacities likely reflecting edema. Probable small bilateral pleural effusions. Electronically Signed   By: Macy Mis M.D.   On: 08/17/2021 08:18   US THORACENTESIS ASP PLEURAL SPACE W/IMG GUIDE  Result Date: 08/16/2021 INDICATION: Shortness of breath. Large right pleural effusion. Request diagnostic and therapeutic  thoracentesis. EXAM: ULTRASOUND GUIDED RIGHT THORACENTESIS MEDICATIONS: 1% plain lidocaine, 5 mL COMPLICATIONS: None immediate. PROCEDURE: An ultrasound guided thoracentesis was thoroughly discussed with the patient and questions answered. The benefits, risks, alternatives and complications were also discussed. The patient understands and wishes to proceed with the procedure. Written consent was obtained. Ultrasound was performed to localize and mark an adequate pocket of fluid in the right chest. The area was then prepped and draped in the normal sterile fashion. 1% Lidocaine was used for local anesthesia. Under ultrasound guidance a 6 Fr Safe-T-Centesis catheter was introduced. Thoracentesis was performed. The catheter was removed and a dressing applied. FINDINGS: A total of approximately 1.3 L of clear, dark yellow fluid was removed. Samples were sent to the laboratory as requested by the clinical team. IMPRESSION: Successful ultrasound guided right thoracentesis yielding 1.3 L of pleural fluid. Read by: Ascencion Dike PA-C Electronically Signed   By: Sandi Mariscal M.D.   On: 08/16/2021 09:41   Medications: Infusions:  ceFEPime (MAXIPIME) IV Stopped (08/17/21 1847)   furosemide  Scheduled Medications:  amiodarone  200 mg Oral Daily   apixaban  5 mg Oral Q12H   Chlorhexidine Gluconate Cloth  6 each Topical Daily   insulin aspart  0-9 Units Subcutaneous TID WC   mouth rinse  15 mL Mouth Rinse BID   metolazone  10 mg Oral Q12H   midodrine  10 mg Oral TID WC    morphine injection  2 mg Intravenous Once   mupirocin ointment  1 application Nasal BID   sodium bicarbonate  650 mg Oral BID    have reviewed scheduled and prn medications.  Physical Exam: General:  somnolent but arousable-  confused Heart: RRR Lungs: dec BS at bases Abdomen: soft, non tender Extremities: dep pitting edema    08/18/2021,8:41 AM  LOS: 6 days

## 2021-08-18 NOTE — Progress Notes (Signed)
PROGRESS NOTE    [name]  JIR:[id number] DOB: 10-17-1945 DOA: 08/15/2021 PCP: [name], MD   Brief Narrative: Ryan [name] is a 75 y.o. male with a history of HFrEF and EF of 25% status post ICD, hypertension, diabetes mellitus type 2, peripheral artery disease, bilateral AKA's, DVT/PE, A. fib on Eliquis, CABG.  Patient presented secondary to dislodged nephrostomy tube, status postexchange.  Patient also noted to have evidence of volume overload and resultant AKI.  Patient started on IV diuresis and now with decreased urine output. Nephrology consulted.   Assessment & Plan:   * AKI (acute kidney injury) ([place]) Present on admission with concern for possible dehydration. AKI worsened during admission. Renal ultrasound significant for no hydronephrosis. CT imaging significant for anasarca. Lasix IV restarted but patient has had minimal urine output. Nephrology consulted. UOP of 1.1 L over the last 24 hours. -Nephrology recommendations: Increase to Lasix 160 mg q6 hours, Zaroxolyn, fluid restriction of 800 mL per day -Strict in/out  Bilateral pleural effusion Secondary to volume overload in setting of heart failure. Associated acute respiratory failure with hypoxia, requiring up to 10 L/min secondary to worsening pleural effusion. Stat ultrasound thoracentesis ordered (11/27) with 1.3 L removed. Pleural fluid culture with no growth to date. Light's criteria suggests transudate, consistent with volume overload from renal/heart failure.  Acute on chronic systolic CHF (congestive heart failure) (HCC) EF of 25-30%. AICD in place. Evidence of overload this admission likely related to IV fluids. Lasix IV diuresis started. Inaccurate in/out. BNP >4500. -Lasix as mentioned above -Strict in/out, daily weights -May need to consult cardiology/heart failure team depending on goals of care/nephrology recommendations for possible HD/CRRT  Acute respiratory failure with hypoxia  ([place]) Secondary to pleural effusions. Initial improvement with thoracentesis but now requiring 15 L/min NRB secondary to worsening pulmonary edema. Discussed with Nephrology this morning with recommendations above. -Keep O2 >90%  Elevated transaminase level Likely related to acute heart failure. No symptoms concerning for biliary pathology. AST/ALT appear to have peaked and are trending down.  S/P AKA (above knee amputation) bilateral (HCC) Noted.  Dyslipidemia -Hold Lipitor secondary to elevated AST/ALT  Chronic hypotension Blood pressure well controlled currently, but soft. On midodrine. 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  Type 2 diabetes mellitus ([place]) Patient is on Humalog as an outpatient. -Continue SSI  AF (paroxysmal atrial fibrillation) (HCC) -Continue Eliquis and amiodarone  Abdominal pain-resolved as of 08/12/2021 Likely GI related. EKG does not appear to be suggestive of ACS. Troponin obtained and is stable. Location of pain is not near chest. Abdominal x-ray without acute process. Symptoms improved with Maalox. Now resolved.  Malfunction of nephrostomy tube (HCC)-resolved as of 08/15/2021 Dislodged prior to admission. Left percutaneous nephrostomy tube exchanged on 11/18. Outpatient follow-up with IR. Now, does not appear to have urine output from tube. Documentation of purulent appearing discharge from ostomy site noted on 11/24. Afebrile. No recurrent drainage noted. Renal ultrasound without pathology noted. Nephrostomy tube removed on 11/25 per nephrology recommendations.    DVT prophylaxis: Eliquis Code Status:   Code Status: Full Code Family Communication: Daughter in law (11/29) Disposition Plan: Discharge to SNF pending bed availability in addition to improvement fluid status, improvement of AKI   Consultants:   Nephrology  Procedures:  NEPHROSTOMY TUBE EXCHANGE (08/07/2021)  Antimicrobials: None    Subjective: Worsening dyspnea. Increased to 15 L/min NRB overnight secondary to  worsening hypoxia.  Objective: Vitals:   08/18/21 0500 08/18/21 0600 08/18/21 0615 08/18/21 0620  BP: (!) 97/54 (!) 107/48    Pulse: 73 72 75   Resp: 14 18 19    Temp:      TempSrc:      SpO2: 91% (!) 87% (!) 60% 99%  Weight: 63.1 kg     Height:        Intake/Output Summary (Last 24 hours) at 08/18/2021 0721 Last data filed at 08/18/2021 0500 Gross per 24 hour  Intake 222.64 ml  Output 1100 ml  Net -877.36 ml    Filed Weights   08/16/21 1828 08/17/21 0500 08/18/21 0500  Weight: 64.4 kg 63.1 kg 63.1 kg    Examination:  General exam: Appears calm and comfortable and in no acute distress. Conversant Respiratory: Bilateral rales, slight tachypnea. Respiratory effort normal with no significant intercostal retractions or use of accessory muscles Cardiovascular: S1 & S2 heard, RRR. Gastrointestinal: Abdomen is non-distended, soft and non-tender. No masses felt. Normal bowel sounds heard Neurologic: No focal neurological deficits Musculoskeletal: No calf tenderness Skin: No cyanosis. No new rashes Psychiatry: Alert and oriented. Memory intact. Mood & affect appropriate  Data Reviewed: I have personally reviewed following labs and imaging studies  CBC Lab Results  Component Value Date   WBC 8.0 08/14/2021   RBC 4.53 08/14/2021   HGB 10.1 (L) 08/14/2021   HCT 32.8 (L) 08/14/2021   MCV 72.4 (L) 08/14/2021   MCH 22.3 (L) 08/14/2021   PLT 205 08/14/2021   MCHC 30.8 08/14/2021   RDW 27.3 (H) 08/14/2021   LYMPHSABS 1.1 08/11/2021   MONOABS 0.8 08/11/2021   EOSABS 0.2 08/11/2021   BASOSABS 0.1 03/47/4259     Last metabolic panel Lab Results  Component Value Date   NA 137 08/18/2021   K 4.7 08/18/2021   CL 107 08/18/2021   CO2 20 (L) 08/18/2021   BUN 56 (H) 08/18/2021   CREATININE 3.02 (H)  08/18/2021   GLUCOSE 100 (H) 08/18/2021   GFRNONAA 21 (L) 08/18/2021   GFRAA 39 (L) 10/01/2020   CALCIUM 8.1 (L) 08/18/2021   PHOS 3.0 08/10/2021   PROT 5.5 (L) 08/17/2021   ALBUMIN 1.9 (L) 08/17/2021   LABGLOB 2.2 03/09/2021   BILITOT 1.7 (H) 08/17/2021   ALKPHOS 159 (H) 08/17/2021   AST 422 (H) 08/17/2021   ALT 218 (H) 08/17/2021   ANIONGAP 10 08/18/2021    CBG (last 3)  Recent Labs    08/17/21 1206 08/17/21 1703 08/17/21 2116  GLUCAP 91 107* 93      GFR: Estimated Creatinine Clearance: 11.2 mL/min (A) (by C-G formula based on SCr of 3.02 mg/dL (H)).  Coagulation Profile: No results for input(s): INR, PROTIME in the last 168 hours.   Recent Results (from the past 240 hour(s))  MRSA Next Gen by PCR, Nasal     Status: Abnormal   Collection Time: 08/08/21  3:06 PM   Specimen: Nasal Mucosa; Nasal Swab  Result Value Ref Range Status   MRSA by PCR Next Gen DETECTED (A) NOT DETECTED Final    Comment: (NOTE) The GeneXpert MRSA Assay (FDA approved for NASAL specimens only), is one component of a comprehensive MRSA colonization surveillance program. It is not intended to diagnose MRSA infection nor to guide or monitor treatment for MRSA infections. Test performance is not FDA approved in patients less than 59 years old. Performed at [facility], [place] [address] [place], [place] [address]   Urine Culture  Status: None   Collection Time: 08/14/21 12:56 PM   Specimen: In/Out Cath Urine  Result Value Ref Range Status   Specimen Description   Final    IN/OUT CATH URINE Performed at [facility], [place] [address], [place], [place] [address]    Special Requests   Final    NONE Performed at [facility], [place] [address], [place], [place] [address]    Culture   Final    NO GROWTH Performed at [facility], [place] [address], [place], [place] [address]    Report Status 08/15/2021 FINAL  Final  Gram stain      Status: None   Collection Time: 08/16/21 10:30 AM   Specimen: Fluid  Result Value Ref Range Status   Specimen Description FLUID  Final   Special Requests  PLEURAL, RIGHT  Final   Gram Stain   Final    WBC PRESENT,BOTH PMN AND MONONUCLEAR NO ORGANISMS SEEN CYTOSPIN SMEAR Performed at [facility], [place] [address], [place], [place] [address]    Report Status 08/17/2021 FINAL  Final  Culture, body fluid w Gram Stain-bottle     Status: None (Preliminary result)   Collection Time: 08/16/21 10:30 AM   Specimen: Fluid  Result Value Ref Range Status   Specimen Description FLUID  Final   Special Requests  PLEURAL, RIGHT  Final   Culture   Final    NO GROWTH 2 DAYS Performed at [facility], [place] [address] [place], [place] [address]    Report Status PENDING  Incomplete  MRSA Next Gen by PCR, Nasal     Status: Abnormal   Collection Time: 08/16/21  6:35 PM   Specimen: Nasal Mucosa; Nasal Swab  Result Value Ref Range Status   MRSA by PCR Next Gen DETECTED (A) NOT DETECTED Final    Comment: RESULT CALLED TO, READ BACK BY AND VERIFIED WITH: GRIFFIN,C. 08/16/21 @2241  BY SEEL,M. (NOTE) The GeneXpert MRSA Assay (FDA approved for NASAL specimens only), is one component of a comprehensive MRSA colonization surveillance program. It is not intended to diagnose MRSA infection nor to guide or monitor treatment for MRSA infections. Test performance is not FDA approved in patients less than 75 years old. Performed at [facility], [place] [address] [place], [place] [address]          Radiology Studies: DG Chest 1 View  Result Date: 08/16/2021 CLINICAL DATA:  75 year old male status post thoracentesis. EXAM: CHEST  1 VIEW COMPARISON:  Chest x-ray 08/15/2021. FINDINGS: Substantial decreased size of what is now a small right pleural effusion. No postprocedural pneumothorax. Persistent small to moderate left pleural effusion. Patchy areas of interstitial prominence  and multifocal airspace disease noted in the lungs bilaterally (left greater than right). Heart size is mildly enlarged. Upper mediastinal contours are within normal limits allowing for patient positioning. Atherosclerotic calcifications in the thoracic aorta. Status post median sternotomy for mitral valve replacement. Left-sided pacemaker/AICD in place with lead tip projecting over the expected location of the right ventricular apex. IMPRESSION: 1. Decreased size of what is now a small right pleural effusion following thoracentesis. No postprocedural pneumothorax or other acute complicating features. 2. The appearance of the lungs is suggestive of multilobar bilateral pneumonia (left greater than right), as above. 3. Persistent small to moderate left pleural effusion. 4. Mild cardiomegaly. 5. Aortic atherosclerosis. Electronically Signed   By: Vinnie Langton M.D.   On: 08/16/2021 09:16   DG CHEST  PORT 1 VIEW  Result Date: 08/17/2021 CLINICAL DATA:  Acute respiratory failure with hypoxia EXAM: PORTABLE CHEST 1 VIEW COMPARISON:  08/16/2021 FINDINGS: Shallow inspiration with low lung volumes. Increased bilateral opacities. Probable small bilateral pleural effusions. Similar cardiomediastinal contours with cardiomegaly. Left chest wall ICD. Valve replacement. IMPRESSION: Increased bilateral opacities likely reflecting edema. Probable small bilateral pleural effusions. Electronically Signed   By: Macy Mis M.D.   On: 08/17/2021 08:18   US THORACENTESIS ASP PLEURAL SPACE W/IMG GUIDE  Result Date: 08/16/2021 INDICATION: Shortness of breath. Large right pleural effusion. Request diagnostic and therapeutic thoracentesis. EXAM: ULTRASOUND GUIDED RIGHT THORACENTESIS MEDICATIONS: 1% plain lidocaine, 5 mL COMPLICATIONS: None immediate. PROCEDURE: An ultrasound guided thoracentesis was thoroughly discussed with the patient and questions answered. The benefits, risks, alternatives and complications were also  discussed. The patient understands and wishes to proceed with the procedure. Written consent was obtained. Ultrasound was performed to localize and mark an adequate pocket of fluid in the right chest. The area was then prepped and draped in the normal sterile fashion. 1% Lidocaine was used for local anesthesia. Under ultrasound guidance a 6 Fr Safe-T-Centesis catheter was introduced. Thoracentesis was performed. The catheter was removed and a dressing applied. FINDINGS: A total of approximately 1.3 L of clear, dark yellow fluid was removed. Samples were sent to the laboratory as requested by the clinical team. IMPRESSION: Successful ultrasound guided right thoracentesis yielding 1.3 L of pleural fluid. Read by: Ascencion Dike PA-C Electronically Signed   By: Sandi Mariscal M.D.   On: 08/16/2021 09:41        Scheduled Meds:  amiodarone  200 mg Oral Daily   apixaban  5 mg Oral Q12H   Chlorhexidine Gluconate Cloth  6 each Topical Daily   insulin aspart  0-9 Units Subcutaneous TID WC   mouth rinse  15 mL Mouth Rinse BID   metolazone  5 mg Oral Q12H   midodrine  10 mg Oral TID WC    morphine injection  2 mg Intravenous Once   mupirocin ointment  1 application Nasal BID   sodium bicarbonate  650 mg Oral BID   Continuous Infusions:  ceFEPime (MAXIPIME) IV Stopped (08/17/21 1847)   furosemide 62 mL/hr at 08/18/21 0400     LOS: 6 days     Cordelia Poche, MD [facility] 08/18/2021, 7:21 AM  If 7PM-7AM, please contact night-coverage [email or web address]

## 2021-08-18 NOTE — TOC Progression Note (Signed)
Transition of Care [facility]) - Progression Note    Patient Details  Name: Ryan [name] MRN: [id number] Date of Birth: December 14, 1945  Transition of [facility] CM/SW Contact  Ross Ludwig, [place] Phone Number: 08/17/2021, 2:45pm  Clinical Narrative:     CSW continuing to follow patient's progress throughout discharge planning.  Palliative following patient for goals of care.  Expected Discharge Plan: Skilled Nursing Facility Barriers to Discharge: Other (must enter comment) (insurance authorization)  Expected Discharge Plan and Services Expected Discharge Plan: [place]   Discharge Planning Services: CM Consult   Living arrangements for the past 2 months: [place]                                       Social Determinants of Health (SDOH) Interventions    Readmission Risk Interventions Readmission Risk Prevention Plan 07/09/2021 04/15/2021  Transportation Screening Complete -  PCP or Specialist Appt within 5-7 Days Complete -  Home Care Screening Complete Complete  Medication Review (RN CM) Complete Complete  Some recent data might be hidden

## 2021-08-18 NOTE — Consult Note (Signed)
Cardiology Consultation:   Patient ID: SEHAJ MCENROE MRN: [id number]; DOB: 11-01-1945  Admit date: 07/26/2021 Date of Consult: 08/18/2021  PCP:  [name], MD   [facility] Providers Cardiologist:  [name], MD  Electrophysiologist:  Constance Haw, MD  Advanced Heart Failure:  Loralie Champagne, MD     Allena Katz, FNP 04/03/2021  Patient Profile:   Ryan [name] is a 75 y.o. male with a hx of HFrEF w/ bivent failure and EF generally approx 25%, status post MDT ICD, HTN, DM II,  bilateral AKA's, DVT/PE, A. fib on Eliquis, PAD s/p R-fem-pop, history of endocarditis s/p mitral valve replacement in 2014.    He is being seen 08/18/2021 for the evaluation of volume overload, at the request of Dr Lonny Prude.  History of Present Illness:   Mr. Brandstetter was last seen in the office 07/15. Wt 73.1 kg, put on Lasix 40 mg qd x 3 days, then prn.   Patient presented 11/18 secondary to dislodged nephrostomy tube, status post exchange. He was felt to be dehydrated, s/p IVF.  Patient then had evidence of volume overload and  AKI. Diuresed, but renal function continued to worsen.    He had thoracentesis 11/27 with 1.3 L out.  The labs were consistent with a transudative effusion.    Nephrology started following the patient and is managing the diuretics.  His diuretic dose was increased to Lasix 160 mg IV every 6 hours and metolazone 10 mg twice daily.  He still has significant respiratory failure, currently requiring 15 L/min O2 by cannula to keep sats greater than 90%.  Nephrology has discussed possible HD w/ pt/family, and the problems he will likely have w/ low BP on max dose midodrine. However, he has had brief HD in the past, they do not see a problem with it.  The nephrostomy tube has been removed.  He has had 1 dose of Lokelma for a K+ 5.3.  His LFTs have been abnormal with a peak AST of 495 and a peak ALT of 235.  Albumin 2.0 and total protein 5.8.  After IM saw the pt  today, Dr. Lonny Prude rec Cards consult.  Mr. Lizer is rousable with verbal stimulus, but tends to doze off unless stimulated.  He answers appropriately, but seems a bit confused.  He says he is still driving, but does not have a car with hand controls.  He wants to go home and when I told him it was probably not a good idea for him to drive, he said his son-in-law could take him.  He denies chest pain or shortness of breath.  He does not know how long the swelling has been coming on.  No family is present, so I do not feel it is appropriate to discuss goals of care.   Past Medical History:  Diagnosis Date   Acute pericarditis 09/16/2012   Possible purulent pericarditis in setting of staph bacteremia   AICD (automatic cardioverter/defibrillator) present    Arthritis    Atrial fibrillation (HCC)    Cardiomyopathy, dilated (HCC)    CHF (congestive heart failure) (HCC)    Diabetes mellitus without complication ([place])    DVT of left external iliac vein 04/2021 06/06/2021   Endocarditis    Hypertension    Peripheral vascular disease ([place])    Pulmonary emboli 04/2021 06/06/2021   PVC's (premature ventricular contractions)    SVT (supraventricular tachycardia)  long RP     Past Surgical History:  Procedure Laterality  Date   ABDOMINAL AORTAGRAM N/A 10/03/2012   Procedure: ABDOMINAL Maxcine Ham;  Surgeon: [name], MD;  Location: [facility] CATH LAB;  Service: Cardiovascular;  Laterality: N/A;   ABDOMINAL AORTOGRAM W/LOWER EXTREMITY N/A 02/25/2021   Procedure: ABDOMINAL AORTOGRAM W/LOWER EXTREMITY;  Surgeon: Cherre Robins, MD;  Location: [place] CV LAB;  Service: Cardiovascular;  Laterality: N/A;   ABDOMINAL AORTOGRAM W/LOWER EXTREMITY N/A 04/13/2021   Procedure: ABDOMINAL AORTOGRAM W/LOWER EXTREMITY;  Surgeon: Waynetta Sandy, MD;  Location: [place] CV LAB;  Service: Cardiovascular;  Laterality: N/A;   AMPUTATION Right 05/12/2021   Procedure: AMPUTATION ABOVE KNEE RIGHT;   Surgeon: Angelia Mould, MD;  Location: [place];  Service: Vascular;  Laterality: Right;   AMPUTATION Left 07/07/2021   Procedure: LEFT ABOVE KNEE AMPUTATION;  Surgeon: Broadus John, MD;  Location: [place];  Service: Vascular;  Laterality: Left;   APPLICATION OF WOUND VAC Right 04/09/2021   Procedure: APPLICATION OF WOUND VAC;  Surgeon: Cherre Robins, MD;  Location: [place];  Service: Vascular;  Laterality: Right;   BYPASS GRAFT FEMORAL-PERONEAL Right 03/06/2021   Procedure: RIGHT ABOVE KNEE POPLITEAL ARTERY-PERONEAL BYPASS;  Surgeon: Cherre Robins, MD;  Location: [place];  Service: Vascular;  Laterality: Right;   CORONARY ANGIOGRAM  09/21/2012   Procedure: CORONARY ANGIOGRAM;  Surgeon: [name], MD;  Location: [facility] CATH LAB;  Service: Cardiovascular;;   EP IMPLANTABLE DEVICE N/A 06/11/2016   Procedure: ICD Implant;  Surgeon: Will Meredith Leeds, MD;  Location: [place] CV LAB;  Service: Cardiovascular;  Laterality: N/A;   EXTREMITY WIRE/PIN REMOVAL  09/14/2012   Procedure: REMOVAL K-WIRE/PIN EXTREMITY;  Surgeon: [name], MD;  Location: [place];  Service: Orthopedics;  Laterality: Right;  Right Foot   I & D EXTREMITY  09/14/2012   Procedure: IRRIGATION AND DEBRIDEMENT EXTREMITY;  Surgeon: Tennis Must, MD;  Location: [place];  Service: Orthopedics;  Laterality: Right;   INTRAOPERATIVE TRANSESOPHAGEAL ECHOCARDIOGRAM  09/26/2012   Procedure: INTRAOPERATIVE TRANSESOPHAGEAL ECHOCARDIOGRAM;  Surgeon: [name], MD;  Location: [facility] OR;  Service: Open Heart Surgery;  Laterality: N/A;   IR FLUORO GUIDE CV LINE RIGHT  04/20/2021   IR NEPHROSTOMY EXCHANGE LEFT  06/07/2021   IR NEPHROSTOMY PLACEMENT LEFT  04/20/2021   IR NEPHROSTOMY TUBE CHANGE  08/07/2021   IR US GUIDE VASC ACCESS RIGHT  04/20/2021   MITRAL VALVE REPLACEMENT  09/26/2012   Procedure: MITRAL VALVE (MV) REPLACEMENT;  Surgeon: [name], MD;  Location: [place];  Service: Open Heart Surgery;  Laterality: N/A;   RIGHT HEART CATH  N/A 08/29/2020   Procedure: RIGHT HEART CATH;  Surgeon: Larey Dresser, MD;  Location: [place] CV LAB;  Service: Cardiovascular;  Laterality: N/A;   RIGHT HEART CATHETERIZATION  09/21/2012   Procedure: RIGHT HEART CATH;  Surgeon: [name], MD;  Location: [facility] CATH LAB;  Service: Cardiovascular;;   RIGHT/LEFT HEART CATH AND CORONARY ANGIOGRAPHY N/A 01/30/2021   Procedure: RIGHT/LEFT HEART CATH AND CORONARY ANGIOGRAPHY;  Surgeon: Larey Dresser, MD;  Location: [place] CV LAB;  Service: Cardiovascular;  Laterality: N/A;   SVT ABLATION N/A 09/03/2020   Procedure: SVT ABLATION;  Surgeon: Constance Haw, MD;  Location: [place] CV LAB;  Service: Cardiovascular;  Laterality: N/A;   TEE WITHOUT CARDIOVERSION  09/18/2012   Procedure: TRANSESOPHAGEAL ECHOCARDIOGRAM (TEE);  Surgeon: [name], MD;  Location: [facility] ENDOS[facility];  Service: Cardiovascular;  Laterality: N/A;   WOUND DEBRIDEMENT Right  04/09/2021   Procedure: DEBRIDEMENT RIGHT HEEL WOUND AND PARTIAL FIRST TOE AMPUTATION AND SECOND TOE AMPUTATION. Application of Myriad skin substitute;  Surgeon: Cherre Robins, MD;  [facility] OR;  Service: Vascular;  Laterality: Right;     Home Medications:  Prior to Admission medications   Medication Sig Start Date End Date Taking? Authorizing Provider  acetaminophen (TYLENOL) 325 MG tablet Take 650 mg by mouth every 6 (six) hours as needed for mild pain.   Yes [provider]  Amino Acids-Protein Hydrolys (FEEDING SUPPLEMENT, PRO-STAT SUGAR FREE 64,) LIQD Take 30 mLs by mouth 3 (three) times daily. Take with 4 oz juice   Yes [provider]  amiodarone (PACERONE) 200 MG tablet TAKE 1 TABLET BY MOUTH EVERY DAY Patient taking differently: Take 200 mg by mouth daily. 03/03/21  Yes Bhagat, Bhavinkumar, PA  apixaban (ELIQUIS) 5 MG TABS tablet Take 1 tablet (5 mg total) by mouth every 12 (twelve) hours. 05/22/21  Yes [place] Eric J, DO  atorvastatin (LIPITOR) 40 MG tablet  Take 1 tablet (40 mg total) by mouth daily at 6 PM. Patient taking differently: Take 40 mg by mouth at bedtime. 10/06/12  Yes Timmothy Euler, MD  bisacodyl (DULCOLAX) 5 MG EC tablet Take 5 mg by mouth daily as needed (constipation).   Yes [provider]  docusate sodium (COLACE) 100 MG capsule Take 100 mg by mouth daily.   Yes [provider]  feeding supplement, GLUCERNA SHAKE, (GLUCERNA SHAKE) LIQD Take 237 mLs by mouth 3 (three) times daily between meals.   Yes [provider]  furosemide (LASIX) 20 MG tablet Take 1 tablet (20 mg total) by mouth every other day. 07/15/21  Yes Vann, Jessica U, DO  gabapentin (NEURONTIN) 300 MG capsule Take 300 mg by mouth at bedtime.   Yes [provider]  guaiFENesin (ROBITUSSIN) 100 MG/5ML liquid Take 10 mLs by mouth every 4 (four) hours as needed for cough.   Yes [provider]  insulin lispro (HUMALOG) 100 UNIT/ML injection Inject 0-10 Units into the skin 4 (four) times daily -  with meals and at bedtime. 0-59 notify MD 60-150 0 units 151-199 2 units 200-249 4 units 250-296 6 units 300-349 8 units 350-399 10 units 400+ 10 untis and notify MD   Yes [provider]  Menthol, Topical Analgesic, (BIOFREEZE) 4 % GEL Apply 1 application topically 2 (two) times daily.   Yes [provider]  methocarbamol (ROBAXIN) 500 MG tablet Take 500 mg by mouth every 12 (twelve) hours.   Yes [provider]  midodrine (PROAMATINE) 10 MG tablet Take 1 tablet (10 mg total) by mouth 3 (three) times daily with meals. 05/22/21  Yes [place] Eric J, DO  ondansetron (ZOFRAN) 4 MG tablet Take 4 mg by mouth every 6 (six) hours as needed for nausea or vomiting.   Yes [provider]  oxyCODONE (OXY IR/ROXICODONE) 5 MG immediate release tablet Take 1 tablet (5 mg total) by mouth every 6 (six) hours as needed for severe pain. 07/14/21  Yes Eulogio Bear U, DO  ciprofloxacin (CIPRO) 500 MG tablet Take 500  mg by mouth every 12 (twelve) hours. Patient not taking: Reported on 08/07/2021    [provider]  insulin aspart (NOVOLOG) 100 UNIT/ML injection Inject 0-6 Units into the skin 3 (three) times daily with meals. Patient not taking: Reported on 08/07/2021 07/14/21   Geradine Girt, DO    Inpatient Medications: Scheduled Meds:  amiodarone  200 mg  Oral Daily   apixaban  5 mg Oral Q12H   Chlorhexidine Gluconate Cloth  6 each Topical Daily   insulin aspart  0-9 Units Subcutaneous TID WC   mouth rinse  15 mL Mouth Rinse BID   metolazone  10 mg Oral Q12H   midodrine  10 mg Oral TID WC    morphine injection  2 mg Intravenous Once   mupirocin ointment  1 application Nasal BID   sodium bicarbonate  650 mg Oral BID   Continuous Infusions:  ceFEPime (MAXIPIME) IV Stopped (08/17/21 1847)   furosemide Stopped (08/18/21 1155)   PRN Meds: acetaminophen, alum & mag hydroxide-simeth, bisacodyl, guaiFENesin, ipratropium-albuterol, ondansetron **OR** ondansetron (ZOFRAN) IV, oxyCODONE, senna-docusate  Allergies:    Allergies  Allergen Reactions   Geralyn Flash [Fish Allergy] Nausea And Vomiting    Social History:   Social History   Socioeconomic History   Marital status: Divorced    Spouse name: Not on file   Number of children: 3   Years of education: Not on file   Highest education level: Not on file  Occupational History   Not on file  Tobacco Use   Smoking status: Never   Smokeless tobacco: Never  Vaping Use   Vaping Use: Never used  Substance and Sexual Activity   Alcohol use: No    Alcohol/week: 0.0 standard drinks   Drug use: No   Sexual activity: Not on file  Other Topics Concern   Not on file  Social History Narrative   Daughter lives with him usually but is living in [facility]  Retired [place] Strain: Not on file  Food Insecurity: No [place]  in the Last Year: Never true   Arboriculturist in the Last Year: Never true  Transportation Needs: No Transportation Needs   Lack of Transportation (Medical): No   Lack of Transportation (Non-Medical): No  Physical Activity: Not on file  Stress: Not on file  Social Connections: Not on file  Intimate Partner Violence: Not on file    Family History:   Family History  Problem Relation Age of Onset   Hypertension Mother    Hypertension Father      ROS:  Please see the history of present illness.  All other ROS reviewed and negative.     Physical Exam/Data:   Vitals:   08/18/21 0700 08/18/21 0800 08/18/21 0900 08/18/21 1138  BP: (!) 98/52 (!) 104/49 (!) 93/39   Pulse: 74 73    Resp: $Remo'15 19 17   '[place]$ Temp:  97.7 F (36.5 C)  (!) 97.5 F (36.4 C)  TempSrc:  Axillary  Axillary  SpO2: 97% 92%    Weight:      Height:        Intake/Output Summary (Last 24 hours) at 08/18/2021 1304 Last data filed at 08/18/2021 1100 Gross per 24 hour  Intake 462.64 ml  Output 1550 ml  Net -1087.36 ml   Last 3 Weights 08/18/2021 08/17/2021 08/16/2021  Weight (lbs) 139 lb 1.8 oz 139 lb 1.8 oz 141 lb 15.6 oz  Weight (kg) 63.1 kg 63.1 kg 64.4 kg     Body mass index is 42.45 kg/m.  General:  Well nourished, well developed, in mild respiratory distress HEENT: normal Neck: JVD 10 cm Vascular: No carotid bruits; Distal radial pulses 2+ bilaterally Cardiac:  normal S1, S2; RRR; + murmur but  difficult to hear because of noisy respirations Lungs: Dense Rales to auscultation bilaterally, no wheezing or rhonchi  Abd: soft, nontender, no hepatomegaly  Ext: + edema,  s/p bilat AKA Musculoskeletal:  No deformities, BUE strength extremely weak but equal Skin: warm and dry  Neuro:  CNs 2-12 intact, no focal abnormalities noted, oriented to name only Psych:  Normal affect   EKG:  The EKG was personally reviewed and demonstrates: 11/23 ECG is sinus rhythm with extremely long first-degree AV block, heart rate  65 Telemetry:  Telemetry was personally reviewed and demonstrates: Sinus rhythm, no significant ectopy, prolonged first-degree AV block with a PR interval 0.36 seconds  Relevant CV Studies:  ECHO: 07/10/2021  1. Left ventricular ejection fraction, by estimation, is 25 to 30%. The  left ventricle has severely decreased function. The left ventricle  demonstrates global hypokinesis. The left ventricular internal cavity size was mildly dilated. There is moderate left ventricular hypertrophy.   2. Right ventricular systolic function is moderately reduced. The right ventricular size is mildly enlarged. There is mildly elevated pulmonary artery systolic pressure.   3. Left atrial size was moderately dilated.   4. Right atrial size was mildly dilated.   5. The mitral valve has been repaired/replaced. Trivial mitral valve  regurgitation. No evidence of mitral stenosis. There is a 27 mm  bioprosthetic valve present in the mitral position.   6. The aortic valve is tricuspid. Aortic valve regurgitation is not  visualized. Mild aortic valve sclerosis is present, with no evidence of  aortic valve stenosis.   7. Aortic dilatation noted. There is borderline dilatation of the aortic  root, measuring 39 mm.   8. The inferior vena cava is normal in size with greater than 50%  respiratory variability, suggesting right atrial pressure of 3 mmHg.   Laboratory Data:  High Sensitivity Troponin:   Recent Labs  Lab 08/12/21 1109  TROPONINIHS 18*     Chemistry Recent Labs  Lab 08/16/21 0526 08/17/21 0308 08/18/21 0306  NA 135 136 137  K 5.0 4.9 4.7  CL 107 107 107  CO2 19* 20* 20*  GLUCOSE 79 68* 100*  BUN 52* 54* 56*  CREATININE 2.92* 3.21* 3.02*  CALCIUM 8.1* 7.9* 8.1*  GFRNONAA 21* 19* 21*  ANIONGAP $RemoveB'9 9 10    'BLFTZyHD$ Recent Labs  Lab 08/16/21 0526 08/16/21 1622 08/17/21 0308  PROT 5.9* 5.8* 5.5*  ALBUMIN 2.2* 2.0* 1.9*  AST 495* 500* 422*  ALT 232* 235* 218*  ALKPHOS 173* 165* 159*  BILITOT  1.3* 1.6* 1.7*   Lipids No results for input(s): CHOL, TRIG, HDL, LABVLDL, LDLCALC, CHOLHDL in the last 168 hours.  Hematology Recent Labs  Lab 08/14/21 0949  WBC 8.0  RBC 4.53  HGB 10.1*  HCT 32.8*  MCV 72.4*  MCH 22.3*  MCHC 30.8  RDW 27.3*  PLT 205   Thyroid No results for input(s): TSH, FREET4 in the last 168 hours.  BNP Recent Labs  Lab 08/13/21 1010  BNP >4,500.0*    DDimer No results for input(s): DDIMER in the last 168 hours.   Radiology/Studies:  DG Chest 1 View  Result Date: 08/16/2021 CLINICAL DATA:  75 year old male status post thoracentesis. EXAM: CHEST  1 VIEW COMPARISON:  Chest x-ray 08/15/2021. FINDINGS: Substantial decreased size of what is now a small right pleural effusion. No postprocedural pneumothorax. Persistent small to moderate left pleural effusion. Patchy areas of interstitial prominence and multifocal airspace disease noted in the lungs bilaterally (left greater than right). Heart  size is mildly enlarged. Upper mediastinal contours are within normal limits allowing for patient positioning. Atherosclerotic calcifications in the thoracic aorta. Status post median sternotomy for mitral valve replacement. Left-sided pacemaker/AICD in place with lead tip projecting over the expected location of the right ventricular apex. IMPRESSION: 1. Decreased size of what is now a small right pleural effusion following thoracentesis. No postprocedural pneumothorax or other acute complicating features. 2. The appearance of the lungs is suggestive of multilobar bilateral pneumonia (left greater than right), as above. 3. Persistent small to moderate left pleural effusion. 4. Mild cardiomegaly. 5. Aortic atherosclerosis. Electronically Signed   By: Vinnie Langton M.D.   On: 08/16/2021 09:16   CT CHEST WO CONTRAST  Result Date: 08/15/2021 CLINICAL DATA:  Pleural effusion. EXAM: CT CHEST WITHOUT CONTRAST TECHNIQUE: Multidetector CT imaging of the chest was performed  following the standard protocol without IV contrast. COMPARISON:  Earlier same day radiograph at 1050 hours. FINDINGS: Cardiovascular: Moderate cardiomegaly. No pericardial effusion. Left chest wall AICD leads within the right atrium and right ventricle. Mitral valve replacement. Postoperative changes of median sternotomy and coronary artery bypass graft. Scattered calcific atherosclerosis of the thoracic aorta. Main pulmonary artery measures 3.5 cm in diameter. Mediastinum/Nodes: No enlarged mediastinal or axillary lymph nodes. Thyroid gland, trachea, and esophagus demonstrate no significant findings. Lungs/Pleura: No pneumothorax. Large right pleural effusion with near complete atelectatic collapse of the right lower lobe. Small left pleural effusion with compressive atelectasis at the left lung base. Perihilar patchy ground-glass opacities with associated septal thickening, left-greater-than-right. Upper Abdomen: No acute abnormality. Musculoskeletal: No chest wall mass or suspicious bone lesions identified. IMPRESSION: 1. No pneumothorax as questioned on earlier same day radiograph. 2. Moderate cardiomegaly with dilatation of the main pulmonary artery up to 3.5 cm in diameter, suggestive of pulmonary artery hypertension. 3. Large right and small left pleural effusions. There is near complete atelectatic collapse of the right lower lobe. No focal consolidation suggestive of pneumonia. Electronically Signed   By: Ileana Roup M.D.   On: 08/15/2021 14:57   DG CHEST PORT 1 VIEW  Result Date: 08/18/2021 CLINICAL DATA:  Respiratory failure EXAM: PORTABLE CHEST 1 VIEW COMPARISON:  08/17/2021 FINDINGS: Previous median sternotomy and mitral valve replacement. Pacemaker/AICD in place. Slight further worsening of diffuse pulmonary density consistent with edema. Enlarging effusions, right more than left. IMPRESSION: Worsening of effusions and diffuse pulmonary density most consistent with congestive heart failure.  Electronically Signed   By: Nelson Chimes M.D.   On: 08/18/2021 08:06   DG CHEST PORT 1 VIEW  Result Date: 08/17/2021 CLINICAL DATA:  Acute respiratory failure with hypoxia EXAM: PORTABLE CHEST 1 VIEW COMPARISON:  08/16/2021 FINDINGS: Shallow inspiration with low lung volumes. Increased bilateral opacities. Probable small bilateral pleural effusions. Similar cardiomediastinal contours with cardiomegaly. Left chest wall ICD. Valve replacement. IMPRESSION: Increased bilateral opacities likely reflecting edema. Probable small bilateral pleural effusions. Electronically Signed   By: Macy Mis M.D.   On: 08/17/2021 08:18   DG CHEST PORT 1 VIEW  Result Date: 08/15/2021 CLINICAL DATA:  Hypoxia. History of CHF, diabetes, hypertension, endocarditis. EXAM: PORTABLE CHEST 1 VIEW COMPARISON:  Chest radiograph 08/12/2021 FINDINGS: Left chest wall AICD with leads overlying the right atrium and right ventricle. Postoperative changes of median sternotomy, CABG, and mitral valve replacement. Low lung volumes with diffuse patchy and interstitial airspace opacities, mildly increased compared to 08/12/2021. Moderate layering right pleural effusion with probable loculated lateral component. Small left pleural effusion. Thin linear lucency noted at the right apex  measures 0.4 cm, raising suspicion for possible pneumothorax. No findings of tension effect. Heart size is difficult to evaluate due to adjacent consolidations but appears enlarged, stable. Aortic calcifications. IMPRESSION: 1. Thin linear lucency at the right apex raises suspicion for possible pneumothorax without tension effect. Critical Value/emergent results were called by telephone at the time of interpretation on 08/15/2021 at 11:43 am to provider RALPH NETTEY , who verbally acknowledged these results. 2. Increased diffuse interstitial and patchy airspace opacities as well as moderate right and small left pleural effusions, favored to represent worsening  pulmonary edema though superimposed infection can not be excluded. 3.  Aortic Atherosclerosis (ICD10-I70.0). Electronically Signed   By: Ileana Roup M.D.   On: 08/15/2021 11:44   US THORACENTESIS ASP PLEURAL SPACE W/IMG GUIDE  Result Date: 08/16/2021 INDICATION: Shortness of breath. Large right pleural effusion. Request diagnostic and therapeutic thoracentesis. EXAM: ULTRASOUND GUIDED RIGHT THORACENTESIS MEDICATIONS: 1% plain lidocaine, 5 mL COMPLICATIONS: None immediate. PROCEDURE: An ultrasound guided thoracentesis was thoroughly discussed with the patient and questions answered. The benefits, risks, alternatives and complications were also discussed. The patient understands and wishes to proceed with the procedure. Written consent was obtained. Ultrasound was performed to localize and mark an adequate pocket of fluid in the right chest. The area was then prepped and draped in the normal sterile fashion. 1% Lidocaine was used for local anesthesia. Under ultrasound guidance a 6 Fr Safe-T-Centesis catheter was introduced. Thoracentesis was performed. The catheter was removed and a dressing applied. FINDINGS: A total of approximately 1.3 L of clear, dark yellow fluid was removed. Samples were sent to the laboratory as requested by the clinical team. IMPRESSION: Successful ultrasound guided right thoracentesis yielding 1.3 L of pleural fluid. Read by: Ascencion Dike PA-C Electronically Signed   By: Sandi Mariscal M.D.   On: 08/16/2021 09:41     Assessment and Plan:   Acute on chronic systolic CHF w/ bivent failure - Nephrology managing diuresis, w/ diuretics escalated due to poor UOP.  - Now on Lasix 160 mg IV q 6 hr w/ metolazone 10 mg bid and pt on fluid restrictions 800 cc/day - EF consistently about 25%, including limited echo 07/10/2021 - S/P thoracentesis 11/27 w/ 1.3 L out -Still requiring 15 L O2 by nasal cannula to maintain sats greater than 90% -Follow course on maximum dose diuretics -Despite  high-dose diuretics and thoracentesis, CXR on 11/29 showed increasing edema -The family is willing to pursue hemodialysis - no discussion of CODE STATUS was initiated   2. AKI on CKD - Cr 1.42 on admit, has been climbing during this stay, peak 3.21, slightly lower today - Nephrology following and managing diuretics - off meds that can affect renal function such as ACE/ARB PTA -Nephrology is concerned that his blood pressure may not be high enough for good renal perfusion and this may be contributing to his renal problems.  3. Anemia - H&H 8.4/27.3 on admit - improved to 10.1/32.8 w/out transfusion - managed by Nephrology, they do not feel H&H low enough to require transfusion    Risk Assessment/Risk Scores:       New York Heart Association (NYHA) Functional Class NYHA Class IV     For questions or updates, please contact [facility] Please consult [email or web address] for contact info under    Signed, Rosaria Ferries, PA-C  08/18/2021 1:04 PM

## 2021-08-18 NOTE — Progress Notes (Signed)
Called due to patient desating into the 70's per RN. Upon arrival, RN had placed patient on NRB on top of 15 Lpm HFNC. Patients sats were up to 97%. Patient diminished with some fine crackles on the left. Duoneb given at this time. CXR pending.

## 2021-08-18 NOTE — Consult Note (Signed)
Consultation Note Date: 08/18/2021   Patient Name: Ryan [name]  DOB: 1945-10-18  MRN: [id number]  Age / Sex: 75 y.o., male  PCP: Ryan Nakai, MD Referring Physician: Mariel Aloe, MD  Reason for Consultation: Establishing goals of care  HPI/Patient Profile: 75 y.o. male  with past medical history of Heart failure with reduced EF of 25% status post ICD, hypertension, diabetes, peripheral artery disease, bilateral AKA's, DVT, A. fib on Eliquis, CABG admitted on 08/05/2021 with acute on chronic congestive heart failure complicated by bilateral pleural effusions with respiratory failure with hypoxia and AKI.  His situation has remained difficult due to low blood pressure and poor candidate for dialysis.  Palliative consulted for goals of care.  Clinical Assessment and Goals of Care: Palliative care consult received.  Chart reviewed including personal review of pertinent labs and imaging.  I met today with Ryan [name] and his family.  I attempted to speak with Ryan [name] in conjunction with his family both before and after meeting with family outside of the room.  Unfortunately, he is not at a place where he is understanding enough to participate in conversation regarding goals of care.  I therefore met today outside of his room with his son, daughter, daughter-in-law, and sister.  I introduced palliative care as specialized medical care for people living with serious illness. It focuses on providing relief from the symptoms and stress of a serious illness. The goal is to improve quality of life for both the patient and the family.  Family reports that the doctors have been doing a good job explaining things to them and are able to relay conversation they had with Ryan [name] regarding his multiple comorbidities that are resulting in challenging clinical situation that continues to worsen despite best  medical interventions.  We discussed clinical course as well as wishes moving forward in regard to advanced directives and care plan this hospitalization.  Concepts specific to code status and rehospitalization discussed.  We discussed difference between a aggressive medical intervention path and a palliative, comfort focused care path.  Values and goals of care important to patient and family were attempted to be elicited.  Questions and concerns addressed.   PMT will continue to support holistically.  SUMMARY OF RECOMMENDATIONS   -DNR/DNI -Continue current interventions but no escalation of care in the event of further decline.  We specifically discussed DNR/DNI, no vasopressor support and no dialysis.   -We had previously been working to transfer Ryan [name] to Ryan [name] as dialysis was a consideration.  As dialysis is now off the table as an option, family requested transfer be canceled.  I canceled transfer per their request. -While he is DNR/DNI, he still has ICD in place.  We discussed plan for me to reach out to cardiology tomorrow to have their input, but I shared that I believe recommendation will be to deactivate ICD function. -Family inquired about trying to get him home with hospice support.  We discussed plan to reassess tomorrow with understanding that limiting factor  may be his oxygen requirement. -Overall, family is clear they do not want Ryan [name] to suffer if he is approaching end-of-life.  We discussed plan to treat pain or shortness of breath regardless of concern for hypotension or respiratory status if he needs medications to ensure he is comfortable.  I made addition of as needed medications for pain, shortness of breath, and anxiety.  Code Status/Advance Care Planning: DNR  Palliative Prophylaxis:  Frequent Pain Assessment  Psycho-social/Spiritual:  Desire for further Chaplaincy support: Not addressed today Additional Recommendations: Education on  Hospice  Prognosis:  Guarded  Discharge Planning: To Be Determined      Primary Diagnoses: Present on Admission:  DVT of left external iliac vein 04/2021  AKI (acute kidney injury) [facility])  Pulmonary emboli 04/2021   I have reviewed the medical record, interviewed the patient and family, and examined the patient. The following aspects are pertinent.  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 06/06/2021   Endocarditis    Hypertension    Peripheral vascular disease ([place])    Pulmonary emboli 04/2021 06/06/2021   PVC's (premature ventricular contractions)    SVT (supraventricular tachycardia)  long RP    Social History   Socioeconomic History   Marital status: Divorced    Spouse name: Not on file   Number of children: 3   Years of education: Not on file   Highest education level: Not on file  Occupational History   Not on file  Tobacco Use   Smoking status: Never   Smokeless tobacco: Never  Vaping Use   Vaping Use: Never used  Substance and Sexual Activity   Alcohol use: No    Alcohol/week: 0.0 standard drinks   Drug use: No   Sexual activity: Not on file  Other Topics Concern   Not on file  Social History Narrative   Daughter lives with him usually but is living in [facility]  Retired [place]   Social Determinants of Health   Financial Resource Strain: Not on file  Food Insecurity: No Food Insecurity   Worried About Charity fundraiser in the Last Year: Never true   Arboriculturist in the Last Year: Never true  Transportation Needs: No Transportation Needs   Lack of Transportation  (Medical): No   Lack of Transportation (Non-Medical): No  Physical Activity: Not on file  Stress: Not on file  Social Connections: Not on file   Family History  Problem Relation Age of Onset   Hypertension Mother    Hypertension Father    Scheduled Meds:  amiodarone  200 mg Oral Daily   apixaban  5 mg Oral Q12H   Chlorhexidine Gluconate Cloth  6 each Topical Daily   insulin aspart  0-9 Units Subcutaneous TID WC   mouth rinse  15 mL Mouth Rinse BID   metolazone  10 mg Oral Q12H   midodrine  10 mg Oral TID WC    morphine injection  2 mg Intravenous Once   mupirocin ointment  1 application Nasal BID   pantoprazole       sodium bicarbonate  650 mg  Oral BID   Continuous Infusions:  ceFEPime (MAXIPIME) IV Stopped (08/18/21 1611)   furosemide Stopped (08/18/21 1742)   PRN Meds:.acetaminophen, alum & mag hydroxide-simeth, bisacodyl, glycopyrrolate, guaiFENesin, HYDROmorphone (DILAUDID) injection, ipratropium-albuterol, LORazepam, ondansetron **OR** ondansetron (ZOFRAN) IV, senna-docusate Medications Prior to Admission:  Prior to Admission medications   Medication Sig Start Date End Date Taking? Authorizing Provider  acetaminophen (TYLENOL) 325 MG tablet Take 650 mg by mouth every 6 (six) hours as needed for mild pain.   Yes [provider]  Amino Acids-Protein Hydrolys (FEEDING SUPPLEMENT, PRO-STAT SUGAR FREE 64,) LIQD Take 30 mLs by mouth 3 (three) times daily. Take with 4 oz juice   Yes [provider]  amiodarone (PACERONE) 200 MG tablet TAKE 1 TABLET BY MOUTH EVERY DAY Patient taking differently: Take 200 mg by mouth daily. 03/03/21  Yes Bhagat, Bhavinkumar, PA  apixaban (ELIQUIS) 5 MG TABS tablet Take 1 tablet (5 mg total) by mouth every 12 (twelve) hours. 05/22/21  Yes [place] Eric J, DO  atorvastatin (LIPITOR) 40 MG tablet Take 1 tablet (40 mg total) by mouth daily at 6 PM. Patient taking differently: Take 40 mg by mouth at bedtime. 10/06/12  Yes Timmothy Euler, MD  bisacodyl (DULCOLAX) 5 MG EC tablet Take 5 mg by mouth daily as needed (constipation).   Yes [provider]  docusate sodium (COLACE) 100 MG capsule Take 100 mg by mouth daily.   Yes [provider]  feeding supplement, GLUCERNA SHAKE, (GLUCERNA SHAKE) LIQD Take 237 mLs by mouth 3 (three) times daily between meals.   Yes [provider]  furosemide (LASIX) 20 MG tablet Take 1 tablet (20 mg total) by mouth every other day. 07/15/21  Yes Vann, Jessica U, DO  gabapentin (NEURONTIN) 300 MG capsule Take 300 mg by mouth at bedtime.   Yes [provider]  guaiFENesin (ROBITUSSIN) 100 MG/5ML liquid Take 10 mLs by mouth every 4 (four) hours as needed for cough.   Yes [provider]  insulin lispro (HUMALOG) 100 UNIT/ML injection Inject 0-10 Units into the skin 4 (four) times daily -  with meals and at bedtime. 0-59 notify MD 60-150 0 units 151-199 2 units 200-249 4 units 250-296 6 units 300-349 8 units 350-399 10 units 400+ 10 untis and notify MD   Yes [provider]  Menthol, Topical Analgesic, (BIOFREEZE) 4 % GEL Apply 1 application topically 2 (two) times daily.   Yes [provider]  methocarbamol (ROBAXIN) 500 MG tablet Take 500 mg by mouth every 12 (twelve) hours.   Yes [provider]  midodrine (PROAMATINE) 10 MG tablet Take 1 tablet (10 mg total) by mouth 3 (three) times daily with meals. 05/22/21  Yes [place] Eric J, DO  ondansetron (ZOFRAN) 4 MG tablet Take 4 mg by mouth every 6 (six) hours as needed for nausea or vomiting.   Yes [provider]  oxyCODONE (OXY IR/ROXICODONE) 5 MG immediate release tablet Take 1 tablet (5 mg total) by mouth every 6 (six) hours as needed for severe pain. 07/14/21  Yes Eulogio Bear U, DO  ciprofloxacin (CIPRO) 500 MG tablet Take 500 mg by mouth every 12 (twelve) hours. Patient not taking: Reported on 08/07/2021    [provider]  insulin aspart (NOVOLOG) 100  UNIT/ML injection Inject 0-6 Units into the skin 3 (three) times daily with meals. Patient not taking: Reported on 08/07/2021 07/14/21   Geradine Girt, DO   Allergies  Allergen Reactions   [name] Allergy]  Nausea And Vomiting   Review of Systems Denies pain or shortness of breath, but poor historian overall  Physical Exam General: Sleepy but arousable, frail and chronically ill-appearing, answers questions intermittently but not reliably.   HEENT: No bruits, no goiter, no JVD Heart: Regular rate and rhythm. No murmur appreciated. Lungs: Fair air movement, Rales Abdomen: Soft, nontender, nondistended, positive bowel sounds.   Skin: Warm and dry  Vital Signs: BP (!) 108/49   Pulse 69   Temp 97.7 F (36.5 C) (Oral)   Resp 19   Ht 4' (1.219 m)   Wt 63.1 kg   SpO2 98%   BMI 42.45 kg/m  Pain Scale: 0-10 POSS *See Group Information*: S-Acceptable,Sleep, easy to arouse Pain Score: 3    SpO2: SpO2: 98 % O2 Device:SpO2: 98 % O2 Flow Rate: .O2 Flow Rate (L/min): 15 L/min  IO: Intake/output summary:  Intake/Output Summary (Last 24 hours) at 08/18/2021 2009 Last data filed at 08/18/2021 1800 Gross per 24 hour  Intake 930.6 ml  Output 1500 ml  Net -569.4 ml    LBM: Last BM Date: 08/11/21 Baseline Weight: Weight: 65.3 kg Most recent weight: Weight: 63.1 kg     Palliative Assessment/Data:   Flowsheet Rows    Flowsheet Row Most Recent Value  Intake Tab   Referral Department Hospitalist  Unit at Time of Referral ICU  Palliative Care Primary Diagnosis Cardiac  Date Notified 08/16/21  Palliative Care Type New Palliative care  Reason for referral Clarify Goals of Care  Date of Admission 07/25/2021  Date first seen by Palliative Care 08/18/21  # of days Palliative referral response time 2 Day(s)  # of days IP prior to Palliative referral 10  Clinical Assessment   Palliative Performance Scale Score 30%  Psychosocial & Spiritual Assessment   Palliative Care Outcomes    Patient/Family meeting held? Yes  Who was at the meeting? Son, daughter, daughter-in-law, sister       Time In: 11 Time Out: 1930 Time Total: 69 Greater than 50%  of this time was spent counseling and coordinating care related to the above assessment and plan.  Signed by: Micheline Rough, MD   Please contact Palliative Medicine Team phone at [phone] for questions and concerns.  For individual provider: See Shea Evans

## 2021-08-18 NOTE — TOC Progression Note (Signed)
Transition of Care [facility]) - Progression Note    Patient Details  Name: Ryan [name] MRN: [id number] Date of Birth: 19-Nov-1945  Transition of [facility]) CM/SW Contact  Ross Ludwig, [place] Phone Number: 08/18/2021, 7:30pm  Clinical Narrative:     Palliative met with patient and family today.  Patient's family have decided that they do not want patient to get dialysis anymore.  Plan was to transfer to [facility] for dialysis, per palliative note, family asked to cancel it, and palliative physician cancelled transfer.  Patient's family is considering home with hospice services if possible depending on what his oxygen needs are.  CSW to continue to follow patient's progress throughout discharge planning.   Expected Discharge Plan: Skilled Nursing Facility Barriers to Discharge: Other (must enter comment) (insurance authorization)  Expected Discharge Plan and Services Expected Discharge Plan: [place]   Discharge Planning Services: CM Consult   Living arrangements for the past 2 months: [place]                                       Social Determinants of Health (SDOH) Interventions    Readmission Risk Interventions Readmission Risk Prevention Plan 07/09/2021 04/15/2021  Transportation Screening Complete -  PCP or Specialist Appt within 5-7 Days Complete -  Home Care Screening Complete Complete  Medication Review (RN CM) Complete Complete  Some recent data might be hidden

## 2021-08-19 ENCOUNTER — Telehealth (HOSPITAL_COMMUNITY): Payer: Self-pay

## 2021-08-19 DIAGNOSIS — Z89611 Acquired absence of right leg above knee: Secondary | ICD-10-CM | POA: Diagnosis not present

## 2021-08-19 DIAGNOSIS — J9601 Acute respiratory failure with hypoxia: Secondary | ICD-10-CM | POA: Diagnosis not present

## 2021-08-19 DIAGNOSIS — Z7189 Other specified counseling: Secondary | ICD-10-CM | POA: Diagnosis not present

## 2021-08-19 DIAGNOSIS — I9589 Other hypotension: Secondary | ICD-10-CM | POA: Diagnosis not present

## 2021-08-19 DIAGNOSIS — I5023 Acute on chronic systolic (congestive) heart failure: Secondary | ICD-10-CM | POA: Diagnosis not present

## 2021-08-19 DIAGNOSIS — N179 Acute kidney failure, unspecified: Secondary | ICD-10-CM | POA: Diagnosis not present

## 2021-08-19 LAB — GLUCOSE, CAPILLARY: Glucose-Capillary: 81 mg/dL (ref 70–99)

## 2021-08-19 MED ORDER — HYDROMORPHONE HCL 1 MG/ML IJ SOLN
0.5000 mg | INTRAMUSCULAR | Status: DC | PRN
  Administered 2021-08-19 – 2021-08-20 (×6): 0.5 mg via INTRAVENOUS
  Filled 2021-08-19 (×6): qty 1

## 2021-08-19 MED ORDER — LIP MEDEX EX OINT
TOPICAL_OINTMENT | CUTANEOUS | Status: DC | PRN
  Filled 2021-08-19: qty 7

## 2021-08-19 MED ORDER — HALOPERIDOL LACTATE 5 MG/ML IJ SOLN
1.0000 mg | INTRAMUSCULAR | Status: DC | PRN
  Administered 2021-08-20: 1 mg via INTRAVENOUS
  Filled 2021-08-19: qty 1

## 2021-08-19 MED ORDER — LORAZEPAM 2 MG/ML IJ SOLN
0.5000 mg | INTRAMUSCULAR | Status: DC | PRN
  Administered 2021-08-20: 0.5 mg via INTRAVENOUS
  Filled 2021-08-19: qty 1

## 2021-08-19 NOTE — Progress Notes (Signed)
Progress Note  Patient Name: Ryan [name] Date of Encounter: 08/19/2021  [facility] Cardiologist: Ryan Grooms, MD  Heart failure: Dr. Aundra [name]  Subjective   Patient not responsive this AM, remains on nonrebreather. I spent 40 minutes talking to family, see below.  Inpatient Medications    Scheduled Meds:  amiodarone  200 mg Oral Daily   apixaban  5 mg Oral Q12H   Chlorhexidine Gluconate Cloth  6 each Topical Daily   insulin aspart  0-9 Units Subcutaneous TID WC   mouth rinse  15 mL Mouth Rinse BID   metolazone  10 mg Oral Q12H   midodrine  10 mg Oral TID WC    morphine injection  2 mg Intravenous Once   mupirocin ointment  1 application Nasal BID   sodium bicarbonate  650 mg Oral BID   Continuous Infusions:  ceFEPime (MAXIPIME) IV Stopped (08/18/21 1611)   furosemide 66 mL/hr at 08/19/21 0900   PRN Meds: acetaminophen, alum & mag hydroxide-simeth, bisacodyl, glycopyrrolate, guaiFENesin, HYDROmorphone (DILAUDID) injection, ipratropium-albuterol, lip balm, LORazepam, ondansetron **OR** ondansetron (ZOFRAN) IV, senna-docusate   Vital Signs    Vitals:   08/19/21 0700 08/19/21 0730 08/19/21 0800 08/19/21 0900  BP: (!) 90/37  (!) 93/38 (!) 86/36  Pulse: 71 70 75 70  Resp: 17 16 19 16   Temp:      TempSrc:      SpO2: 92% 92% 92% 90%  Weight:      Height:        Intake/Output Summary (Last 24 hours) at 08/19/2021 0953 Last data filed at 08/19/2021 0900 Gross per 24 hour  Intake 739.08 ml  Output 1350 ml  Net -610.92 ml   Last 3 Weights 08/19/2021 08/18/2021 08/17/2021  Weight (lbs) 141 lb 5 oz 139 lb 1.8 oz 139 lb 1.8 oz  Weight (kg) 64.1 kg 63.1 kg 63.1 kg      Telemetry    SR - Personally Reviewed  ECG    No new since 08/12/21 - Personally Reviewed  Physical Exam   GEN: Resting, appears comfortable on nonrebreather Neck: JVD elevated above ear Cardiac: RRR, no murmurs, rubs, or gallops.  Respiratory: diffusely coarse GI: Soft,  nontender, non-distended  MS: bilateral AKA Neuro:  nonresponsive  Labs    High Sensitivity Troponin:   Recent Labs  Lab 08/12/21 1109  TROPONINIHS 18*     Chemistry Recent Labs  Lab 08/16/21 0526 08/16/21 1622 08/17/21 0308 08/18/21 0306  NA 135  --  136 137  K 5.0  --  4.9 4.7  CL 107  --  107 107  CO2 19*  --  20* 20*  GLUCOSE 79  --  68* 100*  BUN 52*  --  54* 56*  CREATININE 2.92*  --  3.21* 3.02*  CALCIUM 8.1*  --  7.9* 8.1*  PROT 5.9* 5.8* 5.5*  --   ALBUMIN 2.2* 2.0* 1.9*  --   AST 495* 500* 422*  --   ALT 232* 235* 218*  --   ALKPHOS 173* 165* 159*  --   BILITOT 1.3* 1.6* 1.7*  --   GFRNONAA 21*  --  19* 21*  ANIONGAP 9  --  9 10    Lipids No results for input(s): CHOL, TRIG, HDL, LABVLDL, LDLCALC, CHOLHDL in the last 168 hours.  Hematology Recent Labs  Lab 08/14/21 0949  WBC 8.0  RBC 4.53  HGB 10.1*  HCT 32.8*  MCV 72.4*  MCH 22.3*  MCHC 30.8  RDW 27.3*  PLT 205   Thyroid No results for input(s): TSH, FREET4 in the last 168 hours.  BNP Recent Labs  Lab 08/13/21 1010  BNP >4,500.0*    DDimer No results for input(s): DDIMER in the last 168 hours.   Radiology    DG CHEST PORT 1 VIEW  Result Date: 08/18/2021 CLINICAL DATA:  Respiratory failure EXAM: PORTABLE CHEST 1 VIEW COMPARISON:  08/17/2021 FINDINGS: Previous median sternotomy and mitral valve replacement. Pacemaker/AICD in place. Slight further worsening of diffuse pulmonary density consistent with edema. Enlarging effusions, right more than left. IMPRESSION: Worsening of effusions and diffuse pulmonary density most consistent with congestive heart failure. Electronically Signed   By: Ryan [name] M.D.   On: 08/18/2021 08:06    Cardiac Studies   Echo 07/10/21 reviewed  Patient Profile     75 y.o. male with a hx of HFrEF w/ bivent failure and EF generally approx 25%, status post MDT ICD, HTN, DM II,  bilateral AKA's, DVT/PE, A. fib on Eliquis, PAD s/p R-fem-pop, history of  endocarditis s/p mitral valve replacement in 2014.  We were consulted 08/18/21 for worsening volume overload.  Assessment & Plan    Acute on chronic systolic and diastolic heart failure End stage heart failure Acute hypoxemic respiratory failure Kidney failure Chronic hypotension Goals of care -see my discussion with patient and family yesterday -greatly appreciate palliative care assistance -I have talked to the [employer] representative and will have his ICD therapies turned off today -I've updated Dr. Aundra [name].  After talking to family and the other medical providers on the team, I agree with transition to comfort care once his ICD therapies are off. I discussed this extensively with the family at bedside and in the waiting room. They are going to update all of the other family members and tell the team when they are ready to start comfort care measures.  Given transition to comfort care, we will formally sign off, but we are available at any time for questions or concerns, and I will follow peripherally.  Thank you for allowing [place] to participate in Ryan [name]' care.  Time Spent with Patient: I have spent a total of 45 with patient and family reviewing hospital notes, telemetry, EKGs, labs and examining the patient as well as establishing an assessment and plan that was discussed with the patient.  > 50% of time was spent in direct patient care.   For questions or updates, please contact Ryan [name] Please consult [email or web address] for contact info under        Signed, Ryan Dresser, MD  08/19/2021, 9:53 AM

## 2021-08-19 NOTE — Progress Notes (Signed)
Plan now is for comfort care, no HD, full DNR.   Will sign off.   Kelly Splinter, MD 08/19/2021, 11:51 AM

## 2021-08-19 NOTE — Plan of Care (Signed)
  Problem: Nutrition: Goal: Adequate nutrition will be maintained Outcome: Not Progressing   

## 2021-08-19 NOTE — Progress Notes (Signed)
Daily Progress Note   Patient Name: Ryan [name]       Date: 08/19/2021 DOB: 07/27/1946  Age: 75 y.o. MRN#: [id number] Attending Physician: Georgette Shell, MD Primary Care Physician: [name], MD Admit Date: 07/25/2021  Reason for Consultation/Follow-up: Establishing goals of care  Subjective: I followed up with Ryan [name] and his family this morning.  He was lying in bed in no distress at time of my encounter.  Nursing reports that he was experiencing shortness of breath this morning and received dose of Dilaudid that improved his symptoms.  I spoke with family including his son, daughter-in-law, and sister.  We discussed clinical course and rviewed difference between a aggressive medical intervention path and a palliative, comfort focused care path.  I reviewed with him my conversation with Dr. Harrell Gave with recommendation to deactivate ICD.  Family expressed wanting to ensure that Ryan [name] does not suffer as he approaches end-of-life.  Per their request, I reached out to the rest of his care team and there is agreement that comfort care would be the consensus recommendation moving forward.   Questions and concerns addressed.   PMT will continue to support holistically.  Length of Stay: 7  Current Medications: Scheduled Meds:   Chlorhexidine Gluconate Cloth  6 each Topical Daily   mouth rinse  15 mL Mouth Rinse BID    morphine injection  2 mg Intravenous Once    Continuous Infusions:   PRN Meds: alum & mag hydroxide-simeth, bisacodyl, glycopyrrolate, guaiFENesin, haloperidol lactate, HYDROmorphone (DILAUDID) injection, lip balm, LORazepam, [DISCONTINUED] ondansetron **OR** ondansetron (ZOFRAN) IV  Physical Exam   General: Sleepy but arousable, frail and  chronically ill-appearing HEENT: No bruits, no goiter, no JVD Heart: Regular rate and rhythm. No murmur appreciated. Lungs: Fair air movement, Rales Abdomen: Soft, nontender, nondistended, positive bowel sounds.   Skin: Warm and dry        Vital Signs: BP (!) 87/50 (BP Location: Left Arm)   Pulse 73   Temp 98 F (36.7 C) (Axillary)   Resp 18   Ht 4' (1.219 m)   Wt 64.1 kg   SpO2 92%   BMI 43.12 kg/m  SpO2: SpO2: 92 % O2 Device: O2 Device: High Flow Nasal Cannula O2 Flow Rate: O2 Flow Rate (L/min): 15 L/min  Intake/output summary:  Intake/Output Summary (  Last 24 hours) at 08/19/2021 1845 Last data filed at 08/19/2021 1000 Gross per 24 hour  Intake 273.24 ml  Output 500 ml  Net -226.76 ml   LBM: Last BM Date: 08/11/21 Baseline Weight: Weight: 65.3 kg Most recent weight: Weight: 64.1 kg       Palliative Assessment/Data:    Flowsheet Rows    Flowsheet Row Most Recent Value  Intake Tab   Referral Department Hospitalist  Unit at Time of Referral ICU  Palliative Care Primary Diagnosis Cardiac  Date Notified 08/16/21  Palliative Care Type New Palliative care  Reason for referral Clarify Goals of Care  Date of Admission 07/23/2021  Date first seen by Palliative Care 08/18/21  # of days Palliative referral response time 2 Day(s)  # of days IP prior to Palliative referral 10  Clinical Assessment   Palliative Performance Scale Score 30%  Psychosocial & Spiritual Assessment   Palliative Care Outcomes   Patient/Family meeting held? Yes  Who was at the meeting? Son, daughter, daughter-in-law, sister       Patient Active Problem List   Diagnosis Date Noted   Acute respiratory failure with hypoxia ([place]) 08/17/2021   Elevated transaminase level 08/13/2021   Acute on chronic systolic CHF (congestive heart failure) ([place]) 08/12/2021   Bilateral pleural effusion 08/11/2021   S/P AKA (above knee amputation) bilateral ([place]) 08/10/2021   Elevated LFTs 08/10/2021   Hematuria  08/08/2021   History of venous thromboembolism 08/08/2021   Chronic hypotension 08/08/2021   Dyslipidemia 08/08/2021   Altered mental status 07/01/2021   Multiple pulmonary nodules 03/15/9484   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[place]) 06/06/2021   Normocytic anemia 06/06/2021   DVT of left external iliac vein 04/2021 06/06/2021   Pulmonary emboli 04/2021 06/06/2021   Pressure ulcer 04/22/2021   Popliteal artery occlusion, right ([place]) 03/06/2021   PAD (peripheral artery disease) ([place]) 03/06/2021   Acute on chronic combined systolic and diastolic CHF (congestive heart failure) ([place]) 09/06/2020   CHF (congestive heart failure) ([place]) 08/26/2020   Acute combined systolic and diastolic CHF, NYHA class 3 ([place])    Stage 3 chronic kidney disease ([place]) - baseline SCr 1.1    Onychomycosis of toenail 04/23/2020   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   PVC's (premature ventricular contractions) 06/12/2016   Junctional tachycardia ([place]) 09/16/2015   AKI (acute kidney injury) ([place]) 08/30/2015   SVT (supraventricular tachycardia)  long RP    Atherosclerosis of native arteries of the extremities with ulceration ([place]) 10/23/2012   Essential hypertension 09/29/2012   Type 2 diabetes mellitus ([place]) 09/28/2012   Status post mitral valve replacement with bioprosthetic valve 09/19/2012   AF (paroxysmal atrial fibrillation) ([place]) 46/27/0350   Chronic systolic heart failure (HCC) - LVEF 25 to 30% 09/16/2012   Peripheral vascular disease ([place]) 09/15/2012   Sepsis with acute renal failure without septic shock ([place]) 09/15/2012   Acute ischemic stroke ([place]) 09/15/2012    Palliative Care Assessment & Plan   Patient Profile: 75 y.o. male  with past medical history of Heart failure with reduced EF of 25% status post ICD, hypertension, diabetes, peripheral artery disease, bilateral AKA's, DVT, A. fib on Eliquis, CABG admitted on 08/05/2021 with acute on chronic congestive heart failure  complicated by bilateral pleural effusions with respiratory failure with hypoxia and AKI.  His situation has remained difficult due to low blood pressure and poor candidate for dialysis.  Palliative consulted for goals of care.  Recommendations/Plan: Plan for full comfort moving forward.  We discussed symptom  management and recommendations for medications to ensure adequate control of any pain, shortness of breath, nausea, or agitation. Family would like to keep current oxygen of nonrebreather as well as nasal cannula.  They are hoping to have other family come and visit with him today.  Discussed there may come a point in time where may want to consider titrating down his oxygen as it can be very drying to have on so much flow.  Goals of Care and Additional Recommendations: Limitations on Scope of Treatment: Full Comfort Care  Code Status:    Code Status Orders  (From admission, onward)           Start     Ordered   08/18/21 1914  Do not attempt resuscitation (DNR)  Continuous       Question Answer Comment  In the event of cardiac or respiratory ARREST Do not call a "code blue"   In the event of cardiac or respiratory ARREST Do not perform Intubation, CPR, defibrillation or ACLS   In the event of cardiac or respiratory ARREST Use medication by any route, position, wound care, and other measures to relive pain and suffering. May use oxygen, suction and manual treatment of airway obstruction as needed for comfort.      08/18/21 1913           Code Status History     Date Active Date Inactive Code Status Order ID Comments User Context   08/07/2021 0129 08/18/2021 1913 Full Code [id number]  Etta Quill, DO ED   07/01/2021 2342 07/15/2021 0245 Full Code [id number]  Kristopher Oppenheim, DO Inpatient   06/06/2021 2206 06/08/2021 0445 Full Code [id number]  Reubin Milan, MD ED   04/08/2021 1212 05/23/2021 0857 Full Code [id number]  Karmen Bongo, MD Inpatient   03/06/2021 1816 03/13/2021  0325 Full Code [id number]  Vaughan Basta [address], PA-C Inpatient   02/25/2021 1202 02/25/2021 2000 Full Code [id number]  Cherre Robins, MD Inpatient   01/30/2021 1513 01/30/2021 2320 Full Code [id number]  Larey Dresser, MD Inpatient   08/26/2020 1818 09/06/2020 2203 Full Code [id number]  Ledora Bottcher, [place] Inpatient   06/11/2016 1627 06/12/2016 1608 Full Code [id number]  Constance Haw, MD Inpatient   08/30/2015 0114 08/30/2015 1540 Full Code [id number]  Edwin Dada, MD Inpatient   09/29/2012 1803 10/06/2012 2104 Full Code [id number]  Cephus Richer, RN Inpatient   09/26/2012 1331 09/29/2012 1803 Full Code [id number]  Lenox Ahr, RN Inpatient       Prognosis:  Hours - Days  Discharge Planning: Anticipated Hospital Death  Care plan was discussed with family, bedside RN, Dr. Jonnie Finner, Dr. Harrell Gave, and Dr. Rodena Piety  Thank you for allowing the Palliative Medicine Team to assist in the care of this patient.   Total Time 50 Prolonged Time Billed No      Greater than 50%  of this time was spent counseling and coordinating care related to the above assessment and plan.  Micheline Rough, MD  Please contact Palliative Medicine Team phone at [phone] for questions and concerns.

## 2021-08-19 NOTE — Progress Notes (Signed)
Pharmacy Antibiotic Note  Ryan [name] is a 75 y.o. male admitted on 07/28/2021. Pharmacy has been consulted for cefepime dosing for UTI. Pt has been on cefadroxil since 11/21, antibiotics being broadened today.  5 days Cefadroxil po completed 11/25 > broadened to Cefepime 11/26 SCr on admit 1.69 (11/17) >> started increasing 11/24 >> peak 3.21 (11/28) > 3.02  Today, 08/19/21 D10 abx    Plan: Cefepime 2 g IV q24h Monitor renal function for necessary dose adjustments  Height: 4' (121.9 cm) Weight: 64.1 kg (141 lb 5 oz) IBW/kg (Calculated) : 22.4  Temp (24hrs), Avg:98.2 F (36.8 C), Min:97.5 F (36.4 C), Max:99.4 F (37.4 C)  Recent Labs  Lab 08/14/21 0949 08/15/21 0857 08/16/21 0526 08/17/21 0308 08/18/21 0306  WBC 8.0  --   --   --   --   CREATININE 2.32* 2.66* 2.92* 3.21* 3.02*     Estimated Creatinine Clearance: 11.3 mL/min (A) (by C-G formula based on SCr of 3.02 mg/dL (H)).    Allergies  Allergen Reactions   Tuna [Fish Allergy] Nausea And Vomiting    Antimicrobials this admission: cefepime 11/26 >>  cefadroxil 11/21 >> 11/26  Dose adjustments this admission:  Microbiology results: 11/19 MRSA PCR: Positive 11/25 UCx: NG-Final 11/27 pleural fluidCx: no organism gram stain  Thank you for allowing pharmacy to be a part of this patient's care.  Minda Ditto, PharmD 08/19/2021 10:52 AM

## 2021-08-19 NOTE — Telephone Encounter (Signed)
Called to schedule nephrostogram, no answer, left vm. AW

## 2021-08-19 NOTE — Progress Notes (Signed)
PROGRESS NOTE    Ryan [name]  SWH:[id number] DOB: 1946/08/12 DOA: 08/18/2021 PCP: [name], MD   Brief Narrative: Ryan [name] is a 75 y.o. male with a history of HFrEF and EF of 25% status post ICD, hypertension, diabetes mellitus type 2, peripheral artery disease, bilateral AKA's, DVT/PE, A. fib on Eliquis, CABG.  Patient presented secondary to dislodged nephrostomy tube, status postexchange.  Patient also noted to have evidence of volume overload and resultant AKI.  Patient started on IV diuresis and now with decreased urine output.  Assessment & Plan:   Principal Problem:   AKI (acute kidney injury) ([place]) Active Problems:   AF (paroxysmal atrial fibrillation) (HCC)   Type 2 diabetes mellitus (HCC)   Pressure ulcer   DVT of left external iliac vein 04/2021   Pulmonary emboli 04/2021   Hematuria   History of venous thromboembolism   Chronic hypotension   Dyslipidemia   S/P AKA (above knee amputation) bilateral (HCC)   Elevated LFTs   Bilateral pleural effusion   Acute on chronic systolic CHF (congestive heart failure) (HCC)   Elevated transaminase level   Acute respiratory failure with hypoxia ([place])   #1 AKI renal ultrasound with no hydronephrosis.  Patient now comfort care.  #2 CHF acute on chronic systolic with AICD in place to be turned off today appreciate cardiology assistance.  #3 acute hypoxic respiratory failure due to CHF/end-stage heart failure  #4 hypotension chronic but worse on midodrine  #5 PE on Eliquis  #6 DVT on Eliquis  #7 pressure ulcer bilateral buttocks present on admission  #8 type 2 diabetes on insulin  #9 paroxysmal atrial fibrillation on amiodarone and Eliquis  #10 malfunction of nephrostomy tube  #11 goals of care appreciate palliative care input patient now full comfort care and DNR.  Pressure Injury 07/08/21 Buttocks Left Stage 2 -  Partial thickness loss of dermis presenting as a shallow open injury with a red, pink wound  bed without slough. (Active)  07/08/21 0815  Location: Buttocks  Location Orientation: Left  Staging: Stage 2 -  Partial thickness loss of dermis presenting as a shallow open injury with a red, pink wound bed without slough.  Wound Description (Comments):   Present on Admission:      Pressure Injury 08/07/21 Buttocks Right;Left Stage 2 -  Partial thickness loss of dermis presenting as a shallow open injury with a red, pink wound bed without slough. (Active)  08/07/21 1949  Location: Buttocks  Location Orientation: Right;Left  Staging: Stage 2 -  Partial thickness loss of dermis presenting as a shallow open injury with a red, pink wound bed without slough.  Wound Description (Comments):   Present on Admission: Yes     Estimated body mass index is 43.12 kg/m as calculated from the following:   Height as of this encounter: 4' (1.219 m).   Weight as of this encounter: 64.1 kg.  DVT prophylaxis: Comfort care Code Status: DNR Family Communication: None at bedside  disposition Plan:  Status is: Inpatient  Remains inpatient appropriate because: ?  Anticipated hospital death    Consultants:  Cardiology, palliative care, renal  Procedures: Antimicrobials: None  Subjective: UNRESPONSIVE ON NRB  Objective: Vitals:   08/19/21 0730 08/19/21 0800 08/19/21 0900 08/19/21 1200  BP:  (!) 93/38 (!) 86/36 (!) 87/50  Pulse: 70 75 70 74  Resp: 16 19 16  (!) 27  Temp:  98 F (36.7 C)    TempSrc:  Axillary    SpO2: 92% 92%  90% 93%  Weight:      Height:        Intake/Output Summary (Last 24 hours) at 08/19/2021 1450 Last data filed at 08/19/2021 1000 Gross per 24 hour  Intake 501.2 ml  Output 900 ml  Net -398.8 ml   Filed Weights   08/17/21 0500 08/18/21 0500 08/19/21 0500  Weight: 63.1 kg 63.1 kg 64.1 kg    Examination:  General exam: Appears calm and comfortable  Respiratory system: Clear to auscultation. Respiratory effort normal. Cardiovascular system: S1 & S2 heard,  RRR. No JVD, murmurs, rubs, gallops or clicks. No pedal edema. Gastrointestinal system: Abdomen is nondistended, soft and nontender. No organomegaly or masses felt. Normal bowel sounds heard. Central nervous system: Alert and oriented. No focal neurological deficits. Extremities: Symmetric 5 x 5 power. Skin: No rashes, lesions or ulcers Psychiatry: Judgement and insight appear normal. Mood & affect appropriate.     Data Reviewed: I have personally reviewed following labs and imaging studies  CBC: Recent Labs  Lab 08/14/21 0949  WBC 8.0  HGB 10.1*  HCT 32.8*  MCV 72.4*  PLT 601   Basic Metabolic Panel: Recent Labs  Lab 08/14/21 0949 08/15/21 0857 08/16/21 0526 08/17/21 0308 08/18/21 0306  NA 135 137 135 136 137  K 4.9 5.3* 5.0 4.9 4.7  CL 108 108 107 107 107  CO2 19* 18* 19* 20* 20*  GLUCOSE 109* 80 79 68* 100*  BUN 45* 49* 52* 54* 56*  CREATININE 2.32* 2.66* 2.92* 3.21* 3.02*  CALCIUM 8.0* 8.1* 8.1* 7.9* 8.1*   GFR: Estimated Creatinine Clearance: 11.3 mL/min (A) (by C-G formula based on SCr of 3.02 mg/dL (H)). Liver Function Tests: Recent Labs  Lab 08/13/21 1010 08/15/21 0857 08/16/21 0526 08/16/21 1622 08/17/21 0308  AST 273* 442* 495* 500* 422*  ALT 123* 203* 232* 235* 218*  ALKPHOS 143* 163* 173* 165* 159*  BILITOT 1.0 1.2 1.3* 1.6* 1.7*  PROT 6.1* 6.0* 5.9* 5.8* 5.5*  ALBUMIN 2.3* 2.3* 2.2* 2.0* 1.9*   No results for input(s): LIPASE, AMYLASE in the last 168 hours. No results for input(s): AMMONIA in the last 168 hours. Coagulation Profile: No results for input(s): INR, PROTIME in the last 168 hours. Cardiac Enzymes: No results for input(s): CKTOTAL, CKMB, CKMBINDEX, TROPONINI in the last 168 hours. BNP (last 3 results) Recent Labs    08/25/20 1121 03/27/21 1123  PROBNP 21,395* 3,146*   HbA1C: No results for input(s): HGBA1C in the last 72 hours. CBG: Recent Labs  Lab 08/18/21 0918 08/18/21 1136 08/18/21 1738 08/18/21 2115  08/19/21 0754  GLUCAP 94 105* 118* 94 81   Lipid Profile: No results for input(s): CHOL, HDL, LDLCALC, TRIG, CHOLHDL, LDLDIRECT in the last 72 hours. Thyroid Function Tests: No results for input(s): TSH, T4TOTAL, FREET4, T3FREE, THYROIDAB in the last 72 hours. Anemia Panel: No results for input(s): VITAMINB12, FOLATE, FERRITIN, TIBC, IRON, RETICCTPCT in the last 72 hours. Sepsis Labs: No results for input(s): PROCALCITON, LATICACIDVEN in the last 168 hours.  Recent Results (from the past 240 hour(s))  Urine Culture     Status: None   Collection Time: 08/14/21 12:56 PM   Specimen: In/Out Cath Urine  Result Value Ref Range Status   Specimen Description   Final    IN/OUT CATH URINE Performed at [facility] [address] [place], [place] [address]    Special Requests   Final    NONE Performed at [facility], [address] [place],  [place] [address]    Culture   Final    NO GROWTH Performed at [facility], [place] [address] [place], [place] [address]    Report Status 08/15/2021 FINAL  Final  Gram stain     Status: None   Collection Time: 08/16/21 10:30 AM   Specimen: Fluid  Result Value Ref Range Status   Specimen Description FLUID  Final   Special Requests  PLEURAL, RIGHT  Final   Gram Stain   Final    WBC PRESENT,BOTH PMN AND MONONUCLEAR NO ORGANISMS SEEN CYTOSPIN SMEAR Performed at [facility], [place] [address] [place], [place] [address]    Report Status 08/17/2021 FINAL  Final  Culture, body fluid w Gram Stain-bottle     Status: None (Preliminary result)   Collection Time: 08/16/21 10:30 AM   Specimen: Fluid  Result Value Ref Range Status   Specimen Description FLUID  Final   Special Requests  PLEURAL, RIGHT  Final   Culture   Final    NO GROWTH 3 DAYS Performed at [facility], [place] [address] [place], [place] [address]    Report Status PENDING  Incomplete  MRSA Next Gen by PCR, Nasal     Status:  Abnormal   Collection Time: 08/16/21  6:35 PM   Specimen: Nasal Mucosa; Nasal Swab  Result Value Ref Range Status   MRSA by PCR Next Gen DETECTED (A) NOT DETECTED Final    Comment: RESULT CALLED TO, READ BACK BY AND VERIFIED WITH: GRIFFIN,C. 08/16/21 @2241  BY SEEL,M. (NOTE) The GeneXpert MRSA Assay (FDA approved for NASAL specimens only), is one component of a comprehensive MRSA colonization surveillance program. It is not intended to diagnose MRSA infection nor to guide or monitor treatment for MRSA infections. Test performance is not FDA approved in patients less than 47 years old. Performed at [facility], [place] [address] [place], [place] [address]          Radiology Studies: DG CHEST PORT 1 VIEW  Result Date: 08/18/2021 CLINICAL DATA:  Respiratory failure EXAM: PORTABLE CHEST 1 VIEW COMPARISON:  08/17/2021 FINDINGS: Previous median sternotomy and mitral valve replacement. Pacemaker/AICD in place. Slight further worsening of diffuse pulmonary density consistent with edema. Enlarging effusions, right more than left. IMPRESSION: Worsening of effusions and diffuse pulmonary density most consistent with congestive heart failure. Electronically Signed   By: Nelson Chimes M.D.   On: 08/18/2021 08:06        Scheduled Meds:  Chlorhexidine Gluconate Cloth  6 each Topical Daily   mouth rinse  15 mL Mouth Rinse BID    morphine injection  2 mg Intravenous Once   Continuous Infusions:   LOS: 7 days    Georgette Shell, MD 08/19/2021, 2:50 PM

## 2021-08-20 DIAGNOSIS — J9601 Acute respiratory failure with hypoxia: Secondary | ICD-10-CM | POA: Diagnosis not present

## 2021-08-20 DIAGNOSIS — N179 Acute kidney failure, unspecified: Secondary | ICD-10-CM | POA: Diagnosis not present

## 2021-08-20 DIAGNOSIS — Z515 Encounter for palliative care: Secondary | ICD-10-CM | POA: Diagnosis not present

## 2021-08-20 DIAGNOSIS — I5023 Acute on chronic systolic (congestive) heart failure: Secondary | ICD-10-CM | POA: Diagnosis not present

## 2021-08-20 MED ORDER — HYDROMORPHONE HCL 1 MG/ML IJ SOLN
0.5000 mg | INTRAMUSCULAR | Status: DC | PRN
Start: 2021-08-20 — End: 2021-08-21

## 2021-08-20 MED ORDER — HALOPERIDOL LACTATE 5 MG/ML IJ SOLN: 2.0000 mg | INTRAMUSCULAR | Status: DC | PRN

## 2021-08-20 MED ORDER — LORAZEPAM 2 MG/ML IJ SOLN: 1.0000 mg | INTRAMUSCULAR | Status: DC | PRN

## 2021-08-20 MED ORDER — SODIUM CHLORIDE 0.9 % IV SOLN: INTRAVENOUS | Status: DC | PRN

## 2021-08-20 DEATH — deceased

## 2021-08-21 LAB — CULTURE, BODY FLUID W GRAM STAIN -BOTTLE: Culture: NO GROWTH

## 2021-09-20 NOTE — Progress Notes (Signed)
Chaplain responded to request for family support following the death of the patient.  Several family members have gathered and more on the way.  Chaplain offered space for family to share and spoke of the patient's quiet ways and gentle spirit.  Family is grieving as patient has come back from other health issues and they were hoping he would make it home.  Chaplain prayed with the family.  Chaplain will connect with family and patient placement card if unsure about a funeral home. [place], [place].   08/31/21 2000  Clinical Encounter Type  Visited With Patient and family together;Health care provider  Visit Type Death  Referral From Family;Nurse  Consult/Referral To Chaplain  Spiritual Encounters  Spiritual Needs Grief support;Prayer

## 2021-09-20 NOTE — Progress Notes (Signed)
Chaplain engaged in an initial visit with Riccardo, his children, daughter-in-law, and sister.  Chaplain learned that Duong is a quiet introvert who his family described as a "man of few words."  He has been an active member in his church serving with [employer] of music and had a strong relationship with God. He retired from [employer].  From what Chaplain gathered, Peyten is the father of two sons and one daughter.  He was born in [place], [place] where he still attended church.  His former pastor was able to come and pray over him yesterday.    Chaplain and family prayed over [place].  Chaplain affirmed the ways they have loved and covered him.    Chaplain is available to follow-up as needed.     2021/09/01 1300  Clinical Encounter Type  Visited With Patient and family together  Visit Type Initial;Spiritual support  Spiritual Encounters  Spiritual Needs Prayer

## 2021-09-20 NOTE — Discharge Summary (Addendum)
Death Summary  Ryan [name] DPO:[id number] DOB: [birth year] DOA: 08-18-2021  PCP: Ryan Nakai, MD  Admit date: Aug 18, 2021 Date of Death: 2021/09/01 Time of Death: 19:25 Notification: Ryan Nakai, MD notified of death of September 05, 2021   History of present illness:  Ryan [name] is a 76 y.o. 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 fib on Eliquis, PAD, history of right femoropopliteal bypass, endocarditis, mitral valve replacement in 2014 patient was admitted with volume overload acute on chronic CHF exacerbation and kidney failure.  Patient has chronic hypotension and remained hypotensive and was deemed not a dialysis candidate.  He was seen in consultation by cardiology nephrology and palliative care.  Patient was treated with high-dose Lasix with no urine output or minimal urine output.  He had bilateral pleural effusion and worsening hypoxia.  He had thoracentesis done without much improvement.  Patient was made comfort care prior to passing away after long discussion with palliative care patient's family cardiology and nephrology.  Patient passed away peacefully at 85 with family present.  Final Diagnoses:  1.   End-stage heart failure 2    Kidney failure 3    atrial fibrillation   The results of significant diagnostics from this hospitalization (including imaging, microbiology, ancillary and laboratory) are listed below for reference.    Significant Diagnostic Studies: DG Chest 1 View  Result Date: 08/16/2021 CLINICAL DATA:  76 year old male status post thoracentesis. EXAM: CHEST  1 VIEW COMPARISON:  Chest x-ray 08/15/2021. FINDINGS: Substantial decreased size of what is now a small right pleural effusion. No postprocedural pneumothorax. Persistent small to moderate left pleural effusion. Patchy areas of interstitial prominence and multifocal airspace disease noted in the lungs  bilaterally (left greater than right). Heart size is mildly enlarged. Upper mediastinal contours are within normal limits allowing for patient positioning. Atherosclerotic calcifications in the thoracic aorta. Status post median sternotomy for mitral valve replacement. Left-sided pacemaker/AICD in place with lead tip projecting over the expected location of the right ventricular apex. IMPRESSION: 1. Decreased size of what is now a small right pleural effusion following thoracentesis. No postprocedural pneumothorax or other acute complicating features. 2. The appearance of the lungs is suggestive of multilobar bilateral pneumonia (left greater than right), as above. 3. Persistent small to moderate left pleural effusion. 4. Mild cardiomegaly. 5. Aortic atherosclerosis. Electronically Signed   By: Vinnie Langton M.D.   On: 08/16/2021 09:16   CT CHEST WO CONTRAST  Result Date: 08/15/2021 CLINICAL DATA:  Pleural effusion. EXAM: CT CHEST WITHOUT CONTRAST TECHNIQUE: Multidetector CT imaging of the chest was performed following the standard protocol without IV contrast. COMPARISON:  Earlier same day radiograph at 1050 hours. FINDINGS: Cardiovascular: Moderate cardiomegaly. No pericardial effusion. Left chest wall AICD leads within the right atrium and right ventricle. Mitral valve replacement. Postoperative changes of median sternotomy and coronary artery bypass graft. Scattered calcific atherosclerosis of the thoracic aorta. Main pulmonary artery measures 3.5 cm in diameter. Mediastinum/Nodes: No enlarged mediastinal or axillary lymph nodes. Thyroid gland, trachea, and esophagus demonstrate no significant findings. Lungs/Pleura: No pneumothorax. Large right pleural effusion with near complete atelectatic collapse of the right lower lobe. Small left pleural effusion with compressive atelectasis at the left lung base. Perihilar patchy ground-glass opacities with associated septal thickening, left-greater-than-right.  Upper Abdomen: No acute abnormality. Musculoskeletal: No chest wall mass or suspicious bone lesions identified. IMPRESSION: 1. No pneumothorax as questioned on earlier same day radiograph. 2.  Moderate cardiomegaly with dilatation of the main pulmonary artery up to 3.5 cm in diameter, suggestive of pulmonary artery hypertension. 3. Large right and small left pleural effusions. There is near complete atelectatic collapse of the right lower lobe. No focal consolidation suggestive of pneumonia. Electronically Signed   By: Ileana Roup M.D.   On: 08/15/2021 14:57   US RENAL  Result Date: 08/14/2021 CLINICAL DATA:  Oliguria EXAM: RENAL / URINARY TRACT ULTRASOUND COMPLETE COMPARISON:  CT 08/11/2021.  Ultrasound 08/09/2021. FINDINGS: Right Kidney: Renal measurements: 10.0 x 3.5 x 6.2 cm = volume: 112 mL. Echogenicity within normal limits. No mass or hydronephrosis visualized. Left Kidney: Renal measurements: 10.5 x 4.4 x 3.5 cm = volume: 83 mL. Left nephrostomy catheter in place. No hydronephrosis. No mass. Bladder: Layering debris noted in the bladder. Other: Mild ascites and left pleural effusion noted. IMPRESSION: Left nephrostomy catheter in place.  No hydronephrosis. Mild ascites and left pleural effusion. Electronically Signed   By: Rolm Baptise M.D.   On: 08/14/2021 10:47   US RENAL  Result Date: 08/09/2021 CLINICAL DATA:  Acute kidney injury, history of left nephrostomy catheter related to hydronephrosis from a left retroperitoneal hematoma. EXAM: RENAL / URINARY TRACT ULTRASOUND COMPLETE COMPARISON:  08/07/2021, 07/02/2021 FINDINGS: Right Kidney: Renal measurements: 10.2 x 3.9 x 4.9 cm = volume: 102 mL. No mass or hydronephrosis. Slight increased cortical echogenicity compatible with medical renal disease. No acute finding. Left Kidney: Renal measurements: 10.4 x 5.0 x 4.8 cm = volume: 132 mL. Limited visualization of the left kidney related to patient breathing, positioning, obscuring bowel gas. No  significant left hydronephrosis. Bladder: Not well distended. Ureteral jets demonstrated. Urinary sediment or debris noted dependently within the bladder. Other: Small amount of abdominopelvic ascites noted. IMPRESSION: Negative for hydronephrosis. Limited assessment of the left kidney as above. Small amount of abdominopelvic ascites Electronically Signed   By: Jerilynn Mages.  Shick M.D.   On: 08/09/2021 08:39   IR Nephrostomy Tube Change  Result Date: 08/07/2021 INDICATION: 76 year old with left percutaneous nephrostomy tube that was damaged and needs replacement. Elevated creatinine level. History of left ureter obstruction secondary to retroperitoneal hematoma. EXAM: LEFT NEPHROSTOMY TUBE EXCHANGE WITH FLUOROSCOPY MEDICATIONS: Local anesthetic, 1% lidocaine ANESTHESIA/SEDATION: None CONTRAST:  10 mL Omnipaque 300-administered into the collecting system(s) FLUOROSCOPY TIME:  Fluoroscopy Time: 1 minute, 30 seconds, 10 mGy COMPLICATIONS: None immediate. PROCEDURE: Patient was placed prone. The left flank was prepped and draped in sterile fashion. Maximal barrier sterile technique was utilized including caps, mask, sterile gowns, sterile gloves, sterile drape, hand hygiene and skin antiseptic. Nephrostomy was not present. Kumpe catheter was easily advanced through the nephrostomy tube hole into the renal pelvis using fluoroscopy. Contrast injection confirmed placement in the renal collecting system. Left nephrostogram was performed. Catheter was removed over a superstiff Amplatz wire and a new 10 [place] multipurpose drain was reconstituted in the renal pelvis. Skin was anesthetized using 1% lidocaine. Catheter was sutured to skin. Catheter was flushed and capped. FINDINGS: New catheter was easily advanced into the left renal collecting system. Contrast injection demonstrated that the left ureter is widely patent with contrast draining into the urinary bladder. IMPRESSION: 1. Successful exchange of the left percutaneous  nephrostomy tube. 2. Left renal collecting system is widely patent. Contrast rapidly drains through the left ureter into the bladder. Due to the patent left ureter, the nephrostomy tube was capped. The left nephrostomy tube may be able to be removed in the near future depending on the cause of the patient's  elevated creatinine. Electronically Signed   By: Markus Daft M.D.   On: 08/07/2021 11:48   DG CHEST PORT 1 VIEW  Result Date: 08/18/2021 CLINICAL DATA:  Respiratory failure EXAM: PORTABLE CHEST 1 VIEW COMPARISON:  08/17/2021 FINDINGS: Previous median sternotomy and mitral valve replacement. Pacemaker/AICD in place. Slight further worsening of diffuse pulmonary density consistent with edema. Enlarging effusions, right more than left. IMPRESSION: Worsening of effusions and diffuse pulmonary density most consistent with congestive heart failure. Electronically Signed   By: Nelson Chimes M.D.   On: 08/18/2021 08:06   DG CHEST PORT 1 VIEW  Result Date: 08/17/2021 CLINICAL DATA:  Acute respiratory failure with hypoxia EXAM: PORTABLE CHEST 1 VIEW COMPARISON:  08/16/2021 FINDINGS: Shallow inspiration with low lung volumes. Increased bilateral opacities. Probable small bilateral pleural effusions. Similar cardiomediastinal contours with cardiomegaly. Left chest wall ICD. Valve replacement. IMPRESSION: Increased bilateral opacities likely reflecting edema. Probable small bilateral pleural effusions. Electronically Signed   By: Macy Mis M.D.   On: 08/17/2021 08:18   DG CHEST PORT 1 VIEW  Result Date: 08/15/2021 CLINICAL DATA:  Hypoxia. History of CHF, diabetes, hypertension, endocarditis. EXAM: PORTABLE CHEST 1 VIEW COMPARISON:  Chest radiograph 08/12/2021 FINDINGS: Left chest wall AICD with leads overlying the right atrium and right ventricle. Postoperative changes of median sternotomy, CABG, and mitral valve replacement. Low lung volumes with diffuse patchy and interstitial airspace opacities, mildly  increased compared to 08/12/2021. Moderate layering right pleural effusion with probable loculated lateral component. Small left pleural effusion. Thin linear lucency noted at the right apex measures 0.4 cm, raising suspicion for possible pneumothorax. No findings of tension effect. Heart size is difficult to evaluate due to adjacent consolidations but appears enlarged, stable. Aortic calcifications. IMPRESSION: 1. Thin linear lucency at the right apex raises suspicion for possible pneumothorax without tension effect. Critical Value/emergent results were called by telephone at the time of interpretation on 08/15/2021 at 11:43 am to provider RALPH NETTEY , who verbally acknowledged these results. 2. Increased diffuse interstitial and patchy airspace opacities as well as moderate right and small left pleural effusions, favored to represent worsening pulmonary edema though superimposed infection can not be excluded. 3.  Aortic Atherosclerosis (ICD10-I70.0). Electronically Signed   By: Ileana Roup M.D.   On: 08/15/2021 11:44   DG CHEST PORT 1 VIEW  Result Date: 08/12/2021 CLINICAL DATA:  Pleural effusion. EXAM: PORTABLE CHEST 1 VIEW COMPARISON:  July 01, 2021. FINDINGS: Stable cardiomegaly. Status post mitral valve repair. Left-sided fibrillator is unchanged in position. Increased right pleural effusion is noted which most likely is loculated. Increased right lung opacity is noted concerning for edema, atelectasis or possibly infiltrate. Increased left basilar atelectasis or infiltrate is noted with probable small pleural effusion. Bony thorax is unremarkable. IMPRESSION: Increased bilateral lung opacities are noted, right greater than left, with associated pleural effusions. Electronically Signed   By: Marijo Conception M.D.   On: 08/12/2021 08:13   DG Abd Portable 1V  Result Date: 08/12/2021 CLINICAL DATA:  Abdominal pain EXAM: PORTABLE ABDOMEN - 1 VIEW COMPARISON:  [facility] 04/29/2021, CT abdomen/pelvis 1 day  prior FINDINGS: There is a nonobstructive bowel gas pattern. A left-sided nephrostomy tube is seen coiled over the left hemiabdomen. There is no abnormal soft tissue calcification. There is no definite free intraperitoneal air, suboptimally evaluated given supine technique. There is no acute osseous abnormality. A cardiac device leads, median sternotomy wires, and cardiac valve prosthesis are noted in the chest. There are bilateral pleural effusions with left basilar  airspace disease, better assessed on the same day chest radiograph. IMPRESSION: Nonobstructive bowel gas pattern. No other acute finding in the abdomen. Electronically Signed   By: Valetta Mole M.D.   On: 08/12/2021 11:01   CT RENAL STONE STUDY  Result Date: 08/11/2021 CLINICAL DATA:  History of acute renal failure in this 76 year old male. EXAM: CT ABDOMEN AND PELVIS WITHOUT CONTRAST TECHNIQUE: Multidetector CT imaging of the abdomen and pelvis was performed following the standard protocol without IV contrast. COMPARISON:  July 02, 2021. FINDINGS: Lower chest: Since the previous exam there has been interval development of large RIGHT and moderate LEFT pleural effusions. Basilar airspace disease is present bilaterally. Heart size is enlarged with signs of mitral valve replacement. No visible pericardial effusion. Heart is incompletely imaged. Cardiac leads in the RIGHT heart are partially evaluated. Low-attenuation of cardiac blood pool compatible with anemia. Hepatobiliary: Hyperdense liver it at 81 Hounsfield units. The under distended gallbladder. No gross signs of inflammation. Cholelithiasis. Pancreas: Pancreatic atrophy. Areas of low attenuation in the pancreatic head are grossly stable but not well evaluated in the setting of anasarca. No gross signs of pancreatic inflammation. Spleen: Normal in size and contour. Adrenals/Urinary Tract: Adrenal glands are normal. Signs of LEFT-sided nephrostomy tube placement. Marker for sideholes at the  level of the LEFT renal pelvis well within the expected location of the collecting systems and not associated with hydronephrosis or substantial perinephric stranding. No RIGHT-sided hydronephrosis. Urinary bladder with small amount of gas in the anterior urinary bladder. Smooth contour of the urinary bladder. Stomach/Bowel: Large volume of stool in the rectum slightly diminished compared to previous imaging but still substantial. No signs of obstruction related to this process. Scattered stool and gas throughout the remainder of the colon. Stomach under distended limiting assessment. No signs of small bowel distension. Small bowel floats in small volume ascites. Ascites has developed since the previous study and is associated with generalized anasarca. Vascular/Lymphatic: Aortic atherosclerosis. No aneurysmal dilation. No adenopathy in the abdomen or pelvis. Reproductive: Unremarkable by CT. Other: No free air.  Small volume ascites. Hematoma in the LEFT retroperitoneum continues to diminished in size measuring approximately 5.7 x 3.2 cm greatest axial dimension, when measured at a similar level and location approximately 8.4 x 3.1 cm on the previous study. Musculoskeletal: Diffuse body wall edema. No acute or destructive bone findings. Degenerative changes in the hips and spine. Multilevel endplate compression fractures in the lumbar spine with similar appearance. Most notably at the L2, L3 and L5 levels IMPRESSION: Interval development of anasarca with pleural effusions, body wall edema and ascites. Associated basilar volume loss in the setting of large RIGHT and moderate LEFT effusions. LEFT nephrostomy tube in place, no hydronephrosis. Hematoma in the LEFT retroperitoneum continues to diminished in size. Hyperdense liver, nonspecific but can be seen in the setting of amiodarone therapy or iron deposition. Cholelithiasis without gross signs of inflammation. Large volume of stool in the rectum slightly diminished  compared to previous imaging but still substantial. No signs of obstruction related to this process. Gas in the urinary bladder as before, correlate with any instrumentation or with urinalysis as appropriate. Multilevel endplate compression fractures in the lumbar spine with similar appearance. Aortic Atherosclerosis (ICD10-I70.0). Electronically Signed   By: Zetta Bills M.D.   On: 08/11/2021 15:58   US THORACENTESIS ASP PLEURAL SPACE W/IMG GUIDE  Result Date: 08/16/2021 INDICATION: Shortness of breath. Large right pleural effusion. Request diagnostic and therapeutic thoracentesis. EXAM: ULTRASOUND GUIDED RIGHT THORACENTESIS MEDICATIONS: 1% plain lidocaine,  5 mL COMPLICATIONS: None immediate. PROCEDURE: An ultrasound guided thoracentesis was thoroughly discussed with the patient and questions answered. The benefits, risks, alternatives and complications were also discussed. The patient understands and wishes to proceed with the procedure. Written consent was obtained. Ultrasound was performed to localize and mark an adequate pocket of fluid in the right chest. The area was then prepped and draped in the normal sterile fashion. 1% Lidocaine was used for local anesthesia. Under ultrasound guidance a 6 Fr Safe-T-Centesis catheter was introduced. Thoracentesis was performed. The catheter was removed and a dressing applied. FINDINGS: A total of approximately 1.3 L of clear, dark yellow fluid was removed. Samples were sent to the laboratory as requested by the clinical team. IMPRESSION: Successful ultrasound guided right thoracentesis yielding 1.3 L of pleural fluid. Read by: Ascencion Dike PA-C Electronically Signed   By: Sandi Mariscal M.D.   On: 08/16/2021 09:41    Microbiology: Recent Results (from the past 240 hour(s))  Gram stain     Status: None   Collection Time: 08/16/21 10:30 AM   Specimen: Fluid  Result Value Ref Range Status   Specimen Description FLUID  Final   Special Requests  PLEURAL, RIGHT   Final   Gram Stain   Final    WBC PRESENT,BOTH PMN AND MONONUCLEAR NO ORGANISMS SEEN CYTOSPIN SMEAR Performed at [facility], [address] [place], [place] [address]    Report Status 08/17/2021 FINAL  Final  Culture, body fluid w Gram Stain-bottle     Status: None   Collection Time: 08/16/21 10:30 AM   Specimen: Fluid  Result Value Ref Range Status   Specimen Description FLUID  Final   Special Requests  PLEURAL, RIGHT  Final   Culture   Final    NO GROWTH 5 DAYS Performed at [facility], [place] [address] [place], [place] [address]    Report Status 08/21/2021 FINAL  Final  MRSA Next Gen by PCR, Nasal     Status: Abnormal   Collection Time: 08/16/21  6:35 PM   Specimen: Nasal Mucosa; Nasal Swab  Result Value Ref Range Status   MRSA by PCR Next Gen DETECTED (A) NOT DETECTED Final    Comment: RESULT CALLED TO, READ BACK BY AND VERIFIED WITH: GRIFFIN,C. 08/16/21 @2241  BY SEEL,M. (NOTE) The GeneXpert MRSA Assay (FDA approved for NASAL specimens only), is one component of a comprehensive MRSA colonization surveillance program. It is not intended to diagnose MRSA infection nor to guide or monitor treatment for MRSA infections. Test performance is not FDA approved in patients less than 34 years old. Performed at [facility], [place] [address], [place],  [address]      Labs: Basic Metabolic Panel: Recent Labs  Lab 08/18/21 0306  NA 137  K 4.7  CL 107  CO2 20*  GLUCOSE 100*  BUN 56*  CREATININE 3.02*  CALCIUM 8.1*   Liver Function Tests: No results for input(s): AST, ALT, ALKPHOS, BILITOT, PROT, ALBUMIN in the last 168 hours. No results for input(s): LIPASE, AMYLASE in the last 168 hours. No results for input(s): AMMONIA in the last 168 hours. CBC: No results for input(s): WBC, NEUTROABS, HGB, HCT, MCV, PLT in the last 168 hours. Cardiac Enzymes: No results for input(s): CKTOTAL, CKMB, CKMBINDEX, TROPONINI in the last 168  hours. D-Dimer No results for input(s): DDIMER in the last 72 hours. BNP: Invalid input(s): POCBNP CBG: Recent Labs  Lab 08/18/21 0918 08/18/21 1136 08/18/21 1738 08/18/21 2115  08/19/21 0754  GLUCAP 94 105* 118* 94 81   Anemia work up No results for input(s): VITAMINB12, FOLATE, FERRITIN, TIBC, IRON, RETICCTPCT in the last 72 hours. Urinalysis    Component Value Date/Time   COLORURINE AMBER (A) 08/14/2021 1515   APPEARANCEUR TURBID (A) 08/14/2021 1515   LABSPEC 1.019 08/14/2021 1515   PHURINE 5.0 08/14/2021 1515   GLUCOSEU NEGATIVE 08/14/2021 1515   HGBUR LARGE (A) 08/14/2021 1515   BILIRUBINUR NEGATIVE 08/14/2021 1515   KETONESUR NEGATIVE 08/14/2021 1515   PROTEINUR 100 (A) 08/14/2021 1515   UROBILINOGEN 1.0 09/12/2012 2055   NITRITE NEGATIVE 08/14/2021 1515   LEUKOCYTESUR LARGE (A) 08/14/2021 1515   Sepsis Labs Invalid input(s): PROCALCITONIN,  WBC,  LACTICIDVEN     SIGNED:  Georgette Shell, MD  [facility] 08/24/2021, 4:21 PM

## 2021-09-20 NOTE — Progress Notes (Signed)
09-05-21  On-going and off-going nurses were called to the patient's room during shift report as family members noted patient had stopped breathing. Patient had ongoing cardiac rhythm with widening QRS complex at a rate of 60. Over the next 15 minutes, patient had ongoing occasional respirations and steadily decreasing heart rate.   With absence of auscultated heart sounds noted by Polly Cobia RN and Clarene Critchley RN, patient's time of death was pronounced at 1925 hours. On-call provider for [facility], Lovey Newcomer APP, was notified of the patient's death.  All belongings previously removed by family members. Dentures were left in patient's mouth for transport.  Per instructions from [employer] to charge nurse, Barnet Glasgow RN, patient's eyes were covered with saline moistened gauze as potential eye donor.  Cindy S. Brigitte Pulse BSN, RN, [place] 09-05-21 9:55 PM

## 2021-09-20 NOTE — Progress Notes (Signed)
PROGRESS NOTE    Ryan [name]  AOZ:[id number] DOB: 10/11/1945 DOA: 07/23/2021 PCP: [name], MD   Brief Narrative: Ryan [name] is a 76 y.o. male with a history of HFrEF and EF of 25% status post ICD, hypertension, diabetes mellitus type 2, peripheral artery disease, bilateral AKA's, DVT/PE, A. fib on Eliquis, CABG.  Patient presented secondary to dislodged nephrostomy tube, status postexchange.  Patient also noted to have evidence of volume overload and resultant AKI.  Patient started on IV diuresis and now with decreased urine output.  Assessment & Plan:   Principal Problem:   AKI (acute kidney injury) ([place]) Active Problems:   AF (paroxysmal atrial fibrillation) (HCC)   Type 2 diabetes mellitus (HCC)   Pressure ulcer   DVT of left external iliac vein 04/2021   Pulmonary emboli 04/2021   Hematuria   History of venous thromboembolism   Chronic hypotension   Dyslipidemia   S/P AKA (above knee amputation) bilateral (HCC)   Elevated LFTs   Bilateral pleural effusion   Acute on chronic systolic CHF (congestive heart failure) (HCC)   Elevated transaminase level   Acute respiratory failure with hypoxia ([place])   #1 AKI renal ultrasound with no hydronephrosis.  Patient now comfort care.  #2 CHF acute on chronic systolic with AICD in place to be turned off today appreciate cardiology assistance.  #3 acute hypoxic respiratory failure due to CHF/end-stage heart failure  #4 hypotension chronic   #5 PE   #6 DVT  was on Eliquis  #7 pressure ulcer bilateral buttocks present on admission  #8 type 2 diabetes on insulin  #9 paroxysmal atrial fibrillation was on amiodarone and Eliquis  #10 malfunction of nephrostomy tube  #11 goals of care appreciate palliative care input patient now full comfort care and DNR.  Pressure Injury 08/07/21 Buttocks Right;Left Stage 2 -  Partial thickness loss of dermis presenting as a shallow open injury with a red, pink wound bed without  slough. (Active)  08/07/21 1949  Location: Buttocks  Location Orientation: Right;Left  Staging: Stage 2 -  Partial thickness loss of dermis presenting as a shallow open injury with a red, pink wound bed without slough.  Wound Description (Comments):   Present on Admission: Yes     Estimated body mass index is 43.12 kg/m as calculated from the following:   Height as of this encounter: 4' (1.219 m).   Weight as of this encounter: 64.1 kg.  DVT prophylaxis: Comfort care Code Status: DNR Family Communication: None at bedside  disposition Plan:  Status is: Inpatient  Remains inpatient appropriate because: ?  Anticipated hospital death    Consultants:  Cardiology, palliative care, renal  Procedures: Antimicrobials: None  Subjective: UNRESPONSIVE ON NRB  Objective: Vitals:   September 15, 2021 0430 September 15, 2021 0605 09-15-21 0713 15-Sep-2021 0752  BP:  (!) 90/55  (!) 72/44  Pulse: 72  74   Resp: 15 11 17 12   Temp:      TempSrc:      SpO2: 95%  95%   Weight:      Height:        Intake/Output Summary (Last 24 hours) at 15-Sep-2021 1031 Last data filed at 09-15-21 0800 Gross per 24 hour  Intake 31.74 ml  Output 200 ml  Net -168.26 ml    Filed Weights   08/17/21 0500 08/18/21 0500 08/19/21 0500  Weight: 63.1 kg 63.1 kg 64.1 kg    Examination:  General exam: Appears calm and comfortable  Respiratory system: Clear to  auscultation. Respiratory effort normal. Cardiovascular system: S1 & S2 heard, RRR. No JVD, murmurs, rubs, gallops or clicks. No pedal edema. Gastrointestinal system: Abdomen is nondistended, soft and nontender. No organomegaly or masses felt. Normal bowel sounds heard. Central nervous system: unresponsive Extremities:  trace edema  Skin: No rashes, lesions or ulcers Psychiatry: unresponsive   Data Reviewed: I have personally reviewed following labs and imaging studies  CBC: Recent Labs  Lab 08/14/21 0949  WBC 8.0  HGB 10.1*  HCT 32.8*  MCV 72.4*  PLT 205     Basic Metabolic Panel: Recent Labs  Lab 08/14/21 0949 08/15/21 0857 08/16/21 0526 08/17/21 0308 08/18/21 0306  NA 135 137 135 136 137  K 4.9 5.3* 5.0 4.9 4.7  CL 108 108 107 107 107  CO2 19* 18* 19* 20* 20*  GLUCOSE 109* 80 79 68* 100*  BUN 45* 49* 52* 54* 56*  CREATININE 2.32* 2.66* 2.92* 3.21* 3.02*  CALCIUM 8.0* 8.1* 8.1* 7.9* 8.1*    GFR: Estimated Creatinine Clearance: 11.3 mL/min (A) (by C-G formula based on SCr of 3.02 mg/dL (H)). Liver Function Tests: Recent Labs  Lab 08/15/21 0857 08/16/21 0526 08/16/21 1622 08/17/21 0308  AST 442* 495* 500* 422*  ALT 203* 232* 235* 218*  ALKPHOS 163* 173* 165* 159*  BILITOT 1.2 1.3* 1.6* 1.7*  PROT 6.0* 5.9* 5.8* 5.5*  ALBUMIN 2.3* 2.2* 2.0* 1.9*    No results for input(s): LIPASE, AMYLASE in the last 168 hours. No results for input(s): AMMONIA in the last 168 hours. Coagulation Profile: No results for input(s): INR, PROTIME in the last 168 hours. Cardiac Enzymes: No results for input(s): CKTOTAL, CKMB, CKMBINDEX, TROPONINI in the last 168 hours. BNP (last 3 results) Recent Labs    08/25/20 1121 03/27/21 1123  PROBNP 21,395* 3,146*    HbA1C: No results for input(s): HGBA1C in the last 72 hours. CBG: Recent Labs  Lab 08/18/21 0918 08/18/21 1136 08/18/21 1738 08/18/21 2115 08/19/21 0754  GLUCAP 94 105* 118* 94 81    Lipid Profile: No results for input(s): CHOL, HDL, LDLCALC, TRIG, CHOLHDL, LDLDIRECT in the last 72 hours. Thyroid Function Tests: No results for input(s): TSH, T4TOTAL, FREET4, T3FREE, THYROIDAB in the last 72 hours. Anemia Panel: No results for input(s): VITAMINB12, FOLATE, FERRITIN, TIBC, IRON, RETICCTPCT in the last 72 hours. Sepsis Labs: No results for input(s): PROCALCITON, LATICACIDVEN in the last 168 hours.  Recent Results (from the past 240 hour(s))  Urine Culture     Status: None   Collection Time: 08/14/21 12:56 PM   Specimen: In/Out Cath Urine  Result Value Ref Range  Status   Specimen Description   Final    IN/OUT CATH URINE Performed at [facility] [address], [place], [place] [address]    Special Requests   Final    NONE Performed at [facility], [place] [address] [place], [place] [address]    Culture   Final    NO GROWTH Performed at [facility], [place] [address] [place], [place] [address]    Report Status 08/15/2021 FINAL  Final  Gram stain     Status: None   Collection Time: 08/16/21 10:30 AM   Specimen: Fluid  Result Value Ref Range Status   Specimen Description FLUID  Final   Special Requests  PLEURAL, RIGHT  Final   Gram Stain   Final    WBC PRESENT,BOTH PMN AND MONONUCLEAR NO ORGANISMS SEEN CYTOSPIN SMEAR Performed at [facility]  Lab, [name], [place], [place] [address]    Report Status 08/17/2021 FINAL  Final  Culture, body fluid w Gram Stain-bottle     Status: None (Preliminary result)   Collection Time: 08/16/21 10:30 AM   Specimen: Fluid  Result Value Ref Range Status   Specimen Description FLUID  Final   Special Requests  PLEURAL, RIGHT  Final   Culture   Final    NO GROWTH 4 DAYS Performed at [facility], [place] [address], [place], [place] [address]    Report Status PENDING  Incomplete  MRSA Next Gen by PCR, Nasal     Status: Abnormal   Collection Time: 08/16/21  6:35 PM   Specimen: Nasal Mucosa; Nasal Swab  Result Value Ref Range Status   MRSA by PCR Next Gen DETECTED (A) NOT DETECTED Final    Comment: RESULT CALLED TO, READ BACK BY AND VERIFIED WITH: GRIFFIN,C. 08/16/21 @2241  BY SEEL,M. (NOTE) The GeneXpert MRSA Assay (FDA approved for NASAL specimens only), is one component of a comprehensive MRSA colonization surveillance program. It is not intended to diagnose MRSA infection nor to guide or monitor treatment for MRSA infections. Test performance is not FDA approved in patients less than 57 years old. Performed at [facility],  [place] [address], [place], [place] [address]           Radiology Studies: No results found.      Scheduled Meds:  Chlorhexidine Gluconate Cloth  6 each Topical Daily   mouth rinse  15 mL Mouth Rinse BID    morphine injection  2 mg Intravenous Once   Continuous Infusions:  sodium chloride 10 mL/hr at 2021/09/16 0600     LOS: 8 days    Georgette Shell, MD 09/16/2021, 10:31 AM

## 2021-09-20 NOTE — Progress Notes (Signed)
Daily Progress Note   Patient Name: Ryan [name]       Date: Aug 23, 2021 DOB: April 02, 1946  Age: 76 y.o. MRN#: [id number] Attending Physician: Georgette Shell, MD Primary Care Physician: [name], MD Admit Date: 07/21/2021  Reason for Consultation/Follow-up: Establishing goals of care  Subjective: I saw and examined [name] today.    He was lying in bed in no distress.  He is actively dying.  He had multiple family at the bedside today.  Length of Stay: 8  Current Medications: Scheduled Meds:   Chlorhexidine Gluconate Cloth  6 each Topical Daily   mouth rinse  15 mL Mouth Rinse BID    morphine injection  2 mg Intravenous Once    Continuous Infusions:  sodium chloride 10 mL/hr at 08/23/21 0600    PRN Meds: sodium chloride, alum & mag hydroxide-simeth, bisacodyl, glycopyrrolate, guaiFENesin, haloperidol lactate, HYDROmorphone (DILAUDID) injection, lip balm, LORazepam, [DISCONTINUED] ondansetron **OR** ondansetron (ZOFRAN) IV  Physical Exam   General: Somnolent Heart: Regular rate and rhythm. No murmur appreciated. Lungs: Decreased air movement, Rales Abdomen: Soft, nontender, nondistended, positive bowel sounds.   Skin: Warm and dry        Vital Signs: BP (!) 72/44   Pulse 74   Temp 98.2 F (36.8 C) (Axillary)   Resp 14   Ht 4' (1.219 m)   Wt 64.1 kg   SpO2 95%   BMI 43.12 kg/m  SpO2: SpO2: 95 % O2 Device: O2 Device: High Flow Nasal Cannula O2 Flow Rate: O2 Flow Rate (L/min): 15 L/min  Intake/output summary:  Intake/Output Summary (Last 24 hours) at 08/23/2021 1319 Last data filed at Aug 23, 2021 0800 Gross per 24 hour  Intake 31.74 ml  Output 200 ml  Net -168.26 ml    LBM: Last BM Date: 08/11/21 Baseline Weight: Weight: 65.3 kg Most recent  weight: Weight: 64.1 kg       Palliative Assessment/Data:    Flowsheet Rows    Flowsheet Row Most Recent Value  Intake Tab   Referral Department Hospitalist  Unit at Time of Referral ICU  Palliative Care Primary Diagnosis Cardiac  Date Notified 08/16/21  Palliative Care Type New Palliative care  Reason for referral Clarify Goals of Care  Date of Admission 07/29/2021  Date first seen by Palliative Care 08/18/21  # of days  Palliative referral response time 2 Day(s)  # of days IP prior to Palliative referral 10  Clinical Assessment   Palliative Performance Scale Score 30%  Psychosocial & Spiritual Assessment   Palliative Care Outcomes   Patient/Family meeting held? Yes  Who was at the meeting? Son, daughter, daughter-in-law, sister       Patient Active Problem List   Diagnosis Date Noted   Acute respiratory failure with hypoxia ([place]) 08/17/2021   Elevated transaminase level 08/13/2021   Acute on chronic systolic CHF (congestive heart failure) ([place]) 08/12/2021   Bilateral pleural effusion 08/11/2021   S/P AKA (above knee amputation) bilateral ([place]) 08/10/2021   Elevated LFTs 08/10/2021   Hematuria 08/08/2021   History of venous thromboembolism 08/08/2021   Chronic hypotension 08/08/2021   Dyslipidemia 08/08/2021   Altered mental status 07/01/2021   Multiple pulmonary nodules 93/23/5573   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[place]) 06/06/2021   Normocytic anemia 06/06/2021   DVT of left external iliac vein 04/2021 06/06/2021   Pulmonary emboli 04/2021 06/06/2021   Pressure ulcer 04/22/2021   Popliteal artery occlusion, right ([place]) 03/06/2021   PAD (peripheral artery disease) ([place]) 03/06/2021   Acute on chronic combined systolic and diastolic CHF (congestive heart failure) ([place]) 09/06/2020   CHF (congestive heart failure) ([place]) 08/26/2020   Acute combined systolic and diastolic CHF, NYHA class 3 ([place])    Stage 3 chronic kidney disease ([place]) - baseline SCr 1.1    Onychomycosis of  toenail 04/23/2020   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   PVC's (premature ventricular contractions) 06/12/2016   Junctional tachycardia ([place]) 09/16/2015   AKI (acute kidney injury) ([place]) 08/30/2015   SVT (supraventricular tachycardia)  long RP    Atherosclerosis of native arteries of the extremities with ulceration ([place]) 10/23/2012   Essential hypertension 09/29/2012   Type 2 diabetes mellitus ([place]) 09/28/2012   Status post mitral valve replacement with bioprosthetic valve 09/19/2012   AF (paroxysmal atrial fibrillation) ([place]) 22/10/5425   Chronic systolic heart failure (HCC) - LVEF 25 to 30% 09/16/2012   Peripheral vascular disease ([place]) 09/15/2012   Sepsis with acute renal failure without septic shock ([place]) 09/15/2012   Acute ischemic stroke ([place]) 09/15/2012    Palliative Care Assessment & Plan   Patient Profile: 76 y.o. male  with past medical history of Heart failure with reduced EF of 25% status post ICD, hypertension, diabetes, peripheral artery disease, bilateral AKA's, DVT, A. fib on Eliquis, CABG admitted on 08/05/2021 with acute on chronic congestive heart failure complicated by bilateral pleural effusions with respiratory failure with hypoxia and AKI.  His situation has remained difficult due to low blood pressure and poor candidate for dialysis.  Palliative consulted for goals of care.  Recommendations/Plan: Continue plan for full comfort. Symptoms currently well managed.  Plan to continue same regimen.  Goals of Care and Additional Recommendations: Limitations on Scope of Treatment: Full Comfort Care  Code Status:    Code Status Orders  (From admission, onward)           Start     Ordered   08/18/21 1914  Do not attempt resuscitation (DNR)  Continuous       Question Answer Comment  In the event of cardiac or respiratory ARREST Do not call a "code blue"   In the event of cardiac or respiratory ARREST Do not perform Intubation, CPR,  defibrillation or ACLS   In the event of cardiac or respiratory ARREST Use medication by any route, position, wound care, and other measures  to relive pain and suffering. May use oxygen, suction and manual treatment of airway obstruction as needed for comfort.      08/18/21 1913           Code Status History     Date Active Date Inactive Code Status Order ID Comments User Context   08/07/2021 0129 08/18/2021 1913 Full Code [id number]  Etta Quill, DO ED   07/01/2021 2342 07/15/2021 0245 Full Code [id number]  Kristopher Oppenheim, DO Inpatient   06/06/2021 2206 06/08/2021 0445 Full Code [id number]  Reubin Milan, MD ED   04/08/2021 1212 05/23/2021 0857 Full Code [id number]  Karmen Bongo, MD Inpatient   03/06/2021 1816 03/13/2021 0325 Full Code [id number]  Vaughan Basta [address], PA-C Inpatient   02/25/2021 1202 02/25/2021 2000 Full Code [id number]  Cherre Robins, MD Inpatient   01/30/2021 1513 01/30/2021 2320 Full Code [id number]  Larey Dresser, MD Inpatient   08/26/2020 1818 09/06/2020 2203 Full Code [id number]  Ledora Bottcher, [place] Inpatient   06/11/2016 1627 06/12/2016 1608 Full Code [id number]  Constance Haw, MD Inpatient   08/30/2015 0114 08/30/2015 1540 Full Code [id number]  Edwin Dada, MD Inpatient   09/29/2012 1803 10/06/2012 2104 Full Code [id number]  Cephus Richer, RN Inpatient   09/26/2012 1331 09/29/2012 1803 Full Code [id number]  Lenox Ahr, RN Inpatient       Prognosis:  Hours - Days  Discharge Planning: Anticipated Hospital Death  Care plan was discussed with family, bedside RN  Thank you for allowing the Palliative Medicine Team to assist in the care of this patient.   Total Time 20 Prolonged Time Billed No   Greater than 50%  of this time was spent counseling and coordinating care related to the above assessment and plan.  Micheline Rough, MD  Please contact Palliative Medicine Team phone at [phone] for questions and concerns.

## 2021-09-20 NOTE — TOC Progression Note (Signed)
Transition of Care [facility]) - Progression Note    Patient Details  Name: Ryan [name] MRN: [id number] Date of Birth: 1946/03/15  Transition of Care [facility]) CM/SW Contact  Leeroy Cha, RN Phone Number: 2021-09-13, 9:52 AM  Clinical Narrative:    TOC PLAN OF CARE: Following for hospital course support to family. No other toc needs present.   Expected Discharge Plan: Skilled Nursing Facility Barriers to Discharge: Other (must enter comment) (insurance authorization)  Expected Discharge Plan and Services Expected Discharge Plan: [place]   Discharge Planning Services: CM Consult   Living arrangements for the past 2 months: [place]                                       Social Determinants of Health (SDOH) Interventions    Readmission Risk Interventions Readmission Risk Prevention Plan 07/09/2021 04/15/2021  Transportation Screening Complete -  PCP or Specialist Appt within 5-7 Days Complete -  Home Care Screening Complete Complete  Medication Review (RN CM) Complete Complete  Some recent data might be hidden

## 2021-09-20 DEATH — deceased

## 2022-05-08 IMAGING — DX DG CHEST 1V PORT
1 series · 1 of 1 positions shown · non-contrast
Comparison: Chest radiograph 08/12/2021

CLINICAL DATA: Hypoxia. History of CHF, diabetes, hypertension,
endocarditis.

EXAM:
PORTABLE CHEST 1 VIEW

[chest ap]
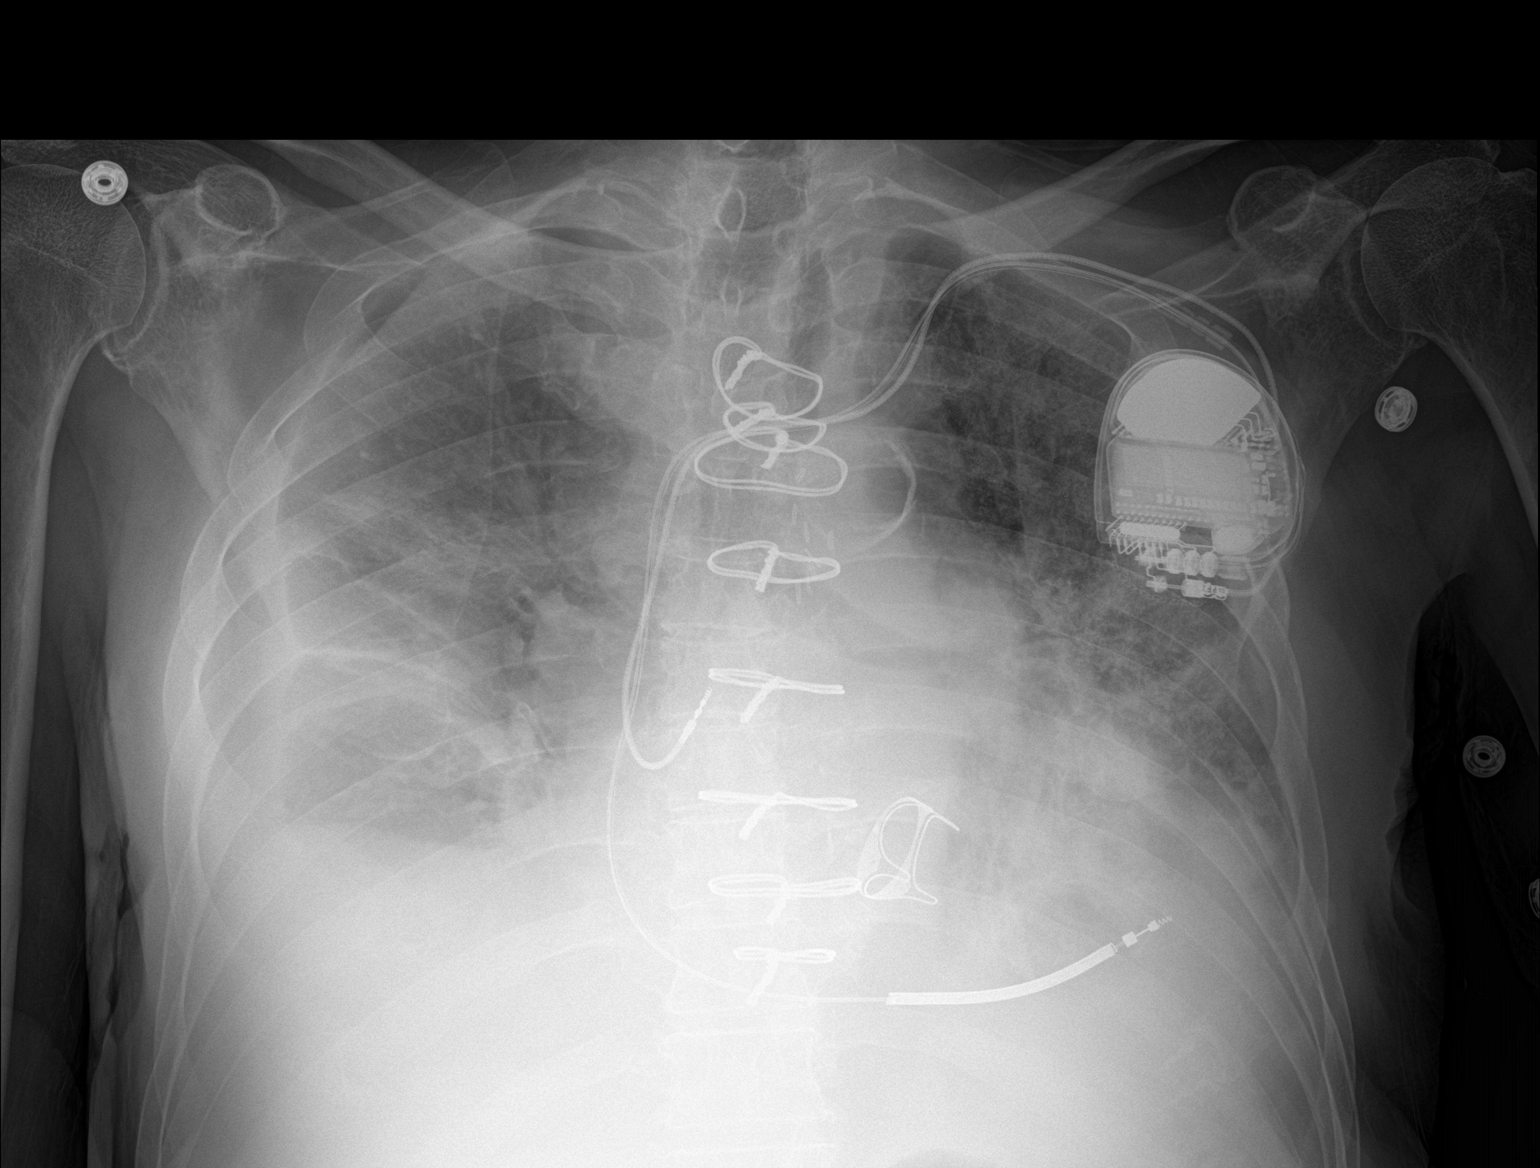

[1 of 1 positions shown; findings below may reference images not displayed]

FINDINGS: Left chest wall AICD with leads overlying the right atrium and right
ventricle. Postoperative changes of median sternotomy, CABG, and
mitral valve replacement.

Low lung volumes with diffuse patchy and interstitial airspace
opacities, mildly increased compared to 08/12/2021. Moderate
layering right pleural effusion with probable loculated lateral
component. Small left pleural effusion. Thin linear lucency noted at
the right apex measures 0.4 cm, raising suspicion for possible
pneumothorax. No findings of tension effect.

Heart size is difficult to evaluate due to adjacent consolidations
but appears enlarged, stable. Aortic calcifications.
IMPRESSION: 1. Thin linear lucency at the right apex raises suspicion for
possible pneumothorax without tension effect. Critical
Value/emergent results were called by telephone at the time of
interpretation on 08/15/2021 at [DATE] to provider LIZASAN ENGERER ,
who verbally acknowledged these results.
2. Increased diffuse interstitial and patchy airspace opacities as
well as moderate right and small left pleural effusions, favored to
represent worsening pulmonary edema though superimposed infection
can not be excluded.
3.  Aortic Atherosclerosis ([id number]).

## 2022-05-08 IMAGING — CT CT CHEST W/O CM
3 of 6 series · 15 of 36 positions shown, 18 images · non-contrast
Comparison: Earlier same day radiograph at 6070 hours.

CLINICAL DATA: Pleural effusion.

EXAM:
CT CHEST WITHOUT CONTRAST
TECHNIQUE: Multidetector CT imaging of the chest was performed following the
standard protocol without IV contrast.

[Series 3: thorax · axial · 0.81mm/px · z∈[+1353,+1593]mm · 9 of 151 slices shown, 12 images]
[im 16/151  mediastinal]
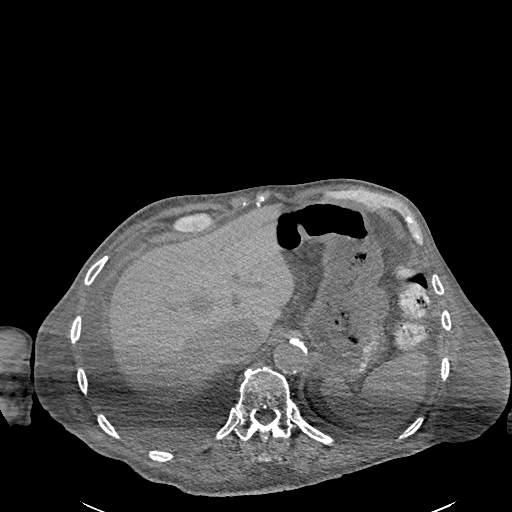
[im 16/151  lung]
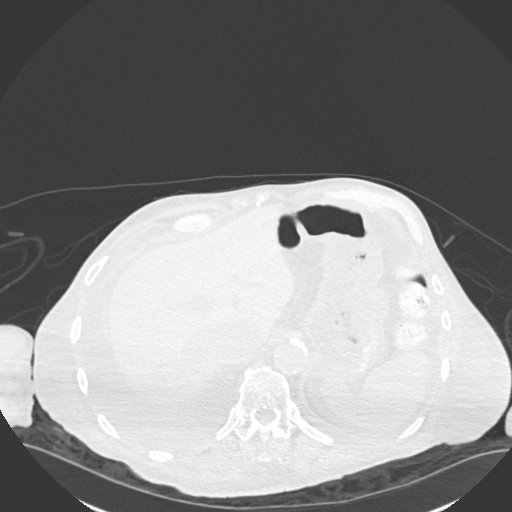
[im 31/151  lung]
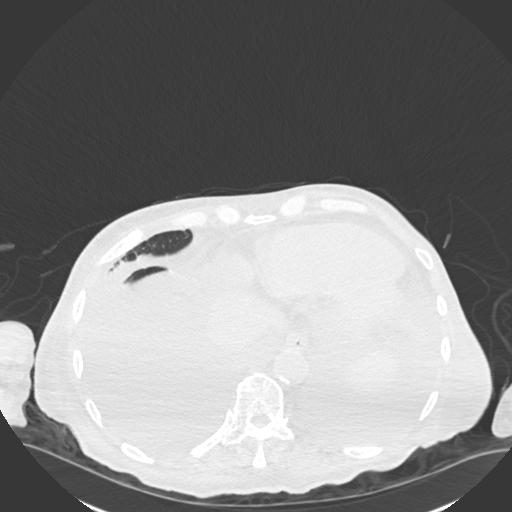
[im 46/151  lung]
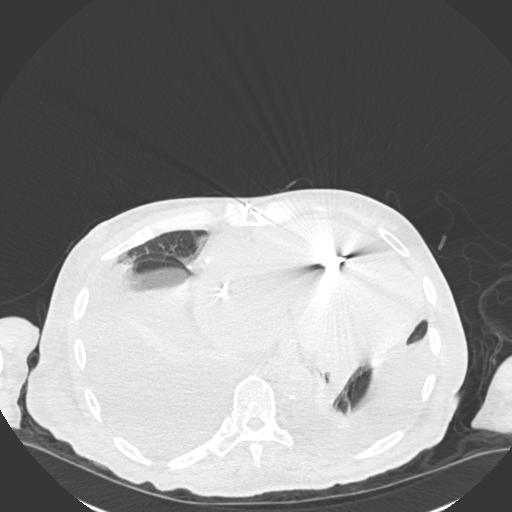
[im 61/151  lung]
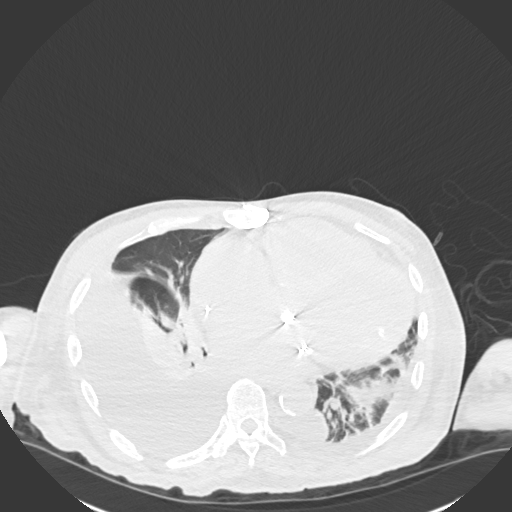
[im 76/151  mediastinal]
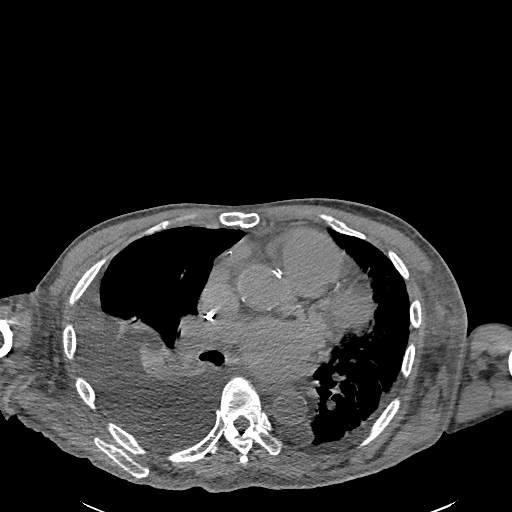
[im 76/151  lung]
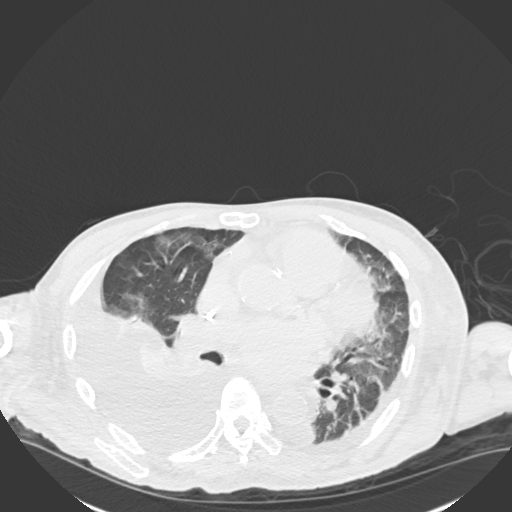
[im 91/151  lung]
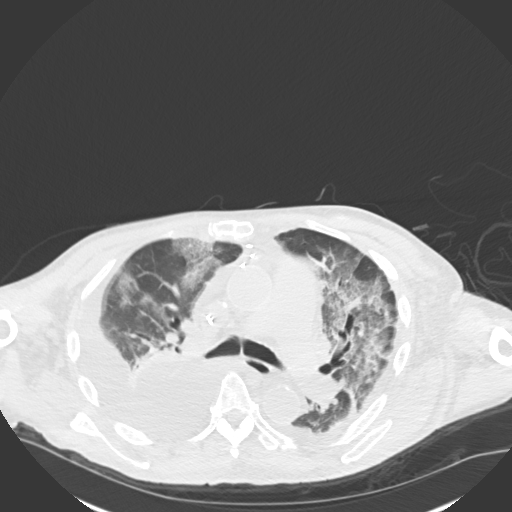
[im 106/151  lung]
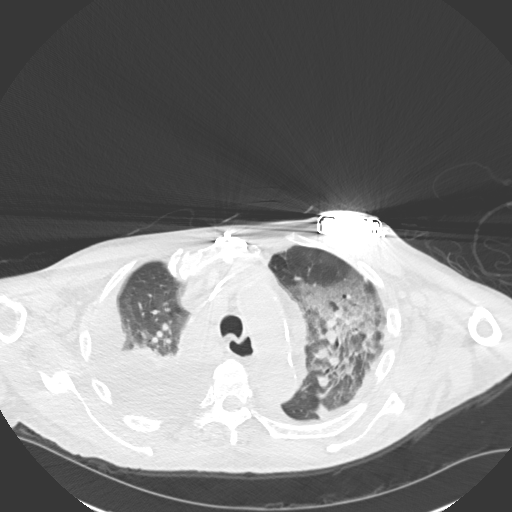
[im 121/151  lung]
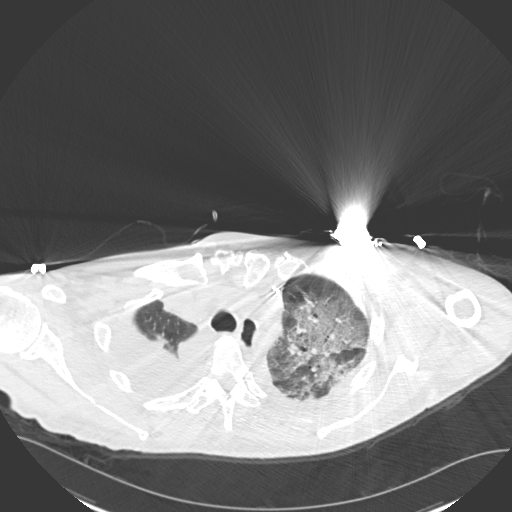
[im 136/151  mediastinal]
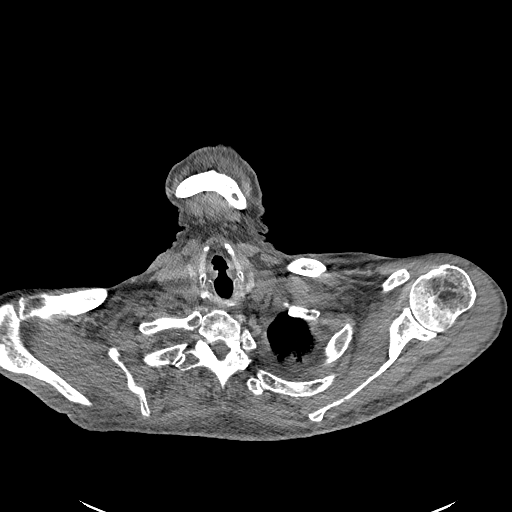
[im 136/151  lung]
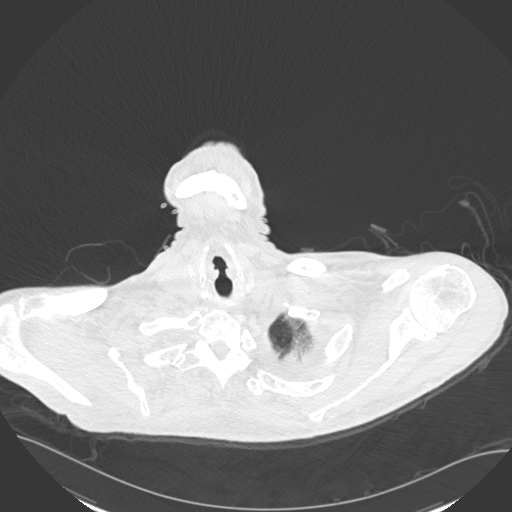

[Series 9: lungs · axial · 0.81mm/px · z∈[+1353,+1413]mm · 3 of 151 slices shown]
[im 16/151  lung]
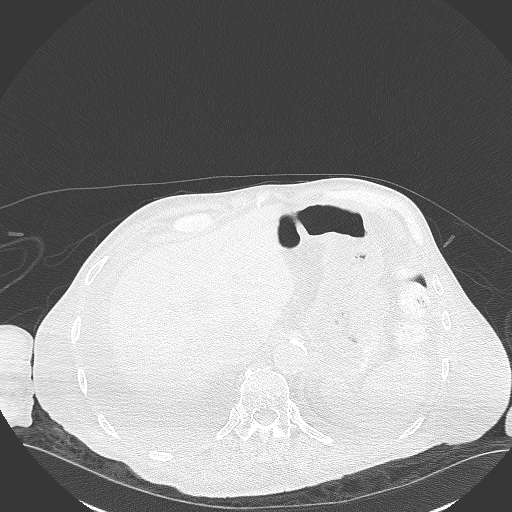
[im 31/151  lung]
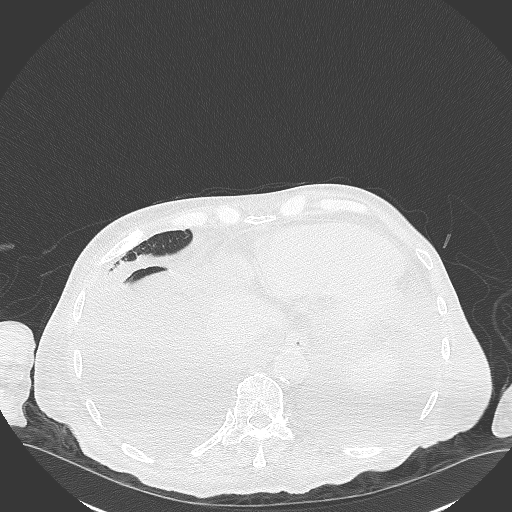
[im 46/151  lung]
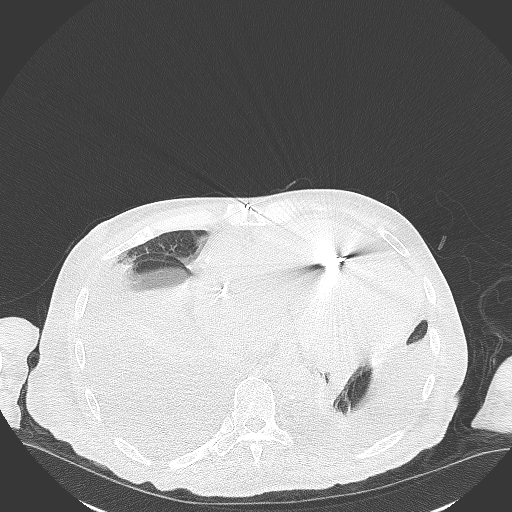

[Series 10: coronal · coronal · 0.59mm/px · 3 of 131 slices shown]
[im 27/131  lung]
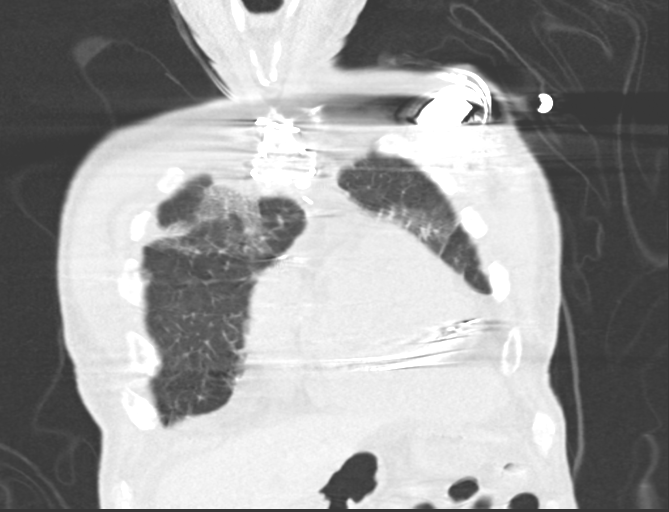
[im 53/131  lung]
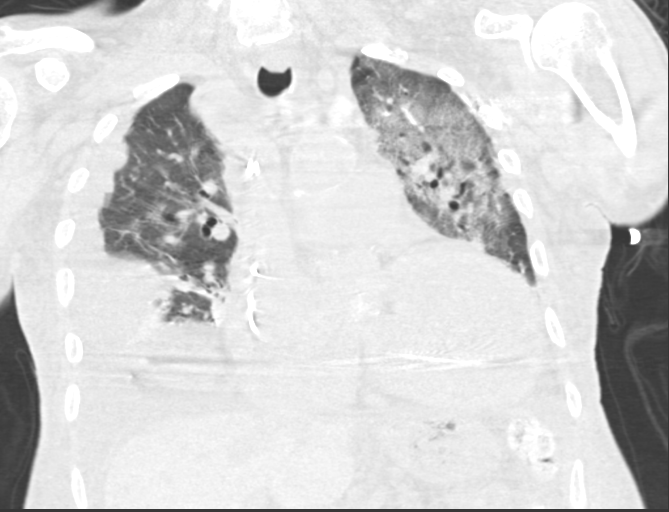
[im 79/131  lung]
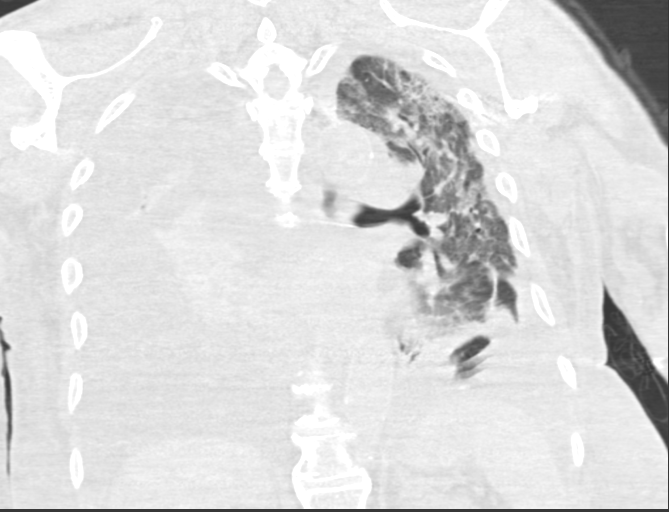

[15 of 36 positions shown; findings below may reference images not displayed]

FINDINGS: Cardiovascular: Moderate cardiomegaly. No pericardial effusion. Left
chest wall AICD leads within the right atrium and right ventricle.
Mitral valve replacement. Postoperative changes of median sternotomy
and coronary artery bypass graft. Scattered calcific atherosclerosis
of the thoracic aorta. Main pulmonary artery measures 3.5 cm in
diameter.

Mediastinum/Nodes: No enlarged mediastinal or axillary lymph nodes.
Thyroid gland, trachea, and esophagus demonstrate no significant
findings.

Lungs/Pleura: No pneumothorax. Large right pleural effusion with
near complete atelectatic collapse of the right lower lobe. Small
left pleural effusion with compressive atelectasis at the left lung
base. Perihilar patchy ground-glass opacities with associated septal
thickening, left-greater-than-right.

Upper Abdomen: No acute abnormality.

Musculoskeletal: No chest wall mass or suspicious bone lesions
identified.
IMPRESSION: 1. No pneumothorax as questioned on earlier same day radiograph.
2. Moderate cardiomegaly with dilatation of the main pulmonary
artery up to 3.5 cm in diameter, suggestive of pulmonary artery
hypertension.
3. Large right and small left pleural effusions. There is near
complete atelectatic collapse of the right lower lobe. No focal
consolidation suggestive of pneumonia.
